# Patient Record
Sex: Male | Born: 1935
Health system: Southern US, Community
[De-identification: ages and names within clinical notes are randomized; demographics above are authoritative.]

## PROBLEM LIST (undated history)

## (undated) ENCOUNTER — Emergency Department (HOSPITAL_COMMUNITY): Admission: EM | Payer: Commercial Managed Care - HMO | Source: Home / Self Care

## (undated) DIAGNOSIS — F329 Major depressive disorder, single episode, unspecified: Secondary | ICD-10-CM

## (undated) DIAGNOSIS — C61 Malignant neoplasm of prostate: Secondary | ICD-10-CM

## (undated) DIAGNOSIS — K219 Gastro-esophageal reflux disease without esophagitis: Secondary | ICD-10-CM

## (undated) DIAGNOSIS — G20A1 Parkinson's disease without dyskinesia, without mention of fluctuations: Secondary | ICD-10-CM

## (undated) DIAGNOSIS — K562 Volvulus: Secondary | ICD-10-CM

## (undated) DIAGNOSIS — F419 Anxiety disorder, unspecified: Secondary | ICD-10-CM

## (undated) DIAGNOSIS — G20 Parkinson's disease: Secondary | ICD-10-CM

## (undated) DIAGNOSIS — E785 Hyperlipidemia, unspecified: Secondary | ICD-10-CM

## (undated) DIAGNOSIS — D352 Benign neoplasm of pituitary gland: Secondary | ICD-10-CM

## (undated) DIAGNOSIS — F0390 Unspecified dementia without behavioral disturbance: Secondary | ICD-10-CM

## (undated) DIAGNOSIS — F32A Depression, unspecified: Secondary | ICD-10-CM

## (undated) DIAGNOSIS — I2510 Atherosclerotic heart disease of native coronary artery without angina pectoris: Secondary | ICD-10-CM

## (undated) DIAGNOSIS — I2699 Other pulmonary embolism without acute cor pulmonale: Secondary | ICD-10-CM

## (undated) DIAGNOSIS — M419 Scoliosis, unspecified: Secondary | ICD-10-CM

## (undated) DIAGNOSIS — R413 Other amnesia: Secondary | ICD-10-CM

## (undated) DIAGNOSIS — I10 Essential (primary) hypertension: Secondary | ICD-10-CM

## (undated) DIAGNOSIS — I639 Cerebral infarction, unspecified: Secondary | ICD-10-CM

## (undated) HISTORY — DX: Atherosclerotic heart disease of native coronary artery without angina pectoris: I25.10

## (undated) HISTORY — DX: Other amnesia: R41.3

## (undated) HISTORY — DX: Anxiety disorder, unspecified: F41.9

## (undated) HISTORY — DX: Hyperlipidemia, unspecified: E78.5

## (undated) HISTORY — DX: Gastro-esophageal reflux disease without esophagitis: K21.9

## (undated) HISTORY — DX: Other pulmonary embolism without acute cor pulmonale: I26.99

## (undated) HISTORY — DX: Volvulus: K56.2

## (undated) HISTORY — PX: PROSTATECTOMY: SHX69

## (undated) HISTORY — DX: Unspecified dementia, unspecified severity, without behavioral disturbance, psychotic disturbance, mood disturbance, and anxiety: F03.90

## (undated) HISTORY — DX: Scoliosis, unspecified: M41.9

## (undated) HISTORY — DX: Benign neoplasm of pituitary gland: D35.2

## (undated) HISTORY — DX: Essential (primary) hypertension: I10

## (undated) HISTORY — PX: CATARACT EXTRACTION: SUR2

## (undated) HISTORY — DX: Parkinson's disease: G20

## (undated) HISTORY — DX: Parkinson's disease without dyskinesia, without mention of fluctuations: G20.A1

---

## 2004-06-01 ENCOUNTER — Encounter: Payer: Self-pay | Admitting: Cardiology

## 2004-06-24 ENCOUNTER — Encounter: Payer: Self-pay | Admitting: Cardiology

## 2004-06-29 ENCOUNTER — Inpatient Hospital Stay (HOSPITAL_BASED_OUTPATIENT_CLINIC_OR_DEPARTMENT_OTHER): Admission: RE | Admit: 2004-06-29 | Discharge: 2004-06-29 | Payer: Self-pay | Admitting: Cardiovascular Disease

## 2005-08-23 ENCOUNTER — Ambulatory Visit: Payer: Self-pay | Admitting: Cardiology

## 2007-02-12 ENCOUNTER — Ambulatory Visit: Payer: Self-pay | Admitting: Cardiology

## 2007-04-01 ENCOUNTER — Ambulatory Visit: Payer: Self-pay | Admitting: Cardiology

## 2007-04-05 ENCOUNTER — Encounter: Payer: Self-pay | Admitting: Cardiology

## 2007-04-05 ENCOUNTER — Ambulatory Visit: Payer: Self-pay | Admitting: Cardiology

## 2007-04-05 ENCOUNTER — Inpatient Hospital Stay (HOSPITAL_BASED_OUTPATIENT_CLINIC_OR_DEPARTMENT_OTHER): Admission: RE | Admit: 2007-04-05 | Discharge: 2007-04-05 | Payer: Self-pay | Admitting: Cardiology

## 2008-04-11 ENCOUNTER — Ambulatory Visit: Payer: Self-pay | Admitting: Cardiology

## 2009-09-05 ENCOUNTER — Encounter: Payer: Self-pay | Admitting: Cardiology

## 2009-09-05 ENCOUNTER — Emergency Department (HOSPITAL_COMMUNITY): Admission: EM | Admit: 2009-09-05 | Discharge: 2009-09-05 | Payer: Self-pay | Admitting: Emergency Medicine

## 2009-09-07 ENCOUNTER — Encounter: Payer: Self-pay | Admitting: Cardiology

## 2009-09-07 ENCOUNTER — Ambulatory Visit: Payer: Self-pay | Admitting: Internal Medicine

## 2009-09-07 ENCOUNTER — Inpatient Hospital Stay (HOSPITAL_COMMUNITY): Admission: EM | Admit: 2009-09-07 | Discharge: 2009-09-10 | Payer: Self-pay | Admitting: Emergency Medicine

## 2009-09-08 ENCOUNTER — Encounter: Payer: Self-pay | Admitting: Cardiology

## 2009-09-08 ENCOUNTER — Ambulatory Visit: Payer: Self-pay | Admitting: Vascular Surgery

## 2009-09-08 ENCOUNTER — Encounter (INDEPENDENT_AMBULATORY_CARE_PROVIDER_SITE_OTHER): Payer: Self-pay | Admitting: Family Medicine

## 2009-09-09 ENCOUNTER — Encounter: Payer: Self-pay | Admitting: Cardiology

## 2009-09-10 ENCOUNTER — Telehealth: Payer: Self-pay | Admitting: Internal Medicine

## 2009-09-10 ENCOUNTER — Encounter (INDEPENDENT_AMBULATORY_CARE_PROVIDER_SITE_OTHER): Payer: Self-pay | Admitting: Internal Medicine

## 2009-09-11 ENCOUNTER — Telehealth: Payer: Self-pay | Admitting: Internal Medicine

## 2009-09-12 ENCOUNTER — Encounter (INDEPENDENT_AMBULATORY_CARE_PROVIDER_SITE_OTHER): Payer: Self-pay | Admitting: Internal Medicine

## 2009-09-15 ENCOUNTER — Telehealth: Payer: Self-pay | Admitting: Internal Medicine

## 2009-09-18 ENCOUNTER — Telehealth (INDEPENDENT_AMBULATORY_CARE_PROVIDER_SITE_OTHER): Payer: Self-pay | Admitting: Pharmacist

## 2009-09-22 ENCOUNTER — Encounter: Payer: Self-pay | Admitting: Internal Medicine

## 2009-09-22 ENCOUNTER — Ambulatory Visit: Payer: Self-pay | Admitting: Internal Medicine

## 2009-09-22 LAB — CONVERTED CEMR LAB: INR: 3.3

## 2009-09-29 ENCOUNTER — Encounter: Payer: Self-pay | Admitting: Internal Medicine

## 2009-09-29 ENCOUNTER — Telehealth (INDEPENDENT_AMBULATORY_CARE_PROVIDER_SITE_OTHER): Payer: Self-pay | Admitting: Pharmacist

## 2009-10-06 ENCOUNTER — Ambulatory Visit: Payer: Self-pay | Admitting: Internal Medicine

## 2009-10-06 DIAGNOSIS — I2699 Other pulmonary embolism without acute cor pulmonale: Secondary | ICD-10-CM

## 2009-10-06 LAB — CONVERTED CEMR LAB: INR: 3.8

## 2009-10-10 ENCOUNTER — Encounter: Payer: Self-pay | Admitting: Internal Medicine

## 2009-10-15 ENCOUNTER — Ambulatory Visit: Payer: Self-pay | Admitting: Internal Medicine

## 2009-10-15 DIAGNOSIS — K219 Gastro-esophageal reflux disease without esophagitis: Secondary | ICD-10-CM

## 2009-10-15 DIAGNOSIS — E785 Hyperlipidemia, unspecified: Secondary | ICD-10-CM

## 2009-10-15 DIAGNOSIS — I10 Essential (primary) hypertension: Secondary | ICD-10-CM | POA: Insufficient documentation

## 2009-10-16 ENCOUNTER — Telehealth (INDEPENDENT_AMBULATORY_CARE_PROVIDER_SITE_OTHER): Payer: Self-pay | Admitting: Pharmacist

## 2009-10-27 ENCOUNTER — Ambulatory Visit: Payer: Self-pay | Admitting: Internal Medicine

## 2009-10-27 LAB — CONVERTED CEMR LAB: INR: 2.6

## 2009-10-31 ENCOUNTER — Telehealth (INDEPENDENT_AMBULATORY_CARE_PROVIDER_SITE_OTHER): Payer: Self-pay | Admitting: *Deleted

## 2009-11-17 ENCOUNTER — Ambulatory Visit: Payer: Self-pay | Admitting: Internal Medicine

## 2009-11-17 LAB — CONVERTED CEMR LAB: INR: 2.6

## 2009-12-15 ENCOUNTER — Ambulatory Visit: Payer: Self-pay | Admitting: Infectious Diseases

## 2009-12-15 LAB — CONVERTED CEMR LAB: INR: 2.4

## 2010-01-13 ENCOUNTER — Encounter (INDEPENDENT_AMBULATORY_CARE_PROVIDER_SITE_OTHER): Payer: Self-pay | Admitting: *Deleted

## 2010-01-13 DIAGNOSIS — R002 Palpitations: Secondary | ICD-10-CM | POA: Insufficient documentation

## 2010-01-15 ENCOUNTER — Ambulatory Visit: Payer: Self-pay | Admitting: Cardiology

## 2010-01-15 ENCOUNTER — Inpatient Hospital Stay (HOSPITAL_COMMUNITY): Admission: EM | Admit: 2010-01-15 | Discharge: 2010-01-17 | Payer: Self-pay | Admitting: Emergency Medicine

## 2010-01-15 ENCOUNTER — Encounter: Payer: Self-pay | Admitting: *Deleted

## 2010-01-15 ENCOUNTER — Ambulatory Visit: Payer: Self-pay | Admitting: Infectious Diseases

## 2010-01-15 ENCOUNTER — Ambulatory Visit: Payer: Self-pay | Admitting: Internal Medicine

## 2010-01-15 ENCOUNTER — Encounter (INDEPENDENT_AMBULATORY_CARE_PROVIDER_SITE_OTHER): Payer: Self-pay | Admitting: Internal Medicine

## 2010-01-16 ENCOUNTER — Encounter (INDEPENDENT_AMBULATORY_CARE_PROVIDER_SITE_OTHER): Payer: Self-pay | Admitting: Pulmonary Disease

## 2010-01-16 ENCOUNTER — Encounter (INDEPENDENT_AMBULATORY_CARE_PROVIDER_SITE_OTHER): Payer: Self-pay | Admitting: Internal Medicine

## 2010-01-17 ENCOUNTER — Encounter: Payer: Self-pay | Admitting: Internal Medicine

## 2010-01-20 ENCOUNTER — Telehealth: Payer: Self-pay | Admitting: *Deleted

## 2010-01-21 ENCOUNTER — Ambulatory Visit: Payer: Self-pay | Admitting: Internal Medicine

## 2010-01-21 LAB — CONVERTED CEMR LAB: INR: 1.8

## 2010-01-26 ENCOUNTER — Ambulatory Visit: Payer: Self-pay | Admitting: Internal Medicine

## 2010-01-26 LAB — CONVERTED CEMR LAB: INR: 2.1

## 2010-02-02 ENCOUNTER — Ambulatory Visit: Payer: Self-pay | Admitting: Infectious Disease

## 2010-02-02 LAB — CONVERTED CEMR LAB: INR: 4.3

## 2010-02-11 ENCOUNTER — Ambulatory Visit: Payer: Self-pay | Admitting: Internal Medicine

## 2010-02-11 LAB — CONVERTED CEMR LAB: INR: 2.9

## 2010-02-12 ENCOUNTER — Encounter: Payer: Self-pay | Admitting: Pharmacist

## 2010-02-12 LAB — CONVERTED CEMR LAB: INR: 2.9

## 2010-02-23 ENCOUNTER — Ambulatory Visit: Payer: Self-pay | Admitting: Internal Medicine

## 2010-02-23 LAB — CONVERTED CEMR LAB: INR: 3.1

## 2010-07-10 ENCOUNTER — Inpatient Hospital Stay (HOSPITAL_COMMUNITY): Admission: EM | Admit: 2010-07-10 | Discharge: 2010-07-13 | Payer: Self-pay | Admitting: Emergency Medicine

## 2010-07-12 ENCOUNTER — Ambulatory Visit: Payer: Self-pay | Admitting: Vascular Surgery

## 2010-07-12 ENCOUNTER — Encounter (INDEPENDENT_AMBULATORY_CARE_PROVIDER_SITE_OTHER): Payer: Self-pay | Admitting: Internal Medicine

## 2010-07-15 ENCOUNTER — Telehealth: Payer: Self-pay | Admitting: Internal Medicine

## 2010-07-15 ENCOUNTER — Encounter: Payer: Self-pay | Admitting: Internal Medicine

## 2010-07-15 ENCOUNTER — Ambulatory Visit: Payer: Self-pay | Admitting: Internal Medicine

## 2010-07-15 LAB — CONVERTED CEMR LAB: INR: 2.3

## 2010-07-20 ENCOUNTER — Ambulatory Visit: Payer: Self-pay | Admitting: Internal Medicine

## 2010-07-20 LAB — CONVERTED CEMR LAB: INR: 4.1

## 2010-07-27 ENCOUNTER — Ambulatory Visit: Payer: Self-pay | Admitting: Internal Medicine

## 2010-07-27 LAB — CONVERTED CEMR LAB: INR: 2

## 2010-08-06 ENCOUNTER — Ambulatory Visit: Payer: Self-pay | Admitting: Internal Medicine

## 2010-08-06 DIAGNOSIS — M1990 Unspecified osteoarthritis, unspecified site: Secondary | ICD-10-CM

## 2010-08-06 DIAGNOSIS — Z8546 Personal history of malignant neoplasm of prostate: Secondary | ICD-10-CM

## 2010-08-19 ENCOUNTER — Telehealth: Payer: Self-pay | Admitting: Pharmacist

## 2010-08-24 ENCOUNTER — Ambulatory Visit: Payer: Self-pay | Admitting: Internal Medicine

## 2010-08-24 LAB — CONVERTED CEMR LAB: INR: 1.6

## 2010-08-25 ENCOUNTER — Telehealth (INDEPENDENT_AMBULATORY_CARE_PROVIDER_SITE_OTHER): Payer: Self-pay | Admitting: Pharmacist

## 2010-08-31 ENCOUNTER — Ambulatory Visit: Payer: Self-pay | Admitting: Internal Medicine

## 2010-08-31 LAB — CONVERTED CEMR LAB: INR: 1.5

## 2010-09-02 ENCOUNTER — Ambulatory Visit: Payer: Self-pay

## 2010-09-18 ENCOUNTER — Encounter: Payer: Self-pay | Admitting: Pharmacist

## 2010-09-18 DIAGNOSIS — Z7902 Long term (current) use of antithrombotics/antiplatelets: Secondary | ICD-10-CM | POA: Insufficient documentation

## 2010-09-21 ENCOUNTER — Ambulatory Visit: Payer: Self-pay | Admitting: Internal Medicine

## 2010-09-24 ENCOUNTER — Emergency Department (HOSPITAL_COMMUNITY)
Admission: EM | Admit: 2010-09-24 | Discharge: 2010-09-24 | Payer: Self-pay | Source: Home / Self Care | Admitting: Emergency Medicine

## 2010-09-24 ENCOUNTER — Telehealth (INDEPENDENT_AMBULATORY_CARE_PROVIDER_SITE_OTHER): Payer: Self-pay | Admitting: *Deleted

## 2010-09-24 LAB — POCT CARDIAC MARKERS
CKMB, poc: 5.6 ng/mL (ref 1.0–8.0)
Myoglobin, poc: 212 ng/mL (ref 12–200)
Troponin i, poc: <0.05 ng/mL (ref 0.00–0.09)

## 2010-09-24 LAB — COMPREHENSIVE METABOLIC PANEL
ALT: 27 U/L (ref 0–53)
AST: 38 U/L — ABNORMAL HIGH (ref 0–37)
Albumin: 3.6 g/dL (ref 3.5–5.2)
Alkaline Phosphatase: 41 U/L (ref 39–117)
BUN: 14 mg/dL (ref 6–23)
CO2: 27 mEq/L (ref 19–32)
Calcium: 9.3 mg/dL (ref 8.4–10.5)
Chloride: 105 mEq/L (ref 96–112)
Creatinine, Ser: 1.07 mg/dL (ref 0.4–1.5)
GFR calc Af Amer: >60 mL/min (ref >60)
GFR calc non Af Amer: >60 mL/min (ref >60)
Glucose, Bld: 85 mg/dL (ref 70–99)
Potassium: 3.9 mEq/L (ref 3.5–5.1)
Sodium: 139 mEq/L (ref 135–145)
Total Bilirubin: 0.6 mg/dL (ref 0.3–1.2)
Total Protein: 7.5 g/dL (ref 6.0–8.3)

## 2010-09-24 LAB — CBC
HCT: 40.3 % (ref 39.0–52.0)
Hemoglobin: 13 g/dL (ref 13.0–17.0)
MCH: 29.7 pg (ref 26.0–34.0)
MCHC: 32.3 g/dL (ref 30.0–36.0)
MCV: 92 fL (ref 78.0–100.0)
Platelets: 200 10*3/uL (ref 150–400)
RBC: 4.38 MIL/uL (ref 4.22–5.81)
RDW: 14.8 % (ref 11.5–15.5)
WBC: 3.3 10*3/uL — ABNORMAL LOW (ref 4.0–10.5)

## 2010-09-24 LAB — DIFFERENTIAL
Basophils Absolute: 0 10*3/uL (ref 0.0–0.1)
Basophils Relative: 1 % (ref 0–1)
Eosinophils Absolute: 0.4 10*3/uL (ref 0.0–0.7)
Eosinophils Relative: 12 % — ABNORMAL HIGH (ref 0–5)
Lymphocytes Relative: 41 % (ref 12–46)
Lymphs Abs: 1.4 10*3/uL (ref 0.7–4.0)
Monocytes Absolute: 0.3 10*3/uL (ref 0.1–1.0)
Monocytes Relative: 9 % (ref 3–12)
Neutro Abs: 1.2 10*3/uL — ABNORMAL LOW (ref 1.7–7.7)
Neutrophils Relative %: 37 % — ABNORMAL LOW (ref 43–77)

## 2010-09-24 LAB — PROTIME-INR
INR: 1.6 — ABNORMAL HIGH (ref 0.00–1.49)
Prothrombin Time: 19.2 seconds — ABNORMAL HIGH (ref 11.6–15.2)

## 2010-10-06 DIAGNOSIS — Z7901 Long term (current) use of anticoagulants: Secondary | ICD-10-CM

## 2010-10-06 DIAGNOSIS — Z7902 Long term (current) use of antithrombotics/antiplatelets: Secondary | ICD-10-CM

## 2010-10-06 DIAGNOSIS — I2699 Other pulmonary embolism without acute cor pulmonale: Secondary | ICD-10-CM

## 2010-10-06 NOTE — Progress Notes (Signed)
Addended by: [name] on: 10/06/2010 12:37 PM   Modules accepted: Orders

## 2010-10-08 NOTE — Progress Notes (Signed)
Addended by: Glenetta Borg on: 10/08/2010 11:15 AM   Modules accepted: Orders

## 2010-10-09 NOTE — Progress Notes (Signed)
Addended by: [name] on: 10/09/2010 08:08 AM   Modules accepted: Orders

## 2010-10-12 ENCOUNTER — Ambulatory Visit: Admission: RE | Admit: 2010-10-12 | Discharge: 2010-10-12 | Payer: Self-pay | Source: Home / Self Care

## 2010-10-12 LAB — CONVERTED CEMR LAB: INR: 2.2

## 2010-10-20 NOTE — Miscellaneous (Signed)
Summary: Orders Update  Clinical Lists Changes  Problems: Added new problem of PALPITATIONS (ICD-785.1) Orders: Added new Referral order of Holter Monitor (Holter Monitor) - Signed 

## 2010-10-20 NOTE — Miscellaneous (Signed)
Summary: HIPAA Restrictions  HIPAA Restrictions   Imported By: Florinda Marker 09/22/2009 16:31:19  _____________________________________________________________________  External Attachment:    Type:   Image     Comment:   External Document

## 2010-10-20 NOTE — Progress Notes (Signed)
Summary: Coumadin  Phone Note From Other Clinic   Caller: Nurse Summary of Call: Call from Alexis Frock pt's INR 2.6 .  Pt takes 7.5 mg on Tuesday and Thursday and 5 mg the other days.   No signs of bleeding. Call back [phone]. Initial call taken by: Angelina Ok RN,  September 29, 2009 4:07 PM  Follow-up for Phone Call        Phone call completed, Provider notified Follow-up by: [name] PharmD CACP

## 2010-10-20 NOTE — Assessment & Plan Note (Signed)
Summary: COU/VS  Anticoagulant Therapy Managed by: Barbera Setters. Alexandria Lodge [name]  PharmD CACP [facility] Attending: Darl Pikes, Beth Indication 1: Pulmonary  embolus Indication 2: Encounter for therapeutic drug monitoring  V58.83 Start date: 09/07/2009 Duration: 1 year  Patient Assessment Reviewed by: Chancy Milroy PharmD  October 06, 2009 Medication review: verified warfarin dosage & schedule,verified previous prescription medications, verified doses & any changes, verified new medications, reviewed OTC medications, reviewed OTC health products-vitamins supplements etc Complications: none Dietary changes: none   Health status changes: none   Lifestyle changes: none   Recent/future hospitalizations: none   Recent/future procedures: none   Recent/future dental: none Patient Assessment Part 2:  Have you MISSED ANY DOSES or CHANGED TABLETS?  No missed Warfarin doses or changed tablets.  Have you had any BRUISING or BLEEDING ( nose or gum bleeds,blood in urine or stool)?  No reported bruising or bleeding in nose, gums, urine, stool.  Have you STARTED or STOPPED any MEDICATIONS, including OTC meds,herbals or supplements?  No other medications or herbal supplements were started or stopped.  Have you CHANGED your DIET, especially green vegetables,or ALCOHOL intake?  No changes in diet or alcohol intake.  Have you had any ILLNESSES or HOSPITALIZATIONS?  No reported illnesses or hospitalizations  Have you had any signs of CLOTTING?(chest discomfort,dizziness,shortness of breath,arms tingling,slurred speech,swelling or redness in leg)    No chest discomfort, dizziness, shortness of breath, tingling in arm, slurred speech, swelling, or redness in leg.     Treatment  Target INR: 2.0-3.0 INR: 3.8  Date: 10/06/2009 Regimen In:  40.0mg /week INR reflects regimen in: 3.8  New  Tablet strength: : 5mg  Regimen Out:     Sunday: 1 Tablet     Monday: 1 Tablet     Tuesday: 1 Tablet     Wednesday: 1  Tablet     Thursday: 1 Tablet      Friday: 1 Tablet     Saturday: 1 Tablet Total Weekly: 35.0mg /week mg  Next INR Due: 10/27/2009 Adjusted by: Barbera Setters. Alexandria Lodge [name] PharmD CACP   Return to anticoagulation clinic:  10/27/2009 Time of next visit: 1630

## 2010-10-20 NOTE — Assessment & Plan Note (Signed)
Summary: COU/VS  Anticoagulant Therapy Managed by: Barbera Setters. Janie Morning  PharmD CACP PCP: [name] MD [facility] Attending: Rogelia Boga MD, Lanora Manis Indication 1: Pulmonary  embolus Indication 2: Encounter for therapeutic drug monitoring  V58.83 Start date: 09/07/2009 Duration: 1 year  Patient Assessment Reviewed by: Chancy Milroy PharmD  December 15, 2009 Medication review: verified warfarin dosage & schedule,verified previous prescription medications, verified doses & any changes, verified new medications, reviewed OTC medications, reviewed OTC health products-vitamins supplements etc Complications: none Dietary changes: none   Health status changes: none   Lifestyle changes: none   Recent/future hospitalizations: none   Recent/future procedures: none   Recent/future dental: none Patient Assessment Part 2:  Have you MISSED ANY DOSES or CHANGED TABLETS?  No missed Warfarin doses or changed tablets.  Have you had any BRUISING or BLEEDING ( nose or gum bleeds,blood in urine or stool)?  No reported bruising or bleeding in nose, gums, urine, stool.  Have you STARTED or STOPPED any MEDICATIONS, including OTC meds,herbals or supplements?  No other medications or herbal supplements were started or stopped.  Have you CHANGED your DIET, especially green vegetables,or ALCOHOL intake?  No changes in diet or alcohol intake.  Have you had any ILLNESSES or HOSPITALIZATIONS?  No reported illnesses or hospitalizations  Have you had any signs of CLOTTING?(chest discomfort,dizziness,shortness of breath,arms tingling,slurred speech,swelling or redness in leg)    No chest discomfort, dizziness, shortness of breath, tingling in arm, slurred speech, swelling, or redness in leg.     Treatment  Target INR: 2.0-3.0 INR: 2.4  Date: 12/15/2009 Regimen In:  40.0mg /week INR reflects regimen in: 2.4  New  Tablet strength: : 5mg  Regimen Out:     Sunday: 1 Tablet     Monday: 1 & 1/2 Tablet     Tuesday: 1  Tablet     Wednesday: 1 Tablet     Thursday: 1 & 1/2 Tablet      Friday: 1 Tablet     Saturday: 1 Tablet Total Weekly: 40.0mg /week mg  Next INR Due: 01/12/2010 Adjusted by: Barbera Setters. Alexandria Lodge [name] PharmD CACP   Return to anticoagulation clinic:  01/12/2010 Time of next visit: 1600    Allergies: No Known Drug Allergies

## 2010-10-20 NOTE — Progress Notes (Signed)
  Phone Note From Other Clinic   Caller: Nurse Call For: [name] PharmD CACP Reason for Call: Schedule Patient Appt, Medication Check, Diagnosis Check Request: Talk with Provider Action Taken: Phone Call Completed, Provider Notified Details for Reason: [facility] RN called (she has called Angelina Ok RN within our clinic as well), indicating INR = 2.7 on 1 and 1/2 x 5mg  (7.5mg ) warfarin on TWO days of week and 1x5mg  on all other days of the week with no bleeding complications noted upon her visit.  Details of Complaint: No complaints. Details of Action Taken: [facility] RN was advised to instruct the patient to CONTINUE this same regimen and the [facility] RN will re-collect an INR in 2 weeks.  Summary of Call: Telemanagment of home warfarin therapy. Initial call taken by: [name] PharmD CACP

## 2010-10-20 NOTE — Cardiovascular Report (Signed)
Summary: Cardiac Catheterization  Cardiac Catheterization   Imported By: Dorise Hiss 01/14/2010 15:23:27  _____________________________________________________________________  External Attachment:    Type:   Image     Comment:   External Document

## 2010-10-20 NOTE — Assessment & Plan Note (Signed)
Summary: HFU-CK/FU/MEDS/CFB   Vital Signs:  Patient profile:   77 year old male Height:      68.5 inches Weight:      174.7 pounds BMI:     26.27 Temp:     97.2 degrees F oral Pulse rate:   68 / minute BP sitting:   128 / 86  (right arm)  Vitals Entered By: Filomena Jungling NT II (Feb 11, 2010 2:04 PM) CC: HFU  ?WHEN CAN HE COME OFF CUMADINE/ PATIENT DID NOT GET PAIN MED REFILL Is Patient Diabetic? No Pain Assessment Patient in pain? yes     Location: RIBS Intensity: 3 Type: aching Onset of pain  2005  Have you ever been in a relationship where you felt threatened, hurt or afraid?No   Does patient need assistance? Functional Status Self care Ambulation Normal   Primary Care Provider:  [name] MD  CC:  HFU  ?WHEN CAN HE COME OFF CUMADINE/ PATIENT DID NOT GET PAIN MED REFILL.  History of Present Illness: [name] is in the clinic for HFU after experiencing stroke like symptoms. His symptoms have completely resolved. He has no new symptoms. We talked at length about his exertion. He cares for his wife who has dementia. He hardly sleeps through the night. He feels this is a hard situation which does not have many solutions. He is not ready to let his wife go to nursing home.  He wonders if he needs to be on coumiding and whether he can take Loratab prescribed by other doctor.   Preventive Screening-Counseling & Management  Alcohol-Tobacco     Smoking Status: quit  Current Medications (verified): 1)  Lisinopril 40 Mg Tabs (Lisinopril) .... Take 1 Tab Twice Daily 2)  Norvasc 10 Mg Tabs (Amlodipine Besylate) .[name].. 1 Tab Daily 3)  Prilosec 20 Mg Cpdr (Omeprazole) .... Take 1 Tab Daily 4)  Alprazolam 0.5 Mg Tabs (Alprazolam) .... Take 1 Tab Twice Daily As Needed 5)  Coumadin 5 Mg Tabs (Warfarin Sodium) .... As Instructed Per Pharmd 6)  Aspir-Low 81 Mg Tbec (Aspirin) .... Take 2 Tablets By Mouth Once A Day  Allergies (verified): No Known Drug Allergies  Past  History:  Social History: Last updated: 10/15/2009 Retired Married Former Smoker and quitted. Alcohol use-no Drug use-no  Risk Factors: Smoking Status: quit (02/11/2010)  Review of Systems      See HPI  Physical Exam  General:  alert, well-developed, well-nourished, and well-hydrated.   Head:  normocephalic.   Ears:  no external deformities.   Nose:  no external erythema and no nasal discharge.   Mouth:  pharynx pink and moist.   Neck:  supple.   Chest Wall:  no deformities.   Lungs:  normal respiratory effort, no accessory muscle use, normal breath sounds, no crackles, and no wheezes.   Heart:  normal rate, regular rhythm, no murmur, no gallop, and no JVD.   Abdomen:  soft, non-tender, normal bowel sounds, no distention, and no masses.   Msk:  normal ROM, no joint tenderness, no joint swelling, no joint warmth, and no redness over joints.   Neurologic:  alert & oriented X3, cranial nerves II-XII intact, strength normal in all extremities, sensation intact to light touch, and gait normal.     Impression & Recommendations:  Problem # 1:  ESSENTIAL HYPERTENSION, BENIGN (ICD-401.1) BP well controlled. NO changes made.  His updated medication list for this problem includes:    Lisinopril 40 Mg Tabs (Lisinopril) .[name]... Take 1 tab  twice daily    Norvasc 10 Mg Tabs (Amlodipine besylate) .[name]... 1 tab daily  BP today: 128/86 Prior BP: 123/78 (01/15/2010)  Problem # 2:  LONG-TERM USE OF ANTIPLATELET/ANTITHROMBOTIC (ICD-V58.63) discussed at length about coumidin use post non provoked PE. He can come off of the coumidin as he is six months post. He is agreeable. I told him he always will be little higher risk due to past PE but it may not be worth stayin on coumidin chronically. Patient voices understanding.  His INR was elevated on last check so he wants it to be checked again.  Orders: [facility] (in-house) [phone])  Problem # 3:  GERD (ICD-530.81) stable. no new symptoms.  His  updated medication list for this problem includes:    Prilosec 20 Mg Cpdr (Omeprazole) .[name]... 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 This was unprovoked. See discussion in anticoagulation problem for further details.  His updated medication list for this problem includes:    Coumadin 5 Mg Tabs (Warfarin sodium) .[name]... As instructed per pharmd    Aspir-low 81 Mg Tbec (Aspirin) .[name]... Take 2 tablets by mouth once a day  Complete Medication List: 1)  Lisinopril 40 Mg Tabs (Lisinopril) .... Take 1 tab twice daily 2)  Norvasc 10 Mg Tabs (Amlodipine besylate) .[name].. 1 tab daily 3)  Prilosec 20 Mg Cpdr (Omeprazole) .... Take 1 tab daily 4)  Alprazolam 0.5 Mg Tabs (Alprazolam) .... Take 1 tab twice daily as needed 5)  Coumadin 5 Mg Tabs (Warfarin sodium) .... As instructed per pharmd 6)  Aspir-low 81 Mg Tbec (Aspirin) .... Take 2 tablets by mouth once a day  Patient Instructions: 1)  Please schedule a follow-up appointment in 1 month.  Prevention & Chronic Care Immunizations   Influenza vaccine: Not documented   Influenza vaccine deferral: Deferred  (10/15/2009)    Tetanus booster: Not documented    Pneumococcal vaccine: Not documented   Pneumococcal vaccine deferral: Deferred  (10/15/2009)    H. zoster vaccine: Not documented  Colorectal Screening   Hemoccult: Not documented    Colonoscopy: Not documented   Colonoscopy action/deferral: Deferred  (10/15/2009)  Other Screening   PSA: Not documented   Smoking status: quit  (02/11/2010)  Lipids   Total Cholesterol: Not documented   LDL: Not documented   LDL Direct: Not documented   HDL: Not documented   Triglycerides: Not documented    SGOT (AST): Not documented   SGPT (ALT): Not documented   Alkaline phosphatase: Not documented   Total bilirubin: Not documented  Hypertension   Last Blood Pressure: 128 / 86  (02/11/2010)   Serum creatinine: Not documented   Serum potassium Not  documented  Self-Management Support :    Patient will work on the following items until the next clinic visit to reach self-care goals:     Medications and monitoring: take my medicines every day  (02/11/2010)     Eating: use fresh or frozen vegetables, eat foods that are low in salt, eat baked foods instead of fried foods, eat fruit for snacks and desserts  (02/11/2010)    Hypertension self-management support: Education handout, Resources for patients handout  (02/11/2010)   Hypertension education handout printed    Lipid self-management support: Education handout, Resources for patients handout  (02/11/2010)     Lipid education handout printed      Resource handout printed.     Laboratory Results   Blood Tests   Date/Time  Received: Feb 11, 2010 3:34 PM  Date/Time Reported: Burke Keels  Feb 11, 2010 3:34 PM    INR: 2.9   (Normal Range: 0.88-1.12   Therap INR: 2.0-3.5)

## 2010-10-20 NOTE — Assessment & Plan Note (Signed)
Summary: 261/ds  Anticoagulant Therapy Managed by: Barbera Setters. Janie Morning  PharmD CACP PCP: [name] MD [facility] Attending: [name] MD, [name] Indication 1: Pulmonary  embolus Indication 2: Encounter for therapeutic drug monitoring  V58.83 Start date: 09/07/2009 Duration: 6 months  Patient Assessment Reviewed by: Chancy Milroy PharmD  February 23, 2010 Medication review: verified warfarin dosage & schedule,verified previous prescription medications, verified doses & any changes, verified new medications, reviewed OTC medications, reviewed OTC health products-vitamins supplements etc Complications: none Dietary changes: none   Health status changes: none   Lifestyle changes: none   Recent/future hospitalizations: none   Recent/future procedures: none   Recent/future dental: none Patient Assessment Part 2:  Have you MISSED ANY DOSES or CHANGED TABLETS?  No missed Warfarin doses or changed tablets.  Have you had any BRUISING or BLEEDING ( nose or gum bleeds,blood in urine or stool)?  No reported bruising or bleeding in nose, gums, urine, stool.  Have you STARTED or STOPPED any MEDICATIONS, including OTC meds,herbals or supplements?  No other medications or herbal supplements were started or stopped.  Have you CHANGED your DIET, especially green vegetables,or ALCOHOL intake?  No changes in diet or alcohol intake.  Have you had any ILLNESSES or HOSPITALIZATIONS?  No reported illnesses or hospitalizations  Have you had any signs of CLOTTING?(chest discomfort,dizziness,shortness of breath,arms tingling,slurred speech,swelling or redness in leg)    No chest discomfort, dizziness, shortness of breath, tingling in arm, slurred speech, swelling, or redness in leg.     Treatment  Target INR: 2.0-3.0 INR: 3.1  Date: 02/23/2010 Regimen In:  42.5mg /week INR reflects regimen in: 3.1  New  Tablet strength: : 5mg      Comments: Warfarin to be discontinued based upon informed decision of   PCP and patient from last visit. Today is the last scheduled visit. Patient was instructed regarding signs/symptoms of recurrence of VTED and voices his understanding and requirement to see his healthcare provider or ED if there were to be a recurrence of VTE.  Allergies: No Known Drug Allergies

## 2010-10-20 NOTE — Progress Notes (Signed)
Summary: phone/gg  Phone Note Call from Patient   Complaint: Chest Pain Summary of Call: Call from [facility] with [facility] stating she was going out to evaluate pt for home health.  She was to do PT/INR but pt is not home bound so she can't admit him to there service.  She wants to know what to do about labs she was to draw.  Pt is on lovenox  120 mg q 24 hours for 3 days.  On 10/24 INR  1.52  it was to be checked again today but home health will not do it. Pt was in [facility] but I don't see notes.  Treated by Hospitalist but has f/u with Dr Scot Dock on the 31st.    What do you want to do about the labs? Initial call taken by: Merrie Roof RN,  July 15, 2010 12:15 PM  Follow-up for Phone Call        This was second PE. Needs tx for acute PE and therefore appropriate anticoag testing. He needs to cont lovenox until his INR is therapeutic for at least three days. Last INR was the 24th. He needs an INR either through home health or via Dr Alexandria Lodge (since Dr Alexandria Lodge not here in [facility] today, I would interpret INR if done today). So I don't really who of how things are done but he needs 1. Cont Lovenox until told to D/C 2. INR today / tomorrow and then repeated based on INR results 3. Cont Coumadin. Follow-up by: Blanch Media MD,  July 15, 2010 12:54 PM  Additional Follow-up for Phone Call Additional follow up Details #1::        Pt is here now for labs.  Will you talk with him and give him instructions?  Dr has appointment with Dr Alexandria Lodge on Monday. Additional Follow-up by: Merrie Roof RN,  July 15, 2010 1:44 PM    Additional Follow-up for Phone Call Additional follow up Details #2::    INR 2.3 Asked pt to cont Lovenox (has enough till Fri) and coumadin 7.5 He will return 10/26 for repeat INR. I am conerned bc he stated I have enough coumadin bc I had some left over (meaning prior PE) last rx for warfarin was 5 mg and he was D/C'd on 7.5. Therefore that is why I am being cautious  and asking him to return on Fri for INR. Follow-up by: Blanch Media MD,  July 15, 2010 2:02 PM   Laboratory Results   Blood Tests   Date/Time Received: July 15, 2010 1:56 PM Date/Time Reported: Burke Keels  July 15, 2010 1:56 PM     INR: 2.3   (Normal Range: 0.88-1.12   Therap INR: 2.0-3.5)

## 2010-10-20 NOTE — Assessment & Plan Note (Signed)
Summary: Coumadin Clinic  Anticoagulant Therapy Managed by: Barbera Setters. Janie Morning  PharmD CACP PCP: [name] MD [facility] Attending: Margarito Liner MD Indication 1: Pulmonary  embolus Indication 2: Encounter for therapeutic drug monitoring  V58.83 Start date: 09/07/2009 Duration: 6 months  Patient Assessment Reviewed by: Chancy Milroy PharmD  Feb 12, 2010 Medication review: verified warfarin dosage & schedule,verified previous prescription medications, verified doses & any changes, verified new medications, reviewed OTC medications, reviewed OTC health products-vitamins supplements etc Complications: none Dietary changes: none   Health status changes: none   Lifestyle changes: none   Recent/future hospitalizations: none   Recent/future procedures: none   Recent/future dental: none Patient Assessment Part 2:  Have you MISSED ANY DOSES or CHANGED TABLETS?  No missed Warfarin doses or changed tablets.  Have you had any BRUISING or BLEEDING ( nose or gum bleeds,blood in urine or stool)?  No reported bruising or bleeding in nose, gums, urine, stool.  Have you STARTED or STOPPED any MEDICATIONS, including OTC meds,herbals or supplements?  No other medications or herbal supplements were started or stopped.  Have you CHANGED your DIET, especially green vegetables,or ALCOHOL intake?  No changes in diet or alcohol intake.  Have you had any ILLNESSES or HOSPITALIZATIONS?  No reported illnesses or hospitalizations  Have you had any signs of CLOTTING?(chest discomfort,dizziness,shortness of breath,arms tingling,slurred speech,swelling or redness in leg)    No chest discomfort, dizziness, shortness of breath, tingling in arm, slurred speech, swelling, or redness in leg.     Treatment  Target INR: 2.0-3.0 INR: 2.9  Date: 02/12/2010 Regimen In:  42.5mg /week INR reflects regimen in: 2.9  New  Tablet strength: : 5mg  Adjusted by: Barbera Setters. Alexandria Lodge [name] PharmD CACP     Comments: Have reviewed  notes of 25-May-11 by Dr. Sherryll Burger. Per Physician and patient joint informed decision, they elect to discontinue warfarin at approximately 6 months status-post index VTE. Patient is aware and will discontinue warfarin. Patient has been archived in our stand-alone anticoagulation management software tracking database (DoseResponse).   Allergies: No Known Drug Allergies

## 2010-10-20 NOTE — Assessment & Plan Note (Signed)
Summary: EST-BACK PAIN/CFB   Vital Signs:  Patient profile:   77 year old male Height:      68.5 inches Weight:      177.0 pounds BMI:     26.62 Temp:     97.6 degrees F oral Pulse rate:   61 / minute BP sitting:   141 / 91  (right arm)  Vitals Entered By: Filomena Jungling NT II (August 06, 2010 4:07 PM) CC: Started feeling bad yesterday,hurts when tak a deep breathe Is Patient Diabetic? No Pain Assessment Patient in pain? no       Have you ever been in a relationship where you felt threatened, hurt or afraid?No   Does patient need assistance? Functional Status Self care Ambulation Normal   Primary Care Provider:  [name] MD  CC:  Started feeling bad yesterday and hurts when tak a deep breathe.  History of Present Illness: Pt is a 77 yo AAM with PMH of recurrent PE, HTN, HLD who came here for f/u his back pain. Yesterday after working hard to care of his wife with dementia, he had back pain, about 5/10, no radiation, movement makes it worse. Today he found the back pain is much better, about 1/10. He has no other c/o, including sOB, CP of melena, hematochezia.   Preventive Screening-Counseling & Management  Alcohol-Tobacco     Smoking Status: quit  Problems Prior to Update: 1)  Palpitations  (ICD-785.1) 2)  Gerd  (ICD-530.81) 3)  Hyperlipidemia  (ICD-272.4) 4)  Essential Hypertension, Benign  (ICD-401.1) 5)  Long-term Use of Antiplatelet/antithrombotic  (ICD-V58.63) 6)  Pe  (ICD-415.19)  Medications Prior to Update: 1)  Lisinopril 40 Mg Tabs (Lisinopril) .... Take 1 Tab Twice Daily 2)  Norvasc 10 Mg Tabs (Amlodipine Besylate) .[name].. 1 Tab Daily 3)  Alprazolam 0.5 Mg Tabs (Alprazolam) .... Take 1 Tab Twice Daily As Needed 4)  Coumadin 5 Mg Tabs (Warfarin Sodium) .... As Instructed Per Pharmd 5)  Aspir-Low 81 Mg Tbec (Aspirin) .... Take 2 Tablets By Mouth Once A Day 6)  Simvastatin 40 Mg Tabs (Simvastatin) .... Take 1 Tablet By Mouth Once A Day  Current  Medications (verified): 1)  Lisinopril 40 Mg Tabs (Lisinopril) .... Take 1 Tab Twice Daily 2)  Norvasc 10 Mg Tabs (Amlodipine Besylate) .[name].. 1 Tab Daily 3)  Alprazolam 0.5 Mg Tabs (Alprazolam) .... Take 1 Tab Twice Daily As Needed 4)  Coumadin 5 Mg Tabs (Warfarin Sodium) .... As Instructed Per Pharmd 5)  Aspir-Low 81 Mg Tbec (Aspirin) .... Take 2 Tablets By Mouth Once A Day 6)  Simvastatin 40 Mg Tabs (Simvastatin) .... Take 1 Tablet By Mouth Once A Day  Allergies (verified): No Known Drug Allergies  Past History:  Past Medical History: Last updated: 07/15/2010 PE 09/07/09     Per records unprovoked     Completed 6 months warfarin     Warfarin D/C'd 02/12/10  PE 07/10/10     Provoked - long car ride     Hypercoag panel negative except for low functional Protein C (done during acute PE)  HTN  CVA, H/O     MRI 12/10 negative except small vessel changes     CT 4/11 old R thalamic infarct  Pituitary microadenoma     Incidental finding CT 4/11  Social History: Last updated: 10/15/2009 Retired Married Former Smoker and quitted. Alcohol use-no Drug use-no  Risk Factors: Smoking Status: quit (08/06/2010)  Social History: Reviewed history from 10/15/2009 and no changes required. Retired  Married Former Games developer and quitted. Alcohol use-no Drug use-no  Review of Systems  The patient denies fever, vision loss, chest pain, syncope, dyspnea on exertion, peripheral edema, prolonged cough, hemoptysis, abdominal pain, melena, and hematochezia.    Physical Exam  General:  alert, well-developed, well-nourished, and well-hydrated.   Nose:  no nasal discharge.   Mouth:  pharynx pink and moist.   Neck:  supple.   Lungs:  normal respiratory effort, normal breath sounds, no crackles, and no wheezes.   Heart:  normal rate, regular rhythm, and no murmur.   Abdomen:  soft, non-tender, normal bowel sounds, no distention, and no masses.   Msk:  normal ROM, no joint tenderness, no  joint swelling, and no joint warmth.  Lower back muscle tenderness, no redness or swelling.  Extremities:  no edema.  Neurologic:  alert & oriented X3, cranial nerves II-XII intact, strength normal in all extremities, and gait normal.     Impression & Recommendations:  Problem # 1:  ESSENTIAL HYPERTENSION, BENIGN (ICD-401.1) Assessment Unchanged Stable and will continue current meds. Will recheck CMET at next visit.  His updated medication list for this problem includes:    Lisinopril 40 Mg Tabs (Lisinopril) .[name]... Take 1 tab twice daily    Norvasc 10 Mg Tabs (Amlodipine besylate) .[name]... 1 tab daily  BP today: 141/91 Prior BP: 136/82 (07/20/2010)  Problem # 2:  HYPERLIPIDEMIA (ICD-272.4) Assessment: Unchanged LDL 152 in 08/2009. He is no statin, no diffuse muscle pain, Will continue statin and recheck FLP at next visit.  His updated medication list for this problem includes:    Simvastatin 40 Mg Tabs (Simvastatin) .[name]... Take 1 tablet by mouth once a day  Problem # 3:  ADENOCARCINOMA, PROSTATE, HX OF (ICD-V10.46) Assessment: Comment Only This has been f/u by his urologist and PSA seems undectable per pt.   Problem # 4:  BACK PAIN, CHRONIC, INTERMITTENT (ICD-724.5) Assessment: Improved He has back for 5 yrs and it happens when he works heavily. PSA has been undectable. So this islikely due to muscle strain. Since this has been musch better after overnight rest, will not give pain meds, advise rest and heating/icy pad.  His updated medication list for this problem includes:    Aspir-low 81 Mg Tbec (Aspirin) .[name]... Take 2 tablets by mouth once a day  Problem # 5:  PE (ICD-415.19) Assessment: Comment Only On coumadin and recent INR 2, in therapeutic range. F/u by Dr. Alexandria Lodge.  His updated medication list for this problem includes:    Coumadin 5 Mg Tabs (Warfarin sodium) .[name]... As instructed per pharmd    Aspir-low 81 Mg Tbec (Aspirin) .[name]... Take 2 tablets by mouth once a day  Complete  Medication List: 1)  Lisinopril 40 Mg Tabs (Lisinopril) .... Take 1 tab twice daily 2)  Norvasc 10 Mg Tabs (Amlodipine besylate) .[name].. 1 tab daily 3)  Alprazolam 0.5 Mg Tabs (Alprazolam) .... Take 1 tab twice daily as needed 4)  Coumadin 5 Mg Tabs (Warfarin sodium) .... As instructed per pharmd 5)  Aspir-low 81 Mg Tbec (Aspirin) .... Take 2 tablets by mouth once a day 6)  Simvastatin 40 Mg Tabs (Simvastatin) .... Take 1 tablet by mouth once a day  Other Orders: T-Hemoccult Card-Multiple (take home) (45409)  Patient Instructions: 1)  Please schedule a follow-up appointment in 6 months. 2)  If you have any active bleeding, please call us or go to ED.  Prescriptions: COUMADIN 5 MG TABS (WARFARIN SODIUM) as instructed per pharmD  #45  Tablet x 5   Entered and Authorized by:   [name] MD   Signed by:   [name] MD on 08/06/2010   Method used:   Electronically to        [facility]  [address] [address] [id number] (retail)       [address]       [place], [place]  [address]       Ph: [phone] or [phone]       Fax: [phone]   RxID:   [id number]    Orders Added: 1)  T-Hemoccult Card-Multiple (take home) [82270] 2)  Est. Patient Level [name] [66440]   Immunization History:  Influenza Immunization History:    Influenza:  historical (07/08/2010)   Immunization History:  Influenza Immunization History:    Influenza:  Historical (07/08/2010)  Prevention & Chronic Care Immunizations   Influenza vaccine: Historical  (07/08/2010)   Influenza vaccine deferral: Deferred  (10/15/2009)    Tetanus booster: Not documented    Pneumococcal vaccine: Not documented   Pneumococcal vaccine deferral: Deferred  (10/15/2009)    H. zoster vaccine: Not documented  Colorectal Screening   Hemoccult: Not documented   Hemoccult action/deferral: Ordered  (08/06/2010)    Colonoscopy: Not documented   Colonoscopy action/deferral: Deferred  (10/15/2009)  Other  Screening   PSA: Not documented   Smoking status: quit  (08/06/2010)  Lipids   Total Cholesterol: Not documented   LDL: Not documented   LDL Direct: Not documented   HDL: Not documented   Triglycerides: Not documented    SGOT (AST): Not documented   SGPT (ALT): Not documented   Alkaline phosphatase: Not documented   Total bilirubin: Not documented    Lipid flowsheet reviewed?: Yes   Progress toward LDL goal: Unchanged  Hypertension   Last Blood Pressure: 141 / 91  (08/06/2010)   Serum creatinine: Not documented   Serum potassium Not documented    Hypertension flowsheet reviewed?: Yes   Progress toward BP goal: Unchanged  Self-Management Support :   Personal Goals (by the next clinic visit) :      Personal blood pressure goal: 140/90  (08/06/2010)     Personal LDL goal: 130  (08/06/2010)    Patient will work on the following items until the next clinic visit to reach self-care goals:     Medications and monitoring: take my medicines every day, weigh myself weekly  (08/06/2010)     Eating: eat more vegetables  (08/06/2010)     Activity: take a 30 minute walk every day  (08/06/2010)    Hypertension self-management support: Written self-care plan, Education handout  (08/06/2010)   Hypertension self-care plan printed.   Hypertension education handout printed    Lipid self-management support: Education handout, Resources for patients handout  (07/20/2010)    Nursing Instructions: Give Pneumovax today Provide Hemoccult cards with instructions (see order)   Appended Document: EST-BACK PAIN/CFB   Immunizations Administered:  Influenza Vaccine # 1:    Vaccine Type: Fluvax MCR    Site: right deltoid    Mfr: [employer]oSmith[employer]    Dose: 0.5 ml    Route: IM    Given by: Angelina Ok RN    Exp. Date: 03/20/2011    Lot #: [id number]    VIS given: 04/14/10 version given August 11, 2010.  Flu Vaccine Consent Questions:    Do you have a history of severe allergic  reactions to this vaccine? no  Any prior history of allergic reactions to egg and/or gelatin? no    Do you have a sensitivity to the preservative Thimersol? no    Do you have a past history of Guillan-Barre Syndrome? no    Do you currently have an acute febrile illness? no    Have you ever had a severe reaction to latex? no    Vaccine information given and explained to patient? yes

## 2010-10-20 NOTE — Assessment & Plan Note (Signed)
Summary: NEW COU PT PER [facility]  Anticoagulant Therapy Managed by: Barbera Setters. Janie Morning  PharmD CACP [facility] Attending: Rogelia Boga MD, Lanora Manis Indication 1: Pulmonary  embolus Indication 2: Encounter for therapeutic drug monitoring  V58.83 Start date: 09/07/2009 Duration: 1 year  Patient Assessment Reviewed by: Chancy Milroy PharmD  September 22, 2009 Medication review: verified warfarin dosage & schedule,verified previous prescription medications, verified doses & any changes, verified new medications, reviewed OTC medications, reviewed OTC health products-vitamins supplements etc Complications: none Dietary changes: none   Health status changes: none   Lifestyle changes: none   Recent/future hospitalizations: none   Recent/future procedures: none   Recent/future dental: none Patient Assessment Part 2:  Have you MISSED ANY DOSES or CHANGED TABLETS?  No missed Warfarin doses or changed tablets.  Have you had any BRUISING or BLEEDING ( nose or gum bleeds,blood in urine or stool)?  No reported bruising or bleeding in nose, gums, urine, stool.  Have you STARTED or STOPPED any MEDICATIONS, including OTC meds,herbals or supplements?  No other medications or herbal supplements were started or stopped.  Have you CHANGED your DIET, especially green vegetables,or ALCOHOL intake?  No changes in diet or alcohol intake.  Have you had any ILLNESSES or HOSPITALIZATIONS?  No reported illnesses or hospitalizations  Have you had any signs of CLOTTING?(chest discomfort,dizziness,shortness of breath,arms tingling,slurred speech,swelling or redness in leg)    No chest discomfort, dizziness, shortness of breath, tingling in arm, slurred speech, swelling, or redness in leg.     Treatment  Target INR: 2.0-3.0 INR: 3.3  Date: 09/22/2009 INR reflects regimen in: 3.3  New  Tablet strength: : 5mg  Regimen Out:     Sunday: 1 Tablet     Monday: 1 & 1/2 Tablet     Tuesday: 1 Tablet     Wednesday: 1  Tablet     Thursday: 1 & 1/2 Tablet      Friday: 1 Tablet     Saturday: 1 Tablet Total Weekly: 40.0mg /week mg  Next INR Due: 10/06/2009 Adjusted by: Barbera Setters. Alexandria Lodge [name] PharmD CACP   Return to anticoagulation clinic:  10/06/2009 Time of next visit: 1530

## 2010-10-20 NOTE — Initial Assessments (Signed)
Summary: Admission H&P  INTERNAL MEDICINE TEACHING SERVICE ADMISSION HISTORY & PHYSICAL   Attending: Dr. Darlina Sicilian First contact: Dr. Tobie Lords [phone] Second contact: Dr. Sherryll Burger [phone] [facility], after-hours: [phone], [phone]     PCP: Dr. Threasa Beards   CC: L sided weakness and loss of speech   HPI: Patient is a 77 yo man with h/o PE on coumadin since December last year, HTN, pituitary microadenoma. He was at his cardiologist's ([facility], [place], [place]) office for a stress test today when he had a sudden onset left sided weakness and loss of speech. There was no loss of consc, headache, fever, trauma, chest pain, palpitations, SOB, fever, rash. Patient was brought to the ER  fairly quickly and a code stroke called. He was deemed unsuitable for thrombolysis owing to his INR being high.   Allergies:  NKDA  Past Medical History:   Pulmonary embolism Dec 2010 on coumadin  Hypertension.   Gastroesophageal reflux disease.   Pituitary microadenoma.   Glucose intolerance versus diabetes.   Prostatectomy for prostate cancer.   Current Meds:  LISINOPRIL 40 MG TABS (LISINOPRIL) take 1 tab twice daily NORVASC 10 MG TABS (AMLODIPINE BESYLATE) 1 tab daily PRILOSEC 20 MG CPDR (OMEPRAZOLE) Take 1 tab daily ALPRAZOLAM 0.5 MG TABS (ALPRAZOLAM) take 1 tab twice daily as needed COUMADIN 5 MG TABS (WARFARIN SODIUM) as instructed per pharmD ASPIR-LOW 81 MG TBEC (ASPIRIN) Take 2 tablets by mouth once a day   Social History: Retired. The patient is married.  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 Former Smoker and quit. Alcohol use-no   Drug use-no   Family History:   He has had 5 siblings who have died with cancer,  unknown type.  No family history of coronary artery disease.  Review of System: Negative except per HPI.    Vital Signs: BP-140/84   HR-72     RR-20     Temp-97.2   Sat-96%/ra  Physical Exam: GEN- Drowsy, responds to commands inconsistently HEENT- NCAT, PERRL, pin-point  ~60mm  bilaterally, dysconjugate gaze, no icterus/pallor, no carotid bruit LUNGS- clear to auscultation.   CVS- RRR, no murmur, bruits ABD- soft, non-tender, normal bowel sounds.   EXT- no cyanosis, clubbing or edema.  NEURO- Drowsy, moving all fours but left less than right, facial deviation mild to the right, mild left sided drift.  PSY- appropriate.    LAB RESULTS          13.2 3 >-----------<157          ANC:  1.3               MCV:90.8   142       105         9 ----------------------------------< 85 4.1         32         1.1   Calcium:   1.2    Anion gap: 5        PT/INR: 27/2.5     CT: motion degraded, L pituitary mass, remote thalamic infarct, nothing acute   ASSESSMENT & PLAN   This is a 77 yo AA man with h/o PE on adequate anticoagulation who presents with an acute stroke. Prelim evaluation rules out intracranial hemorrhage. Given his altered sensorium, will have him admitted to Neuro ICU. Will follow MRI/MRA to better define the pathological process. In terms of treatment, will continue with coumadin and aspirin. I wonder if he should be on higher  range of anticoagulation for his severe hypercoagulability. Will need to rule out other primary disorder as well. Will check for vasculitis, syphilis, HIV, 2d echo (to look for non thrombogenic source of emboli).   ATTENDING PHYSICIAN: I performed and/or observed a history and physical examination of the patient.  I discussed the case with the residents as noted and reviewed the residents' notes.  I agree with the findings and plan--please refer to the attending physician note for more details.  Signature________________________________  Printed Name_____________________________

## 2010-10-20 NOTE — Miscellaneous (Signed)
Summary: [facility]: Orders  [facility]: Orders   Imported By: Florinda Marker 09/30/2009 13:58:33  _____________________________________________________________________  External Attachment:    Type:   Image     Comment:   External Document

## 2010-10-20 NOTE — Assessment & Plan Note (Addendum)
Summary: cou/ch  Anticoagulant Therapy Managed by: Barbera Setters. Janie Morning  PharmD CACP PCP: [name] MD [facility] Attending: Onalee Hua MD, Manrique Indication 1: Pulmonary  embolus Indication 2: Encounter for therapeutic drug monitoring  V58.83 Start date: 09/07/2009 Duration: 6 months  Patient Assessment Reviewed by: Chancy Milroy PharmD  July 20, 2010 Medication review: verified warfarin dosage & schedule,verified previous prescription medications, verified doses & any changes, verified new medications, reviewed OTC medications, reviewed OTC health products-vitamins supplements etc Complications: none Dietary changes: none   Health status changes: none   Lifestyle changes: none   Recent/future hospitalizations: none   Recent/future procedures: none   Recent/future dental: none Patient Assessment Part 2:  Have you MISSED ANY DOSES or CHANGED TABLETS?  No missed Warfarin doses or changed tablets.  Have you had any BRUISING or BLEEDING ( nose or gum bleeds,blood in urine or stool)?  No reported bruising or bleeding in nose, gums, urine, stool.  Have you STARTED or STOPPED any MEDICATIONS, including OTC meds,herbals or supplements?  No other medications or herbal supplements were started or stopped.  Have you CHANGED your DIET, especially green vegetables,or ALCOHOL intake?  No changes in diet or alcohol intake.  Have you had any ILLNESSES or HOSPITALIZATIONS?  No reported illnesses or hospitalizations  Have you had any signs of CLOTTING?(chest discomfort,dizziness,shortness of breath,arms tingling,slurred speech,swelling or redness in leg)    No chest discomfort, dizziness, shortness of breath, tingling in arm, slurred speech, swelling, or redness in leg.     Treatment  Target INR: 2.0-3.0 INR: 4.1  Date: 07/20/2010 Regimen In:  42.5mg /week INR reflects regimen in: 4.1  New  Tablet strength: : 5mg  Regimen Out:     Sunday: 1 Tablet     Monday: 1/2 Tablet     Tuesday: 1  Tablet     Wednesday: 1 Tablet     Thursday: 1 Tablet      Friday: 1 Tablet     Saturday: 1 Tablet Total Weekly: 32.5mg /week mg  Next INR Due: 07/27/2010 Adjusted by: Barbera Setters. Alexandria Lodge [name] PharmD CACP   Return to anticoagulation clinic:  07/27/2010 Time of next visit: 0930   Comments: Have reviewed notes of EMR. Patient has been on Lovenox up through last week (24-Oct-11) + warfarin and has subsequently completed his Lovenox with a subtherapeutic INR documented at last visit. INR today = 4.1 Will DECREASE by approximately 10% and RTC in 1 week.  Allergies: No Known Drug Allergies  Appended Document: cou/ch Pt appears to have had a second unprovoked PE recently (Oct 2011).  If so, pt would need indefinite anticoagulation.  This occured after completing 6 mo of warfarin for 1st unprovoked PE.  Discussed with Dr. Alexandria Lodge who will update the template above.

## 2010-10-20 NOTE — Assessment & Plan Note (Signed)
Summary: dr ghimire pt was on floor [id number] hfu/ch   Vital Signs:  Patient profile:   77 year old male Height:      68.5 inches Weight:      177.4 pounds BMI:     26.68 Temp:     97.6 degrees F oral Pulse rate:   67 / minute BP sitting:   136 / 82  (right arm)  Vitals Entered By: Filomena Jungling NT II (July 20, 2010 2:42 PM) CC: HFU Is Patient Diabetic? No Pain Assessment Patient in pain? no      Nutritional Status BMI of 25 - 29 = overweight  Does patient need assistance? Functional Status Self care Ambulation Normal   Primary Care Provider:  [name] MD  CC:  HFU.  History of Present Illness: 77 y/o m with h/o of 1 unprovoked pulmonary emboli for which he stopped coumadin after 6 months of treatment followed by a second pulmonary emboli in 10/11. he was discharged on 07/13/10 on lovenox and coumadin.   he finsihed lovenox on Friday. His INR was 2.3 at that time. He has been taking coumadin 7.5 mg since then.  he denies worsening swelling in legs, breathing difficulty, chest pain or other complants.    Preventive Screening-Counseling & Management  Alcohol-Tobacco     Smoking Status: quit  Current Medications (verified): 1)  Lisinopril 40 Mg Tabs (Lisinopril) .... Take 1 Tab Twice Daily 2)  Norvasc 10 Mg Tabs (Amlodipine Besylate) .[name].. 1 Tab Daily 3)  Alprazolam 0.5 Mg Tabs (Alprazolam) .... Take 1 Tab Twice Daily As Needed 4)  Coumadin 5 Mg Tabs (Warfarin Sodium) .... As Instructed Per Pharmd 5)  Aspir-Low 81 Mg Tbec (Aspirin) .... Take 2 Tablets By Mouth Once A Day 6)  Simvastatin 40 Mg Tabs (Simvastatin) .... Take 1 Tablet By Mouth Once A Day  Allergies (verified): No Known Drug Allergies  Review of Systems  The patient denies anorexia, fever, weight loss, weight gain, vision loss, decreased hearing, hoarseness, chest pain, syncope, dyspnea on exertion, peripheral edema, prolonged cough, headaches, hemoptysis, abdominal pain, melena, hematochezia, severe  indigestion/heartburn, hematuria, incontinence, genital sores, muscle weakness, suspicious skin lesions, transient blindness, difficulty walking, depression, unusual weight change, abnormal bleeding, enlarged lymph nodes, angioedema, breast masses, and testicular masses.    Physical Exam  General:  Gen: VS reveiwed, Alert, well developed, nodistress ENT: mucous membranes pink & moist. No abnormal finds in ear and nose. CVC:S1 S2 , no murmurs, no abnormal heart sounds. Lungs: Clear to auscultation B/L. No wheezes, crackles or other abnormal sounds Abdomen: soft, non distended, no tender. Normal Bowel sounds EXT: no pitting edema, no engorged veins, Pulsations normal  Neuro:alert, oriented *3, cranial nerved 2-12 intact, strenght normal in all  extremities, senstations normal to light touch.      Impression & Recommendations:  Problem # 1:  ESSENTIAL HYPERTENSION, BENIGN (ICD-401.1) well controlled.  no change in meds  His updated medication list for this problem includes:    Lisinopril 40 Mg Tabs (Lisinopril) .[name]... Take 1 tab twice daily    Norvasc 10 Mg Tabs (Amlodipine besylate) .[name]... 1 tab daily  BP today: 136/82 Prior BP: 128/86 (02/11/2010)  Problem # 2:  HYPERLIPIDEMIA (ICD-272.4) no FLP on file but he has been on simvastatin since his admission for stroke like symtptoms. will continue this med and check FLP at next visit. he had lunch before he came her today  His updated medication list for this problem includes:    Simvastatin 40  Mg Tabs (Simvastatin) .[name]... Take 1 tablet by mouth once a day  Problem # 3:  PE (ICD-415.19) no s/s of PE/DVT.  breathing and vitals stable hypeorcoag workup negative in the hospital except functional prot C def which can be explained by the fact that the test was done in setting of acute PE and a repeat test in 6 months will be required to confirm it. but patient will be on coumadin anyway so there is no benfit of retesting it.   plan -  conitnue coumadin-lifelong - is seeing Dr. Alexandria Lodge today for INR check and dose adjustment if needed  His updated medication list for this problem includes:    Coumadin 5 Mg Tabs (Warfarin sodium) .[name]... As instructed per pharmd    Aspir-low 81 Mg Tbec (Aspirin) .[name]... Take 2 tablets by mouth once a day  Complete Medication List: 1)  Lisinopril 40 Mg Tabs (Lisinopril) .... Take 1 tab twice daily 2)  Norvasc 10 Mg Tabs (Amlodipine besylate) .[name].. 1 tab daily 3)  Alprazolam 0.5 Mg Tabs (Alprazolam) .... Take 1 tab twice daily as needed 4)  Coumadin 5 Mg Tabs (Warfarin sodium) .... As instructed per pharmd 5)  Aspir-low 81 Mg Tbec (Aspirin) .... Take 2 tablets by mouth once a day 6)  Simvastatin 40 Mg Tabs (Simvastatin) .... Take 1 tablet by mouth once a day  Patient Instructions: 1)  Please schedule a follow-up appointment in 2 months. Come fasting at that time   Orders Added: 1)  Est. Patient Level IV [01601]    Prevention & Chronic Care Immunizations   Influenza vaccine: Not documented   Influenza vaccine deferral: Deferred  (10/15/2009)    Tetanus booster: Not documented    Pneumococcal vaccine: Not documented   Pneumococcal vaccine deferral: Deferred  (10/15/2009)    H. zoster vaccine: Not documented  Colorectal Screening   Hemoccult: Not documented    Colonoscopy: Not documented   Colonoscopy action/deferral: Deferred  (10/15/2009)  Other Screening   PSA: Not documented   Smoking status: quit  (07/20/2010)  Lipids   Total Cholesterol: Not documented   LDL: Not documented   LDL Direct: Not documented   HDL: Not documented   Triglycerides: Not documented    SGOT (AST): Not documented   SGPT (ALT): Not documented   Alkaline phosphatase: Not documented   Total bilirubin: Not documented  Hypertension   Last Blood Pressure: 136 / 82  (07/20/2010)   Serum creatinine: Not documented   Serum potassium Not documented  Self-Management Support :    Patient will  work on the following items until the next clinic visit to reach self-care goals:     Medications and monitoring: take my medicines every day  (07/20/2010)     Eating: drink diet soda or water instead of juice or soda, eat more vegetables, use fresh or frozen vegetables, eat foods that are low in salt, eat baked foods instead of fried foods  (07/20/2010)    Hypertension self-management support: Education handout, Resources for patients handout  (07/20/2010)   Hypertension education handout printed    Lipid self-management support: Education handout, Resources for patients handout  (07/20/2010)     Lipid education handout printed      Resource handout printed.

## 2010-10-20 NOTE — Assessment & Plan Note (Signed)
Summary: 261/cfb  Anticoagulant Therapy Managed by: Barbera Setters. Janie Morning  PharmD CACP PCP: [name] MD [facility] Attending: Margarito Liner MD Indication 1: Pulmonary  embolus Indication 2: Encounter for therapeutic drug monitoring  V58.83 Start date: 09/07/2009 Duration: 1 year  Patient Assessment Reviewed by: Chancy Milroy PharmD  October 27, 2009 Medication review: verified warfarin dosage & schedule,verified previous prescription medications, verified doses & any changes, verified new medications, reviewed OTC medications, reviewed OTC health products-vitamins supplements etc Complications: none Dietary changes: none   Health status changes: none   Lifestyle changes: none   Recent/future hospitalizations: none   Recent/future procedures: none   Recent/future dental: none Patient Assessment Part 2:  Have you MISSED ANY DOSES or CHANGED TABLETS?  No missed Warfarin doses or changed tablets.  Have you had any BRUISING or BLEEDING ( nose or gum bleeds,blood in urine or stool)?  No reported bruising or bleeding in nose, gums, urine, stool.  Have you STARTED or STOPPED any MEDICATIONS, including OTC meds,herbals or supplements?  No other medications or herbal supplements were started or stopped.  Have you CHANGED your DIET, especially green vegetables,or ALCOHOL intake?  No changes in diet or alcohol intake.  Have you had any ILLNESSES or HOSPITALIZATIONS?  No reported illnesses or hospitalizations  Have you had any signs of CLOTTING?(chest discomfort,dizziness,shortness of breath,arms tingling,slurred speech,swelling or redness in leg)    No chest discomfort, dizziness, shortness of breath, tingling in arm, slurred speech, swelling, or redness in leg.     Treatment  Target INR: 2.0-3.0 INR: 2.6  Date: 10/27/2009 Regimen In:  35.0mg /week INR reflects regimen in: 2.6  New  Tablet strength: : 5mg  Regimen Out:     Sunday: 1 Tablet     Monday: 1 & 1/2 Tablet     Tuesday: 1  Tablet     Wednesday: 1 Tablet     Thursday: 1 & 1/2 Tablet      Friday: 1 Tablet     Saturday: 1 Tablet Total Weekly: 40.0mg /week mg  Next INR Due: 11/17/2009 Adjusted by: Barbera Setters. Alexandria Lodge [name] PharmD CACP   Return to anticoagulation clinic:  11/17/2009 Time of next visit: 1515    Allergies: No Known Drug Allergies

## 2010-10-20 NOTE — Assessment & Plan Note (Signed)
Summary: COU/VS  Anticoagulant Therapy Managed by: Barbera Setters. [name] [name]  PharmD CACP PCP: [name] MD [facility] Attending: Darl Pikes, Beth Indication 1: Pulmonary  embolus Indication 2: Encounter for therapeutic drug monitoring  V58.83 Start date: 09/07/2009 Duration: 1 year  Patient Assessment Reviewed by: Chancy Milroy PharmD  November 17, 2009 Medication review: verified warfarin dosage & schedule,verified previous prescription medications, verified doses & any changes, verified new medications, reviewed OTC medications, reviewed OTC health products-vitamins supplements etc Complications: none Dietary changes: none   Health status changes: none   Lifestyle changes: none   Recent/future hospitalizations: none   Recent/future procedures: none   Recent/future dental: none Patient Assessment Part 2:  Have you MISSED ANY DOSES or CHANGED TABLETS?  No missed Warfarin doses or changed tablets.  Have you had any BRUISING or BLEEDING ( nose or gum bleeds,blood in urine or stool)?  No reported bruising or bleeding in nose, gums, urine, stool.  Have you STARTED or STOPPED any MEDICATIONS, including OTC meds,herbals or supplements?  No other medications or herbal supplements were started or stopped.  Have you CHANGED your DIET, especially green vegetables,or ALCOHOL intake?  No changes in diet or alcohol intake.  Have you had any ILLNESSES or HOSPITALIZATIONS?  No reported illnesses or hospitalizations  Have you had any signs of CLOTTING?(chest discomfort,dizziness,shortness of breath,arms tingling,slurred speech,swelling or redness in leg)    No chest discomfort, dizziness, shortness of breath, tingling in arm, slurred speech, swelling, or redness in leg.     Treatment  Target INR: 2.0-3.0 INR: 2.6  Date: 11/17/2009 Regimen In:  40.0mg /week INR reflects regimen in: 2.6  New  Tablet strength: : 5mg  Regimen Out:     Sunday: 1 Tablet     Monday: 1 & 1/2 Tablet     Tuesday: 1  Tablet     Wednesday: 1 Tablet     Thursday: 1 & 1/2 Tablet      Friday: 1 Tablet     Saturday: 1 Tablet Total Weekly: 40.0mg /week mg  Next INR Due: 12/15/2009 Adjusted by: Barbera Setters. Alexandria Lodge [name] PharmD CACP   Return to anticoagulation clinic:  12/15/2009 Time of next visit: 1530    Allergies: No Known Drug Allergies

## 2010-10-20 NOTE — Progress Notes (Signed)
  Phone Note From Other Clinic   Caller: [name] [name] Call For: [name] PharmD Reason for Call: Need Referral Information, Schedule Patient Appt, Medication Check Request: Talk with Tracen Mahler, Call Patient Action Taken: Phone Call Completed, Yecenia Dalgleish Notified, Patient called Details for Reason: Patient seen in [facility] yesterday by Dr. [name]. Patient had INR = 1.6 on 32.5mg /wk (was supposed to be taking 37.5mg /wk).  Details of Complaint: No symptoms or signs of bleeding. Details of Action Taken: Dose was increased TO 37.5mg /wk and patient was provided written instructions reflecting this dose regimen. RTC in 1 week, 12-Dec-11. Summary of Call: Patient seen by Dr. [name] in my absence. Initial call taken by: [name] PharmD

## 2010-10-20 NOTE — Progress Notes (Signed)
Summary: coumadin/ hla  Phone Note Call from Patient   Summary of Call: pt's caregiver calls and states she is concerned about pt's pt/ inr and coumadin therapy, has appt for 5/9 but doesn't think he can probably wait that long. paged Chancy Milroy, he will see pt 5/4 at 0900, called caregiver, andrea,  back and she will bring pt 5/4 0900 Initial call taken by: Marin Roberts RN,  Jan 20, 2010 5:05 PM

## 2010-10-20 NOTE — Assessment & Plan Note (Signed)
Summary: HFU PER RIOFRIO/CH   Vital Signs:  Patient profile:   77 year old male Height:      68.5 inches (173.99 cm) Weight:      178.7 pounds (81.23 kg) BMI:     26.87 Temp:     98.6 degrees F (37.00 degrees C) oral Pulse rate:   81 / minute BP sitting:   138 / 91  (left arm) Cuff size:   regular  Vitals Entered By: Krystal Eaton Duncan Dull) (October 15, 2009 3:07 PM) Is Patient Diabetic? No Pain Assessment Patient in pain? yes     Location: rib pain Intensity: 3 Type: sharp Onset of pain  Chronic s/p injury in 2005 Nutritional Status BMI of > 30 = obese  Have you ever been in a relationship where you felt threatened, hurt or afraid?No   Does patient need assistance? Functional Status Self care Ambulation Normal   Primary Care Provider:  [name] MD   History of Present Illness: Pt is a 77 yo AAM with PMH of HTN, PE and HLD who came to [facility] for regular f/u. He has PE in 08/2009 and has been on coumadin and f/u by Dr. Alexandria Lodge. Recent INR 3.8 and has decreased his coumadin to 5 mg once daily. He has no active bleeding, No CP, SOB, leg swelling or pain. Good appetite and no melena or dysuria.   Preventive Screening-Counseling & Management  Alcohol-Tobacco     Smoking Status: quit      Drug Use:  no.    Problems Prior to Update: 1)  Long-term Use of Antiplatelet/antithrombotic  (ICD-V58.63) 2)  Pe  (ICD-415.19)  Current Problems (verified): 1)  Gerd  (ICD-530.81) 2)  Hyperlipidemia  (ICD-272.4) 3)  Essential Hypertension, Benign  (ICD-401.1) 4)  Long-term Use of Antiplatelet/antithrombotic  (ICD-V58.63) 5)  Pe  (ICD-415.19)  Medications Prior to Update: 1)  Lisinopril 40 Mg Tabs (Lisinopril) .... Take 1 Tab Twice Daily 2)  Norvasc 10 Mg Tabs (Amlodipine Besylate) .[name].. 1 Tab Daily 3)  Prilosec 20 Mg Cpdr (Omeprazole) .... Take 1 Tab Daily 4)  Alprazolam 0.5 Mg Tabs (Alprazolam) .... Take 1 Tab Twice Daily As Needed 5)  Lovenox 80 Mg/0.35ml Soln (Enoxaparin  Sodium) .... Inject 80 Units Twice Daily Until Inr 2-3 For 24 Hrs 6)  Coumadin 5 Mg Tabs (Warfarin Sodium) .... Take 1.5 Tabs Daily Until Inr 2-3, Then As Adjusted By Physician or Pharmd  Current Medications (verified): 1)  Lisinopril 40 Mg Tabs (Lisinopril) .... Take 1 Tab Twice Daily 2)  Norvasc 10 Mg Tabs (Amlodipine Besylate) .[name].. 1 Tab Daily 3)  Prilosec 20 Mg Cpdr (Omeprazole) .... Take 1 Tab Daily 4)  Alprazolam 0.5 Mg Tabs (Alprazolam) .... Take 1 Tab Twice Daily As Needed 5)  Coumadin 5 Mg Tabs (Warfarin Sodium) .... As Instructed Per Pharmd 6)  Aspir-Low 81 Mg Tbec (Aspirin) .... Take 2 Tablets By Mouth Once A Day  Allergies (verified): No Known Drug Allergies  Past History:  Social History: Last updated: 10/15/2009 Retired Married Former Smoker and quitted. Alcohol use-no Drug use-no  Risk Factors: Smoking Status: quit (10/15/2009)  Social History: Reviewed history and no changes required. Retired Married Former Games developer and quitted. Alcohol use-no Drug use-no Smoking Status:  quit Drug Use:  no  Review of Systems  The patient denies fever, weight gain, chest pain, syncope, dyspnea on exertion, peripheral edema, prolonged cough, headaches, hemoptysis, abdominal pain, and melena.    Physical Exam  General:  alert, well-developed, well-nourished, and well-hydrated.  Head:  normocephalic.   Ears:  no external deformities.   Nose:  no external erythema and no nasal discharge.   Mouth:  pharynx pink and moist.   Neck:  supple.   Chest Wall:  no deformities.   Lungs:  normal respiratory effort, no accessory muscle use, normal breath sounds, no crackles, and no wheezes.   Heart:  normal rate, regular rhythm, no murmur, no gallop, and no JVD.   Abdomen:  soft, non-tender, normal bowel sounds, no distention, and no masses.   Msk:  normal ROM, no joint tenderness, no joint swelling, no joint warmth, and no redness over joints.   Pulses:  2+ Extremities:  No  edema. Neurologic:  alert & oriented X3, cranial nerves II-XII intact, strength normal in all extremities, sensation intact to light touch, and gait normal.     Impression & Recommendations:  Problem # 1:  PE (ICD-415.19) Assessment Unchanged He has newly diagosed PE and has been on coumadin, the therapeutic range of INR is 2-3. Recent INR 3.8 and Dr. Alexandria Lodge has adjusted the dose. Will continue ASA. The following medications were removed from the medication list:    Lovenox 80 Mg/0.21ml Soln (Enoxaparin sodium) ..... Inject 80 units twice daily until inr 2-3 for 24 hrs His updated medication list for this problem includes:    Coumadin 5 Mg Tabs (Warfarin sodium) .[name]... As instructed per pharmd    Aspir-low 81 Mg Tbec (Aspirin) .[name]... Take 2 tablets by mouth once a day  Problem # 2:  ESSENTIAL HYPERTENSION, BENIGN (ICD-401.1) Assessment: Improved Well controlled and will continue current meds.  His updated medication list for this problem includes:    Lisinopril 40 Mg Tabs (Lisinopril) .[name]... Take 1 tab twice daily    Norvasc 10 Mg Tabs (Amlodipine besylate) .[name]... 1 tab daily  BP today: 138/91  Problem # 3:  HYPERLIPIDEMIA (ICD-272.4) Assessment: Unchanged His recent LDL 151. Has advised weight loss, exercise and healthy diet. If no improvement at next visit, may start statin.  Complete Medication List: 1)  Lisinopril 40 Mg Tabs (Lisinopril) .... Take 1 tab twice daily 2)  Norvasc 10 Mg Tabs (Amlodipine besylate) .[name].. 1 tab daily 3)  Prilosec 20 Mg Cpdr (Omeprazole) .... Take 1 tab daily 4)  Alprazolam 0.5 Mg Tabs (Alprazolam) .... Take 1 tab twice daily as needed 5)  Coumadin 5 Mg Tabs (Warfarin sodium) .... As instructed per pharmd 6)  Aspir-low 81 Mg Tbec (Aspirin) .... Take 2 tablets by mouth once a day  Patient Instructions: 1)  Please schedule a follow-up appointment in 6 months.   Prevention & Chronic Care Immunizations   Influenza vaccine: Not documented   Influenza  vaccine deferral: Deferred  (10/15/2009)    Tetanus booster: Not documented    Pneumococcal vaccine: Not documented   Pneumococcal vaccine deferral: Deferred  (10/15/2009)    H. zoster vaccine: Not documented  Colorectal Screening   Hemoccult: Not documented    Colonoscopy: Not documented   Colonoscopy action/deferral: Deferred  (10/15/2009)  Other Screening   PSA: Not documented   Smoking status: quit  (10/15/2009)  Lipids   Total Cholesterol: Not documented   LDL: Not documented   LDL Direct: Not documented   HDL: Not documented   Triglycerides: Not documented    SGOT (AST): Not documented   SGPT (ALT): Not documented   Alkaline phosphatase: Not documented   Total bilirubin: Not documented  Hypertension   Last Blood Pressure: 138 / 91  (10/15/2009)   Serum  creatinine: Not documented   Serum potassium Not documented  Self-Management Support :    Hypertension self-management support: Not documented    Lipid self-management support: Not documented

## 2010-10-20 NOTE — Assessment & Plan Note (Signed)
Summary: opccou/cfb  Anticoagulant Therapy Managed by: Barbera Setters. [name] [name]  PharmD CACP PCP: [name] [name] [facility] Attending: [name] [name], [name] Indication 1: Pulmonary  embolus Indication 2: Encounter for therapeutic drug monitoring  V58.83 Start date: 09/07/2009 Duration: 1 year  Patient Assessment Reviewed by: Chancy Milroy PharmD  Jan 26, 2010 Medication review: verified warfarin dosage & schedule,verified previous prescription medications, verified doses & any changes, verified new medications, reviewed OTC medications, reviewed OTC health products-vitamins supplements etc Complications: none Dietary changes: none   Health status changes: none   Lifestyle changes: none   Recent/future hospitalizations: none   Recent/future procedures: none   Recent/future dental: none Patient Assessment Part 2:  Have you MISSED ANY DOSES or CHANGED TABLETS?  No missed Warfarin doses or changed tablets.  Have you had any BRUISING or BLEEDING ( nose or gum bleeds,blood in urine or stool)?  No reported bruising or bleeding in nose, gums, urine, stool.  Have you STARTED or STOPPED any MEDICATIONS, including OTC meds,herbals or supplements?  No other medications or herbal supplements were started or stopped.  Have you CHANGED your DIET, especially green vegetables,or ALCOHOL intake?  No changes in diet or alcohol intake.  Have you had any ILLNESSES or HOSPITALIZATIONS?  No reported illnesses or hospitalizations  Have you had any signs of CLOTTING?(chest discomfort,dizziness,shortness of breath,arms tingling,slurred speech,swelling or redness in leg)    No chest discomfort, dizziness, shortness of breath, tingling in arm, slurred speech, swelling, or redness in leg.     Treatment  Target INR: 2.0-3.0 INR: 2.1  Date: 01/26/2010 Regimen In:  45mg /wk INR reflects regimen in: 2.1  New  Tablet strength: : 5mg  Regimen Out:     Sunday: 1 & 1/2 Tablet     Monday: 1 Tablet     Tuesday: 1 &  1/2 Tablet     Wednesday: 1 & 1/2 Tablet     Thursday: 1 Tablet      Friday: 1 & 1/2 Tablet     Saturday: 1 & 1/2 Tablet Total Weekly: 47.5mg /week mg  Next INR Due: 02/23/2010 Adjusted by: Barbera Setters. 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

## 2010-10-20 NOTE — Assessment & Plan Note (Signed)
Summary: per Vonna Kotyk, see note/pcp-yang/hla  Anticoagulant Therapy Managed by: Barbera Setters. Janie Morning  PharmD CACP PCP: [name] MD [facility] Attending: Julaine Fusi DO Indication 1: Pulmonary  embolus Indication 2: Encounter for therapeutic drug monitoring  V58.83 Start date: 09/07/2009 Duration: 1 year  Patient Assessment Reviewed by: Chancy Milroy PharmD  Jan 21, 2010 Medication review: verified warfarin dosage & schedule,verified previous prescription medications, verified doses & any changes, verified new medications, reviewed OTC medications, reviewed OTC health products-vitamins supplements etc Complications: none Dietary changes: none   Health status changes: none   Lifestyle changes: none   Recent/future hospitalizations: none   Recent/future procedures: none   Recent/future dental: none Patient Assessment Part 2:  Have you MISSED ANY DOSES or CHANGED TABLETS?  YES. States he missed a dose on Sunday 1-May-11.  Have you had any BRUISING or BLEEDING ( nose or gum bleeds,blood in urine or stool)?  No reported bruising or bleeding in nose, gums, urine, stool.  Have you STARTED or STOPPED any MEDICATIONS, including OTC meds,herbals or supplements?  No other medications or herbal supplements were started or stopped.  Have you CHANGED your DIET, especially green vegetables,or ALCOHOL intake?  No changes in diet or alcohol intake.  Have you had any ILLNESSES or HOSPITALIZATIONS?  YES. Hospitalized due to suspicion of TIA. See hospital discharge notes.  Have you had any signs of CLOTTING?(chest discomfort,dizziness,shortness of breath,arms tingling,slurred speech,swelling or redness in leg)    No chest discomfort, dizziness, shortness of breath, tingling in arm, slurred speech, swelling, or redness in leg.     Treatment  Target INR: 2.0-3.0 INR: 1.8  Date: 01/21/2010 Regimen In:  40.0mg /week INR reflects regimen in: 1.8  New  Tablet strength: : 5mg  Regimen Out:     Sunday: 1  & 1/2 Tablet     Monday: 1 Tablet     Tuesday: 1 Tablet     Wednesday: 1 & 1/2 Tablet     Thursday: 1 & 1/2 Tablet      Friday: 1 Tablet     Saturday: 1 & 1/2 Tablet Total Weekly: 45mg /wk mg  Next INR Due: 01/26/2010 Adjusted by: Barbera Setters. Alexandria Lodge [name] PharmD CACP   Return to anticoagulation clinic:  01/26/2010 Time of next visit: 0900    Allergies: No Known Drug Allergies

## 2010-10-20 NOTE — Assessment & Plan Note (Signed)
Summary: ACUTE-LEGS HURTING-/CFB([facility])   Allergies: No Known Drug Allergies   Complete Medication List: 1)  Lisinopril 40 Mg Tabs (Lisinopril) .... Take 1 tab twice daily 2)  Norvasc 10 Mg Tabs (Amlodipine besylate) .[name].. 1 tab daily 3)  Prilosec 20 Mg Cpdr (Omeprazole) .... Take 1 tab daily 4)  Alprazolam 0.5 Mg Tabs (Alprazolam) .... Take 1 tab twice daily as needed 5)  Coumadin 5 Mg Tabs (Warfarin sodium) .... As instructed per pharmd 6)  Aspir-low 81 Mg Tbec (Aspirin) .... Take 2 tablets by mouth once a day Patient not seen. Patient saw Dr. Alexandria Lodge and left from his office.

## 2010-10-20 NOTE — Assessment & Plan Note (Signed)
Summary: COU AND SPEAK W/MD/CH  Nurse Visit   Primary Care Provider:  [name] MD   History of Present Illness: 77 year old who comes in to get his PT/INR checked on coumadin. 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   Speaking with him, he has taken the incorrect dosage of coumadin for the last month: (5mg  tabs)-1 every day, except 1/2 on Monday. However the prescribed dose was 1every day, except for 1 1/2 on Monday. Therefore he has been taking 32.5mg  a week instead of 37.5mg  a week that was prescribed.  He has not had any change in his diet. He has not had anything worrisome for DVT or PE.  Today his INR is 1.6   Past History:  Past Medical History: Reviewed history from 07/15/2010 and no changes required. 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 He is to follow up with Dr. Alexandria Lodge on 08/31/10 at 9 AM. I spent some time with him making sure he understood the change in dose. He did understand that he had not taken the correct dose over the last month.  Complete Medication List: 1)  Lisinopril 40 Mg Tabs (Lisinopril) .... Take 1 tab twice daily 2)  Norvasc 10 Mg Tabs (Amlodipine besylate) .[name].. 1 tab daily 3)  Alprazolam 0.5 Mg Tabs (Alprazolam) .... Take 1 tab twice daily as needed 4)  Coumadin 5 Mg Tabs (Warfarin sodium) .... As instructed per pharmd 5)  Aspir-low 81 Mg Tbec (Aspirin) .... Take 2 tablets by  mouth once a day 6)  Simvastatin 40 Mg Tabs (Simvastatin) .... Take 1 tablet by mouth once a day   Allergies: No Known Drug Allergies Laboratory Results   Blood Tests   Date/Time Received: August 24, 2010 11:52 AM. Date/Time Reported: Burke Keels  August 24, 2010 11:52 AM     INR: 1.6   (Normal Range: 0.88-1.12   Therap INR: 2.0-3.5) Comments: Reported to [facility] h RNby Alric Quan 08-24-10 at 1145a   Burke Keels  August 24, 2010 11:52 AM     Orders Added: 1)  Est. Patient Level II [11914]  Appended Document: COU AND SPEAK W/MD/CH I spoke with patient's daughter, Floyde Parkins, and informed her of the change in dose.

## 2010-10-20 NOTE — Discharge Summary (Signed)
Summary: Hospital Discharge Update 01/17/10    Hospital Discharge Update:  Date of Admission: 01/15/2010 Date of Discharge: 01/17/2010  Brief Summary:  see dr. Edgar Frisk note  Discharge medications:  LISINOPRIL 40 MG TABS (LISINOPRIL) take 1 tab twice daily NORVASC 10 MG TABS (AMLODIPINE BESYLATE) 1 tab daily PRILOSEC 20 MG CPDR (OMEPRAZOLE) Take 1 tab daily ALPRAZOLAM 0.5 MG TABS (ALPRAZOLAM) take 1 tab twice daily as needed COUMADIN 5 MG TABS (WARFARIN SODIUM) as instructed per pharmD ASPIR-LOW 81 MG TBEC (ASPIRIN) Take 2 tablets by mouth once a day  Other patient instructions:  Hold your BP medications lisinopril and norvasc for 3 days and then start them back on 01/20/10  Note: Hospital Discharge Medications & Other Instructions handout was printed, one copy for patient and a second copy to be placed in hospital chart.

## 2010-10-20 NOTE — Miscellaneous (Signed)
Summary: [facility]: Orders  [facility]: Orders   Imported By: Florinda Marker 10/14/2009 14:15:54  _____________________________________________________________________  External Attachment:    Type:   Image     Comment:   External Document

## 2010-10-20 NOTE — Progress Notes (Signed)
  Phone Note From Other Clinic   Caller: Nurse Call For: [name] PharmD CACP Reason for Call: Need Referral Information, Schedule Patient Appt, Medication Check, Diagnosis Check Request: Talk with Provider Action Taken: Phone Call Completed, Provider Notified, Patient called Details for Reason: [facility] RN called stating she had collected a home PT/INR for which result was 1.6 on 35mg /wk. Details of Complaint: No signs/symptoms of bleeding. Details of Action Taken: Increase dose to 40mg /wk (1 and 1/2 x 5mg  warfarin = 7.5mg ) on M/Th; and 5mg  warfarin by mouth all other days. Next INR = 7-Feb-11. Patient may have been released at that time from [facility]---RN was instructed to advise patient to come to [facility]  on 7-Feb-11. Summary of Call: Telemanagement of warfarin. Initial call taken by: [name] PharmD CACP

## 2010-10-20 NOTE — Miscellaneous (Signed)
Summary: hosp f/u  Hospital Discharge  Date of admission: 01/15/10  Date of discharge: 01/17/10  Brief reason for admission/active problems: Pt was admitted for sudden onset weakness and difficulty speaking. CT and MRI were negative for infarct. Differential at the time of this writing is seizure vs TIA, EEG and 2D echo pending per [name] [name].  Followup needed: [name] [name] will follow-up with [name] [name] on May 25 @ 2 pm. At this appointment, please see if he has followed up with [name] [name] in neuro clinic and make sure the patient has not have any more symptoms.   The medication and problem lists have been updated.  Please see the dictated discharge summary for details.

## 2010-10-20 NOTE — Assessment & Plan Note (Signed)
Summary: cou/ch  Anticoagulant Therapy Managed by: Barbera Setters. Janie Morning  PharmD CACP PCP: [name] MD [facility] Attending: Daiva Eves MD, Remi Haggard Indication 1: Pulmonary  embolus Indication 2: Encounter for therapeutic drug monitoring  V58.83 Start date: 09/07/2009 Duration: 1 year  Patient Assessment Reviewed by: Chancy Milroy PharmD  Feb 02, 2010 Medication review: verified warfarin dosage & schedule,verified previous prescription medications, verified doses & any changes, verified new medications, reviewed OTC medications, reviewed OTC health products-vitamins supplements etc Complications: none Dietary changes: none   Health status changes: none   Lifestyle changes: none   Recent/future hospitalizations: none   Recent/future procedures: none   Recent/future dental: none Patient Assessment Part 2:  Have you MISSED ANY DOSES or CHANGED TABLETS?  No missed Warfarin doses or changed tablets.  Have you had any BRUISING or BLEEDING ( nose or gum bleeds,blood in urine or stool)?  No reported bruising or bleeding in nose, gums, urine, stool.  Have you STARTED or STOPPED any MEDICATIONS, including OTC meds,herbals or supplements?  No other medications or herbal supplements were started or stopped.  Have you CHANGED your DIET, especially green vegetables,or ALCOHOL intake?  No changes in diet or alcohol intake.  Have you had any ILLNESSES or HOSPITALIZATIONS?  No reported illnesses or hospitalizations  Have you had any signs of CLOTTING?(chest discomfort,dizziness,shortness of breath,arms tingling,slurred speech,swelling or redness in leg)    No chest discomfort, dizziness, shortness of breath, tingling in arm, slurred speech, swelling, or redness in leg.     Treatment  Target INR: 2.0-3.0 INR: 4.3  Date: 02/02/2010 Regimen In:  47.5mg /week INR reflects regimen in: 4.3  New  Tablet strength: : 5mg  Regimen Out:     Sunday: 1 Tablet     Monday: 1 & 1/2 Tablet     Tuesday: 1  Tablet     Wednesday: 1 & 1/2 Tablet     Thursday: 1 Tablet      Friday: 1 & 1/2 Tablet     Saturday: 1 Tablet Total Weekly: 42.5mg /week mg  Next INR Due: 02/23/2010 Adjusted by: Barbera Setters. Alexandria Lodge [name] PharmD CACP   Return to anticoagulation clinic:  02/23/2010  Hold:  1 Days     Allergies: No Known Drug Allergies

## 2010-10-20 NOTE — Assessment & Plan Note (Signed)
Summary: 24 holter  Nurse Visit   Vital Signs:  Patient profile:   77 year old male Height:      68.5 inches O2 Sat:      95 % Pulse rate:   71 / minute BP sitting:   123 / 78  (left arm) Cuff size:   regular  Vitals Entered By: Carlye Grippe (January 15, 2010 9:55 AM) CC: 24 holter Comments holter monitor placed without difficulty. during patient's departure from office,wife stated that he was dizzy. Nurse took vitals and informed patient and wife that due to the sudden change in status,slurred speech,level of conciousness,disoriented, patient needed to be evaluated by ED. Nurse called 911. EMS arrived and patient left office on stretcher.  Nurse called daughter Claiborne Billings to pick her mom up from office.    Allergies: No Known Drug Allergies  Appended Document: 24 holter Dr. Clarisa Kindred office notified.

## 2010-10-20 NOTE — Progress Notes (Signed)
  Phone Note Call from Patient   Summary of Call: pt calls concerning his "blood clot" problem, coumadin. he hit his leg last night at church on furniture, has a bruise, is not extremely swollen, is not very painful, is able to amb w/o problem but does not want to have a problem w/ his past hx. he is able to stretch his leg out and bend toes toward knee w/o sudden pain. 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 he explained he is worried but his wife of 50 yrs is not well and it will take a few hrs to make arrangements for her, i told him to do so and call back when the plans at home are complete, that if at that time the clinic has had a cancellation we will see him if not he will need to be evaluated at urgent care. Initial call taken by: Marin Roberts RN,  October 31, 2009 12:18 PM  Follow-up for Phone Call        noted and agree Follow-up by: Zoila Shutter MD,  October 31, 2009 2:26 PM     Appended Document:  to urgent care per attending

## 2010-10-20 NOTE — Miscellaneous (Signed)
Summary: [facility]: [facility] Health Cert[facility] & Plan Of Care  [facility]: [facility] Cert[facility] Plan Of Care   Imported By: Florinda Marker 09/30/2009 13:57:55  _____________________________________________________________________  External Attachment:    Type:   Image     Comment:   External Document

## 2010-10-20 NOTE — Assessment & Plan Note (Signed)
Summary: COU/SB.  Anticoagulant Therapy Managed by: Barbera Setters. Janie Morning  PharmD CACP PCP: [name] MD [facility] Attending: Darl Pikes, Beth Indication 1: Pulmonary  embolus Indication 2: Encounter for therapeutic drug monitoring  V58.83 Start date: 09/07/2009 Duration: 6 months  Patient Assessment Reviewed by: Chancy Milroy PharmD  July 27, 2010 Medication review: verified warfarin dosage & schedule,verified previous prescription medications, verified doses & any changes, verified new medications, reviewed OTC medications, reviewed OTC health products-vitamins supplements etc Complications: none Dietary changes: none   Health status changes: none   Lifestyle changes: none   Recent/future hospitalizations: none   Recent/future procedures: none   Recent/future dental: none Patient Assessment Part 2:  Have you MISSED ANY DOSES or CHANGED TABLETS?  No missed Warfarin doses or changed tablets.  Have you had any BRUISING or BLEEDING ( nose or gum bleeds,blood in urine or stool)?  No reported bruising or bleeding in nose, gums, urine, stool.  Have you STARTED or STOPPED any MEDICATIONS, including OTC meds,herbals or supplements?  No other medications or herbal supplements were started or stopped.  Have you CHANGED your DIET, especially green vegetables,or ALCOHOL intake?  No changes in diet or alcohol intake.  Have you had any ILLNESSES or HOSPITALIZATIONS?  No reported illnesses or hospitalizations  Have you had any signs of CLOTTING?(chest discomfort,dizziness,shortness of breath,arms tingling,slurred speech,swelling or redness in leg)    No chest discomfort, dizziness, shortness of breath, tingling in arm, slurred speech, swelling, or redness in leg.     Treatment  Target INR: 2.0-3.0 INR: 2.0  Date: 07/27/2010 Regimen In:  32.5mg /week INR reflects regimen in: 2.0  New  Tablet strength: : 5mg  Regimen Out:     Sunday: 1 Tablet     Monday: 1 Tablet     Tuesday: 1  Tablet     Wednesday: 1 & 1/2 Tablet     Thursday: 1 Tablet      Friday: 1 Tablet     Saturday: 1 Tablet Total Weekly: 37.5mg /week mg  Next INR Due: 08/10/2010 Adjusted by: Barbera Setters. Alexandria Lodge [name] PharmD CACP   Return to anticoagulation clinic:  08/10/2010 Time of next visit: 0945    Allergies: No Known Drug Allergies

## 2010-10-22 NOTE — Assessment & Plan Note (Signed)
Summary: COU/CH  Anticoagulant Therapy Managed by: Barbera Setters. [name] [name]  PharmD CACP PCP: [name] MD [facility] Attending: Reche Dixon MD, David Indication 1: Pulmonary  embolus Indication 2: Encounter for therapeutic drug monitoring  V58.83 Start date: 09/07/2009 Duration: 6 months  Patient Assessment Reviewed by: Chancy Milroy PharmD  September 21, 2010 Medication review: verified warfarin dosage & schedule,verified previous prescription medications, verified doses & any changes, verified new medications, reviewed OTC medications, reviewed OTC health products-vitamins supplements etc Complications: none Dietary changes: none   Health status changes: none   Lifestyle changes: none   Recent/future hospitalizations: none   Recent/future procedures: none   Recent/future dental: none Patient Assessment Part 2:  Have you MISSED ANY DOSES or CHANGED TABLETS?  YES. States he missed 1 dose "Friday before last", uncertain if more doses may have in fact been missed.  Have you had any BRUISING or BLEEDING ( nose or gum bleeds,blood in urine or stool)?  No reported bruising or bleeding in nose, gums, urine, stool.  Have you STARTED or STOPPED any MEDICATIONS, including OTC meds,herbals or supplements?  No other medications or herbal supplements were started or stopped.  Have you CHANGED your DIET, especially green vegetables,or ALCOHOL intake?  No changes in diet or alcohol intake.  Have you had any ILLNESSES or HOSPITALIZATIONS?  No reported illnesses or hospitalizations  Have you had any signs of CLOTTING?(chest discomfort,dizziness,shortness of breath,arms tingling,slurred speech,swelling or redness in leg)    No chest discomfort, dizziness, shortness of breath, tingling in arm, slurred speech, swelling, or redness in leg.     Treatment  Target INR: 2.0-3.0 INR: 1.5  Date: 09/21/2010 Regimen In:  37.5mg /week INR reflects regimen in: 1.5  New  Tablet strength: : 5mg  Regimen Out:  Sunday: 1 Tablet     Monday: 1 & 1/2 Tablet     Tuesday: 1 Tablet     Wednesday: 1 & 1/2 Tablet     Thursday: 1 Tablet      Friday: 1 & 1/2 Tablet     Saturday: 1 Tablet Total Weekly: 42.5mg /week mg  Next INR Due: 10/05/2010 Adjusted by: Barbera Setters. Alexandria Lodge [name] PharmD CACP   Return to anticoagulation clinic:  10/05/2010 Time of next visit: 0915    Allergies: No Known Drug Allergies

## 2010-10-22 NOTE — Assessment & Plan Note (Addendum)
Summary: pt coming at 1:45pm.  Anticoagulant Therapy Managed by: Barbera Setters. [name] [name]  PharmD CACP PCP: [name] MD [facility] Attending: Darl Pikes, Beth Indication 1: Pulmonary  embolus Indication 2: Encounter for therapeutic drug monitoring  V58.83 Start date: 09/07/2009 Duration: 6 months  Patient Assessment Reviewed by: Chancy Milroy PharmD  October 12, 2010 Medication review: verified warfarin dosage & schedule,verified previous prescription medications, verified doses & any changes, verified new medications, reviewed OTC medications, reviewed OTC health products-vitamins supplements etc Complications: none Dietary changes: none   Health status changes: none   Lifestyle changes: none   Recent/future hospitalizations: none   Recent/future procedures: none   Recent/future dental: none Patient Assessment Part 2:  Have you MISSED ANY DOSES or CHANGED TABLETS?  No missed Warfarin doses or changed tablets.  Have you had any BRUISING or BLEEDING ( nose or gum bleeds,blood in urine or stool)?  No reported bruising or bleeding in nose, gums, urine, stool.  Have you STARTED or STOPPED any MEDICATIONS, including OTC meds,herbals or supplements?  No other medications or herbal supplements were started or stopped.  Have you CHANGED your DIET, especially green vegetables,or ALCOHOL intake?  No changes in diet or alcohol intake.  Have you had any ILLNESSES or HOSPITALIZATIONS?  No reported illnesses or hospitalizations  Have you had any signs of CLOTTING?(chest discomfort,dizziness,shortness of breath,arms tingling,slurred speech,swelling or redness in leg)    No chest discomfort, dizziness, shortness of breath, tingling in arm, slurred speech, swelling, or redness in leg.     Treatment  Target INR: 2.0-3.0 INR: 2.2  Date: 10/12/2010 Regimen In:  42.5mg /week INR reflects regimen in: 2.2  New  Tablet strength: : 5mg  Regimen Out:     Sunday: 1 & 1/2 Tablet     Monday: 1 & 1/2  Tablet     Tuesday: 1 & 1/2 Tablet     Wednesday: 1 & 1/2 Tablet     Thursday: 1 & 1/2 Tablet      Friday: 1 & 1/2 Tablet     Saturday: 1 & 1/2 Tablet Total Weekly: 52.5mg/week mg  Next INR Due: 11/02/2010 Adjusted by: Jupiter Kabir B. Marisol Giambra [name] PharmD CACP   Return to anticoagulation clinic:  11/02/2010 Time of next visit: 1415    Allergies: No Known Drug Allergies Prescriptions: COUMADIN 5 MG TABS (WARFARIN SODIUM) as instructed per pharmD  #45 Tablet x 2   Entered by:   Jay Rad Gramling PharmD   Authorized by:   Beth Golding  DO   Signed by:   Jay Shavontae Gibeault PharmD on 10/12/2010   Method used:   Electronically to        [facility]  [address] [id number] (retail)       [address]       [place]       [place], [place]  [address]       Ph: [phone] or [phone]       Fax: [phone]   RxID:   [id number] COUMADIN 5 MG TABS (WARFARIN SODIUM) as instructed per pharmD  #45 Tablet x 2   Entered by:   Jay Moyinoluwa Dawe PharmD   Authorized by:   Beth Golding  DO   Signed by:   Jay Kahli Fitzgerald PharmD on 10/12/2010   Method used:   Electronically to        [facility]  [address] [id number] (retail)       [address]  [place], [place]  [address]       Ph: [phone] or [phone]       Fax: [phone]   RxID:   [id number]

## 2010-10-22 NOTE — Progress Notes (Signed)
Summary: Walk-in/gg  Phone Note Call from Patient   Summary of Call: Pt walked into clinic with c/o 2 days of pain to left lung area with breathing.  onset 2 days ago.  It's better as the day goes on.  Denies SOB c/o pain, rates pain 5/10.   Hx  PE.  Denies cough, fever or any bleeding.  Pt on coumadin. Initial call taken by: Merrie Roof RN,  September 24, 2010 3:00 PM  Follow-up for Phone Call        Talked with Dr Rogelia Boga and she advises evaluation in ED. We have no open slots. Patient/caller verbalizes understanding of these instructions.  Follow-up by: Merrie Roof RN,  September 24, 2010 3:37 PM

## 2010-10-28 NOTE — Progress Notes (Signed)
Summary: Coumadin check  Phone Note Call from Patient   Caller: Daughter Call For: [name] [name] Summary of Call: Call from pt's daughter asking about pt's last appointment with Dr. Alexandria Lodge.  Daughter said that pt has not been in for 3 weeks.  Daughter was informed that pt had a missed appointment on 08/10/2010.  Appointment was rescheduled for this coming Monday.  Daughter said that if pt needs to come in sooner for a check she will bring him in. Angelina Ok RN  August 19, 2010 10:36 AM  Initial call taken by: Angelina Ok RN,  August 19, 2010 10:36 AM

## 2010-10-28 NOTE — Assessment & Plan Note (Signed)
Summary: COUM/PATIENT COMING IN AT 8:45AM/SB.  Anticoagulant Therapy Managed by: Barbera Setters. Janie Morning  PharmD CACP PCP: [name] MD [facility] Attending: Margarito Liner MD Indication 1: Pulmonary  embolus Indication 2: Encounter for therapeutic drug monitoring  V58.83 Start date: 09/07/2009 Duration: 6 months  Patient Assessment Reviewed by: Chancy Milroy PharmD  August 31, 2010 Medication review: verified warfarin dosage & schedule,verified previous prescription medications, verified doses & any changes, verified new medications, reviewed OTC medications, reviewed OTC health products-vitamins supplements etc Complications: none Dietary changes: none   Health status changes: none   Lifestyle changes: none   Recent/future hospitalizations: none   Recent/future procedures: none   Recent/future dental: none Patient Assessment Part 2:  Have you MISSED ANY DOSES or CHANGED TABLETS?  No missed Warfarin doses or changed tablets.  Have you had any BRUISING or BLEEDING ( nose or gum bleeds,blood in urine or stool)?  No reported bruising or bleeding in nose, gums, urine, stool.  Have you STARTED or STOPPED any MEDICATIONS, including OTC meds,herbals or supplements?  No other medications or herbal supplements were started or stopped.  Have you CHANGED your DIET, especially green vegetables,or ALCOHOL intake?  No changes in diet or alcohol intake.  Have you had any ILLNESSES or HOSPITALIZATIONS?  No reported illnesses or hospitalizations  Have you had any signs of CLOTTING?(chest discomfort,dizziness,shortness of breath,arms tingling,slurred speech,swelling or redness in leg)    No chest discomfort, dizziness, shortness of breath, tingling in arm, slurred speech, swelling, or redness in leg.     Treatment  Target INR: 2.0-3.0 INR: 1.5  Date: 08/31/2010 Regimen In:  37.5mg /week INR reflects regimen in: 1.5  New  Tablet strength: : 5mg  Regimen Out:     Sunday: 1 Tablet     Monday: 1  & 1/2 Tablet     Tuesday: 1 Tablet     Wednesday: 1 & 1/2 Tablet     Thursday: 1 Tablet      Friday: 1 & 1/2 Tablet     Saturday: 1 Tablet Total Weekly: 42.5mg /week mg  Next INR Due: 09/21/2010 Adjusted by: Barbera Setters. Alexandria Lodge [name] PharmD CACP   Return to anticoagulation clinic:  09/21/2010 Time of next visit: 0900   Comments: At last visit--seen by Attending Physician who determined patient had not been taking his warfarin per the last instruction(s) provided (37.5mg /wk). The Attending reinforced the 37.5mg /wk regimen and had the patient return today. Patient verbalized to me appropriately the instructions he was to have been on. Despite this, INR remains sub-therapeutic. Will increase dose.  Allergies: No Known Drug Allergies

## 2010-11-02 ENCOUNTER — Ambulatory Visit (INDEPENDENT_AMBULATORY_CARE_PROVIDER_SITE_OTHER): Payer: Medicare HMO | Admitting: Pharmacist

## 2010-11-02 DIAGNOSIS — Z7901 Long term (current) use of anticoagulants: Secondary | ICD-10-CM

## 2010-11-02 DIAGNOSIS — I2699 Other pulmonary embolism without acute cor pulmonale: Secondary | ICD-10-CM

## 2010-11-02 NOTE — Progress Notes (Signed)
Anti-Coagulation Progress Note  [name] [name] is a 77 y.o. male who is currently on an anti-coagulation regimen.    RECENT RESULTS: Recent results are below, the most recent result is correlated with a dose of 52.5 mg. per week: Lab Results  Component Value Date   INR 4.1* 11/02/2010   INR 2.2 10/12/2010   INR 1.60* 09/24/2010    ANTI-COAG DOSE:   Latest dosing instructions   Total Sun Mon Tue Wed Thu Fri Sat   47.5 7.5 mg 7.5 mg 5 mg 7.5 mg 7.5 mg 5 mg 7.5 mg    (5 mg1.5) (5 mg1.5) (5 mg1) (5 mg1.5) (5 mg1.5) (5 mg1) (5 mg1.5)         ANTICOAG SUMMARY: Anticoagulation Episode Summary              Current INR goal 2.0-3.0 Next INR check    INR from last check 4.1! (11/02/2010)     Weekly max dose (mg) 43 Target end date    Indications Pulmonary embolism, Encounter for long-term use of antiplatelets/antithrombotics   [facility] check [facility]    Send INR reminders to ANTICOAG IMP   Comments        Provider Role Specialty Phone number   Farley Ly, MD  Internal Medicine [phone]        ANTICOAG TODAY: Anticoagulation Summary as of 11/02/2010              INR goal 2.0-3.0     Selected INR 4.1! (11/02/2010) Next INR check    Weekly max dose (mg) 43 Target end date    Indications Pulmonary embolism, Encounter for long-term use of antiplatelets/antithrombotics    Anticoagulation Episode Summary              IN[facility] check [facility] lab    Send INR reminders to ANTICOAG IMP   Comments        Provider Role Specialty Phone number   Farley Ly, MD  Internal Medicine [phone]        PATIENT INSTRUCTIONS: Patient Instructions  Patient instructed to take medications as defined in the Anti-coagulation Track section of this encounter.  Patient instructed to OMIT today's dose.  Patient verbalized understanding of these instructions.        FOLLOW-UP Return in 3 weeks (on 11/23/2010) for Follow up  INR.  [name], [name] Pharm.D., CACP

## 2010-11-02 NOTE — Patient Instructions (Signed)
Patient instructed to take medications as defined in the Anti-coagulation Track section of this encounter.  Patient instructed to OMIT today's dose.  Patient verbalized understanding of these instructions.    

## 2010-11-23 ENCOUNTER — Ambulatory Visit (INDEPENDENT_AMBULATORY_CARE_PROVIDER_SITE_OTHER): Payer: Medicare HMO | Admitting: Pharmacist

## 2010-11-23 DIAGNOSIS — Z7902 Long term (current) use of antithrombotics/antiplatelets: Secondary | ICD-10-CM

## 2010-11-23 DIAGNOSIS — I2699 Other pulmonary embolism without acute cor pulmonale: Secondary | ICD-10-CM

## 2010-11-23 DIAGNOSIS — Z7901 Long term (current) use of anticoagulants: Secondary | ICD-10-CM

## 2010-11-23 LAB — POCT INR: INR: 3.8

## 2010-11-23 NOTE — Progress Notes (Signed)
Anti-Coagulation Progress Note  [name] [name] is a 77 y.o. male who is currently on an anti-coagulation regimen.    RECENT RESULTS: Recent results are below, the most recent result is correlated with a dose of 47.5 mg. per week: Lab Results  Component Value Date   INR 3.8 11/23/2010   INR 4.1* 11/02/2010   INR 2.2 10/12/2010    ANTI-COAG DOSE:   Latest dosing instructions   Total Sun Mon Tue Wed Thu Fri Sat   42.5 5 mg 7.5 mg 5 mg 7.5 mg 5 mg 7.5 mg 5 mg    (5 mg1) (5 mg1.5) (5 mg1) (5 mg1.5) (5 mg1) (5 mg1.5) (5 mg1)         ANTICOAG SUMMARY: Anticoagulation Episode Summary              Current INR goal 2.0-3.0 Next INR check 12/14/2010   INR from last check 3.8! (11/23/2010)     Weekly max dose (mg) 43 Target end date    Indications Pulmonary embolism, Encounter for long-term use of antiplatelets/antithrombotics   [facility] check [facility]    Send INR reminders to ANTICOAG IMP   Comments        Provider Role Specialty Phone number   Farley Ly, MD  Internal Medicine [phone]        ANTICOAG TODAY: Anticoagulation Summary as of 11/23/2010              INR goal 2.0-3.0     Selected INR 3.8! (11/23/2010) Next INR check 12/14/2010   Weekly max dose (mg) 43 Target end date    Indications Pulmonary embolism, Encounter for long-term use of antiplatelets/antithrombotics    Anticoagulation Episode Summary              IN[facility] check location [facility]    Send INR reminders to ANTICOAG [facility]   Comments        Provider Role Specialty Phone number   Farley Ly, MD  Internal Medicine [phone]        PATIENT INSTRUCTIONS: Patient Instructions  Patient instructed to take medications as defined in the Anti-coagulation Track section of this encounter.  Patient instructed to OMIT today's dose.  Patient verbalized understanding of these instructions.        FOLLOW-UP Return in 3 weeks (on 12/14/2010) for Follow  up INR.  [name], [name] Pharm.D., CACP

## 2010-11-23 NOTE — Patient Instructions (Signed)
Patient instructed to take medications as defined in the Anti-coagulation Track section of this encounter.  Patient instructed to OMIT today's dose.  Patient verbalized understanding of these instructions.    

## 2010-12-02 LAB — URINE CULTURE
Colony Count: NO GROWTH
Culture  Setup Time: 201110232358

## 2010-12-02 LAB — CBC
HCT: 36.2 % — ABNORMAL LOW (ref 39.0–52.0)
HCT: 36.2 % — ABNORMAL LOW (ref 39.0–52.0)
HCT: 36.6 % — ABNORMAL LOW (ref 39.0–52.0)
HCT: 40.1 % (ref 39.0–52.0)
Hemoglobin: 11.6 g/dL — ABNORMAL LOW (ref 13.0–17.0)
Hemoglobin: 11.9 g/dL — ABNORMAL LOW (ref 13.0–17.0)
MCH: 28.9 pg (ref 26.0–34.0)
MCH: 29.8 pg (ref 26.0–34.0)
MCHC: 32 g/dL (ref 30.0–36.0)
MCHC: 32.4 g/dL (ref 30.0–36.0)
MCHC: 32.9 g/dL (ref 30.0–36.0)
MCV: 89.8 fL (ref 78.0–100.0)
MCV: 92 fL (ref 78.0–100.0)
Platelets: 169 10*3/uL (ref 150–400)
RDW: 15 % (ref 11.5–15.5)
RDW: 15.1 % (ref 11.5–15.5)
RDW: 15.2 % (ref 11.5–15.5)
WBC: 3.1 10*3/uL — ABNORMAL LOW (ref 4.0–10.5)

## 2010-12-02 LAB — COMPREHENSIVE METABOLIC PANEL
Albumin: 3 g/dL — ABNORMAL LOW (ref 3.5–5.2)
BUN: 9 mg/dL (ref 6–23)
Chloride: 110 mEq/L (ref 96–112)
Creatinine, Ser: 1.07 mg/dL (ref 0.4–1.5)
Glucose, Bld: 87 mg/dL (ref 70–99)
Total Bilirubin: 0.4 mg/dL (ref 0.3–1.2)
Total Protein: 5.8 g/dL — ABNORMAL LOW (ref 6.0–8.3)

## 2010-12-02 LAB — DIFFERENTIAL
Basophils Absolute: 0 10*3/uL (ref 0.0–0.1)
Eosinophils Absolute: 0.5 10*3/uL (ref 0.0–0.7)
Eosinophils Relative: 17 % — ABNORMAL HIGH (ref 0–5)
Lymphocytes Relative: 38 % (ref 12–46)
Monocytes Absolute: 0.2 10*3/uL (ref 0.1–1.0)

## 2010-12-02 LAB — CK TOTAL AND CKMB (NOT AT ARMC)
CK, MB: 6.1 ng/mL (ref 0.3–4.0)
Relative Index: 0.9 (ref 0.0–2.5)
Total CK: 650 U/L — ABNORMAL HIGH (ref 7–232)

## 2010-12-02 LAB — BASIC METABOLIC PANEL
BUN: 8 mg/dL (ref 6–23)
CO2: 27 mEq/L (ref 19–32)
Chloride: 106 mEq/L (ref 96–112)
Creatinine, Ser: 1.1 mg/dL (ref 0.4–1.5)
Glucose, Bld: 90 mg/dL (ref 70–99)
Potassium: 4 mEq/L (ref 3.5–5.1)

## 2010-12-02 LAB — URINALYSIS, ROUTINE W REFLEX MICROSCOPIC
Bilirubin Urine: NEGATIVE
Glucose, UA: NEGATIVE mg/dL
Hgb urine dipstick: NEGATIVE
Ketones, ur: NEGATIVE mg/dL
Ketones, ur: NEGATIVE mg/dL
Nitrite: POSITIVE — AB
Protein, ur: NEGATIVE mg/dL
Specific Gravity, Urine: 1.016 (ref 1.005–1.030)
Urobilinogen, UA: 0.2 mg/dL (ref 0.0–1.0)
pH: 6.5 (ref 5.0–8.0)

## 2010-12-02 LAB — LUPUS ANTICOAGULANT PANEL
DRVVT: 44.6 secs — ABNORMAL HIGH (ref 36.2–44.3)
PTT Lupus Anticoagulant: 52.8 secs — ABNORMAL HIGH (ref 30.0–45.6)
PTTLA 4:1 Mix: 48.4 secs — ABNORMAL HIGH (ref 30.0–45.6)
PTTLA Confirmation: 0 secs (ref <8.0)

## 2010-12-02 LAB — PROTIME-INR
INR: 1.17 (ref 0.00–1.49)
INR: 1.52 — ABNORMAL HIGH (ref 0.00–1.49)
Prothrombin Time: 15.1 seconds (ref 11.6–15.2)

## 2010-12-02 LAB — PROTHROMBIN GENE MUTATION

## 2010-12-02 LAB — POCT I-STAT, CHEM 8
BUN: 10 mg/dL (ref 6–23)
Calcium, Ion: 1.18 mmol/L (ref 1.12–1.32)
Hemoglobin: 14.3 g/dL (ref 13.0–17.0)
TCO2: 27 mmol/L (ref 0–100)

## 2010-12-02 LAB — HEPARIN LEVEL (UNFRACTIONATED): Heparin Unfractionated: 0.68 IU/mL (ref 0.30–0.70)

## 2010-12-02 LAB — PROTEIN C ACTIVITY: Protein C Activity: 73 % — ABNORMAL LOW (ref 75–133)

## 2010-12-02 LAB — CARDIOLIPIN ANTIBODIES, IGG, IGM, IGA
Anticardiolipin IgA: 3 APL U/mL — ABNORMAL LOW (ref <22)
Anticardiolipin IgG: 4 GPL U/mL — ABNORMAL LOW (ref <23)
Anticardiolipin IgM: 2 MPL U/mL — ABNORMAL LOW (ref <11)

## 2010-12-02 LAB — CK: Total CK: 357 U/L — ABNORMAL HIGH (ref 7–232)

## 2010-12-02 LAB — TSH: TSH: 0.714 u[IU]/mL (ref 0.350–4.500)

## 2010-12-02 LAB — TROPONIN I: Troponin I: 0.02 ng/mL (ref 0.00–0.06)

## 2010-12-02 LAB — BETA-2-GLYCOPROTEIN I ABS, IGG/M/A: Beta-2-Glycoprotein I IgM: 11 M Units (ref <20)

## 2010-12-02 LAB — CARDIAC PANEL(CRET KIN+CKTOT+MB+TROPI)
CK, MB: 4.2 ng/mL — ABNORMAL HIGH (ref 0.3–4.0)
Total CK: 523 U/L — ABNORMAL HIGH (ref 7–232)

## 2010-12-02 LAB — POCT CARDIAC MARKERS

## 2010-12-08 LAB — CBC
HCT: 38.2 % — ABNORMAL LOW (ref 39.0–52.0)
Hemoglobin: 13.2 g/dL (ref 13.0–17.0)
MCV: 90.8 fL (ref 78.0–100.0)
Platelets: 157 10*3/uL (ref 150–400)
Platelets: 165 10*3/uL (ref 150–400)
RDW: 14.8 % (ref 11.5–15.5)
WBC: 3 10*3/uL — ABNORMAL LOW (ref 4.0–10.5)
WBC: 3.1 10*3/uL — ABNORMAL LOW (ref 4.0–10.5)

## 2010-12-08 LAB — POCT I-STAT, CHEM 8
BUN: 9 mg/dL (ref 6–23)
BUN: 9 mg/dL (ref 6–23)
Calcium, Ion: 1.14 mmol/L (ref 1.12–1.32)
Chloride: 107 mEq/L (ref 96–112)
Creatinine, Ser: 1.1 mg/dL (ref 0.4–1.5)
Creatinine, Ser: 1.1 mg/dL (ref 0.4–1.5)
Glucose, Bld: 85 mg/dL (ref 70–99)
Glucose, Bld: 86 mg/dL (ref 70–99)
Sodium: 142 mEq/L (ref 135–145)
TCO2: 27 mmol/L (ref 0–100)
TCO2: 32 mmol/L (ref 0–100)

## 2010-12-08 LAB — BASIC METABOLIC PANEL
BUN: 12 mg/dL (ref 6–23)
Creatinine, Ser: 1 mg/dL (ref 0.4–1.5)
GFR calc non Af Amer: >60 mL/min (ref >60)
Potassium: 3.7 mEq/L (ref 3.5–5.1)

## 2010-12-08 LAB — COMPREHENSIVE METABOLIC PANEL
AST: 32 U/L (ref 0–37)
Albumin: 3.6 g/dL (ref 3.5–5.2)
Alkaline Phosphatase: 42 U/L (ref 39–117)
CO2: 28 mEq/L (ref 19–32)
Chloride: 107 mEq/L (ref 96–112)
Creatinine, Ser: 1.06 mg/dL (ref 0.4–1.5)
GFR calc Af Amer: >60 mL/min (ref >60)
GFR calc non Af Amer: >60 mL/min (ref >60)
Potassium: 3.9 mEq/L (ref 3.5–5.1)
Total Bilirubin: 0.8 mg/dL (ref 0.3–1.2)

## 2010-12-08 LAB — URINE MICROSCOPIC-ADD ON

## 2010-12-08 LAB — URINALYSIS, ROUTINE W REFLEX MICROSCOPIC
Bilirubin Urine: NEGATIVE
Glucose, UA: NEGATIVE mg/dL
Hgb urine dipstick: NEGATIVE
Protein, ur: NEGATIVE mg/dL

## 2010-12-08 LAB — DIFFERENTIAL
Basophils Absolute: 0 10*3/uL (ref 0.0–0.1)
Basophils Relative: 1 % (ref 0–1)
Eosinophils Absolute: 0.4 10*3/uL (ref 0.0–0.7)
Eosinophils Relative: 12 % — ABNORMAL HIGH (ref 0–5)
Monocytes Absolute: 0.2 10*3/uL (ref 0.1–1.0)

## 2010-12-08 LAB — URINE CULTURE

## 2010-12-08 LAB — RHEUMATOID FACTOR: Rhuematoid fact SerPl-aCnc: 21 IU/mL — ABNORMAL HIGH (ref 0–20)

## 2010-12-08 LAB — LIPID PANEL
Cholesterol: 163 mg/dL (ref 0–200)
HDL: 58 mg/dL (ref >39)
Triglycerides: 75 mg/dL (ref <150)

## 2010-12-08 LAB — HIV ANTIBODY (ROUTINE TESTING W REFLEX): HIV: NONREACTIVE

## 2010-12-08 LAB — MRSA PCR SCREENING: MRSA by PCR: NEGATIVE

## 2010-12-08 LAB — PROTIME-INR: Prothrombin Time: 27 seconds — ABNORMAL HIGH (ref 11.6–15.2)

## 2010-12-08 LAB — CK TOTAL AND CKMB (NOT AT ARMC): Relative Index: 0.8 (ref 0.0–2.5)

## 2010-12-08 LAB — SEDIMENTATION RATE: Sed Rate: 14 mm/hr (ref 0–16)

## 2010-12-08 LAB — TROPONIN I: Troponin I: 0.01 ng/mL (ref 0.00–0.06)

## 2010-12-14 ENCOUNTER — Ambulatory Visit (INDEPENDENT_AMBULATORY_CARE_PROVIDER_SITE_OTHER): Payer: Medicare HMO | Admitting: Pharmacist

## 2010-12-14 DIAGNOSIS — Z7901 Long term (current) use of anticoagulants: Secondary | ICD-10-CM

## 2010-12-14 DIAGNOSIS — I2699 Other pulmonary embolism without acute cor pulmonale: Secondary | ICD-10-CM

## 2010-12-14 DIAGNOSIS — Z7902 Long term (current) use of antithrombotics/antiplatelets: Secondary | ICD-10-CM

## 2010-12-14 NOTE — Patient Instructions (Signed)
Patient instructed to take medications as defined in the Anti-coagulation Track section of this encounter.  Patient instructed to take today's dose.  Patient verbalized understanding of these instructions.    

## 2010-12-14 NOTE — Progress Notes (Signed)
Anti-Coagulation Progress Note  [name] [name] is a 77 y.o. male who is currently on an anti-coagulation regimen.    RECENT RESULTS: Recent results are below, the most recent result is correlated with a dose of 42.5 mg. per week: Lab Results  Component Value Date   INR 2.2 12/14/2010   INR 3.8 11/23/2010   INR 4.1* 11/02/2010    ANTI-COAG DOSE:   Latest dosing instructions   Total Sun Mon Tue Wed Thu Fri Sat   42.5 5 mg 7.5 mg 5 mg 7.5 mg 5 mg 7.5 mg 5 mg    (5 mg1) (5 mg1.5) (5 mg1) (5 mg1.5) (5 mg1) (5 mg1.5) (5 mg1)         ANTICOAG SUMMARY: Anticoagulation Episode Summary              Current INR goal 2.0-3.0 Next INR check 01/11/2011   INR from last check 2.2 (12/14/2010)     Weekly max dose (mg)  Target end date Indefinite   Indications Pulmonary embolism, Encounter for long-term use of antiplatelets/antithrombotics   IN[facility] check location [facility]    Send INR reminders to ANTICOAG [facility]   Comments        Provider Role Specialty Phone number   Farley Ly, MD  Internal Medicine [phone]        ANTICOAG TODAY: Anticoagulation Summary as of 12/14/2010              INR goal 2.0-3.0     Selected INR 2.2 (12/14/2010) Next INR check 01/11/2011   Weekly max dose (mg)  Target end date Indefinite   Indications Pulmonary embolism, Encounter for long-term use of antiplatelets/antithrombotics    Anticoagulation Episode Summary              IN[facility] check [facility]    Send INR reminders to ANTICOAG [facility]   Comments        Provider Role Specialty Phone number   Farley Ly, MD  Internal Medicine [phone]        PATIENT INSTRUCTIONS: Patient Instructions  Patient instructed to take medications as defined in the Anti-coagulation Track section of this encounter.  Patient instructed to take today's dose.  Patient verbalized understanding of these instructions.        FOLLOW-UP Return in 4 weeks (on  01/11/2011) for Follow up INR.  [name], [name] Pharm.D., CACP

## 2010-12-21 LAB — BASIC METABOLIC PANEL WITH GFR
CO2: 25 meq/L (ref 19–32)
Chloride: 105 meq/L (ref 96–112)
GFR calc Af Amer: >60 mL/min (ref >60)
Glucose, Bld: 106 mg/dL — ABNORMAL HIGH (ref 70–99)
Sodium: 138 meq/L (ref 135–145)

## 2010-12-21 LAB — DIFFERENTIAL
Basophils Absolute: 0 10*3/uL (ref 0.0–0.1)
Basophils Absolute: 0 10*3/uL (ref 0.0–0.1)
Basophils Relative: 0 % (ref 0–1)
Eosinophils Absolute: 0.4 K/uL (ref 0.0–0.7)
Eosinophils Relative: 10 % — ABNORMAL HIGH (ref 0–5)
Eosinophils Relative: 12 % — ABNORMAL HIGH (ref 0–5)
Lymphocytes Relative: 28 % (ref 12–46)
Lymphocytes Relative: 32 % (ref 12–46)
Lymphs Abs: 1 10*3/uL (ref 0.7–4.0)
Monocytes Absolute: 0.2 10*3/uL (ref 0.1–1.0)
Monocytes Absolute: 0.2 K/uL (ref 0.1–1.0)
Monocytes Relative: 5 % (ref 3–12)
Monocytes Relative: 6 % (ref 3–12)
Neutro Abs: 2.1 10*3/uL (ref 1.7–7.7)
Neutrophils Relative %: 57 % (ref 43–77)

## 2010-12-21 LAB — URINALYSIS, ROUTINE W REFLEX MICROSCOPIC
Bilirubin Urine: NEGATIVE
Glucose, UA: NEGATIVE mg/dL
Hgb urine dipstick: NEGATIVE
Ketones, ur: NEGATIVE mg/dL
Nitrite: NEGATIVE
Protein, ur: NEGATIVE mg/dL
Specific Gravity, Urine: 1.013 (ref 1.005–1.030)
Urobilinogen, UA: 0.2 mg/dL (ref 0.0–1.0)
pH: 6.5 (ref 5.0–8.0)

## 2010-12-21 LAB — CBC
HCT: 38.9 % — ABNORMAL LOW (ref 39.0–52.0)
HCT: 39 % (ref 39.0–52.0)
HCT: 40.4 % (ref 39.0–52.0)
HCT: 41.2 % (ref 39.0–52.0)
Hemoglobin: 12.9 g/dL — ABNORMAL LOW (ref 13.0–17.0)
Hemoglobin: 13.1 g/dL (ref 13.0–17.0)
Hemoglobin: 13.7 g/dL (ref 13.0–17.0)
Hemoglobin: 14 g/dL (ref 13.0–17.0)
MCHC: 33.3 g/dL (ref 30.0–36.0)
MCHC: 33.9 g/dL (ref 30.0–36.0)
MCHC: 33.9 g/dL (ref 30.0–36.0)
MCHC: 34 g/dL (ref 30.0–36.0)
MCV: 90.5 fL (ref 78.0–100.0)
MCV: 91.3 fL (ref 78.0–100.0)
Platelets: 168 10*3/uL (ref 150–400)
Platelets: 176 10*3/uL (ref 150–400)
Platelets: 180 10*3/uL (ref 150–400)
RBC: 4.27 MIL/uL (ref 4.22–5.81)
RBC: 4.43 MIL/uL (ref 4.22–5.81)
RDW: 15 % (ref 11.5–15.5)
RDW: 15.3 % (ref 11.5–15.5)
RDW: 15.3 % (ref 11.5–15.5)
RDW: 15.4 % (ref 11.5–15.5)
RDW: 15.5 % (ref 11.5–15.5)
WBC: 3.7 10*3/uL — ABNORMAL LOW (ref 4.0–10.5)

## 2010-12-21 LAB — BASIC METABOLIC PANEL
BUN: 7 mg/dL (ref 6–23)
BUN: 9 mg/dL (ref 6–23)
CO2: 24 mEq/L (ref 19–32)
CO2: 25 mEq/L (ref 19–32)
CO2: 27 mEq/L (ref 19–32)
Calcium: 8.9 mg/dL (ref 8.4–10.5)
Calcium: 9 mg/dL (ref 8.4–10.5)
Calcium: 9.5 mg/dL (ref 8.4–10.5)
Chloride: 107 mEq/L (ref 96–112)
Creatinine, Ser: 1.05 mg/dL (ref 0.4–1.5)
Creatinine, Ser: 1.09 mg/dL (ref 0.4–1.5)
GFR calc Af Amer: >60 mL/min (ref >60)
GFR calc Af Amer: >60 mL/min (ref >60)
GFR calc non Af Amer: >60 mL/min (ref >60)
GFR calc non Af Amer: >60 mL/min (ref >60)
GFR calc non Af Amer: >60 mL/min (ref >60)
Glucose, Bld: 103 mg/dL — ABNORMAL HIGH (ref 70–99)
Glucose, Bld: 117 mg/dL — ABNORMAL HIGH (ref 70–99)
Glucose, Bld: 86 mg/dL (ref 70–99)
Potassium: 3.6 mEq/L (ref 3.5–5.1)
Potassium: 3.7 mEq/L (ref 3.5–5.1)
Potassium: 3.8 mEq/L (ref 3.5–5.1)
Sodium: 140 mEq/L (ref 135–145)
Sodium: 140 mEq/L (ref 135–145)

## 2010-12-21 LAB — POCT CARDIAC MARKERS
CKMB, poc: 2.3 ng/mL (ref 1.0–8.0)
Myoglobin, poc: 104 ng/mL (ref 12–200)
Troponin i, poc: <0.05 ng/mL (ref 0.00–0.09)

## 2010-12-21 LAB — LIPID PANEL
Cholesterol: 228 mg/dL — ABNORMAL HIGH (ref 0–200)
HDL: 65 mg/dL (ref >39)
Total CHOL/HDL Ratio: 3.5 RATIO
Triglycerides: 56 mg/dL (ref <150)

## 2010-12-21 LAB — TROPONIN I
Troponin I: 0.02 ng/mL (ref 0.00–0.06)
Troponin I: 0.02 ng/mL (ref 0.00–0.06)

## 2010-12-21 LAB — APTT: aPTT: 27 seconds (ref 24–37)

## 2010-12-21 LAB — PROTIME-INR
INR: 1.08 (ref 0.00–1.49)
INR: 1.31 (ref 0.00–1.49)
Prothrombin Time: 13.9 seconds (ref 11.6–15.2)
Prothrombin Time: 15.2 seconds (ref 11.6–15.2)
Prothrombin Time: 16.2 seconds — ABNORMAL HIGH (ref 11.6–15.2)

## 2010-12-21 LAB — B. BURGDORFI ANTIBODIES BY WB: B burgdorferi IgM Abs (IB): NEGATIVE

## 2010-12-21 LAB — HEPARIN LEVEL (UNFRACTIONATED): Heparin Unfractionated: 0.99 IU/mL — ABNORMAL HIGH (ref 0.30–0.70)

## 2010-12-21 LAB — HEMOGLOBIN A1C: Mean Plasma Glucose: 143 mg/dL

## 2010-12-21 LAB — D-DIMER, QUANTITATIVE: D-Dimer, Quant: 2.27 ug/mL-FEU — ABNORMAL HIGH (ref 0.00–0.48)

## 2011-01-11 ENCOUNTER — Ambulatory Visit: Payer: Medicare HMO

## 2011-01-25 ENCOUNTER — Emergency Department (HOSPITAL_COMMUNITY): Payer: Medicare HMO

## 2011-01-25 ENCOUNTER — Emergency Department (HOSPITAL_COMMUNITY)
Admission: EM | Admit: 2011-01-25 | Discharge: 2011-01-25 | Disposition: A | Discharge status: Home / Self Care | Payer: Medicare HMO | Attending: Emergency Medicine | Admitting: Emergency Medicine

## 2011-01-25 DIAGNOSIS — R0602 Shortness of breath: Secondary | ICD-10-CM | POA: Insufficient documentation

## 2011-01-25 DIAGNOSIS — I10 Essential (primary) hypertension: Secondary | ICD-10-CM | POA: Insufficient documentation

## 2011-01-25 DIAGNOSIS — Z7901 Long term (current) use of anticoagulants: Secondary | ICD-10-CM | POA: Insufficient documentation

## 2011-01-25 DIAGNOSIS — Z8546 Personal history of malignant neoplasm of prostate: Secondary | ICD-10-CM | POA: Insufficient documentation

## 2011-01-25 DIAGNOSIS — R0609 Other forms of dyspnea: Secondary | ICD-10-CM | POA: Insufficient documentation

## 2011-01-25 DIAGNOSIS — Z7982 Long term (current) use of aspirin: Secondary | ICD-10-CM | POA: Insufficient documentation

## 2011-01-25 DIAGNOSIS — J189 Pneumonia, unspecified organism: Secondary | ICD-10-CM | POA: Insufficient documentation

## 2011-01-25 DIAGNOSIS — I2510 Atherosclerotic heart disease of native coronary artery without angina pectoris: Secondary | ICD-10-CM | POA: Insufficient documentation

## 2011-01-25 DIAGNOSIS — Z79899 Other long term (current) drug therapy: Secondary | ICD-10-CM | POA: Insufficient documentation

## 2011-01-25 DIAGNOSIS — Z86711 Personal history of pulmonary embolism: Secondary | ICD-10-CM | POA: Insufficient documentation

## 2011-01-25 DIAGNOSIS — M549 Dorsalgia, unspecified: Secondary | ICD-10-CM | POA: Insufficient documentation

## 2011-01-25 DIAGNOSIS — K219 Gastro-esophageal reflux disease without esophagitis: Secondary | ICD-10-CM | POA: Insufficient documentation

## 2011-01-25 DIAGNOSIS — R071 Chest pain on breathing: Secondary | ICD-10-CM | POA: Insufficient documentation

## 2011-01-25 DIAGNOSIS — R0989 Other specified symptoms and signs involving the circulatory and respiratory systems: Secondary | ICD-10-CM | POA: Insufficient documentation

## 2011-01-25 LAB — PROTIME-INR: Prothrombin Time: 22 seconds — ABNORMAL HIGH (ref 11.6–15.2)

## 2011-01-25 LAB — DIFFERENTIAL
Basophils Relative: 1 % (ref 0–1)
Lymphocytes Relative: 42 % (ref 12–46)
Lymphs Abs: 1.5 10*3/uL (ref 0.7–4.0)
Monocytes Absolute: 0.3 10*3/uL (ref 0.1–1.0)
Monocytes Relative: 7 % (ref 3–12)
Neutro Abs: 1.3 10*3/uL — ABNORMAL LOW (ref 1.7–7.7)
Neutrophils Relative %: 38 % — ABNORMAL LOW (ref 43–77)

## 2011-01-25 LAB — BASIC METABOLIC PANEL
CO2: 28 mEq/L (ref 19–32)
Calcium: 9.9 mg/dL (ref 8.4–10.5)
GFR calc Af Amer: >60 mL/min (ref >60)
GFR calc non Af Amer: >60 mL/min (ref >60)
Potassium: 4.4 mEq/L (ref 3.5–5.1)
Sodium: 139 mEq/L (ref 135–145)

## 2011-01-25 LAB — POCT CARDIAC MARKERS: Myoglobin, poc: 128 ng/mL (ref 12–200)

## 2011-01-25 LAB — CBC
HCT: 40.6 % (ref 39.0–52.0)
Hemoglobin: 13.2 g/dL (ref 13.0–17.0)
MCH: 29.3 pg (ref 26.0–34.0)
RBC: 4.5 MIL/uL (ref 4.22–5.81)

## 2011-01-25 MED ORDER — IOHEXOL 300 MG/ML  SOLN
100.0000 mL | Freq: Once | INTRAMUSCULAR | Status: AC | PRN
  Administered 2011-01-25: 100 mL via INTRAVENOUS

## 2011-01-26 ENCOUNTER — Telehealth: Payer: Self-pay | Admitting: *Deleted

## 2011-01-26 NOTE — Telephone Encounter (Signed)
Spoke w/ dr Meredith Pel and dr Alexandria Lodge, will call pt w/ mon at 2pm appt per dr groce, pt will continue meds as he has been taking and continue zpack, pt caregiver verbally repeats instructions and understanding

## 2011-01-26 NOTE — Telephone Encounter (Signed)
Family member calls and leaves message that pt was in ED 5/7, was told INR nontherapeutic and called for appt asap, was given appt in clinic for 5/14. Please review lab results and ED visit and advise.  Thank you, helen

## 2011-01-26 NOTE — Telephone Encounter (Signed)
Please have Dr. Alexandria Lodge review this and advise.

## 2011-01-27 ENCOUNTER — Other Ambulatory Visit: Payer: Self-pay | Admitting: Internal Medicine

## 2011-01-27 NOTE — Telephone Encounter (Signed)
Agreed with plan and will re-evaluate his symptoms and INR on the scheduled appointment on 5/14 or earlier if his symptoms become worse.   Thanks.

## 2011-02-01 ENCOUNTER — Ambulatory Visit: Payer: Medicare HMO

## 2011-02-02 NOTE — Cardiovascular Report (Signed)
NAMEJARMEL, LINHARDT NO.:  [id number]   MEDICAL RECORD NO.:  [id number]          PATIENT TYPE:  OIB   LOCATION:  1963                         FACILITY:  [facility]   PHYSICIAN:  Rollene Rotunda, MD, FACCDATE OF BIRTH:  1931/07/16   DATE OF PROCEDURE:  04/05/2007  DATE OF DISCHARGE:                            CARDIAC CATHETERIZATION   PRIMARY CARE PHYSICIAN:  Dorise Hiss, M.D.   CARDIOLOGIST:  Learta Codding, MD,FACC   PROCEDURE:  Left heart catheterization/coronary arteriography.   INDICATION:  Evaluate patient with chest discomfort and a Cardiolite  suggesting inferior wall infarction.   PROCEDURE NOTE:  Left heart catheterization was performed via the right  femoral artery.  The artery was cannulated using anterior wall puncture.  A #4-French arterial sheath was inserted via the modified Seldinger  technique.  Preformed Judkins and a pigtail catheter were utilized.  The  patient tolerated the procedure well and left the lab in stable  condition.   RESULTS:  Hemodynamics LV 117/14, AO 118/92.  Coronaries - the left main  had a mid calcified segment with 25% stenosis.  This extended into the  proximal LAD.  The LAD was moderately calcified.  There was long  proximal 25% stenosis.  There was proximal 30% stenosis at the first  diagonal.  There was diffuse mid to distal 50% stenosis in a narrow  caliber vessel.  The first diagonal was large with long proximal 50%  stenosis.  A second diagonal was small and normal.  Circumflex in the AV  groove was a very small vessel.  It was normal through its course.  A  ramus intermediate was long but narrow and had no high-grade  obstruction.  The right coronary artery is a dominant vessel.  There  were long luminal irregularities in the proximal mid segment.  There was  a long mid 25% stenosis.  The PDA had proximal 30% stenosis and was a  moderate sized vessel.  There were 3 tiny posterolaterals which were  normal.   LEFT VENTRICULOGRAM:  The left ventriculogram was obtained in the RAO  projection.  The EF was 65% with normal wall motion.   CONCLUSION:  Nonobstructive coronary artery disease with diffuse plaque.  There was mild to moderate calcification.   PLAN:  Based on the above, the patient needs aggressive primary risk  reduction.  The etiology of his chest discomfort is either nonanginal or  potentially small vessel disease.  He will be referred back to Dr.  Andee Lineman and Gene Serpe, PA-C in [place] for further management.  He will  follow with Dr. Anne Hahn.      Rollene Rotunda, MD, [facility]  Electronically Signed     JH/MEDQ  D:  04/05/2007  T:  04/06/2007  Job:  [id number]   cc:   Learta Codding, MD,FACC  Dorise Hiss, M.D.  [facility] [place]

## 2011-02-03 ENCOUNTER — Encounter: Payer: Medicare HMO | Admitting: Internal Medicine

## 2011-02-05 NOTE — Cardiovascular Report (Signed)
[name], [name] NO.:  [id number]   MEDICAL RECORD NO.:  [id number]          PATIENT TYPE:  OIB   LOCATION:  6501                         FACILITY:  [facility]   PHYSICIAN:  Charlton Haws, M.D.     DATE OF BIRTH:  September 24, 1930   DATE OF PROCEDURE:  DATE OF DISCHARGE:                              CARDIAC CATHETERIZATION   INDICATIONS:  A 77 year old patient with recurrent chest pain once requiring  hospitalization.  Cardiolite suggesting inferior basal wall infarction.   Catheterization was done with a 4-French catheter in the right femoral  artery.   The faint coronary artery had a 20% stenosis.   Left anterior descending coronary had 30-40% disease in the proximal and mid  portion.  Distal vessel was normal sized disease.  The first diagonal branch  had 30% proximal disease.  The second diagonal branch had 30% ostial  disease.   The circumflex coronary artery was very small with no significant disease.  We actually did a flush injection of the right coronary cusp looking for an  anomalus circumflex and did not see any anomalus origins to the circumflex.   The right coronary artery was dominant and normal.   RIGHT VENTRICULOGRAPHY:  Right ventriculography was normal EF of 60%.  There  was no gradient across the aortic valve and no MR.  LV pressure was 130/12.  Aortic pressure was 130/76.   IMPRESSION:  The patient has no critical coronary artery disease.  He will  follow up with Dr. Anne Hahn and Dr. Diona Browner in regards to risk factor  modification.  Dr. Anne Hahn can workup noncardiac etiologies to his chest pain.  The patient tolerated the procedure well.       PN/MEDQ  D:  06/29/2004  T:  06/29/2004  Job:  [id number]   cc:   Jonelle Sidle, M.D. [facility]   Dorise Hiss, M.D.  [address]  [place]  [place] [address]  Fax: [phone]

## 2011-02-08 ENCOUNTER — Ambulatory Visit (INDEPENDENT_AMBULATORY_CARE_PROVIDER_SITE_OTHER): Payer: Medicare HMO | Admitting: Pharmacist

## 2011-02-08 DIAGNOSIS — Z7901 Long term (current) use of anticoagulants: Secondary | ICD-10-CM

## 2011-02-08 DIAGNOSIS — Z7902 Long term (current) use of antithrombotics/antiplatelets: Secondary | ICD-10-CM

## 2011-02-08 DIAGNOSIS — I2699 Other pulmonary embolism without acute cor pulmonale: Secondary | ICD-10-CM

## 2011-02-08 MED ORDER — WARFARIN SODIUM 5 MG PO TABS: ORAL_TABLET | ORAL | Status: DC

## 2011-02-08 NOTE — Progress Notes (Signed)
Anti-Coagulation Progress Note  [name] [name] is a 77 y.o. male who is currently on an anti-coagulation regimen.    RECENT RESULTS: Recent results are below, the most recent result is correlated with a dose of 42.5 mg. per week: Lab Results  Component Value Date   INR 2.60 02/08/2011   INR 1.91* 01/25/2011   INR 2.2 12/14/2010    ANTI-COAG DOSE:   Latest dosing instructions   Total Sun Mon Tue Wed Thu Fri Sat   42.5 5 mg 7.5 mg 5 mg 7.5 mg 5 mg 7.5 mg 5 mg    (5 mg1) (5 mg1.5) (5 mg1) (5 mg1.5) (5 mg1) (5 mg1.5) (5 mg1)         ANTICOAG SUMMARY: Anticoagulation Episode Summary              Current INR goal 2.0-3.0 Next INR check 03/08/2011   INR from last check 2.60 (02/08/2011)     Weekly max dose (mg)  Target end date Indefinite   Indications Pulmonary embolism, Encounter for long-term use of antiplatelets/antithrombotics   [facility]    Send INR reminders to ANTICOAG [facility]   Comments        Provider Role Specialty Phone number   Farley Ly, MD  Internal Medicine [phone]        ANTICOAG TODAY: Anticoagulation Summary as of 02/08/2011              INR goal 2.0-3.0     Selected INR 2.60 (02/08/2011) Next INR check 03/08/2011   Weekly max dose (mg)  Target end date Indefinite   Indications Pulmonary embolism, Encounter for long-term use of antiplatelets/antithrombotics    Anticoagulation Episode Summary              IN[facility] check [facility]    Send INR reminders to ANTICOAG [facility]   Comments        Provider Role Specialty Phone number   Farley Ly, MD  Internal Medicine [phone]        PATIENT INSTRUCTIONS: Patient Instructions  Patient instructed to take medications as defined in the Anti-coagulation Track section of this encounter.  Patient instructed to take today's dose.  Patient verbalized understanding of these instructions.        FOLLOW-UP Return in 4 weeks (on  03/08/2011) for Follow up INR.  [name], [name] Pharm.D., CACP

## 2011-02-08 NOTE — Patient Instructions (Signed)
Patient instructed to take medications as defined in the Anti-coagulation Track section of this encounter.  Patient instructed to take today's dose.  Patient verbalized understanding of these instructions.    

## 2011-02-16 ENCOUNTER — Encounter: Payer: Self-pay | Admitting: Internal Medicine

## 2011-02-24 ENCOUNTER — Encounter: Payer: Self-pay | Admitting: Internal Medicine

## 2011-02-24 ENCOUNTER — Ambulatory Visit (INDEPENDENT_AMBULATORY_CARE_PROVIDER_SITE_OTHER): Payer: Medicare HMO | Admitting: Internal Medicine

## 2011-02-24 VITALS — BP 136/90 | HR 62 | Temp 97.0°F | Ht 68.5 in | Wt 172.9 lb

## 2011-02-24 DIAGNOSIS — I10 Essential (primary) hypertension: Secondary | ICD-10-CM

## 2011-02-24 DIAGNOSIS — E785 Hyperlipidemia, unspecified: Secondary | ICD-10-CM

## 2011-02-24 DIAGNOSIS — I2699 Other pulmonary embolism without acute cor pulmonale: Secondary | ICD-10-CM

## 2011-02-24 NOTE — Assessment & Plan Note (Signed)
LDL at goal of 90 one year ago and will continue statin. Recheck FLP.

## 2011-02-24 NOTE — Patient Instructions (Signed)
Please take all your medications as instructed in your instructions.   We will call you if any abnormal lab results.  Please come to the Clinic for appointment with DR. Groce to check your coumadin.

## 2011-02-24 NOTE — Assessment & Plan Note (Signed)
Lab Results  Component Value Date   NA 139 01/25/2011   K 4.4 01/25/2011   CL 103 01/25/2011   CO2 28 01/25/2011   BUN 13 01/25/2011   CREATININE 1.11 01/25/2011    BP Readings from Last 3 Encounters:  02/24/11 136/90  08/06/10 141/91  07/20/10 136/82    His BP well controlled and asymptomatic. Will continue norvasc and lisinopril. Will recheck BP at next visit.

## 2011-02-24 NOTE — Assessment & Plan Note (Signed)
His recent INR at goal and will f/u by Dr. Alexandria Lodge in 2 weeks to recheck INR and adjust coumadin dose then. He will likely take it for his lifetime for the recurrent PE.

## 2011-02-24 NOTE — Progress Notes (Signed)
  Subjective:    Patient ID: [name] [name], male    DOB: May 16, 1931, 77 y.o.   MRN: [id number]  HPI Patient is a years 5 old male with past medical history  as outlined here who comes to the Clinic for regular f/u. He has been doing well, no fever, chill, chest pain, shortness of breath, hemoptysis, abdominal pain, nausea, vomiting, diarrhea, melena, dysuria, significant weight change. Denies recent smoking, alcohol or drug abuse. Has been taking all his medications as instructed.  He has been taking coumadin for recurrent PE and f/u by Dr. Alexandria Lodge, last INR 2.6 on 02/08/2011.   Review of Systems Per HPI.  Current Outpatient Medications Current Outpatient Prescriptions  Medication Sig Dispense Refill  . ALPRAZolam (XANAX) 0.5 MG tablet Take 0.5 mg by mouth 2 (two) times daily as needed.        [name] amLODipine (NORVASC) 10 MG tablet Take 10 mg by mouth daily.        [name] aspirin 81 MG tablet Take 81 mg by mouth daily.        [name] lisinopril (PRINIVIL,ZESTRIL) 40 MG tablet Take 40 mg by mouth daily.        . simvastatin (ZOCOR) 40 MG tablet Take 40 mg by mouth at bedtime.        [name] warfarin (JANTOVEN) 5 MG tablet Take as directed by anticoagulation clinic provider.  100 tablet  1    Allergies Review of patient's allergies indicates no known allergies.  Past Medical History  Diagnosis Date  . PE (pulmonary embolism)     unprovoked PE completed 6 months of warfarin, warfarin d/ced 02/12/2010, repeat PE 07/10/2010 after a long car ride and now on coumadin.  [name] Hypertension   . CVA (cerebral vascular accident)     MRI 12/10 negative except small vessel changes, CT 12/2009 old R thalamic infarct  . Pituitary microadenoma     incidental finding CT 12/2009    No past surgical history on file.     Objective:   Physical Exam General: Vital signs reviewed and noted. Well-developed, well-nourished, in no acute distress; alert, appropriate and cooperative throughout examination.  Head: Normocephalic,  atraumatic.  Neck: No deformities, masses, or tenderness noted.  Lungs:  Normal respiratory effort. Clear to auscultation BL without crackles or wheezes.  Heart: RRR. S1 and S2 normal without gallop, murmur, or rubs.  Abdomen:  BS normoactive. Soft, Nondistended, non-tender.  No masses or organomegaly.  Extremities: No pretibial edema.                                  Assessment & Plan:

## 2011-03-08 ENCOUNTER — Ambulatory Visit: Payer: Medicare HMO

## 2011-03-08 ENCOUNTER — Other Ambulatory Visit: Payer: Medicare HMO

## 2011-03-08 ENCOUNTER — Emergency Department (HOSPITAL_COMMUNITY)
Admission: EM | Admit: 2011-03-08 | Discharge: 2011-03-08 | Disposition: A | Discharge status: Home / Self Care | Payer: Medicare HMO | Attending: Emergency Medicine | Admitting: Emergency Medicine

## 2011-03-08 DIAGNOSIS — Z8546 Personal history of malignant neoplasm of prostate: Secondary | ICD-10-CM | POA: Insufficient documentation

## 2011-03-08 DIAGNOSIS — I10 Essential (primary) hypertension: Secondary | ICD-10-CM | POA: Insufficient documentation

## 2011-03-08 DIAGNOSIS — Z79899 Other long term (current) drug therapy: Secondary | ICD-10-CM | POA: Insufficient documentation

## 2011-03-08 DIAGNOSIS — N501 Vascular disorders of male genital organs: Secondary | ICD-10-CM | POA: Insufficient documentation

## 2011-03-08 DIAGNOSIS — R319 Hematuria, unspecified: Secondary | ICD-10-CM | POA: Insufficient documentation

## 2011-03-08 DIAGNOSIS — N390 Urinary tract infection, site not specified: Secondary | ICD-10-CM | POA: Insufficient documentation

## 2011-03-08 DIAGNOSIS — K219 Gastro-esophageal reflux disease without esophagitis: Secondary | ICD-10-CM | POA: Insufficient documentation

## 2011-03-08 DIAGNOSIS — Z7901 Long term (current) use of anticoagulants: Secondary | ICD-10-CM | POA: Insufficient documentation

## 2011-03-08 DIAGNOSIS — I2510 Atherosclerotic heart disease of native coronary artery without angina pectoris: Secondary | ICD-10-CM | POA: Insufficient documentation

## 2011-03-08 DIAGNOSIS — Z86718 Personal history of other venous thrombosis and embolism: Secondary | ICD-10-CM | POA: Insufficient documentation

## 2011-03-08 DIAGNOSIS — R300 Dysuria: Secondary | ICD-10-CM | POA: Insufficient documentation

## 2011-03-08 LAB — DIFFERENTIAL
Eosinophils Absolute: 0.3 10*3/uL (ref 0.0–0.7)
Lymphs Abs: 0.9 10*3/uL (ref 0.7–4.0)
Monocytes Relative: 9 % (ref 3–12)
Neutro Abs: 3.2 10*3/uL (ref 1.7–7.7)
Neutrophils Relative %: 66 % (ref 43–77)

## 2011-03-08 LAB — URINE MICROSCOPIC-ADD ON

## 2011-03-08 LAB — URINALYSIS, ROUTINE W REFLEX MICROSCOPIC
Bilirubin Urine: NEGATIVE
Glucose, UA: NEGATIVE mg/dL
Nitrite: POSITIVE — AB
Specific Gravity, Urine: 1.019 (ref 1.005–1.030)
pH: 6 (ref 5.0–8.0)

## 2011-03-08 LAB — COMPREHENSIVE METABOLIC PANEL
Albumin: 3.6 g/dL (ref 3.5–5.2)
BUN: 10 mg/dL (ref 6–23)
Calcium: 9.1 mg/dL (ref 8.4–10.5)
Creatinine, Ser: 0.93 mg/dL (ref 0.50–1.35)
Total Protein: 7.1 g/dL (ref 6.0–8.3)

## 2011-03-08 LAB — CBC
Hemoglobin: 12.6 g/dL — ABNORMAL LOW (ref 13.0–17.0)
MCV: 90.1 fL (ref 78.0–100.0)
Platelets: 153 10*3/uL (ref 150–400)
RBC: 4.26 MIL/uL (ref 4.22–5.81)
WBC: 4.9 10*3/uL (ref 4.0–10.5)

## 2011-03-08 LAB — PROTIME-INR
INR: 3.03 — ABNORMAL HIGH (ref 0.00–1.49)
Prothrombin Time: 31.9 seconds — ABNORMAL HIGH (ref 11.6–15.2)

## 2011-03-08 LAB — APTT: aPTT: 39 seconds — ABNORMAL HIGH (ref 24–37)

## 2011-03-10 LAB — URINE CULTURE

## 2011-03-15 ENCOUNTER — Telehealth: Payer: Self-pay | Admitting: *Deleted

## 2011-03-15 ENCOUNTER — Other Ambulatory Visit: Payer: Self-pay | Admitting: *Deleted

## 2011-03-15 MED ORDER — WARFARIN SODIUM 5 MG PO TABS: ORAL_TABLET | ORAL | Status: DC

## 2011-03-15 NOTE — Telephone Encounter (Signed)
Pt rep will call back

## 2011-03-29 ENCOUNTER — Ambulatory Visit: Payer: Medicare HMO

## 2011-04-05 ENCOUNTER — Ambulatory Visit (INDEPENDENT_AMBULATORY_CARE_PROVIDER_SITE_OTHER): Payer: Medicare HMO | Admitting: Pharmacist

## 2011-04-05 DIAGNOSIS — I2699 Other pulmonary embolism without acute cor pulmonale: Secondary | ICD-10-CM

## 2011-04-05 DIAGNOSIS — Z7902 Long term (current) use of antithrombotics/antiplatelets: Secondary | ICD-10-CM

## 2011-04-05 DIAGNOSIS — Z7901 Long term (current) use of anticoagulants: Secondary | ICD-10-CM

## 2011-04-05 NOTE — Progress Notes (Signed)
Anti-Coagulation Progress Note  [name] [name] is a 77 y.o. male who is currently on an anti-coagulation regimen.    RECENT RESULTS: Recent results are below, the most recent result is correlated with a dose of 42.5 mg. per week: Lab Results  Component Value Date   INR 2.0 04/05/2011   INR 3.03* 03/08/2011   INR 2.60 02/08/2011    ANTI-COAG DOSE:   Latest dosing instructions   Total Sun Mon Tue Wed Thu Fri Sat   47.5 5 mg 7.5 mg 7.5 mg 7.5 mg 7.5 mg 7.5 mg 5 mg    (5 mg1) (5 mg1.5) (5 mg1.5) (5 mg1.5) (5 mg1.5) (5 mg1.5) (5 mg1)         ANTICOAG SUMMARY: Anticoagulation Episode Summary              Current INR goal 2.0-3.0 Next INR check 05/03/2011   INR from last check 2.0 (04/05/2011)     Weekly max dose (mg)  Target end date Indefinite   Indications Pulmonary embolism, Encounter for long-term use of antiplatelets/antithrombotics   [facility]    Send INR reminders to ANTICOAG [facility]   Comments        Provider Role Specialty Phone number   Farley Ly, MD  Internal Medicine [phone]        ANTICOAG TODAY: Anticoagulation Summary as of 04/05/2011              INR goal 2.0-3.0     Selected INR 2.0 (04/05/2011) Next INR check 05/03/2011   Weekly max dose (mg)  Target end date Indefinite   Indications Pulmonary embolism, Encounter for long-term use of antiplatelets/antithrombotics    Anticoagulation Episode Summary              [facility] check [facility]    Send INR reminders to ANTICOAG [facility]   Comments        Provider Role Specialty Phone number   Farley Ly, MD  Internal Medicine [phone]        PATIENT INSTRUCTIONS: Patient Instructions  Patient instructed to take medications as defined in the Anti-coagulation Track section of this encounter.  Patient instructed to take today's dose.  Patient verbalized understanding of these instructions.        FOLLOW-UP Return in 4  weeks (on 05/03/2011) for Follow up INR.  [name], [name] Pharm.D., CACP

## 2011-04-05 NOTE — Patient Instructions (Signed)
Patient instructed to take medications as defined in the Anti-coagulation Track section of this encounter.  Patient instructed to take today's dose.  Patient verbalized understanding of these instructions.    

## 2011-05-03 ENCOUNTER — Encounter: Payer: Self-pay | Admitting: Internal Medicine

## 2011-05-03 ENCOUNTER — Ambulatory Visit (INDEPENDENT_AMBULATORY_CARE_PROVIDER_SITE_OTHER): Payer: Medicare HMO | Admitting: Pharmacist

## 2011-05-03 DIAGNOSIS — I2699 Other pulmonary embolism without acute cor pulmonale: Secondary | ICD-10-CM

## 2011-05-03 DIAGNOSIS — Z7902 Long term (current) use of antithrombotics/antiplatelets: Secondary | ICD-10-CM

## 2011-05-03 DIAGNOSIS — Z7901 Long term (current) use of anticoagulants: Secondary | ICD-10-CM

## 2011-05-03 LAB — POCT INR: INR: 2.9

## 2011-05-03 NOTE — Progress Notes (Signed)
Anti-Coagulation Progress Note  [name] [name] is a 77 y.o. male who is currently on an anti-coagulation regimen.    RECENT RESULTS: Recent results are below, the most recent result is correlated with a dose of 47.5 mg. per week: Lab Results  Component Value Date   INR 2.90 05/03/2011   INR 2.0 04/05/2011   INR 3.03* 03/08/2011    ANTI-COAG DOSE:   Latest dosing instructions   Total Sun Mon Tue Wed Thu Fri Sat   45 5 mg 7.5 mg 7.5 mg 5 mg 7.5 mg 7.5 mg 5 mg    (5 mg1) (5 mg1.5) (5 mg1.5) (5 mg1) (5 mg1.5) (5 mg1.5) (5 mg1)         ANTICOAG SUMMARY: Anticoagulation Episode Summary              Current INR goal 2.0-3.0 Next INR check 05/31/2011   INR from last check 2.90 (05/03/2011)     Weekly max dose (mg)  Target end date Indefinite   Indications Pulmonary embolism, Encounter for long-term use of antiplatelets/antithrombotics   [facility] check location [facility]    Send INR reminders to [facility]   Comments        Provider Role Specialty Phone number   Farley Ly, MD  Internal Medicine [phone]        ANTICOAG TODAY: Anticoagulation Summary as of 05/03/2011              INR goal 2.0-3.0     Selected INR 2.90 (05/03/2011) Next INR check 05/31/2011   Weekly max dose (mg)  Target end date Indefinite   Indications Pulmonary embolism, Encounter for long-term use of antiplatelets/antithrombotics    Anticoagulation Episode Summary              INR check location [facility]erred lab    Send INR reminders to [facility]   Comments        Provider Role Specialty Phone number   Farley Ly, MD  Internal Medicine [phone]        PATIENT INSTRUCTIONS: Patient Instructions  Patient instructed to take medications as defined in the Anti-coagulation Track section of this encounter.  Patient instructed to take today's dose.  Patient verbalized understanding of these instructions.        FOLLOW-UP Return in 4 weeks  (on 05/31/2011) for Follow up INR.  [name], [name] Pharm.D., CACP

## 2011-05-03 NOTE — Patient Instructions (Signed)
Patient instructed to take medications as defined in the Anti-coagulation Track section of this encounter.  Patient instructed to take today's dose.  Patient verbalized understanding of these instructions.    

## 2011-05-03 NOTE — Progress Notes (Signed)
Apparently Mr. Kohn's indication for prolonged anticoagulation is recurrent pulmonary emboli.  Agree with the plan.

## 2011-05-12 ENCOUNTER — Emergency Department (HOSPITAL_COMMUNITY)
Admission: EM | Admit: 2011-05-12 | Discharge: 2011-05-13 | Disposition: A | Discharge status: Home / Self Care | Payer: Medicare HMO | Attending: Emergency Medicine | Admitting: Emergency Medicine

## 2011-05-12 DIAGNOSIS — I10 Essential (primary) hypertension: Secondary | ICD-10-CM | POA: Insufficient documentation

## 2011-05-12 DIAGNOSIS — I2510 Atherosclerotic heart disease of native coronary artery without angina pectoris: Secondary | ICD-10-CM | POA: Insufficient documentation

## 2011-05-12 DIAGNOSIS — Z86718 Personal history of other venous thrombosis and embolism: Secondary | ICD-10-CM | POA: Insufficient documentation

## 2011-05-12 DIAGNOSIS — G319 Degenerative disease of nervous system, unspecified: Secondary | ICD-10-CM | POA: Insufficient documentation

## 2011-05-12 DIAGNOSIS — Z8546 Personal history of malignant neoplasm of prostate: Secondary | ICD-10-CM | POA: Insufficient documentation

## 2011-05-12 DIAGNOSIS — Z7901 Long term (current) use of anticoagulants: Secondary | ICD-10-CM | POA: Insufficient documentation

## 2011-05-12 DIAGNOSIS — R4182 Altered mental status, unspecified: Secondary | ICD-10-CM | POA: Insufficient documentation

## 2011-05-12 DIAGNOSIS — F29 Unspecified psychosis not due to a substance or known physiological condition: Secondary | ICD-10-CM | POA: Insufficient documentation

## 2011-05-13 ENCOUNTER — Emergency Department (HOSPITAL_COMMUNITY): Payer: Medicare HMO

## 2011-05-13 ENCOUNTER — Encounter (HOSPITAL_COMMUNITY): Payer: Self-pay

## 2011-05-13 LAB — DIFFERENTIAL
Basophils Absolute: 0 10*3/uL (ref 0.0–0.1)
Eosinophils Relative: 3 % (ref 0–5)
Lymphocytes Relative: 41 % (ref 12–46)
Neutrophils Relative %: 46 % (ref 43–77)

## 2011-05-13 LAB — CBC
HCT: 39.6 % (ref 39.0–52.0)
Platelets: 187 10*3/uL (ref 150–400)
RDW: 15 % (ref 11.5–15.5)
WBC: 3.6 10*3/uL — ABNORMAL LOW (ref 4.0–10.5)

## 2011-05-13 LAB — PROTIME-INR
INR: 3.85 — ABNORMAL HIGH (ref 0.00–1.49)
Prothrombin Time: 38.4 seconds — ABNORMAL HIGH (ref 11.6–15.2)

## 2011-05-13 LAB — URINE MICROSCOPIC-ADD ON

## 2011-05-13 LAB — URINALYSIS, ROUTINE W REFLEX MICROSCOPIC
Nitrite: NEGATIVE
Specific Gravity, Urine: 1.014 (ref 1.005–1.030)
Urobilinogen, UA: 0.2 mg/dL (ref 0.0–1.0)

## 2011-05-13 LAB — POCT I-STAT, CHEM 8
BUN: 10 mg/dL (ref 6–23)
Chloride: 105 mEq/L (ref 96–112)
Potassium: 3.6 mEq/L (ref 3.5–5.1)
Sodium: 141 mEq/L (ref 135–145)
TCO2: 24 mmol/L (ref 0–100)

## 2011-05-31 ENCOUNTER — Ambulatory Visit: Payer: Medicare HMO

## 2011-06-14 ENCOUNTER — Other Ambulatory Visit: Payer: Medicare HMO

## 2011-06-14 ENCOUNTER — Ambulatory Visit: Payer: Medicare HMO

## 2011-07-19 ENCOUNTER — Other Ambulatory Visit: Payer: Self-pay | Admitting: Internal Medicine

## 2011-07-20 NOTE — Telephone Encounter (Signed)
I don't think patient had his September appointment.  He is almost 2 months overdue for visit to coumadin clinic, he needs to be seen before filling script. Thanks!

## 2011-08-02 ENCOUNTER — Ambulatory Visit (INDEPENDENT_AMBULATORY_CARE_PROVIDER_SITE_OTHER): Payer: Medicare HMO | Admitting: Pharmacist

## 2011-08-02 DIAGNOSIS — Z7902 Long term (current) use of antithrombotics/antiplatelets: Secondary | ICD-10-CM

## 2011-08-02 DIAGNOSIS — Z7901 Long term (current) use of anticoagulants: Secondary | ICD-10-CM

## 2011-08-02 DIAGNOSIS — I2699 Other pulmonary embolism without acute cor pulmonale: Secondary | ICD-10-CM

## 2011-08-02 MED ORDER — WARFARIN SODIUM 5 MG PO TABS: ORAL_TABLET | ORAL | Status: DC

## 2011-08-02 NOTE — Progress Notes (Signed)
Anti-Coagulation Progress Note  Saint Hank is a 77 y.o. male who is currently on an anti-coagulation regimen.    RECENT RESULTS: Recent results are below, the most recent result is correlated with a dose of 42.5 mg. per week: Lab Results  Component Value Date   INR 2.10 08/02/2011   INR 3.85* 05/13/2011   INR 2.90 05/03/2011    ANTI-COAG DOSE:   Latest dosing instructions   Total Sun Mon Tue Wed Thu Fri Sat   45 7.5 mg 5 mg 7.5 mg 5 mg 7.5 mg 5 mg 7.5 mg    (5 mg1.5) (5 mg1) (5 mg1.5) (5 mg1) (5 mg1.5) (5 mg1) (5 mg1.5)         ANTICOAG SUMMARY: Anticoagulation Episode Summary              Current INR goal 2.0-3.0 Next INR check 08/30/2011   INR from last check 2.10 (08/02/2011)     Weekly max dose (mg)  Target end date Indefinite   Indications Pulmonary embolism, Encounter for long-term use of antiplatelets/antithrombotics   [facility] check location [facility]    Send INR reminders to [facility]   Comments History of multiple venous embolic episodes. Will continue to annual re-evaluate continued need for warfarin weighing risks vs. benefits.        Provider Role Specialty Phone number   Farley Ly, MD  Internal Medicine [phone]        ANTICOAG TODAY: Anticoagulation Summary as of 08/02/2011              INR goal 2.0-3.0     Selected INR 2.10 (08/02/2011) Next INR check 08/30/2011   Weekly max dose (mg)  Target end date Indefinite   Indications Pulmonary embolism, Encounter for long-term use of antiplatelets/antithrombotics    Anticoagulation Episode Summary              IN[facility] check location [facility]    Send INR reminders to [facility]   Comments History of multiple venous embolic episodes. Will continue to annual re-evaluate continued need for warfarin weighing risks vs. benefits.        Provider Role Specialty Phone number   Farley Ly, MD  Internal Medicine [phone]        PATIENT  INSTRUCTIONS: Patient Instructions  Patient instructed to take medications as defined in the Anti-coagulation Track section of this encounter.  Patient instructed to take today's dose.  Patient verbalized understanding of these instructions.        FOLLOW-UP Return in 4 weeks (on 08/30/2011) for Follow up INR.  [name], [name] Pharm.D., CACP

## 2011-08-02 NOTE — Patient Instructions (Signed)
Patient instructed to take medications as defined in the Anti-coagulation Track section of this encounter.  Patient instructed to take today's dose.  Patient verbalized understanding of these instructions.    

## 2011-08-11 ENCOUNTER — Emergency Department (HOSPITAL_COMMUNITY): Payer: Medicare HMO

## 2011-08-11 ENCOUNTER — Encounter (HOSPITAL_COMMUNITY): Payer: Self-pay | Admitting: Family Medicine

## 2011-08-11 ENCOUNTER — Other Ambulatory Visit: Payer: Self-pay

## 2011-08-11 ENCOUNTER — Emergency Department (HOSPITAL_COMMUNITY)
Admission: EM | Admit: 2011-08-11 | Discharge: 2011-08-12 | Disposition: A | Discharge status: Home / Self Care | Payer: Medicare HMO | Attending: Emergency Medicine | Admitting: Emergency Medicine

## 2011-08-11 DIAGNOSIS — Z86718 Personal history of other venous thrombosis and embolism: Secondary | ICD-10-CM | POA: Insufficient documentation

## 2011-08-11 DIAGNOSIS — I10 Essential (primary) hypertension: Secondary | ICD-10-CM | POA: Insufficient documentation

## 2011-08-11 DIAGNOSIS — Z8673 Personal history of transient ischemic attack (TIA), and cerebral infarction without residual deficits: Secondary | ICD-10-CM | POA: Insufficient documentation

## 2011-08-11 DIAGNOSIS — R4182 Altered mental status, unspecified: Secondary | ICD-10-CM | POA: Insufficient documentation

## 2011-08-11 DIAGNOSIS — Z7901 Long term (current) use of anticoagulants: Secondary | ICD-10-CM | POA: Insufficient documentation

## 2011-08-11 DIAGNOSIS — R5381 Other malaise: Secondary | ICD-10-CM | POA: Insufficient documentation

## 2011-08-11 LAB — DIFFERENTIAL
Basophils Absolute: 0 10*3/uL (ref 0.0–0.1)
Eosinophils Relative: 2 % (ref 0–5)
Lymphocytes Relative: 28 % (ref 12–46)
Lymphs Abs: 0.9 10*3/uL (ref 0.7–4.0)
Neutro Abs: 2.1 10*3/uL (ref 1.7–7.7)

## 2011-08-11 LAB — POCT I-STAT TROPONIN I

## 2011-08-11 LAB — URINALYSIS, ROUTINE W REFLEX MICROSCOPIC
Leukocytes, UA: NEGATIVE
Nitrite: NEGATIVE
Protein, ur: NEGATIVE mg/dL
Specific Gravity, Urine: 1.019 (ref 1.005–1.030)
Urobilinogen, UA: 0.2 mg/dL (ref 0.0–1.0)

## 2011-08-11 LAB — CBC
MCV: 91.5 fL (ref 78.0–100.0)
Platelets: 182 10*3/uL (ref 150–400)
RBC: 4 MIL/uL — ABNORMAL LOW (ref 4.22–5.81)
RDW: 14.7 % (ref 11.5–15.5)
WBC: 3.3 10*3/uL — ABNORMAL LOW (ref 4.0–10.5)

## 2011-08-11 LAB — BASIC METABOLIC PANEL
BUN: 14 mg/dL (ref 6–23)
Calcium: 9.6 mg/dL (ref 8.4–10.5)
Creatinine, Ser: 0.93 mg/dL (ref 0.50–1.35)
GFR calc non Af Amer: 78 mL/min — ABNORMAL LOW (ref >90)
Glucose, Bld: 99 mg/dL (ref 70–99)

## 2011-08-11 LAB — PROTIME-INR
INR: 1.34 (ref 0.00–1.49)
Prothrombin Time: 16.8 seconds — ABNORMAL HIGH (ref 11.6–15.2)

## 2011-08-11 NOTE — ED Notes (Signed)
Patient transported to X-ray 
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
## 2011-08-11 NOTE — ED Notes (Signed)
Patient transported to CT 

## 2011-08-12 LAB — RAPID URINE DRUG SCREEN, HOSP PERFORMED
Cocaine: NOT DETECTED
Opiates: NOT DETECTED
Tetrahydrocannabinol: NOT DETECTED

## 2011-08-12 NOTE — ED Notes (Signed)
MD at bedside. 
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
## 2011-08-17 ENCOUNTER — Ambulatory Visit (INDEPENDENT_AMBULATORY_CARE_PROVIDER_SITE_OTHER): Payer: Medicare HMO | Admitting: Internal Medicine

## 2011-08-17 ENCOUNTER — Encounter: Payer: Self-pay | Admitting: Internal Medicine

## 2011-08-17 VITALS — BP 139/88 | HR 71 | Temp 97.0°F | Ht 68.5 in | Wt 171.4 lb

## 2011-08-17 DIAGNOSIS — G3184 Mild cognitive impairment, so stated: Secondary | ICD-10-CM

## 2011-08-17 DIAGNOSIS — Z7902 Long term (current) use of antithrombotics/antiplatelets: Secondary | ICD-10-CM

## 2011-08-17 DIAGNOSIS — I10 Essential (primary) hypertension: Secondary | ICD-10-CM

## 2011-08-17 DIAGNOSIS — E785 Hyperlipidemia, unspecified: Secondary | ICD-10-CM

## 2011-08-17 DIAGNOSIS — F0280 Dementia in other diseases classified elsewhere without behavioral disturbance: Secondary | ICD-10-CM | POA: Insufficient documentation

## 2011-08-17 DIAGNOSIS — Z23 Encounter for immunization: Secondary | ICD-10-CM

## 2011-08-17 LAB — LIPID PANEL
Cholesterol: 238 mg/dL — ABNORMAL HIGH (ref 0–200)
HDL: 62 mg/dL (ref >39)
Total CHOL/HDL Ratio: 3.8 Ratio
Triglycerides: 93 mg/dL (ref <150)
VLDL: 19 mg/dL (ref 0–40)

## 2011-08-17 LAB — PROTIME-INR: INR: 1.51 — ABNORMAL HIGH (ref <1.50)

## 2011-08-17 MED ORDER — WARFARIN SODIUM 5 MG PO TABS: 5.0000 mg | ORAL_TABLET | Freq: Every day | ORAL | Status: DC

## 2011-08-17 MED ORDER — LISINOPRIL 40 MG PO TABS: 40.0000 mg | ORAL_TABLET | Freq: Every day | ORAL | Status: DC

## 2011-08-17 MED ORDER — SIMVASTATIN 40 MG PO TABS: 40.0000 mg | ORAL_TABLET | Freq: Every day | ORAL | Status: DC

## 2011-08-17 MED ORDER — AMLODIPINE BESYLATE 10 MG PO TABS: 10.0000 mg | ORAL_TABLET | Freq: Every day | ORAL | Status: DC

## 2011-08-17 MED ORDER — ALPRAZOLAM 0.5 MG PO TABS: 0.5000 mg | ORAL_TABLET | Freq: Two times a day (BID) | ORAL | Status: DC | PRN

## 2011-08-17 NOTE — Assessment & Plan Note (Signed)
Based on recent visit to the emergency department it appears that patient is suffering from dementia as he was found wandering and was confused. Patient however denies confusion and states that his state of confusion was secondary to panic. I performed a Mini-Mental Status Examination on patient and he actually scored quite well. His score was 25/30. He missed 3 on recall and one on sentence. He performed the clock from function without any difficulty. This indicates that patient's  Executive function is preserved. In terms of activities of daily living patient can perform independently. In terms of IADLs, patient still drives on his own, prepares most meals on his own, administers medications on his own and takes care of his wife who is Alzheimer's dementia. Of note patient does have assistance with his finances as his son and daughter will help write cheque.  Based on his activity level as well as some Mini-Mental Status this patient except for 5 days mild cognitive impairment rather than dementia. Patient was encouraged to followup more frequently with in the clinic in intervals of every 6 months. A Mini-Mental status examination should be done yearly or if his mental status changes significantly. No medications such as Aricept or Namenda will be prescribed at this time. Patient also has a nursing aide that helps with taking care of his wife. Patient is still involved in church activities and was encouraged to continue with these activities.

## 2011-08-17 NOTE — Patient Instructions (Signed)
Please go to the pharmacy and pick up your refills. Please take all medications as directed.  Please use Eucerin cream for dry skin.  Please follow up on Monday to see Dr. Alexandria Lodge. Please follow up with the clinic in 3-6 months.

## 2011-08-17 NOTE — Assessment & Plan Note (Signed)
Lab Results  Component Value Date   NA 137 08/11/2011   K 3.5 08/11/2011   CL 103 08/11/2011   CO2 23 08/11/2011   BUN 14 08/11/2011   CREATININE 0.93 08/11/2011    BP Readings from Last 3 Encounters:  08/17/11 139/88  08/11/11 134/89  02/24/11 136/90    Assessment: Hypertension control:  controlled  Progress toward goals:  at goal Barriers to meeting goals:  no barriers identified  Plan: Hypertension treatment:  continue current medications

## 2011-08-17 NOTE — Assessment & Plan Note (Addendum)
Lab Results  Component Value Date   INR 1.34 08/11/2011   INR 2.10 08/02/2011   INR 3.85* 05/13/2011   Spoke with Dr. Alexandria Lodge, who feels patient is not compliant with his Coumadin therapy which is very likely. Dr. Alexandria Lodge spoke with patient and changed his Coumadin therapy slightly. Patient will followup with Dr. Alexandria Lodge on 08/22/2011.

## 2011-08-17 NOTE — Assessment & Plan Note (Signed)
Lab Results  Component Value Date   CHOL  Value: 163        ATP [name] CLASSIFICATION:  <200     mg/dL   Desirable  409-811  mg/dL   Borderline High  >=914    mg/dL   High        7/82/9562   HDL 58 01/17/2010   LDLCALC  Value: 90        Total Cholesterol/HDL:CHD Risk Coronary Heart Disease Risk Table                     Men   Women  1/2 Average Risk   3.4   3.3  Average Risk       5.0   4.4  2 X Average Risk   9.6   7.1  3 X Average Risk  23.4   11.0        Use the calculated Patient Ratio above and the CHD Risk Table to determine the patient's CHD Risk.        ATP [name] CLASSIFICATION (LDL):  <100     mg/dL   Optimal  130-865  mg/dL   Near or Above                    Optimal  130-159  mg/dL   Borderline  784-696  mg/dL   High  >295     mg/dL   Very High 2/84/1324   TRIG 75 01/17/2010   CHOLHDL 2.8 01/17/2010   Lipids are stable. We'll check a lipid panel today. Patient is supposed to be on simvastatin however I am not sure if he is compliant with medication. We'll provide patient with refill however since his lipids are at goal will focus attention more on compliance with Coumadin therapy.

## 2011-08-17 NOTE — Progress Notes (Signed)
  Subjective:    Patient ID: [name] [name], male    DOB: [birth year], 77 y.o.   MRN: [id number]  HPI Mr. Hillery is a 77 year old man with pmh significant for recurrent PE, HLD, HTN, Prostate Cancer s/p prostatectomy, and GERD who presents today for a general check up.   Patient was seen in the emergency department on 08/11/2011 for altered mental status. Patient reports that he was in a state of panic and he was found wandering and thus he was brought in by his family. Patient presents to the clinic by himself and reports that he was with very little sleep as he is working on [employer] as well as taking care of his wife has Alzheimer's disease. He attributes his mental status changes to sleep deprivation. Per family patient is occasionally confused and may even have a component of dementia. Patient has not been formally evaluated for dementia in the past. He states that since then he has been doing better however still complains of being overworked because of his responsibilities at the church.   Review of Systems  All other systems reviewed and are negative.       Objective:   Physical Exam  Constitutional: He is oriented to person, place, and time. He appears well-developed.  HENT:  Head: Normocephalic.  Eyes: Pupils are equal, round, and reactive to light.  Neck: Normal range of motion. Neck supple.  Cardiovascular: Normal rate and regular rhythm.   Pulmonary/Chest: Effort normal and breath sounds normal.  Abdominal: Soft. Bowel sounds are normal.  Musculoskeletal: Normal range of motion.  Neurological: He is alert and oriented to person, place, and time.       MMSE 25/30 missing 3 on recall and 1 on writing sentence Normal clock draw  Psychiatric: He has a normal mood and affect.          Assessment & Plan:

## 2011-08-24 NOTE — Progress Notes (Signed)
Addended by: Remus Blake on: 08/24/2011 02:22 PM   Modules accepted: Orders

## 2011-08-30 ENCOUNTER — Ambulatory Visit (INDEPENDENT_AMBULATORY_CARE_PROVIDER_SITE_OTHER): Payer: Medicare HMO | Admitting: Pharmacist

## 2011-08-30 DIAGNOSIS — Z7902 Long term (current) use of antithrombotics/antiplatelets: Secondary | ICD-10-CM

## 2011-08-30 DIAGNOSIS — I2699 Other pulmonary embolism without acute cor pulmonale: Secondary | ICD-10-CM

## 2011-08-30 DIAGNOSIS — Z7901 Long term (current) use of anticoagulants: Secondary | ICD-10-CM

## 2011-08-30 NOTE — Patient Instructions (Signed)
Patient instructed to take medications as defined in the Anti-coagulation Track section of this encounter.  Patient instructed to take today's dose.  Patient verbalized understanding of these instructions.    

## 2011-08-30 NOTE — Progress Notes (Signed)
Anti-Coagulation Progress Note  Tadarrius Burch is a 77 y.o. male who is currently on an anti-coagulation regimen.    RECENT RESULTS: Recent results are below, the most recent result is correlated with a dose of 50 mg. per week: Lab Results  Component Value Date   INR 2.00 08/30/2011   INR 1.51* 08/17/2011   INR 1.34 08/11/2011    ANTI-COAG DOSE:   Latest dosing instructions   Total Sun Mon Tue Wed Thu Fri Sat   52.5 7.5 mg 7.5 mg 7.5 mg 7.5 mg 7.5 mg 7.5 mg 7.5 mg    (5 mg1.5) (5 mg1.5) (5 mg1.5) (5 mg1.5) (5 mg1.5) (5 mg1.5) (5 mg1.5)         ANTICOAG SUMMARY: Anticoagulation Episode Summary              Current INR goal 2.0-3.0 Next INR check 09/27/2011   INR from last check 2.00 (08/30/2011)     Weekly max dose (mg)  Target end date Indefinite   Indications Pulmonary embolism, Encounter for long-term use of antiplatelets/antithrombotics   [facility]erred [facility]    Send INR reminders to [facility]   Comments History of multiple venous embolic episodes. Will continue to annual re-evaluate continued need for warfarin weighing risks vs. benefits.        Provider Role Specialty Phone number   Farley Ly, MD  Internal Medicine [phone]        ANTICOAG TODAY: Anticoagulation Summary as of 08/30/2011              INR goal 2.0-3.0     Selected INR 2.00 (08/30/2011) Next INR check 09/27/2011   Weekly max dose (mg)  Target end date Indefinite   Indications Pulmonary embolism, Encounter for long-term use of antiplatelets/antithrombotics    Anticoagulation Episode Summary              [facility] check [facility]    Send INR reminders to [facility]   Comments History of multiple venous embolic episodes. Will continue to annual re-evaluate continued need for warfarin weighing risks vs. benefits.        Provider Role Specialty Phone number   Farley Ly, MD  Internal Medicine [phone]         PATIENT INSTRUCTIONS: Patient Instructions  Patient instructed to take medications as defined in the Anti-coagulation Track section of this encounter.  Patient instructed to take today's dose.  Patient verbalized understanding of these instructions.        FOLLOW-UP Return in 4 weeks (on 09/27/2011) for Follow up INR.  [name], [name] Pharm.D., CACP

## 2011-09-27 ENCOUNTER — Ambulatory Visit: Payer: Medicare HMO

## 2011-11-03 ENCOUNTER — Emergency Department (HOSPITAL_COMMUNITY): Payer: Medicare Other

## 2011-11-03 ENCOUNTER — Other Ambulatory Visit: Payer: Self-pay

## 2011-11-03 ENCOUNTER — Observation Stay (HOSPITAL_COMMUNITY)
Admission: EM | Admit: 2011-11-03 | Discharge: 2011-11-05 | Disposition: A | Discharge status: Home / Self Care | Payer: Medicare Other | Source: Ambulatory Visit | Attending: Internal Medicine | Admitting: Internal Medicine

## 2011-11-03 ENCOUNTER — Encounter (HOSPITAL_COMMUNITY): Payer: Self-pay

## 2011-11-03 DIAGNOSIS — I10 Essential (primary) hypertension: Secondary | ICD-10-CM | POA: Insufficient documentation

## 2011-11-03 DIAGNOSIS — R079 Chest pain, unspecified: Secondary | ICD-10-CM

## 2011-11-03 DIAGNOSIS — N179 Acute kidney failure, unspecified: Secondary | ICD-10-CM | POA: Insufficient documentation

## 2011-11-03 DIAGNOSIS — I2699 Other pulmonary embolism without acute cor pulmonale: Secondary | ICD-10-CM

## 2011-11-03 DIAGNOSIS — R791 Abnormal coagulation profile: Secondary | ICD-10-CM | POA: Insufficient documentation

## 2011-11-03 DIAGNOSIS — Z8673 Personal history of transient ischemic attack (TIA), and cerebral infarction without residual deficits: Secondary | ICD-10-CM | POA: Insufficient documentation

## 2011-11-03 DIAGNOSIS — Z86711 Personal history of pulmonary embolism: Secondary | ICD-10-CM | POA: Insufficient documentation

## 2011-11-03 DIAGNOSIS — Z8546 Personal history of malignant neoplasm of prostate: Secondary | ICD-10-CM | POA: Insufficient documentation

## 2011-11-03 DIAGNOSIS — R0789 Other chest pain: Principal | ICD-10-CM | POA: Insufficient documentation

## 2011-11-03 DIAGNOSIS — Z7901 Long term (current) use of anticoagulants: Secondary | ICD-10-CM | POA: Insufficient documentation

## 2011-11-03 DIAGNOSIS — E785 Hyperlipidemia, unspecified: Secondary | ICD-10-CM | POA: Insufficient documentation

## 2011-11-03 DIAGNOSIS — Z7902 Long term (current) use of antithrombotics/antiplatelets: Secondary | ICD-10-CM | POA: Diagnosis present

## 2011-11-03 HISTORY — DX: Malignant neoplasm of prostate: C61

## 2011-11-03 LAB — BASIC METABOLIC PANEL
BUN: 14 mg/dL (ref 6–23)
CO2: 28 mEq/L (ref 19–32)
Calcium: 9.5 mg/dL (ref 8.4–10.5)
Glucose, Bld: 102 mg/dL — ABNORMAL HIGH (ref 70–99)
Sodium: 141 mEq/L (ref 135–145)

## 2011-11-03 LAB — TROPONIN I: Troponin I: <0.3 ng/mL (ref <0.30)

## 2011-11-03 LAB — CBC
HCT: 36.7 % — ABNORMAL LOW (ref 39.0–52.0)
Hemoglobin: 12.1 g/dL — ABNORMAL LOW (ref 13.0–17.0)
MCH: 29.7 pg (ref 26.0–34.0)
MCHC: 33 g/dL (ref 30.0–36.0)
RBC: 4.08 MIL/uL — ABNORMAL LOW (ref 4.22–5.81)

## 2011-11-03 LAB — PROTIME-INR: INR: 8.36 (ref 0.00–1.49)

## 2011-11-03 NOTE — ED Provider Notes (Addendum)
Complains of intermittent chest pain for several days worse in the mornings presently asymptomatic on exam lungs clear auscultation heart regular rate and rhythm abdomen nondistended nontender. Spoke with internal medicine resident physician who will arrange for admission . Diagnosis #1 chest pain 2 Coumadin toxicity  Doug Sou, MD 11/03/11 1956  Doug Sou, MD 11/03/11 2117

## 2011-11-03 NOTE — ED Provider Notes (Signed)
History     CSN: [id number]  Arrival date & time 11/03/11  1608   First MD Initiated Contact with Patient 11/03/11 1854      Chief Complaint  Patient presents with  . Chest Pain    (Consider location/radiation/quality/duration/timing/severity/associated sxs/prior treatment) HPI Patient is 78 yo [place] American male with a history of PE, CVA, and prostate cancer presents to the ED complaining of an episode of chest pain that awoke him from his nap this afternoon. He reports that he woke and had chest pain across his whole chest and under his arms. He states that he has had pain similar to this in the past, but not this severe. Patient lives in [place], [place] and was brought to Urgent Care by his sister. He reports that once he got to [facility] the pain had subsided.  He denies fever, chills, sweats, SOB, N/V/D, abdominal pain, extremity pain or swelling,  Patient denies any allergies. Past Medical History  Diagnosis Date  . PE (pulmonary embolism)     unprovoked PE completed 6 months of warfarin, warfarin d/ced 02/12/2010, repeat PE 07/10/2010 after a long car ride and now on coumadin.  [name] Hypertension   . CVA (cerebral vascular accident)     MRI 12/10 negative except small vessel changes, CT 12/2009 old R thalamic infarct  . Pituitary microadenoma     incidental finding CT 12/2009  . Cancer   . Prostate cancer   . PE (pulmonary embolism)     Past Surgical History  Procedure Date  . Prostatectomy     No family history on file.  History  Substance Use Topics  . Smoking status: Never Smoker   . Smokeless tobacco: Not on file  . Alcohol Use: No      Review of Systems All pertinent positives and negatives reviewed in the history of present illness  Allergies  Other  Home Medications   Current Outpatient Rx  Name Route Sig Dispense Refill  . ALPRAZOLAM 0.5 MG PO TABS Oral Take 0.5 mg by mouth 2 (two) times daily as needed. For anxiety    . AMITRIPTYLINE HCL 100 MG PO TABS  Oral Take 100 mg by mouth at bedtime.     [name] AMLODIPINE BESYLATE 10 MG PO TABS Oral Take 10 mg by mouth daily.    . ASPIRIN 81 MG PO TABS Oral Take 81 mg by mouth daily.      [name] ZICAM COLD REMEDY PO Oral Take 2 tablets by mouth daily.    [name] HYDROCHLOROTHIAZIDE 12.5 MG PO CAPS Oral Take 12.5 mg by mouth daily.    . IBUPROFEN 200 MG PO TABS Oral Take 800 mg by mouth every 8 (eight) hours as needed. For pain.    [name] LISINOPRIL 40 MG PO TABS Oral Take 40 mg by mouth daily.    [name] OMEPRAZOLE 20 MG PO CPDR Oral Take 20 mg by mouth daily.      [name] SIMVASTATIN 40 MG PO TABS Oral Take 40 mg by mouth at bedtime.    . WARFARIN SODIUM 5 MG PO TABS Oral Take 5 mg by mouth daily. Take 5 mg on Monday, and 10 mg remaining days      BP 137/85  Pulse 70  Temp(Src) 98.1 F (36.7 C) (Oral)  Resp 18  Ht 5\' 8"  (1.727 m)  Wt 168 lb (76.204 kg)  BMI 25.54 kg/m2  SpO2 98%  Physical Exam  Constitutional: He is oriented to person, place, and time. He appears well-developed  and well-nourished. No distress.  HENT:  Head: Normocephalic and atraumatic.  Eyes: Conjunctivae are normal. Pupils are equal, round, and reactive to light.  Neck: Normal range of motion. Neck supple.  Cardiovascular: Normal rate, regular rhythm and normal heart sounds.   Pulmonary/Chest: Effort normal and breath sounds normal. No respiratory distress. He has no wheezes. He has no rales. He exhibits no tenderness.  Abdominal: Soft. Bowel sounds are normal. He exhibits no distension and no mass. There is no tenderness. There is no rebound and no guarding.  Musculoskeletal:       Right lower leg: Normal. He exhibits no tenderness, no swelling and no edema.       Left lower leg: Normal. He exhibits no tenderness, no swelling and no edema.  Lymphadenopathy:    He has no cervical adenopathy.  Neurological: He is alert and oriented to person, place, and time.  Skin: Skin is warm and dry. He is not diaphoretic.    ED Course  Procedures (including  critical care time)  Labs Reviewed  CBC - Abnormal; Notable for the following:    WBC 2.8 (*)    RBC 4.08 (*)    Hemoglobin 12.1 (*)    HCT 36.7 (*)    All other components within normal limits  BASIC METABOLIC PANEL - Abnormal; Notable for the following:    Glucose, Bld 102 (*)    GFR calc non Af Amer 77 (*)    GFR calc Af Amer 89 (*)    All other components within normal limits  PROTIME-INR - Abnormal; Notable for the following:    Prothrombin Time 70.4 (*)    INR 8.36 (*)    All other components within normal limits  TROPONIN I   Dg Chest 2 View  11/03/2011  *RADIOLOGY REPORT*  Clinical Data: Chest pressure.  CHEST - 2 VIEW  Comparison: 08/11/2011  Findings: Left base scarring or atelectasis again noted, unchanged. Heart is normal size.  Right lung is clear.  No effusions.  No acute bony abnormality.  IMPRESSION: Stable left base scarring or atelectasis.  Original Report Authenticated By: Cyndie Chime, M.D.      Patient be admitted to the hospital for Coumadin toxicity as well as his chest pain for further evaluation.  The outpatient clinic to be down to see the patient for further evaluation and admission.  Patient has been stable and resting comfortably here in the emergency department.   MDM   Date: 11/03/2011  Rate:65  Rhythm: normal sinus rhythm  QRS Axis: normal  Intervals: normal  ST/T Wave abnormalities: normal  Conduction Disutrbances:right bundle branch block  Narrative Interpretation:   Old EKG Reviewed: unchanged          Carlyle Dolly, PA-C 11/03/11 2123

## 2011-11-03 NOTE — ED Notes (Signed)
Pt decided to stay after speaking with Dr. Buck Mam.  Pt's son states that pt has advanced dementia and sundowns very badly.  His son requested sleep and anxiety medication for his father.

## 2011-11-03 NOTE — ED Notes (Addendum)
Pt requested to speak to EDP [facility] about the need to admission.  Pt would like to sign himself out AMA.  After their conversation the pt has decided to sign himself out AMA.

## 2011-11-03 NOTE — ED Notes (Signed)
Pt's son states that pt is on coumadin but is not monitored or controlled.

## 2011-11-03 NOTE — ED Notes (Signed)
Pt presents to the ER with c/o chest pain on and off x 1 week. Pt claimed that he woke up today his chest pain was worse. Pt went to his PMD and was sent to the ER for further evaluation

## 2011-11-04 ENCOUNTER — Encounter (HOSPITAL_COMMUNITY): Payer: Self-pay | Admitting: Internal Medicine

## 2011-11-04 ENCOUNTER — Other Ambulatory Visit: Payer: Self-pay

## 2011-11-04 DIAGNOSIS — Z7902 Long term (current) use of antithrombotics/antiplatelets: Secondary | ICD-10-CM

## 2011-11-04 DIAGNOSIS — I2699 Other pulmonary embolism without acute cor pulmonale: Secondary | ICD-10-CM

## 2011-11-04 DIAGNOSIS — R079 Chest pain, unspecified: Secondary | ICD-10-CM

## 2011-11-04 LAB — BASIC METABOLIC PANEL
GFR calc Af Amer: 89 mL/min — ABNORMAL LOW (ref >90)
GFR calc non Af Amer: 77 mL/min — ABNORMAL LOW (ref >90)
Glucose, Bld: 96 mg/dL (ref 70–99)
Potassium: 4.2 mEq/L (ref 3.5–5.1)
Sodium: 140 mEq/L (ref 135–145)

## 2011-11-04 LAB — DIFFERENTIAL
Basophils Absolute: 0 10*3/uL (ref 0.0–0.1)
Basophils Relative: 1 % (ref 0–1)
Eosinophils Absolute: 0.3 10*3/uL (ref 0.0–0.7)
Eosinophils Relative: 11 % — ABNORMAL HIGH (ref 0–5)
Lymphs Abs: 1.3 10*3/uL (ref 0.7–4.0)
Neutrophils Relative %: 35 % — ABNORMAL LOW (ref 43–77)

## 2011-11-04 LAB — CARDIAC PANEL(CRET KIN+CKTOT+MB+TROPI)
CK, MB: 3.6 ng/mL (ref 0.3–4.0)
Relative Index: 0.9 (ref 0.0–2.5)
Relative Index: 0.9 (ref 0.0–2.5)
Total CK: 383 U/L — ABNORMAL HIGH (ref 7–232)
Total CK: 453 U/L — ABNORMAL HIGH (ref 7–232)
Total CK: 378 U/L — ABNORMAL HIGH (ref 7–232)
Troponin I: <0.3 ng/mL (ref <0.30)

## 2011-11-04 LAB — CBC
Hemoglobin: 13.6 g/dL (ref 13.0–17.0)
MCHC: 33.4 g/dL (ref 30.0–36.0)
Platelets: 181 10*3/uL (ref 150–400)

## 2011-11-04 LAB — PROTIME-INR
INR: 7.59 (ref 0.00–1.49)
Prothrombin Time: 65.3 seconds — ABNORMAL HIGH (ref 11.6–15.2)

## 2011-11-04 LAB — POCT I-STAT TROPONIN I: Troponin i, poc: 0 ng/mL (ref 0.00–0.08)

## 2011-11-04 LAB — LIPID PANEL
LDL Cholesterol: 151 mg/dL — ABNORMAL HIGH (ref 0–99)
Total CHOL/HDL Ratio: 3.7 RATIO
Triglycerides: 146 mg/dL (ref <150)
VLDL: 29 mg/dL (ref 0–40)

## 2011-11-04 MED ORDER — NITROGLYCERIN 0.4 MG SL SUBL: 0.4000 mg | SUBLINGUAL_TABLET | SUBLINGUAL | Status: DC | PRN

## 2011-11-04 MED ORDER — PHYTONADIONE 5 MG PO TABS
2.5000 mg | ORAL_TABLET | Freq: Once | ORAL | Status: AC
  Administered 2011-11-04: 2.5 mg via ORAL
  Filled 2011-11-04: qty 1

## 2011-11-04 MED ORDER — PANTOPRAZOLE SODIUM 40 MG PO TBEC
40.0000 mg | DELAYED_RELEASE_TABLET | Freq: Every day | ORAL | Status: DC
  Administered 2011-11-04 – 2011-11-05 (×2): 40 mg via ORAL
  Filled 2011-11-04 (×2): qty 1

## 2011-11-04 MED ORDER — ZOLPIDEM TARTRATE 5 MG PO TABS: 5.0000 mg | ORAL_TABLET | Freq: Every evening | ORAL | Status: DC | PRN

## 2011-11-04 MED ORDER — ACETAMINOPHEN 325 MG PO TABS: 650.0000 mg | ORAL_TABLET | ORAL | Status: DC | PRN

## 2011-11-04 MED ORDER — AMITRIPTYLINE HCL 100 MG PO TABS
100.0000 mg | ORAL_TABLET | Freq: Every day | ORAL | Status: DC
  Administered 2011-11-04 (×2): 100 mg via ORAL
  Filled 2011-11-04 (×3): qty 1

## 2011-11-04 MED ORDER — ALUM & MAG HYDROXIDE-SIMETH 200-200-20 MG/5ML PO SUSP
30.0000 mL | Freq: Once | ORAL | Status: AC
  Administered 2011-11-04: 30 mL via ORAL
  Filled 2011-11-04: qty 30

## 2011-11-04 MED ORDER — LISINOPRIL 40 MG PO TABS
40.0000 mg | ORAL_TABLET | Freq: Every day | ORAL | Status: DC
  Administered 2011-11-04: 40 mg via ORAL
  Filled 2011-11-04 (×2): qty 1

## 2011-11-04 MED ORDER — SIMVASTATIN 40 MG PO TABS
40.0000 mg | ORAL_TABLET | Freq: Every day | ORAL | Status: DC
  Administered 2011-11-04: 40 mg via ORAL
  Filled 2011-11-04 (×2): qty 1

## 2011-11-04 MED ORDER — HYDROCHLOROTHIAZIDE 12.5 MG PO CAPS
12.5000 mg | ORAL_CAPSULE | Freq: Every day | ORAL | Status: DC
  Administered 2011-11-04: 12.5 mg via ORAL
  Filled 2011-11-04 (×2): qty 1

## 2011-11-04 MED ORDER — VITAMIN K1 10 MG/ML IJ SOLN
1.2500 mg | Freq: Once | INTRAVENOUS | Status: AC
  Administered 2011-11-04: 1.3 mg via INTRAVENOUS
  Filled 2011-11-04: qty 0.13

## 2011-11-04 MED ORDER — AMLODIPINE BESYLATE 10 MG PO TABS
10.0000 mg | ORAL_TABLET | Freq: Every day | ORAL | Status: DC
  Administered 2011-11-04: 10 mg via ORAL
  Filled 2011-11-04 (×2): qty 1

## 2011-11-04 MED ORDER — ASPIRIN EC 81 MG PO TBEC
81.0000 mg | DELAYED_RELEASE_TABLET | Freq: Every day | ORAL | Status: DC
  Administered 2011-11-04 – 2011-11-05 (×2): 81 mg via ORAL
  Filled 2011-11-04 (×2): qty 1

## 2011-11-04 MED ORDER — ONDANSETRON HCL 4 MG/2ML IJ SOLN: 4.0000 mg | Freq: Four times a day (QID) | INTRAMUSCULAR | Status: DC | PRN

## 2011-11-04 MED ORDER — ALPRAZOLAM 0.5 MG PO TABS: 0.5000 mg | ORAL_TABLET | Freq: Two times a day (BID) | ORAL | Status: DC | PRN

## 2011-11-04 MED ORDER — ASPIRIN EC 81 MG PO TBEC: 81.0000 mg | DELAYED_RELEASE_TABLET | Freq: Every day | ORAL | Status: DC

## 2011-11-04 NOTE — Progress Notes (Signed)
Subjective: No acute events overnight.  Patient notes no chest pain this morning.  Patient drowsy this morning, but arousable.  Objective: Vital signs in last 24 hours: Filed Vitals:   11/04/11 0057 11/04/11 0112 11/04/11 0500 11/04/11 1000  BP:   113/72 137/89  Pulse:  57 72   Temp:  97.7 F (36.5 C) 98.8 F (37.1 C)   TempSrc:  Oral    Resp:  20 20   Height: 5\' 8"  (1.727 m) 5\' 8"  (1.727 m)    Weight: 168 lb (76.204 kg)     SpO2:  98% 93%    Weight change:  No intake or output data in the 24 hours ending 11/04/11 1032  Physical Exam: General: drowsy but arousable, NAD HEENT: pupils equal round and reactive to light, vision grossly intact, oropharynx clear and non-erythematous  Neck: supple, no lymphadenopathy Lungs: clear to ascultation bilaterally, normal work of respiration, no wheezes, rales, ronchi Heart: regular rate and rhythm, no murmurs, gallops, or rubs Abdomen: soft, non-tender, non-distended, normal bowel sounds Extremities: no cyanosis, clubbing, or edema Neurologic: alert & oriented X3, cranial nerves II-XII intact, strength grossly intact, sensation intact to light touch  Lab Results: Basic Metabolic Panel:  Lab 11/03/11 1610  NA 141  K 3.7  CL 105  CO2 28  GLUCOSE 102*  BUN 14  CREATININE 0.98  CALCIUM 9.5  MG --  PHOS --   CBC:  Lab 11/04/11 0815 11/03/11 1639  WBC 3.0* 2.8*  NEUTROABS 1.1* --  HGB 13.6 12.1*  HCT 40.7 36.7*  MCV 89.6 90.0  PLT 181 209   Cardiac Enzymes:  Lab 11/04/11 0115 11/03/11 1903  CKTOTAL 383* --  CKMB 3.5 --  CKMBINDEX -- --  TROPONINI <0.30 <0.30   Fasting Lipid Panel:  Lab 11/04/11 0115  CHOL 247*  HDL 67  LDLCALC 151*  TRIG 146  CHOLHDL 3.7  LDLDIRECT --   Coagulation:  Lab 11/04/11 0815 11/03/11 1639  LABPROT 65.3* 70.4*  INR 7.59* 8.36*    Medications: I have reviewed the patient's current medications. Scheduled Meds:   . alum & mag hydroxide-simeth  30 mL Oral Once  . amitriptyline   100 mg Oral QHS  . amLODipine  10 mg Oral Daily  . aspirin EC  81 mg Oral Daily  . hydrochlorothiazide  12.5 mg Oral Daily  . lisinopril  40 mg Oral Daily  . pantoprazole  40 mg Oral Q1200  . phytonadione (VITAMIN K) IV  1.3 mg Intravenous Once  . phytonadione  2.5 mg Oral Once  . simvastatin  40 mg Oral QHS  . DISCONTD: aspirin EC  81 mg Oral Daily   Continuous Infusions:  PRN Meds:.acetaminophen, ALPRAZolam, nitroGLYCERIN, ondansetron (ZOFRAN) IV, zolpidem  Assessment/Plan: The patient is a 78 yo man, history of 2 prior PE's on long-term anticoagulation, HTN, prior CVA, presenting with chest pain and supratherapeutic INR.  # Chest pain - subsided this am, CE's neg x2, EKG with old RBBB, cath 03/2007 shows 25% stenosis of LM and prox LAD, 30-50% stenosis at 1st diagonal, 25% stenosis at RM, 30% stenosis at PDA.  Echo 01/16/10 shows EF 55% with no wall motion abnormalities.  Chest pain more consistent with GERD vs msk. -continue to cycle CE's -aspirin, statin, nitro -continue telemetry  # Supratherapeutic INR - 2 prior PE's in 2010 and 2011, on life-long coumadin, last coumadin clinic appt 08/30/11, then patient lost to follow-up.  Last coumadin clinic visit note says take 7.5 mg daily, but  med rec on admission reveals instructions to take 10 mg daily except 5 mg Monday.  No evidence of acute bleeding.  Lab 11/04/11 0815 11/03/11 1639  INR 7.59* 8.36*  -received vit K 2.5 mg PO in ED -ordered 1.3 mg vit K IV now -follow INR's  # HTN - chronic, stable -amlodipine, HCTZ, lisinopril  # HLD - chronic, stable -statin  # Prophy - INR supratherapeutic, not DVT prophy needed    LOS: 1 day   Janalyn Harder 11/04/2011, 10:32 AM

## 2011-11-04 NOTE — ED Notes (Signed)
Report called to [facility] on 3700, pt transported to 3700

## 2011-11-04 NOTE — Progress Notes (Signed)
Pt. Complained of abdominal pain, abdomen soft slightly tender lower mid abdomen, + bowel sound, Dr. Dede Query made aware with orders made.

## 2011-11-04 NOTE — ED Provider Notes (Signed)
Medical screening examination/treatment/procedure(s) were conducted as a shared visit with non-physician practitioner(s) and myself.  I personally evaluated the patient during the encounter  Alexandro Line, MD 11/04/11 0052 

## 2011-11-04 NOTE — Progress Notes (Signed)
CRITICAL VALUE ALERT  Critical value received:  INR 7.59   Date of notification:  11/04/11  Time of notification:  09:15  Critical value read back:yes  Nurse who received alert:  Nolon Nations   MD notified (1st page):  Dr. Manson Passey  Time of first page:  09:32  MD notified (2nd page):  Time of second page:  Responding MD:  Dr. Manson Passey   Time MD responded:  09:33

## 2011-11-04 NOTE — H&P (Signed)
Hospital Admission Note Date: 11/04/2011  Patient name: [name] [name] Medical record number: [id number] Date of birth: 1930/10/07 Age: 78 y.o. Gender: male PCP: Vernice Jefferson, MD, MD  Medical Service: [facility] internal medicine teaching service  Attending physician: Dr. Judyann Munson    1st Contact:  Dr. Janalyn Harder   Pager: [phone] 2nd Contact:  Dr. Lars Mage               Pager: [phone]  After 5 pm or weekends: 1st Contact:      Pager: [phone] 2nd Contact:      Pager: [phone]  Chief Complaint: Chest pain  History of Present Illness: This is a 78 year old male with PMH of PE x2 on chronic Coumadin, HTN, and old CVA who presents with chest pain. Patient states that he has chest pain located at midsternal and epigastric area on and off x 1 week.  He complained of an episode of chest pain that awoke him from his nap this afternoon. His chest pain is aching, 6/10, no radiation and lasts for 2 hours before it resolved on its own.  Patient states that he has had chronic epigastric pain which usually relieved by Prilosec.  And his chest pain is very similar to his chronic epigastric pain.  Patient went to urgent care where his pain had subsided.   He was subsequently transferred to [facility] ED for further evaluation, and he was found to have INR of 8.36.   No headache, fever, or sore throat. No shortness of breath or dyspnea on exertion. No chest pressure or palpitation.  No nausea, vomiting, or abdominal pain. No melena, diarrhea or incontinence. No muscle weakness.                    Medication List  As of 11/04/2011  6:48 AM   ASK your doctor about these medications         ALPRAZolam 0.5 MG tablet   Commonly known as: XANAX   Take 0.5 mg by mouth 2 (two) times daily as needed. For anxiety      amitriptyline 100 MG tablet   Commonly known as: ELAVIL   Take 100 mg by mouth at bedtime.      amLODipine 10 MG tablet   Commonly known as: NORVASC   Take 10 mg by mouth daily.     aspirin 81 MG tablet   Take 81 mg by mouth daily.      hydrochlorothiazide 12.5 MG capsule   Commonly known as: MICROZIDE   Take 12.5 mg by mouth daily.      ibuprofen 200 MG tablet   Commonly known as: ADVIL,MOTRIN   Take 800 mg by mouth every 8 (eight) hours as needed. For pain.      lisinopril 40 MG tablet   Commonly known as: PRINIVIL,ZESTRIL   Take 40 mg by mouth daily.      omeprazole 20 MG capsule   Commonly known as: PRILOSEC   Take 20 mg by mouth daily.      simvastatin 40 MG tablet   Commonly known as: ZOCOR   Take 40 mg by mouth at bedtime.      warfarin 5 MG tablet   Commonly known as: COUMADIN   Take 5 mg by mouth daily. Take 5 mg on Monday, and 10 mg remaining days      ZICAM COLD REMEDY PO   Take 2 tablets by mouth daily.  Allergies: Other Past Medical History  Diagnosis Date  . PE (pulmonary embolism)     unprovoked PE completed 6 months of warfarin, warfarin d/ced 02/12/2010, repeat PE 07/10/2010 after a long car ride and now on coumadin.  [name] Hypertension   . CVA (cerebral vascular accident)     MRI 12/10 negative except small vessel changes, CT 12/2009 old R thalamic infarct  . Pituitary microadenoma     incidental finding CT 12/2009  . Cancer   . Prostate cancer   . PE (pulmonary embolism)    Past Surgical History  Procedure Date  . Prostatectomy    Family History  Problem Relation Age of Onset  . Hypertension Father     Passed away from cerebral hemorrhage at age of 64.  [name] Dementia Mother     Passed away  . Hypertension Child     4 adult children   History   Social History  . Marital Status: Married    Spouse Name: N/A    Number of Children: N/A  . Years of Education: N/A   Occupational History  .      retired   Social History Main Topics  . Smoking status: Never Smoker   . Smokeless tobacco: Not on file  . Alcohol Use: No  . Drug Use: No  . Sexually Active: No   Other Topics Concern  . Not on file   Social  History Narrative   Lives with his wife who has dementia at their home.  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 rate 20, height 5\' 8"  (1.727 m), weight 168 lb (76.204 kg), SpO2 93.00%. No signs of active bleeding. General: alert, well-developed, and cooperative to examination.  Head: normocephalic and atraumatic.  Eyes: vision grossly intact, pupils equal, pupils round, pupils reactive to light, no injection and anicteric.  Mouth: pharynx pink and moist, no erythema, and no exudates.  Neck: supple, full ROM, no thyromegaly, no JVD, and no carotid bruits.  Lungs: normal respiratory effort, no accessory muscle use, normal breath sounds, no crackles, and no wheezes. Heart: normal rate, regular rhythm, no murmur, no gallop, and no rub.  Abdomen: soft, non-tender, normal bowel sounds, no distention, no guarding, no rebound tenderness, no hepatomegaly, and no splenomegaly.  Msk: no joint swelling, no joint warmth, and no redness over joints.  Pulses: 2+ DP/PT pulses bilaterally Extremities: No cyanosis, clubbing, edema. B/L LE no tenderness, erythema or swelling  Neurologic: alert & oriented X3, cranial nerves II-XII intact, strength normal in all extremities, sensation intact to light touch, and gait normal.  Skin: turgor normal and no rashes.  Psych: Oriented X3, memory intact for recent and remote, normally interactive, good eye contact, not anxious appearing, and not depressed appearing.   Lab results: Basic Metabolic Panel:  [facility] 11/03/11 1639  [name] [name] 141  K 3.7  CL 105  CO2 28  GLUCOSE 102*  BUN 14  CREATININE 0.98  CALCIUM 9.5  MG --  PHOS --   CBC:  Basename 11/03/11 1639  WBC 2.8*  NEUTROABS --  HGB 12.1*  HCT 36.7*  MCV 90.0  PLT 209   Cardiac Enzymes:  Basename 11/04/11 0115 11/03/11 1903  CKTOTAL 383* --  CKMB 3.5 --    CKMBINDEX -- --  TROPONINI <0.30 <0.30   Coagulation:  Basename 11/03/11 1639  LABPROT 70.4*  INR 8.36*   Urine Drug Screen: Drugs of Abuse     Component Value Date/Time   LABOPIA NONE DETECTED 08/11/2011 2331   COCAINSCRNUR NONE DETECTED 08/11/2011 2331   LABBENZ NONE DETECTED 08/11/2011 2331   AMPHETMU NONE DETECTED 08/11/2011 2331   THCU NONE DETECTED 08/11/2011 2331   LABBARB NONE DETECTED 08/11/2011 2331     Imaging results:  Dg Chest 2 View  11/03/2011  *RADIOLOGY REPORT*  Clinical Data: Chest pressure.  CHEST - 2 VIEW  Comparison: 08/11/2011  Findings: Left base scarring or atelectasis again noted, unchanged. Heart is normal size.  Right lung is clear.  No effusions.  No acute bony abnormality.  IMPRESSION: Stable left base scarring or atelectasis.  Original Report Authenticated By: Cyndie Chime, M.D.    Other results: EKG: NSR. RBBB  Assessment & Plan by Problem:  # CP - Patient presents with chest pain that started last week which waxes and wanes, currently CP free, has no history of significant CAD,  last catheterization done on 03/2007 which showed mild disease with 30% stenosis of LM and prox LAD, 30-50% stenosis of D1, and 30% stenosis of PDA. Last 2-D echo was 4/11 which showed EF 55% with G1 diastolic dysfunction. EKG today showed: RBBB, this is unchanged from prior exam.    Differentials for his CP include Angina vs ACS vs MSK vs GERD. Given the patient's history and presentation he is most likely having stable angina but at this point cannot fully rule out ACS  Plan: -Will admit to TELE -Cycle CE x3  -Will start ASA, Statin, and nitroglycerine.  # Supratherapeutic INR - Patient has been on warfarin indefinitely since his last PE in 06/2010, he was also noted to have a PE in 2010.  He states that he has not been compliant with appointment at [facility]. Today his INR is 8.3.   Plan: -Hold warfarin -Vit K 2.5mg  x1 -follow INR -Discuss with  family risk and benefits of continuing warfarin therapy given the risk may outweigh the benefit in his due to dangerous fluctuations of his INR.    # HTN, Continue all home meds # HLD: continue home Zocor # VTE: already supratherapeutic.    Signed: Keena Dinse 11/04/2011, 6:48 AM

## 2011-11-05 ENCOUNTER — Other Ambulatory Visit: Payer: Self-pay

## 2011-11-05 DIAGNOSIS — E785 Hyperlipidemia, unspecified: Secondary | ICD-10-CM

## 2011-11-05 LAB — CBC
HCT: 36.8 % — ABNORMAL LOW (ref 39.0–52.0)
MCH: 29.3 pg (ref 26.0–34.0)
MCHC: 32.6 g/dL (ref 30.0–36.0)
MCV: 90 fL (ref 78.0–100.0)
Platelets: 197 10*3/uL (ref 150–400)
RDW: 14.7 % (ref 11.5–15.5)

## 2011-11-05 LAB — PROTIME-INR: Prothrombin Time: 25.5 seconds — ABNORMAL HIGH (ref 11.6–15.2)

## 2011-11-05 LAB — BASIC METABOLIC PANEL
BUN: 19 mg/dL (ref 6–23)
Calcium: 9.2 mg/dL (ref 8.4–10.5)
Creatinine, Ser: 1.62 mg/dL — ABNORMAL HIGH (ref 0.50–1.35)
GFR calc Af Amer: 45 mL/min — ABNORMAL LOW (ref >90)

## 2011-11-05 MED ORDER — WARFARIN SODIUM 5 MG PO TABS: 7.5000 mg | ORAL_TABLET | Freq: Every day | ORAL | Status: DC

## 2011-11-05 MED ORDER — ATORVASTATIN CALCIUM 40 MG PO TABS: 40.0000 mg | ORAL_TABLET | Freq: Every day | ORAL | Status: DC

## 2011-11-05 MED ORDER — SODIUM CHLORIDE 0.9 % IV BOLUS (SEPSIS)
1000.0000 mL | Freq: Once | INTRAVENOUS | Status: AC
  Administered 2011-11-05: 1000 mL via INTRAVENOUS

## 2011-11-05 NOTE — Discharge Summary (Signed)
Internal Medicine Teaching Service Attending Note Date: 11/05/2011  Patient name: [name] [name]  Medical record number: [id number]  Date of birth: 1931-04-07    This patient has been seen and discussed with the house staff. Please see their note for complete details. I concur with their findings with the following additions/corrections: patient has had his medications reviewed with him by pharmacist so that he understands the recent change in his coumadin dose, in order to avoid supra-therapeutic INR. He is to follow up in coumadin clinic in 3 days for repeat blood work. Patient stable to discharge home with his son today.  Judyann Munson 11/05/2011, 5:32 PM

## 2011-11-05 NOTE — Progress Notes (Signed)
D/c instructions given to son. Informed of f/u appt. No questions asked. Wheeled downstairs with sitter, Daivd.

## 2011-11-05 NOTE — Discharge Summary (Signed)
Internal Medicine Teaching [facility] Discharge Note  Name: [name] [name] MRN: [id number] DOB: [birth year] 78 y.o.  Date of Admission: 11/03/2011  4:10 PM Date of Discharge: 11/05/2011 Attending Physician: Dr. Judyann Munson  Discharge Diagnosis: 1. Supratherapeutic INR - INR = 8.4 on admission, no evidence of bleeding, INR = 2.3 at discharge 2. Atypical Chest Pain/GERD - presented with non-exertional atypical chest pain similar to his chronic GERD symptoms.  CE's neg x3, EKG unchanged. 3. Hyperlipidemia - changed simvastatin to atorvastatin due to poorly controlled LDL 4. Hypertension 5. Acute kidney injury  Discharge Medications: Medication List  As of 11/05/2011  3:31 PM   STOP taking these medications         simvastatin 40 MG tablet         TAKE these medications         ALPRAZolam 0.5 MG tablet   Commonly known as: XANAX   Take 0.5 mg by mouth 2 (two) times daily as needed. For anxiety      amitriptyline 100 MG tablet   Commonly known as: ELAVIL   Take 100 mg by mouth at bedtime.      amLODipine 10 MG tablet   Commonly known as: NORVASC   Take 10 mg by mouth daily.      aspirin 81 MG tablet   Take 81 mg by mouth daily.      atorvastatin 40 MG tablet   Commonly known as: LIPITOR   Take 1 tablet (40 mg total) by mouth daily.      hydrochlorothiazide 12.5 MG capsule   Commonly known as: MICROZIDE   Take 12.5 mg by mouth daily.      ibuprofen 200 MG tablet   Commonly known as: ADVIL,MOTRIN   Take 800 mg by mouth every 8 (eight) hours as needed. For pain.      lisinopril 40 MG tablet   Commonly known as: PRINIVIL,ZESTRIL   Take 40 mg by mouth daily.      omeprazole 20 MG capsule   Commonly known as: PRILOSEC   Take 20 mg by mouth daily.      warfarin 5 MG tablet   Commonly known as: COUMADIN   Take 1.5 tablets (7.5 mg total) by mouth daily.      ZICAM COLD REMEDY PO   Take 2 tablets by mouth daily.            Disposition and follow-up:     [name] [name] was discharged from [facility] in stable and improved condition, with return of INR to therapeutic range and resolution of chest pain.  The patient will follow-up with Dr. Alexandria Lodge in the Coumadin Clinic on 11/08/11 for INR check and adjustment of warfarin dose.  The patient will also follow-up with Dr. Saralyn Pilar in the Internal Medicine Clinic on 11/08/11.  The patient should have a BMET checked to ensure normalization of creatinine after AKI.  Follow-up Appointments: Discharge Orders    Future Appointments: Provider: Department: Dept Phone: Center:   11/08/2011 8:45 AM Suszanne Finch, DO [facility] [phone] [facility]   11/08/2011 2:00 PM [facility] [phone] [facility]     Future Orders Please Complete By Expires   Diet - low sodium heart healthy      Increase activity slowly      Discharge instructions      Comments:   You were hospitalized due to your risk of bleeding, due to an overuse of Warfarin.  To prevent this from happening in the future, it is important to follow-up closely with a physician to monitor your Warfarin.  For now, take 1.5 tablets of warfarin (7.5 mg) daily, and follow-up with the Coumadin Clinic on Monday (2/18)   Call MD for:  temperature >100.4      Call MD for:  persistant nausea and vomiting      Call MD for:      Comments:   Uncontrolled bleeding      Consultations: None  Procedures Performed:  Dg Chest 2 View  11/03/2011  *RADIOLOGY REPORT*  Clinical Data: Chest pressure.  CHEST - 2 VIEW  Comparison: 08/11/2011  Findings: Left base scarring or atelectasis again noted, unchanged. Heart is normal size.  Right lung is clear.  No effusions.  No acute bony abnormality.  IMPRESSION: Stable left base scarring or atelectasis.  Original Report Authenticated By: Cyndie Chime, M.D.   Admission HPI:  This is a 78 year old male with PMH of PE x2 on chronic Coumadin, HTN, and old CVA who  presents with chest pain.  Patient states that he has chest pain located at midsternal and epigastric area on and off x 1 week. He complained of an episode of chest pain that awoke him from his nap this afternoon. His chest pain is aching, 6/10, no radiation and lasts for 2 hours before it resolved on its own. Patient states that he has had chronic epigastric pain which usually relieved by Prilosec. And his chest pain is very similar to his chronic epigastric pain. Patient went to urgent care where his pain had subsided. He was subsequently transferred to [facility] ED for further evaluation, and he was found to have INR of 8.36.  No headache, fever, or sore throat. No shortness of breath or dyspnea on exertion. No chest pressure or palpitation.  No nausea, vomiting, or abdominal pain. No melena, diarrhea or incontinence. No muscle weakness.  Admission Physical Exam Blood pressure 113/72, pulse 72, temperature 98.8 F (37.1 C), temperature source Oral, resp. rate 20, height 5\' 8"  (1.727 m), weight 168 lb (76.204 kg), SpO2 93.00%.  No signs of active bleeding.  General: alert, well-developed, and cooperative to examination.  Head: normocephalic and atraumatic.  Eyes: vision grossly intact, pupils equal, pupils round, pupils reactive to light, no injection and anicteric.  Mouth: pharynx pink and moist, no erythema, and no exudates.  Neck: supple, full ROM, no thyromegaly, no JVD, and no carotid bruits.  Lungs: normal respiratory effort, no accessory muscle use, normal breath sounds, no crackles, and no wheezes. Heart: normal rate, regular rhythm, no murmur, no gallop, and no rub.  Abdomen: soft, non-tender, normal bowel sounds, no distention, no guarding, no rebound tenderness, no hepatomegaly, and no splenomegaly.  Msk: no joint swelling, no joint warmth, and no redness over joints.  Pulses: 2+ DP/PT pulses bilaterally Extremities: No cyanosis, clubbing, edema. B/L LE no tenderness, erythema or swelling    Neurologic: alert & oriented X3, cranial nerves II-XII intact, strength normal in all extremities, sensation intact to light touch, and gait normal.  Skin: turgor normal and no rashes.  Psych: Oriented X3, memory intact for recent and remote, normally interactive, good eye contact, not anxious appearing, and not depressed appearing.  Admission Labs Na: 141, K: 3.7, Cl: 105, CO2: 28, BUN: 14, Cr: 0.98, glucose: 102 WBC: 2.8, Hb: 12.1, HCT: 36.7, Plt: 209  Hospital Course by problem list: 1. Supratherapeutic INR - The patient presents with INR = 8.4, with no signs  of active bleeding.  The patient was last seen in the coumadin clinic about 2 months prior, and was discharged on warfarin 7.5 mg/day.  The patient did not attend his next appointment, and was lost to follow-up.  He presents on a warfarin regimen of 10 mg 6 days/week, and 5 mg once/week.  The patient was given 2.5 mg of PO vit K, then 1.3 mg of IV vit K, and his INR slowly decreased to 2.3 at discharge.  The patient was discharged on warfarin 7.5 mg daily, with follow-up in coumadin clinic in 3 days.  2. Chest Pain/GERD - The patient presents with non-exertional atypical chest pain, similar to his chronic symptoms of GERD.  EKG showed RBBB unchanged from past EKG, and troponins were negative x3.  The patient was treated with protonix, with resolution of his chest pain shortly after admission, with no recurrence of pain.  3. Hyperlipidemia - The patient has a history of hyperlipidemia, managed with simvastatin 40.  The patient's LDL was rechecked, and found to be 151.  The patient's statin was changed from simvastatin to atorvastatin 40, to improve cholesterol control.  4. Hypertension - The patient has a history of chronic hypertension, and was normotensive throughout admission on his home regimen of amlodipine, HCTZ, and lisinopril.  5. Acute kidney injury - The patient had poor PO intake on day 1 of hospitalization, and had a small  elevation in creatinine on the morning of day 2 of hospitalization.  The patient was given a saline bolus, and will follow-up with a repeat BMET in the Internal Medicine Clinic on 11/08/11.  6. Social Situation - the patient lives at home with his wife, who has Alzheimer's disease.  I had long discussions with the patient's family during admission, and they agree that they would like to be more involved in the patient's care.  We discussed ways in which the patient's children could help him, such as creating a weekly pill box for the patient, and driving him to his appointments.  The family agreed that the patient could no longer drive, and the patient's son is a Teacher, early years/pre and expressed understanding of the patient's medication changes.  The patient was discharged home to live with his son for a few days, and then will return to his home in [place].  The patient would like to transition his care from the [facility] to a Primary Care physician and Coumadin Clinic in [place].  For now, the patient will continue to follow with our Clinic, but we will be happy to assist in this transition of care.  Time spent on discharge: 45 minutes  Discharge Vitals:  BP 98/62  Pulse 65  Temp(Src) 97.6 F (36.4 C) (Oral)  Resp 18  Ht 5\' 8"  (1.727 m)  Wt 164 lb 10.9 oz (74.7 kg)  BMI 25.04 kg/m2  SpO2 96%  Discharge Labs:  Results for orders placed during the hospital encounter of 11/03/11 (from the past 24 hour(s))  PROTIME-INR     Status: Abnormal   Collection Time   11/04/11  5:02 PM      Component Value Range   Prothrombin Time 37.1 (*) 11.6 - 15.2 (seconds)   INR 3.68 (*) 0.00 - 1.49   CBC     Status: Abnormal   Collection Time   11/05/11  6:00 AM      Component Value Range   WBC 3.6 (*) 4.0 - 10.5 (K/uL)   RBC 4.09 (*) 4.22 - 5.81 (  MIL/uL)   Hemoglobin 12.0 (*) 13.0 - 17.0 (g/dL)   HCT 40.9 (*) 81.1 - 52.0 (%)   MCV 90.0  78.0 - 100.0 (fL)   MCH 29.3  26.0 - 34.0 (pg)   MCHC 32.6   30.0 - 36.0 (g/dL)   RDW 91.4  78.2 - 95.6 (%)   Platelets 197  150 - 400 (K/uL)  BASIC METABOLIC PANEL     Status: Abnormal   Collection Time   11/05/11  6:00 AM      Component Value Range   Sodium 138  135 - 145 (mEq/L)   Potassium 3.8  3.5 - 5.1 (mEq/L)   Chloride 102  96 - 112 (mEq/L)   CO2 27  19 - 32 (mEq/L)   Glucose, Bld 94  70 - 99 (mg/dL)   BUN 19  6 - 23 (mg/dL)   Creatinine, Ser 2.13 (*) 0.50 - 1.35 (mg/dL)   Calcium 9.2  8.4 - 08.6 (mg/dL)   GFR calc non Af Amer 39 (*) >90 (mL/min)   GFR calc Af Amer 45 (*) >90 (mL/min)  PROTIME-INR     Status: Abnormal   Collection Time   11/05/11  6:00 AM      Component Value Range   Prothrombin Time 25.5 (*) 11.6 - 15.2 (seconds)   INR 2.28 (*) 0.00 - 1.49     Signed: Janalyn Harder 11/05/2011, 3:31 PM

## 2011-11-05 NOTE — Progress Notes (Signed)
Subjective: No acute events overnight.  Patient notes no chest pain this morning.  Patient wants to go home.  Objective: Vital signs in last 24 hours: Filed Vitals:   11/04/11 1000 11/04/11 1345 11/04/11 2031 11/05/11 0500  BP: 137/89 129/82 100/64 84/50  Pulse:  60 79 57  Temp:  97.7 F (36.5 C) 97.9 F (36.6 C) 97.6 F (36.4 C)  TempSrc:  Oral Oral Oral  Resp: 18 16 16 18   Height:      Weight:    164 lb 10.9 oz (74.7 kg)  SpO2: 96% 95% 98% 96%   Weight change: -3 lb 5.1 oz (-1.504 kg)  Intake/Output Summary (Last 24 hours) at 11/05/11 0816 Last data filed at 11/04/11 1700  Gross per 24 hour  Intake    360 ml  Output      1 ml  Net    359 ml    Physical Exam: General: alert, cooperative, NAD HEENT: pupils equal round and reactive to light, vision grossly intact, oropharynx clear and non-erythematous  Neck: supple, no lymphadenopathy Lungs: clear to ascultation bilaterally, normal work of respiration, no wheezes, rales, ronchi Heart: regular rate and rhythm, no murmurs, gallops, or rubs Abdomen: soft, non-tender, non-distended, normal bowel sounds Extremities: no cyanosis, clubbing, or edema Neurologic: alert & oriented X3, cranial nerves II-XII intact, strength grossly intact, sensation intact to light touch  Lab Results: Basic Metabolic Panel:  Lab 11/05/11 1610 11/04/11 0815  NA 138 140  K 3.8 4.2  CL 102 105  CO2 27 26  GLUCOSE 94 96  BUN 19 12  CREATININE 1.62* 0.98  CALCIUM 9.2 9.9  MG -- --  PHOS -- --   CBC:  Lab 11/05/11 0600 11/04/11 0815  WBC 3.6* 3.0*  NEUTROABS -- 1.1*  HGB 12.0* 13.6  HCT 36.8* 40.7  MCV 90.0 89.6  PLT 197 181   Cardiac Enzymes:  Lab 11/04/11 1305 11/04/11 0815 11/04/11 0115  CKTOTAL 378* 453* 383*  CKMB 3.6 3.9 3.5  CKMBINDEX -- -- --  TROPONINI <0.30 <0.30 <0.30   Fasting Lipid Panel:  Lab 11/04/11 0115  CHOL 247*  HDL 67  LDLCALC 151*  TRIG 146  CHOLHDL 3.7  LDLDIRECT --   Coagulation:  Lab 11/05/11  0600 11/04/11 1702 11/04/11 0815 11/03/11 1639  LABPROT 25.5* 37.1* 65.3* 70.4*  INR 2.28* 3.68* 7.59* 8.36*    Medications: I have reviewed the patient's current medications. Scheduled Meds:    . alum & mag hydroxide-simeth  30 mL Oral Once  . amitriptyline  100 mg Oral QHS  . amLODipine  10 mg Oral Daily  . aspirin EC  81 mg Oral Daily  . hydrochlorothiazide  12.5 mg Oral Daily  . lisinopril  40 mg Oral Daily  . pantoprazole  40 mg Oral Q1200  . phytonadione (VITAMIN K) IV  1.3 mg Intravenous Once  . simvastatin  40 mg Oral QHS  . sodium chloride  1,000 mL Intravenous Once   Continuous Infusions:  PRN Meds:.acetaminophen, nitroGLYCERIN, ondansetron (ZOFRAN) IV, zolpidem, DISCONTD: ALPRAZolam  Assessment/Plan: The patient is a 78 yo man, history of 2 prior PE's on long-term anticoagulation, HTN, prior CVA, presenting with chest pain and supratherapeutic INR.  # Chest pain - subsided this am, CE's neg x2, EKG with old RBBB, cath 03/2007 shows 25% stenosis of LM and prox LAD, 30-50% stenosis at 1st diagonal, 25% stenosis at RM, 30% stenosis at PDA.  Echo 01/16/10 shows EF 55% with no wall motion abnormalities.  Chest pain more consistent with GERD vs msk. -CE's neg x3 -aspirin, statin, nitro  # Supratherapeutic INR - 2 prior PE's in 2010 and 2011, on life-long coumadin, last coumadin clinic appt 08/30/11, then patient lost to follow-up.  Last coumadin clinic visit note says take 7.5 mg daily, but med rec on admission reveals instructions to take 10 mg daily except 5 mg Monday.  No evidence of acute bleeding.  Lab 11/05/11 0600 11/04/11 1702 11/04/11 0815 11/03/11 1639  INR 2.28* 3.68* 7.59* 8.36*  -resolved, f/u in coumadin clinic in 3 days -prescribed coumadin 7.5 mg daily until follow-up  # HTN - chronic, stable -amlodipine, HCTZ, lisinopril  # HLD - LDL = 157, previously 151 in november -switching from simvastatin to atorvastatin  # Dispo - home today    LOS: 2 days    [name] [name] 11/05/2011, 8:16 AM

## 2011-11-05 NOTE — H&P (Signed)
Internal Medicine Teaching Service Attending Note Date: 11/04/2011 Patient name: [name] [name]  Medical record number: [id number]  Date of birth: 1930/11/02   I have seen and evaluated [name] [name] and discussed their care with the Residency Team.  [name] [name] is a 78 year old male with PMH of PE x2 on chronic Coumadin, HTN, and old CVA who presents with chest pain that easily spontaneously resolved on admit but was coincidentally found to have a supratherapeutic INR of 8.36. For his chest pain, patient had serial troponins checked to ensure that he did not have ACS. He was also given vitamin K in order to reverse his INR to goal of 2-3.   Physical Exam: Blood pressure 98/62, pulse 65, temperature 97.6 F (36.4 C), temperature source Oral, resp. rate 18, height 5\' 8"  (1.727 m), weight 164 lb 10.9 oz (74.7 kg), SpO2 96.00%. General: alert, well-developed, and cooperative to examination.  Head: normocephalic and atraumatic.  Eyes: vision grossly intact, pupils equal, pupils round, pupils reactive to light, no injection and anicteric.  Mouth: pharynx pink and moist, no erythema, and no exudates.  Neck: supple, full ROM, no thyromegaly, no JVD, and no carotid bruits.  Lungs: normal respiratory effort, no accessory muscle use, normal breath sounds, no crackles, and no wheezes. Heart: normal rate, regular rhythm, no murmur, no gallop, and no rub.  Abdomen: soft, non-tender, normal bowel sounds, no distention, no guarding, no rebound tenderness, no hepatomegaly, and no splenomegaly.  Msk: no joint swelling, no joint warmth, and no redness over joints.  Pulses: 2+ DP/PT pulses bilaterally Extremities: No cyanosis, clubbing, edema. B/L LE no tenderness, erythema or swelling  Neurologic: alert & oriented X3, cranial nerves II-XII intact, strength normal in all extremities, sensation intact to light touch, and gait normal.  Skin: turgor normal and no rashes.    Lab results: Results for orders  placed during the hospital encounter of 11/03/11 (from the past 24 hour(s))  PROTIME-INR     Status: Abnormal   Collection Time   11/04/11  5:02 PM      Component Value Range   Prothrombin Time 37.1 (*) 11.6 - 15.2 (seconds)   INR 3.68 (*) 0.00 - 1.49   CBC     Status: Abnormal   Collection Time   11/05/11  6:00 AM      Component Value Range   WBC 3.6 (*) 4.0 - 10.5 (K/uL)   RBC 4.09 (*) 4.22 - 5.81 (MIL/uL)   Hemoglobin 12.0 (*) 13.0 - 17.0 (g/dL)   HCT 86.5 (*) 78.4 - 52.0 (%)   MCV 90.0  78.0 - 100.0 (fL)   MCH 29.3  26.0 - 34.0 (pg)   MCHC 32.6  30.0 - 36.0 (g/dL)   RDW 69.6  29.5 - 28.4 (%)   Platelets 197  150 - 400 (K/uL)  BASIC METABOLIC PANEL     Status: Abnormal   Collection Time   11/05/11  6:00 AM      Component Value Range   Sodium 138  135 - 145 (mEq/L)   Potassium 3.8  3.5 - 5.1 (mEq/L)   Chloride 102  96 - 112 (mEq/L)   CO2 27  19 - 32 (mEq/L)   Glucose, Bld 94  70 - 99 (mg/dL)   BUN 19  6 - 23 (mg/dL)   Creatinine, Ser 1.32 (*) 0.50 - 1.35 (mg/dL)   Calcium 9.2  8.4 - 44.0 (mg/dL)   GFR calc non Af Amer 39 (*) >90 (mL/min)   GFR calc Af  Amer 45 (*) >90 (mL/min)  PROTIME-INR     Status: Abnormal   Collection Time   11/05/11  6:00 AM      Component Value Range   Prothrombin Time 25.5 (*) 11.6 - 15.2 (seconds)   INR 2.28 (*) 0.00 - 1.49     Imaging results:  Dg Chest 2 View  11/03/2011  *RADIOLOGY REPORT*  Clinical Data: Chest pressure.  CHEST - 2 VIEW  Comparison: 08/11/2011  Findings: Left base scarring or atelectasis again noted, unchanged. Heart is normal size.  Right lung is clear.  No effusions.  No acute bony abnormality.  IMPRESSION: Stable left base scarring or atelectasis.  Original Report Authenticated By: Cyndie Chime, M.D.    Assessment and Plan: I agree with the formulated Assessment and Plan with the following changes: will recheck INR in the afternoon to re-assess how much additional vitamin K the patient may need. Will review strategies to  ensure proper medication adherence and follow up into coumadin clinic.  Duke Salvia Drue Second MD MPH [facility] [phone]

## 2011-11-05 NOTE — Discharge Instructions (Signed)
Warfarin Coagulopathy  Warfarin (Coumadin) coagulopathy refers to bleeding that may occur as a complication of the medication warfarin. Warfarin is an oral blood thinner (anticoagulant). Warfarin is used for many problems where thinning of the blood is needed to prevent blood clots. It usually takes 3 to 4 days of treatment with warfarin for the blood to be thinned to the target range. Blood tests will be done routinely to measure how fast your blood clots. The results of the blood tests will help determine your anticoagulant dose.  Every medication has potential side effects or complications. Bleeding is the most common and most serious complication of warfarin. The amount of bleeding may be related to the dose of warfarin, the length of treatment, diet, underlying medical conditions, and the use of other medications or supplements.  CAUSES   Intentional or accidental overdose.   Medication, herbal, supplement, or alcohol interactions.   Dietary changes.   Underlying medical conditions.  SYMPTOMS  Severe bleeding may occur from any tissue or organ. Symptoms of the blood being too thin may include:   Bleeding from the nose or gums that does not stop quickly.   Unusual bruising or bruising easily.   Swelling or pain at an injection site.   A cut that does not stop bleeding within 10 minutes.   Continual nausea for more than 1 day or vomiting blood.   Coughing up blood.   Blood in the urine which may appear as pink, red, or Wanetta Funderburke urine.   Blood in bowel movements which may appear as red, dark or black stools.   Sudden weakness or numbness of the face, arm, or leg, especially on one side of the body.   Sudden confusion.   Trouble speaking (aphasia) or understanding.   Sudden trouble seeing in one or both eyes.   Sudden trouble walking.   Dizziness.   Loss of balance or coordination.   Severe pain, such as a headache, joint pain, or back pain.   Fever.  HOME CARE INSTRUCTIONS   Follow up with your  laboratory test and caregiver appointments as directed. It is very important to keep your appointments. Not keeping appointments could result in a chronic or permanent injury, pain, or disability.   Do not resume taking warfarin until directed to do so by your caregiver. Take warfarin exactly as directed by your caregiver. It is recommended that you take your warfarin dose at the same time of the day. It is preferred that you take your warfarin in the evening. This allows you to get your laboratory test results and if necessary, adjust your warfarin dose in a timely manner. Follow your caregiver's instructions if you accidentally take an extra dose or miss a dose of warfarin. It is very important to take warfarin as directed since bleeding or blood clots could result in chronic or permanent injury, pain, or disability.   Some foods interfere with the effectiveness of warfarin. Eating large amounts of foods high in vitamin K can cause warfarin to be less effective. Changing to a diet low in foods containing vitamin K may lead to an excessive warfarin effect. Eat what you normally eat and keep the vitamin K content of your diet consistent. Consult your caregiver before making major dietary changes.   Some vitamins, supplements, and herbal products interfere with the effectiveness of warfarin. Vitamin E may increase the anticoagulant effects of warfarin. Vitamin K may can cause warfarin to be less effective. Consult your caregiver before changing or taking a   Several medications interfere with the effectiveness of warfarin. Pain relieving medications, antibiotics, and medications that decrease stomach acid are examples of some medications that can lead to an excessive warfarin effect. Warfarin may also interfere with the effectiveness of your other medicines. Consult your caregiver before stopping, changing, or taking new  medications.   Some medical conditions may increase your risk for bleeding while you are taking warfarin. A fever, diarrhea lasting more than a day, worsening heart failure, or worsening liver function are some medical conditions that could affect warfarin. Contact your caregiver if you have any of these medical conditions.   Be careful not to cut yourself when using sharp objects.   Avoid heavy or variable alcohol use. Consume alcohol only in very limited quantities. General alcohol intake guidelines are 1 drink for women and 2 drinks for men per day. (1 drink = 5 ounces of wine, 12 ounces of beer, or 1 ounces of liquor). A sudden increase in alcohol use can increase your risk of bleeding. Chronic alcohol use can cause warfarin to be less effective.   Limit physical activities or sports that could result in a fall or cause injury.   Inform all your caregivers and your dentist that you take warfarin.  SEEK IMMEDIATE MEDICAL CARE IF:  Bleeding from the nose or gums does not stop quickly.   You have unusual bruising or are bruising easily.   Swelling or pain occurs at an injection site.   A cut does not stop bleeding within 10 minutes.   You have continual nausea for more than 1 day or are vomiting blood.   You are coughing up blood.   You have blood in the urine.   You have dark or black stools.   You have sudden weakness or numbness of the face, arm, or leg, especially on one side of the body.   You have sudden confusion.   You have trouble speaking (aphasia) or understanding.   You have sudden trouble seeing in one or both eyes.   You have sudden trouble walking.   You have dizziness.   You have a loss of balance or coordination.   You have severe pain, such as a headache, joint pain, or back pain.   You have a serious fall or head injury, even if you are not bleeding.   You have an oral temperature above 102 F (38.9 C), not controlled by medicine.  Any of these  symptoms may represent a serious problem that is an emergency. Do not wait to see if the symptoms will go away. Get medical help right away. Call your local emergency services (911 in [place] DO NOT drive yourself to the hospital. Document Released: 08/15/2006 Document Revised: 05/19/2011 Document Reviewed: 08/21/2007 [facility] Patient Information 2012 [place], [place].

## 2011-11-08 ENCOUNTER — Ambulatory Visit (INDEPENDENT_AMBULATORY_CARE_PROVIDER_SITE_OTHER): Payer: Medicare Other | Admitting: Internal Medicine

## 2011-11-08 ENCOUNTER — Encounter: Payer: Self-pay | Admitting: Internal Medicine

## 2011-11-08 ENCOUNTER — Ambulatory Visit (INDEPENDENT_AMBULATORY_CARE_PROVIDER_SITE_OTHER): Payer: Medicare Other | Admitting: Pharmacist

## 2011-11-08 ENCOUNTER — Ambulatory Visit: Payer: Medicare Other

## 2011-11-08 VITALS — BP 121/84 | HR 85 | Temp 97.1°F | Wt 171.9 lb

## 2011-11-08 DIAGNOSIS — I10 Essential (primary) hypertension: Secondary | ICD-10-CM

## 2011-11-08 DIAGNOSIS — Z7901 Long term (current) use of anticoagulants: Secondary | ICD-10-CM

## 2011-11-08 DIAGNOSIS — Z7902 Long term (current) use of antithrombotics/antiplatelets: Secondary | ICD-10-CM

## 2011-11-08 DIAGNOSIS — I2699 Other pulmonary embolism without acute cor pulmonale: Secondary | ICD-10-CM

## 2011-11-08 DIAGNOSIS — G3184 Mild cognitive impairment, so stated: Secondary | ICD-10-CM

## 2011-11-08 DIAGNOSIS — N179 Acute kidney failure, unspecified: Secondary | ICD-10-CM

## 2011-11-08 NOTE — Progress Notes (Signed)
Anti-Coagulation Progress Note  [name] [name] is a 78 y.o. male who is currently on an anti-coagulation regimen.    RECENT RESULTS: Recent results are below, the most recent result is correlated with a dose of 52.5 mg. per week: Lab Results  Component Value Date   INR 2.80 11/08/2011   INR 2.28* 11/05/2011   INR 3.68* 11/04/2011    ANTI-COAG DOSE:   Latest dosing instructions   Total Sun Mon Tue Wed Thu Fri Sat   50 7.5 mg 7.5 mg 7.5 mg 5 mg 7.5 mg 7.5 mg 7.5 mg    (5 mg1.5) (5 mg1.5) (5 mg1.5) (5 mg1) (5 mg1.5) (5 mg1.5) (5 mg1.5)         ANTICOAG SUMMARY: Anticoagulation Episode Summary              Current INR goal 2.0-3.0 Next INR check 12/06/2011   INR from last check 2.80 (11/08/2011)     Weekly max dose (mg)  Target end date Indefinite   Indications Pulmonary embolism, Encounter for long-term use of antiplatelets/antithrombotics   INR check location [facility] Preferred lab    Send INR reminders to ANTICOAG IMP   Comments History of multiple venous embolic episodes. Will continue to annual re-evaluate continued need for warfarin weighing risks vs. benefits.        Provider Role Specialty Phone number   Farley Ly, MD  Internal Medicine [phone]        ANTICOAG TODAY: Anticoagulation Summary as of 11/08/2011              INR goal 2.0-3.0     Selected INR 2.80 (11/08/2011) Next INR check 12/06/2011   Weekly max dose (mg)  Target end date Indefinite   Indications Pulmonary embolism, Encounter for long-term use of antiplatelets/antithrombotics    Anticoagulation Episode Summary              INR check location [facility] Preferred lab    Send INR reminders to [facility]TICOAG [facility]   Comments History of multiple venous embolic episodes. Will continue to annual re-evaluate continued need for warfarin weighing risks vs. benefits.        Provider Role Specialty Phone number   Farley Ly, MD  Internal Medicine [phone]        PATIENT  INSTRUCTIONS: Patient Instructions  Patient instructed to take medications as defined in the Anti-coagulation Track section of this encounter.  Patient instructed to take today's dose.  Patient verbalized understanding of these instructions.        FOLLOW-UP Return in 4 weeks (on 12/06/2011) for Follow up INR.  [name], [name] Pharm.D., CACP

## 2011-11-08 NOTE — Assessment & Plan Note (Signed)
BP Readings from Last 3 Encounters:  11/08/11 121/84  11/05/11 98/62  08/17/11 139/88    Basic Metabolic Panel:    Component Value Date/Time   NA 138 11/05/2011 0600   K 3.8 11/05/2011 0600   CL 102 11/05/2011 0600   CO2 27 11/05/2011 0600   BUN 19 11/05/2011 0600   CREATININE 1.62* 11/05/2011 0600   GLUCOSE 94 11/05/2011 0600   CALCIUM 9.2 11/05/2011 0600    Assessment: Status:  Patient is forgetting his medications, occasionally actually losing the bottles, and will then double up his medications to make up for times that he misses.  Disease Control: controlled  Progress toward goals: unchanged  Barriers to meeting goals: nonadherence to medications and lack of understanding of disease management   Plan:  continue current medications  Will set up for [facility] for medication management and education.  Patient educated that doubling his medications, etc is a very dangerous practice - therefore, a system must be established that will allow more regular medication therapy.  When he follows-up next time, can consider change of therapy to simplify regimen - such as to change to combination pill of lisinopril-HCTZ, and see if amlodipine is even needed.  Check BMET today.

## 2011-11-08 NOTE — Progress Notes (Signed)
Subjective:   Patient ID: [name] [name] male   DOB: 01-04-1931 78 y.o.   MRN: [id number]  HPI: [name] [name] is a 78 y.o. with a PMHx of hypertension, chronic anticoagulation, , who presented to clinic today for the following:  1) Hospital Follow-up: Patient was hospitalized at [facility] from February 13 - 15, 2013  for evaluation of the following:  Supratherapeutic INR (admission INR 8.4) - patient was treated with oral and IV vitamin K with discharge INR of 2.3, there was no evidence of acute bleed throughout hospital course. It was unclear during the hospital course why the patient was so supratherapeutic of his coumadin on admission, although, it was noted that the patient had not followed-up in the coumadin clinic for approximately 2 months. The patient clarifies to me that he was taking 2 pills a day, and that he misses his medications a few times a week, and if he misses them, he will just double up the next day (for his coumadin and blood pressure medications). He also frequently loses his medication bottles. He understands that this is dangerous, but he is very busy taking care of his wife with dementia.   Atypical chest pain - patient described non-exertional chest pain on admission which was determined to be likely secondary to uncontrolled GERD as pt had been out of his PPI for several days - CE negative x 3, no new EKG changes. Today, pt denies chest pain, palpitations, shortness of breath after resumption of his PPI.   Acute kidney injury - on admission, Cr was within normal limits, however, during hospital course that peaked at a creatinine of 1.6 thought to be due to decreased oral intake during hospital course. Patient denies reduced or increased urinary frequency, dysuria, hematuria.    2) HTN - Patient does not check blood pressure regularly at home. Currently taking amlodipine (Norvase), hydrochlorothiazide (HCTZ) and lisinopril (Prinivil). denies headaches, dizziness,  lightheadedness, chest pain, shortness of breath.  does not request refills today.    Review of Systems: Per HPI.  Current Outpatient Medications: Medication Sig  . ALPRAZolam (XANAX) 0.5 MG tablet Take 0.5 mg by mouth 2 (two) times daily as needed. For anxiety  . amitriptyline (ELAVIL) 100 MG tablet Take 100 mg by mouth at bedtime.   [name] amLODipine (NORVASC) 10 MG tablet Take 10 mg by mouth daily.  [name] aspirin 81 MG tablet Take 81 mg by mouth daily.    [name] atorvastatin (LIPITOR) 40 MG tablet Take 1 tablet (40 mg total) by mouth daily.  . Homeopathic Products (ZICAM COLD REMEDY PO) Take 2 tablets by mouth daily.  . hydrochlorothiazide (MICROZIDE) 12.5 MG capsule Take 12.5 mg by mouth daily.  [name] ibuprofen (ADVIL,MOTRIN) 200 MG tablet Take 800 mg by mouth every 8 (eight) hours as needed. For pain.  [name] lisinopril (PRINIVIL,ZESTRIL) 40 MG tablet Take 40 mg by mouth daily.  [name] omeprazole (PRILOSEC) 20 MG capsule Take 20 mg by mouth daily.    [name] warfarin (COUMADIN) 5 MG tablet Take 1.5 tablets (7.5 mg total) by mouth daily.    Allergies  Allergen Reactions  . Other     Shrimp    Past Medical History  Diagnosis Date  . PE (pulmonary embolism)     unprovoked PE completed 6 months of warfarin, warfarin d/ced 02/12/2010, repeat PE 07/10/2010 after a long car ride and now on coumadin.  [name] Hypertension   . CVA (cerebral vascular accident)     MRI 12/10 negative except small vessel changes, CT  12/2009 old R thalamic infarct  . Pituitary microadenoma     incidental finding CT 12/2009  . Cancer   . Prostate cancer   . PE (pulmonary embolism)     Past Surgical History  Procedure Date  . Prostatectomy      Objective:   Physical Exam: Filed Vitals:   11/08/11 0910  BP: 121/84  Pulse: 85  Temp: 97.1 F (36.2 C)      General: Vital signs reviewed and noted. Well-developed, well-nourished, in no acute distress; alert, appropriate and cooperative throughout examination.  Head: Normocephalic,  atraumatic.  Lungs:  Normal respiratory effort. Clear to auscultation BL without crackles or wheezes.  Heart: RRR. S1 and S2 normal without gallop, rubs, (+) murmur.  Abdomen:  BS normoactive. Soft, Nondistended, non-tender.  No masses or organomegaly.  Extremities: No pretibial edema.    Assessment & Plan:  Case and plan of care discussed with Dr. Ulyess Mort.

## 2011-11-08 NOTE — Patient Instructions (Signed)
Patient instructed to take medications as defined in the Anti-coagulation Track section of this encounter.  Patient instructed to take today's dose.  Patient verbalized understanding of these instructions.    

## 2011-11-08 NOTE — Patient Instructions (Signed)
   Please follow-up at the clinic in 1-2 weeks with your PCP, at which time we will reevaluate your blood pressure, coumadin  - OR, please follow-up in the clinic sooner if needed.  There have not been changes in your medications.  Please make sure to take your medications regularly without missing doses.  Please get set up with the home health nurses to help with your medications.  If you have been started on new medication(s), and you develop symptoms concerning for allergic reaction, including, but not limited to, throat closing, tongue swelling, rash, please stop the medication immediately and call the clinic at [phone], and go to the ER.  If you are diabetic, please bring your meter to your next visit.  If symptoms worsen, or new symptoms arise, please call the clinic or go to the ER.  Please bring all of your medications in a bag to your next visit.

## 2011-11-08 NOTE — Assessment & Plan Note (Signed)
Assessment: Status: Patient has been inconsistent in taking his coumadin, was apparently taking 2 pills of the 5mg  daily as well as missing medications and doubling them when that happens.  Disease Control: controlled  Progress toward goals: at goal  Barriers to meeting goals: lack of understanding of disease management   Plan:      As per Dr. Alexandria Lodge  Patient educated the dangers of improper coumadin dosing and monitoring - he expresses understanding  Will set up with [facility] for medication management - see if developing a system will provide the structure needed to help with medication adherence.  Will reevaluate in 2 weeks. If not able to get more consistent medication management with [facility], consider change to rivaroxiban.

## 2011-11-08 NOTE — Assessment & Plan Note (Signed)
Discharge Cr of 1.6, admission Cr < 1, thought prerenal due to decreased oral intake during hospital course.  Recheck BMET.

## 2011-11-09 ENCOUNTER — Emergency Department (INDEPENDENT_AMBULATORY_CARE_PROVIDER_SITE_OTHER): Payer: Medicare Other

## 2011-11-09 ENCOUNTER — Encounter (HOSPITAL_COMMUNITY): Payer: Self-pay | Admitting: *Deleted

## 2011-11-09 ENCOUNTER — Emergency Department (INDEPENDENT_AMBULATORY_CARE_PROVIDER_SITE_OTHER)
Admission: EM | Admit: 2011-11-09 | Discharge: 2011-11-09 | Disposition: A | Discharge status: Home Health | Payer: Medicare Other | Source: Home / Self Care | Attending: Emergency Medicine | Admitting: Emergency Medicine

## 2011-11-09 DIAGNOSIS — S7000XA Contusion of unspecified hip, initial encounter: Secondary | ICD-10-CM

## 2011-11-09 HISTORY — DX: Depression, unspecified: F32.A

## 2011-11-09 HISTORY — DX: Major depressive disorder, single episode, unspecified: F32.9

## 2011-11-09 NOTE — ED Provider Notes (Signed)
History     CSN: [id number]  Arrival date & time 11/09/11  1420   First MD Initiated Contact with Patient 11/09/11 1529      Chief Complaint  Patient presents with  . Hip Pain    (Consider location/radiation/quality/duration/timing/severity/associated sxs/prior treatment) Patient is a 78 y.o. male presenting with fall. The history is provided by the patient. No language interpreter was used.  Fall The accident occurred 2 days ago. The fall occurred from a bed. He fell from a height of 1 to 2 ft. He landed on carpet. There was no blood loss. The pain is present in the left hip. The pain is at a severity of 5/10. The pain is moderate. He was ambulatory at the scene. There was no entrapment after the fall. There was no alcohol use involved in the accident. The symptoms are aggravated by use of the injured limb. He has tried nothing for the symptoms.  pt complains of pain in his hip and upper arm.   Pt reports he fell out of bed.  Family member reports pt was sleeping and rolled out of bed.  No impact of head.  No loss of conciousness.   Past Medical History  Diagnosis Date  . PE (pulmonary embolism)     unprovoked PE completed 6 months of warfarin, warfarin d/ced 02/12/2010, repeat PE 07/10/2010 after a long car ride and now on coumadin.  [name] Hypertension   . CVA (cerebral vascular accident)     MRI 12/10 negative except small vessel changes, CT 12/2009 old R thalamic infarct  . Pituitary microadenoma     incidental finding CT 12/2009  . Cancer   . Prostate cancer   . PE (pulmonary embolism)   . Depression     Past Surgical History  Procedure Date  . Prostatectomy     Family History  Problem Relation Age of Onset  . Hypertension Father     Passed away from cerebral hemorrhage at age of 63.  [name] Dementia Mother     Passed away  . Hypertension Child     4 adult children    History  Substance Use Topics  . Smoking status: Never Smoker   . Smokeless tobacco: Not on file  .  Alcohol Use: No      Review of Systems  Musculoskeletal: Positive for myalgias.  All other systems reviewed and are negative.    Allergies  Other  Home Medications   Current Outpatient Rx  Name Route Sig Dispense Refill  . ALPRAZOLAM 0.5 MG PO TABS Oral Take 0.5 mg by mouth 2 (two) times daily as needed. For anxiety    . AMITRIPTYLINE HCL 100 MG PO TABS Oral Take 100 mg by mouth at bedtime.     [name] AMLODIPINE BESYLATE 10 MG PO TABS Oral Take 10 mg by mouth daily.    . ASPIRIN 81 MG PO TABS Oral Take 81 mg by mouth daily.      . ATORVASTATIN CALCIUM 40 MG PO TABS Oral Take 1 tablet (40 mg total) by mouth daily. 30 tablet 1  . HYDROCHLOROTHIAZIDE 12.5 MG PO CAPS Oral Take 12.5 mg by mouth daily.    . IBUPROFEN 200 MG PO TABS Oral Take 800 mg by mouth every 8 (eight) hours as needed. For pain.    [name] LISINOPRIL 40 MG PO TABS Oral Take 40 mg by mouth daily.    [name] OMEPRAZOLE 20 MG PO CPDR Oral Take 20 mg by mouth daily.      [name]  WARFARIN SODIUM 5 MG PO TABS Oral Take 1.5 tablets (7.5 mg total) by mouth daily. 45 tablet 1  . ZICAM COLD REMEDY PO Oral Take 2 tablets by mouth daily.      BP 102/69  Pulse 76  Temp(Src) 97.9 F (36.6 C) (Oral)  Resp 18  SpO2 94%  Physical Exam  Nursing note and vitals reviewed. Constitutional: He is oriented to person, place, and time. He appears well-developed and well-nourished.  HENT:  Head: Normocephalic.  Eyes: Pupils are equal, round, and reactive to light.  Neck: Normal range of motion.  Cardiovascular: Normal rate and normal heart sounds.   Pulmonary/Chest: Effort normal.  Abdominal: Soft.  Musculoskeletal: He exhibits tenderness.       Tender left hip,  Pain with movement.  Tender left forearm  Neurological: He is alert and oriented to person, place, and time.  Skin: Skin is warm.  Psychiatric: He has a normal mood and affect.    ED Course  Procedures (including critical care time)  Labs Reviewed - No data to display Dg Forearm  Left  11/09/2011  *RADIOLOGY REPORT*  Clinical Data: Larey Seat out of bed 2 days ago and injured left forearm.  LEFT FOREARM - 2 VIEW 11/09/2011:  Comparison: None.  Findings: No acute fractures involving the radius or ulna.  Well- preserved bone mineral density.  No intrinsic osseous abnormality. Visualized elbow joint intact.  Mild radiocarpal joint space narrowing.  IMPRESSION: No osseous abnormality involving the radius or ulna.  Mild degenerative changes involving the wrist (radiocarpal joint).  Original Report Authenticated By: Arnell Sieving, M.D.   Dg Hip Complete Left  11/09/2011  *RADIOLOGY REPORT*  Clinical Data: Larey Seat out of bed 2 days ago with left-sided pain  LEFT HIP - COMPLETE 2+ VIEW  Comparison: None.  Findings: There is only mild degenerative change in the hips. There is more prominent degenerative change in the lower lumbar spine.  No acute fracture is seen.  The pelvic rami are intact. Multiple surgical clips are present within the pelvis.  The SI joints appear well corticated.  IMPRESSION: No acute fracture.  Degenerative change particularly in the lower lumbar spine  Original Report Authenticated By: Juline Patch, M.D.   Dg Humerus Left  11/09/2011  *RADIOLOGY REPORT*  Clinical Data: Fall 2 days ago.  Left-sided pain.  LEFT HUMERUS - 2+ VIEW  Comparison: None.  Findings: The elbow and shoulder joints are located.  No acute bone or soft tissue abnormality is evident.  IMPRESSION: Negative left humerus.  Original Report Authenticated By: Jamesetta Orleans. MATTERN, M.D.     No diagnosis found.    MDM  No fx,  Pt has hx of dementia        Langston Masker, [place] 11/09/11 1657

## 2011-11-09 NOTE — ED Notes (Signed)
Pt reported he fell at his daughter's 2 days ago and landed on his left side.  He has been ambulating since then.  Today he c/o pain left hip and  Left upper arm

## 2011-11-09 NOTE — Discharge Instructions (Signed)
Contusion A bruise (contusion) or hematoma is a collection of blood under skin causing an area of discoloration. It is caused by an injury to blood vessels beneath the injured area with a release of blood into that area. As blood accumulates it is known as a hematoma. This collection of blood causes a blue to dark blue color. As the injury improves over days to weeks it turns to a yellowish color and then usually disappears completely over the same period of time. These generally resolve completely without problems. The hematoma rarely requires drainage. HOME CARE INSTRUCTIONS   Apply ice to the injured area for 15 to 20 minutes 3 to 4 times per day for the first 1 or 2 days.   Put the ice in a plastic bag and place a towel between the bag of ice and your skin. Discontinue the ice if it causes pain.   If bleeding is more than just a little, apply pressure to the area for at least thirty minutes to decrease the amount of bruising. Apply pressure and ice as your caregiver suggests.   If the injury is on an extremity, elevation of that part may help to decrease pain and swelling. Wrapping with an ace or supportive wrap may also be helpful. If the bruise is on a lower extremity and is painful, crutches may be helpful for a couple days.   If you have been given a tetanus shot because the skin was broken, your arm may get swollen, red and warm to touch at the shot site. This is a normal response to the medicine in the shot. If you did not receive a tetanus shot today because you did not recall when your last one was given, make sure to check with your caregiver's office and determine if one is needed. Generally for a "dirty" wound, you should receive a tetanus booster if you have not had one in the last five years. If you have a "clean" wound, you should receive a tetanus booster if you have not had one within the last ten years.  SEEK MEDICAL CARE IF:   You have pain not controlled with over the counter  medications. Only take over-the-counter or prescription medicines for pain, discomfort, or fever as directed by your caregiver. Do not use aspirin as it may cause bleeding.   You develop increasing pain or swelling in the area of injury.   You develop any problems which seem worse than the problems which brought you in.  SEEK IMMEDIATE MEDICAL CARE IF:   You have a fever.   You develop severe pain in the area of the bruise out of proportion to the initial injury.   The bruised area becomes red, tender, and swollen.  MAKE SURE YOU:   Understand these instructions.   Will watch your condition.   Will get help right away if you are not doing well or get worse.  Document Released: 06/16/2005 Document Revised: 05/19/2011 Document Reviewed: 04/24/2008 Exit[employer] Patient Information 2012 [employer]. 

## 2011-11-11 NOTE — ED Provider Notes (Signed)
Medical screening examination/treatment/procedure(s) were performed by non-physician practitioner and as supervising physician I was immediately available for consultation/collaboration.  Leslee Home, M.D.   Roque Lias, MD 11/11/11 1021

## 2011-11-12 ENCOUNTER — Encounter: Payer: Self-pay | Admitting: Internal Medicine

## 2011-11-12 LAB — BASIC METABOLIC PANEL
Creatinine: 1.1 mg/dL (ref <=1.3)
Potassium: 3.4 mmol/L (ref 3.4–5.3)

## 2011-11-12 NOTE — Progress Notes (Signed)
Follow-up BMET for his recent AKI was performed at [facility] with data showing resolution of his AKI, Cr at 1.08. I attempted to abstract the results, and report will be scanned.  Johnette Abraham, D.O.

## 2011-11-17 ENCOUNTER — Telehealth: Payer: Self-pay | Admitting: *Deleted

## 2011-11-17 NOTE — Telephone Encounter (Signed)
Call from [place], RN with [facility] stating pt last INR was on 2/18 and he is not scheduled to come back for awhile and she was asking if you would like her to draw an INR on the pt while she is working with him. Her # [phone]   Dr Alexandria Lodge has pt coming back in 4 weeks from last visit 3/18, he usually likes to do his own labs. Please advise

## 2011-11-24 ENCOUNTER — Encounter: Payer: Self-pay | Admitting: Internal Medicine

## 2011-11-24 ENCOUNTER — Ambulatory Visit (INDEPENDENT_AMBULATORY_CARE_PROVIDER_SITE_OTHER): Payer: Medicare Other | Admitting: Internal Medicine

## 2011-11-24 DIAGNOSIS — R4020 Unspecified coma: Secondary | ICD-10-CM | POA: Insufficient documentation

## 2011-11-24 DIAGNOSIS — Z7901 Long term (current) use of anticoagulants: Secondary | ICD-10-CM

## 2011-11-24 DIAGNOSIS — R404 Transient alteration of awareness: Secondary | ICD-10-CM

## 2011-11-24 DIAGNOSIS — Z5181 Encounter for therapeutic drug level monitoring: Secondary | ICD-10-CM

## 2011-11-24 DIAGNOSIS — I10 Essential (primary) hypertension: Secondary | ICD-10-CM

## 2011-11-24 DIAGNOSIS — I2699 Other pulmonary embolism without acute cor pulmonale: Secondary | ICD-10-CM

## 2011-11-24 LAB — BASIC METABOLIC PANEL
BUN: 15 mg/dL (ref 6–23)
Potassium: 4.3 mEq/L (ref 3.5–5.3)
Sodium: 141 mEq/L (ref 135–145)

## 2011-11-24 NOTE — Assessment & Plan Note (Addendum)
Patient is on lifelong anticoagulation with Coumadin for treatment of recurrent pulmonary embolus.  He reports doing well with Coumadin; he has not missed any doses. INR today is just below goal at 1.9.  Will have pt take and extra 5mg  of coumadin today and resume normal dosing tomorrow; he will f/u with Dr. Alexandria Lodge on Monday, March 11.    Daughter present during discussion of 1.9 results.  She asks for all care to be transitioned to [place], as that is where the patient lives.  Will contact [facility] to establish appt for [facility] clinic as well as visit with new PCP.  Will contact pts daughter with appt info.  Patient will still see Dr. Alexandria Lodge on Monday for f/u on INR today.

## 2011-11-24 NOTE — Assessment & Plan Note (Signed)
Patient reports loss of consciousness that occurred approximately one week prior to his office visit today. He cannot recall any details surrounding the event. He was taken to [facility] for evaluation and treatment. Per patient, the physicians did not determine a cause for his loss of consciousness and amnesia.  He has felt well and at his baseline since his hospital visit.  Will request records from [place] today.  Patient is advised to go to the emergency room if he develops any shortness of breath, chest pain condition exertion, dizziness, fever, chills, recurrent amnesia/loss of consciousness.

## 2011-11-24 NOTE — Patient Instructions (Signed)
Please schedule a followup appointment with Dr. Milbert Coulter at her next available date. Please schedule an appointment with Dr. Alexandria Lodge to discuss your coumadin. Keep taking all of your medications as directed.

## 2011-11-24 NOTE — Assessment & Plan Note (Signed)
Blood pressure appears stable. Patient denies any adverse effects from his medications. We'll not make any changes to his antihypertensive regimen at this point. We'll check a basic metabolic panel today.

## 2011-11-24 NOTE — Progress Notes (Signed)
Pt's daughter stated cholesterol med had not been called to the pharmacy since his discharge last month. Lipitor 40mg   Was called to [facility] pharmacy.

## 2011-11-24 NOTE — Progress Notes (Signed)
Patient ID: [name] [name], male   DOB: 1931/03/05, 78 y.o.   MRN: [id number]  HPI: [name] [name] is a 78 y.o. with a PMHx of hypertension, chronic anticoagulation, , who presented to clinic today for the following:  1: Anticoagulation: Patient and a supratherapeutic INR at 8.4 during hospitalization from February 13 to 11/05/2011.  At his hospital followup it was discovered that he had not been in Coumadin clinic for at least 2 months and is having significant difficulty managing his Coumadin at home; missing pills, taking two pills at once, etc. he now has a home health nurse and home health aide that are helping with medication management.  He states he is doing very well and has not missed any doses or had any other difficulties with his Coumadin.  2:  HTN: patient reports compliance with blood pressure medications.  Denies any adverse effect.  3: Cold symptoms: pt reports cold symptoms for 4-5 days.  He describes a dry cough as his primary symptoms.  He denies fever, chills, sore throat, sinus congestion/headache, hemoptysis, or productive cough.  4: Patient reports a visit to [facility] approximately one week prior to his office visit. He states he "lost a day period". He describes being told by his son that he was found down and unresponsive. He states that the workup at [facility] and did not establish a diagnosis.  Patient reports all of the lab tests were normal and that he just "worked too hard."  The patient denies any tongue biting but cannot recall any other details about the event. He states he has felt completely normal since.  He denies chest pain, difficulty breathing, dizziness, headache, visual changes, weakness, mood changes, behavioral changes, fevers, chills, abdominal pain, incontinence, or other complaints.  ROS: Review of Systems  Constitutional: Negative for fever, chills, diaphoresis, activity change, appetite change, fatigue and unexpected weight change.  HENT:  Negative for hearing loss, congestion and neck stiffness.   Eyes: Negative for photophobia, pain and visual disturbance.  Respiratory: Negative for cough, chest tightness, shortness of breath and wheezing.   Cardiovascular: Negative for chest pain and palpitations.  Gastrointestinal: Negative for abdominal pain, blood in stool and anal bleeding.  Genitourinary: Negative for dysuria, hematuria and difficulty urinating.  Musculoskeletal: Negative for joint swelling.  Neurological: Negative for dizziness, syncope, speech difficulty, weakness, numbness and headaches.    Physical exam: VItal signs reviewed and stable. GEN: No apparent distress.  Alert and oriented x 3 but slowed recall/mentation noted. Pleasant, conversant, and cooperative to exam. HEENT: head is autraumatic and normocephalic.  Neck is supple without palpable masses or lymphadenopathy.  No JVD or carotid bruits.  Vision intact.  EOMI.  PERRLA.  Sclerae anicteric.  Conjunctivae without pallor or injection. Mucous membranes are moist.  Oropharynx is without erythema, exudates, or other abnormal lesions.   RESP:  Lungs are clear to ascultation bilaterally with good air movement.  No wheezes, ronchi, or rubs. CARDIOVASCULAR: regular rate, normal rhythm.  Clear S1, S2, no murmurs, gallops, or rubs. ABDOMEN: soft, non-tender, non-distended.  Bowels sounds present in all quadrants and normoactive.  No palpable masses. EXT: warm and dry.  No clubbing or cyanosis. No edema in bilateral lower extremities. SKIN: warm and dry with normal turgor.  No rashes or abnormal lesions observed. NEURO: CN II-XII grossly intact.  Muscle strength +4/5 in bilateral upper and lower extremities.  Sensation is grossly intact.  No focal deficit.

## 2011-11-24 NOTE — Telephone Encounter (Signed)
No need to draw additional INR.  Thanks!

## 2011-11-25 NOTE — Telephone Encounter (Signed)
[facility] informed that INR is being done at clinic. Nurse wanted you to be aware pt is still driving.  She is not sure if this is safe. FYI

## 2011-11-26 NOTE — Telephone Encounter (Signed)
I see in his visit note from 11/24/11 that he was recently hospitalized for loss of consciousness at [facility].  He should be advised to stop driving until we receive records to understand what the cause of his LOC was.   Thanks!

## 2011-11-29 ENCOUNTER — Ambulatory Visit (INDEPENDENT_AMBULATORY_CARE_PROVIDER_SITE_OTHER): Payer: Medicare Other | Admitting: Pharmacist

## 2011-11-29 DIAGNOSIS — Z7902 Long term (current) use of antithrombotics/antiplatelets: Secondary | ICD-10-CM

## 2011-11-29 DIAGNOSIS — Z7901 Long term (current) use of anticoagulants: Secondary | ICD-10-CM

## 2011-11-29 DIAGNOSIS — I2699 Other pulmonary embolism without acute cor pulmonale: Secondary | ICD-10-CM

## 2011-11-29 LAB — POCT INR: INR: 2.9

## 2011-11-29 NOTE — Patient Instructions (Signed)
Patient instructed to take medications as defined in the Anti-coagulation Track section of this encounter.  Patient instructed to take today's dose.  Patient verbalized understanding of these instructions.    

## 2011-11-29 NOTE — Progress Notes (Signed)
Agree with Dr. Groce's assessment and management plan. 

## 2011-11-29 NOTE — Progress Notes (Signed)
Anti-Coagulation Progress Note  Jabron Weese is a 78 y.o. male who is currently on an anti-coagulation regimen.    RECENT RESULTS: Recent results are below, the most recent result is correlated with a dose of 50 mg. per week: Lab Results  Component Value Date   INR 2.90 11/29/2011   INR 2.80 11/08/2011   INR 2.28* 11/05/2011    ANTI-COAG DOSE:   Latest dosing instructions   Total Sun Mon Tue Wed Thu Fri Sat   45 7.5 mg 7.5 mg 5 mg 5 mg 7.5 mg 5 mg 7.5 mg    (5 mg1.5) (5 mg1.5) (5 mg1) (5 mg1) (5 mg1.5) (5 mg1) (5 mg1.5)         ANTICOAG SUMMARY: Anticoagulation Episode Summary              Current INR goal 2.0-3.0 Next INR check 12/20/2011   INR from last check 2.90 (11/29/2011)     Weekly max dose (mg)  Target end date Indefinite   Indications Pulmonary embolism, Encounter for long-term use of antiplatelets/antithrombotics   INR check location [facility] Preferred lab    Send INR reminders to ANTICOAG [facility]   Comments History of multiple venous embolic episodes. Will continue to annual re-evaluate continued need for warfarin weighing risks vs. benefits.        Provider Role Specialty Phone number   Farley Ly, MD  Internal Medicine [phone]        ANTICOAG TODAY: Anticoagulation Summary as of 11/29/2011              INR goal 2.0-3.0     Selected INR 2.90 (11/29/2011) Next INR check 12/20/2011   Weekly max dose (mg)  Target end date Indefinite   Indications Pulmonary embolism, Encounter for long-term use of antiplatelets/antithrombotics    Anticoagulation Episode Summary              INR check location [facility] Preferred lab    Send INR reminders to ANTICOAG [facility]   Comments History of multiple venous embolic episodes. Will continue to annual re-evaluate continued need for warfarin weighing risks vs. benefits.        Provider Role Specialty Phone number   Farley Ly, MD  Internal Medicine [phone]        PATIENT  INSTRUCTIONS: Patient Instructions  Patient instructed to take medications as defined in the Anti-coagulation Track section of this encounter.  Patient instructed to take today's dose.  Patient verbalized understanding of these instructions.        FOLLOW-UP Return in 3 weeks (on 12/20/2011) for Follow up INR.  [name], [name] Pharm.D., CACP

## 2011-11-30 NOTE — Telephone Encounter (Signed)
Pt scheduled for tomorrow.  We called for hospital records.

## 2011-12-01 ENCOUNTER — Ambulatory Visit (INDEPENDENT_AMBULATORY_CARE_PROVIDER_SITE_OTHER): Payer: Medicare Other | Admitting: Internal Medicine

## 2011-12-01 ENCOUNTER — Encounter: Payer: Self-pay | Admitting: Internal Medicine

## 2011-12-01 VITALS — BP 120/78 | HR 67 | Temp 97.8°F | Ht 68.0 in | Wt 170.2 lb

## 2011-12-01 DIAGNOSIS — G3184 Mild cognitive impairment, so stated: Secondary | ICD-10-CM

## 2011-12-01 NOTE — Progress Notes (Signed)
Subjective:     Patient ID: [name] [name], male   DOB: 1931/03/05, 78 y.o.   MRN: [id number]  HPI  Patient is a 78 year old male with a past medical history listed below, presents to the outpatient clinic for evaluation of his dementia, and whether it is safe for him to continue driving. Upon entering the patient Mini-Mental exam was performed, and it clinical dementia rating assessed. Patient's otherwise compliant with his medications, denies any other complaints today.  Review of Systems  All other systems reviewed and are negative.       Objective:   Physical Exam  Constitutional: He is oriented to person, place, and time. He appears well-developed and well-nourished.  HENT:  Head: Normocephalic and atraumatic.  Eyes: Pupils are equal, round, and reactive to light.  Neck: Normal range of motion. No JVD present. No thyromegaly present.  Cardiovascular: Normal rate, regular rhythm and normal heart sounds.   Pulmonary/Chest: Effort normal and breath sounds normal. He has no wheezes. He has no rales.  Abdominal: Soft. Bowel sounds are normal. There is no tenderness. There is no rebound.  Musculoskeletal: Normal range of motion. He exhibits no edema.  Neurological: He is alert and oriented to person, place, and time.  Skin: Skin is warm and dry.    Patient Active Problem List  Diagnoses  . HYPERLIPIDEMIA  . Essential hypertension, benign  . GERD  . BACK PAIN, CHRONIC, INTERMITTENT  . ADENOCARCINOMA, PROSTATE, HX OF  . Pulmonary embolism  . Encounter for long-term use of antiplatelets/antithrombotics  . MCI (mild cognitive impairment)  . Acute kidney injury  . Loss of consciousness     Current Outpatient Prescriptions on File Prior to Visit  Medication Sig Dispense Refill  . ALPRAZolam (XANAX) 0.5 MG tablet Take 0.5 mg by mouth 2 (two) times daily as needed. For anxiety      . amitriptyline (ELAVIL) 100 MG tablet Take 100 mg by mouth at bedtime.       [name] amLODipine (NORVASC)  10 MG tablet Take 10 mg by mouth daily.      [name] aspirin 81 MG tablet Take 81 mg by mouth daily.        [name] atorvastatin (LIPITOR) 40 MG tablet Take 1 tablet (40 mg total) by mouth daily.  30 tablet  1  . Homeopathic Products (ZICAM COLD REMEDY PO) Take 2 tablets by mouth daily.      . hydrochlorothiazide (MICROZIDE) 12.5 MG capsule Take 12.5 mg by mouth daily.      [name] ibuprofen (ADVIL,MOTRIN) 200 MG tablet Take 800 mg by mouth every 8 (eight) hours as needed. For pain.      [name] lisinopril (PRINIVIL,ZESTRIL) 40 MG tablet Take 40 mg by mouth daily.      [name] omeprazole (PRILOSEC) 20 MG capsule Take 20 mg by mouth daily.        [name] warfarin (COUMADIN) 5 MG tablet Take 1.5 tablets (7.5 mg total) by mouth daily.  45 tablet  1    Allergies  Allergen Reactions  . Other     Shrimp

## 2011-12-01 NOTE — Assessment & Plan Note (Addendum)
Patient had an admission in the past with altered mental status that was thought to be secondary to benzodiazepines and amitriptyline. Is here to evaluate whether he should continue driving and if it is safe to continue doing so. Mini-Mental exam performed today patient in a score of 28, missing only the date today and failed to recall one object out of 3. His clinical dementia score is 0 at this time. Given this assessment, and the fact that the patient only uses his car to drive local distances, and after discussion with the family I feel that it is reasonable and safe for the patient to continue driving however this should be reassessed on a yearly basis to evaluate the progression of his dementia. Advised the patient to drive only short distances, in the daytime only, and to not drive if he is not feeling well.

## 2011-12-01 NOTE — Patient Instructions (Addendum)
Drive safely, and only drive short distances, in the daytime only, and to not drive if you are not feeling well.

## 2011-12-06 ENCOUNTER — Ambulatory Visit: Payer: Medicare Other

## 2011-12-06 ENCOUNTER — Encounter: Payer: Medicare Other | Admitting: Internal Medicine

## 2011-12-15 ENCOUNTER — Encounter: Payer: Medicare Other | Admitting: Internal Medicine

## 2011-12-27 ENCOUNTER — Ambulatory Visit (INDEPENDENT_AMBULATORY_CARE_PROVIDER_SITE_OTHER): Payer: Medicare Other | Admitting: Pharmacist

## 2011-12-27 DIAGNOSIS — I2699 Other pulmonary embolism without acute cor pulmonale: Secondary | ICD-10-CM

## 2011-12-27 DIAGNOSIS — Z7902 Long term (current) use of antithrombotics/antiplatelets: Secondary | ICD-10-CM

## 2011-12-27 DIAGNOSIS — Z7901 Long term (current) use of anticoagulants: Secondary | ICD-10-CM

## 2011-12-27 LAB — POCT INR: INR: 3.4

## 2011-12-27 NOTE — Patient Instructions (Signed)
Patient instructed to take medications as defined in the Anti-coagulation Track section of this encounter.  Patient instructed to take today's dose.  Patient verbalized understanding of these instructions.    

## 2011-12-27 NOTE — Progress Notes (Signed)
Anti-Coagulation Progress Note  [name] [name] is a 78 y.o. male who is currently on an anti-coagulation regimen.    RECENT RESULTS: Recent results are below, the most recent result is correlated with a dose of 45 mg. per week: Lab Results  Component Value Date   INR 3.40 12/27/2011   INR 2.90 11/29/2011   INR 2.80 11/08/2011    ANTI-COAG DOSE:   Latest dosing instructions   Total Sun Mon Tue Wed Thu Fri Sat   42.5 5 mg 7.5 mg 5 mg 7.5 mg 5 mg 7.5 mg 5 mg    (5 mg1) (5 mg1.5) (5 mg1) (5 mg1.5) (5 mg1) (5 mg1.5) (5 mg1)         ANTICOAG SUMMARY: Anticoagulation Episode Summary              Current INR goal 2.0-3.0 Next INR check 01/24/2012   INR from last check 3.40! (12/27/2011)     Weekly max dose (mg)  Target end date Indefinite   Indications Pulmonary embolism, Encounter for long-term use of antiplatelets/antithrombotics   INR check location [facility] Preferred lab    Send INR reminders to [facility]   Comments History of multiple venous embolic episodes. Will continue to annual re-evaluate continued need for warfarin weighing risks vs. benefits.        Provider Role Specialty Phone number   Farley Ly, MD  Internal Medicine [phone]        ANTICOAG TODAY: Anticoagulation Summary as of 12/27/2011              INR goal 2.0-3.0     Selected INR 3.40! (12/27/2011) Next INR check 01/24/2012   Weekly max dose (mg)  Target end date Indefinite   Indications Pulmonary embolism, Encounter for long-term use of antiplatelets/antithrombotics    Anticoagulation Episode Summary              INR check location [facility] Preferred lab    Send INR reminders to [facility]   Comments History of multiple venous embolic episodes. Will continue to annual re-evaluate continued need for warfarin weighing risks vs. benefits.        Provider Role Specialty Phone number   Farley Ly, MD  Internal Medicine [phone]        PATIENT  INSTRUCTIONS: Patient Instructions  Patient instructed to take medications as defined in the Anti-coagulation Track section of this encounter.  Patient instructed to take today's dose.  Patient verbalized understanding of these instructions.        FOLLOW-UP Return in 4 weeks (on 01/24/2012) for Follow up INR.  [name], [name] Pharm.D., CACP

## 2012-01-20 ENCOUNTER — Encounter: Payer: Self-pay | Admitting: Internal Medicine

## 2012-01-20 ENCOUNTER — Ambulatory Visit (INDEPENDENT_AMBULATORY_CARE_PROVIDER_SITE_OTHER): Payer: Medicare Other | Admitting: Internal Medicine

## 2012-01-20 VITALS — BP 105/74 | HR 67 | Temp 97.1°F | Wt 172.1 lb

## 2012-01-20 DIAGNOSIS — Z23 Encounter for immunization: Secondary | ICD-10-CM

## 2012-01-20 DIAGNOSIS — R4020 Unspecified coma: Secondary | ICD-10-CM

## 2012-01-20 DIAGNOSIS — N179 Acute kidney failure, unspecified: Secondary | ICD-10-CM

## 2012-01-20 DIAGNOSIS — K5900 Constipation, unspecified: Secondary | ICD-10-CM

## 2012-01-20 DIAGNOSIS — K219 Gastro-esophageal reflux disease without esophagitis: Secondary | ICD-10-CM

## 2012-01-20 DIAGNOSIS — E785 Hyperlipidemia, unspecified: Secondary | ICD-10-CM

## 2012-01-20 DIAGNOSIS — I2699 Other pulmonary embolism without acute cor pulmonale: Secondary | ICD-10-CM

## 2012-01-20 DIAGNOSIS — R404 Transient alteration of awareness: Secondary | ICD-10-CM

## 2012-01-20 DIAGNOSIS — Z8546 Personal history of malignant neoplasm of prostate: Secondary | ICD-10-CM

## 2012-01-20 DIAGNOSIS — G3184 Mild cognitive impairment, so stated: Secondary | ICD-10-CM

## 2012-01-20 DIAGNOSIS — M549 Dorsalgia, unspecified: Secondary | ICD-10-CM

## 2012-01-20 DIAGNOSIS — G4700 Insomnia, unspecified: Secondary | ICD-10-CM | POA: Insufficient documentation

## 2012-01-20 DIAGNOSIS — I10 Essential (primary) hypertension: Secondary | ICD-10-CM

## 2012-01-20 MED ORDER — ZOSTER VACCINE LIVE 19400 UNT/0.65ML ~~LOC~~ SOLR: 0.6500 mL | Freq: Once | SUBCUTANEOUS | Status: AC

## 2012-01-20 NOTE — Progress Notes (Signed)
Subjective:   Patient ID: [name] [name] male   DOB: [birth year] 78 y.o.   MRN: [id number]  HPI: [name] [name] is a 78 y.o. man with past medical history significant for hypertension, CVA, PE on chronic anticoagulation presents for routine followup.  The only complaint he has today is of constipation.  He treats constipation with milk of magnesia.  He generally has 1 normal bowel movement per day.  This is a long-standing problem.  He also notes joint pain.  Review of imaging reveals that he does have arthritis.  Ibuprofen manages his pain well per patient.    Denies dizziness, chest pain, headache, diaphoresis, shortness of breath, confusion, and loss of consciousness recently.    He tells me that he does not take amitriptyline anymore and thus we will discontinue it today.   He uses Xanax to sleep.   Current Outpatient Prescriptions  Medication Sig Dispense Refill  . ALPRAZolam (XANAX) 0.5 MG tablet Take 0.5 mg by mouth 2 (two) times daily as needed. For anxiety      . amLODipine (NORVASC) 10 MG tablet Take 10 mg by mouth daily.      [name] aspirin 81 MG tablet Take 81 mg by mouth daily.        [name] atorvastatin (LIPITOR) 40 MG tablet Take 1 tablet (40 mg total) by mouth daily.  30 tablet  1  . hydrochlorothiazide (MICROZIDE) 12.5 MG capsule Take 12.5 mg by mouth daily.      [name] ibuprofen (ADVIL,MOTRIN) 200 MG tablet Take 800 mg by mouth every 8 (eight) hours as needed. For pain.      [name] lisinopril (PRINIVIL,ZESTRIL) 40 MG tablet Take 40 mg by mouth daily.      [name] omeprazole (PRILOSEC) 20 MG capsule Take 20 mg by mouth daily.        [name] warfarin (COUMADIN) 5 MG tablet Take 1.5 tablets (7.5 mg total) by mouth daily.  45 tablet  1  . zoster vaccine live, PF, (ZOSTAVAX) 40981 UNT/0.65ML injection Inject 19,400 Units into the skin once.  1 each  0   Review of Systems: Constitutional: Denies fever, chills, diaphoresis, appetite change and fatigue.  HEENT: Denies photophobia, eye pain, redness,  hearing loss, ear pain, congestion, sore throat, rhinorrhea, sneezing, mouth sores, trouble swallowing, neck pain, neck stiffness and tinnitus.   Respiratory: Denies SOB, DOE, cough, chest tightness,  and wheezing.   Cardiovascular: Denies chest pain, palpitations and leg swelling.  Gastrointestinal: Denies nausea, vomiting, abdominal pain, diarrhea, blood in stool and abdominal distention.  Genitourinary: Denies dysuria, urgency, frequency, hematuria, flank pain  Musculoskeletal: Denies myalgias and gait problem.   Neurological: Denies dizziness, seizures, syncope, weakness, light-headedness, numbness and headaches.  Psychiatric/Behavioral: Denies suicidal ideation, mood changes, confusion, nervousness, and agitation  Objective:  Physical Exam: Filed Vitals:   01/20/12 0922  BP: 105/74  Pulse: 67  Temp: 97.1 F (36.2 C)  TempSrc: Oral  Weight: 172 lb 1.6 oz (78.064 kg)   Constitutional: Vital signs reviewed.  Patient is a well-developed and well-nourished man in no acute distress and cooperative with exam. Mouth: no erythema or exudates, MMM Eyes: PERRL, EOMI, conjunctivae normal, No scleral icterus.  Neck: Supple, Trachea midline normal ROM, No thyromegaly Cardiovascular: RRR, S1 normal, loud S2, no MRG, pulses symmetric and intact bilaterally Pulmonary/Chest: CTAB, no wheezes, rales, or rhonchi Abdominal: Soft. Non-tender, non-distended, bowel sounds are normal Musculoskeletal: Stiffness of bilateral wrists and fingers  Neurological: A&O x3, Strength is normal and symmetric bilaterally, cranial nerve II-XII are  grossly intact, no focal motor deficit, sensory intact to light touch bilaterally.  Psychiatric: Normal mood and affect. speech and behavior is normal. Judgment and thought content normal. Cognition and memory are normal.   Assessment & Plan:  Case and care discussed with Dr. Rogelia Boga.  He should to return in 3 months for blood pressure check. He will also need lipid panel  redrawn. Please see problem-oriented charting for further details.

## 2012-01-20 NOTE — Assessment & Plan Note (Signed)
Mini-Mental exam done in March 2013 by Dr. Gilford Rile, on which patient scored a 28.  At that time he missed the date and fail to recall one out of 3 objects.  Today he knows the date, is oriented to person, place, Pres., and birthday.  He should have full Mini-Mental exam on an annual basis.

## 2012-01-20 NOTE — Assessment & Plan Note (Signed)
Lab Results  Component Value Date   NA 141 11/24/2011   NA 140 11/12/2011   K 4.3 11/24/2011   CL 105 11/24/2011   CO2 28 11/24/2011   BUN 15 11/24/2011   BUN 17 11/12/2011   CREATININE 1.01 11/24/2011   CREATININE 1.1 11/12/2011   CREATININE 1.62* 11/05/2011    BP Readings from Last 3 Encounters:  01/20/12 105/74  12/01/11 120/78  11/24/11 144/93    Assessment: Hypertension control:  controlled  Progress toward goals:  at goal Barriers to meeting goals:  no barriers identified  Plan: Hypertension treatment:  continue current medications, which includes Norvasc, lisinopril, and hydrochlorothiazide.  Patient does have home health aid to help with medication, his blood pressure continues to remain in the low 100s we may consider decreasing or discontinuing Norvasc; chart review does suggest history of old CVA.

## 2012-01-20 NOTE — Assessment & Plan Note (Signed)
Loss of consciousness in early March was thought to be secondary to accidentally ingesting too much amitriptyline.  Since, patient has been afraid to take amitriptyline in the past amitriptyline was discontinued at today's visit.  Amitriptyline was used to help patient sleep for which he now uses Xanax only. Patient does have home health aide to help take medications at home.

## 2012-01-20 NOTE — Assessment & Plan Note (Signed)
Patient continues to take protonix as needed for heartburn.

## 2012-01-20 NOTE — Patient Instructions (Addendum)
-You can go to your pharmacy to get the Zoster Vaccine, there is information about it below.  -Increase the fiber in your diet, this will help with your bowel movements.  -Please be sure to bring all of your medications with you to every visit.  -Be sure to continue to follow up with Dr. Alexandria Lodge to check how thin your blood is.  -Should you have any new or worsening symptoms, please be sure to call the clinic at [phone].   High Fiber Diet A high fiber diet changes your normal diet to include more whole grains, legumes, fruits, and vegetables. Changes in the diet involve replacing refined carbohydrates with unrefined foods. The calorie level of the diet is essentially unchanged. The Dietary Reference Intake (recommended amount) for adult males is 38 g per day. For adult females, it is 25 g per day. Pregnant and lactating women should consume 28 g of fiber per day. Fiber is the intact part of a plant that is not broken down during digestion. Functional fiber is fiber that has been isolated from the plant to provide a beneficial effect in the body. PURPOSE  Increase stool bulk.   Ease and regulate bowel movements.   Lower cholesterol.  INDICATIONS THAT YOU NEED MORE FIBER  Constipation and hemorrhoids.   Uncomplicated diverticulosis (intestine condition) and irritable bowel syndrome.   Weight management.   As a protective measure against hardening of the arteries (atherosclerosis), diabetes, and cancer.  NOTE OF CAUTION If you have a digestive or bowel problem, ask your caregiver for advice before adding high fiber foods to your diet. Some of the following medical problems are such that a high fiber diet should not be used without consulting your caregiver:  Acute diverticulitis (intestine infection).   Partial small bowel obstructions.   Complicated diverticular disease involving bleeding, rupture (perforation), or abscess (boil, furuncle).   Presence of autonomic neuropathy  (nerve damage) or gastric paresis (stomach cannot empty itself).  GUIDELINES FOR INCREASING FIBER  Start adding fiber to the diet slowly. A gradual increase of about 5 more grams (2 slices of whole-wheat bread, 2 servings of most fruits or vegetables, or 1 bowl of high fiber cereal) per day is best. Too rapid an increase in fiber may result in constipation, flatulence, and bloating.   Drink enough water and fluids to keep your urine clear or pale yellow. Water, juice, or caffeine-free drinks are recommended. Not drinking enough fluid may cause constipation.   Eat a variety of high fiber foods rather than one type of fiber.   Try to increase your intake of fiber through using high fiber foods rather than fiber pills or supplements that contain small amounts of fiber.   The goal is to change the types of food eaten. Do not supplement your present diet with high fiber foods, but replace foods in your present diet.  INCLUDE A VARIETY OF FIBER SOURCES  Replace refined and processed grains with whole grains, canned fruits with fresh fruits, and incorporate other fiber sources. White rice, white breads, and most bakery goods contain little or no fiber.   Brown whole-grain rice, buckwheat oats, and many fruits and vegetables are all good sources of fiber. These include: broccoli, Brussels sprouts, cabbage, cauliflower, beets, sweet potatoes, white potatoes (skin on), carrots, tomatoes, eggplant, squash, berries, fresh fruits, and dried fruits.   Cereals appear to be the richest source of fiber. Cereal fiber is found in whole grains and bran. Bran is the fiber-rich outer coat of cereal  grain, which is largely removed in refining. In whole-grain cereals, the bran remains. In breakfast cereals, the largest amount of fiber is found in those with "bran" in their names. The fiber content is sometimes indicated on the label.   You may need to include additional fruits and vegetables each day.   In baking, for  1 cup white flour, you may use the following substitutions:   1 cup whole-wheat flour minus 2 tbs.    cup white flour plus  cup whole-wheat flour.  Document Released: 09/06/2005 Document Revised: 08/26/2011 Document Reviewed: 07/15/2009 [employer] Patient Information 2012 [place], [place].

## 2012-01-20 NOTE — Assessment & Plan Note (Signed)
LDL was elevated in February 2013. Since then patient has started statin.  Will draw lipid panel at next visit.

## 2012-01-20 NOTE — Assessment & Plan Note (Signed)
Advised to increase fiber in diet.  Continue milk of magnesia as needed.

## 2012-01-20 NOTE — Assessment & Plan Note (Signed)
Likely related to age. Treated with amitriptyline in the past, but patient does not feel safe using this medication at this time.  Continues to take Xanax to help him sleep, which I am okay with it and it is a short acting medication in an elderly gentleman.

## 2012-01-20 NOTE — Assessment & Plan Note (Signed)
Patient on lifelong anticoagulation with Coumadin for recurrent pulmonary embolus.  He has a home health aide to help with Coumadin at home. He is followed by Dr. Michaell Cowing.  Last note suggested that patient wanted to transition care closer to home which is in [place], but today he reports that he would like to continue attending our clinic

## 2012-01-20 NOTE — Assessment & Plan Note (Addendum)
Patient continues to take ibuprofen as needed for degenerative joint disease.  He is happy with this regimen.  No change in therapy.

## 2012-01-20 NOTE — Assessment & Plan Note (Signed)
bmet one month prior revealed normal creatinine

## 2012-01-24 ENCOUNTER — Ambulatory Visit (INDEPENDENT_AMBULATORY_CARE_PROVIDER_SITE_OTHER): Payer: Medicare Other | Admitting: Pharmacist

## 2012-01-24 DIAGNOSIS — Z7902 Long term (current) use of antithrombotics/antiplatelets: Secondary | ICD-10-CM

## 2012-01-24 DIAGNOSIS — I2699 Other pulmonary embolism without acute cor pulmonale: Secondary | ICD-10-CM

## 2012-01-24 DIAGNOSIS — Z7901 Long term (current) use of anticoagulants: Secondary | ICD-10-CM

## 2012-01-24 LAB — POCT INR: INR: 2.7

## 2012-01-24 NOTE — Progress Notes (Signed)
Anti-Coagulation Progress Note  [name] [name] is a 78 y.o. male who is currently on an anti-coagulation regimen.    RECENT RESULTS: Recent results are below, the most recent result is correlated with a dose of 42.5 mg. per week: Lab Results  Component Value Date   INR 2.70 01/24/2012   INR 3.40 12/27/2011   INR 2.90 11/29/2011    ANTI-COAG DOSE:   Latest dosing instructions   Total Sun Mon Tue Wed Thu Fri Sat   42.5 5 mg 7.5 mg 5 mg 7.5 mg 5 mg 7.5 mg 5 mg    (5 mg1) (5 mg1.5) (5 mg1) (5 mg1.5) (5 mg1) (5 mg1.5) (5 mg1)         ANTICOAG SUMMARY: Anticoagulation Episode Summary              Current INR goal 2.0-3.0 Next INR check 02/21/2012   INR from last check 2.70 (01/24/2012)     Weekly max dose (mg)  Target end date Indefinite   Indications Pulmonary embolism, Encounter for long-term use of antiplatelets/antithrombotics   [facility] check location [facility]    Send INR reminders to [facility]   Comments History of multiple venous embolic episodes. Will continue to annual re-evaluate continued need for warfarin weighing risks vs. benefits.        Provider Role Specialty Phone number   Farley Ly, MD  Internal Medicine [phone]        ANTICOAG TODAY: Anticoagulation Summary as of 01/24/2012              INR goal 2.0-3.0     Selected INR 2.70 (01/24/2012) Next INR check 02/21/2012   Weekly max dose (mg)  Target end date Indefinite   Indications Pulmonary embolism, Encounter for long-term use of antiplatelets/antithrombotics    Anticoagulation Episode Summary              INR check location [facility]    Send INR reminders to [facility]   Comments History of multiple venous embolic episodes. Will continue to annual re-evaluate continued need for warfarin weighing risks vs. benefits.        Provider Role Specialty Phone number   Farley Ly, MD  Internal Medicine [phone]        PATIENT  INSTRUCTIONS: Patient Instructions  Patient instructed to take medications as defined in the Anti-coagulation Track section of this encounter.  Patient instructed to take today's dose.  Patient verbalized understanding of these instructions.        FOLLOW-UP Return in 4 weeks (on 02/21/2012) for Follow up INR.  [name], [name] Pharm.D., CACP

## 2012-01-24 NOTE — Patient Instructions (Signed)
Patient instructed to take medications as defined in the Anti-coagulation Track section of this encounter.  Patient instructed to take today's dose.  Patient verbalized understanding of these instructions.    

## 2012-02-04 ENCOUNTER — Other Ambulatory Visit: Payer: Self-pay | Admitting: *Deleted

## 2012-02-04 MED ORDER — ATORVASTATIN CALCIUM 40 MG PO TABS: 40.0000 mg | ORAL_TABLET | Freq: Every day | ORAL | Status: DC

## 2012-02-04 NOTE — Telephone Encounter (Signed)
Requesting 90 day supply.

## 2012-02-08 ENCOUNTER — Other Ambulatory Visit: Payer: Self-pay | Admitting: *Deleted

## 2012-02-08 MED ORDER — LISINOPRIL 40 MG PO TABS: 40.0000 mg | ORAL_TABLET | Freq: Every day | ORAL | Status: DC

## 2012-02-16 ENCOUNTER — Other Ambulatory Visit: Payer: Self-pay | Admitting: *Deleted

## 2012-02-16 MED ORDER — WARFARIN SODIUM 5 MG PO TABS: ORAL_TABLET | ORAL | Status: DC

## 2012-02-16 NOTE — Telephone Encounter (Signed)
Rx called to pharmacy per Dr Aundria Rud. Stanton Kidney Kiva Norland RN 02/15/12 2:15PM

## 2012-02-17 ENCOUNTER — Other Ambulatory Visit: Payer: Self-pay | Admitting: Internal Medicine

## 2012-02-17 MED ORDER — WARFARIN SODIUM 5 MG PO TABS: ORAL_TABLET | ORAL | Status: DC

## 2012-02-21 ENCOUNTER — Ambulatory Visit (INDEPENDENT_AMBULATORY_CARE_PROVIDER_SITE_OTHER): Payer: Medicare Other | Admitting: Pharmacist

## 2012-02-21 ENCOUNTER — Other Ambulatory Visit: Payer: Self-pay | Admitting: *Deleted

## 2012-02-21 DIAGNOSIS — Z7901 Long term (current) use of anticoagulants: Secondary | ICD-10-CM

## 2012-02-21 DIAGNOSIS — Z7902 Long term (current) use of antithrombotics/antiplatelets: Secondary | ICD-10-CM

## 2012-02-21 DIAGNOSIS — I2699 Other pulmonary embolism without acute cor pulmonale: Secondary | ICD-10-CM

## 2012-02-21 LAB — POCT INR: INR: 2.9

## 2012-02-21 MED ORDER — OMEPRAZOLE 20 MG PO CPDR: 20.0000 mg | DELAYED_RELEASE_CAPSULE | Freq: Every day | ORAL | Status: DC

## 2012-02-21 NOTE — Progress Notes (Signed)
Anti-Coagulation Progress Note  Kingston Shawgo is a 78 y.o. male who is currently on an anti-coagulation regimen.    RECENT RESULTS: Recent results are below, the most recent result is correlated with a dose of 42.5 mg. per week: Lab Results  Component Value Date   INR 2.90 02/21/2012   INR 2.70 01/24/2012   INR 3.40 12/27/2011    ANTI-COAG DOSE:   Latest dosing instructions   Total Sun Mon Tue Wed Thu Fri Sat   40 5 mg 5 mg 7.5 mg 5 mg 7.5 mg 5 mg 5 mg    (5 mg1) (5 mg1) (5 mg1.5) (5 mg1) (5 mg1.5) (5 mg1) (5 mg1)         ANTICOAG SUMMARY: Anticoagulation Episode Summary              Current INR goal 2.0-3.0 Next INR check 03/20/2012   INR from last check 2.90 (02/21/2012)     Weekly max dose (mg)  Target end date Indefinite   Indications Pulmonary embolism, Encounter for long-term use of antiplatelets/antithrombotics   [facility] check location [facility]    Send INR reminders to [facility]   Comments History of multiple venous embolic episodes. Will continue to annual re-evaluate continued need for warfarin weighing risks vs. benefits.        Provider Role Specialty Phone number   Farley Ly, MD  Internal Medicine [phone]        ANTICOAG TODAY: Anticoagulation Summary as of 02/21/2012              INR goal 2.0-3.0     Selected INR 2.90 (02/21/2012) Next INR check 03/20/2012   Weekly max dose (mg)  Target end date Indefinite   Indications Pulmonary embolism, Encounter for long-term use of antiplatelets/antithrombotics    Anticoagulation Episode Summary              INR check location [facility]    Send INR reminders to [facility]   Comments History of multiple venous embolic episodes. Will continue to annual re-evaluate continued need for warfarin weighing risks vs. benefits.        Provider Role Specialty Phone number   Farley Ly, MD  Internal Medicine [phone]        PATIENT INSTRUCTIONS: Patient  Instructions  Patient instructed to take medications as defined in the Anti-coagulation Track section of this encounter.  Patient instructed to take today's dose.  Patient verbalized understanding of these instructions.       FOLLOW-UP Return in 4 weeks (on 03/20/2012) for Follow up INR at 1100H.  [name], [name] Pharm.D., CACP

## 2012-02-21 NOTE — Patient Instructions (Signed)
Patient instructed to take medications as defined in the Anti-coagulation Track section of this encounter.  Patient instructed to take today's dose.  Patient verbalized understanding of these instructions.    

## 2012-02-24 ENCOUNTER — Telehealth: Payer: Self-pay | Admitting: *Deleted

## 2012-02-24 NOTE — Telephone Encounter (Signed)
I talked with nurse at Dr Donnelly Angelica office [name] ) to clarify why Dr Benjaman Lobe was refilling pt's coumadin. Nurse states they were called by pt's daughter on 5/29 stating pt was out of coumadin and they couldn't get in touch with [name] So Dr Benjaman Lobe refilled once so pt would not be without coumadin.  They are aware that we manage pt's anti-coagulation care.

## 2012-03-20 ENCOUNTER — Ambulatory Visit (INDEPENDENT_AMBULATORY_CARE_PROVIDER_SITE_OTHER): Payer: Medicare Other | Admitting: Pharmacist

## 2012-03-20 DIAGNOSIS — Z7902 Long term (current) use of antithrombotics/antiplatelets: Secondary | ICD-10-CM

## 2012-03-20 DIAGNOSIS — Z7901 Long term (current) use of anticoagulants: Secondary | ICD-10-CM

## 2012-03-20 DIAGNOSIS — I2699 Other pulmonary embolism without acute cor pulmonale: Secondary | ICD-10-CM

## 2012-03-20 NOTE — Patient Instructions (Signed)
Patient instructed to take medications as defined in the Anti-coagulation Track section of this encounter.  Patient instructed to take today's dose.  Patient verbalized understanding of these instructions.    

## 2012-03-20 NOTE — Progress Notes (Signed)
Anti-Coagulation Progress Note  [name] [name] is a 78 y.o. male who is currently on an anti-coagulation regimen.    RECENT RESULTS: Recent results are below, the most recent result is correlated with a dose of 40 mg. per week: Lab Results  Component Value Date   INR 2.30 03/20/2012   INR 2.90 02/21/2012   INR 2.70 01/24/2012    ANTI-COAG DOSE:   Latest dosing instructions   Total Sun Mon Tue Wed Thu Fri Sat   42.5 5 mg 7.5 mg 5 mg 7.5 mg 5 mg 7.5 mg 5 mg    (5 mg1) (5 mg1.5) (5 mg1) (5 mg1.5) (5 mg1) (5 mg1.5) (5 mg1)         ANTICOAG SUMMARY: Anticoagulation Episode Summary              Current INR goal 2.0-3.0 Next INR check 04/10/2012   INR from last check 2.30 (03/20/2012)     Weekly max dose (mg)  Target end date Indefinite   Indications Pulmonary embolism, Encounter for long-term use of antiplatelets/antithrombotics   INR check location [facility]    Send INR reminders to [facility]   Comments History of multiple venous embolic episodes. Will continue to annual re-evaluate continued need for warfarin weighing risks vs. benefits.        Provider Role Specialty Phone number   Farley Ly, MD  Internal Medicine [phone]        ANTICOAG TODAY: Anticoagulation Summary as of 03/20/2012              INR goal 2.0-3.0     Selected INR 2.30 (03/20/2012) Next INR check 04/10/2012   Weekly max dose (mg)  Target end date Indefinite   Indications Pulmonary embolism, Encounter for long-term use of antiplatelets/antithrombotics    Anticoagulation Episode Summary              INR check location [facility]    Send INR reminders to [facility]   Comments History of multiple venous embolic episodes. Will continue to annual re-evaluate continued need for warfarin weighing risks vs. benefits.        Provider Role Specialty Phone number   Farley Ly, MD  Internal Medicine [phone]        PATIENT INSTRUCTIONS: Patient  Instructions  Patient instructed to take medications as defined in the Anti-coagulation Track section of this encounter.  Patient instructed to take today's dose.  Patient verbalized understanding of these instructions.        FOLLOW-UP Return in 3 weeks (on 04/10/2012) for Follow up INR at 1130h.  [name], [name] Pharm.D., CACP

## 2012-04-09 ENCOUNTER — Encounter (HOSPITAL_COMMUNITY): Payer: Self-pay | Admitting: *Deleted

## 2012-04-09 ENCOUNTER — Emergency Department (HOSPITAL_COMMUNITY): Payer: Medicare Other

## 2012-04-09 ENCOUNTER — Emergency Department (HOSPITAL_COMMUNITY)
Admission: EM | Admit: 2012-04-09 | Discharge: 2012-04-09 | Disposition: A | Discharge status: Home / Self Care | Payer: Medicare Other | Attending: Emergency Medicine | Admitting: Emergency Medicine

## 2012-04-09 DIAGNOSIS — R5381 Other malaise: Secondary | ICD-10-CM | POA: Insufficient documentation

## 2012-04-09 DIAGNOSIS — M1990 Unspecified osteoarthritis, unspecified site: Secondary | ICD-10-CM | POA: Insufficient documentation

## 2012-04-09 DIAGNOSIS — Z8546 Personal history of malignant neoplasm of prostate: Secondary | ICD-10-CM | POA: Insufficient documentation

## 2012-04-09 DIAGNOSIS — Z8673 Personal history of transient ischemic attack (TIA), and cerebral infarction without residual deficits: Secondary | ICD-10-CM | POA: Insufficient documentation

## 2012-04-09 DIAGNOSIS — I10 Essential (primary) hypertension: Secondary | ICD-10-CM | POA: Insufficient documentation

## 2012-04-09 DIAGNOSIS — F329 Major depressive disorder, single episode, unspecified: Secondary | ICD-10-CM | POA: Insufficient documentation

## 2012-04-09 DIAGNOSIS — Z79899 Other long term (current) drug therapy: Secondary | ICD-10-CM | POA: Insufficient documentation

## 2012-04-09 DIAGNOSIS — R29810 Facial weakness: Secondary | ICD-10-CM | POA: Insufficient documentation

## 2012-04-09 DIAGNOSIS — G319 Degenerative disease of nervous system, unspecified: Secondary | ICD-10-CM | POA: Insufficient documentation

## 2012-04-09 DIAGNOSIS — F3289 Other specified depressive episodes: Secondary | ICD-10-CM | POA: Insufficient documentation

## 2012-04-09 DIAGNOSIS — Z7982 Long term (current) use of aspirin: Secondary | ICD-10-CM | POA: Insufficient documentation

## 2012-04-09 DIAGNOSIS — Z7901 Long term (current) use of anticoagulants: Secondary | ICD-10-CM | POA: Insufficient documentation

## 2012-04-09 DIAGNOSIS — R209 Unspecified disturbances of skin sensation: Secondary | ICD-10-CM | POA: Insufficient documentation

## 2012-04-09 LAB — URINALYSIS, ROUTINE W REFLEX MICROSCOPIC
Bilirubin Urine: NEGATIVE
Glucose, UA: NEGATIVE mg/dL
Ketones, ur: NEGATIVE mg/dL
Nitrite: NEGATIVE
Specific Gravity, Urine: 1.027 (ref 1.005–1.030)
pH: 6 (ref 5.0–8.0)

## 2012-04-09 LAB — CK TOTAL AND CKMB (NOT AT ARMC)
CK, MB: 3.6 ng/mL (ref 0.3–4.0)
Relative Index: 0.8 (ref 0.0–2.5)
Total CK: 477 U/L — ABNORMAL HIGH (ref 7–232)

## 2012-04-09 LAB — CBC WITH DIFFERENTIAL/PLATELET
Basophils Relative: 2 % — ABNORMAL HIGH (ref 0–1)
Hemoglobin: 12 g/dL — ABNORMAL LOW (ref 13.0–17.0)
Lymphs Abs: 1 10*3/uL (ref 0.7–4.0)
Monocytes Relative: 9 % (ref 3–12)
Neutro Abs: 1.1 10*3/uL — ABNORMAL LOW (ref 1.7–7.7)
Neutrophils Relative %: 41 % — ABNORMAL LOW (ref 43–77)
Platelets: 194 10*3/uL (ref 150–400)
RBC: 4.13 MIL/uL — ABNORMAL LOW (ref 4.22–5.81)

## 2012-04-09 LAB — COMPREHENSIVE METABOLIC PANEL
ALT: 18 U/L (ref 0–53)
Albumin: 3.6 g/dL (ref 3.5–5.2)
Alkaline Phosphatase: 47 U/L (ref 39–117)
BUN: 21 mg/dL (ref 6–23)
Chloride: 103 mEq/L (ref 96–112)
Glucose, Bld: 139 mg/dL — ABNORMAL HIGH (ref 70–99)
Potassium: 3.5 mEq/L (ref 3.5–5.1)
Sodium: 139 mEq/L (ref 135–145)
Total Bilirubin: 0.3 mg/dL (ref 0.3–1.2)
Total Protein: 6.9 g/dL (ref 6.0–8.3)

## 2012-04-09 LAB — URINE MICROSCOPIC-ADD ON

## 2012-04-09 LAB — TROPONIN I: Troponin I: <0.3 ng/mL (ref <0.30)

## 2012-04-09 MED ORDER — SODIUM CHLORIDE 0.9 % IJ SOLN
3.0000 mL | INTRAMUSCULAR | Status: AC
  Administered 2012-04-09: 3 mL via INTRAVENOUS

## 2012-04-09 NOTE — ED Notes (Signed)
ZOX:WRUE<AV> Expected date:04/09/12<BR> Expected time: 2:33 AM<BR> Means of arrival:Ambulance<BR> Comments:<BR> RESA: L sided numbness and tingling.

## 2012-04-09 NOTE — ED Notes (Addendum)
EMS report: Pt called because he felt he could not move. Upon arrival, EMS found that pt having c/o L sided weakness and bilateral hand numbness.   Pt's daughter reports pt at baseline upon waking w/ these complaints x 1 hr ago. EMS report no neuro deficits, steady gait, no aphasia.

## 2012-04-09 NOTE — ED Notes (Signed)
Patient transported to MRI 

## 2012-04-09 NOTE — ED Notes (Signed)
Pt back from MRI 

## 2012-04-09 NOTE — ED Provider Notes (Signed)
History     CSN: [id number]  Arrival date & time 04/09/12  0255   First MD Initiated Contact with Patient 04/09/12 0715      Chief Complaint  Patient presents with  . Extremity Weakness    (Consider location/radiation/quality/duration/timing/severity/associated sxs/prior treatment) HPI Comments: Patient with hx of TIA, CAD comes in with cc of lower extremity weakness. Pt states that he woke up around 2:30 am (went to bed around 11:30 am), and noted that he was unable to move. He then realized that he had some lower extremity bilateral weakness, and stiffness, he was unable to open his fist, and he also felt that the right side of his face and hands and leg felt "funny." While some of his symptoms have resolved, he continues to feel that the right side is not "normal." He has had TIA, with different symptoms in the past. Patient is not a great historian, but daughter available at bedside who also helped with the history. No trauma, no chest pain, sob, n/v/f/c/ uti like symptoms. No trauma, drug abuse.  Patient is a 78 y.o. male presenting with extremity weakness. The history is provided by the patient, a relative and medical records.  Extremity Weakness Pertinent negatives include no chest pain, no headaches and no shortness of breath.    Past Medical History  Diagnosis Date  . PE (pulmonary embolism)     unprovoked PE completed 6 months of warfarin, warfarin d/ced 02/12/2010, repeat PE 07/10/2010 after a long car ride and now on coumadin.  [name] Hypertension   . CVA (cerebral vascular accident)     MRI 12/10 negative except small vessel changes, CT 12/2009 old R thalamic infarct  . Pituitary microadenoma     incidental finding CT 12/2009  . Cancer   . Prostate cancer   . PE (pulmonary embolism)   . Depression     Past Surgical History  Procedure Date  . Prostatectomy     Family History  Problem Relation Age of Onset  . Hypertension Father     Passed away from cerebral hemorrhage  at age of 45.  [name] Dementia Mother     Passed away  . Hypertension Child     4 adult children    History  Substance Use Topics  . Smoking status: Never Smoker   . Smokeless tobacco: Not on file  . Alcohol Use: No      Review of Systems  Constitutional: Positive for activity change. Negative for fever and chills.  HENT: Negative for hearing loss, neck pain and neck stiffness.   Eyes: Negative for visual disturbance.  Respiratory: Negative for cough, chest tightness and shortness of breath.   Cardiovascular: Negative for chest pain.  Gastrointestinal: Negative for abdominal distention.  Genitourinary: Negative for dysuria, enuresis and difficulty urinating.  Musculoskeletal: Positive for extremity weakness. Negative for arthralgias.  Neurological: Negative for dizziness, tremors, syncope, speech difficulty, weakness, light-headedness, numbness and headaches.  Psychiatric/Behavioral: Negative for confusion.    Allergies  Other  Home Medications   Current Outpatient Rx  Name Route Sig Dispense Refill  . ALPRAZOLAM 0.5 MG PO TABS Oral Take 0.5 mg by mouth 2 (two) times daily as needed. For anxiety    . AMLODIPINE BESYLATE 10 MG PO TABS Oral Take 10 mg by mouth daily.    . ASPIRIN 81 MG PO TABS Oral Take 81 mg by mouth daily.      . ATORVASTATIN CALCIUM 40 MG PO TABS Oral Take 1 tablet (40 mg total)  by mouth daily. 30 tablet 0  . HYDROCHLOROTHIAZIDE 12.5 MG PO CAPS Oral Take 12.5 mg by mouth daily.    . IBUPROFEN 200 MG PO TABS Oral Take 800 mg by mouth every 8 (eight) hours as needed. For pain.    [name] LISINOPRIL 40 MG PO TABS Oral Take 1 tablet (40 mg total) by mouth daily. 30 tablet 3  . OMEPRAZOLE 20 MG PO CPDR Oral Take 1 capsule (20 mg total) by mouth daily. 30 capsule 3  . WARFARIN SODIUM 5 MG PO TABS  Follow dosing instructions from latest visit with Dr. Alexandria Lodge. 45 tablet 1    BP 121/77  Pulse 60  Temp 98.5 F (36.9 C) (Oral)  Resp 17  SpO2 98%  Physical Exam    Vitals reviewed. Constitutional: He is oriented to person, place, and time. He appears well-developed and well-nourished.  HENT:  Head: Normocephalic and atraumatic.  Eyes: EOM are normal. Pupils are equal, round, and reactive to light.  Neck: Normal range of motion. Neck supple. No JVD present.  Cardiovascular: Normal rate and regular rhythm.   Pulmonary/Chest: Effort normal and breath sounds normal. No respiratory distress. He has no wheezes.  Abdominal: Soft. Bowel sounds are normal. He exhibits no distension. There is no tenderness. There is no rebound and no guarding.  Neurological: He is alert and oriented to person, place, and time. No cranial nerve deficit. Coordination normal.       NIHSS - 0 No objective sensory deficits, Motor strength upper and lower extremity 4+ and equal Normal cerebellar exam  Skin: Skin is warm and dry.    ED Course  Procedures (including critical care time)  Labs Reviewed  GLUCOSE, CAPILLARY - Abnormal; Notable for the following:    Glucose-Capillary 134 (*)     All other components within normal limits  APTT - Abnormal; Notable for the following:    aPTT 38 (*)     All other components within normal limits  PROTIME-INR - Abnormal; Notable for the following:    Prothrombin Time 26.8 (*)     INR 2.43 (*)     All other components within normal limits  CK TOTAL AND CKMB - Abnormal; Notable for the following:    Total CK 477 (*)     All other components within normal limits  CBC WITH DIFFERENTIAL - Abnormal; Notable for the following:    WBC 2.7 (*)     RBC 4.13 (*)     Hemoglobin 12.0 (*)     HCT 37.7 (*)     Neutrophils Relative 41 (*)     Neutro Abs 1.1 (*)     Eosinophils Relative 13 (*)     Basophils Relative 2 (*)     All other components within normal limits  COMPREHENSIVE METABOLIC PANEL - Abnormal; Notable for the following:    Glucose, Bld 139 (*)     GFR calc non Af Amer 66 (*)     GFR calc Af Amer 77 (*)     All other  components within normal limits  URINALYSIS, ROUTINE W REFLEX MICROSCOPIC - Abnormal; Notable for the following:    Hgb urine dipstick SMALL (*)     Leukocytes, UA TRACE (*)     All other components within normal limits  TROPONIN I  TROPONIN I  URINE MICROSCOPIC-ADD ON   Dg Chest 1 View  04/09/2012  *RADIOLOGY REPORT*  Clinical Data: Right chest pain, extremity weakness  CHEST - 1 VIEW  Comparison:  11/03/2011  Findings: Left basilar scarring.  Lungs are otherwise clear. No pleural effusion or pneumothorax.  Cardiomediastinal silhouette is within normal limits.  IMPRESSION: No evidence of acute cardiopulmonary disease.  Original Report Authenticated By: Charline Bills, M.D.   Ct Head Wo Contrast  04/09/2012  *RADIOLOGY REPORT*  Clinical Data: Extremity weakness.  CT HEAD WITHOUT CONTRAST  Technique:  Contiguous axial images were obtained from the base of the skull through the vertex without contrast.  Comparison: Head CT 08/11/2011.  Findings: There is a well-defined focus of decreased attenuation in the right thalamus which is unchanged, consistent with an old lacunar infarction.  Mild cerebral and cerebellar atrophy is age appropriate.  In addition, there are patchy and confluent areas of decreased attenuation throughout the deep and periventricular white matter of the cerebral hemispheres bilaterally, compatible with chronic microvascular ischemic changes. Known pituitary mass is similar to prior studies measuring approximately 1.6 x 1.5 cm, and better demonstrated on prior brain MRI.  No definite acute intracranial abnormality.  Specifically, no definite evidence of acute/subacute cerebral ischemia, no acute intraparenchymal hemorrhage, no focal mass effect, hydrocephalus or abnormal intra or extra-axial fluid collections.  No acute displaced skull fractures are identified.  Visualized paranasal sinuses and mastoids are well pneumatized.  IMPRESSION: 1.  No acute intracranial abnormalities. 2.  Known pituitary mass is similar to prior examinations, and better demonstrated on prior brain MRI. 3.  Mild cerebral and cerebellar atrophy (age appropriate, with old right thalamic lacunar infarction and chronic microvascular ischemic changes in the cerebral white matter redemonstrated, as above.  Original Report Authenticated By: Florencia Reasons, M.D.     No diagnosis found.    MDM   Date: 04/11/2012  Rate:65  Rhythm: normal sinus rhythm  QRS Axis: left  Intervals: PR prolonged  ST/T Wave abnormalities: nonspecific ST changes  Conduction Disutrbances:right bundle branch block and left anterior fascicular block  Narrative Interpretation:   Old EKG Reviewed: unchanged  Patient with hx of TIA comes in with some non specific cc's comprising of lower extremity weakness bilateral, stiffness, difficulty with hand dexterity bilaterally and some right sided deficits that patient is unable to elaborate.  The sx dont pertain to specific brain vascular territory. The neck eam and back exam are normal, and there are no clinical concerns for cord compression. NIHSS is 0, however, given patient is a poor historian, hx of TIA, and unilateral complains, will call neurology.   LATE ENTRY: Neurology recommended MRI. MRI was negative (brain). Patient discharged after cleared by Neurology.        Derwood Kaplan, MD 04/11/12 [phone]

## 2012-04-09 NOTE — ED Notes (Signed)
MD neurologist at bedside.

## 2012-04-09 NOTE — ED Notes (Signed)
Bed:WA16<BR> Expected date:<BR> Expected time:<BR> Means of arrival:<BR> Comments:<BR> Res A

## 2012-04-09 NOTE — Consult Note (Signed)
Reason for Right Consult: Rule out stroke Referring Physician: Rhunette Croft  CC: Right sided numbness and weakness  HPI: [name] [name] is an 78 y.o. male who reports awakening about 0130 this morning and feeling numbness in his right foot.  He then developed numbness in his right foot as well.  This then progressed to pain in his right thigh.  Patient presented for evaluation at that time and it seems that he is a poor historian and his history has changed on multiple occasions.  All symptoms have now resolved. Patient reports having a history of rib fractures on the right and having frequent bouts of chest pain related to this.  Also reports that he has been diagnosed with a rotated spine and started PT within the past week.  CT was performed that showed no acute changes.    Past Medical History  Diagnosis Date  . PE (pulmonary embolism)     unprovoked PE completed 6 months of warfarin, warfarin d/ced 02/12/2010, repeat PE 07/10/2010 after a long car ride and now on coumadin.  [name] Hypertension   . CVA (cerebral vascular accident)     MRI 12/10 negative except small vessel changes, CT 12/2009 old R thalamic infarct  . Pituitary microadenoma     incidental finding CT 12/2009  . Cancer   . Prostate cancer   . PE (pulmonary embolism)   . Depression     Past Surgical History  Procedure Date  . Prostatectomy     Family History  Problem Relation Age of Onset  . Hypertension Father     Passed away from cerebral hemorrhage at age of 67.  [name] Dementia Mother     Passed away  . Hypertension Child     4 adult children    Social History:  reports that he has never smoked. He does not have any smokeless tobacco history on file. He reports that he does not drink alcohol or use illicit drugs.  Allergies  Allergen Reactions  . Other     Shrimp gives him gout    Medications:  I have reviewed the patient's current medications. Scheduled:   . sodium chloride  3 mL Intravenous STAT     ROS: History obtained from the patient  General ROS: negative for - chills, fatigue, fever, night sweats, weight gain or weight loss Psychological ROS: negative for - behavioral disorder, hallucinations, memory difficulties, mood swings or suicidal ideation Ophthalmic ROS: negative for - blurry vision, double vision, eye pain or loss of vision ENT ROS: negative for - epistaxis, nasal discharge, oral lesions, sore throat, tinnitus or vertigo Allergy and Immunology ROS: negative for - hives or itchy/watery eyes Hematological and Lymphatic ROS: negative for - bleeding problems, bruising or swollen lymph nodes Endocrine ROS: negative for - galactorrhea, hair pattern changes, polydipsia/polyuria or temperature intolerance Respiratory ROS: negative for - cough, hemoptysis, shortness of breath or wheezing Cardiovascular ROS: negative for - chest pain, dyspnea on exertion, edema or irregular heartbeat Gastrointestinal ROS: negative for - abdominal pain, diarrhea, hematemesis, nausea/vomiting or stool incontinence Genito-Urinary ROS: negative for - dysuria, hematuria, incontinence or urinary frequency/urgency Musculoskeletal ROS: as noted in HPI Neurological ROS: as noted in HPI Dermatological ROS: negative for rash and skin lesion changes  Physical Examination: Blood pressure 121/77, pulse 60, temperature 98.5 F (36.9 C), temperature source Oral, resp. rate 17, SpO2 98.00%.  Neurologic Examination Mental Status: Alert and oriented.  Speech slow but fluent without evidence of aphasia.  Able to follow 3 step commands without  difficulty. Cranial Nerves: II: visual fields grossly normal, pupils equal, round, reactive to light and accommodation [name],IV, VI: ptosis not present, extra-ocular motions intact bilaterally V,VII: smile symmetric, facial light touch sensation normal bilaterally VIII: hearing normal bilaterally IX,X: gag reflex present XI: trapezius strength/neck flexion strength  normal bilaterally XII: tongue strength normal  Motor: Right : Upper extremity   5/5    Left:     Upper extremity   5/5  Lower extremity   5/5     Lower extremity   5/5 Sensory: Pinprick and light touch intact throughout, bilaterally Deep Tendon Reflexes: 2+ and symmetric throughout Plantars: Right: downgoing   Left: downgoing Cerebellar: normal finger-to-nose and normal heel-to-shin test  Laboratory Studies:   Basic Metabolic Panel:  Lab 04/09/12 1610  NA 139  K 3.5  CL 103  CO2 23  GLUCOSE 139*  BUN 21  CREATININE 1.04  CALCIUM 9.2  MG --  PHOS --    Liver Function Tests:  Lab 04/09/12 0250  AST 30  ALT 18  ALKPHOS 47  BILITOT 0.3  PROT 6.9  ALBUMIN 3.6   No results found for this basename: LIPASE:5,AMYLASE:5 in the last 168 hours No results found for this basename: AMMONIA:3 in the last 168 hours  CBC:  Lab 04/09/12 0250  WBC 2.7*  NEUTROABS 1.1*  HGB 12.0*  HCT 37.7*  MCV 91.3  PLT 194    Cardiac Enzymes:  Lab 04/09/12 1009 04/09/12 0559  CKTOTAL -- 477*  CKMB -- 3.6  CKMBINDEX -- --  TROPONINI <0.30 <0.30    BNP: No components found with this basename: POCBNP:5  CBG:  Lab 04/09/12 0532  GLUCAP 134*    Microbiology: Results for orders placed during the hospital encounter of 03/08/11  URINE CULTURE     Status: Normal   Collection Time   03/08/11  3:58 PM      Component Value Range Status Comment   Specimen Description URINE, CLEAN CATCH   Final    Special Requests NONE   Final    Culture  Setup Time [id number]   Final    Colony Count >=100,000 COLONIES/ML   Final    Culture ESCHERICHIA COLI   Final    Report Status 03/10/2011 FINAL   Final    Organism ID, Bacteria ESCHERICHIA COLI   Final     Coagulation Studies:  Basename 04/09/12 0559  LABPROT 26.8*  INR 2.43*    Urinalysis:  Lab 04/09/12 1015  COLORURINE YELLOW  LABSPEC 1.027  PHURINE 6.0  GLUCOSEU NEGATIVE  HGBUR SMALL*  BILIRUBINUR NEGATIVE  KETONESUR  NEGATIVE  PROTEINUR NEGATIVE  UROBILINOGEN 0.2  NITRITE NEGATIVE  LEUKOCYTESUR TRACE*    Lipid Panel:     Component Value Date/Time   CHOL 247* 11/04/2011 0115   TRIG 146 11/04/2011 0115   HDL 67 11/04/2011 0115   CHOLHDL 3.7 11/04/2011 0115   VLDL 29 11/04/2011 0115   LDLCALC 151* 11/04/2011 0115    HgbA1C:  Lab Results  Component Value Date   HGBA1C  Value: 6.6 (NOTE) The ADA recommends the following therapeutic goal for glycemic control related to Hgb A1c measurement: Goal of therapy: <6.5 Hgb A1c  Reference: American Diabetes Association: Clinical Practice Recommendations 2010, Diabetes Care, 2010, 33: (Suppl  1).* 09/08/2009    Urine Drug Screen:     Component Value Date/Time   LABOPIA NONE DETECTED 08/11/2011 2331   COCAINSCRNUR NONE DETECTED 08/11/2011 2331   LABBENZ NONE DETECTED 08/11/2011 2331   AMPHETMU NONE  DETECTED 08/11/2011 2331   THCU NONE DETECTED 08/11/2011 2331   LABBARB NONE DETECTED 08/11/2011 2331    Alcohol Level: No results found for this basename: ETH:2 in the last 168 hours  Imaging: Dg Chest 1 View  04/09/2012  *RADIOLOGY REPORT*  Clinical Data: Right chest pain, extremity weakness  CHEST - 1 VIEW  Comparison: 11/03/2011  Findings: Left basilar scarring.  Lungs are otherwise clear. No pleural effusion or pneumothorax.  Cardiomediastinal silhouette is within normal limits.  IMPRESSION: No evidence of acute cardiopulmonary disease.  Original Report Authenticated By: Charline Bills, M.D.   Ct Head Wo Contrast  04/09/2012  *RADIOLOGY REPORT*  Clinical Data: Extremity weakness.  CT HEAD WITHOUT CONTRAST  Technique:  Contiguous axial images were obtained from the base of the skull through the vertex without contrast.  Comparison: Head CT 08/11/2011.  Findings: There is a well-defined focus of decreased attenuation in the right thalamus which is unchanged, consistent with an old lacunar infarction.  Mild cerebral and cerebellar atrophy is age appropriate.  In  addition, there are patchy and confluent areas of decreased attenuation throughout the deep and periventricular white matter of the cerebral hemispheres bilaterally, compatible with chronic microvascular ischemic changes. Known pituitary mass is similar to prior studies measuring approximately 1.6 x 1.5 cm, and better demonstrated on prior brain MRI.  No definite acute intracranial abnormality.  Specifically, no definite evidence of acute/subacute cerebral ischemia, no acute intraparenchymal hemorrhage, no focal mass effect, hydrocephalus or abnormal intra or extra-axial fluid collections.  No acute displaced skull fractures are identified.  Visualized paranasal sinuses and mastoids are well pneumatized.  IMPRESSION: 1.  No acute intracranial abnormalities. 2. Known pituitary mass is similar to prior examinations, and better demonstrated on prior brain MRI. 3.  Mild cerebral and cerebellar atrophy (age appropriate, with old right thalamic lacunar infarction and chronic microvascular ischemic changes in the cerebral white matter redemonstrated, as above.  Original Report Authenticated By: Florencia Reasons, M.D.   Mr Brain Wo Contrast  04/09/2012  *RADIOLOGY REPORT*  Clinical Data: 78 year old male with right facial droop.  History of stroke.  Extremity weakness.  MRI HEAD WITHOUT CONTRAST  Technique:  Multiplanar, multiecho pulse sequences of the brain and surrounding structures were obtained according to standard protocol without intravenous contrast.  Comparison: [facility] brain MRI 11/19/2011 and earlier.  Findings: No restricted diffusion to suggest acute infarction.  Chronic rounded intrasellar pituitary mass with restricted diffusion measures 15 x 13 15 x 17 x 13 mm (AP by transverse by CC) and has only mildly increased since 2011 ( 14 x 17 x 12 mm at that time).  The infundibulum is posteriorly displaced as before.  No abnormal T2* signal within the mass.  No suprasellar extension or mass  effect.  No other intracranial mass lesion.  No ventriculomegaly.  No mass effect. No acute intracranial hemorrhage identified.  Major intracranial vascular flow voids are stable.  Negative cervicomedullary junction.  Negative for age cervical spine. Normal bone marrow signal.  Chronic diffuse dilated perivascular spaces with superimposed scattered patchy cerebral white matter T2 and FLAIR hyperintensity, most confluent in the periatrial regions.  Wallace Cullens and white matter signal has not significantly changed since 2011.  Postoperative changes to the globes. Visualized paranasal sinuses and mastoids are clear.  Grossly negative visualized internal auditory structures.  Negative scalp soft tissues.  IMPRESSION: 1. No acute intracranial abnormality. 2.  Chronic pituitary macroadenoma, minimally increased since 2011. No mass effect on surrounding structures. 3.  Chronic  dilated perivascular spaces and cerebral white matter disease, favor chronic small vessel disease.  Original Report Authenticated By: Harley Hallmark, M.D.     Assessment/Plan:  Patient Active Hospital Problem List: Right lower extremity numbness and pain   Assessment:  Patient with focal complaints but with a character not particularly suggestive of an ischemic event. Patient a poor historian and to some degree very difficult to determine exactly what is going on.  Patient is on Coumadin with therapeutic INR.  Although suspicion low, MRI of the brain suggested (patient with risk factors of CVA in the past and hypertension) for complete work up.  MRI shows the pituitary adenoma that has been seen in the past with no significant progression and no acute ischemic event.  Patient is symptom free at this time.     Plan:  No further neurologic intervention is recommended at this time.  If further questions arise, please call or page at that time.  Thank you for allowing neurology to participate in the care of this patient.     Thana Farr,  MD [facility] [phone] 04/09/2012, 12:48 PM

## 2012-04-09 NOTE — ED Notes (Addendum)
Pt reports having pain at 1900, falling asleep at 2300, and waking up feeling weak, unable to move. Hx of TIA, HTN, and PE. Pt reports having a similar episode last Thanksgiving when he got lost heading to his daughter's house, but was not diagnosed with anything. [place] Stroke Scale performed. Face asymmetrical with smile, but that is baseline per family. No weakness on bilateral arms and legs upon dorsiflex, plantarflex, and grip. Pt family member reports pt has a back problem for which he was being seen for and it was normal for him to have trouble getting up for ambulation. Pt was able to ambulate in hall 50 ft with assistance.

## 2012-04-09 NOTE — ED Notes (Signed)
Pt EKG was completed 0357.

## 2012-04-10 ENCOUNTER — Ambulatory Visit (INDEPENDENT_AMBULATORY_CARE_PROVIDER_SITE_OTHER): Payer: Medicare Other | Admitting: Pharmacist

## 2012-04-10 DIAGNOSIS — Z7901 Long term (current) use of anticoagulants: Secondary | ICD-10-CM

## 2012-04-10 DIAGNOSIS — I2699 Other pulmonary embolism without acute cor pulmonale: Secondary | ICD-10-CM

## 2012-04-10 DIAGNOSIS — Z7902 Long term (current) use of antithrombotics/antiplatelets: Secondary | ICD-10-CM

## 2012-04-10 LAB — POCT INR: INR: 2.6

## 2012-04-10 NOTE — Progress Notes (Signed)
Anti-Coagulation Progress Note  [name] [name] is a 78 y.o. male who is currently on an anti-coagulation regimen.    RECENT RESULTS: Recent results are below, the most recent result is correlated with a dose of 42.5 mg. per week: Lab Results  Component Value Date   INR 2.60 04/10/2012   INR 2.43* 04/09/2012   INR 2.30 03/20/2012    ANTI-COAG DOSE:   Latest dosing instructions   Total Sun Mon Tue Wed Thu Fri Sat   42.5 5 mg 7.5 mg 5 mg 7.5 mg 5 mg 7.5 mg 5 mg    (5 mg1) (5 mg1.5) (5 mg1) (5 mg1.5) (5 mg1) (5 mg1.5) (5 mg1)         ANTICOAG SUMMARY: Anticoagulation Episode Summary              Current INR goal 2.0-3.0 Next INR check 05/08/2012   INR from last check 2.60 (04/10/2012)     Weekly max dose (mg)  Target end date Indefinite   Indications Pulmonary embolism, Encounter for long-term use of antiplatelets/antithrombotics   INR check location [facility] Preferred lab    Send INR reminders to ANTICOAG IMP   Comments History of multiple venous embolic episodes. Will continue to annual re-evaluate continued need for warfarin weighing risks vs. benefits.        Provider Role Specialty Phone number   Farley Ly, MD  Internal Medicine [phone]        ANTICOAG TODAY: Anticoagulation Summary as of 04/10/2012              INR goal 2.0-3.0     Selected INR 2.60 (04/10/2012) Next INR check 05/08/2012   Weekly max dose (mg)  Target end date Indefinite   Indications Pulmonary embolism, Encounter for long-term use of antiplatelets/antithrombotics    Anticoagulation Episode Summary              INR check location [facility] Preferred lab    Send INR reminders to ANTICOAG IMP   Comments History of multiple venous embolic episodes. Will continue to annual re-evaluate continued need for warfarin weighing risks vs. benefits.        Provider Role Specialty Phone number   Farley Ly, MD  Internal Medicine [phone]        PATIENT  INSTRUCTIONS: Patient Instructions  Patient instructed to take medications as defined in the Anti-coagulation Track section of this encounter.  Patient instructed to take today's dose.  Patient verbalized understanding of these instructions.        FOLLOW-UP Return in 4 weeks (on 05/08/2012) for Follow up INR at 1100h.  [name], [name] Pharm.D., CACP

## 2012-04-10 NOTE — Patient Instructions (Signed)
Patient instructed to take medications as defined in the Anti-coagulation Track section of this encounter.  Patient instructed to take today's dose.  Patient verbalized understanding of these instructions.    

## 2012-05-08 ENCOUNTER — Ambulatory Visit: Payer: Medicare Other

## 2012-05-08 ENCOUNTER — Encounter: Payer: Medicare Other | Admitting: Internal Medicine

## 2012-05-15 ENCOUNTER — Other Ambulatory Visit: Payer: Self-pay | Admitting: Internal Medicine

## 2012-05-25 ENCOUNTER — Other Ambulatory Visit: Payer: Self-pay | Admitting: Internal Medicine

## 2012-05-29 ENCOUNTER — Ambulatory Visit (INDEPENDENT_AMBULATORY_CARE_PROVIDER_SITE_OTHER): Payer: Medicare Other | Admitting: Pharmacist

## 2012-05-29 DIAGNOSIS — Z7901 Long term (current) use of anticoagulants: Secondary | ICD-10-CM

## 2012-05-29 DIAGNOSIS — I2699 Other pulmonary embolism without acute cor pulmonale: Secondary | ICD-10-CM

## 2012-05-29 DIAGNOSIS — Z7902 Long term (current) use of antithrombotics/antiplatelets: Secondary | ICD-10-CM

## 2012-05-29 LAB — POCT INR: INR: 2.6

## 2012-05-29 NOTE — Patient Instructions (Signed)
Patient instructed to take medications as defined in the Anti-coagulation Track section of this encounter.  Patient instructed to take today's dose.  Patient verbalized understanding of these instructions.    

## 2012-05-29 NOTE — Progress Notes (Signed)
Anti-Coagulation Progress Note  [name] [name] is a 78 y.o. male who is currently on an anti-coagulation regimen.    RECENT RESULTS: Recent results are below, the most recent result is correlated with a dose of 42.5 mg. per week: Lab Results  Component Value Date   INR 2.60 05/29/2012   INR 2.60 04/10/2012   INR 2.43* 04/09/2012    ANTI-COAG DOSE:   Latest dosing instructions   Total Sun Mon Tue Wed Thu Fri Sat   42.5 5 mg 7.5 mg 5 mg 7.5 mg 5 mg 7.5 mg 5 mg    (5 mg1) (5 mg1.5) (5 mg1) (5 mg1.5) (5 mg1) (5 mg1.5) (5 mg1)         ANTICOAG SUMMARY: Anticoagulation Episode Summary              Current INR goal 2.0-3.0 Next INR check 06/26/2012   INR from last check 2.60 (05/29/2012)     Weekly max dose (mg)  Target end date Indefinite   Indications Pulmonary embolism, Encounter for long-term use of antiplatelets/antithrombotics   [facility] check location [facility]    Send INR reminders to [facility]   Comments History of multiple venous embolic episodes. Will continue to annual re-evaluate continued need for warfarin weighing risks vs. benefits.        Provider Role Specialty Phone number   Farley Ly, MD  Internal Medicine [phone]        ANTICOAG TODAY: Anticoagulation Summary as of 05/29/2012              INR goal 2.0-3.0     Selected INR 2.60 (05/29/2012) Next INR check 06/26/2012   Weekly max dose (mg)  Target end date Indefinite   Indications Pulmonary embolism, Encounter for long-term use of antiplatelets/antithrombotics    Anticoagulation Episode Summary              INR check location [facility]    Send INR reminders to [facility]   Comments History of multiple venous embolic episodes. Will continue to annual re-evaluate continued need for warfarin weighing risks vs. benefits.        Provider Role Specialty Phone number   Farley Ly, MD  Internal Medicine [phone]        PATIENT  INSTRUCTIONS: Patient Instructions  Patient instructed to take medications as defined in the Anti-coagulation Track section of this encounter.  Patient instructed to take today's dose.  Patient verbalized understanding of these instructions.        FOLLOW-UP Return in 4 weeks (on 06/26/2012) for Follow up INR at 1045h.  [name], [name] Pharm.D., CACP

## 2012-06-26 ENCOUNTER — Ambulatory Visit (INDEPENDENT_AMBULATORY_CARE_PROVIDER_SITE_OTHER): Payer: Medicare Other | Admitting: Pharmacist

## 2012-06-26 DIAGNOSIS — Z7902 Long term (current) use of antithrombotics/antiplatelets: Secondary | ICD-10-CM

## 2012-06-26 DIAGNOSIS — Z7901 Long term (current) use of anticoagulants: Secondary | ICD-10-CM

## 2012-06-26 DIAGNOSIS — Z5181 Encounter for therapeutic drug level monitoring: Secondary | ICD-10-CM

## 2012-06-26 DIAGNOSIS — I2699 Other pulmonary embolism without acute cor pulmonale: Secondary | ICD-10-CM

## 2012-06-26 LAB — POCT INR: INR: 2.9

## 2012-06-26 NOTE — Progress Notes (Signed)
Agree with plan 

## 2012-06-26 NOTE — Patient Instructions (Signed)
Patient instructed to take medications as defined in the Anti-coagulation Track section of this encounter.  Patient instructed to take today's dose.  Patient verbalized understanding of these instructions.    

## 2012-06-26 NOTE — Progress Notes (Signed)
Anti-Coagulation Progress Note  Cora Stetson is a 78 y.o. male who is currently on an anti-coagulation regimen.    RECENT RESULTS: Recent results are below, the most recent result is correlated with a dose of 42.5 mg. per week: Lab Results  Component Value Date   INR 2.90 06/26/2012   INR 2.60 05/29/2012   INR 2.60 04/10/2012    ANTI-COAG DOSE:   Latest dosing instructions   Total Sun Mon Tue Wed Thu Fri Sat   40 5 mg 7.5 mg 5 mg 5 mg 7.5 mg 5 mg 5 mg    (5 mg1) (5 mg1.5) (5 mg1) (5 mg1) (5 mg1.5) (5 mg1) (5 mg1)         ANTICOAG SUMMARY: Anticoagulation Episode Summary              Current INR goal 2.0-3.0 Next INR check 07/24/2012   INR from last check 2.90 (06/26/2012)     Weekly max dose (mg)  Target end date Indefinite   Indications Pulmonary embolism, Encounter for long-term use of antiplatelets/antithrombotics   [facility] check location [facility]    Send INR reminders to [facility]   Comments History of multiple venous embolic episodes. Will continue to annual re-evaluate continued need for warfarin weighing risks vs. benefits.        Provider Role Specialty Phone number   Farley Ly, MD  Internal Medicine [phone]        ANTICOAG TODAY: Anticoagulation Summary as of 06/26/2012              INR goal 2.0-3.0     Selected INR 2.90 (06/26/2012) Next INR check 07/24/2012   Weekly max dose (mg)  Target end date Indefinite   Indications Pulmonary embolism, Encounter for long-term use of antiplatelets/antithrombotics    Anticoagulation Episode Summary              [facility] check [facility]    Send INR reminders to [facility]   Comments History of multiple venous embolic episodes. Will continue to annual re-evaluate continued need for warfarin weighing risks vs. benefits.        Provider Role Specialty Phone number   Farley Ly, MD  Internal Medicine [phone]        PATIENT  INSTRUCTIONS: Patient Instructions  Patient instructed to take medications as defined in the Anti-coagulation Track section of this encounter.  Patient instructed to take today's dose.  Patient verbalized understanding of these instructions.        FOLLOW-UP Return in 4 weeks (on 07/24/2012) for Follow up INR at 1130h.  [name], [name] Pharm.D., CACP

## 2012-06-28 ENCOUNTER — Ambulatory Visit: Payer: Medicare Other | Admitting: Internal Medicine

## 2012-06-30 ENCOUNTER — Encounter: Payer: Self-pay | Admitting: Internal Medicine

## 2012-06-30 ENCOUNTER — Ambulatory Visit (INDEPENDENT_AMBULATORY_CARE_PROVIDER_SITE_OTHER): Payer: Medicare Other | Admitting: Internal Medicine

## 2012-06-30 ENCOUNTER — Ambulatory Visit: Payer: Medicare Other | Admitting: Internal Medicine

## 2012-06-30 VITALS — BP 117/73 | HR 69 | Temp 96.7°F | Ht 68.0 in | Wt 164.7 lb

## 2012-06-30 DIAGNOSIS — I10 Essential (primary) hypertension: Secondary | ICD-10-CM

## 2012-06-30 DIAGNOSIS — G3184 Mild cognitive impairment, so stated: Secondary | ICD-10-CM

## 2012-06-30 DIAGNOSIS — E785 Hyperlipidemia, unspecified: Secondary | ICD-10-CM

## 2012-06-30 DIAGNOSIS — I2699 Other pulmonary embolism without acute cor pulmonale: Secondary | ICD-10-CM

## 2012-06-30 DIAGNOSIS — G4700 Insomnia, unspecified: Secondary | ICD-10-CM

## 2012-06-30 LAB — LIPID PANEL
HDL: 56 mg/dL (ref >39)
Total CHOL/HDL Ratio: 3.7 Ratio
Triglycerides: 129 mg/dL (ref <150)

## 2012-06-30 MED ORDER — ATORVASTATIN CALCIUM 40 MG PO TABS: 40.0000 mg | ORAL_TABLET | Freq: Every day | ORAL | Status: DC

## 2012-06-30 MED ORDER — WARFARIN SODIUM 5 MG PO TABS: ORAL_TABLET | ORAL | Status: DC

## 2012-06-30 NOTE — Assessment & Plan Note (Addendum)
Lab Results  Component Value Date   NA 139 04/09/2012   NA 140 11/12/2011   K 3.5 04/09/2012   CL 103 04/09/2012   CO2 23 04/09/2012   BUN 21 04/09/2012   BUN 17 11/12/2011   CREATININE 1.04 04/09/2012   CREATININE 1.01 11/24/2011   CREATININE 1.1 11/12/2011    BP Readings from Last 3 Encounters:  06/30/12 117/73  04/09/12 114/70  01/20/12 105/74    Assessment: Hypertension control:  controlled  Progress toward goals:  at goal Barriers to meeting goals:  no barriers identified  Plan: Hypertension treatment:   Will d/c HCTZ 12.5 to simplify regimen.  Cont amlodipine 10 and lisinopril 40.  Though I could d/c amlodipine and increase HCTZ, there may be decreased quality of life with increased urination, and amlodipine 10 is a potent medication.  Recheck in 3 months.  Repeat BMET at that time.  Of note, echo from 2011 suggests Grade 1 diastolic dysfunction, and since we are d/c his diuretic, assess for SOB at follow up.

## 2012-06-30 NOTE — Assessment & Plan Note (Signed)
Stable with 0.5 mg xanax qHS.  He does not like to sleep for too long because he worries his wife will wander off.

## 2012-06-30 NOTE — Patient Instructions (Signed)
-  You can stop taking hydrochlorothiazide - this way you will just have two medications for your blood pressure, which are amlodipine & lisinopril.  -Continue taking atorvastatin daily for your cholesterol.  This medication works best if taken before bed  -I will call you when I have completed your DMV paperwork  Please be sure to bring all of your medications with you to every visit.  Should you have any new or worsening symptoms, please be sure to call the clinic at [phone].

## 2012-06-30 NOTE — Assessment & Plan Note (Signed)
Refilled atorvastatin 40 - encouraged to take at night.  Repeat Lipid panel today.

## 2012-06-30 NOTE — Progress Notes (Signed)
Subjective:   Patient ID: [name] [name] male   DOB: 1931-02-05 78 y.o.   MRN: [id number]  HPI: [name] [name] is a 78 y.o. man with PMH significant for HTN, CVA, PE on chronic anticoagulation presents for routine followup.  He has no complaints.  Would like to simplify his medication regimen. Notes that his wife is getting worse (alzheimers).  Requests DMV papers be filled out.   Past Medical History  Diagnosis Date  . PE (pulmonary embolism)     unprovoked PE completed 6 months of warfarin, warfarin d/ced 02/12/2010, repeat PE 07/10/2010 after a long car ride and now on coumadin.  [name] Hypertension   . CVA (cerebral vascular accident)     MRI 12/10 negative except small vessel changes, CT 12/2009 old R thalamic infarct  . Pituitary microadenoma     incidental finding CT 12/2009  . Cancer   . Prostate cancer   . PE (pulmonary embolism)   . Depression    Current Outpatient Prescriptions  Medication Sig Dispense Refill  . ALPRAZolam (XANAX) 0.5 MG tablet Take 0.5 mg by mouth 2 (two) times daily as needed. For anxiety      . amLODipine (NORVASC) 10 MG tablet Take 10 mg by mouth daily.      [name] aspirin 81 MG tablet Take 81 mg by mouth daily.        [name] atorvastatin (LIPITOR) 40 MG tablet TAKE 1 TABLET BY MOUTH EVERY DAY  30 tablet  0  . hydrochlorothiazide (MICROZIDE) 12.5 MG capsule Take 12.5 mg by mouth daily.      [name] ibuprofen (ADVIL,MOTRIN) 200 MG tablet Take 800 mg by mouth every 8 (eight) hours as needed. For pain.      [name] lisinopril (PRINIVIL,ZESTRIL) 40 MG tablet Take 1 tablet (40 mg total) by mouth daily.  30 tablet  3  . omeprazole (PRILOSEC) 20 MG capsule Take 1 capsule (20 mg total) by mouth daily.  30 capsule  3  . warfarin (COUMADIN) 5 MG tablet FOLLOW DOSING DIRECTIONS FROM LATEST OFFICE VISIT  45 tablet  0   Family History  Problem Relation Age of Onset  . Hypertension Father     Passed away from cerebral hemorrhage at age of 48.  [name] Dementia Mother     Passed away  .  Hypertension Child     4 adult children   History   Social History  . Marital Status: Married    Spouse Name: N/A    Number of Children: N/A  . Years of Education: N/A   Occupational History  .      retired   Social History Main Topics  . Smoking status: Never Smoker   . Smokeless tobacco: None  . Alcohol Use: No  . Drug Use: No  . Sexually Active: No   Other Topics Concern  . None   Social History Narrative   Lives with his wife who has dementia at their home.  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  HEENT: Denies photophobia, eye pain, redness, hearing loss, ear pain, congestion, sore throat, rhinorrhea, sneezing, mouth sores, trouble swallowing, neck pain, neck stiffness and tinnitus.   Respiratory: Denies SOB, DOE, cough, chest tightness,  and wheezing.   Cardiovascular: Denies chest pain, palpitations and leg swelling.  Gastrointestinal: Denies nausea, vomiting, abdominal pain, diarrhea, constipation, blood in stool and abdominal distention.  Genitourinary: Denies dysuria, urgency, frequency, hematuria, flank pain and  difficulty urinating.  Musculoskeletal: Denies myalgias, back pain, joint swelling, arthralgias and gait problem.  Skin: Denies pallor, rash and wound.  Neurological: Denies dizziness, seizures, syncope, weakness, light-headedness, numbness and headaches.  Psychiatric/Behavioral: Denies suicidal ideation, mood changes, confusion, nervousness, and agitation  Objective:  Physical Exam: Filed Vitals:   06/30/12 1034  BP: 117/73  Pulse: 69  Temp: 96.7 F (35.9 C)  TempSrc: Oral  Height: 5\' 8"  (1.727 m)  Weight: 164 lb 11.2 oz (74.707 kg)  SpO2: 98%   Constitutional: Vital signs reviewed.  Patient is a well-developed and well-nourished man in no acute distress and cooperative with exam.  Mouth: no erythema or exudates, MMM Eyes: PERRL, EOMI, conjunctivae  normal, No scleral icterus.  Neck: Supple, Trachea midline normal ROM, No JVD, mass, thyromegaly, or carotid bruit present.  Cardiovascular: RRR, S1 normal, S2 normal, no MRG, pulses symmetric and intact bilaterally Pulmonary/Chest: CTAB, no wheezes, rales, or rhonchi Abdominal: Soft. Non-tender, non-distended, bowel sounds are normal, no masses, organomegaly, or guarding present.  GU: no CVA tenderness Neurological: A&O x3, Strength is normal and symmetric bilaterally, cranial nerve II-XII are grossly intact, no focal motor deficit, sensory intact to light touch bilaterally.  MMSE 28/30. Good ROM b/l UE & LE. Skin: Warm, dry and intact. No rash, cyanosis, or clubbing.  Psychiatric: Normal mood and affect. speech and behavior is normal. Judgment and thought content normal. Cognition and memory are normal.   Assessment & Plan:   Case and care discussed with Dr. Dalphine Handing.  Please see problem oriented charting for further details. Patient to return in 3 months for BP follow up and BMET.

## 2012-06-30 NOTE — Assessment & Plan Note (Signed)
MMSE stable today (28/30) - this is the 3rd normal exam he has had this year.  Today he again failed to recall one of the 3 objections, and could not recall today's date.  I have filled out paperwork for him to return to the [facility] for his license.  He has gone almost 1 year without AMS, and that was likely due to sleep deprivation and too much amitriptyline, which has since been discontinued.  He himself reports difficulty with night vision & on the highway, so he will need a restricted license if he is granted one by the [facility].  He will require annual MMSE.    Also, he cares for his wife who has progressive alzheimers. I informed him to let us know if he can not longer manage caring for her, and we will try to help with placement. 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

## 2012-07-03 MED ORDER — ROSUVASTATIN CALCIUM 20 MG PO TABS: 20.0000 mg | ORAL_TABLET | Freq: Every day | ORAL | Status: DC

## 2012-07-03 NOTE — Addendum Note (Signed)
Addended by: Belia Heman on: 07/03/2012 09:26 AM   Modules accepted: Orders

## 2012-07-24 ENCOUNTER — Ambulatory Visit (INDEPENDENT_AMBULATORY_CARE_PROVIDER_SITE_OTHER): Payer: Medicare Other | Admitting: Pharmacist

## 2012-07-24 DIAGNOSIS — Z7901 Long term (current) use of anticoagulants: Secondary | ICD-10-CM

## 2012-07-24 DIAGNOSIS — I2699 Other pulmonary embolism without acute cor pulmonale: Secondary | ICD-10-CM

## 2012-07-24 DIAGNOSIS — Z7902 Long term (current) use of antithrombotics/antiplatelets: Secondary | ICD-10-CM

## 2012-07-24 NOTE — Progress Notes (Signed)
Anti-Coagulation Progress Note  [name] [name] is a 78 y.o. male who is currently on an anti-coagulation regimen.    RECENT RESULTS: Recent results are below, the most recent result is correlated with a dose of 40 mg. per week: Lab Results  Component Value Date   INR 3.00 07/24/2012   INR 2.90 06/26/2012   INR 2.60 05/29/2012    ANTI-COAG DOSE:   Latest dosing instructions   Total Sun Mon Tue Wed Thu Fri Sat   35 5 mg 5 mg 5 mg 5 mg 5 mg 5 mg 5 mg    (5 mg1) (5 mg1) (5 mg1) (5 mg1) (5 mg1) (5 mg1) (5 mg1)         ANTICOAG SUMMARY: Anticoagulation Episode Summary              Current INR goal 2.0-3.0 Next INR check 08/21/2012   INR from last check 3.00 (07/24/2012)     Weekly max dose (mg)  Target end date Indefinite   Indications Pulmonary embolism [415.19], Encounter for long-term use of antiplatelets/antithrombotics [V58.83, V58.63]   [facility] location [facility]    Send INR reminders to [facility]   Comments History of multiple venous embolic episodes. Will continue to annual re-evaluate continued need for warfarin weighing risks vs. benefits.        Provider Role Specialty Phone number   Farley Ly, MD  Internal Medicine [phone]        ANTICOAG TODAY: Anticoagulation Summary as of 07/24/2012              INR goal 2.0-3.0     Selected INR 3.00 (07/24/2012) Next INR check 08/21/2012   Weekly max dose (mg)  Target end date Indefinite   Indications Pulmonary embolism [415.19], Encounter for long-term use of antiplatelets/antithrombotics [V58.83, V58.63]    Anticoagulation Episode Summary              INR check location [facility]    Send INR reminders to [facility]   Comments History of multiple venous embolic episodes. Will continue to annual re-evaluate continued need for warfarin weighing risks vs. benefits.        Provider Role Specialty Phone number   Farley Ly, MD  Internal Medicine [phone]         PATIENT INSTRUCTIONS: Patient Instructions  Patient instructed to take medications as defined in the Anti-coagulation Track section of this encounter.  Patient instructed to take today's dose.  Patient verbalized understanding of these instructions.        FOLLOW-UP Return in 4 weeks (on 08/21/2012) for Follow up INR at 1130h.  [name], [name] Pharm.D., CACP

## 2012-07-24 NOTE — Patient Instructions (Signed)
Patient instructed to take medications as defined in the Anti-coagulation Track section of this encounter.  Patient instructed to take today's dose.  Patient verbalized understanding of these instructions.    

## 2012-08-21 ENCOUNTER — Ambulatory Visit (INDEPENDENT_AMBULATORY_CARE_PROVIDER_SITE_OTHER): Payer: Medicare Other | Admitting: Pharmacist

## 2012-08-21 DIAGNOSIS — Z7901 Long term (current) use of anticoagulants: Secondary | ICD-10-CM

## 2012-08-21 DIAGNOSIS — Z7902 Long term (current) use of antithrombotics/antiplatelets: Secondary | ICD-10-CM

## 2012-08-21 DIAGNOSIS — I2699 Other pulmonary embolism without acute cor pulmonale: Secondary | ICD-10-CM

## 2012-08-21 NOTE — Progress Notes (Signed)
Anti-Coagulation Progress Note  Larry Bartos is a 78 y.o. male who is currently on an anti-coagulation regimen.    RECENT RESULTS: Recent results are below, the most recent result is correlated with a dose of 35 mg. per week: Lab Results  Component Value Date   INR 2.00 08/21/2012   INR 3.00 07/24/2012   INR 2.90 06/26/2012    ANTI-COAG DOSE:   Latest dosing instructions   Total Sun Mon Tue Wed Thu Fri Sat   40 5 mg 7.5 mg 5 mg 5 mg 7.5 mg 5 mg 5 mg    (5 mg1) (5 mg1.5) (5 mg1) (5 mg1) (5 mg1.5) (5 mg1) (5 mg1)         ANTICOAG SUMMARY: Anticoagulation Episode Summary              Current INR goal 2.0-3.0 Next INR check 09/11/2012   INR from last check 2.00 (08/21/2012)     Weekly max dose (mg)  Target end date Indefinite   Indications Pulmonary embolism [415.19], Encounter for long-term use of antiplatelets/antithrombotics [V58.83, V58.63]   [facility] check location [facility]    Send INR reminders to [facility]   Comments History of multiple venous embolic episodes. Will continue to annual re-evaluate continued need for warfarin weighing risks vs. benefits.        Provider Role Specialty Phone number   Farley Ly, MD  Internal Medicine [phone]        ANTICOAG TODAY: Anticoagulation Summary as of 08/21/2012              INR goal 2.0-3.0     Selected INR 2.00 (08/21/2012) Next INR check 09/11/2012   Weekly max dose (mg)  Target end date Indefinite   Indications Pulmonary embolism [415.19], Encounter for long-term use of antiplatelets/antithrombotics [V58.83, V58.63]    Anticoagulation Episode Summary              [facility] check location [facility]    Send INR reminders to [facility]   Comments History of multiple venous embolic episodes. Will continue to annual re-evaluate continued need for warfarin weighing risks vs. benefits.        Provider Role Specialty Phone number   Farley Ly, MD  Internal Medicine  [phone]        PATIENT INSTRUCTIONS: Patient Instructions  Patient instructed to take medications as defined in the Anti-coagulation Track section of this encounter.  Patient instructed to take today's dose.  Patient verbalized understanding of these instructions.        FOLLOW-UP Return in 3 weeks (on 09/11/2012) for Follow up INR at 1100h.  [name], [name] Pharm.D., CACP

## 2012-08-21 NOTE — Progress Notes (Signed)
Agree with Dr. Groce's Plan. 

## 2012-08-21 NOTE — Patient Instructions (Signed)
Patient instructed to take medications as defined in the Anti-coagulation Track section of this encounter.  Patient instructed to take today's dose.  Patient verbalized understanding of these instructions.    

## 2012-09-11 ENCOUNTER — Ambulatory Visit: Payer: Medicare Other

## 2012-09-18 ENCOUNTER — Other Ambulatory Visit: Payer: Self-pay | Admitting: Internal Medicine

## 2012-09-25 ENCOUNTER — Ambulatory Visit (INDEPENDENT_AMBULATORY_CARE_PROVIDER_SITE_OTHER): Payer: Medicare Other | Admitting: Pharmacist

## 2012-09-25 DIAGNOSIS — Z7901 Long term (current) use of anticoagulants: Secondary | ICD-10-CM

## 2012-09-25 DIAGNOSIS — Z7902 Long term (current) use of antithrombotics/antiplatelets: Secondary | ICD-10-CM

## 2012-09-25 DIAGNOSIS — I2699 Other pulmonary embolism without acute cor pulmonale: Secondary | ICD-10-CM

## 2012-09-25 NOTE — Patient Instructions (Signed)
Patient instructed to take medications as defined in the Anti-coagulation Track section of this encounter.  Patient instructed to take today's dose.  Patient verbalized understanding of these instructions.    

## 2012-09-25 NOTE — Progress Notes (Signed)
Anti-Coagulation Progress Note  [name] [name] is a 79 y.o. male who is currently on an anti-coagulation regimen.    RECENT RESULTS: Recent results are below, the most recent result is correlated with a dose of 40 mg. per week: Lab Results  Component Value Date   INR 2.0 09/25/2012   INR 2.00 08/21/2012   INR 3.00 07/24/2012    ANTI-COAG DOSE:   Latest dosing instructions   Total Sun Mon Tue Wed Thu Fri Sat   42.5 5 mg 7.5 mg 5 mg 7.5 mg 5 mg 7.5 mg 5 mg    (5 mg1) (5 mg1.5) (5 mg1) (5 mg1.5) (5 mg1) (5 mg1.5) (5 mg1)         ANTICOAG SUMMARY: Anticoagulation Episode Summary              Current INR goal 2.0-3.0 Next INR check 10/23/2012   INR from last check 2.0 (09/25/2012)     Weekly max dose (mg)  Target end date Indefinite   Indications Pulmonary embolism [415.19], Encounter for long-term use of antiplatelets/antithrombotics [V58.83, V58.63]   [facility]R check location [facility]    Send INR reminders to [facility]   Comments History of multiple venous embolic episodes. Will continue to annual re-evaluate continued need for warfarin weighing risks vs. benefits.        Provider Role Specialty Phone number   Farley Ly, MD  Internal Medicine [phone]        ANTICOAG TODAY: Anticoagulation Summary as of 09/25/2012              INR goal 2.0-3.0     Selected INR 2.0 (09/25/2012) Next INR check 10/23/2012   Weekly max dose (mg)  Target end date Indefinite   Indications Pulmonary embolism [415.19], Encounter for long-term use of antiplatelets/antithrombotics [V58.83, V58.63]    Anticoagulation Episode Summary              [facility] check location [facility]    Send INR reminders to [facility]   Comments History of multiple venous embolic episodes. Will continue to annual re-evaluate continued need for warfarin weighing risks vs. benefits.        Provider Role Specialty Phone number   Farley Ly, MD  Internal Medicine  [phone]        PATIENT INSTRUCTIONS: Patient Instructions  Patient instructed to take medications as defined in the Anti-coagulation Track section of this encounter.  Patient instructed to take today's dose.  Patient verbalized understanding of these instructions.        FOLLOW-UP Return in about 4 weeks (around 10/23/2012) for Follow up INR at 1100h.  [name], [name] Pharm.D., CACP

## 2012-10-07 ENCOUNTER — Other Ambulatory Visit: Payer: Self-pay | Admitting: Internal Medicine

## 2012-10-22 ENCOUNTER — Other Ambulatory Visit: Payer: Self-pay | Admitting: Internal Medicine

## 2012-10-23 ENCOUNTER — Ambulatory Visit (HOSPITAL_COMMUNITY)
Admission: RE | Admit: 2012-10-23 | Discharge: 2012-10-23 | Disposition: A | Discharge status: Home / Self Care | Payer: Medicare Other | Source: Ambulatory Visit | Attending: Internal Medicine | Admitting: Internal Medicine

## 2012-10-23 ENCOUNTER — Encounter: Payer: Self-pay | Admitting: Internal Medicine

## 2012-10-23 ENCOUNTER — Ambulatory Visit (INDEPENDENT_AMBULATORY_CARE_PROVIDER_SITE_OTHER): Payer: Medicare Other | Admitting: Pharmacist

## 2012-10-23 ENCOUNTER — Other Ambulatory Visit: Payer: Self-pay | Admitting: Internal Medicine

## 2012-10-23 ENCOUNTER — Ambulatory Visit (INDEPENDENT_AMBULATORY_CARE_PROVIDER_SITE_OTHER): Payer: Medicare Other | Admitting: Internal Medicine

## 2012-10-23 VITALS — BP 106/75 | HR 57 | Temp 97.8°F | Ht 68.0 in | Wt 161.5 lb

## 2012-10-23 DIAGNOSIS — J209 Acute bronchitis, unspecified: Secondary | ICD-10-CM | POA: Insufficient documentation

## 2012-10-23 DIAGNOSIS — R059 Cough, unspecified: Secondary | ICD-10-CM | POA: Insufficient documentation

## 2012-10-23 DIAGNOSIS — R05 Cough: Secondary | ICD-10-CM | POA: Insufficient documentation

## 2012-10-23 DIAGNOSIS — I2699 Other pulmonary embolism without acute cor pulmonale: Secondary | ICD-10-CM

## 2012-10-23 DIAGNOSIS — R079 Chest pain, unspecified: Secondary | ICD-10-CM | POA: Insufficient documentation

## 2012-10-23 DIAGNOSIS — J189 Pneumonia, unspecified organism: Secondary | ICD-10-CM

## 2012-10-23 DIAGNOSIS — Z7901 Long term (current) use of anticoagulants: Secondary | ICD-10-CM

## 2012-10-23 DIAGNOSIS — Z7902 Long term (current) use of antithrombotics/antiplatelets: Secondary | ICD-10-CM

## 2012-10-23 LAB — CBC
Hemoglobin: 12.1 g/dL — ABNORMAL LOW (ref 13.0–17.0)
MCH: 30 pg (ref 26.0–34.0)
MCV: 91.3 fL (ref 78.0–100.0)
RBC: 4.03 MIL/uL — ABNORMAL LOW (ref 4.22–5.81)

## 2012-10-23 LAB — URINALYSIS, ROUTINE W REFLEX MICROSCOPIC
Bilirubin Urine: NEGATIVE
Glucose, UA: NEGATIVE mg/dL
Hgb urine dipstick: NEGATIVE
Protein, ur: NEGATIVE mg/dL
Urobilinogen, UA: 0.2 mg/dL (ref 0.0–1.0)

## 2012-10-23 LAB — BASIC METABOLIC PANEL
CO2: 27 mEq/L (ref 19–32)
Calcium: 9.7 mg/dL (ref 8.4–10.5)
Creat: 1.21 mg/dL (ref 0.50–1.35)
Glucose, Bld: 97 mg/dL (ref 70–99)
Sodium: 144 mEq/L (ref 135–145)

## 2012-10-23 LAB — URINALYSIS, MICROSCOPIC ONLY

## 2012-10-23 MED ORDER — ACETAMINOPHEN 325 MG PO TABS: 325.0000 mg | ORAL_TABLET | ORAL | Status: DC | PRN

## 2012-10-23 NOTE — Patient Instructions (Signed)
I will call you in case we find anything on chest x ray  Otherwise the rest of the tests looked good.

## 2012-10-23 NOTE — Progress Notes (Signed)
Anti-Coagulation Progress Note  [name] [name] is a 79 y.o. male who is currently on an anti-coagulation regimen.    RECENT RESULTS: Recent results are below, the most recent result is correlated with a dose of 42.5 mg. per week: Lab Results  Component Value Date   INR 2.40 10/23/2012   INR 2.0 09/25/2012   INR 2.00 08/21/2012    ANTI-COAG DOSE:   Latest dosing instructions   Total Sun Mon Tue Wed Thu Fri Sat   42.5 5 mg 7.5 mg 5 mg 7.5 mg 5 mg 7.5 mg 5 mg    (5 mg1) (5 mg1.5) (5 mg1) (5 mg1.5) (5 mg1) (5 mg1.5) (5 mg1)         ANTICOAG SUMMARY: Anticoagulation Episode Summary              Current INR goal 2.0-3.0 Next INR check 11/20/2012   INR from last check 2.40 (10/23/2012)     Weekly max dose (mg)  Target end date Indefinite   Indications Pulmonary embolism [415.19], Encounter for long-term use of antiplatelets/antithrombotics [V58.83, V58.63]   [facility] check location [facility]    Send INR reminders to [facility]   Comments History of multiple venous embolic episodes. Will continue to annual re-evaluate continued need for warfarin weighing risks vs. benefits.        Provider Role Specialty Phone number   Farley Ly, MD  Internal Medicine [phone]        ANTICOAG TODAY: Anticoagulation Summary as of 10/23/2012              INR goal 2.0-3.0     Selected INR 2.40 (10/23/2012) Next INR check 11/20/2012   Weekly max dose (mg)  Target end date Indefinite   Indications Pulmonary embolism [415.19], Encounter for long-term use of antiplatelets/antithrombotics [V58.83, V58.63]    Anticoagulation Episode Summary              [facility] check location [facility]    Send INR reminders to [facility]   Comments History of multiple venous embolic episodes. Will continue to annual re-evaluate continued need for warfarin weighing risks vs. benefits.        Provider Role Specialty Phone number   Farley Ly, MD  Internal Medicine  [phone]        PATIENT INSTRUCTIONS: Patient Instructions  Patient instructed to take medications as defined in the Anti-coagulation Track section of this encounter.  Patient instructed to take today's dose.  Patient verbalized understanding of these instructions.        FOLLOW-UP Return in 4 weeks (on 11/20/2012) for Follow up INR at 1115h.  [name], [name] Pharm.D., CACP

## 2012-10-23 NOTE — Patient Instructions (Signed)
Patient instructed to take medications as defined in the Anti-coagulation Track section of this encounter.  Patient instructed to take today's dose.  Patient verbalized understanding of these instructions.    

## 2012-10-23 NOTE — Progress Notes (Signed)
Patient ID: [name] [name], male   DOB: Oct 30, 1930, 79 y.o.   MRN: [id number]  Subjective:   Patient ID: [name] [name] male   DOB: Jun 26, 1931 79 y.o.   MRN: [id number]  HPI: [name] [name] is a 79 y.o. with past medical history of recurrent PEs, and on chronic Coumadin, hypertension, depression, and osteoarthritis, who presents to the clinic today with cough, and chest pain for over 2 weeks.   He reports that he has not been feeling well over the last 2 weeks with cough, productive of clear sputum, without any blood, however, associated with bilateral chest pain increased on coughing and deep breaths. He describes his chest pain as being sharp, located under his ribs. When he coughs the pain increases from 0 to 5/10. He denies shortness of breath. He denies history of wheezing. He denies history of fevers or chills. However, he reports that his energy has been low during that time. His appetite has been good. He denies history of nasal congestion, postnasal drip, myalgias, however, he continues to experience pains in his multiple joints. He has no history of recent sick contacts.   Past Medical History  Diagnosis Date  . PE (pulmonary embolism)     unprovoked PE completed 6 months of warfarin, warfarin d/ced 02/12/2010, repeat PE 07/10/2010 after a long car ride and now on coumadin.  [name] Hypertension   . CVA (cerebral vascular accident)     MRI 12/10 negative except small vessel changes, CT 12/2009 old R thalamic infarct  . Pituitary microadenoma     incidental finding CT 12/2009  . Cancer   . Prostate cancer   . PE (pulmonary embolism)   . Depression    Current Outpatient Prescriptions  Medication Sig Dispense Refill  . ALPRAZolam (XANAX) 0.5 MG tablet Take 0.5 mg by mouth 2 (two) times daily as needed. For anxiety      . amLODipine (NORVASC) 10 MG tablet Take 10 mg by mouth daily.      [name] aspirin 81 MG tablet Take 81 mg by mouth daily.        [name] lisinopril (PRINIVIL,ZESTRIL) 40 MG tablet  TAKE 1 TABLET BY MOUTH ONCE DAILY  30 tablet  3  . omeprazole (PRILOSEC) 20 MG capsule Take 1 capsule (20 mg total) by mouth daily.  30 capsule  3  . rosuvastatin (CRESTOR) 20 MG tablet Take 1 tablet (20 mg total) by mouth at bedtime.  30 tablet  4  . warfarin (COUMADIN) 5 MG tablet FOLLOW DOSING DIRCTIONS FROM LATEST OFFICE VISIT  45 tablet  0  . acetaminophen (TYLENOL) 325 MG tablet Take 1 tablet (325 mg total) by mouth every 4 (four) hours as needed for pain.  30 tablet  2   Family History  Problem Relation Age of Onset  . Hypertension Father     Passed away from cerebral hemorrhage at age of 35.  [name] Dementia Mother     Passed away  . Hypertension Child     4 adult children   History   Social History  . Marital Status: Married    Spouse Name: N/A    Number of Children: N/A  . Years of Education: N/A   Occupational History  .      retired   Social History Main Topics  . Smoking status: Never Smoker   . Smokeless tobacco: None  . Alcohol Use: No  . Drug Use: No  . Sexually Active: No   Other Topics Concern  . None  Social History Narrative   Lives with his wife who has dementia at their home.  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  HEENT: Denies photophobia, eye pain, redness, hearing loss, ear pain, congestion, sore throat, rhinorrhea, sneezing, mouth sores, trouble swallowing, neck pain, neck stiffness and tinnitus.   Cardiovascular: Denies chest pain, palpitations and leg swelling.  Gastrointestinal: Denies nausea, vomiting, abdominal pain, diarrhea, constipation, blood in stool and abdominal distention.  Genitourinary: Denies dysuria, urgency, frequency, hematuria, flank pain and difficulty urinating.  Musculoskeletal: Denies myalgias, back pain,positive for joint pains.  Neurological: Denies dizziness, seizures, syncope, weakness, light-headedness, numbness and headaches.   Hematological: Denies adenopathy. Easy bruising, personal or family bleeding history  Psychiatric/Behavioral: Denies suicidal ideation, mood changes, confusion, nervousness, sleep disturbance and agitation  Objective:  Physical Exam: Filed Vitals:   10/23/12 1330  BP: 106/75  Pulse: 57  Temp: 97.8 F (36.6 C)  TempSrc: Oral  Height: 5\' 8"  (1.727 m)  Weight: 161 lb 8 oz (73.256 kg)  SpO2: 96%   Constitutional: Vital signs reviewed.  Patient is a well-developed and well-nourished in no acute distress and cooperative with exam. Alert and oriented x3.  Head: Normocephalic and atraumatic. No sinus tenderness. Ear: TM normal bilaterally Mouth: no erythema or exudates, MMM Eyes: PERRL, EOMI, conjunctivae normal, No scleral icterus.  Neck: Supple, Trachea midline normal ROM, No JVD, mass, thyromegaly, or carotid bruit present.  Cardiovascular: RRR, S1 normal, S2 normal, no MRG, pulses symmetric and intact bilaterally Pulmonary/Chest: CTAB, no wheezes, rales, or rhonchi Abdominal: Soft. Non-tender, non-distended, bowel sounds are normal, no masses, organomegaly, or guarding present.  GU: no CVA tenderness Musculoskeletal: No joint deformities, erythema, or stiffness, ROM full and no nontender Hematology: no cervical, inginal, or axillary adenopathy.  Neurological: A&O x3, Strength is normal and symmetric bilaterally, cranial nerve II-XII are grossly intact, no focal motor deficit, sensory intact to light touch bilaterally.  Skin: Warm, dry and intact. No rash, cyanosis, or clubbing.  Psychiatric: Normal mood and affect. speech and behavior is normal. Judgment and thought content normal. Cognition and memory are normal.   Assessment & Plan:  I have discussed the assessment and plan for the care of [name] [name], with Dr. Criselda Peaches, as detailed under in problem based charting.  A stat CBC indicates white blood cell count of 4.1. Most likely [name] [name], has a bronchitis. However, acquired  pneumonia will need further evaluation with a chest x-ray.  I will call him with the results on the phone number [phone].

## 2012-10-23 NOTE — Assessment & Plan Note (Signed)
[name] [name] presentation with history of a cough and chest pain, clear sputum production for 2 weeks is consistent with the acute bronchitis. His physical exam, including vital signs is unremarkable. No respiratory distress. He is saturating well at room air at 96%. His temperature is 97.8, and the pulse rate of 57. I do not suspect that [name] [name] has a pneumonia at this point. White blood cell count is 4.1, which is slightly increased from his baseline of 2.7-3.6. Plan. -Given his advanced age, I have proceeded and ordered a chest x-ray to rule out community-acquired pneumonia.. -I will contact him with results.

## 2012-10-24 NOTE — Progress Notes (Signed)
Agree 

## 2012-10-25 LAB — URINE CULTURE: Colony Count: NO GROWTH

## 2012-10-26 ENCOUNTER — Other Ambulatory Visit: Payer: Self-pay | Admitting: Internal Medicine

## 2012-11-20 ENCOUNTER — Ambulatory Visit: Payer: Medicare Other

## 2012-11-27 ENCOUNTER — Ambulatory Visit (INDEPENDENT_AMBULATORY_CARE_PROVIDER_SITE_OTHER): Payer: Medicare Other | Admitting: Pharmacist

## 2012-11-27 DIAGNOSIS — Z7901 Long term (current) use of anticoagulants: Secondary | ICD-10-CM

## 2012-11-27 DIAGNOSIS — Z5181 Encounter for therapeutic drug level monitoring: Secondary | ICD-10-CM

## 2012-11-27 DIAGNOSIS — Z7902 Long term (current) use of antithrombotics/antiplatelets: Secondary | ICD-10-CM

## 2012-11-27 DIAGNOSIS — I2699 Other pulmonary embolism without acute cor pulmonale: Secondary | ICD-10-CM

## 2012-11-27 DIAGNOSIS — Z86711 Personal history of pulmonary embolism: Secondary | ICD-10-CM

## 2012-11-27 LAB — POCT INR: INR: 2.4

## 2012-11-27 NOTE — Patient Instructions (Signed)
Patient instructed to take medications as defined in the Anti-coagulation Track section of this encounter.  Patient instructed to take today's dose.  Patient verbalized understanding of these instructions.    

## 2012-11-27 NOTE — Progress Notes (Signed)
Anti-Coagulation Progress Note  [name] [name] is a 80 y.o. male who is currently on an anti-coagulation regimen.    RECENT RESULTS: Recent results are below, the most recent result is correlated with a dose of 42.5 mg. per week: Lab Results  Component Value Date   INR 2.40 11/27/2012   INR 2.40 10/23/2012   INR 2.0 09/25/2012    ANTI-COAG DOSE: Anticoagulation Dose Instructions as of 11/27/2012     Glynis Smiles Tue Wed Thu Fri Sat   New Dose 5 mg 7.5 mg 5 mg 7.5 mg 5 mg 7.5 mg 5 mg       ANTICOAG SUMMARY: Anticoagulation Episode Summary   Current INR goal 2.0-3.0  Next INR check 12/25/2012  INR from last check 2.40 (11/27/2012)  Weekly max dose   Target end date Indefinite  [facility]  Preferred lab   Send INR reminders to [facility]AG [facility]   Indications  Pulmonary embolism [415.19] Encounter for long-term use of antiplatelets/antithrombotics [V58.83 V58.63]        Comments History of multiple venous embolic episodes. Will continue to annual re-evaluate continued need for warfarin weighing risks vs. benefits.       Anticoagulation Care Providers   Provider Role Specialty Phone number   Farley Ly, MD  Internal Medicine [phone]      ANTICOAG TODAY: Anticoagulation Summary as of 11/27/2012   INR goal 2.0-3.0  Selected INR 2.40 (11/27/2012)  Next INR check 12/25/2012  Target end date Indefinite   Indications  Pulmonary embolism [415.19] Encounter for long-term use of antiplatelets/antithrombotics [V58.83 V58.63]      Anticoagulation Episode Summary   [facility]   Preferred lab    Send INR reminders to ANTICOAG [facility]   Comments History of multiple venous embolic episodes. Will continue to annual re-evaluate continued need for warfarin weighing risks vs. benefits.     Anticoagulation Care Providers   Provider Role Specialty Phone number   Farley Ly, MD  Internal Medicine [phone]      PATIENT  INSTRUCTIONS: Patient Instructions  Patient instructed to take medications as defined in the Anti-coagulation Track section of this encounter.  Patient instructed to take today's dose.  Patient verbalized understanding of these instructions.       FOLLOW-UP Return in 4 weeks (on 12/25/2012) for Follow up INR at 1130h.  [name], [name] Pharm.D., CACP

## 2012-12-06 ENCOUNTER — Other Ambulatory Visit: Payer: Self-pay | Admitting: Internal Medicine

## 2012-12-25 ENCOUNTER — Ambulatory Visit: Payer: Medicare Other

## 2013-01-01 ENCOUNTER — Telehealth: Payer: Self-pay | Admitting: *Deleted

## 2013-01-01 ENCOUNTER — Ambulatory Visit (INDEPENDENT_AMBULATORY_CARE_PROVIDER_SITE_OTHER): Payer: Medicare Other | Admitting: Pharmacist

## 2013-01-01 DIAGNOSIS — Z7901 Long term (current) use of anticoagulants: Secondary | ICD-10-CM

## 2013-01-01 DIAGNOSIS — Z7902 Long term (current) use of antithrombotics/antiplatelets: Secondary | ICD-10-CM

## 2013-01-01 DIAGNOSIS — Z86711 Personal history of pulmonary embolism: Secondary | ICD-10-CM

## 2013-01-01 DIAGNOSIS — I2699 Other pulmonary embolism without acute cor pulmonale: Secondary | ICD-10-CM

## 2013-01-01 DIAGNOSIS — Z5181 Encounter for therapeutic drug level monitoring: Secondary | ICD-10-CM

## 2013-01-01 LAB — POCT INR: INR: 2.5

## 2013-01-01 NOTE — Telephone Encounter (Signed)
Patient daughter Sue Lush called and stated her father gait is unsteady also his right leg wont move from time to time. Patient daughter also states his memory is starting to get bad. He cant recognize his wife or daughter at times.  Patient daughter states she will wait to be seen as long as her father has an appointment. Patient daughter stated when he was seen that they choose to wait before medication and believe he needs some she feels like this would save his memory.

## 2013-01-01 NOTE — Progress Notes (Signed)
Anti-Coagulation Progress Note  [name] [name] is a 79 y.o. male who is currently on an anti-coagulation regimen.    RECENT RESULTS: Recent results are below, the most recent result is correlated with a dose of 42.5 mg. per week: Lab Results  Component Value Date   INR 2.50 01/01/2013   INR 2.40 11/27/2012   INR 2.40 10/23/2012    ANTI-COAG DOSE: Anticoagulation Dose Instructions as of 01/01/2013     Glynis Smiles Tue Wed Thu Fri Sat   New Dose 5 mg 7.5 mg 5 mg 7.5 mg 5 mg 7.5 mg 5 mg       ANTICOAG SUMMARY: Anticoagulation Episode Summary   Current INR goal 2.0-3.0  Next INR check 01/29/2013  INR from last check 2.50 (01/01/2013)  Weekly max dose   Target end date Indefinite  [facility]  Preferred lab   Send INR reminders to [facility]   Indications  Pulmonary embolism [415.19] Encounter for long-term use of antiplatelets/antithrombotics [V58.83 V58.63]        Comments History of multiple venous embolic episodes. Will continue to annual re-evaluate continued need for warfarin weighing risks vs. benefits.       Anticoagulation Care Providers   Provider Role Specialty Phone number   Farley Ly, MD  Internal Medicine [phone]      ANTICOAG TODAY: Anticoagulation Summary as of 01/01/2013   INR goal 2.0-3.0  Selected INR 2.50 (01/01/2013)  Next INR check 01/29/2013  Target end date Indefinite   Indications  Pulmonary embolism [415.19] Encounter for long-term use of antiplatelets/antithrombotics [V58.83 V58.63]      Anticoagulation Episode Summary   [facility]   Preferred lab    Send INR reminders to [facility]   Comments History of multiple venous embolic episodes. Will continue to annual re-evaluate continued need for warfarin weighing risks vs. benefits.     Anticoagulation Care Providers   Provider Role Specialty Phone number   Farley Ly, MD  Internal Medicine [phone]      PATIENT  INSTRUCTIONS: Patient Instructions  Patient instructed to take medications as defined in the Anti-coagulation Track section of this encounter.  Patient instructed to take today's dose.  Patient verbalized understanding of these instructions.       FOLLOW-UP Return in 4 weeks (on 01/29/2013) for Follow up INR at 3PM.  [name], [name] Pharm.D., CACP

## 2013-01-01 NOTE — Patient Instructions (Signed)
Patient instructed to take medications as defined in the Anti-coagulation Track section of this encounter.  Patient instructed to take today's dose.  Patient verbalized understanding of these instructions.    

## 2013-01-08 NOTE — Progress Notes (Signed)
I have reviewed the note by Dr. Alexandria Lodge and agree with his documented plan.  Indication for anticoagulation is PE.

## 2013-01-10 ENCOUNTER — Other Ambulatory Visit: Payer: Self-pay | Admitting: Internal Medicine

## 2013-01-10 ENCOUNTER — Telehealth: Payer: Self-pay | Admitting: Pharmacist

## 2013-01-10 ENCOUNTER — Telehealth: Payer: Self-pay | Admitting: *Deleted

## 2013-01-10 MED ORDER — WARFARIN SODIUM 5 MG PO TABS: ORAL_TABLET | ORAL | Status: DC

## 2013-01-10 NOTE — Telephone Encounter (Signed)
Message copied by Monico Blitz on Wed Jan 10, 2013 12:38 PM ------      Message from: Philipp Ovens R      Created: Wed Jan 10, 2013 11:36 AM      Contact: dtr       [phone] dtr still waiting for callback from last week from nurse about her father ------

## 2013-01-10 NOTE — Telephone Encounter (Signed)
Have placed order for attending physician to authorize for:  Warfarin 5mg  #34; Sig:  Take as directed by anticoagulation clinic provider. Currently taking 1&1/2 tablets on Monday/Wednesday/Friday. 1 tablet all other days. Pharmacy of record has changed to [facility] patient is now living with daughter and this is a closer pharmacy.

## 2013-01-10 NOTE — Telephone Encounter (Signed)
Make f/u appointment with me and NP

## 2013-01-13 ENCOUNTER — Other Ambulatory Visit: Payer: Self-pay | Admitting: Internal Medicine

## 2013-01-16 ENCOUNTER — Telehealth: Payer: Self-pay | Admitting: *Deleted

## 2013-01-16 NOTE — Telephone Encounter (Signed)
Has recently moved w/ wife to daughter's home, he is having some confusion, general malaise and penile bleeding all within one to two weeks. Daughter is concerned, no appts til Friday, scheduled for fri, ask to call wed and thurs in am for cancellations and reschedule, she is agreeable. appt fri dr Loistine Chance

## 2013-01-19 ENCOUNTER — Encounter: Payer: Self-pay | Admitting: Internal Medicine

## 2013-01-19 ENCOUNTER — Ambulatory Visit (INDEPENDENT_AMBULATORY_CARE_PROVIDER_SITE_OTHER): Payer: Medicare Other | Admitting: Internal Medicine

## 2013-01-19 VITALS — BP 134/86 | HR 67 | Temp 97.3°F | Ht 67.5 in | Wt 160.7 lb

## 2013-01-19 DIAGNOSIS — G4700 Insomnia, unspecified: Secondary | ICD-10-CM

## 2013-01-19 DIAGNOSIS — R410 Disorientation, unspecified: Secondary | ICD-10-CM | POA: Insufficient documentation

## 2013-01-19 DIAGNOSIS — F29 Unspecified psychosis not due to a substance or known physiological condition: Secondary | ICD-10-CM

## 2013-01-19 DIAGNOSIS — I10 Essential (primary) hypertension: Secondary | ICD-10-CM

## 2013-01-19 DIAGNOSIS — Z7901 Long term (current) use of anticoagulants: Secondary | ICD-10-CM

## 2013-01-19 DIAGNOSIS — N4889 Other specified disorders of penis: Secondary | ICD-10-CM

## 2013-01-19 DIAGNOSIS — R310 Gross hematuria: Secondary | ICD-10-CM | POA: Insufficient documentation

## 2013-01-19 DIAGNOSIS — N501 Vascular disorders of male genital organs: Secondary | ICD-10-CM

## 2013-01-19 DIAGNOSIS — R5383 Other fatigue: Secondary | ICD-10-CM

## 2013-01-19 DIAGNOSIS — R5381 Other malaise: Secondary | ICD-10-CM

## 2013-01-19 LAB — CBC WITH DIFFERENTIAL/PLATELET
Basophils Absolute: 0 10*3/uL (ref 0.0–0.1)
Basophils Relative: 1 % (ref 0–1)
Eosinophils Absolute: 0.1 10*3/uL (ref 0.0–0.7)
Eosinophils Relative: 3 % (ref 0–5)
HCT: 37.1 % — ABNORMAL LOW (ref 39.0–52.0)
Hemoglobin: 12.6 g/dL — ABNORMAL LOW (ref 13.0–17.0)
Lymphocytes Relative: 33 % (ref 12–46)
Lymphs Abs: 1.1 10*3/uL (ref 0.7–4.0)
MCH: 29.7 pg (ref 26.0–34.0)
MCHC: 34 g/dL (ref 30.0–36.0)
MCV: 87.5 fL (ref 78.0–100.0)
Monocytes Absolute: 0.2 10*3/uL (ref 0.1–1.0)
Monocytes Relative: 7 % (ref 3–12)
Neutro Abs: 2 10*3/uL (ref 1.7–7.7)
Neutrophils Relative %: 56 % (ref 43–77)
Platelets: 152 10*3/uL (ref 150–400)
RBC: 4.24 MIL/uL (ref 4.22–5.81)
RDW: 15.5 % (ref 11.5–15.5)
WBC: 3.3 10*3/uL — ABNORMAL LOW (ref 4.0–10.5)

## 2013-01-19 LAB — BASIC METABOLIC PANEL
BUN: 15 mg/dL (ref 6–23)
Creat: 1.12 mg/dL (ref 0.50–1.35)
Glucose, Bld: 96 mg/dL (ref 70–99)

## 2013-01-19 LAB — POCT URINALYSIS DIPSTICK
Leukocytes, UA: NEGATIVE
Nitrite, UA: NEGATIVE
Protein, UA: NEGATIVE
Urobilinogen, UA: 0.2
pH, UA: 7

## 2013-01-19 LAB — POCT INR: INR: 2.1

## 2013-01-19 MED ORDER — MELATONIN 5 MG PO TABS: 5.0000 mg | ORAL_TABLET | Freq: Every day | ORAL | Status: DC

## 2013-01-19 NOTE — Patient Instructions (Addendum)
Please bring all your medication with you during the office visit.  Stop taking Tramadol, Ibuprofen, Xanax Will start Melatonin 5 mg daily at bed time for sleep

## 2013-01-19 NOTE — Progress Notes (Signed)
Subjective:   Patient ID: [name] [name] male   DOB: [birth year] 79 y.o.   MRN: [id number]  HPI: [name] [name] is a 79 y.o. male with past history significant as outlined below who presented to the clinic with episode of confusion, general malaise and canal bleeding restarted one to 2 weeks now.  1. Bleeding from penis : it started on Monday morning. Patient's daughter was present who reported that she noticed specks of blood on his underwear as above his head. Patient himself reports it is normal on clinical clots. Patient denies any blood in the urine or stool. He never experienced this before. Denies any trauma, redness in the pedal area. Denies any chest pain, shortness of breath, dizziness.  2. Tiredness: He is dragging himself especially in the morning. Gets up at 12 pm . Goes to bed at 9:30 pm . Recently moved from [place] to [place] ( 12/23/12) with his wife ( who has dementia)  to daughters place . Getting adjusted to everything. He would like to do furnituring in his garage but since he moved to his daughters place he does not do it anymore. Daughter is worried that he would cut himself with the machines and since he is on Coumadin he would have increased risk of bleeding. Patient reports little pleasure in his daily activities. Most of the days with sleep in her room and just read. He definitely needs something to fall asleep. He used to take care of his wife who is demented but currently the daughter is the main caretaker as well as his son. Daughter further noted that his appetite has been decreased. Patient denies any suicidal or homicidal ideation. It is unclear how the patient was prior to moving to [place]. If this is the new change or a gradually development.   3. Confusion: Last week: Patient had difficulties to remember things and remember people. It is worse at night. Denies any loss of consciousness. He still is able to perform all his ADLs. The patient was given tramadol  which seemed to worsen the confusion. Patient was evaluated at [facility] urgent care we are a CT head without contrast was obtained which showed moderate global volume loss. Moderate hemispheric white matter changes, most commonly secondary to chronic microvascular disease. Remote-appearing right thalamic infarct. Enlarged pituitary. Labs which were obtained showed : cemented unremarkable except AST of 40, TSH 0.7 6, BNP of 16, vitamin D 25-hydroxy 62, hemoglobin A1c 6.6  Patient was prescribed rozerem for sleep which made him somewhat confused and did not help. Therefore it was discontinued and patient continued to take alprazolam 0.5 mg at bed time    Past Medical History  Diagnosis Date  . PE (pulmonary embolism)     unprovoked PE completed 6 months of warfarin, warfarin d/ced 02/12/2010, repeat PE 07/10/2010 after a long car ride and now on coumadin.  [name] Hypertension   . CVA (cerebral vascular accident)     MRI 12/10 negative except small vessel changes, CT 12/2009 old R thalamic infarct  . Pituitary microadenoma     incidental finding CT 12/2009  . Cancer   . Prostate cancer   . PE (pulmonary embolism)   . Depression    Current Outpatient Prescriptions  Medication Sig Dispense Refill  . acetaminophen (TYLENOL) 325 MG tablet Take 1 tablet (325 mg total) by mouth every 4 (four) hours as needed for pain.  30 tablet  2  . ALPRAZolam (XANAX) 0.5 MG tablet Take 0.5 mg by mouth 2 (two)  times daily as needed. For anxiety      . amLODipine (NORVASC) 10 MG tablet Take 10 mg by mouth daily.      [name] aspirin 81 MG tablet Take 81 mg by mouth daily.        [name] atorvastatin (LIPITOR) 40 MG tablet TAKE 1 TABLET BY MOUTH EVERY DAY AS DIRECTED  30 tablet  3  . lisinopril (PRINIVIL,ZESTRIL) 40 MG tablet TAKE 1 TABLET BY MOUTH ONCE DAILY  30 tablet  3  . omeprazole (PRILOSEC) 20 MG capsule Take 1 capsule (20 mg total) by mouth daily.  30 capsule  3  . rosuvastatin (CRESTOR) 20 MG tablet Take 1 tablet (20  mg total) by mouth at bedtime.  30 tablet  4   Hydrochlorothiazide 12.5 mg  Take daily      Hydrocodone /Acetaminophen 5/500 mg       Tramadol 50 mg       Rozerem 8 mg  Take 1 tablet every day by mouth at bedtime 20 tablet    . warfarin (COUMADIN) 5 MG tablet Take as directed by anticoagulation clinic provider. Currently taking 1 & 1/2 tablets Monday,Wednesday, Friday. 1 tablet all other days.  34 tablet  1   No current facility-administered medications for this visit.   Family History  Problem Relation Age of Onset  . Hypertension Father     Passed away from cerebral hemorrhage at age of 78.  [name] Dementia Mother     Passed away  . Hypertension Child     4 adult children   History   Social History  . Marital Status: Married    Spouse Name: N/A    Number of Children: N/A  . Years of Education: N/A   Occupational History  .      retired   Social History Main Topics  . Smoking status: Never Smoker   . Smokeless tobacco: None  . Alcohol Use: No  . Drug Use: No  . Sexually Active: No   Other Topics Concern  . 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  HEENT: Denies eye pain,hearing loss,  trouble swallowing, neck pain.   Respiratory: Denies SOB, DOE, cough, chest tightness,  and wheezing.   Cardiovascular: Denies chest pain, palpitations and leg swelling.  Gastrointestinal: Denies nausea, vomiting, abdominal pain, diarrhea, constipation, blood in stool and abdominal distention.  Genitourinary: Denies dysuria, urgency, frequency, hematuria, flank pain and difficulty urinating.  Endocrine: Denies: hot or cold intolerance, sweats, changes in hair or nails, polyuria, polydipsia. Musculoskeletal: Denies myalgias, back pain, joint swelling, arthralgias and gait problem.  Skin: Denies pallor,  rash and wound.  Neurological: Denies dizziness, seizures, syncope, light-headedness and headaches.  Hematological: Denies adenopathy. Easy bruising, personal or family bleeding history  Psychiatric/Behavioral: Denies suicidal ideation but noted mood changes, confusion, nervousness, sleep disturbance and agitation  Objective:  Physical Exam: Filed Vitals:   01/19/13 1352  BP: 134/86  Pulse: 67  Temp: 97.3 F (36.3 C)  TempSrc: Oral  Height: 5' 7.5" (1.715 m)  Weight: 160 lb 11.2 oz (72.893 kg)  SpO2: 99%   Constitutional: Vital signs reviewed.  Patient is a well-developed and well-nourished male in no acute distress and cooperative with exam. Alert and oriented x3.  Head: Normocephalic and atraumatic Ear: TM normal bilaterally Mouth: no  erythema or exudates, MMM Eyes: PERRL, EOMI, conjunctivae normal, No scleral icterus.  Neck: Supple, Trachea midline normal ROM, No JVD, mass, thyromegaly, or carotid bruit present.  Cardiovascular: RRR, S1 normal, S2 normal, no MRG, pulses symmetric and intact bilaterally Pulmonary/Chest: CTAB, no wheezes, rales, or rhonchi Abdominal: Soft. Non-tender, non-distended, bowel sounds are normal, no masses, organomegaly, or guarding present.  GU: no CVA tenderness Rectal exam: No palpable mass. FOBT negative.  GU: no lesion, no erythema, no blood, no mass noted  Musculoskeletal: No joint deformities, erythema, or stiffness, ROM full and no nontender Hematology: no cervical, inginal adenopathy.  Neurological: A&O x3, Strength is normal and symmetric bilaterally, cranial nerve II-XII are grossly intact, no focal motor deficit, sensory intact to light touch bilaterally.Romberg negative. Finger to nose normal. difficulties with rapid alternative movement. No tremor noted.Slow gait but normal. No shuffling noted.Patient needed at least 4 steps to turn  Around otherwise stable gait. Skin: Warm, dry and intact. No rash, cyanosis, or clubbing.  Psychiatric:  Normal mood and affect. speech and behavior is normal. Judgment and thought content normal.

## 2013-01-20 DIAGNOSIS — R5383 Other fatigue: Secondary | ICD-10-CM | POA: Insufficient documentation

## 2013-01-20 NOTE — Assessment & Plan Note (Signed)
I believe the episodes of confusion is related to mild dementia along with changes in the environment after moving from his own home to his daughter's home. CT head and TSH are within normal limits. UA was negative for nitrates or leukocytes day for UTI likely. Other differential diagnoses include depression which need to be considered since it the patient had significant changes in environment. Vitamin B12 deficiency is another possibility as well as medication induced. I'll obtain vitamin B12 level. I discontinued tramadol, ibuprofen, Rozerem as well as Xanax. I will reevaluate patient in one week for further evaluation and management. I'll obtain a Mini-Mental test during that office visit.

## 2013-01-20 NOTE — Assessment & Plan Note (Signed)
Differential diagnosis include depression in the setting of environmental changes and possibly underlying dementia. TSH is was within normal limits 01/10/13. PHQ-9 score 15 which indicates major depression. Other differential diagnosis include anemia oral vitamin B12 deficiency.  I obtain CBC and Vitamin B12 today.I will reevaluate patient in one week.

## 2013-01-20 NOTE — Assessment & Plan Note (Signed)
The patient has been on and off on Xanax. Was tried to wean him off.  He was started on rozerem which did not help. I will give a trauma with melatonin. If this does not work we may have to just stick to Xanax 0.5 mg each bedtime

## 2013-01-20 NOTE — Assessment & Plan Note (Signed)
Unclear etiology. Urine dipstick showed only traces of blood. FOB test and physical exam unremarkable. INR was obtained which was 2.1. PSA was obtained on 01/13/13 at [facility]  at urgent care which was within normal limits. Patient had history of prostate cancer status post prostatectomy.has not been seen by urology recently. I will obtain a CBC today. Furthermore recommended the son and the daughter to continue to monitor her. If increased bleeding is noted when he have to refer patient to urology for further evaluation and management.

## 2013-01-25 ENCOUNTER — Ambulatory Visit (INDEPENDENT_AMBULATORY_CARE_PROVIDER_SITE_OTHER): Payer: Medicare Other | Admitting: Internal Medicine

## 2013-01-25 ENCOUNTER — Encounter: Payer: Self-pay | Admitting: Internal Medicine

## 2013-01-25 ENCOUNTER — Encounter: Payer: Self-pay | Admitting: Licensed Clinical Social Worker

## 2013-01-25 VITALS — BP 118/77 | HR 77 | Temp 98.9°F | Ht 67.5 in | Wt 159.7 lb

## 2013-01-25 DIAGNOSIS — F329 Major depressive disorder, single episode, unspecified: Secondary | ICD-10-CM | POA: Insufficient documentation

## 2013-01-25 DIAGNOSIS — G3184 Mild cognitive impairment, so stated: Secondary | ICD-10-CM

## 2013-01-25 MED ORDER — FLUOXETINE HCL 10 MG PO CAPS: 10.0000 mg | ORAL_CAPSULE | Freq: Every day | ORAL | Status: DC

## 2013-01-25 NOTE — Assessment & Plan Note (Signed)
I believe the episodes of tightness and confusion are a combination of depression and cognitive impairment. I obtained a Mini-Mental test which showed a score of 18 which would mean moderate cognitive impairment. He could not control the clock for me. The score severely abnormal in the setting of depression. Therefore I will start patient on fluoxetine 10 mg daily refer patient to psychotherapy along with senior activity programs. I will reevaluate patient in 4-6 weeks and repeat a Mini-Mental test at that time if his course remains around this time may consider to start donepezil.

## 2013-01-25 NOTE — Progress Notes (Signed)
Subjective:   Patient ID: [name] [name] male   DOB: 04-16-1931 79 y.o.   MRN: [id number]  HPI: [name] [name] is a 79 y.o. male with past medical history significant as outlined below who presented to the clinic for a followup. Patient was last seen for penile bleeding and confusion/tirdness  1. Penile bleeding: Urine analysis did not show any hematuria, CBC and INR were stable. Patient had no further recurrence of bleeding  2. patient continues to be tired and has episodes of confusion.    Past Medical History  Diagnosis Date  . PE (pulmonary embolism)     unprovoked PE completed 6 months of warfarin, warfarin d/ced 02/12/2010, repeat PE 07/10/2010 after a long car ride and now on coumadin.  [name] Hypertension   . CVA (cerebral vascular accident)     MRI 12/10 negative except small vessel changes, CT 12/2009 old R thalamic infarct  . Pituitary microadenoma     incidental finding CT 12/2009  . Cancer   . Prostate cancer   . PE (pulmonary embolism)   . Depression    Current Outpatient Prescriptions  Medication Sig Dispense Refill  . acetaminophen (TYLENOL) 325 MG tablet Take 1 tablet (325 mg total) by mouth every 4 (four) hours as needed for pain.  30 tablet  2  . amLODipine (NORVASC) 10 MG tablet Take 10 mg by mouth daily.      [name] aspirin 81 MG tablet Take 81 mg by mouth daily.        [name] atorvastatin (LIPITOR) 40 MG tablet TAKE 1 TABLET BY MOUTH EVERY DAY AS DIRECTED  30 tablet  3  . cholecalciferol (VITAMIN D) 1000 UNITS tablet Take 1,000 Units by mouth daily.      . hydrochlorothiazide (MICROZIDE) 12.5 MG capsule Take 12.5 mg by mouth daily.      [name] lisinopril (PRINIVIL,ZESTRIL) 40 MG tablet TAKE 1 TABLET BY MOUTH ONCE DAILY  30 tablet  3  . Melatonin 5 MG TABS Take 1 tablet (5 mg total) by mouth at bedtime.  30 tablet  0  . Multiple Vitamins-Minerals (MULTIVITAMIN WITH MINERALS) tablet Take 1 tablet by mouth daily.      [name] omeprazole (PRILOSEC) 20 MG capsule Take 1 capsule (20 mg  total) by mouth daily.  30 capsule  3  . pyridOXINE (VITAMIN B-6) 100 MG tablet Take 100 mg by mouth daily.      [name] warfarin (COUMADIN) 5 MG tablet Take as directed by anticoagulation clinic provider. Currently taking 1 & 1/2 tablets Monday,Wednesday, Friday. 1 tablet all other days.  34 tablet  1   No current facility-administered medications for this visit.   Family History  Problem Relation Age of Onset  . Hypertension Father     Passed away from cerebral hemorrhage at age of 109.  [name] Dementia Mother     Passed away  . Hypertension Child     4 adult children   History   Social History  . Marital Status: Married    Spouse Name: N/A    Number of Children: N/A  . Years of Education: N/A   Occupational History  .      retired   Social History Main Topics  . Smoking status: Never Smoker   . Smokeless tobacco: None  . Alcohol Use: No  . Drug Use: No  . Sexually Active: No   Other Topics Concern  . None   Social History Narrative   Lives with his wife who has dementia at  their home.  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    Respiratory: Denies SOB, DOE, cough, chest tightness,  and wheezing.   Cardiovascular: Denies chest pain, palpitations and leg swelling.  Gastrointestinal: Denies nausea, vomiting, abdominal pain, diarrhea, constipation, blood in stool and abdominal distention.  Genitourinary: Denies dysuria, urgency, frequency, hematuria, flank pain and difficulty urinating.  Musculoskeletal: Denies myalgias, back pain, joint swelling, arthralgias and gait problem.  Skin: Denies pallor, rash and wound.  Neurological: Denies dizziness,  syncope, weakness, light-headedness, numbness and headaches.  Psychiatric/Behavioral: Denies suicidal ideation,   Objective:  Physical Exam: Filed Vitals:   01/25/13 1120  BP: 118/77  Pulse: 77  Temp: 98.9 F (37.2 C)  TempSrc: Oral   Height: 5' 7.5" (1.715 m)  Weight: 159 lb 11.2 oz (72.439 kg)  SpO2: 100%   Constitutional: Vital signs reviewed.  Patient is a well-developed and well-nourished male in no acute distress and cooperative with exam. Alert and oriented x3.  Eyes: PERRL, EOMI, conjunctivae normal, No scleral icterus.  Neck: Supple,  Cardiovascular: RRR, S1 normal, S2 normal, no MRG, pulses symmetric and intact bilaterally Pulmonary/Chest: CTAB, no wheezes, rales, or rhonchi Abdominal: Soft. Non-tender, non-distended, bowel sounds are normal,  Hematology: no cervical,  adenopathy.  Neurological: A&O x2, Strength is normal and symmetric bilaterally, cranial nerve II-XII are grossly intact, no focal motor deficit, sensory intact to light touch bilaterally.  Skin: Warm, dry and intact. No rash, cyanosis, or clubbing.  Psychiatric: Decreased mood and affect. Speech slow and behavior is normal.

## 2013-01-25 NOTE — Progress Notes (Signed)
I discussed this case with Dr. Loistine Chance soon after the patient visit. I have read the documentation and I agree with the plan of care. Please see the resident note for details of management.

## 2013-01-26 NOTE — Progress Notes (Signed)
Mr. Demello was referred to CSW for referral to Adult Day Care and Psychotherapy.  CSW met with Mr. Coye and his son, whom he stays with on the weekends to provide respite to pt's daughter.  Mr. Crysler was the primary caregiver to his spouse who now son reports her as having advanced dementia.  Due to both Mr and Mrs Congrove's decline they recently moved into daughter's home.  Spouse receives CAP case management with daily aide provided M-F.  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  Mr. Prokop shows cognitive impairment with a MMSE score of 18.  CSW discussed options available to provide Mr. Raper with social outlets.  Adult day care is an option, but most likely not the best for Mr. Rahmani as he still shows desire to be independent. Pt enjoys walking in the neighborhood, CSW offered option to look into [employer] program as pt has a medicare replacement plan which may offer this program.  CSW offered suggestion with family where Mr and Mrs Kokesh could both attend a senior center/gym to walk about together.  Spouse has aide care during the day which would be able to assist with spouse.  CSW provided Mr. Holtsclaw with information on [facility] and programs available through the [employer].  Mr. Beeney was in agreement with psychotherapy, stating "anything to get me out of the house".  Son would like to try other options first ie: going gym, senior center as well as getting pt more involved in church activities.  Pt and son provided with CSW contact information.  CSW requesting family/pt to contact CSW when they desire information regarding psychotherapy.  CSW will sign off at this time.

## 2013-01-27 ENCOUNTER — Other Ambulatory Visit: Payer: Self-pay

## 2013-01-27 ENCOUNTER — Emergency Department (HOSPITAL_COMMUNITY)
Admission: EM | Admit: 2013-01-27 | Discharge: 2013-01-28 | Disposition: A | Discharge status: Home / Self Care | Payer: Medicare Other | Attending: Emergency Medicine | Admitting: Emergency Medicine

## 2013-01-27 ENCOUNTER — Encounter (HOSPITAL_COMMUNITY): Payer: Self-pay | Admitting: Emergency Medicine

## 2013-01-27 DIAGNOSIS — R4182 Altered mental status, unspecified: Secondary | ICD-10-CM | POA: Insufficient documentation

## 2013-01-27 DIAGNOSIS — I10 Essential (primary) hypertension: Secondary | ICD-10-CM | POA: Insufficient documentation

## 2013-01-27 DIAGNOSIS — Z86711 Personal history of pulmonary embolism: Secondary | ICD-10-CM | POA: Insufficient documentation

## 2013-01-27 DIAGNOSIS — Z79899 Other long term (current) drug therapy: Secondary | ICD-10-CM | POA: Insufficient documentation

## 2013-01-27 DIAGNOSIS — F3289 Other specified depressive episodes: Secondary | ICD-10-CM | POA: Insufficient documentation

## 2013-01-27 DIAGNOSIS — F329 Major depressive disorder, single episode, unspecified: Secondary | ICD-10-CM | POA: Insufficient documentation

## 2013-01-27 DIAGNOSIS — Z7901 Long term (current) use of anticoagulants: Secondary | ICD-10-CM | POA: Insufficient documentation

## 2013-01-27 DIAGNOSIS — R61 Generalized hyperhidrosis: Secondary | ICD-10-CM | POA: Insufficient documentation

## 2013-01-27 DIAGNOSIS — Z85858 Personal history of malignant neoplasm of other endocrine glands: Secondary | ICD-10-CM | POA: Insufficient documentation

## 2013-01-27 DIAGNOSIS — Z8546 Personal history of malignant neoplasm of prostate: Secondary | ICD-10-CM | POA: Insufficient documentation

## 2013-01-27 DIAGNOSIS — Z8673 Personal history of transient ischemic attack (TIA), and cerebral infarction without residual deficits: Secondary | ICD-10-CM | POA: Insufficient documentation

## 2013-01-27 LAB — COMPREHENSIVE METABOLIC PANEL
Albumin: 3.7 g/dL (ref 3.5–5.2)
Alkaline Phosphatase: 54 U/L (ref 39–117)
BUN: 18 mg/dL (ref 6–23)
Calcium: 8.9 mg/dL (ref 8.4–10.5)
GFR calc Af Amer: 47 mL/min — ABNORMAL LOW (ref >90)
Glucose, Bld: 107 mg/dL — ABNORMAL HIGH (ref 70–99)
Potassium: 3.6 mEq/L (ref 3.5–5.1)
Sodium: 141 mEq/L (ref 135–145)
Total Protein: 6.8 g/dL (ref 6.0–8.3)

## 2013-01-27 LAB — CBC
HCT: 33.8 % — ABNORMAL LOW (ref 39.0–52.0)
Hemoglobin: 11.3 g/dL — ABNORMAL LOW (ref 13.0–17.0)
MCH: 30 pg (ref 26.0–34.0)
MCHC: 33.4 g/dL (ref 30.0–36.0)
RDW: 16 % — ABNORMAL HIGH (ref 11.5–15.5)

## 2013-01-27 NOTE — ED Notes (Signed)
Pt from home, c/o lethargic slow to respond. Pt was seen at urgent care dx of tia on Thurs. Pt is alert but slow to answer, Per daughter pt was very diaphoretic this evening has decrease in appetite over last month.

## 2013-01-28 ENCOUNTER — Emergency Department (HOSPITAL_COMMUNITY): Payer: Medicare Other

## 2013-01-28 LAB — URINALYSIS, ROUTINE W REFLEX MICROSCOPIC
Ketones, ur: NEGATIVE mg/dL
Leukocytes, UA: NEGATIVE
Nitrite: NEGATIVE
Protein, ur: NEGATIVE mg/dL
Urobilinogen, UA: 0.2 mg/dL (ref 0.0–1.0)

## 2013-01-28 LAB — TROPONIN I: Troponin I: <0.3 ng/mL (ref <0.30)

## 2013-01-28 LAB — GLUCOSE, CAPILLARY: Glucose-Capillary: 97 mg/dL (ref 70–99)

## 2013-01-28 LAB — PROTIME-INR: Prothrombin Time: 21.5 seconds — ABNORMAL HIGH (ref 11.6–15.2)

## 2013-01-28 MED ORDER — SODIUM CHLORIDE 0.9 % IV SOLN
INTRAVENOUS | Status: DC
  Administered 2013-01-28: 03:00:00 via INTRAVENOUS

## 2013-01-28 MED ORDER — SODIUM CHLORIDE 0.9 % IV BOLUS (SEPSIS)
500.0000 mL | Freq: Once | INTRAVENOUS | Status: AC
  Administered 2013-01-28: 500 mL via INTRAVENOUS

## 2013-01-28 NOTE — ED Notes (Signed)
Oxygen applied by MD  Chauncey 2liters

## 2013-01-28 NOTE — ED Notes (Signed)
Report to Monica, RN.

## 2013-01-28 NOTE — ED Provider Notes (Signed)
History     CSN: [id number]  Arrival date & time 01/27/13  2250   First MD Initiated Contact with Patient 01/28/13 0036      Chief Complaint  Patient presents with  . Altered Mental Status    (Consider location/radiation/quality/duration/timing/severity/associated sxs/prior treatment) The history is provided by the EMS personnel and a relative. The history is limited by the condition of the patient.   79 year old male brought in by EMS at family's request. Family noted that he was very confused and very sweaty at home. Also patient has not been eating or drinking well for the past one to 2 days. Patient is followed by outpatient clinics. Family was told that a few days ago that there was some concern for a small stroke. Patient was not running a fever at home and less the sweating that occurred tonight was related to one. Patient has had and not had nausea or vomiting was not complaining of chest pain or belly pain. Patient does take sleeping medications at night the patient's son-in-law feels that his drowsiness currently is probably related to that because he started taking his medication prior to being brought in.  Past Medical History  Diagnosis Date  . PE (pulmonary embolism)     unprovoked PE completed 6 months of warfarin, warfarin d/ced 02/12/2010, repeat PE 07/10/2010 after a long car ride and now on coumadin.  [name] Hypertension   . CVA (cerebral vascular accident)     MRI 12/10 negative except small vessel changes, CT 12/2009 old R thalamic infarct  . Pituitary microadenoma     incidental finding CT 12/2009  . Cancer   . Prostate cancer   . PE (pulmonary embolism)   . Depression     Past Surgical History  Procedure Laterality Date  . Prostatectomy      Family History  Problem Relation Age of Onset  . Hypertension Father     Passed away from cerebral hemorrhage at age of 43.  [name] Dementia Mother     Passed away  . Hypertension Child     4 adult children    History   Substance Use Topics  . Smoking status: Never Smoker   . Smokeless tobacco: Not on file  . Alcohol Use: No      Review of Systems  Unable to perform ROS Psychiatric/Behavioral: Positive for confusion.  level V caveat applies due to the patient being very drowsy and not very communicative.  Allergies  Other  Home Medications   Current Outpatient Rx  Name  Route  Sig  Dispense  Refill  . ALPRAZolam (XANAX) 1 MG tablet   Oral   Take 1 mg by mouth 2 (two) times daily as needed for sleep. For anxiety         . amLODipine (NORVASC) 10 MG tablet   Oral   Take 10 mg by mouth daily.         [name] atorvastatin (LIPITOR) 40 MG tablet   Oral   Take 40 mg by mouth at bedtime.         . Cholecalciferol (VITAMIN D3) 2000 UNITS TABS   Oral   Take by mouth.         [name] FLUoxetine (PROZAC) 10 MG capsule   Oral   Take 1 capsule (10 mg total) by mouth daily.   30 capsule   0   . hydrochlorothiazide (MICROZIDE) 12.5 MG capsule   Oral   Take 12.5 mg by mouth daily.         [name]  ibuprofen (ADVIL,MOTRIN) 200 MG tablet   Oral   Take 800 mg by mouth 2 (two) times daily as needed for pain. For pain         . lisinopril (PRINIVIL,ZESTRIL) 40 MG tablet   Oral   Take 40 mg by mouth daily.         . Melatonin 5 MG TABS   Oral   Take 1 tablet (5 mg total) by mouth at bedtime.   30 tablet   0   . Multiple Vitamins-Minerals (MULTIVITAMIN WITH MINERALS) tablet   Oral   Take 1 tablet by mouth daily.         [name] omeprazole (PRILOSEC) 20 MG capsule   Oral   Take 1 capsule (20 mg total) by mouth daily.   30 capsule   3   . pyridOXINE (VITAMIN B-6) 100 MG tablet   Oral   Take 100 mg by mouth daily.         . ramelteon (ROZEREM) 8 MG tablet   Oral   Take 8 mg by mouth at bedtime.         [name] warfarin (COUMADIN) 5 MG tablet   Oral   Take 5-7.5 mg by mouth See admin instructions. Take as directed by anticoagulation clinic provider. Currently taking 1 & 1/2 tablets  Monday,Wednesday, Friday. 1 tablet all other days.           BP 106/70  Pulse 51  Temp(Src) 97.7 F (36.5 C)  Resp 15  SpO2 100%  Physical Exam  Nursing note and vitals reviewed. Constitutional: He appears well-developed and well-nourished. No distress.  HENT:  Head: Normocephalic and atraumatic.  Mucous membranes are slightly dry.  Eyes: Conjunctivae are normal. Pupils are equal, round, and reactive to light.  Neck: Normal range of motion. Neck supple.  Cardiovascular: Normal rate, regular rhythm, normal heart sounds and intact distal pulses.   No murmur heard. Pulmonary/Chest: Effort normal and breath sounds normal. No respiratory distress. He has no wheezes. He has no rales.  Abdominal: Soft. Bowel sounds are normal. There is no tenderness.  Musculoskeletal: Normal range of motion. He exhibits no edema.  Neurological:  Very drowsy. Difficult to follow commands.  Skin: Skin is warm. No erythema.    ED Course  Procedures (including critical care time)  Labs Reviewed  CBC - Abnormal; Notable for the following:    WBC 3.7 (*)    RBC 3.77 (*)    Hemoglobin 11.3 (*)    HCT 33.8 (*)    RDW 16.0 (*)    All other components within normal limits  COMPREHENSIVE METABOLIC PANEL - Abnormal; Notable for the following:    Glucose, Bld 107 (*)    Creatinine, Ser 1.56 (*)    AST 46 (*)    GFR calc non Af Amer 41 (*)    GFR calc Af Amer 47 (*)    All other components within normal limits  PROTIME-INR - Abnormal; Notable for the following:    Prothrombin Time 21.5 (*)    INR 1.95 (*)    All other components within normal limits  TROPONIN I  URINALYSIS, ROUTINE W REFLEX MICROSCOPIC  GLUCOSE, CAPILLARY   Dg Chest 2 View  01/28/2013  *RADIOLOGY REPORT*  Clinical Data: Altered mental status.  CHEST - 2 VIEW  Comparison: CT chest 09/11/2012.  PA and lateral chest 10/23/2012 and plain film chest 09/11/2012.  Findings: Lung volumes are lower than on the comparison studies. There is  some airspace  disease in the bases, worse on the left.  No pneumothorax or pleural fluid.  Heart size normal.  IMPRESSION: Left greater than right basilar airspace opacities are likely due to atelectasis rather than pneumonia in this low-volume chest.   Original Report Authenticated By: Holley Dexter, M.D.    Ct Head Wo Contrast  01/28/2013  *RADIOLOGY REPORT*  Clinical Data: Lethargy.  CT HEAD WITHOUT CONTRAST  Technique:  Contiguous axial images were obtained from the base of the skull through the vertex without contrast.  Comparison: Head CT scan brain MRI 04/09/2012.  Findings: Atrophy and extensive chronic microvascular ischemic change are again seen.  No evidence of acute abnormality including infarction, hemorrhage, mass lesion, mass effect, midline shift or abnormal extra-axial fluid collection is identified.  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  The calvarium is intact.  No pneumocephalus or hydrocephalus.  IMPRESSION: No acute finding.  Extensive chronic microvascular ischemic change.   Original Report Authenticated By: Holley Dexter, M.D.    Results for orders placed during the hospital encounter of 01/27/13  CBC      Result Value Range   WBC 3.7 (*) 4.0 - 10.5 K/uL   RBC 3.77 (*) 4.22 - 5.81 MIL/uL   Hemoglobin 11.3 (*) 13.0 - 17.0 g/dL   HCT 16.1 (*) 09.6 - 04.5 %   MCV 89.7  78.0 - 100.0 fL   MCH 30.0  26.0 - 34.0 pg   MCHC 33.4  30.0 - 36.0 g/dL   RDW 40.9 (*) 81.1 - 91.4 %   Platelets 152  150 - 400 K/uL  COMPREHENSIVE METABOLIC PANEL      Result Value Range   Sodium 141  135 - 145 mEq/L   Potassium 3.6  3.5 - 5.1 mEq/L   Chloride 105  96 - 112 mEq/L   CO2 28  19 - 32 mEq/L   Glucose, Bld 107 (*) 70 - 99 mg/dL   BUN 18  6 - 23 mg/dL   Creatinine, Ser 7.82 (*) 0.50 - 1.35 mg/dL   Calcium 8.9  8.4 - 95.6 mg/dL   Total Protein 6.8  6.0 - 8.3 g/dL   Albumin 3.7  3.5 - 5.2 g/dL   AST 46 (*) 0 - 37 U/L   ALT 30  0 - 53 U/L   Alkaline Phosphatase 54  39  - 117 U/L   Total Bilirubin 0.5  0.3 - 1.2 mg/dL   GFR calc non Af Amer 41 (*) >90 mL/min   GFR calc Af Amer 47 (*) >90 mL/min  PROTIME-INR      Result Value Range   Prothrombin Time 21.5 (*) 11.6 - 15.2 seconds   INR 1.95 (*) 0.00 - 1.49  TROPONIN I      Result Value Range   Troponin I <0.30  <0.30 ng/mL  URINALYSIS, ROUTINE W REFLEX MICROSCOPIC      Result Value Range   Color, Urine YELLOW  YELLOW   APPearance CLEAR  CLEAR   Specific Gravity, Urine 1.013  1.005 - 1.030   pH 6.0  5.0 - 8.0   Glucose, UA NEGATIVE  NEGATIVE mg/dL   Hgb urine dipstick NEGATIVE  NEGATIVE   Bilirubin Urine NEGATIVE  NEGATIVE   Ketones, ur NEGATIVE  NEGATIVE mg/dL   Protein, ur NEGATIVE  NEGATIVE mg/dL   Urobilinogen, UA 0.2  0.0 - 1.0 mg/dL   Nitrite NEGATIVE  NEGATIVE   Leukocytes, UA NEGATIVE  NEGATIVE  GLUCOSE, CAPILLARY  Result Value Range   Glucose-Capillary 97  70 - 99 mg/dL      Date: 16/06/9603  Rate: 46  Rhythm: sinus bradycardia  QRS Axis: normal  Intervals: normal  ST/T Wave abnormalities: nonspecific T wave changes  Conduction Disutrbances:left anterior fascicular block  Narrative Interpretation:   Old EKG Reviewed: unchanged Also with right bundle branch block. And first degree AV block. No significant change in EKG compared to 04/09/2012   1. Altered mental status       MDM  Patient brought in by EMS at family's request. They noticed that he was very sweaty patient also was confused however he does take sleep aids at night in the aortic taken and the patient's son-in-law called to his lethargy was most likely related to that. Lab workup here without any significant findings other than a bump in his creatinine to 1.5 he is followed by outpatient clinics and that can be repeated again in a week. Patient hydrated overnight family felt that he was not needing or drinking well. Patient is now alert suspect that the altered mental status was mostly related to his nighttime  sleep medications he is now eating breakfast. Has no specific complaints. There is a degree of dementia. But it is mild. Family is okay with him going home. Chest x-ray was negative for pneumonia pulmonary edema pneumothorax. There was no leukocytosis no significant Abnormalities other than the creatinine and his BUN was normal. His INR is slightly subtherapeutic at 1.95 troponin was negative EKG was negative for any acute cardiac events. Head CT was repeated and showed no acute events there was suspicion that perhaps 2 weeks ago he had immediate mini stroke however no evidence of that on CT today.        Shelda Jakes, MD 01/28/13 [phone]

## 2013-01-28 NOTE — ED Notes (Signed)
Report received, assumed care.  

## 2013-01-29 ENCOUNTER — Ambulatory Visit (INDEPENDENT_AMBULATORY_CARE_PROVIDER_SITE_OTHER): Payer: Medicare Other | Admitting: Pharmacist

## 2013-01-29 DIAGNOSIS — I2699 Other pulmonary embolism without acute cor pulmonale: Secondary | ICD-10-CM

## 2013-01-29 DIAGNOSIS — Z5181 Encounter for therapeutic drug level monitoring: Secondary | ICD-10-CM

## 2013-01-29 DIAGNOSIS — Z7901 Long term (current) use of anticoagulants: Secondary | ICD-10-CM

## 2013-01-29 DIAGNOSIS — Z7902 Long term (current) use of antithrombotics/antiplatelets: Secondary | ICD-10-CM

## 2013-01-29 DIAGNOSIS — Z86711 Personal history of pulmonary embolism: Secondary | ICD-10-CM

## 2013-01-29 NOTE — Patient Instructions (Signed)
Patient instructed to take medications as defined in the Anti-coagulation Track section of this encounter.  Patient instructed to take today's dose.  Patient verbalized understanding of these instructions.    

## 2013-01-29 NOTE — Progress Notes (Signed)
Anti-Coagulation Progress Note  [name] [name] is a 79 y.o. male who is currently on an anti-coagulation regimen.    RECENT RESULTS: Recent results are below, the most recent result is correlated with a dose of 42.5 mg. per week: Lab Results  Component Value Date   INR 2.30 01/29/2013   INR 1.95* 01/28/2013   INR 2.1 01/19/2013    ANTI-COAG DOSE: Anticoagulation Dose Instructions as of 01/29/2013     Glynis Smiles Tue Wed Thu Fri Sat   New Dose 5 mg 7.5 mg 5 mg 7.5 mg 5 mg 7.5 mg 5 mg       ANTICOAG SUMMARY: Anticoagulation Episode Summary   Current INR goal 2.0-3.0  Next INR check 02/26/2013  INR from last check 2.30 (01/29/2013)  Weekly max dose   Target end date Indefinite  [facility]  Preferred lab   Send INR reminders to ANTICOAG IMP   Indications  Pulmonary embolism [415.19] Encounter for long-term use of antiplatelets/antithrombotics [V58.83 V58.63]        Comments History of multiple venous embolic episodes. Will continue to annual re-evaluate continued need for warfarin weighing risks vs. benefits.       Anticoagulation Care Providers   Provider Role Specialty Phone number   Farley Ly, MD  Internal Medicine [phone]      ANTICOAG TODAY: Anticoagulation Summary as of 01/29/2013   INR goal 2.0-3.0  Selected INR 2.30 (01/29/2013)  Next INR check 02/26/2013  Target end date Indefinite   Indications  Pulmonary embolism [415.19] Encounter for long-term use of antiplatelets/antithrombotics [V58.83 V58.63]      Anticoagulation Episode Summary   [facility] check [facility]   Preferred lab    Send INR reminders to ANTICOAG IMP   Comments History of multiple venous embolic episodes. Will continue to annual re-evaluate continued need for warfarin weighing risks vs. benefits.     Anticoagulation Care Providers   Provider Role Specialty Phone number   Farley Ly, MD  Internal Medicine [phone]      PATIENT  INSTRUCTIONS: Patient Instructions  Patient instructed to take medications as defined in the Anti-coagulation Track section of this encounter.  Patient instructed to take today's dose.  Patient verbalized understanding of these instructions.       FOLLOW-UP Return in 4 weeks (on 02/26/2013) for Follow up INR at 3:30PM.  [name], [name] Pharm.D., CACP

## 2013-02-05 ENCOUNTER — Encounter: Payer: Self-pay | Admitting: Neurology

## 2013-02-05 ENCOUNTER — Ambulatory Visit (INDEPENDENT_AMBULATORY_CARE_PROVIDER_SITE_OTHER): Payer: Medicare Other | Admitting: Neurology

## 2013-02-05 VITALS — BP 124/76 | HR 64 | Temp 98.6°F | Ht 66.0 in | Wt 153.0 lb

## 2013-02-05 DIAGNOSIS — F0280 Dementia in other diseases classified elsewhere without behavioral disturbance: Secondary | ICD-10-CM

## 2013-02-05 DIAGNOSIS — I6789 Other cerebrovascular disease: Secondary | ICD-10-CM | POA: Insufficient documentation

## 2013-02-05 DIAGNOSIS — G309 Alzheimer's disease, unspecified: Secondary | ICD-10-CM

## 2013-02-05 MED ORDER — DONEPEZIL HCL 5 MG PO TABS: 5.0000 mg | ORAL_TABLET | Freq: Every day | ORAL | Status: DC

## 2013-02-05 NOTE — Progress Notes (Signed)
[facility] [address] [place]. [place] [address] [phone]       OFFICE FOLLOW-UP NOTE  [name] Date of Birth:  10/31/1930 Medical Record Number:  [id number]   HPI: 83 male with remote right hemispheric TIA in May 2011 and mild cognitive impairment He returns today for followup after his last visit with me on 01/05/2012. Continues to do well from a neurovascular standpoint and has not had a any further stroke or TIAs. He remains on warfarin and is tolerating it well without significant bleeding, bruising or the side effects. He states his blood pressure is under good control and it is 124/76 in office today. He however is had increasing confusion and memory difficulties for the last 1 year which have been progressive. He now needs help with taking his medications. He has stopped going out. He in fact wandered out a few weeks ago from his home and needed help to get back. He denies any agitation or caval changes. He has no significant hallucination or other lesions. His gait and balance are fine and there have been no safety issues. He has now moved and to live with his daughter for the last 2-3 months. The patient did undergo labs for treatable causes of cognitive impairment at last visit and vitamin B12, TSH and RPR were normal on 01/05/2012. His mother did have Alzheimer's. ROS:   14 system review of systems is positive for fever, chills, fatigue, joint pain, aching muscle, memory loss, confusion, decreased energy.  PMH:  Past Medical History  Diagnosis Date  . PE (pulmonary embolism)     unprovoked PE completed 6 months of warfarin, warfarin d/ced 02/12/2010, repeat PE 07/10/2010 after a long car ride and now on coumadin.  [name] Hypertension   . CVA (cerebral vascular accident)     MRI 12/10 negative except small vessel changes, CT 12/2009 old R thalamic infarct  . Pituitary microadenoma     incidental finding CT 12/2009  . Cancer   . Prostate cancer   . PE  (pulmonary embolism)   . Depression   . Heart attack   . High cholesterol   . Heart disease     Social History:  History   Social History  . Marital Status: Married    Spouse Name: N/A    Number of Children: 4  . Years of Education: master's   Occupational History  .      retired   Social History Main Topics  . Smoking status: Never Smoker   . Smokeless tobacco: Not on file  . Alcohol Use: No  . Drug Use: No  . Sexually Active: No   Other Topics Concern  . Not on file   Social History Narrative   Lives with his wife who has dementia at their home.  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  Still driving    Medications:   Current Outpatient Prescriptions on File Prior to Visit  Medication Sig Dispense Refill  . amLODipine (NORVASC) 10 MG tablet Take 10 mg by mouth daily.      [name] atorvastatin (LIPITOR) 40 MG tablet Take 40 mg by mouth at bedtime.      . Cholecalciferol (VITAMIN D3) 2000 UNITS TABS Take by mouth.      . hydrochlorothiazide (MICROZIDE) 12.5 MG capsule Take 12.5 mg by mouth daily.      [name] ibuprofen (ADVIL,MOTRIN) 200 MG tablet Take 800 mg by mouth 2 (two) times daily as needed for pain. For pain      .  lisinopril (PRINIVIL,ZESTRIL) 40 MG tablet Take 40 mg by mouth daily.      . Melatonin 5 MG TABS Take 1 tablet (5 mg total) by mouth at bedtime.  30 tablet  0  . Multiple Vitamins-Minerals (MULTIVITAMIN WITH MINERALS) tablet Take 1 tablet by mouth daily.      [name] omeprazole (PRILOSEC) 20 MG capsule Take 1 capsule (20 mg total) by mouth daily.  30 capsule  3  . pyridOXINE (VITAMIN B-6) 100 MG tablet Take 100 mg by mouth daily.      . ramelteon (ROZEREM) 8 MG tablet Take 8 mg by mouth at bedtime.      [name] warfarin (COUMADIN) 5 MG tablet Take 5-7.5 mg by mouth See admin instructions. Take as directed by anticoagulation clinic provider. Currently taking 1 & 1/2 tablets Monday,Wednesday, Friday. 1 tablet all other days.       No current facility-administered medications on file prior to  visit.    Allergies:   Allergies  Allergen Reactions  . Other     Shrimp gives him gout    Physical Exam General: well developed, well nourished, seated, in no evident distress Head: head normocephalic and atraumatic. Orohparynx benign Neck: supple with no carotid or supraclavicular bruits Cardiovascular: regular rate and rhythm, no murmurs Musculoskeletal: no deformity Skin:  no rash/petichiae Vascular:  Normal pulses all extremities Filed Vitals:   02/05/13 1453  BP: 124/76  Pulse: 64  Temp: 98.6 F (37 C)    Neurologic Exam Mental Status: Awake and fully alert. Mini-Mental status exam score 23/30. Deficits in orientation recall and attention. [employer] 1/4. N. naming test only 10. Genetic depression scale 2 not depressed. Oriented to place and time. Recent and remote memory intact. Attention span, concentration and fund of knowledge appropriate. Mood and affect appropriate.  Cranial Nerves: Fundoscopic exam reveals sharp disc margins. Pupils equal, briskly reactive to light. Extraocular movements full without nystagmus. Visual fields full to confrontation. Hearing intact. Facial sensation intact. Face, tongue, palate moves normally and symmetrically.  Motor: Normal bulk and tone. Normal strength in all tested extremity muscles. Sensory.: intact to tough and pinprick and vibratory.  Coordination: Rapid alternating movements normal in all extremities. Finger-to-nose and heel-to-shin performed accurately bilaterally. Gait and Station: Arises from chair without difficulty. Stance is normal. Gait demonstrates normal stride length and balance . Able to heel, toe and tandem walk without difficulty.  Reflexes: 1+ and symmetric. Toes downgoing.     ASSESSMENT:  38 patient with mild cognitive impairment which appears not to progressed to mild dementia. Likely Alzheimer`s type Remote history of right brain TIA in May 2011 and stable from a neurovascular standpoint.   PLAN:  I  had a long discussion with the patient and family regarding his progressive chondral decline and discussed treatment options including Aricept. Start Aricept 5 mg daily sta continue warfarin for stroke prevention admitted strict control of hypertension with blood pressure goal below 130/90. Start fish oil 2 capsules daily. Increase participation and cognitively challenging activities. return for followup in 2 months with Enid Skeens, nurse practitioner

## 2013-02-05 NOTE — Patient Instructions (Signed)
He was advised to start Aricept 5 mg daily for one month and then increase if tolerated without side effects to 10 mg daily. Start fish oil 2 capsules daily as well. Continue warfarin for stroke prevention and maintain strict control of hypertension with blood pressure goal below 130/90. Return for followup in 2 months with Larita Fife, NP

## 2013-02-13 ENCOUNTER — Telehealth: Payer: Self-pay | Admitting: Internal Medicine

## 2013-02-13 NOTE — Telephone Encounter (Signed)
I was following up on patient's episodes of confusion and forgetfulness as well as depression. When talking to the patient he informed me that he is not doing well and noted chest pain. He could not give me more details about his pain. Denied any shortness of breath. I informed him to call 911 but he states that he wanted to have the number of the clinic. He also stated that he will contact his daughter who is at work right now.  I then was able to talk to the daughter received that she is currently with her father. Her father did not complain anything to her. She noted that she would check him out and will obtain his blood pressure and will call back for an appointment possibly. The daughter is still concerned about patient's confusion. The patient was evaluated by neurology was started patient on Aricept on 02/05/13

## 2013-02-20 ENCOUNTER — Encounter: Payer: Self-pay | Admitting: Internal Medicine

## 2013-02-20 ENCOUNTER — Observation Stay (HOSPITAL_COMMUNITY)
Admission: AD | Admit: 2013-02-20 | Discharge: 2013-02-22 | Disposition: A | Discharge status: Skilled Nursing Facility | Payer: Medicare Other | Source: Ambulatory Visit | Attending: Internal Medicine | Admitting: Internal Medicine

## 2013-02-20 ENCOUNTER — Encounter (HOSPITAL_COMMUNITY): Payer: Self-pay | Admitting: *Deleted

## 2013-02-20 ENCOUNTER — Ambulatory Visit (HOSPITAL_COMMUNITY)
Admission: AD | Admit: 2013-02-20 | Discharge: 2013-02-20 | Disposition: A | Discharge status: Home / Self Care | Payer: Medicare Other | Source: Ambulatory Visit | Admitting: Internal Medicine

## 2013-02-20 ENCOUNTER — Ambulatory Visit: Payer: Medicare Other | Admitting: Internal Medicine

## 2013-02-20 VITALS — BP 130/70 | HR 60 | Temp 97.2°F | Ht 66.0 in | Wt 163.0 lb

## 2013-02-20 DIAGNOSIS — Z7901 Long term (current) use of anticoagulants: Secondary | ICD-10-CM | POA: Insufficient documentation

## 2013-02-20 DIAGNOSIS — E870 Hyperosmolality and hypernatremia: Secondary | ICD-10-CM | POA: Insufficient documentation

## 2013-02-20 DIAGNOSIS — R001 Bradycardia, unspecified: Secondary | ICD-10-CM

## 2013-02-20 DIAGNOSIS — Z8546 Personal history of malignant neoplasm of prostate: Secondary | ICD-10-CM | POA: Insufficient documentation

## 2013-02-20 DIAGNOSIS — Z86711 Personal history of pulmonary embolism: Secondary | ICD-10-CM | POA: Diagnosis present

## 2013-02-20 DIAGNOSIS — G4700 Insomnia, unspecified: Secondary | ICD-10-CM | POA: Diagnosis present

## 2013-02-20 DIAGNOSIS — F3289 Other specified depressive episodes: Secondary | ICD-10-CM | POA: Insufficient documentation

## 2013-02-20 DIAGNOSIS — F0280 Dementia in other diseases classified elsewhere without behavioral disturbance: Secondary | ICD-10-CM | POA: Insufficient documentation

## 2013-02-20 DIAGNOSIS — R5381 Other malaise: Secondary | ICD-10-CM | POA: Insufficient documentation

## 2013-02-20 DIAGNOSIS — I498 Other specified cardiac arrhythmias: Secondary | ICD-10-CM | POA: Insufficient documentation

## 2013-02-20 DIAGNOSIS — M7989 Other specified soft tissue disorders: Secondary | ICD-10-CM | POA: Insufficient documentation

## 2013-02-20 DIAGNOSIS — I10 Essential (primary) hypertension: Secondary | ICD-10-CM | POA: Diagnosis present

## 2013-02-20 DIAGNOSIS — R4182 Altered mental status, unspecified: Secondary | ICD-10-CM

## 2013-02-20 DIAGNOSIS — Z79899 Other long term (current) drug therapy: Secondary | ICD-10-CM | POA: Insufficient documentation

## 2013-02-20 DIAGNOSIS — R32 Unspecified urinary incontinence: Secondary | ICD-10-CM | POA: Insufficient documentation

## 2013-02-20 DIAGNOSIS — R269 Unspecified abnormalities of gait and mobility: Secondary | ICD-10-CM | POA: Insufficient documentation

## 2013-02-20 DIAGNOSIS — R0602 Shortness of breath: Secondary | ICD-10-CM | POA: Insufficient documentation

## 2013-02-20 DIAGNOSIS — F32A Depression, unspecified: Secondary | ICD-10-CM | POA: Diagnosis present

## 2013-02-20 DIAGNOSIS — F329 Major depressive disorder, single episode, unspecified: Secondary | ICD-10-CM | POA: Diagnosis present

## 2013-02-20 DIAGNOSIS — R531 Weakness: Secondary | ICD-10-CM | POA: Diagnosis present

## 2013-02-20 DIAGNOSIS — G309 Alzheimer's disease, unspecified: Secondary | ICD-10-CM | POA: Insufficient documentation

## 2013-02-20 LAB — CBC WITH DIFFERENTIAL/PLATELET
Basophils Absolute: 0 10*3/uL (ref 0.0–0.1)
Basophils Relative: 1 % (ref 0–1)
Eosinophils Relative: 7 % — ABNORMAL HIGH (ref 0–5)
HCT: 34.5 % — ABNORMAL LOW (ref 39.0–52.0)
MCHC: 33 g/dL (ref 30.0–36.0)
MCV: 90.3 fL (ref 78.0–100.0)
Monocytes Absolute: 0.2 10*3/uL (ref 0.1–1.0)
Neutro Abs: 1.3 10*3/uL — ABNORMAL LOW (ref 1.7–7.7)
Platelets: 141 10*3/uL — ABNORMAL LOW (ref 150–400)
RDW: 16.3 % — ABNORMAL HIGH (ref 11.5–15.5)

## 2013-02-20 LAB — COMPREHENSIVE METABOLIC PANEL
Albumin: 3.2 g/dL — ABNORMAL LOW (ref 3.5–5.2)
BUN: 14 mg/dL (ref 6–23)
Calcium: 9 mg/dL (ref 8.4–10.5)
Chloride: 110 mEq/L (ref 96–112)
Creatinine, Ser: 1.06 mg/dL (ref 0.50–1.35)
Total Bilirubin: 0.3 mg/dL (ref 0.3–1.2)

## 2013-02-20 LAB — APTT: aPTT: 38 seconds — ABNORMAL HIGH (ref 24–37)

## 2013-02-20 LAB — PROTIME-INR: Prothrombin Time: 26 seconds — ABNORMAL HIGH (ref 11.6–15.2)

## 2013-02-20 LAB — MAGNESIUM: Magnesium: 1.6 mg/dL (ref 1.5–2.5)

## 2013-02-20 MED ORDER — SODIUM CHLORIDE 0.9 % IV SOLN
INTRAVENOUS | Status: DC
  Administered 2013-02-20: 1000 mL via INTRAVENOUS

## 2013-02-20 MED ORDER — ACETAMINOPHEN 650 MG RE SUPP: 650.0000 mg | Freq: Four times a day (QID) | RECTAL | Status: DC | PRN

## 2013-02-20 MED ORDER — DOCUSATE SODIUM 100 MG PO CAPS
100.0000 mg | ORAL_CAPSULE | Freq: Two times a day (BID) | ORAL | Status: DC
  Administered 2013-02-21 – 2013-02-22 (×3): 100 mg via ORAL
  Filled 2013-02-20 (×5): qty 1

## 2013-02-20 MED ORDER — ONDANSETRON HCL 4 MG PO TABS: 4.0000 mg | ORAL_TABLET | Freq: Four times a day (QID) | ORAL | Status: DC | PRN

## 2013-02-20 MED ORDER — ONDANSETRON HCL 4 MG/2ML IJ SOLN: 4.0000 mg | Freq: Four times a day (QID) | INTRAMUSCULAR | Status: DC | PRN

## 2013-02-20 MED ORDER — SODIUM CHLORIDE 0.9 % IJ SOLN
3.0000 mL | Freq: Two times a day (BID) | INTRAMUSCULAR | Status: DC
  Administered 2013-02-21: 3 mL via INTRAVENOUS

## 2013-02-20 MED ORDER — ACETAMINOPHEN 325 MG PO TABS: 650.0000 mg | ORAL_TABLET | Freq: Four times a day (QID) | ORAL | Status: DC | PRN

## 2013-02-20 NOTE — H&P (Signed)
Date: 02/20/2013               Patient Name:  [name] [name] MRN: [id number]  DOB: [birth year] Age / Sex: 79 y.o., male   PCP: Belia Heman, MD              Medical Service: Internal Medicine Teaching Service              Attending Physician: Dr. Lars Mage, MD    First Contact: Dr. Burtis Junes Pager: [phone]  Second Contact: Dr. Clyde Lundborg Pager: [phone]            After Hours (After 5p/  First Contact Pager: [phone]  weekends / holidays): Second Contact Pager: [phone]   Chief Complaint: weakness  History of Present Illness: [name] is a 79 y.o. male with history of PE on chronic anticoagulation, HTN, remote CVA, prostate cancer, nonobstructive CAD, HLD, and Alzheimer's dementia who originally presented to clinic for altered mental status, anorexia, and weakness. Given significant dementia most of hx were obtained from chart review and information gathered at clinic encounter. The daughter reports that over the past month, she has noticed worsening disorientation in her father that seems more accelerated than his chronic dementia. This disorientation has worsened over the past weekend, when she noted that he seemed more confused than usual and had an unsteady gait. She says he has not fallen, but sometimes he has difficulty picking up his feet and walking on his own. She's concerned that he will fall. She also reports that he has not been eating or drinking over the past few days. He continues to have urinary incontinence and she is not sure how often he is using the bathroom or if he is having urinary retention. She says that he complains of pain all over his body. She thinks he may be more short of breath. She says his feet appear more swollen than usual.  She took him to the emergency room on May 10th for confusion. At that time, his confusion was attributed to Xanax which he was given at night to help him sleep. Since that time, he has not been taking Xanax and is still confused.  At  baseline, patient has had a gradual cognitive decline. He's been evaluated by neurology and recently saw Dr. Pearlean Brownie. He was diagnosed with Alzheimer's dementia and started on donepezil. His daughter reports that prior to this most recent deterioration, he was walking on his without any assistive devices.  Meds: Medications Prior to Admission  Medication Sig Dispense Refill  . amLODipine (NORVASC) 10 MG tablet Take 10 mg by mouth daily.      [name] atorvastatin (LIPITOR) 40 MG tablet Take 40 mg by mouth at bedtime.      . Cholecalciferol (VITAMIN D3) 2000 UNITS TABS Take by mouth.      . donepezil (ARICEPT) 5 MG tablet Take 1 tablet (5 mg total) by mouth at bedtime. Take 1 tablet at night daily x 4 weeks then two tablets daily  180 tablet  2  . hydrochlorothiazide (MICROZIDE) 12.5 MG capsule Take 12.5 mg by mouth daily.      [name] ibuprofen (ADVIL,MOTRIN) 200 MG tablet Take 800 mg by mouth 2 (two) times daily as needed for pain. For pain      . lisinopril (PRINIVIL,ZESTRIL) 40 MG tablet Take 40 mg by mouth daily.      . Melatonin 5 MG TABS Take 1 tablet (5 mg total) by mouth at bedtime.  30 tablet  0  . Multiple Vitamins-Minerals (MULTIVITAMIN WITH MINERALS) tablet Take 1 tablet by mouth daily.      [name] omeprazole (PRILOSEC) 20 MG capsule Take 1 capsule (20 mg total) by mouth daily.  30 capsule  3  . pyridOXINE (VITAMIN B-6) 100 MG tablet Take 100 mg by mouth daily.      . ramelteon (ROZEREM) 8 MG tablet Take 8 mg by mouth at bedtime.      [name] warfarin (COUMADIN) 5 MG tablet Take 5-7.5 mg by mouth See admin instructions. Take as directed by anticoagulation clinic provider. Currently taking 1 & 1/2 tablets Monday,Wednesday, Friday. 1 tablet all other days.        Allergies: Allergies as of 02/20/2013 - Review Complete 02/20/2013  Allergen Reaction Noted  . Other Other (See Comments) 08/17/2011   Past Medical History  Diagnosis Date  . PE (pulmonary embolism)     unprovoked PE completed 6 months of  warfarin, warfarin d/ced 02/12/2010, repeat PE 07/10/2010 after a long car ride and now on coumadin.  [name] Hypertension   . Pituitary microadenoma     incidental finding CT 12/2009  . Cancer   . Prostate cancer   . PE (pulmonary embolism)   . Depression   . Heart attack   . High cholesterol   . Coronary artery disease, non-occlusive     Cath 2008 w multivessel nonobstructive CAD   Past Surgical History  Procedure Laterality Date  . Prostatectomy     Family History  Problem Relation Age of Onset  . Hypertension Father     Passed away from cerebral hemorrhage at age of 77.  [name] Dementia Mother     Passed away  . Hypertension Child     4 adult children  . Cervical cancer Other   . Lung cancer Other   . Stroke Other    History   Social History  . Marital Status: Married    Spouse Name: N/A    Number of Children: 4  . Years of Education: master's   Occupational History  .      retired   Social History Main Topics  . Smoking status: Never Smoker   . Smokeless tobacco: Not on file  . Alcohol Use: No  . Drug Use: No  . Sexually Active: No   Other Topics Concern  . Not on file   Social History Narrative   Lives with daughter. Wife also has severe dementia.     Review of Systems: Review of systems not obtained due to patient factors.  Physical Exam: Blood pressure 112/71, pulse 49, temperature 97.5 F (36.4 C), temperature source Oral, resp. rate 20, height 5\' 6"  (1.676 m), weight 162 lb 1.6 oz (73.528 kg), SpO2 96.00%. Constitutional: Vital signs reviewed. Patient is an elderly man in no acute distress and cooperative with exam.  Head: Normocephalic and atraumatic  Eyes: PERRL, EOMI, conjunctivae normal, No scleral icterus.  Cardiovascular: Regularly irregular rhythm w HR 40-60, no murmurs. 2+ DP pulses bilaterally  Pulmonary/Chest: CTAB, no rales  Abdominal: Soft. Non-tender, non-distended  GU: no CVA tenderness Musculoskeletal: No joint deformities or erythema    Skin: Warm, dry and intact. No rash, cyanosis, or clubbing.  Psychiatric: Distant affect  Extremities: trace pedal edema bilaterally  Neurological:  Mental status: Oriented to self and place, unsure of day of week or month.  CN: subtle droop of L lower lip. Eyebrow furrow and eye closure symmetric. Sensation intact bilateral face.  PERRL, EOMI, hearing intact bilaterally,  palate elevation symmetric, shoulder shrug symmetric, tongue protrusion midline  Motor: 5/5 bilateral UE and LE  Reflexes: 2+ brachioradialis, 1+ patellar bilaterally  Cerebellar: normal FNF  Gait: slowed and unsteady. No foot drop appreciated.  Lab results:   Imaging results:  No results found.  Other results: EKG: RBBB, sinus bradycardia with frequent PVCs, left axis.  Assessment & Plan by Problem: #AMS: no known previous baseline except known to daughter whom is caregiver of pt and pts wife whom has advanced dementia as well. Pt is able to engage in conversation and state needs. Pt not febrile hard to assess symptoms as pt is not oriented to localize possible infectious etiology and no other systemic signs of infection. Given hx of prostate adenocarcinoma and recent change in mentation mets a possibility but last PSA 12/2012 0.17 making less likely. Pt is on anticoagulation given hx of PEs and given significant memory impairment fall could explain possible subdural. No focal neuro deficits to suggest acute vascular event. Pt is not hypoxic. NPH may also be a possibility given recent gait abnormalities. aricept can also cause anorexia, diarrhea, nausea/vomiting, weight loss, and urinary obstruction.  -CBC, bmet, UA, UCx, trops, TSH, POC DM -CXR -CT head -if febrile broad Abx and panculture  #Bradycardia: Pt has had documented bradycardia on previous admission in may but nothing previously. Pt was started on aricept that can cause conduction abnormalities and exacerbate underlying sick sinus syndromes. Last echo in  2011 EF 55%, no WMA, parasternal RV dilated and hypokinetic. Pt has had cath in 2008 showed nonobstructive CAD with diffuse plaque no stents placed.  -hold aricept  -tele -serial EKGs  #Pulmonary embolism: Pt is on chronic coumadin. Pt not hypoxic or tachycardic at this time.  -hold warfarin in setting of pending CT results to r/o hematoma -can restart if CT head negative  #Dementia: neurological workup by Dr. Pearlean Brownie and presumed [place] dementia started aricpet 02/05/13.   Dispo: Disposition is deferred at this time, awaiting improvement of current medical problems. Anticipated discharge in approximately 2-3 day(s).   The patient does have a current PCP (Neema Davina Poke, MD), therefore will be requiring OPC follow-up after discharge.   The patient does not have transportation limitations that hinder transportation to clinic appointments.  Signed: Christen Bame, MD 02/20/2013, 6:25 PM  Pgr: [phone]

## 2013-02-20 NOTE — Assessment & Plan Note (Addendum)
Patient with worsening disorientation on top of chronic cognitive impairment, anorexia, and weakness. Noted to be significantly bradycardic with a heart rate of 40 on presentation and EKG concerning for heart block. P waves do appear to be conducting, but has frequent PVCs as well. He has a history of nonobstructive coronary artery disease but no history of heart block or arrhythmia.  Was hypotensive on presentation, vitals were not orthostatic. Has multiple medical comorbidities thay may be contributing to altered mental status as well, including urinary incontinence with possible urinary retention. Has a history of urinary tract infections. Is on chronic anticoagulation with warfarin for unprovoked recurrent pulmonary embolism. There is no focal neurologic deficit to suggest acute hemorrhagic stroke. He does not remember any blood in his stool or in his urine. - admit patient for cardiac monitoring and workup of bradyarrhythmia - will need lab work including CBC, chemistry, urinalysis; rule out infectious etiologies - will need physical therapy and occupational therapy evaluation, patient's needs are becoming too great for daughter to care for on her at home - disposition discussed with patient's daughter who is in agreement  Case was discussed with Dr. Dalphine Handing who is in agreement with plan

## 2013-02-20 NOTE — Progress Notes (Signed)
Patient ID: [name] [name], male   DOB: 10/22/1930, 79 y.o.   MRN: [id number]  Subjective:   Patient ID: [name] [name] male   DOB: 1931-06-03 79 y.o.   MRN: [id number]  HPI: [name] is a 79 y.o. male with history of PE on chronic anticoagulation, HTN, remote CVA, prostate cancer, nonobstructive CAD, HLD, and Alzheimer's dementia presenting to the clinic with chief complaint of altered mental status, anorexia, and weakness. Patient is accompanied by his daughter with whom he lives. Patient's daughter provides the history due to patient's cognitive impairment. The daughter reports that over the past month, she has noticed worsening disorientation in her father that seems more accelerated than his chronic dementia. This disorientation has worsened over the past weekend, when she noted that he seemed more confused than usual and had an unsteady gait. She says he has not fallen, but sometimes he has difficulty picking up his feet and walking on his own. She's concerned that he will fall. She also reports that he has not been eating or drinking over the past few days. He continues to have urinary incontinence and she is not sure how often he is using the bathroom or if he is having urinary retention. She says that he complains of pain all over his body. She thinks he may be more short of breath. She says his feet appear more swollen than usual. She took him to the emergency room on May 10th for confusion. At that time, his confusion was attributed to Xanax which he was given at night to help him sleep. Since that time, he has not been taking Xanax and is still confused. At baseline, patient has had a gradual cognitive decline. He's been evaluated by neurology and recently saw Dr. Pearlean Brownie. He was diagnosed with Alzheimer's dementia and started on donepezil. His daughter reports that prior to this most recent deterioration, he was walking on his without any assistive devices.   Past Medical History   Diagnosis Date  . PE (pulmonary embolism)     unprovoked PE completed 6 months of warfarin, warfarin d/ced 02/12/2010, repeat PE 07/10/2010 after a long car ride and now on coumadin.  [name] Hypertension   . CVA (cerebral vascular accident)     MRI 12/10 negative except small vessel changes, CT 12/2009 old R thalamic infarct  . Pituitary microadenoma     incidental finding CT 12/2009  . Cancer   . Prostate cancer   . PE (pulmonary embolism)   . Depression   . Heart attack   . High cholesterol   . Heart disease    Current Outpatient Prescriptions  Medication Sig Dispense Refill  . amLODipine (NORVASC) 10 MG tablet Take 10 mg by mouth daily.      [name] atorvastatin (LIPITOR) 40 MG tablet Take 40 mg by mouth at bedtime.      . Cholecalciferol (VITAMIN D3) 2000 UNITS TABS Take by mouth.      . donepezil (ARICEPT) 5 MG tablet Take 1 tablet (5 mg total) by mouth at bedtime. Take 1 tablet at night daily x 4 weeks then two tablets daily  180 tablet  2  . hydrochlorothiazide (MICROZIDE) 12.5 MG capsule Take 12.5 mg by mouth daily.      [name] ibuprofen (ADVIL,MOTRIN) 200 MG tablet Take 800 mg by mouth 2 (two) times daily as needed for pain. For pain      . lisinopril (PRINIVIL,ZESTRIL) 40 MG tablet Take 40 mg by mouth daily.      [name]  Melatonin 5 MG TABS Take 1 tablet (5 mg total) by mouth at bedtime.  30 tablet  0  . Multiple Vitamins-Minerals (MULTIVITAMIN WITH MINERALS) tablet Take 1 tablet by mouth daily.      [name] omeprazole (PRILOSEC) 20 MG capsule Take 1 capsule (20 mg total) by mouth daily.  30 capsule  3  . pyridOXINE (VITAMIN B-6) 100 MG tablet Take 100 mg by mouth daily.      . ramelteon (ROZEREM) 8 MG tablet Take 8 mg by mouth at bedtime.      [name] warfarin (COUMADIN) 5 MG tablet Take 5-7.5 mg by mouth See admin instructions. Take as directed by anticoagulation clinic provider. Currently taking 1 & 1/2 tablets Monday,Wednesday, Friday. 1 tablet all other days.       No current facility-administered  medications for this visit.   Family History  Problem Relation Age of Onset  . Hypertension Father     Passed away from cerebral hemorrhage at age of 60.  [name] Dementia Mother     Passed away  . Hypertension Child     4 adult children  . Cervical cancer Other   . Lung cancer Other   . Stroke Other    History   Social History  . Marital Status: Married    Spouse Name: N/A    Number of Children: 4  . Years of Education: master's   Occupational History  .      retired   Social History Main Topics  . Smoking status: Never Smoker   . Smokeless tobacco: None  . Alcohol Use: No  . Drug Use: No  . Sexually Active: No   Other Topics Concern  . None   Social History Narrative   Lives with his wife who has dementia at their home.  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  Still driving   Review of Systems: Unable to perform complete ROS due to patient's impaired mental status  Objective:  Physical Exam: Filed Vitals:   02/20/13 1523 02/20/13 1632 02/20/13 1635  BP: 96/61 108/70 130/70  Pulse: 40 59 60  Temp: 97.2 F (36.2 C)    TempSrc: Oral    Height: 5\' 6"  (1.676 m)    Weight: 163 lb (73.936 kg)    SpO2: 96%     Constitutional: Vital signs reviewed.  Patient is an elderly man in no acute distress and cooperative with exam.   Head: Normocephalic and atraumatic Eyes: PERRL, EOMI, conjunctivae normal, No scleral icterus.  Cardiovascular: Regularly irregular rhythm w HR 40-60, no murmurs. 2+ DP pulses bilaterally Pulmonary/Chest: CTAB, no rales Abdominal: Soft. Non-tender, non-distended  GU: no CVA tenderness Musculoskeletal: No joint deformities or erythema  Skin: Warm, dry and intact. No rash, cyanosis, or clubbing.  Psychiatric: Distant affect Extremities: trace pedal edema bilaterally Neurological: Mental status: Oriented to self and place, unsure of day of week or month.  CN: subtle droop of L lower lip. Eyebrow furrow and eye closure symmetric. Sensation intact bilateral face.   PERRL, EOMI, hearing intact bilaterally, palate elevation symmetric, shoulder shrug symmetric, tongue protrusion midline Motor: 5/5 bilateral UE and LE Reflexes: 2+ brachioradialis, 1+ patellar bilaterally Cerebellar: normal FNF Gait: slowed and unsteady. No foot drop appreciated.  ECG REPORT   Date: 02/20/2013  EKG Time: 4:50 PM  Rate: 60  Rhythm: Sinus bradycardia  Axis: leftward  Intervals: RBBB, prolonged QT  ST&T Change: nonspecific ST segment changes  Narrative Interpretation: Sinus bradycardia with frequent PVCs, concerning for second degree/advanced heart block  Assessment & Plan:   Please see problem-based charting for assessment and plan.

## 2013-02-21 ENCOUNTER — Encounter (HOSPITAL_COMMUNITY): Payer: Self-pay | Admitting: Radiology

## 2013-02-21 ENCOUNTER — Inpatient Hospital Stay (HOSPITAL_COMMUNITY): Payer: Medicare Other

## 2013-02-21 LAB — URINALYSIS, ROUTINE W REFLEX MICROSCOPIC
Glucose, UA: NEGATIVE mg/dL
Ketones, ur: NEGATIVE mg/dL
Leukocytes, UA: NEGATIVE
Nitrite: NEGATIVE
Protein, ur: NEGATIVE mg/dL
pH: 6 (ref 5.0–8.0)

## 2013-02-21 LAB — TROPONIN I: Troponin I: <0.3 ng/mL (ref <0.30)

## 2013-02-21 LAB — TSH: TSH: 0.739 u[IU]/mL (ref 0.350–4.500)

## 2013-02-21 LAB — PROTIME-INR
INR: 2.41 — ABNORMAL HIGH (ref 0.00–1.49)
Prothrombin Time: 25.1 seconds — ABNORMAL HIGH (ref 11.6–15.2)

## 2013-02-21 LAB — BASIC METABOLIC PANEL
Calcium: 8.6 mg/dL (ref 8.4–10.5)
Chloride: 110 mEq/L (ref 96–112)
Creatinine, Ser: 1.02 mg/dL (ref 0.50–1.35)
GFR calc Af Amer: 79 mL/min — ABNORMAL LOW (ref >90)
Sodium: 141 mEq/L (ref 135–145)

## 2013-02-21 MED ORDER — WARFARIN - PHARMACIST DOSING INPATIENT
Freq: Every day | Status: DC
  Administered 2013-02-21: 18:00:00

## 2013-02-21 MED ORDER — WARFARIN SODIUM 7.5 MG PO TABS
7.5000 mg | ORAL_TABLET | Freq: Once | ORAL | Status: AC
  Administered 2013-02-21: 7.5 mg via ORAL
  Filled 2013-02-21: qty 1

## 2013-02-21 MED ORDER — ENSURE COMPLETE PO LIQD
237.0000 mL | Freq: Two times a day (BID) | ORAL | Status: DC
  Administered 2013-02-22: 237 mL via ORAL

## 2013-02-21 NOTE — Progress Notes (Signed)
ANTICOAGULATION CONSULT NOTE - Follow Up Consult  Pharmacy Consult for Coumadin Indication: Hx PE  Patient Measurements: Height: 5\' 6"  (167.6 cm) Weight: 163 lb 11.2 oz (74.254 kg) IBW/kg (Calculated) : 63.8   Vital Signs: Temp: 98.1 F (36.7 C) (06/04 0559) Temp src: Axillary (06/04 0559) BP: 104/58 mmHg (06/04 0559) Pulse Rate: 53 (06/04 0559)   Recent Labs  02/20/13 1954 02/20/13 1955 02/21/13 0145 02/21/13 0750  HGB  --  11.4*  --   --   HCT  --  34.5*  --   --   PLT  --  141*  --   --   APTT  --  38*  --   --   LABPROT  --  26.0*  --  25.1*  INR  --  2.52*  --  2.41*  CREATININE  --  1.06  --  1.02  TROPONINI <0.30  --  <0.30 <0.30    Estimated Creatinine Clearance: 53 ml/min (by C-G formula based on Cr of 1.02).   Assessment: 79yo male admitted with AMS with history of PE continuing on chronic coumadin. Confirmed with teaching service, ok to continue with coumadin. INR this AM 2.41, home dose is 5mg  daily except 7.5mg  MWF. Hgb 11.4, Plts 141, no bleeding noted in chart.  Goal of Therapy:  INR 2-3 Monitor platelets by anticoagulation protocol: Yes    Plan:  1) Coumadin 7.5mg  x 1 tonight 2) F/u Daily INR  3) Monitor signs/symptoms of bleeding  Benjaman Pott, PharmD, BCPS 02/21/2013   3:41 PM

## 2013-02-21 NOTE — Progress Notes (Signed)
Subjective: Pt doing well this AM. No acute events overnight. Will attempt to call daughter for collateral information and goals of care to be defined. Still brady in 9s but asymptomatic.   Objective: Vital signs in last 24 hours: Filed Vitals:   02/20/13 1837 02/20/13 1838 02/20/13 2059 02/21/13 0559  BP: 122/87 136/86 103/64 104/58  Pulse: 55  54 53  Temp:   97.3 F (36.3 C) 98.1 F (36.7 C)  TempSrc:   Axillary Axillary  Resp:      Height:      Weight:    163 lb 11.2 oz (74.254 kg)  SpO2:   95% 95%   Weight change:   Intake/Output Summary (Last 24 hours) at 02/21/13 0817 Last data filed at 02/20/13 2200  Gross per 24 hour  Intake      0 ml  Output    300 ml  Net   -300 ml   General: resting in bed HEENT: PERRL, EOMI, no scleral icterus Cardiac: RRR, no rubs, murmurs or gallops Pulm: clear to auscultation bilaterally, moving normal volumes of air Abd: soft, nontender, nondistended, BS present Ext: warm and well perfused, no pedal edema Neuro: alert and oriented X3, cranial nerves II-XII grossly intact  Lab Results: Basic Metabolic Panel:  Recent Labs Lab 02/20/13 1955  NA 142  K 3.7  CL 110  CO2 23  GLUCOSE 110*  BUN 14  CREATININE 1.06  CALCIUM 9.0  MG 1.6   Liver Function Tests:  Recent Labs Lab 02/20/13 1955  AST 35  ALT 28  ALKPHOS 56  BILITOT 0.3  PROT 6.4  ALBUMIN 3.2*   CBC:  Recent Labs Lab 02/20/13 1955  WBC 3.0*  NEUTROABS 1.3*  HGB 11.4*  HCT 34.5*  MCV 90.3  PLT 141*   Cardiac Enzymes:  Recent Labs Lab 02/20/13 1954 02/21/13 0145  TROPONINI <0.30 <0.30   Thyroid Function Tests:  Recent Labs Lab 02/20/13 1955  TSH 0.739   Coagulation:  Recent Labs Lab 02/20/13 1955  LABPROT 26.0*  INR 2.52*   Urine Drug Screen: Drugs of Abuse     Component Value Date/Time   LABOPIA NONE DETECTED 08/11/2011 2331   COCAINSCRNUR NONE DETECTED 08/11/2011 2331   LABBENZ NONE DETECTED 08/11/2011 2331   AMPHETMU NONE  DETECTED 08/11/2011 2331   THCU NONE DETECTED 08/11/2011 2331   LABBARB NONE DETECTED 08/11/2011 2331    Urinalysis:  Recent Labs Lab 02/21/13 0648  COLORURINE YELLOW  LABSPEC 1.020  PHURINE 6.0  GLUCOSEU NEGATIVE  HGBUR NEGATIVE  BILIRUBINUR NEGATIVE  KETONESUR NEGATIVE  PROTEINUR NEGATIVE  UROBILINOGEN 0.2  NITRITE NEGATIVE  LEUKOCYTESUR NEGATIVE   Micro Results: No results found for this or any previous visit (from the past 240 hour(s)). Studies/Results: Ct Head Wo Contrast  02/21/2013   *RADIOLOGY REPORT*  Clinical Data: Altered mental status.  CT HEAD WITHOUT CONTRAST  Technique:  Contiguous axial images were obtained from the base of the skull through the vertex without contrast.  Comparison: Head CT 01/28/2013.  Findings: Moderate cerebral and cerebellar atrophy is again noted. There are extensive patchy and confluent areas of decreased attenuation throughout the deep and periventricular white matter of the cerebral hemispheres bilaterally, compatible with advanced chronic microvascular ischemic disease. No acute intracranial abnormalities.  Specifically, no evidence of acute intracranial hemorrhage, no definite findings of acute/subacute cerebral ischemia, no mass, mass effect, hydrocephalus or abnormal intra or extra-axial fluid collections.  Visualized paranasal sinuses and mastoids are well pneumatized.  No acute displaced  skull fractures are identified.  IMPRESSION: 1.  No acute intracranial abnormalities. 2.  Moderate cerebral and cerebellar atrophy and advanced chronic microvascular ischemic disease of the cerebral white matter redemonstrated, as above.   Original Report Authenticated By: Trudie Reed, M.D.   Medications: I have reviewed the patient's current medications. Scheduled Meds: . docusate sodium  100 mg Oral BID  . sodium chloride  3 mL Intravenous Q12H  . Warfarin - Pharmacist Dosing Inpatient   Does not apply q1800   Continuous Infusions: . sodium  chloride 1,000 mL (02/20/13 1827)   PRN Meds:.acetaminophen, acetaminophen, ondansetron (ZOFRAN) IV, ondansetron Assessment/Plan: #Bradycardia: Pt has had documented bradycardia on previous admission in may but nothing previously. Pt was started on aricept that can cause conduction abnormalities and exacerbate underlying sick sinus syndromes. Last echo in 2011 EF 55%, no WMA, parasternal RV dilated and hypokinetic. Pt has had cath in 2008 showed nonobstructive CAD with diffuse plaque no stents placed.  -hold aricept  -tele  -serial EKGs  -unclear whether pt is symptomatic or not at this time as pt not able to fully participate in discussion given dementia. Therefore will discuss with with daughter in terms of pursing cardiology evaluation and possibility of pacemaker and if these are in line with wishes of pt  #AMS-resolving: full workup has not yielded infectious, endocrine, toxin, vascular, metabolic, or other etiology. Pt is pleasant and oriented to self which appears to be new baseline? Would need to confirm with daughter. CT head negative. pts VSS overnight.  -if febrile broad Abx and panculture  -cont to monitor  #Pulmonary embolism: Pt is on chronic coumadin. Some concern for subdural but CT head negative will then restart anticoagulation. This could be discussed with daughter given recent fall risk.   -cont warfarin   #Dementia: neurological workup by Dr. Pearlean Brownie and presumed [place] dementia started aricpet 02/05/13. This is currently being held. Will need to speak to primary caregiver in terms of goals of care.   Dispo: Disposition is deferred at this time, awaiting improvement of current medical problems.  Anticipated discharge in approximately 1-2 day(s).   The patient does have a current PCP (SHARDA, NEEMA, MD), therefore will be requiring OPC follow-up after discharge.   The patient does not have transportation limitations that hinder transportation to clinic appointments.  .Services  Needed at time of discharge: Y = Yes, Blank = No PT:   OT:   RN:   Equipment:   Other:     LOS: 1 day   Christen Bame, MD 02/21/2013, 8:17 AM Pgr: [phone]

## 2013-02-21 NOTE — Progress Notes (Signed)
CSW received referral for SNF placement. CSW will meet with the pt and/or family to search for placement.  Caidin Heidenreich, LCSW-A Clinical Social Worker [phone]   

## 2013-02-21 NOTE — Progress Notes (Signed)
INTERNAL MEDICINE TEACHING ATTENDING ADDENDUM: I discussed this case with Dr. Ziemer soon after the patient visit. I agree with her HPI, exam findings and diagnoses. I have read her documentation and I agree with her plan of care. Please see the resident note above for details of management.    

## 2013-02-21 NOTE — H&P (Signed)
Internal Medicine Attending Admission Note Date: 02/21/2013  Patient name: [name] [name] Medical record number: [id number] Date of birth: Dec 11, 1930 Age: 79 y.o. Gender: male  I saw and evaluated the patient. I reviewed the resident's note and I agree with the resident's findings and plan as documented in the resident's note.  Chief Complaint(s): Generalized weakness  History - key components related to admission: [name] [name] is a 79 year old with past medical history most significant for history of unprovoked pulmonary embolism, hypertension, stroke, prostate cancer, nonobstructive coronary artery disease, hyperlipidemia and Alzheimer's dementia who is on chronic anticoagulation and was recently started on Aricept for treatment of dementia by his neurologist was brought in to the clinic yesterday with chief complaints of progressive weakness. Patient was living with his wife until 2 months ago when they started living with their daughter who is the primary caretaker and power of attorney. Most of the history has been obtained from his daughter. Patient was seen in the emergency room on May 10 for acute encephalopathy and confusion which was attributed to use of alprazolam at nighttime for sleep. Lab tests were negative for any electrolyte abnormalities and head imaging was suggestive of extensive chronic microvascular ischemic changes.  Patient had a followup appointment next week with his neurologist, but his increase in confusion was unchanged and he was started on Aricept at that visit. Been on Aricept for 2-1/2 weeks now patient has not had any improvement in his dementia in fact it has been noted that patient is increasingly more confused and has had progressive decline in mental function. The decline has been worsening especially over this past weekend when patient's gait was unsteady and he had difficulty walking. Her daughter also reported anorexia over last several weeks. Patient also has  urinary incontinence and pain all over the body. Patient was admitted for evaluation of causes for acute worsening of his chronic dementia.  Patient denies any fevers, chest pain, abdominal pain, change in bowel movements, change in sleeping habits, weakness or numbness in any particular part of the body.  Review of systems was found to be positive for shortness of breath, swelling in his feet.  15 point review of systems was negative except what is noted above in the history of present illness.  Past medical history, past surgical history, medications, family history and social history was reviewed and is as per resident's note.  Physical Exam - key components related to admission:  Filed Vitals:   02/20/13 1837 02/20/13 1838 02/20/13 2059 02/21/13 0559  BP: 122/87 136/86 103/64 104/58  Pulse: 55  54 53  Temp:   97.3 F (36.3 C) 98.1 F (36.7 C)  TempSrc:   Axillary Axillary  Resp:      Height:      Weight:    163 lb 11.2 oz (74.254 kg)  SpO2:   95% 95%  Physical Exam: General: Vital signs reviewed and noted. Well-developed, well-nourished, in no acute distress; alert, appropriate and cooperative throughout examination.  Head: Normocephalic, atraumatic.  Eyes: PERRL, EOMI, No signs of anemia or jaundince.  Nose: Mucous membranes moist, not inflammed, nonerythematous.  Throat: Oropharynx nonerythematous, no exudate appreciated.   Neck: No deformities, masses, or tenderness noted.Supple, No carotid Bruits, no JVD.  Lungs:  Normal respiratory effort. Clear to auscultation BL without crackles or wheezes.  Heart:  bradycardia noted, regular rhythm, S1 and S2 normal without gallop, murmur, or rubs.  Abdomen:  BS normoactive. Soft, Nondistended, non-tender.  No masses or organomegaly.  Extremities: No pretibial  edema.  Neurologic:  oriented to time place and person, unable to participate for full neurological exam. Grossly patient's motor strength is 5 out of 5 in both upper and lower  extremities, reflexes are 2+, cranial nerves 2-12 grossly intact, sensations are intact   Skin: No visible rashes, scars.   Lab results:  Basic Metabolic Panel:  Recent Labs  16/10/96 1955  NA 142  K 3.7  CL 110  CO2 23  GLUCOSE 110*  BUN 14  CREATININE 1.06  CALCIUM 9.0  MG 1.6   Liver Function Tests:  Recent Labs  02/20/13 1955  AST 35  ALT 28  ALKPHOS 56  BILITOT 0.3  PROT 6.4  ALBUMIN 3.2*   CBC:  Recent Labs  02/20/13 1955  WBC 3.0*  NEUTROABS 1.3*  HGB 11.4*  HCT 34.5*  MCV 90.3  PLT 141*   Cardiac Enzymes:  Recent Labs  02/20/13 1954 02/21/13 0145  TROPONINI <0.30 <0.30   Thyroid Function Tests:  Recent Labs  02/20/13 1955  TSH 0.739   Coagulation:  Recent Labs  02/20/13 1955  INR 2.52*   Imaging results:  Ct Head Wo Contrast  02/21/2013   *RADIOLOGY REPORT*  Clinical Data: Altered mental status.  CT HEAD WITHOUT CONTRAST  Technique:  Contiguous axial images were obtained from the base of the skull through the vertex without contrast.  Comparison: Head CT 01/28/2013.  Findings: Moderate cerebral and cerebellar atrophy is again noted. There are extensive patchy and confluent areas of decreased attenuation throughout the deep and periventricular white matter of the cerebral hemispheres bilaterally, compatible with advanced chronic microvascular ischemic disease. No acute intracranial abnormalities.  Specifically, no evidence of acute intracranial hemorrhage, no definite findings of acute/subacute cerebral ischemia, no mass, mass effect, hydrocephalus or abnormal intra or extra-axial fluid collections.  Visualized paranasal sinuses and mastoids are well pneumatized.  No acute displaced skull fractures are identified.  IMPRESSION: 1.  No acute intracranial abnormalities. 2.  Moderate cerebral and cerebellar atrophy and advanced chronic microvascular ischemic disease of the cerebral white matter redemonstrated, as above.   Original Report  Authenticated By: Trudie Reed, M.D.    Other results: EKG: 60 beats per minute, bradycardia, sinus rhythm, right bundle branch block, nonspecific ST and T wave changes, sinus bradycardia with frequent PVCs noted  Assessment & Plan by Problem:  Principal Problem:   Bradycardia Active Problems:   Essential hypertension, benign   Alzheimer's dementia   Altered mental status   Weakness  Patient is a 79 year old man with past medical history as noted above who comes in with acute worsening of his chronic dementia over the last month and particularly worse over last 3-4 days. Urine analysis is negative, no upper respiratory or lower respiratory tract infection symptoms noted, no fevers and no other signs of infection are noted at this time which is usually responsible for acute worsening of dementia and chronic demented patients. Differentials also include depression and cognitive decline from acute change in surroundings since last 2 months(ever since he has been living with his daughter), side effect from starting Aricept which is known to cause or worsen bradycardia and confusion especially at the start of therapy and generalized progression of Alzheimer's dementia. Patient was also noted to have sinus bradycardia down to as low as 40 beats per minute which may be responsible for generalized weakness and unsteadiness of gait.  -We will discontinue Aricept at this time -Continue to monitor his vitals for any signs of infection -Check TSH, chest  x-ray -Discussion with the family regarding goals of care and end-of-life options -Discontinue all medications which could cause bradycardia or low blood pressure -Consider cardiology consult in outpatient if patient remains bradycardic with symptoms -Patient will also need to be evaluated by physical therapy and occupational therapy for appropriate placement  The above measures were discussed with patient's daughter who was in agreement with current  plan of care.  The rest of the medical management as per resident's note.  Lars Mage MD Faculty-Internal Medicine Residency Program

## 2013-02-21 NOTE — Evaluation (Signed)
Physical Therapy Evaluation Patient Details Name: [name] [name] MRN: [id number] DOB: 24-Feb-1931 Today's Date: 02/21/2013 Time: 4098-1191 PT Time Calculation (min): 25 min  PT Assessment / Plan / Recommendation Clinical Impression  Pt is a 79 y.o. male with hx of recent falls who presented to [facility] with bradycardia.  Pt demonstrates deficits in functional mobility as indicated. Pt may benefit from skilled PT to address deficits and maximize functional mobility prior to dc.  Rec ST SNF to ensure independence for return home with family.     PT Assessment  Patient needs continued PT services    Follow Up Recommendations  SNF;Supervision/Assistance - 24 hour    Does the patient have the potential to tolerate intense rehabilitation      Barriers to Discharge Inaccessible home environment (16 steps to bedroom)      Equipment Recommendations  None recommended by PT       Frequency Min 2X/week    Precautions / Restrictions Precautions Precautions: Fall   Pertinent Vitals/Pain 6/10 (right hip)      Mobility  Bed Mobility Bed Mobility: Supine to Sit;Sitting - Scoot to Edge of Bed Supine to Sit: 4: Min assist Sitting - Scoot to [place] of Bed: 4: Min assist Details for Bed Mobility Assistance: Assist for trunk elevation and rotation Transfers Transfers: Sit to Stand;Stand to Sit Sit to Stand: 4: Min assist;From bed Stand to Sit: 4: Min guard;To chair/3-in-1 Details for Transfer Assistance: VCs for hand placement and upright posture Ambulation/Gait Ambulation/Gait Assistance: 4: Min guard;4: Min assist Ambulation Distance (Feet): 220 Feet Assistive device: Rolling walker (used RW for 160 ft, then HHA for 40 ft and no device 20 ft) Ambulation/Gait Assistance Details: Initally unsteady, but as continued to amb. patient demonstrated improvements in stability. Pt with some notable balance checks but able to correct with minmal assistance.  Gait Pattern: Step-through pattern;Decreased  stride length;Trunk flexed;Narrow base of support Gait velocity: decreased; required VCs for normalized cadence General Gait Details: some instability noted with ambulation Stairs: No    Exercises     PT Diagnosis: Difficulty walking;Abnormality of gait;Acute pain  PT Problem List: Decreased activity tolerance;Decreased balance;Decreased mobility;Cardiopulmonary status limiting activity;Pain PT Treatment Interventions: DME instruction;Gait training;Stair training;Functional mobility training;Therapeutic activities;Therapeutic exercise;Balance training;Patient/family education   PT Goals Acute Rehab PT Goals PT Goal Formulation: With patient Time For Goal Achievement: 03/07/13 Potential to Achieve Goals: Good Pt will go Sit to Stand: Independently PT Goal: Sit to Stand - Progress: Goal set today Pt will Ambulate: >150 feet;Independently PT Goal: Ambulate - Progress: Goal set today Pt will Go Up / Down Stairs: Flight;with modified independence PT Goal: Up/Down Stairs - Progress: Goal set today Pt will Perform Home Exercise Program: with supervision, verbal cues required/provided PT Goal: Perform Home Exercise Program - Progress: Goal set today  Visit Information  Last PT Received On: 02/21/13 Assistance Needed: +1    Subjective Data  Subjective: I may need help Patient Stated Goal: to go home   Prior Functioning  Home Living Lives With: Family Available Help at Discharge: Family;Available 24 hours/day Type of Home: House Home Access: Stairs to enter [employer] of Steps: 4 Entrance Stairs-Rails: Right;Left Home Layout: Two level;Bed/bath upstairs Alternate Level Stairs-Number of Steps: 16 Alternate Level Stairs-Rails: Right Bathroom Shower/Tub: Health visitor: Standard Prior Function Level of Independence: Independent Able to Take Stairs?: Yes Driving: Yes Vocation: Retired Musician: No difficulties Dominant Hand: Right     Cognition  Cognition Arousal/Alertness: Awake/alert Behavior During Therapy: WFL for tasks  assessed/performed Overall Cognitive Status: Within Functional Limits for tasks assessed    Extremity/Trunk Assessment Right Upper Extremity Assessment RUE ROM/Strength/Tone: [facility] for tasks assessed Left Upper Extremity Assessment LUE ROM/Strength/Tone: WFL for tasks assessed Right Lower Extremity Assessment RLE ROM/Strength/Tone: WFL for tasks assessed (generalized weakness) Left Lower Extremity Assessment LLE ROM/Strength/Tone: WFL for tasks assessed (generalized weakness)   Balance Balance Balance Assessed: Yes High Level Balance High Level Balance Activites: Side stepping;Backward walking;Direction changes;Turns;Head turns High Level Balance Comments: instability noted  End of Session PT - End of Session Equipment Utilized During Treatment: Gait belt Activity Tolerance: Patient tolerated treatment well Patient left: in chair;with call bell/phone within reach;with family/visitor present Nurse Communication: Mobility status  GP     Fabio Asa 02/21/2013, 2:23 PM Charlotte Crumb, PT DPT  [phone]

## 2013-02-21 NOTE — Progress Notes (Signed)
ANTICOAGULATION CONSULT NOTE - Initial Consult  Pharmacy Consult for Coumadin Indication: h/o PE  Allergies  Allergen Reactions  . Other Other (See Comments)    Shrimp gives him gout    Patient Measurements: Height: 5\' 6"  (167.6 cm) Weight: 162 lb 1.6 oz (73.528 kg) IBW/kg (Calculated) : 63.8  Vital Signs: Temp: 97.3 F (36.3 C) (06/03 2059) Temp src: Axillary (06/03 2059) BP: 103/64 mmHg (06/03 2059) Pulse Rate: 54 (06/03 2059)  Labs:  Recent Labs  02/20/13 1954 02/20/13 1955 02/21/13 0145  HGB  --  11.4*  --   HCT  --  34.5*  --   PLT  --  141*  --   APTT  --  38*  --   LABPROT  --  26.0*  --   INR  --  2.52*  --   CREATININE  --  1.06  --   TROPONINI <0.30  --  <0.30    Estimated Creatinine Clearance: 51 ml/min (by C-G formula based on Cr of 1.06).   Medical History: Past Medical History  Diagnosis Date  . PE (pulmonary embolism)     unprovoked PE completed 6 months of warfarin, warfarin d/ced 02/12/2010, repeat PE 07/10/2010 after a long car ride and now on lifelong coumadin.  [name] Hypertension   . Pituitary microadenoma     incidental finding CT 12/2009  . Cancer   . Prostate cancer   . Depression   . Heart attack   . High cholesterol   . Coronary artery disease, non-occlusive     Cath 2008 w multivessel nonobstructive CAD    Medications:  Prescriptions prior to admission  Medication Sig Dispense Refill  . amLODipine (NORVASC) 10 MG tablet Take 10 mg by mouth daily.      [name] atorvastatin (LIPITOR) 40 MG tablet Take 40 mg by mouth at bedtime.      . Cholecalciferol (VITAMIN D3) 2000 UNITS TABS Take by mouth.      . donepezil (ARICEPT) 5 MG tablet Take 5 mg by mouth at bedtime.      . hydrochlorothiazide (MICROZIDE) 12.5 MG capsule Take 12.5 mg by mouth daily.      [name] ibuprofen (ADVIL,MOTRIN) 200 MG tablet Take 800 mg by mouth 2 (two) times daily as needed for pain. For pain      . lisinopril (PRINIVIL,ZESTRIL) 40 MG tablet Take 40 mg by mouth daily.       . Melatonin 5 MG TABS Take 5 mg by mouth at bedtime as needed (for sleep).      . Multiple Vitamins-Minerals (MULTIVITAMIN WITH MINERALS) tablet Take 1 tablet by mouth daily.      [name] omeprazole (PRILOSEC) 20 MG capsule Take 20 mg by mouth daily.      [name] pyridOXINE (VITAMIN B-6) 100 MG tablet Take 100 mg by mouth daily.      . ramelteon (ROZEREM) 8 MG tablet Take 8 mg by mouth at bedtime.      [name] warfarin (COUMADIN) 5 MG tablet Take 5-7.5 mg by mouth daily. Take 7.5mg  on Monday, Wednesday, and Friday. Take 5mg  on all other days        Assessment: 79 yo male admitted with AMS, h/o PE, to continue Coumadin  Goal of Therapy:  INR 2-3 Monitor platelets by anticoagulation protocol: Yes   Plan:  Daily INR  [name] [name], [name] [name] 02/21/2013,3:15 AM

## 2013-02-21 NOTE — Progress Notes (Signed)
INITIAL NUTRITION ASSESSMENT  DOCUMENTATION CODES Per approved criteria  -Not Applicable   INTERVENTION:  Ensure Complete twice daily (350 kcals, 13 gm protein per 8 fl oz bottle) RD to follow for nutrition care plan  NUTRITION DIAGNOSIS: Inadequate oral intake related to decreased appetite as evidenced by daughter report  Goal: Oral intake with meals & supplements to meet >/= 90% of estimated nutrition needs  Monitor:  PO & supplemental intake, weight, labs, I/O's  Reason for Assessment: Malnutrition Screening Tool Report  79 y.o. male  Admitting Dx: Bradycardia  ASSESSMENT: Patient with history of PE on chronic anticoagulation, HTN, remote CVA, prostate cancer, nonobstructive CAD, and dementia who presented for AMS, anorexia, and weakness.   RD spoke with patient's daughter at bedside; reports patient's appetite has been decreased given "a lot going on;" she mentioned her Dad had moved in with her and he was going through some depression; no % PO intake documented at this time; patient agreeable to Ensure supplements ---> RD to order.  Height: Ht Readings from Last 1 Encounters:  02/20/13 5\' 6"  (1.676 m)    Weight: Wt Readings from Last 1 Encounters:  02/21/13 163 lb 11.2 oz (74.254 kg)    Ideal Body Weight: 142 lb  % Ideal Body Weight: 115%  Wt Readings from Last 10 Encounters:  02/21/13 163 lb 11.2 oz (74.254 kg)  02/20/13 163 lb (73.936 kg)  02/05/13 153 lb (69.4 kg)  02/05/13 170 lb (77.111 kg)  01/25/13 159 lb 11.2 oz (72.439 kg)  01/19/13 160 lb 11.2 oz (72.893 kg)  10/23/12 161 lb 8 oz (73.256 kg)  06/30/12 164 lb 11.2 oz (74.707 kg)  01/20/12 172 lb 1.6 oz (78.064 kg)  12/01/11 170 lb 3.2 oz (77.202 kg)    Usual Body Weight: 170 lb  % Usual Body Weight: 96%  BMI:  Body mass index is 26.43 kg/(m^2).  Estimated Nutritional Needs: Kcal: 1700-1900 Protein: 80-90 gm Fluid: 1.7-1.9 L  Skin: Intact  Diet Order: General  EDUCATION NEEDS: -No  education needs identified at this time   Intake/Output Summary (Last 24 hours) at 02/21/13 1110 Last data filed at 02/20/13 2200  Gross per 24 hour  Intake      0 ml  Output    300 ml  Net   -300 ml    Labs:   Recent Labs Lab 02/20/13 1955 02/21/13 0750  NA 142 141  K 3.7 3.8  CL 110 110  CO2 23 24  BUN 14 13  CREATININE 1.06 1.02  CALCIUM 9.0 8.6  MG 1.6  --   GLUCOSE 110* 87    Scheduled Meds: . docusate sodium  100 mg Oral BID  . sodium chloride  3 mL Intravenous Q12H  . Warfarin - Pharmacist Dosing Inpatient   Does not apply q1800    Continuous Infusions: . sodium chloride 1,000 mL (02/20/13 1827)    Past Medical History  Diagnosis Date  . PE (pulmonary embolism)     unprovoked PE completed 6 months of warfarin, warfarin d/ced 02/12/2010, repeat PE 07/10/2010 after a long car ride and now on lifelong coumadin.  [name] Hypertension   . Pituitary microadenoma     incidental finding CT 12/2009  . Cancer   . Prostate cancer   . Depression   . Heart attack   . High cholesterol   . Coronary artery disease, non-occlusive     Cath 2008 w multivessel nonobstructive CAD    Past Surgical History  Procedure Laterality Date  .  Prostatectomy      Maureen Chatters, RD, LDN Pager #: [phone] After-Hours Pager #: [phone]

## 2013-02-21 NOTE — Progress Notes (Signed)
OT Cancellation Note  Patient Details Name: [name] [name] MRN: [id number] DOB: [birth year]   Cancelled Treatment:    Reason Eval/Treat Not Completed:  (pt refusing).  Pt unwilling to participate in OT session at this time despite encouragement and education on role of OT. Will re-attempt next date.  02/21/2013 Cipriano Mile OTR/L Pager [phone] Office [phone]

## 2013-02-22 MED ORDER — SERTRALINE HCL 25 MG PO TABS
25.0000 mg | ORAL_TABLET | Freq: Every day | ORAL | Status: DC
  Filled 2013-02-22: qty 1

## 2013-02-22 MED ORDER — SERTRALINE HCL 25 MG PO TABS: 25.0000 mg | ORAL_TABLET | Freq: Every day | ORAL | Status: DC

## 2013-02-22 NOTE — Progress Notes (Signed)
Internal Medicine Teaching Service Attending Note Date: 02/22/2013  Patient name: [name] [name]  Medical record number: [id number]  Date of birth: December 26, 1930    This patient has been seen and discussed with the house staff. Please see their note for complete details. I concur with their findings with the following additions/corrections: patient was eating breakfast at the time of our visit without any assistance. He answered all the questions appropriately. Patient refused to work with PT/OT yesterday. Family wants to take him home with [facility] PT/OT follow up and follow up in clinic. Patient's heart rate has improved significantly after stopping aricept. Will continue to monitor symptomatically. I believe that patient will need continued PT/OT for functional improve,ent.  We will also add an antidepressant today as patient has been feeling very depressed ever since change in living situation in April which may be contributing to his current state of progressive dementia and loss of interest in daily activities.   Lars Mage 02/22/2013, 11:03 AM

## 2013-02-22 NOTE — Progress Notes (Signed)
Per family request social work did proceed with SNF at [facility]. Report called.

## 2013-02-22 NOTE — Progress Notes (Signed)
UR Completed Yaret Hush Graves-Bigelow, RN,BSN [phone]  

## 2013-02-22 NOTE — Progress Notes (Addendum)
ANTICOAGULATION CONSULT NOTE - Follow Up Consult  Pharmacy Consult for Coumadin Indication: Hx PE  Patient Measurements: Height: 5\' 6"  (167.6 cm) Weight: 163 lb 8 oz (74.163 kg) IBW/kg (Calculated) : 63.8   Vital Signs: Temp: 98.6 F (37 C) (06/05 0632) Temp src: Oral (06/05 0632) BP: 133/81 mmHg (06/05 0632) Pulse Rate: 68 (06/05 0632)   Recent Labs  02/20/13 1954 02/20/13 1955 02/21/13 0145 02/21/13 0750 02/22/13 0605  HGB  --  11.4*  --   --   --   HCT  --  34.5*  --   --   --   PLT  --  141*  --   --   --   APTT  --  38*  --   --   --   LABPROT  --  26.0*  --  25.1* 24.8*  INR  --  2.52*  --  2.41* 2.37*  CREATININE  --  1.06  --  1.02  --   TROPONINI <0.30  --  <0.30 <0.30  --    Estimated Creatinine Clearance: 53 ml/min (by C-G formula based on Cr of 1.02).  Assessment: 79yo male admitted with AMS with history of PE continuing on chronic coumadin. INR this AM 2.37.  No new CBC but he is without noted bleeding complications.  Plans for discharge as noted.  HOME Dose is 5mg  daily except 7.5mg  MWF.  Goal of Therapy:  INR 2-3 Monitor platelets by anticoagulation protocol: Yes   Plan:  1) Coumadin 7.5mg  x 1 tonight 2) F/u Daily INR  3) Monitor signs/symptoms of bleeding  Nadara Mustard, PharmD., MS Clinical Pharmacist Pager:  [phone] Thank you for allowing pharmacy to be part of this patients care team. 02/22/2013   11:42 AM  Addendum:  Provided education with patient and family regarding his Warfarin therapy.  He states he has taking it a long time.  I provided a handout to him and daughter for ongoing review.

## 2013-02-22 NOTE — Progress Notes (Signed)
Subjective: Pt had no acute events overnight. Bradycardia resolving. Daughter in room this AM. Pt very disoriented and only familiar with self. States in no pain. Discussion with daughter in regards to placement would like to go home with home health as feels pt will not be comfortable at SNF. Daughter would also like to follow up as outpt with cardiology to think about pursing bradycardia workup.   Objective: Vital signs in last 24 hours: Filed Vitals:   02/21/13 0559 02/21/13 2053 02/22/13 0632 02/22/13 0636  BP: 104/58 120/71 133/81   Pulse: 53 63 68   Temp: 98.1 F (36.7 C) 97.7 F (36.5 C) 98.6 F (37 C)   TempSrc: Axillary Oral Oral   Resp:  18 18   Height:      Weight: 163 lb 11.2 oz (74.254 kg)   163 lb 8 oz (74.163 kg)  SpO2: 95% 98% 95%    Weight change: 1 lb 6.4 oz (0.635 kg)  Intake/Output Summary (Last 24 hours) at 02/22/13 0723 Last data filed at 02/21/13 1724  Gross per 24 hour  Intake      0 ml  Output    300 ml  Net   -300 ml   General: resting in bed, NAD HEENT: PERRL, EOMI, no scleral icterus Cardiac: RRR, no rubs, murmurs or gallops Pulm: clear to auscultation bilaterally, moving normal volumes of air Abd: soft, nontender, nondistended, BS present Ext: warm and well perfused, no pedal edema Neuro: alert and oriented X3, cranial nerves II-XII grossly intact  Lab Results: Basic Metabolic Panel:  Recent Labs Lab 02/20/13 1955 02/21/13 0750  NA 142 141  K 3.7 3.8  CL 110 110  CO2 23 24  GLUCOSE 110* 87  BUN 14 13  CREATININE 1.06 1.02  CALCIUM 9.0 8.6  MG 1.6  --    Liver Function Tests:  Recent Labs Lab 02/20/13 1955  AST 35  ALT 28  ALKPHOS 56  BILITOT 0.3  PROT 6.4  ALBUMIN 3.2*   CBC:  Recent Labs Lab 02/20/13 1955  WBC 3.0*  NEUTROABS 1.3*  HGB 11.4*  HCT 34.5*  MCV 90.3  PLT 141*   Cardiac Enzymes:  Recent Labs Lab 02/20/13 1954 02/21/13 0145 02/21/13 0750  TROPONINI <0.30 <0.30 <0.30   Thyroid Function  Tests:  Recent Labs Lab 02/20/13 1955  TSH 0.739   Coagulation:  Recent Labs Lab 02/20/13 1955 02/21/13 0750 02/22/13 0605  LABPROT 26.0* 25.1* 24.8*  INR 2.52* 2.41* 2.37*   Urine Drug Screen: Drugs of Abuse     Component Value Date/Time   LABOPIA NONE DETECTED 08/11/2011 2331   COCAINSCRNUR NONE DETECTED 08/11/2011 2331   LABBENZ NONE DETECTED 08/11/2011 2331   AMPHETMU NONE DETECTED 08/11/2011 2331   THCU NONE DETECTED 08/11/2011 2331   LABBARB NONE DETECTED 08/11/2011 2331    Urinalysis:  Recent Labs Lab 02/21/13 0648  COLORURINE YELLOW  LABSPEC 1.020  PHURINE 6.0  GLUCOSEU NEGATIVE  HGBUR NEGATIVE  BILIRUBINUR NEGATIVE  KETONESUR NEGATIVE  PROTEINUR NEGATIVE  UROBILINOGEN 0.2  NITRITE NEGATIVE  LEUKOCYTESUR NEGATIVE   Micro Results: No results found for this or any previous visit (from the past 240 hour(s)). Studies/Results: Ct Head Wo Contrast  02/21/2013   *RADIOLOGY REPORT*  Clinical Data: Altered mental status.  CT HEAD WITHOUT CONTRAST  Technique:  Contiguous axial images were obtained from the base of the skull through the vertex without contrast.  Comparison: Head CT 01/28/2013.  Findings: Moderate cerebral and cerebellar atrophy is again  noted. There are extensive patchy and confluent areas of decreased attenuation throughout the deep and periventricular white matter of the cerebral hemispheres bilaterally, compatible with advanced chronic microvascular ischemic disease. No acute intracranial abnormalities.  Specifically, no evidence of acute intracranial hemorrhage, no definite findings of acute/subacute cerebral ischemia, no mass, mass effect, hydrocephalus or abnormal intra or extra-axial fluid collections.  Visualized paranasal sinuses and mastoids are well pneumatized.  No acute displaced skull fractures are identified.  IMPRESSION: 1.  No acute intracranial abnormalities. 2.  Moderate cerebral and cerebellar atrophy and advanced chronic microvascular  ischemic disease of the cerebral white matter redemonstrated, as above.   Original Report Authenticated By: Trudie Reed, M.D.   Medications: I have reviewed the patient's current medications. Scheduled Meds: . docusate sodium  100 mg Oral BID  . feeding supplement  237 mL Oral BID BM  . sodium chloride  3 mL Intravenous Q12H  . Warfarin - Pharmacist Dosing Inpatient   Does not apply q1800   Continuous Infusions: . sodium chloride Stopped (02/22/13 6045)   PRN Meds:.acetaminophen, acetaminophen, ondansetron (ZOFRAN) IV, ondansetron Assessment/Plan: #Bradycardia- resolving: Pt has had documented bradycardia on previous admission in may but nothing previously. Pt was started on aricept that can cause conduction abnormalities and exacerbate underlying sick sinus syndromes. Last echo in 2011 EF 55%, no WMA, parasternal RV dilated and hypokinetic. Pt has had cath in 2008 showed nonobstructive CAD with diffuse plaque no stents placed. No events on monitor and has resolving bradycardia likely associated with aricept.  -daughter will like to continue to wait for evaluation into possible pacemaker at this time would like to pursue outpt cardiology at a later time -tele   #AMS-resolving: full workup has not yielded infectious, endocrine, toxin, vascular, metabolic, or other etiology. Pt is pleasant and oriented to self which appears to be new baseline? Would need to confirm with daughter. CT head negative. pts VSS overnight. Likely some component of depression and [place] dementia.  -if febrile broad Abx and panculture  -add sertraline   #Pulmonary embolism: Pt is on chronic coumadin. Some concern for subdural but CT head negative will then restart anticoagulation. This could be discussed with daughter given recent fall risk.   -cont warfarin   #Dementia: neurological workup by Dr. Pearlean Brownie and presumed [place] dementia started aricpet 02/05/13. This is currently being held. Will need to speak to primary  caregiver in terms of goals of care.   Dispo: Disposition is deferred at this time, awaiting improvement of current medical problems.  Anticipated discharge in approximately 1-2 day(s).  Had extensive discussion with daughter and feels would like pt to go home with [facility] services and then see how pt does with good follow up in clinic if would need to pursue SNF placement.   The patient does have a current PCP (SHARDA, NEEMA, MD), therefore will be requiring [facility] follow-up after discharge.   The patient does not have transportation limitations that hinder transportation to clinic appointments.  .Services Needed at time of discharge: Y = Yes, Blank = No PT:   OT:   RN:   Equipment:   Other:     LOS: 2 days   Christen Bame, MD 02/22/2013, 7:23 AM Pgr: [phone]

## 2013-02-22 NOTE — Progress Notes (Signed)
Clinical Social Work Department BRIEF PSYCHOSOCIAL ASSESSMENT 02/22/2013  Patient:  [name] [name],[name] [name]     Account Number:  [id number]     Admit date:  02/20/2013  Clinical Social Worker:  Kirke Shaggy  Date/Time:  02/21/2013 02:50 PM  Referred by:  Physician  Date Referred:  02/21/2013 Referred for  SNF Placement   Other Referral:   Interview type:  Patient Other interview type:   CSW also spoke with one of the pt's dtr's Vikki Ports.    PSYCHOSOCIAL DATA Living Status:  FAMILY Admitted from facility:   Level of care:   Primary support name:   Primary support relationship to patient:   Degree of support available:   Pretty good.    CURRENT CONCERNS Current Concerns  Post-Acute Placement   Other Concerns:    SOCIAL WORK ASSESSMENT / PLAN CSW met with the pt and dtr at bedside to discuss SNF placement.  The dtr and the pt's family feel that the pt could benefit from PT rehab.   Assessment/plan status:   Other assessment/ plan:   Information/referral to community resources:   A list of SNF's for [facility] and surrounding county's were given to the pt and family to look over to choose a SNF.    PATIENT'S/FAMILY'S RESPONSE TO PLAN OF CARE: Pt and family are on board for [facility]. Pt will go to [facility] today.   Sherald Barge, LCSW-A Clinical Social Worker [phone]

## 2013-02-22 NOTE — Progress Notes (Signed)
Clinical Social Work Department CLINICAL SOCIAL WORK PLACEMENT NOTE 02/22/2013  Patient:  [name],[name]  Account Number:  [id number] Admit date:  02/20/2013  Clinical Social Worker:  Sherald Barge, Theresia Majors  Date/time:  02/21/2013 03:04 PM  Clinical Social Work is seeking post-discharge placement for this patient at the following level of care:   SKILLED NURSING   (*CSW will update this form in Epic as items are completed)   02/21/2013  Patient/family provided with [facility] Department of Clinical Social Work's list of facilities offering this level of care within the geographic area requested by the patient (or if unable, by the patient's family).  02/21/2013  Patient/family informed of their freedom to choose among providers that offer the needed level of care, that participate in Medicare, Medicaid or managed care program needed by the patient, have an available bed and are willing to accept the patient.  02/21/2013  Patient/family informed of [facility]' ownership interest in [facility], as well as of the fact that they are under no obligation to receive care at this facility.  PASARR submitted to EDS on 02/21/2013 PASARR number received from EDS on 02/21/2013  FL2 transmitted to all facilities in geographic area requested by pt/family on  02/21/2013 FL2 transmitted to all facilities within larger geographic area on 02/21/2013  Patient informed that his/her managed care company has contracts with or will negotiate with  certain facilities, including the following:     Patient/family informed of bed offers received:  02/22/2013 Patient chooses bed at [facility] Physician recommends and patient chooses bed at    Patient to be transferred to [facility] on  02/22/2013 Patient to be transferred to facility by [facility]     Additional Comments:  Sherald Barge, LCSW-A Clinical Social  Worker [phone]

## 2013-02-22 NOTE — Discharge Summary (Signed)
Name: [name] [name] MRN: [id number] DOB: Jul 02, 1931 79 y.o. PCP: Belia Heman, MD  Date of Admission: 02/20/2013  5:33 PM Date of Discharge: 02/22/2013 Attending Physician: Lars Mage, MD  Discharge Diagnosis: 1. AMS 2. Bradycardia 3. AZ Dementia 4. Insomnia 5. Depression  Discharge Medications:   Medication List    STOP taking these medications       donepezil 5 MG tablet  Commonly known as:  ARICEPT     ramelteon 8 MG tablet  Commonly known as:  ROZEREM      TAKE these medications       amLODipine 10 MG tablet  Commonly known as:  NORVASC  Take 10 mg by mouth daily.     atorvastatin 40 MG tablet  Commonly known as:  LIPITOR  Take 40 mg by mouth at bedtime.     hydrochlorothiazide 12.5 MG capsule  Commonly known as:  MICROZIDE  Take 12.5 mg by mouth daily.     ibuprofen 200 MG tablet  Commonly known as:  ADVIL,MOTRIN  Take 800 mg by mouth 2 (two) times daily as needed for pain. For pain     lisinopril 40 MG tablet  Commonly known as:  PRINIVIL,ZESTRIL  Take 40 mg by mouth daily.     Melatonin 5 MG Tabs  Take 5 mg by mouth at bedtime as needed (for sleep).     multivitamin with minerals tablet  Take 1 tablet by mouth daily.     omeprazole 20 MG capsule  Commonly known as:  PRILOSEC  Take 20 mg by mouth daily.     pyridOXINE 100 MG tablet  Commonly known as:  VITAMIN B-6  Take 100 mg by mouth daily.     sertraline 25 MG tablet  Commonly known as:  ZOLOFT  Take 1 tablet (25 mg total) by mouth daily.     Vitamin D3 2000 UNITS Tabs  Take by mouth.     warfarin 5 MG tablet  Commonly known as:  COUMADIN  Take 5-7.5 mg by mouth daily. Take 7.5mg  on Monday, Wednesday, and Friday. Take 5mg  on all other days        Disposition and follow-up:   [name] [name] was discharged from [facility] in Stable condition.  At the hospital follow up visit please address:  1.  Recently began sertraline may need titration or ask about  possible SE  2.  Labs / imaging needed at time of follow-up: repeat EKG looking for arrythmia/bradycardia  3.  Pending labs/ test needing follow-up: none  4. Consider referral to cardiology for pacemaker if family has decided  Follow-up Appointments:   Discharge Instructions:      Future Appointments Provider Department Dept Phone   02/26/2013 3:30 PM [facility] [phone]   05/28/2013 2:30 PM Micki Riley, MD [facility] [phone]      Consultations:    Procedures Performed:  Dg Chest 2 View  01/28/2013   *RADIOLOGY REPORT*  Clinical Data: Altered mental status.  CHEST - 2 VIEW  Comparison: CT chest 09/11/2012.  PA and lateral chest 10/23/2012 and plain film chest 09/11/2012.  Findings: Lung volumes are lower than on the comparison studies. There is some airspace disease in the bases, worse on the left.  No pneumothorax or pleural fluid.  Heart size normal.  IMPRESSION: Left greater than right basilar airspace opacities are likely due to atelectasis rather than pneumonia in this low-volume chest.   Original  Report Authenticated By: Holley Dexter, M.D.   Ct Head Wo Contrast  02/21/2013   *RADIOLOGY REPORT*  Clinical Data: Altered mental status.  CT HEAD WITHOUT CONTRAST  Technique:  Contiguous axial images were obtained from the base of the skull through the vertex without contrast.  Comparison: Head CT 01/28/2013.  Findings: Moderate cerebral and cerebellar atrophy is again noted. There are extensive patchy and confluent areas of decreased attenuation throughout the deep and periventricular white matter of the cerebral hemispheres bilaterally, compatible with advanced chronic microvascular ischemic disease. No acute intracranial abnormalities.  Specifically, no evidence of acute intracranial hemorrhage, no definite findings of acute/subacute cerebral ischemia, no mass, mass effect, hydrocephalus or abnormal intra or  extra-axial fluid collections.  Visualized paranasal sinuses and mastoids are well pneumatized.  No acute displaced skull fractures are identified.  IMPRESSION: 1.  No acute intracranial abnormalities. 2.  Moderate cerebral and cerebellar atrophy and advanced chronic microvascular ischemic disease of the cerebral white matter redemonstrated, as above.   Original Report Authenticated By: Trudie Reed, M.D.   Ct Head Wo Contrast  01/28/2013   *RADIOLOGY REPORT*  Clinical Data: Lethargy.  CT HEAD WITHOUT CONTRAST  Technique:  Contiguous axial images were obtained from the base of the skull through the vertex without contrast.  Comparison: Head CT scan brain MRI 04/09/2012.  Findings: Atrophy and extensive chronic microvascular ischemic change are again seen.  No evidence of acute abnormality including infarction, hemorrhage, mass lesion, mass effect, midline shift or abnormal extra-axial fluid collection is identified.  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  The calvarium is intact.  No pneumocephalus or hydrocephalus.  IMPRESSION: No acute finding.  Extensive chronic microvascular ischemic change.   Original Report Authenticated By: Holley Dexter, M.D.    Admission HPI: [name] [name] is a 79 y.o. male with history of PE on chronic anticoagulation, HTN, remote CVA, prostate cancer, nonobstructive CAD, HLD, and Alzheimer's dementia who originally presented to clinic for altered mental status, anorexia, and weakness. Given significant dementia most of hx were obtained from chart review and information gathered at clinic encounter. The daughter reports that over the past month, she has noticed worsening disorientation in her father that seems more accelerated than his chronic dementia. This disorientation has worsened over the past weekend, when she noted that he seemed more confused than usual and had an unsteady gait. She says he has not fallen, but sometimes he has difficulty picking up  his feet and walking on his own. She's concerned that he will fall. She also reports that he has not been eating or drinking over the past few days. He continues to have urinary incontinence and she is not sure how often he is using the bathroom or if he is having urinary retention. She says that he complains of pain all over his body. She thinks he may be more short of breath. She says his feet appear more swollen than usual.  She took him to the emergency room on May 10th for confusion. At that time, his confusion was attributed to Xanax which he was given at night to help him sleep. Since that time, he has not been taking Xanax and is still confused.  At baseline, patient has had a gradual cognitive decline. He's been evaluated by neurology and recently saw Dr. Pearlean Brownie. He was diagnosed with Alzheimer's dementia and started on donepezil. His daughter reports that prior to this most recent deterioration, he was walking on his without any assistive devices.   Hospital  Course by problem list: #Bradycardia: Pt has had documented bradycardia on previous admission 01/27/13 but nothing previously. Pt was started on aricept that can cause conduction abnormalities and exacerbate underlying sick sinus syndromes. Last echo in 2011 EF 55%, no WMA, parasternal RV dilated and hypokinetic. Pt has had cath in 2008 showed nonobstructive CAD with diffuse plaque no stents placed. No events on monitor and has resolving bradycardia likely associated with aricept. Daughter will like to continue to wait for evaluation into possible pacemaker at this time would like to pursue outpt cardiology at a later time   #AMS: full workup has not yielded infectious, endocrine, toxin, vascular, metabolic, or other etiology. Pt is pleasant and oriented to self which appears to be new baseline? Would need to confirm with daughter. CT head negative. pts VSS overnight. Likely some component of depression and AZ dementia. Pt was started on  sertraline 25mg  this can be titrated as outpt.   #Pulmonary embolism: Pt is on chronic coumadin. Some concern for subdural but CT head negative will then restart anticoagulation. This could be discussed with daughter given recent fall risk. But warfarin was continued upon d/c.   #Dementia: neurological workup by Dr. Pearlean Brownie and presumed AZ dementia started aricpet 02/05/13. This is currently being held. Will need to speak to primary caregiver in terms of goals of care.   #insomnia: per the daughter pt continues to have trouble sleeping at night. This is probably multifactorial given problems with reorientation of day/night schedule, recent move, and possible concomitant depression. Pt takes melatonin at home and had problems with Xanax previously therefore nothing else was started. May need to discuss again with family outpt.    Discharge Vitals:  BP 133/81  Pulse 68  Temp(Src) 98.6 F (37 C) (Oral)  Resp 18  Ht 5\' 6"  (1.676 m)  Wt 163 lb 8 oz (74.163 kg)  BMI 26.4 kg/m2  SpO2 95% General: resting in bed, NAD  HEENT: PERRL, EOMI, no scleral icterus  Cardiac: RRR, no rubs, murmurs or gallops  Pulm: clear to auscultation bilaterally, moving normal volumes of air  Abd: soft, nontender, nondistended, BS present  Ext: warm and well perfused, no pedal edema  Neuro: alert and oriented X3, cranial nerves II-XII grossly intact  Discharge Labs:  Results for orders placed during the hospital encounter of 02/20/13 (from the past 24 hour(s))  PROTIME-INR     Status: Abnormal   Collection Time    02/22/13  6:05 AM      Result Value Range   Prothrombin Time 24.8 (*) 11.6 - 15.2 seconds   INR 2.37 (*) 0.00 - 1.49   Recent Results (from the past 2160 hour(s))  POCT INR     Status: None   Collection Time    11/27/12 12:09 PM      Result Value Range   INR 2.40    POCT INR     Status: None   Collection Time    01/01/13  3:09 PM      Result Value Range   INR 2.50    POCT INR     Status: None    Collection Time    01/19/13  2:51 PM      Result Value Range   INR 2.1    POCT URINALYSIS DIPSTICK     Status: None   Collection Time    01/19/13  2:51 PM      Result Value Range   Color, UA yellow     Clarity, UA  clear     Glucose, UA negative     Bilirubin, UA negative     Ketones, UA negative     Spec Grav, UA 1.020     Blood, UA trace-intact     pH, UA 7.0     Protein, UA negative     Urobilinogen, UA 0.2     Nitrite, UA negative     Leukocytes, UA Negative    CBC WITH DIFFERENTIAL     Status: Abnormal   Collection Time    01/19/13  2:52 PM      Result Value Range   WBC 3.3 (*) 4.0 - 10.5 K/uL   RBC 4.24  4.22 - 5.81 MIL/uL   Hemoglobin 12.6 (*) 13.0 - 17.0 g/dL   HCT 47.8 (*) 29.5 - 62.1 %   MCV 87.5  78.0 - 100.0 fL   MCH 29.7  26.0 - 34.0 pg   MCHC 34.0  30.0 - 36.0 g/dL   RDW 30.8  65.7 - 84.6 %   Platelets 152  150 - 400 K/uL   Neutrophils Relative % 56  43 - 77 %   Neutro Abs 2.0  1.7 - 7.7 K/uL   Lymphocytes Relative 33  12 - 46 %   Lymphs Abs 1.1  0.7 - 4.0 K/uL   Monocytes Relative 7  3 - 12 %   Monocytes Absolute 0.2  0.1 - 1.0 K/uL   Eosinophils Relative 3  0 - 5 %   Eosinophils Absolute 0.1  0.0 - 0.7 K/uL   Basophils Relative 1  0 - 1 %   Basophils Absolute 0.0  0.0 - 0.1 K/uL   Smear Review Criteria for review not met    VITAMIN B12     Status: None   Collection Time    01/19/13  2:52 PM      Result Value Range   Vitamin B-12 429  211 - 911 pg/mL  BASIC METABOLIC PANEL     Status: None   Collection Time    01/19/13  2:52 PM      Result Value Range   Sodium 140  135 - 145 mEq/L   Potassium 3.9  3.5 - 5.3 mEq/L   Chloride 102  96 - 112 mEq/L   CO2 27  19 - 32 mEq/L   Glucose, Bld 96  70 - 99 mg/dL   BUN 15  6 - 23 mg/dL   Creat 9.62  9.52 - 8.41 mg/dL   Calcium 9.7  8.4 - 32.4 mg/dL  CBC     Status: Abnormal   Collection Time    01/27/13 11:07 PM      Result Value Range   WBC 3.7 (*) 4.0 - 10.5 K/uL   RBC 3.77 (*) 4.22 - 5.81 MIL/uL    Hemoglobin 11.3 (*) 13.0 - 17.0 g/dL   HCT 40.1 (*) 02.7 - 25.3 %   MCV 89.7  78.0 - 100.0 fL   MCH 30.0  26.0 - 34.0 pg   MCHC 33.4  30.0 - 36.0 g/dL   RDW 66.4 (*) 40.3 - 47.4 %   Platelets 152  150 - 400 K/uL  COMPREHENSIVE METABOLIC PANEL     Status: Abnormal   Collection Time    01/27/13 11:07 PM      Result Value Range   Sodium 141  135 - 145 mEq/L   Potassium 3.6  3.5 - 5.1 mEq/L   Chloride 105  96 - 112 mEq/L  CO2 28  19 - 32 mEq/L   Glucose, Bld 107 (*) 70 - 99 mg/dL   BUN 18  6 - 23 mg/dL   Creatinine, Ser 1.61 (*) 0.50 - 1.35 mg/dL   Calcium 8.9  8.4 - 09.6 mg/dL   Total Protein 6.8  6.0 - 8.3 g/dL   Albumin 3.7  3.5 - 5.2 g/dL   AST 46 (*) 0 - 37 U/L   ALT 30  0 - 53 U/L   Alkaline Phosphatase 54  39 - 117 U/L   Total Bilirubin 0.5  0.3 - 1.2 mg/dL   GFR calc non Af Amer 41 (*) >90 mL/min   GFR calc Af Amer 47 (*) >90 mL/min   Comment:            The eGFR has been calculated     using the CKD EPI equation.     This calculation has not been     validated in all clinical     situations.     eGFR's persistently     <90 mL/min signify     possible Chronic Kidney Disease.  GLUCOSE, CAPILLARY     Status: None   Collection Time    01/28/13 12:24 AM      Result Value Range   Glucose-Capillary 97  70 - 99 mg/dL  PROTIME-INR     Status: Abnormal   Collection Time    01/28/13 12:37 AM      Result Value Range   Prothrombin Time 21.5 (*) 11.6 - 15.2 seconds   INR 1.95 (*) 0.00 - 1.49  TROPONIN I     Status: None   Collection Time    01/28/13  1:58 AM      Result Value Range   Troponin I <0.30  <0.30 ng/mL   Comment:            Due to the release kinetics of cTnI,     a negative result within the first hours     of the onset of symptoms does not rule out     myocardial infarction with certainty.     If myocardial infarction is still suspected,     repeat the test at appropriate intervals.  URINALYSIS, ROUTINE W REFLEX MICROSCOPIC     Status: None    Collection Time    01/28/13  4:19 AM      Result Value Range   Color, Urine YELLOW  YELLOW   APPearance CLEAR  CLEAR   Specific Gravity, Urine 1.013  1.005 - 1.030   pH 6.0  5.0 - 8.0   Glucose, UA NEGATIVE  NEGATIVE mg/dL   Hgb urine dipstick NEGATIVE  NEGATIVE   Bilirubin Urine NEGATIVE  NEGATIVE   Ketones, ur NEGATIVE  NEGATIVE mg/dL   Protein, ur NEGATIVE  NEGATIVE mg/dL   Urobilinogen, UA 0.2  0.0 - 1.0 mg/dL   Nitrite NEGATIVE  NEGATIVE   Leukocytes, UA NEGATIVE  NEGATIVE   Comment: MICROSCOPIC NOT DONE ON URINES WITH NEGATIVE PROTEIN, BLOOD, LEUKOCYTES, NITRITE, OR GLUCOSE <1000 mg/dL.  POCT INR     Status: None   Collection Time    01/29/13  4:41 PM      Result Value Range   INR 2.30    TROPONIN I     Status: None   Collection Time    02/20/13  7:54 PM      Result Value Range   Troponin I <0.30  <0.30 ng/mL   Comment:  Due to the release kinetics of cTnI,     a negative result within the first hours     of the onset of symptoms does not rule out     myocardial infarction with certainty.     If myocardial infarction is still suspected,     repeat the test at appropriate intervals.  COMPREHENSIVE METABOLIC PANEL     Status: Abnormal   Collection Time    02/20/13  7:55 PM      Result Value Range   Sodium 142  135 - 145 mEq/L   Potassium 3.7  3.5 - 5.1 mEq/L   Chloride 110  96 - 112 mEq/L   CO2 23  19 - 32 mEq/L   Glucose, Bld 110 (*) 70 - 99 mg/dL   BUN 14  6 - 23 mg/dL   Creatinine, Ser 1.61  0.50 - 1.35 mg/dL   Calcium 9.0  8.4 - 09.6 mg/dL   Total Protein 6.4  6.0 - 8.3 g/dL   Albumin 3.2 (*) 3.5 - 5.2 g/dL   AST 35  0 - 37 U/L   ALT 28  0 - 53 U/L   Alkaline Phosphatase 56  39 - 117 U/L   Total Bilirubin 0.3  0.3 - 1.2 mg/dL   GFR calc non Af Amer 65 (*) >90 mL/min   GFR calc Af Amer 75 (*) >90 mL/min   Comment:            The eGFR has been calculated     using the CKD EPI equation.     This calculation has not been     validated in all  clinical     situations.     eGFR's persistently     <90 mL/min signify     possible Chronic Kidney Disease.  MAGNESIUM     Status: None   Collection Time    02/20/13  7:55 PM      Result Value Range   Magnesium 1.6  1.5 - 2.5 mg/dL  APTT     Status: Abnormal   Collection Time    02/20/13  7:55 PM      Result Value Range   aPTT 38 (*) 24 - 37 seconds   Comment:            IF BASELINE aPTT IS ELEVATED,     SUGGEST PATIENT RISK ASSESSMENT     BE USED TO DETERMINE APPROPRIATE     ANTICOAGULANT THERAPY.  PROTIME-INR     Status: Abnormal   Collection Time    02/20/13  7:55 PM      Result Value Range   Prothrombin Time 26.0 (*) 11.6 - 15.2 seconds   INR 2.52 (*) 0.00 - 1.49  TSH     Status: None   Collection Time    02/20/13  7:55 PM      Result Value Range   TSH 0.739  0.350 - 4.500 uIU/mL  CBC WITH DIFFERENTIAL     Status: Abnormal   Collection Time    02/20/13  7:55 PM      Result Value Range   WBC 3.0 (*) 4.0 - 10.5 K/uL   RBC 3.82 (*) 4.22 - 5.81 MIL/uL   Hemoglobin 11.4 (*) 13.0 - 17.0 g/dL   HCT 04.5 (*) 40.9 - 81.1 %   MCV 90.3  78.0 - 100.0 fL   MCH 29.8  26.0 - 34.0 pg   MCHC 33.0  30.0 - 36.0 g/dL  RDW 16.3 (*) 11.5 - 15.5 %   Platelets 141 (*) 150 - 400 K/uL   Neutrophils Relative % 45  43 - 77 %   Neutro Abs 1.3 (*) 1.7 - 7.7 K/uL   Lymphocytes Relative 41  12 - 46 %   Lymphs Abs 1.2  0.7 - 4.0 K/uL   Monocytes Relative 6  3 - 12 %   Monocytes Absolute 0.2  0.1 - 1.0 K/uL   Eosinophils Relative 7 (*) 0 - 5 %   Eosinophils Absolute 0.2  0.0 - 0.7 K/uL   Basophils Relative 1  0 - 1 %   Basophils Absolute 0.0  0.0 - 0.1 K/uL  TROPONIN I     Status: None   Collection Time    02/21/13  1:45 AM      Result Value Range   Troponin I <0.30  <0.30 ng/mL   Comment:            Due to the release kinetics of cTnI,     a negative result within the first hours     of the onset of symptoms does not rule out     myocardial infarction with certainty.     If  myocardial infarction is still suspected,     repeat the test at appropriate intervals.  URINALYSIS, ROUTINE W REFLEX MICROSCOPIC     Status: None   Collection Time    02/21/13  6:48 AM      Result Value Range   Color, Urine YELLOW  YELLOW   APPearance CLEAR  CLEAR   Specific Gravity, Urine 1.020  1.005 - 1.030   pH 6.0  5.0 - 8.0   Glucose, UA NEGATIVE  NEGATIVE mg/dL   Hgb urine dipstick NEGATIVE  NEGATIVE   Bilirubin Urine NEGATIVE  NEGATIVE   Ketones, ur NEGATIVE  NEGATIVE mg/dL   Protein, ur NEGATIVE  NEGATIVE mg/dL   Urobilinogen, UA 0.2  0.0 - 1.0 mg/dL   Nitrite NEGATIVE  NEGATIVE   Leukocytes, UA NEGATIVE  NEGATIVE   Comment: MICROSCOPIC NOT DONE ON URINES WITH NEGATIVE PROTEIN, BLOOD, LEUKOCYTES, NITRITE, OR GLUCOSE <1000 mg/dL.  TROPONIN I     Status: None   Collection Time    02/21/13  7:50 AM      Result Value Range   Troponin I <0.30  <0.30 ng/mL   Comment:            Due to the release kinetics of cTnI,     a negative result within the first hours     of the onset of symptoms does not rule out     myocardial infarction with certainty.     If myocardial infarction is still suspected,     repeat the test at appropriate intervals.  BASIC METABOLIC PANEL     Status: Abnormal   Collection Time    02/21/13  7:50 AM      Result Value Range   Sodium 141  135 - 145 mEq/L   Potassium 3.8  3.5 - 5.1 mEq/L   Chloride 110  96 - 112 mEq/L   CO2 24  19 - 32 mEq/L   Glucose, Bld 87  70 - 99 mg/dL   BUN 13  6 - 23 mg/dL   Creatinine, Ser 0.34  0.50 - 1.35 mg/dL   Calcium 8.6  8.4 - 74.2 mg/dL   GFR calc non Af Amer 68 (*) >90 mL/min   GFR calc Af Amer 79 (*) >  90 mL/min   Comment:            The eGFR has been calculated     using the CKD EPI equation.     This calculation has not been     validated in all clinical     situations.     eGFR's persistently     <90 mL/min signify     possible Chronic Kidney Disease.  PROTIME-INR     Status: Abnormal   Collection Time      02/21/13  7:50 AM      Result Value Range   Prothrombin Time 25.1 (*) 11.6 - 15.2 seconds   INR 2.41 (*) 0.00 - 1.49  PROTIME-INR     Status: Abnormal   Collection Time    02/22/13  6:05 AM      Result Value Range   Prothrombin Time 24.8 (*) 11.6 - 15.2 seconds   INR 2.37 (*) 0.00 - 1.49     Signed: Christen Bame, MD 02/22/2013, 10:18 AM   Time Spent on Discharge: 38 minutes Services Ordered on Discharge: none Equipment Ordered on Discharge: none

## 2013-02-22 NOTE — Care Management Note (Signed)
    Page 1 of 1   02/22/2013     11:03:25 AM   CARE MANAGEMENT NOTE 02/22/2013  Patient:  Cotterman,Waymond   Account Number:  [id number]  Date Initiated:  02/22/2013  Documentation initiated by:  GRAVES-BIGELOW,Siria Calandro  Subjective/Objective Assessment:   Pt admitted with weakness and bradycardia. Pt is asymptomatic with bradycardia. Pt was from home with daughter and Sue Lush feels that pt will now need SNF instead of [facility] services. Pt was sleeping upstairs and this will be a fall risk for pt.     Action/Plan:   Family leaning towards SNF placement d/t this will be the safest option. Pt is on bed alarm here in hospital. [facility] was called to see if family can spend the night with pt and they can. Bed is available and plan for d/c today.   Anticipated DC Date:  02/22/2013   Anticipated DC Plan:  SKILLED NURSING FACILITY      DC Planning Services  CM consult      Choice offered to / List presented to:             Status of service:  Completed, signed off Medicare Important Message given?   (If response is "NO", the following Medicare IM given date fields will be blank) Date Medicare IM given:   Date Additional Medicare IM given:    Discharge Disposition:  SKILLED NURSING FACILITY  Per UR Regulation:  Reviewed for med. necessity/level of care/duration of stay  If discussed at Long Length of Stay Meetings, dates discussed:    Comments:  02-22-13 1100 Roselyn BeringMitzie Na, RN,BSN No further needs from CM at this time.

## 2013-02-26 ENCOUNTER — Ambulatory Visit: Payer: Medicare Other

## 2013-03-01 ENCOUNTER — Telehealth: Payer: Self-pay | Admitting: *Deleted

## 2013-03-01 NOTE — Telephone Encounter (Signed)
Yes, Thank  You

## 2013-03-01 NOTE — Telephone Encounter (Signed)
Verbal approval for OT given... 1 time a week for 1 week then 2 times a week for 3 weeks then will need reapproval, this will be for multiple issues, functional, transfers, daily exercises etc.... Do you agree?

## 2013-03-05 ENCOUNTER — Telehealth: Payer: Self-pay | Admitting: Internal Medicine

## 2013-03-05 ENCOUNTER — Telehealth: Payer: Self-pay | Admitting: Pharmacist

## 2013-03-05 ENCOUNTER — Telehealth: Payer: Self-pay | Admitting: *Deleted

## 2013-03-05 NOTE — Telephone Encounter (Signed)
Contacted by Marin Roberts, RN---Triage Nurse at [facility]. She received INR results = 2.03 on patient who is taking 42.5mg /wk warfarin using 5mg  strength tablets. HHA RN has been advised to INCREASE the total weekly dose to 45mg /wk as:  Using 5mg  strength warfarin tablets--give:  1 & 1/2 tablets (7.5mg ) on M/W/F/Su; 1x5mg  strength tablets all other days. Repeat HHA RN home collected INR on Monday July 7th, 2014 and call results to [facility].

## 2013-03-05 NOTE — Telephone Encounter (Signed)
HHN, [name] [name], calls and also has lab report of PT/ INR faxed to clinic. Report is given to dr Midwife and dr Alexandria Lodge for review. Dr Alexandria Lodge printed new coumadin order sheet and next PT/ INR home check. HHN states she will probably have discharged pt by that time but will record order at [place](sp) office. Attempted to notify family member, andrea, left message for her to rtc

## 2013-03-05 NOTE — Telephone Encounter (Signed)
The patient's daughter calls stating that he was sent home from the hospital for low heart rates and now is receiving home therapy and the therapist was concerned with a heart rate of 44. He is asymptomatic and not having any dizziness, lightheadedness, confusion. He is currently being given some fluids and his daughter states that he may not have drank enough liquid today. She states that she does not currently have clinic appointment. Offered clinic visit for later this week and she was agreeable to that. Advised that if he develops symptoms she should consider bringing him to the ED or seeking medical advice by calling us back.  While on the phone we went over the new recommendations per Dr. Alexandria Lodge for coumadin dosing as she was unable to speak with him today over the phone.   Advice given for 7.5 mg coumadin (1.5 tabs) M/W/F/Sun. Advice for 5 mg Tu/Th/Sa.  Will route this message to the front desk and ask them to schedule him for later this week for an appointment.   Genella Mech 5:21 PM 03/05/2013  All medications that the patient is taking and allergies were reviewed by this provider during our conversation and reminded her if his mental status or alertness changes she should seek medical attention.

## 2013-03-08 ENCOUNTER — Telehealth: Payer: Self-pay | Admitting: *Deleted

## 2013-03-08 ENCOUNTER — Ambulatory Visit (HOSPITAL_COMMUNITY)
Admission: RE | Admit: 2013-03-08 | Discharge: 2013-03-08 | Disposition: A | Discharge status: Home / Self Care | Payer: Medicare Other | Source: Ambulatory Visit | Attending: Internal Medicine | Admitting: Internal Medicine

## 2013-03-08 ENCOUNTER — Encounter: Payer: Self-pay | Admitting: Internal Medicine

## 2013-03-08 ENCOUNTER — Ambulatory Visit (INDEPENDENT_AMBULATORY_CARE_PROVIDER_SITE_OTHER): Payer: Medicare Other | Admitting: Internal Medicine

## 2013-03-08 VITALS — BP 112/70 | HR 61 | Temp 97.6°F | Ht 66.0 in | Wt 159.8 lb

## 2013-03-08 DIAGNOSIS — F329 Major depressive disorder, single episode, unspecified: Secondary | ICD-10-CM

## 2013-03-08 DIAGNOSIS — G309 Alzheimer's disease, unspecified: Secondary | ICD-10-CM

## 2013-03-08 DIAGNOSIS — I498 Other specified cardiac arrhythmias: Secondary | ICD-10-CM

## 2013-03-08 DIAGNOSIS — I4949 Other premature depolarization: Secondary | ICD-10-CM | POA: Insufficient documentation

## 2013-03-08 DIAGNOSIS — F0280 Dementia in other diseases classified elsewhere without behavioral disturbance: Secondary | ICD-10-CM

## 2013-03-08 DIAGNOSIS — I10 Essential (primary) hypertension: Secondary | ICD-10-CM

## 2013-03-08 DIAGNOSIS — F3289 Other specified depressive episodes: Secondary | ICD-10-CM

## 2013-03-08 NOTE — Patient Instructions (Addendum)
1. Will send you for cardiology referral. 2. Please follow up with Dr. Pearlean Brownie for management for dementia. 3. folllow up with Dr. Everardo Beals  in one month.

## 2013-03-08 NOTE — Telephone Encounter (Signed)
Call from pt's daughter - wants to know if HCTZ was stopped. I talked to Dr Dierdre Searles, who saw pt this morning; she told the son to hold HCTZ, check BP BID x 2 weeks and if >140/90,needs to schedule another appt otherwise keep 7/23 appt. But still call and let MD know if below 140/90.  She agreed.

## 2013-03-09 LAB — BASIC METABOLIC PANEL WITH GFR
BUN: 15 mg/dL (ref 6–23)
CO2: 29 mEq/L (ref 19–32)
Calcium: 9.4 mg/dL (ref 8.4–10.5)
Creat: 1.19 mg/dL (ref 0.50–1.35)
GFR, Est African American: 67 mL/min
Glucose, Bld: 84 mg/dL (ref 70–99)

## 2013-03-09 NOTE — Assessment & Plan Note (Signed)
Patient and his son reports increased urinary frequency with HCTZ. No fever, chills or dysuria.  They would like to see if they could stop taking HCTZ.  - Patient is on HCTZ 12.5 mg daily, amlodipine 10 mg daily, and lisinopril 40 mg daily for antihypertensive treatment.  His blood pressures running 110s/ 70s.  His goal is less than 140/90. - I think we have some room if we temporarily hold his HCTZ. -Family and patient to hold HCTZ temporarily, and continue monitor blood pressure at home.  If his blood pressures are consistently less than 140/90 with treatment of amlodipine and lisinopril, we can stop HCTZ.

## 2013-03-09 NOTE — Progress Notes (Signed)
Case discussed with Dr. Li soon after the resident saw the patient. We reviewed the resident's history and exam and pertinent patient test results. I agree with the assessment, diagnosis, and plan of care documented in the resident's note. 

## 2013-03-09 NOTE — Progress Notes (Addendum)
Subjective:   Patient ID: [name] [name] male   DOB: 08-07-1931 79 y.o.   MRN: [id number]  HPI: [name] [name] is a 79 y.o. man with history of PE on chronic Coumadin therapy, hypertension, nonobstructive coronary artery disease, prostate cancer, and depression, who presents to the clinic for hospital followup.  Patient was admitted to the hospital for the evaluation of altered mental status and bradycardia between 6/3 and 02/22/2013. His altered mental status was thought to be related to depression and Alzheimer dementia.  Patient was started on Zoloft 25 mg.  His bradycardia was thought to be related to Aricept that " can cause conduction abnormalities and exacerbation underlying sick sinus syndromes."  Last echo in 2011 EF 55%, no WMA, parasternal RV dilated and hypokinetic. Pt has had cath in 2008 showed nonobstructive CAD with diffuse plaque no stents placed.   Patient was discharged to [facility] [place]. He was discharged home from SNF recently. Patient has no complaints.  He is accompanied by his son.  Please see my assessment and plan.  Past Medical History  Diagnosis Date  . PE (pulmonary embolism)     unprovoked PE completed 6 months of warfarin, warfarin d/ced 02/12/2010, repeat PE 07/10/2010 after a long car ride and now on lifelong coumadin.  [name] Hypertension   . Pituitary microadenoma     incidental finding CT 12/2009  . Cancer   . Prostate cancer   . Depression   . Heart attack   . High cholesterol   . Coronary artery disease, non-occlusive     Cath 2008 w multivessel nonobstructive CAD   Current Outpatient Prescriptions  Medication Sig Dispense Refill  . amLODipine (NORVASC) 10 MG tablet Take 10 mg by mouth daily.      [name] atorvastatin (LIPITOR) 40 MG tablet Take 40 mg by mouth at bedtime.      . Cholecalciferol (VITAMIN D3) 2000 UNITS TABS Take by mouth.      . hydrochlorothiazide (MICROZIDE) 12.5 MG capsule Take 12.5 mg by mouth  daily.      [name] ibuprofen (ADVIL,MOTRIN) 200 MG tablet Take 800 mg by mouth 2 (two) times daily as needed for pain. For pain      . lisinopril (PRINIVIL,ZESTRIL) 40 MG tablet Take 40 mg by mouth daily.      . Melatonin 5 MG TABS Take 5 mg by mouth at bedtime as needed (for sleep).      . Multiple Vitamins-Minerals (MULTIVITAMIN WITH MINERALS) tablet Take 1 tablet by mouth daily.      [name] omeprazole (PRILOSEC) 20 MG capsule Take 20 mg by mouth daily.      [name] pyridOXINE (VITAMIN B-6) 100 MG tablet Take 100 mg by mouth daily.      . sertraline (ZOLOFT) 25 MG tablet Take 1 tablet (25 mg total) by mouth daily.  30 tablet  1  . warfarin (COUMADIN) 5 MG tablet Take 5-7.5 mg by mouth daily. Take 7.5mg  on Monday, Wednesday, and Friday. Take 5mg  on all other days       No current facility-administered medications for this visit.   Family History  Problem Relation Age of Onset  . Hypertension Father     Passed away from cerebral hemorrhage at age of 55.  [name] Dementia Mother     Passed away  . Hypertension Child     4 adult children  . Cervical cancer Other   . Lung cancer Other   . Stroke Other  History   Social History  . Marital Status: Married    Spouse Name: N/A    Number of Children: 4  . Years of Education: master's   Occupational History  .      retired   Social History Main Topics  . Smoking status: Never Smoker   . Smokeless tobacco: None  . Alcohol Use: No  . Drug Use: No  . Sexually Active: No   Other Topics Concern  . None   Social History Narrative   Lives with daughter. Wife also has severe dementia.    Review of Systems: Review of Systems:  Constitutional:  Denies fever, chills, diaphoresis, appetite change and fatigue.   HEENT:  Denies congestion, sore throat, rhinorrhea, sneezing, mouth sores, trouble swallowing, neck pain   Respiratory:  Denies SOB, DOE, cough, and wheezing.   Cardiovascular:  Denies palpitations and leg swelling.   Gastrointestinal:  Denies  nausea, vomiting, abdominal pain, diarrhea, constipation, blood in stool and abdominal distention.   Genitourinary:  Denies dysuria, urgency, frequency, hematuria, flank pain and difficulty urinating.   Musculoskeletal:  Denies myalgias, back pain, joint swelling, arthralgias and gait problem.   Skin:  Denies pallor, rash and wound.   Neurological:  Denies dizziness, seizures, syncope, weakness, light-headedness, numbness and headaches.    .    Objective:  Physical Exam: Filed Vitals:   03/08/13 1054  BP: 112/70  Pulse: 61  Temp: 97.6 F (36.4 C)  TempSrc: Oral  Height: 5\' 6"  (1.676 m)  Weight: 159 lb 12.8 oz (72.485 kg)  SpO2: 97%   General: alert, well-developed, and cooperative to examination.  Head: normocephalic and atraumatic.  Eyes: vision grossly intact, pupils equal, pupils round, pupils reactive to light, no injection and anicteric.  Mouth: pharynx pink and moist, no erythema, and no exudates.  Neck: supple, full ROM, no thyromegaly, no JVD, and no carotid bruits.  Lungs: normal respiratory effort, no accessory muscle use, normal breath sounds, no crackles, and no wheezes. Heart: normal rate, regular rhythm, no murmur, no gallop, and no rub.  Abdomen: soft, non-tender, normal bowel sounds, no distention, no guarding, no rebound tenderness, no hepatomegaly, and no splenomegaly.  Msk: no joint swelling, no joint warmth, and no redness over joints.  Pulses: 2+ DP/PT pulses bilaterally Extremities: No cyanosis, clubbing, edema Neurologic: alert & oriented X3, cranial nerves II-XII intact, strength normal in all extremities, sensation intact to light touch, and gait normal.  Skin: turgor normal and no rashes.  Psych: Oriented X3, memory intact for recent and remote, normally interactive, good eye contact, not anxious appearing, and not depressed appearing.    Assessment & Plan:

## 2013-03-09 NOTE — Assessment & Plan Note (Signed)
Patient follows with the neurologist Dr. Pearlean Brownie for treatment and management of his dementia.  - will continue to follow up with his neurologist as scheduled.

## 2013-03-09 NOTE — Assessment & Plan Note (Signed)
Denies depressed mood.  No HI/SI.  Zoloft 25 mg daily was started upon hospital discharge in June. Patient reports medical compliance.  Patient's son states that his brother is a Teacher, early years/pre who has questions with regarding to Zoloft.  He will like to change to Prozac.   - Explanation given to patient and his son.  Prozac is known to cause QT prolongation, which will not be a good choice for him since he has pending evaluation of bradycardia. - will continue current regimen, and may need titrate during next office visit.

## 2013-03-09 NOTE — Assessment & Plan Note (Addendum)
Patient was noted to have bradycardia during hospitalization in June 2014.  His Aricept was D/C'd due to the concerning over side effect of conduction abnormality.  He denies palpitation, dizziness or lightheadedness.  Denies LOC. Endorses generalized weakness.  -EKG performed in the clinic showing sinus bradycardia with heart rate of 58, and RBBB.   No acute changes comparing to previous EKG.  - Will check BMP and magnesium level - Will send for cardiology referral given his age and significant bradycardia during last hospitalization. - Patient and his son agree with the above plan

## 2013-03-13 ENCOUNTER — Telehealth: Payer: Self-pay | Admitting: *Deleted

## 2013-03-13 NOTE — Telephone Encounter (Signed)
The nurse from [facility] called and wanted [name] to know some back pain since this AM - family aware. Will call if no change. Took OTC Ibuprofen. Stanton Kidney Kirill Chatterjee RN 03/13/13 1:50PM

## 2013-03-13 NOTE — Telephone Encounter (Signed)
Candy from Dr [facility] office called to check if pt needs to stop blood thinners due to deep cleaning of gums and fillings. Has not been sch yet.Aware Dr Alexandria Lodge is out of town and will be in clinic 03/19/13 - dentist office is aware. [phone]. Stanton Kidney Fancy Dunkley RN 03/13/13 9:30AM

## 2013-03-14 ENCOUNTER — Other Ambulatory Visit: Payer: Self-pay

## 2013-03-14 ENCOUNTER — Emergency Department (HOSPITAL_COMMUNITY)
Admission: EM | Admit: 2013-03-14 | Discharge: 2013-03-14 | Disposition: A | Discharge status: Home / Self Care | Payer: Medicare Other | Attending: Emergency Medicine | Admitting: Emergency Medicine

## 2013-03-14 ENCOUNTER — Emergency Department (HOSPITAL_COMMUNITY): Payer: Medicare Other

## 2013-03-14 ENCOUNTER — Encounter (HOSPITAL_COMMUNITY): Payer: Self-pay | Admitting: Family Medicine

## 2013-03-14 DIAGNOSIS — E7800 Pure hypercholesterolemia, unspecified: Secondary | ICD-10-CM | POA: Insufficient documentation

## 2013-03-14 DIAGNOSIS — Z79899 Other long term (current) drug therapy: Secondary | ICD-10-CM | POA: Insufficient documentation

## 2013-03-14 DIAGNOSIS — Z8709 Personal history of other diseases of the respiratory system: Secondary | ICD-10-CM | POA: Insufficient documentation

## 2013-03-14 DIAGNOSIS — I2510 Atherosclerotic heart disease of native coronary artery without angina pectoris: Secondary | ICD-10-CM | POA: Insufficient documentation

## 2013-03-14 DIAGNOSIS — Z8674 Personal history of sudden cardiac arrest: Secondary | ICD-10-CM | POA: Insufficient documentation

## 2013-03-14 DIAGNOSIS — F3289 Other specified depressive episodes: Secondary | ICD-10-CM | POA: Insufficient documentation

## 2013-03-14 DIAGNOSIS — W010XXA Fall on same level from slipping, tripping and stumbling without subsequent striking against object, initial encounter: Secondary | ICD-10-CM | POA: Insufficient documentation

## 2013-03-14 DIAGNOSIS — I10 Essential (primary) hypertension: Secondary | ICD-10-CM | POA: Insufficient documentation

## 2013-03-14 DIAGNOSIS — Z86711 Personal history of pulmonary embolism: Secondary | ICD-10-CM | POA: Insufficient documentation

## 2013-03-14 DIAGNOSIS — Y9301 Activity, walking, marching and hiking: Secondary | ICD-10-CM | POA: Insufficient documentation

## 2013-03-14 DIAGNOSIS — F329 Major depressive disorder, single episode, unspecified: Secondary | ICD-10-CM | POA: Insufficient documentation

## 2013-03-14 DIAGNOSIS — W19XXXA Unspecified fall, initial encounter: Secondary | ICD-10-CM

## 2013-03-14 DIAGNOSIS — S0990XA Unspecified injury of head, initial encounter: Secondary | ICD-10-CM | POA: Insufficient documentation

## 2013-03-14 DIAGNOSIS — Y929 Unspecified place or not applicable: Secondary | ICD-10-CM | POA: Insufficient documentation

## 2013-03-14 DIAGNOSIS — Z9861 Coronary angioplasty status: Secondary | ICD-10-CM | POA: Insufficient documentation

## 2013-03-14 DIAGNOSIS — W1809XA Striking against other object with subsequent fall, initial encounter: Secondary | ICD-10-CM | POA: Insufficient documentation

## 2013-03-14 DIAGNOSIS — Z7901 Long term (current) use of anticoagulants: Secondary | ICD-10-CM | POA: Insufficient documentation

## 2013-03-14 DIAGNOSIS — Z8546 Personal history of malignant neoplasm of prostate: Secondary | ICD-10-CM | POA: Insufficient documentation

## 2013-03-14 LAB — CBC
HCT: 32.8 % — ABNORMAL LOW (ref 39.0–52.0)
Hemoglobin: 11.1 g/dL — ABNORMAL LOW (ref 13.0–17.0)
RBC: 3.71 MIL/uL — ABNORMAL LOW (ref 4.22–5.81)
RDW: 16 % — ABNORMAL HIGH (ref 11.5–15.5)
WBC: 2.8 10*3/uL — ABNORMAL LOW (ref 4.0–10.5)

## 2013-03-14 LAB — BASIC METABOLIC PANEL
BUN: 18 mg/dL (ref 6–23)
Chloride: 104 mEq/L (ref 96–112)
GFR calc Af Amer: 78 mL/min — ABNORMAL LOW (ref >90)
Glucose, Bld: 102 mg/dL — ABNORMAL HIGH (ref 70–99)
Potassium: 3.7 mEq/L (ref 3.5–5.1)

## 2013-03-14 NOTE — ED Notes (Signed)
Per pt and family he was walking and tripped. sts fell and hit head on door. Denies LOC. sts head pain and neck pain. No bleeding. sts on Coumadin.

## 2013-03-14 NOTE — ED Notes (Signed)
C-collar applied in triage by EMT.

## 2013-03-14 NOTE — ED Notes (Signed)
Pt dc'd home w/all belongings, alert and ambulatory upon dc, wheel chair used for comfort, pt verbalizes understanding of dc instructions

## 2013-03-14 NOTE — ED Provider Notes (Signed)
History    CSN: [id number] Arrival date & time 03/14/13  1734  First MD Initiated Contact with Patient 03/14/13 1752     Chief Complaint  Patient presents with  . Fall   (Consider location/radiation/quality/duration/timing/severity/associated sxs/prior Treatment) Patient is a 79 y.o. male presenting with fall.  Fall This is a new problem. The current episode started today. Episode frequency: once. The problem has been resolved. Pertinent negatives include no abdominal pain, arthralgias, chest pain, chills, congestion, coughing, fever, headaches, joint swelling, nausea, rash, sore throat, urinary symptoms, vertigo or vomiting. Nothing aggravates the symptoms. He has tried nothing for the symptoms.   Past Medical History  Diagnosis Date  . PE (pulmonary embolism)     unprovoked PE completed 6 months of warfarin, warfarin d/ced 02/12/2010, repeat PE 07/10/2010 after a long car ride and now on lifelong coumadin.  [name] Hypertension   . Pituitary microadenoma     incidental finding CT 12/2009  . Cancer   . Prostate cancer   . Depression   . Heart attack   . High cholesterol   . Coronary artery disease, non-occlusive     Cath 2008 w multivessel nonobstructive CAD   Past Surgical History  Procedure Laterality Date  . Prostatectomy     Family History  Problem Relation Age of Onset  . Hypertension Father     Passed away from cerebral hemorrhage at age of 35.  [name] Dementia Mother     Passed away  . Hypertension Child     4 adult children  . Cervical cancer Other   . Lung cancer Other   . Stroke Other    History  Substance Use Topics  . Smoking status: Never Smoker   . Smokeless tobacco: Not on file  . Alcohol Use: No    Review of Systems  Constitutional: Negative for fever and chills.  HENT: Negative for congestion, sore throat and rhinorrhea.   Eyes: Negative for photophobia and visual disturbance.  Respiratory: Negative for cough and shortness of breath.   Cardiovascular:  Negative for chest pain and leg swelling.  Gastrointestinal: Negative for nausea, vomiting, abdominal pain, diarrhea and constipation.  Endocrine: Negative for polydipsia and polyuria.  Genitourinary: Negative for dysuria and hematuria.  Musculoskeletal: Negative for back pain, joint swelling and arthralgias.  Skin: Negative for color change and rash.  Neurological: Negative for dizziness, vertigo, syncope, light-headedness and headaches.  Hematological: Negative for adenopathy. Does not bruise/bleed easily.  All other systems reviewed and are negative.    Allergies  Other  Home Medications   Current Outpatient Rx  Name  Route  Sig  Dispense  Refill  . amLODipine (NORVASC) 10 MG tablet   Oral   Take 10 mg by mouth daily.         [name] atorvastatin (LIPITOR) 40 MG tablet   Oral   Take 40 mg by mouth at bedtime.         . Cholecalciferol (VITAMIN D3) 2000 UNITS TABS   Oral   Take 2,000 Units by mouth daily.          . hydrochlorothiazide (MICROZIDE) 12.5 MG capsule   Oral   Take 12.5 mg by mouth daily.         [name] ibuprofen (ADVIL,MOTRIN) 200 MG tablet   Oral   Take 200 mg by mouth 2 (two) times daily as needed for pain. For pain         . lisinopril (PRINIVIL,ZESTRIL) 40 MG tablet   Oral  Take 40 mg by mouth daily.         . Melatonin 5 MG TABS   Oral   Take 5 mg by mouth at bedtime as needed (for sleep).         . Multiple Vitamins-Minerals (MULTIVITAMIN WITH MINERALS) tablet   Oral   Take 1 tablet by mouth daily.         [name] omeprazole (PRILOSEC) 20 MG capsule   Oral   Take 20 mg by mouth daily.         [name] pyridOXINE (VITAMIN B-6) 100 MG tablet   Oral   Take 100 mg by mouth daily.         [name] warfarin (COUMADIN) 5 MG tablet   Oral   Take 5-7.5 mg by mouth daily. Take 7.5mg  on Monday, Wednesday, and Friday. Take 5mg  on all other days         . sertraline (ZOLOFT) 25 MG tablet   Oral   Take 1 tablet (25 mg total) by mouth daily.   30  tablet   1    BP 128/62  Pulse 64  Temp(Src) 98.4 F (36.9 C) (Oral)  Resp 20  SpO2 96% Physical Exam  Vitals reviewed. Constitutional: He is oriented to person, place, and time. He appears well-developed and well-nourished.  HENT:  Head: Normocephalic and atraumatic.  Eyes: Conjunctivae and EOM are normal.  Neck: Normal range of motion. Neck supple.  Cardiovascular: Normal rate, regular rhythm and normal heart sounds.   Pulmonary/Chest: Effort normal and breath sounds normal. No respiratory distress.  Abdominal: He exhibits no distension. There is no tenderness. There is no rebound and no guarding.  Musculoskeletal: Normal range of motion.       Cervical back: Normal.       Thoracic back: Normal.       Lumbar back: Normal.  Neurological: He is alert and oriented to person, place, and time.  Skin: Skin is warm and dry.    ED Course  Procedures (including critical care time) Labs Reviewed  CBC - Abnormal; Notable for the following:    WBC 2.8 (*)    RBC 3.71 (*)    Hemoglobin 11.1 (*)    HCT 32.8 (*)    RDW 16.0 (*)    All other components within normal limits  BASIC METABOLIC PANEL - Abnormal; Notable for the following:    Glucose, Bld 102 (*)    GFR calc non Af Amer 67 (*)    GFR calc Af Amer 78 (*)    All other components within normal limits  PROTIME-INR - Abnormal; Notable for the following:    Prothrombin Time 29.4 (*)    INR 2.91 (*)    All other components within normal limits  URINALYSIS, DIPSTICK ONLY   Dg Chest 2 View  03/14/2013   *RADIOLOGY REPORT*  Clinical Data: Short of breath.  Fall  CHEST - 2 VIEW  Comparison: 01/28/2013  Findings: Left lower lobe consolidation is unchanged and may represent atelectasis or infiltrate.  Right lung is clear. Negative for heart failure or effusion.  No pulmonary edema.  The heart size within normal limits.  IMPRESSION: Left lower lobe atelectasis/infiltrate unchanged.  No new findings.   Original Report Authenticated  By: Janeece Riggers, M.D.   Ct Head Wo Contrast  03/14/2013   *RADIOLOGY REPORT*  Clinical Data: 79 year old male status post fall with head injury. On Coumadin.  Pain.  CT HEAD WITHOUT CONTRAST  Technique:  Contiguous axial  images were obtained from the base of the skull through the vertex without contrast.  Comparison: 02/21/2013 and earlier.  Findings: Visualized paranasal sinuses and mastoids are clear. Visualized orbit soft tissues are within normal limits.  No scalp hematoma identified. Calcified atherosclerosis at the skull base.  No acute osseous abnormality identified.  Stable cerebral volume.  No ventriculomegaly. No midline shift, mass effect, or evidence of mass lesion.  Patchy confluent bilateral cerebral white matter hypodensity is stable.  Chronic- appearing right thalamic lacunar infarct.  Chronic prominent soft tissue in the sella turcica, see brain MRI 04/09/2012. No acute intracranial hemorrhage identified.  No suspicious intracranial vascular hyperdensity. No evidence of cortically based acute infarction identified.  IMPRESSION: 1.  No acute intracranial hemorrhage or acute traumatic injury the brain. 2.  Stable, with evidence of chronic small vessel disease and chronic pituitary adenoma.   Original Report Authenticated By: Erskine Speed, M.D.   1. Fall, initial encounter   2. Headache     MDM   79 y.o. male  with pertinent PMH of CAD (nonocclusive), HTN, prostate ca sp resection, multiple PE on coumadin presents with fall.  Pt feel 1 hour pta, had ha for <1 hour which is now resolved.  On arrival vitals, physical exam unremarkable, and pt without symptoms.  Mechanism of fall was mechanical slip while carrying clothes, no LOC, no chest pain, no neuro deficits or visual complaint.  Labs and imaging negative.  Given standard return precautions for head trauma on coumadin.  Also for syncope or other symtpoms.    Labs and imaging as above reviewed by myself and attending,Dr. Blinda Leatherwood, with whom  case was discussed.   1. Fall, initial encounter   2. Headache       Noel Gerold, MD 03/14/13 2316

## 2013-03-15 NOTE — ED Provider Notes (Signed)
I saw and evaluated the patient, reviewed the resident's note and I agree with the findings and plan.  As presented after a fall. Patient had a ground-level fall caused by instability, no loss of consciousness or syncope. Workup was unremarkable, patient discharged to home.  Gilda Crease, MD 03/15/13 1505

## 2013-03-18 ENCOUNTER — Other Ambulatory Visit: Payer: Self-pay | Admitting: Internal Medicine

## 2013-03-19 ENCOUNTER — Telehealth: Payer: Self-pay | Admitting: Pharmacist

## 2013-03-19 NOTE — Telephone Encounter (Signed)
Was advised by Triage Nurse Eunice Blase today that the office of Domingo Dimes DDS Drue Flirt" was caller) had called inquiring as to the possibility of interruption of warfarin prior to "deep cleaning and possibility of a 'filling' of a dental carries (tooth decay). Current recommendations of the [employer] of Chest Physicians would adjudicate this as low potential for bleeding. Given his history of VTE to include pulmonary embolism--I would advocate for minimal to NO interruption of his warfarin, i.e. Based on CHEST Recommendations and the potential for bleeding even from "deep dental cleaning" and the cavity---weighed against the potential for VTE to include fatal PE--I would NOT favor interruption of warfarin. IF the DDS is adamant regarding his belief or choice of interruption of warfarin--I would act to see that the MINIMAL time "off"/interruption would be effected, i.e., no more than 3 days of consecutive "holding" or interruption before recommencing the following day.

## 2013-03-19 NOTE — Telephone Encounter (Signed)
Message given to Dr Alexandria Lodge - and stated that he will call dentist office today 03/19/13.

## 2013-03-27 ENCOUNTER — Other Ambulatory Visit: Payer: Self-pay | Admitting: Internal Medicine

## 2013-04-11 ENCOUNTER — Encounter: Payer: Self-pay | Admitting: Internal Medicine

## 2013-04-11 ENCOUNTER — Ambulatory Visit (INDEPENDENT_AMBULATORY_CARE_PROVIDER_SITE_OTHER): Payer: Medicare Other | Admitting: Internal Medicine

## 2013-04-11 VITALS — BP 132/75 | HR 71 | Temp 96.5°F | Wt 165.8 lb

## 2013-04-11 DIAGNOSIS — F0280 Dementia in other diseases classified elsewhere without behavioral disturbance: Secondary | ICD-10-CM

## 2013-04-11 DIAGNOSIS — I10 Essential (primary) hypertension: Secondary | ICD-10-CM

## 2013-04-11 DIAGNOSIS — K5900 Constipation, unspecified: Secondary | ICD-10-CM

## 2013-04-11 DIAGNOSIS — Z7901 Long term (current) use of anticoagulants: Secondary | ICD-10-CM

## 2013-04-11 DIAGNOSIS — I2699 Other pulmonary embolism without acute cor pulmonale: Secondary | ICD-10-CM

## 2013-04-11 DIAGNOSIS — Z7902 Long term (current) use of antithrombotics/antiplatelets: Secondary | ICD-10-CM

## 2013-04-11 DIAGNOSIS — G309 Alzheimer's disease, unspecified: Secondary | ICD-10-CM

## 2013-04-11 LAB — POCT INR: INR: 4.3

## 2013-04-11 MED ORDER — WARFARIN SODIUM 5 MG PO TABS: 5.0000 mg | ORAL_TABLET | Freq: Every day | ORAL | Status: DC

## 2013-04-11 NOTE — Patient Instructions (Addendum)
General Instructions: -Please be sure to follow up with cardiology in August, and with neurology in September.  -Your blood pressure looks great today, we are not going to make any changes  -You had two medications with your that I suggest you discontinue - these include alprazolam and tramadol.  -You may continue to use melatonin as needed for sleep.  Please be sure to bring all of your medications with you to every visit.  Should you have any new or worsening symptoms, please be sure to call the clinic at [phone].   Progress Toward Treatment Goals:  Treatment Goal 04/11/2013  Blood pressure at goal  Prevent falls improved    Self Care Goals & Plans:  Self Care Goal 04/11/2013  Manage my medications take my medicines as prescribed; bring my medications to every visit  Monitor my health keep track of my blood pressure  Eat healthy foods eat foods that are low in salt; eat baked foods instead of fried foods  Be physically active take a walk every day  Prevent falls use home fall prevention checklist to improve safety  Meeting treatment goals maintain the current self-care plan       Care Management & Community Referrals:  Referral 01/25/2013  Referrals made to community resources exercise/physical therapy

## 2013-04-11 NOTE — Assessment & Plan Note (Signed)
Lifelong anticoag with coumadin for recurrent PE.  Benefit continues to outweigh risk at this time.  INR 4 today.  Have asked patient skip today's dose of 7.5mg , and take 5mg  daily for one week, and return on 04/18/13 for repeat INR.

## 2013-04-11 NOTE — Assessment & Plan Note (Signed)
Reports daily BMs.

## 2013-04-11 NOTE — Progress Notes (Signed)
Subjective:   Patient ID: [name] [name] male   DOB: 26-Aug-1931 79 y.o.   MRN: [id number]  HPI: [name] is a 79 y.o. man with h/o HLD, HTN, DJD, PE on anticoagulation, Alzheimer's dementia and CAD who presents for routine follow up.  Since last follow up, he was seen in the ED on 03/14/13 for falls.  He reports that he fell because he was holding a pile of clothes, which was obstructing his vision and so he subsequently tripped.  His son, who has accompanied him today, corroborates this story.  He has had no further falls.  He denies dizziness, at rest or when standing.  He does have follow up in August with cardiology due to his recent bradycardia.    [name] lives with his wife (who requires an AID for ADLs, I'm not sure of the details of her health, I only know that up until last year, [name] was her sole provider, but now he has help).  He also lives with his daughter.    He sleeps well, with melatonin as a sleep aid.  He has alprazolam with his medications, but his son reports that this medication has been discontinued and he is not taking it.    He has no complaints.  His daughter fills his pill box weekly and he subsequently takes his medications.  He is compliant with all medications and brings pill bottles with him today.  He is on long term coumadin, and denies s/s of overt bleeding.    For fun, [name] attends church and watches TV.   Review of Systems: Constitutional: Denies fever, chills, diaphoresis, appetite change and fatigue.  HEENT: Has had a recent cough that is improving Respiratory: Denies SOB, DOE, cough, chest tightness, and wheezing.  Cardiovascular: Denies chest pain, palpitations Gastrointestinal: Denies nausea, vomiting, abdominal pain, diarrhea, constipation,blood in stool and abdominal distention.  Genitourinary: Denies dysuria, urgency, frequency, hematuria, flank pain and difficulty urinating.  Musculoskeletal: Chronic  myalgias/arthritis, which he manages with ibuprofen Skin: Denies pallor, rash and wound.  Neurological: Denies dizziness, syncope, weakness, lightheadedness, numbness and headaches.   Past Medical History  Diagnosis Date  . PE (pulmonary embolism)     unprovoked PE completed 6 months of warfarin, warfarin d/ced 02/12/2010, repeat PE 07/10/2010 after a long car ride and now on lifelong coumadin.  [name] Hypertension   . Pituitary microadenoma     incidental finding CT 12/2009  . Prostate cancer   . Depression   . Hyperlipidemia   . Coronary artery disease, non-occlusive     with history of MI; Cath 2008 w multivessel nonobstructive CAD   Current Outpatient Prescriptions  Medication Sig Dispense Refill  . amLODipine (NORVASC) 10 MG tablet Take 10 mg by mouth daily.      [name] atorvastatin (LIPITOR) 40 MG tablet Take 40 mg by mouth at bedtime.      . Cholecalciferol (VITAMIN D3) 2000 UNITS TABS Take 2,000 Units by mouth daily.       [name] ibuprofen (ADVIL,MOTRIN) 200 MG tablet Take 200 mg by mouth 2 (two) times daily as needed for pain. For pain      . lisinopril (PRINIVIL,ZESTRIL) 40 MG tablet TAKE 1 TABLET BY MOUTH ONCE DAILY  30 tablet  2  . Melatonin 5 MG TABS Take 5 mg by mouth at bedtime as needed (for sleep).      . Multiple Vitamins-Minerals (MULTIVITAMIN WITH MINERALS) tablet Take 1 tablet by mouth daily.      [name]  omeprazole (PRILOSEC) 20 MG capsule Take 20 mg by mouth daily.      [name] pyridOXINE (VITAMIN B-6) 100 MG tablet Take 100 mg by mouth daily.      . sertraline (ZOLOFT) 25 MG tablet Take 1 tablet (25 mg total) by mouth daily.  30 tablet  1  . warfarin (COUMADIN) 5 MG tablet Take 1 tablet (5 mg total) by mouth daily.  34 tablet  1   No current facility-administered medications for this visit.   Family History  Problem Relation Age of Onset  . Hypertension Father     Passed away from cerebral hemorrhage at age of 33.  [name] Dementia Mother     Passed away  . Hypertension Child     4 adult  children  . Cervical cancer Other   . Lung cancer Other   . Stroke Other    History   Social History  . Marital Status: Married    Spouse Name: N/A    Number of Children: 4  . Years of Education: master's   Occupational History  .      retired   Social History Main Topics  . Smoking status: Never Smoker   . Smokeless tobacco: None  . Alcohol Use: No  . Drug Use: No  . Sexually Active: No   Other Topics Concern  . None   Social History Narrative   Lives with daughter. Wife also has severe dementia.     Objective:  Physical Exam: Filed Vitals:   04/11/13 1333  BP: 132/75  Pulse: 71  Temp: 96.5 F (35.8 C)  TempSrc: Oral  Weight: 165 lb 12.8 oz (75.206 kg)  SpO2: 97%   General: pleasant, appears as stated age, no distress HEENT: PERRL, EOMI, no scleral icterus Cardiac: RRR, no rubs, murmurs or gallops Pulm: clear to auscultation bilaterally, moving normal volumes of air Abd: soft, nontender, nondistended, BS normoactive Ext: warm and well perfused, b/l nonpitting edema evidenced when socks pulled down Neuro: alert and oriented X3, cranial nerves II-XII grossly intact; Mini Cog - able to repeat and recall 3 words (pen, watch, house) and good clock drawing  Assessment & Plan:   Case and care discussed with Dr. Dalphine Handing.  Please see problem oriented charting for further details. Patient to return in 6 months for routine follow up with MD, but next week for repeat INR and 1 month for Dr. Alexandria Lodge.

## 2013-04-11 NOTE — Assessment & Plan Note (Addendum)
BP Readings from Last 3 Encounters:  04/11/13 132/75  03/14/13 128/62  03/08/13 112/70    Lab Results  Component Value Date   NA 137 03/14/2013   K 3.7 03/14/2013   CREATININE 1.03 03/14/2013    Assessment: Blood pressure control: controlled Progress toward BP goal:  at goal  Plan: Medications:  continue current medications - which includes lisinopril 40 & amlodipine 10; chart review reveals patient has history of nonobstructive CAD but patient is not on BB with recent history of bradycardia (thought to be secondary to aricept).  HR normal in the office today, but patient has cardiology follow up next month. Educational resources provided: brochure Self management tools provided: home blood pressure logbook

## 2013-04-11 NOTE — Assessment & Plan Note (Signed)
Good MiniCog today.  Plan to follow with Dr. Pearlean Brownie in September.  Aricept held at this time due to bradycardia.

## 2013-04-12 NOTE — Progress Notes (Signed)
Case discussed with Dr. Sharda at the time of the visit.  We reviewed the resident's history and exam and pertinent patient test results.  I agree with the assessment, diagnosis, and plan of care documented in the resident's note.    

## 2013-04-22 ENCOUNTER — Other Ambulatory Visit (HOSPITAL_COMMUNITY): Payer: Self-pay | Admitting: Internal Medicine

## 2013-04-25 NOTE — Telephone Encounter (Signed)
Zoloft rx called to [facility] pharmacy. 

## 2013-04-27 ENCOUNTER — Institutional Professional Consult (permissible substitution): Payer: Medicare Other | Admitting: Cardiology

## 2013-05-11 ENCOUNTER — Encounter: Payer: Self-pay | Admitting: Cardiology

## 2013-05-15 ENCOUNTER — Ambulatory Visit: Payer: Self-pay | Admitting: Neurology

## 2013-05-23 ENCOUNTER — Other Ambulatory Visit: Payer: Self-pay | Admitting: Internal Medicine

## 2013-05-24 ENCOUNTER — Ambulatory Visit (INDEPENDENT_AMBULATORY_CARE_PROVIDER_SITE_OTHER): Payer: Medicare Other | Admitting: Nurse Practitioner

## 2013-05-24 ENCOUNTER — Encounter: Payer: Self-pay | Admitting: Nurse Practitioner

## 2013-05-24 VITALS — BP 118/74 | HR 69 | Temp 98.2°F | Ht 66.4 in | Wt 164.0 lb

## 2013-05-24 DIAGNOSIS — R259 Unspecified abnormal involuntary movements: Secondary | ICD-10-CM

## 2013-05-24 DIAGNOSIS — I6789 Other cerebrovascular disease: Secondary | ICD-10-CM

## 2013-05-24 DIAGNOSIS — F0280 Dementia in other diseases classified elsewhere without behavioral disturbance: Secondary | ICD-10-CM

## 2013-05-24 DIAGNOSIS — R29818 Other symptoms and signs involving the nervous system: Secondary | ICD-10-CM

## 2013-05-24 DIAGNOSIS — R269 Unspecified abnormalities of gait and mobility: Secondary | ICD-10-CM

## 2013-05-24 MED ORDER — CARBIDOPA-LEVODOPA 25-100 MG PO TABS: 1.0000 | ORAL_TABLET | Freq: Three times a day (TID) | ORAL | Status: DC

## 2013-05-24 NOTE — Progress Notes (Signed)
[facility]  [name] DOB: Jul 14, 1931   HISTORY FROM: patient and daughter REASON FOR VISIT: follow up dementia   HISTORICAL  CHIEF COMPLAINT:  Chief Complaint  Patient presents with  . Follow-up    Medication reaction #14    HISTORY OF PRESENT ILLNESS: UPDATE 05/24/13 (LL): Patient returns for followup. After starting Aricept at last visit daughter reports that he became very weak, confused, and had to be hospitalized. Was found to be bradycardic and symptomatic with heart rate in the 40s, attributable to the Aricept.  Medication was stopped and patient was discharged to rehabilitation for short time and has since returned home.  Daughter states that he has adjusted somewhat to having moved in with her, is taking Zoloft for depression, and has disinterest in getting out of the house much. States that sometimes it is hard for him to pick up his foot to take a step and it seems to be when he is nervous. Also seems to have tremor when nervous. Has had trouble with insomnia was started on melatonin 5 mg at bedtime with no response. Daughter feels like he has improved some in his cognitive ability since last visit. MMSE last visit was 23/30. MMSE today is 25/30 with deficits in recall. Clock drawing 0/4. AFT 10. Geriatric depression score is 3 does not suggest depression.  PRIOR HPI 02/05/13 (PS): 58 male with remote right hemispheric TIA in May 2011 and mild cognitive impairment.  He returns today for followup after his last visit with me on 01/05/2012. Continues to do well from a neurovascular standpoint and has not had a any further stroke or TIAs. He remains on warfarin and is tolerating it well without significant bleeding, bruising or the side effects. He states his blood pressure is under good control and it is 124/76 in office today. He however is had increasing confusion and memory difficulties for the last 1 year which have been progressive. He now  needs help with taking his medications. He has stopped going out. He in fact wandered out a few weeks ago from his home and needed help to get back. He denies any agitation or caval changes. He has no significant hallucination or other lesions. His gait and balance are fine and there have been no safety issues. He has now moved and to live with his daughter for the last 2-3 months. The patient did undergo labs for treatable causes of cognitive impairment at last visit and vitamin B12, TSH and RPR were normal on 01/05/2012. His mother did have Alzheimer's.  REVIEW OF SYSTEMS: Full 14 system review of systems performed and notable only for:  Constitutional: N/A  Cardiovascular: N/A  Ear/Nose/Throat: N/A  Skin: N/A  Eyes: N/A  Respiratory: N/A  Gastroitestinal: Incontinence  Hematology/Lymphatic: N/A  Endocrine: N/A Musculoskeletal: joint pain, aching muscles  Allergy/Immunology: N/A  Neurological: Weakness Psychiatric: Anxiety, non-of sleep, decreased energy Sleep: Insomnia   ALLERGIES: Allergies  Allergen Reactions  . Other Other (See Comments)    Shrimp gives him gout    HOME MEDICATIONS: Outpatient Prescriptions Prior to Visit  Medication Sig Dispense Refill  . amLODipine (NORVASC) 10 MG tablet Take 10 mg by mouth daily.      [name] atorvastatin (LIPITOR) 40 MG tablet Take 40 mg by mouth at bedtime.      . Cholecalciferol (VITAMIN D3) 2000 UNITS TABS Take 2,000 Units by mouth daily.       [name] ibuprofen (ADVIL,MOTRIN) 200 MG tablet Take 200 mg by mouth  2 (two) times daily as needed for pain. For pain      . lisinopril (PRINIVIL,ZESTRIL) 40 MG tablet TAKE 1 TABLET BY MOUTH ONCE DAILY  30 tablet  2  . Melatonin 5 MG TABS Take 5 mg by mouth at bedtime as needed (for sleep).      . Multiple Vitamins-Minerals (MULTIVITAMIN WITH MINERALS) tablet Take 1 tablet by mouth daily.      [name] omeprazole (PRILOSEC) 20 MG capsule Take 20 mg by mouth daily.      . sertraline (ZOLOFT) 25 MG tablet TAKE 1  TABLET (25 MG TOTAL) BY MOUTH DAILY.  30 tablet  1  . warfarin (COUMADIN) 5 MG tablet Take 1 tablet (5 mg total) by mouth daily.  34 tablet  1  . pyridOXINE (VITAMIN B-6) 100 MG tablet Take 100 mg by mouth daily.       No facility-administered medications prior to visit.    PAST MEDICAL HISTORY: Past Medical History  Diagnosis Date  . PE (pulmonary embolism)     unprovoked PE completed 6 months of warfarin, warfarin d/ced 02/12/2010, repeat PE 07/10/2010 after a long car ride and now on lifelong coumadin.  [name] Hypertension   . Pituitary microadenoma     incidental finding CT 12/2009  . Prostate cancer   . Depression   . Hyperlipidemia   . Coronary artery disease, non-occlusive     with history of MI; Cath 2008 w multivessel nonobstructive CAD    PAST SURGICAL HISTORY: Past Surgical History  Procedure Laterality Date  . Prostatectomy      FAMILY HISTORY: Family History  Problem Relation Age of Onset  . Hypertension Father     Passed away from cerebral hemorrhage at age of 77.  [name] Dementia Mother     Passed away  . Hypertension Child     4 adult children  . Cervical cancer Other   . Lung cancer Other   . Stroke Other     SOCIAL HISTORY: History   Social History  . Marital Status: Married    Spouse Name: N/A    Number of Children: 4  . Years of Education: master's   Occupational History  .      retired   Social History Main Topics  . Smoking status: Never Smoker   . Smokeless tobacco: Not on file  . Alcohol Use: No  . Drug Use: No  . Sexual Activity: No   Other Topics Concern  . Not on file   Social History Narrative   Lives with daughter. Wife also has severe dementia.      PHYSICAL EXAM  Filed Vitals:   05/24/13 1356  BP: 118/74  Pulse: 69  Temp: 98.2 F (36.8 C)  TempSrc: Oral  Height: 5' 6.4" (1.687 m)  Weight: 164 lb (74.39 kg)   Body mass index is 26.14 kg/(m^2).  Generalized: In no acute distress, pleasant elderly AA male, well  groomed, well developed   Neck: Supple, no carotid bruits   Cardiac: Irregular rate and rhythm, soft 1/6 murmur   Pulmonary: Clear to auscultation bilaterally   Musculoskeletal: mild kyphosis   Neurological examination   Mentation: Alert, language fluent. MMSE 25/30 deficit and date, recall 1/3, comprehension, drawing. Clock drawing 0/4. AFT 10.  Geriatric depression score 3. Mood and affect appropriate. MASKED FACIES.  Cranial nerve II-XII: Pupils were equal round reactive to light extraocular movements were full, visual field were full on confrontational test. facial sensation and strength were normal. hearing  was intact to finger rubbing bilaterally. Uvula tongue midline. head turning and shoulder shrug and were normal and symmetric.Tongue protrusion into cheek strength was normal. MOTOR: normal bulk and tone, full strength in the BUE, BLE, fine finger movements normal, no pronator drift. MILD COGWHEEL RIGIDIITY IN ARMS AND WRISTS, R>L. SENSORY: normal and symmetric to light touch, pinprick COORDINATION: finger-nose-finger, heel-to-shin bilaterally, there was no truncal ataxia REFLEXES: 1+ and symmetric. POSITIVE MYERSON'S, POSITIVE SNOUT, NEGATIVE PALMOMENTAL. GAIT/STATION: Rising up from seated position without assistance, STOOPED STANCE, without trunk ataxia, moderate stride, POOR ARM SWING ON RIGHT, TURNS IN 5 STEPS, unable to perform tiptoe, and heel walking without difficulty. Romberg negative.  DIAGNOSTIC DATA (LABS, IMAGING, TESTING) - I reviewed patient records, labs, notes, testing and imaging myself where available.  Lab Results  Component Value Date   WBC 2.8* 03/14/2013   HGB 11.1* 03/14/2013   HCT 32.8* 03/14/2013   MCV 88.4 03/14/2013   PLT 166 03/14/2013      Component Value Date/Time   NA 137 03/14/2013 1818   NA 140 11/12/2011   K 3.7 03/14/2013 1818   CL 104 03/14/2013 1818   CO2 24 03/14/2013 1818   GLUCOSE 102* 03/14/2013 1818   BUN 18 03/14/2013 1818   BUN 17  11/12/2011   CREATININE 1.03 03/14/2013 1818   CREATININE 1.19 03/08/2013 1146   CREATININE 1.1 11/12/2011   CALCIUM 8.6 03/14/2013 1818   PROT 6.4 02/20/2013 1955   ALBUMIN 3.2* 02/20/2013 1955   AST 35 02/20/2013 1955   ALT 28 02/20/2013 1955   ALKPHOS 56 02/20/2013 1955   BILITOT 0.3 02/20/2013 1955   GFRNONAA 67* 03/14/2013 1818   GFRAA 78* 03/14/2013 1818   Lab Results  Component Value Date   CHOL 207* 06/30/2012   HDL 56 06/30/2012   LDLCALC 125* 06/30/2012   TRIG 129 06/30/2012   CHOLHDL 3.7 06/30/2012   Lab Results  Component Value Date   HGBA1C  Value: 6.6 (NOTE) The ADA recommends the following therapeutic goal for glycemic control related to Hgb A1c measurement: Goal of therapy: <6.5 Hgb A1c  Reference: American Diabetes Association: Clinical Practice Recommendations 2010, Diabetes Care, 2010, 33: (Suppl  1).* 09/08/2009   Lab Results  Component Value Date   VITAMINB12 429 01/19/2013   Lab Results  Component Value Date   TSH 0.739 02/20/2013    ASSESSMENT AND PLAN 81 patient with mild cognitive impairment which appears to progressed to mild dementia. Likely Alzheimer`s type.  Remote history of right brain TIA in May 2011 and stable from a neurovascular standpoint. Aricept caused bradycardia.    PLAN:  I had a long discussion with the patient and family regarding his progressive memory decline and discussed treatment options. Will trial Sinemet for benefit of Parkinsonianism, if tolerated, may add Namenda at next visit.  Continue warfarin for stroke prevention admitted strict control of hypertension with blood pressure goal below 130/90. Start fish oil 2 capsules daily. Increase participation and cognitively challenging activities. return for followup in 2 months with Heide Guile, nurse practitioner   Meds ordered this encounter  Medications  . carbidopa-levodopa (SINEMET) 25-100 MG per tablet    Sig: Take 1 tablet by mouth 3 (three) times daily. Start 1/2 tablet 30 minutes before  meals, after 1 week increase to whole tablet.    Dispense:  90 tablet    Refill:  2    Order Specific Question:  Supervising Provider    Answer:  Pearlean Brownie, PRAMOD S [[id number]]   Tem[place] Somma  Dominic Pea, MSN, NP-C [facility] [address] [place], [place] [address] [phone]

## 2013-05-24 NOTE — Patient Instructions (Addendum)
Start Sinemet 25-100mg . Take 1/2 tablet 30 minutes before meals, three times a day. After 1 week, increase to 1 whole tablet three times a day 30 minutes before meals.  Side effects to report are severe nausea, vomiting, diarrhea, or hallucinations.  Please stop medication and call our office.  Follow up in 2 months.

## 2013-05-25 ENCOUNTER — Other Ambulatory Visit: Payer: Self-pay | Admitting: Gastroenterology

## 2013-05-25 DIAGNOSIS — R634 Abnormal weight loss: Secondary | ICD-10-CM

## 2013-05-25 DIAGNOSIS — R1310 Dysphagia, unspecified: Secondary | ICD-10-CM

## 2013-05-28 ENCOUNTER — Ambulatory Visit: Payer: Medicare Other | Admitting: Neurology

## 2013-05-28 ENCOUNTER — Other Ambulatory Visit: Payer: Self-pay | Admitting: Gastroenterology

## 2013-05-28 DIAGNOSIS — R634 Abnormal weight loss: Secondary | ICD-10-CM

## 2013-05-28 DIAGNOSIS — R1033 Periumbilical pain: Secondary | ICD-10-CM

## 2013-05-30 ENCOUNTER — Inpatient Hospital Stay
Admission: RE | Admit: 2013-05-30 | Discharge: 2013-05-30 | Disposition: A | Discharge status: Home / Self Care | Payer: Medicare Other | Source: Ambulatory Visit | Attending: Gastroenterology | Admitting: Gastroenterology

## 2013-06-01 ENCOUNTER — Ambulatory Visit
Admission: RE | Admit: 2013-06-01 | Discharge: 2013-06-01 | Disposition: A | Discharge status: Home / Self Care | Payer: Medicare Other | Source: Ambulatory Visit | Attending: Gastroenterology | Admitting: Gastroenterology

## 2013-06-01 DIAGNOSIS — R634 Abnormal weight loss: Secondary | ICD-10-CM

## 2013-06-01 DIAGNOSIS — R1033 Periumbilical pain: Secondary | ICD-10-CM

## 2013-06-01 MED ORDER — IOHEXOL 300 MG/ML  SOLN: 100.0000 mL | Freq: Once | INTRAMUSCULAR | Status: AC | PRN

## 2013-06-11 ENCOUNTER — Ambulatory Visit (INDEPENDENT_AMBULATORY_CARE_PROVIDER_SITE_OTHER): Payer: Medicare Other | Admitting: Pharmacist

## 2013-06-11 ENCOUNTER — Other Ambulatory Visit: Payer: Self-pay | Admitting: Internal Medicine

## 2013-06-11 DIAGNOSIS — Z7902 Long term (current) use of antithrombotics/antiplatelets: Secondary | ICD-10-CM

## 2013-06-11 DIAGNOSIS — Z86711 Personal history of pulmonary embolism: Secondary | ICD-10-CM

## 2013-06-11 DIAGNOSIS — Z7901 Long term (current) use of anticoagulants: Secondary | ICD-10-CM

## 2013-06-11 DIAGNOSIS — I2699 Other pulmonary embolism without acute cor pulmonale: Secondary | ICD-10-CM

## 2013-06-11 LAB — POCT INR: INR: 2.3

## 2013-06-11 NOTE — Patient Instructions (Signed)
Patient instructed to take medications as defined in the Anti-coagulation Track section of this encounter.  Patient instructed to tke today's dose.  Patient verbalized understanding of these instructions.    

## 2013-06-11 NOTE — Progress Notes (Signed)
Anti-Coagulation Progress Note  [name] [name] is a 79 y.o. male who is currently on an anti-coagulation regimen.    RECENT RESULTS: Recent results are below, the most recent result is correlated with a dose of 35 mg. per week: Lab Results  Component Value Date   INR 2.30 06/11/2013   INR 4.3 04/11/2013   INR 2.91* 03/14/2013    ANTI-COAG DOSE: Anticoagulation Dose Instructions as of 06/11/2013     Glynis Smiles Tue Wed Thu Fri Sat   New Dose 5 mg 5 mg 5 mg 5 mg 5 mg 5 mg 5 mg       ANTICOAG SUMMARY: Anticoagulation Episode Summary   Current INR goal 2.0-3.0  Next INR check 07/09/2013  INR from last check 2.30 (06/11/2013)  Weekly max dose   Target end date Indefinite  [facility]R check location [facility]  Preferred lab   Send INR reminders to ANTICOAG IMP   Indications  Pulmonary embolism [415.19] Encounter for long-term use of antiplatelets/antithrombotics [V58.63]        Comments History of multiple venous embolic episodes. Will continue to annual re-evaluate continued need for warfarin weighing risks vs. benefits.       Anticoagulation Care Providers   Provider Role Specialty Phone number   Farley Ly, MD  Internal Medicine [phone]      ANTICOAG TODAY: Anticoagulation Summary as of 06/11/2013   INR goal 2.0-3.0  Selected INR 2.30 (06/11/2013)  Next INR check 07/09/2013  Target end date Indefinite   Indications  Pulmonary embolism [415.19] Encounter for long-term use of antiplatelets/antithrombotics [V58.63]      Anticoagulation Episode Summary   [facility]R check location [facility]   Preferred lab    Send INR reminders to ANTICOAG [facility]   Comments History of multiple venous embolic episodes. Will continue to annual re-evaluate continued need for warfarin weighing risks vs. benefits.     Anticoagulation Care Providers   Provider Role Specialty Phone number   Farley Ly, MD  Internal Medicine [phone]      PATIENT INSTRUCTIONS: Patient  Instructions  Patient instructed to take medications as defined in the Anti-coagulation Track section of this encounter.  Patient instructed to tke today's dose.  Patient verbalized understanding of these instructions.       FOLLOW-UP Return in 4 weeks (on 07/09/2013) for Follow up INR at 4:30PM.  [name], [name] Pharm.D., CACP

## 2013-06-22 ENCOUNTER — Other Ambulatory Visit: Payer: Self-pay | Admitting: Internal Medicine

## 2013-06-23 ENCOUNTER — Other Ambulatory Visit: Payer: Self-pay | Admitting: Internal Medicine

## 2013-07-09 ENCOUNTER — Ambulatory Visit (INDEPENDENT_AMBULATORY_CARE_PROVIDER_SITE_OTHER): Payer: Medicare Other | Admitting: Pharmacist

## 2013-07-09 ENCOUNTER — Ambulatory Visit (HOSPITAL_COMMUNITY): Payer: Medicare Other | Attending: Cardiology

## 2013-07-09 ENCOUNTER — Encounter: Payer: Self-pay | Admitting: Cardiology

## 2013-07-09 ENCOUNTER — Ambulatory Visit (INDEPENDENT_AMBULATORY_CARE_PROVIDER_SITE_OTHER): Payer: Medicare Other | Admitting: Cardiology

## 2013-07-09 VITALS — BP 98/56 | HR 95 | Ht 68.0 in | Wt 162.1 lb

## 2013-07-09 DIAGNOSIS — R9431 Abnormal electrocardiogram [ECG] [EKG]: Secondary | ICD-10-CM | POA: Insufficient documentation

## 2013-07-09 DIAGNOSIS — Z7901 Long term (current) use of anticoagulants: Secondary | ICD-10-CM

## 2013-07-09 DIAGNOSIS — Z86711 Personal history of pulmonary embolism: Secondary | ICD-10-CM | POA: Insufficient documentation

## 2013-07-09 DIAGNOSIS — I10 Essential (primary) hypertension: Secondary | ICD-10-CM | POA: Insufficient documentation

## 2013-07-09 DIAGNOSIS — I495 Sick sinus syndrome: Secondary | ICD-10-CM

## 2013-07-09 DIAGNOSIS — Z7902 Long term (current) use of antithrombotics/antiplatelets: Secondary | ICD-10-CM

## 2013-07-09 DIAGNOSIS — I4510 Unspecified right bundle-branch block: Secondary | ICD-10-CM | POA: Insufficient documentation

## 2013-07-09 DIAGNOSIS — R079 Chest pain, unspecified: Secondary | ICD-10-CM | POA: Insufficient documentation

## 2013-07-09 DIAGNOSIS — F0390 Unspecified dementia without behavioral disturbance: Secondary | ICD-10-CM | POA: Insufficient documentation

## 2013-07-09 DIAGNOSIS — I498 Other specified cardiac arrhythmias: Secondary | ICD-10-CM | POA: Insufficient documentation

## 2013-07-09 DIAGNOSIS — R001 Bradycardia, unspecified: Secondary | ICD-10-CM

## 2013-07-09 DIAGNOSIS — I2699 Other pulmonary embolism without acute cor pulmonale: Secondary | ICD-10-CM

## 2013-07-09 DIAGNOSIS — I2510 Atherosclerotic heart disease of native coronary artery without angina pectoris: Secondary | ICD-10-CM | POA: Insufficient documentation

## 2013-07-09 NOTE — Progress Notes (Signed)
Anti-Coagulation Progress Note  [name] [name] is a 79 y.o. male who is currently on an anti-coagulation regimen.    RECENT RESULTS: Recent results are below, the most recent result is correlated with a dose of 35 mg. per week: Lab Results  Component Value Date   INR 2.10 07/09/2013   INR 2.30 06/11/2013   INR 4.3 04/11/2013    ANTI-COAG DOSE: Anticoagulation Dose Instructions as of 07/09/2013     Glynis Smiles Tue Wed Thu Fri Sat   New Dose 5 mg 7.5 mg 5 mg 5 mg 5 mg 5 mg 5 mg       ANTICOAG SUMMARY: Anticoagulation Episode Summary   Current INR goal 2.0-3.0  Next INR check 08/06/2013  INR from last check 2.10 (07/09/2013)  Weekly max dose   Target end date Indefinite  [facility] check location [facility]  Preferred lab   Send INR reminders to ANTICOAG [facility]   Indications  Pulmonary embolism [415.19] Encounter for long-term use of antiplatelets/antithrombotics [V58.63]        Comments History of multiple venous embolic episodes. Will continue to annual re-evaluate continued need for warfarin weighing risks vs. benefits.       Anticoagulation Care Providers   Provider Role Specialty Phone number   Farley Ly, MD  Internal Medicine [phone]      ANTICOAG TODAY: Anticoagulation Summary as of 07/09/2013   INR goal 2.0-3.0  Selected INR 2.10 (07/09/2013)  Next INR check 08/06/2013  Target end date Indefinite   Indications  Pulmonary embolism [415.19] Encounter for long-term use of antiplatelets/antithrombotics [V58.63]      Anticoagulation Episode Summary   [facility] check location [facility]   Preferred lab    Send INR reminders to [facility]AG [facility]   Comments History of multiple venous embolic episodes. Will continue to annual re-evaluate continued need for warfarin weighing risks vs. benefits.     Anticoagulation Care Providers   Provider Role Specialty Phone number   Farley Ly, MD  Internal Medicine [phone]      PATIENT INSTRUCTIONS: Patient  Instructions  Patient instructed to take medications as defined in the Anti-coagulation Track section of this encounter.  Patient instructed to take today's dose.  Patient verbalized understanding of these instructions.       FOLLOW-UP Return in about 4 weeks (around 08/06/2013) for Follow up INR at 3:30PM.  [name], [name] Pharm.D., CACP

## 2013-07-09 NOTE — Assessment & Plan Note (Addendum)
Patient apparently was noted to have bradycardia during his recent hospitalization. His heart rate was in the 40s and 50s by his daughter's report. I have reviewed his electrocardiograms. His lowest heart rate was 48 and he does have conduction disease with a right bundle branch block. However he is not having symptoms of presyncope or syncope. I therefore doubt is symptomatic from his bradycardia and there is no documentation of high degree AV block. I will schedule an echocardiogram to assess LV function. If normal I do not think further cardiac workup is indicated and no pacemaker indicated at this time. I have recommended that he avoid AV nodal blocking agents in the future.

## 2013-07-09 NOTE — Patient Instructions (Signed)
Patient instructed to take medications as defined in the Anti-coagulation Track section of this encounter.  Patient instructed to take today's dose.  Patient verbalized understanding of these instructions.    

## 2013-07-09 NOTE — Progress Notes (Signed)
Echocardiogram performed.  

## 2013-07-09 NOTE — Patient Instructions (Signed)
Your physician recommends that you schedule a follow-up appointment in: AS NEEDED PENDING TEST RESULTS  Your physician has requested that you have an echocardiogram. Echocardiography is a painless test that uses sound waves to create images of your heart. It provides your doctor with information about the size and shape of your heart and how well your heart's chambers and valves are working. This procedure takes approximately one hour. There are no restrictions for this procedure.    

## 2013-07-09 NOTE — Progress Notes (Signed)
HPI: 79 year old male for evaluation of bradycardia. Cardiac catheterization in 2008 showed a 25% left main, 25% proximal and 50% mid LAD. There is a 50% first diagonal. The PDA had a 30% lesion. Ejection fraction 65%. Echocardiogram in April of 2011 showed normal LV function and grade 1 diastolic dysfunction. TSH in June of 2014 0.739. Patient apparently noted to be bradycardic on admission in June of 2014. Aricept was discontinued. In reviewing electrocardiograms from June admission there is one electrocardiogram from the [facility] that shows a heart rate of 48 with a right bundle branch block. Other electrocardiograms show heart rates in the 50s. Patient has dyspnea with more extreme activities but not routine activities. He occasionally feels chest pain when he is "tired". He does not have exertional chest pain. He does not have syncope at present or a history of presyncope. There was a question of a syncopal episode approximately 9 years ago when he fell off of his roof. No documented rhythm disturbance.  Current Outpatient Prescriptions  Medication Sig Dispense Refill  . amLODipine (NORVASC) 10 MG tablet Take 10 mg by mouth daily.      [name] atorvastatin (LIPITOR) 40 MG tablet TAKE 1 TABLET BY MOUTH DAILY  30 tablet  3  . b complex vitamins tablet Take 1 tablet by mouth daily.      . Calcium Carbonate-Vitamin D (CALCIUM-VITAMIN D) 500-200 MG-UNIT per tablet Take 1 tablet by mouth daily.      . carbidopa-levodopa (SINEMET) 25-100 MG per tablet Take 1 tablet by mouth 3 (three) times daily. Start 1/2 tablet 30 minutes before meals, after 1 week increase to whole tablet.  90 tablet  2  . Cholecalciferol (VITAMIN D3) 2000 UNITS TABS Take 2,000 Units by mouth daily.       . fish oil-omega-3 fatty acids 1000 MG capsule Take 2 g by mouth daily. 2 tabs daily      . ibuprofen (ADVIL,MOTRIN) 200 MG tablet Take 200 mg by mouth 2 (two) times daily as needed for pain. For pain      . lisinopril  (PRINIVIL,ZESTRIL) 40 MG tablet TAKE 1 TABLET BY MOUTH DAILY  30 tablet  2  . Multiple Vitamins-Minerals (MULTIVITAMIN WITH MINERALS) tablet Take 1 tablet by mouth daily.      [name] omeprazole (PRILOSEC) 20 MG capsule Take 20 mg by mouth daily.      . sertraline (ZOLOFT) 25 MG tablet TAKE 1 TABLET (25 MG TOTAL) BY MOUTH DAILY.  30 tablet  1  . warfarin (COUMADIN) 5 MG tablet TAKE 1 AND 1/2 TABLET BY MOUTH ON MON WED AND FRI WHILE TAKING 1 ON OTHER DAYS PER CLINIC  34 tablet  1   No current facility-administered medications for this visit.    Allergies  Allergen Reactions  . Other Other (See Comments)    Shrimp gives him gout    Past Medical History  Diagnosis Date  . PE (pulmonary embolism)     unprovoked PE completed 6 months of warfarin, warfarin d/ced 02/12/2010, repeat PE 07/10/2010 after a long car ride and now on lifelong coumadin.  [name] Hypertension   . Pituitary microadenoma     incidental finding CT 12/2009  . Prostate cancer   . Depression   . Hyperlipidemia   . Coronary artery disease, non-occlusive     with history of MI; Cath 2008 w multivessel nonobstructive CAD  . Dementia     Past Surgical History  Procedure Laterality Date  . Prostatectomy  History   Social History  . Marital Status: Married    Spouse Name: N/A    Number of Children: 4  . Years of Education: master's   Occupational History  .      retired   Social History Main Topics  . Smoking status: Never Smoker   . Smokeless tobacco: Not on file  . Alcohol Use: No  . Drug Use: No  . Sexual Activity: No   Other Topics Concern  . Not on file   Social History Narrative   Lives with daughter. Wife also has severe dementia.     Family History  Problem Relation Age of Onset  . Hypertension Father     Passed away from cerebral hemorrhage at age of 46.  [name] Dementia Mother     Passed away  . Hypertension Child     4 adult children  . Cervical cancer Other   . Lung cancer Other   . Stroke  Other     ROS: arthritis but no fevers or chills, productive cough, hemoptysis, dysphasia, odynophagia, melena, hematochezia, dysuria, hematuria, rash, seizure activity, orthopnea, PND, pedal edema, claudication. Remaining systems are negative.  Physical Exam:   Blood pressure 98/56, pulse 95, height 5\' 8"  (1.727 m), weight 162 lb 1.9 oz (73.537 kg).  General:  Well developed/well nourished in NAD Skin warm/dry Patient not depressed No peripheral clubbing Back-normal HEENT-normal/normal eyelids Neck supple/normal carotid upstroke bilaterally; no bruits; no JVD; no thyromegaly chest - CTA/ normal expansion CV - RRR/normal S1 and S2; no rubs or gallops;  PMI nondisplaced, 1/6 systolic murmur left sternal border. Abdomen -NT/ND, no HSM, no mass, + bowel sounds, no bruit 2+ femoral pulses, no bruits Ext-no edema, chords, 2+ DP Neuro-grossly nonfocal  ECG sinus rhythm at a rate of 67. Occasional PVC. Right bundle branch block. Left axis deviation.

## 2013-07-09 NOTE — Assessment & Plan Note (Signed)
Blood pressure is borderline. His daughter follows this at home and systolic typically ranges in the 150 range. Continue to monitor and decrease medications as needed.

## 2013-07-10 NOTE — Progress Notes (Signed)
Indication: Recurrent venous thromboembolism Duration: Lifelong INR: At target. Dr. Groce's assessment and plan were reviewed and I agree with his documentation. 

## 2013-07-24 ENCOUNTER — Encounter: Payer: Self-pay | Admitting: Nurse Practitioner

## 2013-07-24 ENCOUNTER — Ambulatory Visit (INDEPENDENT_AMBULATORY_CARE_PROVIDER_SITE_OTHER): Payer: Medicare Other | Admitting: Nurse Practitioner

## 2013-07-24 VITALS — BP 131/80 | HR 65 | Temp 97.5°F | Ht 66.4 in | Wt 162.0 lb

## 2013-07-24 DIAGNOSIS — G20A1 Parkinson's disease without dyskinesia, without mention of fluctuations: Secondary | ICD-10-CM

## 2013-07-24 DIAGNOSIS — G20 Parkinson's disease: Secondary | ICD-10-CM

## 2013-07-24 DIAGNOSIS — F0280 Dementia in other diseases classified elsewhere without behavioral disturbance: Secondary | ICD-10-CM

## 2013-07-24 DIAGNOSIS — R269 Unspecified abnormalities of gait and mobility: Secondary | ICD-10-CM

## 2013-07-24 MED ORDER — CARBIDOPA-LEVODOPA 25-100 MG PO TABS: 1.0000 | ORAL_TABLET | Freq: Three times a day (TID) | ORAL | Status: DC

## 2013-07-24 MED ORDER — NAMENDA XR TITRATION PACK 7 & 14 & 21 &28 MG PO CP24: 1.0000 | ORAL_CAPSULE | Freq: Every day | ORAL | Status: DC

## 2013-07-24 NOTE — Progress Notes (Signed)
[facility]  [name] DOB: [birth year]   REASON FOR VISIT: follow up HISTORY FROM: patient  HISTORY OF PRESENT ILLNESS: UPDATE 06/23/13 (LL):  Mr. Hillman comes back for 2 month revisit.  Last visit he was started on Sinemet to see if he would have any benefit.  Both he and his daughter think that he is moving better since on it.  Daughter states that he is no longer "freezing" when he tries to take a step.  He is currently taking it twice a day, once when he gets up (around 7-8) and then again at 5 pm.  He has no new complaints.  MMSE today is 27/30, AFT 8 and Clock drawing 1/4.  UPDATE 05/24/13 (LL): Patient returns for followup. After starting Aricept at last visit daughter reports that he became very weak, confused, and had to be hospitalized. Was found to be bradycardic and symptomatic with heart rate in the 40s, attributable to the Aricept. Medication was stopped and patient was discharged to rehabilitation for short time and has since returned home. Daughter states that he has adjusted somewhat to having moved in with her, is taking Zoloft for depression, and has disinterest in getting out of the house much. States that sometimes it is hard for him to pick up his foot to take a step and it seems to be when he is nervous. Also seems to have tremor when nervous. Has had trouble with insomnia was started on melatonin 5 mg at bedtime with no response. Daughter feels like he has improved some in his cognitive ability since last visit. MMSE last visit was 23/30. MMSE today is 25/30 with deficits in recall. Clock drawing 0/4. AFT 10. Geriatric depression score is 3 does not suggest depression.   PRIOR HPI 02/05/13 (PS): 30 male with remote right hemispheric TIA in May 2011 and mild cognitive impairment. He returns today for followup after his last visit with me on 01/05/2012. Continues to do well from a neurovascular standpoint and has not had a any further stroke  or TIAs. He remains on warfarin and is tolerating it well without significant bleeding, bruising or the side effects. He states his blood pressure is under good control and it is 124/76 in office today. He however is had increasing confusion and memory difficulties for the last 1 year which have been progressive. He now needs help with taking his medications. He has stopped going out. He in fact wandered out a few weeks ago from his home and needed help to get back. He denies any agitation or caval changes. He has no significant hallucination or other lesions. His gait and balance are fine and there have been no safety issues. He has now moved and to live with his daughter for the last 2-3 months. The patient did undergo labs for treatable causes of cognitive impairment at last visit and vitamin B12, TSH and RPR were normal on 01/05/2012. His mother did have Alzheimer's.   REVIEW OF SYSTEMS: Full 14 system review of systems performed and notable only for: (nothing).  ALLERGIES: Allergies  Allergen Reactions  . Aricept [Donepezil Hcl] Other (See Comments)    Symptomatic Bradycardia.  . Other Other (See Comments)    Shrimp gives him gout    HOME MEDICATIONS: Outpatient Prescriptions Prior to Visit  Medication Sig Dispense Refill  . amLODipine (NORVASC) 10 MG tablet Take 10 mg by mouth daily.      [name] atorvastatin (LIPITOR) 40 MG tablet TAKE 1 TABLET BY  MOUTH DAILY  30 tablet  3  . b complex vitamins tablet Take 1 tablet by mouth daily.      . Calcium Carbonate-Vitamin D (CALCIUM-VITAMIN D) 500-200 MG-UNIT per tablet Take 1 tablet by mouth daily.      . Cholecalciferol (VITAMIN D3) 2000 UNITS TABS Take 2,000 Units by mouth daily.       . fish oil-omega-3 fatty acids 1000 MG capsule Take 2 g by mouth daily. 2 tabs daily      . ibuprofen (ADVIL,MOTRIN) 200 MG tablet Take 200 mg by mouth 2 (two) times daily as needed for pain. For pain      . lisinopril (PRINIVIL,ZESTRIL) 40 MG tablet TAKE 1 TABLET BY  MOUTH DAILY  30 tablet  2  . Multiple Vitamins-Minerals (MULTIVITAMIN WITH MINERALS) tablet Take 1 tablet by mouth daily.      [name] omeprazole (PRILOSEC) 20 MG capsule Take 20 mg by mouth daily.      . sertraline (ZOLOFT) 25 MG tablet TAKE 1 TABLET (25 MG TOTAL) BY MOUTH DAILY.  30 tablet  1  . warfarin (COUMADIN) 5 MG tablet TAKE 1 AND 1/2 TABLET BY MOUTH ON MON WED AND FRI WHILE TAKING 1 ON OTHER DAYS PER CLINIC  34 tablet  1  . carbidopa-levodopa (SINEMET) 25-100 MG per tablet Take 1 tablet by mouth 3 (three) times daily. Start 1/2 tablet 30 minutes before meals, after 1 week increase to whole tablet.  90 tablet  2   No facility-administered medications prior to visit.    PAST MEDICAL HISTORY: Past Medical History  Diagnosis Date  . PE (pulmonary embolism)     unprovoked PE completed 6 months of warfarin, warfarin d/ced 02/12/2010, repeat PE 07/10/2010 after a long car ride and now on lifelong coumadin.  [name] Hypertension   . Pituitary microadenoma     incidental finding CT 12/2009  . Prostate cancer   . Depression   . Hyperlipidemia   . Coronary artery disease, non-occlusive     with history of MI; Cath 2008 w multivessel nonobstructive CAD  . Dementia     PAST SURGICAL HISTORY: Past Surgical History  Procedure Laterality Date  . Prostatectomy      FAMILY HISTORY: Family History  Problem Relation Age of Onset  . Hypertension Father     Passed away from cerebral hemorrhage at age of 7.  [name] Dementia Mother     Passed away  . Hypertension Child     4 adult children  . Cervical cancer Other   . Lung cancer Other   . Stroke Other     SOCIAL HISTORY: History   Social History  . Marital Status: Married    Spouse Name: N/A    Number of Children: 4  . Years of Education: master's   Occupational History  .      retired   Social History Main Topics  . Smoking status: Never Smoker   . Smokeless tobacco: Not on file  . Alcohol Use: No  . Drug Use: No  . Sexual Activity:  Yes   Other Topics Concern  . Not on file   Social History Narrative   Lives with daughter. Wife also has severe dementia.     PHYSICAL EXAM  Filed Vitals:   07/24/13 1316  BP: 131/80  Pulse: 65  Temp: 97.5 F (36.4 C)  Height: 5' 6.4" (1.687 m)  Weight: 162 lb (73.483 kg)   Body mass index is 25.82 kg/(m^2).  Generalized: In  no acute distress, pleasant elderly AA male, well groomed, well developed  Neck: Supple, no carotid bruits  Cardiac: Irregular rate and rhythm, soft 1/6 murmur  Pulmonary: Clear to auscultation bilaterally  Musculoskeletal: mild kyphosis   Neurological examination  Mentation: Alert, language fluent.  MMSE today is 27/30, recall 3/3, AFT 8 and Clock drawing 1/4. GDS 5. Mood and affect appropriate. MASKED FACIES.  (Last visit: MMSE 25/30 deficit and date, recall 1/3, comprehension, drawing. Clock drawing 0/4. AFT 10. Geriatric depression score 3).  Cranial nerve II-XII: Pupils were equal round reactive to light extraocular movements were full, visual field were full on confrontational test. facial sensation and strength were normal. hearing was intact to finger rubbing bilaterally. Uvula tongue midline. head turning and shoulder shrug and were normal and symmetric. MOTOR: normal bulk and tone, full strength in the BUE, BLE, fine finger movements normal, no pronator drift. MILD COGWHEEL RIGIDIITY IN ARMS AND WRISTS, L>R.  SENSORY: normal and symmetric to light touch  COORDINATION:  Normal finger-nose-finger, heel-to-shin bilaterally. REFLEXES: 1+ and symmetric. POSITIVE MYERSON'S, POSITIVE SNOUT, NEGATIVE PALMOMENTAL.  GAIT/STATION: Rising up from seated position without assistance, STOOPED STANCE, without trunk ataxia, moderate stride, POOR ARM SWING ON RIGHT, TURNS IN 3 STEPS, UNABLE to perform tiptoe, and heel walking without difficulty. Romberg negative.  DIAGNOSTIC DATA (LABS, IMAGING, TESTING) - I reviewed patient records, labs, notes, testing and  imaging myself where available.  Lab Results  Component Value Date   WBC 2.8* 03/14/2013   HGB 11.1* 03/14/2013   HCT 32.8* 03/14/2013   MCV 88.4 03/14/2013   PLT 166 03/14/2013      Component Value Date/Time   NA 137 03/14/2013 1818   NA 140 11/12/2011   K 3.7 03/14/2013 1818   CL 104 03/14/2013 1818   CO2 24 03/14/2013 1818   GLUCOSE 102* 03/14/2013 1818   BUN 18 03/14/2013 1818   BUN 17 11/12/2011   CREATININE 1.03 03/14/2013 1818   CREATININE 1.19 03/08/2013 1146   CREATININE 1.1 11/12/2011   CALCIUM 8.6 03/14/2013 1818   PROT 6.4 02/20/2013 1955   ALBUMIN 3.2* 02/20/2013 1955   AST 35 02/20/2013 1955   ALT 28 02/20/2013 1955   ALKPHOS 56 02/20/2013 1955   BILITOT 0.3 02/20/2013 1955   GFRNONAA 67* 03/14/2013 1818   GFRAA 78* 03/14/2013 1818   Lab Results  Component Value Date   CHOL 207* 06/30/2012   HDL 56 06/30/2012   LDLCALC 125* 06/30/2012   TRIG 129 06/30/2012   CHOLHDL 3.7 06/30/2012    Lab Results  Component Value Date   VITAMINB12 429 01/19/2013   Lab Results  Component Value Date   TSH 0.739 02/20/2013    ASSESSMENT AND PLAN 81 patient with mild cognitive impairment which appears to progressed to mild dementia. Likely Alzheimer`s type. Remote history of right brain TIA in May 2011 and stable from a neurovascular standpoint. Aricept caused bradycardia. Moving easier and more steady on Sinemet.  PLAN:  I had a long discussion with the patient and family regarding his progressive memory decline and discussed treatment options.  Continue Sinemet for Parkinsonianism.  May titrate up to 3 times daily. Daughter to discuss starting Namenda ER with other family members. Gave printed Rx for titration pack and free month voucher.  Daughter to call for [facility] ER 28 mg Rx if desirable. Continue warfarin for stroke prevention admitted strict control of hypertension with blood pressure goal below 130/90. Start fish oil 2 capsules daily. Increase participation and cognitively challenging  activities.  Return for followup in 6 months with Heide Guile, nurse practitioner, sooner as needed.  Meds ordered this encounter  Medications  . NAMENDA XR TITRATION PACK 7 & 14 & 21 &28 MG CP24    Sig: Take 1 capsule by mouth daily.    Dispense:  28 capsule    Refill:  0    Order Specific Question:  Supervising Provider    Answer:  Pearlean Brownie, PRAMOD S [[id number]]  . carbidopa-levodopa (SINEMET) 25-100 MG per tablet    Sig: Take 1 tablet by mouth 3 (three) times daily. Start 1/2 tablet 30 minutes before meals, after 1 week increase to whole tablet.    Dispense:  270 tablet    Refill:  2    Order Specific Question:  Supervising Provider    Answer:  Patterson Hammersmith LAM, MSN, NP-C 07/24/2013, 2:24 PM [facility] [address], [address] [place], [place] [address] [phone]

## 2013-07-24 NOTE — Patient Instructions (Signed)
Continue Sinemet as discussed, may increase to 3 times a day.  Discuss Namenda, call office if you would like prescription.  Follow up in 6 months.

## 2013-07-27 ENCOUNTER — Other Ambulatory Visit (HOSPITAL_COMMUNITY): Payer: Medicare Other

## 2013-08-06 ENCOUNTER — Ambulatory Visit: Payer: Medicare Other

## 2013-08-13 ENCOUNTER — Ambulatory Visit (INDEPENDENT_AMBULATORY_CARE_PROVIDER_SITE_OTHER): Payer: Medicare Other | Admitting: Pharmacist

## 2013-08-13 DIAGNOSIS — Z86711 Personal history of pulmonary embolism: Secondary | ICD-10-CM

## 2013-08-13 DIAGNOSIS — Z7902 Long term (current) use of antithrombotics/antiplatelets: Secondary | ICD-10-CM

## 2013-08-13 DIAGNOSIS — Z7901 Long term (current) use of anticoagulants: Secondary | ICD-10-CM

## 2013-08-13 DIAGNOSIS — I2699 Other pulmonary embolism without acute cor pulmonale: Secondary | ICD-10-CM

## 2013-08-13 NOTE — Progress Notes (Signed)
Anti-Coagulation Progress Note  [name] [name] is a 79 y.o. male who is currently on an anti-coagulation regimen.    RECENT RESULTS: Recent results are below, the most recent result is correlated with a dose of 37.5 mg. per week: Lab Results  Component Value Date   INR 3.00 08/13/2013   INR 2.10 07/09/2013   INR 2.30 06/11/2013    ANTI-COAG DOSE: Anticoagulation Dose Instructions as of 08/13/2013     Glynis Smiles Tue Wed Thu Fri Sat   New Dose 5 mg 5 mg 2.5 mg 2.5 mg 5 mg 5 mg 5 mg       ANTICOAG SUMMARY: Anticoagulation Episode Summary   Current INR goal 2.0-3.0  Next INR check 08/20/2013  INR from last check 3.00 (08/13/2013)  Weekly max dose   Target end date Indefinite  [facility] check [facility]  Preferred lab   Send INR reminders to ANTICOAG [facility]   Indications  Pulmonary embolism [415.19] Encounter for long-term use of antiplatelets/antithrombotics [V58.63]        Comments History of multiple venous embolic episodes. Will continue to annual re-evaluate continued need for warfarin weighing risks vs. benefits.       Anticoagulation Care Providers   Provider Role Specialty Phone number   Farley Ly, MD  Internal Medicine [phone]      ANTICOAG TODAY: Anticoagulation Summary as of 08/13/2013   INR goal 2.0-3.0  Selected INR 3.00 (08/13/2013)  Next INR check 08/20/2013  Target end date Indefinite   Indications  Pulmonary embolism [415.19] Encounter for long-term use of antiplatelets/antithrombotics [V58.63]      Anticoagulation Episode Summary   [facility] check [facility]   Preferred lab    Send INR reminders to ANTICOAG [facility]   Comments History of multiple venous embolic episodes. Will continue to annual re-evaluate continued need for warfarin weighing risks vs. benefits.     Anticoagulation Care Providers   Provider Role Specialty Phone number   Farley Ly, MD  Internal Medicine [phone]      PATIENT  INSTRUCTIONS: Patient Instructions  Patient instructed to take medications as defined in the Anti-coagulation Track section of this encounter.  Patient instructed to OMIT today's dose; OMIT tomorrow's dose of warfarin. Patient verbalized understanding of these instructions.       FOLLOW-UP Return in 7 days (on 08/20/2013) for Follow up INR at 4:15PM.  [name], [name] Pharm.D., CACP

## 2013-08-13 NOTE — Patient Instructions (Signed)
Patient instructed to take medications as defined in the Anti-coagulation Track section of this encounter.  Patient instructed to OMIT today's dose; OMIT tomorrow's dose of warfarin. Patient verbalized understanding of these instructions.

## 2013-08-21 ENCOUNTER — Ambulatory Visit (INDEPENDENT_AMBULATORY_CARE_PROVIDER_SITE_OTHER): Payer: Medicare Other | Admitting: Pharmacist

## 2013-08-21 ENCOUNTER — Ambulatory Visit (INDEPENDENT_AMBULATORY_CARE_PROVIDER_SITE_OTHER): Payer: Medicare Other | Admitting: *Deleted

## 2013-08-21 DIAGNOSIS — Z7902 Long term (current) use of antithrombotics/antiplatelets: Secondary | ICD-10-CM

## 2013-08-21 DIAGNOSIS — Z86711 Personal history of pulmonary embolism: Secondary | ICD-10-CM

## 2013-08-21 DIAGNOSIS — Z23 Encounter for immunization: Secondary | ICD-10-CM

## 2013-08-21 DIAGNOSIS — I2699 Other pulmonary embolism without acute cor pulmonale: Secondary | ICD-10-CM

## 2013-08-21 DIAGNOSIS — Z7901 Long term (current) use of anticoagulants: Secondary | ICD-10-CM

## 2013-08-21 LAB — POCT INR: INR: 1.7

## 2013-08-21 NOTE — Progress Notes (Signed)
Anti-Coagulation Progress Note  [name] [name] is a 79 y.o. male who is currently on an anti-coagulation regimen.    RECENT RESULTS: Recent results are below, the most recent result is correlated with a dose of 30 mg. per week: Lab Results  Component Value Date   INR 1.70 08/21/2013   INR 3.00 08/13/2013   INR 2.10 07/09/2013    ANTI-COAG DOSE: Anticoagulation Dose Instructions as of 08/21/2013     Glynis Smiles Tue Wed Thu Fri Sat   New Dose 5 mg 5 mg 5 mg 5 mg 5 mg 5 mg 5 mg       ANTICOAG SUMMARY: Anticoagulation Episode Summary   Current INR goal 2.0-3.0  Next INR check 09/10/2013  INR from last check 1.70! (08/21/2013)  Weekly max dose   Target end date Indefinite  [facility]  Preferred lab   Send INR reminders to ANTICOAG IMP   Indications  Pulmonary embolism [415.19] Encounter for long-term use of antiplatelets/antithrombotics [V58.63]        Comments History of multiple venous embolic episodes. Will continue to annual re-evaluate continued need for warfarin weighing risks vs. benefits.       Anticoagulation Care Providers   Provider Role Specialty Phone number   Farley Ly, MD  Internal Medicine [phone]      ANTICOAG TODAY: Anticoagulation Summary as of 08/21/2013   INR goal 2.0-3.0  Selected INR 1.70! (08/21/2013)  Next INR check 09/10/2013  Target end date Indefinite   Indications  Pulmonary embolism [415.19] Encounter for long-term use of antiplatelets/antithrombotics [V58.63]      Anticoagulation Episode Summary   [facility] check location [facility]   Preferred lab    Send INR reminders to ANTICOAG [facility]   Comments History of multiple venous embolic episodes. Will continue to annual re-evaluate continued need for warfarin weighing risks vs. benefits.     Anticoagulation Care Providers   Provider Role Specialty Phone number   Farley Ly, MD  Internal Medicine [phone]      PATIENT INSTRUCTIONS: Patient  Instructions  Patient instructed to take medications as defined in the Anti-coagulation Track section of this encounter.  Patient instructed to take today's dose.  Patient verbalized understanding of these instructions.       FOLLOW-UP Return in 3 weeks (on 09/10/2013) for Follow up INR at 1045h.  [name], [name] Pharm.D., CACP

## 2013-08-21 NOTE — Patient Instructions (Signed)
Patient instructed to take medications as defined in the Anti-coagulation Track section of this encounter.  Patient instructed to take today's dose.  Patient verbalized understanding of these instructions.    

## 2013-08-23 NOTE — Progress Notes (Signed)
I have reviewed Dr. Saralyn Pilar note.  [name] [name] is on coumadin for pulmonary embolism.

## 2013-08-28 ENCOUNTER — Other Ambulatory Visit: Payer: Self-pay | Admitting: Internal Medicine

## 2013-09-10 ENCOUNTER — Ambulatory Visit (INDEPENDENT_AMBULATORY_CARE_PROVIDER_SITE_OTHER): Payer: Medicare Other | Admitting: Pharmacist

## 2013-09-10 DIAGNOSIS — Z86711 Personal history of pulmonary embolism: Secondary | ICD-10-CM

## 2013-09-10 DIAGNOSIS — Z7901 Long term (current) use of anticoagulants: Secondary | ICD-10-CM

## 2013-09-10 DIAGNOSIS — I2699 Other pulmonary embolism without acute cor pulmonale: Secondary | ICD-10-CM

## 2013-09-10 DIAGNOSIS — Z7902 Long term (current) use of antithrombotics/antiplatelets: Secondary | ICD-10-CM

## 2013-09-10 LAB — POCT INR: INR: 2.5

## 2013-09-10 NOTE — Progress Notes (Signed)
Anti-Coagulation Progress Note  [name] [name] is a 79 y.o. male who is currently on an anti-coagulation regimen.    RECENT RESULTS: Recent results are below, the most recent result is correlated with a dose of 35 mg. per week: Lab Results  Component Value Date   INR 2.50 09/10/2013   INR 1.70 08/21/2013   INR 3.00 08/13/2013    ANTI-COAG DOSE: Anticoagulation Dose Instructions as of 09/10/2013     Glynis Smiles Tue Wed Thu Fri Sat   New Dose 5 mg 5 mg 5 mg 5 mg 5 mg 5 mg 5 mg       ANTICOAG SUMMARY: Anticoagulation Episode Summary   Current INR goal 2.0-3.0  Next INR check 10/15/2013  INR from last check 2.50 (09/10/2013)  Weekly max dose   Target end date Indefinite  [facility] check [facility]  Preferred lab   Send INR reminders to ANTICOAG [facility]   Indications  Pulmonary embolism [415.19] Encounter for long-term use of antiplatelets/antithrombotics [V58.63]        Comments History of multiple venous embolic episodes. Will continue to annual re-evaluate continued need for warfarin weighing risks vs. benefits.       Anticoagulation Care Providers   Provider Role Specialty Phone number   Farley Ly, MD  Internal Medicine [phone]      ANTICOAG TODAY: Anticoagulation Summary as of 09/10/2013   INR goal 2.0-3.0  Selected INR 2.50 (09/10/2013)  Next INR check 10/15/2013  Target end date Indefinite   Indications  Pulmonary embolism [415.19] Encounter for long-term use of antiplatelets/antithrombotics [V58.63]      Anticoagulation Episode Summary   [facility] check location [facility]   Preferred lab    Send INR reminders to ANTICOAG [facility]   Comments History of multiple venous embolic episodes. Will continue to annual re-evaluate continued need for warfarin weighing risks vs. benefits.     Anticoagulation Care Providers   Provider Role Specialty Phone number   Farley Ly, MD  Internal Medicine [phone]      PATIENT INSTRUCTIONS: Patient  Instructions  Patient instructed to take medications as defined in the Anti-coagulation Track section of this encounter.  Patient instructed to take today's dose.  Patient verbalized understanding of these instructions.     .                FOLLOW-UP Return in 5 weeks (on 10/15/2013) for Follow up INR at 1115h.  [name], [name] Pharm.D., CACP

## 2013-09-10 NOTE — Progress Notes (Deleted)
Anti-Coagulation Progress Note  [name] [name] is a 79 y.o. male who is currently on an anti-coagulation regimen.    RECENT RESULTS: Recent results are below, the most recent result is correlated with a dose of 35 mg. per week: Lab Results  Component Value Date   INR 2.50 09/10/2013   INR 1.70 08/21/2013   INR 3.00 08/13/2013    ANTI-COAG DOSE: Anticoagulation Dose Instructions as of 09/10/2013     Glynis Smiles Tue Wed Thu Fri Sat   New Dose 5 mg 5 mg 5 mg 5 mg 5 mg 5 mg 5 mg       ANTICOAG SUMMARY: Anticoagulation Episode Summary   Current INR goal 2.0-3.0  Next INR check 10/15/2013  INR from last check 2.50 (09/10/2013)  Weekly max dose   Target end date Indefinite  INR check location [facility]  Preferred lab   Send INR reminders to [facility]AG [facility]   Indications  Pulmonary embolism [415.19] Encounter for long-term use of antiplatelets/antithrombotics [V58.63]        Comments History of multiple venous embolic episodes. Will continue to annual re-evaluate continued need for warfarin weighing risks vs. benefits.       Anticoagulation Care Providers   Provider Role Specialty Phone number   Farley Ly, MD  Internal Medicine [phone]      ANTICOAG TODAY: Anticoagulation Summary as of 09/10/2013   INR goal 2.0-3.0  Selected INR 2.50 (09/10/2013)  Next INR check 10/15/2013  Target end date Indefinite   Indications  Pulmonary embolism [415.19] Encounter for long-term use of antiplatelets/antithrombotics [V58.63]      Anticoagulation Episode Summary   [facility]R check location [facility]   Preferred lab    Send INR reminders to [facility]   Comments History of multiple venous embolic episodes. Will continue to annual re-evaluate continued need for warfarin weighing risks vs. benefits.     Anticoagulation Care Providers   Provider Role Specialty Phone number   Farley Ly, MD  Internal Medicine [phone]      PATIENT INSTRUCTIONS: Patient  Instructions   Anti-Coagulation Progress Note  [name] [name] is a 79 y.o. male who is currently on an anti-coagulation regimen.    RECENT RESULTS: Recent results are below, the most recent result is correlated with a dose of 35 mg. per week: Lab Results  Component Value Date   INR 2.50 09/10/2013   INR 1.70 08/21/2013   INR 3.00 08/13/2013    ANTI-COAG DOSE: Anticoagulation Dose Instructions as of 09/10/2013     Glynis Smiles Tue Wed Thu Fri Sat   New Dose 5 mg 5 mg 5 mg 5 mg 5 mg 5 mg 5 mg       ANTICOAG SUMMARY: @ANTICOAGSUMMARY @  ANTICOAG TODAY: @ANTICOAGTODAY @  PATIENT INSTRUCTIONS: @PATINSTR @   FOLLOW-UP No Follow-up on file.  [name], [name] Pharm.D., CACP      FOLLOW-UP Return in 5 weeks (on 10/15/2013) for Follow up INR at 1115h.  [name], [name] Pharm.D., CACP  Anti-Coagulation Progress Note  [name] [name] is a 79 y.o. male who is currently on an anti-coagulation regimen.    RECENT RESULTS: Recent results are below, the most recent result is correlated with a dose of *** mg. per week: Lab Results  Component Value Date   INR 2.50 09/10/2013   INR 1.70 08/21/2013   INR 3.00 08/13/2013    ANTI-COAG DOSE: Anticoagulation Dose Instructions as of 09/10/2013     Glynis Smiles Tue Wed Thu Fri Sat  New Dose 5 mg 5 mg 5 mg 5 mg 5 mg 5 mg 5 mg       ANTICOAG SUMMARY: Anticoagulation Episode Summary   Current INR goal 2.0-3.0  Next INR check 10/15/2013  INR from last check 2.50 (09/10/2013)  Weekly max dose   Target end date Indefinite  [facility] check [facility]  Preferred lab   Send INR reminders to [facility]   Indications  Pulmonary embolism [415.19] Encounter for long-term use of antiplatelets/antithrombotics [V58.63]        Comments History of multiple venous embolic episodes. Will continue to annual re-evaluate continued need for warfarin weighing risks vs. benefits.       Anticoagulation Care Providers   Provider Role  Specialty Phone number   Farley Ly, MD  Internal Medicine [phone]      ANTICOAG TODAY: Anticoagulation Summary as of 09/10/2013   INR goal 2.0-3.0  Selected INR 2.50 (09/10/2013)  Next INR check 10/15/2013  Target end date Indefinite   Indications  Pulmonary embolism [415.19] Encounter for long-term use of antiplatelets/antithrombotics [V58.63]      Anticoagulation Episode Summary   [facility] check location [facility]   Preferred lab    Send INR reminders to [facility]   Comments History of multiple venous embolic episodes. Will continue to annual re-evaluate continued need for warfarin weighing risks vs. benefits.     Anticoagulation Care Providers   Provider Role Specialty Phone number   Farley Ly, MD  Internal Medicine [phone]      PATIENT INSTRUCTIONS: Patient Instructions   Anti-Coagulation Progress Note  [name] [name] is a 79 y.o. male who is currently on an anti-coagulation regimen.    RECENT RESULTS: Recent results are below, the most recent result is correlated with a dose of 35 mg. per week: Lab Results  Component Value Date   INR 2.50 09/10/2013   INR 1.70 08/21/2013   INR 3.00 08/13/2013    ANTI-COAG DOSE: Anticoagulation Dose Instructions as of 09/10/2013     Glynis Smiles Tue Wed Thu Fri Sat   New Dose 5 mg 5 mg 5 mg 5 mg 5 mg 5 mg 5 mg       ANTICOAG SUMMARY: @ANTICOAGSUMMARY @  ANTICOAG TODAY: @ANTICOAGTODAY @  PATIENT INSTRUCTIONS: @PATINSTR @   FOLLOW-UP No Follow-up on file.  [name], [name] Pharm.D., CACP      FOLLOW-UP Return in 5 weeks (on 10/15/2013) for Follow up INR at 1115h.  [name], [name] Pharm.D., CACP

## 2013-09-10 NOTE — Patient Instructions (Addendum)
Patient instructed to take medications as defined in the Anti-coagulation Track section of this encounter.  Patient instructed to take today's dose.  Patient verbalized understanding of these instructions.    

## 2013-09-10 NOTE — Progress Notes (Signed)
Indication: Recurrent venous thrombosis. Duration: Lifelong. INR: At target. Agree with Dr. Groce's assessment and plan. 

## 2013-09-12 ENCOUNTER — Other Ambulatory Visit: Payer: Self-pay | Admitting: Internal Medicine

## 2013-09-18 ENCOUNTER — Telehealth: Payer: Self-pay | Admitting: *Deleted

## 2013-09-18 NOTE — Telephone Encounter (Signed)
Daughter called about father  Seems to be SOB and having a lot of pain lower back area. Since it is 4:30PM and clinic is closed the next 2 days suggest to take father to ER to be checked. A nurse that was checking on daughter mother - suggested for daughter to call clinic about her father. Stanton Kidney Markise Haymer RN 09/18/13 4:35PM

## 2013-09-24 ENCOUNTER — Ambulatory Visit (INDEPENDENT_AMBULATORY_CARE_PROVIDER_SITE_OTHER): Payer: Commercial Managed Care - HMO | Admitting: Internal Medicine

## 2013-09-24 ENCOUNTER — Encounter: Payer: Self-pay | Admitting: Internal Medicine

## 2013-09-24 ENCOUNTER — Ambulatory Visit (HOSPITAL_COMMUNITY)
Admission: RE | Admit: 2013-09-24 | Discharge: 2013-09-24 | Disposition: A | Discharge status: Home / Self Care | Payer: Medicare HMO | Source: Ambulatory Visit | Attending: Internal Medicine | Admitting: Internal Medicine

## 2013-09-24 VITALS — BP 130/81 | HR 71 | Temp 97.0°F | Wt 161.7 lb

## 2013-09-24 DIAGNOSIS — M545 Low back pain, unspecified: Secondary | ICD-10-CM

## 2013-09-24 DIAGNOSIS — M47817 Spondylosis without myelopathy or radiculopathy, lumbosacral region: Secondary | ICD-10-CM | POA: Insufficient documentation

## 2013-09-24 DIAGNOSIS — Z7901 Long term (current) use of anticoagulants: Secondary | ICD-10-CM

## 2013-09-24 DIAGNOSIS — M4120 Other idiopathic scoliosis, site unspecified: Secondary | ICD-10-CM | POA: Insufficient documentation

## 2013-09-24 LAB — SEDIMENTATION RATE: Sed Rate: 11 mm/hr (ref 0–16)

## 2013-09-24 MED ORDER — DICLOFENAC SODIUM 1 % TD GEL: 2.0000 g | Freq: Four times a day (QID) | TRANSDERMAL | Status: DC

## 2013-09-24 MED ORDER — ACETAMINOPHEN 500 MG PO TABS: 1000.0000 mg | ORAL_TABLET | Freq: Three times a day (TID) | ORAL | Status: AC

## 2013-09-24 NOTE — Progress Notes (Signed)
Subjective:   Patient ID: [name] [name] male   DOB: 1931-08-25 80 y.o.   MRN: [id number]  Chief Complaint  Patient presents with  . Back Pain    chronic "getting worse"    HPI: [name] is a 80 y.o. man with h/o HLD, HTN, DJD, PE on anticoagulation, Alzheimer's dementia and CAD who presents for an acute visit due to back pain.  Accompanied by daughter today.  Back pain for about 3 years or more, but complaining more about it about 2 years ago.  Seems to be hurting to the point of grimacing per daughter. Even sitting is a problem, better with laying down. Worse with bending over and increased movement. Described as crampy, not sharp. Pain may be constant and all day once it starts.  Has known disc degeneration and change in curvature per daughter (has been seen by a chiropracter and Dr. Raliegh Ip).  Had tried 2-3 PT sessions before April.  No falls since fall in June 2014 due to vision obstruction.  Has tried ibuprofen (800mg  1-2x/day). & heat with some relief.  No history of back trauma. Achy feeling in legs with 1 episode of sharp shooting pain (but may have been during a gout flare). Pain does not radiate to groin, unclear if radiates to legs.  Has some urine incontinence during sleep - leakage that has been going on for years, after prostate removed. No numbness/tingling in feet.  No fevers. No weight loss. No hematuria.  Not awoken from sleep due to pain.    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    Cardiovascular: Denies chest pain, palpitations and leg swelling.  Gastrointestinal: Denies nausea, vomiting, abdominal pain, diarrhea, constipation,blood in stool and abdominal distention.  Genitourinary: Denies dysuria, hematuria, flank pain and difficulty urinating.  Musculoskeletal: Denies myalgias, joint swelling, some pain with right knee Skin: Denies pallor, rash and wound.  Neurological: Denies dizziness, seizures, syncope, weakness, lightheadedness, numbness    Past Medical History  Diagnosis Date  . PE (pulmonary embolism)     unprovoked PE completed 6 months of warfarin, warfarin d/ced 02/12/2010, repeat PE 07/10/2010 after a long car ride and now on lifelong coumadin.  [name] Hypertension   . Pituitary microadenoma     incidental finding CT 12/2009  . Prostate cancer   . Depression   . Hyperlipidemia   . Coronary artery disease, non-occlusive     with history of MI; Cath 2008 w multivessel nonobstructive CAD  . Dementia    Current Outpatient Prescriptions  Medication Sig Dispense Refill  . amLODipine (NORVASC) 10 MG tablet Take 10 mg by mouth daily.      [name] atorvastatin (LIPITOR) 40 MG tablet TAKE 1 TABLET BY MOUTH DAILY  30 tablet  3  . b complex vitamins tablet Take 1 tablet by mouth daily.      . Calcium Carbonate-Vitamin D (CALCIUM-VITAMIN D) 500-200 MG-UNIT per tablet Take 1 tablet by mouth daily.      . carbidopa-levodopa (SINEMET) 25-100 MG per tablet Take 1 tablet by mouth 3 (three) times daily. Start 1/2 tablet 30 minutes before  meals, after 1 week increase to whole tablet.  270 tablet  2  . Cholecalciferol (VITAMIN D3) 2000 UNITS TABS Take 2,000 Units by mouth daily.       . fish oil-omega-3 fatty acids 1000 MG capsule Take 2 g by mouth daily. 2 tabs daily      . ibuprofen (ADVIL,MOTRIN) 200 MG tablet Take 200 mg by mouth 2 (two) times daily as needed for pain. For pain      . lisinopril (PRINIVIL,ZESTRIL) 40 MG tablet TAKE 1 TABLET BY MOUTH DAILY  30 tablet  0  .  Multiple Vitamins-Minerals (MULTIVITAMIN WITH MINERALS) tablet Take 1 tablet by mouth daily.      [name] NAMENDA XR TITRATION PACK 7 & 14 & 21 &28 MG CP24 Take 1 capsule by mouth daily.  28 capsule  0  . omeprazole (PRILOSEC) 20 MG capsule Take 20 mg by mouth daily.      . sertraline (ZOLOFT) 25 MG tablet TAKE 1 TABLET (25 MG TOTAL) BY MOUTH DAILY.  30 tablet  1  . warfarin (COUMADIN) 5 MG tablet TAKE 1 AND 1/2 TABLET BY MOUTH ON MON WED AND FRI WHILE TAKING 1 ON OTHER DAYS PER CLINIC  34 tablet  1   No current facility-administered medications for this visit.   Family History  Problem Relation Age of Onset  . Hypertension Father     Passed away from cerebral hemorrhage at age of 59.  [name] Dementia Mother     Passed away  . Hypertension Child     4 adult children  . Cervical cancer Other   . Lung cancer Other   . Stroke Other    History   Social History  . Marital Status: Married    Spouse Name: N/A    Number of Children: 4  . Years of Education: master's   Occupational History  .      retired   Social History Main Topics  . Smoking status: Never Smoker   . Smokeless tobacco: Not on file  . Alcohol Use: No  . Drug Use: No  . Sexual Activity: Yes   Other Topics Concern  . Not on file   Social History Narrative   Lives with daughter. Wife also has severe dementia.     Objective:  Physical Exam: Filed Vitals:   09/24/13 1336  BP: 130/81  Pulse: 71  Temp: 97 F (36.1 C)  TempSrc: Oral  Weight: 161 lb 11.2 oz (73.347 kg)  SpO2: 98%   General: pleasant, appears as stated age, no distress  HEENT: PERRL, EOMI, no scleral icterus  Cardiac: RRR, no rubs, murmurs or gallops  Pulm: clear to auscultation bilaterally, moving normal volumes of air  Abd: soft, nontender, nondistended, BS normoactive  Ext: warm and well perfused, no pedal edema MSK: No tenderness over right hip or lumbar spine, he does have a right sided nontender mass lateral to his lumbar spine, possibly  4-5cm in diameter), negative straight leg Neuro: alert and oriented X3, cranial nerves II-XII grossly intact  Assessment & Plan:  Case and care discussed with Dr. Marinda Elk.  Please see problem oriented charting for further details. Patient to return in 2 weeks for back pain follow up.

## 2013-09-24 NOTE — Assessment & Plan Note (Signed)
Patient has history of DJD, but no recent imaging of right hip or lumbar spine.  Given that he is >80 years old, and no sciatica is present, I will start with plain films and ESR, if either are abnormal, I will consider CT vs MRI (especially given mild pancytopenia and palpable mass on back).  He is not interested in aggressive mgmt at this time, but we will discuss further once we have results of the XR and ESR.  For pain mgmt, we will start with tylenol $RemoveBefo'1000mg'ZAnpYGssPTD$  TID (scheduled) and voltaren gel QID prn.  I explained that since he is taking ASA 81 & warfarin, high dose ibuprofen increases risk of bleeding, so we should avoid this for now while we try the above regimen.    He will follow up in 2 weeks, at which point we will also consider PT vs integrative therapy (suggested by daughter, includes massage and other techniques in addition to PT)

## 2013-09-24 NOTE — Patient Instructions (Signed)
-  Please try tylenol (also known as acetaminophen) 1000mg  three times daily - take this scheduled, even if you have no pain.  You may also apply voltaren gel (sent to your pharmacy) up to 4 times daily as needed. We can talk about physical therapy/integrative therapy at your next visit  Please be sure to bring all of your medications with you to every visit.  Should you have any new or worsening symptoms, please be sure to call the clinic at [phone].

## 2013-09-25 NOTE — Progress Notes (Signed)
Case discussed with Dr. Sharda soon after the resident saw the patient.  We reviewed the resident's history and exam and pertinent patient test results.  I agree with the assessment, diagnosis, and plan of care documented in the resident's note. 

## 2013-10-01 ENCOUNTER — Telehealth: Payer: Self-pay | Admitting: Nurse Practitioner

## 2013-10-01 ENCOUNTER — Other Ambulatory Visit: Payer: Self-pay | Admitting: Nurse Practitioner

## 2013-10-01 MED ORDER — MEMANTINE HCL ER 28 MG PO CP24: 1.0000 | ORAL_CAPSULE | Freq: Every day | ORAL | Status: DC

## 2013-10-01 NOTE — Telephone Encounter (Signed)
Pharmacy has stated that manufacturer of Namenda XR has stopped making this and are requesting a different medication be prescribed. Please call to advise.

## 2013-10-01 NOTE — Telephone Encounter (Signed)
Please call family back, I think the pharmacy may not have the titration pack.  They may come for a sample titration pack I will leave at the front and a sample of Namenda XR 28mg  with a prescription and free month card.

## 2013-10-02 ENCOUNTER — Other Ambulatory Visit: Payer: Self-pay | Admitting: Nurse Practitioner

## 2013-10-02 MED ORDER — MEMANTINE HCL 10 MG PO TABS: 10.0000 mg | ORAL_TABLET | Freq: Two times a day (BID) | ORAL | Status: DC

## 2013-10-02 NOTE — Telephone Encounter (Signed)
I will send in a prescription for the twice daily Namenda.  Some pharmacies still have this.  If their pharmacy has neither, I would recommend calling other pharmacies.

## 2013-10-02 NOTE — Telephone Encounter (Signed)
Called and informed per Ms Lam's note that Namenda XR 28mg  would be up front and ready for pick up , will pick up but she said that the pharmacy said that it has been back ordered for 6 mos. Will there be something else prescribed if still unavailable in future?

## 2013-10-02 NOTE — Progress Notes (Signed)
Patient's pharmacy, [facility], has told them that Namenda XR is on backorder for 6 months.  I have sent in a prescription for the older twice daily Namenda.

## 2013-10-02 NOTE — Telephone Encounter (Signed)
Shared Ms Lam's note , verbalized understanding

## 2013-10-09 ENCOUNTER — Ambulatory Visit (INDEPENDENT_AMBULATORY_CARE_PROVIDER_SITE_OTHER): Payer: Medicare HMO | Admitting: Internal Medicine

## 2013-10-09 ENCOUNTER — Encounter: Payer: Self-pay | Admitting: Internal Medicine

## 2013-10-09 VITALS — BP 129/85 | HR 70 | Temp 97.4°F | Wt 162.2 lb

## 2013-10-09 DIAGNOSIS — M545 Low back pain, unspecified: Secondary | ICD-10-CM

## 2013-10-09 DIAGNOSIS — M47817 Spondylosis without myelopathy or radiculopathy, lumbosacral region: Secondary | ICD-10-CM

## 2013-10-09 NOTE — Progress Notes (Signed)
Subjective:   Patient ID: [name] [name] male   DOB: 1930-09-30 80 y.o.   MRN: [id number]  Chief Complaint  Patient presents with  . Back Pain    follow up    HPI: [name] [name] is a 80 y.o. man with h/o HLD, HTN, DJD, PE on anticoagulation, Alzheimer's dementia and CAD who presents for a follow up visit due to back pain. He was seen for this problem on 09/24/13. At that visit I ordered LXR and ESR, and started scheduled tylenol with prn voltaren gel for pain mgmt.  Unable to fill voltaren gel due to cost.   Since last visit, he reports back pain is improved and is manageable, as long as he isn't in the cold - has a heating pad. Believes tylenol to be helpful with pain as well - taking 1000 TID.  No new weakness.    Review of Systems: Constitutional: Denies fever, chills, diaphoresis, decreased energy and appetite, sleeping better than before since starting scheduled tylenol HEENT: Denies photophobia, eye pain, redness, hearing loss, ear pain, congestion, sore throat, rhinorrhea, sneezing, mouth sores, trouble swallowing, neck pain, neck stiffness Respiratory: Denies SOB, DOE, cough, chest tightness, and wheezing.  Cardiovascular: Denies chest pain, palpitations and leg swelling.  Gastrointestinal: Denies nausea, vomiting, abdominal pain, diarrhea, constipation,blood in stool and abdominal distention.  Genitourinary: Denies dysuria, hematuria, flank pain and difficulty urinating.  Musculoskeletal: Denies myalgias, joint swelling, chronic pain of right knee  Skin: Denies pallor, rash and wound.  Neurological: Denies dizziness, seizures, syncope, weakness, lightheadedness, numbness  Psych: admits to feeling anxious, but sleeping better, good appetite   Past Medical History  Diagnosis Date  . PE (pulmonary embolism)     unprovoked PE completed 6 months of warfarin, warfarin d/ced 02/12/2010, repeat PE 07/10/2010 after a long car ride and now on lifelong coumadin.  [name] Hypertension     . Pituitary microadenoma     incidental finding CT 12/2009  . Prostate cancer   . Depression   . Hyperlipidemia   . Coronary artery disease, non-occlusive     with history of MI; Cath 2008 w multivessel nonobstructive CAD  . Dementia    Current Outpatient Prescriptions  Medication Sig Dispense Refill  . acetaminophen (TYLENOL) 500 MG tablet Take 2 tablets (1,000 mg total) by mouth 3 (three) times daily.  100 tablet  2  . amLODipine (NORVASC) 10 MG tablet Take 10 mg by mouth daily.      [name] aspirin 81 MG tablet Take 81 mg by mouth daily.      [name] atorvastatin (LIPITOR) 40 MG tablet TAKE 1 TABLET BY MOUTH DAILY  30 tablet  3  . b complex vitamins tablet Take 1 tablet by mouth daily.      . Calcium Carbonate-Vitamin D (CALCIUM-VITAMIN D) 500-200 MG-UNIT per tablet Take 1 tablet by mouth daily.      . carbidopa-levodopa (SINEMET) 25-100 MG per tablet Take 1 tablet by mouth 3 (three) times daily. Start 1/2 tablet 30 minutes before meals, after 1 week increase to whole tablet.  270 tablet  2  . Cholecalciferol (VITAMIN D3) 2000 UNITS TABS Take 2,000 Units by mouth daily.       . diclofenac sodium (VOLTAREN) 1 % GEL Apply 2 g topically 4 (four) times daily.  100 g  11  . fish oil-omega-3 fatty acids 1000 MG capsule Take 2 g by mouth daily. 2 tabs daily      . ibuprofen (ADVIL,MOTRIN) 200 MG tablet Take 200 mg  by mouth 2 (two) times daily as needed for pain. For pain      . lisinopril (PRINIVIL,ZESTRIL) 40 MG tablet TAKE 1 TABLET BY MOUTH DAILY  30 tablet  0  . memantine (NAMENDA) 10 MG tablet Take 1 tablet (10 mg total) by mouth 2 (two) times daily.  60 tablet  5  . Memantine HCl ER 28 MG CP24 Take 28 mg by mouth daily.  30 capsule  5  . Multiple Vitamins-Minerals (MULTIVITAMIN WITH MINERALS) tablet Take 1 tablet by mouth daily.      [name] omeprazole (PRILOSEC) 20 MG capsule Take 20 mg by mouth daily.      . sertraline (ZOLOFT) 25 MG tablet TAKE 1 TABLET (25 MG TOTAL) BY MOUTH DAILY.  30 tablet  1  .  warfarin (COUMADIN) 5 MG tablet TAKE 1 AND 1/2 TABLET BY MOUTH ON MON WED AND FRI WHILE TAKING 1 ON OTHER DAYS PER CLINIC  34 tablet  1   No current facility-administered medications for this visit.   Family History  Problem Relation Age of Onset  . Hypertension Father     Passed away from cerebral hemorrhage at age of 71.  [name] Dementia Mother     Passed away  . Hypertension Child     4 adult children  . Cervical cancer Other   . Lung cancer Other   . Stroke Other    History   Social History  . Marital Status: Married    Spouse Name: N/A    Number of Children: 4  . Years of Education: master's   Occupational History  .      retired   Social History Main Topics  . Smoking status: Never Smoker   . Smokeless tobacco: Not on file  . Alcohol Use: No  . Drug Use: No  . Sexual Activity: Yes   Other Topics Concern  . Not on file   Social History Narrative   Lives with daughter. Wife also has severe dementia.     Objective:  Physical Exam: Filed Vitals:   10/09/13 1427  BP: 129/85  Pulse: 70  Temp: 97.4 F (36.3 C)  TempSrc: Oral  Weight: 162 lb 3.2 oz (73.573 kg)  SpO2: 97%   General: pleasant, appears as stated age, no distress  HEENT: PERRL, EOMI, no scleral icterus  Cardiac: RRR, no rubs, murmurs or gallops  Pulm: clear to auscultation bilaterally, moving normal volumes of air  Abd: soft, nontender, nondistended, BS normoactive  Ext: warm and well perfused, no pedal edema  Neuro: alert and oriented X3, cranial nerves II-XII grossly intact, 5/5 strength of b/l LE   Assessment & Plan:  Case and care discussed with Dr. Eppie Gibson.  Please see problem oriented charting for further details. Patient to return in 3-6 months for routine follow up.

## 2013-10-09 NOTE — Assessment & Plan Note (Signed)
ESR wnl, LXR reveals progressive spondylosis.  Will continue tylenol $RemoveBeforeDE'1000mg'[id number]$  TID, patient has tramadol at home - will try this if needed (prescribed by [facility]) - if this is effective, they will let me know, and I will prescribe if needed in the future.  Change made to medicaid card, so maybe able to afford voltaren, but may use OTC bengay in the interim.  Also refer to PT (patient prefers integrative therapy)

## 2013-10-09 NOTE — Patient Instructions (Signed)
-  Regarding your low back pain, continue tylenol 1000mg  three times daily.  Also continue therapy with heating pad. May use over the counter Bengay (be sure not to use heating pad when using this cream).  If insurance covers voltaren, consider pickign this up.  You may do a trial of tramadol, if this offers relief, let me know for future prescriptions.  -I am placing a referral for physical therapy at [facility]: [address], [place], [place] [address] [phone]

## 2013-10-14 NOTE — Progress Notes (Signed)
Case discussed with Dr. Sharda soon after the resident saw the patient.  We reviewed the resident's history and exam and pertinent patient test results.  I agree with the assessment, diagnosis, and plan of care documented in the resident's note. 

## 2013-10-15 ENCOUNTER — Ambulatory Visit (INDEPENDENT_AMBULATORY_CARE_PROVIDER_SITE_OTHER): Payer: Medicare HMO | Admitting: Pharmacist

## 2013-10-15 DIAGNOSIS — Z7901 Long term (current) use of anticoagulants: Secondary | ICD-10-CM

## 2013-10-15 DIAGNOSIS — Z7902 Long term (current) use of antithrombotics/antiplatelets: Secondary | ICD-10-CM

## 2013-10-15 DIAGNOSIS — Z86711 Personal history of pulmonary embolism: Secondary | ICD-10-CM

## 2013-10-15 DIAGNOSIS — I2699 Other pulmonary embolism without acute cor pulmonale: Secondary | ICD-10-CM

## 2013-10-15 LAB — POCT INR: INR: 2.4

## 2013-10-16 NOTE — Progress Notes (Signed)
Anti-Coagulation Progress Note  [name] [name] is a 80 y.o. male who is currently on an anti-coagulation regimen.    RECENT RESULTS: Recent results are below, the most recent result is correlated with a dose of 35 mg. per week: Lab Results  Component Value Date   INR 2.40 10/15/2013   INR 2.50 09/10/2013   INR 1.70 08/21/2013    ANTI-COAG DOSE: Anticoagulation Dose Instructions as of 10/15/2013     [name] Tue Wed Thu Fri Sat   New Dose 5 mg 5 mg 5 mg 5 mg 5 mg 5 mg 5 mg       ANTICOAG SUMMARY: Anticoagulation Episode Summary   Current INR goal 2.0-3.0  Next INR check 11/12/2013  INR from last check 2.40 (10/15/2013)  Weekly max dose   Target end date Indefinite  [facility] check location [facility]  Preferred lab   Send INR reminders to [facility]   Indications  Pulmonary embolism [415.19] Encounter for long-term use of antiplatelets/antithrombotics [V58.63]        Comments History of multiple venous embolic episodes. Will continue to annual re-evaluate continued need for warfarin weighing risks vs. benefits.       Anticoagulation Care Providers   Provider Role Specialty Phone number   Axel Filler, MD  Internal Medicine [phone]      ANTICOAG TODAY: Anticoagulation Summary as of 10/15/2013   INR goal 2.0-3.0  Selected INR 2.40 (10/15/2013)  Next INR check 11/12/2013  Target end date Indefinite   Indications  Pulmonary embolism [415.19] Encounter for long-term use of antiplatelets/antithrombotics [V58.63]      Anticoagulation Episode Summary   [facility] check [facility]   Preferred lab    Send INR reminders to [facility]   Comments History of multiple venous embolic episodes. Will continue to annual re-evaluate continued need for warfarin weighing risks vs. benefits.     Anticoagulation Care Providers   Provider Role Specialty Phone number   Axel Filler, MD  Internal Medicine [phone]      PATIENT INSTRUCTIONS: Patient  Instructions  Patient instructed to take medications as defined in the Anti-coagulation Track section of this encounter.  Patient instructed to take today's dose.  Patient verbalized understanding of these instructions.       FOLLOW-UP Return in 4 weeks (on 11/12/2013) for Follow up INR at 2:15PM.  Jorene Guest, [name] Pharm.D., CACP

## 2013-10-16 NOTE — Patient Instructions (Signed)
Patient instructed to take medications as defined in the Anti-coagulation Track section of this encounter.  Patient instructed to take today's dose.  Patient verbalized understanding of these instructions.    

## 2013-10-17 NOTE — Progress Notes (Signed)
Indication: Recurrent venous thrombosis. Duration: Lifelong. INR: At target. Agree with Dr. Gladstone Pih assessment and plan.

## 2013-10-27 ENCOUNTER — Other Ambulatory Visit: Payer: Self-pay | Admitting: Internal Medicine

## 2013-10-30 ENCOUNTER — Other Ambulatory Visit: Payer: Self-pay | Admitting: Internal Medicine

## 2013-11-01 ENCOUNTER — Telehealth: Payer: Self-pay | Admitting: *Deleted

## 2013-11-01 NOTE — Telephone Encounter (Signed)
Contacted pt regarding physical therapy referral.  Per pt's request, he was referred to [facility], but they were unable to accept pt's insurance.  Pt's dtr wanted a place where father could receive PT in addition to massage therapy.  Massage therapy not avail at [facility], but I will forward referral to their workqueue.Despina Hidden Cassady2/12/201510:46 AM

## 2013-11-08 ENCOUNTER — Other Ambulatory Visit: Payer: Self-pay | Admitting: Internal Medicine

## 2013-11-12 ENCOUNTER — Ambulatory Visit (INDEPENDENT_AMBULATORY_CARE_PROVIDER_SITE_OTHER): Payer: Medicare HMO | Admitting: Pharmacist

## 2013-11-12 DIAGNOSIS — I2699 Other pulmonary embolism without acute cor pulmonale: Secondary | ICD-10-CM

## 2013-11-12 DIAGNOSIS — Z86711 Personal history of pulmonary embolism: Secondary | ICD-10-CM

## 2013-11-12 DIAGNOSIS — Z7901 Long term (current) use of anticoagulants: Secondary | ICD-10-CM

## 2013-11-12 DIAGNOSIS — Z7902 Long term (current) use of antithrombotics/antiplatelets: Secondary | ICD-10-CM

## 2013-11-12 LAB — POCT INR: INR: 2.7

## 2013-11-12 NOTE — Progress Notes (Signed)
Anti-Coagulation Progress Note  Tejon Gracie is a 80 y.o. male who is currently on an anti-coagulation regimen.    RECENT RESULTS: Recent results are below, the most recent result is correlated with a dose of 35 mg. per week: Lab Results  Component Value Date   INR 2.70 11/12/2013   INR 2.40 10/15/2013   INR 2.50 09/10/2013    ANTI-COAG DOSE: Anticoagulation Dose Instructions as of 11/12/2013     [name] Tue Wed Thu Fri Sat   New Dose 5 mg 5 mg 5 mg 5 mg 5 mg 5 mg 5 mg       ANTICOAG SUMMARY: Anticoagulation Episode Summary   Current INR goal 2.0-3.0  Next INR check 12/10/2013  INR from last check 2.70 (11/12/2013)  Weekly max dose   Target end date Indefinite  [facility] check [facility]  Preferred lab   Send INR reminders to [facility]   Indications  Pulmonary embolism [415.19] Encounter for long-term use of antiplatelets/antithrombotics [V58.63]        Comments History of multiple venous embolic episodes. Will continue to annual re-evaluate continued need for warfarin weighing risks vs. benefits.       Anticoagulation Care Providers   Provider Role Specialty Phone number   Axel Filler, MD  Internal Medicine [phone]      ANTICOAG TODAY: Anticoagulation Summary as of 11/12/2013   INR goal 2.0-3.0  Selected INR 2.70 (11/12/2013)  Next INR check 12/10/2013  Target end date Indefinite   Indications  Pulmonary embolism [415.19] Encounter for long-term use of antiplatelets/antithrombotics [V58.63]      Anticoagulation Episode Summary   [facility] check [facility]   Preferred lab    Send INR reminders to [facility]   Comments History of multiple venous embolic episodes. Will continue to annual re-evaluate continued need for warfarin weighing risks vs. benefits.     Anticoagulation Care Providers   Provider Role Specialty Phone number   Axel Filler, MD  Internal Medicine [phone]      PATIENT INSTRUCTIONS: Patient  Instructions  Patient instructed to take medications as defined in the Anti-coagulation Track section of this encounter.  Patient instructed to take today's dose.  Patient verbalized understanding of these instructions.       FOLLOW-UP Return in 4 weeks (on 12/10/2013) for Follow up INR at 2:45PM.  Jorene Guest, [name] Pharm.D., CACP

## 2013-11-12 NOTE — Patient Instructions (Signed)
Patient instructed to take medications as defined in the Anti-coagulation Track section of this encounter.  Patient instructed to take today's dose.  Patient verbalized understanding of these instructions.    

## 2013-12-10 ENCOUNTER — Ambulatory Visit (INDEPENDENT_AMBULATORY_CARE_PROVIDER_SITE_OTHER): Payer: Medicare HMO | Admitting: Pharmacist

## 2013-12-10 DIAGNOSIS — Z7902 Long term (current) use of antithrombotics/antiplatelets: Secondary | ICD-10-CM

## 2013-12-10 DIAGNOSIS — I2699 Other pulmonary embolism without acute cor pulmonale: Secondary | ICD-10-CM

## 2013-12-10 LAB — POCT INR: INR: 3.4

## 2013-12-10 NOTE — Patient Instructions (Signed)
Patient was instructed to hold today's dose. Patient instructions as printed on [email or web address] directions provided to patient. Patient verbalized understanding of all instructions.

## 2013-12-18 ENCOUNTER — Other Ambulatory Visit: Payer: Self-pay | Admitting: Internal Medicine

## 2013-12-24 ENCOUNTER — Ambulatory Visit: Payer: Medicare HMO

## 2013-12-31 ENCOUNTER — Ambulatory Visit (INDEPENDENT_AMBULATORY_CARE_PROVIDER_SITE_OTHER): Payer: Medicare HMO | Admitting: Pharmacist

## 2013-12-31 DIAGNOSIS — I2699 Other pulmonary embolism without acute cor pulmonale: Secondary | ICD-10-CM

## 2013-12-31 DIAGNOSIS — Z7902 Long term (current) use of antithrombotics/antiplatelets: Secondary | ICD-10-CM

## 2013-12-31 LAB — POCT INR: INR: 1.8

## 2013-12-31 NOTE — Progress Notes (Signed)
Anti-Coagulation Progress Note  [name] [name] is a 80 y.o. male who is currently on an anti-coagulation regimen.    RECENT RESULTS: Recent results are below, the most recent result is correlated with a dose of 30 mg. per week: Lab Results  Component Value Date   INR 1.80 12/31/2013   INR 3.4 12/10/2013   INR 2.70 11/12/2013    ANTI-COAG DOSE: Anticoagulation Dose Instructions as of 12/31/2013     [name] Tue Wed Thu Fri Sat   New Dose 5 mg 5 mg 5 mg 5 mg 5 mg 5 mg 5 mg       ANTICOAG SUMMARY: Anticoagulation Episode Summary   Current INR goal 2.0-3.0  Next INR check 01/28/2014  INR from last check 1.80! (12/31/2013)  Weekly max dose   Target end date Indefinite  [facility]  Preferred lab   Send INR reminders to [facility]   Indications  Pulmonary embolism [415.19] Encounter for long-term use of antiplatelets/antithrombotics [V58.63]        Comments History of multiple venous embolic episodes. Will continue to annual re-evaluate continued need for warfarin weighing risks vs. benefits.       Anticoagulation Care Providers   Provider Role Specialty Phone number   Axel Filler, MD  Internal Medicine [phone]      ANTICOAG TODAY: Anticoagulation Summary as of 12/31/2013   INR goal 2.0-3.0  Selected INR 1.80! (12/31/2013)  Next INR check 01/28/2014  Target end date Indefinite   Indications  Pulmonary embolism [415.19] Encounter for long-term use of antiplatelets/antithrombotics [V58.63]      Anticoagulation Episode Summary   [facility] check [facility]   Preferred lab    Send INR reminders to [facility]   Comments History of multiple venous embolic episodes. Will continue to annual re-evaluate continued need for warfarin weighing risks vs. benefits.     Anticoagulation Care Providers   Provider Role Specialty Phone number   Axel Filler, MD  Internal Medicine [phone]      PATIENT INSTRUCTIONS: Patient  Instructions  Patient instructed to take medications as defined in the Anti-coagulation Track section of this encounter.  Patient instructed to take today's dose.  Patient verbalized understanding of these instructions.       FOLLOW-UP Return in 4 weeks (on 01/28/2014) for Follow up INR at 2:15PM.  Jorene Guest, [name] Pharm.D., CACP

## 2013-12-31 NOTE — Patient Instructions (Signed)
Patient instructed to take medications as defined in the Anti-coagulation Track section of this encounter.  Patient instructed to take today's dose.  Patient verbalized understanding of these instructions.    

## 2013-12-31 NOTE — Progress Notes (Signed)
Indication: Recurrent venous thrombosis. Duration: Lifelong. INR: Below target. Agree with Dr. Groce's assessment and plan. 

## 2014-01-05 ENCOUNTER — Other Ambulatory Visit: Payer: Self-pay | Admitting: Internal Medicine

## 2014-01-21 ENCOUNTER — Other Ambulatory Visit: Payer: Self-pay | Admitting: Internal Medicine

## 2014-01-21 ENCOUNTER — Ambulatory Visit: Payer: Medicare Other | Admitting: Nurse Practitioner

## 2014-01-23 ENCOUNTER — Encounter (INDEPENDENT_AMBULATORY_CARE_PROVIDER_SITE_OTHER): Payer: Self-pay

## 2014-01-23 ENCOUNTER — Ambulatory Visit (INDEPENDENT_AMBULATORY_CARE_PROVIDER_SITE_OTHER): Payer: Commercial Managed Care - HMO | Admitting: Nurse Practitioner

## 2014-01-23 ENCOUNTER — Encounter: Payer: Self-pay | Admitting: Nurse Practitioner

## 2014-01-23 VITALS — BP 128/82 | HR 71 | Wt 161.0 lb

## 2014-01-23 DIAGNOSIS — G309 Alzheimer's disease, unspecified: Principal | ICD-10-CM

## 2014-01-23 DIAGNOSIS — F0280 Dementia in other diseases classified elsewhere without behavioral disturbance: Secondary | ICD-10-CM

## 2014-01-23 DIAGNOSIS — I6789 Other cerebrovascular disease: Secondary | ICD-10-CM

## 2014-01-23 DIAGNOSIS — G20 Parkinson's disease: Secondary | ICD-10-CM

## 2014-01-23 DIAGNOSIS — G20A1 Parkinson's disease without dyskinesia, without mention of fluctuations: Secondary | ICD-10-CM

## 2014-01-23 MED ORDER — NAMENDA XR 28 MG PO CP24: 1.0000 | ORAL_CAPSULE | Freq: Every day | ORAL | Status: DC

## 2014-01-23 MED ORDER — NORTRIPTYLINE HCL 10 MG PO CAPS: 10.0000 mg | ORAL_CAPSULE | Freq: Every day | ORAL | Status: DC

## 2014-01-23 NOTE — Patient Instructions (Signed)
Start Nortriptyline 10 mg at bedtime.  Side effects to watch out for are increased confusion, daytime sleepiness. Continue Zoloft but change to giving it in the morning.  Continue Namenda, when your supply runs out, refill the Rx for Namenda XR.  Please call the cardiologist office and ask if they want to check his coumadin more frequently while on nail fungus medicine.  Follow up in 6 months, sooner as needed.

## 2014-01-23 NOTE — Progress Notes (Signed)
[name] DOB: 01-29-1931  REASON FOR VISIT: routine follow up for Alzheimer's and Parkinson's HISTORY FROM: patient  HISTORY OF PRESENT ILLNESS: UPDATE 01/23/14 (LL):  MMSE today is 23/30, AFT 9 and Clock drawing 1/4. He is tolerating Sinemet tid and Namenda well.  Mood is better on Zoloft. Having difficulty sleeping.  Does not nap during the day.  Has difficulty getting to sleep and wakes frequently.  Has tried Melatonin in the past without benefit.  He used Nortriptyline in the past for insomnia and pain, but at 100 mg dose became toxic (unresponsive).  Daughter notices "sundowning" at night, more confused in nighttime hours.   UPDATE 06/23/13 (LL): [name] [name] comes back for 2 month revisit. Last visit he was started on Sinemet to see if he would have any benefit. Both he and his daughter think that he is moving better since on it. Daughter states that he is no longer "freezing" when he tries to take a step. He is currently taking it twice a day, once when he gets up (around 7-8) and then again at 5 pm. He has no new complaints. MMSE today is 27/30, AFT 8 and Clock drawing 1/4.   UPDATE 05/24/13 (LL): Patient returns for followup. After starting Aricept at last visit daughter reports that he became very weak, confused, and had to be hospitalized. Was found to be bradycardic and symptomatic with heart rate in the 40s, attributable to the Aricept. Medication was stopped and patient was discharged to rehabilitation for short time and has since returned home. Daughter states that he has adjusted somewhat to having moved in with her, is taking Zoloft for depression, and has disinterest in getting out of the house much. States that sometimes it is hard for him to pick up his foot to take a step and it seems to be when he is nervous. Also seems to have tremor when nervous. Has had trouble with insomnia was started on melatonin 5 mg at bedtime with no response. Daughter feels like he has  improved some in his cognitive ability since last visit. MMSE last visit was 23/30. MMSE today is 25/30 with deficits in recall. Clock drawing 0/4. AFT 10. Geriatric depression score is 3 does not suggest depression.   PRIOR HPI 02/05/13 (PS): 51 male with remote right hemispheric TIA in May 2011 and mild cognitive impairment. He returns today for followup after his last visit with me on 01/05/2012. Continues to do well from a neurovascular standpoint and has not had a any further stroke or TIAs. He remains on warfarin and is tolerating it well without significant bleeding, bruising or the side effects. He states his blood pressure is under good control and it is 124/76 in office today. He however is had increasing confusion and memory difficulties for the last 1 year which have been progressive. He now needs help with taking his medications. He has stopped going out. He in fact wandered out a few weeks ago from his home and needed help to get back. He denies any agitation or caval changes. He has no significant hallucination or other lesions. His gait and balance are fine and there have been no safety issues. He has now moved and to live with his daughter for the last 2-3 months. The patient did undergo labs for treatable causes of cognitive impairment at last visit and vitamin B12, TSH and RPR were normal on 01/05/2012. His mother did have Alzheimer's.   REVIEW OF SYSTEMS: Full 14 system review  of systems performed and notable only for:  Ringing in ears, Insomnia, Confusion   ALLERGIES: Allergies  Allergen Reactions  . Aricept [Donepezil Hcl] Other (See Comments)    Symptomatic Bradycardia.  . Other Other (See Comments)    Shrimp gives him gout    HOME MEDICATIONS: Outpatient Prescriptions Prior to Visit  Medication Sig Dispense Refill  . acetaminophen (TYLENOL) 500 MG tablet Take 2 tablets (1,000 mg total) by mouth 3 (three) times daily.  100 tablet  2  . amLODipine (NORVASC) 10 MG tablet Take  10 mg by mouth daily.      [name] atorvastatin (LIPITOR) 40 MG tablet TAKE 1 TABLET BY MOUTH DAILY  30 tablet  3  . b complex vitamins tablet Take 1 tablet by mouth daily.      . Calcium Carbonate-Vitamin D (CALCIUM-VITAMIN D) 500-200 MG-UNIT per tablet Take 1 tablet by mouth daily.      . carbidopa-levodopa (SINEMET) 25-100 MG per tablet Take 1 tablet by mouth 3 (three) times daily. Start 1/2 tablet 30 minutes before meals, after 1 week increase to whole tablet.  270 tablet  2  . diclofenac sodium (VOLTAREN) 1 % GEL Apply 2 g topically 4 (four) times daily.  100 g  11  . fish oil-omega-3 fatty acids 1000 MG capsule Take 2 g by mouth daily. 2 tabs daily      . ibuprofen (ADVIL,MOTRIN) 200 MG tablet Take 200 mg by mouth 2 (two) times daily as needed for pain. For pain      . lisinopril (PRINIVIL,ZESTRIL) 40 MG tablet TAKE 1 TABLET BY MOUTH DAILY  30 tablet  2  . Multiple Vitamins-Minerals (MULTIVITAMIN WITH MINERALS) tablet Take 1 tablet by mouth daily.      [name] omeprazole (PRILOSEC) 20 MG capsule Take 20 mg by mouth daily.      . sertraline (ZOLOFT) 25 MG tablet TAKE 1 TABLET (25 MG TOTAL) BY MOUTH DAILY.  30 tablet  1  . warfarin (COUMADIN) 5 MG tablet TAKE 1 AND 1/2 TABLET BY MOUTH ON MON WED AND FRI WHILE TAKING 1 ON OTHER DAYS PER CLINIC  34 tablet  1  . Cholecalciferol (VITAMIN D3) 2000 UNITS TABS Take 2,000 Units by mouth daily.       . memantine (NAMENDA) 10 MG tablet Take 1 tablet (10 mg total) by mouth 2 (two) times daily.  60 tablet  5  . Memantine HCl ER 28 MG CP24 Take 28 mg by mouth daily.  30 capsule  5   No facility-administered medications prior to visit.   PHYSICAL EXAM  Filed Vitals:   01/23/14 1502  BP: 128/82  Pulse: 71  Weight: 161 lb (73.029 kg)   Body mass index is 25.66 kg/(m^2).  Generalized: In no acute distress, pleasant elderly AA male, well groomed, well developed  Neck: Supple, no carotid bruits  Cardiac: Irregular rate and rhythm, soft 1/6 murmur  Pulmonary:  Clear to auscultation bilaterally  Musculoskeletal: mild kyphosis   Neurological examination  MMSE history: Today :   MMSE 23/30, recall 1/3, AFT 9 and Clock Drawing 1/4. 06/23/13:  MMSE 27/30, recall 3/3, AFT 8 and Clock drawing 1/4. GDS 5. 02/05/13:  MMSE 25/30, recall 1/3, AFT 10, Clock drawing 0/4. GDS 3.  Mentation: Alert, language fluent.  Mood and affect appropriate. MASKED FACIES.   Cranial nerve II-XII: Pupils were equal round reactive to light extraocular movements were full, visual field were full on confrontational test. facial sensation and strength were normal. hearing  was intact to finger rubbing bilaterally. Uvula tongue midline. head turning and shoulder shrug and were normal and symmetric.  MOTOR: normal bulk and tone, full strength in the BUE, BLE, fine finger movements normal, no pronator drift. MILD COGWHEEL RIGIDIITY IN ARMS AND WRISTS, L>R.  SENSORY: normal and symmetric to light touch  COORDINATION: Normal finger-nose-finger, heel-to-shin bilaterally.  REFLEXES: 1+ and symmetric. POSITIVE MYERSON'S, POSITIVE SNOUT, NEGATIVE PALMOMENTAL.  GAIT/STATION: Rising up from seated position without assistance, STOOPED STANCE, without trunk ataxia, moderate stride, POOR ARM SWING ON RIGHT, TURNS IN 3 STEPS, UNABLE to perform tiptoe, and heel walking without difficulty. Romberg negative.  ASSESSMENT AND PLAN 80 year old AA male patient with mild cognitive impairment which appears to progressed to mild dementia. Likely Alzheimer`s type. Remote history of right brain TIA in May 2011 and stable from a neurovascular standpoint. Aricept caused bradycardia. Moving easier and more steady on Sinemet.   PLAN:  I had a long discussion with the patient and family regarding his progressive memory decline and discussed treatment options.  Continue Sinemet for Parkinsonism 3 times daily.  Continue Namenda, Rx given for Namenda XR. Start Nortriptyline 10 mg at bedtime for insomnia.  Do not plan  to increase.  He had taken this medication in the past for insomnia and pain but at a much higher dose-50 mg. Continue Zoloft but change to giving it in the morning. Continue warfarin for stroke prevention admitted strict control of hypertension with blood pressure goal below 130/90.  Increase participation and cognitively challenging activities, try to get daily exercise. Return for followup in 6 months, sooner as needed.   Meds ordered this encounter  Medications  . nortriptyline (PAMELOR) 10 MG capsule    Sig: Take 1 capsule (10 mg total) by mouth at bedtime.    Dispense:  90 capsule    Refill:  3    Order Specific Question:  Supervising Provider    Answer:  Andrey Spearman R [[id number]]  . NAMENDA XR 28 MG CP24    Sig: Take 28 mg by mouth daily.    Dispense:  30 capsule    Refill:  12    Order Specific Question:  Supervising Provider    Answer:  Penni Bombard [[id number]]   Return in about 6 months (around 07/26/2014).  Philmore Pali, MSN, NP-C 01/23/2014, 3:58 PM [facility] [address] [place], [place] [address] [phone]  Note: This document was prepared with digital dictation and possible smart phrase technology. Any transcriptional errors that result from this process are unintentional.

## 2014-01-24 ENCOUNTER — Telehealth: Payer: Self-pay | Admitting: *Deleted

## 2014-01-24 DIAGNOSIS — Z7902 Long term (current) use of antithrombotics/antiplatelets: Secondary | ICD-10-CM

## 2014-01-24 NOTE — Telephone Encounter (Signed)
Spoke with patient's daughter - patient has been on terbinafine for 1 month - I asked that she hold today's dose, and have INR checked tomorrow - they also have appt with Dr. Elie Confer on Monday the 11th.  She voiced understanding, will come at 1:15p tomorrow (5/8)

## 2014-01-24 NOTE — Telephone Encounter (Signed)
Call from pt's family member - stated pt has been prescribed Terbinafine HCL 250mg  for toenail fungus; wants to make sure it's ok to take w/Warfarin. Thanks

## 2014-01-25 ENCOUNTER — Other Ambulatory Visit: Payer: Self-pay | Admitting: Internal Medicine

## 2014-01-25 ENCOUNTER — Other Ambulatory Visit (INDEPENDENT_AMBULATORY_CARE_PROVIDER_SITE_OTHER): Payer: Medicare HMO

## 2014-01-25 DIAGNOSIS — I2699 Other pulmonary embolism without acute cor pulmonale: Secondary | ICD-10-CM

## 2014-01-25 DIAGNOSIS — Z7902 Long term (current) use of antithrombotics/antiplatelets: Secondary | ICD-10-CM

## 2014-01-25 LAB — POCT INR: INR: 1.9

## 2014-01-25 MED ORDER — WARFARIN SODIUM 5 MG PO TABS: 5.0000 mg | ORAL_TABLET | Freq: Every day | ORAL | Status: DC

## 2014-01-25 NOTE — Assessment & Plan Note (Signed)
Patient presented for INR check after being on terbinafine x 1 month - INR 1.9 - had been taking coumadin 1mg , 1 tab everyday except on Friday take 0.5 tab.  Asked patient to increase to 1 tab daily (5mg  everyday, today's dose to be given).  Patient may continue terbinafine. Patient to see Dr. Elie Confer on Monday 01/28/14.

## 2014-01-25 NOTE — Progress Notes (Signed)
Patient presented for INR check after being on terbinafine x 1 month - INR 1.9 - had been taking coumadin 1mg , 1 tab everyday except on Friday take 0.5 tab.  Asked patient to increase to 1 tab daily (5mg  everyday, today's dose to be given).  Patient may continue terbinafine. Patient to see Dr. Elie Confer on Monday 01/28/14.  Fransisca Kaufmann, MD ([facility], PGY3)

## 2014-01-28 ENCOUNTER — Ambulatory Visit (INDEPENDENT_AMBULATORY_CARE_PROVIDER_SITE_OTHER): Payer: Medicare HMO | Admitting: Pharmacist

## 2014-01-28 DIAGNOSIS — Z7902 Long term (current) use of antithrombotics/antiplatelets: Secondary | ICD-10-CM

## 2014-01-28 DIAGNOSIS — I2699 Other pulmonary embolism without acute cor pulmonale: Secondary | ICD-10-CM

## 2014-01-28 LAB — POCT INR: INR: 1.8

## 2014-01-28 NOTE — Progress Notes (Signed)
Anti-Coagulation Progress Note  [name] [name] is a 80 y.o. male who is currently on an anti-coagulation regimen.    RECENT RESULTS: Recent results are below, the most recent result is correlated with a dose of 35 mg. per week: Lab Results  Component Value Date   INR 1.80 01/28/2014   INR 1.9 01/25/2014   INR 1.80 12/31/2013    ANTI-COAG DOSE: Anticoagulation Dose Instructions as of 01/28/2014     [name] Tue Wed Thu Fri Sat   New Dose 5 mg 7.5 mg 5 mg 5 mg 5 mg 5 mg 5 mg       ANTICOAG SUMMARY: Anticoagulation Episode Summary   Current INR goal 2.0-3.0  Next INR check 02/06/2014  INR from last check 1.80! (01/28/2014)  Weekly max dose   Target end date Indefinite  [facility]  Preferred lab   Send INR reminders to [facility]   Indications  Pulmonary embolism [415.19] Encounter for long-term use of antiplatelets/antithrombotics [V58.63]        Comments History of multiple venous embolic episodes. Will continue to annual re-evaluate continued need for warfarin weighing risks vs. benefits.       Anticoagulation Care Providers   Provider Role Specialty Phone number   Axel Filler, MD  Internal Medicine [phone]      ANTICOAG TODAY: Anticoagulation Summary as of 01/28/2014   INR goal 2.0-3.0  Selected INR 1.80! (01/28/2014)  Next INR check 02/06/2014  Target end date Indefinite   Indications  Pulmonary embolism [415.19] Encounter for long-term use of antiplatelets/antithrombotics [V58.63]      Anticoagulation Episode Summary   [facility] check [facility]   Preferred lab    Send INR reminders to [facility]   Comments History of multiple venous embolic episodes. Will continue to annual re-evaluate continued need for warfarin weighing risks vs. benefits.     Anticoagulation Care Providers   Provider Role Specialty Phone number   Axel Filler, MD  Internal Medicine [phone]      PATIENT INSTRUCTIONS: Patient  Instructions  Patient instructed to take medications as defined in the Anti-coagulation Track section of this encounter.  Patient instructed to take today's dose.  Patient verbalized understanding of these instructions.       FOLLOW-UP Return in 9 days (on 02/06/2014) for Follow up INR at [facility], [name] Pharm.D., CACP

## 2014-01-28 NOTE — Patient Instructions (Signed)
Patient instructed to take medications as defined in the Anti-coagulation Track section of this encounter.  Patient instructed to take today's dose.  Patient verbalized understanding of these instructions.    

## 2014-01-31 ENCOUNTER — Encounter (HOSPITAL_COMMUNITY): Payer: Self-pay | Admitting: Emergency Medicine

## 2014-01-31 ENCOUNTER — Emergency Department (HOSPITAL_COMMUNITY): Payer: Medicare HMO

## 2014-01-31 ENCOUNTER — Emergency Department (HOSPITAL_COMMUNITY)
Admission: EM | Admit: 2014-01-31 | Discharge: 2014-01-31 | Disposition: A | Discharge status: Home / Self Care | Payer: Medicare HMO | Attending: Emergency Medicine | Admitting: Emergency Medicine

## 2014-01-31 ENCOUNTER — Emergency Department (INDEPENDENT_AMBULATORY_CARE_PROVIDER_SITE_OTHER)
Admission: EM | Admit: 2014-01-31 | Discharge: 2014-01-31 | Disposition: A | Discharge status: Other Acute Inpatient Hospital | Payer: Medicare HMO | Source: Home / Self Care | Attending: Family Medicine | Admitting: Family Medicine

## 2014-01-31 DIAGNOSIS — F0390 Unspecified dementia without behavioral disturbance: Secondary | ICD-10-CM | POA: Insufficient documentation

## 2014-01-31 DIAGNOSIS — R079 Chest pain, unspecified: Secondary | ICD-10-CM

## 2014-01-31 DIAGNOSIS — R5383 Other fatigue: Secondary | ICD-10-CM

## 2014-01-31 DIAGNOSIS — R61 Generalized hyperhidrosis: Secondary | ICD-10-CM | POA: Insufficient documentation

## 2014-01-31 DIAGNOSIS — E785 Hyperlipidemia, unspecified: Secondary | ICD-10-CM | POA: Insufficient documentation

## 2014-01-31 DIAGNOSIS — I10 Essential (primary) hypertension: Secondary | ICD-10-CM

## 2014-01-31 DIAGNOSIS — I2510 Atherosclerotic heart disease of native coronary artery without angina pectoris: Secondary | ICD-10-CM | POA: Insufficient documentation

## 2014-01-31 DIAGNOSIS — Z86711 Personal history of pulmonary embolism: Secondary | ICD-10-CM | POA: Insufficient documentation

## 2014-01-31 DIAGNOSIS — F3289 Other specified depressive episodes: Secondary | ICD-10-CM | POA: Insufficient documentation

## 2014-01-31 DIAGNOSIS — Z8546 Personal history of malignant neoplasm of prostate: Secondary | ICD-10-CM | POA: Insufficient documentation

## 2014-01-31 DIAGNOSIS — Z79899 Other long term (current) drug therapy: Secondary | ICD-10-CM | POA: Insufficient documentation

## 2014-01-31 DIAGNOSIS — Z8739 Personal history of other diseases of the musculoskeletal system and connective tissue: Secondary | ICD-10-CM | POA: Insufficient documentation

## 2014-01-31 DIAGNOSIS — R110 Nausea: Secondary | ICD-10-CM | POA: Insufficient documentation

## 2014-01-31 DIAGNOSIS — F329 Major depressive disorder, single episode, unspecified: Secondary | ICD-10-CM | POA: Insufficient documentation

## 2014-01-31 DIAGNOSIS — R5381 Other malaise: Secondary | ICD-10-CM | POA: Insufficient documentation

## 2014-01-31 DIAGNOSIS — Z87828 Personal history of other (healed) physical injury and trauma: Secondary | ICD-10-CM | POA: Insufficient documentation

## 2014-01-31 DIAGNOSIS — Z7901 Long term (current) use of anticoagulants: Secondary | ICD-10-CM | POA: Insufficient documentation

## 2014-01-31 DIAGNOSIS — R42 Dizziness and giddiness: Secondary | ICD-10-CM | POA: Insufficient documentation

## 2014-01-31 DIAGNOSIS — R0602 Shortness of breath: Secondary | ICD-10-CM | POA: Insufficient documentation

## 2014-01-31 LAB — COMPREHENSIVE METABOLIC PANEL
ALK PHOS: 54 U/L (ref 39–117)
ALT: 36 U/L (ref 0–53)
AST: 37 U/L (ref 0–37)
Albumin: 3.8 g/dL (ref 3.5–5.2)
BUN: 9 mg/dL (ref 6–23)
CHLORIDE: 102 meq/L (ref 96–112)
CO2: 25 mEq/L (ref 19–32)
Calcium: 9.6 mg/dL (ref 8.4–10.5)
Creatinine, Ser: 0.99 mg/dL (ref 0.50–1.35)
GFR calc non Af Amer: 75 mL/min — ABNORMAL LOW (ref >90)
GFR, EST AFRICAN AMERICAN: 87 mL/min — AB (ref >90)
GLUCOSE: 81 mg/dL (ref 70–99)
POTASSIUM: 4.4 meq/L (ref 3.7–5.3)
Sodium: 139 mEq/L (ref 137–147)
Total Bilirubin: 0.3 mg/dL (ref 0.3–1.2)
Total Protein: 7.7 g/dL (ref 6.0–8.3)

## 2014-01-31 LAB — CBC
HCT: 38.7 % — ABNORMAL LOW (ref 39.0–52.0)
Hemoglobin: 12.5 g/dL — ABNORMAL LOW (ref 13.0–17.0)
MCH: 30.5 pg (ref 26.0–34.0)
MCHC: 32.3 g/dL (ref 30.0–36.0)
MCV: 94.4 fL (ref 78.0–100.0)
PLATELETS: 160 10*3/uL (ref 150–400)
RBC: 4.1 MIL/uL — ABNORMAL LOW (ref 4.22–5.81)
RDW: 15.4 % (ref 11.5–15.5)
WBC: 2.6 10*3/uL — AB (ref 4.0–10.5)

## 2014-01-31 LAB — I-STAT TROPONIN, ED: Troponin i, poc: 0.01 ng/mL (ref 0.00–0.08)

## 2014-01-31 LAB — I-STAT CHEM 8, ED
BUN: 7 mg/dL (ref 6–23)
CHLORIDE: 103 meq/L (ref 96–112)
Calcium, Ion: 1.19 mmol/L (ref 1.13–1.30)
Creatinine, Ser: 1.1 mg/dL (ref 0.50–1.35)
Glucose, Bld: 81 mg/dL (ref 70–99)
HCT: 42 % (ref 39.0–52.0)
Hemoglobin: 14.3 g/dL (ref 13.0–17.0)
POTASSIUM: 4.2 meq/L (ref 3.7–5.3)
Sodium: 141 mEq/L (ref 137–147)
TCO2: 25 mmol/L (ref 0–100)

## 2014-01-31 LAB — PROTIME-INR
INR: 2.22 — ABNORMAL HIGH (ref 0.00–1.49)
Prothrombin Time: 23.9 seconds — ABNORMAL HIGH (ref 11.6–15.2)

## 2014-01-31 LAB — PRO B NATRIURETIC PEPTIDE: Pro B Natriuretic peptide (BNP): 41 pg/mL (ref 0–450)

## 2014-01-31 LAB — TROPONIN I: Troponin I: <0.3 ng/mL (ref <0.30)

## 2014-01-31 LAB — APTT: APTT: 34 s (ref 24–37)

## 2014-01-31 MED ORDER — SODIUM CHLORIDE 0.9 % IV SOLN
Freq: Once | INTRAVENOUS | Status: AC
  Administered 2014-01-31: 50 mL/h via INTRAVENOUS

## 2014-01-31 NOTE — ED Provider Notes (Signed)
CSN: [id number]     Arrival date & time 01/31/14  1410 History   None    Chief Complaint  Patient presents with  . Chest Pain  . Shortness of Breath     (Consider location/radiation/quality/duration/timing/severity/associated sxs/prior Treatment) HPI Pt is an 80yo male transferred to ED from Urgent Care for further evaluation of CP and SOB. Pt is accompanied by his daughter who states she was called by pt's home health nurse around 12PM this afternoon, pt was c/o bilateral chest pain, dizziness, and shortness of breath.  Pain lasted less than 5 minutes but is difficult for pt to describe, pt states "it was just pain."  Pt reports feeling increased fatigue and just "not well" over last 2 days.  Daughter's main concern was pt's INR as pt is on coumadin and recently placed on an antifungal medication that could alter INR levels.  Upon arrival to ED pt states he feels fine, denies chest pain, SOB, dizziness, nausea or diaphoresis.  Per daughter, no symptoms suggestive of stroke today, pt has been acting at baseline.  Reports pt has occasional chest discomfort due to previous rib injury and arthritis.  Denies new injuries. No pain medication given PTA. Denies fever, cough, congestion, sick contacts, or recent travel.   Past Medical History  Diagnosis Date  . PE (pulmonary embolism)     unprovoked PE completed 6 months of warfarin, warfarin d/ced 02/12/2010, repeat PE 07/10/2010 after a long car ride and now on lifelong coumadin.  [name] Hypertension   . Pituitary microadenoma     incidental finding CT 12/2009  . Prostate cancer   . Depression   . Hyperlipidemia   . Coronary artery disease, non-occlusive     with history of MI; Cath 2008 w multivessel nonobstructive CAD  . Dementia    Past Surgical History  Procedure Laterality Date  . Prostatectomy     Family History  Problem Relation Age of Onset  . Hypertension Father     Passed away from cerebral hemorrhage at age of 82.  [name] Dementia Mother      Passed away  . Hypertension Child     4 adult children  . Cervical cancer Other   . Lung cancer Other   . Stroke Other    History  Substance Use Topics  . Smoking status: Never Smoker   . Smokeless tobacco: Never Used  . Alcohol Use: No    Review of Systems  Constitutional: Positive for fatigue. Negative for fever, chills, diaphoresis and appetite change.  HENT: Negative for congestion.   Respiratory: Positive for shortness of breath. Negative for cough.   Cardiovascular: Positive for chest pain. Negative for palpitations and leg swelling.  Gastrointestinal: Negative for nausea, vomiting and diarrhea.  Neurological: Negative for weakness.  All other systems reviewed and are negative.     Allergies  Aricept and Other  Home Medications   Prior to Admission medications   Medication Sig Start Date End Date Taking? Authorizing Provider  acetaminophen (TYLENOL) 500 MG tablet Take 2 tablets (1,000 mg total) by mouth 3 (three) times daily. 09/24/13 09/24/14 Yes Neema Bobbie Stack, MD  amLODipine (NORVASC) 10 MG tablet Take 10 mg by mouth daily.   Yes Historical Provider, MD  atorvastatin (LIPITOR) 40 MG tablet Take 40 mg by mouth every evening.   Yes Historical Provider, MD  b complex vitamins tablet Take 1 tablet by mouth daily.   Yes Historical Provider, MD  Calcium Carbonate-Vitamin D (CALCIUM-VITAMIN D) 500-200 MG-UNIT per  tablet Take 1 tablet by mouth daily.   Yes Historical Provider, MD  carbidopa-levodopa (SINEMET IR) 25-100 MG per tablet Take 1 tablet by mouth 2 (two) times daily.   Yes Historical Provider, MD  fish oil-omega-3 fatty acids 1000 MG capsule Take 2 g by mouth daily.    Yes Historical Provider, MD  lisinopril (PRINIVIL,ZESTRIL) 40 MG tablet Take 40 mg by mouth daily.   Yes Historical Provider, MD  memantine (NAMENDA) 10 MG tablet Take 10 mg by mouth 2 (two) times daily.   Yes Historical Provider, MD  Multiple Vitamins-Minerals (MULTIVITAMIN WITH MINERALS) tablet  Take 1 tablet by mouth daily.   Yes Historical Provider, MD  nortriptyline (PAMELOR) 10 MG capsule Take 1 capsule (10 mg total) by mouth at bedtime. 01/23/14  Yes Philmore Pali, NP  omeprazole (PRILOSEC) 20 MG capsule Take 20 mg by mouth daily.   Yes Historical Provider, MD  sertraline (ZOLOFT) 25 MG tablet Take 25 mg by mouth daily.   Yes Historical Provider, MD  terbinafine (LAMISIL) 250 MG tablet Take 250 mg by mouth daily.   Yes Historical Provider, MD  warfarin (COUMADIN) 5 MG tablet Take 5-705 mg by mouth daily at 6 PM. On mondays take 1.5 tablets (7.5 mg) and on all other days take 1 tablet (5 mg)   Yes Historical Provider, MD  NAMENDA XR 28 MG CP24 Take 28 mg by mouth daily. 01/23/14   Philmore Pali, NP   BP 158/80  Pulse 70  Temp(Src) 97.5 F (36.4 C) (Oral)  Resp 16  SpO2 97% Physical Exam  Nursing note and vitals reviewed. Constitutional: He appears well-developed and well-nourished. No distress.  Pt lying comfortably in exam bed, NAD.  HENT:  Head: Normocephalic and atraumatic.  Eyes: Conjunctivae are normal. No scleral icterus.  Neck: Normal range of motion.  Cardiovascular: Normal rate, regular rhythm and normal heart sounds.   Regular rate and rhythm   Pulmonary/Chest: Effort normal and breath sounds normal. No respiratory distress. He has no wheezes. He has no rales. He exhibits no tenderness.  No respiratory distress, able to speak in full sentences w/o difficulty. Lungs: CTAB  Abdominal: Soft. Bowel sounds are normal. He exhibits no distension and no mass. There is no tenderness. There is no rebound and no guarding.  Musculoskeletal: Normal range of motion.  Neurological: He is alert.  Skin: Skin is warm and dry. He is not diaphoretic.    ED Course  Procedures (including critical care time) Labs Review Labs Reviewed  CBC - Abnormal; Notable for the following:    WBC 2.6 (*)    RBC 4.10 (*)    Hemoglobin 12.5 (*)    HCT 38.7 (*)    All other components within normal  limits  COMPREHENSIVE METABOLIC PANEL - Abnormal; Notable for the following:    GFR calc non Af Amer 75 (*)    GFR calc Af Amer 87 (*)    All other components within normal limits  PROTIME-INR - Abnormal; Notable for the following:    Prothrombin Time 23.9 (*)    INR 2.22 (*)    All other components within normal limits  APTT  PRO B NATRIURETIC PEPTIDE  TROPONIN I  I-STAT CHEM 8, ED  I-STAT TROPOININ, ED    Imaging Review Dg Chest 2 View  01/31/2014   CLINICAL DATA:  Chest pain and shortness of breath, dizzy  EXAM: CHEST  2 VIEW  COMPARISON:  DG CHEST 2 VIEW dated 03/14/2013  FINDINGS:  Heart size and vascular pattern are normal. Lungs are clear except for mild atelectasis or scarring at the left base. No pleural effusions.  IMPRESSION: No active cardiopulmonary disease.   Electronically Signed   By: Skipper Cliche M.D.   On: 01/31/2014 15:54     EKG Interpretation None      MDM   Final diagnoses:  Chest pain    Pt sent to ED from Urgent Care for further evaluation of CP and SOB. Upon arrival to ED, pt is asymptomatic.  Appears well, NAD.  EKG-unchanged.  INR-2.22, within therapeutic level of 2.0-3.0. Troponin-negative elevation.  Will get delta troponin at 6pm.  If no change, pt may be discharged home to f/u with PCP.  Pt discussed and also examined by Dr. Stevie Kern who agrees with plan.   Delta troponin-negative for elevation. Pt discharged home, advised to f/u with PCP and cardiologist. Return precautions provided. Pt verbalized understanding and agreement with tx plan.     Noland Fordyce, PA-C 02/01/14 [phone]

## 2014-01-31 NOTE — ED Notes (Signed)
[facility] presents with a 80 yo male from Urgent Care with CP and SOB.  Pt is not in any pain at this time; however, before daughter took him to Urgent Care, pt c/o CP and SOB with dizziness and slight nausea with headache, back ache and facial pain as reported by his caregiver to the daughter.  The caregiver called the daughter to advise her of her father's condition.  Daughter mentioned that patient is on an antifungal medication that can alter INR levels and daughter was concerned that this is what is going on with patient so she brought him to Urgent Care for treatment.

## 2014-01-31 NOTE — Discharge Instructions (Signed)
Chest Pain (Nonspecific) °It is often hard to give a specific diagnosis for the cause of chest pain. There is always a chance that your pain could be related to something serious, such as a heart attack or a blood clot in the lungs. You need to follow up with your caregiver for further evaluation. °CAUSES  °· Heartburn. °· Pneumonia or bronchitis. °· Anxiety or stress. °· Inflammation around your heart (pericarditis) or lung (pleuritis or pleurisy). °· A blood clot in the lung. °· A collapsed lung (pneumothorax). It can develop suddenly on its own (spontaneous pneumothorax) or from injury (trauma) to the chest. °· Shingles infection (herpes zoster virus). °The chest wall is composed of bones, muscles, and cartilage. Any of these can be the source of the pain. °· The bones can be bruised by injury. °· The muscles or cartilage can be strained by coughing or overwork. °· The cartilage can be affected by inflammation and become sore (costochondritis). °DIAGNOSIS  °Lab tests or other studies, such as X-rays, electrocardiography, stress testing, or cardiac imaging, may be needed to find the cause of your pain.  °TREATMENT  °· Treatment depends on what may be causing your chest pain. Treatment may include: °· Acid blockers for heartburn. °· Anti-inflammatory medicine. °· Pain medicine for inflammatory conditions. °· Antibiotics if an infection is present. °· You may be advised to change lifestyle habits. This includes stopping smoking and avoiding alcohol, caffeine, and chocolate. °· You may be advised to keep your head raised (elevated) when sleeping. This reduces the chance of acid going backward from your stomach into your esophagus. °· Most of the time, nonspecific chest pain will improve within 2 to 3 days with rest and mild pain medicine. °HOME CARE INSTRUCTIONS  °· If antibiotics were prescribed, take your antibiotics as directed. Finish them even if you start to feel better. °· For the next few days, avoid physical  activities that bring on chest pain. Continue physical activities as directed. °· Do not smoke. °· Avoid drinking alcohol. °· Only take over-the-counter or prescription medicine for pain, discomfort, or fever as directed by your caregiver. °· Follow your caregiver's suggestions for further testing if your chest pain does not go away. °· Keep any follow-up appointments you made. If you do not go to an appointment, you could develop lasting (chronic) problems with pain. If there is any problem keeping an appointment, you must call to reschedule. °SEEK MEDICAL CARE IF:  °· You think you are having problems from the medicine you are taking. Read your medicine instructions carefully. °· Your chest pain does not go away, even after treatment. °· You develop a rash with blisters on your chest. °SEEK IMMEDIATE MEDICAL CARE IF:  °· You have increased chest pain or pain that spreads to your arm, neck, jaw, back, or abdomen. °· You develop shortness of breath, an increasing cough, or you are coughing up blood. °· You have severe back or abdominal pain, feel nauseous, or vomit. °· You develop severe weakness, fainting, or chills. °· You have a fever. °THIS IS AN EMERGENCY. Do not wait to see if the pain will go away. Get medical help at once. Call your local emergency services (911 in [place] Do not drive yourself to the hospital. °MAKE SURE YOU:  °· Understand these instructions. °· Will watch your condition. °· Will get help right away if you are not doing well or get worse. °Document Released: 06/16/2005 Document Revised: 11/29/2011 Document Reviewed: 04/11/2008 °[employer]® Patient Information ©2014 [employer]   [employer]. ° °

## 2014-01-31 NOTE — ED Notes (Signed)
Per sitter who sits w pt wife, he has not felt good since yesterday. C/ o pain in chest, neck

## 2014-01-31 NOTE — ED Provider Notes (Signed)
CSN: [id number]     Arrival date & time 01/31/14  1255 History   First MD Initiated Contact with Patient 01/31/14 1322     No chief complaint on file.  (Consider location/radiation/quality/duration/timing/severity/associated sxs/prior Treatment) Patient is a 80 y.o. male presenting with chest pain. The history is provided by the patient and a relative.  Chest Pain Pain location:  L chest and R chest Pain quality: pressure   Pain radiates to:  Mid back Pain radiates to the back: yes   Pain severity:  Moderate Onset quality:  Gradual Duration:  2 days Timing:  Constant Progression:  Unchanged Relieved by:  None tried Worsened by:  Nothing tried Associated symptoms: back pain    Mylez Venable is a 80 y.o. male who presents to the Urgent Care with chest pain. He has been feeling bad for the past 2 days. He complains of pain in his chest. When ask where he holds both sides of his chest. When asked if he has nausea he states "I don't know". Patient's PMH significant for  with CAD, he has a history of MI and had a Cardiac Cath 2008, PE x 2, HTN. His BP is elevated today at 150/99. He is taking his BP medication. There is an in home care taker for the patient and his wife.   Past Medical History  Diagnosis Date  . PE (pulmonary embolism)     unprovoked PE completed 6 months of warfarin, warfarin d/ced 02/12/2010, repeat PE 07/10/2010 after a long car ride and now on lifelong coumadin.  [name] Hypertension   . Pituitary microadenoma     incidental finding CT 12/2009  . Prostate cancer   . Depression   . Hyperlipidemia   . Coronary artery disease, non-occlusive     with history of MI; Cath 2008 w multivessel nonobstructive CAD  . Dementia    Past Surgical History  Procedure Laterality Date  . Prostatectomy     Family History  Problem Relation Age of Onset  . Hypertension Father     Passed away from cerebral hemorrhage at age of 50.  [name] Dementia Mother     Passed away  . Hypertension  Child     4 adult children  . Cervical cancer Other   . Lung cancer Other   . Stroke Other    History  Substance Use Topics  . Smoking status: Never Smoker   . Smokeless tobacco: Not on file  . Alcohol Use: No    Review of Systems  Cardiovascular: Positive for chest pain.  Musculoskeletal: Positive for back pain.  Difficulty with obtaining ROS, when I ask patient a question he answers most questions with I don't know.   Allergies  Aricept and Other  Home Medications   Prior to Admission medications   Medication Sig Start Date End Date Taking? Authorizing Provider  acetaminophen (TYLENOL) 500 MG tablet Take 2 tablets (1,000 mg total) by mouth 3 (three) times daily. 09/24/13 09/24/14  Othella Boyer, MD  amLODipine (NORVASC) 10 MG tablet Take 10 mg by mouth daily.    Historical Provider, MD  atorvastatin (LIPITOR) 40 MG tablet TAKE 1 TABLET BY MOUTH DAILY    Neema Bobbie Stack, MD  b complex vitamins tablet Take 1 tablet by mouth daily.    Historical Provider, MD  Calcium Carbonate-Vitamin D (CALCIUM-VITAMIN D) 500-200 MG-UNIT per tablet Take 1 tablet by mouth daily.    Historical Provider, MD  carbidopa-levodopa (SINEMET) 25-100 MG per tablet Take 1 tablet  by mouth 3 (three) times daily. Start 1/2 tablet 30 minutes before meals, after 1 week increase to whole tablet. 07/24/13   Philmore Pali, NP  diclofenac sodium (VOLTAREN) 1 % GEL Apply 2 g topically 4 (four) times daily. 09/24/13   Neema Bobbie Stack, MD  fish oil-omega-3 fatty acids 1000 MG capsule Take 2 g by mouth daily. 2 tabs daily    Historical Provider, MD  ibuprofen (ADVIL,MOTRIN) 200 MG tablet Take 200 mg by mouth 2 (two) times daily as needed for pain. For pain    Historical Provider, MD  lisinopril (PRINIVIL,ZESTRIL) 40 MG tablet TAKE 1 TABLET BY MOUTH DAILY 01/21/14   Othella Boyer, MD  Multiple Vitamins-Minerals (MULTIVITAMIN WITH MINERALS) tablet Take 1 tablet by mouth daily.    Historical Provider, MD  NAMENDA XR 28 MG CP24 Take 28  mg by mouth daily. 01/23/14   Philmore Pali, NP  nortriptyline (PAMELOR) 10 MG capsule Take 1 capsule (10 mg total) by mouth at bedtime. 01/23/14   Philmore Pali, NP  omeprazole (PRILOSEC) 20 MG capsule Take 20 mg by mouth daily.    Historical Provider, MD  sertraline (ZOLOFT) 25 MG tablet TAKE 1 TABLET (25 MG TOTAL) BY MOUTH DAILY.    Othella Boyer, MD  warfarin (COUMADIN) 5 MG tablet Take 1 tablet (5 mg total) by mouth daily. 01/25/14   Neema Bobbie Stack, MD   BP 150/99  Pulse 80  Temp(Src) 97.8 F (36.6 C) (Oral)  Resp 16  SpO2 98% Physical Exam  Nursing note and vitals reviewed. Constitutional: He is oriented to person, place, and time. He appears well-developed and well-nourished. No distress.  HENT:  Head: Normocephalic and atraumatic.  Eyes: Conjunctivae and EOM are normal.  Neck: Trachea normal. Neck supple. No JVD present. Carotid bruit is not present.  Cardiovascular: Normal rate and regular rhythm.   Pulmonary/Chest: Effort normal. He has rales (left).  Unable to palpate the pain the pain is having with palpation of the chest wall.   Abdominal: Soft. Bowel sounds are normal. There is no tenderness.  Musculoskeletal: Normal range of motion. He exhibits no edema and no tenderness.  Neurological: He is alert and oriented to person, place, and time. No cranial nerve deficit.  Skin: Skin is warm and dry.  Psychiatric: He has a normal mood and affect. His behavior is normal.   EKG: Vent. Rate 71, Normal sinus rhythm, Right bundle branch block, Left anterior fascicular block, Septal infarct, Abnormal ECG.  Dr. Georgina Snell reviewed the EKG and in to see patient prior to DC.     ED Course  Procedures  IV NS @ KVO, EKG, Transfer MDM  80 y.o. male with chest pain and cardiac history. Will transfer to [facility] for further evaluation. Discussed with the patient's daughter and she agrees with plan of care.    [place], [place] 01/31/14 1343

## 2014-01-31 NOTE — ED Provider Notes (Signed)
Medical screening examination/treatment/procedure(s) were conducted as a shared visit with non-physician practitioner(s) and myself.  I personally evaluated the patient during the encounter.  Urgent Care ECG reviewed: NSR with RBBB LAFB no significant change from prior; no ED ECG obtained since Pt transferred pain free from [facility]  80 year old male baseline dementia history poor historian history also from family patient recalls vague spell of chest pain and cannot otherwise quantify or qualify but suspects it was about 5 minutes long sometime this morning asymptomatic since that time; anticipate repeat troponin approximately 6 PM for discharge if negative; family agrees with plan.    Babette Relic, MD 02/01/14 2147

## 2014-02-03 NOTE — ED Provider Notes (Signed)
Medical screening examination/treatment/procedure(s) were performed by a resident physician or non-physician practitioner and as the supervising physician I was immediately available for consultation/collaboration.  Lynne Leader, MD    Gregor Hams, MD 02/03/14 [phone]

## 2014-02-06 ENCOUNTER — Ambulatory Visit: Payer: Commercial Managed Care - HMO

## 2014-02-07 ENCOUNTER — Emergency Department (INDEPENDENT_AMBULATORY_CARE_PROVIDER_SITE_OTHER): Payer: Medicare HMO

## 2014-02-07 ENCOUNTER — Emergency Department (HOSPITAL_COMMUNITY)
Admission: EM | Admit: 2014-02-07 | Discharge: 2014-02-07 | Disposition: A | Discharge status: Home / Self Care | Payer: Commercial Managed Care - HMO | Source: Home / Self Care | Attending: Family Medicine | Admitting: Family Medicine

## 2014-02-07 ENCOUNTER — Encounter (HOSPITAL_COMMUNITY): Payer: Self-pay | Admitting: Emergency Medicine

## 2014-02-07 DIAGNOSIS — S7000XA Contusion of unspecified hip, initial encounter: Secondary | ICD-10-CM

## 2014-02-07 DIAGNOSIS — W010XXA Fall on same level from slipping, tripping and stumbling without subsequent striking against object, initial encounter: Secondary | ICD-10-CM

## 2014-02-07 NOTE — ED Provider Notes (Signed)
[name] [name] is a 80 y.o. male who presents to Urgent Care today for Fall. Patient tripped and fell today. He landed on his right side. Pain is moderate hip pain. He denies any radiating pain weakness or numbness. He denies any fevers or chills nausea vomiting or diarrhea. He feels well otherwise. Tylenol used for pain control. No head injury or loss of consciousness.    Past Medical History  Diagnosis Date  . PE (pulmonary embolism)     unprovoked PE completed 6 months of warfarin, warfarin d/ced 02/12/2010, repeat PE 07/10/2010 after a long car ride and now on lifelong coumadin.  [name] Hypertension   . Pituitary microadenoma     incidental finding CT 12/2009  . Prostate cancer   . Depression   . Hyperlipidemia   . Coronary artery disease, non-occlusive     with history of MI; Cath 2008 w multivessel nonobstructive CAD  . Dementia    History  Substance Use Topics  . Smoking status: Never Smoker   . Smokeless tobacco: Never Used  . Alcohol Use: No   ROS as above Medications: No current facility-administered medications for this encounter.   Current Outpatient Prescriptions  Medication Sig Dispense Refill  . acetaminophen (TYLENOL) 500 MG tablet Take 2 tablets (1,000 mg total) by mouth 3 (three) times daily.  100 tablet  2  . amLODipine (NORVASC) 10 MG tablet Take 10 mg by mouth daily.      [name] atorvastatin (LIPITOR) 40 MG tablet Take 40 mg by mouth every evening.      [name] b complex vitamins tablet Take 1 tablet by mouth daily.      . Calcium Carbonate-Vitamin D (CALCIUM-VITAMIN D) 500-200 MG-UNIT per tablet Take 1 tablet by mouth daily.      . carbidopa-levodopa (SINEMET IR) 25-100 MG per tablet Take 1 tablet by mouth 2 (two) times daily.      . fish oil-omega-3 fatty acids 1000 MG capsule Take 2 g by mouth daily.       [name] lisinopril (PRINIVIL,ZESTRIL) 40 MG tablet Take 40 mg by mouth daily.      . memantine (NAMENDA) 10 MG tablet Take 10 mg by mouth 2 (two) times daily.      .  Multiple Vitamins-Minerals (MULTIVITAMIN WITH MINERALS) tablet Take 1 tablet by mouth daily.      [name] NAMENDA XR 28 MG CP24 Take 28 mg by mouth daily.  30 capsule  12  . nortriptyline (PAMELOR) 10 MG capsule Take 1 capsule (10 mg total) by mouth at bedtime.  90 capsule  3  . omeprazole (PRILOSEC) 20 MG capsule Take 20 mg by mouth daily.      . sertraline (ZOLOFT) 25 MG tablet Take 25 mg by mouth daily.      [name] terbinafine (LAMISIL) 250 MG tablet Take 250 mg by mouth daily.      [name] warfarin (COUMADIN) 5 MG tablet Take 5-705 mg by mouth daily at 6 PM. On mondays take 1.5 tablets (7.5 mg) and on all other days take 1 tablet (5 mg)        Exam:  BP 139/90  Pulse 74  Temp(Src) 98.4 F (36.9 C) (Oral)  Resp 16  SpO2 98% Gen: Well NAD Right hip: Tender palpation lateral hip. Mild pain with range of motion. Intact but slow gait. Neuro: Alert not oriented. Normally conversant.  EXAM:  RIGHT HIP - COMPLETE 2+ VIEW  COMPARISON: None.  FINDINGS:  There is no evidence of hip fracture or  dislocation. Degenerative  disc disease changes with lower lumbar spine. There is minimal  hypertrophic spurring along the superior border of the acetabulum on  the right.  IMPRESSION:  No acute osseous abnormalities. Degenerative changes within the  right hip and lower lumbar spine    Assessment and Plan: 80 y.o. male with hip contusion status post mechanical fall. Tylenol or tramadol for pain control. Followup with primary care provider.  Discussed warning signs or symptoms. Please see discharge instructions. Patient expresses understanding.    Gregor Hams, MD 02/11/14 [phone]

## 2014-02-07 NOTE — Discharge Instructions (Signed)
Thank you for coming in today. Continue Tylenol for pain control. Use tramadol sparingly for severe pain. Followup with primary care Dr.  Irven Easterly A contusion is a deep bruise. Contusions are the result of an injury that caused bleeding under the skin. The contusion may turn blue, purple, or yellow. Minor injuries will give you a painless contusion, but more severe contusions may stay painful and swollen for a few weeks.  CAUSES  A contusion is usually caused by a blow, trauma, or direct force to an area of the body. SYMPTOMS   Swelling and redness of the injured area.  Bruising of the injured area.  Tenderness and soreness of the injured area.  Pain. DIAGNOSIS  The diagnosis can be made by taking a history and physical exam. An X-ray, CT scan, or MRI may be needed to determine if there were any associated injuries, such as fractures. TREATMENT  Specific treatment will depend on what area of the body was injured. In general, the best treatment for a contusion is resting, icing, elevating, and applying cold compresses to the injured area. Over-the-counter medicines may also be recommended for pain control. Ask your caregiver what the best treatment is for your contusion. HOME CARE INSTRUCTIONS   Put ice on the injured area.  Put ice in a plastic bag.  Place a towel between your skin and the bag.  Leave the ice on for 15-20 minutes, 03-04 times a day.  Only take over-the-counter or prescription medicines for pain, discomfort, or fever as directed by your caregiver. Your caregiver may recommend avoiding anti-inflammatory medicines (aspirin, ibuprofen, and naproxen) for 48 hours because these medicines may increase bruising.  Rest the injured area.  If possible, elevate the injured area to reduce swelling. SEEK IMMEDIATE MEDICAL CARE IF:   You have increased bruising or swelling.  You have pain that is getting worse.  Your swelling or pain is not relieved with medicines. MAKE  SURE YOU:   Understand these instructions.  Will watch your condition.  Will get help right away if you are not doing well or get worse. Document Released: 06/16/2005 Document Revised: 11/29/2011 Document Reviewed: 07/12/2011 [facility] Patient Information 2014 [place], [place].

## 2014-02-07 NOTE — ED Notes (Signed)
Pt c/o right hip pain onset yest night Reports he fell onto hard wood floor and bounced and hit a wooden table Slow gait; alert w/no signs of acute distress.

## 2014-02-13 ENCOUNTER — Encounter: Payer: Self-pay | Admitting: Internal Medicine

## 2014-02-13 ENCOUNTER — Ambulatory Visit (INDEPENDENT_AMBULATORY_CARE_PROVIDER_SITE_OTHER): Payer: Commercial Managed Care - HMO | Admitting: Internal Medicine

## 2014-02-13 VITALS — BP 127/81 | HR 69 | Temp 98.4°F | Wt 162.1 lb

## 2014-02-13 DIAGNOSIS — M25551 Pain in right hip: Secondary | ICD-10-CM

## 2014-02-13 DIAGNOSIS — M25559 Pain in unspecified hip: Secondary | ICD-10-CM

## 2014-02-13 LAB — CBC
HEMATOCRIT: 35.6 % — AB (ref 39.0–52.0)
HEMOGLOBIN: 11.7 g/dL — AB (ref 13.0–17.0)
MCH: 31 pg (ref 26.0–34.0)
MCHC: 32.9 g/dL (ref 30.0–36.0)
MCV: 94.4 fL (ref 78.0–100.0)
Platelets: 191 10*3/uL (ref 150–400)
RBC: 3.77 MIL/uL — AB (ref 4.22–5.81)
RDW: 15.3 % (ref 11.5–15.5)
WBC: 3.4 10*3/uL — ABNORMAL LOW (ref 4.0–10.5)

## 2014-02-13 LAB — PROTIME-INR
INR: 2.45 — ABNORMAL HIGH (ref <1.50)
PROTHROMBIN TIME: 25.8 s — AB (ref 11.6–15.2)

## 2014-02-13 MED ORDER — TRAMADOL HCL 50 MG PO TABS: 50.0000 mg | ORAL_TABLET | Freq: Four times a day (QID) | ORAL | Status: DC | PRN

## 2014-02-13 NOTE — Progress Notes (Signed)
Subjective:   Patient ID: [name] [name] male   DOB: 1930/09/24 80 y.o.   MRN: [id number]  Chief Complaint  Patient presents with  . Hip Pain    right hip pain s/p fall on 05/20 seen in ED on 5/21   HPI: [name] is a 80 y.o. man with h/o HTN, HLD, GERD, DJD, PE on anticoag and lumbosacral spondylosis who presents for ED follow up.  Fall on 02/06/14, got up to turn bathroom lights off, and tripped in the dark.  Persistent right hip pain.  For the pain he has been alternating tylenol and ibuprofen with a heating pad.  Pain is not improving but not worsening. Ibuprofen/tylenol helps to the point of 0/10 pain.  Pain is worse with walking.  Unclear if tramadol was helping.  No dizziness or lightheadedness.  He did hit his head, but no HA or AMS.  No focal weakness either.  PT scheduled for June 8th.     Review of Systems: Constitutional: Denies fever, chills, diaphoresis, appetite change and fatigue.  HEENT: Denies photophobia, eye pain, redness, hearing loss, ear pain, congestion, sore throat, rhinorrhea, sneezing, mouth sores, trouble swallowing, neck pain, neck stiffness and tinnitus.  Respiratory: Denies SOB, DOE, cough, chest tightness, and wheezing.  Cardiovascular: Denies chest pain, palpitations and leg swelling.  Gastrointestinal: Denies nausea, vomiting, abdominal pain, diarrhea, constipation,blood in stool and abdominal distention.  Genitourinary: Denies dysuria, urgency, frequency, hematuria, flank pain and difficulty urinating.  Skin: Denies pallor, rash and wound.  Neurological: Denies dizziness, seizures, syncope, weakness, lightheadedness, numbness and headaches.    Past Medical History  Diagnosis Date  . PE (pulmonary embolism)     unprovoked PE completed 6 months of warfarin, warfarin d/ced 02/12/2010, repeat PE 07/10/2010 after a long car ride and now on lifelong coumadin.  [name] Hypertension   . Pituitary microadenoma     incidental finding CT 12/2009  .  Prostate cancer   . Depression   . Hyperlipidemia   . Coronary artery disease, non-occlusive     with history of MI; Cath 2008 w multivessel nonobstructive CAD  . Dementia    Current Outpatient Prescriptions  Medication Sig Dispense Refill  . acetaminophen (TYLENOL) 500 MG tablet Take 2 tablets (1,000 mg total) by mouth 3 (three) times daily.  100 tablet  2  . amLODipine (NORVASC) 10 MG tablet Take 10 mg by mouth daily.      [name] atorvastatin (LIPITOR) 40 MG tablet Take 40 mg by mouth every evening.      [name] b complex vitamins tablet Take 1 tablet by mouth daily.      . Calcium Carbonate-Vitamin D (CALCIUM-VITAMIN D) 500-200 MG-UNIT per tablet Take 1 tablet by mouth daily.      . carbidopa-levodopa (SINEMET IR) 25-100 MG per tablet Take 1 tablet by mouth 2 (two) times daily.      . fish oil-omega-3 fatty acids 1000 MG capsule Take 2 g by mouth daily.       [name] lisinopril (PRINIVIL,ZESTRIL) 40 MG tablet Take 40 mg by mouth daily.      . memantine (NAMENDA) 10 MG tablet Take 10 mg by mouth 2 (two) times daily.      . Multiple Vitamins-Minerals (MULTIVITAMIN WITH MINERALS) tablet Take 1 tablet by mouth daily.      [name] NAMENDA XR 28 MG CP24 Take 28 mg by mouth daily.  30 capsule  12  . nortriptyline (PAMELOR) 10 MG capsule Take 1 capsule (10 mg total) by mouth  at bedtime.  90 capsule  3  . omeprazole (PRILOSEC) 20 MG capsule Take 20 mg by mouth daily.      . sertraline (ZOLOFT) 25 MG tablet Take 25 mg by mouth daily.      [name] terbinafine (LAMISIL) 250 MG tablet Take 250 mg by mouth daily.      [name] warfarin (COUMADIN) 5 MG tablet Take 5-705 mg by mouth daily at 6 PM. On mondays take 1.5 tablets (7.5 mg) and on all other days take 1 tablet (5 mg)       No current facility-administered medications for this visit.   Family History  Problem Relation Age of Onset  . Hypertension Father     Passed away from cerebral hemorrhage at age of 39.  [name] Dementia Mother     Passed away  . Hypertension Child     4  adult children  . Cervical cancer Other   . Lung cancer Other   . Stroke Other    History   Social History  . Marital Status: Married    Spouse Name: N/A    Number of Children: 4  . Years of Education: master's   Occupational History  .      retired   Social History Main Topics  . Smoking status: Never Smoker   . Smokeless tobacco: Never Used  . Alcohol Use: No  . Drug Use: No  . Sexual Activity: Yes   Other Topics Concern  . Not on file   Social History Narrative   Lives with daughter. Wife also has severe dementia.     Objective:  Physical Exam: Filed Vitals:   02/13/14 1602  BP: 127/81  Pulse: 69  Temp: 98.4 F (36.9 C)  TempSrc: Oral  Weight: 162 lb 1.6 oz (73.528 kg)  SpO2: 97%   General: no distress, appears as stated age HEENT: PERRL, EOMI, no scleral icterus Cardiac: RRR, no rubs, murmurs or gallops Pulm: clear to auscultation bilaterally, moving normal volumes of air Abd: soft, nontender, nondistended, BS present Ext: warm and well perfused, no pedal edema Neuro: alert and oriented X3, cranial nerves II-XII grossly intact, cautious, shuffling gait, good passive hip flexion and internal rotation without obvious external bruising, pain is over right iliac crest but no significant tenderness to palpation  Assessment & Plan:  Case and care discussed with Dr. Lynnae January.  Please see problem oriented charting for further details. Patient to call if no better in 1 week.

## 2014-02-13 NOTE — Assessment & Plan Note (Addendum)
Patient is s/p fall on 02/07/14, x-ray in ED at that time revealed no fracture.  Story compatible with mechanical fall.  Physical exam does not suggest ROM limitations on hip flexion or internal rotation.  No obvious hematoma externally.  Concerning would be internal hematoma vs hairline fracture, but likely bone bruise.  -Add tramadol to APAP (patient NOT taking nortriptyline regularly for sleep - his daughter holds this medication if giving tramadol) -Check CBC & INR -If no better, may need to consider reimaging in 1 week  -If improves, may continue with PT as planned for June 8th

## 2014-02-13 NOTE — Patient Instructions (Signed)
-  Continue tylenol as you have been, and add tramadol as needed - try to back off of the ibuprofen.   -If no better in a week, let us know! We may need to consider further imaging.  Please be sure to bring all of your medications with you to every visit.  Should you have any new or worsening symptoms, please be sure to call the clinic at [phone].

## 2014-02-15 ENCOUNTER — Other Ambulatory Visit: Payer: Self-pay | Admitting: Internal Medicine

## 2014-02-20 NOTE — Progress Notes (Signed)
Case discussed with Dr. Sharda soon after the resident saw the patient.  We reviewed the resident's history and exam and pertinent patient test results.  I agree with the assessment, diagnosis, and plan of care documented in the resident's note. 

## 2014-02-25 ENCOUNTER — Ambulatory Visit: Payer: Medicare HMO | Attending: Internal Medicine

## 2014-02-25 DIAGNOSIS — R269 Unspecified abnormalities of gait and mobility: Secondary | ICD-10-CM | POA: Insufficient documentation

## 2014-02-25 DIAGNOSIS — IMO0001 Reserved for inherently not codable concepts without codable children: Secondary | ICD-10-CM | POA: Insufficient documentation

## 2014-02-25 DIAGNOSIS — R293 Abnormal posture: Secondary | ICD-10-CM | POA: Insufficient documentation

## 2014-02-25 DIAGNOSIS — M545 Low back pain, unspecified: Secondary | ICD-10-CM | POA: Insufficient documentation

## 2014-03-01 ENCOUNTER — Ambulatory Visit: Payer: Medicare HMO

## 2014-03-01 NOTE — Progress Notes (Signed)
I reviewed note and agree with plan.   VIKRAM R. PENUMALLI, MD  Certified in Neurology, Neurophysiology and Neuroimaging  [facility] [address], [address] [place], [place] [address] [phone]   

## 2014-03-05 ENCOUNTER — Ambulatory Visit: Payer: Medicare HMO

## 2014-03-11 ENCOUNTER — Telehealth: Payer: Self-pay | Admitting: *Deleted

## 2014-03-11 NOTE — Telephone Encounter (Signed)
I called back.  The co-pay for Namenda is over $100 per month and they wanted to know if any discounts are available.  I provided the number for [employer] [phone]) and recommended they contact PAP.  I most cases they are able to assist the patient's with this med, provided they meet certain financial criteria.  I explained this, they verbalized understanding and will call us back if anything further is needed.

## 2014-03-12 ENCOUNTER — Encounter: Payer: Commercial Managed Care - HMO | Admitting: Physical Therapy

## 2014-03-13 ENCOUNTER — Other Ambulatory Visit: Payer: Self-pay | Admitting: Internal Medicine

## 2014-03-14 ENCOUNTER — Ambulatory Visit: Payer: Medicare HMO | Admitting: Physical Therapy

## 2014-03-18 ENCOUNTER — Ambulatory Visit (INDEPENDENT_AMBULATORY_CARE_PROVIDER_SITE_OTHER): Payer: Commercial Managed Care - HMO | Admitting: Pharmacist

## 2014-03-18 DIAGNOSIS — Z7902 Long term (current) use of antithrombotics/antiplatelets: Secondary | ICD-10-CM

## 2014-03-18 DIAGNOSIS — I2699 Other pulmonary embolism without acute cor pulmonale: Secondary | ICD-10-CM

## 2014-03-18 LAB — POCT INR: INR: 2

## 2014-03-18 NOTE — Progress Notes (Signed)
Anti-Coagulation Progress Note  [name] [name] is a 80 y.o. male who is currently on an anti-coagulation regimen.    RECENT RESULTS: Recent results are below, the most recent result is correlated with a dose of 37.5 mg. per week: Lab Results  Component Value Date   INR 2.00 03/18/2014   INR 2.45* 02/13/2014   INR 2.22* 01/31/2014    ANTI-COAG DOSE: Anticoagulation Dose Instructions as of 03/18/2014     [name] Tue Wed Thu Fri Sat   New Dose 5 mg 7.5 mg 5 mg 7.5 mg 5 mg 7.5 mg 5 mg       ANTICOAG SUMMARY: Anticoagulation Episode Summary   Current INR goal 2.0-3.0  Next INR check 04/01/2014  INR from last check 2.00 (03/18/2014)  Weekly max dose   Target end date Indefinite  [facility]  Preferred lab   Send INR reminders to [facility]   Indications  Pulmonary embolism [415.19] Encounter for long-term use of antiplatelets/antithrombotics [V58.63]        Comments History of multiple venous embolic episodes. Will continue to annual re-evaluate continued need for warfarin weighing risks vs. benefits.       Anticoagulation Care Providers   Provider Role Specialty Phone number   Axel Filler, MD  Internal Medicine [phone]      ANTICOAG TODAY: Anticoagulation Summary as of 03/18/2014   INR goal 2.0-3.0  Selected INR 2.00 (03/18/2014)  Next INR check 04/01/2014  Target end date Indefinite   Indications  Pulmonary embolism [415.19] Encounter for long-term use of antiplatelets/antithrombotics [V58.63]      Anticoagulation Episode Summary   [facility]   Preferred lab    Send INR reminders to [facility]   Comments History of multiple venous embolic episodes. Will continue to annual re-evaluate continued need for warfarin weighing risks vs. benefits.     Anticoagulation Care Providers   Provider Role Specialty Phone number   Axel Filler, MD  Internal Medicine [phone]      PATIENT  INSTRUCTIONS: Patient Instructions  Patient instructed to take medications as defined in the Anti-coagulation Track section of this encounter.  Patient instructed to take today's dose.  Patient verbalized understanding of these instructions.       FOLLOW-UP Return in 2 weeks (on 04/01/2014) for Follow up INR at [place], [name] Pharm.D., CACP

## 2014-03-18 NOTE — Patient Instructions (Signed)
Patient instructed to take medications as defined in the Anti-coagulation Track section of this encounter.  Patient instructed to take today's dose.  Patient verbalized understanding of these instructions.    

## 2014-03-19 ENCOUNTER — Other Ambulatory Visit: Payer: Self-pay | Admitting: Internal Medicine

## 2014-03-20 ENCOUNTER — Ambulatory Visit: Payer: Medicare HMO | Attending: Internal Medicine | Admitting: Physical Therapy

## 2014-03-20 ENCOUNTER — Other Ambulatory Visit: Payer: Self-pay | Admitting: Internal Medicine

## 2014-03-20 DIAGNOSIS — M545 Low back pain, unspecified: Secondary | ICD-10-CM | POA: Insufficient documentation

## 2014-03-20 DIAGNOSIS — IMO0001 Reserved for inherently not codable concepts without codable children: Secondary | ICD-10-CM | POA: Insufficient documentation

## 2014-03-20 DIAGNOSIS — R269 Unspecified abnormalities of gait and mobility: Secondary | ICD-10-CM | POA: Insufficient documentation

## 2014-03-20 DIAGNOSIS — R293 Abnormal posture: Secondary | ICD-10-CM | POA: Insufficient documentation

## 2014-03-20 NOTE — Progress Notes (Signed)
Please resend Rx for coumadin electronically.

## 2014-03-21 ENCOUNTER — Other Ambulatory Visit: Payer: Self-pay | Admitting: Internal Medicine

## 2014-03-21 ENCOUNTER — Telehealth: Payer: Self-pay | Admitting: Internal Medicine

## 2014-03-21 MED ORDER — WARFARIN SODIUM 5 MG PO TABS: ORAL_TABLET | ORAL | Status: DC

## 2014-03-21 NOTE — Telephone Encounter (Signed)
I was unable to send Coumadin rx electronically.  Rx was phoned in to patient's pharmacy.    [name], DO

## 2014-04-01 ENCOUNTER — Ambulatory Visit: Payer: Commercial Managed Care - HMO

## 2014-04-08 ENCOUNTER — Ambulatory Visit (INDEPENDENT_AMBULATORY_CARE_PROVIDER_SITE_OTHER): Payer: Commercial Managed Care - HMO | Admitting: Pharmacist

## 2014-04-08 DIAGNOSIS — Z7902 Long term (current) use of antithrombotics/antiplatelets: Secondary | ICD-10-CM

## 2014-04-08 DIAGNOSIS — I2699 Other pulmonary embolism without acute cor pulmonale: Secondary | ICD-10-CM

## 2014-04-08 LAB — POCT INR: INR: 2.6

## 2014-04-08 NOTE — Patient Instructions (Signed)
Patient instructed to take medications as defined in the Anti-coagulation Track section of this encounter.  Patient instructed to take today's dose.  Patient verbalized understanding of these instructions.    

## 2014-04-08 NOTE — Progress Notes (Signed)
Anti-Coagulation Progress Note  [name] [name] is a 80 y.o. male who is currently on an anti-coagulation regimen.    RECENT RESULTS: Recent results are below, the most recent result is correlated with a dose of 42.5 mg. per week: Lab Results  Component Value Date   INR 2.60 04/08/2014   INR 2.00 03/18/2014   INR 2.45* 02/13/2014    ANTI-COAG DOSE: Anticoagulation Dose Instructions as of 04/08/2014     [name] Tue Wed Thu Fri Sat   New Dose 5 mg 7.5 mg 5 mg 7.5 mg 5 mg 7.5 mg 5 mg       ANTICOAG SUMMARY: Anticoagulation Episode Summary   Current INR goal 2.0-3.0  Next INR check 05/06/2014  INR from last check 2.60 (04/08/2014)  Weekly max dose   Target end date Indefinite  [facility]  Preferred lab   Send INR reminders to [facility]   Indications  Pulmonary embolism [415.19] Encounter for long-term use of antiplatelets/antithrombotics [V58.63]        Comments History of multiple venous embolic episodes. Will continue to annual re-evaluate continued need for warfarin weighing risks vs. benefits.       Anticoagulation Care Providers   Provider Role Specialty Phone number   Axel Filler, MD  Internal Medicine [phone]      ANTICOAG TODAY: Anticoagulation Summary as of 04/08/2014   INR goal 2.0-3.0  Selected INR 2.60 (04/08/2014)  Next INR check 05/06/2014  Target end date Indefinite   Indications  Pulmonary embolism [415.19] Encounter for long-term use of antiplatelets/antithrombotics [V58.63]      Anticoagulation Episode Summary   [facility]   Preferred lab    Send INR reminders to [facility]   Comments History of multiple venous embolic episodes. Will continue to annual re-evaluate continued need for warfarin weighing risks vs. benefits.     Anticoagulation Care Providers   Provider Role Specialty Phone number   Axel Filler, MD  Internal Medicine [phone]      PATIENT INSTRUCTIONS: Patient  Instructions  Patient instructed to take medications as defined in the Anti-coagulation Track section of this encounter.  Patient instructed to take today's dose.  Patient verbalized understanding of these instructions.       FOLLOW-UP Return in 4 weeks (on 05/06/2014) for Follow up INR at [place], [name] Pharm.D., CACP

## 2014-04-18 ENCOUNTER — Ambulatory Visit (INDEPENDENT_AMBULATORY_CARE_PROVIDER_SITE_OTHER): Payer: Commercial Managed Care - HMO | Admitting: Internal Medicine

## 2014-04-18 ENCOUNTER — Encounter: Payer: Self-pay | Admitting: Internal Medicine

## 2014-04-18 VITALS — BP 122/86 | HR 66 | Temp 97.6°F | Ht 65.0 in | Wt 159.7 lb

## 2014-04-18 DIAGNOSIS — M47817 Spondylosis without myelopathy or radiculopathy, lumbosacral region: Secondary | ICD-10-CM

## 2014-04-18 NOTE — Patient Instructions (Addendum)
1.Try Tylenol 500mg  - 1000mg  every 12 hours if you need it.  You can take every 8 hours if pain is bad.  You can also try taking ibuprofen 400mg  every 8 hours if needed on some days. Call me in 1-2 weeks if symptoms are not better with these meds.   2. Please take all medications as prescribed.    3. If you have worsening of your symptoms or new symptoms arise, please call the clinic ([phone]), or go to the ER immediately if symptoms are severe.

## 2014-04-18 NOTE — Progress Notes (Signed)
   Subjective:    Patient ID: [name] [name], male    DOB: 27-Dec-1930, 80 y.o.   MRN: [id number]  HPI Comments: [name] [name] is an 80 year old male with a PMH of Alzheimer's, DM, HTN and DJD here for pain medication refill.  Patinet with complaint of upper back and B/L rib pain x 20 years.  Had fall from a roof 10 years ago and fractured ribs.  Pain 6-7/10 can 10/10.  Tylenol 1-2x per day prn.  He was prescribed a short course of tramadol for hip pain a few months ago and felt that helped with the back pain as well.  He has run out of Tramadol and requests refill.  Tylenol, NSAIDS and tramadol help the pain.  Cold weather makes it worse.   Also, his daughter felt a lump under his right arm recently.       Review of Systems  Constitutional: Negative for fever, chills and appetite change.  Respiratory: Negative for cough and shortness of breath.   Cardiovascular: Negative for chest pain.  Gastrointestinal: Negative for nausea, vomiting, abdominal pain, diarrhea, constipation and blood in stool.  Genitourinary: Negative for dysuria, frequency and difficulty urinating.  Neurological: Negative for syncope, weakness and light-headedness.       Objective:   Physical Exam  Vitals reviewed. Constitutional: He is oriented to person, place, and time. He appears well-developed. No distress.  HENT:  Head: Normocephalic and atraumatic.  Mouth/Throat: Oropharynx is clear and moist. No oropharyngeal exudate.  Eyes: Pupils are equal, round, and reactive to light.  Cardiovascular: Normal rate, regular rhythm and normal heart sounds.  Exam reveals no gallop and no friction rub.   No murmur heard. Pulmonary/Chest: Effort normal and breath sounds normal. No respiratory distress. He has no wheezes. He has no rales.  Abdominal: Soft. Bowel sounds are normal. He exhibits no distension. There is no tenderness.  Musculoskeletal: Normal range of motion. He exhibits tenderness. He exhibits no edema.  5/5  strength of upper and lower extremities; no lumbar or thoracic tenderness.  Anterolateral ribs mildly tender to palpation. I can appreciate no swelling or lymph nodes in the right or left axillae  Neurological: He is alert and oriented to person, place, and time. No cranial nerve deficit.  Skin: Skin is warm. He is not diaphoretic.  Psychiatric: He has a normal mood and affect. His behavior is normal.          Assessment & Plan:  Please see problem based assesmsant and plan.

## 2014-04-19 NOTE — Assessment & Plan Note (Addendum)
Patient with chronic back/rib pain s/p fall from roof 10 years ago.  No recent falls or injury.  No change in pain quality.  Tylenol and NSAIDs controlling pain in past.  Prescribed Tramadol at one point for pain but ran out.  I am hesitant to prescribe Tramadol or other opioid given his age, Alzheimer's dementia and current use of Zoloft, Namenda. - patient and son are agreeable to him increasing dose of Tylenol prn (500-1000mg ) q12h prn; he can also try ibuprofen 400mg  q8h prn on days he does not take Tylenol. - they will call me if symptoms are not controlled on this regimen after 1-2 weeks; at that time I will reconsider short course of Tramadol. - referral to PT

## 2014-04-22 ENCOUNTER — Other Ambulatory Visit: Payer: Self-pay | Admitting: Internal Medicine

## 2014-04-22 ENCOUNTER — Telehealth: Payer: Self-pay | Admitting: Licensed Clinical Social Worker

## 2014-04-22 DIAGNOSIS — M47817 Spondylosis without myelopathy or radiculopathy, lumbosacral region: Secondary | ICD-10-CM

## 2014-04-22 NOTE — Progress Notes (Signed)
Case discussed with Dr. Wilson at the time of the visit.  We reviewed the resident's history and exam and pertinent patient test results.  I agree with the assessment, diagnosis, and plan of care documented in the resident's note. 

## 2014-04-22 NOTE — Telephone Encounter (Signed)
[name] [name] was referred to CSW for home health services.  Pt was receiving outpatient PT but daughter having difficulty transportation [name] [name] for services.  Pt has utilized [facility] in the past and daughter would like referral.  Daughter aware awaiting order for home health service and [name] [name] is agreeable to accept referral.  CSW obtained address to pt's residence and confirmed contact person.

## 2014-04-24 ENCOUNTER — Telehealth: Payer: Self-pay | Admitting: *Deleted

## 2014-04-24 NOTE — Telephone Encounter (Signed)
Roselie Awkward with [facility] [phone]  called to continue with PT for balance and strenght. VO given to cont with PT for next 3 weeks. Hilda Blades Julietta Batterman RN 04/24/14 4:14PM

## 2014-04-25 NOTE — Telephone Encounter (Signed)
Referral and demographics faxed to [facility].

## 2014-04-27 ENCOUNTER — Other Ambulatory Visit: Payer: Self-pay | Admitting: Internal Medicine

## 2014-04-29 ENCOUNTER — Other Ambulatory Visit: Payer: Self-pay | Admitting: *Deleted

## 2014-04-30 MED ORDER — ATORVASTATIN CALCIUM 40 MG PO TABS: 40.0000 mg | ORAL_TABLET | Freq: Every evening | ORAL | Status: DC

## 2014-05-01 NOTE — Addendum Note (Signed)
Addended by: Hulan Fray on: 05/01/2014 09:09 PM   Modules accepted: Orders

## 2014-05-06 ENCOUNTER — Ambulatory Visit (INDEPENDENT_AMBULATORY_CARE_PROVIDER_SITE_OTHER): Payer: Commercial Managed Care - HMO | Admitting: Pharmacist

## 2014-05-06 DIAGNOSIS — I2699 Other pulmonary embolism without acute cor pulmonale: Secondary | ICD-10-CM

## 2014-05-06 DIAGNOSIS — Z7902 Long term (current) use of antithrombotics/antiplatelets: Secondary | ICD-10-CM

## 2014-05-06 LAB — POCT INR
INR: 3.6
INR: 3.6

## 2014-05-06 MED ORDER — WARFARIN SODIUM 5 MG PO TABS: ORAL_TABLET | ORAL | Status: DC

## 2014-05-06 NOTE — Patient Instructions (Signed)
Patient instructed to take medications as defined in the Anti-coagulation Track section of this encounter.  Patient instructed to take today's dose.  Patient verbalized understanding of these instructions.    

## 2014-05-06 NOTE — Progress Notes (Signed)
Anti-Coagulation Progress Note  [name] [name] is a 80 y.o. male who is currently on an anti-coagulation regimen.    RECENT RESULTS: Recent results are below, the most recent result is correlated with a dose of 42.5 mg. per week: Lab Results  Component Value Date   INR 3.60 05/06/2014   INR 3.6 05/06/2014   INR 2.60 04/08/2014    ANTI-COAG DOSE: Anticoagulation Dose Instructions as of 05/06/2014     [name] Tue Wed Thu Fri Sat   New Dose 5 mg 7.5 mg 5 mg 5 mg 5 mg 5 mg 5 mg       ANTICOAG SUMMARY: Anticoagulation Episode Summary   Current INR goal 2.0-3.0  Next INR check 05/20/2014  INR from last check 3.60! (05/06/2014)  Weekly max dose   Target end date Indefinite  [facility]  Preferred lab   Send INR reminders to [facility]   Indications  Pulmonary embolism [415.19] Encounter for long-term use of antiplatelets/antithrombotics [V58.63]        Comments History of multiple venous embolic episodes. Will continue to annual re-evaluate continued need for warfarin weighing risks vs. benefits.       Anticoagulation Care Providers   Provider Role Specialty Phone number   Axel Filler, MD  Internal Medicine [phone]      ANTICOAG TODAY: Anticoagulation Summary as of 05/06/2014   INR goal 2.0-3.0  Selected INR 3.60! (05/06/2014)  Next INR check 05/20/2014  Target end date Indefinite   Indications  Pulmonary embolism [415.19] Encounter for long-term use of antiplatelets/antithrombotics [V58.63]      Anticoagulation Episode Summary   [facility]   Preferred lab    Send INR reminders to [facility]   Comments History of multiple venous embolic episodes. Will continue to annual re-evaluate continued need for warfarin weighing risks vs. benefits.     Anticoagulation Care Providers   Provider Role Specialty Phone number   Axel Filler, MD  Internal Medicine [phone]      PATIENT INSTRUCTIONS: Patient  Instructions  Patient instructed to take medications as defined in the Anti-coagulation Track section of this encounter.  Patient instructed to take today's dose.  Patient verbalized understanding of these instructions.       FOLLOW-UP Return in 2 weeks (on 05/20/2014) for Follow up INR at 2:30PM.  Jorene Guest, [name] Pharm.D., CACP

## 2014-05-07 NOTE — Progress Notes (Signed)
INTERNAL MEDICINE TEACHING ATTENDING ADDENDUM - Aldine Contes M.D  Duration- indefinite, Indication- PE, INR- supratherapeutic. Agree with Dr. Gladstone Pih recommendations as outlined in his note.

## 2014-05-13 ENCOUNTER — Other Ambulatory Visit: Payer: Self-pay | Admitting: *Deleted

## 2014-05-13 DIAGNOSIS — M25551 Pain in right hip: Secondary | ICD-10-CM

## 2014-05-13 MED ORDER — LISINOPRIL 40 MG PO TABS: 40.0000 mg | ORAL_TABLET | Freq: Every day | ORAL | Status: DC

## 2014-05-20 ENCOUNTER — Ambulatory Visit (INDEPENDENT_AMBULATORY_CARE_PROVIDER_SITE_OTHER): Payer: Commercial Managed Care - HMO | Admitting: Pharmacist

## 2014-05-20 DIAGNOSIS — I2699 Other pulmonary embolism without acute cor pulmonale: Secondary | ICD-10-CM

## 2014-05-20 DIAGNOSIS — Z7902 Long term (current) use of antithrombotics/antiplatelets: Secondary | ICD-10-CM

## 2014-05-20 LAB — POCT INR: INR: 3.1

## 2014-05-20 NOTE — Patient Instructions (Signed)
Patient instructed to take medications as defined in the Anti-coagulation Track section of this encounter.  Patient instructed to take today's dose.  Patient verbalized understanding of these instructions.    

## 2014-05-20 NOTE — Progress Notes (Signed)
Anti-Coagulation Progress Note  [name] [name] is a 80 y.o. male who is currently on an anti-coagulation regimen.    RECENT RESULTS: Recent results are below, the most recent result is correlated with a dose of 37.5 mg. per week: Lab Results  Component Value Date   INR 3.10 05/20/2014   INR 3.60 05/06/2014   INR 3.6 05/06/2014    ANTI-COAG DOSE: Anticoagulation Dose Instructions as of 05/20/2014     [name] Tue Wed Thu Fri Sat   New Dose 5 mg 5 mg 5 mg 5 mg 5 mg 5 mg 5 mg       ANTICOAG SUMMARY: Anticoagulation Episode Summary   Current INR goal 2.0-3.0  Next INR check 06/17/2014  INR from last check 3.10! (05/20/2014)  Weekly max dose   Target end date Indefinite  [facility] check location [facility]  Preferred lab   Send INR reminders to [facility]AG [facility]   Indications  Pulmonary embolism [415.19] Encounter for long-term use of antiplatelets/antithrombotics [V58.63]        Comments History of multiple venous embolic episodes. Will continue to annual re-evaluate continued need for warfarin weighing risks vs. benefits.       Anticoagulation Care Providers   Provider Role Specialty Phone number   Axel Filler, MD  Internal Medicine [phone]      ANTICOAG TODAY: Anticoagulation Summary as of 05/20/2014   INR goal 2.0-3.0  Selected INR 3.10! (05/20/2014)  Next INR check 06/17/2014  Target end date Indefinite   Indications  Pulmonary embolism [415.19] Encounter for long-term use of antiplatelets/antithrombotics [V58.63]      Anticoagulation Episode Summary   [facility] check [facility]   Preferred lab    Send INR reminders to [facility]AG [facility]   Comments History of multiple venous embolic episodes. Will continue to annual re-evaluate continued need for warfarin weighing risks vs. benefits.     Anticoagulation Care Providers   Provider Role Specialty Phone number   Axel Filler, MD  Internal Medicine [phone]      PATIENT INSTRUCTIONS: Patient  Instructions  Patient instructed to take medications as defined in the Anti-coagulation Track section of this encounter.  Patient instructed to take today's dose.  Patient verbalized understanding of these instructions.       FOLLOW-UP Return in 4 weeks (on 06/17/2014) for Follow up INR at 2:15PM.  Jorene Guest, [name] Pharm.D., CACP

## 2014-05-23 NOTE — Progress Notes (Signed)
INTERNAL MEDICINE TEACHING ATTENDING ADDENDUM - Aldine Contes M.D  Duration- indefinite, Indication- Recurrent PE, INR- supratherapeutic. Agree with Dr. Gladstone Pih recommendations as outlined in his note.

## 2014-05-24 ENCOUNTER — Other Ambulatory Visit: Payer: Self-pay | Admitting: *Deleted

## 2014-05-27 ENCOUNTER — Other Ambulatory Visit: Payer: Self-pay | Admitting: Internal Medicine

## 2014-05-28 MED ORDER — AMLODIPINE BESYLATE 10 MG PO TABS: 10.0000 mg | ORAL_TABLET | Freq: Every day | ORAL | Status: DC

## 2014-06-10 ENCOUNTER — Other Ambulatory Visit: Payer: Self-pay | Admitting: Internal Medicine

## 2014-06-10 DIAGNOSIS — F32A Depression, unspecified: Secondary | ICD-10-CM

## 2014-06-10 DIAGNOSIS — F329 Major depressive disorder, single episode, unspecified: Secondary | ICD-10-CM

## 2014-06-12 ENCOUNTER — Other Ambulatory Visit: Payer: Self-pay | Admitting: Internal Medicine

## 2014-06-12 DIAGNOSIS — F329 Major depressive disorder, single episode, unspecified: Secondary | ICD-10-CM

## 2014-06-12 DIAGNOSIS — F32A Depression, unspecified: Secondary | ICD-10-CM

## 2014-06-17 ENCOUNTER — Ambulatory Visit: Payer: Commercial Managed Care - HMO

## 2014-06-21 NOTE — Telephone Encounter (Signed)
NS appt at [facility] 06/17/14.

## 2014-06-24 ENCOUNTER — Ambulatory Visit (INDEPENDENT_AMBULATORY_CARE_PROVIDER_SITE_OTHER): Payer: Commercial Managed Care - HMO | Admitting: Pharmacist

## 2014-06-24 DIAGNOSIS — Z7901 Long term (current) use of anticoagulants: Secondary | ICD-10-CM

## 2014-06-24 DIAGNOSIS — Z7902 Long term (current) use of antithrombotics/antiplatelets: Secondary | ICD-10-CM

## 2014-06-24 DIAGNOSIS — I2699 Other pulmonary embolism without acute cor pulmonale: Secondary | ICD-10-CM

## 2014-06-24 DIAGNOSIS — Z86711 Personal history of pulmonary embolism: Secondary | ICD-10-CM

## 2014-06-24 LAB — POCT INR: INR: 2.2

## 2014-06-24 NOTE — Progress Notes (Signed)
Anti-Coagulation Progress Note  [name] [name] is a 80 y.o. male who is currently on an anti-coagulation regimen.    RECENT RESULTS: Recent results are below, the most recent result is correlated with a dose of 35 mg. per week: Lab Results  Component Value Date   INR 2.20 06/24/2014   INR 3.10 05/20/2014   INR 3.60 05/06/2014    ANTI-COAG DOSE: Anticoagulation Dose Instructions as of 06/24/2014     [name] [name] Tue Wed Thu Fri Sat   New Dose 5 mg 5 mg 5 mg 5 mg 5 mg 5 mg 5 mg       ANTICOAG SUMMARY: Anticoagulation Episode Summary   Current INR goal 2.0-3.0  Next INR check 07/22/2014  INR from last check 2.20 (06/24/2014)  Weekly max dose   Target end date Indefinite  [facility]  Preferred lab   Send INR reminders to ANTICOAG [facility]   Indications  Pulmonary embolism [I26.99] Encounter for long-term use of antiplatelets/antithrombotics [Z79.02]        Comments History of multiple venous embolic episodes. Will continue to annual re-evaluate continued need for warfarin weighing risks vs. benefits.       Anticoagulation Care Providers   Provider Role Specialty Phone number   [name] Filler, MD  Internal Medicine [phone]      ANTICOAG TODAY: Anticoagulation Summary as of 06/24/2014   INR goal 2.0-3.0  Selected INR 2.20 (06/24/2014)  Next INR check 07/22/2014  Target end date Indefinite   Indications  Pulmonary embolism [I26.99] Encounter for long-term use of antiplatelets/antithrombotics [Z79.02]      Anticoagulation Episode Summary   [facility]   Preferred lab    Send INR reminders to ANT[facility]   Comments History of multiple venous embolic episodes. Will continue to annual re-evaluate continued need for warfarin weighing risks vs. benefits.     Anticoagulation Care Providers   Provider Role Specialty Phone number   [name] Filler, MD  Internal Medicine [phone]      PATIENT INSTRUCTIONS: Patient  Instructions  Patient instructed to take medications as defined in the Anti-coagulation Track section of this encounter.  Patient instructed to take today's dose.  Patient verbalized understanding of these instructions.       FOLLOW-UP Return in 4 weeks (on 07/22/2014) for Follow up INR at 1030h.  Jorene Guest, [name] Pharm.D., CACP

## 2014-06-24 NOTE — Patient Instructions (Signed)
Patient instructed to take medications as defined in the Anti-coagulation Track section of this encounter.  Patient instructed to take today's dose.  Patient verbalized understanding of these instructions.    

## 2014-07-01 NOTE — Progress Notes (Signed)
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  His INR was 2.2.  I have reviewed Dr. Gladstone Pih note.

## 2014-07-06 ENCOUNTER — Other Ambulatory Visit: Payer: Self-pay | Admitting: Internal Medicine

## 2014-07-20 ENCOUNTER — Other Ambulatory Visit: Payer: Self-pay | Admitting: Internal Medicine

## 2014-07-22 ENCOUNTER — Encounter: Payer: Self-pay | Admitting: Internal Medicine

## 2014-07-22 ENCOUNTER — Ambulatory Visit: Payer: Commercial Managed Care - HMO

## 2014-07-25 ENCOUNTER — Ambulatory Visit: Payer: Commercial Managed Care - HMO | Admitting: Neurology

## 2014-07-29 ENCOUNTER — Ambulatory Visit (INDEPENDENT_AMBULATORY_CARE_PROVIDER_SITE_OTHER): Payer: Commercial Managed Care - HMO | Admitting: Pharmacist

## 2014-07-29 ENCOUNTER — Ambulatory Visit (INDEPENDENT_AMBULATORY_CARE_PROVIDER_SITE_OTHER): Payer: Commercial Managed Care - HMO | Admitting: *Deleted

## 2014-07-29 ENCOUNTER — Ambulatory Visit: Payer: Commercial Managed Care - HMO | Admitting: *Deleted

## 2014-07-29 DIAGNOSIS — Z7902 Long term (current) use of antithrombotics/antiplatelets: Secondary | ICD-10-CM

## 2014-07-29 DIAGNOSIS — I2699 Other pulmonary embolism without acute cor pulmonale: Secondary | ICD-10-CM

## 2014-07-29 DIAGNOSIS — Z23 Encounter for immunization: Secondary | ICD-10-CM

## 2014-07-29 LAB — POCT INR: INR: 2.2

## 2014-07-29 NOTE — Progress Notes (Signed)
Anti-Coagulation Progress Note  [name] [name] is a 80 y.o. male who is currently on an anti-coagulation regimen.    RECENT RESULTS: Recent results are below, the most recent result is correlated with a dose of 35 mg. per week: Lab Results  Component Value Date   INR 2.20 06/24/2014   INR 3.10 05/20/2014   INR 3.60 05/06/2014    ANTI-COAG DOSE: Anticoagulation Dose Instructions as of 07/29/2014      [name] Tue Wed Thu Fri Sat   New Dose 5 mg 5 mg 5 mg 5 mg 5 mg 5 mg 5 mg       ANTICOAG SUMMARY: Anticoagulation Episode Summary    Current INR goal 2.0-3.0  Next INR check 08/19/2014  INR from last check 2.20 (06/24/2014)  Weekly max dose   Target end date Indefinite  [facility]  Preferred lab   Send INR reminders to ANT[facility]   Indications  Pulmonary embolism [I26.99] Encounter for long-term use of antiplatelets/antithrombotics [Z79.02]        Comments History of multiple venous embolic episodes. Will continue to annual re-evaluate continued need for warfarin weighing risks vs. benefits.       Anticoagulation Care Providers    Provider Role Specialty Phone number   Axel Filler, MD  Internal Medicine [phone]      ANTICOAG TODAY: Anticoagulation Summary as of 07/29/2014    INR goal 2.0-3.0  Selected INR 2.20 (06/24/2014)  Next INR check 08/19/2014  Target end date Indefinite   Indications  Pulmonary embolism [I26.99] Encounter for long-term use of antiplatelets/antithrombotics [Z79.02]      Anticoagulation Episode Summary    [facility]   Preferred lab    Send INR reminders to ANTICOAG [facility]   Comments History of multiple venous embolic episodes. Will continue to annual re-evaluate continued need for warfarin weighing risks vs. benefits.     Anticoagulation Care Providers    Provider Role Specialty Phone number   Axel Filler, MD  Internal Medicine [phone]      PATIENT  INSTRUCTIONS: Patient Instructions  Patient instructed to take medications as defined in the Anti-coagulation Track section of this encounter.  Patient instructed to take today's dose.  Patient verbalized understanding of these instructions.       FOLLOW-UP Return in 3 weeks (on 08/19/2014) for Follow up INR at 0945h.  Jorene Guest, [name] Pharm.D., CACP

## 2014-07-29 NOTE — Patient Instructions (Signed)
Patient instructed to take medications as defined in the Anti-coagulation Track section of this encounter.  Patient instructed to take today's dose.  Patient verbalized understanding of these instructions.    

## 2014-07-31 NOTE — Telephone Encounter (Addendum)
Patient never came and picked up samples of Namenda and Namenda XR.  Patient also never picked up his prescription for Memantine HCI ER 28mg .

## 2014-08-19 ENCOUNTER — Ambulatory Visit (INDEPENDENT_AMBULATORY_CARE_PROVIDER_SITE_OTHER): Payer: Commercial Managed Care - HMO | Admitting: Pharmacist

## 2014-08-19 ENCOUNTER — Other Ambulatory Visit: Payer: Self-pay | Admitting: *Deleted

## 2014-08-19 DIAGNOSIS — Z7902 Long term (current) use of antithrombotics/antiplatelets: Secondary | ICD-10-CM

## 2014-08-19 DIAGNOSIS — I2699 Other pulmonary embolism without acute cor pulmonale: Secondary | ICD-10-CM

## 2014-08-19 LAB — POCT INR: INR: 2.5

## 2014-08-19 MED ORDER — OMEPRAZOLE 20 MG PO CPDR: 20.0000 mg | DELAYED_RELEASE_CAPSULE | Freq: Every day | ORAL | Status: DC

## 2014-08-19 NOTE — Patient Instructions (Signed)
Patient instructed to take medications as defined in the Anti-coagulation Track section of this encounter.  Patient instructed to take today's dose.  Patient verbalized understanding of these instructions.    

## 2014-08-19 NOTE — Progress Notes (Signed)
Anti-Coagulation Progress Note  Matheson Vandehei is a 80 y.o. male who is currently on an anti-coagulation regimen.    RECENT RESULTS: Recent results are below, the most recent result is correlated with a dose of 35 mg. per week: Lab Results  Component Value Date   INR 2.50 08/19/2014   INR 2.20 07/29/2014   INR 2.20 06/24/2014    ANTI-COAG DOSE: Anticoagulation Dose Instructions as of 08/19/2014      [name] Tue Wed Thu Fri Sat   New Dose 5 mg 5 mg 5 mg 5 mg 5 mg 5 mg 5 mg       ANTICOAG SUMMARY: Anticoagulation Episode Summary    Current INR goal 2.0-3.0  Next INR check 09/16/2014  INR from last check 2.50 (08/19/2014)  Weekly max dose   Target end date Indefinite  [facility] check [facility]  Preferred lab   Send INR reminders to ANTICO[facility]   Indications  Pulmonary embolism [I26.99] Encounter for long-term use of antiplatelets/antithrombotics [Z79.02]        Comments History of multiple venous embolic episodes. Will continue to annual re-evaluate continued need for warfarin weighing risks vs. benefits.       Anticoagulation Care Providers    Provider Role Specialty Phone number   Axel Filler, MD  Internal Medicine [phone]      ANTICOAG TODAY: Anticoagulation Summary as of 08/19/2014    INR goal 2.0-3.0  Selected INR 2.50 (08/19/2014)  Next INR check 09/16/2014  Target end date Indefinite   Indications  Pulmonary embolism [I26.99] Encounter for long-term use of antiplatelets/antithrombotics [Z79.02]      Anticoagulation Episode Summary    [facility] check [facility]   Preferred lab    Send INR reminders to ANTICOAG [facility]   Comments History of multiple venous embolic episodes. Will continue to annual re-evaluate continued need for warfarin weighing risks vs. benefits.     Anticoagulation Care Providers    Provider Role Specialty Phone number   Axel Filler, MD  Internal Medicine [phone]      PATIENT  INSTRUCTIONS: Patient Instructions  Patient instructed to take medications as defined in the Anti-coagulation Track section of this encounter.  Patient instructed to take today's dose.  Patient verbalized understanding of these instructions.        FOLLOW-UP Return in 4 weeks (on 09/16/2014) for Follow up INR at 1015h.  Jorene Guest, [name] Pharm.D., CACP

## 2014-09-15 ENCOUNTER — Other Ambulatory Visit: Payer: Self-pay | Admitting: Internal Medicine

## 2014-09-16 ENCOUNTER — Ambulatory Visit (INDEPENDENT_AMBULATORY_CARE_PROVIDER_SITE_OTHER): Payer: Commercial Managed Care - HMO | Admitting: Pharmacist

## 2014-09-16 DIAGNOSIS — Z7902 Long term (current) use of antithrombotics/antiplatelets: Secondary | ICD-10-CM

## 2014-09-16 DIAGNOSIS — I2699 Other pulmonary embolism without acute cor pulmonale: Secondary | ICD-10-CM

## 2014-09-16 LAB — POCT INR: INR: 2

## 2014-09-16 NOTE — Patient Instructions (Signed)
Patient instructed to take medications as defined in the Anti-coagulation Track section of this encounter.  Patient instructed to take today's dose.  Patient verbalized understanding of these instructions.    

## 2014-09-16 NOTE — Progress Notes (Signed)
Anti-Coagulation Progress Note  [name] [name] is a 80 y.o. male who reports to the clinic for monitoring of anticoagulation treatment.    RECENT RESULTS: Recent results are below, the most recent result is correlated with a dose of 35 mg. per week: Lab Results  Component Value Date   INR 2.0 09/16/2014   INR 2.50 08/19/2014   INR 2.20 07/29/2014    Weekly dose was unchanged   ANTI-COAG DOSE: INR as of 09/16/2014 and Previous Dosing Information    INR Dt INR Goal [employer] Sun Mon Tue Wed Thu Fri Sat   09/16/2014 2.0 2.0-3.0 35 mg 5 mg 5 mg 5 mg 5 mg 5 mg 5 mg 5 mg    Anticoagulation Dose Instructions as of 09/16/2014      Total Sun Mon Tue Wed Thu Fri Sat   New Dose 35 mg 5 mg 5 mg 5 mg 5 mg 5 mg 5 mg 5 mg     (5 mg x 1)  (5 mg x 1)  (5 mg x 1)  (5 mg x 1)  (5 mg x 1)  (5 mg x 1)  (5 mg x 1)                           ANTICOAG SUMMARY: Anticoagulation Episode Summary    Current INR goal 2.0-3.0  Next INR check 10/14/2014  INR from last check 2.0 (09/16/2014)  Weekly max dose   Target end date Indefinite  [facility] check [facility]  Preferred lab   Send INR reminders to ANTICOAG [facility]   Indications  Pulmonary embolism [I26.99] Encounter for long-term use of antiplatelets/antithrombotics [Z79.02]        Comments History of multiple venous embolic episodes. Will continue to annual re-evaluate continued need for warfarin weighing risks vs. benefits.       Anticoagulation Care Providers    Provider Role Specialty Phone number   Axel Filler, MD  Internal Medicine [phone]     PATIENT INSTRUCTIONS: Patient Instructions  Patient instructed to take medications as defined in the Anti-coagulation Track section of this encounter.  Patient instructed to take today's dose.  Patient verbalized understanding of these instructions.       FOLLOW-UP Return in about 4 weeks (around 10/14/2014) for Follow up INR on 10/14/14 at 1000.  [name],  PharmD BCPS, BCACP

## 2014-09-18 ENCOUNTER — Other Ambulatory Visit: Payer: Self-pay | Admitting: Neurology

## 2014-09-18 NOTE — Telephone Encounter (Signed)
Last seen by Jeani Hawking.  Has appt with Dr Leonie Man

## 2014-09-30 ENCOUNTER — Ambulatory Visit (INDEPENDENT_AMBULATORY_CARE_PROVIDER_SITE_OTHER): Payer: Commercial Managed Care - HMO | Admitting: Internal Medicine

## 2014-09-30 ENCOUNTER — Emergency Department (HOSPITAL_COMMUNITY)
Admission: EM | Admit: 2014-09-30 | Discharge: 2014-09-30 | Discharge status: Against Medical Advice | Payer: Commercial Managed Care - HMO | Attending: Emergency Medicine | Admitting: Emergency Medicine

## 2014-09-30 ENCOUNTER — Telehealth: Payer: Self-pay | Admitting: *Deleted

## 2014-09-30 ENCOUNTER — Encounter: Payer: Self-pay | Admitting: Internal Medicine

## 2014-09-30 VITALS — BP 121/82 | HR 73 | Temp 97.7°F | Wt 160.7 lb

## 2014-09-30 DIAGNOSIS — I10 Essential (primary) hypertension: Secondary | ICD-10-CM | POA: Insufficient documentation

## 2014-09-30 DIAGNOSIS — Z5329 Procedure and treatment not carried out because of patient's decision for other reasons: Secondary | ICD-10-CM | POA: Diagnosis not present

## 2014-09-30 DIAGNOSIS — G4710 Hypersomnia, unspecified: Secondary | ICD-10-CM | POA: Diagnosis not present

## 2014-09-30 DIAGNOSIS — G309 Alzheimer's disease, unspecified: Secondary | ICD-10-CM | POA: Diagnosis not present

## 2014-09-30 DIAGNOSIS — R400 Somnolence: Secondary | ICD-10-CM

## 2014-09-30 LAB — POCT GLYCOSYLATED HEMOGLOBIN (HGB A1C): Hemoglobin A1C: 6

## 2014-09-30 NOTE — ED Notes (Signed)
Pt did not answer.

## 2014-09-30 NOTE — Telephone Encounter (Signed)
Pt will come in today at 3:45 Daughter will arrange. If any problems I advised ED

## 2014-09-30 NOTE — Progress Notes (Signed)
Internal Medicine Clinic Attending  Case discussed with Dr. Mallory at the time of the visit.  We reviewed the resident's history and exam and pertinent patient test results.  I agree with the assessment, diagnosis, and plan of care documented in the resident's note. 

## 2014-09-30 NOTE — ED Notes (Signed)
Pt did not answer x 2 

## 2014-09-30 NOTE — Patient Instructions (Addendum)
Your appointment at [facility] is currently scheduled for 2/3. If your sleepiness gets better, just keep this appointment. If you continue to experience increased sleepiness, please call [facility] and schedule the appointment this week for assessment of the increased sleepiness, but also for follow up of Alzheimer's and parkinsonianism.  If labs are abnormal, we will pass them along to the Neurologist and will call you.  Please encourage food and drink intake today.  General Instructions:   Please bring your medicines with you each time you come to clinic.  Medicines may include prescription medications, over-the-counter medications, herbal remedies, eye drops, vitamins, or other pills.   Progress Toward Treatment Goals:  Treatment Goal 04/11/2013  Blood pressure at goal  Prevent falls improved    Self Care Goals & Plans:  Self Care Goal 04/18/2014  Manage my medications take my medicines as prescribed; bring my medications to every visit; refill my medications on time; follow the sick day instructions if I am sick  Monitor my health keep track of my blood pressure  Eat healthy foods eat more vegetables; eat fruit for snacks and desserts; eat baked foods instead of fried foods; eat smaller portions  Be physically active find an activity I enjoy  Prevent falls -  Meeting treatment goals -    No flowsheet data found.   Care Management & Community Referrals:  Referral 01/25/2013  Referrals made to community resources exercise/physical therapy

## 2014-09-30 NOTE — Telephone Encounter (Signed)
He needs to be seen and eval today. Either [facility] or ED.

## 2014-09-30 NOTE — Telephone Encounter (Signed)
Call from family member called with concern for patient. Patient has been sleeping since 3:00 PM yesterday.  They are able wake him up but he goes back to sleep.  They checked vitals and they are normal. They don't think he has taken anything that would make him sleepy.  I called daughter back, [name].  She can wake pt up and he will talk to them.  No slurred speech noted. Yesterday he was more sleepy than normal. He is able to to get up and go to bathroom and eat but goes back to sleep. This is a change for him.  # [phone]

## 2014-09-30 NOTE — ED Notes (Signed)
The pt was never in contact with ER staff

## 2014-09-30 NOTE — Progress Notes (Signed)
   Subjective:    Patient ID: [name] [name], male    DOB: 1931-02-28, 81 y.o.   MRN: [id number]  HPI [name] is an 81 year old male with a PMH of Alzheimer's dementia, DM2, HTN, DJD and chronic anticoagulation secondary to repeated PEs (2010 and 2011) who is presenting at the encouragement of his daughter after his home health nurse noticed increased sleepiness over the past day. His daughter visited the patient yesterday and again today and found that he was napping more than usual. He also slept through the night last night (atypical for him). His medications have not changed and he has had no recent cough and no symptoms of UTI or other infection.   Once [name]'s daughter picked him up this afternoon, he was back to his baseline level of consciousness.   [name] was previously taking nortriptyline and tramadol, but has not taken these in months. He is on no other sedating medications.   Review of Systems  Constitutional: Positive for activity change and fatigue. Negative for fever, chills, diaphoresis, appetite change and unexpected weight change.  HENT: Positive for hearing loss. Negative for congestion.   Eyes: Negative.   Respiratory: Negative for shortness of breath and wheezing.   Cardiovascular: Negative for chest pain, palpitations and leg swelling.  Gastrointestinal: Negative for vomiting, abdominal pain, diarrhea and constipation.  Genitourinary: Negative for dysuria, frequency, hematuria and flank pain.       No incontinence  Musculoskeletal: Positive for gait problem. Negative for neck stiffness.       History of difficulty initiating movement  Skin: Negative for rash and wound.  Neurological: Negative for dizziness, seizures, syncope, weakness, light-headedness, numbness and headaches.       Objective:   Physical Exam  Constitutional: He is oriented to person, place, and time. He appears well-developed and well-nourished. No distress.  Does not  appear lethargic, somnolent, or even tired throughout exam  HENT:  Head: Normocephalic and atraumatic.  Right Ear: External ear normal.  Left Ear: External ear normal.  No evidence of tongue biting  Eyes: Conjunctivae and EOM are normal. Pupils are equal, round, and reactive to light. Right eye exhibits no discharge.  Neck: Normal range of motion.  Cardiovascular: Normal rate, regular rhythm and normal heart sounds.   No murmur heard. Pulmonary/Chest: Effort normal and breath sounds normal. He has no wheezes.  Abdominal: Soft. Bowel sounds are normal. There is no tenderness.  Musculoskeletal: Normal range of motion. He exhibits no edema or tenderness.  Neurological: He is alert and oriented to person, place, and time. No cranial nerve deficit.  Slow to answer questions, but able to calculate (5 quarters in $1.25) Able to count backwards from 10 Sensation and strength full throughout Coordination intact Gait slow but otherwise normal           Assessment & Plan:  Please see problem oriented charting for assessment & plan  Venita Lick, MD

## 2014-10-01 DIAGNOSIS — R400 Somnolence: Secondary | ICD-10-CM | POA: Insufficient documentation

## 2014-10-01 LAB — TSH: TSH: 0.735 u[IU]/mL (ref 0.350–4.500)

## 2014-10-01 LAB — GLUCOSE, CAPILLARY: GLUCOSE-CAPILLARY: 106 mg/dL — AB (ref 70–99)

## 2014-10-01 NOTE — Assessment & Plan Note (Addendum)
Patient's daughter reports that he has been somnolent at home over the past 24 hours to the point that his PO intake has been decreased. No recent medication changes; no current sedating medications, no trauma. Patient has a history of thalamic infarct and other Alzheimer's dementia along with parkinsonianism. Has not been to neurology recently, but has an appointment on 10/23/14 with Dr. Leonie Man. No focal neurologic deficits on exam and patient not somnolent (or even tired-appearing) on exam. No history of seizure, no observed seizure-activity. Daughter believes that he could have just been exhausted from the weekend and says that during office visit, he was back to basleine.  - TSH - Continue to monitor at home; if somnolence continues, daughter will call Neurology to move appointment up (otherwise, will keep 10/23/14 appointment) - Encourage PO intake

## 2014-10-07 ENCOUNTER — Other Ambulatory Visit: Payer: Self-pay | Admitting: Internal Medicine

## 2014-10-09 ENCOUNTER — Telehealth: Payer: Self-pay | Admitting: Pharmacist

## 2014-10-09 ENCOUNTER — Encounter: Payer: Self-pay | Admitting: Internal Medicine

## 2014-10-09 ENCOUNTER — Ambulatory Visit (INDEPENDENT_AMBULATORY_CARE_PROVIDER_SITE_OTHER): Payer: Commercial Managed Care - HMO | Admitting: Internal Medicine

## 2014-10-09 VITALS — BP 125/74 | HR 77 | Temp 97.9°F | Ht 66.0 in | Wt 160.6 lb

## 2014-10-09 DIAGNOSIS — Z7902 Long term (current) use of antithrombotics/antiplatelets: Secondary | ICD-10-CM

## 2014-10-09 DIAGNOSIS — Z7901 Long term (current) use of anticoagulants: Secondary | ICD-10-CM

## 2014-10-09 DIAGNOSIS — I10 Essential (primary) hypertension: Secondary | ICD-10-CM | POA: Diagnosis not present

## 2014-10-09 DIAGNOSIS — Z86718 Personal history of other venous thrombosis and embolism: Secondary | ICD-10-CM

## 2014-10-09 DIAGNOSIS — G309 Alzheimer's disease, unspecified: Secondary | ICD-10-CM

## 2014-10-09 DIAGNOSIS — F0280 Dementia in other diseases classified elsewhere without behavioral disturbance: Secondary | ICD-10-CM | POA: Diagnosis not present

## 2014-10-09 DIAGNOSIS — K5900 Constipation, unspecified: Secondary | ICD-10-CM | POA: Diagnosis not present

## 2014-10-09 DIAGNOSIS — Z0000 Encounter for general adult medical examination without abnormal findings: Secondary | ICD-10-CM

## 2014-10-09 LAB — POCT INR: INR: 1.9

## 2014-10-09 MED ORDER — ZOSTER VACCINE LIVE 19400 UNT/0.65ML ~~LOC~~ SOLR: 0.6500 mL | Freq: Once | SUBCUTANEOUS | Status: DC

## 2014-10-09 NOTE — Telephone Encounter (Signed)
Called patient's daughter [name] and gave instruction relative to today's INR = 1.9 on 35mg  warfarin/week. Increased to 40mg /wk (7.5mg  on Wed/Sun; 5mg  [as 1&1/2 x 5mg  on Sun/Thur]; 1 x 5mg  all other days) next INR 8-FEB-16.n Daughter indicates there are no signs or symptoms of bleeding and no new medications. May have been one missed dose but not sure. That notwithstanding, INR has been trending down on 35mg /wk warfarin for past 2 visits (2.5 and 2.1). Will increase dose. Explained to daughter. She verbalizes understanding of these instructions and dosing rationale.

## 2014-10-09 NOTE — Patient Instructions (Signed)
1. Please return in 3 months or sooner if needed.    2. Please take all medications as prescribed.    3. If you have worsening of your symptoms or new symptoms arise, please call the clinic ([phone]), or go to the ER immediately if symptoms are severe.

## 2014-10-09 NOTE — Progress Notes (Signed)
   Subjective:    Patient ID: [name] [name], male    DOB: 1931-07-12, 81 y.o.   MRN: [id number]  HPI Comments: [name] [name] is an 81 year old male with a PMH of Alzheimer's, hx of VTE now on coumadin, DM, HTN and DJD here for handicap placard form.  The parking form was signed and given to the patient at today's visit.  He qualifies based on memory impairment and limitations on gait and walking speed.     Review of Systems  Constitutional: Negative for fever, chills, appetite change and unexpected weight change.  Respiratory: Negative for shortness of breath.   Cardiovascular: Negative for chest pain, palpitations and leg swelling.  Gastrointestinal: Positive for constipation. Negative for nausea, vomiting, abdominal pain, diarrhea and blood in stool.       Occasional constipation.  Genitourinary: Negative for dysuria and hematuria.  Neurological: Negative for syncope and light-headedness.       Filed Vitals:   10/09/14 1509  BP: 125/74  Pulse: 77  Temp: 97.9 F (36.6 C)     Objective:   Physical Exam  Constitutional: He is oriented to person, place, and time. He appears well-developed. No distress.  HENT:  Head: Normocephalic and atraumatic.  Neck: Neck supple.  Cardiovascular: Normal rate, regular rhythm and normal heart sounds.  Exam reveals no gallop and no friction rub.   No murmur heard. Pulmonary/Chest: Effort normal and breath sounds normal. No respiratory distress. He has no wheezes. He has no rales.  Abdominal: Soft. Bowel sounds are normal. He exhibits no distension. There is no tenderness. There is no rebound.  Musculoskeletal: Normal range of motion. He exhibits no edema or tenderness.  Neurological: He is alert and oriented to person, place, and time.  Skin: Skin is warm. He is not diaphoretic.  Psychiatric: He has a normal mood and affect. His behavior is normal.  Vitals reviewed.         Assessment & Plan:  Please see problem based assessment and  plan.

## 2014-10-09 NOTE — Assessment & Plan Note (Addendum)
BP Readings from Last 3 Encounters:  10/09/14 125/74  09/30/14 121/82  04/18/14 122/86    Lab Results  Component Value Date   NA 141 01/31/2014   K 4.2 01/31/2014   CREATININE 1.10 01/31/2014    Assessment: Blood pressure control:  well controlled Progress toward BP goal:   at goal Comments: Lives with daughter and reports compliance with all meds.  Plan: Medications:  continue current medications:  Amlodipine 10mg  daily, lisinopril 40mg  daily Educational resources provided: brochure (denies) Self management tools provided: home blood pressure logbook (denies) Other plans: RTC in 3 months.  BMP next visit.

## 2014-10-09 NOTE — Assessment & Plan Note (Signed)
Occasional constipation.  No straining or blood in stool.  I advised he increase water and fiber in diet.  I advised he try miralax or psyllium prn.

## 2014-10-10 DIAGNOSIS — Z0000 Encounter for general adult medical examination without abnormal findings: Secondary | ICD-10-CM | POA: Insufficient documentation

## 2014-10-10 NOTE — Progress Notes (Signed)
Medicine attending: Medical history, presenting problems, physical findings, and medications, reviewed with resident physician Dr [name] on the day of the patient's visit and I concur with her evaluation and management plan.

## 2014-10-10 NOTE — Assessment & Plan Note (Signed)
Daughter notes only minimal change with Namenda.  The patient follows with Dr. Leonie Man and will see him next month.

## 2014-10-10 NOTE — Assessment & Plan Note (Signed)
Rx for shingles vaccine provided. 

## 2014-10-14 ENCOUNTER — Other Ambulatory Visit: Payer: Self-pay | Admitting: Internal Medicine

## 2014-10-23 ENCOUNTER — Encounter: Payer: Self-pay | Admitting: Neurology

## 2014-10-23 ENCOUNTER — Ambulatory Visit (INDEPENDENT_AMBULATORY_CARE_PROVIDER_SITE_OTHER): Payer: Commercial Managed Care - HMO | Admitting: Neurology

## 2014-10-23 VITALS — BP 114/77 | HR 71 | Ht 66.0 in | Wt 159.8 lb

## 2014-10-23 DIAGNOSIS — F0280 Dementia in other diseases classified elsewhere without behavioral disturbance: Secondary | ICD-10-CM

## 2014-10-23 DIAGNOSIS — G309 Alzheimer's disease, unspecified: Secondary | ICD-10-CM

## 2014-10-23 NOTE — Patient Instructions (Signed)
I had a long discussion with the patient and his daughter about his dementia and cognitive impairment and answered questions. Continue Namenda XR. 28 mg daily. He has had trouble tolerating Aricept in the past. I also counseled him about fall and safety precautions. He was advised to increase nortriptyline to 20 mg at night to help him sleep. Continue warfarin for stroke prevention with strict control of hypertension with blood pressure goal below 130/90. He was also encouraged to continue Sinemet for mild parkinsonism and gait difficulties and also to increase participation in cognitively challenging activities and get daily exercise.

## 2014-10-23 NOTE — Progress Notes (Signed)
[name] DOB: 1931-05-05  REASON FOR VISIT: routine follow up for Alzheimer's and Parkinson's HISTORY FROM: patient  HISTORY OF PRESENT ILLNESS: UPDATE 01/23/14 (LL):  MMSE today is 23/30, AFT 9 and Clock drawing 1/4. He is tolerating Sinemet tid and Namenda well.  Mood is better on Zoloft. Having difficulty sleeping.  Does not nap during the day.  Has difficulty getting to sleep and wakes frequently.  Has tried Melatonin in the past without benefit.  He used Nortriptyline in the past for insomnia and pain, but at 100 mg dose became toxic (unresponsive).  Daughter notices "sundowning" at night, more confused in nighttime hours.   UPDATE 06/23/13 (LL): [name] comes back for 2 month revisit. Last visit he was started on Sinemet to see if he would have any benefit. Both he and his daughter think that he is moving better since on it. Daughter states that he is no longer "freezing" when he tries to take a step. He is currently taking it twice a day, once when he gets up (around 7-8) and then again at 5 pm. He has no new complaints. MMSE today is 27/30, AFT 8 and Clock drawing 1/4.   UPDATE 05/24/13 (LL): Patient returns for followup. After starting Aricept at last visit daughter reports that he became very weak, confused, and had to be hospitalized. Was found to be bradycardic and symptomatic with heart rate in the 40s, attributable to the Aricept. Medication was stopped and patient was discharged to rehabilitation for short time and has since returned home. Daughter states that he has adjusted somewhat to having moved in with her, is taking Zoloft for depression, and has disinterest in getting out of the house much. States that sometimes it is hard for him to pick up his foot to take a step and it seems to be when he is nervous. Also seems to have tremor when nervous. Has had trouble with insomnia was started on melatonin 5 mg at bedtime with no response. Daughter feels like he has  improved some in his cognitive ability since last visit. MMSE last visit was 23/30. MMSE today is 25/30 with deficits in recall. Clock drawing 0/4. AFT 10. Geriatric depression score is 3 does not suggest depression.   PRIOR HPI 02/05/13 (PS): 80 male with remote right hemispheric TIA in May 2011 and mild cognitive impairment. He returns today for followup after his last visit with me on 01/05/2012. Continues to do well from a neurovascular standpoint and has not had a any further stroke or TIAs. He remains on warfarin and is tolerating it well without significant bleeding, bruising or the side effects. He states his blood pressure is under good control and it is 124/76 in office today. He however is had increasing confusion and memory difficulties for the last 1 year which have been progressive. He now needs help with taking his medications. He has stopped going out. He in fact wandered out a few weeks ago from his home and needed help to get back. He denies any agitation or caval changes. He has no significant hallucination or other lesions. His gait and balance are fine and there have been no safety issues. He has now moved and to live with his daughter for the last 2-3 months. The patient did undergo labs for treatable causes of cognitive impairment at last visit and vitamin B12, TSH and RPR were normal on 01/05/2012. His mother did have Alzheimer's.  Update 10/23/2014 : He returns for follow-up after  last visit 9 months ago. He is accompanied by his daughter who provides most of the history. Patient feels he is about the same and the daughter agrees. He continues to have poor short-term memory and needs help with activities like his medications and food. He can dress himself and uses a toilet on most occasions with only occasional help. He sometimes has trouble walking and states that his legs don't move but once he gets started he can walk fairly well. He walks with a stooped posture but his balance is not bad  and he is not had any recent falls. He remains on Namenda XR 28 mg daily which he is tolerating well. He had been on Aricept for years but developed slow heart rate requiring it to be stopped. He has had no interval health problems. He hasn't not been having any significant hallucinations, delusions, agitation or safety concerns. He does get anxious occasionally. The patient's other daughter wants him and his wife to come and stay with her for 3 months and the family is contemplating whether that would be a good move or not REVIEW OF SYSTEMS: Full 14 system review of systems performed and notable only for:  Ringing in ears, Insomnia, Confusion   ALLERGIES: Allergies  Allergen Reactions  . Aricept [Donepezil Hcl] Other (See Comments)    Symptomatic Bradycardia.  . Other Other (See Comments)    Shrimp gives him gout    HOME MEDICATIONS: Outpatient Prescriptions Prior to Visit  Medication Sig Dispense Refill  . amLODipine (NORVASC) 10 MG tablet TAKE 1 TABLET BY MOUTH DAILY 30 tablet 1  . atorvastatin (LIPITOR) 40 MG tablet Take 1 tablet (40 mg total) by mouth every evening. 90 tablet 1  . b complex vitamins tablet Take 1 tablet by mouth daily.    . Calcium Carbonate-Vitamin D (CALCIUM-VITAMIN D) 500-200 MG-UNIT per tablet Take 1 tablet by mouth daily.    . carbidopa-levodopa (SINEMET IR) 25-100 MG per tablet Take 1 tablet by mouth 3 (three) times daily. 270 tablet 0  . fish oil-omega-3 fatty acids 1000 MG capsule Take 2 g by mouth daily.     [name] lisinopril (PRINIVIL,ZESTRIL) 40 MG tablet Take 1 tablet (40 mg total) by mouth daily. 90 tablet 3  . Multiple Vitamins-Minerals (MULTIVITAMIN WITH MINERALS) tablet Take 1 tablet by mouth daily.    [name] NAMENDA XR 28 MG CP24 Take 28 mg by mouth daily. 30 capsule 12  . omeprazole (PRILOSEC) 20 MG capsule Take 1 capsule (20 mg total) by mouth daily. 90 capsule 1  . warfarin (COUMADIN) 5 MG tablet TAKE 1 TABLET (5 MG TOTAL) BY MOUTH DAILY. 30 tablet 1  .  zoster vaccine live, PF, (ZOSTAVAX) 42683 UNT/0.65ML injection Inject 19,400 Units into the skin once. 1 each 0   No facility-administered medications prior to visit.   PHYSICAL EXAM  Filed Vitals:   10/23/14 1447  BP: 114/77  Pulse: 71  Height: 5\' 6"  (1.676 m)  Weight: 159 lb 12.5 oz (72.476 kg)   Body mass index is 25.8 kg/(m^2).  Generalized: In no acute distress, pleasant  Frail elderly AA male, well groomed, well developed  Neck: Supple, no carotid bruits  Cardiac: Irregular rate and rhythm, soft 1/6 murmur  Pulmonary: Clear to auscultation bilaterally  Musculoskeletal: mild kyphosis   Neurological examination  MMSE history: Today 10/23/14 MMSE 22/30, AFT 11, Clock 1/4, GDS 5 01/23/14:   MMSE 23/30, recall 1/3, AFT 9 and Clock Drawing 1/4. 06/23/13:  MMSE 27/30, recall 3/3,  AFT 8 and Clock drawing 1/4. GDS 5. 02/05/13:  MMSE 25/30, recall 1/3, AFT 10, Clock drawing 0/4. GDS 3.  Mentation: Alert, language fluent.  Mood and affect appropriate. MASKED FACIES.   Cranial nerve II-XII: Pupils were equal round reactive to light extraocular movements were full, visual field were full on confrontational test. facial sensation and strength were normal. hearing was intact to finger rubbing bilaterally. Uvula tongue midline.   MOTOR: normal bulk and tone, full strength in the BUE, BLE, fine finger movements normal, no pronator drift. MILD COGWHEEL RIGIDIITY IN ARMS AND WRISTS, L>R.  SENSORY: normal and symmetric to light touch  COORDINATION: Normal finger-nose-finger, heel-to-shin bilaterally.  REFLEXES: 1+ and symmetric. POSITIVE MYERSON'S, POSITIVE SNOUT, NEGATIVE PALMOMENTAL.  GAIT/STATION: Rising up from seated position without assistance, STOOPED STANCE, without trunk ataxia, moderate stride, POOR ARM SWING ON RIGHT, TURNS IN 3 STEPS, UNABLE to perform tiptoe, and heel walking without difficulty. Romberg negative.  ASSESSMENT AND PLAN 81 year old AA male patient with mild cognitive  impairment which appears to progressed to mild dementia. Likely Alzheimer`s type. Remote history of right brain TIA in May 2011 and stable from a neurovascular standpoint. Aricept caused bradycardia. Moving easier and more steady on Sinemet.   PLAN:  I had a long discussion with the patient and his daughter about his dementia and cognitive impairment and answered questions. Continue Namenda XR. 28 mg daily. He has had trouble tolerating Aricept in the past. I also counseled him about fall and safety precautions. He was advised to increase nortriptyline to 20 mg at night to help him sleep. Continue warfarin for stroke prevention with strict control of hypertension with blood pressure goal below 130/90. He was also encouraged to continue Sinemet for mild parkinsonism and gait difficulties and also to increase participation in cognitively challenging activities and get daily exercise. [name], MD  10/23/2014, 3:27 PM [facility] [address] [place], [place] [address] [phone]  Note: This document was prepared with digital dictation and possible smart phrase technology. Any transcriptional errors that result from this process are unintentional.

## 2014-11-04 ENCOUNTER — Ambulatory Visit: Payer: Medicare HMO

## 2014-11-11 ENCOUNTER — Ambulatory Visit (INDEPENDENT_AMBULATORY_CARE_PROVIDER_SITE_OTHER): Payer: Commercial Managed Care - HMO | Admitting: Pharmacist

## 2014-11-11 DIAGNOSIS — Z7902 Long term (current) use of antithrombotics/antiplatelets: Secondary | ICD-10-CM | POA: Diagnosis not present

## 2014-11-11 DIAGNOSIS — I2699 Other pulmonary embolism without acute cor pulmonale: Secondary | ICD-10-CM

## 2014-11-11 LAB — POCT INR: INR: 2.5

## 2014-11-11 NOTE — Progress Notes (Signed)
Anti-Coagulation Progress Note  [name] [name] is a 81 y.o. male who is currently on an anti-coagulation regimen.    RECENT RESULTS: Recent results are below, the most recent result is correlated with a dose of 35 mg. per week: Lab Results  Component Value Date   INR 2.50 11/11/2014   INR 1.9 10/09/2014   INR 2.0 09/16/2014    ANTI-COAG DOSE: Anticoagulation Dose Instructions as of 11/11/2014      [name] Tue Wed Thu Fri Sat   New Dose 7.5 mg 5 mg 5 mg 7.5 mg 5 mg 5 mg 5 mg       ANTICOAG SUMMARY: Anticoagulation Episode Summary    Current INR goal 2.0-3.0  Next INR check 11/25/2014  INR from last check 2.50 (11/11/2014)  Weekly max dose   Target end date Indefinite  [facility] check [facility]  Preferred lab   Send INR reminders to ANTICOAG IMP   Indications  Pulmonary embolism [I26.99] Encounter for long-term use of antiplatelets/antithrombotics [Z79.02]        Comments History of multiple venous embolic episodes. Will continue to annual re-evaluate continued need for warfarin weighing risks vs. benefits.       Anticoagulation Care Providers    Provider Role Specialty Phone number   Axel Filler, MD  Internal Medicine [phone]      ANTICOAG TODAY: Anticoagulation Summary as of 11/11/2014    INR goal 2.0-3.0  Selected INR 2.50 (11/11/2014)  Next INR check 11/25/2014  Target end date Indefinite   Indications  Pulmonary embolism [I26.99] Encounter for long-term use of antiplatelets/antithrombotics [Z79.02]      Anticoagulation Episode Summary    [facility] check [facility]   Preferred lab    Send INR reminders to ANTICOAG IMP   Comments History of multiple venous embolic episodes. Will continue to annual re-evaluate continued need for warfarin weighing risks vs. benefits.     Anticoagulation Care Providers    Provider Role Specialty Phone number   Axel Filler, MD  Internal Medicine [phone]      PATIENT INSTRUCTIONS: Patient  Instructions  Patient instructed to take medications as defined in the Anti-coagulation Track section of this encounter.  Patient instructed to take today's dose.  Patient verbalized understanding of these instructions.       FOLLOW-UP Return in 2 weeks (on 11/25/2014) for Follow up INR at 1000h.  Jorene Guest, [name] Pharm.D., CACP

## 2014-11-11 NOTE — Patient Instructions (Signed)
Patient instructed to take medications as defined in the Anti-coagulation Track section of this encounter.  Patient instructed to take today's dose.  Patient verbalized understanding of these instructions.    

## 2014-11-13 NOTE — Progress Notes (Signed)
INTERNAL MEDICINE TEACHING ATTENDING ADDENDUM - Aldine Contes M.D  Duration- indefinite, Indication- PE, INR- therapeutic. Agree with Dr. Gladstone Pih recommendations as outlined in his note.

## 2014-11-22 ENCOUNTER — Telehealth: Payer: Self-pay | Admitting: Pharmacist

## 2014-11-22 NOTE — Telephone Encounter (Signed)
Call to patient to confirm appointment for 11/25/14 at 10:00 lmtcb

## 2014-11-25 ENCOUNTER — Ambulatory Visit: Payer: Medicare HMO

## 2014-11-25 ENCOUNTER — Other Ambulatory Visit: Payer: Self-pay | Admitting: Internal Medicine

## 2014-11-25 ENCOUNTER — Ambulatory Visit (INDEPENDENT_AMBULATORY_CARE_PROVIDER_SITE_OTHER): Payer: Commercial Managed Care - HMO | Admitting: Pharmacist

## 2014-11-25 DIAGNOSIS — Z7901 Long term (current) use of anticoagulants: Secondary | ICD-10-CM

## 2014-11-25 DIAGNOSIS — Z7902 Long term (current) use of antithrombotics/antiplatelets: Secondary | ICD-10-CM

## 2014-11-25 DIAGNOSIS — I2699 Other pulmonary embolism without acute cor pulmonale: Secondary | ICD-10-CM | POA: Diagnosis not present

## 2014-11-25 LAB — POCT INR: INR: 2.2

## 2014-11-25 NOTE — Progress Notes (Signed)
  Anti-Coagulation Progress Note [name] [name] is a 81 y.o. male who is currently on an anti-coagulation regimen.  RECENT RESULTS: Recent results are below, the most recent result is correlated with a dose of 40 mg. per week: Lab Results  Component Value Date   INR 2.2 11/25/2014   INR 2.50 11/11/2014   INR 1.9 10/09/2014   ANTI-COAG DOSE: Anticoagulation Dose Instructions as of 11/25/2014      [name] Tue Wed Thu Fri Sat   New Dose 7.5 mg 5 mg 5 mg 7.5 mg 5 mg 5 mg 5 mg      ANTICOAG SUMMARY: Anticoagulation Episode Summary    Current INR goal 2.0-3.0  Next INR check 12/16/2014  INR from last check 2.2 (11/25/2014)  Weekly max dose   Target end date Indefinite  [facility]  Preferred lab   Send INR reminders to ANTICOAG IMP   Indications  Pulmonary embolism [I26.99] Encounter for long-term use of antiplatelets/antithrombotics [Z79.02]        Comments History of multiple venous embolic episodes. Will continue to annual re-evaluate continued need for warfarin weighing risks vs. benefits.       Anticoagulation Care Providers    Provider Role Specialty Phone number   Bertha Stakes, MD  Internal Medicine [phone]     ANTICOAG TODAY: Anticoagulation Summary as of 11/25/2014    INR goal 2.0-3.0  Selected INR 2.2 (11/25/2014)  Next INR check 12/16/2014  Target end date Indefinite   Indications  Pulmonary embolism [I26.99] Encounter for long-term use of antiplatelets/antithrombotics [Z79.02]      Anticoagulation Episode Summary    [facility] check [facility]   Preferred lab    Send INR reminders to ANTICOAG IMP   Comments History of multiple venous embolic episodes. Will continue to annual re-evaluate continued need for warfarin weighing risks vs. benefits.     Anticoagulation Care Providers    Provider Role Specialty Phone number   Bertha Stakes, MD  Internal Medicine [phone]     PATIENT INSTRUCTIONS: Patient Instructions  Patient  instructed to take medications as defined in the Anti-coagulation Track section of this encounter. Patient instructed to take today's dose. Patient verbalized understanding of these instructions.    FOLLOW-UP Return in about 3 weeks (around 12/16/2014) for Follow-up INR at 1000am.  Milus Glazier, PharmD Clinical Pharmacy Resident Pager: [phone] 11/25/2014 3:16 PM

## 2014-11-25 NOTE — Patient Instructions (Signed)
Patient instructed to take medications as defined in the Anti-coagulation Track section of this encounter.  Patient instructed to take today's dose.  Patient verbalized understanding of these instructions.    

## 2014-12-02 ENCOUNTER — Other Ambulatory Visit: Payer: Self-pay | Admitting: Internal Medicine

## 2014-12-07 ENCOUNTER — Inpatient Hospital Stay (HOSPITAL_COMMUNITY)
Admission: EM | Admit: 2014-12-07 | Discharge: 2014-12-18 | DRG: 287 | Disposition: A | Discharge status: Home Health | Payer: Commercial Managed Care - HMO | Attending: Cardiology | Admitting: Cardiology

## 2014-12-07 ENCOUNTER — Encounter (HOSPITAL_COMMUNITY): Payer: Self-pay | Admitting: Emergency Medicine

## 2014-12-07 ENCOUNTER — Emergency Department (HOSPITAL_COMMUNITY): Payer: Commercial Managed Care - HMO

## 2014-12-07 ENCOUNTER — Other Ambulatory Visit (HOSPITAL_COMMUNITY): Payer: Self-pay

## 2014-12-07 DIAGNOSIS — Z5181 Encounter for therapeutic drug level monitoring: Secondary | ICD-10-CM | POA: Diagnosis not present

## 2014-12-07 DIAGNOSIS — Z86711 Personal history of pulmonary embolism: Secondary | ICD-10-CM | POA: Diagnosis not present

## 2014-12-07 DIAGNOSIS — I10 Essential (primary) hypertension: Secondary | ICD-10-CM | POA: Diagnosis present

## 2014-12-07 DIAGNOSIS — F0280 Dementia in other diseases classified elsewhere without behavioral disturbance: Secondary | ICD-10-CM | POA: Diagnosis present

## 2014-12-07 DIAGNOSIS — I440 Atrioventricular block, first degree: Secondary | ICD-10-CM | POA: Diagnosis present

## 2014-12-07 DIAGNOSIS — R001 Bradycardia, unspecified: Principal | ICD-10-CM | POA: Diagnosis present

## 2014-12-07 DIAGNOSIS — I209 Angina pectoris, unspecified: Secondary | ICD-10-CM

## 2014-12-07 DIAGNOSIS — I252 Old myocardial infarction: Secondary | ICD-10-CM | POA: Diagnosis not present

## 2014-12-07 DIAGNOSIS — I2510 Atherosclerotic heart disease of native coronary artery without angina pectoris: Secondary | ICD-10-CM | POA: Diagnosis present

## 2014-12-07 DIAGNOSIS — Z79899 Other long term (current) drug therapy: Secondary | ICD-10-CM

## 2014-12-07 DIAGNOSIS — R404 Transient alteration of awareness: Secondary | ICD-10-CM | POA: Diagnosis not present

## 2014-12-07 DIAGNOSIS — D352 Benign neoplasm of pituitary gland: Secondary | ICD-10-CM | POA: Diagnosis not present

## 2014-12-07 DIAGNOSIS — E785 Hyperlipidemia, unspecified: Secondary | ICD-10-CM | POA: Diagnosis not present

## 2014-12-07 DIAGNOSIS — I2699 Other pulmonary embolism without acute cor pulmonale: Secondary | ICD-10-CM | POA: Diagnosis not present

## 2014-12-07 DIAGNOSIS — I959 Hypotension, unspecified: Secondary | ICD-10-CM | POA: Diagnosis not present

## 2014-12-07 DIAGNOSIS — Z8249 Family history of ischemic heart disease and other diseases of the circulatory system: Secondary | ICD-10-CM | POA: Diagnosis not present

## 2014-12-07 DIAGNOSIS — E119 Type 2 diabetes mellitus without complications: Secondary | ICD-10-CM | POA: Diagnosis present

## 2014-12-07 DIAGNOSIS — I6782 Cerebral ischemia: Secondary | ICD-10-CM | POA: Diagnosis not present

## 2014-12-07 DIAGNOSIS — Z8546 Personal history of malignant neoplasm of prostate: Secondary | ICD-10-CM | POA: Diagnosis not present

## 2014-12-07 DIAGNOSIS — R55 Syncope and collapse: Secondary | ICD-10-CM | POA: Diagnosis present

## 2014-12-07 DIAGNOSIS — Q245 Malformation of coronary vessels: Secondary | ICD-10-CM

## 2014-12-07 DIAGNOSIS — I4510 Unspecified right bundle-branch block: Secondary | ICD-10-CM | POA: Diagnosis not present

## 2014-12-07 DIAGNOSIS — Z7901 Long term (current) use of anticoagulants: Secondary | ICD-10-CM | POA: Diagnosis not present

## 2014-12-07 DIAGNOSIS — Z7982 Long term (current) use of aspirin: Secondary | ICD-10-CM

## 2014-12-07 DIAGNOSIS — R52 Pain, unspecified: Secondary | ICD-10-CM

## 2014-12-07 DIAGNOSIS — Z9079 Acquired absence of other genital organ(s): Secondary | ICD-10-CM | POA: Diagnosis present

## 2014-12-07 DIAGNOSIS — J9811 Atelectasis: Secondary | ICD-10-CM | POA: Diagnosis not present

## 2014-12-07 DIAGNOSIS — G309 Alzheimer's disease, unspecified: Secondary | ICD-10-CM | POA: Diagnosis not present

## 2014-12-07 DIAGNOSIS — R079 Chest pain, unspecified: Secondary | ICD-10-CM | POA: Diagnosis not present

## 2014-12-07 DIAGNOSIS — Z823 Family history of stroke: Secondary | ICD-10-CM | POA: Diagnosis not present

## 2014-12-07 DIAGNOSIS — R401 Stupor: Secondary | ICD-10-CM | POA: Diagnosis not present

## 2014-12-07 LAB — COMPREHENSIVE METABOLIC PANEL
ALK PHOS: 42 U/L (ref 39–117)
ALT: 7 U/L (ref 0–53)
AST: 36 U/L (ref 0–37)
Albumin: 3.6 g/dL (ref 3.5–5.2)
Anion gap: 10 (ref 5–15)
BILIRUBIN TOTAL: 0.8 mg/dL (ref 0.3–1.2)
BUN: 10 mg/dL (ref 6–23)
CO2: 25 mmol/L (ref 19–32)
Calcium: 9.3 mg/dL (ref 8.4–10.5)
Chloride: 105 mmol/L (ref 96–112)
Creatinine, Ser: 1.2 mg/dL (ref 0.50–1.35)
GFR calc non Af Amer: 55 mL/min — ABNORMAL LOW (ref >90)
GFR, EST AFRICAN AMERICAN: 64 mL/min — AB (ref >90)
Glucose, Bld: 123 mg/dL — ABNORMAL HIGH (ref 70–99)
Potassium: 3.6 mmol/L (ref 3.5–5.1)
Sodium: 140 mmol/L (ref 135–145)
TOTAL PROTEIN: 6.6 g/dL (ref 6.0–8.3)

## 2014-12-07 LAB — TYPE AND SCREEN
ABO/RH(D): A POS
Antibody Screen: NEGATIVE

## 2014-12-07 LAB — CBC WITH DIFFERENTIAL/PLATELET
BASOS PCT: 1 % (ref 0–1)
Basophils Absolute: 0 10*3/uL (ref 0.0–0.1)
EOS PCT: 3 % (ref 0–5)
Eosinophils Absolute: 0.1 10*3/uL (ref 0.0–0.7)
HEMATOCRIT: 37.4 % — AB (ref 39.0–52.0)
Hemoglobin: 12 g/dL — ABNORMAL LOW (ref 13.0–17.0)
LYMPHS PCT: 39 % (ref 12–46)
Lymphs Abs: 1.5 10*3/uL (ref 0.7–4.0)
MCH: 30.4 pg (ref 26.0–34.0)
MCHC: 32.1 g/dL (ref 30.0–36.0)
MCV: 94.7 fL (ref 78.0–100.0)
Monocytes Absolute: 0.2 10*3/uL (ref 0.1–1.0)
Monocytes Relative: 5 % (ref 3–12)
NEUTROS PCT: 53 % (ref 43–77)
Neutro Abs: 2 10*3/uL (ref 1.7–7.7)
PLATELETS: 155 10*3/uL (ref 150–400)
RBC: 3.95 MIL/uL — AB (ref 4.22–5.81)
RDW: 15.1 % (ref 11.5–15.5)
WBC: 3.8 10*3/uL — AB (ref 4.0–10.5)

## 2014-12-07 LAB — I-STAT ARTERIAL BLOOD GAS, ED
BICARBONATE: 25.3 meq/L — AB (ref 20.0–24.0)
O2 SAT: 100 %
PCO2 ART: 42.6 mmHg (ref 35.0–45.0)
Patient temperature: 98.6
TCO2: 27 mmol/L (ref 0–100)
pH, Arterial: 7.381 (ref 7.350–7.450)
pO2, Arterial: 438 mmHg — ABNORMAL HIGH (ref 80.0–100.0)

## 2014-12-07 LAB — URINALYSIS, ROUTINE W REFLEX MICROSCOPIC
Bilirubin Urine: NEGATIVE
Glucose, UA: NEGATIVE mg/dL
Hgb urine dipstick: NEGATIVE
Ketones, ur: NEGATIVE mg/dL
Leukocytes, UA: NEGATIVE
Nitrite: NEGATIVE
Protein, ur: NEGATIVE mg/dL
Specific Gravity, Urine: 1.01 (ref 1.005–1.030)
Urobilinogen, UA: 0.2 mg/dL (ref 0.0–1.0)
pH: 7 (ref 5.0–8.0)

## 2014-12-07 LAB — BRAIN NATRIURETIC PEPTIDE: B Natriuretic Peptide: 90.9 pg/mL (ref 0.0–100.0)

## 2014-12-07 LAB — I-STAT CHEM 8, ED
BUN: 11 mg/dL (ref 6–23)
CALCIUM ION: 1.19 mmol/L (ref 1.13–1.30)
CHLORIDE: 105 mmol/L (ref 96–112)
CREATININE: 1.1 mg/dL (ref 0.50–1.35)
GLUCOSE: 122 mg/dL — AB (ref 70–99)
HEMATOCRIT: 40 % (ref 39.0–52.0)
HEMOGLOBIN: 13.6 g/dL (ref 13.0–17.0)
POTASSIUM: 3.6 mmol/L (ref 3.5–5.1)
SODIUM: 142 mmol/L (ref 135–145)
TCO2: 22 mmol/L (ref 0–100)

## 2014-12-07 LAB — PROTIME-INR
INR: 2.28 — AB (ref 0.00–1.49)
INR: 2.2 — ABNORMAL HIGH (ref 0.00–1.49)
Prothrombin Time: 25.3 seconds — ABNORMAL HIGH (ref 11.6–15.2)
Prothrombin Time: 24.7 seconds — ABNORMAL HIGH (ref 11.6–15.2)

## 2014-12-07 LAB — ABO/RH: ABO/RH(D): A POS

## 2014-12-07 LAB — TROPONIN I
Troponin I: <0.03 ng/mL (ref <0.031)
Troponin I: <0.03 ng/mL (ref <0.031)

## 2014-12-07 LAB — I-STAT TROPONIN, ED: TROPONIN I, POC: 0 ng/mL (ref 0.00–0.08)

## 2014-12-07 LAB — MRSA PCR SCREENING: MRSA by PCR: POSITIVE — AB

## 2014-12-07 MED ORDER — DOPAMINE-DEXTROSE 3.2-5 MG/ML-% IV SOLN
0.0000 ug/kg/min | INTRAVENOUS | Status: DC
  Administered 2014-12-07: 5 ug/kg/min via INTRAVENOUS

## 2014-12-07 MED ORDER — MUPIROCIN 2 % EX OINT
1.0000 "application " | TOPICAL_OINTMENT | Freq: Two times a day (BID) | CUTANEOUS | Status: AC
  Administered 2014-12-07 – 2014-12-12 (×10): 1 via NASAL
  Filled 2014-12-07 (×2): qty 22

## 2014-12-07 MED ORDER — EPINEPHRINE HCL 1 MG/ML IJ SOLN
0.5000 ug/min | INTRAVENOUS | Status: DC
  Filled 2014-12-07: qty 4

## 2014-12-07 MED ORDER — DOPAMINE-DEXTROSE 3.2-5 MG/ML-% IV SOLN
INTRAVENOUS | Status: AC
  Filled 2014-12-07: qty 250

## 2014-12-07 MED ORDER — ATORVASTATIN CALCIUM 40 MG PO TABS
40.0000 mg | ORAL_TABLET | Freq: Every day | ORAL | Status: DC
  Administered 2014-12-07 – 2014-12-17 (×11): 40 mg via ORAL
  Filled 2014-12-07 (×11): qty 1

## 2014-12-07 MED ORDER — ASPIRIN EC 81 MG PO TBEC
81.0000 mg | DELAYED_RELEASE_TABLET | Freq: Every day | ORAL | Status: DC
  Administered 2014-12-07 – 2014-12-17 (×11): 81 mg via ORAL
  Filled 2014-12-07 (×11): qty 1

## 2014-12-07 MED ORDER — SODIUM CHLORIDE 0.9 % IV BOLUS (SEPSIS)
250.0000 mL | Freq: Once | INTRAVENOUS | Status: AC
  Administered 2014-12-07: 250 mL via INTRAVENOUS

## 2014-12-07 NOTE — H&P (Signed)
Patient ID: [name] [name] MRN: [id number], DOB/AGE: 06-04-1931   Admit date: 12/07/2014  Primary Physician: [name], DO Primary Cardiologist: Ena Dawley H  Pt. Profile:  Syncope, bradycardia  Problem List  Past Medical History  Diagnosis Date  . PE (pulmonary embolism)     unprovoked PE completed 6 months of warfarin, warfarin d/ced 02/12/2010, repeat PE 07/10/2010 after a long car ride and now on lifelong coumadin.  [name] Hypertension   . Pituitary microadenoma     incidental finding CT 12/2009  . Prostate cancer   . Depression   . Hyperlipidemia   . Coronary artery disease, non-occlusive     with history of MI; Cath 2008 w multivessel nonobstructive CAD  . Dementia     Past Surgical History  Procedure Laterality Date  . Prostatectomy       Allergies  Allergies  Allergen Reactions  . Aricept [Donepezil Hcl] Other (See Comments)    Symptomatic Bradycardia.  . Other Other (See Comments)    Shrimp gives him gout    HPI  Patient is a 81 y.o. male with PMH of Alzheimer's, DM, HTN and DJD, recurrent pulmonary embolism on chronic anticoagulation with Coumadin, who presented after syncope. Per history taken from patient and his sons the patient experienced some chest pain yesterday, this morning he complained of chest pain, his family called EMS, when EMS arrived patient was found unresponsive on floor with initial blood pressure of 70/40 and pulse in 40s. EMS started transcutaneous pacing and epinephrine drip that was started and 2 mg per hour. The patient received 2.5 mg of midazolam IV in the EMS and is currently very difficult to interview.  We were called to evaluate for possible symptomatic bradycardia. I turned down to pacing, and patient's underlying heart rhythm is sinus bradycardia with rate 47 -58 beats per minute, patient has frequent PVCs followed by compensatory pauses and I have seen only one nonconducted P-wave. The patient states that he has ongoing  pressure-like chest pain. He is breathing on a nonrebreather states and is complaining of some shortness of breath however this is improved from before.[name] His blood pressure while in epinephrine is 112/84.  Patient sons state that the patient is very active and fairly independent.   Home Medications  Prior to Admission medications   Medication Sig Start Date End Date Taking? Authorizing Provider  amLODipine (NORVASC) 10 MG tablet TAKE 1 TABLET BY MOUTH DAILY 12/02/14   Francesca Oman, DO  atorvastatin (LIPITOR) 40 MG tablet TAKE 1 TABLET (40 MG TOTAL) BY MOUTH EVERY EVENING. 11/26/14   Francesca Oman, DO  b complex vitamins tablet Take 1 tablet by mouth daily.    Historical Provider, MD  Calcium Carbonate-Vitamin D (CALCIUM-VITAMIN D) 500-200 MG-UNIT per tablet Take 1 tablet by mouth daily.    Historical Provider, MD  carbidopa-levodopa (SINEMET IR) 25-100 MG per tablet Take 1 tablet by mouth 3 (three) times daily. 09/18/14   Garvin Fila, MD  fish oil-omega-3 fatty acids 1000 MG capsule Take 2 g by mouth daily.     Historical Provider, MD  lisinopril (PRINIVIL,ZESTRIL) 40 MG tablet Take 1 tablet (40 mg total) by mouth daily. 05/13/14   Francesca Oman, DO  Multiple Vitamins-Minerals (MULTIVITAMIN WITH MINERALS) tablet Take 1 tablet by mouth daily.    Historical Provider, MD  NAMENDA XR 28 MG CP24 Take 28 mg by mouth daily. 01/23/14   Philmore Pali, NP  nortriptyline (PAMELOR) 10 MG capsule Take 10 mg  by mouth as needed. 10/15/14   Historical Provider, MD  omeprazole (PRILOSEC) 20 MG capsule Take 1 capsule (20 mg total) by mouth daily. 08/19/14   Francesca Oman, DO  warfarin (COUMADIN) 5 MG tablet Take 1 tablet (5mg ) by mouth on Monday, Tuesday, Thursday, Friday and Saturday.  Take 1.5 tablets (7.5mg ) by mouth on Sunday and Wednesday. 12/02/14   Francesca Oman, DO    Family History  Family History  Problem Relation Age of Onset  . Hypertension Father     Passed away from cerebral hemorrhage at age of 57.   [name] Dementia Mother     Passed away  . Hypertension Child     4 adult children  . Cervical cancer Other   . Lung cancer Other   . Stroke Other     Social History  History   Social History  . Marital Status: Married    Spouse Name: N/A  . Number of Children: 4  . Years of Education: master's   Occupational History  .      retired   Social History Main Topics  . Smoking status: Never Smoker   . Smokeless tobacco: Never Used  . Alcohol Use: No  . Drug Use: No  . Sexual Activity: Yes   Other Topics Concern  . Not on file   Social History Narrative   Lives with daughter. Wife also has severe dementia.      Review of Systems General:  No chills, fever, night sweats or weight changes.  Cardiovascular:  No chest pain, dyspnea on exertion, edema, orthopnea, palpitations, paroxysmal nocturnal dyspnea. Dermatological: No rash, lesions/masses Respiratory: No cough, dyspnea Urologic: No hematuria, dysuria Abdominal:   No nausea, vomiting, diarrhea, bright red blood per rectum, melena, or hematemesis Neurologic:  No visual changes, wkns, changes in mental status. All other systems reviewed and are otherwise negative except as noted above.  Physical Exam  Blood pressure 103/64, pulse 52, weight 159 lb (72.122 kg), SpO2 100 %.  General: Pleasant, NAD Psych: Normal affect. Neuro: Alert and oriented X 3. Moves all extremities spontaneously. HEENT: Normal  Neck: Supple without bruits or JVD. Lungs:  Resp regular and unlabored, CTA. Heart: RRR no s3, s4, or murmurs. Abdomen: Soft, non-tender, non-distended, BS + x 4.  Extremities: No clubbing, cyanosis or edema. DP/PT/Radials 2+ and equal bilaterally.  Labs  No results for input(s): CKTOTAL, CKMB, TROPONINI in the last 72 hours. Lab Results  Component Value Date   WBC 3.4* 02/13/2014   HGB 13.6 12/07/2014   HCT 40.0 12/07/2014   MCV 94.4 02/13/2014   PLT 191 02/13/2014    Recent Labs Lab 12/07/14 1316  NA 142  K  3.6  CL 105  BUN 11  CREATININE 1.10  GLUCOSE 122*   Lab Results  Component Value Date   CHOL 207* 06/30/2012   HDL 56 06/30/2012   LDLCALC 125* 06/30/2012   TRIG 129 06/30/2012   Lab Results  Component Value Date   DDIMER * 09/07/2009    2.27        AT THE INHOUSE ESTABLISHED CUTOFF VALUE OF 0.48 ug/mL FEU, THIS ASSAY HAS BEEN DOCUMENTED IN THE LITERATURE TO HAVE A SENSITIVITY AND NEGATIVE PREDICTIVE VALUE OF AT LEAST 98 TO 99%.  THE TEST RESULT SHOULD BE CORRELATED WITH AN ASSESSMENT OF THE CLINICAL PROBABILITY OF DVT / VTE.   Invalid input(s): POCBNP   Radiology/Studies  No results found.  Echocardiogram - 07/09/2013 Left ventricle: The cavity size was  normal. Wall thickness was increased in a pattern of mild LVH. Systolic function was normal. The estimated ejection fraction was in the range of 55% to 60%. - Atrial septum: No defect or patent foramen ovale was identified.  Telemetry: Personally reviewed with sinus bradycardia to sinus rhythm with frequent PVCs and compensatory pauses. Patient is intermittently being paced area  ECG: Sinus bradycardia, right bundle branch block, ST -T-wave abnormalities suggestive of possible inferior and anterior ischemia.    ASSESSMENT AND PLAN  81 year old gentleman  1. Symptomatic bradycardia - leading into syncope, requiring support with pressors, we will switch epinephrine to dopamine starting at 2.5 mcg/kg/minute.  There is unknown cause, the fact that this was preceded by chest pain is highly suspicious of possible acute coronary syndrome, i-STAT troponin done in the ER was negative, electrolytes and kidney function were normal. We will check INR as patient is on chronic Coumadin therapy and won't start heparin yet.  Because of acute mental status changes the ER physician we'll order CT had for possible new stroke versus bleeding as the patient was in Coumadin, if CT doesn't show any acute changes, te patient  will be tried transferred CCU for further management.  2. Chest pain - sounds typical, followed by symptomatic bradycardia and syncope, we'll continue checking serial troponins and EKGs the first EKG done in the ER suggestive of possible ischemia in the inferior anterior leads. We won't be starting heparin yet until we get results from INR.  3. Recurrent pulmonary embolism - on chronic anticoagulation with Coumadin, will check INR and start heparin as needed. We'll hold Coumadin for possible cardiac catheterization.  4. Hypertension - patient is currently hypotensive and we will hold his home hypertensive meds.  5. Hyperlipidemia - we'll restart home atorvastatin 40 mg daily.  DVT PPX - on chronic Coumadin we will check INR   Signed, Dorothy Spark, MD, [facility] 12/07/2014, 1:29 PM

## 2014-12-07 NOTE — ED Notes (Signed)
O2 changed to [place] @ 3L/M, returned from CT

## 2014-12-07 NOTE — ED Notes (Addendum)
Pt knows that urine is needed. Pt has urinal at bed side  

## 2014-12-07 NOTE — ED Notes (Signed)
To ED via [facility] from home-- called to home with c/o chest pain, when EMS arrived, pt was unresponsive on floor-- initial BP 70/40, pulse of 50--- EMS began pacing pt, started EPI drip, 1mg /250cc NS-- at 2mg /hr--on arrival pt is sluggish to respond -- received Midazolam 2.5mg  per EMS.

## 2014-12-07 NOTE — ED Notes (Signed)
Cardiology at the bedside.

## 2014-12-07 NOTE — ED Notes (Signed)
Report given to Junie Panning, RN on [facility].

## 2014-12-07 NOTE — ED Provider Notes (Signed)
CSN: [id number]     Arrival date & time 12/07/14  1246 History   First MD Initiated Contact with Patient 12/07/14 1256     Chief Complaint  Patient presents with  . Chest Pain     (Consider location/radiation/quality/duration/timing/severity/associated sxs/prior Treatment) HPI The patient had a syncopal episode at home. EMS was contacted and upon arrival they found the patient to be poorly responsive with a heart rate less than 50 and poorly palpable pulses. The blood pressures were in the 37S systolic. The patient was maintaining his own airway. He was placed on a transcutaneous pacer and epinephrine drip was initiated. The patient had a positive response to this intervention and became more responsive. Medics report at that point he was speaking with them in a coherent fashion but became agitated by the pacing and was given 2-1/2 mg of Versed. On route he maintained a stable blood pressure and airway. On arrival to the emergency department the patient was resting quietly and responding very slowly to questions. He would attempt to response with a 1 or 2 word poorly intelligible answer. He was given a 250 mL fluid bolus and pacing was transitioned over with capture to our monitor. Epinephrine was continued as well and he was placed on a nonrebreather mask. He continued to have gradual in improvement with these interventions and ultimately proved to be a decent historian. At that point he advised that he had not felt well yesterday and had experienced some chest pain. He reports about 30 minutes prior to letting his family know he started feeling really quite bad and then the syncopal episode occurred. He denied he had any headache either before or after the episode. Past Medical History  Diagnosis Date  . PE (pulmonary embolism)     unprovoked PE completed 6 months of warfarin, warfarin d/ced 02/12/2010, repeat PE 07/10/2010 after a long car ride and now on lifelong coumadin.  [name] Hypertension   .  Pituitary microadenoma     incidental finding CT 12/2009  . Prostate cancer   . Depression   . Hyperlipidemia   . Coronary artery disease, non-occlusive     with history of MI; Cath 2008 w multivessel nonobstructive CAD  . Dementia    Past Surgical History  Procedure Laterality Date  . Prostatectomy     Family History  Problem Relation Age of Onset  . Hypertension Father     Passed away from cerebral hemorrhage at age of 74.  [name] Dementia Mother     Passed away  . Hypertension Child     4 adult children  . Cervical cancer Other   . Lung cancer Other   . Stroke Other    History  Substance Use Topics  . Smoking status: Never Smoker   . Smokeless tobacco: Never Used  . Alcohol Use: No    Review of Systems  10 Systems reviewed and are negative for acute change except as noted in the HPI. To family members observation the patient has been well for the past several days and had not been making any complaints or having any change in his baseline function.  Allergies  Aricept and Other  Home Medications   Prior to Admission medications   Medication Sig Start Date End Date Taking? Authorizing Provider  amLODipine (NORVASC) 10 MG tablet TAKE 1 TABLET BY MOUTH DAILY 12/02/14  Yes Francesca Oman, DO  atorvastatin (LIPITOR) 40 MG tablet TAKE 1 TABLET (40 MG TOTAL) BY MOUTH EVERY EVENING. 11/26/14  Yes Francesca Oman, DO  b complex vitamins tablet Take 1 tablet by mouth daily.   Yes Historical Provider, MD  Calcium Carbonate-Vitamin D (CALCIUM-VITAMIN D) 500-200 MG-UNIT per tablet Take 1 tablet by mouth daily.   Yes Historical Provider, MD  carbidopa-levodopa (SINEMET IR) 25-100 MG per tablet Take 1 tablet by mouth 3 (three) times daily. 09/18/14  Yes Garvin Fila, MD  fish oil-omega-3 fatty acids 1000 MG capsule Take 2 g by mouth daily.    Yes Historical Provider, MD  lisinopril (PRINIVIL,ZESTRIL) 40 MG tablet Take 1 tablet (40 mg total) by mouth daily. 05/13/14  Yes Francesca Oman, DO   Multiple Vitamins-Minerals (MULTIVITAMIN WITH MINERALS) tablet Take 1 tablet by mouth daily.   Yes Historical Provider, MD  NAMENDA XR 28 MG CP24 Take 28 mg by mouth daily. 01/23/14  Yes Philmore Pali, NP  nortriptyline (PAMELOR) 10 MG capsule Take 10 mg by mouth as needed. 10/15/14  Yes Historical Provider, MD  omeprazole (PRILOSEC) 20 MG capsule Take 1 capsule (20 mg total) by mouth daily. 08/19/14  Yes Francesca Oman, DO  warfarin (COUMADIN) 5 MG tablet Take 1 tablet (5mg ) by mouth on Monday, Tuesday, Thursday, Friday and Saturday.  Take 1.5 tablets (7.5mg ) by mouth on Sunday and Wednesday. 12/02/14  Yes Alex Ronnie Derby, DO   BP 114/64 mmHg  Pulse 63  Temp(Src) 95.8 F (35.4 C)  Resp 17  Wt 159 lb (72.122 kg)  SpO2 99% Physical Exam  Constitutional:  The patient arrives slightly pale and somnolent. His airway is protected without any sonorous breathing or secretion pooling. He is slow to respond.  HENT:  Head: Normocephalic and atraumatic.  Right Ear: External ear normal.  Left Ear: External ear normal.  Nose: Nose normal.  Mouth/Throat: Oropharynx is clear and moist.  Eyes: EOM are normal. Pupils are equal, round, and reactive to light. Right eye exhibits no discharge. Left eye exhibits no discharge.  Neck: Neck supple.  Cardiovascular:  Patient is bradycardic with distant heart sounds. He is auscultated with the translating a spacer in place. There is occasional ectopic beats.  Pulmonary/Chest: Effort normal and breath sounds normal. No respiratory distress. He has no wheezes.  Patient's initial auscultation his anterior auscultation. On arrival he is not in acute respiratory distress. His respirators are essentially nonlabored, he cooperates to take deep inspiration. Anterior auscultation there is no gross wheeze or rail and there is air flow symmetrically.  Abdominal: Soft. Bowel sounds are normal. He exhibits no distension. There is no tenderness. There is no rebound.  Musculoskeletal:  He exhibits no edema or tenderness.  Neurological:  Initially the patient was somnolent and slow to follow commands. After a period of treatment he improved significantly and would follow commands for grips and symmetric movement of the lower extremities. Became clear and oriented to situation.  Skin: Skin is warm and dry. There is pallor.  Psychiatric: He has a normal mood and affect.    ED Course  Procedures (including critical care time) Labs Review Labs Reviewed  COMPREHENSIVE METABOLIC PANEL - Abnormal; Notable for the following:    Glucose, Bld 123 (*)    GFR calc non Af Amer 55 (*)    GFR calc Af Amer 64 (*)    All other components within normal limits  CBC WITH DIFFERENTIAL/PLATELET - Abnormal; Notable for the following:    WBC 3.8 (*)    RBC 3.95 (*)    Hemoglobin 12.0 (*)    HCT  37.4 (*)    All other components within normal limits  PROTIME-INR - Abnormal; Notable for the following:    Prothrombin Time 25.3 (*)    INR 2.28 (*)    All other components within normal limits  I-STAT CHEM 8, ED - Abnormal; Notable for the following:    Glucose, Bld 122 (*)    All other components within normal limits  I-STAT ARTERIAL BLOOD GAS, ED - Abnormal; Notable for the following:    pO2, Arterial 438.0 (*)    Bicarbonate 25.3 (*)    All other components within normal limits  BRAIN NATRIURETIC PEPTIDE  URINALYSIS, ROUTINE W REFLEX MICROSCOPIC  TROPONIN I  TROPONIN I  TROPONIN I  PROTIME-INR  I-STAT TROPOININ, ED  TYPE AND SCREEN  ABO/RH    Imaging Review Ct Head Wo Contrast  12/07/2014   CLINICAL DATA:  Unresponsive, hypertension, bradycardia. Acute chest pain.  EXAM: CT HEAD WITHOUT CONTRAST  TECHNIQUE: Contiguous axial images were obtained from the base of the skull through the vertex without contrast.  COMPARISON:  03/14/2013  FINDINGS: Global brain atrophy evident with chronic white matter microvascular ischemic changes throughout the cerebral white matter. No acute  intracranial hemorrhage, mass lesion, definite infarction, midline shift, herniation, hydrocephalus, or extra-axial fluid collection. No focal mass effect or edema. Cisterns are patent. Cerebellar atrophy as well. Stable chronic pituitary mass compatible with a known macroadenoma, image 10. This measures 19 mm.  IMPRESSION: No acute intracranial finding by noncontrast CT.  Stable brain atrophy, chronic white matter microvascular ischemic changes, and pituitary macroadenoma.   Electronically Signed   By: Jerilynn Mages.  Shick M.D.   On: 12/07/2014 14:06   Dg Chest Port 1 View  12/07/2014   CLINICAL DATA:  Chest pain, unresponsiveness  EXAM: PORTABLE CHEST - 1 VIEW  COMPARISON:  01/31/2014  FINDINGS: Cardiac shadow is stable. The lungs are well aerated bilaterally. No focal infiltrate is seen. Monitoring devices are noted over the chest.  IMPRESSION: No acute abnormality noted.   Electronically Signed   By: Inez Catalina M.D.   On: 12/07/2014 13:37     EKG Interpretation None     Cardiology was consult is for bradycardia with transcutaneous pacing. Dr. Meda Coffee came to the emergency department and assessed the patient. She suggested transition from epinephrine to dopamine. She did request CT head as patient has been on Coumadin. At this point he has made improvement and he will be admitted for continued cardiology assessment regarding necessity for pacer or other interventional treatment.  CRITICAL CARE Performed by: Charlesetta Shanks   Total critical care time: 45  Critical care time was exclusive of separately billable procedures and treating other patients.  Critical care was necessary to treat or prevent imminent or life-threatening deterioration.  Critical care was time spent personally by me on the following activities: development of treatment plan with patient and/or surrogate as well as nursing, discussions with consultants, evaluation of patient's response to treatment, examination of patient, obtaining  history from patient or surrogate, ordering and performing treatments and interventions, ordering and review of laboratory studies, ordering and review of radiographic studies, pulse oximetry and re-evaluation of patient's condition. MDM   Final diagnoses:  Syncope and collapse  Bradycardia   The patient presents as outlined above. CT head was evaluated with no evidence of intracranial bleeding. Patient arrived in critical condition as stated. He did spot positively to transcutaneous pacing and pressor support. He will be admitted as outlined.    Charlesetta Shanks, MD 12/07/14  1524 

## 2014-12-07 NOTE — ED Notes (Signed)
Pacer turned off -- cardiology remains at bedside

## 2014-12-07 NOTE — ED Notes (Signed)
Cardiologist at bedside.  

## 2014-12-07 NOTE — ED Notes (Signed)
Family at bedside. 

## 2014-12-08 DIAGNOSIS — R55 Syncope and collapse: Secondary | ICD-10-CM | POA: Diagnosis present

## 2014-12-08 LAB — BASIC METABOLIC PANEL
Anion gap: 9 (ref 5–15)
BUN: 10 mg/dL (ref 6–23)
CHLORIDE: 103 mmol/L (ref 96–112)
CO2: 26 mmol/L (ref 19–32)
Calcium: 9.5 mg/dL (ref 8.4–10.5)
Creatinine, Ser: 0.98 mg/dL (ref 0.50–1.35)
GFR, EST AFRICAN AMERICAN: 87 mL/min — AB (ref >90)
GFR, EST NON AFRICAN AMERICAN: 75 mL/min — AB (ref >90)
GLUCOSE: 131 mg/dL — AB (ref 70–99)
Potassium: 3.7 mmol/L (ref 3.5–5.1)
SODIUM: 138 mmol/L (ref 135–145)

## 2014-12-08 LAB — CBC
HCT: 38.9 % — ABNORMAL LOW (ref 39.0–52.0)
HEMOGLOBIN: 12.7 g/dL — AB (ref 13.0–17.0)
MCH: 30.3 pg (ref 26.0–34.0)
MCHC: 32.6 g/dL (ref 30.0–36.0)
MCV: 92.8 fL (ref 78.0–100.0)
PLATELETS: 155 10*3/uL (ref 150–400)
RBC: 4.19 MIL/uL — AB (ref 4.22–5.81)
RDW: 14.7 % (ref 11.5–15.5)
WBC: 4.6 10*3/uL (ref 4.0–10.5)

## 2014-12-08 LAB — PROTIME-INR
INR: 2.11 — ABNORMAL HIGH (ref 0.00–1.49)
Prothrombin Time: 23.9 seconds — ABNORMAL HIGH (ref 11.6–15.2)

## 2014-12-08 LAB — TROPONIN I: Troponin I: <0.03 ng/mL (ref <0.031)

## 2014-12-08 MED ORDER — SODIUM CHLORIDE 0.9 % IV SOLN
INTRAVENOUS | Status: DC
  Administered 2014-12-08: 06:00:00 via INTRAVENOUS

## 2014-12-08 NOTE — Progress Notes (Signed)
Patient Name: [name] [name] Date of Encounter: 12/08/2014  Active Problems:   Symptomatic bradycardia   Syncope   Chest pain with high risk of acute coronary syndrome   Hypertension   Hyperlipidemia   Recurrent pulmonary embolism   Anticoagulated on Coumadin   Syncope and collapse   Length of Stay: 1  SUBJECTIVE  The patient denies any chest pain or SOB, feels much better today than yesterday.   CURRENT MEDS . aspirin EC  81 mg Oral Daily  . atorvastatin  40 mg Oral q1800  . mupirocin ointment  1 application Nasal BID   . sodium chloride 10 mL/hr at 12/08/14 0800  . DOPamine 3.033 mcg/kg/min (12/08/14 0800)   OBJECTIVE  Filed Vitals:   12/08/14 0500 12/08/14 0600 12/08/14 0700 12/08/14 0800  BP: 132/79 122/81 133/84 123/83  Pulse: 55 60 62 74  Temp:    97.7 F (36.5 C)  TempSrc:    Oral  Resp: 14 21 13 19   Weight:      SpO2: 97% 96% 96% 94%    Intake/Output Summary (Last 24 hours) at 12/08/14 0914 Last data filed at 12/08/14 0800  Gross per 24 hour  Intake 140.14 ml  Output    500 ml  Net -359.86 ml   Filed Weights   12/07/14 1302  Weight: 159 lb (72.122 kg)    PHYSICAL EXAM  General: Pleasant, NAD. Neuro: Alert and oriented X 1. Moves all extremities spontaneously. Psych: Normal affect. HEENT:  Normal  Neck: Supple without bruits or JVD. Lungs:  Resp regular and unlabored, CTA. Heart: RRR no s3, s4, or murmurs. Abdomen: Soft, non-tender, non-distended, BS + x 4.  Extremities: No clubbing, cyanosis or edema. DP/PT/Radials 2+ and equal bilaterally.  Accessory Clinical Findings  CBC  Recent Labs  12/07/14 1313 12/07/14 1316 12/08/14 0250  WBC 3.8*  --  4.6  NEUTROABS 2.0  --   --   HGB 12.0* 13.6 12.7*  HCT 37.4* 40.0 38.9*  MCV 94.7  --  92.8  PLT 155  --  563   Basic Metabolic Panel  Recent Labs  12/07/14 1313 12/07/14 1316 12/08/14 0250  NA 140 142 138  K 3.6 3.6 3.7  CL 105 105 103  CO2 25  --  26  GLUCOSE 123* 122*  131*  BUN 10 11 10   CREATININE 1.20 1.10 0.98  CALCIUM 9.3  --  9.5   Liver Function Tests  Recent Labs  12/07/14 1313  AST 36  ALT 7  ALKPHOS 42  BILITOT 0.8  PROT 6.6  ALBUMIN 3.6   Cardiac Enzymes  Recent Labs  12/07/14 1457 12/07/14 1937 12/08/14 0250  TROPONINI <0.03 <0.03 <0.03   Radiology/Studies  Ct Head Wo Contrast  12/07/2014   CLINICAL DATA:  Unresponsive, hypertension, bradycardia. Acute chest pain.  IMPRESSION: No acute intracranial finding by noncontrast CT.  Stable brain atrophy, chronic white matter microvascular ischemic changes, and pituitary macroadenoma.     Dg Chest Port 1 View  12/07/2014   CLINICAL DATA:  Chest pain, unresponsiveness  EXAM: PORTABLE CHEST - 1 VIEW  COMPARISON:  01/31/2014  FINDINGS: Cardiac shadow is stable. The lungs are well aerated bilaterally. No focal infiltrate is seen. Monitoring devices are noted over the chest.  IMPRESSION: No acute abnormality noted.     TELE: SR, frequent PVCs with compensatory pauses - up to 2 seconds long  ECG: SR, RBBB, no ischemic changes   ASSESSMENT AND PLAN  81 year old gentleman  1.  Symptomatic bradycardia - in 79-50', with frequent PVCs followed by up to 2 seconds pauses, leading into syncope, requiring support with pressors, epinephrine switched to dopamine currently at 3 mcg/kg/minute.  There is unknown cause, the fact that this was preceded by chest pain is highly suspicious of possible acute coronary syndrome, troponin negative x 3, ECG is unchanged from prior and or suggestive of any ischemic changes, electrolytes and kidney function were normal. We will check INR as patient is on chronic Coumadin therapy and won't start heparin yet.  Because of acute mental status changes ST head was done and showed no acute abnormalities.  We will try to wean Dopamin today.   The patient had a similar episode in November 2014, at the end the decision was not to place a pacemaker. We will decide by  tomorrow if we are able to wean him off dopamin. He has underlying dementia but based on his daughter he is fairly active.   2. Chest pain - sounds typical, followed by symptomatic bradycardia and syncope, we'll continue checking serial troponins and EKGs the first EKG done in the ER suggestive of possible ischemia in the inferior anterior leads. We won't be starting heparin yet until we get results from INR, today 2.1.  3. Recurrent pulmonary embolism - on chronic anticoagulation with Coumadin, will check INR and start heparin as needed. We'll hold Coumadin for possible cardiac catheterization.  4. Hypertension - controlled and we will hold his home hypertensive meds.  5. Hyperlipidemia - we'll restart home atorvastatin 40 mg daily.  DVT PPX - on chronic Coumadin, INR today 2.1   Signed, Dorothy Spark MD, [facility] 12/08/2014

## 2014-12-08 NOTE — Progress Notes (Signed)
UR Completed.  [phone]  

## 2014-12-09 DIAGNOSIS — R001 Bradycardia, unspecified: Secondary | ICD-10-CM

## 2014-12-09 LAB — PROTIME-INR
INR: 2.13 — ABNORMAL HIGH (ref 0.00–1.49)
INR: 1.85 — ABNORMAL HIGH (ref 0.00–1.49)
PROTHROMBIN TIME: 21.5 s — AB (ref 11.6–15.2)
Prothrombin Time: 24 seconds — ABNORMAL HIGH (ref 11.6–15.2)

## 2014-12-09 LAB — HEPARIN LEVEL (UNFRACTIONATED): Heparin Unfractionated: 0.35 IU/mL (ref 0.30–0.70)

## 2014-12-09 MED ORDER — CHLORHEXIDINE GLUCONATE CLOTH 2 % EX PADS
6.0000 | MEDICATED_PAD | Freq: Every day | CUTANEOUS | Status: AC
  Administered 2014-12-09 – 2014-12-13 (×5): 6 via TOPICAL

## 2014-12-09 MED ORDER — VITAMIN K1 10 MG/ML IJ SOLN
2.5000 mg | Freq: Once | INTRAVENOUS | Status: AC
  Administered 2014-12-09: 2.5 mg via INTRAVENOUS
  Filled 2014-12-09: qty 0.25

## 2014-12-09 MED ORDER — VITAMIN K1 10 MG/ML IJ SOLN
2.5000 mg | Freq: Once | INTRAMUSCULAR | Status: AC
  Administered 2014-12-09: 2.5 mg via INTRAVENOUS
  Filled 2014-12-09: qty 0.25

## 2014-12-09 MED ORDER — HEPARIN (PORCINE) IN NACL 100-0.45 UNIT/ML-% IJ SOLN
850.0000 [IU]/h | INTRAMUSCULAR | Status: DC
  Administered 2014-12-09: 850 [IU]/h via INTRAVENOUS
  Filled 2014-12-09 (×2): qty 250

## 2014-12-09 NOTE — Progress Notes (Addendum)
ANTICOAGULATION CONSULT NOTE - Initial Consult  Pharmacy Consult for heparin Indication: recurrent PE  Allergies  Allergen Reactions  . Aricept [Donepezil Hcl] Other (See Comments)    Symptomatic Bradycardia.  . Other Other (See Comments)    Shrimp gives him gout    Patient Measurements: Height: 5\' 10"  (177.8 cm) Weight: 159 lb (72.122 kg) IBW/kg (Calculated) : 73 Heparin Dosing Weight: 70kg  Vital Signs: Temp: 97.6 F (36.4 C) (03/21 0700) Temp Source: Oral (03/21 0700) BP: 128/81 mmHg (03/21 0800) Pulse Rate: 56 (03/21 0800)  Labs:  Recent Labs  12/07/14 1313 12/07/14 1316 12/07/14 1457 12/07/14 1937 12/08/14 0250 12/09/14 0316  HGB 12.0* 13.6  --   --  12.7*  --   HCT 37.4* 40.0  --   --  38.9*  --   PLT 155  --   --   --  155  --   LABPROT 25.3*  --  24.7*  --  23.9* 24.0*  INR 2.28*  --  2.20*  --  2.11* 2.13*  CREATININE 1.20 1.10  --   --  0.98  --   TROPONINI  --   --  <0.03 <0.03 <0.03  --     Estimated Creatinine Clearance: 60.3 mL/min (by C-G formula based on Cr of 0.98).   Medical History: Past Medical History  Diagnosis Date  . PE (pulmonary embolism)     unprovoked PE completed 6 months of warfarin, warfarin d/ced 02/12/2010, repeat PE 07/10/2010 after a long car ride and now on lifelong coumadin.  [name] Hypertension   . Pituitary microadenoma     incidental finding CT 12/2009  . Prostate cancer   . Depression   . Hyperlipidemia   . Coronary artery disease, non-occlusive     with history of MI; Cath 2008 w multivessel nonobstructive CAD  . Dementia    Assessment: 81 year old male presents to [facility] with chest pain and symptomatic bradycardia leading to syncope. Patient has history of recurrent PE on chronic coumadin. INR is therapeutic at 2.1 this am (2.2 on admit). CT head done on 3/19 showed no bleeding. Plan is to continue to hold coumadin, give 2.5mg  IV Vit k and start IV heparin once INR drifts below 2. For possible cath/pacer once INR  drops.  Goal of Therapy:  Heparin level goal 0.3-0.7 INR 2-3 Monitor platelets by anticoagulation protocol: Yes   Plan:  Start heparin at 850 units/hr once INR <2 INR check at 1300 this afternoon Daily CBC/HL  Follow up restart of warfarin once procedures are completed  Erin Hearing PharmD., BCPS Clinical Pharmacist Pager [phone] 12/09/2014 9:48 AM

## 2014-12-09 NOTE — Progress Notes (Signed)
Patient Name: [name] [name] Date of Encounter: 12/09/2014  Active Problems:   Symptomatic bradycardia   Syncope   Chest pain with high risk of acute coronary syndrome   Hypertension   Hyperlipidemia   Recurrent pulmonary embolism   Anticoagulated on Coumadin   Syncope and collapse   Length of Stay: 2  SUBJECTIVE  No events overnight. Chest pain has resolved. Troponin negative x 3. Weaned off dopamine. HR remains in the 50's.  INR stable, despite holding warfarin.  CURRENT MEDS . aspirin EC  81 mg Oral Daily  . atorvastatin  40 mg Oral q1800  . Chlorhexidine Gluconate Cloth  6 each Topical Q0600  . mupirocin ointment  1 application Nasal BID   . sodium chloride Stopped (12/09/14 0800)  . DOPamine Stopped (12/08/14 1100)   OBJECTIVE  Filed Vitals:   12/09/14 0500 12/09/14 0600 12/09/14 0700 12/09/14 0800  BP: 106/71 126/80 110/86 128/81  Pulse: 56 61 52 56  Temp:   97.6 F (36.4 C)   TempSrc:   Oral   Resp: 14 13 15 11   Height:      Weight:      SpO2: 100% 100% 100% 100%    Intake/Output Summary (Last 24 hours) at 12/09/14 0821 Last data filed at 12/09/14 0800  Gross per 24 hour  Intake  242.7 ml  Output    825 ml  Net -582.3 ml   Filed Weights   12/07/14 1302  Weight: 159 lb (72.122 kg)    PHYSICAL EXAM  General: Pleasant, NAD. Neuro: Alert and oriented X 1. Moves all extremities spontaneously. Psych: Normal affect. HEENT:  Normal  Neck: Supple without bruits or JVD. Lungs:  Resp regular and unlabored, CTA. Heart: RRR no s3, s4, or murmurs. Abdomen: Soft, non-tender, non-distended, BS + x 4.  Extremities: No clubbing, cyanosis or edema. DP/PT/Radials 2+ and equal bilaterally.  Accessory Clinical Findings  CBC  Recent Labs  12/07/14 1313 12/07/14 1316 12/08/14 0250  WBC 3.8*  --  4.6  NEUTROABS 2.0  --   --   HGB 12.0* 13.6 12.7*  HCT 37.4* 40.0 38.9*  MCV 94.7  --  92.8  PLT 155  --  132   Basic Metabolic Panel  Recent Labs  12/07/14 1313 12/07/14 1316 12/08/14 0250  NA 140 142 138  K 3.6 3.6 3.7  CL 105 105 103  CO2 25  --  26  GLUCOSE 123* 122* 131*  BUN 10 11 10   CREATININE 1.20 1.10 0.98  CALCIUM 9.3  --  9.5   Liver Function Tests  Recent Labs  12/07/14 1313  AST 36  ALT 7  ALKPHOS 42  BILITOT 0.8  PROT 6.6  ALBUMIN 3.6   Cardiac Enzymes  Recent Labs  12/07/14 1457 12/07/14 1937 12/08/14 0250  TROPONINI <0.03 <0.03 <0.03   Radiology/Studies  Ct Head Wo Contrast  12/07/2014   CLINICAL DATA:  Unresponsive, hypertension, bradycardia. Acute chest pain.  IMPRESSION: No acute intracranial finding by noncontrast CT.  Stable brain atrophy, chronic white matter microvascular ischemic changes, and pituitary macroadenoma.     Dg Chest Port 1 View  12/07/2014   CLINICAL DATA:  Chest pain, unresponsiveness  EXAM: PORTABLE CHEST - 1 VIEW  COMPARISON:  01/31/2014  FINDINGS: Cardiac shadow is stable. The lungs are well aerated bilaterally. No focal infiltrate is seen. Monitoring devices are noted over the chest.  IMPRESSION: No acute abnormality noted.     TELE: SR, frequent PVCs with compensatory pauses -  up to 2 seconds long  ECG: SR, RBBB, no ischemic changes   ASSESSMENT AND PLAN  81 year old gentleman  1. Symptomatic bradycardia - in 40-50', with frequent PVCs followed by up to 2 seconds pauses, leading into syncope, requiring support with pressors, epinephrine switched to dopamine currently at 3 mcg/kg/minute.  He has ruled-out for NSTEMI, but suspicious for CAD. Had multivessel, non-obstructive CAD by cath in 2008.  Will need to exclude ischemia as a cause before going ahead with pacer. He does have some hesitation about pacer, however, daughter is in favor of it. I discussed the procedure and risk/benefits at length today (15 minutes) and he is considering it.   2. Chest pain - sounds typical, followed by symptomatic bradycardia and syncope.  Troponin is negative, however, INR has not  budged after holding warfarin. Will give Vitamin K today. Goal INR <1.5 to support left heart cath .[name] Possibly tomorrow.  Diet today. NPO p MN.  3. Recurrent pulmonary embolism - on chronic anticoagulation with Coumadin, will check INR and start heparin as needed. We'll hold Coumadin for possible cardiac catheterization. Will need heparin bridging when INR drops below 2.  4. Hypertension - controlled and we will hold his home hypertensive meds.  5. Hyperlipidemia - on atorvastatin 40 mg daily.  DVT PPX - on chronic Coumadin, INR today 2.13  Pixie Casino, MD, [facility] Attending Cardiologist [facility]  Shynice Sigel C  12/09/2014

## 2014-12-09 NOTE — Progress Notes (Signed)
[facility] for heparin Indication: h/o PE  Allergies  Allergen Reactions  . Aricept [Donepezil Hcl] Other (See Comments)    Symptomatic Bradycardia.  . Other Other (See Comments)    Shrimp gives him gout    Patient Measurements: Height: 5\' 10"  (177.8 cm) Weight: 159 lb (72.122 kg) IBW/kg (Calculated) : 73 Heparin Dosing Weight: 70kg  Vital Signs: Temp: 98.6 F (37 C) (03/21 2300) Temp Source: Oral (03/21 2300) BP: 151/90 mmHg (03/21 2320) Pulse Rate: 66 (03/21 2320)  Labs:  Recent Labs  12/07/14 1313 12/07/14 1316 12/07/14 1457 12/07/14 1937 12/08/14 0250 12/09/14 0316 12/09/14 1258 12/09/14 2310  HGB 12.0* 13.6  --   --  12.7*  --   --   --   HCT 37.4* 40.0  --   --  38.9*  --   --   --   PLT 155  --   --   --  155  --   --   --   LABPROT 25.3*  --  24.7*  --  23.9* 24.0* 21.5*  --   INR 2.28*  --  2.20*  --  2.11* 2.13* 1.85*  --   HEPARINUNFRC  --   --   --   --   --   --   --  0.35  CREATININE 1.20 1.10  --   --  0.98  --   --   --   TROPONINI  --   --  <0.03 <0.03 <0.03  --   --   --     Estimated Creatinine Clearance: 60.3 mL/min (by C-G formula based on Cr of 0.98).  Assessment: 81 year old male with chest pain/bradycardia, history of recurrent PE and Coumadin on hold, for heparin  Goal of Therapy:  Heparin level goal 0.3-0.7 INR 2-3 Monitor platelets by anticoagulation protocol: Yes   Plan:  Continue Heparin at current rate for now Follow-up am labs.  Phillis Knack, PharmD, BCPS   12/09/2014 11:59 PM

## 2014-12-09 NOTE — Progress Notes (Signed)
  Echocardiogram 2D Echocardiogram has been performed.  Melvenia Favela 12/09/2014, 1:35 PM

## 2014-12-09 NOTE — Progress Notes (Signed)
INR remains elevated at 1.85 - give additional 2.5 mg IV vitamin K. Should support LHC tomorrow.  Pixie Casino, MD, [facility] Attending Cardiologist [employer]

## 2014-12-10 ENCOUNTER — Encounter (HOSPITAL_COMMUNITY): Payer: Self-pay | Admitting: Cardiovascular Disease

## 2014-12-10 ENCOUNTER — Encounter (HOSPITAL_COMMUNITY)
Admission: EM | Disposition: A | Discharge status: Home Health | Payer: Self-pay | Source: Home / Self Care | Attending: Cardiology

## 2014-12-10 DIAGNOSIS — I2510 Atherosclerotic heart disease of native coronary artery without angina pectoris: Secondary | ICD-10-CM

## 2014-12-10 HISTORY — PX: LEFT HEART CATHETERIZATION WITH CORONARY ANGIOGRAM: SHX5451

## 2014-12-10 LAB — HEPARIN LEVEL (UNFRACTIONATED): Heparin Unfractionated: 0.49 IU/mL (ref 0.30–0.70)

## 2014-12-10 LAB — POCT ACTIVATED CLOTTING TIME: Activated Clotting Time: 104 seconds

## 2014-12-10 LAB — CBC
HCT: 36.7 % — ABNORMAL LOW (ref 39.0–52.0)
Hemoglobin: 11.9 g/dL — ABNORMAL LOW (ref 13.0–17.0)
MCH: 30.1 pg (ref 26.0–34.0)
MCHC: 32.4 g/dL (ref 30.0–36.0)
MCV: 92.9 fL (ref 78.0–100.0)
Platelets: 163 K/uL (ref 150–400)
RBC: 3.95 MIL/uL — ABNORMAL LOW (ref 4.22–5.81)
RDW: 15 % (ref 11.5–15.5)
WBC: 3.9 K/uL — ABNORMAL LOW (ref 4.0–10.5)

## 2014-12-10 LAB — PROTIME-INR
INR: 1.72 — ABNORMAL HIGH (ref 0.00–1.49)
PROTHROMBIN TIME: 20.3 s — AB (ref 11.6–15.2)

## 2014-12-10 SURGERY — LEFT HEART CATHETERIZATION WITH CORONARY ANGIOGRAM

## 2014-12-10 MED ORDER — SODIUM CHLORIDE 0.9 % IV SOLN
INTRAVENOUS | Status: DC
  Administered 2014-12-10: 08:00:00 via INTRAVENOUS

## 2014-12-10 MED ORDER — SODIUM CHLORIDE 0.9 % IV SOLN
INTRAVENOUS | Status: DC
  Administered 2014-12-10: 18:00:00 via INTRAVENOUS

## 2014-12-10 MED ORDER — HEPARIN (PORCINE) IN NACL 2-0.9 UNIT/ML-% IJ SOLN
INTRAMUSCULAR | Status: AC
  Filled 2014-12-10: qty 1000

## 2014-12-10 MED ORDER — FENTANYL CITRATE 0.05 MG/ML IJ SOLN
INTRAMUSCULAR | Status: AC
  Filled 2014-12-10: qty 2

## 2014-12-10 MED ORDER — ISOSORBIDE MONONITRATE ER 30 MG PO TB24
30.0000 mg | ORAL_TABLET | Freq: Every day | ORAL | Status: DC
  Administered 2014-12-10 – 2014-12-15 (×6): 30 mg via ORAL
  Filled 2014-12-10 (×6): qty 1

## 2014-12-10 MED ORDER — SODIUM CHLORIDE 0.9 % IV SOLN: 250.0000 mL | INTRAVENOUS | Status: DC | PRN

## 2014-12-10 MED ORDER — MIDAZOLAM HCL 2 MG/2ML IJ SOLN
INTRAMUSCULAR | Status: AC
  Filled 2014-12-10: qty 2

## 2014-12-10 MED ORDER — LORAZEPAM 2 MG/ML IJ SOLN
0.2500 mg | Freq: Once | INTRAMUSCULAR | Status: AC
  Administered 2014-12-10: 0.25 mg via INTRAVENOUS
  Filled 2014-12-10: qty 1

## 2014-12-10 MED ORDER — TRAZODONE HCL 50 MG PO TABS
50.0000 mg | ORAL_TABLET | Freq: Every evening | ORAL | Status: DC | PRN
  Administered 2014-12-10 – 2014-12-17 (×6): 50 mg via ORAL
  Filled 2014-12-10 (×8): qty 1

## 2014-12-10 MED ORDER — NITROGLYCERIN 1 MG/10 ML FOR IR/CATH LAB
INTRA_ARTERIAL | Status: AC
  Filled 2014-12-10: qty 10

## 2014-12-10 MED ORDER — SODIUM CHLORIDE 0.9 % IJ SOLN: 3.0000 mL | INTRAMUSCULAR | Status: DC | PRN

## 2014-12-10 MED ORDER — ASPIRIN 81 MG PO CHEW: 81.0000 mg | CHEWABLE_TABLET | ORAL | Status: AC

## 2014-12-10 MED ORDER — LIDOCAINE HCL (PF) 1 % IJ SOLN
INTRAMUSCULAR | Status: AC
  Filled 2014-12-10: qty 30

## 2014-12-10 MED ORDER — SODIUM CHLORIDE 0.9 % IJ SOLN
3.0000 mL | Freq: Two times a day (BID) | INTRAMUSCULAR | Status: DC
  Administered 2014-12-10 (×2): 3 mL via INTRAVENOUS

## 2014-12-10 NOTE — CV Procedure (Addendum)
[name] [name] is a 81 y.o. male    [id number]  [id number] LOCATION:  FACILITY: [facility]  PHYSICIAN: [name], MD,  [facility] October 25, 1930   DATE OF PROCEDURE:  12/10/2014    CARDIAC CATHETERIZATION     HISTORY:    [name] [name] is a 81 y.o. male was history of recurrent pulmonary emboli for which he is on chronic Coumadin anticoagulation therapy.  He has a history of hypertension, prostate CA, hyperlipidemia, dementia and previous nonocclusive CAD noted at catheterization in 2008.  He was admitted several days ago after developing recurrent episodes of chest pain and was found unresponsive on the floor.  In the hospital, he was felt to have symptomatic bradycardia.  He required transient inotropic blood pressure support.  His Coumadin has been held and is referred for cardiac catheterization.   PROCEDURE:  Left heart catheterization: coronary angiography, selective administration of 200 micro-grams intracoronary nitroglycerin into the left coronary system, left ventriculography   The patient was brought to the [facility] cardiac catherization laboratory in the fasting state. He  was premedicated with Versed 1 mg and fentanyl 25 g. His right groin was prepped and shaved in usual sterile fashion. Xylocaine 1% was used for local anesthesia. A 5 French sheath was inserted into the R femoral artery. Diagnostic catheterizatiion was done with 5 [place] FL4, FR4, and pigtail catheters.  200 g of intracoronary nitroglycerin was selectively administered into the left coronary system.  Left ventriculography was done with 25 cc Omnipaque contrast. Hemostasis was obtained by direct manual compression. The patient tolerated the procedure well.   HEMODYNAMICS:   Central Aorta: 124/75   Left Ventricle: 124/8  ANGIOGRAPHY:  Left main: Large caliber normal long vessel which bifurcated into an LAD and left circumflex vessel.   LAD: Large caliber vessel that gave rise to a proximal diagonal vessel.   There was 40-50% stenosis in the diagonal vessel after a small proximal branch.  The LAD after the diagonal vessel at 50% stenosis prior to the septal perforating artery.  After the second small diagonal vessel in the mid LAD there was a small area that had significant mid systolic bridging with narrowing up to 80% during systole.  Following IC nitroglycerin this significantly improved.  During diastole, the vessel was 20% narrowed.  Left circumflex: Small tortuous vessel which gave rise to 2 OM branches.   Right coronary artery: Large dominant normal vessel that supplied the PDA and PLA vessel.   Left ventriculography revealed low-normal ejection fraction at 50-55%.  There was ectopy without definitive wall motion abnormalities.   IMPRESSION:  Normal LV function with an ejection fraction of 50 - 55% with ectopy during ventriculography.  Single vessel coronary artery disease involving the LAD with 40-50% stenosis in the first diagonal branch, 50% focal stenosis in the LAD after the takeoff of this diagonal vessel, and mid LAD, mid systolic bridging with narrowing up to 80% during systole, which improved to 20% during diastole and significantly improved following IC nitroglycerin administration.  RECOMMENDATION:  Medical therapy.  The patient will be started on nitrate therapy with his significant mid systolic bridging.  Consider amlodipine.  Coumadin will be restarted tomorrow in light of his long-term anticoagulation secondary to recurrent PE history.    [name], MD, [facility] 12/10/2014 5:05 PM

## 2014-12-10 NOTE — Progress Notes (Signed)
[name] [name] for heparin Indication: recurrent PE  Allergies  Allergen Reactions  . Aricept [Donepezil Hcl] Other (See Comments)    Symptomatic Bradycardia.  . Other Other (See Comments)    Shrimp gives him gout    Patient Measurements: Height: 5\' 10"  (177.8 cm) Weight: 155 lb 10.3 oz (70.6 kg) IBW/kg (Calculated) : 73 Heparin Dosing Weight: 70kg  Vital Signs: Temp: 97.2 F (36.2 C) (03/22 0758) Temp Source: Oral (03/22 0758) BP: 131/77 mmHg (03/22 0800) Pulse Rate: 58 (03/22 0800)  Labs:  Recent Labs  12/07/14 1313 12/07/14 1316 12/07/14 1457 12/07/14 1937 12/08/14 0250 12/09/14 0316 12/09/14 1258 12/09/14 2310 12/10/14 0225  HGB 12.0* 13.6  --   --  12.7*  --   --   --  11.9*  HCT 37.4* 40.0  --   --  38.9*  --   --   --  36.7*  PLT 155  --   --   --  155  --   --   --  163  LABPROT 25.3*  --  24.7*  --  23.9* 24.0* 21.5*  --  20.3*  INR 2.28*  --  2.20*  --  2.11* 2.13* 1.85*  --  1.72*  HEPARINUNFRC  --   --   --   --   --   --   --  0.35 0.49  CREATININE 1.20 1.10  --   --  0.98  --   --   --   --   TROPONINI  --   --  <0.03 <0.03 <0.03  --   --   --   --     Estimated Creatinine Clearance: 59 mL/min (by C-G formula based on Cr of 0.98).   Medical History: Past Medical History  Diagnosis Date  . PE (pulmonary embolism)     unprovoked PE completed 6 months of warfarin, warfarin d/ced 02/12/2010, repeat PE 07/10/2010 after a long car ride and now on lifelong coumadin.  [name] Hypertension   . Pituitary microadenoma     incidental finding CT 12/2009  . Prostate cancer   . Depression   . Hyperlipidemia   . Coronary artery disease, non-occlusive     with history of MI; Cath 2008 w multivessel nonobstructive CAD  . Dementia    Assessment: 81 year old male presents to [facility] with chest pain and symptomatic bradycardia leading to syncope. Patient has history of recurrent PE on chronic coumadin. INR was therapeutic 2.2 on admit.  CT head done on 3/19 showed no bleeding. Plan is to continue to hold coumadin, 5mg  IV Vit k total give 3/21 and started on IV heparin.  Heparin at goal this morning on 850 units/hr. INR down to 1.7, cbc stable. No bleeding noted.  Plan for cath this morning  Goal of Therapy:  Heparin level goal 0.3-0.7 INR 2-3 Monitor platelets by anticoagulation protocol: Yes   Plan:  Continue heparin at 850 units/hr  Daily CBC/HL  Follow up restart of warfarin once procedures are completed  Erin Hearing PharmD., BCPS Clinical Pharmacist Pager [phone] 12/10/2014 8:44 AM

## 2014-12-10 NOTE — Care Management Note (Addendum)
Page 1 of 2   12/17/2014     4:04:17 PM CARE MANAGEMENT NOTE 12/17/2014  Patient:  [name] [name]   Account Number:  [id number]  Date Initiated:  12/09/2014  Documentation initiated by:  [name] [name]  Subjective/Objective Assessment:   adm w brady     Action/Plan:   lives w da, pcp dr [name] [name], has some dementia   Anticipated DC Date:  12/17/2014   Anticipated DC Plan:  [place]  CM consult      [facility] Choice  HOME HEALTH   Choice offered to / List presented to:  C-4 Adult Children   DME arranged  3-N-1  [place]      DME agency  [place] arranged  HH-1 RN  HH-10 DISEASE MANAGEMENT  HH-2 PT  HH-3 OT  [place]      [place].   Status of service:  Completed, signed off Medicare Important Message given?  YES (If response is "NO", the following Medicare IM given date fields will be blank) Date Medicare IM given:  12/10/2014 Medicare IM given by:  [name] [name] Date Additional Medicare IM given:  12/16/2014 Additional Medicare IM given by:  [name] [name]  Discharge Disposition:  [place]  Per UR Regulation:  Reviewed for med. necessity/level of care/duration of stay  If discussed at [place] of Stay Meetings, dates discussed:   12/12/2014  12/16/2014    Comments:  12/17/14 [place] Notified [place] liaison that d/c is planned for today.  [employer] is talking with dtr - unsure about need for walker as another family member has a walker he can use.  They will deliver 3-N-1 to pt's home.  12/16/14 [place] MSN BSN CCM Received call to contact [employer] as they were unable to fill equipment order.  Talked with [name] [name] who states that the NP who signed orders is not registered in [place].  Printed orders - staff RN will ask MD to sign orders, CM will fax  orders to [facility]. [id number] Orders signed by MD, faxed to [facility].  Plan is loop recorder insertion @ 4:00 p.m. today, possible d/c tomorrow if medically stable.  Family present, state they will provide 24/7 assistance to pt as long as needed.  12-13-14 [place], RN,BSN [phone] CM did receive call from [facility] and they will not have availability until next Wed. CM did go with [facility] and SOC to begin within 24-48 hours post d/c. DME via [employer].  12-13-14 [address], [place] [phone] CM did speak to daughter [name] [name] and she would like pt to be d/c home once medically stable. Daughter chose [name] with [place] being second choice if [name]  is not able to provide services. DME to go through [facility]- they will contact daughter for delivery times. CM will place order for DME services. CM did fax in face sheet to see if [name] will accept pt for serivces. Per NP if pt is therapeutic he may d/c home via weekend. No further needs at this time.    12-12-14 [address] [place] [phone] CM did contact daughter [name] [name] in ref to home needs. Pt will need PT evaluation for home needs. CM will discuss disposition needs  in am with daughter.

## 2014-12-10 NOTE — Interval H&P Note (Signed)
Cath Lab Visit (complete for each Cath Lab visit)  Clinical Evaluation Leading to the Procedure:   ACS: No.  Non-ACS:    Anginal Classification: CCS IV  Anti-ischemic medical therapy: Minimal Therapy (1 class of medications)  Non-Invasive Test Results: No non-invasive testing performed  Prior CABG: No previous CABG      History and Physical Interval Note:  12/10/2014 4:15 PM  [name] [name]  has presented today for surgery, with the diagnosis of cp  The various methods of treatment have been discussed with the patient and family. After consideration of risks, benefits and other options for treatment, the patient has consented to  Procedure(s): LEFT HEART CATHETERIZATION WITH CORONARY ANGIOGRAM (N/A) as a surgical intervention .  The patient's history has been reviewed, patient examined, no change in status, stable for surgery.  I have reviewed the patient's chart and labs.  Questions were answered to the patient's satisfaction.     Birl Lobello A

## 2014-12-10 NOTE — Progress Notes (Signed)
Pt given Ativan 0.25mg  IV for agitation.  Tolerated well.

## 2014-12-10 NOTE — H&P (View-Only) (Signed)
Patient Name: [name] [name] Date of Encounter: 12/10/2014  Active Problems:   Symptomatic bradycardia   Syncope   Chest pain with high risk of acute coronary syndrome   Hypertension   Hyperlipidemia   Recurrent pulmonary embolism   Anticoagulated on Coumadin   Syncope and collapse   Length of Stay: 3  SUBJECTIVE  No events overnight. Denies any chest pain. HR remains in the 60's. Plan for LHC today.  CURRENT MEDS . aspirin EC  81 mg Oral Daily  . atorvastatin  40 mg Oral q1800  . Chlorhexidine Gluconate Cloth  6 each Topical Q0600  . mupirocin ointment  1 application Nasal BID  . sodium chloride  3 mL Intravenous Q12H   . sodium chloride Stopped (12/10/14 0800)  . sodium chloride 50 mL/hr at 12/10/14 0800  . DOPamine Stopped (12/08/14 1100)  . heparin 850 Units/hr (12/09/14 2000)   OBJECTIVE  Filed Vitals:   12/10/14 0600 12/10/14 0700 12/10/14 0758 12/10/14 0800  BP: 142/84 137/85 137/85 131/77  Pulse: 58 57 59 58  Temp:   97.2 F (36.2 [name])   TempSrc:   Oral   Resp: 15 13 12 13   Height:      Weight: 155 lb 10.3 oz (70.6 kg)     SpO2: 100% 100% 100% 100%    Intake/Output Summary (Last 24 hours) at 12/10/14 0908 Last data filed at 12/10/14 0800  Gross per 24 hour  Intake 1072.74 ml  Output   1100 ml  Net -27.26 ml   Filed Weights   12/07/14 1302 12/10/14 0600  Weight: 159 lb (72.122 kg) 155 lb 10.3 oz (70.6 kg)    PHYSICAL EXAM  General: Pleasant, NAD. Neuro: Alert and oriented X 1. Moves all extremities spontaneously. Psych: Normal affect. HEENT:  Normal  Neck: Supple without bruits or JVD. Lungs:  Resp regular and unlabored, CTA. Heart: RRR no s3, s4, or murmurs. Abdomen: Soft, non-tender, non-distended, BS + x 4.  Extremities: No clubbing, cyanosis or edema. DP/PT/Radials 2+ and equal bilaterally.  Accessory Clinical Findings  CBC  Recent Labs  12/07/14 1313  12/08/14 0250 12/10/14 0225  WBC 3.8*  --  4.6 3.9*  NEUTROABS 2.0  --    --   --   HGB 12.0*  < > 12.7* 11.9*  HCT 37.4*  < > 38.9* 36.7*  MCV 94.7  --  92.8 92.9  PLT 155  --  155 163  < > = values in this interval not displayed. Basic Metabolic Panel  Recent Labs  12/07/14 1313 12/07/14 1316 12/08/14 0250  NA 140 142 138  K 3.6 3.6 3.7  CL 105 105 103  CO2 25  --  26  GLUCOSE 123* 122* 131*  BUN 10 11 10   CREATININE 1.20 1.10 0.98  CALCIUM 9.3  --  9.5   Liver Function Tests  Recent Labs  12/07/14 1313  AST 36  ALT 7  ALKPHOS 42  BILITOT 0.8  PROT 6.6  ALBUMIN 3.6   Cardiac Enzymes  Recent Labs  12/07/14 1457 12/07/14 1937 12/08/14 0250  TROPONINI <0.03 <0.03 <0.03   Radiology/Studies  Ct Head Wo Contrast  12/07/2014   CLINICAL DATA:  Unresponsive, hypertension, bradycardia. Acute chest pain.  IMPRESSION: No acute intracranial finding by noncontrast CT.  Stable brain atrophy, chronic white matter microvascular ischemic changes, and pituitary macroadenoma.     Dg Chest Port 1 View  12/07/2014   CLINICAL DATA:  Chest pain, unresponsiveness  EXAM: PORTABLE  CHEST - 1 VIEW  COMPARISON:  01/31/2014  FINDINGS: Cardiac shadow is stable. The lungs are well aerated bilaterally. No focal infiltrate is seen. Monitoring devices are noted over the chest.  IMPRESSION: No acute abnormality noted.     TELE: SR, frequent PVCs - HR in the 73's  ASSESSMENT AND PLAN  81 year old gentleman  1. Symptomatic bradycardia - in 40-50', with frequent PVCs followed by up to 2 seconds pauses, leading into syncope, requiring support with pressors, epinephrine switched to dopamine currently at 3 mcg/kg/minute.  He has ruled-out for NSTEMI, but suspicious for CAD. Had multivessel, non-obstructive CAD by cath in 2008.    - plan LHC today  - if no obstructive CAD, will likely need a pacemaker  2. Chest pain - sounds typical, followed by symptomatic bradycardia and syncope.  Troponin is negative x 3, however, INR has not budged after holding warfarin.   - INR  down to 1.72, ok for LHC today  3. Recurrent pulmonary embolism - on chronic anticoagulation with Coumadin, will check INR and start heparin as needed. We'll hold Coumadin for possible cardiac catheterization.   -on IV heparin for bridging  4. Hypertension - controlled and we will hold his home hypertensive meds.  5. Hyperlipidemia - on atorvastatin 40 mg daily.  DVT PPX - on chronic Coumadin, INR today 2.13  [name] Casino, MD, [facility] Attending Cardiologist [facility]  [name] [name]  12/10/2014

## 2014-12-10 NOTE — Progress Notes (Signed)
Site area: right groin a 5 french arterial sheath was removed  Site Prior to Removal:  Level 0  Pressure Applied For 30 MINUTES    Minutes Beginning at 1715  Manual:   Yes.    Patient Status During Pull:  stable  Post Pull Groin Site:  Level 0  Post Pull Instructions Given:  Yes.    Post Pull Pulses Present:  Yes.    Dressing Applied:  Yes.    Comments:  VS remain stablel during sheath pull.  Pt disoriented.  Pressure dressing applied and right leg sheeted.

## 2014-12-10 NOTE — Progress Notes (Signed)
Patient Name: Quill Grinder Date of Encounter: 12/10/2014  Active Problems:   Symptomatic bradycardia   Syncope   Chest pain with high risk of acute coronary syndrome   Hypertension   Hyperlipidemia   Recurrent pulmonary embolism   Anticoagulated on Coumadin   Syncope and collapse   Length of Stay: 3  SUBJECTIVE  No events overnight. Denies any chest pain. HR remains in the 60's. Plan for LHC today.  CURRENT MEDS . aspirin EC  81 mg Oral Daily  . atorvastatin  40 mg Oral q1800  . Chlorhexidine Gluconate Cloth  6 each Topical Q0600  . mupirocin ointment  1 application Nasal BID  . sodium chloride  3 mL Intravenous Q12H   . sodium chloride Stopped (12/10/14 0800)  . sodium chloride 50 mL/hr at 12/10/14 0800  . DOPamine Stopped (12/08/14 1100)  . heparin 850 Units/hr (12/09/14 2000)   OBJECTIVE  Filed Vitals:   12/10/14 0600 12/10/14 0700 12/10/14 0758 12/10/14 0800  BP: 142/84 137/85 137/85 131/77  Pulse: 58 57 59 58  Temp:   97.2 F (36.2 C)   TempSrc:   Oral   Resp: 15 13 12 13   Height:      Weight: 155 lb 10.3 oz (70.6 kg)     SpO2: 100% 100% 100% 100%    Intake/Output Summary (Last 24 hours) at 12/10/14 0908 Last data filed at 12/10/14 0800  Gross per 24 hour  Intake 1072.74 ml  Output   1100 ml  Net -27.26 ml   Filed Weights   12/07/14 1302 12/10/14 0600  Weight: 159 lb (72.122 kg) 155 lb 10.3 oz (70.6 kg)    PHYSICAL EXAM  General: Pleasant, NAD. Neuro: Alert and oriented X 1. Moves all extremities spontaneously. Psych: Normal affect. HEENT:  Normal  Neck: Supple without bruits or JVD. Lungs:  Resp regular and unlabored, CTA. Heart: RRR no s3, s4, or murmurs. Abdomen: Soft, non-tender, non-distended, BS + x 4.  Extremities: No clubbing, cyanosis or edema. DP/PT/Radials 2+ and equal bilaterally.  Accessory Clinical Findings  CBC  Recent Labs  12/07/14 1313  12/08/14 0250 12/10/14 0225  WBC 3.8*  --  4.6 3.9*  NEUTROABS 2.0  --    --   --   HGB 12.0*  < > 12.7* 11.9*  HCT 37.4*  < > 38.9* 36.7*  MCV 94.7  --  92.8 92.9  PLT 155  --  155 163  < > = values in this interval not displayed. Basic Metabolic Panel  Recent Labs  12/07/14 1313 12/07/14 1316 12/08/14 0250  NA 140 142 138  K 3.6 3.6 3.7  CL 105 105 103  CO2 25  --  26  GLUCOSE 123* 122* 131*  BUN 10 11 10   CREATININE 1.20 1.10 0.98  CALCIUM 9.3  --  9.5   Liver Function Tests  Recent Labs  12/07/14 1313  AST 36  ALT 7  ALKPHOS 42  BILITOT 0.8  PROT 6.6  ALBUMIN 3.6   Cardiac Enzymes  Recent Labs  12/07/14 1457 12/07/14 1937 12/08/14 0250  TROPONINI <0.03 <0.03 <0.03   Radiology/Studies  Ct Head Wo Contrast  12/07/2014   CLINICAL DATA:  Unresponsive, hypertension, bradycardia. Acute chest pain.  IMPRESSION: No acute intracranial finding by noncontrast CT.  Stable brain atrophy, chronic white matter microvascular ischemic changes, and pituitary macroadenoma.     Dg Chest Port 1 View  12/07/2014   CLINICAL DATA:  Chest pain, unresponsiveness  EXAM: PORTABLE  CHEST - 1 VIEW  COMPARISON:  01/31/2014  FINDINGS: Cardiac shadow is stable. The lungs are well aerated bilaterally. No focal infiltrate is seen. Monitoring devices are noted over the chest.  IMPRESSION: No acute abnormality noted.     TELE: SR, frequent PVCs - HR in the 44's  ASSESSMENT AND PLAN  81 year old gentleman  1. Symptomatic bradycardia - in 40-50', with frequent PVCs followed by up to 2 seconds pauses, leading into syncope, requiring support with pressors, epinephrine switched to dopamine currently at 3 mcg/kg/minute.  He has ruled-out for NSTEMI, but suspicious for CAD. Had multivessel, non-obstructive CAD by cath in 2008.    - plan LHC today  - if no obstructive CAD, will likely need a pacemaker  2. Chest pain - sounds typical, followed by symptomatic bradycardia and syncope.  Troponin is negative x 3, however, INR has not budged after holding warfarin.   - INR  down to 1.72, ok for LHC today  3. Recurrent pulmonary embolism - on chronic anticoagulation with Coumadin, will check INR and start heparin as needed. We'll hold Coumadin for possible cardiac catheterization.   -on IV heparin for bridging  4. Hypertension - controlled and we will hold his home hypertensive meds.  5. Hyperlipidemia - on atorvastatin 40 mg daily.  DVT PPX - on chronic Coumadin, INR today 2.13  Pixie Casino, MD, [facility] Attending Cardiologist [facility]  Stanley Helmuth C  12/10/2014

## 2014-12-11 DIAGNOSIS — Q245 Malformation of coronary vessels: Secondary | ICD-10-CM

## 2014-12-11 LAB — CBC
HCT: 32.6 % — ABNORMAL LOW (ref 39.0–52.0)
HEMOGLOBIN: 10.6 g/dL — AB (ref 13.0–17.0)
MCH: 30 pg (ref 26.0–34.0)
MCHC: 32.5 g/dL (ref 30.0–36.0)
MCV: 92.4 fL (ref 78.0–100.0)
Platelets: 154 10*3/uL (ref 150–400)
RBC: 3.53 MIL/uL — AB (ref 4.22–5.81)
RDW: 15 % (ref 11.5–15.5)
WBC: 3.8 10*3/uL — ABNORMAL LOW (ref 4.0–10.5)

## 2014-12-11 LAB — HEPARIN LEVEL (UNFRACTIONATED): Heparin Unfractionated: <0.1 IU/mL — ABNORMAL LOW (ref 0.30–0.70)

## 2014-12-11 LAB — PROTIME-INR
INR: 1.37 (ref 0.00–1.49)
Prothrombin Time: 17 seconds — ABNORMAL HIGH (ref 11.6–15.2)

## 2014-12-11 MED ORDER — AMLODIPINE BESYLATE 2.5 MG PO TABS
2.5000 mg | ORAL_TABLET | Freq: Every day | ORAL | Status: DC
Start: 2014-12-11 — End: 2014-12-18
  Administered 2014-12-11 – 2014-12-18 (×8): 2.5 mg via ORAL
  Filled 2014-12-11 (×8): qty 1

## 2014-12-11 MED ORDER — HEPARIN (PORCINE) IN NACL 100-0.45 UNIT/ML-% IJ SOLN
850.0000 [IU]/h | INTRAMUSCULAR | Status: DC
  Administered 2014-12-11: 850 [IU]/h via INTRAVENOUS
  Filled 2014-12-11: qty 250

## 2014-12-11 MED ORDER — WARFARIN - PHARMACIST DOSING INPATIENT
Freq: Every day | Status: DC
  Administered 2014-12-11 – 2014-12-13 (×2)

## 2014-12-11 MED ORDER — WARFARIN SODIUM 10 MG PO TABS
10.0000 mg | ORAL_TABLET | Freq: Once | ORAL | Status: AC
  Administered 2014-12-11: 10 mg via ORAL
  Filled 2014-12-11: qty 1

## 2014-12-11 MED ORDER — PANTOPRAZOLE SODIUM 40 MG PO TBEC
40.0000 mg | DELAYED_RELEASE_TABLET | Freq: Every day | ORAL | Status: DC
  Administered 2014-12-11 – 2014-12-18 (×8): 40 mg via ORAL
  Filled 2014-12-11 (×8): qty 1

## 2014-12-11 MED ORDER — DIAZEPAM 5 MG PO TABS: 5.0000 mg | ORAL_TABLET | Freq: Four times a day (QID) | ORAL | Status: DC | PRN

## 2014-12-11 MED ORDER — HEPARIN BOLUS VIA INFUSION
2000.0000 [IU] | Freq: Once | INTRAVENOUS | Status: AC
  Administered 2014-12-11: 2000 [IU] via INTRAVENOUS
  Filled 2014-12-11: qty 2000

## 2014-12-11 MED ORDER — ACETAMINOPHEN 325 MG PO TABS
650.0000 mg | ORAL_TABLET | ORAL | Status: DC | PRN
  Administered 2014-12-13 – 2014-12-17 (×3): 650 mg via ORAL
  Filled 2014-12-11 (×3): qty 2

## 2014-12-11 MED ORDER — CARBIDOPA-LEVODOPA 25-100 MG PO TABS
1.0000 | ORAL_TABLET | Freq: Two times a day (BID) | ORAL | Status: DC
  Administered 2014-12-11 – 2014-12-18 (×14): 1 via ORAL
  Filled 2014-12-11 (×14): qty 1

## 2014-12-11 MED ORDER — MEMANTINE HCL ER 28 MG PO CP24
28.0000 mg | ORAL_CAPSULE | ORAL | Status: DC
  Administered 2014-12-11 – 2014-12-17 (×7): 28 mg via ORAL
  Filled 2014-12-11 (×9): qty 1

## 2014-12-11 MED ORDER — ONDANSETRON HCL 4 MG/2ML IJ SOLN: 4.0000 mg | Freq: Four times a day (QID) | INTRAMUSCULAR | Status: DC | PRN

## 2014-12-11 NOTE — Progress Notes (Signed)
Patient Name: [name] [name] Date of Encounter: 12/11/2014  Principal Problem:   Syncope and collapse Active Problems:   Symptomatic bradycardia   Syncope   Chest pain with high risk of acute coronary syndrome   Hypertension   Hyperlipidemia   Recurrent pulmonary embolism   Anticoagulated on Coumadin   Length of Stay: 4  SUBJECTIVE  No events overnight. LHC shows moderate LAD disease with an 80% bridging segment, that responded to IC nitroglycerin.  Some agitation overnight - given low dose IV ativan.   CURRENT MEDS . aspirin EC  81 mg Oral Daily  . atorvastatin  40 mg Oral q1800  . Chlorhexidine Gluconate Cloth  6 each Topical Q0600  . isosorbide mononitrate  30 mg Oral Daily  . mupirocin ointment  1 application Nasal BID  . sodium chloride  3 mL Intravenous Q12H   . sodium chloride Stopped (12/10/14 0800)  . sodium chloride Stopped (12/10/14 1803)  . sodium chloride Stopped (12/11/14 0205)  . DOPamine Stopped (12/08/14 1100)   OBJECTIVE  Filed Vitals:   12/11/14 0500 12/11/14 0600 12/11/14 0700 12/11/14 0800  BP: 110/65 143/73 136/92 119/73  Pulse: 73 80 73 73  Temp:   97.5 F (36.4 C)   TempSrc:   Oral   Resp: 18 17 14 13   Height:      Weight:      SpO2: 100% 100% 100% 100%    Intake/Output Summary (Last 24 hours) at 12/11/14 0854 Last data filed at 12/11/14 0400  Gross per 24 hour  Intake 2083.92 ml  Output    850 ml  Net 1233.92 ml   Filed Weights   12/07/14 1302 12/10/14 0600 12/11/14 0300  Weight: 159 lb (72.122 kg) 155 lb 10.3 oz (70.6 kg) 155 lb 6.8 oz (70.5 kg)    PHYSICAL EXAM  General: Pleasant, NAD. Neuro: Alert and oriented X 1. Moves all extremities spontaneously. Psych: Normal affect. HEENT:  Normal  Neck: Supple without bruits or JVD. Lungs:  Resp regular and unlabored, CTA. Heart: RRR no s3, s4, or murmurs. Abdomen: Soft, non-tender, non-distended, BS + x 4.  Extremities: No clubbing, cyanosis or edema. DP/PT/Radials 2+ and  equal bilaterally.  Accessory Clinical Findings  CBC  Recent Labs  12/10/14 0225 12/11/14 0229  WBC 3.9* 3.8*  HGB 11.9* 10.6*  HCT 36.7* 32.6*  MCV 92.9 92.4  PLT 163 494   Basic Metabolic Panel No results for input(s): NA, K, CL, CO2, GLUCOSE, BUN, CREATININE, CALCIUM, MG, PHOS in the last 72 hours. Liver Function Tests No results for input(s): AST, ALT, ALKPHOS, BILITOT, PROT, ALBUMIN in the last 72 hours. Cardiac Enzymes No results for input(s): CKTOTAL, CKMB, CKMBINDEX, TROPONINI in the last 72 hours. Radiology/Studies  Ct Head Wo Contrast  12/07/2014   CLINICAL DATA:  Unresponsive, hypertension, bradycardia. Acute chest pain.  IMPRESSION: No acute intracranial finding by noncontrast CT.  Stable brain atrophy, chronic white matter microvascular ischemic changes, and pituitary macroadenoma.     Dg Chest Port 1 View  12/07/2014   CLINICAL DATA:  Chest pain, unresponsiveness  EXAM: PORTABLE CHEST - 1 VIEW  COMPARISON:  01/31/2014  FINDINGS: Cardiac shadow is stable. The lungs are well aerated bilaterally. No focal infiltrate is seen. Monitoring devices are noted over the chest.  IMPRESSION: No acute abnormality noted.     TELE: SR, frequent PVCs - HR in the 64's  ASSESSMENT AND PLAN  81 year old gentleman  1. Symptomatic bradycardia - in 40-50', with frequent PVCs followed  by up to 2 seconds pauses, leading into syncope, requiring support with pressors, epinephrine switched to dopamine currently at 3 mcg/kg/minute.  He has ruled-out for NSTEMI, but suspicious for CAD. Had multivessel, non-obstructive CAD by cath in 2008.    - LHC shows moderate LAD disease with a bridging 80% stenosis that improved to 20% with IC nitroglycerin - I do not feel the LAD disease is responsible for his syncopal episodes. I think a pacer is indicated - will as EP for their opinion today.  2. Chest pain - sounds typical, followed by symptomatic bradycardia and syncope.  Troponin is negative x  3.  -LHC as above - will add CCB for bridging segment.  -restarted warfarin for history of recurrent PE  3. Recurrent pulmonary embolism - on chronic anticoagulation with Coumadin, will check INR and start heparin as needed. We'll hold Coumadin for possible cardiac catheterization.   -on IV heparin for bridging  -warfarin restarted  4. Hypertension - controlled and we will hold his home hypertensive meds.  5. Hyperlipidemia - on atorvastatin 40 mg daily.  Ok to transfer to telemetry status today.  Pixie Casino, MD, [facility] Attending Cardiologist [facility]  Pixie Casino  12/11/2014

## 2014-12-11 NOTE — Consult Note (Signed)
ELECTROPHYSIOLOGY CONSULT NOTE    Patient ID: [name] [name] MRN: [id number], DOB/AGE: 1930-11-03 81 y.o.  Admit date: 12/07/2014 Date of Consult: 12/12/2014  Primary Physician: [name], DO Primary Cardiologist: Meda Coffee (new this admission)  Reason for Consultation: syncope and bradycardia  HPI:  [name] [name] is a 81 y.o. male with a past medical history of recurrent PE (on long-term Warfarin), hypertension, prostate cancer, and dementia.  He was admitted on 12-07-14 following a syncopal spell at home. He has been given Ativan this morning and his daughter provides the history.  He was sitting at the breakfast table and she came down and noticed he was bent over the table.  He was clammy and his eyes rolled back in his head and she lowered him to the floor when he passed out.  Lying down, he recovered some but not completely.  EMS was called and on their arrival, HR was in the 40's.  He was temporarily transcutaneously paced and brought to [facility] for further evaluation.  He was placed on dopamine for symptomatic bradycardia with improvement in heart rates that has been subsequently weaned off.  Catheterization yesterday demonstrated moderate LAD disease with a bridging 80% stenosis that responded to NTG.  It is not felt that the LAD disease is responsible for syncopal episode.  EF by cath was 50-55%, EF by echo this admission 40-45%.   He has underlying conduction system disease with RBBB, 1st degree AV block and prior symptomatic bradycardia in May of 2014 that improved off of Aricept.   His daughter states that he has not had recent fevers, chills, shortness of breath, GI symptoms.  ROS is otherwise negative except as outlined above.   Past Medical History  Diagnosis Date  . PE (pulmonary embolism)     unprovoked PE completed 6 months of warfarin, warfarin d/ced 02/12/2010, repeat PE 07/10/2010 after a long car ride and now on lifelong coumadin.  [name] Hypertension   . Pituitary  microadenoma     incidental finding CT 12/2009  . Prostate cancer   . Depression   . Hyperlipidemia   . Coronary artery disease, non-occlusive     with history of MI; Cath 2008 w multivessel nonobstructive CAD  . Dementia      Surgical History:  Past Surgical History  Procedure Laterality Date  . Prostatectomy    . Left heart catheterization with coronary angiogram N/A 12/10/2014    Procedure: LEFT HEART CATHETERIZATION WITH CORONARY ANGIOGRAM;  Surgeon: [name], MD;  Location: [facility] CATH LAB;  Service: Cardiovascular;  Laterality: N/A;     Prescriptions prior to admission  Medication Sig Dispense Refill Last Dose  . amLODipine (NORVASC) 10 MG tablet TAKE 1 TABLET BY MOUTH DAILY 30 tablet 3 12/07/2014 at Unknown time  . atorvastatin (LIPITOR) 40 MG tablet TAKE 1 TABLET (40 MG TOTAL) BY MOUTH EVERY EVENING. 90 tablet 1 12/06/2014 at Unknown time  . b complex vitamins tablet Take 1 tablet by mouth daily.   12/07/2014 at Unknown time  . Calcium Carbonate-Vitamin D (CALCIUM-VITAMIN D) 500-200 MG-UNIT per tablet Take 1 tablet by mouth daily.   12/07/2014 at Unknown time  . carbidopa-levodopa (SINEMET IR) 25-100 MG per tablet Take 1 tablet by mouth 3 (three) times daily. (Patient taking differently: Take 1 tablet by mouth 2 (two) times daily. ) 270 tablet 0 12/07/2014 at Unknown time  . fish oil-omega-3 fatty acids 1000 MG capsule Take 2 g by mouth daily.    12/07/2014 at Unknown time  .  lisinopril (PRINIVIL,ZESTRIL) 40 MG tablet Take 1 tablet (40 mg total) by mouth daily. 90 tablet 3 12/07/2014 at Unknown time  . Multiple Vitamins-Minerals (MULTIVITAMIN WITH MINERALS) tablet Take 1 tablet by mouth daily.   12/07/2014 at Unknown time  . NAMENDA XR 28 MG CP24 Take 28 mg by mouth daily. 30 capsule 12 12/07/2014 at Unknown time  . nortriptyline (PAMELOR) 10 MG capsule Take 10 mg by mouth as needed.   12/06/2014 at Unknown time  . omeprazole (PRILOSEC) 20 MG capsule Take 1 capsule (20 mg total) by mouth  daily. 90 capsule 1 12/07/2014 at Unknown time  . warfarin (COUMADIN) 5 MG tablet Take 1 tablet (5mg ) by mouth on Monday, Tuesday, Thursday, Friday and Saturday.  Take 1.5 tablets (7.5mg ) by mouth on Sunday and Wednesday. 40 tablet 1 12/06/2014 at 1700    Inpatient Medications:  . amLODipine  2.5 mg Oral Daily  . aspirin EC  81 mg Oral Daily  . atorvastatin  40 mg Oral q1800  . carbidopa-levodopa  1 tablet Oral BID  . Chlorhexidine Gluconate Cloth  6 each Topical Q0600  . isosorbide mononitrate  30 mg Oral Daily  . memantine  28 mg Oral Q24H  . pantoprazole  40 mg Oral Daily  . warfarin  10 mg Oral ONCE-1800  . Warfarin - Pharmacist Dosing Inpatient   Does not apply q1800    Allergies:  Allergies  Allergen Reactions  . Aricept [Donepezil Hcl] Other (See Comments)    Symptomatic Bradycardia.  . Other Other (See Comments)    Shrimp gives him gout    History   Social History  . Marital Status: Married    Spouse Name: N/A  . Number of Children: 4  . Years of Education: master's   Occupational History  .      retired   Social History Main Topics  . Smoking status: Never Smoker   . Smokeless tobacco: Never Used  . Alcohol Use: No  . Drug Use: No  . Sexual Activity: Yes   Other Topics Concern  . Not on file   Social History Narrative   Lives with daughter. Wife also has severe dementia.      Family History  Problem Relation Age of Onset  . Hypertension Father     Passed away from cerebral hemorrhage at age of 60.  [name] Dementia Mother     Passed away  . Hypertension Child     4 adult children  . Cervical cancer Other   . Lung cancer Other   . Stroke Other      Physical Exam: Filed Vitals:   12/11/14 1912 12/12/14 0412 12/12/14 1100 12/12/14 1229  BP: 113/74 106/70 111/75 121/79  Pulse: 74 74 75   Temp: 98.5 F (36.9 C) 98.8 F (37.1 C)    TempSrc: Oral Oral    Resp:      Height:  5\' 10"  (1.778 m)    Weight:  149 lb 11.2 oz (67.903 kg)    SpO2: 96% 94%       GEN- The patient is sleeping and sedated   HEENT: normocephalic, atraumatic Lymph- no cervical lymphadenopathy Lungs- Clear to ausculation bilaterally, normal work of breathing.  No wheezes, rales, rhonchi Heart- Regular rate and rhythm, no murmurs, rubs or gallops GI- soft, non-tender, non-distended, bowel sounds present Extremities- no clubbing, cyanosis, or edema; DP/PT/radial pulses 2+ bilaterally MS- no significant deformity or atrophy Skin- warm and dry, no rash or lesion Psych- euthymic mood, full  affect   Labs:   Lab Results  Component Value Date   WBC 4.5 12/12/2014   HGB 10.4* 12/12/2014   HCT 32.2* 12/12/2014   MCV 94.2 12/12/2014   PLT 143* 12/12/2014    Recent Labs Lab 12/07/14 1313  12/08/14 0250  NA 140  < > 138  K 3.6  < > 3.7  CL 105  < > 103  CO2 25  --  26  BUN 10  < > 10  CREATININE 1.20  < > 0.98  CALCIUM 9.3  --  9.5  PROT 6.6  --   --   BILITOT 0.8  --   --   ALKPHOS 42  --   --   ALT 7  --   --   AST 36  --   --   GLUCOSE 123*  < > 131*  < > = values in this interval not displayed.    Radiology/Studies: Ct Head Wo Contrast 12/07/2014   CLINICAL DATA:  Unresponsive, hypertension, bradycardia. Acute chest pain.  EXAM: CT HEAD WITHOUT CONTRAST  TECHNIQUE: Contiguous axial images were obtained from the base of the skull through the vertex without contrast.  COMPARISON:  03/14/2013  FINDINGS: Global brain atrophy evident with chronic white matter microvascular ischemic changes throughout the cerebral white matter. No acute intracranial hemorrhage, mass lesion, definite infarction, midline shift, herniation, hydrocephalus, or extra-axial fluid collection. No focal mass effect or edema. Cisterns are patent. Cerebellar atrophy as well. Stable chronic pituitary mass compatible with a known macroadenoma, image 10. This measures 19 mm.  IMPRESSION: No acute intracranial finding by noncontrast CT.  Stable brain atrophy, chronic white matter microvascular  ischemic changes, and pituitary macroadenoma.   Electronically Signed   By: Jerilynn Mages.  Shick M.D.   On: 12/07/2014 14:06   Dg Chest Port 1 View 12/07/2014   CLINICAL DATA:  Chest pain, unresponsiveness  EXAM: PORTABLE CHEST - 1 VIEW  COMPARISON:  01/31/2014  FINDINGS: Cardiac shadow is stable. The lungs are well aerated bilaterally. No focal infiltrate is seen. Monitoring devices are noted over the chest.  IMPRESSION: No acute abnormality noted.   Electronically Signed   By: Inez Catalina M.D.   On: 12/07/2014 13:37   IBB:CWUGQ brady, RBBB, 1st degree AV block  TELEMETRY: sinus rhythm, PVC's  Assessment/Plan: 1. Syncope with symptomatic bradycardia The patient has syncope with documented bradycardia. With underlying conduction system disease and previous symptomatic bradycardia that improved off Aricept, pacemaker implantation is recommended at this time.  DrTaylor discussed with the patient's daughter who would like to pursue pacemaker implantation. The patient is sedated this morning and unable to have conversation about risks/benefits or give other history.  Will tentatively plan for pacemaker implantation tomorrow and discuss again with patient and daughter in the morning.  2. CAD  Troponin negative this admission Catheterization demonstrated myocardial bridging that improved with IC NTG.  Calcium channel blocker felt to be needed for long term management.    3.  Recurrent PE On chronic anticoagulation with Coumadin Coumadin held for cath, INR subtherapeutic this morning. Dosing per pharmacy  4.  HTN Stable No change required today   Signed, Chanetta Marshall, NP 12/12/2014 2:42 PM   On heparin at this moment so will NOT be able to proceed INR 1.3  Have had long discussion re syncopal episode and obatined EMS data showing BP70 with HR 40 and the latter addressed with pacing had no impact on the former requiring epi  This speaks to a  vasomotor episode as opposed to primary arrhythmia, this not  withstanding the RBBB   Data suggest taht 1/2 of these people will have arrhythmic events on monitoring, and the PRESS trial ahs ssuggested a decrease in frequency of syncope with pacing  I spent 45 min with his Dtr discussing the options, and she ahs opted for pacing which is reasonable  However it is more safely done on coumadin than heparin and with Hx of PE recurrent, there is no need to take extra risk with no anticoagulation for week or so s  Hence resume coumadin  OT and PT consults   Pacer next week either as inpt or outpt

## 2014-12-11 NOTE — Progress Notes (Signed)
During bath at 0600 pt verbally abusive towards RN and NT. Pt yelled "get out of here" and "don't touch me." Only able to do a partial bath and wipe with CHG wipes. Pts family wanted RN to shave pt, but with noncompliance of pt, RN unable to shave pt face. Will pass information along to day nurse so she may inform pts family why pt is not shaved.

## 2014-12-11 NOTE — Progress Notes (Signed)
[place] for heparin/warfarin Indication: recurrent PE  Allergies  Allergen Reactions  . Aricept [Donepezil Hcl] Other (See Comments)    Symptomatic Bradycardia.  . Other Other (See Comments)    Shrimp gives him gout    Patient Measurements: Height: 5\' 10"  (177.8 cm) Weight: 155 lb 6.8 oz (70.5 kg) IBW/kg (Calculated) : 73 Heparin Dosing Weight: 70kg  Vital Signs: Temp: 97.5 F (36.4 C) (03/23 0700) Temp Source: Oral (03/23 0700) BP: 119/73 mmHg (03/23 0800) Pulse Rate: 73 (03/23 0800)  Labs:  Recent Labs  12/09/14 1258 12/09/14 2310 12/10/14 0225 12/11/14 0229  HGB  --   --  11.9* 10.6*  HCT  --   --  36.7* 32.6*  PLT  --   --  163 154  LABPROT 21.5*  --  20.3* 17.0*  INR 1.85*  --  1.72* 1.37  HEPARINUNFRC  --  0.35 0.49 <0.10*    Estimated Creatinine Clearance: 58.9 mL/min (by C-G formula based on Cr of 0.98).   Medical History: Past Medical History  Diagnosis Date  . PE (pulmonary embolism)     unprovoked PE completed 6 months of warfarin, warfarin d/ced 02/12/2010, repeat PE 07/10/2010 after a long car ride and now on lifelong coumadin.  [name] Hypertension   . Pituitary microadenoma     incidental finding CT 12/2009  . Prostate cancer   . Depression   . Hyperlipidemia   . Coronary artery disease, non-occlusive     with history of MI; Cath 2008 w multivessel nonobstructive CAD  . Dementia    Assessment: 81 year old male presents to [facility] with chest pain and symptomatic bradycardia leading to syncope. Patient has history of recurrent PE on chronic coumadin. INR was therapeutic 2.2 on admit. CT head done on 3/19 showed no bleeding. 5mg  IV Vit k total given 3/21.  Cath performed 3/22, single vessel CAD involving LAD with some bridging/narrowing to 80%, no PCI performed and medical mgmt decided upon. Heparin and warfarin bridge ordered to restart today post cath.  Home dose of warfarin is 5mg  daily except 7.5mg  on  sun/wed.   With vit k given 3/21, patient may be a slow responder to warfarin re-initiation. Will give high dose tonight.  Goal of Therapy:  Heparin level goal 0.3-0.7 INR 2-3 Monitor platelets by anticoagulation protocol: Yes   Plan:  Give bolus of 2000 units x1 this am Restart heparin at 850 units/hr  Daily CBC/HL  Warfarin 10mg  tonight  Erin Hearing PharmD., BCPS Clinical Pharmacist Pager [phone] 12/11/2014 10:16 AM

## 2014-12-12 ENCOUNTER — Telehealth: Payer: Self-pay | Admitting: Pharmacist

## 2014-12-12 ENCOUNTER — Encounter (HOSPITAL_COMMUNITY)
Admission: EM | Disposition: A | Discharge status: Home Health | Payer: Self-pay | Source: Home / Self Care | Attending: Cardiology

## 2014-12-12 LAB — CBC
HCT: 32.2 % — ABNORMAL LOW (ref 39.0–52.0)
Hemoglobin: 10.4 g/dL — ABNORMAL LOW (ref 13.0–17.0)
MCH: 30.4 pg (ref 26.0–34.0)
MCHC: 32.3 g/dL (ref 30.0–36.0)
MCV: 94.2 fL (ref 78.0–100.0)
PLATELETS: 143 10*3/uL — AB (ref 150–400)
RBC: 3.42 MIL/uL — ABNORMAL LOW (ref 4.22–5.81)
RDW: 15.1 % (ref 11.5–15.5)
WBC: 4.5 10*3/uL (ref 4.0–10.5)

## 2014-12-12 LAB — HEPARIN LEVEL (UNFRACTIONATED)
HEPARIN UNFRACTIONATED: 0.9 [IU]/mL — AB (ref 0.30–0.70)
Heparin Unfractionated: 0.9 IU/mL — ABNORMAL HIGH (ref 0.30–0.70)

## 2014-12-12 LAB — PROTIME-INR
INR: 1.34 (ref 0.00–1.49)
PROTHROMBIN TIME: 16.7 s — AB (ref 11.6–15.2)

## 2014-12-12 LAB — CLOSTRIDIUM DIFFICILE BY PCR: Toxigenic C. Difficile by PCR: NEGATIVE

## 2014-12-12 SURGERY — PERMANENT PACEMAKER INSERTION

## 2014-12-12 MED ORDER — WARFARIN SODIUM 10 MG PO TABS
10.0000 mg | ORAL_TABLET | Freq: Once | ORAL | Status: AC
  Administered 2014-12-12: 10 mg via ORAL
  Filled 2014-12-12: qty 1

## 2014-12-12 MED ORDER — HEPARIN (PORCINE) IN NACL 100-0.45 UNIT/ML-% IJ SOLN
550.0000 [IU]/h | INTRAMUSCULAR | Status: DC
  Administered 2014-12-12: 700 [IU]/h via INTRAVENOUS
  Filled 2014-12-12 (×2): qty 250

## 2014-12-12 NOTE — Progress Notes (Signed)
Patient found squatting in between bed and chair . Bowel movement on floor. VSS. Patient denied any pain. Neuro assessment at baseline. MD on call and family made aware. Will continue to monitor patient closely.

## 2014-12-12 NOTE — Progress Notes (Signed)
[place] for heparin/warfarin Indication: recurrent PE  Allergies  Allergen Reactions  . Aricept [Donepezil Hcl] Other (See Comments)    Symptomatic Bradycardia.  . Other Other (See Comments)    Shrimp gives him gout    Patient Measurements: Height: 5\' 10"  (177.8 cm) Weight: 149 lb 11.2 oz (67.903 kg) IBW/kg (Calculated) : 73 Heparin Dosing Weight: 70kg  Vital Signs: Temp: 98.8 F (37.1 C) (03/24 0412) Temp Source: Oral (03/24 0412) BP: 106/70 mmHg (03/24 0412) Pulse Rate: 74 (03/24 0412)  Labs:  Recent Labs  12/10/14 0225 12/11/14 0229 12/12/14 0635  HGB 11.9* 10.6* 10.4*  HCT 36.7* 32.6* 32.2*  PLT 163 154 143*  LABPROT 20.3* 17.0* 16.7*  INR 1.72* 1.37 1.34  HEPARINUNFRC 0.49 <0.10* 0.90*    Estimated Creatinine Clearance: 56.8 mL/min (by C-G formula based on Cr of 0.98).   Medical History: Past Medical History  Diagnosis Date  . PE (pulmonary embolism)     unprovoked PE completed 6 months of warfarin, warfarin d/ced 02/12/2010, repeat PE 07/10/2010 after a long car ride and now on lifelong coumadin.  [name] Hypertension   . Pituitary microadenoma     incidental finding CT 12/2009  . Prostate cancer   . Depression   . Hyperlipidemia   . Coronary artery disease, non-occlusive     with history of MI; Cath 2008 w multivessel nonobstructive CAD  . Dementia    Assessment: AM heparin level = 0.9 on heparin rate of 850 units/hr in this 81 year old male.  RN reports appears level was drawn from RUE and the heparin is infusing in LUE.  No bleeding reported.   This patient presented to [facility] with chest pain and symptomatic bradycardia leading to syncope. Patient has history of recurrent PE on chronic coumadin. INR was therapeutic 2.2 on admit. CT head done on 3/19 showed no bleeding. 5mg  IV Vit k total given 3/21. Cath performed 3/22, single vessel CAD involving LAD with some bridging/narrowing to 80%, no PCI performed and medical  mgmt decided upon. Currently on heparin and warfarin bridge.   Goal of Therapy:  Heparin level goal 0.3-0.7 INR 2-3 Monitor platelets by anticoagulation protocol: Yes   Plan:  Decrease heparin to 700 units/hr  Check HL in 6 hours. Daily CBC/HL    Nicole Cella, RPh Clinical Pharmacist Pager: [phone]  12/12/2014 7:47 AM

## 2014-12-12 NOTE — Telephone Encounter (Signed)
Call to patient to confirm appointment for 12/16/14 at 10:00 lmtcb

## 2014-12-12 NOTE — Progress Notes (Addendum)
[place] for heparin/warfarin Indication: recurrent PE  Allergies  Allergen Reactions  . Aricept [Donepezil Hcl] Other (See Comments)    Symptomatic Bradycardia.  . Other Other (See Comments)    Shrimp gives him gout    Patient Measurements: Height: 5\' 10"  (177.8 cm) Weight: 149 lb 11.2 oz (67.903 kg) IBW/kg (Calculated) : 73 Heparin Dosing Weight: 70kg  Vital Signs: Temp: 98.8 F (37.1 C) (03/24 0412) Temp Source: Oral (03/24 0412) BP: 106/70 mmHg (03/24 0412) Pulse Rate: 74 (03/24 0412)  Labs:  Recent Labs  12/10/14 0225 12/11/14 0229 12/12/14 0635  HGB 11.9* 10.6* 10.4*  HCT 36.7* 32.6* 32.2*  PLT 163 154 143*  LABPROT 20.3* 17.0* 16.7*  INR 1.72* 1.37 1.34  HEPARINUNFRC 0.49 <0.10* 0.90*    Estimated Creatinine Clearance: 56.8 mL/min (by C-G formula based on Cr of 0.98).   Medical History: Past Medical History  Diagnosis Date  . PE (pulmonary embolism)     unprovoked PE completed 6 months of warfarin, warfarin d/ced 02/12/2010, repeat PE 07/10/2010 after a long car ride and now on lifelong coumadin.  [name] Hypertension   . Pituitary microadenoma     incidental finding CT 12/2009  . Prostate cancer   . Depression   . Hyperlipidemia   . Coronary artery disease, non-occlusive     with history of MI; Cath 2008 w multivessel nonobstructive CAD  . Dementia    Assessment: 81 year old male presents to [facility] with chest pain and symptomatic bradycardia leading to syncope. Patient has history of recurrent PE on chronic coumadin. INR was therapeutic 2.2 on admit. CT head done on 3/19 showed no bleeding. 5mg  IV Vit k total given 3/21. Cath performed 3/22, single vessel CAD involving LAD with some bridging/narrowing to 80%, no PCI performed and medical mgmt decided upon. Heparin and warfarin bridge restarted post cath.  INR today is 1.35. Noted plans for pacer placement on 12/13/14.   -Home coumadin dose: 5mg  MTTFSa, 7.5mg   SuWe  Goal of Therapy:  Heparin level goal 0.3-0.7 INR 2-3 Monitor platelets by anticoagulation protocol: Yes   Plan:  -Coumadin 10mg  po today and will consider resuming home regimen in am -Daily PT/INR  Hildred Laser, Pharm D 12/12/2014 10:05 AM  Addendum: heparin -Heparin level is 0.9 after heparin decreased to 700 units/hr   Plan -decrease heparin to 550 units/hr -Heparin level in 8hrs  Hildred Laser, Pharm D 12/12/2014 3:35 PM

## 2014-12-13 ENCOUNTER — Inpatient Hospital Stay (HOSPITAL_COMMUNITY): Payer: Commercial Managed Care - HMO

## 2014-12-13 LAB — CBC
HCT: 31.5 % — ABNORMAL LOW (ref 39.0–52.0)
HEMOGLOBIN: 10.3 g/dL — AB (ref 13.0–17.0)
MCH: 30.2 pg (ref 26.0–34.0)
MCHC: 32.7 g/dL (ref 30.0–36.0)
MCV: 92.4 fL (ref 78.0–100.0)
PLATELETS: 141 10*3/uL — AB (ref 150–400)
RBC: 3.41 MIL/uL — ABNORMAL LOW (ref 4.22–5.81)
RDW: 15.1 % (ref 11.5–15.5)
WBC: 5 10*3/uL (ref 4.0–10.5)

## 2014-12-13 LAB — PROTIME-INR
INR: 1.49 (ref 0.00–1.49)
Prothrombin Time: 18.1 seconds — ABNORMAL HIGH (ref 11.6–15.2)

## 2014-12-13 LAB — HEPARIN LEVEL (UNFRACTIONATED)
Heparin Unfractionated: 0.54 IU/mL (ref 0.30–0.70)
Heparin Unfractionated: 0.55 IU/mL (ref 0.30–0.70)

## 2014-12-13 MED ORDER — WARFARIN SODIUM 7.5 MG PO TABS
7.5000 mg | ORAL_TABLET | Freq: Once | ORAL | Status: AC
  Administered 2014-12-13: 7.5 mg via ORAL
  Filled 2014-12-13: qty 1

## 2014-12-13 NOTE — Progress Notes (Addendum)
ELECTROPHYSIOLOGY ROUNDING NOTE    SUBJECTIVE: The patient is doing well today.  At this time, he denies chest pain, shortness of breath, or any new concerns.  CURRENT MEDICATIONS: . amLODipine  2.5 mg Oral Daily  . aspirin EC  81 mg Oral Daily  . atorvastatin  40 mg Oral q1800  . carbidopa-levodopa  1 tablet Oral BID  . isosorbide mononitrate  30 mg Oral Daily  . memantine  28 mg Oral Q24H  . pantoprazole  40 mg Oral Daily  . Warfarin - Pharmacist Dosing Inpatient   Does not apply q1800   . sodium chloride Stopped (12/10/14 0800)  . sodium chloride Stopped (12/10/14 1803)  . sodium chloride Stopped (12/11/14 0205)  . heparin 550 Units/hr (12/12/14 1624)    OBJECTIVE: Telemetry reveals sinus rhythm   Physical Exam: Filed Vitals:   12/12/14 1229 12/12/14 2150 12/13/14 0000 12/13/14 0454  BP: 121/79 104/70  144/89  Pulse: 72 84  76  Temp: 98 F (36.7 C) 97.9 F (36.6 C) 98.4 F (36.9 C) 98.3 F (36.8 C)  TempSrc: Oral Oral Oral Oral  Resp: 20 22  18   Height:    5\' 10"  (1.778 m)  Weight:    151 lb 9.6 oz (68.765 kg)  SpO2: 96% 94% 95% 97%    Intake/Output Summary (Last 24 hours) at 12/13/14 2130 Last data filed at 12/12/14 1900  Gross per 24 hour  Intake    210 ml  Output      0 ml  Net    210 ml    GEN- The patient is sleeping but arouses HEENT: normocephalic, atraumatic; sclera clear, conjunctiva pink; hearing intact; oropharynx clear; neck supple Lungs- Clear to ausculation bilaterally, normal work of breathing.  No wheezes, rales, rhonchi Heart- Regular rate and rhythm, no murmurs, rubs or gallops, PMI not laterally displaced GI- soft, non-tender, non-distended, bowel sounds present Extremities- no clubbing, cyanosis, or edema; DP/PT/radial pulses 2+ bilaterally MS- no significant deformity or atrophy Skin- warm and dry, no rash or lesion   LABS: CBC:  Recent Labs  12/12/14 0635 12/13/14 0502  WBC 4.5 5.0  HGB 10.4* 10.3*  HCT 32.2* 31.5*    MCV 94.2 92.4  PLT 143* 141*    RADIOLOGY: Ct Head Wo Contrast 12/07/2014   CLINICAL DATA:  Unresponsive, hypertension, bradycardia. Acute chest pain.  EXAM: CT HEAD WITHOUT CONTRAST  TECHNIQUE: Contiguous axial images were obtained from the base of the skull through the vertex without contrast.  COMPARISON:  03/14/2013  FINDINGS: Global brain atrophy evident with chronic white matter microvascular ischemic changes throughout the cerebral white matter. No acute intracranial hemorrhage, mass lesion, definite infarction, midline shift, herniation, hydrocephalus, or extra-axial fluid collection. No focal mass effect or edema. Cisterns are patent. Cerebellar atrophy as well. Stable chronic pituitary mass compatible with a known macroadenoma, image 10. This measures 19 mm.  IMPRESSION: No acute intracranial finding by noncontrast CT.  Stable brain atrophy, chronic white matter microvascular ischemic changes, and pituitary macroadenoma.   Electronically Signed   By: Jerilynn Mages.  Shick M.D.   On: 12/07/2014 14:06   Dg Chest Port 1 View 12/07/2014   CLINICAL DATA:  Chest pain, unresponsiveness  EXAM: PORTABLE CHEST - 1 VIEW  COMPARISON:  01/31/2014  FINDINGS: Cardiac shadow is stable. The lungs are well aerated bilaterally. No focal infiltrate is seen. Monitoring devices are noted over the chest.  IMPRESSION: No acute abnormality noted.   Electronically Signed   By: Linus Mako.D.  On: 12/07/2014 13:37    ASSESSMENT AND PLAN:  Principal Problem:   Syncope and collapse Active Problems:   Symptomatic bradycardia   Syncope   Chest pain with high risk of acute coronary syndrome   Hypertension   Hyperlipidemia   Recurrent pulmonary embolism   Anticoagulated on Coumadin   Myocardial bridge  1.  Symptomatic bradycardia Pacemaker planned when INR is therapeutic If INR therapeutic over weekend, could discharge patient and bring back next week for elective pacemaker implant.  If not therapeutic until Monday, would  plan PPM implant as an inpatient.   2.  CAD Troponin negative this admission Catheterization demonstrated myocardial bridging that improved with IC NTG.  Calcium channel blocker felt to be needed for long term management.  3.  Recurrent PE Heparin bridge to Coumadin Pacemaker when INR therapeutic  4.  HTN Stable, elevated this morning, but trend has been stable No change required today  Will get PT/OT evaluation today   Chanetta Marshall, NP 12/13/2014 7:11 AM  Family increasing leaning towards monitor, which is my bias Will need to keep in hosp until INR therapeutic or discharge on LMWH  Will  Reassess post OT PT evaluation felll last night with confusion

## 2014-12-13 NOTE — Progress Notes (Signed)
ANTICOAGULATION CONSULT NOTE - Follow Up Consult  Pharmacy Consult for heparin Indication: recurrent PE   Labs:  Recent Labs  12/11/14 0229 12/12/14 0635 12/12/14 1355 12/13/14 0045  HGB 10.6* 10.4*  --   --   HCT 32.6* 32.2*  --   --   PLT 154 143*  --   --   LABPROT 17.0* 16.7*  --   --   INR 1.37 1.34  --   --   HEPARINUNFRC <0.10* 0.90* 0.90* 0.54     Assessment/Plan:  81yo male therapeutic on heparin after rate changes. Will continue gtt at current rate and confirm stable with additional level.   Wynona Neat, PharmD, BCPS  12/13/2014,2:50 AM

## 2014-12-13 NOTE — Evaluation (Signed)
Physical Therapy Evaluation Patient Details Name: [name] [name] MRN: [id number] DOB: 1931/03/08 Today's Date: 12/13/2014   History of Present Illness  Pt admit with syncope. Bradycardia and PE as well as AMS.  Clinical Impression  Pt admitted with above diagnosis. Pt currently with functional limitations due to the deficits listed below (see PT Problem List). Pt family want to take pt home.  Pt mod assist and confused today.  Will need HHPT f/u, RW and 3N1.  Probably needs HHOT as well.  Will follow acutely.  Pt will benefit from skilled PT to increase their independence and safety with mobility to allow discharge to the venue listed below.      Follow Up Recommendations Home health PT;Supervision/Assistance - 24 hour    Equipment Recommendations  Rolling walker with 5" wheels;3in1 (PT)    Recommendations for Other Services       Precautions / Restrictions Precautions Precautions: Fall Restrictions Weight Bearing Restrictions: No      Mobility  Bed Mobility Overal bed mobility: Needs Assistance Bed Mobility: Supine to Sit     Supine to sit: Mod assist     General bed mobility comments: Pt needed assist for LEs and elevation of trunk.  Pt needed cues for sequencing movement.    Transfers Overall transfer level: Needs assistance Equipment used: Rolling walker (2 wheeled) Transfers: Sit to/from [facility] Sit to Stand: Mod assist;+2 physical assistance;From elevated surface Stand pivot transfers: Mod assist;+2 physical assistance       General transfer comment: Pt needed assist for power up.  Pt had difficulty achieving full upright posture.  Pt with flexed posture unless constantly cued and even then was still somewhat flexed.  Pt with bent knees and trunk. Pt with BM on arrival therefore got NT to come in and assist PT cleaning pt.  Pt able to stand with constant cues for 5 min to be cleaned needing cues to stand tall to decr his flexion.  Pt with loose  stools and nursing notified.  Pt able to take pivotal steps from bed to recliner using RW for support with  tactile assist for weight shifting and asssit to move RW.    Ambulation/Gait                Stairs            Wheelchair Mobility    Modified Rankin (Stroke Patients Only)       Balance Overall balance assessment: Needs assistance;History of Falls         Standing balance support: Bilateral upper extremity supported;During functional activity Standing balance-Leahy Scale: Poor Standing balance comment: Pt requires UE support for balance.                               Pertinent Vitals/Pain Pain Assessment: No/denies pain  VSS with BP at rest on arrival in supine 139/87.  BP once in chair 145/83.    Home Living Family/patient expects to be discharged to:: Private residence Living Arrangements: Spouse/significant other;Children Available Help at Discharge: Family;Available 24 hours/day Type of Home: House Home Access: Stairs to enter Entrance Stairs-Rails: Psychiatric nurse of Steps: 4 Home Layout: Two level;Bed/bath upstairs Home Equipment: None      Prior Function Level of Independence: Independent               Hand Dominance   Dominant Hand: Right    Extremity/Trunk Assessment   Upper Extremity Assessment:  Defer to OT evaluation           Lower Extremity Assessment: Generalized weakness      Cervical / Trunk Assessment: Kyphotic  Communication   Communication: No difficulties  Cognition Arousal/Alertness: Awake/alert Behavior During Therapy: WFL for tasks assessed/performed Overall Cognitive Status: Impaired/Different from baseline Area of Impairment: Safety/judgement;Awareness;Problem solving;Orientation;Attention Orientation Level: Disoriented to;Place;Time;Situation Current Attention Level: Focused Memory: Decreased short-term memory   Safety/Judgement: Decreased awareness of  safety;Decreased awareness of deficits Awareness: Intellectual Problem Solving: Slow processing;Decreased initiation;Difficulty sequencing;Requires verbal cues;Requires tactile cues General Comments: Pt confused.      General Comments General comments (skin integrity, edema, etc.): Nursing notified of lightly blood tinged pad on arrival.      Exercises        Assessment/Plan    PT Assessment Patient needs continued PT services  PT Diagnosis Generalized weakness   PT Problem List Decreased mobility;Decreased balance;Decreased activity tolerance;Decreased knowledge of use of DME;Decreased safety awareness;Decreased knowledge of precautions;Decreased strength  PT Treatment Interventions DME instruction;Gait training;Stair training;Functional mobility training;Therapeutic activities;Therapeutic exercise;Balance training;Patient/family education   PT Goals (Current goals can be found in the Care Plan section) Acute Rehab PT Goals Patient Stated Goal: unable to state as pt confused, thinks he is at home PT Goal Formulation: With patient Time For Goal Achievement: 12/27/14 Potential to Achieve Goals: Good    Frequency Min 3X/week   Barriers to discharge        Co-evaluation               End of Session Equipment Utilized During Treatment: Gait belt Activity Tolerance: Patient limited by fatigue Patient left: in chair;with call bell/phone within reach;with chair alarm set Nurse Communication: Mobility status         Time: 1028-1100 PT Time Calculation (min) (ACUTE ONLY): 32 min   Charges:   PT Evaluation $Initial PT Evaluation Tier I: 1 Procedure PT Treatments $Therapeutic Activity: 8-22 mins   PT G CodesDenice [name] 2015/01/03, 1:55 PM [place] Chia Mowers,PT Acute Rehabilitation [phone] (pager)

## 2014-12-13 NOTE — Evaluation (Signed)
Occupational Therapy Evaluation Patient Details Name: [name] [name] MRN: [id number] DOB: 1931/06/08 Today's Date: 12/13/2014    History of Present Illness Pt admit with syncope. Bradycardia and PE as well as AMS.   Clinical Impression   PLOF taken from PT note as pt was lethargic during OT session. Pt required mod/max A for bed mobility due to lethargy and required assistance for grooming tasks at EOB due to lethargy and confusion. Pt family plans to take pt home and pt will benefit from [facility] and [facility] at d/c. Pt will benefit from acute OT to progress to Min A level to facilitate return home with family assistance.     Follow Up Recommendations  Home health OT;Supervision/Assistance - 24 hour;Other (comment) ([facility])    Equipment Recommendations  3 in 1 bedside comode    Recommendations for Other Services       Precautions / Restrictions Precautions Precautions: Fall Restrictions Weight Bearing Restrictions: No      Mobility Bed Mobility Overal bed mobility: Needs Assistance Bed Mobility: Supine to Sit;Sit to Supine     Supine to sit: Mod assist Sit to supine: Max assist   General bed mobility comments: Assist needed for bed mobility due to lethargy. Pt required verbal and tactile sequencing.   Transfers                 General transfer comment: Not addressed at this time due to lethargy.          ADL Overall ADL's : Needs assistance/impaired Eating/Feeding: Independent;Sitting   Grooming: Minimal assistance;Sitting   Upper Body Bathing: Minimal assitance;Sitting   Lower Body Bathing: Maximal assistance;Bed level   Upper Body Dressing : Minimal assistance;Sitting   Lower Body Dressing: Total assistance;Bed level                 General ADL Comments: Pt was limited by lethargy and difficult to arouse. Pt sat EOB and became slightly more alert but required min A to follow tasks for grooming. Did not stand at this time due to lethargy and  safety. Per PT note, pt family plans to take him home.      Vision Additional Comments: Difficult to assess due to lethargy          Pertinent Vitals/Pain Pain Assessment: No/denies pain     Hand Dominance Right   Extremity/Trunk Assessment Upper Extremity Assessment Upper Extremity Assessment: Generalized weakness   Lower Extremity Assessment Lower Extremity Assessment: Generalized weakness   Cervical / Trunk Assessment Cervical / Trunk Assessment: Kyphotic   Communication Communication Communication: No difficulties   Cognition Arousal/Alertness: Lethargic;Suspect due to medications Behavior During Therapy: Flat affect Overall Cognitive Status: Difficult to assess                                Home Living Family/patient expects to be discharged to:: Private residence Living Arrangements: Spouse/significant other;Children Available Help at Discharge: Family;Available 24 hours/day Type of Home: House Home Access: Stairs to enter [employer] of Steps: 4 Entrance Stairs-Rails: Right;Left Home Layout: Two level;Bed/bath upstairs Alternate Level Stairs-Number of Steps: 16 Alternate Level Stairs-Rails: Right Bathroom Shower/Tub: Occupational psychologist: Standard     Home Equipment: None          Prior Functioning/Environment Level of Independence: Independent             OT Diagnosis: Generalized weakness;Cognitive deficits   OT Problem List: Decreased strength;Decreased activity  tolerance;Impaired balance (sitting and/or standing);Decreased cognition;Decreased safety awareness   OT Treatment/Interventions: Self-care/ADL training;Therapeutic exercise;Energy conservation;DME and/or AE instruction;Therapeutic activities;Patient/family education;Balance training;Cognitive remediation/compensation    OT Goals(Current goals can be found in the care plan section) Acute Rehab OT Goals Patient Stated Goal: unable to state due to  lethargy OT Goal Formulation: Patient unable to participate in goal setting Time For Goal Achievement: 12/27/14 Potential to Achieve Goals: Good ADL Goals Pt Will Perform Lower Body Bathing: with min assist;sit to/from stand Pt Will Perform Lower Body Dressing: with min assist;sit to/from stand Pt Will Transfer to Toilet: with min assist;ambulating Pt Will Perform Toileting - Clothing Manipulation and hygiene: with min assist;sit to/from stand  OT Frequency: Min 2X/week    End of Session  Activity Tolerance: Patient limited by lethargy Patient left: in bed;with call bell/phone within reach;with bed alarm set   Time: 1535-1550 OT Time Calculation (min): 15 min Charges:  OT General Charges $OT Visit: 1 Procedure OT Evaluation $Initial OT Evaluation Tier I: 1 Procedure G-Codes:    Juluis Rainier 12/14/14, 4:32 PM  Cyndie Chime, OTR/L Occupational Therapist [phone] (pager)

## 2014-12-13 NOTE — Progress Notes (Signed)
Pt c/o pain at shoulder. Tylenol given. MD paged to assess whether Xray is needed at this time. Awaiting new orders.

## 2014-12-13 NOTE — Progress Notes (Addendum)
[place] for heparin/warfarin Indication: recurrent PE  Allergies  Allergen Reactions  . Aricept [Donepezil Hcl] Other (See Comments)    Symptomatic Bradycardia.  . Other Other (See Comments)    Shrimp gives him gout    Patient Measurements: Height: 5\' 10"  (177.8 cm) Weight: 151 lb 9.6 oz (68.765 kg) IBW/kg (Calculated) : 73 Heparin Dosing Weight: 70kg  Vital Signs: Temp: 98.3 F (36.8 C) (03/25 0454) Temp Source: Oral (03/25 0454) BP: 114/72 mmHg (03/25 0958) Pulse Rate: 76 (03/25 0454)  Labs:  Recent Labs  12/11/14 0229 12/12/14 0635 12/12/14 1355 12/13/14 0045 12/13/14 0502 12/13/14 1037  HGB 10.6* 10.4*  --   --  10.3*  --   HCT 32.6* 32.2*  --   --  31.5*  --   PLT 154 143*  --   --  141*  --   LABPROT 17.0* 16.7*  --   --   --  18.1*  INR 1.37 1.34  --   --   --  1.49  HEPARINUNFRC <0.10* 0.90* 0.90* 0.54  --  0.55    Estimated Creatinine Clearance: 57.5 mL/min (by C-G formula based on Cr of 0.98).   Medical History: Past Medical History  Diagnosis Date  . PE (pulmonary embolism)     unprovoked PE completed 6 months of warfarin, warfarin d/ced 02/12/2010, repeat PE 07/10/2010 after a long car ride and now on lifelong coumadin.  [name] Hypertension   . Pituitary microadenoma     incidental finding CT 12/2009  . Prostate cancer   . Depression   . Hyperlipidemia   . Coronary artery disease, non-occlusive     with history of MI; Cath 2008 w multivessel nonobstructive CAD  . Dementia    Assessment: 81 year old male presents to [facility] with chest pain and symptomatic bradycardia leading to syncope. Patient has history of recurrent PE on chronic coumadin. INR was therapeutic 2.2 on admit and patient was taken to cath on 3/22 (5mg  IV Vit k total given 3/21). Heparin and warfarin bridge restarted post cath.  INR today is 1.49 (trend up) and heparin level is confirmed at goal (HL= 0.55). Patient is awaiting pacemaker placement  once INR is at goal.  -Home coumadin dose: 5mg  MTTFSa, 7.5mg  SuWe  Goal of Therapy:  Heparin level goal 0.3-0.7 INR 2-3 Monitor platelets by anticoagulation protocol: Yes   Plan:  -No heparin dose changes needed -Coumadin 7.5mg  po today  -Daily PT/INR -Daily heparin level and CBC  Hildred Laser, Pharm D 12/13/2014 11:45 AM

## 2014-12-14 ENCOUNTER — Other Ambulatory Visit: Payer: Self-pay | Admitting: Neurology

## 2014-12-14 DIAGNOSIS — R52 Pain, unspecified: Secondary | ICD-10-CM

## 2014-12-14 LAB — CBC
HCT: 28 % — ABNORMAL LOW (ref 39.0–52.0)
Hemoglobin: 9.2 g/dL — ABNORMAL LOW (ref 13.0–17.0)
MCH: 30.7 pg (ref 26.0–34.0)
MCHC: 32.9 g/dL (ref 30.0–36.0)
MCV: 93.3 fL (ref 78.0–100.0)
PLATELETS: 139 10*3/uL — AB (ref 150–400)
RBC: 3 MIL/uL — AB (ref 4.22–5.81)
RDW: 15.2 % (ref 11.5–15.5)
WBC: 3.8 10*3/uL — AB (ref 4.0–10.5)

## 2014-12-14 LAB — PROTIME-INR
INR: 1.7 — ABNORMAL HIGH (ref 0.00–1.49)
Prothrombin Time: 20.2 seconds — ABNORMAL HIGH (ref 11.6–15.2)

## 2014-12-14 LAB — HEPARIN LEVEL (UNFRACTIONATED): Heparin Unfractionated: 0.13 IU/mL — ABNORMAL LOW (ref 0.30–0.70)

## 2014-12-14 MED ORDER — WARFARIN SODIUM 5 MG PO TABS
5.0000 mg | ORAL_TABLET | Freq: Once | ORAL | Status: AC
  Administered 2014-12-14: 5 mg via ORAL
  Filled 2014-12-14: qty 1

## 2014-12-14 NOTE — Progress Notes (Signed)
VASCULAR LAB PRELIMINARY  PRELIMINARY  PRELIMINARY  PRELIMINARY  Right groin ultrasound completed.    Preliminary report:  There is no pseudoaneurysm or AV fistula noted in the right groin.  There is a small hematoma noted at cath site. Mady Oubre, RVT 12/14/2014, 5:57 PM

## 2014-12-14 NOTE — Progress Notes (Addendum)
[place] for warfarin Indication: recurrent PE  Allergies  Allergen Reactions  . Aricept [Donepezil Hcl] Other (See Comments)    Symptomatic Bradycardia.  . Other Other (See Comments)    Shrimp gives him gout    Patient Measurements: Height: 5\' 10"  (177.8 cm) Weight: 154 lb 3.4 oz (69.95 kg) IBW/kg (Calculated) : 73 Heparin Dosing Weight: 70kg  Vital Signs: Temp: 97.9 F (36.6 C) (03/26 0823) Temp Source: Oral (03/26 0823) BP: 126/78 mmHg (03/26 0823) Pulse Rate: 72 (03/26 0823)  Labs:  Recent Labs  12/12/14 0635  12/13/14 0045 12/13/14 0502 12/13/14 1037 12/14/14 0301  HGB 10.4*  --   --  10.3*  --  9.2*  HCT 32.2*  --   --  31.5*  --  28.0*  PLT 143*  --   --  141*  --  139*  LABPROT 16.7*  --   --   --  18.1* 20.2*  INR 1.34  --   --   --  1.49 1.70*  HEPARINUNFRC 0.90*  < > 0.54  --  0.55 0.13*  < > = values in this interval not displayed.  Estimated Creatinine Clearance: 58.5 mL/min (by C-G formula based on Cr of 0.98).   Medical History: Past Medical History  Diagnosis Date  . PE (pulmonary embolism)     unprovoked PE completed 6 months of warfarin, warfarin d/ced 02/12/2010, repeat PE 07/10/2010 after a long car ride and now on lifelong coumadin.  [name] Hypertension   . Pituitary microadenoma     incidental finding CT 12/2009  . Prostate cancer   . Depression   . Hyperlipidemia   . Coronary artery disease, non-occlusive     with history of MI; Cath 2008 w multivessel nonobstructive CAD  . Dementia    Assessment: 81 year old male presents to [facility] on 12/07/2014 with chest pain and symptomatic bradycardia leading to syncope. Patient has history of recurrent PE on chronic coumadin. INR was therapeutic 2.2 on admit and patient was taken to cath on 3/22 (5mg  IV Vit k total given 3/21). Heparin and warfarin bridge restarted post cath. Patient is now noted with right groin hematoma and heparin is currently being held. Hgb has  trended down from 10.3>9.2 today. INR today has trended up but remains SUBtherapeutic after 3 days of increased doses to 1.70 (likely have not seen full effect of these doses yet).  Home coumadin dose: 5mg  MTTFSa, 7.5mg  SuWe  Goal of Therapy:  INR 2-3 Monitor platelets by anticoagulation protocol: Yes   Plan:  - Hold heparin gtt with hematoma - Coumadin 5 mg po today in line with home dose - Daily PT/INR, CBC - Continue to monitor for worsening s/s bleeding  Sakib Noguez K. Velva Harman, PharmD, [place] Clinical Pharmacist - Resident Pager: [phone] Pharmacy: [phone] 12/14/2014 1:10 PM

## 2014-12-14 NOTE — Progress Notes (Signed)
Upon assessment of patient, this RN found patient to have Hematoma in right groin. MD notified and Heparin gtt stopped per MD order. Will continue to monitor.

## 2014-12-14 NOTE — Progress Notes (Signed)
ELECTROPHYSIOLOGY ROUNDING NOTE    SUBJECTIVE: The patient is doing well today.  At this time, he denies chest pain, shortness of breath, or any new concerns. Newly identified hematoma R groin  CURRENT MEDICATIONS: . amLODipine  2.5 mg Oral Daily  . aspirin EC  81 mg Oral Daily  . atorvastatin  40 mg Oral q1800  . carbidopa-levodopa  1 tablet Oral BID  . isosorbide mononitrate  30 mg Oral Daily  . memantine  28 mg Oral Q24H  . pantoprazole  40 mg Oral Daily  . Warfarin - Pharmacist Dosing Inpatient   Does not apply q1800   . sodium chloride Stopped (12/10/14 0800)  . heparin Stopped (12/14/14 0015)    OBJECTIVE: Telemetry reveals sinus rhythm   Physical Exam: Filed Vitals:   12/13/14 2049 12/14/14 0046 12/14/14 0457 12/14/14 0823  BP: 117/7 105/59 158/65 126/78  Pulse: 85 82 76 72  Temp: 98.6 F (37 C) 97.7 F (36.5 C) 98.1 F (36.7 C) 97.9 F (36.6 C)  TempSrc: Oral Oral Oral Oral  Resp: 18 16 16 18   Height:      Weight:   154 lb 3.4 oz (69.95 kg)   SpO2: 96% 97% 97% 98%    Intake/Output Summary (Last 24 hours) at 12/14/14 1147 Last data filed at 12/14/14 8127  Gross per 24 hour  Intake 396.75 ml  Output    550 ml  Net -153.25 ml    GEN- The patient is awake and alert but not oriented HEENT: normocephalic, atraumatic; sclera clear, conjunctiva pink; hearing intact; oropharynx clear; neck supple Lungs- Clear to ausculation bilaterally, normal work of breathing.  No wheezes, rales, rhonchi Heart- Regular rate and rhythm, no murmurs, rubs or gallops, PMI not laterally displaced GI- soft, non-tender, non-distended, bowel sounds present Extremities- no clubbing, cyanosis, or edema; DP/PT/radial pulses 2+ bilaterally MS- no significant deformity or atrophy Skin- warm and dry, no rash or lesion   LABS: CBC:  Recent Labs  12/13/14 0502 12/14/14 0301  WBC 5.0 3.8*  HGB 10.3* 9.2*  HCT 31.5* 28.0*  MCV 92.4 93.3  PLT 141* 139*    RADIOLOGY: Ct Head  Wo Contrast 12/07/2014   CLINICAL DATA:  Unresponsive, hypertension, bradycardia. Acute chest pain.  EXAM: CT HEAD WITHOUT CONTRAST  TECHNIQUE: Contiguous axial images were obtained from the base of the skull through the vertex without contrast.  COMPARISON:  03/14/2013  FINDINGS: Global brain atrophy evident with chronic white matter microvascular ischemic changes throughout the cerebral white matter. No acute intracranial hemorrhage, mass lesion, definite infarction, midline shift, herniation, hydrocephalus, or extra-axial fluid collection. No focal mass effect or edema. Cisterns are patent. Cerebellar atrophy as well. Stable chronic pituitary mass compatible with a known macroadenoma, image 10. This measures 19 mm.  IMPRESSION: No acute intracranial finding by noncontrast CT.  Stable brain atrophy, chronic white matter microvascular ischemic changes, and pituitary macroadenoma.   Electronically Signed   By: Jerilynn Mages.  Shick M.D.   On: 12/07/2014 14:06   Dg Chest Port 1 View 12/07/2014   CLINICAL DATA:  Chest pain, unresponsiveness  EXAM: PORTABLE CHEST - 1 VIEW  COMPARISON:  01/31/2014  FINDINGS: Cardiac shadow is stable. The lungs are well aerated bilaterally. No focal infiltrate is seen. Monitoring devices are noted over the chest.  IMPRESSION: No acute abnormality noted.   Electronically Signed   By: Inez Catalina M.D.   On: 12/07/2014 13:37    ASSESSMENT AND PLAN:  Principal Problem:   Syncope and collapse  Active Problems:   Symptomatic bradycardia   Syncope   Chest pain with high risk of acute coronary syndrome   Hypertension   Hyperlipidemia   Recurrent pulmonary embolism   Anticoagulated on Coumadin   Myocardial bridge  1.  Symptomatic bradycardia  suspect 2 with concomitant vasomotor instability and not primary  Will continue decision with family as to loop or  pacer   2.  CAD Troponin negative this admission Catheterization demonstrated myocardial bridging that improved with IC NTG.   Calcium channel blocker felt to be needed for long term management.  3.  Recurrent PE Continue  Coumadin    4.  HTN Stable, elevated this morning, but trend has been stable No change required today        12/14/2014 11:47 AM  Family increasing leaning towards monitor, which is my bias Heparin stopped last pm because of emerging R femoral hematoma Hgb stable--will check [name]  appreciate OT PT evaluation  There will be significant homehealth needs  Discussed with family the issues of no heparin given hematoma and risk of recurrent PE-- appreciate the difficulty in decision with risks on either side  Will hold heparin as by tomorrow should have INR close to 2.0

## 2014-12-15 LAB — CBC
HCT: 28.5 % — ABNORMAL LOW (ref 39.0–52.0)
Hemoglobin: 9.2 g/dL — ABNORMAL LOW (ref 13.0–17.0)
MCH: 30.4 pg (ref 26.0–34.0)
MCHC: 32.3 g/dL (ref 30.0–36.0)
MCV: 94.1 fL (ref 78.0–100.0)
Platelets: 143 10*3/uL — ABNORMAL LOW (ref 150–400)
RBC: 3.03 MIL/uL — ABNORMAL LOW (ref 4.22–5.81)
RDW: 15.3 % (ref 11.5–15.5)
WBC: 4 10*3/uL (ref 4.0–10.5)

## 2014-12-15 LAB — PROTIME-INR
INR: 1.83 — ABNORMAL HIGH (ref 0.00–1.49)
Prothrombin Time: 21.3 seconds — ABNORMAL HIGH (ref 11.6–15.2)

## 2014-12-15 MED ORDER — WARFARIN SODIUM 10 MG PO TABS
10.0000 mg | ORAL_TABLET | Freq: Once | ORAL | Status: AC
  Administered 2014-12-15: 10 mg via ORAL
  Filled 2014-12-15: qty 1

## 2014-12-15 NOTE — Progress Notes (Signed)
ELECTROPHYSIOLOGY ROUNDING NOTE    SUBJECTIVE: The patient is doing well today.  At this time, he denies chest pain, shortness of breath, or any new concerns. Newly identified hematoma R groin  CURRENT MEDICATIONS: . amLODipine  2.5 mg Oral Daily  . aspirin EC  81 mg Oral Daily  . atorvastatin  40 mg Oral q1800  . carbidopa-levodopa  1 tablet Oral BID  . isosorbide mononitrate  30 mg Oral Daily  . memantine  28 mg Oral Q24H  . pantoprazole  40 mg Oral Daily  . warfarin  10 mg Oral ONCE-1800  . Warfarin - Pharmacist Dosing Inpatient   Does not apply q1800   . sodium chloride Stopped (12/10/14 0800)    OBJECTIVE: Telemetry reveals sinus rhythm   Physical Exam: Filed Vitals:   12/14/14 2355 12/14/14 2359 12/15/14 0400 12/15/14 0740  BP: 88/61 99/76 101/62 153/88  Pulse:   67 84  Temp:   99.1 F (37.3 C) 99.5 F (37.5 C)  TempSrc:   Oral Oral  Resp:   16 18  Height:      Weight:   155 lb 12.8 oz (70.67 kg)   SpO2:   97% 97%    Intake/Output Summary (Last 24 hours) at 12/15/14 1149 Last data filed at 12/15/14 0900  Gross per 24 hour  Intake    400 ml  Output    300 ml  Net    100 ml    GEN- The patient is awake and alert but not oriented HEENT: normocephalic, atraumatic; sclera clear, conjunctiva pink; hearing intact; oropharynx clear; neck supple Lungs- Clear to ausculation bilaterally, normal work of breathing.  No wheezes, rales, rhonchi Heart- Regular rate and rhythm, no murmurs, rubs or gallops, PMI not laterally displaced GI- soft, non-tender, non-distended, bowel sounds present Extremities- no clubbing, cyanosis, or edema; DP/PT/radial pulses 2+ bilaterally MS- no significant deformity or atrophy Skin- warm and dry, no rash or lesion   LABS: CBC:  Recent Labs  12/14/14 0301 12/15/14 0345  WBC 3.8* 4.0  HGB 9.2* 9.2*  HCT 28.0* 28.5*  MCV 93.3 94.1  PLT 139* 143*    RADIOLOGY: Ct Head Wo Contrast 12/07/2014   CLINICAL DATA:  Unresponsive,  hypertension, bradycardia. Acute chest pain.  EXAM: CT HEAD WITHOUT CONTRAST  TECHNIQUE: Contiguous axial images were obtained from the base of the skull through the vertex without contrast.  COMPARISON:  03/14/2013  FINDINGS: Global brain atrophy evident with chronic white matter microvascular ischemic changes throughout the cerebral white matter. No acute intracranial hemorrhage, mass lesion, definite infarction, midline shift, herniation, hydrocephalus, or extra-axial fluid collection. No focal mass effect or edema. Cisterns are patent. Cerebellar atrophy as well. Stable chronic pituitary mass compatible with a known macroadenoma, image 10. This measures 19 mm.  IMPRESSION: No acute intracranial finding by noncontrast CT.  Stable brain atrophy, chronic white matter microvascular ischemic changes, and pituitary macroadenoma.   Electronically Signed   By: Jerilynn Mages.  Shick M.D.   On: 12/07/2014 14:06   Dg Chest Port 1 View 12/07/2014   CLINICAL DATA:  Chest pain, unresponsiveness  EXAM: PORTABLE CHEST - 1 VIEW  COMPARISON:  01/31/2014  FINDINGS: Cardiac shadow is stable. The lungs are well aerated bilaterally. No focal infiltrate is seen. Monitoring devices are noted over the chest.  IMPRESSION: No acute abnormality noted.   Electronically Signed   By: Inez Catalina M.D.   On: 12/07/2014 13:37    ASSESSMENT AND PLAN:  Principal Problem:   Syncope  and collapse Active Problems:   Symptomatic bradycardia   Syncope   Chest pain with high risk of acute coronary syndrome   Hypertension   Hyperlipidemia   Recurrent pulmonary embolism   Anticoagulated on Coumadin   Myocardial bridge  1.  Symptomatic bradycardia  suspect 2 with concomitant vasomotor instability and not primary  Will continue decision with family as to loop or  pacer   2.  CAD Troponin negative this admission Catheterization demonstrated myocardial bridging that improved with IC NTG.  Calcium channel blocker felt to be needed for long term  management.  3.  Recurrent PE Continue  Coumadin INR subtherapeutic but with hematoma    4.  HTN Stable, elevated this morning, but trend has been stable No change required today        12/15/2014 11:49 AM  Hgb stable 40 min discussion yday withdaughter and son with decision to proceed with loop recorder andnot pacemaker  With bridging, will continue amlodipine but stop nitrates as BP is low  Case manager will assist in discharge tomorrow to home if possible or we need to look at [facility]

## 2014-12-15 NOTE — Progress Notes (Signed)
Spent 45 min yesterday afternoon discussing treatment options with family--son and daughter, reviewing the data from EMS, my thoughts as to the physiology of the event, and the relative merits and benefits of pacing vs loop recorder  They have opted to pursue latter

## 2014-12-15 NOTE — Progress Notes (Signed)
Nurse tech took patients vital signs as patient was in very deep sleep, had received 44mh Trasidone for sleep at bedtime, BP was 88/71, I went into room and had daughter (at bedside) wake patient, he followed commands and rechecked BP and got 99/76.

## 2014-12-15 NOTE — Progress Notes (Signed)
[place] for warfarin Indication: recurrent PE  Allergies  Allergen Reactions  . Aricept [Donepezil Hcl] Other (See Comments)    Symptomatic Bradycardia.  . Other Other (See Comments)    Shrimp gives him gout    Patient Measurements: Height: 5\' 10"  (177.8 cm) Weight: 155 lb 12.8 oz (70.67 kg) IBW/kg (Calculated) : 73 Heparin Dosing Weight: 70kg  Vital Signs: Temp: 99.5 F (37.5 C) (03/27 0740) Temp Source: Oral (03/27 0740) BP: 153/88 mmHg (03/27 0740) Pulse Rate: 84 (03/27 0740)  Labs:  Recent Labs  12/13/14 0045  12/13/14 0502 12/13/14 1037 12/14/14 0301 12/15/14 0345  HGB  --   < > 10.3*  --  9.2* 9.2*  HCT  --   --  31.5*  --  28.0* 28.5*  PLT  --   --  141*  --  139* 143*  LABPROT  --   --   --  18.1* 20.2* 21.3*  INR  --   --   --  1.49 1.70* 1.83*  HEPARINUNFRC 0.54  --   --  0.55 0.13*  --   < > = values in this interval not displayed.  Estimated Creatinine Clearance: 59.1 mL/min (by C-G formula based on Cr of 0.98).   Medical History: Past Medical History  Diagnosis Date  . PE (pulmonary embolism)     unprovoked PE completed 6 months of warfarin, warfarin d/ced 02/12/2010, repeat PE 07/10/2010 after a long car ride and now on lifelong coumadin.  [name] Hypertension   . Pituitary microadenoma     incidental finding CT 12/2009  . Prostate cancer   . Depression   . Hyperlipidemia   . Coronary artery disease, non-occlusive     with history of MI; Cath 2008 w multivessel nonobstructive CAD  . Dementia    Assessment: 81 year old male presents [facility]07[facility]2014 with chest pain and symptomatic bradycardia leading to syncope. Patient has history of recurrent PE on chronic coumadin. INR was therapeutic 2.2 on admit and patient was taken to cath on 3/22 (5mg  IV Vit k total given 3/21). Heparin and warfarin bridge restarted post cath. Patient noted with right groin hematoma on 3/26 and heparin is currently being held. Hgb  has trended down but is stable today at 9.2. INR continues to trend up slowly but remains SUBtherapeutic after 3 days of increased doses to 1.83.  Home coumadin dose: 5mg  MTTFSa, 7.5mg  SuWe  Goal of Therapy:  INR 2-3 Monitor platelets by anticoagulation protocol: Yes   Plan:  - Coumadin 10 mg PO x 1 tonight - Daily PT/INR, CBC - Continue to monitor for worsening s/s bleeding  Elyssia Strausser K. Velva Harman, PharmD, [place] Clinical Pharmacist - Resident Pager: [phone] Pharmacy: [phone] 12/15/2014 8:59 AM

## 2014-12-16 ENCOUNTER — Ambulatory Visit: Payer: Medicare HMO

## 2014-12-16 ENCOUNTER — Encounter (HOSPITAL_COMMUNITY): Payer: Self-pay | Admitting: Internal Medicine

## 2014-12-16 ENCOUNTER — Encounter (HOSPITAL_COMMUNITY)
Admission: EM | Disposition: A | Discharge status: Home Health | Payer: Self-pay | Source: Home / Self Care | Attending: Cardiology

## 2014-12-16 DIAGNOSIS — R55 Syncope and collapse: Secondary | ICD-10-CM

## 2014-12-16 HISTORY — PX: LOOP RECORDER IMPLANT: SHX5477

## 2014-12-16 LAB — CBC
HEMATOCRIT: 28.6 % — AB (ref 39.0–52.0)
Hemoglobin: 9.3 g/dL — ABNORMAL LOW (ref 13.0–17.0)
MCH: 30.3 pg (ref 26.0–34.0)
MCHC: 32.5 g/dL (ref 30.0–36.0)
MCV: 93.2 fL (ref 78.0–100.0)
PLATELETS: 168 10*3/uL (ref 150–400)
RBC: 3.07 MIL/uL — ABNORMAL LOW (ref 4.22–5.81)
RDW: 15.1 % (ref 11.5–15.5)
WBC: 3.6 10*3/uL — AB (ref 4.0–10.5)

## 2014-12-16 LAB — PROTIME-INR
INR: 1.83 — ABNORMAL HIGH (ref 0.00–1.49)
Prothrombin Time: 21.3 seconds — ABNORMAL HIGH (ref 11.6–15.2)

## 2014-12-16 SURGERY — LOOP RECORDER IMPLANT
Anesthesia: LOCAL

## 2014-12-16 MED ORDER — LIDOCAINE-EPINEPHRINE 1 %-1:100000 IJ SOLN
INTRAMUSCULAR | Status: AC
  Filled 2014-12-16: qty 1

## 2014-12-16 MED ORDER — WARFARIN SODIUM 10 MG PO TABS
10.0000 mg | ORAL_TABLET | Freq: Once | ORAL | Status: AC
  Administered 2014-12-16: 10 mg via ORAL
  Filled 2014-12-16: qty 1

## 2014-12-16 NOTE — Progress Notes (Signed)
[place] for warfarin Indication: recurrent PE  Allergies  Allergen Reactions  . Aricept [Donepezil Hcl] Other (See Comments)    Symptomatic Bradycardia.  . Other Other (See Comments)    Shrimp gives him gout    Patient Measurements: Height: 5\' 10"  (177.8 cm) Weight: 154 lb (69.854 kg) IBW/kg (Calculated) : 73 Heparin Dosing Weight: 70kg  Vital Signs: Temp: 99.4 F (37.4 C) (03/28 0845) Temp Source: Oral (03/28 0845) BP: 125/83 mmHg (03/28 0845) Pulse Rate: 69 (03/28 0845)  Labs:  Recent Labs  12/13/14 1037  12/14/14 0301 12/15/14 0345 12/16/14 0420  HGB  --   < > 9.2* 9.2* 9.3*  HCT  --   --  28.0* 28.5* 28.6*  PLT  --   --  139* 143* 168  LABPROT 18.1*  --  20.2* 21.3* 21.3*  INR 1.49  --  1.70* 1.83* 1.83*  HEPARINUNFRC 0.55  --  0.13*  --   --   < > = values in this interval not displayed.  Estimated Creatinine Clearance: 58.4 mL/min (by C-G formula based on Cr of 0.98).   Medical History: Past Medical History  Diagnosis Date  . PE (pulmonary embolism)     unprovoked PE completed 6 months of warfarin, warfarin d/ced 02/12/2010, repeat PE 07/10/2010 after a long car ride and now on lifelong coumadin.  [name] Hypertension   . Pituitary microadenoma     incidental finding CT 12/2009  . Prostate cancer   . Depression   . Hyperlipidemia   . Coronary artery disease, non-occlusive     with history of MI; Cath 2008 w multivessel nonobstructive CAD  . Dementia    Assessment: 81 year old male presents to [facility] on 12/07/2014 with chest pain and symptomatic bradycardia leading to syncope. Patient has history of recurrent PE on chronic coumadin. INR was therapeutic 2.2 on admit and patient was taken to cath on 3/22 (5mg  IV Vit k total given 3/21). Heparin and warfarin bridge restarted post cath. Patient noted with right groin hematoma on 3/26 and heparin is currently being held. Hgb has trended down but is stable today at 9.3. INR  continues to trend up slowly but remains SUBtherapeutic after 4days of increased doses to 1.83.  Home coumadin dose: 5mg  MTTFSa, 7.5mg  SuWe  Goal of Therapy:  INR 2-3 Monitor platelets by anticoagulation protocol: Yes   Plan:  - Coumadin 10 mg PO x 1 tonight - Daily PT/INR, CBC - Continue to monitor for worsening s/s bleeding  Thank you. Anette Guarneri, PharmD [phone]  12/16/2014 9:47 AM

## 2014-12-16 NOTE — Progress Notes (Signed)
Physical Therapy Treatment Patient Details Name: [name] [name] MRN: [id number] DOB: 08/21/1931 Today's Date: 12/16/2014    History of Present Illness Pt admit with syncope. Bradycardia and PE as well as AMS.    PT Comments    Pt admitted with above diagnosis. Pt currently with functional limitations due to balance and endurance deficits. Pt family committed to getting pt home.  Daughter feels she can handle pt and is incr services in the home.  Daughter to get gait belt from gift shop.   Pt will benefit from skilled PT to increase their independence and safety with mobility to allow discharge to the venue listed below.    Follow Up Recommendations  Home health PT;Supervision/Assistance - 24 hour     Equipment Recommendations  Rolling walker with 5" wheels;3in1 (PT) gait belt   Recommendations for Other Services       Precautions / Restrictions Precautions Precautions: Fall Restrictions Weight Bearing Restrictions: No    Mobility  Bed Mobility Overal bed mobility: Needs Assistance         Sit to supine: Mod assist   General bed mobility comments:  Pt required verbal and tactile sequencing.   Transfers Overall transfer level: Needs assistance Equipment used: Rolling walker (2 wheeled) Transfers: Sit to/from Stand Sit to Stand: Mod assist         General transfer comment: max tactile and verbal cues for initiation and completion of functional mobility  Ambulation/Gait Ambulation/Gait assistance: Mod assist;Min assist Ambulation Distance (Feet): 150 Feet Assistive device: Rolling walker (2 wheeled) Gait Pattern/deviations: Step-to pattern;Decreased stride length;Shuffle;Drifts right/left;Leaning posteriorly;Wide base of support;Trunk flexed   Gait velocity interpretation: Below normal speed for age/gender General Gait Details: Pt ambulates with poor postural stability neeeding mod assist to min assist.  Pt leans to left.  Pt also leans posteriorly.  Pt randomly  removes hands off RW needing incr steadying assist.  Constant cuing needed for pt to stay close to RW and for sequencing steps and RW.  Surprisingly, pt does occasionally demonstrate some control needing min to min guard assist with ambulation but varies greatly especially worsening when distracted.     Stairs            Wheelchair Mobility    Modified Rankin (Stroke Patients Only)       Balance Overall balance assessment: Needs assistance;History of Falls         Standing balance support: Bilateral upper extremity supported;During functional activity Standing balance-Leahy Scale: Poor Standing balance comment: Pt requires UE support for balance. Pt stood to be cleaned as daughter throught he had BM.  A little smear on cloth but not much.  On arrival back to room after walk, daughter asked PT to check again for BM and this PT wiped pt again and found no residue.                      Cognition Arousal/Alertness: Awake/alert Behavior During Therapy: Flat affect Overall Cognitive Status: Difficult to assess Area of Impairment: Safety/judgement;Awareness;Problem solving;Orientation;Attention Orientation Level: Disoriented to;Place;Time;Situation Current Attention Level: Focused Memory: Decreased short-term memory   Safety/Judgement: Decreased awareness of safety;Decreased awareness of deficits Awareness: Intellectual Problem Solving: Slow processing;Decreased initiation;Difficulty sequencing;Requires verbal cues;Requires tactile cues General Comments: Pt confused.      Exercises      General Comments        Pertinent Vitals/Pain Pain Assessment: No/denies pain  VSS    Home Living  Prior Function            PT Goals (current goals can now be found in the care plan section) Progress towards PT goals: Progressing toward goals    Frequency  Min 3X/week    PT Plan Current plan remains appropriate    Co-evaluation              End of Session Equipment Utilized During Treatment: Gait belt Activity Tolerance: Patient limited by fatigue Patient left: in bed;with call bell/phone within reach;with family/visitor present;with bed alarm set     Time: 6153-7943 PT Time Calculation (min) (ACUTE ONLY): 23 min  Charges:  $Gait Training: 8-22 mins $Self Care/Home Management: 8-22                    G CodesIrwin Brakeman F 2015/01/09, 3:51 PM [facility] Acute [facility] [phone] (pager)

## 2014-12-16 NOTE — Progress Notes (Signed)
Occupational Therapy Treatment Patient Details Name: [name] [name] MRN: [id number] DOB: 06/24/1931 Today's Date: 12/16/2014    History of present illness Pt admit with syncope. Bradycardia and PE as well as AMS.   OT comments  Pt. Agreeable to participation in skilled OT.  Dtr. Present for session.  Pt. Continues with sig. Confusion.  Unable to answer questions correctly related to his family and did not recognize or correctly identify his dtr. That was present.  Requires mod/max tactile and verbal cues for initiation and completion of functional mobility and transfers.  Pts. Dtr. Kept saying "we have to get him out of here, only 3 days and look how confused he is".  States his confusion will "clear" automatically when he gets home.  At this time pt. Requires mod a and will need 24/7 S and assist if home is still the plan.    Follow Up Recommendations  Home health OT;Supervision/Assistance - 24 hour;Other (comment)    Equipment Recommendations  3 in 1 bedside comode    Recommendations for Other Services      Precautions / Restrictions Precautions Precautions: Fall Restrictions Weight Bearing Restrictions: No       Mobility Bed Mobility Overal bed mobility: Needs Assistance Bed Mobility: Sidelying to Sit;Supine to Sit   Sidelying to sit: Mod assist Supine to sit: Mod assist        Transfers Overall transfer level: Needs assistance Equipment used: Rolling walker (2 wheeled) Transfers: Sit to/from [facility] Sit to Stand: Mod assist Stand pivot transfers: Mod assist       General transfer comment: max tactile and verbal cues for initiation and completion of functional mobility    Balance                                   ADL Overall ADL's : Needs assistance/impaired                         Toilet Transfer: Moderate assistance;BSC;RW;Stand-pivot;Cueing for sequencing   Toileting- Clothing Manipulation and Hygiene: Maximal  assistance;Sit to/from stand;Cueing for sequencing       Functional mobility during ADLs: Moderate assistance General ADL Comments: pt. limited by slow processing and inability to follow one step commands consistently.  max tactile and verbal instruction required to have safe techniques and even with guidance pt. very difficult to manevuer.  dtr. present and states he does better when he is at home and that all his confusion clears up as soon as he is home.  she did not appear to have any concerns with his limited ability to follow instructions or assist.      Vision                     Perception     Praxis      Cognition   Behavior During Therapy: Flat affect Overall Cognitive Status: Difficult to assess Area of Impairment: Safety/judgement;Awareness;Problem solving;Orientation;Attention   Current Attention Level: Focused Memory: Decreased short-term memory    Safety/Judgement: Decreased awareness of safety;Decreased awareness of deficits   Problem Solving: Slow processing;Decreased initiation;Difficulty sequencing;Requires verbal cues;Requires tactile cues      Extremity/Trunk Assessment               Exercises     Shoulder Instructions       General Comments      Pertinent Vitals/ Pain  Pain Assessment: No/denies pain  Home Living                                          Prior Functioning/Environment              Frequency Min 2X/week     Progress Toward Goals  OT Goals(current goals can now be found in the care plan section)  Progress towards OT goals: Progressing toward goals     Plan Discharge plan remains appropriate    Co-evaluation                 End of Session Equipment Utilized During Treatment: Gait belt;Rolling walker   Activity Tolerance Patient tolerated treatment well   Patient Left in chair;with call bell/phone within reach;with chair alarm set;with family/visitor present   Nurse  Communication          Time: [phone] OT Time Calculation (min): 30 min  Charges: OT General Charges $OT Visit: 1 Procedure OT Treatments $Self Care/Home Management : 23-37 mins  Janice Coffin, COTA/L 12/16/2014, 10:03 AM

## 2014-12-16 NOTE — CV Procedure (Signed)
Pre op Dx syncope with bundle branch block  Post op Dx    Procedure  Loop Recorder implantation  After routine prep and drape of the left parasternal area, a small incision was created. A Med[employer]onic LINQ Reveal Loop Recorder  Serial Number  [id number] was inserted.    SteriStrip dressing was  applied.  The patient tolerated the procedure without apparent complication.

## 2014-12-17 LAB — PROTIME-INR
INR: 2.24 — ABNORMAL HIGH (ref 0.00–1.49)
Prothrombin Time: 25 seconds — ABNORMAL HIGH (ref 11.6–15.2)

## 2014-12-17 LAB — CBC
HCT: 31.2 % — ABNORMAL LOW (ref 39.0–52.0)
HEMOGLOBIN: 10 g/dL — AB (ref 13.0–17.0)
MCH: 29.9 pg (ref 26.0–34.0)
MCHC: 32.1 g/dL (ref 30.0–36.0)
MCV: 93.4 fL (ref 78.0–100.0)
PLATELETS: 177 10*3/uL (ref 150–400)
RBC: 3.34 MIL/uL — AB (ref 4.22–5.81)
RDW: 15 % (ref 11.5–15.5)
WBC: 4.7 10*3/uL (ref 4.0–10.5)

## 2014-12-17 MED ORDER — ACETAMINOPHEN 325 MG PO TABS: 650.0000 mg | ORAL_TABLET | ORAL | Status: DC | PRN

## 2014-12-17 MED ORDER — ASPIRIN 81 MG PO TBEC: 81.0000 mg | DELAYED_RELEASE_TABLET | Freq: Every day | ORAL | Status: DC

## 2014-12-17 MED ORDER — WARFARIN SODIUM 7.5 MG PO TABS
7.5000 mg | ORAL_TABLET | Freq: Once | ORAL | Status: AC
  Administered 2014-12-17: 7.5 mg via ORAL
  Filled 2014-12-17: qty 1

## 2014-12-17 MED ORDER — CARBIDOPA-LEVODOPA 25-100 MG PO TABS: 1.0000 | ORAL_TABLET | Freq: Two times a day (BID) | ORAL | Status: DC

## 2014-12-17 MED ORDER — AMLODIPINE BESYLATE 2.5 MG PO TABS: 2.5000 mg | ORAL_TABLET | Freq: Every day | ORAL | Status: DC

## 2014-12-17 NOTE — Progress Notes (Signed)
Patient Name: [name] [name]      SUBJECTIVE:without complaint   Past Medical History  Diagnosis Date  . PE (pulmonary embolism)     unprovoked PE completed 6 months of warfarin, warfarin d/ced 02/12/2010, repeat PE 07/10/2010 after a long car ride and now on lifelong coumadin.  [name] Hypertension   . Pituitary microadenoma     incidental finding CT 12/2009  . Prostate cancer   . Depression   . Hyperlipidemia   . Coronary artery disease, non-occlusive     with history of MI; Cath 2008 w multivessel nonobstructive CAD  . Dementia     Scheduled Meds:  Scheduled Meds: . amLODipine  2.5 mg Oral Daily  . aspirin EC  81 mg Oral Daily  . atorvastatin  40 mg Oral q1800  . carbidopa-levodopa  1 tablet Oral BID  . memantine  28 mg Oral Q24H  . pantoprazole  40 mg Oral Daily  . Warfarin - Pharmacist Dosing Inpatient   Does not apply q1800   Continuous Infusions: . sodium chloride Stopped (12/10/14 0800)   acetaminophen, ondansetron (ZOFRAN) IV, traZODone    PHYSICAL EXAM Filed Vitals:   12/16/14 1738 12/16/14 2047 12/16/14 2348 12/17/14 0500  BP: 135/78 145/92 120/74 148/98  Pulse:  83 90 81  Temp: 98.8 F (37.1 C) 99.1 F (37.3 C) 98.8 F (37.1 C) 98.5 F (36.9 C)  TempSrc: Oral Oral Oral Oral  Resp: 18 18    Height:      Weight:      SpO2: 96% 99% 97% 98%    Well developed and nourished in no acute distress HENT normal Neck supple with JVP-flat Clear Regular rate and rhythm, no murmurs or gallops Abd-soft with active BS No Clubbing cyanosis edema Skin-warm and dry A    Grossly normal sensory and motor function  TELEMETRY: Reviewed telemetry pt in sinus    Intake/Output Summary (Last 24 hours) at 12/17/14 0811 Last data filed at 12/16/14 1800  Gross per 24 hour  Intake    240 ml  Output    400 ml  Net   -160 ml    LABS: Basic Metabolic Panel: No results for input(s): NA, K, CL, CO2, GLUCOSE, BUN, CREATININE, CALCIUM, MG, PHOS in the last 168  hours. Cardiac Enzymes: No results for input(s): CKTOTAL, CKMB, CKMBINDEX, TROPONINI in the last 72 hours. CBC:  Recent Labs Lab 12/11/14 0229 12/12/14 0635 12/13/14 0502 12/14/14 0301 12/15/14 0345 12/16/14 0420  WBC 3.8* 4.5 5.0 3.8* 4.0 3.6*  HGB 10.6* 10.4* 10.3* 9.2* 9.2* 9.3*  HCT 32.6* 32.2* 31.5* 28.0* 28.5* 28.6*  MCV 92.4 94.2 92.4 93.3 94.1 93.2  PLT 154 143* 141* 139* 143* 168   PROTIME:  Recent Labs  12/15/14 0345 12/16/14 0420  LABPROT 21.3* 21.3*  INR 1.83* 1.83*   Liver Function Tests: No results for input(s): AST, ALT, ALKPHOS, BILITOT, PROT, ALBUMIN in the last 72 hours. No results for input(s): LIPASE, AMYLASE in the last 72 hours. BNP: BNP (last 3 results)  Recent Labs  12/07/14 1313  BNP 90.9    ProBNP (last 3 results)  Recent Labs  01/31/14 1444  PROBNP 41.0    D-Dimer: No results for input(s): DDIMER in the last 72 hours. Hemoglobin A1C: No results for input(s): HGBA1C in the last 72 hours.    ASSESSMENT AND PLAN:  Principal Problem:   Syncope and collapse Active Problems:   Symptomatic bradycardia   Syncope   Chest pain  with high risk of acute coronary syndrome   Hypertension   Hyperlipidemia   Recurrent pulmonary embolism   Anticoagulated on Coumadin   Myocardial bridge  INR pending  S/p loop Need OT PT for home health  Signed, [name] MD  12/17/2014

## 2014-12-17 NOTE — Progress Notes (Signed)
Patient was prepared for discharge per MD order. Medical tech went to assist patient with getting dressed. Patient was unable to sit himself up and could not stand. RN and medical tech attempted to assist patient with sitting up/standing, assistive devices utilized, patient had great difficultly and was unable to complete tasks. Patient's son uses [facility] for transportation without a lift. Patient's home requires patient to walk to door and up stairs. Pt's family and medical staff does not feel patient will be able to make it into van/home safely. PA notified, orthostatic vital signs ordered and pending. Night shift RN/Medical tech notified. Will page when obtained. Will continue to monitor.

## 2014-12-17 NOTE — Discharge Summary (Signed)
Physician Discharge Summary       Patient ID: [name] [name] MRN: [id number] DOB/AGE: Oct 26, 1930 81 y.o.  Admit date: 12/07/2014 Discharge date: 12/18/2014 Primary Cardiologist: Dr. Meda Coffee  Discharge Diagnoses:  Principal Problem:   Syncope and collapse Active Problems:   Symptomatic bradycardia LINQ placed   Chest pain with high risk of acute coronary syndrome, negative MI, non obstructive CAD at cath.   Hypertension   Hyperlipidemia   Recurrent pulmonary embolism   Anticoagulated on Coumadin   Myocardial bridge   Discharged Condition: good  Procedures: 12/10/14 cardiac cath by Dr. Claiborne Billings 12/16/14 LINQ placed  A [employer] LINQ Reveal Loop Recorder Serial Number [id number] Helen Newberry Joy Hospital Course: 81 y.o. male with PMH of Alzheimer's, DM, HTN and DJD, recurrent pulmonary embolism on chronic anticoagulation with Coumadin, who presented after syncope. Per history taken from patient and his sons the patient experienced some chest pain yesterday, this morning he complained of chest pain, his family called EMS, when EMS arrived patient was found unresponsive on floor with initial blood pressure of 70/40 and pulse in 40s. EMS started transcutaneous pacing and epinephrine drip that was started and 2 mg per hour. The patient received 2.5 mg of midazolam IV in the EMS.  We were called to evaluate for possible symptomatic bradycardia.  Patient's underlying heart rhythm was sinus bradycardia with rate 47 -58 beats per minute, patient has frequent PVCs followed by compensatory pauses and only one nonconducted P-wave. RBBB The patient states that he has ongoing pressure-like chest pain. He was breathing on a nonrebreather and was complaining of some shortness of breath however this improved from before. His blood pressure while on epinephrine was 112/84.  Patient's sons state that the patient is very active and fairly independent.  He was admitted and the next AM his epinephrine was changed to dopamine.   Negative MI.  Plan was to proceed with cardiac cath and then pacemaker.   Cath with myocardial bridging and nonobstructive CAD.  Pt with his dementia would have aggitation at night. Pt was placed on Heparin for coumadin bridging for his PE.    BP stabilized, EP consult with Dr. [name] was obtained.     EF by cath was 50-55%, EF by echo this admission 40-45% He has underlying conduction system disease with RBBB, 1st degree AV block and prior symptomatic bradycardia in May of 2014 that improved off of Aricept. Initially pacemaker was recommended.  Dr. [name] "suspected 2 with concomitant vasomotor instability and not primary".    By the 28th Dr. [name] had long discussion with family deciding between loop recorder vs. PPM.  Loop recorder was decided upon.  This was placed 5/00/37 without complications.  By the next day he was stable and ready for discharge.  He did have very low grade fever.  Normal WBC.    He has had eval with PT/OT and will need home PT/OT/RN/aide/ and Education officer, museum.  He will follow up in coumadin clinic with internal medicine and with device clinic and Dr. [name].  We decreased his BP meds with his lower BP.  He will need CCB for his myocardial bridging, this may need to be titrated up as outpt.    Pt was to be discharged 12/17/14 but he could not stand up.  Pt refused to go home.  D/C was cancelled and today we had PT see him again.  His family wants to take him home.  He was offered SNF for 5 days to improve his strength but pt  and family wished to go home.  He is discharged with [facility] PT/OT.RN. Aide, and SW.  Along with equipment.  He was seen and found stable for discharge by Dr. Rayann Heman.  Also Kdur 40 meq was given prior to discharge for K+ 3.2.   After arrival home pt had trouble getting in and out of bed, so we ordered a home hospital bed.    Consults: cardiology  Significant Diagnostic Studies:   BMET    Component Value Date/Time   NA 140 12/18/2014 1210   NA 140 11/12/2011    K 3.2* 12/18/2014 1210   CL 107 12/18/2014 1210   CO2 24 12/18/2014 1210   GLUCOSE 93 12/18/2014 1210   BUN 15 12/18/2014 1210   BUN 17 11/12/2011   CREATININE 0.98 12/18/2014 1210   CREATININE 1.19 03/08/2013 1146   CREATININE 1.1 11/12/2011   CALCIUM 8.9 12/18/2014 1210   GFRNONAA 75* 12/18/2014 1210   GFRNONAA 58* 03/08/2013 1146   GFRAA 87* 12/18/2014 1210   GFRAA 67 03/08/2013 1146     BMET    Component Value Date/Time   NA 140 12/18/2014 1210   NA 140 11/12/2011   K 3.2* 12/18/2014 1210   CL 107 12/18/2014 1210   CO2 24 12/18/2014 1210   GLUCOSE 93 12/18/2014 1210   BUN 15 12/18/2014 1210   BUN 17 11/12/2011   CREATININE 0.98 12/18/2014 1210   CREATININE 1.19 03/08/2013 1146   CREATININE 1.1 11/12/2011   CALCIUM 8.9 12/18/2014 1210   GFRNONAA 75* 12/18/2014 1210   GFRNONAA 58* 03/08/2013 1146   GFRAA 87* 12/18/2014 1210   GFRAA 67 03/08/2013 1146  C. Diff negative INR at discharge 2.57  Troponin I neg X 2  < 0.03  Hepatic Function Latest Ref Rng 12/07/2014 01/31/2014 02/20/2013  Total Protein 6.0 - 8.3 g/dL 6.6 7.7 6.4  Albumin 3.5 - 5.2 g/dL 3.6 3.8 3.2(L)  AST 0 - 37 U/L 36 37 35  ALT 0 - 53 U/L 7 36 28  Alk Phosphatase 39 - 117 U/L 42 54 56  Total Bilirubin 0.3 - 1.2 mg/dL 0.8 0.3 0.3   CBC    Component Value Date/Time   WBC 4.6 12/18/2014 0338   RBC 3.27* 12/18/2014 0338   HGB 9.6* 12/18/2014 0338   HCT 30.6* 12/18/2014 0338   PLT 185 12/18/2014 0338   MCV 93.6 12/18/2014 0338   MCH 29.4 12/18/2014 0338   MCHC 31.4 12/18/2014 0338   RDW 14.7 12/18/2014 0338   LYMPHSABS 1.5 12/07/2014 1313   MONOABS 0.2 12/07/2014 1313   EOSABS 0.1 12/07/2014 1313   BASOSABS 0.0 12/07/2014 1313       Cardiac Cath  12/10/14: HEMODYNAMICS:  Central Aorta: 124/75  Left Ventricle: 124/8  ANGIOGRAPHY:  Left main: Large caliber normal long vessel which bifurcated into an LAD and left circumflex vessel.   LAD: Large caliber vessel that gave rise to a  proximal diagonal vessel. There was 40-50% stenosis in the diagonal vessel after a small proximal branch. The LAD after the diagonal vessel at 50% stenosis prior to the septal perforating artery. After the second small diagonal vessel in the mid LAD there was a small area that had significant mid systolic bridging with narrowing up to 80% during systole. Following IC nitroglycerin this significantly improved. During diastole, the vessel was 20% narrowed.  Left circumflex: Small tortuous vessel which gave rise to 2 OM branches.   Right coronary artery: Large dominant normal vessel that supplied the PDA  and PLA vessel.   Left ventriculography revealed low-normal ejection fraction at 50-55%. There was ectopy without definitive wall motion abnormalities.   IMPRESSION:  Normal LV function with an ejection fraction of 50 - 55% with ectopy during ventriculography.  Single vessel coronary artery disease involving the LAD with 40-50% stenosis in the first diagonal branch, 50% focal stenosis in the LAD after the takeoff of this diagonal vessel, and mid LAD, mid systolic bridging with narrowing up to 80% during systole, which improved to 20% during diastole and significantly improved following IC nitroglycerin administration.   ECHO: 12/09/14: Study Conclusions  - Left ventricle: The cavity size was normal. Wall thickness was normal. Systolic function was mildly to moderately reduced. The estimated ejection fraction was in the range of 40% to 45%. Regional wall motion abnormalities cannot be excluded. Doppler parameters are consistent with abnormal left ventricular relaxation (grade 1 diastolic dysfunction).  PORTABLE CHEST - 1 VIEW COMPARISON: Portable chest x-ray of December 07, 2014 FINDINGS: The lungs are mildly hypoinflated. There is no focal infiltrate. There is minimal subsegmental atelectasis at both bases. There is no pleural effusion. The heart and pulmonary vascularity  are normal. The observed portions of the bony thorax are unremarkable. IMPRESSION: Minimal subsegmental atelectasis at both bases. Otherwise the examination is within the limits of normal.  CT Head: FINDINGS: Global brain atrophy evident with chronic white matter microvascular ischemic changes throughout the cerebral white matter. No acute intracranial hemorrhage, mass lesion, definite infarction, midline shift, herniation, hydrocephalus, or extra-axial fluid collection. No focal mass effect or edema. Cisterns are patent. Cerebellar atrophy as well. Stable chronic pituitary mass compatible with a known macroadenoma, image 10. This measures 19 mm.  IMPRESSION: No acute intracranial finding by noncontrast CT.  Stable brain atrophy, chronic white matter microvascular ischemic changes, and pituitary macroadenoma.   Discharge Exam: Blood pressure 120/78, pulse 70, temperature 98.2 F (36.8 C), temperature source Oral, resp. rate 15, height _0  (1.778 m), weight 153 lb 10.6 oz (69.7 kg), SpO2 98 %.   Disposition: Home      Discharge Instructions    AMB Referral to [facility] Management    Complete by:  As directed   Reason for consult:  Support needed for medical management; assist daughter with resources and management at home; Cardiac disease  Expected date of contact:  1-3 days (reserved for hospital discharges)     DME Bedside commode    Complete by:  As directed   3 in 1     Face-to-face encounter (required for Medicare/Medicaid patients)    Complete by:  As directed   I WUJWJX,BJYNW R certify that this patient is under my care and that I, or a nurse practitioner or physician's assistant working with me, had a face-to-face encounter that meets the physician face-to-face encounter requirements with this patient on 12/17/2014. The encounter with the patient was in whole, or in part for the following medical condition(s) which is the primary reason for home health care (List  medical condition): syncope, weakness, dementia, needs help to ADLs, ambulate.  The encounter with the patient was in whole, or in part, for the following medical condition, which is the primary reason for home health care:  syncope  I certify that, based on my findings, the following services are medically necessary home health services:   Nursing Physical therapy    Reason for Medically Necessary Home Health Services:   Skilled Nursing- Change/Decline in Patient Status Therapy- Home Adaptation to Facilitate Safety Therapy- Gait  Training, Public librarian    My clinical findings support the need for the above services:  Unable to leave home safely without assistance and/or assistive device  Further, I certify that my clinical findings support that this patient is homebound due to:  Mental confusion     For home use only DME 4 wheeled rolling walker with seat    Complete by:  As directed      Home Health    Complete by:  As directed   To provide the following care/treatments:   PT OT Social work Therapist, sports Home Health Aide    [facility]            Medication List    STOP taking these medications        lisinopril 40 MG tablet  Commonly known as:  PRINIVIL,ZESTRIL     nortriptyline 10 MG capsule  Commonly known as:  PAMELOR      TAKE these medications        acetaminophen 325 MG tablet  Commonly known as:  TYLENOL  Take 2 tablets (650 mg total) by mouth every 4 (four) hours as needed for headache or mild pain.     amLODipine 2.5 MG tablet  Commonly known as:  NORVASC  Take 1 tablet (2.5 mg total) by mouth daily.     aspirin 81 MG EC tablet  Take 1 tablet (81 mg total) by mouth daily.     atorvastatin 40 MG tablet  Commonly known as:  LIPITOR  TAKE 1 TABLET (40 MG TOTAL) BY MOUTH EVERY EVENING.     b complex vitamins tablet  Take 1 tablet by mouth daily.     calcium-vitamin D 500-200 MG-UNIT per tablet  Take 1 tablet by mouth daily.      carbidopa-levodopa 25-100 MG per tablet  Commonly known as:  SINEMET IR  Take 1 tablet by mouth 2 (two) times daily.     fish oil-omega-3 fatty acids 1000 MG capsule  Take 2 g by mouth daily.     multivitamin with minerals tablet  Take 1 tablet by mouth daily.     NAMENDA XR 28 MG Cp24 24 hr capsule  Generic drug:  memantine  Take 28 mg by mouth daily.     omeprazole 20 MG capsule  Commonly known as:  PRILOSEC  Take 1 capsule (20 mg total) by mouth daily.     warfarin 5 MG tablet  Commonly known as:  COUMADIN  Take 1 tablet (25m) by mouth on Monday, Tuesday, Thursday, Friday and Saturday.  Take 1.5 tablets (7.520m by mouth on Sunday and Wednesday.       Follow-up Information    [facility] up with [facility]  Why:  rolling walker and 3n1   Contact information:   [address] [place] [phone]     [facility]   Specialty:  Cardiology   Why:  at 2:00 pm   Contact information:   [phone]    Follow up with [facility] On 01/23/2015.   Specialty:  Cardiology   Why:  at 11:45 AM   Contact information:   [id number] [place] [phone]     Follow up with [facility] On 12/23/2014.   Specialty:  Internal Medicine  Why:  at 9:00 am for coumadin check.    Contact information:   [place] [address] [phone]       Follow up with Dorothy Spark, MD.   Specialty:  Cardiology   Why:  the office will call with date and time   Contact information:   [place] [address] [phone]       Follow up with [facility].   Specialty:  Home Health Services   Why:  They will provide your home health care services at your home   Contact information:   [address] [place] [address] [phone]        Discharge Instructions: You have a LINQ event monitor to record your heart beats.  Send in a transmission if any passing out or dizziness. The white strip should fall off but you may remove in 3 days.  No showers for 3 days.  Call if any swelling at site or drainage.   We lowered your BP meds.  Home Health will call us if your BP becomes elevated.  Heart Healthy diet.  Follow up as instructed.  Labs 12/26/14  Signed: KFEXMD,YJWLK R Nurse Practitioner-Certified [facility]: [facility] 12/18/2014, 2:31 PM  Time spent on discharge :> 30 minutes.     Thompson Grayer MD

## 2014-12-17 NOTE — Progress Notes (Signed)
Received a call from RN regarding patient weakness. They stated that although patient had walked 150 feet with physical therapy earlier, he was not able to stand unaided, much less walk at this time.  Patient also seems a little somnolent, but vital signs are stable and he has not had any sedating medications.  For now, will cancel the discharge, he patient overnight and reassess in a.m.  Rosaria Ferries, PA-C 12/17/2014 8:14 PM Beeper [phone]

## 2014-12-17 NOTE — Progress Notes (Signed)
Subjective: No chest pain no SOB  Objective: Vital signs in last 24 hours: Temp:  [98.5 F (36.9 C)-99.8 F (37.7 C)] 99.8 F (37.7 C) (03/29 0814) Pulse Rate:  [73-90] 73 (03/29 0814) Resp:  [18] 18 (03/29 0814) BP: (120-148)/(74-98) 133/79 mmHg (03/29 0814) SpO2:  [96 %-99 %] 97 % (03/29 0814) Weight change:  Last BM Date: 12/13/14 Intake/Output from previous day: -160 03/28 0701 - 03/29 0700 In: 240 [P.O.:240] Out: 400 [Urine:400] Intake/Output this shift: Total I/O In: 150 [P.O.:150] Out: -   PE: General:Pleasant affect, NAD Skin:Warm and dry, brisk capillary refill HEENT:normocephalic, sclera clear, mucus membranes moist Heart:S1S2 RRR without murmur, gallup, rub or click Lungs:clear, ant without rales, rhonchi, or wheezes WUJ:WJXB, non tender, + BS, do not palpate liver spleen or masses Ext:no lower ext edema, 2+ pedal pulses, 2+ radial pulses Neuro:alert  MAE, follows commands, + facial symmetry TELE:  SR   Lab Results:  Recent Labs  12/16/14 0420 12/17/14 1105  WBC 3.6* 4.7  HGB 9.3* 10.0*  HCT 28.6* 31.2*  PLT 168 177   BMET No results for input(s): NA, K, CL, CO2, GLUCOSE, BUN, CREATININE, CALCIUM in the last 72 hours.  Invalid input(s): MAGNESIUM No results for input(s): TROPONINI in the last 72 hours.  Invalid input(s): CK, MB  Lab Results  Component Value Date   CHOL 207* 06/30/2012   HDL 56 06/30/2012   LDLCALC 125* 06/30/2012   TRIG 129 06/30/2012   CHOLHDL 3.7 06/30/2012   Lab Results  Component Value Date   HGBA1C 6.0 09/30/2014     Lab Results  Component Value Date   TSH 0.735 09/30/2014       Studies/Results: No results found.  Medications: I have reviewed the patient's current medications. Scheduled Meds: . amLODipine  2.5 mg Oral Daily  . aspirin EC  81 mg Oral Daily  . atorvastatin  40 mg Oral q1800  . carbidopa-levodopa  1 tablet Oral BID  . memantine  28 mg Oral Q24H  . pantoprazole  40 mg Oral  Daily  . warfarin  7.5 mg Oral ONCE-1800  . Warfarin - Pharmacist Dosing Inpatient   Does not apply q1800   Continuous Infusions: . sodium chloride Stopped (12/10/14 0800)   PRN Meds:.acetaminophen, ondansetron (ZOFRAN) IV, traZODone  Assessment/Plan: Principal Problem:   Syncope and collapse- may be due to bradycardia  Active Problems:   Symptomatic bradycardia- linq placed for further eval.    Chest pain with high risk of acute coronary syndrome- troponin neg.  Catheterization 12/10/14 demonstrated myocardial bridging that improved with IC NTG.  Calcium channel blocker felt to be needed for long term management    Hypertension elevated at 0400 otherwise controlled    Hyperlipidemia on lipitor   Recurrent pulmonary embolism- on coumadin   Anticoagulated on Coumadin- INR 2.24    Cath site hematoma no pseudo    Dementia      D/c today to home with PT/OT vs. SNF    LOS: 10 days   Time spent with pt. :15 minutes. [facility] R  Nurse Practitioner Certified Pager [phone] or after 5pm and on weekends call [phone] 12/17/2014, 12:00 PM   I have seen, examined the patient, and reviewed the above assessment and plan.  On exam, pt is comfortable and alert. Changes to above are made where necessary.   No arrhythmias overnight.  Given ekg, I am suspicious of AV block as the cause of syncope.  He  has been evaluated by Dr [name] and has LINQ implanted.  Will need wound check in 10 days.  Follow-up with Dr [name] in 4 weeks. No driving until released by Dr [name]. DC today.  Co Sign: Thompson Grayer, MD 12/17/2014 3:15 PM

## 2014-12-17 NOTE — Progress Notes (Signed)
[place] for Warfarin Indication: recurrent PE  Allergies  Allergen Reactions  . Aricept [Donepezil Hcl] Other (See Comments)    Symptomatic Bradycardia.  . Other Other (See Comments)    Shrimp gives him gout    Patient Measurements: Height: 5\' 10"  (177.8 cm) Weight: 154 lb (69.854 kg) IBW/kg (Calculated) : 73 Heparin Dosing Weight: 70kg  Vital Signs: Temp: 99.8 F (37.7 C) (03/29 0814) Temp Source: Oral (03/29 0814) BP: 133/79 mmHg (03/29 0814) Pulse Rate: 73 (03/29 0814)  Labs:  Recent Labs  12/15/14 0345 12/16/14 0420  HGB 9.2* 9.3*  HCT 28.5* 28.6*  PLT 143* 168  LABPROT 21.3* 21.3*  INR 1.83* 1.83*    Estimated Creatinine Clearance: 58.4 mL/min (by C-G formula based on Cr of 0.98).   Medical History: Past Medical History  Diagnosis Date  . PE (pulmonary embolism)     unprovoked PE completed 6 months of warfarin, warfarin d/ced 02/12/2010, repeat PE 07/10/2010 after a long car ride and now on lifelong coumadin.  [name] Hypertension   . Pituitary microadenoma     incidental finding CT 12/2009  . Prostate cancer   . Depression   . Hyperlipidemia   . Coronary artery disease, non-occlusive     with history of MI; Cath 2008 w multivessel nonobstructive CAD  . Dementia    Assessment: 81 year old male presents to [facility]07[facility]2014 with chest pain and symptomatic bradycardia leading to syncope. Patient has history of recurrent PE on chronic coumadin. INR was therapeutic 2.2 on admit and patient was taken to cath on 3/22 (5mg  IV Vit k total given 3/21). Heparin and warfarin bridge restarted post cath. Patient noted with right groin hematoma on 3/26 and heparin is currently being held. Hgb has trended down but is stable today at 9.3.   Refused INR this AM  Home coumadin dose: 5mg  MTTFSa, 7.5mg  SuWe  Goal of Therapy:  INR 2-3 Monitor platelets by anticoagulation protocol: Yes   Plan:  Coumadin 7.5 mg po x 1 dose  tonight Increase home dose to Coumadin 5 mg M, Tue, Thur, Saturday, 7.5 mg Wed, Friday, Sunday Follow up AM INR if not dced today  Thank you. Anette Guarneri, PharmD [phone]  12/17/2014 9:38 AM

## 2014-12-17 NOTE — Progress Notes (Signed)
Pt refused AM labs.Pt is now settled and calm. Suggest that we retry when family arrives as he is calmer when they are around. Will continue to monitor.   [name] [name] E

## 2014-12-17 NOTE — Progress Notes (Signed)
On Call Cardiology Note  Subjective: On-call cardiology team was alerted by the patient's nurse that [name] [name] appeared weak and unfit for discharge home today.  I evaluated the patient, who states that he does not feel like himself due to multiple worries associated with his hospitalization.  His family states that he does not seem any different at this time compared to his baseline during this hospitalization.  He denies focal weakness and pain.  Objective: Temp:  [98.5 F (36.9 C)-99.8 F (37.7 C)] 98.7 F (37.1 C) (03/29 2015) Pulse Rate:  [73-90] 73 (03/29 0814) Resp:  [18] 18 (03/29 2015) BP: (120-148)/(74-98) 133/79 mmHg (03/29 0814) SpO2:  [97 %-98 %] 98 % (03/29 2015)  Gen.: Frail, elderly man lying comfortably in bed.  Multiple family members are at the bedside. HEENT: PERRL, EOMI, OP clear. Neck: No JVD. Respiratory: Faint bibasilar crackles.  Otherwise, clear lungs with fair air movement. Cardiovascular: Bradycardic but regular without murmurs. Extremities: No lower extremity edema. Neuro: Cranial nerves [name]-XII intact.  4+/5 upper and lower extremity strength bilaterally.  Patient is A and O to person and place.  Assessment/plan: 81 year old man admitted with syncope status post coronary angiography and implantable loop recorder placement.  His discharge today was complicated by notable weakness, likely due to deconditioning from his prolonged hospitalization.  He does not have any focal neurologic deficits and is at his baseline, per multiple family members and the patient's nurse.  No further interventions to be done at this time.  We will plan for reevaluation by PT/OT in the morning to help with safe discharge planning.  Nelva Bush, MD Cardiology Fellow

## 2014-12-18 ENCOUNTER — Other Ambulatory Visit: Payer: Self-pay | Admitting: Cardiology

## 2014-12-18 DIAGNOSIS — I10 Essential (primary) hypertension: Secondary | ICD-10-CM | POA: Insufficient documentation

## 2014-12-18 DIAGNOSIS — E876 Hypokalemia: Secondary | ICD-10-CM

## 2014-12-18 DIAGNOSIS — R55 Syncope and collapse: Secondary | ICD-10-CM | POA: Insufficient documentation

## 2014-12-18 LAB — PROTIME-INR
INR: 2.57 — ABNORMAL HIGH (ref 0.00–1.49)
Prothrombin Time: 27.8 seconds — ABNORMAL HIGH (ref 11.6–15.2)

## 2014-12-18 LAB — BASIC METABOLIC PANEL
ANION GAP: 9 (ref 5–15)
BUN: 15 mg/dL (ref 6–23)
CHLORIDE: 107 mmol/L (ref 96–112)
CO2: 24 mmol/L (ref 19–32)
Calcium: 8.9 mg/dL (ref 8.4–10.5)
Creatinine, Ser: 0.98 mg/dL (ref 0.50–1.35)
GFR calc non Af Amer: 75 mL/min — ABNORMAL LOW (ref >90)
GFR, EST AFRICAN AMERICAN: 87 mL/min — AB (ref >90)
GLUCOSE: 93 mg/dL (ref 70–99)
Potassium: 3.2 mmol/L — ABNORMAL LOW (ref 3.5–5.1)
SODIUM: 140 mmol/L (ref 135–145)

## 2014-12-18 LAB — CBC
HCT: 30.6 % — ABNORMAL LOW (ref 39.0–52.0)
Hemoglobin: 9.6 g/dL — ABNORMAL LOW (ref 13.0–17.0)
MCH: 29.4 pg (ref 26.0–34.0)
MCHC: 31.4 g/dL (ref 30.0–36.0)
MCV: 93.6 fL (ref 78.0–100.0)
PLATELETS: 185 10*3/uL (ref 150–400)
RBC: 3.27 MIL/uL — ABNORMAL LOW (ref 4.22–5.81)
RDW: 14.7 % (ref 11.5–15.5)
WBC: 4.6 10*3/uL (ref 4.0–10.5)

## 2014-12-18 LAB — TSH: TSH: 0.504 u[IU]/mL (ref 0.350–4.500)

## 2014-12-18 MED ORDER — POTASSIUM CHLORIDE CRYS ER 20 MEQ PO TBCR: 40.0000 meq | EXTENDED_RELEASE_TABLET | Freq: Once | ORAL | Status: DC

## 2014-12-18 NOTE — Progress Notes (Signed)
Talked to patient with daughter [name] present about DCP; pt does not want SNF placement and daughter is in agreement for DCP to be home at discharge; [name] does not want [facility] and request [name]; Bethena Roys with [facility] called and is agreeable to accept the patient for [facility] services; [facility] called for DME - to be delivered today; Aneta Mins [phone]

## 2014-12-18 NOTE — Progress Notes (Signed)
         Subjective: Pt not discharged due to weakness, though family states this is his baseline  Objective: Vital signs in last 24 hours: Temp:  [98.3 F (36.8 C)-99.8 F (37.7 C)] 98.4 F (36.9 C) (03/30 0732) Pulse Rate:  [70-73] 70 (03/30 0732) Resp:  [15-18] 15 (03/30 0732) BP: (107-137)/(64-79) 115/64 mmHg (03/30 0732) SpO2:  [97 %-98 %] 98 % (03/30 0732) Weight:  [153 lb 10.6 oz (69.7 kg)] 153 lb 10.6 oz (69.7 kg) (03/30 0400) Weight change:  Last BM Date: 12/13/14 Intake/Output from previous day: -110 03/29 0701 - 03/30 0700 In: 57 [P.O.:490] Out: 600 [Urine:600] Intake/Output this shift:    PE: General:Pleasant affect, NAD Skin:Warm and dry, brisk capillary refill HEENT:normocephalic, sclera clear, mucus membranes moist Heart:S1S2 RRR without murmur, gallup, rub or click Lungs:clear without rales, rhonchi, or wheezes Chest wall: LINQ site without swelling, small area of old blood.  GEZ:MOQH, non tender, + BS, do not palpate liver spleen or masses Ext:no lower ext edema, 2+ pedal pulses, 2+ radial pulses, does have difficulty with movement just leaning forward Neuro:alert and oriented, MAE, follows commands, + facial symmetry Tele: SR SB to 49, Coupled PVCs.    Lab Results:  Recent Labs  12/17/14 1105 12/18/14 0338  WBC 4.7 4.6  HGB 10.0* 9.6*  HCT 31.2* 30.6*  PLT 177 185    Lab Results  Component Value Date   CHOL 207* 06/30/2012   HDL 56 06/30/2012   LDLCALC 125* 06/30/2012   TRIG 129 06/30/2012   CHOLHDL 3.7 06/30/2012   Lab Results  Component Value Date   HGBA1C 6.0 09/30/2014     Lab Results  Component Value Date   TSH 0.735 09/30/2014      Studies/Results: No results found.  Medications: I have reviewed the patient's current medications. Scheduled Meds: . amLODipine  2.5 mg Oral Daily  . aspirin EC  81 mg Oral Daily  . atorvastatin  40 mg Oral q1800  . carbidopa-levodopa  1 tablet Oral BID  . memantine  28 mg Oral Q24H    . pantoprazole  40 mg Oral Daily  . Warfarin - Pharmacist Dosing Inpatient   Does not apply q1800   Continuous Infusions: . sodium chloride Stopped (12/10/14 0800)   PRN Meds:.acetaminophen, ondansetron (ZOFRAN) IV, traZODone  Assessment/Plan: Principal Problem:   Syncope and collapse, was hypotensive, meds have been decreased BP mostly 107/ 65 to 115/64 at other times 137/74  Active Problems:   Symptomatic bradycardia LINQ placed   Chest pain with high risk of acute coronary syndrome, negative MI, non obstructive CAD at cath.   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 : 15  minutes. [facility]  Nurse Practitioner Certified Pager [phone] or after 5pm and on weekends call [phone] 12/18/2014, 8:13 AM   Thompson Grayer MD

## 2014-12-18 NOTE — Discharge Instructions (Signed)
You have a LINQ event monitor to record your heart beats.  Send in a transmission if any passing out or dizziness. The white strip should fall off but you may remove in 3 days.  No showers for 3 days.  Call if any swelling at site or drainage.   We lowered your BP meds.  Home Health will call us if your BP becomes elevated.  Heart Healthy diet.  Follow up as instructed.  Have lab work done on 12/26/14 in Dr. Olin Pia office.

## 2014-12-18 NOTE — Consult Note (Signed)
   [facility] Inpatient Consult   12/18/2014  [name] [name] October 06, 1930 [id number] Met with the patient and daughter, Lilli Light, at bedside to offer [facility] [employer] services as benefit of [employer]. Patient and daughter agreeable to services and will receive post hospital discharge call and will be evaluated for monthly home visits.  Consent signed by daughter with patient's permission.   Of note, [facility] Care Management services does not replace or interfere with any services that are arranged by inpatient case management or social work. For additional questions or referrals please contact: Natividad Brood, RN BSN [facility] Liaison  [phone] business mobile phone

## 2014-12-18 NOTE — Progress Notes (Signed)
Physical Therapy Treatment Patient Details Name: [name] [name] MRN: [id number] DOB: 1931-04-20 Today's Date: 12/18/2014    History of Present Illness Pt admit with syncope. Bradycardia and PE as well as AMS.    PT Comments    Pt admitted with above diagnosis. Pt currently with functional limitations due to balance and endurance deficits. Pt daughter committed to taking pt home and feels he will do best in home environment.  Explained options to daughter that NHP would provide 5 day a week therapy and HH would be min 2-3 x week therapy and daughter still wants to try to take pt home and hire extra caregivers.  Recommend HHPT, [place], HHAide, HHRN as well as 24 hour care.  Daughter agrees.  She says RW has been delivered to home and that she bought a gait belt.   Pt will benefit from skilled PT to increase their independence and safety with mobility to allow discharge to the venue listed below.    Follow Up Recommendations  Home health PT;Supervision/Assistance - 24 hour (HHOT, HHAide, HHRN)     Equipment Recommendations  Rolling walker with 5" wheels;3in1 (PT) (gait belt)    Recommendations for Other Services       Precautions / Restrictions Precautions Precautions: Fall Restrictions Weight Bearing Restrictions: No    Mobility  Bed Mobility Overal bed mobility: Needs Assistance Bed Mobility: Sidelying to Sit;Supine to Sit   Sidelying to sit: Mod assist Supine to sit: Mod assist     General bed mobility comments:  Pt required verbal and tactile sequencing.   Transfers Overall transfer level: Needs assistance Equipment used: Rolling walker (2 wheeled) Transfers: Sit to/from Stand Sit to Stand: Mod assist;Max assist         General transfer comment: Pt needed assist for power up.  Pt had difficulty achieving upright posture.  Pt with flexed posture unless constantly cued and even then was still somewhat flexed.  Pt with bent knees and trunk. Once pt steadied and cued, pt  was able to ambulate with constant mod assiist and cues.  AT timeseven close to min assist but then would lean posteriorly and need more assist.  Daughter feels she can handle pt at home and that he will do better in familiar surroundings.  Daughter bought a gait belt and she also is calling to hire a Actuary.    Ambulation/Gait Ambulation/Gait assistance: Mod assist;Min assist Ambulation Distance (Feet): 280 Feet (200 feet and then 80 feet) Assistive device: Rolling walker (2 wheeled) Gait Pattern/deviations: Step-to pattern;Decreased stride length;Shuffle;Drifts right/left;Trunk flexed;Leaning posteriorly;Wide base of support   Gait velocity interpretation: Below normal speed for age/gender General Gait Details: Pt ambulates with poor postural stability neeeding mod assist to min assist.  Pt leans to left.  Pt also leans posteriorly.  Pt randomly removes hands off RW needing incr steadying assist.  Constant cuing needed for pt to stay close to RW and for sequencing steps and RW.  Surprisingly, pt does occasionally demonstrate some control needing min to min guard assist with ambulation but varies greatly especially worsening when distracted.     Stairs            Wheelchair Mobility    Modified Rankin (Stroke Patients Only)       Balance Overall balance assessment: Needs assistance         Standing balance support: Bilateral upper extremity supported;During functional activity Standing balance-Leahy Scale: Poor Standing balance comment: Pt requires heavy min to mod assist for standing with RW.  Cognition Arousal/Alertness: Awake/alert Behavior During Therapy: Flat affect Overall Cognitive Status: Difficult to assess Area of Impairment: Safety/judgement;Awareness;Problem solving;Orientation;Attention Orientation Level: Disoriented to;Place;Time;Situation Current Attention Level: Focused Memory: Decreased short-term memory   Safety/Judgement:  Decreased awareness of safety;Decreased awareness of deficits Awareness: Intellectual Problem Solving: Slow processing;Decreased initiation;Difficulty sequencing;Requires verbal cues;Requires tactile cues General Comments: Pt confused.      Exercises      General Comments General comments (skin integrity, edema, etc.): Daughter states she will decide about getting hospital bed when she gets home.       Pertinent Vitals/Pain Pain Assessment: No/denies pain  VSS    Home Living                      Prior Function            PT Goals (current goals can now be found in the care plan section) Progress towards PT goals: Progressing toward goals    Frequency  Min 3X/week    PT Plan Current plan remains appropriate    Co-evaluation             End of Session Equipment Utilized During Treatment: Gait belt Activity Tolerance: Patient limited by fatigue Patient left: in chair;with call bell/phone within reach;with family/visitor present     Time: 6195-0932 PT Time Calculation (min) (ACUTE ONLY): 46 min  Charges:  $Gait Training: 8-22 mins $Therapeutic Activity: 8-22 mins $Self Care/Home Management: 8-22                    G CodesDenice [name] 2015-01-15, 1:46 PM [name] [name],PT Acute Rehabilitation [phone] (pager)

## 2014-12-18 NOTE — Progress Notes (Signed)
Pt was sent home on ambulance and discharge instruction reviewed with his daughter. He was sent home with his belongings and new medical equipment. His IV had been removed and placed off telemetry. His flowers couldn't be transported in ambulance but family instructed to pick them up from unit later.

## 2014-12-18 NOTE — Progress Notes (Signed)
Upon getting report from day shift nursing staff patient had discharge orders, paperwork was given to his family, IV had been removed and monitor was off. Day shift nursing staff was assisting in getting patient dressed and he was too weak to hold himself up while sitting. Cardiology was paged and Orthostatic Vital signs were ordered. I received this report and then called Rosaria Ferries NP to explain that it will take two staff to assist this patient and that I did not feel he was safe and strong enough to be discharged at this time. She agreed with me and it was decided to hold off discharge until tomorrow after PT sees this patient again. This was explained to the family and the patient. Charge nurse was at bedside to assist.

## 2014-12-18 NOTE — Progress Notes (Addendum)
Subjective: Pt not discharged due to weakness, though family states this is his baseline  Objective: Vital signs in last 24 hours: Temp:  [98.3 F (36.8 C)-99 F (37.2 C)] 98.4 F (36.9 C) (03/30 0732) Pulse Rate:  [70] 70 (03/30 0732) Resp:  [15-18] 15 (03/30 0732) BP: (107-137)/(64-74) 115/64 mmHg (03/30 0732) SpO2:  [97 %-98 %] 98 % (03/30 0732) Weight:  [153 lb 10.6 oz (69.7 kg)] 153 lb 10.6 oz (69.7 kg) (03/30 0400) Weight change:  Last BM Date: 12/18/14 Intake/Output from previous day: -110 03/29 0701 - 03/30 0700 In: 63 [P.O.:490] Out: 600 [Urine:600] Intake/Output this shift: Total I/O In: 360 [P.O.:360] Out: -   PE: General:Pleasant affect, NAD Skin:Warm and dry, brisk capillary refill HEENT:normocephalic, sclera clear, mucus membranes moist Heart:S1S2 RRR without murmur, gallup, rub or click Lungs:clear without rales, rhonchi, or wheezes Chest wall: LINQ site without swelling, small area of old blood.  GBT:DVVO, non tender, + BS, do not palpate liver spleen or masses Ext:no lower ext edema, 2+ pedal pulses, 2+ radial pulses, does have difficulty with movement just leaning forward Neuro:alert and oriented, MAE, follows commands, + facial symmetry Tele: SR SB to 49, Coupled PVCs.    Lab Results:  Recent Labs  12/17/14 1105 12/18/14 0338  WBC 4.7 4.6  HGB 10.0* 9.6*  HCT 31.2* 30.6*  PLT 177 185    Lab Results  Component Value Date   CHOL 207* 06/30/2012   HDL 56 06/30/2012   LDLCALC 125* 06/30/2012   TRIG 129 06/30/2012   CHOLHDL 3.7 06/30/2012   Lab Results  Component Value Date   HGBA1C 6.0 09/30/2014     Lab Results  Component Value Date   TSH 0.735 09/30/2014      Studies/Results: No results found.  Medications: I have reviewed the patient's current medications. Scheduled Meds: . amLODipine  2.5 mg Oral Daily  . atorvastatin  40 mg Oral q1800  . carbidopa-levodopa  1 tablet Oral BID  . memantine  28 mg Oral Q24H    . pantoprazole  40 mg Oral Daily  . Warfarin - Pharmacist Dosing Inpatient   Does not apply q1800   Continuous Infusions: . sodium chloride Stopped (12/10/14 0800)   PRN Meds:.acetaminophen, ondansetron (ZOFRAN) IV, traZODone  Assessment/Plan: Principal Problem:   Syncope and collapse, was hypotensive, meds have been decreased BP mostly 107/ 65 to 115/64 at other times 137/74  Active Problems:   Symptomatic bradycardia LINQ placed   Chest pain with high risk of acute coronary syndrome, negative MI, non obstructive CAD at cath.   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 : 15  minutes. Thompson Grayer  Nurse Practitioner Certified Pager [phone] or after 5pm and on weekends call [phone] 12/18/2014, 9:46 AM   I have seen, examined the patient, and reviewed the above assessment and plan.  Changes to above are made where necessary.  The patient is weak.  Will ask PT to assist with disposition recommendations today.  Check BMET and TSH today.  Stop ASA and continue anticoagulation with coumadin.  Hopefully to home vs SNF soon depending on progress with PT.  I spoke at length with patients daughter at bedside.  She is very clear that the  familys desire is for his to go home.  They do understand that he is weak and will require rehab but feel that he would not do as well with SNF or inpatient rehab.  Will ask case management to assist. Hope to discharge today or tomorrow.  Co Sign: Thompson Grayer, MD 12/18/2014 9:46 AM

## 2014-12-18 NOTE — Progress Notes (Signed)
CSW (Clinical Education officer, museum) notified pt will need non-emergency ambulance home. CSW spoke with pt daughter about potential for insurance non-coverage. She confirmed they would still like transport to be arranged to: [place], [place] [address].   [name] [name], [name] [name]

## 2014-12-18 NOTE — Progress Notes (Signed)
[facility] called to add a nurses aide to his home health care services; [facility] consult placed; Aneta Mins [phone]

## 2014-12-19 ENCOUNTER — Telehealth: Payer: Self-pay | Admitting: *Deleted

## 2014-12-19 ENCOUNTER — Other Ambulatory Visit: Payer: Self-pay | Admitting: *Deleted

## 2014-12-19 DIAGNOSIS — I2699 Other pulmonary embolism without acute cor pulmonale: Secondary | ICD-10-CM | POA: Diagnosis not present

## 2014-12-19 DIAGNOSIS — R269 Unspecified abnormalities of gait and mobility: Secondary | ICD-10-CM | POA: Diagnosis not present

## 2014-12-19 DIAGNOSIS — R001 Bradycardia, unspecified: Secondary | ICD-10-CM | POA: Diagnosis not present

## 2014-12-19 DIAGNOSIS — G309 Alzheimer's disease, unspecified: Secondary | ICD-10-CM | POA: Diagnosis not present

## 2014-12-19 DIAGNOSIS — F0280 Dementia in other diseases classified elsewhere without behavioral disturbance: Secondary | ICD-10-CM | POA: Diagnosis not present

## 2014-12-19 NOTE — Progress Notes (Signed)
Orders for hospital bed faxed to [facility]; they will contact the patient's daughter when they have approval for the hospital bed; Aneta Mins [phone]

## 2014-12-19 NOTE — Patient Outreach (Signed)
12/18/14- Telephone call to patient's daughter Lilli Light, HIPAA verified,  Confirmed address is [address], [place], [place] [address], informed [name] that nurse care manager covering this area will contact her.  Sent note in epic as well as email  to Lurline Del, [facility] care management assistant informing of correct address and requested to reassign to nurse in that area.

## 2014-12-19 NOTE — Telephone Encounter (Signed)
Chart reviewed and it looks like patient was admitted primarily for bradycardia.  You are correct that the lisinopril was stopped.  One dose of lisinopril is unlikely to be problematic given medical history and status at discharge.  I completely agree with your recommendations to monitor the blood pressure at home, discontinue further lisinopril, and reassess in the [facility] on Monday.  Thank You.

## 2014-12-19 NOTE — Telephone Encounter (Signed)
Pt's daughter called with a question about pt's meds. Pt d/c from hospital last night at 8:00 PM s/p syncope and heart cath. Daughter states pt was on lisinopril before admission and noted it was not on his d/c list of meds. I looked up d/c summary and lisinopril was stopped.  She has been informed BUT she did give today's dose before calling. I asked her to monitor BP today and call for any low readings. I also scheduled a HFU appointment for Monday with PCP. I does not look like IM was involved with this admission.  Please advise if further orders.

## 2014-12-20 ENCOUNTER — Other Ambulatory Visit: Payer: Self-pay | Admitting: *Deleted

## 2014-12-20 ENCOUNTER — Telehealth: Payer: Self-pay | Admitting: Internal Medicine

## 2014-12-20 NOTE — Patient Outreach (Signed)
Referral received from hospital liaison for transition of care program.  Member has a history of Alzheimer's.  Member's daughter, [name] [name], called to initiate program.  No answer, voice message full, unable to leave a message.  Will attempt 2nd call next week.

## 2014-12-20 NOTE — Telephone Encounter (Signed)
Call to patient to confirm appointment for 12/23/14 at 9:00 and 10:45 lmtcb.

## 2014-12-20 NOTE — Patient Outreach (Signed)
Member's daughter returned phone call, member's identity confirmed.  [facility] care management services explained, daughter recalls explanation of services from V. Brewer, hospital liaison.  Daughter agrees to member being involved in the program.  She states that he has done much better since being discharged from the hospital.  She states that he could barely stand up when discharged and is now building strength up and walking around the house with the help of a walker.  She reports that [name] is the home health agency that is involved in his care and will provide home health nursing, physical therapy, and nursing aid services.  She states that they [name]) have already contacted them and has dates set to start their programs.  Daughter reports that [name] will provide nursing aide assistance 2 days a week, and they are looking into obtaining private duty nursing on the days that [name] is not available.  Daughter reports that there has been no complications from the loop recorder placement, and that the member has a follow up appointment with primary physician on Monday, and with cardiology on 12/26/14.  Daughter provided with this care manager's contact information, encouraged to call with any questions or concerns.  Will continue with transition of care calls next week.  Initial home visit scheduled for 12/30/14.  [name], BSN, [place] Management  [facility] Care Manager [phone]

## 2014-12-23 ENCOUNTER — Encounter: Payer: Self-pay | Admitting: Internal Medicine

## 2014-12-23 ENCOUNTER — Ambulatory Visit: Payer: Medicare HMO

## 2014-12-23 ENCOUNTER — Ambulatory Visit (INDEPENDENT_AMBULATORY_CARE_PROVIDER_SITE_OTHER): Payer: Commercial Managed Care - HMO | Admitting: Internal Medicine

## 2014-12-23 ENCOUNTER — Ambulatory Visit (INDEPENDENT_AMBULATORY_CARE_PROVIDER_SITE_OTHER): Payer: Commercial Managed Care - HMO | Admitting: Pharmacist

## 2014-12-23 VITALS — BP 108/70 | HR 73 | Temp 97.8°F | Wt 158.9 lb

## 2014-12-23 DIAGNOSIS — Z7901 Long term (current) use of anticoagulants: Secondary | ICD-10-CM | POA: Diagnosis not present

## 2014-12-23 DIAGNOSIS — E876 Hypokalemia: Secondary | ICD-10-CM

## 2014-12-23 DIAGNOSIS — G309 Alzheimer's disease, unspecified: Secondary | ICD-10-CM | POA: Diagnosis not present

## 2014-12-23 DIAGNOSIS — R001 Bradycardia, unspecified: Secondary | ICD-10-CM | POA: Diagnosis not present

## 2014-12-23 DIAGNOSIS — I2699 Other pulmonary embolism without acute cor pulmonale: Secondary | ICD-10-CM | POA: Diagnosis not present

## 2014-12-23 DIAGNOSIS — Z7902 Long term (current) use of antithrombotics/antiplatelets: Secondary | ICD-10-CM

## 2014-12-23 DIAGNOSIS — R269 Unspecified abnormalities of gait and mobility: Secondary | ICD-10-CM | POA: Diagnosis not present

## 2014-12-23 DIAGNOSIS — F0280 Dementia in other diseases classified elsewhere without behavioral disturbance: Secondary | ICD-10-CM | POA: Diagnosis not present

## 2014-12-23 LAB — BASIC METABOLIC PANEL WITH GFR
BUN: 10 mg/dL (ref 6–23)
CO2: 27 mEq/L (ref 19–32)
Calcium: 9.1 mg/dL (ref 8.4–10.5)
Chloride: 108 mEq/L (ref 96–112)
Creat: 0.96 mg/dL (ref 0.50–1.35)
GFR, EST NON AFRICAN AMERICAN: 74 mL/min
GFR, Est African American: 85 mL/min
Glucose, Bld: 98 mg/dL (ref 70–99)
Potassium: 4 mEq/L (ref 3.5–5.3)
Sodium: 146 mEq/L — ABNORMAL HIGH (ref 135–145)

## 2014-12-23 LAB — CBC
HEMATOCRIT: 30.5 % — AB (ref 39.0–52.0)
Hemoglobin: 10.2 g/dL — ABNORMAL LOW (ref 13.0–17.0)
MCH: 30.2 pg (ref 26.0–34.0)
MCHC: 33.8 g/dL (ref 30.0–36.0)
MCV: 90.2 fL (ref 78.0–100.0)
MPV: 8.5 fL — ABNORMAL LOW (ref 8.6–12.4)
Platelets: 270 10*3/uL (ref 150–400)
RBC: 3.38 MIL/uL — AB (ref 4.22–5.81)
RDW: 14.8 % (ref 11.5–15.5)
WBC: 3.8 10*3/uL — AB (ref 4.0–10.5)

## 2014-12-23 LAB — POCT INR: INR: 4.1

## 2014-12-23 MED ORDER — ZOSTER VACCINE LIVE 19400 UNT/0.65ML ~~LOC~~ SOLR: 0.6500 mL | Freq: Once | SUBCUTANEOUS | Status: DC

## 2014-12-23 NOTE — Patient Instructions (Signed)
Patient instructed to take medications as defined in the Anti-coagulation Track section of this encounter.  Patient instructed to OMIT today's dose.  Patient verbalized understanding of these instructions.    

## 2014-12-23 NOTE — Patient Instructions (Signed)
1. I will call you if there are lab abnormalities.  Please return to see Dr. Elie Confer on Monday.  Come back to see me in 1-2 months.   2. Please take all medications as prescribed.    3. If you have worsening of your symptoms or new symptoms arise, please call the clinic ([phone]), or go to the ER immediately if symptoms are severe.

## 2014-12-23 NOTE — Progress Notes (Signed)
Anti-Coagulation Progress Note  [name] [name] is a 81 y.o. male who is currently on an anti-coagulation regimen.    RECENT RESULTS: Recent results are below, the most recent result is correlated with a dose of 40 mg. per week: Lab Results  Component Value Date   INR 4.10 12/23/2014   INR 2.57* 12/18/2014   INR 2.24* 12/17/2014    ANTI-COAG DOSE: Anticoagulation Dose Instructions as of 12/23/2014      [name] Tue Wed Thu Fri Sat   New Dose 5 mg 5 mg 5 mg 5 mg 5 mg 5 mg 5 mg       ANTICOAG SUMMARY: Anticoagulation Episode Summary    Current INR goal 2.0-3.0  Next INR check 12/30/2014  INR from last check 4.10! (12/23/2014)  Weekly max dose   Target end date Indefinite  [facility]  Preferred lab   Send INR reminders to ANTICOAG IMP   Indications  Pulmonary embolism [I26.99] Encounter for long-term use of antiplatelets/antithrombotics [Z79.02]        Comments History of multiple venous embolic episodes. Will continue to annual re-evaluate continued need for warfarin weighing risks vs. benefits.       Anticoagulation Care Providers    Provider Role Specialty Phone number   Bertha Stakes, MD  Internal Medicine [phone]      ANTICOAG TODAY: Anticoagulation Summary as of 12/23/2014    INR goal 2.0-3.0  Selected INR 4.10! (12/23/2014)  Next INR check 12/30/2014  Target end date Indefinite   Indications  Pulmonary embolism [I26.99] Encounter for long-term use of antiplatelets/antithrombotics [Z79.02]      Anticoagulation Episode Summary    [facility] check [facility]   Preferred lab    Send INR reminders to ANTICOAG IMP   Comments History of multiple venous embolic episodes. Will continue to annual re-evaluate continued need for warfarin weighing risks vs. benefits.     Anticoagulation Care Providers    Provider Role Specialty Phone number   Bertha Stakes, MD  Internal Medicine [phone]      PATIENT INSTRUCTIONS: Patient  Instructions  Patient instructed to take medications as defined in the Anti-coagulation Track section of this encounter.  Patient instructed to OMIT today's dose.  Patient verbalized understanding of these instructions.       FOLLOW-UP Return in 7 days (on 12/30/2014) for Follow up INR at 1000h.  Jorene Guest, [name] Pharm.D., CACP

## 2014-12-23 NOTE — Progress Notes (Signed)
   Subjective:    Patient ID: [name] [name], male    DOB: 01/20/1931, 81 y.o.   MRN: [id number]  HPI Comments: Mr. Kirschenmann is an 81 year old male with a PMH as below here for hospital follow-up after admission (03/19-03/30/16) for syncopal event in the setting of bradycardia.  PPM had been recommended in the past for symptomatic bradycardia.  After discussion with cardiology during this admission, the decision was made to implant a LINQ instead of going immediately to PPM placement.      Past Medical History  Diagnosis Date  . PE (pulmonary embolism)     unprovoked PE completed 6 months of warfarin, warfarin d/ced 02/12/2010, repeat PE 07/10/2010 after a long car ride and now on lifelong coumadin.  [name] Hypertension   . Pituitary microadenoma     incidental finding CT 12/2009  . Prostate cancer   . Depression   . Hyperlipidemia   . Coronary artery disease, non-occlusive     with history of MI; Cath 2008 w multivessel nonobstructive CAD  . Dementia     Review of Systems  Constitutional: Negative for fever, chills and appetite change.  Respiratory: Negative for shortness of breath.   Cardiovascular: Negative for chest pain, palpitations and leg swelling.  Gastrointestinal: Negative for nausea, vomiting, diarrhea and constipation.  Neurological: Negative for syncope and light-headedness.       Filed Vitals:   12/23/14 1140  BP: 108/70  Pulse: 73  Temp: 97.8 F (36.6 C)  TempSrc: Oral  Weight: 158 lb 14.4 oz (72.077 kg)  SpO2: 99%   Objective:   Physical Exam  Constitutional: He appears well-developed. No distress.  HENT:  Head: Normocephalic and atraumatic.  Mouth/Throat: Oropharynx is clear and moist. No oropharyngeal exudate.  Eyes: EOM are normal. Pupils are equal, round, and reactive to light.  Neck: Neck supple.  Cardiovascular: Normal rate, regular rhythm and normal heart sounds.  Exam reveals no gallop and no friction rub.   No murmur heard. Pulmonary/Chest:  Effort normal and breath sounds normal. No respiratory distress. He has no wheezes. He has no rales.  Abdominal: Soft. Bowel sounds are normal. He exhibits no distension and no mass. There is no tenderness. There is no rebound and no guarding.  Musculoskeletal: Normal range of motion. He exhibits no edema or tenderness.  Neurological: He is alert. No cranial nerve deficit.  Skin: Skin is warm. He is not diaphoretic.  Psychiatric: He has a normal mood and affect. His behavior is normal.  Vitals reviewed.         Assessment & Plan:  Please see problem based and assessment and plan.

## 2014-12-24 ENCOUNTER — Other Ambulatory Visit: Payer: Self-pay | Admitting: *Deleted

## 2014-12-24 DIAGNOSIS — R55 Syncope and collapse: Secondary | ICD-10-CM

## 2014-12-24 DIAGNOSIS — F0280 Dementia in other diseases classified elsewhere without behavioral disturbance: Secondary | ICD-10-CM | POA: Diagnosis not present

## 2014-12-24 DIAGNOSIS — I2699 Other pulmonary embolism without acute cor pulmonale: Secondary | ICD-10-CM | POA: Diagnosis not present

## 2014-12-24 DIAGNOSIS — G309 Alzheimer's disease, unspecified: Secondary | ICD-10-CM | POA: Diagnosis not present

## 2014-12-24 DIAGNOSIS — R269 Unspecified abnormalities of gait and mobility: Secondary | ICD-10-CM | POA: Diagnosis not present

## 2014-12-24 DIAGNOSIS — R001 Bradycardia, unspecified: Secondary | ICD-10-CM | POA: Diagnosis not present

## 2014-12-24 NOTE — Patient Outreach (Signed)
This care manager called member's daughter for transition of care.  Daughter states that the member continues to improve since discharge and our last conversation on Friday.  She states that the physical therapist has been working with him and reports that they don't believe the member will need to use the walker for ambulation long term.  She reports that he has not had any complications with his loop recorder and has an appointment with the cardiologist on this Thursday, 4/7, for a follow up.  She states that the member's blood pressure has been checked daily, often twice a day, and has ranged from 108/70 to 120/72, and that his pulse rate has been in the 70s.  She reports that he continues to take his medications as prescribed with assistance.  She states that his follow up appointment with his primary physician went well and that there are no concerns at this time.  Daughter states that she is not legally the member's power of attorney, but that they would like to have paperwork discussed and completed for power of attorney and advanced directive.  Social worker consult placed.    Daughter confirms initial home visit scheduled for next week.  Denies any further questions at this time.  [name], BSN, [place] Management  [facility] Care Manager [phone]

## 2014-12-25 DIAGNOSIS — R269 Unspecified abnormalities of gait and mobility: Secondary | ICD-10-CM | POA: Diagnosis not present

## 2014-12-25 DIAGNOSIS — F0280 Dementia in other diseases classified elsewhere without behavioral disturbance: Secondary | ICD-10-CM | POA: Diagnosis not present

## 2014-12-25 DIAGNOSIS — G309 Alzheimer's disease, unspecified: Secondary | ICD-10-CM | POA: Diagnosis not present

## 2014-12-25 DIAGNOSIS — R001 Bradycardia, unspecified: Secondary | ICD-10-CM | POA: Diagnosis not present

## 2014-12-25 DIAGNOSIS — I2699 Other pulmonary embolism without acute cor pulmonale: Secondary | ICD-10-CM | POA: Diagnosis not present

## 2014-12-25 NOTE — Assessment & Plan Note (Addendum)
[name] [name] and his daughter both agree he is doing much better since discharge home.  He denies acute complaint; specifically he denies chest pain, palpitations, dyspnea, lightheadedness or syncope.  Pulse is 72, BP 108/70.  ACEI was d/c during admission due to soft BP but he remains on low dose amlodipine (2.5mg ).  Short-term SNF for rehab was recommended at d/c but the patient and family opted for him to return home with his daughter.  HH RN/PT/OT/SW have been ordered and [facility] is involved.  His daughter reports his strength has improved, he is able to stand and he is doing well with PT.  The patient has close follow-up scheduled with Cardiology.    - keep Cardiology appts and follow-up with me in 1-2 months or prn - BMP and CBC today since K and Hgb were low during admission

## 2014-12-25 NOTE — Progress Notes (Signed)
Internal Medicine Clinic Attending  Case discussed with Dr. Wilson soon after the resident saw the patient.  We reviewed the resident's history and exam and pertinent patient test results.  I agree with the assessment, diagnosis, and plan of care documented in the resident's note.  

## 2014-12-25 NOTE — Progress Notes (Signed)
I have reviewed Dr. Gladstone Pih note.  [name] [name] is on anticoagulation for PE.  His INR was elevated to 4.1 and his coumadin was decreased.

## 2014-12-26 ENCOUNTER — Ambulatory Visit (INDEPENDENT_AMBULATORY_CARE_PROVIDER_SITE_OTHER): Payer: Commercial Managed Care - HMO | Admitting: *Deleted

## 2014-12-26 ENCOUNTER — Ambulatory Visit: Payer: Medicare HMO

## 2014-12-26 DIAGNOSIS — R001 Bradycardia, unspecified: Secondary | ICD-10-CM | POA: Diagnosis not present

## 2014-12-26 DIAGNOSIS — I2699 Other pulmonary embolism without acute cor pulmonale: Secondary | ICD-10-CM | POA: Diagnosis not present

## 2014-12-26 DIAGNOSIS — R269 Unspecified abnormalities of gait and mobility: Secondary | ICD-10-CM | POA: Diagnosis not present

## 2014-12-26 DIAGNOSIS — G309 Alzheimer's disease, unspecified: Secondary | ICD-10-CM | POA: Diagnosis not present

## 2014-12-26 DIAGNOSIS — F0280 Dementia in other diseases classified elsewhere without behavioral disturbance: Secondary | ICD-10-CM | POA: Diagnosis not present

## 2014-12-26 LAB — MDC_IDC_ENUM_SESS_TYPE_INCLINIC
Date Time Interrogation Session: 20160407151614
MDC IDC SET ZONE DETECTION INTERVAL: 400 ms
Zone Setting Detection Interval: 2000 ms
Zone Setting Detection Interval: 3000 ms

## 2014-12-27 ENCOUNTER — Other Ambulatory Visit: Payer: Self-pay

## 2014-12-27 NOTE — Progress Notes (Signed)
Wound check in clinic s/p ILR implant. Steri strips removed. Wound well healed without redness or edema.  Pt with 0 tachy episodes; 0 brady episodes; 0 asystole; 0 AF episodes; 0 symptom episodes. Plan to follow up via [facility] and with SK on 6/7 @ 1330.

## 2014-12-27 NOTE — Patient Outreach (Signed)
CSW called and spoke with daughter via phone. She reports that patient has the help of social worker through [place] that will help with developing advanced directives. Daughter currently does not identify any other social needs and denies need for help with food, transportation, etc. CSW made plans to follow-up with assigned nurse CM and doctor. Family was provided with contact number should they change their minds and desire assistance with their goals.

## 2014-12-30 ENCOUNTER — Ambulatory Visit (INDEPENDENT_AMBULATORY_CARE_PROVIDER_SITE_OTHER): Payer: Commercial Managed Care - HMO | Admitting: Pharmacist

## 2014-12-30 ENCOUNTER — Ambulatory Visit: Payer: Medicare HMO | Admitting: *Deleted

## 2014-12-30 ENCOUNTER — Other Ambulatory Visit: Payer: Self-pay | Admitting: *Deleted

## 2014-12-30 DIAGNOSIS — R001 Bradycardia, unspecified: Secondary | ICD-10-CM | POA: Diagnosis not present

## 2014-12-30 DIAGNOSIS — I2699 Other pulmonary embolism without acute cor pulmonale: Secondary | ICD-10-CM | POA: Diagnosis not present

## 2014-12-30 DIAGNOSIS — G309 Alzheimer's disease, unspecified: Secondary | ICD-10-CM | POA: Diagnosis not present

## 2014-12-30 DIAGNOSIS — R269 Unspecified abnormalities of gait and mobility: Secondary | ICD-10-CM | POA: Diagnosis not present

## 2014-12-30 DIAGNOSIS — Z7902 Long term (current) use of antithrombotics/antiplatelets: Secondary | ICD-10-CM | POA: Diagnosis not present

## 2014-12-30 DIAGNOSIS — F0280 Dementia in other diseases classified elsewhere without behavioral disturbance: Secondary | ICD-10-CM | POA: Diagnosis not present

## 2014-12-30 LAB — POCT INR: INR: 2.4

## 2014-12-30 NOTE — Patient Outreach (Signed)
Called member's daughter, [name], to confirm appointment for 11 am this morning.  [name] states that they are currently at the internal medicine clinic to have the member's INR checked.  She states that she does not think that they will be back home by 11.  Requested the daughter to call this care manager back after appointment done to reschedule initial home visit.  [name], BSN, [place] Management  [facility] Care Manager [phone]

## 2014-12-30 NOTE — Progress Notes (Signed)
Anti-Coagulation Progress Note  [name] [name] is a 82 y.o. male who is currently on an anti-coagulation regimen.    RECENT RESULTS: Recent results are below, the most recent result is correlated with a dose of 35 mg. per week: Lab Results  Component Value Date   INR 2.40 12/30/2014   INR 4.10 12/23/2014   INR 2.57* 12/18/2014    ANTI-COAG DOSE: Anticoagulation Dose Instructions as of 12/30/2014      [name] Tue Wed Thu Fri Sat   New Dose 5 mg 5 mg 5 mg 5 mg 5 mg 5 mg 5 mg       ANTICOAG SUMMARY: Anticoagulation Episode Summary    Current INR goal 2.0-3.0  Next INR check 01/20/2015  INR from last check 2.40 (12/30/2014)  Weekly max dose   Target end date Indefinite  [facility] check [facility]  Preferred lab   Send INR reminders to ANTICOAG IMP   Indications  Pulmonary embolism [I26.99] Encounter for long-term use of antiplatelets/antithrombotics [Z79.02]        Comments History of multiple venous embolic episodes. Will continue to annual re-evaluate continued need for warfarin weighing risks vs. benefits.       Anticoagulation Care Providers    Provider Role Specialty Phone number   Bertha Stakes, MD  Internal Medicine [phone]      ANTICOAG TODAY: Anticoagulation Summary as of 12/30/2014    INR goal 2.0-3.0  Selected INR 2.40 (12/30/2014)  Next INR check 01/20/2015  Target end date Indefinite   Indications  Pulmonary embolism [I26.99] Encounter for long-term use of antiplatelets/antithrombotics [Z79.02]      Anticoagulation Episode Summary    INR check location [facility]   Preferred lab    Send INR reminders to ANTICOAG IMP   Comments History of multiple venous embolic episodes. Will continue to annual re-evaluate continued need for warfarin weighing risks vs. benefits.     Anticoagulation Care Providers    Provider Role Specialty Phone number   Bertha Stakes, MD  Internal Medicine [phone]      PATIENT INSTRUCTIONS: Patient  Instructions  Patient instructed to take medications as defined in the Anti-coagulation Track section of this encounter.  Patient instructed to take today's dose.  Patient verbalized understanding of these instructions.       FOLLOW-UP Return in 3 weeks (on 01/20/2015) for Follow up INR at 1100h.  Jorene Guest, [name] Pharm.D., CACP

## 2014-12-30 NOTE — Patient Instructions (Signed)
Patient instructed to take medications as defined in the Anti-coagulation Track section of this encounter.  Patient instructed to take today's dose.  Patient verbalized understanding of these instructions.    

## 2014-12-31 DIAGNOSIS — R001 Bradycardia, unspecified: Secondary | ICD-10-CM | POA: Diagnosis not present

## 2014-12-31 DIAGNOSIS — I2699 Other pulmonary embolism without acute cor pulmonale: Secondary | ICD-10-CM | POA: Diagnosis not present

## 2014-12-31 DIAGNOSIS — G309 Alzheimer's disease, unspecified: Secondary | ICD-10-CM | POA: Diagnosis not present

## 2014-12-31 DIAGNOSIS — F0280 Dementia in other diseases classified elsewhere without behavioral disturbance: Secondary | ICD-10-CM | POA: Diagnosis not present

## 2014-12-31 DIAGNOSIS — R269 Unspecified abnormalities of gait and mobility: Secondary | ICD-10-CM | POA: Diagnosis not present

## 2015-01-01 DIAGNOSIS — R001 Bradycardia, unspecified: Secondary | ICD-10-CM | POA: Diagnosis not present

## 2015-01-01 DIAGNOSIS — F0280 Dementia in other diseases classified elsewhere without behavioral disturbance: Secondary | ICD-10-CM | POA: Diagnosis not present

## 2015-01-01 DIAGNOSIS — G309 Alzheimer's disease, unspecified: Secondary | ICD-10-CM | POA: Diagnosis not present

## 2015-01-01 DIAGNOSIS — R269 Unspecified abnormalities of gait and mobility: Secondary | ICD-10-CM | POA: Diagnosis not present

## 2015-01-01 DIAGNOSIS — I2699 Other pulmonary embolism without acute cor pulmonale: Secondary | ICD-10-CM | POA: Diagnosis not present

## 2015-01-03 ENCOUNTER — Other Ambulatory Visit: Payer: Self-pay | Admitting: *Deleted

## 2015-01-03 DIAGNOSIS — I2699 Other pulmonary embolism without acute cor pulmonale: Secondary | ICD-10-CM | POA: Diagnosis not present

## 2015-01-03 DIAGNOSIS — R001 Bradycardia, unspecified: Secondary | ICD-10-CM | POA: Diagnosis not present

## 2015-01-03 DIAGNOSIS — R269 Unspecified abnormalities of gait and mobility: Secondary | ICD-10-CM | POA: Diagnosis not present

## 2015-01-03 DIAGNOSIS — F0280 Dementia in other diseases classified elsewhere without behavioral disturbance: Secondary | ICD-10-CM | POA: Diagnosis not present

## 2015-01-03 DIAGNOSIS — G309 Alzheimer's disease, unspecified: Secondary | ICD-10-CM | POA: Diagnosis not present

## 2015-01-03 NOTE — Patient Outreach (Signed)
Call placed to member's daughter, [name], for weekly transition of care follow up.  Daughter states that the member is steadily improving.  She reports that he is still involved with [name] for home health nursing and physical therapy.  She states that his INR was within the suggested range (2.4) and that he is not having any problems taking any of his medications, with her assistance.  She reports that she continues to monitor his blood pressure on a daily basis.  She reports that his blood pressure is elevated occasionally, with the highest being 152/105, but she states this is not the usual.  She states that during the times when it is elevated, the member also states that he feels as if his heart is pounding.  He denies any chest pain or shortness of breath during this time.  The daughter also reports that there has been no abnormal activity on the loop recorder.  She states that the member has been worried about his condition during these times, and that she calms him down by talking to him and rubbing his head.  The daughter states that after this is done, his blood pressure is back down.  Otherwise, she states that he has been doing great.  Daughter denies any questions or concerns at this time.  Initial home visit rescheduled for later this month, 4/25.  Encouraged to contact this care manager with any questions.    [name], BSN, [place] Management  [facility] Care Manager [phone]

## 2015-01-06 ENCOUNTER — Telehealth: Payer: Self-pay | Admitting: Cardiology

## 2015-01-06 DIAGNOSIS — R001 Bradycardia, unspecified: Secondary | ICD-10-CM | POA: Diagnosis not present

## 2015-01-06 DIAGNOSIS — I2699 Other pulmonary embolism without acute cor pulmonale: Secondary | ICD-10-CM | POA: Diagnosis not present

## 2015-01-06 DIAGNOSIS — R269 Unspecified abnormalities of gait and mobility: Secondary | ICD-10-CM | POA: Diagnosis not present

## 2015-01-06 DIAGNOSIS — F0280 Dementia in other diseases classified elsewhere without behavioral disturbance: Secondary | ICD-10-CM | POA: Diagnosis not present

## 2015-01-06 DIAGNOSIS — G309 Alzheimer's disease, unspecified: Secondary | ICD-10-CM | POA: Diagnosis not present

## 2015-01-06 NOTE — Telephone Encounter (Signed)
Pt c/o swelling: STAT is pt has developed SOB within 24 hours  1. How long have you been experiencing swelling? 1week  2. Where is the swelling located? Lower ankle and leg both feet  3.  Are you currently taking a "fluid pill"?No  4.  Are you currently SOB? No  5.  Have you traveled recently?No   Please call Jenny Reichmann from [place].

## 2015-01-06 NOTE — Telephone Encounter (Signed)
Cindy HHN from [place] calling to ask Dr Meda Coffee if the pt could be prescribed a low dose diuretic for some mild LEE.  Cindy Nurse states the pts V/S are normal at :  112/70 HR- 70. SpO2 is 96% RA.  Per [facility] the pt complains of no sob, no DOE, no chest pain, no dizziness, no palpitations, no pre-syncopal or syncopal episodes at this time.  Per Jenny Reichmann the only complaint the pt has is complains of mild edema in lower extremity.  Cindy HHN and the pts daughter requesting a low dose diuretic.  Informed Cindy RN that Dr Meda Coffee is currently out of the office today, but I will forward this message to her further review and recommendation and follow-up thereafter. Advised [facility]indy HHN to educated the pt and daughter on the pt maintaining a low sodium diet, drink water, elevated legs as much as possible to decrease the swelling, and wear compression stockings if available. [facility] verbalized understanding and agrees with this plan.

## 2015-01-07 DIAGNOSIS — R001 Bradycardia, unspecified: Secondary | ICD-10-CM | POA: Diagnosis not present

## 2015-01-07 DIAGNOSIS — I2699 Other pulmonary embolism without acute cor pulmonale: Secondary | ICD-10-CM | POA: Diagnosis not present

## 2015-01-07 DIAGNOSIS — F0280 Dementia in other diseases classified elsewhere without behavioral disturbance: Secondary | ICD-10-CM | POA: Diagnosis not present

## 2015-01-07 DIAGNOSIS — G309 Alzheimer's disease, unspecified: Secondary | ICD-10-CM | POA: Diagnosis not present

## 2015-01-07 DIAGNOSIS — R269 Unspecified abnormalities of gait and mobility: Secondary | ICD-10-CM | POA: Diagnosis not present

## 2015-01-07 MED ORDER — FUROSEMIDE 20 MG PO TABS: 20.0000 mg | ORAL_TABLET | Freq: Every day | ORAL | Status: DC

## 2015-01-07 NOTE — Telephone Encounter (Signed)
I would start furosemide 20 mg po daily and have him follow with me in 6 weeks. If this low dose doesn't make a difference for him, he should call and let us know.

## 2015-01-07 NOTE — Telephone Encounter (Signed)
Contacted the pts daughter and HHN with [facility] to inform them both that per Dr Meda Coffee she recommends the pt start taking lasix 20 mg po daily and schedule a follow-up appt with Dr Meda Coffee for 6 weeks out.  Informed both parties that per Dr Meda Coffee if this low dose diuretic doesn't make a difference for the pt, then they should notify our office.  Informed both parties that the pt should still come to his scheduled follow-up appt with Richardson Dopp PA-C on 4/29 at 1130. Informed both parties that someone from our scheduling dept will be calling the daughter to have a 6 week f/u appt made with Dr Meda Coffee.  Informed both parties that based on Scott's assessment on 4/29, the follow-up date with Dr Meda Coffee could change.  Per the pts daughter, she states that the pts LEE did go down a bit, after they elevated his extremities as instructed yesterday. Both parties verbalized understanding and agree with this plan.

## 2015-01-08 DIAGNOSIS — G309 Alzheimer's disease, unspecified: Secondary | ICD-10-CM | POA: Diagnosis not present

## 2015-01-08 DIAGNOSIS — F0280 Dementia in other diseases classified elsewhere without behavioral disturbance: Secondary | ICD-10-CM | POA: Diagnosis not present

## 2015-01-08 DIAGNOSIS — I2699 Other pulmonary embolism without acute cor pulmonale: Secondary | ICD-10-CM | POA: Diagnosis not present

## 2015-01-08 DIAGNOSIS — R269 Unspecified abnormalities of gait and mobility: Secondary | ICD-10-CM | POA: Diagnosis not present

## 2015-01-08 DIAGNOSIS — R001 Bradycardia, unspecified: Secondary | ICD-10-CM | POA: Diagnosis not present

## 2015-01-09 ENCOUNTER — Other Ambulatory Visit: Payer: Self-pay | Admitting: *Deleted

## 2015-01-09 DIAGNOSIS — R269 Unspecified abnormalities of gait and mobility: Secondary | ICD-10-CM | POA: Diagnosis not present

## 2015-01-09 DIAGNOSIS — G309 Alzheimer's disease, unspecified: Secondary | ICD-10-CM | POA: Diagnosis not present

## 2015-01-09 DIAGNOSIS — I2699 Other pulmonary embolism without acute cor pulmonale: Secondary | ICD-10-CM | POA: Diagnosis not present

## 2015-01-09 DIAGNOSIS — R001 Bradycardia, unspecified: Secondary | ICD-10-CM | POA: Diagnosis not present

## 2015-01-09 DIAGNOSIS — F0280 Dementia in other diseases classified elsewhere without behavioral disturbance: Secondary | ICD-10-CM | POA: Diagnosis not present

## 2015-01-09 NOTE — Patient Outreach (Signed)
Call placed to member's daughter, [name] [name], for transition of care to follow up on member's progress since discharge.  Daughter states that the member has been doing well with the exception of swelling in his feet over the past week.  She states that the home health nurse with [name] was able to call the physician and obtain a medication for the swelling.  She was not home at the time and was unable to name the medication that was prescribed.  This care manager checked the medication record, verifying that the new medication that was added this week was Lasix.  Daughter states that since he has been taking this medication, along with elevating his legs/feet, the swelling has decreased.  She states that he has an appointment with cardiology on next week, 4/29, and they will follow up on the swelling and new medication at that time.  She states that she encourages the member to walk to help with the circulation and swelling.  She denies any further concerns at this time.  Initial home visit confirmed for Monday of next week.  [name], BSN, [place] Management  [facility] Care Manager [phone]

## 2015-01-09 NOTE — Telephone Encounter (Signed)
Per Dr Meda Coffee the pts 01/17/15 appt with Richardson Dopp PA-C is sufficient enough for follow-up.

## 2015-01-10 DIAGNOSIS — F0280 Dementia in other diseases classified elsewhere without behavioral disturbance: Secondary | ICD-10-CM | POA: Diagnosis not present

## 2015-01-10 DIAGNOSIS — R001 Bradycardia, unspecified: Secondary | ICD-10-CM | POA: Diagnosis not present

## 2015-01-10 DIAGNOSIS — G309 Alzheimer's disease, unspecified: Secondary | ICD-10-CM | POA: Diagnosis not present

## 2015-01-10 DIAGNOSIS — R269 Unspecified abnormalities of gait and mobility: Secondary | ICD-10-CM | POA: Diagnosis not present

## 2015-01-10 DIAGNOSIS — I2699 Other pulmonary embolism without acute cor pulmonale: Secondary | ICD-10-CM | POA: Diagnosis not present

## 2015-01-13 ENCOUNTER — Other Ambulatory Visit: Payer: Self-pay | Admitting: *Deleted

## 2015-01-13 ENCOUNTER — Encounter: Payer: Self-pay | Admitting: *Deleted

## 2015-01-13 DIAGNOSIS — G309 Alzheimer's disease, unspecified: Secondary | ICD-10-CM | POA: Diagnosis not present

## 2015-01-13 DIAGNOSIS — F0280 Dementia in other diseases classified elsewhere without behavioral disturbance: Secondary | ICD-10-CM | POA: Diagnosis not present

## 2015-01-13 DIAGNOSIS — I2699 Other pulmonary embolism without acute cor pulmonale: Secondary | ICD-10-CM | POA: Diagnosis not present

## 2015-01-13 DIAGNOSIS — R269 Unspecified abnormalities of gait and mobility: Secondary | ICD-10-CM | POA: Diagnosis not present

## 2015-01-13 DIAGNOSIS — R001 Bradycardia, unspecified: Secondary | ICD-10-CM | POA: Diagnosis not present

## 2015-01-13 NOTE — Patient Outreach (Signed)
[facility]) Care Management   01/13/2015  [name] [name] June 01, 1931 [id number]  [name] [name] is an 81 y.o. male  Subjective:   Member states that he is doing ok.  States that he didn't sleep well last night, relates it to feeling "uncomfortable."  He reports a pain level of 7/10, pointing at his right chest/flank area.  Member's daughter reports that he has broken ribs years ago and has had trouble with pain ever since.  Objective:   Review of Systems  Psychiatric/Behavioral: Positive for depression.    Physical Exam  Constitutional: He appears well-developed and well-nourished.  Neck: Normal range of motion.  Cardiovascular: Normal rate, regular rhythm and normal heart sounds.   Respiratory: Effort normal and breath sounds normal.  GI: Soft. Bowel sounds are normal.  Musculoskeletal: Normal range of motion.  Neurological: He is alert.  Skin: Skin is warm and dry.   BP 122/76 mmHg  Pulse 71  Resp 16  Ht 1.676 m ($Remove'5\' 6"'BirRaAC$ )  Wt 158 lb (71.668 kg)  BMI 25.51 kg/m2  SpO2 97%   Current Medications:   Current Outpatient Prescriptions  Medication Sig Dispense Refill  . acetaminophen (TYLENOL) 325 MG tablet Take 2 tablets (650 mg total) by mouth every 4 (four) hours as needed for headache or mild pain.    [name] amLODipine (NORVASC) 2.5 MG tablet Take 1 tablet (2.5 mg total) by mouth daily. 30 tablet 6  . aspirin EC 81 MG EC tablet Take 1 tablet (81 mg total) by mouth daily.    [name] atorvastatin (LIPITOR) 40 MG tablet TAKE 1 TABLET (40 MG TOTAL) BY MOUTH EVERY EVENING. 90 tablet 1  . b complex vitamins tablet Take 1 tablet by mouth daily.    . Calcium Carbonate-Vitamin D (CALCIUM-VITAMIN D) 500-200 MG-UNIT per tablet Take 1 tablet by mouth daily.    . carbidopa-levodopa (SINEMET IR) 25-100 MG per tablet Take 1 tablet by mouth 2 (two) times daily. 60 tablet 3  . fish oil-omega-3 fatty acids 1000 MG capsule Take 2 g by mouth daily.     . furosemide (LASIX) 20 MG tablet  Take 1 tablet (20 mg total) by mouth daily. 90 tablet 3  . Multiple Vitamins-Minerals (MULTIVITAMIN WITH MINERALS) tablet Take 1 tablet by mouth daily.    [name] NAMENDA XR 28 MG CP24 Take 28 mg by mouth daily. 30 capsule 12  . omeprazole (PRILOSEC) 20 MG capsule Take 1 capsule (20 mg total) by mouth daily. 90 capsule 1  . warfarin (COUMADIN) 5 MG tablet Take 1 tablet ($RemoveB'5mg'YvZQRAyD$ ) by mouth on Monday, Tuesday, Thursday, Friday and Saturday.  Take 1.5 tablets (7.$RemoveBefore'5mg'ranOzRkiDmFFL$ ) by mouth on Sunday and Wednesday. 40 tablet 1  . zoster vaccine live, PF, (ZOSTAVAX) 37106 UNT/0.65ML injection Inject 19,400 Units into the skin once. (Patient not taking: Reported on 01/13/2015) 1 each 0   No current facility-administered medications for this visit.    Functional Status:   In your present state of health, do you have any difficulty performing the following activities: 01/13/2015 12/23/2014  Hearing? Tempie Donning  Vision? Y Y  Difficulty concentrating or making decisions? Tempie Donning  Walking or climbing stairs? Y Y  Dressing or bathing? Y N  Doing errands, shopping? Tempie Donning  Preparing Food and eating ? Y -  Using the Toilet? N -  In the past six months, have you accidently leaked urine? Y -  Do you have problems with loss of bowel control? N -  Managing your Medications? Y -  Managing your Finances? Y -  Housekeeping or managing your Housekeeping? Y -    Fall/Depression Screening:    PHQ 2/9 Scores 01/13/2015 12/23/2014 10/09/2014 09/30/2014 04/18/2014 02/13/2014 10/09/2013  PHQ - 2 Score 4 0 0 1 0 0 0  PHQ- 9 Score 14 - - - - - -    Assessment:    Met with member at scheduled time.  Daughter present during visit.  Consent obtained prior to discharge from hospital per V. Brewer, hospital liaison.  Member and daughter continue to agree to [place] Management services.  Daughter states that she has received the paperwork for Power of Oneta Rack and Advanced Directives from the social worker with [place], and will complete it soon.  She states that  both her mother (who also has Alzheimer's) and the member live with her, and that she is in the process of getting her brothers to assist more with taking care of the member.    Daughter reports that the member still has the services of [facility] for physical therapy and nursing a couple times a week, but that this will end soon.  She reports that there is a Psychologist, counselling that helps with the member twice a week, for an hour each day.  She states that she has received paperwork from the [place] worker to apply for nursing care assistance with the [facility] department.  She reports that he continues to need extra help when performing his ADLs.  She states that the member will soon complete his program with the physical therapist, but she feels that he may need a little more time with them.  Advised to discuss this with the therapist at the next visit and request for an extension.    Depression screening complete.  Member states that he sometimes feel as if he can't do anything anymore.  He feels that a lot was taken away from him, including his car, his church, and his ability to do wood work.  Daughter states that she wishes that she could get the member out and active more, but is unable to do so because of taking care of both of her parents.  Resources such as PACE discussed, member states that he is willing to obtain more information on the subject.    Daughter reports that over the past couple weeks, the member has had some swelling in his legs and feet.  The member has been placed on Lasix, which she states has helped.  She also states that elevating the member's legs/feet when sitting has also helped.  Member has no documented history of heart failure, daughter states she will ask physician about the plan for Lasix and management of the member's swelling.  Daughter does not have a scale to obtain and record daily weights.  Advised to obtain scale to monitor weights and the effects of the  Lasix.  Daughter states that the member has had chronic pain in his back and right chest/flank area for years, but has only been advised to take Tylenol.  She states that this regime is not working, and at times the pain has decreased the member's ability to move about as advised.  She states that she will also ask the physician for a referral to the pain clinic.    Member denies any further concerns at this time.  [facility] calendar provided to [facility] daughter, instructed how to document the weights and blood pressure.  Contact information for this care manager provided, encouraged to contact with  any questions.  Plan:   Will contact social worker to obtain information on the PACE clinic and other options for elderly resources for increased activities.  Will also inquire about financial assistance for hearing aids. Routine home visit scheduled for next month. Will notify physician of elevated PHQ9 score.  [place]        Patient Outreach from 01/13/2015 in [place] Problem One  Recent hospital discharge related to syncope (hypotesion/bradycardia)   Care Plan for Problem One  Active   Interventions for Problem One Long Term Goal  Discussed importance of taking medicatoins as prescribed, medication list reviewed   [facility]N Long Term Goal (31-90 days)  Member will not be readmitted to hospital for the next 31 days   [facility]N Long Term Goal Start Date  12/20/14   [facility] CM Short Term Goal #1 (0-30 days)  Member will keep appointment with primary physician next week   [facility] CM Short Term Goal #1 Start Date  12/20/14   [facility] CM Short Term Goal #1 Met Date  12/23/14   [facility]N CM Short Term Goal #2 (0-30 days)  Member/Caregiver will record blood pressure daily for the next 4 weeks   [facility] CM Short Term Goal #2 Start Date  01/13/15 [Original goal date not met, start date reset]   Care Plan Problem Two  Fluid buildup   Care Plan for Problem Two  Active   [facility]N CM Short Term Goal #1 (0-30 days)   Daughter will obtain a scale for weights within the next 2 weeks   [facility] CM Short Term Goal #1 Start Date  01/13/15   [facility] CM Short Term Goal #2 (0-30 days)  Member will have daily weights documented for the next 4 weeks   [facility] CM Short Term Goal #2 Start Date  01/13/15        [name], BSN, [place] Manager [phone]

## 2015-01-14 DIAGNOSIS — R001 Bradycardia, unspecified: Secondary | ICD-10-CM | POA: Diagnosis not present

## 2015-01-14 DIAGNOSIS — I2699 Other pulmonary embolism without acute cor pulmonale: Secondary | ICD-10-CM | POA: Diagnosis not present

## 2015-01-14 DIAGNOSIS — R269 Unspecified abnormalities of gait and mobility: Secondary | ICD-10-CM | POA: Diagnosis not present

## 2015-01-14 DIAGNOSIS — F0280 Dementia in other diseases classified elsewhere without behavioral disturbance: Secondary | ICD-10-CM | POA: Diagnosis not present

## 2015-01-14 DIAGNOSIS — G309 Alzheimer's disease, unspecified: Secondary | ICD-10-CM | POA: Diagnosis not present

## 2015-01-15 ENCOUNTER — Ambulatory Visit (INDEPENDENT_AMBULATORY_CARE_PROVIDER_SITE_OTHER): Payer: Commercial Managed Care - HMO | Admitting: *Deleted

## 2015-01-15 DIAGNOSIS — R55 Syncope and collapse: Secondary | ICD-10-CM

## 2015-01-16 DIAGNOSIS — I2699 Other pulmonary embolism without acute cor pulmonale: Secondary | ICD-10-CM | POA: Diagnosis not present

## 2015-01-16 DIAGNOSIS — I2510 Atherosclerotic heart disease of native coronary artery without angina pectoris: Secondary | ICD-10-CM | POA: Insufficient documentation

## 2015-01-16 DIAGNOSIS — R001 Bradycardia, unspecified: Secondary | ICD-10-CM | POA: Diagnosis not present

## 2015-01-16 DIAGNOSIS — R269 Unspecified abnormalities of gait and mobility: Secondary | ICD-10-CM | POA: Diagnosis not present

## 2015-01-16 DIAGNOSIS — F0280 Dementia in other diseases classified elsewhere without behavioral disturbance: Secondary | ICD-10-CM | POA: Diagnosis not present

## 2015-01-16 DIAGNOSIS — G309 Alzheimer's disease, unspecified: Secondary | ICD-10-CM | POA: Diagnosis not present

## 2015-01-16 NOTE — Progress Notes (Signed)
Cardiology Office Note   Date:  01/17/2015   ID:  [name] [name], DOB 1930-12-21, MRN [id number]  PCP:  [name], DO  Cardiologist:  Dr. Ena Dawley   Electrophysiologist:  Dr. [name]   Chief Complaint  Patient presents with  . Hospitalization Follow-up  . Loss of Consciousness  . Coronary Artery Disease     History of Present Illness: [name] [name] is a 81 y.o. male with a hx of nonobstructive CAD by cardiac catheterization in 2008, bradycardia, prior recurrent pulmonary embolism on chronic Coumadin, HTN, prostate CA, HL, depression, dementia.    Admitted 3/19-3/30 with syncope. Patient was complaining of chest pain prior to his episode of syncope. Blood pressure was 70/40 by EMS with heart rate in the 40s. He was placed on transcutaneous pacing. He was noted have underlying sinus bradycardia and frequent PVCs followed by compensatory pauses.  He was maintained on epinephrine and then dopamine. Cardiac enzymes were normal. Cardiac catheterization was obtained and demonstrated nonobstructive CAD. 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 He was placed on calcium channel blocker therapy. Coumadin was resumed. Echocardiogram demonstrated EF 40-45%. He was seen by Dr. [name] with electrophysiology.  Underlying conduction system disease was noted with RBBB, first-degree AV block and prior symptomatic bradycardia noted in May 2014 that improved off of Aricept. Ultimately decision was made to place a loop recorder instead of pacemaker. He had significant weakness with inability to stand. SNF placement was offered but the patient and his family refused. After arriving home, he did have trouble getting in and out of bed and home hospital bed was ordered.  Patient is followed by home health nursing. Phone call was made to the office on 4/18 due to lower extremity edema. Low-dose Lasix was added. The patient is also followed by [facility] care management.   He returns for FU.   He is  staying with his daughter. She is here with him today. He remains weak. Over the last couple of days he has started to notice pain in his right great toe. He also has significant low back pain and would like a referral to the pain clinic. He denies chest pain. He denies significant dyspnea. He denies recurrent syncope or near-syncope. He denies orthopnea, PND or edema. Appetite is good.   Studies/Reports Reviewed Today:  LHC 12/10/14 Left main: Large caliber normal long vessel which bifurcated into an LAD and left circumflex vessel.  LAD:  Diagonal prox 40-50% (small).   LAD after Dx 50%; mid LAD with significant mid systolic bridging with narrowing up to 80% during systole that improved with IC nitroglycerin and with diastole 20% narrowed. Left circumflex: Small tortuous vessel which gave rise to 2 OM branches.  Right coronary artery: Large dominant normal vessel that supplied the PDA and PLA vessel. Left ventriculography:  EF 50-55%.  There was ectopy without definitive wall motion abnormalities.  Echo 12/09/14 - EF 40% to 45%.  Regional wall motion abnormalities cannot be excluded. Grade 1 diastolic dysfunction.   Past Medical History  Diagnosis Date  . PE (pulmonary embolism)     unprovoked PE completed 6 months of warfarin, warfarin d/ced 02/12/2010, repeat PE 07/10/2010 after a long car ride and now on lifelong coumadin.  [name] Hypertension   . Pituitary microadenoma     incidental finding CT 12/2009  . Prostate cancer   . Depression   . Hyperlipidemia   . Coronary artery disease, non-occlusive     with history of MI; Cath 2008 w  multivessel nonobstructive CAD  . Dementia     Past Surgical History  Procedure Laterality Date  . Prostatectomy    . Left heart catheterization with coronary angiogram N/A 12/10/2014    Procedure: LEFT HEART CATHETERIZATION WITH CORONARY ANGIOGRAM;  Surgeon: [name], MD;  Location: [facility] CATH LAB;  Service: Cardiovascular;  Laterality: N/A;  . Loop  recorder implant N/A 12/16/2014    Procedure: LOOP RECORDER IMPLANT;  Surgeon: [name], MD;  Location: [facility] CATH LAB;  Service: Cardiovascular;  Laterality: N/A;     Current Outpatient Prescriptions  Medication Sig Dispense Refill  . acetaminophen (TYLENOL) 325 MG tablet Take 2 tablets (650 mg total) by mouth every 4 (four) hours as needed for headache or mild pain.    [name] amLODipine (NORVASC) 2.5 MG tablet Take 1 tablet (2.5 mg total) by mouth daily. 30 tablet 6  . aspirin EC 81 MG EC tablet Take 1 tablet (81 mg total) by mouth daily.    [name] atorvastatin (LIPITOR) 40 MG tablet TAKE 1 TABLET (40 MG TOTAL) BY MOUTH EVERY EVENING. 90 tablet 1  . b complex vitamins tablet Take 1 tablet by mouth daily.    . Calcium Carbonate-Vitamin D (CALCIUM-VITAMIN D) 500-200 MG-UNIT per tablet Take 1 tablet by mouth daily.    . carbidopa-levodopa (SINEMET IR) 25-100 MG per tablet Take 1 tablet by mouth 2 (two) times daily. 60 tablet 3  . fish oil-omega-3 fatty acids 1000 MG capsule Take 2 g by mouth daily.     . furosemide (LASIX) 20 MG tablet Take 1 tablet (20 mg total) by mouth daily. 90 tablet 3  . Multiple Vitamins-Minerals (MULTIVITAMIN WITH MINERALS) tablet Take 1 tablet by mouth daily.    [name] NAMENDA XR 28 MG CP24 Take 28 mg by mouth daily. 30 capsule 12  . omeprazole (PRILOSEC) 20 MG capsule Take 1 capsule (20 mg total) by mouth daily. 90 capsule 1  . warfarin (COUMADIN) 5 MG tablet Take 1 tablet (5mg ) by mouth on Monday, Tuesday, Thursday, Friday and Saturday.  Take 1.5 tablets (7.5mg ) by mouth on Sunday and Wednesday. 40 tablet 1  . zoster vaccine live, PF, (ZOSTAVAX) 91478 UNT/0.65ML injection Inject 19,400 Units into the skin once. 1 each 0  . predniSONE (STERAPRED UNI-PAK 21 TAB) 5 MG (21) TBPK tablet Take 1 tablet (5 mg total) by mouth daily. 21 tablet 0   No current facility-administered medications for this visit.    Allergies:   Aricept and Other    Social History:  The patient  reports  that he has never smoked. He has never used smokeless tobacco. He reports that he does not drink alcohol or use illicit drugs.   Family History:  The patient's family history includes Cervical cancer in his other; Dementia in his mother; Hypertension in his child and father; Lung cancer in his other; Stroke in his other. There is no history of Heart attack.    ROS:   Please see the history of present illness.   Review of Systems  Cardiovascular: Positive for leg swelling.  Musculoskeletal: Positive for back pain.  Psychiatric/Behavioral: The patient is nervous/anxious.   All other systems reviewed and are negative.     PHYSICAL EXAM: VS:  BP 120/60 mmHg  Pulse 71  Ht 5\' 6"  (1.676 m)  Wt 156 lb (70.761 kg)  BMI 25.19 kg/m2    Wt Readings from Last 3 Encounters:  01/17/15 156 lb (70.761 kg)  01/13/15 158 lb (71.668 kg)  12/23/14  158 lb 14.4 oz (72.077 kg)     GEN: Well nourished, well developed, in no acute distress HEENT: normal Neck: no JVD, no masses Cardiac:  Normal S1/S2, RRR; no murmur ,  no rubs or gallops, no edema  Respiratory:  clear to auscultation bilaterally, no wheezing, rhonchi or rales. GI: soft, nontender, nondistended, + BS MS: no deformity or atrophy; first toe MTPJ slightly erythematous and tender to palpation Skin: warm and dry  Neuro:  CNs II-XII intact, Strength and sensation are intact Psych: Normal affect   EKG:  EKG is ordered today.  It demonstrates:   NSR, HR 71, LAD, RBBB   Recent Labs: 01/31/2014: Pro B Natriuretic peptide (BNP) 41.0 12/07/2014: ALT 7; B Natriuretic Peptide 90.9 12/18/2014: TSH 0.504 12/23/2014: BUN 10; Creatinine 0.96; Hemoglobin 10.2*; Platelets 270; Potassium 4.0; Sodium 146*    Lipid Panel    Component Value Date/Time   CHOL 207* 06/30/2012 1153   TRIG 129 06/30/2012 1153   HDL 56 06/30/2012 1153   CHOLHDL 3.7 06/30/2012 1153   VLDL 26 06/30/2012 1153   LDLCALC 125* 06/30/2012 1153      ASSESSMENT AND  PLAN:  Syncope, unspecified syncope type ILR was interrogated at wound check in the last 2 weeks. No significant arrhythmias were noted at that time. The patient has not had any recurrent syncope. There is a strong suspicion for AV block causing his syncope. He is not currently driving.  Symptomatic bradycardia He is not on AV nodal blocking agents. His heart rate is currently controlled.  Recurrent pulmonary embolism Coumadin is managed by primary care.  Coronary artery disease involving native coronary artery of native heart without angina pectoris No angina. 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 Continue aspirin, statin.  No beta blocker given history of bradycardia.  HLD (hyperlipidemia) Continue statin.  Essential hypertension, benign  Controlled.  Cardiomyopathy EF 40-45% by recent echocardiogram. He is not on beta blocker due to history of bradycardia. He was placed on amlodipine given intramyocardial bridging. Given his frailty, I am hesitant to add another medication to his regimen that would lower his blood pressure. We can certainly consider adding ACE inhibitor in the future. LE edema is much improved on Lasix. Check basic metabolic panel today.  Alzheimer's dementia Follow-up with neurology as planned.  Acute gout of right foot, unspecified cause Given his comorbid illnesses and current medications, I am hesitant to place him on NSAIDs or colchicine. I will give him a prednisone Dosepak for 6 days (5 mg dose). We have advised him to follow-up with primary care next week to have his INR rechecked as well as to reevaluate his gout.  Midline back pain, unspecified location  Follow-up with primary care to arrange referral to pain clinic.  Current medicines are reviewed at length with the patient today.  Concerns regarding medicines are as outlined above.  The following changes have been made:     Prednisone Dosepak   Labs/ tests ordered today include:  Orders Placed This Encounter  Procedures  . Basic Metabolic Panel (BMET)  . EKG 12-Lead    Disposition:   FU with Dr. Meda Coffee and Dr. [name] as scheduled.   Signed, Versie Starks, MHS 01/17/2015 1:18 PM    [facility] [place], [place], [place]  [address] Phone: [phone]; Fax: [phone]

## 2015-01-17 ENCOUNTER — Ambulatory Visit (INDEPENDENT_AMBULATORY_CARE_PROVIDER_SITE_OTHER): Payer: Commercial Managed Care - HMO | Admitting: Physician Assistant

## 2015-01-17 ENCOUNTER — Telehealth: Payer: Self-pay | Admitting: *Deleted

## 2015-01-17 ENCOUNTER — Encounter: Payer: Self-pay | Admitting: Physician Assistant

## 2015-01-17 VITALS — BP 120/60 | HR 71 | Ht 66.0 in | Wt 156.0 lb

## 2015-01-17 DIAGNOSIS — R001 Bradycardia, unspecified: Secondary | ICD-10-CM

## 2015-01-17 DIAGNOSIS — I2699 Other pulmonary embolism without acute cor pulmonale: Secondary | ICD-10-CM | POA: Diagnosis not present

## 2015-01-17 DIAGNOSIS — G309 Alzheimer's disease, unspecified: Secondary | ICD-10-CM

## 2015-01-17 DIAGNOSIS — E785 Hyperlipidemia, unspecified: Secondary | ICD-10-CM

## 2015-01-17 DIAGNOSIS — R55 Syncope and collapse: Secondary | ICD-10-CM

## 2015-01-17 DIAGNOSIS — I2510 Atherosclerotic heart disease of native coronary artery without angina pectoris: Secondary | ICD-10-CM | POA: Diagnosis not present

## 2015-01-17 DIAGNOSIS — I10 Essential (primary) hypertension: Secondary | ICD-10-CM

## 2015-01-17 DIAGNOSIS — F0280 Dementia in other diseases classified elsewhere without behavioral disturbance: Secondary | ICD-10-CM

## 2015-01-17 DIAGNOSIS — M109 Gout, unspecified: Secondary | ICD-10-CM

## 2015-01-17 DIAGNOSIS — M10071 Idiopathic gout, right ankle and foot: Secondary | ICD-10-CM | POA: Diagnosis not present

## 2015-01-17 DIAGNOSIS — M5489 Other dorsalgia: Secondary | ICD-10-CM

## 2015-01-17 LAB — BASIC METABOLIC PANEL
BUN: 11 mg/dL (ref 6–23)
CALCIUM: 9.3 mg/dL (ref 8.4–10.5)
CO2: 29 mEq/L (ref 19–32)
Chloride: 106 mEq/L (ref 96–112)
Creatinine, Ser: 0.86 mg/dL (ref 0.40–1.50)
GFR: 109.54 mL/min (ref >60.00)
Glucose, Bld: 100 mg/dL — ABNORMAL HIGH (ref 70–99)
Potassium: 3.7 mEq/L (ref 3.5–5.1)
Sodium: 140 mEq/L (ref 135–145)

## 2015-01-17 MED ORDER — PREDNISONE 5 MG (21) PO TBPK: 5.0000 mg | ORAL_TABLET | Freq: Every day | ORAL | Status: DC

## 2015-01-17 NOTE — Patient Instructions (Signed)
Medication Instructions:  1. START PREDNISONE 5 MG DOSE PAK  Labwork: 1. TODAY BMET  Testing/Procedures: NONE  Follow-Up: 1. KEEP YOUR FOLLOW UP WITH DR. [name] AND DR. NELSON AS PLANNED  2. YOU WILL NEED TO FOLLOW UP WITH YOUR PRIMARY CARE PHYSICIAN NEXT WEEK FOR BACK PAIN, HAVE YOUR INR CHECKED DUE TO STARTING PREDNISONE AS WELL AS NEEDING TREATMENT FOR GOUT   Any Other Special Instructions Will Be Listed Below (If Applicable).

## 2015-01-17 NOTE — Telephone Encounter (Signed)
Call from heart care pt saw scott weaver pa today, pt c/o gout flare this am and some back pain, they would like pt seen first of next week for these problems, appt 5/2 at 1515 dr sadek and then coumadin clinic, pt's daughter needed this 1515 due to her work schedule

## 2015-01-17 NOTE — Telephone Encounter (Signed)
Pt notified of lab results with verbal understanding by phone. 

## 2015-01-20 ENCOUNTER — Ambulatory Visit (INDEPENDENT_AMBULATORY_CARE_PROVIDER_SITE_OTHER): Payer: Commercial Managed Care - HMO | Admitting: Pharmacist

## 2015-01-20 ENCOUNTER — Ambulatory Visit: Payer: Commercial Managed Care - HMO

## 2015-01-20 ENCOUNTER — Encounter: Payer: Self-pay | Admitting: Internal Medicine

## 2015-01-20 ENCOUNTER — Ambulatory Visit (INDEPENDENT_AMBULATORY_CARE_PROVIDER_SITE_OTHER): Payer: Commercial Managed Care - HMO | Admitting: Internal Medicine

## 2015-01-20 VITALS — BP 135/79 | HR 71 | Temp 98.0°F | Ht 66.0 in | Wt 156.6 lb

## 2015-01-20 DIAGNOSIS — M47816 Spondylosis without myelopathy or radiculopathy, lumbar region: Secondary | ICD-10-CM

## 2015-01-20 DIAGNOSIS — M47817 Spondylosis without myelopathy or radiculopathy, lumbosacral region: Secondary | ICD-10-CM

## 2015-01-20 DIAGNOSIS — I2699 Other pulmonary embolism without acute cor pulmonale: Secondary | ICD-10-CM | POA: Diagnosis not present

## 2015-01-20 DIAGNOSIS — Z7901 Long term (current) use of anticoagulants: Secondary | ICD-10-CM | POA: Diagnosis not present

## 2015-01-20 DIAGNOSIS — Z7902 Long term (current) use of antithrombotics/antiplatelets: Secondary | ICD-10-CM

## 2015-01-20 LAB — POCT INR: INR: 1.9

## 2015-01-20 MED ORDER — WARFARIN SODIUM 5 MG PO TABS: ORAL_TABLET | ORAL | Status: DC

## 2015-01-20 NOTE — Progress Notes (Signed)
INTERNAL MEDICINE TEACHING ATTENDING ADDENDUM - Shalicia Craghead, MD: I reviewed and discussed at the time of visit with the resident Dr. Sadek, the patient's medical history, physical examination, diagnosis and results of pertinent tests and treatment and I agree with the patient's care as documented.  

## 2015-01-20 NOTE — Patient Instructions (Signed)
Patient instructed to take medications as defined in the Anti-coagulation Track section of this encounter.  Patient instructed to take today's dose.  Patient verbalized understanding of these instructions.    

## 2015-01-20 NOTE — Progress Notes (Signed)
Anti-Coagulation Progress Note  [name] [name] is a 81 y.o. male who is currently on an anti-coagulation regimen.    RECENT RESULTS: Recent results are below, the most recent result is correlated with a dose of 35 mg. per week: Lab Results  Component Value Date   INR 1.90 01/20/2015   INR 2.40 12/30/2014   INR 4.10 12/23/2014    ANTI-COAG DOSE: Anticoagulation Dose Instructions as of 01/20/2015      Sun Mon Tue Wed Thu Fri Sat   New Dose 5 mg 7.5 mg 5 mg 5 mg 5 mg 5 mg 5 mg       ANTICOAG SUMMARY: Anticoagulation Episode Summary    Current INR goal 2.0-3.0  Next INR check 02/10/2015  INR from last check 1.90! (01/20/2015)  Weekly max dose   Target end date Indefinite  [facility]  Preferred lab   Send INR reminders to ANTICOAG IMP   Indications  Pulmonary embolism [I26.99] Encounter for long-term use of antiplatelets/antithrombotics [Z79.02]        Comments History of multiple venous embolic episodes. Will continue to annual re-evaluate continued need for warfarin weighing risks vs. benefits.       Anticoagulation Care Providers    Provider Role Specialty Phone number   Bertha Stakes, MD  Internal Medicine [phone]      ANTICOAG TODAY: Anticoagulation Summary as of 01/20/2015    INR goal 2.0-3.0  Selected INR 1.90! (01/20/2015)  Next INR check 02/10/2015  Target end date Indefinite   Indications  Pulmonary embolism [I26.99] Encounter for long-term use of antiplatelets/antithrombotics [Z79.02]      Anticoagulation Episode Summary    [facility]   Preferred lab    Send INR reminders to ANTICOAG IMP   Comments History of multiple venous embolic episodes. Will continue to annual re-evaluate continued need for warfarin weighing risks vs. benefits.     Anticoagulation Care Providers    Provider Role Specialty Phone number   Bertha Stakes, MD  Internal Medicine [phone]      PATIENT INSTRUCTIONS: Patient  Instructions  Patient instructed to take medications as defined in the Anti-coagulation Track section of this encounter.  Patient instructed to take today's dose.  Patient verbalized understanding of these instructions.       FOLLOW-UP Return in 3 weeks (on 02/10/2015) for Follow up INR at 1100h.  Jorene Guest, [name] Pharm.D., CACP

## 2015-01-20 NOTE — Progress Notes (Signed)
Subjective:   Patient ID: Angus Amini male   DOB: December 11, 1930 81 y.o.   MRN: [id number]  HPI: Eckardt Bob is a 81 y.o. man pmh as listed below presenting for concern of back pain.   He is accompanied by his daughter who participates in his care. The patient has had chronic long-standing back pain secondary to some lumbosacral spondylosis and has tried physical therapy several times with little to no improvement. The patient states that he has undergone evaluation previously but no surgeon would operate on him. He has been taking Tylenol scheduled that provides minimal relief at this time. He was recently put on steroids for his gout flare and that seems to have greatly reduced his back pain. Both his daughter and the patient would like to explore more options for sustained relief with less medication.     Past Medical History  Diagnosis Date  . PE (pulmonary embolism)     unprovoked PE completed 6 months of warfarin, warfarin d/ced 02/12/2010, repeat PE 07/10/2010 after a long car ride and now on lifelong coumadin.  [name] Hypertension   . Pituitary microadenoma     incidental finding CT 12/2009  . Prostate cancer   . Depression   . Hyperlipidemia   . Coronary artery disease, non-occlusive     with history of MI; Cath 2008 w multivessel nonobstructive CAD  . Dementia    Current Outpatient Prescriptions  Medication Sig Dispense Refill  . acetaminophen (TYLENOL) 325 MG tablet Take 2 tablets (650 mg total) by mouth every 4 (four) hours as needed for headache or mild pain.    [name] amLODipine (NORVASC) 2.5 MG tablet Take 1 tablet (2.5 mg total) by mouth daily. 30 tablet 6  . aspirin EC 81 MG EC tablet Take 1 tablet (81 mg total) by mouth daily.    [name] atorvastatin (LIPITOR) 40 MG tablet TAKE 1 TABLET (40 MG TOTAL) BY MOUTH EVERY EVENING. 90 tablet 1  . b complex vitamins tablet Take 1 tablet by mouth daily.    . Calcium Carbonate-Vitamin D (CALCIUM-VITAMIN D) 500-200 MG-UNIT per tablet Take  1 tablet by mouth daily.    . carbidopa-levodopa (SINEMET IR) 25-100 MG per tablet Take 1 tablet by mouth 2 (two) times daily. 60 tablet 3  . fish oil-omega-3 fatty acids 1000 MG capsule Take 2 g by mouth daily.     . furosemide (LASIX) 20 MG tablet Take 1 tablet (20 mg total) by mouth daily. 90 tablet 3  . Multiple Vitamins-Minerals (MULTIVITAMIN WITH MINERALS) tablet Take 1 tablet by mouth daily.    [name] NAMENDA XR 28 MG CP24 Take 28 mg by mouth daily. 30 capsule 12  . omeprazole (PRILOSEC) 20 MG capsule Take 1 capsule (20 mg total) by mouth daily. 90 capsule 1  . predniSONE (STERAPRED UNI-PAK 21 TAB) 5 MG (21) TBPK tablet Take 1 tablet (5 mg total) by mouth daily. 21 tablet 0  . warfarin (COUMADIN) 5 MG tablet Take 1 tablet (5mg ) by mouth on Monday, Tuesday, Thursday, Friday and Saturday.  Take 1.5 tablets (7.5mg ) by mouth on Sunday and Wednesday. 40 tablet 1  . zoster vaccine live, PF, (ZOSTAVAX) 40981 UNT/0.65ML injection Inject 19,400 Units into the skin once. 1 each 0   No current facility-administered medications for this visit.   Family History  Problem Relation Age of Onset  . Hypertension Father     Passed away from cerebral hemorrhage at age of 82.  [name] Dementia Mother     Passed  away  . Hypertension Child     4 adult children  . Cervical cancer Other   . Lung cancer Other   . Stroke Other   . Heart attack Neg Hx    History   Social History  . Marital Status: Married    Spouse Name: N/A  . Number of Children: 4  . Years of Education: master's   Occupational History  .      retired   Social History Main Topics  . Smoking status: Never Smoker   . Smokeless tobacco: Never Used  . Alcohol Use: No  . Drug Use: No  . Sexual Activity: Yes   Other Topics Concern  . None   Social History Narrative   Lives with daughter. Wife also has severe dementia.    Review of Systems: Pertinent items are noted in HPI. Objective:  Physical Exam: Filed Vitals:   01/20/15 1531    BP: 135/79  Pulse: 71  Temp: 98 F (36.7 C)  TempSrc: Oral  Height: 5\' 6"  (1.676 m)  Weight: 156 lb 9.6 oz (71.033 kg)  SpO2: 99%   General: sitting in wheelchair, NAD Cardiac: RRR, no rubs, murmurs or gallops Pulm: clear to auscultation bilaterally, moving normal volumes of air Abd: soft, nontender, nondistended, BS present Ext: warm and well perfused, no pedal edema Neuro: alert and oriented X3, cranial nerves II-XII grossly intact  Assessment & Plan:  Please see problem oriented charting  Pt discussed with Dr. Dareen Piano

## 2015-01-20 NOTE — Assessment & Plan Note (Signed)
Pt having significant relief with systemic steroids at this time but not recommended for long term management. Patient having no new symptoms or any red flags for cauda equina myelopathy. -Referral to neurosurgery for possible corticosteroid injections -Follow-up if pain fails to improve once off steroids

## 2015-01-20 NOTE — Patient Instructions (Signed)
General Instructions:   Please try to bring all your medicines next time. This will help [name] keep you safe from mistakes.   Progress Toward Treatment Goals:  Treatment Goal 04/11/2013  Blood pressure at goal  Prevent falls improved    Self Care Goals & Plans:  Self Care Goal 01/20/2015  Manage my medications take my medicines as prescribed; bring my medications to every visit; refill my medications on time  Monitor my health -  Eat healthy foods drink diet soda or water instead of juice or soda; eat more vegetables; eat foods that are low in salt; eat baked foods instead of fried foods; eat fruit for snacks and desserts  Be physically active -  Prevent falls -  Meeting treatment goals -    No flowsheet data found.   Care Management & Community Referrals:  Referral 01/25/2013  Referrals made to community resources exercise/physical therapy

## 2015-01-21 DIAGNOSIS — I2699 Other pulmonary embolism without acute cor pulmonale: Secondary | ICD-10-CM | POA: Diagnosis not present

## 2015-01-21 DIAGNOSIS — R001 Bradycardia, unspecified: Secondary | ICD-10-CM | POA: Diagnosis not present

## 2015-01-21 DIAGNOSIS — F0280 Dementia in other diseases classified elsewhere without behavioral disturbance: Secondary | ICD-10-CM | POA: Diagnosis not present

## 2015-01-21 DIAGNOSIS — R269 Unspecified abnormalities of gait and mobility: Secondary | ICD-10-CM | POA: Diagnosis not present

## 2015-01-21 DIAGNOSIS — G309 Alzheimer's disease, unspecified: Secondary | ICD-10-CM | POA: Diagnosis not present

## 2015-01-21 NOTE — Progress Notes (Signed)
INTERNAL MEDICINE TEACHING ATTENDING ADDENDUM - [name] [name] M.D  °Duration- indefinite, Indication- PE, INR- sub therapeutic. Agree with pharmacy recommendations as outlined in their note.  ° ° ° °

## 2015-01-21 NOTE — Progress Notes (Signed)
Loop recorder 

## 2015-01-22 ENCOUNTER — Encounter: Payer: Self-pay | Admitting: *Deleted

## 2015-01-23 ENCOUNTER — Encounter: Payer: Medicare HMO | Admitting: Internal Medicine

## 2015-01-24 ENCOUNTER — Other Ambulatory Visit: Payer: Self-pay | Admitting: *Deleted

## 2015-01-24 DIAGNOSIS — F0280 Dementia in other diseases classified elsewhere without behavioral disturbance: Secondary | ICD-10-CM

## 2015-01-24 DIAGNOSIS — G309 Alzheimer's disease, unspecified: Principal | ICD-10-CM

## 2015-01-24 NOTE — Patient Outreach (Signed)
Call placed to member's daughter for transition of care.  As of 5/2, member has been out of hospital for 31 days.  Daughter states that the member had appointment with cardiologist and primary physician within the last 2 weeks.  She states that both herself and the member voiced concern about the member's uncontrolled pain in his back.  The daughter requested for the member to be referred to a pain clinic, and states that there is supposed to be a referral to the pain clinic and the neurosurgeon.  She states that the member finished a 6 day course of steroids for a possible gout flare up, but it has also worked for the [employer] back pain.  She states that this is the reason for the neurosurgeon consult.    Daughter states that the member has had some "better days this week" since taking the steroid.  She states that he has not complained of any discomfort and has been moving better.  Routine home visit rescheduled due to appointment at the coumadin clinic for the same time.    Daughter states that she does not have any questions or concerns at this time.  Made aware of new social worker consult for assistance with hearing aids and resources for Alzheimer's activities and programs.  [name], BSN, [facility]  [facility] Care Manager [phone]

## 2015-01-28 DIAGNOSIS — F0280 Dementia in other diseases classified elsewhere without behavioral disturbance: Secondary | ICD-10-CM | POA: Diagnosis not present

## 2015-01-28 DIAGNOSIS — G309 Alzheimer's disease, unspecified: Secondary | ICD-10-CM | POA: Diagnosis not present

## 2015-01-28 DIAGNOSIS — I2699 Other pulmonary embolism without acute cor pulmonale: Secondary | ICD-10-CM | POA: Diagnosis not present

## 2015-01-28 DIAGNOSIS — R269 Unspecified abnormalities of gait and mobility: Secondary | ICD-10-CM | POA: Diagnosis not present

## 2015-01-28 DIAGNOSIS — R001 Bradycardia, unspecified: Secondary | ICD-10-CM | POA: Diagnosis not present

## 2015-01-29 ENCOUNTER — Other Ambulatory Visit: Payer: Self-pay | Admitting: Internal Medicine

## 2015-01-29 NOTE — Addendum Note (Signed)
Addended by: Jerrye Noble on: 01/29/2015 01:24 PM   Modules accepted: Orders

## 2015-01-30 ENCOUNTER — Encounter: Payer: Self-pay | Admitting: Cardiology

## 2015-02-01 ENCOUNTER — Telehealth: Payer: Self-pay | Admitting: Internal Medicine

## 2015-02-01 NOTE — Telephone Encounter (Signed)
  Reason for call:   I placed an outgoing call to [name] [name] (daughter Lilli Light) at 5:48AM on 5/14.  She had called the after hours pager concerned about her father's BP.  She stated that she had taken it several times throughout the day yesterday and it got as high as systolic 062.  She reports he takes amlodipine 2.5mg  and lasix 20mg  daily in the AM.  She denies he has missed any doses.  Per chart review, his BP has been well controlled at clinic visits.  He does have chronic back pain which may also be contributing.  I also explained to her to be mindful of her father's sodium intake.  She states most readings over the past day have been in the range of 150s.  I told her I didn't think we should make any changes at this point and to continue to monitor him.     Assessment/ Plan:   Hypertension: Advised daughter that I would send a message to the front desk and he could likely be seen in clinic on Monday.  I requested that she bring her BP meter to compare with our readings as this could be a potential source for errors in BP measurements.  It may be that he needs to be on a 5mg  amlodipine instead of 2.5mg  or perhaps he could take 2.5mg  bid.  Advised to be mindful of sodium intake and attempt to control chronic pain.  She is agreeable to this plan.   As always, pt is advised that if symptoms worsen or new symptoms arise, they should go to an urgent care facility or to to ER for further evaluation.   Jones Bales, MD   02/01/2015, 5:48 AM

## 2015-02-03 ENCOUNTER — Telehealth: Payer: Self-pay | Admitting: *Deleted

## 2015-02-03 ENCOUNTER — Other Ambulatory Visit: Payer: Self-pay | Admitting: *Deleted

## 2015-02-03 ENCOUNTER — Ambulatory Visit (INDEPENDENT_AMBULATORY_CARE_PROVIDER_SITE_OTHER): Payer: Commercial Managed Care - HMO | Admitting: Internal Medicine

## 2015-02-03 ENCOUNTER — Encounter: Payer: Self-pay | Admitting: Internal Medicine

## 2015-02-03 VITALS — BP 125/89 | HR 66 | Temp 98.5°F | Ht 66.0 in | Wt 153.8 lb

## 2015-02-03 DIAGNOSIS — I10 Essential (primary) hypertension: Secondary | ICD-10-CM

## 2015-02-03 NOTE — Telephone Encounter (Signed)
-----   Message from Jones Bales, MD sent at 02/01/2015  5:55 AM EDT ----- Please schedule [name] for a BP check on Monday or at the latest on Tuesday.  His daughter called and was very concerned regarding high blood pressure readings.  Please remind her to bring in the BP meter so we can compare it with our readings.  Thanks!

## 2015-02-03 NOTE — Telephone Encounter (Signed)
Left message about appt in clinic on [phone] and  [phone] -  02/03/15 2:15PM Dr [name] per Dr Gordy Levan . Hilda Blades Magdalen Cabana RN 02/03/15 9AM

## 2015-02-03 NOTE — Patient Outreach (Signed)
[facility]) Care Management  02/03/2015  [name] [name] 04/01/1931 [id number]   Phone call to patient's daughter to discuss all inclusive day programs for patient.  Voice mail message left for a return call.    Sheralyn Boatman [facility] Care Management [phone]

## 2015-02-03 NOTE — Addendum Note (Signed)
Addended by: Hulan Fray on: 02/03/2015 07:05 PM   Modules accepted: Orders

## 2015-02-04 NOTE — Progress Notes (Signed)
   Subjective:    Patient ID: [name] [name], male    DOB: 07/20/1931, 81 y.o.   MRN: [id number]  HPI Pt is a 81 y/o male w/ PMHx of HTN, depression, DJD, hx of PE on coumadin, and CAD who presents to clinic for HTN. Pt's daughter checks his blood pressure daily with a cuff placed on his wrist. Blood pressure cuff readings were verified by a church member who checked it with a stethoscope and sphygmomanometer. On 5/14 pt had a BP reading of 175/110, on 5/15 pt had another elevated reading of 154/100. Daughter states pt was having rib pain during both of those times. She is concerned that his blood pressure meds she be uptitrated or restarted. Pt denies blurry vision or headache during this episodes.     Review of Systems  Constitutional: Negative for activity change.  Eyes: Negative for visual disturbance.  Respiratory: Negative for shortness of breath.   Cardiovascular: Negative for chest pain.  Musculoskeletal: Positive for back pain.  Neurological: Negative for headaches.       Objective:   Physical Exam  Constitutional: He appears well-developed and well-nourished. No distress.  HENT:  Head: Normocephalic.  Cardiovascular: Normal rate and regular rhythm.   Pulmonary/Chest: Effort normal and breath sounds normal.  Abdominal: Soft. Bowel sounds are normal. He exhibits no distension.  Musculoskeletal:  scoliosis with kyphosis of spine, cannot stand up completely straight but at his normal posture and flex back at hip, can bend forward at 90 deg angle, can rotate lt and rt at hip 30 deg, and 30 deg left lateral and rt lateral flexion. Non tender to palpation of spine, tender to palpation of iliac crest b/l  Skin: Skin is warm and dry.          Assessment & Plan:  Please see problem based assessment and plan.

## 2015-02-04 NOTE — Assessment & Plan Note (Signed)
BP Readings from Last 3 Encounters:  02/03/15 125/89  01/20/15 135/79  01/17/15 120/60    Lab Results  Component Value Date   NA 140 01/17/2015   K 3.7 01/17/2015   CREATININE 0.86 01/17/2015    Assessment: Blood pressure control:  controlled Progress toward BP goal:    at goal Comments: on norvasc 2.5 mg. Was previously admitted for syncope felt to be due to low BPs. Thus he was taken of lisinopril and norvasc 10mg  was reduced to 2.5mg . Daughter is worried about high BP readings at home up, highest reading was 175/110 on BP log they brought in. Pt was in pain at that time. On day of visit BP recorded at home was 107/66.   Plan: Medications:  continue current medications Educational resources provided: brochure Self management tools provided: home blood pressure logbook (has booklet) Other plans: will not increase BP meds at this time, instructed daughter to treat pt's pain with tylenol if she notices elevated BPs. If she notices continual increase in BP readings then to call clinic for appt, can increase norvasc to 5mg  at that time. Likely elevated readings due to pain.

## 2015-02-05 NOTE — Progress Notes (Signed)
Internal Medicine Clinic Attending  Case discussed with Dr. Truong at the time of the visit.  We reviewed the resident's history and exam and pertinent patient test results.  I agree with the assessment, diagnosis, and plan of care documented in the resident's note.  

## 2015-02-06 ENCOUNTER — Telehealth: Payer: Self-pay | Admitting: Pharmacist

## 2015-02-06 ENCOUNTER — Encounter: Payer: Self-pay | Admitting: Cardiology

## 2015-02-06 ENCOUNTER — Telehealth: Payer: Self-pay | Admitting: Internal Medicine

## 2015-02-06 NOTE — Telephone Encounter (Signed)
Call to patient to confirm appointment for 02/10/15 at 10:30 lmtcb

## 2015-02-06 NOTE — Telephone Encounter (Signed)
Spoke to daughter about fathers BP dropped down to 149/97, 132/87 then daughter stated when sat up when 158/100, 156/101. Lying down 177/99, 165/105, 164/86.  DBP was 110-120.  She has not taken it since hes gone upstairs. He feels nauseated when BP is high. He is not clammy,  His Blood pressure medication is Norvasc 2.5 mg, Lasix 20 mg qd.  Advised daughter to increase Norvasc to 5 mg and call the clinic tomorrow to discuss how BP is doing.    Aundra Dubin MD

## 2015-02-07 ENCOUNTER — Other Ambulatory Visit: Payer: Self-pay | Admitting: Internal Medicine

## 2015-02-07 ENCOUNTER — Telehealth: Payer: Self-pay | Admitting: *Deleted

## 2015-02-07 ENCOUNTER — Other Ambulatory Visit: Payer: Self-pay | Admitting: *Deleted

## 2015-02-07 DIAGNOSIS — M47817 Spondylosis without myelopathy or radiculopathy, lumbosacral region: Secondary | ICD-10-CM

## 2015-02-07 NOTE — Patient Outreach (Signed)
[facility]) Care Management  02/07/2015  [name] [name] 24-Jan-1931 [id number]   Phone call to patient's daughter to discuss referral to day programs for patient.  Per patient's daughter, patient is not looking for all inclusive care, however would like a day program that he could attend 1-2 times a week to increase socialization and recreation.  This social worker discussed the [facility] program as a possible option for respite and day care.  Information emailed to patient's daughter per her request.   Sheralyn Boatman [facility] Care Management [phone]'

## 2015-02-07 NOTE — Telephone Encounter (Signed)
Cont to monitor pt and BP today. Caution he may get dizzy / lightheaded. If BP remains 150/100, may take nl dose tomorrow.

## 2015-02-07 NOTE — Telephone Encounter (Signed)
Daughter informed and voices understanding.  Will call for any changes.

## 2015-02-07 NOTE — Telephone Encounter (Signed)
Call from Pt's daughter stating pt took overdose of his Amlodipine this morning.  His dose was recently decreased to 2.5 mg Last night he was told to increase dose to Amlodipine 5 mg per Dr Aundra Dubin and call clinic today.   Today she had the pills in a bottle and pt took before the correct number of pills were pulled out.  Pt took 3 1/2 pills of Amlodipine 2.5 mg ( 8.75 mg ) Today BP was 150/100 @ 11:00   He has been on amlodipine 10 mg about 1 month ago but had problem with syncope. Daughter in home and also a nurse with pt wife.  Please advise Pt # [id number]

## 2015-02-10 ENCOUNTER — Other Ambulatory Visit: Payer: Self-pay | Admitting: *Deleted

## 2015-02-10 ENCOUNTER — Ambulatory Visit (INDEPENDENT_AMBULATORY_CARE_PROVIDER_SITE_OTHER): Payer: Commercial Managed Care - HMO | Admitting: Pharmacist

## 2015-02-10 ENCOUNTER — Ambulatory Visit: Payer: Commercial Managed Care - HMO | Admitting: *Deleted

## 2015-02-10 DIAGNOSIS — I2699 Other pulmonary embolism without acute cor pulmonale: Secondary | ICD-10-CM | POA: Diagnosis not present

## 2015-02-10 DIAGNOSIS — Z7901 Long term (current) use of anticoagulants: Secondary | ICD-10-CM

## 2015-02-10 DIAGNOSIS — Z7902 Long term (current) use of antithrombotics/antiplatelets: Secondary | ICD-10-CM

## 2015-02-10 LAB — POCT INR: INR: 2.6

## 2015-02-10 NOTE — Patient Outreach (Signed)
[facility]) Care Management   02/10/2015  [name] [name] [birth year] [id number]  [name] [name] is an 81 y.o. male  Subjective:   Member states "I'm ok."  Reports that he does have some pain in his back at times, but reports his pain level at this time to be a 0.  Objective:   Review of Systems  Constitutional: Negative.   HENT: Negative.   Eyes: Negative.   Respiratory: Negative.   Cardiovascular: Negative.   Gastrointestinal: Negative.   Genitourinary: Negative.   Musculoskeletal: Negative.   Skin: Negative.   Neurological: Negative.   Endo/Heme/Allergies: Negative.   Psychiatric/Behavioral: Positive for memory loss.    Physical Exam  Constitutional: He is oriented to person, place, and time. He appears well-developed and well-nourished.  Neck: Normal range of motion.  Cardiovascular: Normal rate, regular rhythm and normal heart sounds.   Respiratory: Effort normal and breath sounds normal.  GI: Soft. Bowel sounds are normal.  Musculoskeletal: Normal range of motion.  Neurological: He is alert and oriented to person, place, and time.  Skin: Skin is warm and dry.   BP 128/76 mmHg  Pulse 68  Resp 16  Ht 1.676 m ($Remove'5\' 6"'luxmNcu$ )  Wt 153 lb (69.4 kg)  BMI 24.71 kg/m2  SpO2 96%  Current Medications:   Current Outpatient Prescriptions  Medication Sig Dispense Refill  . acetaminophen (TYLENOL) 325 MG tablet Take 2 tablets (650 mg total) by mouth every 4 (four) hours as needed for headache or mild pain.    [name] amLODipine (NORVASC) 2.5 MG tablet Take 1 tablet (2.5 mg total) by mouth daily. 30 tablet 6  . aspirin EC 81 MG EC tablet Take 1 tablet (81 mg total) by mouth daily.    [name] atorvastatin (LIPITOR) 40 MG tablet TAKE 1 TABLET (40 MG TOTAL) BY MOUTH EVERY EVENING. 90 tablet 1  . b complex vitamins tablet Take 1 tablet by mouth daily.    . Calcium Carbonate-Vitamin D (CALCIUM-VITAMIN D) 500-200 MG-UNIT per tablet Take 1 tablet by mouth daily.    .  carbidopa-levodopa (SINEMET IR) 25-100 MG per tablet Take 1 tablet by mouth 2 (two) times daily. 60 tablet 3  . fish oil-omega-3 fatty acids 1000 MG capsule Take 2 g by mouth daily.     . furosemide (LASIX) 20 MG tablet Take 1 tablet (20 mg total) by mouth daily. 90 tablet 3  . Multiple Vitamins-Minerals (MULTIVITAMIN WITH MINERALS) tablet Take 1 tablet by mouth daily.    [name] NAMENDA XR 28 MG CP24 Take 28 mg by mouth daily. 30 capsule 12  . omeprazole (PRILOSEC) 20 MG capsule TAKE 1 CAPSULE (20 MG TOTAL) BY MOUTH DAILY. 90 capsule 0  . warfarin (COUMADIN) 5 MG tablet Take 1&1/2 tablets on Mondays; Take 1 tablet all other days. 40 tablet 2  . predniSONE (STERAPRED UNI-PAK 21 TAB) 5 MG (21) TBPK tablet Take 1 tablet (5 mg total) by mouth daily. (Patient not taking: Reported on 02/10/2015) 21 tablet 0  . zoster vaccine live, PF, (ZOSTAVAX) 35701 UNT/0.65ML injection Inject 19,400 Units into the skin once. (Patient not taking: Reported on 02/10/2015) 1 each 0   No current facility-administered medications for this visit.    Functional Status:   In your present state of health, do you have any difficulty performing the following activities: 02/03/2015 01/20/2015  Hearing? N N  Vision? N N  Difficulty concentrating or making decisions? [name] [name]  Walking or climbing stairs? N Y  Dressing or bathing? N N  Doing errands, shopping? Y Y  Preparing Food and eating ? - -  Using the Toilet? - -  In the past six months, have you accidently leaked urine? - -  Do you have problems with loss of bowel control? - -  Managing your Medications? - -  Managing your Finances? - -  Housekeeping or managing your Housekeeping? - -    Fall/Depression Screening:    PHQ 2/9 Scores 02/03/2015 01/20/2015 01/13/2015 12/23/2014 10/09/2014 09/30/2014 04/18/2014  PHQ - 2 Score 0 0 4 0 0 1 0  PHQ- 9 Score - - 14 - - - -    Assessment:    Met with member at his daughter's home where he is currently living.  Daughter present during visit.   Daughter states that the member has been doing relatively well, She states that he has been working with a specialist for his chronic pain.  She states that they are looking into administering steroid injections, but is needing clearance due to the member being on Warfarin.  Member today does not complain of any pain.    Daughter states that the member has lost a few pounds since the last visit.  She reports that she has not had a chance to obtain a scale to monitor daily weights.  She states that she will do her best to obtain one within the next couple weeks.  Discussed the option of obtaining a scale from [facility] if she is unable to obtain one.  Advised that daily weights could be used for monitoring possible weight loss (Dementia/Alzheimer's - Appetite has decreased) and fluid levels (currently on Lasix).  Daughter verbalizes understanding.  Daughter does monitor blood pressure on a daily basis since the member's last admission for hypotension and syncope.  Member is no longer having problems with hypotension, and has now been having several recorded days where systolic blood pressure is greater than 150 and 160.  Daughter states that she has discussed this with the primary physician team (last week) and has been told to continue to monitor.  Encouraged to notify primary office again by the end of the week if the member continues to have blood pressures greater than 160.  Discussed various factors that have an influence on blood pressure, including pain levels.  Also discussed taking blood pressure and noting on record if the member had taken his medications prior to the recorded time.  Daughter verbalizes understanding.  Member and daughter both deny any further questions at this time.  Encouraged to contact this care manager with any concerns.    Plan:   Routine home visit scheduled for next month.  [facility] CM Care Plan Problem One        Patient Outreach from 02/10/2015 in [place] Problem One  Hypertension   Care Plan for Problem One  Active   THN CM Short Term Goal #1 (0-30 days)  Member will have blood pressure consistently within range witihn the next 4 weeks   THN CM Short Term Goal #1 Start Date  02/10/15   Interventions for Short Term Goal #1  Discussed contuing to monitor blood pressure and discussing options to control blood pressure with primary care office within the next week   THN CM Short Term Goal #2 (0-30 days)  Member/Caregiver will record blood pressure daily for the next 4 weeks   THN CM Short Term Goal #2 Start Date  01/13/15   [facility] CM Short Term Goal #2 Met  Date  02/10/15   Interventions for Short Term Goal #2  Discussed the importance of documenting daily blood pressure readings to better communicate with physician    [place] Problem Two        Patient Outreach from 02/10/2015 in [place] Problem Two  Fluid buildup   Care Plan for Problem Two  Active   THN CM Short Term Goal #1 (0-30 days)  Daughter will obtain a scale for weights within the next 2 weeks   THN CM Short Term Goal #1 Start Date  02/10/15 [Original goal date not met, start date rescheduled]   Interventions for Short Term Goal #2   Discussed the importance of having a working scale, especially since the member is now on Lasix for swelling   THN CM Short Term Goal #2 (0-30 days)  Member will have daily weights documented for the next 4 weeks   THN CM Short Term Goal #2 Start Date  02/10/15 [Original goal date not met, start date rescheduled]   Interventions for Short Term Goal #2  Discussed the importance of documenting daily weights to monitor fluid levels    [facility] CM Care Plan Problem Three        Patient Outreach Telephone from 02/07/2015 in [place] CM Short Term Goal #4 (0-30 days)  patient's daughter to contact the [place] to discuss patient participation in their respite and day program within 30  days   THN CM Short Term Goal #4 Start Date  02/07/15   Interventions for Short Term Goal #4  contact information given to patient's daughter for the adult enrichment program       [name], BSN, [place] Manager [phone]

## 2015-02-10 NOTE — Progress Notes (Signed)
CLINICAL PHARMACIST ANTICOAG NOTE [name] [name] is a 81 y.o. male who reports to the clinic for monitoring of warfarin treatment.    Indication: PE Duration: indefinite  Anticoagulation Clinic Visit History: Anticoagulation Episode Summary    Current INR goal 2.0-3.0  Next INR check 03/10/2015  INR from last check 2.6 (02/10/2015)  Weekly max dose   Target end date Indefinite  INR check location [facility]  Preferred lab   Send INR reminders to ANTICOAG IMP   Indications  Pulmonary embolism [I26.99] Encounter for long-term use of antiplatelets/antithrombotics [Z79.02]        Comments History of multiple venous embolic episodes. Will continue to annual re-evaluate continued need for warfarin weighing risks vs. benefits.       Anticoagulation Care Providers    Provider Role Specialty Phone number   Bertha Stakes, MD  Internal Medicine [phone]     ASSESSMENT Recent Results: Recent results are below, the most recent result is correlated with a dose of 37.5 mg per week: Lab Results  Component Value Date   INR 2.6 02/10/2015   INR 1.90 01/20/2015   INR 2.40 12/30/2014    INR today: Therapeutic  Anticoagulation Dosing: INR as of 02/10/2015 and Previous Dosing Information    INR Dt INR Goal [employer] Sun Mon Tue Wed Thu Fri Sat   02/10/2015 2.6 2.0-3.0 37.5 mg 5 mg 7.5 mg 5 mg 5 mg 5 mg 5 mg 5 mg    Anticoagulation Dose Instructions as of 02/10/2015      Total Sun Mon Tue Wed Thu Fri Sat   New Dose 37.5 mg 5 mg 7.5 mg 5 mg 5 mg 5 mg 5 mg 5 mg     (5 mg x 1)  (5 mg x 1.5)  (5 mg x 1)  (5 mg x 1)  (5 mg x 1)  (5 mg x 1)  (5 mg x 1)                           PLAN Weekly dose was unchanged.  Patient Instructions  Patient educated about medication as defined in this encounter and verbalized understanding by repeating back instructions provided.    Follow-up Return in 4 weeks (on 03/10/2015) for follow-up INR on 03/10/2015 at 10:30 AM.  [name] [name] PharmD Candidate  [name] [name] Clinical Pharmacist  15 minutes spent face-to-face with the patient during the encounter. 50% of time spent on education. 50% of time was spent on testing and assessment.

## 2015-02-10 NOTE — Patient Instructions (Signed)
Patient educated about medication as defined in this encounter and verbalized understanding by repeating back instructions provided.   

## 2015-02-11 ENCOUNTER — Telehealth: Payer: Self-pay | Admitting: Internal Medicine

## 2015-02-11 ENCOUNTER — Telehealth: Payer: Self-pay | Admitting: Cardiology

## 2015-02-11 NOTE — Telephone Encounter (Signed)
New message      Request for surgical clearance:  1. What type of surgery is being performed? Lumbar epidural steroid injection  When is this surgery scheduled? 02-18-15 Are there any medications that need to be held prior to surgery and how long? ptis on coumadin 2. Name of physician performing surgery?  Dr Gerilyn Pilgrim  3. What is your office phone and fax number?  Fax [phone]-----or make note in epic

## 2015-02-11 NOTE — Telephone Encounter (Signed)
New problem   Pt's daughter want to know if we having been receiving pt's transmissions. Please advise.

## 2015-02-11 NOTE — Telephone Encounter (Signed)
Will route this message to Dr Meda Coffee for further review and recommendation of coumadin clearance for Lumbar epidural steroid injection scheduled for 02/18/15 and follow-up by routing the clearance note to Dr Gerilyn Pilgrim with [facility] imaging or fax to office at [phone] attention Danielle or Dr Gerilyn Pilgrim.

## 2015-02-11 NOTE — Telephone Encounter (Signed)
Hi Kristin, Would you be able to help me with this? Thank you, Dorothy Spark

## 2015-02-11 NOTE — Telephone Encounter (Signed)
LMOVM informing pt daughter that transmission are being received.

## 2015-02-12 ENCOUNTER — Encounter: Payer: Commercial Managed Care - HMO | Admitting: Internal Medicine

## 2015-02-12 NOTE — Telephone Encounter (Signed)
Spoke with daughter, she is going to reschedule epidural for a later date.  Needs INR check day prior to procedure and we will be closed for Memorial Day holiday.  She states he has not come off warfarin since having the second clot (5 years ago) with the exception of a hospital stay when he was on heparin.   Per protocol a patient with repeat DVT would be bridged.  She will talk to her brother (a Software engineer for [employer]) about whether they will even get the spinal injection or not.  She would not be willing to do lovenox injections , but thinks her brother would.  They will call back when a decision is made

## 2015-02-13 ENCOUNTER — Other Ambulatory Visit: Payer: Self-pay

## 2015-02-13 ENCOUNTER — Emergency Department (HOSPITAL_COMMUNITY): Payer: Commercial Managed Care - HMO

## 2015-02-13 ENCOUNTER — Encounter (HOSPITAL_COMMUNITY): Payer: Self-pay | Admitting: Emergency Medicine

## 2015-02-13 ENCOUNTER — Emergency Department (HOSPITAL_COMMUNITY)
Admission: EM | Admit: 2015-02-13 | Discharge: 2015-02-13 | Disposition: A | Discharge status: Home / Self Care | Payer: Commercial Managed Care - HMO | Attending: Emergency Medicine | Admitting: Emergency Medicine

## 2015-02-13 DIAGNOSIS — Y9389 Activity, other specified: Secondary | ICD-10-CM | POA: Diagnosis not present

## 2015-02-13 DIAGNOSIS — Z79899 Other long term (current) drug therapy: Secondary | ICD-10-CM | POA: Diagnosis not present

## 2015-02-13 DIAGNOSIS — I10 Essential (primary) hypertension: Secondary | ICD-10-CM | POA: Insufficient documentation

## 2015-02-13 DIAGNOSIS — Z7901 Long term (current) use of anticoagulants: Secondary | ICD-10-CM | POA: Diagnosis not present

## 2015-02-13 DIAGNOSIS — Z86018 Personal history of other benign neoplasm: Secondary | ICD-10-CM | POA: Insufficient documentation

## 2015-02-13 DIAGNOSIS — M19071 Primary osteoarthritis, right ankle and foot: Secondary | ICD-10-CM | POA: Diagnosis not present

## 2015-02-13 DIAGNOSIS — M7989 Other specified soft tissue disorders: Secondary | ICD-10-CM | POA: Diagnosis not present

## 2015-02-13 DIAGNOSIS — Y998 Other external cause status: Secondary | ICD-10-CM | POA: Diagnosis not present

## 2015-02-13 DIAGNOSIS — Z9889 Other specified postprocedural states: Secondary | ICD-10-CM | POA: Insufficient documentation

## 2015-02-13 DIAGNOSIS — S99911A Unspecified injury of right ankle, initial encounter: Secondary | ICD-10-CM | POA: Diagnosis not present

## 2015-02-13 DIAGNOSIS — W1839XA Other fall on same level, initial encounter: Secondary | ICD-10-CM | POA: Diagnosis not present

## 2015-02-13 DIAGNOSIS — M25571 Pain in right ankle and joints of right foot: Secondary | ICD-10-CM

## 2015-02-13 DIAGNOSIS — E785 Hyperlipidemia, unspecified: Secondary | ICD-10-CM | POA: Diagnosis not present

## 2015-02-13 DIAGNOSIS — Z7982 Long term (current) use of aspirin: Secondary | ICD-10-CM | POA: Diagnosis not present

## 2015-02-13 DIAGNOSIS — Z8546 Personal history of malignant neoplasm of prostate: Secondary | ICD-10-CM | POA: Diagnosis not present

## 2015-02-13 DIAGNOSIS — I2510 Atherosclerotic heart disease of native coronary artery without angina pectoris: Secondary | ICD-10-CM | POA: Insufficient documentation

## 2015-02-13 DIAGNOSIS — M5032 Other cervical disc degeneration, mid-cervical region: Secondary | ICD-10-CM | POA: Diagnosis not present

## 2015-02-13 DIAGNOSIS — F0390 Unspecified dementia without behavioral disturbance: Secondary | ICD-10-CM | POA: Insufficient documentation

## 2015-02-13 DIAGNOSIS — R4182 Altered mental status, unspecified: Secondary | ICD-10-CM | POA: Diagnosis not present

## 2015-02-13 DIAGNOSIS — Y9289 Other specified places as the place of occurrence of the external cause: Secondary | ICD-10-CM | POA: Insufficient documentation

## 2015-02-13 DIAGNOSIS — Z86711 Personal history of pulmonary embolism: Secondary | ICD-10-CM | POA: Insufficient documentation

## 2015-02-13 DIAGNOSIS — T148 Other injury of unspecified body region: Secondary | ICD-10-CM | POA: Diagnosis not present

## 2015-02-13 DIAGNOSIS — F329 Major depressive disorder, single episode, unspecified: Secondary | ICD-10-CM | POA: Insufficient documentation

## 2015-02-13 DIAGNOSIS — R52 Pain, unspecified: Secondary | ICD-10-CM | POA: Diagnosis not present

## 2015-02-13 LAB — URINALYSIS, ROUTINE W REFLEX MICROSCOPIC
BILIRUBIN URINE: NEGATIVE
GLUCOSE, UA: NEGATIVE mg/dL
HGB URINE DIPSTICK: NEGATIVE
Ketones, ur: NEGATIVE mg/dL
LEUKOCYTES UA: NEGATIVE
NITRITE: NEGATIVE
Protein, ur: NEGATIVE mg/dL
Specific Gravity, Urine: 1.012 (ref 1.005–1.030)
UROBILINOGEN UA: 0.2 mg/dL (ref 0.0–1.0)
pH: 7 (ref 5.0–8.0)

## 2015-02-13 LAB — CBC WITH DIFFERENTIAL/PLATELET
BASOS ABS: 0 10*3/uL (ref 0.0–0.1)
Basophils Relative: 1 % (ref 0–1)
Eosinophils Absolute: 0.2 10*3/uL (ref 0.0–0.7)
Eosinophils Relative: 6 % — ABNORMAL HIGH (ref 0–5)
HCT: 35.8 % — ABNORMAL LOW (ref 39.0–52.0)
HEMOGLOBIN: 11.2 g/dL — AB (ref 13.0–17.0)
LYMPHS ABS: 0.9 10*3/uL (ref 0.7–4.0)
Lymphocytes Relative: 28 % (ref 12–46)
MCH: 29 pg (ref 26.0–34.0)
MCHC: 31.3 g/dL (ref 30.0–36.0)
MCV: 92.7 fL (ref 78.0–100.0)
Monocytes Absolute: 0.3 10*3/uL (ref 0.1–1.0)
Monocytes Relative: 10 % (ref 3–12)
NEUTROS ABS: 1.8 10*3/uL (ref 1.7–7.7)
NEUTROS PCT: 55 % (ref 43–77)
PLATELETS: 163 10*3/uL (ref 150–400)
RBC: 3.86 MIL/uL — ABNORMAL LOW (ref 4.22–5.81)
RDW: 15.6 % — AB (ref 11.5–15.5)
WBC: 3.2 10*3/uL — ABNORMAL LOW (ref 4.0–10.5)

## 2015-02-13 LAB — PROTIME-INR
INR: 2.74 — AB (ref 0.00–1.49)
Prothrombin Time: 28.6 seconds — ABNORMAL HIGH (ref 11.6–15.2)

## 2015-02-13 LAB — BASIC METABOLIC PANEL
ANION GAP: 11 (ref 5–15)
BUN: 10 mg/dL (ref 6–20)
CO2: 26 mmol/L (ref 22–32)
Calcium: 8.9 mg/dL (ref 8.9–10.3)
Chloride: 103 mmol/L (ref 101–111)
Creatinine, Ser: 1.01 mg/dL (ref 0.61–1.24)
GFR calc non Af Amer: >60 mL/min (ref >60)
Glucose, Bld: 93 mg/dL (ref 65–99)
Potassium: 3.6 mmol/L (ref 3.5–5.1)
Sodium: 140 mmol/L (ref 135–145)

## 2015-02-13 LAB — I-STAT TROPONIN, ED: TROPONIN I, POC: 0.01 ng/mL (ref 0.00–0.08)

## 2015-02-13 MED ORDER — HYDROCODONE-ACETAMINOPHEN 5-325 MG PO TABS: 1.0000 | ORAL_TABLET | Freq: Four times a day (QID) | ORAL | Status: DC | PRN

## 2015-02-13 NOTE — Discharge Instructions (Signed)
Arthralgia °Your caregiver has diagnosed you as suffering from an arthralgia. Arthralgia means there is pain in a joint. This can come from many reasons including: °· Bruising the joint which causes soreness (inflammation) in the joint. °· Wear and tear on the joints which occur as we grow older (osteoarthritis). °· Overusing the joint. °· Various forms of arthritis. °· Infections of the joint. °Regardless of the cause of pain in your joint, most of these different pains respond to anti-inflammatory drugs and rest. The exception to this is when a joint is infected, and these cases are treated with antibiotics, if it is a bacterial infection. °HOME CARE INSTRUCTIONS  °· Rest the injured area for as long as directed by your caregiver. Then slowly start using the joint as directed by your caregiver and as the pain allows. Crutches as directed may be useful if the ankles, knees or hips are involved. If the knee was splinted or casted, continue use and care as directed. If an stretchy or elastic wrapping bandage has been applied today, it should be removed and re-applied every 3 to 4 hours. It should not be applied tightly, but firmly enough to keep swelling down. Watch toes and feet for swelling, bluish discoloration, coldness, numbness or excessive pain. If any of these problems (symptoms) occur, remove the ace bandage and re-apply more loosely. If these symptoms persist, contact your caregiver or return to this location. °· For the first 24 hours, keep the injured extremity elevated on pillows while lying down. °· Apply ice for 15-20 minutes to the sore joint every couple hours while awake for the first half day. Then 03-04 times per day for the first 48 hours. Put the ice in a plastic bag and place a towel between the bag of ice and your skin. °· Wear any splinting, casting, elastic bandage applications, or slings as instructed. °· Only take over-the-counter or prescription medicines for pain, discomfort, or fever as  directed by your caregiver. Do not use aspirin immediately after the injury unless instructed by your physician. Aspirin can cause increased bleeding and bruising of the tissues. °· If you were given crutches, continue to use them as instructed and do not resume weight bearing on the sore joint until instructed. °Persistent pain and inability to use the sore joint as directed for more than 2 to 3 days are warning signs indicating that you should see a caregiver for a follow-up visit as soon as possible. Initially, a hairline fracture (break in bone) may not be evident on X-rays. Persistent pain and swelling indicate that further evaluation, non-weight bearing or use of the joint (use of crutches or slings as instructed), or further X-rays are indicated. X-rays may sometimes not show a small fracture until a week or 10 days later. Make a follow-up appointment with your own caregiver or one to whom we have referred you. A radiologist (specialist in reading X-rays) may read your X-rays. Make sure you know how you are to obtain your X-ray results. Do not assume everything is normal if you do not hear from us. °SEEK MEDICAL CARE IF: °Bruising, swelling, or pain increases. °SEEK IMMEDIATE MEDICAL CARE IF:  °· Your fingers or toes are numb or blue. °· The pain is not responding to medications and continues to stay the same or get worse. °· The pain in your joint becomes severe. °· You develop a fever over 102° F (38.9° C). °· It becomes impossible to move or use the joint. °MAKE SURE YOU:  °·   Understand these instructions.  Will watch your condition.  Will get help right away if you are not doing well or get worse. Document Released: 09/06/2005 Document Revised: 11/29/2011 Document Reviewed: 04/24/2008 [facility] Patient Information 2015 [place], [place]. This information is not intended to replace advice given to you by your health care provider. Make sure you discuss any questions you have with your health care  provider.  Ankle Pain Ankle pain is a common symptom. The bones, cartilage, tendons, and muscles of the ankle joint perform a lot of work each day. The ankle joint holds your body weight and allows you to move around. Ankle pain can occur on either side or back of 1 or both ankles. Ankle pain may be sharp and burning or dull and aching. There may be tenderness, stiffness, redness, or warmth around the ankle. The pain occurs more often when a person walks or puts pressure on the ankle. CAUSES  There are many reasons ankle pain can develop. It is important to work with your caregiver to identify the cause since many conditions can impact the bones, cartilage, muscles, and tendons. Causes for ankle pain include:  Injury, including a break (fracture), sprain, or strain often due to a fall, sports, or a high-impact activity.  Swelling (inflammation) of a tendon (tendonitis).  Achilles tendon rupture.  Ankle instability after repeated sprains and strains.  Poor foot alignment.  Pressure on a nerve (tarsal tunnel syndrome).  Arthritis in the ankle or the lining of the ankle.  Crystal formation in the ankle (gout or pseudogout). DIAGNOSIS  A diagnosis is based on your medical history, your symptoms, results of your physical exam, and results of diagnostic tests. Diagnostic tests may include X-ray exams or a computerized magnetic scan (magnetic resonance imaging, MRI). TREATMENT  Treatment will depend on the cause of your ankle pain and may include:  Keeping pressure off the ankle and limiting activities.  Using crutches or other walking support (a cane or brace).  Using rest, ice, compression, and elevation.  Participating in physical therapy or home exercises.  Wearing shoe inserts or special shoes.  Losing weight.  Taking medications to reduce pain or swelling or receiving an injection.  Undergoing surgery. HOME CARE INSTRUCTIONS   Only take over-the-counter or prescription  medicines for pain, discomfort, or fever as directed by your caregiver.  Put ice on the injured area.  Put ice in a plastic bag.  Place a towel between your skin and the bag.  Leave the ice on for 15-20 minutes at a time, 03-04 times a day.  Keep your leg raised (elevated) when possible to lessen swelling.  Avoid activities that cause ankle pain.  Follow specific exercises as directed by your caregiver.  Record how often you have ankle pain, the location of the pain, and what it feels like. This information may be helpful to you and your caregiver.  Ask your caregiver about returning to work or sports and whether you should drive.  Follow up with your caregiver for further examination, therapy, or testing as directed. SEEK MEDICAL CARE IF:   Pain or swelling continues or worsens beyond 1 week.  You have an oral temperature above 102 F (38.9 C).  You are feeling unwell or have chills.  You are having an increasingly difficult time with walking.  You have loss of sensation or other new symptoms.  You have questions or concerns. MAKE SURE YOU:   Understand these instructions.  Will watch your condition.  Will get help  right away if you are not doing well or get worse. Document Released: 02/24/2010 Document Revised: 11/29/2011 Document Reviewed: 02/24/2010 [facility] Patient Information 2015 [place], [place]. This information is not intended to replace advice given to you by your health care provider. Make sure you discuss any questions you have with your health care provider.

## 2015-02-13 NOTE — ED Provider Notes (Signed)
CSN: [id number]     Arrival date & time 02/13/15  1741 History   First MD Initiated Contact with Patient 02/13/15 1800     Chief Complaint  Patient presents with  . Altered Mental Status     (Consider location/radiation/quality/duration/timing/severity/associated sxs/prior Treatment) HPI Comments: Patient with past medical history of dementia, PE, hypertension, hyperlipidemia, and CAD presents to the emergency department with chief complaint of right ankle and foot pain. He is accompanied by his daughter, who provides the history. She states that the patient has been unwilling to bear weight on his right foot today. She reports that he had a mechanical fall 2 days ago. She denies any known head injury. She states that he is at or near baseline mental status. She reports that he has difficulty balancing, and that he has Parkinson's-like symptoms, but has never been diagnosed with Parkinson's. He is taking carbidopa-levodopa.  He also takes Coumadin secondary to prior PE.  Patient denies any focal pain, however he is unable to provide much assistance with history and ROS secondary to mental status/dementia.  Level V caveat applies.  The history is provided by the patient. No language interpreter was used.    Past Medical History  Diagnosis Date  . PE (pulmonary embolism)     unprovoked PE completed 6 months of warfarin, warfarin d/ced 02/12/2010, repeat PE 07/10/2010 after a long car ride and now on lifelong coumadin.  [name] Hypertension   . Pituitary microadenoma     incidental finding CT 12/2009  . Prostate cancer   . Depression   . Hyperlipidemia   . Coronary artery disease, non-occlusive     with history of MI; Cath 2008 w multivessel nonobstructive CAD  . Dementia    Past Surgical History  Procedure Laterality Date  . Prostatectomy    . Left heart catheterization with coronary angiogram N/A 12/10/2014    Procedure: LEFT HEART CATHETERIZATION WITH CORONARY ANGIOGRAM;  Surgeon: [name], MD;  Location: [facility] CATH LAB;  Service: Cardiovascular;  Laterality: N/A;  . Loop recorder implant N/A 12/16/2014    Procedure: LOOP RECORDER IMPLANT;  Surgeon: [name], MD;  Location: [facility] CATH LAB;  Service: Cardiovascular;  Laterality: N/A;   Family History  Problem Relation Age of Onset  . Hypertension Father     Passed away from cerebral hemorrhage at age of 63.  [name] Dementia Mother     Passed away  . Hypertension Child     4 adult children  . Cervical cancer Other   . Lung cancer Other   . Stroke Other   . Heart attack Neg Hx    History  Substance Use Topics  . Smoking status: Never Smoker   . Smokeless tobacco: Never Used  . Alcohol Use: No    Review of Systems  Constitutional: Negative for fever and chills.  Respiratory: Negative for shortness of breath.   Cardiovascular: Negative for chest pain.  Gastrointestinal: Negative for nausea, vomiting, diarrhea and constipation.  Genitourinary: Negative for dysuria.  Musculoskeletal: Positive for joint swelling and arthralgias.  All other systems reviewed and are negative.     Allergies  Aricept and Other  Home Medications   Prior to Admission medications   Medication Sig Start Date End Date Taking? Authorizing Provider  acetaminophen (TYLENOL) 325 MG tablet Take 2 tablets (650 mg total) by mouth every 4 (four) hours as needed for headache or mild pain. 12/17/14   Isaiah Serge, NP  amLODipine (NORVASC) 2.5 MG tablet  Take 1 tablet (2.5 mg total) by mouth daily. 12/17/14   Isaiah Serge, NP  aspirin EC 81 MG EC tablet Take 1 tablet (81 mg total) by mouth daily. 12/17/14   Isaiah Serge, NP  atorvastatin (LIPITOR) 40 MG tablet TAKE 1 TABLET (40 MG TOTAL) BY MOUTH EVERY EVENING. 11/26/14   Francesca Oman, DO  b complex vitamins tablet Take 1 tablet by mouth daily.    Historical Provider, MD  Calcium Carbonate-Vitamin D (CALCIUM-VITAMIN D) 500-200 MG-UNIT per tablet Take 1 tablet by mouth daily.    Historical  Provider, MD  carbidopa-levodopa (SINEMET IR) 25-100 MG per tablet Take 1 tablet by mouth 2 (two) times daily. 12/17/14   Isaiah Serge, NP  fish oil-omega-3 fatty acids 1000 MG capsule Take 2 g by mouth daily.     Historical Provider, MD  furosemide (LASIX) 20 MG tablet Take 1 tablet (20 mg total) by mouth daily. 01/07/15   Dorothy Spark, MD  Multiple Vitamins-Minerals (MULTIVITAMIN WITH MINERALS) tablet Take 1 tablet by mouth daily.    Historical Provider, MD  NAMENDA XR 28 MG CP24 Take 28 mg by mouth daily. 01/23/14   Philmore Pali, NP  omeprazole (PRILOSEC) 20 MG capsule TAKE 1 CAPSULE (20 MG TOTAL) BY MOUTH DAILY. 01/29/15   Francesca Oman, DO  predniSONE (STERAPRED UNI-PAK 21 TAB) 5 MG (21) TBPK tablet Take 1 tablet (5 mg total) by mouth daily. Patient not taking: Reported on 02/10/2015 01/17/15   Liliane Shi, PA-C  warfarin (COUMADIN) 5 MG tablet Take 1&1/2 tablets on Mondays; Take 1 tablet all other days. 01/20/15   Aldine Contes, MD  zoster vaccine live, PF, (ZOSTAVAX) 93818 UNT/0.65ML injection Inject 19,400 Units into the skin once. Patient not taking: Reported on 02/10/2015 12/23/14   Francesca Oman, DO   BP 159/93 mmHg  Pulse 67  Ht 5\' 8"  (1.727 m)  Wt 153 lb (69.4 kg)  BMI 23.27 kg/m2  SpO2 100% Physical Exam  Constitutional: He appears well-developed and well-nourished.  HENT:  Head: Normocephalic and atraumatic.  Eyes: Conjunctivae and EOM are normal. Pupils are equal, round, and reactive to light. Right eye exhibits no discharge. Left eye exhibits no discharge. No scleral icterus.  Neck: Normal range of motion. Neck supple. No JVD present.  Cardiovascular: Normal rate, regular rhythm and normal heart sounds.  Exam reveals no gallop and no friction rub.   No murmur heard. Pulmonary/Chest: Effort normal and breath sounds normal. No respiratory distress. He has no wheezes. He has no rales. He exhibits no tenderness.  Abdominal: Soft. He exhibits no distension and no mass. There is  no tenderness. There is no rebound and no guarding.  Musculoskeletal: Normal range of motion. He exhibits no edema or tenderness.  Right ankle is moderately swollen, no focal bony tenderness, no evidence of infection  Neurological: He is alert.  Demented  Skin: Skin is warm and dry.  Psychiatric: He has a normal mood and affect. His behavior is normal. Judgment and thought content normal.  Nursing note and vitals reviewed.   ED Course  Procedures (including critical care time) Results for orders placed or performed during the hospital encounter of 02/13/15  Protime-INR  Result Value Ref Range   Prothrombin Time 28.6 (H) 11.6 - 15.2 seconds   INR 2.74 (H) 0.00 - 1.49  CBC with Differential/Platelet  Result Value Ref Range   WBC 3.2 (L) 4.0 - 10.5 K/uL   RBC 3.86 (L) 4.22 -  5.81 MIL/uL   Hemoglobin 11.2 (L) 13.0 - 17.0 g/dL   HCT 35.8 (L) 39.0 - 52.0 %   MCV 92.7 78.0 - 100.0 fL   MCH 29.0 26.0 - 34.0 pg   MCHC 31.3 30.0 - 36.0 g/dL   RDW 15.6 (H) 11.5 - 15.5 %   Platelets 163 150 - 400 K/uL   Neutrophils Relative % 55 43 - 77 %   Neutro Abs 1.8 1.7 - 7.7 K/uL   Lymphocytes Relative 28 12 - 46 %   Lymphs Abs 0.9 0.7 - 4.0 K/uL   Monocytes Relative 10 3 - 12 %   Monocytes Absolute 0.3 0.1 - 1.0 K/uL   Eosinophils Relative 6 (H) 0 - 5 %   Eosinophils Absolute 0.2 0.0 - 0.7 K/uL   Basophils Relative 1 0 - 1 %   Basophils Absolute 0.0 0.0 - 0.1 K/uL  Basic metabolic panel  Result Value Ref Range   Sodium 140 135 - 145 mmol/L   Potassium 3.6 3.5 - 5.1 mmol/L   Chloride 103 101 - 111 mmol/L   CO2 26 22 - 32 mmol/L   Glucose, Bld 93 65 - 99 mg/dL   BUN 10 6 - 20 mg/dL   Creatinine, Ser 1.01 0.61 - 1.24 mg/dL   Calcium 8.9 8.9 - 10.3 mg/dL   GFR calc non Af Amer >60 >60 mL/min   GFR calc Af Amer >60 >60 mL/min   Anion gap 11 5 - 15  Urinalysis, Routine w reflex microscopic  Result Value Ref Range   Color, Urine YELLOW YELLOW   APPearance CLEAR CLEAR   Specific Gravity,  Urine 1.012 1.005 - 1.030   pH 7.0 5.0 - 8.0   Glucose, UA NEGATIVE NEGATIVE mg/dL   Hgb urine dipstick NEGATIVE NEGATIVE   Bilirubin Urine NEGATIVE NEGATIVE   Ketones, ur NEGATIVE NEGATIVE mg/dL   Protein, ur NEGATIVE NEGATIVE mg/dL   Urobilinogen, UA 0.2 0.0 - 1.0 mg/dL   Nitrite NEGATIVE NEGATIVE   Leukocytes, UA NEGATIVE NEGATIVE  I-stat troponin, ED  Result Value Ref Range   Troponin i, poc 0.01 0.00 - 0.08 ng/mL   Comment 3           Dg Ankle Complete Right  02/13/2015   CLINICAL DATA:  Altered mental status. Unknown injury to the ankle and foot. Swelling of the right lower leg and pain. Unable to describe injury.  EXAM: RIGHT ANKLE - COMPLETE 3+ VIEW  COMPARISON:  None.  FINDINGS: There is no evidence of fracture, dislocation, or joint effusion. There is no evidence of arthropathy or other focal bone abnormality. Soft tissues are unremarkable.  IMPRESSION: Negative.   Electronically Signed   By: Nolon Nations M.D.   On: 02/13/2015 19:14   Ct Head Wo Contrast  02/13/2015   CLINICAL DATA:  Worsening altered mental status.  EXAM: CT HEAD WITHOUT CONTRAST  CT CERVICAL SPINE WITHOUT CONTRAST  TECHNIQUE: Multidetector CT imaging of the head and cervical spine was performed following the standard protocol without intravenous contrast. Multiplanar CT image reconstructions of the cervical spine were also generated.  COMPARISON:  12/07/2014; 03/14/2013  FINDINGS: CT HEAD FINDINGS  Similar findings of mild likely age-appropriate atrophy with diffuse sulcal prominence. Grossly unchanged extensive periventricular hypodensities compatible with microvascular ischemic disease. The gray-white differentiation is otherwise well maintained without CT evidence of acute large territory infarct. No intraparenchymal or extra-axial mass or hemorrhage. Unchanged size and configuration of the ventricles and basilar cisterns. No midline shift. Intracranial  atherosclerosis. Limited visualization the paranasal  sinuses and mastoid air cells is normal. No air-fluid levels. Regional soft tissues appear normal. Post bilateral cataract surgery. No displaced calvarial fracture.  CT CERVICAL SPINE FINDINGS  C1 to the superior endplate of T3 is imaged.  There is straightening and slight reversal of the expected cervical lordosis with mild kyphosis centered about the C6-C7 articulation. There is minimal (approximately 3 mm) of anterolisthesis of C5 upon C6, likely degenerative in etiology. The bilateral facets are normally aligned. The dens is normally positioned between the lateral masses of C1. Mild-to-moderate atlantodental degenerative change. Normal atlantoaxial articulations.  No fracture or static subluxation of the cervical spine. Cervical vertebral body heights are preserved. Prevertebral soft tissues are normal.  There is partial ankylosis of the C3-C4 vertebral bodies with ankylosis of the left C2-C4 transverse facets.  Moderate DDD of C6-C7 with disc space height loss, endplate irregularity and sclerosis. There is partial ossification of the nuchal ligament posterior to the C5 and C6 spinous process ease. There is incomplete fusion involving the tip of the T1 spinous process.  No bulky cervical lymphadenopathy on this noncontrast examination. Normal noncontrast appearance of the thyroid gland. Limited visualization of lung apices is normal.  IMPRESSION: 1. Similar findings of atrophy and microvascular ischemic disease without acute intracranial process. 2. No fracture or static subluxation of the cervical spine. 3. Moderate DDD of C6-C7. 4. Partial ankylosis of the C3 and C4 vertebral bodies.   Electronically Signed   By: Sandi Mariscal M.D.   On: 02/13/2015 19:08   Ct Cervical Spine Wo Contrast  02/13/2015   CLINICAL DATA:  Worsening altered mental status.  EXAM: CT HEAD WITHOUT CONTRAST  CT CERVICAL SPINE WITHOUT CONTRAST  TECHNIQUE: Multidetector CT imaging of the head and cervical spine was performed following the  standard protocol without intravenous contrast. Multiplanar CT image reconstructions of the cervical spine were also generated.  COMPARISON:  12/07/2014; 03/14/2013  FINDINGS: CT HEAD FINDINGS  Similar findings of mild likely age-appropriate atrophy with diffuse sulcal prominence. Grossly unchanged extensive periventricular hypodensities compatible with microvascular ischemic disease. The gray-white differentiation is otherwise well maintained without CT evidence of acute large territory infarct. No intraparenchymal or extra-axial mass or hemorrhage. Unchanged size and configuration of the ventricles and basilar cisterns. No midline shift. Intracranial atherosclerosis. Limited visualization the paranasal sinuses and mastoid air cells is normal. No air-fluid levels. Regional soft tissues appear normal. Post bilateral cataract surgery. No displaced calvarial fracture.  CT CERVICAL SPINE FINDINGS  C1 to the superior endplate of T3 is imaged.  There is straightening and slight reversal of the expected cervical lordosis with mild kyphosis centered about the C6-C7 articulation. There is minimal (approximately 3 mm) of anterolisthesis of C5 upon C6, likely degenerative in etiology. The bilateral facets are normally aligned. The dens is normally positioned between the lateral masses of C1. Mild-to-moderate atlantodental degenerative change. Normal atlantoaxial articulations.  No fracture or static subluxation of the cervical spine. Cervical vertebral body heights are preserved. Prevertebral soft tissues are normal.  There is partial ankylosis of the C3-C4 vertebral bodies with ankylosis of the left C2-C4 transverse facets.  Moderate DDD of C6-C7 with disc space height loss, endplate irregularity and sclerosis. There is partial ossification of the nuchal ligament posterior to the C5 and C6 spinous process ease. There is incomplete fusion involving the tip of the T1 spinous process.  No bulky cervical lymphadenopathy on this  noncontrast examination. Normal noncontrast appearance of the thyroid gland. Limited visualization of  lung apices is normal.  IMPRESSION: 1. Similar findings of atrophy and microvascular ischemic disease without acute intracranial process. 2. No fracture or static subluxation of the cervical spine. 3. Moderate DDD of C6-C7. 4. Partial ankylosis of the C3 and C4 vertebral bodies.   Electronically Signed   By: Sandi Mariscal M.D.   On: 02/13/2015 19:08   Dg Foot Complete Right  02/13/2015   CLINICAL DATA:  Right foot pain and swelling.  Initial encounter.  EXAM: RIGHT FOOT COMPLETE - 3+ VIEW  COMPARISON:  None.  FINDINGS: There is no evidence of fracture, subluxation or dislocation.  Mild degenerative changes of the first MTP joint noted.  No focal bony lesions are present.  IMPRESSION: No acute abnormalities.  Mild degenerative changes of the first MTP joint.   Electronically Signed   By: Margarette Canada M.D.   On: 02/13/2015 19:15      EKG Interpretation None      MDM   Final diagnoses:  Ankle pain, right    Patient with dementia and right foot pain. Also had a mechanical fall a couple days ago. Check labs, imaging, and will reassess.  Imaging and labs are reassuring.  Suspect ankle sprain.   Recently had a gout flare, but the ankle is not hot or red.  I suspect this is more of a sprain 2/2 to the fall a couple of days ago.  Patient seen by and discussed with Dr. Johnney Killian, who agrees with the plan.  Daughter understands and agrees with the plan.    Montine Circle, PA-C 02/13/15 2006  Charlesetta Shanks, MD 02/14/15 [phone]

## 2015-02-13 NOTE — ED Notes (Signed)
Per EMS called out to patient's house by daughter due to increased altered mental status. Patient's baseline per daughter is altered.  CBG 97

## 2015-02-14 ENCOUNTER — Ambulatory Visit (INDEPENDENT_AMBULATORY_CARE_PROVIDER_SITE_OTHER): Payer: Commercial Managed Care - HMO | Admitting: *Deleted

## 2015-02-14 ENCOUNTER — Other Ambulatory Visit: Payer: Self-pay | Admitting: *Deleted

## 2015-02-14 DIAGNOSIS — R55 Syncope and collapse: Secondary | ICD-10-CM | POA: Diagnosis not present

## 2015-02-14 NOTE — Patient Outreach (Signed)
[facility]) Care Management  02/14/2015  Vernel Langenderfer 1930/10/11 [id number]  Phone call to patient's daughter Lilli Light to follow up on referrals emailed to her regarding the [facility].  No answer-voice mail message left for a return call.   Sheralyn Boatman [facility] Care Management [phone]

## 2015-02-14 NOTE — Patient Outreach (Signed)
Call placed to member's daughter, Lilli Light, to follow up on emergency department visit.  No answer, HIPPA compliant voice message left.  Will await call back, if no call back, will place another call next week.  [name], BSN, [facility]  [facility] Care Manager [phone]

## 2015-02-18 ENCOUNTER — Other Ambulatory Visit: Payer: Commercial Managed Care - HMO

## 2015-02-18 LAB — CUP PACEART REMOTE DEVICE CHECK
Date Time Interrogation Session: 20160417203045
Zone Setting Detection Interval: 2000 ms
Zone Setting Detection Interval: 3000 ms
Zone Setting Detection Interval: 400 ms

## 2015-02-20 ENCOUNTER — Encounter: Payer: Self-pay | Admitting: *Deleted

## 2015-02-21 ENCOUNTER — Ambulatory Visit (INDEPENDENT_AMBULATORY_CARE_PROVIDER_SITE_OTHER): Payer: Commercial Managed Care - HMO | Admitting: Cardiology

## 2015-02-21 ENCOUNTER — Encounter: Payer: Self-pay | Admitting: Cardiology

## 2015-02-21 VITALS — BP 142/72 | HR 72 | Ht 68.0 in | Wt 153.0 lb

## 2015-02-21 DIAGNOSIS — I255 Ischemic cardiomyopathy: Secondary | ICD-10-CM | POA: Diagnosis not present

## 2015-02-21 DIAGNOSIS — R600 Localized edema: Secondary | ICD-10-CM

## 2015-02-21 DIAGNOSIS — E785 Hyperlipidemia, unspecified: Secondary | ICD-10-CM

## 2015-02-21 DIAGNOSIS — I10 Essential (primary) hypertension: Secondary | ICD-10-CM

## 2015-02-21 DIAGNOSIS — I5023 Acute on chronic systolic (congestive) heart failure: Secondary | ICD-10-CM | POA: Diagnosis not present

## 2015-02-21 DIAGNOSIS — R001 Bradycardia, unspecified: Secondary | ICD-10-CM | POA: Diagnosis not present

## 2015-02-21 MED ORDER — FUROSEMIDE 20 MG PO TABS: 20.0000 mg | ORAL_TABLET | Freq: Two times a day (BID) | ORAL | Status: DC

## 2015-02-21 NOTE — Patient Instructions (Signed)
Medication Instructions:    INCREASE YOUR LASIX TO 20 MG TWICE DAILY---TAKE ONE IN THE MORNING AND ONE TABLET IN THE EVENING      Follow-Up:   6 WEEKS WITH SCOTT WEAVER PA-C

## 2015-02-21 NOTE — Progress Notes (Signed)
Patient ID: [name] [name], male   DOB: November 13, 1930, 81 y.o.   MRN: [id number]    Cardiology Office Note   Date:  02/21/2015   ID:  [name] [name], DOB Aug 19, 1931, MRN [id number]  PCP:  [name], DO  Cardiologist:  Dr. Ena Dawley   Electrophysiologist:  Dr. [name]   Chief complain: LE edema, insomnia   History of Present Illness: [name] [name] is a 81 y.o. male with a hx of nonobstructive CAD by cardiac catheterization in 2008, bradycardia, prior recurrent pulmonary embolism on chronic Coumadin, HTN, prostate CA, HL, depression, dementia.    Admitted 3/19-3/30 with syncope. Patient was complaining of chest pain prior to his episode of syncope. Blood pressure was 70/40 by EMS with heart rate in the 40s. He was placed on transcutaneous pacing. He was noted have underlying sinus bradycardia and frequent PVCs followed by compensatory pauses.  He was maintained on epinephrine and then dopamine. Cardiac enzymes were normal. Cardiac catheterization was obtained and demonstrated nonobstructive CAD. 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 He was placed on calcium channel blocker therapy. Coumadin was resumed. Echocardiogram demonstrated EF 40-45%. He was seen by Dr. [name] with electrophysiology.  Underlying conduction system disease was noted with RBBB, first-degree AV block and prior symptomatic bradycardia noted in May 2014 that improved off of Aricept. Ultimately decision was made to place a loop recorder instead of pacemaker. He had significant weakness with inability to stand. SNF placement was offered but the patient and his family refused. After arriving home, he did have trouble getting in and out of bed and home hospital bed was ordered.  Patient is followed by home health nursing. Phone call was made to the office on 4/18 due to lower extremity edema. Low-dose Lasix was added. The patient is also followed by [facility] care management.   He returns for FU.   He is staying with his  daughter. She is here with him today. He remains weak. Over the last couple of days he has started to notice pain in his right great toe. He also has significant low back pain and would like a referral to the pain clinic. He denies chest pain. He denies significant dyspnea. He denies recurrent syncope or near-syncope. He denies orthopnea, PND or edema. Appetite is good.  02/21/2015 - the patient is coming after 6 weeks, he has difficulties sleeping and sits a lot at night. He has developed LE edema and some SOB. Occassional CP. No palpitations, dizziness or syncope. He is complaint with his meds, denies orthopnea or PND.   Studies/Reports Reviewed Today:  LHC 12/10/14 Left main: Large caliber normal long vessel which bifurcated into an LAD and left circumflex vessel.  LAD:  Diagonal prox 40-50% (small).   LAD after Dx 50%; mid LAD with significant mid systolic bridging with narrowing up to 80% during systole that improved with IC nitroglycerin and with diastole 20% narrowed. Left circumflex: Small tortuous vessel which gave rise to 2 OM branches.  Right coronary artery: Large dominant normal vessel that supplied the PDA and PLA vessel. Left ventriculography:  EF 50-55%.  There was ectopy without definitive wall motion abnormalities.  Echo 12/09/14 - EF 40% to 45%.  Regional wall motion abnormalities cannot be excluded. Grade 1 diastolic dysfunction.   Past Medical History  Diagnosis Date  . PE (pulmonary embolism)     unprovoked PE completed 6 months of warfarin, warfarin d/ced 02/12/2010, repeat PE 07/10/2010 after a long car ride and now on lifelong coumadin.  [name] [name]  Hypertension   . Pituitary microadenoma     incidental finding CT 12/2009  . Prostate cancer   . Depression   . Hyperlipidemia   . Coronary artery disease, non-occlusive     with history of MI; Cath 2008 w multivessel nonobstructive CAD  . Dementia     Past Surgical History  Procedure Laterality Date  . Prostatectomy    .  Left heart catheterization with coronary angiogram N/A 12/10/2014    Procedure: LEFT HEART CATHETERIZATION WITH CORONARY ANGIOGRAM;  Surgeon: [name], MD;  Location: [facility] CATH LAB;  Service: Cardiovascular;  Laterality: N/A;  . Loop recorder implant N/A 12/16/2014    Procedure: LOOP RECORDER IMPLANT;  Surgeon: [name], MD;  Location: [facility] CATH LAB;  Service: Cardiovascular;  Laterality: N/A;     Current Outpatient Prescriptions  Medication Sig Dispense Refill  . acetaminophen (TYLENOL) 325 MG tablet Take 2 tablets (650 mg total) by mouth every 4 (four) hours as needed for headache or mild pain.    [name] [name] amLODipine (NORVASC) 2.5 MG tablet Take 1 tablet (2.5 mg total) by mouth daily. 30 tablet 6  . aspirin EC 81 MG EC tablet Take 1 tablet (81 mg total) by mouth daily.    [name] [name] atorvastatin (LIPITOR) 40 MG tablet TAKE 1 TABLET (40 MG TOTAL) BY MOUTH EVERY EVENING. 90 tablet 1  . b complex vitamins tablet Take 1 tablet by mouth daily.    . Calcium Carbonate-Vitamin D (CALCIUM-VITAMIN D) 500-200 MG-UNIT per tablet Take 1 tablet by mouth daily.    . carbidopa-levodopa (SINEMET IR) 25-100 MG per tablet Take 1 tablet by mouth 2 (two) times daily. 60 tablet 3  . fish oil-omega-3 fatty acids 1000 MG capsule Take 2 g by mouth daily.     . furosemide (LASIX) 20 MG tablet Take 1 tablet (20 mg total) by mouth daily. 90 tablet 3  . Multiple Vitamins-Minerals (MULTIVITAMIN WITH MINERALS) tablet Take 1 tablet by mouth daily.    [name] [name] NAMENDA XR 28 MG CP24 Take 28 mg by mouth daily. 30 capsule 12  . omeprazole (PRILOSEC) 20 MG capsule TAKE 1 CAPSULE (20 MG TOTAL) BY MOUTH DAILY. 90 capsule 0  . warfarin (COUMADIN) 5 MG tablet Take 1&1/2 tablets on Mondays; Take 1 tablet all other days. (Patient taking differently: Take 5-7.5 mg by mouth daily. Take 1&1/2 tablets on Mondays; Take 1 tablet all other days.) 40 tablet 2  . zoster vaccine live, PF, (ZOSTAVAX) 29562 UNT/0.65ML injection Inject 19,400 Units into the skin  once. 1 each 0   No current facility-administered medications for this visit.    Allergies:   Aricept and Other    Social History:  The patient  reports that he has never smoked. He has never used smokeless tobacco. He reports that he does not drink alcohol or use illicit drugs.   Family History:  The patient's family history includes Cervical cancer in his other; Dementia in his mother; Hypertension in his child and father; Lung cancer in his other; Stroke in his other. There is no history of Heart attack.    ROS:   Please see the history of present illness.   Review of Systems  Cardiovascular: Positive for leg swelling.  Musculoskeletal: Positive for back pain.  Psychiatric/Behavioral: The patient is nervous/anxious.   All other systems reviewed and are negative.     PHYSICAL EXAM: VS:  BP 142/72 mmHg  Pulse 72  Ht 5\' 8"  (1.727 m)  Wt 153 lb (69.4 kg)  BMI 23.27 kg/m2    Wt Readings from Last 3 Encounters:  02/21/15 153 lb (69.4 kg)  02/13/15 153 lb (69.4 kg)  02/10/15 153 lb (69.4 kg)     GEN: Well nourished, well developed, in no acute distress HEENT: normal Neck: no JVD, no masses Cardiac:  Normal S1/S2, RRR; no murmur ,  no rubs or gallops, no edema  Respiratory:  clear to auscultation bilaterally, no wheezing, B/L rales at bases. GI: soft, nontender, nondistended, + BS MS: no deformity or atrophy; first toe MTPJ slightly erythematous and tender to palpation Skin: warm and dry  Neuro:  CNs II-XII intact, Strength and sensation are intact Psych: Normal affect   EKG:  EKG is ordered today.  It demonstrates:   NSR, HR 71, LAD, RBBB   Recent Labs: 12/07/2014: ALT 7; B Natriuretic Peptide 90.9 12/18/2014: TSH 0.504 02/13/2015: BUN 10; Creatinine 1.01; Hemoglobin 11.2*; Platelets 163; Potassium 3.6; Sodium 140    Lipid Panel    Component Value Date/Time   CHOL 207* 06/30/2012 1153   TRIG 129 06/30/2012 1153   HDL 56 06/30/2012 1153   CHOLHDL 3.7 06/30/2012  1153   VLDL 26 06/30/2012 1153   LDLCALC 125* 06/30/2012 1153      ASSESSMENT AND PLAN:  Acute on chronic systolic CHF -  we will increase lasix to 20 mg po in the am and 20 mg po in the afternoon  Syncope, unspecified syncope type ILR was interrogated at wound check in the last 2 weeks. No significant arrhythmias were noted at that time. The patient has not had any recurrent syncope. There is a strong suspicion for AV block causing his syncope. He is not currently driving.  Symptomatic bradycardia He is not on AV nodal blocking agents. His heart rate is currently controlled.  Recurrent pulmonary embolism Coumadin is managed by primary care.  Coronary artery disease involving native coronary artery of native heart without angina pectoris No angina. 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 Continue aspirin, statin.  No beta blocker given history of bradycardia.  HLD (hyperlipidemia) Continue statin.  Essential hypertension, benign  Controlled.  Cardiomyopathy EF 40-45% by recent echocardiogram. He is not on beta blocker due to history of bradycardia. He was placed on amlodipine given intramyocardial bridging. Given his frailty, I am hesitant to add another medication to his regimen that would lower his blood pressure. We can certainly consider adding ACE inhibitor in the future. LE edema is much improved on Lasix. Check basic metabolic panel today.  Alzheimer's dementia Follow-up with neurology as planned.   Disposition:   FU with Richardson Dopp in 6 weeks.  Dorothy Spark, MD, [facility] 02/21/2015

## 2015-02-25 ENCOUNTER — Encounter: Payer: Self-pay | Admitting: Internal Medicine

## 2015-02-25 ENCOUNTER — Ambulatory Visit (INDEPENDENT_AMBULATORY_CARE_PROVIDER_SITE_OTHER): Payer: Commercial Managed Care - HMO | Admitting: Internal Medicine

## 2015-02-25 VITALS — BP 111/80 | HR 82 | Ht 68.0 in | Wt 161.2 lb

## 2015-02-25 DIAGNOSIS — R55 Syncope and collapse: Secondary | ICD-10-CM

## 2015-02-25 DIAGNOSIS — R001 Bradycardia, unspecified: Secondary | ICD-10-CM | POA: Diagnosis not present

## 2015-02-25 DIAGNOSIS — Z4509 Encounter for adjustment and management of other cardiac device: Secondary | ICD-10-CM

## 2015-02-25 LAB — CUP PACEART INCLINIC DEVICE CHECK
Date Time Interrogation Session: 20160607160341
MDC IDC SET ZONE DETECTION INTERVAL: 2000 ms
Zone Setting Detection Interval: 3000 ms
Zone Setting Detection Interval: 400 ms

## 2015-02-25 LAB — CUP PACEART REMOTE DEVICE CHECK: MDC IDC SESS DTM: 20160607113424

## 2015-02-25 NOTE — Patient Instructions (Signed)
Medication Instructions:  Your physician recommends that you continue on your current medications as directed. Please refer to the Current Medication list given to you today.  Labwork: None ordered  Testing/Procedures: None ordered  Follow-Up: Your physician wants you to follow-up in: 1 year with Dr. [name].  You will receive a reminder letter in the mail two months in advance. If you don't receive a letter, please call our office to schedule the follow-up appointment.   Any Other Special Instructions Will Be Listed Below (If Applicable).

## 2015-02-25 NOTE — Progress Notes (Signed)
Loop recorder 

## 2015-02-25 NOTE — Progress Notes (Signed)
Electrophysiology Office Note   Date:  02/25/2015   ID:  [name] [name], DOB 1931/02/16, MRN [id number]  PCP:  [name], DO  Cardiologist:   Primary Electrophysiologist:   [name], MD    Chief Complaint  Patient presents with  . Follow-up    loop recorder     History of Present Illness: [name] [name] is a 81 y.o. male     seen in follow-up for syncope and some degrees of bradycardia. After lengthy discussions, not withstanding underlying conduction system disease and identification frequent PVCs as potential explanation for his "bradycardia", it was elected to put in an event recorder.   Today, he denies  symptoms of  chest pain, shortness of breath, orthopnea, PND, lower extremity edema, bleeding, or neurologic sequela.  he has n  complaints of palpitations, lightheadedness presyncope or syncope  The patient is tolerating medications without difficulties and is otherwise without complaint today.    Past Medical History  Diagnosis Date  . PE (pulmonary embolism)     unprovoked PE completed 6 months of warfarin, warfarin d/ced 02/12/2010, repeat PE 07/10/2010 after a long car ride and now on lifelong coumadin.  [name] Hypertension   . Pituitary microadenoma     incidental finding CT 12/2009  . Prostate cancer   . Depression   . Hyperlipidemia   . Coronary artery disease, non-occlusive     with history of MI; Cath 2008 w multivessel nonobstructive CAD  . Dementia    Past Surgical History  Procedure Laterality Date  . Prostatectomy    . Left heart catheterization with coronary angiogram N/A 12/10/2014    Procedure: LEFT HEART CATHETERIZATION WITH CORONARY ANGIOGRAM;  Surgeon: [name], MD;  Location: [facility] CATH LAB;  Service: Cardiovascular;  Laterality: N/A;  . Loop recorder implant N/A 12/16/2014    Procedure: LOOP RECORDER IMPLANT;  Surgeon: [name], MD;  Location: [facility] CATH LAB;  Service: Cardiovascular;  Laterality: N/A;     Current Outpatient  Prescriptions  Medication Sig Dispense Refill  . acetaminophen (TYLENOL) 325 MG tablet Take 2 tablets (650 mg total) by mouth every 4 (four) hours as needed for headache or mild pain.    [name] amLODipine (NORVASC) 2.5 MG tablet Take 1 tablet (2.5 mg total) by mouth daily. 30 tablet 6  . aspirin EC 81 MG EC tablet Take 1 tablet (81 mg total) by mouth daily.    [name] atorvastatin (LIPITOR) 40 MG tablet TAKE 1 TABLET (40 MG TOTAL) BY MOUTH EVERY EVENING. 90 tablet 1  . b complex vitamins tablet Take 1 tablet by mouth daily.    . Calcium Carbonate-Vitamin D (CALCIUM-VITAMIN D) 500-200 MG-UNIT per tablet Take 1 tablet by mouth daily.    . carbidopa-levodopa (SINEMET IR) 25-100 MG per tablet Take 1 tablet by mouth 2 (two) times daily. 60 tablet 3  . fish oil-omega-3 fatty acids 1000 MG capsule Take 2 g by mouth daily.     . furosemide (LASIX) 20 MG tablet Take 1 tablet (20 mg total) by mouth 2 (two) times daily. 180 tablet 3  . Multiple Vitamins-Minerals (MULTIVITAMIN WITH MINERALS) tablet Take 1 tablet by mouth daily.    [name] NAMENDA XR 28 MG CP24 Take 28 mg by mouth daily. 30 capsule 12  . omeprazole (PRILOSEC) 20 MG capsule TAKE 1 CAPSULE (20 MG TOTAL) BY MOUTH DAILY. 90 capsule 0  . warfarin (COUMADIN) 5 MG tablet Take 1&1/2 tablets on Mondays; Take 1 tablet all other days. (Patient taking differently:  Take 5-7.5 mg by mouth daily. Take 1&1/2 tablets on Mondays; Take 1 tablet all other days.) 40 tablet 2  . zoster vaccine live, PF, (ZOSTAVAX) 53664 UNT/0.65ML injection Inject 19,400 Units into the skin once. 1 each 0   No current facility-administered medications for this visit.    Allergies:   Aricept and Other   Social History:  The patient  reports that he has never smoked. He has never used smokeless tobacco. He reports that he does not drink alcohol or use illicit drugs.   Family History:  The patient's    family history includes Cervical cancer in his other; Dementia in his mother; Hypertension in  his child and father; Lung cancer in his other; Stroke in his other. There is no history of Heart attack.    ROS:  Please see the history of present illness.   Otherwise, review of systems is negative *.    PHYSICAL EXAM: VS:  BP 111/80 mmHg  Pulse 82  Ht 5\' 8"  (1.727 m)  Wt 161 lb 3.2 oz (73.12 kg)  BMI 24.52 kg/m2 , BMI Body mass index is 24.52 kg/(m^2). GEN: Well nourished, well developed, in no acute distress HEENT: normal Neck: JVD fla  carotid bruits, or masses Cardiac:  *R RR; 2/6  murmur  rubs, +S4  Respiratory:  clear to auscultation bilaterally, normal work of breathing Back without kyphosis or CVAT GI: soft, nontender, nondistended, + BS MS: no deformity or atrophy Skin: warm and dry,   Extremities No Clubbing cyanosis  Edema Neuro:  Strength and sensation are intact Psych: euthymic mood, full affect  EKG:  EKG is not ordered today.     Recent Labs: 12/07/2014: ALT 7; B Natriuretic Peptide 90.9 12/18/2014: TSH 0.504 02/13/2015: BUN 10; Creatinine 1.01; Hemoglobin 11.2*; Platelets 163; Potassium 3.6; Sodium 140    Lipid Panel     Component Value Date/Time   CHOL 207* 06/30/2012 1153   TRIG 129 06/30/2012 1153   HDL 56 06/30/2012 1153   CHOLHDL 3.7 06/30/2012 1153   VLDL 26 06/30/2012 1153   LDLCALC 125* 06/30/2012 1153     Wt Readings from Last 3 Encounters:  02/25/15 161 lb 3.2 oz (73.12 kg)  02/21/15 153 lb (69.4 kg)  02/13/15 153 lb (69.4 kg)      Other studies Reviewed: Additional studies/ records that were reviewed today include LHC 12/10/14 Left main: Large caliber normal long vessel which bifurcated into an LAD and left circumflex vessel.  LAD: Diagonal prox 40-50% (small). LAD after Dx 50%; mid LAD with significant mid systolic bridging with narrowing up to 80% during systole that improved with IC nitroglycerin and with diastole 20% narrowed. Left circumflex: Small tortuous vessel which gave rise to 2 OM branches.  Right coronary artery:  Large dominant normal vessel that supplied the PDA and PLA vessel. Left ventriculography: EF 50-55%. There was ectopy without definitive wall motion abnormalities.  Echo 12/09/14 - EF 40% to 45%. [facility] 12/10/14 Left main: Large caliber normal long vessel which bifurcated into an LAD and left circumflex vessel.  LAD: Diagonal prox 40-50% (small). LAD after Dx 50%; mid LAD with significant mid systolic bridging with narrowing up to 80% during systole that improved with IC nitroglycerin and with diastole 20% narrowed. Left circumflex: Small tortuous vessel which gave rise to 2 OM branches.  Right coronary artery: Large dominant normal vessel that supplied the PDA and PLA vessel. Left ventriculography: EF 50-55%. There was ectopy without definitive wall motion abnormalities.  ASSESSMENT AND PLAN:   1. Syncope with symptomatic bradycardia  .  2. CAD      3. Recurrent PE  cath, INR subtherapeutic this morning. Dosing per pharmacy  4. HTN Stable No change required today  No recurrent syncope Some weakness with elevated blood pressure   Current medicines are reviewed at length with the patient today.   The patient does not have concerns regarding his medicines.  The following changes were made today:  none  Labs/ tests ordered today include:    No orders of the defined types were placed in this encounter.     Disposition:   FU with me  3 month(s)   Signed, [name], MD  02/25/2015 3:08 PM     [place] [place] [address] [phone] (office) [phone] (fax)

## 2015-02-27 ENCOUNTER — Other Ambulatory Visit: Payer: Self-pay | Admitting: *Deleted

## 2015-02-27 NOTE — Patient Outreach (Signed)
[place] [facility]) Care Management  02/27/2015  [name] [name] 07-17-1931 [id number]   Follow up call to patient's daughter [name] [name] regarding receipt of the information for the [place] for day and respite care for patient.  Daughter states that she has received the information, however has not made any arrangements yet.  She is currently working with [employer] in attempts to apply for a veterans pension.  She has an appointment with [facility] on 03/11/15 to get assistance with this process.  Patients daughter did request assistance with completing the Advanced Directive.  Home visit scheduled for 03/10/15 ay 12:00pm.   [place], [place] Management [phone]

## 2015-03-07 ENCOUNTER — Other Ambulatory Visit: Payer: Self-pay | Admitting: *Deleted

## 2015-03-10 ENCOUNTER — Other Ambulatory Visit: Payer: Self-pay | Admitting: *Deleted

## 2015-03-10 ENCOUNTER — Ambulatory Visit (INDEPENDENT_AMBULATORY_CARE_PROVIDER_SITE_OTHER): Payer: Commercial Managed Care - HMO | Admitting: Pharmacist

## 2015-03-10 DIAGNOSIS — Z7901 Long term (current) use of anticoagulants: Secondary | ICD-10-CM

## 2015-03-10 DIAGNOSIS — I2699 Other pulmonary embolism without acute cor pulmonale: Secondary | ICD-10-CM | POA: Diagnosis not present

## 2015-03-10 DIAGNOSIS — Z7902 Long term (current) use of antithrombotics/antiplatelets: Secondary | ICD-10-CM

## 2015-03-10 LAB — POCT INR: INR: 2.4

## 2015-03-10 NOTE — Patient Outreach (Signed)
[place] [facility]) Care Management  [facility] Social Work  03/10/2015  [name] [name] 1930/12/19 [id number]  Subjective:    Objective:   Current Medications:  Current Outpatient Prescriptions  Medication Sig Dispense Refill  . acetaminophen (TYLENOL) 325 MG tablet Take 2 tablets (650 mg total) by mouth every 4 (four) hours as needed for headache or mild pain.    [name] amLODipine (NORVASC) 2.5 MG tablet Take 1 tablet (2.5 mg total) by mouth daily. 30 tablet 6  . aspirin EC 81 MG EC tablet Take 1 tablet (81 mg total) by mouth daily.    [name] atorvastatin (LIPITOR) 40 MG tablet TAKE 1 TABLET (40 MG TOTAL) BY MOUTH EVERY EVENING. 90 tablet 1  . b complex vitamins tablet Take 1 tablet by mouth daily.    . Calcium Carbonate-Vitamin D (CALCIUM-VITAMIN D) 500-200 MG-UNIT per tablet Take 1 tablet by mouth daily.    . carbidopa-levodopa (SINEMET IR) 25-100 MG per tablet Take 1 tablet by mouth 2 (two) times daily. 60 tablet 3  . fish oil-omega-3 fatty acids 1000 MG capsule Take 2 g by mouth daily.     . furosemide (LASIX) 20 MG tablet Take 1 tablet (20 mg total) by mouth 2 (two) times daily. 180 tablet 3  . Multiple Vitamins-Minerals (MULTIVITAMIN WITH MINERALS) tablet Take 1 tablet by mouth daily.    [name] NAMENDA XR 28 MG CP24 Take 28 mg by mouth daily. 30 capsule 12  . omeprazole (PRILOSEC) 20 MG capsule TAKE 1 CAPSULE (20 MG TOTAL) BY MOUTH DAILY. 90 capsule 0  . warfarin (COUMADIN) 5 MG tablet Take 1&1/2 tablets on Mondays; Take 1 tablet all other days. (Patient taking differently: Take 5-7.5 mg by mouth daily. Take 1&1/2 tablets on Mondays; Take 1 tablet all other days.) 40 tablet 2  . zoster vaccine live, PF, (ZOSTAVAX) 40981 UNT/0.65ML injection Inject 19,400 Units into the skin once. 1 each 0   No current facility-administered medications for this visit.    Functional Status:  In your present state of health, do you have any difficulty performing the following activities: 02/03/2015  01/20/2015  Hearing? N N  Vision? N N  Difficulty concentrating or making decisions? Tempie Donning  Walking or climbing stairs? N Y  Dressing or bathing? N N  Doing errands, shopping? Y Y  Preparing Food and eating ? - -  Using the Toilet? - -  In the past six months, have you accidently leaked urine? - -  Do you have problems with loss of bowel control? - -  Managing your Medications? - -  Managing your Finances? - -  Housekeeping or managing your Housekeeping? - -    Fall/Depression Screening:  PHQ 2/9 Scores 03/10/2015 02/03/2015 01/20/2015 01/13/2015 12/23/2014 10/09/2014 09/30/2014  PHQ - 2 Score 1 0 0 4 0 0 1  PHQ- 9 Score - - - 14 - - -    Assessment: Home visit to patient to assist with completing his advanced directive.  Document discussed and explained, however patient hesitant to complete the document.  Per patient, he wanted time to talk to his family about it.  Patient's daughter reports having a clear understanding of what is needed to complete the form and will have patient completed after he has had time to discuss it with his son.   Patient interested in day programs to increase socialization.  Patient's daughter has the information to the [employer] to enroll patient in their day program.    Patient's daughter continues  to work with a representative to assist patient with aid and attendance through [employer]: This Education officer, museum will follow up with patient's daughter to assess for continued social work needs in 3 weeks.    Sheralyn Boatman [facility] Care Management [phone]

## 2015-03-10 NOTE — Patient Instructions (Signed)
Patient instructed to take medications as defined in the Anti-coagulation Track section of this encounter.  Patient instructed to take today's dose.  Patient's daughter instructed to call [facility] to determine WHEN they wish to procedure her father. Based upon their answer, we will effect a plan of action of stopping warfarin 5 days PRIOR to procedure; starting Lovenox at treatment doses 4 days PRIOR to procedure; STOPPING LOVENOX 1 day (24h) BEFORE procedure; and status-post procedure--"briding him back out onto warfarin".  Patient verbalized understanding of these instructions.

## 2015-03-10 NOTE — Progress Notes (Signed)
Anti-Coagulation Progress Note  [name] [name] is a 81 y.o. male who is currently on an anti-coagulation regimen.    RECENT RESULTS: Recent results are below, the most recent result is correlated with a dose of 37.5 mg. per week: Lab Results  Component Value Date   INR 2.40 03/10/2015   INR 2.74* 02/13/2015   INR 2.6 02/10/2015    ANTI-COAG DOSE: Anticoagulation Dose Instructions as of 03/10/2015      [name] Tue Wed Thu Fri Sat   New Dose 5 mg 7.5 mg 5 mg 5 mg 5 mg 5 mg 5 mg       ANTICOAG SUMMARY: Anticoagulation Episode Summary    Current INR goal 2.0-3.0  Next INR check 04/07/2015  INR from last check 2.40 (03/10/2015)  Weekly max dose   Target end date Indefinite  [facility]  Preferred lab   Send INR reminders to ANTICOAG IMP   Indications  Pulmonary embolism [I26.99] Encounter for long-term use of antiplatelets/antithrombotics [Z79.02]        Comments History of multiple venous embolic episodes. Will continue to annual re-evaluate continued need for warfarin weighing risks vs. benefits.       Anticoagulation Care Providers    Provider Role Specialty Phone number   Bertha Stakes, MD  Internal Medicine [phone]      ANTICOAG TODAY: Anticoagulation Summary as of 03/10/2015    INR goal 2.0-3.0  Selected INR 2.40 (03/10/2015)  Next INR check 04/07/2015  Target end date Indefinite   Indications  Pulmonary embolism [I26.99] Encounter for long-term use of antiplatelets/antithrombotics [Z79.02]      Anticoagulation Episode Summary    [facility]   Preferred lab    Send INR reminders to ANTICOAG IMP   Comments History of multiple venous embolic episodes. Will continue to annual re-evaluate continued need for warfarin weighing risks vs. benefits.     Anticoagulation Care Providers    Provider Role Specialty Phone number   Bertha Stakes, MD  Internal Medicine [phone]      PATIENT INSTRUCTIONS: Patient  Instructions  Patient instructed to take medications as defined in the Anti-coagulation Track section of this encounter.  Patient instructed to take today's dose.  Patient's daughter instructed to call [facility] to determine WHEN they wish to procedure her father. Based upon their answer, we will effect a plan of action of stopping warfarin 5 days PRIOR to procedure; starting Lovenox at treatment doses 4 days PRIOR to procedure; STOPPING LOVENOX 1 day (24h) BEFORE procedure; and status-post procedure--"briding him back out onto warfarin".  Patient verbalized understanding of these instructions.       FOLLOW-UP Return in 4 weeks (on 04/07/2015) for Follow up INR at 1030h.  Jorene Guest, [name] Pharm.D., CACP  Anti-Coagulation Progress Note  [name] [name] is a 81 y.o. male who is currently on an anti-coagulation regimen.    RECENT RESULTS: Recent results are below, the most recent result is correlated with a dose of 37.5 mg. per week: Lab Results  Component Value Date   INR 2.40 03/10/2015   INR 2.74* 02/13/2015   INR 2.6 02/10/2015    ANTI-COAG DOSE: Anticoagulation Dose Instructions as of 03/10/2015      [name] Tue Wed Thu Fri Sat   New Dose 5 mg 7.5 mg 5 mg 5 mg 5 mg 5 mg 5 mg       ANTICOAG SUMMARY: Anticoagulation Episode Summary    Current INR goal 2.0-3.0  Next INR  check 04/07/2015  INR from last check 2.40 (03/10/2015)  Weekly max dose   Target end date Indefinite  [facility]  Preferred lab   Send INR reminders to ANTICOAG IMP   Indications  Pulmonary embolism [I26.99] Encounter for long-term use of antiplatelets/antithrombotics [Z79.02]        Comments History of multiple venous embolic episodes. Will continue to annual re-evaluate continued need for warfarin weighing risks vs. benefits.       Anticoagulation Care Providers    Provider Role Specialty Phone number   Bertha Stakes, MD  Internal Medicine [phone]       ANTICOAG TODAY: Anticoagulation Summary as of 03/10/2015    INR goal 2.0-3.0  Selected INR 2.40 (03/10/2015)  Next INR check 04/07/2015  Target end date Indefinite   Indications  Pulmonary embolism [I26.99] Encounter for long-term use of antiplatelets/antithrombotics [Z79.02]      Anticoagulation Episode Summary    [facility]   Preferred lab    Send INR reminders to ANTICOAG IMP   Comments History of multiple venous embolic episodes. Will continue to annual re-evaluate continued need for warfarin weighing risks vs. benefits.     Anticoagulation Care Providers    Provider Role Specialty Phone number   Bertha Stakes, MD  Internal Medicine [phone]      PATIENT INSTRUCTIONS: Patient Instructions  Patient instructed to take medications as defined in the Anti-coagulation Track section of this encounter.  Patient instructed to take today's dose.  Patient's daughter instructed to call [facility] to determine WHEN they wish to procedure her father. Based upon their answer, we will effect a plan of action of stopping warfarin 5 days PRIOR to procedure; starting Lovenox at treatment doses 4 days PRIOR to procedure; STOPPING LOVENOX 1 day (24h) BEFORE procedure; and status-post procedure--"briding him back out onto warfarin".  Patient verbalized understanding of these instructions.       FOLLOW-UP Return in 4 weeks (on 04/07/2015) for Follow up INR at 1030h.  Jorene Guest, [name] Pharm.D., CACP

## 2015-03-11 ENCOUNTER — Other Ambulatory Visit: Payer: Self-pay | Admitting: Neurology

## 2015-03-11 NOTE — Progress Notes (Signed)
INTERNAL MEDICINE TEACHING ATTENDING ADDENDUM - [name] [name] M.D  Duration- indefinite, Indication- recurrent PE, INR- therapeutic. Agree with pharmacy recommendations as outlined in their note.      

## 2015-03-13 ENCOUNTER — Encounter: Payer: Self-pay | Admitting: *Deleted

## 2015-03-13 NOTE — Patient Outreach (Signed)
[place] [facility]) Care Management  03/13/2015  [name] [name] [birth year] [id number]   EMMI on Advanced Directive assigned and mailed to patient's home for further explanation of need for Advanced Directive.   Sheralyn Boatman [facility] Care Management [phone]

## 2015-03-17 ENCOUNTER — Ambulatory Visit (INDEPENDENT_AMBULATORY_CARE_PROVIDER_SITE_OTHER): Payer: Commercial Managed Care - HMO | Admitting: *Deleted

## 2015-03-17 DIAGNOSIS — R55 Syncope and collapse: Secondary | ICD-10-CM

## 2015-03-18 ENCOUNTER — Other Ambulatory Visit: Payer: Self-pay | Admitting: *Deleted

## 2015-03-18 ENCOUNTER — Ambulatory Visit: Payer: Commercial Managed Care - HMO | Admitting: *Deleted

## 2015-03-18 LAB — CUP PACEART REMOTE DEVICE CHECK: MDC IDC SESS DTM: 20160628174756

## 2015-03-18 NOTE — Patient Outreach (Signed)
Routine home visit scheduled for this morning at 11am.  Call placed to members daughter, [name], prior to appointment time to confirm appointment, no answer.  Message left, requesting daughter to provide this care manager with a callback.  This care manager proceeded with planned schedule, arriving to the home at scheduled time.  Door bell rang with no answer.  Call placed again to [name], successfully.  Daughter states that she has a doctor's appointment for herself this morning and forgot about the appointment with this care manager.  Reassurance provided that appointment could be rescheduled.  Daughter asked to call this care manager back when she was home and was able to look at her schedule for another appointment date.  Daughter verbalizes understanding.  [name], BSN, [place] Management  [facility] Care Manager [phone]

## 2015-03-19 NOTE — Progress Notes (Signed)
Loop recorder 

## 2015-03-20 NOTE — Addendum Note (Signed)
Addended by: Hulan Fray on: 03/20/2015 05:25 PM   Modules accepted: Orders

## 2015-03-28 ENCOUNTER — Other Ambulatory Visit: Payer: Self-pay | Admitting: *Deleted

## 2015-03-28 NOTE — Patient Outreach (Signed)
As requested per daughter, call placed to reschedule routine home visit.  Ms. [name] [name] confirms a date for home visit.  She also expresses interest in help with the education of family regarding members disease progression (Alzheimers/Dementia) and best ways to cope with the advancement of the disease.  Ms. [name] [name] made aware that education (EMMI education on Alzheimer's) would be mailed to her home to assist with education of family.  She denies any other concerns at this time.  [name], BSN, [place] Management  [facility] Care Manager [phone]

## 2015-03-31 ENCOUNTER — Encounter: Payer: Self-pay | Admitting: Internal Medicine

## 2015-04-03 ENCOUNTER — Encounter: Payer: Self-pay | Admitting: *Deleted

## 2015-04-03 NOTE — Progress Notes (Signed)
Cardiology Office Note   Date:  04/04/2015   ID:  [name] [name], DOB 02-16-1931, MRN [id number]  PCP:  [name], DO  Cardiologist:  Dr. Ena Dawley   Electrophysiologist:  Dr. [name]   Chief Complaint  Patient presents with  . Coronary Artery Disease  . Cardiomyopathy     History of Present Illness: [name] [name] is a 81 y.o. male with a hx of nonobstructive CAD by cardiac catheterization in 2008, bradycardia, prior recurrent pulmonary embolism on chronic Coumadin, HTN, prostate CA, HL, depression, dementia.    Admitted 11/2014 with syncope. Patient complained of chest pain prior to his episode of syncope and BP was 70/40 with heart rate in the 40s. He was placed on transcutaneous pacing and was noted have underlying sinus bradycardia and frequent PVCs followed by compensatory pauses.  He was maintained on epinephrine and then dopamine. Cardiac enzymes were normal. Cardiac catheterization was obtained and demonstrated nonobstructive CAD. 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 He was placed on calcium channel blocker therapy. Coumadin was resumed. Echocardiogram demonstrated EF 40-45%. He was seen by Dr. [name] with electrophysiology.  Underlying conduction system disease was noted with RBBB, first-degree AV block and prior symptomatic bradycardia noted in May 2014 that improved off of Aricept. Ultimately decision was made to place a loop recorder instead of pacemaker.     Studies/Reports Reviewed Today:  LHC 12/10/14 Left main: Large caliber normal long vessel which bifurcated into an LAD and left circumflex vessel.  LAD:  Diagonal prox 40-50% (small).   LAD after Dx 50%; mid LAD with significant mid systolic bridging with narrowing up to 80% during systole that improved with IC nitroglycerin and with diastole 20% narrowed. Left circumflex: Small tortuous vessel which gave rise to 2 OM branches.  Right coronary artery: Large dominant normal vessel that supplied the  PDA and PLA vessel. Left ventriculography:  EF 50-55%.  There was ectopy without definitive wall motion abnormalities.  Echo 12/09/14 - EF 40% to 45%.  Regional wall motion abnormalities cannot be excluded. Grade 1 diastolic dysfunction.   Past Medical History  Diagnosis Date  . PE (pulmonary embolism)     unprovoked PE completed 6 months of warfarin, warfarin d/ced 02/12/2010, repeat PE 07/10/2010 after a long car ride and now on lifelong coumadin.  [name] Hypertension   . Pituitary microadenoma     incidental finding CT 12/2009  . Prostate cancer   . Depression   . Hyperlipidemia   . Coronary artery disease, non-occlusive     with history of MI; Cath 2008 w multivessel nonobstructive CAD  . Dementia     Past Surgical History  Procedure Laterality Date  . Prostatectomy    . Left heart catheterization with coronary angiogram N/A 12/10/2014    Procedure: LEFT HEART CATHETERIZATION WITH CORONARY ANGIOGRAM;  Surgeon: [name], MD;  Location: [facility] CATH LAB;  Service: Cardiovascular;  Laterality: N/A;  . Loop recorder implant N/A 12/16/2014    Procedure: LOOP RECORDER IMPLANT;  Surgeon: [name], MD;  Location: [facility] CATH LAB;  Service: Cardiovascular;  Laterality: N/A;     Current Outpatient Prescriptions  Medication Sig Dispense Refill  . acetaminophen (TYLENOL) 325 MG tablet Take 2 tablets (650 mg total) by mouth every 4 (four) hours as needed for headache or mild pain.    [name] amLODipine (NORVASC) 2.5 MG tablet Take 1 tablet (2.5 mg total) by mouth daily. 30 tablet 6  . aspirin EC 81 MG EC tablet Take 1  tablet (81 mg total) by mouth daily.    [name] atorvastatin (LIPITOR) 40 MG tablet TAKE 1 TABLET (40 MG TOTAL) BY MOUTH EVERY EVENING. 90 tablet 1  . b complex vitamins tablet Take 1 tablet by mouth daily.    . Calcium Carbonate-Vitamin D (CALCIUM-VITAMIN D) 500-200 MG-UNIT per tablet Take 1 tablet by mouth daily.    . carbidopa-levodopa (SINEMET IR) 25-100 MG per tablet Take 1 tablet by  mouth 2 (two) times daily. 60 tablet 3  . fish oil-omega-3 fatty acids 1000 MG capsule Take 2 g by mouth daily.     . furosemide (LASIX) 20 MG tablet Take 1 tablet (20 mg total) by mouth 2 (two) times daily. 180 tablet 3  . Multiple Vitamins-Minerals (MULTIVITAMIN WITH MINERALS) tablet Take 1 tablet by mouth daily.    [name] NAMENDA XR 28 MG CP24 24 hr capsule TAKE 1 CAPSULE BY MOUTH DAILY 30 capsule 3  . omeprazole (PRILOSEC) 20 MG capsule TAKE 1 CAPSULE (20 MG TOTAL) BY MOUTH DAILY. 90 capsule 0  . warfarin (COUMADIN) 5 MG tablet Take 1&1/2 tablets on Mondays; Take 1 tablet all other days. (Patient taking differently: Take 5-7.5 mg by mouth daily. Take 1&1/2 tablets on Mondays; Take 1 tablet all other days.) 40 tablet 2  . zoster vaccine live, PF, (ZOSTAVAX) 32355 UNT/0.65ML injection Inject 19,400 Units into the skin once. 1 each 0   No current facility-administered medications for this visit.    Allergies:   Aricept and Other    Social History:  The patient  reports that he has never smoked. He has never used smokeless tobacco. He reports that he does not drink alcohol or use illicit drugs.   Family History:  The patient's family history includes Cervical cancer in his other; Dementia in his mother; Hypertension in his child and father; Lung cancer in his other; Stroke in his other. There is no history of Heart attack.    ROS:   Please see the history of present illness.   Review of Systems  All other systems reviewed and are negative.     PHYSICAL EXAM: VS:  There were no vitals taken for this visit.    Wt Readings from Last 3 Encounters:  02/25/15 161 lb 3.2 oz (73.12 kg)  02/21/15 153 lb (69.4 kg)  02/13/15 153 lb (69.4 kg)     GEN: Well nourished, well developed, in no acute distress HEENT: normal Neck: no JVD, no masses Cardiac:  Normal S1/S2, RRR; no murmur ,  no rubs or gallops, no edema  Respiratory:  clear to auscultation bilaterally, no wheezing, rhonchi or  rales. GI: soft, nontender, nondistended, + BS MS: no deformity or atrophy; first toe MTPJ slightly erythematous and tender to palpation Skin: warm and dry  Neuro:  CNs II-XII intact, Strength and sensation are intact Psych: Normal affect   EKG:  EKG is ordered today.  It demonstrates:      Recent Labs: 12/07/2014: ALT 7; B Natriuretic Peptide 90.9 12/18/2014: TSH 0.504 02/13/2015: BUN 10; Creatinine, Ser 1.01; Hemoglobin 11.2*; Platelets 163; Potassium 3.6; Sodium 140    Lipid Panel    Component Value Date/Time   CHOL 207* 06/30/2012 1153   TRIG 129 06/30/2012 1153   HDL 56 06/30/2012 1153   CHOLHDL 3.7 06/30/2012 1153   VLDL 26 06/30/2012 1153   LDLCALC 125* 06/30/2012 1153      ASSESSMENT AND PLAN:  Syncope, unspecified syncope type ILR was interrogated at wound check in the last 2  weeks. No significant arrhythmias were noted at that time. The patient has not had any recurrent syncope. There is a strong suspicion for AV block causing his syncope. He is not currently driving.  Symptomatic bradycardia He is not on AV nodal blocking agents. His heart rate is currently controlled.  Recurrent pulmonary embolism Coumadin is managed by primary care.  Coronary artery disease involving native coronary artery of native heart without angina pectoris No angina. 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 Continue aspirin, statin.  No beta blocker given history of bradycardia.  HLD (hyperlipidemia) Continue statin.  Essential hypertension, benign  Controlled.  Cardiomyopathy EF 40-45% by recent echocardiogram. He is not on beta blocker due to history of bradycardia. He was placed on amlodipine given intramyocardial bridging. Given his frailty, I am hesitant to add another medication to his regimen that would lower his blood pressure. We can certainly consider adding ACE inhibitor in the future. LE edema is much  improved on Lasix. Check basic metabolic panel today.  Alzheimer's dementia Follow-up with neurology as planned.  Acute gout of right foot, unspecified cause Given his comorbid illnesses and current medications, I am hesitant to place him on NSAIDs or colchicine. I will give him a prednisone Dosepak for 6 days (5 mg dose). We have advised him to follow-up with primary care next week to have his INR rechecked as well as to reevaluate his gout.  Midline back pain, unspecified location  Follow-up with primary care to arrange referral to pain clinic.   Medication Changes: Current medicines are reviewed at length with the patient today.  Concerns regarding medicines are as outlined above.  The following changes have been made:   Discontinued Medications   No medications on file   Modified Medications   No medications on file   New Prescriptions   No medications on file      Labs/ tests ordered today include:  No orders of the defined types were placed in this encounter.    Disposition:   FU    Signed, Versie Starks, MHS 04/04/2015 10:27 AM    [facility] [place], [place], [place]  [address] Phone: [phone]; Fax: [phone]    This encounter was created in error - please disregard.

## 2015-04-03 NOTE — Patient Outreach (Signed)
[place] [facility]) Care Management  04/03/2015  [name] [name] Mar 25, 1931 [id number]   Involvement letter routed to patient's provider.     Sheralyn Boatman [facility] Care Management [phone]

## 2015-04-04 ENCOUNTER — Other Ambulatory Visit: Payer: Self-pay | Admitting: *Deleted

## 2015-04-04 ENCOUNTER — Encounter: Payer: Commercial Managed Care - HMO | Admitting: Physician Assistant

## 2015-04-04 NOTE — Patient Outreach (Signed)
[place] [facility]) Care Management  04/04/2015  [name] [name] 03-Sep-1931 [id number]   Phone call to patient's daughter to assess for further social work involvement.  Community resources provided previously for the [place] for [employer].  Patient and daughter also provided with Advanced Directive.  Per patient's daughter, document is not completed as they are still discussing the advanced directive components with family.  Patient's case to be closed to social work at this time.    Patient/patient's daughter to contact this Probation officer if social work involvement is needed in the future.   Sheralyn Boatman [facility] Care Management [phone]

## 2015-04-07 ENCOUNTER — Ambulatory Visit (INDEPENDENT_AMBULATORY_CARE_PROVIDER_SITE_OTHER): Payer: Commercial Managed Care - HMO | Admitting: Pharmacist

## 2015-04-07 DIAGNOSIS — Z7902 Long term (current) use of antithrombotics/antiplatelets: Secondary | ICD-10-CM

## 2015-04-07 DIAGNOSIS — I2699 Other pulmonary embolism without acute cor pulmonale: Secondary | ICD-10-CM

## 2015-04-07 DIAGNOSIS — Z7901 Long term (current) use of anticoagulants: Secondary | ICD-10-CM | POA: Diagnosis not present

## 2015-04-07 LAB — POCT INR: INR: 2

## 2015-04-07 NOTE — Patient Instructions (Signed)
Patient instructed to take medications as defined in the Anti-coagulation Track section of this encounter.  Patient instructed to take today's dose.  Patient verbalized understanding of these instructions.    

## 2015-04-07 NOTE — Progress Notes (Signed)
Anti-Coagulation Progress Note  [name] [name] is a 81 y.o. male who is currently on an anti-coagulation regimen.    RECENT RESULTS: Recent results are below, the most recent result is correlated with a dose of 37.5 mg. per week: Lab Results  Component Value Date   INR 2.00 04/07/2015   INR 2.40 03/10/2015   INR 2.74* 02/13/2015    ANTI-COAG DOSE: Anticoagulation Dose Instructions as of 04/07/2015      [name] Tue Wed Thu Fri Sat   New Dose 5 mg 7.5 mg 5 mg 5 mg 7.5 mg 5 mg 5 mg       ANTICOAG SUMMARY: Anticoagulation Episode Summary    Current INR goal 2.0-3.0  Next INR check 05/05/2015  INR from last check 2.00 (04/07/2015)  Weekly max dose   Target end date Indefinite  [facility]  Preferred lab   Send INR reminders to ANTICOAG IMP   Indications  Pulmonary embolism [I26.99] Encounter for long-term use of antiplatelets/antithrombotics [Z79.02]        Comments History of multiple venous embolic episodes. Will continue to annual re-evaluate continued need for warfarin weighing risks vs. benefits.       Anticoagulation Care Providers    Provider Role Specialty Phone number   Bertha Stakes, MD  Internal Medicine [phone]      ANTICOAG TODAY: Anticoagulation Summary as of 04/07/2015    INR goal 2.0-3.0  Selected INR 2.00 (04/07/2015)  Next INR check 05/05/2015  Target end date Indefinite   Indications  Pulmonary embolism [I26.99] Encounter for long-term use of antiplatelets/antithrombotics [Z79.02]      Anticoagulation Episode Summary    [facility]   Preferred lab    Send INR reminders to ANTICOAG IMP   Comments History of multiple venous embolic episodes. Will continue to annual re-evaluate continued need for warfarin weighing risks vs. benefits.     Anticoagulation Care Providers    Provider Role Specialty Phone number   Bertha Stakes, MD  Internal Medicine [phone]      PATIENT INSTRUCTIONS: Patient  Instructions  Patient instructed to take medications as defined in the Anti-coagulation Track section of this encounter.  Patient instructed to take today's dose.  Patient verbalized understanding of these instructions.       FOLLOW-UP Return in 4 weeks (on 05/05/2015) for Follow up INR at 1130h.  Jorene Guest, [name] Pharm.D., CACP

## 2015-04-08 ENCOUNTER — Encounter: Payer: Self-pay | Admitting: Internal Medicine

## 2015-04-08 ENCOUNTER — Encounter: Payer: Self-pay | Admitting: Physician Assistant

## 2015-04-08 ENCOUNTER — Other Ambulatory Visit: Payer: Self-pay | Admitting: *Deleted

## 2015-04-08 NOTE — Patient Outreach (Addendum)
[facility]) Care Management   04/08/2015  [name] [name] 07/29/1931 [id number]  [name] [name] is an 81 y.o. male  Subjective:   Objective:   Review of Systems  Constitutional: Negative.   HENT: Negative.   Eyes: Negative.   Respiratory: Negative.   Cardiovascular: Positive for leg swelling.       Bilateral ankle swelling   Gastrointestinal: Negative.   Genitourinary: Negative.   Musculoskeletal: Negative.   Skin: Negative.   Neurological: Negative.   Endo/Heme/Allergies: Negative.   Psychiatric/Behavioral: Positive for memory loss. The patient has insomnia.        Insomnia is intermittent    Physical Exam  Constitutional: He appears well-developed and well-nourished.  Neck: Normal range of motion.  Cardiovascular: Normal rate, regular rhythm and normal heart sounds.   Respiratory: Effort normal and breath sounds normal.  GI: Soft. Bowel sounds are normal.  Musculoskeletal: Normal range of motion.  Neurological: He is alert.  Skin: Skin is warm and dry.   BP 118/74 mmHg  Pulse 70  Resp 18  SpO2 96%  Current Medications:   Current Outpatient Prescriptions  Medication Sig Dispense Refill  . acetaminophen (TYLENOL) 325 MG tablet Take 2 tablets (650 mg total) by mouth every 4 (four) hours as needed for headache or mild pain.    [name] amLODipine (NORVASC) 2.5 MG tablet Take 1 tablet (2.5 mg total) by mouth daily. 30 tablet 6  . aspirin EC 81 MG EC tablet Take 1 tablet (81 mg total) by mouth daily.    [name] atorvastatin (LIPITOR) 40 MG tablet TAKE 1 TABLET (40 MG TOTAL) BY MOUTH EVERY EVENING. 90 tablet 1  . b complex vitamins tablet Take 1 tablet by mouth daily.    . Calcium Carbonate-Vitamin D (CALCIUM-VITAMIN D) 500-200 MG-UNIT per tablet Take 1 tablet by mouth daily.    . carbidopa-levodopa (SINEMET IR) 25-100 MG per tablet Take 1 tablet by mouth 2 (two) times daily. 60 tablet 3  . fish oil-omega-3 fatty acids 1000 MG capsule Take 2 g by mouth daily.      . furosemide (LASIX) 20 MG tablet Take 1 tablet (20 mg total) by mouth 2 (two) times daily. 180 tablet 3  . Multiple Vitamins-Minerals (MULTIVITAMIN WITH MINERALS) tablet Take 1 tablet by mouth daily.    [name] NAMENDA XR 28 MG CP24 24 hr capsule TAKE 1 CAPSULE BY MOUTH DAILY 30 capsule 3  . omeprazole (PRILOSEC) 20 MG capsule TAKE 1 CAPSULE (20 MG TOTAL) BY MOUTH DAILY. 90 capsule 0  . warfarin (COUMADIN) 5 MG tablet Take 1&1/2 tablets on Mondays; Take 1 tablet all other days. (Patient taking differently: Take 5-7.5 mg by mouth daily. Take 1&1/2 tablets on Mondays; Take 1 tablet all other days.) 40 tablet 2  . zoster vaccine live, PF, (ZOSTAVAX) 27741 UNT/0.65ML injection Inject 19,400 Units into the skin once. 1 each 0   No current facility-administered medications for this visit.    Functional Status:   In your present state of health, do you have any difficulty performing the following activities: 02/03/2015 01/20/2015  Hearing? N N  Vision? N N  Difficulty concentrating or making decisions? [name] [name]  Walking or climbing stairs? N Y  Dressing or bathing? N N  Doing errands, shopping? Y Y  Preparing Food and eating ? - -  Using the Toilet? - -  In the past six months, have you accidently leaked urine? - -  Do you have problems with loss of bowel control? - -  Managing your Medications? - -  Managing your Finances? - -  Housekeeping or managing your Housekeeping? - -    Fall/Depression Screening:    PHQ 2/9 Scores 03/10/2015 02/03/2015 01/20/2015 01/13/2015 12/23/2014 10/09/2014 09/30/2014  PHQ - 2 Score 1 0 0 4 0 0 1  PHQ- 9 Score - - - 14 - - -    Assessment:    Arrived at [facility] home, daughter/caregiver present.  Member denies any pain or discomfort at this time.  Member has some swelling in his feet, left more than the right, encouraged to keep elevated when a seated position.  Daughter states that there has been no weight gain and that she has noticed there is more swelling when his legs are  not elevated.  She states that he was up through the night in he recliner, but did not have his legs elevated.  Daughter states that the member has began to fall frequently, having 3 falls since the last home visit a month ago.  She states that the member has not fell completely to the floor, but has stumbled and fallen back into a chair, an ottoman, or the bed.  She report that she feels that he is moving too fast once he gets up from a seated position and not getting his balance before he tries to walk.  Fall prevention education provided.  This care manager encouraged daughter to work with member on using a cane to assist with stabilization and walking.  Daughter states that the member is due for a follow up appointment and that she will inquire about restarting home health physical therapy.  She also reports that the member has not been sleeping well on a consistent basis and that he often states that he is nervous.  She states that this usually happens if he knows that something has either happened with his family or his health.  She will inquire about this also at the next visit.    Daughter previously requested information and resources for Alzheimer's management.  EMMI education on Alzheimer's provided.  Daughter also requests some ideas on increasing mental and physical activities with the member.  Websites provided by [facility] reviewed, daughter states that she will look into working on exercises with the member.  She also states that she will look into registering the member with [employer] in effort to get the member out of the house and more active.  Daughter states that her main concern at this time is the member's mobility.  She has continued to monitor his weight and blood pressure and they are both now below the member's target.  She denies any other concerns at this time.  Encouraged to contact this care manager with any questions.  Plan:   Routine home visit scheduled for next  month.  [facility] CM Care Plan Problem One        Patient Outreach from 04/08/2015 in [employer]   Care Plan Problem One  Hypertension   Care Plan for Problem One  Not Active   [facility] CM Short Term Goal #1 (0-30 days)  Member will have blood pressure consistently within range witihn the next 4 weeks   [facility] CM Short Term Goal #1 Start Date  02/10/15   [employer] [facility] CM Short Term Goal #1 Met Date  04/08/15   Interventions for Short Term Goal #1  Discussed contuing to monitor blood pressure and discussing options to control blood pressure with primary care office within the next week  [facility] CM Short Term Goal #2 (0-30 days)  Member/Caregiver will record blood pressure daily for the next 4 weeks   [facility] CM Short Term Goal #2 Start Date  01/13/15   [facility] CM Short Term Goal #2 Met Date  02/10/15   Interventions for Short Term Goal #2  Discussed the importance of documenting daily blood pressure readings to better communicate with physician    [place] Problem Two        Patient Outreach from 04/08/2015 in [place] Problem Two  Fluid buildup   Care Plan for Problem Two  Not Active   [facility] CM Short Term Goal #1 (0-30 days)  Daughter will obtain a scale for weights within the next 2 weeks   [facility] CM Short Term Goal #1 Start Date  02/10/15   [facility] CM Short Term Goal #1 Met Date   04/01/15   Interventions for Short Term Goal #2   Discussed the importance of having a working scale, especially since the member is now on Lasix for swelling   [facility] CM Short Term Goal #2 (0-30 days)  Member will have daily weights documented for the next 4 weeks   THN CM Short Term Goal #2 Start Date  02/10/15   [facility] CM Short Term Goal #2 Met Date  04/08/15   Interventions for Short Term Goal #2  Discussed the importance of documenting daily weights to monitor fluid levels    [facility] CM Care Plan Problem Three        Patient Outreach from 04/08/2015 in [place] Problem Three  Back  Pain   [place] for Problem Three  Not Active      [name], BSN, [place] Manager [phone]

## 2015-04-13 NOTE — Progress Notes (Signed)
Cardiology Office Note   Date:  04/14/2015   ID:  [name] [name], DOB Oct 08, 1930, MRN [id number]  PCP:  [name], DO  Cardiologist:  Dr. Ena Dawley   Electrophysiologist:  Dr. [name]   Chief Complaint  Patient presents with  . Congestive Heart Failure     History of Present Illness: [name] [name] is a 81 y.o. male with a hx of nonobstructive CAD by cardiac catheterization in 2008, bradycardia, prior recurrent pulmonary embolism on chronic Coumadin, HTN, prostate CA, HL, depression, dementia.   Admitted 11/2014 with syncope and chest pain.  BP was 70/40 with heart rate in the 40s. He was placed on transcutaneous pacing and was noted have underlying sinus bradycardia and frequent PVCs followed by compensatory pauses. He was maintained on epinephrine and then dopamine. LHC demonstrated nonobstructive CAD. There was mid LAD myocardial bridging noted and he was placed on calcium channel blocker therapy.  Echocardiogram demonstrated EF 40-45%. He was seen by Dr. [name] with electrophysiology. Underlying conduction system disease was noted with RBBB, first-degree AV block and prior symptomatic bradycardia noted in May 2014 that improved off of Aricept. Ultimately decision was made to place a loop recorder instead of pacemaker.   Last seen by Dr. Meda Coffee 02/21/15. Lasix was increased secondary to worsening edema. He returns for follow-up. He is here today with his daughter. She monitors his blood pressure closely. It typically runs 120s to 150s. Today, his blood pressure was 90/50. I rechecked it and it was 100/80.  He feels weak.  He denies syncope. No orthopnea, PND.  LE edema unchanged.  No chest pain or significant dyspnea.     Studies/Reports Reviewed Today:  LHC 12/10/14 Left main: Large caliber normal long vessel which bifurcated into an LAD and left circumflex vessel.  LAD: pDx 40-50% (small). LAD after Dx 50%; mid LAD with significant mid systolic bridging with  narrowing up to 80% during systole that improved with IC nitroglycerin and with diastole 20% narrowed. LCx: Small tortuous vessel which gave rise to 2 OM branches.  RCA: Large dominant normal vessel that supplied the PDA and PLA vessel. Left ventriculography: EF 50-55%. There was ectopy without definitive wall motion abnormalities.  Echo 12/09/14 - EF 40% to 45%. Regional wall motion abnormalities cannot be excluded. Grade 1 diastolic dysfunction.   Past Medical History  Diagnosis Date  . PE (pulmonary embolism)     unprovoked PE completed 6 months of warfarin, warfarin d/ced 02/12/2010, repeat PE 07/10/2010 after a long car ride and now on lifelong coumadin.  [name] Hypertension   . Pituitary microadenoma     incidental finding CT 12/2009  . Prostate cancer   . Depression   . Hyperlipidemia   . Coronary artery disease, non-occlusive     with history of MI; Cath 2008 w multivessel nonobstructive CAD  . Dementia     Past Surgical History  Procedure Laterality Date  . Prostatectomy    . Left heart catheterization with coronary angiogram N/A 12/10/2014    Procedure: LEFT HEART CATHETERIZATION WITH CORONARY ANGIOGRAM;  Surgeon: [name], MD;  Location: [facility] CATH LAB;  Service: Cardiovascular;  Laterality: N/A;  . Loop recorder implant N/A 12/16/2014    Procedure: LOOP RECORDER IMPLANT;  Surgeon: [name], MD;  Location: [facility] CATH LAB;  Service: Cardiovascular;  Laterality: N/A;     Current Outpatient Prescriptions  Medication Sig Dispense Refill  . acetaminophen (TYLENOL) 500 MG tablet Take 500 mg by mouth every 4 (four) hours as  needed for mild pain or headache.    [name] amLODipine (NORVASC) 2.5 MG tablet Take 1 tablet (2.5 mg total) by mouth daily. 30 tablet 6  . aspirin EC 81 MG EC tablet Take 1 tablet (81 mg total) by mouth daily.    [name] atorvastatin (LIPITOR) 40 MG tablet TAKE 1 TABLET (40 MG TOTAL) BY MOUTH EVERY EVENING. 90 tablet 1  . b complex vitamins tablet Take 1 tablet by  mouth daily.    . Calcium Carbonate-Vitamin D (CALCIUM-VITAMIN D) 500-200 MG-UNIT per tablet Take 1 tablet by mouth daily.    . carbidopa-levodopa (SINEMET IR) 25-100 MG per tablet Take 1 tablet by mouth 2 (two) times daily. 60 tablet 3  . fish oil-omega-3 fatty acids 1000 MG capsule Take 2 g by mouth daily.     . furosemide (LASIX) 20 MG tablet Take 1 tablet (20 mg total) by mouth daily.    . Multiple Vitamins-Minerals (MULTIVITAMIN WITH MINERALS) tablet Take 1 tablet by mouth daily.    [name] NAMENDA XR 28 MG CP24 24 hr capsule TAKE 1 CAPSULE BY MOUTH DAILY 30 capsule 3  . omeprazole (PRILOSEC) 20 MG capsule TAKE 1 CAPSULE (20 MG TOTAL) BY MOUTH DAILY. 90 capsule 0  . warfarin (COUMADIN) 5 MG tablet Take 1&1/2 tablets on Mondays; Take 1 tablet all other days. (Patient taking differently: Take 5-7.5 mg by mouth daily. Take 1&1/2 tablets on Mondays; Take 1 tablet all other days.) 40 tablet 2  . zoster vaccine live, PF, (ZOSTAVAX) 83151 UNT/0.65ML injection Inject 19,400 Units into the skin once. 1 each 0   No current facility-administered medications for this visit.    Allergies:   Aricept and Other    Social History:  The patient  reports that he has never smoked. He has never used smokeless tobacco. He reports that he does not drink alcohol or use illicit drugs.   Family History:  The patient's family history includes Cervical cancer in his other; Dementia in his mother; Hypertension in his child and father; Lung cancer in his other; Stroke in his other. There is no history of Heart attack.    ROS:   Please see the history of present illness.   Review of Systems  Gastrointestinal: Negative for diarrhea, hematochezia, melena, nausea and vomiting.  Genitourinary: Negative for hematuria.  Neurological: Positive for loss of balance.  Psychiatric/Behavioral: The patient is nervous/anxious.   All other systems reviewed and are negative.     PHYSICAL EXAM: VS:  BP 90/50 mmHg  Pulse 77  Ht  5\' 10"  (1.778 m)  Wt 155 lb 12.8 oz (70.67 kg)  BMI 22.35 kg/m2    Wt Readings from Last 3 Encounters:  04/14/15 155 lb 12.8 oz (70.67 kg)  02/25/15 161 lb 3.2 oz (73.12 kg)  02/21/15 153 lb (69.4 kg)     GEN: Well nourished, well developed, in no acute distress, sleepy, sitting in a wheelchair. HEENT: normal Neck: no JVD at 90 degrees, no carotid bruits, no masses Cardiac:  Normal S1/S2, RRR; no murmur ,  no rubs or gallops, 1+ bilateral LE edema   Respiratory:  clear to auscultation bilaterally, no wheezing, rhonchi or rales. GI: soft, nontender, nondistended, + BS MS: no deformity or atrophy Skin: warm and dry  Neuro:  CNs II-XII intact, Strength and sensation are intact Psych: Normal affect   EKG:  EKG is ordered today.  It demonstrates:   NSR, HR 77, RBBB, LAD, PVCs, T-wave inversion in V3, V4, no change from  prior tracings   Recent Labs: 12/07/2014: ALT 7; B Natriuretic Peptide 90.9 12/18/2014: TSH 0.504 04/14/2015: BUN 14; Creatinine, Ser 1.07; Hemoglobin 12.0*; Platelets 150.0; Potassium 3.4*; Sodium 142    Lipid Panel    Component Value Date/Time   CHOL 207* 06/30/2012 1153   TRIG 129 06/30/2012 1153   HDL 56 06/30/2012 1153   CHOLHDL 3.7 06/30/2012 1153   VLDL 26 06/30/2012 1153   LDLCALC 125* 06/30/2012 1153      ASSESSMENT AND PLAN:  Chronic Systolic CHF:  Volume appears stable.  Weight is down since last visit (161 >> 155).  He is fairly sedentary.  I suspect a lot of his edema is related to inactivity, venous insufficiency and Amlodipine.  BP is running low and he is somewhat symptomatic with this. I will decrease his Lasix to 20 mg QD.  His daughter knows to give him an extra dose of Lasix 20 mg if his weight increases or there is significant increase in edema.  I have asked her to get him compression socks and to wear them daily.  Check BMET, CBC today.    Syncope, unspecified syncope type:  No further syncope.  Symptomatic bradycardia:  He is not on AV  nodal blocking agents. His heart rate is currently controlled.  ILR was interrogated today.  He had one brady episode with HR in the 40s last week.  No other arrhythmias noted.   Recurrent pulmonary embolism:  Coumadin is managed by primary care.  Coronary artery disease:  No angina.  Continue aspirin, Amlodipine, statin. No beta blocker given history of bradycardia.  HLD (hyperlipidemia):  Continue statin.  Essential hypertension, benign:  BP low today.  Adjust lasix as noted.  I will give her parameters to hold the patient's Norvasc if his SBP is 115 or less.    Cardiomyopathy:  EF 40-45% by recent echocardiogram. He is not on beta blocker due to history of bradycardia. He was placed on amlodipine given intramyocardial bridging.  He is off of ACE inhibitor due to low BP.  Alzheimer's dementia:  Follow-up with neurology as planned.    Medication Changes: Current medicines are reviewed at length with the patient today.  Concerns regarding medicines are as outlined above.  The following changes have been made:   Discontinued Medications   ACETAMINOPHEN (TYLENOL) 325 MG TABLET    Take 2 tablets (650 mg total) by mouth every 4 (four) hours as needed for headache or mild pain.   Modified Medications   Modified Medication Previous Medication   FUROSEMIDE (LASIX) 20 MG TABLET furosemide (LASIX) 20 MG tablet      Take 1 tablet (20 mg total) by mouth daily.    Take 1 tablet (20 mg total) by mouth 2 (two) times daily.   New Prescriptions   No medications on file    Labs/ tests ordered today include:   Orders Placed This Encounter  Procedures  . Basic Metabolic Panel (BMET)  . CBC w/Diff  . EKG 12-Lead     Disposition:   FU with Dr. [name] in 05/2015 and Dr. Ena Dawley in 07/2015.    Signed, Versie Starks, MHS 04/14/2015 2:47 PM    [address], [place], [place]  [address] Phone: [phone]; Fax: [phone]

## 2015-04-14 ENCOUNTER — Encounter: Payer: Self-pay | Admitting: Internal Medicine

## 2015-04-14 ENCOUNTER — Ambulatory Visit (INDEPENDENT_AMBULATORY_CARE_PROVIDER_SITE_OTHER): Payer: Commercial Managed Care - HMO | Admitting: *Deleted

## 2015-04-14 ENCOUNTER — Ambulatory Visit (INDEPENDENT_AMBULATORY_CARE_PROVIDER_SITE_OTHER): Payer: Commercial Managed Care - HMO | Admitting: Physician Assistant

## 2015-04-14 ENCOUNTER — Telehealth: Payer: Self-pay | Admitting: *Deleted

## 2015-04-14 ENCOUNTER — Encounter: Payer: Self-pay | Admitting: Physician Assistant

## 2015-04-14 VITALS — BP 90/50 | HR 77 | Ht 70.0 in | Wt 155.8 lb

## 2015-04-14 DIAGNOSIS — G309 Alzheimer's disease, unspecified: Secondary | ICD-10-CM

## 2015-04-14 DIAGNOSIS — I10 Essential (primary) hypertension: Secondary | ICD-10-CM

## 2015-04-14 DIAGNOSIS — R55 Syncope and collapse: Secondary | ICD-10-CM

## 2015-04-14 DIAGNOSIS — I429 Cardiomyopathy, unspecified: Secondary | ICD-10-CM | POA: Diagnosis not present

## 2015-04-14 DIAGNOSIS — I5022 Chronic systolic (congestive) heart failure: Secondary | ICD-10-CM

## 2015-04-14 DIAGNOSIS — F0280 Dementia in other diseases classified elsewhere without behavioral disturbance: Secondary | ICD-10-CM

## 2015-04-14 DIAGNOSIS — R001 Bradycardia, unspecified: Secondary | ICD-10-CM | POA: Diagnosis not present

## 2015-04-14 DIAGNOSIS — E785 Hyperlipidemia, unspecified: Secondary | ICD-10-CM

## 2015-04-14 DIAGNOSIS — I5023 Acute on chronic systolic (congestive) heart failure: Secondary | ICD-10-CM

## 2015-04-14 DIAGNOSIS — I2699 Other pulmonary embolism without acute cor pulmonale: Secondary | ICD-10-CM

## 2015-04-14 LAB — CUP PACEART INCLINIC DEVICE CHECK
Date Time Interrogation Session: 20160725124618
MDC IDC SET ZONE DETECTION INTERVAL: 3000 ms
MDC IDC SET ZONE DETECTION INTERVAL: 400 ms
Zone Setting Detection Interval: 2000 ms

## 2015-04-14 LAB — BASIC METABOLIC PANEL
BUN: 14 mg/dL (ref 6–23)
CO2: 29 mEq/L (ref 19–32)
Calcium: 9.4 mg/dL (ref 8.4–10.5)
Chloride: 104 mEq/L (ref 96–112)
Creatinine, Ser: 1.07 mg/dL (ref 0.40–1.50)
GFR: 85.08 mL/min (ref >60.00)
Glucose, Bld: 86 mg/dL (ref 70–99)
Potassium: 3.4 mEq/L — ABNORMAL LOW (ref 3.5–5.1)
Sodium: 142 mEq/L (ref 135–145)

## 2015-04-14 LAB — CBC WITH DIFFERENTIAL/PLATELET
BASOS PCT: 0.7 % (ref 0.0–3.0)
Basophils Absolute: 0 10*3/uL (ref 0.0–0.1)
EOS ABS: 0.1 10*3/uL (ref 0.0–0.7)
Eosinophils Relative: 3 % (ref 0.0–5.0)
HEMATOCRIT: 37.3 % — AB (ref 39.0–52.0)
Hemoglobin: 12 g/dL — ABNORMAL LOW (ref 13.0–17.0)
LYMPHS ABS: 1.1 10*3/uL (ref 0.7–4.0)
Lymphocytes Relative: 36.3 % (ref 12.0–46.0)
MCHC: 32.2 g/dL (ref 30.0–36.0)
MCV: 89 fl (ref 78.0–100.0)
Monocytes Absolute: 0.2 10*3/uL (ref 0.1–1.0)
Monocytes Relative: 7.2 % (ref 3.0–12.0)
NEUTROS PCT: 52.8 % (ref 43.0–77.0)
Neutro Abs: 1.6 10*3/uL (ref 1.4–7.7)
Platelets: 150 10*3/uL (ref 150.0–400.0)
RBC: 4.19 Mil/uL — AB (ref 4.22–5.81)
RDW: 17.7 % — AB (ref 11.5–15.5)
WBC: 3.1 10*3/uL — ABNORMAL LOW (ref 4.0–10.5)

## 2015-04-14 MED ORDER — POTASSIUM CHLORIDE ER 10 MEQ PO TBCR: 10.0000 meq | EXTENDED_RELEASE_TABLET | ORAL | Status: DC

## 2015-04-14 MED ORDER — FUROSEMIDE 20 MG PO TABS: 20.0000 mg | ORAL_TABLET | Freq: Every day | ORAL | Status: DC

## 2015-04-14 NOTE — Telephone Encounter (Signed)
S/w pt's daughter Fredda Hammed on file; [name] notified of lab results and to start K+ 10 meq with the 1st dose being 20 meq, bmet 8/3. [name] verbalized understanding to plan of care.

## 2015-04-14 NOTE — Patient Instructions (Addendum)
Medication Instructions:  1. DECREASE LASIX TO 20 MG DAILY  Labwork: TODAY BMET, CBC W/DIFF  Testing/Procedures: NONE  Follow-Up: 1. NEED TO SEE DR. KLEIN 05/2015  2. NEED TO SEE DR. Meda Coffee 07/2015  Any Other Special Instructions Will Be Listed Below (If Applicable). 1. GET OTC COMPRESSION SOCKS AND WEAR DAILY 2. WEIGH DAILY; IF YOUR WEIGHT IS UP 3 LB'S IN 1 DAY OR MORE SWELLING THEN TAKE EXTRA LASIX 20 MG 3. IF BLOOD SYSTOLIC PRESSURE (TOP NUMBER) IS BELOW 115 THEN HOLD AMLODIPINE

## 2015-04-14 NOTE — Progress Notes (Signed)
Add on LINQ check in clinic for c/o low HR this morning per daughter. R waves 1 mV, battery: good. 1 brady episode- EGM shows undersensing bigeminal PVCs. ROV with SK 06/23/15.

## 2015-04-15 ENCOUNTER — Ambulatory Visit (INDEPENDENT_AMBULATORY_CARE_PROVIDER_SITE_OTHER): Payer: Commercial Managed Care - HMO

## 2015-04-15 ENCOUNTER — Ambulatory Visit (INDEPENDENT_AMBULATORY_CARE_PROVIDER_SITE_OTHER): Payer: Commercial Managed Care - HMO | Admitting: Internal Medicine

## 2015-04-15 ENCOUNTER — Encounter: Payer: Self-pay | Admitting: Internal Medicine

## 2015-04-15 VITALS — BP 125/86 | HR 66 | Temp 98.6°F | Ht 70.0 in | Wt 155.8 lb

## 2015-04-15 DIAGNOSIS — R55 Syncope and collapse: Secondary | ICD-10-CM

## 2015-04-15 DIAGNOSIS — F32A Depression, unspecified: Secondary | ICD-10-CM

## 2015-04-15 DIAGNOSIS — I5022 Chronic systolic (congestive) heart failure: Secondary | ICD-10-CM

## 2015-04-15 DIAGNOSIS — G309 Alzheimer's disease, unspecified: Secondary | ICD-10-CM | POA: Diagnosis not present

## 2015-04-15 DIAGNOSIS — D72819 Decreased white blood cell count, unspecified: Secondary | ICD-10-CM | POA: Diagnosis not present

## 2015-04-15 DIAGNOSIS — I10 Essential (primary) hypertension: Secondary | ICD-10-CM | POA: Diagnosis not present

## 2015-04-15 DIAGNOSIS — R296 Repeated falls: Secondary | ICD-10-CM

## 2015-04-15 DIAGNOSIS — Z9181 History of falling: Secondary | ICD-10-CM

## 2015-04-15 DIAGNOSIS — F0280 Dementia in other diseases classified elsewhere without behavioral disturbance: Secondary | ICD-10-CM

## 2015-04-15 DIAGNOSIS — K5900 Constipation, unspecified: Secondary | ICD-10-CM | POA: Diagnosis not present

## 2015-04-15 DIAGNOSIS — R001 Bradycardia, unspecified: Secondary | ICD-10-CM

## 2015-04-15 DIAGNOSIS — D649 Anemia, unspecified: Secondary | ICD-10-CM | POA: Diagnosis not present

## 2015-04-15 DIAGNOSIS — F329 Major depressive disorder, single episode, unspecified: Secondary | ICD-10-CM

## 2015-04-15 DIAGNOSIS — B351 Tinea unguium: Secondary | ICD-10-CM | POA: Insufficient documentation

## 2015-04-15 LAB — SAVE SMEAR

## 2015-04-15 NOTE — Patient Instructions (Addendum)
1. I will call regarding your labs.  I am referring you for gait training to help prevent falls.  I will place the requested referrals to podiatry, cardiology and neurology.  Please come back to see me in 3 months or sooner if problems.   2. Please take all medications as prescribed.    3. If you have worsening of your symptoms or new symptoms arise, please call the clinic ([phone]), or go to the ER immediately if symptoms are severe.

## 2015-04-15 NOTE — Patient Outreach (Signed)
[place] [facility]) Care Management  04/15/2015  [name] [name] 1931/08/06 [id number]   Notification from [facility], LCSW that SW is closing, notification sent to [facility], Therapist, sports.  Ronnell Freshwater. [place], [place] Management [place] Assistant Phone: [phone] Fax: [phone]

## 2015-04-15 NOTE — Progress Notes (Signed)
   Subjective:    Patient ID: [name] [name], male    DOB: Apr 04, 1931, 81 y.o.   MRN: [id number]  HPI Comments: Mr. Schmid is an 81 year old male with PMH as below here for follow-up.  Please see problem based charting for assessment and plan.     Review of Systems  Constitutional: Negative for fever, chills, appetite change and unexpected weight change.  Respiratory: Negative for shortness of breath.        Denies DOE, PND or orthopnea.  Cardiovascular: Positive for leg swelling. Negative for chest pain.  Gastrointestinal: Positive for abdominal pain and constipation. Negative for nausea, vomiting, diarrhea and blood in stool.       Constipation and occasional abd pain x 2 years  Genitourinary: Negative for difficulty urinating.  Neurological: Positive for light-headedness. Negative for dizziness and syncope.       Felt lightheaded when HR was in 70s yesterday.  [place] about 3 times since our last visit, daughter describes mechanical falls.         Filed Vitals:   04/15/15 1429  BP: 125/86  Pulse: 66  Temp: 98.6 F (37 C)  TempSrc: Oral  Height: 5\' 10"  (1.778 m)  Weight: 155 lb 12.8 oz (70.67 kg)  SpO2: 100%     Objective:   Physical Exam  Constitutional: He is oriented to person, place, and time. He appears well-developed. No distress.  HENT:  Head: Normocephalic and atraumatic.  Mouth/Throat: Oropharynx is clear and moist. No oropharyngeal exudate.  Eyes: EOM are normal. Pupils are equal, round, and reactive to light.  Neck: Neck supple.  Cardiovascular: Normal rate, regular rhythm and normal heart sounds.  Exam reveals no gallop and no friction rub.   No murmur heard. Pulmonary/Chest: Effort normal and breath sounds normal. No respiratory distress. He has no wheezes. He has no rales.  Abdominal: Soft. Bowel sounds are normal. He exhibits no distension and no mass. There is no tenderness. There is no rebound and no guarding.  Musculoskeletal: Normal range of motion.  He exhibits no edema or tenderness.  Neurological: He is alert and oriented to person, place, and time. No cranial nerve deficit.  Skin: Skin is warm. He is not diaphoretic.  Psychiatric: He has a normal mood and affect. His behavior is normal.  Vitals reviewed.         Assessment & Plan:  Please see problem based charting for assessment and plan.

## 2015-04-16 DIAGNOSIS — R296 Repeated falls: Secondary | ICD-10-CM | POA: Insufficient documentation

## 2015-04-16 DIAGNOSIS — I5022 Chronic systolic (congestive) heart failure: Secondary | ICD-10-CM | POA: Insufficient documentation

## 2015-04-16 DIAGNOSIS — D72819 Decreased white blood cell count, unspecified: Secondary | ICD-10-CM | POA: Insufficient documentation

## 2015-04-16 DIAGNOSIS — D649 Anemia, unspecified: Secondary | ICD-10-CM | POA: Insufficient documentation

## 2015-04-16 LAB — HEPATIC FUNCTION PANEL
ALK PHOS: 53 U/L (ref 40–115)
ALT: 21 U/L (ref 9–46)
AST: 36 U/L — ABNORMAL HIGH (ref 10–35)
Albumin: 3.8 g/dL (ref 3.6–5.1)
BILIRUBIN TOTAL: 0.5 mg/dL (ref 0.2–1.2)
Bilirubin, Direct: 0.1 mg/dL (ref <=0.2)
Indirect Bilirubin: 0.4 mg/dL (ref 0.2–1.2)
Total Protein: 7.1 g/dL (ref 6.1–8.1)

## 2015-04-16 LAB — LACTATE DEHYDROGENASE: LDH: 247 U/L (ref 94–250)

## 2015-04-16 LAB — ANEMIA PANEL
%SAT: 22 % (ref 20–55)
ABS RETIC: 26 10*3/uL (ref 19.0–186.0)
Ferritin: 53 ng/mL (ref 22–322)
Folate: >20 ng/mL
Iron: 70 ug/dL (ref 42–165)
RBC.: 4.33 MIL/uL (ref 4.22–5.81)
RETIC CT PCT: 0.6 % (ref 0.4–2.3)
TIBC: 315 ug/dL (ref 215–435)
UIBC: 245 ug/dL (ref 125–400)
Vitamin B-12: 573 pg/mL (ref 211–911)

## 2015-04-16 LAB — HAPTOGLOBIN: Haptoglobin: 142 mg/dL (ref 43–212)

## 2015-04-16 NOTE — Assessment & Plan Note (Signed)
Previously on Zoloft (for about 8 months).  Daughter says it was stopped since there was no response.  He spends a lot of time in his room but he does have social interaction at [facility] every Sunday.  He denies SI.  He has declined Adult Day Care opportunities in the past and does not seem interested today.

## 2015-04-16 NOTE — Assessment & Plan Note (Signed)
WBC 3.1 at cards visit yesterday and he was advised to follow-up with me.  Diff is normal with ANC of 1600.  No recent infection.  Review of EPIC shows his WBC has always been 2.5-5 (going back to 2010).  I suspect his normal value is likely lower than lab normal since his WBC has persistently been low with no acute change, no sign of infection, diff normal.  Does not seem med related.  He has been persistently anemic, but normal PLT and I doubt bone marrow suppression. - monitor WBC periodically - working up anemia

## 2015-04-16 NOTE — Assessment & Plan Note (Signed)
Daughter reports 3 falls since I last saw him.  He is unsure if they coincide with lightheadedness.  He denies chest pain during the events.  Daughter thinks he trips or sometimes misses the chair as he is sitting down.  I suspect they are related to his bradykinesia/Parkinsonism. - referral to neurorehab for gait training

## 2015-04-16 NOTE — Assessment & Plan Note (Addendum)
No further syncope or CP since admission last March.  LINQ in place.  He was seen by Cardiology yesterday.  Device shows one episode of brady in the 11s, no other arrhythmias.  HR and BP normal today.  On low dose amlodipine only.

## 2015-04-16 NOTE — Assessment & Plan Note (Addendum)
Weight stable, asymptomatic.  HR cannot tolerate BB.  ACEI d/c due to low BP.  Lasix dose decreased to 20mg  daily at recent Cards visit.  Daughter purchased a scale so he can weigh daily.

## 2015-04-16 NOTE — Assessment & Plan Note (Addendum)
BP Readings from Last 3 Encounters:  04/15/15 125/86  04/14/15 90/50  04/08/15 118/74    Lab Results  Component Value Date   NA 142 04/14/2015   K 3.4* 04/14/2015   CREATININE 1.07 04/14/2015    Assessment: Blood pressure control:  well controlled  Progress toward BP goal:   at goal Comments: Compliant with meds.  On low dose amlodipine only (hx of symptomatic brady and low BP).    Plan: Medications:  continue current medications:  Amlodipine 2.5mg  daily; cards advised he hold med for SBP < 115. Educational resources provided: brochure (denies) Self management tools provided:   Other plans: RTC in 3 months

## 2015-04-16 NOTE — Assessment & Plan Note (Signed)
Stable on Namenda.  He will see Dr. Leonie Man next month.

## 2015-04-16 NOTE — Assessment & Plan Note (Addendum)
Hgb generally 11-12.  No blood loss noted.  He has never had colonoscopy and daughter is concerned he may need to be admitted for it if he needed one because of dementia.  No anemia panel in system.  He does have constipation but renal function and calcium normal making MM less likely.  TSH normal four months ago.  Daughter says they would be interested in pursuing treatments if serious illness is uncovered (such as malignancy) so will plan full work-up. - stool cards for FOBT (daughter will return them asap); consider colonoscopy if positive  - anemia panel, LDH, haptoglobin, liver function (tbili), SPEP (I forgot to collect urine for UPEP but SPEP should suffice) - review peripheral smear - PSA next visit (surgical trx for prostate CA 19 years ago at [facility]; I do not see recent PSA in EPIC but chart review mentions normal PSA in 2014)  07/27 addendum:  Peripheral smear reviewed with Dr. Beryle Beams (Heme/Onc) and is bland.  There is no evidence of myelodysplasia or hemolysis.

## 2015-04-16 NOTE — Assessment & Plan Note (Signed)
Daughter says he tends to limit water to avoid frequent bathroom trips (also on Lasix).  Also not as mobile due to dementia, bradykinesia.  Advised to try colace and miralax.

## 2015-04-18 LAB — SPEP & IFE WITH QIG
ALPHA-2-GLOBULIN: 0.8 g/dL (ref 0.5–0.9)
Albumin ELP: 3.8 g/dL (ref 3.8–4.8)
Alpha-1-Globulin: 0.3 g/dL (ref 0.2–0.3)
Beta 2: 0.4 g/dL (ref 0.2–0.5)
Beta Globulin: 0.4 g/dL (ref 0.4–0.6)
GAMMA GLOBULIN: 1.4 g/dL (ref 0.8–1.7)
IGA: 247 mg/dL (ref 68–379)
IgG (Immunoglobin G), Serum: 1460 mg/dL (ref 650–1600)
IgM, Serum: 69 mg/dL (ref 41–251)
Total Protein, Serum Electrophoresis: 7.1 g/dL (ref 6.1–8.1)

## 2015-04-18 NOTE — Progress Notes (Signed)
Internal Medicine Clinic Attending  Case discussed with Dr. Wilson soon after the resident saw the patient.  We reviewed the resident's history and exam and pertinent patient test results.  I agree with the assessment, diagnosis, and plan of care documented in the resident's note.  

## 2015-04-21 ENCOUNTER — Encounter: Payer: Self-pay | Admitting: Internal Medicine

## 2015-04-23 ENCOUNTER — Telehealth: Payer: Self-pay

## 2015-04-23 ENCOUNTER — Encounter: Payer: Self-pay | Admitting: Neurology

## 2015-04-23 ENCOUNTER — Other Ambulatory Visit (INDEPENDENT_AMBULATORY_CARE_PROVIDER_SITE_OTHER): Payer: Commercial Managed Care - HMO

## 2015-04-23 ENCOUNTER — Ambulatory Visit: Payer: Commercial Managed Care - HMO | Admitting: Neurology

## 2015-04-23 ENCOUNTER — Ambulatory Visit (INDEPENDENT_AMBULATORY_CARE_PROVIDER_SITE_OTHER): Payer: Commercial Managed Care - HMO | Admitting: Neurology

## 2015-04-23 VITALS — BP 148/94 | HR 83 | Ht 68.0 in | Wt 156.0 lb

## 2015-04-23 DIAGNOSIS — I10 Essential (primary) hypertension: Secondary | ICD-10-CM

## 2015-04-23 DIAGNOSIS — G238 Other specified degenerative diseases of basal ganglia: Secondary | ICD-10-CM

## 2015-04-23 DIAGNOSIS — I5023 Acute on chronic systolic (congestive) heart failure: Secondary | ICD-10-CM

## 2015-04-23 DIAGNOSIS — G232 Striatonigral degeneration: Secondary | ICD-10-CM

## 2015-04-23 LAB — BASIC METABOLIC PANEL
BUN: 15 mg/dL (ref 6–23)
CALCIUM: 9.5 mg/dL (ref 8.4–10.5)
CO2: 30 mEq/L (ref 19–32)
Chloride: 105 mEq/L (ref 96–112)
Creatinine, Ser: 1.11 mg/dL (ref 0.40–1.50)
GFR: 81.55 mL/min (ref >60.00)
GLUCOSE: 96 mg/dL (ref 70–99)
Potassium: 3.9 mEq/L (ref 3.5–5.1)
SODIUM: 142 meq/L (ref 135–145)

## 2015-04-23 NOTE — Telephone Encounter (Signed)
LVM for [name], pts daughter.  Dr. Leonie Man suggest only taking Levadopa 2 times daily instead of 3 times daily due to patients blood pressure dropping.  She has been told to call office back with any questions.

## 2015-04-23 NOTE — Progress Notes (Signed)
Loop recorder 

## 2015-04-23 NOTE — Progress Notes (Signed)
[name] DOB: 1931-05-05  REASON FOR VISIT: routine follow up for Alzheimer's and Parkinson's HISTORY FROM: patient  HISTORY OF PRESENT ILLNESS: UPDATE 01/23/14 (LL):  MMSE today is 23/30, AFT 9 and Clock drawing 1/4. He is tolerating Sinemet tid and Namenda well.  Mood is better on Zoloft. Having difficulty sleeping.  Does not nap during the day.  Has difficulty getting to sleep and wakes frequently.  Has tried Melatonin in the past without benefit.  He used Nortriptyline in the past for insomnia and pain, but at 100 mg dose became toxic (unresponsive).  Daughter notices "sundowning" at night, more confused in nighttime hours.   UPDATE 06/23/13 (LL): [name] [name] comes back for 2 month revisit. Last visit he was started on Sinemet to see if he would have any benefit. Both he and his daughter think that he is moving better since on it. Daughter states that he is no longer "freezing" when he tries to take a step. He is currently taking it twice a day, once when he gets up (around 7-8) and then again at 5 pm. He has no new complaints. MMSE today is 27/30, AFT 8 and Clock drawing 1/4.   UPDATE 05/24/13 (LL): Patient returns for followup. After starting Aricept at last visit daughter reports that he became very weak, confused, and had to be hospitalized. Was found to be bradycardic and symptomatic with heart rate in the 40s, attributable to the Aricept. Medication was stopped and patient was discharged to rehabilitation for short time and has since returned home. Daughter states that he has adjusted somewhat to having moved in with her, is taking Zoloft for depression, and has disinterest in getting out of the house much. States that sometimes it is hard for him to pick up his foot to take a step and it seems to be when he is nervous. Also seems to have tremor when nervous. Has had trouble with insomnia was started on melatonin 5 mg at bedtime with no response. Daughter feels like he has  improved some in his cognitive ability since last visit. MMSE last visit was 23/30. MMSE today is 25/30 with deficits in recall. Clock drawing 0/4. AFT 10. Geriatric depression score is 3 does not suggest depression.   PRIOR HPI 02/05/13 (PS): 80 male with remote right hemispheric TIA in May 2011 and mild cognitive impairment. He returns today for followup after his last visit with me on 01/05/2012. Continues to do well from a neurovascular standpoint and has not had a any further stroke or TIAs. He remains on warfarin and is tolerating it well without significant bleeding, bruising or the side effects. He states his blood pressure is under good control and it is 124/76 in office today. He however is had increasing confusion and memory difficulties for the last 1 year which have been progressive. He now needs help with taking his medications. He has stopped going out. He in fact wandered out a few weeks ago from his home and needed help to get back. He denies any agitation or caval changes. He has no significant hallucination or other lesions. His gait and balance are fine and there have been no safety issues. He has now moved and to live with his daughter for the last 2-3 months. The patient did undergo labs for treatable causes of cognitive impairment at last visit and vitamin B12, TSH and RPR were normal on 01/05/2012. His mother did have Alzheimer's.  Update 10/23/2014 : He returns for follow-up after  last visit 9 months ago. He is accompanied by his daughter who provides most of the history. Patient feels he is about the same and the daughter agrees. He continues to have poor short-term memory and needs help with activities like his medications and food. He can dress himself and uses a toilet on most occasions with only occasional help. He sometimes has trouble walking and states that his legs don't move but once he gets started he can walk fairly well. He walks with a stooped posture but his balance is not bad  and he is not had any recent falls. He remains on Namenda XR 28 mg daily which he is tolerating well. He had been on Aricept for years but developed slow heart rate requiring it to be stopped. He has had no interval health problems. He hasn't not been having any significant hallucinations, delusions, agitation or safety concerns. He does get anxious occasionally. The patient's other daughter wants him and his wife to come and stay with her for 3 months and the family is contemplating whether that would be a good move or not Update 04/23/2015 : He returns for follow-up after last visit 6 months ago. He is accompanied by his daughter. The patient was hospitalized in March with syncopal episodes with hypertension and low heart rate. His blood pressure medications were adjusted and tapered and lisinopril and Lasix were discontinued. He undergo a heart monitor for a few days. He is doing better now but blood pressure running in the 536R systolic range. However his been feeling weak all over as has not been walking a lot. He has not been using his walker. He feels his leg is slow and stiff and stick to the ground. He is walking difficulty to the more pronounced when is anxious. He has noticed previously some benefit after adding Sinemet which he takes 25/100 one tablet 3 times daily. Daughter has not noticed any significant further cognitive decline however on Mini-Mental status exam testing today she scored 17 out of 30 which is a significant drop from  22/30 at last visit. He remains on Namenda etc. and in the past has not been able to tolerate Aricept due to his heart rate being slow to REVIEW OF SYSTEMS: Full 14 system review of systems performed and notable only for:  Back pain, walking difficulty, weakness, syncope, bradycardia, anxious, nervousness, leg swelling and all other systems negative   ALLERGIES: Allergies  Allergen Reactions  . Aricept [Donepezil Hcl] Other (See Comments)    Symptomatic  Bradycardia.  . Other Other (See Comments)    Shrimp gives him gout    HOME MEDICATIONS: Outpatient Prescriptions Prior to Visit  Medication Sig Dispense Refill  . acetaminophen (TYLENOL) 500 MG tablet Take 500 mg by mouth every 4 (four) hours as needed for mild pain or headache.    [name] amLODipine (NORVASC) 2.5 MG tablet Take 1 tablet (2.5 mg total) by mouth daily. 30 tablet 6  . aspirin EC 81 MG EC tablet Take 1 tablet (81 mg total) by mouth daily.    [name] atorvastatin (LIPITOR) 40 MG tablet TAKE 1 TABLET (40 MG TOTAL) BY MOUTH EVERY EVENING. 90 tablet 1  . b complex vitamins tablet Take 1 tablet by mouth daily.    . Calcium Carbonate-Vitamin D (CALCIUM-VITAMIN D) 500-200 MG-UNIT per tablet Take 1 tablet by mouth daily.    . carbidopa-levodopa (SINEMET IR) 25-100 MG per tablet Take 1 tablet by mouth 2 (two) times daily. 60 tablet 3  .  fish oil-omega-3 fatty acids 1000 MG capsule Take 2 g by mouth daily.     . furosemide (LASIX) 20 MG tablet Take 1 tablet (20 mg total) by mouth daily.    . Multiple Vitamins-Minerals (MULTIVITAMIN WITH MINERALS) tablet Take 1 tablet by mouth daily.    [name] NAMENDA XR 28 MG CP24 24 hr capsule TAKE 1 CAPSULE BY MOUTH DAILY 30 capsule 3  . omeprazole (PRILOSEC) 20 MG capsule TAKE 1 CAPSULE (20 MG TOTAL) BY MOUTH DAILY. 90 capsule 0  . potassium chloride (K-DUR) 10 MEQ tablet Take 1 tablet (10 mEq total) by mouth as directed. 1st dose take 2 tabs = 20 meq then decrease to 1 tab = 10 meq daily 30 tablet 11  . warfarin (COUMADIN) 5 MG tablet Take 1&1/2 tablets on Mondays; Take 1 tablet all other days. (Patient taking differently: Take 5-7.5 mg by mouth daily. Take 1&1/2 tablets on Mondays; Take 1 tablet all other days.) 40 tablet 2  . zoster vaccine live, PF, (ZOSTAVAX) 36144 UNT/0.65ML injection Inject 19,400 Units into the skin once. (Patient not taking: Reported on 04/23/2015) 1 each 0   No facility-administered medications prior to visit.   PHYSICAL EXAM  Filed  Vitals:   04/23/15 1116  BP: 148/94  Pulse: 83  Height: 5\' 8"  (1.727 m)  Weight: 156 lb (70.761 kg)   Body mass index is 23.73 kg/(m^2).  Generalized: In no acute distress, pleasant  Frail elderly AA male, well groomed, well developed  Neck: Supple, no carotid bruits  Cardiac: Irregular rate and rhythm, soft 1/6 murmur  Pulmonary: Clear to auscultation bilaterally  Musculoskeletal: mild kyphosis   Neurological examination  MMSE history: 04/23/2015 : MMSE 17/30 10/23/14 MMSE 22/30, AFT 11, Clock 1/4, GDS 5 01/23/14:   MMSE 23/30, recall 1/3, AFT 9 and Clock Drawing 1/4. 06/23/13:  MMSE 27/30, recall 3/3, AFT 8 and Clock drawing 1/4. GDS 5. 02/05/13:  MMSE 25/30, recall 1/3, AFT 10, Clock drawing 0/4. GDS 3.  Mentation: Alert, language fluent. Mini-Mental status exam scored 17/30. Deficits in orientation, attention, recall and following three-step commands. Animal naming 4 only. Mood and affect appropriate. MASKED FACIES.   Cranial nerve II-XII: Pupils were equal round reactive to light extraocular movements were full, visual field were full on confrontational test. facial sensation and strength were normal. hearing was intact to finger rubbing bilaterally. Uvula tongue midline.   MOTOR: normal bulk and tone, full strength in the BUE, BLE, fine finger movements normal, no pronator drift. MILD COGWHEEL RIGIDIITY IN ARMS AND WRISTS, L>R.  SENSORY: normal and symmetric to light touch  COORDINATION: Normal finger-nose-finger, heel-to-shin bilaterally.  REFLEXES: 1+ and symmetric. POSITIVE MYERSON'S, POSITIVE SNOUT, NEGATIVE PALMOMENTAL.  GAIT/STATION: Rising up from seated position without assistance, STOOPED STANCE, without truncal ataxia, moderate stride, POOR ARM SWING ON RIGHT, TURNS IN 3 STEPS, UNABLE to perform tiptoe, and heel walking without difficulty. Romberg negative.  ASSESSMENT AND PLAN 81 year old AA male patient with mild cognitive impairment which appears to progressed to mild  dementia. Likely Alzheimer`s type. Remote history of right brain TIA in May 2011 and stable from a neurovascular standpoint. Aricept caused bradycardia. 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 I'm reluctant to increase his Sinemet at the present time but he may benefit by a referral to Dr. Kelli Churn movement disorder specialist to help with medication management and possibly consideration of Northera . He was encouraged to ambulate with assistance using a walker and follow fall prevention precautions. He will continue Namenda XR 28 mg daily. He was also advised fall prevention and safety precautions. He'll return for follow-up in the future only as necessary.  [name], MD  04/23/2015, 8:45 PM [facility] [address] [place], [place] [address] [phone]  Note: This document was prepared with digital dictation and possible smart phrase technology. Any transcriptional errors that result from this process are unintentional.

## 2015-04-23 NOTE — Patient Instructions (Signed)
I had a long discussion with the patient and daughter regarding his parkinsonian symptoms with the worsening cognitive impairment as well as recent autonomic instability with low blood pressure and heart rate issues. I'm reluctant to increase his Sinemet at the present time but he may benefit by a referral to Dr. Kelli Churn movement disorder specialist to help with medication management and possibly consideration of Northera . He was encouraged to ambulate with assistance using a walker and follow fall prevention precautions. He will continue Namenda XR 28 mg daily He'll return for follow-up in the future only as necessary.

## 2015-04-28 ENCOUNTER — Telehealth: Payer: Self-pay | Admitting: Internal Medicine

## 2015-04-28 ENCOUNTER — Encounter (HOSPITAL_COMMUNITY): Payer: Self-pay | Admitting: *Deleted

## 2015-04-28 ENCOUNTER — Emergency Department (HOSPITAL_COMMUNITY)
Admission: EM | Admit: 2015-04-28 | Discharge: 2015-04-28 | Disposition: A | Discharge status: Home / Self Care | Payer: Commercial Managed Care - HMO | Attending: Emergency Medicine | Admitting: Emergency Medicine

## 2015-04-28 ENCOUNTER — Emergency Department (HOSPITAL_COMMUNITY): Payer: Commercial Managed Care - HMO

## 2015-04-28 ENCOUNTER — Encounter: Payer: Commercial Managed Care - HMO | Admitting: Internal Medicine

## 2015-04-28 DIAGNOSIS — F0390 Unspecified dementia without behavioral disturbance: Secondary | ICD-10-CM | POA: Insufficient documentation

## 2015-04-28 DIAGNOSIS — Y92002 Bathroom of unspecified non-institutional (private) residence single-family (private) house as the place of occurrence of the external cause: Secondary | ICD-10-CM | POA: Insufficient documentation

## 2015-04-28 DIAGNOSIS — W19XXXA Unspecified fall, initial encounter: Secondary | ICD-10-CM | POA: Insufficient documentation

## 2015-04-28 DIAGNOSIS — S01112A Laceration without foreign body of left eyelid and periocular area, initial encounter: Secondary | ICD-10-CM | POA: Diagnosis not present

## 2015-04-28 DIAGNOSIS — I2510 Atherosclerotic heart disease of native coronary artery without angina pectoris: Secondary | ICD-10-CM | POA: Insufficient documentation

## 2015-04-28 DIAGNOSIS — Z9889 Other specified postprocedural states: Secondary | ICD-10-CM | POA: Insufficient documentation

## 2015-04-28 DIAGNOSIS — Z79899 Other long term (current) drug therapy: Secondary | ICD-10-CM | POA: Diagnosis not present

## 2015-04-28 DIAGNOSIS — Z8659 Personal history of other mental and behavioral disorders: Secondary | ICD-10-CM | POA: Diagnosis not present

## 2015-04-28 DIAGNOSIS — E785 Hyperlipidemia, unspecified: Secondary | ICD-10-CM | POA: Diagnosis not present

## 2015-04-28 DIAGNOSIS — S0990XA Unspecified injury of head, initial encounter: Secondary | ICD-10-CM | POA: Diagnosis not present

## 2015-04-28 DIAGNOSIS — Y999 Unspecified external cause status: Secondary | ICD-10-CM | POA: Insufficient documentation

## 2015-04-28 DIAGNOSIS — Z8546 Personal history of malignant neoplasm of prostate: Secondary | ICD-10-CM | POA: Diagnosis not present

## 2015-04-28 DIAGNOSIS — I10 Essential (primary) hypertension: Secondary | ICD-10-CM | POA: Insufficient documentation

## 2015-04-28 DIAGNOSIS — Z85858 Personal history of malignant neoplasm of other endocrine glands: Secondary | ICD-10-CM | POA: Insufficient documentation

## 2015-04-28 DIAGNOSIS — S0592XA Unspecified injury of left eye and orbit, initial encounter: Secondary | ICD-10-CM | POA: Diagnosis present

## 2015-04-28 DIAGNOSIS — Z86711 Personal history of pulmonary embolism: Secondary | ICD-10-CM | POA: Insufficient documentation

## 2015-04-28 DIAGNOSIS — Y9389 Activity, other specified: Secondary | ICD-10-CM | POA: Insufficient documentation

## 2015-04-28 DIAGNOSIS — S0003XA Contusion of scalp, initial encounter: Secondary | ICD-10-CM | POA: Diagnosis not present

## 2015-04-28 DIAGNOSIS — Z7982 Long term (current) use of aspirin: Secondary | ICD-10-CM | POA: Insufficient documentation

## 2015-04-28 DIAGNOSIS — Z7901 Long term (current) use of anticoagulants: Secondary | ICD-10-CM | POA: Diagnosis not present

## 2015-04-28 NOTE — Telephone Encounter (Signed)
Return call to daughter - N/A - left message there was an appt today with Dr Gordy Levan 3:15PM. Daughter called again and decided to wait and see how he does. Pt fell in BR - had slippery socks on. Pt turned and went down. Pt was seen ER early this AM.

## 2015-04-28 NOTE — Discharge Instructions (Signed)
Return to the emergency department for severe headache, difficulty waking, or other new and concerning symptoms.   Head Injury You have received a head injury. It does not appear serious at this time. Headaches and vomiting are common following head injury. It should be easy to awaken from sleeping. Sometimes it is necessary for you to stay in the emergency department for a while for observation. Sometimes admission to the hospital may be needed. After injuries such as yours, most problems occur within the first 24 hours, but side effects may occur up to 7-10 days after the injury. It is important for you to carefully monitor your condition and contact your health care provider or seek immediate medical care if there is a change in your condition. WHAT ARE THE TYPES OF HEAD INJURIES? Head injuries can be as minor as a bump. Some head injuries can be more severe. More severe head injuries include:  A jarring injury to the brain (concussion).  A bruise of the brain (contusion). This mean there is bleeding in the brain that can cause swelling.  A cracked skull (skull fracture).  Bleeding in the brain that collects, clots, and forms a bump (hematoma). WHAT CAUSES A HEAD INJURY? A serious head injury is most likely to happen to someone who is in a car wreck and is not wearing a seat belt. Other causes of major head injuries include bicycle or motorcycle accidents, sports injuries, and falls. HOW ARE HEAD INJURIES DIAGNOSED? A complete history of the event leading to the injury and your current symptoms will be helpful in diagnosing head injuries. Many times, pictures of the brain, such as CT or MRI are needed to see the extent of the injury. Often, an overnight hospital stay is necessary for observation.  WHEN SHOULD I SEEK IMMEDIATE MEDICAL CARE?  You should get help right away if:  You have confusion or drowsiness.  You feel sick to your stomach (nauseous) or have continued, forceful  vomiting.  You have dizziness or unsteadiness that is getting worse.  You have severe, continued headaches not relieved by medicine. Only take over-the-counter or prescription medicines for pain, fever, or discomfort as directed by your health care provider.  You do not have normal function of the arms or legs or are unable to walk.  You notice changes in the black spots in the center of the colored part of your eye (pupil).  You have a clear or bloody fluid coming from your nose or ears.  You have a loss of vision. During the next 24 hours after the injury, you must stay with someone who can watch you for the warning signs. This person should contact local emergency services (911 in the [place] if you have seizures, you become unconscious, or you are unable to wake up. HOW CAN I PREVENT A HEAD INJURY IN THE FUTURE? The most important factor for preventing major head injuries is avoiding motor vehicle accidents. To minimize the potential for damage to your head, it is crucial to wear seat belts while riding in motor vehicles. Wearing helmets while bike riding and playing collision sports (like football) is also helpful. Also, avoiding dangerous activities around the house will further help reduce your risk of head injury.  WHEN CAN I RETURN TO NORMAL ACTIVITIES AND ATHLETICS? You should be reevaluated by your health care provider before returning to these activities. If you have any of the following symptoms, you should not return to activities or contact sports until 1 week after the  symptoms have stopped:  Persistent headache.  Dizziness or vertigo.  Poor attention and concentration.  Confusion.  Memory problems.  Nausea or vomiting.  Fatigue or tire easily.  Irritability.  Intolerant of bright lights or loud noises.  Anxiety or depression.  Disturbed sleep. MAKE SURE YOU:   Understand these instructions.  Will watch your condition.  Will get help right away if you are  not doing well or get worse. Document Released: 09/06/2005 Document Revised: 09/11/2013 Document Reviewed: 05/14/2013 [facility] Patient Information 2015 [place], [place]. This information is not intended to replace advice given to you by your health care provider. Make sure you discuss any questions you have with your health care provider.  Laceration Care, Adult A laceration is a cut or lesion that goes through all layers of the skin and into the tissue just beneath the skin. TREATMENT  Some lacerations may not require closure. Some lacerations may not be able to be closed due to an increased risk of infection. It is important to see your caregiver as soon as possible after an injury to minimize the risk of infection and maximize the opportunity for successful closure. If closure is appropriate, pain medicines may be given, if needed. The wound will be cleaned to help prevent infection. Your caregiver will use stitches (sutures), staples, wound glue (adhesive), or skin adhesive strips to repair the laceration. These tools bring the skin edges together to allow for faster healing and a better cosmetic outcome. However, all wounds will heal with a scar. Once the wound has healed, scarring can be minimized by covering the wound with sunscreen during the day for 1 full year. HOME CARE INSTRUCTIONS  For sutures or staples:  Keep the wound clean and dry.  If you were given a bandage (dressing), you should change it at least once a day. Also, change the dressing if it becomes wet or dirty, or as directed by your caregiver.  Wash the wound with soap and water 2 times a day. Rinse the wound off with water to remove all soap. Pat the wound dry with a clean towel.  After cleaning, apply a thin layer of the antibiotic ointment as recommended by your caregiver. This will help prevent infection and keep the dressing from sticking.  You may shower as usual after the first 24 hours. Do not soak the wound in water  until the sutures are removed.  Only take over-the-counter or prescription medicines for pain, discomfort, or fever as directed by your caregiver.  Get your sutures or staples removed as directed by your caregiver. For skin adhesive strips:  Keep the wound clean and dry.  Do not get the skin adhesive strips wet. You may bathe carefully, using caution to keep the wound dry.  If the wound gets wet, pat it dry with a clean towel.  Skin adhesive strips will fall off on their own. You may trim the strips as the wound heals. Do not remove skin adhesive strips that are still stuck to the wound. They will fall off in time. For wound adhesive:  You may briefly wet your wound in the shower or bath. Do not soak or scrub the wound. Do not swim. Avoid periods of heavy perspiration until the skin adhesive has fallen off on its own. After showering or bathing, gently pat the wound dry with a clean towel.  Do not apply liquid medicine, cream medicine, or ointment medicine to your wound while the skin adhesive is in place. This may loosen the  film before your wound is healed.  If a dressing is placed over the wound, be careful not to apply tape directly over the skin adhesive. This may cause the adhesive to be pulled off before the wound is healed.  Avoid prolonged exposure to sunlight or tanning lamps while the skin adhesive is in place. Exposure to ultraviolet light in the first year will darken the scar.  The skin adhesive will usually remain in place for 5 to 10 days, then naturally fall off the skin. Do not pick at the adhesive film. You may need a tetanus shot if:  You cannot remember when you had your last tetanus shot.  You have never had a tetanus shot. If you get a tetanus shot, your arm may swell, get red, and feel warm to the touch. This is common and not a problem. If you need a tetanus shot and you choose not to have one, there is a rare chance of getting tetanus. Sickness from tetanus can  be serious. SEEK MEDICAL CARE IF:   You have redness, swelling, or increasing pain in the wound.  You see a red line that goes away from the wound.  You have yellowish-white fluid (pus) coming from the wound.  You have a fever.  You notice a bad smell coming from the wound or dressing.  Your wound breaks open before or after sutures have been removed.  You notice something coming out of the wound such as wood or glass.  Your wound is on your hand or foot and you cannot move a finger or toe. SEEK IMMEDIATE MEDICAL CARE IF:   Your pain is not controlled with prescribed medicine.  You have severe swelling around the wound causing pain and numbness or a change in color in your arm, hand, leg, or foot.  Your wound splits open and starts bleeding.  You have worsening numbness, weakness, or loss of function of any joint around or beyond the wound.  You develop painful lumps near the wound or on the skin anywhere on your body. MAKE SURE YOU:   Understand these instructions.  Will watch your condition.  Will get help right away if you are not doing well or get worse. Document Released: 09/06/2005 Document Revised: 11/29/2011 Document Reviewed: 03/02/2011 [facility] Patient Information 2015 [place], [place]. This information is not intended to replace advice given to you by your health care provider. Make sure you discuss any questions you have with your health care provider.

## 2015-04-28 NOTE — ED Notes (Signed)
The pt returned from c-t.  Daughter remains at the bedside

## 2015-04-28 NOTE — ED Notes (Signed)
The pt fell in the br at home 0300am.  He struck his head no loc.  Small lac lt eyebrow.  Pt is on coumadin.  No pain

## 2015-04-28 NOTE — Telephone Encounter (Signed)
Patients daughter states that her father fell about 2:00 am and went to er because he hit his head ct was done and pt is now home. States that the patient is now complaining when he moves at all. He was not hurting at the time this morning they did not do an xray of any other part of his body. He can walk but is in a lot of pain. Wants an appointment or to see if she should just take him back to er.

## 2015-04-28 NOTE — ED Provider Notes (Signed)
CSN: [id number]     Arrival date & time 04/28/15  0355 History   First MD Initiated Contact with Patient 04/28/15 0414     No chief complaint on file.    (Consider location/radiation/quality/duration/timing/severity/associated sxs/prior Treatment) HPI Comments: Patient is in a 81-year-old male with past medical history of pulmonary embolism on Coumadin, hypertension, and nonocclusive coronary artery disease. He presents for evaluation of a fall. He was getting up to go to the bathroom tonight and his daughter found him on the floor of the laundry room bleeding from above his left eye. Patient denies any headache, neck pain, or other discomfort.  Patient is a 81 y.o. male presenting with fall. The history is provided by the patient.  Fall This is a new problem. The current episode started less than 1 hour ago. The problem occurs constantly. The problem has not changed since onset.Nothing aggravates the symptoms. Nothing relieves the symptoms. He has tried nothing for the symptoms. The treatment provided no relief.    Past Medical History  Diagnosis Date  . PE (pulmonary embolism)     unprovoked PE completed 6 months of warfarin, warfarin d/ced 02/12/2010, repeat PE 07/10/2010 after a long car ride and now on lifelong coumadin.  [name] Hypertension   . Pituitary microadenoma     incidental finding CT 12/2009  . Prostate cancer   . Depression   . Hyperlipidemia   . Coronary artery disease, non-occlusive     with history of MI; Cath 2008 w multivessel nonobstructive CAD  . Dementia    Past Surgical History  Procedure Laterality Date  . Prostatectomy    . Left heart catheterization with coronary angiogram N/A 12/10/2014    Procedure: LEFT HEART CATHETERIZATION WITH CORONARY ANGIOGRAM;  Surgeon: [name], MD;  Location[facility] C[facility] LAB;  Service: Cardiovascular;  Laterality: N/A;  . Loop recorder implant N/A 12/16/2014    Procedure: LOOP RECORDER IMPLANT;  Surgeon: [name], MD;  Location:  [facility] CATH LAB;  Service: Cardiovascular;  Laterality: N/A;   Family History  Problem Relation Age of Onset  . Hypertension Father     Passed away from cerebral hemorrhage at age of 67.  [name] Dementia Mother     Passed away  . Hypertension Child     4 adult children  . Cervical cancer Other   . Lung cancer Other   . Stroke Other   . Heart attack Neg Hx    History  Substance Use Topics  . Smoking status: Never Smoker   . Smokeless tobacco: Never Used  . Alcohol Use: No    Review of Systems  All other systems reviewed and are negative.     Allergies  Aricept and Other  Home Medications   Prior to Admission medications   Medication Sig Start Date End Date Taking? Authorizing Provider  acetaminophen (TYLENOL) 500 MG tablet Take 500 mg by mouth every 4 (four) hours as needed for mild pain or headache.    Historical Provider, MD  amLODipine (NORVASC) 2.5 MG tablet Take 1 tablet (2.5 mg total) by mouth daily. 12/17/14   Isaiah Serge, NP  aspirin EC 81 MG EC tablet Take 1 tablet (81 mg total) by mouth daily. 12/17/14   Isaiah Serge, NP  atorvastatin (LIPITOR) 40 MG tablet TAKE 1 TABLET (40 MG TOTAL) BY MOUTH EVERY EVENING. 11/26/14   Francesca Oman, DO  b complex vitamins tablet Take 1 tablet by mouth daily.    Historical Provider, MD  Calcium Carbonate-Vitamin D (CALCIUM-VITAMIN D) 500-200 MG-UNIT per tablet Take 1 tablet by mouth daily.    Historical Provider, MD  carbidopa-levodopa (SINEMET IR) 25-100 MG per tablet Take 1 tablet by mouth 2 (two) times daily. 12/17/14   Isaiah Serge, NP  fish oil-omega-3 fatty acids 1000 MG capsule Take 2 g by mouth daily.     Historical Provider, MD  furosemide (LASIX) 20 MG tablet Take 1 tablet (20 mg total) by mouth daily. 04/14/15   Liliane Shi, PA-C  Multiple Vitamins-Minerals (MULTIVITAMIN WITH MINERALS) tablet Take 1 tablet by mouth daily.    Historical Provider, MD  NAMENDA XR 28 MG CP24 24 hr capsule TAKE 1 CAPSULE BY MOUTH DAILY 03/11/15    Garvin Fila, MD  omeprazole (PRILOSEC) 20 MG capsule TAKE 1 CAPSULE (20 MG TOTAL) BY MOUTH DAILY. 01/29/15   Francesca Oman, DO  potassium chloride (K-DUR) 10 MEQ tablet Take 1 tablet (10 mEq total) by mouth as directed. 1st dose take 2 tabs = 20 meq then decrease to 1 tab = 10 meq daily 04/14/15   Liliane Shi, PA-C  warfarin (COUMADIN) 5 MG tablet Take 1&1/2 tablets on Mondays; Take 1 tablet all other days. Patient taking differently: Take 5-7.5 mg by mouth daily. Take 1&1/2 tablets on Mondays; Take 1 tablet all other days. 01/20/15   Nischal Narendra, MD   BP 132/89 mmHg  Pulse 63  Temp(Src) 98.4 F (36.9 C) (Oral)  Resp 20  SpO2 98% Physical Exam  Constitutional: He is oriented to person, place, and time. He appears well-developed and well-nourished. No distress.  HENT:  Head: Normocephalic.  There is a 1 cm laceration to the left lateral eyebrow. It is well approximated and bleeding is controlled.  Eyes: EOM are normal. Pupils are equal, round, and reactive to light.  Neck: Normal range of motion. Neck supple.  Cardiovascular: Normal rate, regular rhythm and normal heart sounds.   No murmur heard. Pulmonary/Chest: Effort normal and breath sounds normal. No respiratory distress. He has no wheezes. He has no rales.  Abdominal: Soft. Bowel sounds are normal. He exhibits no distension. There is no tenderness.  Musculoskeletal: Normal range of motion. He exhibits no edema.  Neurological: He is alert and oriented to person, place, and time. No cranial nerve deficit. Coordination normal.  Skin: Skin is warm and dry. He is not diaphoretic.  Nursing note and vitals reviewed.   ED Course  Procedures (including critical care time) Labs Review Labs Reviewed - No data to display  Imaging Review No results found.   EKG Interpretation None      MDM   Final diagnoses:  None    Patient brought here by daughter after a fall at home. He has a laceration to the left eyebrow that is  proximally 1 cm in length and does not requires suturing. His neurologic exam is nonfocal and CT scan of the head is negative. He will be discharged home with when necessary return.    Veryl Speak, MD 04/28/15 [phone]

## 2015-04-29 ENCOUNTER — Encounter: Payer: Self-pay | Admitting: *Deleted

## 2015-05-01 ENCOUNTER — Encounter: Payer: Self-pay | Admitting: Internal Medicine

## 2015-05-01 ENCOUNTER — Other Ambulatory Visit: Payer: Self-pay | Admitting: Internal Medicine

## 2015-05-01 NOTE — Addendum Note (Signed)
Addended by: Marcelino Duster on: 05/01/2015 02:42 PM   Modules accepted: Orders

## 2015-05-02 LAB — CUP PACEART REMOTE DEVICE CHECK: Date Time Interrogation Session: 20160812142221

## 2015-05-03 ENCOUNTER — Other Ambulatory Visit: Payer: Self-pay | Admitting: Internal Medicine

## 2015-05-05 ENCOUNTER — Ambulatory Visit (INDEPENDENT_AMBULATORY_CARE_PROVIDER_SITE_OTHER): Payer: Commercial Managed Care - HMO | Admitting: Pharmacist

## 2015-05-05 DIAGNOSIS — Z7902 Long term (current) use of antithrombotics/antiplatelets: Secondary | ICD-10-CM | POA: Diagnosis not present

## 2015-05-05 DIAGNOSIS — Z7901 Long term (current) use of anticoagulants: Secondary | ICD-10-CM

## 2015-05-05 DIAGNOSIS — I2699 Other pulmonary embolism without acute cor pulmonale: Secondary | ICD-10-CM | POA: Diagnosis not present

## 2015-05-05 LAB — POCT INR: INR: 3

## 2015-05-05 NOTE — Patient Instructions (Signed)
Patient instructed to take medications as defined in the Anti-coagulation Track section of this encounter.  Patient instructed to take today's dose.  Patient verbalized understanding of these instructions.    

## 2015-05-05 NOTE — Progress Notes (Signed)
Anti-Coagulation Progress Note  [name] [name] is a 81 y.o. male who is currently on an anti-coagulation regimen.    RECENT RESULTS: Recent results are below, the most recent result is correlated with a dose of 40 mg. per week: Lab Results  Component Value Date   INR 3.0 05/05/2015   INR 2.00 04/07/2015   INR 2.40 03/10/2015    ANTI-COAG DOSE: Anticoagulation Dose Instructions as of 05/05/2015      [name] Tue Wed Thu Fri Sat   New Dose 5 mg 5 mg 5 mg 5 mg 7.5 mg 5 mg 5 mg       ANTICOAG SUMMARY: Anticoagulation Episode Summary    Current INR goal 2.0-3.0  Next INR check 06/02/2015  INR from last check 3.0 (05/05/2015)  Weekly max dose   Target end date Indefinite  [facility] check [facility]  Preferred lab   Send INR reminders to ANTICOAG IMP   Indications  Pulmonary embolism [I26.99] Encounter for long-term use of antiplatelets/antithrombotics [Z79.02]        Comments History of multiple venous embolic episodes. Will continue to annual re-evaluate continued need for warfarin weighing risks vs. benefits.       Anticoagulation Care Providers    Provider Role Specialty Phone number   Bertha Stakes, MD  Internal Medicine [phone]      ANTICOAG TODAY: Anticoagulation Summary as of 05/05/2015    INR goal 2.0-3.0  Selected INR 3.0 (05/05/2015)  Next INR check 06/02/2015  Target end date Indefinite   Indications  Pulmonary embolism [I26.99] Encounter for long-term use of antiplatelets/antithrombotics [Z79.02]      Anticoagulation Episode Summary    INR check [facility]   Preferred lab    Send INR reminders to ANTICOAG IMP   Comments History of multiple venous embolic episodes. Will continue to annual re-evaluate continued need for warfarin weighing risks vs. benefits.     Anticoagulation Care Providers    Provider Role Specialty Phone number   Bertha Stakes, MD  Internal Medicine [phone]      PATIENT INSTRUCTIONS: Patient  Instructions  Patient instructed to take medications as defined in the Anti-coagulation Track section of this encounter.  Patient instructed to take today's dose.  Patient verbalized understanding of these instructions.       FOLLOW-UP Return in about 4 weeks (around 06/02/2015) for Follow up INR at 1130h.  Jorene Guest, [name] Pharm.D., CACP

## 2015-05-06 ENCOUNTER — Ambulatory Visit: Payer: Commercial Managed Care - HMO | Admitting: *Deleted

## 2015-05-07 ENCOUNTER — Ambulatory Visit (INDEPENDENT_AMBULATORY_CARE_PROVIDER_SITE_OTHER): Payer: Commercial Managed Care - HMO | Admitting: Neurology

## 2015-05-07 ENCOUNTER — Encounter: Payer: Self-pay | Admitting: Neurology

## 2015-05-07 ENCOUNTER — Other Ambulatory Visit (INDEPENDENT_AMBULATORY_CARE_PROVIDER_SITE_OTHER): Payer: Commercial Managed Care - HMO

## 2015-05-07 ENCOUNTER — Other Ambulatory Visit: Payer: Self-pay | Admitting: *Deleted

## 2015-05-07 ENCOUNTER — Other Ambulatory Visit: Payer: Self-pay | Admitting: Internal Medicine

## 2015-05-07 VITALS — BP 137/88 | HR 70 | Resp 14 | Ht 68.0 in | Wt 154.0 lb

## 2015-05-07 DIAGNOSIS — D649 Anemia, unspecified: Secondary | ICD-10-CM

## 2015-05-07 DIAGNOSIS — Z9181 History of falling: Secondary | ICD-10-CM

## 2015-05-07 DIAGNOSIS — R269 Unspecified abnormalities of gait and mobility: Secondary | ICD-10-CM | POA: Diagnosis not present

## 2015-05-07 DIAGNOSIS — G214 Vascular parkinsonism: Secondary | ICD-10-CM | POA: Diagnosis not present

## 2015-05-07 DIAGNOSIS — F0390 Unspecified dementia without behavioral disturbance: Secondary | ICD-10-CM

## 2015-05-07 DIAGNOSIS — R296 Repeated falls: Secondary | ICD-10-CM | POA: Diagnosis not present

## 2015-05-07 LAB — POC HEMOCCULT BLD/STL (HOME/3-CARD/SCREEN)
Card #2 Fecal Occult Blod, POC: NEGATIVE
Card #3 Fecal Occult Blood, POC: NEGATIVE
FECAL OCCULT BLD: NEGATIVE

## 2015-05-07 NOTE — Progress Notes (Signed)
Subjective:    Patient ID: [name] [name] is a 81 y.o. male.  HPI     Interim history:   Dear Mamie Nick,   I saw your patient, [name] [name], upon your kind request for second opinion of his parkinsonism. The patient is accompanied by [name] [name], oldest daughter today. As you know, [name] [name] is an 81 year old right-handed gentleman with a complex medical history of hypertension, pituitary microadenoma, prostate cancer, hyperlipidemia, TIA in 2011, coronary artery disease, history of MI, with history of multivessel nonobstructive coronary artery disease, advanced dementia, recurrent pulmonary embolism with lifelong Coumadin therapy, status post prostatectomy, and status post loop recorder implant, who has had parkinsonism. I reviewed your office note from 04/23/2015. His MMSE was 17 out of 30 at the time. He could not tolerate Aricept in the past. He's currently on Sinemet, 25-100 milligrams strength immediate release, 1 pill twice daily. He is on Namenda long-acting 28 mg daily. In addition, he is on Norvasc 2.5 mg daily, baby aspirin, Coumadin, potassium, Prilosec, Lasix, and Lipitor.  He had a CTH wo contrast on 04/28/15: Small LEFT supraorbital/ frontal scalp hematoma.  No skull fracture. No acute intracranial process. Stable appearance of the head from Feb 13, 2015 including moderate to severe chronic small vessel ischemic disease and, pituitary Macroadenoma. He moved in with his oldest daughter, [name] [name], in April 2014. This is when she started noticing his memory loss. Memory loss has been progressive. Last year he was noted to have parkinsonism particularly with respect to gait dysfunction. He was placed on Sinemet which was titrated up to 3 times a day but he was having a hard time maintaining the schedule with 3 pills a day. He's currently taking it in the morning and in the evening. He has been able to tolerate it. He has not had necessarily a telltale response but perhaps had improvement in his  walking. He has a walker available but rarely uses it. Especially at night he has a tendency to walk without it. He is particularly unstable with his balance at night. He has had falls. His most recent fall was in early August and he hit his head. This is when he had his head CT. [name] [name] also takes care of her mother who has advanced dementia. She has help at home until 10 PM but is getting ready to go back to work and will need additional help at home. She is trying to get assistance through the [place] for her father. A few months ago he had significant issues with low blood pressure. His Lasix was reduced. He had a fainting spell after which his heart monitor was implanted.  His appetite is good. He does not drink enough water. He may average 3 glasses a day.  His Past Medical History Is Significant For: Past Medical History  Diagnosis Date  . PE (pulmonary embolism)     unprovoked PE completed 6 months of warfarin, warfarin d/ced 02/12/2010, repeat PE 07/10/2010 after a long car ride and now on lifelong coumadin.  [name] Hypertension   . Pituitary microadenoma     incidental finding CT 12/2009  . Prostate cancer   . Depression   . Hyperlipidemia   . Coronary artery disease, non-occlusive     with history of MI; Cath 2008 w multivessel nonobstructive CAD  . Dementia   . GERD (gastroesophageal reflux disease)   . Scoliosis   . Anxiety     His Past Surgical History Is Significant For: Past Surgical History  Procedure Laterality Date  .  Prostatectomy    . Left heart catheterization with coronary angiogram N/A 12/10/2014    Procedure: LEFT HEART CATHETERIZATION WITH CORONARY ANGIOGRAM;  Surgeon: [name], MD;  Location: [facility] CATH LAB;  Service: Cardiovascular;  Laterality: N/A;  . Loop recorder implant N/A 12/16/2014    Procedure: LOOP RECORDER IMPLANT;  Surgeon: [name], MD;  Location: [facility] CATH LAB;  Service: Cardiovascular;  Laterality: N/A;    His Family History Is Significant  For: Family History  Problem Relation Age of Onset  . Hypertension Father     Passed away from cerebral hemorrhage at age of 77.  [name] Dementia Mother     Passed away  . Hypertension Child     4 adult children  . Cervical cancer Other   . Lung cancer Other   . Stroke Other   . Heart attack Neg Hx     His Social History Is Significant For: Social History   Social History  . Marital Status: Married    Spouse Name: N/A  . Number of Children: 4  . Years of Education: master's   Occupational History  . Retired     retired   Social History Main Topics  . Smoking status: Never Smoker   . Smokeless tobacco: Never Used  . Alcohol Use: No  . Drug Use: No  . Sexual Activity: Yes   Other Topics Concern  . None   Social History Narrative   Lives with daughter. Wife also has severe dementia.    Drinks 1 cup of coffee a day     His Allergies Are:  Allergies  Allergen Reactions  . Aricept [Donepezil Hcl] Other (See Comments)    Symptomatic Bradycardia.  . Other Other (See Comments)    Shrimp gives him gout  :   His Current Medications Are:  Outpatient Encounter Prescriptions as of 05/07/2015  Medication Sig  . acetaminophen (TYLENOL) 500 MG tablet Take 500 mg by mouth every 4 (four) hours as needed for mild pain or headache.  [name] amLODipine (NORVASC) 2.5 MG tablet Take 1 tablet (2.5 mg total) by mouth daily.  [name] aspirin EC 81 MG EC tablet Take 1 tablet (81 mg total) by mouth daily.  [name] atorvastatin (LIPITOR) 40 MG tablet TAKE 1 TABLET (40 MG TOTAL) BY MOUTH EVERY EVENING.  [name] b complex vitamins tablet Take 1 tablet by mouth daily.  . Calcium Carbonate-Vitamin D (CALCIUM-VITAMIN D) 500-200 MG-UNIT per tablet Take 1 tablet by mouth daily.  . carbidopa-levodopa (SINEMET IR) 25-100 MG per tablet Take 1 tablet by mouth 2 (two) times daily.  . fish oil-omega-3 fatty acids 1000 MG capsule Take 2 g by mouth daily.   . furosemide (LASIX) 20 MG tablet Take 1 tablet (20 mg total) by mouth  daily.  . Multiple Vitamins-Minerals (MULTIVITAMIN WITH MINERALS) tablet Take 1 tablet by mouth daily.  [name] NAMENDA XR 28 MG CP24 24 hr capsule TAKE 1 CAPSULE BY MOUTH DAILY  . omeprazole (PRILOSEC) 20 MG capsule TAKE 1 CAPSULE (20 MG TOTAL) BY MOUTH DAILY.  [name] potassium chloride (K-DUR) 10 MEQ tablet Take 1 tablet (10 mEq total) by mouth as directed. 1st dose take 2 tabs = 20 meq then decrease to 1 tab = 10 meq daily (Patient taking differently: Take 10 mEq by mouth daily. 1st dose take 2 tabs = 20 meq then decrease to 1 tab = 10 meq daily)  . vitamin C (ASCORBIC ACID) 500 MG tablet Take 500 mg by mouth daily.  [name]  warfarin (COUMADIN) 5 MG tablet Take 1&1/2 tablets on Mondays; Take 1 tablet all other days. (Patient taking differently: Take 5-7.5 mg by mouth daily. Take 1&1/2 tablets on Mondays and thursday; Take 1 tablet all other days.)   No facility-administered encounter medications on file as of 05/07/2015.  :  Review of Systems:  Out of a complete 14 point review of systems, all are reviewed and negative with the exception of these symptoms as listed below:  Review of Systems  Constitutional: Positive for fatigue.  Neurological: Positive for dizziness, syncope and weakness.       Gait problem, recent falls, memory loss, freezing of legs- takes Sinemet BID, soft voice.   Psychiatric/Behavioral: Positive for confusion.       Anxiety    Objective:  Neurologic Exam  Physical Exam Physical Examination:   Filed Vitals:   05/07/15 1326  BP: 137/88  Pulse: 70  Resp:    He did not have orthostatic dizziness or blood pressure drop today. His pulse increased to 70 from 65 when standing.  General Examination: The patient is a very pleasant 81 y.o. male in no acute distress.  HEENT: Normocephalic, atraumatic, pupils are equal, round and reactive to light and accommodation. Funduscopic exam is normal with sharp disc margins noted. Extraocular tracking shows  Moderate saccadic breakdown without  nystagmus noted. He has limitation to upgaze. Hearing seems intact. Face is symmetric with mild facial masking noted. Neck is mild to moderately rigid. He has no lip, neck or jaw tremor. He has no drooling. He has no facial dyskinesias.  Chest: is clear to auscultation without wheezing, rhonchi or crackles noted.  Heart: sounds are regular and normal without murmurs, rubs or gallops noted.   Abdomen: is soft, non-tender and non-distended with normal bowel sounds appreciated on auscultation.  Extremities: There is no pitting edema in the distal lower extremities bilaterally.   Skin: is warm and dry with no trophic changes noted. Age-related changes are noted on the skin.   Musculoskeletal: exam reveals no obvious joint deformities, tenderness, joint swelling or erythema.  Neurologically:  Mental status: The patient is awake and alert, paying fair  attention. He  Is unable to provide his history. His daughter provides essentially the entire history. He is able to answer some simple questions with very short sentences. His memory, knowledge, orientation are impaired.  He seems to have a moderate degree of bradyphrenia. Speech is moderately hypophonic with no dysarthria noted.  Cranial nerves are as described above under HEENT exam. In addition, shoulder shrug is normal with equal shoulder height noted.  Motor exam:  He has normal bulk and global strength of 4+ out of 5. He has mild rigidity in his upper extremities and mild to moderate rigidity in his lower extremities. Fine motor skills are mildly impaired in both upper extremities and moderately impaired in both lower extremities. He stands up with difficulty and has to push himself up and needs mild assistance. His posture is moderately stooped. He walks with decreased stride length and pace and turns in multiple steps. His balance is impaired. He did not bring his walker today.  Reflexes are 1+ throughout. Sensory exam is intact to light touch  in the upper and lower extremities.   Assessment and Plan:   In summary, [name] [name] is a very pleasant 81 y.o.-year old male with a complex medical history of hypertension, pituitary microadenoma, prostate cancer, hyperlipidemia, TIA in 2011, coronary artery disease, history of MI, with history of multivessel nonobstructive  coronary artery disease, advanced dementia, recurrent pulmonary embolism with lifelong Coumadin therapy, status post prostatectomy, and status post loop recorder implant, who has had parkinsonism.  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  He has multiple vascular risk factors and significant white matter changes on his latest  Head CT.  He has had modest benefit with low-dose Sinemet. I do not suggest that we increase this at this time. In the past he had trouble remembering a third dose. At this juncture, I would not recommend any medication to help increase his blood pressure. In fact, if he does have lower blood pressure values , his blood pressure medication should be adjusted first. He does not drink enough water. His major issue at this time is his gait disorder. He is at significant fall risk. He is reminded to use his walker at all times. His daughter will certainly benefit from additional help especially overnight with both her parents. She is commended for providing such good care. She is advised that good nutrition, physical activity and better hydration arche. I suggested a follow-up with you in 3-4 months. I discussed my recommendations with patient's daughter at length today.  I answered all her questions today and the patient and [name] [name] were in agreement.   Thank you very much for allowing me to participate in the care of this nice patient.   Sincerely,   Star Age, MD, PhD

## 2015-05-07 NOTE — Patient Outreach (Signed)
[facility]) Care Management   05/07/2015  Banjamin [name] 01/20/1931 [id number]  [name] [name] is an 81 y.o. male  Subjective:   Objective:   Review of Systems  Constitutional: Negative.   HENT: Negative.   Eyes: Negative.   Respiratory: Negative.   Cardiovascular: Negative.   Gastrointestinal: Negative.   Genitourinary: Negative.   Musculoskeletal: Positive for joint pain and falls.  Skin: Negative.   Neurological: Negative.   Endo/Heme/Allergies: Negative.   Psychiatric/Behavioral: Negative.     Physical Exam  Constitutional: He appears well-developed and well-nourished.  Neck: Normal range of motion.  Cardiovascular: Normal rate, regular rhythm and normal heart sounds.   Respiratory: Effort normal and breath sounds normal.  GI: Soft. Bowel sounds are normal.  Musculoskeletal: Normal range of motion.  Neurological: He is alert.  Skin: Skin is warm and dry.   BP 138/90 mmHg  Pulse 69  Resp 18  Wt 156 lb (70.761 kg)  SpO2 97%  Current Medications:   Current Outpatient Prescriptions  Medication Sig Dispense Refill  . acetaminophen (TYLENOL) 500 MG tablet Take 500 mg by mouth every 4 (four) hours as needed for mild pain or headache.    [name] amLODipine (NORVASC) 2.5 MG tablet Take 1 tablet (2.5 mg total) by mouth daily. 30 tablet 6  . atorvastatin (LIPITOR) 40 MG tablet TAKE 1 TABLET (40 MG TOTAL) BY MOUTH EVERY EVENING. 90 tablet 1  . b complex vitamins tablet Take 1 tablet by mouth daily.    . Calcium Carbonate-Vitamin D (CALCIUM-VITAMIN D) 500-200 MG-UNIT per tablet Take 1 tablet by mouth daily.    . carbidopa-levodopa (SINEMET IR) 25-100 MG per tablet Take 1 tablet by mouth 2 (two) times daily. 60 tablet 3  . fish oil-omega-3 fatty acids 1000 MG capsule Take 2 g by mouth daily.     . furosemide (LASIX) 20 MG tablet Take 1 tablet (20 mg total) by mouth daily.    . Multiple Vitamins-Minerals (MULTIVITAMIN WITH MINERALS) tablet Take 1 tablet by mouth  daily.    [name] NAMENDA XR 28 MG CP24 24 hr capsule TAKE 1 CAPSULE BY MOUTH DAILY 30 capsule 3  . omeprazole (PRILOSEC) 20 MG capsule TAKE 1 CAPSULE (20 MG TOTAL) BY MOUTH DAILY. 90 capsule 0  . potassium chloride (K-DUR) 10 MEQ tablet Take 1 tablet (10 mEq total) by mouth as directed. 1st dose take 2 tabs = 20 meq then decrease to 1 tab = 10 meq daily (Patient taking differently: Take 10 mEq by mouth daily. 1st dose take 2 tabs = 20 meq then decrease to 1 tab = 10 meq daily) 30 tablet 11  . warfarin (COUMADIN) 5 MG tablet Take 1&1/2 tablets on Mondays; Take 1 tablet all other days. (Patient taking differently: Take 5-7.5 mg by mouth daily. Take 1&1/2 tablets on Mondays and thursday; Take 1 tablet all other days.) 40 tablet 2  . aspirin EC 81 MG EC tablet Take 1 tablet (81 mg total) by mouth daily. (Patient not taking: Reported on 05/05/2015)    . vitamin C (ASCORBIC ACID) 500 MG tablet Take 500 mg by mouth daily.     No current facility-administered medications for this visit.    Functional Status:   In your present state of health, do you have any difficulty performing the following activities: 04/15/2015 02/03/2015  Hearing? N N  Vision? N N  Difficulty concentrating or making decisions? N Y  Walking or climbing stairs? Y N  Dressing or bathing? N N  Doing errands, shopping? Y Y  Preparing Food and eating ? - -  Using the Toilet? - -  In the past six months, have you accidently leaked urine? - -  Do you have problems with loss of bowel control? - -  Managing your Medications? - -  Managing your Finances? - -  Housekeeping or managing your Housekeeping? - -    Fall/Depression Screening:    PHQ 2/9 Scores 04/15/2015 03/10/2015 02/03/2015 01/20/2015 01/13/2015 12/23/2014 10/09/2014  PHQ - 2 Score 0 1 0 0 4 0 0  PHQ- 9 Score - - - - 14 - -    Assessment:    Met with member and daughter at scheduled time.  Member complains of pain in his great toe.  Daughter states that she has requested a referral  from the primary care physician for the member to see a podiatrist, but has not received a call for an appointment.  Daughter states that the pain is related to needed foot care (particularly nail clipping and filing).  Daughter also states that the pain in the member's foot has decreased his ability to keep his balance and walk steady, and ability to increase his activity.  She also reports that she has requested a referral for physical therapy, but according to the daughter, gait therapy was recommended instead.  However, she still has not received any calls regarding assessments for this. Daughter places call to PCP office to inquire about referrals, message left, requesting nurse or PCP to provide call back.  Daughter advised to notify this care manager if she does not receive a call back by this Friday.  Along with member's toe pain, member continues to complain of some back/hip discomfort.  Daughter states that she has been working with bone specialists regarding treatment and spinal cord injections has been recommended rather than surgery.  She reports that she has been skeptical and has delayed scheduling appointment, but has today decided to proceed.  She placed call to the ortho physician to schedule the spinal injections.  Daughter states that her concern is the member being off the coumadin and having to receive Lovenox injections instead.  She reports that she and her brother will be able to provide injections if needed.  Daughter reports that the member's blood pressure has stabilized and she has no concerns about that at this time and that her only concerns are to increase the member's mobility and decrease his pain level, both of which has been addressed today.  Daughter states that she will speak with neuro physician today to inquire about other possibilities of treatment for member's gait, and will have them look further into a referral for physical and/or gait therapy.  Daughter encouraged to  contact this care manager with any other questions or concerns.   Plan:   Routine home visit scheduled for next month.  [facility] CM Care Plan Problem One        Patient Outreach from 05/07/2015 in [place] Problem One  Frequent falls   Care Plan for Problem One  Active   [facility] Long Term Goal (31-90 days)  Member will be free from falls within the next 31 days   [facility] Long Term Goal Start Date  04/08/15   Interventions for Problem One Long Term Goal  Discussed falls prevention, EMMI education provided   [facility] CM Short Term Goal #1 (0-30 days)  Daughter will inquire about home health physical therapy within the next 3 weeks  [facility] CM Short Term Goal #1 Start Date  04/08/15   Interventions for Short Term Goal #1  Encouraged to call physician to set up appointment and inquire over the phone about physical therapy prior to appointment   [facility] CM Short Term Goal #2 (0-30 days)  Member will have an increase in physical activity within the next 4 weeks   [facility] CM Short Term Goal #2 Start Date  04/08/15   Interventions for Short Term Goal #2  Discussed home activities and community activities for seniors    [facility] [place] Problem Two        Patient Outreach from 05/07/2015 in [place] Problem Two  Pain control   Care Plan for Problem Two  Active   Interventions for Problem Two Long Term Goal   Referral requested from PCP for podiatrist, Spinal injections scheduled   [facility] Long Term Goal (31-90) days  Member will report a decrease in pain level (back and toe) within the next 60 days   [facility] Long Term Goal Start Date  05/07/15    [facility] CM Care Plan Problem Three        Patient Outreach from 04/08/2015 in [place] Problem Three  Back Pain   Care Plan for Problem Three  Not Active     [name], BSN, [place] Manager [phone]

## 2015-05-07 NOTE — Patient Instructions (Signed)
Your biggest issue is fall risk. You need to use your walker at all times! I would recommend continuation of sinemet at the current dose. I would not recommend any medication to elevate your blood pressure at this time.   Follow up with Dr. Leonie Man in about 4 months.

## 2015-05-07 NOTE — Telephone Encounter (Signed)
Called pharm, they have now taken it off his medlist there

## 2015-05-08 ENCOUNTER — Telehealth: Payer: Self-pay | Admitting: Internal Medicine

## 2015-05-08 NOTE — Telephone Encounter (Signed)
Pt's daughter has set the appt for spinal procedure(injections) with [facility] for 9/12 at 1230. Please call her at [phone], andrea byers. Will page dr groce with this info also

## 2015-05-08 NOTE — Telephone Encounter (Signed)
Called pt's daughter back, got vmail, lm for rtc

## 2015-05-08 NOTE — Telephone Encounter (Signed)
Pt daughter request the nurse to call back regarding pt.

## 2015-05-09 ENCOUNTER — Encounter: Payer: Self-pay | Admitting: Internal Medicine

## 2015-05-09 NOTE — Telephone Encounter (Signed)
Called and left message. Will try again on Monday. Thanks

## 2015-05-13 NOTE — Telephone Encounter (Signed)
Spoke w/ dr Maudie Mercury today, she will be working w/ dr groce to provide care

## 2015-05-15 ENCOUNTER — Ambulatory Visit (INDEPENDENT_AMBULATORY_CARE_PROVIDER_SITE_OTHER): Payer: Commercial Managed Care - HMO | Admitting: *Deleted

## 2015-05-15 DIAGNOSIS — R55 Syncope and collapse: Secondary | ICD-10-CM | POA: Diagnosis not present

## 2015-05-19 ENCOUNTER — Ambulatory Visit (INDEPENDENT_AMBULATORY_CARE_PROVIDER_SITE_OTHER): Payer: Commercial Managed Care - HMO | Admitting: Podiatry

## 2015-05-19 ENCOUNTER — Encounter: Payer: Self-pay | Admitting: Podiatry

## 2015-05-19 VITALS — BP 140/83 | HR 74 | Resp 16 | Ht 68.0 in | Wt 156.0 lb

## 2015-05-19 DIAGNOSIS — B351 Tinea unguium: Secondary | ICD-10-CM | POA: Diagnosis not present

## 2015-05-19 DIAGNOSIS — L600 Ingrowing nail: Secondary | ICD-10-CM

## 2015-05-19 DIAGNOSIS — M79606 Pain in leg, unspecified: Secondary | ICD-10-CM

## 2015-05-19 NOTE — Progress Notes (Signed)
   Subjective:    Patient ID: [name] [name], male    DOB: Jan 21, 1931, 81 y.o.   MRN: [id number]  HPI Patient presents with bilateral nail fungus; great toes, 2nd toes; on Left foot; 3rd toe as well. Pt has tried OTC medications with no relief.  Pt also presents with debridement.   Review of Systems  Cardiovascular: Positive for leg swelling.  Musculoskeletal: Positive for back pain and gait problem.  Neurological: Positive for weakness.  All other systems reviewed and are negative.      Objective:   Physical Exam        Assessment & Plan:

## 2015-05-20 NOTE — Progress Notes (Signed)
Subjective:     Patient ID: [name] [name], male   DOB: 1931-08-01, 81 y.o.   MRN: [id number]  HPI patient presents with caregiver with nail fungus 1-5 of both feet that are thick and painful and they cannot cut. Patient also has ingrown component on the left and right big toenail   Review of Systems  All other systems reviewed and are negative.      Objective:   Physical Exam  Constitutional: He is oriented to person, place, and time.  Cardiovascular: Intact distal pulses.   Musculoskeletal: Normal range of motion.  Neurological: He is oriented to person, place, and time.  Skin: Skin is warm and dry.  Nursing note and vitals reviewed.  neurovascular status was found to be diminished but intact bilateral with patient having diminished muscle strength and range of motion subtalar midtarsal joint. 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 Today debridement of nailbeds 1-5 both feet with no iatrogenic bleeding was accomplished and patient will be seen back for routine care

## 2015-05-21 NOTE — Progress Notes (Signed)
Loop recorder 

## 2015-05-27 ENCOUNTER — Telehealth: Payer: Self-pay | Admitting: Internal Medicine

## 2015-05-27 ENCOUNTER — Telehealth: Payer: Self-pay | Admitting: Pharmacist

## 2015-05-27 NOTE — Telephone Encounter (Signed)
Daughter aware call was forward to Dr Maudie Mercury.

## 2015-05-27 NOTE — Telephone Encounter (Signed)
Contacted patient's daughter, [name], for perioperative anticoagulation management for [name]. [name] requests a face-to-face appointment with me. Will meet with her tomorrow 05/28/15 at 9:30 am to discuss anticoagulation management plan.

## 2015-05-27 NOTE — Telephone Encounter (Signed)
Patient daughter states patient has a procedure scheduled on 06/02/2015 and her Father needs to be on Lovenox and she would like someone to call her back as she states she is very concerned about her father's Procedure.

## 2015-05-27 NOTE — Telephone Encounter (Signed)
Daughter [name] [phone] asked about results hemoccult done 05/07/15 all 3 sample on card were negative. Hilda Blades Gaberial Cada RN 05/27/15 1:55PM

## 2015-05-28 ENCOUNTER — Encounter: Payer: Self-pay | Admitting: Pharmacist

## 2015-05-29 ENCOUNTER — Encounter: Payer: Self-pay | Admitting: Internal Medicine

## 2015-05-29 ENCOUNTER — Ambulatory Visit (INDEPENDENT_AMBULATORY_CARE_PROVIDER_SITE_OTHER): Payer: Commercial Managed Care - HMO | Admitting: Internal Medicine

## 2015-05-29 VITALS — BP 126/90 | HR 61 | Temp 98.3°F | Ht 68.0 in | Wt 157.7 lb

## 2015-05-29 DIAGNOSIS — G20 Parkinson's disease: Secondary | ICD-10-CM

## 2015-05-29 DIAGNOSIS — Z79899 Other long term (current) drug therapy: Secondary | ICD-10-CM

## 2015-05-29 DIAGNOSIS — K219 Gastro-esophageal reflux disease without esophagitis: Secondary | ICD-10-CM | POA: Diagnosis not present

## 2015-05-29 NOTE — Progress Notes (Signed)
Patient ID: [name] [name], male   DOB: 04/27/1931, 81 y.o.   MRN: [id number]   Subjective:   Patient ID: [name] [name] male   DOB: 04-Jul-1931 81 y.o.   MRN: [id number]  HPI: Null [name] [name] is a 81 y.o. with PMH as listed below who presents with daughter for concern about if there is a possible stomach ulcer/H. Pylori infection which may have interaction with patient's Carbidopa-Levodopa (Sinemet) medication for his parkinson's.  Please see problem list for details.  Past Medical History  Diagnosis Date  . PE (pulmonary embolism)     unprovoked PE completed 6 months of warfarin, warfarin d/ced 02/12/2010, repeat PE 07/10/2010 after a long car ride and now on lifelong coumadin.  [name] Hypertension   . Pituitary microadenoma     incidental finding CT 12/2009  . Prostate cancer   . Depression   . Hyperlipidemia   . Coronary artery disease, non-occlusive     with history of MI; Cath 2008 w multivessel nonobstructive CAD  . Dementia   . GERD (gastroesophageal reflux disease)   . Scoliosis   . Anxiety    Current Outpatient Prescriptions  Medication Sig Dispense Refill  . acetaminophen (TYLENOL) 500 MG tablet Take 500 mg by mouth every 4 (four) hours as needed for mild pain or headache.    [name] amLODipine (NORVASC) 2.5 MG tablet Take 1 tablet (2.5 mg total) by mouth daily. 30 tablet 6  . aspirin EC 81 MG EC tablet Take 1 tablet (81 mg total) by mouth daily.    [name] atorvastatin (LIPITOR) 40 MG tablet TAKE 1 TABLET (40 MG TOTAL) BY MOUTH EVERY EVENING. 90 tablet 1  . b complex vitamins tablet Take 1 tablet by mouth daily.    . Calcium Carbonate-Vitamin D (CALCIUM-VITAMIN D) 500-200 MG-UNIT per tablet Take 1 tablet by mouth daily.    . carbidopa-levodopa (SINEMET IR) 25-100 MG per tablet Take 1 tablet by mouth 2 (two) times daily. 60 tablet 3  . fish oil-omega-3 fatty acids 1000 MG capsule Take 2 g by mouth daily.     . furosemide (LASIX) 20 MG tablet Take 1 tablet (20 mg total) by mouth daily.      . Multiple Vitamins-Minerals (MULTIVITAMIN WITH MINERALS) tablet Take 1 tablet by mouth daily.    [name] NAMENDA XR 28 MG CP24 24 hr capsule TAKE 1 CAPSULE BY MOUTH DAILY 30 capsule 3  . omeprazole (PRILOSEC) 20 MG capsule TAKE 1 CAPSULE (20 MG TOTAL) BY MOUTH DAILY. 90 capsule 0  . potassium chloride (K-DUR) 10 MEQ tablet Take 1 tablet (10 mEq total) by mouth as directed. 1st dose take 2 tabs = 20 meq then decrease to 1 tab = 10 meq daily (Patient taking differently: Take 10 mEq by mouth daily. 1st dose take 2 tabs = 20 meq then decrease to 1 tab = 10 meq daily) 30 tablet 11  . vitamin C (ASCORBIC ACID) 500 MG tablet Take 500 mg by mouth daily.    [name] warfarin (COUMADIN) 5 MG tablet Take as directed. 40 tablet 2   No current facility-administered medications for this visit.   Family History  Problem Relation Age of Onset  . Hypertension Father     Passed away from cerebral hemorrhage at age of 38.  [name] Dementia Mother     Passed away  . Hypertension Child     4 adult children  . Cervical cancer Other   . Lung cancer Other   . Stroke Other   . Heart  attack Neg Hx    Social History   Social History  . Marital Status: Married    Spouse Name: N/A  . Number of Children: 4  . Years of Education: master's   Occupational History  . Retired     retired   Social History Main Topics  . Smoking status: Never Smoker   . Smokeless tobacco: Never Used  . Alcohol Use: No  . Drug Use: No  . Sexual Activity: Yes   Other Topics Concern  . None   Social History Narrative   Lives with daughter. Wife also has severe dementia.    Drinks 1 cup of coffee a day    Review of Systems: Review of Systems  Constitutional: Negative for fever and chills.  Respiratory: Negative for cough and hemoptysis.   Cardiovascular: Negative for chest pain, palpitations and leg swelling.  Gastrointestinal: Positive for constipation. Negative for heartburn, nausea, vomiting, diarrhea, blood in stool and melena.        Positive for stomach discomfort, low appetite.  Genitourinary: Negative for dysuria and hematuria.  Musculoskeletal: Positive for falls.  Neurological: Negative for headaches.    Objective:  Physical Exam: Filed Vitals:   05/29/15 1429  BP: 126/90  Pulse: 61  Temp: 98.3 F (36.8 C)  TempSrc: Oral  Height: 5\' 8"  (1.727 m)  Weight: 157 lb 11.2 oz (71.532 kg)  SpO2: 97%   Physical Exam  Constitutional: He appears well-developed and well-nourished.  Pleasant, elderly man. In wheelchair, NAD.  HENT:  Head: Normocephalic and atraumatic.  Cardiovascular: Normal rate and regular rhythm.  Exam reveals no gallop and no friction rub.   No murmur heard. Pulmonary/Chest: No respiratory distress. He has no wheezes. He has no rales. He exhibits no tenderness.  Decreased breath sounds, shallow breaths.  Abdominal: Soft. There is no tenderness. There is no guarding.  Musculoskeletal: He exhibits no edema or tenderness.  Neurological: He is alert.  Skin: Skin is warm.  Psychiatric: He has a normal mood and affect.    Assessment & Plan:  Please see problem-based charting for assessment and plan.

## 2015-05-29 NOTE — Patient Instructions (Addendum)
It was a pleasure to meet you [name].  For your stomach pains we will do a stool test to see if there is any active Helicobacter pylori infection which may cause your discomfort and interfere with your medication Sinemet.  The stool will need to be collected and delivered to the lab on the same day on a weekday Monday-Friday.

## 2015-05-29 NOTE — Assessment & Plan Note (Signed)
Patient with history of GERD currently taking Omeprazole 20 mg daily. Patient also on Carbidopa-Levodopa (Sinemet) 25-100 mg twice daily for Parkinsonism. He is accompanied by daughter, [name], who has concerns that his Sinemet has not been as effective recently and after speaking with pharmacy is wondering if an unknown underlying H. Pylori infection may be causing an interference with it's effectiveness. Patient does complain of his stomach bothering him, more often after meals, but says it is not painful. He does admit that he sometimes will not reveal to his family if he is having pain. He denies any cough, heartburn, nausea, vomiting, hematemesis, hemoptysis, hematuria, or hematochezia/melena. Patient's daughter is curious if there is a test for H. Pylori that can be done or if referral to GI would be needed.  Patient does not have alarm symptoms for H. Pylori infection or travel out of the country recently, but there is some thought that H. Pylor infection can decrease absorption of levodopa in the stomach. Patient and daughter advised that endoscopy is not a good option at this time and other options are serum antibody test, breath test, and stool antigen test.  -Check H. Pylori stool antigen -Patient's daughter willing and able to collect stool and advised to bring stool same day for testing on a weekday Monday-Friday. -If positive, will need to treat with triple (PPI, amoxicillin, clarithromycin) or quadruple therapy (PPI, bismuth salicylate, metronidazole, tetracycline/doxycycline).

## 2015-05-30 ENCOUNTER — Telehealth: Payer: Self-pay | Admitting: Pharmacist

## 2015-05-30 LAB — CUP PACEART REMOTE DEVICE CHECK: Date Time Interrogation Session: 20160909094315

## 2015-05-30 NOTE — Telephone Encounter (Signed)
Call to patient to confirm appointment for 06/02/15 at 11:30 lmtcb

## 2015-05-30 NOTE — Progress Notes (Signed)
Carelink summary report received. Battery status OK. Normal device function. No new symptom episodes, tachy episodes, brady, or pause episodes. No new AF episodes. Monthly summary reports and ROV with SK on 06/23/2015 at 10:30am.

## 2015-05-30 NOTE — Progress Notes (Signed)
Internal Medicine Clinic Attending  I saw and evaluated the patient.  I personally confirmed the key portions of the history and exam documented by Dr. Patel,Vishal and I reviewed pertinent patient test results.  The assessment, diagnosis, and plan were formulated together and I agree with the documentation in the resident's note.  

## 2015-06-02 ENCOUNTER — Ambulatory Visit
Admission: RE | Admit: 2015-06-02 | Discharge: 2015-06-02 | Disposition: A | Discharge status: Home / Self Care | Payer: Commercial Managed Care - HMO | Source: Ambulatory Visit | Attending: Internal Medicine | Admitting: Internal Medicine

## 2015-06-02 ENCOUNTER — Other Ambulatory Visit: Payer: Self-pay | Admitting: Internal Medicine

## 2015-06-02 ENCOUNTER — Ambulatory Visit (INDEPENDENT_AMBULATORY_CARE_PROVIDER_SITE_OTHER): Payer: Commercial Managed Care - HMO | Admitting: Pharmacist

## 2015-06-02 DIAGNOSIS — Z7901 Long term (current) use of anticoagulants: Secondary | ICD-10-CM

## 2015-06-02 DIAGNOSIS — M47817 Spondylosis without myelopathy or radiculopathy, lumbosacral region: Secondary | ICD-10-CM

## 2015-06-02 DIAGNOSIS — I2699 Other pulmonary embolism without acute cor pulmonale: Secondary | ICD-10-CM | POA: Diagnosis not present

## 2015-06-02 DIAGNOSIS — M545 Low back pain: Secondary | ICD-10-CM | POA: Diagnosis not present

## 2015-06-02 DIAGNOSIS — Z7902 Long term (current) use of antithrombotics/antiplatelets: Secondary | ICD-10-CM

## 2015-06-02 LAB — POCT INR: INR: 1.2

## 2015-06-02 MED ORDER — IOHEXOL 180 MG/ML  SOLN
1.0000 mL | Freq: Once | INTRAMUSCULAR | Status: DC | PRN
  Administered 2015-06-02: 1 mL via EPIDURAL

## 2015-06-02 MED ORDER — METHYLPREDNISOLONE ACETATE 40 MG/ML INJ SUSP (RADIOLOG
120.0000 mg | Freq: Once | INTRAMUSCULAR | Status: AC
  Administered 2015-06-02: 120 mg via EPIDURAL

## 2015-06-02 NOTE — Telephone Encounter (Signed)
Calling to verify some information about a medication for the patient.

## 2015-06-02 NOTE — Progress Notes (Addendum)
Patient ID: [name] [name], male   DOB: October 02, 1930, 81 y.o.   MRN: [id number]  Periperative Anticoagulation Management [name] [name] is a 81 y.o. male who was referred to clinical pharmacist for procedural management of warfarin (Coumadin). Met and reviewed plan with daughter, [name] [name], who assists at home with patient's medication management.  ASSESSMENT Planned procedure: epidurography on Monday,  06/02/15 around noon.  Anticoagulant Indication(s): history of recurrent PE  Bridging: indicated, Caprini Risk score=8 (high risk for post-op thromboembolism)  Labs:    Component Value Date/Time   AST 36* 04/15/2015 1629   ALT 21 04/15/2015 1629   NA 142 04/23/2015 1200   NA 140 11/12/2011   K 3.9 04/23/2015 1200   CL 105 04/23/2015 1200   CO2 30 04/23/2015 1200   GLUCOSE 96 04/23/2015 1200   HGBA1C 6.0 09/30/2014 1711   BUN 15 04/23/2015 1200   BUN 17 11/12/2011   CREATININE 1.11 04/23/2015 1200   CREATININE 0.96 12/23/2014 1214   CREATININE 1.1 11/12/2011   CALCIUM 9.5 04/23/2015 1200   GFRAA >60 02/13/2015 1830   GFRAA 85 12/23/2014 1214   WBC 3.1* 04/14/2015 1240   HGB 12.0* 04/14/2015 1240   HCT 37.3* 04/14/2015 1240   PLT 150.0 04/14/2015 1240   INR 3.0 05/05/2015 1126   INR 2.74* 02/13/2015 1830   PLAN Wed [facility] Fri Sat Sun Mon Tue Wed  05/28/15 05/29/15 05/30/15 05/31/15 06/01/15 06/02/15 06/03/15 06/04/15  Hold warfarin Hold warfarin Hold warfarin Start enoxaparin 70 mg every 12 hours Hold warfarin Enoxaparin 70 mg x 1 in the am only Hold warfarin Hold enoxaparin Hold warfarin Hold enoxaparin AM INR test Hold warfarin Hold enoxaparin In the evening: Re-start warfarin at previous weekly dosing Start enoxaparin 70 mg x 1 in the evening only   Medication Samples have been provided to the patient.  Drug: enoxaparin Strength: 80 mg Qty: 4 LOT: [id number] Exp.Date: 08/2017  The daughter has been instructed regarding the correct time, dose, and frequency of taking this medication,  including desired effects and most common side effects.   Jayah Balthazar J 10:06 AM 06/02/2015  Follow-up 06/02/15 at 11am in [facility] for INR testing to clear for procedure.  Kester Stimpson J

## 2015-06-02 NOTE — Progress Notes (Signed)
Anti-Coagulation Progress Note  [name] [name] is a 81 y.o. male who is currently on an anti-coagulation regimen.    RECENT RESULTS: Recent results are below, the most recent result is correlated with a dose of ZEROmg for past 5 days.  Lab Results  Component Value Date   INR 1.2 06/02/2015   INR 3.0 05/05/2015   INR 2.00 04/07/2015    ANTI-COAG DOSE: Anticoagulation Dose Instructions as of 06/02/2015      [name] Tue Wed Thu Fri Sat   New Dose Hold Hold Hold 5 mg 7.5 mg 5 mg 5 mg    Description        Patient has had warfarin on hold since 6-SEP-16 and on LMWH bridge therapy which was interrupted in advance of procedure as documented by Dr. Maudie Mercury. He wil recommence warfarin and LMWH bridge therapy 48h after procedure if cleared by his IR physician.        ANTICOAG SUMMARY: Anticoagulation Episode Summary    Current INR goal 2.0-3.0  Next INR check 06/09/2015  INR from last check 1.2! (06/02/2015)  Weekly max dose   Target end date Indefinite  [facility]  Preferred lab   Send INR reminders to ANTICOAG IMP   Indications  Pulmonary embolism [I26.99] Encounter for long-term use of antiplatelets/antithrombotics [Z79.02]        Comments History of multiple venous embolic episodes. Will continue to annual re-evaluate continued need for warfarin weighing risks vs. benefits.       Anticoagulation Care Providers    Provider Role Specialty Phone number   Bertha Stakes, MD  Internal Medicine [phone]      ANTICOAG TODAY: Anticoagulation Summary as of 06/02/2015    INR goal 2.0-3.0  Selected INR 1.2! (06/02/2015)  Next INR check 06/09/2015  Target end date Indefinite   Indications  Pulmonary embolism [I26.99] Encounter for long-term use of antiplatelets/antithrombotics [Z79.02]      Anticoagulation Episode Summary    [facility]   Preferred lab    Send INR reminders to ANTICOAG IMP   Comments History of multiple venous  embolic episodes. Will continue to annual re-evaluate continued need for warfarin weighing risks vs. benefits.     Anticoagulation Care Providers    Provider Role Specialty Phone number   Bertha Stakes, MD  Internal Medicine [phone]      PATIENT INSTRUCTIONS: Patient Instructions  Patient instructed to take medications as defined in the Anti-coagulation Track section of this encounter.  Patient instructed to OMIT today's dose. OMIT tomorrow's dose of warfarin and LMWH as previously instructed. Patient instructed to recommence warfarin and low molecular weight heparin enoxaparin/Lovenox 70mg  subcutaneously administered every 12 hours commencing Wednesday 14-SEP-16 with evening dose.  Patient verbalized understanding of these instructions.       FOLLOW-UP Return in 7 days (on 06/09/2015) for Follow up INR at 11:00AM.  Jorene Guest, [name] Pharm.D., CACP

## 2015-06-02 NOTE — Telephone Encounter (Signed)
[name] [name], pt's caretaker would like dr groce to call her at [phone], concerning restart of coumadin

## 2015-06-02 NOTE — Discharge Instructions (Addendum)
Post Procedure Spinal Discharge Instruction Sheet  1. You may resume a regular diet and any medications that you routinely take (including pain medications).  2. No driving day of procedure.  3. Light activity throughout the rest of the day.  Do not do any strenuous work, exercise, bending or lifting.  The day following the procedure, you can resume normal physical activity but you should refrain from exercising or physical therapy for at least three days thereafter.   Common Side Effects:   Headaches- take your usual medications as directed by your physician.  Increase your fluid intake.  Caffeinated beverages may be helpful.  Lie flat in bed until your headache resolves.   Restlessness or inability to sleep- you may have trouble sleeping for the next few days.  Ask your referring physician if you need any medication for sleep.   Facial flushing or redness- should subside within a few days.   Increased pain- a temporary increase in pain a day or two following your procedure is not unusual.  Take your pain medication as prescribed by your referring physician.   Leg cramps  Please contact our office at [phone] for the following symptoms:  Fever greater than 100 degrees.  Headaches unresolved with medication after 2-3 days.  Increased swelling, pain, or redness at injection site.  Thank you for visiting our office.   MAY RESUME COUMADIN TODAY AND LOVENOX AS INSTRUCTED BY YOUR DOCTOR.

## 2015-06-02 NOTE — Telephone Encounter (Signed)
Spoke w/ andrea, gave her dr groce's instructions to resume coumadin wed, she verb understanding

## 2015-06-02 NOTE — Telephone Encounter (Signed)
Dr Elie Confer called me back, pt is not to resume coumadin therapy until Wednesday, called andrea back and lm for rtc

## 2015-06-02 NOTE — Patient Instructions (Signed)
Patient instructed to take medications as defined in the Anti-coagulation Track section of this encounter.  Patient instructed to OMIT today's dose. OMIT tomorrow's dose of warfarin and LMWH as previously instructed. Patient instructed to recommence warfarin and low molecular weight heparin enoxaparin/Lovenox 70mg  subcutaneously administered every 12 hours commencing Wednesday 14-SEP-16 with evening dose.  Patient verbalized understanding of these instructions.

## 2015-06-09 ENCOUNTER — Ambulatory Visit (INDEPENDENT_AMBULATORY_CARE_PROVIDER_SITE_OTHER): Payer: Commercial Managed Care - HMO | Admitting: Pharmacist

## 2015-06-09 ENCOUNTER — Other Ambulatory Visit: Payer: Self-pay | Admitting: *Deleted

## 2015-06-09 DIAGNOSIS — Z7901 Long term (current) use of anticoagulants: Secondary | ICD-10-CM

## 2015-06-09 DIAGNOSIS — Z7902 Long term (current) use of antithrombotics/antiplatelets: Secondary | ICD-10-CM

## 2015-06-09 DIAGNOSIS — I2699 Other pulmonary embolism without acute cor pulmonale: Secondary | ICD-10-CM

## 2015-06-09 LAB — POCT INR: INR: 2.1

## 2015-06-09 NOTE — Patient Instructions (Signed)
Patient instructed to take medications as defined in the Anti-coagulation Track section of this encounter.  Patient instructed to take today's dose.  Patient verbalized understanding of these instructions.    

## 2015-06-09 NOTE — Progress Notes (Signed)
Anti-Coagulation Progress Note  [name] [name] is a 81 y.o. male who is currently on an anti-coagulation regimen.    RECENT RESULTS: Recent results are below, the most recent result is correlated with a dose of 37.5 mg. per week: Lab Results  Component Value Date   INR 2.10 06/09/2015   INR 1.2 06/02/2015   INR 3.0 05/05/2015    ANTI-COAG DOSE: Anticoagulation Dose Instructions as of 06/09/2015      [name] Tue Wed Thu Fri Sat   New Dose 5 mg 7.5 mg 5 mg 5 mg 7.5 mg 5 mg 5 mg       ANTICOAG SUMMARY: Anticoagulation Episode Summary    Current INR goal 2.0-3.0  Next INR check 06/30/2015  INR from last check 2.10 (06/09/2015)  Weekly max dose   Target end date Indefinite  [facility]  Preferred lab   Send INR reminders to ANTICOAG [facility]   Indications  Pulmonary embolism [I26.99] Encounter for long-term use of antiplatelets/antithrombotics [Z79.02]        Comments History of multiple venous embolic episodes. Will continue to annual re-evaluate continued need for warfarin weighing risks vs. benefits.       Anticoagulation Care Providers    Provider Role Specialty Phone number   Bertha Stakes, MD  Internal Medicine [phone]      ANTICOAG TODAY: Anticoagulation Summary as of 06/09/2015    INR goal 2.0-3.0  Selected INR 2.10 (06/09/2015)  Next INR check 06/30/2015  Target end date Indefinite   Indications  Pulmonary embolism [I26.99] Encounter for long-term use of antiplatelets/antithrombotics [Z79.02]      Anticoagulation Episode Summary    [facility]   Preferred lab    Send INR reminders to ANTICOAG IMP   Comments History of multiple venous embolic episodes. Will continue to annual re-evaluate continued need for warfarin weighing risks vs. benefits.     Anticoagulation Care Providers    Provider Role Specialty Phone number   Bertha Stakes, MD  Internal Medicine [phone]      PATIENT INSTRUCTIONS: Patient  Instructions  Patient instructed to take medications as defined in the Anti-coagulation Track section of this encounter.  Patient instructed to take today's dose.  Patient verbalized understanding of these instructions.       FOLLOW-UP Return in 3 weeks (on 06/30/2015) for Follow up INR at 1130h.  Jorene Guest, [name] Pharm.D., CACP

## 2015-06-09 NOTE — Patient Outreach (Addendum)
Call placed to member's daughter as follow up from last month's visit.  Daughter states that the member is doing "good."  She states that he had the injections in his back for the pain, and that it "seems to be helping."  She reports that he has also seen the podiatrist to evaluate his foot pain and for nail clippings, and says that the pain in his toe is better.  This care manager inquires about her prior concern regarding obtaining an order for physical therapy and/or gait training.  She states that the order was sent, but that she was now waiting for a call for evaluation.    She denies any concerns regarding his heart disease or his hypertension, stating that his blood pressures have been "really good lately."  She reports that she continues to monitor and document his blood pressures several times a week.  She denies any further needs from [facility] at this time.  She is encouraged to reach out to this care manager or the [facility] office if there are any future needs.  She has been provided with community resources that specialize in the assistance of patients with dementia/alzheimer's.  Will notify physician and care manager assistant of case closure.  [name], BSN, [place] [employer]  [facility] Care Manager [phone]

## 2015-06-10 ENCOUNTER — Ambulatory Visit: Payer: Commercial Managed Care - HMO | Admitting: *Deleted

## 2015-06-11 ENCOUNTER — Ambulatory Visit: Payer: Commercial Managed Care - HMO | Attending: Internal Medicine | Admitting: Physical Therapy

## 2015-06-11 ENCOUNTER — Encounter: Payer: Self-pay | Admitting: Physical Therapy

## 2015-06-11 DIAGNOSIS — R29818 Other symptoms and signs involving the nervous system: Secondary | ICD-10-CM | POA: Insufficient documentation

## 2015-06-11 DIAGNOSIS — W19XXXS Unspecified fall, sequela: Secondary | ICD-10-CM | POA: Insufficient documentation

## 2015-06-11 DIAGNOSIS — R269 Unspecified abnormalities of gait and mobility: Secondary | ICD-10-CM | POA: Diagnosis not present

## 2015-06-11 DIAGNOSIS — M623 Immobility syndrome (paraplegic): Secondary | ICD-10-CM | POA: Insufficient documentation

## 2015-06-11 DIAGNOSIS — R531 Weakness: Secondary | ICD-10-CM | POA: Diagnosis not present

## 2015-06-11 DIAGNOSIS — R2681 Unsteadiness on feet: Secondary | ICD-10-CM | POA: Diagnosis not present

## 2015-06-11 DIAGNOSIS — M2560 Stiffness of unspecified joint, not elsewhere classified: Secondary | ICD-10-CM

## 2015-06-11 DIAGNOSIS — Z7409 Other reduced mobility: Secondary | ICD-10-CM

## 2015-06-11 DIAGNOSIS — R2689 Other abnormalities of gait and mobility: Secondary | ICD-10-CM

## 2015-06-11 NOTE — Therapy (Signed)
[facility] [address] [place], [place], [address] Phone: [phone]   Fax:  [phone]  Physical Therapy Evaluation  Patient Details  Name: [name] [name] MRN: [id number] Date of Birth: [birth year] Referring Provider:  Sid Falcon, MD  Encounter Date: 06/11/2015      PT End of Session - 06/11/15 1100    Visit Number 1   Number of Visits 17   Date for PT Re-Evaluation 08/10/15   Authorization Type G-code every 10th visit   PT Start Time 1100   PT Stop Time 1145   PT Time Calculation (min) 45 min   Equipment Utilized During Treatment Gait belt   Activity Tolerance Patient tolerated treatment well   Behavior During Therapy [facility] for tasks assessed/performed      Past Medical History  Diagnosis Date  . PE (pulmonary embolism)     unprovoked PE completed 6 months of warfarin, warfarin d/ced 02/12/2010, repeat PE 07/10/2010 after a long car ride and now on lifelong coumadin.  [name] Hypertension   . Pituitary microadenoma     incidental finding CT 12/2009  . Prostate cancer   . Depression   . Hyperlipidemia   . Coronary artery disease, non-occlusive     with history of MI; Cath 2008 w multivessel nonobstructive CAD  . Dementia   . GERD (gastroesophageal reflux disease)   . Scoliosis   . Anxiety     Past Surgical History  Procedure Laterality Date  . Prostatectomy    . Left heart catheterization with coronary angiogram N/A 12/10/2014    Procedure: LEFT HEART CATHETERIZATION WITH CORONARY ANGIOGRAM;  Surgeon: [name], MD;  Location: [facility] CATH LAB;  Service: Cardiovascular;  Laterality: N/A;  . Loop recorder implant N/A 12/16/2014    Procedure: LOOP RECORDER IMPLANT;  Surgeon: [name], MD;  Location: [facility] CATH LAB;  Service: Cardiovascular;  Laterality: N/A;    There were no vitals filed for this visit.  Visit Diagnosis:  Abnormality of gait  Unsteadiness  Balance problems  Weakness  generalized  Stiffness due to immobility  Falls, sequela      Subjective Assessment - 06/11/15 1113    Subjective This 81yo has had decline in moblity with "legs sticking" ~1 year ago. But rapid decline over the last month. His daughter reports increased gait abnormality, increase dementia and increased urinary incontinence. He presents for PT evaluation accomapanied by youngest daughter.   Patient is accompained by: Family member   Patient Stated Goals strength legs, balance, walk better, get in /out of chairs & bed.   Currently in Pain? No/denies            [facility] PT Assessment - 06/11/15 1100    Assessment   Medical Diagnosis Falls, Gait Abnormality   Onset Date/Surgical Date 05/01/15  MD appointment   Precautions   Precautions Fall   Restrictions   Weight Bearing Restrictions No   Balance Screen   Has the patient fallen in the past 6 months Yes   How many times? 5-6 in last month   Has the patient had a decrease in activity level because of a fear of falling?  No   Is the patient reluctant to leave their home because of a fear of falling?  No  does not go out alone   [place] residence   Living Arrangements Children;Spouse/significant other  adult daughter, wife & 81yo granddtr   Type of [place]  Home Access Stairs to enter   Entrance Stairs-Number of Steps 4   Entrance Stairs-Rails Right;Left;Cannot reach both   Home Layout Two level   Alternate Level Stairs-Number of Steps 14   Alternate Level Stairs-Rails Right  wall on left   Home Equipment Walker - 2 wheels;Shower seat - built in;Grab bars - tub/shower   Observation/Other Assessments   Focus on Therapeutic Outcomes (FOTO)  36 Functional Status   Activities of Balance Confidence Scale (ABC Scale)  31.3   Fear Avoidance Belief Questionnaire (FABQ)  61%   Posture/Postural Control   Posture/Postural Control Postural limitations   Postural Limitations Rounded  Shoulders;Forward head;Flexed trunk  sciolosis   ROM / Strength   AROM / PROM / Strength Strength   Strength   Overall Strength Deficits   Overall Strength Comments BUE & BLE tested grossly in sitting at 3-/5   Transfers   Transfers Sit to Stand;Stand to Sit   Sit to Stand 4: Min guard;With upper extremity assist;With armrests;From chair/3-in-1  uses back of legs against chair to stabliize   Five time sit to stand comments  19.10 sec   Stand to Sit 5: Supervision;With upper extremity assist;With armrests;To chair/3-in-1   Ambulation/Gait   Ambulation/Gait Yes   Ambulation/Gait Assistance 4: Min assist   Ambulation Distance (Feet) 100 Feet   Assistive device None   Gait Pattern Step-to pattern;Decreased arm swing - right;Decreased arm swing - left;Decreased stride length;Right foot flat;Left foot flat;Right flexed knee in stance;Left flexed knee in stance;Festinating;Trunk flexed;Poor foot clearance - left;Poor foot clearance - right  freezing with turns & transitions   Ambulation Surface Indoor;Level   Gait velocity 1.72 ft/sec   Stairs Yes   Stairs Assistance 4: Min guard   Stair Management Technique One rail Right;Forwards;Step to pattern  2 hands on right rail similar to home   Number of Stairs 4   Standardized Balance Assessment   Standardized Balance Assessment Berg Balance Test;Timed Up and Go Test;Five Times Sit to Stand   Berg Balance Test   Sit to Stand Needs minimal aid to stand or to stabilize   Standing Unsupported Able to stand 2 minutes with supervision   Sitting with Back Unsupported but Feet Supported on Floor or Stool Able to sit safely and securely 2 minutes   Stand to Sit Uses backs of legs against chair to control descent   Transfers Able to transfer safely, definite need of hands   Standing Unsupported with Eyes Closed Unable to keep eyes closed 3 seconds but stays steady   Standing Ubsupported with Feet Together Needs help to attain position and unable to  hold for 15 seconds   From Standing, Reach Forward with Outstretched Arm Loses balance while trying/requires external support   From Standing Position, Pick up Object from Floor Unable to try/needs assist to keep balance   From Standing Position, Turn to Look Behind Over each Shoulder Needs assist to keep from losing balance and falling   Turn 360 Degrees Needs assistance while turning   Standing Unsupported, Alternately Place Feet on Step/Stool Needs assistance to keep from falling or unable to try   Standing Unsupported, One Foot in [employer] balance while stepping or standing   Standing on One Leg Unable to try or needs assist to prevent fall   Total Score 14   Timed Up and Go Test   Normal TUG (seconds) 23.97  PT Short Term Goals - 06/26/2015 1100    PT SHORT TERM GOAL #1   Title Patient verbalizes fall prevention strategies. (Target Date: 07/11/2015)   Time 4   Period Weeks   Status New   PT SHORT TERM GOAL #2   Title Patient demonstrates understanding of [place] exercise program. (Target Date: 07/11/2015)   Time 4   Period Weeks   Status New   PT SHORT TERM GOAL #3   Title Patient ambulates with rollator walker 150' with supervision to initiate safer community mobility. (Target Date: 07/11/2015)   Time 4   Period Weeks   Status New           PT Long Term Goals - 2015/06/26 1100    PT LONG TERM GOAL #1   Title Patient & daughter demonstrate / verbalioize understanding of ongoing HEP / fitness plan. (Target Date: 08/08/2015)   Time 8   Period Weeks   Status New   PT LONG TERM GOAL #2   Title Five times sit to stand <15 sec (Target Date: 08/08/2015)   Time 8   Period Weeks   Status New   PT LONG TERM GOAL #3   Title Patient ambulates 250' with LRAD with SBA safely. (Target Date: 08/08/2015)   Time 8   Period Weeks   Status New   PT LONG TERM GOAL #4   Title Patient negotiates ramps, curbs and stairs with LRAD with  supervision. (Target Date: 08/08/2015)   Time 8   Period Weeks   Status New   PT LONG TERM GOAL #5   Title Patient ambulates around furniture carrying cup of water with LRAD modified independent. (Target Date: 08/08/2015)   Time 8   Period Weeks   Status New               Plan - 2015-06-26 1100    Clinical Impression Statement This 81yo male has had an increased incidents of falls and instability. Berg Balance 14/56, Timed Up & GO 23.97sec, 5 times sit to stand 19.10sec and Gait Velocity of 1.72 ft/sec all indicate a high risk of falls. Patient would benefit from skilled PT to improve safety with instruction in fall prevention strategies, proper exercise program and gait /balance activities.    Pt will benefit from skilled therapeutic intervention in order to improve on the following deficits Abnormal gait;Decreased activity tolerance;Decreased balance;Decreased endurance;Decreased mobility;Decreased safety awareness;Decreased range of motion;Decreased strength;Impaired flexibility   Rehab Potential Good   PT Frequency 2x / week   PT Duration 8 weeks   PT Treatment/Interventions ADLs/Self Care Home Management;DME Instruction;Gait training;Stair training;Functional mobility training;Therapeutic activities;Therapeutic exercise;Balance training;Neuromuscular re-education;Patient/family education   PT Next Visit Plan Gait training with RW, assess with rollator for community,    PT Home Exercise Plan [place] program          G-Codes - 06/26/2015 1100    Functional Assessment Tool Used Berg Balance 14/56, Timed Up & Go 23.97   Functional Limitation Mobility: Walking and moving around   Mobility: Walking and Moving Around Current Status ([id number]) At least 40 percent but less than 60 percent impaired, limited or restricted   Mobility: Walking and Moving Around Goal Status [phone]) At least 20 percent but less than 40 percent impaired, limited or restricted       Problem List Patient Active  Problem List   Diagnosis Date Noted  . Leukopenia 04/16/2015  . Normocytic anemia 04/16/2015  . Chronic systolic heart failure 82/95/6213  . Falls frequently  04/16/2015  . Onychomycosis 04/15/2015  . CAD (coronary artery disease) 01/16/2015  . Faintness   . 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 Syncope 12/07/2014  . Healthcare maintenance 10/10/2014  . Somnolence, daytime 10/01/2014  . Acute pain of right hip 02/13/2014  . Lumbosacral spondylosis without myelopathy 09/24/2013  . Symptomatic bradycardia 02/20/2013  . Generalized ischemic cerebrovascular disease 02/05/2013  . Depression 01/25/2013  . Penile bleeding 01/19/2013  . Constipation 01/20/2012  . Insomnia 01/20/2012  . Alzheimer's dementia 08/17/2011  . Pulmonary embolism 09/18/2010  . Encounter for long-term use of antiplatelets/antithrombotics 09/18/2010  . DJD (degenerative joint disease) 08/06/2010  . ADENOCARCINOMA, PROSTATE, HX OF 08/06/2010  . HLD (hyperlipidemia) 10/15/2009  . Essential hypertension, benign 10/15/2009  . GERD 10/15/2009    WALDRON,ROBIN PT, DPT 06/11/2015, 10:15 PM  [facility] [address] [place], [place], [address] Phone: [phone]   Fax:  [phone]

## 2015-06-13 ENCOUNTER — Ambulatory Visit (INDEPENDENT_AMBULATORY_CARE_PROVIDER_SITE_OTHER): Payer: Commercial Managed Care - HMO | Admitting: *Deleted

## 2015-06-13 ENCOUNTER — Encounter: Payer: Self-pay | Admitting: *Deleted

## 2015-06-13 DIAGNOSIS — R55 Syncope and collapse: Secondary | ICD-10-CM

## 2015-06-16 ENCOUNTER — Ambulatory Visit: Payer: Commercial Managed Care - HMO | Admitting: Physical Therapy

## 2015-06-17 ENCOUNTER — Ambulatory Visit: Payer: Commercial Managed Care - HMO | Admitting: Physical Therapy

## 2015-06-17 ENCOUNTER — Encounter: Payer: Self-pay | Admitting: Physical Therapy

## 2015-06-17 DIAGNOSIS — W19XXXS Unspecified fall, sequela: Secondary | ICD-10-CM

## 2015-06-17 DIAGNOSIS — M623 Immobility syndrome (paraplegic): Secondary | ICD-10-CM | POA: Diagnosis not present

## 2015-06-17 DIAGNOSIS — R531 Weakness: Secondary | ICD-10-CM | POA: Diagnosis not present

## 2015-06-17 DIAGNOSIS — R269 Unspecified abnormalities of gait and mobility: Secondary | ICD-10-CM | POA: Diagnosis not present

## 2015-06-17 DIAGNOSIS — Z7409 Other reduced mobility: Principal | ICD-10-CM

## 2015-06-17 DIAGNOSIS — M2560 Stiffness of unspecified joint, not elsewhere classified: Secondary | ICD-10-CM

## 2015-06-17 DIAGNOSIS — R29818 Other symptoms and signs involving the nervous system: Secondary | ICD-10-CM | POA: Diagnosis not present

## 2015-06-17 DIAGNOSIS — R2681 Unsteadiness on feet: Secondary | ICD-10-CM | POA: Diagnosis not present

## 2015-06-17 DIAGNOSIS — R2689 Other abnormalities of gait and mobility: Secondary | ICD-10-CM

## 2015-06-17 NOTE — Therapy (Signed)
[place] [address] [place], [place], [address] Phone: [phone]   Fax:  [phone]  Physical Therapy Treatment  Patient Details  Name: [name] [name] MRN: [id number] Date of Birth: 02-05-1931 Referring Provider:  Francesca Oman, DO  Encounter Date: 06/17/2015      PT End of Session - 06/17/15 0858    Visit Number 2   Number of Visits 17   Date for PT Re-Evaluation 08/10/15   Authorization Type G-code every 10th visit   PT Start Time 0850   PT Stop Time 0928   PT Time Calculation (min) 38 min   Equipment Utilized During Treatment Gait belt   Activity Tolerance Patient tolerated treatment well   Behavior During Therapy [facility] for tasks assessed/performed      Past Medical History  Diagnosis Date  . PE (pulmonary embolism)     unprovoked PE completed 6 months of warfarin, warfarin d/ced 02/12/2010, repeat PE 07/10/2010 after a long car ride and now on lifelong coumadin.  [name] Hypertension   . Pituitary microadenoma     incidental finding CT 12/2009  . Prostate cancer   . Depression   . Hyperlipidemia   . Coronary artery disease, non-occlusive     with history of MI; Cath 2008 w multivessel nonobstructive CAD  . Dementia   . GERD (gastroesophageal reflux disease)   . Scoliosis   . Anxiety     Past Surgical History  Procedure Laterality Date  . Prostatectomy    . Left heart catheterization with coronary angiogram N/A 12/10/2014    Procedure: LEFT HEART CATHETERIZATION WITH CORONARY ANGIOGRAM;  Surgeon: [name], MD;  Location: [facility] CATH LAB;  Service: Cardiovascular;  Laterality: N/A;  . Loop recorder implant N/A 12/16/2014    Procedure: LOOP RECORDER IMPLANT;  Surgeon: [name], MD;  Location: [facility] CATH LAB;  Service: Cardiovascular;  Laterality: N/A;    There were no vitals filed for this visit.  Visit Diagnosis:  Stiffness due to immobility  Unsteadiness  Balance problems  Weakness  generalized  Falls, sequela      Subjective Assessment - 06/17/15 0857    Subjective No new complaints. No pain.    Currently in Pain? No/denies   Pain Score 0-No pain          Balance Exercises - 06/17/15 0903    OTAGO PROGRAM   Head Movements Sitting;5 reps   Neck Movements Sitting;5 reps   Back Extension Standing;5 reps   Trunk Movements Standing;5 reps   Ankle Movements Sitting;10 reps   Knee Extensor 10 reps;Weight (comment)   Knee Flexor 10 reps;Weight (comment)   Hip ABductor 10 reps;Weight (comment)   Ankle Plantorflexors 20 reps, support   Ankle Dorsiflexors 20 reps, support   Knee Bends 10 reps, support           PT Short Term Goals - 06/11/15 1100    PT SHORT TERM GOAL #1   Title Patient verbalizes fall prevention strategies. (Target Date: 07/11/2015)   Time 4   Period Weeks   Status New   PT SHORT TERM GOAL #2   Title Patient demonstrates understanding of [place] exercise program. (Target Date: 07/11/2015)   Time 4   Period Weeks   Status New   PT SHORT TERM GOAL #3   Title Patient ambulates with rollator walker 150' with supervision to initiate safer community mobility. (Target Date: 07/11/2015)   Time 4   Period Weeks   Status New  PT Long Term Goals - 06/11/15 1100    PT LONG TERM GOAL #1   Title Patient & daughter demonstrate / verbalioize understanding of ongoing HEP / fitness plan. (Target Date: 08/08/2015)   Time 8   Period Weeks   Status New   PT LONG TERM GOAL #2   Title Five times sit to stand <15 sec (Target Date: 08/08/2015)   Time 8   Period Weeks   Status New   PT LONG TERM GOAL #3   Title Patient ambulates 250' with LRAD with SBA safely. (Target Date: 08/08/2015)   Time 8   Period Weeks   Status New   PT LONG TERM GOAL #4   Title Patient negotiates ramps, curbs and stairs with LRAD with supervision. (Target Date: 08/08/2015)   Time 8   Period Weeks   Status New   PT LONG TERM GOAL #5   Title Patient  ambulates around furniture carrying cup of water with LRAD modified independent. (Target Date: 08/08/2015)   Time 8   Period Weeks   Status New           Plan - 06/17/15 1572    Clinical Impression Statement Initiated OTAGO today with daughter present to learn how to assist pt with HEP at home. Pt making steady progress toward goals.    Pt will benefit from skilled therapeutic intervention in order to improve on the following deficits Abnormal gait;Decreased activity tolerance;Decreased balance;Decreased endurance;Decreased mobility;Decreased safety awareness;Decreased range of motion;Decreased strength;Impaired flexibility   Rehab Potential Good   PT Frequency 2x / week   PT Duration 8 weeks   PT Treatment/Interventions ADLs/Self Care Home Management;DME Instruction;Gait training;Stair training;Functional mobility training;Therapeutic activities;Therapeutic exercise;Balance training;Neuromuscular re-education;Patient/family education   PT Next Visit Plan Complete OTAGO program education, trail gait with rollator as time allows   PT Home Exercise Plan 06/17/15: initiated [place] program today        Problem List Patient Active Problem List   Diagnosis Date Noted  . Leukopenia 04/16/2015  . Normocytic anemia 04/16/2015  . Chronic systolic heart failure 62/11/5595  . Falls frequently 04/16/2015  . Onychomycosis 04/15/2015  . CAD (coronary artery disease) 01/16/2015  . Faintness   . 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 Syncope 12/07/2014  . Healthcare maintenance 10/10/2014  . Somnolence, daytime 10/01/2014  . Acute pain of right hip 02/13/2014  . Lumbosacral spondylosis without myelopathy 09/24/2013  . Symptomatic bradycardia 02/20/2013  . Generalized ischemic cerebrovascular disease 02/05/2013  . Depression 01/25/2013  . Penile bleeding 01/19/2013  . Constipation 01/20/2012  . Insomnia 01/20/2012  . Alzheimer's dementia 08/17/2011  . Pulmonary embolism 09/18/2010  . Encounter  for long-term use of antiplatelets/antithrombotics 09/18/2010  . DJD (degenerative joint disease) 08/06/2010  . ADENOCARCINOMA, PROSTATE, HX OF 08/06/2010  . HLD (hyperlipidemia) 10/15/2009  . Essential hypertension, benign 10/15/2009  . GERD 10/15/2009    Willow Ora 06/17/2015, 12:48 PM  Willow Ora, PTA, [place] [address] [place], [place] [address] [phone] 06/17/2015, 12:48 PM

## 2015-06-18 ENCOUNTER — Ambulatory Visit: Payer: Commercial Managed Care - HMO | Admitting: Physical Therapy

## 2015-06-18 DIAGNOSIS — R531 Weakness: Secondary | ICD-10-CM

## 2015-06-18 DIAGNOSIS — R269 Unspecified abnormalities of gait and mobility: Secondary | ICD-10-CM | POA: Diagnosis not present

## 2015-06-18 DIAGNOSIS — R2689 Other abnormalities of gait and mobility: Secondary | ICD-10-CM

## 2015-06-18 DIAGNOSIS — M2560 Stiffness of unspecified joint, not elsewhere classified: Secondary | ICD-10-CM

## 2015-06-18 DIAGNOSIS — R2681 Unsteadiness on feet: Secondary | ICD-10-CM | POA: Diagnosis not present

## 2015-06-18 DIAGNOSIS — R29818 Other symptoms and signs involving the nervous system: Secondary | ICD-10-CM | POA: Diagnosis not present

## 2015-06-18 DIAGNOSIS — Z7409 Other reduced mobility: Secondary | ICD-10-CM

## 2015-06-18 DIAGNOSIS — W19XXXS Unspecified fall, sequela: Secondary | ICD-10-CM

## 2015-06-18 DIAGNOSIS — M623 Immobility syndrome (paraplegic): Secondary | ICD-10-CM | POA: Diagnosis not present

## 2015-06-18 NOTE — Patient Outreach (Signed)
[facility]) Care Management  06/18/2015  [name] [name] [birth year] [id number]   Notification from [facility], RN to close case due to goals met with [place] Management.  Thanks, Ronnell Freshwater. [place], [place] Assistant Phone: [phone] Fax: [phone]

## 2015-06-19 NOTE — Progress Notes (Signed)
Loop recorder 

## 2015-06-19 NOTE — Therapy (Signed)
[place] [address] [place], [place], [address] Phone: [phone]   Fax:  [phone]  Physical Therapy Treatment  Patient Details  Name: [name] [name] MRN: [id number] Date of Birth: 10/27/1930 Referring Provider:  Francesca Oman, DO  Encounter Date: 06/18/2015      PT End of Session - 06/18/15 1537    Visit Number 3   Number of Visits 17   Date for PT Re-Evaluation 08/10/15   Authorization Type G-code every 10th visit   PT Start Time 1533   PT Stop Time 1615   PT Time Calculation (min) 42 min   Equipment Utilized During Treatment Gait belt   Activity Tolerance Patient tolerated treatment well   Behavior During Therapy [facility] for tasks assessed/performed      Past Medical History  Diagnosis Date  . PE (pulmonary embolism)     unprovoked PE completed 6 months of warfarin, warfarin d/ced 02/12/2010, repeat PE 07/10/2010 after a long car ride and now on lifelong coumadin.  [name] Hypertension   . Pituitary microadenoma     incidental finding CT 12/2009  . Prostate cancer   . Depression   . Hyperlipidemia   . Coronary artery disease, non-occlusive     with history of MI; Cath 2008 w multivessel nonobstructive CAD  . Dementia   . GERD (gastroesophageal reflux disease)   . Scoliosis   . Anxiety     Past Surgical History  Procedure Laterality Date  . Prostatectomy    . Left heart catheterization with coronary angiogram N/A 12/10/2014    Procedure: LEFT HEART CATHETERIZATION WITH CORONARY ANGIOGRAM;  Surgeon: [name], MD;  Location: [facility] CATH LAB;  Service: Cardiovascular;  Laterality: N/A;  . Loop recorder implant N/A 12/16/2014    Procedure: LOOP RECORDER IMPLANT;  Surgeon: [name], MD;  Location[facility] CATH LAB;  Service: Cardiovascular;  Laterality: N/A;    There were no vitals filed for this visit.  Visit Diagnosis:  Unsteadiness  Balance problems  Weakness generalized  Stiffness due to  immobility  Falls, sequela  Abnormality of gait      Subjective Assessment - 06/18/15 1535    Subjective No falls.  Has not had a chance to do the HEP given yesterday at home. Woke up with pain "all over" this am around 3:00. Feeling better after taking tylenol that his daughter gave him. Pt's son in law brought him to therapy today.   Currently in Pain? Yes   Pain Score 3    Pain Location Leg   Pain Orientation Right;Left   Pain Descriptors / Indicators Aching;Sore  "weak"   Pain Onset Yesterday   Pain Frequency Rarely   Aggravating Factors  unable to state   Pain Relieving Factors tylenol (daughter gave it to him this am)          [facility] Adult PT Treatment/Exercise - 06/18/15 1600    Transfers   Sit to Stand 4: Min guard;With upper extremity assist;From bed;Multiple attempts;4: Min assist   Sit to Stand Details Visual cues for safe use of DME/AE;Visual cues/gestures for precautions/safety;Verbal cues for safe use of DME/AE;Verbal cues for precautions/safety;Verbal cues for sequencing   Sit to Stand Details (indicate cue type and reason) cues to scoot to edge, cues for hand placement. cues for use of rollator   Stand to Sit 4: Min guard;With upper extremity assist;To bed;Uncontrolled descent;4: Min assist   Stand to Sit Details (indicate cue type and reason) Tactile cues for  weight shifting;Tactile cues for placement;Visual cues for safe use of DME/AE;Verbal cues for safe use of DME/AE;Verbal cues for precautions/safety;Verbal cues for technique   Stand to Sit Details cues to back up all the way to the surface, to reach back and for rollator use/safety. assist to control descent needed.   Ambulation/Gait   Ambulation/Gait Yes   Ambulation/Gait Assistance 4: Min guard   Ambulation Distance (Feet) 230 Feet  x1, 215 x2   Assistive device Rollator   Gait Pattern Step-through pattern;Decreased stride length;Shuffle   Ambulation Surface Level;Indoor          Balance Exercises -  06/18/15 1537    OTAGO PROGRAM   Head Movements Sitting;5 reps   Neck Movements Sitting;5 reps   Back Extension Sitting;5 reps  pt unable to stay in standing without significant support    Trunk Movements Sitting;5 reps   Ankle Movements Sitting;10 reps   Knee Extensor 10 reps;Weight (comment)  3# ankle weights   Knee Flexor 10 reps;Weight (comment)  3# ankle weights   Hip ABductor 10 reps;Weight (comment)  3# ankle weights   Ankle Plantorflexors 20 reps, support   Ankle Dorsiflexors 20 reps, support   Knee Bends 10 reps, support             PT Short Term Goals - 06/11/15 1100    PT SHORT TERM GOAL #1   Title Patient verbalizes fall prevention strategies. (Target Date: 07/11/2015)   Time 4   Period Weeks   Status New   PT SHORT TERM GOAL #2   Title Patient demonstrates understanding of [place] exercise program. (Target Date: 07/11/2015)   Time 4   Period Weeks   Status New   PT SHORT TERM GOAL #3   Title Patient ambulates with rollator walker 150' with supervision to initiate safer community mobility. (Target Date: 07/11/2015)   Time 4   Period Weeks   Status New           PT Long Term Goals - 06/11/15 1100    PT LONG TERM GOAL #1   Title Patient & daughter demonstrate / verbalioize understanding of ongoing HEP / fitness plan. (Target Date: 08/08/2015)   Time 8   Period Weeks   Status New   PT LONG TERM GOAL #2   Title Five times sit to stand <15 sec (Target Date: 08/08/2015)   Time 8   Period Weeks   Status New   PT LONG TERM GOAL #3   Title Patient ambulates 250' with LRAD with SBA safely. (Target Date: 08/08/2015)   Time 8   Period Weeks   Status New   PT LONG TERM GOAL #4   Title Patient negotiates ramps, curbs and stairs with LRAD with supervision. (Target Date: 08/08/2015)   Time 8   Period Weeks   Status New   PT LONG TERM GOAL #5   Title Patient ambulates around furniture carrying cup of water with LRAD modified independent. (Target Date:  08/08/2015)   Time 8   Period Weeks   Status New           Plan - 06/18/15 1537    Clinical Impression Statement Initiated training with rollator today with pt needing less physical assistance with gait. Still needs cues on rollator safety after today's session. Pt making steady progress toward goals.   Pt will benefit from skilled therapeutic intervention in order to improve on the following deficits Abnormal gait;Decreased activity tolerance;Decreased balance;Decreased endurance;Decreased mobility;Decreased safety awareness;Decreased range of motion;Decreased  strength;Impaired flexibility   Rehab Potential Good   PT Frequency 2x / week   PT Duration 8 weeks   PT Treatment/Interventions ADLs/Self Care Home Management;DME Instruction;Gait training;Stair training;Functional mobility training;Therapeutic activities;Therapeutic exercise;Balance training;Neuromuscular re-education;Patient/family education   PT Next Visit Plan Complete OTAGO program education, gait with rollator as time allows   PT Home Exercise Plan 06/17/15: initiated [place] program today        Problem List Patient Active Problem List   Diagnosis Date Noted  . Leukopenia 04/16/2015  . Normocytic anemia 04/16/2015  . Chronic systolic heart failure 57/09/7791  . Falls frequently 04/16/2015  . Onychomycosis 04/15/2015  . CAD (coronary artery disease) 01/16/2015  . Faintness   . 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 Syncope 12/07/2014  . Healthcare maintenance 10/10/2014  . Somnolence, daytime 10/01/2014  . Acute pain of right hip 02/13/2014  . Lumbosacral spondylosis without myelopathy 09/24/2013  . Symptomatic bradycardia 02/20/2013  . Generalized ischemic cerebrovascular disease 02/05/2013  . Depression 01/25/2013  . Penile bleeding 01/19/2013  . Constipation 01/20/2012  . Insomnia 01/20/2012  . Alzheimer's dementia 08/17/2011  . Pulmonary embolism 09/18/2010  . Encounter for long-term use of  antiplatelets/antithrombotics 09/18/2010  . DJD (degenerative joint disease) 08/06/2010  . ADENOCARCINOMA, PROSTATE, HX OF 08/06/2010  . HLD (hyperlipidemia) 10/15/2009  . Essential hypertension, benign 10/15/2009  . GERD 10/15/2009    Willow Ora 06/19/2015, 4:15 PM  Willow Ora, PTA, [place] [address], [place], [place] [address] [phone] 06/19/2015, 4:15 PM

## 2015-06-23 ENCOUNTER — Ambulatory Visit: Payer: Commercial Managed Care - HMO | Admitting: Physical Therapy

## 2015-06-23 ENCOUNTER — Encounter: Payer: Self-pay | Admitting: Internal Medicine

## 2015-06-23 ENCOUNTER — Ambulatory Visit (INDEPENDENT_AMBULATORY_CARE_PROVIDER_SITE_OTHER): Payer: Commercial Managed Care - HMO | Admitting: Internal Medicine

## 2015-06-23 VITALS — BP 110/68 | HR 72 | Ht 67.0 in | Wt 153.0 lb

## 2015-06-23 DIAGNOSIS — Z4509 Encounter for adjustment and management of other cardiac device: Secondary | ICD-10-CM

## 2015-06-23 DIAGNOSIS — R55 Syncope and collapse: Secondary | ICD-10-CM

## 2015-06-23 LAB — CUP PACEART INCLINIC DEVICE CHECK
Date Time Interrogation Session: 20161003155558
MDC IDC SET ZONE DETECTION INTERVAL: 2000 ms
Zone Setting Detection Interval: 3000 ms
Zone Setting Detection Interval: 400 ms

## 2015-06-23 NOTE — Patient Instructions (Addendum)
Medication Instructions:  Your physician recommends that you continue on your current medications as directed. Please refer to the Current Medication list given to you today.  Labwork: None ordered  Testing/Procedures: None ordered  Follow-Up: Your physician wants you to follow-up in: 1 year with Dr. Klein.  You will receive a reminder letter in the mail two months in advance. If you don't receive a letter, please call our office to schedule the follow-up appointment.  Any Other Special Instructions Will Be Listed Below (If Applicable). Thank you for choosing [facility]!!        

## 2015-06-23 NOTE — Progress Notes (Signed)
Electrophysiology Office Note   Date:  06/23/2015   ID:  [name] [name], DOB 08/04/1931, MRN [id number]  PCP:  [name], DO  Cardiologist:   Primary Electrophysiologist:   [name], MD    No chief complaint on file.    History of Present Illness: [name] [name] is a 81 y.o. male     seen in follow-up for syncope and some degrees of bradycardia. After lengthy discussions, not withstanding underlying conduction system disease and identification frequent PVCs as potential explanation for his "bradycardia", it was elected to put in an event recorder.   Today, he denies  symptoms of  chest pain, shortness of breath, orthopnea, PND, lower extremity edema, bleeding, or neurologic sequela.  he has  Complaintsof , lightheadedness presyncope and has intercurrently been diagnosed with Parkinson's.    The patient is tolerating medications without difficulties and is otherwise without complaint today.    Past Medical History  Diagnosis Date  . PE (pulmonary embolism)     unprovoked PE completed 6 months of warfarin, warfarin d/ced 02/12/2010, repeat PE 07/10/2010 after a long car ride and now on lifelong coumadin.  [name] Hypertension   . Pituitary microadenoma ([place])     incidental finding CT 12/2009  . Prostate cancer ([name])   . Depression   . Hyperlipidemia   . Coronary artery disease, non-occlusive     with history of MI; Cath 2008 w multivessel nonobstructive CAD  . Dementia   . GERD (gastroesophageal reflux disease)   . Scoliosis   . Anxiety    Past Surgical History  Procedure Laterality Date  . Prostatectomy    . Left heart catheterization with coronary angiogram N/A 12/10/2014    Procedure: LEFT HEART CATHETERIZATION WITH CORONARY ANGIOGRAM;  Surgeon: [name], MD;  Location: [facility] CATH LAB;  Service: Cardiovascular;  Laterality: N/A;  . Loop recorder implant N/A 12/16/2014    Procedure: LOOP RECORDER IMPLANT;  Surgeon: [name], MD;  Location: [facility] CATH LAB;   Service: Cardiovascular;  Laterality: N/A;     Current Outpatient Prescriptions  Medication Sig Dispense Refill  . acetaminophen (TYLENOL) 500 MG tablet Take 500 mg by mouth every 4 (four) hours as needed for mild pain or headache.    [name] amLODipine (NORVASC) 2.5 MG tablet Take 1 tablet (2.5 mg total) by mouth daily. 30 tablet 6  . aspirin EC 81 MG EC tablet Take 1 tablet (81 mg total) by mouth daily.    [name] atorvastatin (LIPITOR) 40 MG tablet TAKE 1 TABLET (40 MG TOTAL) BY MOUTH EVERY EVENING. 90 tablet 1  . b complex vitamins tablet Take 1 tablet by mouth daily.    . Calcium Carbonate-Vitamin D (CALCIUM-VITAMIN D) 500-200 MG-UNIT per tablet Take 1 tablet by mouth daily.    . carbidopa-levodopa (SINEMET IR) 25-100 MG per tablet Take 1 tablet by mouth 2 (two) times daily. 60 tablet 3  . fish oil-omega-3 fatty acids 1000 MG capsule Take 2 g by mouth daily.     . furosemide (LASIX) 20 MG tablet Take 1 tablet (20 mg total) by mouth daily.    . Multiple Vitamins-Minerals (MULTIVITAMIN WITH MINERALS) tablet Take 1 tablet by mouth daily.    [name] NAMENDA XR 28 MG CP24 24 hr capsule TAKE 1 CAPSULE BY MOUTH DAILY 30 capsule 3  . omeprazole (PRILOSEC) 20 MG capsule TAKE 1 CAPSULE (20 MG TOTAL) BY MOUTH DAILY. 90 capsule 0  . potassium chloride (K-DUR) 10 MEQ tablet Take 1 tablet (10 mEq  total) by mouth as directed. 1st dose take 2 tabs = 20 meq then decrease to 1 tab = 10 meq daily (Patient taking differently: Take 10 mEq by mouth daily. 1st dose take 2 tabs = 20 meq then decrease to 1 tab = 10 meq daily) 30 tablet 11  . vitamin C (ASCORBIC ACID) 500 MG tablet Take 500 mg by mouth daily.    [name] warfarin (COUMADIN) 5 MG tablet Take as directed. 40 tablet 2   No current facility-administered medications for this visit.    Allergies:   Aricept and Other   Social History:  The patient  reports that he has never smoked. He has never used smokeless tobacco. He reports that he does not drink alcohol or use illicit  drugs.   Family History:  The patient's    family history includes Cervical cancer in his other; Dementia in his mother; Hypertension in his child and father; Lung cancer in his other; Stroke in his other. There is no history of Heart attack.    ROS:  Please see the history of present illness.   Otherwise, review of systems is negative *.    PHYSICAL EXAM: VS:  BP 110/68 mmHg  Pulse 72  Ht 5\' 7"  (1.702 m)  Wt 153 lb (69.4 kg)  BMI 23.96 kg/m2 , BMI Body mass index is 23.96 kg/(m^2). GEN: Well nourished, well developed, in no acute distress HEENT: normal Neck: JVD fla  carotid bruits, or masses Cardiac:  *R RR; 2/6  murmur  rubs, +S4  Respiratory:  clear to auscultation bilaterally, normal work of breathing Back without kyphosis or CVAT GI: soft, nontender, nondistended, + BS MS: no deformity or atrophy Skin: warm and dry,   Extremities No Clubbing cyanosis  Edema Neuro:  Strength and sensation are intact Psych: euthymic mood, full affect  EKG:  EKG   ordered today. Sinus rhythm at 72 Intervals 18/15/42 Right bundle branch block and frequent PVCs with a left bundle inferior axis morphology with a QS in aVR and  monophasic R wave in aVL     Recent Labs: 12/07/2014: B Natriuretic Peptide 90.9 12/18/2014: TSH 0.504 04/14/2015: Hemoglobin 12.0*; Platelets 150.0 04/15/2015: ALT 21 04/23/2015: BUN 15; Creatinine, Ser 1.11; Potassium 3.9; Sodium 142    Lipid Panel     Component Value Date/Time   CHOL 207* 06/30/2012 1153   TRIG 129 06/30/2012 1153   HDL 56 06/30/2012 1153   CHOLHDL 3.7 06/30/2012 1153   VLDL 26 06/30/2012 1153   LDLCALC 125* 06/30/2012 1153     Wt Readings from Last 3 Encounters:  06/23/15 153 lb (69.4 kg)  05/29/15 157 lb 11.2 oz (71.532 kg)  05/19/15 156 lb (70.761 kg)      Other studies Reviewed: Additional studies/ records that were reviewed today include LHC 12/10/14 Left main: Large caliber normal long vessel which bifurcated into an LAD and left  circumflex vessel.  LAD: Diagonal prox 40-50% (small). LAD after Dx 50%; mid LAD with significant mid systolic bridging with narrowing up to 80% during systole that improved with IC nitroglycerin and with diastole 20% narrowed. Left circumflex: Small tortuous vessel which gave rise to 2 OM branches.  Right coronary artery: Large dominant normal vessel that supplied the PDA and PLA vessel. Left ventriculography: EF 50-55%. There was ectopy without definitive wall motion abnormalities.  Echo 12/09/14 - EF 40% to 45%. [facility] 12/10/14 Left main: Large caliber normal long vessel which bifurcated into an LAD and left circumflex vessel.  LAD: Diagonal prox 40-50% (small). LAD after Dx 50%; mid LAD with significant mid systolic bridging with narrowing up to 80% during systole that improved with IC nitroglycerin and with diastole 20% narrowed. Left circumflex: Small tortuous vessel which gave rise to 2 OM branches.  Right coronary artery: Large dominant normal vessel that supplied the PDA and PLA vessel. Left ventriculography: EF 50-55%. There was ectopy without definitive wall motion abnormalities.      ASSESSMENT AND PLAN:   1. Syncope with symptomatic bradycardia  .  2. CAD      3. Recurrent PE    4. HTN  5. Parkinson's 6. Orthostatic lightheadedness  Stable No change required today  No recurrent syncope    Blood pressure well controlled.  PVCs are asymptomatic as best as we can tell  Have reviewed the physiology of orthostatic intolerance in the context of his Parkinson's and he recommended the use of an abdominal binder   Current medicines are reviewed at length with the patient today.   The patient does not have concerns regarding his medicines.  The following changes were made today:  none  Labs/ tests ordered today include:    No orders of the defined types were placed in this encounter.     Disposition:   FU with me  12  months   Signed, [name], MD  06/23/2015 12:23 PM     [place] [address] [place] [address] [phone] (office) [phone] (fax)

## 2015-06-25 ENCOUNTER — Other Ambulatory Visit: Payer: Commercial Managed Care - HMO

## 2015-06-25 ENCOUNTER — Ambulatory Visit: Payer: Commercial Managed Care - HMO | Attending: Internal Medicine | Admitting: Physical Therapy

## 2015-06-25 ENCOUNTER — Ambulatory Visit: Payer: Commercial Managed Care - HMO | Admitting: Physical Therapy

## 2015-06-25 DIAGNOSIS — M623 Immobility syndrome (paraplegic): Secondary | ICD-10-CM | POA: Diagnosis not present

## 2015-06-25 DIAGNOSIS — R29818 Other symptoms and signs involving the nervous system: Secondary | ICD-10-CM | POA: Insufficient documentation

## 2015-06-25 DIAGNOSIS — R2681 Unsteadiness on feet: Secondary | ICD-10-CM | POA: Insufficient documentation

## 2015-06-25 DIAGNOSIS — R269 Unspecified abnormalities of gait and mobility: Secondary | ICD-10-CM | POA: Diagnosis not present

## 2015-06-25 DIAGNOSIS — R531 Weakness: Secondary | ICD-10-CM | POA: Insufficient documentation

## 2015-06-25 DIAGNOSIS — K219 Gastro-esophageal reflux disease without esophagitis: Secondary | ICD-10-CM | POA: Diagnosis not present

## 2015-06-25 DIAGNOSIS — W19XXXS Unspecified fall, sequela: Secondary | ICD-10-CM | POA: Insufficient documentation

## 2015-06-25 LAB — CUP PACEART REMOTE DEVICE CHECK: MDC IDC SESS DTM: 20160924230521

## 2015-06-25 NOTE — Therapy (Signed)
[facility] [address] [place], [place], [address] Phone: [phone]   Fax:  [phone]  Physical Therapy Treatment  Patient Details  Name: [name] [name] MRN: [id number] Date of Birth: 04/29/1931 Referring Provider:  Francesca Oman, DO  Encounter Date: 06/25/2015      PT End of Session - 06/25/15 2007    Visit Number 4   Number of Visits 17   Date for PT Re-Evaluation 08/10/15   Authorization Type G-code every 10th visit   PT Start Time 1407   PT Stop Time 1451   PT Time Calculation (min) 44 min   Activity Tolerance Patient tolerated treatment well   Behavior During Therapy [facility] for tasks assessed/performed      Past Medical History  Diagnosis Date  . PE (pulmonary embolism)     unprovoked PE completed 6 months of warfarin, warfarin d/ced 02/12/2010, repeat PE 07/10/2010 after a long car ride and now on lifelong coumadin.  [name] Hypertension   . Pituitary microadenoma ([place])     incidental finding CT 12/2009  . Prostate cancer ([place])   . Depression   . Hyperlipidemia   . Coronary artery disease, non-occlusive     with history of MI; Cath 2008 w multivessel nonobstructive CAD  . Dementia   . GERD (gastroesophageal reflux disease)   . Scoliosis   . Anxiety     Past Surgical History  Procedure Laterality Date  . Prostatectomy    . Left heart catheterization with coronary angiogram N/A 12/10/2014    Procedure: LEFT HEART CATHETERIZATION WITH CORONARY ANGIOGRAM;  Surgeon: [name], MD;  Location: [facility] CATH LAB;  Service: Cardiovascular;  Laterality: N/A;  . Loop recorder implant N/A 12/16/2014    Procedure: LOOP RECORDER IMPLANT;  Surgeon: [name], MD;  Location: [facility] CATH LAB;  Service: Cardiovascular;  Laterality: N/A;    There were no vitals filed for this visit.  Visit Diagnosis:  Unsteadiness  Abnormality of gait      Subjective Assessment - 06/25/15 1412    Subjective Pt denies falls. Pt's  daugter, pt has been performing [place] HEP at home.    Patient is accompained by: Family member   Patient Stated Goals strength legs, balance, walk better, get in /out of chairs & bed.   Currently in Pain? Yes   Pain Score 3    Pain Location Rib cage   Pain Orientation Right;Left   Pain Descriptors / Indicators Aching   Pain Type Chronic pain  per daughter   Pain Onset More than a month ago   Pain Frequency Constant   Aggravating Factors  unable to describe   Pain Relieving Factors tylenol   Multiple Pain Sites No                              Balance Exercises - 06/25/15 1416    [place] PROGRAM   Backwards Walking Support   Walking and Turning Around --  Not performed due to safety concern   Sideways Walking Assistive device  single UE support on countertop   Tandem Stance 10 seconds, support   Tandem Walk Support   One Leg Stand 10 seconds, support   Heel Walking Support   Toe Walk Support   Heel Toe Walking Backward --  Not performed due to safety concern   Sit to Stand 5 reps, one support  7 reps   Stair Walking 12  Overall OTAGO Comments Pt required cueing for postural awareness, technique. Pt must negotiate 14 stairs multiple times per day in home; therefore, instructed family in lateral stair negotiation technique for increased safety with verbal understanding from daughter.           PT Education - 06/25/15 2005    Education provided Yes   Education Details [employer].   Person(s) Educated Patient;Child(ren)   Methods Explanation;Demonstration;Tactile cues;Verbal cues;Handout   Comprehension Verbalized understanding;Verbal cues required;Tactile cues required          PT Short Term Goals - 06/11/15 1100    PT SHORT TERM GOAL #1   Title Patient verbalizes fall prevention strategies. (Target Date: 07/11/2015)   Time 4   Period Weeks   Status New   PT SHORT TERM GOAL #2   Title Patient demonstrates understanding of [place] exercise  program. (Target Date: 07/11/2015)   Time 4   Period Weeks   Status New   PT SHORT TERM GOAL #3   Title Patient ambulates with rollator walker 150' with supervision to initiate safer community mobility. (Target Date: 07/11/2015)   Time 4   Period Weeks   Status New           PT Long Term Goals - 06/11/15 1100    PT LONG TERM GOAL #1   Title Patient & daughter demonstrate / verbalioize understanding of ongoing HEP / fitness plan. (Target Date: 08/08/2015)   Time 8   Period Weeks   Status New   PT LONG TERM GOAL #2   Title Five times sit to stand <15 sec (Target Date: 08/08/2015)   Time 8   Period Weeks   Status New   PT LONG TERM GOAL #3   Title Patient ambulates 250' with LRAD with SBA safely. (Target Date: 08/08/2015)   Time 8   Period Weeks   Status New   PT LONG TERM GOAL #4   Title Patient negotiates ramps, curbs and stairs with LRAD with supervision. (Target Date: 08/08/2015)   Time 8   Period Weeks   Status New   PT LONG TERM GOAL #5   Title Patient ambulates around furniture carrying cup of water with LRAD modified independent. (Target Date: 08/08/2015)   Time 8   Period Weeks   Status New               Plan - 06/25/15 2008    Clinical Impression Statement Skilled session focused on completing instruction of [employer]. Daughter engaged  in PT session and verbalized understanding of all exercises and cueing. Pt tolerated treatment well, requiring intermittent seated rest breaks. Continue per POC.   Pt will benefit from skilled therapeutic intervention in order to improve on the following deficits Abnormal gait;Decreased activity tolerance;Decreased balance;Decreased endurance;Decreased mobility;Decreased safety awareness;Decreased range of motion;Decreased strength;Impaired flexibility   Rehab Potential Good   PT Frequency 2x / week   PT Duration 8 weeks   PT Treatment/Interventions ADLs/Self Care Home Management;DME Instruction;Gait training;Stair  training;Functional mobility training;Therapeutic activities;Therapeutic exercise;Balance training;Neuromuscular re-education;Patient/family education   PT Next Visit Plan Trial gait with rollator   PT Home Exercise Plan 10/5: completed instruction of OTAGO   Consulted and Agree with Plan of Care Patient;Family member/caregiver   Family Member Consulted daughter, [name]        Problem List Patient Active Problem List   Diagnosis Date Noted  . Leukopenia 04/16/2015  . Normocytic anemia 04/16/2015  . Chronic systolic heart failure ([place]) 04/16/2015  . Falls frequently  04/16/2015  . Onychomycosis 04/15/2015  . CAD (coronary artery disease) 01/16/2015  . Faintness   . 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 Syncope 12/07/2014  . Healthcare maintenance 10/10/2014  . Somnolence, daytime 10/01/2014  . Acute pain of right hip 02/13/2014  . Lumbosacral spondylosis without myelopathy 09/24/2013  . Symptomatic bradycardia 02/20/2013  . Generalized ischemic cerebrovascular disease 02/05/2013  . Depression 01/25/2013  . Penile bleeding 01/19/2013  . Constipation 01/20/2012  . Insomnia 01/20/2012  . Alzheimer's dementia 08/17/2011  . Pulmonary embolism ([place]) 09/18/2010  . Encounter for long-term use of antiplatelets/antithrombotics 09/18/2010  . DJD (degenerative joint disease) 08/06/2010  . ADENOCARCINOMA, PROSTATE, HX OF 08/06/2010  . HLD (hyperlipidemia) 10/15/2009  . Essential hypertension, benign 10/15/2009  . GERD 10/15/2009    Billie Ruddy, PT, DPT [facility] [address] [place], [place], [address] Phone: [phone]   Fax:  [phone] 06/25/2015, 8:11 PM

## 2015-06-25 NOTE — Progress Notes (Signed)
Carelink summary report received. Battery status OK. Normal device function. No new symptom episodes, tachy episodes, brady, or pause episodes. No new AF episodes. Monthly summary reports and ROV w/ SK 06/23/15.

## 2015-06-27 ENCOUNTER — Ambulatory Visit: Payer: Commercial Managed Care - HMO | Admitting: Physical Therapy

## 2015-06-27 ENCOUNTER — Encounter: Payer: Self-pay | Admitting: Physical Therapy

## 2015-06-27 DIAGNOSIS — M623 Immobility syndrome (paraplegic): Secondary | ICD-10-CM | POA: Diagnosis not present

## 2015-06-27 DIAGNOSIS — R29818 Other symptoms and signs involving the nervous system: Secondary | ICD-10-CM | POA: Diagnosis not present

## 2015-06-27 DIAGNOSIS — W19XXXS Unspecified fall, sequela: Secondary | ICD-10-CM

## 2015-06-27 DIAGNOSIS — R2689 Other abnormalities of gait and mobility: Secondary | ICD-10-CM

## 2015-06-27 DIAGNOSIS — R531 Weakness: Secondary | ICD-10-CM | POA: Diagnosis not present

## 2015-06-27 DIAGNOSIS — R269 Unspecified abnormalities of gait and mobility: Secondary | ICD-10-CM | POA: Diagnosis not present

## 2015-06-27 DIAGNOSIS — R2681 Unsteadiness on feet: Secondary | ICD-10-CM | POA: Diagnosis not present

## 2015-06-27 LAB — H. PYLORI ANTIGEN, STOOL: H PYLORI AG STL: NEGATIVE

## 2015-06-27 NOTE — Therapy (Signed)
[facility] [address] [place], [place], [address] Phone: [phone]   Fax:  [phone]  Physical Therapy Treatment  Patient Details  Name: [name] [name] MRN: [id number] Date of Birth: 05/19/1931 Referring Provider:  Francesca Oman, DO  Encounter Date: 06/27/2015   06/27/15 1408  PT Visits / Re-Eval  Visit Number 5  Number of Visits 17  Date for PT Re-Evaluation 08/10/15  Authorization  Authorization Type G-code every 10th visit  PT Time Calculation  PT Start Time 1403  PT Stop Time 1442  PT Time Calculation (min) 39 min  PT - End of Session  Activity Tolerance Patient tolerated treatment well  Behavior During Therapy [facility] for tasks assessed/performed     Past Medical History  Diagnosis Date  . PE (pulmonary embolism)     unprovoked PE completed 6 months of warfarin, warfarin d/ced 02/12/2010, repeat PE 07/10/2010 after a long car ride and now on lifelong coumadin.  [name] Hypertension   . Pituitary microadenoma ([place])     incidental finding CT 12/2009  . Prostate cancer ([place])   . Depression   . Hyperlipidemia   . Coronary artery disease, non-occlusive     with history of MI; Cath 2008 w multivessel nonobstructive CAD  . Dementia   . GERD (gastroesophageal reflux disease)   . Scoliosis   . Anxiety     Past Surgical History  Procedure Laterality Date  . Prostatectomy    . Left heart catheterization with coronary angiogram N/A 12/10/2014    Procedure: LEFT HEART CATHETERIZATION WITH CORONARY ANGIOGRAM;  Surgeon: [name], MD;  Location: [facility] CATH LAB;  Service: Cardiovascular;  Laterality: N/A;  . Loop recorder implant N/A 12/16/2014    Procedure: LOOP RECORDER IMPLANT;  Surgeon: [name], MD;  Location: [facility] CATH LAB;  Service: Cardiovascular;  Laterality: N/A;    There were no vitals filed for this visit.  Visit Diagnosis:   Unsteadiness   .  Abnormality of gait    Balance problems     Weakness generalized   .  Falls, sequela         Subjective Assessment - 06/27/15 1406    Subjective No new complaints. No falls to report. Some bil shoulder and lower back pain.    Currently in Pain? Yes   Pain Score 5    Pain Location Other (Comment)  bil shoulder/low back   Pain Orientation Right;Left;Lower   Pain Descriptors / Indicators Aching   Pain Type Chronic pain   Pain Onset More than a month ago   Pain Frequency Constant   Aggravating Factors  cold/rainy weather   Pain Relieving Factors rest, tylenol            [facility] Adult PT Treatment/Exercise - 06/27/15 1420    Transfers   Sit to Stand 4: Min guard;With upper extremity assist;From bed;Multiple attempts;4: Min assist   Stand to Sit 4: Min guard;With upper extremity assist;To bed;Uncontrolled descent;4: Min assist   Ambulation/Gait   Ambulation/Gait Yes   Ambulation/Gait Assistance 4: Min guard;4: Min assist   Ambulation/Gait Assistance Details cues on posture,  increased step height and for increased step/stride length with gait   Ambulation Distance (Feet) 360 Feet  x 2 reps   Assistive device Rollator   Gait Pattern Step-through pattern;Decreased stride length;Shuffle   Ambulation Surface Level;Indoor     Neuro Re-ed: Single leg stance activities: min assist for balance with cues to task and for ex form  With  4 inch box: alternating fwd and cross toe taps x 10 each bil legs.  With foam bubbles: alternating fwd toe taps and cross toe taps x 10 each bil legs          PT Short Term Goals - 06/11/15 1100    PT SHORT TERM GOAL #1   Title Patient verbalizes fall prevention strategies. (Target Date: 07/11/2015)   Time 4   Period Weeks   Status New   PT SHORT TERM GOAL #2   Title Patient demonstrates understanding of [place] exercise program. (Target Date: 07/11/2015)   Time 4   Period Weeks   Status New   PT SHORT TERM GOAL #3   Title Patient ambulates with rollator walker 150' with  supervision to initiate safer community mobility. (Target Date: 07/11/2015)   Time 4   Period Weeks   Status New           PT Long Term Goals - 06/11/15 1100    PT LONG TERM GOAL #1   Title Patient & daughter demonstrate / verbalioize understanding of ongoing HEP / fitness plan. (Target Date: 08/08/2015)   Time 8   Period Weeks   Status New   PT LONG TERM GOAL #2   Title Five times sit to stand <15 sec (Target Date: 08/08/2015)   Time 8   Period Weeks   Status New   PT LONG TERM GOAL #3   Title Patient ambulates 250' with LRAD with SBA safely. (Target Date: 08/08/2015)   Time 8   Period Weeks   Status New   PT LONG TERM GOAL #4   Title Patient negotiates ramps, curbs and stairs with LRAD with supervision. (Target Date: 08/08/2015)   Time 8   Period Weeks   Status New   PT LONG TERM GOAL #5   Title Patient ambulates around furniture carrying cup of water with LRAD modified independent. (Target Date: 08/08/2015)   Time 8   Period Weeks   Status New        06/27/15 1408  Plan  Pt will benefit from skilled therapeutic intervention in order to improve on the following deficits Abnormal gait;Decreased activity tolerance;Decreased balance;Decreased endurance;Decreased mobility;Decreased safety awareness;Decreased range of motion;Decreased strength;Impaired flexibility  Rehab Potential Good  PT Frequency 2x / week  PT Duration 8 weeks  PT Treatment/Interventions ADLs/Self Care Home Management;DME Instruction;Gait training;Stair training;Functional mobility training;Therapeutic activities;Therapeutic exercise;Balance training;Neuromuscular re-education;Patient/family education  PT Home Exercise Plan 10/5: completed instruction of OTAGO  Consulted and Agree with Plan of Care Patient;Family member/caregiver  Family Member Consulted daughter, [name]     Problem List Patient Active Problem List   Diagnosis Date Noted  . Leukopenia 04/16/2015  . Normocytic anemia 04/16/2015   . Chronic systolic heart failure ([place]) 04/16/2015  . Falls frequently 04/16/2015  . Onychomycosis 04/15/2015  . CAD (coronary artery disease) 01/16/2015  . Faintness   . 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 Syncope 12/07/2014  . Healthcare maintenance 10/10/2014  . Somnolence, daytime 10/01/2014  . Acute pain of right hip 02/13/2014  . Lumbosacral spondylosis without myelopathy 09/24/2013  . Symptomatic bradycardia 02/20/2013  . Generalized ischemic cerebrovascular disease 02/05/2013  . Depression 01/25/2013  . Penile bleeding 01/19/2013  . Constipation 01/20/2012  . Insomnia 01/20/2012  . Alzheimer's dementia 08/17/2011  . Pulmonary embolism ([place]) 09/18/2010  . Encounter for long-term use of antiplatelets/antithrombotics 09/18/2010  . DJD (degenerative joint disease) 08/06/2010  . ADENOCARCINOMA, PROSTATE, HX OF 08/06/2010  . HLD (hyperlipidemia) 10/15/2009  . Essential hypertension,  benign 10/15/2009  . GERD 10/15/2009    Willow Ora 06/27/2015, 2:31 PM  Willow Ora, PTA, [facility] [address] [place], [place] [address] [phone] 06/27/2015, 2:32 PM

## 2015-06-30 ENCOUNTER — Ambulatory Visit: Payer: Commercial Managed Care - HMO | Admitting: Physical Therapy

## 2015-07-01 ENCOUNTER — Encounter: Payer: Self-pay | Admitting: Internal Medicine

## 2015-07-02 ENCOUNTER — Ambulatory Visit: Payer: Commercial Managed Care - HMO | Admitting: Physical Therapy

## 2015-07-03 ENCOUNTER — Encounter: Payer: Self-pay | Admitting: Physical Therapy

## 2015-07-03 ENCOUNTER — Telehealth: Payer: Self-pay | Admitting: Physical Therapy

## 2015-07-03 ENCOUNTER — Ambulatory Visit: Payer: Commercial Managed Care - HMO | Admitting: Physical Therapy

## 2015-07-03 ENCOUNTER — Telehealth: Payer: Self-pay | Admitting: Cardiology

## 2015-07-03 DIAGNOSIS — W19XXXS Unspecified fall, sequela: Secondary | ICD-10-CM

## 2015-07-03 DIAGNOSIS — M623 Immobility syndrome (paraplegic): Secondary | ICD-10-CM | POA: Diagnosis not present

## 2015-07-03 DIAGNOSIS — R269 Unspecified abnormalities of gait and mobility: Secondary | ICD-10-CM

## 2015-07-03 DIAGNOSIS — R2689 Other abnormalities of gait and mobility: Secondary | ICD-10-CM

## 2015-07-03 DIAGNOSIS — R2681 Unsteadiness on feet: Secondary | ICD-10-CM | POA: Diagnosis not present

## 2015-07-03 DIAGNOSIS — R531 Weakness: Secondary | ICD-10-CM

## 2015-07-03 DIAGNOSIS — R29818 Other symptoms and signs involving the nervous system: Secondary | ICD-10-CM | POA: Diagnosis not present

## 2015-07-03 NOTE — Telephone Encounter (Signed)
Symptom episode shows PVCs. No sustained events or consecutive PVCs. No pauses or brady episodes. Family has been getting heart rate from automatic home BP cuff. I explained PVCs have a weaker electrical signal which gives the misinterpretation of a slower heart rate. Based on loop recorder data, no further action would be taken. Daughter expressed understanding.

## 2015-07-03 NOTE — Telephone Encounter (Signed)
Pts daughter is calling to report that for the past week they have been monitoring the pts HR daily and writing this down, and noted that the pts HR is anywhere between mid 30s-50s range.  Per the daughter she reports the pt has no complaints of chest pain, sob, palpitations, dizziness, pre-syncopal, or syncopal episodes at this time.  Daughter states the only complaints the pt has is that he feels weak and is more fatigued.  Per the daughter she states that she asked to speak with Dr Olin Pia nurse, for Dr [name] has been following the pt for symptomatic bradycardia and placed an event recorder in the pt.  Daughter reports that she pressed the "button as instructed," for our office to review what the pts current rate is.  Daughter reports she is unsure if she did this correct or not.  Daughter reports the pt is currently resting well in his recliner, and is in no acute distress.  Informed the daughter that I will go and speak with device to pull the pts readings from his loop recorder and show Dr [name] this for review and recommendation, for he is in the office today.  Daughter verbalized understanding and agrees with this plan.

## 2015-07-03 NOTE — Telephone Encounter (Signed)
New problem   Pt's heart rate is in the 30's per daughter calling and she wish to speak to nurse.

## 2015-07-03 NOTE — Telephone Encounter (Signed)
The patient has Parkinson's plus syndrome and appears to be declining unfortunately there is nothing much we can do at this point. I have even referred the patient for a second opinion to Dr. Rexene Alberts

## 2015-07-03 NOTE — Therapy (Signed)
[facility] [address] [place], [place], [address] Phone: [phone]   Fax:  [phone]  Physical Therapy Treatment  Patient Details  Name: [name] [name] MRN: [id number] Date of Birth: November 06, 1930 No Data Recorded  Encounter Date: 07/03/2015      PT End of Session - 07/03/15 1015    Visit Number 6   Number of Visits 17   Date for PT Re-Evaluation 08/10/15   Authorization Type G-code every 10th visit   PT Start Time 0950   PT Stop Time 1018   PT Time Calculation (min) 28 min   Activity Tolerance Patient tolerated treatment well   Behavior During Therapy [facility] for tasks assessed/performed      Past Medical History  Diagnosis Date  . PE (pulmonary embolism)     unprovoked PE completed 6 months of warfarin, warfarin d/ced 02/12/2010, repeat PE 07/10/2010 after a long car ride and now on lifelong coumadin.  [name] Hypertension   . Pituitary microadenoma ([place])     incidental finding CT 12/2009  . Prostate cancer ([place])   . Depression   . Hyperlipidemia   . Coronary artery disease, non-occlusive     with history of MI; Cath 2008 w multivessel nonobstructive CAD  . Dementia   . GERD (gastroesophageal reflux disease)   . Scoliosis   . Anxiety     Past Surgical History  Procedure Laterality Date  . Prostatectomy    . Left heart catheterization with coronary angiogram N/A 12/10/2014    Procedure: LEFT HEART CATHETERIZATION WITH CORONARY ANGIOGRAM;  Surgeon: [name], MD;  Location: [facility] CATH LAB;  Service: Cardiovascular;  Laterality: N/A;  . Loop recorder implant N/A 12/16/2014    Procedure: LOOP RECORDER IMPLANT;  Surgeon: [name], MD;  Location: [facility] CATH LAB;  Service: Cardiovascular;  Laterality: N/A;    There were no vitals filed for this visit.  Visit Diagnosis:  Unsteadiness  Abnormality of gait  Weakness generalized  Balance problems  Falls, sequela      Subjective Assessment - 07/03/15  0930    Subjective Fall in bath tub. Daughter denies injuries.   Patient is accompained by: Family member   Currently in Pain? No/denies      Gait Training: Patient ambulated 31' & 175' with rolling walker with tactile & verbal cues on posture and step length PT recommended patient use RW for all gait including home & community. Daughter verbalized understanding but reports a PT told them in past it will make him weaker. PT recommended safety to prevent falls is priority at this point. Daughter verbalized understanding.                            PT Education - 07/03/15 0950    Education provided Yes   Education Details PT recommended using bathing suit or wearing underwear with bathing as patient is resistant to bathing with caregivers or family of opposite sex.   Person(s) Educated Patient;Child(ren)   Methods Explanation   Comprehension Verbalized understanding          PT Short Term Goals - 06/11/15 1100    PT SHORT TERM GOAL #1   Title Patient verbalizes fall prevention strategies. (Target Date: 07/11/2015)   Time 4   Period Weeks   Status New   PT SHORT TERM GOAL #2   Title Patient demonstrates understanding of [place] exercise program. (Target Date: 07/11/2015)   Time 4  Period Weeks   Status New   PT SHORT TERM GOAL #3   Title Patient ambulates with rollator walker 150' with supervision to initiate safer community mobility. (Target Date: 07/11/2015)   Time 4   Period Weeks   Status New           PT Long Term Goals - 06/11/15 1100    PT LONG TERM GOAL #1   Title Patient & daughter demonstrate / verbalioize understanding of ongoing HEP / fitness plan. (Target Date: 08/08/2015)   Time 8   Period Weeks   Status New   PT LONG TERM GOAL #2   Title Five times sit to stand <15 sec (Target Date: 08/08/2015)   Time 8   Period Weeks   Status New   PT LONG TERM GOAL #3   Title Patient ambulates 250' with LRAD with SBA safely. (Target Date:  08/08/2015)   Time 8   Period Weeks   Status New   PT LONG TERM GOAL #4   Title Patient negotiates ramps, curbs and stairs with LRAD with supervision. (Target Date: 08/08/2015)   Time 8   Period Weeks   Status New   PT LONG TERM GOAL #5   Title Patient ambulates around furniture carrying cup of water with LRAD modified independent. (Target Date: 08/08/2015)   Time 8   Period Weeks   Status New               Plan - 07/03/15 1015    Clinical Impression Statement Patient was late to PT session today limiting session. Patient's daughter verbalizes understanding to allow patient to wear swim suit or underwear when bathing with family or caregiver that are male to decrease his resistance to help in bathroom. Patient needs to use a rolling walker to improve safety with gait.   Pt will benefit from skilled therapeutic intervention in order to improve on the following deficits Abnormal gait;Decreased activity tolerance;Decreased balance;Decreased endurance;Decreased mobility;Decreased safety awareness;Decreased range of motion;Decreased strength;Impaired flexibility   Rehab Potential Good   PT Frequency 2x / week   PT Duration 8 weeks   PT Treatment/Interventions ADLs/Self Care Home Management;DME Instruction;Gait training;Stair training;Functional mobility training;Therapeutic activities;Therapeutic exercise;Balance training;Neuromuscular re-education;Patient/family education   PT Next Visit Plan Assess gait with RW vs rollator walker (PT uncertain if he can learn to safely use brakes with cognition)   PT Home Exercise Plan 10/5: completed instruction of [employer]   Consulted and Agree with Plan of Care Patient;Family member/caregiver   Family Member Consulted daughter, [name]        Problem List Patient Active Problem List   Diagnosis Date Noted  . Leukopenia 04/16/2015  . Normocytic anemia 04/16/2015  . Chronic systolic heart failure ([place]) 04/16/2015  . Falls frequently  04/16/2015  . Onychomycosis 04/15/2015  . CAD (coronary artery disease) 01/16/2015  . Faintness   . 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 Syncope 12/07/2014  . Healthcare maintenance 10/10/2014  . Somnolence, daytime 10/01/2014  . Acute pain of right hip 02/13/2014  . Lumbosacral spondylosis without myelopathy 09/24/2013  . Symptomatic bradycardia 02/20/2013  . Generalized ischemic cerebrovascular disease 02/05/2013  . Depression 01/25/2013  . Penile bleeding 01/19/2013  . Constipation 01/20/2012  . Insomnia 01/20/2012  . Alzheimer's dementia 08/17/2011  . Pulmonary embolism ([place]) 09/18/2010  . Encounter for long-term use of antiplatelets/antithrombotics 09/18/2010  . DJD (degenerative joint disease) 08/06/2010  . ADENOCARCINOMA, PROSTATE, HX OF 08/06/2010  . HLD (hyperlipidemia) 10/15/2009  . Essential hypertension, benign 10/15/2009  .  GERD 10/15/2009    Shadia Larose PT, DPT 07/03/2015, 8:29 PM  [facility] [address] [place], [place], [address] Phone: [phone]   Fax:  [phone]  Name: [name] [name] MRN: [id number] Date of Birth: 02/01/1931

## 2015-07-03 NOTE — Telephone Encounter (Signed)
Dr. Leonie Man, [name]'s daughter reports a rapid decline in his gait, cognition and incontinence over the last 6 months. His gait is Parkinsonism in nature but wider base. I have only been able to work with him for 3 sessions so I can not speak to his decline but his daughter appears to be a good historian. Your input would be greatly appreciated. Please advise. Thank you, Jamey Reas, PT, DPT

## 2015-07-03 NOTE — Telephone Encounter (Signed)
Barbara in device has pulled up the pts current loop recording that was sent by the daughter today, and she will show this to one of our device techs to review and show to Dr [name] and nurse.

## 2015-07-05 ENCOUNTER — Other Ambulatory Visit: Payer: Self-pay | Admitting: Cardiology

## 2015-07-07 ENCOUNTER — Ambulatory Visit (INDEPENDENT_AMBULATORY_CARE_PROVIDER_SITE_OTHER): Payer: Commercial Managed Care - HMO | Admitting: Pharmacist

## 2015-07-07 ENCOUNTER — Encounter: Payer: Commercial Managed Care - HMO | Admitting: Physical Therapy

## 2015-07-07 DIAGNOSIS — Z7901 Long term (current) use of anticoagulants: Secondary | ICD-10-CM

## 2015-07-07 DIAGNOSIS — I2699 Other pulmonary embolism without acute cor pulmonale: Secondary | ICD-10-CM

## 2015-07-07 DIAGNOSIS — Z7902 Long term (current) use of antithrombotics/antiplatelets: Secondary | ICD-10-CM

## 2015-07-07 LAB — POCT INR: INR: 2.6

## 2015-07-07 NOTE — Telephone Encounter (Signed)
I do not see where Dr [name] has ever refilled this for the patient. Ok to refill or defer to pcp? Please advise. Thanks, MI

## 2015-07-07 NOTE — Progress Notes (Signed)
INTERNAL MEDICINE TEACHING ATTENDING ADDENDUM - Eathen Budreau M.D  Duration- indefinite, Indication- PE, INR- therapeutic. Agree with pharmacy recommendations as outlined in their note.     

## 2015-07-07 NOTE — Patient Instructions (Signed)
Patient instructed to take medications as defined in the Anti-coagulation Track section of this encounter.  Patient instructed to take today's dose.  Patient verbalized understanding of these instructions.    

## 2015-07-07 NOTE — Progress Notes (Signed)
Anti-Coagulation Progress Note  [name] [name] is a 81 y.o. male who is currently on an anti-coagulation regimen.    RECENT RESULTS: Recent results are below, the most recent result is correlated with a dose of 37.5 mg. per week: Lab Results  Component Value Date   INR 2.60 07/07/2015   INR 2.10 06/09/2015   INR 1.2 06/02/2015    ANTI-COAG DOSE: Anticoagulation Dose Instructions as of 07/07/2015      [name] Tue Wed Thu Fri Sat   New Dose 5 mg 7.5 mg 5 mg 5 mg 5 mg 5 mg 5 mg       ANTICOAG SUMMARY: Anticoagulation Episode Summary    Current INR goal 2.0-3.0  Next INR check 08/04/2015  INR from last check 2.60 (07/07/2015)  Weekly max dose   Target end date Indefinite  [facility] check location [facility]  Preferred lab   Send INR reminders to ANTICOAG IMP   Indications  Pulmonary embolism ([place]) [I26.99] Encounter for long-term use of antiplatelets/antithrombotics [Z79.02]        Comments History of multiple venous embolic episodes. Will continue to annual re-evaluate continued need for warfarin weighing risks vs. benefits.       Anticoagulation Care Providers    Provider Role Specialty Phone number   Bertha Stakes, MD  Internal Medicine [phone]      ANTICOAG TODAY: Anticoagulation Summary as of 07/07/2015    INR goal 2.0-3.0  Selected INR 2.60 (07/07/2015)  Next INR check 08/04/2015  Target end date Indefinite   Indications  Pulmonary embolism ([place]) [I26.99] Encounter for long-term use of antiplatelets/antithrombotics [Z79.02]      Anticoagulation Episode Summary    [facility] check [facility]   Preferred lab    Send INR reminders to ANTICOAG IMP   Comments History of multiple venous embolic episodes. Will continue to annual re-evaluate continued need for warfarin weighing risks vs. benefits.     Anticoagulation Care Providers    Provider Role Specialty Phone number   Bertha Stakes, MD  Internal Medicine [phone]      PATIENT  INSTRUCTIONS: Patient Instructions  Patient instructed to take medications as defined in the Anti-coagulation Track section of this encounter.  Patient instructed to take today's dose.  Patient verbalized understanding of these instructions.       FOLLOW-UP Return in 4 weeks (on 08/04/2015) for Follow up INR at 1100h.  Jorene Guest, [name] Pharm.D., CACP

## 2015-07-08 ENCOUNTER — Encounter: Payer: Self-pay | Admitting: Internal Medicine

## 2015-07-08 ENCOUNTER — Other Ambulatory Visit: Payer: Self-pay | Admitting: *Deleted

## 2015-07-08 MED ORDER — AMLODIPINE BESYLATE 2.5 MG PO TABS: ORAL_TABLET | ORAL | Status: DC

## 2015-07-08 NOTE — Telephone Encounter (Signed)
OK to refill  Thanks

## 2015-07-08 NOTE — Telephone Encounter (Signed)
Rx(s) sent to pharmacy electronically.  

## 2015-07-09 ENCOUNTER — Other Ambulatory Visit: Payer: Self-pay | Admitting: *Deleted

## 2015-07-09 ENCOUNTER — Ambulatory Visit: Payer: Commercial Managed Care - HMO | Admitting: Physical Therapy

## 2015-07-09 ENCOUNTER — Encounter: Payer: Self-pay | Admitting: Physical Therapy

## 2015-07-09 DIAGNOSIS — R531 Weakness: Secondary | ICD-10-CM

## 2015-07-09 DIAGNOSIS — W19XXXS Unspecified fall, sequela: Secondary | ICD-10-CM

## 2015-07-09 DIAGNOSIS — R2689 Other abnormalities of gait and mobility: Secondary | ICD-10-CM

## 2015-07-09 DIAGNOSIS — M623 Immobility syndrome (paraplegic): Secondary | ICD-10-CM | POA: Diagnosis not present

## 2015-07-09 DIAGNOSIS — R29818 Other symptoms and signs involving the nervous system: Secondary | ICD-10-CM | POA: Diagnosis not present

## 2015-07-09 DIAGNOSIS — R269 Unspecified abnormalities of gait and mobility: Secondary | ICD-10-CM

## 2015-07-09 DIAGNOSIS — M2560 Stiffness of unspecified joint, not elsewhere classified: Secondary | ICD-10-CM

## 2015-07-09 DIAGNOSIS — Z7409 Other reduced mobility: Secondary | ICD-10-CM

## 2015-07-09 DIAGNOSIS — R2681 Unsteadiness on feet: Secondary | ICD-10-CM | POA: Diagnosis not present

## 2015-07-09 NOTE — Therapy (Signed)
[facility] [address] [place], [place], [address] Phone: [phone]   Fax:  [phone]  Physical Therapy Treatment  Patient Details  Name: [name] [name] MRN: [id number] Date of Birth: 12/16/1930 No Data Recorded  Encounter Date: 07/09/2015      PT End of Session - 07/09/15 1458    Visit Number 7   Number of Visits 17   Date for PT Re-Evaluation 08/10/15   Authorization Type G-code every 10th visit   PT Start Time 1450   PT Stop Time 1530   PT Time Calculation (min) 40 min   Activity Tolerance Patient tolerated treatment well   Behavior During Therapy [facility] for tasks assessed/performed      Past Medical History  Diagnosis Date  . PE (pulmonary embolism)     unprovoked PE completed 6 months of warfarin, warfarin d/ced 02/12/2010, repeat PE 07/10/2010 after a long car ride and now on lifelong coumadin.  [name] Hypertension   . Pituitary microadenoma ([place])     incidental finding CT 12/2009  . Prostate cancer ([place])   . Depression   . Hyperlipidemia   . Coronary artery disease, non-occlusive     with history of MI; Cath 2008 w multivessel nonobstructive CAD  . Dementia   . GERD (gastroesophageal reflux disease)   . Scoliosis   . Anxiety     Past Surgical History  Procedure Laterality Date  . Prostatectomy    . Left heart catheterization with coronary angiogram N/A 12/10/2014    Procedure: LEFT HEART CATHETERIZATION WITH CORONARY ANGIOGRAM;  Surgeon: [name], MD;  Location: [facility] C[facility] LAB;  Service: Cardiovascular;  Laterality: N/A;  . Loop recorder implant N/A 12/16/2014    Procedure: LOOP RECORDER IMPLANT;  Surgeon: [name], MD;  Location: [facility] CATH LAB;  Service: Cardiovascular;  Laterality: N/A;    There were no vitals filed for this visit.  Visit Diagnosis:  Unsteadiness  Abnormality of gait  Weakness generalized  Balance problems  Falls, sequela  Stiffness due to immobility       Subjective Assessment - 07/09/15 1456    Subjective No new complaints. Has new RW he is using on arrival. Pt and daughter report no new falls.    Currently in Pain? No/denies   Pain Score 0-No pain        07/09/15 0001  Transfers  Sit to Stand 4: Min guard;With upper extremity assist;From bed;Multiple attempts;4: Min assist  Sit to Stand Details Verbal cues for gait pattern;Verbal cues for safe use of DME/AE;Verbal cues for precautions/safety;Verbal cues for technique;Visual cues for safe use of DME/AE  Stand to Sit 4: Min guard;With upper extremity assist;To bed;Uncontrolled descent;4: Min assist  Stand to Sit Details (indicate cue type and reason) Visual cues for safe use of DME/AE;Verbal cues for safe use of DME/AE;Manual facilitation for weight shifting;Verbal cues for precautions/safety;Verbal cues for technique  Ambulation/Gait  Ambulation/Gait Yes  Ambulation/Gait Assistance 4: Min guard;4: Min assist  Ambulation/Gait Assistance Details pt needed cues on posture, increased step length, and walker/rollator position with gait. cues on brake use as well with rollator with sit<>stands, pt did demo carryover on hand position with gait using rollator from previous sessions.                                              Ambulation Distance (Feet) 350  Feet (x1 with walker; 100 feet neg furniture;;350 x1 with rollator)  Assistive device Rolling walker  Gait Pattern Step-through pattern;Decreased stride length;Shuffle  Ambulation Surface Level;Indoor  High Level Balance  High Level Balance Activities Negotitating around obstacles  High Level Balance Comments performed large figure 8's around 2 hula hoops on ground, cues on step length, walker position (especially with turning) and on object negotiation with walker (pt with tendency to hit things with walker).           PT Short Term Goals - 07/09/15 1458    PT SHORT TERM GOAL #1   Title Patient verbalizes fall prevention strategies.  (Target Date: 07/11/2015)   Status New   PT SHORT TERM GOAL #2   Title Patient demonstrates understanding of [place] exercise program. (Target Date: 07/11/2015)   Baseline met on 07/09/15   Status Achieved   PT SHORT TERM GOAL #3   Title Patient ambulates with rollator walker 150' with supervision to initiate safer community mobility. (Target Date: 07/11/2015)   Status New           PT Long Term Goals - 06/11/15 1100    PT LONG TERM GOAL #1   Title Patient & daughter demonstrate / verbalioize understanding of ongoing HEP / fitness plan. (Target Date: 08/08/2015)   Time 8   Period Weeks   Status New   PT LONG TERM GOAL #2   Title Five times sit to stand <15 sec (Target Date: 08/08/2015)   Time 8   Period Weeks   Status New   PT LONG TERM GOAL #3   Title Patient ambulates 250' with LRAD with SBA safely. (Target Date: 08/08/2015)   Time 8   Period Weeks   Status New   PT LONG TERM GOAL #4   Title Patient negotiates ramps, curbs and stairs with LRAD with supervision. (Target Date: 08/08/2015)   Time 8   Period Weeks   Status New   PT LONG TERM GOAL #5   Title Patient ambulates around furniture carrying cup of water with LRAD modified independent. (Target Date: 08/08/2015)   Time 8   Period Weeks   Status New           Plan - 07/09/15 1458    Clinical Impression Statement Pt needs cues for safety with use of walker and rollator. Improved balance and gait pattern with use of both. Will continue to work/train with both and assess safety with each one.   Pt will benefit from skilled therapeutic intervention in order to improve on the following deficits Abnormal gait;Decreased activity tolerance;Decreased balance;Decreased endurance;Decreased mobility;Decreased safety awareness;Decreased range of motion;Decreased strength;Impaired flexibility   Rehab Potential Good   PT Frequency 2x / week   PT Duration 8 weeks   PT Treatment/Interventions ADLs/Self Care Home Management;DME  Instruction;Gait training;Stair training;Functional mobility training;Therapeutic activities;Therapeutic exercise;Balance training;Neuromuscular re-education;Patient/family education   PT Next Visit Plan continue with gait traiing with walker/rollator., especially furniture/obstacle negotiations.   PT Home Exercise Plan 10/5: completed instruction of OTAGO   Consulted and Agree with Plan of Care Patient;Family member/caregiver   Family Member Consulted daughter, [name]        Problem List Patient Active Problem List   Diagnosis Date Noted  . Leukopenia 04/16/2015  . Normocytic anemia 04/16/2015  . Chronic systolic heart failure ([place]) 04/16/2015  . Falls frequently 04/16/2015  . Onychomycosis 04/15/2015  . CAD (coronary artery disease) 01/16/2015  . Faintness   . 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 Syncope 12/07/2014  .  Healthcare maintenance 10/10/2014  . Somnolence, daytime 10/01/2014  . Acute pain of right hip 02/13/2014  . Lumbosacral spondylosis without myelopathy 09/24/2013  . Symptomatic bradycardia 02/20/2013  . Generalized ischemic cerebrovascular disease 02/05/2013  . Depression 01/25/2013  . Penile bleeding 01/19/2013  . Constipation 01/20/2012  . Insomnia 01/20/2012  . Alzheimer's dementia 08/17/2011  . Pulmonary embolism ([place]) 09/18/2010  . Encounter for long-term use of antiplatelets/antithrombotics 09/18/2010  . DJD (degenerative joint disease) 08/06/2010  . ADENOCARCINOMA, PROSTATE, HX OF 08/06/2010  . HLD (hyperlipidemia) 10/15/2009  . Essential hypertension, benign 10/15/2009  . GERD 10/15/2009    Willow Ora 07/10/2015, 4:45 PM  Willow Ora, PTA, [facility] [address], [place], [place] [address] [phone] 07/10/2015, 4:46 PM   Name: [name] [name] MRN: [id number] Date of Birth: 1931/03/25

## 2015-07-10 ENCOUNTER — Other Ambulatory Visit: Payer: Self-pay

## 2015-07-10 DIAGNOSIS — I5023 Acute on chronic systolic (congestive) heart failure: Secondary | ICD-10-CM

## 2015-07-10 DIAGNOSIS — I10 Essential (primary) hypertension: Secondary | ICD-10-CM

## 2015-07-10 MED ORDER — POTASSIUM CHLORIDE ER 10 MEQ PO TBCR: 10.0000 meq | EXTENDED_RELEASE_TABLET | ORAL | Status: DC

## 2015-07-10 MED ORDER — WARFARIN SODIUM 5 MG PO TABS: ORAL_TABLET | ORAL | Status: DC

## 2015-07-11 ENCOUNTER — Other Ambulatory Visit: Payer: Self-pay | Admitting: Internal Medicine

## 2015-07-11 ENCOUNTER — Encounter (HOSPITAL_COMMUNITY): Payer: Self-pay

## 2015-07-11 ENCOUNTER — Emergency Department (HOSPITAL_COMMUNITY)
Admission: EM | Admit: 2015-07-11 | Discharge: 2015-07-11 | Disposition: A | Discharge status: Home / Self Care | Payer: Commercial Managed Care - HMO | Attending: Emergency Medicine | Admitting: Emergency Medicine

## 2015-07-11 ENCOUNTER — Emergency Department (HOSPITAL_COMMUNITY): Payer: Commercial Managed Care - HMO

## 2015-07-11 DIAGNOSIS — K219 Gastro-esophageal reflux disease without esophagitis: Secondary | ICD-10-CM | POA: Insufficient documentation

## 2015-07-11 DIAGNOSIS — E785 Hyperlipidemia, unspecified: Secondary | ICD-10-CM | POA: Insufficient documentation

## 2015-07-11 DIAGNOSIS — S0990XA Unspecified injury of head, initial encounter: Secondary | ICD-10-CM | POA: Diagnosis not present

## 2015-07-11 DIAGNOSIS — I10 Essential (primary) hypertension: Secondary | ICD-10-CM | POA: Diagnosis not present

## 2015-07-11 DIAGNOSIS — Z79899 Other long term (current) drug therapy: Secondary | ICD-10-CM | POA: Diagnosis not present

## 2015-07-11 DIAGNOSIS — Y9389 Activity, other specified: Secondary | ICD-10-CM | POA: Insufficient documentation

## 2015-07-11 DIAGNOSIS — Z9889 Other specified postprocedural states: Secondary | ICD-10-CM | POA: Diagnosis not present

## 2015-07-11 DIAGNOSIS — Z86711 Personal history of pulmonary embolism: Secondary | ICD-10-CM | POA: Insufficient documentation

## 2015-07-11 DIAGNOSIS — Z86018 Personal history of other benign neoplasm: Secondary | ICD-10-CM | POA: Insufficient documentation

## 2015-07-11 DIAGNOSIS — Y999 Unspecified external cause status: Secondary | ICD-10-CM | POA: Diagnosis not present

## 2015-07-11 DIAGNOSIS — W19XXXA Unspecified fall, initial encounter: Secondary | ICD-10-CM

## 2015-07-11 DIAGNOSIS — I2510 Atherosclerotic heart disease of native coronary artery without angina pectoris: Secondary | ICD-10-CM | POA: Insufficient documentation

## 2015-07-11 DIAGNOSIS — Z7901 Long term (current) use of anticoagulants: Secondary | ICD-10-CM | POA: Insufficient documentation

## 2015-07-11 DIAGNOSIS — S0080XA Unspecified superficial injury of other part of head, initial encounter: Secondary | ICD-10-CM | POA: Diagnosis not present

## 2015-07-11 DIAGNOSIS — Y92009 Unspecified place in unspecified non-institutional (private) residence as the place of occurrence of the external cause: Secondary | ICD-10-CM | POA: Insufficient documentation

## 2015-07-11 DIAGNOSIS — W1839XA Other fall on same level, initial encounter: Secondary | ICD-10-CM | POA: Diagnosis not present

## 2015-07-11 DIAGNOSIS — Z8546 Personal history of malignant neoplasm of prostate: Secondary | ICD-10-CM | POA: Insufficient documentation

## 2015-07-11 DIAGNOSIS — F0390 Unspecified dementia without behavioral disturbance: Secondary | ICD-10-CM | POA: Diagnosis not present

## 2015-07-11 DIAGNOSIS — Z7982 Long term (current) use of aspirin: Secondary | ICD-10-CM | POA: Insufficient documentation

## 2015-07-11 DIAGNOSIS — Z8659 Personal history of other mental and behavioral disorders: Secondary | ICD-10-CM | POA: Insufficient documentation

## 2015-07-11 DIAGNOSIS — Z8739 Personal history of other diseases of the musculoskeletal system and connective tissue: Secondary | ICD-10-CM | POA: Insufficient documentation

## 2015-07-11 LAB — BASIC METABOLIC PANEL
Anion gap: 7 (ref 5–15)
BUN: 13 mg/dL (ref 6–20)
CALCIUM: 9.2 mg/dL (ref 8.9–10.3)
CO2: 27 mmol/L (ref 22–32)
Chloride: 106 mmol/L (ref 101–111)
Creatinine, Ser: 1.08 mg/dL (ref 0.61–1.24)
GFR calc Af Amer: >60 mL/min (ref >60)
GFR calc non Af Amer: >60 mL/min (ref >60)
Glucose, Bld: 88 mg/dL (ref 65–99)
Potassium: 3.8 mmol/L (ref 3.5–5.1)
Sodium: 140 mmol/L (ref 135–145)

## 2015-07-11 LAB — CBC
HCT: 38.2 % — ABNORMAL LOW (ref 39.0–52.0)
HEMOGLOBIN: 12.1 g/dL — AB (ref 13.0–17.0)
MCH: 28.9 pg (ref 26.0–34.0)
MCHC: 31.7 g/dL (ref 30.0–36.0)
MCV: 91.4 fL (ref 78.0–100.0)
Platelets: 149 10*3/uL — ABNORMAL LOW (ref 150–400)
RBC: 4.18 MIL/uL — ABNORMAL LOW (ref 4.22–5.81)
RDW: 18.4 % — AB (ref 11.5–15.5)
WBC: 3.1 10*3/uL — ABNORMAL LOW (ref 4.0–10.5)

## 2015-07-11 LAB — I-STAT TROPONIN, ED: Troponin i, poc: 0.01 ng/mL (ref 0.00–0.08)

## 2015-07-11 LAB — PROTIME-INR
INR: 2.88 — ABNORMAL HIGH (ref 0.00–1.49)
PROTHROMBIN TIME: 29.7 s — AB (ref 11.6–15.2)

## 2015-07-11 NOTE — ED Provider Notes (Signed)
CSN: [id number]     Arrival date & time 07/11/15  1010 History   First MD Initiated Contact with Patient 07/11/15 1031     Chief Complaint  Patient presents with  . Fall     (Consider location/radiation/quality/duration/timing/severity/associated sxs/prior Treatment) HPI  Pt presenting after fall from standing.  He was bending over to put on his pants and fell backwards.  He lives at home with wife and daughter.  Daughter states he did strike his head.  He had no LOC.  No seizure activity.  No c/o pain.  No vomiting.  He is at his mental status baseline per daughter.  No chest pain or palpitations or dizziness.  There are no other associated systemic symptoms, there are no other alleviating or modifying factors.   Past Medical History  Diagnosis Date  . PE (pulmonary embolism)     unprovoked PE completed 6 months of warfarin, warfarin d/ced 02/12/2010, repeat PE 07/10/2010 after a long car ride and now on lifelong coumadin.  [name] Hypertension   . Pituitary microadenoma ([place])     incidental finding CT 12/2009  . Prostate cancer ([place])   . Depression   . Hyperlipidemia   . Coronary artery disease, non-occlusive     with history of MI; Cath 2008 w multivessel nonobstructive CAD  . Dementia   . GERD (gastroesophageal reflux disease)   . Scoliosis   . Anxiety    Past Surgical History  Procedure Laterality Date  . Prostatectomy    . Left heart catheterization with coronary angiogram N/A 12/10/2014    Procedure: LEFT HEART CATHETERIZATION WITH CORONARY ANGIOGRAM;  Surgeon: [name], MD;  Location: [facility] CATH LAB;  Service: Cardiovascular;  Laterality: N/A;  . Loop recorder implant N/A 12/16/2014    Procedure: LOOP RECORDER IMPLANT;  Surgeon: [name], MD;  Location: [facility] CATH LAB;  Service: Cardiovascular;  Laterality: N/A;   Family History  Problem Relation Age of Onset  . Hypertension Father     Passed away from cerebral hemorrhage at age of 60.  [name] Dementia Mother     Passed  away  . Hypertension Child     4 adult children  . Cervical cancer Other   . Lung cancer Other   . Stroke Other   . Heart attack Neg Hx    Social History  Substance Use Topics  . Smoking status: Never Smoker   . Smokeless tobacco: Never Used  . Alcohol Use: No    Review of Systems  ROS reviewed and all otherwise negative except for mentioned in HPI    Allergies  Aricept and Other  Home Medications   Prior to Admission medications   Medication Sig Start Date End Date Taking? Authorizing Provider  acetaminophen (TYLENOL) 500 MG tablet Take 500 mg by mouth every 4 (four) hours as needed for mild pain or headache.   Yes Historical Provider, MD  amLODipine (NORVASC) 2.5 MG tablet TAKE 1 TABLET (2.5 MG TOTAL) BY MOUTH DAILY. 07/08/15  Yes [name], MD  aspirin EC 81 MG EC tablet Take 1 tablet (81 mg total) by mouth daily. 12/17/14  Yes Isaiah Serge, NP  atorvastatin (LIPITOR) 40 MG tablet TAKE 1 TABLET (40 MG TOTAL) BY MOUTH EVERY EVENING. 11/26/14  Yes Francesca Oman, DO  b complex vitamins tablet Take 1 tablet by mouth daily.   Yes Historical Provider, MD  Calcium Carbonate-Vitamin D (CALCIUM-VITAMIN D) 500-200 MG-UNIT per tablet Take 1 tablet by mouth daily.  Yes Historical Provider, MD  carbidopa-levodopa (SINEMET IR) 25-100 MG per tablet Take 1 tablet by mouth 2 (two) times daily. 12/17/14  Yes Isaiah Serge, NP  fish oil-omega-3 fatty acids 1000 MG capsule Take 2 g by mouth daily.    Yes Historical Provider, MD  furosemide (LASIX) 20 MG tablet Take 1 tablet (20 mg total) by mouth daily. 04/14/15  Yes Liliane Shi, PA-C  Multiple Vitamins-Minerals (MULTIVITAMIN WITH MINERALS) tablet Take 1 tablet by mouth daily.   Yes Historical Provider, MD  NAMENDA XR 28 MG CP24 24 hr capsule TAKE 1 CAPSULE BY MOUTH DAILY 03/11/15  Yes Garvin Fila, MD  omeprazole (PRILOSEC) 20 MG capsule TAKE 1 CAPSULE (20 MG TOTAL) BY MOUTH DAILY. 05/05/15  Yes Francesca Oman, DO  potassium chloride  (K-DUR) 10 MEQ tablet Take 1 tablet (10 mEq total) by mouth as directed. 1st dose take 2 tabs = 20 meq then decrease to 1 tab = 10 meq daily 07/10/15  Yes [name], MD  vitamin C (ASCORBIC ACID) 500 MG tablet Take 500 mg by mouth daily.   Yes Historical Provider, MD  warfarin (COUMADIN) 5 MG tablet Take as directed. Patient taking differently: Take 5-7.5 mg by mouth daily. 7.5mg  on Monday and 5 mg all other days 07/10/15  Yes Alex Ronnie Derby, DO   BP 176/70 mmHg  Pulse 38  Temp(Src) 97.7 F (36.5 C)  Resp 16  SpO2 97%  Vitals reviewed Physical Exam  Physical Examination: General appearance - alert, well appearing, and in no distress Mental status - alert, oriented to person, place, and time Head- NCAT Eyes -no conjunctival injection, no scleral icterus Mouth - mucous membranes moist, pharynx normal without lesions Chest - clear to auscultation, no wheezes, rales or rhonchi, symmetric air entry Heart - normal rate, regular rhythm, normal S1, S2, no murmurs, rubs, clicks or gallops Abdomen - soft, nontender, nondistended, no masses or organomegaly Neurological - alert, oriented, normal speech, strength 5/5 in extremities x 4, following commands, sensation intact Extremities - peripheral pulses normal, no pedal edema, no clubbing or cyanosis Skin - normal coloration and turgor, no rashes  ED Course  Procedures (including critical care time) Labs Review Labs Reviewed  CBC - Abnormal; Notable for the following:    WBC 3.1 (*)    RBC 4.18 (*)    Hemoglobin 12.1 (*)    HCT 38.2 (*)    RDW 18.4 (*)    Platelets 149 (*)    All other components within normal limits  PROTIME-INR - Abnormal; Notable for the following:    Prothrombin Time 29.7 (*)    INR 2.88 (*)    All other components within normal limits  BASIC METABOLIC PANEL  I-STAT TROPOININ, ED    Imaging Review Ct Head Wo Contrast  07/11/2015  CLINICAL DATA:  [place] backwards while trying to put on hands with blunt trauma  to the head, initial encounter EXAM: CT HEAD WITHOUT CONTRAST TECHNIQUE: Contiguous axial images were obtained from the base of the skull through the vertex without intravenous contrast. COMPARISON:  04/28/2015 FINDINGS: The bony calvarium is intact. No gross soft tissue abnormality is noted. Diffuse atrophic changes and mild chronic white matter ischemic change are again identified. No findings to suggest acute hemorrhage, acute infarction or space-occupying mass lesion are noted. IMPRESSION: Chronic atrophic and ischemic changes stable from the prior exam. No acute abnormality noted. Electronically Signed   By: Inez Catalina M.D.   On: 07/11/2015 12:11  I have personally reviewed and evaluated these images and lab results as part of my medical decision-making.   EKG Interpretation   Date/Time:  Friday July 11 2015 10:25:41 EDT Ventricular Rate:  64 PR Interval:  201 QRS Duration: 158 QT Interval:  441 QTC Calculation: 455 R Axis:   48 Text Interpretation:  Sinus rhythm Ventricular premature complex Right  bundle branch block Baseline wander in lead(s) V2 Since previous tracing  pvc are new Confirmed by Canary Brim  MD, Toshia Larkin [phone]) on 07/11/2015 1:13:51  PM      MDM   Final diagnoses:  Head injury, initial encounter  Fall, initial encounter    Pt presenting after fall from standing.  He did strike his head- no LOC.  Pt takes plavix.  He is at his baseline per family at bedside.   INR is reassuring, head CT is also reassuring.  EKG wtihout significant changes as well.  Discharged with strict return precautions.  Pt agreeable with plan.    Alfonzo Beers, MD 07/11/15 1455

## 2015-07-11 NOTE — Discharge Instructions (Signed)
Return to the ED with any concerns including vomiting, seizures, decreased level of alertness/lethargy, or any other alarming symptoms

## 2015-07-11 NOTE — ED Notes (Signed)
Pt daughter verbalized understanding of d/c instructions and follow-up care. No further questions/concerns, VSS, assisted to lobby in wheelchair.

## 2015-07-11 NOTE — ED Notes (Signed)
Pt fell at home trying to put on pants. Pt fell backward and hit head. Knot/minor swelling above left eye. Pt is on warfarin. Denies LOC

## 2015-07-14 ENCOUNTER — Encounter: Payer: Commercial Managed Care - HMO | Admitting: Physical Therapy

## 2015-07-14 ENCOUNTER — Ambulatory Visit (INDEPENDENT_AMBULATORY_CARE_PROVIDER_SITE_OTHER): Payer: Commercial Managed Care - HMO | Admitting: *Deleted

## 2015-07-14 DIAGNOSIS — R55 Syncope and collapse: Secondary | ICD-10-CM | POA: Diagnosis not present

## 2015-07-15 ENCOUNTER — Other Ambulatory Visit: Payer: Self-pay | Admitting: Neurology

## 2015-07-15 NOTE — Progress Notes (Signed)
Loop recorder 

## 2015-07-16 ENCOUNTER — Ambulatory Visit: Payer: Commercial Managed Care - HMO | Admitting: Physical Therapy

## 2015-07-16 ENCOUNTER — Emergency Department (HOSPITAL_COMMUNITY): Payer: Commercial Managed Care - HMO

## 2015-07-16 ENCOUNTER — Encounter (HOSPITAL_COMMUNITY): Payer: Self-pay | Admitting: Emergency Medicine

## 2015-07-16 ENCOUNTER — Emergency Department (HOSPITAL_COMMUNITY)
Admission: EM | Admit: 2015-07-16 | Discharge: 2015-07-16 | Disposition: A | Discharge status: Home / Self Care | Payer: Commercial Managed Care - HMO | Attending: Emergency Medicine | Admitting: Emergency Medicine

## 2015-07-16 DIAGNOSIS — Z8546 Personal history of malignant neoplasm of prostate: Secondary | ICD-10-CM | POA: Diagnosis not present

## 2015-07-16 DIAGNOSIS — I2510 Atherosclerotic heart disease of native coronary artery without angina pectoris: Secondary | ICD-10-CM | POA: Insufficient documentation

## 2015-07-16 DIAGNOSIS — Y92009 Unspecified place in unspecified non-institutional (private) residence as the place of occurrence of the external cause: Secondary | ICD-10-CM | POA: Insufficient documentation

## 2015-07-16 DIAGNOSIS — Y9301 Activity, walking, marching and hiking: Secondary | ICD-10-CM | POA: Insufficient documentation

## 2015-07-16 DIAGNOSIS — W1839XA Other fall on same level, initial encounter: Secondary | ICD-10-CM | POA: Insufficient documentation

## 2015-07-16 DIAGNOSIS — D72819 Decreased white blood cell count, unspecified: Secondary | ICD-10-CM | POA: Diagnosis not present

## 2015-07-16 DIAGNOSIS — D649 Anemia, unspecified: Secondary | ICD-10-CM

## 2015-07-16 DIAGNOSIS — M419 Scoliosis, unspecified: Secondary | ICD-10-CM | POA: Diagnosis not present

## 2015-07-16 DIAGNOSIS — Z86711 Personal history of pulmonary embolism: Secondary | ICD-10-CM | POA: Diagnosis not present

## 2015-07-16 DIAGNOSIS — E785 Hyperlipidemia, unspecified: Secondary | ICD-10-CM | POA: Insufficient documentation

## 2015-07-16 DIAGNOSIS — S0990XA Unspecified injury of head, initial encounter: Secondary | ICD-10-CM | POA: Diagnosis not present

## 2015-07-16 DIAGNOSIS — Z79899 Other long term (current) drug therapy: Secondary | ICD-10-CM | POA: Insufficient documentation

## 2015-07-16 DIAGNOSIS — G309 Alzheimer's disease, unspecified: Secondary | ICD-10-CM | POA: Insufficient documentation

## 2015-07-16 DIAGNOSIS — Y998 Other external cause status: Secondary | ICD-10-CM | POA: Diagnosis not present

## 2015-07-16 DIAGNOSIS — Z8659 Personal history of other mental and behavioral disorders: Secondary | ICD-10-CM | POA: Insufficient documentation

## 2015-07-16 DIAGNOSIS — W19XXXA Unspecified fall, initial encounter: Secondary | ICD-10-CM

## 2015-07-16 DIAGNOSIS — F0280 Dementia in other diseases classified elsewhere without behavioral disturbance: Secondary | ICD-10-CM | POA: Diagnosis not present

## 2015-07-16 DIAGNOSIS — I10 Essential (primary) hypertension: Secondary | ICD-10-CM | POA: Insufficient documentation

## 2015-07-16 DIAGNOSIS — S0083XA Contusion of other part of head, initial encounter: Secondary | ICD-10-CM | POA: Diagnosis not present

## 2015-07-16 DIAGNOSIS — T148 Other injury of unspecified body region: Secondary | ICD-10-CM | POA: Diagnosis not present

## 2015-07-16 DIAGNOSIS — S0993XA Unspecified injury of face, initial encounter: Secondary | ICD-10-CM | POA: Insufficient documentation

## 2015-07-16 DIAGNOSIS — Z7982 Long term (current) use of aspirin: Secondary | ICD-10-CM | POA: Diagnosis not present

## 2015-07-16 DIAGNOSIS — R51 Headache: Secondary | ICD-10-CM | POA: Diagnosis not present

## 2015-07-16 DIAGNOSIS — S199XXA Unspecified injury of neck, initial encounter: Secondary | ICD-10-CM | POA: Diagnosis not present

## 2015-07-16 LAB — CBC WITH DIFFERENTIAL/PLATELET
BASOS ABS: 0 10*3/uL (ref 0.0–0.1)
BASOS PCT: 1 %
EOS ABS: 0.1 10*3/uL (ref 0.0–0.7)
EOS PCT: 3 %
HCT: 38.3 % — ABNORMAL LOW (ref 39.0–52.0)
Hemoglobin: 12.3 g/dL — ABNORMAL LOW (ref 13.0–17.0)
Lymphocytes Relative: 37 %
Lymphs Abs: 1.2 10*3/uL (ref 0.7–4.0)
MCH: 29.6 pg (ref 26.0–34.0)
MCHC: 32.1 g/dL (ref 30.0–36.0)
MCV: 92.1 fL (ref 78.0–100.0)
Monocytes Absolute: 0.3 10*3/uL (ref 0.1–1.0)
Monocytes Relative: 8 %
Neutro Abs: 1.7 10*3/uL (ref 1.7–7.7)
Neutrophils Relative %: 51 %
PLATELETS: 154 10*3/uL (ref 150–400)
RBC: 4.16 MIL/uL — AB (ref 4.22–5.81)
RDW: 18.2 % — ABNORMAL HIGH (ref 11.5–15.5)
WBC: 3.3 10*3/uL — AB (ref 4.0–10.5)

## 2015-07-16 LAB — I-STAT TROPONIN, ED
TROPONIN I, POC: 0.02 ng/mL (ref 0.00–0.08)
Troponin i, poc: 0.03 ng/mL (ref 0.00–0.08)

## 2015-07-16 LAB — COMPREHENSIVE METABOLIC PANEL
ALK PHOS: 47 U/L (ref 38–126)
ALT: 37 U/L (ref 17–63)
AST: 52 U/L — AB (ref 15–41)
Albumin: 3.1 g/dL — ABNORMAL LOW (ref 3.5–5.0)
Anion gap: 7 (ref 5–15)
BUN: 11 mg/dL (ref 6–20)
CALCIUM: 8.9 mg/dL (ref 8.9–10.3)
CHLORIDE: 106 mmol/L (ref 101–111)
CO2: 28 mmol/L (ref 22–32)
CREATININE: 1.09 mg/dL (ref 0.61–1.24)
GFR calc Af Amer: >60 mL/min (ref >60)
GFR calc non Af Amer: >60 mL/min (ref >60)
Glucose, Bld: 93 mg/dL (ref 65–99)
Potassium: 3.8 mmol/L (ref 3.5–5.1)
SODIUM: 141 mmol/L (ref 135–145)
Total Bilirubin: 0.7 mg/dL (ref 0.3–1.2)
Total Protein: 6.4 g/dL — ABNORMAL LOW (ref 6.5–8.1)

## 2015-07-16 LAB — PROTIME-INR
INR: 2.69 — ABNORMAL HIGH (ref 0.00–1.49)
Prothrombin Time: 28.2 seconds — ABNORMAL HIGH (ref 11.6–15.2)

## 2015-07-16 NOTE — ED Notes (Signed)
Pt to ED via [facility] due to fall about 1 hour prior to arrival. Pain to left side of head. No obvious injury. Pt is on warfarin. Per pt daughter, pt is at his baseline. A/Ox2.

## 2015-07-16 NOTE — ED Provider Notes (Signed)
CSN: [id number]     Arrival date & time 07/16/15  8466 History   First MD Initiated Contact with Patient 07/16/15 [phone]     Chief Complaint  Patient presents with  . Fall     (Consider location/radiation/quality/duration/timing/severity/associated sxs/prior Treatment) HPI   [name] [name] is a 81 y.o. male, pt with history of PE, CAD/MI, Prostate CA, Dementia, warfarin use, and Falls, presents with a fall from standing about an hour ago. Pt states he was walking in his home, he got dizzy, and then "maybe passed out for a few seconds." Pt presents via EMS who told RN that pt daughter confirms pt is at his mental status baseline. Pt c/o pain to the left side of his face and head, rated 6 or 7/10, achy, and non-radiating. Nothing makes the pain better, but palpation makes it worse. Denies N/V, chest pain, shortness of breath, fever/chills, current dizziness, recent illness or any other pain or complaints. Pt denies urinary symptoms and abdominal pain. Pt states he lives alone. Pt states, "I live with my mother. I have to take care of her because she has alzheimers. She's in her late 64s."    Past Medical History  Diagnosis Date  . PE (pulmonary embolism)     unprovoked PE completed 6 months of warfarin, warfarin d/ced 02/12/2010, repeat PE 07/10/2010 after a long car ride and now on lifelong coumadin.  [name] Hypertension   . Pituitary microadenoma ([place])     incidental finding CT 12/2009  . Prostate cancer ([place])   . Depression   . Hyperlipidemia   . Coronary artery disease, non-occlusive     with history of MI; Cath 2008 w multivessel nonobstructive CAD  . Dementia   . GERD (gastroesophageal reflux disease)   . Scoliosis   . Anxiety    Past Surgical History  Procedure Laterality Date  . Prostatectomy    . Left heart catheterization with coronary angiogram N/A 12/10/2014    Procedure: LEFT HEART CATHETERIZATION WITH CORONARY ANGIOGRAM;  Surgeon: [name], MD;  Location: [facility] CATH LAB;   Service: Cardiovascular;  Laterality: N/A;  . Loop recorder implant N/A 12/16/2014    Procedure: LOOP RECORDER IMPLANT;  Surgeon: [name], MD;  Location: [facility] CATH LAB;  Service: Cardiovascular;  Laterality: N/A;   Family History  Problem Relation Age of Onset  . Hypertension Father     Passed away from cerebral hemorrhage at age of 14.  [name] Dementia Mother     Passed away  . Hypertension Child     4 adult children  . Cervical cancer Other   . Lung cancer Other   . Stroke Other   . Heart attack Neg Hx    Social History  Substance Use Topics  . Smoking status: Never Smoker   . Smokeless tobacco: Never Used  . Alcohol Use: No    Review of Systems  Constitutional: Negative for fever, chills, diaphoresis and unexpected weight change.  HENT:       Pain to left side of face and temple.   Respiratory: Negative for cough, chest tightness and shortness of breath.   Cardiovascular: Negative for palpitations and leg swelling.  Gastrointestinal: Negative for nausea, vomiting, abdominal pain, diarrhea and constipation.  Genitourinary: Negative for dysuria, hematuria, flank pain and penile pain.  Musculoskeletal: Negative for back pain, arthralgias, neck pain and neck stiffness.  Skin: Negative for color change and pallor.  Neurological: Positive for dizziness and headaches. Negative for tremors, syncope, speech difficulty, weakness,  light-headedness and numbness.  All other systems reviewed and are negative.     Allergies  Aricept and Other  Home Medications   Prior to Admission medications   Medication Sig Start Date End Date Taking? Authorizing Provider  acetaminophen (TYLENOL) 500 MG tablet Take 500 mg by mouth every 4 (four) hours as needed for mild pain or headache.   Yes Historical Provider, MD  amLODipine (NORVASC) 2.5 MG tablet TAKE 1 TABLET (2.5 MG TOTAL) BY MOUTH DAILY. 07/08/15  Yes [name], MD  aspirin EC 81 MG EC tablet Take 1 tablet (81 mg total) by mouth  daily. 12/17/14  Yes Isaiah Serge, NP  atorvastatin (LIPITOR) 40 MG tablet TAKE 1 TABLET (40 MG TOTAL) BY MOUTH EVERY EVENING. 07/14/15  Yes Francesca Oman, DO  b complex vitamins tablet Take 1 tablet by mouth daily.   Yes Historical Provider, MD  Calcium Carbonate-Vitamin D (CALCIUM-VITAMIN D) 500-200 MG-UNIT per tablet Take 1 tablet by mouth daily.   Yes Historical Provider, MD  carbidopa-levodopa (SINEMET IR) 25-100 MG per tablet Take 1 tablet by mouth 2 (two) times daily. 12/17/14  Yes Isaiah Serge, NP  fish oil-omega-3 fatty acids 1000 MG capsule Take 2 g by mouth daily.    Yes Historical Provider, MD  furosemide (LASIX) 20 MG tablet Take 1 tablet (20 mg total) by mouth daily. 04/14/15  Yes Liliane Shi, PA-C  Multiple Vitamins-Minerals (MULTIVITAMIN WITH MINERALS) tablet Take 1 tablet by mouth daily.   Yes Historical Provider, MD  NAMENDA XR 28 MG CP24 24 hr capsule TAKE 1 CAPSULE BY MOUTH DAILY 07/15/15  Yes Garvin Fila, MD  omeprazole (PRILOSEC) 20 MG capsule TAKE 1 CAPSULE (20 MG TOTAL) BY MOUTH DAILY. 05/05/15  Yes Francesca Oman, DO  potassium chloride (K-DUR) 10 MEQ tablet Take 1 tablet (10 mEq total) by mouth as directed. 1st dose take 2 tabs = 20 meq then decrease to 1 tab = 10 meq daily 07/10/15  Yes [name], MD  vitamin C (ASCORBIC ACID) 500 MG tablet Take 500 mg by mouth daily.   Yes Historical Provider, MD  warfarin (COUMADIN) 5 MG tablet Take as directed. Patient taking differently: Take 5-7.5 mg by mouth daily. 7.5mg  on Monday and 5 mg all other days 07/10/15  Yes Francesca Oman, DO   BP 157/73 mmHg  Pulse 67  Temp(Src) 98.3 F (36.8 C) (Oral)  Resp 16  SpO2 98% Physical Exam  Constitutional: He appears well-developed and well-nourished. No distress.  HENT:  Head: Normocephalic and atraumatic.  Mild tenderness to left cheek bone and left temple. No instability, bruising, swelling, or crepitus.   Eyes: Conjunctivae and EOM are normal. Pupils are equal, round, and  reactive to light.  Neck: Neck supple.  C-spine without tenderness or step off. ROM deferred until imaging returned.   Cardiovascular: Normal rate, regular rhythm and normal heart sounds.   Pulmonary/Chest: Effort normal and breath sounds normal. No respiratory distress.  Abdominal: Soft. Bowel sounds are normal.  Musculoskeletal: Normal range of motion. He exhibits no edema or tenderness.  Normal ROM in all extremities. Trauma exam reveals no pain, tenderness, or instability. Chest and ribs stable without tenderness or crepitus. Pelvis stable without tenderness, pain, or crepitus. T-spine and L-spine without pain, tenderness or step off.   Lymphadenopathy:    He has no cervical adenopathy.  Neurological: He is alert. He has normal reflexes.  Pt is alert and oriented to person and situation, which  is consistent with his baseline per daughter. Readily follows commands. Coordination intact. Gait testing deferred. Strength 5/5 in all extremities. Cranial nerves II-XII grossly intact. No sensory deficits.   Skin: Skin is warm and dry. He is not diaphoretic.  Nursing note and vitals reviewed.   ED Course  Procedures (including critical care time) Labs Review Labs Reviewed  CBC WITH DIFFERENTIAL/PLATELET - Abnormal; Notable for the following:    WBC 3.3 (*)    RBC 4.16 (*)    Hemoglobin 12.3 (*)    HCT 38.3 (*)    RDW 18.2 (*)    All other components within normal limits  COMPREHENSIVE METABOLIC PANEL - Abnormal; Notable for the following:    Total Protein 6.4 (*)    Albumin 3.1 (*)    AST 52 (*)    All other components within normal limits  PROTIME-INR - Abnormal; Notable for the following:    Prothrombin Time 28.2 (*)    INR 2.69 (*)    All other components within normal limits  I-STAT TROPOININ, ED  I-STAT TROPOININ, ED  I-STAT TROPOININ, ED    Imaging Review Ct Head Wo Contrast  07/16/2015  CLINICAL DATA:  81 year old male who fell at 0700 hours. Left side head pain. On  Coumadin. Initial encounter. EXAM: CT HEAD WITHOUT CONTRAST CT MAXILLOFACIAL WITHOUT CONTRAST CT CERVICAL SPINE WITHOUT CONTRAST TECHNIQUE: Multidetector CT imaging of the head, cervical spine, and maxillofacial structures were performed using the standard protocol without intravenous contrast. Multiplanar CT image reconstructions of the cervical spine and maxillofacial structures were also generated. COMPARISON:  Head CT without contrast 07/11/2015 and earlier. CTA neck 01/15/2010. Brain MRI 04/09/2012. FINDINGS: CT HEAD FINDINGS No scalp hematoma identified. Stable orbits soft tissues. Tympanic cavities and mastoids are clear. Calvarium intact. Calcified atherosclerosis at the skull base. No midline shift, mass effect, or evidence of intracranial mass lesion. No ventriculomegaly. No acute intracranial hemorrhage identified. Scattered patchy and confluent cerebral white matter hypodensity is stable. Chronic lacunar infarct right thalamus. No cortically based acute infarct identified. Chronically increased soft tissue in the sella turcica appears stable since 2013. CT MAXILLOFACIAL FINDINGS Mandible intact. Paranasal sinuses are clear. Orbital walls and maxilla intact. No acute facial fracture. Stable orbits soft tissues. Negative visualized noncontrast pharynx, parapharyngeal spaces, retropharyngeal space, sublingual space, submandibular glands and parotid glands. CT CERVICAL SPINE FINDINGS Visualized skull base is intact. No atlanto-occipital dissociation. There is left side C2-C3 and bilateral C3-C4 posterior element ankylosis. There is some C3-C4 interbody ankylosis. There is mild anterolisthesis at C5-C6 and C7-T1 associated with facet degeneration at each level. Advanced disc and endplate degeneration at C6-C7. Mild upper thoracic anterolisthesis also noted. No cervical spine fracture identified. Grossly intact visualized upper thoracic levels. Negative lung apices.  Negative noncontrast paraspinal soft  tissues. IMPRESSION: 1. No acute intracranial abnormality. No acute facial fracture. No acute traumatic injury identified. 2. No acute fracture identified in the cervical spine. Multilevel degenerative spondylolisthesis. Ligamentous injury is not excluded. Electronically Signed   By: Genevie Ann M.D.   On: 07/16/2015 09:32   Ct Cervical Spine Wo Contrast  07/16/2015  CLINICAL DATA:  81 year old male who fell at 0700 hours. Left side head pain. On Coumadin. Initial encounter. EXAM: CT HEAD WITHOUT CONTRAST CT MAXILLOFACIAL WITHOUT CONTRAST CT CERVICAL SPINE WITHOUT CONTRAST TECHNIQUE: Multidetector CT imaging of the head, cervical spine, and maxillofacial structures were performed using the standard protocol without intravenous contrast. Multiplanar CT image reconstructions of the cervical spine and maxillofacial structures were also generated. COMPARISON:  Head CT without contrast 07/11/2015 and earlier. CTA neck 01/15/2010. Brain MRI 04/09/2012. FINDINGS: CT HEAD FINDINGS No scalp hematoma identified. Stable orbits soft tissues. Tympanic cavities and mastoids are clear. Calvarium intact. Calcified atherosclerosis at the skull base. No midline shift, mass effect, or evidence of intracranial mass lesion. No ventriculomegaly. No acute intracranial hemorrhage identified. Scattered patchy and confluent cerebral white matter hypodensity is stable. Chronic lacunar infarct right thalamus. No cortically based acute infarct identified. Chronically increased soft tissue in the sella turcica appears stable since 2013. CT MAXILLOFACIAL FINDINGS Mandible intact. Paranasal sinuses are clear. Orbital walls and maxilla intact. No acute facial fracture. Stable orbits soft tissues. Negative visualized noncontrast pharynx, parapharyngeal spaces, retropharyngeal space, sublingual space, submandibular glands and parotid glands. CT CERVICAL SPINE FINDINGS Visualized skull base is intact. No atlanto-occipital dissociation. There is left  side C2-C3 and bilateral C3-C4 posterior element ankylosis. There is some C3-C4 interbody ankylosis. There is mild anterolisthesis at C5-C6 and C7-T1 associated with facet degeneration at each level. Advanced disc and endplate degeneration at C6-C7. Mild upper thoracic anterolisthesis also noted. No cervical spine fracture identified. Grossly intact visualized upper thoracic levels. Negative lung apices.  Negative noncontrast paraspinal soft tissues. IMPRESSION: 1. No acute intracranial abnormality. No acute facial fracture. No acute traumatic injury identified. 2. No acute fracture identified in the cervical spine. Multilevel degenerative spondylolisthesis. Ligamentous injury is not excluded. Electronically Signed   By: Genevie Ann M.D.   On: 07/16/2015 09:32   Ct Maxillofacial Wo Cm  07/16/2015  CLINICAL DATA:  81 year old male who fell at 0700 hours. Left side head pain. On Coumadin. Initial encounter. EXAM: CT HEAD WITHOUT CONTRAST CT MAXILLOFACIAL WITHOUT CONTRAST CT CERVICAL SPINE WITHOUT CONTRAST TECHNIQUE: Multidetector CT imaging of the head, cervical spine, and maxillofacial structures were performed using the standard protocol without intravenous contrast. Multiplanar CT image reconstructions of the cervical spine and maxillofacial structures were also generated. COMPARISON:  Head CT without contrast 07/11/2015 and earlier. CTA neck 01/15/2010. Brain MRI 04/09/2012. FINDINGS: CT HEAD FINDINGS No scalp hematoma identified. Stable orbits soft tissues. Tympanic cavities and mastoids are clear. Calvarium intact. Calcified atherosclerosis at the skull base. No midline shift, mass effect, or evidence of intracranial mass lesion. No ventriculomegaly. No acute intracranial hemorrhage identified. Scattered patchy and confluent cerebral white matter hypodensity is stable. Chronic lacunar infarct right thalamus. No cortically based acute infarct identified. Chronically increased soft tissue in the sella turcica  appears stable since 2013. CT MAXILLOFACIAL FINDINGS Mandible intact. Paranasal sinuses are clear. Orbital walls and maxilla intact. No acute facial fracture. Stable orbits soft tissues. Negative visualized noncontrast pharynx, parapharyngeal spaces, retropharyngeal space, sublingual space, submandibular glands and parotid glands. CT CERVICAL SPINE FINDINGS Visualized skull base is intact. No atlanto-occipital dissociation. There is left side C2-C3 and bilateral C3-C4 posterior element ankylosis. There is some C3-C4 interbody ankylosis. There is mild anterolisthesis at C5-C6 and C7-T1 associated with facet degeneration at each level. Advanced disc and endplate degeneration at C6-C7. Mild upper thoracic anterolisthesis also noted. No cervical spine fracture identified. Grossly intact visualized upper thoracic levels. Negative lung apices.  Negative noncontrast paraspinal soft tissues. IMPRESSION: 1. No acute intracranial abnormality. No acute facial fracture. No acute traumatic injury identified. 2. No acute fracture identified in the cervical spine. Multilevel degenerative spondylolisthesis. Ligamentous injury is not excluded. Electronically Signed   By: Genevie Ann M.D.   On: 07/16/2015 09:32   I have personally reviewed and evaluated these images and lab results as part of my medical decision-making.  EKG Interpretation   Date/Time:  Wednesday July 16 2015 08:06:36 EDT Ventricular Rate:  84 PR Interval:  201 QRS Duration: 155 QT Interval:  437 QTC Calculation: 517 R Axis:   19 Text Interpretation:  Sinus rhythm Ventricular bigeminy Right bundle  branch block compared to prior, now has bigeminy pattern Confirmed by  [facility]  MD, TREY ([id number]) on 07/16/2015 8:34:58 AM      MDM   Final diagnoses:  Leukopenia  Normocytic anemia  Fall, initial encounter    Jocob Dambach presents with a fall from standing with questionable LOC.   Findings and plan of care discussed with Serita Grit,  MD.  Due to pt history and coumadin use, appropriate labs and imaging obtained. Pt appears to be at baseline mental status per EMS report. EKG shows sinus rhythm with RBBB and ventricular bigeminy. Bigeminy pattern new. INR consistent with coumadin target therapeutic level. CBC results reveal WBC count of 3.3 and Hgb of 12.3, both are consistent with multiple previous CBC results. CT scans show no evidence of bleeds or fractures.  9:42 AM Pt reevaluated and found to be at baseline mental status with no changes in exam. Will obtain serial troponins and EKGs and if no changes, discharge pt to home.  Repeat troponin negative. Pt to be discharged with daughter.   Lorayne Bender, PA-C 07/16/15 [place], MD 07/17/15 [phone]

## 2015-07-16 NOTE — Discharge Instructions (Signed)
You have been seen today for a fall. Your imaging and lab tests showed no abnormalities. Follow up with PCP as needed. Return to ED should symptoms worsen.   Emergency Department Resource Guide 1) Find a Doctor and Pay Out of Pocket Although you won't have to find out who is covered by your insurance plan, it is a good idea to ask around and get recommendations. You will then need to call the office and see if the doctor you have chosen will accept you as a new patient and what types of options they offer for patients who are self-pay. Some doctors offer discounts or will set up payment plans for their patients who do not have insurance, but you will need to ask so you aren't surprised when you get to your appointment.  2) Contact Your Local Health Department Not all health departments have doctors that can see patients for sick visits, but many do, so it is worth a call to see if yours does. If you don't know where your local health department is, you can check in your phone book. The CDC also has a tool to help you locate your state's health department, and many state websites also have listings of all of their local health departments.  3) Find a [facility] If your illness is not likely to be very severe or complicated, you may want to try a walk in clinic. These are popping up all over the country in pharmacies, drugstores, and shopping centers. They're usually staffed by nurse practitioners or physician assistants that have been trained to treat common illnesses and complaints. They're usually fairly quick and inexpensive. However, if you have serious medical issues or chronic medical problems, these are probably not your best option.  No Primary Care Doctor: - Call Health Connect at  [phone] - they can help you locate a primary care doctor that  accepts your insurance, provides certain services, etc. - Physician Referral Service- [phone]  Chronic Pain Problems: Organization          Address  Phone   Notes  [facility]  [phone] Patients need to be referred by their primary care doctor.   Medication Assistance: Organization         Address  Phone   Notes  [facility] Medication [facility] [address] [place], [place] [address] [phone] --Must be a resident of [facility] -- Must have NO insurance coverage whatsoever (no Medicaid/ Medicare, etc.) -- The pt. MUST have a primary care doctor that directs their care regularly and follows them in the community   MedAssist  [phone]   [employer]  [phone]    Agencies that provide inexpensive medical care: Organization         Address  Phone   Notes  [place]  [phone]   [facility]    [phone]   [facility] [place], [place] [address] [phone]   [place] [address], [place] [phone]   Planned Parenthood    [phone]   [facility]    [phone]   [place] and [place] [address], [place] Phone:  [phone], Fax:  [phone] Hours of Operation:  9 am - 6 pm, M-F.  Also accepts Medicaid/Medicare and self-pay.  [facility] for [place] [address],  [address], [place] Phone: [phone], Fax: [phone]. Hours of Operation:  8:30 am - 5:30 pm, M-F.  Also accepts Medicaid and self-pay.  [facility] [address] [place] Phone: [phone]   [place], [place], [place] [phone], Ext. [phone] Mondays & Thursdays: 7-9 AM.  First 15 patients are seen on a first come, first serve basis.    [place] Providers:  Organization         Address  Phone   Notes  [facility] [address], [place] [phone] Also accepts self-pay patients.  [facility] [address], [place]  [phone]   [place], [address], [place] [phone]   [facility] [address], [place] [phone]   Lucianne Lei [address], [place]   [phone] Only accepts [place] Access [place] patients after they have their name applied to their card.   Self-Pay (no insurance) in [facility]:  Organization         Address  Phone   Notes  Sickle Cell Patients, [facility] Internal Medicine [place] [phone]   [facility] [place] [phone]   [facility] [place]  [place], [place], [place] [phone]   [facility]/Dr. Osei-Bonsu  [address] [place] or [address], [place] [phone] Phone number for both [place] and [place] locations is the same.  Urgent Medical and [facility] [address] [place] [phone]   [facility] [address] [place] or [address] [phone]   [facility] [address], [place] [phone], phone; [phone], fax Sees patients 1st and 3rd Saturday of every month.  Must not qualify for public or private insurance (i.e. Medicaid, Medicare, [place] Health Choice, Veterans' Benefits)  Household income should be no more than 200% of the poverty level The clinic cannot treat you if you are pregnant or think you are pregnant  Sexually transmitted diseases are not treated at the clinic.    Dental Care: Organization         Address  Phone  Notes  [facility] Department of [facility] [place] [phone] Accepts children up to age 74 who are enrolled in [place] or [place]; pregnant women with a Medicaid card; and  children who have applied for Medicaid or [place] Health Choice, but were declined, whose parents can pay a reduced fee at time of service.  [facility] Department of [facility]  [address], [place] [phone] Accepts children up to age 79 who are enrolled in [place] or [place]; pregnant women with a Medicaid card; and children who have applied for Medicaid or [place] Health Choice, but were declined, whose parents can pay a reduced fee at time of service.  [place] Adult [facility] Access PROGRAM  [place] [phone] Patients are seen by appointment only. Walk-ins are not accepted. [place] will see patients 64 years of age and older. Monday - Tuesday (8am-5pm) Most Wednesdays (8:30-5pm) $30 per visit, cash only  [place]  [address], [place] [phone] Patients are seen by appointment only. Walk-ins are not accepted. [place] will see patients 49 years of age and older. One Wednesday Evening (Monthly: Volunteer Based).  $30 per visit, cash only  [place]  [phone] for adults; Children under age 68, call Graduate Pediatric Dentistry at [phone]. Children aged 74-14, please call [phone] to request a pediatric application.  Dental services are provided in all areas of dental care including fillings, crowns and bridges, complete and partial dentures, implants, gum treatment, root canals, and extractions. Preventive care is also provided. Treatment is provided to both adults and children. Patients are selected via a lottery and there is often a waiting list.   [facility] [address] [place]  [phone] [email or web address]   [facility] [address] [place], [place] [phone], Ext. [phone] Second and Fourth Thursday of each month, opens at 6:30 AM; Clinic ends at 9 AM.  Patients are seen on a first-come first-served  basis, and a limited number are seen during each clinic.   [facility]  [address] Hillard Danker [place], [place] [phone]   Eligibility Requirements You must have lived in [place], [place], or [place] counties for at least the last three months.   You cannot be eligible for state or federal sponsored [employer], including [employer], [place], or [employer].   You generally cannot be eligible for healthcare insurance through your employer.    How to apply: Eligibility screenings are held every Tuesday and Wednesday afternoon from 1:00 pm until 4:00 pm. You do not need an appointment for the interview!  [facility] [address] [place], [place]   [place]  [place] Department  [place]  [phone]    Behavioral Health Resources in the Community: Intensive Outpatient Programs Organization         Address  Phone  Notes  [place] Schulter. [address], [place], [place] [phone]   [facility] Outpatient [address] [place], [place]   ADS: Alcohol & Drug S[facility] [address] [place], [place]   [place] [address]  [place], [place] or [phone]   Substance Abuse Resources Organization         Address  Phone  Notes  Alcohol and Drug Services  [phone]   [place]  [phone]   [employer]   Chinita Pester  [phone]   Residential & Outpatient Substance Abuse Program  [phone]   Psychological Services Organization         Address  Phone  Notes  [facility] [place]  [place]  [phone]   [place] [address], [place] or [phone]    Mobile Crisis Teams Organization          Address  Phone  Notes  [facility]atives, Mobile Crisis Care Unit  [phone]   [facility] Psychotherapeutic Services  [address] [place], [place]   [place] [address], [place] [phone]    Self-Help/Support Groups Organization         Address  Phone             Notes  [place]. of [place] -  variety of support groups  [phone] Call for more information  Narcotics Anonymous (NA), Caring [facility] [address], [employer] [place]  2 meetings at this location   Residential Facilities manager         Address  Phone  Notes  [facility] [place],    [place]  [phone]   [facility]  [address] [place] [phone], [place], [place]   [place], [place] [phone] Admissions: 8am-3pm M-F  Incentives Substance [place] [address]    [place], [place] [phone]   [facility] [address] [place], [place], [place]   [facility] [address]  [place], [place]   [facility]ight Programs - Intensive Outpatient [address] Kristeen [address], [place], [place]   [facility] [place].,  [place], [place] [phone] or [phone]   [facility]idential Treatment Services (RTS) [address], [place], [place] Accepts Medicaid  [facility] [place] [address],  [place] [phone] Substance Abuse/Addiction Treatment   [facility] Organization         Address  Phone  Notes  [facility]  [phone]   Domenic Schwab, PhD [address] [place], [place]   [phone] or [phone]   [place], [place] [phone]   [facility] [address] [place], [place] [phone] Insurance/Medicaid/sponsorship  through [facility] and Families [address] [place]                                    [place], [place] [phone] [place] [address], [place] [phone]    Dr. Adele Schilder  [phone]   [facility] Dept. 1) [address] [place] 2) [place] 3)  [place] 65, [place] [phone]  [phone]   [place] [phone] or [phone] (After Hours)

## 2015-07-17 ENCOUNTER — Other Ambulatory Visit: Payer: Self-pay | Admitting: Neurology

## 2015-07-17 ENCOUNTER — Telehealth: Payer: Self-pay | Admitting: Neurology

## 2015-07-17 MED ORDER — MEMANTINE HCL 10 MG PO TABS: 10.0000 mg | ORAL_TABLET | Freq: Two times a day (BID) | ORAL | Status: DC

## 2015-07-17 NOTE — Telephone Encounter (Signed)
I spoke with [name], daughter.  As per below, due to cost change, would like to go to namenda 10mg  po bid.  [facility] [address]

## 2015-07-17 NOTE — Telephone Encounter (Signed)
Pt's daughter called  NAMENDA XR 28 MG CP24 24 hr capsule will cost $177/mth and cannot afford to pay (he is going into doughnut hole). Ins will pay for gen Namenda HCL 10mg  2x day but she sts he has taken it before and Dr Leonie Man changed to XR. Please call and advise [phone]

## 2015-07-17 NOTE — Telephone Encounter (Signed)
i agree. 

## 2015-07-17 NOTE — Telephone Encounter (Signed)
ok 

## 2015-07-18 ENCOUNTER — Encounter: Payer: Self-pay | Admitting: Internal Medicine

## 2015-07-18 ENCOUNTER — Telehealth: Payer: Self-pay | Admitting: *Deleted

## 2015-07-18 NOTE — Telephone Encounter (Signed)
Called patient's daughter, Lilli Light, regarding any symptoms he may have experienced during a tachy episode on 07/15/15.  Per daughter, patient had a fall that morning and she transported him to the ED to be checked as he thought he hit his head.  She states that he was discharged from the ED later that morning and that he did not have any injuries.  She states that he had another episode recently and was rubbing his chest.  She checked his BP and his HR, the automatic cuff measured his heart rate at 35bpm, but she states that she is aware that the HR was likely incorrect due to PVCs.  She states that after she talked with him, he was calm and stopped rubbing his chest.  She states that he was not diaphoretic or pale and that he did not appear to be in pain.  [name] is aware to call with worsening symptoms, questions, or concerns.  Episode placed in Dr. Janalyn Harder folder for review.  Will call [name] once reviewed if any further recommendations.

## 2015-07-19 ENCOUNTER — Other Ambulatory Visit: Payer: Self-pay | Admitting: Internal Medicine

## 2015-07-21 ENCOUNTER — Ambulatory Visit: Payer: Commercial Managed Care - HMO | Admitting: Physical Therapy

## 2015-07-21 ENCOUNTER — Encounter: Payer: Self-pay | Admitting: Physical Therapy

## 2015-07-21 DIAGNOSIS — R2689 Other abnormalities of gait and mobility: Secondary | ICD-10-CM

## 2015-07-21 DIAGNOSIS — R29818 Other symptoms and signs involving the nervous system: Secondary | ICD-10-CM | POA: Diagnosis not present

## 2015-07-21 DIAGNOSIS — R269 Unspecified abnormalities of gait and mobility: Secondary | ICD-10-CM | POA: Diagnosis not present

## 2015-07-21 DIAGNOSIS — Z7409 Other reduced mobility: Secondary | ICD-10-CM

## 2015-07-21 DIAGNOSIS — R2681 Unsteadiness on feet: Secondary | ICD-10-CM | POA: Diagnosis not present

## 2015-07-21 DIAGNOSIS — R531 Weakness: Secondary | ICD-10-CM | POA: Diagnosis not present

## 2015-07-21 DIAGNOSIS — W19XXXS Unspecified fall, sequela: Secondary | ICD-10-CM

## 2015-07-21 DIAGNOSIS — M623 Immobility syndrome (paraplegic): Secondary | ICD-10-CM | POA: Diagnosis not present

## 2015-07-21 DIAGNOSIS — M2560 Stiffness of unspecified joint, not elsewhere classified: Secondary | ICD-10-CM

## 2015-07-21 NOTE — Telephone Encounter (Signed)
I was following up on below.  I spoke to andrea this am, and they did receive the namenda 10mg  po bid.   They are to call back as needed.

## 2015-07-21 NOTE — Therapy (Signed)
[facility] [address] [place], [place], [address] Phone: [phone]   Fax:  [phone]  Physical Therapy Treatment  Patient Details  Name: [name] [name] MRN: [id number] Date of Birth: 15-Jan-1931 No Data Recorded  Encounter Date: 07/21/2015      PT End of Session - 07/21/15 1414    Visit Number 8   Number of Visits 17   Date for PT Re-Evaluation 08/10/15   Authorization Type G-code every 10th visit   PT Start Time 1450   PT Stop Time 1530   PT Time Calculation (min) 40 min   Activity Tolerance Patient tolerated treatment well   Behavior During Therapy [facility] for tasks assessed/performed      Past Medical History  Diagnosis Date  . PE (pulmonary embolism)     unprovoked PE completed 6 months of warfarin, warfarin d/ced 02/12/2010, repeat PE 07/10/2010 after a long car ride and now on lifelong coumadin.  [name] Hypertension   . Pituitary microadenoma ([place])     incidental finding CT 12/2009  . Prostate cancer ([place])   . Depression   . Hyperlipidemia   . Coronary artery disease, non-occlusive     with history of MI; Cath 2008 w multivessel nonobstructive CAD  . Dementia   . GERD (gastroesophageal reflux disease)   . Scoliosis   . Anxiety     Past Surgical History  Procedure Laterality Date  . Prostatectomy    . Left heart catheterization with coronary angiogram N/A 12/10/2014    Procedure: LEFT HEART CATHETERIZATION WITH CORONARY ANGIOGRAM;  Surgeon: [name], MD;  [facility];  Service: Cardiovascular;  Laterality: N/A;  . Loop recorder implant N/A 12/16/2014    Procedure: LOOP RECORDER IMPLANT;  Surgeon: [name], MD;  [facility];  Service: Cardiovascular;  Laterality: N/A;    There were no vitals filed for this visit.  Visit Diagnosis:  Unsteadiness  Weakness generalized  Abnormality of gait  Balance problems  Falls, sequela  Stiffness due to immobility       Subjective Assessment - 07/21/15 1411    Subjective No new falls since the one that sent him to ED. [place] with going through doorway area with turning. Was not witnessed, daughter heard him fall. Was assited up by daughter and granddaughter. Called ems only due to pt stating he hit his head.  Was cleared in ED for no fx's or bleeds. No pain. Doing HEP with daughter assisting/performing with him.    Currently in Pain? No/denies   Pain Score 0-No pain           [facility] Adult PT Treatment/Exercise - 07/21/15 1555    Transfers   Sit to Stand 4: Min guard;With upper extremity assist;From bed;Multiple attempts;4: Min assist   Sit to Stand Details Visual cues for safe use of DME/AE;Visual cues/gestures for precautions/safety;Verbal cues for precautions/safety;Verbal cues for technique;Verbal cues for sequencing   Stand to Sit 4: Min guard;With upper extremity assist;To bed;Uncontrolled descent;4: Min assist   Stand to Sit Details (indicate cue type and reason) Visual cues for safe use of DME/AE;Visual cues/gestures for precautions/safety;Verbal cues for technique;Verbal cues for sequencing;Verbal cues for precautions/safety;Verbal cues for safe use of DME/AE   Ambulation/Gait   Ambulation/Gait Yes   Ambulation/Gait Assistance 4: Min guard;5: Supervision   Ambulation/Gait Assistance Details moderate cues with both on posture, device position with gait and for increased step/stride length with gait.  Ambulation Distance (Feet) 238 Feet  x1 with FX;832 x1 with rollator   Assistive device Rolling walker;Rollator   Gait Pattern Step-through pattern;Decreased stride length;Shuffle   Ambulation Surface Level;Indoor     standing with chair in front for balance as needed: Alternating "big" steps out/back in x 10 each side, progressed to taking same side arm out/in with legs x 10 each side. Visual and verbal cues needed on form and technique.  PWR! Standing twist trunk  rotation, blocked practice with verbal, visual and tactile cues. Pt with decreased trunk rotation despite cues and facilitation.         PT Short Term Goals - 07/21/15 1415    PT SHORT TERM GOAL #1   Title Patient verbalizes fall prevention strategies. (Target Date: 07/11/2015)   Status New   PT SHORT TERM GOAL #2   Title Patient demonstrates understanding of [place] exercise program. (Target Date: 07/11/2015)   Baseline met on 07/09/15   Status Achieved   PT SHORT TERM GOAL #3   Title Patient ambulates with rollator walker 150' with supervision to initiate safer community mobility. (Target Date: 07/11/2015)   Status New           PT Long Term Goals - 06/11/15 1100    PT LONG TERM GOAL #1   Title Patient & daughter demonstrate / verbalioize understanding of ongoing HEP / fitness plan. (Target Date: 08/08/2015)   Time 8   Period Weeks   Status New   PT LONG TERM GOAL #2   Title Five times sit to stand <15 sec (Target Date: 08/08/2015)   Time 8   Period Weeks   Status New   PT LONG TERM GOAL #3   Title Patient ambulates 250' with LRAD with SBA safely. (Target Date: 08/08/2015)   Time 8   Period Weeks   Status New   PT LONG TERM GOAL #4   Title Patient negotiates ramps, curbs and stairs with LRAD with supervision. (Target Date: 08/08/2015)   Time 8   Period Weeks   Status New   PT LONG TERM GOAL #5   Title Patient ambulates around furniture carrying cup of water with LRAD modified independent. (Target Date: 08/08/2015)   Time 8   Period Weeks   Status New           Plan - 07/21/15 1414    Clinical Impression Statement Recent hospitalization discussed with primary PT prior to session and PT felt current goals/plan of care still appropriate. Pt continues to need cues on general safety with transfers and gait. Daughter reports they have bought an alarm for his bed at home, it goes off as soon as it sense's movement near bottom of bed (like bringing feet by it while  lowering them to the gorund). Pt. did recall to lock rollator prior to sitting down.                                                   Pt will benefit from skilled therapeutic intervention in order to improve on the following deficits Abnormal gait;Decreased activity tolerance;Decreased balance;Decreased endurance;Decreased mobility;Decreased safety awareness;Decreased range of motion;Decreased strength;Impaired flexibility   Rehab Potential Good   PT Frequency 2x / week   PT Duration 8 weeks   PT Treatment/Interventions ADLs/Self Care Home Management;DME Instruction;Gait training;Stair training;Functional mobility training;Therapeutic activities;Therapeutic exercise;Balance training;Neuromuscular  re-education;Patient/family education   PT Next Visit Plan continue with gait traiing with walker/rollator., especially furniture/obstacle negotiations.   PT Home Exercise Plan 10/5: completed instruction of OTAGO   Consulted and Agree with Plan of Care Patient;Family member/caregiver   Family Member Consulted daughter, [name]        Problem List Patient Active Problem List   Diagnosis Date Noted  . Leukopenia 04/16/2015  . Normocytic anemia 04/16/2015  . Chronic systolic heart failure ([address]) 04/16/2015  . Falls frequently 04/16/2015  . Onychomycosis 04/15/2015  . CAD (coronary artery disease) 01/16/2015  . Faintness   . 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 Syncope 12/07/2014  . Healthcare maintenance 10/10/2014  . Somnolence, daytime 10/01/2014  . Acute pain of right hip 02/13/2014  . Lumbosacral spondylosis without myelopathy 09/24/2013  . Symptomatic bradycardia 02/20/2013  . Generalized ischemic cerebrovascular disease 02/05/2013  . Depression 01/25/2013  . Penile bleeding 01/19/2013  . Constipation 01/20/2012  . Insomnia 01/20/2012  . Alzheimer's dementia 08/17/2011  . Pulmonary embolism ([place]) 09/18/2010  . Encounter for long-term use of antiplatelets/antithrombotics 09/18/2010  . DJD  (degenerative joint disease) 08/06/2010  . ADENOCARCINOMA, PROSTATE, HX OF 08/06/2010  . HLD (hyperlipidemia) 10/15/2009  . Essential hypertension, benign 10/15/2009  . GERD 10/15/2009    Willow Ora 07/21/2015, 4:12 PM  Willow Ora, PTA, [facility] Outpatient Neuro [facility] [address] [place], [place] [address] [phone] 07/21/2015, 4:12 PM   Name: [name] [name] MRN: [id number] Date of Birth: [birth year]

## 2015-07-22 ENCOUNTER — Encounter: Payer: Self-pay | Admitting: Internal Medicine

## 2015-07-23 ENCOUNTER — Encounter: Payer: Self-pay | Admitting: Physical Therapy

## 2015-07-23 ENCOUNTER — Ambulatory Visit: Payer: Commercial Managed Care - HMO | Attending: Internal Medicine | Admitting: Physical Therapy

## 2015-07-23 DIAGNOSIS — R29818 Other symptoms and signs involving the nervous system: Secondary | ICD-10-CM | POA: Insufficient documentation

## 2015-07-23 DIAGNOSIS — R2689 Other abnormalities of gait and mobility: Secondary | ICD-10-CM

## 2015-07-23 DIAGNOSIS — M623 Immobility syndrome (paraplegic): Secondary | ICD-10-CM | POA: Insufficient documentation

## 2015-07-23 DIAGNOSIS — R269 Unspecified abnormalities of gait and mobility: Secondary | ICD-10-CM | POA: Insufficient documentation

## 2015-07-23 DIAGNOSIS — R531 Weakness: Secondary | ICD-10-CM

## 2015-07-23 DIAGNOSIS — R2681 Unsteadiness on feet: Secondary | ICD-10-CM | POA: Diagnosis not present

## 2015-07-23 DIAGNOSIS — Z7409 Other reduced mobility: Secondary | ICD-10-CM

## 2015-07-23 DIAGNOSIS — M2560 Stiffness of unspecified joint, not elsewhere classified: Secondary | ICD-10-CM

## 2015-07-23 DIAGNOSIS — W19XXXS Unspecified fall, sequela: Secondary | ICD-10-CM

## 2015-07-23 NOTE — Therapy (Signed)
[facility] [address] [place], [place], [address] Phone: [phone]   Fax:  [phone]  Physical Therapy Treatment  Patient Details  Name: [name] [name] MRN: [id number] Date of Birth: 1931/06/26 No Data Recorded  Encounter Date: 07/23/2015      PT End of Session - 07/23/15 1618    Visit Number 9   Number of Visits 17   Date for PT Re-Evaluation 08/10/15   Authorization Type G-code every 10th visit   PT Start Time 1400   PT Stop Time 1445   PT Time Calculation (min) 45 min   Equipment Utilized During Treatment Gait belt   Activity Tolerance Patient tolerated treatment well   Behavior During Therapy [facility] for tasks assessed/performed      Past Medical History  Diagnosis Date  . PE (pulmonary embolism)     unprovoked PE completed 6 months of warfarin, warfarin d/ced 02/12/2010, repeat PE 07/10/2010 after a long car ride and now on lifelong coumadin.  [name] Hypertension   . Pituitary microadenoma ([place])     incidental finding CT 12/2009  . Prostate cancer ([place])   . Depression   . Hyperlipidemia   . Coronary artery disease, non-occlusive     with history of MI; Cath 2008 w multivessel nonobstructive CAD  . Dementia   . GERD (gastroesophageal reflux disease)   . Scoliosis   . Anxiety     Past Surgical History  Procedure Laterality Date  . Prostatectomy    . Left heart catheterization with coronary angiogram N/A 12/10/2014    Procedure: LEFT HEART CATHETERIZATION WITH CORONARY ANGIOGRAM;  Surgeon: [name], MD;  Location: [facility] CATH LAB;  Service: Cardiovascular;  Laterality: N/A;  . Loop recorder implant N/A 12/16/2014    Procedure: LOOP RECORDER IMPLANT;  Surgeon: [name], MD;  Location: [facility] CATH LAB;  Service: Cardiovascular;  Laterality: N/A;    There were no vitals filed for this visit.  Visit Diagnosis:  Unsteadiness  Weakness generalized  Balance problems  Abnormality of gait  Falls,  sequela  Stiffness due to immobility      Subjective Assessment - 07/23/15 1616    Subjective Pt. reports no pain or changes in status.    Limitations Walking;Standing;Lifting   Currently in Pain? No/denies   Multiple Pain Sites No           PT Education - 07/23/15 1617    Education provided Yes   Education Details Pt. and daughter educated and given handout on fall prevention techniques and guidelines.   Person(s) Educated Patient;Caregiver(s)   Methods Explanation   Comprehension Verbalized understanding     Bed mobility: To improve pt.'s ability to adjust position in bed. Pt. Mod assist with sitting<>supine on mat with verbal cues for sequence and extremity placement.  Therapeutic exercise: to increase strength, endurance, and balance to increase functional independence.  Supine power exercise: Walking feet left and right then bridging to meet legs and then moving back to midline. 5 reps each way. Rolling over with momentum to clap outstretched UE and returning to midline with both UE stretched out as far as possible. 5 reps each way. Seated on mat: UE's outstretched to side while facing forward, rotating at hips, and reaching with one extremity to clap the opposite outstretched extremity.   Gait details: Pt. Completed a total of 49ft. on indoor level surfaces with furniture navigation with min guard assist for safety with RW. Verbal cues to navigate furniture. Pt. with increased  time to cognitively process changes in direction. Pt. with hip flexed posture and places walker too far out in front. Cues provided to attempt to correct this with limited carryover noted within session. Pt. Completed 233ft. On outdoor, unlevel surfaces with contact guard assist with rollator. Verbal Cues to avoid obstacles. Increased time to navigate around obstacles.  Pt. Turns better with RW than with rollator. Rollator seems to get ahead of him and cause his feet to shift out of the rollator BOS  during turns.      PT Short Term Goals - 07/23/15 1623    PT SHORT TERM GOAL #1   Title Patient verbalizes fall prevention strategies. (Target Date: 07/11/2015)   Baseline Achieved 07/23/15   Status Achieved   PT SHORT TERM GOAL #2   Title Patient demonstrates understanding of [place] exercise program. (Target Date: 07/11/2015)   Baseline met on 07/09/15   Status Achieved   PT SHORT TERM GOAL #3   Title Patient ambulates with rollator walker 150' with supervision to initiate safer community mobility. (Target Date: 07/11/2015)   Baseline 07/23/15: pt not at supervision with community mobility.   Status Not Met           PT Long Term Goals - 07/23/15 1623    PT LONG TERM GOAL #1   Title Patient & daughter demonstrate / verbalioize understanding of ongoing HEP / fitness plan. (Target Date: 08/08/2015)   Time 8   Period Weeks   Status New   PT LONG TERM GOAL #2   Title Five times sit to stand <15 sec (Target Date: 08/08/2015)   Time 8   Period Weeks   Status New   PT LONG TERM GOAL #3   Title Patient ambulates 250' with LRAD with SBA safely. (Target Date: 08/08/2015)   Time 8   Period Weeks   Status New   PT LONG TERM GOAL #4   Title Patient negotiates ramps, curbs and stairs with LRAD with supervision. (Target Date: 08/08/2015)   Time 8   Period Weeks   Status New   PT LONG TERM GOAL #5   Title Patient ambulates around furniture carrying cup of water with LRAD modified independent. (Target Date: 08/08/2015)   Time 8   Period Weeks   Status New        07/23/15 1619  Plan  Clinical Impression Statement Pt. has achieved STG's 1 and 2. Pt. is working toward STG 3. Pt completed 236ft. with min guard assist on outdoor, paved and  unlevel surfaces with obstacle of going through rubber mulch.   Pt will benefit from skilled therapeutic intervention in order to improve on the following deficits Abnormal gait;Decreased activity tolerance;Decreased balance;Decreased  endurance;Decreased mobility;Decreased safety awareness;Decreased range of motion;Decreased strength;Impaired flexibility  Rehab Potential Good  PT Frequency 2x / week  PT Duration 8 weeks  PT Treatment/Interventions ADLs/Self Care Home Management;DME Instruction;Gait training;Stair training;Functional mobility training;Therapeutic activities;Therapeutic exercise;Balance training;Neuromuscular re-education;Patient/family education  PT Next Visit Plan G- code next visit. Teach more power moves to increase funcitonal strength. Work on curbs with the rollator.  PT Home Exercise Plan 10/5: completed instruction of [facility]  Consulted and Agree with Plan of Care Patient;Family member/caregiver  Family Member Consulted daughter, [name]        Problem List Patient Active Problem List   Diagnosis Date Noted  . Leukopenia 04/16/2015  . Normocytic anemia 04/16/2015  . Chronic systolic heart failure ([place]) 04/16/2015  . Falls frequently 04/16/2015  . Onychomycosis 04/15/2015  .  CAD (coronary artery disease) 01/16/2015  . Faintness   . 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 Syncope 12/07/2014  . Healthcare maintenance 10/10/2014  . Somnolence, daytime 10/01/2014  . Acute pain of right hip 02/13/2014  . Lumbosacral spondylosis without myelopathy 09/24/2013  . Symptomatic bradycardia 02/20/2013  . Generalized ischemic cerebrovascular disease 02/05/2013  . Depression 01/25/2013  . Penile bleeding 01/19/2013  . Constipation 01/20/2012  . Insomnia 01/20/2012  . Alzheimer's dementia 08/17/2011  . Pulmonary embolism ([place]) 09/18/2010  . Encounter for long-term use of antiplatelets/antithrombotics 09/18/2010  . DJD (degenerative joint disease) 08/06/2010  . ADENOCARCINOMA, PROSTATE, HX OF 08/06/2010  . HLD (hyperlipidemia) 10/15/2009  . Essential hypertension, benign 10/15/2009  . GERD 10/15/2009    Laney Potash 07/24/2015, 5:10 PM  Laney Potash, [place]  Name: [name] [name] MRN:  [id number] Date of Birth: 1931-09-08  This note has been reviewed and edited by supervision CI.  Willow Ora, PTA, [facility] [address] [place],  [address] [phone] 07/25/2015, 12:36 PM

## 2015-07-23 NOTE — Patient Instructions (Signed)
Fall Prevention in the Home   Falls can cause injuries. They can happen to people of all ages. There are many things you can do to make your home safe and to help prevent falls.   WHAT CAN I DO ON THE OUTSIDE OF MY HOME?  · Regularly fix the edges of walkways and driveways and fix any cracks.  · Remove anything that might make you trip as you walk through a door, such as a raised step or threshold.  · Trim any bushes or trees on the path to your home.  · Use bright outdoor lighting.  · Clear any walking paths of anything that might make someone trip, such as rocks or tools.  · Regularly check to see if handrails are loose or broken. Make sure that both sides of any steps have handrails.  · Any raised decks and porches should have guardrails on the edges.  · Have any leaves, snow, or ice cleared regularly.  · Use sand or salt on walking paths during winter.  · Clean up any spills in your garage right away. This includes oil or grease spills.  WHAT CAN I DO IN THE BATHROOM?   · Use night lights.  · Install grab bars by the toilet and in the tub and shower. Do not use towel bars as grab bars.  · Use non-skid mats or decals in the tub or shower.  · If you need to sit down in the shower, use a plastic, non-slip stool.  · Keep the floor dry. Clean up any water that spills on the floor as soon as it happens.  · Remove soap buildup in the tub or shower regularly.  · Attach bath mats securely with double-sided non-slip rug tape.  · Do not have throw rugs and other things on the floor that can make you trip.  WHAT CAN I DO IN THE BEDROOM?  · Use night lights.  · Make sure that you have a light by your bed that is easy to reach.  · Do not use any sheets or blankets that are too big for your bed. They should not hang down onto the floor.  · Have a firm chair that has side arms. You can use this for support while you get dressed.  · Do not have throw rugs and other things on the floor that can make you trip.  WHAT CAN I DO IN  THE KITCHEN?  · Clean up any spills right away.  · Avoid walking on wet floors.  · Keep items that you use a lot in easy-to-reach places.  · If you need to reach something above you, use a strong step stool that has a grab bar.  · Keep electrical cords out of the way.  · Do not use floor polish or wax that makes floors slippery. If you must use wax, use non-skid floor wax.  · Do not have throw rugs and other things on the floor that can make you trip.  WHAT CAN I DO WITH MY STAIRS?  · Do not leave any items on the stairs.  · Make sure that there are handrails on both sides of the stairs and use them. Fix handrails that are broken or loose. Make sure that handrails are as long as the stairways.  · Check any carpeting to make sure that it is firmly attached to the stairs. Fix any carpet that is loose or worn.  · Avoid having throw rugs at the top   or bottom of the stairs. If you do have throw rugs, attach them to the floor with carpet tape.  · Make sure that you have a light switch at the top of the stairs and the bottom of the stairs. If you do not have them, ask someone to add them for you.  WHAT ELSE CAN I DO TO HELP PREVENT FALLS?  · Wear shoes that:    Do not have high heels.    Have rubber bottoms.    Are comfortable and fit you well.    Are closed at the toe. Do not wear sandals.  · If you use a stepladder:    Make sure that it is fully opened. Do not climb a closed stepladder.    Make sure that both sides of the stepladder are locked into place.    Ask someone to hold it for you, if possible.  · Clearly mark and make sure that you can see:    Any grab bars or handrails.    First and last steps.    Where the edge of each step is.  · Use tools that help you move around (mobility aids) if they are needed. These include:    Canes.    Walkers.    Scooters.    Crutches.  · Turn on the lights when you go into a dark area. Replace any light bulbs as soon as they burn out.  · Set up your furniture so you have a clear  path. Avoid moving your furniture around.  · If any of your floors are uneven, fix them.  · If there are any pets around you, be aware of where they are.  · Review your medicines with your doctor. Some medicines can make you feel dizzy. This can increase your chance of falling.  Ask your doctor what other things that you can do to help prevent falls.     This information is not intended to replace advice given to you by your health care provider. Make sure you discuss any questions you have with your health care provider.     Document Released: 07/03/2009 Document Revised: 01/21/2015 Document Reviewed: 10/11/2014  Elsevier Interactive Patient Education ©2016 [employer]

## 2015-07-27 ENCOUNTER — Emergency Department (HOSPITAL_COMMUNITY): Payer: Commercial Managed Care - HMO

## 2015-07-27 ENCOUNTER — Emergency Department (HOSPITAL_COMMUNITY)
Admission: EM | Admit: 2015-07-27 | Discharge: 2015-07-27 | Disposition: A | Discharge status: Home / Self Care | Payer: Commercial Managed Care - HMO | Attending: Emergency Medicine | Admitting: Emergency Medicine

## 2015-07-27 ENCOUNTER — Encounter (HOSPITAL_COMMUNITY): Payer: Self-pay | Admitting: Emergency Medicine

## 2015-07-27 DIAGNOSIS — I2510 Atherosclerotic heart disease of native coronary artery without angina pectoris: Secondary | ICD-10-CM | POA: Diagnosis not present

## 2015-07-27 DIAGNOSIS — Z8659 Personal history of other mental and behavioral disorders: Secondary | ICD-10-CM | POA: Diagnosis not present

## 2015-07-27 DIAGNOSIS — F0390 Unspecified dementia without behavioral disturbance: Secondary | ICD-10-CM | POA: Insufficient documentation

## 2015-07-27 DIAGNOSIS — E785 Hyperlipidemia, unspecified: Secondary | ICD-10-CM | POA: Diagnosis not present

## 2015-07-27 DIAGNOSIS — Z7982 Long term (current) use of aspirin: Secondary | ICD-10-CM | POA: Diagnosis not present

## 2015-07-27 DIAGNOSIS — S0990XA Unspecified injury of head, initial encounter: Secondary | ICD-10-CM | POA: Diagnosis not present

## 2015-07-27 DIAGNOSIS — Z9889 Other specified postprocedural states: Secondary | ICD-10-CM | POA: Diagnosis not present

## 2015-07-27 DIAGNOSIS — W108XXA Fall (on) (from) other stairs and steps, initial encounter: Secondary | ICD-10-CM | POA: Diagnosis not present

## 2015-07-27 DIAGNOSIS — Z8739 Personal history of other diseases of the musculoskeletal system and connective tissue: Secondary | ICD-10-CM | POA: Insufficient documentation

## 2015-07-27 DIAGNOSIS — Z86711 Personal history of pulmonary embolism: Secondary | ICD-10-CM | POA: Insufficient documentation

## 2015-07-27 DIAGNOSIS — Z79899 Other long term (current) drug therapy: Secondary | ICD-10-CM | POA: Diagnosis not present

## 2015-07-27 DIAGNOSIS — Y9389 Activity, other specified: Secondary | ICD-10-CM | POA: Diagnosis not present

## 2015-07-27 DIAGNOSIS — Z862 Personal history of diseases of the blood and blood-forming organs and certain disorders involving the immune mechanism: Secondary | ICD-10-CM | POA: Insufficient documentation

## 2015-07-27 DIAGNOSIS — S0083XA Contusion of other part of head, initial encounter: Secondary | ICD-10-CM | POA: Insufficient documentation

## 2015-07-27 DIAGNOSIS — W19XXXA Unspecified fall, initial encounter: Secondary | ICD-10-CM

## 2015-07-27 DIAGNOSIS — K219 Gastro-esophageal reflux disease without esophagitis: Secondary | ICD-10-CM | POA: Diagnosis not present

## 2015-07-27 DIAGNOSIS — Y998 Other external cause status: Secondary | ICD-10-CM | POA: Insufficient documentation

## 2015-07-27 DIAGNOSIS — L8992 Pressure ulcer of unspecified site, stage 2: Secondary | ICD-10-CM | POA: Diagnosis not present

## 2015-07-27 DIAGNOSIS — I10 Essential (primary) hypertension: Secondary | ICD-10-CM | POA: Insufficient documentation

## 2015-07-27 DIAGNOSIS — S0081XA Abrasion of other part of head, initial encounter: Secondary | ICD-10-CM | POA: Diagnosis not present

## 2015-07-27 DIAGNOSIS — Z8546 Personal history of malignant neoplasm of prostate: Secondary | ICD-10-CM | POA: Insufficient documentation

## 2015-07-27 DIAGNOSIS — Y9289 Other specified places as the place of occurrence of the external cause: Secondary | ICD-10-CM | POA: Insufficient documentation

## 2015-07-27 DIAGNOSIS — Z7901 Long term (current) use of anticoagulants: Secondary | ICD-10-CM | POA: Insufficient documentation

## 2015-07-27 DIAGNOSIS — R410 Disorientation, unspecified: Secondary | ICD-10-CM | POA: Diagnosis not present

## 2015-07-27 LAB — PROTIME-INR
INR: 2.05 — ABNORMAL HIGH (ref 0.00–1.49)
Prothrombin Time: 23 seconds — ABNORMAL HIGH (ref 11.6–15.2)

## 2015-07-27 LAB — CBC WITH DIFFERENTIAL/PLATELET
BASOS ABS: 0 10*3/uL (ref 0.0–0.1)
Basophils Relative: 1 %
EOS ABS: 0.1 10*3/uL (ref 0.0–0.7)
Eosinophils Relative: 2 %
HCT: 36.2 % — ABNORMAL LOW (ref 39.0–52.0)
HEMOGLOBIN: 11.8 g/dL — AB (ref 13.0–17.0)
Lymphocytes Relative: 34 %
Lymphs Abs: 1.1 10*3/uL (ref 0.7–4.0)
MCH: 29.7 pg (ref 26.0–34.0)
MCHC: 32.6 g/dL (ref 30.0–36.0)
MCV: 91.2 fL (ref 78.0–100.0)
MONOS PCT: 8 %
Monocytes Absolute: 0.3 10*3/uL (ref 0.1–1.0)
NEUTROS ABS: 1.8 10*3/uL (ref 1.7–7.7)
NEUTROS PCT: 55 %
Platelets: 154 10*3/uL (ref 150–400)
RBC: 3.97 MIL/uL — ABNORMAL LOW (ref 4.22–5.81)
RDW: 17.8 % — AB (ref 11.5–15.5)
WBC: 3.2 10*3/uL — ABNORMAL LOW (ref 4.0–10.5)

## 2015-07-27 LAB — COMPREHENSIVE METABOLIC PANEL
ALBUMIN: 3.6 g/dL (ref 3.5–5.0)
ALK PHOS: 50 U/L (ref 38–126)
ALT: 15 U/L — ABNORMAL LOW (ref 17–63)
ANION GAP: 6 (ref 5–15)
AST: 44 U/L — AB (ref 15–41)
BUN: 14 mg/dL (ref 6–20)
CALCIUM: 9 mg/dL (ref 8.9–10.3)
CO2: 27 mmol/L (ref 22–32)
Chloride: 107 mmol/L (ref 101–111)
Creatinine, Ser: 0.88 mg/dL (ref 0.61–1.24)
GFR calc Af Amer: >60 mL/min (ref >60)
GFR calc non Af Amer: >60 mL/min (ref >60)
GLUCOSE: 91 mg/dL (ref 65–99)
POTASSIUM: 3.9 mmol/L (ref 3.5–5.1)
SODIUM: 140 mmol/L (ref 135–145)
TOTAL PROTEIN: 6.9 g/dL (ref 6.5–8.1)
Total Bilirubin: 1.1 mg/dL (ref 0.3–1.2)

## 2015-07-27 NOTE — Discharge Instructions (Signed)
You have been seen today for a fall. Your imaging and lab tests showed no new abnormalities. Follow up with PCP as needed. Return to ED should symptoms worsen.

## 2015-07-27 NOTE — ED Notes (Signed)
Bed: FO27 Expected date: 07/27/15 Expected time: 11:01 AM Means of arrival: Ambulance Comments: Fall

## 2015-07-27 NOTE — ED Notes (Signed)
RN given message that Pts daughter was concerned about caring for both her mother and father at home.  Pts daughter offered Social Work consult to discuss her at home care concerns for her mother and her father (the patient).  Daughter refused SW consult stating that she has adequate help at this time.

## 2015-07-27 NOTE — ED Notes (Signed)
EMS reports no LOC following fall.  C collar in place.

## 2015-07-27 NOTE — ED Provider Notes (Signed)
CSN: [id number]     Arrival date & time 07/27/15  1054 History   First MD Initiated Contact with Patient 07/27/15 1140     Chief Complaint  Patient presents with  . Fall     (Consider location/radiation/quality/duration/timing/severity/associated sxs/prior Treatment) HPI   Zadin Lange is a 81 y.o. male, with a history of PE, CAD, Dementia, and Prostate cancer, presenting to the ED with injuries from a fall down some front porch steps. Pt got to the second step from the bottom, stumbled, and fell into the mulch. Pt is accompanied by daughter, [name], at the bedside who gives most of the details of this incident. [name] states pt did hit his head, but is not sure on what. Denies loss of consciousness, increased confusion, N/V, chest pain, shortness of breath, visual disturbances, or any other pain or complaints. Pt currently denies head pain, but pt daughter states that pt was complaining of left sided head pain during EMS transport. Pt usual mental status is that pt knows who he is, what family member is with him, and simple facts, such as his birthday. Pt seems to be presenting with his normal level of consciousness. Daughter agrees with this assessment. Last seen in ED on 10/26, also for a fall.  Pt normally walks with a cane, but needs extra assistance to ambulate at times. Daughter states that pt has parkinsonian syndrome that affects his legs and his gait.  Past Medical History  Diagnosis Date  . PE (pulmonary embolism)     unprovoked PE completed 6 months of warfarin, warfarin d/ced 02/12/2010, repeat PE 07/10/2010 after a long car ride and now on lifelong coumadin.  [name] Hypertension   . Pituitary microadenoma ([place])     incidental finding CT 12/2009  . Prostate cancer ([place])   . Depression   . Hyperlipidemia   . Coronary artery disease, non-occlusive     with history of MI; Cath 2008 w multivessel nonobstructive CAD  . Dementia   . GERD (gastroesophageal reflux disease)   . Scoliosis    . Anxiety    Past Surgical History  Procedure Laterality Date  . Prostatectomy    . Left heart catheterization with coronary angiogram N/A 12/10/2014    Procedure: LEFT HEART CATHETERIZATION WITH CORONARY ANGIOGRAM;  Surgeon: [name], MD;  Location: [facility] CATH LAB;  Service: Cardiovascular;  Laterality: N/A;  . Loop recorder implant N/A 12/16/2014    Procedure: LOOP RECORDER IMPLANT;  Surgeon: [name], MD;  Location: [facility] CATH LAB;  Service: Cardiovascular;  Laterality: N/A;   Family History  Problem Relation Age of Onset  . Hypertension Father     Passed away from cerebral hemorrhage at age of 69.  [name] Dementia Mother     Passed away  . Hypertension Child     4 adult children  . Cervical cancer Other   . Lung cancer Other   . Stroke Other   . Heart attack Neg Hx    Social History  Substance Use Topics  . Smoking status: Never Smoker   . Smokeless tobacco: Never Used  . Alcohol Use: No    Review of Systems  Respiratory: Negative for shortness of breath.   Gastrointestinal: Negative for abdominal pain.  Neurological: Positive for headaches. Negative for dizziness, syncope, facial asymmetry, weakness, light-headedness and numbness.  All other systems reviewed and are negative.     Allergies  Aricept and Other  Home Medications   Prior to Admission medications   Medication Sig  Start Date End Date Taking? Authorizing Provider  acetaminophen (TYLENOL) 500 MG tablet Take 500 mg by mouth every 4 (four) hours as needed for mild pain or headache.   Yes Historical Provider, MD  amLODipine (NORVASC) 2.5 MG tablet TAKE 1 TABLET (2.5 MG TOTAL) BY MOUTH DAILY. 07/08/15  Yes [name], MD  aspirin EC 81 MG EC tablet Take 1 tablet (81 mg total) by mouth daily. 12/17/14  Yes Isaiah Serge, NP  atorvastatin (LIPITOR) 40 MG tablet TAKE 1 TABLET (40 MG TOTAL) BY MOUTH EVERY EVENING. 07/14/15  Yes Francesca Oman, DO  b complex vitamins tablet Take 1 tablet by mouth daily.    Yes Historical Provider, MD  Calcium Carbonate-Vitamin D (CALCIUM-VITAMIN D) 500-200 MG-UNIT per tablet Take 1 tablet by mouth daily.   Yes Historical Provider, MD  carbidopa-levodopa (SINEMET IR) 25-100 MG per tablet Take 1 tablet by mouth 2 (two) times daily. 12/17/14  Yes Isaiah Serge, NP  fish oil-omega-3 fatty acids 1000 MG capsule Take 2 g by mouth daily.    Yes Historical Provider, MD  furosemide (LASIX) 20 MG tablet Take 1 tablet (20 mg total) by mouth daily. 04/14/15  Yes Scott T Kathlen Mody, PA-C  memantine (NAMENDA) 10 MG tablet Take 1 tablet (10 mg total) by mouth 2 (two) times daily. 07/17/15  Yes Garvin Fila, MD  Multiple Vitamins-Minerals (MULTIVITAMIN WITH MINERALS) tablet Take 1 tablet by mouth daily.   Yes Historical Provider, MD  omeprazole (PRILOSEC) 20 MG capsule TAKE 1 CAPSULE (20 MG TOTAL) BY MOUTH DAILY. 07/23/15  Yes Francesca Oman, DO  potassium chloride (K-DUR) 10 MEQ tablet Take 1 tablet (10 mEq total) by mouth as directed. 1st dose take 2 tabs = 20 meq then decrease to 1 tab = 10 meq daily 07/10/15  Yes [name], MD  vitamin C (ASCORBIC ACID) 500 MG tablet Take 500 mg by mouth daily.   Yes Historical Provider, MD  warfarin (COUMADIN) 5 MG tablet Take as directed. Patient taking differently: Take 5-7.5 mg by mouth daily. 7.5mg  on Monday and 5 mg all other days 07/10/15  Yes Francesca Oman, DO   BP 142/88 mmHg  Pulse 62  Temp(Src) 97.8 F (36.6 C) (Oral)  Resp 19  SpO2 97% Physical Exam  Constitutional: He appears well-developed and well-nourished. No distress.  HENT:  Head: Normocephalic and atraumatic.  Bruising noted under right eye and on right forehead without instability or crepitus.  Eyes: Conjunctivae are normal. Pupils are equal, round, and reactive to light.  Neck: Neck supple.  Cardiovascular: Normal rate, regular rhythm and normal heart sounds.   Pulmonary/Chest: Effort normal and breath sounds normal. No respiratory distress.  Abdominal: Soft.  Bowel sounds are normal.  Musculoskeletal: Normal range of motion. He exhibits no edema or tenderness.  No paraspinal tenderness.  Neurological: He is alert. He has normal reflexes.  No sensory deficits. Strength 5/5 in all extremities. Gait testing deferred. Cranial nerves II-XII grossly intact. Alert and oriented to self and location, which patient's daughter states is his normal level of consciousness.   Skin: Skin is warm and dry. He is not diaphoretic.  Nursing note and vitals reviewed.   ED Course  Procedures (including critical care time) Labs Review Labs Reviewed  CBC WITH DIFFERENTIAL/PLATELET - Abnormal; Notable for the following:    WBC 3.2 (*)    RBC 3.97 (*)    Hemoglobin 11.8 (*)    HCT 36.2 (*)    RDW  17.8 (*)    All other components within normal limits  COMPREHENSIVE METABOLIC PANEL  PROTIME-INR    Imaging Review Ct Head Wo Contrast  07/27/2015  CLINICAL DATA:  81 year old male with history of trauma from a fall down a flight of steps landing onto is face. Patient is on Coumadin. Confusion. EXAM: CT HEAD WITHOUT CONTRAST CT MAXILLOFACIAL WITHOUT CONTRAST CT CERVICAL SPINE WITHOUT CONTRAST TECHNIQUE: Multidetector CT imaging of the head, cervical spine, and maxillofacial structures were performed using the standard protocol without intravenous contrast. Multiplanar CT image reconstructions of the cervical spine and maxillofacial structures were also generated. COMPARISON:  CT of the head, maxillofacial and cervical spine 07/16/2015. FINDINGS: CT HEAD FINDINGS Mild cerebral atrophy. Patchy and confluent areas of decreased attenuation are noted throughout the deep and periventricular white matter of the cerebral hemispheres bilaterally, compatible with chronic microvascular ischemic disease. No acute displaced skull fractures are identified. No acute intracranial abnormality. Specifically, no evidence of acute post-traumatic intracranial hemorrhage, no definite regions of  acute/subacute cerebral ischemia, no focal mass, mass effect, hydrocephalus or abnormal intra or extra-axial fluid collections. The visualized paranasal sinuses and mastoids are well pneumatized. CT MAXILLOFACIAL FINDINGS No acute displaced facial bone fractures. Specifically, pterygoid plates are intact. Additionally, the mandible is intact, and the mandibular condyles are located bilaterally. Bilateral globes and retro-orbital soft tissues are grossly normal in appearance. CT CERVICAL SPINE FINDINGS No acute displaced cervical spine fracture. Alignment is anatomic. Prevertebral soft tissues are normal. Multilevel degenerative disc disease, most evident at C6-C7. Severe multilevel facet arthropathy. Visualized portions of the upper thorax are remarkable. IMPRESSION: 1. No evidence of significant acute traumatic injury to the skull, facial bones, cervical spine or brain. 2. Mild cerebral atrophy with extensive chronic microvascular ischemic changes throughout the cerebral white matter, similar to prior examinations, as above. 3. Multilevel degenerative disc disease and cervical spondylosis, as above. Electronically Signed   By: Vinnie Langton M.D.   On: 07/27/2015 13:51   Ct Cervical Spine Wo Contrast  07/27/2015  CLINICAL DATA:  81 year old male with history of trauma from a fall down a flight of steps landing onto is face. Patient is on Coumadin. Confusion. EXAM: CT HEAD WITHOUT CONTRAST CT MAXILLOFACIAL WITHOUT CONTRAST CT CERVICAL SPINE WITHOUT CONTRAST TECHNIQUE: Multidetector CT imaging of the head, cervical spine, and maxillofacial structures were performed using the standard protocol without intravenous contrast. Multiplanar CT image reconstructions of the cervical spine and maxillofacial structures were also generated. COMPARISON:  CT of the head, maxillofacial and cervical spine 07/16/2015. FINDINGS: CT HEAD FINDINGS Mild cerebral atrophy. Patchy and confluent areas of decreased attenuation are noted  throughout the deep and periventricular white matter of the cerebral hemispheres bilaterally, compatible with chronic microvascular ischemic disease. No acute displaced skull fractures are identified. No acute intracranial abnormality. Specifically, no evidence of acute post-traumatic intracranial hemorrhage, no definite regions of acute/subacute cerebral ischemia, no focal mass, mass effect, hydrocephalus or abnormal intra or extra-axial fluid collections. The visualized paranasal sinuses and mastoids are well pneumatized. CT MAXILLOFACIAL FINDINGS No acute displaced facial bone fractures. Specifically, pterygoid plates are intact. Additionally, the mandible is intact, and the mandibular condyles are located bilaterally. Bilateral globes and retro-orbital soft tissues are grossly normal in appearance. CT CERVICAL SPINE FINDINGS No acute displaced cervical spine fracture. Alignment is anatomic. Prevertebral soft tissues are normal. Multilevel degenerative disc disease, most evident at C6-C7. Severe multilevel facet arthropathy. Visualized portions of the upper thorax are remarkable. IMPRESSION: 1. No evidence of significant acute traumatic injury to  the skull, facial bones, cervical spine or brain. 2. Mild cerebral atrophy with extensive chronic microvascular ischemic changes throughout the cerebral white matter, similar to prior examinations, as above. 3. Multilevel degenerative disc disease and cervical spondylosis, as above. Electronically Signed   By: Vinnie Langton M.D.   On: 07/27/2015 13:51   Ct Maxillofacial Wo Cm  07/27/2015  CLINICAL DATA:  81 year old male with history of trauma from a fall down a flight of steps landing onto is face. Patient is on Coumadin. Confusion. EXAM: CT HEAD WITHOUT CONTRAST CT MAXILLOFACIAL WITHOUT CONTRAST CT CERVICAL SPINE WITHOUT CONTRAST TECHNIQUE: Multidetector CT imaging of the head, cervical spine, and maxillofacial structures were performed using the standard  protocol without intravenous contrast. Multiplanar CT image reconstructions of the cervical spine and maxillofacial structures were also generated. COMPARISON:  CT of the head, maxillofacial and cervical spine 07/16/2015. FINDINGS: CT HEAD FINDINGS Mild cerebral atrophy. Patchy and confluent areas of decreased attenuation are noted throughout the deep and periventricular white matter of the cerebral hemispheres bilaterally, compatible with chronic microvascular ischemic disease. No acute displaced skull fractures are identified. No acute intracranial abnormality. Specifically, no evidence of acute post-traumatic intracranial hemorrhage, no definite regions of acute/subacute cerebral ischemia, no focal mass, mass effect, hydrocephalus or abnormal intra or extra-axial fluid collections. The visualized paranasal sinuses and mastoids are well pneumatized. CT MAXILLOFACIAL FINDINGS No acute displaced facial bone fractures. Specifically, pterygoid plates are intact. Additionally, the mandible is intact, and the mandibular condyles are located bilaterally. Bilateral globes and retro-orbital soft tissues are grossly normal in appearance. CT CERVICAL SPINE FINDINGS No acute displaced cervical spine fracture. Alignment is anatomic. Prevertebral soft tissues are normal. Multilevel degenerative disc disease, most evident at C6-C7. Severe multilevel facet arthropathy. Visualized portions of the upper thorax are remarkable. IMPRESSION: 1. No evidence of significant acute traumatic injury to the skull, facial bones, cervical spine or brain. 2. Mild cerebral atrophy with extensive chronic microvascular ischemic changes throughout the cerebral white matter, similar to prior examinations, as above. 3. Multilevel degenerative disc disease and cervical spondylosis, as above. Electronically Signed   By: Vinnie Langton M.D.   On: 07/27/2015 13:51   I have personally reviewed and evaluated these images and lab results as part of my  medical decision-making.   EKG Interpretation None      MDM   Final diagnoses:  Fall, initial encounter  Facial bruising, initial encounter    Montrell Cessna presents with facial bruising from a stumble and fall.  Findings and plan of care discussed with Davonna Belling, MD.  Based on history and patient presentation, believe this was a mechanical fall. Imaging ordered due to patient's daughter's account of the fall, facial bruising, and patient's anticoagulation. Patient complaining of no other pain and no pain or tenderness found in trauma assessment. If CT scans and labs are clear, will discharge patient into care daughter. CBC reveals no significant acute abnormalities. 1:55 PM CT scans show no acute injury or abnormality. Patient and patient's daughter at the bedside updated on CT scan results and agree with the discharge plan and are comfortable with discharge.    Lorayne Bender, PA-C 07/27/15 [place], MD 07/28/15 [id number]
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
## 2015-07-28 ENCOUNTER — Encounter: Payer: Self-pay | Admitting: Internal Medicine

## 2015-07-28 ENCOUNTER — Ambulatory Visit: Payer: Commercial Managed Care - HMO | Admitting: Physical Therapy

## 2015-07-28 ENCOUNTER — Ambulatory Visit (INDEPENDENT_AMBULATORY_CARE_PROVIDER_SITE_OTHER): Payer: Commercial Managed Care - HMO | Admitting: Internal Medicine

## 2015-07-28 ENCOUNTER — Ambulatory Visit (HOSPITAL_COMMUNITY)
Admission: RE | Admit: 2015-07-28 | Discharge: 2015-07-28 | Disposition: A | Discharge status: Home / Self Care | Payer: Commercial Managed Care - HMO | Source: Ambulatory Visit | Attending: Internal Medicine | Admitting: Internal Medicine

## 2015-07-28 VITALS — BP 154/95 | HR 70 | Temp 98.0°F | Ht 67.0 in | Wt 155.0 lb

## 2015-07-28 DIAGNOSIS — X58XXXA Exposure to other specified factors, initial encounter: Secondary | ICD-10-CM | POA: Diagnosis not present

## 2015-07-28 DIAGNOSIS — Y92008 Other place in unspecified non-institutional (private) residence as the place of occurrence of the external cause: Secondary | ICD-10-CM

## 2015-07-28 DIAGNOSIS — Z23 Encounter for immunization: Secondary | ICD-10-CM

## 2015-07-28 DIAGNOSIS — S6991XA Unspecified injury of right wrist, hand and finger(s), initial encounter: Secondary | ICD-10-CM

## 2015-07-28 DIAGNOSIS — Z0000 Encounter for general adult medical examination without abnormal findings: Secondary | ICD-10-CM

## 2015-07-28 DIAGNOSIS — W109XXA Fall (on) (from) unspecified stairs and steps, initial encounter: Secondary | ICD-10-CM | POA: Diagnosis not present

## 2015-07-28 DIAGNOSIS — M79641 Pain in right hand: Secondary | ICD-10-CM | POA: Diagnosis present

## 2015-07-28 DIAGNOSIS — S62616A Displaced fracture of proximal phalanx of right little finger, initial encounter for closed fracture: Secondary | ICD-10-CM | POA: Insufficient documentation

## 2015-07-28 DIAGNOSIS — S6990XA Unspecified injury of unspecified wrist, hand and finger(s), initial encounter: Secondary | ICD-10-CM | POA: Insufficient documentation

## 2015-07-28 NOTE — Assessment & Plan Note (Addendum)
Patient was seen in the ED yesterday after a fall down front porch steps. He had hit is head and was complaining of headache. ED notes indicate patient has Parkinson's disorder; however, I do not see this in prior problem lists. He does follow with physical therapy. Laboratory work up was normal. CT head, maxillofacial and cervical spine were without acute injury. Physical exam showed facial bruising under right eye and right forehead without crepitus or instability. Patient was discharged home.  Today, patient presents with acute complaint of right 5th digit pain and swelling since the fall yesterday. Pain is getting worse and the finger is more swollen today. It is flexed, but he is able to move it, normal sensation. Able to make a fist. Normal capillary refill, normal radial pulse. Patient resists extension. He is unable to localize the pain in the finger. Differential includes fracture, dislocation, or sprain.   Plan: -Xray right hand complete -Buddy tape placed, but patient will need follow up with orthopedics for splint placement versus further management based on extent of injury  Addendum: -Official read is not yet back for right hand xray, but there appears to be a proximal right 5th digit fracture with mild displacement -[place]- appointment made at 2:15 pm  Addendum: -Right hand xray showed angulated comminuted fracture of the proximal phalanx the right fifth digit. Diffuse degenerative change.

## 2015-07-28 NOTE — Patient Instructions (Signed)
CONTINUE TYLENOL FOR PAIN.  WE WILL OBTAIN AN XRAY. RETURN TO CLINIC AFTER THE XRAY FOR ADDITIONAL SPLINTING AND RESULTS.

## 2015-07-28 NOTE — Addendum Note (Signed)
Addended by: Vickii Chafe on: 07/28/2015 02:53 PM   Modules accepted: Orders

## 2015-07-28 NOTE — Progress Notes (Signed)
Subjective:    Patient ID: [name] [name], male    DOB: 15-Jun-1931, 81 y.o.   MRN: [id number]  HPI [name] [name] is a 81 y.o. male with PMHx of PE, HTN, HLD who presents to the clinic for a right swollen and painful finger. Please see A&P for the status of the patient's chronic medical problems.    Patient was seen in the ED yesterday after a fall down front porch steps. He had hit is head and was complaining of headache. ED notes indicate patient has Parkinson's disorder; however, I do not see this in prior problem lists. He does follow with physical therapy. Laboratory work up was normal. CT head, maxillofacial and cervical spine were without acute injury. Physical exam showed facial bruising under right eye and right forehead without crepitus or instability. Patient was discharged home.  Today, patient presents with acute complaint of right 5th digit pain and swelling since the fall yesterday. Pain is getting worse and the finger is more swollen today. It is flexed, but he is able to move it, normal sensation. Able to make a fist. Normal capillary refill, normal radial pulse. Patient resists extension. He is unable to localize the pain in the finger.   Past Medical History  Diagnosis Date  . PE (pulmonary embolism)     unprovoked PE completed 6 months of warfarin, warfarin d/ced 02/12/2010, repeat PE 07/10/2010 after a long car ride and now on lifelong coumadin.  [name] Hypertension   . Pituitary microadenoma ([place])     incidental finding CT 12/2009  . Prostate cancer ([place])   . Depression   . Hyperlipidemia   . Coronary artery disease, non-occlusive     with history of MI; Cath 2008 w multivessel nonobstructive CAD  . Dementia   . GERD (gastroesophageal reflux disease)   . Scoliosis   . Anxiety     Outpatient Encounter Prescriptions as of 07/28/2015  Medication Sig  . acetaminophen (TYLENOL) 500 MG tablet Take 500 mg by mouth every 4 (four) hours as needed for mild pain or headache.  [name]  amLODipine (NORVASC) 2.5 MG tablet TAKE 1 TABLET (2.5 MG TOTAL) BY MOUTH DAILY.  [name] aspirin EC 81 MG EC tablet Take 1 tablet (81 mg total) by mouth daily.  [name] atorvastatin (LIPITOR) 40 MG tablet TAKE 1 TABLET (40 MG TOTAL) BY MOUTH EVERY EVENING.  [name] b complex vitamins tablet Take 1 tablet by mouth daily.  . Calcium Carbonate-Vitamin D (CALCIUM-VITAMIN D) 500-200 MG-UNIT per tablet Take 1 tablet by mouth daily.  . carbidopa-levodopa (SINEMET IR) 25-100 MG per tablet Take 1 tablet by mouth 2 (two) times daily.  . fish oil-omega-3 fatty acids 1000 MG capsule Take 2 g by mouth daily.   . furosemide (LASIX) 20 MG tablet Take 1 tablet (20 mg total) by mouth daily.  . memantine (NAMENDA) 10 MG tablet Take 1 tablet (10 mg total) by mouth 2 (two) times daily.  . Multiple Vitamins-Minerals (MULTIVITAMIN WITH MINERALS) tablet Take 1 tablet by mouth daily.  [name] omeprazole (PRILOSEC) 20 MG capsule TAKE 1 CAPSULE (20 MG TOTAL) BY MOUTH DAILY.  [name] potassium chloride (K-DUR) 10 MEQ tablet Take 1 tablet (10 mEq total) by mouth as directed. 1st dose take 2 tabs = 20 meq then decrease to 1 tab = 10 meq daily  . vitamin C (ASCORBIC ACID) 500 MG tablet Take 500 mg by mouth daily.  [name] warfarin (COUMADIN) 5 MG tablet Take as directed. (Patient taking differently: Take 5-7.5 mg by  mouth daily. 7.5mg  on Monday and 5 mg all other days)   No facility-administered encounter medications on file as of 07/28/2015.    Family History  Problem Relation Age of Onset  . Hypertension Father     Passed away from cerebral hemorrhage at age of 1.  [name] Dementia Mother     Passed away  . Hypertension Child     4 adult children  . Cervical cancer Other   . Lung cancer Other   . Stroke Other   . Heart attack Neg Hx     Social History   Social History  . Marital Status: Married    Spouse Name: N/A  . Number of Children: 4  . Years of Education: master's   Occupational History  . Retired     retired   Social History Main  Topics  . Smoking status: Never Smoker   . Smokeless tobacco: Never Used  . Alcohol Use: No  . Drug Use: No  . Sexual Activity: Yes   Other Topics Concern  . Not on file   Social History Narrative   Lives with daughter. Wife also has severe dementia.    Drinks 1 cup of coffee a day    Review of Systems General: Denies fever, chills Respiratory: Denies SOB Cardiovascular: Denies chest pain and palpitations.  Musculoskeletal: Admits to right 5th digit pain and swelling.  Neurological: Denies dizziness, headaches, weakness, lightheadedness, numbness     Objective:   Physical Exam Filed Vitals:   07/28/15 1007  BP: 154/95  Pulse: 70  Temp: 98 F (36.7 C)  TempSrc: Oral  Height: 5\' 7"  (1.702 m)  Weight: 155 lb (70.308 kg)  SpO2: 99%   General: Vital signs reviewed.  Patient is well-developed and well-nourished, in no acute distress and cooperative with exam.   Cardiovascular: RRR, S1 normal, S2 normal Pulmonary/Chest: Clear to auscultation bilaterally, no wheezes, rales, or rhonchi. Musculoskeletal: Right 5th digit is edematous and flexed. Tender to palpation, but unable to localize pain within the finger. Normal sensation. Good capillary refill. 2+ right radial pulse. Normal flexion, restricted in extension of right 5th digit.      Assessment & Plan:   Please see problem based assessment and plan.

## 2015-07-30 ENCOUNTER — Telehealth: Payer: Self-pay | Admitting: Licensed Clinical Social Worker

## 2015-07-30 ENCOUNTER — Ambulatory Visit: Payer: Commercial Managed Care - HMO | Admitting: Physical Therapy

## 2015-07-30 DIAGNOSIS — W19XXXS Unspecified fall, sequela: Secondary | ICD-10-CM

## 2015-07-30 DIAGNOSIS — M2560 Stiffness of unspecified joint, not elsewhere classified: Secondary | ICD-10-CM

## 2015-07-30 DIAGNOSIS — R2689 Other abnormalities of gait and mobility: Secondary | ICD-10-CM

## 2015-07-30 DIAGNOSIS — R531 Weakness: Secondary | ICD-10-CM

## 2015-07-30 DIAGNOSIS — R269 Unspecified abnormalities of gait and mobility: Secondary | ICD-10-CM | POA: Diagnosis not present

## 2015-07-30 DIAGNOSIS — R29818 Other symptoms and signs involving the nervous system: Secondary | ICD-10-CM | POA: Diagnosis not present

## 2015-07-30 DIAGNOSIS — R2681 Unsteadiness on feet: Secondary | ICD-10-CM | POA: Diagnosis not present

## 2015-07-30 DIAGNOSIS — Z7409 Other reduced mobility: Secondary | ICD-10-CM

## 2015-07-30 DIAGNOSIS — M623 Immobility syndrome (paraplegic): Secondary | ICD-10-CM | POA: Diagnosis not present

## 2015-07-30 NOTE — Telephone Encounter (Signed)
CSW received voicemail from pt's daughter inquiring if [name] [name] insurance would cover CNA on limited basis due to finger injury.  When CSW attempted to return call, unable to leave message due to voicemail being full.

## 2015-07-31 ENCOUNTER — Encounter: Payer: Self-pay | Admitting: Physical Therapy

## 2015-07-31 NOTE — Therapy (Signed)
[facility] [address] [place], [place], [address] Phone: [phone]   Fax:  [phone]  Physical Therapy Treatment  Patient Details  Name: [name] [name] MRN: [id number] Date of Birth: 08/19/1931 No Data Recorded  Encounter Date: 07/30/2015      PT End of Session - 07/30/15 1318    Visit Number 10   Number of Visits 17   Date for PT Re-Evaluation 08/10/15   Authorization Type G-code every 10th visit   PT Start Time 1330   PT Stop Time 1400   PT Time Calculation (min) 30 min   Equipment Utilized During Treatment Gait belt   Activity Tolerance Patient tolerated treatment well   Behavior During Therapy [facility] for tasks assessed/performed      Past Medical History  Diagnosis Date  . PE (pulmonary embolism)     unprovoked PE completed 6 months of warfarin, warfarin d/ced 02/12/2010, repeat PE 07/10/2010 after a long car ride and now on lifelong coumadin.  [name] Hypertension   . Pituitary microadenoma ([place])     incidental finding CT 12/2009  . Prostate cancer ([place])   . Depression   . Hyperlipidemia   . Coronary artery disease, non-occlusive     with history of MI; Cath 2008 w multivessel nonobstructive CAD  . Dementia   . GERD (gastroesophageal reflux disease)   . Scoliosis   . Anxiety     Past Surgical History  Procedure Laterality Date  . Prostatectomy    . Left heart catheterization with coronary angiogram N/A 12/10/2014    Procedure: LEFT HEART CATHETERIZATION WITH CORONARY ANGIOGRAM;  Surgeon: [name], MD;  Location: [facility] CATH LAB;  Service: Cardiovascular;  Laterality: N/A;  . Loop recorder implant N/A 12/16/2014    Procedure: LOOP RECORDER IMPLANT;  Surgeon: [name], MD;  Location: [facility] CATH LAB;  Service: Cardiovascular;  Laterality: N/A;    There were no vitals filed for this visit.  Visit Diagnosis:  Unsteadiness  Weakness generalized  Balance problems  Abnormality of gait  Falls,  sequela  Stiffness due to immobility      Subjective Assessment - 07/30/15 1332    Subjective Pt now with short arm cast on right forerm from a 5th digit break. this is a result from a recent fall. Now having trouble holding onto walker handle.   Currently in Pain? No/denies   Pain Score 0-No pain           [facility] Adult PT Treatment/Exercise - 07/31/15 1006    Transfers   Sit to Stand 4: Min guard;With upper extremity assist;With armrests;From bed;From chair/3-in-1   Sit to Stand Details Verbal cues for sequencing;Verbal cues for technique;Verbal cues for precautions/safety;Verbal cues for safe use of DME/AE   Five time sit to stand comments  25.60 sec's to complete this task   Stand to Sit 4: Min guard;With upper extremity assist;With armrests;To bed;To chair/3-in-1   Stand to Sit Details (indicate cue type and reason) Visual cues for safe use of DME/AE;Visual cues/gestures for precautions/safety;Verbal cues for precautions/safety;Verbal cues for gait pattern;Verbal cues for safe use of DME/AE;Verbal cues for sequencing;Verbal cues for technique   Ambulation/Gait   Ambulation/Gait Yes   Ambulation/Gait Assistance 4: Min guard;5: Supervision   Ambulation Distance (Feet) 240 Feet   Assistive device Rolling walker   Gait Pattern Step-through pattern;Decreased stride length;Shuffle   Gait velocity 19.03 sec= 1.72 ft/sec with walker   Berg Balance Test   Sit to Stand Able to stand  independently using hands   Standing Unsupported Able to stand 2 minutes with supervision   Sitting with Back Unsupported but Feet Supported on Floor or Stool Able to sit safely and securely 2 minutes   Stand to Sit Controls descent by using hands   Transfers Able to transfer safely, minor use of hands   Standing Unsupported with Eyes Closed Able to stand 10 seconds with supervision   Standing Ubsupported with Feet Together Able to place feet together independently and stand for 1 minute with supervision    From Standing, Reach Forward with Outstretched Arm Can reach forward >5 cm safely (2")  3 inches   From Standing Position, Pick up Object from Floor Able to pick up shoe, needs supervision   From Standing Position, Turn to Look Behind Over each Shoulder Needs supervision when turning   Turn 360 Degrees Needs close supervision or verbal cueing   Standing Unsupported, Alternately Place Feet on Step/Stool Able to complete >2 steps/needs minimal assist   Standing Unsupported, One Foot in [employer] balance while stepping or standing   Standing on One Leg Unable to try or needs assist to prevent fall   Total Score 31   Timed Up and Go Test   Normal TUG (seconds) 60.12  with RW             PT Short Term Goals - 07/23/15 1623    PT SHORT TERM GOAL #1   Title Patient verbalizes fall prevention strategies. (Target Date: 07/11/2015)   Baseline Achieved 07/23/15   Status Achieved   PT SHORT TERM GOAL #2   Title Patient demonstrates understanding of [place] exercise program. (Target Date: 07/11/2015)   Baseline met on 07/09/15   Status Achieved   PT SHORT TERM GOAL #3   Title Patient ambulates with rollator walker 150' with supervision to initiate safer community mobility. (Target Date: 07/11/2015)   Baseline 07/23/15: pt not at supervision with community mobility.   Status Not Met           PT Long Term Goals - 07/31/15 1007    PT LONG TERM GOAL #1   Title Patient & daughter demonstrate / verbalioize understanding of ongoing HEP / fitness plan. (Target Date: 08/08/2015)   Status Achieved   PT LONG TERM GOAL #2   Title Five times sit to stand <15 sec (Target Date: 08/08/2015)   Baseline 07/31/15: 25.60 seconds   Status Not Met   PT LONG TERM GOAL #3   Title Patient ambulates 250' with LRAD with SBA safely. (Target Date: 08/08/2015)   Baseline 07/31/15: pt continues to need min assist wtih intermittent bouts of min guard assist/supervison to ambulate safely with or without AD.    Status Not Met   PT LONG TERM GOAL #4   Title Patient negotiates ramps, curbs and stairs with LRAD with supervision. (Target Date: 08/08/2015)   Baseline 07/31/15: requires assistance to complete these safely   Status Not Met   PT LONG TERM GOAL #5   Title Patient ambulates around furniture carrying cup of water with LRAD modified independent. (Target Date: 08/08/2015)   Baseline 07/31/15: pt needs assistnace to complete this safely   Status Not Met           Plan - 07/30/15 1330    Clinical Impression Statement Pt continues to have falls at home when unsupervised. Pt's daughter is persuing 24 hour assistance to help her care for pt and his spouse. At this time the daughter feels she  has been given the needed education and tools to manage at home and is requesting discharge. Goals checked and education reinforced. Daughter without questions.                                                Pt will benefit from skilled therapeutic intervention in order to improve on the following deficits Abnormal gait;Decreased activity tolerance;Decreased balance;Decreased endurance;Decreased mobility;Decreased safety awareness;Decreased range of motion;Decreased strength;Impaired flexibility   Rehab Potential Good   PT Frequency 2x / week   PT Duration 8 weeks   PT Treatment/Interventions ADLs/Self Care Home Management;DME Instruction;Gait training;Stair training;Functional mobility training;Therapeutic activities;Therapeutic exercise;Balance training;Neuromuscular re-education;Patient/family education   PT Next Visit Plan discharge at this time   PT Home Exercise Plan 10/5: completed instruction of [facility]   Consulted and Agree with Plan of Care Patient;Family member/caregiver   Family Member Consulted daughter, Osvaldo Human - 08-20-2015 1009    Functional Assessment Tool Used Berg Balance test 31/56, Timed up and Go 60.12 seconds with RW      Problem List Patient Active Problem List    Diagnosis Date Noted  . Finger injury 07/28/2015  . Leukopenia 04/16/2015  . Normocytic anemia 04/16/2015  . Chronic systolic heart failure ([place]) 04/16/2015  . Falls frequently 04/16/2015  . Onychomycosis 04/15/2015  . CAD (coronary artery disease) 01/16/2015  . Faintness   . 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 Syncope 12/07/2014  . Healthcare maintenance 10/10/2014  . Somnolence, daytime 10/01/2014  . Acute pain of right hip 02/13/2014  . Lumbosacral spondylosis without myelopathy 09/24/2013  . Symptomatic bradycardia 02/20/2013  . Generalized ischemic cerebrovascular disease 02/05/2013  . Depression 01/25/2013  . Penile bleeding 01/19/2013  . Constipation 01/20/2012  . Insomnia 01/20/2012  . Alzheimer's dementia 08/17/2011  . Pulmonary embolism ([place]) 09/18/2010  . Encounter for long-term use of antiplatelets/antithrombotics 09/18/2010  . DJD (degenerative joint disease) 08/06/2010  . ADENOCARCINOMA, PROSTATE, HX OF 08/06/2010  . HLD (hyperlipidemia) 10/15/2009  . Essential hypertension, benign 10/15/2009  . GERD 10/15/2009    Willow Ora 08/20/15, 10:11 AM  Willow Ora, PTA, [facility] [address] [place], [place] [address] [phone] 08-20-15, 10:11 AM   Name: Detrick Dani MRN: [id number] Date of Birth: 05/18/1931

## 2015-07-31 NOTE — Therapy (Signed)
[facility] [address] [place], [place], [address] Phone: [phone]   Fax:  [phone]  Patient Details  Name: [name] [name] MRN: [id number] Date of Birth: October 15, 1930 Referring Provider:  No ref. provider found  Encounter Date: 07/31/2015  PHYSICAL THERAPY DISCHARGE SUMMARY  Visits from Start of Care: 10  Current functional level related to goals / functional outcomes:     PT Long Term Goals - 07/31/15 1007    PT LONG TERM GOAL #1   Title Patient & daughter demonstrate / verbalioize understanding of ongoing HEP / fitness plan. (Target Date: 08/08/2015)   Status Achieved   PT LONG TERM GOAL #2   Title Five times sit to stand <15 sec (Target Date: 08/08/2015)   Baseline 07/31/15: 25.60 seconds   Status Not Met   PT LONG TERM GOAL #3   Title Patient ambulates 250' with LRAD with SBA safely. (Target Date: 08/08/2015)   Baseline 07/31/15: pt continues to need min assist wtih intermittent bouts of min guard assist/supervison to ambulate safely with or without AD.   Status Not Met   PT LONG TERM GOAL #4   Title Patient negotiates ramps, curbs and stairs with LRAD with supervision. (Target Date: 08/08/2015)   Baseline 07/31/15: requires assistance to complete these safely   Status Not Met   PT LONG TERM GOAL #5   Title Patient ambulates around furniture carrying cup of water with LRAD modified independent. (Target Date: 08/08/2015)   Baseline 07/31/15: pt needs assistnace to complete this safely   Status Not Met       Remaining deficits: Patient continues to have high fall risk. He needs assistance for gait with recommendation to use a rolling walker. Patient continues to have dementia with negatively impacts safety. Patient's daughter verbalizes understanding of recommendations including use of a rolling walker.   Education / Equipment: HEP of [place]: Patient agrees to discharge.  Patient goals were not  met. Patient is being discharged due to lack of progress.  ?????       Martasia Talamante PT, DPT 07/31/2015, 8:31 PM  [facility] [address] [place], [place], [address] Phone: [phone]   Fax:  [phone]

## 2015-08-01 ENCOUNTER — Encounter: Payer: Self-pay | Admitting: Internal Medicine

## 2015-08-01 NOTE — Telephone Encounter (Signed)
CSW placed called to pt.  CSW left message providing information that pt's current insurance does not cover aide services only skilled home health services, ie: RN or PT.  Once but is receiving in-home skilled services then aide can be added as an add on service.  CSW noted pt can not receive outpatient therapy and home health at the same time.    Daughter, [name] [name], returned call to [facility].  Daughter states they have stopped outpatient PT due to difficulty getting [name] [name] in/out of the home.   Daughter requesting [facility] and [facility].  Pt has used [facility] most recently.  CSW notified daughter will request referral for home health services from PCP/or most recent physician pt has seen.  Once referral has been placed, CSW will forward to  [facility].

## 2015-08-04 ENCOUNTER — Other Ambulatory Visit: Payer: Self-pay | Admitting: Internal Medicine

## 2015-08-04 ENCOUNTER — Encounter: Payer: Commercial Managed Care - HMO | Admitting: Physical Therapy

## 2015-08-04 DIAGNOSIS — R296 Repeated falls: Secondary | ICD-10-CM

## 2015-08-04 DIAGNOSIS — G309 Alzheimer's disease, unspecified: Principal | ICD-10-CM

## 2015-08-04 DIAGNOSIS — F0280 Dementia in other diseases classified elsewhere without behavioral disturbance: Secondary | ICD-10-CM

## 2015-08-06 ENCOUNTER — Encounter: Payer: Commercial Managed Care - HMO | Admitting: Physical Therapy

## 2015-08-06 DIAGNOSIS — G309 Alzheimer's disease, unspecified: Secondary | ICD-10-CM | POA: Diagnosis not present

## 2015-08-06 DIAGNOSIS — S6991XD Unspecified injury of right wrist, hand and finger(s), subsequent encounter: Secondary | ICD-10-CM | POA: Diagnosis not present

## 2015-08-06 DIAGNOSIS — R296 Repeated falls: Secondary | ICD-10-CM | POA: Diagnosis not present

## 2015-08-06 DIAGNOSIS — F0281 Dementia in other diseases classified elsewhere with behavioral disturbance: Secondary | ICD-10-CM | POA: Diagnosis not present

## 2015-08-06 DIAGNOSIS — G219 Secondary parkinsonism, unspecified: Secondary | ICD-10-CM | POA: Diagnosis not present

## 2015-08-07 ENCOUNTER — Ambulatory Visit (INDEPENDENT_AMBULATORY_CARE_PROVIDER_SITE_OTHER): Payer: Commercial Managed Care - HMO | Admitting: Podiatry

## 2015-08-07 ENCOUNTER — Encounter: Payer: Self-pay | Admitting: Podiatry

## 2015-08-07 DIAGNOSIS — B351 Tinea unguium: Secondary | ICD-10-CM | POA: Diagnosis not present

## 2015-08-07 DIAGNOSIS — F0281 Dementia in other diseases classified elsewhere with behavioral disturbance: Secondary | ICD-10-CM | POA: Diagnosis not present

## 2015-08-07 DIAGNOSIS — M79606 Pain in leg, unspecified: Secondary | ICD-10-CM

## 2015-08-07 DIAGNOSIS — G219 Secondary parkinsonism, unspecified: Secondary | ICD-10-CM | POA: Diagnosis not present

## 2015-08-07 DIAGNOSIS — S6991XD Unspecified injury of right wrist, hand and finger(s), subsequent encounter: Secondary | ICD-10-CM | POA: Diagnosis not present

## 2015-08-07 DIAGNOSIS — G309 Alzheimer's disease, unspecified: Secondary | ICD-10-CM | POA: Diagnosis not present

## 2015-08-07 DIAGNOSIS — R296 Repeated falls: Secondary | ICD-10-CM | POA: Diagnosis not present

## 2015-08-07 NOTE — Progress Notes (Signed)
Patient ID: [name] [name], male   DOB: [birth year], 81 y.o.   MRN: [id number] Complaint:  Visit Type: Patient returns to my office for continued preventative foot care services. Complaint: Patient states" my nails have grown long and thick and become painful to walk and wear shoes" . The patient presents for preventative foot care services. No changes to ROS  Podiatric Exam: Vascular: dorsalis pedis and posterior tibial pulses are palpable bilateral. Capillary return is immediate. Temperature gradient is WNL. Skin turgor WNL  Sensorium: Normal Semmes Weinstein monofilament test. Normal tactile sensation bilaterally. Nail Exam: Pt has thick disfigured discolored nails with subungual debris noted bilateral entire nail hallux through fifth toenails Ulcer Exam: There is no evidence of ulcer or pre-ulcerative changes or infection. Orthopedic Exam: Muscle tone and strength are WNL. No limitations in general ROM. No crepitus or effusions noted. Foot type and digits show no abnormalities. Bony prominences are unremarkable. Skin: No Porokeratosis. No infection or ulcers  Diagnosis:  Onychomycosis, , Pain in right toe, pain in left toes  Treatment & Plan Procedures and Treatment: Consent by patient was obtained for treatment procedures. The patient understood the discussion of treatment and procedures well. All questions were answered thoroughly reviewed. Debridement of mycotic and hypertrophic toenails, 1 through 5 bilateral and clearing of subungual debris. No ulceration, no infection noted.  Return Visit-Office Procedure: Patient instructed to return to the office for a follow up visit 3 months for continued evaluation and treatment.    Gardiner Barefoot DPM

## 2015-08-08 DIAGNOSIS — R296 Repeated falls: Secondary | ICD-10-CM | POA: Diagnosis not present

## 2015-08-08 DIAGNOSIS — F0281 Dementia in other diseases classified elsewhere with behavioral disturbance: Secondary | ICD-10-CM | POA: Diagnosis not present

## 2015-08-08 DIAGNOSIS — G309 Alzheimer's disease, unspecified: Secondary | ICD-10-CM | POA: Diagnosis not present

## 2015-08-08 DIAGNOSIS — S6991XD Unspecified injury of right wrist, hand and finger(s), subsequent encounter: Secondary | ICD-10-CM | POA: Diagnosis not present

## 2015-08-08 DIAGNOSIS — G219 Secondary parkinsonism, unspecified: Secondary | ICD-10-CM | POA: Diagnosis not present

## 2015-08-11 ENCOUNTER — Ambulatory Visit (INDEPENDENT_AMBULATORY_CARE_PROVIDER_SITE_OTHER): Payer: Commercial Managed Care - HMO | Admitting: Pharmacist

## 2015-08-11 ENCOUNTER — Encounter: Payer: Self-pay | Admitting: Internal Medicine

## 2015-08-11 ENCOUNTER — Ambulatory Visit (INDEPENDENT_AMBULATORY_CARE_PROVIDER_SITE_OTHER): Payer: Commercial Managed Care - HMO | Admitting: Internal Medicine

## 2015-08-11 VITALS — BP 141/90 | HR 70 | Temp 98.2°F | Wt 154.5 lb

## 2015-08-11 DIAGNOSIS — G219 Secondary parkinsonism, unspecified: Secondary | ICD-10-CM | POA: Diagnosis not present

## 2015-08-11 DIAGNOSIS — R296 Repeated falls: Secondary | ICD-10-CM | POA: Diagnosis not present

## 2015-08-11 DIAGNOSIS — S6991XA Unspecified injury of right wrist, hand and finger(s), initial encounter: Secondary | ICD-10-CM

## 2015-08-11 DIAGNOSIS — I2699 Other pulmonary embolism without acute cor pulmonale: Secondary | ICD-10-CM | POA: Diagnosis not present

## 2015-08-11 DIAGNOSIS — Z7902 Long term (current) use of antithrombotics/antiplatelets: Secondary | ICD-10-CM

## 2015-08-11 DIAGNOSIS — G20 Parkinson's disease: Secondary | ICD-10-CM | POA: Insufficient documentation

## 2015-08-11 DIAGNOSIS — G4700 Insomnia, unspecified: Secondary | ICD-10-CM | POA: Diagnosis not present

## 2015-08-11 DIAGNOSIS — W19XXXD Unspecified fall, subsequent encounter: Secondary | ICD-10-CM

## 2015-08-11 DIAGNOSIS — H539 Unspecified visual disturbance: Secondary | ICD-10-CM | POA: Diagnosis not present

## 2015-08-11 DIAGNOSIS — S6991XD Unspecified injury of right wrist, hand and finger(s), subsequent encounter: Secondary | ICD-10-CM | POA: Diagnosis not present

## 2015-08-11 DIAGNOSIS — Z7901 Long term (current) use of anticoagulants: Secondary | ICD-10-CM

## 2015-08-11 DIAGNOSIS — F0281 Dementia in other diseases classified elsewhere with behavioral disturbance: Secondary | ICD-10-CM | POA: Diagnosis not present

## 2015-08-11 DIAGNOSIS — S62606D Fracture of unspecified phalanx of right little finger, subsequent encounter for fracture with routine healing: Secondary | ICD-10-CM | POA: Diagnosis not present

## 2015-08-11 DIAGNOSIS — G309 Alzheimer's disease, unspecified: Secondary | ICD-10-CM | POA: Diagnosis not present

## 2015-08-11 LAB — POCT INR: INR: 2

## 2015-08-11 MED ORDER — TRAZODONE HCL 50 MG PO TABS: 50.0000 mg | ORAL_TABLET | Freq: Every day | ORAL | Status: DC

## 2015-08-11 NOTE — Assessment & Plan Note (Signed)
Patient has a previous diagnosis of Parkinsonian Syndrome with dementia, gait abnormality, unsteadiness and shuffling gait, depression and insomnia. He is currently on Namenda and Sinemet. Trazodone was added today for insomnia and sundowning. Despite PT, patient has had a decline in functional status and increasing difficulty with ADLs. Patient's daughter is doing an excellent job taking care of him, but admittedly is having more difficulty managing his needs and symptoms.   Plan: -Continue above medications -Should have goals of care discussion on follow up visit

## 2015-08-11 NOTE — Assessment & Plan Note (Signed)
Compliant with coumadin. INR check today with Dr. Elie Confer.

## 2015-08-11 NOTE — Assessment & Plan Note (Signed)
Patient's daughter states his insomnia has worsened and he has been sundowning at night. He has been on melatonin in the past 5-10 mg QHS which previously helped, but no longer helps his symptoms. His biggest trouble is staying asleep. Given his Parkinsonian Syndrome, frequent falls, and age, I would favor avoid benzodiazepines and trying Trazodone given his concomitant depression.  Plan: -Trazodone 25 mg QHS, titrate up to 50 mg QHS in 2-3 days -Follow up one month, can continue to titrate up if necessary

## 2015-08-11 NOTE — Patient Instructions (Signed)
Patient instructed to take medications as defined in the Anti-coagulation Track section of this encounter.  Patient instructed to take today's dose.  Patient verbalized understanding of these instructions.    

## 2015-08-11 NOTE — Progress Notes (Signed)
Case discussed with Dr. Richardson at the time of the visit.  We reviewed the resident's history and exam and pertinent patient test results.  I agree with the assessment, diagnosis, and plan of care documented in the resident's note. 

## 2015-08-11 NOTE — Progress Notes (Signed)
Subjective:    Patient ID: [name] [name], male    DOB: October 23, 1930, 81 y.o.   MRN: [id number]  HPI [name] [name] is a 81 y.o. male with PMHx of HTN, PE on chronic coumadin, Depression and GERD who presents to the clinic for insomnia. Please see A&P for the status of the patient's chronic medical problems.   Past Medical History  Diagnosis Date  . PE (pulmonary embolism)     unprovoked PE completed 6 months of warfarin, warfarin d/ced 02/12/2010, repeat PE 07/10/2010 after a long car ride and now on lifelong coumadin.  [name] Hypertension   . Pituitary microadenoma ([place])     incidental finding CT 12/2009  . Prostate cancer ([place])   . Depression   . Hyperlipidemia   . Coronary artery disease, non-occlusive     with history of MI; Cath 2008 w multivessel nonobstructive CAD  . Dementia   . GERD (gastroesophageal reflux disease)   . Scoliosis   . Anxiety     Outpatient Encounter Prescriptions as of 08/11/2015  Medication Sig  . acetaminophen (TYLENOL) 500 MG tablet Take 500 mg by mouth every 4 (four) hours as needed for mild pain or headache.  [name] amLODipine (NORVASC) 2.5 MG tablet TAKE 1 TABLET (2.5 MG TOTAL) BY MOUTH DAILY.  [name] aspirin EC 81 MG EC tablet Take 1 tablet (81 mg total) by mouth daily.  [name] atorvastatin (LIPITOR) 40 MG tablet TAKE 1 TABLET (40 MG TOTAL) BY MOUTH EVERY EVENING.  [name] b complex vitamins tablet Take 1 tablet by mouth daily.  . Calcium Carbonate-Vitamin D (CALCIUM-VITAMIN D) 500-200 MG-UNIT per tablet Take 1 tablet by mouth daily.  . carbidopa-levodopa (SINEMET IR) 25-100 MG per tablet Take 1 tablet by mouth 2 (two) times daily.  . fish oil-omega-3 fatty acids 1000 MG capsule Take 2 g by mouth daily.   . furosemide (LASIX) 20 MG tablet Take 1 tablet (20 mg total) by mouth daily.  . memantine (NAMENDA) 10 MG tablet Take 1 tablet (10 mg total) by mouth 2 (two) times daily.  . Multiple Vitamins-Minerals (MULTIVITAMIN WITH MINERALS) tablet Take 1 tablet by mouth daily.  [name]  omeprazole (PRILOSEC) 20 MG capsule TAKE 1 CAPSULE (20 MG TOTAL) BY MOUTH DAILY.  [name] potassium chloride (K-DUR) 10 MEQ tablet Take 1 tablet (10 mEq total) by mouth as directed. 1st dose take 2 tabs = 20 meq then decrease to 1 tab = 10 meq daily  . traZODone (DESYREL) 50 MG tablet Take 1 tablet (50 mg total) by mouth at bedtime.  . vitamin C (ASCORBIC ACID) 500 MG tablet Take 500 mg by mouth daily.  [name] warfarin (COUMADIN) 5 MG tablet Take as directed. (Patient taking differently: Take 5-7.5 mg by mouth daily. 7.5mg  on Monday and 5 mg all other days)   No facility-administered encounter medications on file as of 08/11/2015.    Family History  Problem Relation Age of Onset  . Hypertension Father     Passed away from cerebral hemorrhage at age of 90.  [name] Dementia Mother     Passed away  . Hypertension Child     4 adult children  . Cervical cancer Other   . Lung cancer Other   . Stroke Other   . Heart attack Neg Hx     Social History   Social History  . Marital Status: Married    Spouse Name: N/A  . Number of Children: 4  . Years of Education: master's   Occupational History  .  Retired     retired   Social History Main Topics  . Smoking status: Never Smoker   . Smokeless tobacco: Never Used  . Alcohol Use: No  . Drug Use: No  . Sexual Activity: Yes   Other Topics Concern  . Not on file   Social History Narrative   Lives with daughter. Wife also has severe dementia.    Drinks 1 cup of coffee a day     Review of Systems General: Admits to fatigue. Denies change in appetite.  Respiratory: Denies SOB, chest tightness.   Cardiovascular: Denies chest pain and palpitations.  Gastrointestinal: Denies diarrhea, constipation.  Musculoskeletal: Admits to right hand pain (improving). Denies myalgias Neurological: Admits to unsteadiness, memory loss, shuffling gait, frequent falls, weakness. Denies dizziness, headaches, lightheadedness, numbness, seizures, and  syncope Psychiatric/Behavioral: Admits to insomnia and sundowning.     Objective:   Physical Exam Filed Vitals:   08/11/15 1332  BP: 141/90  Pulse: 70  Temp: 98.2 F (36.8 C)  TempSrc: Oral  Weight: 154 lb 8 oz (70.081 kg)  SpO2: 97%   General: Vital signs reviewed.  Patient is elderly, chronically ill-appearing, in no acute distress and cooperative with exam.  Eyes: EOMI, conjunctivae normal, no scleral icterus.   Cardiovascular: RRR, S1 normal, S2 normal. Pulmonary/Chest: Clear to auscultation bilaterally, no wheezes, rales, or rhonchi. Musculoskeletal: Right hand and forearm in hard cast. Able to move fingers. Normal capillary refill and sensation.  Extremities: No lower extremity edema bilaterally Psychiatric: Normal mood and affect. Cognition and memory are abnormal.      Assessment & Plan:   Please see problem based assessment and plan.

## 2015-08-11 NOTE — Patient Instructions (Signed)
FOR INSOMNIA: START TRAZODONE 25 MG (1/2 TABLET AT NIGHT) CAN INCREASE TO WHOLE TABLET IN 2-3 DAYS IF TOLERATING  WE HAVE PLACED A REFERRAL TO DR. SHAPIRO'S OFFICE  CONTINUE ALL OTHER MEDICATIONS THE SAME

## 2015-08-11 NOTE — Progress Notes (Signed)
Anti-Coagulation Progress Note  [name] [name] is a 81 y.o. male who is currently on an anti-coagulation regimen.    RECENT RESULTS: Recent results are below, the most recent result is correlated with a dose of 37.5 mg. per week: Lab Results  Component Value Date   INR 2.00 08/11/2015   INR 2.05* 07/27/2015   INR 2.69* 07/16/2015    ANTI-COAG DOSE: Anticoagulation Dose Instructions as of 08/11/2015      [name] Tue Wed Thu Fri Sat   New Dose 5 mg 7.5 mg 5 mg 5 mg 7.5 mg 5 mg 5 mg       ANTICOAG SUMMARY: Anticoagulation Episode Summary    Current INR goal 2.0-3.0  Next INR check 09/08/2015  INR from last check 2.00 (08/11/2015)  Weekly max dose   Target end date Indefinite  [facility]  Preferred lab   Send INR reminders to ANTICOAG IMP   Indications  Pulmonary embolism ([place]) [I26.99] Encounter for long-term use of antiplatelets/antithrombotics [Z79.02]        Comments History of multiple venous embolic episodes. Will continue to annual re-evaluate continued need for warfarin weighing risks vs. benefits.       Anticoagulation Care Providers    Provider Role Specialty Phone number   Bertha Stakes, MD  Internal Medicine [phone]      ANTICOAG TODAY: Anticoagulation Summary as of 08/11/2015    INR goal 2.0-3.0  Selected INR 2.00 (08/11/2015)  Next INR check 09/08/2015  Target end date Indefinite   Indications  Pulmonary embolism ([place]) [I26.99] Encounter for long-term use of antiplatelets/antithrombotics [Z79.02]      Anticoagulation Episode Summary    IN[facility]   Preferred lab    Send INR reminders to ANTICOAG IMP   Comments History of multiple venous embolic episodes. Will continue to annual re-evaluate continued need for warfarin weighing risks vs. benefits.     Anticoagulation Care Providers    Provider Role Specialty Phone number   Bertha Stakes, MD  Internal Medicine [phone]      PATIENT  INSTRUCTIONS: Patient Instructions  Patient instructed to take medications as defined in the Anti-coagulation Track section of this encounter.  Patient instructed to take today's dose.  Patient verbalized understanding of these instructions.       FOLLOW-UP Return in 4 weeks (on 09/08/2015) for Follow up INR at [place], [name] Pharm.D., CACP

## 2015-08-11 NOTE — Assessment & Plan Note (Signed)
Patient was seen on 07/28/15 after a fall resulting in a comminuted fracture of his right 5th digit. An urgent referral to orthopedic surgery was placed. Patient now has a hard cast in place for another 4.5 weeks for a total of 6.5 weeks. He has normal movement, sensation and capillary refill of his fingers.   Plan: -Follow up with orthopedic surgery

## 2015-08-11 NOTE — Assessment & Plan Note (Signed)
Patient's daughter reports vision changes and possible cataracts. PT was questioning if balance and coordination problems could be in part due to vision problems. He has seen Dr. Gershon Crane in the past, but it has been several years.  Plan: -Referral back to Ophthalmology with Dr. Gershon Crane

## 2015-08-12 DIAGNOSIS — S6991XD Unspecified injury of right wrist, hand and finger(s), subsequent encounter: Secondary | ICD-10-CM | POA: Diagnosis not present

## 2015-08-12 DIAGNOSIS — G309 Alzheimer's disease, unspecified: Secondary | ICD-10-CM | POA: Diagnosis not present

## 2015-08-12 DIAGNOSIS — R296 Repeated falls: Secondary | ICD-10-CM | POA: Diagnosis not present

## 2015-08-12 DIAGNOSIS — F0281 Dementia in other diseases classified elsewhere with behavioral disturbance: Secondary | ICD-10-CM | POA: Diagnosis not present

## 2015-08-12 DIAGNOSIS — G219 Secondary parkinsonism, unspecified: Secondary | ICD-10-CM | POA: Diagnosis not present

## 2015-08-12 NOTE — Progress Notes (Signed)
Internal Medicine Clinic Attending  Case discussed with Dr. Richardson soon after the resident saw the patient.  We reviewed the resident's history and exam and pertinent patient test results.  I agree with the assessment, diagnosis, and plan of care documented in the resident's note. 

## 2015-08-13 ENCOUNTER — Ambulatory Visit: Payer: Commercial Managed Care - HMO | Admitting: *Deleted

## 2015-08-13 ENCOUNTER — Ambulatory Visit (INDEPENDENT_AMBULATORY_CARE_PROVIDER_SITE_OTHER): Payer: Commercial Managed Care - HMO | Admitting: *Deleted

## 2015-08-13 DIAGNOSIS — S6991XD Unspecified injury of right wrist, hand and finger(s), subsequent encounter: Secondary | ICD-10-CM | POA: Diagnosis not present

## 2015-08-13 DIAGNOSIS — R55 Syncope and collapse: Secondary | ICD-10-CM

## 2015-08-13 DIAGNOSIS — F0281 Dementia in other diseases classified elsewhere with behavioral disturbance: Secondary | ICD-10-CM | POA: Diagnosis not present

## 2015-08-13 DIAGNOSIS — G219 Secondary parkinsonism, unspecified: Secondary | ICD-10-CM | POA: Diagnosis not present

## 2015-08-13 DIAGNOSIS — R296 Repeated falls: Secondary | ICD-10-CM | POA: Diagnosis not present

## 2015-08-13 DIAGNOSIS — G309 Alzheimer's disease, unspecified: Secondary | ICD-10-CM | POA: Diagnosis not present

## 2015-08-14 LAB — CUP PACEART REMOTE DEVICE CHECK: MDC IDC SESS DTM: 20161124000714

## 2015-08-15 DIAGNOSIS — S6991XD Unspecified injury of right wrist, hand and finger(s), subsequent encounter: Secondary | ICD-10-CM | POA: Diagnosis not present

## 2015-08-15 DIAGNOSIS — F0281 Dementia in other diseases classified elsewhere with behavioral disturbance: Secondary | ICD-10-CM | POA: Diagnosis not present

## 2015-08-15 DIAGNOSIS — R296 Repeated falls: Secondary | ICD-10-CM | POA: Diagnosis not present

## 2015-08-15 DIAGNOSIS — G219 Secondary parkinsonism, unspecified: Secondary | ICD-10-CM | POA: Diagnosis not present

## 2015-08-15 DIAGNOSIS — G309 Alzheimer's disease, unspecified: Secondary | ICD-10-CM | POA: Diagnosis not present

## 2015-08-15 LAB — CUP PACEART REMOTE DEVICE CHECK: Date Time Interrogation Session: 20161024233658

## 2015-08-15 NOTE — Progress Notes (Signed)
Carelink summary report received. Battery status OK. Normal device function. No tachy, brady, pause, or AF episodes. 1 symptom episode--SR w/PVCs, patient asymptomatic, was showing daughter how to use symptom activator during appointment with SK. Monthly summary reports and ROV with SK in 06/2016.

## 2015-08-16 DIAGNOSIS — G309 Alzheimer's disease, unspecified: Secondary | ICD-10-CM | POA: Diagnosis not present

## 2015-08-16 DIAGNOSIS — G219 Secondary parkinsonism, unspecified: Secondary | ICD-10-CM | POA: Diagnosis not present

## 2015-08-16 DIAGNOSIS — S6991XD Unspecified injury of right wrist, hand and finger(s), subsequent encounter: Secondary | ICD-10-CM | POA: Diagnosis not present

## 2015-08-16 DIAGNOSIS — R296 Repeated falls: Secondary | ICD-10-CM | POA: Diagnosis not present

## 2015-08-16 DIAGNOSIS — F0281 Dementia in other diseases classified elsewhere with behavioral disturbance: Secondary | ICD-10-CM | POA: Diagnosis not present

## 2015-08-18 DIAGNOSIS — G309 Alzheimer's disease, unspecified: Secondary | ICD-10-CM | POA: Diagnosis not present

## 2015-08-18 DIAGNOSIS — S6991XD Unspecified injury of right wrist, hand and finger(s), subsequent encounter: Secondary | ICD-10-CM | POA: Diagnosis not present

## 2015-08-18 DIAGNOSIS — S62616D Displaced fracture of proximal phalanx of right little finger, subsequent encounter for fracture with routine healing: Secondary | ICD-10-CM | POA: Diagnosis not present

## 2015-08-18 DIAGNOSIS — R296 Repeated falls: Secondary | ICD-10-CM | POA: Diagnosis not present

## 2015-08-18 DIAGNOSIS — F0281 Dementia in other diseases classified elsewhere with behavioral disturbance: Secondary | ICD-10-CM | POA: Diagnosis not present

## 2015-08-18 DIAGNOSIS — G219 Secondary parkinsonism, unspecified: Secondary | ICD-10-CM | POA: Diagnosis not present

## 2015-08-19 ENCOUNTER — Ambulatory Visit: Payer: Commercial Managed Care - HMO | Admitting: Podiatry

## 2015-08-19 DIAGNOSIS — S6991XD Unspecified injury of right wrist, hand and finger(s), subsequent encounter: Secondary | ICD-10-CM | POA: Diagnosis not present

## 2015-08-19 DIAGNOSIS — G309 Alzheimer's disease, unspecified: Secondary | ICD-10-CM | POA: Diagnosis not present

## 2015-08-19 DIAGNOSIS — R296 Repeated falls: Secondary | ICD-10-CM | POA: Diagnosis not present

## 2015-08-19 DIAGNOSIS — G219 Secondary parkinsonism, unspecified: Secondary | ICD-10-CM | POA: Diagnosis not present

## 2015-08-19 DIAGNOSIS — F0281 Dementia in other diseases classified elsewhere with behavioral disturbance: Secondary | ICD-10-CM | POA: Diagnosis not present

## 2015-08-19 NOTE — Progress Notes (Signed)
Carelink Summary Report / Loop Recorder 

## 2015-08-20 DIAGNOSIS — F0281 Dementia in other diseases classified elsewhere with behavioral disturbance: Secondary | ICD-10-CM | POA: Diagnosis not present

## 2015-08-20 DIAGNOSIS — S6991XD Unspecified injury of right wrist, hand and finger(s), subsequent encounter: Secondary | ICD-10-CM | POA: Diagnosis not present

## 2015-08-20 DIAGNOSIS — G309 Alzheimer's disease, unspecified: Secondary | ICD-10-CM | POA: Diagnosis not present

## 2015-08-20 DIAGNOSIS — G219 Secondary parkinsonism, unspecified: Secondary | ICD-10-CM | POA: Diagnosis not present

## 2015-08-20 DIAGNOSIS — R296 Repeated falls: Secondary | ICD-10-CM | POA: Diagnosis not present

## 2015-08-22 DIAGNOSIS — F0281 Dementia in other diseases classified elsewhere with behavioral disturbance: Secondary | ICD-10-CM | POA: Diagnosis not present

## 2015-08-22 DIAGNOSIS — R296 Repeated falls: Secondary | ICD-10-CM | POA: Diagnosis not present

## 2015-08-22 DIAGNOSIS — G219 Secondary parkinsonism, unspecified: Secondary | ICD-10-CM | POA: Diagnosis not present

## 2015-08-22 DIAGNOSIS — S6991XD Unspecified injury of right wrist, hand and finger(s), subsequent encounter: Secondary | ICD-10-CM | POA: Diagnosis not present

## 2015-08-22 DIAGNOSIS — G309 Alzheimer's disease, unspecified: Secondary | ICD-10-CM | POA: Diagnosis not present

## 2015-08-25 DIAGNOSIS — R296 Repeated falls: Secondary | ICD-10-CM | POA: Diagnosis not present

## 2015-08-25 DIAGNOSIS — F0281 Dementia in other diseases classified elsewhere with behavioral disturbance: Secondary | ICD-10-CM | POA: Diagnosis not present

## 2015-08-25 DIAGNOSIS — G219 Secondary parkinsonism, unspecified: Secondary | ICD-10-CM | POA: Diagnosis not present

## 2015-08-25 DIAGNOSIS — S6991XD Unspecified injury of right wrist, hand and finger(s), subsequent encounter: Secondary | ICD-10-CM | POA: Diagnosis not present

## 2015-08-25 DIAGNOSIS — G309 Alzheimer's disease, unspecified: Secondary | ICD-10-CM | POA: Diagnosis not present

## 2015-08-26 DIAGNOSIS — G309 Alzheimer's disease, unspecified: Secondary | ICD-10-CM | POA: Diagnosis not present

## 2015-08-26 DIAGNOSIS — R296 Repeated falls: Secondary | ICD-10-CM | POA: Diagnosis not present

## 2015-08-26 DIAGNOSIS — F0281 Dementia in other diseases classified elsewhere with behavioral disturbance: Secondary | ICD-10-CM | POA: Diagnosis not present

## 2015-08-26 DIAGNOSIS — S6991XD Unspecified injury of right wrist, hand and finger(s), subsequent encounter: Secondary | ICD-10-CM | POA: Diagnosis not present

## 2015-08-26 DIAGNOSIS — G219 Secondary parkinsonism, unspecified: Secondary | ICD-10-CM | POA: Diagnosis not present

## 2015-08-27 DIAGNOSIS — M79641 Pain in right hand: Secondary | ICD-10-CM | POA: Diagnosis not present

## 2015-08-27 DIAGNOSIS — S6291XS Unspecified fracture of right wrist and hand, sequela: Secondary | ICD-10-CM | POA: Diagnosis not present

## 2015-08-27 DIAGNOSIS — M25441 Effusion, right hand: Secondary | ICD-10-CM | POA: Diagnosis not present

## 2015-08-27 DIAGNOSIS — M6281 Muscle weakness (generalized): Secondary | ICD-10-CM | POA: Diagnosis not present

## 2015-08-28 DIAGNOSIS — G309 Alzheimer's disease, unspecified: Secondary | ICD-10-CM | POA: Diagnosis not present

## 2015-08-28 DIAGNOSIS — F0281 Dementia in other diseases classified elsewhere with behavioral disturbance: Secondary | ICD-10-CM | POA: Diagnosis not present

## 2015-08-28 DIAGNOSIS — S6991XD Unspecified injury of right wrist, hand and finger(s), subsequent encounter: Secondary | ICD-10-CM | POA: Diagnosis not present

## 2015-08-28 DIAGNOSIS — G219 Secondary parkinsonism, unspecified: Secondary | ICD-10-CM | POA: Diagnosis not present

## 2015-08-28 DIAGNOSIS — R296 Repeated falls: Secondary | ICD-10-CM | POA: Diagnosis not present

## 2015-09-03 DIAGNOSIS — S6291XS Unspecified fracture of right wrist and hand, sequela: Secondary | ICD-10-CM | POA: Diagnosis not present

## 2015-09-03 DIAGNOSIS — M79641 Pain in right hand: Secondary | ICD-10-CM | POA: Diagnosis not present

## 2015-09-03 DIAGNOSIS — M25441 Effusion, right hand: Secondary | ICD-10-CM | POA: Diagnosis not present

## 2015-09-03 DIAGNOSIS — M6281 Muscle weakness (generalized): Secondary | ICD-10-CM | POA: Diagnosis not present

## 2015-09-04 DIAGNOSIS — R296 Repeated falls: Secondary | ICD-10-CM | POA: Diagnosis not present

## 2015-09-04 DIAGNOSIS — F0281 Dementia in other diseases classified elsewhere with behavioral disturbance: Secondary | ICD-10-CM | POA: Diagnosis not present

## 2015-09-04 DIAGNOSIS — S6991XD Unspecified injury of right wrist, hand and finger(s), subsequent encounter: Secondary | ICD-10-CM | POA: Diagnosis not present

## 2015-09-04 DIAGNOSIS — G309 Alzheimer's disease, unspecified: Secondary | ICD-10-CM | POA: Diagnosis not present

## 2015-09-04 DIAGNOSIS — G219 Secondary parkinsonism, unspecified: Secondary | ICD-10-CM | POA: Diagnosis not present

## 2015-09-05 DIAGNOSIS — M25441 Effusion, right hand: Secondary | ICD-10-CM | POA: Diagnosis not present

## 2015-09-05 DIAGNOSIS — M79641 Pain in right hand: Secondary | ICD-10-CM | POA: Diagnosis not present

## 2015-09-05 DIAGNOSIS — M6281 Muscle weakness (generalized): Secondary | ICD-10-CM | POA: Diagnosis not present

## 2015-09-05 DIAGNOSIS — S6291XS Unspecified fracture of right wrist and hand, sequela: Secondary | ICD-10-CM | POA: Diagnosis not present

## 2015-09-08 ENCOUNTER — Ambulatory Visit (INDEPENDENT_AMBULATORY_CARE_PROVIDER_SITE_OTHER): Payer: Commercial Managed Care - HMO | Admitting: Pharmacist

## 2015-09-08 DIAGNOSIS — I2609 Other pulmonary embolism with acute cor pulmonale: Secondary | ICD-10-CM

## 2015-09-08 DIAGNOSIS — Z7901 Long term (current) use of anticoagulants: Secondary | ICD-10-CM | POA: Diagnosis not present

## 2015-09-08 DIAGNOSIS — Z7902 Long term (current) use of antithrombotics/antiplatelets: Secondary | ICD-10-CM

## 2015-09-08 DIAGNOSIS — S62616D Displaced fracture of proximal phalanx of right little finger, subsequent encounter for fracture with routine healing: Secondary | ICD-10-CM | POA: Diagnosis not present

## 2015-09-08 LAB — POCT INR: INR: 2.9

## 2015-09-08 NOTE — Progress Notes (Signed)
Anti-Coagulation Progress Note  [name] [name] is a 81 y.o. male who is currently on an anti-coagulation regimen.    RECENT RESULTS: Recent results are below, the most recent result is correlated with a dose of 40 mg. per week: Lab Results  Component Value Date   INR 2.90 09/08/2015   INR 2.00 08/11/2015   INR 2.05* 07/27/2015    ANTI-COAG DOSE: Anticoagulation Dose Instructions as of 09/08/2015      [name] Tue Wed Thu Fri Sat   New Dose 5 mg 7.5 mg 5 mg 5 mg 7.5 mg 5 mg 5 mg       ANTICOAG SUMMARY: Anticoagulation Episode Summary    Current INR goal 2.0-3.0  Next INR check 10/13/2015  INR from last check 2.90 (09/08/2015)  Weekly max dose   Target end date Indefinite  [facility] check [facility]  Preferred lab   Send INR reminders to ANTICOAG IMP   Indications  Pulmonary embolism ([name]) [I26.99] Encounter for long-term use of antiplatelets/antithrombotics [Z79.02]        Comments History of multiple venous embolic episodes. Will continue to annual re-evaluate continued need for warfarin weighing risks vs. benefits.       Anticoagulation Care Providers    Provider Role Specialty Phone number   Bertha Stakes, MD  Internal Medicine [phone]      ANTICOAG TODAY: Anticoagulation Summary as of 09/08/2015    INR goal 2.0-3.0  Selected INR 2.90 (09/08/2015)  Next INR check 10/13/2015  Target end date Indefinite   Indications  Pulmonary embolism ([name]) [I26.99] Encounter for long-term use of antiplatelets/antithrombotics [Z79.02]      Anticoagulation Episode Summary    [facility] check [facility]   Preferred lab    Send INR reminders to ANTICOAG [facility]   Comments History of multiple venous embolic episodes. Will continue to annual re-evaluate continued need for warfarin weighing risks vs. benefits.     Anticoagulation Care Providers    Provider Role Specialty Phone number   Bertha Stakes, MD  Internal Medicine [phone]      PATIENT  INSTRUCTIONS: Patient Instructions  Patient instructed to take medications as defined in the Anti-coagulation Track section of this encounter.  Patient instructed to take today's dose.  Patient verbalized understanding of these instructions.       FOLLOW-UP Return in 5 weeks (on 10/13/2015) for Follow up INR at [place], [name] Pharm.D., CACP

## 2015-09-08 NOTE — Patient Instructions (Signed)
Patient instructed to take medications as defined in the Anti-coagulation Track section of this encounter.  Patient instructed to take today's dose.  Patient verbalized understanding of these instructions.    

## 2015-09-10 DIAGNOSIS — M25441 Effusion, right hand: Secondary | ICD-10-CM | POA: Diagnosis not present

## 2015-09-10 DIAGNOSIS — M6281 Muscle weakness (generalized): Secondary | ICD-10-CM | POA: Diagnosis not present

## 2015-09-10 DIAGNOSIS — S6291XS Unspecified fracture of right wrist and hand, sequela: Secondary | ICD-10-CM | POA: Diagnosis not present

## 2015-09-10 DIAGNOSIS — M79641 Pain in right hand: Secondary | ICD-10-CM | POA: Diagnosis not present

## 2015-09-11 NOTE — Progress Notes (Signed)
I have reviewed Dr. Gladstone Pih note.  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  INR at goal.

## 2015-09-12 ENCOUNTER — Ambulatory Visit (INDEPENDENT_AMBULATORY_CARE_PROVIDER_SITE_OTHER): Payer: Commercial Managed Care - HMO | Admitting: *Deleted

## 2015-09-12 DIAGNOSIS — R55 Syncope and collapse: Secondary | ICD-10-CM

## 2015-09-15 ENCOUNTER — Emergency Department (HOSPITAL_COMMUNITY): Payer: Commercial Managed Care - HMO

## 2015-09-15 ENCOUNTER — Inpatient Hospital Stay (HOSPITAL_COMMUNITY)
Admission: EM | Admit: 2015-09-15 | Discharge: 2015-09-18 | DRG: 389 | Disposition: A | Discharge status: Home Health | Payer: Commercial Managed Care - HMO | Attending: Internal Medicine | Admitting: Internal Medicine

## 2015-09-15 ENCOUNTER — Encounter (HOSPITAL_COMMUNITY): Payer: Self-pay | Admitting: Emergency Medicine

## 2015-09-15 DIAGNOSIS — E785 Hyperlipidemia, unspecified: Secondary | ICD-10-CM | POA: Diagnosis not present

## 2015-09-15 DIAGNOSIS — R451 Restlessness and agitation: Secondary | ICD-10-CM | POA: Diagnosis not present

## 2015-09-15 DIAGNOSIS — I2510 Atherosclerotic heart disease of native coronary artery without angina pectoris: Secondary | ICD-10-CM

## 2015-09-15 DIAGNOSIS — K219 Gastro-esophageal reflux disease without esophagitis: Secondary | ICD-10-CM | POA: Diagnosis present

## 2015-09-15 DIAGNOSIS — I110 Hypertensive heart disease with heart failure: Secondary | ICD-10-CM | POA: Diagnosis present

## 2015-09-15 DIAGNOSIS — R1013 Epigastric pain: Secondary | ICD-10-CM | POA: Diagnosis not present

## 2015-09-15 DIAGNOSIS — F329 Major depressive disorder, single episode, unspecified: Secondary | ICD-10-CM | POA: Diagnosis present

## 2015-09-15 DIAGNOSIS — Z8546 Personal history of malignant neoplasm of prostate: Secondary | ICD-10-CM

## 2015-09-15 DIAGNOSIS — F0390 Unspecified dementia without behavioral disturbance: Secondary | ICD-10-CM | POA: Diagnosis present

## 2015-09-15 DIAGNOSIS — R197 Diarrhea, unspecified: Secondary | ICD-10-CM

## 2015-09-15 DIAGNOSIS — Z86711 Personal history of pulmonary embolism: Secondary | ICD-10-CM | POA: Diagnosis present

## 2015-09-15 DIAGNOSIS — R1084 Generalized abdominal pain: Secondary | ICD-10-CM | POA: Diagnosis not present

## 2015-09-15 DIAGNOSIS — I5022 Chronic systolic (congestive) heart failure: Secondary | ICD-10-CM

## 2015-09-15 DIAGNOSIS — Z7901 Long term (current) use of anticoagulants: Secondary | ICD-10-CM

## 2015-09-15 DIAGNOSIS — D649 Anemia, unspecified: Secondary | ICD-10-CM | POA: Diagnosis present

## 2015-09-15 DIAGNOSIS — G20 Parkinson's disease: Secondary | ICD-10-CM | POA: Diagnosis present

## 2015-09-15 DIAGNOSIS — K567 Ileus, unspecified: Principal | ICD-10-CM

## 2015-09-15 DIAGNOSIS — I252 Old myocardial infarction: Secondary | ICD-10-CM | POA: Diagnosis not present

## 2015-09-15 DIAGNOSIS — I10 Essential (primary) hypertension: Secondary | ICD-10-CM

## 2015-09-15 DIAGNOSIS — R109 Unspecified abdominal pain: Secondary | ICD-10-CM | POA: Diagnosis present

## 2015-09-15 DIAGNOSIS — J90 Pleural effusion, not elsewhere classified: Secondary | ICD-10-CM | POA: Diagnosis not present

## 2015-09-15 DIAGNOSIS — K5900 Constipation, unspecified: Secondary | ICD-10-CM | POA: Diagnosis not present

## 2015-09-15 DIAGNOSIS — I2609 Other pulmonary embolism with acute cor pulmonale: Secondary | ICD-10-CM

## 2015-09-15 DIAGNOSIS — R933 Abnormal findings on diagnostic imaging of other parts of digestive tract: Secondary | ICD-10-CM | POA: Diagnosis not present

## 2015-09-15 LAB — PROTIME-INR
INR: 2.23 — ABNORMAL HIGH (ref 0.00–1.49)
PROTHROMBIN TIME: 24.5 s — AB (ref 11.6–15.2)

## 2015-09-15 LAB — CBC
HCT: 38.7 % — ABNORMAL LOW (ref 39.0–52.0)
HEMOGLOBIN: 12.4 g/dL — AB (ref 13.0–17.0)
MCH: 30 pg (ref 26.0–34.0)
MCHC: 32 g/dL (ref 30.0–36.0)
MCV: 93.5 fL (ref 78.0–100.0)
PLATELETS: 164 10*3/uL (ref 150–400)
RBC: 4.14 MIL/uL — AB (ref 4.22–5.81)
RDW: 16.6 % — ABNORMAL HIGH (ref 11.5–15.5)
WBC: 3.2 10*3/uL — AB (ref 4.0–10.5)

## 2015-09-15 LAB — URINE MICROSCOPIC-ADD ON

## 2015-09-15 LAB — COMPREHENSIVE METABOLIC PANEL
ALT: 20 U/L (ref 17–63)
ANION GAP: 11 (ref 5–15)
AST: 75 U/L — ABNORMAL HIGH (ref 15–41)
Albumin: 3.9 g/dL (ref 3.5–5.0)
Alkaline Phosphatase: 59 U/L (ref 38–126)
BUN: 15 mg/dL (ref 6–20)
CALCIUM: 9.2 mg/dL (ref 8.9–10.3)
CHLORIDE: 110 mmol/L (ref 101–111)
CO2: 22 mmol/L (ref 22–32)
CREATININE: 1 mg/dL (ref 0.61–1.24)
Glucose, Bld: 103 mg/dL — ABNORMAL HIGH (ref 65–99)
Potassium: 3.5 mmol/L (ref 3.5–5.1)
SODIUM: 143 mmol/L (ref 135–145)
Total Bilirubin: 0.7 mg/dL (ref 0.3–1.2)
Total Protein: 7.4 g/dL (ref 6.5–8.1)

## 2015-09-15 LAB — URINALYSIS, ROUTINE W REFLEX MICROSCOPIC
Bilirubin Urine: NEGATIVE
Glucose, UA: NEGATIVE mg/dL
Ketones, ur: NEGATIVE mg/dL
LEUKOCYTES UA: NEGATIVE
Nitrite: NEGATIVE
PROTEIN: 30 mg/dL — AB
SPECIFIC GRAVITY, URINE: 1.018 (ref 1.005–1.030)
pH: 6.5 (ref 5.0–8.0)

## 2015-09-15 LAB — LIPASE, BLOOD: LIPASE: 31 U/L (ref 11–51)

## 2015-09-15 LAB — CBG MONITORING, ED: GLUCOSE-CAPILLARY: 154 mg/dL — AB (ref 65–99)

## 2015-09-15 LAB — I-STAT TROPONIN, ED: TROPONIN I, POC: 0.03 ng/mL (ref 0.00–0.08)

## 2015-09-15 MED ORDER — AMLODIPINE BESYLATE 2.5 MG PO TABS
2.5000 mg | ORAL_TABLET | Freq: Every day | ORAL | Status: DC
  Administered 2015-09-17 – 2015-09-18 (×2): 2.5 mg via ORAL
  Filled 2015-09-15 (×3): qty 1

## 2015-09-15 MED ORDER — WARFARIN SODIUM 7.5 MG PO TABS
7.5000 mg | ORAL_TABLET | ORAL | Status: DC
  Filled 2015-09-15: qty 1

## 2015-09-15 MED ORDER — SODIUM CHLORIDE 0.9 % IV SOLN: INTRAVENOUS | Status: AC

## 2015-09-15 MED ORDER — WARFARIN - PHARMACIST DOSING INPATIENT: Freq: Every day | Status: DC

## 2015-09-15 MED ORDER — IOHEXOL 300 MG/ML  SOLN
100.0000 mL | Freq: Once | INTRAMUSCULAR | Status: AC | PRN
  Administered 2015-09-15: 100 mL via INTRAVENOUS

## 2015-09-15 MED ORDER — IOHEXOL 300 MG/ML  SOLN
25.0000 mL | Freq: Once | INTRAMUSCULAR | Status: AC | PRN
  Administered 2015-09-15: 25 mL via ORAL

## 2015-09-15 MED ORDER — ACETAMINOPHEN 325 MG PO TABS
650.0000 mg | ORAL_TABLET | Freq: Four times a day (QID) | ORAL | Status: DC | PRN
  Administered 2015-09-16 – 2015-09-17 (×2): 650 mg via ORAL
  Filled 2015-09-15 (×2): qty 2

## 2015-09-15 MED ORDER — WARFARIN SODIUM 7.5 MG PO TABS: 7.5000 mg | ORAL_TABLET | ORAL | Status: DC

## 2015-09-15 MED ORDER — ONDANSETRON HCL 4 MG/2ML IJ SOLN: 4.0000 mg | Freq: Three times a day (TID) | INTRAMUSCULAR | Status: DC | PRN

## 2015-09-15 MED ORDER — SODIUM CHLORIDE 0.9 % IV SOLN
INTRAVENOUS | Status: AC
Start: 2015-09-15 — End: 2015-09-16
  Administered 2015-09-15: 22:00:00 via INTRAVENOUS

## 2015-09-15 MED ORDER — SODIUM CHLORIDE 0.9 % IV BOLUS (SEPSIS)
1000.0000 mL | Freq: Once | INTRAVENOUS | Status: AC
  Administered 2015-09-15: 1000 mL via INTRAVENOUS

## 2015-09-15 NOTE — ED Notes (Signed)
Per family pt complaint of abdominal pain and generalized weakness for a week.

## 2015-09-15 NOTE — H&P (Signed)
Date: 09/15/2015               Patient Name:  [name] [name] MRN: [id number]  DOB: Dec 03, 1930 Age / Sex: 81 y.o., male   PCP: Francesca Oman, DO         Medical Service: Internal Medicine Teaching Service         Attending Physician: Dr. Aldine Contes, MD    First Contact: Dr. Juleen China Pager: [phone]  Second Contact: Dr. [name] Pager: [phone]       After Hours (After 5p/  First Contact Pager: [phone]  weekends / holidays): Second Contact Pager: [phone]   Chief Complaint: Abdominal pain, diarrhea   History of Present Illness: [name] is a 81yo man with PMHx of HTN, PE on coumadin, CAD, hyperlipidemia, and prostate cancer s/p surgical tx who presents to the ED with a 1 week hx of abdominal pain and diarrhea. His daughter is present at the bedside and provides the majority of the history. She describes he started having watery diarrhea about 1 week ago and then noticed over the last 1-2 days that he is having "clear jelly" for his bowel movements. She also states he had epigastric abdominal pain for the last few days. Patient states he currently does not have any pain. He denies fevers, chills, nausea, vomiting, or bloody stools. He has never had a colonoscopy due to "his fall risk and coumadin use" per the daughter. She reports he has been dealing with constipation for over a year. He was taking milk of magnesia for several months which seemed to help but then his family stopped buying it for him.  He had negative hemoccult stool cards in August this year.   In the ED a CT Abd/Pelvis showed moderate distention of the stomach and proximal small bowel fluid levels that is compatible with an ileus or partial SBO. No associated mass lesion present. The ED physician spoke with general surgery and they believe his condition to be more ileus-related than SBO.   Meds: Current Facility-Administered Medications  Medication Dose Route Frequency Provider Last Rate Last Dose  . 0.9 %  sodium  chloride infusion   Intravenous STAT Anne Ng, [place] 75 mL/hr at 09/15/15 2134    . 0.9 %  sodium chloride infusion   Intravenous Continuous Evaline Waltman Montey Hora, MD      . acetaminophen (TYLENOL) tablet 650 mg  650 mg Oral Q6H PRN Juliet Rude, MD      . Derrill Memo ON 09/16/2015] amLODipine (NORVASC) tablet 2.5 mg  2.5 mg Oral Daily Sitara Cashwell J Lashay Osborne, MD      . ondansetron (ZOFRAN) injection 4 mg  4 mg Intravenous Q8H PRN Sevag Shearn J Alethia Melendrez, MD      . warfarin (COUMADIN) tablet 7.5 mg  7.5 mg Oral Once per day on Mon Thu Aldine Contes, MD       And  . warfarin (COUMADIN) tablet 7.5 mg  7.5 mg Oral Once per day on Sun Tue Wed Fri Sat Aldine Contes, MD      . Derrill Memo ON 09/16/2015] Warfarin - Pharmacist Dosing Inpatient   Does not apply [id number] Aldine Contes, MD        Allergies: Allergies as of 09/15/2015 - Review Complete 09/15/2015  Allergen Reaction Noted  . Aricept [donepezil hcl] Other (See Comments) 07/24/2013  . Other Other (See Comments) 08/17/2011   Past Medical History  Diagnosis Date  . PE (pulmonary embolism)     unprovoked PE completed 6 months  of warfarin, warfarin d/ced 02/12/2010, repeat PE 07/10/2010 after a long car ride and now on lifelong coumadin.  [name] Hypertension   . Pituitary microadenoma ([place])     incidental finding CT 12/2009  . Prostate cancer ([place])   . Depression   . Hyperlipidemia   . Coronary artery disease, non-occlusive     with history of MI; Cath 2008 w multivessel nonobstructive CAD  . Dementia   . GERD (gastroesophageal reflux disease)   . Scoliosis   . Anxiety    Past Surgical History  Procedure Laterality Date  . Prostatectomy    . Left heart catheterization with coronary angiogram N/A 12/10/2014    Procedure: LEFT HEART CATHETERIZATION WITH CORONARY ANGIOGRAM;  Surgeon: [name], MD;  Location[facility] CATH LAB;  Service: Cardiovascular;  Laterality: N/A;  . Loop recorder implant N/A 12/16/2014    Procedure: LOOP RECORDER IMPLANT;  Surgeon: [name], MD;  Location: [facility] CATH LAB;  Service: Cardiovascular;  Laterality: N/A;   Family History  Problem Relation Age of Onset  . Hypertension Father     Passed away from cerebral hemorrhage at age of 25.  [name] Dementia Mother     Passed away  . Hypertension Child     4 adult children  . Cervical cancer Other   . Lung cancer Other   . Stroke Other   . Heart attack Neg Hx    Social History   Social History  . Marital Status: Married    Spouse Name: N/A  . Number of Children: 4  . Years of Education: master's   Occupational History  . Retired     retired   Social History Main Topics  . Smoking status: Never Smoker   . Smokeless tobacco: Never Used  . Alcohol Use: No  . Drug Use: No  . Sexual Activity: Yes   Other Topics Concern  . Not on file   Social History Narrative   Lives with daughter. Wife also has severe dementia.    Drinks 1 cup of coffee a day     Review of Systems: General: Denies night sweats, changes in weight HEENT: Denies headaches, ear pain, changes in vision, rhinorrhea, sore throat CV: Denies CP, palpitations, SOB, orthopnea Pulm: Denies SOB, cough, wheezing GI: See HPI GU: Denies dysuria, hematuria, frequency Msk: Denies muscle cramps, joint pains Neuro: Denies weakness, numbness, tingling Skin: Denies rashes, bruising Psych: Denies depression, anxiety, hallucinations  Physical Exam: Blood pressure 184/104, pulse 66, temperature 99 F (37.2 C), temperature source Oral, resp. rate 18, height 6' (1.829 m), weight 151 lb 1.6 oz (68.539 kg), SpO2 99 %. General: Elderly man resting in bed, NAD HEENT: [place]/AT, EOMI, sclera anicteric, mucus membranes moist CV: RRR, no m/g/r Pulm: CTA bilaterally, breaths non-labored Abd: BS+, soft, mildly distended, non-tender Ext: warm, trace peripheral edema Neuro: alert and oriented x 3, no focal deficits  Lab results: Basic Metabolic Panel:  Recent Labs  09/15/15 1242  NA 143  K 3.5  CL 110  CO2 22    GLUCOSE 103*  BUN 15  CREATININE 1.00  CALCIUM 9.2   Liver Function Tests:  Recent Labs  09/15/15 1242  AST 75*  ALT 20  ALKPHOS 59  BILITOT 0.7  PROT 7.4  ALBUMIN 3.9    Recent Labs  09/15/15 1242  LIPASE 31   No results for input(s): AMMONIA in the last 72 hours. CBC:  Recent Labs  09/15/15 1242  WBC 3.2*  HGB 12.4*  HCT 38.7*  MCV 93.5  PLT 164   CBG:  Recent Labs  09/15/15 1247  GLUCAP 154*   Coagulation:  Recent Labs  09/15/15 1526  LABPROT 24.5*  INR 2.23*   Urine Drug Screen: Drugs of Abuse     Component Value Date/Time   LABOPIA NONE DETECTED 08/11/2011 2331   COCAINSCRNUR NONE DETECTED 08/11/2011 2331   LABBENZ NONE DETECTED 08/11/2011 2331   AMPHETMU NONE DETECTED 08/11/2011 2331   THCU NONE DETECTED 08/11/2011 2331   LABBARB NONE DETECTED 08/11/2011 2331    Urinalysis:  Recent Labs  09/15/15 1709  COLORURINE YELLOW  LABSPEC 1.018  PHURINE 6.5  GLUCOSEU NEGATIVE  HGBUR TRACE*  BILIRUBINUR NEGATIVE  KETONESUR NEGATIVE  PROTEINUR 30*  NITRITE NEGATIVE  LEUKOCYTESUR NEGATIVE   Imaging results:  Dg Chest 2 View  09/15/2015  CLINICAL DATA:  Weakness EXAM: CHEST  2 VIEW COMPARISON:  12/13/2014 FINDINGS: Cardiomediastinal silhouette is stable. There is small left pleural prior left basilar atelectasis or infiltrate. No pulmonary edema. IMPRESSION: Small left pleural effusion left basilar atelectasis or infiltrate. No pulmonary edema. Electronically Signed   By: Lahoma Crocker M.D.   On: 09/15/2015 15:57   Ct Abdomen Pelvis W Contrast  09/15/2015  CLINICAL DATA:  Generalized abdominal pain. EXAM: CT ABDOMEN AND PELVIS WITH CONTRAST TECHNIQUE: Multidetector CT imaging of the abdomen and pelvis was performed using the standard protocol following bolus administration of intravenous contrast. CONTRAST:  129mL OMNIPAQUE IOHEXOL 300 MG/ML SOLN, 47mL OMNIPAQUE IOHEXOL 300 MG/ML SOLN COMPARISON:  CT abdomen and pelvis 06/01/2013. FINDINGS:  Lower chest: Linear airspace opacities are present at the left base. There is mild atelectasis at the right base. Coronary artery calcifications are again seen. The heart size is normal. There are no significant pleural or pericardial effusions. Hepatobiliary: The liver, common bile duct, and gallbladder are within normal limits. Pancreas: Negative Spleen: Within normal limits Adrenals/Urinary Tract: A 3.5 cm benign appearing cyst at the lower pole of the right kidney is slightly larger than on the prior study. No other focal lesions are present. The ureters are within normal limits. The urinary bladder is unremarkable. Stomach/Bowel: The stomach is moderately distended. Fluid levels in mild distention are present in the proximal small bowel. There is a relative transition point in the mid lower abdomen. Extensive stool is present throughout the colon to the level of the rectosigmoid colon which demonstrates gaseous distension to the rectum. There is some fluid at the rectum. No obstructing lesion is present. Surgical clips are present about about the prostate. Vascular/Lymphatic: Extensive atherosclerotic calcifications are present without aneurysm. There is no significant adenopathy. Reproductive: Prostatectomy. Other: No significant free fluid is present. Musculoskeletal: Rightward curvature of the lumbar spine is centered at L2-3. There is right lateral listhesis at L3-4 and L4-5. Multilevel foraminal disease is due to facet hypertrophy and endplate osteophyte formation. IMPRESSION: 1. Moderate distention of the stomach and proximal small bowel fluid levels. This is compatible with a ileus or a partial distal small bowel obstruction. There may be a transition point in low abdomen. No associated mass lesion is present. 2. Moderate distention of the colon with extensive stool to the level of the sigmoid colon. 3. Extensive atherosclerotic disease including coronary artery disease. 4. Bibasilar atelectasis, left  greater than right. 5. Scoliosis and multilevel foraminal narrowing. Electronically Signed   By: San Morelle M.D.   On: 09/15/2015 16:59    Assessment & Plan by Problem:  Likely Ileus: Patient presenting with a 1 week  history of diarrhea/watery stools and epigastric abdominal pain. CT Abd/Pelvis with likely ileus vs partial SBO. Given he is afebrile, has a benign abdominal exam, and no symptoms of nausea or vomiting and abdominal pain is resolving it is likely to be an ileus than SBO. Per the daughter, he seems to have chronic constipation that was relieved when he was taking milk of magnesia regularly. He is not on chronic opioids, but is taking carbidopa-levodopa which could be responsible for his constipation. Will hydrate him with IVFs, monitor his electrolytes, and re-evaluate in AM to see if he can tolerate diet. - NS at 75 ml/hr given his CHF hx - NPO, can try clears in AM - Zofran PRN nausea - Place NGT if starts to have emesis - Tylenol prn pain - cmet in AM - Monitor for BM  Hx Pulmonary Emboli: Hx unprovoked PE in 2010 and completed 6 months of coumadin therapy. Then after coumadin discontinued, he had another PE in Oct 2011 and has been on coumadin since that time. His INR was therapeutic on admission at 2.23.  - Continue coumadin   Normocytic Anemia: Hgb 12.4 on admission. His baseline Hgb is in the low 12 range. He has never had a colonoscopy but stool cards negative this year. No active bleeding.  - Continue to monitor   HTN: BP elevated in [id number] systolic on admission. Could be elevated secondary to pain. He is on Amlodipine 2.5 mg daily and Lasix 20 mg daily at home.  - Continue home meds   CAD: No chest pain. Hx of MI in 2008. Last cardiac cath in March 2016 with single vessel disease in the LAD.  - Continue ASA, statin  Hyperlipidemia:  - Continue home Atorvastatin 40 mg daily   Diet: NPO for now, can consider clears in AM VTE PPx: Coumadin  Dispo:  Disposition is deferred at this time, awaiting improvement of current medical problems. Anticipated discharge in approximately 1-2 day(s).   The patient does have a current PCP Francesca Oman, DO) and does need an [facility] hospital follow-up appointment after discharge.  The patient does not have transportation limitations that hinder transportation to clinic appointments.  Signed: Juliet Rude, MD 09/15/2015, 11:59 PM

## 2015-09-15 NOTE — Progress Notes (Signed)
Notified Dr. Posey Pronto that pt has arrived from [facility] ED. Dr. Posey Pronto stated they would come see pt. Will continue to monitor pt. Ranelle Oyster, RN

## 2015-09-15 NOTE — ED Provider Notes (Signed)
CSN: [id number]     Arrival date & time 09/15/15  1203 History   First MD Initiated Contact with Patient 09/15/15 1524     Chief Complaint  Patient presents with  . Weakness  . Abdominal Pain   HPI  [name] [name] is an 81 y.o. male with history of recurrent PE (on Warfarin), HTN, CAD, dementia, Parkinsonian syndrome, who presents to the ED for evaluation of abdominal pain, diarrhea, and weakness. He is accompanied by several family members who provide his history. Per his family, pt initially started with watery diarrhea one week ago. His daughter states that for the past few days his bowel movements have now been watery, jelly, clear mucous-like bowel movements. They state that he has had not any solid or normal colored stools for several days. His family members also report that he has been intermittently complaining of epigastric abdominal pain for the past several days. They state that he seems to have decreased appetite and seems to be a bit weaker. They state that at baseline he walks with the assistance of walker but it is difficult to determine objectively if he is much weaker due to his history of Parkinsonian syndrome. Pt's family has not witnessed any episodes of emesis, bloody diarrhea, falls, LOC, or fever. Pt is nonverbal in the room today which his family states is baseline as his mental function often waxes and wanes with his dementia.   Past Medical History  Diagnosis Date  . PE (pulmonary embolism)     unprovoked PE completed 6 months of warfarin, warfarin d/ced 02/12/2010, repeat PE 07/10/2010 after a long car ride and now on lifelong coumadin.  [name] Hypertension   . Pituitary microadenoma ([place])     incidental finding CT 12/2009  . Prostate cancer ([place])   . Depression   . Hyperlipidemia   . Coronary artery disease, non-occlusive     with history of MI; Cath 2008 w multivessel nonobstructive CAD  . Dementia   . GERD (gastroesophageal reflux disease)   . Scoliosis   . Anxiety     Past Surgical History  Procedure Laterality Date  . Prostatectomy    . Left heart catheterization with coronary angiogram N/A 12/10/2014    Procedure: LEFT HEART CATHETERIZATION WITH CORONARY ANGIOGRAM;  Surgeon: [name], MD;  Location: [facility] CATH LAB;  Service: Cardiovascular;  Laterality: N/A;  . Loop recorder implant N/A 12/16/2014    Procedure: LOOP RECORDER IMPLANT;  Surgeon: [name], MD;  Location: [facility] CATH LAB;  Service: Cardiovascular;  Laterality: N/A;   Family History  Problem Relation Age of Onset  . Hypertension Father     Passed away from cerebral hemorrhage at age of 40.  [name] Dementia Mother     Passed away  . Hypertension Child     4 adult children  . Cervical cancer Other   . Lung cancer Other   . Stroke Other   . Heart attack Neg Hx    Social History  Substance Use Topics  . Smoking status: Never Smoker   . Smokeless tobacco: Never Used  . Alcohol Use: No    Review of Systems  Unable to perform ROS: Dementia      Allergies  Aricept and Other  Home Medications   Prior to Admission medications   Medication Sig Start Date End Date Taking? Authorizing Provider  acetaminophen (TYLENOL) 500 MG tablet Take 500 mg by mouth every 4 (four) hours as needed for mild pain or headache.  Yes Historical Provider, MD  amLODipine (NORVASC) 2.5 MG tablet TAKE 1 TABLET (2.5 MG TOTAL) BY MOUTH DAILY. 07/08/15  Yes [name], MD  atorvastatin (LIPITOR) 40 MG tablet TAKE 1 TABLET (40 MG TOTAL) BY MOUTH EVERY EVENING. 07/14/15  Yes Francesca Oman, DO  b complex vitamins tablet Take 1 tablet by mouth daily.   Yes Historical Provider, MD  Calcium Carbonate-Vitamin D (CALCIUM-VITAMIN D) 500-200 MG-UNIT per tablet Take 1 tablet by mouth daily.   Yes Historical Provider, MD  carbidopa-levodopa (SINEMET IR) 25-100 MG per tablet Take 1 tablet by mouth 2 (two) times daily. 12/17/14  Yes Isaiah Serge, NP  fish oil-omega-3 fatty acids 1000 MG capsule Take 2 g by mouth  daily.    Yes Historical Provider, MD  furosemide (LASIX) 20 MG tablet Take 1 tablet (20 mg total) by mouth daily. 04/14/15  Yes Scott T Kathlen Mody, PA-C  memantine (NAMENDA) 10 MG tablet Take 1 tablet (10 mg total) by mouth 2 (two) times daily. 07/17/15  Yes Garvin Fila, MD  Multiple Vitamins-Minerals (MULTIVITAMIN WITH MINERALS) tablet Take 1 tablet by mouth daily.   Yes Historical Provider, MD  omeprazole (PRILOSEC) 20 MG capsule TAKE 1 CAPSULE (20 MG TOTAL) BY MOUTH DAILY. 07/23/15  Yes Francesca Oman, DO  potassium chloride (K-DUR) 10 MEQ tablet Take 1 tablet (10 mEq total) by mouth as directed. 1st dose take 2 tabs = 20 meq then decrease to 1 tab = 10 meq daily Patient taking differently: Take 10 mEq by mouth daily.  07/10/15  Yes [name], MD  vitamin C (ASCORBIC ACID) 500 MG tablet Take 500 mg by mouth daily.   Yes Historical Provider, MD  warfarin (COUMADIN) 5 MG tablet Take as directed. Patient taking differently: Take 5-7.5 mg by mouth daily. 7.5mg  on Monday and Thursday and 5 mg all other days 07/10/15  Yes Francesca Oman, DO  aspirin EC 81 MG EC tablet Take 1 tablet (81 mg total) by mouth daily. 12/17/14   Isaiah Serge, NP  traZODone (DESYREL) 50 MG tablet Take 1 tablet (50 mg total) by mouth at bedtime. 08/11/15   Alexa Sherral Hammers, MD   BP 170/114 mmHg  Pulse 68  Temp(Src) 98 F (36.7 C) (Oral)  Resp 14  SpO2 96% Physical Exam  HENT:  Right Ear: External ear normal.  Left Ear: External ear normal.  Nose: Nose normal.  Mouth/Throat: Oropharynx is clear and moist. No oropharyngeal exudate.  Eyes: Conjunctivae and EOM are normal. Pupils are equal, round, and reactive to light.  Neck: Normal range of motion. Neck supple.  Cardiovascular: Normal rate, regular rhythm, normal heart sounds and intact distal pulses.   Pulmonary/Chest: Effort normal and breath sounds normal. No respiratory distress. He has no wheezes. He exhibits no tenderness.  Abdominal: Soft. Bowel sounds are  normal. He exhibits no distension. There is no tenderness. There is no rebound and no guarding.  Musculoskeletal: He exhibits no edema or tenderness.  Neurological: He displays no tremor.  Skin: Skin is warm and dry. No erythema.  Psychiatric: He has a normal mood and affect.  Nursing note and vitals reviewed.   ED Course  Procedures (including critical care time) Labs Review Labs Reviewed  COMPREHENSIVE METABOLIC PANEL - Abnormal; Notable for the following:    Glucose, Bld 103 (*)    AST 75 (*)    All other components within normal limits  CBC - Abnormal; Notable for the following:  WBC 3.2 (*)    RBC 4.14 (*)    Hemoglobin 12.4 (*)    HCT 38.7 (*)    RDW 16.6 (*)    All other components within normal limits  URINALYSIS, ROUTINE W REFLEX MICROSCOPIC (NOT AT [facility]) - Abnormal; Notable for the following:    APPearance CLOUDY (*)    Hgb urine dipstick TRACE (*)    Protein, ur 30 (*)    All other components within normal limits  PROTIME-INR - Abnormal; Notable for the following:    Prothrombin Time 24.5 (*)    INR 2.23 (*)    All other components within normal limits  URINE MICROSCOPIC-ADD ON - Abnormal; Notable for the following:    Squamous Epithelial / LPF 0-5 (*)    Bacteria, UA FEW (*)    Casts HYALINE CASTS (*)    All other components within normal limits  CBG MONITORING, ED - Abnormal; Notable for the following:    Glucose-Capillary 154 (*)    All other components within normal limits  LIPASE, BLOOD  I-STAT TROPOININ, ED    Imaging Review Dg Chest 2 View  09/15/2015  CLINICAL DATA:  Weakness EXAM: CHEST  2 VIEW COMPARISON:  12/13/2014 FINDINGS: Cardiomediastinal silhouette is stable. There is small left pleural prior left basilar atelectasis or infiltrate. No pulmonary edema. IMPRESSION: Small left pleural effusion left basilar atelectasis or infiltrate. No pulmonary edema. Electronically Signed   By: Lahoma Crocker M.D.   On: 09/15/2015 15:57   Ct Abdomen Pelvis W  Contrast  09/15/2015  CLINICAL DATA:  Generalized abdominal pain. EXAM: CT ABDOMEN AND PELVIS WITH CONTRAST TECHNIQUE: Multidetector CT imaging of the abdomen and pelvis was performed using the standard protocol following bolus administration of intravenous contrast. CONTRAST:  130mL OMNIPAQUE IOHEXOL 300 MG/ML SOLN, 86mL OMNIPAQUE IOHEXOL 300 MG/ML SOLN COMPARISON:  CT abdomen and pelvis 06/01/2013. FINDINGS: Lower chest: Linear airspace opacities are present at the left base. There is mild atelectasis at the right base. Coronary artery calcifications are again seen. The heart size is normal. There are no significant pleural or pericardial effusions. Hepatobiliary: The liver, common bile duct, and gallbladder are within normal limits. Pancreas: Negative Spleen: Within normal limits Adrenals/Urinary Tract: A 3.5 cm benign appearing cyst at the lower pole of the right kidney is slightly larger than on the prior study. No other focal lesions are present. The ureters are within normal limits. The urinary bladder is unremarkable. Stomach/Bowel: The stomach is moderately distended. Fluid levels in mild distention are present in the proximal small bowel. There is a relative transition point in the mid lower abdomen. Extensive stool is present throughout the colon to the level of the rectosigmoid colon which demonstrates gaseous distension to the rectum. There is some fluid at the rectum. No obstructing lesion is present. Surgical clips are present about about the prostate. Vascular/Lymphatic: Extensive atherosclerotic calcifications are present without aneurysm. There is no significant adenopathy. Reproductive: Prostatectomy. Other: No significant free fluid is present. Musculoskeletal: Rightward curvature of the lumbar spine is centered at L2-3. There is right lateral listhesis at L3-4 and L4-5. Multilevel foraminal disease is due to facet hypertrophy and endplate osteophyte formation. IMPRESSION: 1. Moderate  distention of the stomach and proximal small bowel fluid levels. This is compatible with a ileus or a partial distal small bowel obstruction. There may be a transition point in low abdomen. No associated mass lesion is present. 2. Moderate distention of the colon with extensive stool to the level of the sigmoid  colon. 3. Extensive atherosclerotic disease including coronary artery disease. 4. Bibasilar atelectasis, left greater than right. 5. Scoliosis and multilevel foraminal narrowing. Electronically Signed   By: San Morelle M.D.   On: 09/15/2015 16:59   I have personally reviewed and evaluated these images and lab results as part of my medical decision-making.   EKG Interpretation None      MDM   Final diagnoses:  Ileus ([place])  Diarrhea, unspecified type    Unremarkable exam. Pt does appear dry on exam with signs of dehydration on UA so 1L NS bolus given in the ED. His labs are otherwise unremarkable. Anemia at baseline. CXR shows small left pleural effusion that is suggestive of atelectasis vs. Infiltrate. CT abd shows evidence of ileus or partial SBO. However, given his history of diarrhea, lack of N/V this is more likely to be ileus 2/2 to diarrheal illness rather than SBO. I did speak to general surgery to confirm and they agree that pt is not a surgical candidate and this is more likely ileus rather than SBO. Will call internal medicine teaching service at [facility] for admission.   Spoke to internal med resident at [facility], will transfer to [facility] for further evaluation and management.     Anne Ng, Hershal Coria 09/15/15 2114  Charlesetta Shanks, MD 09/27/15 1105

## 2015-09-15 NOTE — ED Notes (Signed)
Pts family told that a urine sample is needed from him, states that he just used the bathroom

## 2015-09-15 NOTE — Progress Notes (Signed)
ANTICOAGULATION CONSULT NOTE - Initial Consult  Pharmacy Consult for Coumadin Indication: pulmonary embolus  Allergies  Allergen Reactions  . Aricept [Donepezil Hcl] Other (See Comments)    Symptomatic Bradycardia.  . Other Other (See Comments)    Shrimp gives him gout    Patient Measurements: Height: 6' (182.9 cm) Weight: 151 lb 1.6 oz (68.539 kg) IBW/kg (Calculated) : 77.6  Vital Signs: Temp: 99 F (37.2 C) (12/26 2107) Temp Source: Oral (12/26 2107) BP: 184/104 mmHg (12/26 2107) Pulse Rate: 66 (12/26 2107)  Labs:  Recent Labs  09/15/15 1242 09/15/15 1526  HGB 12.4*  --   HCT 38.7*  --   PLT 164  --   LABPROT  --  24.5*  INR  --  2.23*  CREATININE 1.00  --     Estimated Creatinine Clearance: 55.2 mL/min (by C-G formula based on Cr of 1).   Medical History: Past Medical History  Diagnosis Date  . PE (pulmonary embolism)     unprovoked PE completed 6 months of warfarin, warfarin d/ced 02/12/2010, repeat PE 07/10/2010 after a long car ride and now on lifelong coumadin.  [name] Hypertension   . Pituitary microadenoma ([place])     incidental finding CT 12/2009  . Prostate cancer ([place])   . Depression   . Hyperlipidemia   . Coronary artery disease, non-occlusive     with history of MI; Cath 2008 w multivessel nonobstructive CAD  . Dementia   . GERD (gastroesophageal reflux disease)   . Scoliosis   . Anxiety     Medications:  Prescriptions prior to admission  Medication Sig Dispense Refill Last Dose  . acetaminophen (TYLENOL) 500 MG tablet Take 500 mg by mouth every 4 (four) hours as needed for mild pain or headache.   09/15/2015 at Unknown time  . amLODipine (NORVASC) 2.5 MG tablet TAKE 1 TABLET (2.5 MG TOTAL) BY MOUTH DAILY. 90 tablet 3 09/15/2015 at Unknown time  . atorvastatin (LIPITOR) 40 MG tablet TAKE 1 TABLET (40 MG TOTAL) BY MOUTH EVERY EVENING. 90 tablet 1 09/14/2015 at Unknown time  . b complex vitamins tablet Take 1 tablet by mouth daily.   09/15/2015 at  Unknown time  . Calcium Carbonate-Vitamin D (CALCIUM-VITAMIN D) 500-200 MG-UNIT per tablet Take 1 tablet by mouth daily.   09/15/2015 at Unknown time  . carbidopa-levodopa (SINEMET IR) 25-100 MG per tablet Take 1 tablet by mouth 2 (two) times daily. 60 tablet 3 09/15/2015 at Unknown time  . fish oil-omega-3 fatty acids 1000 MG capsule Take 2 g by mouth daily.    09/15/2015 at Unknown time  . furosemide (LASIX) 20 MG tablet Take 1 tablet (20 mg total) by mouth daily.   09/15/2015 at Unknown time  . memantine (NAMENDA) 10 MG tablet Take 1 tablet (10 mg total) by mouth 2 (two) times daily. 60 tablet 3 09/15/2015 at Unknown time  . Multiple Vitamins-Minerals (MULTIVITAMIN WITH MINERALS) tablet Take 1 tablet by mouth daily.   09/15/2015 at Unknown time  . omeprazole (PRILOSEC) 20 MG capsule TAKE 1 CAPSULE (20 MG TOTAL) BY MOUTH DAILY. 90 capsule 0 09/15/2015 at Unknown time  . potassium chloride (K-DUR) 10 MEQ tablet Take 1 tablet (10 mEq total) by mouth as directed. 1st dose take 2 tabs = 20 meq then decrease to 1 tab = 10 meq daily (Patient taking differently: Take 10 mEq by mouth daily. ) 90 tablet 3 09/15/2015 at Unknown time  . vitamin C (ASCORBIC ACID) 500 MG tablet Take 500 mg  by mouth daily.   09/15/2015 at Unknown time  . warfarin (COUMADIN) 5 MG tablet Take as directed. (Patient taking differently: Take 5-7.5 mg by mouth daily. 7.5mg  on Monday and Thursday and 5 mg all other days) 40 tablet 2 09/14/2015 at 1800  . aspirin EC 81 MG EC tablet Take 1 tablet (81 mg total) by mouth daily.   2 months  . traZODone (DESYREL) 50 MG tablet Take 1 tablet (50 mg total) by mouth at bedtime. 30 tablet 2 Taking    Assessment: 81 y.o. male admitted with weakness/abdomnla pain, h/o PE, to continue Coumadin  Goal of Therapy:  INR 2-3 Monitor platelets by anticoagulation protocol: Yes   Plan:  Continue home regimen Daily INR  Lenae Wherley, Bronson Curb 09/15/2015,10:51 PM

## 2015-09-16 DIAGNOSIS — R1013 Epigastric pain: Secondary | ICD-10-CM

## 2015-09-16 DIAGNOSIS — Z7982 Long term (current) use of aspirin: Secondary | ICD-10-CM

## 2015-09-16 DIAGNOSIS — Z86711 Personal history of pulmonary embolism: Secondary | ICD-10-CM

## 2015-09-16 DIAGNOSIS — R933 Abnormal findings on diagnostic imaging of other parts of digestive tract: Secondary | ICD-10-CM

## 2015-09-16 DIAGNOSIS — Z7901 Long term (current) use of anticoagulants: Secondary | ICD-10-CM

## 2015-09-16 LAB — COMPREHENSIVE METABOLIC PANEL
ALBUMIN: 3.3 g/dL — AB (ref 3.5–5.0)
ALK PHOS: 57 U/L (ref 38–126)
ALT: 69 U/L — AB (ref 17–63)
ANION GAP: 10 (ref 5–15)
AST: 72 U/L — ABNORMAL HIGH (ref 15–41)
BUN: 8 mg/dL (ref 6–20)
CHLORIDE: 106 mmol/L (ref 101–111)
CO2: 26 mmol/L (ref 22–32)
CREATININE: 1.01 mg/dL (ref 0.61–1.24)
Calcium: 9 mg/dL (ref 8.9–10.3)
GFR calc non Af Amer: >60 mL/min (ref >60)
GLUCOSE: 86 mg/dL (ref 65–99)
Potassium: 3.2 mmol/L — ABNORMAL LOW (ref 3.5–5.1)
SODIUM: 142 mmol/L (ref 135–145)
Total Bilirubin: 1.3 mg/dL — ABNORMAL HIGH (ref 0.3–1.2)
Total Protein: 7 g/dL (ref 6.5–8.1)

## 2015-09-16 LAB — CBC
HCT: 39.8 % (ref 39.0–52.0)
HEMOGLOBIN: 12.7 g/dL — AB (ref 13.0–17.0)
MCH: 29.7 pg (ref 26.0–34.0)
MCHC: 31.9 g/dL (ref 30.0–36.0)
MCV: 93 fL (ref 78.0–100.0)
Platelets: 168 10*3/uL (ref 150–400)
RBC: 4.28 MIL/uL (ref 4.22–5.81)
RDW: 16.5 % — ABNORMAL HIGH (ref 11.5–15.5)
WBC: 3.9 10*3/uL — ABNORMAL LOW (ref 4.0–10.5)

## 2015-09-16 LAB — MRSA PCR SCREENING: MRSA BY PCR: NEGATIVE

## 2015-09-16 MED ORDER — RISPERIDONE 0.25 MG PO TABS
0.2500 mg | ORAL_TABLET | Freq: Once | ORAL | Status: DC
  Filled 2015-09-16: qty 1

## 2015-09-16 MED ORDER — ATORVASTATIN CALCIUM 40 MG PO TABS
40.0000 mg | ORAL_TABLET | Freq: Every day | ORAL | Status: DC
  Administered 2015-09-16 – 2015-09-17 (×2): 40 mg via ORAL
  Filled 2015-09-16 (×2): qty 1

## 2015-09-16 MED ORDER — POTASSIUM CHLORIDE 10 MEQ/100ML IV SOLN
10.0000 meq | INTRAVENOUS | Status: AC
  Administered 2015-09-16 (×6): 10 meq via INTRAVENOUS
  Filled 2015-09-16 (×6): qty 100

## 2015-09-16 MED ORDER — ASPIRIN EC 81 MG PO TBEC
81.0000 mg | DELAYED_RELEASE_TABLET | Freq: Every day | ORAL | Status: DC
  Administered 2015-09-17 – 2015-09-18 (×2): 81 mg via ORAL
  Filled 2015-09-16 (×3): qty 1

## 2015-09-16 MED ORDER — LORAZEPAM 2 MG/ML IJ SOLN
0.5000 mg | Freq: Once | INTRAMUSCULAR | Status: AC
  Administered 2015-09-16: 0.5 mg via INTRAVENOUS
  Filled 2015-09-16: qty 1

## 2015-09-16 MED ORDER — WARFARIN SODIUM 7.5 MG PO TABS
7.5000 mg | ORAL_TABLET | ORAL | Status: DC
  Administered 2015-09-16: 7.5 mg via ORAL

## 2015-09-16 MED ORDER — FUROSEMIDE 20 MG PO TABS
20.0000 mg | ORAL_TABLET | Freq: Every day | ORAL | Status: DC
  Administered 2015-09-17 – 2015-09-18 (×2): 20 mg via ORAL
  Filled 2015-09-16 (×3): qty 1

## 2015-09-16 MED ORDER — SODIUM CHLORIDE 0.9 % IV SOLN
INTRAVENOUS | Status: AC
  Administered 2015-09-16: 13:00:00 via INTRAVENOUS

## 2015-09-16 MED ORDER — WARFARIN SODIUM 5 MG PO TABS
5.0000 mg | ORAL_TABLET | ORAL | Status: DC
  Administered 2015-09-16 – 2015-09-17 (×2): 5 mg via ORAL
  Filled 2015-09-16 (×2): qty 1

## 2015-09-16 NOTE — Progress Notes (Signed)
   09/16/15 1423  Clinical Encounter Type  Visited With Patient and family together;Health care provider  Visit Type Initial;Spiritual support  Referral From [place] was requested by the family to visit. Chaplain met with patient, patient's daughter [name] Flow, and granddaughter Delana Meyer. Family is feeling some stress with caring for both the patient and patient's wife as they experience dementia. Chaplain offered support and prayer, and our services are available as needed.   Jeri Lager, Chaplain 09/16/2015 2:25 PM

## 2015-09-16 NOTE — Progress Notes (Signed)
ANTICOAGULATION CONSULT NOTE - Follow up [facility] for Coumadin Indication: Hx of  pulmonary embolus  Allergies  Allergen Reactions  . Aricept [Donepezil Hcl] Other (See Comments)    Symptomatic Bradycardia.  . Other Other (See Comments)    Shrimp gives him gout    Patient Measurements: Height: 6' (182.9 cm) Weight: 151 lb 1.6 oz (68.539 kg) IBW/kg (Calculated) : 77.6  Vital Signs: Temp: 98.8 F (37.1 C) (12/27 0625) Temp Source: Oral (12/27 0625) BP: 136/62 mmHg (12/27 0625) Pulse Rate: 58 (12/27 0625)  Labs:  Recent Labs  09/15/15 1242 09/15/15 1526 09/16/15 0634  HGB 12.4*  --  12.7*  HCT 38.7*  --  39.8  PLT 164  --  168  LABPROT  --  24.5*  --   INR  --  2.23*  --   CREATININE 1.00  --  1.01    Estimated Creatinine Clearance: 54.6 mL/min (by C-G formula based on Cr of 1.01).   Medical History: Past Medical History  Diagnosis Date  . PE (pulmonary embolism)     unprovoked PE completed 6 months of warfarin, warfarin d/ced 02/12/2010, repeat PE 07/10/2010 after a long car ride and now on lifelong coumadin.  [name] Hypertension   . Pituitary microadenoma ([place])     incidental finding CT 12/2009  . Prostate cancer ([place])   . Depression   . Hyperlipidemia   . Coronary artery disease, non-occlusive     with history of MI; Cath 2008 w multivessel nonobstructive CAD  . Dementia   . GERD (gastroesophageal reflux disease)   . Scoliosis   . Anxiety     Medications:  Prescriptions prior to admission  Medication Sig Dispense Refill Last Dose  . acetaminophen (TYLENOL) 500 MG tablet Take 500 mg by mouth every 4 (four) hours as needed for mild pain or headache.   09/15/2015 at Unknown time  . amLODipine (NORVASC) 2.5 MG tablet TAKE 1 TABLET (2.5 MG TOTAL) BY MOUTH DAILY. 90 tablet 3 09/15/2015 at Unknown time  . atorvastatin (LIPITOR) 40 MG tablet TAKE 1 TABLET (40 MG TOTAL) BY MOUTH EVERY EVENING. 90 tablet 1 09/14/2015 at Unknown time  . b complex  vitamins tablet Take 1 tablet by mouth daily.   09/15/2015 at Unknown time  . Calcium Carbonate-Vitamin D (CALCIUM-VITAMIN D) 500-200 MG-UNIT per tablet Take 1 tablet by mouth daily.   09/15/2015 at Unknown time  . carbidopa-levodopa (SINEMET IR) 25-100 MG per tablet Take 1 tablet by mouth 2 (two) times daily. 60 tablet 3 09/15/2015 at Unknown time  . fish oil-omega-3 fatty acids 1000 MG capsule Take 2 g by mouth daily.    09/15/2015 at Unknown time  . furosemide (LASIX) 20 MG tablet Take 1 tablet (20 mg total) by mouth daily.   09/15/2015 at Unknown time  . memantine (NAMENDA) 10 MG tablet Take 1 tablet (10 mg total) by mouth 2 (two) times daily. 60 tablet 3 09/15/2015 at Unknown time  . Multiple Vitamins-Minerals (MULTIVITAMIN WITH MINERALS) tablet Take 1 tablet by mouth daily.   09/15/2015 at Unknown time  . omeprazole (PRILOSEC) 20 MG capsule TAKE 1 CAPSULE (20 MG TOTAL) BY MOUTH DAILY. 90 capsule 0 09/15/2015 at Unknown time  . potassium chloride (K-DUR) 10 MEQ tablet Take 1 tablet (10 mEq total) by mouth as directed. 1st dose take 2 tabs = 20 meq then decrease to 1 tab = 10 meq daily (Patient taking differently: Take 10 mEq by mouth daily. ) 90 tablet 3  09/15/2015 at Unknown time  . vitamin C (ASCORBIC ACID) 500 MG tablet Take 500 mg by mouth daily.   09/15/2015 at Unknown time  . warfarin (COUMADIN) 5 MG tablet Take as directed. (Patient taking differently: Take 5-7.5 mg by mouth daily. 7.5mg  on Monday and Thursday and 5 mg all other days) 40 tablet 2 09/14/2015 at 1800  . aspirin EC 81 MG EC tablet Take 1 tablet (81 mg total) by mouth daily.   2 months  . traZODone (DESYREL) 50 MG tablet Take 1 tablet (50 mg total) by mouth at bedtime. 30 tablet 2 Taking    Assessment: 81 y.o. male admitted with weakness/abdomnla pain, h/o PE on coumadin. Coumadin 5mg  daily exc 7.5mg  on Mon and Thurs for h/o PE. INR on admit was therapeutic at 2.23 yesterday evening. Hgb 12.7, plts wnl. No s/s of  bleed  Goal of Therapy:  INR 2-3 Monitor platelets by anticoagulation protocol: Yes   Plan:  Continue PTA coumadin 5mg  daily exc 7.5mg  on Mon and Thurs Monitor daily INR, CBC, s/s of bleed  [name], PharmD, [facility] Clinical Pharmacist Pager [phone] 09/16/2015 9:21 AM

## 2015-09-16 NOTE — Progress Notes (Signed)
Carelink Summary Report / Loop Recorder 

## 2015-09-16 NOTE — Progress Notes (Signed)
Subjective: Patient seen and examined this morning on rounds.  No acute events overnight since admission.  Daughter in the room provides most of the history.  Still no BM since admission.  Agitated overnight and attempted Risperidone but patient spit this out.  Was then given Ativan 0.5mg  IV.  Patient sleeping this morning on rounds, difficult to arouse.  Daughter reports no nausea or vomiting.  Objective: Vital signs in last 24 hours: Filed Vitals:   09/15/15 1900 09/15/15 1930 09/15/15 2107 09/16/15 0625  BP: 171/91 167/119 184/104 136/62  Pulse: 73 76 66 58  Temp:   99 F (37.2 C) 98.8 F (37.1 C)  TempSrc:   Oral Oral  Resp: 15 16 18 14   Height:   6' (1.829 m)   Weight:   151 lb 1.6 oz (68.539 kg)   SpO2: 97% 96% 99% 98%   Weight change:   Intake/Output Summary (Last 24 hours) at 09/16/15 1213 Last data filed at 09/16/15 0640  Gross per 24 hour  Intake  532.5 ml  Output    125 ml  Net  407.5 ml   General: sleeping in bed, no distress HEENT: NCAT Cardiac: RRR, no rubs, murmurs or gallops Pulm: clear to auscultation bilaterally, moving normal volumes of air Abd: soft, nontender, nondistended, BS present Ext: warm and well perfused, no pedal edema  Lab Results: Basic Metabolic Panel:  Recent Labs Lab 09/15/15 1242 09/16/15 0634  NA 143 142  K 3.5 3.2*  CL 110 106  CO2 22 26  GLUCOSE 103* 86  BUN 15 8  CREATININE 1.00 1.01  CALCIUM 9.2 9.0   Liver Function Tests:  Recent Labs Lab 09/15/15 1242 09/16/15 0634  AST 75* 72*  ALT 20 69*  ALKPHOS 59 57  BILITOT 0.7 1.3*  PROT 7.4 7.0  ALBUMIN 3.9 3.3*    Recent Labs Lab 09/15/15 1242  LIPASE 31   No results for input(s): AMMONIA in the last 168 hours. CBC:  Recent Labs Lab 09/15/15 1242 09/16/15 0634  WBC 3.2* 3.9*  HGB 12.4* 12.7*  HCT 38.7* 39.8  MCV 93.5 93.0  PLT 164 168   Cardiac Enzymes: No results for input(s): CKTOTAL, CKMB, CKMBINDEX, TROPONINI in the last 168 hours. BNP: No  results for input(s): PROBNP in the last 168 hours. D-Dimer: No results for input(s): DDIMER in the last 168 hours. CBG:  Recent Labs Lab 09/15/15 1247  GLUCAP 154*   Hemoglobin A1C: No results for input(s): HGBA1C in the last 168 hours. Fasting Lipid Panel: No results for input(s): CHOL, HDL, LDLCALC, TRIG, CHOLHDL, LDLDIRECT in the last 168 hours. Thyroid Function Tests: No results for input(s): TSH, T4TOTAL, FREET4, T3FREE, THYROIDAB in the last 168 hours. Coagulation:  Recent Labs Lab 09/15/15 1526  LABPROT 24.5*  INR 2.23*   Anemia Panel: No results for input(s): VITAMINB12, FOLATE, FERRITIN, TIBC, IRON, RETICCTPCT in the last 168 hours. Urine Drug Screen: Drugs of Abuse     Component Value Date/Time   LABOPIA NONE DETECTED 08/11/2011 2331   COCAINSCRNUR NONE DETECTED 08/11/2011 2331   LABBENZ NONE DETECTED 08/11/2011 2331   AMPHETMU NONE DETECTED 08/11/2011 2331   THCU NONE DETECTED 08/11/2011 2331   LABBARB NONE DETECTED 08/11/2011 2331    Alcohol Level: No results for input(s): ETH in the last 168 hours. Urinalysis:  Recent Labs Lab 09/15/15 1709  COLORURINE YELLOW  LABSPEC 1.018  PHURINE 6.5  GLUCOSEU NEGATIVE  HGBUR TRACE*  BILIRUBINUR NEGATIVE  KETONESUR NEGATIVE  PROTEINUR 30*  NITRITE NEGATIVE  LEUKOCYTESUR NEGATIVE     Micro Results: Recent Results (from the past 240 hour(s))  MRSA PCR Screening     Status: None   Collection Time: 09/15/15  9:55 AM  Result Value Ref Range Status   MRSA by PCR NEGATIVE NEGATIVE Final    Comment:        The GeneXpert MRSA Assay (FDA approved for NASAL specimens only), is one component of a comprehensive MRSA colonization surveillance program. It is not intended to diagnose MRSA infection nor to guide or monitor treatment for MRSA infections.    Studies/Results: Dg Chest 2 View  09/15/2015  CLINICAL DATA:  Weakness EXAM: CHEST  2 VIEW COMPARISON:  12/13/2014 FINDINGS: Cardiomediastinal  silhouette is stable. There is small left pleural prior left basilar atelectasis or infiltrate. No pulmonary edema. IMPRESSION: Small left pleural effusion left basilar atelectasis or infiltrate. No pulmonary edema. Electronically Signed   By: Lahoma Crocker M.D.   On: 09/15/2015 15:57   Ct Abdomen Pelvis W Contrast  09/15/2015  CLINICAL DATA:  Generalized abdominal pain. EXAM: CT ABDOMEN AND PELVIS WITH CONTRAST TECHNIQUE: Multidetector CT imaging of the abdomen and pelvis was performed using the standard protocol following bolus administration of intravenous contrast. CONTRAST:  145mL OMNIPAQUE IOHEXOL 300 MG/ML SOLN, 88mL OMNIPAQUE IOHEXOL 300 MG/ML SOLN COMPARISON:  CT abdomen and pelvis 06/01/2013. FINDINGS: Lower chest: Linear airspace opacities are present at the left base. There is mild atelectasis at the right base. Coronary artery calcifications are again seen. The heart size is normal. There are no significant pleural or pericardial effusions. Hepatobiliary: The liver, common bile duct, and gallbladder are within normal limits. Pancreas: Negative Spleen: Within normal limits Adrenals/Urinary Tract: A 3.5 cm benign appearing cyst at the lower pole of the right kidney is slightly larger than on the prior study. No other focal lesions are present. The ureters are within normal limits. The urinary bladder is unremarkable. Stomach/Bowel: The stomach is moderately distended. Fluid levels in mild distention are present in the proximal small bowel. There is a relative transition point in the mid lower abdomen. Extensive stool is present throughout the colon to the level of the rectosigmoid colon which demonstrates gaseous distension to the rectum. There is some fluid at the rectum. No obstructing lesion is present. Surgical clips are present about about the prostate. Vascular/Lymphatic: Extensive atherosclerotic calcifications are present without aneurysm. There is no significant adenopathy. Reproductive:  Prostatectomy. Other: No significant free fluid is present. Musculoskeletal: Rightward curvature of the lumbar spine is centered at L2-3. There is right lateral listhesis at L3-4 and L4-5. Multilevel foraminal disease is due to facet hypertrophy and endplate osteophyte formation. IMPRESSION: 1. Moderate distention of the stomach and proximal small bowel fluid levels. This is compatible with a ileus or a partial distal small bowel obstruction. There may be a transition point in low abdomen. No associated mass lesion is present. 2. Moderate distention of the colon with extensive stool to the level of the sigmoid colon. 3. Extensive atherosclerotic disease including coronary artery disease. 4. Bibasilar atelectasis, left greater than right. 5. Scoliosis and multilevel foraminal narrowing. Electronically Signed   By: San Morelle M.D.   On: 09/15/2015 16:59   Medications:  Scheduled Meds: . amLODipine  2.5 mg Oral Daily  . aspirin EC  81 mg Oral Daily  . atorvastatin  40 mg Oral q1800  . furosemide  20 mg Oral Daily  . warfarin  5 mg Oral Once per day on Sun Tue Wed  Fri Sat   And  . [START ON 09/18/2015] warfarin  7.5 mg Oral Once per day on Mon Thu  . Warfarin - Pharmacist Dosing Inpatient   Does not apply q1800   Continuous Infusions:  PRN Meds:.acetaminophen, ondansetron (ZOFRAN) IV Assessment/Plan: Principal Problem:   Ileus ([place]) Active Problems:   HLD (hyperlipidemia)   Essential hypertension, benign   Pulmonary embolism (HCC)   Constipation   CAD (coronary artery disease)   Normocytic anemia   Chronic systolic heart failure (HCC)   Diarrhea  Ileus vs SBO: no BM since admission, no nausea or vomiting  - Advance to clears - NS at 75ml/hr  - Zofran prn nausea - NGT if starts to have emesis - repeat abdominal imaging tomorrow if not improved.  Sooner if worsens - monitor electrolytes, replete as needed - Tylenol prn pain - Monitor for BM  Agitation - agitated overnight  last evening that responded to Ativan but patient more somnolent today - if agitiated try redirection first.  If needed can try Risperidone   History of Pulmonary Emboli: Hx unprovoked PE in 2010 and completed 6 months of coumadin therapy. Then after coumadin discontinued, he had another PE in Oct 2011 and has been on coumadin since that time. His INR was therapeutic on admission at 2.23.  - Continue coumadin   Normocytic Anemia: Hgb 12.4 on admission. His baseline Hgb is in the low 12 range. He has never had a colonoscopy but stool cards negative this year. No active bleeding.  - Continue to monitor   HTN: BP elevated in [id number] systolic on admission. Could be elevated secondary to pain. He is on Amlodipine 2.5 mg daily and Lasix 20 mg daily at home.  - Continue home meds   CAD: No chest pain. Hx of MI in 2008. Last cardiac cath in March 2016 with single vessel disease in the LAD.  - Continue ASA, statin  Hyperlipidemia:  - Continue home Atorvastatin 40 mg daily   Diet: Clears  VTE PPx: Coumadin   Dispo: Disposition is deferred at this time, awaiting improvement of current medical problems.  Anticipated discharge in approximately 1-2 day(s).   The patient does have a current PCP Francesca Oman, DO) and does need an [facility] hospital follow-up appointment after discharge.  The patient does have transportation limitations that hinder transportation to clinic appointments.  .Services Needed at time of discharge: Y = Yes, Blank = No PT:   OT:   RN:   Equipment:   Other:     LOS: 1 day   Jule Ser, DO 09/16/2015, 12:13 PM

## 2015-09-16 NOTE — Progress Notes (Addendum)
Pt was agitated and pulled out his IV catheter. He pulled his condom catheter out twice. MD on call was notified. He ordered Risperidone PO, which the pt spat out he refused to take. A new IV was successfully restarted on the pt and the condom catheter was successfully replaced. Thereafter, the MD ordered 0.5mg  Ativan IV was ordered after the new IV site was established.  Pt is currently sleeping, we will continue to monitor. Pt was brady some of the night, as well as over 50's during the night. MD notified.

## 2015-09-16 NOTE — Care Management Note (Addendum)
Case Management Note  Patient Details  Name: [name] [name] MRN: [id number] Date of Birth: [birth year]  Subjective/Objective:                 Date- 09-16-15 Initial Assessment Spoke with patient at the bedside along with daughter [name] Flow and son [name].  Introduced self as Tourist information centre manager and explained role in discharge planning and how to be reached.  Verified patient lives in [place] with daughter [name] in a house  Verified patient anticipates to go home with family at time of discharge and will have full-time supervision by family at this time to best of their knowledge.  Patient has DME walker  wheelchair. Expressed potential need for 3 in 1.  Patient denied needing help with their medication.  Patient is driven by daughter or other sibling to MD appointments.  Verified patient has PCP Dr Redmond Pulling. Patient states they currently receive [place] services through [place], for PT OT.    Plan: CM will continue to follow for discharge planning and [facility] resources.   Carles Collet RN BSN CM [phone]    Action/Plan:  Anticipate need for [facility] to resume through [facility] and DME of 3 in 1, referrals made.  Expected Discharge Date:                  Expected Discharge Plan:  [place]  In-House Referral:     Discharge planning Services  CM Consult  Post Acute Care Choice:    Choice offered to:     DME Arranged:    DME Agency:     HH Arranged:    [place] Agency:     Status of Service:  In process, will continue to follow  Medicare Important Message Given:    Date Medicare IM Given:    Medicare IM give by:    Date Additional Medicare IM Given:    Additional Medicare Important Message give by:     If discussed at [place] of Stay Meetings, dates discussed:    Additional Comments:  Carles Collet, RN 09/16/2015, 3:30 PM

## 2015-09-17 LAB — BASIC METABOLIC PANEL
Anion gap: 9 (ref 5–15)
BUN: 9 mg/dL (ref 6–20)
CHLORIDE: 107 mmol/L (ref 101–111)
CO2: 23 mmol/L (ref 22–32)
Calcium: 8.9 mg/dL (ref 8.9–10.3)
Creatinine, Ser: 0.95 mg/dL (ref 0.61–1.24)
GFR calc Af Amer: >60 mL/min (ref >60)
GFR calc non Af Amer: >60 mL/min (ref >60)
GLUCOSE: 80 mg/dL (ref 65–99)
POTASSIUM: 3.6 mmol/L (ref 3.5–5.1)
Sodium: 139 mmol/L (ref 135–145)

## 2015-09-17 LAB — PROTIME-INR
INR: 2.33 — ABNORMAL HIGH (ref 0.00–1.49)
Prothrombin Time: 25.3 seconds — ABNORMAL HIGH (ref 11.6–15.2)

## 2015-09-17 LAB — MAGNESIUM: Magnesium: 1.6 mg/dL — ABNORMAL LOW (ref 1.7–2.4)

## 2015-09-17 MED ORDER — MAGNESIUM SULFATE 4 GM/100ML IV SOLN
4.0000 g | Freq: Once | INTRAVENOUS | Status: AC
  Administered 2015-09-17: 4 g via INTRAVENOUS
  Filled 2015-09-17: qty 100

## 2015-09-17 NOTE — Progress Notes (Signed)
ANTICOAGULATION CONSULT NOTE - Follow up [place] for Coumadin Indication: Hx of  pulmonary embolus  Allergies  Allergen Reactions  . Aricept [Donepezil Hcl] Other (See Comments)    Symptomatic Bradycardia.  . Other Other (See Comments)    Shrimp gives him gout    Patient Measurements: Height: 6' (182.9 cm) Weight: 151 lb 1.6 oz (68.539 kg) IBW/kg (Calculated) : 77.6  Vital Signs: Temp: 98.8 F (37.1 C) (12/28 0645) Temp Source: Oral (12/27 2245) BP: 143/80 mmHg (12/28 0645) Pulse Rate: 62 (12/28 0645)  Labs:  Recent Labs  09/15/15 1242 09/15/15 1526 09/16/15 0634 09/17/15 0528  HGB 12.4*  --  12.7*  --   HCT 38.7*  --  39.8  --   PLT 164  --  168  --   LABPROT  --  24.5*  --  25.3*  INR  --  2.23*  --  2.33*  CREATININE 1.00  --  1.01 0.95    Estimated Creatinine Clearance: 58.1 mL/min (by C-G formula based on Cr of 0.95).   Medical History: Past Medical History  Diagnosis Date  . PE (pulmonary embolism)     unprovoked PE completed 6 months of warfarin, warfarin d/ced 02/12/2010, repeat PE 07/10/2010 after a long car ride and now on lifelong coumadin.  [name] Hypertension   . Pituitary microadenoma ([place])     incidental finding CT 12/2009  . Prostate cancer ([place])   . Depression   . Hyperlipidemia   . Coronary artery disease, non-occlusive     with history of MI; Cath 2008 w multivessel nonobstructive CAD  . Dementia   . GERD (gastroesophageal reflux disease)   . Scoliosis   . Anxiety     Medications:  Prescriptions prior to admission  Medication Sig Dispense Refill Last Dose  . acetaminophen (TYLENOL) 500 MG tablet Take 500 mg by mouth every 4 (four) hours as needed for mild pain or headache.   09/15/2015 at Unknown time  . amLODipine (NORVASC) 2.5 MG tablet TAKE 1 TABLET (2.5 MG TOTAL) BY MOUTH DAILY. 90 tablet 3 09/15/2015 at Unknown time  . atorvastatin (LIPITOR) 40 MG tablet TAKE 1 TABLET (40 MG TOTAL) BY MOUTH EVERY EVENING. 90 tablet  1 09/14/2015 at Unknown time  . b complex vitamins tablet Take 1 tablet by mouth daily.   09/15/2015 at Unknown time  . Calcium Carbonate-Vitamin D (CALCIUM-VITAMIN D) 500-200 MG-UNIT per tablet Take 1 tablet by mouth daily.   09/15/2015 at Unknown time  . carbidopa-levodopa (SINEMET IR) 25-100 MG per tablet Take 1 tablet by mouth 2 (two) times daily. 60 tablet 3 09/15/2015 at Unknown time  . fish oil-omega-3 fatty acids 1000 MG capsule Take 2 g by mouth daily.    09/15/2015 at Unknown time  . furosemide (LASIX) 20 MG tablet Take 1 tablet (20 mg total) by mouth daily.   09/15/2015 at Unknown time  . memantine (NAMENDA) 10 MG tablet Take 1 tablet (10 mg total) by mouth 2 (two) times daily. 60 tablet 3 09/15/2015 at Unknown time  . Multiple Vitamins-Minerals (MULTIVITAMIN WITH MINERALS) tablet Take 1 tablet by mouth daily.   09/15/2015 at Unknown time  . omeprazole (PRILOSEC) 20 MG capsule TAKE 1 CAPSULE (20 MG TOTAL) BY MOUTH DAILY. 90 capsule 0 09/15/2015 at Unknown time  . potassium chloride (K-DUR) 10 MEQ tablet Take 1 tablet (10 mEq total) by mouth as directed. 1st dose take 2 tabs = 20 meq then decrease to 1 tab = 10 meq  daily (Patient taking differently: Take 10 mEq by mouth daily. ) 90 tablet 3 09/15/2015 at Unknown time  . vitamin C (ASCORBIC ACID) 500 MG tablet Take 500 mg by mouth daily.   09/15/2015 at Unknown time  . warfarin (COUMADIN) 5 MG tablet Take as directed. (Patient taking differently: Take 5-7.5 mg by mouth daily. 7.5mg  on Monday and Thursday and 5 mg all other days) 40 tablet 2 09/14/2015 at 1800  . aspirin EC 81 MG EC tablet Take 1 tablet (81 mg total) by mouth daily.   2 months  . traZODone (DESYREL) 50 MG tablet Take 1 tablet (50 mg total) by mouth at bedtime. 30 tablet 2 Taking    Assessment: 81 y.o. male admitted with weakness/abdomnla pain, h/o PE on coumadin. Coumadin 5mg  daily exc 7.5mg  on Mon and Thurs for h/o PE. INR on admit was therapeutic at 2.23 yesterday evening.  INR remains therapeutic at 2.33 today. Hgb 12.7, plts wnl. No s/s of bleed  Goal of Therapy:  INR 2-3 Monitor platelets by anticoagulation protocol: Yes   Plan:  Continue PTA coumadin 5mg  daily exc 7.5mg  on Mon and Thurs Monitor daily INR, CBC, s/s of bleed  [name], PharmD, [facility] Clinical Pharmacist Pager [phone] 09/17/2015 8:42 AM

## 2015-09-17 NOTE — Evaluation (Signed)
Physical Therapy Evaluation Patient Details Name: [name] [name] MRN: [id number] DOB: [birth year] Today's Date: 09/17/2015   History of Present Illness  81 y/o male with diarrhea/abd pain * 1 week with CT revealing ileus v/s partial SBO. Patient with benign abd exam and positive BS. No n/v currently. Patient with likely ileus.   Clinical Impression  Pt admitted with above diagnosis. Pt currently with functional limitations due to the deficits listed below (see PT Problem List).  Pt will benefit from skilled PT to increase their independence and safety with mobility to allow discharge to the venue listed below.  Pt lethargic at eval and ambulated around bed with MOD A plus son nearby for safety.  Discussed if pt can handle him at current level and they feel they can, and they are able to provide 24 hour S. They state that he clears up when he is at home and always has difficulty when in hospital. They feel they can A him up the stairs to enter home and to get to bedroom.  Recommend HHPT and family in agreement.     Follow Up Recommendations Home health PT    Equipment Recommendations  None recommended by PT    Recommendations for Other Services       Precautions / Restrictions Precautions Precautions: Fall      Mobility  Bed Mobility Overal bed mobility: +2 for physical assistance;Needs Assistance Bed Mobility: Supine to Sit     Supine to sit: Min assist;+2 for physical assistance     General bed mobility comments: Pt's son assisting PT.  Daughter educated on use of bed pad underneath to help move pt to EOB.  Transfers Overall transfer level: Needs assistance Equipment used: Rolling walker (2 wheeled) Transfers: Sit to/from Stand Sit to Stand: Mod assist         General transfer comment: Heavy lean to the L while daughter cleaned pt in standing  Ambulation/Gait Ambulation/Gait assistance: Mod assist;+2 safety/equipment Ambulation Distance (Feet): 10 Feet (around bed to  recliner) Assistive device: Rolling walker (2 wheeled) Gait Pattern/deviations: Decreased step length - right;Decreased step length - left;Shuffle     General Gait Details: Flexed knees and Parkinsonian type gait.  MOD A for maneuvering RW and for weight shifts with turning and positioning self in front of recliner.  Son nearby for safety.  Stairs            Wheelchair Mobility    Modified Rankin (Stroke Patients Only)       Balance Overall balance assessment: Needs assistance   Sitting balance-Leahy Scale: Poor   Postural control: Left lateral lean Standing balance support: Bilateral upper extremity supported Standing balance-Leahy Scale: Poor Standing balance comment: Heavy lean to L                             Pertinent Vitals/Pain Pain Assessment: No/denies pain    Home Living Family/patient expects to be discharged to:: Private residence Living Arrangements: Spouse/significant other;Children Available Help at Discharge: Family;Available 24 hours/day;Personal care attendant   Home Access: Stairs to enter Entrance Stairs-Rails: Right;Left Entrance Stairs-Number of Steps: 4 Home Layout: Two level;Bed/bath upstairs Home Equipment: Walker - 2 wheels;Bedside commode;Wheelchair - manual      Prior Function Level of Independence: Independent with assistive device(s)               Hand Dominance        Extremity/Trunk Assessment   Upper Extremity Assessment: Generalized weakness  Lower Extremity Assessment: Generalized weakness      Cervical / Trunk Assessment: Normal  Communication      Cognition Arousal/Alertness: Lethargic Behavior During Therapy: Flat affect Overall Cognitive Status: History of cognitive impairments - at baseline                      General Comments      Exercises        Assessment/Plan    PT Assessment Patient needs continued PT services  PT Diagnosis Generalized  weakness;Difficulty walking   PT Problem List Decreased strength;Decreased activity tolerance;Decreased balance;Decreased mobility;Decreased coordination  PT Treatment Interventions DME instruction;Gait training;Functional mobility training;Therapeutic activities;Therapeutic exercise;Balance training;Patient/family education   PT Goals (Current goals can be found in the Care Plan section) Acute Rehab PT Goals Patient Stated Goal: Family goal is to take pt home PT Goal Formulation: With family Time For Goal Achievement: 09/24/15 Potential to Achieve Goals: Fair    Frequency Min 3X/week   Barriers to discharge        Co-evaluation               End of Session Equipment Utilized During Treatment: Gait belt Activity Tolerance: Patient limited by lethargy Patient left: in chair;with call bell/phone within reach;with chair alarm set;with family/visitor present Nurse Communication: Mobility status         Time: 1151-1223 PT Time Calculation (min) (ACUTE ONLY): 32 min   Charges:   PT Evaluation $Initial PT Evaluation Tier I: 1 Procedure PT Treatments $Gait Training: 8-22 mins   PT G Codes:        Tyrease Vandeberg LUBECK 09/17/2015, 1:05 PM

## 2015-09-17 NOTE — Progress Notes (Signed)
Patient seen and examined. Case d/w residents in detail. I agree with findings and plan as documented in Dr. Sherlynn Carbon note.  Patient was admitted for abd pain and diarrhea nad found to have ileus v/s partial SBO. He was able to tolerate clear liquids yesterday but still has some watery BMs. His abd is soft with good BS at this time.  His ileus appears to be resolving clinically. Would advance diet as tolerated and monitor. Possibly d/c home in AM if continues to improve.

## 2015-09-17 NOTE — Progress Notes (Signed)
Subjective: Patient seen and examined at bedside. No acute events overnight Able to tolerate clear liquids and willing to try to advance the diet No agitation overnight  Daughter present in the room when examined.   Objective: Vital signs in last 24 hours: Filed Vitals:   09/16/15 0625 09/16/15 1305 09/16/15 2245 09/17/15 0645  BP: 136/62 146/77 137/91 143/80  Pulse: 58 36 62 62  Temp: 98.8 F (37.1 C) 97.8 F (36.6 C) 98.2 F (36.8 C) 98.8 F (37.1 C)  TempSrc: Oral Oral Oral   Resp: 14 20 13 14   Height:      Weight:      SpO2: 98% 100% 97% 100%   Weight change:   Intake/Output Summary (Last 24 hours) at 09/17/15 1020 Last data filed at 09/17/15 0949  Gross per 24 hour  Intake 1286.75 ml  Output   1075 ml  Net 211.75 ml   General: sleeping in bed, no distress Cardiac: RRR, no rubs, murmurs or gallops Pulm: clear to auscultation bilaterally, moving normal volumes of air Abd: soft, nontender, nondistended, BS present in all 4 quadrants  Ext: warm and well perfused, no pedal edema  Lab Results: Basic Metabolic Panel:  Recent Labs Lab 09/16/15 0634 09/17/15 0528  NA 142 139  K 3.2* 3.6  CL 106 107  CO2 26 23  GLUCOSE 86 80  BUN 8 9  CREATININE 1.01 0.95  CALCIUM 9.0 8.9   Liver Function Tests:  Recent Labs Lab 09/15/15 1242 09/16/15 0634  AST 75* 72*  ALT 20 69*  ALKPHOS 59 57  BILITOT 0.7 1.3*  PROT 7.4 7.0  ALBUMIN 3.9 3.3*    Recent Labs Lab 09/15/15 1242  LIPASE 31   No results for input(s): AMMONIA in the last 168 hours. CBC:  Recent Labs Lab 09/15/15 1242 09/16/15 0634  WBC 3.2* 3.9*  HGB 12.4* 12.7*  HCT 38.7* 39.8  MCV 93.5 93.0  PLT 164 168   Cardiac Enzymes: No results for input(s): CKTOTAL, CKMB, CKMBINDEX, TROPONINI in the last 168 hours. BNP: No results for input(s): PROBNP in the last 168 hours. D-Dimer: No results for input(s): DDIMER in the last 168 hours. CBG:  Recent Labs Lab 09/15/15 1247  GLUCAP  154*   Hemoglobin A1C: No results for input(s): HGBA1C in the last 168 hours. Fasting Lipid Panel: No results for input(s): CHOL, HDL, LDLCALC, TRIG, CHOLHDL, LDLDIRECT in the last 168 hours. Thyroid Function Tests: No results for input(s): TSH, T4TOTAL, FREET4, T3FREE, THYROIDAB in the last 168 hours. Coagulation:  Recent Labs Lab 09/15/15 1526 09/17/15 0528  LABPROT 24.5* 25.3*  INR 2.23* 2.33*   Anemia Panel: No results for input(s): VITAMINB12, FOLATE, FERRITIN, TIBC, IRON, RETICCTPCT in the last 168 hours. Urine Drug Screen: Drugs of Abuse     Component Value Date/Time   LABOPIA NONE DETECTED 08/11/2011 2331   COCAINSCRNUR NONE DETECTED 08/11/2011 2331   LABBENZ NONE DETECTED 08/11/2011 2331   AMPHETMU NONE DETECTED 08/11/2011 2331   THCU NONE DETECTED 08/11/2011 2331   LABBARB NONE DETECTED 08/11/2011 2331    Alcohol Level: No results for input(s): ETH in the last 168 hours. Urinalysis:  Recent Labs Lab 09/15/15 1709  COLORURINE YELLOW  LABSPEC 1.018  PHURINE 6.5  GLUCOSEU NEGATIVE  HGBUR TRACE*  BILIRUBINUR NEGATIVE  KETONESUR NEGATIVE  PROTEINUR 30*  NITRITE NEGATIVE  LEUKOCYTESUR NEGATIVE     Micro Results: Recent Results (from the past 240 hour(s))  MRSA PCR Screening     Status: None  Collection Time: 09/15/15  9:55 AM  Result Value Ref Range Status   MRSA by PCR NEGATIVE NEGATIVE Final    Comment:        The GeneXpert MRSA Assay (FDA approved for NASAL specimens only), is one component of a comprehensive MRSA colonization surveillance program. It is not intended to diagnose MRSA infection nor to guide or monitor treatment for MRSA infections.    Studies/Results: Dg Chest 2 View  09/15/2015  CLINICAL DATA:  Weakness EXAM: CHEST  2 VIEW COMPARISON:  12/13/2014 FINDINGS: Cardiomediastinal silhouette is stable. There is small left pleural prior left basilar atelectasis or infiltrate. No pulmonary edema. IMPRESSION: Small left pleural  effusion left basilar atelectasis or infiltrate. No pulmonary edema. Electronically Signed   By: Lahoma Crocker M.D.   On: 09/15/2015 15:57   Ct Abdomen Pelvis W Contrast  09/15/2015  CLINICAL DATA:  Generalized abdominal pain. EXAM: CT ABDOMEN AND PELVIS WITH CONTRAST TECHNIQUE: Multidetector CT imaging of the abdomen and pelvis was performed using the standard protocol following bolus administration of intravenous contrast. CONTRAST:  161mL OMNIPAQUE IOHEXOL 300 MG/ML SOLN, 9mL OMNIPAQUE IOHEXOL 300 MG/ML SOLN COMPARISON:  CT abdomen and pelvis 06/01/2013. FINDINGS: Lower chest: Linear airspace opacities are present at the left base. There is mild atelectasis at the right base. Coronary artery calcifications are again seen. The heart size is normal. There are no significant pleural or pericardial effusions. Hepatobiliary: The liver, common bile duct, and gallbladder are within normal limits. Pancreas: Negative Spleen: Within normal limits Adrenals/Urinary Tract: A 3.5 cm benign appearing cyst at the lower pole of the right kidney is slightly larger than on the prior study. No other focal lesions are present. The ureters are within normal limits. The urinary bladder is unremarkable. Stomach/Bowel: The stomach is moderately distended. Fluid levels in mild distention are present in the proximal small bowel. There is a relative transition point in the mid lower abdomen. Extensive stool is present throughout the colon to the level of the rectosigmoid colon which demonstrates gaseous distension to the rectum. There is some fluid at the rectum. No obstructing lesion is present. Surgical clips are present about about the prostate. Vascular/Lymphatic: Extensive atherosclerotic calcifications are present without aneurysm. There is no significant adenopathy. Reproductive: Prostatectomy. Other: No significant free fluid is present. Musculoskeletal: Rightward curvature of the lumbar spine is centered at L2-3. There is right  lateral listhesis at L3-4 and L4-5. Multilevel foraminal disease is due to facet hypertrophy and endplate osteophyte formation. IMPRESSION: 1. Moderate distention of the stomach and proximal small bowel fluid levels. This is compatible with a ileus or a partial distal small bowel obstruction. There may be a transition point in low abdomen. No associated mass lesion is present. 2. Moderate distention of the colon with extensive stool to the level of the sigmoid colon. 3. Extensive atherosclerotic disease including coronary artery disease. 4. Bibasilar atelectasis, left greater than right. 5. Scoliosis and multilevel foraminal narrowing. Electronically Signed   By: San Morelle M.D.   On: 09/15/2015 16:59   Medications:  Scheduled Meds: . amLODipine  2.5 mg Oral Daily  . aspirin EC  81 mg Oral Daily  . atorvastatin  40 mg Oral q1800  . furosemide  20 mg Oral Daily  . warfarin  5 mg Oral Once per day on Sun Tue Wed Fri Sat   And  . [START ON 09/18/2015] warfarin  7.5 mg Oral Once per day on Mon Thu  . Warfarin - Pharmacist Dosing Inpatient  Does not apply q1800   Continuous Infusions:  PRN Meds:.acetaminophen, ondansetron (ZOFRAN) IV Assessment/Plan: Principal Problem:   Ileus ([place]) Active Problems:   HLD (hyperlipidemia)   Essential hypertension, benign   Pulmonary embolism (HCC)   Constipation   CAD (coronary artery disease)   Normocytic anemia   Chronic systolic heart failure (HCC)   Diarrhea  Ileus vs SBO: no BM since admission, no nausea or vomiting. He tolerated clear liquids  -advance diet as tolerated - Zofran prn nausea - monitor electrolytes, replete as needed - Tylenol prn pain - Monitor for BM  History of Pulmonary Emboli: Hx unprovoked PE in 2010 and completed 6 months of coumadin therapy. Then after coumadin discontinued, he had another PE in Oct 2011 and has been on coumadin since that time. His INR was therapeutic on admission at 2.23. And is therapeutic  today.  - Continue coumadin   Normocytic Anemia: Hgb 12.4 on admission. His baseline Hgb is in the low 12 range. He has never had a colonoscopy but stool cards negative this year. No active bleeding.  - Continue to monitor  HTN: BP elevated in [id number] systolic on admission. Could be elevated secondary to pain. He is on Amlodipine 2.5 mg daily and Lasix 20 mg daily at home.  Blood pressure has been stable over past 24 hours  - Continue home meds   Diet: advance to hh  VTE PPx: Coumadin   Dispo: Disposition is deferred at this time, awaiting improvement of current medical problems.  Anticipated discharge in approximately 1-2 day(s).   The patient does have a current PCP Francesca Oman, DO) and does need an [facility] hospital follow-up appointment after discharge.  The patient does have transportation limitations that hinder transportation to clinic appointments.  .Services Needed at time of discharge: Y = Yes, Blank = No PT:   OT:   RN:   Equipment:   Other:     LOS: 2 days   Burgess Estelle, MD 09/17/2015, 10:20 AM

## 2015-09-18 LAB — MAGNESIUM: Magnesium: 2 mg/dL (ref 1.7–2.4)

## 2015-09-18 LAB — PROTIME-INR
INR: 2.66 — AB (ref 0.00–1.49)
Prothrombin Time: 28 seconds — ABNORMAL HIGH (ref 11.6–15.2)

## 2015-09-18 LAB — BASIC METABOLIC PANEL
Anion gap: 11 (ref 5–15)
BUN: 14 mg/dL (ref 6–20)
CHLORIDE: 107 mmol/L (ref 101–111)
CO2: 23 mmol/L (ref 22–32)
Calcium: 9.1 mg/dL (ref 8.9–10.3)
Creatinine, Ser: 1.13 mg/dL (ref 0.61–1.24)
GFR calc Af Amer: >60 mL/min (ref >60)
GFR calc non Af Amer: 59 mL/min — ABNORMAL LOW (ref >60)
GLUCOSE: 102 mg/dL — AB (ref 65–99)
POTASSIUM: 3.5 mmol/L (ref 3.5–5.1)
Sodium: 141 mmol/L (ref 135–145)

## 2015-09-18 LAB — PHOSPHORUS: Phosphorus: 3.8 mg/dL (ref 2.5–4.6)

## 2015-09-18 LAB — GLUCOSE, CAPILLARY: Glucose-Capillary: 98 mg/dL (ref 65–99)

## 2015-09-18 MED ORDER — WARFARIN SODIUM 5 MG PO TABS: 5.0000 mg | ORAL_TABLET | ORAL | Status: DC

## 2015-09-18 MED ORDER — WARFARIN SODIUM 5 MG PO TABS: 5.0000 mg | ORAL_TABLET | Freq: Once | ORAL | Status: DC

## 2015-09-18 MED ORDER — WARFARIN SODIUM 7.5 MG PO TABS: 7.5000 mg | ORAL_TABLET | ORAL | Status: DC

## 2015-09-18 NOTE — Progress Notes (Signed)
ANTICOAGULATION CONSULT NOTE - Follow up [place] for Coumadin Indication: Hx of  pulmonary embolus  Allergies  Allergen Reactions  . Aricept [Donepezil Hcl] Other (See Comments)    Symptomatic Bradycardia.  . Other Other (See Comments)    Shrimp gives him gout    Patient Measurements: Height: 6' (182.9 cm) Weight: 151 lb 1.6 oz (68.539 kg) IBW/kg (Calculated) : 77.6  Vital Signs: Temp: 98 F (36.7 C) (12/29 0650) Temp Source: Oral (12/29 0650) BP: 156/93 mmHg (12/29 0650) Pulse Rate: 63 (12/29 0650)  Labs:  Recent Labs  09/15/15 1242 09/15/15 1526 09/16/15 0634 09/17/15 0528 09/18/15 0442  HGB 12.4*  --  12.7*  --   --   HCT 38.7*  --  39.8  --   --   PLT 164  --  168  --   --   LABPROT  --  24.5*  --  25.3* 28.0*  INR  --  2.23*  --  2.33* 2.66*  CREATININE 1.00  --  1.01 0.95  --     Estimated Creatinine Clearance: 58.1 mL/min (by C-G formula based on Cr of 0.95).   Medical History: Past Medical History  Diagnosis Date  . PE (pulmonary embolism)     unprovoked PE completed 6 months of warfarin, warfarin d/ced 02/12/2010, repeat PE 07/10/2010 after a long car ride and now on lifelong coumadin.  [name] Hypertension   . Pituitary microadenoma ([place])     incidental finding CT 12/2009  . Prostate cancer ([place])   . Depression   . Hyperlipidemia   . Coronary artery disease, non-occlusive     with history of MI; Cath 2008 w multivessel nonobstructive CAD  . Dementia   . GERD (gastroesophageal reflux disease)   . Scoliosis   . Anxiety     Medications:  Prescriptions prior to admission  Medication Sig Dispense Refill Last Dose  . acetaminophen (TYLENOL) 500 MG tablet Take 500 mg by mouth every 4 (four) hours as needed for mild pain or headache.   09/15/2015 at Unknown time  . amLODipine (NORVASC) 2.5 MG tablet TAKE 1 TABLET (2.5 MG TOTAL) BY MOUTH DAILY. 90 tablet 3 09/15/2015 at Unknown time  . atorvastatin (LIPITOR) 40 MG tablet TAKE 1 TABLET  (40 MG TOTAL) BY MOUTH EVERY EVENING. 90 tablet 1 09/14/2015 at Unknown time  . b complex vitamins tablet Take 1 tablet by mouth daily.   09/15/2015 at Unknown time  . Calcium Carbonate-Vitamin D (CALCIUM-VITAMIN D) 500-200 MG-UNIT per tablet Take 1 tablet by mouth daily.   09/15/2015 at Unknown time  . carbidopa-levodopa (SINEMET IR) 25-100 MG per tablet Take 1 tablet by mouth 2 (two) times daily. 60 tablet 3 09/15/2015 at Unknown time  . fish oil-omega-3 fatty acids 1000 MG capsule Take 2 g by mouth daily.    09/15/2015 at Unknown time  . furosemide (LASIX) 20 MG tablet Take 1 tablet (20 mg total) by mouth daily.   09/15/2015 at Unknown time  . memantine (NAMENDA) 10 MG tablet Take 1 tablet (10 mg total) by mouth 2 (two) times daily. 60 tablet 3 09/15/2015 at Unknown time  . Multiple Vitamins-Minerals (MULTIVITAMIN WITH MINERALS) tablet Take 1 tablet by mouth daily.   09/15/2015 at Unknown time  . omeprazole (PRILOSEC) 20 MG capsule TAKE 1 CAPSULE (20 MG TOTAL) BY MOUTH DAILY. 90 capsule 0 09/15/2015 at Unknown time  . potassium chloride (K-DUR) 10 MEQ tablet Take 1 tablet (10 mEq total) by mouth as directed.  1st dose take 2 tabs = 20 meq then decrease to 1 tab = 10 meq daily (Patient taking differently: Take 10 mEq by mouth daily. ) 90 tablet 3 09/15/2015 at Unknown time  . vitamin C (ASCORBIC ACID) 500 MG tablet Take 500 mg by mouth daily.   09/15/2015 at Unknown time  . warfarin (COUMADIN) 5 MG tablet Take as directed. (Patient taking differently: Take 5-7.5 mg by mouth daily. 7.5mg  on Monday and Thursday and 5 mg all other days) 40 tablet 2 09/14/2015 at 1800  . aspirin EC 81 MG EC tablet Take 1 tablet (81 mg total) by mouth daily.   2 months  . traZODone (DESYREL) 50 MG tablet Take 1 tablet (50 mg total) by mouth at bedtime. 30 tablet 2 Taking    Assessment: 81 y.o. male admitted with weakness/abdomnla pain, h/o PE on coumadin. Coumadin 5mg  daily exc 7.5mg  on Mon and Thurs for h/o PE. INR on  admit was therapeutic at 2.23. INR remains therapeutic at 2.66 today. Since INR trending up will plan to give 5mg  today. Patient may be discharged home today.  Goal of Therapy:  INR 2-3 Monitor platelets by anticoagulation protocol: Yes   Plan:  Give coumadin 5mg  PO x 1 tonight (instead of 7.5mg  dose) Then continue PTA coumadin 5mg  daily exc 7.5mg  on Mon and Thurs Monitor daily INR, CBC, s/s of bleed  [name], PharmD, [facility] Clinical Pharmacist Pager [phone] 09/18/2015 8:38 AM

## 2015-09-18 NOTE — Care Management Note (Signed)
Case Management Note  Patient Details  Name: [name] [name] MRN: [id number] Date of Birth: [birth year]  Subjective/Objective:                  Date- 09-16-15 Initial Assessment Spoke with patient at the bedside along with daughter [name] [name] and son [name] [name].  Introduced self as Tourist information centre manager and explained role in discharge planning and how to be reached.  Verified patient lives in [place] with daughter [name] [name] in a house  Verified patient anticipates to go home with family at time of discharge and will have full-time supervision by family at this time to best of their knowledge.  Patient has DME walker wheelchair. Expressed potential need for 3 in 1.  Patient denied needing help with their medication.  Patient is driven by daughter or other sibling to MD appointments.  Verified patient has PCP Dr Redmond Pulling. Patient states they currently receive [place] services through [facility], for PT OT.    Plan: CM will continue to follow for discharge planning and [facility] resources.   Carles Collet RN BSN CM [phone]    Action/Plan:  Anticipate need for [facility] to resume through [facility] and DME of 3 in 1, referrals made.    Expected Discharge Date:                  Expected Discharge Plan:  [place]  In-House Referral:     Discharge planning Services  CM Consult  Post Acute Care Choice:    Choice offered to:     DME Arranged:    DME Agency:     HH Arranged:  PT, OT HH Agency:  [place]  Status of Service:  Completed, signed off  Medicare Important Message Given:    Date Medicare IM Given:    Medicare IM give by:    Date Additional Medicare IM Given:    Additional Medicare Important Message give by:     If discussed at [place] of Stay Meetings, dates discussed:    Additional Comments:  Carles Collet, RN 09/18/2015, 12:10 PM

## 2015-09-18 NOTE — Progress Notes (Signed)
Subjective: Patient seen and examined at bedside. No acute events overnight He was eating breakfast when examined and daughter present as well. Daughter says he was able to tolerate some of the regular diet yesterday and it seems today also. He did not have any further episodes of diarrhea and had a formed BM. No abdominal pain, n/v.  Daughter present in the room when examined.   Objective: Vital signs in last 24 hours: Filed Vitals:   09/17/15 0645 09/17/15 1352 09/17/15 2137 09/18/15 0650  BP: 143/80 150/95 143/99 156/93  Pulse: 62 64 59 63  Temp: 98.8 F (37.1 C) 97.5 F (36.4 C) 98 F (36.7 C) 98 F (36.7 C)  TempSrc:  Oral  Oral  Resp: 14 19 18 18   Height:      Weight:      SpO2: 100% 99% 95% 100%   Weight change:   Intake/Output Summary (Last 24 hours) at 09/18/15 1005 Last data filed at 09/18/15 0900  Gross per 24 hour  Intake    220 ml  Output    600 ml  Net   -380 ml   General: eating breakfast in bed,  no distress Cardiac: RRR, no rubs, murmurs or gallops Pulm: clear to auscultation bilaterally, moving normal volumes of air Abd: soft, nontender, nondistended, BS present in all 4 quadrants  Ext: warm and well perfused, no pedal edema  Lab Results: Basic Metabolic Panel:  Recent Labs Lab 09/16/15 0634 09/17/15 0528 09/17/15 0950  NA 142 139  --   K 3.2* 3.6  --   CL 106 107  --   CO2 26 23  --   GLUCOSE 86 80  --   BUN 8 9  --   CREATININE 1.01 0.95  --   CALCIUM 9.0 8.9  --   MG  --   --  1.6*   Liver Function Tests:  Recent Labs Lab 09/15/15 1242 09/16/15 0634  AST 75* 72*  ALT 20 69*  ALKPHOS 59 57  BILITOT 0.7 1.3*  PROT 7.4 7.0  ALBUMIN 3.9 3.3*    Recent Labs Lab 09/15/15 1242  LIPASE 31   No results for input(s): AMMONIA in the last 168 hours. CBC:  Recent Labs Lab 09/15/15 1242 09/16/15 0634  WBC 3.2* 3.9*  HGB 12.4* 12.7*  HCT 38.7* 39.8  MCV 93.5 93.0  PLT 164 168   Cardiac Enzymes: No results for  input(s): CKTOTAL, CKMB, CKMBINDEX, TROPONINI in the last 168 hours. BNP: No results for input(s): PROBNP in the last 168 hours. D-Dimer: No results for input(s): DDIMER in the last 168 hours. CBG:  Recent Labs Lab 09/15/15 1247  GLUCAP 154*   Hemoglobin A1C: No results for input(s): HGBA1C in the last 168 hours. Fasting Lipid Panel: No results for input(s): CHOL, HDL, LDLCALC, TRIG, CHOLHDL, LDLDIRECT in the last 168 hours. Thyroid Function Tests: No results for input(s): TSH, T4TOTAL, FREET4, T3FREE, THYROIDAB in the last 168 hours. Coagulation:  Recent Labs Lab 09/15/15 1526 09/17/15 0528 09/18/15 0442  LABPROT 24.5* 25.3* 28.0*  INR 2.23* 2.33* 2.66*   Anemia Panel: No results for input(s): VITAMINB12, FOLATE, FERRITIN, TIBC, IRON, RETICCTPCT in the last 168 hours. Urine Drug Screen: Drugs of Abuse     Component Value Date/Time   LABOPIA NONE DETECTED 08/11/2011 2331   COCAINSCRNUR NONE DETECTED 08/11/2011 2331   LABBENZ NONE DETECTED 08/11/2011 2331   AMPHETMU NONE DETECTED 08/11/2011 2331   THCU NONE DETECTED 08/11/2011 2331   LABBARB NONE  DETECTED 08/11/2011 2331    Alcohol Level: No results for input(s): ETH in the last 168 hours. Urinalysis:  Recent Labs Lab 09/15/15 1709  COLORURINE YELLOW  LABSPEC 1.018  PHURINE 6.5  GLUCOSEU NEGATIVE  HGBUR TRACE*  BILIRUBINUR NEGATIVE  KETONESUR NEGATIVE  PROTEINUR 30*  NITRITE NEGATIVE  LEUKOCYTESUR NEGATIVE     Micro Results: Recent Results (from the past 240 hour(s))  MRSA PCR Screening     Status: None   Collection Time: 09/15/15  9:55 AM  Result Value Ref Range Status   MRSA by PCR NEGATIVE NEGATIVE Final    Comment:        The GeneXpert MRSA Assay (FDA approved for NASAL specimens only), is one component of a comprehensive MRSA colonization surveillance program. It is not intended to diagnose MRSA infection nor to guide or monitor treatment for MRSA infections.    Studies/Results: No  results found. Medications:  Scheduled Meds: . amLODipine  2.5 mg Oral Daily  . aspirin EC  81 mg Oral Daily  . atorvastatin  40 mg Oral q1800  . furosemide  20 mg Oral Daily  . [START ON 09/22/2015] warfarin  7.5 mg Oral Once per day on Mon Thu   And  . [START ON 09/19/2015] warfarin  5 mg Oral Once per day on Sun Tue Wed Fri Sat  . warfarin  5 mg Oral ONCE-1800  . Warfarin - Pharmacist Dosing Inpatient   Does not apply q1800   Continuous Infusions:  PRN Meds:.acetaminophen, ondansetron (ZOFRAN) IV Assessment/Plan: Principal Problem:   Ileus ([place]) Active Problems:   HLD (hyperlipidemia)   Essential hypertension, benign   Pulmonary embolism (HCC)   Constipation   CAD (coronary artery disease)   Normocytic anemia   Chronic systolic heart failure (HCC)   Diarrhea  Ileus vs SBO: no BM since admission, no nausea or vomiting. He tolerated regular diet. He had a formed BM yesterday.   -Plan would be to discharge home today most likely -repeat BMET and Mag today. - Zofran prn nausea - monitor electrolytes, replete as needed - Tylenol prn pain  History of Pulmonary Emboli: Hx unprovoked PE in 2010 and completed 6 months of coumadin therapy. Then after coumadin discontinued, he had another PE in Oct 2011 and has been on coumadin since that time. His INR was therapeutic on admission at 2.23. And is therapeutic today.  - Continue coumadin   HTN: BP elevated in [id number] systolic on admission. Could be elevated secondary to pain. He is on Amlodipine 2.5 mg daily and Lasix 20 mg daily at home.  Blood pressure has been stable over past 24 hours   - Continue home meds   Diet: regular diet  VTE PPx: Coumadin   Dispo: Disposition is deferred at this time, awaiting improvement of current medical problems.  Anticipated discharge in approximately 1-2 day(s).   The patient does have a current PCP Francesca Oman, DO) and does need an [facility] hospital follow-up appointment after  discharge.  The patient does have transportation limitations that hinder transportation to clinic appointments.  .Services Needed at time of discharge: Y = Yes, Blank = No PT:   OT:   RN:   Equipment:   Other:     LOS: 3 days   Burgess Estelle, MD 09/18/2015, 10:05 AM

## 2015-09-18 NOTE — Progress Notes (Signed)
Patient seen and examined. Case d/w residents in detail. I agree with findings and plan as documented in Dr. Sherlynn Carbon note.  Patient was admitted for abd pain secondary to ileus v/s partial SBO. He is now tolerating PO and abd pain has resolved. He is having BMs and appears well. Abdomen is soft, non distended and BS +. It appears that his ileus has resolved. Will check repeat BMP and supplement electrolytes as needed. He is stable for d/c home today.

## 2015-09-18 NOTE — Progress Notes (Signed)
Rafael Bihari to be D/C'd to home per MD order.  Discussed with the patient and all questions fully answered.  VSS, Skin clean, dry and intact without evidence of skin break down, no evidence of skin tears noted. IV catheter discontinued intact. Site without signs and symptoms of complications. Dressing and pressure applied.  An After Visit Summary was printed and given to the patient's care giver.  D/c education completed with patient/family including follow up instructions, medication list, d/c activities limitations if indicated, with other d/c instructions as indicated by MD - patient able to verbalize understanding, all questions fully answered.   Patient instructed to return to ED, call 911, or call MD for any changes in condition.   Patient escorted via [place], and D/C home via private auto.  Thales Knipple L Price 09/18/2015 1:21 PM

## 2015-09-18 NOTE — Care Management Important Message (Signed)
Important Message  Patient Details  Name: [name] [name] MRN: [id number] Date of Birth: 1931/05/23   Medicare Important Message Given:  Yes    Nathen May 09/18/2015, 12:36 PM

## 2015-09-18 NOTE — Discharge Instructions (Addendum)
You were admitted for partial small bowel obstruction.   We have made a hospital follow up appointment for you on January 17th at 8:45 AM with your PCP- please make sure to go to that appointment   If you  Have any further episodes of belly pain or vomiting , please cal the clinic, or if the clinic is closed, then please go to the ER.   Please have small, frequent meals to minimize stomach upset.

## 2015-09-18 NOTE — Discharge Summary (Signed)
Name: [name] [name] MRN: [id number] DOB: 05-13-1931 81 y.o. PCP: Francesca Oman, DO  Date of Admission: 09/15/2015 12:30 PM Date of Discharge: 09/18/2015 Attending Physician: Aldine Contes, MD  Discharge Diagnosis: 1. Ileus  Principal Problem:   Ileus ([place]) Active Problems:   HLD (hyperlipidemia)   Essential hypertension, benign   Pulmonary embolism (HCC)   Constipation   CAD (coronary artery disease)   Normocytic anemia   Chronic systolic heart failure ([place])   Diarrhea  Discharge Medications:   Medication List    STOP taking these medications        b complex vitamins tablet     vitamin C 500 MG tablet  Commonly known as:  ASCORBIC ACID      TAKE these medications        acetaminophen 500 MG tablet  Commonly known as:  TYLENOL  Take 500 mg by mouth every 4 (four) hours as needed for mild pain or headache.     amLODipine 2.5 MG tablet  Commonly known as:  NORVASC  TAKE 1 TABLET (2.5 MG TOTAL) BY MOUTH DAILY.     aspirin 81 MG EC tablet  Take 1 tablet (81 mg total) by mouth daily.     atorvastatin 40 MG tablet  Commonly known as:  LIPITOR  TAKE 1 TABLET (40 MG TOTAL) BY MOUTH EVERY EVENING.     calcium-vitamin D 500-200 MG-UNIT tablet  Take 1 tablet by mouth daily.     carbidopa-levodopa 25-100 MG tablet  Commonly known as:  SINEMET IR  Take 1 tablet by mouth 2 (two) times daily.     fish oil-omega-3 fatty acids 1000 MG capsule  Take 2 g by mouth daily.     furosemide 20 MG tablet  Commonly known as:  LASIX  Take 1 tablet (20 mg total) by mouth daily.     memantine 10 MG tablet  Commonly known as:  NAMENDA  Take 1 tablet (10 mg total) by mouth 2 (two) times daily.     multivitamin with minerals tablet  Take 1 tablet by mouth daily.     omeprazole 20 MG capsule  Commonly known as:  PRILOSEC  TAKE 1 CAPSULE (20 MG TOTAL) BY MOUTH DAILY.     potassium chloride 10 MEQ tablet  Commonly known as:  K-DUR  Take 1 tablet (10 mEq total) by  mouth as directed. 1st dose take 2 tabs = 20 meq then decrease to 1 tab = 10 meq daily     traZODone 50 MG tablet  Commonly known as:  DESYREL  Take 1 tablet (50 mg total) by mouth at bedtime.     warfarin 5 MG tablet  Commonly known as:  COUMADIN  Take as directed.        Disposition and follow-up:   Mr.Orvin Piloto was discharged from [facility] in Good condition.  At the hospital follow up visit please address:  1.  Has the abd pain recurred? He is asked to go to ER if it recurs or if he develops any symptoms out of the ordinary. Is he having normal bowel movements?  History of PE- needs monitoring of INR  HTN: repeat BP- it was slightly elevated   2.  Labs / imaging needed at time of follow-up: BMET, CBC  3.  Pending labs/ test needing follow-up:   Follow-up Appointments:     Follow-up Information    Follow up with [facility].   Specialty:  Home Health  Services   Why:  For [facility] RN HHA PT OT. Will call in 1-2 days after discharge to set up first home visit.   Contact information:   [place] [address] [phone]       Follow up with [facility].   Why:  Will deliver 3 in 1 to room prior to discharge   Contact information:   [address] [place] [address] [phone]       Go to [name], DO.   Specialty:  Internal Medicine   Why:  January 17th at 8:45 AM   Contact information:   [place] [address] [phone]       Discharge Instructions: Discharge Instructions    Diet - low sodium heart healthy    Complete by:  As directed      Increase activity slowly    Complete by:  As directed            Consultations:    Procedures Performed:  Dg Chest 2 View  09/15/2015  CLINICAL DATA:  Weakness EXAM: CHEST  2 VIEW COMPARISON:  12/13/2014 FINDINGS: Cardiomediastinal silhouette is stable. There is small left pleural prior left basilar  atelectasis or infiltrate. No pulmonary edema. IMPRESSION: Small left pleural effusion left basilar atelectasis or infiltrate. No pulmonary edema. Electronically Signed   By: Lahoma Crocker M.D.   On: 09/15/2015 15:57   Ct Abdomen Pelvis W Contrast  09/15/2015  CLINICAL DATA:  Generalized abdominal pain. EXAM: CT ABDOMEN AND PELVIS WITH CONTRAST TECHNIQUE: Multidetector CT imaging of the abdomen and pelvis was performed using the standard protocol following bolus administration of intravenous contrast. CONTRAST:  150mL OMNIPAQUE IOHEXOL 300 MG/ML SOLN, 59mL OMNIPAQUE IOHEXOL 300 MG/ML SOLN COMPARISON:  CT abdomen and pelvis 06/01/2013. FINDINGS: Lower chest: Linear airspace opacities are present at the left base. There is mild atelectasis at the right base. Coronary artery calcifications are again seen. The heart size is normal. There are no significant pleural or pericardial effusions. Hepatobiliary: The liver, common bile duct, and gallbladder are within normal limits. Pancreas: Negative Spleen: Within normal limits Adrenals/Urinary Tract: A 3.5 cm benign appearing cyst at the lower pole of the right kidney is slightly larger than on the prior study. No other focal lesions are present. The ureters are within normal limits. The urinary bladder is unremarkable. Stomach/Bowel: The stomach is moderately distended. Fluid levels in mild distention are present in the proximal small bowel. There is a relative transition point in the mid lower abdomen. Extensive stool is present throughout the colon to the level of the rectosigmoid colon which demonstrates gaseous distension to the rectum. There is some fluid at the rectum. No obstructing lesion is present. Surgical clips are present about about the prostate. Vascular/Lymphatic: Extensive atherosclerotic calcifications are present without aneurysm. There is no significant adenopathy. Reproductive: Prostatectomy. Other: No significant free fluid is present. Musculoskeletal:  Rightward curvature of the lumbar spine is centered at L2-3. There is right lateral listhesis at L3-4 and L4-5. Multilevel foraminal disease is due to facet hypertrophy and endplate osteophyte formation. IMPRESSION: 1. Moderate distention of the stomach and proximal small bowel fluid levels. This is compatible with a ileus or a partial distal small bowel obstruction. There may be a transition point in low abdomen. No associated mass lesion is present. 2. Moderate distention of the colon with extensive stool to the level of the sigmoid colon. 3. Extensive atherosclerotic disease including coronary  artery disease. 4. Bibasilar atelectasis, left greater than right. 5. Scoliosis and multilevel foraminal narrowing. Electronically Signed   By: San Morelle M.D.   On: 09/15/2015 16:59    2D Echo:   Cardiac Cath:   Admission HPI:   Mr. Gelinas is a 81yo man with PMHx of HTN, PE on coumadin, CAD, hyperlipidemia, and prostate cancer s/p surgical tx who presents to the ED with a 1 week hx of abdominal pain and diarrhea. His daughter is present at the bedside and provides the majority of the history. She describes he started having watery diarrhea about 1 week ago and then noticed over the last 1-2 days that he is having "clear jelly" for his bowel movements. She also states he had epigastric abdominal pain for the last few days. Patient states he currently does not have any pain. He denies fevers, chills, nausea, vomiting, or bloody stools. He has never had a colonoscopy due to "his fall risk and coumadin use" per the daughter. She reports he has been dealing with constipation for over a year. He was taking milk of magnesia for several months which seemed to help but then his family stopped buying it for him. He had negative hemoccult stool cards in August this year.   In the ED a CT Abd/Pelvis showed moderate distention of the stomach and proximal small bowel fluid levels that is compatible with an ileus  or partial SBO. No associated mass lesion present. The ED physician spoke with general surgery and they believe his condition to be more ileus-related than SBO.    Hospital Course by problem list: Principal Problem:   Ileus ([place]) Active Problems:   HLD (hyperlipidemia)   Essential hypertension, benign   Pulmonary embolism (HCC)   Constipation   CAD (coronary artery disease)   Normocytic anemia   Chronic systolic heart failure (HCC)   Diarrhea   1.  Likely Ileus, unlikely to be SBO: Patient presenting with a 1 week history of diarrhea/watery stools and epigastric abdominal pain. CT Abd/Pelvis with likely ileus vs partial SBO. Given he is afebrile, has a benign abdominal exam, and no symptoms of nausea or vomiting and abdominal pain is resolving it is likely to be an ileus than SBO. Per the daughter, he seems to have chronic constipation that was relieved when he was taking milk of magnesia regularly. He is not on chronic opioids, but is taking carbidopa-levodopa which could be responsible for his constipation. He was hydrated with IV fluids, electrolytes were monitored and repleted as needed. An NGT was not needed. His diet was successfully advanced daily until he was able to tolerate regular diet. He was asked to take small frequent meals at home to minimize stomach upset. He had normal formed bowel movements and his diarrhea had resolved.   Hx Pulmonary Emboli: Hx unprovoked PE in 2010 and completed 6 months of coumadin therapy. Then after coumadin discontinued, he had another PE in Oct 2011 and has been on coumadin since that time. His INR was therapeutic on admission at 2.23 and remained therapeutic throughout. Coumadin was continued on discharge.   Normocytic Anemia: Hgb 12.4 on admission. His baseline Hgb is in the low 12 range. He has never had a colonoscopy but stool cards negative this year. His hemoglobin remained stable.  HTN: BP elevated in [id number] systolic on admission. He is on  Amlodipine 2.5 mg daily and Lasix 20 mg daily at home which were continued on discharge.  CAD: No chest pain. Hx of MI  in 2008. Last cardiac cath in March 2016 with single vessel disease in the LAD. Aspirin and statin were continued.  Hyperlipidemia:  home Atorvastatin 40 mg daily was continued.   Discharge Vitals:   BP 117/76 mmHg  Pulse 63  Temp(Src) 98 F (36.7 C) (Oral)  Resp 18  Ht 6' (1.829 m)  Wt 151 lb 1.6 oz (68.539 kg)  BMI 20.49 kg/m2  SpO2 100%  Discharge Labs:  Results for orders placed or performed during the hospital encounter of 09/15/15 (from the past 24 hour(s))  Protime-INR     Status: Abnormal   Collection Time: 09/18/15  4:42 AM  Result Value Ref Range   Prothrombin Time 28.0 (H) 11.6 - 15.2 seconds   INR 2.66 (H) 0.00 - [id number]  Basic metabolic panel     Status: Abnormal   Collection Time: 09/18/15 10:13 AM  Result Value Ref Range   Sodium 141 135 - 145 mmol/L   Potassium 3.5 3.5 - 5.1 mmol/L   Chloride 107 101 - 111 mmol/L   CO2 23 22 - 32 mmol/L   Glucose, Bld 102 (H) 65 - 99 mg/dL   BUN 14 6 - 20 mg/dL   Creatinine, Ser 1.13 0.61 - 1.24 mg/dL   Calcium 9.1 8.9 - 10.3 mg/dL   GFR calc non Af Amer 59 (L) >60 mL/min   GFR calc Af Amer >60 >60 mL/min   Anion gap 11 5 - 15  Magnesium     Status: None   Collection Time: 09/18/15 10:13 AM  Result Value Ref Range   Magnesium 2.0 1.7 - 2.4 mg/dL  Phosphorus     Status: None   Collection Time: 09/18/15 10:13 AM  Result Value Ref Range   Phosphorus 3.8 2.5 - 4.6 mg/dL    Signed: Burgess Estelle, MD 09/18/2015, 11:36 AM    Services Ordered on Discharge:  Equipment Ordered on Discharge:

## 2015-09-22 DIAGNOSIS — R531 Weakness: Secondary | ICD-10-CM | POA: Diagnosis not present

## 2015-09-22 DIAGNOSIS — G3183 Dementia with Lewy bodies: Secondary | ICD-10-CM | POA: Diagnosis not present

## 2015-09-22 DIAGNOSIS — F0280 Dementia in other diseases classified elsewhere without behavioral disturbance: Secondary | ICD-10-CM | POA: Diagnosis not present

## 2015-09-22 DIAGNOSIS — S6991XD Unspecified injury of right wrist, hand and finger(s), subsequent encounter: Secondary | ICD-10-CM | POA: Diagnosis not present

## 2015-09-22 DIAGNOSIS — I110 Hypertensive heart disease with heart failure: Secondary | ICD-10-CM | POA: Diagnosis not present

## 2015-09-23 ENCOUNTER — Ambulatory Visit (INDEPENDENT_AMBULATORY_CARE_PROVIDER_SITE_OTHER): Payer: Commercial Managed Care - HMO | Admitting: Neurology

## 2015-09-23 ENCOUNTER — Encounter: Payer: Self-pay | Admitting: Neurology

## 2015-09-23 VITALS — BP 117/76 | HR 66 | Ht 67.0 in | Wt 156.1 lb

## 2015-09-23 DIAGNOSIS — R451 Restlessness and agitation: Secondary | ICD-10-CM

## 2015-09-23 MED ORDER — QUETIAPINE FUMARATE 25 MG PO TABS: 12.5000 mg | ORAL_TABLET | Freq: Every day | ORAL | Status: DC

## 2015-09-23 NOTE — Progress Notes (Signed)
[name] DOB: 1931-05-05  REASON FOR VISIT: routine follow up for Alzheimer's and Parkinson's HISTORY FROM: patient  HISTORY OF PRESENT ILLNESS: UPDATE 01/23/14 (LL):  MMSE today is 23/30, AFT 9 and Clock drawing 1/4. He is tolerating Sinemet tid and Namenda well.  Mood is better on Zoloft. Having difficulty sleeping.  Does not nap during the day.  Has difficulty getting to sleep and wakes frequently.  Has tried Melatonin in the past without benefit.  He used Nortriptyline in the past for insomnia and pain, but at 100 mg dose became toxic (unresponsive).  Daughter notices "sundowning" at night, more confused in nighttime hours.   UPDATE 06/23/13 (LL): [name] [name] comes back for 2 month revisit. Last visit he was started on Sinemet to see if he would have any benefit. Both he and his daughter think that he is moving better since on it. Daughter states that he is no longer "freezing" when he tries to take a step. He is currently taking it twice a day, once when he gets up (around 7-8) and then again at 5 pm. He has no new complaints. MMSE today is 27/30, AFT 8 and Clock drawing 1/4.   UPDATE 05/24/13 (LL): Patient returns for followup. After starting Aricept at last visit daughter reports that he became very weak, confused, and had to be hospitalized. Was found to be bradycardic and symptomatic with heart rate in the 40s, attributable to the Aricept. Medication was stopped and patient was discharged to rehabilitation for short time and has since returned home. Daughter states that he has adjusted somewhat to having moved in with her, is taking Zoloft for depression, and has disinterest in getting out of the house much. States that sometimes it is hard for him to pick up his foot to take a step and it seems to be when he is nervous. Also seems to have tremor when nervous. Has had trouble with insomnia was started on melatonin 5 mg at bedtime with no response. Daughter feels like he has  improved some in his cognitive ability since last visit. MMSE last visit was 23/30. MMSE today is 25/30 with deficits in recall. Clock drawing 0/4. AFT 10. Geriatric depression score is 3 does not suggest depression.   PRIOR HPI 02/05/13 (PS): 80 male with remote right hemispheric TIA in May 2011 and mild cognitive impairment. He returns today for followup after his last visit with me on 01/05/2012. Continues to do well from a neurovascular standpoint and has not had a any further stroke or TIAs. He remains on warfarin and is tolerating it well without significant bleeding, bruising or the side effects. He states his blood pressure is under good control and it is 124/76 in office today. He however is had increasing confusion and memory difficulties for the last 1 year which have been progressive. He now needs help with taking his medications. He has stopped going out. He in fact wandered out a few weeks ago from his home and needed help to get back. He denies any agitation or caval changes. He has no significant hallucination or other lesions. His gait and balance are fine and there have been no safety issues. He has now moved and to live with his daughter for the last 2-3 months. The patient did undergo labs for treatable causes of cognitive impairment at last visit and vitamin B12, TSH and RPR were normal on 01/05/2012. His mother did have Alzheimer's.  Update 10/23/2014 : He returns for follow-up after  last visit 9 months ago. He is accompanied by his daughter who provides most of the history. Patient feels he is about the same and the daughter agrees. He continues to have poor short-term memory and needs help with activities like his medications and food. He can dress himself and uses a toilet on most occasions with only occasional help. He sometimes has trouble walking and states that his legs don't move but once he gets started he can walk fairly well. He walks with a stooped posture but his balance is not bad  and he is not had any recent falls. He remains on Namenda XR 28 mg daily which he is tolerating well. He had been on Aricept for years but developed slow heart rate requiring it to be stopped. He has had no interval health problems. He hasn't not been having any significant hallucinations, delusions, agitation or safety concerns. He does get anxious occasionally. The patient's other daughter wants him and his wife to come and stay with her for 3 months and the family is contemplating whether that would be a good move or not Update 04/23/2015 : He returns for follow-up after last visit 6 months ago. He is accompanied by his daughter. The patient was hospitalized in March with syncopal episodes with hypertension and low heart rate. His blood pressure medications were adjusted and tapered and lisinopril and Lasix were discontinued. He undergo a heart monitor for a few days. He is doing better now but blood pressure running in the 102H systolic range. However his been feeling weak all over as has not been walking a lot. He has not been using his walker. He feels his leg is slow and stiff and stick to the ground. He is walking difficulty to the more pronounced when is anxious. He has noticed previously some benefit after adding Sinemet which he takes 25/100 one tablet 3 times daily. Daughter has not noticed any significant further cognitive decline however on Mini-Mental status exam testing today she scored 17 out of 30 which is a significant drop from  22/30 at last visit. He remains on Namenda etc. and in the past has not been able to tolerate Aricept due to his heart rate being slow . Update 09/23/2015 : He returns for follow-up with his last visit with me 5 months ago. He is accompanied by his daughter who provides most of the history. Patient continues to have cognitive decline as well as problems with gait and balance. He remains on Sinemet which he takes in the morning when he gets up as well as close to bedtime.  His daughter feels that towards the evening time his legs dragging and he is unable to move them as well and perhaps he should take the evening dose earlier. He is also had increasing agitation at night and not being able to sleep and he does Nap during the day a lot. He was unable to afford Namenda etc. due to being in the doughnut hole and when he was switched to generic Namenda 10 mg twice daily he seems not to have done as well. On Mini-Mental status score testing today score 9/30 which is a significant decline from 17/30 at last visit. He was seen by Dr. 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 The patient was tried on trazodone but it made him too sleepy during the day and he took only 1 dose. Patient  has recently started home physical therapy. The patient's daughter seems extremely committed to keeping him at home and is looking at options to hire a caregiver REVIEW OF SYSTEMS: Full 14 system review of systems performed and notable only for:  Constipation, insomnia, daytime sleepiness, walking difficulty, nervousness, anxiety and all other systems negative   ALLERGIES: Allergies  Allergen Reactions  . Aricept [Donepezil Hcl] Other (See Comments)    Symptomatic Bradycardia.  . Other Other (See Comments)    Shrimp gives him gout    HOME MEDICATIONS: Outpatient Prescriptions Prior to Visit  Medication Sig Dispense Refill  . acetaminophen (TYLENOL) 500 MG tablet Take 500 mg by mouth every 4 (four) hours as needed for mild pain or headache.    [name] amLODipine (NORVASC) 2.5 MG tablet TAKE 1 TABLET (2.5 MG TOTAL) BY MOUTH DAILY. 90 tablet 3  . aspirin EC 81 MG EC tablet Take 1 tablet (81 mg total) by mouth daily.    [name] atorvastatin (LIPITOR) 40 MG tablet TAKE 1 TABLET (40 MG TOTAL) BY MOUTH EVERY EVENING. 90 tablet 1  . Calcium Carbonate-Vitamin D (CALCIUM-VITAMIN D)  500-200 MG-UNIT per tablet Take 1 tablet by mouth daily.    . carbidopa-levodopa (SINEMET IR) 25-100 MG per tablet Take 1 tablet by mouth 2 (two) times daily. 60 tablet 3  . fish oil-omega-3 fatty acids 1000 MG capsule Take 2 g by mouth daily.     . furosemide (LASIX) 20 MG tablet Take 1 tablet (20 mg total) by mouth daily.    . memantine (NAMENDA) 10 MG tablet Take 1 tablet (10 mg total) by mouth 2 (two) times daily. 60 tablet 3  . Multiple Vitamins-Minerals (MULTIVITAMIN WITH MINERALS) tablet Take 1 tablet by mouth daily.    [name] omeprazole (PRILOSEC) 20 MG capsule TAKE 1 CAPSULE (20 MG TOTAL) BY MOUTH DAILY. 90 capsule 0  . potassium chloride (K-DUR) 10 MEQ tablet Take 1 tablet (10 mEq total) by mouth as directed. 1st dose take 2 tabs = 20 meq then decrease to 1 tab = 10 meq daily (Patient taking differently: Take 10 mEq by mouth daily. ) 90 tablet 3  . traZODone (DESYREL) 50 MG tablet Take 1 tablet (50 mg total) by mouth at bedtime. 30 tablet 2  . warfarin (COUMADIN) 5 MG tablet Take as directed. (Patient taking differently: Take 5-7.5 mg by mouth daily. 7.5mg  on Monday and Thursday and 5 mg all other days) 40 tablet 2   No facility-administered medications prior to visit.   PHYSICAL EXAM  Filed Vitals:   09/23/15 1401  BP: 117/76  Pulse: 66  Height: 5\' 7"  (1.702 m)  Weight: 156 lb 1.6 oz (70.806 kg)   Body mass index is 24.44 kg/(m^2).  Generalized: In no acute distress, pleasant  Frail elderly AA male, well groomed, well developed  Neck: Supple, no carotid bruits  Cardiac: Irregular rate and rhythm, soft 1/6 murmur  Pulmonary: Clear to auscultation bilaterally  Musculoskeletal: mild kyphosis   Neurological examination  MMSE history: 09/23/15 MMSE 9/30 04/23/2015 : MMSE 17/30 10/23/14 MMSE 22/30, AFT 11, Clock 1/4, GDS 5 01/23/14:   MMSE 23/30, recall 1/3, AFT 9 and Clock Drawing 1/4. 06/23/13:  MMSE 27/30, recall 3/3, AFT 8 and Clock drawing 1/4. GDS 5. 02/05/13:  MMSE 25/30, recall  1/3, AFT 10, Clock drawing 0/4. GDS 3.  Mentation: Alert, language fluent. Mini-Mental status exam scored 9/30. Deficits in orientation, attention, recall and following three-step commands. Animal naming unable. Clock drawing 1/4.[name] Mood and affect  appropriate. MASKED FACIES.   Cranial nerve II-XII: Pupils were equal round reactive to light extraocular movements were full, visual field were full on confrontational test. facial sensation and strength were normal. hearing was intact to finger rubbing bilaterally. Uvula tongue midline.   MOTOR: normal bulk and tone, full strength in the BUE, BLE, fine finger movements normal, no pronator drift. MILD COGWHEEL RIGIDIITY IN ARMS AND WRISTS, L>R.  SENSORY: normal and symmetric to light touch  COORDINATION: Normal finger-nose-finger, heel-to-shin bilaterally.  REFLEXES: 1+ and symmetric. POSITIVE MYERSON'S, POSITIVE SNOUT, NEGATIVE PALMOMENTAL.  GAIT/STATION: Rising up from seated position without assistance but significant initiation apraxia., STOOPED STANCE, without truncal ataxia, moderate stride, POOR ARM SWING ON RIGHT, TURNS IN 3 STEPS,  uses a wheeled walker. ASSESSMENT AND PLAN 82 year old AA male patient with mild cognitive impairment which appears to progressed to mild dementia. Likely Alzheimer`s type. Remote history of right brain TIA in May 2011 and stable from a neurovascular standpoint. Aricept caused bradycardia. 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 PLAN:   I had a long discussion with the patient and her his daughter regarding his fall risk, worsening dementia, agitation at night and answered questions. I recommend he switch back to Namenda XR 28 mg daily which seem to have worked better for him. Trial of Seroquel 12.5 mg at night to help with sundowning and agitation. Change the timing of Sinemet dose to breakfast and early afternoon  to help his gait apraxia and mobility. He was again advised fall prevention precautions. Continue warfarin for stroke prevention. Return for follow-up in 6 months or call earlier if necessary.   [name], MD  09/23/2015, 6:11 PM [facility] [address], [place], [place] [address] [phone]  Note: This document was prepared with digital dictation and possible smart phrase technology. Any transcriptional errors that result from this process are unintentional.

## 2015-09-23 NOTE — Patient Instructions (Addendum)
I had a long discussion with the patient and her his daughter regarding his fall risk, worsening dementia, agitation at night and answered questions. I recommend he switch back to Namenda XR 28 mg daily which seem to have worked better for him. Trial of Seroquel 12.5 mg at night to help with sundowning and agitation. Change the timing of Sinemet dose to breakfast and early afternoon to help his gait apraxia and mobility. He was again advised fall prevention precautions. Continue warfarin for stroke prevention. Return for follow-up in 6 months or call earlier if necessary.  Fall Prevention in the Home  Falls can cause injuries. They can happen to people of all ages. There are many things you can do to make your home safe and to help prevent falls.  WHAT CAN I DO ON THE OUTSIDE OF MY HOME?  Regularly fix the edges of walkways and driveways and fix any cracks.  Remove anything that might make you trip as you walk through a door, such as a raised step or threshold.  Trim any bushes or trees on the path to your home.  Use bright outdoor lighting.  Clear any walking paths of anything that might make someone trip, such as rocks or tools.  Regularly check to see if handrails are loose or broken. Make sure that both sides of any steps have handrails.  Any raised decks and porches should have guardrails on the edges.  Have any leaves, snow, or ice cleared regularly.  Use sand or salt on walking paths during winter.  Clean up any spills in your garage right away. This includes oil or grease spills. WHAT CAN I DO IN THE BATHROOM?   Use night lights.  Install grab bars by the toilet and in the tub and shower. Do not use towel bars as grab bars.  Use non-skid mats or decals in the tub or shower.  If you need to sit down in the shower, use a plastic, non-slip stool.  Keep the floor dry. Clean up any water that spills on the floor as soon as it happens.  Remove soap buildup in the tub or shower  regularly.  Attach bath mats securely with double-sided non-slip rug tape.  Do not have throw rugs and other things on the floor that can make you trip. WHAT CAN I DO IN THE BEDROOM?  Use night lights.  Make sure that you have a light by your bed that is easy to reach.  Do not use any sheets or blankets that are too big for your bed. They should not hang down onto the floor.  Have a firm chair that has side arms. You can use this for support while you get dressed.  Do not have throw rugs and other things on the floor that can make you trip. WHAT CAN I DO IN THE KITCHEN?  Clean up any spills right away.  Avoid walking on wet floors.  Keep items that you use a lot in easy-to-reach places.  If you need to reach something above you, use a strong step stool that has a grab bar.  Keep electrical cords out of the way.  Do not use floor polish or wax that makes floors slippery. If you must use wax, use non-skid floor wax.  Do not have throw rugs and other things on the floor that can make you trip. WHAT CAN I DO WITH MY STAIRS?  Do not leave any items on the stairs.  Make sure that there are  handrails on both sides of the stairs and use them. Fix handrails that are broken or loose. Make sure that handrails are as long as the stairways.  Check any carpeting to make sure that it is firmly attached to the stairs. Fix any carpet that is loose or worn.  Avoid having throw rugs at the top or bottom of the stairs. If you do have throw rugs, attach them to the floor with carpet tape.  Make sure that you have a light switch at the top of the stairs and the bottom of the stairs. If you do not have them, ask someone to add them for you. WHAT ELSE CAN I DO TO HELP PREVENT FALLS?  Wear shoes that:  Do not have high heels.  Have rubber bottoms.  Are comfortable and fit you well.  Are closed at the toe. Do not wear sandals.  If you use a stepladder:  Make sure that it is fully  opened. Do not climb a closed stepladder.  Make sure that both sides of the stepladder are locked into place.  Ask someone to hold it for you, if possible.  Clearly mark and make sure that you can see:  Any grab bars or handrails.  First and last steps.  Where the edge of each step is.  Use tools that help you move around (mobility aids) if they are needed. These include:  Canes.  Walkers.  Scooters.  Crutches.  Turn on the lights when you go into a dark area. Replace any light bulbs as soon as they burn out.  Set up your furniture so you have a clear path. Avoid moving your furniture around.  If any of your floors are uneven, fix them.  If there are any pets around you, be aware of where they are.  Review your medicines with your doctor. Some medicines can make you feel dizzy. This can increase your chance of falling. Ask your doctor what other things that you can do to help prevent falls.   This information is not intended to replace advice given to you by your health care provider. Make sure you discuss any questions you have with your health care provider.   Document Released: 07/03/2009 Document Revised: 01/21/2015 Document Reviewed: 10/11/2014 Elsevier Interactive Patient Education [employer].

## 2015-09-24 ENCOUNTER — Ambulatory Visit: Payer: Commercial Managed Care - HMO | Admitting: Pulmonary Disease

## 2015-09-24 DIAGNOSIS — F0280 Dementia in other diseases classified elsewhere without behavioral disturbance: Secondary | ICD-10-CM | POA: Diagnosis not present

## 2015-09-24 DIAGNOSIS — G3183 Dementia with Lewy bodies: Secondary | ICD-10-CM | POA: Diagnosis not present

## 2015-09-24 DIAGNOSIS — S6991XD Unspecified injury of right wrist, hand and finger(s), subsequent encounter: Secondary | ICD-10-CM | POA: Diagnosis not present

## 2015-09-24 DIAGNOSIS — R531 Weakness: Secondary | ICD-10-CM | POA: Diagnosis not present

## 2015-09-24 DIAGNOSIS — I110 Hypertensive heart disease with heart failure: Secondary | ICD-10-CM | POA: Diagnosis not present

## 2015-09-26 ENCOUNTER — Telehealth: Payer: Self-pay

## 2015-09-26 DIAGNOSIS — F0280 Dementia in other diseases classified elsewhere without behavioral disturbance: Secondary | ICD-10-CM | POA: Diagnosis not present

## 2015-09-26 DIAGNOSIS — G3183 Dementia with Lewy bodies: Secondary | ICD-10-CM | POA: Diagnosis not present

## 2015-09-26 DIAGNOSIS — S6991XD Unspecified injury of right wrist, hand and finger(s), subsequent encounter: Secondary | ICD-10-CM | POA: Diagnosis not present

## 2015-09-26 DIAGNOSIS — I110 Hypertensive heart disease with heart failure: Secondary | ICD-10-CM | POA: Diagnosis not present

## 2015-09-26 DIAGNOSIS — R531 Weakness: Secondary | ICD-10-CM | POA: Diagnosis not present

## 2015-09-26 NOTE — Telephone Encounter (Signed)
Rec'd call from  [facility] (OT) Tommi Emery @ [phone] requesting verbal orders 1 time a week for 4 weeks then okay to discharge afterwards or @ maximum potential.

## 2015-09-26 NOTE — Telephone Encounter (Signed)
Is VO for [facility] RN OK with you?  RN wants to see pt once/week for 3-4 weeks for f/u on SBO and medication management

## 2015-09-27 NOTE — Telephone Encounter (Signed)
Yes, please give verbal OK for [facility] RN and OT.  Thank you.

## 2015-09-30 DIAGNOSIS — S62616D Displaced fracture of proximal phalanx of right little finger, subsequent encounter for fracture with routine healing: Secondary | ICD-10-CM | POA: Diagnosis not present

## 2015-10-01 ENCOUNTER — Telehealth: Payer: Self-pay

## 2015-10-01 DIAGNOSIS — R531 Weakness: Secondary | ICD-10-CM | POA: Diagnosis not present

## 2015-10-01 DIAGNOSIS — F0280 Dementia in other diseases classified elsewhere without behavioral disturbance: Secondary | ICD-10-CM | POA: Diagnosis not present

## 2015-10-01 DIAGNOSIS — S6991XD Unspecified injury of right wrist, hand and finger(s), subsequent encounter: Secondary | ICD-10-CM | POA: Diagnosis not present

## 2015-10-01 DIAGNOSIS — I110 Hypertensive heart disease with heart failure: Secondary | ICD-10-CM | POA: Diagnosis not present

## 2015-10-01 DIAGNOSIS — G3183 Dementia with Lewy bodies: Secondary | ICD-10-CM | POA: Diagnosis not present

## 2015-10-01 NOTE — Telephone Encounter (Signed)
Rn receive incoming call from [place] with [facility]. Colletta Maryland stated the pts daughter stated her father was recently prescribed Seroquel last week with Dr.Sethi last office visit. Colletta Maryland stated pt is alert,but just real lethargy, and sleepy, and has his eyes closed. Pts vital signs were stable. Colletta Maryland did not if patient should go to the ED. Rn explain that Dr.Sethi will not return to the office until January 25. Rn stated Dr.Sethi can give [name](daughter) a call about the Seroquel medication. Stephanie(EMT) stated she was still evaluating patient and thinks the medication dosage could be too much for patient. Pt has a a 6 month follow up in July 2017. Rn talk to [name] patients daughter and would like some recommendations on whether they should continue the Seroquel or discontinue it. Pt does have dementia, and parkinson. Pts daughter said at last office visit, Dr.Sehi did say it may take some adjusting medc for the patient. Rn stated a message will be sent to Dr.Sethi to call her about the medication issue.

## 2015-10-02 DIAGNOSIS — F0280 Dementia in other diseases classified elsewhere without behavioral disturbance: Secondary | ICD-10-CM | POA: Diagnosis not present

## 2015-10-02 DIAGNOSIS — I110 Hypertensive heart disease with heart failure: Secondary | ICD-10-CM | POA: Diagnosis not present

## 2015-10-02 DIAGNOSIS — R531 Weakness: Secondary | ICD-10-CM | POA: Diagnosis not present

## 2015-10-02 DIAGNOSIS — G3183 Dementia with Lewy bodies: Secondary | ICD-10-CM | POA: Diagnosis not present

## 2015-10-02 DIAGNOSIS — S6991XD Unspecified injury of right wrist, hand and finger(s), subsequent encounter: Secondary | ICD-10-CM | POA: Diagnosis not present

## 2015-10-02 NOTE — Telephone Encounter (Signed)
I spoke to patient's daughter [name] who informed me that the patient was taking Seroquel but she had trouble cutting the tablet in half. It did not seem to work in the night initially but worked later on and he was quite sleepy in the morning and difficult to arouse. I recommend that she try giving Seroquel earlier on in the evening and get a pill cutter to try to cut the tablet. She voiced understanding and will do so.

## 2015-10-02 NOTE — Telephone Encounter (Signed)
[place] (OT) Timmothy Sours calling about the orders.

## 2015-10-02 NOTE — Telephone Encounter (Signed)
VO given to [facility] for OT

## 2015-10-02 NOTE — Telephone Encounter (Signed)
VO given to [facility] for [employer]

## 2015-10-02 NOTE — Telephone Encounter (Signed)
I called [name]`s listed number but unable to leave message at his mailbox was full

## 2015-10-03 ENCOUNTER — Other Ambulatory Visit: Payer: Self-pay | Admitting: Neurology

## 2015-10-07 ENCOUNTER — Ambulatory Visit: Payer: Commercial Managed Care - HMO | Admitting: Internal Medicine

## 2015-10-08 DIAGNOSIS — R531 Weakness: Secondary | ICD-10-CM | POA: Diagnosis not present

## 2015-10-08 DIAGNOSIS — S6991XD Unspecified injury of right wrist, hand and finger(s), subsequent encounter: Secondary | ICD-10-CM | POA: Diagnosis not present

## 2015-10-08 DIAGNOSIS — I110 Hypertensive heart disease with heart failure: Secondary | ICD-10-CM | POA: Diagnosis not present

## 2015-10-08 DIAGNOSIS — F0280 Dementia in other diseases classified elsewhere without behavioral disturbance: Secondary | ICD-10-CM | POA: Diagnosis not present

## 2015-10-08 DIAGNOSIS — G3183 Dementia with Lewy bodies: Secondary | ICD-10-CM | POA: Diagnosis not present

## 2015-10-09 ENCOUNTER — Encounter: Payer: Self-pay | Admitting: Internal Medicine

## 2015-10-09 ENCOUNTER — Ambulatory Visit (INDEPENDENT_AMBULATORY_CARE_PROVIDER_SITE_OTHER): Payer: Commercial Managed Care - HMO | Admitting: Internal Medicine

## 2015-10-09 VITALS — BP 140/92 | HR 68 | Temp 97.9°F | Wt 158.1 lb

## 2015-10-09 DIAGNOSIS — R197 Diarrhea, unspecified: Secondary | ICD-10-CM

## 2015-10-09 NOTE — Progress Notes (Signed)
Subjective:    Patient ID: [name] [name], male    DOB: 1931/01/16, 82 y.o.   MRN: [id number]  HPI Comments: Mr. Hurd is an 82 year old man with PMH as below here for hospital follow-up.  He was recently admitted (12/26/ - 09/18/15) for ileus.  His abdominal pain has resolved but he is having explosive diarrhea 1-2x per day about twice per week.  Watery, non-bloody, sometimes completely clear.  Appetite is unchanged.  No normal BMs in between.  No one else is having diarrhea at home.   Recent admission but no recent antibiotic use.  He has liked to use MOM in the past but has not had access to laxatives and his daughter and nurses manage his medications.  Daughter says he was having similar diarrhea just before his admission.    Past Medical History  Diagnosis Date  . PE (pulmonary embolism)     unprovoked PE completed 6 months of warfarin, warfarin d/ced 02/12/2010, repeat PE 07/10/2010 after a long car ride and now on lifelong coumadin.  [name] Hypertension   . Pituitary microadenoma ([place])     incidental finding CT 12/2009  . Prostate cancer ([place])   . Depression   . Hyperlipidemia   . Coronary artery disease, non-occlusive     with history of MI; Cath 2008 w multivessel nonobstructive CAD  . Dementia   . GERD (gastroesophageal reflux disease)   . Scoliosis   . Anxiety    Current Outpatient Prescriptions on File Prior to Visit  Medication Sig Dispense Refill  . acetaminophen (TYLENOL) 500 MG tablet Take 500 mg by mouth every 4 (four) hours as needed for mild pain or headache.    [name] amLODipine (NORVASC) 2.5 MG tablet TAKE 1 TABLET (2.5 MG TOTAL) BY MOUTH DAILY. 90 tablet 3  . aspirin EC 81 MG EC tablet Take 1 tablet (81 mg total) by mouth daily.    [name] atorvastatin (LIPITOR) 40 MG tablet TAKE 1 TABLET (40 MG TOTAL) BY MOUTH EVERY EVENING. 90 tablet 1  . b complex vitamins tablet Take 1 tablet by mouth daily.    . Calcium Carbonate-Vitamin D (CALCIUM-VITAMIN D) 500-200 MG-UNIT per tablet  Take 1 tablet by mouth daily.    . carbidopa-levodopa (SINEMET IR) 25-100 MG per tablet Take 1 tablet by mouth 2 (two) times daily. 60 tablet 3  . carbidopa-levodopa (SINEMET IR) 25-100 MG tablet Take 1 tablet by mouth 2 (two) times daily. (breakfast and early afternoon) 180 tablet 1  . fish oil-omega-3 fatty acids 1000 MG capsule Take 2 g by mouth daily.     . furosemide (LASIX) 20 MG tablet Take 1 tablet (20 mg total) by mouth daily.    . memantine (NAMENDA) 10 MG tablet Take 1 tablet (10 mg total) by mouth 2 (two) times daily. 60 tablet 3  . Multiple Vitamins-Minerals (MULTIVITAMIN WITH MINERALS) tablet Take 1 tablet by mouth daily.    [name] omeprazole (PRILOSEC) 20 MG capsule TAKE 1 CAPSULE (20 MG TOTAL) BY MOUTH DAILY. 90 capsule 0  . potassium chloride (K-DUR) 10 MEQ tablet Take 1 tablet (10 mEq total) by mouth as directed. 1st dose take 2 tabs = 20 meq then decrease to 1 tab = 10 meq daily (Patient taking differently: Take 10 mEq by mouth daily. ) 90 tablet 3  . QUEtiapine (SEROQUEL) 25 MG tablet Take 0.5 tablets (12.5 mg total) by mouth at bedtime. 30 tablet 1  . traZODone (DESYREL) 50 MG tablet Take 1 tablet (50  mg total) by mouth at bedtime. 30 tablet 2  . vitamin C (ASCORBIC ACID) 500 MG tablet Take 500 mg by mouth daily.    [name] warfarin (COUMADIN) 5 MG tablet Take as directed. (Patient taking differently: Take 5-7.5 mg by mouth daily. 7.5mg  on Monday and Thursday and 5 mg all other days) 40 tablet 2   No current facility-administered medications on file prior to visit.    Review of Systems  Constitutional: Negative for fever, chills and appetite change.       Improvement in early satiety   Gastrointestinal: Positive for nausea. Negative for vomiting, abdominal pain, blood in stool and abdominal distention.       One episode of nausea last week.  Genitourinary: Negative for dysuria and difficulty urinating.  Neurological: Positive for light-headedness. Negative for syncope.    Psychiatric/Behavioral: Negative for dysphoric mood.       Filed Vitals:   10/09/15 1454  BP: 140/92  Pulse: 68  Temp: 97.9 F (36.6 C)  TempSrc: Oral  Weight: 158 lb 1.6 oz (71.714 kg)  SpO2: 98%    Objective:   Physical Exam  Constitutional: He is oriented to person, place, and time. He appears well-developed. No distress.  HENT:  Head: Normocephalic and atraumatic.  Mouth/Throat: Oropharynx is clear and moist. No oropharyngeal exudate.  Eyes: Conjunctivae and EOM are normal. Pupils are equal, round, and reactive to light. Right eye exhibits no discharge. Left eye exhibits no discharge. No scleral icterus.  Neck: Neck supple.  Cardiovascular: Normal rate, regular rhythm and normal heart sounds.  Exam reveals no gallop and no friction rub.   No murmur heard. Pulmonary/Chest: Effort normal and breath sounds normal. No respiratory distress. He has no wheezes. He has no rales.  Abdominal: Soft. Bowel sounds are normal. He exhibits no distension and no mass. There is no tenderness. There is no rebound and no guarding.  Musculoskeletal: Normal range of motion. He exhibits no edema or tenderness.  Neurological: He is alert and oriented to person, place, and time. No cranial nerve deficit.  Skin: Skin is warm. He is not diaphoretic.  Psychiatric: He has a normal mood and affect. His behavior is normal. Judgment and thought content normal.  Vitals reviewed.         Assessment & Plan:  Please see problem based charting for A&P.

## 2015-10-09 NOTE — Patient Instructions (Signed)
1. Please bring stool samples back as soon as possible for further testing.  I will call you once we have results.   2. Please take all medications as prescribed.    3. If you have worsening of your symptoms or new symptoms arise, please call the clinic [phone] or go to the ER immediately if symptoms are severe.

## 2015-10-10 DIAGNOSIS — R531 Weakness: Secondary | ICD-10-CM | POA: Diagnosis not present

## 2015-10-10 DIAGNOSIS — I110 Hypertensive heart disease with heart failure: Secondary | ICD-10-CM | POA: Diagnosis not present

## 2015-10-10 DIAGNOSIS — G3183 Dementia with Lewy bodies: Secondary | ICD-10-CM | POA: Diagnosis not present

## 2015-10-10 DIAGNOSIS — S6991XD Unspecified injury of right wrist, hand and finger(s), subsequent encounter: Secondary | ICD-10-CM | POA: Diagnosis not present

## 2015-10-10 DIAGNOSIS — F0280 Dementia in other diseases classified elsewhere without behavioral disturbance: Secondary | ICD-10-CM | POA: Diagnosis not present

## 2015-10-10 NOTE — Assessment & Plan Note (Addendum)
Assessment:  He continues to have diarrhea every few days, not associated with eating or with certain foods.  He may 1-2 explosive bowel movement, sometimes clear.  There is mucous in the stool but no blood.  He will then go a few days without any bowel movement.  His abdominal pain has resolved, he is eating and not vomiting so I think the ileus is improving.  His abdominal exam is benign with good bowel sounds.  He has not had recent abx use and no recent med changes.  I do not suspect cdiff since he is only having 1-2x movement and can go a few days w/o any diarrhea.   I do not suspect this is due to medications.  He does not have access to laxatives since daughter and nursing staff manage his medications. Plan:  Daughter provided with containers to collect stool samples for testing.  Will send stool culture and check common pathogens, O&P, fecal leukocytes, fecal lactoferrin, fecal fat.  Advised to avoid use of anti-diarrheal medication for now since he is recovering from ileus and until we complete above work-up.  RTC in 1 month for follow-up.

## 2015-10-13 ENCOUNTER — Ambulatory Visit (INDEPENDENT_AMBULATORY_CARE_PROVIDER_SITE_OTHER): Payer: Commercial Managed Care - HMO | Admitting: Pharmacist

## 2015-10-13 ENCOUNTER — Ambulatory Visit (INDEPENDENT_AMBULATORY_CARE_PROVIDER_SITE_OTHER): Payer: Commercial Managed Care - HMO | Admitting: *Deleted

## 2015-10-13 DIAGNOSIS — I2699 Other pulmonary embolism without acute cor pulmonale: Secondary | ICD-10-CM

## 2015-10-13 DIAGNOSIS — Z7901 Long term (current) use of anticoagulants: Secondary | ICD-10-CM | POA: Diagnosis not present

## 2015-10-13 DIAGNOSIS — R55 Syncope and collapse: Secondary | ICD-10-CM | POA: Diagnosis not present

## 2015-10-13 DIAGNOSIS — Z7902 Long term (current) use of antithrombotics/antiplatelets: Secondary | ICD-10-CM

## 2015-10-13 LAB — POCT INR: INR: 2.8

## 2015-10-13 NOTE — Patient Instructions (Signed)
Patient educated about medication as defined in this encounter and verbalized understanding by repeating back instructions provided.   

## 2015-10-13 NOTE — Progress Notes (Signed)
Anticoagulation Management Lowel Beston is a 82 y.o. male who reports to the clinic for monitoring of warfarin treatment.    Indication: PE Duration: indefinite  Anticoagulation Clinic Visit History: Patient does not report signs/symptoms of bleeding or thromboembolism, no other changes except for GI (ileus then diarrhea)   Anticoagulation Episode Summary    Current INR goal 2.0-3.0  Next INR check 10/27/2015  INR from last check 2.8 (10/13/2015)  Weekly max dose   Target end date Indefinite  INR check location [facility]  Preferred lab   Send INR reminders to [facility]   Indications  Pulmonary embolism ([place]) [I26.99] Encounter for long-term use of antiplatelets/antithrombotics [Z79.02]        Comments History of multiple venous embolic episodes. Will continue to annual re-evaluate continued need for warfarin weighing risks vs. benefits.       Anticoagulation Care Providers    Provider Role Specialty Phone number   Bertha Stakes, MD  Internal Medicine [phone]     ASSESSMENT Recent Results: Recent results are below, the most recent result is correlated with a dose of 40 mg per week: Lab Results  Component Value Date   INR 2.8 10/13/2015   INR 2.66* 09/18/2015   INR 2.33* 09/17/2015   INR today: Therapeutic  Anticoagulation Dosing: INR as of 10/13/2015 and Previous Dosing Information    INR Dt INR Goal [employer] Sun Mon Tue Wed Thu Fri Sat   10/13/2015 2.8 2.0-3.0 40 mg 5 mg 7.5 mg 5 mg 5 mg 7.5 mg 5 mg 5 mg    Anticoagulation Dose Instructions as of 10/13/2015      Total Sun Mon Tue Wed Thu Fri Sat   New Dose 40 mg 5 mg 7.5 mg 5 mg 5 mg 7.5 mg 5 mg 5 mg     (5 mg x 1)  (5 mg x 1.5)  (5 mg x 1)  (5 mg x 1)  (5 mg x 1.5)  (5 mg x 1)  (5 mg x 1)                           PLAN Weekly dose was unchanged  Patient Instructions  Patient educated about medication as defined in this encounter and verbalized understanding by repeating back instructions  provided.   Patient advised to contact clinic or seek medical attention if signs/symptoms of bleeding or thromboembolism occur.  Follow-up Return in about 2 weeks (around 10/27/2015) for Follow up INR 10/27/15 at 2:30pm.  [name],[name]

## 2015-10-13 NOTE — Progress Notes (Signed)
Internal Medicine Clinic Attending  Case discussed with Dr. Wilson soon after the resident saw the patient.  We reviewed the resident's history and exam and pertinent patient test results.  I agree with the assessment, diagnosis, and plan of care documented in the resident's note.  

## 2015-10-14 NOTE — Progress Notes (Signed)
Indication: Recurrent venous thromboembolism. Duration: Lifelong. INR: At target. Agree with Dr. Julianne Rice assessment and plan.

## 2015-10-14 NOTE — Progress Notes (Signed)
Carelink Summary Report / Loop Recorder 

## 2015-10-15 ENCOUNTER — Ambulatory Visit: Payer: Commercial Managed Care - HMO | Admitting: Podiatry

## 2015-10-20 ENCOUNTER — Encounter: Payer: Self-pay | Admitting: Cardiology

## 2015-10-20 ENCOUNTER — Ambulatory Visit (INDEPENDENT_AMBULATORY_CARE_PROVIDER_SITE_OTHER): Payer: Commercial Managed Care - HMO | Admitting: Cardiology

## 2015-10-20 VITALS — BP 128/68 | HR 72 | Ht 67.0 in | Wt 158.0 lb

## 2015-10-20 DIAGNOSIS — R55 Syncope and collapse: Secondary | ICD-10-CM

## 2015-10-20 DIAGNOSIS — I5022 Chronic systolic (congestive) heart failure: Secondary | ICD-10-CM

## 2015-10-20 DIAGNOSIS — I2510 Atherosclerotic heart disease of native coronary artery without angina pectoris: Secondary | ICD-10-CM | POA: Diagnosis not present

## 2015-10-20 DIAGNOSIS — R001 Bradycardia, unspecified: Secondary | ICD-10-CM

## 2015-10-20 DIAGNOSIS — I2583 Coronary atherosclerosis due to lipid rich plaque: Secondary | ICD-10-CM

## 2015-10-20 DIAGNOSIS — I10 Essential (primary) hypertension: Secondary | ICD-10-CM

## 2015-10-20 MED ORDER — ATORVASTATIN CALCIUM 20 MG PO TABS: 20.0000 mg | ORAL_TABLET | Freq: Every day | ORAL | Status: DC

## 2015-10-20 NOTE — Patient Instructions (Signed)
Medication Instructions:  Decrease Lipitor to 20 mg daily-all other medications remain the same.  Labwork: None  Testing/Procedures: None  Follow-Up: Your physician wants you to follow-up in: 6 months. You will receive a reminder letter in the mail two months in advance. If you don't receive a letter, please call our office to schedule the follow-up appointment.      If you need a refill on your cardiac medications before your next appointment, please call your pharmacy.

## 2015-10-20 NOTE — Progress Notes (Signed)
Cardiology Office Note  Date:  10/20/2015   ID:  [name] [name], DOB Feb 06, 1931, MRN [id number]  PCP:  [name] Maxin, DO  Cardiologist:  Dr. Ena [name]   Electrophysiologist:  Dr. Virl [name]   Chief complaint: Lower extremity edema, falls.   History of Present Illness: [name] [name] is a 82 y.o. male with a hx of nonobstructive CAD by cardiac catheterization in 2008, bradycardia, prior recurrent pulmonary embolism on chronic Coumadin, HTN, prostate CA, HL, depression, dementia.  Admitted 11/2014 with syncope and chest pain.  BP was 70/40 with heart rate in the 40s. He was placed on transcutaneous pacing and was noted have underlying sinus bradycardia and frequent PVCs followed by compensatory pauses. He was maintained on epinephrine and then dopamine. LHC demonstrated nonobstructive CAD. There was mid LAD myocardial bridging noted and he was placed on calcium channel blocker therapy.  Echocardiogram demonstrated EF 40-45%. He was seen by Dr. Caryl [name] with electrophysiology. Underlying conduction system disease was noted with RBBB, first-degree AV block and prior symptomatic bradycardia noted in May 2014 that improved off of [name] [name]. Ultimately decision was made to place a loop recorder instead of pacemaker.   The patient has been seen by [name] [name] in by Dr. Caryl [name] in October 2016 when he was stable. He is accompanied by his daughter who states that he has been falling more. He denies any palpitation chest pain or shortness of breath or blackouts during the falls. It's all attributed to mechanical falls as a result of his Parkinson disease. The daughter also states that he has becoming more forgetful and lately was hospitalized for ileus. He has chronic mild pitting edema around his ankles. No orthopnea, PND.    Studies/Reports Reviewed Today:  LHC 12/10/14 Left main: Large caliber normal long vessel which bifurcated into an LAD and left circumflex vessel.  LAD: pDx 40-50%  (small). LAD after Dx 50%; mid LAD with significant mid systolic bridging with narrowing up to 80% during systole that improved with IC nitroglycerin and with diastole 20% narrowed. LCx: Small tortuous vessel which gave rise to 2 OM branches.  RCA: Large dominant normal vessel that supplied the PDA and PLA vessel. Left ventriculography: EF 50-55%. There was ectopy without definitive wall motion abnormalities.  Echo 12/09/14 - EF 40% to 45%. Regional wall motion abnormalities cannot be excluded. Grade 1 diastolic dysfunction.   Past Medical History  Diagnosis Date  . PE (pulmonary embolism)     unprovoked PE completed 6 months of warfarin, warfarin d/ced 02/12/2010, repeat PE 07/10/2010 after a long car ride and now on lifelong coumadin.  [name] Hypertension   . Pituitary microadenoma ([place])     incidental finding CT 12/2009  . Prostate cancer ([place])   . Depression   . Hyperlipidemia   . Coronary artery disease, non-occlusive     with history of MI; Cath 2008 w multivessel nonobstructive CAD  . Dementia   . GERD (gastroesophageal reflux disease)   . Scoliosis   . Anxiety     Past Surgical History  Procedure Laterality Date  . Prostatectomy    . Left heart catheterization with coronary angiogram N/A 12/10/2014    Procedure: LEFT HEART CATHETERIZATION WITH CORONARY ANGIOGRAM;  Surgeon: [name] Sine, MD;  Location[facility] CATH LAB;  Service: Cardiovascular;  Laterality: N/A;  . Loop recorder implant N/A 12/16/2014    Procedure: LOOP RECORDER IMPLANT;  Surgeon: [name] Sprang, MD;  Location: [facility] CATH LAB;  Service: Cardiovascular;  Laterality: N/A;  Current Outpatient Prescriptions  Medication Sig Dispense Refill  . acetaminophen (TYLENOL) 500 MG tablet Take 500 mg by mouth every 4 (four) hours as needed for mild pain or headache.    [name] amLODipine (NORVASC) 2.5 MG tablet TAKE 1 TABLET (2.5 MG TOTAL) BY MOUTH DAILY. 90 tablet 3  . aspirin EC 81 MG EC tablet Take 1 tablet (81 mg total) by  mouth daily.    [name] b complex vitamins tablet Take 1 tablet by mouth daily.    . Calcium Carbonate-Vitamin D (CALCIUM-VITAMIN D) 500-200 MG-UNIT per tablet Take 1 tablet by mouth daily.    . carbidopa-levodopa (SINEMET IR) 25-100 MG tablet Take 1 tablet by mouth 2 (two) times daily. (breakfast and early afternoon) 180 tablet 1  . fish oil-omega-3 fatty acids 1000 MG capsule Take 2 g by mouth daily.     . furosemide (LASIX) 20 MG tablet Take 1 tablet (20 mg total) by mouth daily.    . memantine (NAMENDA XR) 28 MG CP24 24 hr capsule Take 28 mg by mouth 2 (two) times daily.    . Multiple Vitamins-Minerals (MULTIVITAMIN WITH MINERALS) tablet Take 1 tablet by mouth daily.    [name] omeprazole (PRILOSEC) 20 MG capsule TAKE 1 CAPSULE (20 MG TOTAL) BY MOUTH DAILY. 90 capsule 0  . potassium chloride (K-DUR) 10 MEQ tablet Take 1 tablet (10 mEq total) by mouth as directed. 1st dose take 2 tabs = 20 meq then decrease to 1 tab = 10 meq daily (Patient taking differently: Take 10 mEq by mouth daily. ) 90 tablet 3  . QUEtiapine (SEROQUEL) 25 MG tablet Take 0.5 tablets (12.5 mg total) by mouth at bedtime. 30 tablet 1  . vitamin C (ASCORBIC ACID) 500 MG tablet Take 500 mg by mouth daily.    [name] warfarin (COUMADIN) 5 MG tablet Take as directed. (Patient taking differently: Take 5-7.5 mg by mouth daily. 7.5mg  on Monday and Thursday and 5 mg all other days) 40 tablet 2  . atorvastatin (LIPITOR) 20 MG tablet Take 1 tablet (20 mg total) by mouth daily. 90 tablet 1   No current facility-administered medications for this visit.    Allergies:   [name] [name] and Other    Social History:  The patient  reports that he has never smoked. He has never used smokeless tobacco. He reports that he does not drink alcohol or use illicit drugs.   Family History:  The patient's family history includes Cervical cancer in his other; Dementia in his mother; Hypertension in his child and father; Lung cancer in his other; Stroke in his other. There is  no history of Heart attack.    ROS:   Please see the history of present illness.   Review of Systems  Gastrointestinal: Negative for diarrhea, hematochezia, melena, nausea and vomiting.  Genitourinary: Negative for hematuria.  Neurological: Positive for loss of balance.  Psychiatric/Behavioral: The patient is nervous/anxious.   All other systems reviewed and are negative.     PHYSICAL EXAM: VS:  BP 128/68 mmHg  Pulse 72  Ht 5\' 7"  (1.702 m)  Wt 158 lb (71.668 kg)  BMI 24.74 kg/m2    Wt Readings from Last 3 Encounters:  10/20/15 158 lb (71.668 kg)  10/09/15 158 lb 1.6 oz (71.714 kg)  09/23/15 156 lb 1.6 oz (70.806 kg)     GEN: Well nourished, well developed, in no acute distress, sleepy, sitting in a wheelchair. HEENT: normal Neck: no JVD at 90 degrees, no carotid bruits, no masses Cardiac:  Normal S1/S2, RRR; no murmur ,  no rubs or gallops, 1+ bilateral LE edema   Respiratory:  clear to auscultation bilaterally, no wheezing, rhonchi or rales. GI: soft, nontender, nondistended, + BS MS: no deformity or atrophy Skin: warm and dry  Neuro:  CNs II-XII intact, Strength and sensation are intact Psych: Normal affect   EKG:  EKG is ordered today.  It demonstrates:   NSR, HR 77, RBBB, LAD, PVCs, T-wave inversion in V3, V4, no change from prior tracings   Recent Labs: 12/07/2014: B Natriuretic Peptide 90.9 12/18/2014: TSH 0.504 09/16/2015: ALT 69*; Hemoglobin 12.7*; Platelets 168 09/18/2015: BUN 14; Creatinine, Ser 1.13; Magnesium 2.0; Potassium 3.5; Sodium 141    Lipid Panel    Component Value Date/Time   CHOL 207* 06/30/2012 1153   TRIG 129 06/30/2012 1153   HDL 56 06/30/2012 1153   CHOLHDL 3.7 06/30/2012 1153   VLDL 26 06/30/2012 1153   LDLCALC 125* 06/30/2012 1153      ASSESSMENT AND PLAN:  Chronic Systolic CHF:  Volume appears stable, but LE edema, advised to take an extra lasix in the pm if necessary. I suspect a lot of his edema is related to inactivity,  venous insufficiency and Amlodipine.    Syncope, unspecified syncope type:  No further syncope.   Symptomatic bradycardia:  He is not on AV nodal blocking agents. His heart rate is currently controlled.  ILR was interrogated today.  He had one brady episode with HR in the 40s last week.  No other arrhythmias noted.   Recurrent pulmonary embolism:  Coumadin is managed by primary care.  Coronary artery disease:  No angina.  Continue aspirin, Amlodipine, statin. No beta blocker given history of bradycardia.  HLD (hyperlipidemia):  Continue statin.  Essential hypertension, benign:  BP low today.  Adjust lasix as noted.  I will give her parameters to hold the patient's Norvasc if his SBP is 115 or less.    Cardiomyopathy:  EF 40-45% by recent echocardiogram. He is not on beta blocker due to history of bradycardia. He was placed on amlodipine given intramyocardial bridging.  He is off of ACE inhibitor due to low BP.  Alzheimer's dementia:  Follow-up with neurology as planned. I will decrease atorvastatin to 20 mg po daily.   Medication Changes: Current medicines are reviewed at length with the patient today.  Concerns regarding medicines are as outlined above.  The following changes have been made:   Discontinued Medications   ATORVASTATIN (LIPITOR) 40 MG TABLET    TAKE 1 TABLET (40 MG TOTAL) BY MOUTH EVERY EVENING.   CARBIDOPA-LEVODOPA (SINEMET IR) 25-100 MG PER TABLET    Take 1 tablet by mouth 2 (two) times daily.   MEMANTINE (NAMENDA) 10 MG TABLET    Take 1 tablet (10 mg total) by mouth 2 (two) times daily.   Modified Medications   No medications on file   New Prescriptions   ATORVASTATIN (LIPITOR) 20 MG TABLET    Take 1 tablet (20 mg total) by mouth daily.   Follow up in 6 months.   Dorothy Spark [facility] 10/20/2015 12:53 PM    [facility] [place], [place], [place]  [address] Phone: [phone]; Fax: [phone]

## 2015-10-24 ENCOUNTER — Telehealth: Payer: Self-pay | Admitting: Internal Medicine

## 2015-10-24 NOTE — Telephone Encounter (Signed)
APPT REMINDER CALL, MAILBOX FULL

## 2015-10-25 LAB — CUP PACEART REMOTE DEVICE CHECK: MDC IDC SESS DTM: 20161224000544

## 2015-10-27 ENCOUNTER — Ambulatory Visit: Payer: Commercial Managed Care - HMO

## 2015-10-28 ENCOUNTER — Emergency Department (HOSPITAL_COMMUNITY)
Admission: EM | Admit: 2015-10-28 | Discharge: 2015-10-28 | Disposition: A | Discharge status: Home / Self Care | Payer: Commercial Managed Care - HMO | Source: Home / Self Care | Attending: Family Medicine | Admitting: Family Medicine

## 2015-10-28 ENCOUNTER — Encounter (HOSPITAL_COMMUNITY): Payer: Self-pay | Admitting: Emergency Medicine

## 2015-10-28 ENCOUNTER — Telehealth: Payer: Self-pay | Admitting: Internal Medicine

## 2015-10-28 DIAGNOSIS — G20 Parkinson's disease: Secondary | ICD-10-CM | POA: Diagnosis not present

## 2015-10-28 DIAGNOSIS — W1839XA Other fall on same level, initial encounter: Secondary | ICD-10-CM

## 2015-10-28 DIAGNOSIS — W010XXA Fall on same level from slipping, tripping and stumbling without subsequent striking against object, initial encounter: Secondary | ICD-10-CM

## 2015-10-28 DIAGNOSIS — S0181XA Laceration without foreign body of other part of head, initial encounter: Secondary | ICD-10-CM | POA: Diagnosis not present

## 2015-10-28 MED ORDER — LIDOCAINE-EPINEPHRINE (PF) 2 %-1:200000 IJ SOLN
INTRAMUSCULAR | Status: AC
  Filled 2015-10-28: qty 20

## 2015-10-28 MED ORDER — LIDOCAINE HCL 2 % IJ SOLN
INTRAMUSCULAR | Status: AC
  Filled 2015-10-28: qty 20

## 2015-10-28 NOTE — ED Provider Notes (Signed)
CSN: [id number]     Arrival date & time 10/28/15  1745 History   First MD Initiated Contact with Patient 10/28/15 1932     Chief Complaint  Patient presents with  . Head Laceration   (Consider location/radiation/quality/duration/timing/severity/associated sxs/prior Treatment) HPI Comments: This 82-year-old male with Parkinson's disease tripped and fell this afternoon and struck his forehead on the floor. This produced a 20 cm laceration just above the left eyebrow. There was no loss of consciousness. He has remained awake and alert. He is partially demented due to the Parkinson's and most of his responses are mumbles. His daughter states she has not noticed any changes. No vomiting or other unusual symptoms.   Past Medical History  Diagnosis Date  . PE (pulmonary embolism)     unprovoked PE completed 6 months of warfarin, warfarin d/ced 02/12/2010, repeat PE 07/10/2010 after a long car ride and now on lifelong coumadin.  [name] Hypertension   . Pituitary microadenoma ([place])     incidental finding CT 12/2009  . Prostate cancer ([place])   . Depression   . Hyperlipidemia   . Coronary artery disease, non-occlusive     with history of MI; Cath 2008 w multivessel nonobstructive CAD  . Dementia   . GERD (gastroesophageal reflux disease)   . Scoliosis   . Anxiety    Past Surgical History  Procedure Laterality Date  . Prostatectomy    . Left heart catheterization with coronary angiogram N/A 12/10/2014    Procedure: LEFT HEART CATHETERIZATION WITH CORONARY ANGIOGRAM;  Surgeon: [name], MD;  Location: [facility] CATH LAB;  Service: Cardiovascular;  Laterality: N/A;  . Loop recorder implant N/A 12/16/2014    Procedure: LOOP RECORDER IMPLANT;  Surgeon: [name], MD;  Location: [facility] CATH LAB;  Service: Cardiovascular;  Laterality: N/A;   Family History  Problem Relation Age of Onset  . Hypertension Father     Passed away from cerebral hemorrhage at age of 58.  [name] Dementia Mother     Passed away  .  Hypertension Child     4 adult children  . Cervical cancer Other   . Lung cancer Other   . Stroke Other   . Heart attack Neg Hx    Social History  Substance Use Topics  . Smoking status: Never Smoker   . Smokeless tobacco: Never Used  . Alcohol Use: No    Review of Systems  Unable to perform ROS: Dementia  Constitutional: Negative for fever and activity change.  Eyes: Negative.   Gastrointestinal: Negative for vomiting.  Skin: Positive for wound.    Allergies  Aricept and Other  Home Medications   Prior to Admission medications   Medication Sig Start Date End Date Taking? Authorizing Provider  acetaminophen (TYLENOL) 500 MG tablet Take 500 mg by mouth every 4 (four) hours as needed for mild pain or headache.    Historical Provider, MD  amLODipine (NORVASC) 2.5 MG tablet TAKE 1 TABLET (2.5 MG TOTAL) BY MOUTH DAILY. 07/08/15   [name], MD  aspirin EC 81 MG EC tablet Take 1 tablet (81 mg total) by mouth daily. 12/17/14   Isaiah Serge, NP  atorvastatin (LIPITOR) 20 MG tablet Take 1 tablet (20 mg total) by mouth daily. 10/20/15   Dorothy Spark, MD  b complex vitamins tablet Take 1 tablet by mouth daily.    Historical Provider, MD  Calcium Carbonate-Vitamin D (CALCIUM-VITAMIN D) 500-200 MG-UNIT per tablet Take 1 tablet by mouth daily.  Historical Provider, MD  carbidopa-levodopa (SINEMET IR) 25-100 MG tablet Take 1 tablet by mouth 2 (two) times daily. (breakfast and early afternoon) 10/03/15   Garvin Fila, MD  fish oil-omega-3 fatty acids 1000 MG capsule Take 2 g by mouth daily.     Historical Provider, MD  furosemide (LASIX) 20 MG tablet Take 1 tablet (20 mg total) by mouth daily. 04/14/15   Liliane Shi, PA-C  memantine (NAMENDA XR) 28 MG CP24 24 hr capsule Take 28 mg by mouth 2 (two) times daily.    Historical Provider, MD  Multiple Vitamins-Minerals (MULTIVITAMIN WITH MINERALS) tablet Take 1 tablet by mouth daily.    Historical Provider, MD  omeprazole (PRILOSEC)  20 MG capsule TAKE 1 CAPSULE (20 MG TOTAL) BY MOUTH DAILY. 07/23/15   Francesca Oman, DO  potassium chloride (K-DUR) 10 MEQ tablet Take 1 tablet (10 mEq total) by mouth as directed. 1st dose take 2 tabs = 20 meq then decrease to 1 tab = 10 meq daily Patient taking differently: Take 10 mEq by mouth daily.  07/10/15   [name], MD  QUEtiapine (SEROQUEL) 25 MG tablet Take 0.5 tablets (12.5 mg total) by mouth at bedtime. 09/23/15   Garvin Fila, MD  vitamin C (ASCORBIC ACID) 500 MG tablet Take 500 mg by mouth daily.    Historical Provider, MD  warfarin (COUMADIN) 5 MG tablet Take as directed. Patient taking differently: Take 5-7.5 mg by mouth daily. 7.5mg  on Monday and Thursday and 5 mg all other days 07/10/15   Francesca Oman, DO   Meds Ordered and Administered this Visit  Medications - No data to display  BP 164/92 mmHg  Pulse 65  Temp(Src) 97.9 F (36.6 C) (Oral)  Resp 16  SpO2 97% No data found.   Physical Exam  Constitutional: He appears well-developed and well-nourished. No distress.  Eyes: Conjunctivae and EOM are normal. Pupils are equal, round, and reactive to light. Right eye exhibits no discharge. Left eye exhibits no discharge.  Neck:  Neck is somewhat stiff but this is chronic due to Parkinson's.  Pulmonary/Chest: Effort normal.  Musculoskeletal:  Rigid body movements. The patient has to be lifted up and placed on the table.  Neurological: He is alert.  Skin: Skin is warm and dry.  2.5 cm laceration over the left eyebrow.  Nursing note and vitals reviewed.   ED Course  .Marland KitchenLaceration Repair Date/Time: 10/28/2015 8:45 PM Performed by: Marcha Dutton Siler Mavis Authorized by: Ihor Gully D Consent: Verbal consent obtained. Consent given by: guardian Patient understanding: patient states understanding of the procedure being performed Patient identity confirmed: arm band Body area: head/neck Location details: left eyebrow Laceration length: 2.5 cm Foreign bodies: no foreign  bodies Tendon involvement: none Nerve involvement: none Vascular damage: no Anesthesia: local infiltration Local anesthetic: lidocaine 2% with epinephrine Anesthetic total: 3 ml Patient sedated: no Preparation: Patient was prepped and draped in the usual sterile fashion. Irrigation solution: saline Irrigation method: jet lavage Amount of cleaning: standard Debridement: none Degree of undermining: none Skin closure: 4-0 Prolene Number of sutures: 4 Technique: simple Approximation: close Approximation difficulty: simple Dressing: 4x4 sterile gauze Patient tolerance: Patient tolerated the procedure well with no immediate complications   (including critical care time)  Labs Review Labs Reviewed - No data to display  Imaging Review No results found.   Visual Acuity Review  Right Eye Distance:   Left Eye Distance:   Bilateral Distance:    Right Eye Near:   Left  Eye Near:    Bilateral Near:         MDM   1. Fall from slip, trip, or stumble, initial encounter   2. Laceration of forehead without complication, initial encounter   3. Parkinson's disease [facility])    Laceration care post repair Watch for infection RTO SR 6 days Head injury instructions    Janne Napoleon, NP 10/28/15 2105

## 2015-10-28 NOTE — Telephone Encounter (Signed)
Don from [place] home [facility] requesting verbal order to extend one visit to complete goal for transfer and ADL. Please call back.

## 2015-10-28 NOTE — ED Notes (Signed)
Last tetanus 2013

## 2015-10-28 NOTE — Discharge Instructions (Signed)
Facial Laceration ° A facial laceration is a cut on the face. These injuries can be painful and cause bleeding. Lacerations usually heal quickly, but they need special care to reduce scarring. °DIAGNOSIS  °Your health care provider will take a medical history, ask for details about how the injury occurred, and examine the wound to determine how deep the cut is. °TREATMENT  °Some facial lacerations may not require closure. Others may not be able to be closed because of an increased risk of infection. The risk of infection and the chance for successful closure will depend on various factors, including the amount of time since the injury occurred. °The wound may be cleaned to help prevent infection. If closure is appropriate, pain medicines may be given if needed. Your health care provider will use stitches (sutures), wound glue (adhesive), or skin adhesive strips to repair the laceration. These tools bring the skin edges together to allow for faster healing and a better cosmetic outcome. If needed, you may also be given a tetanus shot. °HOME CARE INSTRUCTIONS °· Only take over-the-counter or prescription medicines as directed by your health care provider. °· Follow your health care provider's instructions for wound care. These instructions will vary depending on the technique used for closing the wound. °For Sutures: °· Keep the wound clean and dry.   °· If you were given a bandage (dressing), you should change it at least once a day. Also change the dressing if it becomes wet or dirty, or as directed by your health care provider.   °· Wash the wound with soap and water 2 times a day. Rinse the wound off with water to remove all soap. Pat the wound dry with a clean towel.   °· After cleaning, apply a thin layer of the antibiotic ointment recommended by your health care provider. This will help prevent infection and keep the dressing from sticking.   °· You may shower as usual after the first 24 hours. Do not soak the  wound in water until the sutures are removed.   °· Get your sutures removed as directed by your health care provider. With facial lacerations, sutures should usually be taken out after 4-5 days to avoid stitch marks.   °· Wait a few days after your sutures are removed before applying any makeup. °For Skin Adhesive Strips: °· Keep the wound clean and dry.   °· Do not get the skin adhesive strips wet. You may bathe carefully, using caution to keep the wound dry.   °· If the wound gets wet, pat it dry with a clean towel.   °· Skin adhesive strips will fall off on their own. You may trim the strips as the wound heals. Do not remove skin adhesive strips that are still stuck to the wound. They will fall off in time.   °For Wound Adhesive: °· You may briefly wet your wound in the shower or bath. Do not soak or scrub the wound. Do not swim. Avoid periods of heavy sweating until the skin adhesive has fallen off on its own. After showering or bathing, gently pat the wound dry with a clean towel.   °· Do not apply liquid medicine, cream medicine, ointment medicine, or makeup to your wound while the skin adhesive is in place. This may loosen the film before your wound is healed.   °· If a dressing is placed over the wound, be careful not to apply tape directly over the skin adhesive. This may cause the adhesive to be pulled off before the wound is healed.   °· Avoid   prolonged exposure to sunlight or tanning lamps while the skin adhesive is in place. °· The skin adhesive will usually remain in place for 5-10 days, then naturally fall off the skin. Do not pick at the adhesive film.   °After Healing: °Once the wound has healed, cover the wound with sunscreen during the day for 1 full year. This can help minimize scarring. Exposure to ultraviolet light in the first year will darken the scar. It can take 1-2 years for the scar to lose its redness and to heal completely.  °SEEK MEDICAL CARE IF: °· You have a fever. °SEEK IMMEDIATE  MEDICAL CARE IF: °· You have redness, pain, or swelling around the wound.   °· You see a yellowish-white fluid (pus) coming from the wound.   °  °This information is not intended to replace advice given to you by your health care provider. Make sure you discuss any questions you have with your health care provider. °  °Document Released: 10/14/2004 Document Revised: 09/27/2014 Document Reviewed: 04/19/2013 °Elsevier Interactive Patient Education ©2016 [employer] ° °Head Injury, Adult °You have a head injury. Headaches and throwing up (vomiting) are common after a head injury. It should be easy to wake up from sleeping. Sometimes you must stay in the hospital. Most problems happen within the first 24 hours. Side effects may occur up to 7-10 days after the injury.  °WHAT ARE THE TYPES OF HEAD INJURIES? °Head injuries can be as minor as a bump. Some head injuries can be more severe. More severe head injuries include: °· A jarring injury to the brain (concussion). °· A bruise of the brain (contusion). This mean there is bleeding in the brain that can cause swelling. °· A cracked skull (skull fracture). °· Bleeding in the brain that collects, clots, and forms a bump (hematoma). °WHEN SHOULD I GET HELP RIGHT AWAY?  °· You are confused or sleepy. °· You cannot be woken up. °· You feel sick to your stomach (nauseous) or keep throwing up (vomiting). °· Your dizziness or unsteadiness is getting worse. °· You have very bad, lasting headaches that are not helped by medicine. Take medicines only as told by your doctor. °· You cannot use your arms or legs like normal. °· You cannot walk. °· You notice changes in the black spots in the center of the colored part of your eye (pupil). °· You have clear or bloody fluid coming from your nose or ears. °· You have trouble seeing. °During the next 24 hours after the injury, you must stay with someone who can watch you. This person should get help right away (call 911 in the [place] if  you start to shake and are not able to control it (have seizures), you pass out, or you are unable to wake up. °HOW CAN I PREVENT A HEAD INJURY IN THE FUTURE? °· Wear seat belts. °· Wear a helmet while bike riding and playing sports like football. °· Stay away from dangerous activities around the house. °WHEN CAN I RETURN TO NORMAL ACTIVITIES AND ATHLETICS? °See your doctor before doing these activities. You should not do normal activities or play contact sports until 1 week after the following symptoms have stopped: °· Headache that does not go away. °· Dizziness. °· Poor attention. °· Confusion. °· Memory problems. °· Sickness to your stomach or throwing up. °· Tiredness. °· Fussiness. °· Bothered by bright lights or loud noises. °· Anxiousness or depression. °· Restless sleep. °MAKE SURE YOU:  °· Understand these instructions. °· Will   watch your condition. °· Will get help right away if you are not doing well or get worse. °  °This information is not intended to replace advice given to you by your health care provider. Make sure you discuss any questions you have with your health care provider. °  °Document Released: 08/19/2008 Document Revised: 09/27/2014 Document Reviewed: 05/14/2013 °Elsevier Interactive Patient Education ©2016 [employer] Inc[employer] ° °

## 2015-10-28 NOTE — ED Notes (Signed)
Laceration above left eye brow.  It is reported as a witnessed fall, unsteady gait, witnessed by facility staff.

## 2015-10-29 ENCOUNTER — Telehealth: Payer: Self-pay | Admitting: Internal Medicine

## 2015-10-29 NOTE — Telephone Encounter (Signed)
Don from [facility] calls and would like 1 more visit w/ pt, verb given, do you agree?

## 2015-10-29 NOTE — Telephone Encounter (Signed)
Calling to let us know that they are discharged patient from their services on 10/28/15

## 2015-10-29 NOTE — Telephone Encounter (Signed)
Agree 

## 2015-10-30 ENCOUNTER — Emergency Department (HOSPITAL_COMMUNITY): Payer: Commercial Managed Care - HMO

## 2015-10-30 ENCOUNTER — Emergency Department (HOSPITAL_COMMUNITY)
Admission: EM | Admit: 2015-10-30 | Discharge: 2015-10-31 | Disposition: A | Discharge status: Home / Self Care | Payer: Commercial Managed Care - HMO | Attending: Emergency Medicine | Admitting: Emergency Medicine

## 2015-10-30 ENCOUNTER — Encounter (HOSPITAL_COMMUNITY): Payer: Self-pay | Admitting: Emergency Medicine

## 2015-10-30 DIAGNOSIS — Z7982 Long term (current) use of aspirin: Secondary | ICD-10-CM | POA: Diagnosis not present

## 2015-10-30 DIAGNOSIS — M7989 Other specified soft tissue disorders: Secondary | ICD-10-CM

## 2015-10-30 DIAGNOSIS — Z9889 Other specified postprocedural states: Secondary | ICD-10-CM | POA: Insufficient documentation

## 2015-10-30 DIAGNOSIS — M109 Gout, unspecified: Secondary | ICD-10-CM | POA: Diagnosis not present

## 2015-10-30 DIAGNOSIS — K219 Gastro-esophageal reflux disease without esophagitis: Secondary | ICD-10-CM | POA: Diagnosis not present

## 2015-10-30 DIAGNOSIS — Z8546 Personal history of malignant neoplasm of prostate: Secondary | ICD-10-CM | POA: Insufficient documentation

## 2015-10-30 DIAGNOSIS — F0390 Unspecified dementia without behavioral disturbance: Secondary | ICD-10-CM | POA: Diagnosis not present

## 2015-10-30 DIAGNOSIS — Z79899 Other long term (current) drug therapy: Secondary | ICD-10-CM | POA: Insufficient documentation

## 2015-10-30 DIAGNOSIS — Z86711 Personal history of pulmonary embolism: Secondary | ICD-10-CM | POA: Diagnosis not present

## 2015-10-30 DIAGNOSIS — Z7901 Long term (current) use of anticoagulants: Secondary | ICD-10-CM | POA: Insufficient documentation

## 2015-10-30 DIAGNOSIS — M419 Scoliosis, unspecified: Secondary | ICD-10-CM | POA: Insufficient documentation

## 2015-10-30 DIAGNOSIS — I2510 Atherosclerotic heart disease of native coronary artery without angina pectoris: Secondary | ICD-10-CM | POA: Diagnosis not present

## 2015-10-30 DIAGNOSIS — I252 Old myocardial infarction: Secondary | ICD-10-CM | POA: Insufficient documentation

## 2015-10-30 DIAGNOSIS — I10 Essential (primary) hypertension: Secondary | ICD-10-CM | POA: Diagnosis not present

## 2015-10-30 DIAGNOSIS — F329 Major depressive disorder, single episode, unspecified: Secondary | ICD-10-CM | POA: Diagnosis not present

## 2015-10-30 DIAGNOSIS — M10071 Idiopathic gout, right ankle and foot: Secondary | ICD-10-CM | POA: Diagnosis not present

## 2015-10-30 DIAGNOSIS — M25571 Pain in right ankle and joints of right foot: Secondary | ICD-10-CM | POA: Diagnosis present

## 2015-10-30 DIAGNOSIS — Z86018 Personal history of other benign neoplasm: Secondary | ICD-10-CM | POA: Diagnosis not present

## 2015-10-30 DIAGNOSIS — E785 Hyperlipidemia, unspecified: Secondary | ICD-10-CM | POA: Insufficient documentation

## 2015-10-30 NOTE — ED Notes (Signed)
Patient with right ankle swelling, noted today, did have fall yesterday. Patient is unable to answer questions. On exam do not swelling to right ankle into foot, tenderness with pressing to distal part of foot. Stitches noted to left eyebrow.

## 2015-10-30 NOTE — ED Provider Notes (Signed)
CSN: [id number]     Arrival date & time 10/30/15  2157 History   By signing my name below, I, Forrestine Him, attest that this documentation has been prepared under the direction and in the presence of Sharlett Iles, MD.  Electronically Signed: Forrestine Him, ED Scribe. 10/30/2015. 12:14 AM.   Chief Complaint  Patient presents with  . Ankle Pain   The history is provided by a relative. The history is limited by the condition of the patient. No language interpreter was used.    LEVEL 5 CAVEAT- DUE TO DEMENTIA; HISTORY PROVIDED BY FAMILY  HPI Comments: [name] [name] is a 82 y.o. male with a PMHx of PE, HTN, hyperlipidemia, GERD, Gout, and Dementia who presents to the Emergency Department complaining of constant, ongoing, worsening R ankle swelling with mild pain noted today. Pt sustained a fall 2 days ago. At that time, pt was evaluated in the Emergency Department and had 4 sutures placed to a laceration just above the L eyebrow. Family states R foot looks larger, however, at baseline, R foot is typically always larger than the L foot. Family states discomfort to foot is exacerbated by walking, without any alleviating factors. No OTC medications or home remedies attempted prior to arrival. Lasix medication was recently increased by pts cardiologist for LE edema. They state he was ambulatory without complaints of pain on the day of the fall as well as yesterday.  PCP: [name], DO    Past Medical History  Diagnosis Date  . PE (pulmonary embolism)     unprovoked PE completed 6 months of warfarin, warfarin d/ced 02/12/2010, repeat PE 07/10/2010 after a long car ride and now on lifelong coumadin.  [name] Hypertension   . Pituitary microadenoma ([place])     incidental finding CT 12/2009  . Prostate cancer ([place])   . Depression   . Hyperlipidemia   . Coronary artery disease, non-occlusive     with history of MI; Cath 2008 w multivessel nonobstructive CAD  . Dementia   . GERD (gastroesophageal  reflux disease)   . Scoliosis   . Anxiety    Past Surgical History  Procedure Laterality Date  . Prostatectomy    . Left heart catheterization with coronary angiogram N/A 12/10/2014    Procedure: LEFT HEART CATHETERIZATION WITH CORONARY ANGIOGRAM;  Surgeon: [name], MD;  Location: [facility] CATH LAB;  Service: Cardiovascular;  Laterality: N/A;  . Loop recorder implant N/A 12/16/2014    Procedure: LOOP RECORDER IMPLANT;  Surgeon: [name], MD;  Location: [facility] CATH LAB;  Service: Cardiovascular;  Laterality: N/A;   Family History  Problem Relation Age of Onset  . Hypertension Father     Passed away from cerebral hemorrhage at age of 57.  [name] Dementia Mother     Passed away  . Hypertension Child     4 adult children  . Cervical cancer Other   . Lung cancer Other   . Stroke Other   . Heart attack Neg Hx    Social History  Substance Use Topics  . Smoking status: Never Smoker   . Smokeless tobacco: Never Used  . Alcohol Use: No    Review of Systems  Unable to perform ROS: Dementia      Allergies  Aricept and Other  Home Medications   Prior to Admission medications   Medication Sig Start Date End Date Taking? Authorizing Provider  acetaminophen (TYLENOL) 500 MG tablet Take 500 mg by mouth every 4 (four) hours as needed  for mild pain or headache.    Historical Provider, MD  amLODipine (NORVASC) 2.5 MG tablet TAKE 1 TABLET (2.5 MG TOTAL) BY MOUTH DAILY. 07/08/15   [name], MD  aspirin EC 81 MG EC tablet Take 1 tablet (81 mg total) by mouth daily. 12/17/14   Isaiah Serge, NP  atorvastatin (LIPITOR) 20 MG tablet Take 1 tablet (20 mg total) by mouth daily. 10/20/15   Dorothy Spark, MD  b complex vitamins tablet Take 1 tablet by mouth daily.    Historical Provider, MD  Calcium Carbonate-Vitamin D (CALCIUM-VITAMIN D) 500-200 MG-UNIT per tablet Take 1 tablet by mouth daily.    Historical Provider, MD  carbidopa-levodopa (SINEMET IR) 25-100 MG tablet Take 1 tablet by  mouth 2 (two) times daily. (breakfast and early afternoon) 10/03/15   Garvin Fila, MD  fish oil-omega-3 fatty acids 1000 MG capsule Take 2 g by mouth daily.     Historical Provider, MD  furosemide (LASIX) 20 MG tablet Take 1 tablet (20 mg total) by mouth daily. 04/14/15   Liliane Shi, PA-C  memantine (NAMENDA XR) 28 MG CP24 24 hr capsule Take 28 mg by mouth 2 (two) times daily.    Historical Provider, MD  Multiple Vitamins-Minerals (MULTIVITAMIN WITH MINERALS) tablet Take 1 tablet by mouth daily.    Historical Provider, MD  omeprazole (PRILOSEC) 20 MG capsule TAKE 1 CAPSULE (20 MG TOTAL) BY MOUTH DAILY. 07/23/15   Francesca Oman, DO  potassium chloride (K-DUR) 10 MEQ tablet Take 1 tablet (10 mEq total) by mouth as directed. 1st dose take 2 tabs = 20 meq then decrease to 1 tab = 10 meq daily Patient taking differently: Take 10 mEq by mouth daily.  07/10/15   [name], MD  QUEtiapine (SEROQUEL) 25 MG tablet Take 0.5 tablets (12.5 mg total) by mouth at bedtime. 09/23/15   Garvin Fila, MD  vitamin C (ASCORBIC ACID) 500 MG tablet Take 500 mg by mouth daily.    Historical Provider, MD  warfarin (COUMADIN) 5 MG tablet Take as directed. Patient taking differently: Take 5-7.5 mg by mouth daily. 7.5mg  on Monday and Thursday and 5 mg all other days 07/10/15   Francesca Oman, DO   Triage Vitals: BP 108/77 mmHg  Pulse 78  Temp(Src) 98 F (36.7 C) (Oral)  Resp 18  SpO2 100%   Physical Exam  Constitutional: He appears well-developed and well-nourished. No distress.  Asleep   HENT:  Head: Normocephalic.  Moist mucous membranes Sutured laceration over L eyebrow No redness or drainage   Cardiovascular: Intact distal pulses.   Musculoskeletal: He exhibits edema.  2+ pitting edema ank to foot R >L with warmth of 1st MTP joint  Neurological:  Asleep, withdraws to pain  Skin: Skin is warm and dry.  Mild erythema and warmth over 1st MTP joint  Psychiatric:  calm  Nursing note and vitals  reviewed.   ED Course  Procedures (including critical care time)  DIAGNOSTIC STUDIES: Oxygen Saturation is 100% on RA, Normal by my interpretation.    COORDINATION OF CARE: 12:13 AM- Will order DG ankle complete R. Discussed treatment plan with pt at bedside and pt agreed to plan.     Labs Review Labs Reviewed - No data to display  Imaging Review Dg Ankle Complete Right  10/30/2015  CLINICAL DATA:  RIGHT ankle swelling.  Fell yesterday. EXAM: RIGHT ANKLE - COMPLETE 3+ VIEW COMPARISON:  None. FINDINGS: There is no evidence of fracture,  dislocation, or joint effusion. There is no evidence of arthropathy or other focal bone abnormality. Soft tissue swelling. IMPRESSION: Soft tissue swelling.  No fracture or dislocation. Electronically Signed   By: Staci Righter M.D.   On: 10/30/2015 22:41      MDM   Final diagnoses:  Acute gout of right foot, unspecified cause  Foot swelling   Pt w/ dementia p/w foot swelling and pain that family noticed this morning, 2 days after fall. He was ambulatory and without complaints yesterday after fall. On exam, R MTP joint warm, erythematous, and edematous. 2+ pedal pulse. Family states he has hx gout. His R foot is normally swollen and is a larger shoe size than L foot at baseline. Exam c/w gout flare, no erythema extending to foot to suggest cellulitis. Patient is on warfarin, no history of diabetes thus provided with prednisone instead of NSAIDs. I discussed with family the importance of keeping close watch over his symptoms as differential does include infection and I instructed to immediately return if any worsening symptoms. They voiced understanding of this plan. Instructed to follow-up with PCP to ensure resolution. Patient discharged in satisfactory condition.  I personally performed the services described in this documentation, which was scribed in my presence. The recorded information has been reviewed and is accurate.   Sharlett Iles,  MD 10/31/15 [phone]

## 2015-10-31 ENCOUNTER — Telehealth: Payer: Self-pay | Admitting: Internal Medicine

## 2015-10-31 ENCOUNTER — Telehealth: Payer: Self-pay

## 2015-10-31 DIAGNOSIS — I110 Hypertensive heart disease with heart failure: Secondary | ICD-10-CM | POA: Diagnosis not present

## 2015-10-31 DIAGNOSIS — F0280 Dementia in other diseases classified elsewhere without behavioral disturbance: Secondary | ICD-10-CM | POA: Diagnosis not present

## 2015-10-31 DIAGNOSIS — S6991XD Unspecified injury of right wrist, hand and finger(s), subsequent encounter: Secondary | ICD-10-CM | POA: Diagnosis not present

## 2015-10-31 DIAGNOSIS — R531 Weakness: Secondary | ICD-10-CM | POA: Diagnosis not present

## 2015-10-31 DIAGNOSIS — G3183 Dementia with Lewy bodies: Secondary | ICD-10-CM | POA: Diagnosis not present

## 2015-10-31 MED ORDER — PREDNISONE 20 MG PO TABS: 40.0000 mg | ORAL_TABLET | Freq: Every day | ORAL | Status: DC

## 2015-10-31 NOTE — Telephone Encounter (Signed)
Will call in to [facility], daughter aware.

## 2015-10-31 NOTE — Telephone Encounter (Signed)
rec'd call from Timmothy Sours, [place] with [facility].  Pt had a fall on Tuesday, has new laceration to the L eyebrown which required 4 stitches.  Pt also went to ED yesterday with L foot swelling, they gave a dose pack of prednisone.  Timmothy Sours would like to extend OT for 2 weeks.  1 visit/week.  Also would like PT to come and re-evaluate.  And would like nursing to come back for wound.  VO OK?

## 2015-10-31 NOTE — Discharge Instructions (Signed)
Gout °Gout is an inflammatory arthritis caused by a buildup of uric acid crystals in the joints. Uric acid is a chemical that is normally present in the blood. When the level of uric acid in the blood is too high it can form crystals that deposit in your joints and tissues. This causes joint redness, soreness, and swelling (inflammation). Repeat attacks are common. Over time, uric acid crystals can form into masses (tophi) near a joint, destroying bone and causing disfigurement. Gout is treatable and often preventable. °CAUSES  °The disease begins with elevated levels of uric acid in the blood. Uric acid is produced by your body when it breaks down a naturally found substance called purines. Certain foods you eat, such as meats and fish, contain high amounts of purines. Causes of an elevated uric acid level include: °· Being passed down from parent to child (heredity). °· Diseases that cause increased uric acid production (such as obesity, psoriasis, and certain cancers). °· Excessive alcohol use. °· Diet, especially diets rich in meat and seafood. °· Medicines, including certain cancer-fighting medicines (chemotherapy), water pills (diuretics), and aspirin. °· Chronic kidney disease. The kidneys are no longer able to remove uric acid well. °· Problems with metabolism. °Conditions strongly associated with gout include: °· Obesity. °· High blood pressure. °· High cholesterol. °· Diabetes. °Not everyone with elevated uric acid levels gets gout. It is not understood why some people get gout and others do not. Surgery, joint injury, and eating too much of certain foods are some of the factors that can lead to gout attacks. °SYMPTOMS  °· An attack of gout comes on quickly. It causes intense pain with redness, swelling, and warmth in a joint. °· Fever can occur. °· Often, only one joint is involved. Certain joints are more commonly involved: °· Base of the big toe. °· Knee. °· Ankle. °· Wrist. °· Finger. °Without  treatment, an attack usually goes away in a few days to weeks. Between attacks, you usually will not have symptoms, which is different from many other forms of arthritis. °DIAGNOSIS  °Your caregiver will suspect gout based on your symptoms and exam. In some cases, tests may be recommended. The tests may include: °· Blood tests. °· Urine tests. °· X-rays. °· Joint fluid exam. This exam requires a needle to remove fluid from the joint (arthrocentesis). Using a microscope, gout is confirmed when uric acid crystals are seen in the joint fluid. °TREATMENT  °There are two phases to gout treatment: treating the sudden onset (acute) attack and preventing attacks (prophylaxis). °· Treatment of an Acute Attack. °· Medicines are used. These include anti-inflammatory medicines or steroid medicines. °· An injection of steroid medicine into the affected joint is sometimes necessary. °· The painful joint is rested. Movement can worsen the arthritis. °· You may use warm or cold treatments on painful joints, depending which works best for you. °· Treatment to Prevent Attacks. °· If you suffer from frequent gout attacks, your caregiver may advise preventive medicine. These medicines are started after the acute attack subsides. These medicines either help your kidneys eliminate uric acid from your body or decrease your uric acid production. You may need to stay on these medicines for a very long time. °· The early phase of treatment with preventive medicine can be associated with an increase in acute gout attacks. For this reason, during the first few months of treatment, your caregiver may also advise you to take medicines usually used for acute gout treatment. Be sure you   understand your caregiver's directions. Your caregiver may make several adjustments to your medicine dose before these medicines are effective.  Discuss dietary treatment with your caregiver or dietitian. Alcohol and drinks high in sugar and fructose and foods  such as meat, poultry, and seafood can increase uric acid levels. Your caregiver or dietitian can advise you on drinks and foods that should be limited. HOME CARE INSTRUCTIONS   Do not take aspirin to relieve pain. This raises uric acid levels.  Only take over-the-counter or prescription medicines for pain, discomfort, or fever as directed by your caregiver.  Rest the joint as much as possible. When in bed, keep sheets and blankets off painful areas.  Keep the affected joint raised (elevated).  Apply warm or cold treatments to painful joints. Use of warm or cold treatments depends on which works best for you.  Use crutches if the painful joint is in your leg.  Drink enough fluids to keep your urine clear or pale yellow. This helps your body get rid of uric acid. Limit alcohol, sugary drinks, and fructose drinks.  Follow your dietary instructions. Pay careful attention to the amount of protein you eat. Your daily diet should emphasize fruits, vegetables, whole grains, and fat-free or low-fat milk products. Discuss the use of coffee, vitamin C, and cherries with your caregiver or dietitian. These may be helpful in lowering uric acid levels.  Maintain a healthy body weight. SEEK MEDICAL CARE IF:   You develop diarrhea, vomiting, or any side effects from medicines.  You do not feel better in 24 hours, or you are getting worse. SEEK IMMEDIATE MEDICAL CARE IF:   Your joint becomes suddenly more tender, and you have chills or a fever. MAKE SURE YOU:   Understand these instructions.  Will watch your condition.  Will get help right away if you are not doing well or get worse.   This information is not intended to replace advice given to you by your health care provider. Make sure you discuss any questions you have with your health care provider.   Document Released: 09/03/2000 Document Revised: 09/27/2014 Document Reviewed: 04/19/2012 Elsevier Interactive Patient Education 2016  Elsevier [employer]  Edema Edema is an abnormal buildup of fluids in your bodytissues. Edema is somewhatdependent on gravity to pull the fluid to the lowest place in your body. That makes the condition more common in the legs and thighs (lower extremities). Painless swelling of the feet and ankles is common and becomes more likely as you get older. It is also common in looser tissues, like around your eyes.  When the affected area is squeezed, the fluid may move out of that spot and leave a dent for a few moments. This dent is called pitting.  CAUSES  There are many possible causes of edema. Eating too much salt and being on your feet or sitting for a long time can cause edema in your legs and ankles. Hot weather may make edema worse. Common medical causes of edema include:  Heart failure.  Liver disease.  Kidney disease.  Weak blood vessels in your legs.  Cancer.  An injury.  Pregnancy.  Some medications.  Obesity. SYMPTOMS  Edema is usually painless.Your skin may look swollen or shiny.  DIAGNOSIS  Your health care provider may be able to diagnose edema by asking about your medical history and doing a physical exam. You may need to have tests such as X-rays, an electrocardiogram, or blood tests to check for medical conditions that may cause  edema.  TREATMENT  Edema treatment depends on the cause. If you have heart, liver, or kidney disease, you need the treatment appropriate for these conditions. General treatment may include:  Elevation of the affected body part above the level of your heart.  Compression of the affected body part. Pressure from elastic bandages or support stockings squeezes the tissues and forces fluid back into the blood vessels. This keeps fluid from entering the tissues.  Restriction of fluid and salt intake.  Use of a water pill (diuretic). These medications are appropriate only for some types of edema. They pull fluid out of your body and make you urinate  more often. This gets rid of fluid and reduces swelling, but diuretics can have side effects. Only use diuretics as directed by your health care provider. HOME CARE INSTRUCTIONS   Keep the affected body part above the level of your heart when you are lying down.   Do not sit still or stand for prolonged periods.   Do not put anything directly under your knees when lying down.  Do not wear constricting clothing or garters on your upper legs.   Exercise your legs to work the fluid back into your blood vessels. This may help the swelling go down.   Wear elastic bandages or support stockings to reduce ankle swelling as directed by your health care provider.   Eat a low-salt diet to reduce fluid if your health care provider recommends it.   Only take medicines as directed by your health care provider. SEEK MEDICAL CARE IF:   Your edema is not responding to treatment.  You have heart, liver, or kidney disease and notice symptoms of edema.  You have edema in your legs that does not improve after elevating them.   You have sudden and unexplained weight gain. SEEK IMMEDIATE MEDICAL CARE IF:   You develop shortness of breath or chest pain.   You cannot breathe when you lie down.  You develop pain, redness, or warmth in the swollen areas.   You have heart, liver, or kidney disease and suddenly get edema.  You have a fever and your symptoms suddenly get worse. MAKE SURE YOU:   Understand these instructions.  Will watch your condition.  Will get help right away if you are not doing well or get worse.   This information is not intended to replace advice given to you by your health care provider. Make sure you discuss any questions you have with your health care provider.   Document Released: 09/06/2005 Document Revised: 09/27/2014 Document Reviewed: 06/29/2013 Elsevier Interactive Patient Education [employer].

## 2015-10-31 NOTE — Telephone Encounter (Signed)
Pt's daughter states Pt was in the Ed last night and misplaced his script for his Prednisone and would like another script writen if possible.

## 2015-11-03 DIAGNOSIS — F0280 Dementia in other diseases classified elsewhere without behavioral disturbance: Secondary | ICD-10-CM | POA: Diagnosis not present

## 2015-11-03 DIAGNOSIS — S6991XD Unspecified injury of right wrist, hand and finger(s), subsequent encounter: Secondary | ICD-10-CM | POA: Diagnosis not present

## 2015-11-03 DIAGNOSIS — I110 Hypertensive heart disease with heart failure: Secondary | ICD-10-CM | POA: Diagnosis not present

## 2015-11-03 DIAGNOSIS — G3183 Dementia with Lewy bodies: Secondary | ICD-10-CM | POA: Diagnosis not present

## 2015-11-03 DIAGNOSIS — R531 Weakness: Secondary | ICD-10-CM | POA: Diagnosis not present

## 2015-11-04 ENCOUNTER — Encounter (HOSPITAL_COMMUNITY): Payer: Self-pay | Admitting: Emergency Medicine

## 2015-11-04 ENCOUNTER — Emergency Department (INDEPENDENT_AMBULATORY_CARE_PROVIDER_SITE_OTHER)
Admission: EM | Admit: 2015-11-04 | Discharge: 2015-11-04 | Disposition: A | Discharge status: Home / Self Care | Payer: Commercial Managed Care - HMO | Source: Home / Self Care | Attending: Family Medicine | Admitting: Family Medicine

## 2015-11-04 DIAGNOSIS — Z4802 Encounter for removal of sutures: Secondary | ICD-10-CM

## 2015-11-04 NOTE — ED Provider Notes (Signed)
CSN: [id number]     Arrival date & time 11/04/15  1611 History   First MD Initiated Contact with Patient 11/04/15 1701     Chief Complaint  Patient presents with  . Suture / Staple Removal   (Consider location/radiation/quality/duration/timing/severity/associated sxs/prior Treatment) HPI Sutures placed 7 days ago here in [facility] after a bathroom fall, causing a laceration above the left eyebrow. Past Medical History  Diagnosis Date  . PE (pulmonary embolism)     unprovoked PE completed 6 months of warfarin, warfarin d/ced 02/12/2010, repeat PE 07/10/2010 after a long car ride and now on lifelong coumadin.  [name] Hypertension   . Pituitary microadenoma ([place])     incidental finding CT 12/2009  . Prostate cancer ([place])   . Depression   . Hyperlipidemia   . Coronary artery disease, non-occlusive     with history of MI; Cath 2008 w multivessel nonobstructive CAD  . Dementia   . GERD (gastroesophageal reflux disease)   . Scoliosis   . Anxiety    Past Surgical History  Procedure Laterality Date  . Prostatectomy    . Left heart catheterization with coronary angiogram N/A 12/10/2014    Procedure: LEFT HEART CATHETERIZATION WITH CORONARY ANGIOGRAM;  Surgeon: [name], MD;  Location: [facility] CATH LAB;  Service: Cardiovascular;  Laterality: N/A;  . Loop recorder implant N/A 12/16/2014    Procedure: LOOP RECORDER IMPLANT;  Surgeon: [name], MD;  Location: [facility] CATH LAB;  Service: Cardiovascular;  Laterality: N/A;   Family History  Problem Relation Age of Onset  . Hypertension Father     Passed away from cerebral hemorrhage at age of 28.  [name] Dementia Mother     Passed away  . Hypertension Child     4 adult children  . Cervical cancer Other   . Lung cancer Other   . Stroke Other   . Heart attack Neg Hx    Social History  Substance Use Topics  . Smoking status: Never Smoker   . Smokeless tobacco: Never Used  . Alcohol Use: No    Review of Systems Suture removal no other  complaints Allergies  Aricept and Other  Home Medications   Prior to Admission medications   Medication Sig Start Date End Date Taking? Authorizing Provider  acetaminophen (TYLENOL) 500 MG tablet Take 500 mg by mouth every 4 (four) hours as needed for mild pain or headache.    Historical Provider, MD  amLODipine (NORVASC) 2.5 MG tablet TAKE 1 TABLET (2.5 MG TOTAL) BY MOUTH DAILY. 07/08/15   [name], MD  aspirin EC 81 MG EC tablet Take 1 tablet (81 mg total) by mouth daily. 12/17/14   Isaiah Serge, NP  atorvastatin (LIPITOR) 20 MG tablet Take 1 tablet (20 mg total) by mouth daily. 10/20/15   Dorothy Spark, MD  b complex vitamins tablet Take 1 tablet by mouth daily.    Historical Provider, MD  Calcium Carbonate-Vitamin D (CALCIUM-VITAMIN D) 500-200 MG-UNIT per tablet Take 1 tablet by mouth daily.    Historical Provider, MD  carbidopa-levodopa (SINEMET IR) 25-100 MG tablet Take 1 tablet by mouth 2 (two) times daily. (breakfast and early afternoon) 10/03/15   Garvin Fila, MD  fish oil-omega-3 fatty acids 1000 MG capsule Take 2 g by mouth daily.     Historical Provider, MD  furosemide (LASIX) 20 MG tablet Take 1 tablet (20 mg total) by mouth daily. 04/14/15   Liliane Shi, PA-C  memantine (NAMENDA XR) 28  MG CP24 24 hr capsule Take 28 mg by mouth 2 (two) times daily.    Historical Provider, MD  Multiple Vitamins-Minerals (MULTIVITAMIN WITH MINERALS) tablet Take 1 tablet by mouth daily.    Historical Provider, MD  omeprazole (PRILOSEC) 20 MG capsule TAKE 1 CAPSULE (20 MG TOTAL) BY MOUTH DAILY. 07/23/15   Francesca Oman, DO  potassium chloride (K-DUR) 10 MEQ tablet Take 1 tablet (10 mEq total) by mouth as directed. 1st dose take 2 tabs = 20 meq then decrease to 1 tab = 10 meq daily Patient taking differently: Take 10 mEq by mouth daily.  07/10/15   [name], MD  predniSONE (DELTASONE) 20 MG tablet Take 2 tablets (40 mg total) by mouth daily. Take 40 mg by mouth daily for 3 days, then  20mg  by mouth daily for 3 days, then 10mg  daily for 3 days 10/31/15   Sharlett Iles, MD  QUEtiapine (SEROQUEL) 25 MG tablet Take 0.5 tablets (12.5 mg total) by mouth at bedtime. 09/23/15   Garvin Fila, MD  vitamin C (ASCORBIC ACID) 500 MG tablet Take 500 mg by mouth daily.    Historical Provider, MD  warfarin (COUMADIN) 5 MG tablet Take as directed. Patient taking differently: Take 5-7.5 mg by mouth daily. 7.5mg  on Monday and Thursday and 5 mg all other days 07/10/15   Francesca Oman, DO   Meds Ordered and Administered this Visit  Medications - No data to display  BP 172/96 mmHg  Pulse 62  Temp(Src) 99.1 F (37.3 C) (Oral)  Resp 16  SpO2 95% No data found.   Physical Exam  Constitutional: He is oriented to person, place, and time.  HENT:  Head:    Pulmonary/Chest: Effort normal.  Neurological: He is alert and oriented to person, place, and time.  Skin: Skin is warm and dry.  Psychiatric: He has a normal mood and affect. His behavior is normal.  Nursing note and vitals reviewed.   ED Course  .Suture Removal Date/Time: 11/05/2015 5:44 PM Performed by: Konrad Felix Authorized by: Ihor Gully D Consent: Verbal consent obtained. Consent given by: patient Patient identity confirmed: verbally with patient and arm band Body area: head/neck Location details: left eyebrow Wound Appearance: clean Sutures Removed: 3 Post-removal: antibiotic ointment applied Facility: sutures placed in this facility Patient tolerance: Patient tolerated the procedure well with no immediate complications   (including critical care time)  Labs Review Labs Reviewed - No data to display  Imaging Review No results found.   Visual Acuity Review  Right Eye Distance:   Left Eye Distance:   Bilateral Distance:    Right Eye Near:   Left Eye Near:    Bilateral Near:         MDM   1. Visit for suture removal    Patient is advised to continue home symptomatic treatment.   Patient is advised that if there are new or worsening symptoms or attend the emergency department, or contact primary care provider. Instructions of care provided discharged home in stable condition. Return to work/school note provided.  THIS NOTE WAS GENERATED USING A VOICE RECOGNITION SOFTWARE PROGRAM. ALL REASONABLE EFFORTS  WERE MADE TO PROOFREAD THIS DOCUMENT FOR ACCURACY.     Konrad Felix, [place] 11/05/15 1744

## 2015-11-04 NOTE — ED Notes (Signed)
Pt is here for a f/u and to have stitches removed right above left eyebrow Voices no concerns Daughter at bedside... Brought back in wheel chair A&O x4... No acute distress.

## 2015-11-04 NOTE — Discharge Instructions (Signed)

## 2015-11-04 NOTE — ED Notes (Signed)
Pt d/c by [facility] pat[facility], pa

## 2015-11-05 ENCOUNTER — Telehealth: Payer: Self-pay | Admitting: Neurology

## 2015-11-05 DIAGNOSIS — F0280 Dementia in other diseases classified elsewhere without behavioral disturbance: Secondary | ICD-10-CM | POA: Diagnosis not present

## 2015-11-05 DIAGNOSIS — G3183 Dementia with Lewy bodies: Secondary | ICD-10-CM | POA: Diagnosis not present

## 2015-11-05 DIAGNOSIS — I110 Hypertensive heart disease with heart failure: Secondary | ICD-10-CM | POA: Diagnosis not present

## 2015-11-05 DIAGNOSIS — S6991XD Unspecified injury of right wrist, hand and finger(s), subsequent encounter: Secondary | ICD-10-CM | POA: Diagnosis not present

## 2015-11-05 DIAGNOSIS — Z4802 Encounter for removal of sutures: Secondary | ICD-10-CM | POA: Diagnosis not present

## 2015-11-05 DIAGNOSIS — R531 Weakness: Secondary | ICD-10-CM | POA: Diagnosis not present

## 2015-11-05 NOTE — Telephone Encounter (Signed)
Daughter [name] [phone] called to advise, patient has moments where he is extremely agitated, states Dr. Leonie Man had mentioned, possible Haldol. Also advises QUEtiapine (SEROQUEL) 25 MG tablet sometimes works and sometimes  doesn't, asks if Seroquel can be increased from half pill to a whole pill?

## 2015-11-05 NOTE — Telephone Encounter (Signed)
Agree 

## 2015-11-05 NOTE — Telephone Encounter (Signed)
Yes try increasing seroquel to 1 pill and call back

## 2015-11-06 ENCOUNTER — Other Ambulatory Visit: Payer: Self-pay | Admitting: Neurology

## 2015-11-06 DIAGNOSIS — R451 Restlessness and agitation: Secondary | ICD-10-CM

## 2015-11-06 DIAGNOSIS — R531 Weakness: Secondary | ICD-10-CM | POA: Diagnosis not present

## 2015-11-06 DIAGNOSIS — S6991XD Unspecified injury of right wrist, hand and finger(s), subsequent encounter: Secondary | ICD-10-CM | POA: Diagnosis not present

## 2015-11-06 DIAGNOSIS — F0280 Dementia in other diseases classified elsewhere without behavioral disturbance: Secondary | ICD-10-CM | POA: Diagnosis not present

## 2015-11-06 DIAGNOSIS — G3183 Dementia with Lewy bodies: Secondary | ICD-10-CM | POA: Diagnosis not present

## 2015-11-06 DIAGNOSIS — I110 Hypertensive heart disease with heart failure: Secondary | ICD-10-CM | POA: Diagnosis not present

## 2015-11-06 MED ORDER — QUETIAPINE FUMARATE 25 MG PO TABS: 25.0000 mg | ORAL_TABLET | Freq: Every day | ORAL | Status: DC

## 2015-11-06 NOTE — Telephone Encounter (Signed)
Rn call patients daughter [name] back. Rn stated per Dr.Sethi advice he wants the patient to increase the pill 1, not a half. Pts daughter stated they will need a refill on the medication for the whole pill. Message will be sent to [place].

## 2015-11-06 NOTE — Telephone Encounter (Signed)
Ok done

## 2015-11-09 ENCOUNTER — Other Ambulatory Visit: Payer: Self-pay | Admitting: Internal Medicine

## 2015-11-10 ENCOUNTER — Ambulatory Visit (INDEPENDENT_AMBULATORY_CARE_PROVIDER_SITE_OTHER): Payer: Commercial Managed Care - HMO | Admitting: Pharmacist

## 2015-11-10 DIAGNOSIS — Z7901 Long term (current) use of anticoagulants: Secondary | ICD-10-CM | POA: Diagnosis not present

## 2015-11-10 DIAGNOSIS — Z7902 Long term (current) use of antithrombotics/antiplatelets: Secondary | ICD-10-CM

## 2015-11-10 DIAGNOSIS — I2782 Chronic pulmonary embolism: Secondary | ICD-10-CM | POA: Diagnosis not present

## 2015-11-10 LAB — POCT INR: INR: 3.2

## 2015-11-10 NOTE — Patient Instructions (Signed)
Patient instructed to take medications as defined in the Anti-coagulation Track section of this encounter.  Patient instructed to take today's dose.  Patient verbalized understanding of these instructions.    

## 2015-11-10 NOTE — Progress Notes (Signed)
Anti-Coagulation Progress Note  [name] [name] is a 82 y.o. male who is currently on an anti-coagulation regimen.    RECENT RESULTS: Recent results are below, the most recent result is correlated with a dose of 40 mg. per week: Lab Results  Component Value Date   INR 3.2 11/10/2015   INR 2.8 10/13/2015   INR 2.66* 09/18/2015    ANTI-COAG DOSE: Anticoagulation Dose Instructions as of 11/10/2015      [name] Tue Wed Thu Fri Sat   New Dose 5 mg 5 mg 5 mg 7.5 mg 5 mg 5 mg 5 mg       ANTICOAG SUMMARY: Anticoagulation Episode Summary    Current INR goal 2.0-3.0  Next INR check 12/01/2015  INR from last check 3.2! (11/10/2015)  Weekly max dose   Target end date Indefinite  [facility]  Preferred lab   Send INR reminders to ANTICOAG IMP   Indications  Pulmonary embolism ([place]) [I26.99] Encounter for long-term use of antiplatelets/antithrombotics [Z79.02]        Comments History of multiple venous embolic episodes. Will continue to annual re-evaluate continued need for warfarin weighing risks vs. benefits.       Anticoagulation Care Providers    Provider Role Specialty Phone number   Bertha Stakes, MD  Internal Medicine [phone]      ANTICOAG TODAY: Anticoagulation Summary as of 11/10/2015    INR goal 2.0-3.0  Selected INR 3.2! (11/10/2015)  Next INR check 12/01/2015  Target end date Indefinite   Indications  Pulmonary embolism ([place]) [I26.99] Encounter for long-term use of antiplatelets/antithrombotics [Z79.02]      Anticoagulation Episode Summary    [facility]   Preferred lab    Send INR reminders to ANTICOAG IMP   Comments History of multiple venous embolic episodes. Will continue to annual re-evaluate continued need for warfarin weighing risks vs. benefits.     Anticoagulation Care Providers    Provider Role Specialty Phone number   Bertha Stakes, MD  Internal Medicine [phone]      PATIENT  INSTRUCTIONS: Patient Instructions  Patient instructed to take medications as defined in the Anti-coagulation Track section of this encounter.  Patient instructed to take today's dose.  Patient verbalized understanding of these instructions.      FOLLOW-UP Return in about 3 weeks (around 12/01/2015) for Follow up INR at [place], [name] Pharm.D., CACP

## 2015-11-11 ENCOUNTER — Ambulatory Visit (INDEPENDENT_AMBULATORY_CARE_PROVIDER_SITE_OTHER): Payer: Commercial Managed Care - HMO | Admitting: *Deleted

## 2015-11-11 DIAGNOSIS — R55 Syncope and collapse: Secondary | ICD-10-CM

## 2015-11-11 DIAGNOSIS — I110 Hypertensive heart disease with heart failure: Secondary | ICD-10-CM | POA: Diagnosis not present

## 2015-11-11 DIAGNOSIS — S6991XD Unspecified injury of right wrist, hand and finger(s), subsequent encounter: Secondary | ICD-10-CM | POA: Diagnosis not present

## 2015-11-11 DIAGNOSIS — G3183 Dementia with Lewy bodies: Secondary | ICD-10-CM | POA: Diagnosis not present

## 2015-11-11 DIAGNOSIS — F0280 Dementia in other diseases classified elsewhere without behavioral disturbance: Secondary | ICD-10-CM | POA: Diagnosis not present

## 2015-11-11 DIAGNOSIS — R531 Weakness: Secondary | ICD-10-CM | POA: Diagnosis not present

## 2015-11-12 ENCOUNTER — Telehealth: Payer: Self-pay | Admitting: Internal Medicine

## 2015-11-12 DIAGNOSIS — G3183 Dementia with Lewy bodies: Secondary | ICD-10-CM | POA: Diagnosis not present

## 2015-11-12 DIAGNOSIS — F0280 Dementia in other diseases classified elsewhere without behavioral disturbance: Secondary | ICD-10-CM | POA: Diagnosis not present

## 2015-11-12 DIAGNOSIS — I110 Hypertensive heart disease with heart failure: Secondary | ICD-10-CM | POA: Diagnosis not present

## 2015-11-12 DIAGNOSIS — S6991XD Unspecified injury of right wrist, hand and finger(s), subsequent encounter: Secondary | ICD-10-CM | POA: Diagnosis not present

## 2015-11-12 DIAGNOSIS — R531 Weakness: Secondary | ICD-10-CM | POA: Diagnosis not present

## 2015-11-12 NOTE — Telephone Encounter (Signed)
APPT. REMINDER CALL, LMTCB IF HE NEEDS TO CANCEL °

## 2015-11-12 NOTE — Progress Notes (Signed)
Carelink Summary Report / Loop Recorder 

## 2015-11-13 ENCOUNTER — Ambulatory Visit (INDEPENDENT_AMBULATORY_CARE_PROVIDER_SITE_OTHER): Payer: Commercial Managed Care - HMO | Admitting: Internal Medicine

## 2015-11-13 ENCOUNTER — Encounter: Payer: Self-pay | Admitting: Internal Medicine

## 2015-11-13 VITALS — BP 129/89 | HR 74 | Temp 98.3°F | Ht 67.0 in | Wt 159.1 lb

## 2015-11-13 DIAGNOSIS — R197 Diarrhea, unspecified: Secondary | ICD-10-CM

## 2015-11-13 DIAGNOSIS — G20 Parkinson's disease: Secondary | ICD-10-CM | POA: Diagnosis not present

## 2015-11-13 DIAGNOSIS — K591 Functional diarrhea: Secondary | ICD-10-CM

## 2015-11-13 NOTE — Patient Instructions (Signed)
1. Please make a follow up appointment for 4 weeks. PLEASE come see [name] sooner if you have pain or bleeding in your stools.   2. Please take all medications as previously prescribed.  3. If you have worsening of your symptoms or new symptoms arise, please call the clinic [phone]), or go to the ER immediately if symptoms are severe.  You have done a great job in taking all your medications. Please continue to do this.

## 2015-11-13 NOTE — Progress Notes (Signed)
I have reviewed anticoagulation teams note.  INR elevated.  Indication is PE.  Coumadin decreased.

## 2015-11-13 NOTE — Progress Notes (Signed)
Subjective:   Patient ID: [name] [name] male   DOB: 1931/05/07 82 y.o.   MRN: [id number]  HPI: [name] is a 82 y.o. male w/ PMHx of Parkinson's Disease, PE on Coumadin, HTN, h/o Prostate CA, HLD, CAD, GERD, anxiety, and depression, presents to the clinic today for an acute visit for intermittent loose stools. Patient has been seen for this on 10/10/2015 at which time the patient was sent home with containers to collect stool samples for common pathogens, leukocytes, lactoferrin, etc. The daughter states she has been unable to collect these stool samples because he will take the hat out of the toilet and she is frequently at work and not able to take samples to the clinic.   Today, she states he is still having similar issues. He will go 2-3 days without a stool and will then have a large, mostly loose stool, followed by at least 1-2 more stools that day. She also mentions some mucus in the stool. She claims that this all started back in November, and the patient had an admission for ileus in 08/2015 in which he was made NPO and his symptoms resolved. CT abdomen was performed during that visit which showed partial small bowel obstruction vs ileus with a suspected transition zone in the terminal ileum as well as bowel gas in the colon, in addition to a large amount of stool. Per the patient (confirmed by the daughter), he is not having any pain, no decrease in his appetite, no fevers, no blood in his stools, no nausea, or vomiting.   Also of note: Patient is taking Sinemet 25-100 bid for his significant movement disturbance related to his Parkinson's. The daughter states that she thinks his dose was increased in October but looking back through his neurology notes it does not appear that this has been the case and it is actually documented that a dose increase is not recommended at that time (note from 05/06/16).   Past Medical History  Diagnosis Date  . PE (pulmonary embolism)    unprovoked PE completed 6 months of warfarin, warfarin d/ced 02/12/2010, repeat PE 07/10/2010 after a long car ride and now on lifelong coumadin.  [name] Hypertension   . Pituitary microadenoma ([place])     incidental finding CT 12/2009  . Prostate cancer ([place])   . Depression   . Hyperlipidemia   . Coronary artery disease, non-occlusive     with history of MI; Cath 2008 w multivessel nonobstructive CAD  . Dementia   . GERD (gastroesophageal reflux disease)   . Scoliosis   . Anxiety    Current Outpatient Prescriptions  Medication Sig Dispense Refill  . acetaminophen (TYLENOL) 500 MG tablet Take 500 mg by mouth every 4 (four) hours as needed for mild pain or headache.    [name] amLODipine (NORVASC) 2.5 MG tablet TAKE 1 TABLET (2.5 MG TOTAL) BY MOUTH DAILY. 90 tablet 3  . aspirin EC 81 MG EC tablet Take 1 tablet (81 mg total) by mouth daily.    [name] atorvastatin (LIPITOR) 20 MG tablet Take 1 tablet (20 mg total) by mouth daily. 90 tablet 1  . b complex vitamins tablet Take 1 tablet by mouth daily.    . Calcium Carbonate-Vitamin D (CALCIUM-VITAMIN D) 500-200 MG-UNIT per tablet Take 1 tablet by mouth daily.    . carbidopa-levodopa (SINEMET IR) 25-100 MG tablet Take 1 tablet by mouth 2 (two) times daily. (breakfast and early afternoon) 180 tablet 1  . fish oil-omega-3 fatty acids 1000  MG capsule Take 2 g by mouth daily.     . furosemide (LASIX) 20 MG tablet Take 1 tablet (20 mg total) by mouth daily.    . memantine (NAMENDA XR) 28 MG CP24 24 hr capsule Take 28 mg by mouth 2 (two) times daily.    . Multiple Vitamins-Minerals (MULTIVITAMIN WITH MINERALS) tablet Take 1 tablet by mouth daily.    [name] omeprazole (PRILOSEC) 20 MG capsule TAKE 1 CAPSULE (20 MG TOTAL) BY MOUTH DAILY. 90 capsule 0  . potassium chloride (K-DUR) 10 MEQ tablet Take 1 tablet (10 mEq total) by mouth as directed. 1st dose take 2 tabs = 20 meq then decrease to 1 tab = 10 meq daily (Patient taking differently: Take 10 mEq by mouth daily. ) 90  tablet 3  . predniSONE (DELTASONE) 20 MG tablet Take 2 tablets (40 mg total) by mouth daily. Take 40 mg by mouth daily for 3 days, then 20mg  by mouth daily for 3 days, then 10mg  daily for 3 days 12 tablet 0  . QUEtiapine (SEROQUEL) 25 MG tablet Take 1 tablet (25 mg total) by mouth at bedtime. 30 tablet 1  . vitamin C (ASCORBIC ACID) 500 MG tablet Take 500 mg by mouth daily.    [name] warfarin (COUMADIN) 5 MG tablet Take as directed. (Patient taking differently: Take 5-7.5 mg by mouth daily. 7.5mg  on Monday and Thursday and 5 mg all other days) 40 tablet 2   No current facility-administered medications for this visit.    Review of Systems: General: Denies fever, chills, diaphoresis, appetite change and fatigue.  Respiratory: Denies SOB, DOE, cough, and wheezing.   Cardiovascular: Denies chest pain and palpitations.  Gastrointestinal: Positive for intermittent diarrhea/constipation. Denies nausea, vomiting, abdominal pain.  Genitourinary: Denies dysuria, increased frequency, and flank pain. Endocrine: Denies hot or cold intolerance, polyuria, and polydipsia. Musculoskeletal: Denies myalgias, back pain, joint swelling, arthralgias and gait problem.  Skin: Denies pallor, rash and wounds.  Neurological: Denies dizziness, seizures, syncope, weakness, lightheadedness, numbness and headaches.  Psychiatric/Behavioral: Denies mood changes, and sleep disturbances.   Objective:   Physical Exam: Filed Vitals:   11/13/15 1429  BP: 129/89  Pulse: 74  Temp: 98.3 F (36.8 C)  TempSrc: Oral  Height: 5\' 7"  (1.702 m)  Weight: 159 lb 1.6 oz (72.167 kg)  SpO2: 98%    General: Very pleasant man, flat affect, alert, cooperative, NAD. Able to answer questions with some delay. Mild tremor.  HEENT: PERRL, EOMI. Moist mucus membranes Neck: Full range of motion without pain, supple, no lymphadenopathy or carotid bruits Lungs: Clear to ascultation bilaterally, normal work of respiration, no wheezes, rales,  rhonchi Heart: RRR, no murmurs, gallops, or rubs Abdomen: Soft, non-tender, non-distended, BS + Extremities: No cyanosis, clubbing, or edema Neurologic: Alert, strength grossly intact, sensation intact to light touch   Assessment & Plan:   Please see problem based assessment and plan.

## 2015-11-14 NOTE — Progress Notes (Signed)
Medicine attending: Medical history, presenting problems, physical findings, and medications, reviewed with resident physician Dr Luanne Bras on the day of the patient visit and I concur with his evaluation and management plan which we discussed at length.Dr Ronnald Ramp suspects we are seeing bowel dysmotility due to anticholinergic effects of Sinemet. Which is an essential med for this man. Symptoms are intermittent. If he develops signs of obstruction again, we will get GI involved.

## 2015-11-14 NOTE — Assessment & Plan Note (Addendum)
Patient accompanied by his very caring daughter who states that for some time (5-6 months), patient has had intermittent constipation (won't have a BM for 2-3 days) followed by a very large, loose stool and then 1-2 more throughout the course of the day. States that there is some mucus in the stool as well. Patient and daughter deny ANY blood in the stool, abdominal pain, nausea, vomiting, fever, chills, decrease in his appetite or change in his weight. Does not seem to be an infectious or inflammatory type of situation, given the complete absence of other symptoms. Patient was admitted to the hospital in 08/2015 for what was found to be a partial small bowel obstruction vs ileus at that time. CT scan from that admission read the following: Moderate distention of the stomach and proximal small bowel fluid levels compatible with a ileus or a partial distal small bowel obstruction. There may be a transition point in low abdomen. No associated mass lesion is present. Also significant for moderate distention of the colon with extensive stool to the level of the sigmoid colon.  Since his discharge, patient has continued to have the intermittent, large, loose stools but with NO pain, blood, or nausea. Looking back through previous notes, it seems constipation has been an issue for some time. Given the fact that he is taking Sinemet (can frequently result in constipation/ileus), I suspect that this may be causing an adynamic ileus resulting in periodic symptoms similar to that of a mechanical obstruction without the presence of such.  -Given that the patient looks very comfortable clinically, has not had any abdominal pain, nausea, change in weight, decreased appetite, or blood in his stool, I would suggest we continue to observe for now.  -Advised daughter to still try and bring in stool sample for testing -Daughter will discuss with neurologist to consider changing medication or dosage if this continues to be an  issue or becomes worse.  -IF patient experiences pain, nausea, or blood in stool, he should call the clinic or come to the ED immediately.  -If this becomes any worse, would have very low threshold to consider re-imaging, small bowel contrast study, or referral to GI to determine if there may actually be an associated mass lesion.  -Patient will return in 4 weeks

## 2015-11-17 DIAGNOSIS — R531 Weakness: Secondary | ICD-10-CM | POA: Diagnosis not present

## 2015-11-17 DIAGNOSIS — I110 Hypertensive heart disease with heart failure: Secondary | ICD-10-CM | POA: Diagnosis not present

## 2015-11-17 DIAGNOSIS — S6991XD Unspecified injury of right wrist, hand and finger(s), subsequent encounter: Secondary | ICD-10-CM | POA: Diagnosis not present

## 2015-11-17 DIAGNOSIS — G3183 Dementia with Lewy bodies: Secondary | ICD-10-CM | POA: Diagnosis not present

## 2015-11-17 DIAGNOSIS — F0280 Dementia in other diseases classified elsewhere without behavioral disturbance: Secondary | ICD-10-CM | POA: Diagnosis not present

## 2015-11-20 DIAGNOSIS — G3183 Dementia with Lewy bodies: Secondary | ICD-10-CM | POA: Diagnosis not present

## 2015-11-20 DIAGNOSIS — S6991XD Unspecified injury of right wrist, hand and finger(s), subsequent encounter: Secondary | ICD-10-CM | POA: Diagnosis not present

## 2015-11-20 DIAGNOSIS — F0280 Dementia in other diseases classified elsewhere without behavioral disturbance: Secondary | ICD-10-CM | POA: Diagnosis not present

## 2015-11-20 DIAGNOSIS — I110 Hypertensive heart disease with heart failure: Secondary | ICD-10-CM | POA: Diagnosis not present

## 2015-11-20 DIAGNOSIS — R531 Weakness: Secondary | ICD-10-CM | POA: Diagnosis not present

## 2015-11-24 LAB — CUP PACEART REMOTE DEVICE CHECK: Date Time Interrogation Session: 20170123003541

## 2015-11-24 NOTE — Progress Notes (Signed)
Carelink summary report received. Battery status OK. Normal device function. No new symptom episodes, tachy episodes, brady, or pause episodes. No new AF episodes. Monthly summary reports and ROV/PRN 

## 2015-11-27 ENCOUNTER — Encounter: Payer: Self-pay | Admitting: Podiatry

## 2015-11-27 ENCOUNTER — Ambulatory Visit (INDEPENDENT_AMBULATORY_CARE_PROVIDER_SITE_OTHER): Payer: Commercial Managed Care - HMO | Admitting: Podiatry

## 2015-11-27 DIAGNOSIS — M79676 Pain in unspecified toe(s): Secondary | ICD-10-CM

## 2015-11-27 DIAGNOSIS — B351 Tinea unguium: Secondary | ICD-10-CM

## 2015-11-27 NOTE — Progress Notes (Signed)
Patient ID: [name] [name], male   DOB: Jul 28, 1931, 82 y.o.   MRN: [id number] Complaint:  Visit Type: Patient returns to my office for continued preventative foot care services. Complaint: Patient states" my nails have grown long and thick and become painful to walk and wear shoes" . The patient presents for preventative foot care services. No changes to ROS  Podiatric Exam: Vascular: dorsalis pedis and posterior tibial pulses are palpable bilateral. Capillary return is immediate. Temperature gradient is WNL. Skin turgor WNL  Sensorium: Normal Semmes Weinstein monofilament test. Normal tactile sensation bilaterally. Nail Exam: Pt has thick disfigured discolored nails with subungual debris noted bilateral entire nail hallux through fifth toenails Ulcer Exam: There is no evidence of ulcer or pre-ulcerative changes or infection. Orthopedic Exam: Muscle tone and strength are WNL. No limitations in general ROM. No crepitus or effusions noted. Foot type and digits show no abnormalities. Bony prominences are unremarkable. Skin: No Porokeratosis. No infection or ulcers  Diagnosis:  Onychomycosis, , Pain in right toe, pain in left toes  Treatment & Plan Procedures and Treatment: Consent by patient was obtained for treatment procedures. The patient understood the discussion of treatment and procedures well. All questions were answered thoroughly reviewed. Debridement of mycotic and hypertrophic toenails, 1 through 5 bilateral and clearing of subungual debris. No ulceration, no infection noted.  Return Visit-Office Procedure: Patient instructed to return to the office for a follow up visit 10 weeks  for continued evaluation and treatment.    Gardiner Barefoot DPM

## 2015-12-01 ENCOUNTER — Ambulatory Visit: Payer: Commercial Managed Care - HMO

## 2015-12-01 DIAGNOSIS — H5210 Myopia, unspecified eye: Secondary | ICD-10-CM | POA: Diagnosis not present

## 2015-12-01 DIAGNOSIS — H524 Presbyopia: Secondary | ICD-10-CM | POA: Diagnosis not present

## 2015-12-01 LAB — CUP PACEART REMOTE DEVICE CHECK: Date Time Interrogation Session: 20170222003636

## 2015-12-01 NOTE — Progress Notes (Signed)
Carelink summary report received. Battery status OK. Normal device function. No new symptom episodes, tachy episodes, brady, or pause episodes. No new AF episodes. Monthly summary reports and ROV/PRN 

## 2015-12-02 ENCOUNTER — Other Ambulatory Visit: Payer: Self-pay | Admitting: Internal Medicine

## 2015-12-08 ENCOUNTER — Ambulatory Visit (INDEPENDENT_AMBULATORY_CARE_PROVIDER_SITE_OTHER): Payer: Commercial Managed Care - HMO | Admitting: Pharmacist

## 2015-12-08 DIAGNOSIS — I2782 Chronic pulmonary embolism: Secondary | ICD-10-CM

## 2015-12-08 DIAGNOSIS — Z7902 Long term (current) use of antithrombotics/antiplatelets: Secondary | ICD-10-CM

## 2015-12-08 DIAGNOSIS — Z7901 Long term (current) use of anticoagulants: Secondary | ICD-10-CM

## 2015-12-08 LAB — POCT INR: INR: 3.2

## 2015-12-08 NOTE — Progress Notes (Signed)
INTERNAL MEDICINE TEACHING ATTENDING ADDENDUM - Aldine Contes M.D  Duration- indefinite, Indication- recurrent PE, INR- supratherapeutic. Agree with pharmacy recommendations as outlined in their note.

## 2015-12-08 NOTE — Progress Notes (Signed)
Anti-Coagulation Progress Note  [name] [name] is a 82 y.o. male who is currently on an anti-coagulation regimen.    RECENT RESULTS: Recent results are below, the most recent result is correlated with a dose of 35 mg. per week: Lab Results  Component Value Date   INR 3.2 12/08/2015   INR 3.2 11/10/2015   INR 2.8 10/13/2015    ANTI-COAG DOSE: Anticoagulation Dose Instructions as of 12/08/2015      [name] Tue Wed Thu Fri Sat   New Dose 5 mg 2.5 mg 5 mg 7.5 mg 5 mg 5 mg 5 mg       ANTICOAG SUMMARY: Anticoagulation Episode Summary    Current INR goal 2.0-3.0  Next INR check 12/29/2015  INR from last check 3.2! (12/08/2015)  Weekly max dose   Target end date Indefinite  [facility]  Preferred lab   Send INR reminders to ANTICOAG IMP   Indications  Pulmonary embolism ([place]) [I26.99] Encounter for long-term use of antiplatelets/antithrombotics [Z79.02]        Comments History of multiple venous embolic episodes. Will continue to annual re-evaluate continued need for warfarin weighing risks vs. benefits.       Anticoagulation Care Providers    Provider Role Specialty Phone number   Bertha Stakes, MD  Internal Medicine [phone]      ANTICOAG TODAY: Anticoagulation Summary as of 12/08/2015    INR goal 2.0-3.0  Selected INR 3.2! (12/08/2015)  Next INR check 12/29/2015  Target end date Indefinite   Indications  Pulmonary embolism ([place]) [I26.99] Encounter for long-term use of antiplatelets/antithrombotics [Z79.02]      Anticoagulation Episode Summary    [facility]   Preferred lab    Send INR reminders to ANTICOAG IMP   Comments History of multiple venous embolic episodes. Will continue to annual re-evaluate continued need for warfarin weighing risks vs. benefits.     Anticoagulation Care Providers    Provider Role Specialty Phone number   Bertha Stakes, MD  Internal Medicine [phone]      PATIENT  INSTRUCTIONS: Patient Instructions  Patient instructed to take medications as defined in the Anti-coagulation Track section of this encounter.  Patient instructed to take today's dose.  Patient verbalized understanding of these instructions.        FOLLOW-UP Return in about 3 weeks (around 12/29/2015) for f/u INR at 2pm.  Jorene Guest, [name] Pharm.D., CACP  Dimitri Ped, PharmD.

## 2015-12-08 NOTE — Patient Instructions (Signed)
Patient instructed to take medications as defined in the Anti-coagulation Track section of this encounter.  Patient instructed to take today's dose.  Patient verbalized understanding of these instructions.    

## 2015-12-11 ENCOUNTER — Ambulatory Visit: Payer: Commercial Managed Care - HMO | Admitting: *Deleted

## 2015-12-11 ENCOUNTER — Ambulatory Visit: Payer: Commercial Managed Care - HMO | Admitting: Internal Medicine

## 2015-12-11 DIAGNOSIS — I639 Cerebral infarction, unspecified: Secondary | ICD-10-CM

## 2015-12-18 ENCOUNTER — Other Ambulatory Visit: Payer: Self-pay | Admitting: Neurology

## 2015-12-26 ENCOUNTER — Telehealth: Payer: Self-pay | Admitting: Internal Medicine

## 2015-12-26 NOTE — Telephone Encounter (Signed)
APPT. REMINDER CALL, LMTCB °

## 2015-12-28 ENCOUNTER — Other Ambulatory Visit: Payer: Self-pay | Admitting: Neurology

## 2015-12-29 ENCOUNTER — Encounter: Payer: Self-pay | Admitting: Internal Medicine

## 2015-12-29 ENCOUNTER — Ambulatory Visit (INDEPENDENT_AMBULATORY_CARE_PROVIDER_SITE_OTHER): Payer: Commercial Managed Care - HMO | Admitting: Pharmacist

## 2015-12-29 ENCOUNTER — Ambulatory Visit (INDEPENDENT_AMBULATORY_CARE_PROVIDER_SITE_OTHER): Payer: Commercial Managed Care - HMO | Admitting: Internal Medicine

## 2015-12-29 VITALS — BP 140/108 | HR 68 | Temp 98.0°F | Wt 159.7 lb

## 2015-12-29 DIAGNOSIS — Z7902 Long term (current) use of antithrombotics/antiplatelets: Secondary | ICD-10-CM

## 2015-12-29 DIAGNOSIS — G219 Secondary parkinsonism, unspecified: Secondary | ICD-10-CM

## 2015-12-29 DIAGNOSIS — K591 Functional diarrhea: Secondary | ICD-10-CM

## 2015-12-29 DIAGNOSIS — Z7901 Long term (current) use of anticoagulants: Secondary | ICD-10-CM | POA: Diagnosis not present

## 2015-12-29 DIAGNOSIS — F0280 Dementia in other diseases classified elsewhere without behavioral disturbance: Secondary | ICD-10-CM

## 2015-12-29 DIAGNOSIS — I2782 Chronic pulmonary embolism: Secondary | ICD-10-CM | POA: Diagnosis not present

## 2015-12-29 DIAGNOSIS — G309 Alzheimer's disease, unspecified: Secondary | ICD-10-CM

## 2015-12-29 LAB — POCT INR: INR: 2.5

## 2015-12-29 NOTE — Patient Instructions (Signed)
Patient instructed to take medications as defined in the Anti-coagulation Track section of this encounter.  Patient instructed to take today's dose.  Patient verbalized understanding of these instructions.    

## 2015-12-29 NOTE — Progress Notes (Signed)
Anti-Coagulation Progress Note  Tammie Fearing is a 82 y.o. male who is currently on an anti-coagulation regimen.    RECENT RESULTS: Recent results are below, the most recent result is correlated with a dose of 35 mg. per week: Lab Results  Component Value Date   INR 2.50 12/29/2015   INR 3.2 12/08/2015   INR 3.2 11/10/2015    ANTI-COAG DOSE: Anticoagulation Dose Instructions as of 12/29/2015      [name] Tue Wed Thu Fri Sat   New Dose 5 mg 2.5 mg 5 mg 7.5 mg 5 mg 5 mg 5 mg       ANTICOAG SUMMARY: Anticoagulation Episode Summary    Current INR goal 2.0-3.0  Next INR check 01/19/2016  INR from last check 2.50 (12/29/2015)  Weekly max dose   Target end date Indefinite  [facility]  Preferred lab   Send INR reminders to ANTICOAG IMP   Indications  Pulmonary embolism ([place]) [I26.99] Encounter for long-term use of antiplatelets/antithrombotics [Z79.02]        Comments History of multiple venous embolic episodes. Will continue to annual re-evaluate continued need for warfarin weighing risks vs. benefits.       Anticoagulation Care Providers    Provider Role Specialty Phone number   Bertha Stakes, MD  Internal Medicine [phone]      ANTICOAG TODAY: Anticoagulation Summary as of 12/29/2015    INR goal 2.0-3.0  Selected INR 2.50 (12/29/2015)  Next INR check 01/19/2016  Target end date Indefinite   Indications  Pulmonary embolism ([place]) [I26.99] Encounter for long-term use of antiplatelets/antithrombotics [Z79.02]      Anticoagulation Episode Summary    [facility] check [facility]   Preferred lab    Send INR reminders to ANTICOAG IMP   Comments History of multiple venous embolic episodes. Will continue to annual re-evaluate continued need for warfarin weighing risks vs. benefits.     Anticoagulation Care Providers    Provider Role Specialty Phone number   Bertha Stakes, MD  Internal Medicine [phone]      PATIENT  INSTRUCTIONS: Patient Instructions  Patient instructed to take medications as defined in the Anti-coagulation Track section of this encounter.  Patient instructed to take today's dose.  Patient verbalized understanding of these instructions.      FOLLOW-UP Return in about 3 weeks (around 01/19/2016) for F/u INR @ 215pm.  Jorene Guest, [name] Pharm.D., CACP

## 2015-12-29 NOTE — Progress Notes (Signed)
Subjective:   Patient ID: [name] [name] male   DOB: Sep 02, 1931 82 y.o.   MRN: [id number]  HPI: [name] [name] is a 82 y.o. man with PMHx detailed below presenting for follow up of his diarrhea. He has been suffering for several months with intermittent constipation and diarrhea. This has neither improved nor worsened in frequency but he does have some abdominal pain during periods of constipation.  See problem based assessment and plan below for additional details.  Past Medical History  Diagnosis Date  . PE (pulmonary embolism)     unprovoked PE completed 6 months of warfarin, warfarin d/ced 02/12/2010, repeat PE 07/10/2010 after a long car ride and now on lifelong coumadin.  [name] Hypertension   . Pituitary microadenoma ([place])     incidental finding CT 12/2009  . Prostate cancer ([place])   . Depression   . Hyperlipidemia   . Coronary artery disease, non-occlusive     with history of MI; Cath 2008 w multivessel nonobstructive CAD  . Dementia   . GERD (gastroesophageal reflux disease)   . Scoliosis   . Anxiety    Current Outpatient Prescriptions  Medication Sig Dispense Refill  . acetaminophen (TYLENOL) 500 MG tablet Take 500 mg by mouth every 4 (four) hours as needed for mild pain or headache.    [name] amLODipine (NORVASC) 2.5 MG tablet TAKE 1 TABLET (2.5 MG TOTAL) BY MOUTH DAILY. 90 tablet 3  . aspirin EC 81 MG EC tablet Take 1 tablet (81 mg total) by mouth daily.    [name] atorvastatin (LIPITOR) 20 MG tablet Take 1 tablet (20 mg total) by mouth daily. 90 tablet 1  . b complex vitamins tablet Take 1 tablet by mouth daily.    . Calcium Carbonate-Vitamin D (CALCIUM-VITAMIN D) 500-200 MG-UNIT per tablet Take 1 tablet by mouth daily.    . carbidopa-levodopa (SINEMET IR) 25-100 MG tablet Take 1 tablet by mouth 2 (two) times daily. (breakfast and early afternoon) 180 tablet 1  . fish oil-omega-3 fatty acids 1000 MG capsule Take 2 g by mouth daily.     . furosemide (LASIX) 20 MG tablet Take 1  tablet (20 mg total) by mouth daily.    . Multiple Vitamins-Minerals (MULTIVITAMIN WITH MINERALS) tablet Take 1 tablet by mouth daily.    [name] NAMENDA XR 28 MG CP24 24 hr capsule TAKE ONE CAPSULE BY MOUTH EVERY DAY 30 capsule 6  . omeprazole (PRILOSEC) 20 MG capsule TAKE 1 CAPSULE (20 MG TOTAL) BY MOUTH DAILY. 90 capsule 0  . potassium chloride (K-DUR) 10 MEQ tablet Take 1 tablet (10 mEq total) by mouth as directed. 1st dose take 2 tabs = 20 meq then decrease to 1 tab = 10 meq daily (Patient taking differently: Take 10 mEq by mouth daily. ) 90 tablet 3  . predniSONE (DELTASONE) 20 MG tablet Take 2 tablets (40 mg total) by mouth daily. Take 40 mg by mouth daily for 3 days, then 20mg  by mouth daily for 3 days, then 10mg  daily for 3 days 12 tablet 0  . QUEtiapine (SEROQUEL) 25 MG tablet Take 1 tablet (25 mg total) by mouth at bedtime. 90 tablet 1  . vitamin C (ASCORBIC ACID) 500 MG tablet Take 500 mg by mouth daily.    [name] warfarin (COUMADIN) 5 MG tablet TAKE AS DIRECTED. 40 tablet 3   No current facility-administered medications for this visit.   Family History  Problem Relation Age of Onset  . Hypertension Father     Passed away  from cerebral hemorrhage at age of 75.  [name] Dementia Mother     Passed away  . Hypertension Child     4 adult children  . Cervical cancer Other   . Lung cancer Other   . Stroke Other   . Heart attack Neg Hx    Social History   Social History  . Marital Status: Married    Spouse Name: N/A  . Number of Children: 4  . Years of Education: master's   Occupational History  . Retired     retired   Social History Main Topics  . Smoking status: Never Smoker   . Smokeless tobacco: Never Used  . Alcohol Use: No  . Drug Use: No  . Sexual Activity: Yes   Other Topics Concern  . None   Social History Narrative   Lives with daughter. Wife also has severe dementia.    Drinks 1 cup of coffee a day    Review of Systems: Review of Systems  Respiratory: Negative for  shortness of breath.   Cardiovascular: Negative for leg swelling.  Gastrointestinal: Positive for abdominal pain, diarrhea and constipation. Negative for blood in stool.  Genitourinary: Positive for frequency.  Skin: Negative for rash.  Psychiatric/Behavioral: Positive for memory loss.    Objective:  Physical Exam: Filed Vitals:   12/29/15 1459  BP: 140/108  Pulse: 68  Temp: 98 F (36.7 C)  TempSrc: Oral  Weight: 159 lb 11.2 oz (72.439 kg)  SpO2: 97%   GENERAL- flat affect, pronounced delay answering questions, NAD HEENT- Oral mucosa appears moist CARDIAC- RRR, no murmurs, rubs or gallops. RESP- CTAB, no wheezes or crackles. ABDOMEN- Firm, nontender, bowel sounds present throughout EXTREMITIES- symmetric, no pedal edema. SKIN- Warm, dry, No rash or lesion. PSYCH- Delay interactions but appropriate mood, some difficulty recalling events  Assessment & Plan:

## 2015-12-29 NOTE — Patient Instructions (Signed)
Unfortunately your constipation and diarrhea are most likely due to a functional problem where the intestinal tract is unable to move contents forward effectively. It is also possible for a mechanical obstruction to be present in which case procedural intervention to remove this would be of benefit.  In either case further imaging workup would be needed either with a swallowed contrast motility study or with a colonoscopy.  I would like you have you evaluated by a GI doctor to decide the most appropriate long term plan of action.

## 2015-12-30 ENCOUNTER — Other Ambulatory Visit: Payer: Self-pay

## 2015-12-30 DIAGNOSIS — R451 Restlessness and agitation: Secondary | ICD-10-CM

## 2015-12-30 MED ORDER — QUETIAPINE FUMARATE 25 MG PO TABS: 25.0000 mg | ORAL_TABLET | Freq: Every day | ORAL | Status: DC

## 2015-12-30 NOTE — Assessment & Plan Note (Addendum)
Previous abdominal CT in 08/2015 suggested ileus vs obstruction deficit in the distal small intestine and large stool burden accumulated in the colon. He was seen last on 2/23 with history fully reviewed and continued with observation alone at that time due to lack of concerning associated signs or distressing patient symptoms.  Since then the patient experiences some abdominal pain during constipation episodes but not always. There continues to be no blood in stools or nausea/vomiting.  He has contributing factors that could be leading to an adynamic colon even with no mass effect present, including Sinemet which is an essential medication for his advanced psychiatric disease. His next appointment with Neurology to discuss medication management is 7/5 with Dr. Leonie Man.   -With mild symptoms and no bleeding definitely does not need emergent workup -Due to his ongoing symptoms without any improvement after many months with possibly worsening now (pain is new?) needs some investigation. Small bowel contrast study may be useful but I think referral to a specialist for evaluation would be appropriate, since if they think mass effect is likely enough could proceed directly to endoscopy. -He was previously seen by Dr. Collene Mares and was not a candidate for colonoscopy as potential benefits did justify procedural risk at that time. -RTC approximately 4 weeks

## 2016-01-02 ENCOUNTER — Other Ambulatory Visit: Payer: Self-pay | Admitting: Internal Medicine

## 2016-01-06 ENCOUNTER — Encounter: Payer: Self-pay | Admitting: *Deleted

## 2016-01-12 ENCOUNTER — Ambulatory Visit (INDEPENDENT_AMBULATORY_CARE_PROVIDER_SITE_OTHER): Payer: Commercial Managed Care - HMO | Admitting: *Deleted

## 2016-01-12 DIAGNOSIS — R55 Syncope and collapse: Secondary | ICD-10-CM

## 2016-01-12 NOTE — Progress Notes (Signed)
Carelink Summary Report / Loop Recorder 

## 2016-01-14 NOTE — Progress Notes (Signed)
I have reviewed Dr. Gladstone Pih note.  Patient is on [facility] for PE, long term use.  INR at goal.

## 2016-01-14 NOTE — Progress Notes (Signed)
Internal Medicine Clinic Attending  Case discussed with Dr. Rice at the time of the visit.  We reviewed the resident's history and exam and pertinent patient test results.  I agree with the assessment, diagnosis, and plan of care documented in the resident's note.  

## 2016-01-19 ENCOUNTER — Ambulatory Visit: Payer: Commercial Managed Care - HMO

## 2016-01-19 NOTE — Addendum Note (Signed)
Addended by: Truddie Crumble on: 01/19/2016 09:27 AM   Modules accepted: Orders

## 2016-01-21 ENCOUNTER — Emergency Department (HOSPITAL_COMMUNITY): Payer: Commercial Managed Care - HMO

## 2016-01-21 ENCOUNTER — Encounter (HOSPITAL_COMMUNITY): Payer: Self-pay | Admitting: Emergency Medicine

## 2016-01-21 ENCOUNTER — Emergency Department (HOSPITAL_COMMUNITY)
Admission: EM | Admit: 2016-01-21 | Discharge: 2016-01-21 | Disposition: A | Discharge status: Home / Self Care | Payer: Commercial Managed Care - HMO | Attending: Emergency Medicine | Admitting: Emergency Medicine

## 2016-01-21 DIAGNOSIS — Z9889 Other specified postprocedural states: Secondary | ICD-10-CM | POA: Diagnosis not present

## 2016-01-21 DIAGNOSIS — F419 Anxiety disorder, unspecified: Secondary | ICD-10-CM | POA: Insufficient documentation

## 2016-01-21 DIAGNOSIS — Z8546 Personal history of malignant neoplasm of prostate: Secondary | ICD-10-CM | POA: Diagnosis not present

## 2016-01-21 DIAGNOSIS — Y998 Other external cause status: Secondary | ICD-10-CM | POA: Insufficient documentation

## 2016-01-21 DIAGNOSIS — M542 Cervicalgia: Secondary | ICD-10-CM | POA: Diagnosis not present

## 2016-01-21 DIAGNOSIS — Z043 Encounter for examination and observation following other accident: Secondary | ICD-10-CM | POA: Diagnosis not present

## 2016-01-21 DIAGNOSIS — Z7982 Long term (current) use of aspirin: Secondary | ICD-10-CM | POA: Diagnosis not present

## 2016-01-21 DIAGNOSIS — Z79899 Other long term (current) drug therapy: Secondary | ICD-10-CM | POA: Diagnosis not present

## 2016-01-21 DIAGNOSIS — I2510 Atherosclerotic heart disease of native coronary artery without angina pectoris: Secondary | ICD-10-CM | POA: Insufficient documentation

## 2016-01-21 DIAGNOSIS — Y9289 Other specified places as the place of occurrence of the external cause: Secondary | ICD-10-CM | POA: Diagnosis not present

## 2016-01-21 DIAGNOSIS — I10 Essential (primary) hypertension: Secondary | ICD-10-CM | POA: Diagnosis not present

## 2016-01-21 DIAGNOSIS — M419 Scoliosis, unspecified: Secondary | ICD-10-CM | POA: Insufficient documentation

## 2016-01-21 DIAGNOSIS — W19XXXA Unspecified fall, initial encounter: Secondary | ICD-10-CM

## 2016-01-21 DIAGNOSIS — E785 Hyperlipidemia, unspecified: Secondary | ICD-10-CM | POA: Insufficient documentation

## 2016-01-21 DIAGNOSIS — S199XXA Unspecified injury of neck, initial encounter: Secondary | ICD-10-CM | POA: Diagnosis not present

## 2016-01-21 DIAGNOSIS — K219 Gastro-esophageal reflux disease without esophagitis: Secondary | ICD-10-CM | POA: Diagnosis not present

## 2016-01-21 DIAGNOSIS — F0390 Unspecified dementia without behavioral disturbance: Secondary | ICD-10-CM | POA: Insufficient documentation

## 2016-01-21 DIAGNOSIS — Z7901 Long term (current) use of anticoagulants: Secondary | ICD-10-CM | POA: Insufficient documentation

## 2016-01-21 DIAGNOSIS — Y9389 Activity, other specified: Secondary | ICD-10-CM | POA: Diagnosis not present

## 2016-01-21 DIAGNOSIS — W010XXA Fall on same level from slipping, tripping and stumbling without subsequent striking against object, initial encounter: Secondary | ICD-10-CM | POA: Insufficient documentation

## 2016-01-21 DIAGNOSIS — F329 Major depressive disorder, single episode, unspecified: Secondary | ICD-10-CM | POA: Insufficient documentation

## 2016-01-21 DIAGNOSIS — S0990XA Unspecified injury of head, initial encounter: Secondary | ICD-10-CM | POA: Diagnosis not present

## 2016-01-21 DIAGNOSIS — Z86711 Personal history of pulmonary embolism: Secondary | ICD-10-CM | POA: Diagnosis not present

## 2016-01-21 DIAGNOSIS — Z86018 Personal history of other benign neoplasm: Secondary | ICD-10-CM | POA: Diagnosis not present

## 2016-01-21 LAB — PROTIME-INR
INR: 2.26 — AB (ref 0.00–1.49)
Prothrombin Time: 24.8 seconds — ABNORMAL HIGH (ref 11.6–15.2)

## 2016-01-21 NOTE — ED Notes (Signed)
Pt here with family sts caregivers said he tripped and fell today; pt on coumadin; pt denies major pain; pt alert to person only which is baseline

## 2016-01-21 NOTE — Discharge Instructions (Signed)
Follow-up as needed. The risk of falls and his Coumadin needs to balance against the risk of the pulmonary embolism.

## 2016-01-21 NOTE — ED Provider Notes (Signed)
CSN: [id number]     Arrival date & time 01/21/16  1228 History   First MD Initiated Contact with Patient 01/21/16 1428     Chief Complaint  Patient presents with  . Fall     Level 5 caveat due to altered mental status/dementia. HPI Patient with apparent fall at home. Patient has baseline dementia and cannot really participate in the history. Patient's son is in the room with him and states this is his baseline. States the falls are unusual and because of it does not really need to be chased. He is on Coumadin for previous pulmonary embolisms. At recurrent PE and is reportedly going to be on lifelong Coumadin.   Past Medical History  Diagnosis Date  . PE (pulmonary embolism)     unprovoked PE completed 6 months of warfarin, warfarin d/ced 02/12/2010, repeat PE 07/10/2010 after a long car ride and now on lifelong coumadin.  [name] Hypertension   . Pituitary microadenoma ([place])     incidental finding CT 12/2009  . Prostate cancer ([place])   . Depression   . Hyperlipidemia   . Coronary artery disease, non-occlusive     with history of MI; Cath 2008 w multivessel nonobstructive CAD  . Dementia   . GERD (gastroesophageal reflux disease)   . Scoliosis   . Anxiety    Past Surgical History  Procedure Laterality Date  . Prostatectomy    . Left heart catheterization with coronary angiogram N/A 12/10/2014    Procedure: LEFT HEART CATHETERIZATION WITH CORONARY ANGIOGRAM;  Surgeon: [name], MD;  Location: [facility] CATH LAB;  Service: Cardiovascular;  Laterality: N/A;  . Loop recorder implant N/A 12/16/2014    Procedure: LOOP RECORDER IMPLANT;  Surgeon: [name], MD;  Location: [facility] CATH LAB;  Service: Cardiovascular;  Laterality: N/A;   Family History  Problem Relation Age of Onset  . Hypertension Father     Passed away from cerebral hemorrhage at age of 17.  [name] Dementia Mother     Passed away  . Hypertension Child     4 adult children  . Cervical cancer Other   . Lung cancer Other   .  Stroke Other   . Heart attack Neg Hx    Social History  Substance Use Topics  . Smoking status: Never Smoker   . Smokeless tobacco: Never Used  . Alcohol Use: No    Review of Systems  Unable to perform ROS: Dementia      Allergies  Aricept; Other; and Shellfish-derived products  Home Medications   Prior to Admission medications   Medication Sig Start Date End Date Taking? Authorizing Provider  acetaminophen (TYLENOL) 500 MG tablet Take 500 mg by mouth every 4 (four) hours as needed for mild pain or headache.   Yes Historical Provider, MD  amLODipine (NORVASC) 2.5 MG tablet TAKE 1 TABLET (2.5 MG TOTAL) BY MOUTH DAILY. 07/08/15  Yes [name], MD  aspirin EC 81 MG EC tablet Take 1 tablet (81 mg total) by mouth daily. 12/17/14  Yes Isaiah Serge, NP  atorvastatin (LIPITOR) 20 MG tablet Take 1 tablet (20 mg total) by mouth daily. 10/20/15  Yes Dorothy Spark, MD  b complex vitamins tablet Take 1 tablet by mouth daily.   Yes Historical Provider, MD  Calcium Carbonate-Vitamin D (CALCIUM-VITAMIN D) 500-200 MG-UNIT per tablet Take 1 tablet by mouth daily.   Yes Historical Provider, MD  carbidopa-levodopa (SINEMET IR) 25-100 MG tablet Take 1 tablet by mouth 2 (two)  times daily. (breakfast and early afternoon) 10/03/15  Yes Garvin Fila, MD  fish oil-omega-3 fatty acids 1000 MG capsule Take 2 g by mouth daily.    Yes Historical Provider, MD  furosemide (LASIX) 20 MG tablet Take 1 tablet (20 mg total) by mouth daily. 04/14/15  Yes Liliane Shi, PA-C  Multiple Vitamins-Minerals (MULTIVITAMIN WITH MINERALS) tablet Take 1 tablet by mouth daily.   Yes Historical Provider, MD  NAMENDA XR 28 MG CP24 24 hr capsule TAKE ONE CAPSULE BY MOUTH EVERY DAY 12/18/15  Yes Garvin Fila, MD  omeprazole (PRILOSEC) 20 MG capsule TAKE 1 CAPSULE (20 MG TOTAL) BY MOUTH DAILY. 11/11/15  Yes Francesca Oman, DO  potassium chloride (K-DUR) 10 MEQ tablet Take 1 tablet (10 mEq total) by mouth as directed. 1st  dose take 2 tabs = 20 meq then decrease to 1 tab = 10 meq daily Patient taking differently: Take 10 mEq by mouth daily.  07/10/15  Yes [name], MD  QUEtiapine (SEROQUEL) 25 MG tablet Take 1 tablet (25 mg total) by mouth at bedtime. 12/30/15  Yes Garvin Fila, MD  vitamin C (ASCORBIC ACID) 500 MG tablet Take 500 mg by mouth daily.   Yes Historical Provider, MD  warfarin (COUMADIN) 5 MG tablet Take 2.5-7.5 mg by mouth daily. Current dosing as of 01/21/16:  2.5mg  Monday, 7.5mg  Wednesday, 5mg  all other days   Yes Historical Provider, MD  warfarin (COUMADIN) 5 MG tablet TAKE AS DIRECTED. 12/04/15   Francesca Oman, DO   BP 139/98 mmHg  Pulse 63  Temp(Src) 98 F (36.7 C) (Oral)  Resp 18  SpO2 96% Physical Exam  Constitutional: He appears well-developed.  HENT:  Head: Atraumatic.  Eyes: Pupils are equal, round, and reactive to light.  Cardiovascular: Normal rate.   Pulmonary/Chest: Effort normal.  Abdominal: Soft.  Musculoskeletal:  Moves all extremities. No tenderness over head and neck back chest abdomen or extremities. Good range of motion in hips and knees.  Neurological: He is alert.  Awake and pleasant, but demented.  Skin: Skin is warm.    ED Course  Procedures (including critical care time) Labs Review Labs Reviewed  PROTIME-INR - Abnormal; Notable for the following:    Prothrombin Time 24.8 (*)    INR 2.26 (*)    All other components within normal limits    Imaging Review Ct Head Wo Contrast  01/21/2016  CLINICAL DATA:  Status post fall. No headache. Stiff neck. Patient reports chronic anticoagulation history. EXAM: CT HEAD WITHOUT CONTRAST CT CERVICAL SPINE WITHOUT CONTRAST TECHNIQUE: Multidetector CT imaging of the head and cervical spine was performed following the standard protocol without intravenous contrast. Multiplanar CT image reconstructions of the cervical spine were also generated. COMPARISON:  07/27/2015. FINDINGS: CT HEAD FINDINGS No evidence for acute  infarction, hemorrhage, mass lesion, hydrocephalus, or extra-axial fluid. Global atrophy with chronic microvascular ischemic change. No skull fracture. Sinuses and mastoids show no air-fluid level. Moderate vascular calcification in the skull base carotid segments. Similar appearance to priors. CT CERVICAL SPINE FINDINGS There is no visible cervical spine fracture, traumatic subluxation, prevertebral soft tissue swelling, or intraspinal hematoma. Spontaneous arthrodesis due to degenerative disc disease C3-C4. Severe disc space narrowing C6-C7. Facet mediated anterolisthesis C5-6 of 3 mm, contributing to stenosis at this level and C6-7. Lung apices clear.  No neck masses. IMPRESSION: Global atrophy with chronic microvascular ischemic change. No skull fracture or intracranial hemorrhage. No cervical spine fracture or traumatic subluxation. Electronically Signed  By: Staci Righter M.D.   On: 01/21/2016 14:05   Ct Cervical Spine Wo Contrast  01/21/2016  CLINICAL DATA:  Status post fall. No headache. Stiff neck. Patient reports chronic anticoagulation history. EXAM: CT HEAD WITHOUT CONTRAST CT CERVICAL SPINE WITHOUT CONTRAST TECHNIQUE: Multidetector CT imaging of the head and cervical spine was performed following the standard protocol without intravenous contrast. Multiplanar CT image reconstructions of the cervical spine were also generated. COMPARISON:  07/27/2015. FINDINGS: CT HEAD FINDINGS No evidence for acute infarction, hemorrhage, mass lesion, hydrocephalus, or extra-axial fluid. Global atrophy with chronic microvascular ischemic change. No skull fracture. Sinuses and mastoids show no air-fluid level. Moderate vascular calcification in the skull base carotid segments. Similar appearance to priors. CT CERVICAL SPINE FINDINGS There is no visible cervical spine fracture, traumatic subluxation, prevertebral soft tissue swelling, or intraspinal hematoma. Spontaneous arthrodesis due to degenerative disc disease  C3-C4. Severe disc space narrowing C6-C7. Facet mediated anterolisthesis C5-6 of 3 mm, contributing to stenosis at this level and C6-7. Lung apices clear.  No neck masses. IMPRESSION: Global atrophy with chronic microvascular ischemic change. No skull fracture or intracranial hemorrhage. No cervical spine fracture or traumatic subluxation. Electronically Signed   By: Staci Righter M.D.   On: 01/21/2016 14:05   I have personally reviewed and evaluated these images and lab results as part of my medical decision-making.   EKG Interpretation None      MDM   Final diagnoses:  Fall, initial encounter    Patient with fall. On Coumadin. Negative initial head CT. Negative cervical spine for fracture. Patient at baseline and no clear tenderness. Will discharge home.    Davonna Belling, MD 01/21/16 [phone]

## 2016-01-29 ENCOUNTER — Ambulatory Visit (INDEPENDENT_AMBULATORY_CARE_PROVIDER_SITE_OTHER): Payer: Commercial Managed Care - HMO | Admitting: Podiatry

## 2016-01-29 ENCOUNTER — Encounter: Payer: Self-pay | Admitting: Podiatry

## 2016-01-29 DIAGNOSIS — B351 Tinea unguium: Secondary | ICD-10-CM

## 2016-01-29 DIAGNOSIS — M79676 Pain in unspecified toe(s): Secondary | ICD-10-CM

## 2016-01-29 NOTE — Progress Notes (Signed)
Patient ID: [name] [name], male   DOB: Jul 28, 1931, 82 y.o.   MRN: [id number] Complaint:  Visit Type: Patient returns to my office for continued preventative foot care services. Complaint: Patient states" my nails have grown long and thick and become painful to walk and wear shoes" . The patient presents for preventative foot care services. No changes to ROS  Podiatric Exam: Vascular: dorsalis pedis and posterior tibial pulses are palpable bilateral. Capillary return is immediate. Temperature gradient is WNL. Skin turgor WNL  Sensorium: Normal Semmes Weinstein monofilament test. Normal tactile sensation bilaterally. Nail Exam: Pt has thick disfigured discolored nails with subungual debris noted bilateral entire nail hallux through fifth toenails Ulcer Exam: There is no evidence of ulcer or pre-ulcerative changes or infection. Orthopedic Exam: Muscle tone and strength are WNL. No limitations in general ROM. No crepitus or effusions noted. Foot type and digits show no abnormalities. Bony prominences are unremarkable. Skin: No Porokeratosis. No infection or ulcers  Diagnosis:  Onychomycosis, , Pain in right toe, pain in left toes  Treatment & Plan Procedures and Treatment: Consent by patient was obtained for treatment procedures. The patient understood the discussion of treatment and procedures well. All questions were answered thoroughly reviewed. Debridement of mycotic and hypertrophic toenails, 1 through 5 bilateral and clearing of subungual debris. No ulceration, no infection noted.  Return Visit-Office Procedure: Patient instructed to return to the office for a follow up visit 10 weeks  for continued evaluation and treatment.    Gardiner Barefoot DPM

## 2016-02-03 ENCOUNTER — Other Ambulatory Visit: Payer: Self-pay | Admitting: Internal Medicine

## 2016-02-04 ENCOUNTER — Encounter: Payer: Self-pay | Admitting: Internal Medicine

## 2016-02-05 ENCOUNTER — Ambulatory Visit: Payer: Commercial Managed Care - HMO | Admitting: Podiatry

## 2016-02-09 ENCOUNTER — Ambulatory Visit (INDEPENDENT_AMBULATORY_CARE_PROVIDER_SITE_OTHER): Payer: Commercial Managed Care - HMO | Admitting: *Deleted

## 2016-02-09 ENCOUNTER — Ambulatory Visit (INDEPENDENT_AMBULATORY_CARE_PROVIDER_SITE_OTHER): Payer: Commercial Managed Care - HMO | Admitting: Pharmacist

## 2016-02-09 DIAGNOSIS — Z7901 Long term (current) use of anticoagulants: Secondary | ICD-10-CM | POA: Diagnosis not present

## 2016-02-09 DIAGNOSIS — R55 Syncope and collapse: Secondary | ICD-10-CM | POA: Diagnosis not present

## 2016-02-09 DIAGNOSIS — I2782 Chronic pulmonary embolism: Secondary | ICD-10-CM

## 2016-02-09 DIAGNOSIS — Z7902 Long term (current) use of antithrombotics/antiplatelets: Secondary | ICD-10-CM

## 2016-02-09 LAB — POCT INR: INR: 2.7

## 2016-02-09 NOTE — Patient Instructions (Signed)
Patient instructed to take medications as defined in the Anti-coagulation Track section of this encounter.  Patient instructed to take today's dose.  Patient verbalized understanding of these instructions.    

## 2016-02-09 NOTE — Progress Notes (Signed)
Anti-Coagulation Progress Note  Kastiel Gadbois is a 82 y.o. male who is currently on an anti-coagulation regimen.    RECENT RESULTS: Recent results are below, the most recent result is correlated with a dose of 32.5 mg. per week: Lab Results  Component Value Date   INR 2.70 02/09/2016   INR 2.26* 01/21/2016   INR 2.50 12/29/2015    ANTI-COAG DOSE: Anticoagulation Dose Instructions as of 02/09/2016      [name] Tue Wed Thu Fri Sat   New Dose 5 mg 2.5 mg 5 mg 5 mg 2.5 mg 5 mg 5 mg       ANTICOAG SUMMARY: Anticoagulation Episode Summary    Current INR goal 2.0-3.0  Next INR check 03/01/2016  INR from last check 2.70 (02/09/2016)  Weekly max dose   Target end date Indefinite  [facility]  Preferred lab   Send INR reminders to ANTICOAG IMP   Indications  Pulmonary embolism ([place]) [I26.99] Encounter for long-term use of antiplatelets/antithrombotics [Z79.02]        Comments History of multiple venous embolic episodes. Will continue to annual re-evaluate continued need for warfarin weighing risks vs. benefits.       Anticoagulation Care Providers    Provider Role Specialty Phone number   Bertha Stakes, MD  Internal Medicine [phone]      ANTICOAG TODAY: Anticoagulation Summary as of 02/09/2016    INR goal 2.0-3.0  Selected INR 2.70 (02/09/2016)  Next INR check 03/01/2016  Target end date Indefinite   Indications  Pulmonary embolism ([place]) [I26.99] Encounter for long-term use of antiplatelets/antithrombotics [Z79.02]      Anticoagulation Episode Summary    [facility]   Preferred lab    Send INR reminders to ANTICOAG IMP   Comments History of multiple venous embolic episodes. Will continue to annual re-evaluate continued need for warfarin weighing risks vs. benefits.     Anticoagulation Care Providers    Provider Role Specialty Phone number   Bertha Stakes, MD  Internal Medicine [phone]      PATIENT  INSTRUCTIONS: Patient Instructions  Patient instructed to take medications as defined in the Anti-coagulation Track section of this encounter.  Patient instructed to take today's dose.  Patient verbalized understanding of these instructions.       FOLLOW-UP Return in 3 weeks (on 03/01/2016) for Follow up INR at 1130h.  Jorene Guest, [name] Pharm.D., CACP

## 2016-02-10 NOTE — Progress Notes (Signed)
Carelink Summary Report / Loop Recorder 

## 2016-02-11 ENCOUNTER — Emergency Department (HOSPITAL_COMMUNITY)
Admission: EM | Admit: 2016-02-11 | Discharge: 2016-02-11 | Disposition: A | Discharge status: Home / Self Care | Payer: Commercial Managed Care - HMO | Attending: Emergency Medicine | Admitting: Emergency Medicine

## 2016-02-11 ENCOUNTER — Other Ambulatory Visit: Payer: Self-pay

## 2016-02-11 ENCOUNTER — Emergency Department (HOSPITAL_COMMUNITY): Payer: Commercial Managed Care - HMO

## 2016-02-11 ENCOUNTER — Encounter (HOSPITAL_COMMUNITY): Payer: Self-pay | Admitting: Emergency Medicine

## 2016-02-11 DIAGNOSIS — R482 Apraxia: Secondary | ICD-10-CM | POA: Diagnosis not present

## 2016-02-11 DIAGNOSIS — E785 Hyperlipidemia, unspecified: Secondary | ICD-10-CM | POA: Insufficient documentation

## 2016-02-11 DIAGNOSIS — W1839XA Other fall on same level, initial encounter: Secondary | ICD-10-CM | POA: Diagnosis not present

## 2016-02-11 DIAGNOSIS — R1310 Dysphagia, unspecified: Secondary | ICD-10-CM | POA: Diagnosis not present

## 2016-02-11 DIAGNOSIS — Z9889 Other specified postprocedural states: Secondary | ICD-10-CM | POA: Diagnosis not present

## 2016-02-11 DIAGNOSIS — F0390 Unspecified dementia without behavioral disturbance: Secondary | ICD-10-CM | POA: Diagnosis not present

## 2016-02-11 DIAGNOSIS — I2510 Atherosclerotic heart disease of native coronary artery without angina pectoris: Secondary | ICD-10-CM | POA: Diagnosis not present

## 2016-02-11 DIAGNOSIS — Z86711 Personal history of pulmonary embolism: Secondary | ICD-10-CM | POA: Insufficient documentation

## 2016-02-11 DIAGNOSIS — S0081XA Abrasion of other part of head, initial encounter: Secondary | ICD-10-CM | POA: Insufficient documentation

## 2016-02-11 DIAGNOSIS — Z7901 Long term (current) use of anticoagulants: Secondary | ICD-10-CM | POA: Diagnosis not present

## 2016-02-11 DIAGNOSIS — Y9389 Activity, other specified: Secondary | ICD-10-CM | POA: Diagnosis not present

## 2016-02-11 DIAGNOSIS — R4781 Slurred speech: Secondary | ICD-10-CM | POA: Diagnosis not present

## 2016-02-11 DIAGNOSIS — F419 Anxiety disorder, unspecified: Secondary | ICD-10-CM | POA: Insufficient documentation

## 2016-02-11 DIAGNOSIS — R402411 Glasgow coma scale score 13-15, in the field [EMT or ambulance]: Secondary | ICD-10-CM | POA: Diagnosis not present

## 2016-02-11 DIAGNOSIS — Z7982 Long term (current) use of aspirin: Secondary | ICD-10-CM | POA: Insufficient documentation

## 2016-02-11 DIAGNOSIS — Z79899 Other long term (current) drug therapy: Secondary | ICD-10-CM | POA: Diagnosis not present

## 2016-02-11 DIAGNOSIS — Y9289 Other specified places as the place of occurrence of the external cause: Secondary | ICD-10-CM | POA: Diagnosis not present

## 2016-02-11 DIAGNOSIS — G20 Parkinson's disease: Secondary | ICD-10-CM | POA: Insufficient documentation

## 2016-02-11 DIAGNOSIS — Z8546 Personal history of malignant neoplasm of prostate: Secondary | ICD-10-CM | POA: Insufficient documentation

## 2016-02-11 DIAGNOSIS — M419 Scoliosis, unspecified: Secondary | ICD-10-CM | POA: Insufficient documentation

## 2016-02-11 DIAGNOSIS — I6789 Other cerebrovascular disease: Secondary | ICD-10-CM | POA: Diagnosis not present

## 2016-02-11 DIAGNOSIS — I4510 Unspecified right bundle-branch block: Secondary | ICD-10-CM | POA: Insufficient documentation

## 2016-02-11 DIAGNOSIS — I10 Essential (primary) hypertension: Secondary | ICD-10-CM | POA: Insufficient documentation

## 2016-02-11 DIAGNOSIS — Y998 Other external cause status: Secondary | ICD-10-CM | POA: Diagnosis not present

## 2016-02-11 DIAGNOSIS — I252 Old myocardial infarction: Secondary | ICD-10-CM | POA: Insufficient documentation

## 2016-02-11 DIAGNOSIS — R4182 Altered mental status, unspecified: Secondary | ICD-10-CM | POA: Diagnosis present

## 2016-02-11 DIAGNOSIS — R29810 Facial weakness: Secondary | ICD-10-CM | POA: Diagnosis not present

## 2016-02-11 DIAGNOSIS — K219 Gastro-esophageal reflux disease without esophagitis: Secondary | ICD-10-CM | POA: Insufficient documentation

## 2016-02-11 DIAGNOSIS — F329 Major depressive disorder, single episode, unspecified: Secondary | ICD-10-CM | POA: Insufficient documentation

## 2016-02-11 DIAGNOSIS — Z86018 Personal history of other benign neoplasm: Secondary | ICD-10-CM | POA: Insufficient documentation

## 2016-02-11 LAB — DIFFERENTIAL
BASOS ABS: 0 10*3/uL (ref 0.0–0.1)
Basophils Relative: 1 %
Eosinophils Absolute: 0.2 10*3/uL (ref 0.0–0.7)
Eosinophils Relative: 4 %
LYMPHS PCT: 37 %
Lymphs Abs: 1.5 10*3/uL (ref 0.7–4.0)
Monocytes Absolute: 0.4 10*3/uL (ref 0.1–1.0)
Monocytes Relative: 10 %
NEUTROS ABS: 1.9 10*3/uL (ref 1.7–7.7)
NEUTROS PCT: 48 %

## 2016-02-11 LAB — CBG MONITORING, ED: GLUCOSE-CAPILLARY: 91 mg/dL (ref 65–99)

## 2016-02-11 LAB — I-STAT CHEM 8, ED
BUN: 17 mg/dL (ref 6–20)
CALCIUM ION: 1.13 mmol/L (ref 1.13–1.30)
CHLORIDE: 106 mmol/L (ref 101–111)
CREATININE: 1.1 mg/dL (ref 0.61–1.24)
Glucose, Bld: 82 mg/dL (ref 65–99)
HCT: 45 % (ref 39.0–52.0)
Hemoglobin: 15.3 g/dL (ref 13.0–17.0)
Potassium: 4.2 mmol/L (ref 3.5–5.1)
SODIUM: 144 mmol/L (ref 135–145)
TCO2: 27 mmol/L (ref 0–100)

## 2016-02-11 LAB — APTT: APTT: 33 s (ref 24–37)

## 2016-02-11 LAB — COMPREHENSIVE METABOLIC PANEL
ALBUMIN: 3.2 g/dL — AB (ref 3.5–5.0)
ALK PHOS: 100 U/L (ref 38–126)
ALT: 75 U/L — AB (ref 17–63)
AST: 127 U/L — ABNORMAL HIGH (ref 15–41)
Anion gap: 8 (ref 5–15)
BUN: 13 mg/dL (ref 6–20)
CO2: 23 mmol/L (ref 22–32)
CREATININE: 1.14 mg/dL (ref 0.61–1.24)
Calcium: 9.3 mg/dL (ref 8.9–10.3)
Chloride: 109 mmol/L (ref 101–111)
GFR calc Af Amer: >60 mL/min (ref >60)
GFR calc non Af Amer: 58 mL/min — ABNORMAL LOW (ref >60)
Glucose, Bld: 82 mg/dL (ref 65–99)
Potassium: 4.2 mmol/L (ref 3.5–5.1)
SODIUM: 140 mmol/L (ref 135–145)
Total Bilirubin: 0.8 mg/dL (ref 0.3–1.2)
Total Protein: 7.2 g/dL (ref 6.5–8.1)

## 2016-02-11 LAB — CBC
HCT: 40.7 % (ref 39.0–52.0)
Hemoglobin: 12.8 g/dL — ABNORMAL LOW (ref 13.0–17.0)
MCH: 28.4 pg (ref 26.0–34.0)
MCHC: 31.4 g/dL (ref 30.0–36.0)
MCV: 90.4 fL (ref 78.0–100.0)
PLATELETS: 182 10*3/uL (ref 150–400)
RBC: 4.5 MIL/uL (ref 4.22–5.81)
RDW: 16.6 % — ABNORMAL HIGH (ref 11.5–15.5)
WBC: 3.9 10*3/uL — AB (ref 4.0–10.5)

## 2016-02-11 LAB — I-STAT TROPONIN, ED: TROPONIN I, POC: 0.02 ng/mL (ref 0.00–0.08)

## 2016-02-11 LAB — PROTIME-INR
INR: 1.95 — AB (ref 0.00–1.49)
PROTHROMBIN TIME: 22.1 s — AB (ref 11.6–15.2)

## 2016-02-11 MED ORDER — CARBIDOPA-LEVODOPA 25-100 MG PO TABS
1.0000 | ORAL_TABLET | Freq: Once | ORAL | Status: AC
  Administered 2016-02-11: 1 via ORAL
  Filled 2016-02-11 (×2): qty 1

## 2016-02-11 MED ORDER — CARBIDOPA-LEVODOPA 25-100 MG PO TABS: 1.0000 | ORAL_TABLET | Freq: Three times a day (TID) | ORAL | Status: DC

## 2016-02-11 NOTE — Consult Note (Addendum)
Requesting Physician: Dr. Johnney Killian   Chief Complaint: Code stroke   HPI:   [name] [name] is an 82 y.o. male arriving to [facility] via [place] at 1109. Patient from home where his daughter noticed him to have difficulty chewing at breakfast. HE was actually last seen normal at 6 PM the night before. Per daughter he has a baseline right facial droop and other than difficulty swallowing there was not abnormal. She was concerned due to the fact he had a fall yesterday and wanted to ensure he did not have a bleed. She notes patient did not take his Parkinson's medication this morning. He has had a PE and cuon Coumadin. Of note, patient did sustain a fall yesterday with abrasion to right face. Patient slow to respond with right facial droop at baseline per patient's daughter. Code stroke called on arrival to the ED. Stroke team to the bedside. Patient transported to CT with team. NIHSS 3, see documentation for details and code stroke times. Patient unable to answer questions correctly with mild right facial droop on exam. Patient is slow to respond, but his baseline has significant bradykinesia and slow to respond.  INR 1.95 on arrival.  Date last known well: 5.23.2017 Time last known well: Time: 18:00 tPA Given: No: out of window and INR >1.7    I  Past Medical History  Diagnosis Date  . PE (pulmonary embolism)     unprovoked PE completed 6 months of warfarin, warfarin d/ced 02/12/2010, repeat PE 07/10/2010 after a long car ride and now on lifelong coumadin.  [name] Hypertension   . Pituitary microadenoma ([place])     incidental finding CT 12/2009  . Prostate cancer ([place])   . Depression   . Hyperlipidemia   . Coronary artery disease, non-occlusive     with history of MI; Cath 2008 w multivessel nonobstructive CAD  . Dementia   . GERD (gastroesophageal reflux  disease)   . Scoliosis   . Anxiety     Past Surgical History  Procedure Laterality Date  . Prostatectomy    . Left heart catheterization with coronary angiogram N/A 12/10/2014    Procedure: LEFT HEART CATHETERIZATION WITH CORONARY ANGIOGRAM;  Surgeon: [name], MD;  Location: [facility] CATH LAB;  Service: Cardiovascular;  Laterality: N/A;  . Loop recorder implant N/A 12/16/2014    Procedure: LOOP RECORDER IMPLANT;  Surgeon: [name], MD;  Location: [facility] CATH LAB;  Service: Cardiovascular;  Laterality: N/A;    Family History  Problem Relation Age of Onset  . Hypertension Father     Passed away from cerebral hemorrhage at age of 67.  [name] Dementia Mother     Passed away  . Hypertension Child     4 adult children  . Cervical cancer Other   . Lung cancer Other   . Stroke Other   . Heart attack Neg Hx    Social History:  reports that he has never smoked. He has never used smokeless tobacco. He reports that he does not drink alcohol or use illicit drugs.  Allergies:  Allergies  Allergen Reactions  . Aricept [Donepezil Hcl] Other (See Comments)    Symptomatic Bradycardia.  . Other Other (See Comments)    Shrimp gives him gout  . Shellfish-Derived Products Other (See Comments)    Causes a flare up with Gout    Medications:  No current facility-administered medications for this encounter.   Current Outpatient Prescriptions  Medication Sig Dispense Refill  . acetaminophen (TYLENOL) 500 MG tablet Take 500 mg by mouth every 4 (four) hours as needed for mild pain or headache.    [name] amLODipine (NORVASC) 2.5 MG tablet TAKE 1 TABLET (2.5 MG TOTAL) BY MOUTH DAILY. 90 tablet 3  . aspirin EC 81 MG EC tablet Take 1 tablet (81 mg total) by mouth daily.    [name] atorvastatin (LIPITOR) 20 MG tablet Take 1 tablet (20 mg total) by mouth daily. 90 tablet 1  . b complex vitamins  tablet Take 1 tablet by mouth daily.    . Calcium Carbonate-Vitamin D (CALCIUM-VITAMIN D) 500-200 MG-UNIT per tablet Take 1 tablet by mouth daily.    . carbidopa-levodopa (SINEMET IR) 25-100 MG tablet Take 1 tablet by mouth 2 (two) times daily. (breakfast and early afternoon) 180 tablet 1  . fish oil-omega-3 fatty acids 1000 MG capsule Take 2 g by mouth daily.     . furosemide (LASIX) 20 MG tablet Take 1 tablet (20 mg total) by mouth daily.    . Multiple Vitamins-Minerals (MULTIVITAMIN WITH MINERALS) tablet Take 1 tablet by mouth daily.    [name] NAMENDA XR 28 MG CP24 24 hr capsule TAKE ONE CAPSULE BY MOUTH EVERY DAY 30 capsule 6  . omeprazole (PRILOSEC) 20 MG capsule TAKE 1 CAPSULE (20 MG TOTAL) BY MOUTH DAILY. 30 capsule 0  . potassium chloride (K-DUR) 10 MEQ tablet Take 1 tablet (10 mEq total) by mouth as directed. 1st dose take 2 tabs = 20 meq then decrease to 1 tab = 10 meq daily (Patient taking differently: Take 10 mEq by mouth daily. ) 90 tablet 3  . QUEtiapine (SEROQUEL) 25 MG tablet Take 1 tablet (25 mg total) by mouth at bedtime. 90 tablet 1  . vitamin C (ASCORBIC ACID) 500 MG tablet Take 500 mg by mouth daily.    [name] warfarin (COUMADIN) 5 MG tablet TAKE AS DIRECTED. 40 tablet 3  . warfarin (COUMADIN) 5 MG tablet Take 2.5-7.5 mg by mouth daily. Current dosing as of 01/21/16:  2.5mg  Monday, 7.5mg  Wednesday, 5mg  all other days       ROS:                                                                                                                                       History obtained from daughter  General ROS: negative for - chills, fatigue, fever, night sweats, weight gain or weight loss Psychological ROS: negative for - behavioral disorder, hallucinations, memory difficulties, mood swings or suicidal ideation Ophthalmic ROS: negative for - blurry vision, double vision, eye pain or loss of vision ENT ROS: negative for - epistaxis, nasal discharge, oral lesions, sore throat, tinnitus or  vertigo Allergy and Immunology ROS: negative for - hives or itchy/watery eyes Hematological and Lymphatic ROS: negative for - bleeding problems,  bruising or swollen lymph nodes Endocrine ROS: negative for - galactorrhea, hair pattern changes, polydipsia/polyuria or temperature intolerance Respiratory ROS: negative for - cough, hemoptysis, shortness of breath or wheezing Cardiovascular ROS: negative for - chest pain, dyspnea on exertion, edema or irregular heartbeat Gastrointestinal ROS: negative for - abdominal pain, diarrhea, hematemesis, nausea/vomiting or stool incontinence Genito-Urinary ROS: negative for - dysuria, hematuria, incontinence or urinary frequency/urgency Musculoskeletal ROS: negative for - joint swelling or muscular weakness Neurological ROS: as noted in HPI Dermatological ROS: negative for rash and skin lesion changes  Neurologic Examination:                                                                                                      Blood pressure 164/109, pulse 63, temperature 98.4 F (36.9 C), temperature source Oral, resp. rate 13, SpO2 97 %.  HEENT-  Normocephalic, no lesions, without obvious abnormality.  Normal external eye and conjunctiva.  Normal TM's bilaterally.  Normal auditory canals and external ears. Normal external nose, mucus membranes and septum.  Normal pharynx. Cardiovascular- S1, S2 normal, pulses palpable throughout   Lungs- chest clear, no wheezing, rales, normal symmetric air entry Abdomen- normal findings: bowel sounds normal Extremities- no edema Lymph-no adenopathy palpable Musculoskeletal-no joint tenderness, deformity or swelling Skin-warm and dry, no hyperpigmentation, vitiligo, or suspicious lesions  Neurological Examination Mental Status: Alert, slow to respond (baseline).  Speech fluent without evidence of aphasia.  Able to follow simple commands Cranial Nerves: II: blinks to threat bilaterally, pupils equal, round, reactive to  light and accommodation [name],IV, VI: ptosis not present, extra-ocular motions intact bilaterally V,VII: smile with very slight lag on right, facial light touch sensation normal bilaterally VIII: hearing normal bilaterally IX,X: uvula rises symmetrically XI: bilateral shoulder shrug XII: midline tongue extension Motor: 4/5 throughout, he has increased tone throughout, R > L  Sensory: Pinprick and light touch intact throughout, bilaterally Deep Tendon Reflexes: 1+ and symmetric throughout UE with 1+ KJ and no KJ Plantars: Right: downgoing   Left: downgoing Cerebellar: normal finger-to-nose--bradykinea Gait: not tested       Lab Results: Basic Metabolic Panel:  Recent Labs Lab 02/11/16 1143  NA 144  K 4.2  CL 106  GLUCOSE 82  BUN 17  CREATININE 1.10    Liver Function Tests: No results for input(s): AST, ALT, ALKPHOS, BILITOT, PROT, ALBUMIN in the last 168 hours. No results for input(s): LIPASE, AMYLASE in the last 168 hours. No results for input(s): AMMONIA in the last 168 hours.  CBC:  Recent Labs Lab 02/11/16 1135 02/11/16 1143  WBC 3.9*  --   NEUTROABS 1.9  --   HGB 12.8* 15.3  HCT 40.7 45.0  MCV 90.4  --   PLT 182  --     Cardiac Enzymes: No results for input(s): CKTOTAL, CKMB, CKMBINDEX, TROPONINI in the last 168 hours.  Lipid Panel: No results for input(s): CHOL, TRIG, HDL, CHOLHDL, VLDL, LDLCALC in the last 168 hours.  CBG:  Recent Labs Lab 02/11/16 1128  GLUCAP 91    Microbiology: Results for orders placed or performed during  the hospital encounter of 09/15/15  MRSA PCR Screening     Status: None   Collection Time: 09/15/15  9:55 AM  Result Value Ref Range Status   MRSA by PCR NEGATIVE NEGATIVE Final    Comment:        The GeneXpert MRSA Assay (FDA approved for NASAL specimens only), is one component of a comprehensive MRSA colonization surveillance program. It is not intended to diagnose MRSA infection nor to guide or monitor  treatment for MRSA infections.     Coagulation Studies:  Recent Labs  02/09/16 1137 02/11/16 1135  LABPROT  --  22.1*  INR 2.70 1.95*    Imaging: Ct Head Wo Contrast  02/11/2016  CLINICAL DATA:  Right facial droop, dysarthria EXAM: CT HEAD WITHOUT CONTRAST TECHNIQUE: Contiguous axial images were obtained from the base of the skull through the vertex without intravenous contrast. COMPARISON:  01/21/2016 FINDINGS: No skull fracture is noted. No intracranial hemorrhage, mass effect or midline shift. Stable atrophy and chronic white matter disease. No definite acute cortical infarction. No mass lesion is noted on this unenhanced scan. Ventricular size is stable from prior exam. Mild atherosclerotic calcifications of carotid siphon. Paranasal sinuses and mastoid air cells are unremarkable. IMPRESSION: No acute intracranial abnormality. Stable atrophy and chronic white matter disease. No definite acute cortical infarction. These results were called by telephone at the time of interpretation on 02/11/2016 at 11:55 am to Dr. Leonel Ramsay , who verbally acknowledged these results. Electronically Signed   By: Lahoma Crocker M.D.   On: 02/11/2016 11:56       Assessment and plan discussed with with attending physician and they are in agreement.    Etta Quill PA-C [facility] [phone]  02/11/2016, 12:12 PM   82 yo M with significant parkinsonism R > L. Family reports that he was very groggy on awakening, and he was "sleeping very soundly" when they awoke him.   Assessment: 82 y.o. male with difficulty swallowing. Patient at baseline has bradykinesia, right facial droop and slow mentation. I suspect that he was still very sleepy this morning, wither as a result of medication effect(seroquel) or from other causes(e.g. Not sleeping well). I have low suspicion for stroke given his symptoms and their resolution. His symptoms sound more like apraxia than dysphagia.   Stroke Risk Factors -  hyperlipidemia and hypertension  1) give dose of sinemet, if back to baseline per family, then no further testing needed. If still not at baseline, could do MRI brain to rule out stroke.  2) would increase sinemet to 25-100mg  TID 3) f/u with Dr. Leonie Man as an outpatient.   Roland Rack, MD [facility] [phone]  If 7pm- 7am, please page neurology on call as listed in [place].

## 2016-02-11 NOTE — ED Notes (Signed)
Pt arrives from home via [facility].  EMS reports Pt's family states pt unable to chew food as usual. Per EMS family reports pt hx Parkinson's, R mouth droop at baseline and pt leans to one side at baseline. Pt oriented to self, place, situation, not month or year.  R facial droop noted with slurred speech.  Lawerance Bach, PA at bedside.

## 2016-02-11 NOTE — Discharge Instructions (Signed)
Take the prescribed medication as directed--- note that medication has been increased from twice daily to 3x daily. Follow-up with Dr. Leonie Man-- call and inform him of today's ED visit and medication changes. Return to the ED for new or worsening symptoms--- new numbness, weakness, recurrent difficulty swallowing, slurred speech, etc.

## 2016-02-11 NOTE — ED Provider Notes (Signed)
CSN: [id number]     Arrival date & time 02/11/16  1109 History   First MD Initiated Contact with Patient 02/11/16 1111     Chief Complaint  Patient presents with  . Altered Mental Status     (Consider location/radiation/quality/duration/timing/severity/associated sxs/prior Treatment) The history is provided by the patient and medical records.    LEVEL 5 CAVEAT:  DEMENTIA 82 year old male with history of hypertension, prostate cancer, coronary artery disease, dementia, Parkinson's disease, PE on chronic Coumadin, presenting to the ED for altered mental status. Patient was last seen normal at 9:45 AM. Around 10:00am he was eating breakfast and was unable to chew his food or swallow as normal. He had an episode of slurred speech. EMS was called and patient transported here. On arrival patient is oriented to self, place, and current situation, he has some confusion about time. He does have a noted right facial droop with slurred speech.  Of note, patient also had a reported unwitnessed fall yesterday by EMS. He does have an abrasion below his right eye.  He does not recall events of this fall.  Past Medical History  Diagnosis Date  . PE (pulmonary embolism)     unprovoked PE completed 6 months of warfarin, warfarin d/ced 02/12/2010, repeat PE 07/10/2010 after a long car ride and now on lifelong coumadin.  [name] Hypertension   . Pituitary microadenoma ([place])     incidental finding CT 12/2009  . Prostate cancer ([facility])   . Depression   . Hyperlipidemia   . Coronary artery disease, non-occlusive     with history of MI; Cath 2008 w multivessel nonobstructive CAD  . Dementia   . GERD (gastroesophageal reflux disease)   . Scoliosis   . Anxiety    Past Surgical History  Procedure Laterality Date  . Prostatectomy    . Left heart catheterization with coronary angiogram N/A 12/10/2014    Procedure: LEFT HEART CATHETERIZATION WITH CORONARY ANGIOGRAM;  Surgeon: [name], MD;  Location: [facility] CATH LAB;   Service: Cardiovascular;  Laterality: N/A;  . Loop recorder implant N/A 12/16/2014    Procedure: LOOP RECORDER IMPLANT;  Surgeon: [name], MD;  Location: [facility] CATH LAB;  Service: Cardiovascular;  Laterality: N/A;   Family History  Problem Relation Age of Onset  . Hypertension Father     Passed away from cerebral hemorrhage at age of 34.  [name] Dementia Mother     Passed away  . Hypertension Child     4 adult children  . Cervical cancer Other   . Lung cancer Other   . Stroke Other   . Heart attack Neg Hx    Social History  Substance Use Topics  . Smoking status: Never Smoker   . Smokeless tobacco: Never Used  . Alcohol Use: No    Review of Systems  Unable to perform ROS: Dementia      Allergies  Aricept; Other; and Shellfish-derived products  Home Medications   Prior to Admission medications   Medication Sig Start Date End Date Taking? Authorizing Provider  acetaminophen (TYLENOL) 500 MG tablet Take 500 mg by mouth every 4 (four) hours as needed for mild pain or headache.    Historical Provider, MD  amLODipine (NORVASC) 2.5 MG tablet TAKE 1 TABLET (2.5 MG TOTAL) BY MOUTH DAILY. 07/08/15   [name], MD  aspirin EC 81 MG EC tablet Take 1 tablet (81 mg total) by mouth daily. 12/17/14   Isaiah Serge, NP  atorvastatin (LIPITOR) 20  MG tablet Take 1 tablet (20 mg total) by mouth daily. 10/20/15   Dorothy Spark, MD  b complex vitamins tablet Take 1 tablet by mouth daily.    Historical Provider, MD  Calcium Carbonate-Vitamin D (CALCIUM-VITAMIN D) 500-200 MG-UNIT per tablet Take 1 tablet by mouth daily.    Historical Provider, MD  carbidopa-levodopa (SINEMET IR) 25-100 MG tablet Take 1 tablet by mouth 2 (two) times daily. (breakfast and early afternoon) 10/03/15   Garvin Fila, MD  fish oil-omega-3 fatty acids 1000 MG capsule Take 2 g by mouth daily.     Historical Provider, MD  furosemide (LASIX) 20 MG tablet Take 1 tablet (20 mg total) by mouth daily. 04/14/15   Liliane Shi, PA-C  Multiple Vitamins-Minerals (MULTIVITAMIN WITH MINERALS) tablet Take 1 tablet by mouth daily.    Historical Provider, MD  NAMENDA XR 28 MG CP24 24 hr capsule TAKE ONE CAPSULE BY MOUTH EVERY DAY 12/18/15   Garvin Fila, MD  omeprazole (PRILOSEC) 20 MG capsule TAKE 1 CAPSULE (20 MG TOTAL) BY MOUTH DAILY. 02/05/16   Francesca Oman, DO  potassium chloride (K-DUR) 10 MEQ tablet Take 1 tablet (10 mEq total) by mouth as directed. 1st dose take 2 tabs = 20 meq then decrease to 1 tab = 10 meq daily Patient taking differently: Take 10 mEq by mouth daily.  07/10/15   [name], MD  QUEtiapine (SEROQUEL) 25 MG tablet Take 1 tablet (25 mg total) by mouth at bedtime. 12/30/15   Garvin Fila, MD  vitamin C (ASCORBIC ACID) 500 MG tablet Take 500 mg by mouth daily.    Historical Provider, MD  warfarin (COUMADIN) 5 MG tablet TAKE AS DIRECTED. 12/04/15   Francesca Oman, DO  warfarin (COUMADIN) 5 MG tablet Take 2.5-7.5 mg by mouth daily. Current dosing as of 01/21/16:  2.5mg  Monday, 7.5mg  Wednesday, 5mg  all other days    Historical Provider, MD   BP 164/109 mmHg  Pulse 51  Temp(Src) 98.4 F (36.9 C) (Oral)  Resp 15  SpO2 96%   Physical Exam  Constitutional: He appears well-developed and well-nourished. No distress.  HENT:  Head: Normocephalic and atraumatic.  Mouth/Throat: Oropharynx is clear and moist.  Eyes: Conjunctivae and EOM are normal. Pupils are equal, round, and reactive to light.  EOMs intact, no nystagmus Abrasion noted beneath right eye  Neck: Normal range of motion.  Cardiovascular: Normal rate, regular rhythm and normal heart sounds.   Pulmonary/Chest: Effort normal and breath sounds normal.  Abdominal: Soft. Bowel sounds are normal.  Musculoskeletal: Normal range of motion.  Neurological: He is alert.  AAO to self, place, situation, confusion about time, answering most questions and following most commands appropriately; equal strength UE and LE bilaterally; some dysmetria  with finger to nose on right; right facial droop on right at rest but smile appears symmetric; speech somewhat slurred  Skin: Skin is warm and dry. He is not diaphoretic.  Psychiatric: He has a normal mood and affect.  Nursing note and vitals reviewed.   ED Course  Procedures (including critical care time) Labs Review Labs Reviewed  PROTIME-INR - Abnormal; Notable for the following:    Prothrombin Time 22.1 (*)    INR 1.95 (*)    All other components within normal limits  CBC - Abnormal; Notable for the following:    WBC 3.9 (*)    Hemoglobin 12.8 (*)    RDW 16.6 (*)    All other components within normal  limits  COMPREHENSIVE METABOLIC PANEL - Abnormal; Notable for the following:    Albumin 3.2 (*)    AST 127 (*)    ALT 75 (*)    GFR calc non Af Amer 58 (*)    All other components within normal limits  APTT  DIFFERENTIAL  CBG MONITORING, ED  I-STAT TROPOININ, ED  CBG MONITORING, ED  I-STAT CHEM 8, ED    Imaging Review Ct Head Wo Contrast  02/11/2016  CLINICAL DATA:  Right facial droop, dysarthria EXAM: CT HEAD WITHOUT CONTRAST TECHNIQUE: Contiguous axial images were obtained from the base of the skull through the vertex without intravenous contrast. COMPARISON:  01/21/2016 FINDINGS: No skull fracture is noted. No intracranial hemorrhage, mass effect or midline shift. Stable atrophy and chronic white matter disease. No definite acute cortical infarction. No mass lesion is noted on this unenhanced scan. Ventricular size is stable from prior exam. Mild atherosclerotic calcifications of carotid siphon. Paranasal sinuses and mastoid air cells are unremarkable. IMPRESSION: No acute intracranial abnormality. Stable atrophy and chronic white matter disease. No definite acute cortical infarction. These results were called by telephone at the time of interpretation on 02/11/2016 at 11:55 am to Dr. Leonel Ramsay , who verbally acknowledged these results. Electronically Signed   By: Lahoma Crocker  M.D.   On: 02/11/2016 11:56   I have personally reviewed and evaluated these images and lab results as part of my medical decision-making.   EKG Interpretation   Date/Time:  Wednesday Feb 11 2016 11:19:39 EDT Ventricular Rate:  69 PR Interval:  204 QRS Duration: 145 QT Interval:  433 QTC Calculation: 464 R Axis:   32 Text Interpretation:  Sinus rhythm Probable left atrial enlargement Right  bundle branch block Confirmed by Johnney Killian, MD, Jeannie Done [phone]) on 02/11/2016  12:45:31 PM      MDM   Final diagnoses:  Trouble swallowing  Slurred speech  Facial droop   82 year old male here with altered mental status. I evaluated patient on arrival to room. He does have a noted right facial droop and some slurred speech. EMS reported that per family this may be baseline, however this is not documented anywhere in any of his prior physical exams in the ED or from neurology clinic notes. He also reportedly had an unwitnessed fall yesterday. He is on chronic Coumadin. Given questionable changes in mental status and new facial droop, code stroke was activated.    Dr. Leonel Ramsay evaluated patient at the bedside, feels this is more likely related to his Parkinson's disease rather than acute stroke. See his separate note for full details.  Family has arrived and provided additional history that patient was somewhat sleepy and difficult to arouse this morning. He has not yet received his morning sinemet.  Initial head CT negative.  Plan will be for dose of sinemet here, RN stroke swallow screen.  If passing swallow screen and returns to baseline may discharge home.  If not returning to baseline or fail swallow screen will need MRI.  Recommends to increase home sinemet to 25-100 TID if discharged.  1440-- patient reevaluated. His vital signs remained stable, passed swallow screen. He has been able to tolerate half of a [place] sandwich and fluids here in the ED without difficulty.  Family at bedside reports  he has returned to his baseline. His speech is more clear at this time than upon arrival to ED.  Patient has no current complaints.  Given that he appears back to his baseline, will discharge home.  Have  written prescription for TID dosing of his sinemet.  Follow-up with neurology.  Family was given strict return precautions for any recurrent or worsening symptoms including numbness, weakness, recurrent slurred speech, difficulty swallowing, or any other acute changes in mental status. Family acknowledged understanding and agreed with plan of care.  Larene Pickett, PA-C 02/11/16 1515  Charlesetta Shanks, MD 02/12/16 [phone]

## 2016-02-11 NOTE — ED Notes (Signed)
Neurologist at bedside. 

## 2016-02-11 NOTE — Code Documentation (Addendum)
82yo male arriving to [facility] via [place] at 1109.  Patient from home where his daughter noticed him to have difficulty chewing at breakfast.  Due to this patient did not take his Parkinson's medication this morning.  Patient with h/o Parkinson's disease, TIAs and PE on Coumadin.  Of note, patient did sustain a fall yesterday with abrasion to right face.  Patient slow to respond with right facial droop at baseline per patient's daughter.  Code stroke called on arrival to the ED.  Stroke team to the bedside.  Patient transported to CT with team.  NIHSS 3, see documentation for details and code stroke times.  Patient unable to answer questions correctly with mild right facial droop on exam.  Patient is slow to respond, however, can name and repeat and identify objects on exam.  Dr. Leonel Ramsay at the bedside.  Code stroke canceled.  INR 1.95 on arrival.  Bedside handoff with ED RN [place].

## 2016-02-12 NOTE — Progress Notes (Signed)
INTERNAL MEDICINE TEACHING ATTENDING ADDENDUM - Elynn Patteson M.D  Duration- indefinite, Indication- PE, INR- therapeutic. Agree with pharmacy recommendations as outlined in their note.     

## 2016-02-14 LAB — CUP PACEART REMOTE DEVICE CHECK: Date Time Interrogation Session: 20170324003730

## 2016-02-14 NOTE — Progress Notes (Signed)
Carelink summary report received. Battery status OK. Normal device function. No new symptom episodes, tachy episodes, brady, or pause episodes. No new AF episodes. Monthly summary reports and ROV/PRN 

## 2016-02-16 LAB — CUP PACEART REMOTE DEVICE CHECK: MDC IDC SESS DTM: 20170423010535

## 2016-02-16 NOTE — Progress Notes (Signed)
Carelink summary report received. Battery status OK. Normal device function. No new symptom episodes, tachy episodes, brady, or pause episodes. No new AF episodes. Monthly summary reports and ROV/PRN 

## 2016-02-23 ENCOUNTER — Encounter: Payer: Self-pay | Admitting: Internal Medicine

## 2016-02-23 ENCOUNTER — Other Ambulatory Visit: Payer: Self-pay | Admitting: Internal Medicine

## 2016-02-23 ENCOUNTER — Ambulatory Visit (INDEPENDENT_AMBULATORY_CARE_PROVIDER_SITE_OTHER): Payer: Commercial Managed Care - HMO | Admitting: Internal Medicine

## 2016-02-23 VITALS — BP 123/77 | HR 52 | Temp 98.3°F | Ht 67.0 in | Wt 161.6 lb

## 2016-02-23 DIAGNOSIS — B351 Tinea unguium: Secondary | ICD-10-CM | POA: Diagnosis not present

## 2016-02-23 DIAGNOSIS — G219 Secondary parkinsonism, unspecified: Secondary | ICD-10-CM | POA: Diagnosis not present

## 2016-02-23 DIAGNOSIS — R001 Bradycardia, unspecified: Secondary | ICD-10-CM

## 2016-02-23 DIAGNOSIS — R197 Diarrhea, unspecified: Secondary | ICD-10-CM | POA: Diagnosis not present

## 2016-02-23 DIAGNOSIS — I10 Essential (primary) hypertension: Secondary | ICD-10-CM

## 2016-02-23 DIAGNOSIS — K591 Functional diarrhea: Secondary | ICD-10-CM

## 2016-02-23 MED ORDER — ZOSTER VACCINE LIVE 19400 UNT/0.65ML ~~LOC~~ SUSR: 0.6500 mL | Freq: Once | SUBCUTANEOUS | Status: DC

## 2016-02-23 MED ORDER — CARBIDOPA-LEVODOPA 25-100 MG PO TABS: 1.0000 | ORAL_TABLET | Freq: Three times a day (TID) | ORAL | Status: DC

## 2016-02-23 NOTE — Patient Instructions (Signed)
1. I placed the referral to podiatry and I have refilled your Sinemet.  Please return to see [name] in 3 months or sooner if you have problems.   2. Please take all medications as prescribed.    3. If you have worsening of your symptoms or new symptoms arise, please call the clinic [phone]), or go to the ER immediately if symptoms are severe.

## 2016-02-25 NOTE — Progress Notes (Signed)
Subjective:    Patient ID: [name] [name], male    DOB: [birth year], 82 y.o.   MRN: [id number]  HPI Comments: [name] [name] is an 82 year old male with PMH as below here for follow-up of HTN.  Please see problem based charting for the status of this and other chronic conditions.     Past Medical History  Diagnosis Date  . PE (pulmonary embolism)     unprovoked PE completed 6 months of warfarin, warfarin d/ced 02/12/2010, repeat PE 07/10/2010 after a long car ride and now on lifelong coumadin.  [name] Hypertension   . Pituitary microadenoma ([place])     incidental finding CT 12/2009  . Prostate cancer ([place])   . Depression   . Hyperlipidemia   . Coronary artery disease, non-occlusive     with history of MI; Cath 2008 w multivessel nonobstructive CAD  . Dementia   . GERD (gastroesophageal reflux disease)   . Scoliosis   . Anxiety    Current Outpatient Prescriptions on File Prior to Visit  Medication Sig Dispense Refill  . acetaminophen (TYLENOL) 500 MG tablet Take 500 mg by mouth every 4 (four) hours as needed for mild pain or headache.    [name] amLODipine (NORVASC) 2.5 MG tablet TAKE 1 TABLET (2.5 MG TOTAL) BY MOUTH DAILY. 90 tablet 3  . aspirin EC 81 MG EC tablet Take 1 tablet (81 mg total) by mouth daily.    [name] atorvastatin (LIPITOR) 20 MG tablet Take 1 tablet (20 mg total) by mouth daily. 90 tablet 1  . b complex vitamins tablet Take 1 tablet by mouth daily.    . Calcium Carbonate-Vitamin D (CALCIUM-VITAMIN D) 500-200 MG-UNIT per tablet Take 1 tablet by mouth daily.    . fish oil-omega-3 fatty acids 1000 MG capsule Take 2 g by mouth daily.     . furosemide (LASIX) 20 MG tablet Take 1 tablet (20 mg total) by mouth daily.    . Multiple Vitamins-Minerals (MULTIVITAMIN WITH MINERALS) tablet Take 1 tablet by mouth daily.    [name] NAMENDA XR 28 MG CP24 24 hr capsule TAKE ONE CAPSULE BY MOUTH EVERY DAY 30 capsule 6  . omeprazole (PRILOSEC) 20 MG capsule TAKE 1 CAPSULE (20 MG TOTAL) BY MOUTH DAILY. 30  capsule 0  . potassium chloride (K-DUR) 10 MEQ tablet Take 1 tablet (10 mEq total) by mouth as directed. 1st dose take 2 tabs = 20 meq then decrease to 1 tab = 10 meq daily (Patient taking differently: Take 10 mEq by mouth daily. ) 90 tablet 3  . QUEtiapine (SEROQUEL) 25 MG tablet Take 1 tablet (25 mg total) by mouth at bedtime. 90 tablet 1  . vitamin C (ASCORBIC ACID) 500 MG tablet Take 500 mg by mouth daily.    [name] warfarin (COUMADIN) 5 MG tablet TAKE AS DIRECTED. 40 tablet 3  . warfarin (COUMADIN) 5 MG tablet Take 2.5-7.5 mg by mouth daily. Current dosing as of 01/21/16:  2.5mg  Monday, 7.5mg  Wednesday, 5mg  all other days     No current facility-administered medications on file prior to visit.    Review of Systems  Constitutional: Negative for fever, chills and appetite change.  Respiratory: Negative for shortness of breath.   Cardiovascular: Negative for chest pain and palpitations.  Gastrointestinal: Positive for diarrhea. Negative for nausea, vomiting and abdominal pain.  Neurological: Negative for syncope and light-headedness.       Filed Vitals:   02/23/16 1510  BP: 123/77  Pulse: 52  Temp: 98.3 F (  36.8 C)  TempSrc: Oral  Height: 5\' 7"  (1.702 m)  Weight: 161 lb 9.6 oz (73.301 kg)  SpO2: 97%     Objective:   Physical Exam  Constitutional: He is oriented to person, place, and time. He appears well-developed. No distress.  HENT:  Head: Normocephalic.  Mouth/Throat: Oropharynx is clear and moist. No oropharyngeal exudate.  Eyes: Conjunctivae and EOM are normal. Pupils are equal, round, and reactive to light. No scleral icterus.  Neck: Neck supple.  Cardiovascular: Normal rate and normal heart sounds.  Exam reveals no gallop and no friction rub.   No murmur heard. Pulmonary/Chest: Effort normal and breath sounds normal. No respiratory distress. He has no wheezes. He has no rales.  Abdominal: Bowel sounds are normal. He exhibits no distension. There is no tenderness. There is  no rebound and no guarding.  Musculoskeletal: Normal range of motion. He exhibits no edema or tenderness.  Neurological: He is alert and oriented to person, place, and time. No cranial nerve deficit. He exhibits abnormal muscle tone.  B/L rigid extremities due to PD  Skin: Skin is warm. He is not diaphoretic.  Vitals reviewed.         Assessment & Plan:  Please see problem based charting for A&P.

## 2016-02-25 NOTE — Assessment & Plan Note (Signed)
Assessment:  HR low 50 initially but improved on my exam. Asymptomatic.  He has LINQ in place and follows with cardiology. Plan: Continue LINQ.  Follow-up with Cardiology as previously scheduled in September.

## 2016-02-25 NOTE — Assessment & Plan Note (Signed)
Assessment:  Daughter requests referral back to Podiatry. Plan:  Referral to Podiatry placed.

## 2016-02-25 NOTE — Assessment & Plan Note (Signed)
BP Readings from Last 3 Encounters:  02/23/16 123/77  02/11/16 165/110  01/21/16 139/98    Lab Results  Component Value Date   NA 144 02/11/2016   K 4.2 02/11/2016   CREATININE 1.10 02/11/2016    Assessment: Blood pressure control:  well controlled Progress toward BP goal:   at goal Comments: Compliant with amlodipine 2.5mg  daily.  Plan: Medications:  continue current medications:  amlodipine 2.5mg  daily Educational resources provided: brochure (denies ) Self management tools provided:   Other plans: RTC in 3 months for follow-up

## 2016-02-25 NOTE — Assessment & Plan Note (Signed)
Assessment:  Continued episodes of diarrhea but a little less frequent.  He does go some days w/o stool but then may have some days with 3-4 loose BMs.  He has an upcoming appointment with GI given longstanding symptoms. Plan: follow-up with GI next month as previously scheduled.

## 2016-02-25 NOTE — Assessment & Plan Note (Addendum)
Assessment:  Recently seen in ED for confusion and slurred speech.  No acute abnormality on head CT.  Initial concern for stroke but Neuro eval in ED and suspects symptoms were due to PD.  Dose of Sinemet increased at that time to TID.  Patient's daughter notes improvement in symptoms with increased dose.  She can tell when it is wearing off. Plan:  Continue TID Sinemet.  Follow-up with Dr. Leonie Man next month as previously scheduled.  Forward note to Dr. Leonie Man.

## 2016-02-26 ENCOUNTER — Other Ambulatory Visit: Payer: Self-pay

## 2016-02-26 ENCOUNTER — Other Ambulatory Visit: Payer: Self-pay | Admitting: Cardiology

## 2016-02-26 ENCOUNTER — Other Ambulatory Visit: Payer: Self-pay | Admitting: Internal Medicine

## 2016-02-26 DIAGNOSIS — Z9181 History of falling: Secondary | ICD-10-CM

## 2016-02-26 DIAGNOSIS — K219 Gastro-esophageal reflux disease without esophagitis: Secondary | ICD-10-CM

## 2016-02-26 MED ORDER — RANITIDINE HCL 150 MG PO TABS: 150.0000 mg | ORAL_TABLET | Freq: Two times a day (BID) | ORAL | Status: DC

## 2016-02-26 MED ORDER — CALCIUM CITRATE-VITAMIN D 315-200 MG-UNIT PO TABS: 2.0000 | ORAL_TABLET | Freq: Every day | ORAL | Status: DC

## 2016-02-26 NOTE — Telephone Encounter (Signed)
Spoke with daughter who was requesting something other than the omeprazole because it is not covered by the patient's insurance. Spoke with Dr. Loura Back who had sent over a prescription for Zantac. Spoke with daughter and notified of new medication and per Dr. Dois Davenport request told the daughter to look for any dizziness, increased confusion, or urinary retention. Also, per Dr. Redmond Pulling, told daughter to discard the old calcium, vitamin-D supplements and to fill the Citrical prescription due to better absorption with antacids.

## 2016-02-26 NOTE — Telephone Encounter (Signed)
Pt daughter requesting the nurse to call back regarding omeprazole.

## 2016-02-26 NOTE — Progress Notes (Signed)
Internal Medicine Clinic Attending  Case discussed with Dr. Wilson soon after the resident saw the patient.  We reviewed the resident's history and exam and pertinent patient test results.  I agree with the assessment, diagnosis, and plan of care documented in the resident's note.  

## 2016-02-26 NOTE — Telephone Encounter (Signed)
Please advise on refill request as pharmacy is requesting furosemide 20mg  bid, but snapshot/last office visit have 20mg  qd. Per documentation on last visit with Dr Meda Coffee ASSESSMENT AND PLAN:  Chronic Systolic CHF: Volume appears stable, but LE edema, advised to take an extra lasix in the pm if necessary. I suspect a lot of his edema is related to inactivity, venous insufficiency and Amlodipine.  Thanks, MI

## 2016-02-26 NOTE — Telephone Encounter (Signed)
Yes please fill lasix for 20 mg po daily.  Dr Meda Coffee just advised the pt that he could take an extra 20 mg tablet of lasix in the evening if necessary.  Pt should be taking lasix 20 mg po daily as a scheduled med.

## 2016-03-01 ENCOUNTER — Ambulatory Visit: Payer: Commercial Managed Care - HMO

## 2016-03-03 ENCOUNTER — Encounter: Payer: Self-pay | Admitting: *Deleted

## 2016-03-08 ENCOUNTER — Other Ambulatory Visit: Payer: Self-pay

## 2016-03-08 ENCOUNTER — Emergency Department (HOSPITAL_COMMUNITY)
Admission: EM | Admit: 2016-03-08 | Discharge: 2016-03-08 | Disposition: A | Discharge status: Home / Self Care | Payer: Commercial Managed Care - HMO | Attending: Emergency Medicine | Admitting: Emergency Medicine

## 2016-03-08 ENCOUNTER — Encounter (HOSPITAL_COMMUNITY): Payer: Self-pay

## 2016-03-08 ENCOUNTER — Emergency Department (HOSPITAL_COMMUNITY): Payer: Commercial Managed Care - HMO

## 2016-03-08 DIAGNOSIS — Y939 Activity, unspecified: Secondary | ICD-10-CM | POA: Diagnosis not present

## 2016-03-08 DIAGNOSIS — W228XXA Striking against or struck by other objects, initial encounter: Secondary | ICD-10-CM | POA: Insufficient documentation

## 2016-03-08 DIAGNOSIS — Z8546 Personal history of malignant neoplasm of prostate: Secondary | ICD-10-CM | POA: Insufficient documentation

## 2016-03-08 DIAGNOSIS — S0093XA Contusion of unspecified part of head, initial encounter: Secondary | ICD-10-CM | POA: Diagnosis not present

## 2016-03-08 DIAGNOSIS — E785 Hyperlipidemia, unspecified: Secondary | ICD-10-CM | POA: Insufficient documentation

## 2016-03-08 DIAGNOSIS — F0390 Unspecified dementia without behavioral disturbance: Secondary | ICD-10-CM | POA: Diagnosis not present

## 2016-03-08 DIAGNOSIS — Z95818 Presence of other cardiac implants and grafts: Secondary | ICD-10-CM | POA: Diagnosis not present

## 2016-03-08 DIAGNOSIS — Z7901 Long term (current) use of anticoagulants: Secondary | ICD-10-CM | POA: Diagnosis not present

## 2016-03-08 DIAGNOSIS — Y999 Unspecified external cause status: Secondary | ICD-10-CM | POA: Diagnosis not present

## 2016-03-08 DIAGNOSIS — S0191XA Laceration without foreign body of unspecified part of head, initial encounter: Secondary | ICD-10-CM

## 2016-03-08 DIAGNOSIS — Z7982 Long term (current) use of aspirin: Secondary | ICD-10-CM | POA: Insufficient documentation

## 2016-03-08 DIAGNOSIS — W19XXXA Unspecified fall, initial encounter: Secondary | ICD-10-CM

## 2016-03-08 DIAGNOSIS — Z79899 Other long term (current) drug therapy: Secondary | ICD-10-CM | POA: Diagnosis not present

## 2016-03-08 DIAGNOSIS — S199XXA Unspecified injury of neck, initial encounter: Secondary | ICD-10-CM | POA: Diagnosis not present

## 2016-03-08 DIAGNOSIS — F329 Major depressive disorder, single episode, unspecified: Secondary | ICD-10-CM | POA: Insufficient documentation

## 2016-03-08 DIAGNOSIS — I10 Essential (primary) hypertension: Secondary | ICD-10-CM | POA: Insufficient documentation

## 2016-03-08 DIAGNOSIS — S0101XA Laceration without foreign body of scalp, initial encounter: Secondary | ICD-10-CM | POA: Insufficient documentation

## 2016-03-08 DIAGNOSIS — Y92009 Unspecified place in unspecified non-institutional (private) residence as the place of occurrence of the external cause: Secondary | ICD-10-CM | POA: Diagnosis not present

## 2016-03-08 DIAGNOSIS — I2510 Atherosclerotic heart disease of native coronary artery without angina pectoris: Secondary | ICD-10-CM | POA: Diagnosis not present

## 2016-03-08 DIAGNOSIS — S0990XA Unspecified injury of head, initial encounter: Secondary | ICD-10-CM | POA: Diagnosis not present

## 2016-03-08 LAB — COMPREHENSIVE METABOLIC PANEL
ALBUMIN: 2.9 g/dL — AB (ref 3.5–5.0)
ALK PHOS: 110 U/L (ref 38–126)
ALT: 88 U/L — AB (ref 17–63)
AST: 131 U/L — AB (ref 15–41)
Anion gap: 6 (ref 5–15)
BILIRUBIN TOTAL: 1 mg/dL (ref 0.3–1.2)
BUN: 13 mg/dL (ref 6–20)
CALCIUM: 8.9 mg/dL (ref 8.9–10.3)
CO2: 25 mmol/L (ref 22–32)
CREATININE: 1.14 mg/dL (ref 0.61–1.24)
Chloride: 109 mmol/L (ref 101–111)
GFR calc Af Amer: >60 mL/min (ref >60)
GFR calc non Af Amer: 58 mL/min — ABNORMAL LOW (ref >60)
GLUCOSE: 84 mg/dL (ref 65–99)
POTASSIUM: 4.3 mmol/L (ref 3.5–5.1)
Sodium: 140 mmol/L (ref 135–145)
TOTAL PROTEIN: 6.4 g/dL — AB (ref 6.5–8.1)

## 2016-03-08 LAB — PROTIME-INR
INR: 2.21 — ABNORMAL HIGH (ref 0.00–1.49)
Prothrombin Time: 24.3 seconds — ABNORMAL HIGH (ref 11.6–15.2)

## 2016-03-08 LAB — CBC WITH DIFFERENTIAL/PLATELET
BASOS ABS: 0 10*3/uL (ref 0.0–0.1)
BASOS PCT: 1 %
Eosinophils Absolute: 0.2 10*3/uL (ref 0.0–0.7)
Eosinophils Relative: 4 %
HEMATOCRIT: 38.1 % — AB (ref 39.0–52.0)
HEMOGLOBIN: 11.9 g/dL — AB (ref 13.0–17.0)
LYMPHS PCT: 35 %
Lymphs Abs: 1.3 10*3/uL (ref 0.7–4.0)
MCH: 28.4 pg (ref 26.0–34.0)
MCHC: 31.2 g/dL (ref 30.0–36.0)
MCV: 90.9 fL (ref 78.0–100.0)
Monocytes Absolute: 0.4 10*3/uL (ref 0.1–1.0)
Monocytes Relative: 10 %
NEUTROS ABS: 1.8 10*3/uL (ref 1.7–7.7)
NEUTROS PCT: 50 %
Platelets: 195 10*3/uL (ref 150–400)
RBC: 4.19 MIL/uL — AB (ref 4.22–5.81)
RDW: 16.6 % — ABNORMAL HIGH (ref 11.5–15.5)
WBC: 3.7 10*3/uL — ABNORMAL LOW (ref 4.0–10.5)

## 2016-03-08 LAB — URINE MICROSCOPIC-ADD ON: RBC / HPF: NONE SEEN RBC/hpf (ref 0–5)

## 2016-03-08 LAB — I-STAT TROPONIN, ED: Troponin i, poc: 0.03 ng/mL (ref 0.00–0.08)

## 2016-03-08 LAB — URINALYSIS, ROUTINE W REFLEX MICROSCOPIC
BILIRUBIN URINE: NEGATIVE
GLUCOSE, UA: NEGATIVE mg/dL
Hgb urine dipstick: NEGATIVE
Ketones, ur: NEGATIVE mg/dL
NITRITE: POSITIVE — AB
PH: 6.5 (ref 5.0–8.0)
Protein, ur: NEGATIVE mg/dL
SPECIFIC GRAVITY, URINE: 1.013 (ref 1.005–1.030)

## 2016-03-08 LAB — CK
CK TOTAL: 1076 U/L — AB (ref 49–397)
CK TOTAL: 1139 U/L — AB (ref 49–397)

## 2016-03-08 MED ORDER — SODIUM CHLORIDE 0.9 % IV BOLUS (SEPSIS)
500.0000 mL | Freq: Once | INTRAVENOUS | Status: AC
  Administered 2016-03-08: 500 mL via INTRAVENOUS

## 2016-03-08 MED ORDER — CEPHALEXIN 250 MG PO CAPS
500.0000 mg | ORAL_CAPSULE | Freq: Once | ORAL | Status: AC
  Administered 2016-03-08: 500 mg via ORAL
  Filled 2016-03-08: qty 2

## 2016-03-08 NOTE — ED Notes (Signed)
Attempted to ambulate patient with NT. Pt. Able to sit on side of bed but difficulty standing at side of bed. Pt. Unable to ambulate.

## 2016-03-08 NOTE — ED Provider Notes (Signed)
CSN: [id number]     Arrival date & time 03/08/16  1526 History   First MD Initiated Contact with Patient 03/08/16 1538     Chief Complaint  Patient presents with  . Fall  Level V caveat secondary to patient's dementia  HPI History obtained from EMS EMS reports that they were called out due to patient following last night. They report feeling was able to get the patient up and back to bed. They state they were called because he continued to be confused this morning. He has Parkinson's dementia and is somewhat confused at baseline but per their report is normally able to walk, feeds self, and carry on simple conversation. They report a laceration and hematoma to the back of the head. Reports the patient is on Coumadin. Reports that his heart rate has been in the 30s to 60s and route. They state that history was obtained from son and caregiver. Past Medical History  Diagnosis Date  . PE (pulmonary embolism)     unprovoked PE completed 6 months of warfarin, warfarin d/ced 02/12/2010, repeat PE 07/10/2010 after a long car ride and now on lifelong coumadin.  [name] Hypertension   . Pituitary microadenoma ([place])     incidental finding CT 12/2009  . Prostate cancer ([place])   . Depression   . Hyperlipidemia   . Coronary artery disease, non-occlusive     with history of MI; Cath 2008 w multivessel nonobstructive CAD  . Dementia   . GERD (gastroesophageal reflux disease)   . Scoliosis   . Anxiety    Past Surgical History  Procedure Laterality Date  . Prostatectomy    . Left heart catheterization with coronary angiogram N/A 12/10/2014    Procedure: LEFT HEART CATHETERIZATION WITH CORONARY ANGIOGRAM;  Surgeon: [name], MD;  Location: [facility] CATH LAB;  Service: Cardiovascular;  Laterality: N/A;  . Loop recorder implant N/A 12/16/2014    Procedure: LOOP RECORDER IMPLANT;  Surgeon: [name], MD;  Location: [facility] CATH LAB;  Service: Cardiovascular;  Laterality: N/A;   Family History  Problem Relation  Age of Onset  . Hypertension Father     Passed away from cerebral hemorrhage at age of 19.  [name] Dementia Mother     Passed away  . Hypertension Child     4 adult children  . Cervical cancer Other   . Lung cancer Other   . Stroke Other   . Heart attack Neg Hx    Social History  Substance Use Topics  . Smoking status: Never Smoker   . Smokeless tobacco: Never Used  . Alcohol Use: No    Review of Systems  Unable to perform ROS: Dementia      Allergies  Aricept; Other; and Shellfish-derived products  Home Medications   Prior to Admission medications   Medication Sig Start Date End Date Taking? Authorizing Provider  acetaminophen (TYLENOL) 500 MG tablet Take 500 mg by mouth every 4 (four) hours as needed for mild pain or headache.    Historical Provider, MD  amLODipine (NORVASC) 2.5 MG tablet TAKE 1 TABLET (2.5 MG TOTAL) BY MOUTH DAILY. 07/08/15   [name], MD  aspirin EC 81 MG EC tablet Take 1 tablet (81 mg total) by mouth daily. 12/17/14   Isaiah Serge, NP  atorvastatin (LIPITOR) 20 MG tablet Take 1 tablet (20 mg total) by mouth daily. 10/20/15   Dorothy Spark, MD  b complex vitamins tablet Take 1 tablet by mouth daily.  Historical Provider, MD  calcium citrate-vitamin D (CITRACAL+D) 315-200 MG-UNIT tablet Take 2 tablets by mouth daily. 02/26/16   Francesca Oman, DO  carbidopa-levodopa (SINEMET) 25-100 MG tablet Take 1 tablet by mouth 3 (three) times daily. 02/23/16   Francesca Oman, DO  fish oil-omega-3 fatty acids 1000 MG capsule Take 2 g by mouth daily.     Historical Provider, MD  furosemide (LASIX) 20 MG tablet Take 1 tablet (20 mg total) by mouth daily. 04/14/15   Liliane Shi, PA-C  furosemide (LASIX) 20 MG tablet Take 1 tablet (20 mg total) by mouth daily. 02/26/16   Dorothy Spark, MD  Multiple Vitamins-Minerals (MULTIVITAMIN WITH MINERALS) tablet Take 1 tablet by mouth daily.    Historical Provider, MD  NAMENDA XR 28 MG CP24 24 hr capsule TAKE ONE CAPSULE BY  MOUTH EVERY DAY 12/18/15   Garvin Fila, MD  potassium chloride (K-DUR) 10 MEQ tablet Take 1 tablet (10 mEq total) by mouth as directed. 1st dose take 2 tabs = 20 meq then decrease to 1 tab = 10 meq daily Patient taking differently: Take 10 mEq by mouth daily.  07/10/15   [name], MD  QUEtiapine (SEROQUEL) 25 MG tablet Take 1 tablet (25 mg total) by mouth at bedtime. 12/30/15   Garvin Fila, MD  ranitidine (ZANTAC) 150 MG tablet Take 1 tablet (150 mg total) by mouth 2 (two) times daily. 02/26/16 02/25/17  Francesca Oman, DO  vitamin C (ASCORBIC ACID) 500 MG tablet Take 500 mg by mouth daily.    Historical Provider, MD  warfarin (COUMADIN) 5 MG tablet TAKE AS DIRECTED. 12/04/15   Francesca Oman, DO  warfarin (COUMADIN) 5 MG tablet Take 2.5-7.5 mg by mouth daily. Current dosing as of 01/21/16:  2.5mg  Monday, 7.5mg  Wednesday, 5mg  all other days    Historical Provider, MD  Zoster Vaccine Live, PF, (ZOSTAVAX) 16109 UNT/0.65ML injection Inject 19,400 Units into the skin once. 02/23/16   Francesca Oman, DO   BP 162/96 mmHg  Pulse 59  Temp(Src) 96.8 F (36 C) (Rectal)  Resp 10  SpO2 97% Physical Exam  Constitutional: He appears well-developed and well-nourished. No distress.  HENT:  Head: Normocephalic.    8 cm laceration back of head with hair shaved around it that appears to be healing well.  Eyes: Pupils are equal, round, and reactive to light.  Neck: Normal range of motion.  Pulmonary/Chest: Effort normal and breath sounds normal.  Abdominal: Soft. Bowel sounds are normal.  Musculoskeletal:  Decreased range of motion bilateral upper extremities with rigidity noted  Nursing note and vitals reviewed.   ED Course  Procedures (including critical care time) Labs Review Labs Reviewed  CBC WITH DIFFERENTIAL/PLATELET - Abnormal; Notable for the following:    WBC 3.7 (*)    RBC 4.19 (*)    Hemoglobin 11.9 (*)    HCT 38.1 (*)    RDW 16.6 (*)    All other components within normal limits    COMPREHENSIVE METABOLIC PANEL - Abnormal; Notable for the following:    Total Protein 6.4 (*)    Albumin 2.9 (*)    AST 131 (*)    ALT 88 (*)    GFR calc non Af Amer 58 (*)    All other components within normal limits  PROTIME-INR - Abnormal; Notable for the following:    Prothrombin Time 24.3 (*)    INR 2.21 (*)    All other components within normal limits  CK - Abnormal; Notable for the following:    Total CK 1076 (*)    All other components within normal limits  URINALYSIS, ROUTINE W REFLEX MICROSCOPIC (NOT AT [facility])  I-STAT TROPOININ, ED    Imaging Review No results found. I have personally reviewed and evaluated these images and lab results as part of my medical decision-making.   EKG Interpretation   Date/Time:  Monday March 08 2016 15:39:02 EDT Ventricular Rate:  73 PR Interval:    QRS Duration: 148 QT Interval:  454 QTC Calculation: 419 R Axis:   -30 Text Interpretation:  Sinus rhythm Ventricular bigeminy Borderline  prolonged PR interval Probable left atrial enlargement Right bundle branch  block Confirmed by Ronon Ferger MD, Andee Poles [id number]) on 03/08/2016 5:24:38 PM      MDM   Final diagnoses:  Fall, initial encounter  Laceration of head, initial encounter  Chronic anticoagulation      5:37 PM Daughter at bedside. She states patient lives with her but she was out of town. She states that she feels he got up last night and was in a bed that he is not used to being in. He did fall and strike his head. Her brother who is taking care of that time cleansed the wound and clipped around it. She states that when she got home she went him brought in for evaluation, because she knew that with his blood thinners he should be seen if he was acting at all abnormally. She felt like he slept little bit more than usual today. She states that she feels like he is currently back to baseline. Patient week and with poor balance. However, family states this is baseline for him.  1-  fall- head laceration well cared for at time and no bleeding, plan dressing.  No other injury seen.   2-elevated ck- iv hydration given and will recheck-recheck continues elevated at 1100.  Patient had one liter ns here and now is taking po.  Son feels he can take him in for recheck.  3-anemia- mild, family advised 4- parkinson's - general weakness secondary to this with extremity rigidity,  But appears within his usual waxing and waning per family.  5-abnormal ekg- bigeminy resolved on monitor now.  6-uti-plan keflex and urine culture.  Discussed Dr. Randell Patient and she will have clinic call in morning to arrange repeat ck tomorrow.   Patient taking po, plan d/c with follow up ck tomorrow. Family understands need for oral fluids tonight.   Pattricia Boss, MD 03/09/16 [phone]

## 2016-03-08 NOTE — ED Notes (Signed)
Pt. BIB [facility] for evaluation of fall around 1230 AM. Pt. Has hx of dementia/parkinsons. Family reports pt. Was altered this AM. EMS reports pt will answer some simple questions. Reports hematoma to back of head and laceration with bleeding controlled. Pt. Has variable HR 30's-60's. Pt. On coumadin. Pt. Is normally able to ambulate and feed self at baseline.
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
## 2016-03-08 NOTE — Discharge Instructions (Signed)
Please push oral fluids and have ck rechecked tomorrow.  Return if worse at any time.

## 2016-03-09 ENCOUNTER — Telehealth: Payer: Self-pay | Admitting: Internal Medicine

## 2016-03-09 NOTE — Telephone Encounter (Signed)
Spoke w/ daughter, they will be here tomorrow

## 2016-03-09 NOTE — Telephone Encounter (Signed)
The Keflex that he should have been prescribed should be fine for now, we can follow up the Urine Culture to see if changes need to happen but right now that is not back.  I would like him to come in for an appointment tomorrow to recheck his CK level and INR if possible.

## 2016-03-09 NOTE — Telephone Encounter (Signed)
Pt's caretaker, daughter calls and ask if pt needs more abx for uti found in ED last evening? Please advise

## 2016-03-09 NOTE — Telephone Encounter (Signed)
Has some questions for nurse

## 2016-03-10 ENCOUNTER — Ambulatory Visit (INDEPENDENT_AMBULATORY_CARE_PROVIDER_SITE_OTHER): Payer: Commercial Managed Care - HMO | Admitting: Internal Medicine

## 2016-03-10 ENCOUNTER — Encounter: Payer: Self-pay | Admitting: Internal Medicine

## 2016-03-10 ENCOUNTER — Ambulatory Visit (INDEPENDENT_AMBULATORY_CARE_PROVIDER_SITE_OTHER): Payer: Commercial Managed Care - HMO | Admitting: *Deleted

## 2016-03-10 VITALS — BP 172/108 | HR 34 | Temp 98.3°F | Ht 68.0 in | Wt 156.4 lb

## 2016-03-10 DIAGNOSIS — R748 Abnormal levels of other serum enzymes: Secondary | ICD-10-CM | POA: Diagnosis not present

## 2016-03-10 DIAGNOSIS — R7401 Elevation of levels of liver transaminase levels: Secondary | ICD-10-CM

## 2016-03-10 DIAGNOSIS — R55 Syncope and collapse: Secondary | ICD-10-CM

## 2016-03-10 DIAGNOSIS — R740 Nonspecific elevation of levels of transaminase and lactic acid dehydrogenase [LDH]: Secondary | ICD-10-CM

## 2016-03-10 LAB — URINE CULTURE: SPECIAL REQUESTS: NORMAL
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
## 2016-03-10 NOTE — Patient Instructions (Signed)
General Instructions:  I want you to stop taking your Atorvastatin for 1 month, I will then have you back to check on your liver enzymes and muscle enzymes.  I will call with the results of your blood tests.  Please bring your medicines with you each time you come to clinic.  Medicines may include prescription medications, over-the-counter medications, herbal remedies, eye drops, vitamins, or other pills.   Progress Toward Treatment Goals:  Treatment Goal 04/11/2013  Blood pressure at goal  Prevent falls improved    Self Care Goals & Plans:  Self Care Goal 02/23/2016  Manage my medications take my medicines as prescribed; bring my medications to every visit; refill my medications on time  Monitor my health keep track of my blood pressure  Eat healthy foods drink diet soda or water instead of juice or soda; eat more vegetables; eat foods that are low in salt; eat baked foods instead of fried foods; eat fruit for snacks and desserts  Be physically active find an activity I enjoy  Prevent falls have my vision checked  Meeting treatment goals maintain the current self-care plan    No flowsheet data found.   Care Management & Community Referrals:  Referral 01/25/2013  Referrals made to community resources exercise/physical therapy

## 2016-03-11 ENCOUNTER — Encounter (INDEPENDENT_AMBULATORY_CARE_PROVIDER_SITE_OTHER): Payer: Self-pay

## 2016-03-11 ENCOUNTER — Other Ambulatory Visit (INDEPENDENT_AMBULATORY_CARE_PROVIDER_SITE_OTHER): Payer: Commercial Managed Care - HMO

## 2016-03-11 DIAGNOSIS — R740 Nonspecific elevation of levels of transaminase and lactic acid dehydrogenase [LDH]: Secondary | ICD-10-CM | POA: Diagnosis not present

## 2016-03-11 DIAGNOSIS — R748 Abnormal levels of other serum enzymes: Secondary | ICD-10-CM | POA: Diagnosis not present

## 2016-03-11 NOTE — Progress Notes (Signed)
Carelink Summary Report / Loop Recorder 

## 2016-03-12 ENCOUNTER — Telehealth: Payer: Self-pay | Admitting: Cardiology

## 2016-03-12 LAB — HEPATITIS C ANTIBODY

## 2016-03-12 LAB — CK: CK TOTAL: 472 U/L — AB (ref 24–204)

## 2016-03-12 LAB — HEPATIC FUNCTION PANEL
ALBUMIN: 3.6 g/dL (ref 3.5–4.7)
ALT: 66 IU/L — ABNORMAL HIGH (ref 0–44)
AST: 117 IU/L — ABNORMAL HIGH (ref 0–40)
Alkaline Phosphatase: 141 IU/L — ABNORMAL HIGH (ref 39–117)
BILIRUBIN TOTAL: 0.4 mg/dL (ref 0.0–1.2)
BILIRUBIN, DIRECT: 0.15 mg/dL (ref 0.00–0.40)
TOTAL PROTEIN: 7.1 g/dL (ref 6.0–8.5)

## 2016-03-12 LAB — HEPATITIS B SURFACE ANTIBODY,QUALITATIVE: Hep B Surface Ab, Qual: NONREACTIVE

## 2016-03-12 LAB — HEPATITIS A ANTIBODY, TOTAL: Hep A Total Ab: NEGATIVE

## 2016-03-12 LAB — HEPATITIS B SURFACE ANTIGEN: Hepatitis B Surface Ag: NEGATIVE

## 2016-03-12 NOTE — Telephone Encounter (Signed)
LMOVM requesting that pt send manual transmission b/c home monitor has not updated in at least 14 days.    

## 2016-03-15 DIAGNOSIS — T466X5A Adverse effect of antihyperlipidemic and antiarteriosclerotic drugs, initial encounter: Secondary | ICD-10-CM | POA: Insufficient documentation

## 2016-03-15 DIAGNOSIS — R740 Nonspecific elevation of levels of transaminase and lactic acid dehydrogenase [LDH]: Principal | ICD-10-CM

## 2016-03-15 DIAGNOSIS — G720 Drug-induced myopathy: Secondary | ICD-10-CM | POA: Insufficient documentation

## 2016-03-15 DIAGNOSIS — R7401 Elevation of levels of liver transaminase levels: Secondary | ICD-10-CM | POA: Insufficient documentation

## 2016-03-15 NOTE — Assessment & Plan Note (Signed)
-  Repeat CK today, concern for possible statin induced myopathy, will have him hold stating and repeat labs

## 2016-03-15 NOTE — Assessment & Plan Note (Signed)
-   Will repeat LFTs as well as hepatitis screen but given this mild livel of tranaminitis over the last several years I am concerned this could all be statin induced myopathy so I have instructed him to hold his statin until his follow up visit were I will repeat LFTs and CK

## 2016-03-16 ENCOUNTER — Encounter: Payer: Self-pay | Admitting: Internal Medicine

## 2016-03-16 NOTE — Progress Notes (Signed)
Internal Medicine Clinic Attending  Case discussed with Dr. Hoffman soon after the resident saw the patient.  We reviewed the resident's history and exam and pertinent patient test results.  I agree with the assessment, diagnosis, and plan of care documented in the resident's note. 

## 2016-03-19 ENCOUNTER — Encounter: Payer: Self-pay | Admitting: Cardiology

## 2016-03-20 LAB — CUP PACEART REMOTE DEVICE CHECK
Date Time Interrogation Session: 20170523013737
MDC IDC SESS DTM: 20170622020607

## 2016-03-24 ENCOUNTER — Encounter: Payer: Self-pay | Admitting: Neurology

## 2016-03-24 ENCOUNTER — Ambulatory Visit (INDEPENDENT_AMBULATORY_CARE_PROVIDER_SITE_OTHER): Payer: Commercial Managed Care - HMO | Admitting: Neurology

## 2016-03-24 VITALS — BP 140/75 | HR 52 | Ht 67.0 in | Wt 154.8 lb

## 2016-03-24 DIAGNOSIS — G2119 Other drug induced secondary parkinsonism: Secondary | ICD-10-CM

## 2016-03-24 DIAGNOSIS — R269 Unspecified abnormalities of gait and mobility: Secondary | ICD-10-CM | POA: Diagnosis not present

## 2016-03-24 MED ORDER — ROTIGOTINE 1 MG/24HR TD PT24: 1.0000 mg | MEDICATED_PATCH | TRANSDERMAL | Status: DC

## 2016-03-24 NOTE — Patient Instructions (Addendum)
I had a long discussion with the patient, son and his 2 daughters regarding his multifactorial gait difficulties with his likely due to combination of his parkinsonian syndrome as well as advanced dementia. Patient is having trouble tolerating Sinemet hence we will not increase the dose. I recommend adding Neopro 1 mg/24-hour patch and increase slowly as tolerated. I have explained possible side effects with the patient and family. Continue Sinemet  25/100 one tablet 3 times daily for now and reduce it to twice daily once the patient's Neopro is approved. Patient was encouraged to use a walker at all times and he clearly needs 24-hour supervision at home. The family is requesting a letter of support to help him increase availibility of nursing support at home for him. He will stay on Seroquel at night but I advise the family to regularize his sleep-wake cycle and avoid excessive sleeping during the day. She will return for follow-up in 6 months or call earlier if necessary

## 2016-03-24 NOTE — Progress Notes (Signed)
[name] DOB: 1931-05-05  REASON FOR VISIT: routine follow up for Alzheimer's and Parkinson's HISTORY FROM: patient  HISTORY OF PRESENT ILLNESS: UPDATE 01/23/14 (LL):  MMSE today is 23/30, AFT 9 and Clock drawing 1/4. He is tolerating Sinemet tid and Namenda well.  Mood is better on Zoloft. Having difficulty sleeping.  Does not nap during the day.  Has difficulty getting to sleep and wakes frequently.  Has tried Melatonin in the past without benefit.  He used Nortriptyline in the past for insomnia and pain, but at 100 mg dose became toxic (unresponsive).  Daughter notices "sundowning" at night, more confused in nighttime hours.   UPDATE 06/23/13 (LL): [name] [name] comes back for 2 month revisit. Last visit he was started on Sinemet to see if he would have any benefit. Both he and his daughter think that he is moving better since on it. Daughter states that he is no longer "freezing" when he tries to take a step. He is currently taking it twice a day, once when he gets up (around 7-8) and then again at 5 pm. He has no new complaints. MMSE today is 27/30, AFT 8 and Clock drawing 1/4.   UPDATE 05/24/13 (LL): Patient returns for followup. After starting Aricept at last visit daughter reports that he became very weak, confused, and had to be hospitalized. Was found to be bradycardic and symptomatic with heart rate in the 40s, attributable to the Aricept. Medication was stopped and patient was discharged to rehabilitation for short time and has since returned home. Daughter states that he has adjusted somewhat to having moved in with her, is taking Zoloft for depression, and has disinterest in getting out of the house much. States that sometimes it is hard for him to pick up his foot to take a step and it seems to be when he is nervous. Also seems to have tremor when nervous. Has had trouble with insomnia was started on melatonin 5 mg at bedtime with no response. Daughter feels like he has  improved some in his cognitive ability since last visit. MMSE last visit was 23/30. MMSE today is 25/30 with deficits in recall. Clock drawing 0/4. AFT 10. Geriatric depression score is 3 does not suggest depression.   PRIOR HPI 02/05/13 (PS): [name] [name] with remote right hemispheric TIA in May 2011 and mild cognitive impairment. He returns today for followup after his last visit with me on 01/05/2012. Continues to do well from a neurovascular standpoint and has not had a any further stroke or TIAs. He remains on warfarin and is tolerating it well without significant bleeding, bruising or the side effects. He states his blood pressure is under good control and it is 124/76 in office today. He however is had increasing confusion and memory difficulties for the last 1 year which have been progressive. He now needs help with taking his medications. He has stopped going out. He in fact wandered out a few weeks ago from his home and needed help to get back. He denies any agitation or caval changes. He has no significant hallucination or other lesions. His gait and balance are fine and there have been no safety issues. He has now moved and to live with his daughter for the last 2-3 months. The patient did undergo labs for treatable causes of cognitive impairment at last visit and vitamin B12, TSH and RPR were normal on 01/05/2012. His mother did have Alzheimer's.  Update 10/23/2014 : He returns for follow-up after  last visit 9 months ago. He is accompanied by his daughter who provides most of the history. Patient feels he is about the same and the daughter agrees. He continues to have poor short-term memory and needs help with activities like his medications and food. He can dress himself and uses a toilet on most occasions with only occasional help. He sometimes has trouble walking and states that his legs don't move but once he gets started he can walk fairly well. He walks with a stooped posture but his balance is not bad  and he is not had any recent falls. He remains on Namenda XR 28 mg daily which he is tolerating well. He had been on Aricept for years but developed slow heart rate requiring it to be stopped. He has had no interval health problems. He hasn't not been having any significant hallucinations, delusions, agitation or safety concerns. He does get anxious occasionally. The patient's other daughter wants him and his wife to come and stay with her for 3 months and the family is contemplating whether that would be a good move or not Update 04/23/2015 : He returns for follow-up after last visit 6 months ago. He is accompanied by his daughter. The patient was hospitalized in March with syncopal episodes with hypertension and low heart rate. His blood pressure medications were adjusted and tapered and lisinopril and Lasix were discontinued. He undergo a heart monitor for a few days. He is doing better now but blood pressure running in the 102H systolic range. However his been feeling weak all over as has not been walking a lot. He has not been using his walker. He feels his leg is slow and stiff and stick to the ground. He is walking difficulty to the more pronounced when is anxious. He has noticed previously some benefit after adding Sinemet which he takes 25/100 one tablet 3 times daily. Daughter has not noticed any significant further cognitive decline however on Mini-Mental status exam testing today she scored 17 out of 30 which is a significant drop from  22/30 at last visit. He remains on Namenda etc. and in the past has not been able to tolerate Aricept due to his heart rate being slow . Update 09/23/2015 : He returns for follow-up with his last visit with me 5 months ago. He is accompanied by his daughter who provides most of the history. Patient continues to have cognitive decline as well as problems with gait and balance. He remains on Sinemet which he takes in the morning when he gets up as well as close to bedtime.  His daughter feels that towards the evening time his legs dragging and he is unable to move them as well and perhaps he should take the evening dose earlier. He is also had increasing agitation at night and not being able to sleep and he does Nap during the day a lot. He was unable to afford Namenda etc. due to being in the doughnut hole and when he was switched to generic Namenda 10 mg twice daily he seems not to have done as well. On Mini-Mental status score testing today score 9/30 which is a significant decline from 17/30 at last visit. He was seen by Dr. 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 The patient was tried on trazodone but it made him too sleepy during the day and he took only 1 dose. Patient  has recently started home physical therapy. The patient's daughter seems extremely committed to keeping him at home and is looking at options to hire a caregiver Update 03/24/2016 ; he returns for follow-up after last visit 6 months ago. He is accompanied by his son and 2 daughters. Patient congested do poorly and family has noticed worsening of his gait and balance. His shuffling a lot more now and needs a walker and close supervision. He is had a few falls but fortunately not hurt himself. Patient had agitation at night and family had called earlier and hadn't increased her dose of Seroquel to 25 mg at night which works off and on. The family does admit that his sleep-wake cycle may be off and he does sleep a lot during the day and then have trouble at night. Patient now has 24-hour care at home. He was hospitalized in March this year with intestinal obstruction and following surgery is still not having regular bowel movements. The family is applied for more nursing help at home and are requesting a letter of medical necessity from me. He does get confused more frequently now  particularly at night that is sundowning. There have been no definite no delusions or hallucinations  or unsafe behavior REVIEW OF SYSTEMS: Full 14 system review of systems performed and notable only for:  Constipation, , Diarrhea, frequent waking, daytime sleepiness, agitation and all other systems negative   ALLERGIES: Allergies  Allergen Reactions  . Aricept [Donepezil Hcl] Other (See Comments)    Symptomatic Bradycardia.  . Other Other (See Comments)    Shrimp gives him gout  . Shellfish-Derived Products Other (See Comments)    Causes a flare up with Gout    HOME MEDICATIONS: Outpatient Prescriptions Prior to Visit  Medication Sig Dispense Refill  . acetaminophen (TYLENOL) 500 MG tablet Take 500 mg by mouth every 4 (four) hours as needed for mild pain or headache.    [name] amLODipine (NORVASC) 2.5 MG tablet TAKE 1 TABLET (2.5 MG TOTAL) BY MOUTH DAILY. (Patient taking differently: Take 2.5 mg by mouth every morning. ) 90 tablet 3  . atorvastatin (LIPITOR) 20 MG tablet Take 1 tablet (20 mg total) by mouth daily. 90 tablet 1  . b complex vitamins tablet Take 1 tablet by mouth every morning.     . calcium citrate-vitamin D (CITRACAL+D) 315-200 MG-UNIT tablet Take 2 tablets by mouth daily. 90 tablet 1  . carbidopa-levodopa (SINEMET) 25-100 MG tablet Take 1 tablet by mouth 3 (three) times daily. 90 tablet 0  . fish oil-omega-3 fatty acids 1000 MG capsule Take 2 g by mouth every morning.     . furosemide (LASIX) 20 MG tablet Take 1 tablet (20 mg total) by mouth daily. (Patient taking differently: Take 20 mg by mouth every morning. )    . Multiple Vitamins-Minerals (MULTIVITAMIN WITH MINERALS) tablet Take 1 tablet by mouth every morning.     [name] NAMENDA XR 28 MG CP24 24 hr capsule TAKE ONE CAPSULE BY MOUTH EVERY DAY 30 capsule 6  . potassium chloride (K-DUR) 10 MEQ tablet Take 1 tablet (10 mEq total) by mouth as directed. 1st dose take 2 tabs = 20 meq then decrease to 1 tab = 10 meq daily (Patient  taking differently: Take 10 mEq by mouth daily. ) 90 tablet 3  . QUEtiapine (SEROQUEL) 25 MG tablet Take 1 tablet (25 mg total) by mouth at bedtime. 90 tablet 1  . ranitidine (ZANTAC) 150 MG tablet Take 1 tablet (150 mg total)  by mouth 2 (two) times daily. 60 tablet 1  . vitamin C (ASCORBIC ACID) 500 MG tablet Take 500 mg by mouth every morning.     . warfarin (COUMADIN) 5 MG tablet Take 2.5-5 mg by mouth daily. 2.5 mg Monday and Thursday.  5 mg all other days.    [name] aspirin EC 81 MG EC tablet Take 1 tablet (81 mg total) by mouth daily. (Patient not taking: Reported on 03/24/2016)     No facility-administered medications prior to visit.   PHYSICAL EXAM  Filed Vitals:   03/24/16 1424  BP: 140/75  Pulse: 52  Height: 5\' 7"  (1.702 m)  Weight: 154 lb 12.8 oz (70.217 kg)   Body mass index is 24.24 kg/(m^2).  Generalized: In no acute distress, pleasant  Frail elderly AA [name], well groomed, well developed  Neck: Supple, no carotid bruits  Cardiac: Irregular rate and rhythm, soft 1/6 murmur  Pulmonary: Clear to auscultation bilaterally  Musculoskeletal: mild kyphosis   Neurological examination  MMSE history: 09/23/15 MMSE 9/30 04/23/2015 : MMSE 17/30 10/23/14 MMSE 22/30, AFT 11, Clock 1/4, GDS 5 01/23/14:   MMSE 23/30, recall 1/3, AFT 9 and Clock Drawing 1/4. 06/23/13:  MMSE 27/30, recall 3/3, AFT 8 and Clock drawing 1/4. GDS 5. 02/05/13:  MMSE 25/30, recall 1/3, AFT 10, Clock drawing 0/4. GDS 3.  Mentation: Alert, language fluent. Mini-Mental status exam  Not done today. Deficits in orientation, attention, recall and following three-step commands. Animal naming unable. Clock drawing  unable. Mood and affect appropriate. MASKED FACIES.   Cranial nerve II-XII: Pupils were equal round reactive to light extraocular movements were full, visual field were full on confrontational test. facial sensation and strength were normal. hearing was intact to finger rubbing bilaterally. Uvula tongue midline.     MOTOR: normal bulk and tone, full strength in the BUE, BLE, fine finger movements normal, no pronator drift. MILD COGWHEEL RIGIDIITY IN ARMS AND WRISTS, L>R.  SENSORY: normal and symmetric to light touch  COORDINATION: Normal finger-nose-finger, heel-to-shin bilaterally.  REFLEXES: 1+ and symmetric. POSITIVE MYERSON'S, POSITIVE SNOUT, NEGATIVE PALMOMENTAL.  GAIT/STATION: Rising up from seated position without assistance but significant initiation apraxia., STOOPED STANCE, without truncal ataxia, moderate stride, POOR ARM SWING ON RIGHT, TURNS IN 3 STEPS,  uses a wheeled walker. ASSESSMENT AND PLAN 82 year old AA [name] patient with mild cognitive impairment which appears to progressed to moderate dementia. 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 Remote history of right brain TIA in May 2011 and stable from a neurovascular standpoint. Aricept caused bradycardia. 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 PLAN:  I had a long discussion with the patient, son and his 2 daughters regarding his multifactorial gait difficulties with his likely due to combination of his parkinsonian syndrome as well as advanced dementia. Patient is having trouble tolerating Sinemet hence we will not increase the dose. I recommend adding Neopro 1 mg/24-hour patch and increase slowly as tolerated. I have explained possible side effects with the patient and family. Continue Sinemet  25/100 one tablet 3 times daily for now and reduce it to twice daily once the patient's Neopro is approved. Patient was encouraged to use a walker at all times and he clearly needs 24-hour supervision at home. The family is requesting a letter of support to help him increase availibility of nursing support at home for him. He will stay on Seroquel at night but  I advise the family to regularize his sleep-wake cycle and avoid excessive sleeping  during the day. She will return for follow-up in 6 months or call earlier if necessary. Greater than 50% time during this 25 minute visit was spent on counseling and coordination of care about stroke risk, prevention and treatment   [name], MD  03/24/2016, 6:22 PM [facility] [address] [place], [place] [address] [phone]  Note: This document was prepared with digital dictation and possible smart phrase technology. Any transcriptional errors that result from this process are unintentional.

## 2016-04-06 ENCOUNTER — Encounter: Payer: Self-pay | Admitting: Internal Medicine

## 2016-04-06 ENCOUNTER — Ambulatory Visit: Payer: Commercial Managed Care - HMO | Admitting: Internal Medicine

## 2016-04-08 ENCOUNTER — Encounter: Payer: Commercial Managed Care - HMO | Admitting: Internal Medicine

## 2016-04-09 ENCOUNTER — Ambulatory Visit (INDEPENDENT_AMBULATORY_CARE_PROVIDER_SITE_OTHER): Payer: Commercial Managed Care - HMO | Admitting: *Deleted

## 2016-04-09 DIAGNOSIS — R55 Syncope and collapse: Secondary | ICD-10-CM

## 2016-04-12 NOTE — Progress Notes (Signed)
Carelink Summary Report / Loop Recorder 

## 2016-04-19 ENCOUNTER — Ambulatory Visit (INDEPENDENT_AMBULATORY_CARE_PROVIDER_SITE_OTHER): Payer: Commercial Managed Care - HMO | Admitting: Pharmacist

## 2016-04-19 DIAGNOSIS — I2782 Chronic pulmonary embolism: Secondary | ICD-10-CM | POA: Diagnosis not present

## 2016-04-19 DIAGNOSIS — Z7902 Long term (current) use of antithrombotics/antiplatelets: Secondary | ICD-10-CM

## 2016-04-19 DIAGNOSIS — Z7901 Long term (current) use of anticoagulants: Secondary | ICD-10-CM | POA: Diagnosis not present

## 2016-04-19 LAB — POCT INR: INR: 1.7

## 2016-04-19 NOTE — Patient Instructions (Signed)
Patient educated about medication as defined in this encounter and verbalized understanding by repeating back instructions provided.   

## 2016-04-19 NOTE — Progress Notes (Signed)
Anticoagulation Management [name] [name] is a 82 y.o. male who reports to the clinic for monitoring of warfarin treatment.   Indication: PEand history of recurrent VTE Duration: indefinite  Anticoagulation Clinic Visit History: Patient does not report signs/symptoms of bleeding or thromboembolism  or any other changes  Anticoagulation Episode Summary    Current INR goal:   2.0-3.0  TTR:   73.2 % (5.3 y)  Next INR check:   05/17/2016  INR from last check:   1.7! (04/19/2016)  Weekly max dose:     Target end date:   Indefinite  INR check location:   [facility]  Preferred lab:     Send INR reminders to:   [facility]   Indications   Pulmonary embolism ([place]) [I26.99] Encounter for long-term use of antiplatelets/antithrombotics [Z79.02]       Comments:   History of multiple venous embolic episodes. Will continue to annual re-evaluate continued need for warfarin weighing risks vs. benefits.       Anticoagulation Care Providers    Provider Role Specialty Phone number   Bertha Stakes, MD  Internal Medicine [phone]     ASSESSMENT Recent Results: The most recent result is correlated with 30 mg per week: Lab Results  Component Value Date   INR 1.7 04/19/2016   INR 2.21 (H) 03/08/2016   INR 1.95 (H) 02/11/2016    Anticoagulation Dosing: INR as of 04/19/2016 and Previous Dosing Information    INR Dt INR Goal [employer] Sun Mon Tue Wed Thu Fri Sat   04/19/2016 1.7 2.0-3.0 30 mg 5 mg 2.5 mg 5 mg 5 mg 2.5 mg 5 mg 5 mg    Anticoagulation Dose Instructions as of 04/19/2016      Total Sun Mon Tue Wed Thu Fri Sat   New Dose 32.5 mg 5 mg 5 mg 5 mg 5 mg 2.5 mg 5 mg 5 mg     (5 mg x 1)  (5 mg x 1)  (5 mg x 1)  (5 mg x 1)  (5 mg x 0.5)  (5 mg x 1)  (5 mg x 1)                           INR today: Subtherapeutic  PLAN Weekly dose was increased by 8% to 32.5 mg per week  Patient Instructions  Patient educated about medication as defined in this encounter and verbalized  understanding by repeating back instructions provided.    Patient advised to contact clinic or seek medical attention if signs/symptoms of bleeding or thromboembolism occur.  Patient verbalized understanding by repeating back information and was advised to contact me if further medication-related questions arise. Patient was also provided an information handout.  Follow-up Return in about 4 weeks (around 05/17/2016) for Follow up INR 05/17/16 around 2pm.  [name],[name]  15 minutes spent face-to-face with the patient during the encounter. 50% of time spent on education. 50% of time was spent on assessment and plan.

## 2016-04-26 ENCOUNTER — Telehealth: Payer: Self-pay | Admitting: Internal Medicine

## 2016-04-26 NOTE — Telephone Encounter (Signed)
APT. REMINDER CALL, LMTCB °

## 2016-04-27 ENCOUNTER — Encounter: Payer: Self-pay | Admitting: Internal Medicine

## 2016-04-27 ENCOUNTER — Ambulatory Visit (INDEPENDENT_AMBULATORY_CARE_PROVIDER_SITE_OTHER): Payer: Commercial Managed Care - HMO | Admitting: Internal Medicine

## 2016-04-27 VITALS — BP 137/96 | HR 67 | Temp 97.7°F | Ht 68.0 in | Wt 158.9 lb

## 2016-04-27 DIAGNOSIS — R740 Nonspecific elevation of levels of transaminase and lactic acid dehydrogenase [LDH]: Secondary | ICD-10-CM

## 2016-04-27 DIAGNOSIS — R7401 Elevation of levels of liver transaminase levels: Secondary | ICD-10-CM

## 2016-04-27 DIAGNOSIS — K219 Gastro-esophageal reflux disease without esophagitis: Secondary | ICD-10-CM

## 2016-04-27 NOTE — Assessment & Plan Note (Signed)
Pt. Was accompanied with his daughter today, she C/O increased in his confusion after taking Zantac. He was switched to Zantac in June due to drug interactions. He was taking PPI for long time and she was not sure of any GERD symptoms. PLAN: -Discontinue Zantac -Watch for any recurrent GERD symptoms, if patient has a recurrence, we can try PPI or Femotidine for shorter duration.

## 2016-04-27 NOTE — Patient Instructions (Signed)
It was pleasure taking care of you today. Please stop Zantac as it increases your mental confusion, watch for any signs of worsening GERD, like burping, bad taste, unexplained cough especially while lying down. Please call our clinic if you are concerned with those symptoms so we can restart any other medicine. We will inform you with your blood results, please do not take Lipitor until we advise you. If your liver function shows the Lipitor was the cause of that, we will change your lipid medicine accordingly.

## 2016-04-27 NOTE — Progress Notes (Signed)
   CC: None, Transaminitis follow up.  HPI:  Mr.Nazar Tripplett is a 82 y.o. with PMH of Pulmonary embolism on life long anticoagulation,CAD,CVA,HTN, Parkinsonian syndrome,DJD,came to the clinic for repeat liver function and CK testing. He was found to have elevated ALT, AST, Alkaline transferase and CK over the last year.Hepatitis screen was negative. Statin myopathy was suspected and he was instructed to stop taking Lipitor. Pt. Was accompanied with his daughter today, she C/O increased in his confusion after taking Zantac. He was switched to Zantac in June due to drug interactions of his Omeprazole. Denies any N/V. Abdominal pain, muscle aches, fever, headaches, chest pain, dysuria.    Past Medical History:  Diagnosis Date  . Anxiety   . Coronary artery disease, non-occlusive    with history of MI; Cath 2008 w multivessel nonobstructive CAD  . Dementia   . Depression   . GERD (gastroesophageal reflux disease)   . Hyperlipidemia   . Hypertension   . PE (pulmonary embolism)    unprovoked PE completed 6 months of warfarin, warfarin d/ced 02/12/2010, repeat PE 07/10/2010 after a long car ride and now on lifelong coumadin.  . Pituitary microadenoma ([place])    incidental finding CT 12/2009  . Prostate cancer ([place])   . Scoliosis     Review of Systems:  A complete ROS was negative except as per HPI.   Physical Exam:  Vitals:   04/27/16 1112  BP: (!) 137/96  Pulse: 67  Temp: 97.7 F (36.5 C)  TempSrc: Oral  SpO2: 98%  Weight: 158 lb 14.4 oz (72.1 kg)  Height: 5\' 8"  (1.727 m)    General: Vital signs reviewed.  Patient is well-developed and well-nourished, in no acute distress and cooperative with exam.  Head: Normocephalic and atraumatic. Eyes: EOMI, conjunctivae normal, no scleral icterus.  Neck: Supple, trachea midline, normal ROM, no JVD, masses, thyromegaly, or carotid bruit present.  Cardiovascular: RRR, S1 normal, S2 normal, no murmurs, gallops, or rubs. Pulmonary/Chest:  Clear to auscultation bilaterally, no wheezes, rales, or rhonchi. Abdominal: Soft, non-tender, non-distended, BS +, no masses, organomegaly, or guarding present.  Extremities: No lower extremity edema bilaterally,  pulses symmetric and intact bilaterally. No cyanosis or clubbing. Neurological: A&O x3, Strength is normal and symmetric bilaterally, cranial nerve II-XII are grossly intact, no focal motor deficit, sensory intact to light touch bilaterally.  Skin: Warm, dry and intact. No rashes or erythema. Psychiatric: Normal mood and Flat  affect.  Assessment & Plan:   See Encounters Tab for problem based charting.  Patient seen with Dr. Daryll Drown

## 2016-04-27 NOTE — Assessment & Plan Note (Signed)
[name] is a 82 y.o. with PMH of Pulmonary embolism on life long anticoagulation,CAD,CVA,HTN, Parkinsonian syndrome,DJD,came to the clinic for repeat liver function and CK testing. He was found to have elevated ALT, AST, Alkaline transferase and CK over the last year.Hepatitis screen was negative. Statin myopathy was suspected and he was instructed to stop taking Lipitor.He is off the Lipitor now for over a month. Plan: -Repeat CMP, and CK -Discontinue Lipitor until repeat CMP and CK results available and if the statins are responsible for his chronically elevated liver enzymes,considering changing the medicine with another class.

## 2016-04-28 LAB — CMP14 + ANION GAP
A/G RATIO: 1.3 (ref 1.2–2.2)
ALK PHOS: 69 IU/L (ref 39–117)
ALT: 29 IU/L (ref 0–44)
AST: 44 IU/L — AB (ref 0–40)
Albumin: 3.9 g/dL (ref 3.5–4.7)
Anion Gap: 19 mmol/L — ABNORMAL HIGH (ref 10.0–18.0)
BILIRUBIN TOTAL: 0.3 mg/dL (ref 0.0–1.2)
BUN/Creatinine Ratio: 11 (ref 10–24)
BUN: 13 mg/dL (ref 8–27)
CHLORIDE: 105 mmol/L (ref 96–106)
CO2: 23 mmol/L (ref 18–29)
Calcium: 9.3 mg/dL (ref 8.6–10.2)
Creatinine, Ser: 1.23 mg/dL (ref 0.76–1.27)
GFR calc Af Amer: 62 mL/min/{1.73_m2} (ref >59)
GFR calc non Af Amer: 54 mL/min/{1.73_m2} — ABNORMAL LOW (ref >59)
GLUCOSE: 77 mg/dL (ref 65–99)
Globulin, Total: 3 g/dL (ref 1.5–4.5)
POTASSIUM: 3.7 mmol/L (ref 3.5–5.2)
Sodium: 147 mmol/L — ABNORMAL HIGH (ref 134–144)
Total Protein: 6.9 g/dL (ref 6.0–8.5)

## 2016-04-28 LAB — CK: CK TOTAL: 194 U/L (ref 24–204)

## 2016-04-29 ENCOUNTER — Encounter: Payer: Self-pay | Admitting: Internal Medicine

## 2016-04-29 NOTE — Progress Notes (Signed)
I have reviewed Dr. Julianne Rice note.  Patient is on [facility] for recurrent VTE.  INR was low and coumadin increased.

## 2016-04-30 NOTE — Progress Notes (Signed)
Internal Medicine Clinic Attending  I saw and evaluated the patient.  I personally confirmed the key portions of the history and exam documented by Dr. Amin and I reviewed pertinent patient test results.  The assessment, diagnosis, and plan were formulated together and I agree with the documentation in the resident's note. 

## 2016-05-05 ENCOUNTER — Ambulatory Visit (INDEPENDENT_AMBULATORY_CARE_PROVIDER_SITE_OTHER): Payer: Commercial Managed Care - HMO | Admitting: Podiatry

## 2016-05-05 ENCOUNTER — Encounter: Payer: Self-pay | Admitting: Podiatry

## 2016-05-05 DIAGNOSIS — M79676 Pain in unspecified toe(s): Secondary | ICD-10-CM | POA: Diagnosis not present

## 2016-05-05 DIAGNOSIS — B351 Tinea unguium: Secondary | ICD-10-CM

## 2016-05-05 NOTE — Progress Notes (Signed)
Patient ID: [name] [name], male   DOB: 07/03/1931, 82 y.o.   MRN: [id number] Complaint:  Visit Type: Patient returns to my office for continued preventative foot care services. Complaint: Patient states" my nails have grown long and thick and become painful to walk and wear shoes" . The patient presents for preventative foot care services. No changes to ROS  Podiatric Exam: Vascular: dorsalis pedis and posterior tibial pulses are palpable bilateral. Capillary return is immediate. Temperature gradient is WNL. Skin turgor WNL  Sensorium: Normal Semmes Weinstein monofilament test. Normal tactile sensation bilaterally. Nail Exam: Pt has thick disfigured discolored nails with subungual debris noted bilateral entire nail hallux through fifth toenails Ulcer Exam: There is no evidence of ulcer or pre-ulcerative changes or infection. Orthopedic Exam: Muscle tone and strength are WNL. No limitations in general ROM. No crepitus or effusions noted. Foot type and digits show no abnormalities. Bony prominences are unremarkable. Hyperpigmentation due to gout. Skin: No Porokeratosis. No infection or ulcers  Diagnosis:  Onychomycosis, , Pain in right toe, pain in left toes  Treatment & Plan Procedures and Treatment: Consent by patient was obtained for treatment procedures. The patient understood the discussion of treatment and procedures well. All questions were answered thoroughly reviewed. Debridement of mycotic and hypertrophic toenails, 1 through 5 bilateral and clearing of subungual debris. No ulceration, no infection noted.  Return Visit-Office Procedure: Patient instructed to return to the office for a follow up visit 10 weeks  for continued evaluation and treatment.   Donnell Wion DPM    [name] [name] DPM 

## 2016-05-06 ENCOUNTER — Ambulatory Visit: Payer: Commercial Managed Care - HMO | Admitting: Podiatry

## 2016-05-06 LAB — CUP PACEART REMOTE DEVICE CHECK: Date Time Interrogation Session: 20170722030601

## 2016-05-10 ENCOUNTER — Ambulatory Visit (INDEPENDENT_AMBULATORY_CARE_PROVIDER_SITE_OTHER): Payer: Commercial Managed Care - HMO | Admitting: *Deleted

## 2016-05-10 DIAGNOSIS — R55 Syncope and collapse: Secondary | ICD-10-CM | POA: Diagnosis not present

## 2016-05-10 NOTE — Progress Notes (Signed)
Carelink Summary Report / Loop Recorder 

## 2016-05-17 ENCOUNTER — Ambulatory Visit (INDEPENDENT_AMBULATORY_CARE_PROVIDER_SITE_OTHER): Payer: Commercial Managed Care - HMO | Admitting: Pharmacist

## 2016-05-17 DIAGNOSIS — I2782 Chronic pulmonary embolism: Secondary | ICD-10-CM

## 2016-05-17 DIAGNOSIS — Z7901 Long term (current) use of anticoagulants: Secondary | ICD-10-CM

## 2016-05-17 DIAGNOSIS — Z7902 Long term (current) use of antithrombotics/antiplatelets: Secondary | ICD-10-CM

## 2016-05-17 LAB — POCT INR: INR: 2.8

## 2016-05-17 NOTE — Patient Instructions (Signed)
Patient instructed to take medications as defined in the Anti-coagulation Track section of this encounter.  Patient instructed to take today's dose.  Patient verbalized understanding of these instructions.    

## 2016-05-17 NOTE — Progress Notes (Signed)
Anti-Coagulation Progress Note  [name] [name] is a 82 y.o. male who is currently on an anti-coagulation regimen.    RECENT RESULTS: Recent results are below, the most recent result is correlated with a dose of 32.5 mg. per week: Lab Results  Component Value Date   INR 2.8 05/17/2016   INR 1.7 04/19/2016   INR 2.21 (H) 03/08/2016    ANTI-COAG DOSE: Anticoagulation Dose Instructions as of 05/17/2016      [name] Tue Wed Thu Fri Sat   New Dose 5 mg 5 mg 5 mg 5 mg 2.5 mg 5 mg 5 mg       ANTICOAG SUMMARY: Anticoagulation Episode Summary    Current INR goal:   2.0-3.0  TTR:   73.2 % (5.4 y)  Next INR check:   06/07/2016  INR from last check:   2.8 (05/17/2016)  Weekly max dose:     Target end date:   Indefinite  INR check location:   [facility]  Preferred lab:     Send INR reminders to:   ANTICOAG IMP   Indications   Pulmonary embolism ([place]) [I26.99] Encounter for long-term use of antiplatelets/antithrombotics [Z79.02]       Comments:   History of multiple venous embolic episodes. Will continue to annual re-evaluate continued need for warfarin weighing risks vs. benefits.       Anticoagulation Care Providers    Provider Role Specialty Phone number   Bertha Stakes, MD  Internal Medicine [phone]      ANTICOAG TODAY: Anticoagulation Summary  As of 05/17/2016   INR goal:   2.0-3.0  TTR:     Today's INR:   2.8  Next INR check:   06/07/2016  Target end date:   Indefinite   Indications   Pulmonary embolism ([place]) [I26.99] Encounter for long-term use of antiplatelets/antithrombotics [Z79.02]        Anticoagulation Episode Summary    INR check location:   [facility]   Preferred lab:      Send INR reminders to:   ANTICOAG IMP   Comments:   History of multiple venous embolic episodes. Will continue to annual re-evaluate continued need for warfarin weighing risks vs. benefits.     Anticoagulation Care Providers    Provider Role Specialty Phone number   Bertha Stakes, MD  Internal Medicine [phone]      PATIENT INSTRUCTIONS: There are no Patient Instructions on file for this visit.   FOLLOW-UP Return in about 3 weeks (around 06/07/2016) for F/U INR 245PM.  Arrie Senate, PharmD PGY-1 Pharmacy Resident Pager: [phone]

## 2016-05-19 NOTE — Progress Notes (Signed)
INTERNAL MEDICINE TEACHING ATTENDING ADDENDUM - Duncan Vincent M.D  Duration- indefinite, Indication- recurrent VTE, INR- therapeutic. Agree with pharmacy recommendations as outlined in their note.   

## 2016-05-25 ENCOUNTER — Encounter: Payer: Self-pay | Admitting: Internal Medicine

## 2016-05-25 ENCOUNTER — Encounter: Payer: Self-pay | Admitting: Cardiology

## 2016-06-06 ENCOUNTER — Other Ambulatory Visit: Payer: Self-pay | Admitting: Internal Medicine

## 2016-06-06 DIAGNOSIS — Z9181 History of falling: Secondary | ICD-10-CM

## 2016-06-07 ENCOUNTER — Ambulatory Visit: Payer: Commercial Managed Care - HMO

## 2016-06-07 LAB — CUP PACEART REMOTE DEVICE CHECK: MDC IDC SESS DTM: 20170821031048

## 2016-06-07 NOTE — Progress Notes (Signed)
Carelink summary report received. Battery status OK. Normal device function. No new symptom episodes, tachy episodes, brady, or pause episodes. No new AF episodes. Monthly summary reports and ROV/PRN 

## 2016-06-08 ENCOUNTER — Ambulatory Visit (INDEPENDENT_AMBULATORY_CARE_PROVIDER_SITE_OTHER): Payer: Commercial Managed Care - HMO | Admitting: *Deleted

## 2016-06-08 DIAGNOSIS — R55 Syncope and collapse: Secondary | ICD-10-CM | POA: Diagnosis not present

## 2016-06-09 ENCOUNTER — Ambulatory Visit: Payer: Commercial Managed Care - HMO | Admitting: Cardiology

## 2016-06-09 NOTE — Progress Notes (Signed)
Carelink Summary Report / Loop Recorder 

## 2016-06-13 ENCOUNTER — Other Ambulatory Visit: Payer: Self-pay | Admitting: Internal Medicine

## 2016-06-14 ENCOUNTER — Ambulatory Visit (INDEPENDENT_AMBULATORY_CARE_PROVIDER_SITE_OTHER): Payer: Commercial Managed Care - HMO | Admitting: Pharmacist

## 2016-06-14 DIAGNOSIS — I2699 Other pulmonary embolism without acute cor pulmonale: Secondary | ICD-10-CM

## 2016-06-14 DIAGNOSIS — Z7901 Long term (current) use of anticoagulants: Secondary | ICD-10-CM | POA: Diagnosis not present

## 2016-06-14 DIAGNOSIS — Z7902 Long term (current) use of antithrombotics/antiplatelets: Secondary | ICD-10-CM

## 2016-06-14 LAB — POCT INR: INR: 3.4

## 2016-06-14 NOTE — Patient Instructions (Signed)
Patient instructed to take medications as defined in the Anti-coagulation Track section of this encounter.  Patient instructed to take today's dose.  Patient verbalized understanding of these instructions.    

## 2016-06-14 NOTE — Progress Notes (Signed)
Anti-Coagulation Progress Note  [name] [name] is a 82 y.o. male who is currently on an anti-coagulation regimen.    RECENT RESULTS: Recent results are below, the most recent result is correlated with a dose of 32.5 mg. per week: Lab Results  Component Value Date   INR 3.4 06/14/2016   INR 2.8 05/17/2016   INR 1.7 04/19/2016    ANTI-COAG DOSE: Anticoagulation Dose Instructions as of 06/14/2016      [name] Tue Wed Thu Fri Sat   New Dose 5 mg 2.5 mg 5 mg 5 mg 2.5 mg 5 mg 5 mg       ANTICOAG SUMMARY: Anticoagulation Episode Summary    Current INR goal:   2.0-3.0  TTR:   72.7 % (5.5 y)  Next INR check:   07/05/2016  INR from last check:   3.4! (06/14/2016)  Weekly max dose:     Target end date:   Indefinite  [facility]   [facility]  Preferred lab:     Send INR reminders to:   ANTICOAG IMP   Indications   Pulmonary embolism ([place]) [I26.99] Encounter for long-term use of antiplatelets/antithrombotics [Z79.02]       Comments:   History of multiple venous embolic episodes. Will continue to annual re-evaluate continued need for warfarin weighing risks vs. benefits.       Anticoagulation Care Providers    Provider Role Specialty Phone number   Bertha Stakes, MD  Internal Medicine [phone]      ANTICOAG TODAY: Anticoagulation Summary  As of 06/14/2016   INR goal:   2.0-3.0  TTR:     Today's INR:   3.4!  Next INR check:   07/05/2016  Target end date:   Indefinite   Indications   Pulmonary embolism ([place]) [I26.99] Encounter for long-term use of antiplatelets/antithrombotics [Z79.02]        Anticoagulation Episode Summary    [facility] check location[facility]   [facility]   Preferred lab:      Send INR reminders to:   ANTICOAG IMP   Comments:   History of multiple venous embolic episodes. Will continue to annual re-evaluate continued need for warfarin weighing risks vs. benefits.     Anticoagulation Care Providers    Provider Role Specialty Phone number   Bertha Stakes, MD  Internal Medicine [phone]      PATIENT INSTRUCTIONS: There are no Patient Instructions on file for this visit.   FOLLOW-UP Return in about 3 weeks (around 07/05/2016) for Follow up INR at 2:15PM.  Jorene Guest, [name] Pharm.D., CACP

## 2016-06-14 NOTE — Progress Notes (Signed)
INTERNAL MEDICINE TEACHING ATTENDING ADDENDUM - Lalla Brothers M.D  Duration- lifelong, Indication- recurrent vte, INR- 3.4. Agree with pharmacy recommendations as outlined in their note.

## 2016-06-20 ENCOUNTER — Other Ambulatory Visit: Payer: Self-pay | Admitting: Internal Medicine

## 2016-06-20 ENCOUNTER — Other Ambulatory Visit: Payer: Self-pay | Admitting: Neurology

## 2016-06-20 DIAGNOSIS — R451 Restlessness and agitation: Secondary | ICD-10-CM

## 2016-06-20 DIAGNOSIS — K219 Gastro-esophageal reflux disease without esophagitis: Secondary | ICD-10-CM

## 2016-06-21 ENCOUNTER — Other Ambulatory Visit: Payer: Self-pay

## 2016-06-21 DIAGNOSIS — R451 Restlessness and agitation: Secondary | ICD-10-CM

## 2016-06-21 MED ORDER — QUETIAPINE FUMARATE 25 MG PO TABS: 25.0000 mg | ORAL_TABLET | Freq: Every day | ORAL | 4 refills | Status: DC

## 2016-06-22 ENCOUNTER — Ambulatory Visit (INDEPENDENT_AMBULATORY_CARE_PROVIDER_SITE_OTHER): Payer: Commercial Managed Care - HMO | Admitting: Internal Medicine

## 2016-06-22 ENCOUNTER — Other Ambulatory Visit: Payer: Self-pay | Admitting: Neurology

## 2016-06-22 ENCOUNTER — Encounter: Payer: Self-pay | Admitting: Internal Medicine

## 2016-06-22 VITALS — BP 138/80 | HR 68

## 2016-06-22 DIAGNOSIS — R197 Diarrhea, unspecified: Secondary | ICD-10-CM

## 2016-06-22 DIAGNOSIS — R451 Restlessness and agitation: Secondary | ICD-10-CM

## 2016-06-22 DIAGNOSIS — K5909 Other constipation: Secondary | ICD-10-CM | POA: Diagnosis not present

## 2016-06-22 NOTE — Patient Instructions (Addendum)
   Choose a good day to do the Miralax purge.   Dr Carlean Purl recommends that you complete a bowel purge (to clean out your bowels). Please do the following: Purchase a bottle of Miralax over the counter as well as a box of 5 mg dulcolax tablets. Take 4 dulcolax tablets. Wait 1 hour. You will then drink 6-8 capfuls of Miralax mixed in an adequate amount of water/juice/gatorade (you may choose which of these liquids to drink) over the next 2-3 hours. You should expect results within 1 to 6 hours after completing the bowel purge.   Then take 1 dose of MiraLax every day and if no bowel movement in 2 days take 1-2 dulcolax or Sennokot.  If that is not working well let Dr. Carlean Purl know by calling back.  I appreciate the opportunity to care for you. Gatha Mayer, MD, [facility]

## 2016-06-22 NOTE — Progress Notes (Signed)
[name] [name] 82 y.o. [birth year] [id number] Referred by: [name] [name], MD  Assessment & Plan:   1. Other constipation   2. Diarrhea, unspecified type    Seems most like a high impaction and overflow problem based upon the clinical scenario. He does not have a low fecal impaction today but that is my sense. He is not a good candidate for invasive procedures like colonoscopy due to his comorbidities and warfarin use. Recent CT scanning did not demonstrate any large mass or problem like that. Medications could be having some sort of an effect. And immobility leading to poor motility of his colon could be playing a role as well. Seroquel could certainly contribute to constipation.   I have explained to the patient's daughter that we will try and approach to purchase bowels and then try a regular bowel regimen to minimize days without defecation and hopefully treat this problem effectively. The current plan is for a MiraLAX purge which is MiraLAX several doses with Dulcolax. Then he is to start daily MiraLAX and the take 1-2 Dulcolax or Senokot if no bowel movement in 2 days. If this is not effective she will call back and I will adjust versus considering investigation. His warfarin therapy and comorbidities greatly increase the risk of tolerating possible complications of a colonoscopy, tolerating the prep, and just accomplishing the procedure.  I appreciate the opportunity to care for this patient. CC: [name] Groves, DO  Subjective:   Chief Complaint: Diarrhea  HPI The patient is a very nice 82 year old African-American man here with his daughter who provides most of the history, he will have about 5 days without defecation and then have 1-2 days of mucus and watery stools. His is been going on for 6 months or more. He saw Dr. Collene Mares in 2014 for reflux and weight loss. She did not think he was an appropriate candidate for a screening colonoscopy. [facility] CT abdomen and pelvis with  contrast showed abundant retained stool hepatic steatosis and advanced lumbar spine degeneration. He was hospitalized in December 2016 with what his daughter describes as a "blockage". His discharge diagnoses at that time was ileus. Records at that time showed he was taking milk of magnesia. Prior to admission for constipation problems and apparently he stopped. He was admitted at that time with one-week history of abdominal pain and diarrhea. Was seen in the primary care clinic in April and this referral was made, I reviewed that note as well. He lives with his daughter his wife is severely demented as well. 2 sons 2 daughters. He is a retired TEFL teacher. Allergies  Allergen Reactions  . Aricept [Donepezil Hcl] Other (See Comments)    Symptomatic Bradycardia.  . Other Other (See Comments)    Shrimp gives him gout  . Shellfish-Derived Products Other (See Comments)    Causes a flare up with Gout   Outpatient Medications Prior to Visit  Medication Sig Dispense Refill  . acetaminophen (TYLENOL) 500 MG tablet Take 500 mg by mouth every 4 (four) hours as needed for mild pain or headache.    [name] amLODipine (NORVASC) 2.5 MG tablet TAKE 1 TABLET (2.5 MG TOTAL) BY MOUTH DAILY. (Patient taking differently: Take 2.5 mg by mouth every morning. ) 90 tablet 3  . atorvastatin (LIPITOR) 20 MG tablet Take 1 tablet (20 mg total) by mouth daily. 90 tablet 1  . b complex vitamins tablet Take 1 tablet by mouth every morning.     . calcium citrate-vitamin D (  CITRACAL+D) 315-200 MG-UNIT tablet TAKE 2 TABLETS BY MOUTH DAILY. 90 tablet 0  . carbidopa-levodopa (SINEMET IR) 25-100 MG tablet TAKE 1 TABLET BY MOUTH 3 (THREE) TIMES DAILY. 90 tablet 0  . fish oil-omega-3 fatty acids 1000 MG capsule Take 2 g by mouth every morning.     . furosemide (LASIX) 20 MG tablet Take 1 tablet (20 mg total) by mouth daily. (Patient taking differently: Take 20 mg by mouth every morning. )    . Multiple Vitamins-Minerals  (MULTIVITAMIN WITH MINERALS) tablet Take 1 tablet by mouth every morning.     [name] NAMENDA XR 28 MG CP24 24 hr capsule TAKE ONE CAPSULE BY MOUTH EVERY DAY 30 capsule 6  . potassium chloride (K-DUR) 10 MEQ tablet Take 1 tablet (10 mEq total) by mouth as directed. 1st dose take 2 tabs = 20 meq then decrease to 1 tab = 10 meq daily (Patient taking differently: Take 10 mEq by mouth daily. ) 90 tablet 3  . QUEtiapine (SEROQUEL) 25 MG tablet Take 1 tablet (25 mg total) by mouth at bedtime. 90 tablet 4  . vitamin C (ASCORBIC ACID) 500 MG tablet Take 500 mg by mouth every morning.     . warfarin (COUMADIN) 5 MG tablet Take 2.5-5 mg by mouth daily. 2.5 mg Monday and Thursday.  5 mg all other days.    . Rotigotine 1 MG/24HR PT24 Place 1 patch (1 mg total) onto the skin 1 day or 1 dose. 30 patch 1   No facility-administered medications prior to visit.    Past Medical History:  Diagnosis Date  . Anxiety   . Coronary artery disease, non-occlusive    with history of MI; Cath 2008 w multivessel nonobstructive CAD  . Dementia   . Depression   . GERD (gastroesophageal reflux disease)   . Hyperlipidemia   . Hypertension   . PE (pulmonary embolism)    unprovoked PE completed 6 months of warfarin, warfarin d/ced 02/12/2010, repeat PE 07/10/2010 after a long car ride and now on lifelong coumadin.  . Pituitary microadenoma ([place])    incidental finding CT 12/2009  . Prostate cancer ([place])   . Scoliosis    Past Surgical History:  Procedure Laterality Date  . LEFT HEART CATHETERIZATION WITH CORONARY ANGIOGRAM N/A 12/10/2014   Procedure: LEFT HEART CATHETERIZATION WITH CORONARY ANGIOGRAM;  Surgeon: [name], MD;  Location: [facility] CATH LAB;  Service: Cardiovascular;  Laterality: N/A;  . LOOP RECORDER IMPLANT N/A 12/16/2014   Procedure: LOOP RECORDER IMPLANT;  Surgeon: [name], MD;  Location: [facility] CATH LAB;  Service: Cardiovascular;  Laterality: N/A;  . PROSTATECTOMY     Social History   Social History  .  Marital status: Married    Spouse name: N/A  . Number of children: 4  . Years of education: master's   Occupational History  . Retired Retired   Social History Main Topics  . Smoking status: Never Smoker  . Smokeless tobacco: Never Used  . Alcohol use No  . Drug use: No  . Sexual activity: No   Other Topics Concern  . None   Social History Narrative   Lives with daughter. Wife also has severe dementia.    Drinks 1 cup of coffee a day    Family History  Problem Relation Age of Onset  . Hypertension Father     Passed away from cerebral hemorrhage at age of 92.  [name] Dementia Mother     Passed away  . Hypertension Child  4 adult children  . Cervical cancer Other   . Lung cancer Other   . Stroke Other   . Heart attack Neg Hx    Review of Systems Patient requires in home health care, he has spine and back pain. Arthritis problems. He is not able to ambulate without assistance to try to avoid falls etc. given his severe Parkinson's. He suffers from dementia as well. All other review of systems are negative. He has had problems with syncope and falls. Been to the emergency department in May and June for falls, he's had difficulty swallowing as well as altered mental status problems.  Objective:   Physical Exam @BP  138/80 (BP Location: Left Arm, Patient Position: Sitting, Cuff Size: Normal)   Pulse 68 @  General:  Elderly black man Well-developed, well-nourished and in no acute distress Eyes:  anicteric. Lungs: Clear to auscultation bilaterally. Heart:  S1S2, no rubs, murmurs, gallops. Abdomen:  soft, non-tender, no hepatosplenomegaly, hernia, or mass and BS+.  Rectal: Normal anoderm normal resting tone no mass empty rectal vault Extremities:   no edema, cyanosis or clubbing Skin   no rash. Neuro:  Masked facies, bradykinetic paucity of speech    Data Reviewed: Hospitalization records, December 2016 CT scan abdomen and pelvis with imaging [id number] consultation note of  Dr. Collene Mares Labs in the EMR from 2016 in 2017 and reviewed. As reflected in the history of present illness as well

## 2016-06-30 ENCOUNTER — Encounter (HOSPITAL_COMMUNITY): Payer: Self-pay | Admitting: Emergency Medicine

## 2016-06-30 ENCOUNTER — Emergency Department (HOSPITAL_COMMUNITY): Payer: Commercial Managed Care - HMO

## 2016-06-30 ENCOUNTER — Emergency Department (HOSPITAL_COMMUNITY)
Admission: EM | Admit: 2016-06-30 | Discharge: 2016-06-30 | Disposition: A | Discharge status: Home / Self Care | Payer: Commercial Managed Care - HMO | Attending: Emergency Medicine | Admitting: Emergency Medicine

## 2016-06-30 DIAGNOSIS — I2510 Atherosclerotic heart disease of native coronary artery without angina pectoris: Secondary | ICD-10-CM | POA: Insufficient documentation

## 2016-06-30 DIAGNOSIS — Z8546 Personal history of malignant neoplasm of prostate: Secondary | ICD-10-CM | POA: Insufficient documentation

## 2016-06-30 DIAGNOSIS — I5022 Chronic systolic (congestive) heart failure: Secondary | ICD-10-CM | POA: Diagnosis not present

## 2016-06-30 DIAGNOSIS — Y92009 Unspecified place in unspecified non-institutional (private) residence as the place of occurrence of the external cause: Secondary | ICD-10-CM | POA: Insufficient documentation

## 2016-06-30 DIAGNOSIS — W19XXXA Unspecified fall, initial encounter: Secondary | ICD-10-CM

## 2016-06-30 DIAGNOSIS — F0281 Dementia in other diseases classified elsewhere with behavioral disturbance: Secondary | ICD-10-CM | POA: Diagnosis not present

## 2016-06-30 DIAGNOSIS — M25551 Pain in right hip: Secondary | ICD-10-CM | POA: Insufficient documentation

## 2016-06-30 DIAGNOSIS — Y999 Unspecified external cause status: Secondary | ICD-10-CM | POA: Diagnosis not present

## 2016-06-30 DIAGNOSIS — W1830XA Fall on same level, unspecified, initial encounter: Secondary | ICD-10-CM | POA: Insufficient documentation

## 2016-06-30 DIAGNOSIS — G309 Alzheimer's disease, unspecified: Secondary | ICD-10-CM | POA: Diagnosis not present

## 2016-06-30 DIAGNOSIS — Y9301 Activity, walking, marching and hiking: Secondary | ICD-10-CM | POA: Diagnosis not present

## 2016-06-30 DIAGNOSIS — I110 Hypertensive heart disease with heart failure: Secondary | ICD-10-CM | POA: Diagnosis not present

## 2016-06-30 DIAGNOSIS — G20 Parkinson's disease: Secondary | ICD-10-CM | POA: Insufficient documentation

## 2016-06-30 DIAGNOSIS — T148XXA Other injury of unspecified body region, initial encounter: Secondary | ICD-10-CM | POA: Diagnosis not present

## 2016-06-30 DIAGNOSIS — S79911A Unspecified injury of right hip, initial encounter: Secondary | ICD-10-CM | POA: Diagnosis not present

## 2016-06-30 DIAGNOSIS — Z7901 Long term (current) use of anticoagulants: Secondary | ICD-10-CM | POA: Diagnosis not present

## 2016-06-30 LAB — CBC WITH DIFFERENTIAL/PLATELET
BASOS PCT: 1 %
Basophils Absolute: 0 10*3/uL (ref 0.0–0.1)
EOS ABS: 0.1 10*3/uL (ref 0.0–0.7)
Eosinophils Relative: 1 %
HEMATOCRIT: 42.8 % (ref 39.0–52.0)
HEMOGLOBIN: 13.6 g/dL (ref 13.0–17.0)
LYMPHS ABS: 1.1 10*3/uL (ref 0.7–4.0)
Lymphocytes Relative: 26 %
MCH: 29.1 pg (ref 26.0–34.0)
MCHC: 31.8 g/dL (ref 30.0–36.0)
MCV: 91.6 fL (ref 78.0–100.0)
Monocytes Absolute: 0.2 10*3/uL (ref 0.1–1.0)
Monocytes Relative: 5 %
NEUTROS ABS: 2.9 10*3/uL (ref 1.7–7.7)
NEUTROS PCT: 67 %
Platelets: 171 10*3/uL (ref 150–400)
RBC: 4.67 MIL/uL (ref 4.22–5.81)
RDW: 16.6 % — ABNORMAL HIGH (ref 11.5–15.5)
WBC: 4.3 10*3/uL (ref 4.0–10.5)

## 2016-06-30 LAB — BASIC METABOLIC PANEL
ANION GAP: 11 (ref 5–15)
BUN: 13 mg/dL (ref 6–20)
CALCIUM: 9.2 mg/dL (ref 8.9–10.3)
CHLORIDE: 103 mmol/L (ref 101–111)
CO2: 27 mmol/L (ref 22–32)
CREATININE: 1.09 mg/dL (ref 0.61–1.24)
GFR calc non Af Amer: >60 mL/min (ref >60)
Glucose, Bld: 129 mg/dL — ABNORMAL HIGH (ref 65–99)
Potassium: 3.7 mmol/L (ref 3.5–5.1)
SODIUM: 141 mmol/L (ref 135–145)

## 2016-06-30 LAB — PROTIME-INR
INR: 2.1
PROTHROMBIN TIME: 23.9 s — AB (ref 11.4–15.2)

## 2016-06-30 NOTE — ED Triage Notes (Signed)
Pt to ED via [facility] after reported fall at home.  Pt has deformity to right hip.  EMS has a sheet tied around pelvis for stabilization.  Good pedal pulse present.  EMS gave pt Fentanyl 170mcg PTA.  CBG 104

## 2016-06-30 NOTE — ED Notes (Signed)
Pt to xray at this time.

## 2016-06-30 NOTE — ED Provider Notes (Signed)
[facility] DEPT Provider Note   CSN: [id number] Arrival date & time: 06/30/16  1638     History   Chief Complaint Chief Complaint  Patient presents with  . Fall    HPI [name] [name] is a 82 y.o. male.  HPI  Pt presenting with c/o right hip pain after fall today.  Per family he was walking with nurse to the bathroom and he has parkinsons so his hand was shaking and pulled away from the nurse so he fell backwards.  She states he did not strike his head. He did c/o right hip pain.  No other areas of pain.  Pain with any movement of right hip or leg.  Denies neck or back pain.  He was given fentanyl by EMS which did help a lot with the pain and family states it made him confused.  There are no other associated systemic symptoms, there are no other alleviating or modifying factors.   Past Medical History:  Diagnosis Date  . Anxiety   . Coronary artery disease, non-occlusive    with history of MI; Cath 2008 w multivessel nonobstructive CAD  . Dementia   . Depression   . GERD (gastroesophageal reflux disease)   . Hyperlipidemia   . Hypertension   . PE (pulmonary embolism)    unprovoked PE completed 6 months of warfarin, warfarin d/ced 02/12/2010, repeat PE 07/10/2010 after a long car ride and now on lifelong coumadin.  . Pituitary microadenoma ([place])    incidental finding CT 12/2009  . Prostate cancer ([place])   . Scoliosis     Patient Active Problem List   Diagnosis Date Noted  . Statin myopathy 03/15/2016  . Ileus ([place]) 09/15/2015  . Diarrhea   . Vision changes 08/11/2015  . Parkinsonian syndrome ([place]) 08/11/2015  . Finger injury 07/28/2015  . Leukopenia 04/16/2015  . Normocytic anemia 04/16/2015  . Chronic systolic heart failure ([place]) 04/16/2015  . Falls frequently 04/16/2015  . Onychomycosis 04/15/2015  . CAD (coronary artery disease) 01/16/2015  . Faintness   . 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 Syncope 12/07/2014  . Healthcare maintenance 10/10/2014  .  Somnolence, daytime 10/01/2014  . Acute pain of right hip 02/13/2014  . Lumbosacral spondylosis without myelopathy 09/24/2013  . Bradycardia 02/20/2013  . Generalized ischemic cerebrovascular disease 02/05/2013  . Depression 01/25/2013  . Penile bleeding 01/19/2013  . Constipation 01/20/2012  . Insomnia 01/20/2012  . Alzheimer's dementia 08/17/2011  . Pulmonary embolism ([place]) 09/18/2010  . Encounter for long-term use of antiplatelets/antithrombotics 09/18/2010  . DJD (degenerative joint disease) 08/06/2010  . ADENOCARCINOMA, PROSTATE, HX OF 08/06/2010  . HLD (hyperlipidemia) 10/15/2009  . Essential hypertension, benign 10/15/2009  . GERD 10/15/2009    Past Surgical History:  Procedure Laterality Date  . LEFT HEART CATHETERIZATION WITH CORONARY ANGIOGRAM N/A 12/10/2014   Procedure: LEFT HEART CATHETERIZATION WITH CORONARY ANGIOGRAM;  Surgeon: [name], MD;  Location[facility] CATH LAB;  Service: Cardiovascular;  Laterality: N/A;  . LOOP RECORDER IMPLANT N/A 12/16/2014   Procedure: LOOP RECORDER IMPLANT;  Surgeon: [name], MD;  Location: [facility] CATH LAB;  Service: Cardiovascular;  Laterality: N/A;  . PROSTATECTOMY         Home Medications    Prior to Admission medications   Medication Sig Start Date End Date Taking? Authorizing Provider  acetaminophen (TYLENOL) 500 MG tablet Take 500 mg by mouth every 4 (four) hours as needed for mild pain or headache.   Yes Historical Provider, MD  amLODipine (NORVASC) 2.5 MG  tablet TAKE 1 TABLET (2.5 MG TOTAL) BY MOUTH DAILY. Patient taking differently: Take 2.5 mg by mouth every morning.  07/08/15  Yes [name], MD  atorvastatin (LIPITOR) 20 MG tablet Take 1 tablet (20 mg total) by mouth daily. 10/20/15  Yes Dorothy Spark, MD  b complex vitamins tablet Take 1 tablet by mouth every morning.    Yes Historical Provider, MD  calcium citrate-vitamin D (CITRACAL+D) 315-200 MG-UNIT tablet TAKE 2 TABLETS BY MOUTH DAILY. 06/07/16  Yes [name], DO  carbidopa-levodopa (SINEMET IR) 25-100 MG tablet TAKE 1 TABLET BY MOUTH 3 (THREE) TIMES DAILY. 06/14/16  Yes [name], DO  fish oil-omega-3 fatty acids 1000 MG capsule Take 2 g by mouth every morning.    Yes Historical Provider, MD  furosemide (LASIX) 20 MG tablet Take 1 tablet (20 mg total) by mouth daily. Patient taking differently: Take 20 mg by mouth every morning.  04/14/15  Yes Liliane Shi, PA-C  Multiple Vitamins-Minerals (MULTIVITAMIN WITH MINERALS) tablet Take 1 tablet by mouth every morning.    Yes Historical Provider, MD  NAMENDA XR 28 MG CP24 24 hr capsule TAKE ONE CAPSULE BY MOUTH EVERY DAY 12/18/15  Yes Garvin Fila, MD  potassium chloride (K-DUR) 10 MEQ tablet Take 1 tablet (10 mEq total) by mouth as directed. 1st dose take 2 tabs = 20 meq then decrease to 1 tab = 10 meq daily Patient taking differently: Take 10 mEq by mouth daily.  07/10/15  Yes [name], MD  QUEtiapine (SEROQUEL) 25 MG tablet Take 1 tablet (25 mg total) by mouth at bedtime. 06/21/16  Yes Garvin Fila, MD  vitamin C (ASCORBIC ACID) 500 MG tablet Take 500 mg by mouth every morning.    Yes Historical Provider, MD  warfarin (COUMADIN) 5 MG tablet Take 2.5-5 mg by mouth daily. 2.5 mg Monday and Thursday.  5 mg all other days.   Yes Historical Provider, MD  ranitidine (ZANTAC) 150 MG tablet TAKE 1 TABLET (150 MG TOTAL) BY MOUTH 2 (TWO) TIMES DAILY. 06/22/16   [name], DO    Family History Family History  Problem Relation Age of Onset  . Hypertension Father     Passed away from cerebral hemorrhage at age of 48.  [name] Dementia Mother     Passed away  . Hypertension Child     4 adult children  . Cervical cancer Other   . Lung cancer Other   . Stroke Other   . Heart attack Neg Hx     Social History Social History  Substance Use Topics  . Smoking status: Never Smoker  . Smokeless tobacco: Never Used  . Alcohol use No     Allergies   Aricept [donepezil hcl]; Other; and  Shellfish-derived products   Review of Systems Review of Systems  ROS reviewed and all otherwise negative except for mentioned in HPI   Physical Exam Updated Vital Signs BP 173/97   Pulse 101   Temp 97.7 F (36.5 C) (Oral)   Resp 15   Ht 5\' 9"  (1.753 m)   Wt 70.8 kg   SpO2 97%   BMI 23.04 kg/m  Vitals reviewed Physical Exam  Physical Examination: General appearance - alert, chronically ill appearing, and in no distress Mental status - alert, oriented to person, place, and time Eyes -no conjunctival injection no scleral icteus Mouth - mucous membranes moist, pharynx normal without lesions Neck - supple, no significant adenopathy Chest -  clear to auscultation, no wheezes, rales or rhonchi, symmetric air entry Heart - normal rate, regular rhythm, normal S1, S2, no murmurs, rubs, clicks or gallops Abdomen - soft, nontender, nondistended, no masses or organomegaly Neurological - alert, oriented, normal speech Musculoskeletal -ttp over right lateral hip, some pain with ROM, otherwise no joint tenderness, deformity or swelling Extremities - peripheral pulses normal, no pedal edema, no clubbing or cyanosis Skin - normal coloration and turgor, no rashes  ED Treatments / Results  Labs (all labs ordered are listed, but only abnormal results are displayed) Labs Reviewed  BASIC METABOLIC PANEL - Abnormal; Notable for the following:       Result Value   Glucose, Bld 129 (*)    All other components within normal limits  CBC WITH DIFFERENTIAL/PLATELET - Abnormal; Notable for the following:    RDW 16.6 (*)    All other components within normal limits  PROTIME-INR - Abnormal; Notable for the following:    Prothrombin Time 23.9 (*)    All other components within normal limits    EKG  EKG Interpretation  Date/Time:  Wednesday June 30 2016 16:52:24 EDT Ventricular Rate:  67 PR Interval:    QRS Duration: 158 QT Interval:  456 QTC Calculation: 482 R Axis:   18 Text  Interpretation:  Sinus rhythm Multiform ventricular premature complexes LAE, consider biatrial enlargement Right bundle branch block Baseline wander in lead(s) V2 No significant change since last tracing Confirmed by Canary Brim  MD, MARTHA [phone]) on 06/30/2016 8:30:14 PM       Radiology No results found.  Procedures Procedures (including critical care time)  Medications Ordered in ED Medications - No data to display   Initial Impression / Assessment and Plan / ED Course  I have reviewed the triage vital signs and the nursing notes.  Pertinent labs & imaging results that were available during my care of the patient were reviewed by me and considered in my medical decision making (see chart for details).  Clinical Course   10:03 PM pt stood and was able to bear weight somewhat.  He always walks with a lot of assistance and family feels he is at his baseline.  He does have some pain but they state he has a lot of chronic pain and it is difficult to assess.    Pt presenting after mechanical fall, pain in right hip.  Xray is negative.  Family is comfortable with discharge home.    Final Clinical Impressions(s) / ED Diagnoses   Final diagnoses:  Fall in home, initial encounter  Hip pain, right    New Prescriptions Discharge Medication List as of 06/30/2016 10:12 PM       Alfonzo Beers, MD 07/03/16 1651

## 2016-06-30 NOTE — ED Notes (Addendum)
Pt was successful in standing up, two feet on the ground, with great difficulty. Pt was extremely unsteady due to Parkinson's, per family report. Pt's son stated Pt "appeared his usual" upon standing. Pt did not scream in pain, but stated he could only place pressure on his toes. He stated his leg "ached". I did visualize both heels on the ground. Pt is unable to ambulate.

## 2016-07-03 LAB — CUP PACEART REMOTE DEVICE CHECK: MDC IDC SESS DTM: 20170920030841

## 2016-07-03 NOTE — Progress Notes (Signed)
Carelink summary report received. Battery status OK. Normal device function. No new symptom episodes, tachy episodes, brady, or pause episodes. No new AF episodes. Monthly summary reports and ROV/PRN 

## 2016-07-05 ENCOUNTER — Ambulatory Visit (INDEPENDENT_AMBULATORY_CARE_PROVIDER_SITE_OTHER): Payer: Commercial Managed Care - HMO | Admitting: Pharmacist

## 2016-07-05 DIAGNOSIS — Z7901 Long term (current) use of anticoagulants: Secondary | ICD-10-CM

## 2016-07-05 DIAGNOSIS — Z7902 Long term (current) use of antithrombotics/antiplatelets: Secondary | ICD-10-CM

## 2016-07-05 DIAGNOSIS — I2699 Other pulmonary embolism without acute cor pulmonale: Secondary | ICD-10-CM

## 2016-07-05 DIAGNOSIS — Z23 Encounter for immunization: Secondary | ICD-10-CM | POA: Diagnosis not present

## 2016-07-05 LAB — POCT INR: INR: 4.7

## 2016-07-05 NOTE — Patient Instructions (Signed)
Patient instructed to take medications as defined in the Anti-coagulation Track section of this encounter.  Patient instructed to omit today's dose and tomorrow's dose, resume October 18th.  Patient verbalized understanding of these instructions.

## 2016-07-05 NOTE — Progress Notes (Signed)
Anti-Coagulation Progress Note  [name] [name] is a 82 y.o. male who is currently on an anti-coagulation regimen.    RECENT RESULTS: Recent results are below, the most recent result is correlated with a dose of 30 mg. per week: Lab Results  Component Value Date   INR 4.7 07/05/2016   INR 2.10 06/30/2016   INR 3.4 06/14/2016    ANTI-COAG DOSE: Anticoagulation Dose Instructions as of 07/05/2016      [name] Tue Wed Thu Fri Sat   New Dose 5 mg 2.5 mg 5 mg 5 mg 2.5 mg 5 mg 2.5 mg    Description   Omit Monday and Tuesday doses and resume October 18th.      ANTICOAG SUMMARY: Anticoagulation Episode Summary    Current INR goal:   2.0-3.0  TTR:   72.6 % (5.6 y)  Next INR check:   07/19/2016  INR from last check:   4.7! (07/05/2016)  Weekly max dose:     Target end date:   Indefinite  INR check location:   [facility]  Preferred lab:     Send INR reminders to:   ANTICOAG IMP   Indications   Pulmonary embolism ([place]) [I26.99] Encounter for long-term use of antiplatelets/antithrombotics [Z79.02]       Comments:   History of multiple venous embolic episodes. Will continue to annual re-evaluate continued need for warfarin weighing risks vs. benefits.       Anticoagulation Care Providers    Provider Role Specialty Phone number   Bertha Stakes, MD  Internal Medicine [phone]      ANTICOAG TODAY: Anticoagulation Summary  As of 07/05/2016   INR goal:   2.0-3.0  TTR:     Today's INR:   4.7!  Next INR check:   07/19/2016  Target end date:   Indefinite   Indications   Pulmonary embolism ([place]) [I26.99] Encounter for long-term use of antiplatelets/antithrombotics [Z79.02]        Anticoagulation Episode Summary    INR check location:   [facility]   Preferred lab:      Send INR reminders to:   ANTICOAG IMP   Comments:   History of multiple venous embolic episodes. Will continue to annual re-evaluate continued need for warfarin weighing risks vs. benefits.      Anticoagulation Care Providers    Provider Role Specialty Phone number   Bertha Stakes, MD  Internal Medicine [phone]      PATIENT INSTRUCTIONS: There are no Patient Instructions on file for this visit.   FOLLOW-UP Return in about 2 weeks (around 07/19/2016) for F/U INR at 2:00p.  Arrie Senate, PharmD PGY-1 Pharmacy Resident Pager: [phone] 07/05/2016

## 2016-07-07 ENCOUNTER — Telehealth: Payer: Self-pay | Admitting: Internal Medicine

## 2016-07-07 NOTE — Telephone Encounter (Signed)
APT. REMINDER CALL, LMTCB °

## 2016-07-08 ENCOUNTER — Ambulatory Visit (INDEPENDENT_AMBULATORY_CARE_PROVIDER_SITE_OTHER): Payer: Commercial Managed Care - HMO | Admitting: Pharmacist

## 2016-07-08 ENCOUNTER — Other Ambulatory Visit: Payer: Self-pay

## 2016-07-08 ENCOUNTER — Telehealth: Payer: Self-pay | Admitting: *Deleted

## 2016-07-08 ENCOUNTER — Encounter (HOSPITAL_COMMUNITY): Payer: Self-pay | Admitting: Emergency Medicine

## 2016-07-08 ENCOUNTER — Telehealth: Payer: Self-pay | Admitting: Neurology

## 2016-07-08 ENCOUNTER — Inpatient Hospital Stay (HOSPITAL_COMMUNITY)
Admission: EM | Admit: 2016-07-08 | Discharge: 2016-07-13 | DRG: 690 | Disposition: A | Discharge status: Home Health | Payer: Commercial Managed Care - HMO | Attending: Internal Medicine | Admitting: Internal Medicine

## 2016-07-08 ENCOUNTER — Ambulatory Visit (INDEPENDENT_AMBULATORY_CARE_PROVIDER_SITE_OTHER): Payer: Commercial Managed Care - HMO | Admitting: Internal Medicine

## 2016-07-08 ENCOUNTER — Emergency Department (HOSPITAL_COMMUNITY): Payer: Commercial Managed Care - HMO

## 2016-07-08 ENCOUNTER — Ambulatory Visit (INDEPENDENT_AMBULATORY_CARE_PROVIDER_SITE_OTHER): Payer: Commercial Managed Care - HMO | Admitting: *Deleted

## 2016-07-08 VITALS — BP 96/50 | HR 99 | Temp 98.0°F | Ht 69.0 in | Wt 158.0 lb

## 2016-07-08 DIAGNOSIS — F329 Major depressive disorder, single episode, unspecified: Secondary | ICD-10-CM | POA: Diagnosis present

## 2016-07-08 DIAGNOSIS — I2699 Other pulmonary embolism without acute cor pulmonale: Secondary | ICD-10-CM

## 2016-07-08 DIAGNOSIS — K591 Functional diarrhea: Secondary | ICD-10-CM

## 2016-07-08 DIAGNOSIS — R069 Unspecified abnormalities of breathing: Secondary | ICD-10-CM | POA: Diagnosis not present

## 2016-07-08 DIAGNOSIS — R55 Syncope and collapse: Secondary | ICD-10-CM

## 2016-07-08 DIAGNOSIS — Z86711 Personal history of pulmonary embolism: Secondary | ICD-10-CM

## 2016-07-08 DIAGNOSIS — Z79899 Other long term (current) drug therapy: Secondary | ICD-10-CM

## 2016-07-08 DIAGNOSIS — E870 Hyperosmolality and hypernatremia: Secondary | ICD-10-CM | POA: Diagnosis not present

## 2016-07-08 DIAGNOSIS — N179 Acute kidney failure, unspecified: Secondary | ICD-10-CM | POA: Diagnosis present

## 2016-07-08 DIAGNOSIS — I440 Atrioventricular block, first degree: Secondary | ICD-10-CM | POA: Diagnosis present

## 2016-07-08 DIAGNOSIS — I2510 Atherosclerotic heart disease of native coronary artery without angina pectoris: Secondary | ICD-10-CM | POA: Diagnosis present

## 2016-07-08 DIAGNOSIS — G20 Parkinson's disease: Secondary | ICD-10-CM | POA: Diagnosis not present

## 2016-07-08 DIAGNOSIS — Z7902 Long term (current) use of antithrombotics/antiplatelets: Secondary | ICD-10-CM

## 2016-07-08 DIAGNOSIS — K5730 Diverticulosis of large intestine without perforation or abscess without bleeding: Secondary | ICD-10-CM | POA: Diagnosis present

## 2016-07-08 DIAGNOSIS — K219 Gastro-esophageal reflux disease without esophagitis: Secondary | ICD-10-CM | POA: Diagnosis present

## 2016-07-08 DIAGNOSIS — E785 Hyperlipidemia, unspecified: Secondary | ICD-10-CM | POA: Diagnosis not present

## 2016-07-08 DIAGNOSIS — R059 Cough, unspecified: Secondary | ICD-10-CM

## 2016-07-08 DIAGNOSIS — I110 Hypertensive heart disease with heart failure: Secondary | ICD-10-CM | POA: Diagnosis not present

## 2016-07-08 DIAGNOSIS — B370 Candidal stomatitis: Secondary | ICD-10-CM | POA: Diagnosis not present

## 2016-07-08 DIAGNOSIS — N10 Acute tubulo-interstitial nephritis: Secondary | ICD-10-CM | POA: Diagnosis not present

## 2016-07-08 DIAGNOSIS — Z7901 Long term (current) use of anticoagulants: Secondary | ICD-10-CM

## 2016-07-08 DIAGNOSIS — I5022 Chronic systolic (congestive) heart failure: Secondary | ICD-10-CM | POA: Diagnosis not present

## 2016-07-08 DIAGNOSIS — I252 Old myocardial infarction: Secondary | ICD-10-CM

## 2016-07-08 DIAGNOSIS — I10 Essential (primary) hypertension: Secondary | ICD-10-CM

## 2016-07-08 DIAGNOSIS — M419 Scoliosis, unspecified: Secondary | ICD-10-CM | POA: Diagnosis not present

## 2016-07-08 DIAGNOSIS — R197 Diarrhea, unspecified: Secondary | ICD-10-CM | POA: Diagnosis present

## 2016-07-08 DIAGNOSIS — G219 Secondary parkinsonism, unspecified: Secondary | ICD-10-CM

## 2016-07-08 DIAGNOSIS — R638 Other symptoms and signs concerning food and fluid intake: Secondary | ICD-10-CM | POA: Diagnosis not present

## 2016-07-08 DIAGNOSIS — K5641 Fecal impaction: Secondary | ICD-10-CM | POA: Diagnosis present

## 2016-07-08 DIAGNOSIS — R001 Bradycardia, unspecified: Secondary | ICD-10-CM | POA: Diagnosis not present

## 2016-07-08 DIAGNOSIS — Z9181 History of falling: Secondary | ICD-10-CM

## 2016-07-08 DIAGNOSIS — D352 Benign neoplasm of pituitary gland: Secondary | ICD-10-CM | POA: Diagnosis present

## 2016-07-08 DIAGNOSIS — R410 Disorientation, unspecified: Secondary | ICD-10-CM

## 2016-07-08 DIAGNOSIS — K5900 Constipation, unspecified: Secondary | ICD-10-CM

## 2016-07-08 DIAGNOSIS — Z888 Allergy status to other drugs, medicaments and biological substances status: Secondary | ICD-10-CM

## 2016-07-08 DIAGNOSIS — F0280 Dementia in other diseases classified elsewhere without behavioral disturbance: Secondary | ICD-10-CM | POA: Diagnosis present

## 2016-07-08 DIAGNOSIS — R1084 Generalized abdominal pain: Secondary | ICD-10-CM | POA: Diagnosis not present

## 2016-07-08 DIAGNOSIS — F419 Anxiety disorder, unspecified: Secondary | ICD-10-CM | POA: Diagnosis not present

## 2016-07-08 DIAGNOSIS — N12 Tubulo-interstitial nephritis, not specified as acute or chronic: Secondary | ICD-10-CM

## 2016-07-08 DIAGNOSIS — R05 Cough: Secondary | ICD-10-CM

## 2016-07-08 DIAGNOSIS — B9689 Other specified bacterial agents as the cause of diseases classified elsewhere: Secondary | ICD-10-CM | POA: Diagnosis not present

## 2016-07-08 DIAGNOSIS — F4489 Other dissociative and conversion disorders: Secondary | ICD-10-CM | POA: Diagnosis not present

## 2016-07-08 DIAGNOSIS — R402421 Glasgow coma scale score 9-12, in the field [EMT or ambulance]: Secondary | ICD-10-CM | POA: Diagnosis not present

## 2016-07-08 DIAGNOSIS — G309 Alzheimer's disease, unspecified: Secondary | ICD-10-CM | POA: Diagnosis present

## 2016-07-08 DIAGNOSIS — R0602 Shortness of breath: Secondary | ICD-10-CM | POA: Diagnosis not present

## 2016-07-08 DIAGNOSIS — I4510 Unspecified right bundle-branch block: Secondary | ICD-10-CM | POA: Diagnosis present

## 2016-07-08 DIAGNOSIS — E876 Hypokalemia: Secondary | ICD-10-CM | POA: Diagnosis not present

## 2016-07-08 DIAGNOSIS — Z91013 Allergy to seafood: Secondary | ICD-10-CM

## 2016-07-08 DIAGNOSIS — B9620 Unspecified Escherichia coli [E. coli] as the cause of diseases classified elsewhere: Secondary | ICD-10-CM | POA: Diagnosis not present

## 2016-07-08 DIAGNOSIS — R296 Repeated falls: Secondary | ICD-10-CM

## 2016-07-08 DIAGNOSIS — I6789 Other cerebrovascular disease: Secondary | ICD-10-CM | POA: Diagnosis not present

## 2016-07-08 LAB — POCT INR: INR: 4.4

## 2016-07-08 MED ORDER — ACETAMINOPHEN 650 MG RE SUPP
650.0000 mg | Freq: Once | RECTAL | Status: AC
  Administered 2016-07-09: 650 mg via RECTAL
  Filled 2016-07-08: qty 1

## 2016-07-08 MED ORDER — CA PHOSPHATE-CHOLECALCIFEROL 250-350 MG-UNIT PO CHEW: 1.0000 | CHEWABLE_TABLET | Freq: Two times a day (BID) | ORAL | 3 refills | Status: AC

## 2016-07-08 NOTE — Patient Instructions (Signed)
Patient educated about medication as defined in this encounter and verbalized understanding by repeating back instructions provided.   

## 2016-07-08 NOTE — ED Triage Notes (Signed)
Per EMS: pt coming from home with c/o progressive AMS all day. Daughter states pt is more alert than the present. Pt was given Seroquel around 1800. Pt is warm to the touch, tachycardic.

## 2016-07-08 NOTE — Progress Notes (Signed)
Anticoagulation Management [name] [name] is a 82 y.o. male who reports to the clinic for monitoring of warfarin treatment.    Indication: PEhistory Duration: indefinite  Anticoagulation Clinic Visit History: Patient does not report signs/symptoms of bleeding or thromboembolism.  Anticoagulation Episode Summary    Current INR goal:   2.0-3.0  TTR:   72.5 % (5.6 y)  Next INR check:   07/12/2016  INR from last check:   4.4! (07/08/2016)  Weekly max dose:     Target end date:   Indefinite  INR check [facility]   [facility]  Preferred lab:     Send INR reminders to:   ANTICOAG IMP   Indications   Pulmonary embolism ([place]) [I26.99] Encounter for long-term use of antiplatelets/antithrombotics [Z79.02]       Comments:   History of multiple venous embolic episodes. Will continue to annual re-evaluate continued need for warfarin weighing risks vs. benefits.       Anticoagulation Care Providers    Provider Role Specialty Phone number   Bertha Stakes, MD  Internal Medicine [phone]     ASSESSMENT Recent Results: Lab Results  Component Value Date   INR 4.4 07/08/2016   INR 4.7 07/05/2016   INR 2.10 06/30/2016    Anticoagulation Dosing: INR as of 07/08/2016 and Previous Dosing Information    INR Dt INR Goal [name] Sun Mon Tue Wed Thu Fri Sat   07/08/2016 4.4 2.0-3.0 27.5 mg 5 mg 2.5 mg 5 mg 5 mg 2.5 mg 5 mg 2.5 mg    Previous description   Omit Monday and Tuesday doses and resume October 18th.   Anticoagulation Dose Instructions as of 07/08/2016      Total Sun Mon Tue Wed Thu Fri Sat   New Dose 25 mg 5 mg 2.5 mg 5 mg 5 mg 2.5 mg 2.5 mg 2.5 mg     (5 mg x 1)  (5 mg x 0.5)  (5 mg x 1)  (5 mg x 1)  (5 mg x 0.5)  (5 mg x 0.5)  (5 mg x 0.5)                         Description   Held doses Monday and Tuesday (10/16 and 10/17), hold dose again today 07/08/16     INR today: Supratherapeutic  PLAN Hold 1 dose today, then reduce to 1/2 tablet (2.5 mg)  tomorrow and Saturday, 1 tablet (5 mg) Sunday, follow up in clinic on Monday (07/12/16).  Patient Instructions  Patient educated about medication as defined in this encounter and verbalized understanding by repeating back instructions provided.   Patient advised to contact clinic or seek medical attention if signs/symptoms of bleeding or thromboembolism occur.  Patient verbalized understanding by repeating back information and was advised to contact me if further medication-related questions arise. Patient was also provided an information handout.  Follow-up 4 days  [name],[name]  15 minutes spent face-to-face with the patient during the encounter. 50% of time spent on education. 50% of time was spent on assessment and plan.

## 2016-07-08 NOTE — ED Provider Notes (Signed)
[facility] DEPT Provider Note   CSN: [id number] Arrival date & time: 07/08/16  2215  By signing my name below, I, Soijett Blue, attest that this documentation has been prepared under the direction and in the presence of Ripley Fraise, MD. Electronically Signed: Soijett Blue, ED Scribe. 07/08/16. 11:40 PM.   History   Chief Complaint Chief Complaint  Patient presents with  . Altered Mental Status   LEVEL 5 CAVEAT: ALTERED MENTAL STATUS  HPI [name] [name] is a 82 y.o. male with a PMHx of prostate CA, dementia, parkinson's dx, HTN, who presents to the Emergency Department brought in by EMS, complaining of gradually worsening AMS onset 2 days ago. Family reports that the pt has been having issues with his breathing and heart rate and that is why they brought him into the ED for evaluation. Family notes that the pt was seen by his PCP today prior to coming to the ED tonight. Family states that the pt fell last week and he was evaluated in the ED, but family denies any falls or injuries since. Family reports that the pt lives with her and there is a CNA who comes and checks on him. Family states that the pt last had medications 5.5 hours ago. Family reports that the pt has had associated symptoms of rapid breathing, increased confusion, fast heartbeat, subjective fever, cough, and wheezing. Family denies the pt being given any medications for the relief of his symptoms. Family denies any other symptoms.     The history is provided by a relative. No language interpreter was used.  Altered Mental Status   This is a new problem. The current episode started 2 days ago. The problem has not changed since onset.His past medical history is significant for hypertension and dementia. Past medical history comments: PROSTATE ca.    Past Medical History:  Diagnosis Date  . Anxiety   . Coronary artery disease, non-occlusive    with history of MI; Cath 2008 w multivessel nonobstructive CAD  .  Dementia   . Depression   . GERD (gastroesophageal reflux disease)   . Hyperlipidemia   . Hypertension   . PE (pulmonary embolism)    unprovoked PE completed 6 months of warfarin, warfarin d/ced 02/12/2010, repeat PE 07/10/2010 after a long car ride and now on lifelong coumadin.  . Pituitary microadenoma ([place])    incidental finding CT 12/2009  . Prostate cancer ([place])   . Scoliosis     Patient Active Problem List   Diagnosis Date Noted  . Statin myopathy 03/15/2016  . Ileus ([place]) 09/15/2015  . Diarrhea   . Vision changes 08/11/2015  . Parkinsonian syndrome ([place]) 08/11/2015  . Finger injury 07/28/2015  . Leukopenia 04/16/2015  . Normocytic anemia 04/16/2015  . Chronic systolic heart failure ([place]) 04/16/2015  . Falls frequently 04/16/2015  . Onychomycosis 04/15/2015  . CAD (coronary artery disease) 01/16/2015  . Faintness   . 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 Syncope 12/07/2014  . Healthcare maintenance 10/10/2014  . Somnolence, daytime 10/01/2014  . Acute pain of right hip 02/13/2014  . Lumbosacral spondylosis without myelopathy 09/24/2013  . Bradycardia 02/20/2013  . Generalized ischemic cerebrovascular disease 02/05/2013  . Depression 01/25/2013  . Penile bleeding 01/19/2013  . Constipation 01/20/2012  . Insomnia 01/20/2012  . Alzheimer's dementia 08/17/2011  . Pulmonary embolism ([place]) 09/18/2010  . Encounter for long-term use of antiplatelets/antithrombotics 09/18/2010  . DJD (degenerative joint disease) 08/06/2010  . ADENOCARCINOMA, PROSTATE, HX OF 08/06/2010  . HLD (hyperlipidemia) 10/15/2009  .  Essential hypertension, benign 10/15/2009  . GERD 10/15/2009    Past Surgical History:  Procedure Laterality Date  . LEFT HEART CATHETERIZATION WITH CORONARY ANGIOGRAM N/A 12/10/2014   Procedure: LEFT HEART CATHETERIZATION WITH CORONARY ANGIOGRAM;  Surgeon: [name], MD;  Location: [facility] CATH LAB;  Service: Cardiovascular;  Laterality: N/A;  . LOOP RECORDER IMPLANT N/A  12/16/2014   Procedure: LOOP RECORDER IMPLANT;  Surgeon: [name], MD;  Location: [facility] CATH LAB;  Service: Cardiovascular;  Laterality: N/A;  . PROSTATECTOMY         Home Medications    Prior to Admission medications   Medication Sig Start Date End Date Taking? Authorizing Provider  acetaminophen (TYLENOL) 500 MG tablet Take 500 mg by mouth every 4 (four) hours as needed for mild pain or headache.    Historical Provider, MD  amLODipine (NORVASC) 2.5 MG tablet TAKE 1 TABLET (2.5 MG TOTAL) BY MOUTH DAILY. Patient taking differently: Take 2.5 mg by mouth every morning.  07/08/15   [name], MD  atorvastatin (LIPITOR) 20 MG tablet Take 1 tablet (20 mg total) by mouth daily. 10/20/15   Dorothy Spark, MD  b complex vitamins tablet Take 1 tablet by mouth every morning.     Historical Provider, MD  Ca Phosphate-Cholecalciferol (CALCIUM/VITAMIN D3 GUMMIES) 250-350 MG-UNIT CHEW Chew 1 Dose by mouth 2 (two) times daily. 07/08/16   [name], DO  carbidopa-levodopa (SINEMET IR) 25-100 MG tablet TAKE 1 TABLET BY MOUTH 3 (THREE) TIMES DAILY. 06/14/16   [name], DO  fish oil-omega-3 fatty acids 1000 MG capsule Take 2 g by mouth every morning.     Historical Provider, MD  furosemide (LASIX) 20 MG tablet Take 1 tablet (20 mg total) by mouth daily. Patient taking differently: Take 20 mg by mouth every morning.  04/14/15   Liliane Shi, PA-C  Multiple Vitamins-Minerals (MULTIVITAMIN WITH MINERALS) tablet Take 1 tablet by mouth every morning.     Historical Provider, MD  NAMENDA XR 28 MG CP24 24 hr capsule TAKE ONE CAPSULE BY MOUTH EVERY DAY 12/18/15   Garvin Fila, MD  potassium chloride (K-DUR) 10 MEQ tablet Take 1 tablet (10 mEq total) by mouth as directed. 1st dose take 2 tabs = 20 meq then decrease to 1 tab = 10 meq daily Patient taking differently: Take 10 mEq by mouth daily.  07/10/15   [name], MD  QUEtiapine (SEROQUEL) 25 MG tablet Take 1 tablet (25 mg total) by mouth  at bedtime. 06/21/16   Garvin Fila, MD  ranitidine (ZANTAC) 150 MG tablet TAKE 1 TABLET (150 MG TOTAL) BY MOUTH 2 (TWO) TIMES DAILY. 06/22/16   [name], DO  vitamin C (ASCORBIC ACID) 500 MG tablet Take 500 mg by mouth every morning.     Historical Provider, MD  warfarin (COUMADIN) 5 MG tablet Take 2.5-5 mg by mouth daily. 2.5 mg Monday and Thursday.  5 mg all other days.    Historical Provider, MD    Family History Family History  Problem Relation Age of Onset  . Hypertension Father     Passed away from cerebral hemorrhage at age of 31.  [name] Dementia Mother     Passed away  . Hypertension Child     4 adult children  . Cervical cancer Other   . Lung cancer Other   . Stroke Other   . Heart attack Neg Hx     Social History Social History  Substance  Use Topics  . Smoking status: Never Smoker  . Smokeless tobacco: Never Used  . Alcohol use No     Allergies   Aricept [donepezil hcl]; Other; and Shellfish-derived products   Review of Systems Review of Systems  Unable to perform ROS: Mental status change     Physical Exam Updated Vital Signs BP 117/72   Pulse 110   Temp 99 F (37.2 C) (Oral)   Resp 22   SpO2 98%   Physical Exam CONSTITUTIONAL: elderly and distress noted HEAD: Normocephalic/atraumatic EYES: EOMI/PERRL ENMT: Mucous membranes moist NECK: supple no meningeal signs SPINE/BACK:entire spine nontender CV: S1/S2 noted, no murmurs/rubs/gallops noted. tachycardic LUNGS: scattered wheezing and crackles noted bilaterally.  ABDOMEN: soft, diffuse tenderness GU:no cva tenderness NEURO: Pt is awake/alert,, moves all extremitiesx4. Pt appears confused and is slow to respond to questions but he will follow commands EXTREMITIES: pulses normal/equal, full ROM SKIN: warm, color normal PSYCH: unable to assess. Flat affect   ED Treatments / Results  DIAGNOSTIC STUDIES: Oxygen Saturation is 98% on RA, nl by my interpretation.    COORDINATION OF  CARE: 11:37 PM Discussed treatment plan with pt family at bedside which includes CXR, labs, UA, EKG, and pt family agreed to plan.   Labs (all labs ordered are listed, but only abnormal results are displayed) Labs Reviewed  CBC WITH DIFFERENTIAL/PLATELET - Abnormal; Notable for the following:       Result Value   RBC 3.80 (*)    Hemoglobin 11.1 (*)    HCT 34.4 (*)    RDW 17.3 (*)    Lymphs Abs 0.4 (*)    All other components within normal limits  COMPREHENSIVE METABOLIC PANEL - Abnormal; Notable for the following:    Glucose, Bld 119 (*)    BUN 33 (*)    Creatinine, Ser 1.70 (*)    Calcium 8.8 (*)    Total Protein 5.9 (*)    Albumin 2.9 (*)    ALT 10 (*)    Total Bilirubin 1.3 (*)    GFR calc non Af Amer 35 (*)    GFR calc Af Amer 41 (*)    All other components within normal limits  URINALYSIS, ROUTINE W REFLEX MICROSCOPIC (NOT AT [facility]) - Abnormal; Notable for the following:    Color, Urine AMBER (*)    APPearance TURBID (*)    Hgb urine dipstick LARGE (*)    Protein, ur 100 (*)    Nitrite POSITIVE (*)    Leukocytes, UA LARGE (*)    All other components within normal limits  URINE MICROSCOPIC-ADD ON - Abnormal; Notable for the following:    Squamous Epithelial / LPF 0-5 (*)    Bacteria, UA MANY (*)    All other components within normal limits  URINE CULTURE  I-STAT CG4 LACTIC ACID, ED    EKG  EKG Interpretation  Date/Time:  Thursday July 08 2016 23:56:38 EDT Ventricular Rate:  112 PR Interval:    QRS Duration: 138 QT Interval:  340 QTC Calculation: 465 R Axis:   -29 Text Interpretation:  Sinus tachycardia Ventricular premature complex Consider right atrial enlargement Right bundle branch block Confirmed by Christy Gentles  MD, Kaianna Dolezal ([id number]) on 07/09/2016 12:08:04 AM       Radiology Ct Abdomen Pelvis W Contrast  Result Date: 07/09/2016 CLINICAL DATA:  82 year old male with generalized abdominal pain and distention. EXAM: CT ABDOMEN AND PELVIS WITH CONTRAST  TECHNIQUE: Multidetector CT imaging of the abdomen and pelvis was performed using the standard  protocol following bolus administration of intravenous contrast. CONTRAST:  71mL ISOVUE-300 IOPAMIDOL (ISOVUE-300) INJECTION 61% COMPARISON:  Abdominal CT dated 09/15/2015 FINDINGS: Evaluation is limited due to respiratory motion artifact as well as streak artifact caused by patient's arms. Lower chest: An area of consolidative change at the left lung base may represent atelectasis versus infiltrate. No intra-abdominal free air or free fluid. Hepatobiliary: There is anterior interposition of the sigmoid colon with indentation of the anterior surface of the liver. The liver is otherwise unremarkable. No intrahepatic biliary ductal dilatation. The gallbladder is unremarkable as well. Pancreas: Unremarkable. No pancreatic ductal dilatation or surrounding inflammatory changes. Spleen: Normal in size without focal abnormality. Adrenals/Urinary Tract: The adrenal glands are unremarkable. 3.5 cm right renal inferior pole cyst. There is mild hyperenhancement of the superior pole of the left kidney. Correlation with urinalysis recommended to exclude UTI. There is no hydronephrosis or nephrolithiasis on either side. The visualized ureters appear unremarkable. There is apparent diffuse thickening of the bladder wall which may be partly related to underdistention. Cystitis is not excluded. Correlation with urinalysis recommended. Stomach/Bowel: There is redundancy of the sigmoid colon. The sigmoid colon is distended with air and extends upward stool in mesenteric defect into the upper abdomen and anterior to the liver. There is no evidence of mechanical obstruction or twisting at this time, however this may predispose the patient to the sigmoid volvulus. Large amount of stool noted within the colon proximal to the sigmoid. There is diffuse colonic diverticulosis without active inflammatory changes. No evidence of bowel obstruction or  active inflammation. The appendix is not visualized with certainty. No inflammatory changes identified in the right lower quadrant. Vascular/Lymphatic: There is aortoiliac atherosclerotic disease. The aorta is tortuous. The origins of the celiac axis, SMA, IMA as well as the origins of the renal arteries appear patent. No portal venous gas identified. There is no adenopathy. Reproductive: Prostatectomy. Multiple surgical clips noted within the pelvis. Other: Small fat containing umbilical hernia. Mild diffuse subcutaneous stranding. Musculoskeletal: Degenerative changes of the spine. Scoliosis. No acute fracture. IMPRESSION: Mild haziness and hypo enhancement of the upper pole of the left kidney with apparent urothelial enhancement of the left renal collecting system concerning for pyelonephritis. Correlation with urinalysis recommended. Constipation. No evidence of bowel obstruction or active inflammation. Air distended redundant sigmoid colon with all are extension to the upper abdomen. No evidence of twisting or mechanical obstruction at this time. However, this may predispose the patient to sigmoid volvulus. Colonic diverticulosis. Electronically Signed   By: Anner Crete M.D.   On: 07/09/2016 03:03   Dg Chest Portable 1 View  Result Date: 07/09/2016 CLINICAL DATA:  82 year old male with shortness of breath. EXAM: PORTABLE CHEST 1 VIEW COMPARISON:  Chest radiograph dated 09/15/2015 FINDINGS: There is shallow inspiration. Left lung base density may represent atelectasis versus infiltrate. There is no pleural effusion or pneumothorax. Crescent[place] air under the left hemidiaphragm may be within the stomach or colon. However, pneumoperitoneum is not entirely excluded. Correlation with clinical exam recommended. CT may provide better evaluation if there is clinical concern for pneumoperitoneum. Top-normal cardiac silhouette. A monitor device is noted over the left lower chest. No acute osseous pathology.  IMPRESSION: Shallow inspiration with left lung base subsegmental atelectasis versus infiltrate. Air within the stomach or colon versus less likely pneumoperitoneum. Clinical correlation is recommended. CT may provide better evaluation if there is clinical concern for pneumoperitoneum. Electronically Signed   By: Anner Crete M.D.   On: 07/09/2016 00:20    Procedures  Procedures (including critical care time)  Medications Ordered in ED Medications  iopamidol (ISOVUE-300) 61 % injection (not administered)  acetaminophen (TYLENOL) suppository 650 mg (650 mg Rectal Given 07/09/16 0121)  sodium chloride 0.9 % bolus 1,000 mL (0 mLs Intravenous Stopped 07/09/16 0303)  iopamidol (ISOVUE-300) 61 % injection 75 mL (75 mLs Intravenous Contrast Given 07/09/16 0205)  cefTRIAXone (ROCEPHIN) 1 g in dextrose 5 % 50 mL IVPB (1 g Intravenous New Bag/Given 07/09/16 0303)     Initial Impression / Assessment and Plan / ED Course  I have reviewed the triage vital signs and the nursing notes.  Pertinent labs & imaging results that were available during my care of the patient were reviewed by me and considered in my medical decision making (see chart for details).  Clinical Course    1:08 AM I reviewed CXR Radiology concerned he may have pneumoperitineum On reassessment, he has diffuse tenderness, however, unclear acuity as he is confused and will follow commands but not speak much I will proceed with CT imaging of his abd/pelvis Patient/family updated 4:16 AM Pt found to have pyelonephritis Will admit Pt stable, vitals appropriate and he is easily arousable BP 126/69 (BP Location: Right Arm)   Pulse 100   Temp 101.6 F (38.7 C) (Rectal)   Resp 18   SpO2 96%   Final Clinical Impressions(s) / ED Diagnoses   Final diagnoses:  Pyelonephritis  Delirium  AKI (acute kidney injury) ([place])    New Prescriptions New Prescriptions   No medications on file   I personally performed the services  described in this documentation, which was scribed in my presence. The recorded information has been reviewed and is accurate.        Ripley Fraise, MD 07/09/16 [phone]

## 2016-07-08 NOTE — ED Notes (Signed)
Portable chest x-ray at bedside at this time. 

## 2016-07-08 NOTE — Telephone Encounter (Signed)
RN spoke with patients daughter [name] about PCP wanting to increase his sinemet. [name] stated they could not afford the patch because it was 500.00 in cost. [name] stated her father is taking the sinemet 25-100 three times a day. [name] stating her father is struggling with his walking ability. PCP thinks it should be 4 times a day. Rn stated Dr Heber [place] is part of the [facility] and can send Dr Leonie Man a message. Rn stated Dr. Leonie Man is working in the hospital this week. Rn stated a message will be sent to [place]. Pts daughter verbalized understanding.

## 2016-07-08 NOTE — Progress Notes (Signed)
[facility] Subjective:  HPI: [name] [name] is a 82 y.o. male who presents for follow up of HTN and Parkinsonism. He is accompanied by his daughter, [name] [name], who provides the history.  Please see problem based A&P below for the status of his chronic medical problems.    Review of Systems: per daughter Review of Systems  Constitutional: Negative for chills and fever.  Respiratory: Negative for cough, sputum production, shortness of breath and wheezing.   Cardiovascular: Negative for chest pain and leg swelling.  Gastrointestinal: Positive for diarrhea. Negative for abdominal pain, nausea and vomiting.  Musculoskeletal: Positive for falls. Negative for joint pain.    Objective:  Physical Exam: Vitals:   07/08/16 0948  BP: (!) 96/50  Pulse: 99  Temp: 98 F (36.7 C)  TempSrc: Oral  SpO2: 97%  Weight: 158 lb (71.7 kg)  Height: 5\' 9"  (1.753 m)   Physical Exam  Constitutional:  Elderly male in wheelchair  Cardiovascular: Normal rate, regular rhythm and normal heart sounds.   Pulmonary/Chest: Effort normal and breath sounds normal. No respiratory distress. He has no wheezes. He has no rales.  Musculoskeletal: He exhibits no edema.  Neurological: He is alert.  Skin: Skin is warm and dry.  Psychiatric: He has a flat affect.  Nursing note and vitals reviewed.  Assessment & Plan:  Essential hypertension, benign HPI: Taking medications, denies dizziness on standing.  A: Essential HTN, controlled  P: Continue amlodipine 2.5mg  daily.  Parkinsonian syndrome [name] [name]) HPI: Daughter feels that he is getting worse, specifically he is moving around less and is more sedentary.  He is currently taking Sinemet 25-100 three times a day.  I reviewed [name] [name] note on 03/24/16 which noted his worsening gait and dementia however noted that he was having trouble tolerating Sinemet, therefore he was starting a new medication [name] [name].  However his daughter reports this  cost 500 dollars a month and could not afford it.  She reports that the intolerance to Sinmet was his diarrhea, which has now been diagnosed as overload diarrhea related to a high impaction.  She did report that [name] [name] encouraged her to call back if she had problems but that she had not done so before. Additionally since I saw him last he has had multiple falls including an ER visit 1 week ago.  Daughter feels his balance and movements is getting worse.  He did well with Neurorehab last year and she feels that he may do better if he goes back.  A: Parkinson's syndrome.  Frequent falls  P: Refer to Neuro-rehab Daughter will call [name] [name] office and arrange follow up. For now will continue Sinmet 25-100 TID  Encounter for long-term use of antiplatelets/antithrombotics Checked INR today, supratheraputic.  I agree with [name] [name] management recommendations.  HLD (hyperlipidemia) Patient has history of statin myopathy with atorvastatin.  Discussed with daughter again.  Will plan to check lipid profile to reassess risk as he is now off medication.  Unspecified abnormalities of breathing HPI: Daughter expresses some concern about [name] [name]'s breathing.  She notes a few episdoes over the last few days where he will breathe abnormally. She says this only happens for a few seconds, he denies any complaints during the episdoes.  During our visit he did have once of these episdoes where he took 3 short breaths in rapid successsion.  He deneid any chest pain, SOB, HA, or abdominal pain during this episode.  His lungs were clear on exam.  A: Abonormal breathing  P: I discussed with the daughter that I was not sure why he was breathing in this manner.  His lungs are clear and it may be provoked by pain or anxiety.  I asked her to continue to monitor this. Later in the day she called back our nursing staff, she had forgotten what we had talked about.  I asked that if he has new complaints or worsening please go  to the ER.  It appears now that she did take him to the ER and he was found to have pyelonephritis which in retrospect was likely causing him discomfort.  Diarrhea HPI: He continues to have diarrhea, he was seen by [name] [name] on 06/22/16 for this.  I have reviewed that note, he feels that [name] [name] has a high impaction and overflow diarrrhea.  He recommended a Miralax purge.  [name] [name] to be avalable to assist when she does it.  A: Diarrhea due to Constipation/High impaction  P:  I discussed need to complete miralax purge. After reviewing the CT obtain in the ER after the visit it does confirm that constipation is still present.  I have talked with [name] Daryll Drown, taking care in the hospital, when he is stable it may be possible to treat the constipation in the hospital where additional assistance is available.   Medications Ordered Meds ordered this encounter  Medications  . Ca Phosphate-Cholecalciferol (CALCIUM/VITAMIN D3 GUMMIES) 250-350 MG-UNIT CHEW    Sig: Chew 1 Dose by mouth 2 (two) times daily.    Dispense:  180 tablet    Refill:  3   Other Orders Orders Placed This Encounter  Procedures  . CMP14 + Anion Gap  . Lipid Profile  . Ambulatory Referral to Neuro Rehab    Referral Priority:   Routine    Referral Type:   Consultation    Number of Visits Requested:   1  . POCT INR   Follow Up: Return in about 3 months (around 10/08/2016), or if symptoms worsen or fail to improve.

## 2016-07-08 NOTE — Telephone Encounter (Signed)
Pt's daughter calls and states she forgot to mention to dr Heber Kress about pt's resp rate increasing at times for 2-3 days, she states he denies chest pain or discomfort, happens sporadically, has been happening since they got home and she is concerned. Spoke w/ dr Heber Anon Raices, he noticed in office and spoke to daughter about it, seems anxiety driven possibly, if pt has chest pain, weakness, diaphoresis, h/a when this occurs to ED or 911. Reassure daughter Damaris Schooner w/ daughter and she states she forgot they talked about it and she will try to find something that makes pt calm- gospel music, sisters's dog etc and will monitor for any other changes. She states she feels better at this time and to tell dr Heber [place] thank you

## 2016-07-08 NOTE — Telephone Encounter (Signed)
Pt's daughter called toadvise Dr Heber [place] PCP wants to increase carbidopa-levodopa (SINEMET IR) 25-100 MG tablet bc the pt is having difficulty walking. She also advised she was not able to start pt on the patch as it was too expensive (that was discussed 2-3 mths ago)

## 2016-07-08 NOTE — Patient Instructions (Signed)
I am going to refer you back to Neuro rehab.

## 2016-07-08 NOTE — ED Notes (Signed)
Dr. Wickline at bedside at this time.  

## 2016-07-08 NOTE — ED Notes (Signed)
Daughter reports patient seen at MD's office today. Heartrate increased at that time as well, and she reports fever (but did not check temperature at home). She reports increased weakness over past 2 days.

## 2016-07-09 ENCOUNTER — Emergency Department (HOSPITAL_COMMUNITY): Payer: Commercial Managed Care - HMO

## 2016-07-09 DIAGNOSIS — I440 Atrioventricular block, first degree: Secondary | ICD-10-CM | POA: Diagnosis present

## 2016-07-09 DIAGNOSIS — E785 Hyperlipidemia, unspecified: Secondary | ICD-10-CM | POA: Diagnosis present

## 2016-07-09 DIAGNOSIS — N179 Acute kidney failure, unspecified: Secondary | ICD-10-CM | POA: Diagnosis present

## 2016-07-09 DIAGNOSIS — M419 Scoliosis, unspecified: Secondary | ICD-10-CM | POA: Diagnosis present

## 2016-07-09 DIAGNOSIS — R410 Disorientation, unspecified: Secondary | ICD-10-CM

## 2016-07-09 DIAGNOSIS — K219 Gastro-esophageal reflux disease without esophagitis: Secondary | ICD-10-CM | POA: Diagnosis present

## 2016-07-09 DIAGNOSIS — Z91013 Allergy to seafood: Secondary | ICD-10-CM

## 2016-07-09 DIAGNOSIS — D352 Benign neoplasm of pituitary gland: Secondary | ICD-10-CM | POA: Diagnosis present

## 2016-07-09 DIAGNOSIS — I4510 Unspecified right bundle-branch block: Secondary | ICD-10-CM | POA: Diagnosis present

## 2016-07-09 DIAGNOSIS — G309 Alzheimer's disease, unspecified: Secondary | ICD-10-CM | POA: Diagnosis present

## 2016-07-09 DIAGNOSIS — B9689 Other specified bacterial agents as the cause of diseases classified elsewhere: Secondary | ICD-10-CM | POA: Diagnosis not present

## 2016-07-09 DIAGNOSIS — K5730 Diverticulosis of large intestine without perforation or abscess without bleeding: Secondary | ICD-10-CM | POA: Diagnosis present

## 2016-07-09 DIAGNOSIS — I5022 Chronic systolic (congestive) heart failure: Secondary | ICD-10-CM | POA: Diagnosis present

## 2016-07-09 DIAGNOSIS — E876 Hypokalemia: Secondary | ICD-10-CM | POA: Diagnosis not present

## 2016-07-09 DIAGNOSIS — K5641 Fecal impaction: Secondary | ICD-10-CM | POA: Diagnosis present

## 2016-07-09 DIAGNOSIS — I110 Hypertensive heart disease with heart failure: Secondary | ICD-10-CM | POA: Diagnosis present

## 2016-07-09 DIAGNOSIS — F0280 Dementia in other diseases classified elsewhere without behavioral disturbance: Secondary | ICD-10-CM | POA: Diagnosis present

## 2016-07-09 DIAGNOSIS — Z888 Allergy status to other drugs, medicaments and biological substances status: Secondary | ICD-10-CM

## 2016-07-09 DIAGNOSIS — B370 Candidal stomatitis: Secondary | ICD-10-CM | POA: Diagnosis not present

## 2016-07-09 DIAGNOSIS — F419 Anxiety disorder, unspecified: Secondary | ICD-10-CM | POA: Diagnosis present

## 2016-07-09 DIAGNOSIS — N10 Acute tubulo-interstitial nephritis: Secondary | ICD-10-CM | POA: Diagnosis not present

## 2016-07-09 DIAGNOSIS — I252 Old myocardial infarction: Secondary | ICD-10-CM | POA: Diagnosis not present

## 2016-07-09 DIAGNOSIS — N12 Tubulo-interstitial nephritis, not specified as acute or chronic: Secondary | ICD-10-CM | POA: Diagnosis present

## 2016-07-09 DIAGNOSIS — R001 Bradycardia, unspecified: Secondary | ICD-10-CM | POA: Diagnosis present

## 2016-07-09 DIAGNOSIS — E870 Hyperosmolality and hypernatremia: Secondary | ICD-10-CM | POA: Diagnosis not present

## 2016-07-09 DIAGNOSIS — B9620 Unspecified Escherichia coli [E. coli] as the cause of diseases classified elsewhere: Secondary | ICD-10-CM | POA: Diagnosis not present

## 2016-07-09 DIAGNOSIS — R638 Other symptoms and signs concerning food and fluid intake: Secondary | ICD-10-CM | POA: Diagnosis not present

## 2016-07-09 DIAGNOSIS — F329 Major depressive disorder, single episode, unspecified: Secondary | ICD-10-CM | POA: Diagnosis present

## 2016-07-09 DIAGNOSIS — I2510 Atherosclerotic heart disease of native coronary artery without angina pectoris: Secondary | ICD-10-CM | POA: Diagnosis present

## 2016-07-09 DIAGNOSIS — G20 Parkinson's disease: Secondary | ICD-10-CM | POA: Diagnosis present

## 2016-07-09 DIAGNOSIS — R197 Diarrhea, unspecified: Secondary | ICD-10-CM | POA: Diagnosis present

## 2016-07-09 DIAGNOSIS — R069 Unspecified abnormalities of breathing: Secondary | ICD-10-CM | POA: Insufficient documentation

## 2016-07-09 LAB — CBC WITH DIFFERENTIAL/PLATELET
BASOS ABS: 0 10*3/uL (ref 0.0–0.1)
Basophils Relative: 0 %
Eosinophils Absolute: 0 10*3/uL (ref 0.0–0.7)
Eosinophils Relative: 0 %
HEMATOCRIT: 34.4 % — AB (ref 39.0–52.0)
HEMOGLOBIN: 11.1 g/dL — AB (ref 13.0–17.0)
LYMPHS PCT: 5 %
Lymphs Abs: 0.4 10*3/uL — ABNORMAL LOW (ref 0.7–4.0)
MCH: 29.2 pg (ref 26.0–34.0)
MCHC: 32.3 g/dL (ref 30.0–36.0)
MCV: 90.5 fL (ref 78.0–100.0)
MONO ABS: 0.8 10*3/uL (ref 0.1–1.0)
Monocytes Relative: 9 %
NEUTROS ABS: 7.3 10*3/uL (ref 1.7–7.7)
NEUTROS PCT: 86 %
Platelets: 155 10*3/uL (ref 150–400)
RBC: 3.8 MIL/uL — AB (ref 4.22–5.81)
RDW: 17.3 % — ABNORMAL HIGH (ref 11.5–15.5)
WBC: 8.5 10*3/uL (ref 4.0–10.5)

## 2016-07-09 LAB — URINALYSIS, ROUTINE W REFLEX MICROSCOPIC
BILIRUBIN URINE: NEGATIVE
GLUCOSE, UA: NEGATIVE mg/dL
Ketones, ur: NEGATIVE mg/dL
Nitrite: POSITIVE — AB
PROTEIN: 100 mg/dL — AB
SPECIFIC GRAVITY, URINE: 1.014 (ref 1.005–1.030)
pH: 5.5 (ref 5.0–8.0)

## 2016-07-09 LAB — CMP14 + ANION GAP
A/G RATIO: 1.2 (ref 1.2–2.2)
ALT: 20 IU/L (ref 0–44)
AST: 37 IU/L (ref 0–40)
Albumin: 3.6 g/dL (ref 3.5–4.7)
Alkaline Phosphatase: 53 IU/L (ref 39–117)
Anion Gap: 20 mmol/L — ABNORMAL HIGH (ref 10.0–18.0)
BUN/Creatinine Ratio: 19 (ref 10–24)
BUN: 29 mg/dL — ABNORMAL HIGH (ref 8–27)
Bilirubin Total: 1.2 mg/dL (ref 0.0–1.2)
CALCIUM: 9.1 mg/dL (ref 8.6–10.2)
CO2: 23 mmol/L (ref 18–29)
Chloride: 103 mmol/L (ref 96–106)
Creatinine, Ser: 1.52 mg/dL — ABNORMAL HIGH (ref 0.76–1.27)
GFR calc Af Amer: 48 mL/min/{1.73_m2} — ABNORMAL LOW (ref >59)
GFR, EST NON AFRICAN AMERICAN: 42 mL/min/{1.73_m2} — AB (ref >59)
GLOBULIN, TOTAL: 2.9 g/dL (ref 1.5–4.5)
Glucose: 89 mg/dL (ref 65–99)
POTASSIUM: 4.4 mmol/L (ref 3.5–5.2)
SODIUM: 146 mmol/L — AB (ref 134–144)
Total Protein: 6.5 g/dL (ref 6.0–8.5)

## 2016-07-09 LAB — COMPREHENSIVE METABOLIC PANEL
ALT: 10 U/L — AB (ref 17–63)
AST: 37 U/L (ref 15–41)
Albumin: 2.9 g/dL — ABNORMAL LOW (ref 3.5–5.0)
Alkaline Phosphatase: 49 U/L (ref 38–126)
Anion gap: 8 (ref 5–15)
BILIRUBIN TOTAL: 1.3 mg/dL — AB (ref 0.3–1.2)
BUN: 33 mg/dL — AB (ref 6–20)
CO2: 25 mmol/L (ref 22–32)
CREATININE: 1.7 mg/dL — AB (ref 0.61–1.24)
Calcium: 8.8 mg/dL — ABNORMAL LOW (ref 8.9–10.3)
Chloride: 108 mmol/L (ref 101–111)
GFR calc Af Amer: 41 mL/min — ABNORMAL LOW (ref >60)
GFR, EST NON AFRICAN AMERICAN: 35 mL/min — AB (ref >60)
GLUCOSE: 119 mg/dL — AB (ref 65–99)
Potassium: 3.7 mmol/L (ref 3.5–5.1)
Sodium: 141 mmol/L (ref 135–145)
TOTAL PROTEIN: 5.9 g/dL — AB (ref 6.5–8.1)

## 2016-07-09 LAB — LIPID PANEL
CHOL/HDL RATIO: 2.2 ratio (ref 0.0–5.0)
Cholesterol, Total: 183 mg/dL (ref 100–199)
HDL: 82 mg/dL (ref >39)
LDL Calculated: 82 mg/dL (ref 0–99)
TRIGLYCERIDES: 95 mg/dL (ref 0–149)
VLDL Cholesterol Cal: 19 mg/dL (ref 5–40)

## 2016-07-09 LAB — BASIC METABOLIC PANEL
Anion gap: 10 (ref 5–15)
BUN: 31 mg/dL — AB (ref 6–20)
CHLORIDE: 111 mmol/L (ref 101–111)
CO2: 23 mmol/L (ref 22–32)
Calcium: 8.2 mg/dL — ABNORMAL LOW (ref 8.9–10.3)
Creatinine, Ser: 1.52 mg/dL — ABNORMAL HIGH (ref 0.61–1.24)
GFR calc Af Amer: 47 mL/min — ABNORMAL LOW (ref >60)
GFR calc non Af Amer: 41 mL/min — ABNORMAL LOW (ref >60)
GLUCOSE: 87 mg/dL (ref 65–99)
POTASSIUM: 3.6 mmol/L (ref 3.5–5.1)
Sodium: 144 mmol/L (ref 135–145)

## 2016-07-09 LAB — PROTIME-INR
INR: 2.81
Prothrombin Time: 30.1 seconds — ABNORMAL HIGH (ref 11.4–15.2)

## 2016-07-09 LAB — URINE MICROSCOPIC-ADD ON

## 2016-07-09 LAB — MRSA PCR SCREENING: MRSA by PCR: NEGATIVE

## 2016-07-09 LAB — I-STAT CG4 LACTIC ACID, ED: LACTIC ACID, VENOUS: 1.4 mmol/L (ref 0.5–1.9)

## 2016-07-09 MED ORDER — CARBIDOPA-LEVODOPA 25-100 MG PO TABS
1.0000 | ORAL_TABLET | Freq: Three times a day (TID) | ORAL | Status: DC
  Administered 2016-07-09 – 2016-07-13 (×14): 1 via ORAL
  Filled 2016-07-09 (×15): qty 1

## 2016-07-09 MED ORDER — DEXTROSE 5 % IV SOLN
1.0000 g | INTRAVENOUS | Status: DC
  Administered 2016-07-10 – 2016-07-11 (×2): 1 g via INTRAVENOUS
  Filled 2016-07-09 (×2): qty 10

## 2016-07-09 MED ORDER — WARFARIN SODIUM 2.5 MG PO TABS
2.5000 mg | ORAL_TABLET | Freq: Once | ORAL | Status: AC
Start: 2016-07-09 — End: 2016-07-09
  Administered 2016-07-09: 2.5 mg via ORAL
  Filled 2016-07-09: qty 1

## 2016-07-09 MED ORDER — IOPAMIDOL (ISOVUE-300) INJECTION 61%
INTRAVENOUS | Status: AC
  Filled 2016-07-09: qty 75

## 2016-07-09 MED ORDER — SODIUM CHLORIDE 0.9 % IV SOLN
INTRAVENOUS | Status: AC
  Administered 2016-07-09: 06:00:00 via INTRAVENOUS

## 2016-07-09 MED ORDER — ACETAMINOPHEN 325 MG PO TABS
650.0000 mg | ORAL_TABLET | Freq: Four times a day (QID) | ORAL | Status: DC | PRN
  Administered 2016-07-11: 650 mg via ORAL
  Filled 2016-07-09 (×2): qty 2

## 2016-07-09 MED ORDER — IOPAMIDOL (ISOVUE-300) INJECTION 61%
INTRAVENOUS | Status: AC
  Filled 2016-07-09: qty 100

## 2016-07-09 MED ORDER — MEMANTINE HCL ER 28 MG PO CP24
28.0000 mg | ORAL_CAPSULE | Freq: Every day | ORAL | Status: DC
  Administered 2016-07-09 – 2016-07-13 (×5): 28 mg via ORAL
  Filled 2016-07-09 (×5): qty 1

## 2016-07-09 MED ORDER — ACETAMINOPHEN 650 MG RE SUPP
650.0000 mg | Freq: Four times a day (QID) | RECTAL | Status: DC | PRN
  Administered 2016-07-09: 650 mg via RECTAL
  Filled 2016-07-09: qty 1

## 2016-07-09 MED ORDER — IOPAMIDOL (ISOVUE-300) INJECTION 61%
75.0000 mL | Freq: Once | INTRAVENOUS | Status: AC | PRN
  Administered 2016-07-09: 75 mL via INTRAVENOUS

## 2016-07-09 MED ORDER — QUETIAPINE FUMARATE 25 MG PO TABS
25.0000 mg | ORAL_TABLET | Freq: Every day | ORAL | Status: DC
  Administered 2016-07-09 – 2016-07-12 (×4): 25 mg via ORAL
  Filled 2016-07-09 (×5): qty 1

## 2016-07-09 MED ORDER — WARFARIN - PHARMACIST DOSING INPATIENT
Freq: Every day | Status: DC
Start: 2016-07-09 — End: 2016-07-13
  Administered 2016-07-12: 18:00:00

## 2016-07-09 MED ORDER — SODIUM CHLORIDE 0.9 % IV BOLUS (SEPSIS)
1000.0000 mL | Freq: Once | INTRAVENOUS | Status: AC
  Administered 2016-07-09: 1000 mL via INTRAVENOUS

## 2016-07-09 MED ORDER — DEXTROSE 5 % IV SOLN
1.0000 g | Freq: Once | INTRAVENOUS | Status: AC
  Administered 2016-07-09: 1 g via INTRAVENOUS
  Filled 2016-07-09: qty 10

## 2016-07-09 NOTE — Progress Notes (Signed)
Carelink Summary Report / Loop Recorder 

## 2016-07-09 NOTE — Assessment & Plan Note (Signed)
HPI: Taking medications, denies dizziness on standing.  A: Essential HTN, controlled  P: Continue amlodipine 2.5mg  daily.

## 2016-07-09 NOTE — ED Notes (Signed)
Patient transported to CT 

## 2016-07-09 NOTE — Progress Notes (Signed)
[facility] for Coumadin Indication: recurrent PE  Allergies  Allergen Reactions  . Aricept [Donepezil Hcl] Other (See Comments)    Symptomatic Bradycardia.  . Other Other (See Comments)    Shrimp gives him gout  . Shellfish-Derived Products Other (See Comments)    Causes a flare up with Gout    Patient Measurements:    Vital Signs: Temp: 97.4 F (36.3 C) (10/20 0709) Temp Source: Oral (10/20 0709) BP: 116/78 (10/20 0539) Pulse Rate: 95 (10/20 0539)  Labs:  Recent Labs  07/08/16 1035 07/08/16 1044 07/09/16 0030 07/09/16 0649 07/09/16 0804  HGB  --   --  11.1*  --   --   HCT  --   --  34.4*  --   --   PLT  --   --  155  --   --   LABPROT  --   --   --  30.1*  --   INR  --  4.4  --  2.81  --   CREATININE 1.52*  --  1.70*  --  1.52*    Estimated Creatinine Clearance: 36.8 mL/min (by C-G formula based on SCr of 1.52 mg/dL (H)).   Medical History: Past Medical History:  Diagnosis Date  . Anxiety   . Coronary artery disease, non-occlusive    with history of MI; Cath 2008 w multivessel nonobstructive CAD  . Dementia   . Depression   . GERD (gastroesophageal reflux disease)   . Hyperlipidemia   . Hypertension   . PE (pulmonary embolism)    unprovoked PE completed 6 months of warfarin, warfarin d/ced 02/12/2010, repeat PE 07/10/2010 after a long car ride and now on lifelong coumadin.  . Pituitary microadenoma ([place])    incidental finding CT 12/2009  . Prostate cancer ([place])   . Scoliosis     Medications:  Prescriptions Prior to Admission  Medication Sig Dispense Refill Last Dose  . acetaminophen (TYLENOL) 500 MG tablet Take 500 mg by mouth every 4 (four) hours as needed for mild pain or headache.   07/08/2016 at Unknown time  . amLODipine (NORVASC) 2.5 MG tablet TAKE 1 TABLET (2.5 MG TOTAL) BY MOUTH DAILY. (Patient taking differently: Take 2.5 mg by mouth daily. ) 90 tablet 3 07/08/2016 at Unknown time  . atorvastatin  (LIPITOR) 20 MG tablet Take 1 tablet (20 mg total) by mouth daily. 90 tablet 1 07/08/2016 at Unknown time  . b complex vitamins tablet Take 1 tablet by mouth every morning.    07/08/2016 at Unknown time  . Ca Phosphate-Cholecalciferol (CALCIUM/VITAMIN D3 GUMMIES) 250-350 MG-UNIT CHEW Chew 1 Dose by mouth 2 (two) times daily. 180 tablet 3 07/08/2016 at Unknown time  . carbidopa-levodopa (SINEMET IR) 25-100 MG tablet TAKE 1 TABLET BY MOUTH 3 (THREE) TIMES DAILY. 90 tablet 0 07/08/2016 at Unknown time  . fish oil-omega-3 fatty acids 1000 MG capsule Take 2 g by mouth every morning.    07/08/2016 at Unknown time  . furosemide (LASIX) 20 MG tablet Take 1 tablet (20 mg total) by mouth daily.   07/08/2016 at Unknown time  . Multiple Vitamins-Minerals (MULTIVITAMIN WITH MINERALS) tablet Take 1 tablet by mouth every morning.    07/08/2016 at Unknown time  . NAMENDA XR 28 MG CP24 24 hr capsule TAKE ONE CAPSULE BY MOUTH EVERY DAY 30 capsule 6 07/08/2016 at Unknown time  . potassium chloride (K-DUR) 10 MEQ tablet Take 1 tablet (10 mEq total) by mouth as directed. 1st dose  take 2 tabs = 20 meq then decrease to 1 tab = 10 meq daily (Patient taking differently: Take 10 mEq by mouth daily. ) 90 tablet 3 07/08/2016 at Unknown time  . QUEtiapine (SEROQUEL) 25 MG tablet Take 1 tablet (25 mg total) by mouth at bedtime. 90 tablet 4 07/08/2016 at Unknown time  . ranitidine (ZANTAC) 150 MG tablet TAKE 1 TABLET (150 MG TOTAL) BY MOUTH 2 (TWO) TIMES DAILY. 60 tablet 0 07/08/2016 at Unknown time  . vitamin C (ASCORBIC ACID) 500 MG tablet Take 500 mg by mouth every morning.    07/08/2016 at Unknown time  . warfarin (COUMADIN) 5 MG tablet Take 2.5-5 mg by mouth daily. 2.5 mg Monday and Thursday.  5 mg all other days.   07/08/2016 at 1800    Assessment: 82 y.o. F presents with pyelonephritis. Pt on coumadin PTA for h/o recurrent PE. INR 4.4 on 10/19 at IM clinic. INR 4.4 on admission and 10/19 evening dose was held. INR 2.81  today and CBC stable.   PTA dose: : 5 mg on Sunday, Tuesday and Wednesday and 2.5 mg AOD's (recent dose change)   Goal of Therapy:  INR 2-3 Monitor platelets by anticoagulation protocol: Yes   Plan:  1. Warfarin 2.5 mg x 1 this evening  2. Daily INR   Vincenza Hews, PharmD, BCPS 07/09/2016, 11:01 AM Pager: [phone]

## 2016-07-09 NOTE — Telephone Encounter (Signed)
Ok to increase sinemet to four times daily if he can tolerate it

## 2016-07-09 NOTE — Assessment & Plan Note (Signed)
Checked INR today, supratheraputic.  I agree with Dr Julianne Rice management recommendations.

## 2016-07-09 NOTE — Assessment & Plan Note (Signed)
Patient has history of statin myopathy with atorvastatin.  Discussed with daughter again.  Will plan to check lipid profile to reassess risk as he is now off medication.

## 2016-07-09 NOTE — Progress Notes (Signed)
ANTICOAGULATION CONSULT NOTE - Initial Consult  Pharmacy Consult for Coumadin Indication: recurrent PE  Allergies  Allergen Reactions  . Aricept [Donepezil Hcl] Other (See Comments)    Symptomatic Bradycardia.  . Other Other (See Comments)    Shrimp gives him gout  . Shellfish-Derived Products Other (See Comments)    Causes a flare up with Gout    Patient Measurements:    Vital Signs: Temp: 100.7 F (38.2 C) (10/20 0540) Temp Source: Oral (10/20 0540) BP: 116/78 (10/20 0539) Pulse Rate: 95 (10/20 0539)  Labs:  Recent Labs  07/08/16 1035 07/08/16 1044 07/09/16 0030  HGB  --   --  11.1*  HCT  --   --  34.4*  PLT  --   --  155  INR  --  4.4  --   CREATININE 1.52*  --  1.70*    Estimated Creatinine Clearance: 32.9 mL/min (by C-G formula based on SCr of 1.7 mg/dL (H)).   Medical History: Past Medical History:  Diagnosis Date  . Anxiety   . Coronary artery disease, non-occlusive    with history of MI; Cath 2008 w multivessel nonobstructive CAD  . Dementia   . Depression   . GERD (gastroesophageal reflux disease)   . Hyperlipidemia   . Hypertension   . PE (pulmonary embolism)    unprovoked PE completed 6 months of warfarin, warfarin d/ced 02/12/2010, repeat PE 07/10/2010 after a long car ride and now on lifelong coumadin.  . Pituitary microadenoma ([place])    incidental finding CT 12/2009  . Prostate cancer ([place])   . Scoliosis     Medications:  Prescriptions Prior to Admission  Medication Sig Dispense Refill Last Dose  . acetaminophen (TYLENOL) 500 MG tablet Take 500 mg by mouth every 4 (four) hours as needed for mild pain or headache.   07/08/2016 at Unknown time  . amLODipine (NORVASC) 2.5 MG tablet TAKE 1 TABLET (2.5 MG TOTAL) BY MOUTH DAILY. (Patient taking differently: Take 2.5 mg by mouth daily. ) 90 tablet 3 07/08/2016 at Unknown time  . atorvastatin (LIPITOR) 20 MG tablet Take 1 tablet (20 mg total) by mouth daily. 90 tablet 1 07/08/2016 at Unknown time   . b complex vitamins tablet Take 1 tablet by mouth every morning.    07/08/2016 at Unknown time  . Ca Phosphate-Cholecalciferol (CALCIUM/VITAMIN D3 GUMMIES) 250-350 MG-UNIT CHEW Chew 1 Dose by mouth 2 (two) times daily. 180 tablet 3 07/08/2016 at Unknown time  . carbidopa-levodopa (SINEMET IR) 25-100 MG tablet TAKE 1 TABLET BY MOUTH 3 (THREE) TIMES DAILY. 90 tablet 0 07/08/2016 at Unknown time  . fish oil-omega-3 fatty acids 1000 MG capsule Take 2 g by mouth every morning.    07/08/2016 at Unknown time  . furosemide (LASIX) 20 MG tablet Take 1 tablet (20 mg total) by mouth daily.   07/08/2016 at Unknown time  . Multiple Vitamins-Minerals (MULTIVITAMIN WITH MINERALS) tablet Take 1 tablet by mouth every morning.    07/08/2016 at Unknown time  . NAMENDA XR 28 MG CP24 24 hr capsule TAKE ONE CAPSULE BY MOUTH EVERY DAY 30 capsule 6 07/08/2016 at Unknown time  . potassium chloride (K-DUR) 10 MEQ tablet Take 1 tablet (10 mEq total) by mouth as directed. 1st dose take 2 tabs = 20 meq then decrease to 1 tab = 10 meq daily (Patient taking differently: Take 10 mEq by mouth daily. ) 90 tablet 3 07/08/2016 at Unknown time  . QUEtiapine (SEROQUEL) 25 MG tablet Take 1 tablet (25  mg total) by mouth at bedtime. 90 tablet 4 07/08/2016 at Unknown time  . ranitidine (ZANTAC) 150 MG tablet TAKE 1 TABLET (150 MG TOTAL) BY MOUTH 2 (TWO) TIMES DAILY. 60 tablet 0 07/08/2016 at Unknown time  . vitamin C (ASCORBIC ACID) 500 MG tablet Take 500 mg by mouth every morning.    07/08/2016 at Unknown time  . warfarin (COUMADIN) 5 MG tablet Take 2.5-5 mg by mouth daily. 2.5 mg Monday and Thursday.  5 mg all other days.   07/08/2016 at 1800    Assessment: 82 y.o. F presents with pyelonephritis. Pt on coumadin PTA for h/o recurrent PE. INR 4.4 yesterday at IM clinic. Today's INR pending. Home dose: 5mg  daily except for 2.5mg  Mon and Thur  Goal of Therapy:  INR 2-3 Monitor platelets by anticoagulation protocol: Yes   Plan:  Daily  INR  Sherlon Handing, PharmD, BCPS Clinical pharmacist, pager [phone] 07/09/2016,6:28 AM

## 2016-07-09 NOTE — Assessment & Plan Note (Signed)
HPI: He continues to have diarrhea, he was seen by Dr Carlean Purl on 06/22/16 for this.  I have reviewed that note, he feels that Calletano has a high impaction and overflow diarrrhea.  He recommended a Miralax purge.  [name] has yet to do this because she wants to wait for her brothers to be avalable to assist when she does it.  A: Diarrhea due to Constipation/High impaction  P:  I discussed need to complete miralax purge. After reviewing the CT obtain in the ER after the visit it does confirm that constipation is still present.  I have talked with Dr Daryll Drown, taking care in the hospital, when he is stable it may be possible to treat the constipation in the hospital where additional assistance is available.

## 2016-07-09 NOTE — Progress Notes (Signed)
Pt admitted to the unit from ED per stretcher accompanied by two nurse tech, pt was  Shaking and was  in a state of panic on arrival to the unit, according to the tech and the ED nurse said lab draws his blood whiles pt was asleep and woke  up in a panic state, pt in the state of confusion which is the main complain that his daughter brought him to the hospital, temp high gave pt tylenol suppository since pt is NPO, admission hx not done since pt is confuse and no family member at bed side, will continue to monitor pt

## 2016-07-09 NOTE — Assessment & Plan Note (Signed)
HPI: Daughter expresses some concern about [name] [name]'s breathing.  She notes a few episdoes over the last few days where he will breathe abnormally. She says this only happens for a few seconds, he denies any complaints during the episdoes.  During our visit he did have once of these episdoes where he took 3 short breaths in rapid successsion.  He deneid any chest pain, SOB, HA, or abdominal pain during this episode.  His lungs were clear on exam.  A: Abonormal breathing  P: I discussed with the daughter that I was not sure why he was breathing in this manner.  His lungs are clear and it may be provoked by pain or anxiety.  I asked her to continue to monitor this. Later in the day she called back our nursing staff, she had forgotten what we had talked about.  I asked that if he has new complaints or worsening please go to the ER.  It appears now that she did take him to the ER and he was found to have pyelonephritis which in retrospect was likely causing him discomfort.

## 2016-07-09 NOTE — ED Notes (Signed)
Daughter, Lilli Light [phone]

## 2016-07-09 NOTE — H&P (Signed)
Date: 07/09/2016               Patient Name:  [name] [name] MRN: [id number]  DOB: Jan 21, 1931 Age / Sex: 82 y.o., male   PCP: [name], DO         Medical Service: Internal Medicine Teaching Service         Attending Physician: Dr. Sid Falcon, MD    First Contact: Dr. Philipp Ovens Pager: [phone]  Second Contact: Dr. Juleen China Pager: [phone]       After Hours (After 5p/  First Contact Pager: [phone]  weekends / holidays): Second Contact Pager: [phone]   Chief Complaint: Brought in by family member for altered mental status  History of Present Illness: [name] [name] is an 82 year old male with PMHx of pulmonary embolism on anticoagulation, hypertension, prostate cancer, dementia and parkinsonian syndrome that presents to the ED for altered mental status.  When we arrived to the patient's room no family was at the bedside.  Patient has dementia and could not provide a history.  The HPI is collected from ER notes and EMR.  Patient was brought in by his daughter who has noticed a decline in mental status for the past 2 days.  The patient lives with the daughter.  She states that he has been having issues with this breathing.  Associated symptoms include rapid heart beat, subjective fever and cough. He was recently seen in the ED on 10/11 for a fall but daughter denies the patient has had any falls since.             Meds:  Current Meds  Medication Sig  . acetaminophen (TYLENOL) 500 MG tablet Take 500 mg by mouth every 4 (four) hours as needed for mild pain or headache.  [name] amLODipine (NORVASC) 2.5 MG tablet TAKE 1 TABLET (2.5 MG TOTAL) BY MOUTH DAILY. (Patient taking differently: Take 2.5 mg by mouth daily. )  . atorvastatin (LIPITOR) 20 MG tablet Take 1 tablet (20 mg total) by mouth daily.  [name] b complex vitamins tablet Take 1 tablet by mouth every morning.   . Ca Phosphate-Cholecalciferol (CALCIUM/VITAMIN D3 GUMMIES) 250-350 MG-UNIT CHEW Chew 1 Dose by mouth 2 (two) times  daily.  . carbidopa-levodopa (SINEMET IR) 25-100 MG tablet TAKE 1 TABLET BY MOUTH 3 (THREE) TIMES DAILY.  . fish oil-omega-3 fatty acids 1000 MG capsule Take 2 g by mouth every morning.   . furosemide (LASIX) 20 MG tablet Take 1 tablet (20 mg total) by mouth daily.  . Multiple Vitamins-Minerals (MULTIVITAMIN WITH MINERALS) tablet Take 1 tablet by mouth every morning.   [name] NAMENDA XR 28 MG CP24 24 hr capsule TAKE ONE CAPSULE BY MOUTH EVERY DAY  . potassium chloride (K-DUR) 10 MEQ tablet Take 1 tablet (10 mEq total) by mouth as directed. 1st dose take 2 tabs = 20 meq then decrease to 1 tab = 10 meq daily (Patient taking differently: Take 10 mEq by mouth daily. )  . QUEtiapine (SEROQUEL) 25 MG tablet Take 1 tablet (25 mg total) by mouth at bedtime.  . ranitidine (ZANTAC) 150 MG tablet TAKE 1 TABLET (150 MG TOTAL) BY MOUTH 2 (TWO) TIMES DAILY.  . vitamin C (ASCORBIC ACID) 500 MG tablet Take 500 mg by mouth every morning.   . warfarin (COUMADIN) 5 MG tablet Take 2.5-5 mg by mouth daily. 2.5 mg Monday and Thursday.  5 mg all other days.     Allergies: Allergies as of 07/08/2016 -  Review Complete 07/08/2016  Allergen Reaction Noted  . Aricept [donepezil hcl] Other (See Comments) 07/24/2013  . Other Other (See Comments) 08/17/2011  . Shellfish-derived products Other (See Comments) 01/21/2016   Past Medical History:  Diagnosis Date  . Anxiety   . Coronary artery disease, non-occlusive    with history of MI; Cath 2008 w multivessel nonobstructive CAD  . Dementia   . Depression   . GERD (gastroesophageal reflux disease)   . Hyperlipidemia   . Hypertension   . PE (pulmonary embolism)    unprovoked PE completed 6 months of warfarin, warfarin d/ced 02/12/2010, repeat PE 07/10/2010 after a long car ride and now on lifelong coumadin.  . Pituitary microadenoma ([place])    incidental finding CT 12/2009  . Prostate cancer ([place])   . Scoliosis     Family History:  Family History  Problem Relation Age  of Onset  . Hypertension Father     Passed away from cerebral hemorrhage at age of 22.  [name] Dementia Mother     Passed away  . Hypertension Child     4 adult children  . Cervical cancer Other   . Lung cancer Other   . Stroke Other   . Heart attack Neg Hx     Social History:  Social History   Social History  . Marital status: Married    Spouse name: N/A  . Number of children: 4  . Years of education: master's   Occupational History  . Retired Retired   Social History Main Topics  . Smoking status: Never Smoker  . Smokeless tobacco: Never Used  . Alcohol use No  . Drug use: No  . Sexual activity: No   Other Topics Concern  . Not on file   Social History Narrative   Lives with daughter. Wife also has severe dementia.    Drinks 1 cup of coffee a day      Review of Systems: A complete ROS was negative except as per HPI. Could not be obtained because patient could provide a history due to dementia.  Physical Exam: Blood pressure 126/69, pulse 100, temperature 101.6 F (38.7 C), temperature source Rectal, resp. rate 18, SpO2 96 %. Vitals:   07/09/16 0100 07/09/16 0136 07/09/16 0145 07/09/16 0305  BP: 122/75  122/63 126/69  Pulse: 105  107 100  Resp: 18  18   Temp:  101.6 F (38.7 C)    TempSrc:  Rectal    SpO2: 98%  96% 96%   General: Vital signs reviewed.  Patient is alert, well-developed and well-nourished, in no acute distress. Head: Normocephalic and atraumatic. Eyes: EOMI, conjunctivae normal, no scleral icterus.  Neck: Supple, trachea midline, normal ROM Cardiovascular: Tachycardic, S1 normal, S2 normal, no murmurs, gallops, or rubs. Pulmonary/Chest: Clear to auscultation bilaterally, no wheezes, rales, or rhonchi. Abdominal: Soft, non-tender, non-distended, BS +, no masses or organomegaly, there was mild guarding present.  Musculoskeletal: No joint deformities, erythema. Bilateral upper extremity rigidity  Extremities: No lower extremity edema  bilaterally,  pulses symmetric and intact bilaterally. No cyanosis or clubbing. Skin: Warm, dry and intact. No rashes or erythema.   EKG: Sinus tachycardia.  No ST elevations or T wave inversions  CXR:  Chest X ray IMPRESSION: Shallow inspiration with left lung base subsegmental atelectasis versus infiltrate.  Air within the stomach or colon versus less likely pneumoperitoneum. Clinical correlation is recommended. CT may provide better evaluation if there is clinical concern for pneumoperitoneum.  CT abdomen IMPRESSION: Mild haziness and  hypo enhancement of the upper pole of the left kidney with apparent urothelial enhancement of the left renal collecting system concerning for pyelonephritis. Correlation with urinalysis recommended.  Constipation. No evidence of bowel obstruction or active inflammation. Air distended redundant sigmoid colon with all are extension to the upper abdomen. No evidence of twisting or mechanical obstruction at this time. However, this may predispose the patient to sigmoid volvulus.  Colonic diverticulosis.  Assessment & Plan by Problem:  Pyelonephritis Patient's UA was positive for nitrites and large leukocytes were present.  CT of abdomen showed concern for pyelonephritis.  Patient received 1g Ceftriaxone in the ED. Patient's decline in mental status is likely due to his kidney infection. - Ceftriaxone  - IV NS  Dementia/Parkinsonian syndrome Patient was unable to provide a history.  He is on seroquel and carbidopa-levodopa and we will continue those on admission. - Seroquel - carbidopa-levodopa  Hx of pulmonary embolism Patient has a history of mulitple venous embolic episodes on coumadin.  He follows in the Internal Medicine coumadin clinic.  Today INR is 4.4 and on 10/16 was 4.7.  Will ask pharmacy to help with dosing - Coumadin per pharmacy  Hypertension Patient's blood pressures are stable.  Patient takes amlodipine 2.5mg  daily -  holding bp medication   Diet: NPO Code Status: FULL DVT prophylaxis: on coumadin  Dispo: Admit patient to Inpatient with expected length of stay greater than 2 midnights.  Signed: Valinda Party, DO 07/09/2016, 3:45 AM  Pager: [phone]

## 2016-07-09 NOTE — Progress Notes (Signed)
Anticoagulation Management [name] [name] is a 82 y.o. male who reports to the clinic for monitoring of warfarin treatment.    Indication: PEhistory Duration: indefinite  Anticoagulation Clinic Visit History: Patient does not report signs/symptoms of bleeding or thromboembolism.        Anticoagulation Episode Summary          Current INR goal:   2.0-3.0  TTR:   72.5 % (5.6 y)  Next INR check:   07/12/2016  INR from last check:   4.4! (07/08/2016)  Weekly max dose:     Target end date:   Indefinite  INR check location:   Coumadin Clinic  Preferred lab:     Send INR reminders to:   [facility]   Indications   Pulmonary embolism ([place]) [I26.99] Encounter for long-term use of antiplatelets/antithrombotics [Z79.02]             Comments:   History of multiple venous embolic episodes. Will continue to annual re-evaluate continued need for warfarin weighing risks vs. benefits.                   Anticoagulation Care Providers    Provider Role Specialty Phone number   Bertha Stakes, MD  Internal Medicine [phone]     ASSESSMENT Recent Results: RecentLabs       Lab Results  Component Value Date   INR 4.4 07/08/2016   INR 4.7 07/05/2016   INR 2.10 06/30/2016      Anticoagulation Dosing:              INR as of 07/08/2016 and Previous Dosing Information    INR Dt INR Goal [name] [name] Sun Mon Tue Wed Thu Fri Sat   07/08/2016 4.4 2.0-3.0 27.5 mg 5 mg 2.5 mg 5 mg 5 mg 2.5 mg 5 mg 2.5 mg    Previous description   Omit Monday and Tuesday doses and resume October 18th.                         Anticoagulation Dose Instructions as of 07/08/2016      Total Sun Mon Tue Wed Thu Fri Sat   New Dose 25 mg 5 mg 2.5 mg 5 mg 5 mg 2.5 mg 2.5 mg 2.5 mg     (5 mg x 1) (5 mg x 0.5) (5 mg x 1) (5 mg x 1) (5 mg x 0.5) (5 mg x 0.5) (5 mg x 0.5)                          Description   Held doses Monday and Tuesday (10/16 and 10/17),  hold dose again today 07/08/16     INR today: Supratherapeutic  PLAN Hold 1 dose today, then reduce to 1/2 tablet (2.5 mg) tomorrow and Saturday, 1 tablet (5 mg) Sunday, follow up in clinic on Monday (07/12/16).  Patient Instructions  Patient educated about medication as defined in this encounter and verbalized understanding by repeating back instructions provided.   Patient advised to contact clinic or seek medical attention if signs/symptoms of bleeding or thromboembolism occur.  Patient verbalized understanding by repeating back information and was advised to contact me if further medication-related questions arise. Patient was also provided an information handout.  Follow-up 4 days  [name],[name]  15 minutes spent face-to-face with the patient during the encounter. 50% of time spent on education. 50% of time was spent on assessment and plan.

## 2016-07-09 NOTE — Care Management Note (Addendum)
Case Management Note  Patient Details  Name: Raheim Kosmatka MRN: [id number] Date of Birth: [birth year]  Subjective/Objective:                 Patient admitted with pyelonephritis. Lives at home with daughter. Has baseline Parkinsons, and is incontinent of urine. Has used [name] this year for [facility]. Would like PT OT SLP RN HHA at DC. Patient has walker and 3in1 at home. CM spoke with daughter Lilli Light at the bedside about renting/ buying ramp for WC/ walker for front steps of home.  She understands it would be out of pocket, CM verified she could obtain this at [facility]. Patient in chair on RA.    Action/Plan:  Anticipate DC to home with HH.  Expected Discharge Date:                  Expected Discharge Plan:  [place]  In-House Referral:  NA  Discharge planning Services  CM Consult  Post Acute Care Choice:    Choice offered to:     DME Arranged:    DME Agency:     HH Arranged:    HH Agency:     Status of Service:  In process, will continue to follow  If discussed at Long Length of Stay Meetings, dates discussed:    Additional Comments:  Carles Collet, RN 07/09/2016, 3:15 PM

## 2016-07-09 NOTE — Assessment & Plan Note (Addendum)
HPI: Daughter feels that he is getting worse, specifically he is moving around less and is more sedentary.  He is currently taking Sinemet 25-100 three times a day.  I reviewed Dr Clydene Fake note on 03/24/16 which noted his worsening gait and dementia however noted that he was having trouble tolerating Sinemet, therefore he was starting a new medication Neopro.  However his daughter reports this cost 500 dollars a month and could not afford it.  She reports that the intolerance to Sinmet was his diarrhea, which has now been diagnosed as overload diarrhea related to a high impaction.  She did report that Dr Leonie Man encouraged her to call back if she had problems but that she had not done so before. Additionally since I saw him last he has had multiple falls including an ER visit 1 week ago.  Daughter feels his balance and movements is getting worse.  He did well with Neuro[facility] last year and she feels that he may do better if he goes back.  A: Parkinson's syndrome.  Frequent falls  P: Refer to Neuro-rehab Daughter will call Sethi's office and arrange follow up. For now will continue Sinmet 25-100 TID

## 2016-07-09 NOTE — ED Notes (Signed)
Attempted to call report x 1  

## 2016-07-09 NOTE — Progress Notes (Signed)
Subjective: Patient sleeping comfortably this morning. Son and daughter were both in the room. Per family, patient has been increasingly confused over the past few days with subjective fevers, cough, and a fast heart rate. He has a history of episodic bradycardia with an ICM in place. He follows with Dr. Meda Coffee. He was seen by his PCP yesterday and found to have an elevated INR. Daughter says patient was instructed to hold a dose of his warfarin but is unsure if the home healthy nurse was aware. He is on lifelong anticoagulation for recurrent PEs many years ago. UA in the ED was consistent with UTI and CT abdomen pelvis revealed pyelonephritis. CXR with questionable left lobe consolidation.   Objective:  Vital signs in last 24 hours: Vitals:   07/09/16 0400 07/09/16 0539 07/09/16 0540 07/09/16 0709  BP: (!) 120/105 116/78    Pulse: 115 95    Resp: 25 20    Temp:   (!) 100.7 F (38.2 C) 97.4 F (36.3 C)  TempSrc:   Oral Oral  SpO2: 97% (!) 76%     Physical Exam Constitutional: NAD, sleeping comfortably, but easily arousable HEENT: Atraumatic, normocephalic. Anicteric sclera.  Cardiovascular: Tachycaric, regular rhythm, no murmurs, rubs, or gallops.  Pulmonary/Chest: CTAB anteriorly but poor inspiratory effort, no wheezes, rales, or rhonchi.  Abdominal: Soft, non tender, non distended. +BS.  Extremities: Warm and well perfused. Distal pulses intact. No edema.  Neurological: Alert but not oriented, moans in response to questions   Assessment/Plan:  Pyelonephritis: Patient presented with AMS and found to have UA with positive nitrites, large leukocytes, and many bacteria in ED. CT abdomen/pelvis consistent with pyelonephritis.  -- S/p 1 L NS bolus and 1g IV ceftriaxone in the ED -- Continue IV ceftriaxone daily  -- Blood cultures and urine cultures pending -- NS 100 cc/hr x 12 hours  Left Lobe Consolidation: Concerning for PNA vs. Atelectasis. Patient's family reports he has been SOB  with cough and wheezing at home. Patient is febrile without leukocytosis on admission. Physical exam is limited due to AMS.  -- Continue IV ceftriaxone daily  -- Will repeat CXR possibly tomorrow to reasses  AKI: Creatinine elevated 1.7 from baseline (1 - 1.2). Likely due to his pyelonephritis.  -- Continue antibiotics  -- Continue fluids for now -- Daily BMET  Chronic Anticoagulation: On lifelong anticoagulation with warfarin for recurrent PEs many years ago. INR elevated at PCP visit yesterday 4.7. Patient was instructed to hold a dose of his warfarin but per daughter, home health nurse was unaware. INR on admission 4.4. -- Hold warfarin -- Dosing per pharmacy, appreciate recommendations   HFrEF: Currently well compensated and euvolemic. Last Echo in March 2016 with moderate systolic dysfunction, LV EF 40-45%.  -- Monitor for signs hypervolemia in the setting of fluid resuscitation  -- Takes Lasix 20 mg daily, holding due to NPO status -- No Ace due to low BP, and no beta blocker due to intermittent bradycardia per cards  Symptomatic Bradycardia: With ICM/ILR in place. Per cardiology note (10/20/2015): Patient was hospitalized in March 2016 after a syncopal episode requiring transcutaneous pacing. He was found to have underlying sinus bradycardia and frequent PVCs following by compensatory pauses. He was maintained on epinephrine and then dopamine at that time. LHC revealed non obstructive disease and ECHO showed moderate systolic dysfunction with EF 40-45%. He was seen by Dr. [name] with electrophysiology. Underlying conduction system disease was noted with RBBB, first degree AV block. He had a prior symptomatic  bradycardia episode noted in May 2014 that improved off Aricept. Decision was ultimately made to place loop recorder instead of pacemaker. EKG this admission with sinus tach.  -- Cardiac monitor   CAD: LHC in March 2016 with non obstructive disease -- ASA 81 daily, holding due to  NPO  HTN: Currently stable. Takes amlodipine daily with hold parameters per cards for SBP < 115.  -- Holding home amlodipine 2.5 mg daily   HLD: -- Hold home Lipitor 20 mg  Dementia/Parkinsonian syndrome -- Continue home Seroquel -- Continue home carbidopa-levodopa  GERD: -- Hold Zantac 150 mg BID  FEN: NS 100 cc/hr x 12 hours, replete lytes prn, NPO due to AMS VTE ppx: Holding warfarin  Code Status: FULL   Dispo: Anticipated discharge in approximately 2-3 day(s).   Velna Ochs, MD 07/09/2016, 8:48 AM Pager: [phone]

## 2016-07-10 ENCOUNTER — Inpatient Hospital Stay (HOSPITAL_COMMUNITY): Payer: Commercial Managed Care - HMO

## 2016-07-10 DIAGNOSIS — E876 Hypokalemia: Secondary | ICD-10-CM

## 2016-07-10 DIAGNOSIS — K529 Noninfective gastroenteritis and colitis, unspecified: Secondary | ICD-10-CM

## 2016-07-10 DIAGNOSIS — G3183 Dementia with Lewy bodies: Secondary | ICD-10-CM

## 2016-07-10 DIAGNOSIS — R918 Other nonspecific abnormal finding of lung field: Secondary | ICD-10-CM

## 2016-07-10 DIAGNOSIS — F0280 Dementia in other diseases classified elsewhere without behavioral disturbance: Secondary | ICD-10-CM

## 2016-07-10 DIAGNOSIS — I5022 Chronic systolic (congestive) heart failure: Secondary | ICD-10-CM

## 2016-07-10 DIAGNOSIS — B9689 Other specified bacterial agents as the cause of diseases classified elsewhere: Secondary | ICD-10-CM

## 2016-07-10 DIAGNOSIS — Z79899 Other long term (current) drug therapy: Secondary | ICD-10-CM

## 2016-07-10 DIAGNOSIS — R638 Other symptoms and signs concerning food and fluid intake: Secondary | ICD-10-CM

## 2016-07-10 DIAGNOSIS — I110 Hypertensive heart disease with heart failure: Secondary | ICD-10-CM

## 2016-07-10 DIAGNOSIS — Z7901 Long term (current) use of anticoagulants: Secondary | ICD-10-CM

## 2016-07-10 DIAGNOSIS — N10 Acute tubulo-interstitial nephritis: Secondary | ICD-10-CM

## 2016-07-10 DIAGNOSIS — K219 Gastro-esophageal reflux disease without esophagitis: Secondary | ICD-10-CM

## 2016-07-10 DIAGNOSIS — E870 Hyperosmolality and hypernatremia: Secondary | ICD-10-CM

## 2016-07-10 DIAGNOSIS — Z86711 Personal history of pulmonary embolism: Secondary | ICD-10-CM

## 2016-07-10 LAB — BASIC METABOLIC PANEL
Anion gap: 8 (ref 5–15)
BUN: 37 mg/dL — AB (ref 6–20)
CALCIUM: 8 mg/dL — AB (ref 8.9–10.3)
CO2: 23 mmol/L (ref 22–32)
CREATININE: 1.4 mg/dL — AB (ref 0.61–1.24)
Chloride: 119 mmol/L — ABNORMAL HIGH (ref 101–111)
GFR calc Af Amer: 52 mL/min — ABNORMAL LOW (ref >60)
GFR, EST NON AFRICAN AMERICAN: 45 mL/min — AB (ref >60)
GLUCOSE: 92 mg/dL (ref 65–99)
POTASSIUM: 3.2 mmol/L — AB (ref 3.5–5.1)
SODIUM: 150 mmol/L — AB (ref 135–145)

## 2016-07-10 LAB — PROTIME-INR
INR: 2.41
PROTHROMBIN TIME: 26.7 s — AB (ref 11.4–15.2)

## 2016-07-10 LAB — CBC
HEMATOCRIT: 30.6 % — AB (ref 39.0–52.0)
Hemoglobin: 9.8 g/dL — ABNORMAL LOW (ref 13.0–17.0)
MCH: 28.6 pg (ref 26.0–34.0)
MCHC: 32 g/dL (ref 30.0–36.0)
MCV: 89.2 fL (ref 78.0–100.0)
PLATELETS: 142 10*3/uL — AB (ref 150–400)
RBC: 3.43 MIL/uL — ABNORMAL LOW (ref 4.22–5.81)
RDW: 17.5 % — AB (ref 11.5–15.5)
WBC: 6.4 10*3/uL (ref 4.0–10.5)

## 2016-07-10 MED ORDER — POTASSIUM CHLORIDE 20 MEQ PO PACK
40.0000 meq | PACK | Freq: Once | ORAL | Status: AC
Start: 2016-07-10 — End: 2016-07-10
  Administered 2016-07-10: 40 meq via ORAL
  Filled 2016-07-10 (×2): qty 2

## 2016-07-10 MED ORDER — FREE WATER
150.0000 mL | Status: AC
  Administered 2016-07-10 – 2016-07-11 (×4): 150 mL via ORAL

## 2016-07-10 MED ORDER — POLYETHYLENE GLYCOL 3350 17 G PO PACK
17.0000 g | PACK | Freq: Every day | ORAL | Status: DC
  Administered 2016-07-10 – 2016-07-13 (×4): 17 g via ORAL
  Filled 2016-07-10 (×4): qty 1

## 2016-07-10 MED ORDER — FOOD THICKENER (SIMPLYTHICK)
1.0000 | ORAL | Status: DC | PRN
  Administered 2016-07-12: 1 via ORAL
  Filled 2016-07-10 (×6): qty 1

## 2016-07-10 MED ORDER — WARFARIN SODIUM 2.5 MG PO TABS
2.5000 mg | ORAL_TABLET | Freq: Once | ORAL | Status: AC
  Administered 2016-07-10: 2.5 mg via ORAL
  Filled 2016-07-10: qty 1

## 2016-07-10 MED ORDER — SENNA 8.6 MG PO TABS
1.0000 | ORAL_TABLET | Freq: Every day | ORAL | Status: DC
  Administered 2016-07-10 – 2016-07-13 (×4): 8.6 mg via ORAL
  Filled 2016-07-10 (×4): qty 1

## 2016-07-10 NOTE — Progress Notes (Signed)
ANTICOAGULATION CONSULT NOTE - Follow Up Consult  Pharmacy Consult for Warfarin Indication: h/o recurrent pulmonary embolus  Assessment: 82 yo male presenting with pyelonephritis. PTA warfarin for h/o recurrent PE. Pharmacy consulted to managed warfarin. Today's INR is 2.41, which is therapeutic. Hemoglobin and platelets are low, but currently stable. No bleeding noted.   PTA warfarin dose: 5 mg on Sunday, Tuesday and Wednesday, then 2.5 mg all other days (recent dose adjustment)  Goal of Therapy:  INR 2-3 Monitor platelets by anticoagulation protocol: Yes   Plan:  Warfarin 2.5 mg x 1 dose tonight Monitor daily INR and s/sx's bleeding   Allergies  Allergen Reactions  . Aricept [Donepezil Hcl] Other (See Comments)    Symptomatic Bradycardia.  . Other Other (See Comments)    Shrimp gives him gout  . Shellfish-Derived Products Other (See Comments)    Causes a flare up with Gout    Patient Measurements: Weight: 154 lb 9.6 oz (70.1 kg)  Vital Signs: Temp: 99.3 F (37.4 C) (10/21 0630) BP: 127/73 (10/21 0630) Pulse Rate: 86 (10/21 0630)  Labs:  Recent Labs  07/08/16 1044 07/09/16 0030 07/09/16 0649 07/09/16 0804 07/10/16 0650 07/10/16 0710  HGB  --  11.1*  --   --   --  9.8*  HCT  --  34.4*  --   --   --  30.6*  PLT  --  155  --   --   --  142*  LABPROT  --   --  30.1*  --  26.7*  --   INR 4.4  --  2.81  --  2.41  --   CREATININE  --  1.70*  --  1.52* 1.40*  --     Estimated Creatinine Clearance: 39.6 mL/min (by C-G formula based on SCr of 1.4 mg/dL (H)).   Medications:  Scheduled:  . carbidopa-levodopa  1 tablet Oral TID  . cefTRIAXone (ROCEPHIN) IVPB 1 gram/50 mL D5W  1 g Intravenous Q24H  . free water  150 mL Oral Q4H  . memantine  28 mg Oral Daily  . polyethylene glycol  17 g Oral Daily  . potassium chloride  40 mEq Oral Once  . QUEtiapine  25 mg Oral QHS  . senna  1 tablet Oral Daily  . warfarin  2.5 mg Oral ONCE-1800  . Warfarin - Pharmacist  Dosing Inpatient   Does not apply [id number]    Belia Heman, PharmD PGY1 Pharmacy Resident [phone] (Pager) 07/10/2016 12:05 PM

## 2016-07-10 NOTE — Progress Notes (Signed)
Subjective: Mental status has improved today. Patient is alert and sitting up in bed. More conversational today. He is alert to person but not place or time. Asking for his family.   Objective:  Vital signs in last 24 hours: Vitals:   07/09/16 1448 07/09/16 2210 07/10/16 0500 07/10/16 0630  BP: 123/75 127/88  127/73  Pulse: 87 94  86  Resp: 16 18  17   Temp: 97.5 F (36.4 C) 97.3 F (36.3 C)  99.3 F (37.4 C)  TempSrc: Oral     SpO2: 99% 98%  95%  Weight:   154 lb 9.6 oz (70.1 kg)    Physical Exam Constitutional: NAD, appears comfortable. Alert and sitting up in bed.  Cardiovascular: RRR, no murmurs, rubs, or gallops.  Pulmonary/Chest: CTAB, no wheezes, rales, or rhonchi.   Abdominal: Soft, non tender, non distended. +BS.  Extremities: Warm and well perfused. Distal pulses intact. No edema.  Neurological: Alert to person but not place or time.  CN II - XII grossly intact.  Skin: No rashes or erythema  Psychiatric: Normal mood and affect  Assessment/Plan:  Pyelonephritis: Patient presented with AMS and found to have UA with positive nitrites, large leukocytes, and many bacteria in ED. CT abdomen/pelvis consistent with pyelonephritis. Today is day 2 of IV antibiotics and mental status is improving.  -- Continue IV ceftriaxone daily -- Blood cultures and urine cultures pending -- Received NS 100 cc/hr x 12 hours on admission; Hold for now given CHF  Left Lobe Consolidation: On admission CXR concerning for PNA vs. Atelectasis. Patient's family reports he has been SOB with cough and wheezing at home. Patient is febrile without leukocytosis. Physical exam is limited due to dementia/AMS.  -- Continue IV ceftriaxone daily  -- Repeat CXR today   AKI: Creatinine elevated 1.7 on admission from baseline (1 - 1.2). Likely due to his pyelonephritis. Down to 1.4 today. -- Continue antibiotics  -- Monitor with daily BMET  Hypernatremia: 150 today on AM labs -- Will give free water PO  150 cc q4 hrs x 24 hours -- Recheck AM labs  Hypokalemia: Potassium 3.2 from 3.6 yesterday  -- Klor-con 40 mEq x 1 today   Chronic Diarrhea: Follows with outpatient GI and felt to be related to overflow diarrhea secondary to impaction. Discussed with PCP yesterday who was planning for outpatient cleanse with Miralax.  -- Miralax daily and senna   Chronic Anticoagulation: On lifelong anticoagulation with warfarin for recurrent PEs many years ago. INR elevated at PCP visit prior to admission 4.7. Patient was instructed to hold a dose of his warfarin but per daughter, home health nurse was unaware. INR on admission 4.4, down to 2.4 today. -- Warfarin dosing per pharmacy, appreciate recommendations   HFrEF: Currently well compensated and euvolemic. Last Echo in March 2016 with moderate systolic dysfunction, LV EF 40-45%.  -- Monitor for signs hypervolemia in the setting of recent fluid resuscitation  -- Takes Lasix 20 mg daily, holding due AMS and AKI  -- No Ace due to low BP, and no beta blocker due to intermittent bradycardia per cards  Symptomatic Bradycardia: With ICM/ILR in place. Per cardiology note (10/20/2015): Patient was hospitalized in March 2016 after a syncopal episode requiring transcutaneous pacing. He was found to have underlying sinus bradycardia and frequent PVCs following by compensatory pauses. He was maintained on epinephrine and then dopamine at that time. LHC revealed non obstructive disease and ECHO showed moderate systolic dysfunction with EF 40-45%. He was seen by  Dr. [name] with electrophysiology. Underlying conduction system disease was noted with RBBB, first degree AV block. He had a prior symptomatic bradycardia episode noted in May 2014 that improved off Aricept. Decision was ultimately made to place loop recorder instead of pacemaker. EKG this admission with sinus tach.  -- Cardiac monitor   HTN: Currently stable. Takes amlodipine daily with hold parameters per cards  for SBP < 115.  -- Holding home amlodipine 2.5 mg daily   Dementia/Parkinsonian syndrome -- Continue home Seroquel -- Continue home carbidopa-levodopa -- Continue home memantine   -- Continue Seroquel QHS   GERD: -- Hold Zantac 150 mg BID  FEN: Replace free water, Klor-con 40 mEq x 1 , Dysphagia 1 diet per SLP eval recs  VTE ppx: Holding warfarin  Code Status: FULL   Dispo: Anticipated discharge in approximately 2-3 day(s).   Velna Ochs, MD 07/10/2016, 11:38 AM Pager: [phone]

## 2016-07-10 NOTE — Evaluation (Signed)
Clinical/Bedside Swallow Evaluation Patient Details  Name: [name] [name] MRN: [id number] Date of Birth: 04-Dec-1930  Today's Date: 07/10/2016 Time: SLP Start Time (ACUTE ONLY): [id number] SLP Stop Time (ACUTE ONLY): 0940 SLP Time Calculation (min) (ACUTE ONLY): 35 min  Past Medical History:  Past Medical History:  Diagnosis Date  . Anxiety   . Coronary artery disease, non-occlusive    with history of MI; Cath 2008 w multivessel nonobstructive CAD  . Dementia   . Depression   . GERD (gastroesophageal reflux disease)   . Hyperlipidemia   . Hypertension   . PE (pulmonary embolism)    unprovoked PE completed 6 months of warfarin, warfarin d/ced 02/12/2010, repeat PE 07/10/2010 after a long car ride and now on lifelong coumadin.  . Pituitary microadenoma ([place])    incidental finding CT 12/2009  . Prostate cancer ([place])   . Scoliosis    Past Surgical History:  Past Surgical History:  Procedure Laterality Date  . LEFT HEART CATHETERIZATION WITH CORONARY ANGIOGRAM N/A 12/10/2014   Procedure: LEFT HEART CATHETERIZATION WITH CORONARY ANGIOGRAM;  Surgeon: [name], MD;  Location: [facility] C[facility] LAB;  Service: Cardiovascular;  Laterality: N/A;  . LOOP RECORDER IMPLANT N/A 12/16/2014   Procedure: LOOP RECORDER IMPLANT;  Surgeon: [name], MD;  Location: [facility] CATH LAB;  Service: Cardiovascular;  Laterality: N/A;  . PROSTATECTOMY     HPI:  Patient is an 82 y.o. male with PMH: HTN, prostate cancer, dementia, parkinsonism, PE on anticoagulation who was admitted with AMS for 2 days prior to admission, cough, fever, SOB. MD diagnosed patient with pyelonephritis. CXR on 10/21 revealed, bibasilar atelectasis but no pleural effusion.   Assessment / Plan / Recommendation Clinical Impression  Patient presents with a moderate oropharyngeal dysphagia with suspected esophageal component secondary to patient's h/o GERD as well as evidence of belching and possible regurgitation and reswallowing noted during this  evaluation. Patient exhibited generalized oral-motor weakness suspected resultant from Parkinson's and Dementia, which led to delays in oral manipulation and transit of puree texture bolus, trace-mild residuals post swallow of purees. Pharyngeal phase of dysphagia was characterized by delayed swallow initiation, multiple swallows with purees, thins, nectar thick liquids, and delayed cough and throat clearing with thin liquids.     Aspiration Risk  Moderate aspiration risk    Diet Recommendation Dysphagia 1 (Puree);Nectar-thick liquid   Liquid Administration via: Cup;No straw Medication Administration: Crushed with puree Supervision: Full supervision/cueing for compensatory strategies Compensations: Minimize environmental distractions;Slow rate;Small sips/bites;Lingual sweep for clearance of pocketing Postural Changes: Seated upright at 90 degrees;Remain upright for at least 30 minutes after po intake    Other  Recommendations Oral Care Recommendations: Oral care BID Other Recommendations: Order thickener from pharmacy;Clarify dietary restrictions;Prohibited food (jello, ice cream, thin soups);Remove water pitcher   Follow up Recommendations Skilled Nursing facility;24 hour supervision/assistance      Frequency and Duration min 2x/week  2 weeks       Prognosis Prognosis for Safe Diet Advancement: Good      Swallow Study   General Date of Onset: 07/08/16 HPI: Patient is an 82 y.o. male with PMH: HTN, prostate cancer, dementia, parkinsonism, PE on anticoagulation who was admitted with AMS for 2 days prior to admission, cough, fever, SOB. MD diagnosed patient with pyelonephritis. CXR on 10/21 revealed, bibasilar atelectasis but no pleural effusion. Type of Study: Bedside Swallow Evaluation Previous Swallow Assessment: N/A Diet Prior to this Study: NPO Temperature Spikes Noted: No Respiratory Status: Room air History of Recent Intubation: No Behavior/Cognition:  Alert;Cooperative;Confused;Lethargic/Drowsy;Requires cueing Oral Cavity Assessment: Other (comment) (mild amount of dried secretions in oral cavity, with questionable presence of oral thrush on tongue) Oral Care Completed by SLP: Yes Oral Cavity - Dentition: Adequate natural dentition Self-Feeding Abilities: Total assist Patient Positioning: Upright in bed Baseline Vocal Quality: Low vocal intensity;Other (comment) (mild tremor secondary to Parkinson's) Volitional Cough: Cognitively unable to elicit Volitional Swallow: Able to elicit    Oral/Motor/Sensory Function Overall Oral Motor/Sensory Function: Moderate impairment (suspect Parkinson's and dementia are primary reasons for oral-motor dysfunction) Facial ROM: Reduced right;Reduced left Facial Symmetry: Within Functional Limits Facial Strength: Reduced right;Reduced left Lingual ROM: Reduced right;Reduced left Lingual Symmetry: Within Functional Limits Lingual Strength: Reduced Velum: Within Functional Limits Mandible: Within Functional Limits   Ice Chips Ice chips: Not tested   Thin Liquid Thin Liquid: Impaired Presentation: Cup Pharyngeal  Phase Impairments: Suspected delayed Swallow;Cough - Delayed;Throat Clearing - Delayed;Multiple swallows    Nectar Thick Nectar Thick Liquid: Impaired Presentation: Cup Pharyngeal Phase Impairments: Suspected delayed Swallow;Multiple swallows   Honey Thick Honey Thick Liquid: Not tested   Puree Puree: Impaired Oral Phase Impairments: Reduced lingual movement/coordination Oral Phase Functional Implications: Oral residue Pharyngeal Phase Impairments: Multiple swallows   Solid     Solid: Not tested        Sonia Baller, MA, CCC-SLP 07/10/16 11:39 AM

## 2016-07-10 NOTE — Progress Notes (Signed)
  Date: 07/10/2016  Patient name: [name] [name]  Medical record number: [id number]  Date of birth: 1931-06-06   I have seen and evaluated the patient. Case d/w residents in detail. I agree with findings and plan as documented in Dr. Rivka Safer note  Will c/w IV ceftriaxone for pyelonephritis. F/u urine cx and blod cx - NGTD. Repeat CXR noted- I doubt that he has a concurrent PNA at this time but ceftriaxone should cover this as well. Of note, he was noted to be hypernatremic today likely secondary to poor PO intake secondary to underlying infection. Will continue with 150 cc of free H20 every 4 hours for 24 hours. SLP eval noted - will start dysphagia 1 diet   Aldine Contes, MD 07/10/2016, 1:07 PM

## 2016-07-11 LAB — CBC
HEMATOCRIT: 32.7 % — AB (ref 39.0–52.0)
HEMOGLOBIN: 10.3 g/dL — AB (ref 13.0–17.0)
MCH: 28.7 pg (ref 26.0–34.0)
MCHC: 31.5 g/dL (ref 30.0–36.0)
MCV: 91.1 fL (ref 78.0–100.0)
Platelets: 158 10*3/uL (ref 150–400)
RBC: 3.59 MIL/uL — AB (ref 4.22–5.81)
RDW: 17.7 % — AB (ref 11.5–15.5)
WBC: 5.8 10*3/uL (ref 4.0–10.5)

## 2016-07-11 LAB — BASIC METABOLIC PANEL
Anion gap: 7 (ref 5–15)
BUN: 30 mg/dL — ABNORMAL HIGH (ref 6–20)
CALCIUM: 8.3 mg/dL — AB (ref 8.9–10.3)
CO2: 24 mmol/L (ref 22–32)
CREATININE: 1.31 mg/dL — AB (ref 0.61–1.24)
Chloride: 120 mmol/L — ABNORMAL HIGH (ref 101–111)
GFR calc Af Amer: 56 mL/min — ABNORMAL LOW (ref >60)
GFR calc non Af Amer: 49 mL/min — ABNORMAL LOW (ref >60)
GLUCOSE: 93 mg/dL (ref 65–99)
Potassium: 3.5 mmol/L (ref 3.5–5.1)
Sodium: 151 mmol/L — ABNORMAL HIGH (ref 135–145)

## 2016-07-11 LAB — URINE CULTURE: Culture: >=100000 — AB

## 2016-07-11 LAB — PROTIME-INR
INR: 1.99
PROTHROMBIN TIME: 22.9 s — AB (ref 11.4–15.2)

## 2016-07-11 MED ORDER — FREE WATER: 200.0000 mL | Status: DC

## 2016-07-11 MED ORDER — WARFARIN SODIUM 5 MG PO TABS
5.0000 mg | ORAL_TABLET | Freq: Once | ORAL | Status: AC
  Administered 2016-07-11: 5 mg via ORAL
  Filled 2016-07-11: qty 1

## 2016-07-11 MED ORDER — CEPHALEXIN 500 MG PO CAPS
500.0000 mg | ORAL_CAPSULE | Freq: Two times a day (BID) | ORAL | Status: DC
  Administered 2016-07-12 – 2016-07-13 (×3): 500 mg via ORAL
  Filled 2016-07-11 (×3): qty 1

## 2016-07-11 MED ORDER — DEXTROSE 5 % IV SOLN
INTRAVENOUS | Status: AC
  Administered 2016-07-11: 18:00:00 via INTRAVENOUS

## 2016-07-11 MED ORDER — POTASSIUM CHLORIDE 20 MEQ PO PACK
40.0000 meq | PACK | Freq: Once | ORAL | Status: AC
Start: 2016-07-11 — End: 2016-07-11
  Administered 2016-07-11: 40 meq via ORAL
  Filled 2016-07-11: qty 2

## 2016-07-11 NOTE — Progress Notes (Signed)
Subjective: Patient seen and examined this morning.  His daughter was also present.  She reports improvement in his alertness and being more conversational. She is asking for him to be mobilized and up with assistance.  We have asked his RN to get him up to the chair today.  Objective:  Vital signs in last 24 hours: Vitals:   07/10/16 0820 07/10/16 1355 07/10/16 2328 07/10/16 2329  BP:  128/80 (!) 116/57 106/60  Pulse:  97 85 83  Resp:  20 18   Temp:  98.6 F (37 C) 99.1 F (37.3 C)   TempSrc:  Oral Oral   SpO2:  96% 99% 98%  Weight: 154 lb 9.6 oz (70.1 kg)     Height: 5\' 9"  (1.753 m)      Physical Exam Constitutional: NAD, appears comfortable, wearing  supplemental oxygen Cardiovascular: RRR, no murmurs, rubs, or gallops.  Pulmonary/Chest: CTAB, no wheezes, rales, or rhonchi.   Abdominal: Soft, non tender, non distended Extremities: Warm and well perfused. No edema.  Neurological: No focal deficits   Assessment/Plan:  Pyelonephritis: Patient presented with AMS and found to have UA with positive nitrites, large leukocytes, and many bacteria in ED. CT abdomen/pelvis consistent with pyelonephritis. Today is day 3 of IV antibiotics.  He continues to improve per his daughter. -- Continue IV ceftriaxone daily -- Blood cultures no growth -- Urine culture growing E. Coli.  Susceptibilities pending.  Anticipate transition to PO antibiotics once available   AKI: Creatinine elevated 1.7 on admission from baseline (1 - 1.2). Likely due to his pyelonephritis. Down to 1.3 today. -- Continue antibiotics  -- Monitor with daily BMET  Hypernatremia: 151 today on AM labs, slightly worse than yesterday (151) -- Continue free water PO 200 mL Q4H x 24 hours -- Recheck AM labs  Hypokalemia: Potassium 3.5 today -- Klor-con 40 mEq x 1 today again   Chronic Diarrhea: Follows with outpatient GI and felt to be related to overflow diarrhea secondary to impaction. Discussed with PCP yesterday  who was planning for outpatient cleanse with Miralax.  -- Miralax daily and senna   Chronic Anticoagulation: On lifelong anticoagulation with warfarin for recurrent PEs many years ago. INR elevated at PCP visit prior to admission 4.7. Patient was instructed to hold a dose of his warfarin but per daughter, home health nurse was unaware. INR today 1.99 -- Warfarin dosing per pharmacy, appreciate recommendations   HFrEF: Currently well compensated and euvolemic. Last Echo in March 2016 with moderate systolic dysfunction, LV EF 40-45%.  -- Monitor for signs hypervolemia in the setting of recent fluid resuscitation and free water -- Takes Lasix 20 mg daily, holding due AKI and hypernatremia -- No Ace due to low BP, and no beta blocker due to intermittent bradycardia per cards  Symptomatic Bradycardia: With ICM/ILR in place. Per cardiology note (10/20/2015): Patient was hospitalized in March 2016 after a syncopal episode requiring transcutaneous pacing. He was found to have underlying sinus bradycardia and frequent PVCs following by compensatory pauses. He was maintained on epinephrine and then dopamine at that time. LHC revealed non obstructive disease and ECHO showed moderate systolic dysfunction with EF 40-45%. He was seen by Dr. [name] with electrophysiology. Underlying conduction system disease was noted with RBBB, first degree AV block. He had a prior symptomatic bradycardia episode noted in May 2014 that improved off Aricept. Decision was ultimately made to place loop recorder instead of pacemaker. EKG this admission with sinus tach.  -- Cardiac monitor   HTN:  Currently stable. Takes amlodipine daily with hold parameters per cards for SBP < 115.  -- Holding home amlodipine 2.5 mg daily   Dementia/Parkinsonian syndrome -- Continue home Seroquel -- Continue home carbidopa-levodopa -- Continue home memantine   -- Continue Seroquel QHS   FEN: Replace free water, Klor-con 40 mEq x 1 ,  Dysphagia 1 diet per SLP eval recs  VTE ppx: warfarin per pharm Code Status: FULL   Dispo: Anticipated discharge in approximately 1-2 day(s).   Jule Ser, DO 07/11/2016, 9:19 AM Pager: [phone]

## 2016-07-11 NOTE — Progress Notes (Signed)
ANTICOAGULATION CONSULT NOTE - Follow Up Consult  Pharmacy Consult for Warfarin Indication: h/o recurrent pulmonary embolus  Assessment: 82 yo male presenting with pyelonephritis. PTA warfarin for h/o recurrent PE. Pharmacy consulted to managed warfarin. Today's INR is 1.99, which is slightly subtherapeutic. Hemoglobin is low, but currently stable. Platelets wnl. No bleeding noted.   PTA warfarin dose: 5 mg on Sunday, Tuesday and Wednesday, then 2.5 mg all other days (recent dose adjustment)  Goal of Therapy:  INR 2-3 Monitor platelets by anticoagulation protocol: Yes   Plan:  Warfarin 5 mg x 1 dose tonight Monitor daily INR and s/sx's bleeding   Allergies  Allergen Reactions  . Aricept [Donepezil Hcl] Other (See Comments)    Symptomatic Bradycardia.  . Other Other (See Comments)    Shrimp gives him gout  . Shellfish-Derived Products Other (See Comments)    Causes a flare up with Gout    Patient Measurements: Height: 5\' 9"  (175.3 cm) Weight: 154 lb 9.6 oz (70.1 kg) IBW/kg (Calculated) : 70.7  Vital Signs: Temp: 99.1 F (37.3 C) (10/21 2328) Temp Source: Oral (10/21 2328) BP: 106/60 (10/21 2329) Pulse Rate: 83 (10/21 2329)  Labs:  Recent Labs  07/09/16 0030 07/09/16 [id number] 07/09/16 0804 07/10/16 0650 07/10/16 0710 07/11/16 0551  HGB 11.1*  --   --   --  9.8* 10.3*  HCT 34.4*  --   --   --  30.6* 32.7*  PLT 155  --   --   --  142* 158  LABPROT  --  30.1*  --  26.7*  --  22.9*  INR  --  2.81  --  2.41  --  1.99  CREATININE 1.70*  --  1.52* 1.40*  --  1.31*    Estimated Creatinine Clearance: 42.4 mL/min (by C-G formula based on SCr of 1.31 mg/dL (H)).   Medications:  Scheduled:  . carbidopa-levodopa  1 tablet Oral TID  . cefTRIAXone (ROCEPHIN) IVPB 1 gram/50 mL D5W  1 g Intravenous Q24H  . free water  150 mL Oral Q4H  . free water  200 mL Oral Q4H  . memantine  28 mg Oral Daily  . polyethylene glycol  17 g Oral Daily  . potassium chloride  40 mEq Oral  Once  . QUEtiapine  25 mg Oral QHS  . senna  1 tablet Oral Daily  . Warfarin - Pharmacist Dosing Inpatient   Does not apply [id number]    Belia Heman, PharmD PGY1 Pharmacy Resident [phone] (Pager) 07/11/2016 10:55 AM

## 2016-07-12 ENCOUNTER — Ambulatory Visit: Payer: Commercial Managed Care - HMO

## 2016-07-12 ENCOUNTER — Inpatient Hospital Stay (HOSPITAL_COMMUNITY): Payer: Commercial Managed Care - HMO

## 2016-07-12 DIAGNOSIS — B9620 Unspecified Escherichia coli [E. coli] as the cause of diseases classified elsewhere: Secondary | ICD-10-CM

## 2016-07-12 DIAGNOSIS — R001 Bradycardia, unspecified: Secondary | ICD-10-CM

## 2016-07-12 LAB — BASIC METABOLIC PANEL
ANION GAP: 7 (ref 5–15)
ANION GAP: 7 (ref 5–15)
ANION GAP: 6 (ref 5–15)
BUN: 22 mg/dL — AB (ref 6–20)
BUN: 21 mg/dL — ABNORMAL HIGH (ref 6–20)
BUN: 15 mg/dL (ref 6–20)
CALCIUM: 8.3 mg/dL — AB (ref 8.9–10.3)
CALCIUM: 8.5 mg/dL — AB (ref 8.9–10.3)
CHLORIDE: 124 mmol/L — AB (ref 101–111)
CHLORIDE: 121 mmol/L — AB (ref 101–111)
CO2: 24 mmol/L (ref 22–32)
CO2: 24 mmol/L (ref 22–32)
CO2: 24 mmol/L (ref 22–32)
CREATININE: 1.07 mg/dL (ref 0.61–1.24)
Calcium: 8.6 mg/dL — ABNORMAL LOW (ref 8.9–10.3)
Chloride: 122 mmol/L — ABNORMAL HIGH (ref 101–111)
Creatinine, Ser: 1.15 mg/dL (ref 0.61–1.24)
Creatinine, Ser: 1.16 mg/dL (ref 0.61–1.24)
GFR calc Af Amer: >60 mL/min (ref >60)
GFR calc non Af Amer: 56 mL/min — ABNORMAL LOW (ref >60)
GFR calc non Af Amer: >60 mL/min (ref >60)
GFR, EST NON AFRICAN AMERICAN: 57 mL/min — AB (ref >60)
GLUCOSE: 115 mg/dL — AB (ref 65–99)
Glucose, Bld: 139 mg/dL — ABNORMAL HIGH (ref 65–99)
Glucose, Bld: 151 mg/dL — ABNORMAL HIGH (ref 65–99)
Potassium: 3.6 mmol/L (ref 3.5–5.1)
Potassium: 3.8 mmol/L (ref 3.5–5.1)
Potassium: 3.8 mmol/L (ref 3.5–5.1)
SODIUM: 153 mmol/L — AB (ref 135–145)
SODIUM: 155 mmol/L — AB (ref 135–145)
SODIUM: 151 mmol/L — AB (ref 135–145)

## 2016-07-12 LAB — PROTIME-INR
INR: 1.84
PROTHROMBIN TIME: 21.5 s — AB (ref 11.4–15.2)

## 2016-07-12 MED ORDER — AMLODIPINE BESYLATE 2.5 MG PO TABS
2.5000 mg | ORAL_TABLET | Freq: Every day | ORAL | Status: DC
  Administered 2016-07-12 – 2016-07-13 (×2): 2.5 mg via ORAL
  Filled 2016-07-12 (×2): qty 1

## 2016-07-12 MED ORDER — DEXTROSE 5 % IV SOLN
INTRAVENOUS | Status: AC
  Administered 2016-07-12: 11:00:00 via INTRAVENOUS

## 2016-07-12 MED ORDER — WARFARIN SODIUM 5 MG PO TABS
5.0000 mg | ORAL_TABLET | Freq: Once | ORAL | Status: AC
Start: 2016-07-12 — End: 2016-07-12
  Administered 2016-07-12: 5 mg via ORAL
  Filled 2016-07-12: qty 1

## 2016-07-12 NOTE — Progress Notes (Signed)
ANTICOAGULATION CONSULT NOTE - Follow Up Consult  Pharmacy Consult for Warfarin Indication: h/o recurrent pulmonary embolus  Assessment: 82 yo male presenting with pyelonephritis. PTA warfarin for h/o recurrent PE. Pharmacy consulted to managed warfarin. INR trended down to 1.84 < 1.99 over last 24 hours.  Hemoglobin is currently stable. Platelets wnl. No bleeding noted.   PTA warfarin dose: 5 mg on Sunday, Tuesday and Wednesday, then 2.5 mg all other days (recent dose adjustment)  Goal of Therapy:  INR 2-3 Monitor platelets by anticoagulation protocol: Yes   Plan:  1. Repeat Warfarin 5 mg x 1 dose tonight 2. Monitor daily INR and s/sx's bleeding   Allergies  Allergen Reactions  . Aricept [Donepezil Hcl] Other (See Comments)    Symptomatic Bradycardia.  . Other Other (See Comments)    Shrimp gives him gout  . Shellfish-Derived Products Other (See Comments)    Causes a flare up with Gout    Patient Measurements: Height: 5\' 9"  (175.3 cm) Weight: 152 lb 9.6 oz (69.2 kg) IBW/kg (Calculated) : 70.7  Vital Signs: Temp: 100.8 F (38.2 C) (10/23 0440) Temp Source: Axillary (10/23 0440) BP: 151/81 (10/23 0440) Pulse Rate: 81 (10/23 0440)  Labs:  Recent Labs  07/10/16 0650 07/10/16 0710 07/11/16 0551 07/12/16 0615  HGB  --  9.8* 10.3*  --   HCT  --  30.6* 32.7*  --   PLT  --  142* 158  --   LABPROT 26.7*  --  22.9* 21.5*  INR 2.41  --  1.99 1.84  CREATININE 1.40*  --  1.31* 1.15    Estimated Creatinine Clearance: 47.6 mL/min (by C-G formula based on SCr of 1.15 mg/dL).   Medications:  Scheduled:  . carbidopa-levodopa  1 tablet Oral TID  . cephALEXin  500 mg Oral Q12H  . memantine  28 mg Oral Daily  . polyethylene glycol  17 g Oral Daily  . QUEtiapine  25 mg Oral QHS  . senna  1 tablet Oral Daily  . warfarin  5 mg Oral ONCE-1800  . Warfarin - Pharmacist Dosing Inpatient   Does not apply q1800    Vincenza Hews, PharmD, BCPS 07/12/2016, 8:29 AM Pager:  [phone]

## 2016-07-12 NOTE — Progress Notes (Signed)
INTERNAL MEDICINE TEACHING ATTENDING ADDENDUM - [name], DO  Duration- indefinate, Indication- recurrent VTE, INR- 4.4. Agree with pharmacy recommendations as outlined in their note.

## 2016-07-12 NOTE — Progress Notes (Signed)
Modified Barium Swallow Progress Note  Patient Details  Name: [name] [name] MRN: [id number] Date of Birth: 12/24/1930  Today's Date: 07/12/2016  Modified Barium Swallow completed.  Full report located under Chart Review in the Imaging Section.  Brief recommendations include the following:  Clinical Impression  Pt demonstrates a moderate sensorimotor dysphagia including discoordinated oral phase with lingual pumping and piecemeal transit of bolus and base of tongue residuals post swallow.  The pt struggles with a cup sip, but straw sips are very rapid. The pt often transits a final portion of the bolus to the valleculae after the swallow and does not initaite a second swallow to clear. Oral dysphagia and sensory deficits result in premature spillage and delayed swallow initaition with silent penetration of thin liquids before/during the swallow. The pt was only able to clear penetrates with max verbal cueing from SLP. Aspiration did not occur, but pt would be at high risk of eventual aspiration form penetrates as well as risk from oral residuals spilling to the pharynx post swallow. Pt tolerated nectar thick liquids well, though a second swallow is still needed. Recommend pt consume a dys 3 (mecahnical soft) diet with nectar thick liquids for now. He could benefit from training a second swallow and single straw sip for potential for upgrade to thin liquids.    Swallow Evaluation Recommendations       SLP Diet Recommendations: Dysphagia 3 (Mech soft) solids;Nectar thick liquid   Liquid Administration via: Cup;Straw   Medication Administration: Whole meds with liquid   Supervision: Patient able to self feed;Intermittent supervision to cue for compensatory strategies   Compensations: Slow rate;Small sips/bites;Multiple dry swallows after each bite/sip   Postural Changes: Remain semi-upright after after feeds/meals (Comment);Seated upright at 90 degrees   Oral Care Recommendations: Oral  care BID   Other Recommendations: Order thickener from pharmacy;Clarify dietary restrictions;Prohibited food (jello, ice cream, thin soups);Remove water pitcher    Grace Haggart, [name] 07/12/2016,1:37 PM

## 2016-07-12 NOTE — Progress Notes (Signed)
  Date: 07/12/2016  Patient name: [name] [name]  Medical record number: [id number]  Date of birth: [birth year]   I have personally seen and evaluated [name] [name] and the plan of care was discussed with the house staff. Please see Dr. Rivka Safer note for complete details. I concur with her findings.  Sid Falcon, MD 07/12/2016, 3:50 PM

## 2016-07-12 NOTE — Telephone Encounter (Signed)
LFt vm for patients daughter [name] that sinemet can be increase to 4 times a day if he can tolerate it. Rn left contact information for any questions or concerns.

## 2016-07-12 NOTE — Progress Notes (Signed)
Subjective: Patient sitting up in bed eating breakfast with assistance. No complaints this morning.   Objective:  Vital signs in last 24 hours: Vitals:   07/11/16 1419 07/11/16 2117 07/12/16 0440 07/12/16 0900  BP: 123/69 (!) 141/88 (!) 151/81 (!) 155/81  Pulse: 88 78 81 87  Resp: 20 17 16    Temp: 98.7 F (37.1 C) (!) 100.6 F (38.1 C) (!) 100.8 F (38.2 C) 98.4 F (36.9 C)  TempSrc: Oral Oral Axillary Oral  SpO2: 98% 97% 92% 92%  Weight:   152 lb 9.6 oz (69.2 kg)   Height:       Physical Exam Constitutional: NAD, sitting up in bed eating breakfast  Cardiovascular: RRR, no murmurs, rubs, or gallops.  Pulmonary/Chest: CTAB, no wheezes, rales, or rhonchi.   Abdominal: Soft, non tender, non distended Extremities: Warm and well perfused. No edema.  Neurological: No focal deficits  Assessment/Plan:  Pyelonephritis:Patient presented with AMS and found to have UA with positive nitrites, large leukocytes, and many bacteria in ED. CT abdomen/pelvis consistent with pyelonephritis. Urine cultures grew pan sensitive E coli. Patient was transitioned to PO keflex 500 mg BID yesterday. Mental status has cleared and his is now back to his baseline.  -- Continue Keflex 500 mg BID (today is day 4) -- Blood cultures no growth x 2 days  Hypernatremia: Worsening today, 153 from 151 yesterday. Sodium was 144 three days ago. Likely secondary to poor po intake.  -- Patient was refusing free water replacement yesterday -- Will give D5 100 cc/hr x 10 hours today -- Recheck BMET this afternoon   AKI: Resolved  -- Continue antibiotics  -- Monitor with daily BMET  Hypokalemia: Resolved  Chronic Diarrhea: Follows with outpatient GI and felt to be related to overflow diarrhea secondary to impaction. Discussed with PCP who was planning for outpatient cleanse with Miralax. Will assist with cleanse while inpatient. Nurse tech reports 2 BM yesterday.  -- Continue Miralax and senna daily    Chronic Anticoagulation:On lifelong anticoagulation with warfarin for recurrent PEs many years ago. INR elevated at PCP visit prior to admission4.7. Patient was instructed to hold a dose of his warfarin but per daughter, home health nurse was unaware. INR today is 1.84.  -- Warfarin dosing per pharmacy, appreciate recommendations   HFrEF:Currently well compensated and euvolemic. Last Echo in March 2016 with moderate systolic dysfunction, LV EF 40-45%.  -- Monitor for signs hypervolemia in the setting of recent fluid resuscitation and free water -- Takes Lasix 20 mg daily, holding due AKI and hypernatremia -- No Ace due to low BP, and no beta blocker due to intermittent bradycardia per cards  Symptomatic Bradycardia: With ICM/ILR in place. Per cardiology note (10/20/2015): Patient was hospitalized in March 2016 after a syncopal episode requiring transcutaneous pacing. He was found to have underlying sinus bradycardia and frequent PVCs following by compensatory pauses. He was maintained on epinephrine and then dopamine at that time. LHC revealed non obstructive disease and ECHO showed moderate systolic dysfunction with EF 40-45%. He was seen by Dr. [name] with electrophysiology. Underlying conduction system disease was noted with RBBB, first degree AV block. He had a prior symptomatic bradycardia episode noted in May 2014 that improved off Aricept. Decision was ultimately made to place loop recorder instead of pacemaker. EKG this admission with sinus tach.  -- Cardiac monitor   HTN: Currently stable. Takes amlodipine daily with hold parameters per cards for SBP <115.  -- Restart home amlodipine 2.5 mg daily  Dementia/Parkinsonian syndrome -- Continue home Seroquel -- Continue homecarbidopa-levodopa -- Continue home memantine   -- Continue Seroquel QHS   FEN: Replace free water, replete lytes prn , Dysphagia 1 diet per SLP eval recs  VTE ppx: warfarin per pharm Code Status:  FULL  Dispo: Anticipated discharge tomorrow pending improvement in hypernatremia.   Velna Ochs, MD 07/12/2016, 9:57 AM Pager: [phone]

## 2016-07-12 NOTE — Consult Note (Signed)
   [facility] CM Inpatient Consult   07/12/2016  Dimitris Matovich December 01, 1930 [id number]  Came by the room to speak with the patient/family but the patient is currently off the unit. Will follow up regarding any needs to restart services with [facility] with his [facility].    Natividad Brood, RN BSN [facility] Liaison  [phone] business mobile phone Toll free office [phone]

## 2016-07-13 ENCOUNTER — Telehealth: Payer: Self-pay | Admitting: Internal Medicine

## 2016-07-13 LAB — CBC
HCT: 31.3 % — ABNORMAL LOW (ref 39.0–52.0)
HEMOGLOBIN: 9.8 g/dL — AB (ref 13.0–17.0)
MCH: 28.9 pg (ref 26.0–34.0)
MCHC: 31.3 g/dL (ref 30.0–36.0)
MCV: 92.3 fL (ref 78.0–100.0)
Platelets: 171 10*3/uL (ref 150–400)
RBC: 3.39 MIL/uL — AB (ref 4.22–5.81)
RDW: 18.1 % — ABNORMAL HIGH (ref 11.5–15.5)
WBC: 6.7 10*3/uL (ref 4.0–10.5)

## 2016-07-13 LAB — BASIC METABOLIC PANEL
Anion gap: 8 (ref 5–15)
Anion gap: 7 (ref 5–15)
BUN: 16 mg/dL (ref 6–20)
BUN: 16 mg/dL (ref 6–20)
CHLORIDE: 118 mmol/L — AB (ref 101–111)
CHLORIDE: 116 mmol/L — AB (ref 101–111)
CO2: 25 mmol/L (ref 22–32)
CO2: 24 mmol/L (ref 22–32)
Calcium: 8.2 mg/dL — ABNORMAL LOW (ref 8.9–10.3)
Calcium: 8.5 mg/dL — ABNORMAL LOW (ref 8.9–10.3)
Creatinine, Ser: 1.09 mg/dL (ref 0.61–1.24)
Creatinine, Ser: 1.05 mg/dL (ref 0.61–1.24)
GFR calc Af Amer: >60 mL/min (ref >60)
GFR calc Af Amer: >60 mL/min (ref >60)
GFR calc non Af Amer: >60 mL/min (ref >60)
GFR calc non Af Amer: >60 mL/min (ref >60)
GLUCOSE: 122 mg/dL — AB (ref 65–99)
GLUCOSE: 148 mg/dL — AB (ref 65–99)
POTASSIUM: 3.7 mmol/L (ref 3.5–5.1)
POTASSIUM: 3.8 mmol/L (ref 3.5–5.1)
Sodium: 151 mmol/L — ABNORMAL HIGH (ref 135–145)
Sodium: 147 mmol/L — ABNORMAL HIGH (ref 135–145)

## 2016-07-13 LAB — PROTIME-INR
INR: 2.07
Prothrombin Time: 23.6 seconds — ABNORMAL HIGH (ref 11.4–15.2)

## 2016-07-13 MED ORDER — FLUCONAZOLE 100 MG PO TABS
200.0000 mg | ORAL_TABLET | Freq: Once | ORAL | Status: AC
  Administered 2016-07-13: 200 mg via ORAL
  Filled 2016-07-13: qty 2

## 2016-07-13 MED ORDER — DEXTROSE 5 % IV SOLN
INTRAVENOUS | Status: DC
  Administered 2016-07-13: 09:00:00 via INTRAVENOUS

## 2016-07-13 MED ORDER — WARFARIN SODIUM 5 MG PO TABS: 5.0000 mg | ORAL_TABLET | Freq: Once | ORAL | Status: DC

## 2016-07-13 MED ORDER — FLUCONAZOLE 100 MG PO TABS: 100.0000 mg | ORAL_TABLET | Freq: Every day | ORAL | 0 refills | Status: AC

## 2016-07-13 MED ORDER — WARFARIN SODIUM 5 MG PO TABS: 2.5000 mg | ORAL_TABLET | Freq: Every day | ORAL | 1 refills | Status: DC

## 2016-07-13 MED ORDER — FLUCONAZOLE 100 MG PO TABS: 150.0000 mg | ORAL_TABLET | Freq: Every day | ORAL | Status: DC

## 2016-07-13 MED ORDER — CEPHALEXIN 500 MG PO CAPS: 500.0000 mg | ORAL_CAPSULE | Freq: Two times a day (BID) | ORAL | 0 refills | Status: DC

## 2016-07-13 MED ORDER — POLYETHYLENE GLYCOL 3350 17 G PO PACK: 17.0000 g | PACK | Freq: Every day | ORAL | 1 refills | Status: DC | PRN

## 2016-07-13 MED ORDER — SENNA 8.6 MG PO TABS: 1.0000 | ORAL_TABLET | Freq: Every day | ORAL | 0 refills | Status: AC | PRN

## 2016-07-13 NOTE — Progress Notes (Signed)
ANTICOAGULATION CONSULT NOTE - Follow Up Consult  Pharmacy Consult for Warfarin Indication: h/o recurrent pulmonary embolus  Assessment: 82 yo male presenting with pyelonephritis. PTA warfarin for h/o recurrent PE. Pharmacy consulted to managed warfarin. INR therapeutic this morning at 2.04 and CBC stable.   PTA warfarin dose: 5 mg on Sunday, Tuesday and Wednesday, then 2.5 mg all other days (recent dose adjustment)  Goal of Therapy:  INR 2-3 Monitor platelets by anticoagulation protocol: Yes   Plan:  1. Warfarin 5 mg x 1 dose tonight 2. Monitor daily INR and s/sx's bleeding 3. If discharged today would send home on 5 mg on Sunday, Tuesday and Wednesday and then 2.5 mg on all other days (prior to arrival dose).    Allergies  Allergen Reactions  . Aricept [Donepezil Hcl] Other (See Comments)    Symptomatic Bradycardia.  . Other Other (See Comments)    Shrimp gives him gout  . Shellfish-Derived Products Other (See Comments)    Causes a flare up with Gout    Patient Measurements: Height: 5\' 9"  (175.3 cm) Weight: 154 lb 8 oz (70.1 kg) IBW/kg (Calculated) : 70.7  Vital Signs: Temp: 98 F (36.7 C) (10/24 0520) Temp Source: Oral (10/23 2128) BP: 134/57 (10/24 0520) Pulse Rate: 59 (10/24 0520)  Labs:  Recent Labs  07/11/16 0551 07/12/16 0615 07/12/16 1326 07/12/16 2213 07/13/16 0405  HGB 10.3*  --   --   --  9.8*  HCT 32.7*  --   --   --  31.3*  PLT 158  --   --   --  171  LABPROT 22.9* 21.5*  --   --  23.6*  INR 1.99 1.84  --   --  2.07  CREATININE 1.31* 1.15 1.16 1.07 1.09    Estimated Creatinine Clearance: 50.9 mL/min (by C-G formula based on SCr of 1.09 mg/dL).   Medications:  Scheduled:  . amLODipine  2.5 mg Oral Daily  . carbidopa-levodopa  1 tablet Oral TID  . cephALEXin  500 mg Oral Q12H  . memantine  28 mg Oral Daily  . polyethylene glycol  17 g Oral Daily  . QUEtiapine  25 mg Oral QHS  . senna  1 tablet Oral Daily  . warfarin  5 mg Oral  ONCE-1800  . Warfarin - Pharmacist Dosing Inpatient   Does not apply q1800    Vincenza Hews, PharmD, BCPS 07/13/2016, 8:40 AM Pager: [phone]

## 2016-07-13 NOTE — Progress Notes (Signed)
Subjective: Patient is doing well. Daughter is at bedside and reports he his back to his baseline mentation. Requesting discharge.   Objective:  Vital signs in last 24 hours: Vitals:   07/12/16 0900 07/12/16 1349 07/12/16 2128 07/13/16 0520  BP: (!) 155/81 (!) 142/50 (!) 155/91 (!) 134/57  Pulse: 87 82 86 (!) 59  Resp:  20 16 20   Temp: 98.4 F (36.9 C) 99.6 F (37.6 C) 100.2 F (37.9 C) 98 F (36.7 C)  TempSrc: Oral Oral Oral   SpO2: 92% 95% 100% 99%  Weight:    154 lb 8 oz (70.1 kg)  Height:       Physical Exam Constitutional: NAD, sitting up in bed eating breakfast  Cardiovascular:RRR, no murmurs, rubs, or gallops.  Pulmonary/Chest:CTAB, no wheezes, rales, or rhonchi.  Abdominal:Soft, non tender, non distended Extremities: Warm and well perfused. No edema.  Neurological:No focal deficits  Assessment/Plan:  Pyelonephritis:Patient presented with AMS and found to have UA with positive nitrites, large leukocytes, and many bacteria in ED. CT abdomen/pelvis consistent with pyelonephritis. Urine cultures grew pan sensitive E coli. Patient was transitioned to PO keflex 500 mg BID 07/11/16. Mental status has cleared and his is now back to his baseline.  -- Continue Keflex 500 mg BID (day 5 / 10) -- Blood cultures no growth x 3 days  Hypernatremia: Improving today, 151 from 155 yesterday. Likely secondary to poor po intake.  -- Patient was refusing to drink free water  -- Continue D5 125cc/hr x 12 hours today -- Recheck BMET at 2pm   Oral Candidiasis: In the setting of continued antibiotic use  -- Fluconazole 200 mg today, followed by 150 mg daily for 7 days  AKI: Resolved  -- Continue antibiotics  -- Monitor with daily BMET  Hypokalemia: Resolved  Chronic Diarrhea: Follows with outpatient GI and felt to be related to overflow diarrhea secondary to impaction. Discussed with PCP who was planning for outpatient cleanse with Miralax. Will assist with cleanse while  inpatient. Nurse tech reports 2 BM yesterday.  -- Continue Miralax and senna daily   Chronic Anticoagulation:On lifelong anticoagulation with warfarin for recurrent PEs many years ago. INR elevated at PCP visit prior to admission4.7. Patient was instructed to hold a dose of his warfarin but per daughter, home health nurse was unaware. INR today is 1.84.  -- Warfarin dosing per pharmacy, appreciate recommendations   HFrEF:Currently well compensated and euvolemic. Last Echo in March 2016 with moderate systolic dysfunction, LV EF 40-45%.  -- Monitor for signs hypervolemia in the setting of recent fluid resuscitation and free water -- Takes Lasix 20 mg daily, holding due AKI and hypernatremia -- No Ace due to low BP, and no beta blocker due to intermittent bradycardia per cards  Symptomatic Bradycardia: With ICM/ILR in place. Per cardiology note (10/20/2015): Patient was hospitalized in March 2016 after a syncopal episode requiring transcutaneous pacing. He was found to have underlying sinus bradycardia and frequent PVCs following by compensatory pauses. He was maintained on epinephrine and then dopamine at that time. LHC revealed non obstructive disease and ECHO showed moderate systolic dysfunction with EF 40-45%. He was seen by Dr. [name] with electrophysiology. Underlying conduction system disease was noted with RBBB, first degree AV block. He had a prior symptomatic bradycardia episode noted in May 2014 that improved off Aricept. Decision was ultimately made to place loop recorder instead of pacemaker. EKG this admission with sinus tach.  -- Cardiac monitor   HTN: Currently stable. Takes amlodipine daily  with hold parameters per cards for SBP <115.  -- Restart home amlodipine 2.5 mg daily   Dementia/Parkinsonian syndrome -- Continue home Seroquel -- Continue homecarbidopa-levodopa -- Continue home memantine  -- Continue Seroquel QHS   FEN: Getting D5 fluids, replete lytes prn ,  Dysphagia 1 diet per SLP eval recs  VTE ppx: warfarin per pharm Code Status: FULL  Dispo: Anticipated discharge pending improvement in sodium. Recheck labs this afternoon.   Velna Ochs, MD 07/13/2016, 9:10 AM Pager: [phone]

## 2016-07-13 NOTE — Progress Notes (Signed)
Counseled patient on the indications, administration, and side effects of new medications - cephalexin, fluconazole, Miralax, and senna. Family member present.  Annabelle Harman, PharmD Candidate, [place]

## 2016-07-13 NOTE — Progress Notes (Signed)
Rafael Bihari to be D/C'd to home per MD order.  Discussed with the patient and all questions fully answered.  VSS, Skin clean, dry and intact without evidence of skin break down, no evidence of skin tears noted. IV catheter discontinued intact. Site without signs and symptoms of complications. Dressing and pressure applied.  An After Visit Summary was printed and given to the patient. Patient's family received prescriptions.  D/c education completed with patient/family including follow up instructions, medication list, d/c activities limitations if indicated, with other d/c instructions as indicated by MD - patient able to verbalize understanding, all questions fully answered.   Patient instructed to return to ED, call 911, or call MD for any changes in condition.   Patient escorted via [place], and D/C home via private auto.  Lynann Beaver 07/13/2016 6:57 PM

## 2016-07-13 NOTE — Progress Notes (Signed)
SLP Cancellation Note  Patient Details Name: [name] [name] MRN: [id number] DOB: 1930/12/13   Cancelled treatment:       Reason Eval/Treat Not Completed: Other (comment). [employer] not yet available for teaching with family. Will attempt tomorrow.    Leonard Hendler, [name] 07/13/2016, 1:53 PM

## 2016-07-13 NOTE — Care Management Important Message (Signed)
Important Message  Patient Details  Name: Kveon Decrescenzo MRN: [id number] Date of Birth: 1930/09/28   Medicare Important Message Given:  Yes    Gabrianna Fassnacht 07/13/2016, 1:37 PM

## 2016-07-13 NOTE — Progress Notes (Signed)
  Date: 07/13/2016  Patient name: [name] [name]  Medical record number: [id number]  Date of birth: [birth year]   I have personally seen and evaluated this patient and the plan of care was discussed with the house staff. Please see Dr. Rivka Safer note for complete details. I concur with her findings.    Possible d/c this afternoon.   Sid Falcon, MD 07/13/2016, 2:28 PM

## 2016-07-13 NOTE — Discharge Summary (Signed)
Name: [name] [name] MRN: [id number] DOB: 02/14/1931 82 y.o. PCP: [name], DO  Date of Admission: 07/08/2016 10:15 PM Date of Discharge: 07/13/2016 Attending Physician: Sid Falcon, MD  Discharge Diagnosis: 1. Pyelonephritis   Discharge Medications:   Medication List    STOP taking these medications   potassium chloride 10 MEQ tablet Commonly known as:  K-DUR     TAKE these medications   acetaminophen 500 MG tablet Commonly known as:  TYLENOL Take 500 mg by mouth every 4 (four) hours as needed for mild pain or headache.   amLODipine 2.5 MG tablet Commonly known as:  NORVASC TAKE 1 TABLET (2.5 MG TOTAL) BY MOUTH DAILY. What changed:  how much to take  how to take this  when to take this  additional instructions   atorvastatin 20 MG tablet Commonly known as:  LIPITOR Take 1 tablet (20 mg total) by mouth daily.   b complex vitamins tablet Take 1 tablet by mouth every morning.   Ca Phosphate-Cholecalciferol 250-350 MG-UNIT Chew Commonly known as:  CALCIUM/VITAMIN D3 GUMMIES Chew 1 Dose by mouth 2 (two) times daily.   carbidopa-levodopa 25-100 MG tablet Commonly known as:  SINEMET IR TAKE 1 TABLET BY MOUTH 3 (THREE) TIMES DAILY.   cephALEXin 500 MG capsule Commonly known as:  KEFLEX Take 1 capsule (500 mg total) by mouth every 12 (twelve) hours.   fish oil-omega-3 fatty acids 1000 MG capsule Take 2 g by mouth every morning.   fluconazole 100 MG tablet Commonly known as:  DIFLUCAN Take 1 tablet (100 mg total) by mouth daily.   furosemide 20 MG tablet Commonly known as:  LASIX Take 1 tablet (20 mg total) by mouth daily.   multivitamin with minerals tablet Take 1 tablet by mouth every morning.   NAMENDA XR 28 MG Cp24 24 hr capsule Generic drug:  memantine TAKE ONE CAPSULE BY MOUTH EVERY DAY   polyethylene glycol packet Commonly known as:  MIRALAX / GLYCOLAX Take 17 g by mouth daily as needed.   QUEtiapine 25 MG tablet Commonly known  as:  SEROQUEL Take 1 tablet (25 mg total) by mouth at bedtime.   ranitidine 150 MG tablet Commonly known as:  ZANTAC TAKE 1 TABLET (150 MG TOTAL) BY MOUTH 2 (TWO) TIMES DAILY.   senna 8.6 MG Tabs tablet Commonly known as:  SENOKOT Take 1 tablet (8.6 mg total) by mouth daily as needed for mild constipation.   vitamin C 500 MG tablet Commonly known as:  ASCORBIC ACID Take 500 mg by mouth every morning.   warfarin 5 MG tablet Commonly known as:  COUMADIN Take 0.5-1 tablets (2.5-5 mg total) by mouth daily. Please take half a tablet (2.5 mg) on Sun, Tues, and Wed. Please take 1 tablet (5 mg) on all other day.s What changed:  additional instructions       Disposition and follow-up:   [name] [name] was discharged from [facility] in Stable condition.  At the hospital follow up visit please address:  1.  Hypernatremia: Likely due to poor po intake while hospitalized. Patient required assistance with eating. Sodium peaked at 155. He received D5 fluids and sodium came down to 147 on discharge.   2. Thrush: Patient developed thrush after receiving IV ceftriaxone. He received one dose of fluconazole prior to discharge and was sent home with a prescription to complete a 7 day course.   3. Anticoagulation: INR 4.4 on admission. Dosing was managed per pharmacy recommendations. Home regimen  was changed to 2.5 mg on Sun, Tues, and Wed and 5 mg on all other days. INR was 2.07 on discharge.   4.  Labs / imaging needed at time of follow-up: BMET (f/u sodium) and INR   5.  Pending labs/ test needing follow-up: None  Follow-up Appointments: Follow-up Information    [name], DO. Go on 07/15/2016.   Specialty:  Internal Medicine Why:  at 8:15 am for hospital follow up  Contact information: [place] [address] [facility] Course by problem list:  1. Pyelonephritis: Patient presented with increasing confusion, subjective  fevers, and tachycardia from home. UA showed positive nitrites, large leukocytes, and many bacteria. CT abdomen pelvis was ordered due to concern for pneumoperitoneum on CXR. Pneumoperitoneum was ruled out however imaging was consistent with pyelonephritis. Patient was placed on IV ceftriaxone and mental status improved the following day. Urine cultures grew pan sensitive E coli and he was transition to PO keflex after three days. He was sent home with a prescription to complete a 10 day course.   2. Hypernatremia: Sodium was 141 on admission but began to rise throughout hospitalization, likely due to poor po intake. Patient required assistance with eating and was refusing to drink free water. He required multiple runs of IV D5 fluids to replace his free water deficit. Sodium peaked at 155 and came down to 147 on discharge.   3. Oral Candidiasis: Patient developed thrush prior to discharge in the setting of continued antibiotic use. He was given one dose of fluconazole 200 mg and sent home with a prescription for 100 mg daily to complete a 7 day course.   4. Chronic Diarrhea: Follows with outpatient GI and felt to be related to overflow diarrhea secondary to impaction. Discussed with PCP who was planning for outpatient cleanse with Miralax. Patient was given miralax and senna daily while in the hospital with good bowel movements reported per nursing staff. Discharged with Miralax and senna dailyprn.   5. Anticoagulation: On lifelong anticoagulation with warfarin for recurrent PEs many years ago. INR elevated at PCP visit prior to admission4.7. Patient was instructed to hold a dose of his warfarin but per daughter, home health nurse was unaware. INR was 4.4 on admission. Warfarin was dosed per pharmacy and he was sent home on 1/2 a tablet (2.5 mg) on Sunday, Tuesday, and Wednesdays and take 1 tablet (5 mg) all other days. INR was 2.07 on discharge.    Discharge Vitals:   BP 114/73 (BP Location: Right  Arm)   Pulse 62   Temp 98.2 F (36.8 C) (Oral)   Resp 18   Ht 5\' 9"  (1.753 m)   Wt 154 lb 8 oz (70.1 kg)   SpO2 98%   BMI 22.82 kg/m   Pertinent Labs, Studies, and Procedures:   07/08/2016 CXR 1 View: IMPRESSION: Shallow inspiration with left lung base subsegmental atelectasis versus infiltrate.  Air within the stomach or colon versus less likely pneumoperitoneum. Clinical correlation is recommended. CT may provide better evaluation if there is clinical concern for pneumoperitoneum.  07/09/2016 CT Abdomen Pelvis: IMPRESSION: Mild haziness and hypo enhancement of the upper pole of the left kidney with apparent urothelial enhancement of the left renal collecting system concerning for pyelonephritis. Correlation with urinalysis recommended.  Constipation. No evidence of bowel obstruction or active inflammation. Air distended redundant sigmoid colon with all are extension to the upper abdomen. No evidence  of twisting or mechanical obstruction at this time. However, this may predispose the patient to sigmoid volvulus.  Colonic diverticulosis.   Discharge Instructions: Discharge Instructions    Call MD for:  persistant dizziness or light-headedness    Complete by:  As directed    Call MD for:  severe uncontrolled pain    Complete by:  As directed    Call MD for:  temperature >100.4    Complete by:  As directed    Diet - low sodium heart healthy    Complete by:  As directed    Discharge instructions    Complete by:  As directed    Please take one dose of your antibiotic this evening before bed, and then twice a day for the next 5 days. For your thrush, please take fluconazole daily for the next 6 days. You may continue taking miralax and senna daily as needed for your constipation. Your warfarin dose has been changed as well. Please take 1/2 a tablet (2.5 mg) on Sunday, Tuesday, and Wednesdays and take 1 tablet (5 mg) all other days. New prescriptions have been sent to  your pharmacy. Please follow up with your primary care doctor for your scheduled appointment on Thursday. You will need to have your labs rechecked at that time.  It was a pleasure taking care of you. If you have any questions or concerns, call our clinic at [phone] or after hours call [phone] and ask for the internal medicine resident on call. Thank you!   Increase activity slowly    Complete by:  As directed       Signed: Velna Ochs, MD 07/14/2016, 2:06 PM   Pager: [phone]

## 2016-07-13 NOTE — Evaluation (Signed)
Physical Therapy Evaluation Patient Details Name: [name] [name] MRN: [id number] DOB: [birth year] Today's Date: 07/13/2016   History of Present Illness  Patient is a 82 y/o male with hx of scoliosis, prostate ca, PE, HTN, HLD, parkinsonism, dementia and CAd presents with AMS. Found to have Pyelonephritis and hyponatremia.  Clinical Impression  Patient presents with rigidity, weakness, impaired initiation, problem solving, sequencing, flat affect, and decreased balance and mobility due to above and hx of parkinsonism. PTA, pt requires assist with ADLs and ambulation using RW. Pt lives with daughter but has 24/7 aides to assist as well. Today, pt requires Max A of 2 to stand from chair with max cues and not able to safely ambulate. Pt would benefit from short term SNF but daughter reports she wants to try to manage pt at home first with help before resorting to that. Pt has all necessary equipment at home. Will follow acutely to maximize independence and mobility and ease burden of care prior to return home.      Follow Up Recommendations Home health PT;Supervision for mobility/OOB;Supervision/Assistance - 24 hour    Equipment Recommendations  None recommended by PT    Recommendations for Other Services       Precautions / Restrictions Precautions Precautions: Fall Restrictions Weight Bearing Restrictions: No      Mobility  Bed Mobility               General bed mobility comments: Up in chair upon PT arrival.   Transfers Overall transfer level: Needs assistance Equipment used: Rolling walker (2 wheeled) Transfers: Sit to/from Stand Sit to Stand: Max assist;Total assist;+2 physical assistance         General transfer comment: Requires assist of 2 to stand from recliner with manual cues for hand/foot placement due to weakness and retropulsion. Stood Gaffer.   Ambulation/Gait Ambulation/Gait assistance:  (Deferred due to safety concerns.)              Stairs            Wheelchair Mobility    Modified Rankin (Stroke Patients Only)       Balance Overall balance assessment: Needs assistance Sitting-balance support: Feet supported;Bilateral upper extremity supported Sitting balance-Leahy Scale: Poor Sitting balance - Comments: Posterior lean requiring back support.  Postural control: Posterior lean Standing balance support: During functional activity Standing balance-Leahy Scale: Zero Standing balance comment: Requires BUe support in standing and Max A of 2 for balance. Stood ~1 minute, ~45 seconds.                             Pertinent Vitals/Pain Pain Assessment: No/denies pain    Home Living Family/patient expects to be discharged to:: Private residence Living Arrangements: Children (daugter) Available Help at Discharge: Family;Available 24 hours/day;Personal care attendant Type of Home: House Home Access: Stairs to enter Entrance Stairs-Rails: Right;Left Entrance Stairs-Number of Steps: 4 Home Layout: Two level;Bed/bath upstairs;Able to live on main level with bedroom/bathroom Home Equipment: Gilford Rile - 2 wheels;Bedside commode;Wheelchair - manual;Shower seat      Prior Function Level of Independence: Needs assistance   Gait / Transfers Assistance Needed: Assist with ambulation with RW, Uses w/c at times. Needs assist standing from chairs. Sleeps in recliner sometimes.   ADL's / Homemaking Assistance Needed: Assist with ADLs from PCA.  Comments: Daughter works but has aides come in so pt is never alone.     Hand Dominance        Extremity/Trunk  Assessment   Upper Extremity Assessment: Defer to OT evaluation           Lower Extremity Assessment: Generalized weakness (Rigidity throughout BLEs and trunk)         Communication      Cognition Arousal/Alertness: Awake/alert Behavior During Therapy: Flat affect Overall Cognitive Status: History of cognitive impairments - at baseline (Decreased initiation,  sequencing, probelm solving e)                      General Comments General comments (skin integrity, edema, etc.): Daughter present during session and provided PLOF.    Exercises     Assessment/Plan    PT Assessment Patient needs continued PT services  PT Problem List Decreased strength;Decreased mobility;Impaired tone;Decreased balance;Decreased cognition;Decreased activity tolerance;Decreased range of motion;Decreased coordination          PT Treatment Interventions DME instruction;Therapeutic activities;Gait training;Therapeutic exercise;Patient/family education;Balance training;Functional mobility training;Cognitive remediation;Wheelchair mobility training    PT Goals (Current goals can be found in the Care Plan section)  Acute Rehab PT Goals Patient Stated Goal: to go home PT Goal Formulation: With patient Time For Goal Achievement: 07/27/16 Potential to Achieve Goals: Fair    Frequency Min 2X/week   Barriers to discharge        Co-evaluation               End of Session Equipment Utilized During Treatment: Gait belt Activity Tolerance: Patient tolerated treatment well Patient left: in chair;with call bell/phone within reach;with family/visitor present Nurse Communication: Mobility status;Need for lift equipment         Time: 1140-1208 PT Time Calculation (min) (ACUTE ONLY): 28 min   Charges:   PT Evaluation $PT Eval Moderate Complexity: 1 Procedure PT Treatments $Therapeutic Activity: 8-22 mins   PT G Codes:        Shakeila Pfarr A Omarr Hann 07/13/2016, 12:28 PM Wray Kearns, [place], DPT [phone]

## 2016-07-14 ENCOUNTER — Telehealth: Payer: Self-pay | Admitting: Internal Medicine

## 2016-07-14 ENCOUNTER — Other Ambulatory Visit: Payer: Self-pay

## 2016-07-14 LAB — CULTURE, BLOOD (ROUTINE X 2)
CULTURE: NO GROWTH
CULTURE: NO GROWTH

## 2016-07-14 NOTE — Progress Notes (Signed)
LM with Karolee Stamps, clinical liaison from [facility]. Patient active PTA per daughter, and would like [place] PT OT RN [facility] SLP. CM requested for [place] to contact PCP to obtain orders as orders cannot be placed in chart this many hours after it has been closed out.

## 2016-07-14 NOTE — Consult Note (Addendum)
Follow up call to check with the patient's daughter regarding the restart of services. Was able to leave a generic voicemail message with return contact information.  Awaiting a return call.  Natividad Brood, RN BSN [facility] Liaison  [phone] business mobile phone Toll free office [phone]   1530:  Received a return call from the patient's daughter. HIPAA verified, Lilli Light from [phone].  Daughter was asked regarding the restart of [facility] services.  She states she feels like they will do fine with the home health agency.  She states she appreciated the services he received last year from [facility] but felt that their community needs has been met.  She states she is awating a call from the home health agency, and she had given the inpatient RNCM their choice. She again thanked this caller for following up but denies any care management needs at this time.  Natividad Brood, RN BSN [facility] Liaison  [phone] business mobile phone Toll free office [phone]

## 2016-07-14 NOTE — Telephone Encounter (Signed)
APT. REMINDER CALL, LMTCB °

## 2016-07-15 ENCOUNTER — Ambulatory Visit (INDEPENDENT_AMBULATORY_CARE_PROVIDER_SITE_OTHER): Payer: Commercial Managed Care - HMO | Admitting: Internal Medicine

## 2016-07-15 ENCOUNTER — Ambulatory Visit: Payer: Commercial Managed Care - HMO | Admitting: Podiatry

## 2016-07-15 ENCOUNTER — Encounter: Payer: Self-pay | Admitting: Internal Medicine

## 2016-07-15 VITALS — BP 128/86 | HR 78 | Temp 98.2°F

## 2016-07-15 DIAGNOSIS — Z7901 Long term (current) use of anticoagulants: Secondary | ICD-10-CM

## 2016-07-15 DIAGNOSIS — I10 Essential (primary) hypertension: Secondary | ICD-10-CM

## 2016-07-15 DIAGNOSIS — K5900 Constipation, unspecified: Secondary | ICD-10-CM | POA: Diagnosis not present

## 2016-07-15 DIAGNOSIS — I5022 Chronic systolic (congestive) heart failure: Secondary | ICD-10-CM | POA: Diagnosis not present

## 2016-07-15 DIAGNOSIS — K5790 Diverticulosis of intestine, part unspecified, without perforation or abscess without bleeding: Secondary | ICD-10-CM

## 2016-07-15 DIAGNOSIS — Z7902 Long term (current) use of antithrombotics/antiplatelets: Secondary | ICD-10-CM

## 2016-07-15 DIAGNOSIS — K5730 Diverticulosis of large intestine without perforation or abscess without bleeding: Secondary | ICD-10-CM

## 2016-07-15 DIAGNOSIS — Z86718 Personal history of other venous thrombosis and embolism: Secondary | ICD-10-CM

## 2016-07-15 DIAGNOSIS — Z79899 Other long term (current) drug therapy: Secondary | ICD-10-CM

## 2016-07-15 DIAGNOSIS — I110 Hypertensive heart disease with heart failure: Secondary | ICD-10-CM | POA: Diagnosis not present

## 2016-07-15 DIAGNOSIS — D649 Anemia, unspecified: Secondary | ICD-10-CM | POA: Diagnosis not present

## 2016-07-15 DIAGNOSIS — E870 Hyperosmolality and hypernatremia: Secondary | ICD-10-CM | POA: Diagnosis not present

## 2016-07-15 DIAGNOSIS — G20 Parkinson's disease: Secondary | ICD-10-CM

## 2016-07-15 DIAGNOSIS — G219 Secondary parkinsonism, unspecified: Secondary | ICD-10-CM

## 2016-07-15 LAB — POCT INR: INR: 2.6

## 2016-07-15 MED ORDER — CARBIDOPA-LEVODOPA 25-100 MG PO TABS: 1.0000 | ORAL_TABLET | Freq: Four times a day (QID) | ORAL | 2 refills | Status: DC

## 2016-07-15 NOTE — Patient Instructions (Signed)
I want you to start using the Lasix and the potassium as needed.  I will call you with the lab results and further instructions.  I will clarify the orders with Home health that we want physical therapy and speech therapy for swallowing.  You may increase the Sinmet to 4 times a day.

## 2016-07-15 NOTE — Progress Notes (Signed)
[facility] Subjective:  HPI: [name] is a 82 y.o. male who presents for hospital follow up for pyleonephritis.  After our visit last week his daughter noted him to have an elevated heart rate and he was taken to EMS where he was found to have pyelonephritis.  Today he is again accompanied by his daughter [name] [name]. She reports he is doing well. He is continuing his antibiotics and has no complaints.  He has had a slight non productive cough but no fevers.  His wife also has a cough, they think its viral.   Since his hospitalization he has been taking all of his medications with the exception of potassium which he was instructed to hold.  He is still taking Lasix.  He has to be prompted to drink water.  Daughter feels he has some difficulty swallowing.  He was seen by SLP in the hospital who recommended a Dysphagia 3 diet with nectar thick liquids due to moderate sensorimotor dysphagia. I had previously ordered [facility] for him however daughter is not sure if he will be able to make it out of the house and it is very difficult to get him to appointments.  Home health PT and OT was not ordered in the hospital. He has continued with Sinmet TID.  Daughter did discuss with Dr Clydene Fake nurse and he was OK with increase to 4 times a day.     Review of Systems: Per HPI Objective:  Physical Exam: Vitals:   07/15/16 0847  BP: 128/86  Pulse: 78  Temp: 98.2 F (36.8 C)  TempSrc: Oral  SpO2: 98%  Physical Exam  Constitutional: He is well-developed, well-nourished, and in no distress.  In wheelchair, answers questions with short answers, fell asleep during encounter.  HENT:  Mouth/Throat: Oropharynx is clear and moist. No oropharyngeal exudate.  Cardiovascular: Normal rate and regular rhythm.   Pulmonary/Chest: Effort normal and breath sounds normal. He has no wheezes. He has no rales.  Abdominal: Soft. There is no tenderness.  Musculoskeletal:  No appreciated  muscular rigidity, occasional mild termor of right hand.  Gait not assessed  Nursing note and vitals reviewed.   Assessment & Plan:  Essential hypertension, benign A: Essential HTN at goal  P: Continue amlodipine 2.5mg  daily  Chronic systolic heart failure (HCC) A: Chronic systolic CHF  P: No signs of volume overload, he was hypernatremic in the hosptial with poor PO intake.  Will repeat BMP today but likely dehydrated.  WIll have patient hold Lasix for now, use PRN with 73mEq of potassium for swelling.  Daughter understands instructions and will call if weight increase, shortness of breath, or lower extremity swelling.  Diverticulosis A: Diverticulosis and constipation  P: Diverticulosis noted on CT, likely due to his constipation.  Fortunately constipation was treated in hosptial with reported large bowel movements.  Will have him continue senna daily and miralax PRN.  Parkinsonian syndrome (HCC) A: Parkinsonsian syndrome  P: Increase Sinmet to 4 times daily Home health PT, OT, RN and SLP ordered.  Encounter for long-term use of antiplatelets/antithrombotics A: Long term use of anticoagulation for Hx DVT  P: INR checked today, theraputic.  Will continue  2.5 mg on Sun, Tues, and Wed and 5 mg on all other days.  Will have him follow up on 11/6 with Dr Elie Confer  Normocytic anemia, not due to blood loss A: Normocytic anemia  P: Suspect his anemia was dilutional due to IVF.  Will recheck CBC today, no reported bleeding.  CBC shows mildly improved Hgb, will repeat at next visit  Hypernatremia A: Hypernatremia Likely due to poor PO intake and lasix use  P: Hold Lasix, may restart PRN for swelling, weight gain.   Medications Ordered Meds ordered this encounter  Medications  . carbidopa-levodopa (SINEMET IR) 25-100 MG tablet    Sig: Take 1 tablet by mouth 4 (four) times daily.    Dispense:  120 tablet    Refill:  2  . furosemide (LASIX) 20 MG tablet    Sig: Take 1  tablet (20 mg total) by mouth daily as needed.    Dispense:  30 tablet  . potassium chloride (K-DUR) 10 MEQ tablet    Sig: Take 1 tablet (10 mEq total) by mouth daily as needed (use on days when taking lasix).    Dispense:  30 tablet    Refill:  0   Other Orders Orders Placed This Encounter  Procedures  . BMP8+Anion Gap  . CBC no Diff  . Ambulatory referral to Home Health    Referral Priority:   Routine    Referral Type:   Home Health Care    Referral Reason:   Specialty Services Required    Requested Specialty:   [facility]    Number of Visits Requested:   1  . POCT INR   Follow Up: Return in about 4 weeks (around 08/12/2016).

## 2016-07-16 ENCOUNTER — Telehealth: Payer: Self-pay

## 2016-07-16 DIAGNOSIS — G20 Parkinson's disease: Secondary | ICD-10-CM | POA: Diagnosis not present

## 2016-07-16 DIAGNOSIS — N12 Tubulo-interstitial nephritis, not specified as acute or chronic: Secondary | ICD-10-CM | POA: Diagnosis not present

## 2016-07-16 DIAGNOSIS — I110 Hypertensive heart disease with heart failure: Secondary | ICD-10-CM | POA: Diagnosis not present

## 2016-07-16 DIAGNOSIS — F0280 Dementia in other diseases classified elsewhere without behavioral disturbance: Secondary | ICD-10-CM | POA: Diagnosis not present

## 2016-07-16 DIAGNOSIS — I5022 Chronic systolic (congestive) heart failure: Secondary | ICD-10-CM | POA: Diagnosis not present

## 2016-07-16 LAB — BMP8+ANION GAP
Anion Gap: 16 mmol/L (ref 10.0–18.0)
BUN/Creatinine Ratio: 16 (ref 10–24)
BUN: 16 mg/dL (ref 8–27)
CALCIUM: 8.6 mg/dL (ref 8.6–10.2)
CHLORIDE: 111 mmol/L — AB (ref 96–106)
CO2: 20 mmol/L (ref 18–29)
Creatinine, Ser: 0.98 mg/dL (ref 0.76–1.27)
GFR, EST AFRICAN AMERICAN: 82 mL/min/{1.73_m2} (ref >59)
GFR, EST NON AFRICAN AMERICAN: 71 mL/min/{1.73_m2} (ref >59)
GLUCOSE: 101 mg/dL — AB (ref 65–99)
POTASSIUM: 3.9 mmol/L (ref 3.5–5.2)
SODIUM: 147 mmol/L — AB (ref 134–144)

## 2016-07-16 LAB — CBC
HEMOGLOBIN: 10.7 g/dL — AB (ref 12.6–17.7)
Hematocrit: 33.7 % — ABNORMAL LOW (ref 37.5–51.0)
MCH: 28.7 pg (ref 26.6–33.0)
MCHC: 31.8 g/dL (ref 31.5–35.7)
MCV: 90 fL (ref 79–97)
PLATELETS: 291 10*3/uL (ref 150–379)
RBC: 3.73 x10E6/uL — ABNORMAL LOW (ref 4.14–5.80)
RDW: 17 % — AB (ref 12.3–15.4)
WBC: 6.3 10*3/uL (ref 3.4–10.8)

## 2016-07-16 MED ORDER — POTASSIUM CHLORIDE ER 10 MEQ PO TBCR: 10.0000 meq | EXTENDED_RELEASE_TABLET | Freq: Every day | ORAL | 0 refills | Status: DC | PRN

## 2016-07-16 MED ORDER — FUROSEMIDE 20 MG PO TABS: 20.0000 mg | ORAL_TABLET | Freq: Every day | ORAL | Status: DC | PRN

## 2016-07-16 NOTE — Assessment & Plan Note (Signed)
A: Hypernatremia Likely due to poor PO intake and lasix use  P: Hold Lasix, may restart PRN for swelling, weight gain.

## 2016-07-16 NOTE — Assessment & Plan Note (Signed)
A: Essential HTN at goal  P: Continue amlodipine 2.5mg  daily

## 2016-07-16 NOTE — Telephone Encounter (Signed)
Langley Gauss from [facility] requesting VO. Please call back.

## 2016-07-16 NOTE — Telephone Encounter (Signed)
Dennis,PT from [facility] - requesting verbal orders for "PT 2 times a week x 4 weeks and Medical SW to see pt". VO given - if not appropriate let me know. Thanks

## 2016-07-16 NOTE — Assessment & Plan Note (Signed)
A: Normocytic anemia  P: Suspect his anemia was dilutional due to IVF.  Will recheck CBC today, no reported bleeding.  CBC shows mildly improved Hgb, will repeat at next visit

## 2016-07-16 NOTE — Telephone Encounter (Signed)
Agree with VO

## 2016-07-16 NOTE — Assessment & Plan Note (Signed)
A: Parkinsonsian syndrome  P: Increase Sinmet to 4 times daily Home health PT, OT, RN and SLP ordered.

## 2016-07-16 NOTE — Assessment & Plan Note (Signed)
A: Long term use of anticoagulation for Hx DVT  P: INR checked today, theraputic.  Will continue  2.5 mg on Sun, Tues, and Wed and 5 mg on all other days.  Will have him follow up on 11/6 with Dr Elie Confer

## 2016-07-16 NOTE — Assessment & Plan Note (Signed)
A: Diverticulosis and constipation  P: Diverticulosis noted on CT, likely due to his constipation.  Fortunately constipation was treated in hosptial with reported large bowel movements.  Will have him continue senna daily and miralax PRN.

## 2016-07-16 NOTE — Assessment & Plan Note (Signed)
A: Chronic systolic CHF  P: No signs of volume overload, he was hypernatremic in the hosptial with poor PO intake.  Will repeat BMP today but likely dehydrated.  WIll have patient hold Lasix for now, use PRN with 69mEq of potassium for swelling.  Daughter understands instructions and will call if weight increase, shortness of breath, or lower extremity swelling.

## 2016-07-18 NOTE — Progress Notes (Signed)
Indication: Recurrent venous thromboembolism.  Duration: Indefinite.  INR above target.  Agree with Dr. Marlaine Hind assessment and plan as documented.

## 2016-07-19 DIAGNOSIS — N12 Tubulo-interstitial nephritis, not specified as acute or chronic: Secondary | ICD-10-CM | POA: Diagnosis not present

## 2016-07-19 DIAGNOSIS — F0280 Dementia in other diseases classified elsewhere without behavioral disturbance: Secondary | ICD-10-CM | POA: Diagnosis not present

## 2016-07-19 DIAGNOSIS — G20 Parkinson's disease: Secondary | ICD-10-CM | POA: Diagnosis not present

## 2016-07-19 DIAGNOSIS — I110 Hypertensive heart disease with heart failure: Secondary | ICD-10-CM | POA: Diagnosis not present

## 2016-07-19 DIAGNOSIS — I5022 Chronic systolic (congestive) heart failure: Secondary | ICD-10-CM | POA: Diagnosis not present

## 2016-07-21 ENCOUNTER — Telehealth: Payer: Self-pay

## 2016-07-21 DIAGNOSIS — F0280 Dementia in other diseases classified elsewhere without behavioral disturbance: Secondary | ICD-10-CM | POA: Diagnosis not present

## 2016-07-21 DIAGNOSIS — I110 Hypertensive heart disease with heart failure: Secondary | ICD-10-CM | POA: Diagnosis not present

## 2016-07-21 DIAGNOSIS — N12 Tubulo-interstitial nephritis, not specified as acute or chronic: Secondary | ICD-10-CM | POA: Diagnosis not present

## 2016-07-21 DIAGNOSIS — G20 Parkinson's disease: Secondary | ICD-10-CM | POA: Diagnosis not present

## 2016-07-21 DIAGNOSIS — I5022 Chronic systolic (congestive) heart failure: Secondary | ICD-10-CM | POA: Diagnosis not present

## 2016-07-21 NOTE — Telephone Encounter (Signed)
[name] from [facility] requesting VO.

## 2016-07-22 ENCOUNTER — Telehealth: Payer: Self-pay | Admitting: Neurology

## 2016-07-22 ENCOUNTER — Other Ambulatory Visit: Payer: Self-pay | Admitting: Internal Medicine

## 2016-07-22 ENCOUNTER — Other Ambulatory Visit: Payer: Self-pay | Admitting: Neurology

## 2016-07-22 DIAGNOSIS — I5022 Chronic systolic (congestive) heart failure: Secondary | ICD-10-CM | POA: Diagnosis not present

## 2016-07-22 DIAGNOSIS — R451 Restlessness and agitation: Secondary | ICD-10-CM

## 2016-07-22 DIAGNOSIS — N12 Tubulo-interstitial nephritis, not specified as acute or chronic: Secondary | ICD-10-CM | POA: Diagnosis not present

## 2016-07-22 DIAGNOSIS — I110 Hypertensive heart disease with heart failure: Secondary | ICD-10-CM | POA: Diagnosis not present

## 2016-07-22 DIAGNOSIS — G20 Parkinson's disease: Secondary | ICD-10-CM | POA: Diagnosis not present

## 2016-07-22 DIAGNOSIS — F0280 Dementia in other diseases classified elsewhere without behavioral disturbance: Secondary | ICD-10-CM | POA: Diagnosis not present

## 2016-07-22 NOTE — Telephone Encounter (Signed)
LFt vm for patients daughter [name]. Rn left vm if a 90 day supply would be cheaper for her father. Pt has been on namenda xr since 11/2015.

## 2016-07-22 NOTE — Telephone Encounter (Signed)
I agree

## 2016-07-22 NOTE — Telephone Encounter (Signed)
Daughter [name] called to request switch from [facility] XR 28 MG CP24 24 hr capsule to regular Namenda while patient is in doughnut hole, this will help with the cost of the medication until new benefits start over January 1st.

## 2016-07-22 NOTE — Telephone Encounter (Signed)
Pt's daughter called in stating even with the 90 day supply it will not be cheaper. The regular Namenda is cheaper. If 90 day is cheaper please do that. However, she says that if pt will be affected by going to the regular Namenda then to let her know they will try and come up with the money. Please call and advise

## 2016-07-22 NOTE — Telephone Encounter (Signed)
Called kim [facility] back , she would like a PT/OT for dyshagia, VO approval given, do you agree

## 2016-07-26 ENCOUNTER — Other Ambulatory Visit: Payer: Self-pay | Admitting: Internal Medicine

## 2016-07-26 ENCOUNTER — Ambulatory Visit: Payer: Commercial Managed Care - HMO

## 2016-07-26 DIAGNOSIS — N12 Tubulo-interstitial nephritis, not specified as acute or chronic: Secondary | ICD-10-CM | POA: Diagnosis not present

## 2016-07-26 DIAGNOSIS — I5022 Chronic systolic (congestive) heart failure: Secondary | ICD-10-CM | POA: Diagnosis not present

## 2016-07-26 DIAGNOSIS — I5023 Acute on chronic systolic (congestive) heart failure: Secondary | ICD-10-CM

## 2016-07-26 DIAGNOSIS — I110 Hypertensive heart disease with heart failure: Secondary | ICD-10-CM | POA: Diagnosis not present

## 2016-07-26 DIAGNOSIS — G20 Parkinson's disease: Secondary | ICD-10-CM | POA: Diagnosis not present

## 2016-07-26 DIAGNOSIS — F0280 Dementia in other diseases classified elsewhere without behavioral disturbance: Secondary | ICD-10-CM | POA: Diagnosis not present

## 2016-07-26 DIAGNOSIS — I10 Essential (primary) hypertension: Secondary | ICD-10-CM

## 2016-07-26 NOTE — Telephone Encounter (Signed)
Kindly let the daughter know that the only way to find out is to switch to regular Namenda 90 day supply and if patient gets worse may be worth spending the money for the long-acting one

## 2016-07-27 ENCOUNTER — Other Ambulatory Visit: Payer: Self-pay | Admitting: Neurology

## 2016-07-27 DIAGNOSIS — I5022 Chronic systolic (congestive) heart failure: Secondary | ICD-10-CM | POA: Diagnosis not present

## 2016-07-27 DIAGNOSIS — G20 Parkinson's disease: Secondary | ICD-10-CM | POA: Diagnosis not present

## 2016-07-27 DIAGNOSIS — N12 Tubulo-interstitial nephritis, not specified as acute or chronic: Secondary | ICD-10-CM | POA: Diagnosis not present

## 2016-07-27 DIAGNOSIS — I110 Hypertensive heart disease with heart failure: Secondary | ICD-10-CM | POA: Diagnosis not present

## 2016-07-27 DIAGNOSIS — F0280 Dementia in other diseases classified elsewhere without behavioral disturbance: Secondary | ICD-10-CM | POA: Diagnosis not present

## 2016-07-27 MED ORDER — MEMANTINE HCL 10 MG PO TABS: 10.0000 mg | ORAL_TABLET | Freq: Two times a day (BID) | ORAL | 3 refills | Status: DC

## 2016-07-27 NOTE — Telephone Encounter (Signed)
LFt vm for patients daughter [name] that the regular namenda was sent to [facility] on [address]. RN left vm that if his memory gets worse on the regular, they may have to switch back to the namenda XR. Rn left number for daughter to call back.

## 2016-07-27 NOTE — Telephone Encounter (Signed)
LFt vm for patients [name] [name] that the regular namenda was sent to [facility] on [address]

## 2016-07-28 DIAGNOSIS — F0280 Dementia in other diseases classified elsewhere without behavioral disturbance: Secondary | ICD-10-CM | POA: Diagnosis not present

## 2016-07-28 DIAGNOSIS — G20 Parkinson's disease: Secondary | ICD-10-CM | POA: Diagnosis not present

## 2016-07-28 DIAGNOSIS — I110 Hypertensive heart disease with heart failure: Secondary | ICD-10-CM | POA: Diagnosis not present

## 2016-07-28 DIAGNOSIS — N12 Tubulo-interstitial nephritis, not specified as acute or chronic: Secondary | ICD-10-CM | POA: Diagnosis not present

## 2016-07-28 DIAGNOSIS — I5022 Chronic systolic (congestive) heart failure: Secondary | ICD-10-CM | POA: Diagnosis not present

## 2016-07-29 ENCOUNTER — Encounter: Payer: Self-pay | Admitting: Podiatry

## 2016-07-29 ENCOUNTER — Ambulatory Visit (INDEPENDENT_AMBULATORY_CARE_PROVIDER_SITE_OTHER): Payer: Commercial Managed Care - HMO | Admitting: Podiatry

## 2016-07-29 DIAGNOSIS — B351 Tinea unguium: Secondary | ICD-10-CM

## 2016-07-29 DIAGNOSIS — M79676 Pain in unspecified toe(s): Secondary | ICD-10-CM

## 2016-07-29 DIAGNOSIS — M79606 Pain in leg, unspecified: Secondary | ICD-10-CM

## 2016-07-29 NOTE — Progress Notes (Signed)
Patient ID: [name] [name], male   DOB: 02/09/1931, 82 y.o.   MRN: [id number] Complaint:  Visit Type: Patient returns to my office for continued preventative foot care services. Complaint: Patient states" my nails have grown long and thick and become painful to walk and wear shoes" . The patient presents for preventative foot care services. No changes to ROS  Podiatric Exam: Vascular: dorsalis pedis and posterior tibial pulses are palpable bilateral. Capillary return is immediate. Temperature gradient is WNL. Skin turgor WNL  Sensorium: Normal Semmes Weinstein monofilament test. Normal tactile sensation bilaterally. Nail Exam: Pt has thick disfigured discolored nails with subungual debris noted bilateral entire nail hallux through fifth toenails Ulcer Exam: There is no evidence of ulcer or pre-ulcerative changes or infection. Orthopedic Exam: Muscle tone and strength are WNL. No limitations in general ROM. No crepitus or effusions noted. Foot type and digits show no abnormalities. Bony prominences are unremarkable. Hyperpigmentation due to gout. Skin: No Porokeratosis. No infection or ulcers  Diagnosis:  Onychomycosis, , Pain in right toe, pain in left toes  Treatment & Plan Procedures and Treatment: Consent by patient was obtained for treatment procedures. The patient understood the discussion of treatment and procedures well. All questions were answered thoroughly reviewed. Debridement of mycotic and hypertrophic toenails, 1 through 5 bilateral and clearing of subungual debris. No ulceration, no infection noted.  Return Visit-Office Procedure: Patient instructed to return to the office for a follow up visit 10 weeks  for continued evaluation and treatment.   Iyan Flett DPM    Kalmen Lollar DPM 

## 2016-08-01 ENCOUNTER — Emergency Department (HOSPITAL_COMMUNITY): Payer: Commercial Managed Care - HMO

## 2016-08-01 ENCOUNTER — Encounter (HOSPITAL_COMMUNITY): Payer: Self-pay | Admitting: *Deleted

## 2016-08-01 ENCOUNTER — Inpatient Hospital Stay (HOSPITAL_COMMUNITY)
Admission: EM | Admit: 2016-08-01 | Discharge: 2016-08-08 | DRG: 389 | Disposition: A | Discharge status: Home / Self Care | Payer: Commercial Managed Care - HMO | Attending: Oncology | Admitting: Oncology

## 2016-08-01 DIAGNOSIS — K5939 Other megacolon: Secondary | ICD-10-CM | POA: Diagnosis present

## 2016-08-01 DIAGNOSIS — K219 Gastro-esophageal reflux disease without esophagitis: Secondary | ICD-10-CM | POA: Diagnosis present

## 2016-08-01 DIAGNOSIS — R140 Abdominal distension (gaseous): Secondary | ICD-10-CM | POA: Diagnosis not present

## 2016-08-01 DIAGNOSIS — G20 Parkinson's disease: Secondary | ICD-10-CM | POA: Diagnosis present

## 2016-08-01 DIAGNOSIS — D72819 Decreased white blood cell count, unspecified: Secondary | ICD-10-CM | POA: Diagnosis present

## 2016-08-01 DIAGNOSIS — I252 Old myocardial infarction: Secondary | ICD-10-CM | POA: Diagnosis not present

## 2016-08-01 DIAGNOSIS — I2699 Other pulmonary embolism without acute cor pulmonale: Secondary | ICD-10-CM | POA: Diagnosis present

## 2016-08-01 DIAGNOSIS — K6289 Other specified diseases of anus and rectum: Secondary | ICD-10-CM | POA: Diagnosis present

## 2016-08-01 DIAGNOSIS — I5042 Chronic combined systolic (congestive) and diastolic (congestive) heart failure: Secondary | ICD-10-CM | POA: Diagnosis present

## 2016-08-01 DIAGNOSIS — B9689 Other specified bacterial agents as the cause of diseases classified elsewhere: Secondary | ICD-10-CM | POA: Diagnosis not present

## 2016-08-01 DIAGNOSIS — I2510 Atherosclerotic heart disease of native coronary artery without angina pectoris: Secondary | ICD-10-CM | POA: Diagnosis present

## 2016-08-01 DIAGNOSIS — Z91013 Allergy to seafood: Secondary | ICD-10-CM

## 2016-08-01 DIAGNOSIS — M419 Scoliosis, unspecified: Secondary | ICD-10-CM | POA: Diagnosis present

## 2016-08-01 DIAGNOSIS — I110 Hypertensive heart disease with heart failure: Secondary | ICD-10-CM | POA: Diagnosis present

## 2016-08-01 DIAGNOSIS — R4182 Altered mental status, unspecified: Secondary | ICD-10-CM

## 2016-08-01 DIAGNOSIS — Z888 Allergy status to other drugs, medicaments and biological substances status: Secondary | ICD-10-CM

## 2016-08-01 DIAGNOSIS — I4510 Unspecified right bundle-branch block: Secondary | ICD-10-CM | POA: Diagnosis present

## 2016-08-01 DIAGNOSIS — Z8546 Personal history of malignant neoplasm of prostate: Secondary | ICD-10-CM

## 2016-08-01 DIAGNOSIS — G309 Alzheimer's disease, unspecified: Secondary | ICD-10-CM | POA: Diagnosis present

## 2016-08-01 DIAGNOSIS — R410 Disorientation, unspecified: Secondary | ICD-10-CM | POA: Diagnosis not present

## 2016-08-01 DIAGNOSIS — I493 Ventricular premature depolarization: Secondary | ICD-10-CM | POA: Diagnosis present

## 2016-08-01 DIAGNOSIS — N390 Urinary tract infection, site not specified: Secondary | ICD-10-CM | POA: Diagnosis present

## 2016-08-01 DIAGNOSIS — E870 Hyperosmolality and hypernatremia: Secondary | ICD-10-CM | POA: Diagnosis not present

## 2016-08-01 DIAGNOSIS — F329 Major depressive disorder, single episode, unspecified: Secondary | ICD-10-CM | POA: Diagnosis present

## 2016-08-01 DIAGNOSIS — I5022 Chronic systolic (congestive) heart failure: Secondary | ICD-10-CM | POA: Diagnosis present

## 2016-08-01 DIAGNOSIS — Z0189 Encounter for other specified special examinations: Secondary | ICD-10-CM

## 2016-08-01 DIAGNOSIS — R482 Apraxia: Secondary | ICD-10-CM | POA: Diagnosis present

## 2016-08-01 DIAGNOSIS — F0280 Dementia in other diseases classified elsewhere without behavioral disturbance: Secondary | ICD-10-CM | POA: Diagnosis present

## 2016-08-01 DIAGNOSIS — Z7982 Long term (current) use of aspirin: Secondary | ICD-10-CM | POA: Diagnosis not present

## 2016-08-01 DIAGNOSIS — E876 Hypokalemia: Secondary | ICD-10-CM | POA: Diagnosis present

## 2016-08-01 DIAGNOSIS — Z86711 Personal history of pulmonary embolism: Secondary | ICD-10-CM

## 2016-08-01 DIAGNOSIS — Z4682 Encounter for fitting and adjustment of non-vascular catheter: Secondary | ICD-10-CM | POA: Diagnosis not present

## 2016-08-01 DIAGNOSIS — K562 Volvulus: Secondary | ICD-10-CM | POA: Diagnosis present

## 2016-08-01 DIAGNOSIS — Z7901 Long term (current) use of anticoagulants: Secondary | ICD-10-CM | POA: Diagnosis not present

## 2016-08-01 DIAGNOSIS — Z9079 Acquired absence of other genital organ(s): Secondary | ICD-10-CM

## 2016-08-01 DIAGNOSIS — R1084 Generalized abdominal pain: Secondary | ICD-10-CM | POA: Diagnosis not present

## 2016-08-01 DIAGNOSIS — K5669 Other partial intestinal obstruction: Secondary | ICD-10-CM | POA: Diagnosis not present

## 2016-08-01 DIAGNOSIS — E785 Hyperlipidemia, unspecified: Secondary | ICD-10-CM | POA: Diagnosis present

## 2016-08-01 DIAGNOSIS — R933 Abnormal findings on diagnostic imaging of other parts of digestive tract: Secondary | ICD-10-CM | POA: Diagnosis not present

## 2016-08-01 DIAGNOSIS — K6389 Other specified diseases of intestine: Secondary | ICD-10-CM | POA: Diagnosis not present

## 2016-08-01 LAB — I-STAT TROPONIN, ED: TROPONIN I, POC: 0.02 ng/mL (ref 0.00–0.08)

## 2016-08-01 LAB — URINE MICROSCOPIC-ADD ON

## 2016-08-01 LAB — I-STAT CG4 LACTIC ACID, ED: LACTIC ACID, VENOUS: 1.89 mmol/L (ref 0.5–1.9)

## 2016-08-01 LAB — CBC WITH DIFFERENTIAL/PLATELET
BASOS PCT: 1 %
Basophils Absolute: 0 10*3/uL (ref 0.0–0.1)
Eosinophils Absolute: 0.2 10*3/uL (ref 0.0–0.7)
Eosinophils Relative: 4 %
HEMATOCRIT: 33.7 % — AB (ref 39.0–52.0)
HEMOGLOBIN: 10.8 g/dL — AB (ref 13.0–17.0)
LYMPHS ABS: 1.3 10*3/uL (ref 0.7–4.0)
Lymphocytes Relative: 38 %
MCH: 29.3 pg (ref 26.0–34.0)
MCHC: 32 g/dL (ref 30.0–36.0)
MCV: 91.3 fL (ref 78.0–100.0)
MONOS PCT: 10 %
Monocytes Absolute: 0.4 10*3/uL (ref 0.1–1.0)
NEUTROS ABS: 1.6 10*3/uL — AB (ref 1.7–7.7)
NEUTROS PCT: 47 %
Platelets: 227 10*3/uL (ref 150–400)
RBC: 3.69 MIL/uL — AB (ref 4.22–5.81)
RDW: 18.3 % — ABNORMAL HIGH (ref 11.5–15.5)
WBC: 3.5 10*3/uL — AB (ref 4.0–10.5)

## 2016-08-01 LAB — I-STAT CHEM 8, ED
BUN: 14 mg/dL (ref 6–20)
CALCIUM ION: 1.19 mmol/L (ref 1.15–1.40)
CHLORIDE: 105 mmol/L (ref 101–111)
Creatinine, Ser: 1.1 mg/dL (ref 0.61–1.24)
GLUCOSE: 123 mg/dL — AB (ref 65–99)
HEMATOCRIT: 34 % — AB (ref 39.0–52.0)
HEMOGLOBIN: 11.6 g/dL — AB (ref 13.0–17.0)
Potassium: 2.7 mmol/L — CL (ref 3.5–5.1)
SODIUM: 143 mmol/L (ref 135–145)
TCO2: 25 mmol/L (ref 0–100)

## 2016-08-01 LAB — URINALYSIS, ROUTINE W REFLEX MICROSCOPIC
BILIRUBIN URINE: NEGATIVE
Glucose, UA: NEGATIVE mg/dL
Ketones, ur: NEGATIVE mg/dL
NITRITE: POSITIVE — AB
PH: 6 (ref 5.0–8.0)
Protein, ur: 30 mg/dL — AB
SPECIFIC GRAVITY, URINE: 1.019 (ref 1.005–1.030)

## 2016-08-01 LAB — COMPREHENSIVE METABOLIC PANEL
ALT: 10 U/L — ABNORMAL LOW (ref 17–63)
ANION GAP: 11 (ref 5–15)
AST: 30 U/L (ref 15–41)
Albumin: 3.3 g/dL — ABNORMAL LOW (ref 3.5–5.0)
Alkaline Phosphatase: 52 U/L (ref 38–126)
BUN: 12 mg/dL (ref 6–20)
CHLORIDE: 106 mmol/L (ref 101–111)
CO2: 23 mmol/L (ref 22–32)
Calcium: 9.1 mg/dL (ref 8.9–10.3)
Creatinine, Ser: 1.13 mg/dL (ref 0.61–1.24)
GFR calc non Af Amer: 58 mL/min — ABNORMAL LOW (ref >60)
Glucose, Bld: 127 mg/dL — ABNORMAL HIGH (ref 65–99)
Potassium: 2.7 mmol/L — CL (ref 3.5–5.1)
SODIUM: 140 mmol/L (ref 135–145)
Total Bilirubin: 0.6 mg/dL (ref 0.3–1.2)
Total Protein: 6.6 g/dL (ref 6.5–8.1)

## 2016-08-01 LAB — PROTIME-INR
INR: 3.83
PROTHROMBIN TIME: 38.6 s — AB (ref 11.4–15.2)

## 2016-08-01 MED ORDER — SODIUM CHLORIDE 0.9 % IV SOLN
1000.0000 mL | INTRAVENOUS | Status: DC
  Administered 2016-08-01: 1000 mL via INTRAVENOUS

## 2016-08-01 MED ORDER — IOPAMIDOL (ISOVUE-300) INJECTION 61%
INTRAVENOUS | Status: AC
  Administered 2016-08-01: 100 mL
  Filled 2016-08-01: qty 100

## 2016-08-01 MED ORDER — SODIUM CHLORIDE 0.9 % IV SOLN
Freq: Once | INTRAVENOUS | Status: AC
  Administered 2016-08-02: via INTRAVENOUS
  Filled 2016-08-01: qty 1000

## 2016-08-01 MED ORDER — CEFTRIAXONE SODIUM 1 G IJ SOLR
1.0000 g | Freq: Once | INTRAMUSCULAR | Status: AC
  Administered 2016-08-02: 1 g via INTRAVENOUS
  Filled 2016-08-01: qty 10

## 2016-08-01 MED ORDER — SODIUM CHLORIDE 0.9 % IV SOLN: 1000.0000 mL | INTRAVENOUS | Status: DC

## 2016-08-01 MED ORDER — MORPHINE SULFATE (PF) 4 MG/ML IV SOLN
4.0000 mg | Freq: Once | INTRAVENOUS | Status: AC
  Administered 2016-08-02: 4 mg via INTRAVENOUS
  Filled 2016-08-01: qty 1

## 2016-08-01 NOTE — ED Triage Notes (Signed)
Per daughter, pt has been lethargic today and recently treated for a UTI. Hx of dementia. Pt is weak when standing and had to be assisted with two staff members. Pt also c/o pressure in bladder. Per daughter pt had a mucus bowel movement on Friday, last bowel movement possibly 4 days ago. Abdomen distended

## 2016-08-01 NOTE — ED Notes (Signed)
Pt transported to CT ?

## 2016-08-01 NOTE — ED Provider Notes (Signed)
[facility] DEPT Provider Note   CSN: [id number] Arrival date & time: 08/01/16  2200     History   Chief Complaint Chief Complaint  Patient presents with  . Altered Mental Status    HPI [name] [name] is a 82 y.o. male with a hx of Anxiety, coronary artery disease, Parkinson's, dementia, depression, GERD, hypertension, PE (on Coumadin) prostate cancer is brought to the Emergency Department by his daughter who reports she has been complaining of lower abdominal and low back pain since yesterday. She reports he is more "delirious" tonight.  Family reports that the patient lives with her and there is a CNA who comes to check on him regularly. She reports his last bowel movement was 5-8 days ago. It is unknown if he is having flatus.  LEVEL 5 CAVEAT for AMS  Patient's daughter at bedside reports that he is a full code.  Record review shows that patient was admitted on 07/09/2016 for pyelonephritis and altered mental status. CT scan of his abdomen at that time showed air-filled colon which might predispose him to volvulus. He was discharged home on 07/13/2016.   The history is provided by medical records, a caregiver and a relative. The history is limited by the condition of the patient. No language interpreter was used.    Past Medical History:  Diagnosis Date  . Anxiety   . Coronary artery disease, non-occlusive    with history of MI; Cath 2008 w multivessel nonobstructive CAD  . Dementia   . Depression   . GERD (gastroesophageal reflux disease)   . Hyperlipidemia   . Hypertension   . PE (pulmonary embolism)    unprovoked PE completed 6 months of warfarin, warfarin d/ced 02/12/2010, repeat PE 07/10/2010 after a long car ride and now on lifelong coumadin.  . Pituitary microadenoma ([place])    incidental finding CT 12/2009  . Prostate cancer ([place])   . Scoliosis     Patient Active Problem List   Diagnosis Date Noted  . Hypernatremia 07/15/2016  . Diverticulosis 07/15/2016    . Pyelonephritis 07/09/2016  . Unspecified abnormalities of breathing 07/09/2016  . Delirium   . Statin myopathy 03/15/2016  . Ileus ([place]) 09/15/2015  . Diarrhea   . Vision changes 08/11/2015  . Parkinsonian syndrome ([place]) 08/11/2015  . Finger injury 07/28/2015  . Leukopenia 04/16/2015  . Normocytic anemia, not due to blood loss 04/16/2015  . Chronic systolic heart failure ([place]) 04/16/2015  . Falls frequently 04/16/2015  . Onychomycosis 04/15/2015  . CAD (coronary artery disease) 01/16/2015  . Faintness   . 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 Syncope 12/07/2014  . Healthcare maintenance 10/10/2014  . Somnolence, daytime 10/01/2014  . Acute pain of right hip 02/13/2014  . Lumbosacral spondylosis without myelopathy 09/24/2013  . Bradycardia 02/20/2013  . Generalized ischemic cerebrovascular disease 02/05/2013  . Depression 01/25/2013  . Penile bleeding 01/19/2013  . Constipation 01/20/2012  . Insomnia 01/20/2012  . AKI (acute kidney injury) ([place]) 11/08/2011  . Alzheimer's dementia 08/17/2011  . Pulmonary embolism ([place]) 09/18/2010  . Encounter for long-term use of antiplatelets/antithrombotics 09/18/2010  . DJD (degenerative joint disease) 08/06/2010  . ADENOCARCINOMA, PROSTATE, HX OF 08/06/2010  . HLD (hyperlipidemia) 10/15/2009  . Essential hypertension, benign 10/15/2009  . GERD 10/15/2009    Past Surgical History:  Procedure Laterality Date  . LEFT HEART CATHETERIZATION WITH CORONARY ANGIOGRAM N/A 12/10/2014   Procedure: LEFT HEART CATHETERIZATION WITH CORONARY ANGIOGRAM;  Surgeon: [name], MD;  Location[facility] C[facility] LAB;  Service: Cardiovascular;  Laterality: N/A;  . LOOP RECORDER IMPLANT N/A 12/16/2014   Procedure: LOOP RECORDER IMPLANT;  Surgeon: [name], MD;  Location: [facility] CATH LAB;  Service: Cardiovascular;  Laterality: N/A;  . PROSTATECTOMY         Home Medications    Prior to Admission medications   Medication Sig Start Date End Date Taking?  Authorizing Provider  acetaminophen (TYLENOL) 500 MG tablet Take 500 mg by mouth every 4 (four) hours as needed for mild pain or headache.   Yes Historical Provider, MD  amLODipine (NORVASC) 2.5 MG tablet TAKE 1 TABLET (2.5 MG TOTAL) BY MOUTH DAILY. Patient taking differently: Take 2.5 mg by mouth daily.  07/08/15  Yes [name], MD  aspirin 81 MG chewable tablet Chew 81 mg by mouth daily.   Yes Historical Provider, MD  b complex vitamins tablet Take 1 tablet by mouth every morning.    Yes Historical Provider, MD  Ca Phosphate-Cholecalciferol (CALCIUM/VITAMIN D3 GUMMIES) 250-350 MG-UNIT CHEW Chew 1 Dose by mouth 2 (two) times daily. 07/08/16  Yes [name], DO  carbidopa-levodopa (SINEMET IR) 25-100 MG tablet Take 1 tablet by mouth 4 (four) times daily. 07/15/16  Yes [name], DO  fish oil-omega-3 fatty acids 1000 MG capsule Take 2 g by mouth every morning.    Yes Historical Provider, MD  memantine (NAMENDA XR) 28 MG CP24 24 hr capsule Take 28 mg by mouth every morning.   Yes Historical Provider, MD  Multiple Vitamins-Minerals (MULTIVITAMIN WITH MINERALS) tablet Take 1 tablet by mouth every morning.    Yes Historical Provider, MD  polyethylene glycol (MIRALAX / GLYCOLAX) packet Take 17 g by mouth daily as needed. Patient taking differently: Take 17 g by mouth daily as needed for mild constipation.  07/13/16  Yes Velna Ochs, MD  QUEtiapine (SEROQUEL) 25 MG tablet Take 1 tablet (25 mg total) by mouth at bedtime. 06/21/16  Yes Garvin Fila, MD  senna (SENOKOT) 8.6 MG TABS tablet Take 1 tablet (8.6 mg total) by mouth daily as needed for mild constipation. 07/13/16  Yes Velna Ochs, MD  vitamin C (ASCORBIC ACID) 500 MG tablet Take 500 mg by mouth every morning.    Yes Historical Provider, MD  warfarin (COUMADIN) 5 MG tablet TAKE AS DIRECTED. Patient taking differently: Take 2.5mg  on Monday, Wednesday and Saturday. All other days take 5mg . 07/22/16  Yes [name], DO    memantine (NAMENDA) 10 MG tablet Take 1 tablet (10 mg total) by mouth 2 (two) times daily. Patient not taking: Reported on 08/01/2016 07/27/16   Garvin Fila, MD    Family History Family History  Problem Relation Age of Onset  . Hypertension Father     Passed away from cerebral hemorrhage at age of 74.  [name] Dementia Mother     Passed away  . Hypertension Child     4 adult children  . Cervical cancer Other   . Lung cancer Other   . Stroke Other   . Heart attack Neg Hx     Social History Social History  Substance Use Topics  . Smoking status: Never Smoker  . Smokeless tobacco: Never Used  . Alcohol use No     Allergies   Aricept [donepezil hcl]; Other; and Shellfish-derived products   Review of Systems Review of Systems  Unable to perform ROS: Mental status change     Physical Exam Updated Vital Signs BP (!) 149/105   Pulse 72   Temp 97.9  F (36.6 C) (Rectal)   Resp 19   SpO2 100%   Physical Exam  Constitutional: He appears well-developed and well-nourished. No distress.  Awake, alert, ill-appearing  HENT:  Head: Normocephalic and atraumatic.  Mouth/Throat: No oropharyngeal exudate.  Eyes: Conjunctivae are normal. No scleral icterus.  Neck: Normal range of motion. Neck supple. No JVD present.  Cardiovascular: Normal rate, regular rhythm and intact distal pulses.   Pulses:      Radial pulses are 2+ on the right side, and 2+ on the left side.       Dorsalis pedis pulses are 2+ on the right side, and 2+ on the left side.  Pulmonary/Chest: Effort normal and breath sounds normal. No respiratory distress. He has no wheezes.  Equal chest expansion  Abdominal: He exhibits distension. He exhibits no mass. Bowel sounds are absent. There is tenderness. There is rigidity.  Musculoskeletal: He exhibits no edema.  Neurological:  Patient intermittently follows some commands  Skin: Skin is warm and dry. He is not diaphoretic.  Nursing note and vitals  reviewed.    ED Treatments / Results  Labs (all labs ordered are listed, but only abnormal results are displayed) Labs Reviewed  COMPREHENSIVE METABOLIC PANEL - Abnormal; Notable for the following:       Result Value   Potassium 2.7 (*)    Glucose, Bld 127 (*)    Albumin 3.3 (*)    ALT 10 (*)    GFR calc non Af Amer 58 (*)    All other components within normal limits  CBC WITH DIFFERENTIAL/PLATELET - Abnormal; Notable for the following:    WBC 3.5 (*)    RBC 3.69 (*)    Hemoglobin 10.8 (*)    HCT 33.7 (*)    RDW 18.3 (*)    Neutro Abs 1.6 (*)    All other components within normal limits  URINALYSIS, ROUTINE W REFLEX MICROSCOPIC (NOT AT [facility]) - Abnormal; Notable for the following:    Color, Urine AMBER (*)    APPearance TURBID (*)    Hgb urine dipstick MODERATE (*)    Protein, ur 30 (*)    Nitrite POSITIVE (*)    Leukocytes, UA LARGE (*)    All other components within normal limits  PROTIME-INR - Abnormal; Notable for the following:    Prothrombin Time 38.6 (*)    All other components within normal limits  URINE MICROSCOPIC-ADD ON - Abnormal; Notable for the following:    Squamous Epithelial / LPF 0-5 (*)    Bacteria, UA MANY (*)    Casts HYALINE CASTS (*)    All other components within normal limits  I-STAT CHEM 8, ED - Abnormal; Notable for the following:    Potassium 2.7 (*)    Glucose, Bld 123 (*)    Hemoglobin 11.6 (*)    HCT 34.0 (*)    All other components within normal limits  CULTURE, BLOOD (ROUTINE X 2)  CULTURE, BLOOD (ROUTINE X 2)  URINE CULTURE  I-STAT CG4 LACTIC ACID, ED  I-STAT TROPOININ, ED  I-STAT CG4 LACTIC ACID, ED    EKG  EKG Interpretation  Date/Time:  Sunday August 01 2016 22:13:43 EST Ventricular Rate:  82 PR Interval:    QRS Duration: 161 QT Interval:  435 QTC Calculation: 470 R Axis:   -24 Text Interpretation:  sinus rhythm with PVCs Right bundle branch block Confirmed by YAO  MD, DAVID ([id number]) on 08/01/2016 10:45:01 PM        Radiology Ct  Head Wo Contrast  Result Date: 08/01/2016 CLINICAL DATA:  Altered mental status. EXAM: CT HEAD WITHOUT CONTRAST TECHNIQUE: Contiguous axial images were obtained from the base of the skull through the vertex without intravenous contrast. COMPARISON:  03/08/2016 FINDINGS: Brain: No evidence of acute infarction, hemorrhage, hydrocephalus, extra-axial collection or mass lesion/mass effect. There is moderate generalized atrophy. There is extensive white matter hypodensity which may represent chronic small vessel ischemic disease. There is remote lacunar infarction in the right thalamus. These findings are unchanged. Vascular: No hyperdense vessel or unexpected calcification. Skull: Normal. Negative for fracture or focal lesion. Sinuses/Orbits: No acute finding. Other: None. IMPRESSION: No acute intracranial findings. There is moderate generalized atrophy and chronic appearing white matter hypodensities which likely represent small vessel ischemic disease. Remote right thalamus lacunar infarction. Electronically Signed   By: Andreas Newport M.D.   On: 08/01/2016 23:43   Ct Abdomen Pelvis W Contrast  Result Date: 08/02/2016 CLINICAL DATA:  Acute onset of generalized abdominal pain and distention. Initial encounter. EXAM: CT ABDOMEN AND PELVIS WITH CONTRAST TECHNIQUE: Multidetector CT imaging of the abdomen and pelvis was performed using the standard protocol following bolus administration of intravenous contrast. CONTRAST:  199mL ISOVUE-300 IOPAMIDOL (ISOVUE-300) INJECTION 61% COMPARISON:  CT of the abdomen and pelvis from 07/09/2016 FINDINGS: Lower chest: Trace right-sided pleural fluid is noted. Mild bibasilar opacities likely reflect atelectasis. Hepatobiliary: Scattered small hypodensities are noted within the liver, measuring up to 6 mm in size. The gallbladder is grossly unremarkable. The common bile duct remains normal in caliber. Pancreas: The pancreas is within normal limits. Spleen:  The spleen is unremarkable in appearance. Adrenals/Urinary Tract: The adrenal glands are unremarkable in appearance. Mild bilateral renal scarring is noted, more prominent on the left. A 3.6 cm right renal cyst is noted. There is no evidence of hydronephrosis. No renal or ureteral stones are identified. No significant perinephric stranding is seen. Stomach/Bowel: Note is made of sigmoid volvulus, with marked dilatation of the sigmoid colon and minimal associated pneumatosis. The sigmoid colon twists about the mesenteric axis. Wall thickening is noted along the distal sigmoid colon and rectum, raising concern for proctitis. Associated presacral stranding is noted. The more proximal colon is grossly unremarkable. The appendix is not definitely characterized; there is no evidence for appendicitis. Visualized small bowel loops are grossly unremarkable, though difficult to fully assess due to dilated colonic loops. The stomach is largely decompressed. There is question of wall thickening along the antrum of the stomach, which could reflect mild gastritis. Would correlate for any associated symptoms. Vascular/Lymphatic: Minimal calcification is seen along the distal abdominal aorta and its branches. The inferior vena cava is grossly unremarkable. No retroperitoneal lymphadenopathy is seen. No pelvic sidewall lymphadenopathy is identified. Reproductive: The bladder is decompressed. Diffuse bladder wall thickening may reflect cystitis. The patient is status post prostatectomy. Other: No additional soft tissue abnormalities are seen. Musculoskeletal: No acute osseous abnormalities are identified. Multilevel vacuum phenomenon is noted along the lumbar spine, with endplate sclerosis. The visualized musculature is unremarkable in appearance. IMPRESSION: 1. Sigmoid volvulus, with marked dilatation of the sigmoid colon and minimal associated pneumatosis. The sigmoid colon twists about the mesenteric axis. This is new from the  prior study and appears to cause some degree of obstruction. 2. Wall thickening along the distal sigmoid colon and rectum, concerning for proctitis, similar in appearance to the prior study. Further evaluation could be considered to exclude underlying mass. Associated presacral stranding noted. 3. Diffuse bladder wall thickening may reflect cystitis. 4. Question  of wall thickening along the antrum of the stomach, which could reflect mild gastritis. Would correlate for any associated symptoms. 5. Trace right-sided pleural fluid. Small bibasilar airspace opacities likely reflect atelectasis. 6. Scattered small hypodensities within the liver, measuring up to 6 mm in size. 7. Mild bilateral renal scarring, more prominent on the left. Right renal cyst noted. 8. Degenerative change along the lumbar spine. These results were called by telephone at the time of interpretation on 08/02/2016 at 12:02 am to [facility], who verbally acknowledged these results. Electronically Signed   By: Garald Balding M.D.   On: 08/02/2016 00:05   Dg Chest Port 1 View  Result Date: 08/01/2016 CLINICAL DATA:  Confusion.  Abdominal distention. EXAM: PORTABLE CHEST 1 VIEW COMPARISON:  07/10/2016 FINDINGS: Shallow inspiration. No confluent airspace consolidation. No large effusion. Normal pulmonary vasculature. Visible upper abdomen demonstrates stool and air distention of the colon. IMPRESSION: No acute cardiopulmonary findings. Moderate colonic distention with stool and air. Electronically Signed   By: Andreas Newport M.D.   On: 08/01/2016 22:36    Procedures Procedures (including critical care time)  Medications Ordered in ED Medications  0.9 %  sodium chloride infusion (1,000 mLs Intravenous New Bag/Given 08/01/16 2240)  sodium chloride 0.9 % 1,000 mL with potassium chloride 80 mEq infusion ( Intravenous Given 08/02/16 0013)  iopamidol (ISOVUE-300) 61 % injection (100 mLs  Contrast Given 08/01/16 2300)  morphine 4  MG/ML injection 4 mg (4 mg Intravenous Given 08/02/16 0006)  cefTRIAXone (ROCEPHIN) 1 g in dextrose 5 % 50 mL IVPB (0 g Intravenous Stopped 08/02/16 0039)     Initial Impression / Assessment and Plan / ED Course  I have reviewed the triage vital signs and the nursing notes.  Pertinent labs & imaging results that were available during my care of the patient were reviewed by me and considered in my medical decision making (see chart for details).  Clinical Course as of Aug 02 124  Sun Aug 01, 2016  2218 Anemia Hemoglobin: (!) 10.8 [HM]  2220 Leukopenia (mild) WBC: (!) 3.5 [HM]  2230 Slightly above therapeutic INR: 3.83 [HM]  2236 Hypokalemia; significant.  Will repleate via IV. Potassium: (!!) 2.7 [HM]  2236 WNL Creatinine: 1.10 [HM]  2237 Mild anemia Hemoglobin: (!) 11.6 [HM]  2245 WNL Lactic Acid, Venous: 1.89 [HM]  2330 Evidence of UTI.  Urine culture from previous UTI with E. Coli susceptible to everything.  Will give Rocephin Leukocytes, UA: (!) LARGE [HM]  Mon Aug 02, 2016  0033 Sigmoid volvulus, proctitis and cystitis. CT ABDOMEN PELVIS W CONTRAST [HM]  [id number] Discussed with Dr. Daisey Must who will evaluate tomorrow.  Will admit to medicine.  [HM]  0120 Discussed with internal medicine teaching service who will admit.  [HM]  0124 The patient was discussed with and seen by Dr. Darl Householder who agrees with the treatment plan.   [HM]    Clinical Course User Index [HM] Jarrett Soho Seddrick Flax, PA-C   Impression presents with altered mild status and abdominal pain for the last 24 hours. Patient profoundly hypokalemic with IV replacement.  CT scan shows sigmoid volvulus. Persistent UTI.  Normal lactic acid. No hypertension. No evidence of sepsis. NG tube placed. Patient will be admitted.  Final Clinical Impressions(s) / ED Diagnoses   Final diagnoses:  Sigmoid volvulus ([place])  Urinary tract infection without hematuria, site unspecified  Altered mental status, unspecified altered mental status type   Proctitis    New Prescriptions New Prescriptions   No medications on  file     Abigail Butts, PA-C 08/02/16 0125    Drenda Freeze, MD 08/05/16 1039

## 2016-08-02 ENCOUNTER — Inpatient Hospital Stay (HOSPITAL_COMMUNITY): Payer: Commercial Managed Care - HMO

## 2016-08-02 ENCOUNTER — Encounter (HOSPITAL_COMMUNITY)
Admission: EM | Disposition: A | Discharge status: Home / Self Care | Payer: Self-pay | Source: Home / Self Care | Attending: Internal Medicine

## 2016-08-02 ENCOUNTER — Encounter (HOSPITAL_COMMUNITY): Payer: Self-pay | Admitting: *Deleted

## 2016-08-02 ENCOUNTER — Emergency Department (HOSPITAL_COMMUNITY): Payer: Commercial Managed Care - HMO

## 2016-08-02 DIAGNOSIS — I2699 Other pulmonary embolism without acute cor pulmonale: Secondary | ICD-10-CM | POA: Diagnosis present

## 2016-08-02 DIAGNOSIS — Z8546 Personal history of malignant neoplasm of prostate: Secondary | ICD-10-CM | POA: Diagnosis not present

## 2016-08-02 DIAGNOSIS — I5042 Chronic combined systolic (congestive) and diastolic (congestive) heart failure: Secondary | ICD-10-CM

## 2016-08-02 DIAGNOSIS — F329 Major depressive disorder, single episode, unspecified: Secondary | ICD-10-CM | POA: Diagnosis present

## 2016-08-02 DIAGNOSIS — I110 Hypertensive heart disease with heart failure: Secondary | ICD-10-CM | POA: Diagnosis present

## 2016-08-02 DIAGNOSIS — Z9889 Other specified postprocedural states: Secondary | ICD-10-CM

## 2016-08-02 DIAGNOSIS — Z888 Allergy status to other drugs, medicaments and biological substances status: Secondary | ICD-10-CM

## 2016-08-02 DIAGNOSIS — K219 Gastro-esophageal reflux disease without esophagitis: Secondary | ICD-10-CM | POA: Diagnosis present

## 2016-08-02 DIAGNOSIS — I252 Old myocardial infarction: Secondary | ICD-10-CM | POA: Diagnosis not present

## 2016-08-02 DIAGNOSIS — F0280 Dementia in other diseases classified elsewhere without behavioral disturbance: Secondary | ICD-10-CM | POA: Diagnosis present

## 2016-08-02 DIAGNOSIS — Z7982 Long term (current) use of aspirin: Secondary | ICD-10-CM | POA: Diagnosis not present

## 2016-08-02 DIAGNOSIS — E876 Hypokalemia: Secondary | ICD-10-CM | POA: Diagnosis present

## 2016-08-02 DIAGNOSIS — M419 Scoliosis, unspecified: Secondary | ICD-10-CM | POA: Diagnosis present

## 2016-08-02 DIAGNOSIS — Z9109 Other allergy status, other than to drugs and biological substances: Secondary | ICD-10-CM

## 2016-08-02 DIAGNOSIS — Z9079 Acquired absence of other genital organ(s): Secondary | ICD-10-CM | POA: Diagnosis not present

## 2016-08-02 DIAGNOSIS — I2510 Atherosclerotic heart disease of native coronary artery without angina pectoris: Secondary | ICD-10-CM | POA: Diagnosis present

## 2016-08-02 DIAGNOSIS — K562 Volvulus: Secondary | ICD-10-CM | POA: Diagnosis present

## 2016-08-02 DIAGNOSIS — K5939 Other megacolon: Secondary | ICD-10-CM | POA: Diagnosis present

## 2016-08-02 DIAGNOSIS — Z91013 Allergy to seafood: Secondary | ICD-10-CM

## 2016-08-02 DIAGNOSIS — Z86711 Personal history of pulmonary embolism: Secondary | ICD-10-CM | POA: Diagnosis not present

## 2016-08-02 DIAGNOSIS — I493 Ventricular premature depolarization: Secondary | ICD-10-CM | POA: Diagnosis present

## 2016-08-02 DIAGNOSIS — G309 Alzheimer's disease, unspecified: Secondary | ICD-10-CM | POA: Diagnosis present

## 2016-08-02 DIAGNOSIS — B9689 Other specified bacterial agents as the cause of diseases classified elsewhere: Secondary | ICD-10-CM | POA: Diagnosis not present

## 2016-08-02 DIAGNOSIS — G20 Parkinson's disease: Secondary | ICD-10-CM | POA: Diagnosis present

## 2016-08-02 DIAGNOSIS — N390 Urinary tract infection, site not specified: Secondary | ICD-10-CM | POA: Diagnosis present

## 2016-08-02 DIAGNOSIS — Z7901 Long term (current) use of anticoagulants: Secondary | ICD-10-CM | POA: Diagnosis not present

## 2016-08-02 DIAGNOSIS — R140 Abdominal distension (gaseous): Secondary | ICD-10-CM | POA: Diagnosis not present

## 2016-08-02 DIAGNOSIS — K6289 Other specified diseases of anus and rectum: Secondary | ICD-10-CM | POA: Diagnosis present

## 2016-08-02 DIAGNOSIS — Z8619 Personal history of other infectious and parasitic diseases: Secondary | ICD-10-CM

## 2016-08-02 DIAGNOSIS — E870 Hyperosmolality and hypernatremia: Secondary | ICD-10-CM | POA: Diagnosis not present

## 2016-08-02 HISTORY — PX: FLEXIBLE SIGMOIDOSCOPY: SHX5431

## 2016-08-02 HISTORY — DX: Volvulus: K56.2

## 2016-08-02 LAB — BASIC METABOLIC PANEL
ANION GAP: 9 (ref 5–15)
BUN: 10 mg/dL (ref 6–20)
CHLORIDE: 111 mmol/L (ref 101–111)
CO2: 23 mmol/L (ref 22–32)
Calcium: 8.9 mg/dL (ref 8.9–10.3)
Creatinine, Ser: 0.91 mg/dL (ref 0.61–1.24)
GFR calc Af Amer: >60 mL/min (ref >60)
Glucose, Bld: 93 mg/dL (ref 65–99)
POTASSIUM: 3.5 mmol/L (ref 3.5–5.1)
SODIUM: 143 mmol/L (ref 135–145)

## 2016-08-02 LAB — CBC
HCT: 28.8 % — ABNORMAL LOW (ref 39.0–52.0)
Hemoglobin: 9.2 g/dL — ABNORMAL LOW (ref 13.0–17.0)
MCH: 29.5 pg (ref 26.0–34.0)
MCHC: 31.9 g/dL (ref 30.0–36.0)
MCV: 92.3 fL (ref 78.0–100.0)
PLATELETS: 188 10*3/uL (ref 150–400)
RBC: 3.12 MIL/uL — ABNORMAL LOW (ref 4.22–5.81)
RDW: 18.6 % — AB (ref 11.5–15.5)
WBC: 3.7 10*3/uL — ABNORMAL LOW (ref 4.0–10.5)

## 2016-08-02 LAB — COMPREHENSIVE METABOLIC PANEL
ALK PHOS: 42 U/L (ref 38–126)
ALT: 6 U/L — ABNORMAL LOW (ref 17–63)
ANION GAP: 5 (ref 5–15)
AST: 24 U/L (ref 15–41)
Albumin: 2.9 g/dL — ABNORMAL LOW (ref 3.5–5.0)
BILIRUBIN TOTAL: 0.5 mg/dL (ref 0.3–1.2)
BUN: 10 mg/dL (ref 6–20)
CALCIUM: 8.2 mg/dL — AB (ref 8.9–10.3)
CO2: 22 mmol/L (ref 22–32)
Chloride: 116 mmol/L — ABNORMAL HIGH (ref 101–111)
Creatinine, Ser: 0.93 mg/dL (ref 0.61–1.24)
GFR calc non Af Amer: >60 mL/min (ref >60)
GLUCOSE: 99 mg/dL (ref 65–99)
Potassium: >7.5 mmol/L (ref 3.5–5.1)
Sodium: 143 mmol/L (ref 135–145)
TOTAL PROTEIN: 6 g/dL — AB (ref 6.5–8.1)

## 2016-08-02 LAB — I-STAT CG4 LACTIC ACID, ED: Lactic Acid, Venous: 1.07 mmol/L (ref 0.5–1.9)

## 2016-08-02 LAB — PROTIME-INR
INR: 3.01
Prothrombin Time: 31.8 seconds — ABNORMAL HIGH (ref 11.4–15.2)

## 2016-08-02 SURGERY — SIGMOIDOSCOPY, FLEXIBLE

## 2016-08-02 MED ORDER — DEXTROSE 5 % IV SOLN
1.0000 g | INTRAVENOUS | Status: DC
  Administered 2016-08-02 – 2016-08-03 (×2): 1 g via INTRAVENOUS
  Filled 2016-08-02 (×2): qty 10

## 2016-08-02 MED ORDER — HYDRALAZINE HCL 20 MG/ML IJ SOLN
5.0000 mg | Freq: Once | INTRAMUSCULAR | Status: AC
  Administered 2016-08-02: 5 mg via INTRAVENOUS
  Filled 2016-08-02: qty 1

## 2016-08-02 MED ORDER — SODIUM CHLORIDE 0.9% FLUSH
3.0000 mL | Freq: Two times a day (BID) | INTRAVENOUS | Status: DC
  Administered 2016-08-02 – 2016-08-08 (×12): 3 mL via INTRAVENOUS

## 2016-08-02 MED ORDER — VITAMIN K1 10 MG/ML IJ SOLN
2.5000 mg | Freq: Once | INTRAVENOUS | Status: AC
  Administered 2016-08-02: 2.5 mg via INTRAVENOUS
  Filled 2016-08-02: qty 0.25

## 2016-08-02 MED ORDER — MORPHINE SULFATE (PF) 4 MG/ML IV SOLN
2.0000 mg | INTRAVENOUS | Status: DC | PRN
  Administered 2016-08-03 – 2016-08-06 (×4): 2 mg via INTRAVENOUS
  Filled 2016-08-02 (×4): qty 1

## 2016-08-02 MED ORDER — AMLODIPINE BESYLATE 2.5 MG PO TABS
2.5000 mg | ORAL_TABLET | Freq: Every day | ORAL | Status: DC
  Filled 2016-08-02: qty 1

## 2016-08-02 NOTE — Progress Notes (Signed)
ANTICOAGULATION CONSULT NOTE - Initial Consult  Pharmacy Consult for Coumadin Indication: h/o PE  Allergies  Allergen Reactions  . Aricept [Donepezil Hcl] Other (See Comments)    Symptomatic Bradycardia.  . Other Other (See Comments)    Shrimp gives him gout  . Shellfish-Derived Products Other (See Comments)    Causes a flare up with Gout    Patient Measurements: Height: 5\' 9"  (175.3 cm) Weight: 157 lb 9.6 oz (71.5 kg) IBW/kg (Calculated) : 70.7  Vital Signs: Temp: 97.9 F (36.6 C) (11/12 2307) Temp Source: Rectal (11/12 2307) BP: 139/83 (11/13 0315) Pulse Rate: 73 (11/13 0315)  Labs:  Recent Labs  08/01/16 2218 08/01/16 2234  HGB 10.8* 11.6*  HCT 33.7* 34.0*  PLT 227  --   LABPROT 38.6*  --   INR 3.83  --   CREATININE 1.13 1.10    Estimated Creatinine Clearance: 50.9 mL/min (by C-G formula based on SCr of 1.1 mg/dL).   Medical History: Past Medical History:  Diagnosis Date  . Anxiety   . Coronary artery disease, non-occlusive    with history of MI; Cath 2008 w multivessel nonobstructive CAD  . Dementia   . Depression   . GERD (gastroesophageal reflux disease)   . Hyperlipidemia   . Hypertension   . PE (pulmonary embolism)    unprovoked PE completed 6 months of warfarin, warfarin d/ced 02/12/2010, repeat PE 07/10/2010 after a long car ride and now on lifelong coumadin.  . Pituitary microadenoma ([place])    incidental finding CT 12/2009  . Prostate cancer ([place])   . Scoliosis     Medications:  No current facility-administered medications on file prior to encounter.    Current Outpatient Prescriptions on File Prior to Encounter  Medication Sig Dispense Refill  . acetaminophen (TYLENOL) 500 MG tablet Take 500 mg by mouth every 4 (four) hours as needed for mild pain or headache.    [name] amLODipine (NORVASC) 2.5 MG tablet TAKE 1 TABLET (2.5 MG TOTAL) BY MOUTH DAILY. (Patient taking differently: Take 2.5 mg by mouth daily. ) 90 tablet 3  . b complex vitamins  tablet Take 1 tablet by mouth every morning.     . Ca Phosphate-Cholecalciferol (CALCIUM/VITAMIN D3 GUMMIES) 250-350 MG-UNIT CHEW Chew 1 Dose by mouth 2 (two) times daily. 180 tablet 3  . carbidopa-levodopa (SINEMET IR) 25-100 MG tablet Take 1 tablet by mouth 4 (four) times daily. 120 tablet 2  . fish oil-omega-3 fatty acids 1000 MG capsule Take 2 g by mouth every morning.     . Multiple Vitamins-Minerals (MULTIVITAMIN WITH MINERALS) tablet Take 1 tablet by mouth every morning.     . polyethylene glycol (MIRALAX / GLYCOLAX) packet Take 17 g by mouth daily as needed. (Patient taking differently: Take 17 g by mouth daily as needed for mild constipation. ) 14 each 1  . QUEtiapine (SEROQUEL) 25 MG tablet Take 1 tablet (25 mg total) by mouth at bedtime. 90 tablet 4  . senna (SENOKOT) 8.6 MG TABS tablet Take 1 tablet (8.6 mg total) by mouth daily as needed for mild constipation. 120 each 0  . vitamin C (ASCORBIC ACID) 500 MG tablet Take 500 mg by mouth every morning.     . warfarin (COUMADIN) 5 MG tablet TAKE AS DIRECTED. (Patient taking differently: Take 2.5mg  on Monday, Wednesday and Saturday. All other days take 5mg .) 40 tablet 2  . memantine (NAMENDA) 10 MG tablet Take 1 tablet (10 mg total) by mouth 2 (two) times daily. (Patient not taking:  Reported on 08/01/2016) 60 tablet 3     Assessment: 82 y.o. male admitted with abdominal pain, h/o PE and Coumadin on hold.  INR supratherapeutic tonight and Vit K given in anticipation of possible EGD today.     Goal of Therapy:  INR 2-3 Monitor platelets by anticoagulation protocol: Yes   Plan:  F/U plan for anticoagulation  Crosby Bevan, Bronson Curb 08/02/2016,4:29 AM

## 2016-08-02 NOTE — ED Notes (Signed)
Attempted report x1. 

## 2016-08-02 NOTE — Progress Notes (Signed)
Addendum to Consult Note:  Pt has h/o recurr PE's and chronically on coumadin and was supra-therapeutic at time of adm; however, since receiving Vit K, INR has come down to 3.0 and pt is deemed appropriate for limiting colonoscopy/flex sig.  Cleotis Nipper, M.D. Pager [phone] If no answer or after 5 PM call [phone]

## 2016-08-02 NOTE — Consult Note (Signed)
Referring Provider: Dr. Joni Reining (MTS) Primary Care Physician:  [name], DO Primary Gastroenterologist:  None (unassigned)  Reason for Consultation:  Sigmoid volvulus  HPI: [name] [name] is a 82 y.o. male with dementia and significant Parkinson's disease, cared for at home by his daughter, [name], with significant decline over the past year, barely ambulatory with assistance, approximately a month status post hospitalization for urosepsis, readmitted through the emergency room last night with failure to thrive over the past couple of days, diminished mental status, and no bowel movements for the past 5-7 days.   He was noted to have significant abdominal distention and an abdominal CT (reviewed) shows evidence of sigmoid volvulus.   Patient came in with a severely low potassium of 2.7.   White count on the low side, no vomiting or small bowel obstruction. Patient was not noted to be in acute distress.   Past Medical History:  Diagnosis Date  . Anxiety   . Coronary artery disease, non-occlusive    with history of MI; Cath 2008 w multivessel nonobstructive CAD  . Dementia   . Depression   . GERD (gastroesophageal reflux disease)   . Hyperlipidemia   . Hypertension   . PE (pulmonary embolism)    unprovoked PE completed 6 months of warfarin, warfarin d/ced 02/12/2010, repeat PE 07/10/2010 after a long car ride and now on lifelong coumadin.  . Pituitary microadenoma ([place])    incidental finding CT 12/2009  . Prostate cancer ([place])   . Scoliosis     Past Surgical History:  Procedure Laterality Date  . LEFT HEART CATHETERIZATION WITH CORONARY ANGIOGRAM N/A 12/10/2014   Procedure: LEFT HEART CATHETERIZATION WITH CORONARY ANGIOGRAM;  Surgeon: [name], MD;  Location: [facility] CATH LAB;  Service: Cardiovascular;  Laterality: N/A;  . LOOP RECORDER IMPLANT N/A 12/16/2014   Procedure: LOOP RECORDER IMPLANT;  Surgeon: [name], MD;  Location[facility] C[facility];  Service:  Cardiovascular;  Laterality: N/A;  . PROSTATECTOMY      Prior to Admission medications   Medication Sig Start Date End Date Taking? Authorizing Provider  acetaminophen (TYLENOL) 500 MG tablet Take 500 mg by mouth every 4 (four) hours as needed for mild pain or headache.   Yes Historical Provider, MD  amLODipine (NORVASC) 2.5 MG tablet TAKE 1 TABLET (2.5 MG TOTAL) BY MOUTH DAILY. Patient taking differently: Take 2.5 mg by mouth daily.  07/08/15  Yes [name], MD  aspirin 81 MG chewable tablet Chew 81 mg by mouth daily.   Yes Historical Provider, MD  b complex vitamins tablet Take 1 tablet by mouth every morning.    Yes Historical Provider, MD  Ca Phosphate-Cholecalciferol (CALCIUM/VITAMIN D3 GUMMIES) 250-350 MG-UNIT CHEW Chew 1 Dose by mouth 2 (two) times daily. 07/08/16  Yes [name], DO  carbidopa-levodopa (SINEMET IR) 25-100 MG tablet Take 1 tablet by mouth 4 (four) times daily. 07/15/16  Yes [name], DO  fish oil-omega-3 fatty acids 1000 MG capsule Take 2 g by mouth every morning.    Yes Historical Provider, MD  memantine (NAMENDA XR) 28 MG CP24 24 hr capsule Take 28 mg by mouth every morning.   Yes Historical Provider, MD  Multiple Vitamins-Minerals (MULTIVITAMIN WITH MINERALS) tablet Take 1 tablet by mouth every morning.    Yes Historical Provider, MD  polyethylene glycol (MIRALAX / GLYCOLAX) packet Take 17 g by mouth daily as needed. Patient taking differently: Take 17 g by mouth daily as needed for mild constipation.  07/13/16  Yes Velna Ochs, MD  QUEtiapine (SEROQUEL) 25 MG tablet Take 1 tablet (25 mg total) by mouth at bedtime. 06/21/16  Yes Garvin Fila, MD  senna (SENOKOT) 8.6 MG TABS tablet Take 1 tablet (8.6 mg total) by mouth daily as needed for mild constipation. 07/13/16  Yes Velna Ochs, MD  vitamin C (ASCORBIC ACID) 500 MG tablet Take 500 mg by mouth every morning.    Yes Historical Provider, MD  warfarin (COUMADIN) 5 MG tablet TAKE AS  DIRECTED. Patient taking differently: Take 2.5mg  on Monday, Wednesday and Saturday. All other days take 5mg . 07/22/16  Yes [name], DO  memantine (NAMENDA) 10 MG tablet Take 1 tablet (10 mg total) by mouth 2 (two) times daily. Patient not taking: Reported on 08/01/2016 07/27/16   Garvin Fila, MD    Current Facility-Administered Medications  Medication Dose Route Frequency Provider Last Rate Last Dose  . 0.9 %  sodium chloride infusion  1,000 mL Intravenous Continuous Hannah Muthersbaugh, PA-C 50 mL/hr at 08/01/16 2240 1,000 mL at 08/01/16 2240  . cefTRIAXone (ROCEPHIN) 1 g in dextrose 5 % 50 mL IVPB  1 g Intravenous Q24H Jule Ser, DO      . morphine 4 MG/ML injection 2 mg  2 mg Intravenous Q4H PRN Jule Ser, DO      . sodium chloride flush (NS) 0.9 % injection 3 mL  3 mL Intravenous Q12H Jule Ser, DO        Allergies as of 08/01/2016 - Review Complete 08/01/2016  Allergen Reaction Noted  . Aricept [donepezil hcl] Other (See Comments) 07/24/2013  . Other Other (See Comments) 08/17/2011  . Shellfish-derived products Other (See Comments) 01/21/2016    Family History  Problem Relation Age of Onset  . Hypertension Father     Passed away from cerebral hemorrhage at age of 72.  [name] Dementia Mother     Passed away  . Hypertension Child     4 adult children  . Cervical cancer Other   . Lung cancer Other   . Stroke Other   . Heart attack Neg Hx     Social History   Social History  . Marital status: Married    Spouse name: N/A  . Number of children: 4  . Years of education: master's   Occupational History  . Retired Retired   Social History Main Topics  . Smoking status: Never Smoker  . Smokeless tobacco: Never Used  . Alcohol use No  . Drug use: No  . Sexual activity: No   Other Topics Concern  . Not on file   Social History Narrative   Lives with daughter. Wife also has severe dementia.    Drinks 1 cup of coffee a day     Review of Systems:  Although at baseline the patient never had a significant problem with constipation, it sounds as though this has been a problem recently. He was started on MiraLAX following his last hospitalization. He was also taken off Lasix and potassium supplementation recently because of "dehydration."  Physical Exam: Vital signs in last 24 hours: Temp:  [97.9 F (36.6 C)-98 F (36.7 C)] 98 F (36.7 C) (11/13 0545) Pulse Rate:  [71-91] 82 (11/13 0545) Resp:  [12-23] 18 (11/13 0545) BP: (139-165)/(83-124) 143/96 (11/13 0545) SpO2:  [93 %-100 %] 95 % (11/13 0545) Weight:  [71.5 kg (157 lb 9.6 oz)] 71.5 kg (157 lb 9.6 oz) (11/13 0349)   General: Somnolent, minimally responsive to voice, minimally  responsive to tactile stimulation, African-American elderly male, no distress.    HEENT: Conjunctiva pale. Mouth:No ulcerations or lesions.  Oropharynx dry. Neck:   No masses or thyromegaly. Lungs:   Grossly clear, but patient does not cooperate with deep breathing. No evident respiratory distress. Heart:   Regular rate and rhythm; no murmurs, clicks, rubs,  or gallops. Abdomen: Distended, firm, nontender   Msk:   Symmetrical without gross deformities. Extremities:   trace tibial edema. Neurologic: very somnolent, no obvious focal deficits Cervical Nodes:  No significant cervical adenopathy.  Intake/Output from previous day: 11/12 0701 - 11/13 0700 In: 396.7 [I.V.:316.7; NG/GT:30; IV Piggyback:50] Out: -  Intake/Output this shift: No intake/output data recorded.  Lab Results:  Recent Labs  08/01/16 2218 08/01/16 2234 08/02/16 0853  WBC 3.5*  --  3.7*  HGB 10.8* 11.6* 9.2*  HCT 33.7* 34.0* 28.8*  PLT 227  --  188   BMET  Recent Labs  08/01/16 2218 08/01/16 2234 08/02/16 0853 08/02/16 1052  NA 140 143 143 143  K 2.7* 2.7* >7.5* 3.5  CL 106 105 116* 111  CO2 23  --  22 23  GLUCOSE 127* 123* 99 93  BUN 12 14 10 10   CREATININE 1.13 1.10 0.93 0.91  CALCIUM 9.1  --  8.2* 8.9    LFT  Recent Labs  08/02/16 0853  PROT 6.0*  ALBUMIN 2.9*  AST 24  ALT 6*  ALKPHOS 42  BILITOT 0.5   PT/INR  Recent Labs  08/01/16 2218 08/02/16 0853  LABPROT 38.6* 31.8*  INR 3.83 3.01    Studies/Results: Ct Head Wo Contrast  Result Date: 08/01/2016 CLINICAL DATA:  Altered mental status. EXAM: CT HEAD WITHOUT CONTRAST TECHNIQUE: Contiguous axial images were obtained from the base of the skull through the vertex without intravenous contrast. COMPARISON:  03/08/2016 FINDINGS: Brain: No evidence of acute infarction, hemorrhage, hydrocephalus, extra-axial collection or mass lesion/mass effect. There is moderate generalized atrophy. There is extensive white matter hypodensity which may represent chronic small vessel ischemic disease. There is remote lacunar infarction in the right thalamus. These findings are unchanged. Vascular: No hyperdense vessel or unexpected calcification. Skull: Normal. Negative for fracture or focal lesion. Sinuses/Orbits: No acute finding. Other: None. IMPRESSION: No acute intracranial findings. There is moderate generalized atrophy and chronic appearing white matter hypodensities which likely represent small vessel ischemic disease. Remote right thalamus lacunar infarction. Electronically Signed   By: Andreas Newport M.D.   On: 08/01/2016 23:43   Ct Abdomen Pelvis W Contrast  Result Date: 08/02/2016 CLINICAL DATA:  Acute onset of generalized abdominal pain and distention. Initial encounter. EXAM: CT ABDOMEN AND PELVIS WITH CONTRAST TECHNIQUE: Multidetector CT imaging of the abdomen and pelvis was performed using the standard protocol following bolus administration of intravenous contrast. CONTRAST:  137mL ISOVUE-300 IOPAMIDOL (ISOVUE-300) INJECTION 61% COMPARISON:  CT of the abdomen and pelvis from 07/09/2016 FINDINGS: Lower chest: Trace right-sided pleural fluid is noted. Mild bibasilar opacities likely reflect atelectasis. Hepatobiliary: Scattered small  hypodensities are noted within the liver, measuring up to 6 mm in size. The gallbladder is grossly unremarkable. The common bile duct remains normal in caliber. Pancreas: The pancreas is within normal limits. Spleen: The spleen is unremarkable in appearance. Adrenals/Urinary Tract: The adrenal glands are unremarkable in appearance. Mild bilateral renal scarring is noted, more prominent on the left. A 3.6 cm right renal cyst is noted. There is no evidence of hydronephrosis. No renal or ureteral stones are identified. No significant perinephric stranding is seen.  Stomach/Bowel: Note is made of sigmoid volvulus, with marked dilatation of the sigmoid colon and minimal associated pneumatosis. The sigmoid colon twists about the mesenteric axis. Wall thickening is noted along the distal sigmoid colon and rectum, raising concern for proctitis. Associated presacral stranding is noted. The more proximal colon is grossly unremarkable. The appendix is not definitely characterized; there is no evidence for appendicitis. Visualized small bowel loops are grossly unremarkable, though difficult to fully assess due to dilated colonic loops. The stomach is largely decompressed. There is question of wall thickening along the antrum of the stomach, which could reflect mild gastritis. Would correlate for any associated symptoms. Vascular/Lymphatic: Minimal calcification is seen along the distal abdominal aorta and its branches. The inferior vena cava is grossly unremarkable. No retroperitoneal lymphadenopathy is seen. No pelvic sidewall lymphadenopathy is identified. Reproductive: The bladder is decompressed. Diffuse bladder wall thickening may reflect cystitis. The patient is status post prostatectomy. Other: No additional soft tissue abnormalities are seen. Musculoskeletal: No acute osseous abnormalities are identified. Multilevel vacuum phenomenon is noted along the lumbar spine, with endplate sclerosis. The visualized musculature is  unremarkable in appearance. IMPRESSION: 1. Sigmoid volvulus, with marked dilatation of the sigmoid colon and minimal associated pneumatosis. The sigmoid colon twists about the mesenteric axis. This is new from the prior study and appears to cause some degree of obstruction. 2. Wall thickening along the distal sigmoid colon and rectum, concerning for proctitis, similar in appearance to the prior study. Further evaluation could be considered to exclude underlying mass. Associated presacral stranding noted. 3. Diffuse bladder wall thickening may reflect cystitis. 4. Question of wall thickening along the antrum of the stomach, which could reflect mild gastritis. Would correlate for any associated symptoms. 5. Trace right-sided pleural fluid. Small bibasilar airspace opacities likely reflect atelectasis. 6. Scattered small hypodensities within the liver, measuring up to 6 mm in size. 7. Mild bilateral renal scarring, more prominent on the left. Right renal cyst noted. 8. Degenerative change along the lumbar spine. These results were called by telephone at the time of interpretation on 08/02/2016 at 12:02 am to [facility] PA, who verbally acknowledged these results. Electronically Signed   By: Garald Balding M.D.   On: 08/02/2016 00:05   Dg Chest Port 1 View  Result Date: 08/01/2016 CLINICAL DATA:  Confusion.  Abdominal distention. EXAM: PORTABLE CHEST 1 VIEW COMPARISON:  07/10/2016 FINDINGS: Shallow inspiration. No confluent airspace consolidation. No large effusion. Normal pulmonary vasculature. Visible upper abdomen demonstrates stool and air distention of the colon. IMPRESSION: No acute cardiopulmonary findings. Moderate colonic distention with stool and air. Electronically Signed   By: Andreas Newport M.D.   On: 08/01/2016 22:36   Dg Abd Portable 1 View  Result Date: 08/02/2016 CLINICAL DATA:  Nasogastric tube placement EXAM: PORTABLE ABDOMEN - 1 VIEW COMPARISON:  08/01/2016 FINDINGS: The  nasogastric tube extends into the stomach. There is continued distension of the colon. IMPRESSION: NG tube extends into the stomach. Electronically Signed   By: Andreas Newport M.D.   On: 08/02/2016 02:01    Impression: Distal colonic distention which is most likely the consequence of sigmoid volvulus, but could be secondary to redundancy related to chronic dysmotility exacerbated by ileus, possibly related to hypokalemia.  Plan: 1. Sigmoidoscopic evaluation for the purpose of decompression and to confirm the absence of any obstructing lesions. Petra Kuba, purpose, and risks reviewed with the patient's daughter who is at the bedside, and is agreeable to proceed 2. Potassium repletion. Discussed with house staff and medicine  teaching service team. Specifically, I don't think the patient needs NG suction because it will be of limited effectiveness in decompressing the distal portion of the colon. However, the NG tube could be left in place for administration of oral potassium solution.    LOS: 0 days   Charlyn Vialpando V  08/02/2016, 12:03 PM   Pager [phone] If no answer or after 5 PM call [phone]

## 2016-08-02 NOTE — ED Notes (Signed)
Pa and MD made aware of pt's recent ectopy.  Non-sustained v-tach and multiple PVCs noted on monitor.  EKG completed, per request.  NG tube placement next.

## 2016-08-02 NOTE — Op Note (Signed)
[facility] Patient Name: [name] [name] Procedure Date : 08/02/2016 MRN: [id number] Attending MD: Ronald Lobo , MD Date of Birth: Oct 29, 1930 CSN: [id number] Age: 82 Admit Type: Inpatient Procedure:                Flexible Sigmoidoscopy Indications:              Abnormal CT of the GI tract, For therapy of                            volvulus, Change in bowel habits Providers:                Ronald Lobo, MD, Kingsley Plan, RN, Despina Pole Tech, Technician Referring MD:              Medicines:                None Complications:            No immediate complications. Estimated Blood Loss:     Estimated blood loss: none. Procedure:                Pre-Anesthesia Assessment:                           - Prior to the procedure, a History and Physical                            was performed, and patient medications and                            allergies were reviewed. The patient's tolerance of                            previous anesthesia was also reviewed. The risks                            and benefits of the procedure and the sedation                            options and risks were discussed with the patient.                            All questions were answered, and informed consent                            was obtained. Prior Anticoagulants: The patient has                            taken Coumadin (warfarin), last dose was 1 day                            prior to procedure. ASA Grade Assessment: [name] - A  patient with severe systemic disease. After                            reviewing the risks and benefits, the patient was                            deemed in satisfactory condition to undergo the                            procedure.                           After obtaining informed consent, the scope was                            passed under direct vision. The [id number]               scope was introduced through the anus and advanced                            to the 40 cm from the anal verge. The flexible                            sigmoidoscopy was accomplished with ease. The                            patient tolerated the procedure well. The quality                            of the bowel preparation was excellent, even though                            no prep had been administered. Scope In: Scope Out: Findings:      The digital rectal exam findings include surgically absent prostate.      A suspected point of volvulus, with viable appearing but edematous and       slightly eroded, erythematous mucosa, was found in the distal sigmoid       colon. Decompression of the volvulus was achieved by sliding scope       through this area, which was accomplished without significant       resistance, into a very large, dilated segment of sigmoid colon. Suction       was applied until that portion of the colon was decompressed, and the       scope was advanced slightly until it was not easy to advance any       further, where upon pullback was performed. The mucosa in the area of       the volvulus was without evidence of frank necrosis although some minor       inflammatory changes, as described above, were noted Estimated blood       loss: none. No retroflexion was performed in the rectum. There was no       evidence of diverticular disease, diffuse colitis, or any polyps,       masses, or obstructing lesions up to the limit of the exam. Impression:               -  A surgically absent prostate found on digital                            rectal exam.                           - Suspected volvulus. Partial decompression                            achieved.                           - No specimens collected. Recommendation:           - Perform a flat plate abdominal x-ray today and                            again tomorrow morning to assess adequacy of                             decompression and to check for recurrent distension.                           - Correction of hypokalemia. Procedure Code(s):        --- Professional ---                           [phone], Sigmoidoscopy, flexible; with decompression                            (for pathologic distention) (eg, volvulus,                            megacolon), including placement of decompression                            tube, when performed Diagnosis Code(s):        --- Professional ---                           K56.2, Volvulus                           R93.3, Abnormal findings on diagnostic imaging of                            other parts of digestive tract CPT copyright 2016 [employer]. All rights reserved. The codes documented in this report are preliminary and upon coder review may  be revised to meet current compliance requirements. Ronald Lobo, MD 08/02/2016 1:57:21 PM This report has been signed electronically. Number of Addenda: 0

## 2016-08-02 NOTE — H&P (Signed)
Date: 08/02/2016               Patient Name:  [name] [name] MRN: [id number]  DOB: 02-May-1931 Age / Sex: 82 y.o., male   PCP: [name], DO         Medical Service: Internal Medicine Teaching Service         Attending Physician: Dr. [name], DO    First Contact: Dr. Alphonzo Grieve, MD Pager: [phone]  Second Contact: Dr. Burgess Estelle, MD Pager: [phone]       After Hours (After 5p/  First Contact Pager: [phone]  weekends / holidays): Second Contact Pager: [phone]   Chief Complaint: AMS, abdominal pain.  History of Present Illness:  [name] [name] is an 82 y/o with Mhx significant for Parkinson's disease, CAD, Pulmonary Embolism (lifelong a/c with Coumadin) and prostate cancer s/p prostatectomy who presents for evaluation of altered mental status. Pt has significant dementia so history was obtained from pts daughter, EMR and ED providers. Per pts daughter, he has been complaining of lower abdominal pain and back pain since 11/11. Daughter also notes patient is more "delirious" tonight. Last known BM 5-7 days ago.   In the ED VSS (temp 97.9, BP 144/90, pulse 80, respirations 20 and was saturating 100% on RA). CMP significant for hypokalemia (2.7). CBC shows leukopenia (3.5) and chronic anemia (11.6). Lactic acid and troponin normal. UA significant for infection (many bacteria, large leukocytes, positive nitrites, TNTC WBCs). CT abdomen/pelvis significant for sigmoid volvulus with minimal pneumatosis, maximum colonic dilation <13 cm. NG tube was placed and GI was consulted by the ED who will see the patient in the morning.  Meds:  Current Meds  Medication Sig  . acetaminophen (TYLENOL) 500 MG tablet Take 500 mg by mouth every 4 (four) hours as needed for mild pain or headache.  [name] amLODipine (NORVASC) 2.5 MG tablet TAKE 1 TABLET (2.5 MG TOTAL) BY MOUTH DAILY. (Patient taking differently: Take 2.5 mg by mouth daily. )  . aspirin 81 MG chewable tablet Chew 81 mg by mouth  daily.  [name] b complex vitamins tablet Take 1 tablet by mouth every morning.   . Ca Phosphate-Cholecalciferol (CALCIUM/VITAMIN D3 GUMMIES) 250-350 MG-UNIT CHEW Chew 1 Dose by mouth 2 (two) times daily.  . carbidopa-levodopa (SINEMET IR) 25-100 MG tablet Take 1 tablet by mouth 4 (four) times daily.  . fish oil-omega-3 fatty acids 1000 MG capsule Take 2 g by mouth every morning.   . memantine (NAMENDA XR) 28 MG CP24 24 hr capsule Take 28 mg by mouth every morning.  . Multiple Vitamins-Minerals (MULTIVITAMIN WITH MINERALS) tablet Take 1 tablet by mouth every morning.   . polyethylene glycol (MIRALAX / GLYCOLAX) packet Take 17 g by mouth daily as needed. (Patient taking differently: Take 17 g by mouth daily as needed for mild constipation. )  . QUEtiapine (SEROQUEL) 25 MG tablet Take 1 tablet (25 mg total) by mouth at bedtime.  . senna (SENOKOT) 8.6 MG TABS tablet Take 1 tablet (8.6 mg total) by mouth daily as needed for mild constipation.  . vitamin C (ASCORBIC ACID) 500 MG tablet Take 500 mg by mouth every morning.   . warfarin (COUMADIN) 5 MG tablet TAKE AS DIRECTED. (Patient taking differently: Take 2.5mg  on Monday, Wednesday and Saturday. All other days take 5mg .)   Allergies: Allergies as of 08/01/2016 - Review Complete 08/01/2016  Allergen Reaction Noted  . Aricept [donepezil hcl] Other (See Comments) 07/24/2013  . Other Other (See  Comments) 08/17/2011  . Shellfish-derived products Other (See Comments) 01/21/2016   Past Medical History:  Diagnosis Date  . Anxiety   . Coronary artery disease, non-occlusive    with history of MI; Cath 2008 w multivessel nonobstructive CAD  . Dementia   . Depression   . GERD (gastroesophageal reflux disease)   . Hyperlipidemia   . Hypertension   . PE (pulmonary embolism)    unprovoked PE completed 6 months of warfarin, warfarin d/ced 02/12/2010, repeat PE 07/10/2010 after a long car ride and now on lifelong coumadin.  . Pituitary microadenoma ([place])     incidental finding CT 12/2009  . Prostate cancer ([place])   . Scoliosis    Family History:  Mother: dementia Father: HTN, ICH  Social History: Never smoked. No alcohol or recreational drug use per EMR.   Review of Systems: A complete ROS limited by patients medical status.   Physical Exam: Blood pressure 165/86, pulse 91, temperature 97.9 F (36.6 C), temperature source Rectal, resp. rate 18, SpO2 100 %. General: Chronically-ill appearing african [place] male resting upright in bed. Asleep. In no acute distress. NG tube in place HENT: Pt would not open eyes nor follow commands to evaluate.  Cardiovascular: Regular rate and rhythm. No murmur or rub appreciated Pulmonary: Limited due to patients mental status, otherwise CTA Abdomen: Distended, tense. Did not grimace with palpation. No bowel sounds appreciated.  Extremities: No peripheral edema. 2+ distal pulses  EKG: NSR, rate 78. No ST segment changes or TWI.  CXR: No acute cardiopulmonary findings.   CT Head w/o Contrast: No acute intracranial findings. Remote right thalamic lacunar infarction. Generalized atrophy.   CT abdomen/pelvis: Sigmoid volvulus with marked dilation of sigmoid colon. Minimal associated pneumatosis. Wall thickening along distal sigmoid colon and rectum, concerning for proctitis. Similar to prior study. Diffuse bladder wall thickening concerning for cystitis.   CBC Latest Ref Rng & Units 08/01/2016 08/01/2016 07/15/2016  WBC 4.0 - 10.5 K/uL - 3.5(L) 6.3  Hemoglobin 13.0 - 17.0 g/dL 11.6(L) 10.8(L) -  Hematocrit 39.0 - 52.0 % 34.0(L) 33.7(L) 33.7(L)  Platelets 150 - 400 K/uL - 227 291   CMP Latest Ref Rng & Units 08/01/2016 08/01/2016 07/15/2016  Glucose 65 - 99 mg/dL 123(H) 127(H) 101(H)  BUN 6 - 20 mg/dL 14 12 16   Creatinine 0.61 - 1.24 mg/dL 1.10 1.13 0.98  Sodium 135 - 145 mmol/L 143 140 147(H)  Potassium 3.5 - 5.1 mmol/L 2.7(LL) 2.7(LL) 3.9  Chloride 101 - 111 mmol/L 105 106 111(H)  CO2 22 - 32  mmol/L - 23 20  Calcium 8.9 - 10.3 mg/dL - 9.1 8.6  Total Protein 6.5 - 8.1 g/dL - 6.6 -  Total Bilirubin 0.3 - 1.2 mg/dL - 0.6 -  Alkaline Phos 38 - 126 U/L - 52 -  AST 15 - 41 U/L - 30 -  ALT 17 - 63 U/L - 10(L) -   Assessment & Plan by Problem: Active Problems:   ADENOCARCINOMA, PROSTATE, HX OF   Pulmonary embolism (HCC)   Alzheimer's dementia   CAD (coronary artery disease)   Leukopenia   Chronic systolic heart failure (HCC)   Parkinsonian syndrome (HCC)   Sigmoid volvulus (HCC)   Hypokalemia  Sigmoid Volvulus Pt here with 1 day history of abdominal pain with reported last BM 5-7 days ago. CT abdomen/pelvis obtained in ED significant for sigmoid volvulus. NG tube was placed and GI was consulted who will see the patient in the morning.  -GI already consulted, will see  patient in AM -NPO -NG tube in place -IV ns @ 50 ml/hr -Repeat lactic acid 1.07 -Repeat CBC, CMP and EKG in AM  Urinary Tract Infection UA significant for infection, uncertain if patient symptomatic secondary to mental status. Was recently admitted 10/19-10/24 for pyelonephritis secondary to pansensitive E.coli and was treated with a 10 day course of Ceftriaxone/Keflex. Pts daughter reports he was continuing his abx at his hospital f/u 10/27.  -Urine culture & sensitivities -Blood cultures x2 -IV Ceftriaxone   Hypokalemia Initial potassium 2.7. Pt received 80 mEq via IV with ns.  -Repeat CMET in am, correct as indicated  Pulmonary Embolism Currently on anticoagulation with warfarin. Unprovoked PE in 2010 and was treated with 6 mo of warfarin. Another PE in 2011 after long car ride, now on life-long coumadin. CTA from 2012 without PE and there have been no more CTA's since that time. INR supratheraputic at 3.8. Holding coumadin and will continue to hold in anticipation of GI intervention -Hold coumadin, INR currently supratheraputic -Pharmacy was consulted. Reverse anticoagulation with IV Vitamin K 2.5  mg -Repeat INR/PT at 8am.   CAD, HTN ASA 81 mg and Amlodipine 2.5 mg held as pt NPO.   Parkinson Disease, Alzheimer Dementia Pt on Namenda 10 mg BID and Sinemet IR 25-100 QUID. Will need to hold these medications as pt is NPO however should be resumed once able.   History of Prostate CA S/p Prostatectomy   Code Status: Full code IVF: NS @ 50 mL/hr Diet: NPO DVT Prophylaxis: Coumadin, need to hold in anticipation of intervention  Dispo: Admit patient to Inpatient with expected length of stay greater than 2 midnights.  SignedEinar Gip, DO 08/02/2016, 1:44 AM  Pager: [phone]

## 2016-08-02 NOTE — Progress Notes (Addendum)
Endo called to notify that patients blood pressure is 161/116 and HR is  32. Notified MD: MD asked for recheck of BP and HR. (168/90 HR 62). Verbal given for amlodipine oral and clamp NG tube.  Betha Loa Dorine Duffey, RN   1:56 amlodipine changed to hydralazine

## 2016-08-02 NOTE — Progress Notes (Signed)
   Subjective:  Patient sedated and unable to participate in interview.   Daughter in room; discussed plan and answered questions about care.  Objective:  Vital signs in last 24 hours: Vitals:   08/02/16 1215 08/02/16 1220 08/02/16 1225 08/02/16 1230  BP: (!) 177/122 (!) 199/128 (!) 150/80 (!) 161/116  Pulse: 68 72 75 (!) 32  Resp: 11 15 15 12   Temp:      TempSrc:      SpO2: 96% 96% 95% 98%  Weight:      Height:       Constitutional: NAD, sedated, lying comfortably in bed Neuro: sedated, unable to fully assess CV: RRR, no murmurs, rubs or gallops appreciated Resp: CTAB, no increased work of breathing Abd: distended, diminished bowel sounds, nontender as far as I could tell  Assessment/Plan:  Active Problems:   ADENOCARCINOMA, PROSTATE, HX OF   Pulmonary embolism (HCC)   Alzheimer's dementia   CAD (coronary artery disease)   Leukopenia   Chronic systolic heart failure (HCC)   Parkinsonian syndrome (HCC)   Sigmoid volvulus (HCC)   Hypokalemia  Sigmoid volvulus: Patient found to have sigmoid volvulus by CT. NG tube was placed for decompression without significant output. Dr. Cristina Gong, GI, evaluated patient and performed diagnostic and therapeutic sigmoidoscopy. This revealed torsion point with some mucosal edema but no appreciable ischemia; decompression seems to have been achieved based on visual and physical exam.  --GI following; appreciate assistance --clamp NG tube  UTI:  UA significant for infection, but uncertain if patient symptomatic due to mental status.  --IV ceftriaxone --F/u urine and blood cultures/sensitivities  Hypokalemia: Patient with K 2.7 on admission s/p 66mEq IV KCl. Repeat draw showed critical K 7.5 - repeat 3.5; likely drawn while potassium still being repleted through IV. Will monitor. EKG unchanged from admission - PVC's, no peaked T waves  History of PE:  Patient on lifelong anticoagulation with warfarin. On admission, INR was  supratherapeutic at 3.8; is s/p vit K 2.5mg . Repeat INR in AM was 3.0.  --pharmacy consulted for warfarin dosing; appreciate their help  CAD, HTN:  On asa 81mg  daily and amlodipine 2.5mg  daily. --hold asa, restart on discharge --IV hydral PRN for hypertension until patient can transition to PO  Parkinsons disease, alzheimer dementia:  On namenda 10mg  BID and Sinemet IR 25-100 QID at home; held as NPO --restart when taking PO  H/o Prostate Cancer: Patient is s/p prostatectomy  Dispo: Anticipated discharge in approximately 2-3 day(s).   Alphonzo Grieve, MD 08/02/2016, 1:11 PM Pager [phone]

## 2016-08-02 NOTE — Progress Notes (Addendum)
CRITICAL VALUE STICKER  CRITICAL VALUE: K+ 7.5    DATE & TIME NOTIFIED:  08/02/16 @ 10:30  MESSENGER (representative from lab):  MD NOTIFIED: Savalina   TIME OF NOTIFICATION: 10:32  RESPONSE:  STAT EKG and repeat BNP  Betha Loa Joshuwa Vecchio, RN

## 2016-08-02 NOTE — Progress Notes (Signed)
Patient's sigmoidoscopy went smoothly, revealing a classic "torsion point" in the distal sigmoid region with some associated mucosal edema but no ischemic changes. Endoscopically and by physical exam, significant decompression was achieved. No evidence of obstructing lesion.  Findings discussed with patient's daughter.  Have ordered a KUB for now and again tomorrow morning to assess efficacy of decompression and to check for residual or recurrent gaseous distention area  Cleotis Nipper, M.D. Pager [phone] If no answer or after 5 PM call [phone]

## 2016-08-03 ENCOUNTER — Inpatient Hospital Stay (HOSPITAL_COMMUNITY): Payer: Commercial Managed Care - HMO

## 2016-08-03 ENCOUNTER — Encounter (HOSPITAL_COMMUNITY): Payer: Self-pay | Admitting: Gastroenterology

## 2016-08-03 DIAGNOSIS — D649 Anemia, unspecified: Secondary | ICD-10-CM

## 2016-08-03 LAB — BASIC METABOLIC PANEL
ANION GAP: 10 (ref 5–15)
BUN: 8 mg/dL (ref 6–20)
CHLORIDE: 113 mmol/L — AB (ref 101–111)
CO2: 21 mmol/L — AB (ref 22–32)
CREATININE: 0.84 mg/dL (ref 0.61–1.24)
Calcium: 8.3 mg/dL — ABNORMAL LOW (ref 8.9–10.3)
GFR calc non Af Amer: >60 mL/min (ref >60)
Glucose, Bld: 72 mg/dL (ref 65–99)
POTASSIUM: 2.9 mmol/L — AB (ref 3.5–5.1)
SODIUM: 144 mmol/L (ref 135–145)

## 2016-08-03 LAB — URINE CULTURE

## 2016-08-03 LAB — CBC
HEMATOCRIT: 24.7 % — AB (ref 39.0–52.0)
HEMOGLOBIN: 8.1 g/dL — AB (ref 13.0–17.0)
MCH: 30.1 pg (ref 26.0–34.0)
MCHC: 32.8 g/dL (ref 30.0–36.0)
MCV: 91.8 fL (ref 78.0–100.0)
Platelets: 153 10*3/uL (ref 150–400)
RBC: 2.69 MIL/uL — AB (ref 4.22–5.81)
RDW: 18.6 % — ABNORMAL HIGH (ref 11.5–15.5)
WBC: 3.4 10*3/uL — AB (ref 4.0–10.5)

## 2016-08-03 LAB — PHOSPHORUS: PHOSPHORUS: 2.1 mg/dL — AB (ref 2.5–4.6)

## 2016-08-03 LAB — PROTIME-INR
INR: 1.71
PROTHROMBIN TIME: 20.3 s — AB (ref 11.4–15.2)

## 2016-08-03 LAB — MAGNESIUM: MAGNESIUM: 1.3 mg/dL — AB (ref 1.7–2.4)

## 2016-08-03 MED ORDER — WARFARIN SODIUM 5 MG PO TABS
5.0000 mg | ORAL_TABLET | Freq: Once | ORAL | Status: AC
  Administered 2016-08-03: 5 mg via ORAL
  Filled 2016-08-03: qty 1

## 2016-08-03 MED ORDER — SODIUM CHLORIDE 0.9 % IV SOLN
1000.0000 mL | INTRAVENOUS | Status: DC
  Administered 2016-08-03: 1000 mL via INTRAVENOUS

## 2016-08-03 MED ORDER — K PHOS MONO-SOD PHOS DI & MONO 155-852-130 MG PO TABS
250.0000 mg | ORAL_TABLET | Freq: Once | ORAL | Status: AC
  Administered 2016-08-03: 250 mg via ORAL
  Filled 2016-08-03: qty 1

## 2016-08-03 MED ORDER — WARFARIN - PHARMACIST DOSING INPATIENT
Freq: Every day | Status: DC
  Administered 2016-08-03 – 2016-08-07 (×4)

## 2016-08-03 MED ORDER — CARBIDOPA-LEVODOPA 25-100 MG PO TABS
1.0000 | ORAL_TABLET | Freq: Four times a day (QID) | ORAL | Status: DC
  Administered 2016-08-03 – 2016-08-08 (×17): 1 via ORAL
  Filled 2016-08-03 (×18): qty 1

## 2016-08-03 MED ORDER — MEMANTINE HCL ER 28 MG PO CP24
28.0000 mg | ORAL_CAPSULE | Freq: Every day | ORAL | Status: DC
  Administered 2016-08-03 – 2016-08-08 (×5): 28 mg via ORAL
  Filled 2016-08-03 (×6): qty 1

## 2016-08-03 MED ORDER — QUETIAPINE FUMARATE 25 MG PO TABS
25.0000 mg | ORAL_TABLET | Freq: Every day | ORAL | Status: DC
  Administered 2016-08-03 – 2016-08-07 (×4): 25 mg via ORAL
  Filled 2016-08-03 (×5): qty 1

## 2016-08-03 MED ORDER — POTASSIUM CHLORIDE 20 MEQ/15ML (10%) PO SOLN
40.0000 meq | ORAL | Status: AC
  Administered 2016-08-03 (×2): 40 meq via ORAL
  Filled 2016-08-03 (×2): qty 30

## 2016-08-03 MED ORDER — POTASSIUM CHLORIDE IN NACL 40-0.9 MEQ/L-% IV SOLN
INTRAVENOUS | Status: AC
  Administered 2016-08-03: 75 mL/h via INTRAVENOUS
  Filled 2016-08-03: qty 1000

## 2016-08-03 MED ORDER — MAGNESIUM SULFATE 2 GM/50ML IV SOLN
2.0000 g | Freq: Once | INTRAVENOUS | Status: AC
  Administered 2016-08-03: 2 g via INTRAVENOUS
  Filled 2016-08-03: qty 50

## 2016-08-03 MED ORDER — AMLODIPINE BESYLATE 2.5 MG PO TABS
2.5000 mg | ORAL_TABLET | Freq: Every day | ORAL | Status: DC
  Administered 2016-08-03 – 2016-08-08 (×5): 2.5 mg via ORAL
  Filled 2016-08-03 (×5): qty 1

## 2016-08-03 NOTE — Progress Notes (Signed)
Patient pulled out NG tube. Order for potassium was liquid via NG tube. Per Dr. Cristina Gong patient does not need to be NPO from GI standpoint. Per Internal medicine okay to give oral potassium by mouth. Betha Loa [place], RN

## 2016-08-03 NOTE — Progress Notes (Signed)
Internal Medicine Attending:   I saw and examined the patient. I reviewed the resident's note and I agree with the resident's findings and plan as documented in the resident's note. On exam mr Youse is back to his usual self.  He is much more interactive today and answers simple questions.  He does not appear to be in any distress and does not report any abdominal discomfort.  His abdomen is less distended but still tympanic. He does have some cogwheel rigidity on exam. I have reviewed his abdominal xrays which continue to show colonic dilatation.   He continues to remain hypokalemic.  We will continue oral replacement of this.  He is receiving IVF at 75 cc/hr I would recommend adding 36mEq of KCL to this as well. We have checked a magnesium level which has returned at 1.3 indicating hypomagnesemia.  2 g of IV magnesium has been ordered.  I suspect he will need much more than this.  We will order 4g more of IV magnesium later today and check potassium and magnesium daily. His phosphorus level is borderline.  I would repeat this tomorrow. I suspect his normocytic anemia is dilutional but we will continue to monitor this with a repeat CBC tomorrow. For his UTI we can change him to Keflex 500mg  BID to complete a 7 day course once tolerating PO. We will restart his home medications  We discussed his care with his daughter andrea at bedside.

## 2016-08-03 NOTE — Progress Notes (Signed)
   Subjective:  Patient is awake and alert this morning, but not oriented. No complaints. Per daughter, patient had 2 episodes of loose bowel movements yesterday, and that abdomen looks a lot better and less distended since the sigmoidoscopy.  Objective:  Vital signs in last 24 hours: Vitals:   08/02/16 1516 08/02/16 2128 08/02/16 2136 08/03/16 0553  BP: (!) 151/72 134/76  (!) 143/83  Pulse: 62 76  78  Resp:  18  18  Temp:   97.7 F (36.5 C) 98.1 F (36.7 C)  TempSrc:   Oral Oral  SpO2:  97%  95%  Weight:      Height:       Constitutional: NAD, lying comfortably in bed Neuro: CN 2-12 grossly intact CV: RRR, no murmurs, rubs or gallops appreciated Resp: CTAB, no increased work of breathing Abd: Nondistended, nontender, +BS  Assessment/Plan:  Principal Problem:   Sigmoid volvulus (HCC) Active Problems:   ADENOCARCINOMA, PROSTATE, HX OF   Pulmonary embolism (HCC)   Alzheimer's dementia   CAD (coronary artery disease)   Leukopenia   Chronic systolic heart failure (HCC)   Parkinsonian syndrome (HCC)   Hypokalemia  Sigmoid volvulus: Patient found to have sigmoid volvulus by CT. NG tube was placed for decompression without significant output. Dr. Cristina Gong, GI, evaluated patient and performed diagnostic and therapeutic sigmoidoscopy. This revealed torsion point with some mucosal edema but no appreciable ischemia; decompression seems to have been achieved based on visual and physical exam. Repeat Abd xrays shows still some degree of volvulus but clinically, patient is greatly improved. Patient removed NG tube - will not replace. Will attempt PO and replete K as could be contributing to decreased bowel motility  --GI following; appreciate assistance --d/c NG tube --advance diet  UTI:  UA significant for infection, but uncertain if patient symptomatic due to mental status. Urine culture final result as multiple species. Last UTI 07/09/16 was pan-sensitive E coli. Will treat  accordingly as patient has already been on 2 days of IV antibiotics.  --IV ceftriaxone; switch to PO keflex once patient tolerating PO  Hypokalemia: Patient with K of 2.9 today. Will replete with IV and PO as tolerating. Mag and phos also low. --replete K, Phos, and Mg --f/u AM BMP and Magnesium  History of PE:  Patient on lifelong anticoagulation with warfarin. On admission, INR was supratherapeutic at 3.8; is s/p vit K 2.5mg  - INR 1.7 today. --pharmacy consulted for warfarin dosing; appreciate their help  CAD, HTN:  On asa 81mg  daily and amlodipine 2.5mg  daily; patient hypertensive today. --hold asa, restart on discharge --Restart PO amlodipine  Parkinsons disease, alzheimer dementia:  On namenda XR 28mg  daily and Sinemet IR 25-100 QID at home --restart when taking PO  H/o Prostate Cancer: Patient is s/p prostatectomy  Dispo: Anticipated discharge in approximately 1-2 day(s).   Alphonzo Grieve, MD 08/03/2016, 9:00 AM Pager [phone]

## 2016-08-03 NOTE — Progress Notes (Signed)
Patient seems to be comfortable, and has had a couple of loose bowel movements, from conversation with his daughter.  Interestingly, his post-sigmoidoscopy abdominal films, both yesterday and today, do not show dramatic resolution of his colonic dilatation, just mild improvement. There is still evidence of volvulus type anatomy.  On exam, the patient is lying in bed in no distress whatsoever. There is no overt abdominal distention but there is moderate tympany in the epigastric area. Active, nonobstructive bowel sounds are present.  Labs are pertinent for continued severe hypokalemia, potassium 2.9.  Impression: Based on the patient's clinical and radiographic evolution, I think it is most likely that his volvulus is the consequence, rather than the cause, of his colonic dilatation. I think the primary cause of his clonic dilatation is colonic atony (impaired motility), probably accentuated by hypokalemia.  Recommendation:  1. Continue aggressive potassium supplementation, with a target range of 4.5-5. It may take several days, or perhaps even a week, for this to equilibrate with his body tissues.  2. No objection from my perspective for the patient to begin oral intake. Since he has just pulled out his NG tube, this is all the more appropriate.  3. At the moment, I do not think he needs serial radiographic follow-up, but instead, we can follow the patient clinically.  4. Consider repeat decompression if he becomes visibly distended and/or significantly uncomfortable. Otherwise, with good bowel sounds, presence of bowel movements, and potassium supplementation in process, I think there is a good chance that colonic motility will return and will spontaneously improve the status of his colonic distention.  5. Please feel free to call me at any time to discuss his case in more detail.  Cleotis Nipper, M.D. Pager [phone] If no answer or after 5 PM call [phone]

## 2016-08-03 NOTE — Progress Notes (Signed)
[place] for warfarin Indication: h/o PE  Allergies  Allergen Reactions  . Aricept [Donepezil Hcl] Other (See Comments)    Symptomatic Bradycardia.  . Other Other (See Comments)    Shrimp gives him gout  . Shellfish-Derived Products Other (See Comments)    Causes a flare up with Gout    Patient Measurements: Height: 5\' 9"  (175.3 cm) Weight: 157 lb 9.6 oz (71.5 kg) IBW/kg (Calculated) : 70.7  Vital Signs: Temp: 98.1 F (36.7 C) (11/14 0553) Temp Source: Oral (11/14 0553) BP: 143/83 (11/14 0553) Pulse Rate: 78 (11/14 0553)  Labs:  Recent Labs  08/01/16 2218 08/01/16 2234 08/02/16 0853 08/02/16 1052 08/03/16 0512 08/03/16 0623  HGB 10.8* 11.6* 9.2*  --   --  8.1*  HCT 33.7* 34.0* 28.8*  --   --  24.7*  PLT 227  --  188  --   --  153  LABPROT 38.6*  --  31.8*  --  20.3*  --   INR 3.83  --  3.01  --  1.71  --   CREATININE 1.13 1.10 0.93 0.91 0.84  --     Estimated Creatinine Clearance: 66.6 mL/min (by C-G formula based on SCr of 0.84 mg/dL).   Medical History: Past Medical History:  Diagnosis Date  . Anxiety   . Coronary artery disease, non-occlusive    with history of MI; Cath 2008 w multivessel nonobstructive CAD  . Dementia   . Depression   . GERD (gastroesophageal reflux disease)   . Hyperlipidemia   . Hypertension   . PE (pulmonary embolism)    unprovoked PE completed 6 months of warfarin, warfarin d/ced 02/12/2010, repeat PE 07/10/2010 after a long car ride and now on lifelong coumadin.  . Pituitary microadenoma ([place])    incidental finding CT 12/2009  . Prostate cancer ([place])   . Scoliosis     Medications:  No current facility-administered medications on file prior to encounter.    Current Outpatient Prescriptions on File Prior to Encounter  Medication Sig Dispense Refill  . acetaminophen (TYLENOL) 500 MG tablet Take 500 mg by mouth every 4 (four) hours as needed for mild pain or headache.    [name] amLODipine  (NORVASC) 2.5 MG tablet TAKE 1 TABLET (2.5 MG TOTAL) BY MOUTH DAILY. (Patient taking differently: Take 2.5 mg by mouth daily. ) 90 tablet 3  . b complex vitamins tablet Take 1 tablet by mouth every morning.     . Ca Phosphate-Cholecalciferol (CALCIUM/VITAMIN D3 GUMMIES) 250-350 MG-UNIT CHEW Chew 1 Dose by mouth 2 (two) times daily. 180 tablet 3  . carbidopa-levodopa (SINEMET IR) 25-100 MG tablet Take 1 tablet by mouth 4 (four) times daily. 120 tablet 2  . fish oil-omega-3 fatty acids 1000 MG capsule Take 2 g by mouth every morning.     . Multiple Vitamins-Minerals (MULTIVITAMIN WITH MINERALS) tablet Take 1 tablet by mouth every morning.     . polyethylene glycol (MIRALAX / GLYCOLAX) packet Take 17 g by mouth daily as needed. (Patient taking differently: Take 17 g by mouth daily as needed for mild constipation. ) 14 each 1  . QUEtiapine (SEROQUEL) 25 MG tablet Take 1 tablet (25 mg total) by mouth at bedtime. 90 tablet 4  . senna (SENOKOT) 8.6 MG TABS tablet Take 1 tablet (8.6 mg total) by mouth daily as needed for mild constipation. 120 each 0  . vitamin C (ASCORBIC ACID) 500 MG tablet Take 500 mg by mouth every morning.     [name]  warfarin (COUMADIN) 5 MG tablet TAKE AS DIRECTED. (Patient taking differently: Take 2.5mg  on Monday, Wednesday and Saturday. All other days take 5mg .) 40 tablet 2  . memantine (NAMENDA) 10 MG tablet Take 1 tablet (10 mg total) by mouth 2 (two) times daily. (Patient not taking: Reported on 08/01/2016) 60 tablet 3     Assessment: 82 y.o. male admitted with abdominal pain, h/o PE and warfarin was on hold.  INR supratherapeutic on admission and Vit K 2.5 mg IV given in anticipation of EGD. Pt had elevated INR at last two anticoag visits, he may need adjustment to weekly dose as INR was still elevated on this admission a month later. Last dose was on 11/12. INR has dropped from 3.01 to 1.71 today, now subtherapeutic. Currently PO intake at 0%.   PTA warfarin dose 2.5 mg on Monday,  Wednesday, and Saturday, 5 mg all other days  Goal of Therapy:  INR 2-3 Monitor platelets by anticoagulation protocol: Yes   Plan:  Give warfarin 5 mg po x 1 Monitor daily INR, CBC, clinical course, s/sx of bleed, PO intake, DDI   Thank you for allowing [name] to participate in this patients care. Jens Som, PharmD Pager: [phone] 08/03/2016,11:31 AM

## 2016-08-03 NOTE — Evaluation (Signed)
Physical Therapy Evaluation Patient Details Name: [name] [name] MRN: [id number] DOB: 1931-06-16 Today's Date: 08/03/2016   History of Present Illness  82 year old male with history of parkinson's syndrome, demenia, CAD, PE, and prostate CA s/p prostatectomy who presented for AMS. Underwent sigmoidoscopy 08/02/16  Clinical Impression  Pt presents with impairments in strength, mobility, transfers and balance and will benefit from skilled PT to address deficits and increase functional independence.    Follow Up Recommendations Home health PT;Supervision/Assistance - 24 hour    Equipment Recommendations  None recommended by PT    Recommendations for Other Services       Precautions / Restrictions Precautions Precautions: Fall Restrictions Weight Bearing Restrictions: No      Mobility  Bed Mobility Overal bed mobility: Needs Assistance Bed Mobility: Supine to Sit;Sit to Supine     Supine to sit: Max assist Sit to supine: Max assist   General bed mobility comments: Pt requires assist for bilat LEs in and out of bed, pt requires total A to scoot to hed of bead  Transfers Overall transfer level: Needs assistance Equipment used: Rolling walker (2 wheeled) Transfers: Sit to/from Stand Sit to Stand: Max assist         General transfer comment: Pt requires max A to stand from bed multiple attempts with facilitation for forward wt shift as pt tends to lose balance posteriorly. pt improved with repetition and cuing for anterior wt shifts.  Ambulation/Gait                Stairs            Wheelchair Mobility    Modified Rankin (Stroke Patients Only)       Balance       Sitting balance - Comments: sitting balance with UE and LE movements requires min A due to posterior lean       Standing balance comment: requires mod A and bilat UE support for standing for hygiene after bowel movement. Pt able to stand x 30 seconds then 1 minute                             Pertinent Vitals/Pain Pain Assessment: No/denies pain    Home Living Family/patient expects to be discharged to:: Private residence Living Arrangements: Children Available Help at Discharge: Family;Available 24 hours/day;Personal care attendant Type of Home: House Home Access: Stairs to enter Entrance Stairs-Rails: Psychiatric nurse of Steps: 4 Home Layout: Able to live on main level with bedroom/bathroom;Two level Home Equipment: Walker - 2 wheels;Bedside commode;Wheelchair - manual;Shower seat      Prior Function Level of Independence: Needs assistance   Gait / Transfers Assistance Needed: Assist with ambulation with RW, Uses w/c at times. Needs assist standing from chairs. Sleeps in recliner  ADL's / Homemaking Assistance Needed: Assist with ADLs from PCA.  Comments: Daughter works but has aides come in so pt is never alone.     Hand Dominance        Extremity/Trunk Assessment               Lower Extremity Assessment: Generalized weakness (rigidity in bilat LEs and trunk (parkinsoniam symptoms))      Cervical / Trunk Assessment: Kyphotic  Communication      Cognition   Behavior During Therapy: Flat affect Overall Cognitive Status: History of cognitive impairments - at baseline (dementia)  General Comments      Exercises     Assessment/Plan    PT Assessment Patient needs continued PT services  PT Problem List Decreased strength;Decreased activity tolerance;Decreased balance;Decreased safety awareness;Decreased mobility          PT Treatment Interventions DME instruction;Therapeutic activities;Gait training;Therapeutic exercise;Patient/family education;Balance training;Functional mobility training;Cognitive remediation;Wheelchair mobility training;Neuromuscular re-education;Manual techniques;Modalities    PT Goals (Current goals can be found in the Care Plan section)  Acute Rehab PT  Goals Patient Stated Goal: to go home PT Goal Formulation: With patient/family Time For Goal Achievement: 08/17/16 Potential to Achieve Goals: Good    Frequency Min 3X/week   Barriers to discharge        Co-evaluation               End of Session   Activity Tolerance: Patient tolerated treatment well Patient left: in bed;with call bell/phone within reach;with family/visitor present;with bed alarm set Nurse Communication: Mobility status;Need for lift equipment         Time: [phone] PT Time Calculation (min) (ACUTE ONLY): 20 min   Charges:   PT Evaluation $PT Eval Moderate Complexity: 1 Procedure PT Treatments $Therapeutic Activity: 8-22 mins   PT G Codes:        [name] [name] 2016-08-21, 8:30 AM

## 2016-08-03 NOTE — Care Management Note (Addendum)
Case Management Note  Patient Details  Name: [name] [name] MRN: [id number] Date of Birth: Dec 06, 1930  Subjective/Objective:        Presented with AMS,c/o abdominal pain, hx of parkinson's syndrome, demenia, CAD, PE, and prostate CA s/p prostatectomy. Resides with daughter, [name]. Daughter states wasn't pleased with home health services arranged with last hospital visit. Currently, pt is provided with PCS  from family/friends.Pt uses walker with ambulation.       Lilli Light (Daughter)    [phone]     PCP: Joni Reining  Action/Plan: Return to home with daughter when medically stable. CM to f/u disposition needs. [facility] referral placed.  Expected Discharge Date:                  Expected Discharge Plan:  [place]  In-House Referral:     Discharge planning Services  CM Consult  Post Acute Care Choice:    Choice offered to:  Adult Children  DME Arranged:    DME Agency:     HH Arranged:    HH Agency:     Status of Service:  In process, will continue to follow  If discussed at Long Length of Stay Meetings, dates discussed:    Additional Comments:  Sharin Mons, RN 08/03/2016, 8:32 PM

## 2016-08-04 ENCOUNTER — Ambulatory Visit: Payer: Commercial Managed Care - HMO | Admitting: Cardiology

## 2016-08-04 DIAGNOSIS — R140 Abdominal distension (gaseous): Secondary | ICD-10-CM

## 2016-08-04 DIAGNOSIS — E870 Hyperosmolality and hypernatremia: Secondary | ICD-10-CM

## 2016-08-04 LAB — BASIC METABOLIC PANEL
Anion gap: 10 (ref 5–15)
Anion gap: 8 (ref 5–15)
BUN: 6 mg/dL (ref 6–20)
BUN: 5 mg/dL — AB (ref 6–20)
CALCIUM: 8.4 mg/dL — AB (ref 8.9–10.3)
CHLORIDE: 113 mmol/L — AB (ref 101–111)
CHLORIDE: 111 mmol/L (ref 101–111)
CO2: 23 mmol/L (ref 22–32)
CO2: 21 mmol/L — AB (ref 22–32)
CREATININE: 0.97 mg/dL (ref 0.61–1.24)
CREATININE: 0.84 mg/dL (ref 0.61–1.24)
Calcium: 8.4 mg/dL — ABNORMAL LOW (ref 8.9–10.3)
GFR calc non Af Amer: >60 mL/min (ref >60)
GLUCOSE: 126 mg/dL — AB (ref 65–99)
Glucose, Bld: 82 mg/dL (ref 65–99)
Potassium: 3.1 mmol/L — ABNORMAL LOW (ref 3.5–5.1)
Potassium: 3.7 mmol/L (ref 3.5–5.1)
SODIUM: 146 mmol/L — AB (ref 135–145)
Sodium: 140 mmol/L (ref 135–145)

## 2016-08-04 LAB — CBC
HCT: 31.8 % — ABNORMAL LOW (ref 39.0–52.0)
HEMOGLOBIN: 10.2 g/dL — AB (ref 13.0–17.0)
MCH: 29.4 pg (ref 26.0–34.0)
MCHC: 32.1 g/dL (ref 30.0–36.0)
MCV: 91.6 fL (ref 78.0–100.0)
PLATELETS: 198 10*3/uL (ref 150–400)
RBC: 3.47 MIL/uL — AB (ref 4.22–5.81)
RDW: 18.3 % — ABNORMAL HIGH (ref 11.5–15.5)
WBC: 4 10*3/uL (ref 4.0–10.5)

## 2016-08-04 LAB — MAGNESIUM: Magnesium: 1.9 mg/dL (ref 1.7–2.4)

## 2016-08-04 LAB — PROTIME-INR
INR: 1.52
PROTHROMBIN TIME: 18.4 s — AB (ref 11.4–15.2)

## 2016-08-04 MED ORDER — ENOXAPARIN SODIUM 40 MG/0.4ML ~~LOC~~ SOLN
40.0000 mg | SUBCUTANEOUS | Status: DC
  Administered 2016-08-04 – 2016-08-05 (×2): 40 mg via SUBCUTANEOUS
  Filled 2016-08-04 (×3): qty 0.4

## 2016-08-04 MED ORDER — CEPHALEXIN 500 MG PO CAPS
500.0000 mg | ORAL_CAPSULE | Freq: Two times a day (BID) | ORAL | Status: DC
  Administered 2016-08-04 – 2016-08-08 (×8): 500 mg via ORAL
  Filled 2016-08-04 (×8): qty 1

## 2016-08-04 MED ORDER — POTASSIUM CHLORIDE 20 MEQ/15ML (10%) PO SOLN
40.0000 meq | ORAL | Status: AC
  Filled 2016-08-04: qty 30

## 2016-08-04 MED ORDER — MAGNESIUM SULFATE 2 GM/50ML IV SOLN
2.0000 g | Freq: Once | INTRAVENOUS | Status: AC
  Administered 2016-08-04: 2 g via INTRAVENOUS
  Filled 2016-08-04: qty 50

## 2016-08-04 MED ORDER — KCL IN DEXTROSE-NACL 40-5-0.45 MEQ/L-%-% IV SOLN
INTRAVENOUS | Status: AC
  Administered 2016-08-04: 11:00:00 via INTRAVENOUS
  Filled 2016-08-04: qty 1000

## 2016-08-04 MED ORDER — KCL IN DEXTROSE-NACL 40-5-0.45 MEQ/L-%-% IV SOLN
INTRAVENOUS | Status: AC
  Administered 2016-08-04: via INTRAVENOUS
  Filled 2016-08-04: qty 1000

## 2016-08-04 MED ORDER — ENSURE ENLIVE PO LIQD
237.0000 mL | Freq: Three times a day (TID) | ORAL | Status: DC
  Administered 2016-08-04 – 2016-08-08 (×9): 237 mL via ORAL
  Filled 2016-08-04: qty 237

## 2016-08-04 MED ORDER — WARFARIN SODIUM 2.5 MG PO TABS
2.5000 mg | ORAL_TABLET | Freq: Once | ORAL | Status: AC
  Administered 2016-08-04: 2.5 mg via ORAL
  Filled 2016-08-04: qty 1

## 2016-08-04 MED ORDER — POTASSIUM CHLORIDE 20 MEQ PO PACK
40.0000 meq | PACK | ORAL | Status: AC
  Administered 2016-08-04 (×2): 40 meq via ORAL
  Filled 2016-08-04 (×2): qty 2

## 2016-08-04 NOTE — Progress Notes (Signed)
Internal Medicine Attending:   I saw and examined the patient. I reviewed the resident's note and I agree with the resident's findings and plan as documented in the resident's note. [name] [name] is more interactive. He is not complaining of any abdominal pain.  His daughter is still at bedside and was updated.  Overall we appear to be making some slow and steady progress with his electrolyte replacement.  We will continue oral supplemental of potassium with some IV potassium through his fluids, we have changed to D5 1/2NS due to some mild hypernatremia.  Goal is to get potassium >4, magnesium >2 and phosphorous >2 to assist with resolution of his colonic distension.  On my exam today he still has some mild distension of his lower abdomen but he has no tenderness to palpation.  We will continue to follow him clinically.

## 2016-08-04 NOTE — Progress Notes (Signed)
   08/04/16 0517  Vitals  Temp 98.3 F (36.8 C)  Temp Source Oral  BP (!) 189/113 (patien has dementia, combative)  MAP (mmHg) 132  BP Location Right Arm  BP Method Automatic  Patient Position (if appropriate) Lying  Pulse Rate (!) 102  Resp 18  Oxygen Therapy  SpO2 100 %  O2 Device Room Air    MD made aware. Told her patient was agitated and combative this morning. Will continue to monitor continue to monitor

## 2016-08-04 NOTE — Progress Notes (Signed)
GASTROENTEROLOGY PROGRESS NOTE  Problem:   Colonic distention with volvulus anatomy (atony and distention with secondary volvulus, versus primary volvulus)  Subjective: No complaint of pain. Tolerating liquid diet.  Objective: Abdomen is once again somewhat distended, very tympanitic, rather firm and bulging. No tenderness to palpation. Good bowel sounds present.  Potassium today has come up to 3.7 (although equilibration with intracellular potassium throughout his body will take somewhat longer).    Assessment: Persistent and probably somewhat worsened colonic distention, but with headway on potassium repletion.  Plan: 1. No change in management needed from my standpoint at the present time 2. Patient's daughter expresses concerns regarding lack of mobilization of patient. Would continue to advocate up in chair, walk with assistance as tolerated, etc. My understanding is that the patient was seen by physical therapy today, although I do not see a note from that. 3. Patient's daughter states that he seemed to develop swallowing difficulty the day prior to admission. He does seem to exhibit some degree of oral apraxia. Would consider speech therapy assessment (I believe he was seen by them on previous admission)  Cleotis Nipper, M.D. 08/04/2016 8:24 PM  Pager [phone] If no answer or after 5 PM call [phone]

## 2016-08-04 NOTE — Progress Notes (Signed)
   Subjective:  Patient is awake and alert this morning, but not oriented. Some chest tenderness that is chronic otherwise no complaints. He had another bowel movement last night per nurse tech. Has been tolerating PO meds and PO intake (though it is low).  Objective:  Vital signs in last 24 hours: Vitals:   08/03/16 1243 08/03/16 1321 08/03/16 2116 08/04/16 0517  BP: (!) 162/77 (!) 166/82 (!) 146/81 (!) 189/113  Pulse: 67 82 72 (!) 102  Resp: 16  18 18   Temp: 98.4 F (36.9 C)  98.6 F (37 C) 98.3 F (36.8 C)  TempSrc: Oral  Oral Oral  SpO2: 92% 98% 97% 100%  Weight:      Height:       Constitutional: NAD, lying comfortably in bed Neuro: CN 2-12 grossly intact CV: RRR, no murmurs, rubs or gallops appreciated Resp: CTAB, no increased work of breathing Abd: Nondistended, nontender, +BS  Assessment/Plan:  Principal Problem:   Sigmoid volvulus (HCC) Active Problems:   ADENOCARCINOMA, PROSTATE, HX OF   Pulmonary embolism (HCC)   Alzheimer's dementia   CAD (coronary artery disease)   Leukopenia   Chronic systolic heart failure (HCC)   Parkinsonian syndrome (HCC)   Hypokalemia  Sigmoid volvulus: Clinically resolved. Will not pursue further imaging unless symptoms returnWill attempt PO and replete K as could be contributing to decreased bowel motility  --advance diet  UTI:  UA significant for infection, but uncertain if patient symptomatic due to mental status. Urine culture final result as multiple species. Last UTI 07/09/16 was pan-sensitive E coli. Will treat accordingly as patient has already been on 2 days of IV antibiotics.  --IV ceftriaxone; switch to PO keflex once patient tolerating PO   Hypokalemia: Patient with K of 3.1 today. Will replete with IV and PO as tolerating.  --replete K 67mEq PO q4hr x2; D5 1/2NS/21mEq at 47ml/hrx 12 hrs; and Mg 2g IV --f/u BMP at 6pm - replete K further as needed; f/u AM BMP  History of PE:  Patient on lifelong anticoagulation  with warfarin. On admission, INR was supratherapeutic at 3.8; is s/p vit K 2.5mg  - INR 1.52. --pharmacy consulted for warfarin dosing; appreciate their help  CAD, HTN:  On asa 81mg  daily and amlodipine 2.5mg  daily; patient hypertensive today. --hold asa, restart on discharge --Restart PO amlodipine  Parkinsons disease, alzheimer dementia:  On namenda XR 28mg  daily and Sinemet IR 25-100 QID at home --restarted  H/o Prostate Cancer: Patient is s/p prostatectomy  Dispo: Anticipated discharge in approximately 1-2 day(s).   Alphonzo Grieve, MD 08/04/2016, 7:54 AM Pager [phone]

## 2016-08-04 NOTE — Progress Notes (Signed)
[place] for warfarin Indication: h/o PE  Allergies  Allergen Reactions  . Aricept [Donepezil Hcl] Other (See Comments)    Symptomatic Bradycardia.  . Other Other (See Comments)    Shrimp gives him gout  . Shellfish-Derived Products Other (See Comments)    Causes a flare up with Gout    Patient Measurements: Height: 5\' 9"  (175.3 cm) Weight: 157 lb 9.6 oz (71.5 kg) IBW/kg (Calculated) : 70.7  Vital Signs: Temp: 98.3 F (36.8 C) (11/15 0517) Temp Source: Oral (11/15 0517) BP: 189/113 (11/15 0517) Pulse Rate: 102 (11/15 0517)  Labs:  Recent Labs  08/02/16 0853 08/02/16 1052 08/03/16 0512 08/03/16 0623 08/04/16 0713  HGB 9.2*  --   --  8.1* 10.2*  HCT 28.8*  --   --  24.7* 31.8*  PLT 188  --   --  153 198  LABPROT 31.8*  --  20.3*  --  18.4*  INR 3.01  --  1.71  --  1.52  CREATININE 0.93 0.91 0.84  --  0.97    Estimated Creatinine Clearance: 57.7 mL/min (by C-G formula based on SCr of 0.97 mg/dL).   Medical History: Past Medical History:  Diagnosis Date  . Anxiety   . Coronary artery disease, non-occlusive    with history of MI; Cath 2008 w multivessel nonobstructive CAD  . Dementia   . Depression   . GERD (gastroesophageal reflux disease)   . Hyperlipidemia   . Hypertension   . PE (pulmonary embolism)    unprovoked PE completed 6 months of warfarin, warfarin d/ced 02/12/2010, repeat PE 07/10/2010 after a long car ride and now on lifelong coumadin.  . Pituitary microadenoma ([place])    incidental finding CT 12/2009  . Prostate cancer ([place])   . Scoliosis     Medications:  No current facility-administered medications on file prior to encounter.    Current Outpatient Prescriptions on File Prior to Encounter  Medication Sig Dispense Refill  . acetaminophen (TYLENOL) 500 MG tablet Take 500 mg by mouth every 4 (four) hours as needed for mild pain or headache.    [name] amLODipine (NORVASC) 2.5 MG tablet TAKE 1 TABLET (2.5 MG  TOTAL) BY MOUTH DAILY. (Patient taking differently: Take 2.5 mg by mouth daily. ) 90 tablet 3  . b complex vitamins tablet Take 1 tablet by mouth every morning.     . Ca Phosphate-Cholecalciferol (CALCIUM/VITAMIN D3 GUMMIES) 250-350 MG-UNIT CHEW Chew 1 Dose by mouth 2 (two) times daily. 180 tablet 3  . carbidopa-levodopa (SINEMET IR) 25-100 MG tablet Take 1 tablet by mouth 4 (four) times daily. 120 tablet 2  . fish oil-omega-3 fatty acids 1000 MG capsule Take 2 g by mouth every morning.     . Multiple Vitamins-Minerals (MULTIVITAMIN WITH MINERALS) tablet Take 1 tablet by mouth every morning.     . polyethylene glycol (MIRALAX / GLYCOLAX) packet Take 17 g by mouth daily as needed. (Patient taking differently: Take 17 g by mouth daily as needed for mild constipation. ) 14 each 1  . QUEtiapine (SEROQUEL) 25 MG tablet Take 1 tablet (25 mg total) by mouth at bedtime. 90 tablet 4  . senna (SENOKOT) 8.6 MG TABS tablet Take 1 tablet (8.6 mg total) by mouth daily as needed for mild constipation. 120 each 0  . vitamin C (ASCORBIC ACID) 500 MG tablet Take 500 mg by mouth every morning.     . warfarin (COUMADIN) 5 MG tablet TAKE AS DIRECTED. (Patient taking differently:  Take 2.5mg  on Monday, Wednesday and Saturday. All other days take 5mg .) 40 tablet 2     Assessment: 82 y.o. male admitted with abdominal pain, h/o PE and warfarin was on hold.  INR supratherapeutic on admission and Vit K 2.5 mg IV given in anticipation of EGD. Pt had elevated INR at last two anticoag visits, he may need adjustment to weekly dose as INR was still elevated on this admission a month later. Last dose was on 11/12. INR remains subtherapeutic today at 1.52. Currently PO intake at 0%.   PTA warfarin dose 2.5 mg on Monday, Wednesday, and Saturday, 5 mg all other days  Goal of Therapy:  INR 2-3 Monitor platelets by anticoagulation protocol: Yes   Plan:  Give warfarin 2.5 mg po x 1 Monitor daily INR, CBC, clinical course, s/sx of  bleed, PO intake, DDI   Thank you for allowing [name] to participate in this patients care. Jens Som, PharmD Pager: [phone] 08/04/2016,11:34 AM

## 2016-08-05 DIAGNOSIS — I2510 Atherosclerotic heart disease of native coronary artery without angina pectoris: Secondary | ICD-10-CM

## 2016-08-05 DIAGNOSIS — Z7901 Long term (current) use of anticoagulants: Secondary | ICD-10-CM

## 2016-08-05 DIAGNOSIS — Z79899 Other long term (current) drug therapy: Secondary | ICD-10-CM

## 2016-08-05 DIAGNOSIS — Z86711 Personal history of pulmonary embolism: Secondary | ICD-10-CM

## 2016-08-05 DIAGNOSIS — G309 Alzheimer's disease, unspecified: Secondary | ICD-10-CM

## 2016-08-05 DIAGNOSIS — B9689 Other specified bacterial agents as the cause of diseases classified elsewhere: Secondary | ICD-10-CM

## 2016-08-05 DIAGNOSIS — E876 Hypokalemia: Secondary | ICD-10-CM

## 2016-08-05 DIAGNOSIS — Z9079 Acquired absence of other genital organ(s): Secondary | ICD-10-CM

## 2016-08-05 DIAGNOSIS — G20 Parkinson's disease: Secondary | ICD-10-CM

## 2016-08-05 DIAGNOSIS — Z8546 Personal history of malignant neoplasm of prostate: Secondary | ICD-10-CM

## 2016-08-05 DIAGNOSIS — F0280 Dementia in other diseases classified elsewhere without behavioral disturbance: Secondary | ICD-10-CM

## 2016-08-05 DIAGNOSIS — Z7982 Long term (current) use of aspirin: Secondary | ICD-10-CM

## 2016-08-05 DIAGNOSIS — N390 Urinary tract infection, site not specified: Secondary | ICD-10-CM

## 2016-08-05 DIAGNOSIS — K562 Volvulus: Principal | ICD-10-CM

## 2016-08-05 DIAGNOSIS — I10 Essential (primary) hypertension: Secondary | ICD-10-CM

## 2016-08-05 DIAGNOSIS — I4510 Unspecified right bundle-branch block: Secondary | ICD-10-CM

## 2016-08-05 LAB — BASIC METABOLIC PANEL
ANION GAP: 8 (ref 5–15)
ANION GAP: 8 (ref 5–15)
BUN: 7 mg/dL (ref 6–20)
BUN: 7 mg/dL (ref 6–20)
CHLORIDE: 113 mmol/L — AB (ref 101–111)
CHLORIDE: 113 mmol/L — AB (ref 101–111)
CO2: 20 mmol/L — AB (ref 22–32)
CO2: 20 mmol/L — AB (ref 22–32)
CREATININE: 0.81 mg/dL (ref 0.61–1.24)
Calcium: 8.5 mg/dL — ABNORMAL LOW (ref 8.9–10.3)
Calcium: 8.8 mg/dL — ABNORMAL LOW (ref 8.9–10.3)
Creatinine, Ser: 0.92 mg/dL (ref 0.61–1.24)
GFR calc non Af Amer: >60 mL/min (ref >60)
GFR calc non Af Amer: >60 mL/min (ref >60)
Glucose, Bld: 109 mg/dL — ABNORMAL HIGH (ref 65–99)
Glucose, Bld: 104 mg/dL — ABNORMAL HIGH (ref 65–99)
Potassium: 3.6 mmol/L (ref 3.5–5.1)
Potassium: 4.4 mmol/L (ref 3.5–5.1)
SODIUM: 141 mmol/L (ref 135–145)
Sodium: 141 mmol/L (ref 135–145)

## 2016-08-05 LAB — CBC
HCT: 33.7 % — ABNORMAL LOW (ref 39.0–52.0)
HEMOGLOBIN: 10.8 g/dL — AB (ref 13.0–17.0)
MCH: 29.3 pg (ref 26.0–34.0)
MCHC: 32 g/dL (ref 30.0–36.0)
MCV: 91.6 fL (ref 78.0–100.0)
Platelets: 154 10*3/uL (ref 150–400)
RBC: 3.68 MIL/uL — AB (ref 4.22–5.81)
RDW: 18.4 % — ABNORMAL HIGH (ref 11.5–15.5)
WBC: 3.3 10*3/uL — ABNORMAL LOW (ref 4.0–10.5)

## 2016-08-05 LAB — PHOSPHORUS: Phosphorus: 2.4 mg/dL — ABNORMAL LOW (ref 2.5–4.6)

## 2016-08-05 LAB — PROTIME-INR
INR: 1.77
Prothrombin Time: 20.8 seconds — ABNORMAL HIGH (ref 11.4–15.2)

## 2016-08-05 LAB — MAGNESIUM: Magnesium: 2 mg/dL (ref 1.7–2.4)

## 2016-08-05 MED ORDER — WARFARIN 0.5 MG HALF TABLET
3.5000 mg | ORAL_TABLET | Freq: Once | ORAL | Status: AC
  Administered 2016-08-05: 3.5 mg via ORAL
  Filled 2016-08-05: qty 1

## 2016-08-05 MED ORDER — K PHOS MONO-SOD PHOS DI & MONO 155-852-130 MG PO TABS
250.0000 mg | ORAL_TABLET | Freq: Once | ORAL | Status: AC
  Administered 2016-08-05: 250 mg via ORAL
  Filled 2016-08-05: qty 1

## 2016-08-05 MED ORDER — POTASSIUM CHLORIDE 20 MEQ PO PACK
40.0000 meq | PACK | ORAL | Status: AC
  Filled 2016-08-05: qty 2

## 2016-08-05 MED ORDER — RESOURCE THICKENUP CLEAR PO POWD
ORAL | Status: DC | PRN
  Filled 2016-08-05: qty 125

## 2016-08-05 MED ORDER — POTASSIUM CHLORIDE 20 MEQ/15ML (10%) PO SOLN
40.0000 meq | ORAL | Status: AC
  Administered 2016-08-05 (×2): 40 meq via ORAL
  Filled 2016-08-05 (×2): qty 30

## 2016-08-05 NOTE — Progress Notes (Signed)
Medicine attending: I personally examined this patient today and reviewed pertinent clinical and laboratory data. I concur with the evaluation and management plan as recorded by resident physician Dr. Alphonzo Grieve. We appreciate ongoing GI input. 82 year old man with advanced Alzheimer's dementia and Parkinson's disease, admitted on November 12 with acute onset of lower abdominal and back pain and change from his baseline mental function. CT scan of the abdomen revealed a sigmoid volvulus. Urinalysis consistent with an infection. Antibiotics started for the UTI. GI consultant evaluated the patient. Flexible sigmoidoscopy was done on November 13. He was able to decompress the volvulus with the scope. No malignant lesions identified. Although the patient's pain has resolved, abdomen remains distended. Bowel sounds present. The patient has had persistent hypokalemia and hypomagnesemia which may be contributing to the patulous sigmoid colon. Aggressive replacement in progress and potassium is low normal today. Magnesium near normal. He is on chronic anticoagulation with a history of pulmonary embolism. Cardiogram on admission with sinus tachycardia. Repeat tracing read as atrial flutter but may not be accurate in view of intraventricular conduction defect.. Right bundle branch block appears chronic. He will resume warfarin anticoagulation today. He will be transitioned to oral antibiotics today for his UTI. Advance diet. Anticipate discharge in the next 24-48 hours.  His one and only daughter is present at the bedside. Status and management plan discussed.

## 2016-08-05 NOTE — Progress Notes (Signed)
   Subjective:  Patient denies pain or discomfort. He is still alert and interactive with no change in mental status.   Objective:  Vital signs in last 24 hours: Vitals:   08/04/16 1421 08/04/16 2111 08/05/16 0505 08/05/16 0835  BP: 108/64 (!) 109/51 127/74 (!) 134/92  Pulse: 77 (!) 107 70 93  Resp: 18 18 15    Temp: 97.6 F (36.4 C) 98.7 F (37.1 C) 98.1 F (36.7 C)   TempSrc:   Oral   SpO2: 96% 96% 98% 98%  Weight:      Height:       Constitutional: NAD, sitting up in chair Neuro: CN 2-12 grossly intact CV: RRR, no murmurs, rubs or gallops appreciated Resp: CTAB, no increased work of breathing Abd: Distended slightly more than yesterday, nontender, +BS  Assessment/Plan:  Principal Problem:   Sigmoid volvulus (HCC) Active Problems:   ADENOCARCINOMA, PROSTATE, HX OF   Pulmonary embolism (HCC)   Alzheimer's dementia   CAD (coronary artery disease)   Leukopenia   Chronic systolic heart failure (HCC)   Parkinsonian syndrome (HCC)   Hypokalemia  Sigmoid volvulus: Clinically resolved. Will not pursue further imaging or repeat sigmoidoscopy unless symptoms return. Per discussion with Dr. Cristina Gong, question of possible secondary volvulus due to colonic atony vs primary volvulus. We will keep repleting potassium and monitoring for presence of pain and worsening physical exam findings.  --advance diet  UTI:  UA significant for infection, but uncertain if patient symptomatic due to mental status. Urine culture final result as multiple species. Last UTI 07/09/16 was pan-sensitive E coli. Will treat accordingly as patient has already been on 2 days of IV antibiotics.  --currently on Keflex (day 4 of antibiotics)   Hypokalemia: Patient with K of 3.1 today. Will replete with IV and PO as tolerating. Mag 2. Phosphorus still low at 2.4 --replete K 65mEq PO q4hr x2; D5 1/2NS/68mEq at 46ml/hrx 12 hrs --f/u BMP at 5pm - replete K further as needed; f/u AM BMP  History of PE:    Patient on lifelong anticoagulation with warfarin. On admission, INR was supratherapeutic at 3.8; is s/p vit K 2.5mg  - INR 1.7. --pharmacy consulted for warfarin dosing; appreciate their help  CAD, HTN:  On asa 81mg  daily and amlodipine 2.5mg  daily; patient hypertensive today. --hold asa, restart on discharge --Restart PO amlodipine  Parkinsons disease, alzheimer dementia:  On namenda XR 28mg  daily and Sinemet IR 25-100 QID at home --restarted  H/o Prostate Cancer: Patient is s/p prostatectomy  Dispo: Anticipated discharge in approximately 1-2 day(s).   Alphonzo Grieve, MD 08/05/2016, 1:12 PM Pager [phone]

## 2016-08-05 NOTE — Progress Notes (Signed)
Patient sitting up in chair, no distress, denies abdominal pain. Abdomen moderately distended, firm, basically unchanged from yesterday with palpable bulge relating to distend his colon and upper abdomen.  Potassium level today 3.6, stable from yesterday. Aggressive potassium supplementation in progress. Patient now on soft diet.  Impression: Colonic atony with volvulus. I tend to think that the atony, exacerbated by hypokalemia, is the primary problem, rather than the volvulus which I think is resulting from colonic distention.  Recommendations: No change in management. Repeat decompression in the event of abdominal pain or markedly worse distention. Continue potassium supplementation. Discussed with ward team and with patient's daughter, [name] [name], who is at the bedside.  Cleotis Nipper, M.D. Pager [phone] If no answer or after 5 PM call [phone]

## 2016-08-05 NOTE — Progress Notes (Signed)
[facility] for warfarin Indication: h/o PE  Allergies  Allergen Reactions  . Aricept [Donepezil Hcl] Other (See Comments)    Symptomatic Bradycardia.  . Other Other (See Comments)    Shrimp gives him gout  . Shellfish-Derived Products Other (See Comments)    Causes a flare up with Gout    Patient Measurements: Height: 5\' 9"  (175.3 cm) Weight: 157 lb 9.6 oz (71.5 kg) IBW/kg (Calculated) : 70.7  Vital Signs: Temp: 98.1 F (36.7 C) (11/16 0505) Temp Source: Oral (11/16 0505) BP: 134/92 (11/16 0835) Pulse Rate: 93 (11/16 0835)  Labs:  Recent Labs  08/03/16 0512  08/03/16 0623 08/04/16 0713 08/04/16 1805 08/05/16 0707  HGB  --   < > 8.1* 10.2*  --  10.8*  HCT  --   --  24.7* 31.8*  --  33.7*  PLT  --   --  153 198  --  154  LABPROT 20.3*  --   --  18.4*  --  20.8*  INR 1.71  --   --  1.52  --  1.77  CREATININE 0.84  --   --  0.97 0.84 0.81  < > = values in this interval not displayed.  Estimated Creatinine Clearance: 69.1 mL/min (by C-G formula based on SCr of 0.81 mg/dL).   Medical History: Past Medical History:  Diagnosis Date  . Anxiety   . Coronary artery disease, non-occlusive    with history of MI; Cath 2008 w multivessel nonobstructive CAD  . Dementia   . Depression   . GERD (gastroesophageal reflux disease)   . Hyperlipidemia   . Hypertension   . PE (pulmonary embolism)    unprovoked PE completed 6 months of warfarin, warfarin d/ced 02/12/2010, repeat PE 07/10/2010 after a long car ride and now on lifelong coumadin.  . Pituitary microadenoma ([place])    incidental finding CT 12/2009  . Prostate cancer ([place])   . Scoliosis     Medications:  No current facility-administered medications on file prior to encounter.    Current Outpatient Prescriptions on File Prior to Encounter  Medication Sig Dispense Refill  . acetaminophen (TYLENOL) 500 MG tablet Take 500 mg by mouth every 4 (four) hours as needed for mild pain or  headache.    [name] amLODipine (NORVASC) 2.5 MG tablet TAKE 1 TABLET (2.5 MG TOTAL) BY MOUTH DAILY. (Patient taking differently: Take 2.5 mg by mouth daily. ) 90 tablet 3  . b complex vitamins tablet Take 1 tablet by mouth every morning.     . Ca Phosphate-Cholecalciferol (CALCIUM/VITAMIN D3 GUMMIES) 250-350 MG-UNIT CHEW Chew 1 Dose by mouth 2 (two) times daily. 180 tablet 3  . carbidopa-levodopa (SINEMET IR) 25-100 MG tablet Take 1 tablet by mouth 4 (four) times daily. 120 tablet 2  . fish oil-omega-3 fatty acids 1000 MG capsule Take 2 g by mouth every morning.     . Multiple Vitamins-Minerals (MULTIVITAMIN WITH MINERALS) tablet Take 1 tablet by mouth every morning.     . polyethylene glycol (MIRALAX / GLYCOLAX) packet Take 17 g by mouth daily as needed. (Patient taking differently: Take 17 g by mouth daily as needed for mild constipation. ) 14 each 1  . QUEtiapine (SEROQUEL) 25 MG tablet Take 1 tablet (25 mg total) by mouth at bedtime. 90 tablet 4  . senna (SENOKOT) 8.6 MG TABS tablet Take 1 tablet (8.6 mg total) by mouth daily as needed for mild constipation. 120 each 0  . vitamin C (ASCORBIC  ACID) 500 MG tablet Take 500 mg by mouth every morning.     . warfarin (COUMADIN) 5 MG tablet TAKE AS DIRECTED. (Patient taking differently: Take 2.5mg  on Monday, Wednesday and Saturday. All other days take 5mg .) 40 tablet 2     Assessment: 82 y.o. male admitted with abdominal pain, h/o PE and warfarin was on hold.  INR supratherapeutic on admission and Vit K 2.5 mg IV given in anticipation of EGD. Pt had elevated INR at last two anticoag visits, he may need adjustment to weekly dose as INR was still elevated on this admission a month later. Last dose PTA was on 11/12.  INR remains subtherapeutic today at 1.77. Started receiving Ensure on 11/15.   PTA warfarin dose 2.5 mg on Monday, Wednesday, and Saturday, 5 mg all other days  Goal of Therapy:  INR 2-3 Monitor platelets by anticoagulation protocol: Yes    Plan:  Give warfarin 3.5 mg po x 1 Monitor daily INR, CBC, clinical course, s/sx of bleed, PO intake, DDI   Thank you for allowing [name] to participate in this patients care. Jens Som, PharmD Pager: [phone] 08/05/2016,9:08 AM

## 2016-08-05 NOTE — Evaluation (Signed)
Clinical/Bedside Swallow Evaluation Patient Details  Name: [name] [name] MRN: [id number] Date of Birth: 07-29-1931  Today's Date: 08/05/2016 Time: SLP Start Time (ACUTE ONLY): 0846 SLP Stop Time (ACUTE ONLY): 0901 SLP Time Calculation (min) (ACUTE ONLY): 15 min  Past Medical History:  Past Medical History:  Diagnosis Date  . Anxiety   . Coronary artery disease, non-occlusive    with history of MI; Cath 2008 w multivessel nonobstructive CAD  . Dementia   . Depression   . GERD (gastroesophageal reflux disease)   . Hyperlipidemia   . Hypertension   . PE (pulmonary embolism)    unprovoked PE completed 6 months of warfarin, warfarin d/ced 02/12/2010, repeat PE 07/10/2010 after a long car ride and now on lifelong coumadin.  . Pituitary microadenoma ([place])    incidental finding CT 12/2009  . Prostate cancer ([place])   . Scoliosis    Past Surgical History:  Past Surgical History:  Procedure Laterality Date  . FLEXIBLE SIGMOIDOSCOPY N/A 08/02/2016   Procedure: FLEXIBLE SIGMOIDOSCOPY;  Surgeon: Ronald Lobo, MD;  Location: [facility] ENDOSCOPY;  Service: Endoscopy;  Laterality: N/A;  . LEFT HEART CATHETERIZATION WITH CORONARY ANGIOGRAM N/A 12/10/2014   Procedure: LEFT HEART CATHETERIZATION WITH CORONARY ANGIOGRAM;  Surgeon: [name], MD;  Location: [facility] CATH LAB;  Service: Cardiovascular;  Laterality: N/A;  . LOOP RECORDER IMPLANT N/A 12/16/2014   Procedure: LOOP RECORDER IMPLANT;  Surgeon: [name], MD;  Location: [facility] CATH LAB;  Service: Cardiovascular;  Laterality: N/A;  . PROSTATECTOMY     HPI:  [name] Robertsonis a 82 year old male with history of parkinson's syndrome, demenia, CAD, PE, and prostate CA s/p prostatectomy who presented for AMS. Found to have sigmoid volvulus. Cleared for PO by GI, but family has observed difficulty swallowing. In prior admission pt underwent MBS, recommended to consume nectar thick liquids and dys 3 solids due to oral dysphagia, delayed swallow, silent  penetration of thin liquids. Pt needs a second swallow, small sips, but struggles with this cognitively.   Assessment / Plan / Recommendation Clinical Impression  Pt demonstrates signs of a mild to moderate dysphagia, perhaps slightly improved from last assessment during last admit. Pt is able to masticate and swallow solids without difficulty, small sips of thin liquids are tolerated well with assist. However, when pt given liberty to drink indpenendently, timing of oral transit and swallow appears more discoordinated with multiple swallows and immediate coughing and throat clearing observed. Suspect ongoing penetration of thin liquids consistent with MBS findings. Pt does not have any acute pulmonary findings and tolerated thin liquids after d/c well. However given acute illness and need for recovery, will modify diet acutely to decrease risk. Recommend dys 3/nectar. SLP will f/u for tolerance, no family available to discuss plan.     Aspiration Risk  Mild aspiration risk    Diet Recommendation Dysphagia 3 (Mech soft);Nectar-thick liquid   Liquid Administration via: Cup;Straw Medication Administration: Crushed with puree Supervision: Full supervision/cueing for compensatory strategies Compensations: Slow rate;Small sips/bites;Multiple dry swallows after each bite/sip Postural Changes: Seated upright at 90 degrees    Other  Recommendations Oral Care Recommendations: Oral care BID Other Recommendations: Order thickener from pharmacy   Follow up Recommendations Skilled Nursing facility;24 hour supervision/assistance      Frequency and Duration min 2x/week  2 weeks       Prognosis Prognosis for Safe Diet Advancement: Good      Swallow Study   General HPI: [name] Robertsonis a 82 year old male with history  of parkinson's syndrome, demenia, CAD, PE, and prostate CA s/p prostatectomy who presented for AMS. Found to have sigmoid volvulus. Cleared for PO by GI, but family has observed  difficulty swallowing. In prior admission pt underwent MBS, recommended to consume nectar thick liquids and dys 3 solids due to oral dysphagia, delayed swallow, silent penetration of thin liquids. Pt needs a second swallow, small sips, but struggles with this cognitively. Type of Study: Bedside Swallow Evaluation Previous Swallow Assessment: BSE see impression Diet Prior to this Study: Regular;Thin liquids Temperature Spikes Noted: No Respiratory Status: Room air History of Recent Intubation: No Behavior/Cognition: Alert;Cooperative;Requires cueing;Confused;Pleasant mood Oral Cavity Assessment: Within Functional Limits Oral Care Completed by SLP: No Oral Cavity - Dentition: Adequate natural dentition Vision: Functional for self-feeding Self-Feeding Abilities: Able to feed self Patient Positioning: Upright in chair Baseline Vocal Quality: Low vocal intensity;Other (comment) Volitional Cough: Cognitively unable to elicit Volitional Swallow: Unable to elicit    Oral/Motor/Sensory Function Overall Oral Motor/Sensory Function: Moderate impairment (discoordinated short brief movement)   Ice Chips     Thin Liquid Thin Liquid: Impaired Presentation: Cup;Straw Pharyngeal  Phase Impairments: Multiple swallows;Cough - Delayed;Suspected delayed Swallow    Nectar Thick Nectar Thick Liquid: Not tested   Honey Thick Honey Thick Liquid: Not tested   Puree Puree: Within functional limits   Solid   GO   Solid: Within functional limits       [facility], MA CCC-SLP [id number]  [name] [name], [name] [name] 08/05/2016,10:36 AM

## 2016-08-05 NOTE — Consult Note (Signed)
[facility] CM Inpatient Consult   08/05/2016  [name] [name] [birth year] [id number]   Referral received and patient was screened for re-admission.  Chart review reveals the patient is a 82 year old male with history of parkinson's syndrome, demenia, CAD, PE, and prostate CA s/p prostatectomy who presented for AMS. Came by to speak with the patient and his daughter.  Staff states the daughter has stepped away and may have gone for lunch.  Patient was asleep up in the recliner.  Will follow up on referral needs.    16:30 08/06/16  Met with the patient's daughter regarding restart of services.  Daughter consent [written] to [facility].  States she needs support as she is taking care of both parents.  She would like follow up with medication management as well.  Our community care managers will follow up for monitoring and assess for home visits.  Natividad Brood, RN BSN [facility] Liaison  [phone] business mobile phone Toll free office [phone]

## 2016-08-05 NOTE — Progress Notes (Signed)
Physical Therapy Treatment Patient Details Name: [name] [name] MRN: [id number] DOB: [birth year] Today's Date: 08/05/2016    History of Present Illness 82 year old male with history of parkinson's syndrome, demenia, CAD, PE, and prostate CA s/p prostatectomy who presented for AMS. Underwent sigmoidoscopy 08/02/16    PT Comments    The pt required assistance with gait and transfers due to decreased strength, endurance, and ROM.  The pt needs further PT to become as functional as possible when he discharges.  Continue with POC and work on ROM and transfers next session.  Follow Up Recommendations  Home health PT;Supervision/Assistance - 24 hour     Equipment Recommendations  None recommended by PT    Recommendations for Other Services       Precautions / Restrictions Precautions Precautions: Fall Restrictions Weight Bearing Restrictions: No    Mobility  Bed Mobility Overal bed mobility: Needs Assistance             General bed mobility comments: pt OOB in chair upon arrival  Transfers Overall transfer level: Needs assistance Equipment used: Rolling walker (2 wheeled) Transfers: Sit to/from Stand Sit to Stand: Max assist;+2 safety/equipment         General transfer comment: Pt requires max A + 2 for safety to stand from chair and facilitation for forward wt shift.  Verbal and tactile cues for anterior wt shifts and hip extension  Pt persisted with forward flexed position.  Ambulation/Gait Ambulation/Gait assistance: Max assist (+2 initially and then +1 with a chair follow.) Ambulation Distance (Feet): 40 Feet Assistive device: Rolling walker (2 wheeled) Gait Pattern/deviations: Step-through pattern;Decreased step length - right;Decreased step length - left;Shuffle;Leaning posteriorly;Trunk flexed   Gait velocity interpretation: Below normal speed for age/gender General Gait Details: Pt required constant support along with verbal/tactile cuing for correct posture  and head control. Pt demonstrated lack of hip extension and forward translation of body weight.   Stairs            Wheelchair Mobility    Modified Rankin (Stroke Patients Only)       Balance     Sitting balance-Leahy Scale: Poor Sitting balance - Comments: sitting balance with UE and LE movements requires min A due to posterior lean   Standing balance support: Bilateral upper extremity supported Standing balance-Leahy Scale: Poor Standing balance comment: requires mod A and bilat UE support for standing                    Cognition Arousal/Alertness: Lethargic Behavior During Therapy: WFL for tasks assessed/performed Overall Cognitive Status: History of cognitive impairments - at baseline                      Exercises Total Joint Exercises Long Arc Quad: AAROM;Both;10 reps;Seated    General Comments        Pertinent Vitals/Pain Faces Pain Scale: No hurt    Home Living                      Prior Function            PT Goals (current goals can now be found in the care plan section) Acute Rehab PT Goals Patient Stated Goal: to go home PT Goal Formulation: With patient/family Time For Goal Achievement: 08/17/16 Potential to Achieve Goals: Good Progress towards PT goals: Progressing toward goals    Frequency    Min 3X/week      PT Plan  Co-evaluation             End of Session Equipment Utilized During Treatment: Gait belt Activity Tolerance: Patient tolerated treatment well Patient left: in chair;with call bell/phone within reach;with chair alarm set;with family/visitor present     Time: [id number] PT Time Calculation (min) (ACUTE ONLY): 26 min  Charges:  $Gait Training: 8-22 mins $Therapeutic Activity: 8-22 mins                    G Codes:      Bary Castilla 13-Aug-2016, 5:24 PM  Rito Ehrlich. [place], [place]

## 2016-08-06 ENCOUNTER — Inpatient Hospital Stay (HOSPITAL_COMMUNITY): Payer: Commercial Managed Care - HMO

## 2016-08-06 ENCOUNTER — Encounter (HOSPITAL_COMMUNITY): Payer: Self-pay | Admitting: *Deleted

## 2016-08-06 ENCOUNTER — Encounter (HOSPITAL_COMMUNITY)
Admission: EM | Disposition: A | Discharge status: Home / Self Care | Payer: Self-pay | Source: Home / Self Care | Attending: Internal Medicine

## 2016-08-06 DIAGNOSIS — R140 Abdominal distension (gaseous): Secondary | ICD-10-CM

## 2016-08-06 HISTORY — PX: FLEXIBLE SIGMOIDOSCOPY: SHX5431

## 2016-08-06 LAB — BASIC METABOLIC PANEL
ANION GAP: 9 (ref 5–15)
BUN: 6 mg/dL (ref 6–20)
CHLORIDE: 109 mmol/L (ref 101–111)
CO2: 22 mmol/L (ref 22–32)
Calcium: 8.8 mg/dL — ABNORMAL LOW (ref 8.9–10.3)
Creatinine, Ser: 0.76 mg/dL (ref 0.61–1.24)
GFR calc non Af Amer: >60 mL/min (ref >60)
Glucose, Bld: 139 mg/dL — ABNORMAL HIGH (ref 65–99)
POTASSIUM: 3.6 mmol/L (ref 3.5–5.1)
SODIUM: 140 mmol/L (ref 135–145)

## 2016-08-06 LAB — PROTIME-INR
INR: 2.17
Prothrombin Time: 24.5 seconds — ABNORMAL HIGH (ref 11.4–15.2)

## 2016-08-06 LAB — CBC
HEMATOCRIT: 35.6 % — AB (ref 39.0–52.0)
HEMOGLOBIN: 11.5 g/dL — AB (ref 13.0–17.0)
MCH: 29.4 pg (ref 26.0–34.0)
MCHC: 32.3 g/dL (ref 30.0–36.0)
MCV: 91 fL (ref 78.0–100.0)
Platelets: 207 10*3/uL (ref 150–400)
RBC: 3.91 MIL/uL — AB (ref 4.22–5.81)
RDW: 18.3 % — AB (ref 11.5–15.5)
WBC: 3.1 10*3/uL — AB (ref 4.0–10.5)

## 2016-08-06 LAB — MAGNESIUM: MAGNESIUM: 1.8 mg/dL (ref 1.7–2.4)

## 2016-08-06 SURGERY — SIGMOIDOSCOPY, FLEXIBLE

## 2016-08-06 MED ORDER — POTASSIUM CHLORIDE 20 MEQ PO PACK
40.0000 meq | PACK | ORAL | Status: DC
  Filled 2016-08-06 (×2): qty 2

## 2016-08-06 MED ORDER — WARFARIN 0.5 MG HALF TABLET
3.5000 mg | ORAL_TABLET | Freq: Once | ORAL | Status: AC
  Administered 2016-08-06: 3.5 mg via ORAL
  Filled 2016-08-06: qty 1

## 2016-08-06 MED ORDER — KCL IN DEXTROSE-NACL 40-5-0.45 MEQ/L-%-% IV SOLN
INTRAVENOUS | Status: AC
  Administered 2016-08-06 – 2016-08-07 (×3): via INTRAVENOUS
  Filled 2016-08-06 (×5): qty 1000

## 2016-08-06 MED ORDER — POTASSIUM CHLORIDE 20 MEQ/15ML (10%) PO SOLN
40.0000 meq | ORAL | Status: DC
  Administered 2016-08-06: 40 meq via ORAL
  Filled 2016-08-06: qty 30

## 2016-08-06 MED ORDER — MAGNESIUM SULFATE 2 GM/50ML IV SOLN
2.0000 g | Freq: Once | INTRAVENOUS | Status: AC
  Administered 2016-08-06: 2 g via INTRAVENOUS
  Filled 2016-08-06: qty 50

## 2016-08-06 MED ORDER — POTASSIUM CHLORIDE 20 MEQ/15ML (10%) PO SOLN
40.0000 meq | ORAL | Status: AC
  Filled 2016-08-06: qty 30

## 2016-08-06 MED ORDER — POTASSIUM CHLORIDE 20 MEQ PO PACK
40.0000 meq | PACK | ORAL | Status: AC
  Administered 2016-08-06: 40 meq via ORAL
  Filled 2016-08-06 (×2): qty 2

## 2016-08-06 NOTE — Consult Note (Signed)
   [facility] Inpatient Consult   08/06/2016  Edric Bierce 04-24-1931 [id number]   Consent form signed for restart of [facility] services.   Natividad Brood, RN BSN [facility] Liaison  [phone] business mobile phone Toll free office [phone]

## 2016-08-06 NOTE — Progress Notes (Signed)
[facility] for warfarin Indication: h/o PE  Allergies  Allergen Reactions  . Aricept [Donepezil Hcl] Other (See Comments)    Symptomatic Bradycardia.  . Other Other (See Comments)    Shrimp gives him gout  . Shellfish-Derived Products Other (See Comments)    Causes a flare up with Gout    Patient Measurements: Height: 5\' 9"  (175.3 cm) Weight: 157 lb 9.6 oz (71.5 kg) IBW/kg (Calculated) : 70.7  Vital Signs: Temp: 98 F (36.7 C) (11/17 0540) BP: 125/93 (11/17 0656) Pulse Rate: 84 (11/17 0656)  Labs:  Recent Labs  08/04/16 0713  08/05/16 0707 08/05/16 1558 08/06/16 0603  HGB 10.2*  --  10.8*  --  11.5*  HCT 31.8*  --  33.7*  --  35.6*  PLT 198  --  154  --  207  LABPROT 18.4*  --  20.8*  --  24.5*  INR 1.52  --  1.77  --  2.17  CREATININE 0.97  < > 0.81 0.92 0.76  < > = values in this interval not displayed.  Estimated Creatinine Clearance: 70 mL/min (by C-G formula based on SCr of 0.76 mg/dL).   Medical History: Past Medical History:  Diagnosis Date  . Anxiety   . Coronary artery disease, non-occlusive    with history of MI; Cath 2008 w multivessel nonobstructive CAD  . Dementia   . Depression   . GERD (gastroesophageal reflux disease)   . Hyperlipidemia   . Hypertension   . PE (pulmonary embolism)    unprovoked PE completed 6 months of warfarin, warfarin d/ced 02/12/2010, repeat PE 07/10/2010 after a long car ride and now on lifelong coumadin.  . Pituitary microadenoma ([place])    incidental finding CT 12/2009  . Prostate cancer ([place])   . Scoliosis     Medications:  No current facility-administered medications on file prior to encounter.    Current Outpatient Prescriptions on File Prior to Encounter  Medication Sig Dispense Refill  . acetaminophen (TYLENOL) 500 MG tablet Take 500 mg by mouth every 4 (four) hours as needed for mild pain or headache.    [name] amLODipine (NORVASC) 2.5 MG tablet TAKE 1 TABLET (2.5 MG TOTAL)  BY MOUTH DAILY. (Patient taking differently: Take 2.5 mg by mouth daily. ) 90 tablet 3  . b complex vitamins tablet Take 1 tablet by mouth every morning.     . Ca Phosphate-Cholecalciferol (CALCIUM/VITAMIN D3 GUMMIES) 250-350 MG-UNIT CHEW Chew 1 Dose by mouth 2 (two) times daily. 180 tablet 3  . carbidopa-levodopa (SINEMET IR) 25-100 MG tablet Take 1 tablet by mouth 4 (four) times daily. 120 tablet 2  . fish oil-omega-3 fatty acids 1000 MG capsule Take 2 g by mouth every morning.     . Multiple Vitamins-Minerals (MULTIVITAMIN WITH MINERALS) tablet Take 1 tablet by mouth every morning.     . polyethylene glycol (MIRALAX / GLYCOLAX) packet Take 17 g by mouth daily as needed. (Patient taking differently: Take 17 g by mouth daily as needed for mild constipation. ) 14 each 1  . QUEtiapine (SEROQUEL) 25 MG tablet Take 1 tablet (25 mg total) by mouth at bedtime. 90 tablet 4  . senna (SENOKOT) 8.6 MG TABS tablet Take 1 tablet (8.6 mg total) by mouth daily as needed for mild constipation. 120 each 0  . vitamin C (ASCORBIC ACID) 500 MG tablet Take 500 mg by mouth every morning.     . warfarin (COUMADIN) 5 MG tablet TAKE AS DIRECTED. (Patient  taking differently: Take 2.5mg  on Monday, Wednesday and Saturday. All other days take 5mg .) 40 tablet 2     Assessment: 82 y.o. male admitted with abdominal pain, h/o PE and warfarin was on hold.  INR supratherapeutic on admission and Vit K 2.5 mg IV given in anticipation of EGD. Pt had elevated INR at last two anticoag visits, he may need adjustment to weekly dose as INR was still elevated on this admission a month later. Last dose PTA was on 11/12.  INR just therapeutic today at 2.17. Started receiving Ensure on 11/15, PO intake low. Pt also receiving Lovenox 40 mg daily until therapeutic on warfarin again.    PTA warfarin dose 2.5 mg on Monday, Wednesday, and Saturday, 5 mg all other days  Goal of Therapy:  INR 2-3 Monitor platelets by anticoagulation protocol:  Yes   Plan:  Repeat warfarin 3.5 mg po x 1 Monitor daily INR, CBC, clinical course, s/sx of bleed, PO intake, DDI INR therapeutic, d/c Lovenox   Thank you for allowing [name] to participate in this patients care. Jens Som, PharmD Pager: [phone] 08/06/2016,10:27 AM

## 2016-08-06 NOTE — Progress Notes (Signed)
   Subjective:  Patient is less interactive and appears in discomfort this morning. Per note review, this has been the case with other providers and staff as well. Patient apparently had a significant amount of abdominal pain last evening and early this morning warranting morphine administration.   Objective:  Vital signs in last 24 hours: Vitals:   08/05/16 1500 08/05/16 2141 08/06/16 0540 08/06/16 0656  BP: 129/88 (!) 147/94 (!) 183/115 (!) 125/93  Pulse: 83 90 94 84  Resp: 17 17 18    Temp: 98.3 F (36.8 C) 98.2 F (36.8 C) 98 F (36.7 C)   TempSrc: Oral Oral    SpO2: 95% 97% 95%   Weight:      Height:       Constitutional: appears uncomfortable, VS reviewed Neuro: CN 2-12 grossly intact CV: RRR, no murmurs, rubs or gallops appreciated Resp: CTAB, no increased work of breathing Abd: Distension increased from prior, mechanical tinkling bowel sounds appreciated, palpation does not appear to elicit pain, but hard to assess in this patient.   Assessment/Plan:  Principal Problem:   Sigmoid volvulus (HCC) Active Problems:   ADENOCARCINOMA, PROSTATE, HX OF   Pulmonary embolism (HCC)   Alzheimer's dementia   CAD (coronary artery disease)   Leukopenia   Chronic systolic heart failure (HCC)   Parkinsonian syndrome (HCC)   Hypokalemia   Urinary tract infection without hematuria   Chronic anticoagulation   Right bundle branch block  Secondary Sigmoid volvulus: Exam positive for increased distension and mechanical "tinkling" bowel sounds concerning. In discussion with Dr. Cristina Gong, we both agreed on attempting sigmoid decompression --sigmoid decompression this afternoon with Dr. Cristina Gong  UTI:  UA significant for infection, but uncertain if patient symptomatic due to mental status at admission. Urine culture final result as multiple species. Last UTI 07/09/16 was pan-sensitive E coli. Will treat accordingly as patient has already been on 2 days of IV antibiotics.  --currently on  Keflex (day 5 of antibiotics); end date Nov 19th   Hypokalemia: Patient with K of 3.6 today. Will replete with PO as tolerating. Mag 1.9.  --replete K 11mEq PO q4hr x2 and Mg IV 2g --f/u AM Bmet   History of PE:  Patient on lifelong anticoagulation with warfarin. On admission, INR was supratherapeutic at 3.8; is s/p vit K 2.5mg  - INR 2.17. --pharmacy consulted for warfarin dosing; appreciate their help  CAD, HTN:  On asa 81mg  daily and amlodipine 2.5mg  daily --hold asa, restart on discharge --Amlodipine 2.5mg  daily  Parkinsons disease, alzheimer dementia:  On namenda XR 28mg  daily and Sinemet IR 25-100 QID at home --restarted  H/o Prostate Cancer: Patient is s/p prostatectomy  Dispo: Anticipated discharge in approximately 1-2 day(s).   Alphonzo Grieve, MD 08/06/2016, 11:45 AM Pager [phone]

## 2016-08-06 NOTE — Care Management Important Message (Signed)
Important Message  Patient Details  Name: [name] [name] MRN: [id number] Date of Birth: 1931-03-24   Medicare Important Message Given:  Yes    Doc Mandala Abena 08/06/2016, 10:06 AM

## 2016-08-06 NOTE — Progress Notes (Signed)
GASTROENTEROLOGY PROGRESS NOTE  Problem:   Colonic distention, atony plus/minus volvulus, in setting of dementia and hypokalemia  Subjective: Patient sitting in bed, noncommunicative, no distress  Objective: Potassium remains level at 3.6. Abdomen is distended and firm. Tinkling bowel sounds present. No tenderness to palpation.  Assessment: Stable, not really improving so far on current therapy  Plan: Have discussed with ward team. Options include continuation of current therapy in-house, continuation of current therapy with close outpatient follow-up, and/or a repeat attempt at decompression which might be more successful at this time since the patient's potassium has been somewhat repleted.  Cleotis Nipper, M.D. 08/06/2016 11:35 AM  Pager [phone] If no answer or after 5 PM call [phone]

## 2016-08-06 NOTE — Progress Notes (Signed)
Medicine attending: I examined this patient today together with resident physician Dr. Alphonzo Grieve and I concur with her evaluation and management plan which we discussed together. The patient developed increasing abdominal pain and distention overnight. On exam today, abdomen markedly distended and tympanitic. Scattered, high-pitched, bowel sounds. Situation discussed with gastroenterology. The patient will have a another decompression sigmoidoscopy later today. Potassium 3.6. Goal to maintain above 4 per GI recommendation. Hemoglobin stable to improved at 11.5 g. Impression: #1. Likely recurrent sigmoid volvulus in a elderly man with advanced dementia #2. Mineral and electrolyte abnormalities with hypomagnesemia and hypokalemia. Replacement in progress.  Difficult to know the prognosis in this man. Only definitive procedure would be a sigmoid colectomy. We will need to discuss whether this is a option that the family would want to pursue.

## 2016-08-06 NOTE — Discharge Instructions (Signed)

## 2016-08-06 NOTE — Progress Notes (Signed)
Speech Language Pathology Treatment: Dysphagia  Patient Details Name: [name] [name] MRN: [id number] DOB: Oct 04, 1930 Today's Date: 08/06/2016 Time: [id number] SLP Time Calculation (min) (ACUTE ONLY): 28 min  Assessment / Plan / Recommendation Clinical Impression  Pt demonstrates altered mental status this am. He was difficult to arouse, found to have orange jelly-like medicine in mouth while sleeping. Once awake with mouth clean pt was was still poorly response, not following commands, staring, minimally responsive to max multimodal cues with automatic tasks such as grasping a cup or taking bites and sips. This is very different from function yesterday in which pt was verbally responsive, following commands, able to self feed soft solids and liquids. RN reports pt was given pain medication overnight, which has likely contributed to function today. SLP assessed pt for safety with PO, found to be at increased risk due to oral holding, delayed swallow, decreased awareness and increased signs of aspiration with nectar thick liquids. Pt is not safe to consume PO until mentation has improved. Would still administer meds crushed in puree with close observation and check to make sure pt is not holding the bolus. Discussed with RN. HOLD PO until pt is verbally responsive, following commands. Do not want to make pt NPO because expect improvement over the day and pt may not be seen again by SLP over the next 24 hours. If pt does not improve, make NPO and page weekend SLP at [phone]. Thanks.    HPI HPI: Creeden Demers a 82 year old male with history of parkinson's syndrome, demenia, CAD, PE, and prostate CA s/p prostatectomy who presented for AMS. Found to have sigmoid volvulus. Cleared for PO by GI, but family has observed difficulty swallowing. In prior admission pt underwent MBS, recommended to consume nectar thick liquids and dys 3 solids due to oral dysphagia, delayed swallow, silent penetration of thin  liquids. Pt needs a second swallow, small sips, but struggles with this cognitively.      SLP Plan  Continue with current plan of care     Recommendations  Diet recommendations: Nectar-thick liquid;Dysphagia 3 (mechanical soft) Liquids provided via: Cup Medication Administration: Crushed with puree Supervision: Staff to assist with self feeding;Full supervision/cueing for compensatory strategies Compensations: Slow rate;Small sips/bites;Minimize environmental distractions;Multiple dry swallows after each bite/sip Postural Changes and/or Swallow Maneuvers: Seated upright 90 degrees;Upright 30-60 min after meal                Plan: Continue with current plan of care       GO                Anthonie Lotito, [name] 08/06/2016, 9:48 AM

## 2016-08-06 NOTE — Progress Notes (Signed)
Addendum to earlier progress note from today:  I feel patient should have decompressive sigmoidoscopy as was also done the other day, with the hope that it will now have more enduring effects than when his potassium was lower.  I have discussed the patient's case with the internal medicine ward team, and they are of the same opinion that the patient would benefit from repeat decompression.  In the meantime, I have spoken with the patient's daughter, who indicates that he had a very difficult night due to abdominal pain, and in fact, they had to give him morphine (which may be contributing to his current distention). This certainly reconfirms my opinion that he should have decompression today, and the patient's daughter [name] has agreed to the procedure; she is familiar with the risks based on our earlier conversation earlier this week at the time of his previous exam.  The procedure will be arranged for later this afternoon.  Cleotis Nipper, M.D. Pager [phone] If no answer or after 5 PM call [phone]

## 2016-08-06 NOTE — Progress Notes (Signed)
Patient is not swallowing medication. MD made aware

## 2016-08-06 NOTE — Progress Notes (Signed)
(  Please see progress note from earlier today).  Please see dictated procedure report. Patient just had his second decompressive sigmoidoscopy, and it was well tolerated.  Have discussed case with house staff.  Recommendations:  1. Aggressive potassium supplementation (in addition to maintenance IV fluids with potassium, I would favor 80-160 mEq of oral potassium daily)  2. Have ordered portable KUB for today and for tomorrow morning  3. At this point, I am starting to think that this is more of an anatomic problem than a simple motility problem, although elements of both are probably present. Specifically, during today's sigmoidoscopy, the patient had a very clear torsion point at about 20 cm, below which was no stool, and above which was 1 L of liquid stool with significant colonic dilatation, so he clearly had a functional obstruction.  4. As a consequence, I think we need to start thinking about surgical management of this patient, and I have broached that idea with his daughter, who understandably is not too enthused about the prospect of surgery, given his dementia and medical problems. However, if she is open to that at least the concept of an operation, I would obtain surgical consultation. If she is not, then I think we should look into comfort measures and palliative care and perhaps hospice, particularly if decompression of his intestine cannot be maintained by rectification of his potassium level.  5. Since the patient apparently had significant, severe abdominal pain last night while his daughter was there to observe it, I think we may have been underestimating the amount of symptomatology he may have been getting from his abdominal distention. Therefore, I think we have to be more aggressive about keeping him decompressed, and I would have a lower threshold going forward for intestinal decompression (specifically, based on physical exam, rather than symptoms).  6.  Dr. Clarene Essex  covering for me this weekend. Please call him if questions. Cleotis Nipper, M.D. Pager [phone] If no answer or after 5 PM call [phone]

## 2016-08-06 NOTE — Op Note (Signed)
[facility] Patient Name: [name] [name] Procedure Date : 08/06/2016 MRN: [id number] Attending MD: Ronald Lobo , MD Date of Birth: May 29, 1931 CSN: [id number] Age: 82 Admit Type: Inpatient Procedure:                Flexible Sigmoidoscopy Indications:              For therapy of volvulus, Generalized abdominal pain Providers:                Ronald Lobo, MD, Cleda Daub, RN, Cherylynn Ridges, Technician Referring MD:              Medicines:                None Complications:            No immediate complications. Estimated Blood Loss:     Estimated blood loss: none. Procedure:                Pre-Anesthesia Assessment:                           - Prior to the procedure, a History and Physical                            was performed, and patient medications and                            allergies were reviewed. The patient's tolerance of                            previous anesthesia was also reviewed. The risks                            and benefits of the procedure and the sedation                            options and risks were discussed with the patient.                            All questions were answered, and informed consent                            was obtained. Prior Anticoagulants: The patient has                            taken no previous anticoagulant or antiplatelet                            agents. ASA Grade Assessment: [name] - A patient with                            severe systemic disease. After reviewing the risks  and benefits, the patient was deemed in                            satisfactory condition to undergo the procedure.                           After obtaining informed consent, the scope was                            passed under direct vision. The Colonoscope was                            introduced through the anus and advanced to the 60                            cm from  the anal verge as measured during pullback                            (approx. 90 cm of insertion). The flexible                            sigmoidoscopy was accomplished without difficulty.                            The patient tolerated the procedure well. The                            quality of the bowel preparation was adequate. Scope In: Scope Out: Findings:      There was a torsion point at about 20-25 cm from the anus, above which a       large amount of liquid stool was found in the sigmoid colon, interfering       with visualization. Aspiration was performed through the scope suction       channel. The amount of fluid collected was 1000 mL. The fluid was       opaque, brown and thick. Decompression of the volvulus was attempted,       with aspiration of a large amount of gas from the dilated colon above       the torsion point, and partial decompression was achieved, as evidenced       by a much flatter, softer abdomen post procedure. Where visualized, the       mucosa looked normal. Some diverticula were noted toward the proximal       extent of the exam. Retroflexion not performed. Impression:               - Probable sigmoid volvulus, with large amount of                            gas and liquid stool in the sigmoid colon. Fluid                            and gas aspiration performed with apparent  successful decompression by physical exam. Recommendation:           - Continue present medications.                           - Resume previous diet. Procedure Code(s):        --- Professional ---                           [id number], Sigmoidoscopy, flexible; with decompression                            (for pathologic distention) (eg, volvulus,                            megacolon), including placement of decompression                            tube, when performed Diagnosis Code(s):        --- Professional ---                           [id number],  Volvulus                           R10.84, Generalized abdominal pain CPT copyright 2016 American Medical Association. All rights reserved. The codes documented in this report are preliminary and upon coder review may  be revised to meet current compliance requirements. Ronald Lobo, MD 08/06/2016 3:26:17 PM This report has been signed electronically. Number of Addenda: 0

## 2016-08-06 NOTE — Care Management Important Message (Signed)
Important Message  Patient Details  Name: [name] [name] MRN: [id number] Date of Birth: 1930-10-23   Medicare Important Message Given:  Yes    Taichi Repka Abena 08/06/2016, 10:07 AM

## 2016-08-07 ENCOUNTER — Inpatient Hospital Stay (HOSPITAL_COMMUNITY): Payer: Commercial Managed Care - HMO

## 2016-08-07 LAB — CUP PACEART REMOTE DEVICE CHECK
Date Time Interrogation Session: 20171020033544
MDC IDC PG IMPLANT DT: 20160328

## 2016-08-07 LAB — CBC
HCT: 31.1 % — ABNORMAL LOW (ref 39.0–52.0)
Hemoglobin: 9.7 g/dL — ABNORMAL LOW (ref 13.0–17.0)
MCH: 28.7 pg (ref 26.0–34.0)
MCHC: 31.2 g/dL (ref 30.0–36.0)
MCV: 92 fL (ref 78.0–100.0)
PLATELETS: 180 10*3/uL (ref 150–400)
RBC: 3.38 MIL/uL — AB (ref 4.22–5.81)
RDW: 18.3 % — ABNORMAL HIGH (ref 11.5–15.5)
WBC: 3.1 10*3/uL — AB (ref 4.0–10.5)

## 2016-08-07 LAB — CULTURE, BLOOD (ROUTINE X 2)
CULTURE: NO GROWTH
Culture: NO GROWTH

## 2016-08-07 LAB — BASIC METABOLIC PANEL
Anion gap: 5 (ref 5–15)
BUN: 7 mg/dL (ref 6–20)
CO2: 24 mmol/L (ref 22–32)
Calcium: 8.1 mg/dL — ABNORMAL LOW (ref 8.9–10.3)
Chloride: 113 mmol/L — ABNORMAL HIGH (ref 101–111)
Creatinine, Ser: 0.99 mg/dL (ref 0.61–1.24)
Glucose, Bld: 89 mg/dL (ref 65–99)
POTASSIUM: 3.4 mmol/L — AB (ref 3.5–5.1)
SODIUM: 142 mmol/L (ref 135–145)

## 2016-08-07 LAB — PROTIME-INR
INR: 2.78
PROTHROMBIN TIME: 29.9 s — AB (ref 11.4–15.2)

## 2016-08-07 MED ORDER — WARFARIN SODIUM 2.5 MG PO TABS
2.5000 mg | ORAL_TABLET | Freq: Once | ORAL | Status: AC
  Administered 2016-08-07: 2.5 mg via ORAL
  Filled 2016-08-07: qty 1

## 2016-08-07 MED ORDER — POTASSIUM CHLORIDE 20 MEQ/15ML (10%) PO SOLN
40.0000 meq | ORAL | Status: AC
  Administered 2016-08-07 (×2): 40 meq via ORAL
  Filled 2016-08-07 (×2): qty 30

## 2016-08-07 MED ORDER — POTASSIUM CHLORIDE 20 MEQ PO PACK
40.0000 meq | PACK | ORAL | Status: AC
  Filled 2016-08-07 (×2): qty 2

## 2016-08-07 MED ORDER — SODIUM CHLORIDE 0.9 % IV SOLN
30.0000 meq | Freq: Once | INTRAVENOUS | Status: AC
  Administered 2016-08-07: 30 meq via INTRAVENOUS
  Filled 2016-08-07: qty 15

## 2016-08-07 NOTE — Progress Notes (Signed)
Carelink summary report received. Battery status OK. Normal device function. No new symptom episodes, tachy episodes, brady, or pause episodes. No new AF episodes. Monthly summary reports and ROV/PRN 

## 2016-08-07 NOTE — Progress Notes (Signed)
[place] for warfarin Indication: h/o PE  Allergies  Allergen Reactions  . Aricept [Donepezil Hcl] Other (See Comments)    Symptomatic Bradycardia.  . Other Other (See Comments)    Shrimp gives him gout  . Shellfish-Derived Products Other (See Comments)    Causes a flare up with Gout    Patient Measurements: Height: 5\' 9"  (175.3 cm) Weight: 157 lb 9.6 oz (71.5 kg) IBW/kg (Calculated) : 70.7  Vital Signs: Temp: 98.9 F (37.2 C) (11/18 0452) Temp Source: Oral (11/18 0452) BP: 141/88 (11/18 0452) Pulse Rate: 81 (11/18 0452)  Labs:  Recent Labs  08/05/16 0707 08/05/16 1558 08/06/16 0603 08/07/16 0526  HGB 10.8*  --  11.5* 9.7*  HCT 33.7*  --  35.6* 31.1*  PLT 154  --  207 180  LABPROT 20.8*  --  24.5* 29.9*  INR 1.77  --  2.17 2.78  CREATININE 0.81 0.92 0.76 0.99    Estimated Creatinine Clearance: 56.5 mL/min (by C-G formula based on SCr of 0.99 mg/dL).   Medical History: Past Medical History:  Diagnosis Date  . Anxiety   . Coronary artery disease, non-occlusive    with history of MI; Cath 2008 w multivessel nonobstructive CAD  . Dementia   . Depression   . GERD (gastroesophageal reflux disease)   . Hyperlipidemia   . Hypertension   . PE (pulmonary embolism)    unprovoked PE completed 6 months of warfarin, warfarin d/ced 02/12/2010, repeat PE 07/10/2010 after a long car ride and now on lifelong coumadin.  . Pituitary microadenoma ([place])    incidental finding CT 12/2009  . Prostate cancer ([place])   . Scoliosis     Medications:  No current facility-administered medications on file prior to encounter.    Current Outpatient Prescriptions on File Prior to Encounter  Medication Sig Dispense Refill  . acetaminophen (TYLENOL) 500 MG tablet Take 500 mg by mouth every 4 (four) hours as needed for mild pain or headache.    [name] amLODipine (NORVASC) 2.5 MG tablet TAKE 1 TABLET (2.5 MG TOTAL) BY MOUTH DAILY. (Patient taking  differently: Take 2.5 mg by mouth daily. ) 90 tablet 3  . b complex vitamins tablet Take 1 tablet by mouth every morning.     . Ca Phosphate-Cholecalciferol (CALCIUM/VITAMIN D3 GUMMIES) 250-350 MG-UNIT CHEW Chew 1 Dose by mouth 2 (two) times daily. 180 tablet 3  . carbidopa-levodopa (SINEMET IR) 25-100 MG tablet Take 1 tablet by mouth 4 (four) times daily. 120 tablet 2  . fish oil-omega-3 fatty acids 1000 MG capsule Take 2 g by mouth every morning.     . Multiple Vitamins-Minerals (MULTIVITAMIN WITH MINERALS) tablet Take 1 tablet by mouth every morning.     . polyethylene glycol (MIRALAX / GLYCOLAX) packet Take 17 g by mouth daily as needed. (Patient taking differently: Take 17 g by mouth daily as needed for mild constipation. ) 14 each 1  . QUEtiapine (SEROQUEL) 25 MG tablet Take 1 tablet (25 mg total) by mouth at bedtime. 90 tablet 4  . senna (SENOKOT) 8.6 MG TABS tablet Take 1 tablet (8.6 mg total) by mouth daily as needed for mild constipation. 120 each 0  . vitamin C (ASCORBIC ACID) 500 MG tablet Take 1,000 mg by mouth every morning.     . warfarin (COUMADIN) 5 MG tablet TAKE AS DIRECTED. (Patient taking differently: Take 2.5mg  on Monday, Wednesday and Saturday. All other days take 5mg .) 40 tablet 2     Assessment:  82 y.o. male admitted with abdominal pain, h/o PE and warfarin was on hold.  INR supratherapeutic on admission and Vit K 2.5 mg IV given in anticipation of EGD. Pt had elevated INR at last two anticoag visits, he may need adjustment to weekly dose as INR was still elevated on this admission a month later. Last dose PTA was on 11/12.  INR therapeutic today at 2.78, which is an increase from yesterday (2.17). Hemoglobin is 9.7 g/dL and platelets 180 k/uL, both decreased from yesterday s/p sigmoidoscopy. No notes of bleeding.  Started receiving Ensure on 11/15, PO intake low. He is also on cephalexin for UTI, which could potentially increase INR.    PTA warfarin dose 2.5 mg on  Monday, Wednesday, and Saturday, 5 mg all other days.  Goal of Therapy:  INR 2-3 Monitor platelets by anticoagulation protocol: Yes   Plan:  Warfarin 2.5 mg po x 1 Monitor daily INR, CBC, clinical course, s/sx of bleed, PO intake, DDI  Thank you for allowing [name] to participate in this patients care.  Demetrius Charity, PharmD Acute Care Pharmacy Resident  Pager: [phone] 08/07/2016

## 2016-08-07 NOTE — Progress Notes (Signed)
[name] [name] 9:14 AM  Subjective: Patient seen and examined and discussed with his son and my partner Dr. B and his hospital computer chart reviewed and he currently does not have any complaints and wants to go home and has not eating breakfast yet today but did well from his procedure  Objective: Vital signs stable afebrile no acute distress abdomen is soft occasional bowel sounds nontender x-rays improved white count okay K still 3.4  Assessment: Recurrent volvulus  Plan: Please work harder to get his potassium above 4 and keep it there and watch her closely as an outpatient and we are happy to see back when necessary and please call us if we could be of any further assistance but if this recurs in the short-term might consider surgical options  [facility] E  Pager [phone] After 5PM or if no answer call [phone]

## 2016-08-07 NOTE — Progress Notes (Signed)
Speech Language Pathology Treatment: Dysphagia  Patient Details Name: [name] [name] MRN: [id number] DOB: 24-Nov-1930 Today's Date: 08/07/2016 Time: [id number] SLP Time Calculation (min) (ACUTE ONLY): 20 min  Assessment / Plan / Recommendation Clinical Impression  Patient was seen to assess toleration of puree solids and trial of thin liquids. Patient exhibited multiple swallows with puree solids, but no residuals in oral cavity post swallows and patient's voice was clear. SLP administered controlled cup sips of thin liquids (water) to patient. He did not exhibit any overt s/s of aspiration, however did demonstrate swallow initiation delays. SLP spoke with patient's son, who was present at bedside and discussed reasoning for thickened liquids at this time.   HPI HPI: [name] [name] a 82 year old male with history of parkinson's syndrome, demenia, CAD, PE, and prostate CA s/p prostatectomy who presented for AMS. Found to have sigmoid volvulus. Cleared for PO by GI, but family has observed difficulty swallowing. In prior admission pt underwent MBS, recommended to consume nectar thick liquids and dys 3 solids due to oral dysphagia, delayed swallow, silent penetration of thin liquids. Pt needs a second swallow, small sips, but struggles with this cognitively.      SLP Plan  Continue with current plan of care     Recommendations  Diet recommendations: Nectar-thick liquid;Dysphagia 3 (mechanical soft) Liquids provided via: Cup Medication Administration: Crushed with puree Supervision: Staff to assist with self feeding;Full supervision/cueing for compensatory strategies Compensations: Slow rate;Small sips/bites;Minimize environmental distractions;Multiple dry swallows after each bite/sip Postural Changes and/or Swallow Maneuvers: Seated upright 90 degrees;Upright 30-60 min after meal                Oral Care Recommendations: Oral care BID Follow up Recommendations: Skilled Nursing  facility;24 hour supervision/assistance Plan: Continue with current plan of care       [place], [place], [place] 08/07/16 2:01 PM

## 2016-08-07 NOTE — Progress Notes (Signed)
   Subjective:  Patient sleeping but appears comfortable today. Son at bedside states that he has been sleeping, but has spoken and interacted with son regularly earlier this morning.   Son was interested in discussing discharge planning and further management. I briefly discussed watchful waiting and GI f/u outpatient if distension were to return as discussed with Dr. Cristina Gong yesterday. I outlined details of surgical procedure and specific risks of it in Mr. Zambo as another option. I encouraged family to speak about what patient would have wished; currently he lives with daughter who looks after him.   Objective:  Vital signs in last 24 hours: Vitals:   08/06/16 1440 08/06/16 1450 08/06/16 2300 08/07/16 0452  BP:  134/63 (!) 133/94 (!) 141/88  Pulse: 81 75 (!) 59 81  Resp: 17 15 18 17   Temp:   98.2 F (36.8 C) 98.9 F (37.2 C)  TempSrc:   Oral Oral  SpO2: 94% 97% 100% 98%  Weight:      Height:       Constitutional: NAD, sleeping, VS reviewed Neuro: unable to assess CV: RRR, no murmurs, rubs or gallops appreciated, no LE edema, warm extremities, pulses intact Resp: CTAB, no increased work of breathing Abd: mild-mod distension, soft, normoactive and sounding bowel sounds; does not appear pain is elicited with palpation.  Assessment/Plan:  Principal Problem:   Sigmoid volvulus (HCC) Active Problems:   ADENOCARCINOMA, PROSTATE, HX OF   Pulmonary embolism (HCC)   Alzheimer's dementia   CAD (coronary artery disease)   Leukopenia   Chronic systolic heart failure (HCC)   Parkinsonian syndrome (HCC)   Hypokalemia   Urinary tract infection without hematuria   Chronic anticoagulation   Right bundle branch block   Abdominal distension   Hypomagnesemia  Sigmoid volvulus: Patient is s/p sigmoid decompression x2. Exam shows some abdominal distension that is improved from yesterday, and normal bowel sounds.  --monitor --GI following, appreciate their input  UTI:  UA  significant for infection, but uncertain if patient symptomatic due to mental status at admission. Urine culture final result as multiple species. Last UTI 07/09/16 was pan-sensitive E coli. Will treat accordingly as patient has already been on 2 days of IV antibiotics.  --currently on Keflex (day 6 of antibiotics); end date Nov 19th   Hypokalemia: Patient with K of 3.4 today.   --getting D5 1/2NS + KCl 89mEq x 24, KCL 16mEq run --f/u AM Bmet   History of PE:  Patient on lifelong anticoagulation with warfarin. On admission, INR was supratherapeutic at 3.8; is s/p vit K 2.5mg  - INR 2.7. --pharmacy consulted for warfarin dosing; appreciate their help  CAD, HTN:  On asa 81mg  daily and amlodipine 2.5mg  daily --hold asa, restart on discharge --Amlodipine 2.5mg  daily  Parkinsons disease, alzheimer dementia:  On namenda XR 28mg  daily and Sinemet IR 25-100 QID at home --restarted  H/o Prostate Cancer: Patient is s/p prostatectomy  Dispo: Anticipated discharge in approximately 1-2 day(s).   Alphonzo Grieve, MD 08/07/2016, 8:33 AM Pager [phone]

## 2016-08-08 LAB — BASIC METABOLIC PANEL
Anion gap: 7 (ref 5–15)
BUN: 9 mg/dL (ref 6–20)
CALCIUM: 8.5 mg/dL — AB (ref 8.9–10.3)
CHLORIDE: 113 mmol/L — AB (ref 101–111)
CO2: 22 mmol/L (ref 22–32)
CREATININE: 0.99 mg/dL (ref 0.61–1.24)
GFR calc Af Amer: >60 mL/min (ref >60)
GFR calc non Af Amer: >60 mL/min (ref >60)
Glucose, Bld: 92 mg/dL (ref 65–99)
Potassium: 3.9 mmol/L (ref 3.5–5.1)
SODIUM: 142 mmol/L (ref 135–145)

## 2016-08-08 LAB — CBC
HCT: 32.5 % — ABNORMAL LOW (ref 39.0–52.0)
Hemoglobin: 10.3 g/dL — ABNORMAL LOW (ref 13.0–17.0)
MCH: 29.2 pg (ref 26.0–34.0)
MCHC: 31.7 g/dL (ref 30.0–36.0)
MCV: 92.1 fL (ref 78.0–100.0)
PLATELETS: 189 10*3/uL (ref 150–400)
RBC: 3.53 MIL/uL — AB (ref 4.22–5.81)
RDW: 18.5 % — ABNORMAL HIGH (ref 11.5–15.5)
WBC: 5.1 10*3/uL (ref 4.0–10.5)

## 2016-08-08 LAB — PROTIME-INR
INR: 2.96
Prothrombin Time: 31.5 seconds — ABNORMAL HIGH (ref 11.4–15.2)

## 2016-08-08 MED ORDER — POTASSIUM CHLORIDE 20 MEQ/15ML (10%) PO SOLN
40.0000 meq | ORAL | Status: AC
  Administered 2016-08-08: 40 meq via ORAL
  Filled 2016-08-08: qty 30

## 2016-08-08 MED ORDER — CEPHALEXIN 500 MG PO CAPS: 500.0000 mg | ORAL_CAPSULE | Freq: Two times a day (BID) | ORAL | 0 refills | Status: DC

## 2016-08-08 MED ORDER — POTASSIUM CHLORIDE 20 MEQ PO PACK
40.0000 meq | PACK | ORAL | Status: AC
  Administered 2016-08-08: 40 meq via ORAL
  Filled 2016-08-08 (×2): qty 2

## 2016-08-08 MED ORDER — POTASSIUM CHLORIDE 20 MEQ PO PACK: 40.0000 meq | PACK | Freq: Two times a day (BID) | ORAL | 2 refills | Status: DC

## 2016-08-08 MED ORDER — WARFARIN SODIUM 5 MG PO TABS: 2.5000 mg | ORAL_TABLET | Freq: Every day | ORAL | 2 refills | Status: DC

## 2016-08-08 MED ORDER — WARFARIN SODIUM 2 MG PO TABS
2.0000 mg | ORAL_TABLET | Freq: Once | ORAL | Status: DC
  Filled 2016-08-08: qty 1

## 2016-08-08 MED ORDER — ENSURE ENLIVE PO LIQD: 237.0000 mL | Freq: Three times a day (TID) | ORAL | 12 refills | Status: DC

## 2016-08-08 NOTE — Progress Notes (Signed)
[facility] for warfarin Indication: h/o PE  Allergies  Allergen Reactions  . Aricept [Donepezil Hcl] Other (See Comments)    Symptomatic Bradycardia.  . Other Other (See Comments)    Shrimp gives him gout  . Shellfish-Derived Products Other (See Comments)    Causes a flare up with Gout    Patient Measurements: Height: 5\' 9"  (175.3 cm) Weight: 157 lb 9.6 oz (71.5 kg) IBW/kg (Calculated) : 70.7  Vital Signs: Temp: 98.2 F (36.8 C) (11/19 0541) Temp Source: Oral (11/19 0541) BP: 131/84 (11/19 0541) Pulse Rate: 76 (11/19 0541)  Labs:  Recent Labs  08/06/16 0603 08/07/16 0526 08/08/16 0610  HGB 11.5* 9.7* 10.3*  HCT 35.6* 31.1* 32.5*  PLT 207 180 189  LABPROT 24.5* 29.9* 31.5*  INR 2.17 2.78 2.96  CREATININE 0.76 0.99 0.99    Estimated Creatinine Clearance: 56.5 mL/min (by C-G formula based on SCr of 0.99 mg/dL).   Medical History: Past Medical History:  Diagnosis Date  . Anxiety   . Coronary artery disease, non-occlusive    with history of MI; Cath 2008 w multivessel nonobstructive CAD  . Dementia   . Depression   . GERD (gastroesophageal reflux disease)   . Hyperlipidemia   . Hypertension   . PE (pulmonary embolism)    unprovoked PE completed 6 months of warfarin, warfarin d/ced 02/12/2010, repeat PE 07/10/2010 after a long car ride and now on lifelong coumadin.  . Pituitary microadenoma ([facility])    incidental finding CT 12/2009  . Prostate cancer ([place])   . Scoliosis     Medications:  No current facility-administered medications on file prior to encounter.    Current Outpatient Prescriptions on File Prior to Encounter  Medication Sig Dispense Refill  . acetaminophen (TYLENOL) 500 MG tablet Take 500 mg by mouth every 4 (four) hours as needed for mild pain or headache.    [name] amLODipine (NORVASC) 2.5 MG tablet TAKE 1 TABLET (2.5 MG TOTAL) BY MOUTH DAILY. (Patient taking differently: Take 2.5 mg by mouth daily. ) 90 tablet 3   . b complex vitamins tablet Take 1 tablet by mouth every morning.     . Ca Phosphate-Cholecalciferol (CALCIUM/VITAMIN D3 GUMMIES) 250-350 MG-UNIT CHEW Chew 1 Dose by mouth 2 (two) times daily. 180 tablet 3  . carbidopa-levodopa (SINEMET IR) 25-100 MG tablet Take 1 tablet by mouth 4 (four) times daily. 120 tablet 2  . fish oil-omega-3 fatty acids 1000 MG capsule Take 2 g by mouth every morning.     . Multiple Vitamins-Minerals (MULTIVITAMIN WITH MINERALS) tablet Take 1 tablet by mouth every morning.     . polyethylene glycol (MIRALAX / GLYCOLAX) packet Take 17 g by mouth daily as needed. (Patient taking differently: Take 17 g by mouth daily as needed for mild constipation. ) 14 each 1  . QUEtiapine (SEROQUEL) 25 MG tablet Take 1 tablet (25 mg total) by mouth at bedtime. 90 tablet 4  . senna (SENOKOT) 8.6 MG TABS tablet Take 1 tablet (8.6 mg total) by mouth daily as needed for mild constipation. 120 each 0  . vitamin C (ASCORBIC ACID) 500 MG tablet Take 1,000 mg by mouth every morning.     . warfarin (COUMADIN) 5 MG tablet TAKE AS DIRECTED. (Patient taking differently: Take 2.5mg  on Monday, Wednesday and Saturday. All other days take 5mg .) 40 tablet 2     Assessment: 82 y.o. male admitted with abdominal pain, h/o PE and warfarin was on hold.  INR supratherapeutic on  admission and Vit K 2.5 mg IV given in anticipation of EGD. Pt had elevated INR at last two anticoag visits, he may need adjustment to weekly dose as INR was still elevated on this admission a month later. Last dose PTA was on 11/12.  INR therapeutic today at 2.96 and has been trending up. Hemoglobin is 10.3 g/dL and platelets 189 k/uL, both low but stable. No notes of bleeding.  Started receiving Ensure on 11/15, PO intake low but improved. He is also on cephalexin for UTI, which could potentially increase INR.    PTA warfarin dose 2.5 mg on Monday, Wednesday, and Saturday, 5 mg all other days.  Given INR trending up, will give  reduced dose of warfarin tonight to avoid holding doses.   Goal of Therapy:  INR 2-3 Monitor platelets by anticoagulation protocol: Yes   Plan:  Warfarin 2 mg po x 1 Monitor daily INR, CBC, clinical course, s/sx of bleed, PO intake, DDI  Thank you for allowing [name] to participate in this patients care.  Demetrius Charity, PharmD Acute Care Pharmacy Resident  Pager: [phone] 08/08/2016

## 2016-08-08 NOTE — Progress Notes (Signed)
Subjective:  Patient alert but not oriented. Per daughter he has been talking about several different things and that he has these episodes normally. He does not appear in distress. He is able to answer that he is not in pain, his appetite is good and he wants to go home. Family is agreement about discharge today with close follow up in the [facility]. We discussed return precautions for recurrent volvulus; family is very intune with patient's signs and symptoms are comfortable in evaluating his symptoms and bringing him to GI or ED if necessary.   Objective:  Vital signs in last 24 hours: Vitals:   08/07/16 0452 08/07/16 1521 08/07/16 2152 08/08/16 0541  BP: (!) 141/88 121/69 127/78 131/84  Pulse: 81 66 79 76  Resp: 17 20 18 18   Temp: 98.9 F (37.2 C) 98.2 F (36.8 C) 98.2 F (36.8 C) 98.2 F (36.8 C)  TempSrc: Oral Oral Oral Oral  SpO2: 98% 100% 98% 98%  Weight:      Height:       Constitutional: NAD, alert but not oriented, VS reviewed Neuro: CN 2-12 grossly intact CV: RRR, no murmurs, rubs or gallops appreciated, no LE edema, warm extremities, pulses intact Resp: CTAB, no increased work of breathing Abd: mild-mod distension, soft, normoactive and sounding bowel sounds; nontender  Assessment/Plan:  Principal Problem:   Sigmoid volvulus (HCC) Active Problems:   ADENOCARCINOMA, PROSTATE, HX OF   Pulmonary embolism (HCC)   Alzheimer's dementia   CAD (coronary artery disease)   Leukopenia   Chronic systolic heart failure (HCC)   Parkinsonian syndrome (HCC)   Hypokalemia   Urinary tract infection without hematuria   Chronic anticoagulation   Right bundle branch block   Abdominal distension   Hypomagnesemia  Sigmoid volvulus: Patient is s/p sigmoid decompression x2. Exam shows some abdominal distension that and normal bowel sounds. Patient has been eating well and apparently had a couple of bowel movements over the last day.  --patient will follow up with [facility] clinic on  Wednesday. Discussed strict return precautions with family. --Discussed with both son and daughter risk of recurrence and possibility of surgery in the future. They elected to focus on the immediate future and see how he does after the second decompression. I urged them to discuss as a family what they think Mr. Provencher goals would be.   UTI:  UA significant for infection, but uncertain if patient symptomatic due to mental status at admission. Urine culture final result as multiple species. Last UTI 07/09/16 was pan-sensitive E coli. Will treat accordingly as patient has already been on 2 days of IV antibiotics.  --currently on Keflex (day 7 of antibiotics); Will prescribe for one more dose this PM once home   Hypokalemia: Patient with K of 3.9 today.   --administering 84mEq PO q4hr x2 today. --Discharge on 47mEq PO BID; will have f/u in [facility] clinic on Wednesday and will recheck BMP at that time for further replacement and dose adjustment  History of PE:  Patient on lifelong anticoagulation with warfarin. On admission, INR was supratherapeutic at 3.8; is s/p vit K 2.5mg  - INR 2.7. --pharmacy consulted for warfarin dosing; appreciate their help --discussed with pharmacy about home dosing as patient was supratherapeutic on admission and INR trending up during hospitalization and poor PO intake. They suggest 2.5mg  daily and close follow up with coumadin clinic in [facility] on Wednesday for further adjustment as necessary.   CAD, HTN:  On asa 81mg  daily and amlodipine 2.5mg  daily --hold  asa, restart on discharge --Amlodipine 2.5mg  daily  Parkinsons disease, alzheimer dementia:  On namenda XR 28mg  daily and Sinemet IR 25-100 QID at home --restarted  H/o Prostate Cancer: Patient is s/p prostatectomy  Dispo: Anticipated discharge today; HH PT.   Alphonzo Grieve, MD 08/08/2016, 11:12 AM Pager [phone]

## 2016-08-09 ENCOUNTER — Encounter (HOSPITAL_COMMUNITY): Payer: Self-pay | Admitting: Gastroenterology

## 2016-08-09 ENCOUNTER — Ambulatory Visit (INDEPENDENT_AMBULATORY_CARE_PROVIDER_SITE_OTHER): Payer: Commercial Managed Care - HMO | Admitting: *Deleted

## 2016-08-09 ENCOUNTER — Telehealth: Payer: Self-pay

## 2016-08-09 ENCOUNTER — Other Ambulatory Visit: Payer: Self-pay

## 2016-08-09 DIAGNOSIS — E876 Hypokalemia: Secondary | ICD-10-CM

## 2016-08-09 DIAGNOSIS — R55 Syncope and collapse: Secondary | ICD-10-CM

## 2016-08-09 DIAGNOSIS — I159 Secondary hypertension, unspecified: Secondary | ICD-10-CM

## 2016-08-09 NOTE — Telephone Encounter (Signed)
Please have someone contact [employer] to provide an acceptable alternative from their formulary and let me know the answer.  I will then re-prescribe the preferred medication.  Thank you.

## 2016-08-09 NOTE — Addendum Note (Signed)
Addended by: Hulan Fray on: 08/09/2016 09:16 PM   Modules accepted: Orders

## 2016-08-09 NOTE — Progress Notes (Signed)
Carelink Summary Report / Loop Recorder 

## 2016-08-09 NOTE — Telephone Encounter (Signed)
Pt daughter needs to speak with a nurse regarding meds.

## 2016-08-09 NOTE — Telephone Encounter (Signed)
Patients daughter called stating potassium packets cost 900.00 please advise

## 2016-08-10 ENCOUNTER — Telehealth: Payer: Self-pay | Admitting: Internal Medicine

## 2016-08-10 DIAGNOSIS — G20 Parkinson's disease: Secondary | ICD-10-CM | POA: Diagnosis not present

## 2016-08-10 DIAGNOSIS — I110 Hypertensive heart disease with heart failure: Secondary | ICD-10-CM | POA: Diagnosis not present

## 2016-08-10 DIAGNOSIS — N12 Tubulo-interstitial nephritis, not specified as acute or chronic: Secondary | ICD-10-CM | POA: Diagnosis not present

## 2016-08-10 DIAGNOSIS — I5022 Chronic systolic (congestive) heart failure: Secondary | ICD-10-CM | POA: Diagnosis not present

## 2016-08-10 DIAGNOSIS — F0280 Dementia in other diseases classified elsewhere without behavioral disturbance: Secondary | ICD-10-CM | POA: Diagnosis not present

## 2016-08-10 MED ORDER — POTASSIUM CHLORIDE CRYS ER 20 MEQ PO TBCR: 40.0000 meq | EXTENDED_RELEASE_TABLET | Freq: Two times a day (BID) | ORAL | 2 refills | Status: DC

## 2016-08-10 MED ORDER — POTASSIUM CHLORIDE 40 MEQ/15ML (20%) PO SOLN: 40.0000 meq | Freq: Two times a day (BID) | ORAL | 2 refills | Status: DC

## 2016-08-10 MED FILL — KLOR-CON M20 TABLET: 20 | 30 days supply | Qty: 120 | Fill #0

## 2016-08-10 NOTE — Telephone Encounter (Signed)
Daughter called back this am, pt has hit the donut hole so that is why the K+ is so much, im sending this to dr Maudie Mercury, dr Heber [place] and attending, I feel certain we can resolve this Pt does have swallowing issues but can take small pills

## 2016-08-10 NOTE — Telephone Encounter (Signed)
We just need to know what a cheaper alternative is and we can send something in.  Possibly an over the counter option?    Dr. Maudie Mercury can you weigh in?

## 2016-08-10 NOTE — Telephone Encounter (Signed)
Spoke with daughter she reports that Medication has potassium RX has been changed by [employer] will pick up at [facility]

## 2016-08-10 NOTE — Telephone Encounter (Signed)
APT. REMINDER CALL, LMTCB °

## 2016-08-11 ENCOUNTER — Ambulatory Visit (INDEPENDENT_AMBULATORY_CARE_PROVIDER_SITE_OTHER): Payer: Commercial Managed Care - HMO | Admitting: Internal Medicine

## 2016-08-11 ENCOUNTER — Ambulatory Visit (INDEPENDENT_AMBULATORY_CARE_PROVIDER_SITE_OTHER): Payer: Commercial Managed Care - HMO | Admitting: Pharmacist

## 2016-08-11 ENCOUNTER — Encounter: Payer: Self-pay | Admitting: Internal Medicine

## 2016-08-11 VITALS — BP 133/85 | HR 74 | Temp 98.4°F | Wt 156.3 lb

## 2016-08-11 DIAGNOSIS — Z7902 Long term (current) use of antithrombotics/antiplatelets: Secondary | ICD-10-CM

## 2016-08-11 DIAGNOSIS — E876 Hypokalemia: Secondary | ICD-10-CM

## 2016-08-11 DIAGNOSIS — Z7901 Long term (current) use of anticoagulants: Secondary | ICD-10-CM

## 2016-08-11 DIAGNOSIS — K562 Volvulus: Secondary | ICD-10-CM

## 2016-08-11 DIAGNOSIS — Z09 Encounter for follow-up examination after completed treatment for conditions other than malignant neoplasm: Secondary | ICD-10-CM | POA: Diagnosis not present

## 2016-08-11 DIAGNOSIS — I2699 Other pulmonary embolism without acute cor pulmonale: Secondary | ICD-10-CM

## 2016-08-11 DIAGNOSIS — Z86711 Personal history of pulmonary embolism: Secondary | ICD-10-CM

## 2016-08-11 DIAGNOSIS — G309 Alzheimer's disease, unspecified: Secondary | ICD-10-CM

## 2016-08-11 DIAGNOSIS — I2782 Chronic pulmonary embolism: Secondary | ICD-10-CM

## 2016-08-11 DIAGNOSIS — N390 Urinary tract infection, site not specified: Secondary | ICD-10-CM

## 2016-08-11 DIAGNOSIS — Z87440 Personal history of urinary (tract) infections: Secondary | ICD-10-CM

## 2016-08-11 DIAGNOSIS — Z8719 Personal history of other diseases of the digestive system: Secondary | ICD-10-CM | POA: Diagnosis not present

## 2016-08-11 DIAGNOSIS — F0280 Dementia in other diseases classified elsewhere without behavioral disturbance: Secondary | ICD-10-CM

## 2016-08-11 LAB — POCT INR: INR: 2

## 2016-08-11 NOTE — Progress Notes (Signed)
Indication: Recurrent venous thromboembolism. Duration: Indefinite. INR: At target. Agree with Dr. Groce's assessment and plan. 

## 2016-08-11 NOTE — Assessment & Plan Note (Signed)
INR supratherapeutic during hospitalization. Last INR 2.96 at time of discharge. He will be seen in coumadin clinic today. Continue coumadin 2.5 mg daily for now.

## 2016-08-11 NOTE — Assessment & Plan Note (Signed)
Completed course of Keflex. No current UTI symptoms.

## 2016-08-11 NOTE — Progress Notes (Signed)
Anti-Coagulation Progress Note  [name] [name] is a 82 y.o. male who is currently on an anti-coagulation regimen.    RECENT RESULTS: Recent results are below, the most recent result is correlated with a dose of 25 mg. per week: Lab Results  Component Value Date   INR 2.0 08/11/2016   INR 2.96 08/08/2016   INR 2.78 08/07/2016    ANTI-COAG DOSE: Anticoagulation Dose Instructions as of 08/11/2016      [name] Tue Wed Thu Fri Sat   New Dose 2.5 mg 5 mg 2.5 mg 5 mg 2.5 mg 5 mg 2.5 mg    Description   Give one (1) tablet by mouth on Mondays, Wednesdays and Fridays; on all other days--give only 1/2 tablet.       ANTICOAG SUMMARY: Anticoagulation Episode Summary    Current INR goal:   2.0-3.0  TTR:   72.1 % (5.6 y)  Next INR check:   08/30/2016  INR from last check:   2.0 (08/11/2016)  Weekly max dose:     Target end date:   Indefinite  INR check location:   Coumadin Clinic  Preferred lab:     Send INR reminders to:   ANTICOAG IMP   Indications   Pulmonary embolism ([place]) [I26.99] Encounter for long-term use of antiplatelets/antithrombotics [Z79.02]       Comments:   History of multiple venous embolic episodes. Will continue to annual re-evaluate continued need for warfarin weighing risks vs. benefits.       Anticoagulation Care Providers    Provider Role Specialty Phone number   Bertha Stakes, MD  Internal Medicine [phone]      ANTICOAG TODAY: Anticoagulation Summary  As of 08/11/2016   INR goal:   2.0-3.0  TTR:     Today's INR:   2.0  Next INR check:   08/30/2016  Target end date:   Indefinite   Indications   Pulmonary embolism ([place]) [I26.99] Encounter for long-term use of antiplatelets/antithrombotics [Z79.02]        Anticoagulation Episode Summary    INR check location:   Coumadin Clinic   Preferred lab:      Send INR reminders to:   ANTICOAG IMP   Comments:   History of multiple venous embolic episodes. Will continue to annual re-evaluate continued  need for warfarin weighing risks vs. benefits.     Anticoagulation Care Providers    Provider Role Specialty Phone number   Bertha Stakes, MD  Internal Medicine [phone]      PATIENT INSTRUCTIONS: Give 1 tablet of warfarin (5mg  strength tablet) on Mondays, Wednesdays and Fridays; on all other days--give ONLY 1/2 tablet.   FOLLOW-UP Return in 3 weeks (on 08/30/2016) for Follow up INR at 1000h.  Jorene Guest, [name] Pharm.D., CACP

## 2016-08-11 NOTE — Telephone Encounter (Signed)
Was able to transfer to [facility] for now due to patient being in the donut hole. Otherwise, should be covered by insurance starting in January.  Thank you

## 2016-08-11 NOTE — Patient Instructions (Signed)
General Instructions: - Please contact Dr. Buccini's office or go to the emergency room if you develop abdominal pain, nausea, vomiting, or worsening swelling - We will check your potassium level today. I will let you know if you need to change your dose - You will be seen in coumadin clinic today  Please bring your medicines with you each time you come to clinic.  Medicines may include prescription medications, over-the-counter medications, herbal remedies, eye drops, vitamins, or other pills.   Progress Toward Treatment Goals:  Treatment Goal 04/11/2013  Blood pressure at goal  Prevent falls improved    Self Care Goals & Plans:  Self Care Goal 02/23/2016  Manage my medications take my medicines as prescribed; bring my medications to every visit; refill my medications on time  Monitor my health keep track of my blood pressure  Eat healthy foods drink diet soda or water instead of juice or soda; eat more vegetables; eat foods that are low in salt; eat baked foods instead of fried foods; eat fruit for snacks and desserts  Be physically active find an activity I enjoy  Prevent falls have my vision checked  Meeting treatment goals maintain the current self-care plan    No flowsheet data found.   Care Management & Community Referrals:  Referral 01/25/2013  Referrals made to community resources exercise/physical therapy

## 2016-08-11 NOTE — Progress Notes (Signed)
   CC: Hospital follow up for sigmoid volvulus  HPI:  [name] [name] is a 82 y.o. man with PMHx as noted below who presents today for hospital follow up for sigmoid volvulus. His daughter accompanied him and provides most of the history due to his Alzheimer's dementia.  Sigmoid Volvulus: He was hospitalized from 11/13-11/19 after presenting with altered mental status and 1 day hx abdominal pain. He was found to have sigmoid volvulus with minimal pneumatosis on CT abdomen/pelvis. His symptoms improved after undergoing sigmoid decompression x 2.   Today, daughter reports she thinks his abdomen is getting tight again. Patient denies any abdominal pain, nausea, or vomiting. Daughter notes he had a bowel movement this morning that was mostly mucus. Daughter is worried about her father having to be hospitalized again for volvulus. She states she does not want her father to go through surgery is possible, especially if he is going to require a colostomy. Patient states he feels better than when in the hospital.   UTI: He was treated for a UTI given his dirty UA and altered mental status. He completed 7 day course of antibiotics (2 parenteral and then switched to Keflex). Daughter states he took his last dose of Keflex that evening on day of discharge.   Hypokalemia: K as low as 2.7 during hospitalization. K was 3.9 on discharge. He was discharged on K-Dur 40 mEq BID. Daughter states he has been taking K-Dur 40 mEq BID at home with no issues.   Hx PE: On lifelong anticoagulation with warfarin. He had a supratherapeutic INR of 3.83 on admission. INR was 2.96 at time of discharge. He was recommended to take Warfarin 2.5 mg daily. He will follow up in coumadin clinic today. Daughter denies any bleeding, dark stools, or bloody stools.  Past Medical History:  Diagnosis Date  . Anxiety   . Coronary artery disease, non-occlusive    with history of MI; Cath 2008 w multivessel nonobstructive CAD  . Dementia    . Depression   . GERD (gastroesophageal reflux disease)   . Hyperlipidemia   . Hypertension   . PE (pulmonary embolism)    unprovoked PE completed 6 months of warfarin, warfarin d/ced 02/12/2010, repeat PE 07/10/2010 after a long car ride and now on lifelong coumadin.  . Pituitary microadenoma ([place])    incidental finding CT 12/2009  . Prostate cancer ([place])   . Scoliosis     Review of Systems:  All negative except per HPI  Physical Exam:  Vitals:   08/11/16 0959  BP: 133/85  Pulse: 74  Temp: 98.4 F (36.9 C)  TempSrc: Oral  SpO2: 100%  Weight: 156 lb 4.8 oz (70.9 kg)   General: elderly man sitting up, cooperative, NAD HEENT: [place]/AT, EOMI, sclera anicteric, mucus membranes moist  CV: RRR, no m/g/r Pulm: CTA bilaterally, breaths non-labored Abd: BS+, soft, non-tender, minimal distention Ext: warm, trace peripheral edema Neuro: alert, talks in 2-3 word sentences   Assessment & Plan:   See Encounters Tab for problem based charting.  Patient discussed with Dr. Eppie Gibson

## 2016-08-11 NOTE — Assessment & Plan Note (Signed)
K as low as 2.7 during hospitalization. K was 3.9 on day of discharge. He has been taking K-Dur 40 mEq BID. Will check repeat bmet today.

## 2016-08-11 NOTE — Assessment & Plan Note (Addendum)
Recent hospitalization for sigmoid volvulus s/p 2 sigmoid decompressions. His abdomen appears benign today and vital signs stable. He is not complaining of any abdominal pain, nausea, or vomiting. Daughter states she was told she could call Dr. Osborn Coho office if his abdomen starts to swell again and I encouraged her to do so. I also recommended with the holiday weekend coming up that if he develops abdominal pain or abdominal tightness that she should take her father to the emergency room. She is concerned about her father having reoccurrence of the volvulus and what to do in regards to surgery if that does occur. She does not want her father to have a colostomy bag. She seems open to him having surgery if indicated as long as he has good recovery support such as home health PT, RN. She is his power of attorney. Recommended for her to discuss with other family members and if she does make appointment with GI to discuss this issue with them as well.

## 2016-08-11 NOTE — Patient Instructions (Signed)
Patient instructed to take medications as defined in the Anti-coagulation Track section of this encounter.  Patient instructed to take today's dose.  Patient verbalized understanding of these instructions.    

## 2016-08-12 LAB — BMP8+ANION GAP
ANION GAP: 19 mmol/L — AB (ref 10.0–18.0)
BUN/Creatinine Ratio: 9 — ABNORMAL LOW (ref 10–24)
BUN: 8 mg/dL (ref 8–27)
CALCIUM: 8.9 mg/dL (ref 8.6–10.2)
CHLORIDE: 103 mmol/L (ref 96–106)
CO2: 21 mmol/L (ref 18–29)
Creatinine, Ser: 0.89 mg/dL (ref 0.76–1.27)
GFR calc non Af Amer: 79 mL/min/{1.73_m2} (ref >59)
GFR, EST AFRICAN AMERICAN: 91 mL/min/{1.73_m2} (ref >59)
GLUCOSE: 95 mg/dL (ref 65–99)
POTASSIUM: 4 mmol/L (ref 3.5–5.2)
SODIUM: 143 mmol/L (ref 134–144)

## 2016-08-15 NOTE — Discharge Summary (Signed)
Medicine Attending Discharge Note: I attest to the accuracy of the discharge evaluation and plan as recorded in the final progress note dated 08/09/16 by resident physician Dr Alphonzo Grieve.  82 year old man with mixed Parkinson's and Alzheimer;s dementia who presented  On 08/01/16 with the acute onset of lower abdominal and back pain as well as a change from his baseline mental function. CT of the abdomen consistent with a sigmoid colon vollvulus. Urine appeared infected. He was hypokalemic & hypomagnesemic.  He has a history of pulmonary embolus and is on chronic warfarin anticoagulation.   Hospital course: He was seen in consultation by Gastroenterology. Sigmoidoscopy formed and he was successfully decompressed. No malignant lesions found. He developed recurrent abdominal distension and ileus and had to have a second decompression on 11/17. His condition stabilized and he was able to take PO intake at time of discharge. Electrolytes and Magnesium were replaced to  normal levels. Hypokalemia felt to be a risk factor for his persistent volvulus.  He was treated empirically for a UTI. Initial culture was contaminated.  Anticoagulants were monitored. In view of erratic PO intake and recurrent volvulus, warfarin dose decreased at time of discharge with goal to keep him low therapeutic.  Disposition: Condition improved at time of discharge. Short interval follow up in our medicine clinic. He has a very attentive family who will call for any interim problems. There were no complications.

## 2016-08-17 ENCOUNTER — Telehealth: Payer: Self-pay | Admitting: Internal Medicine

## 2016-08-17 DIAGNOSIS — F0280 Dementia in other diseases classified elsewhere without behavioral disturbance: Secondary | ICD-10-CM | POA: Diagnosis not present

## 2016-08-17 DIAGNOSIS — G20 Parkinson's disease: Secondary | ICD-10-CM | POA: Diagnosis not present

## 2016-08-17 DIAGNOSIS — N12 Tubulo-interstitial nephritis, not specified as acute or chronic: Secondary | ICD-10-CM | POA: Diagnosis not present

## 2016-08-17 DIAGNOSIS — I110 Hypertensive heart disease with heart failure: Secondary | ICD-10-CM | POA: Diagnosis not present

## 2016-08-17 DIAGNOSIS — I5022 Chronic systolic (congestive) heart failure: Secondary | ICD-10-CM | POA: Diagnosis not present

## 2016-08-17 NOTE — Telephone Encounter (Signed)
Spoke w/ daughter, she states pt has not had a BM in at least 1 week, she states he is eating, not big meals but his appetite is good. States he uses several stool softners/ laxatives and has had glycerin supp and an otc enema, with the enema there were brown flakes but mostly mucous. She is very concerned. Have made appt for 11/29 at 1545. Please advise

## 2016-08-17 NOTE — Progress Notes (Signed)
Case discussed with Dr. Rivet soon after the resident saw the patient.  We reviewed the resident's history and exam and pertinent patient test results.  I agree with the assessment, diagnosis and plan of care documented in the resident's note. 

## 2016-08-17 NOTE — Telephone Encounter (Signed)
Thank you helen.  

## 2016-08-17 NOTE — Telephone Encounter (Signed)
Would like nurse to call. (patient daughter)

## 2016-08-18 ENCOUNTER — Ambulatory Visit: Payer: Commercial Managed Care - HMO

## 2016-08-19 ENCOUNTER — Other Ambulatory Visit: Payer: Self-pay | Admitting: Neurology

## 2016-08-19 ENCOUNTER — Telehealth: Payer: Self-pay

## 2016-08-19 DIAGNOSIS — G20 Parkinson's disease: Secondary | ICD-10-CM | POA: Diagnosis not present

## 2016-08-19 DIAGNOSIS — I110 Hypertensive heart disease with heart failure: Secondary | ICD-10-CM | POA: Diagnosis not present

## 2016-08-19 DIAGNOSIS — I5022 Chronic systolic (congestive) heart failure: Secondary | ICD-10-CM | POA: Diagnosis not present

## 2016-08-19 DIAGNOSIS — F0280 Dementia in other diseases classified elsewhere without behavioral disturbance: Secondary | ICD-10-CM | POA: Diagnosis not present

## 2016-08-19 DIAGNOSIS — N12 Tubulo-interstitial nephritis, not specified as acute or chronic: Secondary | ICD-10-CM | POA: Diagnosis not present

## 2016-08-19 NOTE — Telephone Encounter (Signed)
Daughter called concerned about dry skin advised to apply good moisturizer

## 2016-08-23 ENCOUNTER — Other Ambulatory Visit: Payer: Self-pay | Admitting: Internal Medicine

## 2016-08-24 DIAGNOSIS — F0280 Dementia in other diseases classified elsewhere without behavioral disturbance: Secondary | ICD-10-CM | POA: Diagnosis not present

## 2016-08-24 DIAGNOSIS — I110 Hypertensive heart disease with heart failure: Secondary | ICD-10-CM | POA: Diagnosis not present

## 2016-08-24 DIAGNOSIS — G20 Parkinson's disease: Secondary | ICD-10-CM | POA: Diagnosis not present

## 2016-08-24 DIAGNOSIS — N12 Tubulo-interstitial nephritis, not specified as acute or chronic: Secondary | ICD-10-CM | POA: Diagnosis not present

## 2016-08-24 DIAGNOSIS — I5022 Chronic systolic (congestive) heart failure: Secondary | ICD-10-CM | POA: Diagnosis not present

## 2016-08-26 ENCOUNTER — Other Ambulatory Visit: Payer: Self-pay | Admitting: Neurology

## 2016-08-26 NOTE — Telephone Encounter (Signed)
[name] returned RN's call. msg relayed , she will check with pharmacy

## 2016-08-26 NOTE — Telephone Encounter (Signed)
Rn spoke with [facility] on [address]. Rn stated a regular namenda was sent 07/27/2016 and receive at the pharmacy.The pharmacy stated they have the regular namenda on file. They stated the daughter is requesting the namenda xr. Rn stated pts daughter call a month requested the regular namenda because it was cheaper. Pts daughter did not want namenda XR because it was too expensive and he was in the donut hole. Rn stated pt was given 3 refills. The pharmacy will call the daughter.

## 2016-08-26 NOTE — Telephone Encounter (Signed)
[name] called back to advise [facility] called [facility] and they do not have RX for regular namenda. Please call when it has been sent

## 2016-08-27 DIAGNOSIS — I110 Hypertensive heart disease with heart failure: Secondary | ICD-10-CM | POA: Diagnosis not present

## 2016-08-27 DIAGNOSIS — I5022 Chronic systolic (congestive) heart failure: Secondary | ICD-10-CM | POA: Diagnosis not present

## 2016-08-27 DIAGNOSIS — N12 Tubulo-interstitial nephritis, not specified as acute or chronic: Secondary | ICD-10-CM | POA: Diagnosis not present

## 2016-08-27 DIAGNOSIS — F0280 Dementia in other diseases classified elsewhere without behavioral disturbance: Secondary | ICD-10-CM | POA: Diagnosis not present

## 2016-08-27 DIAGNOSIS — G20 Parkinson's disease: Secondary | ICD-10-CM | POA: Diagnosis not present

## 2016-08-27 NOTE — Telephone Encounter (Signed)
Rn spoke with daughter she is going to pick up the plain namenda now. She does not need the XR namenda. She will call back if the regular namenda is not effective.

## 2016-08-30 ENCOUNTER — Ambulatory Visit (INDEPENDENT_AMBULATORY_CARE_PROVIDER_SITE_OTHER): Payer: Commercial Managed Care - HMO | Admitting: Pulmonary Disease

## 2016-08-30 DIAGNOSIS — I10 Essential (primary) hypertension: Secondary | ICD-10-CM | POA: Diagnosis not present

## 2016-08-30 DIAGNOSIS — R222 Localized swelling, mass and lump, trunk: Secondary | ICD-10-CM

## 2016-08-30 DIAGNOSIS — Z79899 Other long term (current) drug therapy: Secondary | ICD-10-CM | POA: Diagnosis not present

## 2016-08-30 NOTE — Patient Instructions (Signed)
Follow up in 2-3 weeks with your primary care provider

## 2016-08-30 NOTE — Progress Notes (Signed)
   CC: left chest lump  HPI:  [name] [name] is a 82 y.o. with history as outlined below presenting for evaluation of left chest lump.  History provided by daughter due to dementia. Lump on chest first noticed Thursday night by daughter. Asymptomatic.  Past Medical History:  Diagnosis Date  . Anxiety   . Coronary artery disease, non-occlusive    with history of MI; Cath 2008 w multivessel nonobstructive CAD  . Dementia   . Depression   . GERD (gastroesophageal reflux disease)   . Hyperlipidemia   . Hypertension   . PE (pulmonary embolism)    unprovoked PE completed 6 months of warfarin, warfarin d/ced 02/12/2010, repeat PE 07/10/2010 after a long car ride and now on lifelong coumadin.  . Pituitary microadenoma ([place])    incidental finding CT 12/2009  . Prostate cancer ([place])   . Scoliosis     Review of Systems:   Denies fevers or chills Denies chest pain  Physical Exam:  Vitals:   08/30/16 1525  BP: 140/88  Pulse: 98  Temp: 98 F (36.7 C)  TempSrc: Oral  SpO2: 100%  Weight: 150 lb (68 kg)   General Apperance: NAD HEENT: Normocephalic, atraumatic, anicteric sclera Neck: Supple, trachea midline Lungs: Clear to auscultation bilaterally. No wheezes, rhonchi or rales. Breathing comfortably Heart: Regular rate and rhythm, no murmur/rub/gallop Abdomen: Soft, nontender, nondistended, no rebound/guarding Extremities: Warm and well perfused, no edema Skin: Oval shaped, firm mobile mass just under skin that has defined borders located left mid parasternal. Nontender. Neurologic: Alert and interactive. No gross deficits.  Assessment & Plan:   See Encounters Tab for problem based charting.  Patient discussed with Dr. Lynnae January

## 2016-08-31 DIAGNOSIS — R222 Localized swelling, mass and lump, trunk: Secondary | ICD-10-CM | POA: Insufficient documentation

## 2016-08-31 DIAGNOSIS — F0280 Dementia in other diseases classified elsewhere without behavioral disturbance: Secondary | ICD-10-CM | POA: Diagnosis not present

## 2016-08-31 DIAGNOSIS — G20 Parkinson's disease: Secondary | ICD-10-CM | POA: Diagnosis not present

## 2016-08-31 DIAGNOSIS — I110 Hypertensive heart disease with heart failure: Secondary | ICD-10-CM | POA: Diagnosis not present

## 2016-08-31 DIAGNOSIS — I5022 Chronic systolic (congestive) heart failure: Secondary | ICD-10-CM | POA: Diagnosis not present

## 2016-08-31 DIAGNOSIS — N12 Tubulo-interstitial nephritis, not specified as acute or chronic: Secondary | ICD-10-CM | POA: Diagnosis not present

## 2016-08-31 NOTE — Assessment & Plan Note (Signed)
Assessment: area of mass and shape is consistent with loop recorder that was implanted in 2016 in the left parasternal region  Plan:  Reassured patient's daughter Follow up in 2-3 weeks for routine follow up

## 2016-08-31 NOTE — Assessment & Plan Note (Signed)
Assessment: BP at goal at 140/88 today.  Plan: Continue amlodipine 2.5mg  daily

## 2016-09-03 DIAGNOSIS — G20 Parkinson's disease: Secondary | ICD-10-CM | POA: Diagnosis not present

## 2016-09-03 DIAGNOSIS — N12 Tubulo-interstitial nephritis, not specified as acute or chronic: Secondary | ICD-10-CM | POA: Diagnosis not present

## 2016-09-03 DIAGNOSIS — I110 Hypertensive heart disease with heart failure: Secondary | ICD-10-CM | POA: Diagnosis not present

## 2016-09-03 DIAGNOSIS — F0280 Dementia in other diseases classified elsewhere without behavioral disturbance: Secondary | ICD-10-CM | POA: Diagnosis not present

## 2016-09-03 DIAGNOSIS — I5022 Chronic systolic (congestive) heart failure: Secondary | ICD-10-CM | POA: Diagnosis not present

## 2016-09-03 NOTE — Progress Notes (Signed)
Internal Medicine Clinic Attending  Case discussed with Dr. Krall at the time of the visit.  We reviewed the resident's history and exam and pertinent patient test results.  I agree with the assessment, diagnosis, and plan of care documented in the resident's note.  

## 2016-09-06 ENCOUNTER — Ambulatory Visit (INDEPENDENT_AMBULATORY_CARE_PROVIDER_SITE_OTHER): Payer: Commercial Managed Care - HMO | Admitting: *Deleted

## 2016-09-06 ENCOUNTER — Ambulatory Visit (INDEPENDENT_AMBULATORY_CARE_PROVIDER_SITE_OTHER): Payer: Commercial Managed Care - HMO | Admitting: Pharmacist

## 2016-09-06 DIAGNOSIS — I2782 Chronic pulmonary embolism: Secondary | ICD-10-CM

## 2016-09-06 DIAGNOSIS — R55 Syncope and collapse: Secondary | ICD-10-CM | POA: Diagnosis not present

## 2016-09-06 DIAGNOSIS — Z7902 Long term (current) use of antithrombotics/antiplatelets: Secondary | ICD-10-CM

## 2016-09-06 DIAGNOSIS — Z7901 Long term (current) use of anticoagulants: Secondary | ICD-10-CM

## 2016-09-06 LAB — POCT INR: INR: 1.8

## 2016-09-06 NOTE — Patient Instructions (Signed)
Patient instructed to take medications as defined in the Anti-coagulation Track section of this encounter.  Patient instructed to take today's dose.  Patient instructed to take warfarin 5mg  strength peach colored tablet as follows:  One (1) tablet on Mondays, Tuesdays, Wednesdays and Fridays; all other days--just 1/2 tablet. Patient verbalized understanding of these instructions.

## 2016-09-06 NOTE — Progress Notes (Signed)
Anti-Coagulation Progress Note  [name] [name] is a 82 y.o. male who is currently on an anti-coagulation regimen.    RECENT RESULTS: Recent results are below, the most recent result is correlated with a dose of 25 mg. per week: Lab Results  Component Value Date   INR 1.80 09/06/2016   INR 2.0 08/11/2016   INR 2.96 08/08/2016    ANTI-COAG DOSE: Anticoagulation Dose Instructions as of 09/06/2016      [name] Tue Wed Thu Fri Sat   New Dose 2.5 mg 5 mg 5 mg 5 mg 2.5 mg 5 mg 2.5 mg    Description   Give one (1) tablet by mouth on Mondays, Tuesdays, Wednesdays and Fridays; on all other days--give only 1/2 tablet.       ANTICOAG SUMMARY: Anticoagulation Episode Summary    Current INR goal:   2.0-3.0  TTR:   71.2 % (5.7 y)  Next INR check:   10/11/2016  INR from last check:   1.80! (09/06/2016)  Weekly max dose:     Target end date:   Indefinite  INR check location:   [facility]  Preferred lab:     Send INR reminders to:   ANTICOAG IMP   Indications   Pulmonary embolism ([place]) [I26.99] Encounter for long-term use of antiplatelets/antithrombotics [Z79.02]       Comments:   History of multiple venous embolic episodes. Will continue to annual re-evaluate continued need for warfarin weighing risks vs. benefits.       Anticoagulation Care Providers    Provider Role Specialty Phone number   Bertha Stakes, MD  Internal Medicine [phone]      ANTICOAG TODAY: Anticoagulation Summary  As of 09/06/2016   INR goal:   2.0-3.0  TTR:     Today's INR:   1.80!  Next INR check:   10/11/2016  Target end date:   Indefinite   Indications   Pulmonary embolism ([place]) [I26.99] Encounter for long-term use of antiplatelets/antithrombotics [Z79.02]        Anticoagulation Episode Summary    INR check location:   [facility]   Preferred lab:      Send INR reminders to:   ANTICOAG IMP   Comments:   History of multiple venous embolic episodes. Will continue to annual re-evaluate  continued need for warfarin weighing risks vs. benefits.     Anticoagulation Care Providers    Provider Role Specialty Phone number   Bertha Stakes, MD  Internal Medicine [phone]      PATIENT INSTRUCTIONS: Patient instructed to take medications as defined in the Anti-coagulation Track section of this encounter.  Patient instructed to take today's dose.  Patient instructed to take warfarin 5mg  strength peach colored tablet as follows:  One (1) tablet on Mondays, Tuesdays, Wednesdays and Fridays; all other days--just 1/2 tablet. Patient verbalized understanding of these instructions.      FOLLOW-UP Return in 5 weeks (on 10/11/2016) for Follow up INR at [place], [name] Pharm.D., CACP

## 2016-09-06 NOTE — Progress Notes (Signed)
INTERNAL MEDICINE TEACHING ATTENDING ADDENDUM - Aldine Contes M.D  Duration- indefinite, Indication- recurrent PE, INR- sub therapeutic. Agree with pharmacy recommendations as outlined in their note.

## 2016-09-07 DIAGNOSIS — G20 Parkinson's disease: Secondary | ICD-10-CM | POA: Diagnosis not present

## 2016-09-07 DIAGNOSIS — F0280 Dementia in other diseases classified elsewhere without behavioral disturbance: Secondary | ICD-10-CM | POA: Diagnosis not present

## 2016-09-07 DIAGNOSIS — I5022 Chronic systolic (congestive) heart failure: Secondary | ICD-10-CM | POA: Diagnosis not present

## 2016-09-07 DIAGNOSIS — I110 Hypertensive heart disease with heart failure: Secondary | ICD-10-CM | POA: Diagnosis not present

## 2016-09-07 DIAGNOSIS — N12 Tubulo-interstitial nephritis, not specified as acute or chronic: Secondary | ICD-10-CM | POA: Diagnosis not present

## 2016-09-07 NOTE — Progress Notes (Signed)
Carelink Summary Report / Loop Recorder 

## 2016-09-10 ENCOUNTER — Encounter: Payer: Self-pay | Admitting: *Deleted

## 2016-09-10 DIAGNOSIS — F0280 Dementia in other diseases classified elsewhere without behavioral disturbance: Secondary | ICD-10-CM | POA: Diagnosis not present

## 2016-09-10 DIAGNOSIS — N12 Tubulo-interstitial nephritis, not specified as acute or chronic: Secondary | ICD-10-CM | POA: Diagnosis not present

## 2016-09-10 DIAGNOSIS — I5022 Chronic systolic (congestive) heart failure: Secondary | ICD-10-CM | POA: Diagnosis not present

## 2016-09-10 DIAGNOSIS — G20 Parkinson's disease: Secondary | ICD-10-CM | POA: Diagnosis not present

## 2016-09-10 DIAGNOSIS — I110 Hypertensive heart disease with heart failure: Secondary | ICD-10-CM | POA: Diagnosis not present

## 2016-09-10 MED FILL — KLOR-CON M20 TABLET: 20 | 30 days supply | Qty: 120 | Fill #1

## 2016-09-10 NOTE — Telephone Encounter (Signed)
This encounter was created in error - please disregard.

## 2016-09-18 LAB — CUP PACEART REMOTE DEVICE CHECK
Implantable Pulse Generator Implant Date: 20160328
MDC IDC SESS DTM: 20171119040700

## 2016-09-18 NOTE — Progress Notes (Signed)
Carelink summary report received. Battery status OK. Normal device function. No new symptom episodes, tachy episodes, brady, or pause episodes. No new AF episodes. Monthly summary reports and ROV/PRN 

## 2016-09-22 ENCOUNTER — Telehealth: Payer: Self-pay | Admitting: Internal Medicine

## 2016-09-22 NOTE — Telephone Encounter (Signed)
APT. REMINDER CALL, LMTCB °

## 2016-09-23 ENCOUNTER — Ambulatory Visit (INDEPENDENT_AMBULATORY_CARE_PROVIDER_SITE_OTHER): Payer: Commercial Managed Care - HMO | Admitting: Internal Medicine

## 2016-09-23 ENCOUNTER — Encounter: Payer: Self-pay | Admitting: Neurology

## 2016-09-23 ENCOUNTER — Ambulatory Visit (INDEPENDENT_AMBULATORY_CARE_PROVIDER_SITE_OTHER): Payer: Commercial Managed Care - HMO | Admitting: Neurology

## 2016-09-23 VITALS — BP 163/94 | HR 61

## 2016-09-23 VITALS — BP 127/77 | HR 70 | Temp 98.3°F | Wt 149.5 lb

## 2016-09-23 DIAGNOSIS — K591 Functional diarrhea: Secondary | ICD-10-CM

## 2016-09-23 DIAGNOSIS — R634 Abnormal weight loss: Secondary | ICD-10-CM | POA: Diagnosis not present

## 2016-09-23 DIAGNOSIS — R5381 Other malaise: Secondary | ICD-10-CM

## 2016-09-23 DIAGNOSIS — E876 Hypokalemia: Secondary | ICD-10-CM

## 2016-09-23 DIAGNOSIS — I10 Essential (primary) hypertension: Secondary | ICD-10-CM

## 2016-09-23 DIAGNOSIS — R197 Diarrhea, unspecified: Secondary | ICD-10-CM | POA: Diagnosis not present

## 2016-09-23 DIAGNOSIS — G214 Vascular parkinsonism: Secondary | ICD-10-CM | POA: Diagnosis not present

## 2016-09-23 MED ORDER — MEMANTINE HCL ER 28 MG PO CP24: 28.0000 mg | ORAL_CAPSULE | ORAL | 6 refills | Status: DC

## 2016-09-23 MED ORDER — DOCUSATE SODIUM 100 MG PO CAPS: 100.0000 mg | ORAL_CAPSULE | Freq: Every day | ORAL | 1 refills | Status: DC

## 2016-09-23 NOTE — Patient Instructions (Addendum)
[name] [name],  It was a pleasure to meet you today.  I would recommend trying to get nutrition with any form he will tolerate. If you can find a protein supplement shake that he likes or anything that he is willing to eat that will be good for him.  Hold off on the blood pressure medications and keep a log. We will see you back in a month and see how his blood pressures are doing. If you notice his blood pressure is consistently over 150/90, please let us know.  He should have bowel moments at least every other day. If he has not had any bowel moments in 2 days try the miralax. If he starts having abdominal pain or his belly becomes distended then please call us or go to the ED. I am going to give you a prescription for a stool softener, Colace to take daily.  I am going to check his potassium today. I will call you in the next few days when the labs come back with the results.  Follow up in a month.

## 2016-09-23 NOTE — Progress Notes (Signed)
   CC: Diarrhea  HPI:  [name] is a 83 y.o. male with a past medical history listed below here today with his daughter for follow up on his weight loss and has complaints of diarrhea.   Weight Loss - Has not been eating well at home. Refusing to eat. Has been on ensure during hospitalizations but has refused since going home. Weight today is 149 lbs. Previous visit a month ago weight was 150 lbs.   Loose Stools - Was taking miralax at home. Was getting it once a day with juice in the morning after his volvulus. Has been having loose stools ever since starting the Miralax. They backed off on the Miralax this week and last dose was 2 days ago. Reports being told to take Colace but never received the prescription. Has not had a normal bowel movement since the voluvus. Denies any abdominal pain today and daughter reports he never complains of abdominal pain since the volvus occurred.  Had a bowel movement on Saturday and did not have another one until today. Appears that patient's son was concerned about his diarrhea and gave him 2 Imodium on Sunday. No melena or hematochezia. No nausea/vomiting.   Hypokalemia - Was noted to be hypokalemic and hypomagnesemic at prior hospitalization. Currently on KDur 40 mEq daily. Last BMET on 11/22 with K of 4.0. Denies any current symptoms.   HTN - Staying in the 130/80s at home but does occasionally run high into the 140-150s. Only on amlodipine 2.5 mg daily at home. Daughter reports she has not been giving it to him if his BP is less than 140.   Past Medical History:  Diagnosis Date  . Anxiety   . Coronary artery disease, non-occlusive    with history of MI; Cath 2008 w multivessel nonobstructive CAD  . Dementia   . Depression   . GERD (gastroesophageal reflux disease)   . Hyperlipidemia   . Hypertension   . PE (pulmonary embolism)    unprovoked PE completed 6 months of warfarin, warfarin d/ced 02/12/2010, repeat PE 07/10/2010 after a long car  ride and now on lifelong coumadin.  . Pituitary microadenoma ([facility])    incidental finding CT 12/2009  . Prostate cancer ([place])   . Scoliosis     Review of Systems:   Negative except as noted in HPI  Physical Exam:  Vitals:   09/23/16 1423  BP: 127/77  Pulse: 70  Temp: 98.3 F (36.8 C)  SpO2: 99%  Weight: 149 lb 8 oz (67.8 kg)   General: elderly man sitting up, cooperative, NAD HEENT: [place]/AT, EOMI, sclera anicteric, mucus membranes moist  CV: RRR, no m/g/r Pulm: CTA bilaterally, breaths non-labored Abd: BS+, soft, non-tender, minimal distention Ext: warm, trace peripheral edema Neuro: alert, talks in 2-3 word sentences   Assessment & Plan:   See Encounters Tab for problem based charting.  Patient discussed with Dr. Dareen Piano

## 2016-09-23 NOTE — Assessment & Plan Note (Signed)
Was noted to be hypokalemic and hypomagnesemic at prior hospitalization. Currently on KDur 40 mEq daily. Last BMET on 11/22 with K of 4.0. Denies any current symptoms.   Plan Checking bmet and mag today

## 2016-09-23 NOTE — Assessment & Plan Note (Signed)
Has not been eating well at home. Refusing to eat. Has been on ensure during hospitalizations but has refused since going home. Weight today is 149 lbs. Previous visit a month ago weight was 150 lbs.   Plan Encourage PO intake and supplemental protein shakes as needed

## 2016-09-23 NOTE — Patient Instructions (Signed)
I had a long discussion with the patient and her daughter regarding his parkinsonian syndrome and dementia which appear to be stable. Agree with switching back to Namenda etc. 28 mg as he did better on that compared to Namenda 10 mg twice daily. Continue Sinemet immediate release 1 tablet 4 times daily. Continue Seroquel at night for agitation and sleep. Continue warfarin for stroke prevention. Patient will return for follow-up in 6 months or call earlier if necessary

## 2016-09-23 NOTE — Assessment & Plan Note (Signed)
Staying in the 130/80s at home but does occasionally run high into the 140-150s. Only on amlodipine 2.5 mg daily at home. Daughter reports she has not been giving it to him if his BP is less than 140.  Plan Stop amlodipine Check bp at home and rtc in 2-4 weeks for bp recheck

## 2016-09-23 NOTE — Progress Notes (Signed)
[name] DOB: 1931-05-05  REASON FOR VISIT: routine follow up for Alzheimer's and Parkinson's HISTORY FROM: patient  HISTORY OF PRESENT ILLNESS: UPDATE 01/23/14 (LL):  MMSE today is 23/30, AFT 9 and Clock drawing 1/4. He is tolerating Sinemet tid and Namenda well.  Mood is better on Zoloft. Having difficulty sleeping.  Does not nap during the day.  Has difficulty getting to sleep and wakes frequently.  Has tried Melatonin in the past without benefit.  He used Nortriptyline in the past for insomnia and pain, but at 100 mg dose became toxic (unresponsive).  Daughter notices "sundowning" at night, more confused in nighttime hours.   UPDATE 06/23/13 (LL): [name] [name] comes back for 2 month revisit. Last visit he was started on Sinemet to see if he would have any benefit. Both he and his daughter think that he is moving better since on it. Daughter states that he is no longer "freezing" when he tries to take a step. He is currently taking it twice a day, once when he gets up (around 7-8) and then again at 5 pm. He has no new complaints. MMSE today is 27/30, AFT 8 and Clock drawing 1/4.   UPDATE 05/24/13 (LL): Patient returns for followup. After starting Aricept at last visit daughter reports that he became very weak, confused, and had to be hospitalized. Was found to be bradycardic and symptomatic with heart rate in the 40s, attributable to the Aricept. Medication was stopped and patient was discharged to rehabilitation for short time and has since returned home. Daughter states that he has adjusted somewhat to having moved in with her, is taking Zoloft for depression, and has disinterest in getting out of the house much. States that sometimes it is hard for him to pick up his foot to take a step and it seems to be when he is nervous. Also seems to have tremor when nervous. Has had trouble with insomnia was started on melatonin 5 mg at bedtime with no response. Daughter feels like he has  improved some in his cognitive ability since last visit. MMSE last visit was 23/30. MMSE today is 25/30 with deficits in recall. Clock drawing 0/4. AFT 10. Geriatric depression score is 3 does not suggest depression.   PRIOR HPI 02/05/13 (PS): 80 male with remote right hemispheric TIA in May 2011 and mild cognitive impairment. He returns today for followup after his last visit with me on 01/05/2012. Continues to do well from a neurovascular standpoint and has not had a any further stroke or TIAs. He remains on warfarin and is tolerating it well without significant bleeding, bruising or the side effects. He states his blood pressure is under good control and it is 124/76 in office today. He however is had increasing confusion and memory difficulties for the last 1 year which have been progressive. He now needs help with taking his medications. He has stopped going out. He in fact wandered out a few weeks ago from his home and needed help to get back. He denies any agitation or caval changes. He has no significant hallucination or other lesions. His gait and balance are fine and there have been no safety issues. He has now moved and to live with his daughter for the last 2-3 months. The patient did undergo labs for treatable causes of cognitive impairment at last visit and vitamin B12, TSH and RPR were normal on 01/05/2012. His mother did have Alzheimer's.  Update 10/23/2014 : He returns for follow-up after  last visit 9 months ago. He is accompanied by his daughter who provides most of the history. Patient feels he is about the same and the daughter agrees. He continues to have poor short-term memory and needs help with activities like his medications and food. He can dress himself and uses a toilet on most occasions with only occasional help. He sometimes has trouble walking and states that his legs don't move but once he gets started he can walk fairly well. He walks with a stooped posture but his balance is not bad  and he is not had any recent falls. He remains on Namenda XR 28 mg daily which he is tolerating well. He had been on Aricept for years but developed slow heart rate requiring it to be stopped. He has had no interval health problems. He hasn't not been having any significant hallucinations, delusions, agitation or safety concerns. He does get anxious occasionally. The patient's other daughter wants him and his wife to come and stay with her for 3 months and the family is contemplating whether that would be a good move or not Update 04/23/2015 : He returns for follow-up after last visit 6 months ago. He is accompanied by his daughter. The patient was hospitalized in March with syncopal episodes with hypertension and low heart rate. His blood pressure medications were adjusted and tapered and lisinopril and Lasix were discontinued. He undergo a heart monitor for a few days. He is doing better now but blood pressure running in the 102H systolic range. However his been feeling weak all over as has not been walking a lot. He has not been using his walker. He feels his leg is slow and stiff and stick to the ground. He is walking difficulty to the more pronounced when is anxious. He has noticed previously some benefit after adding Sinemet which he takes 25/100 one tablet 3 times daily. Daughter has not noticed any significant further cognitive decline however on Mini-Mental status exam testing today she scored 17 out of 30 which is a significant drop from  22/30 at last visit. He remains on Namenda etc. and in the past has not been able to tolerate Aricept due to his heart rate being slow . Update 09/23/2015 : He returns for follow-up with his last visit with me 5 months ago. He is accompanied by his daughter who provides most of the history. Patient continues to have cognitive decline as well as problems with gait and balance. He remains on Sinemet which he takes in the morning when he gets up as well as close to bedtime.  His daughter feels that towards the evening time his legs dragging and he is unable to move them as well and perhaps he should take the evening dose earlier. He is also had increasing agitation at night and not being able to sleep and he does Nap during the day a lot. He was unable to afford Namenda etc. due to being in the doughnut hole and when he was switched to generic Namenda 10 mg twice daily he seems not to have done as well. On Mini-Mental status score testing today score 9/30 which is a significant decline from 17/30 at last visit. He was seen by Dr. 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 The patient was tried on trazodone but it made him too sleepy during the day and he took only 1 dose. Patient  has recently started home physical therapy. The patient's daughter seems extremely committed to keeping him at home and is looking at options to hire a caregiver Update 03/24/2016 ; he returns for follow-up after last visit 6 months ago. He is accompanied by his son and 2 daughters. Patient congested do poorly and family has noticed worsening of his gait and balance. His shuffling a lot more now and needs a walker and close supervision. He is had a few falls but fortunately not hurt himself. Patient had agitation at night and family had called earlier and hadn't increased her dose of Seroquel to 25 mg at night which works off and on. The family does admit that his sleep-wake cycle may be off and he does sleep a lot during the day and then have trouble at night. Patient now has 24-hour care at home. He was hospitalized in March this year with intestinal obstruction and following surgery is still not having regular bowel movements. The family is applied for more nursing help at home and are requesting a letter of medical necessity from me. He does get confused more frequently now  particularly at night that is sundowning. There have been no definite no delusions or hallucinations  or unsafe behavior Update 09/23/2016 : He returns for follow-up after last 6 months ago. He is had significant general medical decline in his condition. He has been hospitalized multiple times with urine tract infection as well as sigmoid volvulus and pulmonary embolism as well as hypokalemia. He requires essential. Would at home. He continues to stay at home with his daughter. He has nursing help during the day while the daughter is working but that night the daughter manages by herself. She has some minimum support from her brother as well as start her. Patient was switched to Namenda 10 mg twice daily upon request from the daughters should he was in the doughnut hole and could not afford it but daughter feels that Namenda etc. work better and she wants him to be switch back to it. Patient didn't not take Neupro patch which I ordered at last visit as he could not afford it. He is on Sinemet immediate release 1 tablet 4 times daily. He sleeps through the night and Seroquel at night seems to help his agitation and sleep as well. There have been no falls or injuries. REVIEW OF SYSTEMS: Full 14 system review of systems performed and notable only for:  Constipation, , Diarrhea, gait difficulty agitation and all other systems negative   ALLERGIES: Allergies  Allergen Reactions  . Aricept [Donepezil Hcl] Other (See Comments)    Symptomatic Bradycardia.  . Other Other (See Comments)    Shrimp gives him gout  . Shellfish-Derived Products Other (See Comments)    Causes a flare up with Gout    HOME MEDICATIONS: Outpatient Medications Prior to Visit  Medication Sig Dispense Refill  . acetaminophen (TYLENOL) 500 MG tablet Take 500 mg by mouth every 4 (four) hours as needed for mild pain or headache.    [name] amLODipine (NORVASC) 2.5 MG tablet TAKE 1 TABLET (2.5 MG TOTAL) BY MOUTH DAILY. 90 tablet 0  . aspirin  81 MG chewable tablet Chew 81 mg by mouth daily.    [name] b complex vitamins tablet Take 1 tablet by mouth every morning.     . Ca Phosphate-Cholecalciferol (CALCIUM/VITAMIN D3 GUMMIES) 250-350 MG-UNIT CHEW Chew 1 Dose by mouth 2 (two) times daily. 180 tablet 3  . carbidopa-levodopa (SINEMET IR) 25-100 MG tablet Take  1 tablet by mouth 4 (four) times daily. 120 tablet 2  . docusate sodium (COLACE) 100 MG capsule Take 1 capsule (100 mg total) by mouth daily. 30 capsule 1  . feeding supplement, ENSURE ENLIVE, (ENSURE ENLIVE) LIQD Take 237 mLs by mouth 3 (three) times daily between meals. 45 Bottle 12  . fish oil-omega-3 fatty acids 1000 MG capsule Take 2 g by mouth every morning.     . Multiple Vitamins-Minerals (MULTIVITAMIN WITH MINERALS) tablet Take 1 tablet by mouth every morning.     . polyethylene glycol (MIRALAX / GLYCOLAX) packet Take 17 g by mouth daily as needed. (Patient taking differently: Take 17 g by mouth daily as needed for mild constipation. ) 14 each 1  . potassium chloride SA (KLOR-CON M20) 20 MEQ tablet Take 2 tablets (40 mEq total) by mouth 2 (two) times daily. IM program 120 tablet 2  . QUEtiapine (SEROQUEL) 25 MG tablet Take 1 tablet (25 mg total) by mouth at bedtime. 90 tablet 4  . senna (SENOKOT) 8.6 MG TABS tablet Take 1 tablet (8.6 mg total) by mouth daily as needed for mild constipation. 120 each 0  . vitamin C (ASCORBIC ACID) 500 MG tablet Take 1,000 mg by mouth every morning.     . warfarin (COUMADIN) 5 MG tablet Take 0.5 tablets (2.5 mg total) by mouth daily at 6 PM. 40 tablet 2  . memantine (NAMENDA XR) 28 MG CP24 24 hr capsule Take 28 mg by mouth every morning.    . cephALEXin (KEFLEX) 500 MG capsule Take 1 capsule (500 mg total) by mouth every 12 (twelve) hours. Take one dose tonight around 9PM to finish course. (Patient not taking: Reported on 09/23/2016) 1 capsule 0   No facility-administered medications prior to visit.    PHYSICAL EXAM  Vitals:   09/23/16 1614  BP:  (!) 163/94  BP Location: Right Arm  Patient Position: Sitting  Cuff Size: Small  Pulse: 61   There is no height or weight on file to calculate BMI.  Generalized: In no acute distress, pleasant  Frail elderly AA male, well groomed, well developed  Neck: Supple, no carotid bruits  Cardiac: Irregular rate and rhythm, soft 1/6 murmur  Pulmonary: Clear to auscultation bilaterally  Musculoskeletal: mild kyphosis   Neurological examination  MMSE history: 09/23/15 MMSE 9/30 04/23/2015 : MMSE 17/30 10/23/14 MMSE 22/30, AFT 11, Clock 1/4, GDS 5 01/23/14:   MMSE 23/30, recall 1/3, AFT 9 and Clock Drawing 1/4. 06/23/13:  MMSE 27/30, recall 3/3, AFT 8 and Clock drawing 1/4. GDS 5. 02/05/13:  MMSE 25/30, recall 1/3, AFT 10, Clock drawing 0/4. GDS 3.  Mentation: Alert, language fluent.Speech hypophonic and nonfluent Mini-Mental status exam  Not done today. Deficits in orientation, attention, recall and following three-step commands. Animal naming unable. Clock drawing  unable. Mood and affect appropriate. MASKED FACIES.   Cranial nerve II-XII: Pupils were equal round reactive to light extraocular movements were full, visual field were full on confrontational test. facial sensation and strength were normal. hearing was intact to finger rubbing bilaterally. Uvula tongue midline.   MOTOR: normal bulk and tone, full strength in the BUE, BLE, fine finger movements normal, no pronator drift. MILD COGWHEEL RIGIDIITY IN ARMS AND WRISTS, L>R.  SENSORY: normal and symmetric to light touch  COORDINATION: Normal finger-nose-finger, heel-to-shin bilaterally.  REFLEXES: 1+ and symmetric. POSITIVE MYERSON'S, POSITIVE SNOUT, NEGATIVE PALMOMENTAL.  GAIT/STATION: Rising up from seated position without assistance but significant initiation apraxia., STOOPED STANCE, without truncal ataxia, moderate  stride, POOR ARM SWING ON RIGHT, TURNS IN 3 STEPS,  uses a wheeled walker. ASSESSMENT AND PLAN 83 year old AA male patient with    moderate dementia. 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 Remote history of right brain TIA in May 2011 and stable from a neurovascular standpoint.  Vascular Parkinsonism  . PLAN:  I had a long discussion with the patient and her daughter regarding his parkinsonian syndrome and dementia which appear to be stable. Agree with switching back to Namenda etc. 28 mg as he did better on that compared to Namenda 10 mg twice daily. Continue Sinemet immediate release 1 tablet 4 times daily. Continue Seroquel at night for agitation and sleep. Continue warfarin for stroke prevention. Greater than 50% time during this 25 minute visit was spent on counseling and coordination of care about his dementia, parkinsonism and stroke Patient will return for follow-up in 6 months or call earlier if necessary   [name], MD  09/23/2016, 4:48 PM [facility] [address] [place], [place] [address] [phone]  Note: This document was prepared with digital dictation and possible smart phrase technology. Any transcriptional errors that result from this process are unintentional.

## 2016-09-23 NOTE — Assessment & Plan Note (Signed)
Was taking miralax at home. Was getting it once a day with juice in the morning after his volvulus. Has been having loose stools ever since starting the Miralax. They backed off on the Miralax this week and last dose was 2 days ago. Reports being told to take Colace but never received the prescription. Has not had a normal bowel movement since the voluvus. Denies any abdominal pain today and daughter reports he never complains of abdominal pain since the volvus occurred.  Had a bowel movement on Saturday and did not have another one until today. Appears that patient's son was concerned about his diarrhea and gave him 2 Imodium on Sunday. No melena or hematochezia. No nausea/vomiting.  Plan Advised to avoid any further imodium  Concern for possible continued overflow diarrhea Will start colace and use miralax bid as needed  Return precautions for abdominal pain, distention, nausea, vomiting

## 2016-09-24 LAB — BMP8+ANION GAP
ANION GAP: 14 mmol/L (ref 10.0–18.0)
BUN/Creatinine Ratio: 13 (ref 10–24)
BUN: 12 mg/dL (ref 8–27)
CALCIUM: 9.4 mg/dL (ref 8.6–10.2)
CO2: 24 mmol/L (ref 18–29)
CREATININE: 0.96 mg/dL (ref 0.76–1.27)
Chloride: 106 mmol/L (ref 96–106)
GFR calc Af Amer: 84 mL/min/{1.73_m2} (ref >59)
GFR, EST NON AFRICAN AMERICAN: 73 mL/min/{1.73_m2} (ref >59)
Glucose: 67 mg/dL (ref 65–99)
Potassium: 4.7 mmol/L (ref 3.5–5.2)
Sodium: 144 mmol/L (ref 134–144)

## 2016-09-24 LAB — MAGNESIUM: MAGNESIUM: 1.8 mg/dL (ref 1.6–2.3)

## 2016-09-24 NOTE — Addendum Note (Signed)
Addended by: Monette Lions on: 09/24/2016 01:04 PM   Modules accepted: Orders

## 2016-09-27 NOTE — Progress Notes (Signed)
Internal Medicine Clinic Attending  Case discussed with Dr. Boswell at the time of the visit.  We reviewed the resident's history and exam and pertinent patient test results.  I agree with the assessment, diagnosis, and plan of care documented in the resident's note.  

## 2016-09-29 ENCOUNTER — Encounter: Payer: Self-pay | Admitting: Internal Medicine

## 2016-10-01 ENCOUNTER — Telehealth: Payer: Self-pay | Admitting: Cardiology

## 2016-10-01 NOTE — Telephone Encounter (Signed)
LMOVM requesting that pt send manual transmission b/c home monitor has not updated in at least several days.

## 2016-10-05 ENCOUNTER — Encounter (HOSPITAL_COMMUNITY): Payer: Self-pay | Admitting: Family Medicine

## 2016-10-05 ENCOUNTER — Ambulatory Visit (HOSPITAL_COMMUNITY)
Admission: EM | Admit: 2016-10-05 | Discharge: 2016-10-05 | Disposition: A | Discharge status: Home / Self Care | Payer: Commercial Managed Care - HMO | Attending: Family Medicine | Admitting: Family Medicine

## 2016-10-05 DIAGNOSIS — R059 Cough, unspecified: Secondary | ICD-10-CM

## 2016-10-05 DIAGNOSIS — R05 Cough: Secondary | ICD-10-CM

## 2016-10-05 DIAGNOSIS — J208 Acute bronchitis due to other specified organisms: Secondary | ICD-10-CM

## 2016-10-05 MED ORDER — CEPHALEXIN 500 MG PO CAPS: 500.0000 mg | ORAL_CAPSULE | Freq: Three times a day (TID) | ORAL | 0 refills | Status: DC

## 2016-10-05 MED ORDER — CEPHALEXIN 500 MG PO CAPS
500.0000 mg | ORAL_CAPSULE | Freq: Three times a day (TID) | ORAL | 0 refills | Status: DC
Start: 2016-10-05 — End: 2016-11-11

## 2016-10-05 MED ORDER — METHYLPREDNISOLONE 4 MG PO TBPK: ORAL_TABLET | ORAL | 0 refills | Status: DC

## 2016-10-05 NOTE — ED Triage Notes (Signed)
Pt here for wheezing, congestion, SOB. Per son.

## 2016-10-05 NOTE — ED Provider Notes (Signed)
CSN: [id number]     Arrival date & time 10/05/16  1707 History   First MD Initiated Contact with Patient 10/05/16 1811     Chief Complaint  Patient presents with  . Cough   (Consider location/radiation/quality/duration/timing/severity/associated sxs/prior Treatment) Patient is accompanied by son who states he has been having wheezing and congestion for last few days.  Patient has hx of Parkinson's disease and dementia.    The history is provided by the patient.  URI  Presenting symptoms: congestion, cough and fatigue   Severity:  Mild Duration:  2 days Timing:  Constant Progression:  Waxing and waning Chronicity:  New Relieved by:  None tried Worsened by:  Nothing Ineffective treatments:  None tried Associated symptoms: wheezing     Past Medical History:  Diagnosis Date  . Anxiety   . Coronary artery disease, non-occlusive    with history of MI; Cath 2008 w multivessel nonobstructive CAD  . Dementia   . Depression   . GERD (gastroesophageal reflux disease)   . Hyperlipidemia   . Hypertension   . PE (pulmonary embolism)    unprovoked PE completed 6 months of warfarin, warfarin d/ced 02/12/2010, repeat PE 07/10/2010 after a long car ride and now on lifelong coumadin.  . Pituitary microadenoma ([place])    incidental finding CT 12/2009  . Prostate cancer ([place])   . Scoliosis    Past Surgical History:  Procedure Laterality Date  . FLEXIBLE SIGMOIDOSCOPY N/A 08/02/2016   Procedure: FLEXIBLE SIGMOIDOSCOPY;  Surgeon: Ronald Lobo, MD;  Location[facility] ENDOSCOPY;  Service: Endoscopy;  Laterality: N/A;  . FLEXIBLE SIGMOIDOSCOPY N/A 08/06/2016   Procedure: FLEXIBLE SIGMOIDOSCOPY;  Surgeon: Ronald Lobo, MD;  Location: [facility] ENDOSCOPY;  Service: Endoscopy;  Laterality: N/A;  . LEFT HEART CATHETERIZATION WITH CORONARY ANGIOGRAM N/A 12/10/2014   Procedure: LEFT HEART CATHETERIZATION WITH CORONARY ANGIOGRAM;  Surgeon: [name], MD;  Location: [facility] CATH LAB;  Service: Cardiovascular;   Laterality: N/A;  . LOOP RECORDER IMPLANT N/A 12/16/2014   Procedure: LOOP RECORDER IMPLANT;  Surgeon: [name], MD;  Location: [facility] CATH LAB;  Service: Cardiovascular;  Laterality: N/A;  . PROSTATECTOMY     Family History  Problem Relation Age of Onset  . Hypertension Father     Passed away from cerebral hemorrhage at age of 74.  [name] Dementia Mother     Passed away  . Hypertension Child     4 adult children  . Cervical cancer Other   . Lung cancer Other   . Stroke Other   . Heart attack Neg Hx    Social History  Substance Use Topics  . Smoking status: Never Smoker  . Smokeless tobacco: Never Used  . Alcohol use No    Review of Systems  Constitutional: Positive for fatigue.  HENT: Positive for congestion.   Eyes: Negative.   Respiratory: Positive for cough and wheezing.   Cardiovascular: Negative.   Gastrointestinal: Negative.   Endocrine: Negative.   Genitourinary: Negative.   Musculoskeletal: Negative.   Allergic/Immunologic: Negative.   Neurological: Negative.   Hematological: Negative.   Psychiatric/Behavioral: Negative.     Allergies  Aricept [donepezil hcl]; Other; and Shellfish-derived products  Home Medications   Prior to Admission medications   Medication Sig Start Date End Date Taking? Authorizing Provider  acetaminophen (TYLENOL) 500 MG tablet Take 500 mg by mouth every 4 (four) hours as needed for mild pain or headache.    Historical Provider, MD  amLODipine (NORVASC) 2.5 MG tablet TAKE 1 TABLET (2.5  MG TOTAL) BY MOUTH DAILY. 08/24/16   [name], MD  aspirin 81 MG chewable tablet Chew 81 mg by mouth daily.    Historical Provider, MD  b complex vitamins tablet Take 1 tablet by mouth every morning.     Historical Provider, MD  Ca Phosphate-Cholecalciferol (CALCIUM/VITAMIN D3 GUMMIES) 250-350 MG-UNIT CHEW Chew 1 Dose by mouth 2 (two) times daily. 07/08/16   [name], DO  carbidopa-levodopa (SINEMET IR) 25-100 MG tablet Take 1 tablet by mouth 4  (four) times daily. 07/15/16   [name], DO  cephALEXin (KEFLEX) 500 MG capsule Take 1 capsule (500 mg total) by mouth 3 (three) times daily. 10/05/16   Lysbeth Penner, FNP  docusate sodium (COLACE) 100 MG capsule Take 1 capsule (100 mg total) by mouth daily. 09/23/16 09/23/17  Maryellen Pile, MD  feeding supplement, ENSURE ENLIVE, (ENSURE ENLIVE) LIQD Take 237 mLs by mouth 3 (three) times daily between meals. 08/08/16   Alphonzo Grieve, MD  fish oil-omega-3 fatty acids 1000 MG capsule Take 2 g by mouth every morning.     Historical Provider, MD  memantine (NAMENDA XR) 28 MG CP24 24 hr capsule Take 1 capsule (28 mg total) by mouth every morning. 09/23/16   Garvin Fila, MD  methylPREDNISolone (MEDROL DOSEPAK) 4 MG TBPK tablet Take 6-5-4-3-2-1 po qd 10/05/16   Lysbeth Penner, FNP  Multiple Vitamins-Minerals (MULTIVITAMIN WITH MINERALS) tablet Take 1 tablet by mouth every morning.     Historical Provider, MD  polyethylene glycol (MIRALAX / GLYCOLAX) packet Take 17 g by mouth daily as needed. Patient taking differently: Take 17 g by mouth daily as needed for mild constipation.  07/13/16   Velna Ochs, MD  potassium chloride SA (KLOR-CON M20) 20 MEQ tablet Take 2 tablets (40 mEq total) by mouth 2 (two) times daily. IM program 08/10/16   [name], DO  QUEtiapine (SEROQUEL) 25 MG tablet Take 1 tablet (25 mg total) by mouth at bedtime. 06/21/16   Garvin Fila, MD  senna (SENOKOT) 8.6 MG TABS tablet Take 1 tablet (8.6 mg total) by mouth daily as needed for mild constipation. 07/13/16   Velna Ochs, MD  vitamin C (ASCORBIC ACID) 500 MG tablet Take 1,000 mg by mouth every morning.     Historical Provider, MD  warfarin (COUMADIN) 5 MG tablet Take 0.5 tablets (2.5 mg total) by mouth daily at 6 PM. 08/08/16   Alphonzo Grieve, MD   Meds Ordered and Administered this Visit  Medications - No data to display  BP 150/89   Pulse 76   Temp 98.9 F (37.2 C)   SpO2 97%  No data  found.   Physical Exam  Constitutional: He appears well-developed and well-nourished.  HENT:  Head: Normocephalic and atraumatic.  Right Ear: External ear normal.  Left Ear: External ear normal.  Mouth/Throat: Oropharynx is clear and moist.  Eyes: Conjunctivae and EOM are normal. Pupils are equal, round, and reactive to light.  Neck: Normal range of motion. Neck supple.  Cardiovascular: Normal rate, regular rhythm and normal heart sounds.   Pulmonary/Chest: Effort normal and breath sounds normal.  Nursing note and vitals reviewed.   Urgent Care Course   Clinical Course     Procedures (including critical care time)  Labs Review Labs Reviewed - No data to display  Imaging Review No results found.   Visual Acuity Review  Right Eye Distance:   Left Eye Distance:   Bilateral Distance:    Right  Eye Near:   Left Eye Near:    Bilateral Near:         MDM   1. Cough   2. Acute bronchitis due to other specified organisms    Zpak Medrol dose pack as directed 4mg  #21      Lysbeth Penner, FNP 10/05/16 [place]

## 2016-10-06 ENCOUNTER — Ambulatory Visit (INDEPENDENT_AMBULATORY_CARE_PROVIDER_SITE_OTHER): Payer: Commercial Managed Care - HMO | Admitting: *Deleted

## 2016-10-06 DIAGNOSIS — R55 Syncope and collapse: Secondary | ICD-10-CM | POA: Diagnosis not present

## 2016-10-08 ENCOUNTER — Emergency Department (HOSPITAL_COMMUNITY): Payer: Commercial Managed Care - HMO

## 2016-10-08 ENCOUNTER — Telehealth: Payer: Self-pay | Admitting: Internal Medicine

## 2016-10-08 ENCOUNTER — Encounter (HOSPITAL_COMMUNITY): Payer: Self-pay | Admitting: Emergency Medicine

## 2016-10-08 ENCOUNTER — Emergency Department (HOSPITAL_COMMUNITY)
Admission: EM | Admit: 2016-10-08 | Discharge: 2016-10-08 | Disposition: A | Discharge status: Home / Self Care | Payer: Commercial Managed Care - HMO | Attending: Emergency Medicine | Admitting: Emergency Medicine

## 2016-10-08 DIAGNOSIS — I110 Hypertensive heart disease with heart failure: Secondary | ICD-10-CM | POA: Insufficient documentation

## 2016-10-08 DIAGNOSIS — G20 Parkinson's disease: Secondary | ICD-10-CM | POA: Diagnosis not present

## 2016-10-08 DIAGNOSIS — M542 Cervicalgia: Secondary | ICD-10-CM | POA: Diagnosis not present

## 2016-10-08 DIAGNOSIS — S0990XA Unspecified injury of head, initial encounter: Secondary | ICD-10-CM | POA: Insufficient documentation

## 2016-10-08 DIAGNOSIS — Y999 Unspecified external cause status: Secondary | ICD-10-CM | POA: Insufficient documentation

## 2016-10-08 DIAGNOSIS — Z7901 Long term (current) use of anticoagulants: Secondary | ICD-10-CM | POA: Insufficient documentation

## 2016-10-08 DIAGNOSIS — Y939 Activity, unspecified: Secondary | ICD-10-CM | POA: Diagnosis not present

## 2016-10-08 DIAGNOSIS — Y929 Unspecified place or not applicable: Secondary | ICD-10-CM | POA: Diagnosis not present

## 2016-10-08 DIAGNOSIS — W19XXXA Unspecified fall, initial encounter: Secondary | ICD-10-CM

## 2016-10-08 DIAGNOSIS — I5022 Chronic systolic (congestive) heart failure: Secondary | ICD-10-CM | POA: Diagnosis not present

## 2016-10-08 DIAGNOSIS — Z7982 Long term (current) use of aspirin: Secondary | ICD-10-CM | POA: Diagnosis not present

## 2016-10-08 DIAGNOSIS — S0090XA Unspecified superficial injury of unspecified part of head, initial encounter: Secondary | ICD-10-CM | POA: Diagnosis not present

## 2016-10-08 DIAGNOSIS — I2510 Atherosclerotic heart disease of native coronary artery without angina pectoris: Secondary | ICD-10-CM | POA: Insufficient documentation

## 2016-10-08 DIAGNOSIS — W050XXA Fall from non-moving wheelchair, initial encounter: Secondary | ICD-10-CM | POA: Diagnosis not present

## 2016-10-08 DIAGNOSIS — R531 Weakness: Secondary | ICD-10-CM | POA: Diagnosis not present

## 2016-10-08 DIAGNOSIS — Z8546 Personal history of malignant neoplasm of prostate: Secondary | ICD-10-CM | POA: Diagnosis not present

## 2016-10-08 DIAGNOSIS — R404 Transient alteration of awareness: Secondary | ICD-10-CM | POA: Diagnosis not present

## 2016-10-08 LAB — PROTIME-INR
INR: 2.04
PROTHROMBIN TIME: 23.3 s — AB (ref 11.4–15.2)

## 2016-10-08 LAB — CBC
HEMATOCRIT: 39.1 % (ref 39.0–52.0)
Hemoglobin: 12.5 g/dL — ABNORMAL LOW (ref 13.0–17.0)
MCH: 29.6 pg (ref 26.0–34.0)
MCHC: 32 g/dL (ref 30.0–36.0)
MCV: 92.7 fL (ref 78.0–100.0)
Platelets: 175 10*3/uL (ref 150–400)
RBC: 4.22 MIL/uL (ref 4.22–5.81)
RDW: 15.9 % — AB (ref 11.5–15.5)
WBC: 4.6 10*3/uL (ref 4.0–10.5)

## 2016-10-08 LAB — BASIC METABOLIC PANEL
ANION GAP: 9 (ref 5–15)
BUN: 12 mg/dL (ref 6–20)
CALCIUM: 9.3 mg/dL (ref 8.9–10.3)
CO2: 26 mmol/L (ref 22–32)
Chloride: 104 mmol/L (ref 101–111)
Creatinine, Ser: 0.92 mg/dL (ref 0.61–1.24)
Glucose, Bld: 87 mg/dL (ref 65–99)
Potassium: 4.2 mmol/L (ref 3.5–5.1)
SODIUM: 139 mmol/L (ref 135–145)

## 2016-10-08 NOTE — ED Triage Notes (Signed)
PT TO ed FROM HOME WITH FAMILY CALLING 911 AFTER PT FOUND ON FLOOR-- fell out of chair-- no obvious injury, no complaints of pain, pt has hx dementia, parkinson's. Is on COUMADIN. PT is alert--

## 2016-10-08 NOTE — Telephone Encounter (Signed)
APT. REMINDER CALL, LMTCB °

## 2016-10-08 NOTE — Discharge Instructions (Signed)
The ct scans were without any significant abnormality today. There is a small growth on the pituitary gland that has been present for several years. It has not grown or changed and is considered to be benign. You do not need to follow up with anyone about this. Your INR was in the normal range.

## 2016-10-08 NOTE — Progress Notes (Signed)
Carelink Summary Report / Loop Recorder 

## 2016-10-08 NOTE — ED Provider Notes (Signed)
[place] DEPT Provider Note   CSN: [id number] Arrival date & time: 10/08/16  [id number]     History   Chief Complaint Chief Complaint  Patient presents with  . Fall    HPI [name] [name] is a 83 y.o. male for fall.He is brought in by EMS. His daughter who gives the history. He has a history of Parkinson's, history of PE on chronic anticoagulation with Coumadin. He is currently taking prednisone. His daughter states that he has been feeling improved with his movements and tried to get up out of a chair today by himself but fell to the left and hit his face against a wooden bed pole. He did not hit the ground or lose consciousness. There is a level V caveat due to dementia.  HPI  Past Medical History:  Diagnosis Date  . Anxiety   . Coronary artery disease, non-occlusive    with history of MI; Cath 2008 w multivessel nonobstructive CAD  . Dementia   . Depression   . GERD (gastroesophageal reflux disease)   . Hyperlipidemia   . Hypertension   . PE (pulmonary embolism)    unprovoked PE completed 6 months of warfarin, warfarin d/ced 02/12/2010, repeat PE 07/10/2010 after a long car ride and now on lifelong coumadin.  . Pituitary microadenoma ([place])    incidental finding CT 12/2009  . Prostate cancer ([place])   . Scoliosis     Patient Active Problem List   Diagnosis Date Noted  . Loss of weight 09/23/2016  . Chest mass 08/31/2016  . Abdominal distension   . Hypomagnesemia   . Urinary tract infection without hematuria   . Chronic anticoagulation   . Right bundle branch block   . Sigmoid volvulus ([place]) 08/02/2016  . Hypokalemia 08/02/2016  . Hypernatremia 07/15/2016  . Diverticulosis 07/15/2016  . Pyelonephritis 07/09/2016  . Unspecified abnormalities of breathing 07/09/2016  . Delirium   . Statin myopathy 03/15/2016  . Ileus ([place]) 09/15/2015  . Diarrhea   . Vision changes 08/11/2015  . Parkinsonian syndrome ([place]) 08/11/2015  . Finger injury 07/28/2015  . Leukopenia  04/16/2015  . Normocytic anemia, not due to blood loss 04/16/2015  . Chronic systolic heart failure ([place]) 04/16/2015  . Falls frequently 04/16/2015  . Onychomycosis 04/15/2015  . CAD (coronary artery disease) 01/16/2015  . Faintness   . 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 Syncope 12/07/2014  . Healthcare maintenance 10/10/2014  . Somnolence, daytime 10/01/2014  . Acute pain of right hip 02/13/2014  . Lumbosacral spondylosis without myelopathy 09/24/2013  . Bradycardia 02/20/2013  . Generalized ischemic cerebrovascular disease 02/05/2013  . Depression 01/25/2013  . Penile bleeding 01/19/2013  . Constipation 01/20/2012  . Insomnia 01/20/2012  . AKI (acute kidney injury) ([place]) 11/08/2011  . Alzheimer's dementia 08/17/2011  . Pulmonary embolism ([place]) 09/18/2010  . Encounter for long-term use of antiplatelets/antithrombotics 09/18/2010  . DJD (degenerative joint disease) 08/06/2010  . ADENOCARCINOMA, PROSTATE, HX OF 08/06/2010  . HLD (hyperlipidemia) 10/15/2009  . Essential hypertension, benign 10/15/2009  . GERD 10/15/2009    Past Surgical History:  Procedure Laterality Date  . FLEXIBLE SIGMOIDOSCOPY N/A 08/02/2016   Procedure: FLEXIBLE SIGMOIDOSCOPY;  Surgeon: Ronald Lobo, MD;  Location: [facility] ENDOSCOPY;  Service: Endoscopy;  Laterality: N/A;  . FLEXIBLE SIGMOIDOSCOPY N/A 08/06/2016   Procedure: FLEXIBLE SIGMOIDOSCOPY;  Surgeon: Ronald Lobo, MD;  Location: [facility] ENDOSCOPY;  Service: Endoscopy;  Laterality: N/A;  . LEFT HEART CATHETERIZATION WITH CORONARY ANGIOGRAM N/A 12/10/2014   Procedure: LEFT HEART CATHETERIZATION WITH CORONARY ANGIOGRAM;  Surgeon:  [name], MD;  Location: [facility] CATH LAB;  Service: Cardiovascular;  Laterality: N/A;  . LOOP RECORDER IMPLANT N/A 12/16/2014   Procedure: LOOP RECORDER IMPLANT;  Surgeon: [name], MD;  Location: [facility] CATH LAB;  Service: Cardiovascular;  Laterality: N/A;  . PROSTATECTOMY         Home Medications    Prior to Admission  medications   Medication Sig Start Date End Date Taking? Authorizing Provider  acetaminophen (TYLENOL) 500 MG tablet Take 500 mg by mouth every 4 (four) hours as needed for mild pain or headache.    Historical Provider, MD  amLODipine (NORVASC) 2.5 MG tablet TAKE 1 TABLET (2.5 MG TOTAL) BY MOUTH DAILY. 08/24/16   [name], MD  aspirin 81 MG chewable tablet Chew 81 mg by mouth daily.    Historical Provider, MD  b complex vitamins tablet Take 1 tablet by mouth every morning.     Historical Provider, MD  Ca Phosphate-Cholecalciferol (CALCIUM/VITAMIN D3 GUMMIES) 250-350 MG-UNIT CHEW Chew 1 Dose by mouth 2 (two) times daily. 07/08/16   [name], DO  carbidopa-levodopa (SINEMET IR) 25-100 MG tablet Take 1 tablet by mouth 4 (four) times daily. 07/15/16   [name], DO  cephALEXin (KEFLEX) 500 MG capsule Take 1 capsule (500 mg total) by mouth 3 (three) times daily. 10/05/16   Lysbeth Penner, FNP  docusate sodium (COLACE) 100 MG capsule Take 1 capsule (100 mg total) by mouth daily. 09/23/16 09/23/17  Maryellen Pile, MD  feeding supplement, ENSURE ENLIVE, (ENSURE ENLIVE) LIQD Take 237 mLs by mouth 3 (three) times daily between meals. 08/08/16   Alphonzo Grieve, MD  fish oil-omega-3 fatty acids 1000 MG capsule Take 2 g by mouth every morning.     Historical Provider, MD  memantine (NAMENDA XR) 28 MG CP24 24 hr capsule Take 1 capsule (28 mg total) by mouth every morning. 09/23/16   Garvin Fila, MD  methylPREDNISolone (MEDROL DOSEPAK) 4 MG TBPK tablet Take 6-5-4-3-2-1 po qd 10/05/16   Lysbeth Penner, FNP  Multiple Vitamins-Minerals (MULTIVITAMIN WITH MINERALS) tablet Take 1 tablet by mouth every morning.     Historical Provider, MD  polyethylene glycol (MIRALAX / GLYCOLAX) packet Take 17 g by mouth daily as needed. Patient taking differently: Take 17 g by mouth daily as needed for mild constipation.  07/13/16   Velna Ochs, MD  potassium chloride SA (KLOR-CON M20) 20 MEQ tablet Take 2 tablets  (40 mEq total) by mouth 2 (two) times daily. IM program 08/10/16   [name], DO  QUEtiapine (SEROQUEL) 25 MG tablet Take 1 tablet (25 mg total) by mouth at bedtime. 06/21/16   Garvin Fila, MD  senna (SENOKOT) 8.6 MG TABS tablet Take 1 tablet (8.6 mg total) by mouth daily as needed for mild constipation. 07/13/16   Velna Ochs, MD  vitamin C (ASCORBIC ACID) 500 MG tablet Take 1,000 mg by mouth every morning.     Historical Provider, MD  warfarin (COUMADIN) 5 MG tablet Take 0.5 tablets (2.5 mg total) by mouth daily at 6 PM. 08/08/16   Alphonzo Grieve, MD    Family History Family History  Problem Relation Age of Onset  . Hypertension Father     Passed away from cerebral hemorrhage at age of 63.  [name] Dementia Mother     Passed away  . Hypertension Child     4 adult children  . Cervical cancer Other   . Lung cancer Other   .  Stroke Other   . Heart attack Neg Hx     Social History Social History  Substance Use Topics  . Smoking status: Never Smoker  . Smokeless tobacco: Never Used  . Alcohol use No     Allergies   Aricept [donepezil hcl]; Other; and Shellfish-derived products   Review of Systems Review of Systems  Ten systems reviewed and are negative for acute change, except as noted in the HPI.   Physical Exam Updated Vital Signs BP (!) 166/106 (BP Location: Right Arm)   Pulse 86   Temp 97.9 F (36.6 C) (Oral)   Resp 19   Wt 67.6 kg   SpO2 99%   BMI 22.00 kg/m   Physical Exam  Constitutional: He appears well-developed and well-nourished. No distress.  HENT:  Head: Normocephalic and atraumatic.  Eyes: Conjunctivae are normal. No scleral icterus.  Neck: Normal range of motion. Neck supple.  Cardiovascular: Normal rate, regular rhythm and normal heart sounds.   Pulmonary/Chest: Effort normal and breath sounds normal. No respiratory distress.  Abdominal: Soft. There is no tenderness.  Musculoskeletal: He exhibits no edema.  Neurological: He is alert.    Skin: Skin is warm and dry. He is not diaphoretic.  Psychiatric: His behavior is normal.  Nursing note and vitals reviewed.    ED Treatments / Results  Labs (all labs ordered are listed, but only abnormal results are displayed) Labs Reviewed  CBC - Abnormal; Notable for the following:       Result Value   Hemoglobin 12.5 (*)    RDW 15.9 (*)    All other components within normal limits  PROTIME-INR - Abnormal; Notable for the following:    Prothrombin Time 23.3 (*)    All other components within normal limits  BASIC METABOLIC PANEL    EKG  EKG Interpretation None       Radiology Ct Head Wo Contrast  Result Date: 10/08/2016 CLINICAL DATA:  Fall from wheelchair this morning. History of Parkinson's dementia. Initial encounter. EXAM: CT HEAD WITHOUT CONTRAST CT CERVICAL SPINE WITHOUT CONTRAST TECHNIQUE: Multidetector CT imaging of the head and cervical spine was performed following the standard protocol without intravenous contrast. Multiplanar CT image reconstructions of the cervical spine were also generated. COMPARISON:  08/01/2016 head CT.  Cervical spine CT 03/08/2016 FINDINGS: CT HEAD FINDINGS Brain: No evidence of acute infarction, hemorrhage, hydrocephalus, extra-axial collection or swelling. Generalized atrophy that is mild for age. Extensive chronic microvascular disease with ischemic gliosis throughout cerebral white matter. Remote lacunar infarct in the right thalamus. Pituitary mass sellar expansion. Mass measures up to 15 mm and is essentially stable from 2013. No suprasellar or visible cavernous sinus involvement Vascular: Atherosclerotic calcification. Skull: Negative for fracture. Sinuses/Orbits: Cataract resection on the right at least. No acute finding CT CERVICAL SPINE FINDINGS Alignment:  Mild degenerative scoliosis. Skull base and vertebrae: No acute fracture. Soft tissues and spinal canal: No prevertebral fluid or swelling. No visible canal hematoma. Disc levels:   Diffuse advanced degenerative change. Upper chest: Negative. IMPRESSION: 1. No evidence of acute intracranial or cervical spine injury. 2. Advanced chronic microvascular disease. 3. Pituitary macroadenoma that is stable since at least 2013. Electronically Signed   By: Monte Fantasia M.D.   On: 10/08/2016 11:38   Ct Cervical Spine Wo Contrast  Result Date: 10/08/2016 CLINICAL DATA:  Fall from wheelchair this morning. History of Parkinson's dementia. Initial encounter. EXAM: CT HEAD WITHOUT CONTRAST CT CERVICAL SPINE WITHOUT CONTRAST TECHNIQUE: Multidetector CT imaging of the head and  cervical spine was performed following the standard protocol without intravenous contrast. Multiplanar CT image reconstructions of the cervical spine were also generated. COMPARISON:  08/01/2016 head CT.  Cervical spine CT 03/08/2016 FINDINGS: CT HEAD FINDINGS Brain: No evidence of acute infarction, hemorrhage, hydrocephalus, extra-axial collection or swelling. Generalized atrophy that is mild for age. Extensive chronic microvascular disease with ischemic gliosis throughout cerebral white matter. Remote lacunar infarct in the right thalamus. Pituitary mass sellar expansion. Mass measures up to 15 mm and is essentially stable from 2013. No suprasellar or visible cavernous sinus involvement Vascular: Atherosclerotic calcification. Skull: Negative for fracture. Sinuses/Orbits: Cataract resection on the right at least. No acute finding CT CERVICAL SPINE FINDINGS Alignment:  Mild degenerative scoliosis. Skull base and vertebrae: No acute fracture. Soft tissues and spinal canal: No prevertebral fluid or swelling. No visible canal hematoma. Disc levels:  Diffuse advanced degenerative change. Upper chest: Negative. IMPRESSION: 1. No evidence of acute intracranial or cervical spine injury. 2. Advanced chronic microvascular disease. 3. Pituitary macroadenoma that is stable since at least 2013. Electronically Signed   By: Monte Fantasia M.D.    On: 10/08/2016 11:38    Procedures Procedures (including critical care time)  Medications Ordered in ED Medications - No data to display   Initial Impression / Assessment and Plan / ED Course  I have reviewed the triage vital signs and the nursing notes.  Pertinent labs & imaging results that were available during my care of the patient were reviewed by me and considered in my medical decision making (see chart for details).     Patient with negative CT scans. He is at his baseline mental status. No other signs of injuries. Patient is safe for discharge at this time. Return precautions with the patient's daughter.  Final Clinical Impressions(s) / ED Diagnoses   Final diagnoses:  Fall    New Prescriptions New Prescriptions   No medications on file     Margarita Mail, PA-C 10/08/16 [place], MD 10/11/16 1053

## 2016-10-11 ENCOUNTER — Encounter: Payer: Self-pay | Admitting: Internal Medicine

## 2016-10-11 ENCOUNTER — Ambulatory Visit (INDEPENDENT_AMBULATORY_CARE_PROVIDER_SITE_OTHER): Payer: Medicare HMO | Admitting: Pharmacist

## 2016-10-11 ENCOUNTER — Ambulatory Visit (INDEPENDENT_AMBULATORY_CARE_PROVIDER_SITE_OTHER): Payer: Commercial Managed Care - HMO | Admitting: Internal Medicine

## 2016-10-11 VITALS — BP 158/96 | HR 72 | Temp 98.1°F | Ht 69.0 in | Wt 145.7 lb

## 2016-10-11 DIAGNOSIS — I2782 Chronic pulmonary embolism: Secondary | ICD-10-CM | POA: Diagnosis not present

## 2016-10-11 DIAGNOSIS — R634 Abnormal weight loss: Secondary | ICD-10-CM

## 2016-10-11 DIAGNOSIS — Z7901 Long term (current) use of anticoagulants: Secondary | ICD-10-CM

## 2016-10-11 DIAGNOSIS — M6250 Muscle wasting and atrophy, not elsewhere classified, unspecified site: Secondary | ICD-10-CM | POA: Diagnosis not present

## 2016-10-11 DIAGNOSIS — I10 Essential (primary) hypertension: Secondary | ICD-10-CM

## 2016-10-11 DIAGNOSIS — Z7902 Long term (current) use of antithrombotics/antiplatelets: Secondary | ICD-10-CM

## 2016-10-11 DIAGNOSIS — R296 Repeated falls: Secondary | ICD-10-CM

## 2016-10-11 DIAGNOSIS — M6281 Muscle weakness (generalized): Secondary | ICD-10-CM | POA: Diagnosis not present

## 2016-10-11 LAB — POCT INR: INR: 2.6

## 2016-10-11 NOTE — Patient Instructions (Signed)
Patient instructed to take medications as defined in the Anti-coagulation Track section of this encounter.  Patient instructed to HOLD today's dose.  Patient verbalized understanding of these instructions.      

## 2016-10-11 NOTE — Patient Instructions (Signed)
[name] [name],  I am going to get you set up with home health physical therapy. They should contact you in the next 1-2 days. If you do not hear from them in the next few days, please call us.  I would like for you to continue the amlodipine 2.5 mg daily at home. Take it scheduled every day.  Please follow up with Doctor Heber Kingston in 1-2 months.

## 2016-10-11 NOTE — Progress Notes (Signed)
   CC: HTN follow up  HPI:  Mr.Byrd Rappold is a 83 y.o. male with a past medical history listed below here today for follow up of his HTN. Accompanied by his daughter today. She reports that she stopped the amlodipine for 2-3 days. HIs BP was in the 170/110s after this and she re-started the amlodipine 2.5 mg daily prn. She reports that he is still having days up to 150/90 but has been in the 120-140/80-90 fairly consistently since re-starting the medication. No complaints of headaches, nausea, vomiting, chest pain, palpitations, dizziness/lightheadedness.   He was seen in urgent care on 1/16 for cough and diagnosed with acute bronchitis and started on a course of Keflex and Prednisone at that time. He subsequently had a fall on 1/19 and was seen in the ED. He did strike his head at that time but CT head was done which was negative. Denies any further falls today. No headaches or vision changes.   She is concerned about continued weight loss. Weight is down 4 lbs today from previous visit. Reports good PO intake though at last visit she said he would refused to eat at times. It appears he has been more agreeable to eating now. She also reports getting him Ensure which he likes.   Past Medical History:  Diagnosis Date  . Anxiety   . Coronary artery disease, non-occlusive    with history of MI; Cath 2008 w multivessel nonobstructive CAD  . Dementia   . Depression   . GERD (gastroesophageal reflux disease)   . Hyperlipidemia   . Hypertension   . PE (pulmonary embolism)    unprovoked PE completed 6 months of warfarin, warfarin d/ced 02/12/2010, repeat PE 07/10/2010 after a long car ride and now on lifelong coumadin.  . Pituitary microadenoma ([place])    incidental finding CT 12/2009  . Prostate cancer ([place])   . Scoliosis     Review of Systems:  Negative except as noted in HPI  Physical Exam:  Vitals:   10/11/16 1447  BP: (!) 158/96  Pulse: 72  Temp: 98.1 F (36.7 C)  TempSrc: Oral    SpO2: 97%  Weight: 145 lb 11.2 oz (66.1 kg)  Height: 5\' 9"  (1.753 m)   General: elderly man sitting up, cooperative, NAD HEENT: [place]/AT, EOMI, sclera anicteric, mucus membranes moist  CV: RRR, no m/g/r Pulm: CTA bilaterally, breaths non-labored Abd: BS+, soft, non-tender, minimal distention Ext: warm, trace peripheral edema Neuro: alert, talks in 2-3 word sentences   Assessment & Plan:   See Encounters Tab for problem based charting.  Patient discussed with Dr. Dareen Piano

## 2016-10-11 NOTE — Progress Notes (Signed)
Anti-Coagulation Progress Note  [name] [name] is a 83 y.o. male who is currently on an anti-coagulation regimen.    RECENT RESULTS: Recent results are below, the most recent result is correlated with a dose of 27.5  mg. per week: Lab Results  Component Value Date   INR 2.6 10/11/2016   INR 2.04 10/08/2016   INR 1.80 09/06/2016    ANTI-COAG DOSE: Anticoagulation Dose Instructions as of 10/11/2016      [name] Tue Wed Thu Fri Sat   New Dose 2.5 mg 5 mg 2.5 mg 5 mg 2.5 mg 5 mg 2.5 mg    Description   Give one (1) tablet by mouth on Mondays, Tuesdays, Wednesdays and Fridays; on all other days--give only 1/2 tablet.       ANTICOAG SUMMARY: Anticoagulation Episode Summary    Current INR goal:   2.0-3.0  TTR:   70.4 % (5.8 y)  Next INR check:   10/25/2016  INR from last check:     Weekly max dose:     Target end date:   Indefinite  [facility] check [facility]   [facility]  Preferred lab:     Send INR reminders to:   ANTICOAG IMP   Indications   Pulmonary embolism ([place]) [I26.99] Encounter for long-term use of antiplatelets/antithrombotics [Z79.02]       Comments:   History of multiple venous embolic episodes. Will continue to annual re-evaluate continued need for warfarin weighing risks vs. benefits.       Anticoagulation Care Providers    Provider Role Specialty Phone number   Bertha Stakes, MD  Internal Medicine [phone]      ANTICOAG TODAY: Anticoagulation Summary  As of 10/11/2016   INR goal:   2.0-3.0  TTR:     Today's INR:     Next INR check:   10/25/2016  Target end date:   Indefinite   Indications   Pulmonary embolism ([place]) [I26.99] Encounter for long-term use of antiplatelets/antithrombotics [Z79.02]        Anticoagulation Episode Summary    [facility] check [facility]   [facility]   Preferred lab:      Send INR reminders to:   ANTICOAG IMP   Comments:   History of multiple venous embolic episodes. Will continue to annual re-evaluate continued need for  warfarin weighing risks vs. benefits.     Anticoagulation Care Providers    Provider Role Specialty Phone number   Bertha Stakes, MD  Internal Medicine [phone]    Patient was recently taken to the ED this past Friday for a fall. He was not found to have any bleeding per head MRI or elsewhere. He also recently started taking cephalexin and steroids. Given recent fall and increase in INR from 2.04 on 1/20 to 2.6 today, will have patient hold dose today and decrease weekly warfarin dose by approximately 10%.   PATIENT INSTRUCTIONS: Patient instructed to take medications as defined in the Anti-coagulation Track section of this encounter.  Patient instructed to HOLD today's dose.  Patient verbalized understanding of these instructions.   FOLLOW-UP Return in about 2 weeks (around 10/25/2016) for Follow Up INR on 2/5 at 2:30.  Demetrius Charity, PharmD Acute Care Pharmacy Resident  Pager: [phone] 10/11/2016

## 2016-10-11 NOTE — Progress Notes (Signed)
INTERNAL MEDICINE TEACHING ATTENDING ADDENDUM - Abygale Karpf M.D  Duration- indefinite, Indication- recurrent PE, INR- therapeutic. Agree with pharmacy recommendations as outlined in their note.      

## 2016-10-12 NOTE — Assessment & Plan Note (Signed)
He was seen in urgent care on 1/16 for cough and diagnosed with acute bronchitis and started on a course of Keflex and Prednisone at that time. He subsequently had a fall on 1/19 and was seen in the ED. He did strike his head at that time but CT head was done which was negative. Denies any further falls today. No headaches or vision changes.   A/P Will refer for [facility] PT

## 2016-10-12 NOTE — Assessment & Plan Note (Addendum)
BP Readings from Last 3 Encounters:  10/11/16 (!) 158/96  10/08/16 170/96  10/05/16 150/89    Lab Results  Component Value Date   NA 139 10/08/2016   K 4.2 10/08/2016   CREATININE 0.92 10/08/2016    Assessment: Accompanied by his daughter today. She reports that she stopped the amlodipine for 2-3 days. HIs BP was in the 170/110s after this and she re-started the amlodipine 2.5 mg daily prn. She reports that he is still having days up to 150/90 but has been in the 120-140/80-90 fairly consistently since re-starting the medication. No complaints of headaches, nausea, vomiting, chest pain, palpitations, dizziness/lightheadedness.   Plan: Will continue Amlodipine 2.5 mg daily scheduled instead of prn Bring BP log to next visit

## 2016-10-12 NOTE — Assessment & Plan Note (Signed)
he is concerned about continued weight loss. Weight is down 4 lbs today from previous visit. Reports good PO intake though at last visit she said he would refused to eat at times. It appears he has been more agreeable to eating now. She also reports getting him Ensure which he likes.   A/P Likely secondary to limited caloric intake with his progressing dementia as well as muscle wasting due to deconditioning. Continue to encourage PO intake and caloric supplementation and PT.

## 2016-10-13 ENCOUNTER — Telehealth: Payer: Self-pay

## 2016-10-13 ENCOUNTER — Telehealth: Payer: Self-pay | Admitting: Internal Medicine

## 2016-10-13 DIAGNOSIS — R634 Abnormal weight loss: Secondary | ICD-10-CM | POA: Diagnosis not present

## 2016-10-13 DIAGNOSIS — G309 Alzheimer's disease, unspecified: Secondary | ICD-10-CM | POA: Diagnosis not present

## 2016-10-13 DIAGNOSIS — I5022 Chronic systolic (congestive) heart failure: Secondary | ICD-10-CM | POA: Diagnosis not present

## 2016-10-13 DIAGNOSIS — F0280 Dementia in other diseases classified elsewhere without behavioral disturbance: Secondary | ICD-10-CM | POA: Diagnosis not present

## 2016-10-13 DIAGNOSIS — R296 Repeated falls: Secondary | ICD-10-CM | POA: Diagnosis not present

## 2016-10-13 DIAGNOSIS — I110 Hypertensive heart disease with heart failure: Secondary | ICD-10-CM | POA: Diagnosis not present

## 2016-10-13 NOTE — Telephone Encounter (Signed)
Requesting for orders for pt for  Home health Physical therapy 2x 3 weeks 1x 1 week

## 2016-10-13 NOTE — Telephone Encounter (Signed)
Spoke with Mirna Mires about referral for home for patient she states he does not have home health at this time she is ok with referral to [facility] understands they will contact her by phone

## 2016-10-13 NOTE — Telephone Encounter (Signed)
Attempted call patient regarding home health referral no answer left message to call back

## 2016-10-14 NOTE — Progress Notes (Signed)
Internal Medicine Clinic Attending  Case discussed with Dr. Boswell at the time of the visit.  We reviewed the resident's history and exam and pertinent patient test results.  I agree with the assessment, diagnosis, and plan of care documented in the resident's note.  

## 2016-10-15 DIAGNOSIS — R296 Repeated falls: Secondary | ICD-10-CM | POA: Diagnosis not present

## 2016-10-15 DIAGNOSIS — I110 Hypertensive heart disease with heart failure: Secondary | ICD-10-CM | POA: Diagnosis not present

## 2016-10-15 DIAGNOSIS — G309 Alzheimer's disease, unspecified: Secondary | ICD-10-CM | POA: Diagnosis not present

## 2016-10-15 DIAGNOSIS — R634 Abnormal weight loss: Secondary | ICD-10-CM | POA: Diagnosis not present

## 2016-10-15 DIAGNOSIS — I5022 Chronic systolic (congestive) heart failure: Secondary | ICD-10-CM | POA: Diagnosis not present

## 2016-10-15 DIAGNOSIS — F0280 Dementia in other diseases classified elsewhere without behavioral disturbance: Secondary | ICD-10-CM | POA: Diagnosis not present

## 2016-10-15 NOTE — Telephone Encounter (Signed)
Have called and lm for rtc 

## 2016-10-19 DIAGNOSIS — G309 Alzheimer's disease, unspecified: Secondary | ICD-10-CM | POA: Diagnosis not present

## 2016-10-19 DIAGNOSIS — F0280 Dementia in other diseases classified elsewhere without behavioral disturbance: Secondary | ICD-10-CM | POA: Diagnosis not present

## 2016-10-19 DIAGNOSIS — R296 Repeated falls: Secondary | ICD-10-CM | POA: Diagnosis not present

## 2016-10-19 DIAGNOSIS — I5022 Chronic systolic (congestive) heart failure: Secondary | ICD-10-CM | POA: Diagnosis not present

## 2016-10-19 DIAGNOSIS — I110 Hypertensive heart disease with heart failure: Secondary | ICD-10-CM | POA: Diagnosis not present

## 2016-10-19 DIAGNOSIS — R634 Abnormal weight loss: Secondary | ICD-10-CM | POA: Diagnosis not present

## 2016-10-20 ENCOUNTER — Other Ambulatory Visit: Payer: Self-pay | Admitting: *Deleted

## 2016-10-20 MED ORDER — CARBIDOPA-LEVODOPA 25-100 MG PO TABS: 1.0000 | ORAL_TABLET | Freq: Four times a day (QID) | ORAL | 2 refills | Status: DC

## 2016-10-22 DIAGNOSIS — I5022 Chronic systolic (congestive) heart failure: Secondary | ICD-10-CM | POA: Diagnosis not present

## 2016-10-22 DIAGNOSIS — G309 Alzheimer's disease, unspecified: Secondary | ICD-10-CM | POA: Diagnosis not present

## 2016-10-22 DIAGNOSIS — I110 Hypertensive heart disease with heart failure: Secondary | ICD-10-CM | POA: Diagnosis not present

## 2016-10-22 DIAGNOSIS — R634 Abnormal weight loss: Secondary | ICD-10-CM | POA: Diagnosis not present

## 2016-10-22 DIAGNOSIS — R296 Repeated falls: Secondary | ICD-10-CM | POA: Diagnosis not present

## 2016-10-22 DIAGNOSIS — F0280 Dementia in other diseases classified elsewhere without behavioral disturbance: Secondary | ICD-10-CM | POA: Diagnosis not present

## 2016-10-22 NOTE — Telephone Encounter (Addendum)
Called again, [name] [name] answered and stated she got VO when she called, I repeated orders, PT will be 2xw for 3 weeks and 1xw for 1week Do you agree

## 2016-10-25 ENCOUNTER — Ambulatory Visit: Payer: Commercial Managed Care - HMO

## 2016-10-25 LAB — CUP PACEART REMOTE DEVICE CHECK
Date Time Interrogation Session: 20171219044052
Implantable Pulse Generator Implant Date: 20160328

## 2016-10-26 DIAGNOSIS — F0280 Dementia in other diseases classified elsewhere without behavioral disturbance: Secondary | ICD-10-CM | POA: Diagnosis not present

## 2016-10-26 DIAGNOSIS — I110 Hypertensive heart disease with heart failure: Secondary | ICD-10-CM | POA: Diagnosis not present

## 2016-10-26 DIAGNOSIS — I5022 Chronic systolic (congestive) heart failure: Secondary | ICD-10-CM | POA: Diagnosis not present

## 2016-10-26 DIAGNOSIS — R634 Abnormal weight loss: Secondary | ICD-10-CM | POA: Diagnosis not present

## 2016-10-26 DIAGNOSIS — G309 Alzheimer's disease, unspecified: Secondary | ICD-10-CM | POA: Diagnosis not present

## 2016-10-26 DIAGNOSIS — R296 Repeated falls: Secondary | ICD-10-CM | POA: Diagnosis not present

## 2016-10-26 NOTE — Telephone Encounter (Signed)
Agree 

## 2016-10-27 MED FILL — KLOR-CON M20 TABLET: 20 | 30 days supply | Qty: 120 | Fill #2

## 2016-10-28 ENCOUNTER — Ambulatory Visit: Payer: Commercial Managed Care - HMO | Admitting: Podiatry

## 2016-10-29 DIAGNOSIS — R296 Repeated falls: Secondary | ICD-10-CM | POA: Diagnosis not present

## 2016-10-29 DIAGNOSIS — R634 Abnormal weight loss: Secondary | ICD-10-CM | POA: Diagnosis not present

## 2016-10-29 DIAGNOSIS — F0280 Dementia in other diseases classified elsewhere without behavioral disturbance: Secondary | ICD-10-CM | POA: Diagnosis not present

## 2016-10-29 DIAGNOSIS — I110 Hypertensive heart disease with heart failure: Secondary | ICD-10-CM | POA: Diagnosis not present

## 2016-10-29 DIAGNOSIS — I5022 Chronic systolic (congestive) heart failure: Secondary | ICD-10-CM | POA: Diagnosis not present

## 2016-10-29 DIAGNOSIS — G309 Alzheimer's disease, unspecified: Secondary | ICD-10-CM | POA: Diagnosis not present

## 2016-11-01 ENCOUNTER — Ambulatory Visit (INDEPENDENT_AMBULATORY_CARE_PROVIDER_SITE_OTHER): Payer: Medicare HMO | Admitting: Pharmacist

## 2016-11-01 DIAGNOSIS — Z7902 Long term (current) use of antithrombotics/antiplatelets: Secondary | ICD-10-CM

## 2016-11-01 DIAGNOSIS — I2782 Chronic pulmonary embolism: Secondary | ICD-10-CM

## 2016-11-01 DIAGNOSIS — Z7901 Long term (current) use of anticoagulants: Secondary | ICD-10-CM

## 2016-11-01 LAB — POCT INR: INR: 2.1

## 2016-11-02 DIAGNOSIS — I5022 Chronic systolic (congestive) heart failure: Secondary | ICD-10-CM | POA: Diagnosis not present

## 2016-11-02 DIAGNOSIS — G309 Alzheimer's disease, unspecified: Secondary | ICD-10-CM | POA: Diagnosis not present

## 2016-11-02 DIAGNOSIS — I110 Hypertensive heart disease with heart failure: Secondary | ICD-10-CM | POA: Diagnosis not present

## 2016-11-02 DIAGNOSIS — R296 Repeated falls: Secondary | ICD-10-CM | POA: Diagnosis not present

## 2016-11-02 DIAGNOSIS — F0280 Dementia in other diseases classified elsewhere without behavioral disturbance: Secondary | ICD-10-CM | POA: Diagnosis not present

## 2016-11-02 DIAGNOSIS — R634 Abnormal weight loss: Secondary | ICD-10-CM | POA: Diagnosis not present

## 2016-11-02 NOTE — Progress Notes (Signed)
INTERNAL MEDICINE TEACHING ATTENDING ADDENDUM - [name], DO Duration- indefinate, Indication- VTE, INR-  Lab Results  Component Value Date   INR 2.10 11/01/2016  . Agree with pharmacy recommendations as outlined in their note.

## 2016-11-02 NOTE — Progress Notes (Signed)
Anti-Coagulation Progress Note  [name] [name] is a 83 y.o. male who is currently on an anti-coagulation regimen.    RECENT RESULTS: Recent results are below, the most recent result is correlated with a dose of 25 mg. per week: Lab Results  Component Value Date   INR 2.10 11/01/2016   INR 2.6 10/11/2016   INR 2.04 10/08/2016    ANTI-COAG DOSE: Anticoagulation Dose Instructions as of 11/01/2016      [name] Tue Wed Thu Fri Sat   New Dose 2.5 mg 5 mg 2.5 mg 5 mg 2.5 mg 5 mg 2.5 mg    Description   Give one (1) tablet by mouth on Mondays,  Wednesdays and Fridays; on all other days--give only 1/2 tablet.       ANTICOAG SUMMARY: Anticoagulation Episode Summary    Current INR goal:   2.0-3.0  TTR:   70.7 % (5.8 y)  Next INR check:   11/22/2016  INR from last check:   2.10 (11/01/2016)  Weekly max dose:     Target end date:   Indefinite  [facility]   [facility]  Preferred lab:     Send INR reminders to:   ANTICOAG IMP   Indications   Pulmonary embolism ([place]) [I26.99] Encounter for long-term use of antiplatelets/antithrombotics [Z79.02]       Comments:   History of multiple venous embolic episodes. Will continue to annual re-evaluate continued need for warfarin weighing risks vs. benefits.       Anticoagulation Care Providers    Provider Role Specialty Phone number   Bertha Stakes, MD  Internal Medicine [phone]      ANTICOAG TODAY: Anticoagulation Summary  As of 11/01/2016   INR goal:   2.0-3.0  TTR:     Today's INR:   2.10  Next INR check:   11/22/2016  Target end date:   Indefinite   Indications   Pulmonary embolism ([place]) [I26.99] Encounter for long-term use of antiplatelets/antithrombotics [Z79.02]        Anticoagulation Episode Summary    [facility]   Preferred lab:      Send INR reminders to:   ANTICOAG IMP   Comments:   History of multiple venous embolic episodes. Will continue to annual re-evaluate continued need  for warfarin weighing risks vs. benefits.     Anticoagulation Care Providers    Provider Role Specialty Phone number   Bertha Stakes, MD  Internal Medicine [phone]      PATIENT INSTRUCTIONS: Patient instructed to take medications as defined in the Anti-coagulation Track section of this encounter.  Patient instructed to take today's dose.  Patient's daughter instructed to provide 1 tablet by mouth once-daily at [facility] on Mondays, Wednesdays and Fridays; all other days--provide  1/2 tablet by mouth once-daily at [facility].  Patient verbalized understanding of these instructions.     FOLLOW-UP Return in about 3 weeks (around 11/22/2016) for Follow up INR at [place], [name] Pharm.D., CACP

## 2016-11-02 NOTE — Patient Instructions (Signed)
Patient instructed to take medications as defined in the Anti-coagulation Track section of this encounter.  Patient instructed to take today's dose.  Patient's daughter instructed to provide 1 tablet by mouth once-daily at [facility] on Mondays, Wednesdays and Fridays; all other days--provide  1/2 tablet by mouth once-daily at [facility].  Patient verbalized understanding of these instructions.

## 2016-11-03 ENCOUNTER — Encounter: Payer: Self-pay | Admitting: Podiatry

## 2016-11-03 ENCOUNTER — Ambulatory Visit (INDEPENDENT_AMBULATORY_CARE_PROVIDER_SITE_OTHER): Payer: Medicare HMO | Admitting: Podiatry

## 2016-11-03 DIAGNOSIS — B351 Tinea unguium: Secondary | ICD-10-CM | POA: Diagnosis not present

## 2016-11-03 DIAGNOSIS — M79676 Pain in unspecified toe(s): Secondary | ICD-10-CM

## 2016-11-03 NOTE — Progress Notes (Signed)
Patient ID: [name] [name], male   DOB: 11/11/1930, 83 y.o.   MRN: [id number] Complaint:  Visit Type: Patient returns to my office for continued preventative foot care services. Complaint: Patient states" my nails have grown long and thick and become painful to walk and wear shoes" . The patient presents for preventative foot care services. No changes to ROS  Podiatric Exam: Vascular: dorsalis pedis and posterior tibial pulses are palpable bilateral. Capillary return is immediate. Temperature gradient is WNL. Skin turgor WNL  Sensorium: Normal Semmes Weinstein monofilament test. Normal tactile sensation bilaterally. Nail Exam: Pt has thick disfigured discolored nails with subungual debris noted bilateral entire nail hallux through fifth toenails Ulcer Exam: There is no evidence of ulcer or pre-ulcerative changes or infection. Orthopedic Exam: Muscle tone and strength are WNL. No limitations in general ROM. No crepitus or effusions noted. Foot type and digits show no abnormalities. Bony prominences are unremarkable. Hyperpigmentation due to gout. Skin: No Porokeratosis. No infection or ulcers  Diagnosis:  Onychomycosis, , Pain in right toe, pain in left toes  Treatment & Plan Procedures and Treatment: Consent by patient was obtained for treatment procedures. The patient understood the discussion of treatment and procedures well. All questions were answered thoroughly reviewed. Debridement of mycotic and hypertrophic toenails, 1 through 5 bilateral and clearing of subungual debris. No ulceration, no infection noted.  Return Visit-Office Procedure: Patient instructed to return to the office for a follow up visit 10 weeks  for continued evaluation and treatment.   [name] [name] DPM    Daiquan Resnik DPM 

## 2016-11-05 ENCOUNTER — Ambulatory Visit (INDEPENDENT_AMBULATORY_CARE_PROVIDER_SITE_OTHER): Payer: Medicare HMO | Admitting: *Deleted

## 2016-11-05 ENCOUNTER — Encounter: Payer: Self-pay | Admitting: Internal Medicine

## 2016-11-05 DIAGNOSIS — I639 Cerebral infarction, unspecified: Secondary | ICD-10-CM | POA: Diagnosis not present

## 2016-11-08 NOTE — Progress Notes (Signed)
Carelink Summary Report / Loop Recorder 

## 2016-11-10 ENCOUNTER — Telehealth: Payer: Self-pay | Admitting: Internal Medicine

## 2016-11-10 ENCOUNTER — Other Ambulatory Visit: Payer: Self-pay | Admitting: *Deleted

## 2016-11-10 NOTE — Telephone Encounter (Signed)
APT. REMINDER CALL, LMTCB °

## 2016-11-11 ENCOUNTER — Encounter (INDEPENDENT_AMBULATORY_CARE_PROVIDER_SITE_OTHER): Payer: Self-pay

## 2016-11-11 ENCOUNTER — Encounter: Payer: Self-pay | Admitting: Internal Medicine

## 2016-11-11 ENCOUNTER — Ambulatory Visit (INDEPENDENT_AMBULATORY_CARE_PROVIDER_SITE_OTHER): Payer: Medicare HMO | Admitting: Internal Medicine

## 2016-11-11 VITALS — BP 126/63 | HR 61 | Temp 97.5°F | Ht 69.0 in | Wt 152.4 lb

## 2016-11-11 DIAGNOSIS — Z79899 Other long term (current) drug therapy: Secondary | ICD-10-CM

## 2016-11-11 DIAGNOSIS — Z23 Encounter for immunization: Secondary | ICD-10-CM

## 2016-11-11 DIAGNOSIS — I110 Hypertensive heart disease with heart failure: Secondary | ICD-10-CM | POA: Diagnosis not present

## 2016-11-11 DIAGNOSIS — G20 Parkinson's disease: Secondary | ICD-10-CM | POA: Diagnosis not present

## 2016-11-11 DIAGNOSIS — I10 Essential (primary) hypertension: Secondary | ICD-10-CM

## 2016-11-11 DIAGNOSIS — G219 Secondary parkinsonism, unspecified: Secondary | ICD-10-CM

## 2016-11-11 DIAGNOSIS — E876 Hypokalemia: Secondary | ICD-10-CM

## 2016-11-11 DIAGNOSIS — Z8719 Personal history of other diseases of the digestive system: Secondary | ICD-10-CM

## 2016-11-11 DIAGNOSIS — I2782 Chronic pulmonary embolism: Secondary | ICD-10-CM | POA: Diagnosis not present

## 2016-11-11 DIAGNOSIS — I5022 Chronic systolic (congestive) heart failure: Secondary | ICD-10-CM

## 2016-11-11 DIAGNOSIS — Z7901 Long term (current) use of anticoagulants: Secondary | ICD-10-CM

## 2016-11-11 MED ORDER — WARFARIN SODIUM 5 MG PO TABS: 2.5000 mg | ORAL_TABLET | ORAL | 2 refills | Status: DC

## 2016-11-11 NOTE — Progress Notes (Signed)
[facility] Subjective:  HPI: [name] [name] is a 83 y.o. male who presents for follow up of HTN, PE, sCHF, dementia, hypokalemia. He is accompanied by [name] who provides history.  Please see problem discharging below for the status of his chronic medical problems.     Review of Systems: ROS provided by daughter due to patient's dementia and included in HPI. Objective:  Physical Exam: Vitals:   11/11/16 1105  BP: 126/63  Pulse: 61  Temp: 97.5 F (36.4 C)  TempSrc: Oral  SpO2: 100%  Weight: 152 lb 6.4 oz (69.1 kg)  Height: '5\' 9"'$  (1.753 m)   Physical Exam  Constitutional: He is well-developed, well-nourished, and in no distress.  In wheelchair  HENT:  Mouth/Throat: Oropharynx is clear and moist.  Cardiovascular: Normal rate and regular rhythm.   Pulmonary/Chest: Effort normal and breath sounds normal. No respiratory distress. He has no wheezes. He has no rales.  Abdominal: Soft. Bowel sounds are normal. There is no tenderness.  Musculoskeletal: He exhibits no edema.  Neurological:  4+/5 gross proximal muscle strength on lower extremities, 4+/5 strenght of upper extremities.  Skin: Skin is warm and dry.  Nursing note and vitals reviewed.   Assessment & Plan:  Essential hypertension, benign HPI: No complaints.  A: Essential HTN at goal  P: Continue amlodipine 2.'5mg'$  daily  Chronic pulmonary embolism ([facility]) History of present illness: He has had no bleeding. Daughter reports she is doing well on Coumadin. No new leg swelling or increased shortness of breath.  Assessment: Chronic recurrent pulmonary embolism.  Plan Continue Coumadin  Chronic systolic heart failure ([place]) History of present illness: He has no increased orthopnea, or shortness of breath although is very limited in his activity level.  Assessment: Chronic systolic congestive heart failure  Plan Continue to monitor.    Parkinsonian syndrome  ([place]) History of present illness: Daughter reports that overall he is been doing okay. She does note that he received a Medrol Dosepak from urgent care last month for bronchitis and she thinks that it really helped his Parkinson's features and that he was much more active when taking the steroids to the point that he really had to watch him for falls. She wonders if there could be a beneficial role of steroids and Parkinson's disease.  Assessment Parkinsonian syndrome  Plan We'll plan to continue his Sinemet 25-104 times a day. Told his daughter that I'm not aware that chronic steroids has a role and Parkinson's disease however I'll be happy to look into it and see if there is any research on the issue.  Hypokalemia History of present illness: He is a history of significant hypokalemia which led to a sigmoid volvulus. At that time he has been taking potassium supplementation. His daughter reports that she was instructed to stop the potassium supplementation but that she was concerned in doing so until she wanted to wait to ask me. She has been given him 40 mEq potassium daily.  Assessment: hypokalemia  Plan we'll repeat a be met today as well as a magnesium level.  This is returned with the potassium within normal range therefore think it's reasonable to continue the current dose of 40 mEq daily.   Medications Ordered Meds ordered this encounter  Medications  . warfarin (COUMADIN) 5 MG tablet    Sig: Take 0.5 tablets (2.5 mg total) by mouth as directed.    Dispense:  40 tablet    Refill:  2   Other Orders Orders  Placed This Encounter  Procedures  . Pneumococcal conjugate vaccine 13-valent IM  . BMP8+Anion Gap  . Magnesium   Follow Up: Return in about 3 months (around 02/08/2017).

## 2016-11-11 NOTE — Patient Instructions (Signed)
Keep up the good work. I will call with the results of his labwork.

## 2016-11-12 LAB — BMP8+ANION GAP
Anion Gap: 17 mmol/L (ref 10.0–18.0)
BUN / CREAT RATIO: 11 (ref 10–24)
BUN: 13 mg/dL (ref 8–27)
CHLORIDE: 105 mmol/L (ref 96–106)
CO2: 23 mmol/L (ref 18–29)
Calcium: 9 mg/dL (ref 8.6–10.2)
Creatinine, Ser: 1.19 mg/dL (ref 0.76–1.27)
GFR calc non Af Amer: 56 mL/min/{1.73_m2} — ABNORMAL LOW (ref >59)
GFR, EST AFRICAN AMERICAN: 65 mL/min/{1.73_m2} (ref >59)
GLUCOSE: 83 mg/dL (ref 65–99)
POTASSIUM: 4.1 mmol/L (ref 3.5–5.2)
Sodium: 145 mmol/L — ABNORMAL HIGH (ref 134–144)

## 2016-11-12 LAB — MAGNESIUM: MAGNESIUM: 1.8 mg/dL (ref 1.6–2.3)

## 2016-11-13 LAB — CUP PACEART REMOTE DEVICE CHECK
Implantable Pulse Generator Implant Date: 20160328
MDC IDC SESS DTM: 20180118050900

## 2016-11-15 ENCOUNTER — Emergency Department (HOSPITAL_COMMUNITY)
Admission: EM | Admit: 2016-11-15 | Discharge: 2016-11-16 | Disposition: A | Discharge status: Home / Self Care | Payer: Medicare HMO | Attending: Emergency Medicine | Admitting: Emergency Medicine

## 2016-11-15 ENCOUNTER — Emergency Department (HOSPITAL_COMMUNITY): Payer: Medicare HMO

## 2016-11-15 ENCOUNTER — Other Ambulatory Visit: Payer: Self-pay

## 2016-11-15 ENCOUNTER — Encounter (HOSPITAL_COMMUNITY): Payer: Self-pay

## 2016-11-15 DIAGNOSIS — Z8546 Personal history of malignant neoplasm of prostate: Secondary | ICD-10-CM | POA: Diagnosis not present

## 2016-11-15 DIAGNOSIS — I5022 Chronic systolic (congestive) heart failure: Secondary | ICD-10-CM | POA: Insufficient documentation

## 2016-11-15 DIAGNOSIS — F0280 Dementia in other diseases classified elsewhere without behavioral disturbance: Secondary | ICD-10-CM | POA: Diagnosis not present

## 2016-11-15 DIAGNOSIS — R072 Precordial pain: Secondary | ICD-10-CM | POA: Diagnosis present

## 2016-11-15 DIAGNOSIS — I2510 Atherosclerotic heart disease of native coronary artery without angina pectoris: Secondary | ICD-10-CM | POA: Insufficient documentation

## 2016-11-15 DIAGNOSIS — R05 Cough: Secondary | ICD-10-CM | POA: Insufficient documentation

## 2016-11-15 DIAGNOSIS — R0789 Other chest pain: Secondary | ICD-10-CM | POA: Insufficient documentation

## 2016-11-15 DIAGNOSIS — G309 Alzheimer's disease, unspecified: Secondary | ICD-10-CM | POA: Insufficient documentation

## 2016-11-15 DIAGNOSIS — Z7982 Long term (current) use of aspirin: Secondary | ICD-10-CM | POA: Diagnosis not present

## 2016-11-15 DIAGNOSIS — I110 Hypertensive heart disease with heart failure: Secondary | ICD-10-CM | POA: Diagnosis not present

## 2016-11-15 DIAGNOSIS — R079 Chest pain, unspecified: Secondary | ICD-10-CM

## 2016-11-15 DIAGNOSIS — R059 Cough, unspecified: Secondary | ICD-10-CM

## 2016-11-15 LAB — CBC
HEMATOCRIT: 40.1 % (ref 39.0–52.0)
Hemoglobin: 12.9 g/dL — ABNORMAL LOW (ref 13.0–17.0)
MCH: 29.7 pg (ref 26.0–34.0)
MCHC: 32.2 g/dL (ref 30.0–36.0)
MCV: 92.2 fL (ref 78.0–100.0)
Platelets: 162 10*3/uL (ref 150–400)
RBC: 4.35 MIL/uL (ref 4.22–5.81)
RDW: 16.4 % — AB (ref 11.5–15.5)
WBC: 3 10*3/uL — AB (ref 4.0–10.5)

## 2016-11-15 LAB — MAGNESIUM: Magnesium: 1.7 mg/dL (ref 1.7–2.4)

## 2016-11-15 LAB — I-STAT TROPONIN, ED
TROPONIN I, POC: 0 ng/mL (ref 0.00–0.08)
Troponin i, poc: 0 ng/mL (ref 0.00–0.08)

## 2016-11-15 LAB — BASIC METABOLIC PANEL
Anion gap: 10 (ref 5–15)
BUN: 10 mg/dL (ref 6–20)
CHLORIDE: 108 mmol/L (ref 101–111)
CO2: 21 mmol/L — AB (ref 22–32)
CREATININE: 0.86 mg/dL (ref 0.61–1.24)
Calcium: 8.9 mg/dL (ref 8.9–10.3)
GFR calc Af Amer: >60 mL/min (ref >60)
GFR calc non Af Amer: >60 mL/min (ref >60)
GLUCOSE: 97 mg/dL (ref 65–99)
POTASSIUM: 3.9 mmol/L (ref 3.5–5.1)
Sodium: 139 mmol/L (ref 135–145)

## 2016-11-15 LAB — PROTIME-INR
INR: 2.2
PROTHROMBIN TIME: 24.8 s — AB (ref 11.4–15.2)

## 2016-11-15 NOTE — ED Triage Notes (Signed)
Pt from home with gems c/o substernal chest pressure. Pt unsure when the pain started, family came home around 4pm when pt told them about the pain. Pt has hx of parkinsons and dementia. Pt alert and oriented to person and place. Pt received 324 ASA en route, no Nitro. Pt sinus rythym with PVCs on the monitor. VSS. NAD at this time

## 2016-11-15 NOTE — ED Notes (Signed)
[name] [name] (Daughter) [phone]

## 2016-11-15 NOTE — ED Notes (Signed)
Lab contacted magnesium

## 2016-11-15 NOTE — ED Provider Notes (Signed)
[facility] DEPT Provider Note   CSN: [id number] Arrival date & time: 11/15/16  1859     History   Chief Complaint Chief Complaint  Patient presents with  . Chest Pain    HPI [name] [name] is a 83 y.o. male     The history is provided by the patient and the spouse. The history is limited by the condition of the patient.  Chest Pain   This is a recurrent problem. The current episode started 3 to 5 hours ago (lasted for 2 hours). The problem occurs rarely. The problem has been resolved. The pain is associated with exertion. The pain is present in the substernal region. The pain is moderate. The quality of the pain is described as exertional. The pain does not radiate. Duration of episode(s) is 2 hours. The symptoms are aggravated by exertion. Associated symptoms include cough (dry) and shortness of breath. Pertinent negatives include no abdominal pain, no back pain, no diaphoresis, no dizziness, no exertional chest pressure, no fever, no headaches, no lower extremity edema, no nausea, no near-syncope, no numbness, no palpitations, no syncope, no vomiting and no weakness. He has tried rest for the symptoms. The treatment provided mild relief. Risk factors include male gender.  His past medical history is significant for CAD and PE.    Past Medical History:  Diagnosis Date  . Anxiety   . Coronary artery disease, non-occlusive    with history of MI; Cath 2008 w multivessel nonobstructive CAD  . Dementia   . Depression   . GERD (gastroesophageal reflux disease)   . Hyperlipidemia   . Hypertension   . PE (pulmonary embolism)    unprovoked PE completed 6 months of warfarin, warfarin d/ced 02/12/2010, repeat PE 07/10/2010 after a long car ride and now on lifelong coumadin.  . Pituitary microadenoma ([place])    incidental finding CT 12/2009  . Prostate cancer ([place])   . Scoliosis     Patient Active Problem List   Diagnosis Date Noted  . Loss of weight 09/23/2016  . Chest mass  08/31/2016  . Abdominal distension   . Hypomagnesemia   . Urinary tract infection without hematuria   . Chronic anticoagulation   . Right bundle branch block   . Sigmoid volvulus ([place]) 08/02/2016  . Hypokalemia 08/02/2016  . Hypernatremia 07/15/2016  . Diverticulosis 07/15/2016  . Pyelonephritis 07/09/2016  . Unspecified abnormalities of breathing 07/09/2016  . Delirium   . Statin myopathy 03/15/2016  . Ileus ([place]) 09/15/2015  . Diarrhea   . Vision changes 08/11/2015  . Parkinsonian syndrome ([place]) 08/11/2015  . Finger injury 07/28/2015  . Leukopenia 04/16/2015  . Normocytic anemia, not due to blood loss 04/16/2015  . Chronic systolic heart failure ([place]) 04/16/2015  . Falls frequently 04/16/2015  . Onychomycosis 04/15/2015  . CAD (coronary artery disease) 01/16/2015  . Faintness   . 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 Syncope 12/07/2014  . Healthcare maintenance 10/10/2014  . Somnolence, daytime 10/01/2014  . Acute pain of right hip 02/13/2014  . Lumbosacral spondylosis without myelopathy 09/24/2013  . Bradycardia 02/20/2013  . Generalized ischemic cerebrovascular disease 02/05/2013  . Depression 01/25/2013  . Penile bleeding 01/19/2013  . Constipation 01/20/2012  . Insomnia 01/20/2012  . AKI (acute kidney injury) ([place]) 11/08/2011  . Alzheimer's dementia 08/17/2011  . Pulmonary embolism ([place]) 09/18/2010  . Encounter for long-term use of antiplatelets/antithrombotics 09/18/2010  . DJD (degenerative joint disease) 08/06/2010  . ADENOCARCINOMA, PROSTATE, HX OF 08/06/2010  . HLD (hyperlipidemia) 10/15/2009  . Essential  hypertension, benign 10/15/2009  . GERD 10/15/2009    Past Surgical History:  Procedure Laterality Date  . FLEXIBLE SIGMOIDOSCOPY N/A 08/02/2016   Procedure: FLEXIBLE SIGMOIDOSCOPY;  Surgeon: Ronald Lobo, MD;  Location: [facility] ENDOSCOPY;  Service: Endoscopy;  Laterality: N/A;  . FLEXIBLE SIGMOIDOSCOPY N/A 08/06/2016   Procedure: FLEXIBLE SIGMOIDOSCOPY;   Surgeon: Ronald Lobo, MD;  Location: [facility] ENDOSCOPY;  Service: Endoscopy;  Laterality: N/A;  . LEFT HEART CATHETERIZATION WITH CORONARY ANGIOGRAM N/A 12/10/2014   Procedure: LEFT HEART CATHETERIZATION WITH CORONARY ANGIOGRAM;  Surgeon: [name], MD;  Location: [facility] CATH LAB;  Service: Cardiovascular;  Laterality: N/A;  . LOOP RECORDER IMPLANT N/A 12/16/2014   Procedure: LOOP RECORDER IMPLANT;  Surgeon: [name], MD;  Location: [facility] CATH LAB;  Service: Cardiovascular;  Laterality: N/A;  . PROSTATECTOMY         Home Medications    Prior to Admission medications   Medication Sig Start Date End Date Taking? Authorizing Provider  acetaminophen (TYLENOL) 500 MG tablet Take 500 mg by mouth every 4 (four) hours as needed for mild pain or headache.    Historical Provider, MD  amLODipine (NORVASC) 2.5 MG tablet TAKE 1 TABLET (2.5 MG TOTAL) BY MOUTH DAILY. 08/24/16   [name], MD  aspirin 81 MG chewable tablet Chew 81 mg by mouth daily.    Historical Provider, MD  b complex vitamins tablet Take 1 tablet by mouth every morning.     Historical Provider, MD  Ca Phosphate-Cholecalciferol (CALCIUM/VITAMIN D3 GUMMIES) 250-350 MG-UNIT CHEW Chew 1 Dose by mouth 2 (two) times daily. 07/08/16   [name], DO  carbidopa-levodopa (SINEMET IR) 25-100 MG tablet Take 1 tablet by mouth 4 (four) times daily. 10/20/16   [name], DO  docusate sodium (COLACE) 100 MG capsule Take 1 capsule (100 mg total) by mouth daily. 09/23/16 09/23/17  Maryellen Pile, MD  feeding supplement, ENSURE ENLIVE, (ENSURE ENLIVE) LIQD Take 237 mLs by mouth 3 (three) times daily between meals. 08/08/16   Alphonzo Grieve, MD  fish oil-omega-3 fatty acids 1000 MG capsule Take 2 g by mouth every morning.     Historical Provider, MD  memantine (NAMENDA XR) 28 MG CP24 24 hr capsule Take 1 capsule (28 mg total) by mouth every morning. 09/23/16   Garvin Fila, MD  methylPREDNISolone (MEDROL DOSEPAK) 4 MG TBPK tablet Take 6-5-4-3-2-1  po qd 10/05/16   Lysbeth Penner, FNP  Multiple Vitamins-Minerals (MULTIVITAMIN WITH MINERALS) tablet Take 1 tablet by mouth every morning.     Historical Provider, MD  polyethylene glycol (MIRALAX / GLYCOLAX) packet Take 17 g by mouth daily as needed. Patient taking differently: Take 17 g by mouth daily as needed for mild constipation.  07/13/16   Velna Ochs, MD  potassium chloride SA (KLOR-CON M20) 20 MEQ tablet Take 2 tablets (40 mEq total) by mouth 2 (two) times daily. IM program Patient taking differently: Take 40 mEq by mouth daily. IM program 08/10/16   [name], DO  QUEtiapine (SEROQUEL) 25 MG tablet Take 1 tablet (25 mg total) by mouth at bedtime. 06/21/16   Garvin Fila, MD  senna (SENOKOT) 8.6 MG TABS tablet Take 1 tablet (8.6 mg total) by mouth daily as needed for mild constipation. 07/13/16   Velna Ochs, MD  vitamin C (ASCORBIC ACID) 500 MG tablet Take 1,000 mg by mouth every morning.     Historical Provider, MD  warfarin (COUMADIN) 5 MG tablet Take 0.5 tablets (2.5 mg total)  by mouth as directed. 11/11/16   [name], DO    Family History Family History  Problem Relation Age of Onset  . Hypertension Father     Passed away from cerebral hemorrhage at age of 64.  [name] Dementia Mother     Passed away  . Hypertension Child     4 adult children  . Cervical cancer Other   . Lung cancer Other   . Stroke Other   . Heart attack Neg Hx     Social History Social History  Substance Use Topics  . Smoking status: Never Smoker  . Smokeless tobacco: Never Used  . Alcohol use No     Allergies   Aricept [donepezil hcl]; Other; and Shellfish-derived products   Review of Systems Review of Systems  Constitutional: Negative for activity change, appetite change, chills, diaphoresis, fatigue and fever.  HENT: Negative for congestion and rhinorrhea.   Eyes: Negative for visual disturbance.  Respiratory: Positive for cough (dry) and shortness of breath. Negative  for choking, chest tightness, wheezing and stridor.   Cardiovascular: Positive for chest pain. Negative for palpitations, leg swelling, syncope and near-syncope.  Gastrointestinal: Negative for abdominal distention, abdominal pain, blood in stool, constipation, diarrhea, nausea and vomiting.  Genitourinary: Negative for difficulty urinating, dysuria and flank pain.  Musculoskeletal: Negative for back pain and gait problem.  Skin: Negative for rash and wound.  Neurological: Negative for dizziness, weakness, light-headedness, numbness and headaches.  Psychiatric/Behavioral: Negative for agitation.  All other systems reviewed and are negative.    Physical Exam Updated Vital Signs BP 141/98   Pulse 74   Temp 98.9 F (37.2 C) (Oral)   Resp 15   Ht 5\' 9"  (1.753 m)   Wt 152 lb (68.9 kg)   SpO2 100%   BMI 22.45 kg/m   Physical Exam  Constitutional: He is oriented to person, place, and time. He appears well-developed and well-nourished. No distress.  HENT:  Head: Normocephalic and atraumatic.  Right Ear: External ear normal.  Left Ear: External ear normal.  Nose: Nose normal.  Mouth/Throat: Oropharynx is clear and moist. No oropharyngeal exudate.  Eyes: Conjunctivae and EOM are normal. Pupils are equal, round, and reactive to light.  Neck: Normal range of motion. Neck supple.  Cardiovascular: Normal rate, regular rhythm, normal heart sounds and intact distal pulses.   No murmur heard. Pulmonary/Chest: Effort normal and breath sounds normal. No stridor. No respiratory distress. He has no wheezes. He has no rales. He exhibits no tenderness.  Abdominal: Soft. There is no tenderness. There is no rebound and no guarding.  Musculoskeletal: He exhibits no edema.  Neurological: He is alert and oriented to person, place, and time. He displays normal reflexes. No cranial nerve deficit. He exhibits normal muscle tone. Coordination normal.  Skin: Skin is warm. Capillary refill takes less than 2  seconds. No rash noted. He is not diaphoretic. No erythema.  Nursing note and vitals reviewed.    ED Treatments / Results  Labs (all labs ordered are listed, but only abnormal results are displayed) Labs Reviewed  BASIC METABOLIC PANEL - Abnormal; Notable for the following:       Result Value   CO2 21 (*)    All other components within normal limits  CBC - Abnormal; Notable for the following:    WBC 3.0 (*)    Hemoglobin 12.9 (*)    RDW 16.4 (*)    All other components within normal limits  PROTIME-INR - Abnormal; Notable for  the following:    Prothrombin Time 24.8 (*)    All other components within normal limits  MAGNESIUM  I-STAT TROPOININ, ED  I-STAT TROPOININ, ED    EKG  EKG Interpretation None       Radiology Dg Chest 2 View  Result Date: 11/15/2016 CLINICAL DATA:  Chest pain, central and right side. EXAM: CHEST  2 VIEW COMPARISON:  Chest x-ray dated 08/01/2016. FINDINGS: Study is again hypoinspiratory with crowding of the perihilar and bibasilar bronchovascular markings. Given the low lung volumes, lungs appear clear. No evidence of pneumonia. No pleural effusion or pneumothorax seen. Heart size and mediastinal contours appear stable. Atherosclerotic changes noted at the aortic arch. Upper abdominal large bowel is again noted to be distended with gas, similar to multiple prior exams (institutional bowel?). IMPRESSION: Low lung volumes. No active cardiopulmonary disease. No evidence of pneumonia or pulmonary edema. Institutional bowel syndrome? Aortic atherosclerosis. Electronically Signed   By: Franki Cabot M.D.   On: 11/15/2016 20:14    Procedures Procedures (including critical care time)  Medications Ordered in ED Medications - No data to display PT received ASA with EMS   Initial Impression / Assessment and Plan / ED Course  I have reviewed the triage vital signs and the nursing notes.  Pertinent labs & imaging results that were available during my care of  the patient were reviewed by me and considered in my medical decision making (see chart for details).     [name] [name] is a 83 y.o. male With a past medical history significant for CAD, hypertension, pulmonary embolism on Coumadin therapy, and dementia who presents with a period of chest pain this afternoon and dry cough. Patient is accompanied by family reports that this afternoon after walking from a store, patient had chest pain lasting approximately two hours. He described it as a sharpness in his central chest that did not radiate. It was associated with some shortness of breath. Patient has a history of rib fractures on the right but it was not in the same location of the pain. Patient reports that rest made it better and his symptoms resolved after a period of time. Patient denies syncope, lightheadedness, palpitations, or continued pain. Patient has had no pain since arrival. Patient received aspirin on the way to the ED.  Patient also reports that he has spent time over the last two days with a grandchild who is diagnosed with influenza. Patient has developed a cough today.  Patient otherwise is had no symptoms including no diaphoresis, nausea, vomiting, or other troubles.  On exam, patient appears well. Patient is alert and oriented. Patient has symmetric pulses, clear lungs, and chest is nontender. Abdomen nontender. No focal neurologic deficits.  EKG appeared unchanged from prior. Chest x-ray shows no pneumonia. Laboratory testing grossly unremarkable. Troponin negative times two.  Heart score not calculated because patient is high risk with his history of CAD. Shared decision-making conversation held with family and admission was offered due to chest pain, shortness of breath, and lightheadedness that was related to exertion and his history of CAD however,  given patients well appearance in the ED, they are requesting discharge. Family informed that patient's risk of major adverse  cardiac event in the next 30 days is not less than 1% due to his history however, they are a very knowledgeable family and appear capable of observing strict return precautions as well as speaking with his PCP and cardiologist tomorrow.  Patient will be discharged with plans to follow up  as well as return if symptoms worsen. Given the new cough in the setting of known influenza exposure, patient will be prescribed Tamiflu. Patient family agreed with plan of care and had no other questions or concerns. Patient was discharged in good condition with no further complaints.     Final Clinical Impressions(s) / ED Diagnoses   Final diagnoses:  Chest pain, unspecified type  Cough    New Prescriptions Discharge Medication List as of 11/16/2016 12:13 AM    START taking these medications   Details  oseltamivir (TAMIFLU) 75 MG capsule Take 1 capsule (75 mg total) by mouth every 12 (twelve) hours., Starting Tue 11/16/2016, Until Sun 11/21/2016, Print        Clinical Impression: 1. Chest pain, unspecified type   2. Cough     Disposition: Discharge  Condition: Good  I have discussed the results, Dx and Tx plan with the pt(& family if present). He/she/they expressed understanding and agree(s) with the plan. Discharge instructions discussed at great length. Strict return precautions discussed and pt &/or family have verbalized understanding of the instructions. No further questions at time of discharge.    Discharge Medication List as of 11/16/2016 12:13 AM    START taking these medications   Details  oseltamivir (TAMIFLU) 75 MG capsule Take 1 capsule (75 mg total) by mouth every 12 (twelve) hours., Starting Tue 11/16/2016, Until Sun 11/21/2016, Print        Follow Up: [name], DO [place] [id number] [place] [address] [id number] [place] [phone]  If  symptoms worsen     Courtney Paris, MD 11/16/16 1227

## 2016-11-16 MED ORDER — OSELTAMIVIR PHOSPHATE 75 MG PO CAPS: 75.0000 mg | ORAL_CAPSULE | Freq: Two times a day (BID) | ORAL | 0 refills | Status: AC

## 2016-11-16 NOTE — Discharge Instructions (Signed)
Please call his cardiologist and PCP tomorrow for further management and reevaluation in the next several days. If symptoms return or worsen, please return to the nearest emergency department.

## 2016-11-17 NOTE — Assessment & Plan Note (Signed)
History of present illness: He is a history of significant hypokalemia which led to a sigmoid volvulus. At that time he has been taking potassium supplementation. His daughter reports that she was instructed to stop the potassium supplementation but that she was concerned in doing so until she wanted to wait to ask me. She has been given him 40 mEq potassium daily.  Assessment: hypokalemia  Plan we'll repeat a be met today as well as a magnesium level.  This is returned with the potassium within normal range therefore think it's reasonable to continue the current dose of 40 mEq daily.

## 2016-11-17 NOTE — Assessment & Plan Note (Signed)
History of present illness: Daughter reports that overall he is been doing okay. She does note that he received a Medrol Dosepak from urgent care last month for bronchitis and she thinks that it really helped his Parkinson's features and that he was much more active when taking the steroids to the point that he really had to watch him for falls. She wonders if there could be a beneficial role of steroids and Parkinson's disease.  Assessment Parkinsonian syndrome  Plan We'll plan to continue his Sinemet 25-104 times a day. Told his daughter that I'm not aware that chronic steroids has a role and Parkinson's disease however I'll be happy to look into it and see if there is any research on the issue.

## 2016-11-17 NOTE — Assessment & Plan Note (Signed)
History of present illness: He has no increased orthopnea, or shortness of breath although is very limited in his activity level.  Assessment: Chronic systolic congestive heart failure  Plan Continue to monitor.

## 2016-11-17 NOTE — Assessment & Plan Note (Signed)
History of present illness: He has had no bleeding. Daughter reports she is doing well on Coumadin. No new leg swelling or increased shortness of breath.  Assessment: Chronic recurrent pulmonary embolism.  Plan Continue Coumadin

## 2016-11-17 NOTE — Assessment & Plan Note (Addendum)
HPI: No complaints.  A: Essential HTN at goal  P: Continue amlodipine 2.5mg  daily

## 2016-11-22 ENCOUNTER — Ambulatory Visit (INDEPENDENT_AMBULATORY_CARE_PROVIDER_SITE_OTHER): Payer: Medicare HMO

## 2016-11-22 DIAGNOSIS — I2782 Chronic pulmonary embolism: Secondary | ICD-10-CM | POA: Diagnosis not present

## 2016-11-22 DIAGNOSIS — Z7902 Long term (current) use of antithrombotics/antiplatelets: Secondary | ICD-10-CM

## 2016-11-22 DIAGNOSIS — Z7901 Long term (current) use of anticoagulants: Secondary | ICD-10-CM

## 2016-11-22 LAB — POCT INR: INR: 2

## 2016-11-22 MED ORDER — WARFARIN SODIUM 5 MG PO TABS: ORAL_TABLET | ORAL | 2 refills | Status: DC

## 2016-11-22 NOTE — Patient Instructions (Signed)
Patient instructed to take medications as defined in the Anti-coagulation Track section of this encounter.  Patient instructed to take today's dose.  Patient instructed to take one (1) tablet by mouth on Mondays, Tuesdays, Wednesdays and Fridays; on all other days--give only 1/2 tablet.  Patient verbalized understanding of these instructions.

## 2016-11-22 NOTE — Progress Notes (Signed)
Anti-Coagulation Progress Note  [name] [name] is a 83 y.o. male who is currently on an anti-coagulation regimen.    RECENT RESULTS: Recent results are below, the most recent result is correlated with a dose of 25 mg. per week: Lab Results  Component Value Date   INR 2.0 11/22/2016   INR 2.20 11/15/2016   INR 2.10 11/01/2016    ANTI-COAG DOSE: Anticoagulation Dose Instructions as of 11/22/2016      Sun Mon Tue Wed Thu Fri Sat   New Dose 2.5 mg 5 mg 5 mg 5 mg 2.5 mg 5 mg 2.5 mg    Description   Give one (1) tablet by mouth on Mondays, Tuesdays, Wednesdays and Fridays; on all other days--give only 1/2 tablet.       ANTICOAG SUMMARY: Anticoagulation Episode Summary    Current INR goal:   2.0-3.0  TTR:   71.0 % (5.9 y)  Next INR check:   12/13/2016  INR from last check:   2.0 (11/22/2016)  Weekly max dose:     Target end date:   Indefinite  [facility] check [facility]   [facility]  Preferred lab:     Send INR reminders to:   ANTICOAG IMP   Indications   Chronic pulmonary embolism ([place]) [I27.82] Encounter for long-term use of antiplatelets/antithrombotics [Z79.02]       Comments:   History of multiple venous embolic episodes. Will continue to annual re-evaluate continued need for warfarin weighing risks vs. benefits.       Anticoagulation Care Providers    Provider Role Specialty Phone number   Bertha Stakes, MD  Internal Medicine [phone]      ANTICOAG TODAY: Anticoagulation Summary  As of 11/22/2016   INR goal:   2.0-3.0  TTR:     Today's INR:   2.0  Next INR check:   12/13/2016  Target end date:   Indefinite   Indications   Chronic pulmonary embolism ([place]) [I27.82] Encounter for long-term use of antiplatelets/antithrombotics [Z79.02]        Anticoagulation Episode Summary    [facility] check location[facility]   [facility]   Preferred lab:      Send INR reminders to:   ANTICOAG IMP   Comments:   History of multiple venous embolic episodes. Will continue to annual  re-evaluate continued need for warfarin weighing risks vs. benefits.     Anticoagulation Care Providers    Provider Role Specialty Phone number   Bertha Stakes, MD  Internal Medicine [phone]      PATIENT INSTRUCTIONS: Patient Instructions  Patient instructed to take medications as defined in the Anti-coagulation Track section of this encounter.  Patient instructed to take today's dose.  Patient instructed to take one (1) tablet by mouth on Mondays, Tuesdays, Wednesdays and Fridays; on all other days--give only 1/2 tablet.  Patient verbalized understanding of these instructions.      FOLLOW-UP Return in 3 weeks (on 12/13/2016) for Follow up INR @ 2:45pm.  Dierdre Harness, BS, PharmD Clinical Pharmacy Resident [phone] (Pager) 11/22/2016 3:47 PM

## 2016-11-24 LAB — CUP PACEART REMOTE DEVICE CHECK
MDC IDC PG IMPLANT DT: 20160328
MDC IDC SESS DTM: 20180217050857

## 2016-11-25 NOTE — Progress Notes (Signed)
INTERNAL MEDICINE TEACHING ATTENDING ADDENDUM - Aldine Contes M.D  Duration- indefninite, Indication- recurrent PEs, INR- therapeutic. Agree with pharmacy recommendations as outlined in their note.

## 2016-11-30 ENCOUNTER — Encounter: Payer: Self-pay | Admitting: Internal Medicine

## 2016-11-30 ENCOUNTER — Ambulatory Visit (INDEPENDENT_AMBULATORY_CARE_PROVIDER_SITE_OTHER): Payer: Medicare HMO | Admitting: Internal Medicine

## 2016-11-30 VITALS — BP 152/90 | HR 67 | Ht 69.0 in | Wt 148.6 lb

## 2016-11-30 DIAGNOSIS — R001 Bradycardia, unspecified: Secondary | ICD-10-CM | POA: Diagnosis not present

## 2016-11-30 DIAGNOSIS — R55 Syncope and collapse: Secondary | ICD-10-CM | POA: Diagnosis not present

## 2016-11-30 NOTE — Progress Notes (Signed)
Electrophysiology Office Note   Date:  11/30/2016   ID:  [name] [name], DOB 04-29-1931, MRN [id number]  PCP:  [name], DO  Cardiologist:   Primary Electrophysiologist:   [name], MD    No chief complaint on file.    History of Present Illness: [name] [name] is a 83 y.o. male     seen in follow-up for syncope and some degrees of bradycardia. After lengthy discussions, not withstanding underlying conduction system disease and identification frequent PVCs as potential explanation for his "bradycardia", it was elected to put in an event recorder.   Today, he denies  symptoms of  chest pain, shortness of breath, orthopnea, PND, lower extremity edema, bleeding, or neurologic sequela.  he has progressive Parkinson's. He ambulates a little if at all   The patient is tolerating medications without difficulties and is otherwise without complaint today.    Past Medical History:  Diagnosis Date  . Anxiety   . Coronary artery disease, non-occlusive    with history of MI; Cath 2008 w multivessel nonobstructive CAD  . Dementia   . Depression   . GERD (gastroesophageal reflux disease)   . Hyperlipidemia   . Hypertension   . PE (pulmonary embolism)    unprovoked PE completed 6 months of warfarin, warfarin d/ced 02/12/2010, repeat PE 07/10/2010 after a long car ride and now on lifelong coumadin.  . Pituitary microadenoma ([place])    incidental finding CT 12/2009  . Prostate cancer ([place])   . Scoliosis    Past Surgical History:  Procedure Laterality Date  . FLEXIBLE SIGMOIDOSCOPY N/A 08/02/2016   Procedure: FLEXIBLE SIGMOIDOSCOPY;  Surgeon: Ronald Lobo, MD;  Location: [facility] ENDOSCOPY;  Service: Endoscopy;  Laterality: N/A;  . FLEXIBLE SIGMOIDOSCOPY N/A 08/06/2016   Procedure: FLEXIBLE SIGMOIDOSCOPY;  Surgeon: Ronald Lobo, MD;  Location: [facility] ENDOSCOPY;  Service: Endoscopy;  Laterality: N/A;  . LEFT HEART CATHETERIZATION WITH CORONARY ANGIOGRAM N/A 12/10/2014   Procedure: LEFT  HEART CATHETERIZATION WITH CORONARY ANGIOGRAM;  Surgeon: [name], MD;  Location: [facility] CATH LAB;  Service: Cardiovascular;  Laterality: N/A;  . LOOP RECORDER IMPLANT N/A 12/16/2014   Procedure: LOOP RECORDER IMPLANT;  Surgeon: [name], MD;  Location: [facility] CATH LAB;  Service: Cardiovascular;  Laterality: N/A;  . PROSTATECTOMY       Current Outpatient Prescriptions  Medication Sig Dispense Refill  . acetaminophen (TYLENOL) 500 MG tablet Take 500 mg by mouth every 4 (four) hours as needed for mild pain or headache.    [name] amLODipine (NORVASC) 2.5 MG tablet TAKE 1 TABLET (2.5 MG TOTAL) BY MOUTH DAILY. 90 tablet 0  . aspirin 81 MG chewable tablet Chew 81 mg by mouth daily.    [name] b complex vitamins tablet Take 1 tablet by mouth every morning.     . Ca Phosphate-Cholecalciferol (CALCIUM/VITAMIN D3 GUMMIES) 250-350 MG-UNIT CHEW Chew 1 Dose by mouth 2 (two) times daily. 180 tablet 3  . carbidopa-levodopa (SINEMET IR) 25-100 MG tablet Take 1 tablet by mouth 4 (four) times daily. 120 tablet 2  . docusate sodium (COLACE) 100 MG capsule Take 1 capsule (100 mg total) by mouth daily. 30 capsule 1  . feeding supplement, ENSURE ENLIVE, (ENSURE ENLIVE) LIQD Take 237 mLs by mouth 3 (three) times daily between meals. 45 Bottle 12  . fish oil-omega-3 fatty acids 1000 MG capsule Take 2 g by mouth every morning.     . memantine (NAMENDA XR) 28 MG CP24 24 hr capsule Take 1 capsule (28 mg total)  by mouth every morning. 30 capsule 6  . Multiple Vitamins-Minerals (MULTIVITAMIN WITH MINERALS) tablet Take 1 tablet by mouth every morning.     . polyethylene glycol (MIRALAX / GLYCOLAX) packet Take 17 g by mouth daily as needed. (Patient taking differently: Take 17 g by mouth daily as needed for mild constipation. ) 14 each 1  . potassium chloride SA (KLOR-CON M20) 20 MEQ tablet Take 2 tablets (40 mEq total) by mouth 2 (two) times daily. IM program (Patient taking differently: Take 40 mEq by mouth daily. IM program) 120  tablet 2  . QUEtiapine (SEROQUEL) 25 MG tablet Take 1 tablet (25 mg total) by mouth at bedtime. 90 tablet 4  . senna (SENOKOT) 8.6 MG TABS tablet Take 1 tablet (8.6 mg total) by mouth daily as needed for mild constipation. 120 each 0  . vitamin C (ASCORBIC ACID) 500 MG tablet Take 1,000 mg by mouth every morning.     . warfarin (COUMADIN) 5 MG tablet Take 1 tablet by mouth on Monday, Tuesday, Wednesday, and Friday; take 1/2 tablet on all other days 24 tablet 2   No current facility-administered medications for this visit.     Allergies:   Aricept [donepezil hcl]; Other; and Shellfish-derived products   Social History:  The patient  reports that he has never smoked. He has never used smokeless tobacco. He reports that he does not drink alcohol or use drugs.   Family History:  The patient's    family history includes Cervical cancer in his other; Dementia in his mother; Hypertension in his child and father; Lung cancer in his other; Stroke in his other.    ROS:  Please see the history of present illness.   Otherwise, review of systems is negative *.    PHYSICAL EXAM: VS:  BP (!) 152/90   Pulse 67   Ht 5\' 9"  (1.753 m)   Wt 148 lb 9.6 oz (67.4 kg)   SpO2 98%   BMI 21.94 kg/m  , BMI Body mass index is 21.94 kg/m. GEN: Well nourished, well developed, in no acute distress  HEENT: normal  Cardiac:  R RR; 2/6  murmur  rubs,    Respiratory:  clear to auscultation bilaterally, normal work of breathing Back without kyphosis or CVAT GI: soft, nontender, nondistended, + BS MS: no deformity or atrophy  Skin: warm and dry,   Extremities No Clubbing cyanosis  Edema Neuro:  Strength and sensation are intact Psych: euthymic mood, full affect  Alert but not oriented         Recent Labs: 08/02/2016: ALT 6 11/15/2016: BUN 10; Creatinine, Ser 0.86; Hemoglobin 12.9; Magnesium 1.7; Platelets 162; Potassium 3.9; Sodium 139    Lipid Panel     Component Value Date/Time   CHOL 183 07/08/2016  1035   TRIG 95 07/08/2016 1035   HDL 82 07/08/2016 1035   CHOLHDL 2.2 07/08/2016 1035   CHOLHDL 3.7 06/30/2012 1153   VLDL 26 06/30/2012 1153   LDLCALC 82 07/08/2016 1035     Wt Readings from Last 3 Encounters:  11/30/16 148 lb 9.6 oz (67.4 kg)  11/15/16 152 lb (68.9 kg)  11/11/16 152 lb 6.4 oz (69.1 kg)      Other studies Reviewed: Additional studies/ records that were reviewed today include LHC 12/10/14 Left main: Large caliber normal long vessel which bifurcated into an LAD and left circumflex vessel.  LAD: Diagonal prox 40-50% (small). LAD after Dx 50%; mid LAD with significant mid systolic bridging with narrowing  up to 80% during systole that improved with IC nitroglycerin and with diastole 20% narrowed. Left circumflex: Small tortuous vessel which gave rise to 2 OM branches.  Right coronary artery: Large dominant normal vessel that supplied the PDA and PLA vessel. Left ventriculography: EF 50-55%. There was ectopy without definitive wall motion abnormalities.  Echo 12/09/14 - EF 40% to 45%. [facility] 12/10/14 Left main: Large caliber normal long vessel which bifurcated into an LAD and left circumflex vessel.  LAD: Diagonal prox 40-50% (small). LAD after Dx 50%; mid LAD with significant mid systolic bridging with narrowing up to 80% during systole that improved with IC nitroglycerin and with diastole 20% narrowed. Left circumflex: Small tortuous vessel which gave rise to 2 OM branches.  Right coronary artery: Large dominant normal vessel that supplied the PDA and PLA vessel. Left ventriculography: EF 50-55%. There was ectopy without definitive wall motion abnormalities.      ASSESSMENT AND PLAN: 1. Syncope with symptomatic bradycardia   2. CAD - nonobstructive    3. Recurrent PE   4. HTN  5. Parkinson's  6. Orthostatic lightheadedness  7.  LINQ   Stable  Without symptoms of ischemia  nothing to do from our point of view await recurrent  syncope  He should probably NOT be on ASA will defer to Dr Lesly Dukes  Have advised his daughter to discuss with PCP re palliative care.    Current medicines are reviewed at length with the patient today.   The patient does not have concerns regarding his medicines.  The following changes were made today:  none  Labs/ tests ordered today include:    No orders of the defined types were placed in this encounter.    Disposition:   FU with me  12 months   Signed, [name], MD  11/30/2016 3:12 PM     [place] [address] [place] [address] [phone] (office) [phone] (fax)

## 2016-11-30 NOTE — Patient Instructions (Signed)
Medication Instructions: - Your physician recommends that you continue on your current medications as directed. Please refer to the Current Medication list given to you today.  Labwork: - none ordered  Procedures/Testing: - none ordered  Follow-Up: - Your physician wants you to follow-up in: 6 months with Dr. Meda Coffee & 1 year with Dr. [name]. You will receive a reminder letter in the mail two months in advance. If you don't receive a letter, please call our office to schedule the follow-up appointment.   Any Additional Special Instructions Will Be Listed Below (If Applicable).     If you need a refill on your cardiac medications before your next appointment, please call your pharmacy.

## 2016-12-01 LAB — CUP PACEART INCLINIC DEVICE CHECK
Implantable Pulse Generator Implant Date: 20160328
MDC IDC SESS DTM: 20180313192848

## 2016-12-06 ENCOUNTER — Ambulatory Visit (INDEPENDENT_AMBULATORY_CARE_PROVIDER_SITE_OTHER): Payer: Medicare HMO | Admitting: *Deleted

## 2016-12-06 DIAGNOSIS — R55 Syncope and collapse: Secondary | ICD-10-CM | POA: Diagnosis not present

## 2016-12-06 NOTE — Progress Notes (Signed)
Carelink Summary Report / Loop Recorder 

## 2016-12-07 ENCOUNTER — Telehealth: Payer: Self-pay

## 2016-12-07 NOTE — Telephone Encounter (Signed)
Lm for rtc 

## 2016-12-07 NOTE — Telephone Encounter (Signed)
Pt daughter needs to speak with a nurse about blood in the urine. Please call back.

## 2016-12-07 NOTE — Telephone Encounter (Signed)
Lm again for rtc 

## 2016-12-07 NOTE — Telephone Encounter (Signed)
Lm for rtc, will try again in 56min

## 2016-12-08 ENCOUNTER — Encounter: Payer: Self-pay | Admitting: Internal Medicine

## 2016-12-08 NOTE — Progress Notes (Signed)
[facility] Subjective:  HPI: [name] is a 83 y.o. male who presents for hematuria.  He is accompanied by his daughter [name] [name] today.  She reports that her brother and her have noted gross hematuria (bright red) for the last 3 days.  She called in yesterday to make an appointment.  However she reports that this morning his urine appears clear.  He does have a history of Prostate adenocarcinoma that was treated with prostatectomy. He is on Warfarin due to history of recurrent VTE He is on Aspirin due to history of CAD.  He denies any flank pain, abdominal pain, dysuria.  Daughter reports no history of fevers.   For additional details of his chronic medical problems please see A&P below.  Review of Systems: Review of Systems  Constitutional: Negative for fever.  Gastrointestinal: Negative for abdominal pain, blood in stool, constipation, diarrhea and melena.  Genitourinary: Positive for hematuria. Negative for dysuria, flank pain and frequency.  Musculoskeletal: Negative for falls.    Objective:  Physical Exam: Vitals:   12/09/16 1012  BP: 140/78  Pulse: 69  Temp: 98.3 F (36.8 C)  TempSrc: Oral  SpO2: 98%  Weight: 153 lb 11.2 oz (69.7 kg)  Height: 5\' 8"  (1.727 m)  Physical Exam  Constitutional:  Elderly male, NAD, in wheelchair  HENT:  Head: Normocephalic and atraumatic.  Eyes: Conjunctivae are normal.  Cardiovascular: Normal rate, regular rhythm and normal heart sounds.   Pulmonary/Chest: Effort normal and breath sounds normal. No respiratory distress.  Abdominal: Soft. Bowel sounds are normal. He exhibits no distension. There is no tenderness. There is no rebound and no guarding.  Musculoskeletal: He exhibits no edema.   Assessment & Plan:  Gross hematuria A: Gross Hematuria  P: Obtain U/A, if signs of infection will empirically treat.   INR-2.7 Check CBC May need referral to Urology  Statin myopathy He has history of statin  myopathy with Atorvastatin 20mg .  Daugther is concerned about his CAD, wants to know if he could be a cadidate for statin to lower his risk.  A: Statin myopathy  P: -Discussed that benefits of statins are less clear in his age group. -Dauther still would like to try so we will repeat CMP and CK today, start low dose of pravastatin 10mg , will repeat CMP and CK in 3 months.  CAD (coronary artery disease) His chart notes history of nonobstructive 3 vessel disease, however cath in 2016 by Dr Claiborne Billings notes 40-50% stenosis of LAD.  He was previously treated with Atorvastatin 20mg  (d/c due to statin myopathy) and ASA 81mg  daily.  He recently saw Dr [name] who is unsure about the long term benefits of his ASA therapy but will defer to his primary cardiologist Dr Meda Coffee. He has no chest pain today but daughter is concerned about his CAD.  A: CAD  P: -Dr [name] had previously spoken to daughter about discussing more of a palliative care approach with me.  I attempted to discuss that he likely does not need aggressive treatment of his CAD.  [name] [name] feels that the medication mechanism of action should still work and does not want him to have a heart attack,  She is not ready yet for a more palliative care approach.  So we will continue ASA 81mg  for now (pending workup of his hematuria) I encouraged her to also discuss this with Dr Meda Coffee.  We have also started low dose Pravastatin 10mg  (see statin myopathy below)   Medications Ordered Meds ordered  this encounter  Medications  . pravastatin (PRAVACHOL) 10 MG tablet    Sig: Take 1 tablet (10 mg total) by mouth daily.    Dispense:  90 tablet    Refill:  0   Other Orders Orders Placed This Encounter  Procedures  . Urinalysis, Reflex Microscopic  . CBC no Diff  . CMP14 + Anion Gap  . CK, total  . POCT INR   Follow Up: Return if symptoms worsen or fail to improve.  Has appointment 02/17/17

## 2016-12-09 ENCOUNTER — Ambulatory Visit (INDEPENDENT_AMBULATORY_CARE_PROVIDER_SITE_OTHER): Payer: Medicare HMO | Admitting: Pharmacist

## 2016-12-09 ENCOUNTER — Encounter: Payer: Self-pay | Admitting: Internal Medicine

## 2016-12-09 ENCOUNTER — Ambulatory Visit (INDEPENDENT_AMBULATORY_CARE_PROVIDER_SITE_OTHER): Payer: Medicare HMO | Admitting: Internal Medicine

## 2016-12-09 VITALS — BP 140/78 | HR 69 | Temp 98.3°F | Ht 68.0 in | Wt 153.7 lb

## 2016-12-09 DIAGNOSIS — Z8546 Personal history of malignant neoplasm of prostate: Secondary | ICD-10-CM

## 2016-12-09 DIAGNOSIS — G720 Drug-induced myopathy: Secondary | ICD-10-CM | POA: Diagnosis not present

## 2016-12-09 DIAGNOSIS — Z86711 Personal history of pulmonary embolism: Secondary | ICD-10-CM

## 2016-12-09 DIAGNOSIS — Z7901 Long term (current) use of anticoagulants: Secondary | ICD-10-CM

## 2016-12-09 DIAGNOSIS — Z7982 Long term (current) use of aspirin: Secondary | ICD-10-CM

## 2016-12-09 DIAGNOSIS — T466X1A Poisoning by antihyperlipidemic and antiarteriosclerotic drugs, accidental (unintentional), initial encounter: Secondary | ICD-10-CM | POA: Diagnosis not present

## 2016-12-09 DIAGNOSIS — R310 Gross hematuria: Secondary | ICD-10-CM

## 2016-12-09 DIAGNOSIS — Z9079 Acquired absence of other genital organ(s): Secondary | ICD-10-CM | POA: Diagnosis not present

## 2016-12-09 DIAGNOSIS — I2510 Atherosclerotic heart disease of native coronary artery without angina pectoris: Secondary | ICD-10-CM

## 2016-12-09 DIAGNOSIS — T466X5A Adverse effect of antihyperlipidemic and antiarteriosclerotic drugs, initial encounter: Secondary | ICD-10-CM

## 2016-12-09 DIAGNOSIS — Z86718 Personal history of other venous thrombosis and embolism: Secondary | ICD-10-CM

## 2016-12-09 DIAGNOSIS — I2583 Coronary atherosclerosis due to lipid rich plaque: Secondary | ICD-10-CM

## 2016-12-09 DIAGNOSIS — Z7902 Long term (current) use of antithrombotics/antiplatelets: Secondary | ICD-10-CM

## 2016-12-09 DIAGNOSIS — Z8739 Personal history of other diseases of the musculoskeletal system and connective tissue: Secondary | ICD-10-CM

## 2016-12-09 LAB — POCT INR: INR: 2.7

## 2016-12-09 MED ORDER — PRAVASTATIN SODIUM 10 MG PO TABS: 10.0000 mg | ORAL_TABLET | Freq: Every day | ORAL | 0 refills | Status: DC

## 2016-12-09 NOTE — Assessment & Plan Note (Addendum)
A: Gross Hematuria  P: Obtain U/A, if signs of infection will empirically treat.   INR-2.7 Check CBC May need referral to Urology

## 2016-12-09 NOTE — Patient Instructions (Signed)
Patient instructed to take medications as defined in the Anti-coagulation Track section of this encounter.  Patient instructed to take today's dose.  Patient instructed to take 1/2 tablet of your 5mg  peach-colored warfarin tablet by mouth, once-daily at [facility] on Mondays,Wednesdays and Fridays; all other days, take 1 tablet by mouth at [facility]. Patient verbalized understanding of these instructions.

## 2016-12-09 NOTE — Telephone Encounter (Signed)
Pt was seen today.

## 2016-12-09 NOTE — Patient Instructions (Signed)
I will call with the results of the labwork.

## 2016-12-09 NOTE — Assessment & Plan Note (Signed)
He has history of statin myopathy with Atorvastatin 20mg .  Daugther is concerned about his CAD, wants to know if he could be a cadidate for statin to lower his risk.  A: Statin myopathy  P: -Discussed that benefits of statins are less clear in his age group. -Dauther still would like to try so we will repeat CMP and CK today, start low dose of pravastatin 10mg , will repeat CMP and CK in 3 months.

## 2016-12-09 NOTE — Progress Notes (Signed)
Anti-Coagulation Progress Note  [name] [name] is a 83 y.o. male who is currently on an anti-coagulation regimen.    RECENT RESULTS: Recent results are below, the most recent result is correlated with a dose of 27.5 mg. per week: Lab Results  Component Value Date   INR 2.70 12/09/2016   INR 2.0 11/22/2016   INR 2.20 11/15/2016    ANTI-COAG DOSE: Anticoagulation Dose Instructions as of 12/09/2016      [name] Tue Wed Thu Fri Sat   New Dose 5 mg 2.5 mg 5 mg 2.5 mg 5 mg 2.5 mg 5 mg    Description   Give 1/2 tablet by mouth on Mondays, Wednesdays and Fridays; all other days--give 1 tablet by mouth once-daily at 6PM.       ANTICOAG SUMMARY: Anticoagulation Episode Summary    Current INR goal:   2.0-3.0  TTR:   71.2 % (5.9 y)  Next INR check:   01/03/2017  INR from last check:   2.70 (12/09/2016)  Weekly max dose:     Target end date:   Indefinite  [facility] check [facility]   [facility]  Preferred lab:     Send INR reminders to:   ANTICOAG IMP   Indications   History of pulmonary embolism [Z86.711] Encounter for long-term use of antiplatelets/antithrombotics [Z79.02]       Comments:   History of multiple venous embolic episodes. Will continue to annual re-evaluate continued need for warfarin weighing risks vs. benefits.       Anticoagulation Care Providers    Provider Role Specialty Phone number   Bertha Stakes, MD  Internal Medicine [phone]      ANTICOAG TODAY: Anticoagulation Summary  As of 12/09/2016   INR goal:   2.0-3.0  TTR:     Today's INR:   2.70  Next INR check:   01/03/2017  Target end date:   Indefinite   Indications   History of pulmonary embolism [Z86.711] Encounter for long-term use of antiplatelets/antithrombotics [Z79.02]        Anticoagulation Episode Summary    [facility] check [facility]   [facility]   Preferred lab:      Send INR reminders to:   ANTICOAG IMP   Comments:   History of multiple venous embolic episodes. Will continue to annual  re-evaluate continued need for warfarin weighing risks vs. benefits.     Anticoagulation Care Providers    Provider Role Specialty Phone number   Bertha Stakes, MD  Internal Medicine [phone]      PATIENT INSTRUCTIONS: Patient Instructions  Patient instructed to take medications as defined in the Anti-coagulation Track section of this encounter.  Patient instructed to take today's dose.  Patient instructed to take 1/2 tablet of your 5mg  peach-colored warfarin tablet by mouth, once-daily at [facility] on Mondays,Wednesdays and Fridays; all other days, take 1 tablet by mouth at [facility]. Patient verbalized understanding of these instructions.       FOLLOW-UP Return in 4 weeks (on 01/03/2017) for Follow up INR at [place], [name] Pharm.D., CACP

## 2016-12-09 NOTE — Assessment & Plan Note (Signed)
His chart notes history of nonobstructive 3 vessel disease, however cath in 2016 by Dr Claiborne Billings notes 40-50% stenosis of LAD.  He was previously treated with Atorvastatin 20mg  (d/c due to statin myopathy) and ASA 81mg  daily.  He recently saw Dr [name] who is unsure about the long term benefits of his ASA therapy but will defer to his primary cardiologist Dr Meda Coffee. He has no chest pain today but daughter is concerned about his CAD.  A: CAD  P: -Dr [name] had previously spoken to daughter about discussing more of a palliative care approach with me.  I attempted to discuss that he likely does not need aggressive treatment of his CAD.  [name] [name] feels that the medication mechanism of action should still work and does not want him to have a heart attack,  She is not ready yet for a more palliative care approach.  So we will continue ASA 81mg  for now (pending workup of his hematuria) I encouraged her to also discuss this with Dr Meda Coffee.  We have also started low dose Pravastatin 10mg  (see statin myopathy below)

## 2016-12-10 ENCOUNTER — Telehealth: Payer: Self-pay

## 2016-12-10 ENCOUNTER — Other Ambulatory Visit: Payer: Medicare HMO

## 2016-12-10 ENCOUNTER — Encounter: Payer: Self-pay | Admitting: Internal Medicine

## 2016-12-10 DIAGNOSIS — R310 Gross hematuria: Secondary | ICD-10-CM | POA: Diagnosis not present

## 2016-12-10 LAB — CMP14 + ANION GAP
ALBUMIN: 3.9 g/dL (ref 3.5–4.7)
ALT: 9 IU/L (ref 0–44)
AST: 27 IU/L (ref 0–40)
Albumin/Globulin Ratio: 1.4 (ref 1.2–2.2)
Alkaline Phosphatase: 43 IU/L (ref 39–117)
Anion Gap: 15 mmol/L (ref 10.0–18.0)
BILIRUBIN TOTAL: 0.3 mg/dL (ref 0.0–1.2)
BUN/Creatinine Ratio: 13 (ref 10–24)
BUN: 14 mg/dL (ref 8–27)
CALCIUM: 9.3 mg/dL (ref 8.6–10.2)
CHLORIDE: 103 mmol/L (ref 96–106)
CO2: 26 mmol/L (ref 18–29)
Creatinine, Ser: 1.09 mg/dL (ref 0.76–1.27)
GFR calc Af Amer: 72 mL/min/{1.73_m2} (ref >59)
GFR calc non Af Amer: 62 mL/min/{1.73_m2} (ref >59)
GLUCOSE: 88 mg/dL (ref 65–99)
Globulin, Total: 2.7 g/dL (ref 1.5–4.5)
POTASSIUM: 4.1 mmol/L (ref 3.5–5.2)
Sodium: 144 mmol/L (ref 134–144)
TOTAL PROTEIN: 6.6 g/dL (ref 6.0–8.5)

## 2016-12-10 LAB — CBC
HEMOGLOBIN: 13.1 g/dL (ref 13.0–17.7)
Hematocrit: 40.8 % (ref 37.5–51.0)
MCH: 29.2 pg (ref 26.6–33.0)
MCHC: 32.1 g/dL (ref 31.5–35.7)
MCV: 91 fL (ref 79–97)
Platelets: 186 10*3/uL (ref 150–379)
RBC: 4.48 x10E6/uL (ref 4.14–5.80)
RDW: 16.3 % — ABNORMAL HIGH (ref 12.3–15.4)
WBC: 4.1 10*3/uL (ref 3.4–10.8)

## 2016-12-10 LAB — CK: Total CK: 148 U/L (ref 24–204)

## 2016-12-10 MED ORDER — SULFAMETHOXAZOLE-TRIMETHOPRIM 800-160 MG PO TABS: 1.0000 | ORAL_TABLET | Freq: Two times a day (BID) | ORAL | 0 refills | Status: AC

## 2016-12-10 NOTE — Telephone Encounter (Signed)
Thank you, I called [name], I will provide him a course of Bactrim DS 1 pill BID for 1 week.  Will add on a urine culture to my U/A order. I also asked that [name] hold his warfarin for the next 2 days.  I will give them a call early next week with urine culture results.  [name] [name]

## 2016-12-10 NOTE — Telephone Encounter (Signed)
Kristin at [facility] wanted to be sure Dr Heber Arcade was aware of increase bleeding with Warfarin and Bactrim. Talked to Dr Heber Rozel -stated he's aware and  discussed with pt's daughter. (see today's telephone note)  Erasmo Downer informed/stated will get med ready for pt.

## 2016-12-10 NOTE — Telephone Encounter (Signed)
Per daughter urine order is foul, patient is complaining pain when urinating and is more confused than normal. Is on way to bring urine collection requesting that MD call in something so patient will not have to suffer over the weekend while waiting on results of urine test.

## 2016-12-10 NOTE — Addendum Note (Signed)
Addended by: Joni Reining C on: 12/10/2016 03:02 PM   Modules accepted: Orders

## 2016-12-10 NOTE — Telephone Encounter (Signed)
[name] [name] from [place] needs to speak with a nurse about sulfamethoxazole-trimethoprim (BACTRIM DS,SEPTRA DS) 800-160 MG tablet. Please call back.

## 2016-12-11 ENCOUNTER — Other Ambulatory Visit: Payer: Self-pay | Admitting: Internal Medicine

## 2016-12-11 LAB — URINALYSIS, ROUTINE W REFLEX MICROSCOPIC
BILIRUBIN UA: NEGATIVE
Glucose, UA: NEGATIVE
KETONES UA: NEGATIVE
NITRITE UA: NEGATIVE
SPEC GRAV UA: 1.015 (ref 1.005–1.030)
Urobilinogen, Ur: 0.2 mg/dL (ref 0.2–1.0)
pH, UA: 6.5 (ref 5.0–7.5)

## 2016-12-11 LAB — MICROSCOPIC EXAMINATION
Casts: NONE SEEN /lpf
RBC, UA: >30 /hpf — AB (ref 0–?)
WBC, UA: >30 /hpf — AB (ref 0–?)

## 2016-12-13 ENCOUNTER — Ambulatory Visit: Payer: Medicare HMO

## 2016-12-13 LAB — URINE CULTURE

## 2016-12-13 MED ORDER — CEPHALEXIN 500 MG PO CAPS: 500.0000 mg | ORAL_CAPSULE | Freq: Two times a day (BID) | ORAL | 0 refills | Status: AC

## 2016-12-13 NOTE — Addendum Note (Signed)
Addended by: [name] on: 12/13/2016 08:56 AM   Modules accepted: Orders

## 2016-12-15 LAB — CUP PACEART REMOTE DEVICE CHECK
Implantable Pulse Generator Implant Date: 20160328
MDC IDC SESS DTM: 20180319054023

## 2016-12-16 ENCOUNTER — Telehealth: Payer: Self-pay | Admitting: Neurology

## 2016-12-16 NOTE — Telephone Encounter (Signed)
Rn consulted with Dr. Leonie Man about this telephone call. Per Dr Leonie Man daughter needs to contact PCP office.The PCP is treating the pt for UTI, and prescribing antibiotics. Rn call [name] to notify her per Dr. Leonie Man she needs to call PCP about pts UTI and antibiotic. PTs daughter verbalized understanding.

## 2016-12-16 NOTE — Telephone Encounter (Signed)
Pt's daughter called said he on an antibiotic for UTI on 3/24 by PCP. She said the CNA's said he is not walking well for the past 3 days. She is wanting to know if that has something to do with the UTI treatment or not. Please call

## 2016-12-24 ENCOUNTER — Other Ambulatory Visit: Payer: Self-pay | Admitting: *Deleted

## 2016-12-24 DIAGNOSIS — E876 Hypokalemia: Secondary | ICD-10-CM

## 2016-12-24 MED ORDER — POTASSIUM CHLORIDE CRYS ER 20 MEQ PO TBCR: 40.0000 meq | EXTENDED_RELEASE_TABLET | Freq: Every day | ORAL | 2 refills | Status: DC

## 2017-01-03 ENCOUNTER — Ambulatory Visit (INDEPENDENT_AMBULATORY_CARE_PROVIDER_SITE_OTHER): Payer: Medicare HMO | Admitting: Pharmacist

## 2017-01-03 ENCOUNTER — Encounter (INDEPENDENT_AMBULATORY_CARE_PROVIDER_SITE_OTHER): Payer: Self-pay

## 2017-01-03 DIAGNOSIS — Z7901 Long term (current) use of anticoagulants: Secondary | ICD-10-CM

## 2017-01-03 DIAGNOSIS — Z86711 Personal history of pulmonary embolism: Secondary | ICD-10-CM

## 2017-01-03 DIAGNOSIS — Z7902 Long term (current) use of antithrombotics/antiplatelets: Secondary | ICD-10-CM

## 2017-01-03 LAB — POCT INR: INR: 2.6

## 2017-01-03 NOTE — Patient Instructions (Signed)
Patient instructed to take medications as defined in the Anti-coagulation Track section of this encounter.  Patient instructed to take today's dose.  Patient instructed to take one (1) tablet by mouth daily at [facility] on Sundays, Tuesdays, Thursdays and Saturdays; all other days (Mondays, Wednesdays and Fridays)--take ONLY 1/2 tablet.  Patient verbalized understanding of these instructions.

## 2017-01-03 NOTE — Progress Notes (Signed)
Anti-Coagulation Progress Note  [name] [name] is a 83 y.o. male who is currently on an anti-coagulation regimen.    RECENT RESULTS: Recent results are below, the most recent result is correlated with a dose of 27.5 mg. per week: Lab Results  Component Value Date   INR 2.60 01/03/2017   INR 2.70 12/09/2016   INR 2.0 11/22/2016    ANTI-COAG DOSE: Anticoagulation Dose Instructions as of 01/03/2017      [name] Tue Wed Thu Fri Sat   New Dose 5 mg 2.5 mg 5 mg 2.5 mg 5 mg 2.5 mg 5 mg    Description   Give 1/2 tablet by mouth on Mondays, Wednesdays and Fridays; all other days--give 1 tablet by mouth once-daily at 6PM.       ANTICOAG SUMMARY: Anticoagulation Episode Summary    Current INR goal:   2.0-3.0  TTR:   71.6 % (6 y)  Next INR check:   01/24/2017  INR from last check:   2.60 (01/03/2017)  Weekly max dose:     Target end date:   Indefinite  [facility]   [facility]  Preferred lab:     Send INR reminders to:   ANTICOAG IMP   Indications   History of pulmonary embolism [Z86.711] Encounter for long-term use of antiplatelets/antithrombotics [Z79.02]       Comments:   History of multiple venous embolic episodes. Will continue to annual re-evaluate continued need for warfarin weighing risks vs. benefits.       Anticoagulation Care Providers    Provider Role Specialty Phone number   Bertha Stakes, MD  Internal Medicine [phone]      ANTICOAG TODAY: Anticoagulation Summary  As of 01/03/2017   INR goal:   2.0-3.0  TTR:     Today's INR:   2.60  Next INR check:   01/24/2017  Target end date:   Indefinite   Indications   History of pulmonary embolism [Z86.711] Encounter for long-term use of antiplatelets/antithrombotics [Z79.02]        Anticoagulation Episode Summary    [facility]   [facility]   Preferred lab:      Send INR reminders to:   ANTICOAG IMP   Comments:   History of multiple venous embolic episodes. Will continue to annual  re-evaluate continued need for warfarin weighing risks vs. benefits.     Anticoagulation Care Providers    Provider Role Specialty Phone number   Bertha Stakes, MD  Internal Medicine [phone]      PATIENT INSTRUCTIONS: Patient Instructions  Patient instructed to take medications as defined in the Anti-coagulation Track section of this encounter.  Patient instructed to take today's dose.  Patient instructed to take one (1) tablet by mouth daily at [facility] on Sundays, Tuesdays, Thursdays and Saturdays; all other days (Mondays, Wednesdays and Fridays)--take ONLY 1/2 tablet.  Patient verbalized understanding of these instructions.       FOLLOW-UP Return in about 3 weeks (around 01/24/2017) for Follow up INR at 2:30PM.  Jorene Guest, [name] Pharm.D., CACP

## 2017-01-05 ENCOUNTER — Ambulatory Visit (INDEPENDENT_AMBULATORY_CARE_PROVIDER_SITE_OTHER): Payer: Medicare HMO | Admitting: *Deleted

## 2017-01-05 DIAGNOSIS — R55 Syncope and collapse: Secondary | ICD-10-CM

## 2017-01-05 NOTE — Progress Notes (Signed)
Carelink Summary Report / Loop Recorder 

## 2017-01-07 ENCOUNTER — Telehealth: Payer: Self-pay | Admitting: Internal Medicine

## 2017-01-07 NOTE — Telephone Encounter (Signed)
[facility] After Hours Telephone Line   Person calling: Daughter of [name] [name]  Reason for call: UTI like symptoms   Called returned, redirected to the correct on-call team   [place] PGY-2 [place]

## 2017-01-12 ENCOUNTER — Telehealth: Payer: Self-pay

## 2017-01-12 ENCOUNTER — Ambulatory Visit: Payer: Medicare HMO | Admitting: Podiatry

## 2017-01-12 NOTE — Telephone Encounter (Signed)
Dr Heber [place] tx pt earlier this month for E coli UTI. He is on warfarin complicating the matter. I tried to page Dr Heber [place] but as it is after hours he was unable to get the page. IF urgent, he will need to go to UC. Otherwise, Dr Heber [place] may address in AM.

## 2017-01-12 NOTE — Telephone Encounter (Signed)
Returned called to daughter she reports patient's urine has a foul odor again and he is having difficulty walking like he did last time he had UTI, he also has diarrhea denies fever, denies bloody urine. Please advise

## 2017-01-12 NOTE — Telephone Encounter (Signed)
Pt daughter needs to speak with a nurse about UTI. Please call back.

## 2017-01-13 NOTE — Telephone Encounter (Signed)
I called [name] [name], she is concerned [name] [name] seems slightly more confused and less steady on his feet, he has no complaints as usual but again with foul smelling urine. I am concerned he could have another UTI but given he recently had one this could be resistant and I also do not know his current INR,  I would like him to be seen ASAP.  I asked triage to see if we can fit him in today or tomorrow. Cindee Salt

## 2017-01-13 NOTE — Telephone Encounter (Addendum)
Speaking with andrea, tomorrow at about 1415 will be the best time for her schedule, spoke w/ doriss. She is adding him on for fri at 1415 [place] Confirmed w/ andrea and also checked w/ tracey about home urine collection, andrea will attempt to get sample tomorrow mid morning

## 2017-01-13 NOTE — Telephone Encounter (Signed)
Thank you Helen 

## 2017-01-13 NOTE — Telephone Encounter (Signed)
Called [name] [name] this am, lm for either callback or go to ED or urg care, reviewed chart, see no visit to ED or urg care

## 2017-01-14 ENCOUNTER — Encounter: Payer: Self-pay | Admitting: Internal Medicine

## 2017-01-14 ENCOUNTER — Ambulatory Visit (INDEPENDENT_AMBULATORY_CARE_PROVIDER_SITE_OTHER): Payer: Medicare HMO | Admitting: Internal Medicine

## 2017-01-14 VITALS — BP 136/107 | HR 80 | Temp 97.5°F | Ht 68.0 in | Wt 147.9 lb

## 2017-01-14 DIAGNOSIS — G20 Parkinson's disease: Secondary | ICD-10-CM | POA: Diagnosis not present

## 2017-01-14 DIAGNOSIS — R8290 Unspecified abnormal findings in urine: Secondary | ICD-10-CM

## 2017-01-14 DIAGNOSIS — Z87440 Personal history of urinary (tract) infections: Secondary | ICD-10-CM | POA: Diagnosis not present

## 2017-01-14 DIAGNOSIS — Z8619 Personal history of other infectious and parasitic diseases: Secondary | ICD-10-CM | POA: Diagnosis not present

## 2017-01-14 LAB — POCT URINALYSIS DIPSTICK
BILIRUBIN UA: NEGATIVE
Blood, UA: NEGATIVE
Glucose, UA: NEGATIVE
KETONES UA: NEGATIVE
Leukocytes, UA: NEGATIVE
Nitrite, UA: NEGATIVE
Spec Grav, UA: 1.025 (ref 1.010–1.025)
Urobilinogen, UA: 0.2 E.U./dL
pH, UA: 5 (ref 5.0–8.0)

## 2017-01-14 NOTE — Progress Notes (Signed)
   CC: "Possible UTI"  HPI:  [name] is a 83 y.o. man with PMHx as noted below who presents today for evaluation of possible UTI. Patient is accompanied by his daughter, who gives some of the history.  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 She notes he has acted this way before with prior infections. Daughter also notes his urine has a foul odor which started a few days ago. Patient was just treated for a pansensitive E coli UTI in late March with Bactrim.   Patient denies any dysuria, hematuria, frequency, back pain/flank pain, fevers, nausea, vomiting, or abdominal pain. He reports feeling fine. He has been eating and drinking normally.     Past Medical History:  Diagnosis Date  . Anxiety   . Coronary artery disease, non-occlusive    with history of MI; Cath 2008 w multivessel nonobstructive CAD  . Dementia   . Depression   . GERD (gastroesophageal reflux disease)   . Hyperlipidemia   . Hypertension   . PE (pulmonary embolism)    unprovoked PE completed 6 months of warfarin, warfarin d/ced 02/12/2010, repeat PE 07/10/2010 after a long car ride and now on lifelong coumadin.  . Pituitary microadenoma ([place])    incidental finding CT 12/2009  . Prostate cancer ([place])   . Scoliosis   . 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  Patient  discussed with Dr. Lynnae January

## 2017-01-14 NOTE — Patient Instructions (Addendum)
General Instructions: - Will send for full urinalysis and culture - Will call you with results and if antibiotics needed  Please bring your medicines with you each time you come to clinic.  Medicines may include prescription medications, over-the-counter medications, herbal remedies, eye drops, vitamins, or other pills.   Progress Toward Treatment Goals:  Treatment Goal 04/11/2013  Blood pressure at goal  Prevent falls improved    Self Care Goals & Plans:  Self Care Goal 02/23/2016  Manage my medications take my medicines as prescribed; bring my medications to every visit; refill my medications on time  Monitor my health keep track of my blood pressure  Eat healthy foods drink diet soda or water instead of juice or soda; eat more vegetables; eat foods that are low in salt; eat baked foods instead of fried foods; eat fruit for snacks and desserts  Be physically active find an activity I enjoy  Prevent falls have my vision checked  Meeting treatment goals maintain the current self-care plan    No flowsheet data found.   Care Management & Community Referrals:  Referral 01/25/2013  Referrals made to community resources exercise/physical therapy

## 2017-01-15 LAB — URINALYSIS, ROUTINE W REFLEX MICROSCOPIC
Bilirubin, UA: NEGATIVE
Glucose, UA: NEGATIVE
Ketones, UA: NEGATIVE
Leukocytes, UA: NEGATIVE
Nitrite, UA: NEGATIVE
PH UA: 5 (ref 5.0–7.5)
PROTEIN UA: NEGATIVE
RBC, UA: NEGATIVE
Specific Gravity, UA: 1.022 (ref 1.005–1.030)
UUROB: 0.2 mg/dL (ref 0.2–1.0)

## 2017-01-16 LAB — URINE CULTURE

## 2017-01-17 DIAGNOSIS — R8290 Unspecified abnormal findings in urine: Secondary | ICD-10-CM | POA: Insufficient documentation

## 2017-01-17 NOTE — Progress Notes (Signed)
Internal Medicine Clinic Attending  Case discussed with Dr. Rivet soon after the resident saw the patient.  We reviewed the resident's history and exam and pertinent patient test results.  I agree with the assessment, diagnosis, and plan of care documented in the resident's note.  

## 2017-01-17 NOTE — Assessment & Plan Note (Signed)
Daughter reporting a foul odor of the urine for the last few days. Patient has a hx of parkinsonian syndrome which causes him to have delayed cognition at times. He is alert and oriented x 3 on exam and able to answer all of my questions appropriately. We were able to obtain a urine sample today and urine dipstick was negative for any signs of infection. Will send for UA and urine culture to ensure no infection. Reassured daughter and patient that he is unlikely to have a UTI given he lacks symptoms and his exam is normal.   Update: UA and culture negative for infection. Will call to let daughter know results.

## 2017-01-19 ENCOUNTER — Telehealth: Payer: Self-pay | Admitting: Cardiology

## 2017-01-19 ENCOUNTER — Ambulatory Visit: Payer: Medicare HMO | Admitting: Podiatry

## 2017-01-19 NOTE — Telephone Encounter (Signed)
LMOVM requesting that pt send manual transmission b/c home monitor has not updated in at least 14 days.    

## 2017-01-21 LAB — CUP PACEART REMOTE DEVICE CHECK
Implantable Pulse Generator Implant Date: 20160328
MDC IDC SESS DTM: 20180418060746

## 2017-01-24 ENCOUNTER — Ambulatory Visit (INDEPENDENT_AMBULATORY_CARE_PROVIDER_SITE_OTHER): Payer: Medicare HMO | Admitting: Pharmacist

## 2017-01-24 DIAGNOSIS — Z7901 Long term (current) use of anticoagulants: Secondary | ICD-10-CM

## 2017-01-24 DIAGNOSIS — Z86711 Personal history of pulmonary embolism: Secondary | ICD-10-CM | POA: Diagnosis not present

## 2017-01-24 DIAGNOSIS — Z7902 Long term (current) use of antithrombotics/antiplatelets: Secondary | ICD-10-CM

## 2017-01-24 LAB — POCT INR: INR: 2.5

## 2017-01-24 NOTE — Progress Notes (Signed)
Anti-Coagulation Progress Note  [name] [name] is a 83 y.o. male who is currently on an anti-coagulation regimen for the indications of history of pulmonary embolism [Z86.71], encounter for long-term use of antithrombotics [Z79.02] due to history of multiple venous embolic episodes. Will continue to annually re-evaluate continued need for warfarin weighing  Risks vs. Benefits.    RECENT RESULTS: Recent results are below, the most recent result is correlated with a dose of 27.5 mg. per week: Lab Results  Component Value Date   INR 2.50 01/24/2017   INR 2.60 01/03/2017   INR 2.70 12/09/2016    ANTI-COAG DOSE: Anticoagulation Dose Instructions as of 01/24/2017      [name] Tue Wed Thu Fri Sat   New Dose 5 mg 2.5 mg 5 mg 2.5 mg 5 mg 2.5 mg 5 mg    Description   Give 1/2 tablet by mouth on Mondays, Wednesdays and Fridays; all other days--give 1 tablet by mouth once-daily at 6PM.       ANTICOAG SUMMARY: Anticoagulation Episode Summary    Current INR goal:   2.0-3.0  TTR:   71.8 % (6.1 y)  Next INR check:   02/21/2017  INR from last check:   2.50 (01/24/2017)  Weekly max dose:     Target end date:   Indefinite  [facility] check [facility]   [facility]  Preferred lab:     Send INR reminders to:   ANTICOAG IMP   Indications   History of pulmonary embolism [Z86.711] Encounter for long-term use of antiplatelets/antithrombotics [Z79.02]       Comments:   History of multiple venous embolic episodes. Will continue to annual re-evaluate continued need for warfarin weighing risks vs. benefits.       Anticoagulation Care Providers    Provider Role Specialty Phone number   Bertha Stakes, MD  Internal Medicine [phone]      ANTICOAG TODAY: Anticoagulation Summary  As of 01/24/2017   INR goal:   2.0-3.0  TTR:     Today's INR:   2.50  Next INR check:   02/21/2017  Target end date:   Indefinite   Indications   History of pulmonary embolism [Z86.711] Encounter for long-term use of  antiplatelets/antithrombotics [Z79.02]        Anticoagulation Episode Summary    IN[facility] check [facility]   [facility]   Preferred lab:      Send INR reminders to:   ANTICOAG IMP   Comments:   History of multiple venous embolic episodes. Will continue to annual re-evaluate continued need for warfarin weighing risks vs. benefits.     Anticoagulation Care Providers    Provider Role Specialty Phone number   Bertha Stakes, MD  Internal Medicine [phone]      PATIENT INSTRUCTIONS: Patient Instructions  Patient's daughter  instructed to give medications as defined in the Anti-coagulation Track section of this encounter.  Patient's daughter  instructed to give today's dose.  Patient's daughter instructed to give 1/2 tablet by mouth on Mondays, Wednesdays and Fridays; all other days--give 1 tablet by mouth once-daily at [place].  Patient's daughter  verbalized understanding of these instructions.      FOLLOW-UP Return in 4 weeks (on 02/21/2017) for Follow up INR at 3:00PM.  Jorene Guest, [name] Pharm.D., CACP

## 2017-01-24 NOTE — Progress Notes (Signed)
Indication: Recurrent venous thromboembolism. Duration: Indefinite. INR: At target. Agree with Dr. Groce's assessment and plan. 

## 2017-01-24 NOTE — Patient Instructions (Signed)
Patient's daughter  instructed to give medications as defined in the Anti-coagulation Track section of this encounter.  Patient's daughter  instructed to give today's dose.  Patient's daughter instructed to give 1/2 tablet by mouth on Mondays, Wednesdays and Fridays; all other days--give 1 tablet by mouth once-daily at [place].  Patient's daughter  verbalized understanding of these instructions.

## 2017-02-02 ENCOUNTER — Ambulatory Visit (INDEPENDENT_AMBULATORY_CARE_PROVIDER_SITE_OTHER): Payer: Medicare HMO | Admitting: Internal Medicine

## 2017-02-02 ENCOUNTER — Encounter: Payer: Self-pay | Admitting: Internal Medicine

## 2017-02-02 ENCOUNTER — Telehealth: Payer: Self-pay | Admitting: *Deleted

## 2017-02-02 VITALS — BP 138/86 | HR 69 | Temp 98.1°F | Resp 20 | Ht 68.0 in | Wt 152.4 lb

## 2017-02-02 DIAGNOSIS — R310 Gross hematuria: Secondary | ICD-10-CM

## 2017-02-02 DIAGNOSIS — N390 Urinary tract infection, site not specified: Secondary | ICD-10-CM | POA: Diagnosis not present

## 2017-02-02 DIAGNOSIS — F0390 Unspecified dementia without behavioral disturbance: Secondary | ICD-10-CM

## 2017-02-02 DIAGNOSIS — B9689 Other specified bacterial agents as the cause of diseases classified elsewhere: Secondary | ICD-10-CM

## 2017-02-02 DIAGNOSIS — Z87440 Personal history of urinary (tract) infections: Secondary | ICD-10-CM

## 2017-02-02 DIAGNOSIS — Z8546 Personal history of malignant neoplasm of prostate: Secondary | ICD-10-CM | POA: Diagnosis not present

## 2017-02-02 DIAGNOSIS — Z8619 Personal history of other infectious and parasitic diseases: Secondary | ICD-10-CM | POA: Diagnosis not present

## 2017-02-02 LAB — URINALYSIS, ROUTINE W REFLEX MICROSCOPIC
Bilirubin Urine: NEGATIVE
GLUCOSE, UA: NEGATIVE mg/dL
KETONES UR: NEGATIVE mg/dL
Nitrite: POSITIVE — AB
PH: 7 (ref 5.0–8.0)
Protein, ur: 30 mg/dL — AB
SPECIFIC GRAVITY, URINE: 1.013 (ref 1.005–1.030)

## 2017-02-02 MED ORDER — CEPHALEXIN 500 MG PO CAPS: 500.0000 mg | ORAL_CAPSULE | Freq: Two times a day (BID) | ORAL | 0 refills | Status: AC

## 2017-02-02 NOTE — Addendum Note (Signed)
Addended by: Dellia Nims on: 02/02/2017 04:45 PM   Modules accepted: Orders

## 2017-02-02 NOTE — Patient Instructions (Signed)
We will send the urine for culture and urinalysis.  Will call you with the results and antibiotics if indicated.

## 2017-02-02 NOTE — Telephone Encounter (Signed)
Done

## 2017-02-02 NOTE — Progress Notes (Signed)
   CC: blood in urine  HPI:  [name] is a 83 y.o. with PMH as listed below is here with the complaint of blood in urine    Past Medical History:  Diagnosis Date  . Anxiety   . Coronary artery disease, non-occlusive    with history of MI; Cath 2008 w multivessel nonobstructive CAD  . Dementia   . Depression   . GERD (gastroesophageal reflux disease)   . Hyperlipidemia   . Hypertension   . PE (pulmonary embolism)    unprovoked PE completed 6 months of warfarin, warfarin d/ced 02/12/2010, repeat PE 07/10/2010 after a long car ride and now on lifelong coumadin.  . Pituitary microadenoma ([place])    incidental finding CT 12/2009  . Prostate cancer ([place])   . Scoliosis   . 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 Daughter provided the entire history. Has been having intermittent gross hematuria for last 2-3 days. No fevers. Unable to answer question about dysuria or any other symptoms. Is on coumadin for PE. Most recent INR 9 days ago 2.5. Last visit had complaint of foul smelling urine and UA and ucx were normal (no blood, no bacteria, or any other findings). In the past, had E.coli UTI. No n/v or any other symptoms.Has hx of prostate cancer s/p surgery.   Review of Systems:   Review of Systems  Unable to perform ROS: Dementia     Physical Exam:  Vitals:   02/02/17 0945  BP: 138/86  Pulse: 69  Resp: 20  Temp: 98.1 F (36.7 C)  TempSrc: Oral  SpO2: 100%  Weight: 152 lb 6.4 oz (69.1 kg)  Height: 5\' 8"  (1.727 m)   Physical Exam  Constitutional: He appears well-developed and well-nourished.  Elderly male with dementia, unable to answer most of the questions clearly.   Cardiovascular: Normal rate and regular rhythm.  Exam reveals no gallop and no friction rub.   No murmur heard. Respiratory: Effort normal and breath sounds normal.  GI: Soft. Bowel sounds are normal. He  exhibits no distension. There is no tenderness.  No CVA tenderness    Assessment & Plan:   See Encounters Tab for problem based charting.  Patient discussed with Dr. Angelia Mould

## 2017-02-02 NOTE — Telephone Encounter (Signed)
Daughter calling for lab results from [place] this am. Stated patient is now bleeding a little bit more than this am.  Pls call her back [phone]) thanks!

## 2017-02-02 NOTE — Assessment & Plan Note (Addendum)
Patient is having gross hematuria intermittently for last few days. Appears nontoxic, afebrile. He had this in the past. Had ecoli UTI in the past. Last visit he had foul smelling urine and UA and UCX were negative last visit. He is on coumadin which increases his risk of bleeding but unclear what the cause of the bleeding is. No hx of kidney stones. This could be UTI, possibility of malignancy of the bladder with his age (no hx of alcohol or tobacco use).  - will get Urine sample today UA and UCX. If there is infection, we will treat it. If not, will consider referring to urology.   UA + nitrite and +leuk. Will treat UTI with keflex 500mg  bid D31 days for complicated UTI and f/up cultures. Discussed with the daughter on the phone.

## 2017-02-04 ENCOUNTER — Ambulatory Visit (INDEPENDENT_AMBULATORY_CARE_PROVIDER_SITE_OTHER): Payer: Medicare HMO | Admitting: *Deleted

## 2017-02-04 DIAGNOSIS — R55 Syncope and collapse: Secondary | ICD-10-CM

## 2017-02-04 LAB — URINE CULTURE: Culture: >=100000 — AB

## 2017-02-04 NOTE — Progress Notes (Signed)
Internal Medicine Clinic Attending  Case discussed with Dr. Ahmed at the time of the visit.  We reviewed the resident's history and exam and pertinent patient test results.  I agree with the assessment, diagnosis, and plan of care documented in the resident's note. 

## 2017-02-04 NOTE — Progress Notes (Signed)
Carelink Summary Report / Loop Recorder 

## 2017-02-09 ENCOUNTER — Ambulatory Visit (INDEPENDENT_AMBULATORY_CARE_PROVIDER_SITE_OTHER): Payer: Medicare HMO | Admitting: Podiatry

## 2017-02-09 ENCOUNTER — Encounter: Payer: Self-pay | Admitting: Podiatry

## 2017-02-09 DIAGNOSIS — B351 Tinea unguium: Secondary | ICD-10-CM | POA: Diagnosis not present

## 2017-02-09 DIAGNOSIS — M79676 Pain in unspecified toe(s): Secondary | ICD-10-CM

## 2017-02-09 NOTE — Progress Notes (Signed)
Patient ID: [name] [name], male   DOB: Mar 30, 1931, 83 y.o.   MRN: [id number] Complaint:  Visit Type: Patient returns to my office for continued preventative foot care services. Complaint: Patient states" my nails have grown long and thick and become painful to walk and wear shoes" . The patient presents for preventative foot care services. No changes to ROS  Podiatric Exam: Vascular: dorsalis pedis and posterior tibial pulses are palpable bilateral. Capillary return is immediate. Temperature gradient is WNL. Skin turgor WNL  Sensorium: Normal Semmes Weinstein monofilament test. Normal tactile sensation bilaterally. Nail Exam: Pt has thick disfigured discolored nails with subungual debris noted bilateral entire nail hallux through fifth toenails Ulcer Exam: There is no evidence of ulcer or pre-ulcerative changes or infection. Orthopedic Exam: Muscle tone and strength are WNL. No limitations in general ROM. No crepitus or effusions noted. Foot type and digits show no abnormalities. Bony prominences are unremarkable. Hyperpigmentation due to gout. Skin: No Porokeratosis. No infection or ulcers  Diagnosis:  Onychomycosis, , Pain in right toe, pain in left toes  Treatment & Plan Procedures and Treatment: Consent by patient was obtained for treatment procedures. The patient understood the discussion of treatment and procedures well. All questions were answered thoroughly reviewed. Debridement of mycotic and hypertrophic toenails, 1 through 5 bilateral and clearing of subungual debris. No ulceration, no infection noted.  Return Visit-Office Procedure: Patient instructed to return to the office for a follow up visit 10 weeks  for continued evaluation and treatment.   Gardiner Barefoot DPM    Gardiner Barefoot DPM

## 2017-02-10 ENCOUNTER — Ambulatory Visit: Payer: Medicare HMO | Admitting: Internal Medicine

## 2017-02-14 LAB — CUP PACEART REMOTE DEVICE CHECK
Date Time Interrogation Session: 20180518060629
Implantable Pulse Generator Implant Date: 20160328

## 2017-02-17 ENCOUNTER — Encounter: Payer: Self-pay | Admitting: Internal Medicine

## 2017-02-17 ENCOUNTER — Ambulatory Visit (INDEPENDENT_AMBULATORY_CARE_PROVIDER_SITE_OTHER): Payer: Medicare HMO | Admitting: Pharmacist

## 2017-02-17 ENCOUNTER — Ambulatory Visit (INDEPENDENT_AMBULATORY_CARE_PROVIDER_SITE_OTHER): Payer: Medicare HMO | Admitting: Internal Medicine

## 2017-02-17 VITALS — BP 133/92 | HR 113 | Temp 97.8°F | Ht 68.0 in | Wt 149.2 lb

## 2017-02-17 DIAGNOSIS — I10 Essential (primary) hypertension: Secondary | ICD-10-CM | POA: Diagnosis not present

## 2017-02-17 DIAGNOSIS — Z7901 Long term (current) use of anticoagulants: Secondary | ICD-10-CM

## 2017-02-17 DIAGNOSIS — Z87440 Personal history of urinary (tract) infections: Secondary | ICD-10-CM | POA: Diagnosis not present

## 2017-02-17 DIAGNOSIS — G720 Drug-induced myopathy: Secondary | ICD-10-CM

## 2017-02-17 DIAGNOSIS — T50995D Adverse effect of other drugs, medicaments and biological substances, subsequent encounter: Secondary | ICD-10-CM

## 2017-02-17 DIAGNOSIS — Z86718 Personal history of other venous thrombosis and embolism: Secondary | ICD-10-CM | POA: Diagnosis not present

## 2017-02-17 DIAGNOSIS — T466X5A Adverse effect of antihyperlipidemic and antiarteriosclerotic drugs, initial encounter: Principal | ICD-10-CM

## 2017-02-17 DIAGNOSIS — E785 Hyperlipidemia, unspecified: Secondary | ICD-10-CM

## 2017-02-17 DIAGNOSIS — E876 Hypokalemia: Secondary | ICD-10-CM

## 2017-02-17 DIAGNOSIS — Z8546 Personal history of malignant neoplasm of prostate: Secondary | ICD-10-CM

## 2017-02-17 DIAGNOSIS — Z86711 Personal history of pulmonary embolism: Secondary | ICD-10-CM

## 2017-02-17 DIAGNOSIS — Z79899 Other long term (current) drug therapy: Secondary | ICD-10-CM | POA: Diagnosis not present

## 2017-02-17 LAB — POCT INR: INR: 2.9

## 2017-02-17 NOTE — Assessment & Plan Note (Signed)
No complaints with pravastatin 10 mg daily. Will check CMP and CK today.

## 2017-02-17 NOTE — Assessment & Plan Note (Signed)
HPI: No complaints.  A: Essential HTN, mildly above goal  P: WIll continue Amlodipine 2.5mg  daily

## 2017-02-17 NOTE — Progress Notes (Signed)
[facility] Subjective:  HPI: [name] is a 83 y.o. male who presents for follow up HTN, recurrent UTI.  Please see problem based charting below for the status of his chronic medical problems.  He is accompanied today by his daughter [name] [name] (primary care giver)   Review of Systems: Review of Systems  Gastrointestinal: Negative for abdominal pain, constipation and diarrhea.  Genitourinary: Negative for dysuria and hematuria.  Musculoskeletal: Positive for back pain and falls.    Objective:  Physical Exam: Vitals:   02/17/17 1049  BP: (!) 133/92  Pulse: (!) 113  Temp: 97.8 F (36.6 C)  TempSrc: Oral  SpO2: 100%  Weight: 149 lb 3.2 oz (67.7 kg)  Height: 5\' 8"  (1.727 m)  Physical Exam  Constitutional: He is well-developed, well-nourished, and in no distress.  HENT:  Head: Normocephalic and atraumatic.  Cardiovascular: Normal rate and regular rhythm.   Pulmonary/Chest: Effort normal and breath sounds normal. He has no wheezes.  Abdominal: Soft. There is no tenderness.  Musculoskeletal:  No boney or paraspinal tenderness of T and C spine  Neurological:  Minimal cogwheeling of upper extremites, no tremor. Resting in wheelchair, slumped to left side.  Psychiatric:  Flat affect, minimally verbal  Nursing note and vitals reviewed.   Assessment & Plan:  Essential hypertension, benign HPI: No complaints.  A: Essential HTN, mildly above goal  P: WIll continue Amlodipine 2.5mg  daily  Statin myopathy No complaints with pravastatin 10 mg daily. Will check CMP and CK today.  ADENOCARCINOMA, PROSTATE, HX OF He had prostatectomy 20 year ago.  Overall he has no complaints but usually does not complain 2/2 dementia.  He has had some mild back pain likely related to MSK / positioning.  He has had about 3 episdoes of UTI this year which is new.  I offered referral back to Urology and discussed with daughter [name] [name].  Overall our goals for him are  more palliative in nature.  For now will discussed holding off referral to urology unless UTI continue with more frequency.  If back pain become worse we may pursue imaging and possibly repeating PSA but at this point I do not think it would be of much additional value.  Hypokalemia HPI: He has continued on 47mEq of Kdur daily.  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   Medications Ordered No orders of the defined types were placed in this encounter.  Other Orders Orders Placed This Encounter  Procedures  . CMP14 + Anion Gap  . CK, total   Follow Up: Return in about 3 months (around 05/20/2017).

## 2017-02-17 NOTE — Patient Instructions (Signed)
I will call you with the results of the blood work.

## 2017-02-17 NOTE — Progress Notes (Signed)
Anticoagulation Management [name] [name] is a 83 y.o. male who reports to the clinic for monitoring of warfarin treatment.    Indication: PE history Duration: indefinite Supervising physician: Joni Reining  Anticoagulation Clinic Visit History: Patient does not report signs/symptoms of bleeding or thromboembolism  Anticoagulation Episode Summary    Current INR goal:   2.0-3.0  TTR:   72.1 % (6.1 y)  Next INR check:   02/21/2017  INR from last check:   2.9 (02/17/2017)  Weekly max warfarin dose:     Target end date:   Indefinite  INR check location:   [facility]  Preferred lab:     Send INR reminders to:   ANTICOAG IMP   Indications   History of pulmonary embolism [Z86.711] Encounter for long-term use of antiplatelets/antithrombotics [Z79.02]       Comments:   History of multiple venous embolic episodes. Will continue to annual re-evaluate continued need for warfarin weighing risks vs. benefits.       Anticoagulation Care Providers    Provider Role Specialty Phone number   Bertha Stakes, MD  Internal Medicine [phone]     ASSESSMENT Recent Results: The most recent result is correlated with 27.5 mg per week: Lab Results  Component Value Date   INR 2.9 02/17/2017   INR 2.50 01/24/2017   INR 2.60 01/03/2017   Anticoagulation Dosing: INR as of 02/17/2017 and Previous Warfarin Dosing Information    INR Dt INR Goal [employer] Sun Mon Tue Wed Thu Fri Sat   02/17/2017 2.9 2.0-3.0 27.5 mg 5 mg 2.5 mg 5 mg 2.5 mg 5 mg 2.5 mg 5 mg    Previous description   Give 1/2 tablet by mouth on Mondays, Wednesdays and Fridays; all other days--give 1 tablet by mouth once-daily at 6PM.    Anticoagulation Warfarin Dose Instructions as of 02/17/2017      Total Sun Mon Tue Wed Thu Fri Sat   New Dose 27.5 mg 5 mg 2.5 mg 5 mg 2.5 mg 5 mg 2.5 mg 5 mg     (5 mg x 1)  (5 mg x 0.5)  (5 mg x 1)  (5 mg x 0.5)  (5 mg x 1)  (5 mg x 0.5)  (5 mg x 1)                         Description    Give 1/2 tablet by mouth on Mondays, Wednesdays and Fridays; all other days--give 1 tablet by mouth once-daily at 6PM.      INR today: Therapeutic  PLAN Weekly dose was unchanged  Patient Instructions  Patient educated about medication as defined in this encounter and verbalized understanding by repeating back instructions provided.   Patient advised to contact clinic or seek medical attention if signs/symptoms of bleeding or thromboembolism occur.  Patient verbalized understanding by repeating back information and was advised to contact me if further medication-related questions arise. Patient was also provided an information handout.  Follow-up Return in about 3 weeks (around 03/07/2017).  [name] [name]

## 2017-02-17 NOTE — Assessment & Plan Note (Signed)
He had prostatectomy 20 year ago.  Overall he has no complaints but usually does not complain 2/2 dementia.  He has had some mild back pain likely related to MSK / positioning.  He has had about 3 episdoes of UTI this year which is new.  I offered referral back to Urology and discussed with daughter andrea.  Overall our goals for him are more palliative in nature.  For now will discussed holding off referral to urology unless UTI continue with more frequency.  If back pain become worse we may pursue imaging and possibly repeating PSA but at this point I do not think it would be of much additional value.

## 2017-02-17 NOTE — Assessment & Plan Note (Signed)
HPI: He has continued on 76mEq of Kdur daily.  A: hypokalemia  P: Repeat K+ with CMP

## 2017-02-17 NOTE — Assessment & Plan Note (Signed)
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

## 2017-02-17 NOTE — Patient Instructions (Signed)
Patient educated about medication as defined in this encounter and verbalized understanding by repeating back instructions provided.   

## 2017-02-18 LAB — CMP14 + ANION GAP
ALBUMIN: 4.2 g/dL (ref 3.5–4.7)
ALK PHOS: 40 IU/L (ref 39–117)
ALT: 13 IU/L (ref 0–44)
AST: 28 IU/L (ref 0–40)
Albumin/Globulin Ratio: 1.7 (ref 1.2–2.2)
Anion Gap: 12 mmol/L (ref 10.0–18.0)
BILIRUBIN TOTAL: 0.3 mg/dL (ref 0.0–1.2)
BUN / CREAT RATIO: 14 (ref 10–24)
BUN: 14 mg/dL (ref 8–27)
CO2: 26 mmol/L (ref 18–29)
Calcium: 9.4 mg/dL (ref 8.6–10.2)
Chloride: 107 mmol/L — ABNORMAL HIGH (ref 96–106)
Creatinine, Ser: 0.98 mg/dL (ref 0.76–1.27)
GFR calc Af Amer: 82 mL/min/{1.73_m2} (ref >59)
GFR calc non Af Amer: 71 mL/min/{1.73_m2} (ref >59)
GLOBULIN, TOTAL: 2.5 g/dL (ref 1.5–4.5)
Glucose: 67 mg/dL (ref 65–99)
Potassium: 4.6 mmol/L (ref 3.5–5.2)
SODIUM: 145 mmol/L — AB (ref 134–144)
Total Protein: 6.7 g/dL (ref 6.0–8.5)

## 2017-02-18 LAB — CK: CK TOTAL: 159 U/L (ref 24–204)

## 2017-02-21 ENCOUNTER — Ambulatory Visit: Payer: Medicare HMO

## 2017-02-22 ENCOUNTER — Other Ambulatory Visit: Payer: Self-pay | Admitting: *Deleted

## 2017-02-22 MED ORDER — CARBIDOPA-LEVODOPA 25-100 MG PO TABS: 1.0000 | ORAL_TABLET | Freq: Four times a day (QID) | ORAL | 2 refills | Status: DC

## 2017-02-28 ENCOUNTER — Other Ambulatory Visit: Payer: Self-pay | Admitting: Internal Medicine

## 2017-03-02 ENCOUNTER — Other Ambulatory Visit: Payer: Self-pay

## 2017-03-02 DIAGNOSIS — E876 Hypokalemia: Secondary | ICD-10-CM

## 2017-03-02 MED ORDER — POTASSIUM CHLORIDE CRYS ER 20 MEQ PO TBCR: 40.0000 meq | EXTENDED_RELEASE_TABLET | Freq: Every day | ORAL | 2 refills | Status: DC

## 2017-03-07 ENCOUNTER — Other Ambulatory Visit: Payer: Self-pay

## 2017-03-07 ENCOUNTER — Ambulatory Visit (INDEPENDENT_AMBULATORY_CARE_PROVIDER_SITE_OTHER): Payer: Medicare HMO | Admitting: *Deleted

## 2017-03-07 ENCOUNTER — Ambulatory Visit (INDEPENDENT_AMBULATORY_CARE_PROVIDER_SITE_OTHER): Payer: Medicare HMO

## 2017-03-07 DIAGNOSIS — R55 Syncope and collapse: Secondary | ICD-10-CM

## 2017-03-07 DIAGNOSIS — Z7901 Long term (current) use of anticoagulants: Secondary | ICD-10-CM

## 2017-03-07 DIAGNOSIS — Z86711 Personal history of pulmonary embolism: Secondary | ICD-10-CM | POA: Diagnosis not present

## 2017-03-07 DIAGNOSIS — Z7902 Long term (current) use of antithrombotics/antiplatelets: Secondary | ICD-10-CM

## 2017-03-07 LAB — POCT INR: INR: 2.9

## 2017-03-07 NOTE — Progress Notes (Signed)
Anti-Coagulation Progress Note  [name] [name] is a 83 y.o. male who is currently on an anti-coagulation regimen.    RECENT RESULTS: Recent results are below, the most recent result is correlated with a dose of 27.5 mg. per week: Lab Results  Component Value Date   INR 2.9 03/07/2017   INR 2.9 02/17/2017   INR 2.50 01/24/2017    ANTI-COAG DOSE: Anticoagulation Warfarin Dose Instructions as of 03/07/2017      [name] Tue Wed Thu Fri Sat   New Dose 5 mg 2.5 mg 5 mg 2.5 mg 5 mg 2.5 mg 5 mg    Description   Give 1/2 tablet by mouth on Mondays, Wednesdays and Fridays; all other days--give 1 tablet by mouth once-daily at 6PM.       ANTICOAG SUMMARY: Anticoagulation Episode Summary    Current INR goal:   2.0-3.0  TTR:   72.3 % (6.2 y)  Next INR check:   03/28/2017  INR from last check:   2.9 (03/07/2017)  Weekly max warfarin dose:     Target end date:   Indefinite  INR check location:   [facility]  Preferred lab:     Send INR reminders to:   ANTICOAG IMP   Indications   History of pulmonary embolism [Z86.711] Encounter for long-term use of antiplatelets/antithrombotics [Z79.02]       Comments:   History of multiple venous embolic episodes. Will continue to annual re-evaluate continued need for warfarin weighing risks vs. benefits.       Anticoagulation Care Providers    Provider Role Specialty Phone number   Bertha Stakes, MD  Internal Medicine [phone]      ANTICOAG TODAY: Anticoagulation Summary  As of 03/07/2017   INR goal:   2.0-3.0  TTR:     Today's INR:   2.9  Next INR check:   03/28/2017  Target end date:   Indefinite   Indications   History of pulmonary embolism [Z86.711] Encounter for long-term use of antiplatelets/antithrombotics [Z79.02]        Anticoagulation Episode Summary    IN[facility] check location[facility]   [facility]   Preferred lab:      Send INR reminders to:   ANTICOAG IMP   Comments:   History of multiple venous embolic episodes. Will  continue to annual re-evaluate continued need for warfarin weighing risks vs. benefits.     Anticoagulation Care Providers    Provider Role Specialty Phone number   Bertha Stakes, MD  Internal Medicine [phone]      PATIENT INSTRUCTIONS: Patient Instructions  Patient instructed to take medications as defined in the Anti-coagulation Track section of this encounter.  Patient instructed to take today's dose. Continue current regimen of 1 tablet (5mg ) daily except 1/2 tablet (2.5mg ) on Mondays, Wednesdays, and Fridays Patient verbalized understanding of these instructions.      FOLLOW-UP Return in about 3 weeks (around 03/28/2017) for follow up INR.   Gwenlyn Perking, PharmD PGY1 Pharmacy Resident Pager: [phone] 03/07/2017 3:10 PM

## 2017-03-07 NOTE — Progress Notes (Signed)
Carelink Summary Report / Loop Recorder 

## 2017-03-07 NOTE — Patient Instructions (Signed)
Patient instructed to take medications as defined in the Anti-coagulation Track section of this encounter.  Patient instructed to take today's dose. Continue current regimen of 1 tablet (5mg ) daily except 1/2 tablet (2.5mg ) on Mondays, Wednesdays, and Fridays Patient verbalized understanding of these instructions.

## 2017-03-08 MED ORDER — PRAVASTATIN SODIUM 10 MG PO TABS: 10.0000 mg | ORAL_TABLET | Freq: Every day | ORAL | 3 refills | Status: DC

## 2017-03-10 ENCOUNTER — Telehealth: Payer: Self-pay | Admitting: *Deleted

## 2017-03-10 NOTE — Progress Notes (Signed)
I reviewed the anticoagulation note.  Patient INR was at goal.  He is on [facility] for h/o VTE.

## 2017-03-12 ENCOUNTER — Emergency Department (HOSPITAL_COMMUNITY)
Admission: EM | Admit: 2017-03-12 | Discharge: 2017-03-12 | Disposition: A | Discharge status: Home / Self Care | Payer: Medicare HMO | Attending: Emergency Medicine | Admitting: Emergency Medicine

## 2017-03-12 ENCOUNTER — Encounter (HOSPITAL_COMMUNITY): Payer: Self-pay | Admitting: *Deleted

## 2017-03-12 DIAGNOSIS — M545 Low back pain, unspecified: Secondary | ICD-10-CM

## 2017-03-12 DIAGNOSIS — I2510 Atherosclerotic heart disease of native coronary artery without angina pectoris: Secondary | ICD-10-CM | POA: Diagnosis not present

## 2017-03-12 DIAGNOSIS — Z8546 Personal history of malignant neoplasm of prostate: Secondary | ICD-10-CM | POA: Insufficient documentation

## 2017-03-12 DIAGNOSIS — I5022 Chronic systolic (congestive) heart failure: Secondary | ICD-10-CM | POA: Diagnosis not present

## 2017-03-12 DIAGNOSIS — Z79899 Other long term (current) drug therapy: Secondary | ICD-10-CM | POA: Diagnosis not present

## 2017-03-12 DIAGNOSIS — I110 Hypertensive heart disease with heart failure: Secondary | ICD-10-CM | POA: Diagnosis not present

## 2017-03-12 DIAGNOSIS — R4182 Altered mental status, unspecified: Secondary | ICD-10-CM | POA: Insufficient documentation

## 2017-03-12 LAB — COMPREHENSIVE METABOLIC PANEL
ALK PHOS: 33 U/L — AB (ref 38–126)
ALT: <5 U/L — ABNORMAL LOW (ref 17–63)
ANION GAP: 7 (ref 5–15)
AST: 29 U/L (ref 15–41)
Albumin: 3.6 g/dL (ref 3.5–5.0)
BUN: 12 mg/dL (ref 6–20)
CALCIUM: 9.1 mg/dL (ref 8.9–10.3)
CO2: 28 mmol/L (ref 22–32)
Chloride: 108 mmol/L (ref 101–111)
Creatinine, Ser: 1.02 mg/dL (ref 0.61–1.24)
GFR calc non Af Amer: >60 mL/min (ref >60)
Glucose, Bld: 104 mg/dL — ABNORMAL HIGH (ref 65–99)
Potassium: 4.1 mmol/L (ref 3.5–5.1)
SODIUM: 143 mmol/L (ref 135–145)
TOTAL PROTEIN: 6.8 g/dL (ref 6.5–8.1)
Total Bilirubin: 0.7 mg/dL (ref 0.3–1.2)

## 2017-03-12 LAB — CBC WITH DIFFERENTIAL/PLATELET
BASOS ABS: 0 10*3/uL (ref 0.0–0.1)
Basophils Relative: 1 %
Eosinophils Absolute: 0.1 10*3/uL (ref 0.0–0.7)
Eosinophils Relative: 4 %
HEMATOCRIT: 40 % (ref 39.0–52.0)
Hemoglobin: 12.6 g/dL — ABNORMAL LOW (ref 13.0–17.0)
Lymphocytes Relative: 39 %
Lymphs Abs: 1.2 10*3/uL (ref 0.7–4.0)
MCH: 30.7 pg (ref 26.0–34.0)
MCHC: 31.5 g/dL (ref 30.0–36.0)
MCV: 97.6 fL (ref 78.0–100.0)
Monocytes Absolute: 0.2 10*3/uL (ref 0.1–1.0)
Monocytes Relative: 7 %
NEUTROS ABS: 1.5 10*3/uL — AB (ref 1.7–7.7)
Neutrophils Relative %: 49 %
Platelets: 158 10*3/uL (ref 150–400)
RBC: 4.1 MIL/uL — AB (ref 4.22–5.81)
RDW: 16.1 % — ABNORMAL HIGH (ref 11.5–15.5)
WBC: 3 10*3/uL — AB (ref 4.0–10.5)

## 2017-03-12 LAB — URINALYSIS, ROUTINE W REFLEX MICROSCOPIC
Bacteria, UA: NONE SEEN
Bilirubin Urine: NEGATIVE
Glucose, UA: NEGATIVE mg/dL
Hgb urine dipstick: NEGATIVE
Ketones, ur: NEGATIVE mg/dL
Leukocytes, UA: NEGATIVE
Nitrite: NEGATIVE
PROTEIN: NEGATIVE mg/dL
SQUAMOUS EPITHELIAL / LPF: NONE SEEN
Specific Gravity, Urine: 1.01 (ref 1.005–1.030)
pH: 7 (ref 5.0–8.0)

## 2017-03-12 MED ORDER — LACTATED RINGERS IV BOLUS (SEPSIS)
1000.0000 mL | Freq: Once | INTRAVENOUS | Status: AC
  Administered 2017-03-12: 1000 mL via INTRAVENOUS

## 2017-03-12 NOTE — ED Provider Notes (Signed)
[facility] Provider Note   CSN: [id number] Arrival date & time: 03/12/17  1209     History   Chief Complaint Chief Complaint  Patient presents with  . Altered Mental Status    HPI [name] [name] is a 83 y.o. male.  HPI  Patient has a history of waxing and waning mental status which is not changed. However he does have a history of urinary tract and sometimes he has not seen with back pain he has had back pain and difficulty urinating earlier today so came here for evaluation. Patient has no other complaints at his baseline mental status wise.  Past Medical History:  Diagnosis Date  . Anxiety   . Coronary artery disease, non-occlusive    with history of MI; Cath 2008 w multivessel nonobstructive CAD  . Dementia   . Depression   . GERD (gastroesophageal reflux disease)   . Hyperlipidemia   . Hypertension   . PE (pulmonary embolism)    unprovoked PE completed 6 months of warfarin, warfarin d/ced 02/12/2010, repeat PE 07/10/2010 after a long car ride and now on lifelong coumadin.  . Pituitary microadenoma ([place])    incidental finding CT 12/2009  . Prostate cancer ([place])   . Scoliosis   . 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 Bad odor of urine 01/17/2017  . Loss of weight 09/23/2016  . Long term current use of anticoagulant   . Right bundle branch block   . Hypokalemia 08/02/2016  . Diverticulosis 07/15/2016  . Pyelonephritis 07/09/2016  . Statin myopathy 03/15/2016  . Parkinsonian syndrome ([place]) 08/11/2015  . Normocytic anemia, not due to blood loss 04/16/2015  . Chronic systolic heart failure ([place]) 04/16/2015  . Falls frequently 04/16/2015  . Onychomycosis 04/15/2015  . CAD (coronary artery disease) 01/16/2015  . 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 Syncope 12/07/2014  . Healthcare maintenance 10/10/2014  . Lumbosacral spondylosis without myelopathy 09/24/2013  . Bradycardia 02/20/2013  . Generalized  ischemic cerebrovascular disease 02/05/2013  . Depression 01/25/2013  . Gross hematuria 01/19/2013  . Insomnia 01/20/2012  . AKI (acute kidney injury) ([place]) 11/08/2011  . Alzheimer's dementia 08/17/2011  . History of pulmonary embolism 09/18/2010  . Encounter for long-term use of antiplatelets/antithrombotics 09/18/2010  . DJD (degenerative joint disease) 08/06/2010  . ADENOCARCINOMA, PROSTATE, HX OF 08/06/2010  . HLD (hyperlipidemia) 10/15/2009  . Essential hypertension, benign 10/15/2009  . GERD 10/15/2009    Past Surgical History:  Procedure Laterality Date  . FLEXIBLE SIGMOIDOSCOPY N/A 08/02/2016   Procedure: FLEXIBLE SIGMOIDOSCOPY;  Surgeon: Ronald Lobo, MD;  Location: [facility] ENDOSCOPY;  Service: Endoscopy;  Laterality: N/A;  . FLEXIBLE SIGMOIDOSCOPY N/A 08/06/2016   Procedure: FLEXIBLE SIGMOIDOSCOPY;  Surgeon: Ronald Lobo, MD;  Location: [facility] ENDOSCOPY;  Service: Endoscopy;  Laterality: N/A;  . LEFT HEART CATHETERIZATION WITH CORONARY ANGIOGRAM N/A 12/10/2014   Procedure: LEFT HEART CATHETERIZATION WITH CORONARY ANGIOGRAM;  Surgeon: [name], MD;  Location: [facility] CATH LAB;  Service: Cardiovascular;  Laterality: N/A;  . LOOP RECORDER IMPLANT N/A 12/16/2014   Procedure: LOOP RECORDER IMPLANT;  Surgeon: [name], MD;  Location: [facility] CATH LAB;  Service: Cardiovascular;  Laterality: N/A;  . PROSTATECTOMY         Home Medications    Prior to Admission medications   Medication Sig Start Date End Date Taking? Authorizing Provider  acetaminophen (TYLENOL) 500 MG tablet Take 1,000 mg by mouth every 4 (four) hours as needed for mild  pain or headache.    Yes [provider]  amLODipine (NORVASC) 2.5 MG tablet Take 1 tablet (2.5 mg total) by mouth daily. 12/13/16  Yes [name], MD  b complex vitamins tablet Take 1 tablet by mouth every morning.    Yes [provider]  Ca Phosphate-Cholecalciferol (CALCIUM/VITAMIN D3 GUMMIES) 250-350 MG-UNIT CHEW Chew 1 Dose  by mouth 2 (two) times daily. 07/08/16  Yes [name], DO  carbidopa-levodopa (SINEMET IR) 25-100 MG tablet Take 1 tablet by mouth 4 (four) times daily. 02/22/17  Yes [name], DO  fish oil-omega-3 fatty acids 1000 MG capsule Take 2 g by mouth every morning.    Yes [provider]  memantine (NAMENDA XR) 28 MG CP24 24 hr capsule Take 1 capsule (28 mg total) by mouth every morning. 09/23/16  Yes Garvin Fila, MD  Multiple Vitamins-Minerals (MULTIVITAMIN WITH MINERALS) tablet Take 1 tablet by mouth every morning.    Yes [provider]  polyethylene glycol (MIRALAX / GLYCOLAX) packet Take 17 g by mouth daily as needed. Patient taking differently: Take 17 g by mouth daily as needed for mild constipation.  07/13/16  Yes Velna Ochs, MD  potassium chloride SA (KLOR-CON M20) 20 MEQ tablet Take 2 tablets (40 mEq total) by mouth daily. IM program 03/02/17  Yes [name], DO  pravastatin (PRAVACHOL) 10 MG tablet Take 1 tablet (10 mg total) by mouth daily. 03/08/17  Yes [name], DO  QUEtiapine (SEROQUEL) 25 MG tablet Take 1 tablet (25 mg total) by mouth at bedtime. 06/21/16  Yes Garvin Fila, MD  senna (SENOKOT) 8.6 MG TABS tablet Take 1 tablet (8.6 mg total) by mouth daily as needed for mild constipation. 07/13/16  Yes Velna Ochs, MD  vitamin C (ASCORBIC ACID) 500 MG tablet Take 1,000 mg by mouth every morning.    Yes [provider]  warfarin (COUMADIN) 5 MG tablet take 1/2 tablet by mouth on Mondays, Wednesdays and Fridays; all other days--take 1 tablet by mouth once-daily at [facility]. Patient taking differently: Take 2.5-5 mg by mouth See admin instructions. Take 1/2 tablet on Mon, Wed, and Fri. Take 1 tablet all other days 02/28/17  Yes [name], DO  aspirin 81 MG chewable tablet Chew 81 mg by mouth daily as needed (chest pain).     [provider]    Family History Family History  Problem Relation Age of Onset  . Hypertension  Father        Passed away from cerebral hemorrhage at age of 68.  [name] Dementia Mother        Passed away  . Hypertension Child        4 adult children  . Cervical cancer Other   . Lung cancer Other   . Stroke Other   . Heart attack Neg Hx     Social History Social History  Substance Use Topics  . Smoking status: Never Smoker  . Smokeless tobacco: Never Used  . Alcohol use No     Allergies   Aricept [donepezil hcl]; Other; and Shellfish-derived products   Review of Systems Review of Systems  Unable to perform ROS: Dementia     Physical Exam Updated Vital Signs BP (!) 166/87   Pulse (!) 50   Temp 98.4 F (36.9 C) (Oral)   Resp 15   SpO2 100%   Physical Exam  Constitutional: He is oriented to person, place, and time. He appears well-developed and  well-nourished.  HENT:  Head: Normocephalic and atraumatic.  Eyes: Conjunctivae and EOM are normal.  Neck: Normal range of motion.  Cardiovascular: Normal rate.   Pulmonary/Chest: Effort normal. No respiratory distress.  Abdominal: Soft. He exhibits no distension.  Musculoskeletal: Normal range of motion. He exhibits no edema or deformity.  Neurological: He is alert and oriented to person, place, and time.  Skin: Skin is warm and dry. No rash noted. No erythema.  Nursing note and vitals reviewed.    ED Treatments / Results  Labs (all labs ordered are listed, but only abnormal results are displayed) Labs Reviewed  URINALYSIS, ROUTINE W REFLEX MICROSCOPIC - Abnormal; Notable for the following:       Result Value   Color, Urine STRAW (*)    All other components within normal limits  CBC WITH DIFFERENTIAL/PLATELET - Abnormal; Notable for the following:    WBC 3.0 (*)    RBC 4.10 (*)    Hemoglobin 12.6 (*)    RDW 16.1 (*)    Neutro Abs 1.5 (*)    All other components within normal limits  COMPREHENSIVE METABOLIC PANEL - Abnormal; Notable for the following:    Glucose, Bld 104 (*)    ALT <5 (*)    Alkaline  Phosphatase 33 (*)    All other components within normal limits    EKG  EKG Interpretation None       Radiology No results found.  Procedures Procedures (including critical care time)  Medications Ordered in ED Medications  lactated ringers bolus 1,000 mL (0 mLs Intravenous Stopped 03/12/17 1557)    Initial Impression / Assessment and Plan / ED Course  I have reviewed the triage vital signs and the nursing notes.  Pertinent labs & imaging results that were available during my care of the patient were reviewed by me and considered in my medical decision making (see chart for details).  Workup negative. Baseline MS. Doubt AAA without any pulsatile mass, recent ct scan without e/o AAA. Hemodynamically stable otherwise. stble for dc to family care.   Final Clinical Impressions(s) / ED Diagnoses   Final diagnoses:  Acute bilateral low back pain without sciatica     Anamari Galeas, Corene Cornea, MD 03/13/17 1652

## 2017-03-12 NOTE — ED Triage Notes (Signed)
Pt to ED from home for eval of possible UTI. Pt having ams and lower back pain for the past couple of days. Pt family attempting to get urine sample at home but unable to do so. Brought to ED before pt gets worse.

## 2017-03-12 NOTE — ED Notes (Signed)
[place], daughter

## 2017-03-12 NOTE — ED Notes (Signed)
Dr. Dayna Barker at bedside at this time.

## 2017-03-15 LAB — CUP PACEART REMOTE DEVICE CHECK
Date Time Interrogation Session: 20180617063613
MDC IDC PG IMPLANT DT: 20160328

## 2017-04-05 ENCOUNTER — Ambulatory Visit (INDEPENDENT_AMBULATORY_CARE_PROVIDER_SITE_OTHER): Payer: Medicare HMO | Admitting: *Deleted

## 2017-04-05 ENCOUNTER — Encounter: Payer: Self-pay | Admitting: Neurology

## 2017-04-05 ENCOUNTER — Ambulatory Visit (INDEPENDENT_AMBULATORY_CARE_PROVIDER_SITE_OTHER): Payer: Medicare HMO | Admitting: Neurology

## 2017-04-05 VITALS — BP 137/88 | HR 73

## 2017-04-05 DIAGNOSIS — F01518 Vascular dementia, unspecified severity, with other behavioral disturbance: Secondary | ICD-10-CM

## 2017-04-05 DIAGNOSIS — F0151 Vascular dementia with behavioral disturbance: Secondary | ICD-10-CM | POA: Diagnosis not present

## 2017-04-05 DIAGNOSIS — R55 Syncope and collapse: Secondary | ICD-10-CM

## 2017-04-05 DIAGNOSIS — R451 Restlessness and agitation: Secondary | ICD-10-CM | POA: Diagnosis not present

## 2017-04-05 MED ORDER — QUETIAPINE FUMARATE 25 MG PO TABS: 25.0000 mg | ORAL_TABLET | Freq: Every day | ORAL | 4 refills | Status: DC

## 2017-04-05 NOTE — Progress Notes (Signed)
[name] DOB: 1931-05-05  REASON FOR VISIT: routine follow up for Alzheimer's and Parkinson's HISTORY FROM: patient  HISTORY OF PRESENT ILLNESS: UPDATE 01/23/14 (LL):  MMSE today is 23/30, AFT 9 and Clock drawing 1/4. He is tolerating Sinemet tid and Namenda well.  Mood is better on Zoloft. Having difficulty sleeping.  Does not nap during the day.  Has difficulty getting to sleep and wakes frequently.  Has tried Melatonin in the past without benefit.  He used Nortriptyline in the past for insomnia and pain, but at 100 mg dose became toxic (unresponsive).  Daughter notices "sundowning" at night, more confused in nighttime hours.   UPDATE 06/23/13 (LL): [name] [name] comes back for 2 month revisit. Last visit he was started on Sinemet to see if he would have any benefit. Both he and his daughter think that he is moving better since on it. Daughter states that he is no longer "freezing" when he tries to take a step. He is currently taking it twice a day, once when he gets up (around 7-8) and then again at 5 pm. He has no new complaints. MMSE today is 27/30, AFT 8 and Clock drawing 1/4.   UPDATE 05/24/13 (LL): Patient returns for followup. After starting Aricept at last visit daughter reports that he became very weak, confused, and had to be hospitalized. Was found to be bradycardic and symptomatic with heart rate in the 40s, attributable to the Aricept. Medication was stopped and patient was discharged to rehabilitation for short time and has since returned home. Daughter states that he has adjusted somewhat to having moved in with her, is taking Zoloft for depression, and has disinterest in getting out of the house much. States that sometimes it is hard for him to pick up his foot to take a step and it seems to be when he is nervous. Also seems to have tremor when nervous. Has had trouble with insomnia was started on melatonin 5 mg at bedtime with no response. Daughter feels like he has  improved some in his cognitive ability since last visit. MMSE last visit was 23/30. MMSE today is 25/30 with deficits in recall. Clock drawing 0/4. AFT 10. Geriatric depression score is 3 does not suggest depression.   PRIOR HPI 02/05/13 (PS): 80 male with remote right hemispheric TIA in May 2011 and mild cognitive impairment. He returns today for followup after his last visit with me on 01/05/2012. Continues to do well from a neurovascular standpoint and has not had a any further stroke or TIAs. He remains on warfarin and is tolerating it well without significant bleeding, bruising or the side effects. He states his blood pressure is under good control and it is 124/76 in office today. He however is had increasing confusion and memory difficulties for the last 1 year which have been progressive. He now needs help with taking his medications. He has stopped going out. He in fact wandered out a few weeks ago from his home and needed help to get back. He denies any agitation or caval changes. He has no significant hallucination or other lesions. His gait and balance are fine and there have been no safety issues. He has now moved and to live with his daughter for the last 2-3 months. The patient did undergo labs for treatable causes of cognitive impairment at last visit and vitamin B12, TSH and RPR were normal on 01/05/2012. His mother did have Alzheimer's.  Update 10/23/2014 : He returns for follow-up after  last visit 9 months ago. He is accompanied by his daughter who provides most of the history. Patient feels he is about the same and the daughter agrees. He continues to have poor short-term memory and needs help with activities like his medications and food. He can dress himself and uses a toilet on most occasions with only occasional help. He sometimes has trouble walking and states that his legs don't move but once he gets started he can walk fairly well. He walks with a stooped posture but his balance is not bad  and he is not had any recent falls. He remains on Namenda XR 28 mg daily which he is tolerating well. He had been on Aricept for years but developed slow heart rate requiring it to be stopped. He has had no interval health problems. He hasn't not been having any significant hallucinations, delusions, agitation or safety concerns. He does get anxious occasionally. The patient's other daughter wants him and his wife to come and stay with her for 3 months and the family is contemplating whether that would be a good move or not Update 04/23/2015 : He returns for follow-up after last visit 6 months ago. He is accompanied by his daughter. The patient was hospitalized in March with syncopal episodes with hypertension and low heart rate. His blood pressure medications were adjusted and tapered and lisinopril and Lasix were discontinued. He undergo a heart monitor for a few days. He is doing better now but blood pressure running in the 102H systolic range. However his been feeling weak all over as has not been walking a lot. He has not been using his walker. He feels his leg is slow and stiff and stick to the ground. He is walking difficulty to the more pronounced when is anxious. He has noticed previously some benefit after adding Sinemet which he takes 25/100 one tablet 3 times daily. Daughter has not noticed any significant further cognitive decline however on Mini-Mental status exam testing today she scored 17 out of 30 which is a significant drop from  22/30 at last visit. He remains on Namenda etc. and in the past has not been able to tolerate Aricept due to his heart rate being slow . Update 09/23/2015 : He returns for follow-up with his last visit with me 5 months ago. He is accompanied by his daughter who provides most of the history. Patient continues to have cognitive decline as well as problems with gait and balance. He remains on Sinemet which he takes in the morning when he gets up as well as close to bedtime.  His daughter feels that towards the evening time his legs dragging and he is unable to move them as well and perhaps he should take the evening dose earlier. He is also had increasing agitation at night and not being able to sleep and he does Nap during the day a lot. He was unable to afford Namenda etc. due to being in the doughnut hole and when he was switched to generic Namenda 10 mg twice daily he seems not to have done as well. On Mini-Mental status score testing today score 9/30 which is a significant decline from 17/30 at last visit. He was seen by Dr. 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 The patient was tried on trazodone but it made him too sleepy during the day and he took only 1 dose. Patient  has recently started home physical therapy. The patient's daughter seems extremely committed to keeping him at home and is looking at options to hire a caregiver Update 03/24/2016 ; he returns for follow-up after last visit 6 months ago. He is accompanied by his son and 2 daughters. Patient congested do poorly and family has noticed worsening of his gait and balance. His shuffling a lot more now and needs a walker and close supervision. He is had a few falls but fortunately not hurt himself. Patient had agitation at night and family had called earlier and hadn't increased her dose of Seroquel to 25 mg at night which works off and on. The family does admit that his sleep-wake cycle may be off and he does sleep a lot during the day and then have trouble at night. Patient now has 24-hour care at home. He was hospitalized in March this year with intestinal obstruction and following surgery is still not having regular bowel movements. The family is applied for more nursing help at home and are requesting a letter of medical necessity from me. He does get confused more frequently now  particularly at night that is sundowning. There have been no definite no delusions or hallucinations  or unsafe behavior Update 09/23/2016 : He returns for follow-up after last 6 months ago. He is had significant general medical decline in his condition. He has been hospitalized multiple times with urine tract infection as well as sigmoid volvulus and pulmonary embolism as well as hypokalemia. He requires essential. Would at home. He continues to stay at home with his daughter. He has nursing help during the day while the daughter is working but that night the daughter manages by herself. She has some minimum support from her brother as well as start her. Patient was switched to Namenda 10 mg twice daily upon request from the daughters should he was in the doughnut hole and could not afford it but daughter feels that Namenda etc. work better and she wants him to be switch back to it. Patient didn't not take Neupro patch which I ordered at last visit as he could not afford it. He is on Sinemet immediate release 1 tablet 4 times daily. He sleeps through the night and Seroquel at night seems to help his agitation and sleep as well. There have been no falls or injuries. Update 04/05/2017 : He returns for follow-up after last visit 6 months ago. He is accompanied by his daughter. The patient seems to be doing slightly better after starting Namenda 5 mg twice daily which is tolerating well. Is living at home with his daughter. He is able to walk indoors using a walker but does use a wheelchair for long distances. He still needs a two-person assist when is walking with one person standing in front of him and one behind them. He has a tendency to fall but has fortunately not had any falls in the last 6 months. He does have good days and bad days and is usually slow to respond to questions and speaks in a soft voice. He does get agitated intermittently but can be redirected when spoken to. He also has visual hallucinations  and sees things which are not that but can be reasoned with. He does take several core 25 mg at night and sleeps quite well. He does take Sinemet which seems to help with his drooling and mobility. He has no new complaints. REVIEW OF SYSTEMS: Full 14 system review of systems performed and notable only  for:  Memory loss, anxiety, nervousness, neck pain, sleep talking, eye discharge gait difficulty agitation and all other systems negative   ALLERGIES: Allergies  Allergen Reactions  . Aricept [Donepezil Hcl] Other (See Comments)    Symptomatic Bradycardia.  . Other Other (See Comments)    Shrimp gives him gout  . Shellfish-Derived Products Other (See Comments)    Causes a flare up with Gout    HOME MEDICATIONS: Outpatient Medications Prior to Visit  Medication Sig Dispense Refill  . acetaminophen (TYLENOL) 500 MG tablet Take 1,000 mg by mouth every 4 (four) hours as needed for mild pain or headache.     [name] amLODipine (NORVASC) 2.5 MG tablet Take 1 tablet (2.5 mg total) by mouth daily. 90 tablet 3  . aspirin 81 MG chewable tablet Chew 81 mg by mouth daily as needed (chest pain).     [name] b complex vitamins tablet Take 1 tablet by mouth every morning.     . Ca Phosphate-Cholecalciferol (CALCIUM/VITAMIN D3 GUMMIES) 250-350 MG-UNIT CHEW Chew 1 Dose by mouth 2 (two) times daily. 180 tablet 3  . carbidopa-levodopa (SINEMET IR) 25-100 MG tablet Take 1 tablet by mouth 4 (four) times daily. 120 tablet 2  . fish oil-omega-3 fatty acids 1000 MG capsule Take 2 g by mouth every morning.     . memantine (NAMENDA XR) 28 MG CP24 24 hr capsule Take 1 capsule (28 mg total) by mouth every morning. 30 capsule 6  . Multiple Vitamins-Minerals (MULTIVITAMIN WITH MINERALS) tablet Take 1 tablet by mouth every morning.     . polyethylene glycol (MIRALAX / GLYCOLAX) packet Take 17 g by mouth daily as needed. (Patient taking differently: Take 17 g by mouth daily as needed for mild constipation. ) 14 each 1  . potassium  chloride SA (KLOR-CON M20) 20 MEQ tablet Take 2 tablets (40 mEq total) by mouth daily. IM program 60 tablet 2  . pravastatin (PRAVACHOL) 10 MG tablet Take 1 tablet (10 mg total) by mouth daily. 90 tablet 3  . senna (SENOKOT) 8.6 MG TABS tablet Take 1 tablet (8.6 mg total) by mouth daily as needed for mild constipation. 120 each 0  . vitamin C (ASCORBIC ACID) 500 MG tablet Take 1,000 mg by mouth every morning.     . warfarin (COUMADIN) 5 MG tablet take 1/2 tablet by mouth on Mondays, Wednesdays and Fridays; all other days--take 1 tablet by mouth once-daily at 6PM. (Patient taking differently: Take 2.5-5 mg by mouth See admin instructions. Take 1/2 tablet on Mon, Wed, and Fri. Take 1 tablet all other days) 24 tablet 2  . QUEtiapine (SEROQUEL) 25 MG tablet Take 1 tablet (25 mg total) by mouth at bedtime. 90 tablet 4   No facility-administered medications prior to visit.    PHYSICAL EXAM  Vitals:   04/05/17 1434  BP: 137/88  Pulse: 73   There is no height or weight on file to calculate BMI.  Generalized: In no acute distress, pleasant  Frail elderly AA male,    Neck: Supple, no carotid bruits  Cardiac: Irregular rate and rhythm, soft 1/6 murmur  Pulmonary: Clear to auscultation bilaterally  Musculoskeletal: mild kyphosis   Neurological examination  MMSE history: 09/23/15 MMSE 9/30 04/23/2015 : MMSE 17/30 10/23/14 MMSE 22/30, AFT 11, Clock 1/4, GDS 5 01/23/14:   MMSE 23/30, recall 1/3, AFT 9 and Clock Drawing 1/4. 06/23/13:  MMSE 27/30, recall 3/3, AFT 8 and Clock drawing 1/4. GDS 5. 02/05/13:  MMSE 25/30, recall 1/3, AFT 10,  Clock drawing 0/4. GDS 3.  Mentation: Alert, language fluent.Speech hypophonic and nonfluent Mini-Mental status exam  Not done today. Deficits in orientation, attention, recall and following three-step commands. Animal naming unable. Clock drawing  unable. Voice is very soft and can barely be heard Mood and affect appropriate. Last traces. Positive glabellar tap. Grasp reflex  negative.  Cranial nerve II-XII: Pupils were equal round reactive to light extraocular movements were full, visual field were full on confrontational test. facial sensation and strength were normal. hearing was intact to finger rubbing bilaterally. Uvula tongue midline.   MOTOR: normal bulk and tone, full strength in the BUE, BLE, fine finger movements normal, no pronator drift. MILD COGWHEEL RIGIDIITY IN ARMS AND WRISTS, L>R.  SENSORY: normal and symmetric to light touch  COORDINATION: Normal finger-nose-finger, heel-to-shin bilaterally.  REFLEXES: 1+ and symmetric.  [name]  GAIT/STATION: Deferred as patient did not bring his walker   ASSESSMENT AND PLAN 83 year old AA male patient with   moderate dementia. 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 Remote history of right brain TIA in May 2011 and stable from a neurovascular standpoint.  Vascular Parkinsonism  . PLAN:  I had a long discussion with the patient with regards to his vascular dementia, parkinsonism and remote stroke and answered questions. Continue Sinemet in the current dose of 25/100 mg 1 tablet 4 times a day. Continue Namenda XR 28 mg daily for dementia. Continue Seroquel 25 mg at night for sleep and patient may use 12.5 mg dose during the daytime for agitation/hallucinations if necessary. Continue to use a walker and close supervision while walking to avoid falls and injuries. Continue Coumadin for stroke prevention  Given h/o pulmonary embolism and strict control of hypertension with blood pressure goal below 130/90. Return for follow-up in the future in a year or call earlier if necessary Greater than 50% time during this 25 minute visit was spent on counseling and coordination of care about his dementia, parkinsonism and stroke    [name], MD  04/05/2017, 4:50 PM [facility] [address] [place], [place] [address] [phone]  Note: This document was prepared with digital dictation and possible  smart phrase technology. Any transcriptional errors that result from this process are unintentional.

## 2017-04-05 NOTE — Patient Instructions (Addendum)
I had a long discussion with the patient with regards to his vascular dementia, parkinsonism and remote stroke and answered questions. Continue Sinemet in the current dose of 25/100 mg 1 tablet 4 times a day. Continue Namenda XR 28 mg daily for dementia. Continue Seroquel 25 mg at night for sleep and patient may use 12.5 mg dose during the daytime for agitation/hallucinations if necessary. Continue to use a walker and close supervision while walking to avoid falls and injuries. Continue Coumadin for stroke prevention  Given h/o pulmonary embolism and strict control of hypertension with blood pressure goal below 130/90. Return for follow-up in the future in a year or call earlier if necessary

## 2017-04-05 NOTE — Progress Notes (Signed)
Carelink Summary Report / Loop Recorder 

## 2017-04-19 LAB — CUP PACEART REMOTE DEVICE CHECK
Date Time Interrogation Session: 20180717142735
Implantable Pulse Generator Implant Date: 20160328

## 2017-04-19 NOTE — Progress Notes (Signed)
Carelink summary report received. Battery status OK. Normal device function. No new symptom episodes, tachy episodes, brady, or pause episodes. No new AF episodes. Monthly summary reports and ROV/PRN 

## 2017-04-23 ENCOUNTER — Telehealth: Payer: Self-pay | Admitting: Neurology

## 2017-04-25 ENCOUNTER — Other Ambulatory Visit: Payer: Self-pay

## 2017-04-25 MED ORDER — MEMANTINE HCL ER 28 MG PO CP24: 28.0000 mg | ORAL_CAPSULE | ORAL | 3 refills | Status: DC

## 2017-05-02 ENCOUNTER — Ambulatory Visit (INDEPENDENT_AMBULATORY_CARE_PROVIDER_SITE_OTHER): Payer: Medicare HMO | Admitting: Pharmacist

## 2017-05-02 DIAGNOSIS — Z7902 Long term (current) use of antithrombotics/antiplatelets: Secondary | ICD-10-CM

## 2017-05-02 DIAGNOSIS — Z86711 Personal history of pulmonary embolism: Secondary | ICD-10-CM

## 2017-05-02 LAB — POCT INR: INR: 2.7

## 2017-05-02 NOTE — Progress Notes (Signed)
INTERNAL MEDICINE TEACHING ATTENDING ADDENDUM - [name], DO Duration- indefinate, Indication- VTE, INR-  Lab Results  Component Value Date   INR 2.70 05/02/2017  . Agree with pharmacy recommendations as outlined in their note.

## 2017-05-02 NOTE — Progress Notes (Signed)
[facility] Anticoagulation Management [name] [name] is a 83 y.o. male who reports to the clinic for monitoring of warfarin treatment.    Indication: PE , history of; long-term use of anticoagulants. Duration: indefinite Supervising physician: Joni Reining  Anticoagulation Clinic Visit History: Patient does not report signs/symptoms of bleeding or thromboembolism  Other recent changes: No diet, medications, lifestyle endorsed by his daughter to me.  Anticoagulation Episode Summary    Current INR goal:   2.0-3.0  TTR:   73.0 % (6.3 y)  Next INR check:   05/30/2017  INR from last check:   2.70 (05/02/2017)  Weekly max warfarin dose:     Target end date:   Indefinite  INR check location:   [facility]  Preferred lab:     Send INR reminders to:   [facility]   Indications   History of pulmonary embolism [Z86.711] Encounter for long-term use of antiplatelets/antithrombotics [Z79.02]       Comments:   History of multiple venous embolic episodes. Will continue to annual re-evaluate continued need for warfarin weighing risks vs. benefits.       Anticoagulation Care Providers    Provider Role Specialty Phone number   Bertha Stakes, MD  Internal Medicine [phone]      Allergies  Allergen Reactions  . Aricept [Donepezil Hcl] Other (See Comments)    Symptomatic Bradycardia.  . Other Other (See Comments)    Shrimp gives him gout  . Shellfish-Derived Products Other (See Comments)    Causes a flare up with Gout   Prior to Admission medications   Medication Sig Start Date End Date Taking? Authorizing Provider  acetaminophen (TYLENOL) 500 MG tablet Take 1,000 mg by mouth every 4 (four) hours as needed for mild pain or headache.    Yes [provider]  amLODipine (NORVASC) 2.5 MG tablet Take 1 tablet (2.5 mg total) by mouth daily. 12/13/16  Yes [name], MD  aspirin 81 MG chewable tablet Chew 81 mg by mouth daily as needed (chest pain).    Yes [provider]   b complex vitamins tablet Take 1 tablet by mouth every morning.    Yes [provider]  Ca Phosphate-Cholecalciferol (CALCIUM/VITAMIN D3 GUMMIES) 250-350 MG-UNIT CHEW Chew 1 Dose by mouth 2 (two) times daily. 07/08/16  Yes [name], DO  carbidopa-levodopa (SINEMET IR) 25-100 MG tablet Take 1 tablet by mouth 4 (four) times daily. 02/22/17  Yes [name], DO  fish oil-omega-3 fatty acids 1000 MG capsule Take 2 g by mouth every morning.    Yes [provider]  memantine (NAMENDA XR) 28 MG CP24 24 hr capsule Take 1 capsule (28 mg total) by mouth every morning. 04/25/17  Yes Garvin Fila, MD  Multiple Vitamins-Minerals (MULTIVITAMIN WITH MINERALS) tablet Take 1 tablet by mouth every morning.    Yes [provider]  polyethylene glycol (MIRALAX / GLYCOLAX) packet Take 17 g by mouth daily as needed. Patient taking differently: Take 17 g by mouth daily as needed for mild constipation.  07/13/16  Yes Velna Ochs, MD  potassium chloride SA (KLOR-CON M20) 20 MEQ tablet Take 2 tablets (40 mEq total) by mouth daily. IM program 03/02/17  Yes [name], DO  pravastatin (PRAVACHOL) 10 MG tablet Take 1 tablet (10 mg total) by mouth daily. 03/08/17  Yes [name], DO  QUEtiapine (SEROQUEL) 25 MG tablet Take 1 tablet (25 mg total) by mouth at bedtime. May use half a tablet 12.5 mg  during the daytime as needed for agitation/hallucinations 04/05/17  Yes Garvin Fila, MD  senna (SENOKOT) 8.6 MG TABS tablet Take 1 tablet (8.6 mg total) by mouth daily as needed for mild constipation. 07/13/16  Yes Velna Ochs, MD  vitamin C (ASCORBIC ACID) 500 MG tablet Take 1,000 mg by mouth every morning.    Yes [provider]  warfarin (COUMADIN) 5 MG tablet take 1/2 tablet by mouth on Mondays, Wednesdays and Fridays; all other days--take 1 tablet by mouth once-daily at [facility]. Patient taking differently: Take 2.5-5 mg by mouth See admin instructions. Take 1/2 tablet  on Mon, Wed, and Fri. Take 1 tablet all other days 02/28/17  Yes [name], DO   Past Medical History:  Diagnosis Date  . Anxiety   . Coronary artery disease, non-occlusive    with history of MI; Cath 2008 w multivessel nonobstructive CAD  . Dementia   . Depression   . GERD (gastroesophageal reflux disease)   . Hyperlipidemia   . Hypertension   . Memory loss   . Parkinson disease ([place])   . PE (pulmonary embolism)    unprovoked PE completed 6 months of warfarin, warfarin d/ced 02/12/2010, repeat PE 07/10/2010 after a long car ride and now on lifelong coumadin.  . Pituitary microadenoma ([place])    incidental finding CT 12/2009  . Prostate cancer ([place])   . Scoliosis   . 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 Marital status: Married    Spouse name: N/A  . Number of children: 4  . Years of education: master's   Occupational History  . Retired Retired   Social History Main Topics  . Smoking status: Never Smoker  . Smokeless tobacco: Never Used  . Alcohol use No  . Drug use: No  . Sexual activity: No   Other Topics Concern  . Not on file   Social History Narrative   Lives with daughter. Wife also has severe dementia.    Drinks 1 cup of coffee a day    Family History  Problem Relation Age of Onset  . Hypertension Father        Passed away from cerebral hemorrhage at age of 55.  [name] Dementia Mother        Passed away  . Hypertension Child        4 adult children  . Cervical cancer Other   . Lung cancer Other   . Stroke Other   . Heart attack Neg Hx     ASSESSMENT Recent Results: The most recent result is correlated with 27.5 mg per week: Lab Results  Component Value Date   INR 2.70 05/02/2017   INR 2.9 03/07/2017   INR 2.9 02/17/2017    Anticoagulation Dosing: INR as of 05/02/2017 and Previous Warfarin Dosing Information    INR Dt INR Goal [employer] Sun Mon Tue Wed Thu Fri Sat   05/02/2017 2.70 2.0-3.0 27.5 mg 5 mg 2.5 mg 5  mg 2.5 mg 5 mg 2.5 mg 5 mg    Previous description   Give 1/2 tablet by mouth on Mondays, Wednesdays and Fridays; all other days--give 1 tablet by mouth once-daily at 6PM.    Anticoagulation Warfarin Dose Instructions as of 05/02/2017      Total Sun Mon Tue Wed Thu Fri Sat   New Dose 27.5 mg 5 mg 2.5 mg 5 mg 2.5 mg 5 mg 2.5 mg 5 mg     (5  mg x 1)  (5 mg x 0.5)  (5 mg x 1)  (5 mg x 0.5)  (5 mg x 1)  (5 mg x 0.5)  (5 mg x 1)                         Description   Give 1/2 tablet by mouth on Mondays, Wednesdays and Fridays; all other days--give 1 tablet by mouth once-daily at 6PM.      INR today: Therapeutic  PLAN Weekly dose was unchanged.  Patient Instructions  Daughter instructed to give medications as defined in the Anti-coagulation Track section of this encounter.  Daughter instructed to give today's dose.  Daughter instructed to give 1/2 tablet by mouth on Mondays, Wednesdays and Fridays; all other days--give 1 tablet by mouth once-daily at [facility]. Daughter verbalized understanding of these instructions.     Patient advised to contact clinic or seek medical attention if signs/symptoms of bleeding or thromboembolism occur.  Patient verbalized understanding by repeating back information and was advised to contact me if further medication-related questions arise. Patient was also provided an information handout.  Follow-up Return in 4 weeks (on 05/30/2017) for Follow up INR at 3PM.  Ceasar Lund, Eustaquio Boyden PharmD, CACP, CPP  15 minutes spent face-to-face with the patient during the encounter. 50% of time spent on education. 50% of time was spent on fingerstick point of care INR sample collection, processing, results interpretation and data-entry in to [email or web address] .

## 2017-05-02 NOTE — Patient Instructions (Signed)
Daughter instructed to give medications as defined in the Anti-coagulation Track section of this encounter.  Daughter instructed to give today's dose.  Daughter instructed to give 1/2 tablet by mouth on Mondays, Wednesdays and Fridays; all other days--give 1 tablet by mouth once-daily at [facility] Daughter verbalized understanding of these instructions.

## 2017-05-05 ENCOUNTER — Ambulatory Visit (INDEPENDENT_AMBULATORY_CARE_PROVIDER_SITE_OTHER): Payer: Medicare HMO | Admitting: *Deleted

## 2017-05-05 DIAGNOSIS — R55 Syncope and collapse: Secondary | ICD-10-CM | POA: Diagnosis not present

## 2017-05-06 ENCOUNTER — Ambulatory Visit (INDEPENDENT_AMBULATORY_CARE_PROVIDER_SITE_OTHER): Payer: Medicare HMO | Admitting: Internal Medicine

## 2017-05-06 DIAGNOSIS — Z79899 Other long term (current) drug therapy: Secondary | ICD-10-CM

## 2017-05-06 DIAGNOSIS — R4182 Altered mental status, unspecified: Secondary | ICD-10-CM

## 2017-05-06 DIAGNOSIS — F0391 Unspecified dementia with behavioral disturbance: Secondary | ICD-10-CM

## 2017-05-06 DIAGNOSIS — Z7901 Long term (current) use of anticoagulants: Secondary | ICD-10-CM | POA: Diagnosis not present

## 2017-05-06 DIAGNOSIS — Z818 Family history of other mental and behavioral disorders: Secondary | ICD-10-CM | POA: Diagnosis not present

## 2017-05-06 LAB — BASIC METABOLIC PANEL
Anion gap: 7 (ref 5–15)
BUN: 14 mg/dL (ref 6–20)
CALCIUM: 9.8 mg/dL (ref 8.9–10.3)
CO2: 28 mmol/L (ref 22–32)
CREATININE: 1.16 mg/dL (ref 0.61–1.24)
Chloride: 109 mmol/L (ref 101–111)
GFR calc Af Amer: >60 mL/min (ref >60)
GFR, EST NON AFRICAN AMERICAN: 56 mL/min — AB (ref >60)
GLUCOSE: 79 mg/dL (ref 65–99)
POTASSIUM: 4 mmol/L (ref 3.5–5.1)
SODIUM: 144 mmol/L (ref 135–145)

## 2017-05-06 LAB — CBC
HCT: 42.9 % (ref 39.0–52.0)
HEMOGLOBIN: 13.9 g/dL (ref 13.0–17.0)
MCH: 31.5 pg (ref 26.0–34.0)
MCHC: 32.4 g/dL (ref 30.0–36.0)
MCV: 97.3 fL (ref 78.0–100.0)
Platelets: 154 10*3/uL (ref 150–400)
RBC: 4.41 MIL/uL (ref 4.22–5.81)
RDW: 14.9 % (ref 11.5–15.5)
WBC: 4.7 10*3/uL (ref 4.0–10.5)

## 2017-05-06 LAB — POCT URINALYSIS DIPSTICK
Bilirubin, UA: NEGATIVE
Glucose, UA: NEGATIVE
KETONES UA: NEGATIVE
Nitrite, UA: NEGATIVE
SPEC GRAV UA: 1.02 (ref 1.010–1.025)
UROBILINOGEN UA: 0.2 U/dL
pH, UA: 7 (ref 5.0–8.0)

## 2017-05-06 LAB — TSH: TSH: 0.847 u[IU]/mL (ref 0.350–4.500)

## 2017-05-06 MED ORDER — SULFAMETHOXAZOLE-TRIMETHOPRIM 800-160 MG PO TABS: 1.0000 | ORAL_TABLET | Freq: Two times a day (BID) | ORAL | 0 refills | Status: DC

## 2017-05-06 NOTE — Patient Instructions (Signed)
It was nice to meet you today.  We will call you if results from your blood tests are abnormal.  Referral for home PT has been ordered.

## 2017-05-06 NOTE — Progress Notes (Signed)
Loop recorder summary report 

## 2017-05-07 NOTE — Assessment & Plan Note (Addendum)
Patient presenting for AMS x1 week. His daughter, who is his care taker, is present in the room and provides history as patient is unable to due to advanced dementia. Per daughter, patient has had a decline in physical activity over the past week. States that he is unable to walk with assistance or feed himself, which he can do at baseline. He usually alternates between good and bad days, but has been having bad days over the past week. He is able to voice needs at baseline and has not reported any complaints during this time. Daughter has not noticed any changes in mental status, only changes in behavior. States this has happened in the past and improved with home PT. He also has a history of altered mental status secondary to UTI, but daughter states he is not having similar symptoms such as foul-smelling urine and altered mentation. He is incontinent at baseline. No recent changes in medications. He was recently see by neurology on 03/2017 with no changes in management. Denies fever, chills, diaphoresis, chest pain, SOB, cough, abdominal pain, N/V, and changes in bowel movements.   - CBC, BMP, UA, TSH and CXR ordered to evaluate for anemia, electrolyte abnormalities, kidney function, UTI, and lung etiology such as PNA as the cause of patient's altered behavior.   CBC and BMP with no abnormalities. TSH nl. UA with large amount of leukocyte esterase, and no nitrites. Past 3 UAs with no LE. Given presence of LE with new altered behavior will treat for UTI at this time. Daughter would like to hold on CXR for now given UA findings which I agreed with. Will treat with antibiotic therapy for now and do CXR if no improvement in symptoms in 1 week.   - Bactrim DS 800-160mg  BID x7 days  - Given bactrim's interaction with warfarin will reduce warfarin dose by 25% as recommended by Dr. Beryle Beams. Patient will take 2.5mg  every day for 7 days and resume his regular regimen after this.  - Home health referral for PT  and RN. Patient will need INR check on Monday (8/20) given changes in warfarin dose in the setting of antibiotic therapy. Will call [facility] services on Monday (8/20) to inform them.

## 2017-05-07 NOTE — Progress Notes (Signed)
   CC: altered behavior   HPI:  [name] is a 83 y.o. male with PMH as described below who presents to clinic with care taker (daughter) for altered behavior. Please see problem based assessment and plan for further details.   Past Medical History:  Diagnosis Date  . Anxiety   . Coronary artery disease, non-occlusive    with history of MI; Cath 2008 w multivessel nonobstructive CAD  . Dementia   . Depression   . GERD (gastroesophageal reflux disease)   . Hyperlipidemia   . Hypertension   . Memory loss   . Parkinson disease ([place])   . PE (pulmonary embolism)    unprovoked PE completed 6 months of warfarin, warfarin d/ced 02/12/2010, repeat PE 07/10/2010 after a long car ride and now on lifelong coumadin.  . Pituitary microadenoma ([place])    incidental finding CT 12/2009  . Prostate cancer ([place])   . Scoliosis   . 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 Negative for chills, diaphoresis and fever.  Respiratory: Negative for cough, shortness of breath and wheezing.   Cardiovascular: Negative for chest pain and leg swelling.  Gastrointestinal: Negative for abdominal pain, blood in stool, constipation, diarrhea, nausea and vomiting.  Genitourinary: Negative for flank pain and hematuria.  Skin: Negative for itching and rash.  Neurological: Negative for speech change, focal weakness and weakness.    Physical Exam:  Vitals:   05/06/17 1505  BP: (!) 145/85  Pulse: 63  Temp: 98.4 F (36.9 C)  TempSrc: Oral  SpO2: 99%  Height: 5' 7.5" (1.715 m)   General: pleasant male, well-nourished, well-developed, in NAD  HENT: NCAT, neck supple and FROM, MMM, OP clear without exudates or erythema Eyes: anicteric sclera, PERRL Cardiac: regular rate and rhythm, nl S1/S2, no murmurs, rubs or gallops  Pulm: difficult exam due to poor patient effort but no wheezes or crackles noted, no increased work of  breathing  Abd: soft, NTND, bowel sounds present  Neuro: A&Ox1, difficult exam due to patient's inability to follow most commands, but able to move all 4 extremities, no facial droop noted   Ext: warm and well perfused, no peripheral edema Derm: no rashes noted   Psych: patient has advanced dementia and answers questions inappropriately, believs he is at his wood workshop and constantly asking for power tools throughout interview    Assessment & Plan:   See Encounters Tab for problem based charting.  Patient seen with Dr. Lynnae January

## 2017-05-09 NOTE — Addendum Note (Signed)
Addended by: Welford Roche on: 05/09/2017 01:31 PM   Modules accepted: Orders

## 2017-05-10 DIAGNOSIS — I5022 Chronic systolic (congestive) heart failure: Secondary | ICD-10-CM | POA: Diagnosis not present

## 2017-05-10 DIAGNOSIS — R4182 Altered mental status, unspecified: Secondary | ICD-10-CM | POA: Diagnosis not present

## 2017-05-10 DIAGNOSIS — G309 Alzheimer's disease, unspecified: Secondary | ICD-10-CM | POA: Diagnosis not present

## 2017-05-10 DIAGNOSIS — F0280 Dementia in other diseases classified elsewhere without behavioral disturbance: Secondary | ICD-10-CM | POA: Diagnosis not present

## 2017-05-10 DIAGNOSIS — I110 Hypertensive heart disease with heart failure: Secondary | ICD-10-CM | POA: Diagnosis not present

## 2017-05-10 DIAGNOSIS — G20 Parkinson's disease: Secondary | ICD-10-CM | POA: Diagnosis not present

## 2017-05-10 NOTE — Telephone Encounter (Signed)
Ph for JQB [phone]

## 2017-05-10 NOTE — Telephone Encounter (Signed)
Dr Elie Confer is responding

## 2017-05-10 NOTE — Telephone Encounter (Signed)
Pt's daughter would also like to be advised, triage will call her when answer is given

## 2017-05-10 NOTE — Telephone Encounter (Signed)
[facility] shriddha for [facility] calls to give INR results from this am: INR= 3.5 Please advise

## 2017-05-10 NOTE — Progress Notes (Signed)
Internal Medicine Clinic Attending  I saw and evaluated the patient.  I personally confirmed the key portions of the history and exam documented by Dr. Santos-Sanchez and I reviewed pertinent patient test results.  The assessment, diagnosis, and plan were formulated together and I agree with the documentation in the resident's note. 

## 2017-05-10 NOTE — Telephone Encounter (Signed)
Sorry for the delay in getting to it. I will sign off since Paulla Dolly is on the case.  Thank you Bonnita Nasuti!

## 2017-05-11 ENCOUNTER — Telehealth: Payer: Self-pay | Admitting: *Deleted

## 2017-05-11 ENCOUNTER — Telehealth: Payer: Self-pay | Admitting: Pharmacist

## 2017-05-11 ENCOUNTER — Ambulatory Visit: Payer: Medicare HMO | Admitting: Podiatry

## 2017-05-11 DIAGNOSIS — I5022 Chronic systolic (congestive) heart failure: Secondary | ICD-10-CM | POA: Diagnosis not present

## 2017-05-11 DIAGNOSIS — I110 Hypertensive heart disease with heart failure: Secondary | ICD-10-CM | POA: Diagnosis not present

## 2017-05-11 DIAGNOSIS — G309 Alzheimer's disease, unspecified: Secondary | ICD-10-CM | POA: Diagnosis not present

## 2017-05-11 DIAGNOSIS — F0280 Dementia in other diseases classified elsewhere without behavioral disturbance: Secondary | ICD-10-CM | POA: Diagnosis not present

## 2017-05-11 DIAGNOSIS — R4182 Altered mental status, unspecified: Secondary | ICD-10-CM | POA: Diagnosis not present

## 2017-05-11 DIAGNOSIS — G20 Parkinson's disease: Secondary | ICD-10-CM | POA: Diagnosis not present

## 2017-05-11 NOTE — Telephone Encounter (Signed)
Further reduction (empirically) will be required in warfarin dosing. Patient had been empirically adjusted down by Dr. Laurin Coder with consultation to Dr. Beryle Beams to 2.5mg  warfarin per day at time of visit in which SMX-TMP was prescribed for 7 days. Home PT/INR by [facility] collected on 20-AUG-18 was 3.5 based upon that dosing strategy. Given that it was 3.5 on 20-AUG-18 additional empiric downward dosing is required. I suggest ZERO (0)mg on Wednesday 22-AUG-18; 2.5mg  (1/2 of his 5mg  warfarin tablet on Thursday 23-AUG-18; ZERO (0)mg on Friday 24-AUG-18; 2.5mg  (1/2 of his 5mg  warfarin tablet on Saturday 25-AUG-18; ZERO (0)mg on Sunday 26-AUG-18. INR must be re-collected on Monday 27-AUG-18 by [facility] OR by patient coming to [facility] (based upon his ability to get here).

## 2017-05-11 NOTE — Telephone Encounter (Signed)
No answer. Calling patient's daughter to give new warfarin dosing instructions based upon [place] home-bound FS PT/INR collected and reported to Triage Nurse--who apprised me yesterday. Results = 3.5 on 27.5mg  warfarin/per week. Will reduce to 22.5mg /wk. Will keep calling daughter.

## 2017-05-11 NOTE — Telephone Encounter (Signed)
Agree 

## 2017-05-11 NOTE — Telephone Encounter (Signed)
[facility] PT calls VO given for 3xweek for 4 weeks for rigidity issues, transfer issues, strength issues PT states there may not be anything they can do to improve pt status Do you agree?

## 2017-05-11 NOTE — Telephone Encounter (Signed)
Have spoken to [facility] and andrea, [facility] repeated back instructions and ph# for dr groce

## 2017-05-13 LAB — CUP PACEART REMOTE DEVICE CHECK
Date Time Interrogation Session: 20180816144037
MDC IDC PG IMPLANT DT: 20160328

## 2017-05-16 ENCOUNTER — Telehealth: Payer: Self-pay | Admitting: Pharmacist

## 2017-05-16 DIAGNOSIS — I5022 Chronic systolic (congestive) heart failure: Secondary | ICD-10-CM | POA: Diagnosis not present

## 2017-05-16 DIAGNOSIS — G309 Alzheimer's disease, unspecified: Secondary | ICD-10-CM | POA: Diagnosis not present

## 2017-05-16 DIAGNOSIS — I110 Hypertensive heart disease with heart failure: Secondary | ICD-10-CM | POA: Diagnosis not present

## 2017-05-16 DIAGNOSIS — R4182 Altered mental status, unspecified: Secondary | ICD-10-CM | POA: Diagnosis not present

## 2017-05-16 DIAGNOSIS — G20 Parkinson's disease: Secondary | ICD-10-CM | POA: Diagnosis not present

## 2017-05-16 DIAGNOSIS — F0280 Dementia in other diseases classified elsewhere without behavioral disturbance: Secondary | ICD-10-CM | POA: Diagnosis not present

## 2017-05-16 NOTE — Telephone Encounter (Signed)
[facility] RN called with results of INR = 1.8 while taking warfarin 2.5mg  every other day (alternating with NO warfarin because of serious DDI with SMX-TMP). Dose was increased to 2.5mg  (1/2 of his 5mg  peach-colored warfarin tablet) daily. Conveyed information to the [facility] RN as well as the patient's daughter, [name].

## 2017-05-16 NOTE — Telephone Encounter (Signed)
Increase to 1/2 tablet of his 5mg  peach-colored warfarin tablets by mouth once-daily at [facility] each day. [facility] RN will re-collect FS POC INR on 5-SEP-18.

## 2017-05-16 NOTE — Telephone Encounter (Signed)
Called daughter and provided new dosing instructions (1/2 tablet of his 5mg  peach-colored warfarin tablets by mouth, once-daily at [facility]). [facility] RN to re-collect on Wednesday 5-SEP-18 and call to me

## 2017-05-18 DIAGNOSIS — I5022 Chronic systolic (congestive) heart failure: Secondary | ICD-10-CM | POA: Diagnosis not present

## 2017-05-18 DIAGNOSIS — I110 Hypertensive heart disease with heart failure: Secondary | ICD-10-CM | POA: Diagnosis not present

## 2017-05-18 DIAGNOSIS — F0280 Dementia in other diseases classified elsewhere without behavioral disturbance: Secondary | ICD-10-CM | POA: Diagnosis not present

## 2017-05-18 DIAGNOSIS — G309 Alzheimer's disease, unspecified: Secondary | ICD-10-CM | POA: Diagnosis not present

## 2017-05-18 DIAGNOSIS — R4182 Altered mental status, unspecified: Secondary | ICD-10-CM | POA: Diagnosis not present

## 2017-05-18 DIAGNOSIS — G20 Parkinson's disease: Secondary | ICD-10-CM | POA: Diagnosis not present

## 2017-05-20 DIAGNOSIS — G20 Parkinson's disease: Secondary | ICD-10-CM | POA: Diagnosis not present

## 2017-05-20 DIAGNOSIS — R4182 Altered mental status, unspecified: Secondary | ICD-10-CM | POA: Diagnosis not present

## 2017-05-20 DIAGNOSIS — I5022 Chronic systolic (congestive) heart failure: Secondary | ICD-10-CM | POA: Diagnosis not present

## 2017-05-20 DIAGNOSIS — I110 Hypertensive heart disease with heart failure: Secondary | ICD-10-CM | POA: Diagnosis not present

## 2017-05-20 DIAGNOSIS — F0280 Dementia in other diseases classified elsewhere without behavioral disturbance: Secondary | ICD-10-CM | POA: Diagnosis not present

## 2017-05-20 DIAGNOSIS — G309 Alzheimer's disease, unspecified: Secondary | ICD-10-CM | POA: Diagnosis not present

## 2017-05-23 DIAGNOSIS — G20 Parkinson's disease: Secondary | ICD-10-CM | POA: Diagnosis not present

## 2017-05-23 DIAGNOSIS — I5022 Chronic systolic (congestive) heart failure: Secondary | ICD-10-CM | POA: Diagnosis not present

## 2017-05-23 DIAGNOSIS — R4182 Altered mental status, unspecified: Secondary | ICD-10-CM | POA: Diagnosis not present

## 2017-05-23 DIAGNOSIS — F0280 Dementia in other diseases classified elsewhere without behavioral disturbance: Secondary | ICD-10-CM | POA: Diagnosis not present

## 2017-05-23 DIAGNOSIS — G309 Alzheimer's disease, unspecified: Secondary | ICD-10-CM | POA: Diagnosis not present

## 2017-05-23 DIAGNOSIS — I110 Hypertensive heart disease with heart failure: Secondary | ICD-10-CM | POA: Diagnosis not present

## 2017-05-25 ENCOUNTER — Other Ambulatory Visit: Payer: Self-pay | Admitting: *Deleted

## 2017-05-25 ENCOUNTER — Telehealth: Payer: Self-pay | Admitting: Pharmacist

## 2017-05-25 DIAGNOSIS — I110 Hypertensive heart disease with heart failure: Secondary | ICD-10-CM | POA: Diagnosis not present

## 2017-05-25 DIAGNOSIS — I5022 Chronic systolic (congestive) heart failure: Secondary | ICD-10-CM | POA: Diagnosis not present

## 2017-05-25 DIAGNOSIS — F0280 Dementia in other diseases classified elsewhere without behavioral disturbance: Secondary | ICD-10-CM | POA: Diagnosis not present

## 2017-05-25 DIAGNOSIS — G309 Alzheimer's disease, unspecified: Secondary | ICD-10-CM | POA: Diagnosis not present

## 2017-05-25 DIAGNOSIS — R4182 Altered mental status, unspecified: Secondary | ICD-10-CM | POA: Diagnosis not present

## 2017-05-25 DIAGNOSIS — G20 Parkinson's disease: Secondary | ICD-10-CM | POA: Diagnosis not present

## 2017-05-25 DIAGNOSIS — E876 Hypokalemia: Secondary | ICD-10-CM

## 2017-05-25 NOTE — Telephone Encounter (Signed)
INR 1.4 phoned to me by [facility] RN. Patient taking 17.5mg /week warfarin; increased to 22.5mg  per week warfarin. Recollect INR on 17-SEP-18.

## 2017-05-26 MED ORDER — POTASSIUM CHLORIDE CRYS ER 20 MEQ PO TBCR: 40.0000 meq | EXTENDED_RELEASE_TABLET | Freq: Every day | ORAL | 1 refills | Status: DC

## 2017-05-26 MED ORDER — WARFARIN SODIUM 5 MG PO TABS: 2.5000 mg | ORAL_TABLET | ORAL | 3 refills | Status: DC

## 2017-05-26 MED ORDER — CARBIDOPA-LEVODOPA 25-100 MG PO TABS: 1.0000 | ORAL_TABLET | Freq: Four times a day (QID) | ORAL | 2 refills | Status: DC

## 2017-05-27 DIAGNOSIS — G20 Parkinson's disease: Secondary | ICD-10-CM | POA: Diagnosis not present

## 2017-05-27 DIAGNOSIS — R4182 Altered mental status, unspecified: Secondary | ICD-10-CM | POA: Diagnosis not present

## 2017-05-27 DIAGNOSIS — I110 Hypertensive heart disease with heart failure: Secondary | ICD-10-CM | POA: Diagnosis not present

## 2017-05-27 DIAGNOSIS — I5022 Chronic systolic (congestive) heart failure: Secondary | ICD-10-CM | POA: Diagnosis not present

## 2017-05-27 DIAGNOSIS — F0280 Dementia in other diseases classified elsewhere without behavioral disturbance: Secondary | ICD-10-CM | POA: Diagnosis not present

## 2017-05-27 DIAGNOSIS — G309 Alzheimer's disease, unspecified: Secondary | ICD-10-CM | POA: Diagnosis not present

## 2017-05-30 ENCOUNTER — Emergency Department (HOSPITAL_COMMUNITY): Payer: Medicare HMO

## 2017-05-30 ENCOUNTER — Encounter (HOSPITAL_COMMUNITY)
Admission: EM | Disposition: A | Discharge status: Home Health | Payer: Self-pay | Source: Home / Self Care | Attending: Internal Medicine

## 2017-05-30 ENCOUNTER — Inpatient Hospital Stay (HOSPITAL_COMMUNITY)
Admission: EM | Admit: 2017-05-30 | Discharge: 2017-06-14 | DRG: 330 | Disposition: A | Discharge status: Home Health | Payer: Medicare HMO | Attending: Internal Medicine | Admitting: Internal Medicine

## 2017-05-30 ENCOUNTER — Encounter (HOSPITAL_COMMUNITY): Payer: Self-pay

## 2017-05-30 ENCOUNTER — Ambulatory Visit: Payer: Medicare HMO

## 2017-05-30 DIAGNOSIS — K649 Unspecified hemorrhoids: Secondary | ICD-10-CM | POA: Diagnosis not present

## 2017-05-30 DIAGNOSIS — M1991 Primary osteoarthritis, unspecified site: Secondary | ICD-10-CM | POA: Diagnosis present

## 2017-05-30 DIAGNOSIS — I252 Old myocardial infarction: Secondary | ICD-10-CM | POA: Diagnosis not present

## 2017-05-30 DIAGNOSIS — R4182 Altered mental status, unspecified: Secondary | ICD-10-CM | POA: Diagnosis not present

## 2017-05-30 DIAGNOSIS — Z91013 Allergy to seafood: Secondary | ICD-10-CM | POA: Diagnosis not present

## 2017-05-30 DIAGNOSIS — Z8546 Personal history of malignant neoplasm of prostate: Secondary | ICD-10-CM | POA: Diagnosis not present

## 2017-05-30 DIAGNOSIS — M419 Scoliosis, unspecified: Secondary | ICD-10-CM | POA: Diagnosis present

## 2017-05-30 DIAGNOSIS — Z515 Encounter for palliative care: Secondary | ICD-10-CM

## 2017-05-30 DIAGNOSIS — G309 Alzheimer's disease, unspecified: Secondary | ICD-10-CM | POA: Diagnosis present

## 2017-05-30 DIAGNOSIS — F329 Major depressive disorder, single episode, unspecified: Secondary | ICD-10-CM | POA: Diagnosis not present

## 2017-05-30 DIAGNOSIS — M549 Dorsalgia, unspecified: Secondary | ICD-10-CM | POA: Diagnosis not present

## 2017-05-30 DIAGNOSIS — R1084 Generalized abdominal pain: Secondary | ICD-10-CM

## 2017-05-30 DIAGNOSIS — K625 Hemorrhage of anus and rectum: Secondary | ICD-10-CM | POA: Diagnosis not present

## 2017-05-30 DIAGNOSIS — K562 Volvulus: Secondary | ICD-10-CM | POA: Diagnosis not present

## 2017-05-30 DIAGNOSIS — D649 Anemia, unspecified: Secondary | ICD-10-CM | POA: Diagnosis not present

## 2017-05-30 DIAGNOSIS — F0390 Unspecified dementia without behavioral disturbance: Secondary | ICD-10-CM | POA: Diagnosis not present

## 2017-05-30 DIAGNOSIS — E876 Hypokalemia: Secondary | ICD-10-CM | POA: Diagnosis not present

## 2017-05-30 DIAGNOSIS — Z7982 Long term (current) use of aspirin: Secondary | ICD-10-CM

## 2017-05-30 DIAGNOSIS — G20 Parkinson's disease: Secondary | ICD-10-CM | POA: Diagnosis present

## 2017-05-30 DIAGNOSIS — K5730 Diverticulosis of large intestine without perforation or abscess without bleeding: Secondary | ICD-10-CM | POA: Diagnosis present

## 2017-05-30 DIAGNOSIS — R195 Other fecal abnormalities: Secondary | ICD-10-CM | POA: Diagnosis not present

## 2017-05-30 DIAGNOSIS — F0280 Dementia in other diseases classified elsewhere without behavioral disturbance: Secondary | ICD-10-CM | POA: Diagnosis present

## 2017-05-30 DIAGNOSIS — R140 Abdominal distension (gaseous): Secondary | ICD-10-CM | POA: Diagnosis not present

## 2017-05-30 DIAGNOSIS — G219 Secondary parkinsonism, unspecified: Secondary | ICD-10-CM | POA: Diagnosis not present

## 2017-05-30 DIAGNOSIS — Z7901 Long term (current) use of anticoagulants: Secondary | ICD-10-CM

## 2017-05-30 DIAGNOSIS — I2782 Chronic pulmonary embolism: Secondary | ICD-10-CM | POA: Diagnosis not present

## 2017-05-30 DIAGNOSIS — D352 Benign neoplasm of pituitary gland: Secondary | ICD-10-CM | POA: Diagnosis present

## 2017-05-30 DIAGNOSIS — K5939 Other megacolon: Secondary | ICD-10-CM | POA: Diagnosis not present

## 2017-05-30 DIAGNOSIS — I2510 Atherosclerotic heart disease of native coronary artery without angina pectoris: Secondary | ICD-10-CM | POA: Diagnosis present

## 2017-05-30 DIAGNOSIS — K3189 Other diseases of stomach and duodenum: Secondary | ICD-10-CM | POA: Diagnosis not present

## 2017-05-30 DIAGNOSIS — E785 Hyperlipidemia, unspecified: Secondary | ICD-10-CM | POA: Diagnosis present

## 2017-05-30 DIAGNOSIS — Z9109 Other allergy status, other than to drugs and biological substances: Secondary | ICD-10-CM

## 2017-05-30 DIAGNOSIS — R9431 Abnormal electrocardiogram [ECG] [EKG]: Secondary | ICD-10-CM | POA: Diagnosis not present

## 2017-05-30 DIAGNOSIS — R400 Somnolence: Secondary | ICD-10-CM | POA: Diagnosis not present

## 2017-05-30 DIAGNOSIS — R933 Abnormal findings on diagnostic imaging of other parts of digestive tract: Secondary | ICD-10-CM | POA: Diagnosis not present

## 2017-05-30 DIAGNOSIS — R0902 Hypoxemia: Secondary | ICD-10-CM | POA: Diagnosis not present

## 2017-05-30 DIAGNOSIS — R109 Unspecified abdominal pain: Secondary | ICD-10-CM | POA: Diagnosis not present

## 2017-05-30 DIAGNOSIS — I5022 Chronic systolic (congestive) heart failure: Secondary | ICD-10-CM | POA: Diagnosis present

## 2017-05-30 DIAGNOSIS — F419 Anxiety disorder, unspecified: Secondary | ICD-10-CM | POA: Diagnosis not present

## 2017-05-30 DIAGNOSIS — I110 Hypertensive heart disease with heart failure: Secondary | ICD-10-CM | POA: Diagnosis not present

## 2017-05-30 DIAGNOSIS — R100 Acute abdomen: Secondary | ICD-10-CM | POA: Diagnosis not present

## 2017-05-30 DIAGNOSIS — R198 Other specified symptoms and signs involving the digestive system and abdomen: Secondary | ICD-10-CM | POA: Diagnosis not present

## 2017-05-30 DIAGNOSIS — Z7189 Other specified counseling: Secondary | ICD-10-CM | POA: Diagnosis not present

## 2017-05-30 DIAGNOSIS — Z79899 Other long term (current) drug therapy: Secondary | ICD-10-CM | POA: Diagnosis not present

## 2017-05-30 DIAGNOSIS — Z86711 Personal history of pulmonary embolism: Secondary | ICD-10-CM

## 2017-05-30 DIAGNOSIS — R000 Tachycardia, unspecified: Secondary | ICD-10-CM | POA: Diagnosis not present

## 2017-05-30 DIAGNOSIS — G8929 Other chronic pain: Secondary | ICD-10-CM | POA: Diagnosis present

## 2017-05-30 DIAGNOSIS — I700 Atherosclerosis of aorta: Secondary | ICD-10-CM | POA: Diagnosis present

## 2017-05-30 DIAGNOSIS — R55 Syncope and collapse: Secondary | ICD-10-CM | POA: Diagnosis not present

## 2017-05-30 HISTORY — DX: Volvulus: K56.2

## 2017-05-30 HISTORY — PX: COLONOSCOPY: SHX5424

## 2017-05-30 LAB — CBC WITH DIFFERENTIAL/PLATELET
BASOS PCT: 0 %
Basophils Absolute: 0 10*3/uL (ref 0.0–0.1)
Eosinophils Absolute: 0.1 10*3/uL (ref 0.0–0.7)
Eosinophils Relative: 1 %
HEMATOCRIT: 42.5 % (ref 39.0–52.0)
Hemoglobin: 13.5 g/dL (ref 13.0–17.0)
LYMPHS ABS: 1.2 10*3/uL (ref 0.7–4.0)
Lymphocytes Relative: 30 %
MCH: 30.7 pg (ref 26.0–34.0)
MCHC: 31.8 g/dL (ref 30.0–36.0)
MCV: 96.6 fL (ref 78.0–100.0)
MONO ABS: 0.3 10*3/uL (ref 0.1–1.0)
Monocytes Relative: 8 %
NEUTROS ABS: 2.5 10*3/uL (ref 1.7–7.7)
Neutrophils Relative %: 61 %
Platelets: 160 10*3/uL (ref 150–400)
RBC: 4.4 MIL/uL (ref 4.22–5.81)
RDW: 15 % (ref 11.5–15.5)
WBC: 4 10*3/uL (ref 4.0–10.5)

## 2017-05-30 LAB — COMPREHENSIVE METABOLIC PANEL
ALBUMIN: 3.7 g/dL (ref 3.5–5.0)
ALK PHOS: 37 U/L — AB (ref 38–126)
ALT: 6 U/L — ABNORMAL LOW (ref 17–63)
ANION GAP: 10 (ref 5–15)
AST: 30 U/L (ref 15–41)
BILIRUBIN TOTAL: 0.7 mg/dL (ref 0.3–1.2)
BUN: 12 mg/dL (ref 6–20)
CALCIUM: 9.1 mg/dL (ref 8.9–10.3)
CO2: 22 mmol/L (ref 22–32)
Chloride: 110 mmol/L (ref 101–111)
Creatinine, Ser: 1.03 mg/dL (ref 0.61–1.24)
GFR calc Af Amer: >60 mL/min (ref >60)
Glucose, Bld: 125 mg/dL — ABNORMAL HIGH (ref 65–99)
POTASSIUM: 3.8 mmol/L (ref 3.5–5.1)
Sodium: 142 mmol/L (ref 135–145)
TOTAL PROTEIN: 6.8 g/dL (ref 6.5–8.1)

## 2017-05-30 LAB — I-STAT CG4 LACTIC ACID, ED: LACTIC ACID, VENOUS: 1 mmol/L (ref 0.5–1.9)

## 2017-05-30 LAB — PROTIME-INR
INR: 1.54
PROTHROMBIN TIME: 18.4 s — AB (ref 11.4–15.2)

## 2017-05-30 LAB — LIPASE, BLOOD: Lipase: 27 U/L (ref 11–51)

## 2017-05-30 SURGERY — COLONOSCOPY
Anesthesia: Moderate Sedation

## 2017-05-30 SURGERY — SIGMOIDOSCOPY, FLEXIBLE
Anesthesia: Moderate Sedation

## 2017-05-30 MED ORDER — SODIUM CHLORIDE 0.9 % IV BOLUS (SEPSIS)
500.0000 mL | Freq: Once | INTRAVENOUS | Status: AC
  Administered 2017-05-30: 500 mL via INTRAVENOUS

## 2017-05-30 MED ORDER — FENTANYL CITRATE (PF) 100 MCG/2ML IJ SOLN
INTRAMUSCULAR | Status: AC
  Filled 2017-05-30: qty 2

## 2017-05-30 MED ORDER — MORPHINE SULFATE (PF) 4 MG/ML IV SOLN: 4.0000 mg | INTRAVENOUS | Status: DC | PRN

## 2017-05-30 MED ORDER — SODIUM CHLORIDE 0.9 % IV SOLN
Freq: Once | INTRAVENOUS | Status: AC
  Administered 2017-05-30: 18:00:00 via INTRAVENOUS

## 2017-05-30 MED ORDER — FENTANYL CITRATE (PF) 100 MCG/2ML IJ SOLN
25.0000 ug | Freq: Once | INTRAMUSCULAR | Status: AC
  Administered 2017-05-30: 25 ug via INTRAVENOUS
  Filled 2017-05-30: qty 2

## 2017-05-30 MED ORDER — IOPAMIDOL (ISOVUE-300) INJECTION 61%
INTRAVENOUS | Status: AC
  Administered 2017-05-30: 100 mL
  Filled 2017-05-30: qty 100

## 2017-05-30 MED ORDER — SODIUM CHLORIDE 0.9 % IV SOLN
Freq: Once | INTRAVENOUS | Status: AC
  Administered 2017-05-30: 23:00:00 via INTRAVENOUS

## 2017-05-30 MED ORDER — MIDAZOLAM HCL 5 MG/ML IJ SOLN
INTRAMUSCULAR | Status: AC
  Filled 2017-05-30: qty 2

## 2017-05-30 MED ORDER — MORPHINE SULFATE (PF) 4 MG/ML IV SOLN
4.0000 mg | Freq: Once | INTRAVENOUS | Status: AC
  Administered 2017-05-30: 4 mg via INTRAVENOUS
  Filled 2017-05-30: qty 1

## 2017-05-30 NOTE — ED Triage Notes (Signed)
Patient arrived by [facility] for evaluation of abdominal mass that has increased since daughter noticed same yesterday. Patient has had similar swelling in past and reported abdominal/bowel torsion in past. No vomiting, no other complaints

## 2017-05-30 NOTE — Progress Notes (Signed)
Extended flexible sigmoidoscopy performed, with typical volvulus findings, stringent efforts at decompression, and substantial post-procedure reduction in patient's abdominal firmness and distension (by exam).  Please see procedure report for more details.  Recomm:  1. In-house observation--pt at risk for recurrent distension/volvulus b/o (presumed) underlying chronic colonic atony. 2. Serial KUB's (tonight and in morning) to monitor progress. 3. Discussed option of sigmoid resection as definitive mgt for this condition, but given pt's advanced age, debility, and the fact that he's gone almost a year between episodes, the family feel (and I agree) that at present surgery does not appear to be worth the risk. 4. We will follow with you in-house.  Cleotis Nipper, M.D. Pager [phone] If no answer or after 5 PM call [phone]

## 2017-05-30 NOTE — Plan of Care (Signed)
Case discussed with Dr. Jacelyn Grip.  Xray suggestive possible sigmoid volvulus.  Similar presentation in November 2017 requiring decompression, no definitive surgery done afterwards as best I can tell.  CT scan and surgical consultation ordered.  Based on these studies, if it is felt that patient has volvulus, I've asked Dr. Billy Fischer or the surgeons to call our on call doctor if endoscopic decompression is desired.

## 2017-05-30 NOTE — ED Provider Notes (Signed)
17:47 Dr. Cristina Gong has returned consultation call and plans to do endoscopic reduction of sigmoid volvulus. Anticipates being at bedside at about 6:15 ready to obtain consents and take the patient to endoscopic lab. Patient has remained stable. I have done recheck. I have updated family. I have ordered fluid rate for maintenance fluids and morphine for pain control.   Charlesetta Shanks, MD 05/30/17 [phone]

## 2017-05-30 NOTE — ED Notes (Signed)
Lilli Light (daughter) [phone]

## 2017-05-30 NOTE — ED Notes (Signed)
Placed on 2lpm O2 [place] d/t SpO2 decreasing to low 90s following pain medication administration.

## 2017-05-30 NOTE — Consult Note (Signed)
Referring Provider:  Charlesetta Shanks, MD---[facility] Primary Care Physician:  [name], DO Primary Gastroenterologist:  None (unassigned)  Reason for Consultation:  Recurrent sigmoid volvulus  HPI: [name] [name] is a 83 y.o. male who had sigmoidoscopic decompression of the sigmoid volvulus back in November, presented with similar symptoms and CT findings today. He has dementia and is cared for at home by his very attentive family. He stopped eating, and had a tight abdomen, prompting evaluation in the emergency room, where he apparently did receive some morphine. White count normal, CT negative except for sigmoid volvulus. On his prior admission, it took two sigmoidoscopies, about 4 days apart, to get him successfully decompressed. The patient is on Coumadin as an outpatient but has not had it for several days and his INR is normal today   Past Medical History:  Diagnosis Date  . Anxiety   . Coronary artery disease, non-occlusive    with history of MI; Cath 2008 w multivessel nonobstructive CAD  . Dementia   . Depression   . GERD (gastroesophageal reflux disease)   . Hyperlipidemia   . Hypertension   . Memory loss   . Parkinson disease ([place])   . PE (pulmonary embolism)    unprovoked PE completed 6 months of warfarin, warfarin d/ced 02/12/2010, repeat PE 07/10/2010 after a long car ride and now on lifelong coumadin.  . Pituitary microadenoma ([place])    incidental finding CT 12/2009  . Prostate cancer ([place])   . Scoliosis   . Sigmoid volvulus ([place]) 08/02/2016    Past Surgical History:  Procedure Laterality Date  . FLEXIBLE SIGMOIDOSCOPY N/A 08/02/2016   Procedure: FLEXIBLE SIGMOIDOSCOPY;  Surgeon: Ronald Lobo, MD;  Location: [facility] ENDOSCOPY;  Service: Endoscopy;  Laterality: N/A;  . FLEXIBLE SIGMOIDOSCOPY N/A 08/06/2016   Procedure: FLEXIBLE SIGMOIDOSCOPY;  Surgeon: Ronald Lobo, MD;  Location: [facility] ENDOSCOPY;  Service: Endoscopy;  Laterality: N/A;  . LEFT HEART CATHETERIZATION  WITH CORONARY ANGIOGRAM N/A 12/10/2014   Procedure: LEFT HEART CATHETERIZATION WITH CORONARY ANGIOGRAM;  Surgeon: [name], MD;  Location: [facility] CATH LAB;  Service: Cardiovascular;  Laterality: N/A;  . LOOP RECORDER IMPLANT N/A 12/16/2014   Procedure: LOOP RECORDER IMPLANT;  Surgeon: [name], MD;  Location: [facility] CATH LAB;  Service: Cardiovascular;  Laterality: N/A;  . PROSTATECTOMY      Prior to Admission medications   Medication Sig Start Date End Date Taking? Authorizing Provider  acetaminophen (TYLENOL) 500 MG tablet Take 1,000 mg by mouth every 4 (four) hours as needed for mild pain or headache.     [provider]  amLODipine (NORVASC) 2.5 MG tablet Take 1 tablet (2.5 mg total) by mouth daily. 12/13/16   [name], MD  aspirin 81 MG chewable tablet Chew 81 mg by mouth daily as needed (chest pain).     [provider]  b complex vitamins tablet Take 1 tablet by mouth every morning.     [provider]  Ca Phosphate-Cholecalciferol (CALCIUM/VITAMIN D3 GUMMIES) 250-350 MG-UNIT CHEW Chew 1 Dose by mouth 2 (two) times daily. 07/08/16   [name], DO  carbidopa-levodopa (SINEMET IR) 25-100 MG tablet Take 1 tablet by mouth 4 (four) times daily. 05/26/17   [name], DO  fish oil-omega-3 fatty acids 1000 MG capsule Take 2 g by mouth every morning.     [provider]  memantine (NAMENDA XR) 28 MG CP24 24 hr capsule Take 1 capsule (28 mg total) by mouth every morning. 04/25/17  Garvin Fila, MD  Multiple Vitamins-Minerals (MULTIVITAMIN WITH MINERALS) tablet Take 1 tablet by mouth every morning.     [provider]  polyethylene glycol (MIRALAX / GLYCOLAX) packet Take 17 g by mouth daily as needed. Patient taking differently: Take 17 g by mouth daily as needed for mild constipation.  07/13/16   Velna Ochs, MD  potassium chloride SA (KLOR-CON M20) 20 MEQ tablet Take 2 tablets (40 mEq total) by mouth daily. 05/26/17   [name], DO  pravastatin (PRAVACHOL) 10 MG tablet Take 1 tablet (10 mg total) by mouth daily. 03/08/17   [name], DO  QUEtiapine (SEROQUEL) 25 MG tablet Take 1 tablet (25 mg total) by mouth at bedtime. May use half a tablet 12.5 mg during the daytime as needed for agitation/hallucinations 04/05/17   Garvin Fila, MD  senna (SENOKOT) 8.6 MG TABS tablet Take 1 tablet (8.6 mg total) by mouth daily as needed for mild constipation. 07/13/16   Velna Ochs, MD  vitamin C (ASCORBIC ACID) 500 MG tablet Take 1,000 mg by mouth every morning.     [provider]  warfarin (COUMADIN) 5 MG tablet Take 0.5-1 tablets (2.5-5 mg total) by mouth See admin instructions. Take 1 tablet on Mon, and Fri. Take 1/2 tablet all other days 05/26/17   [name], DO    No current facility-administered medications for this encounter.     Allergies as of 05/30/2017 - Review Complete 05/30/2017  Allergen Reaction Noted  . Aricept [donepezil hcl] Other (See Comments) 07/24/2013  . Other Other (See Comments) 08/17/2011  . Shellfish-derived products Other (See Comments) 01/21/2016    Family History  Problem Relation Age of Onset  . Hypertension Father        Passed away from cerebral hemorrhage at age of 17.  [name] Dementia Mother        Passed away  . Hypertension Child        4 adult children  . Cervical cancer Other   . Lung cancer Other   . Stroke Other   . Heart attack Neg Hx     Social History   Social History  . Marital status: Married    Spouse name: N/A  . Number of children: 4  . Years of education: master's   Occupational History  . Retired Retired   Social History Main Topics  . Smoking status: Never Smoker  . Smokeless tobacco: Never Used  . Alcohol use No  . Drug use: No  . Sexual activity: No   Other Topics Concern  . Not on file   Social History Narrative   Lives with daughter. Wife also has severe dementia.    Drinks 1 cup of coffee a day     Review of Systems:    Physical Exam: Vital signs in last 24 hours: Temp:  [98.4 F (36.9 C)] 98.4 F (36.9 C) (09/10 1347) Pulse Rate:  [71-81] 71 (09/10 1935) Resp:  [12-18] 13 (09/10 1935) BP: (157-200)/(105-137) 200/137 (09/10 1925) SpO2:  [95 %-100 %] 99 % (09/10 1935)   This is a chronically ill-appearing African-American gentleman, asleep, not really arousable to voice, following morphine administration. He is in no distress whatsoever. Blood pressure is elevated, oxygen saturation satisfactory, heart rate normal. Chest clear anteriorly, heart without murmur or arrhythmia, abdomen mildly distended equal to about 5 or possibly 6 months of pregnancy, primarily in the right upper quadrant, where it is firm but without tenderness. Bowel sounds are  quiet. No evident guarding, firm but no rigidity, no rebound.  Intake/Output from previous day: No intake/output data recorded. Intake/Output this shift: No intake/output data recorded.  Lab Results:  Recent Labs  05/30/17 1403  WBC 4.0  HGB 13.5  HCT 42.5  PLT 160   BMET  Recent Labs  05/30/17 1403  NA 142  K 3.8  CL 110  CO2 22  GLUCOSE 125*  BUN 12  CREATININE 1.03  CALCIUM 9.1   LFT  Recent Labs  05/30/17 1403  PROT 6.8  ALBUMIN 3.7  AST 30  ALT 6*  ALKPHOS 37*  BILITOT 0.7   PT/INR  Recent Labs  05/30/17 1403  LABPROT 18.4*  INR 1.54    Studies/Results: Ct Abdomen Pelvis W Contrast  Result Date: 05/30/2017 CLINICAL DATA:  Abdominal distension.  Possible sigmoid volvulus. EXAM: CT ABDOMEN AND PELVIS WITH CONTRAST TECHNIQUE: Multidetector CT imaging of the abdomen and pelvis was performed using the standard protocol following bolus administration of intravenous contrast. CONTRAST:  100 cc ISOVUE-300 IOPAMIDOL (ISOVUE-300) INJECTION 61% COMPARISON:  08/01/2016 FINDINGS: Lower chest: Degraded by motion artifact. Subsegmental atelectasis bilaterally can scarring changes. Elevation of both hemidiaphragms. The heart is normal  in size. Stable coronary artery calcifications. Hepatobiliary: No focal hepatic lesions or intrahepatic biliary dilatation. The gallbladder is grossly normal. Colonic interposition is noted. Pancreas: Pancreatic atrophy but no mass or inflammation. Spleen: Normal size.  Stable small cysts. Adrenals/Urinary Tract: The adrenal glands and kidneys are unremarkable and stable. Multiple small renal cysts and chronic renal scarring changes. No hydronephrosis. The bladder is unremarkable. Prior TURP defect. Stomach/Bowel: Markedly distended and tortuous sigmoid colon with swirled appearance of the mid sigmoid colon consistent with a sigmoid volvulus. This looks very similar to the prior CT scan 2017. Large amount of stool is noted in the ascending colon, transverse colon and descending colon. Vascular/Lymphatic: With marked tortuosity of the thoracic aorta. Moderate atherosclerotic calcifications distally and involving the iliac arteries. No mesenteric or retroperitoneal mass or adenopathy. Reproductive: The prostate gland and seminal vesicles are surgically absent. Other: No inguinal mass or adenopathy. No free pelvic fluid collections. Musculoskeletal: Scoliosis and severe degenerative lumbar spondylosis. IMPRESSION: 1. CT findings consistent with a sigmoid volvulus. Very similar findings to the prior CT scan from November 2017. 2. No other significant abdominal/pelvic findings. 3. Bibasilar scarring changes and atelectasis. Electronically Signed   By: Marijo Sanes M.D.   On: 05/30/2017 17:20   Dg Abdomen Acute W/chest  Result Date: 05/30/2017 CLINICAL DATA:  Abdominal swelling. History of bowel torsion in the past. No vomiting. EXAM: DG ABDOMEN ACUTE W/ 1V CHEST COMPARISON:  Chest x-ray of November 15, 2016 FINDINGS: The lungs are hypoinflated. The hemidiaphragms are elevated due to of moderately distended gas-filled loops of small and large bowel under the hemidiaphragms. There is left basilar density compatible  with atelectasis. The heart is not enlarged. The pulmonary vascularity is not engorged. Within the abdomen there are loops of moderately distended gas-filled bowel present with the largest caliber of a bowel loop as much as 13 cm. The most distended bowel appears to be colon but some small bowel distention is present. There is stool present within portions of the colon. There is a small amount of rectal gas. No free extraluminal gas collections are observed. There is degenerative change of the lumbar spine with scoliosis convex toward the right. IMPRESSION: Distension of small and large bowel loops within the abdomen. One cannot exclude sigmoid volvulus in the appropriate setting. Surgical consultation  and abdominal and pelvic CT scanning would be useful. Electronically Signed   By: David  Martinique M.D.   On: 05/30/2017 15:14    Impression: Uncomplicated sigmoid volvulus, recurrent  Plan: Sigmoidoscopic decompression this evening. Procedure is familiar to the patient's family who have provided written consent on his behalf. I have spoken with him about my plans for the procedure and they are agreeable. Based on the patient's previous post-sigmoidoscopic condition, he will probably need to be retained in the hospital as an inpatient to monitor the evolution of his bowel condition and possibly have a repeat procedure in one or several days.   LOS: 0 days   Claudie Brickhouse V  05/30/2017, 7:51 PM   Pager [phone] If no answer or after 5 PM call [phone]

## 2017-05-30 NOTE — Consult Note (Signed)
Reason for Consult: Possible sigmoid volvulus Referring Physician: Gareth Morgan, MD   [name] [name] is an 83 y.o. male.  HPI: 83 y/o with Alzheimer's/Parkinson's disease seen in the clinic 05/07/17 with increased AMS.  Today the daughter has noted an abdominal mass, and had pt transported to ED.  She says it was noted yesterday PM at home with PT.  It has become more significant today and she had him brought to the ED.   He has a hx of sigmoid volvulus 08/08/16 and underwent flexible sigmoidoscopy by Dr. Cristina Gong at that time on 11/13, and again on 08/06/16.  He did well after second decompression and this is the first time back to [facility].   Work up in the ED so far shows he I s afebrile, BP up on admit. CMP is normal except for a glucose of 125, CBC is normal. INR 1.54 Films this AM shows:   Distension of small and large bowel loops within the abdomen. One cannot exclude sigmoid volvulus in the appropriate setting. GI has been called and we are also ask to see.     Past Medical History:  Diagnosis Date  . Anxiety   . Coronary artery disease, non-occlusive    with history of MI; Cath 2008 w multivessel nonobstructive CAD  . Dementia   . Depression   . GERD (gastroesophageal reflux disease)   . Hyperlipidemia   . Hypertension   . Memory loss   . Parkinson disease ([place])   . PE (pulmonary embolism)    unprovoked PE completed 6 months of warfarin, warfarin d/ced 02/12/2010, repeat PE 07/10/2010 after a long car ride and now on lifelong coumadin.  . Pituitary microadenoma ([place])    incidental finding CT 12/2009  . Prostate cancer ([place])   . Scoliosis   . Sigmoid volvulus ([place]) 08/02/2016    Past Surgical History:  Procedure Laterality Date  . FLEXIBLE SIGMOIDOSCOPY N/A 08/02/2016   Procedure: FLEXIBLE SIGMOIDOSCOPY;  Surgeon: Ronald Lobo, MD;  Location: [facility] ENDOSCOPY;  Service: Endoscopy;  Laterality: N/A;  . FLEXIBLE SIGMOIDOSCOPY N/A 08/06/2016   Procedure: FLEXIBLE SIGMOIDOSCOPY;   Surgeon: Ronald Lobo, MD;  Location: [facility] ENDOSCOPY;  Service: Endoscopy;  Laterality: N/A;  . LEFT HEART CATHETERIZATION WITH CORONARY ANGIOGRAM N/A 12/10/2014   Procedure: LEFT HEART CATHETERIZATION WITH CORONARY ANGIOGRAM;  Surgeon: [name], MD;  Location: [facility] CATH LAB;  Service: Cardiovascular;  Laterality: N/A;  . LOOP RECORDER IMPLANT N/A 12/16/2014   Procedure: LOOP RECORDER IMPLANT;  Surgeon: [name], MD;  Location: [facility] CATH LAB;  Service: Cardiovascular;  Laterality: N/A;  . PROSTATECTOMY      Family History  Problem Relation Age of Onset  . Hypertension Father        Passed away from cerebral hemorrhage at age of 45.  [name] Dementia Mother        Passed away  . Hypertension Child        4 adult children  . Cervical cancer Other   . Lung cancer Other   . Stroke Other   . Heart attack Neg Hx     Social History:  reports that he has never smoked. He has never used smokeless tobacco. He reports that he does not drink alcohol or use drugs.  Allergies:  Allergies  Allergen Reactions  . Aricept [Donepezil Hcl] Other (See Comments)    Symptomatic Bradycardia.  . Other Other (See Comments)    Shrimp gives him gout  . Shellfish-Derived Products Other (See Comments)  Causes a flare up with Gout    Prior to Admission medications   Medication Sig Start Date End Date Taking? Authorizing Provider  acetaminophen (TYLENOL) 500 MG tablet Take 1,000 mg by mouth every 4 (four) hours as needed for mild pain or headache.     [provider]  amLODipine (NORVASC) 2.5 MG tablet Take 1 tablet (2.5 mg total) by mouth daily. 12/13/16   [name], MD  aspirin 81 MG chewable tablet Chew 81 mg by mouth daily as needed (chest pain).     [provider]  b complex vitamins tablet Take 1 tablet by mouth every morning.     [provider]  Ca Phosphate-Cholecalciferol (CALCIUM/VITAMIN D3 GUMMIES) 250-350 MG-UNIT CHEW Chew 1 Dose by mouth 2 (two) times daily.  07/08/16   [name], DO  carbidopa-levodopa (SINEMET IR) 25-100 MG tablet Take 1 tablet by mouth 4 (four) times daily. 05/26/17   [name], DO  fish oil-omega-3 fatty acids 1000 MG capsule Take 2 g by mouth every morning.     [provider]  memantine (NAMENDA XR) 28 MG CP24 24 hr capsule Take 1 capsule (28 mg total) by mouth every morning. 04/25/17   Garvin Fila, MD  Multiple Vitamins-Minerals (MULTIVITAMIN WITH MINERALS) tablet Take 1 tablet by mouth every morning.     [provider]  polyethylene glycol (MIRALAX / GLYCOLAX) packet Take 17 g by mouth daily as needed. Patient taking differently: Take 17 g by mouth daily as needed for mild constipation.  07/13/16   Velna Ochs, MD  potassium chloride SA (KLOR-CON M20) 20 MEQ tablet Take 2 tablets (40 mEq total) by mouth daily. 05/26/17   [name], DO  pravastatin (PRAVACHOL) 10 MG tablet Take 1 tablet (10 mg total) by mouth daily. 03/08/17   [name], DO  QUEtiapine (SEROQUEL) 25 MG tablet Take 1 tablet (25 mg total) by mouth at bedtime. May use half a tablet 12.5 mg during the daytime as needed for agitation/hallucinations 04/05/17   Garvin Fila, MD  senna (SENOKOT) 8.6 MG TABS tablet Take 1 tablet (8.6 mg total) by mouth daily as needed for mild constipation. 07/13/16   Velna Ochs, MD  vitamin C (ASCORBIC ACID) 500 MG tablet Take 1,000 mg by mouth every morning.     [provider]  warfarin (COUMADIN) 5 MG tablet Take 0.5-1 tablets (2.5-5 mg total) by mouth See admin instructions. Take 1 tablet on Mon, and Fri. Take 1/2 tablet all other days 05/26/17   [name], DO     Results for orders placed or performed during the hospital encounter of 05/30/17 (from the past 48 hour(s))  Protime-INR     Status: Abnormal   Collection Time: 05/30/17  2:03 PM  Result Value Ref Range   Prothrombin Time 18.4 (H) 11.4 - 15.2 seconds   INR 1.54   CBC with Differential     Status: None    Collection Time: 05/30/17  2:03 PM  Result Value Ref Range   WBC 4.0 4.0 - 10.5 K/uL   RBC 4.40 4.22 - 5.81 MIL/uL   Hemoglobin 13.5 13.0 - 17.0 g/dL   HCT 42.5 39.0 - 52.0 %   MCV 96.6 78.0 - 100.0 fL   MCH 30.7 26.0 - 34.0 pg   MCHC 31.8 30.0 - 36.0 g/dL   RDW 15.0 11.5 - 15.5 %   Platelets 160 150 - 400 K/uL   Neutrophils Relative %  61 %   Neutro Abs 2.5 1.7 - 7.7 K/uL   Lymphocytes Relative 30 %   Lymphs Abs 1.2 0.7 - 4.0 K/uL   Monocytes Relative 8 %   Monocytes Absolute 0.3 0.1 - 1.0 K/uL   Eosinophils Relative 1 %   Eosinophils Absolute 0.1 0.0 - 0.7 K/uL   Basophils Relative 0 %   Basophils Absolute 0.0 0.0 - 0.1 K/uL  Comprehensive metabolic panel     Status: Abnormal   Collection Time: 05/30/17  2:03 PM  Result Value Ref Range   Sodium 142 135 - 145 mmol/L   Potassium 3.8 3.5 - 5.1 mmol/L   Chloride 110 101 - 111 mmol/L   CO2 22 22 - 32 mmol/L   Glucose, Bld 125 (H) 65 - 99 mg/dL   BUN 12 6 - 20 mg/dL   Creatinine, Ser 1.03 0.61 - 1.24 mg/dL   Calcium 9.1 8.9 - 10.3 mg/dL   Total Protein 6.8 6.5 - 8.1 g/dL   Albumin 3.7 3.5 - 5.0 g/dL   AST 30 15 - 41 U/L   ALT 6 (L) 17 - 63 U/L   Alkaline Phosphatase 37 (L) 38 - 126 U/L   Total Bilirubin 0.7 0.3 - 1.2 mg/dL   GFR calc non Af Amer >60 >60 mL/min   GFR calc Af Amer >60 >60 mL/min    Comment: (NOTE) The eGFR has been calculated using the CKD EPI equation. This calculation has not been validated in all clinical situations. eGFR's persistently <60 mL/min signify possible Chronic Kidney Disease.    Anion gap 10 5 - 15  Lipase, blood     Status: None   Collection Time: 05/30/17  2:03 PM  Result Value Ref Range   Lipase 27 11 - 51 U/L    Dg Abdomen Acute W/chest  Result Date: 05/30/2017 CLINICAL DATA:  Abdominal swelling. History of bowel torsion in the past. No vomiting. EXAM: DG ABDOMEN ACUTE W/ 1V CHEST COMPARISON:  Chest x-ray of November 15, 2016 FINDINGS: The lungs are hypoinflated. The  hemidiaphragms are elevated due to of moderately distended gas-filled loops of small and large bowel under the hemidiaphragms. There is left basilar density compatible with atelectasis. The heart is not enlarged. The pulmonary vascularity is not engorged. Within the abdomen there are loops of moderately distended gas-filled bowel present with the largest caliber of a bowel loop as much as 13 cm. The most distended bowel appears to be colon but some small bowel distention is present. There is stool present within portions of the colon. There is a small amount of rectal gas. No free extraluminal gas collections are observed. There is degenerative change of the lumbar spine with scoliosis convex toward the right. IMPRESSION: Distension of small and large bowel loops within the abdomen. One cannot exclude sigmoid volvulus in the appropriate setting. Surgical consultation and abdominal and pelvic CT scanning would be useful. Electronically Signed   By: David  Martinique M.D.   On: 05/30/2017 15:14    Review of Systems  Unable to perform ROS: Dementia   Blood pressure (!) 157/105, pulse 81, temperature 98.4 F (36.9 C), temperature source Oral, resp. rate 18, height _0  (1.676 m), SpO2 95 %. Physical Exam  Constitutional: He is oriented to person, place, and time. He appears well-developed and well-nourished. No distress.  HENT:  Head: Normocephalic and atraumatic.  Mouth/Throat: No oropharyngeal exudate.  Eyes: Right eye exhibits no discharge. Left eye exhibits no discharge. No scleral icterus.  Pupils  are equal  Neck: Normal range of motion. Neck supple. No JVD present. No tracheal deviation present. No thyromegaly present.  Cardiovascular: Normal rate, regular rhythm and normal heart sounds.   No murmur heard. Respiratory: Effort normal and breath sounds normal. No respiratory distress. He has no wheezes.  GI: He exhibits distension (very distended ). He exhibits no mass. There is tenderness. There is  no rebound and no guarding.  Musculoskeletal: He exhibits no edema or tenderness.  Lymphadenopathy:    He has no cervical adenopathy.  Neurological: He is alert and oriented to person, place, and time. No cranial nerve deficit.  Skin: Skin is warm and dry. No rash noted. No erythema. No pallor.  Psychiatric: He has a normal mood and affect. His behavior is normal. Judgment and thought content normal.    Assessment/Plan: Sigmoid volvulus Alzheimers/Parkinson's Dementia/Memory loss - [facility] Pituitary microadenoma - stable per CT on 10/08/16 Hx of PE on coumadin Hypertension Scoliosis   Plan:  He is now going to CT, GI contacted and wants to be re consulted after the CT is back.  We will follow with you.  The daughter is not sure what they will want to do about long term care of this.  She is not happy about having to put him thru a surgery with his other comorbid issues.  We will follow with you.    Ahmar Pickrell 05/30/2017, 3:53 PM

## 2017-05-30 NOTE — ED Provider Notes (Addendum)
[facility] DEPT Provider Note   CSN: [id number] Arrival date & time: 05/30/17  1342     History   Chief Complaint No chief complaint on file.   HPI Romani Wilbon is a 83 y.o. male.  HPI   83 year old male with a history of pulmonary embolus on Coumadin, coronary disease, hypertension, hyperlipidemia, Parkinson's disease, dementia, prostate cancer, prior sigmoid volvulus in November 2017 corrected by flexible sigmoidoscopy x2 who presents with concern for abdominal distention and decreased appetite. Daughter reports that today she is noted he has not wanted to eat or drink anything. Reports that he seems more fatigued then normal. He has not had vomiting, but she suspects he is nauseous. Reports she has not met these passed flatus. He is not had any bowel movements today but did have some clear mucus from his rectum. She reports his symptoms today are exactly the same as when he had his volvulus.   Level V Caveat, dementia. Daughter providing history. 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   Past Medical History:  Diagnosis Date  . Anxiety   . Coronary artery disease, non-occlusive    with history of MI; Cath 2008 w multivessel nonobstructive CAD  . Dementia   . Depression   . GERD (gastroesophageal reflux disease)   . Hyperlipidemia   . Hypertension   . Memory loss   . Parkinson disease ([place])   . PE (pulmonary embolism)    unprovoked PE completed 6 months of warfarin, warfarin d/ced 02/12/2010, repeat PE 07/10/2010 after a long car ride and now on lifelong coumadin.  . Pituitary microadenoma ([place])    incidental finding CT 12/2009  . Prostate cancer ([place])   . Scoliosis   . Sigmoid volvulus ([place]) 08/02/2016    Patient Active Problem List   Diagnosis Date Noted  . Bad odor of urine 01/17/2017  . Loss of weight 09/23/2016  . Long term current use of anticoagulant   . Right bundle branch block   . Hypokalemia 08/02/2016  . Diverticulosis  07/15/2016  . Pyelonephritis 07/09/2016  . Statin myopathy 03/15/2016  . Parkinsonian syndrome ([place]) 08/11/2015  . Normocytic anemia, not due to blood loss 04/16/2015  . Chronic systolic heart failure ([place]) 04/16/2015  . Falls frequently 04/16/2015  . Onychomycosis 04/15/2015  . CAD (coronary artery disease) 01/16/2015  . 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 Syncope 12/07/2014  . Healthcare maintenance 10/10/2014  . Lumbosacral spondylosis without myelopathy 09/24/2013  . Altered mental status 02/20/2013  . Bradycardia 02/20/2013  . Generalized ischemic cerebrovascular disease 02/05/2013  . Depression 01/25/2013  . Gross hematuria 01/19/2013  . Insomnia 01/20/2012  . AKI (acute kidney injury) ([place]) 11/08/2011  . Alzheimer's dementia 08/17/2011  . History of pulmonary embolism 09/18/2010  . Encounter for long-term use of antiplatelets/antithrombotics 09/18/2010  . DJD (degenerative joint disease) 08/06/2010  . ADENOCARCINOMA, PROSTATE, HX OF 08/06/2010  . HLD (hyperlipidemia) 10/15/2009  . Essential hypertension, benign 10/15/2009  . GERD 10/15/2009    Past Surgical History:  Procedure Laterality Date  . FLEXIBLE SIGMOIDOSCOPY N/A 08/02/2016   Procedure: FLEXIBLE SIGMOIDOSCOPY;  Surgeon: Ronald Lobo, MD;  Location: [facility] ENDOSCOPY;  Service: Endoscopy;  Laterality: N/A;  . FLEXIBLE SIGMOIDOSCOPY N/A 08/06/2016   Procedure: FLEXIBLE SIGMOIDOSCOPY;  Surgeon: Ronald Lobo, MD;  Location: [facility] ENDOSCOPY;  Service: Endoscopy;  Laterality: N/A;  . LEFT HEART CATHETERIZATION WITH CORONARY ANGIOGRAM N/A 12/10/2014   Procedure: LEFT HEART CATHETERIZATION WITH CORONARY ANGIOGRAM;  Surgeon: [name], MD;  Location: [facility] CATH  LAB;  Service: Cardiovascular;  Laterality: N/A;  . LOOP RECORDER IMPLANT N/A 12/16/2014   Procedure: LOOP RECORDER IMPLANT;  Surgeon: [name], MD;  Location: [facility] CATH LAB;  Service: Cardiovascular;  Laterality: N/A;  . PROSTATECTOMY         Home Medications     Prior to Admission medications   Medication Sig Start Date End Date Taking? Authorizing Provider  acetaminophen (TYLENOL) 500 MG tablet Take 1,000 mg by mouth every 4 (four) hours as needed for mild pain or headache.     [provider]  amLODipine (NORVASC) 2.5 MG tablet Take 1 tablet (2.5 mg total) by mouth daily. 12/13/16   [name], MD  aspirin 81 MG chewable tablet Chew 81 mg by mouth daily as needed (chest pain).     [provider]  b complex vitamins tablet Take 1 tablet by mouth every morning.     [provider]  Ca Phosphate-Cholecalciferol (CALCIUM/VITAMIN D3 GUMMIES) 250-350 MG-UNIT CHEW Chew 1 Dose by mouth 2 (two) times daily. 07/08/16   [name], DO  carbidopa-levodopa (SINEMET IR) 25-100 MG tablet Take 1 tablet by mouth 4 (four) times daily. 05/26/17   [name], DO  fish oil-omega-3 fatty acids 1000 MG capsule Take 2 g by mouth every morning.     [provider]  memantine (NAMENDA XR) 28 MG CP24 24 hr capsule Take 1 capsule (28 mg total) by mouth every morning. 04/25/17   Garvin Fila, MD  Multiple Vitamins-Minerals (MULTIVITAMIN WITH MINERALS) tablet Take 1 tablet by mouth every morning.     [provider]  polyethylene glycol (MIRALAX / GLYCOLAX) packet Take 17 g by mouth daily as needed. Patient taking differently: Take 17 g by mouth daily as needed for mild constipation.  07/13/16   Velna Ochs, MD  potassium chloride SA (KLOR-CON M20) 20 MEQ tablet Take 2 tablets (40 mEq total) by mouth daily. 05/26/17   [name], DO  pravastatin (PRAVACHOL) 10 MG tablet Take 1 tablet (10 mg total) by mouth daily. 03/08/17   [name], DO  QUEtiapine (SEROQUEL) 25 MG tablet Take 1 tablet (25 mg total) by mouth at bedtime. May use half a tablet 12.5 mg during the daytime as needed for agitation/hallucinations 04/05/17   Garvin Fila, MD  senna (SENOKOT) 8.6 MG TABS tablet Take 1 tablet (8.6 mg total) by mouth  daily as needed for mild constipation. 07/13/16   Velna Ochs, MD  vitamin C (ASCORBIC ACID) 500 MG tablet Take 1,000 mg by mouth every morning.     [provider]  warfarin (COUMADIN) 5 MG tablet Take 0.5-1 tablets (2.5-5 mg total) by mouth See admin instructions. Take 1 tablet on Mon, and Fri. Take 1/2 tablet all other days 05/26/17   [name], DO    Family History Family History  Problem Relation Age of Onset  . Hypertension Father        Passed away from cerebral hemorrhage at age of 73.  [name] Dementia Mother        Passed away  . Hypertension Child        4 adult children  . Cervical cancer Other   . Lung cancer Other   . Stroke Other   . Heart attack Neg Hx     Social History Social History  Substance Use Topics  . Smoking status: Never Smoker  . Smokeless tobacco: Never Used  . Alcohol use No  Allergies   Aricept [donepezil hcl]; Other; and Shellfish-derived products   Review of Systems Review of Systems  Unable to perform ROS: Dementia  Constitutional: Positive for appetite change and fatigue. Negative for fever.  Gastrointestinal: Positive for abdominal pain, constipation and nausea. Negative for diarrhea and vomiting.     Physical Exam Updated Vital Signs BP (!) 157/105   Pulse 81   Temp 98.4 F (36.9 C) (Oral)   Resp 18   Ht _0  (1.676 m)   SpO2 95%   Physical Exam  Constitutional: He is oriented to person, place, and time. He appears well-developed and well-nourished. No distress.  HENT:  Head: Normocephalic and atraumatic.  Eyes: Conjunctivae and EOM are normal.  Neck: Normal range of motion.  Cardiovascular: Normal rate, regular rhythm, normal heart sounds and intact distal pulses.  Exam reveals no gallop and no friction rub.   No murmur heard. Pulmonary/Chest: Effort normal and breath sounds normal. No respiratory distress. He has no wheezes. He has no rales.  Abdominal: Soft. He exhibits distension (epigastric  distention). There is tenderness. There is no guarding.  Musculoskeletal: He exhibits no edema.  Neurological: He is alert and oriented to person, place, and time.  Skin: Skin is warm and dry. He is not diaphoretic.  Nursing note and vitals reviewed.    ED Treatments / Results  Labs (all labs ordered are listed, but only abnormal results are displayed) Labs Reviewed  PROTIME-INR - Abnormal; Notable for the following:       Result Value   Prothrombin Time 18.4 (*)    All other components within normal limits  COMPREHENSIVE METABOLIC PANEL - Abnormal; Notable for the following:    Glucose, Bld 125 (*)    ALT 6 (*)    Alkaline Phosphatase 37 (*)    All other components within normal limits  CBC WITH DIFFERENTIAL/PLATELET  LIPASE, BLOOD  I-STAT CG4 LACTIC ACID, ED  I-STAT CG4 LACTIC ACID, ED    EKG  EKG Interpretation  Date/Time:  Monday May 30 2017 14:16:48 EDT Ventricular Rate:  77 PR Interval:  182 QRS Duration: 140 QT Interval:  414 QTC Calculation: 468 R Axis:   -71 Text Interpretation:  Normal sinus rhythm Possible Left atrial enlargement Right bundle branch block Left anterior fascicular block **Bifascicular block ** No significant change since last tracing Confirmed by Gareth Morgan [phone]) on 05/30/2017 5:19:21 PM       Radiology Dg Abdomen Acute W/chest  Result Date: 05/30/2017 CLINICAL DATA:  Abdominal swelling. History of bowel torsion in the past. No vomiting. EXAM: DG ABDOMEN ACUTE W/ 1V CHEST COMPARISON:  Chest x-ray of November 15, 2016 FINDINGS: The lungs are hypoinflated. The hemidiaphragms are elevated due to of moderately distended gas-filled loops of small and large bowel under the hemidiaphragms. There is left basilar density compatible with atelectasis. The heart is not enlarged. The pulmonary vascularity is not engorged. Within the abdomen there are loops of moderately distended gas-filled bowel present with the largest caliber of a bowel loop  as much as 13 cm. The most distended bowel appears to be colon but some small bowel distention is present. There is stool present within portions of the colon. There is a small amount of rectal gas. No free extraluminal gas collections are observed. There is degenerative change of the lumbar spine with scoliosis convex toward the right. IMPRESSION: Distension of small and large bowel loops within the abdomen. One cannot exclude sigmoid volvulus in the appropriate setting. Surgical consultation and  abdominal and pelvic CT scanning would be useful. Electronically Signed   By: David  Martinique M.D.   On: 05/30/2017 15:14    Procedures Procedures (including critical care time)  Medications Ordered in ED Medications  0.9 %  sodium chloride infusion (not administered)  morphine 4 MG/ML injection 4 mg (not administered)  sodium chloride 0.9 % bolus 500 mL (0 mLs Intravenous Stopped 05/30/17 1702)  fentaNYL (SUBLIMAZE) injection 25 mcg (25 mcg Intravenous Given 05/30/17 1623)  iopamidol (ISOVUE-300) 61 % injection (100 mLs  Contrast Given 05/30/17 1634)   CRITICAL CARE: sigmoid volvulus Performed by: Alvino Chapel   Total critical care time: 30 minutes  Critical care time was exclusive of separately billable procedures and treating other patients.  Critical care was necessary to treat or prevent imminent or life-threatening deterioration.  Critical care was time spent personally by me on the following activities: development of treatment plan with patient and/or surrogate as well as nursing, discussions with consultants, evaluation of patient's response to treatment, examination of patient, obtaining history from patient or surrogate, ordering and performing treatments and interventions, ordering and review of laboratory studies, ordering and review of radiographic studies, pulse oximetry and re-evaluation of patient's condition.   Initial Impression / Assessment and Plan / ED Course  I  have reviewed the triage vital signs and the nursing notes.  Pertinent labs & imaging results that were available during my care of the patient were reviewed by me and considered in my medical decision making (see chart for details).    83 year old male with a history of pulmonary embolus on Coumadin, coronary disease, hypertension, hyperlipidemia, Parkinson's disease, dementia, prostate cancer, prior sigmoid volvulus in November 2017 corrected by flexible sigmoidoscopy x2 who presents with concern for abdominal distention and decreased appetite. Ordered XR/CT/lab work.  Clinical concern for volvulus given patient's history and XR performed showing significant distention.  Called General Surgery who came to evaluate the patient at the bedside.  Discussed with Dr. Paulita Fujita, who recommends CT completion and re-consulting Gastroenterology after it is complete.  Care signed out to Dr. Johnney Killian with repeat GI consult and CT results pending.   Final Clinical Impressions(s) / ED Diagnoses   Final diagnoses:  Generalized abdominal pain  Volvulus ([place])    New Prescriptions New Prescriptions   No medications on file     Gareth Morgan, MD 05/30/17 1721    Gareth Morgan, MD 05/31/17 [phone]

## 2017-05-30 NOTE — H&P (Signed)
Date: 05/30/2017               Patient Name:  [name] [name] MRN: [id number]  DOB: 04-02-1931 Age / Sex: 83 y.o., male   PCP: [name], DO         Medical Service: Internal Medicine Teaching Service         Attending Physician: Dr. Charlesetta Shanks, MD    First Contact: Dr. Johny Chess Pager: [phone]  Second Contact: Dr. Reesa Chew Pager: [phone]       After Hours (After 5p/  First Contact Pager: [phone]  weekends / holidays): Second Contact Pager: [phone]   Chief Complaint: Abd distension  History of Present Illness: Mr. Pullara is an 83 y.o m with hx of htn, parkinson's disease, prior sigmoid volvulus which was corrected by flexible sigmoidoscopy x2, prostate cancer, previous pulmonary embolus on coumadin, and CAD who presented for abdominal distension and decreased appetite. The patient was asleep when we saw him and therefore history was obtained due to chart review.   The patient presented with abdominal distention, decreased appetite, nausea, and constipation. His daughter mentioned that he has not had any bowel movements the day of admission nor passed flatus. He did have some clear mucus discharge from his rectum, however.  He felt that the symptoms he is experiencing now are similar to how he was feeling during his previous volvulus episode. He was diagnosed with sigmoid volvulus in November 2017 during which time he underwent a sigmoid decompression x2.   The patient has not had his home dose of coumadin in few days although the INR was normal at 1.54.  ED Course: CT abdomen, Gastrology and general surgery consulted for possible volvulus  Meds:  No outpatient prescriptions have been marked as taking for the 05/30/17 encounter [facility] Encounter).     Allergies: Allergies as of 05/30/2017 - Review Complete 05/30/2017  Allergen Reaction Noted  . Aricept [donepezil hcl] Other (See Comments) 07/24/2013  . Other Other (See Comments) 08/17/2011  . Shellfish-derived  products Other (See Comments) 01/21/2016   Past Medical History:  Diagnosis Date  . Anxiety   . Coronary artery disease, non-occlusive    with history of MI; Cath 2008 w multivessel nonobstructive CAD  . Dementia   . Depression   . GERD (gastroesophageal reflux disease)   . Hyperlipidemia   . Hypertension   . Memory loss   . Parkinson disease ([place])   . PE (pulmonary embolism)    unprovoked PE completed 6 months of warfarin, warfarin d/ced 02/12/2010, repeat PE 07/10/2010 after a long car ride and now on lifelong coumadin.  . Pituitary microadenoma ([place])    incidental finding CT 12/2009  . Prostate cancer ([place])   . Scoliosis   . Sigmoid volvulus ([place]) 08/02/2016    Family History: HTN-father, child Dementia-mother   Social History:  Has not smoked, drank etoh, or used illicit drugs.    Review of Systems: A complete ROS was negative except as per HPI.   Physical Exam: Blood pressure (!) 157/101, pulse 65, temperature 98.4 F (36.9 C), temperature source Oral, resp. rate 16, height 5\' 6"  (1.676 m), SpO2 93 %.  Physical Exam  Constitutional: He appears well-developed and well-nourished. No distress.  HENT:  Head: Normocephalic and atraumatic.  Eyes: Pupils are equal, round, and reactive to light.  Cardiovascular: Normal rate, regular rhythm, normal heart sounds and intact distal pulses.   Pulmonary/Chest: Effort normal and breath sounds normal. No respiratory distress. He has no  wheezes.  Auscultation in supine position  Abdominal: Soft. Bowel sounds are normal. He exhibits no distension and no mass. There is no tenderness.  Neurological:  Was asleep on exam    EKG: Normal sinus rhythm, Possible Left atrial enlargement, Right bundle branch block Left anterior fascicular block  Sigmoidoscopy: congested and erythematous mucosa in the sigmoid colon with upstream dilatation consistent with sigmoid volvulus.   Abd X-ray w/ chest: Distension of small and large bowel  loops within the abdomen. One cannot exclude sigmoid volvulus in the appropriate setting. Surgical consultation and abdominal and pelvic CT scanning would be useful.  CT Abdomen Pelvis: 1. CT findings consistent with a sigmoid volvulus. Very similar findings to the prior CT scan from November 2017. 2. No other significant abdominal/pelvic findings. 3. Bibasilar scarring changes and atelectasis.  Repeat Xray abd: Decompression of sigmoid volvulus. No findings to suggest perforation.  Assessment & Plan by Problem:  Sigmoid Volvulus s/p sigmoidoscopic decompression The patient has sigmoid volvulus as seen on imaging. The sigmoid volvulus occurred most likely due to his constipation and atony. The patient being male also predisposes him as males are thought to have longer sigmoid colons. The patient was seen by gastroenterology and sigmoidoscopic decompression was done.  -Serial KUBs -Morphine for pain -0.9% nacl infusion -Need to monitor for recurrent distension and volvulus. No need to do sigmoid resection as definitive management at this time due to patient's age, low recurrence since previous episode and risky procedure.  -Placed on heparin in case surgery is necessary  Essential Hypertension The patient's blood pressure has been ranging 121-231/80-103.  -Continue amlodipine 2.5mg  qd  Parkinson's Disease -Continued home carbidopa-levodopa, memantadine  Hyperlipidemia -Continue home pravastatin  Dispo: Admit patient to Inpatient with expected length of stay greater than 2 midnights.  Signed: Lars Mage, MD Internal Medicine PGY1 Pager:[phone] 05/31/2017, 2:13 AM

## 2017-05-30 NOTE — Op Note (Signed)
[facility] Patient Name: [name] [name] Procedure Date : 05/30/2017 MRN: [id number] Attending MD: Ronald Lobo , MD Date of Birth: Apr 23, 1931 CSN: [id number] Age: 83 Admit Type: Outpatient Procedure:                Flexible Sigmoidoscopy Indications:              Abdominal distension, with abnormal CT of the GI                            tract showing recurrent sigmoid volvulus (last                            occurred 07/2016). Providers:                Ronald Lobo, MD, Burtis Junes, RN, Marcene Duos,                            Technician Referring MD:              Medicines:                None Complications:            No immediate complications. Estimated Blood Loss:     Estimated blood loss: none. Procedure:                Pre-Anesthesia Assessment:                           - Prior to the procedure, a History and Physical                            was performed, and patient medications and                            allergies were reviewed. The patient's tolerance of                            previous anesthesia was also reviewed. The risks                            and benefits of the procedure and the sedation                            options and risks were discussed with the patient's                            family on his prior exam last year. All questions                            were answered, and informed consent was obtained.                            Prior Anticoagulants: The patient has taken                            Coumadin (warfarin), last  dose was 4 days prior to                            procedure. ASA Grade Assessment: [name] - A patient                            with severe systemic disease. After reviewing the                            risks and benefits, the patient was deemed in                            satisfactory condition to undergo the procedure.                           After obtaining informed consent, the scope was                             passed under direct vision. The [id number] ([id number])                            scope was introduced through the anus and advanced                            to the transverse colon (approx 120 cm of                            insertion). The flexible sigmoidoscopy was                            accomplished without difficulty. The patient                            tolerated the procedure well. The quality of the                            bowel preparation was adequate. Scope In: 8:00:34 PM Scope Out: 8:10:53 PM Total Procedure Duration: 0 hours 10 minutes 19 seconds  Findings:      The digital rectal exam findings include surgically absent prostate.      A segmental area of mildly congested and erythematous mucosa was found       in the sigmoid colon, where there was a torsion point that was easily       traversed, above which the colon was significantly dilated.      The exam was otherwise without abnormality--no polyps, masses, extensive       colitis, gangrene, or other significant abnormalities seen. Impression:               - A surgically absent prostate found on digital                            rectal exam.                           - Congested  and erythematous mucosa in the sigmoid                            colon with upstream dilatation consistent with a                            sigmoid volvulus, probably associated with                            underlying colonic atony/dysmotility.                           - The examination was otherwise normal.                           - No specimens collected. Recommendation:           - Admit the patient to hospital ward for                            observation.                           - Repeat KUB tonight and in the morning.                           - Because this patient probably has colonic atony                            contributing to his colonic distension (which may                             be what caused his volvulus to occur), it is not                            necessarily the case that we are out of the woods,                            despite correcting the volvulus (as appears to have                            been accomplished this evening). He is at risk for                            recurrent distension and volvulus, so I feel                            in-house clinical and radiographic followup is                            warranted. Procedure Code(s):        --- Professional ---                           [phone], Sigmoidoscopy, flexible; diagnostic,  including collection of specimen(s) by brushing or                            washing, when performed (separate procedure) Diagnosis Code(s):        --- Professional ---                           K63.89, Other specified diseases of intestine CPT copyright 2016 [employer]. All rights reserved. The codes documented in this report are preliminary and upon coder review may  be revised to meet current compliance requirements. Ronald Lobo, MD 05/30/2017 8:34:57 PM This report has been signed electronically. Number of Addenda: 0

## 2017-05-31 ENCOUNTER — Encounter (HOSPITAL_COMMUNITY): Payer: Self-pay | Admitting: Gastroenterology

## 2017-05-31 DIAGNOSIS — Z8546 Personal history of malignant neoplasm of prostate: Secondary | ICD-10-CM | POA: Diagnosis not present

## 2017-05-31 DIAGNOSIS — I2782 Chronic pulmonary embolism: Secondary | ICD-10-CM | POA: Diagnosis not present

## 2017-05-31 DIAGNOSIS — G219 Secondary parkinsonism, unspecified: Secondary | ICD-10-CM | POA: Diagnosis not present

## 2017-05-31 DIAGNOSIS — I5022 Chronic systolic (congestive) heart failure: Secondary | ICD-10-CM | POA: Diagnosis present

## 2017-05-31 DIAGNOSIS — I110 Hypertensive heart disease with heart failure: Secondary | ICD-10-CM | POA: Diagnosis present

## 2017-05-31 DIAGNOSIS — M419 Scoliosis, unspecified: Secondary | ICD-10-CM | POA: Diagnosis present

## 2017-05-31 DIAGNOSIS — F419 Anxiety disorder, unspecified: Secondary | ICD-10-CM | POA: Diagnosis not present

## 2017-05-31 DIAGNOSIS — R400 Somnolence: Secondary | ICD-10-CM | POA: Diagnosis not present

## 2017-05-31 DIAGNOSIS — Z79899 Other long term (current) drug therapy: Secondary | ICD-10-CM | POA: Diagnosis not present

## 2017-05-31 DIAGNOSIS — E876 Hypokalemia: Secondary | ICD-10-CM | POA: Diagnosis not present

## 2017-05-31 DIAGNOSIS — Z91013 Allergy to seafood: Secondary | ICD-10-CM | POA: Diagnosis not present

## 2017-05-31 DIAGNOSIS — F0280 Dementia in other diseases classified elsewhere without behavioral disturbance: Secondary | ICD-10-CM | POA: Diagnosis present

## 2017-05-31 DIAGNOSIS — K625 Hemorrhage of anus and rectum: Secondary | ICD-10-CM | POA: Diagnosis present

## 2017-05-31 DIAGNOSIS — M1991 Primary osteoarthritis, unspecified site: Secondary | ICD-10-CM | POA: Diagnosis present

## 2017-05-31 DIAGNOSIS — K5939 Other megacolon: Secondary | ICD-10-CM | POA: Diagnosis present

## 2017-05-31 DIAGNOSIS — K649 Unspecified hemorrhoids: Secondary | ICD-10-CM | POA: Diagnosis present

## 2017-05-31 DIAGNOSIS — Z7901 Long term (current) use of anticoagulants: Secondary | ICD-10-CM | POA: Diagnosis not present

## 2017-05-31 DIAGNOSIS — G8929 Other chronic pain: Secondary | ICD-10-CM | POA: Diagnosis present

## 2017-05-31 DIAGNOSIS — G20 Parkinson's disease: Secondary | ICD-10-CM

## 2017-05-31 DIAGNOSIS — K562 Volvulus: Secondary | ICD-10-CM | POA: Diagnosis present

## 2017-05-31 DIAGNOSIS — R55 Syncope and collapse: Secondary | ICD-10-CM | POA: Diagnosis not present

## 2017-05-31 DIAGNOSIS — F0390 Unspecified dementia without behavioral disturbance: Secondary | ICD-10-CM | POA: Diagnosis not present

## 2017-05-31 DIAGNOSIS — Z7189 Other specified counseling: Secondary | ICD-10-CM | POA: Diagnosis not present

## 2017-05-31 DIAGNOSIS — D352 Benign neoplasm of pituitary gland: Secondary | ICD-10-CM | POA: Diagnosis present

## 2017-05-31 DIAGNOSIS — D649 Anemia, unspecified: Secondary | ICD-10-CM | POA: Diagnosis not present

## 2017-05-31 DIAGNOSIS — K5730 Diverticulosis of large intestine without perforation or abscess without bleeding: Secondary | ICD-10-CM | POA: Diagnosis present

## 2017-05-31 DIAGNOSIS — Z7982 Long term (current) use of aspirin: Secondary | ICD-10-CM | POA: Diagnosis not present

## 2017-05-31 DIAGNOSIS — I252 Old myocardial infarction: Secondary | ICD-10-CM | POA: Diagnosis not present

## 2017-05-31 DIAGNOSIS — I2510 Atherosclerotic heart disease of native coronary artery without angina pectoris: Secondary | ICD-10-CM | POA: Diagnosis present

## 2017-05-31 DIAGNOSIS — F329 Major depressive disorder, single episode, unspecified: Secondary | ICD-10-CM | POA: Diagnosis not present

## 2017-05-31 DIAGNOSIS — E785 Hyperlipidemia, unspecified: Secondary | ICD-10-CM | POA: Diagnosis present

## 2017-05-31 DIAGNOSIS — Z515 Encounter for palliative care: Secondary | ICD-10-CM | POA: Diagnosis not present

## 2017-05-31 DIAGNOSIS — M549 Dorsalgia, unspecified: Secondary | ICD-10-CM | POA: Diagnosis present

## 2017-05-31 DIAGNOSIS — G309 Alzheimer's disease, unspecified: Secondary | ICD-10-CM | POA: Diagnosis present

## 2017-05-31 DIAGNOSIS — R1084 Generalized abdominal pain: Secondary | ICD-10-CM | POA: Diagnosis not present

## 2017-05-31 DIAGNOSIS — I700 Atherosclerosis of aorta: Secondary | ICD-10-CM | POA: Diagnosis present

## 2017-05-31 DIAGNOSIS — R140 Abdominal distension (gaseous): Secondary | ICD-10-CM | POA: Diagnosis not present

## 2017-05-31 LAB — CBC
HEMATOCRIT: 37.6 % — AB (ref 39.0–52.0)
HEMOGLOBIN: 11.6 g/dL — AB (ref 13.0–17.0)
MCH: 30 pg (ref 26.0–34.0)
MCHC: 30.9 g/dL (ref 30.0–36.0)
MCV: 97.2 fL (ref 78.0–100.0)
Platelets: 148 10*3/uL — ABNORMAL LOW (ref 150–400)
RBC: 3.87 MIL/uL — AB (ref 4.22–5.81)
RDW: 15.3 % (ref 11.5–15.5)
WBC: 3.6 10*3/uL — ABNORMAL LOW (ref 4.0–10.5)

## 2017-05-31 LAB — PROTIME-INR
INR: 1.56
Prothrombin Time: 18.5 seconds — ABNORMAL HIGH (ref 11.4–15.2)

## 2017-05-31 LAB — BASIC METABOLIC PANEL
Anion gap: 7 (ref 5–15)
BUN: 10 mg/dL (ref 6–20)
CO2: 24 mmol/L (ref 22–32)
Calcium: 8.3 mg/dL — ABNORMAL LOW (ref 8.9–10.3)
Chloride: 112 mmol/L — ABNORMAL HIGH (ref 101–111)
Creatinine, Ser: 0.94 mg/dL (ref 0.61–1.24)
GFR calc Af Amer: >60 mL/min (ref >60)
GFR calc non Af Amer: >60 mL/min (ref >60)
Glucose, Bld: 75 mg/dL (ref 65–99)
Potassium: 3.4 mmol/L — ABNORMAL LOW (ref 3.5–5.1)
Sodium: 143 mmol/L (ref 135–145)

## 2017-05-31 LAB — HEPARIN LEVEL (UNFRACTIONATED)
HEPARIN UNFRACTIONATED: 0.58 [IU]/mL (ref 0.30–0.70)
Heparin Unfractionated: 0.88 IU/mL — ABNORMAL HIGH (ref 0.30–0.70)

## 2017-05-31 MED ORDER — POTASSIUM CHLORIDE CRYS ER 20 MEQ PO TBCR
40.0000 meq | EXTENDED_RELEASE_TABLET | Freq: Once | ORAL | Status: AC
  Administered 2017-05-31: 40 meq via ORAL
  Filled 2017-05-31: qty 2

## 2017-05-31 MED ORDER — POLYETHYLENE GLYCOL 3350 17 G PO PACK
17.0000 g | PACK | Freq: Every day | ORAL | Status: DC
  Administered 2017-05-31 – 2017-06-14 (×12): 17 g via ORAL
  Filled 2017-05-31 (×15): qty 1

## 2017-05-31 MED ORDER — ACETAMINOPHEN 325 MG PO TABS
650.0000 mg | ORAL_TABLET | Freq: Four times a day (QID) | ORAL | Status: DC | PRN
Start: 2017-05-31 — End: 2017-06-09
  Administered 2017-06-01 – 2017-06-07 (×5): 650 mg via ORAL
  Filled 2017-05-31 (×5): qty 2

## 2017-05-31 MED ORDER — ENOXAPARIN SODIUM 40 MG/0.4ML ~~LOC~~ SOLN: 40.0000 mg | SUBCUTANEOUS | Status: DC

## 2017-05-31 MED ORDER — QUETIAPINE FUMARATE 50 MG PO TABS
25.0000 mg | ORAL_TABLET | Freq: Every day | ORAL | Status: DC
  Administered 2017-05-31 – 2017-06-05 (×5): 25 mg via ORAL
  Filled 2017-05-31 (×5): qty 1

## 2017-05-31 MED ORDER — MEMANTINE HCL ER 28 MG PO CP24
28.0000 mg | ORAL_CAPSULE | ORAL | Status: DC
  Filled 2017-05-31: qty 1

## 2017-05-31 MED ORDER — PRAVASTATIN SODIUM 10 MG PO TABS
10.0000 mg | ORAL_TABLET | Freq: Every day | ORAL | Status: DC
  Administered 2017-05-31 – 2017-06-14 (×13): 10 mg via ORAL
  Filled 2017-05-31 (×13): qty 1

## 2017-05-31 MED ORDER — WARFARIN - PHARMACIST DOSING INPATIENT: Freq: Every day | Status: DC

## 2017-05-31 MED ORDER — POLYETHYLENE GLYCOL 3350 17 G PO PACK: 17.0000 g | PACK | Freq: Every day | ORAL | Status: DC | PRN

## 2017-05-31 MED ORDER — ACETAMINOPHEN 650 MG RE SUPP
650.0000 mg | Freq: Four times a day (QID) | RECTAL | Status: DC | PRN
  Administered 2017-06-01 – 2017-06-02 (×2): 650 mg via RECTAL
  Filled 2017-05-31 (×3): qty 1

## 2017-05-31 MED ORDER — AMLODIPINE BESYLATE 2.5 MG PO TABS
2.5000 mg | ORAL_TABLET | Freq: Every day | ORAL | Status: DC
  Administered 2017-05-31 – 2017-06-11 (×10): 2.5 mg via ORAL
  Filled 2017-05-31 (×10): qty 1

## 2017-05-31 MED ORDER — HEPARIN (PORCINE) IN NACL 100-0.45 UNIT/ML-% IJ SOLN
850.0000 [IU]/h | INTRAMUSCULAR | Status: DC
  Administered 2017-05-31: 1000 [IU]/h via INTRAVENOUS
  Administered 2017-06-01: 850 [IU]/h via INTRAVENOUS
  Filled 2017-05-31 (×2): qty 250

## 2017-05-31 MED ORDER — WARFARIN SODIUM 5 MG PO TABS: 5.0000 mg | ORAL_TABLET | Freq: Once | ORAL | Status: DC

## 2017-05-31 MED ORDER — MEMANTINE HCL ER 28 MG PO CP24
28.0000 mg | ORAL_CAPSULE | Freq: Every day | ORAL | Status: DC
  Administered 2017-05-31 – 2017-06-14 (×13): 28 mg via ORAL
  Filled 2017-05-31 (×17): qty 1

## 2017-05-31 MED ORDER — SODIUM CHLORIDE 0.9 % IV SOLN
Freq: Once | INTRAVENOUS | Status: AC
  Administered 2017-05-31: 03:00:00 via INTRAVENOUS

## 2017-05-31 MED ORDER — CARBIDOPA-LEVODOPA 25-100 MG PO TABS
1.0000 | ORAL_TABLET | Freq: Four times a day (QID) | ORAL | Status: DC
  Administered 2017-05-31 – 2017-06-14 (×50): 1 via ORAL
  Filled 2017-05-31 (×49): qty 1

## 2017-05-31 NOTE — Progress Notes (Signed)
Subjective: Denies abdominal pain  Objective: Vital signs in last 24 hours: Temp:  [98.2 F (36.8 C)-98.4 F (36.9 C)] 98.2 F (36.8 C) (09/11 0521) Pulse Rate:  [59-93] 66 (09/11 0521) Resp:  [12-36] 17 (09/11 0521) BP: (121-231)/(78-137) 122/88 (09/11 0521) SpO2:  [91 %-100 %] 97 % (09/11 0521) Weight:  [149 lb (67.6 kg)] 149 lb (67.6 kg) (09/11 0202) Weight change:     PE: GEN:  Awake, can't answer most questions appropriately, NAD ABD:  Mild distention, non-tender, no peritonitis  Lab Results: CBC    Component Value Date/Time   WBC 3.6 (L) 05/31/2017 0312   RBC 3.87 (L) 05/31/2017 0312   HGB 11.6 (L) 05/31/2017 0312   HGB 13.1 12/09/2016 1114   HCT 37.6 (L) 05/31/2017 0312   HCT 40.8 12/09/2016 1114   PLT 148 (L) 05/31/2017 0312   PLT 186 12/09/2016 1114   MCV 97.2 05/31/2017 0312   MCV 91 12/09/2016 1114   MCH 30.0 05/31/2017 0312   MCHC 30.9 05/31/2017 0312   RDW 15.3 05/31/2017 0312   RDW 16.3 (H) 12/09/2016 1114   LYMPHSABS 1.2 05/30/2017 1403   MONOABS 0.3 05/30/2017 1403   EOSABS 0.1 05/30/2017 1403   BASOSABS 0.0 05/30/2017 1403   CMP     Component Value Date/Time   NA 143 05/31/2017 0312   NA 145 (H) 02/17/2017 1204   K 3.4 (L) 05/31/2017 0312   CL 112 (H) 05/31/2017 0312   CO2 24 05/31/2017 0312   GLUCOSE 75 05/31/2017 0312   BUN 10 05/31/2017 0312   BUN 14 02/17/2017 1204   CREATININE 0.94 05/31/2017 0312   CREATININE 0.96 12/23/2014 1214   CALCIUM 8.3 (L) 05/31/2017 0312   PROT 6.8 05/30/2017 1403   PROT 6.7 02/17/2017 1204   ALBUMIN 3.7 05/30/2017 1403   ALBUMIN 4.2 02/17/2017 1204   AST 30 05/30/2017 1403   ALT 6 (L) 05/30/2017 1403   ALKPHOS 37 (L) 05/30/2017 1403   BILITOT 0.7 05/30/2017 1403   BILITOT 0.3 02/17/2017 1204   GFRNONAA >60 05/31/2017 0312   GFRNONAA 74 12/23/2014 1214   GFRAA >60 05/31/2017 0312   GFRAA 85 12/23/2014 1214   Assessment:  1.  Sigmoid volvulus, successful endoscopic decompression.  Plan:  1.   Ok to try clear liquids today. 2.  This is challenging situation:  The volvulus will almost assuredly recur at some point without surgery, but given patient's comorbidities surgery will not be without it's own risks as well. 3.  Would appreciate surgical input regarding whether or not surgery is feasible in this situation. 4.  Will follow.   [name] 05/31/2017, 10:41 AM   Cell [phone] If no answer or after 5 PM call [phone]

## 2017-05-31 NOTE — Progress Notes (Signed)
   Subjective: Admitted overnight, underwent successful flex sig with volvulus reduction. This morning, no family is present in the room and the pt is unable to provide subjective updates given his dementia.   Objective:  Vital signs in last 24 hours: Vitals:   05/30/17 2250 05/31/17 0107 05/31/17 0202 05/31/17 0521  BP: (!) 157/101 121/80 126/78 122/88  Pulse: 65 (!) 59  66  Resp: 16 19 18 17   Temp:   98.4 F (36.9 C) 98.2 F (36.8 C)  TempSrc:   Axillary Oral  SpO2: 93% 94% 97% 97%  Weight:   149 lb (67.6 kg)   Height:   5\' 6"  (1.676 m)    Physical Exam  Constitutional:  Elderly gentleman laying in bed, no acute distress   HENT:  Head: Normocephalic and atraumatic.  Cardiovascular: Normal rate and regular rhythm.   Pulmonary/Chest: Effort normal. No respiratory distress.  Abdominal:  Soft, BS +, no tenderness/grimacing with palpation   Musculoskeletal:  UE rigidity   Neurological:  Responsive with yes/no, otherwise inappropriate answers and not oriented-c/w baseline      Assessment/Plan:  Sigmoid Volvulus Pt has a history of sigmoid volvulus in 07/2016, presented with abdominal distension, constipation and found to have recurrent volvulus on CT. GI was consulted and he underwent flexible sigmoidoscopy with successful decompression. General surgery was also consulted, though family is hesitant for surgical management given age, comorbidities. --Monitor vital signs, abdominal exam  --F/u GI, Surgery recommendations --Clear liquid diet    History of PE  Pt on chronic warfarin for recurrent PE. INR at presentation 1.54 in setting of reported missed doses at home. Placed on heparin gtt in the setting of potential procedural interventions.  --Continue Heparin gtt per pharmacy for now   Parkinson's Disease, Dementia  Pt with a history of advanced dementia and Parkinson's disease, low functional status and minimally interactive at baseline.  --Continue home memantine,  Sinemet    Dispo: Anticipated discharge in approximately 2-3 day(s).   Tawny Asal, MD 05/31/2017, 10:32 AM Pager: [phone]

## 2017-05-31 NOTE — Progress Notes (Signed)
[facility] for heparin Indication: h/o pulmonary embolus  Allergies  Allergen Reactions  . Aricept [Donepezil Hcl] Other (See Comments)    Symptomatic Bradycardia.  . Other Other (See Comments)    Shrimp gives him gout  . Shellfish-Derived Products Other (See Comments)    Causes a flare up with Gout    Vital Signs: Temp: 98.2 F (36.8 C) (09/11 0521) Temp Source: Oral (09/11 0521) BP: 122/88 (09/11 0521) Pulse Rate: 66 (09/11 0521)  Labs:  Recent Labs  05/30/17 1403 05/31/17 0312 05/31/17 1049  HGB 13.5 11.6*  --   HCT 42.5 37.6*  --   PLT 160 148*  --   LABPROT 18.4* 18.5*  --   INR 1.54 1.56  --   HEPARINUNFRC  --   --  0.58  CREATININE 1.03 0.94  --      Medical History: Past Medical History:  Diagnosis Date  . Anxiety   . Coronary artery disease, non-occlusive    with history of MI; Cath 2008 w multivessel nonobstructive CAD  . Dementia   . Depression   . GERD (gastroesophageal reflux disease)   . Hyperlipidemia   . Hypertension   . Memory loss   . Parkinson disease ([place])   . PE (pulmonary embolism)    unprovoked PE completed 6 months of warfarin, warfarin d/ced 02/12/2010, repeat PE 07/10/2010 after a long car ride and now on lifelong coumadin.  . Pituitary microadenoma ([place])    incidental finding CT 12/2009  . Prostate cancer ([place])   . Scoliosis   . Sigmoid volvulus ([place]) 08/02/2016    Medications:  Prescriptions Prior to Admission  Medication Sig Dispense Refill Last Dose  . acetaminophen (TYLENOL) 500 MG tablet Take 1,000 mg by mouth 2 (two) times daily.    05/30/2017 at unk  . amLODipine (NORVASC) 2.5 MG tablet Take 1 tablet (2.5 mg total) by mouth daily. 90 tablet 3 05/30/2017 at Unknown time  . aspirin 81 MG chewable tablet Chew 81 mg by mouth daily as needed (chest pain).    Past Month at Unknown time  . b complex vitamins tablet Take 1 tablet by mouth every morning.    05/30/2017 at Unknown time  . Ca  Phosphate-Cholecalciferol (CALCIUM/VITAMIN D3 GUMMIES) 250-350 MG-UNIT CHEW Chew 1 Dose by mouth 2 (two) times daily. 180 tablet 3 05/30/2017 at Unknown time  . carbidopa-levodopa (SINEMET IR) 25-100 MG tablet Take 1 tablet by mouth 4 (four) times daily. 120 tablet 2 05/30/2017 at Unknown time  . fish oil-omega-3 fatty acids 1000 MG capsule Take 2 g by mouth every morning.    05/30/2017 at Unknown time  . memantine (NAMENDA XR) 28 MG CP24 24 hr capsule Take 1 capsule (28 mg total) by mouth every morning. 90 capsule 3 05/30/2017 at Unknown time  . Multiple Vitamins-Minerals (MULTIVITAMIN WITH MINERALS) tablet Take 1 tablet by mouth every morning.    05/30/2017 at Unknown time  . polyethylene glycol (MIRALAX / GLYCOLAX) packet Take 17 g by mouth daily as needed. (Patient taking differently: Take 17 g by mouth daily as needed for mild constipation. ) 14 each 1 Past Month at Unknown time  . potassium chloride SA (KLOR-CON M20) 20 MEQ tablet Take 2 tablets (40 mEq total) by mouth daily. 90 tablet 1 05/30/2017 at Unknown time  . pravastatin (PRAVACHOL) 10 MG tablet Take 1 tablet (10 mg total) by mouth daily. 90 tablet 3 05/30/2017 at Unknown time  . QUEtiapine (SEROQUEL) 25 MG  tablet Take 1 tablet (25 mg total) by mouth at bedtime. May use half a tablet 12.5 mg during the daytime as needed for agitation/hallucinations 90 tablet 4 05/30/2017 at Unknown time  . senna (SENOKOT) 8.6 MG TABS tablet Take 1 tablet (8.6 mg total) by mouth daily as needed for mild constipation. 120 each 0 Past Month at Unknown time  . vitamin C (ASCORBIC ACID) 500 MG tablet Take 1,000 mg by mouth every morning.    05/30/2017 at Unknown time  . warfarin (COUMADIN) 5 MG tablet Take 0.5-1 tablets (2.5-5 mg total) by mouth See admin instructions. Take 1 tablet on Mon, and Fri. Take 1/2 tablet all other days (Patient taking differently: Take 2.5-5 mg by mouth See admin instructions. 5mg  Mon, Wed - 2.5mg  all other days) 30 tablet 3 05/29/2017 at 1900    Scheduled:  . amLODipine  2.5 mg Oral Daily  . carbidopa-levodopa  1 tablet Oral QID  . memantine  28 mg Oral Daily  . pravastatin  10 mg Oral Daily    Assessment: 83yo male admitted w/ recurrent sigmoid volvulus, to transition from warfarin to heparin for h/o PE; current INR 1.56. Heparin level is therapeutic.   PTA warfarin: 5 mg Mon/Wed, 2.5 mg all other days  Goal of Therapy:  Heparin level 0.3-0.7 units/ml    Plan:  -Continue heparin at 1000 units/hr -Daily HL, CBC -F/u plan for warfarin -Check heparin level this afternoon   Hughes Better, PharmD, BCPS Clinical Pharmacist 05/31/2017 11:58 AM

## 2017-05-31 NOTE — Progress Notes (Signed)
[facility] for heparin Indication: h/o pulmonary embolus  Allergies  Allergen Reactions  . Aricept [Donepezil Hcl] Other (See Comments)    Symptomatic Bradycardia.  . Other Other (See Comments)    Shrimp gives him gout  . Shellfish-Derived Products Other (See Comments)    Causes a flare up with Gout    Vital Signs: Temp: 98.1 F (36.7 C) (09/11 1350) Temp Source: Oral (09/11 1350) BP: 140/84 (09/11 1350) Pulse Rate: 67 (09/11 1350)  Labs:  Recent Labs  05/30/17 1403 05/31/17 0312 05/31/17 1049 05/31/17 1921  HGB 13.5 11.6*  --   --   HCT 42.5 37.6*  --   --   PLT 160 148*  --   --   LABPROT 18.4* 18.5*  --   --   INR 1.54 1.56  --   --   HEPARINUNFRC  --   --  0.58 0.88*  CREATININE 1.03 0.94  --   --      Medical History: Past Medical History:  Diagnosis Date  . Anxiety   . Coronary artery disease, non-occlusive    with history of MI; Cath 2008 w multivessel nonobstructive CAD  . Dementia   . Depression   . GERD (gastroesophageal reflux disease)   . Hyperlipidemia   . Hypertension   . Memory loss   . Parkinson disease ([place])   . PE (pulmonary embolism)    unprovoked PE completed 6 months of warfarin, warfarin d/ced 02/12/2010, repeat PE 07/10/2010 after a long car ride and now on lifelong coumadin.  . Pituitary microadenoma ([place])    incidental finding CT 12/2009  . Prostate cancer ([place])   . Scoliosis   . Sigmoid volvulus ([place]) 08/02/2016    Medications:  Prescriptions Prior to Admission  Medication Sig Dispense Refill Last Dose  . acetaminophen (TYLENOL) 500 MG tablet Take 1,000 mg by mouth 2 (two) times daily.    05/30/2017 at unk  . amLODipine (NORVASC) 2.5 MG tablet Take 1 tablet (2.5 mg total) by mouth daily. 90 tablet 3 05/30/2017 at Unknown time  . aspirin 81 MG chewable tablet Chew 81 mg by mouth daily as needed (chest pain).    Past Month at Unknown time  . b complex vitamins tablet Take 1 tablet by mouth  every morning.    05/30/2017 at Unknown time  . Ca Phosphate-Cholecalciferol (CALCIUM/VITAMIN D3 GUMMIES) 250-350 MG-UNIT CHEW Chew 1 Dose by mouth 2 (two) times daily. 180 tablet 3 05/30/2017 at Unknown time  . carbidopa-levodopa (SINEMET IR) 25-100 MG tablet Take 1 tablet by mouth 4 (four) times daily. 120 tablet 2 05/30/2017 at Unknown time  . fish oil-omega-3 fatty acids 1000 MG capsule Take 2 g by mouth every morning.    05/30/2017 at Unknown time  . memantine (NAMENDA XR) 28 MG CP24 24 hr capsule Take 1 capsule (28 mg total) by mouth every morning. 90 capsule 3 05/30/2017 at Unknown time  . Multiple Vitamins-Minerals (MULTIVITAMIN WITH MINERALS) tablet Take 1 tablet by mouth every morning.    05/30/2017 at Unknown time  . polyethylene glycol (MIRALAX / GLYCOLAX) packet Take 17 g by mouth daily as needed. (Patient taking differently: Take 17 g by mouth daily as needed for mild constipation. ) 14 each 1 Past Month at Unknown time  . potassium chloride SA (KLOR-CON M20) 20 MEQ tablet Take 2 tablets (40 mEq total) by mouth daily. 90 tablet 1 05/30/2017 at Unknown time  . pravastatin (PRAVACHOL) 10 MG tablet Take  1 tablet (10 mg total) by mouth daily. 90 tablet 3 05/30/2017 at Unknown time  . QUEtiapine (SEROQUEL) 25 MG tablet Take 1 tablet (25 mg total) by mouth at bedtime. May use half a tablet 12.5 mg during the daytime as needed for agitation/hallucinations 90 tablet 4 05/30/2017 at Unknown time  . senna (SENOKOT) 8.6 MG TABS tablet Take 1 tablet (8.6 mg total) by mouth daily as needed for mild constipation. 120 each 0 Past Month at Unknown time  . vitamin C (ASCORBIC ACID) 500 MG tablet Take 1,000 mg by mouth every morning.    05/30/2017 at Unknown time  . warfarin (COUMADIN) 5 MG tablet Take 0.5-1 tablets (2.5-5 mg total) by mouth See admin instructions. Take 1 tablet on Mon, and Fri. Take 1/2 tablet all other days (Patient taking differently: Take 2.5-5 mg by mouth See admin instructions. 5mg  Mon, Wed -  2.5mg  all other days) 30 tablet 3 05/29/2017 at 1900   Scheduled:  . amLODipine  2.5 mg Oral Daily  . carbidopa-levodopa  1 tablet Oral QID  . memantine  28 mg Oral Daily  . polyethylene glycol  17 g Oral Daily  . pravastatin  10 mg Oral Daily  . QUEtiapine  25 mg Oral QHS    Assessment: 83yo male admitted w/ recurrent sigmoid volvulus, to transition from warfarin to heparin for h/o PE; current INR 1.56.   PM heparin level elevated at 0.88  PTA warfarin: 5 mg Mon/Wed, 2.5 mg all other days  Goal of Therapy:  Heparin level 0.3-0.7 units/ml    Plan:  -Decrease heparin to 850 units / hr -Daily HL, CBC -F/u plan for warfarin   Thank you Anette Guarneri, PharmD [phone] 05/31/2017 8:17 PM

## 2017-05-31 NOTE — Progress Notes (Signed)
[facility] Progress Note  1 Day Post-Op  Subjective: CC: sigmoid volvulus No complaints. Daughter at bedside. Patient denies abdominal pain. Tolerating clear liquids. Passing flatus.  Objective: Vital signs in last 24 hours: Temp:  [98.2 F (36.8 C)-98.4 F (36.9 C)] 98.2 F (36.8 C) (09/11 0521) Pulse Rate:  [59-93] 66 (09/11 0521) Resp:  [12-36] 17 (09/11 0521) BP: (121-231)/(78-137) 122/88 (09/11 0521) SpO2:  [91 %-100 %] 97 % (09/11 0521) Weight:  [67.6 kg (149 lb)] 67.6 kg (149 lb) (09/11 0202)    Intake/Output from previous day: 09/10 0701 - 09/11 0700 In: 532.2 [I.V.:32.2; IV Piggyback:500] Out: -  Intake/Output this shift: No intake/output data recorded.  PE: Gen:  Alert, NAD, pleasant Card:  Regular rate and rhythm, pedal pulses 2+ BL Pulm:  Normal effort, clear to auscultation bilaterally Abd: Soft, non-tender, non-distended, bowel sounds present in all 4 quadrants, no HSM, incisions C/D/I Skin: warm and dry, no rashes  Psych: A&Ox3   Lab Results:   Recent Labs  05/30/17 1403 05/31/17 0312  WBC 4.0 3.6*  HGB 13.5 11.6*  HCT 42.5 37.6*  PLT 160 148*   BMET  Recent Labs  05/30/17 1403 05/31/17 0312  NA 142 143  K 3.8 3.4*  CL 110 112*  CO2 22 24  GLUCOSE 125* 75  BUN 12 10  CREATININE 1.03 0.94  CALCIUM 9.1 8.3*   PT/INR  Recent Labs  05/30/17 1403 05/31/17 0312  LABPROT 18.4* 18.5*  INR 1.54 1.56   CMP     Component Value Date/Time   NA 143 05/31/2017 0312   NA 145 (H) 02/17/2017 1204   K 3.4 (L) 05/31/2017 0312   CL 112 (H) 05/31/2017 0312   CO2 24 05/31/2017 0312   GLUCOSE 75 05/31/2017 0312   BUN 10 05/31/2017 0312   BUN 14 02/17/2017 1204   CREATININE 0.94 05/31/2017 0312   CREATININE 0.96 12/23/2014 1214   CALCIUM 8.3 (L) 05/31/2017 0312   PROT 6.8 05/30/2017 1403   PROT 6.7 02/17/2017 1204   ALBUMIN 3.7 05/30/2017 1403   ALBUMIN 4.2 02/17/2017 1204   AST 30 05/30/2017 1403   ALT 6 (L) 05/30/2017  1403   ALKPHOS 37 (L) 05/30/2017 1403   BILITOT 0.7 05/30/2017 1403   BILITOT 0.3 02/17/2017 1204   GFRNONAA >60 05/31/2017 0312   GFRNONAA 74 12/23/2014 1214   GFRAA >60 05/31/2017 0312   GFRAA 85 12/23/2014 1214   Lipase     Component Value Date/Time   LIPASE 27 05/30/2017 1403       Studies/Results: Dg Abdomen 1 View  Result Date: 05/30/2017 CLINICAL DATA:  Status post flexible sigmoidoscopy and attempted decompression of sigmoid volvulus. EXAM: ABDOMEN - 1 VIEW COMPARISON:  Abdominal radiographs and CT scan 05/30/2017 FINDINGS: Significant interval decompression of sigmoid colon distention. The sigmoid measured up to 13 cm and now measures a maximum of 6 cm. No free air is demonstrated. Stool has moved down into the rectum. IMPRESSION: Decompression of sigmoid volvulus. No findings to suggest perforation. Electronically Signed   By: Marijo Sanes M.D.   On: 05/30/2017 21:35   Ct Abdomen Pelvis W Contrast  Result Date: 05/30/2017 CLINICAL DATA:  Abdominal distension.  Possible sigmoid volvulus. EXAM: CT ABDOMEN AND PELVIS WITH CONTRAST TECHNIQUE: Multidetector CT imaging of the abdomen and pelvis was performed using the standard protocol following bolus administration of intravenous contrast. CONTRAST:  100 cc ISOVUE-300 IOPAMIDOL (ISOVUE-300) INJECTION 61% COMPARISON:  08/01/2016 FINDINGS: Lower chest: Degraded by motion  artifact. Subsegmental atelectasis bilaterally can scarring changes. Elevation of both hemidiaphragms. The heart is normal in size. Stable coronary artery calcifications. Hepatobiliary: No focal hepatic lesions or intrahepatic biliary dilatation. The gallbladder is grossly normal. Colonic interposition is noted. Pancreas: Pancreatic atrophy but no mass or inflammation. Spleen: Normal size.  Stable small cysts. Adrenals/Urinary Tract: The adrenal glands and kidneys are unremarkable and stable. Multiple small renal cysts and chronic renal scarring changes. No  hydronephrosis. The bladder is unremarkable. Prior TURP defect. Stomach/Bowel: Markedly distended and tortuous sigmoid colon with swirled appearance of the mid sigmoid colon consistent with a sigmoid volvulus. This looks very similar to the prior CT scan 2017. Large amount of stool is noted in the ascending colon, transverse colon and descending colon. Vascular/Lymphatic: With marked tortuosity of the thoracic aorta. Moderate atherosclerotic calcifications distally and involving the iliac arteries. No mesenteric or retroperitoneal mass or adenopathy. Reproductive: The prostate gland and seminal vesicles are surgically absent. Other: No inguinal mass or adenopathy. No free pelvic fluid collections. Musculoskeletal: Scoliosis and severe degenerative lumbar spondylosis. IMPRESSION: 1. CT findings consistent with a sigmoid volvulus. Very similar findings to the prior CT scan from November 2017. 2. No other significant abdominal/pelvic findings. 3. Bibasilar scarring changes and atelectasis. Electronically Signed   By: Marijo Sanes M.D.   On: 05/30/2017 17:20   Dg Abdomen Acute W/chest  Result Date: 05/30/2017 CLINICAL DATA:  Abdominal swelling. History of bowel torsion in the past. No vomiting. EXAM: DG ABDOMEN ACUTE W/ 1V CHEST COMPARISON:  Chest x-ray of November 15, 2016 FINDINGS: The lungs are hypoinflated. The hemidiaphragms are elevated due to of moderately distended gas-filled loops of small and large bowel under the hemidiaphragms. There is left basilar density compatible with atelectasis. The heart is not enlarged. The pulmonary vascularity is not engorged. Within the abdomen there are loops of moderately distended gas-filled bowel present with the largest caliber of a bowel loop as much as 13 cm. The most distended bowel appears to be colon but some small bowel distention is present. There is stool present within portions of the colon. There is a small amount of rectal gas. No free extraluminal gas  collections are observed. There is degenerative change of the lumbar spine with scoliosis convex toward the right. IMPRESSION: Distension of small and large bowel loops within the abdomen. One cannot exclude sigmoid volvulus in the appropriate setting. Surgical consultation and abdominal and pelvic CT scanning would be useful. Electronically Signed   By: David  Martinique M.D.   On: 05/30/2017 15:14    Anti-infectives: Anti-infectives    None       Assessment/Plan Alzheimers/Parkinson's dementia/memory loss - followed by [facility] Neurology Pituitary microadenoma Hx of PE on coumadin HTN Scoliosis  Sigmoid volvulus  - s/p decompression 9/10 Dr. Cristina Gong - discussed surgical intervention with family and they wish to proceed with nonop management. Will consult palliative medicine for official goals of care. - add daily miralax - diet per GI, on clear liquids. - consult PT  FEN - IVF, clear liquids VTE - SCDs, heparin ID - none    LOS: 0 days    Brigid Re , [facility] Surgery 05/31/2017, 7:53 AM Pager: [phone] Consults: [phone] Mon-Fri 7:00 am-4:30 pm Sat-Sun 7:00 am-11:30 am

## 2017-05-31 NOTE — Progress Notes (Addendum)
ANTICOAGULATION CONSULT NOTE - Initial Consult  Pharmacy Consult for heparin Indication: h/o pulmonary embolus  Allergies  Allergen Reactions  . Aricept [Donepezil Hcl] Other (See Comments)    Symptomatic Bradycardia.  . Other Other (See Comments)    Shrimp gives him gout  . Shellfish-Derived Products Other (See Comments)    Causes a flare up with Gout    Vital Signs: Temp: 98.4 F (36.9 C) (09/11 0202) Temp Source: Axillary (09/11 0202) BP: 126/78 (09/11 0202) Pulse Rate: 59 (09/11 0107)  Labs:  Recent Labs  05/30/17 1403  HGB 13.5  HCT 42.5  PLT 160  LABPROT 18.4*  INR 1.54  CREATININE 1.03     Medical History: Past Medical History:  Diagnosis Date  . Anxiety   . Coronary artery disease, non-occlusive    with history of MI; Cath 2008 w multivessel nonobstructive CAD  . Dementia   . Depression   . GERD (gastroesophageal reflux disease)   . Hyperlipidemia   . Hypertension   . Memory loss   . Parkinson disease ([place])   . PE (pulmonary embolism)    unprovoked PE completed 6 months of warfarin, warfarin d/ced 02/12/2010, repeat PE 07/10/2010 after a long car ride and now on lifelong coumadin.  . Pituitary microadenoma ([place])    incidental finding CT 12/2009  . Prostate cancer ([place])   . Scoliosis   . Sigmoid volvulus ([place]) 08/02/2016    Medications:  Prescriptions Prior to Admission  Medication Sig Dispense Refill Last Dose  . acetaminophen (TYLENOL) 500 MG tablet Take 1,000 mg by mouth every 4 (four) hours as needed for mild pain or headache.    Taking  . amLODipine (NORVASC) 2.5 MG tablet Take 1 tablet (2.5 mg total) by mouth daily. 90 tablet 3 Taking  . aspirin 81 MG chewable tablet Chew 81 mg by mouth daily as needed (chest pain).    Taking  . b complex vitamins tablet Take 1 tablet by mouth every morning.    Taking  . Ca Phosphate-Cholecalciferol (CALCIUM/VITAMIN D3 GUMMIES) 250-350 MG-UNIT CHEW Chew 1 Dose by mouth 2 (two) times daily. 180 tablet 3  Taking  . carbidopa-levodopa (SINEMET IR) 25-100 MG tablet Take 1 tablet by mouth 4 (four) times daily. 120 tablet 2   . fish oil-omega-3 fatty acids 1000 MG capsule Take 2 g by mouth every morning.    Taking  . memantine (NAMENDA XR) 28 MG CP24 24 hr capsule Take 1 capsule (28 mg total) by mouth every morning. 90 capsule 3 Taking  . Multiple Vitamins-Minerals (MULTIVITAMIN WITH MINERALS) tablet Take 1 tablet by mouth every morning.    Taking  . polyethylene glycol (MIRALAX / GLYCOLAX) packet Take 17 g by mouth daily as needed. (Patient taking differently: Take 17 g by mouth daily as needed for mild constipation. ) 14 each 1 Taking  . potassium chloride SA (KLOR-CON M20) 20 MEQ tablet Take 2 tablets (40 mEq total) by mouth daily. 90 tablet 1   . pravastatin (PRAVACHOL) 10 MG tablet Take 1 tablet (10 mg total) by mouth daily. 90 tablet 3 Taking  . QUEtiapine (SEROQUEL) 25 MG tablet Take 1 tablet (25 mg total) by mouth at bedtime. May use half a tablet 12.5 mg during the daytime as needed for agitation/hallucinations 90 tablet 4 Taking  . senna (SENOKOT) 8.6 MG TABS tablet Take 1 tablet (8.6 mg total) by mouth daily as needed for mild constipation. 120 each 0 Taking  . vitamin C (ASCORBIC ACID) 500 MG tablet  Take 1,000 mg by mouth every morning.    Taking  . warfarin (COUMADIN) 5 MG tablet Take 0.5-1 tablets (2.5-5 mg total) by mouth See admin instructions. Take 1 tablet on Mon, and Fri. Take 1/2 tablet all other days 30 tablet 3    Scheduled:  . amLODipine  2.5 mg Oral Daily  . carbidopa-levodopa  1 tablet Oral QID  . enoxaparin (LOVENOX) injection  40 mg Subcutaneous Q24H  . memantine  28 mg Oral [id number]  . pravastatin  10 mg Oral Daily    Assessment: 83yo male admitted w/ recurrent sigmoid volvulus, to transition from Coumadin to heparin for h/o PE; current INR below goal.  Goal of Therapy:  Heparin level 0.3-0.7 units/ml   Plan:  Will start heparin gtt at 1000 units/hr and monitor  heparin levels and CBC.  Wynona Neat, PharmD, BCPS  05/31/2017,2:19 AM

## 2017-05-31 NOTE — Progress Notes (Signed)
Received patient from ED accompanied by RN.  Patient sleeping, VS stable with brady PR at 58 and O2Sat at 97% on RA.  Patient placed on telemetry monitor per order, and skin intact.  CNA replaced pad due to incontinence.  Bed alrm on.  Will monitor.

## 2017-06-01 ENCOUNTER — Encounter (HOSPITAL_COMMUNITY): Payer: Self-pay | Admitting: General Practice

## 2017-06-01 ENCOUNTER — Inpatient Hospital Stay (HOSPITAL_COMMUNITY): Payer: Medicare HMO

## 2017-06-01 DIAGNOSIS — K562 Volvulus: Secondary | ICD-10-CM

## 2017-06-01 HISTORY — DX: Volvulus: K56.2

## 2017-06-01 LAB — CBC
HEMATOCRIT: 37.1 % — AB (ref 39.0–52.0)
Hemoglobin: 12.2 g/dL — ABNORMAL LOW (ref 13.0–17.0)
MCH: 31.6 pg (ref 26.0–34.0)
MCHC: 32.9 g/dL (ref 30.0–36.0)
MCV: 96.1 fL (ref 78.0–100.0)
Platelets: 129 10*3/uL — ABNORMAL LOW (ref 150–400)
RBC: 3.86 MIL/uL — AB (ref 4.22–5.81)
RDW: 15.6 % — AB (ref 11.5–15.5)
WBC: 4.4 10*3/uL (ref 4.0–10.5)

## 2017-06-01 LAB — BASIC METABOLIC PANEL
ANION GAP: 6 (ref 5–15)
BUN: 9 mg/dL (ref 6–20)
CO2: 23 mmol/L (ref 22–32)
Calcium: 8.3 mg/dL — ABNORMAL LOW (ref 8.9–10.3)
Chloride: 112 mmol/L — ABNORMAL HIGH (ref 101–111)
Creatinine, Ser: 0.93 mg/dL (ref 0.61–1.24)
GLUCOSE: 86 mg/dL (ref 65–99)
POTASSIUM: 3.7 mmol/L (ref 3.5–5.1)
Sodium: 141 mmol/L (ref 135–145)

## 2017-06-01 LAB — PROTIME-INR
INR: 1.62
Prothrombin Time: 19.1 seconds — ABNORMAL HIGH (ref 11.4–15.2)

## 2017-06-01 LAB — HEPARIN LEVEL (UNFRACTIONATED): HEPARIN UNFRACTIONATED: 0.79 [IU]/mL — AB (ref 0.30–0.70)

## 2017-06-01 MED ORDER — WARFARIN - PHARMACIST DOSING INPATIENT
Freq: Every day | Status: DC
  Administered 2017-06-01: 18:00:00

## 2017-06-01 MED ORDER — WARFARIN SODIUM 5 MG PO TABS
5.0000 mg | ORAL_TABLET | Freq: Once | ORAL | Status: AC
  Administered 2017-06-01: 5 mg via ORAL
  Filled 2017-06-01: qty 1

## 2017-06-01 MED ORDER — POTASSIUM CHLORIDE CRYS ER 20 MEQ PO TBCR
40.0000 meq | EXTENDED_RELEASE_TABLET | Freq: Once | ORAL | Status: AC
  Administered 2017-06-01: 40 meq via ORAL
  Filled 2017-06-01: qty 2

## 2017-06-01 MED ORDER — SODIUM CHLORIDE 0.9 % IV SOLN
INTRAVENOUS | Status: DC
  Administered 2017-06-01: 19:00:00 via INTRAVENOUS

## 2017-06-01 MED ORDER — INFLUENZA VAC SPLIT HIGH-DOSE 0.5 ML IM SUSY: 0.5000 mL | PREFILLED_SYRINGE | INTRAMUSCULAR | Status: DC | PRN

## 2017-06-01 MED ORDER — HEPARIN (PORCINE) IN NACL 100-0.45 UNIT/ML-% IJ SOLN
700.0000 [IU]/h | INTRAMUSCULAR | Status: AC
  Administered 2017-06-01: 700 [IU]/h via INTRAVENOUS
  Filled 2017-06-01: qty 250

## 2017-06-01 NOTE — Evaluation (Signed)
Physical Therapy Evaluation Patient Details Name: Eschol Auxier MRN: [id number] DOB: July 16, 1931 Today's Date: 06/01/2017   History of Present Illness  Mr. Schachter is an 83 y.o m with hx of htn, parkinson's disease, prior sigmoid volvulus which was corrected by flexible sigmoidoscopy x2, prostate cancer, dementia, previous pulmonary embolus on coumadin, and CAD who presented for abdominal distension and decreased appetite.  Underwent sigmoidoscopy showing recurrent sigmoid volvulus.  Clinical Impression  Patient presents with decreased mobility and balance and limited ambulation and will benefit from skilled PT in the acute setting to allow return home with family and caregiver support and follow up HHPT.  Spoke with pt's daughter who is prepared to continue caring for him at home and to resume HHPT at d/c.     Follow Up Recommendations Supervision/Assistance - 24 hour;Home health PT (currently with [facility])    Equipment Recommendations  Other (comment) (TBA)    Recommendations for Other Services       Precautions / Restrictions Precautions Precautions: Fall      Mobility  Bed Mobility Overal bed mobility: Needs Assistance Bed Mobility: Supine to Sit       Sit to supine: Max assist   General bed mobility comments: assist for both legs off bed and to lift trunk upright  Transfers Overall transfer level: Needs assistance Equipment used: Rolling walker (2 wheeled) Transfers: Sit to/from Stand Sit to Stand: Mod assist;+2 physical assistance;From elevated surface         General transfer comment: assist for anterior weight shift, to lift hips and balance with walker further forward due to knees flexed; pivotal steps with walker to chair +2 A for walker management, balance, getting chair close  Ambulation/Gait                Stairs            Wheelchair Mobility    Modified Rankin (Stroke Patients Only)       Balance Overall balance assessment: Needs  assistance Sitting-balance support: Feet supported Sitting balance-Leahy Scale: Poor Sitting balance - Comments: initially mint o mod A due to leaning back, assist to come forward and cues and able to sit with S for several minute prior to leaning back again   Standing balance support: Bilateral upper extremity supported Standing balance-Leahy Scale: Poor Standing balance comment: A of 2 plus walker for support                             Pertinent Vitals/Pain Pain Assessment: Faces Faces Pain Scale: Hurts little more Pain Location: unknown (?generalized) Pain Descriptors / Indicators: Grimacing Pain Intervention(s): Monitored during session;Repositioned    Home Living Family/patient expects to be discharged to:: Unsure Living Arrangements: Children Available Help at Discharge: Family;Available 24 hours/day;Personal care attendant Type of Home: House Home Access: Stairs to enter Entrance Stairs-Rails: Psychiatric nurse of Steps: 4 Home Layout: Able to live on main level with bedroom/bathroom;Two level Home Equipment: Walker - 2 wheels;Bedside commode;Wheelchair - manual;Shower seat      Prior Function Level of Independence: Needs assistance   Gait / Transfers Assistance Needed: Assist with ambulation with RW, Uses w/c at times. Needs assist standing from chairs. Sleeps in recliner  ADL's / Homemaking Assistance Needed: Assist with ADLs from PCA.  Comments: Daughter works but has aides come in so pt is never alone.     Hand Dominance   Dominant Hand: Right    Extremity/Trunk Assessment   Upper  Extremity Assessment Upper Extremity Assessment: Difficult to assess due to impaired cognition    Lower Extremity Assessment Lower Extremity Assessment: Difficult to assess due to impaired cognition    Cervical / Trunk Assessment Cervical / Trunk Assessment: Kyphotic;Other exceptions Cervical / Trunk Exceptions: increased tone in trunk and  extremities, repositioning with max A  Communication   Communication: Expressive difficulties  Cognition Arousal/Alertness: Lethargic Behavior During Therapy: WFL for tasks assessed/performed Overall Cognitive Status: No family/caregiver present to determine baseline cognitive functioning                                 General Comments: states first name, but not DOB, able to communicate some that he was uncomfortable, but did not state location of pain      General Comments      Exercises     Assessment/Plan    PT Assessment Patient needs continued PT services  PT Problem List Decreased balance;Decreased strength;Impaired tone;Decreased cognition;Decreased knowledge of use of DME       PT Treatment Interventions DME instruction;Therapeutic activities;Gait training;Therapeutic exercise;Patient/family education;Balance training;Functional mobility training    PT Goals (Current goals can be found in the Care Plan section)  Acute Rehab PT Goals Patient Stated Goal: unable PT Goal Formulation: Patient unable to participate in goal setting Time For Goal Achievement: 06/15/17 Potential to Achieve Goals: Fair    Frequency Min 3X/week   Barriers to discharge        Co-evaluation               AM-PAC PT "6 Clicks" Daily Activity  Outcome Measure Difficulty turning over in bed (including adjusting bedclothes, sheets and blankets)?: Unable Difficulty moving from lying on back to sitting on the side of the bed? : Unable Difficulty sitting down on and standing up from a chair with arms (e.g., wheelchair, bedside commode, etc,.)?: Unable Help needed moving to and from a bed to chair (including a wheelchair)?: A Lot Help needed walking in hospital room?: Total Help needed climbing 3-5 steps with a railing? : Total 6 Click Score: 7    End of Session Equipment Utilized During Treatment: Gait belt Activity Tolerance: Patient limited by lethargy Patient left:  in chair;with call bell/phone within reach;with chair alarm set   PT Visit Diagnosis: Other abnormalities of gait and mobility (R26.89);Other symptoms and signs involving the nervous system (R29.898)    Time: 2440-1027 PT Time Calculation (min) (ACUTE ONLY): 18 min   Charges:   PT Evaluation $PT Eval Moderate Complexity: 1 Mod     PT G CodesMagda Kiel, [place] [phone] 06/01/2017   Reginia Naas 06/01/2017, 3:07 PM

## 2017-06-01 NOTE — Progress Notes (Signed)
ANTICOAGULATION CONSULT NOTE - FOLLOW UP    HL = 0.79 (goal 0.3 - 0.7 units/mL) Heparin dosing weight = 68 kg   Assessment: 86 YOM continues on IV heparin for history of PE while Coumadin is on hold.  Heparin level is trending down but remains supra-therapeutic.  Lab drawn appropriately and no bleeding per RN.   Plan: Reduce heparin gtt to 700 units/hr Check 8 hr heparin level   [name] [name], PharmD, BCPS 06/01/2017, 6:14 AM

## 2017-06-01 NOTE — Final Consult Note (Signed)
[facility] Progress Note  2 Days Post-Op  Subjective: CC: sigmoid volvulus Patient resting comfortably. Per RN has not been complaining of abdominal pain. No family present at this time.   Objective: Vital signs in last 24 hours: Temp:  [98.1 F (36.7 C)] 98.1 F (36.7 C) (09/12 0415) Pulse Rate:  [67-77] 77 (09/12 0415) Resp:  [18-19] 19 (09/12 0415) BP: (140-155)/(81-84) 155/83 (09/12 0415) SpO2:  [99 %] 99 % (09/12 0415) Last BM Date: 05/31/17  Intake/Output from previous day: 09/11 0701 - 09/12 0700 In: 1118.5 [P.O.:900; I.V.:218.5] Out: 751 [Urine:750; Stool:1] Intake/Output this shift: No intake/output data recorded.  PE: Gen:  Alert, NAD, pleasant Card:  Regular rate and rhythm, pedal pulses 2+ BL Pulm:  Normal effort, clear to auscultation bilaterally Abd: Soft, non-tender, non-distended, bowel sounds present, no HSM Skin: warm and dry, no rashes  Psych: A&Ox3   Lab Results:   Recent Labs  05/31/17 0312 06/01/17 0458  WBC 3.6* 4.4  HGB 11.6* 12.2*  HCT 37.6* 37.1*  PLT 148* 129*   BMET  Recent Labs  05/31/17 0312 06/01/17 0458  NA 143 141  K 3.4* 3.7  CL 112* 112*  CO2 24 23  GLUCOSE 75 86  BUN 10 9  CREATININE 0.94 0.93  CALCIUM 8.3* 8.3*   PT/INR  Recent Labs  05/31/17 0312 06/01/17 0458  LABPROT 18.5* 19.1*  INR 1.56 1.62   CMP     Component Value Date/Time   NA 141 06/01/2017 0458   NA 145 (H) 02/17/2017 1204   K 3.7 06/01/2017 0458   CL 112 (H) 06/01/2017 0458   CO2 23 06/01/2017 0458   GLUCOSE 86 06/01/2017 0458   BUN 9 06/01/2017 0458   BUN 14 02/17/2017 1204   CREATININE 0.93 06/01/2017 0458   CREATININE 0.96 12/23/2014 1214   CALCIUM 8.3 (L) 06/01/2017 0458   PROT 6.8 05/30/2017 1403   PROT 6.7 02/17/2017 1204   ALBUMIN 3.7 05/30/2017 1403   ALBUMIN 4.2 02/17/2017 1204   AST 30 05/30/2017 1403   ALT 6 (L) 05/30/2017 1403   ALKPHOS 37 (L) 05/30/2017 1403   BILITOT 0.7 05/30/2017 1403   BILITOT 0.3  02/17/2017 1204   GFRNONAA >60 06/01/2017 0458   GFRNONAA 74 12/23/2014 1214   GFRAA >60 06/01/2017 0458   GFRAA 85 12/23/2014 1214   Lipase     Component Value Date/Time   LIPASE 27 05/30/2017 1403       Studies/Results: Dg Abdomen 1 View  Result Date: 05/30/2017 CLINICAL DATA:  Status post flexible sigmoidoscopy and attempted decompression of sigmoid volvulus. EXAM: ABDOMEN - 1 VIEW COMPARISON:  Abdominal radiographs and CT scan 05/30/2017 FINDINGS: Significant interval decompression of sigmoid colon distention. The sigmoid measured up to 13 cm and now measures a maximum of 6 cm. No free air is demonstrated. Stool has moved down into the rectum. IMPRESSION: Decompression of sigmoid volvulus. No findings to suggest perforation. Electronically Signed   By: Marijo Sanes M.D.   On: 05/30/2017 21:35   Ct Abdomen Pelvis W Contrast  Result Date: 05/30/2017 CLINICAL DATA:  Abdominal distension.  Possible sigmoid volvulus. EXAM: CT ABDOMEN AND PELVIS WITH CONTRAST TECHNIQUE: Multidetector CT imaging of the abdomen and pelvis was performed using the standard protocol following bolus administration of intravenous contrast. CONTRAST:  100 cc ISOVUE-300 IOPAMIDOL (ISOVUE-300) INJECTION 61% COMPARISON:  08/01/2016 FINDINGS: Lower chest: Degraded by motion artifact. Subsegmental atelectasis bilaterally can scarring changes. Elevation of both hemidiaphragms. The heart is normal in  size. Stable coronary artery calcifications. Hepatobiliary: No focal hepatic lesions or intrahepatic biliary dilatation. The gallbladder is grossly normal. Colonic interposition is noted. Pancreas: Pancreatic atrophy but no mass or inflammation. Spleen: Normal size.  Stable small cysts. Adrenals/Urinary Tract: The adrenal glands and kidneys are unremarkable and stable. Multiple small renal cysts and chronic renal scarring changes. No hydronephrosis. The bladder is unremarkable. Prior TURP defect. Stomach/Bowel: Markedly distended  and tortuous sigmoid colon with swirled appearance of the mid sigmoid colon consistent with a sigmoid volvulus. This looks very similar to the prior CT scan 2017. Large amount of stool is noted in the ascending colon, transverse colon and descending colon. Vascular/Lymphatic: With marked tortuosity of the thoracic aorta. Moderate atherosclerotic calcifications distally and involving the iliac arteries. No mesenteric or retroperitoneal mass or adenopathy. Reproductive: The prostate gland and seminal vesicles are surgically absent. Other: No inguinal mass or adenopathy. No free pelvic fluid collections. Musculoskeletal: Scoliosis and severe degenerative lumbar spondylosis. IMPRESSION: 1. CT findings consistent with a sigmoid volvulus. Very similar findings to the prior CT scan from November 2017. 2. No other significant abdominal/pelvic findings. 3. Bibasilar scarring changes and atelectasis. Electronically Signed   By: Marijo Sanes M.D.   On: 05/30/2017 17:20   Dg Abdomen Acute W/chest  Result Date: 05/30/2017 CLINICAL DATA:  Abdominal swelling. History of bowel torsion in the past. No vomiting. EXAM: DG ABDOMEN ACUTE W/ 1V CHEST COMPARISON:  Chest x-ray of November 15, 2016 FINDINGS: The lungs are hypoinflated. The hemidiaphragms are elevated due to of moderately distended gas-filled loops of small and large bowel under the hemidiaphragms. There is left basilar density compatible with atelectasis. The heart is not enlarged. The pulmonary vascularity is not engorged. Within the abdomen there are loops of moderately distended gas-filled bowel present with the largest caliber of a bowel loop as much as 13 cm. The most distended bowel appears to be colon but some small bowel distention is present. There is stool present within portions of the colon. There is a small amount of rectal gas. No free extraluminal gas collections are observed. There is degenerative change of the lumbar spine with scoliosis convex toward  the right. IMPRESSION: Distension of small and large bowel loops within the abdomen. One cannot exclude sigmoid volvulus in the appropriate setting. Surgical consultation and abdominal and pelvic CT scanning would be useful. Electronically Signed   By: David  Martinique M.D.   On: 05/30/2017 15:14    Anti-infectives: Anti-infectives    None       Assessment/Plan Alzheimers/Parkinson's dementia/memory loss - followed by [facility] Pituitary microadenoma Hx of PE on coumadin HTN Scoliosis  Sigmoid volvulus  - s/p decompression 9/10 Dr. Cristina Gong - discussed surgical intervention with family and they wish to proceed with nonop management. Will consult palliative medicine for official goals of care. - add daily miralax - advance to fulls, can advance as tolerated to soft diet if tolerating fulls - consult PT  FEN - IVF, fulls - can advance diet as tolerated VTE - SCDs, heparin ID - none  Plan: No surgical intervention desired. We will sign off at this point. Please feel free to call with questions/concerns.   LOS: 1 day    Brigid Re , [facility] 06/01/2017, 8:33 AM Pager: [phone] Consults: [phone] Mon-Fri 7:00 am-4:30 pm Sat-Sun 7:00 am-11:30 am

## 2017-06-01 NOTE — Progress Notes (Signed)
   Subjective: No acute events overnight, no family was present in room during morning rounds. Pt was resting comfortably and did not express any abdominal pain but unable to gather more information based on pt mental status. Will contact daughter to update on his situation and for further discussion of likelihood of recurrence, care needs, etc.   Objective:  Vital signs in last 24 hours: Vitals:   05/31/17 0521 05/31/17 1350 05/31/17 2028 06/01/17 0415  BP: 122/88 140/84 (!) 144/81 (!) 155/83  Pulse: 66 67 77 77  Resp: 17 18 19 19   Temp: 98.2 F (36.8 C) 98.1 F (36.7 C) 98.1 F (36.7 C) 98.1 F (36.7 C)  TempSrc: Oral Oral Oral Oral  SpO2: 97% 99% 99% 99%  Weight:      Height:       Physical Exam  Constitutional:  Elderly gentleman laying in bed, no acute distress   HENT:  Head: Normocephalic and atraumatic.  Cardiovascular: Normal rate and regular rhythm.   Pulmonary/Chest: Effort normal. No respiratory distress.  Abdominal:  Soft, BS +, no tenderness/grimacing with palpation throughout abdomen    Musculoskeletal:  UE rigidity   Neurological:  Responsive with yes/no questions and interactive, remains c/w reported baseline      Assessment/Plan:  Sigmoid Volvulus Pt has a history of sigmoid volvulus in 07/2016, presented with abdominal distension, constipation and found to have recurrent volvulus on CT. GI was consulted and he underwent flexible sigmoidoscopy with successful decompression. General surgery was also consulted, family opted for non-operative management given his age, comorbidities. However, he does remain at risk for recurrence.  --Monitor vital signs, abdominal exam  --F/u GI, Surgery recommendations --Full liquid diet    History of PE  Pt on chronic warfarin for recurrent PE. INR at presentation 1.54 in setting of reported missed doses at home. Placed on heparin gtt in the setting of potential procedural interventions, can now resume warfarin.  --Resume  Warfarin per pharmacy   Parkinson's Disease, Dementia  Pt with a history of advanced dementia and Parkinson's disease, low functional status and minimally interactive at baseline.  --Continue home memantine, Sinemet, Seroquel     Dispo: Anticipated discharge in approximately 1-2 day(s).   Tawny Asal, MD 06/01/2017, 7:22 AM Pager: [phone]

## 2017-06-01 NOTE — Progress Notes (Signed)
[place] for warfarin Indication: h/o pulmonary embolus  Allergies  Allergen Reactions  . Aricept [Donepezil Hcl] Other (See Comments)    Symptomatic Bradycardia.  . Other Other (See Comments)    Shrimp gives him gout  . Shellfish-Derived Products Other (See Comments)    Causes a flare up with Gout    Vital Signs: Temp: 98.1 F (36.7 C) (09/12 0415) Temp Source: Oral (09/12 0415) BP: 155/83 (09/12 0415) Pulse Rate: 77 (09/12 0415)  Labs:  Recent Labs  05/30/17 1403 05/31/17 0312 05/31/17 1049 05/31/17 1921 06/01/17 0458  HGB 13.5 11.6*  --   --  12.2*  HCT 42.5 37.6*  --   --  37.1*  PLT 160 148*  --   --  129*  LABPROT 18.4* 18.5*  --   --  19.1*  INR 1.54 1.56  --   --  1.62  HEPARINUNFRC  --   --  0.58 0.88* 0.79*  CREATININE 1.03 0.94  --   --  0.93     Medical History: Past Medical History:  Diagnosis Date  . Anxiety   . Coronary artery disease, non-occlusive    with history of MI; Cath 2008 w multivessel nonobstructive CAD  . Dementia   . Depression   . GERD (gastroesophageal reflux disease)   . Hyperlipidemia   . Hypertension   . Memory loss   . Parkinson disease ([place])   . PE (pulmonary embolism)    unprovoked PE completed 6 months of warfarin, warfarin d/ced 02/12/2010, repeat PE 07/10/2010 after a long car ride and now on lifelong coumadin.  . Pituitary microadenoma ([place])    incidental finding CT 12/2009  . Prostate cancer ([place])   . Scoliosis   . Sigmoid volvulus ([place]) 08/02/2016    Medications:  Prescriptions Prior to Admission  Medication Sig Dispense Refill Last Dose  . acetaminophen (TYLENOL) 500 MG tablet Take 1,000 mg by mouth 2 (two) times daily.    05/30/2017 at unk  . amLODipine (NORVASC) 2.5 MG tablet Take 1 tablet (2.5 mg total) by mouth daily. 90 tablet 3 05/30/2017 at Unknown time  . aspirin 81 MG chewable tablet Chew 81 mg by mouth daily as needed (chest pain).    Past Month at Unknown time   . b complex vitamins tablet Take 1 tablet by mouth every morning.    05/30/2017 at Unknown time  . Ca Phosphate-Cholecalciferol (CALCIUM/VITAMIN D3 GUMMIES) 250-350 MG-UNIT CHEW Chew 1 Dose by mouth 2 (two) times daily. 180 tablet 3 05/30/2017 at Unknown time  . carbidopa-levodopa (SINEMET IR) 25-100 MG tablet Take 1 tablet by mouth 4 (four) times daily. 120 tablet 2 05/30/2017 at Unknown time  . fish oil-omega-3 fatty acids 1000 MG capsule Take 2 g by mouth every morning.    05/30/2017 at Unknown time  . memantine (NAMENDA XR) 28 MG CP24 24 hr capsule Take 1 capsule (28 mg total) by mouth every morning. 90 capsule 3 05/30/2017 at Unknown time  . Multiple Vitamins-Minerals (MULTIVITAMIN WITH MINERALS) tablet Take 1 tablet by mouth every morning.    05/30/2017 at Unknown time  . polyethylene glycol (MIRALAX / GLYCOLAX) packet Take 17 g by mouth daily as needed. (Patient taking differently: Take 17 g by mouth daily as needed for mild constipation. ) 14 each 1 Past Month at Unknown time  . potassium chloride SA (KLOR-CON M20) 20 MEQ tablet Take 2 tablets (40 mEq total) by mouth daily. 90 tablet 1 05/30/2017 at Unknown  time  . pravastatin (PRAVACHOL) 10 MG tablet Take 1 tablet (10 mg total) by mouth daily. 90 tablet 3 05/30/2017 at Unknown time  . QUEtiapine (SEROQUEL) 25 MG tablet Take 1 tablet (25 mg total) by mouth at bedtime. May use half a tablet 12.5 mg during the daytime as needed for agitation/hallucinations 90 tablet 4 05/30/2017 at Unknown time  . senna (SENOKOT) 8.6 MG TABS tablet Take 1 tablet (8.6 mg total) by mouth daily as needed for mild constipation. 120 each 0 Past Month at Unknown time  . vitamin C (ASCORBIC ACID) 500 MG tablet Take 1,000 mg by mouth every morning.    05/30/2017 at Unknown time  . warfarin (COUMADIN) 5 MG tablet Take 0.5-1 tablets (2.5-5 mg total) by mouth See admin instructions. Take 1 tablet on Mon, and Fri. Take 1/2 tablet all other days (Patient taking differently: Take 2.5-5  mg by mouth See admin instructions. 5mg  Mon, Wed - 2.5mg  all other days) 30 tablet 3 05/29/2017 at 1900   Scheduled:  . amLODipine  2.5 mg Oral Daily  . carbidopa-levodopa  1 tablet Oral QID  . memantine  28 mg Oral Daily  . polyethylene glycol  17 g Oral Daily  . pravastatin  10 mg Oral Daily  . QUEtiapine  25 mg Oral QHS    Assessment: 83 yo male admitted w/ recurrent sigmoid volvulus, not planning to have surgery, therefore warfarin will be resumed today. Current INR 1.6.  PTA warfarin: 5 mg Mon/Wed, 2.5 mg all other days  Goal of Therapy:  Heparin level 0.3-0.7 units/ml    Plan:  -Resume warfarin 5 mg po x1 -Daily INR -Discontinue heparin -Discussed bridge with MD, did not want to bridge in this patient   Harvel Quale  06/01/2017 11:06 AM

## 2017-06-01 NOTE — Progress Notes (Signed)
Internal Medicine Attending:   I saw and examined the patient. I reviewed the resident's note and I agree with the resident's findings and plan as documented in the resident's note. On AM rounds I repeated and agree with [name] [name] physical exam, he had positive BS no appreciated distension and no appreciated tenderness, he was awake and answered simple questions for me. I however returned at 1pm to meet with his daughter and [name] [name].  I know both [name] [name] and [name] [name] well as I am his PCP and she accompanies him to all visits. On my repeat examination he is sitting up in chair, abdomen is more full and appears distended, bowel sounds are more hypoactive, he did not appear to be in any pain from palpation.  He was overall mostly sleeping.  -Will go ahead an obtain repeat abdominal film to assess for recurrence of Volvulus,  We had discussed resuming warfarin however if this is present will need to cancel PM dose of warfarin. Will continue heparin drip.  Potasium is up to 3.7 will need additional replacement (given 58mEq this afternoon) goal is over 4.0 and would like closer to 4.5-5 given recurrent volvulus.  I discussed the current situation with [name] [name].  GI is concerned for likely recurrence (if it has not already happened) of volvulus without surgery.  As previously noted, he did have this back in November, we treated conservatively and was recurrence free with oral potassium supplementation for about 10 months.  She is hopeful this will happen again, she does not think he will do well with a major surgery but would be willing to proceed "if no other options."  After his last hospitalization [name] [name] and I had discussed a more palliative approach to his care and discontinuation some of his medications.  I readdressed this today.  I have also leaned this [name] [name]'s wife is now in hospice and they are enjoying hospice care.  However [name] [name] has always been the healthier one and [name] [name] does not feel that a  hospice approach is appropriate for him (she associates hospice with palliative care) I discussed the differences and she is agreeable for a formal palliative care consult.  I also again addressed some of his previous thoughts about his goals of care, [name] [name] notes that he would want potentially aggressive care but would not want to be burdensome for his family so would not want to pursue any futile care.  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  She typically makes decisions with her brother too who is a Environmental education officer, he will likely be around some but she is not sure.  Her cell phone number is in the chart.

## 2017-06-01 NOTE — Progress Notes (Signed)
Subjective: Denies abdominal pain. Unclear if passing flatus or stool.  Objective: Vital signs in last 24 hours: Temp:  [98.1 F (36.7 C)] 98.1 F (36.7 C) (09/12 0415) Pulse Rate:  [67-77] 77 (09/12 0415) Resp:  [18-19] 19 (09/12 0415) BP: (140-155)/(81-84) 155/83 (09/12 0415) SpO2:  [99 %] 99 % (09/12 0415) Weight change:  Last BM Date: 05/31/17  PE: GEN:  Awake, can answer some but not most questions, minimally interactive, NAD ABD:  Mild-to-mod distention with tympany; non tender; no peritonitis  Lab Results: CBC    Component Value Date/Time   WBC 4.4 06/01/2017 0458   RBC 3.86 (L) 06/01/2017 0458   HGB 12.2 (L) 06/01/2017 0458   HGB 13.1 12/09/2016 1114   HCT 37.1 (L) 06/01/2017 0458   HCT 40.8 12/09/2016 1114   PLT 129 (L) 06/01/2017 0458   PLT 186 12/09/2016 1114   MCV 96.1 06/01/2017 0458   MCV 91 12/09/2016 1114   MCH 31.6 06/01/2017 0458   MCHC 32.9 06/01/2017 0458   RDW 15.6 (H) 06/01/2017 0458   RDW 16.3 (H) 12/09/2016 1114   LYMPHSABS 1.2 05/30/2017 1403   MONOABS 0.3 05/30/2017 1403   EOSABS 0.1 05/30/2017 1403   BASOSABS 0.0 05/30/2017 1403   CMP     Component Value Date/Time   NA 141 06/01/2017 0458   NA 145 (H) 02/17/2017 1204   K 3.7 06/01/2017 0458   CL 112 (H) 06/01/2017 0458   CO2 23 06/01/2017 0458   GLUCOSE 86 06/01/2017 0458   BUN 9 06/01/2017 0458   BUN 14 02/17/2017 1204   CREATININE 0.93 06/01/2017 0458   CREATININE 0.96 12/23/2014 1214   CALCIUM 8.3 (L) 06/01/2017 0458   PROT 6.8 05/30/2017 1403   PROT 6.7 02/17/2017 1204   ALBUMIN 3.7 05/30/2017 1403   ALBUMIN 4.2 02/17/2017 1204   AST 30 05/30/2017 1403   ALT 6 (L) 05/30/2017 1403   ALKPHOS 37 (L) 05/30/2017 1403   BILITOT 0.7 05/30/2017 1403   BILITOT 0.3 02/17/2017 1204   GFRNONAA >60 06/01/2017 0458   GFRNONAA 74 12/23/2014 1214   GFRAA >60 06/01/2017 0458   GFRAA 85 12/23/2014 1214   Assessment:  1.  Sigmoid volvulus, successful endoscopic  decompression.  Plan:  1.  Advance to full liquids. 2.  Family per chart does not want to pursue surgical intervention, which is understandable, but means that patient will almost assuredly have repeated bouts of volvulus requiring readmissions and repeat endoscopic decompressions. 3.  PT has been consulted. 4.  Palliative care consult for goals of care seems reasonable. 5.  Will follow.   [name] 06/01/2017, 8:40 AM   Cell [phone] If no answer or after 5 PM call [phone]

## 2017-06-01 NOTE — Progress Notes (Signed)
Patient's abdomen distended and expressed pain which can be seen in facial expression.  Administered PRN pain medication tylenol suppository per order since patient is on NPO.  Patient's daughter very concern of patient's pain and discomfort and wanted to talk to on-call MD since according to her, a GI procedure will be done later today to decompress patient's abdomen of the sigmoid volvulus after the result of the DG abdomen portable xray.  This RN able to contact on-call IM resident and patient's daughter and IM resident talked over the phone.  Patient's daughter informed this RN that on-call IM resident will call back.  As of this writing, daughter is still at patient's bedside and patient resting comfortably.  Will continue to monitor.

## 2017-06-02 ENCOUNTER — Encounter (HOSPITAL_COMMUNITY)
Admission: EM | Disposition: A | Discharge status: Home Health | Payer: Self-pay | Source: Home / Self Care | Attending: Internal Medicine

## 2017-06-02 ENCOUNTER — Encounter (HOSPITAL_COMMUNITY): Payer: Self-pay

## 2017-06-02 DIAGNOSIS — K562 Volvulus: Principal | ICD-10-CM

## 2017-06-02 DIAGNOSIS — Z515 Encounter for palliative care: Secondary | ICD-10-CM

## 2017-06-02 DIAGNOSIS — Z7189 Other specified counseling: Secondary | ICD-10-CM

## 2017-06-02 HISTORY — PX: FLEXIBLE SIGMOIDOSCOPY: SHX5431

## 2017-06-02 LAB — BASIC METABOLIC PANEL
ANION GAP: 8 (ref 5–15)
BUN: 6 mg/dL (ref 6–20)
CALCIUM: 8.4 mg/dL — AB (ref 8.9–10.3)
CO2: 23 mmol/L (ref 22–32)
Chloride: 107 mmol/L (ref 101–111)
Creatinine, Ser: 0.84 mg/dL (ref 0.61–1.24)
GFR calc Af Amer: >60 mL/min (ref >60)
Glucose, Bld: 93 mg/dL (ref 65–99)
POTASSIUM: 3.4 mmol/L — AB (ref 3.5–5.1)
SODIUM: 138 mmol/L (ref 135–145)

## 2017-06-02 LAB — PROTIME-INR
INR: 1.52
Prothrombin Time: 18.2 seconds — ABNORMAL HIGH (ref 11.4–15.2)

## 2017-06-02 LAB — MAGNESIUM: Magnesium: 1.6 mg/dL — ABNORMAL LOW (ref 1.7–2.4)

## 2017-06-02 LAB — CBC
HCT: 37.7 % — ABNORMAL LOW (ref 39.0–52.0)
Hemoglobin: 12.1 g/dL — ABNORMAL LOW (ref 13.0–17.0)
MCH: 30.9 pg (ref 26.0–34.0)
MCHC: 32.1 g/dL (ref 30.0–36.0)
MCV: 96.4 fL (ref 78.0–100.0)
Platelets: 160 10*3/uL (ref 150–400)
RBC: 3.91 MIL/uL — AB (ref 4.22–5.81)
RDW: 15.4 % (ref 11.5–15.5)
WBC: 2.8 10*3/uL — AB (ref 4.0–10.5)

## 2017-06-02 SURGERY — SIGMOIDOSCOPY, FLEXIBLE
Anesthesia: Moderate Sedation | Laterality: Left

## 2017-06-02 MED ORDER — FENTANYL CITRATE (PF) 100 MCG/2ML IJ SOLN
INTRAMUSCULAR | Status: AC
  Filled 2017-06-02: qty 2

## 2017-06-02 MED ORDER — HEPARIN (PORCINE) IN NACL 100-0.45 UNIT/ML-% IJ SOLN
850.0000 [IU]/h | INTRAMUSCULAR | Status: DC
  Administered 2017-06-02: 700 [IU]/h via INTRAVENOUS
  Administered 2017-06-03 – 2017-06-05 (×2): 850 [IU]/h via INTRAVENOUS
  Filled 2017-06-02 (×3): qty 250

## 2017-06-02 MED ORDER — MAGNESIUM SULFATE 2 GM/50ML IV SOLN
2.0000 g | Freq: Once | INTRAVENOUS | Status: AC
  Administered 2017-06-02: 2 g via INTRAVENOUS
  Filled 2017-06-02: qty 50

## 2017-06-02 MED ORDER — POTASSIUM CHLORIDE 10 MEQ/50ML IV SOLN
10.0000 meq | INTRAVENOUS | Status: DC
  Filled 2017-06-02 (×4): qty 50

## 2017-06-02 MED ORDER — WARFARIN SODIUM 5 MG PO TABS: 5.0000 mg | ORAL_TABLET | Freq: Once | ORAL | Status: DC

## 2017-06-02 MED ORDER — POTASSIUM CHLORIDE IN NACL 40-0.9 MEQ/L-% IV SOLN
INTRAVENOUS | Status: DC
Start: 2017-06-02 — End: 2017-06-03
  Administered 2017-06-02 – 2017-06-03 (×2): 75 mL/h via INTRAVENOUS
  Filled 2017-06-02 (×2): qty 1000

## 2017-06-02 MED ORDER — POTASSIUM CHLORIDE CRYS ER 20 MEQ PO TBCR: 40.0000 meq | EXTENDED_RELEASE_TABLET | Freq: Once | ORAL | Status: DC

## 2017-06-02 MED ORDER — MIDAZOLAM HCL 5 MG/ML IJ SOLN
INTRAMUSCULAR | Status: AC
  Filled 2017-06-02: qty 2

## 2017-06-02 NOTE — Progress Notes (Signed)
Palliative:  Full note to follow. I have met today with daughter/HCPOA, [name], at bedside. [name] describes her father as continuing to be able to feed himself and ambulate some with assistance and views him as being able to have some meaningful interactions and fair QOL at this point. Furthermore, she says that her father's wishes have always been to "try" everything they can to keep him alive. With this she desires full code.   However, she fears surgery and how he will tolerate. She is also concerned about likely colostomy with surgery and would hope to avoid this if at all possible. She is hopeful to attempt 1 more flex sig decompression (he had this twice last year and did well since last November and is hopeful for same trajectory). IF this decompression is not successful she does desire to pursue surgical intervention. We discussed risks of surgery as well as concern of anesthesia with dementia and not knowing how he will tolerate or be able to recovery from surgery.   She cares for her father and mother at home and has much caregiver burden. Emotional support provided. Will need assistance from [facility] for home equipment and resources.   Vinie Sill, NP Palliative Medicine Team Pager # [phone] (M-F 8a-5p) Team Phone # [phone] (Nights/Weekends)

## 2017-06-02 NOTE — Progress Notes (Signed)
PT Cancellation Note  Patient Details Name: [name] [name] MRN: [id number] DOB: June 24, 1931   Cancelled Treatment:  Pt is currently scheduled for a repeat sigmoidoscopy and decompression today. We will hold PT today until procedure is complete and pt is stable for therapy.      Lelon Mast 06/02/2017, 10:46 AM

## 2017-06-02 NOTE — Progress Notes (Signed)
   Subjective: Yesterday afternoon pt developed worsened abdominal distension, KUB was obtained which was concerning for recurrent volvulus. Case discussed with radiology, GI at the time. His daughter was also updated by the night team, she reported he appeared to be in pain overnight. No documented bowel movements in last 24 hours.   Care discussions were also held with the daughter yesterday afternoon, see attending progress note for full details. In brief, she was open to palliative care discussions but does not feel he is at that point yet. Also noted that they prefer non-operative management but would consider surgery if there are no other options.   Objective:  Vital signs in last 24 hours: Vitals:   05/31/17 1350 05/31/17 2028 06/01/17 0415 06/01/17 1420  BP: 140/84 (!) 144/81 (!) 155/83 (!) 152/96  Pulse: 67 77 77 72  Resp: 18 19 19 18   Temp: 98.1 F (36.7 C) 98.1 F (36.7 C) 98.1 F (36.7 C) 98 F (36.7 C)  TempSrc: Oral Oral Oral Oral  SpO2: 99% 99% 99% 98%  Weight:      Height:       Physical Exam  Constitutional:  Elderly gentleman laying in bed, no acute distress   HENT:  Head: Normocephalic and atraumatic.  Cardiovascular: Normal rate and regular rhythm.   Pulmonary/Chest: Effort normal. No respiratory distress.  Abdominal:  Increased abdominal distension and decreased bowel sounds compared to prior   Musculoskeletal:  UE rigidity   Neurological:  Responsive with yes/no questions and interactive, remains c/w reported baseline      Assessment/Plan:  Sigmoid Volvulus Pt has a history of sigmoid volvulus in 07/2016, presented with abdominal distension, constipation and found to have recurrent volvulus on CT. GI was consulted and he underwent flexible sigmoidoscopy with successful decompression. General surgery was also consulted and family opted for non-operative management at the time. However, he has already developed recurrent volvulus. GI will perform repeat  decompression, however discussion regarding surgical management and goals will need to be revisited given this early recurrence. --Monitor vital signs, abdominal exam  --NPO --Repeat decompression with GI --Replete K  --Palliative care consult    Parkinson's Disease, Dementia  Pt with a history of advanced dementia and Parkinson's disease, low functional status and minimally interactive at baseline.  --Continue home memantine, Sinemet, Seroquel     Dispo: Anticipated discharge in approximately 1-2 day(s).   Tawny Asal, MD 06/02/2017, 11:03 AM Pager: [phone]

## 2017-06-02 NOTE — Progress Notes (Signed)
Patient ID: [name] [name], male   DOB: December 14, 1930, 83 y.o.   MRN: [id number]  [facility] Surgery Progress Note  3 Days Post-Op  Subjective: CC- volvulus Patient resting comfortably, daughter at bedside. Unfortunately patient began having increased abdominal pain/distension yesterday. XR showed recurrence of volvulus. He is going for sigmoidoscopy with decompression later today.  Objective: Vital signs in last 24 hours: Temp:  [98 F (36.7 C)] 98 F (36.7 C) (09/12 1420) Pulse Rate:  [72] 72 (09/12 1420) Resp:  [18] 18 (09/12 1420) BP: (152)/(96) 152/96 (09/12 1420) SpO2:  [98 %] 98 % (09/12 1420) Last BM Date: 06/01/17  Intake/Output from previous day: 09/12 0701 - 09/13 0700 In: 637.3 [I.V.:637.3] Out: 375 [Urine:375] Intake/Output this shift: No intake/output data recorded.  PE: Gen:  Resting, NAD Card:  Regular rate and rhythm, pedal pulses 2+ BL Pulm:  Normal effort, clear to auscultation bilaterally Abd: Soft, distended, NT, few BS, no HSM, no hernia Skin: warm and dry, no rashes  Psych: A&Ox3  Lab Results:   Recent Labs  06/01/17 0458 06/02/17 0457  WBC 4.4 2.8*  HGB 12.2* 12.1*  HCT 37.1* 37.7*  PLT 129* 160   BMET  Recent Labs  06/01/17 0458 06/02/17 0457  NA 141 138  K 3.7 3.4*  CL 112* 107  CO2 23 23  GLUCOSE 86 93  BUN 9 6  CREATININE 0.93 0.84  CALCIUM 8.3* 8.4*   PT/INR  Recent Labs  06/01/17 0458 06/02/17 0457  LABPROT 19.1* 18.2*  INR 1.62 1.52   CMP     Component Value Date/Time   NA 138 06/02/2017 0457   NA 145 (H) 02/17/2017 1204   K 3.4 (L) 06/02/2017 0457   CL 107 06/02/2017 0457   CO2 23 06/02/2017 0457   GLUCOSE 93 06/02/2017 0457   BUN 6 06/02/2017 0457   BUN 14 02/17/2017 1204   CREATININE 0.84 06/02/2017 0457   CREATININE 0.96 12/23/2014 1214   CALCIUM 8.4 (L) 06/02/2017 0457   PROT 6.8 05/30/2017 1403   PROT 6.7 02/17/2017 1204   ALBUMIN 3.7 05/30/2017 1403   ALBUMIN 4.2 02/17/2017 1204   AST 30  05/30/2017 1403   ALT 6 (L) 05/30/2017 1403   ALKPHOS 37 (L) 05/30/2017 1403   BILITOT 0.7 05/30/2017 1403   BILITOT 0.3 02/17/2017 1204   GFRNONAA >60 06/02/2017 0457   GFRNONAA 74 12/23/2014 1214   GFRAA >60 06/02/2017 0457   GFRAA 85 12/23/2014 1214   Lipase     Component Value Date/Time   LIPASE 27 05/30/2017 1403       Studies/Results: Dg Abd Portable 1v  Result Date: 06/01/2017 CLINICAL DATA:  Sigmoid volvulus, abdominal distention EXAM: PORTABLE ABDOMEN - 1 VIEW COMPARISON:  05/30/2017 abdomen radiographs from 1453 hours prior to sigmoid volvulus decompression and post decompression radiographs at 2112 hours on 05/30/2017. FINDINGS: Abnormal bowel gas pattern with marked gaseous distention of large bowel with similar appearance prior to decompression of sigmoid volvulus on 05/30/2017. The largest distended loop of bowel measures up to 10.8 cm in diameter in the the left lower quadrant. Numerous surgical clips are again seen in the lower pelvis. There is dextroconvex scoliosis of the lumbar spine with spondylosis. No free air is noted.  No organomegaly or suspicious calculi. IMPRESSION: Abnormal bowel gas pattern suspicious for recurrence of sigmoid volvulus. These results were discussed at the time of interpretation on 06/01/2017 at 7:00 pm with Dr. Johny Chess. Electronically Signed   By: Meredith Leeds.D.  On: 06/01/2017 19:06    Anti-infectives: Anti-infectives    None       Assessment/Plan Alzheimers/Parkinson's dementia/memory loss - followed by [facility] Neurology Pituitary microadenoma Hx of PE on coumadin HTN Scoliosis  Sigmoid volvulus  - s/p decompression 9/10 Dr. Cristina Gong - volvulus recurred, undergoing repeat sigmoidoscopy with decompression today - appreciate palliative consult  FEN - IVF, NPO for procedure VTE - SCDs. D/c coumadin and transition back to heparin after procedure ID - none  Plan: Patient undergoing repeat sigmoidoscopy with decompression  today. Had discussion with daughter regarding operative versus nonoperative management. Following this decompression she would like to continue nonoperative management UNLESS this recurs again. Would recommend holding coumadin following this procedure in case this does recur and he needs operative intervention. Will continue to follow.   LOS: 2 days    Wellington Hampshire , [facility] Surgery 06/02/2017, 11:27 AM Pager: [phone] Consults: [phone] Mon-Fri 7:00 am-4:30 pm Sat-Sun 7:00 am-11:30 am
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
## 2017-06-02 NOTE — Progress Notes (Signed)
I have had lengthy discussion with patient's daughter.  We have reviewed the details of risks and benefits of sigmoidoscopy with decompression.  I have emphasized this isn't curative procedure, but a temporizing measure, with high risk of recurrence.  We reviewed also risks of perforation with procedure, which could be fatal event.  After review of all this in detail, we have elected to repeat sigmoidoscopy for decompression today, and family wants to discuss with surgical team again surgical options as well.

## 2017-06-02 NOTE — Progress Notes (Addendum)
[place] for warfarin Indication: h/o pulmonary embolus  Allergies  Allergen Reactions  . Aricept [Donepezil Hcl] Other (See Comments)    Symptomatic Bradycardia.  . Other Other (See Comments)    Shrimp gives him gout  . Shellfish-Derived Products Other (See Comments)    Causes a flare up with Gout    Vital Signs:    Labs:  Recent Labs  05/31/17 0312 05/31/17 1049 05/31/17 1921 06/01/17 0458 06/02/17 0457  HGB 11.6*  --   --  12.2* 12.1*  HCT 37.6*  --   --  37.1* 37.7*  PLT 148*  --   --  129* 160  LABPROT 18.5*  --   --  19.1* 18.2*  INR 1.56  --   --  1.62 1.52  HEPARINUNFRC  --  0.58 0.88* 0.79*  --   CREATININE 0.94  --   --  0.93 0.84     Medical History: Past Medical History:  Diagnosis Date  . Anxiety   . Coronary artery disease, non-occlusive    with history of MI; Cath 2008 w multivessel nonobstructive CAD  . Dementia   . Depression   . GERD (gastroesophageal reflux disease)   . Hyperlipidemia   . Hypertension   . Memory loss   . Parkinson disease ([place])   . PE (pulmonary embolism)    unprovoked PE completed 6 months of warfarin, warfarin d/ced 02/12/2010, repeat PE 07/10/2010 after a long car ride and now on lifelong coumadin.  . Pituitary microadenoma ([place])    incidental finding CT 12/2009  . Prostate cancer ([place])   . Scoliosis   . Sigmoid volvulus ([place]) 08/02/2016  . Volvulus of colon ([place]) 06/01/2017    Medications:  Prescriptions Prior to Admission  Medication Sig Dispense Refill Last Dose  . acetaminophen (TYLENOL) 500 MG tablet Take 1,000 mg by mouth 2 (two) times daily.    05/30/2017 at unk  . amLODipine (NORVASC) 2.5 MG tablet Take 1 tablet (2.5 mg total) by mouth daily. 90 tablet 3 05/30/2017 at Unknown time  . aspirin 81 MG chewable tablet Chew 81 mg by mouth daily as needed (chest pain).    Past Month at Unknown time  . b complex vitamins tablet Take 1 tablet by mouth every morning.    05/30/2017  at Unknown time  . Ca Phosphate-Cholecalciferol (CALCIUM/VITAMIN D3 GUMMIES) 250-350 MG-UNIT CHEW Chew 1 Dose by mouth 2 (two) times daily. 180 tablet 3 05/30/2017 at Unknown time  . carbidopa-levodopa (SINEMET IR) 25-100 MG tablet Take 1 tablet by mouth 4 (four) times daily. 120 tablet 2 05/30/2017 at Unknown time  . fish oil-omega-3 fatty acids 1000 MG capsule Take 2 g by mouth every morning.    05/30/2017 at Unknown time  . memantine (NAMENDA XR) 28 MG CP24 24 hr capsule Take 1 capsule (28 mg total) by mouth every morning. 90 capsule 3 05/30/2017 at Unknown time  . Multiple Vitamins-Minerals (MULTIVITAMIN WITH MINERALS) tablet Take 1 tablet by mouth every morning.    05/30/2017 at Unknown time  . polyethylene glycol (MIRALAX / GLYCOLAX) packet Take 17 g by mouth daily as needed. (Patient taking differently: Take 17 g by mouth daily as needed for mild constipation. ) 14 each 1 Past Month at Unknown time  . potassium chloride SA (KLOR-CON M20) 20 MEQ tablet Take 2 tablets (40 mEq total) by mouth daily. 90 tablet 1 05/30/2017 at Unknown time  . pravastatin (PRAVACHOL) 10 MG tablet Take 1 tablet (10  mg total) by mouth daily. 90 tablet 3 05/30/2017 at Unknown time  . QUEtiapine (SEROQUEL) 25 MG tablet Take 1 tablet (25 mg total) by mouth at bedtime. May use half a tablet 12.5 mg during the daytime as needed for agitation/hallucinations 90 tablet 4 05/30/2017 at Unknown time  . senna (SENOKOT) 8.6 MG TABS tablet Take 1 tablet (8.6 mg total) by mouth daily as needed for mild constipation. 120 each 0 Past Month at Unknown time  . vitamin C (ASCORBIC ACID) 500 MG tablet Take 1,000 mg by mouth every morning.    05/30/2017 at Unknown time  . warfarin (COUMADIN) 5 MG tablet Take 0.5-1 tablets (2.5-5 mg total) by mouth See admin instructions. Take 1 tablet on Mon, and Fri. Take 1/2 tablet all other days (Patient taking differently: Take 2.5-5 mg by mouth See admin instructions. 5mg  Mon, Wed - 2.5mg  all other days) 30  tablet 3 05/29/2017 at 1900   Scheduled:  . amLODipine  2.5 mg Oral Daily  . carbidopa-levodopa  1 tablet Oral QID  . memantine  28 mg Oral Daily  . polyethylene glycol  17 g Oral Daily  . pravastatin  10 mg Oral Daily  . QUEtiapine  25 mg Oral QHS  . Warfarin - Pharmacist Dosing Inpatient   Does not apply q1800    Assessment: 83 yo male admitted w/ recurrent sigmoid volvulus, not planning to have surgery, therefore warfarin was resumed. Current INR 1.5.  PTA warfarin: 5 mg Mon/Wed, 2.5 mg all other days  Goal of Therapy:  Heparin level 0.3-0.7 units/ml    Plan:  -Resume warfarin 5 mg po x1 -Daily INR -Discussed bridging with MD yesterday, did not want to bridge in this patient   Harvel Quale  06/02/2017 8:44 AM   Addendum -Pt now considering surgical intervention -Planning to do sigmoidoscopy with decompression today and then may require further surgery after taht -Hold warfarin -Resume heparin 700 units/hr after procedure this afternoon -Daily HL, CBC   Harvel Quale  06/02/2017 12:46 PM

## 2017-06-02 NOTE — Progress Notes (Signed)
Pt calling daughter d/t abd pain and grimacing. Pt's RLQ abd distended/bulging.  Gave PRN pain medication tylenol supp at 0257.  Daughter requested to talk to on-call IM resident again expressing her concern of the implication of the volvulus recurrence and just wanted her father to be comfortable.  Paged on-call IM resident and both talked over the phone.  IM resident will call back.  Will monitor.

## 2017-06-02 NOTE — Interval H&P Note (Signed)
History and Physical Interval Note:  06/02/2017 2:18 PM  [name] [name]  has presented today for surgery, with the diagnosis of sigmoid volvulus  The various methods of treatment have been discussed with the patient and family. After consideration of risks, benefits and other options for treatment, the patient has consented to  Procedure(s): FLEXIBLE SIGMOIDOSCOPY (Left) as a surgical intervention .  The patient's history has been reviewed, patient examined, no change in status, stable for surgery.  I have reviewed the patient's chart and labs.  Questions were answered to the patient's satisfaction.     [name]

## 2017-06-02 NOTE — Progress Notes (Signed)
Received a page that the patient's daughter [name] [name] was concerned that the patient was experiencing abdominal pain. Night team went to the room to speak with her and evaluate the patient. On arrival, the patient is sleeping comfortably. Pt daughter expressed that through the evening he would intermittently squeeze her hand and crying out to her in pain.   On exam the patient is resting comfortably, he is not in acute distress and slept through the exam. He is not pale or diaphoretic. His abdomen is distended, bowel sounds are delayed and he's tympanic to percussion. He has no grimace or guarding on abdominal exam.   This afternoon an abdominal xray was consistent with reoccurrence of the volvulus. The patient is being managed with tylenol suppositories because he is NPO at this time. Daughter is concerned that his pain is not being controlled with the tylenol.  I am concerned that giving a stronger medication such as opioids would lead to worsening of his obstruction, toradol could cause kidney injury or GI bleeding with increased risk of these given his age, and the anticholinergic effects of antispasmodics have anticholinergic effect which could also worsen his constipation and worsen his confusion.  We explained that trying to use these stronger medications to control the intermittent pain that he was having could lead to serious adverse reactions. She was hesitant but agreeable to continuing the tylenol for pain.   [name] [name] expressed that she was hopeful a GI doctor could come by the room and speak with her at this time. She explained that during his last admission he had two flexible sigmoidoscopies and following the second intervention he had no reoccurrence for a year. We reassured the patient's daughter that we would notify the patient's team about this concern.

## 2017-06-02 NOTE — Progress Notes (Signed)
ANTICOAGULATION CONSULT NOTE - Initial Consult  Pharmacy Consult for Heparin Indication: History of Pulmonary Embolism  Allergies  Allergen Reactions  . Aricept [Donepezil Hcl] Other (See Comments)    Symptomatic Bradycardia.  . Other Other (See Comments)    Shrimp gives him gout  . Shellfish-Derived Products Other (See Comments)    Causes a flare up with Gout    Patient Measurements: Height: 5\' 6"  (167.6 cm) Weight: 149 lb (67.6 kg) IBW/kg (Calculated) : 63.8 Heparin Dosing Weight: 67.6 kg  Vital Signs: Temp: 97.8 F (36.6 C) (09/13 1529) Temp Source: Axillary (09/13 1529) BP: 165/95 (09/13 1529) Pulse Rate: 54 (09/13 1529)  Labs:  Recent Labs  05/31/17 0312 05/31/17 1049 05/31/17 1921 06/01/17 0458 06/02/17 0457  HGB 11.6*  --   --  12.2* 12.1*  HCT 37.6*  --   --  37.1* 37.7*  PLT 148*  --   --  129* 160  LABPROT 18.5*  --   --  19.1* 18.2*  INR 1.56  --   --  1.62 1.52  HEPARINUNFRC  --  0.58 0.88* 0.79*  --   CREATININE 0.94  --   --  0.93 0.84    Estimated Creatinine Clearance: 59.1 mL/min (by C-G formula based on SCr of 0.84 mg/dL).   Medical History: Past Medical History:  Diagnosis Date  . Anxiety   . Coronary artery disease, non-occlusive    with history of MI; Cath 2008 w multivessel nonobstructive CAD  . Dementia   . Depression   . GERD (gastroesophageal reflux disease)   . Hyperlipidemia   . Hypertension   . Memory loss   . Parkinson disease ([place])   . PE (pulmonary embolism)    unprovoked PE completed 6 months of warfarin, warfarin d/ced 02/12/2010, repeat PE 07/10/2010 after a long car ride and now on lifelong coumadin.  . Pituitary microadenoma ([place])    incidental finding CT 12/2009  . Prostate cancer ([place])   . Scoliosis   . Sigmoid volvulus ([place]) 08/02/2016  . Volvulus of colon ([place]) 06/01/2017    Assessment: 83 year old male on chronic warfarin prior to admission now s/p sigmoidoscopy with decompression today to transition to IV  heparin in case may require further surgery.  Confirmed with Dr. Paulita Fujita that it is ok to start heparin.  H/H is stable. Platelets are 160. No blood loss with procedure.  Prior heparin rate was reduced to 700 units/hr due to elevated levels on higher doses. Will restart and check levels.   Goal of Therapy:  Heparin level 0.3-0.7 units/ml Monitor platelets by anticoagulation protocol: Yes   Plan:  Restart Heparin at 700 units/hr.  Recheck Heparin level in 8 hours.  Daily heparin level and CBC while on therapy.  Follow-up surgical plan and ability to resume warfarin.  Sloan Leiter, PharmD, BCPS Clinical Pharmacist Clinical phone 06/02/2017 until 11PM [phone] After hours, please call [phone] 06/02/2017,3:48 PM

## 2017-06-02 NOTE — H&P (View-Only) (Signed)
I have had lengthy discussion with patient's daughter.  We have reviewed the details of risks and benefits of sigmoidoscopy with decompression.  I have emphasized this isn't curative procedure, but a temporizing measure, with high risk of recurrence.  We reviewed also risks of perforation with procedure, which could be fatal event.  After review of all this in detail, we have elected to repeat sigmoidoscopy for decompression today, and family wants to discuss with surgical team again surgical options as well.

## 2017-06-02 NOTE — Consult Note (Signed)
Consultation Note Date: 06/02/2017   Patient Name: [name] [name]  DOB: 1931-05-25  MRN: [id number]  Age / Sex: 83 y.o., male  PCP: [name], DO Referring Physician: [name], DO  Reason for Consultation: Establishing goals of care  HPI/Patient Profile: 83 y.o. male  with past medical history of dementia, Parkinson's disease, CAD, HTN, HLD, PE, prostate cancer, pituitary microadenoma admitted on 05/30/2017 with recurrent sigmoid volvulus (last volvulus was Nov 2017 and required decompression via flex sig x 2).   Clinical Assessment and Goals of Care: I have met today with daughter/HCPOA, [name], at bedside. [name] describes her father as continuing to be able to feed himself and ambulate some with assistance and views him as being able to have some meaningful interactions and fair QOL at this point. Furthermore, she says that her father's wishes have always been to "try" everything they can to keep him alive. With this she desires full code.   However, she fears surgery and how he will tolerate. She is also concerned about likely colostomy with surgery and would hope to avoid this if at all possible. She is hopeful to attempt 1 more flex sig decompression (he had this twice last year and did well since last November and is hopeful for same trajectory). IF this decompression is not successful she does desire to pursue surgical intervention. We discussed risks of surgery as well as concern of anesthesia with dementia and not knowing how he will tolerate or be able to recovery from surgery.   She cares for her father and mother at home and has much caregiver burden. Emotional support provided. Will need assistance from [facility] for home equipment and resources.   Primary Decision Maker HCPOA Lilli Light    SUMMARY OF RECOMMENDATIONS   - Repeat decompression via flex sigmoid today - Surgery if flex sig  unsuccessful at controlling volvulus - HCPOA requesting full code   Code Status/Advance Care Planning:  Full code   Symptom Management:   Abd pain: Decompression planned for today  Palliative Prophylaxis:   Bowel Regimen, Delirium Protocol, Frequent Pain Assessment, Oral Care and Turn Reposition  Additional Recommendations (Limitations, Scope, Preferences):  Full Scope Treatment  Psycho-social/Spiritual:   Desire for further Chaplaincy support:no  Additional Recommendations: Caregiving  Support/Resources  Prognosis:   Unable to determine  Discharge Planning: Home with Palliative Services      Primary Diagnoses: Present on Admission: . Volvulus of colon ([place]) . Aortic atherosclerosis ([place]) . Parkinsonian syndrome ([place])   I have reviewed the medical record, interviewed the patient and family, and examined the patient. The following aspects are pertinent.  Past Medical History:  Diagnosis Date  . Anxiety   . Coronary artery disease, non-occlusive    with history of MI; Cath 2008 w multivessel nonobstructive CAD  . Dementia   . Depression   . GERD (gastroesophageal reflux disease)   . Hyperlipidemia   . Hypertension   . Memory loss   . Parkinson disease ([place])   . PE (pulmonary embolism)  unprovoked PE completed 6 months of warfarin, warfarin d/ced 02/12/2010, repeat PE 07/10/2010 after a long car ride and now on lifelong coumadin.  . Pituitary microadenoma ([place])    incidental finding CT 12/2009  . Prostate cancer ([place])   . Scoliosis   . Sigmoid volvulus ([place]) 08/02/2016  . Volvulus of colon ([place]) 06/01/2017   Social History   Social History  . Marital status: Married    Spouse name: N/A  . Number of children: 4  . Years of education: master's   Occupational History  . Retired Retired   Social History Main Topics  . Smoking status: Never Smoker  . Smokeless tobacco: Never Used  . Alcohol use No  . Drug use: No  . Sexual activity: No   Other  Topics Concern  . None   Social History Narrative   Lives with daughter. Wife also has severe dementia.    Drinks 1 cup of coffee a day    Family History  Problem Relation Age of Onset  . Hypertension Father        Passed away from cerebral hemorrhage at age of 63.  [name] Dementia Mother        Passed away  . Hypertension Child        4 adult children  . Cervical cancer Other   . Lung cancer Other   . Stroke Other   . Heart attack Neg Hx    Scheduled Meds: . [MAR Hold] amLODipine  2.5 mg Oral Daily  . [MAR Hold] carbidopa-levodopa  1 tablet Oral QID  . [MAR Hold] memantine  28 mg Oral Daily  . [MAR Hold] polyethylene glycol  17 g Oral Daily  . [MAR Hold] pravastatin  10 mg Oral Daily  . [MAR Hold] QUEtiapine  25 mg Oral QHS   Continuous Infusions: . 0.9 % NaCl with KCl 40 mEq / L 75 mL/hr (06/02/17 1020)   PRN Meds:.[MAR Hold] acetaminophen **OR** [MAR Hold] acetaminophen, [MAR Hold] Influenza vac split quadrivalent PF Allergies  Allergen Reactions  . Aricept [Donepezil Hcl] Other (See Comments)    Symptomatic Bradycardia.  . Other Other (See Comments)    Shrimp gives him gout  . Shellfish-Derived Products Other (See Comments)    Causes a flare up with Gout   Review of Systems  Unable to perform ROS: Dementia    Physical Exam  Constitutional: He appears well-developed. He appears lethargic.  HENT:  Head: Normocephalic and atraumatic.  Cardiovascular: Normal rate and regular rhythm.   Pulmonary/Chest: Effort normal. No accessory muscle usage. No tachypnea. No respiratory distress.  Abdominal: He exhibits distension. There is tenderness.  Neurological: He appears lethargic. He is disoriented.  Nursing note and vitals reviewed.   Vital Signs: BP (!) 152/96 (BP Location: Left Arm)   Pulse 72   Temp 98 F (36.7 C) (Oral)   Resp 18   Ht '5\' 6"'$  (1.676 m)   Wt 67.6 kg (149 lb)   SpO2 98%   BMI 24.05 kg/m  Pain Assessment: Faces   Pain Score: Asleep   SpO2:  SpO2: 98 % O2 Device:SpO2: 98 % O2 Flow Rate: .O2 Flow Rate (L/min): 2 L/min  IO: Intake/output summary:  Intake/Output Summary (Last 24 hours) at 06/02/17 1305 Last data filed at 06/02/17 0258  Gross per 24 hour  Intake            637.3 ml  Output              375 ml  Net            262.3 ml    LBM: Last BM Date: 06/01/17 Baseline Weight: Weight: 67.6 kg (149 lb) Most recent weight: Weight: 67.6 kg (149 lb)     Palliative Assessment/Data: 40%    Time Total: 63mn  Greater than 50%  of this time was spent counseling and coordinating care related to the above assessment and plan.  Signed by: AVinie Sill NP Palliative Medicine Team Pager # [phone](M-F 8a-5p) Team Phone # [phone](Nights/Weekends)

## 2017-06-02 NOTE — Op Note (Signed)
[facility] [place] Patient Name: [name] [name] Procedure Date : 06/02/2017 MRN: [id number] Attending MD: [name] [name] , MD Date of Birth: 01-Aug-1931 CSN: [id number] Age: 83 Admit Type: Inpatient Procedure:                Flexible Sigmoidoscopy Indications:              Abnormal abdominal x-ray of the GI tract Providers:                [name] Silence, MD, [name] Mayo, RN, [name] [name],                            Technician Referring MD:              Medicines:                None Complications:            No immediate complications. Estimated Blood Loss:     Estimated blood loss: none. Procedure:                Pre-Anesthesia Assessment:                           - Prior to the procedure, a History and Physical                            was performed, and patient medications and                            allergies were reviewed. The patient's tolerance of                            previous anesthesia was also reviewed. The risks                            and benefits of the procedure and the sedation                            options and risks were discussed with the patient.                            All questions were answered, and informed consent                            was obtained. Prior Anticoagulants: The patient has                            taken no previous anticoagulant or antiplatelet                            agents. ASA Grade Assessment: IV - A patient with                            severe systemic disease that is a constant threat  to life. After reviewing the risks and benefits,                            the patient was deemed in satisfactory condition to                            undergo the procedure.                           After obtaining informed consent, the scope was                            passed under direct vision. The [id number] ([id number])                            scope was introduced through the anus  and advanced                            to the the descending colon. The flexible                            sigmoidoscopy was accomplished without difficulty.                            The patient tolerated the procedure well. The                            quality of the bowel preparation was inadequate. Scope In: Scope Out: Findings:      The perianal and digital rectal examinations were normal.      A suspected volvulus, with viable appearing mucosa, was found in the       sigmoid colon. There was focal 2-3 cm segment of sigmoid narrowing with       upstream colonic dilatation. Decompression of the volvulus was       attempted, and partial decompression was achieved. Abdominal distention       improved after procedure. Mucosal evaluation not possible in this       unprepped procedure. Estimated blood loss: none. Impression:               - Preparation of the colon was inadequate.                           - Suspected volvulus. Partial decompression                            achieved.                           - No specimens collected. Moderate Sedation:      None Recommendation:           - Return patient to hospital ward for ongoing care.                           - Clear liquid diet.                           -  As I've explained to family, patient's volvulus                            is highly likely to recur, but hopefully this                            procedure will help things. Family understandly                            would like to avoid surgery if at all possible.                           [name] will follow. Procedure Code(s):        --- Professional ---                           [id number], Sigmoidoscopy, flexible; with decompression                            (for pathologic distention) (eg, volvulus,                            megacolon), including placement of decompression                            tube, when performed Diagnosis Code(s):        ---  Professional ---                           R93.3, Abnormal findings on diagnostic imaging of                            other parts of digestive tract CPT copyright 2016 [employer]. All rights reserved. The codes documented in this report are preliminary and upon coder review may  be revised to meet current compliance requirements. [name] Silence, MD 06/02/2017 2:16:09 PM This report has been signed electronically. Number of Addenda: 0

## 2017-06-02 NOTE — Progress Notes (Signed)
Drs. Hetty Ely and Chundi came to see and examine patient and talked to pt's daughter.

## 2017-06-03 ENCOUNTER — Inpatient Hospital Stay (HOSPITAL_COMMUNITY): Payer: Medicare HMO

## 2017-06-03 ENCOUNTER — Encounter (HOSPITAL_COMMUNITY): Payer: Self-pay | Admitting: Gastroenterology

## 2017-06-03 DIAGNOSIS — Z515 Encounter for palliative care: Secondary | ICD-10-CM

## 2017-06-03 LAB — BASIC METABOLIC PANEL
Anion gap: 7 (ref 5–15)
BUN: 8 mg/dL (ref 6–20)
CALCIUM: 7.9 mg/dL — AB (ref 8.9–10.3)
CO2: 20 mmol/L — ABNORMAL LOW (ref 22–32)
CREATININE: 0.91 mg/dL (ref 0.61–1.24)
Chloride: 113 mmol/L — ABNORMAL HIGH (ref 101–111)
GFR calc Af Amer: >60 mL/min (ref >60)
GLUCOSE: 63 mg/dL — AB (ref 65–99)
Potassium: 3.2 mmol/L — ABNORMAL LOW (ref 3.5–5.1)
SODIUM: 140 mmol/L (ref 135–145)

## 2017-06-03 LAB — CBC
HCT: 33.3 % — ABNORMAL LOW (ref 39.0–52.0)
HEMOGLOBIN: 10.8 g/dL — AB (ref 13.0–17.0)
MCH: 31.1 pg (ref 26.0–34.0)
MCHC: 32.4 g/dL (ref 30.0–36.0)
MCV: 96 fL (ref 78.0–100.0)
PLATELETS: 148 10*3/uL — AB (ref 150–400)
RBC: 3.47 MIL/uL — ABNORMAL LOW (ref 4.22–5.81)
RDW: 15.3 % (ref 11.5–15.5)
WBC: 3 10*3/uL — ABNORMAL LOW (ref 4.0–10.5)

## 2017-06-03 LAB — HEPARIN LEVEL (UNFRACTIONATED)
HEPARIN UNFRACTIONATED: 0.42 [IU]/mL (ref 0.30–0.70)
Heparin Unfractionated: 0.22 IU/mL — ABNORMAL LOW (ref 0.30–0.70)
Heparin Unfractionated: 0.51 IU/mL (ref 0.30–0.70)

## 2017-06-03 LAB — MAGNESIUM: MAGNESIUM: 1.9 mg/dL (ref 1.7–2.4)

## 2017-06-03 MED ORDER — KCL IN DEXTROSE-NACL 40-5-0.45 MEQ/L-%-% IV SOLN
INTRAVENOUS | Status: DC
  Administered 2017-06-03 (×2): via INTRAVENOUS
  Administered 2017-06-04 – 2017-06-05 (×2): 1 mL via INTRAVENOUS
  Administered 2017-06-05: 04:00:00 via INTRAVENOUS
  Filled 2017-06-03 (×7): qty 1000

## 2017-06-03 MED ORDER — POTASSIUM CHLORIDE CRYS ER 20 MEQ PO TBCR
40.0000 meq | EXTENDED_RELEASE_TABLET | ORAL | Status: AC
  Administered 2017-06-03 (×2): 40 meq via ORAL
  Filled 2017-06-03 (×2): qty 2

## 2017-06-03 NOTE — Progress Notes (Signed)
Physical Therapy Treatment Patient Details Name: [name] [name] MRN: [id number] DOB: April 07, 1931 Today's Date: 06/03/2017    History of Present Illness Mr. Brazzel is an 83 y.o m with hx of htn, parkinson's disease, prior sigmoid volvulus which was corrected by flexible sigmoidoscopy x2, prostate cancer, dementia, previous pulmonary embolus on coumadin, and CAD who presented for abdominal distension and decreased appetite.  Underwent sigmoidoscopy showing recurrent sigmoid volvulus.    PT Comments    Patient remains unable to mobilize without +2 assist.  Feel may need hoyer lift for safe transfers at d/c.  PT to follow acutely.   Follow Up Recommendations  Supervision/Assistance - 24 hour;Home health PT     Equipment Recommendations  Other (comment) (hoyer lift)    Recommendations for Other Services       Precautions / Restrictions Precautions Precautions: Fall    Mobility  Bed Mobility Overal bed mobility: Needs Assistance Bed Mobility: Rolling;Sidelying to Sit;Sit to Supine Rolling: Mod assist;Max assist Sidelying to sit: Max assist   Sit to supine: Total assist   General bed mobility comments: increased time and assist to get pt engaged to roll with multimodal cues to reach for railing  Transfers Overall transfer level: Needs assistance Equipment used: Rolling walker (2 wheeled) Transfers: Sit to/from Stand Sit to Stand: Max assist;From elevated surface         General transfer comment: three attempts for pt to stand with +1 assist, remains flexed at hips and knee with BOS anterior to COG.  Patient unable to maintain standing without significant assist   Ambulation/Gait                 Stairs            Wheelchair Mobility    Modified Rankin (Stroke Patients Only)       Balance Overall balance assessment: Needs assistance Sitting-balance support: Feet supported Sitting balance-Leahy Scale: Poor Sitting balance - Comments: overall  posterior bias; initially min to mod support, then able to progress to S level, but remains posterior     Standing balance-Leahy Scale: Zero Standing balance comment: max A for standing this session                            Cognition Arousal/Alertness: Awake/alert Behavior During Therapy: Flat affect Overall Cognitive Status: No family/caregiver present to determine baseline cognitive functioning                                        Exercises      General Comments        Pertinent Vitals/Pain Pain Score: 0-No pain    Home Living                      Prior Function            PT Goals (current goals can now be found in the care plan section) Progress towards PT goals: Not progressing toward goals - comment    Frequency    Min 3X/week      PT Plan Current plan remains appropriate    Co-evaluation              AM-PAC PT "6 Clicks" Daily Activity  Outcome Measure  Difficulty turning over in bed (including adjusting bedclothes, sheets and blankets)?: Unable Difficulty moving from lying on back  to sitting on the side of the bed? : Unable Difficulty sitting down on and standing up from a chair with arms (e.g., wheelchair, bedside commode, etc,.)?: Unable Help needed moving to and from a bed to chair (including a wheelchair)?: Total Help needed walking in hospital room?: Total Help needed climbing 3-5 steps with a railing? : Total 6 Click Score: 6    End of Session Equipment Utilized During Treatment: Gait belt Activity Tolerance: Patient limited by fatigue Patient left: in bed;with call bell/phone within reach;with bed alarm set   PT Visit Diagnosis: Other abnormalities of gait and mobility (R26.89);Other symptoms and signs involving the nervous system (R29.898)     Time: 8638-1771 PT Time Calculation (min) (ACUTE ONLY): 25 min  Charges:  $Therapeutic Activity: 23-37 mins                    G CodesMagda Kiel, [place] [phone] 06/03/2017    Reginia Naas 06/03/2017, 1:30 PM

## 2017-06-03 NOTE — Care Management Important Message (Signed)
Important Message  Patient Details  Name: [name] [name] MRN: [id number] Date of Birth: 04/02/1931   Medicare Important Message Given:  Yes    Evaline Waltman [address]ine [address] 06/03/2017, 12:45 PM

## 2017-06-03 NOTE — Progress Notes (Signed)
[place] for heparin Indication: history of PE  Allergies  Allergen Reactions  . Aricept [Donepezil Hcl] Other (See Comments)    Symptomatic Bradycardia.  . Other Other (See Comments)    Shrimp gives him gout  . Shellfish-Derived Products Other (See Comments)    Causes a flare up with Gout    Patient Measurements: Height: 5\' 6"  (167.6 cm) Weight: 149 lb (67.6 kg) IBW/kg (Calculated) : 63.8   Vital Signs: Temp: 98.4 F (36.9 C) (09/14 1300) Temp Source: Oral (09/14 1300) BP: 162/90 (09/14 1300) Pulse Rate: 66 (09/14 1300)  Labs:  Recent Labs  06/01/17 0458 06/02/17 0457 06/03/17 0004 06/03/17 0737 06/03/17 1416  HGB 12.2* 12.1* 10.8*  --   --   HCT 37.1* 37.7* 33.3*  --   --   PLT 129* 160 148*  --   --   LABPROT 19.1* 18.2*  --   --   --   INR 1.62 1.52  --   --   --   HEPARINUNFRC 0.79*  --  0.22* 0.42 0.51  CREATININE 0.93 0.84  --  0.91  --     Estimated Creatinine Clearance: 54.5 mL/min (by C-G formula based on SCr of 0.91 mg/dL).   Medications:  Scheduled:  . amLODipine  2.5 mg Oral Daily  . carbidopa-levodopa  1 tablet Oral QID  . memantine  28 mg Oral Daily  . polyethylene glycol  17 g Oral Daily  . potassium chloride  40 mEq Oral Q4H  . pravastatin  10 mg Oral Daily  . QUEtiapine  25 mg Oral QHS    Assessment: 83yo with history of PE on heparin bridge while Coumadin on hold s/p sigmoidoscopy and decompression due to voluvulus.   Afternoon heparin level is therapeutic.  Goal of Therapy:  Heparin level 0.3-0.7 units/ml Monitor platelets by anticoagulation protocol: Yes   Plan:  Continue heparin 850 units/hr Daily Heparin Level, CBC     Hughes Better, PharmD, BCPS Clinical Pharmacist 06/03/2017 4:18 PM

## 2017-06-03 NOTE — Progress Notes (Signed)
Internal Medicine Attending:   I saw and examined the patient. I reviewed the resident's note and I agree with the resident's findings and plan as documented in the resident's note. Daughter [name] at bedside, overall Tahji is more awake today, no complaints, abdomen is less distended. Potassium is still low, appears not given PM dose of potassium as he was asleep and was not given later.  Need to be more aggressive about replacement. Glucose on AM labs mildly low, likely due to poor intake, will change IVF to D5 1/2normal saline +40KCL.  Repeated abd xray, personally reviewed and shows expected improvement of sigmoid distension however still with some air filled left colon. Heparin drip has been restarted, this is fine as he will likely be here through the weekend.  Continue to hold coumadin.

## 2017-06-03 NOTE — Progress Notes (Signed)
Patient ID: [name] [name], male   DOB: [birth year], 83 y.o.   MRN: [id number]  [facility] Surgery Progress Note  1 Day Post-Op  Subjective: CC- sigmoid volvulus No complaints this morning. S/p successful sigmoidoscopy decompression yesterday. Tolerating clears. Denies abdominal pain. Passing flatus.  Objective: Vital signs in last 24 hours: Temp:  [97.8 F (36.6 C)-98.6 F (37 C)] 98.3 F (36.8 C) (09/14 0508) Pulse Rate:  [54-72] 61 (09/14 0508) Resp:  [15-19] 18 (09/14 0508) BP: (128-193)/(68-114) 128/68 (09/14 0508) SpO2:  [96 %-100 %] 96 % (09/14 0508) Last BM Date: 06/01/17  Intake/Output from previous day: 09/13 0701 - 09/14 0700 In: 1250 [I.V.:1250] Out: 351 [Urine:350; Stool:1] Intake/Output this shift: No intake/output data recorded.  PE: Gen: Resting, NAD Card: Regular rate and rhythm, pedal pulses 2+ BL Pulm: Normal effort, CTAB Abd: Soft, ND, NT, +BS, no HSM, no hernia Skin: warm and dry, no rashes  Psych: A&Ox3  Lab Results:   Recent Labs  06/02/17 0457 06/03/17 0004  WBC 2.8* 3.0*  HGB 12.1* 10.8*  HCT 37.7* 33.3*  PLT 160 148*   BMET  Recent Labs  06/01/17 0458 06/02/17 0457  NA 141 138  K 3.7 3.4*  CL 112* 107  CO2 23 23  GLUCOSE 86 93  BUN 9 6  CREATININE 0.93 0.84  CALCIUM 8.3* 8.4*   PT/INR  Recent Labs  06/01/17 0458 06/02/17 0457  LABPROT 19.1* 18.2*  INR 1.62 1.52   CMP     Component Value Date/Time   NA 138 06/02/2017 0457   NA 145 (H) 02/17/2017 1204   K 3.4 (L) 06/02/2017 0457   CL 107 06/02/2017 0457   CO2 23 06/02/2017 0457   GLUCOSE 93 06/02/2017 0457   BUN 6 06/02/2017 0457   BUN 14 02/17/2017 1204   CREATININE 0.84 06/02/2017 0457   CREATININE 0.96 12/23/2014 1214   CALCIUM 8.4 (L) 06/02/2017 0457   PROT 6.8 05/30/2017 1403   PROT 6.7 02/17/2017 1204   ALBUMIN 3.7 05/30/2017 1403   ALBUMIN 4.2 02/17/2017 1204   AST 30 05/30/2017 1403   ALT 6 (L) 05/30/2017 1403   ALKPHOS 37 (L) 05/30/2017  1403   BILITOT 0.7 05/30/2017 1403   BILITOT 0.3 02/17/2017 1204   GFRNONAA >60 06/02/2017 0457   GFRNONAA 74 12/23/2014 1214   GFRAA >60 06/02/2017 0457   GFRAA 85 12/23/2014 1214   Lipase     Component Value Date/Time   LIPASE 27 05/30/2017 1403       Studies/Results: Dg Abd Portable 1v  Result Date: 06/01/2017 CLINICAL DATA:  Sigmoid volvulus, abdominal distention EXAM: PORTABLE ABDOMEN - 1 VIEW COMPARISON:  05/30/2017 abdomen radiographs from 1453 hours prior to sigmoid volvulus decompression and post decompression radiographs at 2112 hours on 05/30/2017. FINDINGS: Abnormal bowel gas pattern with marked gaseous distention of large bowel with similar appearance prior to decompression of sigmoid volvulus on 05/30/2017. The largest distended loop of bowel measures up to 10.8 cm in diameter in the the left lower quadrant. Numerous surgical clips are again seen in the lower pelvis. There is dextroconvex scoliosis of the lumbar spine with spondylosis. No free air is noted.  No organomegaly or suspicious calculi. IMPRESSION: Abnormal bowel gas pattern suspicious for recurrence of sigmoid volvulus. These results were discussed at the time of interpretation on 06/01/2017 at 7:00 pm with Dr. Johny Chess. Electronically Signed   By: Ashley Royalty M.D.   On: 06/01/2017 19:06    Anti-infectives: Anti-infectives    None  Assessment/Plan Alzheimers/Parkinson's dementia/memory loss - followed by [facility] Neurology Pituitary microadenoma Hx of PE - holding coumadin HTN Scoliosis  Sigmoid volvulus  - s/p decompression 9/10 and 9/14 - appreciate palliative consult  FEN - IVF, clear liquids VTE - SCDs, heparin ID - none  Plan: S/p repeat sigmoidoscopy with decompression yesterday. Patient tolerating clear liquids. Advance diet per GI. Encourage OOB and continue PT. General surgery will be available if patient/family wishes to pursue surgical intervention.   LOS: 3 days    Wellington Hampshire , [facility] Surgery 06/03/2017, 7:28 AM Pager: [phone] Consults: [phone] Mon-Fri 7:00 am-4:30 pm Sat-Sun 7:00 am-11:30 am

## 2017-06-03 NOTE — Progress Notes (Addendum)
   Subjective: Pt underwent flexibly sigmoidoscopy yesterday with partial decompression of recurrent sigmoid volvulus. Surgical options were again discussed with the pt's daughter who has noted that if the volvulus recurs again then they would pursue surgical intervention for more definitive management.   This morning, pt is not complaining of abdominal pain, distension improved. Pt had a BM recorded yesterday.   Objective:  Vital signs in last 24 hours: Vitals:   06/02/17 1415 06/02/17 1529 06/02/17 2015 06/03/17 0508  BP: (!) 147/100 (!) 165/95 (!) 154/95 128/68  Pulse: 67 (!) 54 61 61  Resp: 19 18 18 18   Temp:  97.8 F (36.6 C) 98.1 F (36.7 C) 98.3 F (36.8 C)  TempSrc:  Axillary Axillary Axillary  SpO2: 100% 99% 97% 96%  Weight:      Height:       Physical Exam  Constitutional:  Elderly gentleman laying in bed, no acute distress   HENT:  Head: Normocephalic and atraumatic.  Cardiovascular: Normal rate and regular rhythm.   Pulmonary/Chest: Effort normal. No respiratory distress.  Abdominal:  Interval improvement in abdominal distension and bowel sounds, no tenderness to palpation appreciated   Musculoskeletal:  UE rigidity   Neurological:  Appropriate responses with simple questions and interactive, remains c/w reported baseline      Assessment/Plan:  Sigmoid Volvulus Pt has a history of sigmoid volvulus in 07/2016, presented with abdominal distension, constipation and found to have recurrent volvulus on CT. GI was consulted and he underwent flexible sigmoidoscopy with successful decompression. General surgery was also consulted and family opted for non-operative management at the time. However, he quickly developed recurrent volvulus. GI performed a repeat flex sig which resulted in partial decompression. The family will opt for surgical management if the volvulus recurs again in the short term.  --Monitor vital signs, abdominal exam  --Clear liquid diet, Fluids to   D5,1/2 NS with K --Repeat KUB for another post-decompression baseline  --BMP, Replete K  --Palliative care consult   History of PE  Pt on chronic warfarin for remote, recurrent PE. On presentation, INR subtherapeutic in setting of reported missed doses at home. --Heparin gtt in case further interventions required   Parkinson's Disease, Dementia  Pt with a history of advanced dementia and Parkinson's disease, low functional status and minimally interactive at baseline.  --Continue home memantine, Sinemet, Seroquel     Dispo: Anticipated discharge in approximately 2-3 day(s).   Tawny Asal, MD 06/03/2017, 7:10 AM Pager: [phone]

## 2017-06-03 NOTE — Progress Notes (Signed)
ANTICOAGULATION CONSULT NOTE - Follow Up Consult  Pharmacy Consult for heparin Indication: history of PE  Allergies  Allergen Reactions  . Aricept [Donepezil Hcl] Other (See Comments)    Symptomatic Bradycardia.  . Other Other (See Comments)    Shrimp gives him gout  . Shellfish-Derived Products Other (See Comments)    Causes a flare up with Gout    Patient Measurements: Height: 5\' 6"  (167.6 cm) Weight: 149 lb (67.6 kg) IBW/kg (Calculated) : 63.8   Vital Signs: Temp: 98.3 F (36.8 C) (09/14 0508) Temp Source: Axillary (09/14 0508) BP: 128/68 (09/14 0508) Pulse Rate: 61 (09/14 0508)  Labs:  Recent Labs  06/01/17 0458 06/02/17 0457 06/03/17 0004 06/03/17 0737  HGB 12.2* 12.1* 10.8*  --   HCT 37.1* 37.7* 33.3*  --   PLT 129* 160 148*  --   LABPROT 19.1* 18.2*  --   --   INR 1.62 1.52  --   --   HEPARINUNFRC 0.79*  --  0.22* 0.42  CREATININE 0.93 0.84  --  0.91    Estimated Creatinine Clearance: 54.5 mL/min (by C-G formula based on SCr of 0.91 mg/dL).   Medications:  Scheduled:  . amLODipine  2.5 mg Oral Daily  . carbidopa-levodopa  1 tablet Oral QID  . memantine  28 mg Oral Daily  . polyethylene glycol  17 g Oral Daily  . potassium chloride  40 mEq Oral Q4H  . pravastatin  10 mg Oral Daily  . QUEtiapine  25 mg Oral QHS    Assessment: 83yo with history of PE on heparin bridge while Coumadin on hold s/p sigmoidoscopy and decompression due to voluvulus.  Currenlty on CL diet with advancement per GI.  Heparin level is therapeutic with Hg & pltc somewhat decreased.  No bleeding noted.  Goal of Therapy:  Heparin level 0.3-0.7 units/ml Monitor platelets by anticoagulation protocol: Yes   Plan:  Continue heparin 850 units/hr Repeat Heparin Level 6hr to verify therapeutic x 2 Daily Heparin Level, CBC F/U plans to resume Coumadin   Gracy Bruins, B.S., PharmD [facility]

## 2017-06-03 NOTE — Progress Notes (Signed)
Subjective: Resting in bed  Objective: Vital signs in last 24 hours: Temp:  [97.8 F (36.6 C)-98.3 F (36.8 C)] 98.3 F (36.8 C) (09/14 0508) Pulse Rate:  [54-72] 61 (09/14 0508) Resp:  [15-19] 18 (09/14 0508) BP: (128-193)/(68-114) 128/68 (09/14 0508) SpO2:  [96 %-100 %] 96 % (09/14 0508) Weight change:  Last BM Date: 06/03/17  PE: GEN:  Resting in bed ABD:  Mild-to-moderate distention, improved cf. Yesterday  Lab Results:  CBC    Component Value Date/Time   WBC 3.0 (L) 06/03/2017 0004   RBC 3.47 (L) 06/03/2017 0004   HGB 10.8 (L) 06/03/2017 0004   HGB 13.1 12/09/2016 1114   HCT 33.3 (L) 06/03/2017 0004   HCT 40.8 12/09/2016 1114   PLT 148 (L) 06/03/2017 0004   PLT 186 12/09/2016 1114   MCV 96.0 06/03/2017 0004   MCV 91 12/09/2016 1114   MCH 31.1 06/03/2017 0004   MCHC 32.4 06/03/2017 0004   RDW 15.3 06/03/2017 0004   RDW 16.3 (H) 12/09/2016 1114   LYMPHSABS 1.2 05/30/2017 1403   MONOABS 0.3 05/30/2017 1403   EOSABS 0.1 05/30/2017 1403   BASOSABS 0.0 05/30/2017 1403   CMP     Component Value Date/Time   NA 140 06/03/2017 0737   NA 145 (H) 02/17/2017 1204   K 3.2 (L) 06/03/2017 0737   CL 113 (H) 06/03/2017 0737   CO2 20 (L) 06/03/2017 0737   GLUCOSE 63 (L) 06/03/2017 0737   BUN 8 06/03/2017 0737   BUN 14 02/17/2017 1204   CREATININE 0.91 06/03/2017 0737   CREATININE 0.96 12/23/2014 1214   CALCIUM 7.9 (L) 06/03/2017 0737   PROT 6.8 05/30/2017 1403   PROT 6.7 02/17/2017 1204   ALBUMIN 3.7 05/30/2017 1403   ALBUMIN 4.2 02/17/2017 1204   AST 30 05/30/2017 1403   ALT 6 (L) 05/30/2017 1403   ALKPHOS 37 (L) 05/30/2017 1403   BILITOT 0.7 05/30/2017 1403   BILITOT 0.3 02/17/2017 1204   GFRNONAA >60 06/03/2017 0737   GFRNONAA 74 12/23/2014 1214   GFRAA >60 06/03/2017 0737   GFRAA 85 12/23/2014 1214   XRAY:  Expected improvement in volvulus  Assessment:  1.  Sigmoid volvulus, endoscopic decompression x 2 this admission.  Plan:  1.  Increase  mobility as possible. 2.  Replete potassium. 3.  Minimize narcotics or other constipation-causing medications. 4.  Hopefully decompression will help, but it is almost inevitable that the volvulus will continue to recur (whether it takes days, weeks or months) without definitive surgical repair. 5.  Will sign-off; please call with questions; thank you for the consultation.   [name] 06/03/2017, 1:27 PM   Cell [phone] If no answer or after 5 PM call [phone]

## 2017-06-03 NOTE — Progress Notes (Signed)
[place] for Heparin Indication: History of Pulmonary Embolism  Allergies  Allergen Reactions  . Aricept [Donepezil Hcl] Other (See Comments)    Symptomatic Bradycardia.  . Other Other (See Comments)    Shrimp gives him gout  . Shellfish-Derived Products Other (See Comments)    Causes a flare up with Gout    Patient Measurements: Height: 5\' 6"  (167.6 cm) Weight: 149 lb (67.6 kg) IBW/kg (Calculated) : 63.8 Heparin Dosing Weight: 67.6 kg  Vital Signs: Temp: 98.1 F (36.7 C) (09/13 2015) Temp Source: Axillary (09/13 2015) BP: 154/95 (09/13 2015) Pulse Rate: 61 (09/13 2015)  Labs:  Recent Labs  05/31/17 0312  05/31/17 1921 06/01/17 0458 06/02/17 0457 06/03/17 0004  HGB 11.6*  --   --  12.2* 12.1* 10.8*  HCT 37.6*  --   --  37.1* 37.7* 33.3*  PLT 148*  --   --  129* 160 148*  LABPROT 18.5*  --   --  19.1* 18.2*  --   INR 1.56  --   --  1.62 1.52  --   HEPARINUNFRC  --   < > 0.88* 0.79*  --  0.22*  CREATININE 0.94  --   --  0.93 0.84  --   < > = values in this interval not displayed.  Estimated Creatinine Clearance: 59.1 mL/min (by C-G formula based on SCr of 0.84 mg/dL).  Assessment: 83 y.o. male with h/o PE, Coumadin on hold,  for heparin  Goal of Therapy:  Heparin level 0.3-0.7 units/ml Monitor platelets by anticoagulation protocol: Yes   Plan:  Increase Heparin 850 units/hr Follow-up am labs.  Phillis Knack, PharmD, BCPS  06/03/2017,1:19 AM

## 2017-06-04 LAB — BASIC METABOLIC PANEL
Anion gap: 5 (ref 5–15)
CALCIUM: 8.1 mg/dL — AB (ref 8.9–10.3)
CO2: 22 mmol/L (ref 22–32)
Chloride: 111 mmol/L (ref 101–111)
Creatinine, Ser: 0.78 mg/dL (ref 0.61–1.24)
GFR calc Af Amer: >60 mL/min (ref >60)
GLUCOSE: 88 mg/dL (ref 65–99)
Potassium: 3.7 mmol/L (ref 3.5–5.1)
Sodium: 138 mmol/L (ref 135–145)

## 2017-06-04 LAB — CBC
HEMATOCRIT: 35 % — AB (ref 39.0–52.0)
Hemoglobin: 11.2 g/dL — ABNORMAL LOW (ref 13.0–17.0)
MCH: 30.4 pg (ref 26.0–34.0)
MCHC: 32 g/dL (ref 30.0–36.0)
MCV: 95.1 fL (ref 78.0–100.0)
PLATELETS: 146 10*3/uL — AB (ref 150–400)
RBC: 3.68 MIL/uL — ABNORMAL LOW (ref 4.22–5.81)
RDW: 14.9 % (ref 11.5–15.5)
WBC: 3 10*3/uL — ABNORMAL LOW (ref 4.0–10.5)

## 2017-06-04 LAB — HEPARIN LEVEL (UNFRACTIONATED): Heparin Unfractionated: 0.63 IU/mL (ref 0.30–0.70)

## 2017-06-04 LAB — PROTIME-INR
INR: 2.33
Prothrombin Time: 25.4 seconds — ABNORMAL HIGH (ref 11.4–15.2)

## 2017-06-04 MED ORDER — WARFARIN - PHARMACIST DOSING INPATIENT
Freq: Every day | Status: DC
  Administered 2017-06-05: 18:00:00

## 2017-06-04 MED ORDER — WARFARIN SODIUM 5 MG PO TABS: 5.0000 mg | ORAL_TABLET | Freq: Once | ORAL | Status: DC

## 2017-06-04 MED ORDER — WARFARIN SODIUM 5 MG PO TABS
2.5000 mg | ORAL_TABLET | Freq: Once | ORAL | Status: AC
  Administered 2017-06-04: 2.5 mg via ORAL
  Filled 2017-06-04: qty 1

## 2017-06-04 MED ORDER — WARFARIN SODIUM 5 MG PO TABS: 2.5000 mg | ORAL_TABLET | Freq: Once | ORAL | Status: DC

## 2017-06-04 MED ORDER — POTASSIUM CHLORIDE CRYS ER 20 MEQ PO TBCR
40.0000 meq | EXTENDED_RELEASE_TABLET | Freq: Two times a day (BID) | ORAL | Status: AC
  Administered 2017-06-04 (×2): 40 meq via ORAL
  Filled 2017-06-04 (×2): qty 2

## 2017-06-04 NOTE — Progress Notes (Signed)
   Subjective: patient was sleeping when seen this morning, appears comfortable.He had 2 BMs.  Objective:  Vital signs in last 24 hours: Vitals:   06/02/17 2015 06/03/17 0508 06/03/17 1300 06/04/17 0459  BP: (!) 154/95 128/68 (!) 162/90 (!) 155/88  Pulse: 61 61 66 77  Resp: 18 18 18 15   Temp: 98.1 F (36.7 C) 98.3 F (36.8 C) 98.4 F (36.9 C) 98.2 F (36.8 C)  TempSrc: Axillary Axillary Oral Oral  SpO2: 97% 96% 98% 98%  Weight:      Height:       Gen. Well-developed elderly man, in no acute distress.  Abdomen.soft, non tender, bowel sounds positive. Chest. Clear bilaterally. CV. Regular rate and rhythm. Extremities.no edema, no cyanosis, pulses intact and symmetrical.  Assessment/Plan:  Sigmoid Volvulus. Appears clinically decompressed after second partial decompression. Family will go for surgical management if it reoccurs again. - Advance diet As Tolerated. -Continue potassium repletion to keep potassium level about 4.0 -Repeat BMP tomorrow.  History of PE  Pt on chronic warfarin for remote, recurrent PE. On presentation, INR subtherapeutic in setting of reported missed doses at home.he was placed on heparin infusion because of any surgical intervention if needed. -We will start him back on Coumadin today.  Parkinson's Disease, Dementia  Pt with a history of advanced dementia and Parkinson's disease, low functional status and minimally interactive at baseline.  --Continue home memantine, Sinemet, Seroquel     Dispo: Anticipated discharge in approximately 1-2 day(s).   Lorella Nimrod, MD 06/04/2017, 2:37 PM Pager: [phone]

## 2017-06-04 NOTE — Progress Notes (Addendum)
[place] for heparin Indication: history of PE  Allergies  Allergen Reactions  . Aricept [Donepezil Hcl] Other (See Comments)    Symptomatic Bradycardia.  . Other Other (See Comments)    Shrimp gives him gout  . Shellfish-Derived Products Other (See Comments)    Causes a flare up with Gout    Patient Measurements: Height: 5\' 6"  (167.6 cm) Weight: 149 lb (67.6 kg) IBW/kg (Calculated) : 63.8   Vital Signs: Temp: 98.2 F (36.8 C) (09/15 0459) Temp Source: Oral (09/15 0459) BP: 155/88 (09/15 0459) Pulse Rate: 77 (09/15 0459)  Labs:  Recent Labs  06/02/17 0457  06/03/17 0004 06/03/17 0737 06/03/17 1416 06/04/17 0449  HGB 12.1*  --  10.8*  --   --  11.2*  HCT 37.7*  --  33.3*  --   --  35.0*  PLT 160  --  148*  --   --  146*  LABPROT 18.2*  --   --   --   --   --   INR 1.52  --   --   --   --   --   HEPARINUNFRC  --   < > 0.22* 0.42 0.51 0.63  CREATININE 0.84  --   --  0.91  --  0.78  < > = values in this interval not displayed.  Estimated Creatinine Clearance: 62 mL/min (by C-G formula based on SCr of 0.78 mg/dL).   Medications:  Scheduled:  . amLODipine  2.5 mg Oral Daily  . carbidopa-levodopa  1 tablet Oral QID  . memantine  28 mg Oral Daily  . polyethylene glycol  17 g Oral Daily  . potassium chloride  40 mEq Oral BID WC  . pravastatin  10 mg Oral Daily  . QUEtiapine  25 mg Oral QHS  . warfarin  2.5 mg Oral ONCE-1800  . Warfarin - Pharmacist Dosing Inpatient   Does not apply q1800    Assessment: 83yo with history of PE on heparin bridge while Coumadin on hold s/p sigmoidoscopy and decompression due to voluvulus.  Plans to continue medical management through the weekend and potentially resume Warfarin on Monday.  Today heparin level is therapeutic at 0.63 (up slightly from yesterday) but within goal.  His H/H is improved and no noted bleeding complications.  Goal of Therapy:  Heparin level 0.3-0.7 units/ml Monitor  platelets by anticoagulation protocol: Yes   Plan:  Continue heparin 850 units/hr Daily Heparin Level, CBC  Rober Minion, PharmD., MS Clinical Pharmacist Pager:  [phone] Thank you for allowing pharmacy to be part of this patients care team. 06/04/2017 3:01 PM    ADDENDUM Pharmacy consulted to dose warfarin in addition to heparin infusion. INR was 1.52 on 9/13. Last dose of warfarin was on 9/12 at 5 mg. Home regimen is 5 mg Mon/Wed, 2.5 mg all other days. INR was subtherapeutic upon admission, question if missed doses.   INR today came back at 2.33 today. Will order home dose of 2.5 mg tonight. Will monitor closely given fact that INR therapeutic despite held doses. Will continue heparin infusion at this time until get repeat INR level to confirm within therapeutic range. Follow INR/CBC daily.  Thanks, Doylene Canard, PharmD Clinical Pharmacist  Phone: [phone]

## 2017-06-04 NOTE — Progress Notes (Signed)
Internal Medicine Attending:   I saw and examined the patient. I reviewed the resident's note and I agree with the resident's findings and plan as documented in the resident's note. Patient denies any complaints, On exam, +BS abd less distended, heart RRR. A/P Sigmoid volvulus -Appears to be resolved clinically, I discussed this with son walter and daughter andrea. - Potassium still lower than our goal, will given additional supplementation today. -Plan for discharge tomorrow if no set backs.  Hx of PE - OK to resume coumadin per pharm

## 2017-06-04 NOTE — Progress Notes (Signed)
2 Days Post-Op  Subjective: Patient is stable and alert.  Insight somewhat poor, however. Son present in room Patient ate all of his breakfast and reportedly enjoyed this. Has had 2 bowel movements Clinically he is decompressed and there is no evidence of obstruction   Long discussion with son.  I reaffirmed that the son and family does not what any surgery MS he has a third episode of obstruction. I told him that this could happen anytime but there was no way to predict this He asked if he could go home and I said from a surgical standpoint that would be fine.  We will simply standby.   Objective: Vital signs in last 24 hours: Temp:  [98.2 F (36.8 C)-98.4 F (36.9 C)] 98.2 F (36.8 C) (09/15 0459) Pulse Rate:  [66-77] 77 (09/15 0459) Resp:  [15-18] 15 (09/15 0459) BP: (155-162)/(88-90) 155/88 (09/15 0459) SpO2:  [98 %] 98 % (09/15 0459) Last BM Date: 06/03/17  Intake/Output from previous day: 09/14 0701 - 09/15 0700 In: 1000 [I.V.:1000] Out: 2225 [Urine:2225] Intake/Output this shift: Total I/O In: 360 [P.O.:360] Out: -   General appearance: elderly.  No distress.  Answers questions but insight compromised son present throughout the encounter Resp: clear to auscultation bilaterally GI: soft.  Nontender nondistended.  Lab Results:   Recent Labs  06/03/17 0004 06/04/17 0449  WBC 3.0* 3.0*  HGB 10.8* 11.2*  HCT 33.3* 35.0*  PLT 148* 146*   BMET  Recent Labs  06/03/17 0737 06/04/17 0449  NA 140 138  K 3.2* 3.7  CL 113* 111  CO2 20* 22  GLUCOSE 63* 88  BUN 8 <5*  CREATININE 0.91 0.78  CALCIUM 7.9* 8.1*   PT/INR  Recent Labs  06/02/17 0457  LABPROT 18.2*  INR 1.52   ABG No results for input(s): PHART, HCO3 in the last 72 hours.  Invalid input(s): PCO2, PO2  Studies/Results: Dg Abd Portable 1v  Result Date: 06/03/2017 CLINICAL DATA:  Sigmoid volvulus. EXAM: PORTABLE ABDOMEN - 1 VIEW COMPARISON:  June 01, 2017. FINDINGS: Minimally  distended air-filled left colon. Air noted within nondistended loops of small bowel. No pneumoperitoneum. Multiple surgical clips in the pelvis again noted. IMPRESSION: Minimally distended air-filled left colon, which could be related to recent sigmoidoscopy in the absence of recurrent abdominal symptoms. Electronically Signed   By: Titus Dubin M.D.   On: 06/03/2017 10:09    Anti-infectives: Anti-infectives    None      Assessment/Plan: s/p Procedure(s): FLEXIBLE SIGMOIDOSCOPY   Sigmoid volvulus. -Status post decompression September 10 and September 14 -currently decompressed and no evidence of obstruction -Appreciate palliative consult      Plan:  Surgery is available if the patient obstructs again or if the patient/ family wishes to pursue surgical intervention            Patient may be discharged home from a surgical standpoint            Surgery will sign off   LOS: 4 days    [name] M 06/04/2017

## 2017-06-04 NOTE — Progress Notes (Signed)
[place] for heparin Indication: history of PE  Allergies  Allergen Reactions  . Aricept [Donepezil Hcl] Other (See Comments)    Symptomatic Bradycardia.  . Other Other (See Comments)    Shrimp gives him gout  . Shellfish-Derived Products Other (See Comments)    Causes a flare up with Gout    Patient Measurements: Height: 5\' 6"  (167.6 cm) Weight: 149 lb (67.6 kg) IBW/kg (Calculated) : 63.8   Vital Signs: Temp: 98.2 F (36.8 C) (09/15 0459) Temp Source: Oral (09/15 0459) BP: 155/88 (09/15 0459) Pulse Rate: 77 (09/15 0459)  Labs:  Recent Labs  06/02/17 0457  06/03/17 0004 06/03/17 0737 06/03/17 1416 06/04/17 0449  HGB 12.1*  --  10.8*  --   --  11.2*  HCT 37.7*  --  33.3*  --   --  35.0*  PLT 160  --  148*  --   --  146*  LABPROT 18.2*  --   --   --   --   --   INR 1.52  --   --   --   --   --   HEPARINUNFRC  --   < > 0.22* 0.42 0.51 0.63  CREATININE 0.84  --   --  0.91  --  0.78  < > = values in this interval not displayed.  Estimated Creatinine Clearance: 62 mL/min (by C-G formula based on SCr of 0.78 mg/dL).   Medications:  Scheduled:  . amLODipine  2.5 mg Oral Daily  . carbidopa-levodopa  1 tablet Oral QID  . memantine  28 mg Oral Daily  . polyethylene glycol  17 g Oral Daily  . potassium chloride  40 mEq Oral BID WC  . pravastatin  10 mg Oral Daily  . QUEtiapine  25 mg Oral QHS    Assessment: 83yo with history of PE on heparin bridge while Coumadin on hold s/p sigmoidoscopy and decompression due to voluvulus.  Plans to continue medical management through the weekend and potentially resume Warfarin on Monday.  Today heparin level is therapeutic at 0.63 (up slightly from yesterday) but within goal.  His H/H is improved and no noted bleeding complications.  Goal of Therapy:  Heparin level 0.3-0.7 units/ml Monitor platelets by anticoagulation protocol: Yes   Plan:  Continue heparin 850 units/hr Daily Heparin  Level, CBC  Rober Minion, PharmD., MS Clinical Pharmacist Pager:  [phone] Thank you for allowing pharmacy to be part of this patients care team. 06/04/2017 8:36 AM

## 2017-06-05 ENCOUNTER — Inpatient Hospital Stay (HOSPITAL_COMMUNITY): Payer: Medicare HMO

## 2017-06-05 LAB — CBC
HEMATOCRIT: 36.4 % — AB (ref 39.0–52.0)
HEMOGLOBIN: 11.6 g/dL — AB (ref 13.0–17.0)
MCH: 30.4 pg (ref 26.0–34.0)
MCHC: 31.9 g/dL (ref 30.0–36.0)
MCV: 95.5 fL (ref 78.0–100.0)
Platelets: 163 10*3/uL (ref 150–400)
RBC: 3.81 MIL/uL — AB (ref 4.22–5.81)
RDW: 14.8 % (ref 11.5–15.5)
WBC: 3.3 10*3/uL — AB (ref 4.0–10.5)

## 2017-06-05 LAB — BASIC METABOLIC PANEL
ANION GAP: 4 — AB (ref 5–15)
CHLORIDE: 111 mmol/L (ref 101–111)
CO2: 23 mmol/L (ref 22–32)
Calcium: 8.4 mg/dL — ABNORMAL LOW (ref 8.9–10.3)
Creatinine, Ser: 0.72 mg/dL (ref 0.61–1.24)
GFR calc Af Amer: >60 mL/min (ref >60)
GFR calc non Af Amer: >60 mL/min (ref >60)
Glucose, Bld: 90 mg/dL (ref 65–99)
POTASSIUM: 4.2 mmol/L (ref 3.5–5.1)
SODIUM: 138 mmol/L (ref 135–145)

## 2017-06-05 LAB — HEPARIN LEVEL (UNFRACTIONATED): HEPARIN UNFRACTIONATED: 0.57 [IU]/mL (ref 0.30–0.70)

## 2017-06-05 LAB — MAGNESIUM: MAGNESIUM: 1.6 mg/dL — AB (ref 1.7–2.4)

## 2017-06-05 LAB — PROTIME-INR
INR: 2.59
Prothrombin Time: 27.5 seconds — ABNORMAL HIGH (ref 11.4–15.2)

## 2017-06-05 MED ORDER — WARFARIN SODIUM 5 MG PO TABS
2.5000 mg | ORAL_TABLET | Freq: Once | ORAL | Status: AC
  Administered 2017-06-05: 2.5 mg via ORAL
  Filled 2017-06-05: qty 1

## 2017-06-05 MED ORDER — HEPARIN (PORCINE) IN NACL 100-0.45 UNIT/ML-% IJ SOLN
850.0000 [IU]/h | INTRAMUSCULAR | Status: DC
  Filled 2017-06-05: qty 250

## 2017-06-05 NOTE — Progress Notes (Signed)
   Subjective: Patient was laying comfortably in bed this morning. Denies any pain. No complaints this morning.  Objective:  Vital signs in last 24 hours: Vitals:   06/04/17 0459 06/04/17 1507 06/04/17 2106 06/05/17 0453  BP: (!) 155/88 (!) 158/97 (!) 166/97 138/86  Pulse: 77 72 71 (!) 57  Resp: 15 16 16 16   Temp: 98.2 F (36.8 C) 98.2 F (36.8 C) 98.4 F (36.9 C) 98.4 F (36.9 C)  TempSrc: Oral Axillary Oral Oral  SpO2: 98% 98% 100% 98%  Weight:      Height:       Gen. Well-developed elderly man, in no acute distress.  Abdomen.soft, non tender, bowel sounds positive. Chest. Clear bilaterally. CV. Regular rate and rhythm. Extremities.no edema, no cyanosis, pulses intact and symmetrical.  Assessment/Plan:  Sigmoid Volvulus. Appears clinically decompressed after second partial decompression. Family will go for surgical management if it reoccurs again. Potassium 4.2 this AM - Advancing diet As Tolerated.  History of PE  Pt on chronic warfarin for remote, recurrent PE. On presentation, INR subtherapeutic in setting of reported missed doses at home.he was placed on heparin infusion because of any surgical intervention if needed. Restarted on Coumadin 9/15.  Parkinson's Disease, Dementia  Pt with a history of advanced dementia and Parkinson's disease, low functional status and minimally interactive at baseline.  --Continue home memantine, Sinemet, Seroquel     Dispo: Anticipated discharge in approximately 0-1 day(s).   Neva Seat, MD 06/05/2017, 9:22 AM Pager: [phone]

## 2017-06-05 NOTE — Discharge Summary (Signed)
Name: [name] [name] MRN: [id number] DOB: [birth year] 83 y.o. PCP: [name] Groves, DO  Date of Admission: 05/30/2017  1:42 PM Date of Discharge: 06/14/2017 Attending Physician: Sid Falcon, MD  Discharge Diagnosis:  Principal Problem:   Volvulus of colon [facility]) Active Problems:   Parkinsonian syndrome [facility])   Palliative care encounter  Discharge Medications: Allergies as of 06/14/2017      Reactions   Aricept [donepezil Hcl] Other (See Comments)   Symptomatic Bradycardia.   Other Other (See Comments)   Shrimp gives him gout   Shellfish-derived Products Other (See Comments)   Causes a flare up with Gout      Medication List    TAKE these medications   acetaminophen 500 MG tablet Commonly known as:  TYLENOL Take 1,000 mg by mouth 2 (two) times daily.   amLODipine 5 MG tablet Commonly known as:  NORVASC Take 1 tablet (5 mg total) by mouth daily. What changed:  medication strength  how much to take   aspirin 81 MG chewable tablet Chew 81 mg by mouth daily as needed (chest pain).   b complex vitamins tablet Take 1 tablet by mouth every morning.   Ca Phosphate-Cholecalciferol 250-350 MG-UNIT Chew Commonly known as:  CALCIUM/VITAMIN D3 GUMMIES Chew 1 Dose by mouth 2 (two) times daily.   carbidopa-levodopa 25-100 MG tablet Commonly known as:  SINEMET IR Take 1 tablet by mouth 4 (four) times daily.   fish oil-omega-3 fatty acids 1000 MG capsule Take 2 g by mouth every morning.   memantine 28 MG Cp24 24 hr capsule Commonly known as:  NAMENDA XR Take 1 capsule (28 mg total) by mouth every morning.   multivitamin with minerals tablet Take 1 tablet by mouth every morning.   polyethylene glycol packet Commonly known as:  MIRALAX / GLYCOLAX Take 17 g by mouth daily as needed. What changed:  reasons to take this   potassium chloride SA 20 MEQ tablet Commonly known as:  KLOR-CON M20 Take 2 tablets (40 mEq total) by mouth daily.   pravastatin 10 MG  tablet Commonly known as:  PRAVACHOL Take 1 tablet (10 mg total) by mouth daily.   QUEtiapine 25 MG tablet Commonly known as:  SEROQUEL Take 1 tablet (25 mg total) by mouth at bedtime. May use half a tablet 12.5 mg during the daytime as needed for agitation/hallucinations   senna 8.6 MG Tabs tablet Commonly known as:  SENOKOT Take 1 tablet (8.6 mg total) by mouth daily as needed for mild constipation.   vitamin C 500 MG tablet Commonly known as:  ASCORBIC ACID Take 1,000 mg by mouth every morning.   warfarin 5 MG tablet Commonly known as:  COUMADIN Take 0.5-1 tablets (2.5-5 mg total) by mouth See admin instructions. Take 1 tablet on Mon, and Fri. Take 1/2 tablet all other days What changed:  additional instructions            Discharge Care Instructions        Start     Ordered   06/15/17 0000  amLODipine (NORVASC) 5 MG tablet  Daily     06/14/17 1532   06/14/17 0000  Increase activity slowly     06/14/17 1532   06/14/17 0000  Diet - low sodium heart healthy     06/14/17 1532   06/14/17 0000  Discharge instructions    Comments:  Thank you for allowing [name] to care for you.   Your symptoms were you due to obstruction of your  large intestine caused by it twisting.  Please follow up with your Primary Care Provider on 06/27/17 Please follow up in the surgery surgery clinic  We have CHANGED your Amlodipine prescription to 5mg  Daily  Be sure to take your Potassium supplement at home as your levels were lower while admitted.   06/14/17 1532   06/14/17 0000  Call MD for:  temperature >100.4     06/14/17 1532   06/14/17 0000  Call MD for:  redness, tenderness, or signs of infection (pain, swelling, redness, odor or green/yellow discharge around incision site)     06/14/17 1532   06/14/17 0000  Call MD for:  severe uncontrolled pain     06/14/17 1532   06/05/17 0000  Increase activity slowly     06/05/17 1406   06/05/17 0000  Diet - low sodium heart healthy     06/05/17  1406      Disposition and follow-up:   [name] was discharged from [facility] in Stable condition.  At the hospital follow up visit please address:  1.  - Ensure continued appropriate bowl function. - Reassess blood pressure. Patient experienced hypertension while admitted and amlodipine was increased from 2.5 - 5 mg  - Evaluate patients potassium status, discharged on home PO potassium 57mEq daily  2.  Labs / imaging needed at time of follow-up: BMP  3.  Pending labs/ test needing follow-up: None  Follow-up Appointments: Follow-up Information    [name] Groves, DO Follow up.   Specialty:  Internal Medicine Why:  please call and make an appointment with [name] for 06/23/2017. Contact information: [name] [name] [address] [phone]        [name] Skeans, MD. Schedule an appointment as soon as possible for a visit in 1 week(s).   Specialty:  General Surgery Contact information: [address] [place] [address] [phone]           Hospital Course by problem list:   Sigmoid Volvulus The pt has a history of sigmoid volvulus in Nov 2017 which was decompressed via flexible sigmoidoscopy. He presented with abdominal distension, constipation and found to have recurrent volvulus on abdominal XR and CT scan. GI was consulted and performed a decompression via flex sig. Within 48 hrs, the pt again developed worsened abdominal distension and pain and a rpt KUB showed concern for recurrent volvulus. A repeat flex sig was performed which achieved partial decompression. On presentation, the pt's family wished to avoid surgical resection given his age and co-morbidities. They were counseled on the likelihood of recurrence and that decompression via endoscopy is not a definitive treatment. When the volvulus recurred, discussions regarding surgery were revisited. They indicated they would opt for surgery if the volvulus  recurred a third time. He was monitored for another 48 hours with no new abdominal distension or pain suggesting recurrence. His potassium was regularly low-low normal which was aggressively repleted to achieve a goal of 4.5-5. On 9/16, before he was to be discharged, KUB showed increased distention in his colon, more consistent with repeat sigmoid volvulus. His family opted to have remain in the hospital and to pursue surgical intervention with this 3rd episode. Patient underwent a third decompression prior to surgery and then had a successful sigmoid colectomy performed on 9/19. The patient's diet was advanced and he began to have bowl movements. He experienced one episode or BRBPR, which was determined to be from his colon staple line.  He experienced no drop in Hgb and no recurrence of this bleeding. He will need 24 hour care following discharge and the family has opted to care for him at home. Patient discharged home once family had received appropriate home health supplies.  Parkinson Disease Pt has a history of Parkinson Disease with dementia. His home medications were continued throughout the admission.  Advanced Directives  The pt was full code this encounter. The pt's daughter is the healthcare power of attorney for Mr. Alford Highland. They understandably wished to pursue non-operative management for his sigmoid volvulus. Further discussions regarding a more palliative approach considering his co-morbidities including advanced prostate cancer were held and palliative care consulted. The daughter noted that the pt's wishes were to do everything possible to keep him alive, and has maintained his code status as full in accordance with these wishes. She also feels that Mr. Vanwagoner does maintain a fair quality of life as his is able to feed himself, ambulate with assistance, and have meaningful interactions with family.   Discharge Vitals:   BP (!) 123/94 Comment: After hydralazine IV  Pulse 92   Temp  98.9 F (37.2 C) (Oral)   Resp 16   Ht 5\' 6"  (1.676 m)   Wt 149 lb (67.6 kg)   SpO2 96%   BMI 24.05 kg/m   Pertinent Labs, Studies, and Procedures:  BMP Latest Ref Rng & Units 06/14/2017 06/13/2017 06/12/2017  Glucose 65 - 99 mg/dL 124(H) 107(H) 92  BUN 6 - 20 mg/dL 13 7 6   Creatinine 0.61 - 1.24 mg/dL 0.71 0.63 0.65  BUN/Creat Ratio 10 - 24 - - -  Sodium 135 - 145 mmol/L 138 137 137  Potassium 3.5 - 5.1 mmol/L 3.6 3.6 3.4(L)  Chloride 101 - 111 mmol/L 105 101 101  CO2 22 - 32 mmol/L 26 29 29   Calcium 8.9 - 10.3 mg/dL 8.6(L) 8.6(L) 8.6(L)    Discharge Instructions: Discharge Instructions    Call MD for:  redness, tenderness, or signs of infection (pain, swelling, redness, odor or green/yellow discharge around incision site)    Complete by:  As directed    Call MD for:  severe uncontrolled pain    Complete by:  As directed    Call MD for:  temperature >100.4    Complete by:  As directed    Diet - low sodium heart healthy    Complete by:  As directed    Diet - low sodium heart healthy    Complete by:  As directed    Discharge instructions    Complete by:  As directed    Thank you for allowing [name] to care for you.   Your symptoms were you due to obstruction of your large intestine caused by it twisting.  Please follow up with your Primary Care Provider on 06/27/17 Please follow up in the surgery surgery clinic  We have CHANGED your Amlodipine prescription to 5mg  Daily  Be sure to take your Potassium supplement at home as your levels were lower while admitted.   Increase activity slowly    Complete by:  As directed    Increase activity slowly    Complete by:  As directed       Signed: Neva Seat, MD 06/15/2017, 9:05 AM   Pager: [phone]

## 2017-06-05 NOTE — Progress Notes (Signed)
Internal Medicine Attending:   I saw and examined the patient. I reviewed the resident's note and I agree with the resident's findings and plan as documented in the resident's note. This AM evaluated patient with daughter [name] [name] at bedside, patient without complaints ready- wants to go home.  On exam he appeared comfortable, very mild abd distension which appeared improved.  A/P Recurrent Sigmoid Volvulus  -This AM appeared patient was doing well, discussed with daughter will need to continue potassium supplementation but could recur at any time.  Unfortunately this afternoon prior to formal discharge daughter appreciated increased abdominal swelling, Dr Reesa Chew reevaluated and discussed with me, we repeated KUB which shows the volvulus has recurred. - Cancel discharge, will have surgery reevaluate in AM as plan was for surgery if this recurred a third time. - NPO - Hold further warfarin.  Hx of PE - Hold warfrin may resume heparin drip.

## 2017-06-05 NOTE — Progress Notes (Signed)
His daughter was worried that his abdomen seems little tight again. Patient was ready to go home. On evaluation he denies any abdominal pain, nausea or vomiting. He had bowel movement this morning.  On exam, abdomen was soft, non tender and bowel sounds are positive.  KUB was ordered which shows increased distention in his colon More consistent with sigmoid volvulus. Patient remained asymptomatic-family is deciding among themselves whether to take him home or pursue with surgery.

## 2017-06-05 NOTE — Progress Notes (Signed)
Patient discharged to home. Discharge instructions was reviewed with patient daughter and a copy was provided to them as well. Patient daughter agreed and verbalized understanding with no further questions at this moment. Patient left unit via wheelchair accompanied by his daughter.  Vergia Alberts n 06/05/17

## 2017-06-05 NOTE — Progress Notes (Signed)
[place] for heparin Indication: history of PE  Allergies  Allergen Reactions  . Aricept [Donepezil Hcl] Other (See Comments)    Symptomatic Bradycardia.  . Other Other (See Comments)    Shrimp gives him gout  . Shellfish-Derived Products Other (See Comments)    Causes a flare up with Gout    Patient Measurements: Height: 5\' 6"  (167.6 cm) Weight: 149 lb (67.6 kg) IBW/kg (Calculated) : 63.8   Vital Signs: Temp: 98.5 F (36.9 C) (09/16 1303) Temp Source: Axillary (09/16 1303) BP: 167/99 (09/16 1303) Pulse Rate: 65 (09/16 1303)  Labs:  Recent Labs  06/03/17 0004 06/03/17 0737 06/03/17 1416 06/04/17 0449 06/04/17 1514 06/05/17 0437  HGB 10.8*  --   --  11.2*  --  11.6*  HCT 33.3*  --   --  35.0*  --  36.4*  PLT 148*  --   --  146*  --  163  LABPROT  --   --   --   --  25.4* 27.5*  INR  --   --   --   --  2.33 2.59  HEPARINUNFRC 0.22* 0.42 0.51 0.63  --  0.57  CREATININE  --  0.91  --  0.78  --  0.72    Estimated Creatinine Clearance: 62 mL/min (by C-G formula based on SCr of 0.72 mg/dL).    Assessment: 83yo with history of PE on heparin bridge while Coumadin on hold s/p sigmoidoscopy and decompression due to voluvulus.    Heparin was originally stopped earlier this afternoon and patient was supposed to start warfarin this evening. Warfarin is now being held again in anticipation of procedures and patient to bridge with heparin. Heparin level was therapeutic at 0.57 this morning at 850 units/hr. CBC has been stable and no signs of bleeding noted.   Goal of Therapy:  Heparin level 0.3-0.7 units/ml Monitor platelets by anticoagulation protocol: Yes   Plan:  Re-start heparin at 850 units/hr Check level with AM labs  Daily Heparin Level, CBC Monitor for signs/symptoms of bleeding   Jimmy Footman, PharmD PGY2 Infectious Diseases Pharmacy Resident  Pager:  [phone] Thank you for allowing pharmacy to be part of this  patients care team. 06/05/2017 7:48 PM

## 2017-06-05 NOTE — Progress Notes (Signed)
ANTICOAGULATION CONSULT NOTE - Follow Up Consult  Pharmacy Consult for Coumadin Indication: history of PE  Allergies  Allergen Reactions  . Aricept [Donepezil Hcl] Other (See Comments)    Symptomatic Bradycardia.  . Other Other (See Comments)    Shrimp gives him gout  . Shellfish-Derived Products Other (See Comments)    Causes a flare up with Gout    Patient Measurements: Height: 5\' 6"  (167.6 cm) Weight: 149 lb (67.6 kg) IBW/kg (Calculated) : 63.8   Vital Signs: Temp: 98.4 F (36.9 C) (09/16 0453) Temp Source: Oral (09/16 0453) BP: 138/86 (09/16 0453) Pulse Rate: 57 (09/16 0453)  Labs:  Recent Labs  06/03/17 0004 06/03/17 0737 06/03/17 1416 06/04/17 0449 06/04/17 1514 06/05/17 0437  HGB 10.8*  --   --  11.2*  --  11.6*  HCT 33.3*  --   --  35.0*  --  36.4*  PLT 148*  --   --  146*  --  163  LABPROT  --   --   --   --  25.4* 27.5*  INR  --   --   --   --  2.33 2.59  HEPARINUNFRC 0.22* 0.42 0.51 0.63  --  0.57  CREATININE  --  0.91  --  0.78  --  0.72    Estimated Creatinine Clearance: 62 mL/min (by C-G formula based on SCr of 0.72 mg/dL).   Medications:  Scheduled:  . amLODipine  2.5 mg Oral Daily  . carbidopa-levodopa  1 tablet Oral QID  . memantine  28 mg Oral Daily  . polyethylene glycol  17 g Oral Daily  . pravastatin  10 mg Oral Daily  . QUEtiapine  25 mg Oral QHS  . Warfarin - Pharmacist Dosing Inpatient   Does not apply q1800    Assessment: 83yo male with history of PE, on heparin bridge to Coumadin with INR at goal x 2.  Spoke with MD, ok to d/c Heparin.  Plan is to discharge today, recommended resuming home dose as pt had been therapeutic x 5 months pta without dosage change.  INR was subtherapeutic on admission, but may have missed doses with GI issues on presentation.  Goal of Therapy:  INR 2-3 Monitor platelets by anticoagulation protocol: Yes   Plan:  Coumadin 2.5mg  po today Daily INR D/C heparin and all heaprin labs  Lewie Chamber., PharmD [facility]

## 2017-06-06 ENCOUNTER — Inpatient Hospital Stay (HOSPITAL_COMMUNITY): Payer: Medicare HMO

## 2017-06-06 ENCOUNTER — Ambulatory Visit (INDEPENDENT_AMBULATORY_CARE_PROVIDER_SITE_OTHER): Payer: Medicare HMO | Admitting: *Deleted

## 2017-06-06 DIAGNOSIS — G219 Secondary parkinsonism, unspecified: Secondary | ICD-10-CM

## 2017-06-06 DIAGNOSIS — R55 Syncope and collapse: Secondary | ICD-10-CM | POA: Diagnosis not present

## 2017-06-06 DIAGNOSIS — R140 Abdominal distension (gaseous): Secondary | ICD-10-CM

## 2017-06-06 LAB — CBC
HCT: 35.3 % — ABNORMAL LOW (ref 39.0–52.0)
HEMOGLOBIN: 11.4 g/dL — AB (ref 13.0–17.0)
MCH: 30.8 pg (ref 26.0–34.0)
MCHC: 32.3 g/dL (ref 30.0–36.0)
MCV: 95.4 fL (ref 78.0–100.0)
Platelets: 161 10*3/uL (ref 150–400)
RBC: 3.7 MIL/uL — AB (ref 4.22–5.81)
RDW: 15.2 % (ref 11.5–15.5)
WBC: 2.5 10*3/uL — ABNORMAL LOW (ref 4.0–10.5)

## 2017-06-06 LAB — BASIC METABOLIC PANEL
ANION GAP: 5 (ref 5–15)
BUN: <5 mg/dL — ABNORMAL LOW (ref 6–20)
CALCIUM: 8.5 mg/dL — AB (ref 8.9–10.3)
CHLORIDE: 109 mmol/L (ref 101–111)
CO2: 24 mmol/L (ref 22–32)
Creatinine, Ser: 0.9 mg/dL (ref 0.61–1.24)
GFR calc non Af Amer: >60 mL/min (ref >60)
Glucose, Bld: 105 mg/dL — ABNORMAL HIGH (ref 65–99)
POTASSIUM: 3.7 mmol/L (ref 3.5–5.1)
Sodium: 138 mmol/L (ref 135–145)

## 2017-06-06 LAB — PROTIME-INR
INR: 2.63
PROTHROMBIN TIME: 27.9 s — AB (ref 11.4–15.2)

## 2017-06-06 MED ORDER — LACTATED RINGERS IV SOLN
INTRAVENOUS | Status: DC
  Administered 2017-06-06: 12:00:00 via INTRAVENOUS
  Filled 2017-06-06: qty 1000

## 2017-06-06 MED ORDER — QUETIAPINE FUMARATE 50 MG PO TABS
25.0000 mg | ORAL_TABLET | Freq: Every day | ORAL | Status: DC
  Administered 2017-06-06 – 2017-06-07 (×2): 25 mg via ORAL
  Filled 2017-06-06 (×3): qty 1

## 2017-06-06 MED ORDER — VITAMIN K1 10 MG/ML IJ SOLN
5.0000 mg | Freq: Once | INTRAVENOUS | Status: AC
  Administered 2017-06-06: 5 mg via INTRAVENOUS
  Filled 2017-06-06: qty 0.5

## 2017-06-06 NOTE — Progress Notes (Signed)
Carelink Summary Report / Loop Recorder 

## 2017-06-06 NOTE — Progress Notes (Signed)
[facility] Gastroenterology Progress Note  Daquavion Catala 83 y.o. [birth year]  CC:  Recurrent sigmoid volvulus   Subjective:  GI is asked to reevaluate the patient again for another sigmoid volvulus decompression prior to surgical intervention for recurrent sigmoid volvulus. Patient underwent flexible sigmoidoscopy and decompression twice during this admission. (09/10 and 09/13).   Patient seen and examined at bedside. Not available at bedside. Patient sitting comfortably in the recliner. His abdominal pain is improving.  he had a small bowel movement yesterday according to daughter. Denied any nausea and vomiting.  ROS : negative for chest pain shortness of breath   Objective: Vital signs in last 24 hours: Vitals:   06/05/17 2308 06/06/17 0651  BP: (!) 142/80 140/82  Pulse: 62 67  Resp: 17 17  Temp: 98.3 F (36.8 C) 98.2 F (36.8 C)  SpO2: 99% 99%    Physical Exam:  General:  Alert, cooperative, no distress, appears stated age     Eyes:  , EOM's intact,   Lungs:   Clear to auscultation bilaterally, respirations unlabored  Heart:  Regular rate and rhythm, S1, S2 normal  Abdomen:   Mild distended. Mild discomfort without any significant tenderness to palpation, bowel sounds present. No peritoneal signs.  Extremities: Extremities normal, atraumatic, no  edema       Lab Results:  Recent Labs  06/05/17 0437 06/06/17 0303  NA 138 138  K 4.2 3.7  CL 111 109  CO2 23 24  GLUCOSE 90 105*  BUN <5* <5*  CREATININE 0.72 0.90  CALCIUM 8.4* 8.5*  MG 1.6*  --    No results for input(s): AST, ALT, ALKPHOS, BILITOT, PROT, ALBUMIN in the last 72 hours.  Recent Labs  06/05/17 0437 06/06/17 0303  WBC 3.3* 2.5*  HGB 11.6* 11.4*  HCT 36.4* 35.3*  MCV 95.5 95.4  PLT 163 161    Recent Labs  06/05/17 0437 06/06/17 0303  LABPROT 27.5* 27.9*  INR 2.59 2.63      Assessment/Plan: - recurrent sigmoid volvulus.status post decompression twice during this admission. - history  of pulmonary embolism.last dose of Coumadin yesterday evening. INR 2.63.  Recommendation ------------------------ - Surgery team has requested another decompression to help with colon preparation prior to his surgical intervention.  - patient has INR of 2.63. Last dose of Coumadin was yesterday. - Monitor INR daily. Plan for Flex sig for  decompression once INR is less than 1.8. - discussed with surgery PA as well as pharmacist. Patient's Coumadin is on hold now. Plan is for heparin bridge once INR is less than 2. his heparin needs to be on hold for 6 hour prior to any endoscopic intervention. - Okay to have clear liquid diet for now. Nothing by mouth past midnight. GI will follow   Otis Brace MD, [place] 06/06/2017, 1:10 PM  Pager [phone]  If no answer or after 5 PM call [phone]

## 2017-06-06 NOTE — Progress Notes (Signed)
Patient ID: [name] [name], [name]   DOB: 31-Aug-1931, 83 y.o.   MRN: [id number] I spoke with him and his daughter. Abd soft, no peritonitis. Coumadin held. Will plan to try to prep colon after GI decompression in preparation of partial colectomy later this week.  Georganna Skeans, MD, MPH, FACS Trauma: [phone] General Surgery: [phone]

## 2017-06-06 NOTE — Progress Notes (Signed)
  Date: 06/06/2017  Patient name: [name] [name]  Medical record number: [id number]  Date of birth: 01/25/1931   I have seen and evaluated this patient and I have discussed the plan of care with the house staff. Please see Dr. Tally Joe note for complete details. I concur with his findings.  Sid Falcon, MD 06/06/2017, 4:25 PM

## 2017-06-06 NOTE — Progress Notes (Signed)
Pt was ordered to be discharge going home but the family decided to do the surgery, all orders was been discontinued and resume the IVF and oral meds, but I called the pharmacist Delsa Sale regarding heparin drip, she ordered PT/INR and hold the heparin drip for now.

## 2017-06-06 NOTE — Progress Notes (Signed)
Surgery:  We were called back to evaluate this patient for possible recurrent sigmoid volvulus Full note to follow  He has not had any nausea or vomiting and apparently had a bowel movement last night However, on exam he seems a little bit more uncomfortable and his abdomen is a little bit distended and tympanitic.  Nothing dramatic Abdominal x-rays suggest recurrent sigmoid volvulus  There is no evidence of peritonitis, perforation or ischemia at this point in time  I talked with him and his daughter.  His insight is limited. Surgical options at this point are:  1) repeat colonoscopic decompression, followed by gentle bowel prep and then laparotomy with attempt at single-stage resection .  2)  The other option is to proceed to surgery for colectomy and colostomy, which would likely be permanent The daughter wants [name] to try to do a single-stage resection.  She knows that operative findings may dictate diverting colostomy regardless.  She accepts that. High risk.  We will discuss the situation with Dr. Georganna Skeans who is the managing surgeon for our acute care services this week. Once we discussed this with him we will call [facility] back in.   Edsel Petrin. Dalbert Batman, M.D., [facility] General and Minimally invasive Surgery Breast and Colorectal Surgery Office:   [phone] Pager:   [phone]

## 2017-06-06 NOTE — Progress Notes (Signed)
[facility] Progress Note  4 Days Post-Op  Subjective: CC:  Daughter at bedside. Reports that they were supposed to go home yesterday when abdominal distention occurred. AXR revealed a tapered appearance of gas in the distal sigmoid colon concerning for recurrence of volvulus. Per daughter, pt tolerated clears today and had a BM yesterday. That patient denies pain. At baseline he lives with his daughter who cares for both him and his wife.  Objective: Vital signs in last 24 hours: Temp:  [98.2 F (36.8 C)-98.5 F (36.9 C)] 98.2 F (36.8 C) (09/17 0651) Pulse Rate:  [62-67] 67 (09/17 0651) Resp:  [17-18] 17 (09/17 0651) BP: (140-167)/(80-99) 140/82 (09/17 0651) SpO2:  [99 %-100 %] 99 % (09/17 0651) Last BM Date: 06/05/17  Intake/Output from previous day: 09/16 0701 - 09/17 0700 In: 2998.7 [P.O.:960; I.V.:2038.7] Out: 2650 [Urine:2650] Intake/Output this shift: No intake/output data recorded.  PE: Gen:  Alert, NAD, minimally responsive to questions Pulm:  Normal effort, clear to auscultation bilaterally Abd: Soft, mild distention, grunting/wincing to deep palpation suggesting mild tenderness, no peritonitis  Skin: warm and dry, no rashes    Lab Results:   Recent Labs  06/05/17 0437 06/06/17 0303  WBC 3.3* 2.5*  HGB 11.6* 11.4*  HCT 36.4* 35.3*  PLT 163 161   BMET  Recent Labs  06/05/17 0437 06/06/17 0303  NA 138 138  K 4.2 3.7  CL 111 109  CO2 23 24  GLUCOSE 90 105*  BUN <5* <5*  CREATININE 0.72 0.90  CALCIUM 8.4* 8.5*   PT/INR  Recent Labs  06/05/17 0437 06/06/17 0303  LABPROT 27.5* 27.9*  INR 2.59 2.63   CMP     Component Value Date/Time   NA 138 06/06/2017 0303   NA 145 (H) 02/17/2017 1204   K 3.7 06/06/2017 0303   CL 109 06/06/2017 0303   CO2 24 06/06/2017 0303   GLUCOSE 105 (H) 06/06/2017 0303   BUN <5 (L) 06/06/2017 0303   BUN 14 02/17/2017 1204   CREATININE 0.90 06/06/2017 0303   CREATININE 0.96 12/23/2014 1214   CALCIUM 8.5 (L) 06/06/2017 0303   PROT 6.8 05/30/2017 1403   PROT 6.7 02/17/2017 1204   ALBUMIN 3.7 05/30/2017 1403   ALBUMIN 4.2 02/17/2017 1204   AST 30 05/30/2017 1403   ALT 6 (L) 05/30/2017 1403   ALKPHOS 37 (L) 05/30/2017 1403   BILITOT 0.7 05/30/2017 1403   BILITOT 0.3 02/17/2017 1204   GFRNONAA >60 06/06/2017 0303   GFRNONAA 74 12/23/2014 1214   GFRAA >60 06/06/2017 0303   GFRAA 85 12/23/2014 1214   Lipase     Component Value Date/Time   LIPASE 27 05/30/2017 1403       Studies/Results: Dg Abd 1 View  Result Date: 06/06/2017 CLINICAL DATA:  83 year old male with sigmoid volvulus on recent CT Abdomen and Pelvis and Status post flexible sigmoidoscopy decompression on both 05/30/2017 and 06/02/2017. EXAM: ABDOMEN - 1 VIEW COMPARISON:  06/05/2017 and earlier. FINDINGS: Continued gas-filled large bowel and a tapered appearance of the distal sigmoid colon (arrows) although the massive sigmoid distention seen on 05/30/2017 has regressed. Sigmoid distention has also further regressed since 06/05/2017, now the sigmoid diameter is estimated at 8-9 cm. No dilated small bowel. Numerous pelvic surgical clips again noted. dextroconvex lumbar scoliosis with advanced lumbar disc and endplate degeneration. No acute osseous abnormality identified. IMPRESSION: Continued tapered appearance of gas in the distal sigmoid colon suspicious for degree of residual volvulus. However, gaseous distension of the sigmoid  has regressed over this series of exams, now 8-9 cm diameter. Electronically Signed   By: Genevie Ann M.D.   On: 06/06/2017 10:20   Dg Abd 1 View  Result Date: 06/05/2017 CLINICAL DATA:  Sigmoid volvulus follow-up. EXAM: ABDOMEN - 1 VIEW COMPARISON:  06/03/2017. FINDINGS: Gaseous distension of the sigmoid colon is again noted. Maximum diameter is is 10.1 cm which is not significantly improved from previous exam. Acute air-filled loops of large and small bowel identified elsewhere. No evidence for  pneumoperitoneum. IMPRESSION: Persistent dilated loop of sigmoid colon compatible with sigmoid volvulus. Electronically Signed   By: Kerby Moors M.D.   On: 06/05/2017 16:30    Anti-infectives: Anti-infectives    None     Assessment/Plan Alzheimers/Parkinson's dementia/memory loss - followed by [facility] Neurology Pituitary microadenoma Hx of PE - holding coumadin HTN Scoliosis  Sigmoid volvulus  - recurrent s/p decompression 9/10 and 9/14 - appreciate palliative consult, family wants surgical intervention with the goal of bringing their father back home. - patient is non-toxic appearing and having bowel function. Surgical options include re-consulting GI for decompression followed by a gentle bowel prep, sigmoidectomy and possible primary anastomosis. Alternatively the patient would undergo hartmann's procedure with sigmoid colectomy/colostomy. Family prefers to avoid colostomy if possible.   Will call GI (Dr. Alessandra Bevels on call for [facility]) and discuss final surgical plan with MD. NPO except sips with meds. Hold coumadin for possible surgery.     LOS: 6 days    Jill Alexanders , [facility] 06/06/2017, 11:39 AM Pager: [phone] Consults: [phone] Mon-Fri 7:00 am-4:30 pm Sat-Sun 7:00 am-11:30 am

## 2017-06-06 NOTE — Care Management Important Message (Signed)
Important Message  Patient Details  Name: [name] [name] MRN: [id number] Date of Birth: 1930/11/16   Medicare Important Message Given:  Yes    Zuleika Gallus 06/06/2017, 12:08 PM

## 2017-06-06 NOTE — Progress Notes (Signed)
[place] for heparin Indication: history of PE  Allergies  Allergen Reactions  . Aricept [Donepezil Hcl] Other (See Comments)    Symptomatic Bradycardia.  . Other Other (See Comments)    Shrimp gives him gout  . Shellfish-Derived Products Other (See Comments)    Causes a flare up with Gout    Patient Measurements: Height: 5\' 6"  (167.6 cm) Weight: 149 lb (67.6 kg) IBW/kg (Calculated) : 63.8   Vital Signs: Temp: 98.2 F (36.8 C) (09/17 0651) Temp Source: Oral (09/16 2308) BP: 140/82 (09/17 0651) Pulse Rate: 67 (09/17 0651)  Labs:  Recent Labs  06/03/17 1416  06/04/17 0449 06/04/17 1514 06/05/17 0437 06/06/17 0303  HGB  --   < > 11.2*  --  11.6* 11.4*  HCT  --   --  35.0*  --  36.4* 35.3*  PLT  --   --  146*  --  163 161  LABPROT  --   --   --  25.4* 27.5* 27.9*  INR  --   --   --  2.33 2.59 2.63  HEPARINUNFRC 0.51  --  0.63  --  0.57  --   CREATININE  --   --  0.78  --  0.72 0.90  < > = values in this interval not displayed.  Estimated Creatinine Clearance: 55.1 mL/min (by C-G formula based on SCr of 0.9 mg/dL).    Assessment: 83yo with history of PE on heparin bridge while Coumadin on hold s/p sigmoidoscopy and decompression due to voluvulus.    Pending surgery  INR 2.63  Goal of Therapy:  Heparin level 0.3-0.7 units/ml Monitor platelets by anticoagulation protocol: Yes   Plan:  Pending surgery Heparin when INR < 2 Daily INR  Levester Fresh, PharmD, BCPS, BCCCP Clinical Pharmacist Clinical phone for 06/06/2017 from 7a-3:30p: [phone] If after 3:30p, please call main pharmacy at: [phone] 06/06/2017 9:56 AM

## 2017-06-06 NOTE — Progress Notes (Addendum)
   Subjective: Patient was seen siting comfortably in his chair this morning with daughter at bedside. Denies any pain. No complaints this morning. His daughter inquired about the next steps in her fathers care. She was informed that after they had decided to stay in the hospital due to apparent recurrence of volvulus on KUB yesterday, we would be repeating the KUB and reconsulting surgery as the decompression of his volvulus by GI had failed twice now during his current hospitalization. She was in agreement with the current plan.  Objective:  Vital signs in last 24 hours: Vitals:   06/05/17 0453 06/05/17 1303 06/05/17 2308 06/06/17 0651  BP: 138/86 (!) 167/99 (!) 142/80 140/82  Pulse: (!) 57 65 62 67  Resp: 16 18 17 17   Temp: 98.4 F (36.9 C) 98.5 F (36.9 C) 98.3 F (36.8 C) 98.2 F (36.8 C)  TempSrc: Oral Axillary Oral   SpO2: 98% 100% 99% 99%  Weight:      Height:       Gen. Well-developed elderly man, in no acute distress.  Abdomen.soft, non tender, mildly distended, bowel sounds positive. Chest. Clear bilaterally. CV. Regular rate and rhythm. Extremities.no edema, no cyanosis, pulses intact and symmetrical.  Assessment/Plan:  Sigmoid Volvulus. Repeat KUB prior to discharge showed evidence of recurrent volvulus. Family discussed his options yesterday and have opted for him to remain hospitalized and recieve surgical intervention since the volvulus has recurred twice this hospitalization. Potassium 4.2 on 9/16. - Advancing diet As Tolerated - Appreciate Surgery Reccomendations  History of PE  Pt on chronic warfarin for remote, recurrent PE. On presentation, INR subtherapeutic in setting of reported missed doses at home.he was placed on heparin infusion because of any surgical intervention if needed. Restarted on Coumadin 9/15. Coumadin discontinued on 9/17 in anticipation of upcoming surgical procedure. - ADDENDUM: Pharmacy to manage reversal of Coumadin with vitamin K. We will  start Heparin tomorrow.  Parkinson's Disease, Dementia  Pt with a history of advanced dementia and Parkinson's disease, low functional status and minimally interactive at baseline.  --Continue home memantine, Sinemet, Seroquel     Dispo: Anticipated discharge in approximately 2-4 day(s).   Neva Seat, MD 06/06/2017, 1:11 PM Pager: [phone]

## 2017-06-06 NOTE — Progress Notes (Signed)
Physical Therapy Treatment Patient Details Name: [name] [name] MRN: [id number] DOB: [birth year] Today's Date: 06/06/2017    History of Present Illness Mr. Benassi is an 83 y.o m with hx of htn, parkinson's disease, prior sigmoid volvulus which was corrected by flexible sigmoidoscopy x2, prostate cancer, dementia, previous pulmonary embolus on coumadin, and CAD who presented for abdominal distension and decreased appetite.  Underwent sigmoidoscopy showing recurrent sigmoid volvulus.. Pt was to D/C when volvulus reoccured and currently await sx    PT Comments    Pt lethargic on arrival and opened an eye once EOB. Pt at times speaking throughout the session but generally maintaining eyes closed. Pt required extensive assist for all mobility and daughter present and aware and agreeable to need for lift equipment at home with pt in current debilitated state. Will continue to follow to maximize mobility and function.     Follow Up Recommendations  Supervision/Assistance - 24 hour;Home health PT     Equipment Recommendations  Other (comment) (hoyer lift)    Recommendations for Other Services       Precautions / Restrictions Precautions Precautions: Fall    Mobility  Bed Mobility Overal bed mobility: Needs Assistance Bed Mobility: Supine to Sit     Supine to sit: Total assist;+2 for physical assistance     General bed mobility comments: pt with posterior lean with assist to pivot lower body to EOB and elevate trunk. Once EOB min-mod assist for anterior translation grossly 4 min  Transfers Overall transfer level: Needs assistance   Transfers: Sit to/from Stand;Stand Pivot Transfers Sit to Stand: Max assist;+2 physical assistance;From elevated surface Stand pivot transfers: Max assist;+2 physical assistance       General transfer comment: max assist to stand with bil knees blocked and use of belt as well as assist for anterior translation to achieve standing x 2 with pt  maintaining flexed posture. Max assist to control and move pelvis to recliner with drop arm down  Ambulation/Gait             General Gait Details: unable   Stairs            Wheelchair Mobility    Modified Rankin (Stroke Patients Only)       Balance Overall balance assessment: Needs assistance Sitting-balance support: Feet supported Sitting balance-Leahy Scale: Poor     Standing balance support: Bilateral upper extremity supported Standing balance-Leahy Scale: Zero                              Cognition Arousal/Alertness: Lethargic Behavior During Therapy: Flat affect Overall Cognitive Status: Impaired/Different from baseline                                 General Comments: pt stating to get in the car and fix the bike. Not communicating in regard to questions      Exercises General Exercises - Lower Extremity Long Arc Quad: [place];Both;10 reps;Seated Hip Flexion/Marching: AROM;Both;Seated;10 reps    General Comments        Pertinent Vitals/Pain Pain Assessment:  (PAINAD= 0)    Home Living                      Prior Function            PT Goals (current goals can now be found in the care plan section) Progress towards  PT goals: Not progressing toward goals - comment (very limited mobility with max +2)    Frequency    Min 3X/week      PT Plan Current plan remains appropriate    Co-evaluation              AM-PAC PT "6 Clicks" Daily Activity  Outcome Measure  Difficulty turning over in bed (including adjusting bedclothes, sheets and blankets)?: Unable Difficulty moving from lying on back to sitting on the side of the bed? : Unable Difficulty sitting down on and standing up from a chair with arms (e.g., wheelchair, bedside commode, etc,.)?: Unable Help needed moving to and from a bed to chair (including a wheelchair)?: Total Help needed walking in hospital room?: Total Help needed climbing 3-5  steps with a railing? : Total 6 Click Score: 6    End of Session Equipment Utilized During Treatment: Gait belt Activity Tolerance: Patient limited by fatigue Patient left: in chair;with chair alarm set;with call bell/phone within reach;with family/visitor present Nurse Communication: Mobility status;Need for lift equipment PT Visit Diagnosis: Other abnormalities of gait and mobility (R26.89);Other symptoms and signs involving the nervous system (R29.898);Muscle weakness (generalized) (M62.81)     Time: 4166-0630 PT Time Calculation (min) (ACUTE ONLY): 21 min  Charges:  $Therapeutic Activity: 8-22 mins                    G Codes:       Elwyn Reach, PT [phone]    Chasta Deshpande B Remona Boom 06/06/2017, 10:06 AM

## 2017-06-07 ENCOUNTER — Encounter (HOSPITAL_COMMUNITY)
Admission: EM | Disposition: A | Discharge status: Home Health | Payer: Self-pay | Source: Home / Self Care | Attending: Internal Medicine

## 2017-06-07 ENCOUNTER — Encounter (HOSPITAL_COMMUNITY): Payer: Self-pay | Admitting: *Deleted

## 2017-06-07 ENCOUNTER — Inpatient Hospital Stay (HOSPITAL_COMMUNITY): Payer: Medicare HMO | Admitting: Anesthesiology

## 2017-06-07 HISTORY — PX: FLEXIBLE SIGMOIDOSCOPY: SHX5431

## 2017-06-07 LAB — CBC
HCT: 37.8 % — ABNORMAL LOW (ref 39.0–52.0)
Hemoglobin: 12.4 g/dL — ABNORMAL LOW (ref 13.0–17.0)
MCH: 31.4 pg (ref 26.0–34.0)
MCHC: 32.8 g/dL (ref 30.0–36.0)
MCV: 95.7 fL (ref 78.0–100.0)
PLATELETS: 179 10*3/uL (ref 150–400)
RBC: 3.95 MIL/uL — AB (ref 4.22–5.81)
RDW: 15.5 % (ref 11.5–15.5)
WBC: 2.5 10*3/uL — AB (ref 4.0–10.5)

## 2017-06-07 LAB — PROTIME-INR
INR: 1.61
PROTHROMBIN TIME: 19 s — AB (ref 11.4–15.2)

## 2017-06-07 LAB — HEPARIN LEVEL (UNFRACTIONATED): Heparin Unfractionated: 0.8 IU/mL — ABNORMAL HIGH (ref 0.30–0.70)

## 2017-06-07 SURGERY — SIGMOIDOSCOPY, FLEXIBLE
Anesthesia: Monitor Anesthesia Care

## 2017-06-07 MED ORDER — PROPOFOL 10 MG/ML IV BOLUS
INTRAVENOUS | Status: DC | PRN
  Administered 2017-06-07: 10 mg via INTRAVENOUS
  Administered 2017-06-07 (×2): 20 mg via INTRAVENOUS

## 2017-06-07 MED ORDER — HEPARIN (PORCINE) IN NACL 100-0.45 UNIT/ML-% IJ SOLN
1150.0000 [IU]/h | INTRAMUSCULAR | Status: DC
  Administered 2017-06-07: 1150 [IU]/h via INTRAVENOUS
  Filled 2017-06-07: qty 250

## 2017-06-07 MED ORDER — PEG 3350-KCL-NA BICARB-NACL 420 G PO SOLR
2000.0000 mL | Freq: Once | ORAL | Status: AC
  Administered 2017-06-07: 2000 mL via ORAL
  Filled 2017-06-07: qty 4000

## 2017-06-07 MED ORDER — SODIUM CHLORIDE 0.9 % IV SOLN: INTRAVENOUS | Status: DC

## 2017-06-07 MED ORDER — HEPARIN (PORCINE) IN NACL 100-0.45 UNIT/ML-% IJ SOLN
800.0000 [IU]/h | INTRAMUSCULAR | Status: DC
  Administered 2017-06-07: 1050 [IU]/h via INTRAVENOUS
  Administered 2017-06-08: 800 [IU]/h via INTRAVENOUS
  Filled 2017-06-07 (×2): qty 250

## 2017-06-07 MED ORDER — SODIUM CHLORIDE 0.9 % IV SOLN
INTRAVENOUS | Status: DC | PRN
  Administered 2017-06-07: 09:00:00 via INTRAVENOUS

## 2017-06-07 MED ORDER — LACTATED RINGERS IV SOLN
INTRAVENOUS | Status: DC
  Administered 2017-06-07 – 2017-06-10 (×8): via INTRAVENOUS

## 2017-06-07 MED ORDER — FLEET ENEMA 7-19 GM/118ML RE ENEM
1.0000 | ENEMA | Freq: Three times a day (TID) | RECTAL | Status: AC
  Administered 2017-06-07: 1 via RECTAL
  Filled 2017-06-07 (×2): qty 1

## 2017-06-07 MED ORDER — LACTATED RINGERS IV SOLN: INTRAVENOUS | Status: DC

## 2017-06-07 NOTE — Progress Notes (Addendum)
[facility] Gastroenterology Progress Note  [name] [name] 83 y.o. 31-Jul-1931  CC:  Recurrent sigmoid volvulus   Subjective:  No acute issues since yesterday. INR is down to 1.6. he denies any new complaints today.  ROS : negative for chest pain shortness of breath   Objective: Vital signs in last 24 hours: Vitals:   06/07/17 0503 06/07/17 0910  BP: (!) 159/98 (!) 184/81  Pulse: (!) 55 61  Resp: 17 15  Temp: 97.6 F (36.4 C) 98.6 F (37 C)  SpO2: 96% 99%    Physical Exam:  General:  Alert, cooperative, no distress, appears stated age     Eyes:  , EOM's intact,   Lungs:   Clear to auscultation bilaterally, respirations unlabored  Heart:  Regular rate and rhythm, S1, S2 normal  Abdomen:   Mild distended. Mild discomfort without any significant tenderness to palpation, bowel sounds present. No peritoneal signs.  Extremities: Extremities normal, atraumatic, no  edema       Lab Results:  Recent Labs  06/05/17 0437 06/06/17 0303  NA 138 138  K 4.2 3.7  CL 111 109  CO2 23 24  GLUCOSE 90 105*  BUN <5* <5*  CREATININE 0.72 0.90  CALCIUM 8.4* 8.5*  MG 1.6*  --    No results for input(s): AST, ALT, ALKPHOS, BILITOT, PROT, ALBUMIN in the last 72 hours.  Recent Labs  06/06/17 0303 06/07/17 0657  WBC 2.5* 2.5*  HGB 11.4* 12.4*  HCT 35.3* 37.8*  MCV 95.4 95.7  PLT 161 179    Recent Labs  06/06/17 0303 06/07/17 0505  LABPROT 27.9* 19.0*  INR 2.63 1.61      Assessment/Plan: - recurrent sigmoid volvulus.status post decompression twice during this admission. - history of pulmonary embolism.Coumadin on hold. INR 1.61 today. - Parkinson's disease with dementia.  Recommendation ------------------------ - Flexible sigmoidoscopy for possible decompression today. Risk benefits and alternatives Discussed with the daughter.   Otis Brace MD, [place] 06/07/2017, 9:42 AM  Pager [phone]  If no answer or after 5 PM call [phone]

## 2017-06-07 NOTE — Op Note (Signed)
[facility] Patient Name: [name] [name] Procedure Date : 06/07/2017 MRN: [id number] Attending MD: Otis Brace , MD Date of Birth: December 12, 1930 CSN: [id number] Age: 83 Admit Type: Inpatient Procedure:                Flexible Sigmoidoscopy Indications:              Volvulus, For therapy of volvulus Providers:                Otis Brace, MD, Debara Pickett., Technician, Rejeana Brock, CRNA Referring MD:              Medicines:                Sedation Administered by an Anesthesia Professional Complications:            No immediate complications. Estimated Blood Loss:     Estimated blood loss: none. Procedure:                Pre-Anesthesia Assessment:                           - Prior to the procedure, a History and Physical                            was performed, and patient medications and                            allergies were reviewed. The patient's tolerance of                            previous anesthesia was also reviewed. The risks                            and benefits of the procedure and the sedation                            options and risks were discussed with the patient.                            All questions were answered, and informed consent                            was obtained. Prior Anticoagulants: The patient has                            taken Coumadin (warfarin), last dose was 2 days                            prior to procedure. ASA Grade Assessment: [name] - A                            patient with severe systemic disease. After  reviewing the risks and benefits, the patient was                            deemed in satisfactory condition to undergo the                            procedure.                           After obtaining informed consent, the scope was                            passed under direct vision. The [id number] ([id number])           scope was introduced through the anus and advanced                            to the 60 cm from the anal verge. The flexible                            sigmoidoscopy was accomplished without difficulty.                            The patient tolerated the procedure well. The                            quality of the bowel preparation was fair. Findings:      The perianal and digital rectal examinations were normal.      A small amount of semi-solid stool was found in the rectum and in the       recto-sigmoid colon. mucosa of sigmoid colon and descending colon       appeared normal      A few diverticula were found in the sigmoid colon and descending colon.       no evidence of sigmoid volvulus identified on today's exam.      The lumen of the descending colon was moderately dilated. Impression:               - Preparation of the colon was fair.                           - Stool in the rectum and in the recto-sigmoid                            colon.                           - Diverticulosis in the sigmoid colon and in the                            descending colon.                           - Dilated in the descending colon.                           -  No specimens collected. Recommendation:           - Return patient to hospital ward for ongoing care. Procedure Code(s):        --- Professional ---                           [id number], Sigmoidoscopy, flexible; diagnostic,                            including collection of specimen(s) by brushing or                            washing, when performed (separate procedure) Diagnosis Code(s):        --- Professional ---                           K64.8, Other hemorrhoids                           K59.39, Other megacolon                           K56.2, Volvulus                           K57.30, Diverticulosis of large intestine without                            perforation or abscess without bleeding CPT copyright 2016 American Medical  Association. All rights reserved. The codes documented in this report are preliminary and upon coder review may  be revised to meet current compliance requirements. Otis Brace, MD Otis Brace, MD 06/07/2017 10:17:52 AM Number of Addenda: 0

## 2017-06-07 NOTE — Progress Notes (Signed)
Patient ID: [name] [name], male   DOB: 1931-07-15, 83 y.o.   MRN: [id number] Appreciate Dr. Barbera Setters help and I spoke with him on the phone. I called his daughter and discussed the sigmoidoscopy findings. I discussed the known risk of recurrence of this sigmoid volvulus. She would like [name] to proceed with surgery. We will plan a bowel prep and will proceed with sigmoid colectomy, possible colostomy tomorrow. I discussed the procedure, risks, and benefits with his daughter and she agrees. She will sign the consent when she comes by.  Georganna Skeans, MD, MPH, FACS Trauma: [phone] General Surgery: [phone]

## 2017-06-07 NOTE — Progress Notes (Signed)
ANTICOAGULATION CONSULT NOTE- follow up  Pharmacy Consult for heparin Indication: history of PE  Allergies  Allergen Reactions  . Aricept [Donepezil Hcl] Other (See Comments)    Symptomatic Bradycardia.  . Other Other (See Comments)    Shrimp gives him gout  . Shellfish-Derived Products Other (See Comments)    Causes a flare up with Gout    Patient Measurements: Height: 5\' 6"  (167.6 cm) Weight: 149 lb (67.6 kg) IBW/kg (Calculated) : 63.8  Heparin dosing wt: 67.6 kg   Vital Signs: Temp: 98.1 F (36.7 C) (09/18 2051) Temp Source: Oral (09/18 2051) BP: 154/95 (09/18 2051) Pulse Rate: 67 (09/18 2051)  Labs:  Recent Labs  06/05/17 0437 06/06/17 0303 06/07/17 0505 06/07/17 0657 06/07/17 2157  HGB 11.6* 11.4*  --  12.4*  --   HCT 36.4* 35.3*  --  37.8*  --   PLT 163 161  --  179  --   LABPROT 27.5* 27.9* 19.0*  --   --   INR 2.59 2.63 1.61  --   --   HEPARINUNFRC 0.57  --   --   --  0.80*  CREATININE 0.72 0.90  --   --   --     Estimated Creatinine Clearance: 55.1 mL/min (by C-G formula based on SCr of 0.9 mg/dL).    Assessment: 83yo with history of PE on heparin bridge while Coumadin on hold s/p sigmoidoscopy and decompression due to voluvulus.    Pending surgery (colectomy) later in week  8 hour heparin level = 0.80 on IV heparin drip rate 1150 units/hr Heparin drip infusing without complications , no bleeding noted per RN. Level should have been drawn from site opposite arm of heparin site per RN- she will contact pharmacy if found otherwise..    Goal of Therapy:  Heparin level 0.3-0.7 units/ml Monitor platelets by anticoagulation protocol: Yes   Plan:  Decrease Heparin rate to 1050 units/hr Check Daily HL, CBC Pending surgery - colectomy later in week  Nicole Cella, RPh Clinical Pharmacist If after 3:30p, please call main pharmacy at: [phone] 06/07/2017 10:53 PM

## 2017-06-07 NOTE — Progress Notes (Signed)
   Subjective: Patient was seen laying comfortably in his bed this morning. He is pleasantly demented and denies any pain. No complaints this morning. He received vitamin K yesterday to bring his INR down prior to upcoming procedures. He underwent decompression of volvulus today in preparation to surgery, which went well.  Objective:  Vital signs in last 24 hours: Vitals:   06/06/17 0651 06/06/17 1456 06/06/17 2129 06/07/17 0503  BP: 140/82 (!) 146/79 (!) 157/82 (!) 159/98  Pulse: 67 70 68 (!) 55  Resp: 17 18 18 17   Temp: 98.2 F (36.8 C) 97.6 F (36.4 C) 97.8 F (36.6 C) 97.6 F (36.4 C)  TempSrc:  Axillary Oral Oral  SpO2: 99% 99% 98% 96%  Weight:      Height:       Gen. Well-developed elderly man, in no acute distress.  Abdomen. non tender, mildly distended, mildly tight, bowel sounds positive. Chest. Clear bilaterally. CV. Regular rate and rhythm. Extremities.no edema, no cyanosis, pulses intact and symmetrical.  Assessment/Plan:  Sigmoid Volvulus. Repeat KUB prior to discharge showed evidence of recurrent volvulus. Family opted to remain in hospital and pusue surgical intervention. Potassium 4.2 on 9/16. - Appreciate Surgery Reccomendations - Successful Volvulus decompression today in preparation for surgical intervention - Colon Prep per surgery   History of PE  Pt on chronic warfarin for remote, recurrent PE. On presentation, INR subtherapeutic in setting of reported missed doses at home.he was placed on heparin infusion because of any surgical intervention if needed. Restarted on Coumadin 9/15. Coumadin discontinued on 9/17 and Vitamin K administered in anticipation of upcoming surgical procedure. - Heparin per pharmacy  Parkinson's Disease, Dementia  Pt with a history of advanced dementia and Parkinson's disease, low functional status and minimally interactive at baseline.  --Continue home memantine, Sinemet, Seroquel     Dispo: Anticipated discharge in  approximately 2-4 day(s).   Neva Seat, MD 06/07/2017, 8:53 AM Pager: [phone]

## 2017-06-07 NOTE — Progress Notes (Signed)
  Date: 06/07/2017  Patient name: [name] [name]  Medical record number: [id number]  Date of birth: Dec 20, 1930   I have seen and evaluated this patient and I have discussed the plan of care with the house staff. Please see Dr. Tally Joe note for complete details. I concur with his findings.  Sid Falcon, MD 06/07/2017, 3:15 PM

## 2017-06-07 NOTE — Progress Notes (Signed)
[place] for heparin Indication: history of PE  Allergies  Allergen Reactions  . Aricept [Donepezil Hcl] Other (See Comments)    Symptomatic Bradycardia.  . Other Other (See Comments)    Shrimp gives him gout  . Shellfish-Derived Products Other (See Comments)    Causes a flare up with Gout    Patient Measurements: Height: 5\' 6"  (167.6 cm) Weight: 149 lb (67.6 kg) IBW/kg (Calculated) : 63.8   Vital Signs: Temp: 97.7 F (36.5 C) (09/18 1020) Temp Source: Axillary (09/18 1020) BP: 169/78 (09/18 1030) Pulse Rate: 54 (09/18 1030)  Labs:  Recent Labs  06/05/17 0437 06/06/17 0303 06/07/17 0505 06/07/17 0657  HGB 11.6* 11.4*  --  12.4*  HCT 36.4* 35.3*  --  37.8*  PLT 163 161  --  179  LABPROT 27.5* 27.9* 19.0*  --   INR 2.59 2.63 1.61  --   HEPARINUNFRC 0.57  --   --   --   CREATININE 0.72 0.90  --   --     Estimated Creatinine Clearance: 55.1 mL/min (by C-G formula based on SCr of 0.9 mg/dL).    Assessment: 83yo with history of PE on heparin bridge while Coumadin on hold s/p sigmoidoscopy and decompression due to voluvulus.    Pending surgery later in week  INR 1.61  Goal of Therapy:  Heparin level 0.3-0.7 units/ml Monitor platelets by anticoagulation protocol: Yes   Plan:  Pending surgery - colectomy later in week Heparin 1150 units/hr 2200 HL Daily HL, CBC  Levester Fresh, PharmD, BCPS, BCCCP Clinical Pharmacist Clinical phone for 06/07/2017 from 7a-3:30p: [phone] If after 3:30p, please call main pharmacy at: [phone] 06/07/2017 1:08 PM

## 2017-06-07 NOTE — Transfer of Care (Signed)
Immediate Anesthesia Transfer of Care Note  [name]  Procedure(s) Performed: Procedure(s): FLEXIBLE SIGMOIDOSCOPY for possible decompression (N/A)  Patient Location: PACU  Anesthesia Type:MAC  Level of Consciousness: awake  Airway & Oxygen Therapy: Patient Spontanous Breathing  Post-op Assessment: Report given to RN and Post -op Vital signs reviewed and stable  Post vital signs: Reviewed and stable  Last Vitals:  Vitals:   06/07/17 0503 06/07/17 0910  BP: (!) 159/98 (!) 184/81  Pulse: (!) 55 61  Resp: 17 15  Temp: 36.4 C 37 C  SpO2: 96% 99%    Last Pain:  Vitals:   06/07/17 0910  TempSrc: Oral  PainSc:          Complications: No apparent anesthesia complications

## 2017-06-07 NOTE — Anesthesia Preprocedure Evaluation (Addendum)
Anesthesia Evaluation  Patient identified by MRN, date of birth, ID band Patient awake and Patient confused    Reviewed: Allergy & Precautions, NPO status , Patient's Chart, lab work & pertinent test results  History of Anesthesia Complications Negative for: history of anesthetic complications  Airway Mallampati: II  TM Distance: >3 FB Neck ROM: Full    Dental  (+) Poor Dentition, Missing, Dental Advisory Given   Pulmonary PE (chronic coumadin)   breath sounds clear to auscultation       Cardiovascular hypertension, Pt. on medications (-) angina+ CAD (non-obstructive by cath '16, EF 50-55%)   Rhythm:Regular Rate:Bradycardia  '16 ECHO: EF 40-45%, valves OK   Neuro/Psych Parkinson's H/o pituitary microadenoma    GI/Hepatic Neg liver ROS, GERD  Medicated and Controlled,H/o volvulus   Endo/Other  negative endocrine ROS  Renal/GU negative Renal ROS     Musculoskeletal  (+) Arthritis ,   Abdominal   Peds  Hematology Coumadin:  INR 1.61   Anesthesia Other Findings   Reproductive/Obstetrics                            Anesthesia Physical Anesthesia Plan  ASA: [name]  Anesthesia Plan: MAC   Post-op Pain Management:    Induction: Intravenous  PONV Risk Score and Plan: 1 and Ondansetron and Treatment may vary due to age or medical condition  Airway Management Planned: Natural Airway and Nasal Cannula  Additional Equipment:   Intra-op Plan:   Post-operative Plan:   Informed Consent: I have reviewed the patients History and Physical, chart, labs and discussed the procedure including the risks, benefits and alternatives for the proposed anesthesia with the patient or authorized representative who has indicated his/her understanding and acceptance.   Dental advisory given and Consent reviewed with POA  Plan Discussed with: Surgeon and CRNA  Anesthesia Plan Comments: (Plan routine  monitors, MAC Discussed with pt and daughter)        Anesthesia Quick Evaluation

## 2017-06-07 NOTE — Brief Op Note (Addendum)
05/30/2017 - 06/07/2017  10:19 AM  PATIENT:  [name] [name]  83 y.o. male  PRE-OPERATIVE DIAGNOSIS:  Sigmoid volvulus  POST-OPERATIVE DIAGNOSIS:  diverticuli  PROCEDURE:  Procedure(s): FLEXIBLE SIGMOIDOSCOPY for possible decompression (N/A)  SURGEON:  Surgeon(s) and Role:    * Sharon Rubis, MD - Primary  Findings/recommendations ------------------------------------- - No evidence of volvulus on today's exam. - Okay to have clear liquid diet from GI standpoint. - Small amount of semisolid stool in the rectum and rectosigmoid area - Sigmoid colon and descending colon appeared normal. His colon proximal to the rectosigmoid area had a good prep. He may not need full prep for his upcoming procedure. Consider Fleet enema to remove remaining stool from rectum. - Okay to proceed with surgery from GI standpoint. - GI will sign off. Call us back if needed. Findings were discussed with the daughter   Otis Brace MD, [place] 06/07/2017, 10:21 AM  Pager [phone]  If no answer or after 5 PM call [phone]

## 2017-06-07 NOTE — Anesthesia Postprocedure Evaluation (Signed)
Anesthesia Post Note  [name]  Procedure(s) Performed: Procedure(s) (LRB): FLEXIBLE SIGMOIDOSCOPY for possible decompression (N/A)     Patient location during evaluation: Endoscopy Anesthesia Type: MAC Level of consciousness: awake and alert and patient cooperative Pain management: pain level controlled Vital Signs Assessment: post-procedure vital signs reviewed and stable Respiratory status: spontaneous breathing, nonlabored ventilation, respiratory function stable and patient connected to nasal cannula oxygen Cardiovascular status: blood pressure returned to baseline and stable Postop Assessment: no apparent nausea or vomiting Anesthetic complications: no    Last Vitals:  Vitals:   06/07/17 0910 06/07/17 1020  BP: (!) 184/81 138/71  Pulse: 61 (!) 55  Resp: 15 12  Temp: 37 C 36.5 C  SpO2: 99% 97%    Last Pain:  Vitals:   06/07/17 1020  TempSrc: Axillary  PainSc:                  Elveria Lauderbaugh,E. Donie Moulton

## 2017-06-08 ENCOUNTER — Encounter (HOSPITAL_COMMUNITY): Payer: Self-pay | Admitting: Gastroenterology

## 2017-06-08 ENCOUNTER — Inpatient Hospital Stay (HOSPITAL_COMMUNITY): Payer: Medicare HMO | Admitting: Certified Registered"

## 2017-06-08 ENCOUNTER — Encounter (HOSPITAL_COMMUNITY)
Admission: EM | Disposition: A | Discharge status: Home Health | Payer: Self-pay | Source: Home / Self Care | Attending: Internal Medicine

## 2017-06-08 HISTORY — PX: PARTIAL COLECTOMY: SHX5273

## 2017-06-08 LAB — BASIC METABOLIC PANEL
ANION GAP: 8 (ref 5–15)
BUN: <5 mg/dL — ABNORMAL LOW (ref 6–20)
CHLORIDE: 106 mmol/L (ref 101–111)
CO2: 25 mmol/L (ref 22–32)
Calcium: 8.7 mg/dL — ABNORMAL LOW (ref 8.9–10.3)
Creatinine, Ser: 0.81 mg/dL (ref 0.61–1.24)
GFR calc Af Amer: >60 mL/min (ref >60)
GLUCOSE: 68 mg/dL (ref 65–99)
POTASSIUM: 3.3 mmol/L — AB (ref 3.5–5.1)
Sodium: 139 mmol/L (ref 135–145)

## 2017-06-08 LAB — CBC
HCT: 36.9 % — ABNORMAL LOW (ref 39.0–52.0)
HEMOGLOBIN: 12.1 g/dL — AB (ref 13.0–17.0)
MCH: 31.1 pg (ref 26.0–34.0)
MCHC: 32.8 g/dL (ref 30.0–36.0)
MCV: 94.9 fL (ref 78.0–100.0)
Platelets: 164 10*3/uL (ref 150–400)
RBC: 3.89 MIL/uL — AB (ref 4.22–5.81)
RDW: 15 % (ref 11.5–15.5)
WBC: 3.9 10*3/uL — AB (ref 4.0–10.5)

## 2017-06-08 LAB — PROTIME-INR
INR: 1.33
Prothrombin Time: 16.4 seconds — ABNORMAL HIGH (ref 11.4–15.2)

## 2017-06-08 LAB — HEPARIN LEVEL (UNFRACTIONATED)
HEPARIN UNFRACTIONATED: 0.9 [IU]/mL — AB (ref 0.30–0.70)
HEPARIN UNFRACTIONATED: 0.26 [IU]/mL — AB (ref 0.30–0.70)

## 2017-06-08 LAB — SURGICAL PCR SCREEN
MRSA, PCR: NEGATIVE
Staphylococcus aureus: NEGATIVE

## 2017-06-08 SURGERY — COLECTOMY, PARTIAL
Anesthesia: General | Site: Abdomen

## 2017-06-08 MED ORDER — FENTANYL CITRATE (PF) 100 MCG/2ML IJ SOLN
25.0000 ug | INTRAMUSCULAR | Status: DC | PRN
  Administered 2017-06-08: 50 ug via INTRAVENOUS

## 2017-06-08 MED ORDER — LIDOCAINE 2% (20 MG/ML) 5 ML SYRINGE
INTRAMUSCULAR | Status: AC
  Filled 2017-06-08: qty 5

## 2017-06-08 MED ORDER — FENTANYL CITRATE (PF) 100 MCG/2ML IJ SOLN
INTRAMUSCULAR | Status: DC | PRN
  Administered 2017-06-08: 25 ug via INTRAVENOUS
  Administered 2017-06-08: 75 ug via INTRAVENOUS
  Administered 2017-06-08: 50 ug via INTRAVENOUS

## 2017-06-08 MED ORDER — LABETALOL HCL 5 MG/ML IV SOLN
INTRAVENOUS | Status: AC
  Filled 2017-06-08: qty 4

## 2017-06-08 MED ORDER — ONDANSETRON HCL 4 MG/2ML IJ SOLN
INTRAMUSCULAR | Status: AC
  Filled 2017-06-08: qty 2

## 2017-06-08 MED ORDER — HYDRALAZINE HCL 20 MG/ML IJ SOLN
INTRAMUSCULAR | Status: DC | PRN
  Administered 2017-06-08 (×2): 5 mg via INTRAVENOUS

## 2017-06-08 MED ORDER — PHENYLEPHRINE 40 MCG/ML (10ML) SYRINGE FOR IV PUSH (FOR BLOOD PRESSURE SUPPORT)
PREFILLED_SYRINGE | INTRAVENOUS | Status: DC | PRN
  Administered 2017-06-08 (×2): 80 ug via INTRAVENOUS

## 2017-06-08 MED ORDER — 0.9 % SODIUM CHLORIDE (POUR BTL) OPTIME
TOPICAL | Status: DC | PRN
  Administered 2017-06-08 (×2): 1000 mL

## 2017-06-08 MED ORDER — FENTANYL CITRATE (PF) 250 MCG/5ML IJ SOLN
INTRAMUSCULAR | Status: AC
  Filled 2017-06-08: qty 5

## 2017-06-08 MED ORDER — DEXTROSE 5 % IV SOLN
2.0000 g | INTRAVENOUS | Status: AC
  Administered 2017-06-08: 2 g via INTRAVENOUS
  Filled 2017-06-08: qty 2

## 2017-06-08 MED ORDER — LIDOCAINE 2% (20 MG/ML) 5 ML SYRINGE
INTRAMUSCULAR | Status: DC | PRN
  Administered 2017-06-08: 30 mg via INTRAVENOUS

## 2017-06-08 MED ORDER — MORPHINE SULFATE (PF) 4 MG/ML IV SOLN
2.0000 mg | INTRAVENOUS | Status: DC | PRN
  Administered 2017-06-08: 2 mg via INTRAVENOUS
  Administered 2017-06-09: 4 mg via INTRAVENOUS
  Filled 2017-06-08 (×2): qty 1

## 2017-06-08 MED ORDER — LABETALOL HCL 5 MG/ML IV SOLN
INTRAVENOUS | Status: DC | PRN
  Administered 2017-06-08: 5 mg via INTRAVENOUS

## 2017-06-08 MED ORDER — ONDANSETRON HCL 4 MG/2ML IJ SOLN: 4.0000 mg | Freq: Once | INTRAMUSCULAR | Status: DC | PRN

## 2017-06-08 MED ORDER — ONDANSETRON HCL 4 MG/2ML IJ SOLN
INTRAMUSCULAR | Status: DC | PRN
  Administered 2017-06-08: 4 mg via INTRAVENOUS

## 2017-06-08 MED ORDER — SUGAMMADEX SODIUM 200 MG/2ML IV SOLN
INTRAVENOUS | Status: DC | PRN
  Administered 2017-06-08: 200 mg via INTRAVENOUS

## 2017-06-08 MED ORDER — DEXAMETHASONE SODIUM PHOSPHATE 10 MG/ML IJ SOLN
INTRAMUSCULAR | Status: DC | PRN
  Administered 2017-06-08: 4 mg via INTRAVENOUS

## 2017-06-08 MED ORDER — HEPARIN (PORCINE) IN NACL 100-0.45 UNIT/ML-% IJ SOLN
1000.0000 [IU]/h | INTRAMUSCULAR | Status: DC
  Administered 2017-06-09: 800 [IU]/h via INTRAVENOUS
  Administered 2017-06-10: 1000 [IU]/h via INTRAVENOUS
  Filled 2017-06-08 (×3): qty 250

## 2017-06-08 MED ORDER — SUCCINYLCHOLINE CHLORIDE 200 MG/10ML IV SOSY
PREFILLED_SYRINGE | INTRAVENOUS | Status: AC
  Filled 2017-06-08: qty 10

## 2017-06-08 MED ORDER — SUGAMMADEX SODIUM 200 MG/2ML IV SOLN
INTRAVENOUS | Status: AC
  Filled 2017-06-08: qty 2

## 2017-06-08 MED ORDER — SUCCINYLCHOLINE CHLORIDE 200 MG/10ML IV SOSY
PREFILLED_SYRINGE | INTRAVENOUS | Status: DC | PRN
  Administered 2017-06-08: 140 mg via INTRAVENOUS

## 2017-06-08 MED ORDER — PROPOFOL 10 MG/ML IV BOLUS
INTRAVENOUS | Status: AC
  Filled 2017-06-08: qty 20

## 2017-06-08 MED ORDER — PHENYLEPHRINE 40 MCG/ML (10ML) SYRINGE FOR IV PUSH (FOR BLOOD PRESSURE SUPPORT)
PREFILLED_SYRINGE | INTRAVENOUS | Status: AC
  Filled 2017-06-08: qty 10

## 2017-06-08 MED ORDER — FENTANYL CITRATE (PF) 100 MCG/2ML IJ SOLN
INTRAMUSCULAR | Status: AC
  Administered 2017-06-08: 50 ug via INTRAVENOUS
  Filled 2017-06-08: qty 2

## 2017-06-08 MED ORDER — LACTATED RINGERS IV SOLN
INTRAVENOUS | Status: DC | PRN
  Administered 2017-06-08: 13:00:00 via INTRAVENOUS

## 2017-06-08 MED ORDER — HYDRALAZINE HCL 20 MG/ML IJ SOLN
INTRAMUSCULAR | Status: AC
  Filled 2017-06-08: qty 1

## 2017-06-08 MED ORDER — ROCURONIUM BROMIDE 100 MG/10ML IV SOLN
INTRAVENOUS | Status: DC | PRN
  Administered 2017-06-08: 20 mg via INTRAVENOUS
  Administered 2017-06-08: 40 mg via INTRAVENOUS

## 2017-06-08 MED ORDER — PROPOFOL 10 MG/ML IV BOLUS
INTRAVENOUS | Status: DC | PRN
  Administered 2017-06-08: 130 mg via INTRAVENOUS

## 2017-06-08 MED ORDER — EPHEDRINE 5 MG/ML INJ
INTRAVENOUS | Status: AC
  Filled 2017-06-08: qty 10

## 2017-06-08 MED ORDER — POTASSIUM CHLORIDE 10 MEQ/100ML IV SOLN
10.0000 meq | INTRAVENOUS | Status: AC
  Administered 2017-06-08 (×2): 10 meq via INTRAVENOUS
  Filled 2017-06-08 (×2): qty 100

## 2017-06-08 SURGICAL SUPPLY — 52 items
BLADE CLIPPER SURG (BLADE) IMPLANT
CANISTER SUCT 3000ML PPV (MISCELLANEOUS) ×3 IMPLANT
CHLORAPREP W/TINT 26ML (MISCELLANEOUS) ×3 IMPLANT
COVER MAYO STAND STRL (DRAPES) ×6 IMPLANT
COVER SURGICAL LIGHT HANDLE (MISCELLANEOUS) ×3 IMPLANT
DRAPE HALF SHEET 40X57 (DRAPES) ×6 IMPLANT
DRAPE LAPAROSCOPIC ABDOMINAL (DRAPES) ×3 IMPLANT
DRAPE UTILITY XL STRL (DRAPES) ×15 IMPLANT
DRAPE WARM FLUID 44X44 (DRAPE) ×3 IMPLANT
DRSG OPSITE POSTOP 4X10 (GAUZE/BANDAGES/DRESSINGS) IMPLANT
DRSG OPSITE POSTOP 4X8 (GAUZE/BANDAGES/DRESSINGS) IMPLANT
ELECT BLADE 6.5 EXT (BLADE) ×3 IMPLANT
ELECT CAUTERY BLADE 6.4 (BLADE) ×6 IMPLANT
ELECT REM PT RETURN 9FT ADLT (ELECTROSURGICAL) ×3
ELECTRODE REM PT RTRN 9FT ADLT (ELECTROSURGICAL) ×1 IMPLANT
GLOVE BIO SURGEON STRL SZ8 (GLOVE) ×6 IMPLANT
GLOVE BIOGEL PI IND STRL 8 (GLOVE) ×2 IMPLANT
GLOVE BIOGEL PI INDICATOR 8 (GLOVE) ×4
GOWN STRL REUS W/ TWL LRG LVL3 (GOWN DISPOSABLE) ×4 IMPLANT
GOWN STRL REUS W/ TWL XL LVL3 (GOWN DISPOSABLE) ×2 IMPLANT
GOWN STRL REUS W/TWL LRG LVL3 (GOWN DISPOSABLE) ×12
GOWN STRL REUS W/TWL XL LVL3 (GOWN DISPOSABLE) ×6
KIT BASIN OR (CUSTOM PROCEDURE TRAY) ×3 IMPLANT
KIT ROOM TURNOVER OR (KITS) ×3 IMPLANT
LEGGING LITHOTOMY PAIR STRL (DRAPES) ×3 IMPLANT
LIGASURE IMPACT 36 18CM CVD LR (INSTRUMENTS) ×3 IMPLANT
NS IRRIG 1000ML POUR BTL (IV SOLUTION) ×6 IMPLANT
PACK GENERAL/GYN (CUSTOM PROCEDURE TRAY) ×3 IMPLANT
PAD ARMBOARD 7.5X6 YLW CONV (MISCELLANEOUS) ×6 IMPLANT
PENCIL BUTTON HOLSTER BLD 10FT (ELECTRODE) ×3 IMPLANT
SPECIMEN JAR X LARGE (MISCELLANEOUS) ×3 IMPLANT
SPONGE LAP 18X18 X RAY DECT (DISPOSABLE) IMPLANT
STAPLER VISISTAT 35W (STAPLE) ×3 IMPLANT
SUCTION POOLE TIP (SUCTIONS) ×3 IMPLANT
SURGILUBE 2OZ TUBE FLIPTOP (MISCELLANEOUS) IMPLANT
SUT PDS AB 1 TP1 96 (SUTURE) ×6 IMPLANT
SUT PROLENE 2 0 CT2 30 (SUTURE) IMPLANT
SUT PROLENE 2 0 KS (SUTURE) IMPLANT
SUT SILK 2 0 SH CR/8 (SUTURE) ×3 IMPLANT
SUT SILK 2 0 TIES 10X30 (SUTURE) ×3 IMPLANT
SUT SILK 3 0 SH CR/8 (SUTURE) ×3 IMPLANT
SUT SILK 3 0 TIES 10X30 (SUTURE) ×3 IMPLANT
SUT VIC AB 3-0 SH 18 (SUTURE) IMPLANT
SYR BULB IRRIGATION 50ML (SYRINGE) ×3 IMPLANT
TOWEL OR 17X26 10 PK STRL BLUE (TOWEL DISPOSABLE) ×6 IMPLANT
TRAY FOLEY W/METER SILVER 14FR (SET/KITS/TRAYS/PACK) ×3 IMPLANT
TRAY PROCTOSCOPIC FIBER OPTIC (SET/KITS/TRAYS/PACK) IMPLANT
TUBE CONNECTING 12'X1/4 (SUCTIONS) ×1
TUBE CONNECTING 12X1/4 (SUCTIONS) ×2 IMPLANT
UNDERPAD 30X30 (UNDERPADS AND DIAPERS) IMPLANT
WATER STERILE IRR 1000ML POUR (IV SOLUTION) ×3 IMPLANT
YANKAUER SUCT BULB TIP NO VENT (SUCTIONS) ×3 IMPLANT

## 2017-06-08 NOTE — Transfer of Care (Signed)
Immediate Anesthesia Transfer of Care Note  [name]  Procedure(s) Performed: Procedure(s): SIGMOID  COLECTOMY (N/A)  Patient Location: PACU  Anesthesia Type:General  Level of Consciousness: awake, alert , oriented and patient cooperative  Airway & Oxygen Therapy: Patient Spontanous Breathing and Patient connected to nasal cannula oxygen  Post-op Assessment: Report given to RN and Post -op Vital signs reviewed and stable  Post vital signs: Reviewed and stable  Last Vitals:  Vitals:   06/07/17 2051 06/08/17 0641  BP: (!) 154/95 (!) 123/98  Pulse: 67 67  Resp:  17  Temp: 36.7 C 36.8 C  SpO2: 99% 100%    Last Pain:  Vitals:   06/08/17 0641  TempSrc: Oral  PainSc:       Patients Stated Pain Goal: 0 (47/09/62 8366)  Complications: No apparent anesthesia complications

## 2017-06-08 NOTE — Anesthesia Postprocedure Evaluation (Signed)
Anesthesia Post Note  [name]  Procedure(s) Performed: Procedure(s) (LRB): SIGMOID  COLECTOMY (N/A)     Anesthesia Post Evaluation  Last Vitals:  Vitals:   06/08/17 1535 06/08/17 1550  BP: (!) 180/103 (!) 176/106  Pulse: 75 75  Resp: 11 14  Temp: (!) 36.4 C   SpO2: 97% 98%    Last Pain:  Vitals:   06/08/17 0641  TempSrc: Oral  PainSc:                  Fritzi Scripter

## 2017-06-08 NOTE — Progress Notes (Signed)
   Subjective: Patient was seen laying comfortably in his bed this morning. He is pleasantly demented. He endorse some chronic back pain this morning. He also endorsed some abdominal pain. No other complaints this morning. He is scheduled for surgery today.   Objective:  Vital signs in last 24 hours: Vitals:   06/07/17 1030 06/07/17 1300 06/07/17 2051 06/08/17 0641  BP: (!) 169/78 (!) 144/86 (!) 154/95 (!) 123/98  Pulse: (!) 54 62 67 67  Resp: 11   17  Temp:  97.8 F (36.6 C) 98.1 F (36.7 C) 98.3 F (36.8 C)  TempSrc:  Axillary Oral Oral  SpO2: 97% 98% 99% 100%  Weight:      Height:       Gen. Well-developed elderly man, in no acute distress.  Abdomen. non tender, mildly distended, mildly tight, bowel sounds positive. Chest. Clear bilaterally. CV. Regular rate and rhythm. Extremities.no edema, no cyanosis, pulses intact and symmetrical.  Assessment/Plan:  Sigmoid Volvulus. Repeat KUB prior to discharge showed evidence of recurrent volvulus. Family opted to remain in hospital and pusue surgical intervention. Potassium 4.2 on 9/16. - Appreciate Surgery Reccomendations - Successful Volvulus decompression today in preparation for surgical intervention - Colon Prep per surgery   Hypokalemia: K 3.3 on 9/19 - Potassium IV 13mEq over 1hr x2  History of PE  Pt on chronic warfarin for remote, recurrent PE. On presentation, INR subtherapeutic in setting of reported missed doses at home.he was placed on heparin infusion because of any surgical intervention if needed. Restarted on Coumadin 9/15. Coumadin discontinued on 9/17 and Vitamin K administered in anticipation of upcoming surgical procedure. - Heparin per pharmacy  Parkinson's Disease, Dementia  Pt with a history of advanced dementia and Parkinson's disease, low functional status and minimally interactive at baseline.  --Continue home memantine, Sinemet, Seroquel     Dispo: Anticipated discharge in approximately 2-4 day(s).    Neva Seat, MD 06/08/2017, 8:37 AM Pager: [phone]

## 2017-06-08 NOTE — Anesthesia Postprocedure Evaluation (Signed)
Anesthesia Post Note  [name]  Procedure(s) Performed: Procedure(s) (LRB): SIGMOID  COLECTOMY (N/A)     Patient location during evaluation: PACU Anesthesia Type: General Level of consciousness: awake Pain management: pain level controlled Vital Signs Assessment: post-procedure vital signs reviewed and stable Respiratory status: spontaneous breathing Cardiovascular status: stable Anesthetic complications: no    Last Vitals:  Vitals:   06/08/17 1535 06/08/17 1550  BP: (!) 180/103 (!) 176/106  Pulse: 75 75  Resp: 11 14  Temp: (!) 36.4 C   SpO2: 97% 98%    Last Pain:  Vitals:   06/08/17 0641  TempSrc: Oral  PainSc:                  [name]

## 2017-06-08 NOTE — Progress Notes (Signed)
PT Cancellation Note  Patient Details Name: [name] [name] MRN: [id number] DOB: 1931/01/14   Cancelled Treatment:    Reason Eval/Treat Not Completed: Patient at procedure or test/unavailable; patient going for colectomy today.  Will attempt another day.   Reginia Naas 06/08/2017, 8:39 AM  Magda Kiel, [place] 06/08/2017

## 2017-06-08 NOTE — Progress Notes (Signed)
  Date: 06/08/2017  Patient name: [name] [name]  Medical record number: [id number]  Date of birth: Dec 22, 1930   I have seen and evaluated this patient and I have discussed the plan of care with the house staff. Please see Dr. Tally Joe note for complete details. I concur with his findings with the following additions/corrections:   Sigmoidoscopy for decompression occurred on 9/18 and surgery is planned for today.  We saw patient prior to presenting for surgery.    Sid Falcon, MD 06/08/2017, 2:48 PM

## 2017-06-08 NOTE — Progress Notes (Signed)
Day of Surgery   Subjective/Chief Complaint: In pre-op   Objective: Vital signs in last 24 hours: Temp:  [98.1 F (36.7 C)-98.3 F (36.8 C)] 98.3 F (36.8 C) (09/19 0641) Pulse Rate:  [67] 67 (09/19 0641) Resp:  [17] 17 (09/19 0641) BP: (123-154)/(95-98) 123/98 (09/19 0641) SpO2:  [99 %-100 %] 100 % (09/19 0641) Last BM Date: 06/08/17  Intake/Output from previous day: 09/18 0701 - 09/19 0700 In: 4545.4 [P.O.:1380; I.V.:3165.4] Out: 3950 [Urine:3950] Intake/Output this shift: Total I/O In: 0  Out: 650 [Urine:650]  General appearance: no distress Resp: clear to auscultation bilaterally GI: soft, less distended, NT  Lab Results:   Recent Labs  06/07/17 0657 06/08/17 0511  WBC 2.5* 3.9*  HGB 12.4* 12.1*  HCT 37.8* 36.9*  PLT 179 164   BMET  Recent Labs  06/06/17 0303 06/08/17 0511  NA 138 139  K 3.7 3.3*  CL 109 106  CO2 24 25  GLUCOSE 105* 68  BUN <5* <5*  CREATININE 0.90 0.81  CALCIUM 8.5* 8.7*   PT/INR  Recent Labs  06/07/17 0505 06/08/17 0511  LABPROT 19.0* 16.4*  INR 1.61 1.33   ABG No results for input(s): PHART, HCO3 in the last 72 hours.  Invalid input(s): PCO2, PO2  Studies/Results: No results found.  Anti-infectives: Anti-infectives    None      Assessment/Plan: Recurrent sigmoid volvulus - for sigmoid colectomy. Discussed again with his daughter. Questions answered.  LOS: 8 days    [name] [name] E 06/08/2017

## 2017-06-08 NOTE — Op Note (Signed)
05/30/2017 - 06/08/2017  2:59 PM  PATIENT:  [name] [name]  83 y.o. male  PRE-OPERATIVE DIAGNOSIS:  SIGMOID VOLVULUS  POST-OPERATIVE DIAGNOSIS:  SIGMOID VOLVULUS  PROCEDURE:  Procedure(s): SIGMOID  COLECTOMY  SURGEON:  Surgeon(s): Georganna Skeans, MD  ASSISTANTS: Nedra Hai, MD   ANESTHESIA:   general  EBL:  Total I/O In: 700 [I.V.:700] Out: 850 [Urine:850]  BLOOD ADMINISTERED:none  DRAINS: none   SPECIMEN:  Excision  DISPOSITION OF SPECIMEN:  PATHOLOGY  COUNTS:  YES  DICTATION: .Dragon Dictation Procedure in detail: Mr. Miles presents for sigmoid colectomy. Informed consent was obtained from his daughter. He received intravenous antibiotics. He was brought to the operating room and general endotracheal anesthesia was a Company secretary by the anesthesia staff. Foley catheter was placed by nursing. His abdomen was prepped and draped in a sterile fashion. Time out procedure was performed. Limited midline incision was performed. Subcutaneous tissues were dissected down and the fascia was divided along the midline. The peritoneal cavity was entered under direct vision without difficulty. There were no significant adhesions. The sigmoid colon was easily delivered out into the wound. We used the colon protocol draping. It was chronically dilated. It was divided proximal to the chronic dilatation with GIA-75 stapler. It was then divided distally at the rectosigmoid with GIA-75 stapler. The mesentery was taken down using the LigaSure. We then performed a side-to-side anastomosis using GIA-75 stapler. The common defect was closed with a TA 60 stapler. Staple line was reinforced with interrupted 3-0 silk's which also assisted in hemostasis. There was a widely patent anastomosis. Apical stitch of 2-0 silk was also placed. The specimen was marked for pathology and sent. The abdomen was irrigated and hemostasis was ensured. We then changed our gowns, gloves, drapes, and instruments according to  the colon protocol. The midline fascia was closed with running #1 looped PDS. Subcutaneous tissues were irrigated and the skin was closed with staples. All counts were correct. He tolerated the procedure without apparent complication and was taken recovery in stable condition. PATIENT DISPOSITION:  PACU - hemodynamically stable.   Delay start of Pharmacological VTE agent (>24hrs) due to surgical blood loss or risk of bleeding:  no  Georganna Skeans, MD, MPH, FACS Pager: [phone]  9/19/20182:59 PM

## 2017-06-08 NOTE — Progress Notes (Signed)
[place] for heparin Indication: history of PE  Allergies  Allergen Reactions  . Aricept [Donepezil Hcl] Other (See Comments)    Symptomatic Bradycardia.  . Other Other (See Comments)    Shrimp gives him gout  . Shellfish-Derived Products Other (See Comments)    Causes a flare up with Gout    Patient Measurements: Height: 5\' 6"  (167.6 cm) Weight: 149 lb (67.6 kg) IBW/kg (Calculated) : 63.8  Heparin dosing wt: 67.6 kg   Vital Signs: Temp: 98.3 F (36.8 C) (09/19 0641) Temp Source: Oral (09/19 0641) BP: 123/98 (09/19 0641) Pulse Rate: 67 (09/19 0641)  Labs:  Recent Labs  06/06/17 0303 06/07/17 0505 06/07/17 0657 06/07/17 2157 06/08/17 0511  HGB 11.4*  --  12.4*  --  12.1*  HCT 35.3*  --  37.8*  --  36.9*  PLT 161  --  179  --  164  LABPROT 27.9* 19.0*  --   --  16.4*  INR 2.63 1.61  --   --  1.33  HEPARINUNFRC  --   --   --  0.80* 0.90*  CREATININE 0.90  --   --   --  0.81    Estimated Creatinine Clearance: 61.3 mL/min (by C-G formula based on SCr of 0.81 mg/dL).  Assessment: 83 y.o. male with h/o PE, Coumadin on hold, for heparin Goal of Therapy:  Heparin level 0.3-0.7 units/ml Monitor platelets by anticoagulation protocol: Yes   Plan:  Decrease Heparin 800 units/hr Check heparin level in 8 hours.  Phillis Knack, PharmD, BCPS 06/08/2017 6:56 AM

## 2017-06-08 NOTE — Anesthesia Procedure Notes (Signed)
Procedure Name: Intubation Date/Time: 06/08/2017 1:52 PM Performed by: Orlie Dakin Pre-anesthesia Checklist: Patient identified, Emergency Drugs available, Suction available, Patient being monitored and Timeout performed Patient Re-evaluated:Patient Re-evaluated prior to induction Oxygen Delivery Method: Circle system utilized Preoxygenation: Pre-oxygenation with 100% oxygen Induction Type: IV induction, Rapid sequence and Cricoid Pressure applied Laryngoscope Size: Mac and 4 Grade View: Grade I Tube type: Oral Tube size: 7.5 mm Number of attempts: 1 Airway Equipment and Method: Stylet Placement Confirmation: ETT inserted through vocal cords under direct vision,  positive ETCO2 and breath sounds checked- equal and bilateral Secured at: 22 cm Tube secured with: Tape Dental Injury: Teeth and Oropharynx as per pre-operative assessment  Comments: DL per Paramedic rotating for DLs and intubations and supervised per Dr Linna Caprice.   4x4s bite block used.

## 2017-06-08 NOTE — Anesthesia Preprocedure Evaluation (Addendum)
Anesthesia Evaluation  Patient identified by MRN, date of birth, ID band Patient confused    Reviewed: Allergy & Precautions, NPO status , Patient's Chart, lab work & pertinent test results  Airway Mallampati: II  TM Distance: >3 FB     Dental   Pulmonary    breath sounds clear to auscultation       Cardiovascular hypertension,  Rhythm:Regular Rate:Normal     Neuro/Psych    GI/Hepatic   Endo/Other    Renal/GU      Musculoskeletal   Abdominal (+)   Bowel sounds: increased.  Peds  Hematology   Anesthesia Other Findings   Reproductive/Obstetrics                            Anesthesia Physical Anesthesia Plan  ASA: IV  Anesthesia Plan: General   Post-op Pain Management:    Induction: Intravenous  PONV Risk Score and Plan: Ondansetron and Dexamethasone  Airway Management Planned: Oral ETT  Additional Equipment:   Intra-op Plan:   Post-operative Plan: Possible Post-op intubation/ventilation  Informed Consent: I have reviewed the patients History and Physical, chart, labs and discussed the procedure including the risks, benefits and alternatives for the proposed anesthesia with the patient or authorized representative who has indicated his/her understanding and acceptance.     Plan Discussed with: CRNA and Anesthesiologist  Anesthesia Plan Comments:         Anesthesia Quick Evaluation

## 2017-06-09 ENCOUNTER — Encounter (HOSPITAL_COMMUNITY): Payer: Self-pay | Admitting: General Surgery

## 2017-06-09 LAB — CBC
HCT: 37.2 % — ABNORMAL LOW (ref 39.0–52.0)
Hemoglobin: 12 g/dL — ABNORMAL LOW (ref 13.0–17.0)
MCH: 30.2 pg (ref 26.0–34.0)
MCHC: 32.3 g/dL (ref 30.0–36.0)
MCV: 93.7 fL (ref 78.0–100.0)
Platelets: 184 10*3/uL (ref 150–400)
RBC: 3.97 MIL/uL — ABNORMAL LOW (ref 4.22–5.81)
RDW: 14.7 % (ref 11.5–15.5)
WBC: 8.7 10*3/uL (ref 4.0–10.5)

## 2017-06-09 LAB — CUP PACEART REMOTE DEVICE CHECK
Implantable Pulse Generator Implant Date: 20160328
MDC IDC SESS DTM: 20180916034117

## 2017-06-09 LAB — BASIC METABOLIC PANEL
ANION GAP: 11 (ref 5–15)
BUN: 6 mg/dL (ref 6–20)
CALCIUM: 8.6 mg/dL — AB (ref 8.9–10.3)
CO2: 25 mmol/L (ref 22–32)
Chloride: 101 mmol/L (ref 101–111)
Creatinine, Ser: 0.89 mg/dL (ref 0.61–1.24)
GFR calc non Af Amer: >60 mL/min (ref >60)
GLUCOSE: 131 mg/dL — AB (ref 65–99)
POTASSIUM: 3.6 mmol/L (ref 3.5–5.1)
Sodium: 137 mmol/L (ref 135–145)

## 2017-06-09 LAB — HEPARIN LEVEL (UNFRACTIONATED)
HEPARIN UNFRACTIONATED: 0.5 [IU]/mL (ref 0.30–0.70)
Heparin Unfractionated: 0.1 IU/mL — ABNORMAL LOW (ref 0.30–0.70)

## 2017-06-09 LAB — PROTIME-INR
INR: 1.27
Prothrombin Time: 15.8 seconds — ABNORMAL HIGH (ref 11.4–15.2)

## 2017-06-09 MED ORDER — WARFARIN SODIUM 5 MG PO TABS
5.0000 mg | ORAL_TABLET | Freq: Once | ORAL | Status: AC
  Administered 2017-06-09: 5 mg via ORAL
  Filled 2017-06-09: qty 1

## 2017-06-09 MED ORDER — ACETAMINOPHEN 325 MG PO TABS
650.0000 mg | ORAL_TABLET | Freq: Four times a day (QID) | ORAL | Status: DC
  Administered 2017-06-09 – 2017-06-14 (×22): 650 mg via ORAL
  Filled 2017-06-09 (×22): qty 2

## 2017-06-09 MED ORDER — TRAMADOL HCL 50 MG PO TABS
50.0000 mg | ORAL_TABLET | Freq: Four times a day (QID) | ORAL | Status: DC | PRN
  Administered 2017-06-09 – 2017-06-14 (×4): 50 mg via ORAL
  Filled 2017-06-09 (×5): qty 1

## 2017-06-09 MED ORDER — WARFARIN - PHARMACIST DOSING INPATIENT
Freq: Every day | Status: DC
  Administered 2017-06-10 – 2017-06-13 (×3)
  Administered 2017-06-14: 1

## 2017-06-09 MED ORDER — QUETIAPINE FUMARATE 50 MG PO TABS: 25.0000 mg | ORAL_TABLET | Freq: Every day | ORAL | Status: DC | PRN

## 2017-06-09 MED ORDER — MORPHINE SULFATE (PF) 4 MG/ML IV SOLN: 2.0000 mg | INTRAVENOUS | Status: DC | PRN

## 2017-06-09 NOTE — Progress Notes (Addendum)
   Subjective: Patient was seen sleeping  in his bed this morning. He was difficult to rouse this morning. He underwent successful colectomy yesterday.    Objective:  Vital signs in last 24 hours: Vitals:   06/08/17 1620 06/08/17 1638 06/08/17 2108 06/09/17 0525  BP: (!) 163/94 (!) 166/98 (!) 180/109 (!) 167/92  Pulse: 77 74 (!) 105 91  Resp: 13 14 16 15   Temp: (!) 97.5 F (36.4 C)  98.2 F (36.8 C) 98.8 F (37.1 C)  TempSrc:   Oral   SpO2: 97% 94% 95% 97%  Weight:      Height:       Gen. Well-developed elderly man, in no acute distress.  Abdomen. mildly distended, bowel sounds positive, Surgical site without erythema.. Chest. Clear bilaterally. CV. Regular rate and rhythm. Extremities.no edema, no cyanosis, pulses intact and symmetrical.  Assessment/Plan:  Sigmoid Volvulus. Repeat KUB prior to discharge showed evidence of recurrent volvulus. Family opted to remain in hospital and pusue surgical intervention. Underwent 3rd Volvulus decompression of this admission in preparation for surgical intervention. Successful colectomy on 9/19. - Appreciate Surgery Reccomendations - Clear liquid diet - Pain control with scheduled tylenol and Tramadol PRN - PT eval  Hypokalemia:  Potassium 4.2 on 9/16, K 3.3 on 9/19, Improved to 3.6 9/20  History of PE  Pt on chronic warfarin for remote, recurrent PE. On presentation, INR subtherapeutic in setting of reported missed doses at home.he was placed on heparin infusion because of any surgical intervention if needed. Restarted on Coumadin 9/15. Coumadin discontinued on 9/17 and Vitamin K administered in anticipation of upcoming surgical procedure. Surgery okay with resuming Warfarin. - Heparin per pharmacy, bridge to Warfarin per pharmacy.   Parkinson's Disease, Dementia  Pt with a history of advanced dementia and Parkinson's disease, low functional status and minimally interactive at baseline.  --Continue home memantine, Sinemet, Seroquel       Dispo: Anticipated discharge in approximately 1-3 day(s).   Neva Seat, MD 06/09/2017, 10:50 AM Pager: [phone]

## 2017-06-09 NOTE — Progress Notes (Signed)
[facility] Progress Note  1 Day Post-Op  Subjective: CC:  No family at bedside. Patient is asleep. Drowsy this morning. Non-verbal.  Objective: Vital signs in last 24 hours: Temp:  [97.5 F (36.4 C)-98.8 F (37.1 C)] 98.8 F (37.1 C) (09/20 0525) Pulse Rate:  [74-105] 91 (09/20 0525) Resp:  [11-16] 15 (09/20 0525) BP: (160-180)/(92-109) 167/92 (09/20 0525) SpO2:  [94 %-98 %] 97 % (09/20 0525) Last BM Date: 06/08/17  Intake/Output from previous day: 09/19 0701 - 09/20 0700 In: 2795 [P.O.:120; I.V.:2600] Out: 2500 [Urine:2450; Blood:50] Intake/Output this shift: No intake/output data recorded.  PE: Gen:  Alert, NAD, pleasant Card:  Regular rate and rhythm Pulm:  Normal effort Abd: Soft, appropriately tender, non-distended, bowel sounds present, honeycomb with some sanguinous drainage on inferior aspect of bandage - no active bleeding appreciated. Skin: warm and dry, no rashes  Psych: A&Ox3   Lab Results:   Recent Labs  06/08/17 0511 06/09/17 0436  WBC 3.9* 8.7  HGB 12.1* 12.0*  HCT 36.9* 37.2*  PLT 164 184   BMET  Recent Labs  06/08/17 0511 06/09/17 0436  NA 139 137  K 3.3* 3.6  CL 106 101  CO2 25 25  GLUCOSE 68 131*  BUN <5* 6  CREATININE 0.81 0.89  CALCIUM 8.7* 8.6*   PT/INR  Recent Labs  06/08/17 0511 06/09/17 0436  LABPROT 16.4* 15.8*  INR 1.33 1.27   CMP     Component Value Date/Time   NA 137 06/09/2017 0436   NA 145 (H) 02/17/2017 1204   K 3.6 06/09/2017 0436   CL 101 06/09/2017 0436   CO2 25 06/09/2017 0436   GLUCOSE 131 (H) 06/09/2017 0436   BUN 6 06/09/2017 0436   BUN 14 02/17/2017 1204   CREATININE 0.89 06/09/2017 0436   CREATININE 0.96 12/23/2014 1214   CALCIUM 8.6 (L) 06/09/2017 0436   PROT 6.8 05/30/2017 1403   PROT 6.7 02/17/2017 1204   ALBUMIN 3.7 05/30/2017 1403   ALBUMIN 4.2 02/17/2017 1204   AST 30 05/30/2017 1403   ALT 6 (L) 05/30/2017 1403   ALKPHOS 37 (L) 05/30/2017 1403   BILITOT 0.7  05/30/2017 1403   BILITOT 0.3 02/17/2017 1204   GFRNONAA >60 06/09/2017 0436   GFRNONAA 74 12/23/2014 1214   GFRAA >60 06/09/2017 0436   GFRAA 85 12/23/2014 1214   Lipase     Component Value Date/Time   LIPASE 27 05/30/2017 1403    Anti-infectives: Anti-infectives    Start     Dose/Rate Route Frequency Ordered Stop   06/08/17 1415  cefoTEtan (CEFOTAN) 2 g in dextrose 5 % 50 mL IVPB     2 g 100 mL/hr over 30 Minutes Intravenous To Surgery 06/08/17 1401 06/08/17 1421     Assessment/Plan Alzheimer's/Parkinson's Disease HTN HLD Hx PE on coumadin  Recurrent sigmoid volvulus POD#1 S/P SIGMOID COLECTOMY 06/08/17, Dr. Georganna Skeans  - afebrile, VSS, hemodynamically stable   - continue clear liquid diet until patient reliably having bowel function and tolerating PO  - start PO pain control with scheduled tylenol and PRN ULTRAM   - IS, mobilize/OOB, agree with PT eval   FYB:OFBPZ liquid diet ID: perioperative cefotetan  VTE: SCD's, heparin gtt; ok to resume coumadin from surgical perspective  Foley: good UOP (>2 L/24h), D/C foley. May place condom cath as needed.   LOS: 9 days    Jill Alexanders , [facility] 06/09/2017, 10:19 AM Pager: [phone] Consults: [phone] Mon-Fri 7:00 am-4:30 pm  Sat-Sun 7:00 am-11:30 am

## 2017-06-09 NOTE — Care Management Note (Signed)
Case Management Note  Patient Details  Name: Courage Biglow MRN: [id number] Date of Birth: 15-Apr-1931  Subjective/Objective:                    Action/Plan:  colectomy on 9/19. Awaiting post op PT eval Continuing to follow for discharge needs Expected Discharge Date:  06/05/17               Expected Discharge Plan:     In-House Referral:     Discharge planning Services  CM Consult  Post Acute Care Choice:    Choice offered to:     DME Arranged:    DME Agency:     HH Arranged:    [place] Agency:     Status of Service:  In process, will continue to follow  If discussed at Long Length of Stay Meetings, dates discussed:    Additional Comments:  Marilu Favre, RN 06/09/2017, 11:16 AM

## 2017-06-09 NOTE — Progress Notes (Addendum)
[place] for heparin Indication: history of PE  Allergies  Allergen Reactions  . Aricept [Donepezil Hcl] Other (See Comments)    Symptomatic Bradycardia.  . Other Other (See Comments)    Shrimp gives him gout  . Shellfish-Derived Products Other (See Comments)    Causes a flare up with Gout    Patient Measurements: Height: 5\' 6"  (167.6 cm) Weight: 149 lb (67.6 kg) IBW/kg (Calculated) : 63.8  Heparin dosing wt: 67.6 kg   Vital Signs: Temp: 98.8 F (37.1 C) (09/20 0525) BP: 167/92 (09/20 0525) Pulse Rate: 91 (09/20 0525)  Labs:  Recent Labs  06/07/17 0505  06/07/17 0657  06/08/17 0511 06/08/17 1548 06/09/17 0436 06/09/17 1040  HGB  --   < > 12.4*  --  12.1*  --  12.0*  --   HCT  --   --  37.8*  --  36.9*  --  37.2*  --   PLT  --   --  179  --  164  --  184  --   LABPROT 19.0*  --   --   --  16.4*  --  15.8*  --   INR 1.61  --   --   --  1.33  --  1.27  --   HEPARINUNFRC  --   --   --   < > 0.90* 0.26*  --  0.10*  CREATININE  --   --   --   --  0.81  --  0.89  --   < > = values in this interval not displayed.  Estimated Creatinine Clearance: 55.8 mL/min (by C-G formula based on SCr of 0.89 mg/dL).  Assessment: 83 y.o. male with h/o PE, Coumadin on hold, for heparin  Hep lvl low at 800 units/hr Cbc stable  Goal of Therapy:  Heparin level 0.3-0.7 units/ml Monitor platelets by anticoagulation protocol: Yes   Plan:  Increase hep to 1000 units/hr 2000 HL Daily Hl, CBC, INR Resume warfarin tonight 5 mg  Levester Fresh, PharmD, BCPS, BCCCP Clinical Pharmacist Clinical phone for 06/09/2017 from 7a-3:30p: [phone] If after 3:30p, please call main pharmacy at: [phone] 06/09/2017 12:56 PM

## 2017-06-09 NOTE — Progress Notes (Signed)
ANTICOAGULATION CONSULT NOTE - FOLLOW UP    HL = 0.5 (goal 0.3 - 0.7 units/mL) Heparin dosing weight = 68 kg   Assessment: 86 YOF with history of recurrent PE to continue on IV heparin while INR is sub-therapeutic on Coumadin.  Heparin level is therapeutic; no bleeding reported.   Plan: Continue heparin gtt at 1000 units/hr F/U AM labs   Jeorgia Helming D. Mina Marble, PharmD, BCPS 06/09/2017, 8:35 PM

## 2017-06-09 NOTE — Progress Notes (Signed)
Physical Therapy Treatment Patient Details Name: [name] [name] MRN: [id number] DOB: 03-Oct-1930 Today's Date: 06/09/2017    History of Present Illness [name] [name] is an 83 y.o m with hx of htn, parkinson's disease, prior sigmoid volvulus which was corrected by flexible sigmoidoscopy x2, prostate cancer, dementia, previous pulmonary embolus on coumadin, and CAD who presented for abdominal distension and decreased appetite.  Underwent sigmoidoscopy showing recurrent sigmoid volvulus.. Pt was to D/C when volvulus reoccured and now s/p sigmoid colectomy 06/08/17.    PT Comments    Patient progressing over last session with improved sitting balance surprising due to recent abdominal surgery.  Patient still limited with standing due to postural deficits, weakness and motor planning delays.  Feel continued skilled PT in the acute setting indicated to progress to home with family support.  Will need hoyer lift for safety transfers at least initially at home.    Follow Up Recommendations  Supervision/Assistance - 24 hour;Home health PT     Equipment Recommendations  Other (comment) (hoyer lift)    Recommendations for Other Services       Precautions / Restrictions Precautions Precautions: Fall    Mobility  Bed Mobility Overal bed mobility: Needs Assistance Bed Mobility: Rolling;Sidelying to Sit Rolling: Mod assist Sidelying to sit: Mod assist;+2 for physical assistance Supine to sit: Max assist;+2 for physical assistance        Transfers Overall transfer level: Needs assistance   Transfers: Sit to/from Stand Sit to Stand: Max assist;+2 physical assistance;From elevated surface         General transfer comment: stood x 2 from EOB lifting with pad under pt, both knees blocked  Ambulation/Gait                 Stairs            Wheelchair Mobility    Modified Rankin (Stroke Patients Only)       Balance Overall balance assessment: Needs  assistance Sitting-balance support: Feet supported Sitting balance-Leahy Scale: Fair Sitting balance - Comments: sitting up EOB with S rather erect posture over about 8 minutes, initially min a support for balance   Standing balance support: Bilateral upper extremity supported Standing balance-Leahy Scale: Zero Standing balance comment: max A for standing and unable to maintain on his own                            Cognition Arousal/Alertness: Lethargic Behavior During Therapy: Flat affect Overall Cognitive Status: Impaired/Different from baseline                                 General Comments: nonverbal this session except one explitive when assisting up to sit      Exercises Other Exercises Other Exercises: PROM to cervical joints for rotation due to rigidity through head and neck    General Comments        Pertinent Vitals/Pain Faces Pain Scale: Hurts little more Pain Location: grimacing with supine to sit and sit to supine (abdominal pain likely) Pain Descriptors / Indicators: Grimacing Pain Intervention(s): Repositioned;Monitored during session    Home Living                      Prior Function            PT Goals (current goals can now be found in the care plan section) Progress towards PT  goals: Progressing toward goals    Frequency    Min 3X/week      PT Plan Current plan remains appropriate    Co-evaluation              AM-PAC PT "6 Clicks" Daily Activity  Outcome Measure  Difficulty turning over in bed (including adjusting bedclothes, sheets and blankets)?: Unable Difficulty moving from lying on back to sitting on the side of the bed? : Unable Difficulty sitting down on and standing up from a chair with arms (e.g., wheelchair, bedside commode, etc,.)?: Unable Help needed moving to and from a bed to chair (including a wheelchair)?: Total Help needed walking in hospital room?: Total Help needed climbing  3-5 steps with a railing? : Total 6 Click Score: 6    End of Session Equipment Utilized During Treatment: Gait belt Activity Tolerance: Patient tolerated treatment well Patient left: in bed;with call bell/phone within reach;with bed alarm set   PT Visit Diagnosis: Other abnormalities of gait and mobility (R26.89);Other symptoms and signs involving the nervous system (R29.898);Muscle weakness (generalized) (M62.81)     Time: 4287-6811 PT Time Calculation (min) (ACUTE ONLY): 21 min  Charges:  $Therapeutic Activity: 8-22 mins                    G CodesMagda [name], [place] [phone] 06/09/2017    [name] [name] 06/09/2017, 2:01 PM

## 2017-06-09 NOTE — Progress Notes (Signed)
  Date: 06/09/2017  Patient name: [name] [name]  Medical record number: [id number]  Date of birth: Oct 26, 1930   I have seen and evaluated this patient and I have discussed the plan of care with the house staff. Please see Dr. Tally Joe note for complete details. I concur with his findings.    Sid Falcon, MD 06/09/2017, 5:10 PM

## 2017-06-10 LAB — BASIC METABOLIC PANEL
ANION GAP: 5 (ref 5–15)
BUN: 7 mg/dL (ref 6–20)
CO2: 28 mmol/L (ref 22–32)
Calcium: 8.5 mg/dL — ABNORMAL LOW (ref 8.9–10.3)
Chloride: 106 mmol/L (ref 101–111)
Creatinine, Ser: 0.75 mg/dL (ref 0.61–1.24)
Glucose, Bld: 96 mg/dL (ref 65–99)
POTASSIUM: 3.4 mmol/L — AB (ref 3.5–5.1)
SODIUM: 139 mmol/L (ref 135–145)

## 2017-06-10 LAB — HEPARIN LEVEL (UNFRACTIONATED): HEPARIN UNFRACTIONATED: 0.44 [IU]/mL (ref 0.30–0.70)

## 2017-06-10 LAB — PROTIME-INR
INR: 2.12
Prothrombin Time: 23.5 seconds — ABNORMAL HIGH (ref 11.4–15.2)

## 2017-06-10 LAB — CBC
HEMATOCRIT: 31.9 % — AB (ref 39.0–52.0)
HEMOGLOBIN: 10.4 g/dL — AB (ref 13.0–17.0)
MCH: 31.1 pg (ref 26.0–34.0)
MCHC: 32.6 g/dL (ref 30.0–36.0)
MCV: 95.5 fL (ref 78.0–100.0)
Platelets: 133 10*3/uL — ABNORMAL LOW (ref 150–400)
RBC: 3.34 MIL/uL — ABNORMAL LOW (ref 4.22–5.81)
RDW: 15.5 % (ref 11.5–15.5)
WBC: 5.2 10*3/uL (ref 4.0–10.5)

## 2017-06-10 MED ORDER — WARFARIN SODIUM 1 MG PO TABS
1.0000 mg | ORAL_TABLET | Freq: Once | ORAL | Status: AC
  Administered 2017-06-10: 1 mg via ORAL
  Filled 2017-06-10: qty 1

## 2017-06-10 MED ORDER — ENSURE ENLIVE PO LIQD
237.0000 mL | Freq: Three times a day (TID) | ORAL | Status: DC
  Administered 2017-06-10 – 2017-06-14 (×12): 237 mL via ORAL

## 2017-06-10 MED ORDER — POTASSIUM CHLORIDE 10 MEQ/100ML IV SOLN
10.0000 meq | INTRAVENOUS | Status: AC
  Administered 2017-06-10 (×4): 10 meq via INTRAVENOUS
  Filled 2017-06-10 (×4): qty 100

## 2017-06-10 NOTE — Progress Notes (Signed)
   Subjective: Patient was seen sleeping in his bed this morning. He was difficult to rouse again this morning, he has been more alert during the day. He underwent successful colectomy 9/20.    Objective:  Vital signs in last 24 hours: Vitals:   06/09/17 0525 06/09/17 1512 06/09/17 2334 06/10/17 0445  BP: (!) 167/92 (!) 157/84 (!) 159/95 (!) 147/86  Pulse: 91 92 85 86  Resp: 15 16 16 17   Temp: 98.8 F (37.1 C) 99.4 F (37.4 C) 98.8 F (37.1 C) 98.4 F (36.9 C)  TempSrc:  Oral Axillary Axillary  SpO2: 97% 93% 95% 96%  Weight:      Height:       Gen. Well-developed elderly man, in no acute distress.  Abdomen. mildly distended, bowel sounds positive but decreased, Surgical site without erythema or significant drainage. Chest. Clear bilaterally. CV. Regular rate and rhythm. Extremities.no edema, no cyanosis, pulses intact and symmetrical.  Assessment/Plan:  Sigmoid Volvulus. Repeat KUB prior to discharge showed evidence of recurrent volvulus. Family opted to remain in hospital and pusue surgical intervention. Underwent 3rd Volvulus decompression of this admission in preparation for surgical intervention. Successful colectomy on 9/19. - Appreciate Surgery Reccomendations - Clear liquid diet - Pain control with scheduled tylenol and Tramadol PRN - PT  Hypokalemia:  Potassium 4.2 on 9/16, K 3.3 on 9/19, Improved to 3.6 9/20 following 2 runs of K. Decreased again to 3.4 on 9/21. - Potassium IV 76mEq over 1hr x4  History of PE  Pt on chronic warfarin for remote, recurrent PE. On presentation, INR subtherapeutic in setting of reported missed doses at home.he was placed on heparin infusion because of any surgical intervention if needed. Restarted on Coumadin 9/15. Coumadin discontinued on 9/17 and Vitamin K administered in anticipation of upcoming surgical procedure. Surgery okay with resuming Warfarin. - Heparin per pharmacy, bridge to Warfarin per pharmacy.   Parkinson's Disease,  Dementia  Pt with a history of advanced dementia and Parkinson's disease, low functional status and minimally interactive at baseline.  - Continue home memantine, Sinemet - Continue Seroquel PRN     Dispo: Anticipated discharge in approximately 1-3 day(s).   Neva Seat, MD 06/10/2017, 1:56 PM Pager: [phone]

## 2017-06-10 NOTE — Consult Note (Signed)
   [facility] Inpatient Consult   06/10/2017  [name] [name] 05/01/1931 [id number]  Patient assessed for long length of stay in the [facility] [place].  Met with the patient and daughter.  Patient did not verbalize while asking him questions. Patient is a s/p sigmoid colectomy.  Has been out reached in the past.  Daughter states she is hopeful that she can get him moving again as she works.  Chart reviewed and explained [facility] Care Management services and she states she remembers from the past encounters.  She states, "I don't know..we got to get him moving.  She did accept a brochure, 24 hour nurse advise line magnet, information regarding post hospital EMMI.  Patient's primary care provider is Dr. Joni Reining with [facility] clinic.  No needs expressed at this time as she is unsure of his discharge at this time.  For questions, please contact:  Natividad Brood, RN BSN [facility] Liaison  [phone] business mobile phone Toll free office [phone]

## 2017-06-10 NOTE — Progress Notes (Signed)
[name] [name] for heparin + Coumadin Indication: history of PE  Allergies  Allergen Reactions  . Aricept [Donepezil Hcl] Other (See Comments)    Symptomatic Bradycardia.  . Other Other (See Comments)    Shrimp gives him gout  . Shellfish-Derived Products Other (See Comments)    Causes a flare up with Gout   Patient Measurements: Height: 5\' 6"  (167.6 cm) Weight: 149 lb (67.6 kg) IBW/kg (Calculated) : 63.8  Heparin dosing wt: 67.6 kg  Assessment: 83 y.o. male with h/o PE. On heparin and Coumadin bridge for hx of PE. Coumadin restarted s/p surgery on 9/20. Last heparin level therapeutic at 0.44. INR jumped from 1.27 to 2.12 after one dose. Hgb 10.4, plts low. PTA warfarin: 5 mg Mon/Wed, 2.5 mg all other days (goal 2-3)  Goal of Therapy:  Heparin level 0.3-0.7 units/ml Monitor platelets by anticoagulation protocol: Yes   Plan:  Continue heparin gtt at 1,000 units/hr  Give Coumadin 1mg  PO x 1 tonight  Monitor daily INR, CBC, s/s of bleed  Would consider stopping heparin gtt today but INR jump is a little odd so will check again tomorrow with heparin level and may stop heparin then  [name] [name], PharmD, BCPS Clinical Pharmacist Pager [phone] 06/10/2017 2:06 PM

## 2017-06-10 NOTE — Progress Notes (Signed)
Initial Nutrition Assessment  DOCUMENTATION CODES:   Not applicable  INTERVENTION:   -Ensure Enlive po TID, each supplement provides 350 kcal and 20 grams of protein -Feeding assistance with meals  NUTRITION DIAGNOSIS:   Inadequate oral intake related to lethargy/confusion, altered GI function as evidenced by meal completion < 50%.  GOAL:   Patient will meet greater than or equal to 90% of their needs  MONITOR:   PO intake, Supplement acceptance, Diet advancement, Labs, Weight trends, Skin, I & O's  REASON FOR ASSESSMENT:   Low Braden    ASSESSMENT:   [name] [name] is an 83 year old male with past medical history of mixed dementia with Parkinson's disease, hypertension, history of pulmonary embolism and coronary artery disease on chronic warfarin therapy with a history of sigmoid flocculus last year.  Pt admitted with sigmoid volvulus.   9/19- s/p sigmoid colectomy  Pt very somnolent at time of visit; unable to participate in hx. No family present at time of visit.   Pt is currently on a full liquid diet. Noted poor meal completion; PO: 10-50%. Suspect lethargy and lack of self-feeding skills may be contributing to poor oral intake.   Nutrition-Focused physical exam completed. Findings are no fat depletion, mild muscle depletion, and no edema. Suspect muscle depletion is consistent with advanced age and Parkinson's disease.   Labs reviewed: K: 3.4 (on IV supplementation).   Diet Order:  Diet - low sodium heart healthy Diet full liquid Room service appropriate? Yes; Fluid consistency: Thin  Skin:   (closed abdominal incision)  Last BM:  06/10/17  Height:   Ht Readings from Last 1 Encounters:  05/31/17 5\' 6"  (1.676 m)    Weight:   Wt Readings from Last 1 Encounters:  05/31/17 149 lb (67.6 kg)    Ideal Body Weight:  64.5 kg  BMI:  Body mass index is 24.05 kg/m.  Estimated Nutritional Needs:   Kcal:  1500-1700  Protein:  70-85 grams  Fluid:   1.5-1.7 L  EDUCATION NEEDS:   Education needs no appropriate at this time  Sharonne Ricketts A. Jimmye Norman, RD, LDN, CDE Pager: [phone] After hours Pager: [phone]

## 2017-06-10 NOTE — Progress Notes (Signed)
  Date: 06/10/2017  Patient name: [name] [name]  Medical record number: [id number]  Date of birth: Mar 21, 1931   I have seen and evaluated this patient and I have discussed the plan of care with the house staff. Please see Dr. Tally Joe note for complete details. I concur with his findings.    Sid Falcon, MD 06/10/2017, 3:13 PM

## 2017-06-10 NOTE — Progress Notes (Signed)
2 Days Post-Op   Subjective/Chief Complaint: No complaint   Objective: Vital signs in last 24 hours: Temp:  [98.4 F (36.9 C)-99.4 F (37.4 C)] 98.4 F (36.9 C) (09/21 0445) Pulse Rate:  [85-92] 86 (09/21 0445) Resp:  [16-17] 17 (09/21 0445) BP: (147-159)/(84-95) 147/86 (09/21 0445) SpO2:  [93 %-96 %] 96 % (09/21 0445) Last BM Date: 06/08/17  Intake/Output from previous day: 09/20 0701 - 09/21 0700 In: 2985 [P.O.:520; I.V.:2465] Out: 2200 [Urine:2200] Intake/Output this shift: Total I/O In: -  Out: 1 [Stool:1]  General appearance: no distress GI: soft, dressing with dry stain  Lab Results:   Recent Labs  06/09/17 0436 06/10/17 0704  WBC 8.7 5.2  HGB 12.0* 10.4*  HCT 37.2* 31.9*  PLT 184 133*   BMET  Recent Labs  06/09/17 0436 06/10/17 0704  NA 137 139  K 3.6 3.4*  CL 101 106  CO2 25 28  GLUCOSE 131* 96  BUN 6 7  CREATININE 0.89 0.75  CALCIUM 8.6* 8.5*   PT/INR  Recent Labs  06/09/17 0436 06/10/17 0704  LABPROT 15.8* 23.5*  INR 1.27 2.12   ABG No results for input(s): PHART, HCO3 in the last 72 hours.  Invalid input(s): PCO2, PO2  Studies/Results: No results found.  Anti-infectives: Anti-infectives    Start     Dose/Rate Route Frequency Ordered Stop   06/08/17 1415  cefoTEtan (CEFOTAN) 2 g in dextrose 5 % 50 mL IVPB     2 g 100 mL/hr over 30 Minutes Intravenous To Surgery 06/08/17 1401 06/08/17 1421      Assessment/Plan: Recurrent sigmoid volvulus POD#1 S/P SIGMOID COLECTOMY 06/08/17, Dr. Georganna Skeans  - afebrile, VSS, hemodynamically stable   - advance to fulls VTE:   coumadin  LOS: 10 days    Analyssa Downs E 06/10/2017

## 2017-06-11 DIAGNOSIS — R1084 Generalized abdominal pain: Secondary | ICD-10-CM

## 2017-06-11 LAB — BASIC METABOLIC PANEL
Anion gap: 8 (ref 5–15)
BUN: 7 mg/dL (ref 6–20)
CHLORIDE: 103 mmol/L (ref 101–111)
CO2: 25 mmol/L (ref 22–32)
CREATININE: 0.72 mg/dL (ref 0.61–1.24)
Calcium: 8.4 mg/dL — ABNORMAL LOW (ref 8.9–10.3)
GFR calc Af Amer: >60 mL/min (ref >60)
GFR calc non Af Amer: >60 mL/min (ref >60)
Glucose, Bld: 97 mg/dL (ref 65–99)
Potassium: 3.7 mmol/L (ref 3.5–5.1)
Sodium: 136 mmol/L (ref 135–145)

## 2017-06-11 LAB — CBC
HEMATOCRIT: 31.7 % — AB (ref 39.0–52.0)
HEMOGLOBIN: 10.2 g/dL — AB (ref 13.0–17.0)
MCH: 30.6 pg (ref 26.0–34.0)
MCHC: 32.2 g/dL (ref 30.0–36.0)
MCV: 95.2 fL (ref 78.0–100.0)
Platelets: 149 10*3/uL — ABNORMAL LOW (ref 150–400)
RBC: 3.33 MIL/uL — ABNORMAL LOW (ref 4.22–5.81)
RDW: 15.2 % (ref 11.5–15.5)
WBC: 4.7 10*3/uL (ref 4.0–10.5)

## 2017-06-11 LAB — HEPARIN LEVEL (UNFRACTIONATED): Heparin Unfractionated: <0.1 IU/mL — ABNORMAL LOW (ref 0.30–0.70)

## 2017-06-11 LAB — PROTIME-INR
INR: 2.42
Prothrombin Time: 26.1 seconds — ABNORMAL HIGH (ref 11.4–15.2)

## 2017-06-11 MED ORDER — WARFARIN SODIUM 5 MG PO TABS
2.5000 mg | ORAL_TABLET | Freq: Once | ORAL | Status: AC
  Administered 2017-06-11: 2.5 mg via ORAL
  Filled 2017-06-11: qty 1

## 2017-06-11 NOTE — Progress Notes (Signed)
   Subjective: no acute overnight event. Patient was sleeping comfortably. Daughter was present at the bedside. According to her he is tolerating full record diet very well, had 2 bowel movements yesterday. He only complains of pain at surgical site when they move him around. She was little concerned for his disposition, she wants to take him home but at the same time was concerned that she might not be able to provide him 24 hour assistance. She will discuss with another sibling and might consider a skilled nursing facility for couple of weeks. According to her if she will consider a skilled nursing facility she wants [facility] as it is closer for her home and one of her aunt resides there.  Objective:  Vital signs in last 24 hours: Vitals:   06/09/17 2334 06/10/17 0445 06/10/17 2059 06/11/17 0441  BP: (!) 159/95 (!) 147/86 (!) 166/82 (!) 161/90  Pulse: 85 86 77 69  Resp: 16 17 17 17   Temp: 98.8 F (37.1 C) 98.4 F (36.9 C) 98.4 F (36.9 C) 98.9 F (37.2 C)  TempSrc: Axillary Axillary Axillary Axillary  SpO2: 95% 96% 95% 98%  Weight:      Height:       Gen. Well-developed elderly man,sleeping comfortably in his bed, in no acute distress. Chest. Decreased breath sounds at bases on anterior auscultation. CV. Regular rate and rhythm. Abdomen.  Soft, surgical side with honeycomb dressing without any sign of more bleeding or discharge, bowel sounds positive. Extremities. No edema, no cyanosis, pulses intact and symmetrical.  Assessment/Plan:  Sigmoid Volvulus s/p Sigmoid colectomy on 06/08/17. Postoperative day 3: patient seems improving, tolerating full liquid very well, having bowel movements. Family needs to decide on his disposition,, home versus skilled nursing facility. Surgery Him on Ringer lactate at 100 mL per hour- I decreased it to 50 mL per hour as he is tolerating clear liquid, we will plan to discontinue fluids today once he started tolerating full liquids. -Continue full  liquid diet. -Continue pain control with scheduled Tylenol and when necessary tramadol.  Hypokalemia:  resolve today with potassium of 3.7. He continued to get low potassium despite normal magnesium. -Keep monitoring-replace as needed.  History of PE. He was restarted on Coumadin, INR today was 2.42. Pharmacy is helping with his dosage, we appreciate their recommendations.  Parkinson's Disease, Dementia  Pt with a history of advanced dementia and Parkinson's disease, low functional status and minimally interactive at baseline.  - Continue home memantine, Sinemet - Continue Seroquel PRN   Dispo: Anticipated discharge in approximately 1-2 day(s).   Lorella Nimrod, MD 06/11/2017, 1:15 PM Pager: [phone]

## 2017-06-11 NOTE — Progress Notes (Signed)
General Surgery [facility]  Assessment & Plan: Recurrent sigmoid volvulus POD#3 S/P SIGMOID COLECTOMY 06/08/17, Dr. Georganna Skeans             - GI function returning, having BM's             - continue full liquid diet  - OOB to chair  VTE:   coumadin  Case manager and SW to address disposition.        Earnstine Regal, MD, [facility]       Office: [phone]    Chief Complaint: Recurrent sigmoid volvulus  Subjective: Patient in bed, one word response to questions.  No apparent discomfort.  Objective: Vital signs in last 24 hours: Temp:  [98.4 F (36.9 C)-98.9 F (37.2 C)] 98.9 F (37.2 C) (09/22 0441) Pulse Rate:  [69-77] 69 (09/22 0441) Resp:  [17] 17 (09/22 0441) BP: (161-166)/(82-90) 161/90 (09/22 0441) SpO2:  [95 %-98 %] 98 % (09/22 0441) Last BM Date: 06/10/17  Intake/Output from previous day: 09/21 0701 - 09/22 0700 In: 2701.3 [P.O.:480; I.V.:1821.3; IV Piggyback:400] Out: 1901 [Urine:1900; Stool:1] Intake/Output this shift: No intake/output data recorded.  Physical Exam: HEENT - sclerae clear, mucous membranes moist Neck - soft Chest - clear bilaterally Cor - RRR Abdomen - soft, mild distension; dressing intact, small serosanguinous; BS present  Lab Results:   Recent Labs  06/10/17 0704 06/11/17 0741  WBC 5.2 4.7  HGB 10.4* 10.2*  HCT 31.9* 31.7*  PLT 133* 149*   BMET  Recent Labs  06/10/17 0704 06/11/17 0741  NA 139 136  K 3.4* 3.7  CL 106 103  CO2 28 25  GLUCOSE 96 97  BUN 7 7  CREATININE 0.75 0.72  CALCIUM 8.5* 8.4*   PT/INR  Recent Labs  06/10/17 0704 06/11/17 0741  LABPROT 23.5* 26.1*  INR 2.12 2.42   Comprehensive Metabolic Panel:    Component Value Date/Time   NA 136 06/11/2017 0741   NA 139 06/10/2017 0704   NA 145 (H) 02/17/2017 1204   NA 144 12/09/2016 1114   K 3.7 06/11/2017 0741   K 3.4 (L) 06/10/2017 0704   CL 103 06/11/2017 0741   CL 106  06/10/2017 0704   CO2 25 06/11/2017 0741   CO2 28 06/10/2017 0704   BUN 7 06/11/2017 0741   BUN 7 06/10/2017 0704   BUN 14 02/17/2017 1204   BUN 14 12/09/2016 1114   CREATININE 0.72 06/11/2017 0741   CREATININE 0.75 06/10/2017 0704   CREATININE 0.96 12/23/2014 1214   CREATININE 1.19 03/08/2013 1146   GLUCOSE 97 06/11/2017 0741   GLUCOSE 96 06/10/2017 0704   CALCIUM 8.4 (L) 06/11/2017 0741   CALCIUM 8.5 (L) 06/10/2017 0704   AST 30 05/30/2017 1403   AST 29 03/12/2017 1317   ALT 6 (L) 05/30/2017 1403   ALT <5 (L) 03/12/2017 1317   ALKPHOS 37 (L) 05/30/2017 1403   ALKPHOS 33 (L) 03/12/2017 1317   BILITOT 0.7 05/30/2017 1403   BILITOT 0.7 03/12/2017 1317   BILITOT 0.3 02/17/2017 1204   BILITOT 0.3 12/09/2016 1114   PROT 6.8 05/30/2017 1403   PROT 6.8 03/12/2017 1317   PROT 6.7 02/17/2017 1204   PROT 6.6 12/09/2016 1114   ALBUMIN 3.7 05/30/2017 1403   ALBUMIN 3.6 03/12/2017 1317   ALBUMIN 4.2 02/17/2017 1204   ALBUMIN 3.9 12/09/2016 1114    Studies/Results: No results found.    [name] Pigman  [name] 06/11/2017  Patient ID: [name] [name], male   DOB: 08/25/1931, 83 y.o.   MRN: [id number]

## 2017-06-11 NOTE — Discharge Instructions (Addendum)
Information on my medicine - Coumadin   (Warfarin)  Why was Coumadin prescribed for you? Coumadin was prescribed for you because you have a blood clot or a medical condition that can cause an increased risk of forming blood clots. Blood clots can cause serious health problems by blocking the flow of blood to the heart, lung, or brain. Coumadin can prevent harmful blood clots from forming. As a reminder your indication for Coumadin is:   Pulmonary Embolism Treatment  What test will check on my response to Coumadin? While on Coumadin (warfarin) you will need to have an INR test regularly to ensure that your dose is keeping you in the desired range. The INR (international normalized ratio) number is calculated from the result of the laboratory test called prothrombin time (PT).      [facility], [place] [id number]  OPEN ABDOMINAL SURGERY: POST OP INSTRUCTIONS  Always review your discharge instruction sheet given to you by the facility where your surgery was performed.  IF YOU HAVE DISABILITY OR FAMILY LEAVE FORMS, YOU MUST BRING THEM TO THE OFFICE FOR PROCESSING.  PLEASE DO NOT GIVE THEM TO YOUR DOCTOR.  1. A prescription for pain medication may be given to you upon discharge.  Take your pain medication as prescribed, if needed.  If narcotic pain medicine is not needed, then you may take acetaminophen (Tylenol) or ibuprofen (Advil) as needed. 2. Take your usually prescribed medications unless otherwise directed. 3. If you need a refill on your pain medication, please contact your pharmacy. They will contact our office to request authorization.  Prescriptions will not be filled after 5pm or on week-ends. 4. You should follow a light diet the first few days after arrival home, such as soup and crackers, pudding, etc.unless your doctor has advised otherwise. A high-fiber, low fat diet can be resumed as tolerated.   Be sure to include lots of fluids daily. Most patients will  experience some swelling and bruising on the chest and neck area.  Ice packs will help.  Swelling and bruising can take several days to resolve 5. Most patients will experience some swelling and bruising in the area of the incision. Ice pack will help. Swelling and bruising can take several days to resolve..  6. It is common to experience some constipation if taking pain medication after surgery.  Increasing fluid intake and taking a stool softener will usually help or prevent this problem from occurring.  A mild laxative (Milk of Magnesia or Miralax) should be taken according to package directions if there are no bowel movements after 48 hours. 7.  You may have steri-strips (small skin tapes) in place directly over the incision.  These strips should be left on the skin for 7-10 days.  If your surgeon used skin glue on the incision, you may shower in 24 hours.  The glue will flake off over the next 2-3 weeks.  Any sutures or staples will be removed at the office during your follow-up visit. You may find that a light gauze bandage over your incision may keep your staples from being rubbed or pulled. You may shower and replace the bandage daily. 8. ACTIVITIES:  You may resume regular (light) daily activities beginning the next day--such as daily self-care, walking, climbing stairs--gradually increasing activities as tolerated.  You may have sexual intercourse when it is comfortable.  Refrain from any heavy lifting or straining until approved by your doctor. a. You may drive when you no  longer are taking prescription pain medication, you can comfortably wear a seatbelt, and you can safely maneuver your car and apply brakes b. Return to Work: ___________________________________ 64. You should see your doctor in the office for a follow-up appointment approximately two weeks after your surgery.  Make sure that you call for this appointment within a day or two after you arrive home to insure a convenient appointment  time. OTHER INSTRUCTIONS:  _____________________________________________________________ _____________________________________________________________  WHEN TO CALL YOUR DOCTOR: 1. Fever over 101.0 2. Inability to urinate 3. Nausea and/or vomiting 4. Extreme swelling or bruising 5. Continued bleeding from incision. 6. Increased pain, redness, or drainage from the incision. 7. Difficulty swallowing or breathing 8. Muscle cramping or spasms. 9. Numbness or tingling in hands or feet or around lips.  The clinic staff is available to answer your questions during regular business hours.  Please dont hesitate to call and ask to speak to one of the nurses if you have concerns.  For further questions, please visit [email or web address]   If an INR APPOINTMENT HAS NOT ALREADY BEEN MADE FOR YOU please schedule an appointment to have this lab work done by your health care provider within 7 days. Your INR goal is usually a number between:  2 to 3 or your provider may give you a more narrow range like 2-2.5.  Ask your health care provider during an office visit what your goal INR is.  What  do you need to  know  About  COUMADIN? Take Coumadin (warfarin) exactly as prescribed by your healthcare provider about the same time each day.  DO NOT stop taking without talking to the doctor who prescribed the medication.  Stopping without other blood clot prevention medication to take the place of Coumadin may increase your risk of developing a new clot or stroke.  Get refills before you run out.  What do you do if you miss a dose? If you miss a dose, take it as soon as you remember on the same day then continue your regularly scheduled regimen the next day.  Do not take two doses of Coumadin at the same time.  Important Safety Information A possible side effect of Coumadin (Warfarin) is an increased risk of bleeding. You should call your healthcare provider right away if you experience any of the  following: ? Bleeding from an injury or your nose that does not stop. ? Unusual colored urine (red or dark brown) or unusual colored stools (red or black). ? Unusual bruising for unknown reasons. ? A serious fall or if you hit your head (even if there is no bleeding).  Some foods or medicines interact with Coumadin (warfarin) and might alter your response to warfarin. To help avoid this: ? Eat a balanced diet, maintaining a consistent amount of Vitamin K. ? Notify your provider about major diet changes you plan to make. ? Avoid alcohol or limit your intake to 1 drink for women and 2 drinks for men per day. (1 drink is 5 oz. wine, 12 oz. beer, or 1.5 oz. liquor.)  Make sure that ANY health care provider who prescribes medication for you knows that you are taking Coumadin (warfarin).  Also make sure the healthcare provider who is monitoring your Coumadin knows when you have started a new medication including herbals and non-prescription products.  Coumadin (Warfarin)  Major Drug Interactions  Increased Warfarin Effect Decreased Warfarin Effect  Alcohol (large quantities) Antibiotics (esp. Septra/Bactrim, Flagyl, Cipro) Amiodarone (Cordarone) Aspirin (ASA) Cimetidine (Tagamet) Megestrol (Megace)  NSAIDs (ibuprofen, naproxen, etc.) Piroxicam (Feldene) Propafenone (Rythmol SR) Propranolol (Inderal) Isoniazid (INH) Posaconazole (Noxafil) Barbiturates (Phenobarbital) Carbamazepine (Tegretol) Chlordiazepoxide (Librium) Cholestyramine (Questran) Griseofulvin Oral Contraceptives Rifampin Sucralfate (Carafate) Vitamin K   Coumadin (Warfarin) Major Herbal Interactions  Increased Warfarin Effect Decreased Warfarin Effect  Garlic Ginseng Ginkgo biloba Coenzyme Q10 Green tea St. Johns wort    Coumadin (Warfarin) FOOD Interactions  Eat a consistent number of servings per week of foods HIGH in Vitamin K (1 serving =  cup)  Collards (cooked, or boiled & drained) Kale (cooked, or  boiled & drained) Mustard greens (cooked, or boiled & drained) Parsley *serving size only =  cup Spinach (cooked, or boiled & drained) Swiss chard (cooked, or boiled & drained) Turnip greens (cooked, or boiled & drained)  Eat a consistent number of servings per week of foods MEDIUM-HIGH in Vitamin K (1 serving = 1 cup)  Asparagus (cooked, or boiled & drained) Broccoli (cooked, boiled & drained, or raw & chopped) Brussel sprouts (cooked, or boiled & drained) *serving size only =  cup Lettuce, raw (green leaf, endive, romaine) Spinach, raw Turnip greens, raw & chopped   These websites have more information on Coumadin (warfarin):  FailFactory.se; VeganReport.com.au;

## 2017-06-11 NOTE — Progress Notes (Signed)
  Date: 06/11/2017  Patient name: [name] [name]  Medical record number: [id number]  Date of birth: 06/20/1931   I have seen and evaluated this patient and I have discussed the plan of care with the house staff. Please see Dr. Latina Craver note for complete details. I concur with her findings.  Sid Falcon, MD 06/11/2017, 6:04 PM

## 2017-06-11 NOTE — Progress Notes (Signed)
[place] for Coumadin Indication: history of PE  Allergies  Allergen Reactions  . Aricept [Donepezil Hcl] Other (See Comments)    Symptomatic Bradycardia.  . Other Other (See Comments)    Shrimp gives him gout  . Shellfish-Derived Products Other (See Comments)    Causes a flare up with Gout   Patient Measurements: Height: 5\' 6"  (167.6 cm) Weight: 149 lb (67.6 kg) IBW/kg (Calculated) : 63.8  Heparin dosing wt: 67.6 kg  Assessment: 83 y.o. male with h/o PE. On heparin and Coumadin bridge for hx of PE. Coumadin restarted s/p surgery on 9/20. Last heparin level therapeutic at 0.44. INR jumped from 1.27 to 2.12 and now 2.44 today. Hgb 10.2, plts low. PTA warfarin: 5 mg Mon/Wed, 2.5 mg all other days (goal 2-3)  Goal of Therapy:  Heparin level 0.3-0.7 units/ml Monitor platelets by anticoagulation protocol: Yes   Plan:  Give Coumadin 2.5mg  PO x 1 tonight  Monitor daily INR, CBC, s/s of bleed  [name], PharmD, BCPS Clinical Pharmacist Pager [phone] 06/11/2017 9:08 AM

## 2017-06-12 LAB — BASIC METABOLIC PANEL
ANION GAP: 7 (ref 5–15)
BUN: 6 mg/dL (ref 6–20)
CALCIUM: 8.6 mg/dL — AB (ref 8.9–10.3)
CO2: 29 mmol/L (ref 22–32)
Chloride: 101 mmol/L (ref 101–111)
Creatinine, Ser: 0.65 mg/dL (ref 0.61–1.24)
Glucose, Bld: 92 mg/dL (ref 65–99)
POTASSIUM: 3.4 mmol/L — AB (ref 3.5–5.1)
Sodium: 137 mmol/L (ref 135–145)

## 2017-06-12 LAB — CBC
HEMATOCRIT: 34.9 % — AB (ref 39.0–52.0)
Hemoglobin: 11.2 g/dL — ABNORMAL LOW (ref 13.0–17.0)
MCH: 30.2 pg (ref 26.0–34.0)
MCHC: 32.1 g/dL (ref 30.0–36.0)
MCV: 94.1 fL (ref 78.0–100.0)
PLATELETS: 154 10*3/uL (ref 150–400)
RBC: 3.71 MIL/uL — ABNORMAL LOW (ref 4.22–5.81)
RDW: 14.9 % (ref 11.5–15.5)
WBC: 4.6 10*3/uL (ref 4.0–10.5)

## 2017-06-12 LAB — PROTIME-INR
INR: 2.28
Prothrombin Time: 25 seconds — ABNORMAL HIGH (ref 11.4–15.2)

## 2017-06-12 LAB — HEMOGLOBIN AND HEMATOCRIT, BLOOD
HCT: 35 % — ABNORMAL LOW (ref 39.0–52.0)
Hemoglobin: 11.5 g/dL — ABNORMAL LOW (ref 13.0–17.0)

## 2017-06-12 MED ORDER — AMLODIPINE BESYLATE 5 MG PO TABS
5.0000 mg | ORAL_TABLET | Freq: Every day | ORAL | Status: DC
  Administered 2017-06-12 – 2017-06-14 (×3): 5 mg via ORAL
  Filled 2017-06-12 (×3): qty 1

## 2017-06-12 MED ORDER — POTASSIUM CHLORIDE 10 MEQ/100ML IV SOLN
10.0000 meq | INTRAVENOUS | Status: AC
  Administered 2017-06-12 (×4): 10 meq via INTRAVENOUS
  Filled 2017-06-12 (×4): qty 100

## 2017-06-12 MED ORDER — MAGNESIUM SULFATE 2 GM/50ML IV SOLN
2.0000 g | Freq: Once | INTRAVENOUS | Status: AC
  Administered 2017-06-12: 2 g via INTRAVENOUS
  Filled 2017-06-12: qty 50

## 2017-06-12 MED ORDER — WARFARIN SODIUM 5 MG PO TABS
2.5000 mg | ORAL_TABLET | Freq: Once | ORAL | Status: AC
  Administered 2017-06-12: 2.5 mg via ORAL
  Filled 2017-06-12 (×2): qty 1

## 2017-06-12 NOTE — Progress Notes (Signed)
General Surgery [facility]  Assessment & Plan: Recurrent sigmoid volvulus POD#4 S/P SIGMOID COLECTOMY 06/08/17, Dr. Georganna Skeans - GI function returning, active BS's - continue full liquid diet, advance to regular as tolerated             - OOB to chair  Case manager and SW to address disposition - family apparently desires Rehab/SNF.        Earnstine Regal, MD, [facility]       Office: [phone]    Chief Complaint: Sigmoid volvulus, recurrent  Subjective: Patient in bed, more responsive this AM.  No complaints.  Limited po intake.  Objective: Vital signs in last 24 hours: Temp:  [97.7 F (36.5 C)-98.8 F (37.1 C)] 97.7 F (36.5 C) (09/23 0451) Pulse Rate:  [68-70] 69 (09/23 0612) Resp:  [17] 17 (09/23 0451) BP: (159-160)/(96-105) 160/105 (09/23 0612) SpO2:  [95 %-97 %] 97 % (09/23 0451) Last BM Date: 06/11/17  Intake/Output from previous day: 09/22 0701 - 09/23 0700 In: 1420 [I.V.:1420] Out: 1801 [Urine:1800; Stool:1] Intake/Output this shift: Total I/O In: -  Out: 450 [Urine:450]  Physical Exam: HEENT - sclerae clear, mucous membranes moist Neck - soft Chest - clear bilaterally Cor - RRR Abdomen - soft, dressing with small serous drainage in honeycomb; active BS present  Lab Results:   Recent Labs  06/11/17 0741 06/12/17 0451  WBC 4.7 4.6  HGB 10.2* 11.2*  HCT 31.7* 34.9*  PLT 149* 154   BMET  Recent Labs  06/11/17 0741 06/12/17 0451  NA 136 137  K 3.7 3.4*  CL 103 101  CO2 25 29  GLUCOSE 97 92  BUN 7 6  CREATININE 0.72 0.65  CALCIUM 8.4* 8.6*   PT/INR  Recent Labs  06/11/17 0741 06/12/17 0451  LABPROT 26.1* 25.0*  INR 2.42 2.28   Comprehensive Metabolic Panel:    Component Value Date/Time   NA 137 06/12/2017 0451   NA 136 06/11/2017 0741   NA 145 (H) 02/17/2017 1204   NA 144 12/09/2016 1114   K 3.4 (L) 06/12/2017 0451   K 3.7 06/11/2017  0741   CL 101 06/12/2017 0451   CL 103 06/11/2017 0741   CO2 29 06/12/2017 0451   CO2 25 06/11/2017 0741   BUN 6 06/12/2017 0451   BUN 7 06/11/2017 0741   BUN 14 02/17/2017 1204   BUN 14 12/09/2016 1114   CREATININE 0.65 06/12/2017 0451   CREATININE 0.72 06/11/2017 0741   CREATININE 0.96 12/23/2014 1214   CREATININE 1.19 03/08/2013 1146   GLUCOSE 92 06/12/2017 0451   GLUCOSE 97 06/11/2017 0741   CALCIUM 8.6 (L) 06/12/2017 0451   CALCIUM 8.4 (L) 06/11/2017 0741   AST 30 05/30/2017 1403   AST 29 03/12/2017 1317   ALT 6 (L) 05/30/2017 1403   ALT <5 (L) 03/12/2017 1317   ALKPHOS 37 (L) 05/30/2017 1403   ALKPHOS 33 (L) 03/12/2017 1317   BILITOT 0.7 05/30/2017 1403   BILITOT 0.7 03/12/2017 1317   BILITOT 0.3 02/17/2017 1204   BILITOT 0.3 12/09/2016 1114   PROT 6.8 05/30/2017 1403   PROT 6.8 03/12/2017 1317   PROT 6.7 02/17/2017 1204   PROT 6.6 12/09/2016 1114   ALBUMIN 3.7 05/30/2017 1403   ALBUMIN 3.6 03/12/2017 1317   ALBUMIN 4.2 02/17/2017 1204   ALBUMIN 3.9 12/09/2016 1114    Studies/Results: No results found.    Khush Pasion Jerilynn Mages 06/12/2017  Patient ID: Orlandis Sanden, male   DOB: 1930-11-13, 83 y.o.   MRN: [id number]

## 2017-06-12 NOTE — Progress Notes (Addendum)
Paged to patient's bedside due to bright red blood per rectum. Examination of the blood stained cloth, only about 100-150cc. No increased abdominal pain. Hemodynamically stable. PE unremarkable. INR currently 2.2. Notified surgery and checking Hgb. Surgery aware of the situation, believe it is due to the stable line. Will continue to monitor.

## 2017-06-12 NOTE — Progress Notes (Signed)
  Date: 06/12/2017  Patient name: [name] [name]  Medical record number: [id number]  Date of birth: 09-12-1931   I have seen and evaluated this patient and I have discussed the plan of care with the house staff. Please see Dr. Latina Craver note for complete details. I concur with her findings with the following additions/corrections:   Patient not quite ready for discharge.  Needs to be having regular BM and tolerating regular diet per discussion with Dr. Harlow Asa of surgery this AM.  Will monitor at least 1-2 more days.  Waiting for SNF placement for rehab as well.   Sid Falcon, MD 06/12/2017, 12:46 PM

## 2017-06-12 NOTE — Progress Notes (Signed)
[place] for Coumadin Indication: history of PE  Allergies  Allergen Reactions  . Aricept [Donepezil Hcl] Other (See Comments)    Symptomatic Bradycardia.  . Other Other (See Comments)    Shrimp gives him gout  . Shellfish-Derived Products Other (See Comments)    Causes a flare up with Gout   Patient Measurements: Height: 5\' 6"  (167.6 cm) Weight: 149 lb (67.6 kg) IBW/kg (Calculated) : 63.8  Heparin dosing wt: 67.6 kg  Assessment: 83 y.o. male with h/o PE. S/p heparin and Coumadin bridge for hx of PE. Coumadin restarted s/p surgery on 9/20. INR jumped up quickly but now somewhat stable at 2.28. Hgb 11.2, plts 154. PTA warfarin: 5 mg Mon/Wed, 2.5 mg all other days (goal 2-3)  Goal of Therapy:  Heparin level 0.3-0.7 units/ml Monitor platelets by anticoagulation protocol: Yes   Plan:  Give Coumadin 2.5mg  PO x 1 tonight  Monitor daily INR, CBC, s/s of bleed  [name], PharmD, BCPS Clinical Pharmacist Pager [phone] 06/12/2017 8:11 AM

## 2017-06-12 NOTE — Progress Notes (Signed)
Bright red blood noted on pt's pad appearing to come from rectum.  No obvious source and pt has no complaints of pain.  MD notified and MD stated they would be up to assess pt.  Will continue to monitor.  [name] [name]

## 2017-06-12 NOTE — Progress Notes (Signed)
   Subjective:  Patient was feeling better and since this morning. No family in the room. He was little more alert and oriented. He denies any pain. He is having bowel movements and able to tolerate liquid diet well.  Objective:  Vital signs in last 24 hours: Vitals:   06/11/17 0441 06/11/17 2100 06/12/17 0451 06/12/17 0612  BP: (!) 161/90 (!) 159/96  (!) 160/105  Pulse: 69 70 68 69  Resp: 17 17 17    Temp: 98.9 F (37.2 C) 98.8 F (37.1 C) 97.7 F (36.5 C)   TempSrc: Axillary Oral    SpO2: 98% 95% 97%   Weight:      Height:       Gen. Well-developed elderly man, in no acute distress. Chest. Clear bilaterally on anterior auscultation. CV. Regular rate and rhythm. Abdomen.  Soft, surgical side with honeycomb dressing without any sign of more bleeding or discharge, bowel sounds positive. Extremities. No edema, no cyanosis, pulses intact and symmetrical.   Assessment/Plan:  Sigmoid Volvulus s/p Sigmoid colectomy on 06/08/17. Postoperative day 4: patient appears more alert today. He was having good bowel sounds and able to tolerate liquid diet very well. His Ringer lactate was stopped. Family has not decided about his disposition yet. -Advanced diet as tolerated. -Continue pain control with scheduled Tylenol and when necessary tramadol. -He is ready to be discharged, family has to decide SNf/home with 24-hour supervision.  Hypokalemia: His potassium was 3.4 today. We will replace potassium and magnesium. -Recheck potassium and magnesium tomorrow morning.  History of PE. He was restarted on Coumadin, INR stable around 2.2.   Parkinson's Disease, Dementia  Pt with a history of advanced dementia and Parkinson's disease, low functional status and minimally interactive at baseline.  - Continue home memantine, Sinemet - Continue Seroquel PRN   Dispo: Anticipated discharge in approximately 0-1 day(s).   Lorella Nimrod, MD 06/12/2017, 11:07 AM Pager: [phone]

## 2017-06-12 NOTE — Progress Notes (Signed)
Notified MD Chundi via text page that H&H has resulted. Hgb 11.5 and Hct 35.0. Awaiting return call or further orders.

## 2017-06-13 LAB — BASIC METABOLIC PANEL
ANION GAP: 7 (ref 5–15)
BUN: 7 mg/dL (ref 6–20)
CHLORIDE: 101 mmol/L (ref 101–111)
CO2: 29 mmol/L (ref 22–32)
Calcium: 8.6 mg/dL — ABNORMAL LOW (ref 8.9–10.3)
Creatinine, Ser: 0.63 mg/dL (ref 0.61–1.24)
GFR calc Af Amer: >60 mL/min (ref >60)
GFR calc non Af Amer: >60 mL/min (ref >60)
Glucose, Bld: 107 mg/dL — ABNORMAL HIGH (ref 65–99)
Potassium: 3.6 mmol/L (ref 3.5–5.1)
Sodium: 137 mmol/L (ref 135–145)

## 2017-06-13 LAB — CBC
HEMATOCRIT: 34.1 % — AB (ref 39.0–52.0)
HEMOGLOBIN: 11.2 g/dL — AB (ref 13.0–17.0)
MCH: 30.6 pg (ref 26.0–34.0)
MCHC: 32.8 g/dL (ref 30.0–36.0)
MCV: 93.2 fL (ref 78.0–100.0)
Platelets: 125 10*3/uL — ABNORMAL LOW (ref 150–400)
RBC: 3.66 MIL/uL — ABNORMAL LOW (ref 4.22–5.81)
RDW: 14.7 % (ref 11.5–15.5)
WBC: 4.6 10*3/uL (ref 4.0–10.5)

## 2017-06-13 LAB — PROTIME-INR
INR: 2.19
Prothrombin Time: 24.2 seconds — ABNORMAL HIGH (ref 11.4–15.2)

## 2017-06-13 LAB — MAGNESIUM: Magnesium: 1.8 mg/dL (ref 1.7–2.4)

## 2017-06-13 LAB — HEMOGLOBIN AND HEMATOCRIT, BLOOD
HEMATOCRIT: 35.7 % — AB (ref 39.0–52.0)
HEMOGLOBIN: 11.6 g/dL — AB (ref 13.0–17.0)

## 2017-06-13 MED ORDER — WARFARIN SODIUM 5 MG PO TABS
5.0000 mg | ORAL_TABLET | Freq: Once | ORAL | Status: AC
  Administered 2017-06-13: 5 mg via ORAL
  Filled 2017-06-13: qty 1

## 2017-06-13 MED ORDER — MAGNESIUM SULFATE 2 GM/50ML IV SOLN
2.0000 g | Freq: Once | INTRAVENOUS | Status: AC
  Administered 2017-06-13: 2 g via INTRAVENOUS
  Filled 2017-06-13: qty 50

## 2017-06-13 MED ORDER — POTASSIUM CHLORIDE 10 MEQ/100ML IV SOLN
10.0000 meq | INTRAVENOUS | Status: AC
  Administered 2017-06-13 (×4): 10 meq via INTRAVENOUS
  Filled 2017-06-13 (×4): qty 100

## 2017-06-13 NOTE — Care Management Note (Signed)
Case Management Note  Patient Details  Name: [name] [name] MRN: [id number] Date of Birth: [birth year]  Subjective/Objective:                    Action/Plan:  NCM called daughter [name] [name] [phone] . She wants to take her father home to try home with home health through [facility] , however she may be interested in SNF if they cannot manage.   She wants hospital bed through [facility]. Explained [facility] has been calling her cell to arrange delivery. Delivery needs to be set up ASAP. Ms Janace Hoard voiced understanding.  Home address is [address] , [place] , her brother will transport patient home. Explained discharge is today . She stated a male MD called her and said discharge is tomorrow. Dr Trilby Drummer aware and will talk with Ms Janace Hoard. [facility] [facility] calling again  to arrange delivery. Expected Discharge Date:  06/05/17               Expected Discharge Plan:  [place]  In-House Referral:     Discharge planning Services  CM Consult  Post Acute Care Choice:    Choice offered to:  Adult Children  DME Arranged:  Hospital bed, Other see comment DME Agency:  [place]:  RN, PT, OT, Nurse's Aide, Social Work [employer] Agency:  [place]  Status of Service:  In process, will continue to follow  If discussed at Long Length of Stay Meetings, dates discussed:    Additional Comments:  Marilu Favre, RN 06/13/2017, 2:13 PM

## 2017-06-13 NOTE — Care Management Note (Signed)
Case Management Note  Patient Details  Name: [name] [name] MRN: [id number] Date of Birth: 1931-04-01  Subjective/Objective:                    Action/Plan: Left voice mail for daughter Gweneth Fritter [phone].  Spoke with Dr Trilby Drummer discharge is for today and family needs hospital bed and hoyer lift. Same ordered in [facility] , after MD signs [facility] can process . Will need home health orders and face to face.  Expected Discharge Date:  06/05/17               Expected Discharge Plan:  [place]  In-House Referral:     Discharge planning Services  CM Consult  Post Acute Care Choice:    Choice offered to:     DME Arranged:  Hospital bed, Other see comment DME Agency:  [place]:    [facility] Agency:     Status of Service:  In process, will continue to follow  If discussed at Long Length of Stay Meetings, dates discussed:    Additional Comments:  Marilu Favre, RN 06/13/2017, 11:32 AM

## 2017-06-13 NOTE — Progress Notes (Signed)
[place] for Coumadin Indication: history of PE  Allergies  Allergen Reactions  . Aricept [Donepezil Hcl] Other (See Comments)    Symptomatic Bradycardia.  . Other Other (See Comments)    Shrimp gives him gout  . Shellfish-Derived Products Other (See Comments)    Causes a flare up with Gout   Patient Measurements: Height: 5\' 6"  (167.6 cm) Weight: 149 lb (67.6 kg) IBW/kg (Calculated) : 63.8  Heparin dosing wt: 67.6 kg  Assessment: 83 y.o. male with h/o PE. S/p heparin and Coumadin bridge for hx of PE. Coumadin restarted s/p surgery on 9/20. INR jumped up quickly but now somewhat stable at 2.19. Hgb low but stable, plts down 125.  PTA warfarin: 5 mg Mon/Wed, 2.5 mg all other days (goal 2-3)  Goal of Therapy:  INR 2-3 Monitor platelets by anticoagulation protocol: Yes   Plan:  Give Coumadin 5mg  PO x 1 tonight  Monitor daily INR, CBC, s/s of bleed  Elicia Lamp, PharmD, BCPS Clinical Pharmacist Rx Phone # for today: [phone] After 3:30PM, please call Main Rx: [phone] 06/13/2017 9:57 AM

## 2017-06-13 NOTE — Care Management (Addendum)
    Durable Medical Equipment        Start     Ordered   06/13/17 1125  For home use only DME Hospital bed  Once    Comments:  Discharge today Gweneth Fritter [phone]  Question Answer Comment  Patient has (list medical condition): SIGMOID COLECTOMY   The above medical condition requires: Patient requires the ability to reposition immediately   Head must be elevated greater than: 45 degrees   Bed type Semi-electric   Hoyer Lift Yes      06/13/17 1128      ADDENDUM: Signed for Magdalen Spatz, Case Management  Pearson Grippe, DO IM PGY-1 Pager: [phone]

## 2017-06-13 NOTE — Care Management Important Message (Signed)
Important Message  Patient Details  Name: [name] [name] MRN: [id number] Date of Birth: Dec 24, 1930   Medicare Important Message Given:  Yes    Ronna Herskowitz 06/13/2017, 2:25 PM

## 2017-06-13 NOTE — Progress Notes (Signed)
Subjective Bloody BM - mixed dark red/maroon brown. On warfarin, INR 2.2. No complaints per daughter this morning. Tolerating liquids without n/v.  Objective: Vital signs in last 24 hours: Temp:  [98.1 F (36.7 C)-99.7 F (37.6 C)] 98.1 F (36.7 C) (09/24 0525) Pulse Rate:  [67-90] 67 (09/24 0525) Resp:  [17-18] 17 (09/24 0525) BP: (142-184)/(88-114) 162/88 (09/24 0525) SpO2:  [95 %-99 %] 99 % (09/24 0525) Last BM Date: 06/12/17  Intake/Output from previous day: 09/23 0701 - 09/24 0700 In: -  Out: 2700 [Urine:2700] Intake/Output this shift: No intake/output data recorded.  Gen: NAD, comfortable, sleeping CV: RRR Pulm: Normal work of breathing Abd: Incision c/d/i without erthema; abd soft, ND Ext: SCDs in place  Lab Results: CBC   Recent Labs  06/12/17 0451 06/12/17 1853 06/13/17 0233  WBC 4.6  --  4.6  HGB 11.2* 11.5* 11.2*  HCT 34.9* 35.0* 34.1*  PLT 154  --  125*   BMET  Recent Labs  06/12/17 0451 06/13/17 0233  NA 137 137  K 3.4* 3.6  CL 101 101  CO2 29 29  GLUCOSE 92 107*  BUN 6 7  CREATININE 0.65 0.63  CALCIUM 8.6* 8.6*   PT/INR  Recent Labs  06/12/17 0451 06/13/17 0233  LABPROT 25.0* 24.2*  INR 2.28 2.19   ABG No results for input(s): PHART, HCO3 in the last 72 hours.  Invalid input(s): PCO2, PO2  Studies/Results:  Anti-infectives: Anti-infectives    Start     Dose/Rate Route Frequency Ordered Stop   06/08/17 1415  cefoTEtan (CEFOTAN) 2 g in dextrose 5 % 50 mL IVPB     2 g 100 mL/hr over 30 Minutes Intravenous To Surgery 06/08/17 1401 06/08/17 1421       Assessment/Plan: Patient Active Problem List   Diagnosis Date Noted  . Generalized abdominal pain   . Palliative care encounter   . Volvulus of colon ([place]) 05/31/2017  . Aortic atherosclerosis ([place]) 05/31/2017  . Bad odor of urine 01/17/2017  . Loss of weight 09/23/2016  . Abdominal distension   . Long term current use of anticoagulant   . Right bundle branch block    . Hypokalemia 08/02/2016  . Diverticulosis 07/15/2016  . Pyelonephritis 07/09/2016  . Statin myopathy 03/15/2016  . Parkinsonian syndrome ([place]) 08/11/2015  . Normocytic anemia, not due to blood loss 04/16/2015  . Chronic systolic heart failure ([place]) 04/16/2015  . Falls frequently 04/16/2015  . Onychomycosis 04/15/2015  . CAD (coronary artery disease) 01/16/2015  . 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 Syncope 12/07/2014  . Healthcare maintenance 10/10/2014  . Lumbosacral spondylosis without myelopathy 09/24/2013  . Altered mental status 02/20/2013  . Bradycardia 02/20/2013  . Generalized ischemic cerebrovascular disease 02/05/2013  . Depression 01/25/2013  . Gross hematuria 01/19/2013  . Insomnia 01/20/2012  . AKI (acute kidney injury) ([place]) 11/08/2011  . Alzheimer's dementia 08/17/2011  . History of pulmonary embolism 09/18/2010  . Encounter for long-term use of antiplatelets/antithrombotics 09/18/2010  . DJD (degenerative joint disease) 08/06/2010  . ADENOCARCINOMA, PROSTATE, HX OF 08/06/2010  . HLD (hyperlipidemia) 10/15/2009  . Essential hypertension, benign 10/15/2009  . GERD 10/15/2009   Recurrent sigmoid volvulus POD#5S/P SIGMOID COLECTOMY 06/08/17, Dr. Georganna Skeans - GI function returning, active BS's - Advance to regular as tolerated - OOB to chair  Case manager and SW to address disposition - family apparently desires Rehab/SNF.   LOS: 13 days   Ileana Roup, [facility]

## 2017-06-13 NOTE — Progress Notes (Signed)
  Date: 06/13/2017  Patient name: [name] [name]  Medical record number: [id number]  Date of birth: 04-21-1931   I have seen and evaluated this patient and I have discussed the plan of care with the house staff. Please see Dr. Tally Joe note for complete details. I concur with his findings with the following additions/corrections:   If able to be arranged, he can be discharged home today.  Family requesting hospital bed which has been ordered.   Sid Falcon, MD 06/13/2017, 1:34 PM

## 2017-06-13 NOTE — Progress Notes (Signed)
Physical Therapy Treatment Patient Details Name: [name] [name] MRN: [id number] DOB: 17-Oct-1930 Today's Date: 06/13/2017    History of Present Illness [name] [name] is an 84 y.o m with hx of htn, parkinson's disease, prior sigmoid volvulus which was corrected by flexible sigmoidoscopy x2, prostate cancer, dementia, previous pulmonary embolus on coumadin, and CAD who presented for abdominal distension and decreased appetite.  Underwent sigmoidoscopy showing recurrent sigmoid volvulus.. Pt was to D/C when volvulus reoccured and now s/p sigmoid colectomy 06/08/17.    PT Comments    Pt maintaining eyes closed throughout session despite assisting with rolling, sitting balance and answering questions at times. No family present this session. Pt noted to have oozing from lower portion of abdominal wound as well as bloody stool present on arrival with pericare performed and RN made aware. Pt continues to require max +2 assist and will need lift for return home. Will continue to follow.    Follow Up Recommendations  Supervision/Assistance - 24 hour;Home health PT     Equipment Recommendations  Other (comment) (hoyer)    Recommendations for Other Services       Precautions / Restrictions Precautions Precautions: Fall    Mobility  Bed Mobility Overal bed mobility: Needs Assistance Bed Mobility: Rolling;Sidelying to Sit Rolling: Mod assist Sidelying to sit: Mod assist;+2 for physical assistance       General bed mobility comments: rolling for pericare with pt able to grasp and hold rail with rolling bil with assist and cues to release grasp. Assist to move legs off of bed and elevate trunk  Transfers Overall transfer level: Needs assistance   Transfers: Sit to/from Stand;Squat Pivot Transfers Sit to Stand: Max assist;+2 physical assistance;From elevated surface Stand pivot transfers: Max assist;+2 physical assistance       General transfer comment: stood x 2 from EOB lifting with pad  under pt, both knees blocked, cues for sequence. Semi stand from chair with max assist to clear pad  Ambulation/Gait             General Gait Details: unable   Stairs            Wheelchair Mobility    Modified Rankin (Stroke Patients Only)       Balance Overall balance assessment: Needs assistance   Sitting balance-Leahy Scale: Fair       Standing balance-Leahy Scale: Zero                              Cognition Arousal/Alertness: Lethargic Behavior During Therapy: Flat affect Overall Cognitive Status: Impaired/Different from baseline                                 General Comments: pt maintaining eyes closed throughout session. Did reply to questions verbally x 3 during session      Exercises      General Comments        Pertinent Vitals/Pain Pain Assessment: No/denies pain (PAINAD=0)    Home Living                      Prior Function            PT Goals (current goals can now be found in the care plan section) Progress towards PT goals: Progressing toward goals (very limited)    Frequency    Min 3X/week      PT  Plan Current plan remains appropriate    Co-evaluation              AM-PAC PT "6 Clicks" Daily Activity  Outcome Measure  Difficulty turning over in bed (including adjusting bedclothes, sheets and blankets)?: Unable Difficulty moving from lying on back to sitting on the side of the bed? : Unable Difficulty sitting down on and standing up from a chair with arms (e.g., wheelchair, bedside commode, etc,.)?: Unable Help needed moving to and from a bed to chair (including a wheelchair)?: Total Help needed walking in hospital room?: Total Help needed climbing 3-5 steps with a railing? : Total 6 Click Score: 6    End of Session Equipment Utilized During Treatment: Gait belt Activity Tolerance: Patient tolerated treatment well Patient left: in chair;with chair alarm set;with call  bell/phone within [name] Nurse Communication: Mobility status;Other (comment) (+2 pivot or lift) PT Visit Diagnosis: Other abnormalities of gait and mobility (R26.89);Other symptoms and signs involving the nervous system (R29.898);Muscle weakness (generalized) (M62.81)     Time: 8127-5170 PT Time Calculation (min) (ACUTE ONLY): 20 min  Charges:  $Therapeutic Activity: 8-22 mins                    G Codes:       [name] [name], PT [phone]   [name] [name] 06/13/2017, 1:13 PM

## 2017-06-13 NOTE — Progress Notes (Signed)
   Subjective:  Patient was resting comfortably in his bed this morning. No family in the room. He was drowsy and only answered some questions. He had an episode of BRBPR yesterday and was evaluated by the on call team who contacted surgery. This is expected to be due to the staple line. He is having bowel movements and able to tolerate liquid diet.  Objective:  Vital signs in last 24 hours: Vitals:   06/12/17 2056 06/12/17 2125 06/12/17 2130 06/13/17 0525  BP: (!) 154/112 (!) 152/98 (!) 142/92 (!) 162/88  Pulse: 82   67  Resp:    17  Temp:    98.1 F (36.7 C)  TempSrc:    Axillary  SpO2:    99%  Weight:      Height:       Gen. Well-developed elderly man, in no acute distress. Chest. Clear bilaterally on anterior auscultation. CV. Regular rate and rhythm. Abdomen.  Soft, surgical side with dressing off. Trace serosanguinous at inferior edge of stable line. Bowl sounds positive. Extremities. No edema, no cyanosis, pulses intact and symmetrical.   Assessment/Plan:  Sigmoid Volvulus s/p Sigmoid colectomy on 06/08/17. PostOp day 5: Patient drowsy early this morning, has been morn alert later in the day. Good bowel sounds and able to tolerated liquid diet well. Patient is to be discharge to home. - Advanced diet as tolerated. - Continue pain control with scheduled Tylenol and when necessary tramadol.  Hypokalemia: His potassium was 3.6 today. - IV magnesium 8.56mEq x1 - IV Potassium 10mEq x4 - Will discharge on potassium supplement  History of PE. He was restarted on Coumadin, INR stable 2.19.   Parkinson's Disease, Dementia  Pt with a history of advanced dementia and Parkinson's disease, low functional status and minimally interactive at baseline.  - Continue home memantine, Sinemet - Continue Seroquel PRN   Dispo: Anticipated discharge in approximately 0-1 day(s).   Neva Seat, MD 06/13/2017, 8:26 AM Pager: [phone]

## 2017-06-14 ENCOUNTER — Ambulatory Visit: Payer: Medicare HMO | Admitting: Podiatry

## 2017-06-14 ENCOUNTER — Emergency Department (HOSPITAL_COMMUNITY)
Admission: EM | Admit: 2017-06-14 | Discharge: 2017-06-15 | Disposition: A | Discharge status: Home / Self Care | Payer: Medicare HMO | Attending: Emergency Medicine | Admitting: Emergency Medicine

## 2017-06-14 ENCOUNTER — Emergency Department (HOSPITAL_COMMUNITY): Payer: Medicare HMO

## 2017-06-14 ENCOUNTER — Encounter (HOSPITAL_COMMUNITY): Payer: Self-pay

## 2017-06-14 DIAGNOSIS — D649 Anemia, unspecified: Secondary | ICD-10-CM | POA: Diagnosis not present

## 2017-06-14 DIAGNOSIS — F329 Major depressive disorder, single episode, unspecified: Secondary | ICD-10-CM | POA: Diagnosis not present

## 2017-06-14 DIAGNOSIS — Z7901 Long term (current) use of anticoagulants: Secondary | ICD-10-CM | POA: Insufficient documentation

## 2017-06-14 DIAGNOSIS — K625 Hemorrhage of anus and rectum: Secondary | ICD-10-CM | POA: Diagnosis not present

## 2017-06-14 DIAGNOSIS — I110 Hypertensive heart disease with heart failure: Secondary | ICD-10-CM | POA: Insufficient documentation

## 2017-06-14 DIAGNOSIS — G20 Parkinson's disease: Secondary | ICD-10-CM | POA: Insufficient documentation

## 2017-06-14 DIAGNOSIS — F0390 Unspecified dementia without behavioral disturbance: Secondary | ICD-10-CM | POA: Diagnosis not present

## 2017-06-14 DIAGNOSIS — I5022 Chronic systolic (congestive) heart failure: Secondary | ICD-10-CM | POA: Insufficient documentation

## 2017-06-14 DIAGNOSIS — Z8546 Personal history of malignant neoplasm of prostate: Secondary | ICD-10-CM | POA: Insufficient documentation

## 2017-06-14 DIAGNOSIS — R140 Abdominal distension (gaseous): Secondary | ICD-10-CM | POA: Insufficient documentation

## 2017-06-14 DIAGNOSIS — R9431 Abnormal electrocardiogram [ECG] [EKG]: Secondary | ICD-10-CM | POA: Diagnosis not present

## 2017-06-14 DIAGNOSIS — I2510 Atherosclerotic heart disease of native coronary artery without angina pectoris: Secondary | ICD-10-CM | POA: Diagnosis not present

## 2017-06-14 DIAGNOSIS — I252 Old myocardial infarction: Secondary | ICD-10-CM | POA: Insufficient documentation

## 2017-06-14 DIAGNOSIS — F419 Anxiety disorder, unspecified: Secondary | ICD-10-CM | POA: Insufficient documentation

## 2017-06-14 DIAGNOSIS — Z79899 Other long term (current) drug therapy: Secondary | ICD-10-CM | POA: Insufficient documentation

## 2017-06-14 LAB — CBC WITH DIFFERENTIAL/PLATELET
Basophils Absolute: 0 10*3/uL (ref 0.0–0.1)
Basophils Relative: 0 %
EOS ABS: 0.1 10*3/uL (ref 0.0–0.7)
Eosinophils Relative: 1 %
HCT: 34.7 % — ABNORMAL LOW (ref 39.0–52.0)
Hemoglobin: 11.4 g/dL — ABNORMAL LOW (ref 13.0–17.0)
LYMPHS ABS: 0.7 10*3/uL (ref 0.7–4.0)
LYMPHS PCT: 15 %
MCH: 30.6 pg (ref 26.0–34.0)
MCHC: 32.9 g/dL (ref 30.0–36.0)
MCV: 93.3 fL (ref 78.0–100.0)
Monocytes Absolute: 0.4 10*3/uL (ref 0.1–1.0)
Monocytes Relative: 8 %
NEUTROS PCT: 76 %
Neutro Abs: 3.7 10*3/uL (ref 1.7–7.7)
Platelets: 113 10*3/uL — ABNORMAL LOW (ref 150–400)
RBC: 3.72 MIL/uL — AB (ref 4.22–5.81)
RDW: 15.3 % (ref 11.5–15.5)
WBC: 4.8 10*3/uL (ref 4.0–10.5)

## 2017-06-14 LAB — COMPREHENSIVE METABOLIC PANEL
ALK PHOS: 42 U/L (ref 38–126)
ALT: 6 U/L — ABNORMAL LOW (ref 17–63)
AST: 29 U/L (ref 15–41)
Albumin: 2.6 g/dL — ABNORMAL LOW (ref 3.5–5.0)
Anion gap: 7 (ref 5–15)
BUN: 13 mg/dL (ref 6–20)
CALCIUM: 8.6 mg/dL — AB (ref 8.9–10.3)
CO2: 26 mmol/L (ref 22–32)
CREATININE: 0.71 mg/dL (ref 0.61–1.24)
Chloride: 105 mmol/L (ref 101–111)
GFR calc non Af Amer: >60 mL/min (ref >60)
Glucose, Bld: 124 mg/dL — ABNORMAL HIGH (ref 65–99)
Potassium: 3.6 mmol/L (ref 3.5–5.1)
SODIUM: 138 mmol/L (ref 135–145)
Total Bilirubin: 0.9 mg/dL (ref 0.3–1.2)
Total Protein: 5.7 g/dL — ABNORMAL LOW (ref 6.5–8.1)

## 2017-06-14 LAB — CBC
HEMATOCRIT: 35.9 % — AB (ref 39.0–52.0)
HEMOGLOBIN: 11.9 g/dL — AB (ref 13.0–17.0)
MCH: 30.8 pg (ref 26.0–34.0)
MCHC: 33.1 g/dL (ref 30.0–36.0)
MCV: 93 fL (ref 78.0–100.0)
Platelets: 123 10*3/uL — ABNORMAL LOW (ref 150–400)
RBC: 3.86 MIL/uL — AB (ref 4.22–5.81)
RDW: 14.9 % (ref 11.5–15.5)
WBC: 5.4 10*3/uL (ref 4.0–10.5)

## 2017-06-14 LAB — TYPE AND SCREEN
ABO/RH(D): A POS
Antibody Screen: NEGATIVE

## 2017-06-14 LAB — PROTIME-INR
INR: 2.01
INR: 2.41
Prothrombin Time: 22.6 seconds — ABNORMAL HIGH (ref 11.4–15.2)
Prothrombin Time: 26.1 seconds — ABNORMAL HIGH (ref 11.4–15.2)

## 2017-06-14 MED ORDER — HYDRALAZINE HCL 20 MG/ML IJ SOLN
10.0000 mg | Freq: Once | INTRAMUSCULAR | Status: AC | PRN
Start: 2017-06-14 — End: 2017-06-14
  Administered 2017-06-14: 10 mg via INTRAVENOUS
  Filled 2017-06-14: qty 1

## 2017-06-14 MED ORDER — AMLODIPINE BESYLATE 5 MG PO TABS: 5.0000 mg | ORAL_TABLET | Freq: Every day | ORAL | 1 refills | Status: DC

## 2017-06-14 MED ORDER — WARFARIN SODIUM 5 MG PO TABS
5.0000 mg | ORAL_TABLET | Freq: Once | ORAL | Status: AC
  Administered 2017-06-14: 5 mg via ORAL
  Filled 2017-06-14: qty 1

## 2017-06-14 NOTE — Progress Notes (Signed)
   Subjective:  Patient was resting comfortably in his bed this morning. No family in the room. He was drowsy and minimally responsive to verbal stimulus. He appears comfortable and without pain.  Objective:  Vital signs in last 24 hours: Vitals:   06/13/17 2230 06/14/17 0500 06/14/17 0800 06/14/17 1021  BP: (!) 153/82 (!) 157/90  (!) 154/92  Pulse: 72 81 84   Resp: 16 18 16    Temp: 98.5 F (36.9 C) 97.9 F (36.6 C) 98 F (36.7 C)   TempSrc: Axillary Axillary Axillary   SpO2: 98% 98% 98%   Weight:      Height:       Gen. Well-developed elderly man, in no acute distress. Chest. Clear bilaterally on anterior auscultation. CV. Regular rate and rhythm. Abdomen.  Soft, surgical site with clean intact dressing. Bowl sounds positive. Extremities. No edema, no cyanosis, pulses intact and symmetrical.  Assessment/Plan:  Sigmoid Volvulus s/p Sigmoid colectomy on 06/08/17. PostOp day 6: Patient drowsy early this morning, has been more alert later in the day. Good bowel sounds and able to tolerated liquid diet well. Patient is to be discharge to home. - Advanced diet as tolerated. - Continue pain control with scheduled Tylenol and when necessary tramadol.  Hypokalemia: His potassium was 3.6 on 9/25. - Will discharge on potassium supplement  History of PE. He was restarted on Coumadin, INR stable 2.01.   Parkinson's Disease, Dementia  Pt with a history of advanced dementia and Parkinson's disease, low functional status and minimally interactive at baseline.  - Continue home memantine, Sinemet - Continue Seroquel PRN   Dispo: Anticipated discharge in approximately 0-1 day(s).   Neva Seat, MD 06/14/2017, 11:39 AM Pager: [phone]

## 2017-06-14 NOTE — Progress Notes (Signed)
  Date: 06/14/2017  Patient name: [name] [name]  Medical record number: [id number]  Date of birth: [birth year]   I have seen and evaluated this patient and I have discussed the plan of care with the house staff. Please see Dr. Tally Joe note for complete details. I concur with his findings.  Discharge planned for today.    Sid Falcon, MD 06/14/2017, 1:53 PM

## 2017-06-14 NOTE — Discharge Planning (Addendum)
Patient dressing was bleeding a little, per daughter.  Dressing changed and site assessed to reveal no s/sx of further concerns.    Daughter stated patient was in plain and needed pain meds.  Once meds brought, patient stated he was not in pain and showed no s/sx with abdominal palpation. Still, pain meds given.  Daughter/caregiver notes increased distention in patient's abdomen and is concerned about patient being discharged.  RN assessment revealed no new distention since 0830 assessment. Also no acute changes or concerns at this time. Dr. Curly Rim, per daughter's request,  to inform and ask them to round before completing discharge for today. Dr. Milus Mallick to round on patient before comp[leteing discharge. - Daughter informed.

## 2017-06-14 NOTE — Discharge Planning (Signed)
Patients IV x2 removed.  Discharge papers given, explained and educated.  Patient given pain meds, ABD dressing changed and Dr. Nicholes Mango on patient again (just prior to finalizing discharge).  Informed of suggested FU appts and daughter agreed to contact [facility] to confirm time (per Dr. Gabriel Carina request). Script e-scribed to pharm.  RN assessment and VS revealed stability for DC to home with [facility] (RN, PT, OT and Aide).  Patient wheeled to front and assisted in car - being transported home via family in car.

## 2017-06-14 NOTE — Progress Notes (Signed)
[place] for Coumadin Indication: history of PE  Allergies  Allergen Reactions  . Aricept [Donepezil Hcl] Other (See Comments)    Symptomatic Bradycardia.  . Other Other (See Comments)    Shrimp gives him gout  . Shellfish-Derived Products Other (See Comments)    Causes a flare up with Gout   Patient Measurements: Height: 5\' 6"  (167.6 cm) Weight: 149 lb (67.6 kg) IBW/kg (Calculated) : 63.8  Heparin dosing wt: 67.6 kg  Assessment: 83 y.o. male with h/o PE. S/p heparin and Coumadin bridge for hx of PE. Coumadin restarted s/p surgery on 9/20. INR jumped up quickly but now somewhat stable, down at 2.01 today. Hgb low but stable, plts down 123. No bleed documented. May d/c today per MD notes  PTA warfarin: 5 mg Mon/Wed, 2.5 mg all other days (goal 2-3)  Goal of Therapy:  INR 2-3 Monitor platelets by anticoagulation protocol: Yes   Plan:  Give Coumadin 5mg  PO x 1 tonight Monitor daily INR, CBC, s/s of bleed  Elicia Lamp, PharmD, BCPS Clinical Pharmacist Rx Phone # for today: [phone] After 3:30PM, please call Main Rx: #[id number] 06/14/2017 10:03 AM

## 2017-06-14 NOTE — Discharge Planning (Signed)
Dr. Curly Rim to inform of BP 192/95 and to request PRN BP med. Also informed that daughter has received needed equipment from [facility] at home and is here ready for DC if Dr.feels it's ok.

## 2017-06-14 NOTE — ED Triage Notes (Signed)
Pt arrived via [facility] from c/o tarry stools.  Pt had bowel resection last Wednesday.  Hx: Dementia.  Pt takes coumadin.

## 2017-06-14 NOTE — Progress Notes (Signed)
6 Days Post-Op  Subjective: Had a brown, nonbloody stool this morning. Marland Kitchenappears to be resting comfortably.  Only mumbles. No new issues per nursing staff. No nausea or vomiting By mouth intake tolerated but volume of intake is not good. Hemoglobin 11.9.  Perfectly stable.  WBC 5400.  INR 2.01.  On Coumadin  Objective: Vital signs in last 24 hours: Temp:  [97.9 F (36.6 C)-98.5 F (36.9 C)] 97.9 F (36.6 C) (09/25 0500) Pulse Rate:  [70-81] 81 (09/25 0500) Resp:  [16-18] 18 (09/25 0500) BP: (146-157)/(82-92) 157/90 (09/25 0500) SpO2:  [98 %] 98 % (09/25 0500) Last BM Date: 06/12/17  Intake/Output from previous day: 09/24 0701 - 09/25 0700 In: 450 [P.O.:450] Out: 1300 [Urine:1300] Intake/Output this shift: Total I/O In: 210 [P.O.:210] Out: 1300 [Urine:1300]    EXAM: Gen: NAD, comfortable, sleeping, arousable. Not interactive. CV: RRR Pulm: Normal work of breathing Abd: Incision c/d/i without erthema; abd soft, ND, staples in place Ext: SCDs in place   Lab Results:  Results for orders placed or performed during the hospital encounter of 05/30/17 (from the past 24 hour(s))  Hemoglobin and hematocrit, blood     Status: Abnormal   Collection Time: 06/13/17 12:21 PM  Result Value Ref Range   Hemoglobin 11.6 (L) 13.0 - 17.0 g/dL   HCT 35.7 (L) 39.0 - 52.0 %  Protime-INR     Status: Abnormal   Collection Time: 06/14/17  4:39 AM  Result Value Ref Range   Prothrombin Time 22.6 (H) 11.4 - 15.2 seconds   INR 2.01   CBC     Status: Abnormal   Collection Time: 06/14/17  4:39 AM  Result Value Ref Range   WBC 5.4 4.0 - 10.5 K/uL   RBC 3.86 (L) 4.22 - 5.81 MIL/uL   Hemoglobin 11.9 (L) 13.0 - 17.0 g/dL   HCT 35.9 (L) 39.0 - 52.0 %   MCV 93.0 78.0 - 100.0 fL   MCH 30.8 26.0 - 34.0 pg   MCHC 33.1 30.0 - 36.0 g/dL   RDW 14.9 11.5 - 15.5 %   Platelets 123 (L) 150 - 400 K/uL     Studies/Results: No results found.  [name] acetaminophen  650 mg Oral Q6H  . amLODipine  5 mg  Oral Daily  . carbidopa-levodopa  1 tablet Oral QID  . feeding supplement (ENSURE ENLIVE)  237 mL Oral TID BM  . memantine  28 mg Oral Daily  . polyethylene glycol  17 g Oral Daily  . pravastatin  10 mg Oral Daily  . Warfarin - Pharmacist Dosing Inpatient   Does not apply q1800     Assessment/Plan: s/p Procedure(s): SIGMOID  COLECTOMY  Patient Active Problem List   Diagnosis Date Noted  . Generalized abdominal pain   . Palliative care encounter   . Volvulus of colon ([place]) 05/31/2017  . Aortic atherosclerosis ([place]) 05/31/2017  . Bad odor of urine 01/17/2017  . Loss of weight 09/23/2016  . Abdominal distension   . Long term current use of anticoagulant   . Right bundle branch block   . Hypokalemia 08/02/2016  . Diverticulosis 07/15/2016  . Pyelonephritis 07/09/2016  . Statin myopathy 03/15/2016  . Parkinsonian syndrome ([place]) 08/11/2015  . Normocytic anemia, not due to blood loss 04/16/2015  . Chronic systolic heart failure ([place]) 04/16/2015  . Falls frequently 04/16/2015  . Onychomycosis 04/15/2015  . CAD (coronary artery disease) 01/16/2015  . 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 Syncope 12/07/2014  . Healthcare maintenance 10/10/2014  . Lumbosacral  spondylosis without myelopathy 09/24/2013  . Altered mental status 02/20/2013  . Bradycardia 02/20/2013  . Generalized ischemic cerebrovascular disease 02/05/2013  . Depression 01/25/2013  . Gross hematuria 01/19/2013  . Insomnia 01/20/2012  . AKI (acute kidney injury) ([place]) 11/08/2011  . Alzheimer's dementia 08/17/2011  . History of pulmonary embolism 09/18/2010  . Encounter for long-term use of antiplatelets/antithrombotics 09/18/2010  . DJD (degenerative joint disease) 08/06/2010  . ADENOCARCINOMA, PROSTATE, HX OF 08/06/2010  . HLD (hyperlipidemia) 10/15/2009  . Essential hypertension, benign 10/15/2009  . GERD 10/15/2009   Recurrent sigmoid volvulus POD#6S/P SIGMOID COLECTOMY 06/08/17, Dr. Georganna Skeans - GI function returning, active BS's - Soft diet - OOB to chair  History of PE.  Coumadin restarted Parkinson's disease Dementia  Plans:    The patient may be discharged home or to SNF from surgical standpoint    If discharged, follow-up in our office in 7-10 days for staple removal    Postop instructions and follow-up instructions loaded onto discharge summary  Case manager and SW to address disposition -    @PROBHOSP @  LOS: 14 days    [name] [name] M 06/14/2017  . .prob

## 2017-06-15 ENCOUNTER — Telehealth: Payer: Self-pay | Admitting: Cardiology

## 2017-06-15 ENCOUNTER — Emergency Department (HOSPITAL_COMMUNITY): Payer: Medicare HMO

## 2017-06-15 DIAGNOSIS — R140 Abdominal distension (gaseous): Secondary | ICD-10-CM | POA: Diagnosis not present

## 2017-06-15 DIAGNOSIS — R4182 Altered mental status, unspecified: Secondary | ICD-10-CM | POA: Diagnosis not present

## 2017-06-15 DIAGNOSIS — K5649 Other impaction of intestine: Secondary | ICD-10-CM | POA: Diagnosis not present

## 2017-06-15 MED ORDER — IOPAMIDOL (ISOVUE-300) INJECTION 61%
INTRAVENOUS | Status: AC
  Filled 2017-06-15: qty 100

## 2017-06-15 MED ORDER — IOPAMIDOL (ISOVUE-300) INJECTION 61%
INTRAVENOUS | Status: AC
  Administered 2017-06-15: 100 mL
  Filled 2017-06-15: qty 30

## 2017-06-15 NOTE — Discharge Instructions (Signed)
Continue your routine post-operative care. Return if symptoms are getting worse.

## 2017-06-15 NOTE — Telephone Encounter (Signed)
Spoke w/ pt daughter and requested that he send a manual transmission b/c his home monitor has not updated in at least 14 days.   

## 2017-06-15 NOTE — ED Notes (Signed)
Family at bedside, aware we are waiting on CT results. Patient is at his norm. Baseline per family

## 2017-06-15 NOTE — ED Notes (Signed)
Waiting PTAR 

## 2017-06-15 NOTE — ED Notes (Signed)
Patient transported to CT 

## 2017-06-15 NOTE — ED Provider Notes (Signed)
Patient signed out to me to evaluate CT of abdomen and pelvis. He is postop sigmoid colectomy and had a single bowel movement with blood in it at while at home. X-ray had shown questionable free air that had not been seen on prior x-rays. He was signed out to me to evaluate results of CT scan. CT does show small amount of free air in the abdomen, but not an unexpected amount in the postoperative setting. No evidence of perforated viscus or bowel leak. He has had no further bowel movements in the ED. Vital signs have been stable. He is felt to be safe for discharge. Advised to follow-up with his surgeon.  Results for orders placed or performed during the hospital encounter of 06/14/17  CBC with Differential/Platelet  Result Value Ref Range   WBC 4.8 4.0 - 10.5 K/uL   RBC 3.72 (L) 4.22 - 5.81 MIL/uL   Hemoglobin 11.4 (L) 13.0 - 17.0 g/dL   HCT 34.7 (L) 39.0 - 52.0 %   MCV 93.3 78.0 - 100.0 fL   MCH 30.6 26.0 - 34.0 pg   MCHC 32.9 30.0 - 36.0 g/dL   RDW 15.3 11.5 - 15.5 %   Platelets 113 (L) 150 - 400 K/uL   Neutrophils Relative % 76 %   Neutro Abs 3.7 1.7 - 7.7 K/uL   Lymphocytes Relative 15 %   Lymphs Abs 0.7 0.7 - 4.0 K/uL   Monocytes Relative 8 %   Monocytes Absolute 0.4 0.1 - 1.0 K/uL   Eosinophils Relative 1 %   Eosinophils Absolute 0.1 0.0 - 0.7 K/uL   Basophils Relative 0 %   Basophils Absolute 0.0 0.0 - 0.1 K/uL  Comprehensive metabolic panel  Result Value Ref Range   Sodium 138 135 - 145 mmol/L   Potassium 3.6 3.5 - 5.1 mmol/L   Chloride 105 101 - 111 mmol/L   CO2 26 22 - 32 mmol/L   Glucose, Bld 124 (H) 65 - 99 mg/dL   BUN 13 6 - 20 mg/dL   Creatinine, Ser 0.71 0.61 - 1.24 mg/dL   Calcium 8.6 (L) 8.9 - 10.3 mg/dL   Total Protein 5.7 (L) 6.5 - 8.1 g/dL   Albumin 2.6 (L) 3.5 - 5.0 g/dL   AST 29 15 - 41 U/L   ALT 6 (L) 17 - 63 U/L   Alkaline Phosphatase 42 38 - 126 U/L   Total Bilirubin 0.9 0.3 - 1.2 mg/dL   GFR calc non Af Amer >60 >60 mL/min   GFR calc Af Amer >60 >60  mL/min   Anion gap 7 5 - 15  Protime-INR  Result Value Ref Range   Prothrombin Time 26.1 (H) 11.4 - 15.2 seconds   INR 2.41   Type and screen [place]  Result Value Ref Range   ABO/RH(D) A POS    Antibody Screen NEG    Sample Expiration 06/17/2017    Dg Abd 1 View  Result Date: 06/06/2017 CLINICAL DATA:  83 year old male with sigmoid volvulus on recent CT Abdomen and Pelvis and Status post flexible sigmoidoscopy decompression on both 05/30/2017 and 06/02/2017. EXAM: ABDOMEN - 1 VIEW COMPARISON:  06/05/2017 and earlier. FINDINGS: Continued gas-filled large bowel and a tapered appearance of the distal sigmoid colon (arrows) although the massive sigmoid distention seen on 05/30/2017 has regressed. Sigmoid distention has also further regressed since 06/05/2017, now the sigmoid diameter is estimated at 8-9 cm. No dilated small bowel. Numerous pelvic surgical clips again noted. dextroconvex lumbar scoliosis  with advanced lumbar disc and endplate degeneration. No acute osseous abnormality identified. IMPRESSION: Continued tapered appearance of gas in the distal sigmoid colon suspicious for degree of residual volvulus. However, gaseous distension of the sigmoid has regressed over this series of exams, now 8-9 cm diameter. Electronically Signed   By: Genevie Ann M.D.   On: 06/06/2017 10:20   Dg Abd 1 View  Result Date: 06/05/2017 CLINICAL DATA:  Sigmoid volvulus follow-up. EXAM: ABDOMEN - 1 VIEW COMPARISON:  06/03/2017. FINDINGS: Gaseous distension of the sigmoid colon is again noted. Maximum diameter is is 10.1 cm which is not significantly improved from previous exam. Acute air-filled loops of large and small bowel identified elsewhere. No evidence for pneumoperitoneum. IMPRESSION: Persistent dilated loop of sigmoid colon compatible with sigmoid volvulus. Electronically Signed   By: Kerby Moors M.D.   On: 06/05/2017 16:30   Dg Abdomen 1 View  Result Date: 05/30/2017 CLINICAL DATA:   Status post flexible sigmoidoscopy and attempted decompression of sigmoid volvulus. EXAM: ABDOMEN - 1 VIEW COMPARISON:  Abdominal radiographs and CT scan 05/30/2017 FINDINGS: Significant interval decompression of sigmoid colon distention. The sigmoid measured up to 13 cm and now measures a maximum of 6 cm. No free air is demonstrated. Stool has moved down into the rectum. IMPRESSION: Decompression of sigmoid volvulus. No findings to suggest perforation. Electronically Signed   By: Marijo Sanes M.D.   On: 05/30/2017 21:35   Ct Abdomen Pelvis W Contrast  Result Date: 06/15/2017 CLINICAL DATA:  [place] stools after bowel resection. Increased abdominal distention. EXAM: CT ABDOMEN AND PELVIS WITH CONTRAST TECHNIQUE: Multidetector CT imaging of the abdomen and pelvis was performed using the standard protocol following bolus administration of intravenous contrast. CONTRAST:  155mL ISOVUE-300 IOPAMIDOL (ISOVUE-300) INJECTION 61% COMPARISON:  05/30/2017 CT FINDINGS: Lower chest: The heart is top-normal in size without pericardial effusion. There is coronary arteriosclerosis along the LAD. Subsegmental atelectasis and/or scarring at the right lung base is stable. Slight increase left lower lobe pulmonary consolidation since prior exam with atelectasis. Hepatobiliary: No focal liver abnormality is seen. No gallstones, gallbladder wall thickening, or biliary dilatation. Colonic interposition is again seen. Tiny foci of free air are identified about the liver and subdiaphragmatic portion of the abdomen. Although some of this could be explained by recent surgery, most severe should have resolved by valve. The possibility of a persistent bowel leak is not entirely excluded. No definite evidence of free fluid or abscess is seen however. Pancreas: Atrophic. Spleen: Normal in size with tiny 6 mm cyst along the lateral aspect. Adrenals/Urinary Tract: Small bilateral renal cysts, the largest is 3.3 cm on the right. There is mild  bilateral chronic renal scarring and cortical thinning, more so on the left. No obstructive uropathy. The bladder is unremarkable. The adrenal glands are unremarkable. Stomach/Bowel: Status post sigmoid colectomy with intact chain sutures in the left hemipelvis. No adjacent free air nor free fluid to suggest anastomotic dehiscence. No bowel inflammation or obstruction. Enteric contrast is seen within small bowel loops to level of the ileum. Stool is seen within the right colon. No mural thickening or evidence of pneumatosis. Vascular/Lymphatic: Mild atherosclerosis of the abdominal aorta with ectasia. Ectatic appearance of the common iliac arteries right greater than left. No lymphadenopathy. Reproductive: Status post prostatectomy. Other: No intra-abdominal or pelvic abscess. No hematoma. Midline skin staples from prior recent surgery. Musculoskeletal: Levoscoliosis of lumbar spondylosis. No acute nor suspicious osseous abnormalities. IMPRESSION: 1. No obstruction status post sigmoid colectomy. Some residual free air seen  in the upper abdomen which may be due to recent surgery. No definite evidence of bowel leak, free fluid nor abscess. 2. No bowel obstruction or mural thickening.  No bowel inflammation. 3. Chronic stable findings otherwise as above. Electronically Signed   By: Ashley Royalty M.D.   On: 06/15/2017 03:25   Ct Abdomen Pelvis W Contrast  Result Date: 05/30/2017 CLINICAL DATA:  Abdominal distension.  Possible sigmoid volvulus. EXAM: CT ABDOMEN AND PELVIS WITH CONTRAST TECHNIQUE: Multidetector CT imaging of the abdomen and pelvis was performed using the standard protocol following bolus administration of intravenous contrast. CONTRAST:  100 cc ISOVUE-300 IOPAMIDOL (ISOVUE-300) INJECTION 61% COMPARISON:  08/01/2016 FINDINGS: Lower chest: Degraded by motion artifact. Subsegmental atelectasis bilaterally can scarring changes. Elevation of both hemidiaphragms. The heart is normal in size. Stable coronary  artery calcifications. Hepatobiliary: No focal hepatic lesions or intrahepatic biliary dilatation. The gallbladder is grossly normal. Colonic interposition is noted. Pancreas: Pancreatic atrophy but no mass or inflammation. Spleen: Normal size.  Stable small cysts. Adrenals/Urinary Tract: The adrenal glands and kidneys are unremarkable and stable. Multiple small renal cysts and chronic renal scarring changes. No hydronephrosis. The bladder is unremarkable. Prior TURP defect. Stomach/Bowel: Markedly distended and tortuous sigmoid colon with swirled appearance of the mid sigmoid colon consistent with a sigmoid volvulus. This looks very similar to the prior CT scan 2017. Large amount of stool is noted in the ascending colon, transverse colon and descending colon. Vascular/Lymphatic: With marked tortuosity of the thoracic aorta. Moderate atherosclerotic calcifications distally and involving the iliac arteries. No mesenteric or retroperitoneal mass or adenopathy. Reproductive: The prostate gland and seminal vesicles are surgically absent. Other: No inguinal mass or adenopathy. No free pelvic fluid collections. Musculoskeletal: Scoliosis and severe degenerative lumbar spondylosis. IMPRESSION: 1. CT findings consistent with a sigmoid volvulus. Very similar findings to the prior CT scan from November 2017. 2. No other significant abdominal/pelvic findings. 3. Bibasilar scarring changes and atelectasis. Electronically Signed   By: Marijo Sanes M.D.   On: 05/30/2017 17:20   Dg Abd 2 Views  Result Date: 06/14/2017 CLINICAL DATA:  Abdominal distension EXAM: ABDOMEN - 2 VIEW COMPARISON:  06/06/2017, CT 05/30/2017 FINDINGS: Limited decubitus view due to positioning. Questionable lucency beneath the right diaphragm. Cutaneous staples left lower quadrant. Multiple surgical clips in the pelvis. Moderate diffuse gaseous enlargement of primarily colon. IMPRESSION: 1. Interval postsurgical changes in the pelvis. Moderate diffuse  gaseous enlargement of the colon. 2. Lucency beneath the right diaphragm, could be small amount of free air versus overlapping air distended bowel. The decubitus view is very limited by positioning. CT suggested for further evaluation. Electronically Signed   By: Donavan Foil M.D.   On: 06/14/2017 23:51   Dg Abdomen Acute W/chest  Result Date: 05/30/2017 CLINICAL DATA:  Abdominal swelling. History of bowel torsion in the past. No vomiting. EXAM: DG ABDOMEN ACUTE W/ 1V CHEST COMPARISON:  Chest x-ray of November 15, 2016 FINDINGS: The lungs are hypoinflated. The hemidiaphragms are elevated due to of moderately distended gas-filled loops of small and large bowel under the hemidiaphragms. There is left basilar density compatible with atelectasis. The heart is not enlarged. The pulmonary vascularity is not engorged. Within the abdomen there are loops of moderately distended gas-filled bowel present with the largest caliber of a bowel loop as much as 13 cm. The most distended bowel appears to be colon but some small bowel distention is present. There is stool present within portions of the colon. There is a small amount of  rectal gas. No free extraluminal gas collections are observed. There is degenerative change of the lumbar spine with scoliosis convex toward the right. IMPRESSION: Distension of small and large bowel loops within the abdomen. One cannot exclude sigmoid volvulus in the appropriate setting. Surgical consultation and abdominal and pelvic CT scanning would be useful. Electronically Signed   By: Zilah Villaflor  [place] M.D.   On: 05/30/2017 15:14   Dg Abd Portable 1v  Result Date: 06/03/2017 CLINICAL DATA:  Sigmoid volvulus. EXAM: PORTABLE ABDOMEN - 1 VIEW COMPARISON:  June 01, 2017. FINDINGS: Minimally distended air-filled left colon. Air noted within nondistended loops of small bowel. No pneumoperitoneum. Multiple surgical clips in the pelvis again noted. IMPRESSION: Minimally distended air-filled left  colon, which could be related to recent sigmoidoscopy in the absence of recurrent abdominal symptoms. Electronically Signed   By: Titus Dubin M.D.   On: 06/03/2017 10:09   Dg Abd Portable 1v  Result Date: 06/01/2017 CLINICAL DATA:  Sigmoid volvulus, abdominal distention EXAM: PORTABLE ABDOMEN - 1 VIEW COMPARISON:  05/30/2017 abdomen radiographs from 1453 hours prior to sigmoid volvulus decompression and post decompression radiographs at 2112 hours on 05/30/2017. FINDINGS: Abnormal bowel gas pattern with marked gaseous distention of large bowel with similar appearance prior to decompression of sigmoid volvulus on 05/30/2017. The largest distended loop of bowel measures up to 10.8 cm in diameter in the the left lower quadrant. Numerous surgical clips are again seen in the lower pelvis. There is dextroconvex scoliosis of the lumbar spine with spondylosis. No free air is noted.  No organomegaly or suspicious calculi. IMPRESSION: Abnormal bowel gas pattern suspicious for recurrence of sigmoid volvulus. These results were discussed at the time of interpretation on 06/01/2017 at 7:00 pm with Dr. Johny Chess. Electronically Signed   By: Ashley Royalty M.D.   On: 06/01/2017 19:06   Images viewed by me.    Delora Fuel, MD 70/96/28 [phone]

## 2017-06-16 ENCOUNTER — Telehealth: Payer: Self-pay | Admitting: *Deleted

## 2017-06-16 DIAGNOSIS — F0280 Dementia in other diseases classified elsewhere without behavioral disturbance: Secondary | ICD-10-CM | POA: Diagnosis not present

## 2017-06-16 DIAGNOSIS — I110 Hypertensive heart disease with heart failure: Secondary | ICD-10-CM | POA: Diagnosis not present

## 2017-06-16 DIAGNOSIS — G309 Alzheimer's disease, unspecified: Secondary | ICD-10-CM | POA: Diagnosis not present

## 2017-06-16 DIAGNOSIS — R4182 Altered mental status, unspecified: Secondary | ICD-10-CM | POA: Diagnosis not present

## 2017-06-16 DIAGNOSIS — G20 Parkinson's disease: Secondary | ICD-10-CM | POA: Diagnosis not present

## 2017-06-16 DIAGNOSIS — I5022 Chronic systolic (congestive) heart failure: Secondary | ICD-10-CM | POA: Diagnosis not present

## 2017-06-16 MED ORDER — AMLODIPINE BESYLATE 5 MG PO TABS: 2.5000 mg | ORAL_TABLET | Freq: Every day | ORAL | 1 refills | Status: DC

## 2017-06-16 NOTE — Telephone Encounter (Signed)
[place] nurse informed of Dr Autumn Patty response to continue Amlodipine 2.5 mg - stated she will monitor BP - and continue K+ 40 meq and will check level at his appt on 10/8. Stated ok.  She forgot to ask if she needs to check pt's PT/INR and when?  Informed it can be checked at his appt but I will ask the doctor and call her back.

## 2017-06-16 NOTE — Telephone Encounter (Signed)
Right, 10/8 would be a good time for [name] to check his INR and follow up with Dr. Elie Confer.

## 2017-06-16 NOTE — Telephone Encounter (Signed)
Call from [place], [facility] -stated pt was discharged from hospital and needs clarification on 2 meds: 1)Stated Amlodipine was increased to 5 mg but the daughter was not aware so pt has been taken 2.5 mg. BP today is 120/70. Wants to know should pt take 5 mg or continue 2.5 mg and monitor BP? 2) Ordered K+ 40 meq but Dr [place] told daughter to increase to 54 since pt's K+ is still low. Wants to know what to do? Thanks

## 2017-06-16 NOTE — Telephone Encounter (Signed)
Amlodipine 2.5mg  daily is fine as long as his blood pressure remains well controlled. I will update his medication list.   The potassium chloride is written in the DC summary as 40 meq daily, I would stick with that dose. His potassium levels were only mildly low. We can recheck the potassium levels at his appointment on 10/8.

## 2017-06-16 NOTE — Addendum Note (Signed)
Addended by: Lalla Brothers T on: 06/16/2017 02:03 PM   Modules accepted: Orders

## 2017-06-17 NOTE — Telephone Encounter (Signed)
Called [facility] - informed PT/INR can be checked at pt's appt on 10/8 per Dr Evette Doffing.

## 2017-06-20 ENCOUNTER — Telehealth: Payer: Self-pay | Admitting: Internal Medicine

## 2017-06-20 NOTE — Telephone Encounter (Signed)
   Reason for call:   I received a call from [name]'s daughter, [name] [name] at 4:40  PM with questions about medications changes at hospital discharge.   Pertinent Data:   Patient with h/o PE on warfarin; prior to hospitalization for recurrent sigmoid volvulus s/p sigmoid collectomy in Sept 2018, his warfarin dosing was 5mg  W/F and 2.5mg  all other days. Daughter just noticed that on the discharge instructions this had changed to 5mg  M/F with 2.5mg  all other days and wanted to verify the correct dosing. She endorses that the patient is still having some mild oozing and blood in BMs though this has significantly decreased - he has his surgery follow up appointment tomorrow. For now I advised patient to continue W/F dosing of 5mg  because of possible supra or subtherapeutic levels if we were to start M/F schedule. She was in agreement with this. They have an appointment in the [facility] next Monday Oct 8th. I will route encounter to Dr. Elie Confer and Dr. Maudie Mercury for further recommendations they may have about possibly seeing patient sooner for INR check.   Daughter also noticed amlodipine dose change from 2.5mg  daily to 5mg  daily. She had been giving him his usual 2.5mg  daily and measures his BP daily. She reports his BP usually being in 130s/70s.    Assessment / Plan / Recommendations:   For now I advised patient to continue W/F dosing of 5mg  because of possible supra or subtherapeutic levels if we were to start M/F schedule. She was in agreement with this. They have an appointment in the [facility] next Monday Oct 8th. I will route encounter to Dr. Elie Confer and Dr. Maudie Mercury for further recommendations they may have about possibly seeing patient sooner for INR check.  I advised to continue on the lower dose amlodipine 2.5mg  daily and to continue checking his BP's daily. If they are to be consistently higher than 140/90 to call the clinic.   As always, pt is advised that if symptoms worsen or new symptoms arise, they  should go to an urgent care facility or to to ER for further evaluation.   Alphonzo Grieve, MD   06/20/2017, 4:48 PM

## 2017-06-21 ENCOUNTER — Telehealth: Payer: Self-pay

## 2017-06-21 DIAGNOSIS — I110 Hypertensive heart disease with heart failure: Secondary | ICD-10-CM | POA: Diagnosis not present

## 2017-06-21 DIAGNOSIS — G309 Alzheimer's disease, unspecified: Secondary | ICD-10-CM | POA: Diagnosis not present

## 2017-06-21 DIAGNOSIS — F0280 Dementia in other diseases classified elsewhere without behavioral disturbance: Secondary | ICD-10-CM | POA: Diagnosis not present

## 2017-06-21 DIAGNOSIS — R4182 Altered mental status, unspecified: Secondary | ICD-10-CM | POA: Diagnosis not present

## 2017-06-21 DIAGNOSIS — I5022 Chronic systolic (congestive) heart failure: Secondary | ICD-10-CM | POA: Diagnosis not present

## 2017-06-21 DIAGNOSIS — G20 Parkinson's disease: Secondary | ICD-10-CM | POA: Diagnosis not present

## 2017-06-21 NOTE — Telephone Encounter (Signed)
Faxed [facility] form [phone] 06/21/2017.

## 2017-06-22 ENCOUNTER — Encounter: Payer: Self-pay | Admitting: Cardiology

## 2017-06-22 DIAGNOSIS — G20 Parkinson's disease: Secondary | ICD-10-CM | POA: Diagnosis not present

## 2017-06-22 DIAGNOSIS — I5022 Chronic systolic (congestive) heart failure: Secondary | ICD-10-CM | POA: Diagnosis not present

## 2017-06-22 DIAGNOSIS — I110 Hypertensive heart disease with heart failure: Secondary | ICD-10-CM | POA: Diagnosis not present

## 2017-06-22 DIAGNOSIS — G309 Alzheimer's disease, unspecified: Secondary | ICD-10-CM | POA: Diagnosis not present

## 2017-06-22 DIAGNOSIS — F0280 Dementia in other diseases classified elsewhere without behavioral disturbance: Secondary | ICD-10-CM | POA: Diagnosis not present

## 2017-06-22 DIAGNOSIS — R4182 Altered mental status, unspecified: Secondary | ICD-10-CM | POA: Diagnosis not present

## 2017-06-22 NOTE — ED Provider Notes (Signed)
[place] DEPT Provider Note   CSN: [id number] Arrival date & time: 06/14/17  2009     History   Chief Complaint Chief Complaint  Patient presents with  . GI Bleeding    HPI [name] [name] is a 83 y.o. male.  HPI Patient was recently discharged in the hospital.  He presents back to emergency department today secondary to a blood bowel movement.  No abdominal pain.  No fevers or chills.  Denies nausea vomiting.  No additional bowel movement since then.  He recently underwent partial colectomy for a colonic volvulus.   Past Medical History:  Diagnosis Date  . Anxiety   . Coronary artery disease, non-occlusive    with history of MI; Cath 2008 w multivessel nonobstructive CAD  . Dementia   . Depression   . GERD (gastroesophageal reflux disease)   . Hyperlipidemia   . Hypertension   . Memory loss   . Parkinson disease ([place])   . PE (pulmonary embolism)    unprovoked PE completed 6 months of warfarin, warfarin d/ced 02/12/2010, repeat PE 07/10/2010 after a long car ride and now on lifelong coumadin.  . Pituitary microadenoma ([place])    incidental finding CT 12/2009  . Prostate cancer ([place])   . Scoliosis   . Sigmoid volvulus ([place]) 08/02/2016  . Volvulus of colon ([place]) 06/01/2017    Patient Active Problem List   Diagnosis Date Noted  . Generalized abdominal pain   . Palliative care encounter   . Volvulus of colon ([place]) 05/31/2017  . Aortic atherosclerosis ([place]) 05/31/2017  . Bad odor of urine 01/17/2017  . Loss of weight 09/23/2016  . Abdominal distension   . Long term current use of anticoagulant   . Right bundle branch block   . Hypokalemia 08/02/2016  . Diverticulosis 07/15/2016  . Pyelonephritis 07/09/2016  . Statin myopathy 03/15/2016  . Parkinsonian syndrome ([place]) 08/11/2015  . Normocytic anemia, not due to blood loss 04/16/2015  . Chronic systolic heart failure ([place]) 04/16/2015  . Falls frequently 04/16/2015  . Onychomycosis 04/15/2015  . CAD (coronary  artery disease) 01/16/2015  . 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 Syncope 12/07/2014  . Healthcare maintenance 10/10/2014  . Lumbosacral spondylosis without myelopathy 09/24/2013  . Altered mental status 02/20/2013  . Bradycardia 02/20/2013  . Generalized ischemic cerebrovascular disease 02/05/2013  . Depression 01/25/2013  . Gross hematuria 01/19/2013  . Insomnia 01/20/2012  . AKI (acute kidney injury) ([place]) 11/08/2011  . Alzheimer's dementia 08/17/2011  . History of pulmonary embolism 09/18/2010  . Encounter for long-term use of antiplatelets/antithrombotics 09/18/2010  . DJD (degenerative joint disease) 08/06/2010  . ADENOCARCINOMA, PROSTATE, HX OF 08/06/2010  . HLD (hyperlipidemia) 10/15/2009  . Essential hypertension, benign 10/15/2009  . GERD 10/15/2009    Past Surgical History:  Procedure Laterality Date  . CATARACT EXTRACTION    . COLONOSCOPY N/A 05/30/2017   Procedure: COLONOSCOPY;  Surgeon: Ronald Lobo, MD;  Location: [facility] ENDOSCOPY;  Service: Endoscopy;  Laterality: N/A;  . FLEXIBLE SIGMOIDOSCOPY N/A 08/02/2016   Procedure: FLEXIBLE SIGMOIDOSCOPY;  Surgeon: Ronald Lobo, MD;  Location: [facility] ENDOSCOPY;  Service: Endoscopy;  Laterality: N/A;  . FLEXIBLE SIGMOIDOSCOPY N/A 08/06/2016   Procedure: FLEXIBLE SIGMOIDOSCOPY;  Surgeon: Ronald Lobo, MD;  Location: [facility] ENDOSCOPY;  Service: Endoscopy;  Laterality: N/A;  . FLEXIBLE SIGMOIDOSCOPY Left 06/02/2017   Procedure: FLEXIBLE SIGMOIDOSCOPY;  Surgeon: Arta Silence, MD;  Location: [facility] ENDOSCOPY;  Service: Endoscopy;  Laterality: Left;  . FLEXIBLE SIGMOIDOSCOPY N/A 06/07/2017   Procedure: FLEXIBLE SIGMOIDOSCOPY for possible decompression;  Surgeon: Otis Brace, MD;  Location: [facility] ENDOSCOPY;  Service: Gastroenterology;  Laterality: N/A;  . LEFT HEART CATHETERIZATION WITH CORONARY ANGIOGRAM N/A 12/10/2014   Procedure: LEFT HEART CATHETERIZATION WITH CORONARY ANGIOGRAM;  Surgeon: [name], MD;  Location: [facility] CATH LAB;   Service: Cardiovascular;  Laterality: N/A;  . LOOP RECORDER IMPLANT N/A 12/16/2014   Procedure: LOOP RECORDER IMPLANT;  Surgeon: [name], MD;  Location: [facility] CATH LAB;  Service: Cardiovascular;  Laterality: N/A;  . PARTIAL COLECTOMY N/A 06/08/2017   Procedure: SIGMOID  COLECTOMY;  Surgeon: Georganna Skeans, MD;  Location: [facility];  Service: General;  Laterality: N/A;  . PROSTATECTOMY         Home Medications    Prior to Admission medications   Medication Sig Start Date End Date Taking? Authorizing Provider  acetaminophen (TYLENOL) 500 MG tablet Take 1,000 mg by mouth 2 (two) times daily.    Yes [provider]  amLODipine (NORVASC) 5 MG tablet Take 0.5 tablets (2.5 mg total) by mouth daily. 06/16/17   Axel Filler, MD  aspirin 81 MG chewable tablet Chew 81 mg by mouth daily as needed (chest pain).     [provider]  b complex vitamins tablet Take 1 tablet by mouth every morning.     [provider]  Ca Phosphate-Cholecalciferol (CALCIUM/VITAMIN D3 GUMMIES) 250-350 MG-UNIT CHEW Chew 1 Dose by mouth 2 (two) times daily. 07/08/16   [name], DO  carbidopa-levodopa (SINEMET IR) 25-100 MG tablet Take 1 tablet by mouth 4 (four) times daily. 05/26/17   [name], DO  fish oil-omega-3 fatty acids 1000 MG capsule Take 2 g by mouth every morning.     [provider]  memantine (NAMENDA XR) 28 MG CP24 24 hr capsule Take 1 capsule (28 mg total) by mouth every morning. 04/25/17   Garvin Fila, MD  Multiple Vitamins-Minerals (MULTIVITAMIN WITH MINERALS) tablet Take 1 tablet by mouth every morning.     [provider]  polyethylene glycol (MIRALAX / GLYCOLAX) packet Take 17 g by mouth daily as needed. Patient taking differently: Take 17 g by mouth daily as needed for mild constipation.  07/13/16   Velna Ochs, MD  potassium chloride SA (KLOR-CON M20) 20 MEQ tablet Take 2 tablets (40 mEq total) by mouth daily. 05/26/17   [name], DO  pravastatin (PRAVACHOL) 10 MG tablet Take 1 tablet (10 mg total) by mouth daily. 03/08/17   [name], DO  QUEtiapine (SEROQUEL) 25 MG tablet Take 1 tablet (25 mg total) by mouth at bedtime. May use half a tablet 12.5 mg during the daytime as needed for agitation/hallucinations 04/05/17   Garvin Fila, MD  senna (SENOKOT) 8.6 MG TABS tablet Take 1 tablet (8.6 mg total) by mouth daily as needed for mild constipation. 07/13/16   Velna Ochs, MD  vitamin C (ASCORBIC ACID) 500 MG tablet Take 1,000 mg by mouth every morning.     [provider]  warfarin (COUMADIN) 5 MG tablet Take 0.5-1 tablets (2.5-5 mg total) by mouth See admin instructions. Take 1 tablet on Mon, and Fri. Take 1/2 tablet all other days Patient taking differently: Take 2.5-5 mg by mouth See admin instructions. 5mg  Mon, Wed - 2.5mg  all other days 05/26/17   [name], DO    Family History Family History  Problem Relation Age of Onset  . Hypertension Father        Passed away from cerebral hemorrhage at age  of 40.  . Dementia Mother        Passed away  . Hypertension Child        4 adult children  . Cervical cancer Other   . Lung cancer Other   . Stroke Other   . Heart attack Neg Hx     Social History Social History  Substance Use Topics  . Smoking status: Never Smoker  . Smokeless tobacco: Never Used  . Alcohol use No     Allergies   Aricept [donepezil hcl]; Other; and Shellfish-derived products   Review of Systems Review of Systems  All other systems reviewed and are negative.    Physical Exam Updated Vital Signs BP (!) 177/98   Pulse 86   Temp 98.1 F (36.7 C) (Oral)   Resp 17   Ht 5\' 6"  (1.676 m)   Wt 67.6 kg (149 lb)   SpO2 97%   BMI 24.05 kg/m   Physical Exam  Constitutional: He is oriented to person, place, and time. He appears well-developed and well-nourished.  HENT:  Head: Normocephalic and atraumatic.  Eyes: EOM are normal.  Neck: Normal range of  motion.  Cardiovascular: Normal rate, regular rhythm, normal heart sounds and intact distal pulses.   Pulmonary/Chest: Effort normal and breath sounds normal. No respiratory distress.  Abdominal: Soft. He exhibits no distension. There is no tenderness.  Musculoskeletal: Normal range of motion.  Neurological: He is alert and oriented to person, place, and time.  Skin: Skin is warm and dry.  Psychiatric: He has a normal mood and affect. Judgment normal.  Nursing note and vitals reviewed.    ED Treatments / Results  Labs (all labs ordered are listed, but only abnormal results are displayed) Labs Reviewed  CBC WITH DIFFERENTIAL/PLATELET - Abnormal; Notable for the following:       Result Value   RBC 3.72 (*)    Hemoglobin 11.4 (*)    HCT 34.7 (*)    Platelets 113 (*)    All other components within normal limits  COMPREHENSIVE METABOLIC PANEL - Abnormal; Notable for the following:    Glucose, Bld 124 (*)    Calcium 8.6 (*)    Total Protein 5.7 (*)    Albumin 2.6 (*)    ALT 6 (*)    All other components within normal limits  PROTIME-INR - Abnormal; Notable for the following:    Prothrombin Time 26.1 (*)    All other components within normal limits  TYPE AND SCREEN    EKG  EKG Interpretation  Date/Time:  Tuesday June 14 2017 20:28:51 EDT Ventricular Rate:  82 PR Interval:    QRS Duration: 160 QT Interval:  404 QTC Calculation: 472 R Axis:   52 Text Interpretation:  Sinus rhythm Atrial premature complexes Right bundle branch block Artifact in lead(s) I II [name] aVR aVL aVF V2 When compared with ECG of 05/30/2017, Premature atrial complexes are now present Confirmed by Delora Fuel ([id number]) on 06/14/2017 11:20:45 PM       Radiology No results found.  Procedures Procedures (including critical care time)  Medications Ordered in ED Medications  iopamidol (ISOVUE-300) 61 % injection (100 mLs  Contrast Given 06/15/17 0247)     Initial Impression / Assessment and Plan /  ED Course  I have reviewed the triage vital signs and the nursing notes.  Pertinent labs & imaging results that were available during my care of the patient were reviewed by me and considered in my medical decision making (see  chart for details).     Care to Dr Roxanne Mins to follow up on CT imaging.  Abdomen is without significant tenderness.  Hemoglobin is stable.  Feels better at this time.  Baseline mental status per family.  Final Clinical Impressions(s) / ED Diagnoses   Final diagnoses:  Abdominal distension  Rectal bleeding  Normochromic normocytic anemia    New Prescriptions Discharge Medication List as of 06/15/2017  3:48 AM       Jola Schmidt, MD 06/22/17 [phone]

## 2017-06-23 ENCOUNTER — Telehealth: Payer: Self-pay | Admitting: *Deleted

## 2017-06-23 ENCOUNTER — Telehealth: Payer: Self-pay

## 2017-06-23 DIAGNOSIS — F0280 Dementia in other diseases classified elsewhere without behavioral disturbance: Secondary | ICD-10-CM | POA: Diagnosis not present

## 2017-06-23 DIAGNOSIS — I5022 Chronic systolic (congestive) heart failure: Secondary | ICD-10-CM | POA: Diagnosis not present

## 2017-06-23 DIAGNOSIS — G309 Alzheimer's disease, unspecified: Secondary | ICD-10-CM | POA: Diagnosis not present

## 2017-06-23 DIAGNOSIS — R4182 Altered mental status, unspecified: Secondary | ICD-10-CM | POA: Diagnosis not present

## 2017-06-23 DIAGNOSIS — N390 Urinary tract infection, site not specified: Secondary | ICD-10-CM | POA: Diagnosis not present

## 2017-06-23 DIAGNOSIS — G20 Parkinson's disease: Secondary | ICD-10-CM | POA: Diagnosis not present

## 2017-06-23 DIAGNOSIS — I110 Hypertensive heart disease with heart failure: Secondary | ICD-10-CM | POA: Diagnosis not present

## 2017-06-23 NOTE — Telephone Encounter (Signed)
agree

## 2017-06-23 NOTE — Telephone Encounter (Signed)
If [facility] could obtain urine speciment I would like to get a U/A this may be ordered as STAT if needed.  I would also like a urine culture obtained on the specimen.

## 2017-06-23 NOTE — Telephone Encounter (Signed)
1x week for 2 weeks, 2xweek for 1 week, gait training, safety, transfers VO given for OT, do you agree

## 2017-06-23 NOTE — Telephone Encounter (Signed)
Lm for ot to rtc

## 2017-06-23 NOTE — Telephone Encounter (Signed)
Pt's daughter calls and states pt is having blood in what  she believes his urine, states his pad had a large diluted area of dark pinkish liquid. The quickest she can bring him in would be fri pm. She is asking if [facility] can be ask to do a urine on pt, if so do you want it stat, he does have an already scheduled appt w/ dr Heber [place] Monday Please advise

## 2017-06-23 NOTE — Telephone Encounter (Signed)
[name] [name] home health care form 06/22/2017.

## 2017-06-23 NOTE — Telephone Encounter (Signed)
Spoke w/ [name] [name], ask her to continue to monitor for changes and take pt to ED or urg care this weekend also may call im resident on call if needed Spoke w/ [name] [name] and gave stat order, spoke w/ [name] [name], results may not come back until this evening, culture this weekend, gave her triage ph# and fax

## 2017-06-23 NOTE — Telephone Encounter (Signed)
[name] [name] with [facility] home health requesting to speak with a nurse about plan of care.

## 2017-06-24 ENCOUNTER — Encounter: Payer: Self-pay | Admitting: Internal Medicine

## 2017-06-24 ENCOUNTER — Ambulatory Visit: Payer: Medicare HMO

## 2017-06-24 DIAGNOSIS — I5022 Chronic systolic (congestive) heart failure: Secondary | ICD-10-CM | POA: Diagnosis not present

## 2017-06-24 DIAGNOSIS — G20 Parkinson's disease: Secondary | ICD-10-CM | POA: Diagnosis not present

## 2017-06-24 DIAGNOSIS — F0280 Dementia in other diseases classified elsewhere without behavioral disturbance: Secondary | ICD-10-CM | POA: Diagnosis not present

## 2017-06-24 DIAGNOSIS — I110 Hypertensive heart disease with heart failure: Secondary | ICD-10-CM | POA: Diagnosis not present

## 2017-06-24 DIAGNOSIS — R4182 Altered mental status, unspecified: Secondary | ICD-10-CM | POA: Diagnosis not present

## 2017-06-24 DIAGNOSIS — G309 Alzheimer's disease, unspecified: Secondary | ICD-10-CM | POA: Diagnosis not present

## 2017-06-27 ENCOUNTER — Ambulatory Visit: Payer: Medicare HMO

## 2017-06-27 ENCOUNTER — Encounter: Payer: Self-pay | Admitting: Internal Medicine

## 2017-06-27 ENCOUNTER — Ambulatory Visit (INDEPENDENT_AMBULATORY_CARE_PROVIDER_SITE_OTHER): Payer: Medicare HMO | Admitting: Internal Medicine

## 2017-06-27 VITALS — BP 151/92 | HR 75 | Temp 98.4°F | Ht 67.5 in

## 2017-06-27 DIAGNOSIS — Z7901 Long term (current) use of anticoagulants: Secondary | ICD-10-CM | POA: Diagnosis not present

## 2017-06-27 DIAGNOSIS — E876 Hypokalemia: Secondary | ICD-10-CM | POA: Diagnosis not present

## 2017-06-27 DIAGNOSIS — K562 Volvulus: Secondary | ICD-10-CM

## 2017-06-27 DIAGNOSIS — L89321 Pressure ulcer of left buttock, stage 1: Secondary | ICD-10-CM | POA: Diagnosis not present

## 2017-06-27 DIAGNOSIS — N390 Urinary tract infection, site not specified: Secondary | ICD-10-CM | POA: Insufficient documentation

## 2017-06-27 DIAGNOSIS — I5022 Chronic systolic (congestive) heart failure: Secondary | ICD-10-CM | POA: Diagnosis not present

## 2017-06-27 DIAGNOSIS — F0280 Dementia in other diseases classified elsewhere without behavioral disturbance: Secondary | ICD-10-CM | POA: Diagnosis not present

## 2017-06-27 DIAGNOSIS — N3000 Acute cystitis without hematuria: Secondary | ICD-10-CM

## 2017-06-27 DIAGNOSIS — G309 Alzheimer's disease, unspecified: Secondary | ICD-10-CM | POA: Diagnosis not present

## 2017-06-27 DIAGNOSIS — G20 Parkinson's disease: Secondary | ICD-10-CM | POA: Diagnosis not present

## 2017-06-27 DIAGNOSIS — L89309 Pressure ulcer of unspecified buttock, unspecified stage: Secondary | ICD-10-CM | POA: Insufficient documentation

## 2017-06-27 DIAGNOSIS — I10 Essential (primary) hypertension: Secondary | ICD-10-CM | POA: Diagnosis not present

## 2017-06-27 DIAGNOSIS — R4182 Altered mental status, unspecified: Secondary | ICD-10-CM | POA: Diagnosis not present

## 2017-06-27 DIAGNOSIS — I110 Hypertensive heart disease with heart failure: Secondary | ICD-10-CM | POA: Diagnosis not present

## 2017-06-27 MED ORDER — CIPROFLOXACIN HCL 500 MG PO TABS: 500.0000 mg | ORAL_TABLET | Freq: Two times a day (BID) | ORAL | 0 refills | Status: DC

## 2017-06-27 NOTE — Patient Instructions (Addendum)
It was a pleasure to see you [name].  I am glad you are starting to feel better. Today we discussed:  1. Colectomy for Sigmoid Volvulus - Please follow up with your surgeon as scheduled  2. Urinary Tract Infection - Complete antibiotic treatment with Ciprofloxacin 500 mg twice a day for 5 days  3. High Blood Pressure - Continue Amlodipine 2.5 mg daily; Continue to monitor at home and call if persistently higher than 150/90  4. Potassium - Continue Potassium 40 mEq daily (two 20 mEq tablets) - We are checking the potassium levels and call if we need to adjust   5. Coumadin - We will ask [facility] to recheck the INR on Friday and call Dr. Elie Confer with the results.  6. Pressure Sore on Buttocks - Continue offloading measures to decrease pressure on buttocks - Will try to arrange for condom catheter to decrease risk of infection to sore  Please follow up with Dr. Heber Bradley in 4-8 weeks or sooner if needed.   Pressure Injury A pressure injury, sometimes called a bedsore, is an injury to the skin and underlying tissue caused by pressure. Pressure on blood vessels causes decreased blood flow to the skin, which can eventually cause the skin tissue to die and break down into a wound. Pressure injuries usually occur:  Over bony parts of the body such as the tailbone, shoulders, elbows, hips, and heels.  Under medical devices such as respiratory equipment, stockings, tubes, and splints.  Pressure injuries start as reddened areas on the skin and can lead to pain, muscle damage, and infection. Pressure injuries can vary in severity. What are the causes? Pressure injuries are caused by a lack of blood supply to an area of skin. They can occur from intense pressure over a short period of time or from less intense pressure over a long period of time. What increases the risk? This condition is more likely to develop in people who:  Are in the hospital or an extended care  facility.  Are bedridden or in a wheelchair.  Have an injury or disease that keeps them from: ? Moving normally. ? Feeling pain or pressure.  Have a condition that: ? Makes them sleepy or less alert. ? Causes poor blood flow.  Need to wear a medical device.  Have poor control of their bladder or bowel functions (incontinence).  Have poor nutrition (malnutrition).  Are of certain ethnicities. People of African American and Latino or Hispanic descent are at higher risk compared to other ethnic groups.  If you are at risk for pressure ulcers, your health care provider may recommend certain types of bedding to help prevent them. These may include foam or gel mattresses covered with one of the following:  A sheepskin blanket.  A pad that is filled with gel, air, water, or foam.  What are the signs or symptoms? The main symptom is a blister or change in skin color that opens into a wound. Other symptoms include:  Red or dark areas of skin that do not turn white or pale when pressed with a finger.  Pain, warmth, or change of skin texture.  How is this diagnosed? This condition is diagnosed with a medical history and physical exam. You may also have tests, including:  Blood tests to check for infection or signs of poor nutrition.  Imaging studies to check for damage to the deep tissues under your skin.  Blood flow studies.  Your pressure injury will be staged to determine  its severity. Staging is an assessment of:  The depth of the pressure injury.  Which tissues are exposed because of the pressure injury.  The causes of the pressure injury.  How is this treated? The main focus of treatment is to help your injury heal. This may be done by:  Relieving or redistributing pressure on your skin. This includes: ? Frequently changing your position. ? Eliminating or minimizing positions that caused the wound or that can make the wound worse. ? Using specific bed mattresses and  chair cushions. ? Refitting, resizing, or replacing any medical devices, or padding the skin under them. ? Using creams or powders to prevent rubbing (friction) on the skin.  Keeping your skin clean and dry. This may include using a skin cleanser or skin protectant as told by your health care provider. This may be a lotion, ointment, or spray.  Cleaning your injury and removing any dead tissue from the wound (debridement).  Placing a bandage (dressing) over your injury.  Preventing or treating infection. This may include antibiotic, antimicrobial, or antiseptic medicines.  Treatment may also include medicine for pain. Sometimes surgery is needed to close the wound with a flap of healthy skin or a piece of skin from another area of your body (graft). You may need surgery if other treatments are not working or if your injury is very deep. Follow these instructions at home: Wound care  Follow instructions from your health care provider about: ? How to take care of your wound. ? When and how you should change your dressing. ? When you should remove your dressing. If your dressing is dry and stuck when you try to remove it, moisten or wet the dressing with saline or water so that it can be removed without harming your skin or wound tissue.  Check your wound every day for signs of infection. Have a caregiver do this for you if you are not able. Watch for: ? More redness, swelling, or pain. ? More fluid, blood, or pus. ? A bad smell. Skin Care  Keep your skin clean and dry. Gently pat your skin dry.  Do not rub or massage your skin.  Use a skin protectant only as told by your health care provider.  Check your skin every day for any changes in color or any new blisters or sores (ulcers). Have a caregiver do this for you if you are not able. Medicines  Take over-the-counter and prescription medicines only as told by your health care provider.  If you were prescribed an antibiotic  medicine, take it or apply it as told by your health care provider. Do not stop taking or using the antibiotic even if your condition improves. Reducing and Redistributing Pressure  Do not lie or sit in one position for a long time. Move or change position every two hours or as told by your health care provider.  Use pillows or cushions to reduce pressure. Ask your health care provider to recommend cushions or pads for you.  Use medical devices that do not rub your skin. Tell your health care provider if one of your medical devices is causing a pressure injury to develop. General instructions   Eat a healthy diet that includes lots of protein. Ask your health care provider for diet advice.  Drink enough fluid to keep your urine clear or pale yellow.  Be as active as you can every day. Ask your health care provider to suggest safe exercises or activities.  Do not  abuse drugs or alcohol.  Keep all follow-up visits as told by your health care provider. This is important.  Do not smoke. Contact a health care provider if:   You have chills or fever.  Your pain medicine is not helping.  You have any changes in skin color.  You have new blisters or sores.  You develop warmth, redness, or swelling near a pressure injury.  You have a bad odor or pus coming from your pressure injury.  You lose control of your bowels or bladder.  You develop new symptoms.  Your wound does not improve after 1-2 weeks of treatment.  You develop a new medical condition, such as diabetes, peripheral vascular disease, or conditions that affect your defense (immune) system. This information is not intended to replace advice given to you by your health care provider. Make sure you discuss any questions you have with your health care provider. Document Released: 09/06/2005 Document Revised: 02/09/2016 Document Reviewed: 01/15/2015 Elsevier Interactive Patient Education  [employer].

## 2017-06-27 NOTE — Progress Notes (Signed)
CC: S/p sigmoid colectomy  HPI:  [name] [name] is a 83 y.o. 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 He is accompanied by his daughter who provides most of the history.  Recurrent Sigmoid Volvulus s/p Sigmoid Colectomy 06/08/17: Admitted from 9/10-9/25/18. Had decompression via flexible sigmoidoscopy x 2. Developed third recurrence of volvulus while admitted and family elected to pursue surgical intervention. He has been progressing well since discharge without further bloody bowel movements. He is having daily bowel movements that are watery to soft in consistency. He has been seen in the surgery office since discharge and had his staples removed. He will follow up with surgery again on 06/29/17.  UTI: Patient was noted to have bright red blood of urine at home. A urinalysis and urine culture were obtained. UA showed positive leukocyte esterase, negative nitrites, 6-10 WBCs, 0-2 RBCs. Urine culture grew >100,000 CFU of Pseudomonas aeruginosa which was pan-sensitive.  HTN: Has been on Amlodipine 2.5 mg daily at home prior to recent hospitalization. He was advised to increase to 5 mg daily, but has continued with 2.5 mg daily at home per daughter. Home BP checks have been in the 130s/70s. He has a history of frequent falls. BP on arrival to clinic today is 151/92.  Hypokalemia: Currently on K 40 mEq daily.   Long-term Current Use of Anticoagulant: On chronic anticoagulation due to recurrent VTE, reports adherence to Coumadin. Daughter denies any further recent bleeding. INR today is 1.9.  Stage I Pressure Injury of Left Buttock: Daughter and CNA noticed a pressure sore inside of left gluteal cleft with loss of superficial skin. The have worked on rotating patient to reduce pressure over  buttocks.    Past Medical History:  Diagnosis Date  . Anxiety   . Coronary artery disease, non-occlusive    with history of MI; Cath 2008 w multivessel nonobstructive CAD  . Dementia   . Depression   . GERD (gastroesophageal reflux disease)   . Hyperlipidemia   . Hypertension   . Memory loss   . Parkinson disease ([place])   . PE (pulmonary embolism)    unprovoked PE completed 6 months of warfarin, warfarin d/ced 02/12/2010, repeat PE 07/10/2010 after a long car ride and now on lifelong coumadin.  . Pituitary microadenoma ([place])    incidental finding CT 12/2009  . Prostate cancer ([place])   . Scoliosis   . Sigmoid volvulus ([place]) 08/02/2016  . Volvulus of colon ([place]) 06/01/2017   Review of Systems:   Review of Systems  Constitutional: Negative for chills, diaphoresis and fever.  HENT: Negative for nosebleeds.   Respiratory: Negative for hemoptysis and shortness of breath.   Cardiovascular: Negative for chest pain.  Gastrointestinal: Negative for abdominal pain, blood in stool, constipation, melena, nausea and vomiting.  Genitourinary: Positive for hematuria.  Musculoskeletal: Negative for falls.  Neurological: Negative for loss of consciousness.    Physical Exam:  Vitals:   06/27/17 1556  BP: (!) 151/92  Pulse: 75  Temp: 98.4 F (36.9 C)  TempSrc: Oral  SpO2: 99%  Height: 5' 7.5" (1.715 m)   Physical Exam  Constitutional: He appears well-developed. No distress.  Sitting in wheelchair.  HENT:  Head: Normocephalic and atraumatic.  Cardiovascular: Normal rate and regular rhythm.   Pulmonary/Chest: Effort normal. No respiratory distress. He has no wheezes.  Abdominal: Soft. Bowel sounds are normal.  He exhibits no distension. There is no guarding.  Mild tenderness RLQ  Musculoskeletal: He exhibits no edema.  Neurological: He is alert.  Skin: He is not diaphoretic.  Stage I pressure sore left gluteal cleft ~ 1x1 cm without active bleeding or discharge.    Assessment &  Plan:   See Encounters Tab for problem based charting.  Patient seen with Dr. Angelia Mould  Volvulus of sigmoid colon [facility]) S/p sigmoid colectomy on 06/08/17 without colostomy. Has progressed well. Has not had any bloody stool for nearly 10 days. He is having daily bowel movements, occasionally watery or soft. Daughter reports he is eating and drinking well without nausea or vomiting. - f/u with surgery as scheduled  UTI (urinary tract infection) Urine culture grew >100,000 CFU of Pseudomonas aeruginosa which was pan-sensitive. Will treat with Ciprofloxacin 500 mg BID for 5 days.  Essential hypertension, benign BP Readings from Last 3 Encounters:  06/27/17 (!) 151/92  06/15/17 (!) 177/98  06/14/17 (!) 123/94   BP mildly elevated today. Daughter reports better control at home. Will continue current therapy with Amlodipine 2.5 mg daily and continue to monitor BPs at home.  Hypokalemia History of significant hypokalemia, now maintained on K 40 mEq daily supplement. Will check BMET to monitor K.  Long term current use of anticoagulant On chronic anticoagulation with coumadin for recurrent VTE. INR is 1.9 today. Dr. Elie Confer is following. Daughter states that patient has not had any bleeding for nearly ten days. Will ask [facility] to repeat INR on 07/01/17 and relay results to Dr. Elie Confer for adjustments in Coumadin dosing as needed and frequency of home INR checks.  Pressure sore on buttocks Noted at home by daughter and home health nursing. We did see a stage I pressure sore of the left gluteal fold today without active bleeding or discharge. Recommend continued offloading with rotation off pressure sore site. Can use transparent film to keep area covered. Can use condom catheter to reduce risk of infection with urine to sore site. Will contact [facility] to arrange condom catheter. Continue to monitor as he is at risk for development of worsening ulcer.

## 2017-06-27 NOTE — Assessment & Plan Note (Signed)
Urine culture grew >100,000 CFU of Pseudomonas aeruginosa which was pan-sensitive. Will treat with Ciprofloxacin 500 mg BID for 5 days.

## 2017-06-27 NOTE — Assessment & Plan Note (Addendum)
S/p sigmoid colectomy on 06/08/17 without colostomy. Has progressed well. Has not had any bloody stool for nearly 10 days. He is having daily bowel movements, occasionally watery or soft. Daughter reports he is eating and drinking well without nausea or vomiting. - f/u with surgery as scheduled

## 2017-06-27 NOTE — Assessment & Plan Note (Addendum)
Noted at home by daughter and home health nursing. We did see a stage I pressure sore of the left gluteal fold today without active bleeding or discharge. Recommend continued offloading with rotation off pressure sore site. Can use transparent film to keep area covered. Can use condom catheter to reduce risk of infection with urine to sore site. Will contact [facility] to arrange condom catheter. Continue to monitor as he is at risk for development of worsening ulcer.

## 2017-06-27 NOTE — Assessment & Plan Note (Signed)
BP Readings from Last 3 Encounters:  06/27/17 (!) 151/92  06/15/17 (!) 177/98  06/14/17 (!) 123/94   BP mildly elevated today. Daughter reports better control at home. Will continue current therapy with Amlodipine 2.5 mg daily and continue to monitor BPs at home.

## 2017-06-27 NOTE — Assessment & Plan Note (Signed)
On chronic anticoagulation with coumadin for recurrent VTE. INR is 1.9 today. Dr. Elie Confer is following. Daughter states that patient has not had any bleeding for nearly ten days. Will ask [facility] to repeat INR on 07/01/17 and relay results to Dr. Elie Confer for adjustments in Coumadin dosing as needed and frequency of home INR checks.

## 2017-06-27 NOTE — Assessment & Plan Note (Signed)
History of significant hypokalemia, now maintained on K 40 mEq daily supplement. Will check BMET to monitor K.

## 2017-06-28 ENCOUNTER — Telehealth: Payer: Self-pay

## 2017-06-28 DIAGNOSIS — G20 Parkinson's disease: Secondary | ICD-10-CM | POA: Diagnosis not present

## 2017-06-28 DIAGNOSIS — G309 Alzheimer's disease, unspecified: Secondary | ICD-10-CM | POA: Diagnosis not present

## 2017-06-28 DIAGNOSIS — I5022 Chronic systolic (congestive) heart failure: Secondary | ICD-10-CM | POA: Diagnosis not present

## 2017-06-28 DIAGNOSIS — R4182 Altered mental status, unspecified: Secondary | ICD-10-CM | POA: Diagnosis not present

## 2017-06-28 DIAGNOSIS — I110 Hypertensive heart disease with heart failure: Secondary | ICD-10-CM | POA: Diagnosis not present

## 2017-06-28 DIAGNOSIS — F0280 Dementia in other diseases classified elsewhere without behavioral disturbance: Secondary | ICD-10-CM | POA: Diagnosis not present

## 2017-06-28 LAB — BMP8+ANION GAP
ANION GAP: 15 mmol/L (ref 10.0–18.0)
BUN / CREAT RATIO: 9 — AB (ref 10–24)
BUN: 7 mg/dL — ABNORMAL LOW (ref 8–27)
CALCIUM: 9.4 mg/dL (ref 8.6–10.2)
CHLORIDE: 107 mmol/L — AB (ref 96–106)
CO2: 23 mmol/L (ref 20–29)
CREATININE: 0.77 mg/dL (ref 0.76–1.27)
GFR calc Af Amer: 96 mL/min/{1.73_m2} (ref >59)
GFR calc non Af Amer: 83 mL/min/{1.73_m2} (ref >59)
GLUCOSE: 87 mg/dL (ref 65–99)
Potassium: 4.7 mmol/L (ref 3.5–5.2)
Sodium: 145 mmol/L — ABNORMAL HIGH (ref 134–144)

## 2017-06-28 NOTE — Progress Notes (Signed)
Internal Medicine Clinic Attending  I saw and evaluated the patient.  I personally confirmed the key portions of the history and exam documented by Dr. Patel and I reviewed pertinent patient test results.  The assessment, diagnosis, and plan were formulated together and I agree with the documentation in the resident's note.  

## 2017-06-28 NOTE — Telephone Encounter (Signed)
Needs to speak with a nurse about condom cathter. Please call pt back.

## 2017-06-28 NOTE — Telephone Encounter (Signed)
Pt's daughter, Lilli Light, stated she talked to [facility] - they will need an order for condom catheter also to check pt's INR at home. Fax # - [phone]. Thanks

## 2017-06-28 NOTE — Telephone Encounter (Signed)
Have given orders to becky at [facility] for condom cath and INR- Friday, per dr patel and Heber [place]

## 2017-06-28 NOTE — Telephone Encounter (Signed)
Called pt - no answer; left message to give us a call back. 

## 2017-06-29 ENCOUNTER — Telehealth: Payer: Self-pay

## 2017-06-29 ENCOUNTER — Telehealth: Payer: Self-pay | Admitting: Neurology

## 2017-06-29 DIAGNOSIS — I110 Hypertensive heart disease with heart failure: Secondary | ICD-10-CM | POA: Diagnosis not present

## 2017-06-29 DIAGNOSIS — G309 Alzheimer's disease, unspecified: Secondary | ICD-10-CM | POA: Diagnosis not present

## 2017-06-29 DIAGNOSIS — R4182 Altered mental status, unspecified: Secondary | ICD-10-CM | POA: Diagnosis not present

## 2017-06-29 DIAGNOSIS — I5022 Chronic systolic (congestive) heart failure: Secondary | ICD-10-CM | POA: Diagnosis not present

## 2017-06-29 DIAGNOSIS — F0280 Dementia in other diseases classified elsewhere without behavioral disturbance: Secondary | ICD-10-CM | POA: Diagnosis not present

## 2017-06-29 DIAGNOSIS — G20 Parkinson's disease: Secondary | ICD-10-CM | POA: Diagnosis not present

## 2017-06-29 NOTE — Telephone Encounter (Signed)
Thank you Helen 

## 2017-06-29 NOTE — Telephone Encounter (Signed)
Faxed [facility] form 06/29/2017.

## 2017-06-29 NOTE — Telephone Encounter (Signed)
Rn call patients daughter  about her father having problems swallowing. Pt had a major surgery on his bowel where it was twisted and they have to be repaired.She reported since last night he is having problems swallowing,and having some drooling. Pt is seeing a GI doctor, and GI surgeon.  Pt was seen by GI doctor today, but daughter states she did not tell them. Rn recommend she call the surgeon or GI doctor about his GI issues. Pts daughter verbalized understanding.

## 2017-06-29 NOTE — Telephone Encounter (Signed)
Patient's daughter calling concerned that the patient is having trouble swallowing since last night. Please call and discuss.

## 2017-06-30 DIAGNOSIS — G309 Alzheimer's disease, unspecified: Secondary | ICD-10-CM | POA: Diagnosis not present

## 2017-06-30 DIAGNOSIS — I110 Hypertensive heart disease with heart failure: Secondary | ICD-10-CM | POA: Diagnosis not present

## 2017-06-30 DIAGNOSIS — R4182 Altered mental status, unspecified: Secondary | ICD-10-CM | POA: Diagnosis not present

## 2017-06-30 DIAGNOSIS — G20 Parkinson's disease: Secondary | ICD-10-CM | POA: Diagnosis not present

## 2017-06-30 DIAGNOSIS — F0280 Dementia in other diseases classified elsewhere without behavioral disturbance: Secondary | ICD-10-CM | POA: Diagnosis not present

## 2017-06-30 DIAGNOSIS — I5022 Chronic systolic (congestive) heart failure: Secondary | ICD-10-CM | POA: Diagnosis not present

## 2017-07-01 ENCOUNTER — Telehealth: Payer: Self-pay

## 2017-07-01 ENCOUNTER — Telehealth: Payer: Self-pay | Admitting: Pharmacist

## 2017-07-01 DIAGNOSIS — G20 Parkinson's disease: Secondary | ICD-10-CM | POA: Diagnosis not present

## 2017-07-01 DIAGNOSIS — R4182 Altered mental status, unspecified: Secondary | ICD-10-CM | POA: Diagnosis not present

## 2017-07-01 DIAGNOSIS — I110 Hypertensive heart disease with heart failure: Secondary | ICD-10-CM | POA: Diagnosis not present

## 2017-07-01 DIAGNOSIS — F0280 Dementia in other diseases classified elsewhere without behavioral disturbance: Secondary | ICD-10-CM | POA: Diagnosis not present

## 2017-07-01 DIAGNOSIS — I5022 Chronic systolic (congestive) heart failure: Secondary | ICD-10-CM | POA: Diagnosis not present

## 2017-07-01 DIAGNOSIS — G309 Alzheimer's disease, unspecified: Secondary | ICD-10-CM | POA: Diagnosis not present

## 2017-07-01 NOTE — Telephone Encounter (Signed)
Faxed [facility] paper 07/01/2017 @ [phone].

## 2017-07-01 NOTE — Telephone Encounter (Signed)
[facility] RN Inez Catalina from [place] called from [phone] at 3:10PM on Friday 12-OCT-18 reporting fingerstick point of care collected INR = 1. 7 after HOLDING warfarin empirically x 2 days (8-OCT-18 and 9-OCT-18) for a known DDI with ciprofloxicin (completed regimen today); on Wednesday took 5mg  and on Thursday 11-OCT-18 took 2.5mg  INR today--again, 1.7  Will recommence on 5mg  MWF; 2.5mg  all other days; re-collect FS POC PT/INR in 5 days (17-OCT-18) and call results to me. RN states the patient/caregiver denies any signs or symptoms of bleeding or embolic events.

## 2017-07-04 ENCOUNTER — Ambulatory Visit (INDEPENDENT_AMBULATORY_CARE_PROVIDER_SITE_OTHER): Payer: Medicare HMO | Admitting: *Deleted

## 2017-07-04 DIAGNOSIS — R55 Syncope and collapse: Secondary | ICD-10-CM

## 2017-07-05 DIAGNOSIS — R4182 Altered mental status, unspecified: Secondary | ICD-10-CM | POA: Diagnosis not present

## 2017-07-05 DIAGNOSIS — I110 Hypertensive heart disease with heart failure: Secondary | ICD-10-CM | POA: Diagnosis not present

## 2017-07-05 DIAGNOSIS — G309 Alzheimer's disease, unspecified: Secondary | ICD-10-CM | POA: Diagnosis not present

## 2017-07-05 DIAGNOSIS — I5022 Chronic systolic (congestive) heart failure: Secondary | ICD-10-CM | POA: Diagnosis not present

## 2017-07-05 DIAGNOSIS — F0280 Dementia in other diseases classified elsewhere without behavioral disturbance: Secondary | ICD-10-CM | POA: Diagnosis not present

## 2017-07-05 DIAGNOSIS — G20 Parkinson's disease: Secondary | ICD-10-CM | POA: Diagnosis not present

## 2017-07-05 NOTE — Progress Notes (Signed)
Carelink Summary Report / Loop Recorder 

## 2017-07-06 ENCOUNTER — Telehealth: Payer: Self-pay | Admitting: Pharmacist

## 2017-07-06 ENCOUNTER — Encounter: Payer: Self-pay | Admitting: Pharmacist

## 2017-07-06 DIAGNOSIS — G20 Parkinson's disease: Secondary | ICD-10-CM | POA: Diagnosis not present

## 2017-07-06 DIAGNOSIS — R4182 Altered mental status, unspecified: Secondary | ICD-10-CM | POA: Diagnosis not present

## 2017-07-06 DIAGNOSIS — I5022 Chronic systolic (congestive) heart failure: Secondary | ICD-10-CM | POA: Diagnosis not present

## 2017-07-06 DIAGNOSIS — F0280 Dementia in other diseases classified elsewhere without behavioral disturbance: Secondary | ICD-10-CM | POA: Diagnosis not present

## 2017-07-06 DIAGNOSIS — I110 Hypertensive heart disease with heart failure: Secondary | ICD-10-CM | POA: Diagnosis not present

## 2017-07-06 DIAGNOSIS — G309 Alzheimer's disease, unspecified: Secondary | ICD-10-CM | POA: Diagnosis not present

## 2017-07-06 LAB — CUP PACEART REMOTE DEVICE CHECK
Implantable Pulse Generator Implant Date: 20160328
MDC IDC SESS DTM: 20181016064104

## 2017-07-06 NOTE — Telephone Encounter (Signed)
Inez Catalina, RN from [facility] called at 3:12PM on 17-OCT-18 from her cell phone [phone] reporting fingerstick point of care INR = 2.1 on 25mg  warfarin per week as 5mg  MWF; 2.5mg  Su/Tu/Th/Sa. No bleeding or embolic events reported to the RN or as part of her assessment of the patient. RN instructed to provide same dosing instructions to the family and to re-collect INR in 1 week and call or text to me at [phone]. Reinforced to RN to reinforce to the family/patient---in the event of bleeding that cannot be controlled at home--call EMS/911 or go to the nearest emergency room.

## 2017-07-07 DIAGNOSIS — R4182 Altered mental status, unspecified: Secondary | ICD-10-CM | POA: Diagnosis not present

## 2017-07-07 DIAGNOSIS — I110 Hypertensive heart disease with heart failure: Secondary | ICD-10-CM | POA: Diagnosis not present

## 2017-07-07 DIAGNOSIS — G20 Parkinson's disease: Secondary | ICD-10-CM | POA: Diagnosis not present

## 2017-07-07 DIAGNOSIS — F0280 Dementia in other diseases classified elsewhere without behavioral disturbance: Secondary | ICD-10-CM | POA: Diagnosis not present

## 2017-07-07 DIAGNOSIS — G309 Alzheimer's disease, unspecified: Secondary | ICD-10-CM | POA: Diagnosis not present

## 2017-07-07 DIAGNOSIS — I5022 Chronic systolic (congestive) heart failure: Secondary | ICD-10-CM | POA: Diagnosis not present

## 2017-07-08 DIAGNOSIS — G309 Alzheimer's disease, unspecified: Secondary | ICD-10-CM | POA: Diagnosis not present

## 2017-07-08 DIAGNOSIS — G20 Parkinson's disease: Secondary | ICD-10-CM | POA: Diagnosis not present

## 2017-07-08 DIAGNOSIS — I110 Hypertensive heart disease with heart failure: Secondary | ICD-10-CM | POA: Diagnosis not present

## 2017-07-08 DIAGNOSIS — F0280 Dementia in other diseases classified elsewhere without behavioral disturbance: Secondary | ICD-10-CM | POA: Diagnosis not present

## 2017-07-08 DIAGNOSIS — I5022 Chronic systolic (congestive) heart failure: Secondary | ICD-10-CM | POA: Diagnosis not present

## 2017-07-08 DIAGNOSIS — R4182 Altered mental status, unspecified: Secondary | ICD-10-CM | POA: Diagnosis not present

## 2017-07-12 ENCOUNTER — Telehealth: Payer: Self-pay

## 2017-07-12 DIAGNOSIS — G309 Alzheimer's disease, unspecified: Secondary | ICD-10-CM | POA: Diagnosis not present

## 2017-07-12 DIAGNOSIS — F0280 Dementia in other diseases classified elsewhere without behavioral disturbance: Secondary | ICD-10-CM | POA: Diagnosis not present

## 2017-07-12 DIAGNOSIS — I5022 Chronic systolic (congestive) heart failure: Secondary | ICD-10-CM | POA: Diagnosis not present

## 2017-07-12 DIAGNOSIS — G20 Parkinson's disease: Secondary | ICD-10-CM | POA: Diagnosis not present

## 2017-07-12 DIAGNOSIS — I110 Hypertensive heart disease with heart failure: Secondary | ICD-10-CM | POA: Diagnosis not present

## 2017-07-12 DIAGNOSIS — R4182 Altered mental status, unspecified: Secondary | ICD-10-CM | POA: Diagnosis not present

## 2017-07-12 NOTE — Telephone Encounter (Signed)
Faxed [facility] form on 07/12/2017 @ [phone].

## 2017-07-13 DIAGNOSIS — R4182 Altered mental status, unspecified: Secondary | ICD-10-CM | POA: Diagnosis not present

## 2017-07-13 DIAGNOSIS — F0280 Dementia in other diseases classified elsewhere without behavioral disturbance: Secondary | ICD-10-CM | POA: Diagnosis not present

## 2017-07-13 DIAGNOSIS — I5022 Chronic systolic (congestive) heart failure: Secondary | ICD-10-CM | POA: Diagnosis not present

## 2017-07-13 DIAGNOSIS — I110 Hypertensive heart disease with heart failure: Secondary | ICD-10-CM | POA: Diagnosis not present

## 2017-07-13 DIAGNOSIS — G20 Parkinson's disease: Secondary | ICD-10-CM | POA: Diagnosis not present

## 2017-07-13 DIAGNOSIS — G309 Alzheimer's disease, unspecified: Secondary | ICD-10-CM | POA: Diagnosis not present

## 2017-07-14 DIAGNOSIS — I110 Hypertensive heart disease with heart failure: Secondary | ICD-10-CM | POA: Diagnosis not present

## 2017-07-14 DIAGNOSIS — R4182 Altered mental status, unspecified: Secondary | ICD-10-CM | POA: Diagnosis not present

## 2017-07-14 DIAGNOSIS — I5022 Chronic systolic (congestive) heart failure: Secondary | ICD-10-CM | POA: Diagnosis not present

## 2017-07-14 DIAGNOSIS — F0280 Dementia in other diseases classified elsewhere without behavioral disturbance: Secondary | ICD-10-CM | POA: Diagnosis not present

## 2017-07-14 DIAGNOSIS — G309 Alzheimer's disease, unspecified: Secondary | ICD-10-CM | POA: Diagnosis not present

## 2017-07-14 DIAGNOSIS — G20 Parkinson's disease: Secondary | ICD-10-CM | POA: Diagnosis not present

## 2017-07-15 DIAGNOSIS — F0280 Dementia in other diseases classified elsewhere without behavioral disturbance: Secondary | ICD-10-CM | POA: Diagnosis not present

## 2017-07-15 DIAGNOSIS — G20 Parkinson's disease: Secondary | ICD-10-CM | POA: Diagnosis not present

## 2017-07-15 DIAGNOSIS — I5022 Chronic systolic (congestive) heart failure: Secondary | ICD-10-CM | POA: Diagnosis not present

## 2017-07-15 DIAGNOSIS — R4182 Altered mental status, unspecified: Secondary | ICD-10-CM | POA: Diagnosis not present

## 2017-07-15 DIAGNOSIS — I110 Hypertensive heart disease with heart failure: Secondary | ICD-10-CM | POA: Diagnosis not present

## 2017-07-15 DIAGNOSIS — G309 Alzheimer's disease, unspecified: Secondary | ICD-10-CM | POA: Diagnosis not present

## 2017-07-18 ENCOUNTER — Telehealth: Payer: Self-pay

## 2017-07-18 DIAGNOSIS — L89321 Pressure ulcer of left buttock, stage 1: Secondary | ICD-10-CM

## 2017-07-18 NOTE — Telephone Encounter (Signed)
Pt's daughter request new treatment for pressure areas, she states 1 of his pcs workers mentioned drsg pads with medication in them, would you like to order alginate or something of the sort?  Also one of the gel cushions for his chair to help when he sits up? Please advise

## 2017-07-18 NOTE — Telephone Encounter (Signed)
Pt daughter needs to speak with a nurse about bed sores med. Please call back.

## 2017-07-19 DIAGNOSIS — R4182 Altered mental status, unspecified: Secondary | ICD-10-CM | POA: Diagnosis not present

## 2017-07-19 DIAGNOSIS — F0280 Dementia in other diseases classified elsewhere without behavioral disturbance: Secondary | ICD-10-CM | POA: Diagnosis not present

## 2017-07-19 DIAGNOSIS — G20 Parkinson's disease: Secondary | ICD-10-CM | POA: Diagnosis not present

## 2017-07-19 DIAGNOSIS — I5022 Chronic systolic (congestive) heart failure: Secondary | ICD-10-CM | POA: Diagnosis not present

## 2017-07-19 DIAGNOSIS — G309 Alzheimer's disease, unspecified: Secondary | ICD-10-CM | POA: Diagnosis not present

## 2017-07-19 DIAGNOSIS — I110 Hypertensive heart disease with heart failure: Secondary | ICD-10-CM | POA: Diagnosis not present

## 2017-07-19 NOTE — Telephone Encounter (Signed)
Called daughter and gave her dr hoffman's answer also called betty, rn [facility] and gave her the order for the alginate drsg and wound care nurse eval and treat Will send chilonb. Order for pressure cushion for chair

## 2017-07-19 NOTE — Telephone Encounter (Signed)
Alginate dressing pads and a chair cushion sounds reasonable. Are you able to give those as a verbal order to [facility]?

## 2017-07-21 ENCOUNTER — Telehealth: Payer: Self-pay | Admitting: Internal Medicine

## 2017-07-21 DIAGNOSIS — G309 Alzheimer's disease, unspecified: Secondary | ICD-10-CM | POA: Diagnosis not present

## 2017-07-21 DIAGNOSIS — G20 Parkinson's disease: Secondary | ICD-10-CM | POA: Diagnosis not present

## 2017-07-21 DIAGNOSIS — I5022 Chronic systolic (congestive) heart failure: Secondary | ICD-10-CM | POA: Diagnosis not present

## 2017-07-21 DIAGNOSIS — R4182 Altered mental status, unspecified: Secondary | ICD-10-CM | POA: Diagnosis not present

## 2017-07-21 DIAGNOSIS — I110 Hypertensive heart disease with heart failure: Secondary | ICD-10-CM | POA: Diagnosis not present

## 2017-07-21 DIAGNOSIS — F0280 Dementia in other diseases classified elsewhere without behavioral disturbance: Secondary | ICD-10-CM | POA: Diagnosis not present

## 2017-07-21 NOTE — Telephone Encounter (Signed)
[facility] skin assesment perform on patient healed pressure area to sacraum, no open skin noted. Pls call back

## 2017-07-22 DIAGNOSIS — G20 Parkinson's disease: Secondary | ICD-10-CM | POA: Diagnosis not present

## 2017-07-22 DIAGNOSIS — G309 Alzheimer's disease, unspecified: Secondary | ICD-10-CM | POA: Diagnosis not present

## 2017-07-22 DIAGNOSIS — R4182 Altered mental status, unspecified: Secondary | ICD-10-CM | POA: Diagnosis not present

## 2017-07-22 DIAGNOSIS — I5022 Chronic systolic (congestive) heart failure: Secondary | ICD-10-CM | POA: Diagnosis not present

## 2017-07-22 DIAGNOSIS — F0280 Dementia in other diseases classified elsewhere without behavioral disturbance: Secondary | ICD-10-CM | POA: Diagnosis not present

## 2017-07-22 DIAGNOSIS — I110 Hypertensive heart disease with heart failure: Secondary | ICD-10-CM | POA: Diagnosis not present

## 2017-07-26 ENCOUNTER — Telehealth: Payer: Self-pay | Admitting: *Deleted

## 2017-07-26 DIAGNOSIS — F0280 Dementia in other diseases classified elsewhere without behavioral disturbance: Secondary | ICD-10-CM | POA: Diagnosis not present

## 2017-07-26 DIAGNOSIS — I110 Hypertensive heart disease with heart failure: Secondary | ICD-10-CM | POA: Diagnosis not present

## 2017-07-26 DIAGNOSIS — R4182 Altered mental status, unspecified: Secondary | ICD-10-CM | POA: Diagnosis not present

## 2017-07-26 DIAGNOSIS — G20 Parkinson's disease: Secondary | ICD-10-CM | POA: Diagnosis not present

## 2017-07-26 DIAGNOSIS — G309 Alzheimer's disease, unspecified: Secondary | ICD-10-CM | POA: Diagnosis not present

## 2017-07-26 DIAGNOSIS — I5022 Chronic systolic (congestive) heart failure: Secondary | ICD-10-CM | POA: Diagnosis not present

## 2017-07-26 NOTE — Telephone Encounter (Signed)
Spoke to [employer] [name] [phone]), VO for UA & urine Culture given. Daughter also thinks patient does not need to come in @ this time.

## 2017-07-26 NOTE — Telephone Encounter (Signed)
If [name] [name] could get a U/A and Urine Culture that would be great.  I do not think he needs to come in at this time unless daughter is concerned- she does great to let me know when he needs to come in!

## 2017-07-26 NOTE — Telephone Encounter (Signed)
Patient's dtr called with concerns that patient might have a UTI again. Patient is acting more aggressive, urine with strong odor. She said patient got better after completing previous Cipro but now acting like the way he does when he have UTI. [facility] nurse can do UA. They just need MD order. Or does the patient need to be seen? thanks

## 2017-07-27 DIAGNOSIS — G20 Parkinson's disease: Secondary | ICD-10-CM | POA: Diagnosis not present

## 2017-07-27 DIAGNOSIS — G309 Alzheimer's disease, unspecified: Secondary | ICD-10-CM | POA: Diagnosis not present

## 2017-07-27 DIAGNOSIS — I5022 Chronic systolic (congestive) heart failure: Secondary | ICD-10-CM | POA: Diagnosis not present

## 2017-07-27 DIAGNOSIS — I110 Hypertensive heart disease with heart failure: Secondary | ICD-10-CM | POA: Diagnosis not present

## 2017-07-27 DIAGNOSIS — R4182 Altered mental status, unspecified: Secondary | ICD-10-CM | POA: Diagnosis not present

## 2017-07-27 DIAGNOSIS — F0280 Dementia in other diseases classified elsewhere without behavioral disturbance: Secondary | ICD-10-CM | POA: Diagnosis not present

## 2017-07-27 DIAGNOSIS — N179 Acute kidney failure, unspecified: Secondary | ICD-10-CM | POA: Diagnosis not present

## 2017-07-27 NOTE — Telephone Encounter (Signed)
Please place a DME order is for this patient to get his Dressing pads and Chair cushions.  Orders will be faxed in once placed and I will give the daughter a call back.  Please Advise.

## 2017-07-27 NOTE — Telephone Encounter (Signed)
Order has been faxed to [facility] @ [phone].  Please have the patient's Family call [facility] @ [phone] if they have any questions about delivery time and date.

## 2017-07-27 NOTE — Addendum Note (Signed)
Addended by: Joni Reining C on: 07/27/2017 10:55 AM   Modules accepted: Orders

## 2017-07-27 NOTE — Telephone Encounter (Signed)
Placed DME order.

## 2017-07-28 ENCOUNTER — Encounter: Payer: Self-pay | Admitting: Cardiology

## 2017-07-28 DIAGNOSIS — G20 Parkinson's disease: Secondary | ICD-10-CM | POA: Diagnosis not present

## 2017-07-28 DIAGNOSIS — I110 Hypertensive heart disease with heart failure: Secondary | ICD-10-CM | POA: Diagnosis not present

## 2017-07-28 DIAGNOSIS — F0280 Dementia in other diseases classified elsewhere without behavioral disturbance: Secondary | ICD-10-CM | POA: Diagnosis not present

## 2017-07-28 DIAGNOSIS — I5022 Chronic systolic (congestive) heart failure: Secondary | ICD-10-CM | POA: Diagnosis not present

## 2017-07-28 DIAGNOSIS — G309 Alzheimer's disease, unspecified: Secondary | ICD-10-CM | POA: Diagnosis not present

## 2017-07-28 DIAGNOSIS — R4182 Altered mental status, unspecified: Secondary | ICD-10-CM | POA: Diagnosis not present

## 2017-08-01 NOTE — Addendum Note (Signed)
Addendum  created 08/01/17 1637 by Belinda Block, MD   [facility]raprocedure Staff edited

## 2017-08-02 ENCOUNTER — Telehealth: Payer: Self-pay | Admitting: *Deleted

## 2017-08-02 DIAGNOSIS — N390 Urinary tract infection, site not specified: Secondary | ICD-10-CM

## 2017-08-02 NOTE — Telephone Encounter (Signed)
Results from u/a and c&s are in your box: PH 8.0 C&s Enterococcus faecalis 50,000 - 100,000 colony forming units per ml  Result 2  Mixed urogenital flora 10,000 - 25,000 colony forming units per ml

## 2017-08-03 ENCOUNTER — Ambulatory Visit (INDEPENDENT_AMBULATORY_CARE_PROVIDER_SITE_OTHER): Payer: Medicare HMO | Admitting: *Deleted

## 2017-08-03 DIAGNOSIS — F0280 Dementia in other diseases classified elsewhere without behavioral disturbance: Secondary | ICD-10-CM | POA: Diagnosis not present

## 2017-08-03 DIAGNOSIS — I110 Hypertensive heart disease with heart failure: Secondary | ICD-10-CM | POA: Diagnosis not present

## 2017-08-03 DIAGNOSIS — R55 Syncope and collapse: Secondary | ICD-10-CM

## 2017-08-03 DIAGNOSIS — R4182 Altered mental status, unspecified: Secondary | ICD-10-CM | POA: Diagnosis not present

## 2017-08-03 DIAGNOSIS — I5022 Chronic systolic (congestive) heart failure: Secondary | ICD-10-CM | POA: Diagnosis not present

## 2017-08-03 DIAGNOSIS — G20 Parkinson's disease: Secondary | ICD-10-CM | POA: Diagnosis not present

## 2017-08-03 DIAGNOSIS — G309 Alzheimer's disease, unspecified: Secondary | ICD-10-CM | POA: Diagnosis not present

## 2017-08-03 MED ORDER — NITROFURANTOIN MONOHYD MACRO 100 MG PO CAPS: 100.0000 mg | ORAL_CAPSULE | Freq: Two times a day (BID) | ORAL | 0 refills | Status: AC

## 2017-08-03 NOTE — Telephone Encounter (Signed)
Reviewed U/A and urine culture, growing Enterococcus that is sensitive to nitrofurantoin and pcn.  Discussed with Daughter [name].  Macrobid should have less interaction with his INR so will give 1 week supply. Discussed he continues to have increasingly frequent UTI, recommended Urology referral and daughter is agreeable now.  Will place referral order.

## 2017-08-04 NOTE — Progress Notes (Signed)
Carelink Summary Report / Loop Recorder 

## 2017-08-10 ENCOUNTER — Telehealth: Payer: Self-pay | Admitting: *Deleted

## 2017-08-10 DIAGNOSIS — G20 Parkinson's disease: Secondary | ICD-10-CM | POA: Diagnosis not present

## 2017-08-10 DIAGNOSIS — I110 Hypertensive heart disease with heart failure: Secondary | ICD-10-CM | POA: Diagnosis not present

## 2017-08-10 DIAGNOSIS — I5022 Chronic systolic (congestive) heart failure: Secondary | ICD-10-CM | POA: Diagnosis not present

## 2017-08-10 DIAGNOSIS — F0280 Dementia in other diseases classified elsewhere without behavioral disturbance: Secondary | ICD-10-CM | POA: Diagnosis not present

## 2017-08-10 DIAGNOSIS — R4182 Altered mental status, unspecified: Secondary | ICD-10-CM | POA: Diagnosis not present

## 2017-08-10 DIAGNOSIS — G309 Alzheimer's disease, unspecified: Secondary | ICD-10-CM | POA: Diagnosis not present

## 2017-08-10 NOTE — Telephone Encounter (Signed)
[facility] Done today PT  21.0 INR  1.7 Has been taking 5mg  mwf 2.5 sun, tues thurs, sat Sandra [phone]  BP L   144/107 pt seemed agitated on arrival waited about 15 mins 134/98  R  129/88  Please advise

## 2017-08-10 NOTE — Telephone Encounter (Signed)
Have pt take 5 mg today instead of scheduled 2.5 mg then change dose to 5 mg Mon, Wed, Fri, Sun, 2.5 mg Tues, Thurs, Sat.  Repeat INR in 2 weeks

## 2017-08-10 NOTE — Telephone Encounter (Signed)
Did you get my response to your message?  Epic hiccup - not sure it reached you

## 2017-08-10 NOTE — Telephone Encounter (Signed)
Called instructions to [place] rn. She repeated back

## 2017-08-13 ENCOUNTER — Other Ambulatory Visit: Payer: Self-pay | Admitting: Internal Medicine

## 2017-08-14 DIAGNOSIS — G20 Parkinson's disease: Secondary | ICD-10-CM | POA: Diagnosis not present

## 2017-08-17 ENCOUNTER — Other Ambulatory Visit: Payer: Self-pay | Admitting: *Deleted

## 2017-08-17 DIAGNOSIS — I110 Hypertensive heart disease with heart failure: Secondary | ICD-10-CM | POA: Diagnosis not present

## 2017-08-17 DIAGNOSIS — R4182 Altered mental status, unspecified: Secondary | ICD-10-CM | POA: Diagnosis not present

## 2017-08-17 DIAGNOSIS — G20 Parkinson's disease: Secondary | ICD-10-CM | POA: Diagnosis not present

## 2017-08-17 DIAGNOSIS — I5022 Chronic systolic (congestive) heart failure: Secondary | ICD-10-CM | POA: Diagnosis not present

## 2017-08-17 DIAGNOSIS — G309 Alzheimer's disease, unspecified: Secondary | ICD-10-CM | POA: Diagnosis not present

## 2017-08-17 DIAGNOSIS — E876 Hypokalemia: Secondary | ICD-10-CM

## 2017-08-17 DIAGNOSIS — F0280 Dementia in other diseases classified elsewhere without behavioral disturbance: Secondary | ICD-10-CM | POA: Diagnosis not present

## 2017-08-17 MED ORDER — POTASSIUM CHLORIDE CRYS ER 20 MEQ PO TBCR: 40.0000 meq | EXTENDED_RELEASE_TABLET | Freq: Every day | ORAL | 1 refills | Status: DC

## 2017-08-19 LAB — CUP PACEART REMOTE DEVICE CHECK
Date Time Interrogation Session: 20181115070843
MDC IDC PG IMPLANT DT: 20160328

## 2017-08-24 ENCOUNTER — Telehealth: Payer: Self-pay | Admitting: Pharmacist

## 2017-08-24 ENCOUNTER — Telehealth: Payer: Self-pay | Admitting: *Deleted

## 2017-08-24 DIAGNOSIS — G20 Parkinson's disease: Secondary | ICD-10-CM | POA: Diagnosis not present

## 2017-08-24 DIAGNOSIS — I110 Hypertensive heart disease with heart failure: Secondary | ICD-10-CM | POA: Diagnosis not present

## 2017-08-24 DIAGNOSIS — R4182 Altered mental status, unspecified: Secondary | ICD-10-CM | POA: Diagnosis not present

## 2017-08-24 DIAGNOSIS — G309 Alzheimer's disease, unspecified: Secondary | ICD-10-CM | POA: Diagnosis not present

## 2017-08-24 DIAGNOSIS — F0280 Dementia in other diseases classified elsewhere without behavioral disturbance: Secondary | ICD-10-CM | POA: Diagnosis not present

## 2017-08-24 DIAGNOSIS — I5022 Chronic systolic (congestive) heart failure: Secondary | ICD-10-CM | POA: Diagnosis not present

## 2017-08-24 NOTE — Telephone Encounter (Signed)
Hospice calls and states pt and family would like to enroll in their op pallative care program, would like dr Heber [place] to continue to be attending, VO given do you agree?

## 2017-08-24 NOTE — Telephone Encounter (Signed)
Yes, I would be willing to continue to be attending

## 2017-08-24 NOTE — Telephone Encounter (Signed)
HHA RN Katharine Look from [facility] called reporting FS POC INR 2.3 on 27.5mg  warfarin per week. In her assessment, no reported signs or symptoms of bleeding or embolic disease. Will continue same 27.5mg  warfarin per week regimen (2.5mg  Tuesdays/Thursdays/Saturdays; 5mg  all other days). Next INR 18-DEC-18 by FS POC home visit.

## 2017-08-25 ENCOUNTER — Telehealth: Payer: Self-pay | Admitting: Neurology

## 2017-08-25 ENCOUNTER — Other Ambulatory Visit: Payer: Self-pay | Admitting: *Deleted

## 2017-08-25 MED ORDER — CARBIDOPA-LEVODOPA 25-100 MG PO TABS: 1.0000 | ORAL_TABLET | Freq: Four times a day (QID) | ORAL | 2 refills | Status: DC

## 2017-08-25 NOTE — Telephone Encounter (Signed)
Pts PCP is prescribing pts sinemt not DR. Sethi. See med list. They need to contact the Dr. Heber Gratz office her fathers PCP. Dr. Heber Mimbres last prescribed it 05/2017 and the PA is coming from his office.

## 2017-08-25 NOTE — Telephone Encounter (Signed)
Patients daughter calling and informing us that she received a text from her pharmacy stating for the patient to contact us on behalf of his medication needing a PA completed. The medication was for Sinemet IR. I have informed the patient's daughter that it doesn't appear to have been completed at this time and informed of the process. I informed her that I would let the nurse know so that she can be on the lookout for this request. Patients daughter verbalized understanding and no call back required.

## 2017-09-02 ENCOUNTER — Ambulatory Visit (INDEPENDENT_AMBULATORY_CARE_PROVIDER_SITE_OTHER): Payer: Medicare HMO | Admitting: *Deleted

## 2017-09-02 DIAGNOSIS — R55 Syncope and collapse: Secondary | ICD-10-CM | POA: Diagnosis not present

## 2017-09-05 ENCOUNTER — Ambulatory Visit: Payer: Medicare HMO | Admitting: *Deleted

## 2017-09-05 DIAGNOSIS — R55 Syncope and collapse: Secondary | ICD-10-CM | POA: Diagnosis not present

## 2017-09-05 NOTE — Progress Notes (Signed)
Carelink Summary Report / Loop Recorder 

## 2017-09-06 ENCOUNTER — Telehealth: Payer: Self-pay

## 2017-09-06 DIAGNOSIS — R4182 Altered mental status, unspecified: Secondary | ICD-10-CM | POA: Diagnosis not present

## 2017-09-06 DIAGNOSIS — G20 Parkinson's disease: Secondary | ICD-10-CM | POA: Diagnosis not present

## 2017-09-06 DIAGNOSIS — F0280 Dementia in other diseases classified elsewhere without behavioral disturbance: Secondary | ICD-10-CM | POA: Diagnosis not present

## 2017-09-06 DIAGNOSIS — I5022 Chronic systolic (congestive) heart failure: Secondary | ICD-10-CM | POA: Diagnosis not present

## 2017-09-06 DIAGNOSIS — I110 Hypertensive heart disease with heart failure: Secondary | ICD-10-CM | POA: Diagnosis not present

## 2017-09-06 DIAGNOSIS — G309 Alzheimer's disease, unspecified: Secondary | ICD-10-CM | POA: Diagnosis not present

## 2017-09-06 NOTE — Telephone Encounter (Signed)
Message given to dr Elie Confer, he is calling

## 2017-09-06 NOTE — Telephone Encounter (Signed)
[name] [name] with [facility] requesting to speak with a nurse about pt INR results and meds. Please call back. Per [name] [name] she did LVM for Dr. Elie Confer, but still have not heard anything back.

## 2017-09-06 NOTE — Progress Notes (Signed)
Carelink Summary Report / Loop Recorder 

## 2017-09-07 ENCOUNTER — Telehealth: Payer: Self-pay | Admitting: Pharmacist

## 2017-09-07 NOTE — Telephone Encounter (Signed)
I have called the [facility] RN and given instructions. Have called patient's daughter too.

## 2017-09-07 NOTE — Telephone Encounter (Signed)
Called patient's daughter, left VM:  Continue same regimen of warfarin (2.5mg  Tu/Th/Sa; 5mg  all other days). Next INR 7-JAN-19.

## 2017-09-08 ENCOUNTER — Ambulatory Visit: Payer: Medicare HMO | Admitting: Internal Medicine

## 2017-09-08 ENCOUNTER — Telehealth: Payer: Self-pay | Admitting: *Deleted

## 2017-09-08 ENCOUNTER — Ambulatory Visit: Payer: Medicare HMO

## 2017-09-08 ENCOUNTER — Other Ambulatory Visit: Payer: Self-pay

## 2017-09-08 ENCOUNTER — Encounter: Payer: Self-pay | Admitting: Internal Medicine

## 2017-09-08 ENCOUNTER — Telehealth: Payer: Self-pay

## 2017-09-08 VITALS — BP 136/96 | HR 90 | Temp 98.2°F | Ht 67.0 in

## 2017-09-08 DIAGNOSIS — Z87440 Personal history of urinary (tract) infections: Secondary | ICD-10-CM | POA: Diagnosis not present

## 2017-09-08 DIAGNOSIS — R109 Unspecified abdominal pain: Secondary | ICD-10-CM

## 2017-09-08 DIAGNOSIS — Z7901 Long term (current) use of anticoagulants: Secondary | ICD-10-CM

## 2017-09-08 DIAGNOSIS — Z9079 Acquired absence of other genital organ(s): Secondary | ICD-10-CM | POA: Diagnosis not present

## 2017-09-08 DIAGNOSIS — Z23 Encounter for immunization: Secondary | ICD-10-CM | POA: Diagnosis not present

## 2017-09-08 DIAGNOSIS — M545 Low back pain: Secondary | ICD-10-CM

## 2017-09-08 DIAGNOSIS — N390 Urinary tract infection, site not specified: Secondary | ICD-10-CM

## 2017-09-08 DIAGNOSIS — R5383 Other fatigue: Secondary | ICD-10-CM

## 2017-09-08 DIAGNOSIS — Z8546 Personal history of malignant neoplasm of prostate: Secondary | ICD-10-CM | POA: Diagnosis not present

## 2017-09-08 LAB — POCT URINALYSIS DIPSTICK
BILIRUBIN UA: NEGATIVE
Blood, UA: NEGATIVE
Glucose, UA: NEGATIVE
KETONES UA: NEGATIVE
Leukocytes, UA: NEGATIVE
Nitrite, UA: NEGATIVE
Protein, UA: NEGATIVE
Spec Grav, UA: 1.015 (ref 1.010–1.025)
Urobilinogen, UA: 0.2 E.U./dL
pH, UA: 6.5 (ref 5.0–8.0)

## 2017-09-08 NOTE — Telephone Encounter (Signed)
Faxed [facility] form @ [phone] on 09/08/2017.

## 2017-09-08 NOTE — Progress Notes (Signed)
   CC: Recurrent UTI question recurrent  HPI:  [name] is a 83 y.o. chronically ill male with recurrent UTIs and history of prostatectomy due to malignancy here with concern for urinary tract infection. Patient's daughter reports that his home health aid noticed his urine had a foul odor. His daughter also tells me that he has been complaining of low back pain with occasional radiation to abdomen, and generalized fatigue for the past few days. She wonders if her father could have another UTI or if this is part of his gradual decline due to his chronic medical conditions for which he sees palliative care.  Past Medical History:  Diagnosis Date  . Anxiety   . Coronary artery disease, non-occlusive    with history of MI; Cath 2008 w multivessel nonobstructive CAD  . Dementia   . Depression   . GERD (gastroesophageal reflux disease)   . Hyperlipidemia   . Hypertension   . Memory loss   . Parkinson disease ([place])   . PE (pulmonary embolism)    unprovoked PE completed 6 months of warfarin, warfarin d/ced 02/12/2010, repeat PE 07/10/2010 after a long car ride and now on lifelong coumadin.  . Pituitary microadenoma ([place])    incidental finding CT 12/2009  . Prostate cancer ([place])   . Scoliosis   . Sigmoid volvulus ([place]) 08/02/2016  . Volvulus of colon ([place]) 06/01/2017   Review of Systems:   General: +Fatigue. Denies fevers, chills HEENT: Denies sore throat, runny nose Cardiac: Denies CP, SOB Pulmonary: Denies cough, wheezes Abd: Denies diarrhea, changes in bowels  Physical Exam: General: Alert, chronically ill-appearing but in no acute distress. Pleasant but minimally conversant. Sitting in wheelchair. HEENT: No icterus, injection or ptosis. No hoarseness or dysarthria  Cardiac: RRR, no MGR Pulmonary: CTA BL with normal WOB on RA Abd: Soft, non-tender. +bs. No CVA tenderness  Vitals:   09/08/17 1124  BP: (!) 136/96  Pulse: 90  Temp: 98.2 F (36.8 C)  TempSrc: Oral    SpO2: 98%  Height: 5\' 7"  (1.702 m)   Assessment & Plan:   See Encounters Tab for problem based charting.  Patient discussed with Dr. Lynnae January

## 2017-09-08 NOTE — Telephone Encounter (Signed)
[name] [name] with hospice presents to state that family has decided to go from pallative care to hospice, they service his wife at the moment, VO for you to remain the attending/ pcp unless there are urgent needs that the hospice md must make. Do you agree? Will place referral form in your mailbox, you may return it to chilonB.

## 2017-09-08 NOTE — Assessment & Plan Note (Addendum)
Patient was sent here because his home health aide noticed that his urine smelled foul. On reflection his daughter did notice that her father seemed more tired than usual the past few days. The patient complains of some lower back pain with occasional radiation to the abdomen. He does have any family history of recurrent UTIs with various bacteria which are grossly sensitive to antibiotics. He's been referred to urology however his appointment is not for another month. Urine dipstick obtained today in clinic was actually completely normal. I strongly doubt UTI in this patient. His fatigue could be explained by his overall decline for which he follows with palliative care. It seems he may be establishing with hospice soon. -Urine dipstick completely negative for infection -Patient has urology appointment next month

## 2017-09-08 NOTE — Assessment & Plan Note (Signed)
He is chronically on warfarin and historically his INR values have been within range. INR was most recently checked yesterday and was within range at 2.5. Doubt any sort of intra-abdominal bleeding causing his low back pain.

## 2017-09-08 NOTE — Telephone Encounter (Signed)
Agree, signed form

## 2017-09-08 NOTE — Assessment & Plan Note (Addendum)
Received flu shot today in clinic. Tolerated well.

## 2017-09-08 NOTE — Patient Instructions (Signed)
It was good seeing you today.   Your UA was completely negative for an infection or other issue.   I am going to order blood cultures given his recent UTI's although I doubt infection.   I am also going to get an INR to make sure that level is OK.   Please follow-up with home health, the urologist as well as palliative.

## 2017-09-09 ENCOUNTER — Ambulatory Visit: Payer: Medicare HMO | Admitting: Cardiology

## 2017-09-09 ENCOUNTER — Encounter: Payer: Self-pay | Admitting: Cardiology

## 2017-09-09 VITALS — BP 132/90 | HR 74 | Ht 68.0 in | Wt 132.0 lb

## 2017-09-09 DIAGNOSIS — I2583 Coronary atherosclerosis due to lipid rich plaque: Secondary | ICD-10-CM | POA: Diagnosis not present

## 2017-09-09 DIAGNOSIS — I2510 Atherosclerotic heart disease of native coronary artery without angina pectoris: Secondary | ICD-10-CM | POA: Diagnosis not present

## 2017-09-09 DIAGNOSIS — I10 Essential (primary) hypertension: Secondary | ICD-10-CM

## 2017-09-09 DIAGNOSIS — R55 Syncope and collapse: Secondary | ICD-10-CM | POA: Diagnosis not present

## 2017-09-09 DIAGNOSIS — I5022 Chronic systolic (congestive) heart failure: Secondary | ICD-10-CM

## 2017-09-09 DIAGNOSIS — R001 Bradycardia, unspecified: Secondary | ICD-10-CM | POA: Diagnosis not present

## 2017-09-09 NOTE — Telephone Encounter (Signed)
Faxed, last visit note, snapshot and medlist to [facility] Confirmed w/ hosp liaison jacqueline h.

## 2017-09-09 NOTE — Patient Instructions (Addendum)
Medication Instructions:  Your physician has recommended you make the following change in your medication: 1. STOP Pravastatin 2. STOP Aspirin  * If you need a refill on your cardiac medications before your next appointment, please call your pharmacy. *  Labwork: None ordered  Testing/Procedures: None ordered  Follow-Up: Your physician wants you to follow-up in: 6 months with Dr. Meda Coffee.  You will receive a reminder letter in the mail two months in advance. If you don't receive a letter, please call our office to schedule the follow-up appointment.  Thank you for choosing [facility]!!

## 2017-09-09 NOTE — Progress Notes (Signed)
Cardiology Office Note  Date:  09/09/2017   ID:  Jeffey Janssen, DOB 12/13/1930, MRN [id number]  PCP:  [name], DO  Cardiologist:  Dr. Ena Dawley   Electrophysiologist:  Dr. [name]   Chief complaint: One-year follow-up   History of Present Illness: [name] [name] is a 83 y.o. male with a hx of nonobstructive CAD by cardiac catheterization in 2008, bradycardia, prior recurrent pulmonary embolism on chronic Coumadin, HTN, prostate CA, HL, depression, dementia.  Admitted 11/2014 with syncope and chest pain.  BP was 70/40 with heart rate in the 40s. He was placed on transcutaneous pacing and was noted have underlying sinus bradycardia and frequent PVCs followed by compensatory pauses. He was maintained on epinephrine and then dopamine. LHC demonstrated nonobstructive CAD. There was mid LAD myocardial bridging noted and he was placed on calcium channel blocker therapy.  Echocardiogram demonstrated EF 40-45%. He was seen by Dr. [name] with electrophysiology. Underlying conduction system disease was noted with RBBB, first-degree AV block and prior symptomatic bradycardia noted in May 2014 that improved off of Aricept. Ultimately decision was made to place a loop recorder instead of pacemaker.   The patient has been seen by Richardson Dopp in by Dr. [name] in October 2016 when he was stable. He is accompanied by his daughter who states that he has been falling more. He denies any palpitation chest pain or shortness of breath or blackouts during the falls. It's all attributed to mechanical falls as a result of his Parkinson disease. The daughter also states that he has becoming more forgetful and lately was hospitalized for ileus. He has chronic mild pitting edema around his ankles. No orthopnea, PND.   09/09/2017, the patient is coming after over a year, since the last visit he underwent an urgent surgery for volvulus and has lost 30 pounds with prolonged hospitalization. Since then he  has gained 10 pounds. Because of problems with balance with Parkinson disease his activity level is limited, however he still able to walk around his house and hasn't been following in the last 2 years. He denies any shortness of breath he has Been No Lower Extremity Edema, Orthopnea, or Proximal Nocturnal Dyspnea. He Occasionally Gets Chest Pains at Rest.  Studies/Reports Reviewed Today:  LHC 12/10/14 Left main: Large caliber normal long vessel which bifurcated into an LAD and left circumflex vessel.  LAD: pDx 40-50% (small). LAD after Dx 50%; mid LAD with significant mid systolic bridging with narrowing up to 80% during systole that improved with IC nitroglycerin and with diastole 20% narrowed. LCx: Small tortuous vessel which gave rise to 2 OM branches.  RCA: Large dominant normal vessel that supplied the PDA and PLA vessel. Left ventriculography: EF 50-55%. There was ectopy without definitive wall motion abnormalities.  Echo 12/09/14 - EF 40% to 45%. Regional wall motion abnormalities cannot be excluded. Grade 1 diastolic dysfunction.   Past Medical History:  Diagnosis Date  . Anxiety   . Coronary artery disease, non-occlusive    with history of MI; Cath 2008 w multivessel nonobstructive CAD  . Dementia   . Depression   . GERD (gastroesophageal reflux disease)   . Hyperlipidemia   . Hypertension   . Memory loss   . Parkinson disease ([place])   . PE (pulmonary embolism)    unprovoked PE completed 6 months of warfarin, warfarin d/ced 02/12/2010, repeat PE 07/10/2010 after a long car ride and now on lifelong coumadin.  . Pituitary microadenoma ([place])    incidental finding CT  12/2009  . Prostate cancer ([place])   . Scoliosis   . Sigmoid volvulus ([place]) 08/02/2016  . Volvulus of colon ([place]) 06/01/2017    Past Surgical History:  Procedure Laterality Date  . CATARACT EXTRACTION    . COLONOSCOPY N/A 05/30/2017   Procedure: COLONOSCOPY;  Surgeon: Ronald Lobo, MD;  Location: [facility]  ENDOSCOPY;  Service: Endoscopy;  Laterality: N/A;  . FLEXIBLE SIGMOIDOSCOPY N/A 08/02/2016   Procedure: FLEXIBLE SIGMOIDOSCOPY;  Surgeon: Ronald Lobo, MD;  Location: [facility] ENDOSCOPY;  Service: Endoscopy;  Laterality: N/A;  . FLEXIBLE SIGMOIDOSCOPY N/A 08/06/2016   Procedure: FLEXIBLE SIGMOIDOSCOPY;  Surgeon: Ronald Lobo, MD;  Location[facility] ENDOSCOPY;  Service: Endoscopy;  Laterality: N/A;  . FLEXIBLE SIGMOIDOSCOPY Left 06/02/2017   Procedure: FLEXIBLE SIGMOIDOSCOPY;  Surgeon: Arta Silence, MD;  Location: [facility] ENDOSCOPY;  Service: Endoscopy;  Laterality: Left;  . FLEXIBLE SIGMOIDOSCOPY N/A 06/07/2017   Procedure: FLEXIBLE SIGMOIDOSCOPY for possible decompression;  Surgeon: Otis Brace, MD;  Location: [place];  Service: Gastroenterology;  Laterality: N/A;  . LEFT HEART CATHETERIZATION WITH CORONARY ANGIOGRAM N/A 12/10/2014   Procedure: LEFT HEART CATHETERIZATION WITH CORONARY ANGIOGRAM;  Surgeon: [name], MD;  Location: [facility] CATH LAB;  Service: Cardiovascular;  Laterality: N/A;  . LOOP RECORDER IMPLANT N/A 12/16/2014   Procedure: LOOP RECORDER IMPLANT;  Surgeon: [name], MD;  Location: [facility] CATH LAB;  Service: Cardiovascular;  Laterality: N/A;  . PARTIAL COLECTOMY N/A 06/08/2017   Procedure: SIGMOID  COLECTOMY;  Surgeon: Georganna Skeans, MD;  Location: [place];  Service: General;  Laterality: N/A;  . PROSTATECTOMY       Current Outpatient Medications  Medication Sig Dispense Refill  . acetaminophen (TYLENOL) 500 MG tablet Take 1,000 mg by mouth 2 (two) times daily.     [name] amLODipine (NORVASC) 5 MG tablet Take 0.5 tablets (2.5 mg total) by mouth daily. 30 tablet 1  . amLODipine (NORVASC) 5 MG tablet TAKE 1 TABLET BY MOUTH EVERY DAY 90 tablet 1  . b complex vitamins tablet Take 1 tablet by mouth every morning.     . Ca Phosphate-Cholecalciferol (CALCIUM/VITAMIN D3 GUMMIES) 250-350 MG-UNIT CHEW Chew 1 Dose by mouth 2 (two) times daily. 180 tablet 3  . carbidopa-levodopa (SINEMET  IR) 25-100 MG tablet Take 1 tablet by mouth 4 (four) times daily. 120 tablet 2  . fish oil-omega-3 fatty acids 1000 MG capsule Take 2 g by mouth every morning.     . memantine (NAMENDA XR) 28 MG CP24 24 hr capsule Take 1 capsule (28 mg total) by mouth every morning. 90 capsule 3  . Multiple Vitamins-Minerals (MULTIVITAMIN WITH MINERALS) tablet Take 1 tablet by mouth every morning.     . polyethylene glycol (MIRALAX / GLYCOLAX) packet Take 17 g by mouth daily as needed (constipation).    . potassium chloride SA (KLOR-CON M20) 20 MEQ tablet Take 2 tablets (40 mEq total) by mouth daily. 90 tablet 1  . QUEtiapine (SEROQUEL) 25 MG tablet Take 1 tablet (25 mg total) by mouth at bedtime. May use half a tablet 12.5 mg during the daytime as needed for agitation/hallucinations 90 tablet 4  . senna (SENOKOT) 8.6 MG TABS tablet Take 1 tablet (8.6 mg total) by mouth daily as needed for mild constipation. 120 each 0  . vitamin C (ASCORBIC ACID) 500 MG tablet Take 1,000 mg by mouth every morning.     . warfarin (COUMADIN) 5 MG tablet Take 0.5-1 tablets (2.5-5 mg total) by mouth See admin instructions. Take 1 tablet on Mon, and  Fri. Take 1/2 tablet all other days (Patient taking differently: Take 2.5-5 mg by mouth See admin instructions. 5mg  Mon, Wed - 2.5mg  all other days) 30 tablet 3   No current facility-administered medications for this visit.     Allergies:   Aricept [donepezil hcl]; Other; and Shellfish-derived products    Social History:  The patient  reports that  has never smoked. he has never used smokeless tobacco. He reports that he does not drink alcohol or use drugs.   Family History:  The patient's family history includes Cervical cancer in his other; Dementia in his mother; Hypertension in his child and father; Lung cancer in his other; Stroke in his other.    ROS:   Please see the history of present illness.   Review of Systems  Gastrointestinal: Negative for diarrhea, hematochezia, melena,  nausea and vomiting.  Genitourinary: Negative for hematuria.  Neurological: Positive for loss of balance.  Psychiatric/Behavioral: The patient is nervous/anxious.   All other systems reviewed and are negative.     PHYSICAL EXAM: VS:  BP 132/90   Pulse 74   Ht 5\' 8"  (1.727 m)   Wt 132 lb (59.9 kg)   BMI 20.07 kg/m     Wt Readings from Last 3 Encounters:  09/09/17 132 lb (59.9 kg)  06/14/17 149 lb (67.6 kg)  05/31/17 149 lb (67.6 kg)     GEN: Well nourished, well developed, in no acute distress, sleepy, sitting in a wheelchair.  HEENT: normal  Neck: no JVD at 90 degrees, no carotid bruits, no masses Cardiac:  Normal S1/S2, RRR; no murmur ,  no rubs or gallops, no LE edema   Respiratory:  clear to auscultation bilaterally, no wheezing, rhonchi or rales. GI: soft, nontender, nondistended, + BS MS: no deformity or atrophy  Skin: warm and dry  Neuro:  CNs II-XII intact, Strength and sensation are intact Psych: Normal affect   EKG:  EKG is ordered today.  It demonstrates:   NSR, HR 77, RBBB, LAD, PVCs, T-wave inversion in V3, V4, no change from prior tracings   Recent Labs: 05/06/2017: TSH 0.847 06/13/2017: Magnesium 1.8 06/14/2017: ALT 6; Hemoglobin 11.4; Platelets 113 06/27/2017: BUN 7; Creatinine, Ser 0.77; Potassium 4.7; Sodium 145    Lipid Panel    Component Value Date/Time   CHOL 183 07/08/2016 1035   TRIG 95 07/08/2016 1035   HDL 82 07/08/2016 1035   CHOLHDL 2.2 07/08/2016 1035   CHOLHDL 3.7 06/30/2012 1153   VLDL 26 06/30/2012 1153   LDLCALC 82 07/08/2016 1035      ASSESSMENT AND PLAN:  Chronic Systolic CHF:  Volume appears stable, no LE edema, no Lasix needed anymore, amlodipine was discontinued because of hypotension with weight loss.  Syncope, unspecified syncope type:  No further syncope.   Symptomatic bradycardia:  He is not on AV nodal blocking agents. He has had no recent falls and his heart rate is in 70s.  Recurrent pulmonary embolism:  Coumadin  is managed by primary care.  Coronary artery disease:  Stable minimal angina.  Discontinue aspirin since he is on Coumadin. Discontinue pravastatin for worsening memory impairment.  HLD (hyperlipidemia):  Discontinue pravastatin for worsening memory impairment, continue fish oil, he CAD was only nonobstructive.  Essential hypertension, benign:  Well controlled.    Cardiomyopathy:  EF 40-45% by most recent echocardiogram in 2016. He is not on beta blocker due to history of bradycardia. He was placed on amlodipine given intramyocardial bridging, now off.  He is off  of ACE inhibitor due to low BP.  Alzheimer's dementia:  Follow-up with neurology as planned. I will decrease atorvastatin to 20 mg po daily.   Medication Changes: Current medicines are reviewed at length with the patient today.  Concerns regarding medicines are as outlined above.  The following changes have been made:   This SmartLink is deprecated. Use AVSMEDLIST instead to display the medication list for a patient. This SmartLink is deprecated. Use AVSMEDLIST instead to display the medication list for a patient. This SmartLink is deprecated. Use AVSMEDLIST instead to display the medication list for a patient. Follow up in 6 months.   Ena Dawley, [facility] 09/09/2017 10:51 AM    [facility] [place], [place], [place]  [address] Phone: [phone]; Fax: [phone]

## 2017-09-09 NOTE — Progress Notes (Signed)
Internal Medicine Clinic Attending  Case discussed with Dr. Molt at the time of the visit.  We reviewed the resident's history and exam and pertinent patient test results.  I agree with the assessment, diagnosis, and plan of care documented in the resident's note. 

## 2017-09-10 DIAGNOSIS — R159 Full incontinence of feces: Secondary | ICD-10-CM | POA: Diagnosis not present

## 2017-09-10 DIAGNOSIS — M1A00X0 Idiopathic chronic gout, unspecified site, without tophus (tophi): Secondary | ICD-10-CM | POA: Diagnosis not present

## 2017-09-10 DIAGNOSIS — G20 Parkinson's disease: Secondary | ICD-10-CM | POA: Diagnosis not present

## 2017-09-10 DIAGNOSIS — R001 Bradycardia, unspecified: Secondary | ICD-10-CM | POA: Diagnosis not present

## 2017-09-10 DIAGNOSIS — Z515 Encounter for palliative care: Secondary | ICD-10-CM | POA: Diagnosis not present

## 2017-09-10 DIAGNOSIS — R32 Unspecified urinary incontinence: Secondary | ICD-10-CM | POA: Diagnosis not present

## 2017-09-10 DIAGNOSIS — R531 Weakness: Secondary | ICD-10-CM | POA: Diagnosis not present

## 2017-09-10 DIAGNOSIS — F0280 Dementia in other diseases classified elsewhere without behavioral disturbance: Secondary | ICD-10-CM | POA: Diagnosis not present

## 2017-09-10 DIAGNOSIS — I110 Hypertensive heart disease with heart failure: Secondary | ICD-10-CM | POA: Diagnosis not present

## 2017-09-10 DIAGNOSIS — R269 Unspecified abnormalities of gait and mobility: Secondary | ICD-10-CM | POA: Diagnosis not present

## 2017-09-12 DIAGNOSIS — R269 Unspecified abnormalities of gait and mobility: Secondary | ICD-10-CM | POA: Diagnosis not present

## 2017-09-12 DIAGNOSIS — G20 Parkinson's disease: Secondary | ICD-10-CM | POA: Diagnosis not present

## 2017-09-12 DIAGNOSIS — R531 Weakness: Secondary | ICD-10-CM | POA: Diagnosis not present

## 2017-09-12 DIAGNOSIS — R001 Bradycardia, unspecified: Secondary | ICD-10-CM | POA: Diagnosis not present

## 2017-09-12 DIAGNOSIS — F0280 Dementia in other diseases classified elsewhere without behavioral disturbance: Secondary | ICD-10-CM | POA: Diagnosis not present

## 2017-09-12 DIAGNOSIS — I110 Hypertensive heart disease with heart failure: Secondary | ICD-10-CM | POA: Diagnosis not present

## 2017-09-14 DIAGNOSIS — I110 Hypertensive heart disease with heart failure: Secondary | ICD-10-CM | POA: Diagnosis not present

## 2017-09-14 DIAGNOSIS — F0280 Dementia in other diseases classified elsewhere without behavioral disturbance: Secondary | ICD-10-CM | POA: Diagnosis not present

## 2017-09-14 DIAGNOSIS — G20 Parkinson's disease: Secondary | ICD-10-CM | POA: Diagnosis not present

## 2017-09-14 DIAGNOSIS — R531 Weakness: Secondary | ICD-10-CM | POA: Diagnosis not present

## 2017-09-14 DIAGNOSIS — R269 Unspecified abnormalities of gait and mobility: Secondary | ICD-10-CM | POA: Diagnosis not present

## 2017-09-14 DIAGNOSIS — R001 Bradycardia, unspecified: Secondary | ICD-10-CM | POA: Diagnosis not present

## 2017-09-14 LAB — CULTURE, BLOOD (SINGLE)

## 2017-09-15 DIAGNOSIS — G20 Parkinson's disease: Secondary | ICD-10-CM | POA: Diagnosis not present

## 2017-09-15 DIAGNOSIS — R001 Bradycardia, unspecified: Secondary | ICD-10-CM | POA: Diagnosis not present

## 2017-09-15 DIAGNOSIS — F0280 Dementia in other diseases classified elsewhere without behavioral disturbance: Secondary | ICD-10-CM | POA: Diagnosis not present

## 2017-09-15 DIAGNOSIS — I110 Hypertensive heart disease with heart failure: Secondary | ICD-10-CM | POA: Diagnosis not present

## 2017-09-15 DIAGNOSIS — R269 Unspecified abnormalities of gait and mobility: Secondary | ICD-10-CM | POA: Diagnosis not present

## 2017-09-15 DIAGNOSIS — R531 Weakness: Secondary | ICD-10-CM | POA: Diagnosis not present

## 2017-09-15 LAB — CUP PACEART REMOTE DEVICE CHECK
Date Time Interrogation Session: 20181215074635
MDC IDC PG IMPLANT DT: 20160328

## 2017-09-16 DIAGNOSIS — I110 Hypertensive heart disease with heart failure: Secondary | ICD-10-CM | POA: Diagnosis not present

## 2017-09-16 DIAGNOSIS — R001 Bradycardia, unspecified: Secondary | ICD-10-CM | POA: Diagnosis not present

## 2017-09-16 DIAGNOSIS — F0280 Dementia in other diseases classified elsewhere without behavioral disturbance: Secondary | ICD-10-CM | POA: Diagnosis not present

## 2017-09-16 DIAGNOSIS — G20 Parkinson's disease: Secondary | ICD-10-CM | POA: Diagnosis not present

## 2017-09-16 DIAGNOSIS — R269 Unspecified abnormalities of gait and mobility: Secondary | ICD-10-CM | POA: Diagnosis not present

## 2017-09-16 DIAGNOSIS — R531 Weakness: Secondary | ICD-10-CM | POA: Diagnosis not present

## 2017-09-19 DIAGNOSIS — R269 Unspecified abnormalities of gait and mobility: Secondary | ICD-10-CM | POA: Diagnosis not present

## 2017-09-19 DIAGNOSIS — R531 Weakness: Secondary | ICD-10-CM | POA: Diagnosis not present

## 2017-09-19 DIAGNOSIS — R001 Bradycardia, unspecified: Secondary | ICD-10-CM | POA: Diagnosis not present

## 2017-09-19 DIAGNOSIS — F0280 Dementia in other diseases classified elsewhere without behavioral disturbance: Secondary | ICD-10-CM | POA: Diagnosis not present

## 2017-09-19 DIAGNOSIS — I110 Hypertensive heart disease with heart failure: Secondary | ICD-10-CM | POA: Diagnosis not present

## 2017-09-19 DIAGNOSIS — G20 Parkinson's disease: Secondary | ICD-10-CM | POA: Diagnosis not present

## 2017-09-20 DIAGNOSIS — G20 Parkinson's disease: Secondary | ICD-10-CM | POA: Diagnosis not present

## 2017-09-20 DIAGNOSIS — R269 Unspecified abnormalities of gait and mobility: Secondary | ICD-10-CM | POA: Diagnosis not present

## 2017-09-20 DIAGNOSIS — F0280 Dementia in other diseases classified elsewhere without behavioral disturbance: Secondary | ICD-10-CM | POA: Diagnosis not present

## 2017-09-20 DIAGNOSIS — Z515 Encounter for palliative care: Secondary | ICD-10-CM | POA: Diagnosis not present

## 2017-09-20 DIAGNOSIS — M1A00X0 Idiopathic chronic gout, unspecified site, without tophus (tophi): Secondary | ICD-10-CM | POA: Diagnosis not present

## 2017-09-20 DIAGNOSIS — R531 Weakness: Secondary | ICD-10-CM | POA: Diagnosis not present

## 2017-09-20 DIAGNOSIS — I110 Hypertensive heart disease with heart failure: Secondary | ICD-10-CM | POA: Diagnosis not present

## 2017-09-20 DIAGNOSIS — R001 Bradycardia, unspecified: Secondary | ICD-10-CM | POA: Diagnosis not present

## 2017-09-20 DIAGNOSIS — R32 Unspecified urinary incontinence: Secondary | ICD-10-CM | POA: Diagnosis not present

## 2017-09-20 DIAGNOSIS — R159 Full incontinence of feces: Secondary | ICD-10-CM | POA: Diagnosis not present

## 2017-09-21 ENCOUNTER — Encounter: Payer: Self-pay | Admitting: Podiatry

## 2017-09-21 ENCOUNTER — Ambulatory Visit: Payer: Medicare HMO | Admitting: Podiatry

## 2017-09-21 DIAGNOSIS — M79676 Pain in unspecified toe(s): Secondary | ICD-10-CM | POA: Diagnosis not present

## 2017-09-21 DIAGNOSIS — B351 Tinea unguium: Secondary | ICD-10-CM

## 2017-09-21 DIAGNOSIS — D689 Coagulation defect, unspecified: Secondary | ICD-10-CM | POA: Diagnosis not present

## 2017-09-21 NOTE — Progress Notes (Signed)
Patient ID: [name] [name], male   DOB: 09/25/1930, 84 y.o.   MRN: [id number] Complaint:  Visit Type: Patient returns to my office for continued preventative foot care services. Complaint: Patient states" my nails have grown long and thick and become painful to walk and wear shoes" . The patient presents for preventative foot care services. No changes to ROS.  Patient has not been seen in over 8 months.  Patient has been hospitalized.  Podiatric Exam: Vascular: dorsalis pedis and posterior tibial pulses are palpable bilateral. Capillary return is immediate. Temperature gradient is WNL. Skin turgor WNL  Sensorium: Normal Semmes Weinstein monofilament test. Normal tactile sensation bilaterally. Nail Exam: Pt has thick disfigured discolored nails with subungual debris noted bilateral entire nail hallux through fifth toenails Ulcer Exam: There is no evidence of ulcer or pre-ulcerative changes or infection. Orthopedic Exam: Muscle tone and strength are WNL. No limitations in general ROM. No crepitus or effusions noted. Foot type and digits show no abnormalities.  Contracture right hallux. Hyperpigmentation due to gout. Skin: No Porokeratosis. No infection or ulcers  Diagnosis:  Onychomycosis, , Pain in right toe, pain in left toes  Treatment & Plan Procedures and Treatment: Consent by patient was obtained for treatment procedures. The patient understood the discussion of treatment and procedures well. All questions were answered thoroughly reviewed. Debridement of mycotic and hypertrophic toenails, 1 through 5 bilateral and clearing of subungual debris. No ulceration, no infection noted.  Return Visit-Office Procedure: Patient instructed to return to the office for a follow up visit 10 weeks  for continued evaluation and treatment.   Gardiner Barefoot DPM    Gardiner Barefoot DPM

## 2017-09-22 ENCOUNTER — Telehealth: Payer: Self-pay

## 2017-09-22 ENCOUNTER — Telehealth: Payer: Self-pay | Admitting: Cardiology

## 2017-09-22 DIAGNOSIS — R001 Bradycardia, unspecified: Secondary | ICD-10-CM | POA: Diagnosis not present

## 2017-09-22 DIAGNOSIS — F0280 Dementia in other diseases classified elsewhere without behavioral disturbance: Secondary | ICD-10-CM | POA: Diagnosis not present

## 2017-09-22 DIAGNOSIS — I110 Hypertensive heart disease with heart failure: Secondary | ICD-10-CM | POA: Diagnosis not present

## 2017-09-22 DIAGNOSIS — G20 Parkinson's disease: Secondary | ICD-10-CM | POA: Diagnosis not present

## 2017-09-22 DIAGNOSIS — R531 Weakness: Secondary | ICD-10-CM | POA: Diagnosis not present

## 2017-09-22 DIAGNOSIS — R269 Unspecified abnormalities of gait and mobility: Secondary | ICD-10-CM | POA: Diagnosis not present

## 2017-09-22 NOTE — Telephone Encounter (Signed)
[place] certification and plan of care on 09/22/2016 @ [phone]

## 2017-09-22 NOTE — Telephone Encounter (Signed)
Spoke w/ pt daughter and requested that he send a manual transmission b/c his home monitor has not updated in at least 14 days.   

## 2017-09-23 DIAGNOSIS — R001 Bradycardia, unspecified: Secondary | ICD-10-CM | POA: Diagnosis not present

## 2017-09-23 DIAGNOSIS — G20 Parkinson's disease: Secondary | ICD-10-CM | POA: Diagnosis not present

## 2017-09-23 DIAGNOSIS — F0280 Dementia in other diseases classified elsewhere without behavioral disturbance: Secondary | ICD-10-CM | POA: Diagnosis not present

## 2017-09-23 DIAGNOSIS — R269 Unspecified abnormalities of gait and mobility: Secondary | ICD-10-CM | POA: Diagnosis not present

## 2017-09-23 DIAGNOSIS — R531 Weakness: Secondary | ICD-10-CM | POA: Diagnosis not present

## 2017-09-23 DIAGNOSIS — I110 Hypertensive heart disease with heart failure: Secondary | ICD-10-CM | POA: Diagnosis not present

## 2017-09-26 DIAGNOSIS — R001 Bradycardia, unspecified: Secondary | ICD-10-CM | POA: Diagnosis not present

## 2017-09-26 DIAGNOSIS — R531 Weakness: Secondary | ICD-10-CM | POA: Diagnosis not present

## 2017-09-26 DIAGNOSIS — G20 Parkinson's disease: Secondary | ICD-10-CM | POA: Diagnosis not present

## 2017-09-26 DIAGNOSIS — F0280 Dementia in other diseases classified elsewhere without behavioral disturbance: Secondary | ICD-10-CM | POA: Diagnosis not present

## 2017-09-26 DIAGNOSIS — R269 Unspecified abnormalities of gait and mobility: Secondary | ICD-10-CM | POA: Diagnosis not present

## 2017-09-26 DIAGNOSIS — I110 Hypertensive heart disease with heart failure: Secondary | ICD-10-CM | POA: Diagnosis not present

## 2017-09-27 ENCOUNTER — Encounter: Payer: Self-pay | Admitting: Cardiology

## 2017-09-27 DIAGNOSIS — R531 Weakness: Secondary | ICD-10-CM | POA: Diagnosis not present

## 2017-09-27 DIAGNOSIS — F0280 Dementia in other diseases classified elsewhere without behavioral disturbance: Secondary | ICD-10-CM | POA: Diagnosis not present

## 2017-09-27 DIAGNOSIS — R001 Bradycardia, unspecified: Secondary | ICD-10-CM | POA: Diagnosis not present

## 2017-09-27 DIAGNOSIS — G20 Parkinson's disease: Secondary | ICD-10-CM | POA: Diagnosis not present

## 2017-09-27 DIAGNOSIS — R269 Unspecified abnormalities of gait and mobility: Secondary | ICD-10-CM | POA: Diagnosis not present

## 2017-09-27 DIAGNOSIS — I110 Hypertensive heart disease with heart failure: Secondary | ICD-10-CM | POA: Diagnosis not present

## 2017-09-28 ENCOUNTER — Other Ambulatory Visit: Payer: Self-pay | Admitting: *Deleted

## 2017-09-28 DIAGNOSIS — I110 Hypertensive heart disease with heart failure: Secondary | ICD-10-CM | POA: Diagnosis not present

## 2017-09-28 DIAGNOSIS — R269 Unspecified abnormalities of gait and mobility: Secondary | ICD-10-CM | POA: Diagnosis not present

## 2017-09-28 DIAGNOSIS — E876 Hypokalemia: Secondary | ICD-10-CM

## 2017-09-28 DIAGNOSIS — R001 Bradycardia, unspecified: Secondary | ICD-10-CM | POA: Diagnosis not present

## 2017-09-28 DIAGNOSIS — G20 Parkinson's disease: Secondary | ICD-10-CM | POA: Diagnosis not present

## 2017-09-28 DIAGNOSIS — R531 Weakness: Secondary | ICD-10-CM | POA: Diagnosis not present

## 2017-09-28 DIAGNOSIS — F0280 Dementia in other diseases classified elsewhere without behavioral disturbance: Secondary | ICD-10-CM | POA: Diagnosis not present

## 2017-09-29 DIAGNOSIS — R531 Weakness: Secondary | ICD-10-CM | POA: Diagnosis not present

## 2017-09-29 DIAGNOSIS — R001 Bradycardia, unspecified: Secondary | ICD-10-CM | POA: Diagnosis not present

## 2017-09-29 DIAGNOSIS — I110 Hypertensive heart disease with heart failure: Secondary | ICD-10-CM | POA: Diagnosis not present

## 2017-09-29 DIAGNOSIS — G20 Parkinson's disease: Secondary | ICD-10-CM | POA: Diagnosis not present

## 2017-09-29 DIAGNOSIS — R269 Unspecified abnormalities of gait and mobility: Secondary | ICD-10-CM | POA: Diagnosis not present

## 2017-09-29 DIAGNOSIS — F0280 Dementia in other diseases classified elsewhere without behavioral disturbance: Secondary | ICD-10-CM | POA: Diagnosis not present

## 2017-09-29 MED ORDER — POTASSIUM CHLORIDE CRYS ER 20 MEQ PO TBCR: 40.0000 meq | EXTENDED_RELEASE_TABLET | Freq: Every day | ORAL | 1 refills | Status: DC

## 2017-09-30 DIAGNOSIS — R001 Bradycardia, unspecified: Secondary | ICD-10-CM | POA: Diagnosis not present

## 2017-09-30 DIAGNOSIS — F0280 Dementia in other diseases classified elsewhere without behavioral disturbance: Secondary | ICD-10-CM | POA: Diagnosis not present

## 2017-09-30 DIAGNOSIS — G20 Parkinson's disease: Secondary | ICD-10-CM | POA: Diagnosis not present

## 2017-09-30 DIAGNOSIS — R531 Weakness: Secondary | ICD-10-CM | POA: Diagnosis not present

## 2017-09-30 DIAGNOSIS — R269 Unspecified abnormalities of gait and mobility: Secondary | ICD-10-CM | POA: Diagnosis not present

## 2017-09-30 DIAGNOSIS — I110 Hypertensive heart disease with heart failure: Secondary | ICD-10-CM | POA: Diagnosis not present

## 2017-10-03 ENCOUNTER — Ambulatory Visit (INDEPENDENT_AMBULATORY_CARE_PROVIDER_SITE_OTHER): Payer: Medicare HMO | Admitting: *Deleted

## 2017-10-03 DIAGNOSIS — G20 Parkinson's disease: Secondary | ICD-10-CM | POA: Diagnosis not present

## 2017-10-03 DIAGNOSIS — R55 Syncope and collapse: Secondary | ICD-10-CM

## 2017-10-03 DIAGNOSIS — R531 Weakness: Secondary | ICD-10-CM | POA: Diagnosis not present

## 2017-10-03 DIAGNOSIS — R269 Unspecified abnormalities of gait and mobility: Secondary | ICD-10-CM | POA: Diagnosis not present

## 2017-10-03 DIAGNOSIS — I110 Hypertensive heart disease with heart failure: Secondary | ICD-10-CM | POA: Diagnosis not present

## 2017-10-03 DIAGNOSIS — F0280 Dementia in other diseases classified elsewhere without behavioral disturbance: Secondary | ICD-10-CM | POA: Diagnosis not present

## 2017-10-03 DIAGNOSIS — R001 Bradycardia, unspecified: Secondary | ICD-10-CM | POA: Diagnosis not present

## 2017-10-04 DIAGNOSIS — F0280 Dementia in other diseases classified elsewhere without behavioral disturbance: Secondary | ICD-10-CM | POA: Diagnosis not present

## 2017-10-04 DIAGNOSIS — I110 Hypertensive heart disease with heart failure: Secondary | ICD-10-CM | POA: Diagnosis not present

## 2017-10-04 DIAGNOSIS — G20 Parkinson's disease: Secondary | ICD-10-CM | POA: Diagnosis not present

## 2017-10-04 DIAGNOSIS — R269 Unspecified abnormalities of gait and mobility: Secondary | ICD-10-CM | POA: Diagnosis not present

## 2017-10-04 DIAGNOSIS — R001 Bradycardia, unspecified: Secondary | ICD-10-CM | POA: Diagnosis not present

## 2017-10-04 DIAGNOSIS — R531 Weakness: Secondary | ICD-10-CM | POA: Diagnosis not present

## 2017-10-05 DIAGNOSIS — G20 Parkinson's disease: Secondary | ICD-10-CM | POA: Diagnosis not present

## 2017-10-05 DIAGNOSIS — R001 Bradycardia, unspecified: Secondary | ICD-10-CM | POA: Diagnosis not present

## 2017-10-05 DIAGNOSIS — F0280 Dementia in other diseases classified elsewhere without behavioral disturbance: Secondary | ICD-10-CM | POA: Diagnosis not present

## 2017-10-05 DIAGNOSIS — R531 Weakness: Secondary | ICD-10-CM | POA: Diagnosis not present

## 2017-10-05 DIAGNOSIS — I110 Hypertensive heart disease with heart failure: Secondary | ICD-10-CM | POA: Diagnosis not present

## 2017-10-05 DIAGNOSIS — R269 Unspecified abnormalities of gait and mobility: Secondary | ICD-10-CM | POA: Diagnosis not present

## 2017-10-05 NOTE — Progress Notes (Signed)
Carelink Summary Report / Loop Recorder 

## 2017-10-06 DIAGNOSIS — R269 Unspecified abnormalities of gait and mobility: Secondary | ICD-10-CM | POA: Diagnosis not present

## 2017-10-06 DIAGNOSIS — I110 Hypertensive heart disease with heart failure: Secondary | ICD-10-CM | POA: Diagnosis not present

## 2017-10-06 DIAGNOSIS — R001 Bradycardia, unspecified: Secondary | ICD-10-CM | POA: Diagnosis not present

## 2017-10-06 DIAGNOSIS — R531 Weakness: Secondary | ICD-10-CM | POA: Diagnosis not present

## 2017-10-06 DIAGNOSIS — G20 Parkinson's disease: Secondary | ICD-10-CM | POA: Diagnosis not present

## 2017-10-06 DIAGNOSIS — F0280 Dementia in other diseases classified elsewhere without behavioral disturbance: Secondary | ICD-10-CM | POA: Diagnosis not present

## 2017-10-07 DIAGNOSIS — G20 Parkinson's disease: Secondary | ICD-10-CM | POA: Diagnosis not present

## 2017-10-07 DIAGNOSIS — R269 Unspecified abnormalities of gait and mobility: Secondary | ICD-10-CM | POA: Diagnosis not present

## 2017-10-07 DIAGNOSIS — F0280 Dementia in other diseases classified elsewhere without behavioral disturbance: Secondary | ICD-10-CM | POA: Diagnosis not present

## 2017-10-07 DIAGNOSIS — R001 Bradycardia, unspecified: Secondary | ICD-10-CM | POA: Diagnosis not present

## 2017-10-07 DIAGNOSIS — R531 Weakness: Secondary | ICD-10-CM | POA: Diagnosis not present

## 2017-10-07 DIAGNOSIS — I110 Hypertensive heart disease with heart failure: Secondary | ICD-10-CM | POA: Diagnosis not present

## 2017-10-10 DIAGNOSIS — F0280 Dementia in other diseases classified elsewhere without behavioral disturbance: Secondary | ICD-10-CM | POA: Diagnosis not present

## 2017-10-10 DIAGNOSIS — R001 Bradycardia, unspecified: Secondary | ICD-10-CM | POA: Diagnosis not present

## 2017-10-10 DIAGNOSIS — G20 Parkinson's disease: Secondary | ICD-10-CM | POA: Diagnosis not present

## 2017-10-10 DIAGNOSIS — R531 Weakness: Secondary | ICD-10-CM | POA: Diagnosis not present

## 2017-10-10 DIAGNOSIS — R269 Unspecified abnormalities of gait and mobility: Secondary | ICD-10-CM | POA: Diagnosis not present

## 2017-10-10 DIAGNOSIS — I110 Hypertensive heart disease with heart failure: Secondary | ICD-10-CM | POA: Diagnosis not present

## 2017-10-11 DIAGNOSIS — R269 Unspecified abnormalities of gait and mobility: Secondary | ICD-10-CM | POA: Diagnosis not present

## 2017-10-11 DIAGNOSIS — R531 Weakness: Secondary | ICD-10-CM | POA: Diagnosis not present

## 2017-10-11 DIAGNOSIS — F0280 Dementia in other diseases classified elsewhere without behavioral disturbance: Secondary | ICD-10-CM | POA: Diagnosis not present

## 2017-10-11 DIAGNOSIS — R001 Bradycardia, unspecified: Secondary | ICD-10-CM | POA: Diagnosis not present

## 2017-10-11 DIAGNOSIS — G20 Parkinson's disease: Secondary | ICD-10-CM | POA: Diagnosis not present

## 2017-10-11 DIAGNOSIS — I110 Hypertensive heart disease with heart failure: Secondary | ICD-10-CM | POA: Diagnosis not present

## 2017-10-11 LAB — CUP PACEART REMOTE DEVICE CHECK
Date Time Interrogation Session: 20190114131009
MDC IDC PG IMPLANT DT: 20160328

## 2017-10-12 DIAGNOSIS — R001 Bradycardia, unspecified: Secondary | ICD-10-CM | POA: Diagnosis not present

## 2017-10-12 DIAGNOSIS — R531 Weakness: Secondary | ICD-10-CM | POA: Diagnosis not present

## 2017-10-12 DIAGNOSIS — I110 Hypertensive heart disease with heart failure: Secondary | ICD-10-CM | POA: Diagnosis not present

## 2017-10-12 DIAGNOSIS — G20 Parkinson's disease: Secondary | ICD-10-CM | POA: Diagnosis not present

## 2017-10-12 DIAGNOSIS — R269 Unspecified abnormalities of gait and mobility: Secondary | ICD-10-CM | POA: Diagnosis not present

## 2017-10-12 DIAGNOSIS — F0280 Dementia in other diseases classified elsewhere without behavioral disturbance: Secondary | ICD-10-CM | POA: Diagnosis not present

## 2017-10-13 DIAGNOSIS — R269 Unspecified abnormalities of gait and mobility: Secondary | ICD-10-CM | POA: Diagnosis not present

## 2017-10-13 DIAGNOSIS — I110 Hypertensive heart disease with heart failure: Secondary | ICD-10-CM | POA: Diagnosis not present

## 2017-10-13 DIAGNOSIS — G20 Parkinson's disease: Secondary | ICD-10-CM | POA: Diagnosis not present

## 2017-10-13 DIAGNOSIS — R001 Bradycardia, unspecified: Secondary | ICD-10-CM | POA: Diagnosis not present

## 2017-10-13 DIAGNOSIS — R531 Weakness: Secondary | ICD-10-CM | POA: Diagnosis not present

## 2017-10-13 DIAGNOSIS — F0280 Dementia in other diseases classified elsewhere without behavioral disturbance: Secondary | ICD-10-CM | POA: Diagnosis not present

## 2017-10-14 DIAGNOSIS — G20 Parkinson's disease: Secondary | ICD-10-CM | POA: Diagnosis not present

## 2017-10-14 DIAGNOSIS — R269 Unspecified abnormalities of gait and mobility: Secondary | ICD-10-CM | POA: Diagnosis not present

## 2017-10-14 DIAGNOSIS — R001 Bradycardia, unspecified: Secondary | ICD-10-CM | POA: Diagnosis not present

## 2017-10-14 DIAGNOSIS — R531 Weakness: Secondary | ICD-10-CM | POA: Diagnosis not present

## 2017-10-14 DIAGNOSIS — I110 Hypertensive heart disease with heart failure: Secondary | ICD-10-CM | POA: Diagnosis not present

## 2017-10-14 DIAGNOSIS — F0280 Dementia in other diseases classified elsewhere without behavioral disturbance: Secondary | ICD-10-CM | POA: Diagnosis not present

## 2017-10-17 DIAGNOSIS — I110 Hypertensive heart disease with heart failure: Secondary | ICD-10-CM | POA: Diagnosis not present

## 2017-10-17 DIAGNOSIS — G20 Parkinson's disease: Secondary | ICD-10-CM | POA: Diagnosis not present

## 2017-10-17 DIAGNOSIS — R531 Weakness: Secondary | ICD-10-CM | POA: Diagnosis not present

## 2017-10-17 DIAGNOSIS — F0280 Dementia in other diseases classified elsewhere without behavioral disturbance: Secondary | ICD-10-CM | POA: Diagnosis not present

## 2017-10-17 DIAGNOSIS — R001 Bradycardia, unspecified: Secondary | ICD-10-CM | POA: Diagnosis not present

## 2017-10-17 DIAGNOSIS — R269 Unspecified abnormalities of gait and mobility: Secondary | ICD-10-CM | POA: Diagnosis not present

## 2017-10-18 DIAGNOSIS — R531 Weakness: Secondary | ICD-10-CM | POA: Diagnosis not present

## 2017-10-18 DIAGNOSIS — G20 Parkinson's disease: Secondary | ICD-10-CM | POA: Diagnosis not present

## 2017-10-18 DIAGNOSIS — R269 Unspecified abnormalities of gait and mobility: Secondary | ICD-10-CM | POA: Diagnosis not present

## 2017-10-18 DIAGNOSIS — R001 Bradycardia, unspecified: Secondary | ICD-10-CM | POA: Diagnosis not present

## 2017-10-18 DIAGNOSIS — F0280 Dementia in other diseases classified elsewhere without behavioral disturbance: Secondary | ICD-10-CM | POA: Diagnosis not present

## 2017-10-18 DIAGNOSIS — I110 Hypertensive heart disease with heart failure: Secondary | ICD-10-CM | POA: Diagnosis not present

## 2017-10-19 DIAGNOSIS — R001 Bradycardia, unspecified: Secondary | ICD-10-CM | POA: Diagnosis not present

## 2017-10-19 DIAGNOSIS — G20 Parkinson's disease: Secondary | ICD-10-CM | POA: Diagnosis not present

## 2017-10-19 DIAGNOSIS — F0280 Dementia in other diseases classified elsewhere without behavioral disturbance: Secondary | ICD-10-CM | POA: Diagnosis not present

## 2017-10-19 DIAGNOSIS — R269 Unspecified abnormalities of gait and mobility: Secondary | ICD-10-CM | POA: Diagnosis not present

## 2017-10-19 DIAGNOSIS — R531 Weakness: Secondary | ICD-10-CM | POA: Diagnosis not present

## 2017-10-19 DIAGNOSIS — I110 Hypertensive heart disease with heart failure: Secondary | ICD-10-CM | POA: Diagnosis not present

## 2017-10-20 DIAGNOSIS — F0280 Dementia in other diseases classified elsewhere without behavioral disturbance: Secondary | ICD-10-CM | POA: Diagnosis not present

## 2017-10-20 DIAGNOSIS — R269 Unspecified abnormalities of gait and mobility: Secondary | ICD-10-CM | POA: Diagnosis not present

## 2017-10-20 DIAGNOSIS — G20 Parkinson's disease: Secondary | ICD-10-CM | POA: Diagnosis not present

## 2017-10-20 DIAGNOSIS — R531 Weakness: Secondary | ICD-10-CM | POA: Diagnosis not present

## 2017-10-20 DIAGNOSIS — R001 Bradycardia, unspecified: Secondary | ICD-10-CM | POA: Diagnosis not present

## 2017-10-20 DIAGNOSIS — I110 Hypertensive heart disease with heart failure: Secondary | ICD-10-CM | POA: Diagnosis not present

## 2017-10-21 DIAGNOSIS — R32 Unspecified urinary incontinence: Secondary | ICD-10-CM | POA: Diagnosis not present

## 2017-10-21 DIAGNOSIS — F0280 Dementia in other diseases classified elsewhere without behavioral disturbance: Secondary | ICD-10-CM | POA: Diagnosis not present

## 2017-10-21 DIAGNOSIS — R269 Unspecified abnormalities of gait and mobility: Secondary | ICD-10-CM | POA: Diagnosis not present

## 2017-10-21 DIAGNOSIS — I110 Hypertensive heart disease with heart failure: Secondary | ICD-10-CM | POA: Diagnosis not present

## 2017-10-21 DIAGNOSIS — M1A00X0 Idiopathic chronic gout, unspecified site, without tophus (tophi): Secondary | ICD-10-CM | POA: Diagnosis not present

## 2017-10-21 DIAGNOSIS — G20 Parkinson's disease: Secondary | ICD-10-CM | POA: Diagnosis not present

## 2017-10-21 DIAGNOSIS — R159 Full incontinence of feces: Secondary | ICD-10-CM | POA: Diagnosis not present

## 2017-10-21 DIAGNOSIS — R531 Weakness: Secondary | ICD-10-CM | POA: Diagnosis not present

## 2017-10-21 DIAGNOSIS — Z515 Encounter for palliative care: Secondary | ICD-10-CM | POA: Diagnosis not present

## 2017-10-21 DIAGNOSIS — R001 Bradycardia, unspecified: Secondary | ICD-10-CM | POA: Diagnosis not present

## 2017-10-24 ENCOUNTER — Ambulatory Visit: Payer: Medicare HMO | Admitting: Pharmacist

## 2017-10-24 DIAGNOSIS — Z7902 Long term (current) use of antithrombotics/antiplatelets: Secondary | ICD-10-CM | POA: Diagnosis not present

## 2017-10-24 DIAGNOSIS — Z86711 Personal history of pulmonary embolism: Secondary | ICD-10-CM

## 2017-10-24 DIAGNOSIS — I110 Hypertensive heart disease with heart failure: Secondary | ICD-10-CM | POA: Diagnosis not present

## 2017-10-24 DIAGNOSIS — R531 Weakness: Secondary | ICD-10-CM | POA: Diagnosis not present

## 2017-10-24 DIAGNOSIS — R269 Unspecified abnormalities of gait and mobility: Secondary | ICD-10-CM | POA: Diagnosis not present

## 2017-10-24 DIAGNOSIS — F0280 Dementia in other diseases classified elsewhere without behavioral disturbance: Secondary | ICD-10-CM | POA: Diagnosis not present

## 2017-10-24 DIAGNOSIS — R001 Bradycardia, unspecified: Secondary | ICD-10-CM | POA: Diagnosis not present

## 2017-10-24 DIAGNOSIS — G20 Parkinson's disease: Secondary | ICD-10-CM | POA: Diagnosis not present

## 2017-10-24 LAB — POCT INR: INR: 2.9

## 2017-10-24 NOTE — Progress Notes (Signed)
Anticoagulation Management [name] [name] is a 84 y.o. male who reports to the clinic for monitoring of warfarin treatment.    Indication: PE , history of; long term (current) use of anticoagulants. Duration: indefinite Supervising physician: [facility] Visit History: Patient does not report signs/symptoms of bleeding or thromboembolism  Other recent changes: No diet, medications, lifestyle changes endorsed.  Anticoagulation Episode Summary    Current INR goal:   2.0-3.0  TTR:   74.3 % (6.8 y)  Next INR check:   12/05/2017  INR from last check:   2.90 (10/24/2017)  Weekly max warfarin dose:     Target end date:   Indefinite  INR check location:   [facility]  Preferred lab:     Send INR reminders to:   ANTICOAG IMP   Indications   History of pulmonary embolism [Z86.711] Encounter for long-term use of antiplatelets/antithrombotics [Z79.02]       Comments:   History of multiple venous embolic episodes. Will continue to annual re-evaluate continued need for warfarin weighing risks vs. benefits.       Anticoagulation Care Providers    Provider Role Specialty Phone number   Bertha Stakes, MD  Internal Medicine [phone]      Allergies  Allergen Reactions  . Aricept [Donepezil Hcl] Other (See Comments)    Symptomatic Bradycardia.  . Other Other (See Comments)    Shrimp gives him gout  . Shellfish-Derived Products Other (See Comments)    Causes a flare up with Gout   Prior to Admission medications   Medication Sig Start Date End Date Taking? Authorizing Provider  acetaminophen (TYLENOL) 500 MG tablet Take 1,000 mg by mouth 2 (two) times daily.    Yes [provider]  amLODipine (NORVASC) 5 MG tablet Take 0.5 tablets (2.5 mg total) by mouth daily. 06/16/17  Yes Axel Filler, MD  amLODipine (NORVASC) 5 MG tablet TAKE 1 TABLET BY MOUTH EVERY DAY 08/15/17  Yes Joni Reining C, DO  b complex vitamins tablet Take 1 tablet by  mouth every morning.    Yes [provider]  Ca Phosphate-Cholecalciferol (CALCIUM/VITAMIN D3 GUMMIES) 250-350 MG-UNIT CHEW Chew 1 Dose by mouth 2 (two) times daily. 07/08/16  Yes [name], DO  carbidopa-levodopa (SINEMET IR) 25-100 MG tablet Take 1 tablet by mouth 4 (four) times daily. 08/25/17  Yes [name], DO  fish oil-omega-3 fatty acids 1000 MG capsule Take 2 g by mouth every morning.    Yes [provider]  memantine (NAMENDA XR) 28 MG CP24 24 hr capsule Take 1 capsule (28 mg total) by mouth every morning. 04/25/17  Yes Garvin Fila, MD  Multiple Vitamins-Minerals (MULTIVITAMIN WITH MINERALS) tablet Take 1 tablet by mouth every morning.    Yes [provider]  polyethylene glycol (MIRALAX / GLYCOLAX) packet Take 17 g by mouth daily as needed (constipation).   Yes [provider]  potassium chloride SA (KLOR-CON M20) 20 MEQ tablet Take 2 tablets (40 mEq total) by mouth daily. 09/29/17  Yes [name], DO  QUEtiapine (SEROQUEL) 25 MG tablet Take 1 tablet (25 mg total) by mouth at bedtime. May use half a tablet 12.5 mg during the daytime as needed for agitation/hallucinations 04/05/17  Yes Garvin Fila, MD  senna (SENOKOT) 8.6 MG TABS tablet Take 1 tablet (8.6 mg total) by mouth daily as needed for mild constipation. 07/13/16  Yes Velna Ochs, MD  vitamin C (ASCORBIC ACID) 500 MG tablet Take 1,000  mg by mouth every morning.    Yes [provider]  warfarin (COUMADIN) 5 MG tablet Take 0.5-1 tablets (2.5-5 mg total) by mouth See admin instructions. Take 1 tablet on Mon, and Fri. Take 1/2 tablet all other days Patient taking differently: Take 2.5-5 mg by mouth See admin instructions. 5mg  Mon, Wed - 2.5mg  all other days 05/26/17  Yes [name], DO   Past Medical History:  Diagnosis Date  . Anxiety   . Coronary artery disease, non-occlusive    with history of MI; Cath 2008 w multivessel nonobstructive CAD  . Dementia   .  Depression   . GERD (gastroesophageal reflux disease)   . Hyperlipidemia   . Hypertension   . Memory loss   . Parkinson disease ([place])   . PE (pulmonary embolism)    unprovoked PE completed 6 months of warfarin, warfarin d/ced 02/12/2010, repeat PE 07/10/2010 after a long car ride and now on lifelong coumadin.  . Pituitary microadenoma ([place])    incidental finding CT 12/2009  . Prostate cancer ([place])   . Scoliosis   . Sigmoid volvulus ([place]) 08/02/2016  . Volvulus of colon ([place]) 06/01/2017   Social History   Socioeconomic History  . Marital status: Married    Spouse name: Not on file  . Number of children: 4  . Years of education: master's  . Highest education level: Not on file  Social Needs  . Financial resource strain: Not on file  . Food insecurity - worry: Not on file  . Food insecurity - inability: Not on file  . Transportation needs - medical: Not on file  . Transportation needs - non-medical: Not on file  Occupational History  . Occupation: Retired    Fish farm manager: RETIRED  Tobacco Use  . Smoking status: Never Smoker  . Smokeless tobacco: Never Used  Substance and Sexual Activity  . Alcohol use: No    Alcohol/week: 0.0 oz  . Drug use: No  . Sexual activity: No  Other Topics Concern  . Not on file  Social History Narrative   Lives with daughter. Wife also has severe dementia.    Drinks 1 cup of coffee a day    Family History  Problem Relation Age of Onset  . Hypertension Father        Passed away from cerebral hemorrhage at age of 108.  [name] Dementia Mother        Passed away  . Hypertension Child        4 adult children  . Cervical cancer Other   . Lung cancer Other   . Stroke Other   . Heart attack Neg Hx     ASSESSMENT Recent Results: The most recent result is correlated with 27.5 mg per week: Lab Results  Component Value Date   INR 2.90 10/24/2017   INR 2.41 06/14/2017   INR 2.01 06/14/2017    Anticoagulation Dosing: Description   Give 1/2 tablet  by mouth on Sundays, Tuesdays, Thursdays and Saturdays; all other days--give 1 tablet by mouth once-daily at 6PM.      INR today: Therapeutic  PLAN Weekly dose was decreased by 9% to 25 mg per week  Patient Instructions  Patient's daughter instructed to give medications as defined in the Anti-coagulation Track section of this encounter.  Patient's daughter instructed to give today's dose.  Patient's daughter  instructed to give 1/2 tablet by mouth on Sundays, Tuesdays, Thursdays and Saturdays; all other days--give 1 tablet by mouth once-daily at [facility].  Patient's daughter verbalized understanding of these instructions.     Patient advised to contact clinic or seek medical attention if signs/symptoms of bleeding or thromboembolism occur.  Patient verbalized understanding by repeating back information and was advised to contact me if further medication-related questions arise. Patient was also provided an information handout.  Follow-up Return in about 6 weeks (around 12/05/2017) for Follow up INR at 3:30PM.  Pennie Banter, PharmD, CACP, CPP  15 minutes spent face-to-face with the patient during the encounter. 50% of time spent on education. 50% of time was spent on fingerstick point of care INR sample collection, processing, results determination, dose adjustment and documentation in [email or web address].

## 2017-10-24 NOTE — Patient Instructions (Signed)
Patient's daughter instructed to give medications as defined in the Anti-coagulation Track section of this encounter.  Patient's daughter instructed to give today's dose.  Patient's daughter  instructed to give 1/2 tablet by mouth on Sundays, Tuesdays, Thursdays and Saturdays; all other days--give 1 tablet by mouth once-daily at [facility]. Patient's daughter verbalized understanding of these instructions.

## 2017-10-25 DIAGNOSIS — R531 Weakness: Secondary | ICD-10-CM | POA: Diagnosis not present

## 2017-10-25 DIAGNOSIS — I110 Hypertensive heart disease with heart failure: Secondary | ICD-10-CM | POA: Diagnosis not present

## 2017-10-25 DIAGNOSIS — G20 Parkinson's disease: Secondary | ICD-10-CM | POA: Diagnosis not present

## 2017-10-25 DIAGNOSIS — R001 Bradycardia, unspecified: Secondary | ICD-10-CM | POA: Diagnosis not present

## 2017-10-25 DIAGNOSIS — R269 Unspecified abnormalities of gait and mobility: Secondary | ICD-10-CM | POA: Diagnosis not present

## 2017-10-25 DIAGNOSIS — F0280 Dementia in other diseases classified elsewhere without behavioral disturbance: Secondary | ICD-10-CM | POA: Diagnosis not present

## 2017-10-26 DIAGNOSIS — I110 Hypertensive heart disease with heart failure: Secondary | ICD-10-CM | POA: Diagnosis not present

## 2017-10-26 DIAGNOSIS — R001 Bradycardia, unspecified: Secondary | ICD-10-CM | POA: Diagnosis not present

## 2017-10-26 DIAGNOSIS — G20 Parkinson's disease: Secondary | ICD-10-CM | POA: Diagnosis not present

## 2017-10-26 DIAGNOSIS — F0280 Dementia in other diseases classified elsewhere without behavioral disturbance: Secondary | ICD-10-CM | POA: Diagnosis not present

## 2017-10-26 DIAGNOSIS — R269 Unspecified abnormalities of gait and mobility: Secondary | ICD-10-CM | POA: Diagnosis not present

## 2017-10-26 DIAGNOSIS — R531 Weakness: Secondary | ICD-10-CM | POA: Diagnosis not present

## 2017-10-27 DIAGNOSIS — R269 Unspecified abnormalities of gait and mobility: Secondary | ICD-10-CM | POA: Diagnosis not present

## 2017-10-27 DIAGNOSIS — I110 Hypertensive heart disease with heart failure: Secondary | ICD-10-CM | POA: Diagnosis not present

## 2017-10-27 DIAGNOSIS — R001 Bradycardia, unspecified: Secondary | ICD-10-CM | POA: Diagnosis not present

## 2017-10-27 DIAGNOSIS — G20 Parkinson's disease: Secondary | ICD-10-CM | POA: Diagnosis not present

## 2017-10-27 DIAGNOSIS — F0280 Dementia in other diseases classified elsewhere without behavioral disturbance: Secondary | ICD-10-CM | POA: Diagnosis not present

## 2017-10-27 DIAGNOSIS — R531 Weakness: Secondary | ICD-10-CM | POA: Diagnosis not present

## 2017-10-28 ENCOUNTER — Other Ambulatory Visit: Payer: Self-pay | Admitting: *Deleted

## 2017-10-28 ENCOUNTER — Other Ambulatory Visit: Payer: Self-pay | Admitting: Internal Medicine

## 2017-10-28 DIAGNOSIS — G20 Parkinson's disease: Secondary | ICD-10-CM | POA: Diagnosis not present

## 2017-10-28 DIAGNOSIS — R531 Weakness: Secondary | ICD-10-CM | POA: Diagnosis not present

## 2017-10-28 DIAGNOSIS — I110 Hypertensive heart disease with heart failure: Secondary | ICD-10-CM | POA: Diagnosis not present

## 2017-10-28 DIAGNOSIS — F0280 Dementia in other diseases classified elsewhere without behavioral disturbance: Secondary | ICD-10-CM | POA: Diagnosis not present

## 2017-10-28 DIAGNOSIS — R001 Bradycardia, unspecified: Secondary | ICD-10-CM | POA: Diagnosis not present

## 2017-10-28 DIAGNOSIS — R269 Unspecified abnormalities of gait and mobility: Secondary | ICD-10-CM | POA: Diagnosis not present

## 2017-10-28 MED ORDER — CARBIDOPA-LEVODOPA 25-100 MG PO TABS: 1.0000 | ORAL_TABLET | Freq: Four times a day (QID) | ORAL | 2 refills | Status: DC

## 2017-10-31 DIAGNOSIS — R269 Unspecified abnormalities of gait and mobility: Secondary | ICD-10-CM | POA: Diagnosis not present

## 2017-10-31 DIAGNOSIS — G20 Parkinson's disease: Secondary | ICD-10-CM | POA: Diagnosis not present

## 2017-10-31 DIAGNOSIS — R001 Bradycardia, unspecified: Secondary | ICD-10-CM | POA: Diagnosis not present

## 2017-10-31 DIAGNOSIS — R531 Weakness: Secondary | ICD-10-CM | POA: Diagnosis not present

## 2017-10-31 DIAGNOSIS — I110 Hypertensive heart disease with heart failure: Secondary | ICD-10-CM | POA: Diagnosis not present

## 2017-10-31 DIAGNOSIS — F0280 Dementia in other diseases classified elsewhere without behavioral disturbance: Secondary | ICD-10-CM | POA: Diagnosis not present

## 2017-11-01 DIAGNOSIS — R531 Weakness: Secondary | ICD-10-CM | POA: Diagnosis not present

## 2017-11-01 DIAGNOSIS — I110 Hypertensive heart disease with heart failure: Secondary | ICD-10-CM | POA: Diagnosis not present

## 2017-11-01 DIAGNOSIS — R269 Unspecified abnormalities of gait and mobility: Secondary | ICD-10-CM | POA: Diagnosis not present

## 2017-11-01 DIAGNOSIS — R001 Bradycardia, unspecified: Secondary | ICD-10-CM | POA: Diagnosis not present

## 2017-11-01 DIAGNOSIS — F0280 Dementia in other diseases classified elsewhere without behavioral disturbance: Secondary | ICD-10-CM | POA: Diagnosis not present

## 2017-11-01 DIAGNOSIS — G20 Parkinson's disease: Secondary | ICD-10-CM | POA: Diagnosis not present

## 2017-11-02 ENCOUNTER — Ambulatory Visit (INDEPENDENT_AMBULATORY_CARE_PROVIDER_SITE_OTHER): Payer: Medicare HMO | Admitting: *Deleted

## 2017-11-02 ENCOUNTER — Telehealth: Payer: Self-pay | Admitting: Cardiology

## 2017-11-02 DIAGNOSIS — I110 Hypertensive heart disease with heart failure: Secondary | ICD-10-CM | POA: Diagnosis not present

## 2017-11-02 DIAGNOSIS — R001 Bradycardia, unspecified: Secondary | ICD-10-CM | POA: Diagnosis not present

## 2017-11-02 DIAGNOSIS — F0280 Dementia in other diseases classified elsewhere without behavioral disturbance: Secondary | ICD-10-CM | POA: Diagnosis not present

## 2017-11-02 DIAGNOSIS — R269 Unspecified abnormalities of gait and mobility: Secondary | ICD-10-CM | POA: Diagnosis not present

## 2017-11-02 DIAGNOSIS — G20 Parkinson's disease: Secondary | ICD-10-CM | POA: Diagnosis not present

## 2017-11-02 DIAGNOSIS — R55 Syncope and collapse: Secondary | ICD-10-CM

## 2017-11-02 DIAGNOSIS — R531 Weakness: Secondary | ICD-10-CM | POA: Diagnosis not present

## 2017-11-02 NOTE — Progress Notes (Signed)
Carelink Summary Report / Loop Recorder 

## 2017-11-02 NOTE — Telephone Encounter (Signed)
Spoke w/ pt daughter and requested that he send a manual transmission b/c his home monitor has not updated in at least 14 days.   

## 2017-11-03 DIAGNOSIS — I110 Hypertensive heart disease with heart failure: Secondary | ICD-10-CM | POA: Diagnosis not present

## 2017-11-03 DIAGNOSIS — R001 Bradycardia, unspecified: Secondary | ICD-10-CM | POA: Diagnosis not present

## 2017-11-03 DIAGNOSIS — R531 Weakness: Secondary | ICD-10-CM | POA: Diagnosis not present

## 2017-11-03 DIAGNOSIS — F0280 Dementia in other diseases classified elsewhere without behavioral disturbance: Secondary | ICD-10-CM | POA: Diagnosis not present

## 2017-11-03 DIAGNOSIS — G20 Parkinson's disease: Secondary | ICD-10-CM | POA: Diagnosis not present

## 2017-11-03 DIAGNOSIS — R269 Unspecified abnormalities of gait and mobility: Secondary | ICD-10-CM | POA: Diagnosis not present

## 2017-11-04 DIAGNOSIS — F0280 Dementia in other diseases classified elsewhere without behavioral disturbance: Secondary | ICD-10-CM | POA: Diagnosis not present

## 2017-11-04 DIAGNOSIS — R531 Weakness: Secondary | ICD-10-CM | POA: Diagnosis not present

## 2017-11-04 DIAGNOSIS — I110 Hypertensive heart disease with heart failure: Secondary | ICD-10-CM | POA: Diagnosis not present

## 2017-11-04 DIAGNOSIS — R001 Bradycardia, unspecified: Secondary | ICD-10-CM | POA: Diagnosis not present

## 2017-11-04 DIAGNOSIS — G20 Parkinson's disease: Secondary | ICD-10-CM | POA: Diagnosis not present

## 2017-11-04 DIAGNOSIS — R269 Unspecified abnormalities of gait and mobility: Secondary | ICD-10-CM | POA: Diagnosis not present

## 2017-11-07 DIAGNOSIS — R269 Unspecified abnormalities of gait and mobility: Secondary | ICD-10-CM | POA: Diagnosis not present

## 2017-11-07 DIAGNOSIS — I110 Hypertensive heart disease with heart failure: Secondary | ICD-10-CM | POA: Diagnosis not present

## 2017-11-07 DIAGNOSIS — G20 Parkinson's disease: Secondary | ICD-10-CM | POA: Diagnosis not present

## 2017-11-07 DIAGNOSIS — F0280 Dementia in other diseases classified elsewhere without behavioral disturbance: Secondary | ICD-10-CM | POA: Diagnosis not present

## 2017-11-07 DIAGNOSIS — R531 Weakness: Secondary | ICD-10-CM | POA: Diagnosis not present

## 2017-11-07 DIAGNOSIS — R001 Bradycardia, unspecified: Secondary | ICD-10-CM | POA: Diagnosis not present

## 2017-11-08 DIAGNOSIS — I110 Hypertensive heart disease with heart failure: Secondary | ICD-10-CM | POA: Diagnosis not present

## 2017-11-08 DIAGNOSIS — R269 Unspecified abnormalities of gait and mobility: Secondary | ICD-10-CM | POA: Diagnosis not present

## 2017-11-08 DIAGNOSIS — R001 Bradycardia, unspecified: Secondary | ICD-10-CM | POA: Diagnosis not present

## 2017-11-08 DIAGNOSIS — R531 Weakness: Secondary | ICD-10-CM | POA: Diagnosis not present

## 2017-11-08 DIAGNOSIS — F0280 Dementia in other diseases classified elsewhere without behavioral disturbance: Secondary | ICD-10-CM | POA: Diagnosis not present

## 2017-11-08 DIAGNOSIS — G20 Parkinson's disease: Secondary | ICD-10-CM | POA: Diagnosis not present

## 2017-11-09 DIAGNOSIS — I110 Hypertensive heart disease with heart failure: Secondary | ICD-10-CM | POA: Diagnosis not present

## 2017-11-09 DIAGNOSIS — F0280 Dementia in other diseases classified elsewhere without behavioral disturbance: Secondary | ICD-10-CM | POA: Diagnosis not present

## 2017-11-09 DIAGNOSIS — G20 Parkinson's disease: Secondary | ICD-10-CM | POA: Diagnosis not present

## 2017-11-09 DIAGNOSIS — R269 Unspecified abnormalities of gait and mobility: Secondary | ICD-10-CM | POA: Diagnosis not present

## 2017-11-09 DIAGNOSIS — R001 Bradycardia, unspecified: Secondary | ICD-10-CM | POA: Diagnosis not present

## 2017-11-09 DIAGNOSIS — R531 Weakness: Secondary | ICD-10-CM | POA: Diagnosis not present

## 2017-11-10 ENCOUNTER — Ambulatory Visit (INDEPENDENT_AMBULATORY_CARE_PROVIDER_SITE_OTHER): Payer: Medicare HMO | Admitting: Internal Medicine

## 2017-11-10 ENCOUNTER — Telehealth: Payer: Self-pay | Admitting: Neurology

## 2017-11-10 ENCOUNTER — Other Ambulatory Visit: Payer: Self-pay

## 2017-11-10 ENCOUNTER — Telehealth: Payer: Self-pay | Admitting: Cardiology

## 2017-11-10 ENCOUNTER — Telehealth: Payer: Self-pay | Admitting: Internal Medicine

## 2017-11-10 ENCOUNTER — Encounter: Payer: Self-pay | Admitting: Internal Medicine

## 2017-11-10 VITALS — BP 167/113 | HR 71 | Temp 98.0°F | Ht 68.0 in | Wt 124.0 lb

## 2017-11-10 DIAGNOSIS — G20 Parkinson's disease: Secondary | ICD-10-CM | POA: Diagnosis not present

## 2017-11-10 DIAGNOSIS — I10 Essential (primary) hypertension: Secondary | ICD-10-CM | POA: Diagnosis not present

## 2017-11-10 DIAGNOSIS — R5383 Other fatigue: Secondary | ICD-10-CM

## 2017-11-10 DIAGNOSIS — R441 Visual hallucinations: Secondary | ICD-10-CM

## 2017-11-10 DIAGNOSIS — K029 Dental caries, unspecified: Secondary | ICD-10-CM | POA: Diagnosis not present

## 2017-11-10 DIAGNOSIS — R269 Unspecified abnormalities of gait and mobility: Secondary | ICD-10-CM | POA: Diagnosis not present

## 2017-11-10 DIAGNOSIS — Z681 Body mass index (BMI) 19 or less, adult: Secondary | ICD-10-CM | POA: Diagnosis not present

## 2017-11-10 DIAGNOSIS — I110 Hypertensive heart disease with heart failure: Secondary | ICD-10-CM | POA: Diagnosis not present

## 2017-11-10 DIAGNOSIS — R001 Bradycardia, unspecified: Secondary | ICD-10-CM | POA: Diagnosis not present

## 2017-11-10 DIAGNOSIS — R5381 Other malaise: Secondary | ICD-10-CM | POA: Diagnosis not present

## 2017-11-10 DIAGNOSIS — R634 Abnormal weight loss: Secondary | ICD-10-CM | POA: Diagnosis not present

## 2017-11-10 DIAGNOSIS — F0280 Dementia in other diseases classified elsewhere without behavioral disturbance: Secondary | ICD-10-CM

## 2017-11-10 DIAGNOSIS — R531 Weakness: Secondary | ICD-10-CM | POA: Diagnosis not present

## 2017-11-10 MED ORDER — AMLODIPINE BESYLATE 5 MG PO TABS: 5.0000 mg | ORAL_TABLET | Freq: Every day | ORAL | 1 refills | Status: DC

## 2017-11-10 NOTE — Patient Instructions (Signed)
I will call with the results of the urine studies.

## 2017-11-10 NOTE — Telephone Encounter (Signed)
Spoke with the pts Daughter and endorsed to her that Dr Meda Coffee recommends that we increase her amlodipine to 5  Mg po daily.  Daughter states she has enough 2.5 mg tabs on-hand, and will call us when this supply runs low, to send in the 5 mg tabs to the pts pharmacy of choice.  Daughter verbalized understanding and agrees with this plan.

## 2017-11-10 NOTE — Telephone Encounter (Signed)
   Reason for call:   I received a call from [name]'s daughter at 4  PM indicating that his hospice provider wanted to start him on an antibiotic, specifically Ciprofloxacin, for possible UTI.  She is concerned regarding his Coumadin dosing while on Ciprofloxacin and was told to contact our clinic for further directions.   Pertinent Data:   Patient seen in clinic by PCP today.  His note was reviewed.  Patient currently on Coumadin for anticoagulation, follows in our clinic with Dr Elie Confer and Dr Maudie Mercury for INR monitoring.  Last INR was 2.9 on Feb 4.  No current concerns of bleeding.  Last dose taken today.  Advised her that concern with concomitant dosing of Coumadin and Ciprofloxacin would be increased risk of bleeding and/or increased INR and that therapy should be monitored.  She reports her PCP, Dr Heber Polson, had wanted to collect a urine sample prior to any treatment, but that they have been able to collect anything due to patients incontinence and increased frequency.    Hospice provider recommended starting antibiotics tonight.   Assessment / Plan / Recommendations:   Discussed with her that starting antibiotics prior to collecting urine sample may skew our information, but understand her concern as it appears she has experienced this with him in the past and seen him improve with treatments.  Advised her that I would forward note to her PCP as well as Dr Elie Confer and Dr Maudie Mercury to give her a call regarding further instruction on PT/INR monitoring and/or Coumadin dosing.  She mentioned that in the past home health services (currently in place) have been able to check his INR as getting him to clinic can be challenging.  She also said she would try to contact clinic in the morning prior to her father waking up around 10am.  As always, pt is advised that if symptoms worsen or new symptoms arise, they should go to an urgent care facility or to to ER for further evaluation.    Jule Ser, DO   11/10/2017, 7:34 PM

## 2017-11-10 NOTE — Progress Notes (Signed)
Subjective:  HPI: [name] is a 84 y.o. male who presents for lethargy and elevated blood pressure.  He is accompanied by his daughter [name] [name].  We have recently enrolled [name] [name] into hospice due to his parkinsons and advanced dementia.  [name] [name] however reports that over the last week he has had a change in status.  She notes that he has stopped feeding himself. He will eat when care givers feed him but not on his own.  He does not appear to have oral and abdominal dyscomfort during eating.  She has not noticed any other change in symptoms other that mildly increased lethargy as well as elevated blood pressures.  She notes that she and hospice have kept track of his blood pressures, she has noticed over the last week that readings are higher than usual, 466-599 systolic.  He is taking his medications, and she has no other explanation. [name] [name] notes that [name] [name] has told her on two occasions this week that he is "dying"   Review of Systems: Review of Systems  Constitutional: Positive for malaise/fatigue and weight loss. Negative for chills and fever.       ROS per daughter due to patient's dementia  Gastrointestinal: Negative for abdominal pain, constipation and diarrhea.  Genitourinary: Negative for frequency and hematuria.  Neurological: Negative for focal weakness.  Psychiatric/Behavioral: Positive for hallucinations.    Objective:  Physical Exam: Vitals:   11/10/17 1056 11/10/17 1142  BP: (!) 158/105 (!) 167/113  Pulse: 67 71  Temp: 98 F (36.7 C)   TempSrc: Oral   SpO2: 100%   Weight: 124 lb (56.2 kg)   Height: 5\' 8"  (1.727 m)    Physical Exam  Constitutional: He is well-developed, well-nourished, and in no distress.  Elderly male, in wheelchair, minimally interactive, falls asleep during encounter.  HENT:  Mouth/Throat: Oropharynx is clear and moist.  Dental caries but no signs of acute infection/inflamation  Cardiovascular: Normal rate, regular rhythm and normal heart  sounds.  Pulmonary/Chest: Effort normal and breath sounds normal. No respiratory distress. He has no wheezes. He has no rales.  Abdominal: Soft. Bowel sounds are normal. There is no tenderness. There is no rebound and no guarding.  Musculoskeletal: He exhibits no edema.  Lymphadenopathy:    He has no cervical adenopathy.  Psychiatric:  Appears to have visual hallucination during exam  Nursing note and vitals reviewed.  Assessment & Plan:  Essential hypertension, benign BP elevated, discussed with [name] [name] this could be sign of a stress response, would not treat more aggressively as only mildly elevated and would not palliate him at all.  Lethargy I do not see a clear cause for his subacute decline,  Specifically I do not see any signs of recurrent volvulus on exam.  No abdominal or oral exam findings, lungs clear.  Discussed with [name] [name] that I think this is likely the natural course of his disease process and would focus on symptomatic treatment.   She does feel that he may be "infected" and is most suspicions of UTI, this is reasonable as she has seen him improve in the past with treatments of bacturia.  I did discuss with him that he likely could be colonized and antibiotics do have harms.  However I understand her though process.  We agreed to hold off empiric antibiotics, will obtain UA and urine culture.   Medications Ordered No orders of the defined types were placed in this encounter.  Other Orders Orders Placed This Encounter  Procedures  . Culture, Urine  .  Urinalysis, Reflex Microscopic   Follow Up: Return in about 3 months (around 02/07/2018), or if symptoms worsen or fail to improve.

## 2017-11-10 NOTE — Assessment & Plan Note (Signed)
BP elevated, discussed with [name] this could be sign of a stress response, would not treat more aggressively as only mildly elevated and would not palliate him at all.

## 2017-11-10 NOTE — Telephone Encounter (Signed)
Please increase amlodipine to 5 mg po daily and continue monitoring BPs.

## 2017-11-10 NOTE — Telephone Encounter (Signed)
Message sent to Dr. Sethi. 

## 2017-11-10 NOTE — Assessment & Plan Note (Signed)
I do not see a clear cause for his subacute decline,  Specifically I do not see any signs of recurrent volvulus on exam.  No abdominal or oral exam findings, lungs clear.  Discussed with [name] that I think this is likely the natural course of his disease process and would focus on symptomatic treatment.   She does feel that he may be "infected" and is most suspicions of UTI, this is reasonable as she has seen him improve in the past with treatments of bacturia.  I did discuss with him that he likely could be colonized and antibiotics do have harms.  However I understand her though process.  We agreed to hold off empiric antibiotics, will obtain UA and urine culture.

## 2017-11-10 NOTE — Telephone Encounter (Signed)
Pts daughter called stating that pt has had elevated BP for a while now their PCP cant seem to figure out why and is wanting to know if its a s/e from carbidopa-levodopa (SINEMET IR) 25-100 MG tablet Please call to advise. Pts daughter also aware Dr. Leonie Man is in the hospital this week.

## 2017-11-10 NOTE — Telephone Encounter (Signed)
Kindly inform the patient's daughter that Sinemet does not cause hypertension in fact some people have postural hypotension as a side effect. Have her discuss with primary care physician for further directions

## 2017-11-10 NOTE — Telephone Encounter (Signed)
New message    Patient daughter calling with BP concerns. Please call  Pt c/o BP issue: STAT if pt c/o blurred vision, one-sided weakness or slurred speech  1. What are your last 5 BP readings? 167/103, 142/101, 158/110  2. Are you having any other symptoms (ex. Dizziness, headache, blurred vision, passed out)? NO  3. What is your BP issue? Feels BP too high, request medication adjustment

## 2017-11-10 NOTE — Telephone Encounter (Signed)
Pt was seen today by his PCP for essential HTN and lethargy.  PCP ordered a urinalysis and Urine culture (in Epic) and awaiting these results.  Pt presented to their office for lethargy and essential HTN, benign. BP today at his PCP office was 167/113 and HR-71.  Daughter is calling to get your input if there should be any med adjustments from a cardiac standpoint. Pt is taking amlodipine 2.5 mg po daily.  Pt has history of bradycardia and hypotension.   Daughter is calling all Providers involved in the pts care.  Informed the this will be routed to Dr Meda Coffee for her thoughts and follow-up will be provided accordingly thereafter.

## 2017-11-11 ENCOUNTER — Encounter: Payer: Self-pay | Admitting: Pharmacist

## 2017-11-11 ENCOUNTER — Telehealth: Payer: Self-pay | Admitting: *Deleted

## 2017-11-11 ENCOUNTER — Other Ambulatory Visit: Payer: Medicare HMO

## 2017-11-11 DIAGNOSIS — R5383 Other fatigue: Secondary | ICD-10-CM | POA: Diagnosis not present

## 2017-11-11 DIAGNOSIS — R001 Bradycardia, unspecified: Secondary | ICD-10-CM | POA: Diagnosis not present

## 2017-11-11 DIAGNOSIS — R531 Weakness: Secondary | ICD-10-CM | POA: Diagnosis not present

## 2017-11-11 DIAGNOSIS — R269 Unspecified abnormalities of gait and mobility: Secondary | ICD-10-CM | POA: Diagnosis not present

## 2017-11-11 DIAGNOSIS — F0280 Dementia in other diseases classified elsewhere without behavioral disturbance: Secondary | ICD-10-CM | POA: Diagnosis not present

## 2017-11-11 DIAGNOSIS — I110 Hypertensive heart disease with heart failure: Secondary | ICD-10-CM | POA: Diagnosis not present

## 2017-11-11 DIAGNOSIS — G20 Parkinson's disease: Secondary | ICD-10-CM | POA: Diagnosis not present

## 2017-11-11 NOTE — Progress Notes (Signed)
Patient started cipro for UTI, currently taking warfarin 2.5 mg daily, except 5 mg Mon, Wed, Fri.  Advised to take warfarin 2.5 mg daily, check INR in 3 days.

## 2017-11-11 NOTE — Telephone Encounter (Signed)
[name] calls and states hospice NP started pt on cipro last night, he has had 1 dose, she would like the staff pharmacist to call desaree (NP) at [phone] and also call her, andrea, back and discuss the cipro/ warfarin. Sending to dr's erik hoffman and dr Mannie Stabile, told her dr Maudie Mercury would probably call back

## 2017-11-11 NOTE — Telephone Encounter (Signed)
Patients daughter states she is bringing urine sample in today, will discuss at that time

## 2017-11-12 ENCOUNTER — Emergency Department (HOSPITAL_COMMUNITY): Payer: Medicare Other

## 2017-11-12 ENCOUNTER — Telehealth: Payer: Self-pay | Admitting: Internal Medicine

## 2017-11-12 ENCOUNTER — Other Ambulatory Visit: Payer: Self-pay

## 2017-11-12 ENCOUNTER — Encounter (HOSPITAL_COMMUNITY): Payer: Self-pay | Admitting: *Deleted

## 2017-11-12 ENCOUNTER — Observation Stay (HOSPITAL_COMMUNITY)
Admission: EM | Admit: 2017-11-12 | Discharge: 2017-11-13 | Disposition: A | Discharge status: Home / Self Care | Payer: Medicare Other | Attending: Internal Medicine | Admitting: Internal Medicine

## 2017-11-12 DIAGNOSIS — I10 Essential (primary) hypertension: Secondary | ICD-10-CM | POA: Insufficient documentation

## 2017-11-12 DIAGNOSIS — Z888 Allergy status to other drugs, medicaments and biological substances status: Secondary | ICD-10-CM | POA: Diagnosis not present

## 2017-11-12 DIAGNOSIS — G309 Alzheimer's disease, unspecified: Secondary | ICD-10-CM | POA: Diagnosis not present

## 2017-11-12 DIAGNOSIS — I252 Old myocardial infarction: Secondary | ICD-10-CM | POA: Insufficient documentation

## 2017-11-12 DIAGNOSIS — Z8546 Personal history of malignant neoplasm of prostate: Secondary | ICD-10-CM | POA: Insufficient documentation

## 2017-11-12 DIAGNOSIS — F329 Major depressive disorder, single episode, unspecified: Secondary | ICD-10-CM | POA: Diagnosis not present

## 2017-11-12 DIAGNOSIS — Z86711 Personal history of pulmonary embolism: Secondary | ICD-10-CM | POA: Insufficient documentation

## 2017-11-12 DIAGNOSIS — G20 Parkinson's disease: Secondary | ICD-10-CM | POA: Insufficient documentation

## 2017-11-12 DIAGNOSIS — E870 Hyperosmolality and hypernatremia: Secondary | ICD-10-CM | POA: Diagnosis not present

## 2017-11-12 DIAGNOSIS — Z79899 Other long term (current) drug therapy: Secondary | ICD-10-CM | POA: Insufficient documentation

## 2017-11-12 DIAGNOSIS — Z8249 Family history of ischemic heart disease and other diseases of the circulatory system: Secondary | ICD-10-CM | POA: Insufficient documentation

## 2017-11-12 DIAGNOSIS — Z7901 Long term (current) use of anticoagulants: Secondary | ICD-10-CM | POA: Diagnosis not present

## 2017-11-12 DIAGNOSIS — Z91013 Allergy to seafood: Secondary | ICD-10-CM | POA: Diagnosis not present

## 2017-11-12 DIAGNOSIS — E236 Other disorders of pituitary gland: Secondary | ICD-10-CM | POA: Diagnosis not present

## 2017-11-12 DIAGNOSIS — R402441 Other coma, without documented Glasgow coma scale score, or with partial score reported, in the field [EMT or ambulance]: Secondary | ICD-10-CM | POA: Diagnosis not present

## 2017-11-12 DIAGNOSIS — R4182 Altered mental status, unspecified: Secondary | ICD-10-CM | POA: Diagnosis not present

## 2017-11-12 DIAGNOSIS — R404 Transient alteration of awareness: Secondary | ICD-10-CM

## 2017-11-12 DIAGNOSIS — R627 Adult failure to thrive: Secondary | ICD-10-CM

## 2017-11-12 DIAGNOSIS — R079 Chest pain, unspecified: Secondary | ICD-10-CM | POA: Insufficient documentation

## 2017-11-12 DIAGNOSIS — I2510 Atherosclerotic heart disease of native coronary artery without angina pectoris: Secondary | ICD-10-CM | POA: Insufficient documentation

## 2017-11-12 DIAGNOSIS — E860 Dehydration: Secondary | ICD-10-CM | POA: Insufficient documentation

## 2017-11-12 DIAGNOSIS — M419 Scoliosis, unspecified: Secondary | ICD-10-CM | POA: Diagnosis not present

## 2017-11-12 DIAGNOSIS — K219 Gastro-esophageal reflux disease without esophagitis: Secondary | ICD-10-CM | POA: Insufficient documentation

## 2017-11-12 DIAGNOSIS — R918 Other nonspecific abnormal finding of lung field: Secondary | ICD-10-CM | POA: Diagnosis not present

## 2017-11-12 DIAGNOSIS — F0280 Dementia in other diseases classified elsewhere without behavioral disturbance: Secondary | ICD-10-CM | POA: Insufficient documentation

## 2017-11-12 DIAGNOSIS — I6789 Other cerebrovascular disease: Secondary | ICD-10-CM | POA: Diagnosis not present

## 2017-11-12 LAB — URINALYSIS, ROUTINE W REFLEX MICROSCOPIC
Bilirubin Urine: NEGATIVE
Bilirubin, UA: NEGATIVE
GLUCOSE, UA: NEGATIVE
GLUCOSE, UA: NEGATIVE mg/dL
Hgb urine dipstick: NEGATIVE
KETONES UA: NEGATIVE
KETONES UR: 5 mg/dL — AB
LEUKOCYTES UA: NEGATIVE
Leukocytes, UA: NEGATIVE
NITRITE UA: NEGATIVE
NITRITE: NEGATIVE
PROTEIN: NEGATIVE mg/dL
Protein, UA: NEGATIVE
RBC, UA: NEGATIVE
SPEC GRAV UA: 1.022 (ref 1.005–1.030)
Specific Gravity, Urine: 1.019 (ref 1.005–1.030)
UUROB: 0.2 mg/dL (ref 0.2–1.0)
pH, UA: 5 (ref 5.0–7.5)
pH: 5 (ref 5.0–8.0)

## 2017-11-12 LAB — CBC WITH DIFFERENTIAL/PLATELET
BASOS PCT: 1 %
Basophils Absolute: 0 10*3/uL (ref 0.0–0.1)
EOS ABS: 0.2 10*3/uL (ref 0.0–0.7)
Eosinophils Relative: 5 %
HCT: 42.6 % (ref 39.0–52.0)
HEMOGLOBIN: 13.3 g/dL (ref 13.0–17.0)
Lymphocytes Relative: 42 %
Lymphs Abs: 1.5 10*3/uL (ref 0.7–4.0)
MCH: 31.1 pg (ref 26.0–34.0)
MCHC: 31.2 g/dL (ref 30.0–36.0)
MCV: 99.8 fL (ref 78.0–100.0)
Monocytes Absolute: 0.2 10*3/uL (ref 0.1–1.0)
Monocytes Relative: 6 %
NEUTROS ABS: 1.7 10*3/uL (ref 1.7–7.7)
NEUTROS PCT: 48 %
Platelets: 267 10*3/uL (ref 150–400)
RBC: 4.27 MIL/uL (ref 4.22–5.81)
RDW: 16.8 % — ABNORMAL HIGH (ref 11.5–15.5)
WBC: 3.5 10*3/uL — ABNORMAL LOW (ref 4.0–10.5)

## 2017-11-12 LAB — ETHANOL

## 2017-11-12 LAB — COMPREHENSIVE METABOLIC PANEL
ALK PHOS: 36 U/L — AB (ref 38–126)
ALT: 8 U/L — ABNORMAL LOW (ref 17–63)
ANION GAP: 10 (ref 5–15)
AST: 41 U/L (ref 15–41)
Albumin: 3.3 g/dL — ABNORMAL LOW (ref 3.5–5.0)
BILIRUBIN TOTAL: 0.8 mg/dL (ref 0.3–1.2)
BUN: 12 mg/dL (ref 6–20)
CALCIUM: 8.7 mg/dL — AB (ref 8.9–10.3)
CO2: 24 mmol/L (ref 22–32)
Chloride: 118 mmol/L — ABNORMAL HIGH (ref 101–111)
Creatinine, Ser: 0.93 mg/dL (ref 0.61–1.24)
GFR calc Af Amer: >60 mL/min (ref >60)
GFR calc non Af Amer: >60 mL/min (ref >60)
Glucose, Bld: 83 mg/dL (ref 65–99)
Potassium: 4.1 mmol/L (ref 3.5–5.1)
SODIUM: 152 mmol/L — AB (ref 135–145)
TOTAL PROTEIN: 6.7 g/dL (ref 6.5–8.1)

## 2017-11-12 LAB — URINE CULTURE: ORGANISM ID, BACTERIA: NO GROWTH

## 2017-11-12 LAB — BLOOD GAS, VENOUS
ACID-BASE EXCESS: 1.3 mmol/L (ref 0.0–2.0)
Bicarbonate: 26.5 mmol/L (ref 20.0–28.0)
O2 Saturation: 68.1 %
PH VEN: 7.375 (ref 7.250–7.430)
Patient temperature: 98.6
pCO2, Ven: 46.4 mmHg (ref 44.0–60.0)
pO2, Ven: 38.5 mmHg (ref 32.0–45.0)

## 2017-11-12 LAB — I-STAT CHEM 8, ED
BUN: 12 mg/dL (ref 6–20)
CHLORIDE: 116 mmol/L — AB (ref 101–111)
Calcium, Ion: 1.2 mmol/L (ref 1.15–1.40)
Creatinine, Ser: 1 mg/dL (ref 0.61–1.24)
Glucose, Bld: 96 mg/dL (ref 65–99)
HCT: 40 % (ref 39.0–52.0)
Hemoglobin: 13.6 g/dL (ref 13.0–17.0)
POTASSIUM: 3.8 mmol/L (ref 3.5–5.1)
SODIUM: 154 mmol/L — AB (ref 135–145)
TCO2: 28 mmol/L (ref 22–32)

## 2017-11-12 LAB — BASIC METABOLIC PANEL
Anion gap: 8 (ref 5–15)
BUN: 9 mg/dL (ref 6–20)
CO2: 23 mmol/L (ref 22–32)
Calcium: 8.3 mg/dL — ABNORMAL LOW (ref 8.9–10.3)
Chloride: 116 mmol/L — ABNORMAL HIGH (ref 101–111)
Creatinine, Ser: 0.73 mg/dL (ref 0.61–1.24)
GFR calc Af Amer: >60 mL/min (ref >60)
GFR calc non Af Amer: >60 mL/min (ref >60)
Glucose, Bld: 99 mg/dL (ref 65–99)
Potassium: 3.3 mmol/L — ABNORMAL LOW (ref 3.5–5.1)
Sodium: 147 mmol/L — ABNORMAL HIGH (ref 135–145)

## 2017-11-12 LAB — I-STAT CG4 LACTIC ACID, ED: Lactic Acid, Venous: 1.45 mmol/L (ref 0.5–1.9)

## 2017-11-12 LAB — CBG MONITORING, ED: Glucose-Capillary: 113 mg/dL — ABNORMAL HIGH (ref 65–99)

## 2017-11-12 LAB — PROTIME-INR
INR: 3.34
Prothrombin Time: 33.6 seconds — ABNORMAL HIGH (ref 11.4–15.2)

## 2017-11-12 LAB — AMMONIA: AMMONIA: 28 umol/L (ref 9–35)

## 2017-11-12 LAB — I-STAT TROPONIN, ED: Troponin i, poc: 0.01 ng/mL (ref 0.00–0.08)

## 2017-11-12 MED ORDER — SODIUM CHLORIDE 0.9 % IV BOLUS (SEPSIS)
1000.0000 mL | Freq: Once | INTRAVENOUS | Status: AC
  Administered 2017-11-12: 1000 mL via INTRAVENOUS

## 2017-11-12 MED ORDER — ACETAMINOPHEN 500 MG PO TABS
1000.0000 mg | ORAL_TABLET | Freq: Two times a day (BID) | ORAL | Status: DC
  Administered 2017-11-12 – 2017-11-13 (×2): 1000 mg via ORAL
  Filled 2017-11-12 (×2): qty 2

## 2017-11-12 MED ORDER — WARFARIN - PHARMACIST DOSING INPATIENT: Freq: Every day | Status: DC

## 2017-11-12 MED ORDER — WARFARIN 1.25 MG HALF TABLET
1.2500 mg | ORAL_TABLET | Freq: Once | ORAL | Status: AC
  Administered 2017-11-13: 1.25 mg via ORAL
  Filled 2017-11-12: qty 1

## 2017-11-12 MED ORDER — DEXTROSE-NACL 5-0.45 % IV SOLN
INTRAVENOUS | Status: DC
  Administered 2017-11-12: 21:00:00 via INTRAVENOUS

## 2017-11-12 MED ORDER — CARBIDOPA-LEVODOPA 25-100 MG PO TABS
1.0000 | ORAL_TABLET | Freq: Four times a day (QID) | ORAL | Status: DC
  Administered 2017-11-12 – 2017-11-13 (×4): 1 via ORAL
  Filled 2017-11-12 (×4): qty 1

## 2017-11-12 MED ORDER — POLYETHYLENE GLYCOL 3350 17 G PO PACK: 17.0000 g | PACK | Freq: Every day | ORAL | Status: DC | PRN

## 2017-11-12 MED ORDER — AMLODIPINE BESYLATE 5 MG PO TABS
5.0000 mg | ORAL_TABLET | Freq: Every day | ORAL | Status: DC
Start: 2017-11-13 — End: 2017-11-13
  Administered 2017-11-13: 5 mg via ORAL
  Filled 2017-11-12: qty 1

## 2017-11-12 MED ORDER — DEXTROSE 5 % IV SOLN
INTRAVENOUS | Status: DC
  Administered 2017-11-12: 23:00:00 via INTRAVENOUS

## 2017-11-12 MED ORDER — MEMANTINE HCL ER 28 MG PO CP24
28.0000 mg | ORAL_CAPSULE | Freq: Every day | ORAL | Status: DC
  Administered 2017-11-13: 28 mg via ORAL
  Filled 2017-11-12: qty 1

## 2017-11-12 MED ORDER — DEXTROSE-NACL 5-0.45 % IV SOLN: INTRAVENOUS | Status: DC

## 2017-11-12 NOTE — ED Notes (Signed)
Pt's daughter and Anne Shutter, phone number: [phone].  Requested to be updated if being admitted, discharged, or anything else.

## 2017-11-12 NOTE — ED Triage Notes (Signed)
Pt bib EMS and coming from home.  Pt hx dementia.  Pt is normally to talk and feed himself.  Yesterday pt was dx with a UTI and started on Cipro.  Pt has decreased level of activity and decreased mental status. EMS noted a RBBB on EKG.   EMS VS: HR: 70, 126/80, RR: 14, Temp: 99.9, 98%RA  CBG: 134

## 2017-11-12 NOTE — Progress Notes (Signed)
ANTICOAGULATION CONSULT NOTE - Initial Consult  Pharmacy Consult for warfarin Indication: history of PE  Allergies  Allergen Reactions  . Aricept [Donepezil Hcl] Other (See Comments)    Symptomatic Bradycardia.  . Other Other (See Comments)    Shrimp gives him gout  . Shellfish-Derived Products Other (See Comments)    Causes a flare up with Gout    Patient Measurements: Height: 5\' 8"  (172.7 cm) Weight: 136 lb (61.7 kg) IBW/kg (Calculated) : 68.4  Vital Signs: Temp: 98.3 F (36.8 C) (02/23 2125) Temp Source: Oral (02/23 2125) BP: 146/97 (02/23 2125) Pulse Rate: 66 (02/23 2125)  Labs: Recent Labs    11/12/17 1656 11/12/17 1706 11/12/17 1753  HGB 13.3 13.6  --   HCT 42.6 40.0  --   PLT 267  --   --   LABPROT 33.6*  --   --   INR 3.34  --   --   CREATININE  --  1.00 0.93    Estimated Creatinine Clearance: 51.6 mL/min (by C-G formula based on SCr of 0.93 mg/dL).   Medical History: Past Medical History:  Diagnosis Date  . Anxiety   . Coronary artery disease, non-occlusive    with history of MI; Cath 2008 w multivessel nonobstructive CAD  . Dementia   . Depression   . GERD (gastroesophageal reflux disease)   . Hyperlipidemia   . Hypertension   . Memory loss   . Parkinson disease ([place])   . PE (pulmonary embolism)    unprovoked PE completed 6 months of warfarin, warfarin d/ced 02/12/2010, repeat PE 07/10/2010 after a long car ride and now on lifelong coumadin.  . Pituitary microadenoma ([place])    incidental finding CT 12/2009  . Prostate cancer ([place])   . Scoliosis   . Sigmoid volvulus ([place]) 08/02/2016  . Volvulus of colon ([place]) 06/01/2017     Assessment: 84 yo male on warfarin for a history of PE. He has been on ciprofloxacin lately for a UTI; therefore he has had some changes in his warfarin dose prior to admission. Current INR 3.3.  PTA warfarin: 5 mg Mon/Wed, 2.5 mg all other days  Goal of Therapy:  Heparin level 0.3-0.7 units/ml Monitor platelets by  anticoagulation protocol: Yes    Plan:  -Warfarin 1.25 mg po x1 -Daily INR   Malayshia All, Jake Church 11/12/2017,10:59 PM

## 2017-11-12 NOTE — ED Notes (Signed)
ED TO INPATIENT HANDOFF REPORT  Name/Age/Gender [name] [name] 84 y.o. male  Code Status Code Status History    Date Active Date Inactive Code Status Order ID Comments User Context   05/31/2017 02:16 06/14/2017 20:09 Full Code [id number]  Ledell Noss, MD Inpatient   08/02/2016 04:28 08/08/2016 19:52 Full Code [id number]  Jule Ser, DO Inpatient   07/09/2016 05:28 07/13/2016 21:47 Full Code [id number]  Juliet Rude, MD Inpatient   09/16/2015 01:14 09/18/2015 16:41 Full Code [id number]  Juliet Rude, MD Inpatient   12/11/2014 09:20 12/18/2014 18:27 Full Code [id number]  [name], MD Inpatient   11/04/2011 01:12 11/05/2011 17:35 Full Code [id number]  Sprague, Philis Nettle, RN Inpatient      Home/SNF/Other Home  Chief Complaint altered mental status  Level of Care/Admitting Diagnosis ED Disposition    ED Disposition Condition [place]: [facility] [[id number]]  Level of Care: Med-Surg [16]  Diagnosis: Altered mental status [780.97.ICD-9-CM]  Admitting Physician: Oda Kilts [[id number]]  Attending Physician: Oda Kilts [[id number]]  PT Class (Do Not Modify): Observation [104]  PT Acc Code (Do Not Modify): Observation [10022]       Medical History Past Medical History:  Diagnosis Date  . Anxiety   . Coronary artery disease, non-occlusive    with history of MI; Cath 2008 w multivessel nonobstructive CAD  . Dementia   . Depression   . GERD (gastroesophageal reflux disease)   . Hyperlipidemia   . Hypertension   . Memory loss   . Parkinson disease ([place])   . PE (pulmonary embolism)    unprovoked PE completed 6 months of warfarin, warfarin d/ced 02/12/2010, repeat PE 07/10/2010 after a long car ride and now on lifelong coumadin.  . Pituitary microadenoma ([place])    incidental finding CT 12/2009  . Prostate cancer ([place])   . Scoliosis   . Sigmoid volvulus ([place]) 08/02/2016  . Volvulus of colon ([place]) 06/01/2017     Allergies Allergies  Allergen Reactions  . Aricept [Donepezil Hcl] Other (See Comments)    Symptomatic Bradycardia.  . Other Other (See Comments)    Shrimp gives him gout  . Shellfish-Derived Products Other (See Comments)    Causes a flare up with Gout    IV Location/Drains/Wounds Patient Lines/Drains/Airways Status   Active Line/Drains/Airways    Name:   Placement date:   Placement time:   Site:   Days:   Peripheral IV 11/12/17 Left Hand   11/12/17    1614    Hand   less than 1   Peripheral IV 11/12/17 Antecubital   11/12/17    1701    Antecubital   less than 1   External Urinary Catheter   06/11/17    2232    -   154   Incision (Closed) 06/08/17 Abdomen Other (Comment)   06/08/17    1455     157          Labs/Imaging Results for orders placed or performed during the hospital encounter of 11/12/17 (from the past 48 hour(s))  Urinalysis, Routine w reflex microscopic     Status: Abnormal   Collection Time: 11/12/17  4:14 PM  Result Value Ref Range   Color, Urine YELLOW YELLOW   APPearance CLEAR CLEAR   Specific Gravity, Urine 1.019 1.005 - 1.030   pH 5.0 5.0 - 8.0   Glucose, UA NEGATIVE NEGATIVE mg/dL   Hgb urine dipstick NEGATIVE NEGATIVE   Bilirubin Urine  NEGATIVE NEGATIVE   Ketones, ur 5 (A) NEGATIVE mg/dL   Protein, ur NEGATIVE NEGATIVE mg/dL   Nitrite NEGATIVE NEGATIVE   Leukocytes, UA NEGATIVE NEGATIVE    Comment: Performed at [facility], [place] [address] [place], [place] [address]  CBG monitoring, ED     Status: Abnormal   Collection Time: 11/12/17  4:21 PM  Result Value Ref Range   Glucose-Capillary 113 (H) 65 - 99 mg/dL  CBC WITH DIFFERENTIAL     Status: Abnormal   Collection Time: 11/12/17  4:56 PM  Result Value Ref Range   WBC 3.5 (L) 4.0 - 10.5 K/uL   RBC 4.27 4.22 - 5.81 MIL/uL   Hemoglobin 13.3 13.0 - 17.0 g/dL   HCT 42.6 39.0 - 52.0 %   MCV 99.8 78.0 - 100.0 fL   MCH 31.1 26.0 - 34.0 pg   MCHC 31.2 30.0 - 36.0 g/dL   RDW  16.8 (H) 11.5 - 15.5 %   Platelets 267 150 - 400 K/uL   Neutrophils Relative % 48 %   Neutro Abs 1.7 1.7 - 7.7 K/uL   Lymphocytes Relative 42 %   Lymphs Abs 1.5 0.7 - 4.0 K/uL   Monocytes Relative 6 %   Monocytes Absolute 0.2 0.1 - 1.0 K/uL   Eosinophils Relative 5 %   Eosinophils Absolute 0.2 0.0 - 0.7 K/uL   Basophils Relative 1 %   Basophils Absolute 0.0 0.0 - 0.1 K/uL    Comment: Performed at [facility], [place] [address] [place], [place] [address]  Ammonia     Status: None   Collection Time: 11/12/17  4:56 PM  Result Value Ref Range   Ammonia 28 9 - 35 umol/L    Comment: Performed at [facility], [place] [address] [place], [place] [address]  Ethanol     Status: None   Collection Time: 11/12/17  4:56 PM  Result Value Ref Range   Alcohol, Ethyl (B) <10 <10 mg/dL    Comment:        LOWEST DETECTABLE LIMIT FOR SERUM ALCOHOL IS 10 mg/dL FOR MEDICAL PURPOSES ONLY Performed at [place] [address], [place], [place] [address]   Protime-INR     Status: Abnormal   Collection Time: 11/12/17  4:56 PM  Result Value Ref Range   Prothrombin Time 33.6 (H) 11.4 - 15.2 seconds   INR 3.34     Comment: Performed at [facility], [place] [address], [place], [place] [address]  I-Stat CG4 Lactic Acid, ED     Status: None   Collection Time: 11/12/17  5:05 PM  Result Value Ref Range   Lactic Acid, Venous 1.45 0.5 - 1.9 mmol/L  I-Stat Chem 8, ED     Status: Abnormal   Collection Time: 11/12/17  5:06 PM  Result Value Ref Range   Sodium 154 (H) 135 - 145 mmol/L   Potassium 3.8 3.5 - 5.1 mmol/L   Chloride 116 (H) 101 - 111 mmol/L   BUN 12 6 - 20 mg/dL   Creatinine, Ser 1.00 0.61 - 1.24 mg/dL   Glucose, Bld 96 65 - 99 mg/dL   Calcium, Ion 1.20 1.15 - 1.40 mmol/L   TCO2 28 22 - 32 mmol/L   Hemoglobin 13.6 13.0 - 17.0 g/dL   HCT 40.0 39.0 - 52.0 %  Blood gas, venous     Status: None   Collection Time: 11/12/17   5:15 PM  Result Value  Ref Range   O2 Content ROOM AIR L/min   pH, Ven 7.375 7.250 - 7.430   pCO2, Ven 46.4 44.0 - 60.0 mmHg   pO2, Ven 38.5 32.0 - 45.0 mmHg   Bicarbonate 26.5 20.0 - 28.0 mmol/L   Acid-Base Excess 1.3 0.0 - 2.0 mmol/L   O2 Saturation 68.1 %   Patient temperature 98.6    Collection site VEIN    Drawn by COLLECTED BY LABORATORY    Sample type VEIN     Comment: Performed at [facility], [place] [address] [place], [place] [address]  Comprehensive metabolic panel     Status: Abnormal   Collection Time: 11/12/17  5:53 PM  Result Value Ref Range   Sodium 152 (H) 135 - 145 mmol/L   Potassium 4.1 3.5 - 5.1 mmol/L   Chloride 118 (H) 101 - 111 mmol/L   CO2 24 22 - 32 mmol/L   Glucose, Bld 83 65 - 99 mg/dL   BUN 12 6 - 20 mg/dL   Creatinine, Ser 0.93 0.61 - 1.24 mg/dL   Calcium 8.7 (L) 8.9 - 10.3 mg/dL   Total Protein 6.7 6.5 - 8.1 g/dL   Albumin 3.3 (L) 3.5 - 5.0 g/dL   AST 41 15 - 41 U/L   ALT 8 (L) 17 - 63 U/L   Alkaline Phosphatase 36 (L) 38 - 126 U/L   Total Bilirubin 0.8 0.3 - 1.2 mg/dL   GFR calc non Af Amer >60 >60 mL/min   GFR calc Af Amer >60 >60 mL/min    Comment: (NOTE) The eGFR has been calculated using the CKD EPI equation. This calculation has not been validated in all clinical situations. eGFR's persistently <60 mL/min signify possible Chronic Kidney Disease.    Anion gap 10 5 - 15    Comment: Performed at [facility], [place] [address], [place], [place] [address]  I-Stat Troponin, ED (not at [facility])     Status: None   Collection Time: 11/12/17  6:14 PM  Result Value Ref Range   Troponin i, poc 0.01 0.00 - 0.08 ng/mL   Comment 3            Comment: Due to the release kinetics of cTnI, a negative result within the first hours of the onset of symptoms does not rule out myocardial infarction with certainty. If myocardial infarction is still suspected, repeat the test at appropriate intervals.    Dg Chest 2  View  Result Date: 11/12/2017 CLINICAL DATA:  Pt c/o chest pain to patient's daughter, elevated BP for the last few days. Pt hx dementia. Yesterday pt was dx with a UTI and started on Cipro. Pt has decreased level of activity and decreased mental status. EXAM: CHEST  2 VIEW COMPARISON:  05/30/2017 FINDINGS: Lung volumes are low, more notable on the left due to elevation of left hemidiaphragm. There is mild lung base opacity, also greater on the left, consistent with atelectasis. These findings are similar to the prior study. Remainder of the lungs is clear. No pleural effusion. No pneumothorax. Cardiac silhouette is normal in size. No mediastinal or hilar masses. Skeletal structures are demineralized but grossly intact. IMPRESSION: 1. Lung base opacity, left greater than right, most consistent with atelectasis, accentuated by low lung volumes and relative elevation of left hemidiaphragm. 2. No convincing pneumonia.  No evidence of pulmonary edema. Electronically Signed   By: Lajean Manes M.D.   On: 11/12/2017 17:18   Ct Head Wo Contrast  Result Date: 11/12/2017 CLINICAL DATA:  decreased level of activity and decreased mental status. EXAM: CT HEAD WITHOUT CONTRAST TECHNIQUE: Contiguous axial images were obtained from the base of the skull through the vertex without intravenous contrast. COMPARISON:  10/08/2016 FINDINGS: Brain: The ventricles are normal in configuration. There is ventricular and sulcal enlargement reflecting mild diffuse atrophy. Patchy white matter hypoattenuation is noted consistent with advanced chronic microvascular ischemic change. There is a small old lacune infarct in the right thalamus. There is no evidence of a recent infarct. There are no parenchymal masses or mass effect. There is enlargement of the pituitary gland with subsequent enlargement of the sella turcica. This is likely an adenoma. It is unchanged from the prior CT. No other extra-axial abnormality. No intracranial  hemorrhage. Vascular: No hyperdense vessel or unexpected calcification. Skull: Normal. Negative for fracture or focal lesion. Sinuses/Orbits: Globes and orbits are unremarkable. Visualized sinuses and mastoid air cells are clear. Other: None. IMPRESSION: 1. No acute intracranial abnormalities. 2. Atrophy and advanced chronic microvascular ischemic change, stable from the prior CT. 3. Enlarged pituitary gland likely a macro adenoma, similar to the prior CT. Electronically Signed   By: Lajean Manes M.D.   On: 11/12/2017 17:34    Pending Labs Unresulted Labs (From admission, onward)   Start     Ordered   11/12/17 1614  Urine culture  STAT,   STAT     11/12/17 1614      Vitals/Pain Today's Vitals   11/12/17 1730 11/12/17 1800 11/12/17 1830 11/12/17 1923  BP: (!) 142/95 (!) 142/98 (!) 151/91 (!) 169/94  Pulse: 62 60 (!) 59 77  Resp: (!) 9 (!) _0 Temp:    (!) 97.4 F (36.3 C)  TempSrc:    Oral  SpO2: 97% 98% 97% 99%  Weight:      Height:        Isolation Precautions No active isolations  Medications Medications  dextrose 5 %-0.45 % sodium chloride infusion (not administered)  sodium chloride 0.9 % bolus 1,000 mL (0 mLs Intravenous Stopped 11/12/17 1925)    Mobility Unsure, will have to speak with family

## 2017-11-12 NOTE — ED Provider Notes (Addendum)
[facility] DEPT Provider Note   CSN: [id number] Arrival date & time: 11/12/17  1546     History   Chief Complaint Chief Complaint  Patient presents with  . Altered Mental Status    HPI [name] [name] is a 84 y.o. male.  84 yo M with a chief complaint of altered mental status.  Per EMS the patient has had worsening over the past week.  Recently saw the family doctor and was diagnosed with a urinary tract infection and sent home with antibiotics.  The family feels that they are having trouble taking care of him as he no longer is able to eat and drink and no longer is been able to carry on a conversation.  Lives at home.  No fevers been noticed.  Was started on Cipro.  The patient is unable to carry on a conversation with me.  Level 5 caveat altered mental status.   The history is provided by the EMS personnel and a relative.  Altered Mental Status   This is a new problem. The current episode started 2 days ago. The problem has been gradually worsening. Pertinent negatives include no confusion.    Past Medical History:  Diagnosis Date  . Anxiety   . Coronary artery disease, non-occlusive    with history of MI; Cath 2008 w multivessel nonobstructive CAD  . Dementia   . Depression   . GERD (gastroesophageal reflux disease)   . Hyperlipidemia   . Hypertension   . Memory loss   . Parkinson disease ([place])   . PE (pulmonary embolism)    unprovoked PE completed 6 months of warfarin, warfarin d/ced 02/12/2010, repeat PE 07/10/2010 after a long car ride and now on lifelong coumadin.  . Pituitary microadenoma ([place])    incidental finding CT 12/2009  . Prostate cancer ([place])   . Scoliosis   . Sigmoid volvulus ([place]) 08/02/2016  . Volvulus of colon ([place]) 06/01/2017    Patient Active Problem List   Diagnosis Date Noted  . Lethargy 11/10/2017  . Fatigue 09/08/2017  . Need for immunization against influenza 09/08/2017  . Recurrent UTI 06/27/2017  .  Pressure sore on buttocks 06/27/2017  . Generalized abdominal pain   . Volvulus of sigmoid colon ([place]) 05/31/2017  . Aortic atherosclerosis ([place]) 05/31/2017  . Loss of weight 09/23/2016  . Abdominal distension   . Long term current use of anticoagulant   . Right bundle branch block   . Hypokalemia 08/02/2016  . Diverticulosis 07/15/2016  . Statin myopathy 03/15/2016  . Parkinsonian syndrome ([place]) 08/11/2015  . Normocytic anemia, not due to blood loss 04/16/2015  . Chronic systolic heart failure ([place]) 04/16/2015  . Falls frequently 04/16/2015  . Onychomycosis 04/15/2015  . CAD (coronary artery disease) 01/16/2015  . 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 Healthcare maintenance 10/10/2014  . Lumbosacral spondylosis without myelopathy 09/24/2013  . Altered mental status 02/20/2013  . Bradycardia 02/20/2013  . Generalized ischemic cerebrovascular disease 02/05/2013  . Depression 01/25/2013  . Insomnia 01/20/2012  . Alzheimer's dementia 08/17/2011  . History of pulmonary embolism 09/18/2010  . Encounter for long-term use of antiplatelets/antithrombotics 09/18/2010  . DJD (degenerative joint disease) 08/06/2010  . ADENOCARCINOMA, PROSTATE, HX OF 08/06/2010  . HLD (hyperlipidemia) 10/15/2009  . Essential hypertension, benign 10/15/2009  . GERD 10/15/2009    Past Surgical History:  Procedure Laterality Date  . CATARACT EXTRACTION    . COLONOSCOPY N/A 05/30/2017   Procedure: COLONOSCOPY;  Surgeon: Ronald Lobo, MD;  Location: [facility];  Service: Endoscopy;  Laterality: N/A;  . FLEXIBLE SIGMOIDOSCOPY N/A 08/02/2016   Procedure: FLEXIBLE SIGMOIDOSCOPY;  Surgeon: Ronald Lobo, MD;  Location: [facility] ENDOSCOPY;  Service: Endoscopy;  Laterality: N/A;  . FLEXIBLE SIGMOIDOSCOPY N/A 08/06/2016   Procedure: FLEXIBLE SIGMOIDOSCOPY;  Surgeon: Ronald Lobo, MD;  Location: [facility] ENDOSCOPY;  Service: Endoscopy;  Laterality: N/A;  . FLEXIBLE SIGMOIDOSCOPY Left 06/02/2017   Procedure: FLEXIBLE  SIGMOIDOSCOPY;  Surgeon: Arta Silence, MD;  Location: [facility] ENDOSCOPY;  Service: Endoscopy;  Laterality: Left;  . FLEXIBLE SIGMOIDOSCOPY N/A 06/07/2017   Procedure: FLEXIBLE SIGMOIDOSCOPY for possible decompression;  Surgeon: Otis Brace, MD;  Location: [facility];  Service: Gastroenterology;  Laterality: N/A;  . LEFT HEART CATHETERIZATION WITH CORONARY ANGIOGRAM N/A 12/10/2014   Procedure: LEFT HEART CATHETERIZATION WITH CORONARY ANGIOGRAM;  Surgeon: [name], MD;  Location: [facility] CATH LAB;  Service: Cardiovascular;  Laterality: N/A;  . LOOP RECORDER IMPLANT N/A 12/16/2014   Procedure: LOOP RECORDER IMPLANT;  Surgeon: [name], MD;  Location: [facility] CATH LAB;  Service: Cardiovascular;  Laterality: N/A;  . PARTIAL COLECTOMY N/A 06/08/2017   Procedure: SIGMOID  COLECTOMY;  Surgeon: Georganna Skeans, MD;  Location: [facility];  Service: General;  Laterality: N/A;  . PROSTATECTOMY         Home Medications    Prior to Admission medications   Medication Sig Start Date End Date Taking? Authorizing Provider  acetaminophen (TYLENOL) 500 MG tablet Take 1,000 mg by mouth 2 (two) times daily.    Yes [provider]  amLODipine (NORVASC) 5 MG tablet Take 1 tablet (5 mg total) by mouth daily. 11/10/17 02/08/18 Yes Dorothy Spark, MD  b complex vitamins tablet Take 1 tablet by mouth every morning.    Yes [provider]  Ca Phosphate-Cholecalciferol (CALCIUM/VITAMIN D3 GUMMIES) 250-350 MG-UNIT CHEW Chew 1 Dose by mouth 2 (two) times daily. 07/08/16  Yes [name], DO  carbidopa-levodopa (SINEMET IR) 25-100 MG tablet Take 1 tablet by mouth 4 (four) times daily. 10/28/17  Yes [name], DO  ciprofloxacin (CIPRO) 500 MG tablet Take 500 mg by mouth 2 (two) times daily.   Yes [provider]  fish oil-omega-3 fatty acids 1000 MG capsule Take 2 g by mouth every morning.    Yes [provider]  memantine (NAMENDA XR) 28 MG CP24 24 hr capsule Take 1 capsule (28  mg total) by mouth every morning. 04/25/17  Yes Garvin Fila, MD  Multiple Vitamins-Minerals (MULTIVITAMIN WITH MINERALS) tablet Take 1 tablet by mouth every morning.    Yes [provider]  polyethylene glycol (MIRALAX / GLYCOLAX) packet Take 17 g by mouth daily as needed (constipation).   Yes [provider]  potassium chloride SA (KLOR-CON M20) 20 MEQ tablet Take 2 tablets (40 mEq total) by mouth daily. 09/29/17  Yes [name], DO  QUEtiapine (SEROQUEL) 25 MG tablet Take 1 tablet (25 mg total) by mouth at bedtime. May use half a tablet 12.5 mg during the daytime as needed for agitation/hallucinations Patient taking differently: Take 12.5 mg by mouth as needed (sleep). May use half a tablet 12.5 mg during the daytime as needed for agitation/hallucinations 04/05/17  Yes Garvin Fila, MD  vitamin C (ASCORBIC ACID) 500 MG tablet Take 1,000 mg by mouth every morning.    Yes [provider]  warfarin (COUMADIN) 5 MG tablet Take 0.5-1 tablets (2.5-5 mg total) by mouth See admin instructions. Take 1 tablet on Mon, and Fri. Take 1/2 tablet all other  days Patient taking differently: Take 2.5-5 mg by mouth See admin instructions. 5mg  Mon, Wed - 2.5mg  all other days 05/26/17  Yes [name], DO  senna (SENOKOT) 8.6 MG TABS tablet Take 1 tablet (8.6 mg total) by mouth daily as needed for mild constipation. Patient not taking: Reported on 11/12/2017 07/13/16   Velna Ochs, MD    Family History Family History  Problem Relation Age of Onset  . Hypertension Father        Passed away from cerebral hemorrhage at age of 38.  [name] Dementia Mother        Passed away  . Hypertension Child        4 adult children  . Cervical cancer Other   . Lung cancer Other   . Stroke Other   . Heart attack Neg Hx     Social History Social History   Tobacco Use  . Smoking status: Never Smoker  . Smokeless tobacco: Never Used  Substance Use Topics  . Alcohol use: No     Alcohol/week: 0.0 oz  . Drug use: No     Allergies   Aricept [donepezil hcl]; Other; and Shellfish-derived products   Review of Systems Review of Systems  Constitutional: Positive for activity change and appetite change. Negative for chills and fever.  HENT: Negative for congestion and facial swelling.   Eyes: Negative for discharge and visual disturbance.  Respiratory: Negative for shortness of breath.   Cardiovascular: Negative for chest pain and palpitations.  Gastrointestinal: Negative for abdominal pain, diarrhea and vomiting.  Musculoskeletal: Negative for arthralgias and myalgias.  Skin: Negative for color change and rash.  Neurological: Negative for tremors, syncope and headaches.  Psychiatric/Behavioral: Negative for confusion and dysphoric mood.     Physical Exam Updated Vital Signs BP (!) 159/93 (BP Location: Left Arm)   Pulse 77   Temp (!) 97.4 F (36.3 C) (Oral)   Resp 11   Ht 5\' 8"  (1.727 m)   Wt 61.7 kg (136 lb)   SpO2 98%   BMI 20.68 kg/m   Physical Exam  Constitutional: He is oriented to person, place, and time. He appears well-developed and well-nourished.  HENT:  Head: Normocephalic and atraumatic.  Eyes: EOM are normal. Pupils are equal, round, and reactive to light.  Neck: Normal range of motion. Neck supple. No JVD present.  Cardiovascular: Normal rate and regular rhythm. Exam reveals no gallop and no friction rub.  No murmur heard. Pulmonary/Chest: No respiratory distress. He has no wheezes.  Abdominal: He exhibits no distension and no mass. There is no tenderness. There is no rebound and no guarding.  Musculoskeletal: Normal range of motion.  Neurological: He is alert and oriented to person, place, and time. GCS eye subscore is 4. GCS verbal subscore is 3. GCS motor subscore is 6.  Incoherent speech  Skin: No rash noted. No pallor.  Psychiatric: He has a normal mood and affect. His behavior is normal.  Nursing note and vitals  reviewed.    ED Treatments / Results  Labs (all labs ordered are listed, but only abnormal results are displayed) Labs Reviewed  CBC WITH DIFFERENTIAL/PLATELET - Abnormal; Notable for the following components:      Result Value   WBC 3.5 (*)    RDW 16.8 (*)    All other components within normal limits  URINALYSIS, ROUTINE W REFLEX MICROSCOPIC - Abnormal; Notable for the following components:   Ketones, ur 5 (*)    All other components within normal  limits  PROTIME-INR - Abnormal; Notable for the following components:   Prothrombin Time 33.6 (*)    All other components within normal limits  COMPREHENSIVE METABOLIC PANEL - Abnormal; Notable for the following components:   Sodium 152 (*)    Chloride 118 (*)    Calcium 8.7 (*)    Albumin 3.3 (*)    ALT 8 (*)    Alkaline Phosphatase 36 (*)    All other components within normal limits  CBG MONITORING, ED - Abnormal; Notable for the following components:   Glucose-Capillary 113 (*)    All other components within normal limits  I-STAT CHEM 8, ED - Abnormal; Notable for the following components:   Sodium 154 (*)    Chloride 116 (*)    All other components within normal limits  URINE CULTURE  AMMONIA  ETHANOL  BLOOD GAS, VENOUS  I-STAT CG4 LACTIC ACID, ED  I-STAT TROPONIN, ED    EKG  EKG Interpretation None       Radiology Dg Chest 2 View  Result Date: 11/12/2017 CLINICAL DATA:  Pt c/o chest pain to patient's daughter, elevated BP for the last few days. Pt hx dementia. Yesterday pt was dx with a UTI and started on Cipro. Pt has decreased level of activity and decreased mental status. EXAM: CHEST  2 VIEW COMPARISON:  05/30/2017 FINDINGS: Lung volumes are low, more notable on the left due to elevation of left hemidiaphragm. There is mild lung base opacity, also greater on the left, consistent with atelectasis. These findings are similar to the prior study. Remainder of the lungs is clear. No pleural effusion. No pneumothorax.  Cardiac silhouette is normal in size. No mediastinal or hilar masses. Skeletal structures are demineralized but grossly intact. IMPRESSION: 1. Lung base opacity, left greater than right, most consistent with atelectasis, accentuated by low lung volumes and relative elevation of left hemidiaphragm. 2. No convincing pneumonia.  No evidence of pulmonary edema. Electronically Signed   By: Lajean Manes M.D.   On: 11/12/2017 17:18   Ct Head Wo Contrast  Result Date: 11/12/2017 CLINICAL DATA:  decreased level of activity and decreased mental status. EXAM: CT HEAD WITHOUT CONTRAST TECHNIQUE: Contiguous axial images were obtained from the base of the skull through the vertex without intravenous contrast. COMPARISON:  10/08/2016 FINDINGS: Brain: The ventricles are normal in configuration. There is ventricular and sulcal enlargement reflecting mild diffuse atrophy. Patchy white matter hypoattenuation is noted consistent with advanced chronic microvascular ischemic change. There is a small old lacune infarct in the right thalamus. There is no evidence of a recent infarct. There are no parenchymal masses or mass effect. There is enlargement of the pituitary gland with subsequent enlargement of the sella turcica. This is likely an adenoma. It is unchanged from the prior CT. No other extra-axial abnormality. No intracranial hemorrhage. Vascular: No hyperdense vessel or unexpected calcification. Skull: Normal. Negative for fracture or focal lesion. Sinuses/Orbits: Globes and orbits are unremarkable. Visualized sinuses and mastoid air cells are clear. Other: None. IMPRESSION: 1. No acute intracranial abnormalities. 2. Atrophy and advanced chronic microvascular ischemic change, stable from the prior CT. 3. Enlarged pituitary gland likely a macro adenoma, similar to the prior CT. Electronically Signed   By: Lajean Manes M.D.   On: 11/12/2017 17:34    Procedures Procedures (including critical care time)  Medications Ordered  in ED Medications  dextrose 5 %-0.45 % sodium chloride infusion (not administered)  sodium chloride 0.9 % bolus 1,000 mL (0 mLs Intravenous Stopped  11/12/17 1925)     Initial Impression / Assessment and Plan / ED Course  I have reviewed the triage vital signs and the nursing notes.  Pertinent labs & imaging results that were available during my care of the patient were reviewed by me and considered in my medical decision making (see chart for details).     84 yo M with a chief complaint of altered mental status.  Going on for the past week.  No focal neurologic deficits.  Patient likely has an encephalopathy secondary to his urinary tract infection.  Will obtain a further altered mental status workup.  Give IV fluids.  Reassess.  Patient's labs reviewed likely concentrated.  Will give IV fluids.  Discussed with the internal medicine residency group who agreed that he should be placed into the hospital.  The patients results and plan were reviewed and discussed.   Any x-rays performed were independently reviewed by myself.   Differential diagnosis were considered with the presenting HPI.  Medications  dextrose 5 %-0.45 % sodium chloride infusion (not administered)  sodium chloride 0.9 % bolus 1,000 mL (0 mLs Intravenous Stopped 11/12/17 1925)    Vitals:   11/12/17 1800 11/12/17 1830 11/12/17 1923 11/12/17 2031  BP: (!) 142/98 (!) 151/91 (!) 169/94 (!) 159/93  Pulse: 60 (!) 59 77   Resp: (!) 9 10 12 11   Temp:   (!) 97.4 F (36.3 C)   TempSrc:   Oral   SpO2: 98% 97% 99% 98%  Weight:      Height:        Final diagnoses:  Failure to thrive in adult  Transient alteration of awareness    Admission/ observation were discussed with the admitting physician, patient and/or family and they are comfortable with the plan.    Final Clinical Impressions(s) / ED Diagnoses   Final diagnoses:  Failure to thrive in adult  Transient alteration of awareness    ED Discharge Orders     None       Deno Etienne, DO 11/12/17 2046    Deno Etienne, DO 11/12/17 2047

## 2017-11-12 NOTE — H&P (Addendum)
Date: 11/12/2017               Patient Name:  [name] [name] MRN: [id number]  DOB: 26-Jan-1931 Age / Sex: 84 y.o., male   PCP: [name], DO         Medical Service: Internal Medicine Teaching Service         Attending Physician: Dr. Rebeca Alert Raynaldo Opitz, MD    First Contact: Dr. Ronalee Red Pager: [phone]  Second Contact: Dr. Philipp Ovens Pager: [phone]       After Hours (After 5p/  First Contact Pager: [phone]  weekends / holidays): Second Contact Pager: [phone]   Chief Complaint: Altered Mental Status   History of Present Illness: [name] [name] is a 84 y.o male with parkinson's disease and dementia who presented to the ED with 5 days of AMS. Patient is altered and believes he is currently at home. Unable to answer questions so the history was obtained from his daughter, [name] [name], at bedside.   Approximately 5 days prior to presentation the patient's daughter and his CNA noticed that he was not eating as well and that his BP was running higher than his baseline. The noted that it was elevated in the 160-170s independent of position when it typically runs in the 140-150s. She called his cardiologist who increased the patient Amlodipine to 5 mg QD. He then stopped feeding himself and required the aid to feed him all his meals. His strength then diminished and it struggled to walk. He presented to clinic on 2/21 because family was concerned he had a UTI because of similar presentation in the past. In the clinic UA and cultures were ordered but could not be obtained due to patient's inability to void. On the 22nd the palliative care nurse was concerned that he had a UTI a recommend prescribing Ciprofloxacin. Urine sample was brought to the clinic prior to initiation of ciprofloxacin.   In general, the patient has been struggling with decrease fluid intake from the past several weeks and often does not drink much water. He continues to eat 2 of 3 meals per day. Daughter states that she has  noticed a dramatic decline in function this week but prior to had not noticed much of a decline. The daughter stresses that the patient is not hospice and is a FULL CODE.   She denies fevers, chills, N/V, diarrhea, abdominal pain, cough, rhinorrhea. Does states that the patient was complaining of chest pain today prior to the ambulance arriving.  Meds:  Current Meds  Medication Sig  . acetaminophen (TYLENOL) 500 MG tablet Take 1,000 mg by mouth 2 (two) times daily.   [name] amLODipine (NORVASC) 5 MG tablet Take 1 tablet (5 mg total) by mouth daily.  [name] b complex vitamins tablet Take 1 tablet by mouth every morning.   . Ca Phosphate-Cholecalciferol (CALCIUM/VITAMIN D3 GUMMIES) 250-350 MG-UNIT CHEW Chew 1 Dose by mouth 2 (two) times daily.  . carbidopa-levodopa (SINEMET IR) 25-100 MG tablet Take 1 tablet by mouth 4 (four) times daily.  . ciprofloxacin (CIPRO) 500 MG tablet Take 500 mg by mouth 2 (two) times daily.  . fish oil-omega-3 fatty acids 1000 MG capsule Take 2 g by mouth every morning.   . memantine (NAMENDA XR) 28 MG CP24 24 hr capsule Take 1 capsule (28 mg total) by mouth every morning.  . Multiple Vitamins-Minerals (MULTIVITAMIN WITH MINERALS) tablet Take 1 tablet by mouth every morning.   . polyethylene glycol (MIRALAX / GLYCOLAX) packet Take 17  g by mouth daily as needed (constipation).  . potassium chloride SA (KLOR-CON M20) 20 MEQ tablet Take 2 tablets (40 mEq total) by mouth daily.  . QUEtiapine (SEROQUEL) 25 MG tablet Take 1 tablet (25 mg total) by mouth at bedtime. May use half a tablet 12.5 mg during the daytime as needed for agitation/hallucinations (Patient taking differently: Take 12.5 mg by mouth as needed (sleep). May use half a tablet 12.5 mg during the daytime as needed for agitation/hallucinations)  . vitamin C (ASCORBIC ACID) 500 MG tablet Take 1,000 mg by mouth every morning.   . warfarin (COUMADIN) 5 MG tablet Take 0.5-1 tablets (2.5-5 mg total) by mouth See admin  instructions. Take 1 tablet on Mon, and Fri. Take 1/2 tablet all other days (Patient taking differently: Take 2.5-5 mg by mouth See admin instructions. 5mg  Mon, Wed - 2.5mg  all other days)  . [DISCONTINUED] Morphine Sulfate (MORPHINE CONCENTRATE) 10 mg / 0.5 ml concentrated solution Take 5 mg by mouth every 2 (two) hours as needed.   Allergies: Allergies as of 11/12/2017 - Review Complete 11/12/2017  Allergen Reaction Noted  . Aricept [donepezil hcl] Other (See Comments) 07/24/2013  . Other Other (See Comments) 08/17/2011  . Shellfish-derived products Other (See Comments) 01/21/2016   Past Medical History:  Diagnosis Date  . Anxiety   . Coronary artery disease, non-occlusive    with history of MI; Cath 2008 w multivessel nonobstructive CAD  . Dementia   . Depression   . GERD (gastroesophageal reflux disease)   . Hyperlipidemia   . Hypertension   . Memory loss   . Parkinson disease ([place])   . PE (pulmonary embolism)    unprovoked PE completed 6 months of warfarin, warfarin d/ced 02/12/2010, repeat PE 07/10/2010 after a long car ride and now on lifelong coumadin.  . Pituitary microadenoma ([place])    incidental finding CT 12/2009  . Prostate cancer ([place])   . Scoliosis   . Sigmoid volvulus ([place]) 08/02/2016  . Volvulus of colon ([place]) 06/01/2017   Family History:  Mother: + Dementia  Father: + HTN  Social History:  Lives with daughter who is DPOA Denies use of EtOH, Tobacco, illicit substances  Review of Systems: A complete ROS was negative except as per HPI.   Physical Exam: Blood pressure (!) 159/93, pulse 77, temperature (!) 97.4 F (36.3 C), temperature source Oral, resp. rate 11, height 5\' 8"  (1.727 m), weight 136 lb (61.7 kg), SpO2 98 %.  General: Thin, frail elderly male in no acute distress HENT: Normocephalic, atraumatic, dry mucus membranes  Pulm: Good air movement with no wheezing or crackles  CV: RRR, no murmurs, no rubs  Abdomen: Active bowel sounds, soft,  non-distended, no tenderness to palpation  Extremities: Pulses palpable in all extremities, no LE edema  Skin: Warm and dry Neuro: Alert but not oriented. Neuro exam limited by patient examination but no apparent focal weaknesses.   CXR: personally reviewed: my interpretation is elevated left hemidiaphragm due to large bowel gas  Assessment & Plan by Problem: Active Problems:   Altered mental status  [name] [name] is a 84 y.o male with advance Parkinson's disease and dementia recently enrolled in palliative who presented to the ED with AMS. He was initially started on Ciprofloxacin for UTI on 2/21 but has continued to decline. In the ED the patient was afebrile and hemodynamically stable. Initial labs were remarkable for hypernatremia. UA and CXR were unremarkable for infectious etiology. CT head did not reveal any structural causes including  CVA or subdural hematoma (on warfarin for recurrent PE).  At this point infectious, structural, and toxin/medication causes of the patient AMS are less likely than a metabolic cause given the patient's hypernatremia.   Altered Mental Status / Hypernatremia  Patient's AMS is likely of metabolic etiology (hypernatremia). Patient's daughter and recent clinic notes mention that the patient has started to have decreased PO intake which would explain the hypernatremia. At time of presentation his calculated free water deficit is 3.1L will we target a correction of 0.5 mEq/hr with 77mL/hr D5W. - Replete free water deficit with 24mL/hr D5W, correction goal of 0.5 mEq/hr - BMP now and in the AM  Possible Urinary Tract Infection  UA on 2/22 obtained prior to initiation of Ciprofloxacin. No indication of UTI. Will stop ciprofloxacin.   Advanced Parkinson's with Dementia and Hallucinations  Patient recently enrolled in hospice and has significantly declined recently. Will continue home medications at this point.  - Continue Carbidopa-levodopa and memantine -  Delirium precautions  - Can use Quetiapine 12.5 mg for hallucinations (this is how family treats at home)  Hypertension  - Continue amlodipine 5 mg QD  Recurrent PE - Continue Warfarin   Diet: Regular diet  VTE ppx: Patient on Warfarin for recurrent PE Code Status: Full Code  Dispo: Admit patient to Inpatient with expected length of stay greater than 2 midnights.  Signed: Ina Homes, MD 11/12/2017, 9:20 PM  My Pager: [phone]

## 2017-11-13 DIAGNOSIS — Z91013 Allergy to seafood: Secondary | ICD-10-CM

## 2017-11-13 DIAGNOSIS — Z7901 Long term (current) use of anticoagulants: Secondary | ICD-10-CM

## 2017-11-13 DIAGNOSIS — Z86711 Personal history of pulmonary embolism: Secondary | ICD-10-CM

## 2017-11-13 DIAGNOSIS — Z810 Family history of intellectual disabilities: Secondary | ICD-10-CM | POA: Diagnosis not present

## 2017-11-13 DIAGNOSIS — R4182 Altered mental status, unspecified: Secondary | ICD-10-CM

## 2017-11-13 DIAGNOSIS — Z79899 Other long term (current) drug therapy: Secondary | ICD-10-CM

## 2017-11-13 DIAGNOSIS — I10 Essential (primary) hypertension: Secondary | ICD-10-CM

## 2017-11-13 DIAGNOSIS — F0281 Dementia in other diseases classified elsewhere with behavioral disturbance: Secondary | ICD-10-CM | POA: Diagnosis not present

## 2017-11-13 DIAGNOSIS — E870 Hyperosmolality and hypernatremia: Secondary | ICD-10-CM | POA: Diagnosis not present

## 2017-11-13 DIAGNOSIS — Z888 Allergy status to other drugs, medicaments and biological substances status: Secondary | ICD-10-CM

## 2017-11-13 DIAGNOSIS — G20 Parkinson's disease: Secondary | ICD-10-CM | POA: Diagnosis not present

## 2017-11-13 LAB — CBC
HCT: 40.7 % (ref 39.0–52.0)
Hemoglobin: 12.5 g/dL — ABNORMAL LOW (ref 13.0–17.0)
MCH: 30.1 pg (ref 26.0–34.0)
MCHC: 30.7 g/dL (ref 30.0–36.0)
MCV: 98.1 fL (ref 78.0–100.0)
Platelets: 162 10*3/uL (ref 150–400)
RBC: 4.15 MIL/uL — ABNORMAL LOW (ref 4.22–5.81)
RDW: 15.9 % — ABNORMAL HIGH (ref 11.5–15.5)
WBC: 3.2 10*3/uL — ABNORMAL LOW (ref 4.0–10.5)

## 2017-11-13 LAB — BASIC METABOLIC PANEL
Anion gap: 10 (ref 5–15)
BUN: 8 mg/dL (ref 6–20)
CO2: 23 mmol/L (ref 22–32)
Calcium: 8.6 mg/dL — ABNORMAL LOW (ref 8.9–10.3)
Chloride: 112 mmol/L — ABNORMAL HIGH (ref 101–111)
Creatinine, Ser: 0.77 mg/dL (ref 0.61–1.24)
GFR calc Af Amer: >60 mL/min (ref >60)
GFR calc non Af Amer: >60 mL/min (ref >60)
Glucose, Bld: 88 mg/dL (ref 65–99)
Potassium: 3.5 mmol/L (ref 3.5–5.1)
Sodium: 145 mmol/L (ref 135–145)

## 2017-11-13 LAB — PROTIME-INR
INR: 3.08
Prothrombin Time: 31.5 seconds — ABNORMAL HIGH (ref 11.4–15.2)

## 2017-11-13 MED ORDER — WARFARIN 1.25 MG HALF TABLET
1.2500 mg | ORAL_TABLET | Freq: Once | ORAL | Status: DC
  Filled 2017-11-13: qty 1

## 2017-11-13 MED ORDER — POTASSIUM CHLORIDE CRYS ER 20 MEQ PO TBCR
40.0000 meq | EXTENDED_RELEASE_TABLET | Freq: Once | ORAL | Status: AC
  Administered 2017-11-13: 40 meq via ORAL
  Filled 2017-11-13: qty 2

## 2017-11-13 NOTE — Progress Notes (Signed)
  Date: 11/13/2017  Patient name: [name] [name]  Medical record number: [id number]  Date of birth: 10-Jan-1931   I saw and evaluated the patient. I reviewed the resident's note and I agree with the resident's findings and plan as documented in the resident's note.  Chief Complaint(s): Altered mental status  History - key components related to admission:  Please see resident note for details.  Briefly, [name] [name] is an 84 yo M with a h/o parkinson's disease and dementia who presented with worsening altered mental status. He was unable to provide a history to me today, so most of my history was obtained from chart review and discussion with other members of our team spoke with his daughter. He has had declining oral intake, reportedly not eating or drinking anything unless reminded by his daughter. He was evaluated at our clinic a few days ago, with concern for UTI, but urinalysis and cultures were unable to be obtained. He was later started on ciprofloxacin, apparently empirically. They brought him in today for a dramatic decline in function. He has not had any fevers, nausea, vomiting, diarrhea, pain, cough, or other signs of illness.  Physical Exam - key components related to admission:  Vitals:   11/12/17 2031 11/12/17 2125 11/13/17 0432 11/13/17 1314  BP: (!) 159/93 (!) 146/97 (!) 154/99 (!) 145/96  Pulse:  66 62 72  Resp: 11 20 16 16   Temp:  98.3 F (36.8 C) 98.1 F (36.7 C) (!) 97.3 F (36.3 C)  TempSrc:  Oral Oral Oral  SpO2: 98% 95% 97% 99%  Weight:      Height:        General: Alert, pleasant, not in distress  HEENT: Moist mucous membranes Neck: No JVD, no lymphadenopathy Cardiovascular: Regular rate and rhythm, no murmurs rubs or gallops Pulmonary: Clear to auscultation bilaterally, normal work of breathing Abdominal: Soft, nontender, not distended, normal bowel sounds Extremities: Warm, no edema Skin: No significant rashes or lesions Neuro: Alert but only oriented  to person, speech is not slurred but thinking is confused and answers to questions are somewhat inappropriate, no focal weakness or other deficits  Significant test results: Sodium 154, down to 145 today urinalysis negative Urinalysis negative (both on presentation and from home prior to starting Cipro) Leukopenic with WBC 3.2  Assessment and Plan: I have seen and evaluated the patient as outlined in the resident's note. I agree with the formulated Assessment and Plan as detailed in the resident's note, with the following changes:   Principal Problem:   Altered mental status Active Problems:   Alzheimer's dementia   Parkinsonian syndrome ([place])   1. Altered mental status: No recent change in medications, and no apparent signs of infection. Most likely due to hypernatremia from poor oral intake. This likely is reflective of his advancing dementia, and he may be nearing end-stage dementia. There is little utility in prolonged hospitalization for him, so we will try to get him back, soon as possible. It will be imperative to continue to readdress goals of care with his family as he is likely to continue to progress if he is unable to eat or drink.  [name] Kilts, MD 2/24/20195:20 PM

## 2017-11-13 NOTE — Discharge Instructions (Signed)

## 2017-11-13 NOTE — Discharge Summary (Signed)
Name: [name] [name] MRN: [id number] DOB: 06-18-1931 84 y.o. PCP: [name], DO  Date of Admission: 11/12/2017  3:56 PM Date of Discharge: 11/13/2017 Attending Physician: Oda Kilts, MD  Discharge Diagnosis: 1. Altered mental status 2. Hypernatremia 3. Dehydration  Active Problems:   Altered mental status   Discharge Medications: Allergies as of 11/13/2017      Reactions   Aricept [donepezil Hcl] Other (See Comments)   Symptomatic Bradycardia.   Other Other (See Comments)   Shrimp gives him gout   Shellfish-derived Products Other (See Comments)   Causes a flare up with Gout      Medication List    TAKE these medications   acetaminophen 500 MG tablet Commonly known as:  TYLENOL Take 1,000 mg by mouth 2 (two) times daily.   amLODipine 5 MG tablet Commonly known as:  NORVASC Take 1 tablet (5 mg total) by mouth daily.   b complex vitamins tablet Take 1 tablet by mouth every morning.   Ca Phosphate-Cholecalciferol 250-350 MG-UNIT Chew Commonly known as:  CALCIUM/VITAMIN D3 GUMMIES Chew 1 Dose by mouth 2 (two) times daily.   carbidopa-levodopa 25-100 MG tablet Commonly known as:  SINEMET IR Take 1 tablet by mouth 4 (four) times daily.   fish oil-omega-3 fatty acids 1000 MG capsule Take 2 g by mouth every morning.   memantine 28 MG Cp24 24 hr capsule Commonly known as:  NAMENDA XR Take 1 capsule (28 mg total) by mouth every morning.   multivitamin with minerals tablet Take 1 tablet by mouth every morning.   polyethylene glycol packet Commonly known as:  MIRALAX / GLYCOLAX Take 17 g by mouth daily as needed (constipation).   potassium chloride SA 20 MEQ tablet Commonly known as:  KLOR-CON M20 Take 2 tablets (40 mEq total) by mouth daily.   QUEtiapine 25 MG tablet Commonly known as:  SEROQUEL Take 1 tablet (25 mg total) by mouth at bedtime. May use half a tablet 12.5 mg during the daytime as needed for agitation/hallucinations What changed:     how much to take  when to take this  reasons to take this  additional instructions   senna 8.6 MG Tabs tablet Commonly known as:  SENOKOT Take 1 tablet (8.6 mg total) by mouth daily as needed for mild constipation.   vitamin C 500 MG tablet Commonly known as:  ASCORBIC ACID Take 1,000 mg by mouth every morning.   warfarin 5 MG tablet Commonly known as:  COUMADIN Take as directed. If you are unsure how to take this medication, talk to your nurse or doctor. Original instructions:  Take 0.5-1 tablets (2.5-5 mg total) by mouth See admin instructions. Take 1 tablet on Mon, and Fri. Take 1/2 tablet all other days What changed:  additional instructions       Disposition and follow-up:   [name] was discharged from [facility] in Stable condition.  At the hospital follow up visit please address:  1.  Assess mental status. Please make sure he is maintaining PO hydration.  2.  Please re-check BMET for Na  3.  Labs / imaging needed at time of follow-up: BMET  4.  Pending labs/ test needing follow-up: None  Follow-up Appointments: Follow-up Information    [name], DO Follow up.   Specialty:  Internal Medicine Why:  Please call on Monday 2/25 to make an appointment for a follow-up visit in 1-2 weeks. Contact information: [place]  [place] Hospital Course by problem list: Active Problems:   Altered mental status   1. Altered mental status, hypernatremia, dehydration Patient admitted on 2/23 with altered mental status in the setting of reduced PO intake. He was seen in clinic on 2/21 and started empirically on Ciprofloxacin at that time, however his mental status continued to decline. In the ER, he was found to have an elevated sodium to 154. UA negative for UTI and CXR did not show infectious etiology. CT head without structural causes, including CVA or subdural hematoma. AMS was felt to be  due to hypernatremia 2/2 reduced PO intake. Ciprofloxacin was discontinued due to no s/s of UTI. He was started on D5W with improvement in his sodium to 145 on discharge and improvement in his mental status. He was able to feed himself breakfast prior to discharge and daughter stated that he was back to baseline. He was discharged with plan for close follow up in IMTS clinic for repeat labwork.  Discharge Vitals:   BP (!) 154/99 (BP Location: Right Arm)   Pulse 62   Temp 98.1 F (36.7 C) (Oral)   Resp 16   Ht 5\' 8"  (1.727 m)   Wt 136 lb (61.7 kg)   SpO2 97%   BMI 20.68 kg/m   Pertinent Labs, Studies, and Procedures:  CBC Latest Ref Rng & Units 11/13/2017 11/12/2017 11/12/2017  WBC 4.0 - 10.5 K/uL 3.2(L) - 3.5(L)  Hemoglobin 13.0 - 17.0 g/dL 12.5(L) 13.6 13.3  Hematocrit 39.0 - 52.0 % 40.7 40.0 42.6  Platelets 150 - 400 K/uL 162 - 267   CMP Latest Ref Rng & Units 11/13/2017 11/12/2017 11/12/2017  Glucose 65 - 99 mg/dL 88 99 83  BUN 6 - 20 mg/dL 8 9 12   Creatinine 0.61 - 1.24 mg/dL 0.77 0.73 0.93  Sodium 135 - 145 mmol/L 145 147(H) 152(H)  Potassium 3.5 - 5.1 mmol/L 3.5 3.3(L) 4.1  Chloride 101 - 111 mmol/L 112(H) 116(H) 118(H)  CO2 22 - 32 mmol/L 23 23 24   Calcium 8.9 - 10.3 mg/dL 8.6(L) 8.3(L) 8.7(L)  Total Protein 6.5 - 8.1 g/dL - - 6.7  Total Bilirubin 0.3 - 1.2 mg/dL - - 0.8  Alkaline Phos 38 - 126 U/L - - 36(L)  AST 15 - 41 U/L - - 41  ALT 17 - 63 U/L - - 8(L)   Lactic acid 1.45 VBG 7.375/46.4/38.5 UA with 5 ketones, no UTI INR 3.34 -> 3.08 Ethyl alcohol <10 Ammonia 28 Troponin negative  CXR 11/12/2017 1. Lung base opacity, left greater than right, most consistent with atelectasis, accentuated by low lung volumes and relative elevation of left hemidiaphragm. 2. No convincing pneumonia.  No evidence of pulmonary edema.  CT head 11/12/2017 1. No acute intracranial abnormalities. 2. Atrophy and advanced chronic microvascular ischemic change, stable from the prior CT. 3.  Enlarged pituitary gland likely a macro adenoma, similar to the prior CT.  Discharge Instructions: Discharge Instructions    Diet - low sodium heart healthy   Complete by:  As directed    Discharge instructions   Complete by:  As directed    [name],  It was a pleasure to take care of you in the hospital. You were in the hospital for confusion that was due to your sodium being too high. This was caused by not eating and drinking enough which led to dehydration. Your sodium levels and confusion got better after we gave  you some IV fluids. When you leave the hospital, we encourage you to eat and drink enough so that you stay hydrated.   Please call our clinic [phone] on Monday 2/25 to schedule a follow up appointment for 1-2 weeks. At that visit, we can follow up with how things are going at home to make sure that you are still doing okay.  If you have any questions or concerns, call our clinic at [phone] or after hours call [phone] and ask for the internal medicine resident on call.   Increase activity slowly   Complete by:  As directed      Signed: Colbert Ewing, MD 11/13/2017, 12:37 PM   Pager: Mamie Nick [phone]

## 2017-11-13 NOTE — Care Management CC44 (Signed)
Condition Code 44 Documentation Completed  Patient Details  Name: [name] [name] MRN: [id number] Date of Birth: [birth year]   Condition Code 44 given:  Yes Patient signature on Condition Code 44 notice:  Yes Documentation of 2 MD's agreement:  Yes Code 44 added to claim:  Yes   Reviewed with patient's daughter at bedside.  Carles Collet, RN 11/13/2017, 12:33 PM

## 2017-11-13 NOTE — Care Management Obs Status (Signed)
[place] NOTIFICATION   Patient Details  Name: [name] [name] MRN: [id number] Date of Birth: 1930-10-06   Medicare Observation Status Notification Given:  Yes   Reviewed with patient's daughter at bedside. Carles Collet, RN 11/13/2017, 12:33 PM

## 2017-11-13 NOTE — Progress Notes (Addendum)
   Subjective:  No acute events overnight. Patient reports pain in his back, but no chest pain or shortness of breath. Patient was able to sit up and feed himself breakfast this morning. Daughter not currently at bedside.  Returned to bedside and talked to daughter. She states that he is back to baseline and is eager to get him home so that he does not develop delirium. She was concerned about his BP - 150s/90s. I discussed that we worry more about low BP in elderly patients and that his current BP is not emergently dangerous.  Objective:  Vital signs in last 24 hours: Vitals:   11/12/17 1923 11/12/17 2031 11/12/17 2125 11/13/17 0432  BP: (!) 169/94 (!) 159/93 (!) 146/97 (!) 154/99  Pulse: 77  66 62  Resp: 12 11 20 16   Temp: (!) 97.4 F (36.3 C)  98.3 F (36.8 C) 98.1 F (36.7 C)  TempSrc: Oral  Oral Oral  SpO2: 99% 98% 95% 97%  Weight:      Height:       GEN: Thin and frail. Elderly male. Alert. Only oriented to self. No acute distress. Difficult to understand. EYES: PERRL. Sclera non-icteric. Conjunctiva clear. RESP: Clear to auscultation bilaterally. No wheezes, rales, or rhonchi. No increased work of breathing. CV: Normal rate and regular rhythm. No murmurs, gallops, or rubs. No LE edema. ABD: Soft. Non-tender. Non-distended. Normoactive bowel sounds. EXT: No edema. Warm and well perfused. NEURO: Cranial nerves II-XII grossly intact. Able to lift all four extremities against gravity. No apparent audiovisual hallucinations. Difficult to understand patient. PSYCH: Patient is calm. Well-groomed; speech is appropriate and on-subject.  Assessment/Plan:  Active Problems:   Altered mental status  Mr. Cazarez is an 84 y.o male with PMH of advanced Parkinson's disease and dementia recently enrolled in palliative who presented with AMS. Started on Ciprofloxacin for possible UTI on 2/21 but has continued to decline. Initial labs were remarkable for hypernatremia. No s/s of infectious  etiology. CT head without acute pathology. AMS thought to be secondary to hypernatremia due to reduced PO intake.  Altered Mental Status, improved Hypernatremia, improved AMS likely of metabolic etiology (hypernatremia). No evidence of UTI on UA on 2/22, cipro has been discontinued. Mental status has improved today after correction of hypernatremia. Patient was seen feeding himself breakfast this morning. Na 145 this AM after D5W administration. Daughter reports patient back at baseline. - Will touch base with daughter to ensure patient is back to baseline - Discussed with daughter and amenable to discharge today - F/u UCx - F/u with PCP in 1-2 weeks  Advanced Parkinson's with Dementia and Hallucinations  Patient recently enrolled in palliative and has significantly declined recently. Will continue home medications - Continue Carbidopa-levodopa and memantine - Delirium precautions - Can use Quetiapine 12.5 mg for hallucinations (this is how family treats at home)  Hypertension  BP 154/99 - Continue amlodipine 5 mg qday  Recurrent PE - Continue home Warfarin   Dispo: Anticipated discharge today.  Colbert Ewing, MD 11/13/2017, 11:03 AM Pager: Mamie Nick [phone]

## 2017-11-13 NOTE — Progress Notes (Signed)
ANTICOAGULATION CONSULT NOTE  Pharmacy Consult:  Coumadin Indication: history of PE  Allergies  Allergen Reactions  . Aricept [Donepezil Hcl] Other (See Comments)    Symptomatic Bradycardia.  . Other Other (See Comments)    Shrimp gives him gout  . Shellfish-Derived Products Other (See Comments)    Causes a flare up with Gout    Patient Measurements: Height: 5\' 8"  (172.7 cm) Weight: 136 lb (61.7 kg) IBW/kg (Calculated) : 68.4  Vital Signs: Temp: 98.1 F (36.7 C) (02/24 0432) Temp Source: Oral (02/24 0432) BP: 154/99 (02/24 0432) Pulse Rate: 62 (02/24 0432)  Labs: Recent Labs    11/12/17 1656  11/12/17 1706 11/12/17 1753 11/12/17 2302 11/13/17 0457  HGB 13.3  --  13.6  --   --  12.5*  HCT 42.6  --  40.0  --   --  40.7  PLT 267  --   --   --   --  162  LABPROT 33.6*  --   --   --   --  31.5*  INR 3.34  --   --   --   --  3.08  CREATININE  --    < > 1.00 0.93 0.73 0.77   < > = values in this interval not displayed.    Estimated Creatinine Clearance: 60 mL/min (by C-G formula based on SCr of 0.77 mg/dL).    Assessment: 71 YOM on Coumadin PTA for a history of PE. He has been on ciprofloxacin lately for a UTI; therefore he has had some changes in his Coumadin dose PTA.  INR trending down but remains slightly supra-therapeutic.  No bleeding reported.  Home Coumadin dose: 2.5mg  PO daily except 5mg  on Mon and Wed   Goal of Therapy:  INR 2 - 3     Plan:  Repeat Coumadin 1.25mg  PO today Daily PT / INR Watch CBC   Sanvika Cuttino D. Mina Marble, PharmD, BCPS Pager:  [phone] 11/13/2017, 9:41 AM

## 2017-11-14 ENCOUNTER — Telehealth: Payer: Self-pay | Admitting: Pharmacist

## 2017-11-14 ENCOUNTER — Telehealth: Payer: Self-pay

## 2017-11-14 ENCOUNTER — Ambulatory Visit: Payer: Medicare HMO

## 2017-11-14 DIAGNOSIS — R269 Unspecified abnormalities of gait and mobility: Secondary | ICD-10-CM | POA: Diagnosis not present

## 2017-11-14 DIAGNOSIS — G20 Parkinson's disease: Secondary | ICD-10-CM | POA: Diagnosis not present

## 2017-11-14 DIAGNOSIS — R001 Bradycardia, unspecified: Secondary | ICD-10-CM | POA: Diagnosis not present

## 2017-11-14 DIAGNOSIS — F0280 Dementia in other diseases classified elsewhere without behavioral disturbance: Secondary | ICD-10-CM | POA: Diagnosis not present

## 2017-11-14 DIAGNOSIS — R531 Weakness: Secondary | ICD-10-CM | POA: Diagnosis not present

## 2017-11-14 DIAGNOSIS — I110 Hypertensive heart disease with heart failure: Secondary | ICD-10-CM | POA: Diagnosis not present

## 2017-11-14 LAB — URINE CULTURE

## 2017-11-14 NOTE — Telephone Encounter (Signed)
I will call daughter and provide an empiric management strategy for the known drug-drug interaction with an oral fluroquinolone + warfarin. Thank you Dr. Juleen China.

## 2017-11-14 NOTE — Telephone Encounter (Signed)
Was messaged by Dr. Juleen China who was contacted by the patient's daughter citing a concern for DDI of warfarin with ciprofloxcin that is to be commenced today. Will OMIT today's warfarin dose; recommence tomorrow with only 1/2 tablet  x 5mg  warfarin (2.5mg ) PO qd for 3 days--INR will be collected on Friday 1-MAR-19 by [facility] RN performing FS POC INR determination, results to be called or texted to me at [phone].

## 2017-11-14 NOTE — Telephone Encounter (Signed)
Called daughter, [name]. No answer. Will continue to attempt to call her at number on file in [facility].

## 2017-11-14 NOTE — Telephone Encounter (Signed)
Left vm for patients daughter that sinemet casue postural hypotension as a side effects. The medication does not cause hypertension. Dr. Leonie Man wants PCP to discuss further directions about her fathers hypertension.

## 2017-11-14 NOTE — Telephone Encounter (Signed)
Reached Daughter, Lilli Light on her number provided at 3:41PM Monday 25-FEB-19. She indicates that the cipro was provided last week and that an empiric dose reduction had been ordered. She was to have come today--but was advised she states by Triage RN today--to not come to [facility] today--but instead, this coming Thursday 28-MAR-19. She states she will adhere to this plan as provided by the Triage Nurse.

## 2017-11-14 NOTE — Telephone Encounter (Signed)
HFU TOC per dr Ronalee Red, discharge 11/13/2017.

## 2017-11-15 DIAGNOSIS — G20 Parkinson's disease: Secondary | ICD-10-CM | POA: Diagnosis not present

## 2017-11-15 DIAGNOSIS — R269 Unspecified abnormalities of gait and mobility: Secondary | ICD-10-CM | POA: Diagnosis not present

## 2017-11-15 DIAGNOSIS — R001 Bradycardia, unspecified: Secondary | ICD-10-CM | POA: Diagnosis not present

## 2017-11-15 DIAGNOSIS — F0280 Dementia in other diseases classified elsewhere without behavioral disturbance: Secondary | ICD-10-CM | POA: Diagnosis not present

## 2017-11-15 DIAGNOSIS — I110 Hypertensive heart disease with heart failure: Secondary | ICD-10-CM | POA: Diagnosis not present

## 2017-11-15 DIAGNOSIS — R531 Weakness: Secondary | ICD-10-CM | POA: Diagnosis not present

## 2017-11-16 DIAGNOSIS — R269 Unspecified abnormalities of gait and mobility: Secondary | ICD-10-CM | POA: Diagnosis not present

## 2017-11-16 DIAGNOSIS — R531 Weakness: Secondary | ICD-10-CM | POA: Diagnosis not present

## 2017-11-16 DIAGNOSIS — F0280 Dementia in other diseases classified elsewhere without behavioral disturbance: Secondary | ICD-10-CM | POA: Diagnosis not present

## 2017-11-16 DIAGNOSIS — G20 Parkinson's disease: Secondary | ICD-10-CM | POA: Diagnosis not present

## 2017-11-16 DIAGNOSIS — I110 Hypertensive heart disease with heart failure: Secondary | ICD-10-CM | POA: Diagnosis not present

## 2017-11-16 DIAGNOSIS — R001 Bradycardia, unspecified: Secondary | ICD-10-CM | POA: Diagnosis not present

## 2017-11-17 ENCOUNTER — Other Ambulatory Visit: Payer: Self-pay

## 2017-11-17 ENCOUNTER — Ambulatory Visit (INDEPENDENT_AMBULATORY_CARE_PROVIDER_SITE_OTHER): Payer: Medicare HMO | Admitting: Internal Medicine

## 2017-11-17 VITALS — BP 130/90 | HR 63 | Temp 98.1°F | Ht 68.0 in

## 2017-11-17 DIAGNOSIS — I110 Hypertensive heart disease with heart failure: Secondary | ICD-10-CM | POA: Diagnosis not present

## 2017-11-17 DIAGNOSIS — G20 Parkinson's disease: Secondary | ICD-10-CM

## 2017-11-17 DIAGNOSIS — R269 Unspecified abnormalities of gait and mobility: Secondary | ICD-10-CM | POA: Diagnosis not present

## 2017-11-17 DIAGNOSIS — Z5189 Encounter for other specified aftercare: Secondary | ICD-10-CM | POA: Diagnosis not present

## 2017-11-17 DIAGNOSIS — G309 Alzheimer's disease, unspecified: Secondary | ICD-10-CM | POA: Diagnosis not present

## 2017-11-17 DIAGNOSIS — E870 Hyperosmolality and hypernatremia: Secondary | ICD-10-CM | POA: Diagnosis not present

## 2017-11-17 DIAGNOSIS — R531 Weakness: Secondary | ICD-10-CM | POA: Diagnosis not present

## 2017-11-17 DIAGNOSIS — R404 Transient alteration of awareness: Secondary | ICD-10-CM | POA: Diagnosis not present

## 2017-11-17 DIAGNOSIS — E860 Dehydration: Secondary | ICD-10-CM

## 2017-11-17 DIAGNOSIS — F0280 Dementia in other diseases classified elsewhere without behavioral disturbance: Secondary | ICD-10-CM

## 2017-11-17 DIAGNOSIS — I10 Essential (primary) hypertension: Secondary | ICD-10-CM

## 2017-11-17 DIAGNOSIS — Z79899 Other long term (current) drug therapy: Secondary | ICD-10-CM

## 2017-11-17 DIAGNOSIS — R001 Bradycardia, unspecified: Secondary | ICD-10-CM | POA: Diagnosis not present

## 2017-11-17 MED ORDER — AMLODIPINE BESYLATE 2.5 MG PO TABS: 2.5000 mg | ORAL_TABLET | Freq: Every day | ORAL | 1 refills | Status: DC

## 2017-11-17 NOTE — Progress Notes (Signed)
CC: Follow up after hospitalization for altered mental status secondary to hyponatremia and dehydration   HPI:  Mr.Fern Sheehan is a 84 y.o. with PMH as listed below who presents for Follow up after hospitalization for altered mental status secondary to hyponatremia and dehydration . Please see the assessment and plans for the status of the patient chronic medical problems.   Past Medical History:  Diagnosis Date  . Anxiety   . Coronary artery disease, non-occlusive    with history of MI; Cath 2008 w multivessel nonobstructive CAD  . Dementia   . Depression   . GERD (gastroesophageal reflux disease)   . Hyperlipidemia   . Hypertension   . Memory loss   . Parkinson disease ([place])   . PE (pulmonary embolism)    unprovoked PE completed 6 months of warfarin, warfarin d/ced 02/12/2010, repeat PE 07/10/2010 after a long car ride and now on lifelong coumadin.  . Pituitary microadenoma ([place])    incidental finding CT 12/2009  . Prostate cancer ([place])   . Scoliosis   . Sigmoid volvulus ([place]) 08/02/2016  . Volvulus of colon ([place]) 06/01/2017   Review of Systems: Refer to history of present illness and assessment and plans for pertinent review of systems, all others reviewed and negative   Physical Exam:  Vitals:   11/17/17 1052  BP: 130/90  Pulse: 63  Temp: 98.1 F (36.7 C)  TempSrc: Oral  SpO2: 99%  Height: 5\' 8"  (1.727 m)   General: tired appearing, no acute distress  Cardiac: regular rate and rhythm, no murmur, no peripheral edema  Pulm: no respiratory distress, lungs are clear to auscultation, no wheezes or rhonchi  Neuro: he is sleeping comfortably through my conversation with his daughter says " I feel alright" and that he is not in pain, not having much conversation besides that. He is not able to change position in his wheelchair on his own when asked to sit forward. His body is rigid with lifting.    Assessment & Plan:   Hypernatremia  Mr. Fell was recently  hospitalized for altered mental status from hypernatremia secondary to decreased po intake as a complication of advanced alzheimer's. His sodium was 154 at the time of admission and has improved to 145 at the time of discharge and he was found to be awake and at baseline and feeding himself. Daughter says that Mr. Piscitello has remained at baseline since the time of hospital discharge. She feels that he has not really been drinking more than he was before. We spoke about how advanced his Parkinson's is and that this is likely a sign of the disease progressing.  - BMP today   Hypertension  At last continuity clinic visit blood pressure was 167/113. Mr. Foister PCP spoke with his daughter about how fluctuations in vital signs are expected with parkinsons and levels of hydration and advised that they not make changes to his antihypertensive regiment amlodipine 2.5 mg daily. His daughter called cardiology for a second opinion and the amlodipine was increased to 5 mg daily. Today she brings a log of his recent blood pressures. On 2/25 the BP was 96/62 and on the following day 125/91. She realizes that the amlodipine dose may be too high now and the elevation was likely due to severe dehydration so she has gone back to amlodipine 2.5 mg daily and even holds off on giving the medication on mornings when the BP in 120/80 now.  - continue amlodipine 2.5 mg daily   See  Encounters Tab for problem based charting.  Patient discussed with Dr. Beryle Beams

## 2017-11-17 NOTE — Assessment & Plan Note (Signed)
At last continuity clinic visit blood pressure was 167/113. Mr. Nicoson PCP spoke with his daughter about how fluctuations in vital signs are expected with parkinsons and levels of hydration and advised that they not make changes to his antihypertensive regiment amlodipine 2.5 mg daily. His daughter called cardiology for a second opinion and the amlodipine was increased to 5 mg daily. Today she brings a log of his recent blood pressures. On 2/25 the BP was 96/62 and on the following day 125/91. She realizes that the amlodipine dose may be too high now and the elevation was likely due to severe dehydration so she has gone back to amlodipine 2.5 mg daily and even holds off on giving the medication on mornings when the BP in 120/80 now.  - continue amlodipine 2.5 mg daily

## 2017-11-17 NOTE — Assessment & Plan Note (Signed)
[name] [name] was recently hospitalized for altered mental status from hypernatremia secondary to decreased po intake as a complication of advanced alzheimer's. His sodium was 154 at the time of admission and has improved to 145 at the time of discharge and he was found to be awake and at baseline and feeding himself. Daughter says that [name] has remained at baseline since the time of hospital discharge. She feels that he has not really been drinking more than he was before. We spoke about how advanced his Parkinson's is and that this is likely a sign of the disease progressing.  - BMP today

## 2017-11-18 ENCOUNTER — Telehealth: Payer: Self-pay | Admitting: Internal Medicine

## 2017-11-18 ENCOUNTER — Telehealth: Payer: Self-pay | Admitting: *Deleted

## 2017-11-18 DIAGNOSIS — F0280 Dementia in other diseases classified elsewhere without behavioral disturbance: Secondary | ICD-10-CM | POA: Diagnosis not present

## 2017-11-18 DIAGNOSIS — R32 Unspecified urinary incontinence: Secondary | ICD-10-CM | POA: Diagnosis not present

## 2017-11-18 DIAGNOSIS — M1A00X0 Idiopathic chronic gout, unspecified site, without tophus (tophi): Secondary | ICD-10-CM | POA: Diagnosis not present

## 2017-11-18 DIAGNOSIS — R159 Full incontinence of feces: Secondary | ICD-10-CM | POA: Diagnosis not present

## 2017-11-18 DIAGNOSIS — Z515 Encounter for palliative care: Secondary | ICD-10-CM | POA: Diagnosis not present

## 2017-11-18 DIAGNOSIS — R531 Weakness: Secondary | ICD-10-CM | POA: Diagnosis not present

## 2017-11-18 DIAGNOSIS — R269 Unspecified abnormalities of gait and mobility: Secondary | ICD-10-CM | POA: Diagnosis not present

## 2017-11-18 DIAGNOSIS — G20 Parkinson's disease: Secondary | ICD-10-CM | POA: Diagnosis not present

## 2017-11-18 DIAGNOSIS — R001 Bradycardia, unspecified: Secondary | ICD-10-CM | POA: Diagnosis not present

## 2017-11-18 DIAGNOSIS — I110 Hypertensive heart disease with heart failure: Secondary | ICD-10-CM | POA: Diagnosis not present

## 2017-11-18 LAB — BMP8+ANION GAP
ANION GAP: 15 mmol/L (ref 10.0–18.0)
BUN/Creatinine Ratio: 9 — ABNORMAL LOW (ref 10–24)
BUN: 7 mg/dL — ABNORMAL LOW (ref 8–27)
CALCIUM: 8.7 mg/dL (ref 8.6–10.2)
CO2: 19 mmol/L — ABNORMAL LOW (ref 20–29)
CREATININE: 0.74 mg/dL — AB (ref 0.76–1.27)
Chloride: 105 mmol/L (ref 96–106)
GFR calc Af Amer: 98 mL/min/{1.73_m2} (ref >59)
GFR calc non Af Amer: 85 mL/min/{1.73_m2} (ref >59)
Glucose: 80 mg/dL (ref 65–99)
Potassium: 4.4 mmol/L (ref 3.5–5.2)
Sodium: 139 mmol/L (ref 134–144)

## 2017-11-18 NOTE — Telephone Encounter (Signed)
Called pt's [name] [name] - informed her father's NA+ level is normal @ 139 per Dr Hetty Ely.

## 2017-11-18 NOTE — Telephone Encounter (Signed)
-----   Message from Ledell Noss, MD sent at 11/18/2017 12:01 PM EST ----- Please call and notify Mr. Vitug daughter of normal sodium.  Thank you

## 2017-11-18 NOTE — Telephone Encounter (Signed)
On review of records after recent inpatient hospitalization, noted that Dr. Elie Confer spoke with daughter about warfarin adjustments with ciprofloxacin. He was supposed to stop ciprofloxacin as he had no evidence of UTI.   I called and spoke with [name], who said he had stopped taking it after 1.5 days. He was seen yesterday in clinic by Dr. Hetty Ely, and [name] thought he was supposed to get INR checked at that time. Most recent INR is 3.08 from 2/24, as far as I can tell scheduled to come to INR clinic on 3/18.   Dr. Elie Confer, I assume he should resume his regular dosing at this point. When would you like recheck INR?

## 2017-11-19 NOTE — Progress Notes (Signed)
Medicine attending: Medical history, presenting problems, physical findings, and medications, reviewed with resident physician Dr Nina Blum on the day of the patient visit and I concur with her evaluation and management plan. 

## 2017-11-21 DIAGNOSIS — R269 Unspecified abnormalities of gait and mobility: Secondary | ICD-10-CM | POA: Diagnosis not present

## 2017-11-21 DIAGNOSIS — I110 Hypertensive heart disease with heart failure: Secondary | ICD-10-CM | POA: Diagnosis not present

## 2017-11-21 DIAGNOSIS — G20 Parkinson's disease: Secondary | ICD-10-CM | POA: Diagnosis not present

## 2017-11-21 DIAGNOSIS — R001 Bradycardia, unspecified: Secondary | ICD-10-CM | POA: Diagnosis not present

## 2017-11-21 DIAGNOSIS — F0280 Dementia in other diseases classified elsewhere without behavioral disturbance: Secondary | ICD-10-CM | POA: Diagnosis not present

## 2017-11-21 DIAGNOSIS — R531 Weakness: Secondary | ICD-10-CM | POA: Diagnosis not present

## 2017-11-22 ENCOUNTER — Telehealth: Payer: Self-pay | Admitting: Pharmacist

## 2017-11-22 DIAGNOSIS — R001 Bradycardia, unspecified: Secondary | ICD-10-CM | POA: Diagnosis not present

## 2017-11-22 DIAGNOSIS — G20 Parkinson's disease: Secondary | ICD-10-CM | POA: Diagnosis not present

## 2017-11-22 DIAGNOSIS — R531 Weakness: Secondary | ICD-10-CM | POA: Diagnosis not present

## 2017-11-22 DIAGNOSIS — I110 Hypertensive heart disease with heart failure: Secondary | ICD-10-CM | POA: Diagnosis not present

## 2017-11-22 DIAGNOSIS — R269 Unspecified abnormalities of gait and mobility: Secondary | ICD-10-CM | POA: Diagnosis not present

## 2017-11-22 DIAGNOSIS — F0280 Dementia in other diseases classified elsewhere without behavioral disturbance: Secondary | ICD-10-CM | POA: Diagnosis not present

## 2017-11-22 NOTE — Telephone Encounter (Signed)
Pt seen on 11/17/17 for HFU.Despina Hidden Cassady3/5/201910:46 AM

## 2017-11-22 NOTE — Telephone Encounter (Signed)
[name] [name] (Daughter) had been instructed to bring him to [facility] on 4-MAR-19. I will call her and remind her to bring him this week. Thank you.

## 2017-11-22 NOTE — Telephone Encounter (Signed)
Reminded patient's daughter [name]) of need for an INR determination this week if possible--but no later than next Monday 11-MAR-19.

## 2017-11-23 DIAGNOSIS — R001 Bradycardia, unspecified: Secondary | ICD-10-CM | POA: Diagnosis not present

## 2017-11-23 DIAGNOSIS — R531 Weakness: Secondary | ICD-10-CM | POA: Diagnosis not present

## 2017-11-23 DIAGNOSIS — I110 Hypertensive heart disease with heart failure: Secondary | ICD-10-CM | POA: Diagnosis not present

## 2017-11-23 DIAGNOSIS — R269 Unspecified abnormalities of gait and mobility: Secondary | ICD-10-CM | POA: Diagnosis not present

## 2017-11-23 DIAGNOSIS — G20 Parkinson's disease: Secondary | ICD-10-CM | POA: Diagnosis not present

## 2017-11-23 DIAGNOSIS — F0280 Dementia in other diseases classified elsewhere without behavioral disturbance: Secondary | ICD-10-CM | POA: Diagnosis not present

## 2017-11-24 DIAGNOSIS — I110 Hypertensive heart disease with heart failure: Secondary | ICD-10-CM | POA: Diagnosis not present

## 2017-11-24 DIAGNOSIS — F0280 Dementia in other diseases classified elsewhere without behavioral disturbance: Secondary | ICD-10-CM | POA: Diagnosis not present

## 2017-11-24 DIAGNOSIS — R269 Unspecified abnormalities of gait and mobility: Secondary | ICD-10-CM | POA: Diagnosis not present

## 2017-11-24 DIAGNOSIS — G20 Parkinson's disease: Secondary | ICD-10-CM | POA: Diagnosis not present

## 2017-11-24 DIAGNOSIS — R001 Bradycardia, unspecified: Secondary | ICD-10-CM | POA: Diagnosis not present

## 2017-11-24 DIAGNOSIS — R531 Weakness: Secondary | ICD-10-CM | POA: Diagnosis not present

## 2017-11-28 DIAGNOSIS — R531 Weakness: Secondary | ICD-10-CM | POA: Diagnosis not present

## 2017-11-28 DIAGNOSIS — G20 Parkinson's disease: Secondary | ICD-10-CM | POA: Diagnosis not present

## 2017-11-28 DIAGNOSIS — R001 Bradycardia, unspecified: Secondary | ICD-10-CM | POA: Diagnosis not present

## 2017-11-28 DIAGNOSIS — I110 Hypertensive heart disease with heart failure: Secondary | ICD-10-CM | POA: Diagnosis not present

## 2017-11-28 DIAGNOSIS — R269 Unspecified abnormalities of gait and mobility: Secondary | ICD-10-CM | POA: Diagnosis not present

## 2017-11-28 DIAGNOSIS — F0280 Dementia in other diseases classified elsewhere without behavioral disturbance: Secondary | ICD-10-CM | POA: Diagnosis not present

## 2017-11-29 ENCOUNTER — Encounter: Payer: Self-pay | Admitting: *Deleted

## 2017-11-29 DIAGNOSIS — R269 Unspecified abnormalities of gait and mobility: Secondary | ICD-10-CM | POA: Diagnosis not present

## 2017-11-29 DIAGNOSIS — G20 Parkinson's disease: Secondary | ICD-10-CM | POA: Diagnosis not present

## 2017-11-29 DIAGNOSIS — R531 Weakness: Secondary | ICD-10-CM | POA: Diagnosis not present

## 2017-11-29 DIAGNOSIS — F0280 Dementia in other diseases classified elsewhere without behavioral disturbance: Secondary | ICD-10-CM | POA: Diagnosis not present

## 2017-11-29 DIAGNOSIS — I110 Hypertensive heart disease with heart failure: Secondary | ICD-10-CM | POA: Diagnosis not present

## 2017-11-29 DIAGNOSIS — R001 Bradycardia, unspecified: Secondary | ICD-10-CM | POA: Diagnosis not present

## 2017-11-29 NOTE — Addendum Note (Signed)
Addended by: Hulan Fray on: 11/29/2017 06:42 PM   Modules accepted: Orders

## 2017-11-30 DIAGNOSIS — R531 Weakness: Secondary | ICD-10-CM | POA: Diagnosis not present

## 2017-11-30 DIAGNOSIS — I110 Hypertensive heart disease with heart failure: Secondary | ICD-10-CM | POA: Diagnosis not present

## 2017-11-30 DIAGNOSIS — R269 Unspecified abnormalities of gait and mobility: Secondary | ICD-10-CM | POA: Diagnosis not present

## 2017-11-30 DIAGNOSIS — R001 Bradycardia, unspecified: Secondary | ICD-10-CM | POA: Diagnosis not present

## 2017-11-30 DIAGNOSIS — G20 Parkinson's disease: Secondary | ICD-10-CM | POA: Diagnosis not present

## 2017-11-30 DIAGNOSIS — F0280 Dementia in other diseases classified elsewhere without behavioral disturbance: Secondary | ICD-10-CM | POA: Diagnosis not present

## 2017-12-01 DIAGNOSIS — R531 Weakness: Secondary | ICD-10-CM | POA: Diagnosis not present

## 2017-12-01 DIAGNOSIS — G20 Parkinson's disease: Secondary | ICD-10-CM | POA: Diagnosis not present

## 2017-12-01 DIAGNOSIS — I110 Hypertensive heart disease with heart failure: Secondary | ICD-10-CM | POA: Diagnosis not present

## 2017-12-01 DIAGNOSIS — R001 Bradycardia, unspecified: Secondary | ICD-10-CM | POA: Diagnosis not present

## 2017-12-01 DIAGNOSIS — F0280 Dementia in other diseases classified elsewhere without behavioral disturbance: Secondary | ICD-10-CM | POA: Diagnosis not present

## 2017-12-01 DIAGNOSIS — R269 Unspecified abnormalities of gait and mobility: Secondary | ICD-10-CM | POA: Diagnosis not present

## 2017-12-01 LAB — CUP PACEART REMOTE DEVICE CHECK
Date Time Interrogation Session: 20190213175243
MDC IDC PG IMPLANT DT: 20160328

## 2017-12-02 DIAGNOSIS — I110 Hypertensive heart disease with heart failure: Secondary | ICD-10-CM | POA: Diagnosis not present

## 2017-12-02 DIAGNOSIS — F0280 Dementia in other diseases classified elsewhere without behavioral disturbance: Secondary | ICD-10-CM | POA: Diagnosis not present

## 2017-12-02 DIAGNOSIS — R269 Unspecified abnormalities of gait and mobility: Secondary | ICD-10-CM | POA: Diagnosis not present

## 2017-12-02 DIAGNOSIS — R531 Weakness: Secondary | ICD-10-CM | POA: Diagnosis not present

## 2017-12-02 DIAGNOSIS — G20 Parkinson's disease: Secondary | ICD-10-CM | POA: Diagnosis not present

## 2017-12-02 DIAGNOSIS — R001 Bradycardia, unspecified: Secondary | ICD-10-CM | POA: Diagnosis not present

## 2017-12-05 ENCOUNTER — Ambulatory Visit (INDEPENDENT_AMBULATORY_CARE_PROVIDER_SITE_OTHER): Payer: Medicare HMO | Admitting: Pharmacist

## 2017-12-05 ENCOUNTER — Ambulatory Visit (INDEPENDENT_AMBULATORY_CARE_PROVIDER_SITE_OTHER): Payer: Medicare HMO | Admitting: *Deleted

## 2017-12-05 DIAGNOSIS — Z86711 Personal history of pulmonary embolism: Secondary | ICD-10-CM | POA: Diagnosis not present

## 2017-12-05 DIAGNOSIS — R531 Weakness: Secondary | ICD-10-CM | POA: Diagnosis not present

## 2017-12-05 DIAGNOSIS — Z5181 Encounter for therapeutic drug level monitoring: Secondary | ICD-10-CM | POA: Diagnosis not present

## 2017-12-05 DIAGNOSIS — Z7902 Long term (current) use of antithrombotics/antiplatelets: Secondary | ICD-10-CM | POA: Diagnosis not present

## 2017-12-05 DIAGNOSIS — G20 Parkinson's disease: Secondary | ICD-10-CM | POA: Diagnosis not present

## 2017-12-05 DIAGNOSIS — R269 Unspecified abnormalities of gait and mobility: Secondary | ICD-10-CM | POA: Diagnosis not present

## 2017-12-05 DIAGNOSIS — R55 Syncope and collapse: Secondary | ICD-10-CM

## 2017-12-05 DIAGNOSIS — R001 Bradycardia, unspecified: Secondary | ICD-10-CM | POA: Diagnosis not present

## 2017-12-05 DIAGNOSIS — Z7901 Long term (current) use of anticoagulants: Secondary | ICD-10-CM

## 2017-12-05 DIAGNOSIS — I110 Hypertensive heart disease with heart failure: Secondary | ICD-10-CM | POA: Diagnosis not present

## 2017-12-05 DIAGNOSIS — F0280 Dementia in other diseases classified elsewhere without behavioral disturbance: Secondary | ICD-10-CM | POA: Diagnosis not present

## 2017-12-05 LAB — POCT INR: INR: 2.4

## 2017-12-05 NOTE — Progress Notes (Signed)
INTERNAL MEDICINE TEACHING ATTENDING ADDENDUM - Shyheim Tanney M.D  Duration- indefinite, Indication- recurrent PE, INR- therapeutic. Agree with pharmacy recommendations as outlined in their note.      

## 2017-12-05 NOTE — Progress Notes (Signed)
Anticoagulation Management [name] [name] is a 84 y.o. male who reports to the clinic for monitoring of warfarin treatment.    Indication: PE, history of; history of multiple VTE episodes; Encounter for long-term use of antiplatelets/antithrombotics.   Duration: indefinite Supervising physician: Aldine Contes  Anticoagulation Clinic Visit History: Patient does not report signs/symptoms of bleeding or thromboembolism  Other recent changes: No diet, medications, lifestyle endorsed by the patient's daughter to me at this visit.  Anticoagulation Episode Summary    Current INR goal:   2.0-3.0  TTR:   74.0 % (6.9 y)  Next INR check:   01/09/2018  INR from last check:   2.40 (12/05/2017)  Weekly max warfarin dose:     Target end date:   Indefinite  INR check location:   [facility]  Preferred lab:     Send INR reminders to:   [facility]   Indications   History of pulmonary embolism [Z86.711] Encounter for long-term use of antiplatelets/antithrombotics [Z79.02]       Comments:   History of multiple venous embolic episodes. Will continue to annual re-evaluate continued need for warfarin weighing risks vs. benefits.       Anticoagulation Care Providers    Provider Role Specialty Phone number   Bertha Stakes, MD  Internal Medicine [phone]      Allergies  Allergen Reactions  . Aricept [Donepezil Hcl] Other (See Comments)    Symptomatic Bradycardia.  . Other Other (See Comments)    Shrimp gives him gout  . Shellfish-Derived Products Other (See Comments)    Causes a flare up with Gout   Prior to Admission medications   Medication Sig Start Date End Date Taking? Authorizing Provider  acetaminophen (TYLENOL) 500 MG tablet Take 1,000 mg by mouth 2 (two) times daily.    Yes [provider]  amLODipine (NORVASC) 2.5 MG tablet Take 1 tablet (2.5 mg total) by mouth daily. 11/17/17 02/15/18 Yes Ledell Noss, MD  b complex vitamins tablet Take 1 tablet by mouth every  morning.    Yes [provider]  Ca Phosphate-Cholecalciferol (CALCIUM/VITAMIN D3 GUMMIES) 250-350 MG-UNIT CHEW Chew 1 Dose by mouth 2 (two) times daily. 07/08/16  Yes [name], DO  carbidopa-levodopa (SINEMET IR) 25-100 MG tablet Take 1 tablet by mouth 4 (four) times daily. 10/28/17  Yes [name], DO  fish oil-omega-3 fatty acids 1000 MG capsule Take 2 g by mouth every morning.    Yes [provider]  memantine (NAMENDA XR) 28 MG CP24 24 hr capsule Take 1 capsule (28 mg total) by mouth every morning. 04/25/17  Yes Garvin Fila, MD  Multiple Vitamins-Minerals (MULTIVITAMIN WITH MINERALS) tablet Take 1 tablet by mouth every morning.    Yes [provider]  polyethylene glycol (MIRALAX / GLYCOLAX) packet Take 17 g by mouth daily as needed (constipation).   Yes [provider]  potassium chloride SA (KLOR-CON M20) 20 MEQ tablet Take 2 tablets (40 mEq total) by mouth daily. 09/29/17  Yes [name], DO  QUEtiapine (SEROQUEL) 25 MG tablet Take 1 tablet (25 mg total) by mouth at bedtime. May use half a tablet 12.5 mg during the daytime as needed for agitation/hallucinations Patient taking differently: Take 12.5 mg by mouth as needed (sleep). May use half a tablet 12.5 mg during the daytime as needed for agitation/hallucinations 04/05/17  Yes Garvin Fila, MD  senna (SENOKOT) 8.6 MG TABS tablet Take 1 tablet (8.6 mg total) by mouth daily as needed for  mild constipation. 07/13/16  Yes Velna Ochs, MD  vitamin C (ASCORBIC ACID) 500 MG tablet Take 1,000 mg by mouth every morning.    Yes [provider]  warfarin (COUMADIN) 5 MG tablet Take 0.5-1 tablets (2.5-5 mg total) by mouth See admin instructions. Take 1 tablet on Mon, and Fri. Take 1/2 tablet all other days Patient taking differently: Take 2.5-5 mg by mouth See admin instructions. 5mg  Mon, Wed - 2.5mg  all other days 05/26/17  Yes [name], DO   Past Medical History:  Diagnosis  Date  . Anxiety   . Coronary artery disease, non-occlusive    with history of MI; Cath 2008 w multivessel nonobstructive CAD  . Dementia   . Depression   . GERD (gastroesophageal reflux disease)   . Hyperlipidemia   . Hypertension   . Memory loss   . Parkinson disease ([place])   . PE (pulmonary embolism)    unprovoked PE completed 6 months of warfarin, warfarin d/ced 02/12/2010, repeat PE 07/10/2010 after a long car ride and now on lifelong coumadin.  . Pituitary microadenoma ([place])    incidental finding CT 12/2009  . Prostate cancer ([place])   . Scoliosis   . Sigmoid volvulus ([place]) 08/02/2016  . Volvulus of colon ([place]) 06/01/2017   Social History   Socioeconomic History  . Marital status: Married    Spouse name: Not on file  . Number of children: 4  . Years of education: master's  . Highest education level: Not on file  Social Needs  . Financial resource strain: Not on file  . Food insecurity - worry: Not on file  . Food insecurity - inability: Not on file  . Transportation needs - medical: Not on file  . Transportation needs - non-medical: Not on file  Occupational History  . Occupation: Retired    Fish farm manager: RETIRED  Tobacco Use  . Smoking status: Never Smoker  . Smokeless tobacco: Never Used  Substance and Sexual Activity  . Alcohol use: No    Alcohol/week: 0.0 oz  . Drug use: No  . Sexual activity: No  Other Topics Concern  . Not on file  Social History Narrative   Lives with daughter. Wife also has severe dementia.    Drinks 1 cup of coffee a day    Family History  Problem Relation Age of Onset  . Hypertension Father        Passed away from cerebral hemorrhage at age of 77.  [name] Dementia Mother   . Hypertension Child        4 adult children  . Cervical cancer Other   . Lung cancer Other   . Stroke Other   . Heart attack Neg Hx     ASSESSMENT Recent Results: The most recent result is correlated with 25 mg per week: Lab Results  Component Value Date   INR  2.40 12/05/2017   INR 3.08 11/13/2017   INR 3.34 11/12/2017    Anticoagulation Dosing: Description   Give 1/2 tablet by mouth on Sundays, Tuesdays, Thursdays and Saturdays; all other days--give 1 tablet by mouth once-daily at 6PM.      INR today: Therapeutic  PLAN Weekly dose was unchanged.  Patient Instructions  Patient's daughter instructed to give medications as defined in the Anti-coagulation Track section of this encounter.  Patient's daughter instructed to give today's dose.  Patient's daughter instructed to give 1/2 tablet by mouth on Sundays, Tuesdays, Thursdays and Saturdays; all other days--give 1 tablet by mouth once-daily  at [facility] Patient's daughter verbalized understanding of these instructions.     Patient advised to contact clinic or seek medical attention if signs/symptoms of bleeding or thromboembolism occur.  Patient verbalized understanding by repeating back information and was advised to contact me if further medication-related questions arise. Patient was also provided an information handout.  Follow-up Return in 5 weeks (on 01/09/2018) for Follow up INR at 3:45PM.  Pennie Banter, PharmD, CACP,CPP  15 minutes spent face-to-face with the patient during the encounter. 50% of time spent on education. 50% of time was spent on fingerstick point of care INR sample collection, processing, results determination, documentation in [email or web address].

## 2017-12-05 NOTE — Patient Instructions (Signed)
Patient's daughter instructed to give medications as defined in the Anti-coagulation Track section of this encounter.  Patient's daughter instructed to give today's dose.  Patient's daughter instructed to give 1/2 tablet by mouth on Sundays, Tuesdays, Thursdays and Saturdays; all other days--give 1 tablet by mouth once-daily at [place] Patient's daughter verbalized understanding of these instructions.

## 2017-12-06 ENCOUNTER — Ambulatory Visit: Payer: Medicare HMO | Admitting: Internal Medicine

## 2017-12-06 VITALS — BP 128/62 | HR 57

## 2017-12-06 DIAGNOSIS — I5022 Chronic systolic (congestive) heart failure: Secondary | ICD-10-CM | POA: Diagnosis not present

## 2017-12-06 DIAGNOSIS — G20 Parkinson's disease: Secondary | ICD-10-CM | POA: Diagnosis not present

## 2017-12-06 DIAGNOSIS — R55 Syncope and collapse: Secondary | ICD-10-CM | POA: Diagnosis not present

## 2017-12-06 DIAGNOSIS — I110 Hypertensive heart disease with heart failure: Secondary | ICD-10-CM | POA: Diagnosis not present

## 2017-12-06 DIAGNOSIS — R001 Bradycardia, unspecified: Secondary | ICD-10-CM | POA: Diagnosis not present

## 2017-12-06 DIAGNOSIS — F0280 Dementia in other diseases classified elsewhere without behavioral disturbance: Secondary | ICD-10-CM | POA: Diagnosis not present

## 2017-12-06 DIAGNOSIS — R531 Weakness: Secondary | ICD-10-CM | POA: Diagnosis not present

## 2017-12-06 DIAGNOSIS — R269 Unspecified abnormalities of gait and mobility: Secondary | ICD-10-CM | POA: Diagnosis not present

## 2017-12-06 LAB — CUP PACEART INCLINIC DEVICE CHECK
Date Time Interrogation Session: 20190319162831
MDC IDC PG IMPLANT DT: 20160328

## 2017-12-06 NOTE — Progress Notes (Signed)
Carelink Summary Report / Loop Recorder 

## 2017-12-06 NOTE — Patient Instructions (Addendum)
Medication Instructions:  Your physician recommends that you continue on your current medications as directed. Please refer to the Current Medication list given to you today.  Labwork: None ordered.  Testing/Procedures: None ordered.  Follow-Up: Your physician recommends that you schedule a follow-up appointment as needed with Dr [name]  Remote monitoring is used to monitor your Pacemaker from home. This monitoring reduces the number of office visits required to check your device to one time per year. It allows [name] to keep an eye on the functioning of your device to ensure it is working properly. You are scheduled for a device check from home on 01/09/2018. You may send your transmission at any time that day. If you have a wireless device, the transmission will be sent automatically. After your physician reviews your transmission, you will receive a postcard with your next transmission date.   Any Other Special Instructions Will Be Listed Below (If Applicable).     If you need a refill on your cardiac medications before your next appointment, please call your pharmacy.

## 2017-12-06 NOTE — Progress Notes (Signed)
Electrophysiology Office Note   Date:  12/06/2017   ID:  Mohsen Odenthal, DOB [birth year], MRN [id number]  PCP:  [name], DO  Cardiologist:   Primary Electrophysiologist:   [name], MD    No chief complaint on file.    History of Present Illness: [name] [name] is a 84 y.o. male     seen in follow-up for syncope and some degrees of bradycardia. After lengthy discussions, not withstanding underlying conduction system disease and identification frequent PVCs as potential explanation for his "bradycardia", it was elected to put in an event recorder.  Recently seen for failure to thrive decreased mental status and chest pain.  He was found to be hyponatremic and was rehydrated.  Since then he has been doing relatively well.  He is eating and drinking reasonably.  No recurrent syncope.      Past Medical History:  Diagnosis Date  . Anxiety   . Coronary artery disease, non-occlusive    with history of MI; Cath 2008 w multivessel nonobstructive CAD  . Dementia   . Depression   . GERD (gastroesophageal reflux disease)   . Hyperlipidemia   . Hypertension   . Memory loss   . Parkinson disease ([place])   . PE (pulmonary embolism)    unprovoked PE completed 6 months of warfarin, warfarin d/ced 02/12/2010, repeat PE 07/10/2010 after a long car ride and now on lifelong coumadin.  . Pituitary microadenoma ([place])    incidental finding CT 12/2009  . Prostate cancer ([place])   . Scoliosis   . Sigmoid volvulus ([place]) 08/02/2016  . Volvulus of colon ([place]) 06/01/2017   Past Surgical History:  Procedure Laterality Date  . CATARACT EXTRACTION    . COLONOSCOPY N/A 05/30/2017   Procedure: COLONOSCOPY;  Surgeon: Ronald Lobo, MD;  Location: [facility] ENDOSCOPY;  Service: Endoscopy;  Laterality: N/A;  . FLEXIBLE SIGMOIDOSCOPY N/A 08/02/2016   Procedure: FLEXIBLE SIGMOIDOSCOPY;  Surgeon: Ronald Lobo, MD;  Location: [facility] ENDOSCOPY;  Service: Endoscopy;  Laterality: N/A;  . FLEXIBLE SIGMOIDOSCOPY  N/A 08/06/2016   Procedure: FLEXIBLE SIGMOIDOSCOPY;  Surgeon: Ronald Lobo, MD;  Location: [facility] ENDOSCOPY;  Service: Endoscopy;  Laterality: N/A;  . FLEXIBLE SIGMOIDOSCOPY Left 06/02/2017   Procedure: FLEXIBLE SIGMOIDOSCOPY;  Surgeon: Arta Silence, MD;  Location: [facility] ENDOSCOPY;  Service: Endoscopy;  Laterality: Left;  . FLEXIBLE SIGMOIDOSCOPY N/A 06/07/2017   Procedure: FLEXIBLE SIGMOIDOSCOPY for possible decompression;  Surgeon: Otis Brace, MD;  Location: [facility];  Service: Gastroenterology;  Laterality: N/A;  . LEFT HEART CATHETERIZATION WITH CORONARY ANGIOGRAM N/A 12/10/2014   Procedure: LEFT HEART CATHETERIZATION WITH CORONARY ANGIOGRAM;  Surgeon: [name], MD;  Location: [facility] CATH LAB;  Service: Cardiovascular;  Laterality: N/A;  . LOOP RECORDER IMPLANT N/A 12/16/2014   Procedure: LOOP RECORDER IMPLANT;  Surgeon: [name], MD;  Location[facility] CATH LAB;  Service: Cardiovascular;  Laterality: N/A;  . PARTIAL COLECTOMY N/A 06/08/2017   Procedure: SIGMOID  COLECTOMY;  Surgeon: Georganna Skeans, MD;  Location: [facility];  Service: General;  Laterality: N/A;  . PROSTATECTOMY       Current Outpatient Medications  Medication Sig Dispense Refill  . acetaminophen (TYLENOL) 500 MG tablet Take 1,000 mg by mouth 2 (two) times daily.     [name] amLODipine (NORVASC) 2.5 MG tablet Take 1 tablet (2.5 mg total) by mouth daily. 90 tablet 1  . b complex vitamins tablet Take 1 tablet by mouth every morning.     . Ca Phosphate-Cholecalciferol (CALCIUM/VITAMIN D3 GUMMIES) 250-350 MG-UNIT CHEW Chew  1 Dose by mouth 2 (two) times daily. 180 tablet 3  . carbidopa-levodopa (SINEMET IR) 25-100 MG tablet Take 1 tablet by mouth 4 (four) times daily. 120 tablet 2  . fish oil-omega-3 fatty acids 1000 MG capsule Take 2 g by mouth every morning.     . memantine (NAMENDA XR) 28 MG CP24 24 hr capsule Take 1 capsule (28 mg total) by mouth every morning. 90 capsule 3  . Multiple Vitamins-Minerals (MULTIVITAMIN WITH  MINERALS) tablet Take 1 tablet by mouth every morning.     . polyethylene glycol (MIRALAX / GLYCOLAX) packet Take 17 g by mouth daily as needed (constipation).    . potassium chloride SA (KLOR-CON M20) 20 MEQ tablet Take 2 tablets (40 mEq total) by mouth daily. 180 tablet 1  . QUEtiapine (SEROQUEL) 25 MG tablet Take 1 tablet (25 mg total) by mouth at bedtime. May use half a tablet 12.5 mg during the daytime as needed for agitation/hallucinations (Patient taking differently: Take 12.5 mg by mouth as needed (sleep). May use half a tablet 12.5 mg during the daytime as needed for agitation/hallucinations) 90 tablet 4  . senna (SENOKOT) 8.6 MG TABS tablet Take 1 tablet (8.6 mg total) by mouth daily as needed for mild constipation. 120 each 0  . vitamin C (ASCORBIC ACID) 500 MG tablet Take 1,000 mg by mouth every morning.     . warfarin (COUMADIN) 5 MG tablet Take 0.5-1 tablets (2.5-5 mg total) by mouth See admin instructions. Take 1 tablet on Mon, and Fri. Take 1/2 tablet all other days (Patient taking differently: Take 2.5-5 mg by mouth See admin instructions. 5mg  Mon, Wed - 2.5mg  all other days) 30 tablet 3   No current facility-administered medications for this visit.     Allergies:   Aricept [donepezil hcl]; Other; and Shellfish-derived products   Social History:  The patient  reports that  has never smoked. he has never used smokeless tobacco. He reports that he does not drink alcohol or use drugs.   Family History:  The patient's    family history includes Cervical cancer in his other; Dementia in his mother; Hypertension in his child and father; Lung cancer in his other; Stroke in his other.    ROS:  Please see the history of present illness.   Otherwise, review of systems is negative *.    PHYSICAL EXAM: VS:  BP 128/62   Pulse (!) 57   SpO2 98%  , BMI There is no height or weight on file to calculate BMI. Well developed and nourished in no acute distress sitting in a wheel chair Minimally  verbal  HENT normal Neck supple  Carotids brisk and full without bruits Clear Regular rate and rhythm, no murmurs or gallops Abd-soft with active BS without hepatomegaly No Clubbing cyanosis edema Skin-warm and dry A & Oriented  Grossly normal sensory and motor function  ECG sinus @ 64 19/14/44 RBBB  Recent Labs: 05/06/2017: TSH 0.847 06/13/2017: Magnesium 1.8 11/12/2017: ALT 8 11/13/2017: Hemoglobin 12.5; Platelets 162 11/17/2017: BUN 7; Creatinine, Ser 0.74; Potassium 4.4; Sodium 139    Lipid Panel     Component Value Date/Time   CHOL 183 07/08/2016 1035   TRIG 95 07/08/2016 1035   HDL 82 07/08/2016 1035   CHOLHDL 2.2 07/08/2016 1035   CHOLHDL 3.7 06/30/2012 1153   VLDL 26 06/30/2012 1153   LDLCALC 82 07/08/2016 1035     Wt Readings from Last 3 Encounters:  11/12/17 136 lb (61.7 kg)  11/10/17  124 lb (56.2 kg)  09/09/17 132 lb (59.9 kg)      Other studies Reviewed: Additional studies/ records that were reviewed today include LHC 12/10/14 Left main: Large caliber normal long vessel which bifurcated into an LAD and left circumflex vessel.  LAD: Diagonal prox 40-50% (small). LAD after Dx 50%; mid LAD with significant mid systolic bridging with narrowing up to 80% during systole that improved with IC nitroglycerin and with diastole 20% narrowed. Left circumflex: Small tortuous vessel which gave rise to 2 OM branches.  Right coronary artery: Large dominant normal vessel that supplied the PDA and PLA vessel. Left ventriculography: EF 50-55%. There was ectopy without definitive wall motion abnormalities.  Echo 12/09/14 - EF 40% to 45%. [facility] 12/10/14 Left main: Large caliber normal long vessel which bifurcated into an LAD and left circumflex vessel.  LAD: Diagonal prox 40-50% (small). LAD after Dx 50%; mid LAD with significant mid systolic bridging with narrowing up to 80% during systole that improved with IC nitroglycerin and with diastole 20%  narrowed. Left circumflex: Small tortuous vessel which gave rise to 2 OM branches.  Right coronary artery: Large dominant normal vessel that supplied the PDA and PLA vessel. Left ventriculography: EF 50-55%. There was ectopy without definitive wall motion abnormalities.      ASSESSMENT AND PLAN: 1. Syncope with symptomatic bradycardia   2. CAD - nonobstructive    3. Recurrent PE   4. HTN  5. Parkinson's  6. Orthostatic lightheadedness  7.  LINQ    mostly wheel chair bound  Palliative care is now involved.   No recurrent syncope   y.   The patient does not have concerns regarding his medicines.  The following changes were made today:  none    Disposition:   FU with me  12 months   Signed, [name], MD  12/06/2017 3:03 PM     [facility] [address] [place] [address] [phone] (office) [phone] (fax)

## 2017-12-07 DIAGNOSIS — R269 Unspecified abnormalities of gait and mobility: Secondary | ICD-10-CM | POA: Diagnosis not present

## 2017-12-07 DIAGNOSIS — R531 Weakness: Secondary | ICD-10-CM | POA: Diagnosis not present

## 2017-12-07 DIAGNOSIS — R001 Bradycardia, unspecified: Secondary | ICD-10-CM | POA: Diagnosis not present

## 2017-12-07 DIAGNOSIS — I110 Hypertensive heart disease with heart failure: Secondary | ICD-10-CM | POA: Diagnosis not present

## 2017-12-07 DIAGNOSIS — G20 Parkinson's disease: Secondary | ICD-10-CM | POA: Diagnosis not present

## 2017-12-07 DIAGNOSIS — F0280 Dementia in other diseases classified elsewhere without behavioral disturbance: Secondary | ICD-10-CM | POA: Diagnosis not present

## 2017-12-07 NOTE — Addendum Note (Signed)
Addended by: Campbell Riches on: 12/07/2017 01:45 PM   Modules accepted: Orders

## 2017-12-08 DIAGNOSIS — F0280 Dementia in other diseases classified elsewhere without behavioral disturbance: Secondary | ICD-10-CM | POA: Diagnosis not present

## 2017-12-08 DIAGNOSIS — R269 Unspecified abnormalities of gait and mobility: Secondary | ICD-10-CM | POA: Diagnosis not present

## 2017-12-08 DIAGNOSIS — G20 Parkinson's disease: Secondary | ICD-10-CM | POA: Diagnosis not present

## 2017-12-08 DIAGNOSIS — R001 Bradycardia, unspecified: Secondary | ICD-10-CM | POA: Diagnosis not present

## 2017-12-08 DIAGNOSIS — I110 Hypertensive heart disease with heart failure: Secondary | ICD-10-CM | POA: Diagnosis not present

## 2017-12-08 DIAGNOSIS — R531 Weakness: Secondary | ICD-10-CM | POA: Diagnosis not present

## 2017-12-09 DIAGNOSIS — I110 Hypertensive heart disease with heart failure: Secondary | ICD-10-CM | POA: Diagnosis not present

## 2017-12-09 DIAGNOSIS — F0280 Dementia in other diseases classified elsewhere without behavioral disturbance: Secondary | ICD-10-CM | POA: Diagnosis not present

## 2017-12-09 DIAGNOSIS — R001 Bradycardia, unspecified: Secondary | ICD-10-CM | POA: Diagnosis not present

## 2017-12-09 DIAGNOSIS — R269 Unspecified abnormalities of gait and mobility: Secondary | ICD-10-CM | POA: Diagnosis not present

## 2017-12-09 DIAGNOSIS — R531 Weakness: Secondary | ICD-10-CM | POA: Diagnosis not present

## 2017-12-09 DIAGNOSIS — G20 Parkinson's disease: Secondary | ICD-10-CM | POA: Diagnosis not present

## 2017-12-12 DIAGNOSIS — F0280 Dementia in other diseases classified elsewhere without behavioral disturbance: Secondary | ICD-10-CM | POA: Diagnosis not present

## 2017-12-12 DIAGNOSIS — R001 Bradycardia, unspecified: Secondary | ICD-10-CM | POA: Diagnosis not present

## 2017-12-12 DIAGNOSIS — G20 Parkinson's disease: Secondary | ICD-10-CM | POA: Diagnosis not present

## 2017-12-12 DIAGNOSIS — R269 Unspecified abnormalities of gait and mobility: Secondary | ICD-10-CM | POA: Diagnosis not present

## 2017-12-12 DIAGNOSIS — R531 Weakness: Secondary | ICD-10-CM | POA: Diagnosis not present

## 2017-12-12 DIAGNOSIS — I110 Hypertensive heart disease with heart failure: Secondary | ICD-10-CM | POA: Diagnosis not present

## 2017-12-13 DIAGNOSIS — G20 Parkinson's disease: Secondary | ICD-10-CM | POA: Diagnosis not present

## 2017-12-13 DIAGNOSIS — R531 Weakness: Secondary | ICD-10-CM | POA: Diagnosis not present

## 2017-12-13 DIAGNOSIS — F0280 Dementia in other diseases classified elsewhere without behavioral disturbance: Secondary | ICD-10-CM | POA: Diagnosis not present

## 2017-12-13 DIAGNOSIS — R001 Bradycardia, unspecified: Secondary | ICD-10-CM | POA: Diagnosis not present

## 2017-12-13 DIAGNOSIS — R269 Unspecified abnormalities of gait and mobility: Secondary | ICD-10-CM | POA: Diagnosis not present

## 2017-12-13 DIAGNOSIS — I110 Hypertensive heart disease with heart failure: Secondary | ICD-10-CM | POA: Diagnosis not present

## 2017-12-14 DIAGNOSIS — I110 Hypertensive heart disease with heart failure: Secondary | ICD-10-CM | POA: Diagnosis not present

## 2017-12-14 DIAGNOSIS — F0280 Dementia in other diseases classified elsewhere without behavioral disturbance: Secondary | ICD-10-CM | POA: Diagnosis not present

## 2017-12-14 DIAGNOSIS — R001 Bradycardia, unspecified: Secondary | ICD-10-CM | POA: Diagnosis not present

## 2017-12-14 DIAGNOSIS — R269 Unspecified abnormalities of gait and mobility: Secondary | ICD-10-CM | POA: Diagnosis not present

## 2017-12-14 DIAGNOSIS — R531 Weakness: Secondary | ICD-10-CM | POA: Diagnosis not present

## 2017-12-14 DIAGNOSIS — G20 Parkinson's disease: Secondary | ICD-10-CM | POA: Diagnosis not present

## 2017-12-15 DIAGNOSIS — R531 Weakness: Secondary | ICD-10-CM | POA: Diagnosis not present

## 2017-12-15 DIAGNOSIS — F0280 Dementia in other diseases classified elsewhere without behavioral disturbance: Secondary | ICD-10-CM | POA: Diagnosis not present

## 2017-12-15 DIAGNOSIS — R269 Unspecified abnormalities of gait and mobility: Secondary | ICD-10-CM | POA: Diagnosis not present

## 2017-12-15 DIAGNOSIS — I110 Hypertensive heart disease with heart failure: Secondary | ICD-10-CM | POA: Diagnosis not present

## 2017-12-15 DIAGNOSIS — G20 Parkinson's disease: Secondary | ICD-10-CM | POA: Diagnosis not present

## 2017-12-15 DIAGNOSIS — R001 Bradycardia, unspecified: Secondary | ICD-10-CM | POA: Diagnosis not present

## 2017-12-16 DIAGNOSIS — I110 Hypertensive heart disease with heart failure: Secondary | ICD-10-CM | POA: Diagnosis not present

## 2017-12-16 DIAGNOSIS — F0280 Dementia in other diseases classified elsewhere without behavioral disturbance: Secondary | ICD-10-CM | POA: Diagnosis not present

## 2017-12-16 DIAGNOSIS — R269 Unspecified abnormalities of gait and mobility: Secondary | ICD-10-CM | POA: Diagnosis not present

## 2017-12-16 DIAGNOSIS — G20 Parkinson's disease: Secondary | ICD-10-CM | POA: Diagnosis not present

## 2017-12-16 DIAGNOSIS — R531 Weakness: Secondary | ICD-10-CM | POA: Diagnosis not present

## 2017-12-16 DIAGNOSIS — R001 Bradycardia, unspecified: Secondary | ICD-10-CM | POA: Diagnosis not present

## 2017-12-19 DIAGNOSIS — I110 Hypertensive heart disease with heart failure: Secondary | ICD-10-CM | POA: Diagnosis not present

## 2017-12-19 DIAGNOSIS — R531 Weakness: Secondary | ICD-10-CM | POA: Diagnosis not present

## 2017-12-19 DIAGNOSIS — R001 Bradycardia, unspecified: Secondary | ICD-10-CM | POA: Diagnosis not present

## 2017-12-19 DIAGNOSIS — R32 Unspecified urinary incontinence: Secondary | ICD-10-CM | POA: Diagnosis not present

## 2017-12-19 DIAGNOSIS — G20 Parkinson's disease: Secondary | ICD-10-CM | POA: Diagnosis not present

## 2017-12-19 DIAGNOSIS — R159 Full incontinence of feces: Secondary | ICD-10-CM | POA: Diagnosis not present

## 2017-12-19 DIAGNOSIS — Z515 Encounter for palliative care: Secondary | ICD-10-CM | POA: Diagnosis not present

## 2017-12-19 DIAGNOSIS — M1A00X0 Idiopathic chronic gout, unspecified site, without tophus (tophi): Secondary | ICD-10-CM | POA: Diagnosis not present

## 2017-12-19 DIAGNOSIS — F0280 Dementia in other diseases classified elsewhere without behavioral disturbance: Secondary | ICD-10-CM | POA: Diagnosis not present

## 2017-12-19 DIAGNOSIS — R269 Unspecified abnormalities of gait and mobility: Secondary | ICD-10-CM | POA: Diagnosis not present

## 2017-12-20 DIAGNOSIS — F0280 Dementia in other diseases classified elsewhere without behavioral disturbance: Secondary | ICD-10-CM | POA: Diagnosis not present

## 2017-12-20 DIAGNOSIS — I110 Hypertensive heart disease with heart failure: Secondary | ICD-10-CM | POA: Diagnosis not present

## 2017-12-20 DIAGNOSIS — R531 Weakness: Secondary | ICD-10-CM | POA: Diagnosis not present

## 2017-12-20 DIAGNOSIS — R269 Unspecified abnormalities of gait and mobility: Secondary | ICD-10-CM | POA: Diagnosis not present

## 2017-12-20 DIAGNOSIS — G20 Parkinson's disease: Secondary | ICD-10-CM | POA: Diagnosis not present

## 2017-12-20 DIAGNOSIS — R001 Bradycardia, unspecified: Secondary | ICD-10-CM | POA: Diagnosis not present

## 2017-12-21 ENCOUNTER — Encounter: Payer: Self-pay | Admitting: Podiatry

## 2017-12-21 ENCOUNTER — Ambulatory Visit: Payer: Medicare HMO | Admitting: Podiatry

## 2017-12-21 DIAGNOSIS — M79676 Pain in unspecified toe(s): Secondary | ICD-10-CM | POA: Diagnosis not present

## 2017-12-21 DIAGNOSIS — R269 Unspecified abnormalities of gait and mobility: Secondary | ICD-10-CM | POA: Diagnosis not present

## 2017-12-21 DIAGNOSIS — R001 Bradycardia, unspecified: Secondary | ICD-10-CM | POA: Diagnosis not present

## 2017-12-21 DIAGNOSIS — G20 Parkinson's disease: Secondary | ICD-10-CM | POA: Diagnosis not present

## 2017-12-21 DIAGNOSIS — B351 Tinea unguium: Secondary | ICD-10-CM

## 2017-12-21 DIAGNOSIS — D689 Coagulation defect, unspecified: Secondary | ICD-10-CM | POA: Diagnosis not present

## 2017-12-21 DIAGNOSIS — F0280 Dementia in other diseases classified elsewhere without behavioral disturbance: Secondary | ICD-10-CM | POA: Diagnosis not present

## 2017-12-21 DIAGNOSIS — R531 Weakness: Secondary | ICD-10-CM | POA: Diagnosis not present

## 2017-12-21 DIAGNOSIS — I110 Hypertensive heart disease with heart failure: Secondary | ICD-10-CM | POA: Diagnosis not present

## 2017-12-21 NOTE — Progress Notes (Signed)
Patient ID: [name] [name], male   DOB: 1931-01-31, 84 y.o.   MRN: [id number] Complaint:  Visit Type: Patient returns to my office for continued preventative foot care services. Complaint: Patient states" my nails have grown long and thick and become painful to walk and wear shoes" . The patient presents for preventative foot care services. No changes to ROS.  Patient has not been seen in over 8 months.  Patient has been hospitalized.  Podiatric Exam: Vascular: dorsalis pedis and posterior tibial pulses are palpable bilateral. Capillary return is immediate. Temperature gradient is WNL. Skin turgor WNL  Sensorium: Normal Semmes Weinstein monofilament test. Normal tactile sensation bilaterally. Nail Exam: Pt has thick disfigured discolored nails with subungual debris noted bilateral entire nail hallux through fifth toenails Ulcer Exam: There is no evidence of ulcer or pre-ulcerative changes or infection. Orthopedic Exam: Muscle tone and strength are WNL. No limitations in general ROM. No crepitus or effusions noted. Foot type and digits show no abnormalities.  Contracture right hallux. Hyperpigmentation due to gout. Skin: No Porokeratosis. No infection or ulcers  Diagnosis:  Onychomycosis, , Pain in right toe, pain in left toes  Treatment & Plan Procedures and Treatment: Consent by patient was obtained for treatment procedures. The patient understood the discussion of treatment and procedures well. All questions were answered thoroughly reviewed. Debridement of mycotic and hypertrophic toenails, 1 through 5 bilateral and clearing of subungual debris. No ulceration, no infection noted.  Return Visit-Office Procedure: Patient instructed to return to the office for a follow up visit 10 weeks  for continued evaluation and treatment.   Gardiner Barefoot DPM    Gardiner Barefoot DPM

## 2017-12-22 DIAGNOSIS — F0280 Dementia in other diseases classified elsewhere without behavioral disturbance: Secondary | ICD-10-CM | POA: Diagnosis not present

## 2017-12-22 DIAGNOSIS — R269 Unspecified abnormalities of gait and mobility: Secondary | ICD-10-CM | POA: Diagnosis not present

## 2017-12-22 DIAGNOSIS — R001 Bradycardia, unspecified: Secondary | ICD-10-CM | POA: Diagnosis not present

## 2017-12-22 DIAGNOSIS — G20 Parkinson's disease: Secondary | ICD-10-CM | POA: Diagnosis not present

## 2017-12-22 DIAGNOSIS — R531 Weakness: Secondary | ICD-10-CM | POA: Diagnosis not present

## 2017-12-22 DIAGNOSIS — I110 Hypertensive heart disease with heart failure: Secondary | ICD-10-CM | POA: Diagnosis not present

## 2017-12-23 DIAGNOSIS — G20 Parkinson's disease: Secondary | ICD-10-CM | POA: Diagnosis not present

## 2017-12-23 DIAGNOSIS — R531 Weakness: Secondary | ICD-10-CM | POA: Diagnosis not present

## 2017-12-23 DIAGNOSIS — F0280 Dementia in other diseases classified elsewhere without behavioral disturbance: Secondary | ICD-10-CM | POA: Diagnosis not present

## 2017-12-23 DIAGNOSIS — R001 Bradycardia, unspecified: Secondary | ICD-10-CM | POA: Diagnosis not present

## 2017-12-23 DIAGNOSIS — I110 Hypertensive heart disease with heart failure: Secondary | ICD-10-CM | POA: Diagnosis not present

## 2017-12-23 DIAGNOSIS — R269 Unspecified abnormalities of gait and mobility: Secondary | ICD-10-CM | POA: Diagnosis not present

## 2017-12-26 DIAGNOSIS — R269 Unspecified abnormalities of gait and mobility: Secondary | ICD-10-CM | POA: Diagnosis not present

## 2017-12-26 DIAGNOSIS — R531 Weakness: Secondary | ICD-10-CM | POA: Diagnosis not present

## 2017-12-26 DIAGNOSIS — R001 Bradycardia, unspecified: Secondary | ICD-10-CM | POA: Diagnosis not present

## 2017-12-26 DIAGNOSIS — F0280 Dementia in other diseases classified elsewhere without behavioral disturbance: Secondary | ICD-10-CM | POA: Diagnosis not present

## 2017-12-26 DIAGNOSIS — G20 Parkinson's disease: Secondary | ICD-10-CM | POA: Diagnosis not present

## 2017-12-26 DIAGNOSIS — I110 Hypertensive heart disease with heart failure: Secondary | ICD-10-CM | POA: Diagnosis not present

## 2017-12-27 DIAGNOSIS — I110 Hypertensive heart disease with heart failure: Secondary | ICD-10-CM | POA: Diagnosis not present

## 2017-12-27 DIAGNOSIS — R001 Bradycardia, unspecified: Secondary | ICD-10-CM | POA: Diagnosis not present

## 2017-12-27 DIAGNOSIS — F0280 Dementia in other diseases classified elsewhere without behavioral disturbance: Secondary | ICD-10-CM | POA: Diagnosis not present

## 2017-12-27 DIAGNOSIS — R531 Weakness: Secondary | ICD-10-CM | POA: Diagnosis not present

## 2017-12-27 DIAGNOSIS — R269 Unspecified abnormalities of gait and mobility: Secondary | ICD-10-CM | POA: Diagnosis not present

## 2017-12-27 DIAGNOSIS — G20 Parkinson's disease: Secondary | ICD-10-CM | POA: Diagnosis not present

## 2017-12-28 DIAGNOSIS — G20 Parkinson's disease: Secondary | ICD-10-CM | POA: Diagnosis not present

## 2017-12-28 DIAGNOSIS — I110 Hypertensive heart disease with heart failure: Secondary | ICD-10-CM | POA: Diagnosis not present

## 2017-12-28 DIAGNOSIS — R001 Bradycardia, unspecified: Secondary | ICD-10-CM | POA: Diagnosis not present

## 2017-12-28 DIAGNOSIS — R531 Weakness: Secondary | ICD-10-CM | POA: Diagnosis not present

## 2017-12-28 DIAGNOSIS — F0280 Dementia in other diseases classified elsewhere without behavioral disturbance: Secondary | ICD-10-CM | POA: Diagnosis not present

## 2017-12-28 DIAGNOSIS — R269 Unspecified abnormalities of gait and mobility: Secondary | ICD-10-CM | POA: Diagnosis not present

## 2017-12-29 DIAGNOSIS — F0280 Dementia in other diseases classified elsewhere without behavioral disturbance: Secondary | ICD-10-CM | POA: Diagnosis not present

## 2017-12-29 DIAGNOSIS — R269 Unspecified abnormalities of gait and mobility: Secondary | ICD-10-CM | POA: Diagnosis not present

## 2017-12-29 DIAGNOSIS — R531 Weakness: Secondary | ICD-10-CM | POA: Diagnosis not present

## 2017-12-29 DIAGNOSIS — G20 Parkinson's disease: Secondary | ICD-10-CM | POA: Diagnosis not present

## 2017-12-29 DIAGNOSIS — I110 Hypertensive heart disease with heart failure: Secondary | ICD-10-CM | POA: Diagnosis not present

## 2017-12-29 DIAGNOSIS — R001 Bradycardia, unspecified: Secondary | ICD-10-CM | POA: Diagnosis not present

## 2017-12-30 DIAGNOSIS — F0280 Dementia in other diseases classified elsewhere without behavioral disturbance: Secondary | ICD-10-CM | POA: Diagnosis not present

## 2017-12-30 DIAGNOSIS — R269 Unspecified abnormalities of gait and mobility: Secondary | ICD-10-CM | POA: Diagnosis not present

## 2017-12-30 DIAGNOSIS — R531 Weakness: Secondary | ICD-10-CM | POA: Diagnosis not present

## 2017-12-30 DIAGNOSIS — G20 Parkinson's disease: Secondary | ICD-10-CM | POA: Diagnosis not present

## 2017-12-30 DIAGNOSIS — I110 Hypertensive heart disease with heart failure: Secondary | ICD-10-CM | POA: Diagnosis not present

## 2017-12-30 DIAGNOSIS — R001 Bradycardia, unspecified: Secondary | ICD-10-CM | POA: Diagnosis not present

## 2018-01-02 DIAGNOSIS — R531 Weakness: Secondary | ICD-10-CM | POA: Diagnosis not present

## 2018-01-02 DIAGNOSIS — I110 Hypertensive heart disease with heart failure: Secondary | ICD-10-CM | POA: Diagnosis not present

## 2018-01-02 DIAGNOSIS — G20 Parkinson's disease: Secondary | ICD-10-CM | POA: Diagnosis not present

## 2018-01-02 DIAGNOSIS — F0280 Dementia in other diseases classified elsewhere without behavioral disturbance: Secondary | ICD-10-CM | POA: Diagnosis not present

## 2018-01-02 DIAGNOSIS — R269 Unspecified abnormalities of gait and mobility: Secondary | ICD-10-CM | POA: Diagnosis not present

## 2018-01-02 DIAGNOSIS — R001 Bradycardia, unspecified: Secondary | ICD-10-CM | POA: Diagnosis not present

## 2018-01-03 DIAGNOSIS — R531 Weakness: Secondary | ICD-10-CM | POA: Diagnosis not present

## 2018-01-03 DIAGNOSIS — I110 Hypertensive heart disease with heart failure: Secondary | ICD-10-CM | POA: Diagnosis not present

## 2018-01-03 DIAGNOSIS — R269 Unspecified abnormalities of gait and mobility: Secondary | ICD-10-CM | POA: Diagnosis not present

## 2018-01-03 DIAGNOSIS — F0280 Dementia in other diseases classified elsewhere without behavioral disturbance: Secondary | ICD-10-CM | POA: Diagnosis not present

## 2018-01-03 DIAGNOSIS — G20 Parkinson's disease: Secondary | ICD-10-CM | POA: Diagnosis not present

## 2018-01-03 DIAGNOSIS — R001 Bradycardia, unspecified: Secondary | ICD-10-CM | POA: Diagnosis not present

## 2018-01-04 DIAGNOSIS — R001 Bradycardia, unspecified: Secondary | ICD-10-CM | POA: Diagnosis not present

## 2018-01-04 DIAGNOSIS — I110 Hypertensive heart disease with heart failure: Secondary | ICD-10-CM | POA: Diagnosis not present

## 2018-01-04 DIAGNOSIS — F0280 Dementia in other diseases classified elsewhere without behavioral disturbance: Secondary | ICD-10-CM | POA: Diagnosis not present

## 2018-01-04 DIAGNOSIS — R531 Weakness: Secondary | ICD-10-CM | POA: Diagnosis not present

## 2018-01-04 DIAGNOSIS — R269 Unspecified abnormalities of gait and mobility: Secondary | ICD-10-CM | POA: Diagnosis not present

## 2018-01-04 DIAGNOSIS — G20 Parkinson's disease: Secondary | ICD-10-CM | POA: Diagnosis not present

## 2018-01-05 DIAGNOSIS — R269 Unspecified abnormalities of gait and mobility: Secondary | ICD-10-CM | POA: Diagnosis not present

## 2018-01-05 DIAGNOSIS — R001 Bradycardia, unspecified: Secondary | ICD-10-CM | POA: Diagnosis not present

## 2018-01-05 DIAGNOSIS — I110 Hypertensive heart disease with heart failure: Secondary | ICD-10-CM | POA: Diagnosis not present

## 2018-01-05 DIAGNOSIS — G20 Parkinson's disease: Secondary | ICD-10-CM | POA: Diagnosis not present

## 2018-01-05 DIAGNOSIS — F0280 Dementia in other diseases classified elsewhere without behavioral disturbance: Secondary | ICD-10-CM | POA: Diagnosis not present

## 2018-01-05 DIAGNOSIS — R531 Weakness: Secondary | ICD-10-CM | POA: Diagnosis not present

## 2018-01-06 DIAGNOSIS — R001 Bradycardia, unspecified: Secondary | ICD-10-CM | POA: Diagnosis not present

## 2018-01-06 DIAGNOSIS — F0280 Dementia in other diseases classified elsewhere without behavioral disturbance: Secondary | ICD-10-CM | POA: Diagnosis not present

## 2018-01-06 DIAGNOSIS — R531 Weakness: Secondary | ICD-10-CM | POA: Diagnosis not present

## 2018-01-06 DIAGNOSIS — G20 Parkinson's disease: Secondary | ICD-10-CM | POA: Diagnosis not present

## 2018-01-06 DIAGNOSIS — I110 Hypertensive heart disease with heart failure: Secondary | ICD-10-CM | POA: Diagnosis not present

## 2018-01-06 DIAGNOSIS — R269 Unspecified abnormalities of gait and mobility: Secondary | ICD-10-CM | POA: Diagnosis not present

## 2018-01-09 ENCOUNTER — Ambulatory Visit (INDEPENDENT_AMBULATORY_CARE_PROVIDER_SITE_OTHER): Payer: Medicare Other | Admitting: *Deleted

## 2018-01-09 ENCOUNTER — Ambulatory Visit (INDEPENDENT_AMBULATORY_CARE_PROVIDER_SITE_OTHER): Admitting: Pharmacist

## 2018-01-09 DIAGNOSIS — Z86711 Personal history of pulmonary embolism: Secondary | ICD-10-CM | POA: Diagnosis not present

## 2018-01-09 DIAGNOSIS — Z7902 Long term (current) use of antithrombotics/antiplatelets: Secondary | ICD-10-CM | POA: Diagnosis not present

## 2018-01-09 DIAGNOSIS — I110 Hypertensive heart disease with heart failure: Secondary | ICD-10-CM | POA: Diagnosis not present

## 2018-01-09 DIAGNOSIS — R55 Syncope and collapse: Secondary | ICD-10-CM

## 2018-01-09 DIAGNOSIS — Z7901 Long term (current) use of anticoagulants: Secondary | ICD-10-CM

## 2018-01-09 DIAGNOSIS — G20 Parkinson's disease: Secondary | ICD-10-CM | POA: Diagnosis not present

## 2018-01-09 DIAGNOSIS — F0280 Dementia in other diseases classified elsewhere without behavioral disturbance: Secondary | ICD-10-CM | POA: Diagnosis not present

## 2018-01-09 DIAGNOSIS — Z5181 Encounter for therapeutic drug level monitoring: Secondary | ICD-10-CM

## 2018-01-09 DIAGNOSIS — R531 Weakness: Secondary | ICD-10-CM | POA: Diagnosis not present

## 2018-01-09 DIAGNOSIS — R269 Unspecified abnormalities of gait and mobility: Secondary | ICD-10-CM | POA: Diagnosis not present

## 2018-01-09 DIAGNOSIS — R001 Bradycardia, unspecified: Secondary | ICD-10-CM | POA: Diagnosis not present

## 2018-01-09 LAB — POCT INR: INR: 3.6

## 2018-01-09 NOTE — Patient Instructions (Signed)
Patient's daughter instructed to give medications as defined in the Anti-coagulation Track section of this encounter.  Patient's daughter instructed to give today's dose.  Patient's daughter instructed to give one (1) tablet of his 5mg  peach-colored warfarin on Thursdays and Sundays; all other days, give only one-half (1/2) tablet of his 5mg  peach-colored warfarin tablets. Patient's daughter verbalized understanding of these instructions.

## 2018-01-09 NOTE — Progress Notes (Signed)
Carelink Summary Report / Loop Recorder 

## 2018-01-09 NOTE — Progress Notes (Signed)
Anticoagulation Management [name] [name] is a 84 y.o. male who reports to the clinic for monitoring of warfarin treatment.    Indication: PE, history of; long term current use of anticoagulant. Duration: indefinite Supervising physician: Joni Reining  Anticoagulation Clinic Visit History: Patient does not report signs/symptoms of bleeding or thromboembolism  Other recent changes: No diet, medications, lifestyle changes endorsed by the patient's daughter at this visit.  Anticoagulation Episode Summary    Current INR goal:   2.0-3.0  TTR:   73.7 % (7 y)  Next INR check:   01/30/2018  INR from last check:   3.60! (01/09/2018)  Weekly max warfarin dose:     Target end date:   Indefinite  INR check location:   Anticoagulation Clinic  Preferred lab:     Send INR reminders to:   [facility]   Indications   History of pulmonary embolism [Z86.711] Encounter for long-term use of antiplatelets/antithrombotics [Z79.02]       Comments:   History of multiple venous embolic episodes. Will continue to annual re-evaluate continued need for warfarin weighing risks vs. benefits.       Anticoagulation Care Providers    Provider Role Specialty Phone number   Bertha Stakes, MD  Internal Medicine [phone]      Allergies  Allergen Reactions  . Aricept [Donepezil Hcl] Other (See Comments)    Symptomatic Bradycardia.  . Other Other (See Comments)    Shrimp gives him gout  . Shellfish-Derived Products Other (See Comments)    Causes a flare up with Gout   Prior to Admission medications   Medication Sig Start Date End Date Taking? Authorizing Provider  acetaminophen (TYLENOL) 500 MG tablet Take 1,000 mg by mouth 2 (two) times daily.    Yes [provider]  amLODipine (NORVASC) 2.5 MG tablet Take 1 tablet (2.5 mg total) by mouth daily. 11/17/17 02/15/18 Yes Ledell Noss, MD  b complex vitamins tablet Take 1 tablet by mouth every morning.    Yes [provider]  Ca  Phosphate-Cholecalciferol (CALCIUM/VITAMIN D3 GUMMIES) 250-350 MG-UNIT CHEW Chew 1 Dose by mouth 2 (two) times daily. 07/08/16  Yes [name], DO  carbidopa-levodopa (SINEMET IR) 25-100 MG tablet Take 1 tablet by mouth 4 (four) times daily. 10/28/17  Yes [name], DO  fish oil-omega-3 fatty acids 1000 MG capsule Take 2 g by mouth every morning.    Yes [provider]  memantine (NAMENDA XR) 28 MG CP24 24 hr capsule Take 1 capsule (28 mg total) by mouth every morning. 04/25/17  Yes Garvin Fila, MD  Multiple Vitamins-Minerals (MULTIVITAMIN WITH MINERALS) tablet Take 1 tablet by mouth every morning.    Yes [provider]  polyethylene glycol (MIRALAX / GLYCOLAX) packet Take 17 g by mouth daily as needed (constipation).   Yes [provider]  potassium chloride SA (KLOR-CON M20) 20 MEQ tablet Take 2 tablets (40 mEq total) by mouth daily. 09/29/17  Yes [name], DO  QUEtiapine (SEROQUEL) 25 MG tablet Take 1 tablet (25 mg total) by mouth at bedtime. May use half a tablet 12.5 mg during the daytime as needed for agitation/hallucinations Patient taking differently: Take 12.5 mg by mouth as needed (sleep). May use half a tablet 12.5 mg during the daytime as needed for agitation/hallucinations 04/05/17  Yes Garvin Fila, MD  senna (SENOKOT) 8.6 MG TABS tablet Take 1 tablet (8.6 mg total) by mouth daily as needed for mild constipation. 07/13/16  Yes Velna Ochs, MD  vitamin C (ASCORBIC ACID) 500 MG tablet Take 1,000 mg by mouth every morning.    Yes [provider]  warfarin (COUMADIN) 5 MG tablet Take 0.5-1 tablets (2.5-5 mg total) by mouth See admin instructions. Take 1 tablet on Mon, and Fri. Take 1/2 tablet all other days Patient taking differently: Take 2.5-5 mg by mouth See admin instructions. 5mg  Mon, Wed - 2.5mg  all other days 05/26/17  Yes [name], DO   Past Medical History:  Diagnosis Date  . Anxiety   . Coronary artery disease,  non-occlusive    with history of MI; Cath 2008 w multivessel nonobstructive CAD  . Dementia   . Depression   . GERD (gastroesophageal reflux disease)   . Hyperlipidemia   . Hypertension   . Memory loss   . Parkinson disease ([place])   . PE (pulmonary embolism)    unprovoked PE completed 6 months of warfarin, warfarin d/ced 02/12/2010, repeat PE 07/10/2010 after a long car ride and now on lifelong coumadin.  . Pituitary microadenoma ([place])    incidental finding CT 12/2009  . Prostate cancer ([place])   . Scoliosis   . Sigmoid volvulus ([place]) 08/02/2016  . Volvulus of colon ([place]) 06/01/2017   Social History   Socioeconomic History  . Marital status: Married    Spouse name: Not on file  . Number of children: 4  . Years of education: master's  . Highest education level: Not on file  Occupational History  . Occupation: Retired    Fish farm manager: RETIRED  Social Needs  . Financial resource strain: Not on file  . Food insecurity:    Worry: Not on file    Inability: Not on file  . Transportation needs:    Medical: Not on file    Non-medical: Not on file  Tobacco Use  . Smoking status: Never Smoker  . Smokeless tobacco: Never Used  Substance and Sexual Activity  . Alcohol use: No    Alcohol/week: 0.0 oz  . Drug use: No  . Sexual activity: Never  Lifestyle  . Physical activity:    Days per week: Not on file    Minutes per session: Not on file  . Stress: Not on file  Relationships  . Social connections:    Talks on phone: Not on file    Gets together: Not on file    Attends religious service: Not on file    Active member of club or organization: Not on file    Attends meetings of clubs or organizations: Not on file    Relationship status: Not on file  Other Topics Concern  . Not on file  Social History Narrative   Lives with daughter. Wife also has severe dementia.    Drinks 1 cup of coffee a day    Family History  Problem Relation Age of Onset  . Hypertension Father         Passed away from cerebral hemorrhage at age of 34.  [name] Dementia Mother   . Hypertension Child        4 adult children  . Cervical cancer Other   . Lung cancer Other   . Stroke Other   . Heart attack Neg Hx     ASSESSMENT Recent Results: The most recent result is correlated with 25 mg per week: Lab Results  Component Value Date   INR 3.60 01/09/2018   INR 2.40 12/05/2017   INR 3.08 11/13/2017    Anticoagulation Dosing: Description   Give one (  1) tablet of his 5mg  peach-colored warfarin on Thursdays and Sundays; all other days, give only one-half (1/2) tablet of his 5mg  peach-colored warfarin tablets.     INR today: Supratherapeutic  PLAN Weekly dose was decreased by 10% to 22.5 mg per week  Patient Instructions  Patient's daughter instructed to give medications as defined in the Anti-coagulation Track section of this encounter.  Patient's daughter instructed to give today's dose.  Patient's daughter instructed to give one (1) tablet of his 5mg  peach-colored warfarin on Thursdays and Sundays; all other days, give only one-half (1/2) tablet of his 5mg  peach-colored warfarin tablets. Patient's daughter verbalized understanding of these instructions.     Patient advised to contact clinic or seek medical attention if signs/symptoms of bleeding or thromboembolism occur.  Patient verbalized understanding by repeating back information and was advised to contact me if further medication-related questions arise. Patient was also provided an information handout.  Follow-up Return in 3 weeks (on 01/30/2018) for Follow up INR at 3:45PM.  Pennie Banter, PharmD, CACP, CPP  15 minutes spent face-to-face with the patient during the encounter. 50% of time spent on education. 50% of time was spent on fingerstick point of care INR sample collection, processing, results determination, dose adjustment and documentation in [email or web address].

## 2018-01-10 DIAGNOSIS — G20 Parkinson's disease: Secondary | ICD-10-CM | POA: Diagnosis not present

## 2018-01-10 DIAGNOSIS — R531 Weakness: Secondary | ICD-10-CM | POA: Diagnosis not present

## 2018-01-10 DIAGNOSIS — R001 Bradycardia, unspecified: Secondary | ICD-10-CM | POA: Diagnosis not present

## 2018-01-10 DIAGNOSIS — I110 Hypertensive heart disease with heart failure: Secondary | ICD-10-CM | POA: Diagnosis not present

## 2018-01-10 DIAGNOSIS — F0280 Dementia in other diseases classified elsewhere without behavioral disturbance: Secondary | ICD-10-CM | POA: Diagnosis not present

## 2018-01-10 DIAGNOSIS — R269 Unspecified abnormalities of gait and mobility: Secondary | ICD-10-CM | POA: Diagnosis not present

## 2018-01-10 NOTE — Progress Notes (Signed)
INTERNAL MEDICINE TEACHING ATTENDING ADDENDUM - [name], DO Duration- indefinate, Indication- VTE, INR- supratheraputic Lab Results  Component Value Date   INR 3.60 01/09/2018  . Agree with pharmacy recommendations as outlined in their note.

## 2018-01-11 DIAGNOSIS — R001 Bradycardia, unspecified: Secondary | ICD-10-CM | POA: Diagnosis not present

## 2018-01-11 DIAGNOSIS — G20 Parkinson's disease: Secondary | ICD-10-CM | POA: Diagnosis not present

## 2018-01-11 DIAGNOSIS — I110 Hypertensive heart disease with heart failure: Secondary | ICD-10-CM | POA: Diagnosis not present

## 2018-01-11 DIAGNOSIS — R269 Unspecified abnormalities of gait and mobility: Secondary | ICD-10-CM | POA: Diagnosis not present

## 2018-01-11 DIAGNOSIS — R531 Weakness: Secondary | ICD-10-CM | POA: Diagnosis not present

## 2018-01-11 DIAGNOSIS — F0280 Dementia in other diseases classified elsewhere without behavioral disturbance: Secondary | ICD-10-CM | POA: Diagnosis not present

## 2018-01-12 DIAGNOSIS — F0280 Dementia in other diseases classified elsewhere without behavioral disturbance: Secondary | ICD-10-CM | POA: Diagnosis not present

## 2018-01-12 DIAGNOSIS — G20 Parkinson's disease: Secondary | ICD-10-CM | POA: Diagnosis not present

## 2018-01-12 DIAGNOSIS — R269 Unspecified abnormalities of gait and mobility: Secondary | ICD-10-CM | POA: Diagnosis not present

## 2018-01-12 DIAGNOSIS — R001 Bradycardia, unspecified: Secondary | ICD-10-CM | POA: Diagnosis not present

## 2018-01-12 DIAGNOSIS — R531 Weakness: Secondary | ICD-10-CM | POA: Diagnosis not present

## 2018-01-12 DIAGNOSIS — I110 Hypertensive heart disease with heart failure: Secondary | ICD-10-CM | POA: Diagnosis not present

## 2018-01-13 DIAGNOSIS — R531 Weakness: Secondary | ICD-10-CM | POA: Diagnosis not present

## 2018-01-13 DIAGNOSIS — F0280 Dementia in other diseases classified elsewhere without behavioral disturbance: Secondary | ICD-10-CM | POA: Diagnosis not present

## 2018-01-13 DIAGNOSIS — G20 Parkinson's disease: Secondary | ICD-10-CM | POA: Diagnosis not present

## 2018-01-13 DIAGNOSIS — I110 Hypertensive heart disease with heart failure: Secondary | ICD-10-CM | POA: Diagnosis not present

## 2018-01-13 DIAGNOSIS — R001 Bradycardia, unspecified: Secondary | ICD-10-CM | POA: Diagnosis not present

## 2018-01-13 DIAGNOSIS — R269 Unspecified abnormalities of gait and mobility: Secondary | ICD-10-CM | POA: Diagnosis not present

## 2018-01-13 LAB — CUP PACEART REMOTE DEVICE CHECK
Implantable Pulse Generator Implant Date: 20160328
MDC IDC SESS DTM: 20190318180606

## 2018-01-16 DIAGNOSIS — R531 Weakness: Secondary | ICD-10-CM | POA: Diagnosis not present

## 2018-01-16 DIAGNOSIS — I110 Hypertensive heart disease with heart failure: Secondary | ICD-10-CM | POA: Diagnosis not present

## 2018-01-16 DIAGNOSIS — R001 Bradycardia, unspecified: Secondary | ICD-10-CM | POA: Diagnosis not present

## 2018-01-16 DIAGNOSIS — G20 Parkinson's disease: Secondary | ICD-10-CM | POA: Diagnosis not present

## 2018-01-16 DIAGNOSIS — R269 Unspecified abnormalities of gait and mobility: Secondary | ICD-10-CM | POA: Diagnosis not present

## 2018-01-16 DIAGNOSIS — F0280 Dementia in other diseases classified elsewhere without behavioral disturbance: Secondary | ICD-10-CM | POA: Diagnosis not present

## 2018-01-17 DIAGNOSIS — R269 Unspecified abnormalities of gait and mobility: Secondary | ICD-10-CM | POA: Diagnosis not present

## 2018-01-17 DIAGNOSIS — R001 Bradycardia, unspecified: Secondary | ICD-10-CM | POA: Diagnosis not present

## 2018-01-17 DIAGNOSIS — I110 Hypertensive heart disease with heart failure: Secondary | ICD-10-CM | POA: Diagnosis not present

## 2018-01-17 DIAGNOSIS — R531 Weakness: Secondary | ICD-10-CM | POA: Diagnosis not present

## 2018-01-17 DIAGNOSIS — F0280 Dementia in other diseases classified elsewhere without behavioral disturbance: Secondary | ICD-10-CM | POA: Diagnosis not present

## 2018-01-17 DIAGNOSIS — G20 Parkinson's disease: Secondary | ICD-10-CM | POA: Diagnosis not present

## 2018-01-18 DIAGNOSIS — Z515 Encounter for palliative care: Secondary | ICD-10-CM | POA: Diagnosis not present

## 2018-01-18 DIAGNOSIS — R32 Unspecified urinary incontinence: Secondary | ICD-10-CM | POA: Diagnosis not present

## 2018-01-18 DIAGNOSIS — G20 Parkinson's disease: Secondary | ICD-10-CM | POA: Diagnosis not present

## 2018-01-18 DIAGNOSIS — F0280 Dementia in other diseases classified elsewhere without behavioral disturbance: Secondary | ICD-10-CM | POA: Diagnosis not present

## 2018-01-18 DIAGNOSIS — R269 Unspecified abnormalities of gait and mobility: Secondary | ICD-10-CM | POA: Diagnosis not present

## 2018-01-18 DIAGNOSIS — R001 Bradycardia, unspecified: Secondary | ICD-10-CM | POA: Diagnosis not present

## 2018-01-18 DIAGNOSIS — M1A00X0 Idiopathic chronic gout, unspecified site, without tophus (tophi): Secondary | ICD-10-CM | POA: Diagnosis not present

## 2018-01-18 DIAGNOSIS — I110 Hypertensive heart disease with heart failure: Secondary | ICD-10-CM | POA: Diagnosis not present

## 2018-01-18 DIAGNOSIS — R159 Full incontinence of feces: Secondary | ICD-10-CM | POA: Diagnosis not present

## 2018-01-18 DIAGNOSIS — R531 Weakness: Secondary | ICD-10-CM | POA: Diagnosis not present

## 2018-01-19 DIAGNOSIS — G20 Parkinson's disease: Secondary | ICD-10-CM | POA: Diagnosis not present

## 2018-01-19 DIAGNOSIS — R269 Unspecified abnormalities of gait and mobility: Secondary | ICD-10-CM | POA: Diagnosis not present

## 2018-01-19 DIAGNOSIS — F0280 Dementia in other diseases classified elsewhere without behavioral disturbance: Secondary | ICD-10-CM | POA: Diagnosis not present

## 2018-01-19 DIAGNOSIS — R001 Bradycardia, unspecified: Secondary | ICD-10-CM | POA: Diagnosis not present

## 2018-01-19 DIAGNOSIS — R531 Weakness: Secondary | ICD-10-CM | POA: Diagnosis not present

## 2018-01-19 DIAGNOSIS — I110 Hypertensive heart disease with heart failure: Secondary | ICD-10-CM | POA: Diagnosis not present

## 2018-01-20 DIAGNOSIS — I110 Hypertensive heart disease with heart failure: Secondary | ICD-10-CM | POA: Diagnosis not present

## 2018-01-20 DIAGNOSIS — R001 Bradycardia, unspecified: Secondary | ICD-10-CM | POA: Diagnosis not present

## 2018-01-20 DIAGNOSIS — G20 Parkinson's disease: Secondary | ICD-10-CM | POA: Diagnosis not present

## 2018-01-20 DIAGNOSIS — R269 Unspecified abnormalities of gait and mobility: Secondary | ICD-10-CM | POA: Diagnosis not present

## 2018-01-20 DIAGNOSIS — R531 Weakness: Secondary | ICD-10-CM | POA: Diagnosis not present

## 2018-01-20 DIAGNOSIS — F0280 Dementia in other diseases classified elsewhere without behavioral disturbance: Secondary | ICD-10-CM | POA: Diagnosis not present

## 2018-01-23 DIAGNOSIS — R531 Weakness: Secondary | ICD-10-CM | POA: Diagnosis not present

## 2018-01-23 DIAGNOSIS — G20 Parkinson's disease: Secondary | ICD-10-CM | POA: Diagnosis not present

## 2018-01-23 DIAGNOSIS — R269 Unspecified abnormalities of gait and mobility: Secondary | ICD-10-CM | POA: Diagnosis not present

## 2018-01-23 DIAGNOSIS — F0280 Dementia in other diseases classified elsewhere without behavioral disturbance: Secondary | ICD-10-CM | POA: Diagnosis not present

## 2018-01-23 DIAGNOSIS — I110 Hypertensive heart disease with heart failure: Secondary | ICD-10-CM | POA: Diagnosis not present

## 2018-01-23 DIAGNOSIS — R001 Bradycardia, unspecified: Secondary | ICD-10-CM | POA: Diagnosis not present

## 2018-01-24 DIAGNOSIS — R001 Bradycardia, unspecified: Secondary | ICD-10-CM | POA: Diagnosis not present

## 2018-01-24 DIAGNOSIS — R269 Unspecified abnormalities of gait and mobility: Secondary | ICD-10-CM | POA: Diagnosis not present

## 2018-01-24 DIAGNOSIS — R531 Weakness: Secondary | ICD-10-CM | POA: Diagnosis not present

## 2018-01-24 DIAGNOSIS — F0280 Dementia in other diseases classified elsewhere without behavioral disturbance: Secondary | ICD-10-CM | POA: Diagnosis not present

## 2018-01-24 DIAGNOSIS — I110 Hypertensive heart disease with heart failure: Secondary | ICD-10-CM | POA: Diagnosis not present

## 2018-01-24 DIAGNOSIS — G20 Parkinson's disease: Secondary | ICD-10-CM | POA: Diagnosis not present

## 2018-01-25 DIAGNOSIS — F0280 Dementia in other diseases classified elsewhere without behavioral disturbance: Secondary | ICD-10-CM | POA: Diagnosis not present

## 2018-01-25 DIAGNOSIS — G20 Parkinson's disease: Secondary | ICD-10-CM | POA: Diagnosis not present

## 2018-01-25 DIAGNOSIS — R269 Unspecified abnormalities of gait and mobility: Secondary | ICD-10-CM | POA: Diagnosis not present

## 2018-01-25 DIAGNOSIS — R531 Weakness: Secondary | ICD-10-CM | POA: Diagnosis not present

## 2018-01-25 DIAGNOSIS — R001 Bradycardia, unspecified: Secondary | ICD-10-CM | POA: Diagnosis not present

## 2018-01-25 DIAGNOSIS — I110 Hypertensive heart disease with heart failure: Secondary | ICD-10-CM | POA: Diagnosis not present

## 2018-01-26 DIAGNOSIS — R001 Bradycardia, unspecified: Secondary | ICD-10-CM | POA: Diagnosis not present

## 2018-01-26 DIAGNOSIS — G20 Parkinson's disease: Secondary | ICD-10-CM | POA: Diagnosis not present

## 2018-01-26 DIAGNOSIS — I110 Hypertensive heart disease with heart failure: Secondary | ICD-10-CM | POA: Diagnosis not present

## 2018-01-26 DIAGNOSIS — R269 Unspecified abnormalities of gait and mobility: Secondary | ICD-10-CM | POA: Diagnosis not present

## 2018-01-26 DIAGNOSIS — F0280 Dementia in other diseases classified elsewhere without behavioral disturbance: Secondary | ICD-10-CM | POA: Diagnosis not present

## 2018-01-26 DIAGNOSIS — R531 Weakness: Secondary | ICD-10-CM | POA: Diagnosis not present

## 2018-01-27 DIAGNOSIS — F0280 Dementia in other diseases classified elsewhere without behavioral disturbance: Secondary | ICD-10-CM | POA: Diagnosis not present

## 2018-01-27 DIAGNOSIS — G20 Parkinson's disease: Secondary | ICD-10-CM | POA: Diagnosis not present

## 2018-01-27 DIAGNOSIS — R001 Bradycardia, unspecified: Secondary | ICD-10-CM | POA: Diagnosis not present

## 2018-01-27 DIAGNOSIS — R269 Unspecified abnormalities of gait and mobility: Secondary | ICD-10-CM | POA: Diagnosis not present

## 2018-01-27 DIAGNOSIS — I110 Hypertensive heart disease with heart failure: Secondary | ICD-10-CM | POA: Diagnosis not present

## 2018-01-27 DIAGNOSIS — R531 Weakness: Secondary | ICD-10-CM | POA: Diagnosis not present

## 2018-01-30 ENCOUNTER — Ambulatory Visit (INDEPENDENT_AMBULATORY_CARE_PROVIDER_SITE_OTHER): Admitting: Pharmacist

## 2018-01-30 DIAGNOSIS — R269 Unspecified abnormalities of gait and mobility: Secondary | ICD-10-CM | POA: Diagnosis not present

## 2018-01-30 DIAGNOSIS — Z7902 Long term (current) use of antithrombotics/antiplatelets: Secondary | ICD-10-CM | POA: Diagnosis not present

## 2018-01-30 DIAGNOSIS — G20 Parkinson's disease: Secondary | ICD-10-CM | POA: Diagnosis not present

## 2018-01-30 DIAGNOSIS — R531 Weakness: Secondary | ICD-10-CM | POA: Diagnosis not present

## 2018-01-30 DIAGNOSIS — Z86711 Personal history of pulmonary embolism: Secondary | ICD-10-CM | POA: Diagnosis not present

## 2018-01-30 DIAGNOSIS — R001 Bradycardia, unspecified: Secondary | ICD-10-CM | POA: Diagnosis not present

## 2018-01-30 DIAGNOSIS — Z5181 Encounter for therapeutic drug level monitoring: Secondary | ICD-10-CM | POA: Diagnosis not present

## 2018-01-30 DIAGNOSIS — I110 Hypertensive heart disease with heart failure: Secondary | ICD-10-CM | POA: Diagnosis not present

## 2018-01-30 DIAGNOSIS — F0280 Dementia in other diseases classified elsewhere without behavioral disturbance: Secondary | ICD-10-CM | POA: Diagnosis not present

## 2018-01-30 DIAGNOSIS — Z7901 Long term (current) use of anticoagulants: Secondary | ICD-10-CM | POA: Diagnosis not present

## 2018-01-30 LAB — POCT INR: INR: 2.5

## 2018-01-30 NOTE — Patient Instructions (Signed)
Patient instructed to take medications as defined in the Anti-coagulation Track section of this encounter.  Patient instructed to take today's dose.  Patient instructed to take  one (1) tablet of his 5mg  peach-colored warfarin on Thursdays and Sundays; all other days, give only one-half (1/2) tablet of his 5mg  peach-colored warfarin tablets. Patient verbalized understanding of these instructions.

## 2018-01-30 NOTE — Progress Notes (Signed)
Anticoagulation Management [name] [name] is a 84 y.o. male who reports to the clinic for monitoring of warfarin treatment.    Indication: PE, history of; history of multiple venous embolic episodes; encounter for long-term use of antiplatelets/antithrombotics.   Duration: indefinite Supervising physician: Aldine Contes  Anticoagulation Clinic Visit History: Patient does not report signs/symptoms of bleeding or thromboembolism  Other recent changes: No diet, medications, lifestyle changes.  Anticoagulation Episode Summary    Current INR goal:   2.0-3.0  TTR:   73.5 % (7 y)  Next INR check:   02/27/2018  INR from last check:   2.50 (01/30/2018)  Weekly max warfarin dose:     Target end date:   Indefinite  INR check location:   Anticoagulation Clinic  Preferred lab:     Send INR reminders to:   [facility]   Indications   History of pulmonary embolism [Z86.711] Encounter for long-term use of antiplatelets/antithrombotics [Z79.02]       Comments:   History of multiple venous embolic episodes. Will continue to annual re-evaluate continued need for warfarin weighing risks vs. benefits.       Anticoagulation Care Providers    Provider Role Specialty Phone number   Bertha Stakes, MD  Internal Medicine [phone]      Allergies  Allergen Reactions  . Aricept [Donepezil Hcl] Other (See Comments)    Symptomatic Bradycardia.  . Other Other (See Comments)    Shrimp gives him gout  . Shellfish-Derived Products Other (See Comments)    Causes a flare up with Gout   Prior to Admission medications   Medication Sig Start Date End Date Taking? Authorizing Provider  acetaminophen (TYLENOL) 500 MG tablet Take 1,000 mg by mouth 2 (two) times daily.    Yes [provider]  amLODipine (NORVASC) 2.5 MG tablet Take 1 tablet (2.5 mg total) by mouth daily. 11/17/17 02/15/18 Yes Ledell Noss, MD  b complex vitamins tablet Take 1 tablet by mouth every morning.    Yes [provider]  Ca Phosphate-Cholecalciferol (CALCIUM/VITAMIN D3 GUMMIES) 250-350 MG-UNIT CHEW Chew 1 Dose by mouth 2 (two) times daily. 07/08/16  Yes [name], DO  carbidopa-levodopa (SINEMET IR) 25-100 MG tablet Take 1 tablet by mouth 4 (four) times daily. 10/28/17  Yes [name], DO  fish oil-omega-3 fatty acids 1000 MG capsule Take 2 g by mouth every morning.    Yes [provider]  memantine (NAMENDA XR) 28 MG CP24 24 hr capsule Take 1 capsule (28 mg total) by mouth every morning. 04/25/17  Yes Garvin Fila, MD  Multiple Vitamins-Minerals (MULTIVITAMIN WITH MINERALS) tablet Take 1 tablet by mouth every morning.    Yes [provider]  polyethylene glycol (MIRALAX / GLYCOLAX) packet Take 17 g by mouth daily as needed (constipation).   Yes [provider]  potassium chloride SA (KLOR-CON M20) 20 MEQ tablet Take 2 tablets (40 mEq total) by mouth daily. 09/29/17  Yes [name], DO  QUEtiapine (SEROQUEL) 25 MG tablet Take 1 tablet (25 mg total) by mouth at bedtime. May use half a tablet 12.5 mg during the daytime as needed for agitation/hallucinations Patient taking differently: Take 12.5 mg by mouth as needed (sleep). May use half a tablet 12.5 mg during the daytime as needed for agitation/hallucinations 04/05/17  Yes Garvin Fila, MD  senna (SENOKOT) 8.6 MG TABS tablet Take 1 tablet (8.6 mg total) by mouth daily as needed for mild constipation. 07/13/16  Yes Velna Ochs, MD  vitamin C (ASCORBIC ACID) 500 MG tablet Take 1,000 mg by mouth every morning.    Yes [provider]  warfarin (COUMADIN) 5 MG tablet Take 0.5-1 tablets (2.5-5 mg total) by mouth See admin instructions. Take 1 tablet on Mon, and Fri. Take 1/2 tablet all other days Patient taking differently: Take 2.5-5 mg by mouth See admin instructions. 5mg  Mon, Wed - 2.5mg  all other days 05/26/17  Yes [name], DO   Past Medical History:  Diagnosis Date  . Anxiety   .  Coronary artery disease, non-occlusive    with history of MI; Cath 2008 w multivessel nonobstructive CAD  . Dementia   . Depression   . GERD (gastroesophageal reflux disease)   . Hyperlipidemia   . Hypertension   . Memory loss   . Parkinson disease ([place])   . PE (pulmonary embolism)    unprovoked PE completed 6 months of warfarin, warfarin d/ced 02/12/2010, repeat PE 07/10/2010 after a long car ride and now on lifelong coumadin.  . Pituitary microadenoma ([place])    incidental finding CT 12/2009  . Prostate cancer ([place])   . Scoliosis   . Sigmoid volvulus ([place]) 08/02/2016  . Volvulus of colon ([place]) 06/01/2017   Social History   Socioeconomic History  . Marital status: Married    Spouse name: Not on file  . Number of children: 4  . Years of education: master's  . Highest education level: Not on file  Occupational History  . Occupation: Retired    Fish farm manager: RETIRED  Social Needs  . Financial resource strain: Not on file  . Food insecurity:    Worry: Not on file    Inability: Not on file  . Transportation needs:    Medical: Not on file    Non-medical: Not on file  Tobacco Use  . Smoking status: Never Smoker  . Smokeless tobacco: Never Used  Substance and Sexual Activity  . Alcohol use: No    Alcohol/week: 0.0 oz  . Drug use: No  . Sexual activity: Never  Lifestyle  . Physical activity:    Days per week: Not on file    Minutes per session: Not on file  . Stress: Not on file  Relationships  . Social connections:    Talks on phone: Not on file    Gets together: Not on file    Attends religious service: Not on file    Active member of club or organization: Not on file    Attends meetings of clubs or organizations: Not on file    Relationship status: Not on file  Other Topics Concern  . Not on file  Social History Narrative   Lives with daughter. Wife also has severe dementia.    Drinks 1 cup of coffee a day    Family History  Problem Relation Age of Onset  .  Hypertension Father        Passed away from cerebral hemorrhage at age of 70.  [name] Dementia Mother   . Hypertension Child        4 adult children  . Cervical cancer Other   . Lung cancer Other   . Stroke Other   . Heart attack Neg Hx     ASSESSMENT Recent Results: The most recent result is correlated with 22.5 mg per week: Lab Results  Component Value Date   INR 2.50 01/30/2018   INR 3.60 01/09/2018   INR 2.40 12/05/2017    Anticoagulation Dosing: Description   Give one (  1) tablet of his 5mg  peach-colored warfarin on Thursdays and Sundays; all other days, give only one-half (1/2) tablet of his 5mg  peach-colored warfarin tablets.     INR today: Therapeutic  PLAN Weekly dose was unchanged.   Patient Instructions  Patient instructed to take medications as defined in the Anti-coagulation Track section of this encounter.  Patient instructed to take today's dose.  Patient instructed to take  one (1) tablet of his 5mg  peach-colored warfarin on Thursdays and Sundays; all other days, give only one-half (1/2) tablet of his 5mg  peach-colored warfarin tablets. Patient verbalized understanding of these instructions.     Patient advised to contact clinic or seek medical attention if signs/symptoms of bleeding or thromboembolism occur.  Patient verbalized understanding by repeating back information and was advised to contact me if further medication-related questions arise. Patient was also provided an information handout.  Follow-up Return in 1 month (on 02/27/2018) for Follow up INR at [place], PharmD, CACP, CPP  15 minutes spent face-to-face with the patient during the encounter. 50% of time spent on education. 50% of time was spent on fingerstick point of care INR sample collection, processing, results determination and documentation in [email or web address].

## 2018-01-30 NOTE — Progress Notes (Signed)
INTERNAL MEDICINE TEACHING ATTENDING ADDENDUM - Briann Sarchet M.D  Duration- indefinite, Indication- recurrent PE, INR- therapeutic. Agree with pharmacy recommendations as outlined in their note.      

## 2018-01-31 ENCOUNTER — Telehealth: Payer: Self-pay | Admitting: *Deleted

## 2018-01-31 DIAGNOSIS — F0280 Dementia in other diseases classified elsewhere without behavioral disturbance: Secondary | ICD-10-CM | POA: Diagnosis not present

## 2018-01-31 DIAGNOSIS — R001 Bradycardia, unspecified: Secondary | ICD-10-CM | POA: Diagnosis not present

## 2018-01-31 DIAGNOSIS — R531 Weakness: Secondary | ICD-10-CM | POA: Diagnosis not present

## 2018-01-31 DIAGNOSIS — I110 Hypertensive heart disease with heart failure: Secondary | ICD-10-CM | POA: Diagnosis not present

## 2018-01-31 DIAGNOSIS — R269 Unspecified abnormalities of gait and mobility: Secondary | ICD-10-CM | POA: Diagnosis not present

## 2018-01-31 DIAGNOSIS — G20 Parkinson's disease: Secondary | ICD-10-CM | POA: Diagnosis not present

## 2018-01-31 NOTE — Telephone Encounter (Signed)
LMOVM for [name] [facility]) requesting call back to the [facility], direct number given.  LINQ at [facility] as of 01/28/18.

## 2018-01-31 NOTE — Telephone Encounter (Signed)
Spoke with andrea per DPR regarding patient's ILR being at [facility]. OV with SK was scheduled for 6/28 to discuss options. [name] was hesitant to return home monitor - requesting that I not order a home monitor kit at this time. I verbalized understanding.

## 2018-02-01 DIAGNOSIS — G20 Parkinson's disease: Secondary | ICD-10-CM | POA: Diagnosis not present

## 2018-02-01 DIAGNOSIS — R269 Unspecified abnormalities of gait and mobility: Secondary | ICD-10-CM | POA: Diagnosis not present

## 2018-02-01 DIAGNOSIS — F0280 Dementia in other diseases classified elsewhere without behavioral disturbance: Secondary | ICD-10-CM | POA: Diagnosis not present

## 2018-02-01 DIAGNOSIS — I110 Hypertensive heart disease with heart failure: Secondary | ICD-10-CM | POA: Diagnosis not present

## 2018-02-01 DIAGNOSIS — R001 Bradycardia, unspecified: Secondary | ICD-10-CM | POA: Diagnosis not present

## 2018-02-01 DIAGNOSIS — R531 Weakness: Secondary | ICD-10-CM | POA: Diagnosis not present

## 2018-02-02 DIAGNOSIS — G20 Parkinson's disease: Secondary | ICD-10-CM | POA: Diagnosis not present

## 2018-02-02 DIAGNOSIS — R001 Bradycardia, unspecified: Secondary | ICD-10-CM | POA: Diagnosis not present

## 2018-02-02 DIAGNOSIS — R269 Unspecified abnormalities of gait and mobility: Secondary | ICD-10-CM | POA: Diagnosis not present

## 2018-02-02 DIAGNOSIS — F0280 Dementia in other diseases classified elsewhere without behavioral disturbance: Secondary | ICD-10-CM | POA: Diagnosis not present

## 2018-02-02 DIAGNOSIS — I110 Hypertensive heart disease with heart failure: Secondary | ICD-10-CM | POA: Diagnosis not present

## 2018-02-02 DIAGNOSIS — R531 Weakness: Secondary | ICD-10-CM | POA: Diagnosis not present

## 2018-02-03 DIAGNOSIS — R269 Unspecified abnormalities of gait and mobility: Secondary | ICD-10-CM | POA: Diagnosis not present

## 2018-02-03 DIAGNOSIS — I110 Hypertensive heart disease with heart failure: Secondary | ICD-10-CM | POA: Diagnosis not present

## 2018-02-03 DIAGNOSIS — F0280 Dementia in other diseases classified elsewhere without behavioral disturbance: Secondary | ICD-10-CM | POA: Diagnosis not present

## 2018-02-03 DIAGNOSIS — G20 Parkinson's disease: Secondary | ICD-10-CM | POA: Diagnosis not present

## 2018-02-03 DIAGNOSIS — R001 Bradycardia, unspecified: Secondary | ICD-10-CM | POA: Diagnosis not present

## 2018-02-03 DIAGNOSIS — R531 Weakness: Secondary | ICD-10-CM | POA: Diagnosis not present

## 2018-02-06 DIAGNOSIS — R269 Unspecified abnormalities of gait and mobility: Secondary | ICD-10-CM | POA: Diagnosis not present

## 2018-02-06 DIAGNOSIS — F0280 Dementia in other diseases classified elsewhere without behavioral disturbance: Secondary | ICD-10-CM | POA: Diagnosis not present

## 2018-02-06 DIAGNOSIS — R531 Weakness: Secondary | ICD-10-CM | POA: Diagnosis not present

## 2018-02-06 DIAGNOSIS — G20 Parkinson's disease: Secondary | ICD-10-CM | POA: Diagnosis not present

## 2018-02-06 DIAGNOSIS — I110 Hypertensive heart disease with heart failure: Secondary | ICD-10-CM | POA: Diagnosis not present

## 2018-02-06 DIAGNOSIS — R001 Bradycardia, unspecified: Secondary | ICD-10-CM | POA: Diagnosis not present

## 2018-02-06 LAB — CUP PACEART REMOTE DEVICE CHECK
Date Time Interrogation Session: 20190420183606
Implantable Pulse Generator Implant Date: 20160328

## 2018-02-07 DIAGNOSIS — I110 Hypertensive heart disease with heart failure: Secondary | ICD-10-CM | POA: Diagnosis not present

## 2018-02-07 DIAGNOSIS — R001 Bradycardia, unspecified: Secondary | ICD-10-CM | POA: Diagnosis not present

## 2018-02-07 DIAGNOSIS — R269 Unspecified abnormalities of gait and mobility: Secondary | ICD-10-CM | POA: Diagnosis not present

## 2018-02-07 DIAGNOSIS — F0280 Dementia in other diseases classified elsewhere without behavioral disturbance: Secondary | ICD-10-CM | POA: Diagnosis not present

## 2018-02-07 DIAGNOSIS — R531 Weakness: Secondary | ICD-10-CM | POA: Diagnosis not present

## 2018-02-07 DIAGNOSIS — G20 Parkinson's disease: Secondary | ICD-10-CM | POA: Diagnosis not present

## 2018-02-08 DIAGNOSIS — R001 Bradycardia, unspecified: Secondary | ICD-10-CM | POA: Diagnosis not present

## 2018-02-08 DIAGNOSIS — F0280 Dementia in other diseases classified elsewhere without behavioral disturbance: Secondary | ICD-10-CM | POA: Diagnosis not present

## 2018-02-08 DIAGNOSIS — I110 Hypertensive heart disease with heart failure: Secondary | ICD-10-CM | POA: Diagnosis not present

## 2018-02-08 DIAGNOSIS — G20 Parkinson's disease: Secondary | ICD-10-CM | POA: Diagnosis not present

## 2018-02-08 DIAGNOSIS — R269 Unspecified abnormalities of gait and mobility: Secondary | ICD-10-CM | POA: Diagnosis not present

## 2018-02-08 DIAGNOSIS — R531 Weakness: Secondary | ICD-10-CM | POA: Diagnosis not present

## 2018-02-09 DIAGNOSIS — R269 Unspecified abnormalities of gait and mobility: Secondary | ICD-10-CM | POA: Diagnosis not present

## 2018-02-09 DIAGNOSIS — R001 Bradycardia, unspecified: Secondary | ICD-10-CM | POA: Diagnosis not present

## 2018-02-09 DIAGNOSIS — F0280 Dementia in other diseases classified elsewhere without behavioral disturbance: Secondary | ICD-10-CM | POA: Diagnosis not present

## 2018-02-09 DIAGNOSIS — R531 Weakness: Secondary | ICD-10-CM | POA: Diagnosis not present

## 2018-02-09 DIAGNOSIS — G20 Parkinson's disease: Secondary | ICD-10-CM | POA: Diagnosis not present

## 2018-02-09 DIAGNOSIS — I110 Hypertensive heart disease with heart failure: Secondary | ICD-10-CM | POA: Diagnosis not present

## 2018-02-10 DIAGNOSIS — I110 Hypertensive heart disease with heart failure: Secondary | ICD-10-CM | POA: Diagnosis not present

## 2018-02-10 DIAGNOSIS — R269 Unspecified abnormalities of gait and mobility: Secondary | ICD-10-CM | POA: Diagnosis not present

## 2018-02-10 DIAGNOSIS — F0280 Dementia in other diseases classified elsewhere without behavioral disturbance: Secondary | ICD-10-CM | POA: Diagnosis not present

## 2018-02-10 DIAGNOSIS — R531 Weakness: Secondary | ICD-10-CM | POA: Diagnosis not present

## 2018-02-10 DIAGNOSIS — G20 Parkinson's disease: Secondary | ICD-10-CM | POA: Diagnosis not present

## 2018-02-10 DIAGNOSIS — R001 Bradycardia, unspecified: Secondary | ICD-10-CM | POA: Diagnosis not present

## 2018-02-13 DIAGNOSIS — R269 Unspecified abnormalities of gait and mobility: Secondary | ICD-10-CM | POA: Diagnosis not present

## 2018-02-13 DIAGNOSIS — G20 Parkinson's disease: Secondary | ICD-10-CM | POA: Diagnosis not present

## 2018-02-13 DIAGNOSIS — R001 Bradycardia, unspecified: Secondary | ICD-10-CM | POA: Diagnosis not present

## 2018-02-13 DIAGNOSIS — I110 Hypertensive heart disease with heart failure: Secondary | ICD-10-CM | POA: Diagnosis not present

## 2018-02-13 DIAGNOSIS — F0280 Dementia in other diseases classified elsewhere without behavioral disturbance: Secondary | ICD-10-CM | POA: Diagnosis not present

## 2018-02-13 DIAGNOSIS — R531 Weakness: Secondary | ICD-10-CM | POA: Diagnosis not present

## 2018-02-14 DIAGNOSIS — R001 Bradycardia, unspecified: Secondary | ICD-10-CM | POA: Diagnosis not present

## 2018-02-14 DIAGNOSIS — F0280 Dementia in other diseases classified elsewhere without behavioral disturbance: Secondary | ICD-10-CM | POA: Diagnosis not present

## 2018-02-14 DIAGNOSIS — R531 Weakness: Secondary | ICD-10-CM | POA: Diagnosis not present

## 2018-02-14 DIAGNOSIS — R269 Unspecified abnormalities of gait and mobility: Secondary | ICD-10-CM | POA: Diagnosis not present

## 2018-02-14 DIAGNOSIS — G20 Parkinson's disease: Secondary | ICD-10-CM | POA: Diagnosis not present

## 2018-02-14 DIAGNOSIS — I110 Hypertensive heart disease with heart failure: Secondary | ICD-10-CM | POA: Diagnosis not present

## 2018-02-15 DIAGNOSIS — R269 Unspecified abnormalities of gait and mobility: Secondary | ICD-10-CM | POA: Diagnosis not present

## 2018-02-15 DIAGNOSIS — I110 Hypertensive heart disease with heart failure: Secondary | ICD-10-CM | POA: Diagnosis not present

## 2018-02-15 DIAGNOSIS — R531 Weakness: Secondary | ICD-10-CM | POA: Diagnosis not present

## 2018-02-15 DIAGNOSIS — G20 Parkinson's disease: Secondary | ICD-10-CM | POA: Diagnosis not present

## 2018-02-15 DIAGNOSIS — R001 Bradycardia, unspecified: Secondary | ICD-10-CM | POA: Diagnosis not present

## 2018-02-15 DIAGNOSIS — F0280 Dementia in other diseases classified elsewhere without behavioral disturbance: Secondary | ICD-10-CM | POA: Diagnosis not present

## 2018-02-16 DIAGNOSIS — I110 Hypertensive heart disease with heart failure: Secondary | ICD-10-CM | POA: Diagnosis not present

## 2018-02-16 DIAGNOSIS — R269 Unspecified abnormalities of gait and mobility: Secondary | ICD-10-CM | POA: Diagnosis not present

## 2018-02-16 DIAGNOSIS — G20 Parkinson's disease: Secondary | ICD-10-CM | POA: Diagnosis not present

## 2018-02-16 DIAGNOSIS — R001 Bradycardia, unspecified: Secondary | ICD-10-CM | POA: Diagnosis not present

## 2018-02-16 DIAGNOSIS — R531 Weakness: Secondary | ICD-10-CM | POA: Diagnosis not present

## 2018-02-16 DIAGNOSIS — F0280 Dementia in other diseases classified elsewhere without behavioral disturbance: Secondary | ICD-10-CM | POA: Diagnosis not present

## 2018-02-17 DIAGNOSIS — R269 Unspecified abnormalities of gait and mobility: Secondary | ICD-10-CM | POA: Diagnosis not present

## 2018-02-17 DIAGNOSIS — G20 Parkinson's disease: Secondary | ICD-10-CM | POA: Diagnosis not present

## 2018-02-17 DIAGNOSIS — R001 Bradycardia, unspecified: Secondary | ICD-10-CM | POA: Diagnosis not present

## 2018-02-17 DIAGNOSIS — R531 Weakness: Secondary | ICD-10-CM | POA: Diagnosis not present

## 2018-02-17 DIAGNOSIS — F0280 Dementia in other diseases classified elsewhere without behavioral disturbance: Secondary | ICD-10-CM | POA: Diagnosis not present

## 2018-02-17 DIAGNOSIS — I110 Hypertensive heart disease with heart failure: Secondary | ICD-10-CM | POA: Diagnosis not present

## 2018-02-18 DIAGNOSIS — Z515 Encounter for palliative care: Secondary | ICD-10-CM | POA: Diagnosis not present

## 2018-02-18 DIAGNOSIS — R32 Unspecified urinary incontinence: Secondary | ICD-10-CM | POA: Diagnosis not present

## 2018-02-18 DIAGNOSIS — G20 Parkinson's disease: Secondary | ICD-10-CM | POA: Diagnosis not present

## 2018-02-18 DIAGNOSIS — R269 Unspecified abnormalities of gait and mobility: Secondary | ICD-10-CM | POA: Diagnosis not present

## 2018-02-18 DIAGNOSIS — F0280 Dementia in other diseases classified elsewhere without behavioral disturbance: Secondary | ICD-10-CM | POA: Diagnosis not present

## 2018-02-18 DIAGNOSIS — M1A00X0 Idiopathic chronic gout, unspecified site, without tophus (tophi): Secondary | ICD-10-CM | POA: Diagnosis not present

## 2018-02-18 DIAGNOSIS — R001 Bradycardia, unspecified: Secondary | ICD-10-CM | POA: Diagnosis not present

## 2018-02-18 DIAGNOSIS — I110 Hypertensive heart disease with heart failure: Secondary | ICD-10-CM | POA: Diagnosis not present

## 2018-02-18 DIAGNOSIS — R159 Full incontinence of feces: Secondary | ICD-10-CM | POA: Diagnosis not present

## 2018-02-18 DIAGNOSIS — R531 Weakness: Secondary | ICD-10-CM | POA: Diagnosis not present

## 2018-02-20 DIAGNOSIS — I110 Hypertensive heart disease with heart failure: Secondary | ICD-10-CM | POA: Diagnosis not present

## 2018-02-20 DIAGNOSIS — F0280 Dementia in other diseases classified elsewhere without behavioral disturbance: Secondary | ICD-10-CM | POA: Diagnosis not present

## 2018-02-20 DIAGNOSIS — G20 Parkinson's disease: Secondary | ICD-10-CM | POA: Diagnosis not present

## 2018-02-20 DIAGNOSIS — R531 Weakness: Secondary | ICD-10-CM | POA: Diagnosis not present

## 2018-02-20 DIAGNOSIS — R001 Bradycardia, unspecified: Secondary | ICD-10-CM | POA: Diagnosis not present

## 2018-02-20 DIAGNOSIS — R269 Unspecified abnormalities of gait and mobility: Secondary | ICD-10-CM | POA: Diagnosis not present

## 2018-02-21 DIAGNOSIS — F0280 Dementia in other diseases classified elsewhere without behavioral disturbance: Secondary | ICD-10-CM | POA: Diagnosis not present

## 2018-02-21 DIAGNOSIS — R531 Weakness: Secondary | ICD-10-CM | POA: Diagnosis not present

## 2018-02-21 DIAGNOSIS — R269 Unspecified abnormalities of gait and mobility: Secondary | ICD-10-CM | POA: Diagnosis not present

## 2018-02-21 DIAGNOSIS — R001 Bradycardia, unspecified: Secondary | ICD-10-CM | POA: Diagnosis not present

## 2018-02-21 DIAGNOSIS — I110 Hypertensive heart disease with heart failure: Secondary | ICD-10-CM | POA: Diagnosis not present

## 2018-02-21 DIAGNOSIS — G20 Parkinson's disease: Secondary | ICD-10-CM | POA: Diagnosis not present

## 2018-02-22 DIAGNOSIS — G20 Parkinson's disease: Secondary | ICD-10-CM | POA: Diagnosis not present

## 2018-02-22 DIAGNOSIS — F0280 Dementia in other diseases classified elsewhere without behavioral disturbance: Secondary | ICD-10-CM | POA: Diagnosis not present

## 2018-02-22 DIAGNOSIS — R531 Weakness: Secondary | ICD-10-CM | POA: Diagnosis not present

## 2018-02-22 DIAGNOSIS — R001 Bradycardia, unspecified: Secondary | ICD-10-CM | POA: Diagnosis not present

## 2018-02-22 DIAGNOSIS — I110 Hypertensive heart disease with heart failure: Secondary | ICD-10-CM | POA: Diagnosis not present

## 2018-02-22 DIAGNOSIS — R269 Unspecified abnormalities of gait and mobility: Secondary | ICD-10-CM | POA: Diagnosis not present

## 2018-02-23 DIAGNOSIS — I110 Hypertensive heart disease with heart failure: Secondary | ICD-10-CM | POA: Diagnosis not present

## 2018-02-23 DIAGNOSIS — F0280 Dementia in other diseases classified elsewhere without behavioral disturbance: Secondary | ICD-10-CM | POA: Diagnosis not present

## 2018-02-23 DIAGNOSIS — R001 Bradycardia, unspecified: Secondary | ICD-10-CM | POA: Diagnosis not present

## 2018-02-23 DIAGNOSIS — R269 Unspecified abnormalities of gait and mobility: Secondary | ICD-10-CM | POA: Diagnosis not present

## 2018-02-23 DIAGNOSIS — R531 Weakness: Secondary | ICD-10-CM | POA: Diagnosis not present

## 2018-02-23 DIAGNOSIS — G20 Parkinson's disease: Secondary | ICD-10-CM | POA: Diagnosis not present

## 2018-02-24 DIAGNOSIS — R001 Bradycardia, unspecified: Secondary | ICD-10-CM | POA: Diagnosis not present

## 2018-02-24 DIAGNOSIS — R269 Unspecified abnormalities of gait and mobility: Secondary | ICD-10-CM | POA: Diagnosis not present

## 2018-02-24 DIAGNOSIS — F0280 Dementia in other diseases classified elsewhere without behavioral disturbance: Secondary | ICD-10-CM | POA: Diagnosis not present

## 2018-02-24 DIAGNOSIS — G20 Parkinson's disease: Secondary | ICD-10-CM | POA: Diagnosis not present

## 2018-02-24 DIAGNOSIS — R531 Weakness: Secondary | ICD-10-CM | POA: Diagnosis not present

## 2018-02-24 DIAGNOSIS — I110 Hypertensive heart disease with heart failure: Secondary | ICD-10-CM | POA: Diagnosis not present

## 2018-02-27 ENCOUNTER — Ambulatory Visit (INDEPENDENT_AMBULATORY_CARE_PROVIDER_SITE_OTHER): Admitting: Pharmacist

## 2018-02-27 DIAGNOSIS — Z86711 Personal history of pulmonary embolism: Secondary | ICD-10-CM

## 2018-02-27 DIAGNOSIS — Z7902 Long term (current) use of antithrombotics/antiplatelets: Secondary | ICD-10-CM

## 2018-02-27 DIAGNOSIS — Z7901 Long term (current) use of anticoagulants: Secondary | ICD-10-CM

## 2018-02-27 DIAGNOSIS — F0280 Dementia in other diseases classified elsewhere without behavioral disturbance: Secondary | ICD-10-CM | POA: Diagnosis not present

## 2018-02-27 DIAGNOSIS — R269 Unspecified abnormalities of gait and mobility: Secondary | ICD-10-CM | POA: Diagnosis not present

## 2018-02-27 DIAGNOSIS — R001 Bradycardia, unspecified: Secondary | ICD-10-CM | POA: Diagnosis not present

## 2018-02-27 DIAGNOSIS — R531 Weakness: Secondary | ICD-10-CM | POA: Diagnosis not present

## 2018-02-27 DIAGNOSIS — G20 Parkinson's disease: Secondary | ICD-10-CM | POA: Diagnosis not present

## 2018-02-27 DIAGNOSIS — I110 Hypertensive heart disease with heart failure: Secondary | ICD-10-CM | POA: Diagnosis not present

## 2018-02-27 LAB — POCT INR: INR: 2.2 (ref 2.0–3.0)

## 2018-02-27 NOTE — Patient Instructions (Signed)
Patient instructed to take medications as defined in the Anti-coagulation Track section of this encounter.  Patient instructed to take today's dose.  Patient instructed to take one (1) tablet of his 5mg  peach-colored warfarin on Thursdays and Sundays; all other days, give only one-half (1/2) tablet of his 5mg  peach-colored warfarin tablets. Patient verbalized understanding of these instructions.

## 2018-02-27 NOTE — Progress Notes (Signed)
Anticoagulation Management [name] [name] is a 84 y.o. male who reports to the clinic for monitoring of warfarin treatment.    Indication: PE, history of; History of multiple venous embolic events; long term current use of anticoagulant.  Duration: indefinite Supervising physician: Gilles Chiquito  Anticoagulation Clinic Visit History: Patient does not report signs/symptoms of bleeding or thromboembolism  Other recent changes: No diet, medications, lifestyle changes endorsed by the patient's daughter or the patient.  Anticoagulation Episode Summary    Current INR goal:   2.0-3.0  TTR:   73.7 % (7.1 y)  Next INR check:   03/27/2018  INR from last check:   2.2 (02/27/2018)  Weekly max warfarin dose:     Target end date:   Indefinite  INR check location:   Anticoagulation Clinic  Preferred lab:     Send INR reminders to:   [facility]   Indications   History of pulmonary embolism [Z86.711] Encounter for long-term use of antiplatelets/antithrombotics [Z79.02]       Comments:   History of multiple venous embolic episodes. Will continue to annual re-evaluate continued need for warfarin weighing risks vs. benefits.       Anticoagulation Care Providers    Provider Role Specialty Phone number   Bertha Stakes, MD  Internal Medicine [phone]      Allergies  Allergen Reactions  . Aricept [Donepezil Hcl] Other (See Comments)    Symptomatic Bradycardia.  . Other Other (See Comments)    Shrimp gives him gout  . Shellfish-Derived Products Other (See Comments)    Causes a flare up with Gout   Prior to Admission medications   Medication Sig Start Date End Date Taking? Authorizing Provider  acetaminophen (TYLENOL) 500 MG tablet Take 1,000 mg by mouth 2 (two) times daily.    Yes [provider]  b complex vitamins tablet Take 1 tablet by mouth every morning.    Yes [provider]  Ca Phosphate-Cholecalciferol (CALCIUM/VITAMIN D3 GUMMIES) 250-350 MG-UNIT CHEW Chew 1  Dose by mouth 2 (two) times daily. 07/08/16  Yes [name], DO  carbidopa-levodopa (SINEMET IR) 25-100 MG tablet Take 1 tablet by mouth 4 (four) times daily. 10/28/17  Yes [name], DO  fish oil-omega-3 fatty acids 1000 MG capsule Take 2 g by mouth every morning.    Yes [provider]  memantine (NAMENDA XR) 28 MG CP24 24 hr capsule Take 1 capsule (28 mg total) by mouth every morning. 04/25/17  Yes Garvin Fila, MD  Multiple Vitamins-Minerals (MULTIVITAMIN WITH MINERALS) tablet Take 1 tablet by mouth every morning.    Yes [provider]  polyethylene glycol (MIRALAX / GLYCOLAX) packet Take 17 g by mouth daily as needed (constipation).   Yes [provider]  potassium chloride SA (KLOR-CON M20) 20 MEQ tablet Take 2 tablets (40 mEq total) by mouth daily. 09/29/17  Yes [name], DO  QUEtiapine (SEROQUEL) 25 MG tablet Take 1 tablet (25 mg total) by mouth at bedtime. May use half a tablet 12.5 mg during the daytime as needed for agitation/hallucinations Patient taking differently: Take 12.5 mg by mouth as needed (sleep). May use half a tablet 12.5 mg during the daytime as needed for agitation/hallucinations 04/05/17  Yes Garvin Fila, MD  senna (SENOKOT) 8.6 MG TABS tablet Take 1 tablet (8.6 mg total) by mouth daily as needed for mild constipation. 07/13/16  Yes Velna Ochs, MD  vitamin C (ASCORBIC ACID) 500 MG tablet Take 1,000 mg by mouth every  morning.    Yes [provider]  warfarin (COUMADIN) 5 MG tablet Take 0.5-1 tablets (2.5-5 mg total) by mouth See admin instructions. Take 1 tablet on Mon, and Fri. Take 1/2 tablet all other days Patient taking differently: Take 2.5-5 mg by mouth See admin instructions. 5mg  Mon, Wed - 2.5mg  all other days 05/26/17  Yes [name], DO  amLODipine (NORVASC) 2.5 MG tablet Take 1 tablet (2.5 mg total) by mouth daily. 11/17/17 02/15/18  Ledell Noss, MD   Past Medical History:  Diagnosis Date  . Anxiety    . Coronary artery disease, non-occlusive    with history of MI; Cath 2008 w multivessel nonobstructive CAD  . Dementia   . Depression   . GERD (gastroesophageal reflux disease)   . Hyperlipidemia   . Hypertension   . Memory loss   . Parkinson disease ([place])   . PE (pulmonary embolism)    unprovoked PE completed 6 months of warfarin, warfarin d/ced 02/12/2010, repeat PE 07/10/2010 after a long car ride and now on lifelong coumadin.  . Pituitary microadenoma ([place])    incidental finding CT 12/2009  . Prostate cancer ([place])   . Scoliosis   . Sigmoid volvulus ([place]) 08/02/2016  . Volvulus of colon ([place]) 06/01/2017   Social History   Socioeconomic History  . Marital status: Married    Spouse name: Not on file  . Number of children: 4  . Years of education: master's  . Highest education level: Not on file  Occupational History  . Occupation: Retired    Fish farm manager: RETIRED  Social Needs  . Financial resource strain: Not on file  . Food insecurity:    Worry: Not on file    Inability: Not on file  . Transportation needs:    Medical: Not on file    Non-medical: Not on file  Tobacco Use  . Smoking status: Never Smoker  . Smokeless tobacco: Never Used  Substance and Sexual Activity  . Alcohol use: No    Alcohol/week: 0.0 oz  . Drug use: No  . Sexual activity: Never  Lifestyle  . Physical activity:    Days per week: Not on file    Minutes per session: Not on file  . Stress: Not on file  Relationships  . Social connections:    Talks on phone: Not on file    Gets together: Not on file    Attends religious service: Not on file    Active member of club or organization: Not on file    Attends meetings of clubs or organizations: Not on file    Relationship status: Not on file  Other Topics Concern  . Not on file  Social History Narrative   Lives with daughter. Wife also has severe dementia.    Drinks 1 cup of coffee a day    Family History  Problem Relation Age of Onset  .  Hypertension Father        Passed away from cerebral hemorrhage at age of 74.  [name] Dementia Mother   . Hypertension Child        4 adult children  . Cervical cancer Other   . Lung cancer Other   . Stroke Other   . Heart attack Neg Hx     ASSESSMENT Recent Results: The most recent result is correlated with 22.5 mg per week: Lab Results  Component Value Date   INR 2.2 02/27/2018   INR 2.50 01/30/2018   INR 3.60 01/09/2018  Anticoagulation Dosing: Description   Give one (1) tablet of his 5mg  peach-colored warfarin on Thursdays and Sundays; all other days, give only one-half (1/2) tablet of his 5mg  peach-colored warfarin tablets.     INR today: Therapeutic  PLAN Weekly dose was unchanged.  Patient Instructions  Patient instructed to take medications as defined in the Anti-coagulation Track section of this encounter.  Patient instructed to take today's dose.  Patient instructed to take one (1) tablet of his 5mg  peach-colored warfarin on Thursdays and Sundays; all other days, give only one-half (1/2) tablet of his 5mg  peach-colored warfarin tablets. Patient verbalized understanding of these instructions.     Patient advised to contact clinic or seek medical attention if signs/symptoms of bleeding or thromboembolism occur.  Patient verbalized understanding by repeating back information and was advised to contact me if further medication-related questions arise. Patient was also provided an information handout.  Follow-up Return in 1 month (on 03/27/2018) for Follow up INR at [place], PharmD, CACP, CPP  15 minutes spent face-to-face with the patient during the encounter. 50% of time spent on education. 50% of time was spent on fingerstick point of care INR sample collection, processing, results determination, and documentation in [email or web address].

## 2018-02-28 DIAGNOSIS — R269 Unspecified abnormalities of gait and mobility: Secondary | ICD-10-CM | POA: Diagnosis not present

## 2018-02-28 DIAGNOSIS — G20 Parkinson's disease: Secondary | ICD-10-CM | POA: Diagnosis not present

## 2018-02-28 DIAGNOSIS — R531 Weakness: Secondary | ICD-10-CM | POA: Diagnosis not present

## 2018-02-28 DIAGNOSIS — F0280 Dementia in other diseases classified elsewhere without behavioral disturbance: Secondary | ICD-10-CM | POA: Diagnosis not present

## 2018-02-28 DIAGNOSIS — R001 Bradycardia, unspecified: Secondary | ICD-10-CM | POA: Diagnosis not present

## 2018-02-28 DIAGNOSIS — I110 Hypertensive heart disease with heart failure: Secondary | ICD-10-CM | POA: Diagnosis not present

## 2018-03-01 ENCOUNTER — Ambulatory Visit: Payer: Medicare HMO | Admitting: Podiatry

## 2018-03-01 DIAGNOSIS — R269 Unspecified abnormalities of gait and mobility: Secondary | ICD-10-CM | POA: Diagnosis not present

## 2018-03-01 DIAGNOSIS — F0280 Dementia in other diseases classified elsewhere without behavioral disturbance: Secondary | ICD-10-CM | POA: Diagnosis not present

## 2018-03-01 DIAGNOSIS — R001 Bradycardia, unspecified: Secondary | ICD-10-CM | POA: Diagnosis not present

## 2018-03-01 DIAGNOSIS — R531 Weakness: Secondary | ICD-10-CM | POA: Diagnosis not present

## 2018-03-01 DIAGNOSIS — G20 Parkinson's disease: Secondary | ICD-10-CM | POA: Diagnosis not present

## 2018-03-01 DIAGNOSIS — I110 Hypertensive heart disease with heart failure: Secondary | ICD-10-CM | POA: Diagnosis not present

## 2018-03-02 DIAGNOSIS — R001 Bradycardia, unspecified: Secondary | ICD-10-CM | POA: Diagnosis not present

## 2018-03-02 DIAGNOSIS — R531 Weakness: Secondary | ICD-10-CM | POA: Diagnosis not present

## 2018-03-02 DIAGNOSIS — F0280 Dementia in other diseases classified elsewhere without behavioral disturbance: Secondary | ICD-10-CM | POA: Diagnosis not present

## 2018-03-02 DIAGNOSIS — I110 Hypertensive heart disease with heart failure: Secondary | ICD-10-CM | POA: Diagnosis not present

## 2018-03-02 DIAGNOSIS — R269 Unspecified abnormalities of gait and mobility: Secondary | ICD-10-CM | POA: Diagnosis not present

## 2018-03-02 DIAGNOSIS — G20 Parkinson's disease: Secondary | ICD-10-CM | POA: Diagnosis not present

## 2018-03-02 NOTE — Progress Notes (Signed)
I reviewed Dr. Gladstone Pih note.  INR at goal for indication of PE.

## 2018-03-03 DIAGNOSIS — F0280 Dementia in other diseases classified elsewhere without behavioral disturbance: Secondary | ICD-10-CM | POA: Diagnosis not present

## 2018-03-03 DIAGNOSIS — I110 Hypertensive heart disease with heart failure: Secondary | ICD-10-CM | POA: Diagnosis not present

## 2018-03-03 DIAGNOSIS — R269 Unspecified abnormalities of gait and mobility: Secondary | ICD-10-CM | POA: Diagnosis not present

## 2018-03-03 DIAGNOSIS — G20 Parkinson's disease: Secondary | ICD-10-CM | POA: Diagnosis not present

## 2018-03-03 DIAGNOSIS — R001 Bradycardia, unspecified: Secondary | ICD-10-CM | POA: Diagnosis not present

## 2018-03-03 DIAGNOSIS — R531 Weakness: Secondary | ICD-10-CM | POA: Diagnosis not present

## 2018-03-07 DIAGNOSIS — R001 Bradycardia, unspecified: Secondary | ICD-10-CM | POA: Diagnosis not present

## 2018-03-07 DIAGNOSIS — R531 Weakness: Secondary | ICD-10-CM | POA: Diagnosis not present

## 2018-03-07 DIAGNOSIS — I110 Hypertensive heart disease with heart failure: Secondary | ICD-10-CM | POA: Diagnosis not present

## 2018-03-07 DIAGNOSIS — R269 Unspecified abnormalities of gait and mobility: Secondary | ICD-10-CM | POA: Diagnosis not present

## 2018-03-07 DIAGNOSIS — F0280 Dementia in other diseases classified elsewhere without behavioral disturbance: Secondary | ICD-10-CM | POA: Diagnosis not present

## 2018-03-07 DIAGNOSIS — G20 Parkinson's disease: Secondary | ICD-10-CM | POA: Diagnosis not present

## 2018-03-08 DIAGNOSIS — F0280 Dementia in other diseases classified elsewhere without behavioral disturbance: Secondary | ICD-10-CM | POA: Diagnosis not present

## 2018-03-08 DIAGNOSIS — G20 Parkinson's disease: Secondary | ICD-10-CM | POA: Diagnosis not present

## 2018-03-08 DIAGNOSIS — R531 Weakness: Secondary | ICD-10-CM | POA: Diagnosis not present

## 2018-03-08 DIAGNOSIS — I110 Hypertensive heart disease with heart failure: Secondary | ICD-10-CM | POA: Diagnosis not present

## 2018-03-08 DIAGNOSIS — R001 Bradycardia, unspecified: Secondary | ICD-10-CM | POA: Diagnosis not present

## 2018-03-08 DIAGNOSIS — R269 Unspecified abnormalities of gait and mobility: Secondary | ICD-10-CM | POA: Diagnosis not present

## 2018-03-09 DIAGNOSIS — R001 Bradycardia, unspecified: Secondary | ICD-10-CM | POA: Diagnosis not present

## 2018-03-09 DIAGNOSIS — R531 Weakness: Secondary | ICD-10-CM | POA: Diagnosis not present

## 2018-03-09 DIAGNOSIS — G20 Parkinson's disease: Secondary | ICD-10-CM | POA: Diagnosis not present

## 2018-03-09 DIAGNOSIS — I110 Hypertensive heart disease with heart failure: Secondary | ICD-10-CM | POA: Diagnosis not present

## 2018-03-09 DIAGNOSIS — F0280 Dementia in other diseases classified elsewhere without behavioral disturbance: Secondary | ICD-10-CM | POA: Diagnosis not present

## 2018-03-09 DIAGNOSIS — R269 Unspecified abnormalities of gait and mobility: Secondary | ICD-10-CM | POA: Diagnosis not present

## 2018-03-10 DIAGNOSIS — R531 Weakness: Secondary | ICD-10-CM | POA: Diagnosis not present

## 2018-03-10 DIAGNOSIS — R001 Bradycardia, unspecified: Secondary | ICD-10-CM | POA: Diagnosis not present

## 2018-03-10 DIAGNOSIS — G20 Parkinson's disease: Secondary | ICD-10-CM | POA: Diagnosis not present

## 2018-03-10 DIAGNOSIS — I110 Hypertensive heart disease with heart failure: Secondary | ICD-10-CM | POA: Diagnosis not present

## 2018-03-10 DIAGNOSIS — R269 Unspecified abnormalities of gait and mobility: Secondary | ICD-10-CM | POA: Diagnosis not present

## 2018-03-10 DIAGNOSIS — F0280 Dementia in other diseases classified elsewhere without behavioral disturbance: Secondary | ICD-10-CM | POA: Diagnosis not present

## 2018-03-11 DIAGNOSIS — G20 Parkinson's disease: Secondary | ICD-10-CM | POA: Diagnosis not present

## 2018-03-11 DIAGNOSIS — R531 Weakness: Secondary | ICD-10-CM | POA: Diagnosis not present

## 2018-03-11 DIAGNOSIS — R001 Bradycardia, unspecified: Secondary | ICD-10-CM | POA: Diagnosis not present

## 2018-03-11 DIAGNOSIS — F0280 Dementia in other diseases classified elsewhere without behavioral disturbance: Secondary | ICD-10-CM | POA: Diagnosis not present

## 2018-03-11 DIAGNOSIS — I110 Hypertensive heart disease with heart failure: Secondary | ICD-10-CM | POA: Diagnosis not present

## 2018-03-11 DIAGNOSIS — R269 Unspecified abnormalities of gait and mobility: Secondary | ICD-10-CM | POA: Diagnosis not present

## 2018-03-13 DIAGNOSIS — I110 Hypertensive heart disease with heart failure: Secondary | ICD-10-CM | POA: Diagnosis not present

## 2018-03-13 DIAGNOSIS — R269 Unspecified abnormalities of gait and mobility: Secondary | ICD-10-CM | POA: Diagnosis not present

## 2018-03-13 DIAGNOSIS — R531 Weakness: Secondary | ICD-10-CM | POA: Diagnosis not present

## 2018-03-13 DIAGNOSIS — G20 Parkinson's disease: Secondary | ICD-10-CM | POA: Diagnosis not present

## 2018-03-13 DIAGNOSIS — R001 Bradycardia, unspecified: Secondary | ICD-10-CM | POA: Diagnosis not present

## 2018-03-13 DIAGNOSIS — F0280 Dementia in other diseases classified elsewhere without behavioral disturbance: Secondary | ICD-10-CM | POA: Diagnosis not present

## 2018-03-13 NOTE — Telephone Encounter (Signed)
error 

## 2018-03-14 DIAGNOSIS — R001 Bradycardia, unspecified: Secondary | ICD-10-CM | POA: Diagnosis not present

## 2018-03-14 DIAGNOSIS — R531 Weakness: Secondary | ICD-10-CM | POA: Diagnosis not present

## 2018-03-14 DIAGNOSIS — F0280 Dementia in other diseases classified elsewhere without behavioral disturbance: Secondary | ICD-10-CM | POA: Diagnosis not present

## 2018-03-14 DIAGNOSIS — R269 Unspecified abnormalities of gait and mobility: Secondary | ICD-10-CM | POA: Diagnosis not present

## 2018-03-14 DIAGNOSIS — G20 Parkinson's disease: Secondary | ICD-10-CM | POA: Diagnosis not present

## 2018-03-14 DIAGNOSIS — I110 Hypertensive heart disease with heart failure: Secondary | ICD-10-CM | POA: Diagnosis not present

## 2018-03-15 ENCOUNTER — Telehealth: Payer: Self-pay

## 2018-03-15 ENCOUNTER — Ambulatory Visit: Payer: Medicare HMO | Admitting: Internal Medicine

## 2018-03-15 ENCOUNTER — Encounter: Payer: Self-pay | Admitting: Internal Medicine

## 2018-03-15 VITALS — BP 128/86 | HR 75 | Ht 68.0 in | Wt 136.0 lb

## 2018-03-15 DIAGNOSIS — R531 Weakness: Secondary | ICD-10-CM | POA: Diagnosis not present

## 2018-03-15 DIAGNOSIS — I5022 Chronic systolic (congestive) heart failure: Secondary | ICD-10-CM | POA: Diagnosis not present

## 2018-03-15 DIAGNOSIS — R55 Syncope and collapse: Secondary | ICD-10-CM | POA: Diagnosis not present

## 2018-03-15 DIAGNOSIS — R001 Bradycardia, unspecified: Secondary | ICD-10-CM | POA: Diagnosis not present

## 2018-03-15 DIAGNOSIS — I110 Hypertensive heart disease with heart failure: Secondary | ICD-10-CM | POA: Diagnosis not present

## 2018-03-15 DIAGNOSIS — F0280 Dementia in other diseases classified elsewhere without behavioral disturbance: Secondary | ICD-10-CM | POA: Diagnosis not present

## 2018-03-15 DIAGNOSIS — I2583 Coronary atherosclerosis due to lipid rich plaque: Secondary | ICD-10-CM

## 2018-03-15 DIAGNOSIS — I639 Cerebral infarction, unspecified: Secondary | ICD-10-CM | POA: Diagnosis not present

## 2018-03-15 DIAGNOSIS — I10 Essential (primary) hypertension: Secondary | ICD-10-CM

## 2018-03-15 DIAGNOSIS — I2510 Atherosclerotic heart disease of native coronary artery without angina pectoris: Secondary | ICD-10-CM

## 2018-03-15 DIAGNOSIS — R269 Unspecified abnormalities of gait and mobility: Secondary | ICD-10-CM | POA: Diagnosis not present

## 2018-03-15 DIAGNOSIS — G20 Parkinson's disease: Secondary | ICD-10-CM | POA: Diagnosis not present

## 2018-03-15 NOTE — Patient Instructions (Signed)
Medication Instructions: Your physician recommends that you continue on your current medications as directed. Please refer to the Current Medication list given to you today.   Labwork: None Ordered   Procedures/Testing: None Ordered   Follow-Up: Your physician recommends that you schedule a follow-up appointment as needed   Any Additional Special Instructions Will Be Listed Below (If Applicable).     If you need a refill on your cardiac medications before your next appointment, please call your pharmacy.

## 2018-03-15 NOTE — Progress Notes (Signed)
Electrophysiology Office Note   Date:  03/15/2018   ID:  [name] [name], DOB 1931/03/07, MRN [id number]  PCP:  [name], DO  Cardiologist:   Primary Electrophysiologist:   [name], MD    No chief complaint on file.    History of Present Illness: [name] [name] is a 84 y.o. male seen in follow-up for syncope with some degree of bradycardia.  He also has PVCs which may be contributing to his palpated bradycardia.  He underwent loop recorder insertion.   Falls x2 (both 2016) were associated with no significant arrhythmia  No obvious chest pain or shortness of breath   No interval falls   DATE TEST EF   10/14 Echo  55-60%   3/16 LHC 50-55% CAD nonobstructive  3/16 Echo  40-45 %             Past Medical History:  Diagnosis Date  . Anxiety   . Coronary artery disease, non-occlusive    with history of MI; Cath 2008 w multivessel nonobstructive CAD  . Dementia   . Depression   . GERD (gastroesophageal reflux disease)   . Hyperlipidemia   . Hypertension   . Memory loss   . Parkinson disease ([place])   . PE (pulmonary embolism)    unprovoked PE completed 6 months of warfarin, warfarin d/ced 02/12/2010, repeat PE 07/10/2010 after a long car ride and now on lifelong coumadin.  . Pituitary microadenoma ([place])    incidental finding CT 12/2009  . Prostate cancer ([place])   . Scoliosis   . Sigmoid volvulus ([place]) 08/02/2016  . Volvulus of colon ([place]) 06/01/2017   Past Surgical History:  Procedure Laterality Date  . CATARACT EXTRACTION    . COLONOSCOPY N/A 05/30/2017   Procedure: COLONOSCOPY;  Surgeon: Ronald Lobo, MD;  Location: [facility] ENDOSCOPY;  Service: Endoscopy;  Laterality: N/A;  . FLEXIBLE SIGMOIDOSCOPY N/A 08/02/2016   Procedure: FLEXIBLE SIGMOIDOSCOPY;  Surgeon: Ronald Lobo, MD;  Location[facility] ENDOSCOPY;  Service: Endoscopy;  Laterality: N/A;  . FLEXIBLE SIGMOIDOSCOPY N/A 08/06/2016   Procedure: FLEXIBLE SIGMOIDOSCOPY;  Surgeon: Ronald Lobo, MD;   Location: [facility] ENDOSCOPY;  Service: Endoscopy;  Laterality: N/A;  . FLEXIBLE SIGMOIDOSCOPY Left 06/02/2017   Procedure: FLEXIBLE SIGMOIDOSCOPY;  Surgeon: Arta Silence, MD;  Location: [facility] ENDOSCOPY;  Service: Endoscopy;  Laterality: Left;  . FLEXIBLE SIGMOIDOSCOPY N/A 06/07/2017   Procedure: FLEXIBLE SIGMOIDOSCOPY for possible decompression;  Surgeon: Otis Brace, MD;  Location: [place];  Service: Gastroenterology;  Laterality: N/A;  . LEFT HEART CATHETERIZATION WITH CORONARY ANGIOGRAM N/A 12/10/2014   Procedure: LEFT HEART CATHETERIZATION WITH CORONARY ANGIOGRAM;  Surgeon: [name], MD;  Location: [facility] CATH LAB;  Service: Cardiovascular;  Laterality: N/A;  . LOOP RECORDER IMPLANT N/A 12/16/2014   Procedure: LOOP RECORDER IMPLANT;  Surgeon: [name], MD;  Location: [facility] CATH LAB;  Service: Cardiovascular;  Laterality: N/A;  . PARTIAL COLECTOMY N/A 06/08/2017   Procedure: SIGMOID  COLECTOMY;  Surgeon: Georganna Skeans, MD;  Location: [place];  Service: General;  Laterality: N/A;  . PROSTATECTOMY       Current Outpatient Medications  Medication Sig Dispense Refill  . acetaminophen (TYLENOL) 500 MG tablet Take 1,000 mg by mouth 2 (two) times daily.     [name] amLODipine (NORVASC) 2.5 MG tablet Take 1 tablet (2.5 mg total) by mouth daily. 90 tablet 1  . b complex vitamins tablet Take 1 tablet by mouth every morning.     . Ca Phosphate-Cholecalciferol (CALCIUM/VITAMIN D3 GUMMIES) 250-350 MG-UNIT CHEW  Chew 1 Dose by mouth 2 (two) times daily. 180 tablet 3  . carbidopa-levodopa (SINEMET IR) 25-100 MG tablet Take 1 tablet by mouth 4 (four) times daily. 120 tablet 2  . fish oil-omega-3 fatty acids 1000 MG capsule Take 2 g by mouth every morning.     . memantine (NAMENDA XR) 28 MG CP24 24 hr capsule Take 1 capsule (28 mg total) by mouth every morning. 90 capsule 3  . Multiple Vitamins-Minerals (MULTIVITAMIN WITH MINERALS) tablet Take 1 tablet by mouth every morning.     . polyethylene glycol  (MIRALAX / GLYCOLAX) packet Take 17 g by mouth daily as needed (constipation).    . potassium chloride SA (KLOR-CON M20) 20 MEQ tablet Take 2 tablets (40 mEq total) by mouth daily. 180 tablet 1  . QUEtiapine (SEROQUEL) 25 MG tablet Take 1 tablet (25 mg total) by mouth at bedtime. May use half a tablet 12.5 mg during the daytime as needed for agitation/hallucinations (Patient taking differently: Take 12.5 mg by mouth as needed (sleep). May use half a tablet 12.5 mg during the daytime as needed for agitation/hallucinations) 90 tablet 4  . senna (SENOKOT) 8.6 MG TABS tablet Take 1 tablet (8.6 mg total) by mouth daily as needed for mild constipation. 120 each 0  . vitamin C (ASCORBIC ACID) 500 MG tablet Take 1,000 mg by mouth every morning.     . warfarin (COUMADIN) 5 MG tablet Take 0.5-1 tablets (2.5-5 mg total) by mouth See admin instructions. Take 1 tablet on Mon, and Fri. Take 1/2 tablet all other days (Patient taking differently: Take 2.5-5 mg by mouth See admin instructions. 5mg  Mon, Wed - 2.5mg  all other days) 30 tablet 3   No current facility-administered medications for this visit.     Allergies:   Aricept [donepezil hcl]; Other; and Shellfish-derived products   Social History:  The patient  reports that he has never smoked. He has never used smokeless tobacco. He reports that he does not drink alcohol or use drugs.   Family History:  The patient's    family history includes Cervical cancer in his other; Dementia in his mother; Hypertension in his child and father; Lung cancer in his other; Stroke in his other.    ROS:  Please see the history of present illness.   Otherwise, review of systems is negative *.    PHYSICAL EXAM: VS:  BP 128/86   Pulse 75   Ht 5\' 8"  (1.727 m)   Wt 136 lb (61.7 kg)   BMI 20.68 kg/m  , BMI Body mass index is 20.68 kg/m. Well developed and nourished in no acute distress largely non verbal  HENT normal Neck supple with JVP-flat Clear Regular rate and  rhythm, no murmurs or gallops Abd-soft with active BS No Clubbing cyanosis edema Skin-warm and dry Alert and sitting in wheel chair   ECG Sinus@ 75 16/15/41 RBBB  Recent Labs: 05/06/2017: TSH 0.847 06/13/2017: Magnesium 1.8 11/12/2017: ALT 8 11/13/2017: Hemoglobin 12.5; Platelets 162 11/17/2017: BUN 7; Creatinine, Ser 0.74; Potassium 4.4; Sodium 139    Lipid Panel     Component Value Date/Time   CHOL 183 07/08/2016 1035   TRIG 95 07/08/2016 1035   HDL 82 07/08/2016 1035   CHOLHDL 2.2 07/08/2016 1035   CHOLHDL 3.7 06/30/2012 1153   VLDL 26 06/30/2012 1153   LDLCALC 82 07/08/2016 1035     Wt Readings from Last 3 Encounters:  03/15/18 136 lb (61.7 kg)  11/12/17 136 lb (61.7 kg)  11/10/17 124 lb (56.2 kg)      Other studies Reviewed: Additional studies/ records that were reviewed today include LHC 12/10/14 Left main: Large caliber normal long vessel which bifurcated into an LAD and left circumflex vessel.  LAD: Diagonal prox 40-50% (small). LAD after Dx 50%; mid LAD with significant mid systolic bridging with narrowing up to 80% during systole that improved with IC nitroglycerin and with diastole 20% narrowed. Left circumflex: Small tortuous vessel which gave rise to 2 OM branches.  Right coronary artery: Large dominant normal vessel that supplied the PDA and PLA vessel. Left ventriculography: EF 50-55%. There was ectopy without definitive wall motion abnormalities.  Echo 12/09/14 - EF 40% to 45%. [facility] 12/10/14 Left main: Large caliber normal long vessel which bifurcated into an LAD and left circumflex vessel.  LAD: Diagonal prox 40-50% (small). LAD after Dx 50%; mid LAD with significant mid systolic bridging with narrowing up to 80% during systole that improved with IC nitroglycerin and with diastole 20% narrowed. Left circumflex: Small tortuous vessel which gave rise to 2 OM branches.  Right coronary artery: Large dominant normal vessel that supplied the  PDA and PLA vessel. Left ventriculography: EF 50-55%. There was ectopy without definitive wall motion abnormalities.      ASSESSMENT AND PLAN: 1. Syncope   Bradycardia  CAD - nonobstructive    Recurrent PE    HTN  Parkinson's   Orthostatic lightheadedness   LINQ   No syncope  LINQ at RRT  Discussed with daughter-- we will not remove.    Pts husband died about a month ago-- family struggling  On Anticoagulation;  No bleeding issues   Without symptoms of ischemia   We spent more than 50% of our >25 min visit in face to face counseling regarding the above     The patient does not have  concerns regarding his medicines.  The following changes were made today:  none      Signed, [name], MD  03/15/2018 3:59 PM     [address] [place] [address] [phone] (office) [phone] (fax)

## 2018-03-15 NOTE — Telephone Encounter (Signed)
Confirmed remote transmission w/ pt daughter.   

## 2018-03-16 DIAGNOSIS — I110 Hypertensive heart disease with heart failure: Secondary | ICD-10-CM | POA: Diagnosis not present

## 2018-03-16 DIAGNOSIS — G20 Parkinson's disease: Secondary | ICD-10-CM | POA: Diagnosis not present

## 2018-03-16 DIAGNOSIS — R531 Weakness: Secondary | ICD-10-CM | POA: Diagnosis not present

## 2018-03-16 DIAGNOSIS — R269 Unspecified abnormalities of gait and mobility: Secondary | ICD-10-CM | POA: Diagnosis not present

## 2018-03-16 DIAGNOSIS — R001 Bradycardia, unspecified: Secondary | ICD-10-CM | POA: Diagnosis not present

## 2018-03-16 DIAGNOSIS — F0280 Dementia in other diseases classified elsewhere without behavioral disturbance: Secondary | ICD-10-CM | POA: Diagnosis not present

## 2018-03-16 LAB — CUP PACEART INCLINIC DEVICE CHECK
Date Time Interrogation Session: 20190626195612
MDC IDC PG IMPLANT DT: 20160328

## 2018-03-17 DIAGNOSIS — R001 Bradycardia, unspecified: Secondary | ICD-10-CM | POA: Diagnosis not present

## 2018-03-17 DIAGNOSIS — R269 Unspecified abnormalities of gait and mobility: Secondary | ICD-10-CM | POA: Diagnosis not present

## 2018-03-17 DIAGNOSIS — R531 Weakness: Secondary | ICD-10-CM | POA: Diagnosis not present

## 2018-03-17 DIAGNOSIS — G20 Parkinson's disease: Secondary | ICD-10-CM | POA: Diagnosis not present

## 2018-03-17 DIAGNOSIS — F0280 Dementia in other diseases classified elsewhere without behavioral disturbance: Secondary | ICD-10-CM | POA: Diagnosis not present

## 2018-03-17 DIAGNOSIS — I110 Hypertensive heart disease with heart failure: Secondary | ICD-10-CM | POA: Diagnosis not present

## 2018-03-20 DIAGNOSIS — G20 Parkinson's disease: Secondary | ICD-10-CM | POA: Diagnosis not present

## 2018-03-20 DIAGNOSIS — F0280 Dementia in other diseases classified elsewhere without behavioral disturbance: Secondary | ICD-10-CM | POA: Diagnosis not present

## 2018-03-20 DIAGNOSIS — R001 Bradycardia, unspecified: Secondary | ICD-10-CM | POA: Diagnosis not present

## 2018-03-20 DIAGNOSIS — R159 Full incontinence of feces: Secondary | ICD-10-CM | POA: Diagnosis not present

## 2018-03-20 DIAGNOSIS — M1A00X0 Idiopathic chronic gout, unspecified site, without tophus (tophi): Secondary | ICD-10-CM | POA: Diagnosis not present

## 2018-03-20 DIAGNOSIS — Z515 Encounter for palliative care: Secondary | ICD-10-CM | POA: Diagnosis not present

## 2018-03-20 DIAGNOSIS — R32 Unspecified urinary incontinence: Secondary | ICD-10-CM | POA: Diagnosis not present

## 2018-03-20 DIAGNOSIS — R269 Unspecified abnormalities of gait and mobility: Secondary | ICD-10-CM | POA: Diagnosis not present

## 2018-03-20 DIAGNOSIS — R531 Weakness: Secondary | ICD-10-CM | POA: Diagnosis not present

## 2018-03-20 DIAGNOSIS — I110 Hypertensive heart disease with heart failure: Secondary | ICD-10-CM | POA: Diagnosis not present

## 2018-03-20 DIAGNOSIS — I639 Cerebral infarction, unspecified: Secondary | ICD-10-CM

## 2018-03-20 HISTORY — DX: Cerebral infarction, unspecified: I63.9

## 2018-03-21 DIAGNOSIS — R269 Unspecified abnormalities of gait and mobility: Secondary | ICD-10-CM | POA: Diagnosis not present

## 2018-03-21 DIAGNOSIS — R531 Weakness: Secondary | ICD-10-CM | POA: Diagnosis not present

## 2018-03-21 DIAGNOSIS — G20 Parkinson's disease: Secondary | ICD-10-CM | POA: Diagnosis not present

## 2018-03-21 DIAGNOSIS — I110 Hypertensive heart disease with heart failure: Secondary | ICD-10-CM | POA: Diagnosis not present

## 2018-03-21 DIAGNOSIS — F0280 Dementia in other diseases classified elsewhere without behavioral disturbance: Secondary | ICD-10-CM | POA: Diagnosis not present

## 2018-03-21 DIAGNOSIS — R001 Bradycardia, unspecified: Secondary | ICD-10-CM | POA: Diagnosis not present

## 2018-03-22 DIAGNOSIS — R531 Weakness: Secondary | ICD-10-CM | POA: Diagnosis not present

## 2018-03-22 DIAGNOSIS — I110 Hypertensive heart disease with heart failure: Secondary | ICD-10-CM | POA: Diagnosis not present

## 2018-03-22 DIAGNOSIS — G20 Parkinson's disease: Secondary | ICD-10-CM | POA: Diagnosis not present

## 2018-03-22 DIAGNOSIS — F0280 Dementia in other diseases classified elsewhere without behavioral disturbance: Secondary | ICD-10-CM | POA: Diagnosis not present

## 2018-03-22 DIAGNOSIS — R269 Unspecified abnormalities of gait and mobility: Secondary | ICD-10-CM | POA: Diagnosis not present

## 2018-03-22 DIAGNOSIS — R001 Bradycardia, unspecified: Secondary | ICD-10-CM | POA: Diagnosis not present

## 2018-03-24 DIAGNOSIS — R531 Weakness: Secondary | ICD-10-CM | POA: Diagnosis not present

## 2018-03-24 DIAGNOSIS — F0280 Dementia in other diseases classified elsewhere without behavioral disturbance: Secondary | ICD-10-CM | POA: Diagnosis not present

## 2018-03-24 DIAGNOSIS — G20 Parkinson's disease: Secondary | ICD-10-CM | POA: Diagnosis not present

## 2018-03-24 DIAGNOSIS — R001 Bradycardia, unspecified: Secondary | ICD-10-CM | POA: Diagnosis not present

## 2018-03-24 DIAGNOSIS — R269 Unspecified abnormalities of gait and mobility: Secondary | ICD-10-CM | POA: Diagnosis not present

## 2018-03-24 DIAGNOSIS — I110 Hypertensive heart disease with heart failure: Secondary | ICD-10-CM | POA: Diagnosis not present

## 2018-03-27 ENCOUNTER — Other Ambulatory Visit: Payer: Self-pay | Admitting: *Deleted

## 2018-03-27 ENCOUNTER — Ambulatory Visit (INDEPENDENT_AMBULATORY_CARE_PROVIDER_SITE_OTHER): Payer: Medicare Other | Admitting: Pharmacist

## 2018-03-27 DIAGNOSIS — R001 Bradycardia, unspecified: Secondary | ICD-10-CM | POA: Diagnosis not present

## 2018-03-27 DIAGNOSIS — Z7902 Long term (current) use of antithrombotics/antiplatelets: Secondary | ICD-10-CM | POA: Diagnosis not present

## 2018-03-27 DIAGNOSIS — I110 Hypertensive heart disease with heart failure: Secondary | ICD-10-CM | POA: Diagnosis not present

## 2018-03-27 DIAGNOSIS — G20 Parkinson's disease: Secondary | ICD-10-CM | POA: Diagnosis not present

## 2018-03-27 DIAGNOSIS — Z86711 Personal history of pulmonary embolism: Secondary | ICD-10-CM | POA: Diagnosis not present

## 2018-03-27 DIAGNOSIS — F0280 Dementia in other diseases classified elsewhere without behavioral disturbance: Secondary | ICD-10-CM | POA: Diagnosis not present

## 2018-03-27 DIAGNOSIS — R269 Unspecified abnormalities of gait and mobility: Secondary | ICD-10-CM | POA: Diagnosis not present

## 2018-03-27 DIAGNOSIS — R531 Weakness: Secondary | ICD-10-CM | POA: Diagnosis not present

## 2018-03-27 LAB — POCT INR: INR: 1.7 — AB (ref 2.0–3.0)

## 2018-03-27 NOTE — Patient Instructions (Signed)
Patient instructed to take medications as defined in the Anti-coagulation Track section of this encounter.  Patient instructed to take today's dose.  Patient instructed to take  one (1) tablet of his 5mg  peach-colored warfarin every day--EXCEPT on Tuesdays,  Thursdays and Saturdays, give only one-half (1/2) tablet of his 5mg  peach-colored warfarin tablets. Patient verbalized understanding of these instructions.

## 2018-03-27 NOTE — Progress Notes (Signed)
Anticoagulation Management [name] [name] is a 84 y.o. male who reports to the clinic for monitoring of warfarin treatment.    Indication: PE, history of; chronic anticoagulation.  Duration: indefinite Supervising physician: Aldine Contes  Anticoagulation Clinic Visit History: Patient does not report signs/symptoms of bleeding or thromboembolism  Other recent changes: No diet, medications, lifestyle changes except as noted in patient findings section.  Anticoagulation Episode Summary    Current INR goal:   2.0-3.0  TTR:   73.4 % (7.2 y)  Next INR check:   04/17/2018  INR from last check:   1.7! (03/27/2018)  Weekly max warfarin dose:     Target end date:   Indefinite  INR check location:   Anticoagulation Clinic  Preferred lab:     Send INR reminders to:   [facility]   Indications   History of pulmonary embolism [Z86.711] Encounter for long-term use of antiplatelets/antithrombotics [Z79.02]       Comments:   History of multiple venous embolic episodes. Will continue to annual re-evaluate continued need for warfarin weighing risks vs. benefits.       Anticoagulation Care Providers    Provider Role Specialty Phone number   Bertha Stakes, MD  Internal Medicine [phone]      Allergies  Allergen Reactions  . Aricept [Donepezil Hcl] Other (See Comments)    Symptomatic Bradycardia.  . Other Other (See Comments)    Shrimp gives him gout  . Shellfish-Derived Products Other (See Comments)    Causes a flare up with Gout   Prior to Admission medications   Medication Sig Start Date End Date Taking? Authorizing Provider  acetaminophen (TYLENOL) 500 MG tablet Take 1,000 mg by mouth 2 (two) times daily.    Yes [provider]  amLODipine (NORVASC) 2.5 MG tablet Take 1 tablet (2.5 mg total) by mouth daily. 11/17/17  Yes Ledell Noss, MD  b complex vitamins tablet Take 1 tablet by mouth every morning.    Yes [provider]  Ca Phosphate-Cholecalciferol  (CALCIUM/VITAMIN D3 GUMMIES) 250-350 MG-UNIT CHEW Chew 1 Dose by mouth 2 (two) times daily. 07/08/16  Yes [name], DO  carbidopa-levodopa (SINEMET IR) 25-100 MG tablet Take 1 tablet by mouth 4 (four) times daily. 10/28/17  Yes [name], DO  fish oil-omega-3 fatty acids 1000 MG capsule Take 2 g by mouth every morning.    Yes [provider]  memantine (NAMENDA XR) 28 MG CP24 24 hr capsule Take 1 capsule (28 mg total) by mouth every morning. 04/25/17  Yes Garvin Fila, MD  Multiple Vitamins-Minerals (MULTIVITAMIN WITH MINERALS) tablet Take 1 tablet by mouth every morning.    Yes [provider]  polyethylene glycol (MIRALAX / GLYCOLAX) packet Take 17 g by mouth daily as needed (constipation).   Yes [provider]  potassium chloride SA (KLOR-CON M20) 20 MEQ tablet Take 2 tablets (40 mEq total) by mouth daily. 09/29/17  Yes [name], DO  QUEtiapine (SEROQUEL) 25 MG tablet Take 1 tablet (25 mg total) by mouth at bedtime. May use half a tablet 12.5 mg during the daytime as needed for agitation/hallucinations Patient taking differently: Take 12.5 mg by mouth as needed (sleep). May use half a tablet 12.5 mg during the daytime as needed for agitation/hallucinations 04/05/17  Yes Garvin Fila, MD  senna (SENOKOT) 8.6 MG TABS tablet Take 1 tablet (8.6 mg total) by mouth daily as needed for mild constipation. 07/13/16  Yes Velna Ochs, MD  vitamin C (ASCORBIC  ACID) 500 MG tablet Take 1,000 mg by mouth every morning.    Yes [provider]  warfarin (COUMADIN) 5 MG tablet Take 0.5-1 tablets (2.5-5 mg total) by mouth See admin instructions. Take 1 tablet on Mon, and Fri. Take 1/2 tablet all other days Patient taking differently: Take 2.5-5 mg by mouth See admin instructions. 5mg  Mon, Wed - 2.5mg  all other days 05/26/17  Yes [name], DO   Past Medical History:  Diagnosis Date  . Anxiety   . Coronary artery disease, non-occlusive    with  history of MI; Cath 2008 w multivessel nonobstructive CAD  . Dementia   . Depression   . GERD (gastroesophageal reflux disease)   . Hyperlipidemia   . Hypertension   . Memory loss   . Parkinson disease ([place])   . PE (pulmonary embolism)    unprovoked PE completed 6 months of warfarin, warfarin d/ced 02/12/2010, repeat PE 07/10/2010 after a long car ride and now on lifelong coumadin.  . Pituitary microadenoma ([place])    incidental finding CT 12/2009  . Prostate cancer ([place])   . Scoliosis   . Sigmoid volvulus ([place]) 08/02/2016  . Volvulus of colon ([place]) 06/01/2017   Social History   Socioeconomic History  . Marital status: Married    Spouse name: Not on file  . Number of children: 4  . Years of education: master's  . Highest education level: Not on file  Occupational History  . Occupation: Retired    Fish farm manager: RETIRED  Social Needs  . Financial resource strain: Not on file  . Food insecurity:    Worry: Not on file    Inability: Not on file  . Transportation needs:    Medical: Not on file    Non-medical: Not on file  Tobacco Use  . Smoking status: Never Smoker  . Smokeless tobacco: Never Used  Substance and Sexual Activity  . Alcohol use: No    Alcohol/week: 0.0 oz  . Drug use: No  . Sexual activity: Never  Lifestyle  . Physical activity:    Days per week: Not on file    Minutes per session: Not on file  . Stress: Not on file  Relationships  . Social connections:    Talks on phone: Not on file    Gets together: Not on file    Attends religious service: Not on file    Active member of club or organization: Not on file    Attends meetings of clubs or organizations: Not on file    Relationship status: Not on file  Other Topics Concern  . Not on file  Social History Narrative   Lives with daughter. Wife also has severe dementia.    Drinks 1 cup of coffee a day    Family History  Problem Relation Age of Onset  . Hypertension Father        Passed away from cerebral  hemorrhage at age of 74.  [name] Dementia Mother   . Hypertension Child        4 adult children  . Cervical cancer Other   . Lung cancer Other   . Stroke Other   . Heart attack Neg Hx     ASSESSMENT Recent Results: The most recent result is correlated with 22.5 mg per week: Lab Results  Component Value Date   INR 1.7 (A) 03/27/2018   INR 2.2 02/27/2018   INR 2.50 01/30/2018    Anticoagulation Dosing: Description   Give one (1) tablet  of his 5mg  peach-colored warfarin every day--EXCEPT on Tuesdays,  Thursdays and Saturdays, give only one-half (1/2) tablet of his 5mg  peach-colored warfarin tablets.     INR today: Subtherapeutic  PLAN Weekly dose was increased by 20% to 27.5 mg per week  Patient Instructions  Patient instructed to take medications as defined in the Anti-coagulation Track section of this encounter.  Patient instructed to take today's dose.  Patient instructed to take  one (1) tablet of his 5mg  peach-colored warfarin every day--EXCEPT on Tuesdays,  Thursdays and Saturdays, give only one-half (1/2) tablet of his 5mg  peach-colored warfarin tablets. Patient verbalized understanding of these instructions.     Patient advised to contact clinic or seek medical attention if signs/symptoms of bleeding or thromboembolism occur.  Patient verbalized understanding by repeating back information and was advised to contact me if further medication-related questions arise. Patient was also provided an information handout.  Follow-up Return in about 3 weeks (around 04/17/2018) for Follow up INR at 1115h.  Pennie Banter, PharmD, CPP  15 minutes spent face-to-face with the patient during the encounter. 50% of time spent on education. 50% of time was spent on fingerstick point of care INR sample collection, processing, results determination, dose adjustment and documentation in [email or web address].

## 2018-03-27 NOTE — Progress Notes (Signed)
INTERNAL MEDICINE TEACHING ATTENDING ADDENDUM - Aldine Contes M.D  Duration- indefinite, Indication- history of PE, INR- sub therapeutic. Agree with pharmacy recommendations as outlined in their note.

## 2018-03-28 DIAGNOSIS — G20 Parkinson's disease: Secondary | ICD-10-CM | POA: Diagnosis not present

## 2018-03-28 DIAGNOSIS — R269 Unspecified abnormalities of gait and mobility: Secondary | ICD-10-CM | POA: Diagnosis not present

## 2018-03-28 DIAGNOSIS — F0280 Dementia in other diseases classified elsewhere without behavioral disturbance: Secondary | ICD-10-CM | POA: Diagnosis not present

## 2018-03-28 DIAGNOSIS — R531 Weakness: Secondary | ICD-10-CM | POA: Diagnosis not present

## 2018-03-28 DIAGNOSIS — R001 Bradycardia, unspecified: Secondary | ICD-10-CM | POA: Diagnosis not present

## 2018-03-28 DIAGNOSIS — I110 Hypertensive heart disease with heart failure: Secondary | ICD-10-CM | POA: Diagnosis not present

## 2018-03-28 MED ORDER — WARFARIN SODIUM 5 MG PO TABS: ORAL_TABLET | ORAL | 3 refills | Status: DC

## 2018-03-29 DIAGNOSIS — R531 Weakness: Secondary | ICD-10-CM | POA: Diagnosis not present

## 2018-03-29 DIAGNOSIS — I110 Hypertensive heart disease with heart failure: Secondary | ICD-10-CM | POA: Diagnosis not present

## 2018-03-29 DIAGNOSIS — G20 Parkinson's disease: Secondary | ICD-10-CM | POA: Diagnosis not present

## 2018-03-29 DIAGNOSIS — R269 Unspecified abnormalities of gait and mobility: Secondary | ICD-10-CM | POA: Diagnosis not present

## 2018-03-29 DIAGNOSIS — F0280 Dementia in other diseases classified elsewhere without behavioral disturbance: Secondary | ICD-10-CM | POA: Diagnosis not present

## 2018-03-29 DIAGNOSIS — R001 Bradycardia, unspecified: Secondary | ICD-10-CM | POA: Diagnosis not present

## 2018-03-30 DIAGNOSIS — R001 Bradycardia, unspecified: Secondary | ICD-10-CM | POA: Diagnosis not present

## 2018-03-30 DIAGNOSIS — G20 Parkinson's disease: Secondary | ICD-10-CM | POA: Diagnosis not present

## 2018-03-30 DIAGNOSIS — R269 Unspecified abnormalities of gait and mobility: Secondary | ICD-10-CM | POA: Diagnosis not present

## 2018-03-30 DIAGNOSIS — F0280 Dementia in other diseases classified elsewhere without behavioral disturbance: Secondary | ICD-10-CM | POA: Diagnosis not present

## 2018-03-30 DIAGNOSIS — R531 Weakness: Secondary | ICD-10-CM | POA: Diagnosis not present

## 2018-03-30 DIAGNOSIS — I110 Hypertensive heart disease with heart failure: Secondary | ICD-10-CM | POA: Diagnosis not present

## 2018-03-31 ENCOUNTER — Ambulatory Visit: Payer: Medicare HMO

## 2018-03-31 DIAGNOSIS — G20 Parkinson's disease: Secondary | ICD-10-CM | POA: Diagnosis not present

## 2018-03-31 DIAGNOSIS — R269 Unspecified abnormalities of gait and mobility: Secondary | ICD-10-CM | POA: Diagnosis not present

## 2018-03-31 DIAGNOSIS — I110 Hypertensive heart disease with heart failure: Secondary | ICD-10-CM | POA: Diagnosis not present

## 2018-03-31 DIAGNOSIS — R531 Weakness: Secondary | ICD-10-CM | POA: Diagnosis not present

## 2018-03-31 DIAGNOSIS — R001 Bradycardia, unspecified: Secondary | ICD-10-CM | POA: Diagnosis not present

## 2018-03-31 DIAGNOSIS — F0280 Dementia in other diseases classified elsewhere without behavioral disturbance: Secondary | ICD-10-CM | POA: Diagnosis not present

## 2018-04-03 DIAGNOSIS — R269 Unspecified abnormalities of gait and mobility: Secondary | ICD-10-CM | POA: Diagnosis not present

## 2018-04-03 DIAGNOSIS — I110 Hypertensive heart disease with heart failure: Secondary | ICD-10-CM | POA: Diagnosis not present

## 2018-04-03 DIAGNOSIS — F0280 Dementia in other diseases classified elsewhere without behavioral disturbance: Secondary | ICD-10-CM | POA: Diagnosis not present

## 2018-04-03 DIAGNOSIS — R531 Weakness: Secondary | ICD-10-CM | POA: Diagnosis not present

## 2018-04-03 DIAGNOSIS — G20 Parkinson's disease: Secondary | ICD-10-CM | POA: Diagnosis not present

## 2018-04-03 DIAGNOSIS — R001 Bradycardia, unspecified: Secondary | ICD-10-CM | POA: Diagnosis not present

## 2018-04-04 DIAGNOSIS — F0280 Dementia in other diseases classified elsewhere without behavioral disturbance: Secondary | ICD-10-CM | POA: Diagnosis not present

## 2018-04-04 DIAGNOSIS — R001 Bradycardia, unspecified: Secondary | ICD-10-CM | POA: Diagnosis not present

## 2018-04-04 DIAGNOSIS — R269 Unspecified abnormalities of gait and mobility: Secondary | ICD-10-CM | POA: Diagnosis not present

## 2018-04-04 DIAGNOSIS — R531 Weakness: Secondary | ICD-10-CM | POA: Diagnosis not present

## 2018-04-04 DIAGNOSIS — I110 Hypertensive heart disease with heart failure: Secondary | ICD-10-CM | POA: Diagnosis not present

## 2018-04-04 DIAGNOSIS — G20 Parkinson's disease: Secondary | ICD-10-CM | POA: Diagnosis not present

## 2018-04-05 ENCOUNTER — Other Ambulatory Visit: Payer: Self-pay | Admitting: Neurology

## 2018-04-05 DIAGNOSIS — R531 Weakness: Secondary | ICD-10-CM | POA: Diagnosis not present

## 2018-04-05 DIAGNOSIS — R269 Unspecified abnormalities of gait and mobility: Secondary | ICD-10-CM | POA: Diagnosis not present

## 2018-04-05 DIAGNOSIS — I110 Hypertensive heart disease with heart failure: Secondary | ICD-10-CM | POA: Diagnosis not present

## 2018-04-05 DIAGNOSIS — F0280 Dementia in other diseases classified elsewhere without behavioral disturbance: Secondary | ICD-10-CM | POA: Diagnosis not present

## 2018-04-05 DIAGNOSIS — R001 Bradycardia, unspecified: Secondary | ICD-10-CM | POA: Diagnosis not present

## 2018-04-05 DIAGNOSIS — G20 Parkinson's disease: Secondary | ICD-10-CM | POA: Diagnosis not present

## 2018-04-06 ENCOUNTER — Other Ambulatory Visit: Payer: Self-pay | Admitting: Internal Medicine

## 2018-04-06 DIAGNOSIS — R269 Unspecified abnormalities of gait and mobility: Secondary | ICD-10-CM | POA: Diagnosis not present

## 2018-04-06 DIAGNOSIS — R531 Weakness: Secondary | ICD-10-CM | POA: Diagnosis not present

## 2018-04-06 DIAGNOSIS — G20 Parkinson's disease: Secondary | ICD-10-CM | POA: Diagnosis not present

## 2018-04-06 DIAGNOSIS — R001 Bradycardia, unspecified: Secondary | ICD-10-CM | POA: Diagnosis not present

## 2018-04-06 DIAGNOSIS — I110 Hypertensive heart disease with heart failure: Secondary | ICD-10-CM | POA: Diagnosis not present

## 2018-04-06 DIAGNOSIS — F0280 Dementia in other diseases classified elsewhere without behavioral disturbance: Secondary | ICD-10-CM | POA: Diagnosis not present

## 2018-04-07 DIAGNOSIS — G20 Parkinson's disease: Secondary | ICD-10-CM | POA: Diagnosis not present

## 2018-04-07 DIAGNOSIS — R269 Unspecified abnormalities of gait and mobility: Secondary | ICD-10-CM | POA: Diagnosis not present

## 2018-04-07 DIAGNOSIS — F0280 Dementia in other diseases classified elsewhere without behavioral disturbance: Secondary | ICD-10-CM | POA: Diagnosis not present

## 2018-04-07 DIAGNOSIS — R001 Bradycardia, unspecified: Secondary | ICD-10-CM | POA: Diagnosis not present

## 2018-04-07 DIAGNOSIS — R531 Weakness: Secondary | ICD-10-CM | POA: Diagnosis not present

## 2018-04-07 DIAGNOSIS — I110 Hypertensive heart disease with heart failure: Secondary | ICD-10-CM | POA: Diagnosis not present

## 2018-04-11 ENCOUNTER — Encounter: Payer: Self-pay | Admitting: Internal Medicine

## 2018-04-11 ENCOUNTER — Other Ambulatory Visit: Payer: Self-pay

## 2018-04-11 ENCOUNTER — Ambulatory Visit (INDEPENDENT_AMBULATORY_CARE_PROVIDER_SITE_OTHER): Payer: Medicare Other | Admitting: Internal Medicine

## 2018-04-11 VITALS — BP 146/92 | HR 73 | Temp 98.1°F

## 2018-04-11 DIAGNOSIS — G20 Parkinson's disease: Secondary | ICD-10-CM

## 2018-04-11 DIAGNOSIS — Z8673 Personal history of transient ischemic attack (TIA), and cerebral infarction without residual deficits: Secondary | ICD-10-CM | POA: Diagnosis not present

## 2018-04-11 DIAGNOSIS — R269 Unspecified abnormalities of gait and mobility: Secondary | ICD-10-CM | POA: Diagnosis not present

## 2018-04-11 DIAGNOSIS — I509 Heart failure, unspecified: Secondary | ICD-10-CM

## 2018-04-11 DIAGNOSIS — R001 Bradycardia, unspecified: Secondary | ICD-10-CM | POA: Diagnosis not present

## 2018-04-11 DIAGNOSIS — I110 Hypertensive heart disease with heart failure: Secondary | ICD-10-CM | POA: Diagnosis not present

## 2018-04-11 DIAGNOSIS — F0280 Dementia in other diseases classified elsewhere without behavioral disturbance: Secondary | ICD-10-CM | POA: Diagnosis not present

## 2018-04-11 DIAGNOSIS — H0014 Chalazion left upper eyelid: Secondary | ICD-10-CM | POA: Insufficient documentation

## 2018-04-11 DIAGNOSIS — R531 Weakness: Secondary | ICD-10-CM | POA: Diagnosis not present

## 2018-04-11 NOTE — Assessment & Plan Note (Signed)
New. 3 weeks nontender, swollen lump on upper lid of left eye. Has tried warm compresses 3-4 times a day for 15 minutes at a time as well as a 5 day course of doxycyline. Does not appear red and infected so low likelihood of hordeolum. Exam and symptoms most consistent with chalazion  Plan: - ophthalmology referral for possible I&D - continue warm compresses as it may still drain on its own

## 2018-04-11 NOTE — Progress Notes (Signed)
   CC: lump on eyelid   HPI:  [name] is a 84 y.o. male with h/o stroke, dementia, parkinson's disease, CHF, on palliative care who presents for evaluation of lump on left upper eye lid. His daughter is present and very involved in his care. The patient has a hard time speaking due to h/o stroke and parkinson disease. The daughter states the lump has been there for 3 weeks. The lump has not resolved with warm compresses 3-4 times a day for 15 minutes at a time. A palliative care doctor prescribed a 5 day course of doxycyline which still didn't help. The lump does not seem to be painful. He has never had anything like this before. Denies eye pain, vision changes, fevers.   Past Medical History:  Diagnosis Date  . Anxiety   . Coronary artery disease, non-occlusive    with history of MI; Cath 2008 w multivessel nonobstructive CAD  . Dementia   . Depression   . GERD (gastroesophageal reflux disease)   . Hyperlipidemia   . Hypertension   . Memory loss   . Parkinson disease ([place])   . PE (pulmonary embolism)    unprovoked PE completed 6 months of warfarin, warfarin d/ced 02/12/2010, repeat PE 07/10/2010 after a long car ride and now on lifelong coumadin.  . Pituitary microadenoma ([place])    incidental finding CT 12/2009  . Prostate cancer ([place])   . Scoliosis   . Sigmoid volvulus ([place]) 08/02/2016  . Volvulus of colon ([place]) 06/01/2017    Physical Exam:  Vitals:   04/11/18 1551  BP: (!) 146/92  Pulse: 73  Temp: 98.1 F (36.7 C)  TempSrc: Oral  SpO2: 99%   Gen: Frail appearing, in wheelchair with blanket over him. Eyes: EOMI with no pain. PERRL. Left upper eyelid with nontender, rubbery nodule. Slightly erythematous. No fluctuance or discharge from the eye or lump.  CV: RRR, no murmurs Pulm: Normal effort, CTA throughout, no wheezing   Assessment & Plan:   See Encounters Tab for problem based charting.  Patient seen with Dr. Evette Doffing

## 2018-04-11 NOTE — Patient Instructions (Signed)
It was nice to meet you today! I'm sorry this eye problem isn't clearing up on its own. It looks like a Chalazion (clogged eye duct). I have put in an ophthalmology referral and you should receive a call this week to get that appointment scheduled. In the meantime, continue with the warm compresses as the lump might still open up and drain on its own!  Chalazion A chalazion is a swelling or lump on the eyelid. It can affect the upper or lower eyelid. What are the causes? This condition may be caused by:  Long-lasting (chronic) inflammation of the eyelid glands.  A blocked oil gland in the eyelid.  What are the signs or symptoms? Symptoms of this condition include:  A swelling on the eyelid. The swelling may spread to areas around the eye.  A hard lump on the eyelid. This lump may make it hard to see out of the eye.  How is this diagnosed? This condition is diagnosed with an examination of the eye. How is this treated? This condition is treated by applying a warm compress to the eyelid. If the condition does not improve after two days, it may be treated with:  Surgery.  Medicine that is injected into the chalazion by a health care provider.  Medicine that is applied to the eye.  Follow these instructions at home:  Do not touch the chalazion.  Do not try to remove the pus, such as by squeezing the chalazion or sticking it with a pin or needle.  Do not rub your eyes.  Wash your hands often. Dry your hands with a clean towel.  Keep your face, scalp, and eyebrows clean.  Avoid wearing eye makeup.  Apply a warm, moist compress to the eyelid 4-6 times a day for 10-15 minutes at a time. This will help to open any blocked glands and help to reduce redness and swelling.  Apply over-the-counter and prescription medicines only as told by your health care provider.  If the chalazion does not break open (rupture) on its own in a month, return to your health care provider.  Keep all  follow-up appointments as told by your health care provider. This is important. Contact a health care provider if:  Your eyelid has not improved in 4 weeks.  Your eyelid is getting worse.  You have a fever.  The chalazion does not rupture on its own with home treatment in a month. Get help right away if:  You have pain in your eye.  Your vision changes.  The chalazion becomes painful or red  The chalazion gets bigger. This information is not intended to replace advice given to you by your health care provider. Make sure you discuss any questions you have with your health care provider. Document Released: 09/03/2000 Document Revised: 02/12/2016 Document Reviewed: 12/30/2014 Elsevier Interactive Patient Education  [employer].

## 2018-04-12 DIAGNOSIS — R269 Unspecified abnormalities of gait and mobility: Secondary | ICD-10-CM | POA: Diagnosis not present

## 2018-04-12 DIAGNOSIS — G20 Parkinson's disease: Secondary | ICD-10-CM | POA: Diagnosis not present

## 2018-04-12 DIAGNOSIS — R001 Bradycardia, unspecified: Secondary | ICD-10-CM | POA: Diagnosis not present

## 2018-04-12 DIAGNOSIS — I110 Hypertensive heart disease with heart failure: Secondary | ICD-10-CM | POA: Diagnosis not present

## 2018-04-12 DIAGNOSIS — R531 Weakness: Secondary | ICD-10-CM | POA: Diagnosis not present

## 2018-04-12 DIAGNOSIS — F0280 Dementia in other diseases classified elsewhere without behavioral disturbance: Secondary | ICD-10-CM | POA: Diagnosis not present

## 2018-04-12 NOTE — Progress Notes (Signed)
Internal Medicine Clinic Attending  I saw and evaluated the patient.  I personally confirmed the key portions of the history and exam documented by Dr. Vogel and I reviewed pertinent patient test results.  The assessment, diagnosis, and plan were formulated together and I agree with the documentation in the resident's note.  

## 2018-04-13 ENCOUNTER — Other Ambulatory Visit: Payer: Self-pay | Admitting: *Deleted

## 2018-04-13 ENCOUNTER — Other Ambulatory Visit: Payer: Self-pay | Admitting: Neurology

## 2018-04-13 DIAGNOSIS — R531 Weakness: Secondary | ICD-10-CM | POA: Diagnosis not present

## 2018-04-13 DIAGNOSIS — G20 Parkinson's disease: Secondary | ICD-10-CM | POA: Diagnosis not present

## 2018-04-13 DIAGNOSIS — I110 Hypertensive heart disease with heart failure: Secondary | ICD-10-CM | POA: Diagnosis not present

## 2018-04-13 DIAGNOSIS — R451 Restlessness and agitation: Secondary | ICD-10-CM

## 2018-04-13 DIAGNOSIS — R001 Bradycardia, unspecified: Secondary | ICD-10-CM | POA: Diagnosis not present

## 2018-04-13 DIAGNOSIS — F0280 Dementia in other diseases classified elsewhere without behavioral disturbance: Secondary | ICD-10-CM | POA: Diagnosis not present

## 2018-04-13 DIAGNOSIS — R269 Unspecified abnormalities of gait and mobility: Secondary | ICD-10-CM | POA: Diagnosis not present

## 2018-04-13 MED ORDER — CARBIDOPA-LEVODOPA 25-100 MG PO TABS: 1.0000 | ORAL_TABLET | Freq: Four times a day (QID) | ORAL | 2 refills | Status: DC

## 2018-04-14 DIAGNOSIS — G20 Parkinson's disease: Secondary | ICD-10-CM | POA: Diagnosis not present

## 2018-04-14 DIAGNOSIS — I110 Hypertensive heart disease with heart failure: Secondary | ICD-10-CM | POA: Diagnosis not present

## 2018-04-14 DIAGNOSIS — R269 Unspecified abnormalities of gait and mobility: Secondary | ICD-10-CM | POA: Diagnosis not present

## 2018-04-14 DIAGNOSIS — R531 Weakness: Secondary | ICD-10-CM | POA: Diagnosis not present

## 2018-04-14 DIAGNOSIS — R001 Bradycardia, unspecified: Secondary | ICD-10-CM | POA: Diagnosis not present

## 2018-04-14 DIAGNOSIS — F0280 Dementia in other diseases classified elsewhere without behavioral disturbance: Secondary | ICD-10-CM | POA: Diagnosis not present

## 2018-04-17 ENCOUNTER — Ambulatory Visit: Payer: Medicare HMO | Admitting: Neurology

## 2018-04-17 ENCOUNTER — Ambulatory Visit (INDEPENDENT_AMBULATORY_CARE_PROVIDER_SITE_OTHER): Payer: Medicare Other | Admitting: Pharmacist

## 2018-04-17 DIAGNOSIS — Z7901 Long term (current) use of anticoagulants: Secondary | ICD-10-CM | POA: Diagnosis not present

## 2018-04-17 DIAGNOSIS — Z7902 Long term (current) use of antithrombotics/antiplatelets: Secondary | ICD-10-CM

## 2018-04-17 DIAGNOSIS — Z86711 Personal history of pulmonary embolism: Secondary | ICD-10-CM

## 2018-04-17 DIAGNOSIS — R269 Unspecified abnormalities of gait and mobility: Secondary | ICD-10-CM | POA: Diagnosis not present

## 2018-04-17 DIAGNOSIS — G20 Parkinson's disease: Secondary | ICD-10-CM | POA: Diagnosis not present

## 2018-04-17 DIAGNOSIS — R531 Weakness: Secondary | ICD-10-CM | POA: Diagnosis not present

## 2018-04-17 DIAGNOSIS — I110 Hypertensive heart disease with heart failure: Secondary | ICD-10-CM | POA: Diagnosis not present

## 2018-04-17 DIAGNOSIS — Z5181 Encounter for therapeutic drug level monitoring: Secondary | ICD-10-CM | POA: Diagnosis not present

## 2018-04-17 DIAGNOSIS — F0280 Dementia in other diseases classified elsewhere without behavioral disturbance: Secondary | ICD-10-CM | POA: Diagnosis not present

## 2018-04-17 DIAGNOSIS — R001 Bradycardia, unspecified: Secondary | ICD-10-CM | POA: Diagnosis not present

## 2018-04-17 LAB — POCT INR: INR: 2.9 (ref 2.0–3.0)

## 2018-04-17 NOTE — Patient Instructions (Signed)
Patient's daughter instructed to give medications as defined in the Anti-coagulation Track section of this encounter.  Patient's daughter instructed to give today's dose.  Patients daughter instructed togive one (1) tablet of his 5mg  peach-colored warfarin every day--EXCEPT on Tuesdays,  Thursdays, Saturdays and Sundays, give only one-half (1/2) tablet of his 5mg  peach-colored warfarin tablets. Patient's daughter verbalized understanding of these instructions.

## 2018-04-17 NOTE — Progress Notes (Signed)
Anticoagulation Management [name] [name] is a 84 y.o. male who reports to the clinic for monitoring of warfarin treatment.    Indication: History of multiple venous embolic episodes; current long term use of anticoagulant; Contine to annually re-evaluate continued need for warfarin weighing risks vs. benefits.    Duration: indefinite Supervising physician: Gilles Chiquito  Anticoagulation Clinic Visit History: Patient does not report signs/symptoms of bleeding or thromboembolism  Other recent changes: No diet, medications, lifestyle changes endorsed by the patient's daughter at this visit.  Anticoagulation Episode Summary    Current INR goal:   2.0-3.0  TTR:   73.4 % (7.3 y)  Next INR check:   05/15/2018  INR from last check:   2.9 (04/17/2018)  Weekly max warfarin dose:     Target end date:   Indefinite  INR check location:   Anticoagulation Clinic  Preferred lab:     Send INR reminders to:   [facility]   Indications   History of pulmonary embolism [Z86.711] Encounter for long-term use of antiplatelets/antithrombotics [Z79.02]       Comments:   History of multiple venous embolic episodes. Will continue to annual re-evaluate continued need for warfarin weighing risks vs. benefits.       Anticoagulation Care Providers    Provider Role Specialty Phone number   Bertha Stakes, MD  Internal Medicine [phone]      Allergies  Allergen Reactions  . Aricept [Donepezil Hcl] Other (See Comments)    Symptomatic Bradycardia.  . Other Other (See Comments)    Shrimp gives him gout  . Shellfish-Derived Products Other (See Comments)    Causes a flare up with Gout   Prior to Admission medications   Medication Sig Start Date End Date Taking? Authorizing Provider  acetaminophen (TYLENOL) 500 MG tablet Take 1,000 mg by mouth 2 (two) times daily.    Yes [provider]  amLODipine (NORVASC) 2.5 MG tablet Take 1 tablet (2.5 mg total) by mouth daily. 11/17/17  Yes Ledell Noss, MD   b complex vitamins tablet Take 1 tablet by mouth every morning.    Yes [provider]  Ca Phosphate-Cholecalciferol (CALCIUM/VITAMIN D3 GUMMIES) 250-350 MG-UNIT CHEW Chew 1 Dose by mouth 2 (two) times daily. 07/08/16  Yes [name], DO  carbidopa-levodopa (SINEMET IR) 25-100 MG tablet Take 1 tablet by mouth 4 (four) times daily. 04/13/18  Yes Annia Belt, MD  fish oil-omega-3 fatty acids 1000 MG capsule Take 2 g by mouth every morning.    Yes [provider]  memantine (NAMENDA XR) 28 MG CP24 24 hr capsule TAKE 1 CAPSULE BY MOUTH EVERY DAY IN THE MORNING 04/05/18  Yes Garvin Fila, MD  Multiple Vitamins-Minerals (MULTIVITAMIN WITH MINERALS) tablet Take 1 tablet by mouth every morning.    Yes [provider]  polyethylene glycol (MIRALAX / GLYCOLAX) packet Take 17 g by mouth daily as needed (constipation).   Yes [provider]  potassium chloride SA (KLOR-CON M20) 20 MEQ tablet Take 2 tablets (40 mEq total) by mouth daily. 09/29/17  Yes [name], DO  QUEtiapine (SEROQUEL) 25 MG tablet TAKE 1 TABLET AT BEDTIME MAY USE 1/2 TABLET DURING THE DAY AS NEEDED FOR AGITATION/HALLUCINATIONS 04/13/18  Yes Garvin Fila, MD  senna (SENOKOT) 8.6 MG TABS tablet Take 1 tablet (8.6 mg total) by mouth daily as needed for mild constipation. 07/13/16  Yes Velna Ochs, MD  vitamin C (ASCORBIC ACID) 500 MG tablet Take 1,000 mg by mouth every morning.  Yes [provider]  warfarin (COUMADIN) 5 MG tablet 2.5 mg (half tab) every Tue, Thu, Sat; 5 mg (full tab) all other days 03/28/18  Yes Axel Filler, MD   Past Medical History:  Diagnosis Date  . Anxiety   . Coronary artery disease, non-occlusive    with history of MI; Cath 2008 w multivessel nonobstructive CAD  . Dementia   . Depression   . GERD (gastroesophageal reflux disease)   . Hyperlipidemia   . Hypertension   . Memory loss   . Parkinson disease ([place])   . PE (pulmonary  embolism)    unprovoked PE completed 6 months of warfarin, warfarin d/ced 02/12/2010, repeat PE 07/10/2010 after a long car ride and now on lifelong coumadin.  . Pituitary microadenoma ([place])    incidental finding CT 12/2009  . Prostate cancer ([place])   . Scoliosis   . Sigmoid volvulus ([place]) 08/02/2016  . Volvulus of colon ([place]) 06/01/2017   Social History   Socioeconomic History  . Marital status: Married    Spouse name: Not on file  . Number of children: 4  . Years of education: master's  . Highest education level: Not on file  Occupational History  . Occupation: Retired    Fish farm manager: RETIRED  Social Needs  . Financial resource strain: Not on file  . Food insecurity:    Worry: Not on file    Inability: Not on file  . Transportation needs:    Medical: Not on file    Non-medical: Not on file  Tobacco Use  . Smoking status: Never Smoker  . Smokeless tobacco: Never Used  Substance and Sexual Activity  . Alcohol use: No    Alcohol/week: 0.0 oz  . Drug use: No  . Sexual activity: Never  Lifestyle  . Physical activity:    Days per week: Not on file    Minutes per session: Not on file  . Stress: Not on file  Relationships  . Social connections:    Talks on phone: Not on file    Gets together: Not on file    Attends religious service: Not on file    Active member of club or organization: Not on file    Attends meetings of clubs or organizations: Not on file    Relationship status: Not on file  Other Topics Concern  . Not on file  Social History Narrative   Lives with daughter. Wife also has severe dementia.    Drinks 1 cup of coffee a day    Family History  Problem Relation Age of Onset  . Hypertension Father        Passed away from cerebral hemorrhage at age of 34.  [name] Dementia Mother   . Hypertension Child        4 adult children  . Cervical cancer Other   . Lung cancer Other   . Stroke Other   . Heart attack Neg Hx     ASSESSMENT Recent Results: The most  recent result is correlated with 27.5 mg per week: Lab Results  Component Value Date   INR 2.9 04/17/2018   INR 1.7 (A) 03/27/2018   INR 2.2 02/27/2018    Anticoagulation Dosing: Description   Give one (1) tablet of his 5mg  peach-colored warfarin every day--EXCEPT on Tuesdays,  Thursdays, Saturdays and Sundays, give only one-half (1/2) tablet of his 5mg  peach-colored warfarin tablets.     INR today: Therapeutic  PLAN Weekly dose was decreased by 9% to 25  mg per week  Patient Instructions  Patient's daughter instructed to give medications as defined in the Anti-coagulation Track section of this encounter.  Patient's daughter instructed to give today's dose.  Patients daughter instructed togive one (1) tablet of his 5mg  peach-colored warfarin every day--EXCEPT on Tuesdays,  Thursdays, Saturdays and Sundays, give only one-half (1/2) tablet of his 5mg  peach-colored warfarin tablets. Patient's daughter verbalized understanding of these instructions.     Patient advised to contact clinic or seek medical attention if signs/symptoms of bleeding or thromboembolism occur.  Patient verbalized understanding by repeating back information and was advised to contact me if further medication-related questions arise. Patient was also provided an information handout.  Follow-up Return in 1 month (on 05/15/2018) for Follow up INR at 4:30PM.  Pennie Banter, PharmD, CACP, CPP  15 minutes spent face-to-face with the patient during the encounter. 50% of time spent on education. 50% of time was spent on fingerstick point of care INR sample collection, processing, results determination, dose adjustment and documentation in [email or web address].

## 2018-04-18 DIAGNOSIS — G20 Parkinson's disease: Secondary | ICD-10-CM | POA: Diagnosis not present

## 2018-04-18 DIAGNOSIS — R001 Bradycardia, unspecified: Secondary | ICD-10-CM | POA: Diagnosis not present

## 2018-04-18 DIAGNOSIS — R531 Weakness: Secondary | ICD-10-CM | POA: Diagnosis not present

## 2018-04-18 DIAGNOSIS — R269 Unspecified abnormalities of gait and mobility: Secondary | ICD-10-CM | POA: Diagnosis not present

## 2018-04-18 DIAGNOSIS — F0280 Dementia in other diseases classified elsewhere without behavioral disturbance: Secondary | ICD-10-CM | POA: Diagnosis not present

## 2018-04-18 DIAGNOSIS — I110 Hypertensive heart disease with heart failure: Secondary | ICD-10-CM | POA: Diagnosis not present

## 2018-04-19 DIAGNOSIS — I110 Hypertensive heart disease with heart failure: Secondary | ICD-10-CM | POA: Diagnosis not present

## 2018-04-19 DIAGNOSIS — F0280 Dementia in other diseases classified elsewhere without behavioral disturbance: Secondary | ICD-10-CM | POA: Diagnosis not present

## 2018-04-19 DIAGNOSIS — R531 Weakness: Secondary | ICD-10-CM | POA: Diagnosis not present

## 2018-04-19 DIAGNOSIS — G20 Parkinson's disease: Secondary | ICD-10-CM | POA: Diagnosis not present

## 2018-04-19 DIAGNOSIS — R269 Unspecified abnormalities of gait and mobility: Secondary | ICD-10-CM | POA: Diagnosis not present

## 2018-04-19 DIAGNOSIS — R001 Bradycardia, unspecified: Secondary | ICD-10-CM | POA: Diagnosis not present

## 2018-04-20 DIAGNOSIS — M1A00X0 Idiopathic chronic gout, unspecified site, without tophus (tophi): Secondary | ICD-10-CM | POA: Diagnosis not present

## 2018-04-20 DIAGNOSIS — F0280 Dementia in other diseases classified elsewhere without behavioral disturbance: Secondary | ICD-10-CM | POA: Diagnosis not present

## 2018-04-20 DIAGNOSIS — R001 Bradycardia, unspecified: Secondary | ICD-10-CM | POA: Diagnosis not present

## 2018-04-20 DIAGNOSIS — Z515 Encounter for palliative care: Secondary | ICD-10-CM | POA: Diagnosis not present

## 2018-04-20 DIAGNOSIS — R159 Full incontinence of feces: Secondary | ICD-10-CM | POA: Diagnosis not present

## 2018-04-20 DIAGNOSIS — R531 Weakness: Secondary | ICD-10-CM | POA: Diagnosis not present

## 2018-04-20 DIAGNOSIS — I110 Hypertensive heart disease with heart failure: Secondary | ICD-10-CM | POA: Diagnosis not present

## 2018-04-20 DIAGNOSIS — R32 Unspecified urinary incontinence: Secondary | ICD-10-CM | POA: Diagnosis not present

## 2018-04-20 DIAGNOSIS — G20 Parkinson's disease: Secondary | ICD-10-CM | POA: Diagnosis not present

## 2018-04-20 DIAGNOSIS — R269 Unspecified abnormalities of gait and mobility: Secondary | ICD-10-CM | POA: Diagnosis not present

## 2018-04-21 DIAGNOSIS — F0280 Dementia in other diseases classified elsewhere without behavioral disturbance: Secondary | ICD-10-CM | POA: Diagnosis not present

## 2018-04-21 DIAGNOSIS — R269 Unspecified abnormalities of gait and mobility: Secondary | ICD-10-CM | POA: Diagnosis not present

## 2018-04-21 DIAGNOSIS — R531 Weakness: Secondary | ICD-10-CM | POA: Diagnosis not present

## 2018-04-21 DIAGNOSIS — R001 Bradycardia, unspecified: Secondary | ICD-10-CM | POA: Diagnosis not present

## 2018-04-21 DIAGNOSIS — I110 Hypertensive heart disease with heart failure: Secondary | ICD-10-CM | POA: Diagnosis not present

## 2018-04-21 DIAGNOSIS — G20 Parkinson's disease: Secondary | ICD-10-CM | POA: Diagnosis not present

## 2018-04-23 NOTE — Progress Notes (Signed)
I reviewed Dr. Gladstone Pih note.  Patient is on [facility] for PE and INR was 2.9 so dose was slightly decreased.

## 2018-04-24 DIAGNOSIS — R531 Weakness: Secondary | ICD-10-CM | POA: Diagnosis not present

## 2018-04-24 DIAGNOSIS — R269 Unspecified abnormalities of gait and mobility: Secondary | ICD-10-CM | POA: Diagnosis not present

## 2018-04-24 DIAGNOSIS — F0280 Dementia in other diseases classified elsewhere without behavioral disturbance: Secondary | ICD-10-CM | POA: Diagnosis not present

## 2018-04-24 DIAGNOSIS — I110 Hypertensive heart disease with heart failure: Secondary | ICD-10-CM | POA: Diagnosis not present

## 2018-04-24 DIAGNOSIS — R001 Bradycardia, unspecified: Secondary | ICD-10-CM | POA: Diagnosis not present

## 2018-04-24 DIAGNOSIS — G20 Parkinson's disease: Secondary | ICD-10-CM | POA: Diagnosis not present

## 2018-04-24 IMAGING — CT CT HEAD W/O CM
2 of 6 series · 11 of 47 positions shown, 13 images · non-contrast
Comparison: 07/27/2015.

CLINICAL DATA: Status post fall. No headache. Stiff neck. Patient
reports chronic anticoagulation history.

EXAM:
CT HEAD WITHOUT CONTRAST
CT CERVICAL SPINE WITHOUT CONTRAST
TECHNIQUE: Multidetector CT imaging of the head and cervical spine was
performed following the standard protocol without intravenous
contrast. Multiplanar CT image reconstructions of the cervical spine
were also generated.

[Series 305: coronals · coronal · 0.39mm/px · 3 of 54 slices shown]
[im 18/54  brain]
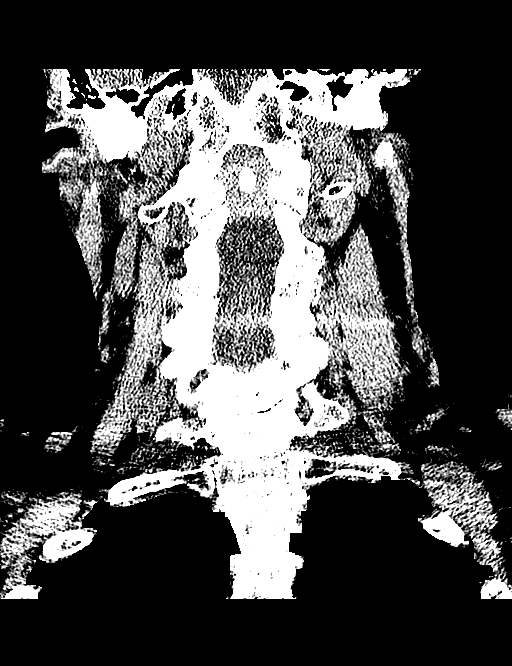
[im 24/54  brain]
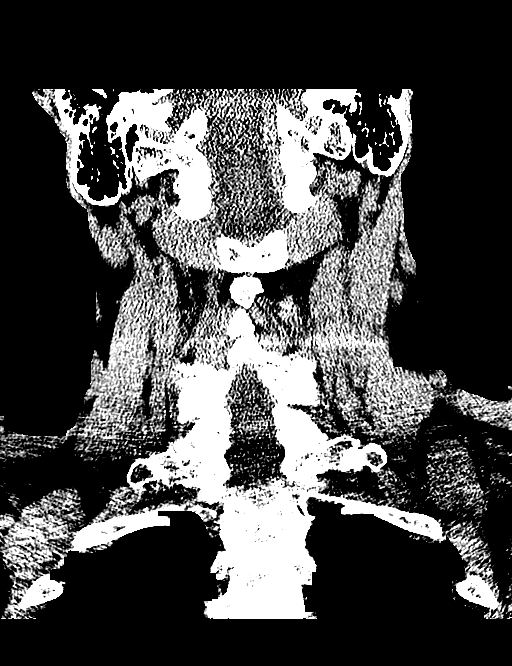
[im 30/54  brain]
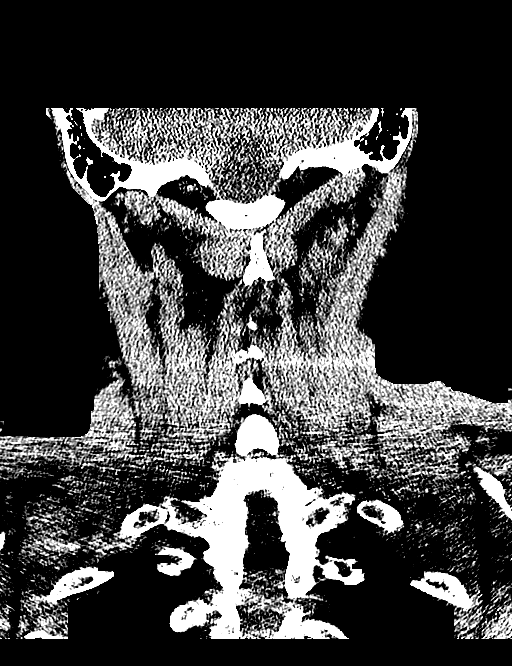

[Series 306: orthogonal · axial · 0.39mm/px · z∈[+94,+225]mm · 8 of 88 slices shown, 10 images]
[im 10/88  brain]
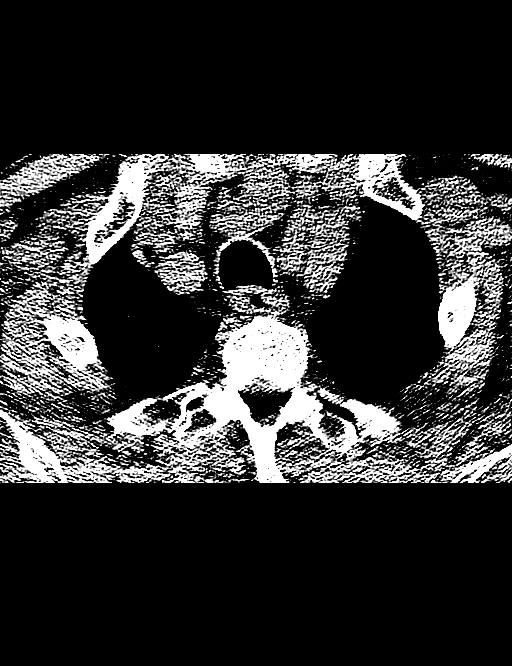
[im 10/88  bone]
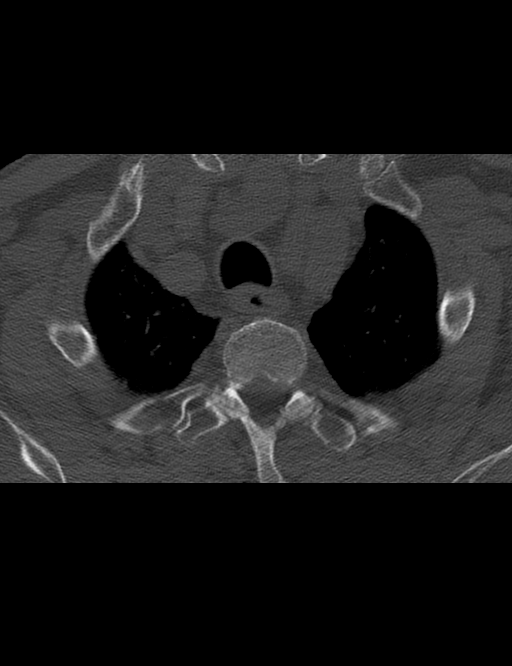
[im 20/88  brain]
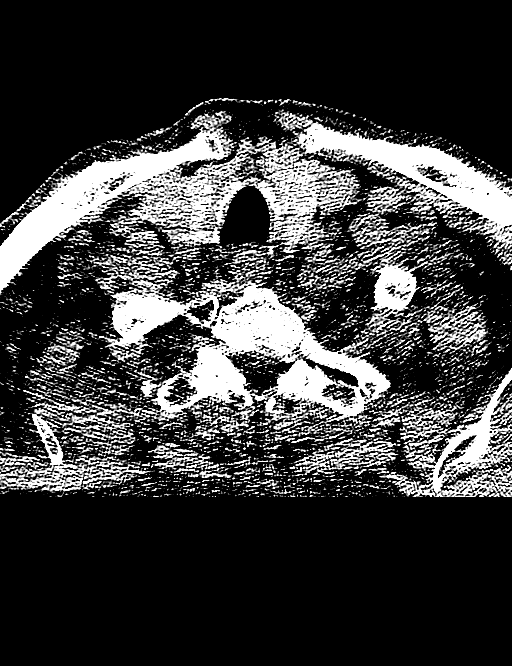
[im 30/88  brain]
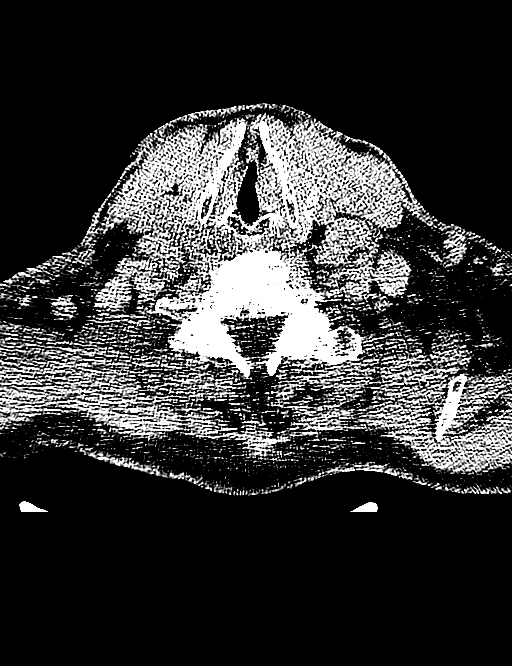
[im 39/88  brain]
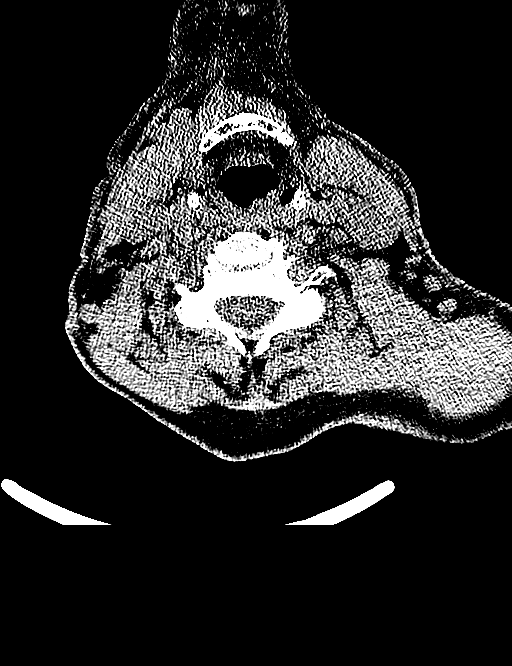
[im 49/88  brain]
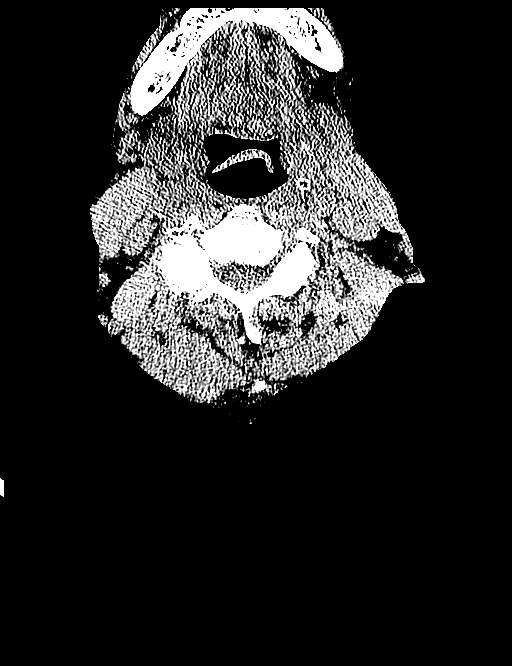
[im 49/88  bone]
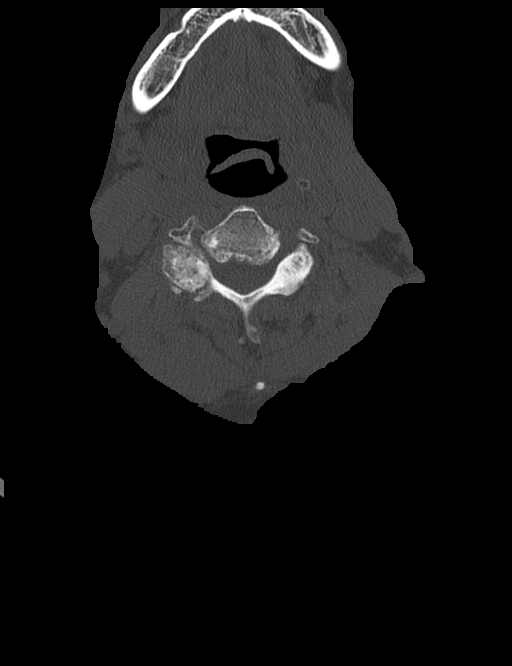
[im 59/88  brain]
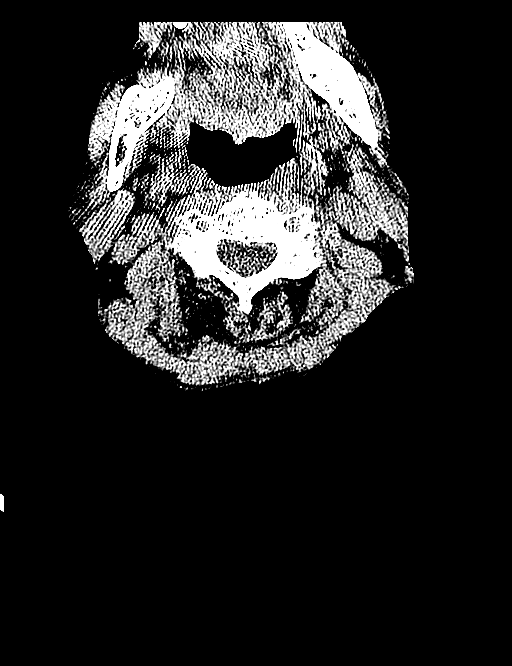
[im 68/88  brain]
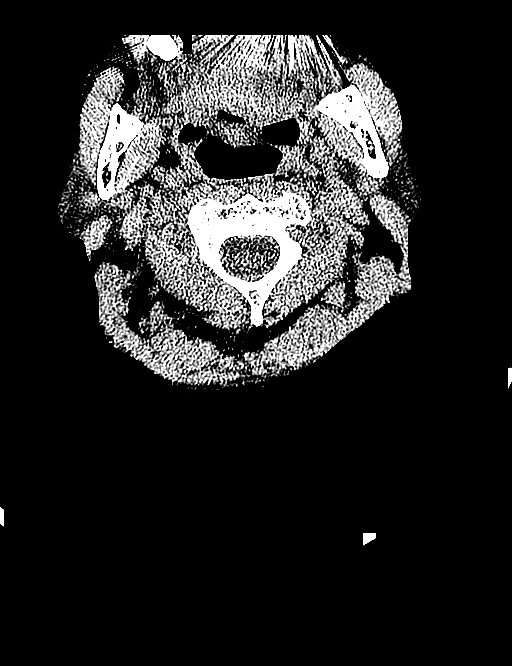
[im 78/88  brain]
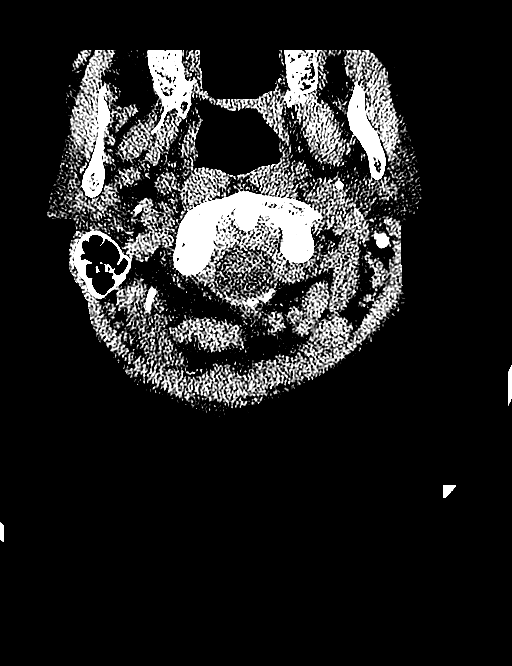

[11 of 47 positions shown; findings below may reference images not displayed]

FINDINGS: CT HEAD FINDINGS

No evidence for acute infarction, hemorrhage, mass lesion,
hydrocephalus, or extra-axial fluid. Global atrophy with chronic
microvascular ischemic change. No skull fracture. Sinuses and
mastoids show no air-fluid level. Moderate vascular calcification in
the skull base carotid segments. Similar appearance to priors.

CT CERVICAL SPINE FINDINGS

There is no visible cervical spine fracture, traumatic subluxation,
prevertebral soft tissue swelling, or intraspinal hematoma.
Spontaneous arthrodesis due to degenerative disc disease C3-C4.
Severe disc space narrowing C6-C7. Facet mediated anterolisthesis
C5-6 of 3 mm, contributing to stenosis at this level and C6-7.

Lung apices clear.  No neck masses.
IMPRESSION: Global atrophy with chronic microvascular ischemic change. No skull
fracture or intracranial hemorrhage.

No cervical spine fracture or traumatic subluxation.

## 2018-04-25 DIAGNOSIS — R269 Unspecified abnormalities of gait and mobility: Secondary | ICD-10-CM | POA: Diagnosis not present

## 2018-04-25 DIAGNOSIS — R001 Bradycardia, unspecified: Secondary | ICD-10-CM | POA: Diagnosis not present

## 2018-04-25 DIAGNOSIS — G20 Parkinson's disease: Secondary | ICD-10-CM | POA: Diagnosis not present

## 2018-04-25 DIAGNOSIS — R531 Weakness: Secondary | ICD-10-CM | POA: Diagnosis not present

## 2018-04-25 DIAGNOSIS — F0280 Dementia in other diseases classified elsewhere without behavioral disturbance: Secondary | ICD-10-CM | POA: Diagnosis not present

## 2018-04-25 DIAGNOSIS — I110 Hypertensive heart disease with heart failure: Secondary | ICD-10-CM | POA: Diagnosis not present

## 2018-04-26 DIAGNOSIS — R269 Unspecified abnormalities of gait and mobility: Secondary | ICD-10-CM | POA: Diagnosis not present

## 2018-04-26 DIAGNOSIS — I110 Hypertensive heart disease with heart failure: Secondary | ICD-10-CM | POA: Diagnosis not present

## 2018-04-26 DIAGNOSIS — F0280 Dementia in other diseases classified elsewhere without behavioral disturbance: Secondary | ICD-10-CM | POA: Diagnosis not present

## 2018-04-26 DIAGNOSIS — R531 Weakness: Secondary | ICD-10-CM | POA: Diagnosis not present

## 2018-04-26 DIAGNOSIS — R001 Bradycardia, unspecified: Secondary | ICD-10-CM | POA: Diagnosis not present

## 2018-04-26 DIAGNOSIS — G20 Parkinson's disease: Secondary | ICD-10-CM | POA: Diagnosis not present

## 2018-04-27 DIAGNOSIS — F0280 Dementia in other diseases classified elsewhere without behavioral disturbance: Secondary | ICD-10-CM | POA: Diagnosis not present

## 2018-04-27 DIAGNOSIS — R001 Bradycardia, unspecified: Secondary | ICD-10-CM | POA: Diagnosis not present

## 2018-04-27 DIAGNOSIS — G20 Parkinson's disease: Secondary | ICD-10-CM | POA: Diagnosis not present

## 2018-04-27 DIAGNOSIS — I110 Hypertensive heart disease with heart failure: Secondary | ICD-10-CM | POA: Diagnosis not present

## 2018-04-27 DIAGNOSIS — R531 Weakness: Secondary | ICD-10-CM | POA: Diagnosis not present

## 2018-04-27 DIAGNOSIS — R269 Unspecified abnormalities of gait and mobility: Secondary | ICD-10-CM | POA: Diagnosis not present

## 2018-04-27 NOTE — Telephone Encounter (Signed)
Opened in Error.

## 2018-04-28 DIAGNOSIS — F0280 Dementia in other diseases classified elsewhere without behavioral disturbance: Secondary | ICD-10-CM | POA: Diagnosis not present

## 2018-04-28 DIAGNOSIS — R531 Weakness: Secondary | ICD-10-CM | POA: Diagnosis not present

## 2018-04-28 DIAGNOSIS — R269 Unspecified abnormalities of gait and mobility: Secondary | ICD-10-CM | POA: Diagnosis not present

## 2018-04-28 DIAGNOSIS — R001 Bradycardia, unspecified: Secondary | ICD-10-CM | POA: Diagnosis not present

## 2018-04-28 DIAGNOSIS — I110 Hypertensive heart disease with heart failure: Secondary | ICD-10-CM | POA: Diagnosis not present

## 2018-04-28 DIAGNOSIS — G20 Parkinson's disease: Secondary | ICD-10-CM | POA: Diagnosis not present

## 2018-04-28 NOTE — Progress Notes (Signed)
Appt sch on 05/15/2018

## 2018-05-01 DIAGNOSIS — R269 Unspecified abnormalities of gait and mobility: Secondary | ICD-10-CM | POA: Diagnosis not present

## 2018-05-01 DIAGNOSIS — R001 Bradycardia, unspecified: Secondary | ICD-10-CM | POA: Diagnosis not present

## 2018-05-01 DIAGNOSIS — I110 Hypertensive heart disease with heart failure: Secondary | ICD-10-CM | POA: Diagnosis not present

## 2018-05-01 DIAGNOSIS — R531 Weakness: Secondary | ICD-10-CM | POA: Diagnosis not present

## 2018-05-01 DIAGNOSIS — G20 Parkinson's disease: Secondary | ICD-10-CM | POA: Diagnosis not present

## 2018-05-01 DIAGNOSIS — F0280 Dementia in other diseases classified elsewhere without behavioral disturbance: Secondary | ICD-10-CM | POA: Diagnosis not present

## 2018-05-02 ENCOUNTER — Ambulatory Visit: Payer: Medicare HMO | Admitting: Neurology

## 2018-05-02 ENCOUNTER — Encounter: Payer: Self-pay | Admitting: Neurology

## 2018-05-02 VITALS — BP 117/80 | HR 70

## 2018-05-02 DIAGNOSIS — G214 Vascular parkinsonism: Secondary | ICD-10-CM | POA: Diagnosis not present

## 2018-05-02 DIAGNOSIS — R531 Weakness: Secondary | ICD-10-CM | POA: Diagnosis not present

## 2018-05-02 DIAGNOSIS — R001 Bradycardia, unspecified: Secondary | ICD-10-CM | POA: Diagnosis not present

## 2018-05-02 DIAGNOSIS — F0280 Dementia in other diseases classified elsewhere without behavioral disturbance: Secondary | ICD-10-CM | POA: Diagnosis not present

## 2018-05-02 DIAGNOSIS — R269 Unspecified abnormalities of gait and mobility: Secondary | ICD-10-CM | POA: Diagnosis not present

## 2018-05-02 DIAGNOSIS — G20 Parkinson's disease: Secondary | ICD-10-CM | POA: Diagnosis not present

## 2018-05-02 DIAGNOSIS — I110 Hypertensive heart disease with heart failure: Secondary | ICD-10-CM | POA: Diagnosis not present

## 2018-05-02 MED ORDER — CARBIDOPA-LEVODOPA 25-100 MG PO TABS: 1.0000 | ORAL_TABLET | Freq: Four times a day (QID) | ORAL | 2 refills | Status: DC

## 2018-05-02 MED ORDER — MEMANTINE HCL ER 28 MG PO CP24: ORAL_CAPSULE | ORAL | 11 refills | Status: DC

## 2018-05-02 NOTE — Progress Notes (Signed)
[name] DOB: 1931-05-05  REASON FOR VISIT: routine follow up for Alzheimer's and Parkinson's HISTORY FROM: patient  HISTORY OF PRESENT ILLNESS: UPDATE 01/23/14 (LL):  MMSE today is 23/30, AFT 9 and Clock drawing 1/4. He is tolerating Sinemet tid and Namenda well.  Mood is better on Zoloft. Having difficulty sleeping.  Does not nap during the day.  Has difficulty getting to sleep and wakes frequently.  Has tried Melatonin in the past without benefit.  He used Nortriptyline in the past for insomnia and pain, but at 100 mg dose became toxic (unresponsive).  Daughter notices "sundowning" at night, more confused in nighttime hours.   UPDATE 06/23/13 (LL): [name] comes back for 2 month revisit. Last visit he was started on Sinemet to see if he would have any benefit. Both he and his daughter think that he is moving better since on it. Daughter states that he is no longer "freezing" when he tries to take a step. He is currently taking it twice a day, once when he gets up (around 7-8) and then again at 5 pm. He has no new complaints. MMSE today is 27/30, AFT 8 and Clock drawing 1/4.   UPDATE 05/24/13 (LL): Patient returns for followup. After starting Aricept at last visit daughter reports that he became very weak, confused, and had to be hospitalized. Was found to be bradycardic and symptomatic with heart rate in the 40s, attributable to the Aricept. Medication was stopped and patient was discharged to rehabilitation for short time and has since returned home. Daughter states that he has adjusted somewhat to having moved in with her, is taking Zoloft for depression, and has disinterest in getting out of the house much. States that sometimes it is hard for him to pick up his foot to take a step and it seems to be when he is nervous. Also seems to have tremor when nervous. Has had trouble with insomnia was started on melatonin 5 mg at bedtime with no response. Daughter feels like he has  improved some in his cognitive ability since last visit. MMSE last visit was 23/30. MMSE today is 25/30 with deficits in recall. Clock drawing 0/4. AFT 10. Geriatric depression score is 3 does not suggest depression.   PRIOR HPI 02/05/13 (PS): 80 male with remote right hemispheric TIA in May 2011 and mild cognitive impairment. He returns today for followup after his last visit with me on 01/05/2012. Continues to do well from a neurovascular standpoint and has not had a any further stroke or TIAs. He remains on warfarin and is tolerating it well without significant bleeding, bruising or the side effects. He states his blood pressure is under good control and it is 124/76 in office today. He however is had increasing confusion and memory difficulties for the last 1 year which have been progressive. He now needs help with taking his medications. He has stopped going out. He in fact wandered out a few weeks ago from his home and needed help to get back. He denies any agitation or caval changes. He has no significant hallucination or other lesions. His gait and balance are fine and there have been no safety issues. He has now moved and to live with his daughter for the last 2-3 months. The patient did undergo labs for treatable causes of cognitive impairment at last visit and vitamin B12, TSH and RPR were normal on 01/05/2012. His mother did have Alzheimer's.  Update 10/23/2014 : He returns for follow-up after  last visit 9 months ago. He is accompanied by his daughter who provides most of the history. Patient feels he is about the same and the daughter agrees. He continues to have poor short-term memory and needs help with activities like his medications and food. He can dress himself and uses a toilet on most occasions with only occasional help. He sometimes has trouble walking and states that his legs don't move but once he gets started he can walk fairly well. He walks with a stooped posture but his balance is not bad  and he is not had any recent falls. He remains on Namenda XR 28 mg daily which he is tolerating well. He had been on Aricept for years but developed slow heart rate requiring it to be stopped. He has had no interval health problems. He hasn't not been having any significant hallucinations, delusions, agitation or safety concerns. He does get anxious occasionally. The patient's other daughter wants him and his wife to come and stay with her for 3 months and the family is contemplating whether that would be a good move or not Update 04/23/2015 : He returns for follow-up after last visit 6 months ago. He is accompanied by his daughter. The patient was hospitalized in March with syncopal episodes with hypertension and low heart rate. His blood pressure medications were adjusted and tapered and lisinopril and Lasix were discontinued. He undergo a heart monitor for a few days. He is doing better now but blood pressure running in the 102H systolic range. However his been feeling weak all over as has not been walking a lot. He has not been using his walker. He feels his leg is slow and stiff and stick to the ground. He is walking difficulty to the more pronounced when is anxious. He has noticed previously some benefit after adding Sinemet which he takes 25/100 one tablet 3 times daily. Daughter has not noticed any significant further cognitive decline however on Mini-Mental status exam testing today she scored 17 out of 30 which is a significant drop from  22/30 at last visit. He remains on Namenda etc. and in the past has not been able to tolerate Aricept due to his heart rate being slow . Update 09/23/2015 : He returns for follow-up with his last visit with me 5 months ago. He is accompanied by his daughter who provides most of the history. Patient continues to have cognitive decline as well as problems with gait and balance. He remains on Sinemet which he takes in the morning when he gets up as well as close to bedtime.  His daughter feels that towards the evening time his legs dragging and he is unable to move them as well and perhaps he should take the evening dose earlier. He is also had increasing agitation at night and not being able to sleep and he does Nap during the day a lot. He was unable to afford Namenda etc. due to being in the doughnut hole and when he was switched to generic Namenda 10 mg twice daily he seems not to have done as well. On Mini-Mental status score testing today score 9/30 which is a significant decline from 17/30 at last visit. He was seen by Dr. 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 The patient was tried on trazodone but it made him too sleepy during the day and he took only 1 dose. Patient  has recently started home physical therapy. The patient's daughter seems extremely committed to keeping him at home and is looking at options to hire a caregiver Update 03/24/2016 ; he returns for follow-up after last visit 6 months ago. He is accompanied by his son and 2 daughters. Patient congested do poorly and family has noticed worsening of his gait and balance. His shuffling a lot more now and needs a walker and close supervision. He is had a few falls but fortunately not hurt himself. Patient had agitation at night and family had called earlier and hadn't increased her dose of Seroquel to 25 mg at night which works off and on. The family does admit that his sleep-wake cycle may be off and he does sleep a lot during the day and then have trouble at night. Patient now has 24-hour care at home. He was hospitalized in March this year with intestinal obstruction and following surgery is still not having regular bowel movements. The family is applied for more nursing help at home and are requesting a letter of medical necessity from me. He does get confused more frequently now  particularly at night that is sundowning. There have been no definite no delusions or hallucinations  or unsafe behavior Update 09/23/2016 : He returns for follow-up after last 6 months ago. He is had significant general medical decline in his condition. He has been hospitalized multiple times with urine tract infection as well as sigmoid volvulus and pulmonary embolism as well as hypokalemia. He requires essential. Would at home. He continues to stay at home with his daughter. He has nursing help during the day while the daughter is working but that night the daughter manages by herself. She has some minimum support from her brother as well as start her. Patient was switched to Namenda 10 mg twice daily upon request from the daughters should he was in the doughnut hole and could not afford it but daughter feels that Namenda etc. work better and she wants him to be switch back to it. Patient didn't not take Neupro patch which I ordered at last visit as he could not afford it. He is on Sinemet immediate release 1 tablet 4 times daily. He sleeps through the night and Seroquel at night seems to help his agitation and sleep as well. There have been no falls or injuries. Update 04/05/2017 : He returns for follow-up after last visit 6 months ago. He is accompanied by his daughter. The patient seems to be doing slightly better after starting Namenda 5 mg twice daily which is tolerating well. Is living at home with his daughter. He is able to walk indoors using a walker but does use a wheelchair for long distances. He still needs a two-person assist when is walking with one person standing in front of him and one behind them. He has a tendency to fall but has fortunately not had any falls in the last 6 months. He does have good days and bad days and is usually slow to respond to questions and speaks in a soft voice. He does get agitated intermittently but can be redirected when spoken to. He also has visual hallucinations  and sees things which are not that but can be reasoned with. He does take several core 25 mg at night and sleeps quite well. He does take Sinemet which seems to help with his drooling and mobility. He has no new complaints. Update 05/02/2018 : He returns for follow-up last visit a year ago. He  is accompanied by his daughter. Patient seems to be doing about the same from cognitive as well as movement disorder standpoint. He remains on Namenda X R 28 mg daily and Sinemet 25/100 mg 1 tablet 4 times daily. He is total care and spends most of his time in a wheelchair. He is able to walk short distances with a one person assist. He speaks in a soft voice but his speech is limited and intermittent. He did have some elevated blood pressure in February this year but now seems to be running low blood pressure. The daughter has noticed he has occasional swallowing difficulties. The daughter tries to supplement his nutrition by giving him pure food and protein powder and ensure. The patient is getting palliative care services at home and a CNA visits him from Monday to Friday. Patient needs total care and help with all activities of daily living. The patient's lost his mother recently and is willing to do whatever it takes to continue to provide for her father including considering a PEG tube if his swallowing difficulty gets worse REVIEW OF SYSTEMS: Full 14 system review of systems performed and notable only for:  Memory loss, anxiety, nervousness, gait difficulty  and all other systems negative   ALLERGIES: Allergies  Allergen Reactions  . Aricept [Donepezil Hcl] Other (See Comments)    Symptomatic Bradycardia.  . Other Other (See Comments)    Shrimp gives him gout  . Shellfish-Derived Products Other (See Comments)    Causes a flare up with Gout    HOME MEDICATIONS: Outpatient Medications Prior to Visit  Medication Sig Dispense Refill  . acetaminophen (TYLENOL) 500 MG tablet Take 1,000 mg by mouth 2 (two)  times daily.     [name] amLODipine (NORVASC) 2.5 MG tablet Take 1 tablet (2.5 mg total) by mouth daily. 90 tablet 1  . b complex vitamins tablet Take 1 tablet by mouth every morning.     . Ca Phosphate-Cholecalciferol (CALCIUM/VITAMIN D3 GUMMIES) 250-350 MG-UNIT CHEW Chew 1 Dose by mouth 2 (two) times daily. 180 tablet 3  . fish oil-omega-3 fatty acids 1000 MG capsule Take 2 g by mouth every morning.     . Multiple Vitamins-Minerals (MULTIVITAMIN WITH MINERALS) tablet Take 1 tablet by mouth every morning.     . polyethylene glycol (MIRALAX / GLYCOLAX) packet Take 17 g by mouth daily as needed (constipation).    . potassium chloride SA (KLOR-CON M20) 20 MEQ tablet Take 2 tablets (40 mEq total) by mouth daily. 180 tablet 1  . QUEtiapine (SEROQUEL) 25 MG tablet TAKE 1 TABLET AT BEDTIME MAY USE 1/2 TABLET DURING THE DAY AS NEEDED FOR AGITATION/HALLUCINATIONS 90 tablet 1  . senna (SENOKOT) 8.6 MG TABS tablet Take 1 tablet (8.6 mg total) by mouth daily as needed for mild constipation. 120 each 0  . vitamin C (ASCORBIC ACID) 500 MG tablet Take 1,000 mg by mouth every morning.     . warfarin (COUMADIN) 5 MG tablet 2.5 mg (half tab) every Tue, Thu, Sat; 5 mg (full tab) all other days 30 tablet 3  . carbidopa-levodopa (SINEMET IR) 25-100 MG tablet Take 1 tablet by mouth 4 (four) times daily. 120 tablet 2  . memantine (NAMENDA XR) 28 MG CP24 24 hr capsule TAKE 1 CAPSULE BY MOUTH EVERY DAY IN THE MORNING 30 capsule 11   No facility-administered medications prior to visit.    PHYSICAL EXAM  Vitals:   05/02/18 1124  BP: 117/80  Pulse: 70   There is no  height or weight on file to calculate BMI.  Generalized: pleasant  Frail elderly AA male,  In no acute distress  Neck: Supple, no carotid bruits  Cardiac: Irregular rate and rhythm, soft 1/6 murmur  Pulmonary: Clear to auscultation bilaterally  Musculoskeletal: mild kyphosis   Neurological examination  MMSE history: 09/23/15 MMSE 9/30 04/23/2015 : MMSE  17/30 10/23/14 MMSE 22/30, AFT 11, Clock 1/4, GDS 5 01/23/14:   MMSE 23/30, recall 1/3, AFT 9 and Clock Drawing 1/4. 06/23/13:  MMSE 27/30, recall 3/3, AFT 8 and Clock drawing 1/4. GDS 5. 02/05/13:  MMSE 25/30, recall 1/3, AFT 10, Clock drawing 0/4. GDS 3.  Mentation: Alert, language fluent.Speech hypophonic and nonfluent Mini-Mental status exam  Not done today. Deficits in orientation, attention, recall and following three-step commands. Animal naming unable. Clock drawing  unable. Voice is very soft and can barely be heard Mood and affect appropriate. Last traces. Positive glabellar tap. Grasp reflex negative.  Cranial nerve II-XII: Pupils were equal round reactive to light extraocular movements were full, except for restricted upgaze visual field were full on confrontational test. facial sensation and strength were normal. hearing was intact to finger rubbing bilaterally. Uvula tongue midline.   MOTOR: normal bulk and tone, full strength in the BUE, BLE, fine finger movements normal, no pronator drift. MILD COGWHEEL RIGIDIITY IN ARMS AND WRISTS, L>R.  SENSORY: normal and symmetric to light touch  COORDINATION: Normal finger-nose-finger, heel-to-shin bilaterally.  REFLEXES: 1+ and symmetric.  [name]  GAIT/STATION: Deferred as patient did not bring his walker   ASSESSMENT AND PLAN 84 year old AA male patient with   moderate dementia. 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 Remote history of right brain TIA in May 2011 and stable from a neurovascular standpoint.  Vascular Parkinsonism  . PLAN:  I had a long discussion with the patient with regards to his vascular dementia, parkinsonism and remote stroke and answered questions. Continue Sinemet in the current dose of 25/100 mg 1 tablet 4 times a day. Continue Namenda XR 28 mg daily for dementia. Continue Seroquel 25 mg at night for sleep and patient may use 12.5 mg dose during the daytime for agitation/hallucinations if necessary. Continue to use a walker  and close supervision while walking to avoid falls and injuries. Continue Coumadin for stroke prevention Patient needs total care 24 hours 7 days a week. I also advised her to discuss with primary care physician palliative care and possible feeding tube if his swallowing difficulties related it further. He will return for follow-up in a year or call earlier if necessary Greater than 50% time during this 25 minute visit was spent on counseling and coordination of care about his dementia, parkinsonism and stroke    [name], MD  05/02/2018, 12:01 PM [facility] [address], [place], [place] [address] [phone]  Note: This document was prepared with digital dictation and possible smart phrase technology. Any transcriptional errors that result from this process are unintentional.

## 2018-05-02 NOTE — Patient Instructions (Signed)
I had a long discussion with the patient and his daughter regarding his advanced dementia and parkinsonism and answered questions. Continue Sinemet and the current dose of 25/100 mg 1 tablet 4 times daily and Namenda XR 28 mg daily. Patient needs total care 24 hours 7 days a week. I also advised her to discuss with primary care physician palliative care and possible feeding tube if his swallowing difficulties related it further. He will return for follow-up in a year or call earlier if necessary

## 2018-05-03 DIAGNOSIS — I110 Hypertensive heart disease with heart failure: Secondary | ICD-10-CM | POA: Diagnosis not present

## 2018-05-03 DIAGNOSIS — R531 Weakness: Secondary | ICD-10-CM | POA: Diagnosis not present

## 2018-05-03 DIAGNOSIS — R001 Bradycardia, unspecified: Secondary | ICD-10-CM | POA: Diagnosis not present

## 2018-05-03 DIAGNOSIS — G20 Parkinson's disease: Secondary | ICD-10-CM | POA: Diagnosis not present

## 2018-05-03 DIAGNOSIS — F0280 Dementia in other diseases classified elsewhere without behavioral disturbance: Secondary | ICD-10-CM | POA: Diagnosis not present

## 2018-05-03 DIAGNOSIS — R269 Unspecified abnormalities of gait and mobility: Secondary | ICD-10-CM | POA: Diagnosis not present

## 2018-05-04 DIAGNOSIS — I110 Hypertensive heart disease with heart failure: Secondary | ICD-10-CM | POA: Diagnosis not present

## 2018-05-04 DIAGNOSIS — R269 Unspecified abnormalities of gait and mobility: Secondary | ICD-10-CM | POA: Diagnosis not present

## 2018-05-04 DIAGNOSIS — G20 Parkinson's disease: Secondary | ICD-10-CM | POA: Diagnosis not present

## 2018-05-04 DIAGNOSIS — F0280 Dementia in other diseases classified elsewhere without behavioral disturbance: Secondary | ICD-10-CM | POA: Diagnosis not present

## 2018-05-04 DIAGNOSIS — R001 Bradycardia, unspecified: Secondary | ICD-10-CM | POA: Diagnosis not present

## 2018-05-04 DIAGNOSIS — R531 Weakness: Secondary | ICD-10-CM | POA: Diagnosis not present

## 2018-05-05 ENCOUNTER — Encounter (HOSPITAL_COMMUNITY): Payer: Self-pay

## 2018-05-05 ENCOUNTER — Inpatient Hospital Stay (HOSPITAL_COMMUNITY)
Admission: EM | Admit: 2018-05-05 | Discharge: 2018-05-12 | DRG: 064 | Disposition: A | Discharge status: Skilled Nursing Facility | Payer: Medicare Other | Attending: Neurology | Admitting: Neurology

## 2018-05-05 ENCOUNTER — Other Ambulatory Visit: Payer: Self-pay

## 2018-05-05 ENCOUNTER — Encounter: Payer: Self-pay | Admitting: Internal Medicine

## 2018-05-05 ENCOUNTER — Emergency Department (HOSPITAL_COMMUNITY): Payer: Medicare Other

## 2018-05-05 DIAGNOSIS — R1310 Dysphagia, unspecified: Secondary | ICD-10-CM

## 2018-05-05 DIAGNOSIS — R402362 Coma scale, best motor response, obeys commands, at arrival to emergency department: Secondary | ICD-10-CM | POA: Diagnosis present

## 2018-05-05 DIAGNOSIS — I2510 Atherosclerotic heart disease of native coronary artery without angina pectoris: Secondary | ICD-10-CM | POA: Diagnosis not present

## 2018-05-05 DIAGNOSIS — R7303 Prediabetes: Secondary | ICD-10-CM

## 2018-05-05 DIAGNOSIS — E785 Hyperlipidemia, unspecified: Secondary | ICD-10-CM | POA: Diagnosis present

## 2018-05-05 DIAGNOSIS — R001 Bradycardia, unspecified: Secondary | ICD-10-CM | POA: Diagnosis not present

## 2018-05-05 DIAGNOSIS — Z7982 Long term (current) use of aspirin: Secondary | ICD-10-CM

## 2018-05-05 DIAGNOSIS — M6281 Muscle weakness (generalized): Secondary | ICD-10-CM | POA: Diagnosis not present

## 2018-05-05 DIAGNOSIS — F329 Major depressive disorder, single episode, unspecified: Secondary | ICD-10-CM | POA: Diagnosis present

## 2018-05-05 DIAGNOSIS — I619 Nontraumatic intracerebral hemorrhage, unspecified: Secondary | ICD-10-CM | POA: Diagnosis not present

## 2018-05-05 DIAGNOSIS — K219 Gastro-esophageal reflux disease without esophagitis: Secondary | ICD-10-CM | POA: Diagnosis present

## 2018-05-05 DIAGNOSIS — J9811 Atelectasis: Secondary | ICD-10-CM | POA: Diagnosis not present

## 2018-05-05 DIAGNOSIS — F0280 Dementia in other diseases classified elsewhere without behavioral disturbance: Secondary | ICD-10-CM | POA: Diagnosis not present

## 2018-05-05 DIAGNOSIS — R531 Weakness: Secondary | ICD-10-CM | POA: Diagnosis not present

## 2018-05-05 DIAGNOSIS — Z91013 Allergy to seafood: Secondary | ICD-10-CM

## 2018-05-05 DIAGNOSIS — I5032 Chronic diastolic (congestive) heart failure: Secondary | ICD-10-CM | POA: Diagnosis present

## 2018-05-05 DIAGNOSIS — R402142 Coma scale, eyes open, spontaneous, at arrival to emergency department: Secondary | ICD-10-CM | POA: Diagnosis present

## 2018-05-05 DIAGNOSIS — I69291 Dysphagia following other nontraumatic intracranial hemorrhage: Secondary | ICD-10-CM | POA: Diagnosis not present

## 2018-05-05 DIAGNOSIS — R451 Restlessness and agitation: Secondary | ICD-10-CM

## 2018-05-05 DIAGNOSIS — R2681 Unsteadiness on feet: Secondary | ICD-10-CM | POA: Diagnosis not present

## 2018-05-05 DIAGNOSIS — R488 Other symbolic dysfunctions: Secondary | ICD-10-CM | POA: Diagnosis not present

## 2018-05-05 DIAGNOSIS — T45515A Adverse effect of anticoagulants, initial encounter: Secondary | ICD-10-CM | POA: Diagnosis present

## 2018-05-05 DIAGNOSIS — Z8546 Personal history of malignant neoplasm of prostate: Secondary | ICD-10-CM

## 2018-05-05 DIAGNOSIS — G309 Alzheimer's disease, unspecified: Secondary | ICD-10-CM

## 2018-05-05 DIAGNOSIS — R29706 NIHSS score 6: Secondary | ICD-10-CM | POA: Diagnosis present

## 2018-05-05 DIAGNOSIS — G301 Alzheimer's disease with late onset: Secondary | ICD-10-CM | POA: Diagnosis not present

## 2018-05-05 DIAGNOSIS — I2583 Coronary atherosclerosis due to lipid rich plaque: Secondary | ICD-10-CM | POA: Diagnosis not present

## 2018-05-05 DIAGNOSIS — D638 Anemia in other chronic diseases classified elsewhere: Secondary | ICD-10-CM | POA: Diagnosis not present

## 2018-05-05 DIAGNOSIS — R0902 Hypoxemia: Secondary | ICD-10-CM | POA: Diagnosis not present

## 2018-05-05 DIAGNOSIS — G20 Parkinson's disease: Secondary | ICD-10-CM | POA: Diagnosis not present

## 2018-05-05 DIAGNOSIS — D649 Anemia, unspecified: Secondary | ICD-10-CM | POA: Diagnosis not present

## 2018-05-05 DIAGNOSIS — R0602 Shortness of breath: Secondary | ICD-10-CM | POA: Diagnosis not present

## 2018-05-05 DIAGNOSIS — D6832 Hemorrhagic disorder due to extrinsic circulating anticoagulants: Secondary | ICD-10-CM | POA: Diagnosis present

## 2018-05-05 DIAGNOSIS — I252 Old myocardial infarction: Secondary | ICD-10-CM

## 2018-05-05 DIAGNOSIS — Z888 Allergy status to other drugs, medicaments and biological substances status: Secondary | ICD-10-CM

## 2018-05-05 DIAGNOSIS — M1A00X0 Idiopathic chronic gout, unspecified site, without tophus (tophi): Secondary | ICD-10-CM | POA: Diagnosis not present

## 2018-05-05 DIAGNOSIS — Z7401 Bed confinement status: Secondary | ICD-10-CM | POA: Diagnosis not present

## 2018-05-05 DIAGNOSIS — R278 Other lack of coordination: Secondary | ICD-10-CM | POA: Diagnosis not present

## 2018-05-05 DIAGNOSIS — I610 Nontraumatic intracerebral hemorrhage in hemisphere, subcortical: Secondary | ICD-10-CM | POA: Diagnosis not present

## 2018-05-05 DIAGNOSIS — I7389 Other specified peripheral vascular diseases: Secondary | ICD-10-CM | POA: Diagnosis present

## 2018-05-05 DIAGNOSIS — Z79899 Other long term (current) drug therapy: Secondary | ICD-10-CM

## 2018-05-05 DIAGNOSIS — E875 Hyperkalemia: Secondary | ICD-10-CM | POA: Diagnosis present

## 2018-05-05 DIAGNOSIS — R231 Pallor: Secondary | ICD-10-CM | POA: Diagnosis not present

## 2018-05-05 DIAGNOSIS — G92 Toxic encephalopathy: Secondary | ICD-10-CM | POA: Diagnosis present

## 2018-05-05 DIAGNOSIS — I69391 Dysphagia following cerebral infarction: Secondary | ICD-10-CM | POA: Diagnosis not present

## 2018-05-05 DIAGNOSIS — G20A1 Parkinson's disease without dyskinesia, without mention of fluctuations: Secondary | ICD-10-CM | POA: Diagnosis present

## 2018-05-05 DIAGNOSIS — D352 Benign neoplasm of pituitary gland: Secondary | ICD-10-CM | POA: Diagnosis present

## 2018-05-05 DIAGNOSIS — R269 Unspecified abnormalities of gait and mobility: Secondary | ICD-10-CM | POA: Diagnosis not present

## 2018-05-05 DIAGNOSIS — Z7901 Long term (current) use of anticoagulants: Secondary | ICD-10-CM

## 2018-05-05 DIAGNOSIS — I614 Nontraumatic intracerebral hemorrhage in cerebellum: Secondary | ICD-10-CM | POA: Diagnosis not present

## 2018-05-05 DIAGNOSIS — I10 Essential (primary) hypertension: Secondary | ICD-10-CM

## 2018-05-05 DIAGNOSIS — R110 Nausea: Secondary | ICD-10-CM | POA: Diagnosis not present

## 2018-05-05 DIAGNOSIS — I509 Heart failure, unspecified: Secondary | ICD-10-CM | POA: Diagnosis not present

## 2018-05-05 DIAGNOSIS — R479 Unspecified speech disturbances: Secondary | ICD-10-CM | POA: Diagnosis present

## 2018-05-05 DIAGNOSIS — R402242 Coma scale, best verbal response, confused conversation, at arrival to emergency department: Secondary | ICD-10-CM | POA: Diagnosis present

## 2018-05-05 DIAGNOSIS — E162 Hypoglycemia, unspecified: Secondary | ICD-10-CM

## 2018-05-05 DIAGNOSIS — R159 Full incontinence of feces: Secondary | ICD-10-CM | POA: Diagnosis not present

## 2018-05-05 DIAGNOSIS — I639 Cerebral infarction, unspecified: Secondary | ICD-10-CM | POA: Diagnosis not present

## 2018-05-05 DIAGNOSIS — Z515 Encounter for palliative care: Secondary | ICD-10-CM | POA: Diagnosis not present

## 2018-05-05 DIAGNOSIS — F419 Anxiety disorder, unspecified: Secondary | ICD-10-CM | POA: Diagnosis present

## 2018-05-05 DIAGNOSIS — I110 Hypertensive heart disease with heart failure: Secondary | ICD-10-CM | POA: Diagnosis present

## 2018-05-05 DIAGNOSIS — Z8249 Family history of ischemic heart disease and other diseases of the circulatory system: Secondary | ICD-10-CM

## 2018-05-05 DIAGNOSIS — M2550 Pain in unspecified joint: Secondary | ICD-10-CM | POA: Diagnosis not present

## 2018-05-05 DIAGNOSIS — Z86711 Personal history of pulmonary embolism: Secondary | ICD-10-CM | POA: Diagnosis present

## 2018-05-05 DIAGNOSIS — I629 Nontraumatic intracranial hemorrhage, unspecified: Secondary | ICD-10-CM | POA: Diagnosis not present

## 2018-05-05 DIAGNOSIS — R2689 Other abnormalities of gait and mobility: Secondary | ICD-10-CM | POA: Diagnosis not present

## 2018-05-05 DIAGNOSIS — R279 Unspecified lack of coordination: Secondary | ICD-10-CM | POA: Diagnosis not present

## 2018-05-05 DIAGNOSIS — R32 Unspecified urinary incontinence: Secondary | ICD-10-CM | POA: Diagnosis not present

## 2018-05-05 DIAGNOSIS — R5381 Other malaise: Secondary | ICD-10-CM | POA: Diagnosis not present

## 2018-05-05 LAB — COMPREHENSIVE METABOLIC PANEL
ALK PHOS: 40 U/L (ref 38–126)
ALT: 9 U/L (ref 0–44)
ANION GAP: 8 (ref 5–15)
AST: 60 U/L — ABNORMAL HIGH (ref 15–41)
Albumin: 3.6 g/dL (ref 3.5–5.0)
BUN: 10 mg/dL (ref 8–23)
CALCIUM: 9.3 mg/dL (ref 8.9–10.3)
CO2: 27 mmol/L (ref 22–32)
Chloride: 103 mmol/L (ref 98–111)
Creatinine, Ser: 0.94 mg/dL (ref 0.61–1.24)
Glucose, Bld: 107 mg/dL — ABNORMAL HIGH (ref 70–99)
Potassium: 6.5 mmol/L (ref 3.5–5.1)
Sodium: 138 mmol/L (ref 135–145)
Total Bilirubin: 1.4 mg/dL — ABNORMAL HIGH (ref 0.3–1.2)
Total Protein: 6.8 g/dL (ref 6.5–8.1)

## 2018-05-05 LAB — I-STAT ARTERIAL BLOOD GAS, ED
ACID-BASE DEFICIT: 2 mmol/L (ref 0.0–2.0)
BICARBONATE: 22.3 mmol/L (ref 20.0–28.0)
O2 Saturation: 94 %
PO2 ART: 70 mmHg — AB (ref 83.0–108.0)
TCO2: 23 mmol/L (ref 22–32)
pCO2 arterial: 37.4 mmHg (ref 32.0–48.0)
pH, Arterial: 7.384 (ref 7.350–7.450)

## 2018-05-05 LAB — BASIC METABOLIC PANEL
Anion gap: 8 (ref 5–15)
BUN: 10 mg/dL (ref 8–23)
CALCIUM: 9.4 mg/dL (ref 8.9–10.3)
CO2: 23 mmol/L (ref 22–32)
Chloride: 108 mmol/L (ref 98–111)
Creatinine, Ser: 0.82 mg/dL (ref 0.61–1.24)
GFR calc Af Amer: >60 mL/min (ref >60)
GLUCOSE: 72 mg/dL (ref 70–99)
Potassium: 4.3 mmol/L (ref 3.5–5.1)
Sodium: 139 mmol/L (ref 135–145)

## 2018-05-05 LAB — MAGNESIUM: Magnesium: 1.7 mg/dL (ref 1.7–2.4)

## 2018-05-05 LAB — CBC WITH DIFFERENTIAL/PLATELET
Abs Immature Granulocytes: 0 10*3/uL (ref 0.0–0.1)
BASOS ABS: 0 10*3/uL (ref 0.0–0.1)
Basophils Relative: 1 %
EOS PCT: 1 %
Eosinophils Absolute: 0.1 10*3/uL (ref 0.0–0.7)
HCT: 42.9 % (ref 39.0–52.0)
Hemoglobin: 13.3 g/dL (ref 13.0–17.0)
IMMATURE GRANULOCYTES: 0 %
Lymphocytes Relative: 19 %
Lymphs Abs: 0.9 10*3/uL (ref 0.7–4.0)
MCH: 31.1 pg (ref 26.0–34.0)
MCHC: 31 g/dL (ref 30.0–36.0)
MCV: 100.2 fL — ABNORMAL HIGH (ref 78.0–100.0)
Monocytes Absolute: 0.2 10*3/uL (ref 0.1–1.0)
Monocytes Relative: 5 %
NEUTROS PCT: 74 %
Neutro Abs: 3.5 10*3/uL (ref 1.7–7.7)
PLATELETS: 196 10*3/uL (ref 150–400)
RBC: 4.28 MIL/uL (ref 4.22–5.81)
RDW: 14.6 % (ref 11.5–15.5)
WBC: 4.8 10*3/uL (ref 4.0–10.5)

## 2018-05-05 LAB — LIPID PANEL
CHOL/HDL RATIO: 3.8 ratio
Cholesterol: 242 mg/dL — ABNORMAL HIGH (ref 0–200)
HDL: 63 mg/dL (ref >40)
LDL CALC: 148 mg/dL — AB (ref 0–99)
Triglycerides: 157 mg/dL — ABNORMAL HIGH (ref <150)
VLDL: 31 mg/dL (ref 0–40)

## 2018-05-05 LAB — URINALYSIS, ROUTINE W REFLEX MICROSCOPIC
Bacteria, UA: NONE SEEN
Bilirubin Urine: NEGATIVE
Glucose, UA: NEGATIVE mg/dL
Hgb urine dipstick: NEGATIVE
KETONES UR: NEGATIVE mg/dL
Nitrite: NEGATIVE
PH: 7 (ref 5.0–8.0)
Protein, ur: NEGATIVE mg/dL
SPECIFIC GRAVITY, URINE: 1.008 (ref 1.005–1.030)

## 2018-05-05 LAB — HEMOGLOBIN A1C
HEMOGLOBIN A1C: 5.9 % — AB (ref 4.8–5.6)
MEAN PLASMA GLUCOSE: 122.63 mg/dL

## 2018-05-05 LAB — LIPASE, BLOOD: LIPASE: 26 U/L (ref 11–51)

## 2018-05-05 LAB — I-STAT CG4 LACTIC ACID, ED: LACTIC ACID, VENOUS: 1.24 mmol/L (ref 0.5–1.9)

## 2018-05-05 LAB — PROTIME-INR
INR: 2.22
INR: 1.23
PROTHROMBIN TIME: 15.4 s — AB (ref 11.4–15.2)
Prothrombin Time: 24.4 seconds — ABNORMAL HIGH (ref 11.4–15.2)

## 2018-05-05 LAB — CBG MONITORING, ED: Glucose-Capillary: 73 mg/dL (ref 70–99)

## 2018-05-05 LAB — I-STAT TROPONIN, ED: TROPONIN I, POC: 0.02 ng/mL (ref 0.00–0.08)

## 2018-05-05 LAB — GLUCOSE, CAPILLARY: Glucose-Capillary: 88 mg/dL (ref 70–99)

## 2018-05-05 MED ORDER — INSULIN ASPART 100 UNIT/ML IV SOLN: 10.0000 [IU] | Freq: Once | INTRAVENOUS | Status: DC

## 2018-05-05 MED ORDER — SODIUM CHLORIDE 0.9 % IV BOLUS
500.0000 mL | Freq: Once | INTRAVENOUS | Status: AC
  Administered 2018-05-05: 500 mL via INTRAVENOUS

## 2018-05-05 MED ORDER — NICARDIPINE HCL IN NACL 20-0.86 MG/200ML-% IV SOLN
0.0000 mg/h | INTRAVENOUS | Status: DC
  Administered 2018-05-05 – 2018-05-06 (×2): 5 mg/h via INTRAVENOUS
  Filled 2018-05-05 (×2): qty 200

## 2018-05-05 MED ORDER — ACETAMINOPHEN 325 MG PO TABS
650.0000 mg | ORAL_TABLET | ORAL | Status: DC | PRN
  Administered 2018-05-11: 650 mg via ORAL
  Filled 2018-05-05: qty 2

## 2018-05-05 MED ORDER — CARBIDOPA-LEVODOPA 25-100 MG PO TABS
1.0000 | ORAL_TABLET | Freq: Four times a day (QID) | ORAL | Status: DC
  Administered 2018-05-05 – 2018-05-12 (×26): 1 via ORAL
  Filled 2018-05-05 (×27): qty 1

## 2018-05-05 MED ORDER — LABETALOL HCL 5 MG/ML IV SOLN
20.0000 mg | Freq: Once | INTRAVENOUS | Status: AC
  Administered 2018-05-05: 20 mg via INTRAVENOUS
  Filled 2018-05-05: qty 4

## 2018-05-05 MED ORDER — PROTHROMBIN COMPLEX CONC HUMAN 500 UNITS IV KIT
1592.0000 [IU] | PACK | Freq: Once | INTRAVENOUS | Status: AC
  Administered 2018-05-05: 1592 [IU] via INTRAVENOUS
  Filled 2018-05-05: qty 500

## 2018-05-05 MED ORDER — NICARDIPINE HCL IN NACL 20-0.86 MG/200ML-% IV SOLN
0.0000 mg/h | INTRAVENOUS | Status: DC
  Filled 2018-05-05: qty 200

## 2018-05-05 MED ORDER — INSULIN ASPART 100 UNIT/ML ~~LOC~~ SOLN
10.0000 [IU] | Freq: Once | SUBCUTANEOUS | Status: AC
  Administered 2018-05-05: 10 [IU] via INTRAVENOUS
  Filled 2018-05-05: qty 1

## 2018-05-05 MED ORDER — SODIUM CHLORIDE 0.9 % IV SOLN
1.0000 g | INTRAVENOUS | Status: AC
  Administered 2018-05-05: 1 g via INTRAVENOUS
  Filled 2018-05-05: qty 10

## 2018-05-05 MED ORDER — ACETAMINOPHEN 160 MG/5ML PO SOLN: 650.0000 mg | ORAL | Status: DC | PRN

## 2018-05-05 MED ORDER — FAMOTIDINE IN NACL 20-0.9 MG/50ML-% IV SOLN
20.0000 mg | Freq: Two times a day (BID) | INTRAVENOUS | Status: DC
  Administered 2018-05-06 – 2018-05-12 (×13): 20 mg via INTRAVENOUS
  Filled 2018-05-05 (×14): qty 50

## 2018-05-05 MED ORDER — VITAMIN K1 10 MG/ML IJ SOLN
10.0000 mg | INTRAMUSCULAR | Status: AC
  Administered 2018-05-05: 10 mg via INTRAVENOUS
  Filled 2018-05-05: qty 1

## 2018-05-05 MED ORDER — ACETAMINOPHEN 650 MG RE SUPP
650.0000 mg | RECTAL | Status: DC | PRN
Start: 2018-05-05 — End: 2018-05-12

## 2018-05-05 MED ORDER — SENNOSIDES-DOCUSATE SODIUM 8.6-50 MG PO TABS
1.0000 | ORAL_TABLET | Freq: Two times a day (BID) | ORAL | Status: DC
  Administered 2018-05-06 – 2018-05-12 (×9): 1 via ORAL
  Filled 2018-05-05 (×12): qty 1

## 2018-05-05 MED ORDER — STROKE: EARLY STAGES OF RECOVERY BOOK
Freq: Once | Status: DC
  Filled 2018-05-05: qty 1

## 2018-05-05 MED ORDER — DEXTROSE 50 % IV SOLN
1.0000 | Freq: Once | INTRAVENOUS | Status: AC
  Administered 2018-05-05: 50 mL via INTRAVENOUS
  Filled 2018-05-05: qty 50

## 2018-05-05 NOTE — ED Notes (Signed)
Reported elevated bp to Dr. Laverta Baltimore. He is going to consult neuro and make decisions.

## 2018-05-05 NOTE — ED Provider Notes (Signed)
Emergency Department Provider Note   I have reviewed the triage vital signs and the nursing notes.   HISTORY  Chief Complaint Bradycardia   HPI [name] [name] is a 85 y.o. male with PMH of CAD, GERD, HTN, HLD, prior PR on Warfarin, and advanced parkinson's disease to the emergency department with acute onset severe diaphoresis with decreased alertness.  Intake occurred this morning.  Family state that the patient is very slow to respond at baseline but was not complaining of pain.  They did not notice any fever.  He states that he had profuse, soaking sweats.  He has had similar sweating with the bradycardia events and hypotension events in the past.  The family member at bedside tells me that the patient has a loop recorder but was deemed to not be a good candidate for pacemaker.  They follow with Dr. [name] with [facility].   Level 5 caveat: Severe Parkinson's with speech disturbance.    Past Medical History:  Diagnosis Date  . Anxiety   . Coronary artery disease, non-occlusive    with history of MI; Cath 2008 w multivessel nonobstructive CAD  . Dementia   . Depression   . GERD (gastroesophageal reflux disease)   . Hyperlipidemia   . Hypertension   . Memory loss   . Parkinson disease ([place])   . PE (pulmonary embolism)    unprovoked PE completed 6 months of warfarin, warfarin d/ced 02/12/2010, repeat PE 07/10/2010 after a Lanesha Azzaro car ride and now on lifelong coumadin.  . Pituitary microadenoma ([place])    incidental finding CT 12/2009  . Prostate cancer ([place])   . Scoliosis   . Sigmoid volvulus ([place]) 08/02/2016  . Volvulus of colon ([place]) 06/01/2017    Patient Active Problem List   Diagnosis Date Noted  . ICH (intracerebral hemorrhage) ([place]) 05/05/2018  . Hyperkalemia 05/05/2018  . Chalazion left upper eyelid 04/11/2018  . Lethargy 11/10/2017  . Fatigue 09/08/2017  . Need for immunization against influenza 09/08/2017  . Recurrent UTI 06/27/2017  . Pressure sore on buttocks  06/27/2017  . Generalized abdominal pain   . Volvulus of sigmoid colon ([place]) 05/31/2017  . Aortic atherosclerosis ([place]) 05/31/2017  . Loss of weight 09/23/2016  . Abdominal distension   . Ezell Poke term current use of anticoagulant   . Right bundle branch block   . Hypokalemia 08/02/2016  . Diverticulosis 07/15/2016  . Statin myopathy 03/15/2016  . Parkinsonian syndrome ([place]) 08/11/2015  . Normocytic anemia, not due to blood loss 04/16/2015  . Chronic systolic heart failure ([place]) 04/16/2015  . Falls frequently 04/16/2015  . Onychomycosis 04/15/2015  . CAD (coronary artery disease) 01/16/2015  . 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 Healthcare maintenance 10/10/2014  . Lumbosacral spondylosis without myelopathy 09/24/2013  . Hypernatremia 02/20/2013  . Bradycardia 02/20/2013  . Generalized ischemic cerebrovascular disease 02/05/2013  . Depression 01/25/2013  . Insomnia 01/20/2012  . Alzheimer's dementia 08/17/2011  . History of pulmonary embolism 09/18/2010  . Encounter for Rechel Delosreyes-term use of antiplatelets/antithrombotics 09/18/2010  . DJD (degenerative joint disease) 08/06/2010  . ADENOCARCINOMA, PROSTATE, HX OF 08/06/2010  . HLD (hyperlipidemia) 10/15/2009  . Essential hypertension, benign 10/15/2009  . GERD 10/15/2009    Past Surgical History:  Procedure Laterality Date  . CATARACT EXTRACTION    . COLONOSCOPY N/A 05/30/2017   Procedure: COLONOSCOPY;  Surgeon: Ronald Lobo, MD;  Location[facility] ENDOSCOPY;  Service: Endoscopy;  Laterality: N/A;  . FLEXIBLE SIGMOIDOSCOPY N/A 08/02/2016   Procedure: FLEXIBLE SIGMOIDOSCOPY;  Surgeon: Ronald Lobo, MD;  Location: [facility] ENDOSCOPY;  Service: Endoscopy;  Laterality: N/A;  . FLEXIBLE SIGMOIDOSCOPY N/A 08/06/2016   Procedure: FLEXIBLE SIGMOIDOSCOPY;  Surgeon: Ronald Lobo, MD;  Location: [facility] ENDOSCOPY;  Service: Endoscopy;  Laterality: N/A;  . FLEXIBLE SIGMOIDOSCOPY Left 06/02/2017   Procedure: FLEXIBLE SIGMOIDOSCOPY;  Surgeon: Arta Silence,  MD;  Location: [facility] ENDOSCOPY;  Service: Endoscopy;  Laterality: Left;  . FLEXIBLE SIGMOIDOSCOPY N/A 06/07/2017   Procedure: FLEXIBLE SIGMOIDOSCOPY for possible decompression;  Surgeon: Otis Brace, MD;  Location: [facility];  Service: Gastroenterology;  Laterality: N/A;  . LEFT HEART CATHETERIZATION WITH CORONARY ANGIOGRAM N/A 12/10/2014   Procedure: LEFT HEART CATHETERIZATION WITH CORONARY ANGIOGRAM;  Surgeon: [name], MD;  Location[facility] CATH LAB;  Service: Cardiovascular;  Laterality: N/A;  . LOOP RECORDER IMPLANT N/A 12/16/2014   Procedure: LOOP RECORDER IMPLANT;  Surgeon: [name], MD;  Location: [facility] CATH LAB;  Service: Cardiovascular;  Laterality: N/A;  . PARTIAL COLECTOMY N/A 06/08/2017   Procedure: SIGMOID  COLECTOMY;  Surgeon: Georganna Skeans, MD;  Location: [address];  Service: General;  Laterality: N/A;  . PROSTATECTOMY      Allergies Aricept Reather Littler hcl] and Shrimp [shellfish allergy]  Family History  Problem Relation Age of Onset  . Hypertension Father        Passed away from cerebral hemorrhage at age of 20.  [name] Dementia Mother   . Hypertension Child        4 adult children  . Cervical cancer Other   . Lung cancer Other   . Stroke Other   . Heart attack Neg Hx     Social History Social History   Tobacco Use  . Smoking status: Never Smoker  . Smokeless tobacco: Never Used  Substance Use Topics  . Alcohol use: No    Alcohol/week: 0.0 standard drinks  . Drug use: No    Review of Systems  Level 5 caveat: Patient is non-verbal.  ____________________________________________   PHYSICAL EXAM:  VITAL SIGNS: ED Triage Vitals  Enc Vitals Group     BP 05/05/18 1459 (!) 151/122     Pulse Rate 05/05/18 1459 61     Resp 05/05/18 1459 16     SpO2 05/05/18 1459 100 %     Weight 05/05/18 1458 136 lb 0.4 oz (61.7 kg)     Height 05/05/18 1458 5\' 8"  (1.727 m)    Constitutional: Patient poorly responsive to verbal command (baseline). No acute  distress. Eyes: Conjunctivae are normal.  Head: Atraumatic. Nose: No congestion/rhinnorhea. Mouth/Throat: Mucous membranes are slightly dry.  Neck: No stridor.   Cardiovascular: Normal rate, regular rhythm. Good peripheral circulation. Grossly normal heart sounds.   Respiratory: Normal respiratory effort.  No retractions. Lungs CTAB. Gastrointestinal: Soft and nontender. No distention.  Musculoskeletal: Flexed extremities with increased tone throughout.  Neurologic: Increased tone throughout with resting tremor.  Skin:  Skin is warm, dry and intact. No rash noted.  ____________________________________________   LABS (all labs ordered are listed, but only abnormal results are displayed)  Labs Reviewed  COMPREHENSIVE METABOLIC PANEL - Abnormal; Notable for the following components:      Result Value   Potassium 6.5 (*)    Glucose, Bld 107 (*)    AST 60 (*)    Total Bilirubin 1.4 (*)    All other components within normal limits  CBC WITH DIFFERENTIAL/PLATELET - Abnormal; Notable for the following components:   MCV 100.2 (*)    All other components within normal limits  URINALYSIS, ROUTINE W REFLEX MICROSCOPIC -  Abnormal; Notable for the following components:   Color, Urine STRAW (*)    Leukocytes, UA TRACE (*)    All other components within normal limits  PROTIME-INR - Abnormal; Notable for the following components:   Prothrombin Time 24.4 (*)    All other components within normal limits  LIPID PANEL - Abnormal; Notable for the following components:   Cholesterol 242 (*)    Triglycerides 157 (*)    LDL Cholesterol 148 (*)    All other components within normal limits  HEMOGLOBIN A1C - Abnormal; Notable for the following components:   Hgb A1c MFr Bld 5.9 (*)    All other components within normal limits  PROTIME-INR - Abnormal; Notable for the following components:   Prothrombin Time 15.4 (*)    All other components within normal limits  I-STAT ARTERIAL BLOOD GAS, ED -  Abnormal; Notable for the following components:   pO2, Arterial 70.0 (*)    All other components within normal limits  URINE CULTURE  MRSA PCR SCREENING  LIPASE, BLOOD  BASIC METABOLIC PANEL  MAGNESIUM  BASIC METABOLIC PANEL  CBC  MAGNESIUM  PHOSPHORUS  BLOOD GAS, ARTERIAL  PROTIME-INR  I-STAT CG4 LACTIC ACID, ED  I-STAT TROPONIN, ED  CBG MONITORING, ED  CBG MONITORING, ED  CBG MONITORING, ED  CBG MONITORING, ED   ____________________________________________  EKG   EKG Interpretation  Date/Time:  Friday May 05 2018 15:46:49 EDT Ventricular Rate:  62 PR Interval:  200 QRS Duration: 122 QT Interval:  440 QTC Calculation: 446 R Axis:   64 Text Interpretation:  Normal sinus rhythm Right bundle branch block Septal infarct , age undetermined Abnormal ECG No STEMI.  Confirmed by Nanda Quinton [phone]) on 05/05/2018 4:02:20 PM       ____________________________________________  RADIOLOGY  Dg Chest 2 View  Result Date: 05/05/2018 CLINICAL DATA:  Shortness of Breath EXAM: CHEST - 2 VIEW COMPARISON:  November 12, 2017 FINDINGS: There is elevation of the left hemidiaphragm, stable. There is mild bibasilar atelectasis. There is no edema or consolidation. Heart size and pulmonary vascularity are normal. No adenopathy. There is a loop recorder on the left inferiorly and anteriorly. No evident bone lesions. IMPRESSION: Stable elevation of left hemidiaphragm. Mild bibasilar atelectasis. No edema or consolidation. Stable cardiac silhouette. Electronically Signed   By: Lowella Grip [name] M.D.   On: 05/05/2018 15:35   Ct Head Wo Contrast  Result Date: 05/05/2018 CLINICAL DATA:  Altered mental status EXAM: CT HEAD WITHOUT CONTRAST TECHNIQUE: Contiguous axial images were obtained from the base of the skull through the vertex without intravenous contrast. COMPARISON:  Head CT 11/12/2017 FINDINGS: Brain: There is a 18 x 10 mm focus of acute hemorrhage in the right posterior fossa, likely  intraparenchymal. Mild atrophy for age. There is no acute or chronic infarction. There is hypoattenuation of the periventricular white matter, most commonly indicating chronic ischemic microangiopathy. Vascular: Atherosclerotic calcification of the internal carotid arteries at the skull base. No abnormal hyperdensity of the major intracranial arteries or dural venous sinuses. Skull: The visualized skull base, calvarium and extracranial soft tissues are normal. Sinuses/Orbits: No fluid levels or advanced mucosal thickening of the visualized paranasal sinuses. No mastoid or middle ear effusion. The orbits are normal. IMPRESSION: 1. Small focus of hemorrhage in the right posterior fossa, likely intraparenchymal within the cerebellum. No associated mass effect. 2. Severe chronic ischemic microangiopathy. Critical Value/emergent results were called by telephone at the time of interpretation on 05/05/2018 at 4:46 pm to Dr. Vonna Kotyk  Venisha Boehning , who verbally acknowledged these results. Electronically Signed   By: Ulyses Jarred M.D.   On: 05/05/2018 16:54    ____________________________________________   PROCEDURES  Procedure(s) performed:   .Critical Care Performed by: Margette Fast, MD Authorized by: Margette Fast, MD   Critical care provider statement:    Critical care time (minutes):  35   Critical care time was exclusive of:  Separately billable procedures and treating other patients and teaching time   Critical care was necessary to treat or prevent imminent or life-threatening deterioration of the following conditions:  CNS failure or compromise   Critical care was time spent personally by me on the following activities:  Blood draw for specimens, development of treatment plan with patient or surrogate, discussions with consultants, evaluation of patient's response to treatment, examination of patient, obtaining history from patient or surrogate, ordering and performing treatments and interventions,  ordering and review of laboratory studies, ordering and review of radiographic studies, pulse oximetry, re-evaluation of patient's condition and review of old charts   I assumed direction of critical care for this patient from another provider in my specialty: no       ____________________________________________   INITIAL IMPRESSION / [place] / ED COURSE  Pertinent labs & imaging results that were available during my care of the patient were reviewed by me and considered in my medical decision making (see chart for details).  Patient presents to the emergency department with episode of severe sweating and decreased alertness.  He has known arrhythmia history and has a Medtronic loop recorder implanted.  Per my discussions with the family it sounds as if cardiology does not think a pacemaker would help in this situation and if palliative measures should be undertaken.  Patient is afebrile here.  He appears to be at his mental status baseline but history and physical are limited due to the patient's underlying parkinsonian features.  Plan to interrogate the loop recorder if possible and reassess after labs, UA, CXR, and CT head.   CT head with cerebellar hemorrhage. Remote trauma history noted but doubt this is traumatic. Spoke with NSG and Neurology. Dr. Malen Gauze to admit. Will reverse anticoagulation and control BP with Nicardipine. Patient also with potassium of 6.5. Will start Calcium, Insulin, and D50.   Discussed patient's case with Neurology, Dr. Malen Gauze to request admission. Patient and family (if present) updated with plan. Care transferred to Neurology service.  I reviewed all nursing notes, vitals, pertinent old records, EKGs, labs, imaging (as available).  ____________________________________________  FINAL CLINICAL IMPRESSION(S) / ED DIAGNOSES  Final diagnoses:  Hemorrhagic stroke ([place])  Hyperkalemia     MEDICATIONS GIVEN DURING THIS VISIT:  Medications    stroke: mapping our early stages of recovery book (has no administration in time range)  acetaminophen (TYLENOL) tablet 650 mg (has no administration in time range)    Or  acetaminophen (TYLENOL) solution 650 mg (has no administration in time range)    Or  acetaminophen (TYLENOL) suppository 650 mg (has no administration in time range)  senna-docusate (Senokot-S) tablet 1 tablet (has no administration in time range)  famotidine (PEPCID) IVPB 20 mg premix (has no administration in time range)  labetalol (NORMODYNE,TRANDATE) injection 20 mg (20 mg Intravenous Given 05/05/18 1845)    And  nicardipine (CARDENE) 20mg  in 0.86% saline 267ml IV infusion (0.1 mg/ml) (0 mg/hr Intravenous Transfusing/Transfer 05/05/18 2157)  carbidopa-levodopa (SINEMET IR) 25-100 MG per tablet immediate release 1 tablet (1 tablet Oral Given 05/05/18 2027)  sodium chloride 0.9 % bolus 500 mL (0 mLs Intravenous Stopped 05/05/18 1717)  prothrombin complex conc human (KCENTRA) IVPB 1,592 Units (0 Units Intravenous Stopped 05/05/18 1910)  phytonadione (VITAMIN K) 10 mg in dextrose 5 % 50 mL IVPB (0 mg Intravenous Stopped 05/05/18 1844)  dextrose 50 % solution 50 mL (50 mLs Intravenous Given 05/05/18 1833)  calcium gluconate 1 g in sodium chloride 0.9 % 100 mL IVPB (0 g Intravenous Stopped 05/05/18 1934)  insulin aspart (novoLOG) injection 10 Units (10 Units Intravenous Given 05/05/18 1838)    Note:  This document was prepared using Dragon voice recognition software and may include unintentional dictation errors.  Nanda Quinton, MD Emergency Medicine    Ruthellen Tippy, [facility], MD 05/05/18 [phone]

## 2018-05-05 NOTE — ED Notes (Signed)
Called ED pharmacy for consult with medications.

## 2018-05-05 NOTE — ED Notes (Signed)
Sent text page to Dr Aroor to request medication for MRI

## 2018-05-05 NOTE — Progress Notes (Signed)
   05/05/18 1932  Clinical Encounter Type  Visited With Patient and family together;Health care provider  Visit Type ED  Referral From Nurse  Consult/Referral To Chaplain  Stress Factors  Family Stress Factors Health changes   Responded to a page from this patient's nurse.  She indicated the daughter was upset about her father.  Her mother passed away in Mar 01, 2023 and so this has brought up a lot of emotions.  Daughter stated she is hanging in there and I encouraged her to talked to her dad.  Other family present for support.  Will follow up later this evening after patient is in a room. Chaplain [name]

## 2018-05-05 NOTE — ED Notes (Signed)
Dr. Jeong at bedside.  

## 2018-05-05 NOTE — ED Notes (Signed)
Spoke with neuro. Dr. Carloyn Jaeger and his college are cancelling the MRI for now.

## 2018-05-05 NOTE — ED Notes (Signed)
Neurology at bedside.

## 2018-05-05 NOTE — ED Notes (Signed)
Paged neuro again to request meds for MRI

## 2018-05-05 NOTE — ED Notes (Signed)
Patient transported to X-ray 

## 2018-05-05 NOTE — ED Notes (Signed)
Interrmediate size condom catheter in place. Secured with bed bag and leg strap

## 2018-05-05 NOTE — ED Notes (Signed)
Attempted to interrogate loop cardiac reader with medtronic interrogator. Interogation  Would not connect to pt loop reader. Pt daughter said the pts battery is probably dead, they chose not to have a new battery inserted due to pt not having any cardiac problems while the monitor has been working.

## 2018-05-05 NOTE — Consult Note (Signed)
Initial Pulmonary/Critical Care Consultation  Patient Name: [name] [name] MRN: [id number] DOB: [birth year]    ADMISSION DATE:  05/05/2018 CONSULTATION DATE:  05/05/2018  REFERRING MD:  Dr. Amie Portland  REASON FOR CONSULTATION:  hyperkalemia   HISTORY OF PRESENT ILLNESS  This 84 y.o. African-American male non-smoker is seen in consultation at the request of Dr. Amie Portland for recommendations on further evaluation and management of hyperkalemia.  The patient lives at home with 1 of his daughters, who serves as primary caregiver.  He was noted to be clammy and diaphoretic earlier today apparently had a syncopal episode, prompting presentation to the emergency department.  He has advanced Parkinson's disease with dementia at baseline.  He is verbal but difficult to understand.  He does have gait instability and requires assistance for ambulation.  Initial evaluation in the emergency department revealed that he has a small posterior cerebellar hemorrhage.  No mass-effect.  Laboratory evaluation revealed hyperkalemia and (K 6.5).  INR 2.2.  The patient is on warfarin for history of pulmonary embolism.  In the emergency department, Eppie Gibson has already been administered.  This patient is critically ill and cannot provide additional history due to advanced dementia.   PAST MEDICAL/SURGICAL/SOCIAL/FAMILY HISTORIES   Past Medical History:  Diagnosis Date  . Anxiety   . Coronary artery disease, non-occlusive    with history of MI; Cath 2008 w multivessel nonobstructive CAD  . Dementia   . Depression   . GERD (gastroesophageal reflux disease)   . Hyperlipidemia   . Hypertension   . Memory loss   . Parkinson disease ([place])   . PE (pulmonary embolism)    unprovoked PE completed 6 months of warfarin, warfarin d/ced 02/12/2010, repeat PE 07/10/2010 after a long car ride and now on lifelong coumadin.  . Pituitary microadenoma ([place])    incidental finding CT 12/2009  . Prostate cancer ([place])   .  Scoliosis   . Sigmoid volvulus ([place]) 08/02/2016  . Volvulus of colon ([place]) 06/01/2017    Past Surgical History:  Procedure Laterality Date  . CATARACT EXTRACTION    . COLONOSCOPY N/A 05/30/2017   Procedure: COLONOSCOPY;  Surgeon: Ronald Lobo, MD;  Location: [facility] ENDOSCOPY;  Service: Endoscopy;  Laterality: N/A;  . FLEXIBLE SIGMOIDOSCOPY N/A 08/02/2016   Procedure: FLEXIBLE SIGMOIDOSCOPY;  Surgeon: Ronald Lobo, MD;  Location: [facility] ENDOSCOPY;  Service: Endoscopy;  Laterality: N/A;  . FLEXIBLE SIGMOIDOSCOPY N/A 08/06/2016   Procedure: FLEXIBLE SIGMOIDOSCOPY;  Surgeon: Ronald Lobo, MD;  Location: [facility] ENDOSCOPY;  Service: Endoscopy;  Laterality: N/A;  . FLEXIBLE SIGMOIDOSCOPY Left 06/02/2017   Procedure: FLEXIBLE SIGMOIDOSCOPY;  Surgeon: Arta Silence, MD;  Location: [facility] ENDOSCOPY;  Service: Endoscopy;  Laterality: Left;  . FLEXIBLE SIGMOIDOSCOPY N/A 06/07/2017   Procedure: FLEXIBLE SIGMOIDOSCOPY for possible decompression;  Surgeon: Otis Brace, MD;  Location: [place];  Service: Gastroenterology;  Laterality: N/A;  . LEFT HEART CATHETERIZATION WITH CORONARY ANGIOGRAM N/A 12/10/2014   Procedure: LEFT HEART CATHETERIZATION WITH CORONARY ANGIOGRAM;  Surgeon: [name], MD;  Location: [facility] CATH LAB;  Service: Cardiovascular;  Laterality: N/A;  . LOOP RECORDER IMPLANT N/A 12/16/2014   Procedure: LOOP RECORDER IMPLANT;  Surgeon: [name], MD;  Location: [name] CATH LAB;  Service: Cardiovascular;  Laterality: N/A;  . PARTIAL COLECTOMY N/A 06/08/2017   Procedure: SIGMOID  COLECTOMY;  Surgeon: Georganna Skeans, MD;  Location: [address];  Service: General;  Laterality: N/A;  . PROSTATECTOMY      Social History   Tobacco Use  . Smoking status: Never Smoker  .  Smokeless tobacco: Never Used  Substance Use Topics  . Alcohol use: No    Alcohol/week: 0.0 standard drinks    Family History  Problem Relation Age of Onset  . Hypertension Father        Passed away from cerebral hemorrhage  at age of 22.  [name] Dementia Mother   . Hypertension Child        4 adult children  . Cervical cancer Other   . Lung cancer Other   . Stroke Other   . Heart attack Neg Hx      Allergies  Allergen Reactions  . Aricept [Donepezil Hcl] Other (See Comments)    Symptomatic Bradycardia.  . Shrimp [Shellfish Allergy] Other (See Comments)    Causes gout     Prior to Admission medications   Medication Sig Start Date End Date Taking? Authorizing Provider  acetaminophen (TYLENOL) 500 MG tablet Take 1,000 mg by mouth 2 (two) times daily.    Yes [provider]  amLODipine (NORVASC) 2.5 MG tablet Take 1 tablet (2.5 mg total) by mouth daily. Patient taking differently: Take 2.5 mg by mouth daily as needed (BP >120/100).  11/17/17  Yes Ledell Noss, MD  aspirin 81 MG chewable tablet Chew 81 mg by mouth daily as needed (chest pain).   Yes [provider]  b complex vitamins tablet Take 1 tablet by mouth daily.    Yes [provider]  Ca Phosphate-Cholecalciferol (CALCIUM/VITAMIN D3 GUMMIES) 250-350 MG-UNIT CHEW Chew 1 Dose by mouth 2 (two) times daily. Patient taking differently: Chew 1 tablet by mouth 2 (two) times daily.  07/08/16  Yes [name], DO  carbidopa-levodopa (SINEMET IR) 25-100 MG tablet Take 1 tablet by mouth 4 (four) times daily. 05/02/18  Yes Garvin Fila, MD  fish oil-omega-3 fatty acids 1000 MG capsule Take 2 g by mouth daily.    Yes [provider]  loratadine (CLARITIN) 10 MG tablet Take 10 mg by mouth daily.   Yes [provider]  memantine (NAMENDA XR) 28 MG CP24 24 hr capsule TAKE 1 CAPSULE BY MOUTH EVERY DAY IN THE MORNING Patient taking differently: Take 28 mg by mouth daily.  05/02/18  Yes Garvin Fila, MD  Multiple Vitamins-Minerals (MULTIVITAMIN WITH MINERALS) tablet Take 1 tablet by mouth daily.    Yes [provider]  polyethylene glycol (MIRALAX / GLYCOLAX) packet Take 17 g by mouth daily as needed  (constipation).   Yes [provider]  potassium chloride SA (KLOR-CON M20) 20 MEQ tablet Take 2 tablets (40 mEq total) by mouth daily. 09/29/17  Yes [name], DO  QUEtiapine (SEROQUEL) 25 MG tablet TAKE 1 TABLET AT BEDTIME MAY USE 1/2 TABLET DURING THE DAY AS NEEDED FOR AGITATION/HALLUCINATIONS Patient taking differently: Take 12.5 mg by mouth at bedtime.  04/13/18  Yes Garvin Fila, MD  senna (SENOKOT) 8.6 MG TABS tablet Take 1 tablet (8.6 mg total) by mouth daily as needed for mild constipation. 07/13/16  Yes Velna Ochs, MD  vitamin C (ASCORBIC ACID) 500 MG tablet Take 1,000 mg by mouth daily.    Yes [provider]  warfarin (COUMADIN) 5 MG tablet Take 2.5-5 mg by mouth See admin instructions. Take one tablet (5 mg) by mouth on Monday, Wednesday, Friday at 6 pm, take 1/2 tablet (2.5 mg) on Sunday, Tuesday, Thursday, Saturday at 6pm   Yes [provider]    Current Facility-Administered Medications  Medication Dose Route Frequency Provider Last Rate Last Dose  .  stroke: mapping our early stages of recovery book   Does not apply Once Amie Portland, MD      . acetaminophen (TYLENOL) tablet 650 mg  650 mg Oral Q4H PRN Amie Portland, MD       Or  . acetaminophen (TYLENOL) solution 650 mg  650 mg Per Tube Q4H PRN Amie Portland, MD       Or  . acetaminophen (TYLENOL) suppository 650 mg  650 mg Rectal Q4H PRN Amie Portland, MD      . carbidopa-levodopa (SINEMET IR) 25-100 MG per tablet immediate release 1 tablet  1 tablet Oral QID Amie Portland, MD      . famotidine (PEPCID) IVPB 20 mg premix  20 mg Intravenous Q12H Amie Portland, MD      . nicardipine (CARDENE) 20mg  in 0.86% saline 269ml IV infusion (0.1 mg/ml)  0-15 mg/hr Intravenous Continuous Amie Portland, MD 25 mL/hr at 05/05/18 2009 2.5 mg/hr at 05/05/18 2009  . senna-docusate (Senokot-S) tablet 1 tablet  1 tablet Oral BID Amie Portland, MD       Current Outpatient Medications  Medication Sig  Dispense Refill  . acetaminophen (TYLENOL) 500 MG tablet Take 1,000 mg by mouth 2 (two) times daily.     [name] amLODipine (NORVASC) 2.5 MG tablet Take 1 tablet (2.5 mg total) by mouth daily. (Patient taking differently: Take 2.5 mg by mouth daily as needed (BP >120/100). ) 90 tablet 1  . aspirin 81 MG chewable tablet Chew 81 mg by mouth daily as needed (chest pain).    [name] b complex vitamins tablet Take 1 tablet by mouth daily.     . Ca Phosphate-Cholecalciferol (CALCIUM/VITAMIN D3 GUMMIES) 250-350 MG-UNIT CHEW Chew 1 Dose by mouth 2 (two) times daily. (Patient taking differently: Chew 1 tablet by mouth 2 (two) times daily. ) 180 tablet 3  . carbidopa-levodopa (SINEMET IR) 25-100 MG tablet Take 1 tablet by mouth 4 (four) times daily. 120 tablet 2  . fish oil-omega-3 fatty acids 1000 MG capsule Take 2 g by mouth daily.     [name] loratadine (CLARITIN) 10 MG tablet Take 10 mg by mouth daily.    . memantine (NAMENDA XR) 28 MG CP24 24 hr capsule TAKE 1 CAPSULE BY MOUTH EVERY DAY IN THE MORNING (Patient taking differently: Take 28 mg by mouth daily. ) 30 capsule 11  . Multiple Vitamins-Minerals (MULTIVITAMIN WITH MINERALS) tablet Take 1 tablet by mouth daily.     . polyethylene glycol (MIRALAX / GLYCOLAX) packet Take 17 g by mouth daily as needed (constipation).    . potassium chloride SA (KLOR-CON M20) 20 MEQ tablet Take 2 tablets (40 mEq total) by mouth daily. 180 tablet 1  . QUEtiapine (SEROQUEL) 25 MG tablet TAKE 1 TABLET AT BEDTIME MAY USE 1/2 TABLET DURING THE DAY AS NEEDED FOR AGITATION/HALLUCINATIONS (Patient taking differently: Take 12.5 mg by mouth at bedtime. ) 90 tablet 1  . senna (SENOKOT) 8.6 MG TABS tablet Take 1 tablet (8.6 mg total) by mouth daily as needed for mild constipation. 120 each 0  . vitamin C (ASCORBIC ACID) 500 MG tablet Take 1,000 mg by mouth daily.     [name] warfarin (COUMADIN) 5 MG tablet Take 2.5-5 mg by mouth See admin instructions. Take one tablet (5 mg) by mouth on Monday, Wednesday,  Friday at 6 pm, take 1/2 tablet (2.5 mg) on Sunday, Tuesday, Thursday, Saturday at [place]: BP 128/74   Pulse 81   Temp 98.3  F (36.8 C)   Resp 20   Ht 5\' 8"  (1.727 m)   Wt 61.7 kg   SpO2 99%   BMI 20.68 kg/m   INTAKE / OUTPUT: I/O last 3 completed shifts: In: 541.4 [IV Piggyback:541.4] Out: -   PHYSICAL EXAMINATION: GENERAL: disoriented to time and place, inappropriate safety awareness, cooperative, flat affect. No acute distress. HEAD: normocephalic, atraumatic EYE: PERRLA, EOM intact, no scleral icterus, no pallor. NOSE: nares are patent. No exudate.  THROAT/ORAL CAVITY: Normal dentition. No oral thrush. No exudate. Mucous membranes are moist. No tonsillar enlargement. Mallampati class IV (only hard palate visible) airway. NECK: supple, no thyromegaly, no JVD, no lymphadenopathy. Trachea midline. CHEST/LUNG: symmetric in development and expansion. Good air entry. no crackles. No wheezes. HEART: Regular S1 and S2 without murmur, rub or gallop. ABDOMEN: soft, nontender, nondistended. Normoactive bowel sounds. No rebound. No guarding. No hepatosplenomegaly. EXTREMITIES: Edema: none. No cyanosis. No clubbing. 2+ DP pulses LYMPHATIC: no cervical/axillary/inguinal lymph nodes appreciated MUSCULOSKELETAL: No point tenderness. No bulk atrophy. Joints: normal. no joint tenderness, deformity or swelling. SKIN:  No rash or lesion. NEUROLOGIC: Doll's eyes intact. Corneal reflex intact. Spontaneous respirations intact. Cranial nerves II-XII are grossly symmetric and physiologic. Babinski absent. No sensory deficit. Motor: 4/5 @ RUE, 4/5 @ LUE, 3/5 @ RLL,  3/5 @ LLL.  DTR: 2+ @ R biceps, 2+ @ L biceps, 2+ @ R patellar,  2+ @ L patellar.  Increased rigidity x 4 extremities.  Intermittent pill-rolling tremor.  Gait was not assessed.   LABS:  BASIC METABOLIC PROFILE Recent Labs  Lab 05/05/18 1556  NA 138  K 6.5*  CL 103  CO2 27  BUN 10  CREATININE 0.94  GLUCOSE 107*    CALCIUM 9.3    Glucose Recent Labs  Lab 05/05/18 1943  GLUCAP 73    Liver Enzymes Recent Labs  Lab 05/05/18 1556  AST 60*  ALT 9  ALKPHOS 40  BILITOT 1.4*  ALBUMIN 3.6    CBC Recent Labs  Lab 05/05/18 1556  WBC 4.8  HGB 13.3  HCT 42.9  PLT 196    COAGULATION STUDIES Recent Labs  Lab 05/05/18 1707  INR 2.22    SEPSIS MARKERS Recent Labs  Lab 05/05/18 1603  LATICACIDVEN 1.24    CULTURES: Results for orders placed or performed during the hospital encounter of 11/12/17  Urine culture     Status: Abnormal   Collection Time: 11/12/17  4:14 PM  Result Value Ref Range Status   Specimen Description   Final    URINE, CLEAN CATCH Performed at [facility], [place] [address], [place], [place] [address]    Special Requests   Final    NONE Performed at [facility], [place] [address], [place], [place] [address]    Culture MULTIPLE SPECIES PRESENT, SUGGEST RECOLLECTION (A)  Final   Report Status 11/14/2017 FINAL  Final   *Note: Due to a large number of results and/or encounters for the requested time period, some results have not been displayed. A complete set of results can be found in Results Review.    IMAGING: Dg Chest 2 View  Result Date: 05/05/2018 CLINICAL DATA:  Shortness of Breath EXAM: CHEST - 2 VIEW COMPARISON:  November 12, 2017 FINDINGS: There is elevation of the left hemidiaphragm, stable. There is mild bibasilar atelectasis. There is no edema or consolidation. Heart size and pulmonary vascularity are normal. No adenopathy. There is a loop recorder on the left inferiorly and anteriorly.  No evident bone lesions. IMPRESSION: Stable elevation of left hemidiaphragm. Mild bibasilar atelectasis. No edema or consolidation. Stable cardiac silhouette. Electronically Signed   By: Lowella Grip [name] M.D.   On: 05/05/2018 15:35   Ct Head Wo Contrast  Result Date: 05/05/2018 CLINICAL DATA:  Altered mental status EXAM: CT  HEAD WITHOUT CONTRAST TECHNIQUE: Contiguous axial images were obtained from the base of the skull through the vertex without intravenous contrast. COMPARISON:  Head CT 11/12/2017 FINDINGS: Brain: There is a 18 x 10 mm focus of acute hemorrhage in the right posterior fossa, likely intraparenchymal. Mild atrophy for age. There is no acute or chronic infarction. There is hypoattenuation of the periventricular white matter, most commonly indicating chronic ischemic microangiopathy. Vascular: Atherosclerotic calcification of the internal carotid arteries at the skull base. No abnormal hyperdensity of the major intracranial arteries or dural venous sinuses. Skull: The visualized skull base, calvarium and extracranial soft tissues are normal. Sinuses/Orbits: No fluid levels or advanced mucosal thickening of the visualized paranasal sinuses. No mastoid or middle ear effusion. The orbits are normal. IMPRESSION: 1. Small focus of hemorrhage in the right posterior fossa, likely intraparenchymal within the cerebellum. No associated mass effect. 2. Severe chronic ischemic microangiopathy. Critical Value/emergent results were called by telephone at the time of interpretation on 05/05/2018 at 4:46 pm to Dr. Nanda Quinton , who verbally acknowledged these results. Electronically Signed   By: Ulyses Jarred M.D.   On: 05/05/2018 16:54    OTHER STUDIES:  12-lead EKG (05/05/2018): Normal sinus rhythm; right bundle branch block (chronic).  SIGNIFICANT EVENTS: 8/16: Presented to ER with diaphoresis, syncope  LINES/TUBES: Foley catheter inserted in ER   ASSESSMENT / PLAN: Principal Problem:   ICH (intracerebral hemorrhage) ([place]) Active Problems:   Hyperkalemia   CAD (coronary artery disease)   HLD (hyperlipidemia)   History of pulmonary embolism   Alzheimer's dementia   CARDIOVASCULAR  Coronary artery disease  RENAL  Hyperkalemia with normal renal function Stop potassium supplementation.  The patient is  already received insulin/D50 and calcium gluconate. Check room air ABG to assess acid-base status. Repeat BMP  HEMATOLOGIC  History of pulmonary embolism Hold warfarin DVT prophylaxis: SCDs  GASTROINTESTINAL  No acute issues GI prophylaxis: famotidine  ENDOCRINE  History of pituitary microadenoma  NEUROLOGIC  Acute right cerebellar hemorrhage Defer to neurology. Every hour neurochecks. Repeat head CT planned for 0030  PULMONARY  No acute issues Supplement oxygen as needed to maintain SpO2 93+%  INFECTIOUS  No acute issues   FAMILY  - Updates: family at bedside updated  - Inter-disciplinary family meet or Palliative Care meeting due by:  05/12/2018   Renee Pain, MD Board Certified by the [employer]IM, [place] Pager: [phone]  05/05/2018, 8:26 PM

## 2018-05-05 NOTE — ED Notes (Signed)
Patient transported to CT 

## 2018-05-05 NOTE — H&P (Addendum)
NEURO HOSPITALIST CONSULT NOTE   Requestig physician: Dr. Laverta [place]   Reason for Consult: Cerebellar bleed on CT scan   History obtained from:  Patient  Chart/ family  HPI:                                                                                                                                          [name] [name] is an 84 y.o. male  With PMH of CAD, HTN, HLD, dementia, PE (on warfarin), advanced parkinson's disease, CHF with LVEF of 40 to 45% documented in 2016, presented to [facility] ED for bradycardia. Neurology consulted for hemorrhage found on CT head.  Patient was at home when he had an acute onset of diaphoresis and decreased alertness. Per family patient is very slow to respond at baseline but was not complaining of pain. Denies fever, any recent illness or any recent falls. His home health aid states that he did fall about 1 month ago, but she protected his head really well with her hands. Does not believe that he hit his head other than against her hands. He has had similar sweating with bradycardic events and hypotension in the past. Per family patient has a loop recorder but is not a good candidate for a pacemaker. Patient currently taking coumadin.  Ed course: Potassium: 6.5, BG: 107, AST: 60 , total bili: 1.4 CT head:  Small focus of hemorrhage in the right posterior fossa, likely intraparenchymal within the cerebellum. No associated mass effect. Severe chronic ischemic microangiopathy.   Past Medical History:  Diagnosis Date  . Anxiety   . Coronary artery disease, non-occlusive    with history of MI; Cath 2008 w multivessel nonobstructive CAD  . Dementia   . Depression   . GERD (gastroesophageal reflux disease)   . Hyperlipidemia   . Hypertension   . Memory loss   . Parkinson disease ([place])   . PE (pulmonary embolism)    unprovoked PE completed 6 months of warfarin, warfarin d/ced 02/12/2010, repeat PE 07/10/2010 after a long car ride and now on  lifelong coumadin.  . Pituitary microadenoma ([place])    incidental finding CT 12/2009  . Prostate cancer ([place])   . Scoliosis   . Sigmoid volvulus ([place]) 08/02/2016  . Volvulus of colon ([place]) 06/01/2017    Past Surgical History:  Procedure Laterality Date  . CATARACT EXTRACTION    . COLONOSCOPY N/A 05/30/2017   Procedure: COLONOSCOPY;  Surgeon: Ronald Lobo, MD;  Location: [facility] ENDOSCOPY;  Service: Endoscopy;  Laterality: N/A;  . FLEXIBLE SIGMOIDOSCOPY N/A 08/02/2016   Procedure: FLEXIBLE SIGMOIDOSCOPY;  Surgeon: Ronald Lobo, MD;  Location: [facility] ENDOSCOPY;  Service: Endoscopy;  Laterality: N/A;  . FLEXIBLE SIGMOIDOSCOPY N/A 08/06/2016   Procedure: FLEXIBLE SIGMOIDOSCOPY;  Surgeon: Ronald Lobo, MD;  Location: [place];  Service: Endoscopy;  Laterality: N/A;  . FLEXIBLE SIGMOIDOSCOPY Left 06/02/2017   Procedure: FLEXIBLE SIGMOIDOSCOPY;  Surgeon: Arta Silence, MD;  Location: [facility] ENDOSCOPY;  Service: Endoscopy;  Laterality: Left;  . FLEXIBLE SIGMOIDOSCOPY N/A 06/07/2017   Procedure: FLEXIBLE SIGMOIDOSCOPY for possible decompression;  Surgeon: Otis Brace, MD;  Location: [place];  Service: Gastroenterology;  Laterality: N/A;  . LEFT HEART CATHETERIZATION WITH CORONARY ANGIOGRAM N/A 12/10/2014   Procedure: LEFT HEART CATHETERIZATION WITH CORONARY ANGIOGRAM;  Surgeon: [name], MD;  Location: [facility] CATH LAB;  Service: Cardiovascular;  Laterality: N/A;  . LOOP RECORDER IMPLANT N/A 12/16/2014   Procedure: LOOP RECORDER IMPLANT;  Surgeon: [name], MD;  Location: [facility] CATH LAB;  Service: Cardiovascular;  Laterality: N/A;  . PARTIAL COLECTOMY N/A 06/08/2017   Procedure: SIGMOID  COLECTOMY;  Surgeon: Georganna Skeans, MD;  Location: [place];  Service: General;  Laterality: N/A;  . PROSTATECTOMY      Family History  Problem Relation Age of Onset  . Hypertension Father        Passed away from cerebral hemorrhage at age of 37.  [name] Dementia Mother   . Hypertension Child        4  adult children  . Cervical cancer Other   . Lung cancer Other   . Stroke Other   . Heart attack Neg Hx          Social History:  reports that he has never smoked. He has never used smokeless tobacco. He reports that he does not drink alcohol or use drugs.  Allergies  Allergen Reactions  . Aricept [Donepezil Hcl] Other (See Comments)    Symptomatic Bradycardia.  . Other Other (See Comments)    Shrimp gives him gout  . Shellfish-Derived Products Other (See Comments)    Causes a flare up with Gout    MEDICATIONS:                                                                                                                     Current Facility-Administered Medications  Medication Dose Route Frequency Provider Last Rate Last Dose  .  stroke: mapping our early stages of recovery book   Does not apply Once Amie Portland, MD      . acetaminophen (TYLENOL) tablet 650 mg  650 mg Oral Q4H PRN Amie Portland, MD       Or  . acetaminophen (TYLENOL) solution 650 mg  650 mg Per Tube Q4H PRN Amie Portland, MD       Or  . acetaminophen (TYLENOL) suppository 650 mg  650 mg Rectal Q4H PRN Amie Portland, MD      . carbidopa-levodopa (SINEMET IR) 25-100 MG per tablet immediate release 1 tablet  1 tablet Oral QID Amie Portland, MD      . famotidine (PEPCID) IVPB 20 mg premix  20 mg Intravenous Q12H Amie Portland, MD      . labetalol (NORMODYNE,TRANDATE) injection 20 mg  20 mg Intravenous Once Amie Portland, MD  And  . nicardipine (CARDENE) 20mg  in 0.86% saline 25ml IV infusion (0.1 mg/ml)  0-15 mg/hr Intravenous Continuous Amie Portland, MD      . nicardipine (CARDENE) 20mg  in 0.86% saline 279ml IV infusion (0.1 mg/ml)  0-15 mg/hr Intravenous Continuous Amie Portland, MD      . phytonadione (VITAMIN K) 10 mg in dextrose 5 % 50 mL IVPB  10 mg Intravenous STAT Amie Portland, MD      . prothrombin complex conc human (KCENTRA) IVPB 1,500 Units  1,500 Units Intravenous Once Amie Portland, MD      .  senna-docusate (Senokot-S) tablet 1 tablet  1 tablet Oral BID Amie Portland, MD       Current Outpatient Medications  Medication Sig Dispense Refill  . acetaminophen (TYLENOL) 500 MG tablet Take 1,000 mg by mouth 2 (two) times daily.     [name] amLODipine (NORVASC) 2.5 MG tablet Take 1 tablet (2.5 mg total) by mouth daily. 90 tablet 1  . b complex vitamins tablet Take 1 tablet by mouth every morning.     . Ca Phosphate-Cholecalciferol (CALCIUM/VITAMIN D3 GUMMIES) 250-350 MG-UNIT CHEW Chew 1 Dose by mouth 2 (two) times daily. 180 tablet 3  . carbidopa-levodopa (SINEMET IR) 25-100 MG tablet Take 1 tablet by mouth 4 (four) times daily. 120 tablet 2  . fish oil-omega-3 fatty acids 1000 MG capsule Take 2 g by mouth every morning.     . memantine (NAMENDA XR) 28 MG CP24 24 hr capsule TAKE 1 CAPSULE BY MOUTH EVERY DAY IN THE MORNING 30 capsule 11  . Multiple Vitamins-Minerals (MULTIVITAMIN WITH MINERALS) tablet Take 1 tablet by mouth every morning.     . polyethylene glycol (MIRALAX / GLYCOLAX) packet Take 17 g by mouth daily as needed (constipation).    . potassium chloride SA (KLOR-CON M20) 20 MEQ tablet Take 2 tablets (40 mEq total) by mouth daily. 180 tablet 1  . QUEtiapine (SEROQUEL) 25 MG tablet TAKE 1 TABLET AT BEDTIME MAY USE 1/2 TABLET DURING THE DAY AS NEEDED FOR AGITATION/HALLUCINATIONS 90 tablet 1  . senna (SENOKOT) 8.6 MG TABS tablet Take 1 tablet (8.6 mg total) by mouth daily as needed for mild constipation. 120 each 0  . vitamin C (ASCORBIC ACID) 500 MG tablet Take 1,000 mg by mouth every morning.       ROS:                                                                                                                                       unobtainable from patient due to mental status  Blood pressure (!) 157/127, pulse 79, resp. rate 16, height 5\' 8"  (1.727 m), weight 61.7 kg, SpO2 100 %.  General Examination:  Physical Exam  HEENT-  Normocephalic, no lesions, without obvious abnormality.  Normal external eye and conjunctiva.   Cardiovascular- S1-S2 audible, pulses palpable throughout   Lungs-no rhonchi or wheezing noted, no excessive working breathing.  Saturations within normal limits on RA Extremities- Warm, dry and intact Skin-warm and dry, intact  Neurological Examination Mental Status: Alert, slightly disoriented. patient slow to respond to some questions, but able to speak. Unable to state his name, but did state his fathers name.  Able to follow  commands slowly. Cranial Nerves: Patient able to track examiner around room. PERRL. Face symmetric  Motor/sensory Right : Upper extremity   4/5    Left:     Upper extremity   4/5  Lower extremity   3/5     Lower extremity   3/5 Patient with increased tone and rigidity in all 4 extremities.  Cog wheeling present in BUE as well as tremor in BUE.   Lab Results: Basic Metabolic Panel: Potassium 6.5 Recent Labs  Lab 05/05/18 1556  NA 138  K 6.5*  CL 103  CO2 27  GLUCOSE 107*  BUN 10  CREATININE 0.94  CALCIUM 9.3    CBC: Recent Labs  Lab 05/05/18 1556  WBC 4.8  NEUTROABS 3.5  HGB 13.3  HCT 42.9  MCV 100.2*  PLT 196   Current PT/INR pending.  Last INR 04/17/2018 2.9.  Imaging: Dg Chest 2 View  Result Date: 05/05/2018 CLINICAL DATA:  Shortness of Breath EXAM: CHEST - 2 VIEW COMPARISON:  November 12, 2017 FINDINGS: There is elevation of the left hemidiaphragm, stable. There is mild bibasilar atelectasis. There is no edema or consolidation. Heart size and pulmonary vascularity are normal. No adenopathy. There is a loop recorder on the left inferiorly and anteriorly. No evident bone lesions. IMPRESSION: Stable elevation of left hemidiaphragm. Mild bibasilar atelectasis. No edema or consolidation. Stable cardiac silhouette. Electronically Signed   By: Lowella Grip [name] M.D.   On: 05/05/2018 15:35   Ct Head Wo  Contrast  Result Date: 05/05/2018 CLINICAL DATA:  Altered mental status EXAM: CT HEAD WITHOUT CONTRAST TECHNIQUE: Contiguous axial images were obtained from the base of the skull through the vertex without intravenous contrast. COMPARISON:  Head CT 11/12/2017 FINDINGS: Brain: There is a 18 x 10 mm focus of acute hemorrhage in the right posterior fossa, likely intraparenchymal. Mild atrophy for age. There is no acute or chronic infarction. There is hypoattenuation of the periventricular white matter, most commonly indicating chronic ischemic microangiopathy. Vascular: Atherosclerotic calcification of the internal carotid arteries at the skull base. No abnormal hyperdensity of the major intracranial arteries or dural venous sinuses. Skull: The visualized skull base, calvarium and extracranial soft tissues are normal. Sinuses/Orbits: No fluid levels or advanced mucosal thickening of the visualized paranasal sinuses. No mastoid or middle ear effusion. The orbits are normal. IMPRESSION: 1. Small focus of hemorrhage in the right posterior fossa, likely intraparenchymal within the cerebellum. No associated mass effect. 2. Severe chronic ischemic microangiopathy. Critical Value/emergent results were called by telephone at the time of interpretation on 05/05/2018 at 4:46 pm to Dr. Nanda Quinton , who verbally acknowledged these results. Electronically Signed   By: Ulyses Jarred M.D.   On: 05/05/2018 16:54    Laurey Morale, MSN, NP-C [facility] [phone]  Attending neurologist's note to follow   05/05/2018, 5:34 PM  Attending addendum Patient seen and examined. History and physical reviewed and agree. Imaging reviewed.  Small focus of hemorrhage in the right cerebellum likely IPH  within the cerebellum. Patient currently in Coumadin. See assessment and plan below.  Assessment:  84 y.o. male  With PMH of CAD, HTN, HLD, dementia, PE (on warfarin), advanced parkinson's disease presented to [facility]  ED for bradycardia. Neurology consulted for hemorrhage found on CT head. CT head revealed: Small focus of hemorrhage in the right posterior fossa, likelyintraparenchymal within the cerebellum. No associated mass effect.Severe chronic ischemic microangiopathy. This could be a spontaneous ICH in the setting of uncontrolled hypertension and anticoagulation versus traumatic ICH but there is no clear report of head trauma recently.. It is my opinion that this is probably an incidental finding, but the fact that the patient is on Coumadin and was therapeutic with the last INR check-current INR pending, I think this warrants reversal and observation in the neuro ICU.  Plan: Cerebellum ICH, nontraumatic IVH, nontraumatic  Acuity: Acute Laterality: Right cerebellum Current suspected etiology: Hypertension, colopathy,? Trauma Treatment: -Admit to neurological ICU -ICH Score: 2 -BP control goal SYS<140 -neuromonitoring -Reversal of Coumadin with Kcentra. -Discontinue Coumadin -Repeat CT head in 8 hours -MRI brain with without contrast when stable.  CNS Cerebral edema Compression of brain -Close neuro monitoring  Dysarthria Dysphagia following ICH  -NPO until cleared by speech -ST -Advance diet as tolerated -May need PEG  Toxic encephalopathy -Correct metabolic causes -Monitor  -Continue PT/OT/ST  Parkinson's disease - Continue home Sinemet - PT OT ST   RESP No acute issues.  Monitor clinically. -History of PE.  On Coumadin.  Coumadin discontinued because of cerebellar bleed. -PCCM consulted.  Will appreciate opinion on need and timing of anticoagulation.  CV Bradycardia Congestive heart failure-systolic  -2D echocardiogram - Correct metabolic derangements. -Aggressive blood pressure control with systolic blood pressure less than 140.  Labetalol as needed and Cardene or Cleviprex drip. -PCCM consult appreciated with medical management.  Called in consult to Dr. Pearline Cables, who  said that PCCM service will see the patient overnight.  GI/GU Hyperbilirubinemia-bilirubin 1.4 Repeat labs  Gentle hydration Check labs in the morning  HEME Coagulopathy secondary to anticoagulation -Goal INR is 1 -Reverse with K Centra -Repeat PT/INR monitoring per reversal protocol.  ENDO No active issues -goal HgbA1c < 7  Fluid/Electrolyte Disorders Hyperkalemia - Patient treated with insulin, D50 and calcium gluconate in the ER. -We will request PCCM consultation for medical management while in the ICU.  ID Possible Aspiration PNA -CXR -NPO -Check urinalysis    Prophylaxis DVT: SCDs- no antiplatelets or anticoagulants as he is an active ICH. GI: Not applicable Bowel: Docusate, senna  Dispo: To be determined  Diet: NPO until cleared by bedside swallow or speech eval  Code Status: Full Code-confirmed with the daughter at bedside  I have answered all the questions the daughter.  She was very tearful as he has lost her mother a few months ago and was very scared for her father currently. She has confirmed that he is full code.   Consultations called: -[place] for [place].  -- Amie Portland, MD [facility] Pager: [phone] If 7pm to 7am, please call on call as listed on AMION.  CRITICAL CARE ATTESTATION This patient is critically ill and at significant risk of neurological worsening, death and care requires constant monitoring of vital signs, hemodynamics,respiratory and cardiac monitoring. I spent 40  minutes of neurocritical care time performing neurological assessment, discussion with family, other specialists and medical decision making of high complexity in the care of  this patient.   Addendum for coding clarification 05/15/18 The nutrition part above in  the plan, that has been striked out, was mistakenly left in the note initially.  Please disregard that part.  -- Amie Portland, MD [facility] Pager:  [phone] If 7pm to 7am, please call on call as listed on AMION.

## 2018-05-05 NOTE — ED Triage Notes (Signed)
Pt arrives to ED from home with complaints of family reports of "cold and clammy" that they noticed this afternoon. EMS reports pt has parkinsons and some days he is verbal and some days he is not, today he is not. Pt placed in position of comfort with bed locked and lowered, call bell in reach.

## 2018-05-06 ENCOUNTER — Inpatient Hospital Stay (HOSPITAL_COMMUNITY): Payer: Medicare Other

## 2018-05-06 DIAGNOSIS — I619 Nontraumatic intracerebral hemorrhage, unspecified: Secondary | ICD-10-CM

## 2018-05-06 LAB — GLUCOSE, CAPILLARY
GLUCOSE-CAPILLARY: 101 mg/dL — AB (ref 70–99)
Glucose-Capillary: 84 mg/dL (ref 70–99)
Glucose-Capillary: 91 mg/dL (ref 70–99)
Glucose-Capillary: 89 mg/dL (ref 70–99)

## 2018-05-06 LAB — BASIC METABOLIC PANEL
Anion gap: 12 (ref 5–15)
BUN: 12 mg/dL (ref 8–23)
CHLORIDE: 105 mmol/L (ref 98–111)
CO2: 23 mmol/L (ref 22–32)
Calcium: 9.5 mg/dL (ref 8.9–10.3)
Creatinine, Ser: 0.79 mg/dL (ref 0.61–1.24)
GFR calc non Af Amer: >60 mL/min (ref >60)
Glucose, Bld: 80 mg/dL (ref 70–99)
POTASSIUM: 4.4 mmol/L (ref 3.5–5.1)
SODIUM: 140 mmol/L (ref 135–145)

## 2018-05-06 LAB — CBC
HCT: 37.7 % — ABNORMAL LOW (ref 39.0–52.0)
Hemoglobin: 12 g/dL — ABNORMAL LOW (ref 13.0–17.0)
MCH: 30.8 pg (ref 26.0–34.0)
MCHC: 31.8 g/dL (ref 30.0–36.0)
MCV: 96.9 fL (ref 78.0–100.0)
PLATELETS: 158 10*3/uL (ref 150–400)
RBC: 3.89 MIL/uL — AB (ref 4.22–5.81)
RDW: 14.3 % (ref 11.5–15.5)
WBC: 4.7 10*3/uL (ref 4.0–10.5)

## 2018-05-06 LAB — MRSA PCR SCREENING: MRSA BY PCR: NEGATIVE

## 2018-05-06 LAB — MAGNESIUM: MAGNESIUM: 1.6 mg/dL — AB (ref 1.7–2.4)

## 2018-05-06 LAB — PHOSPHORUS: PHOSPHORUS: 3.4 mg/dL (ref 2.5–4.6)

## 2018-05-06 LAB — URINE CULTURE
Culture: >=100000 — AB
Special Requests: NORMAL

## 2018-05-06 LAB — PROTIME-INR
INR: 1.41
PROTHROMBIN TIME: 17.2 s — AB (ref 11.4–15.2)

## 2018-05-06 MED ORDER — QUETIAPINE FUMARATE 25 MG PO TABS
25.0000 mg | ORAL_TABLET | Freq: Every day | ORAL | Status: DC
  Administered 2018-05-07 – 2018-05-08 (×2): 25 mg via ORAL
  Filled 2018-05-06 (×2): qty 1

## 2018-05-06 NOTE — Evaluation (Addendum)
Clinical/Bedside Swallow Evaluation Patient Details  Name: [name] [name] MRN: [id number] Date of Birth: 07-29-1931  Today's Date: 05/06/2018 Time: SLP Start Time (ACUTE ONLY): 5361 SLP Stop Time (ACUTE ONLY): 1339 SLP Time Calculation (min) (ACUTE ONLY): 17 min  Past Medical History:  Past Medical History:  Diagnosis Date  . Anxiety   . Coronary artery disease, non-occlusive    with history of MI; Cath 2008 w multivessel nonobstructive CAD  . Dementia   . Depression   . GERD (gastroesophageal reflux disease)   . Hyperlipidemia   . Hypertension   . Memory loss   . Parkinson disease ([place])   . PE (pulmonary embolism)    unprovoked PE completed 6 months of warfarin, warfarin d/ced 02/12/2010, repeat PE 07/10/2010 after a long car ride and now on lifelong coumadin.  . Pituitary microadenoma ([place])    incidental finding CT 12/2009  . Prostate cancer ([place])   . Scoliosis   . Sigmoid volvulus ([place]) 08/02/2016  . Volvulus of colon ([place]) 06/01/2017   Past Surgical History:  Past Surgical History:  Procedure Laterality Date  . CATARACT EXTRACTION    . COLONOSCOPY N/A 05/30/2017   Procedure: COLONOSCOPY;  Surgeon: [name] Lobo, MD;  Location: [facility] ENDOSCOPY;  Service: Endoscopy;  Laterality: N/A;  . FLEXIBLE SIGMOIDOSCOPY N/A 08/02/2016   Procedure: FLEXIBLE SIGMOIDOSCOPY;  Surgeon: [name] Lobo, MD;  Location: [facility] ENDOSCOPY;  Service: Endoscopy;  Laterality: N/A;  . FLEXIBLE SIGMOIDOSCOPY N/A 08/06/2016   Procedure: FLEXIBLE SIGMOIDOSCOPY;  Surgeon: [name] Lobo, MD;  Location: [facility] ENDOSCOPY;  Service: Endoscopy;  Laterality: N/A;  . FLEXIBLE SIGMOIDOSCOPY Left 06/02/2017   Procedure: FLEXIBLE SIGMOIDOSCOPY;  Surgeon: [name] Silence, MD;  Location: [facility] ENDOSCOPY;  Service: Endoscopy;  Laterality: Left;  . FLEXIBLE SIGMOIDOSCOPY N/A 06/07/2017   Procedure: FLEXIBLE SIGMOIDOSCOPY for possible decompression;  Surgeon: [name] Brace, MD;  Location: [place];  Service:  Gastroenterology;  Laterality: N/A;  . LEFT HEART CATHETERIZATION WITH CORONARY ANGIOGRAM N/A 12/10/2014   Procedure: LEFT HEART CATHETERIZATION WITH CORONARY ANGIOGRAM;  Surgeon: [name] Sine, MD;  Location: [facility] CATH LAB;  Service: Cardiovascular;  Laterality: N/A;  . LOOP RECORDER IMPLANT N/A 12/16/2014   Procedure: LOOP RECORDER IMPLANT;  Surgeon: [name] Sprang, MD;  Location: [facility] CATH LAB;  Service: Cardiovascular;  Laterality: N/A;  . PARTIAL COLECTOMY N/A 06/08/2017   Procedure: SIGMOID  COLECTOMY;  Surgeon: [name] Skeans, MD;  Location: [place];  Service: General;  Laterality: N/A;  . PROSTATECTOMY     HPI:  84 year old male with h/o dementia, GERD, Parkinson's disease, admitted with syncope.  Initial evaluation revealed small posterior cerebellar hemorrhage.    Assessment / Plan / Recommendation Clinical Impression  Bedside swallow evaluation complete, revealing a functional oropharyngeal swallow with mildly prolonged but intact mastication of regular texture solids and no overt indication of aspiraiton. Note that patient has previously had a MBS which indicated silent penetration of thin liquids, recommendations for nectar thick. Son in room however and reports that prior to admission, patient has been on a mechanical soft diet with thin liquids without any observed difficulty or reports of PNA/respiratory illness. Plan to continue this diet at this time. Son in agreement. Given history, will f/u for tolerance.    Regarding cognitive linguistic evaluation, per son, patient with impaired cognition and speech intelligibility at baseline due to combination of Parkinson's and dementia, unchanged from baseline. No SLE warranted.   SLP Visit Diagnosis: Dysphagia, oropharyngeal phase (R13.12)    Aspiration Risk  Moderate aspiration risk  Diet Recommendation Dysphagia 3 (Mech soft);Thin liquid   Liquid Administration via: Cup;Straw Medication Administration: Crushed with puree(as per  son) Supervision: Staff to assist with self feeding;Full supervision/cueing for compensatory strategies Compensations: Slow rate;Small sips/bites Postural Changes: Seated upright at 90 degrees    Other  Recommendations Oral Care Recommendations: Oral care BID   Follow up Recommendations None      Frequency and Duration min 1 x/week  1 week           Swallow Study   General HPI: 84 year old male with h/o dementia, GERD, Parkinson's disease, admitted with syncope.  Initial evaluation revealed small posterior cerebellar hemorrhage.  Type of Study: Bedside Swallow Evaluation Previous Swallow Assessment: none Diet Prior to this Study: NPO Temperature Spikes Noted: No Respiratory Status: Room air History of Recent Intubation: No Behavior/Cognition: Alert;Cooperative;Pleasant mood;Confused;Requires cueing Oral Cavity Assessment: Within Functional Limits Oral Care Completed by SLP: Recent completion by staff Oral Cavity - Dentition: Adequate natural dentition Vision: Functional for self-feeding Self-Feeding Abilities: Able to feed self Patient Positioning: Upright in bed Baseline Vocal Quality: Low vocal intensity Volitional Cough: Cognitively unable to elicit Volitional Swallow: Unable to elicit    Oral/Motor/Sensory Function Overall Oral Motor/Sensory Function: Generalized oral weakness(labial, lingual tremors)   Ice Chips Ice chips: Within functional limits   Thin Liquid Thin Liquid: Within functional limits Presentation: Cup;Self Fed;Straw    Nectar Thick Nectar Thick Liquid: Not tested   Honey Thick Honey Thick Liquid: Not tested   Puree Puree: Within functional limits Presentation: Spoon   Solid     Solid: Within functional limits(mildly prolonged oral transit) Presentation: Self Fed     [name] Barberi MA, CCC-SLP   [name] [name] [name] [name] 05/06/2018,1:55 PM

## 2018-05-06 NOTE — Progress Notes (Addendum)
STROKE TEAM PROGRESS NOTE   HISTORY OF PRESENT ILLNESS (per record) [name] [name] is an 84 y.o. male  With PMH of CAD, HTN, HLD, dementia, PE (on warfarin), advanced parkinson's disease, CHF with LVEF of 40 to 45% documented in 2016, presented to [facility] ED for bradycardia. Neurology consulted for hemorrhage found on CT head.  Patient was at home when he had an acute onset of diaphoresis and decreased alertness. Per family patient is very slow to respond at baseline but was not complaining of pain. Denies fever, any recent illness or any recent falls. His home health aid states that he did fall about 1 month ago, but she protected his head really well with her hands. Does not believe that he hit his head other than against her hands. He has had similar sweating with bradycardic events and hypotension in the past. Per family patient has a loop recorder but is not a good candidate for a pacemaker. Patient currently taking coumadin.  Ed course: Potassium: 6.5, BG: 107, AST: 60 , total bili: 1.4 CT head:  Small focus of hemorrhage in the right posterior fossa, likely intraparenchymal within the cerebellum. No associated mass effect. Severe chronic ischemic microangiopathy.   SUBJECTIVE (INTERVAL HISTORY) Not in any acute distress    OBJECTIVE Vitals:   05/06/18 0830 05/06/18 0845 05/06/18 0900 05/06/18 0915  BP: 117/73 100/71 124/84 125/83  Pulse: 64 61 67 66  Resp: 15 12 16 18   Temp:      TempSrc:      SpO2: 99% 99% 99% 99%  Weight:      Height:        CBC:  Recent Labs  Lab 05/05/18 1556 05/06/18 0622  WBC 4.8 4.7  NEUTROABS 3.5  --   HGB 13.3 12.0*  HCT 42.9 37.7*  MCV 100.2* 96.9  PLT 196 643    Basic Metabolic Panel:  Recent Labs  Lab 05/05/18 1945 05/06/18 0305  NA 139 140  K 4.3 4.4  CL 108 105  CO2 23 23  GLUCOSE 72 80  BUN 10 12  CREATININE 0.82 0.79  CALCIUM 9.4 9.5  MG 1.7 1.6*  PHOS  --  3.4    Lipid Panel:     Component Value Date/Time   CHOL  242 (H) 05/05/2018 1556   CHOL 183 07/08/2016 1035   TRIG 157 (H) 05/05/2018 1556   HDL 63 05/05/2018 1556   HDL 82 07/08/2016 1035   CHOLHDL 3.8 05/05/2018 1556   VLDL 31 05/05/2018 1556   LDLCALC 148 (H) 05/05/2018 1556   LDLCALC 82 07/08/2016 1035   HgbA1c:  Lab Results  Component Value Date   HGBA1C 5.9 (H) 05/05/2018   Urine Drug Screen:     Component Value Date/Time   LABOPIA NONE DETECTED 08/11/2011 2331   COCAINSCRNUR NONE DETECTED 08/11/2011 2331   LABBENZ NONE DETECTED 08/11/2011 2331   AMPHETMU NONE DETECTED 08/11/2011 2331   THCU NONE DETECTED 08/11/2011 2331   LABBARB NONE DETECTED 08/11/2011 2331    Alcohol Level     Component Value Date/Time   ETH <10 11/12/2017 1656    IMAGING   Dg Chest 2 View 05/05/2018 IMPRESSION:  Stable elevation of left hemidiaphragm. Mild bibasilar atelectasis. No edema or consolidation. Stable cardiac silhouette.  Ct Head Wo Contrast 05/06/2018 IMPRESSION:  Stable appearance of focal hyperdensity in the right posterior fossa. This could represent a cerebellar hemorrhage or calcified meningioma. No progression.   Ct Head Wo Contrast 05/05/2018 IMPRESSION:  1. Small  focus of hemorrhage in the right posterior fossa, likely intraparenchymal within the cerebellum. No associated mass effect.  2. Severe chronic ischemic microangiopathy.    MRI Brain Wo Contrast 05/06/2018 IMPRESSION: 1. Subacute hematoma right cerebellum with methemoglobin formation and surrounding edema. 2. Advanced atrophy and advanced chronic microvascular ischemia 3. Enlarged pituitary 13 mm consistent with adenoma.    PHYSICAL EXAM Blood pressure 125/83, pulse 66, temperature (!) 97.3 F (36.3 C), resp. rate 18, height 5\' 8"  (1.727 m), weight 61.7 kg, SpO2 99 %.       ASSESSMENT/PLAN [name] is an 84 y.o. male with history of CAD, HTN, HLD, dementia, PE (on warfarin), advanced parkinson's disease, CHF with LVEF of 40 to 45% documented  in 2016 presenting with bradycardia and found to have hemorrhage on Head CT.  He did not receive IV t-PA due to [place].  Cerebellum ICH - non traumatic.  Resultant -   CT head - Small focus of hemorrhage in the right posterior fossa, likely intraparenchymal within the cerebellum.  MRI head - Subacute hematoma right cerebellum with methemoglobin formation and surrounding edema.  MRA head - not performed  Carotid Doppler - not indicated  2D Echo - not indicated  LDL - 148  HgbA1c - 5.9  VTE prophylaxis - SCDs  Diet - Dysphagia 3 thin liquids  warfarin daily also on ASA 81 mg daily prior to admission, now on No antithrombotic  Patient counseled to be compliant with his antithrombotic medications  Ongoing aggressive stroke risk factor management  Therapy recommendations:  pending  Disposition:  Pending  Hypertension  BP mildly low - Systolic~ 102 . Permissive hypertension (OK if < 220/120) but gradually normalize in 5-7 days . Long-term BP goal normotensive  Hyperlipidemia  Lipid lowering medication PTA:  none  LDL 148, goal < 70  Current lipid lowering medication: none - consider adding statin at discharge.  Other Stroke Risk Factors  Advanced age  Coronary artery disease  Warfarin  Other Active Problems  Hx of PE  Parkinson's disease   PLAN  Stat MRI today as above - hold anticoagulation for now.  Transfer to floor later today.  Speech therapy evaluation.  SBP < 140  Hyperkalemia resolved - was on KCL 40 meq daily PTA  Bradycardia most likely related to PD, asymptomatic from it, continue to monitor  Speech Eval today  As per daughter who is at bedside, states he father is at his baseline   Exam stable Awake, alert and oriented to self PERRL B/l UE tremors 4/5 stregth B/L LE 3/5  - Repeat CTH appears stable.  - Will get MRI brain today due to possibility that the [place] may be a calcified meningioma.  -If calcified meningioma will  restart Coumadin. If not will plan to restart coumadin in 14 days post bleed as he needs it for hx of PE and Afib  Addendum:  - MRI shows subacute hematoma consistent with ICH. Would recommend to hold anticoagulation for 14 days from date of onset before restarting.   Ok to transfer to the floor  Hospital day # 1    To contact Stroke Continuity provider, please refer to [email or web address] After hours, contact General Neurology

## 2018-05-06 NOTE — Progress Notes (Signed)
PULMONARY / CRITICAL CARE MEDICINE   Name: [name] [name] MRN: [id number] DOB: 17-Mar-1931    ADMISSION DATE:  05/05/2018   REFERRING MD:  Amie Portland  CHIEF COMPLAINT: posterior fossa ICH and hyperkalemia  HISTORY OF PRESENT ILLNESS:      This 84 y.o. African-American male non-smoker is seen in consultation at the request of Dr. Amie Portland for recommendations on further evaluation and management of hyperkalemia.  The patient lives at home with 1 of his daughters, who serves as primary caregiver.  He was noted to be clammy and diaphoretic earlier today apparently had a syncopal episode, prompting presentation to the emergency department.  He has advanced Parkinson's disease with dementia at baseline.  He is verbal but difficult to understand.  He does have gait instability and requires assistance for ambulation.  Initial evaluation in the emergency department revealed that he has a small posterior cerebellar hemorrhage.  No mass-effect.  Laboratory evaluation revealed hyperkalemia and (K 6.5).  INR 2.2.  The patient is on warfarin for history of pulmonary embolism.  In the emergency department, Eppie Gibson has already been administered.     This morning he is lethargic, but his daughter tells me he is at baseline. He is off Cardene for BP control. Repeat CT this morning shows no expansion of his posterior fossa hematoma.    PAST MEDICAL HISTORY :  He  has a past medical history of Anxiety, Coronary artery disease, non-occlusive, Dementia, Depression, GERD (gastroesophageal reflux disease), Hyperlipidemia, Hypertension, Memory loss, Parkinson disease ([place]), PE (pulmonary embolism), Pituitary microadenoma ([place]), Prostate cancer ([place]), Scoliosis, Sigmoid volvulus ([place]) (08/02/2016), and Volvulus of colon ([place]) (06/01/2017).  PAST SURGICAL HISTORY: He  has a past surgical history that includes Prostatectomy; left heart catheterization with coronary angiogram (N/A, 12/10/2014); loop recorder  implant (N/A, 12/16/2014); Flexible sigmoidoscopy (N/A, 08/02/2016); Flexible sigmoidoscopy (N/A, 08/06/2016); Colonoscopy (N/A, 05/30/2017); Cataract extraction; Flexible sigmoidoscopy (Left, 06/02/2017); Flexible sigmoidoscopy (N/A, 06/07/2017); and Partial colectomy (N/A, 06/08/2017).  Allergies  Allergen Reactions  . Aricept [Donepezil Hcl] Other (See Comments)    Symptomatic Bradycardia.  . Shrimp [Shellfish Allergy] Other (See Comments)    Causes gout    No current facility-administered medications on file prior to encounter.    Current Outpatient Medications on File Prior to Encounter  Medication Sig  . acetaminophen (TYLENOL) 500 MG tablet Take 1,000 mg by mouth 2 (two) times daily.   [name] amLODipine (NORVASC) 2.5 MG tablet Take 1 tablet (2.5 mg total) by mouth daily. (Patient taking differently: Take 2.5 mg by mouth daily as needed (BP >120/100). )  . aspirin 81 MG chewable tablet Chew 81 mg by mouth daily as needed (chest pain).  [name] b complex vitamins tablet Take 1 tablet by mouth daily.   . Ca Phosphate-Cholecalciferol (CALCIUM/VITAMIN D3 GUMMIES) 250-350 MG-UNIT CHEW Chew 1 Dose by mouth 2 (two) times daily. (Patient taking differently: Chew 1 tablet by mouth 2 (two) times daily. )  . carbidopa-levodopa (SINEMET IR) 25-100 MG tablet Take 1 tablet by mouth 4 (four) times daily.  . fish oil-omega-3 fatty acids 1000 MG capsule Take 2 g by mouth daily.   [name] loratadine (CLARITIN) 10 MG tablet Take 10 mg by mouth daily.  . memantine (NAMENDA XR) 28 MG CP24 24 hr capsule TAKE 1 CAPSULE BY MOUTH EVERY DAY IN THE MORNING (Patient taking differently: Take 28 mg by mouth daily. )  . Multiple Vitamins-Minerals (MULTIVITAMIN WITH MINERALS) tablet Take 1 tablet by mouth daily.   . polyethylene glycol (MIRALAX / GLYCOLAX)  packet Take 17 g by mouth daily as needed (constipation).  . potassium chloride SA (KLOR-CON M20) 20 MEQ tablet Take 2 tablets (40 mEq total) by mouth daily.  . QUEtiapine (SEROQUEL)  25 MG tablet TAKE 1 TABLET AT BEDTIME MAY USE 1/2 TABLET DURING THE DAY AS NEEDED FOR AGITATION/HALLUCINATIONS (Patient taking differently: Take 12.5 mg by mouth at bedtime. )  . senna (SENOKOT) 8.6 MG TABS tablet Take 1 tablet (8.6 mg total) by mouth daily as needed for mild constipation.  . vitamin C (ASCORBIC ACID) 500 MG tablet Take 1,000 mg by mouth daily.   [name] warfarin (COUMADIN) 5 MG tablet Take 2.5-5 mg by mouth See admin instructions. Take one tablet (5 mg) by mouth on Monday, Wednesday, Friday at 6 pm, take 1/2 tablet (2.5 mg) on Sunday, Tuesday, Thursday, Saturday at [place]:  His family history includes Cervical cancer in his other; Dementia in his mother; Hypertension in his child and father; Lung cancer in his other; Stroke in his other. There is no history of Heart attack.  SOCIAL HISTORY: He  reports that he has never smoked. He has never used smokeless tobacco. He reports that he does not drink alcohol or use drugs.  REVIEW OF SYSTEMS:   Not obtainable  SUBJECTIVE:  As above  VITAL SIGNS: BP 108/70   Pulse 64   Temp (!) 97.3 F (36.3 C)   Resp 14   Ht 5\' 8"  (1.727 m)   Wt 61.7 kg   SpO2 99%   BMI 20.68 kg/m   HEMODYNAMICS:    VENTILATOR SETTINGS:    INTAKE / OUTPUT: I/O last 3 completed shifts: In: 937.1 [I.V.:261.4; IV Piggyback:675.8] Out: 1300 [Urine:1300]  PHYSICAL EXAMINATION: General: Lethargic elderly male in no overt distress Neuro: Very garbled verbal responses to questions.  He resists eye opening, pupils appear to be equal and EOMs full he does not have exaggerated nystagmus.  He does move both upper extremities, he is very slow to respond to instructions, when he does move he has a pronounced tremor in bilateral rigidity. HEENT:   Cardiovascular: S1 and S2 are regular without murmur rub or gallop Lungs: Abrasions are unlabored, there is symmetric air movement no wheezes Abdomen: Diminished flat and soft without any organomegaly  masses or tenderness   LABS:  BMET Recent Labs  Lab 05/05/18 1556 05/05/18 1945 05/06/18 0305  NA 138 139 140  K 6.5* 4.3 4.4  CL 103 108 105  CO2 27 23 23   BUN 10 10 12   CREATININE 0.94 0.82 0.79  GLUCOSE 107* 72 80    Electrolytes Recent Labs  Lab 05/05/18 1556 05/05/18 1945 05/06/18 0305  CALCIUM 9.3 9.4 9.5  MG  --  1.7 1.6*  PHOS  --   --  3.4    CBC Recent Labs  Lab 05/05/18 1556 05/06/18 0622  WBC 4.8 4.7  HGB 13.3 12.0*  HCT 42.9 37.7*  PLT 196 158    Coag's Recent Labs  Lab 05/05/18 1707 05/05/18 2133 05/06/18 0622  INR 2.22 1.23 1.41    Sepsis Markers Recent Labs  Lab 05/05/18 1603  LATICACIDVEN 1.24    ABG Recent Labs  Lab 05/05/18 2107  PHART 7.384  PCO2ART 37.4  PO2ART 70.0*    Liver Enzymes Recent Labs  Lab 05/05/18 1556  AST 60*  ALT 9  ALKPHOS 40  BILITOT 1.4*  ALBUMIN 3.6    Cardiac Enzymes No results for input(s): TROPONINI, PROBNP in the last  168 hours.  Glucose Recent Labs  Lab 05/05/18 1943 05/05/18 2327 05/06/18 0739  GLUCAP 73 88 84    Imaging Dg Chest 2 View  Result Date: 05/05/2018 CLINICAL DATA:  Shortness of Breath EXAM: CHEST - 2 VIEW COMPARISON:  November 12, 2017 FINDINGS: There is elevation of the left hemidiaphragm, stable. There is mild bibasilar atelectasis. There is no edema or consolidation. Heart size and pulmonary vascularity are normal. No adenopathy. There is a loop recorder on the left inferiorly and anteriorly. No evident bone lesions. IMPRESSION: Stable elevation of left hemidiaphragm. Mild bibasilar atelectasis. No edema or consolidation. Stable cardiac silhouette. Electronically Signed   By: Lowella Grip [name] M.D.   On: 05/05/2018 15:35   Ct Head Wo Contrast  Result Date: 05/06/2018 CLINICAL DATA:  Follow-up stroke. EXAM: CT HEAD WITHOUT CONTRAST TECHNIQUE: Contiguous axial images were obtained from the base of the skull through the vertex without intravenous contrast.  COMPARISON:  05/05/2018 FINDINGS: Brain: Mild diffuse cerebral atrophy. Ventricular dilatation consistent with central atrophy. Low-attenuation changes throughout the deep white matter consistent with small vessel ischemia. Old lacunar infarcts. 1.6 cm diameter focal hyperdensity again demonstrated in the right posterior fossa. This could represent a cerebellar hemorrhage or a calcified meningioma. No change since the previous study. No new hemorrhage or progression is identified. No mass effect or midline shift. Srihith Aquilino-white matter junctions are distinct. Basal cisterns are not effaced. Vascular: Moderate intracranial arterial vascular calcifications are present. Skull: Calvarium appears intact. Sinuses/Orbits: Paranasal sinuses and mastoid air cells are clear. Other: None. IMPRESSION: Stable appearance of focal hyperdensity in the right posterior fossa. This could represent a cerebellar hemorrhage or calcified meningioma. No progression. Electronically Signed   By: Lucienne Capers M.D.   On: 05/06/2018 00:51   Ct Head Wo Contrast  Result Date: 05/05/2018 CLINICAL DATA:  Altered mental status EXAM: CT HEAD WITHOUT CONTRAST TECHNIQUE: Contiguous axial images were obtained from the base of the skull through the vertex without intravenous contrast. COMPARISON:  Head CT 11/12/2017 FINDINGS: Brain: There is a 18 x 10 mm focus of acute hemorrhage in the right posterior fossa, likely intraparenchymal. Mild atrophy for age. There is no acute or chronic infarction. There is hypoattenuation of the periventricular white matter, most commonly indicating chronic ischemic microangiopathy. Vascular: Atherosclerotic calcification of the internal carotid arteries at the skull base. No abnormal hyperdensity of the major intracranial arteries or dural venous sinuses. Skull: The visualized skull base, calvarium and extracranial soft tissues are normal. Sinuses/Orbits: No fluid levels or advanced mucosal thickening of the visualized  paranasal sinuses. No mastoid or middle ear effusion. The orbits are normal. IMPRESSION: 1. Small focus of hemorrhage in the right posterior fossa, likely intraparenchymal within the cerebellum. No associated mass effect. 2. Severe chronic ischemic microangiopathy. Critical Value/emergent results were called by telephone at the time of interpretation on 05/05/2018 at 4:46 pm to Dr. Nanda Quinton , who verbally acknowledged these results. Electronically Signed   By: Ulyses Jarred M.D.   On: 05/05/2018 16:54     STUDIES:   DISCUSSION:     This is an 84 year old with Parkinson's disease, a history of pulmonary embolism x2 for which he normally takes Coumadin, and a history of hypokalemia for which he receives supplements at home who presented with a posterior fossa hemorrhage.  We are asked to see the patient because concurrently he was hyperkalemic  ASSESSMENT / PLAN:  PULMONARY A: No acute issues.  He does have a history of pulmonary embolism x2 and  as such should be on lifelong anticoagulation which is obviously held with the acute intracranial hemorrhage.  RENAL A: Hyperkalemia has resolved merrily with holding his supplementation.  He will need to have repeat labs to ensure that he does not overcorrect and again become hypokalemic  GASTROINTESTINAL A: Prophylaxis  is with Pepcid  HEMATOLOGIC A: 1 and was reversed with vitamin K and Kcentra  INFECTIOUS A: No issues  ENDOCRINE A: No issues     NEUROLOGIC A: Neurologically stable following posterior fossa hemorrhage  Neurologically he appears to be stable and his hyperkalemia has corrected, the critical care service will not routinely follow please reconsult [name] if needed   Lars Masson, MD [place] Pager: [phone]  05/06/2018, 8:47 AM

## 2018-05-06 NOTE — Progress Notes (Signed)
Pt transferred to 3w24. Report given to [place]. Pt assessment stable.

## 2018-05-06 NOTE — Progress Notes (Signed)
PT Cancellation Note  Patient Details Name: [name] [name] MRN: [id number] DOB: 1931-05-13   Cancelled Treatment:    Reason Eval/Treat Not Completed: Active bedrest order   [name], [place], DPT [phone]   05/06/2018, 7:36 AM

## 2018-05-07 DIAGNOSIS — I614 Nontraumatic intracerebral hemorrhage in cerebellum: Principal | ICD-10-CM

## 2018-05-07 LAB — BASIC METABOLIC PANEL
ANION GAP: 8 (ref 5–15)
BUN: 13 mg/dL (ref 8–23)
CO2: 26 mmol/L (ref 22–32)
CREATININE: 1.12 mg/dL (ref 0.61–1.24)
Calcium: 8.9 mg/dL (ref 8.9–10.3)
Chloride: 103 mmol/L (ref 98–111)
GFR calc non Af Amer: 58 mL/min — ABNORMAL LOW (ref >60)
Glucose, Bld: 121 mg/dL — ABNORMAL HIGH (ref 70–99)
Potassium: 3.7 mmol/L (ref 3.5–5.1)
Sodium: 137 mmol/L (ref 135–145)

## 2018-05-07 LAB — GLUCOSE, CAPILLARY
GLUCOSE-CAPILLARY: 150 mg/dL — AB (ref 70–99)
GLUCOSE-CAPILLARY: 69 mg/dL — AB (ref 70–99)
GLUCOSE-CAPILLARY: 122 mg/dL — AB (ref 70–99)
Glucose-Capillary: 125 mg/dL — ABNORMAL HIGH (ref 70–99)
Glucose-Capillary: 71 mg/dL (ref 70–99)
Glucose-Capillary: 77 mg/dL (ref 70–99)
Glucose-Capillary: 94 mg/dL (ref 70–99)
Glucose-Capillary: 96 mg/dL (ref 70–99)

## 2018-05-07 LAB — CBC
HCT: 38.2 % — ABNORMAL LOW (ref 39.0–52.0)
Hemoglobin: 12.1 g/dL — ABNORMAL LOW (ref 13.0–17.0)
MCH: 30.9 pg (ref 26.0–34.0)
MCHC: 31.7 g/dL (ref 30.0–36.0)
MCV: 97.7 fL (ref 78.0–100.0)
Platelets: 153 10*3/uL (ref 150–400)
RBC: 3.91 MIL/uL — ABNORMAL LOW (ref 4.22–5.81)
RDW: 14.4 % (ref 11.5–15.5)
WBC: 3.8 10*3/uL — ABNORMAL LOW (ref 4.0–10.5)

## 2018-05-07 LAB — AMMONIA: Ammonia: 20 umol/L (ref 9–35)

## 2018-05-07 LAB — PROTIME-INR
INR: 1.19
PROTHROMBIN TIME: 15 s (ref 11.4–15.2)

## 2018-05-07 MED ORDER — DEXTROSE-NACL 5-0.45 % IV SOLN
INTRAVENOUS | Status: DC
  Administered 2018-05-07: 12:00:00 via INTRAVENOUS

## 2018-05-07 MED ORDER — DEXTROSE 50 % IV SOLN
INTRAVENOUS | Status: AC
  Administered 2018-05-07: 50 mL via INTRAVENOUS
  Filled 2018-05-07: qty 50

## 2018-05-07 MED ORDER — DEXTROSE 50 % IV SOLN
1.0000 | Freq: Once | INTRAVENOUS | Status: AC
  Administered 2018-05-07: 50 mL via INTRAVENOUS

## 2018-05-07 NOTE — Progress Notes (Signed)
OT Cancellation Note  Patient Details Name: Kanin Lia MRN: [id number] DOB: 05-May-1931   Cancelled Treatment:    Reason Eval/Treat Not Completed: Active bedrest order.  PA paged.  Will reattempt once activity level increased.  Kinza Gouveia [place], OTR/L [phone]   Lucille Passy M 05/07/2018, 1:42 PM

## 2018-05-07 NOTE — Progress Notes (Signed)
Brief note: was paged by overnight RN LIZ: " FSBS 71, not responding, gave D50". I visualized the patient. BG on re check was 150. Patient still not responding as normal. Patient received Seroquel about midnight last night. Patient is very slow to respond, but would squeeze his eyes shut when I would attempt to get an exam. Pulses palpable. S1, S2 heard. Lungs CTA bilaterally. No adventitious breat sounds noted.  Dr. Rory Percy saw patient as well. Likely, drowsy d/t late dose of seroquel.  The daughter at bedside was updated. Who was agreeable to plan.  Nurses instructed to re-page with further concerns.

## 2018-05-07 NOTE — Progress Notes (Signed)
STROKE TEAM PROGRESS NOTE   HISTORY OF PRESENT ILLNESS (per record) [name] [name] is an 84 y.o. male  With PMH of CAD, HTN, HLD, dementia, PE (on warfarin), advanced parkinson's disease, CHF with LVEF of 40 to 45% documented in 2016, presented to [facility] ED for bradycardia. Neurology consulted for hemorrhage found on CT head.  Patient was at home when he had an acute onset of diaphoresis and decreased alertness. Per family patient is very slow to respond at baseline but was not complaining of pain. Denies fever, any recent illness or any recent falls. His home health aid states that he did fall about 1 month ago, but she protected his head really well with her hands. Does not believe that he hit his head other than against her hands. He has had similar sweating with bradycardic events and hypotension in the past. Per family patient has a loop recorder but is not a good candidate for a pacemaker. Patient currently taking coumadin.  Ed course: Potassium: 6.5, BG: 107, AST: 60 , total bili: 1.4 CT head:  Small focus of hemorrhage in the right posterior fossa, likely intraparenchymal within the cerebellum. No associated mass effect. Severe chronic ischemic microangiopathy.   SUBJECTIVE (INTERVAL HISTORY) Poorly responsive today. His daughter [name] [name] it is the result of having received Seroquel later than usual (about midnight instead of 6 PM). Blood glucose also noted to be low per RN. Pt received one amp D50 followed by another several hours later. Started on D5 1/2 NS @ 50 ccs per hr. Consider repeat Head CT if no improvement.    OBJECTIVE Vitals:   05/06/18 2351 05/07/18 0407 05/07/18 0741 05/07/18 1210  BP: (!) 145/96 116/78 129/87 137/81  Pulse: 67 (!) 56 (!) 58 62  Resp: 18  18 17   Temp: 97.7 F (36.5 C) 97.7 F (36.5 C) 97.7 F (36.5 C) 98 F (36.7 C)  TempSrc: Oral Oral Axillary Axillary  SpO2: 98% 97% 97% 98%  Weight:      Height:        CBC:  Recent Labs  Lab  05/05/18 1556 05/06/18 0622 05/07/18 0822  WBC 4.8 4.7 3.8*  NEUTROABS 3.5  --   --   HGB 13.3 12.0* 12.1*  HCT 42.9 37.7* 38.2*  MCV 100.2* 96.9 97.7  PLT 196 158 720    Basic Metabolic Panel:  Recent Labs  Lab 05/05/18 1945 05/06/18 0305 05/07/18 0822  NA 139 140 137  K 4.3 4.4 3.7  CL 108 105 103  CO2 23 23 26   GLUCOSE 72 80 121*  BUN 10 12 13   CREATININE 0.82 0.79 1.12  CALCIUM 9.4 9.5 8.9  MG 1.7 1.6*  --   PHOS  --  3.4  --     Lipid Panel:     Component Value Date/Time   CHOL 242 (H) 05/05/2018 1556   CHOL 183 07/08/2016 1035   TRIG 157 (H) 05/05/2018 1556   HDL 63 05/05/2018 1556   HDL 82 07/08/2016 1035   CHOLHDL 3.8 05/05/2018 1556   VLDL 31 05/05/2018 1556   LDLCALC 148 (H) 05/05/2018 1556   LDLCALC 82 07/08/2016 1035   HgbA1c:  Lab Results  Component Value Date   HGBA1C 5.9 (H) 05/05/2018   Urine Drug Screen:     Component Value Date/Time   LABOPIA NONE DETECTED 08/11/2011 2331   COCAINSCRNUR NONE DETECTED 08/11/2011 2331   LABBENZ NONE DETECTED 08/11/2011 2331   AMPHETMU NONE DETECTED 08/11/2011 2331   THCU NONE  DETECTED 08/11/2011 2331   LABBARB NONE DETECTED 08/11/2011 2331    Alcohol Level     Component Value Date/Time   ETH <10 11/12/2017 1656    IMAGING   Dg Chest 2 View 05/05/2018 IMPRESSION:  Stable elevation of left hemidiaphragm. Mild bibasilar atelectasis. No edema or consolidation. Stable cardiac silhouette.  Ct Head Wo Contrast 05/06/2018 IMPRESSION:  Stable appearance of focal hyperdensity in the right posterior fossa. This could represent a cerebellar hemorrhage or calcified meningioma. No progression.   Ct Head Wo Contrast 05/05/2018 IMPRESSION:  1. Small focus of hemorrhage in the right posterior fossa, likely intraparenchymal within the cerebellum. No associated mass effect.  2. Severe chronic ischemic microangiopathy.    MRI Brain Wo Contrast 05/06/2018 IMPRESSION: 1. Subacute hematoma right cerebellum  with methemoglobin formation and surrounding edema. 2. Advanced atrophy and advanced chronic microvascular ischemia 3. Enlarged pituitary 13 mm consistent with adenoma.    PHYSICAL EXAM Blood pressure 137/81, pulse 62, temperature 98 F (36.7 C), temperature source Axillary, resp. rate 17, height 5\' 8"  (1.727 m), weight 61.7 kg, SpO2 98 %.       ASSESSMENT/PLAN [name] is an 84 y.o. male with history of CAD, HTN, HLD, dementia, PE (on warfarin), advanced parkinson's disease, CHF with LVEF of 40 to 45% documented in 2016 presenting with bradycardia and found to have hemorrhage on Head CT.  He did not receive IV t-PA due to [place].  Cerebellum ICH - non traumatic.  Resultant -   CT head - Small focus of hemorrhage in the right posterior fossa, likely intraparenchymal within the cerebellum.  MRI head - Subacute hematoma right cerebellum with methemoglobin formation and surrounding edema.  MRA head - not performed  Carotid Doppler - not indicated  2D Echo - not indicated  LDL - 148  HgbA1c - 5.9  VTE prophylaxis - SCDs  Diet - Dysphagia 3 thin liquids  warfarin daily also on ASA 81 mg daily prior to admission, now on No antithrombotic  Patient counseled to be compliant with his antithrombotic medications  Ongoing aggressive stroke risk factor management  Therapy recommendations:  pending  Disposition:  Pending  Hypertension  BP mildly low - Systolic~ 086 . Permissive hypertension (OK if < 220/120) but gradually normalize in 5-7 days . Long-term BP goal normotensive  Hyperlipidemia  Lipid lowering medication PTA:  none  LDL 148, goal < 70  Current lipid lowering medication: none - consider adding statin at discharge.  Other Stroke Risk Factors  Advanced age  Coronary artery disease  Warfarin  Other Active Problems  Hx of PE  Parkinson's disease   PLAN  Hold anticoagulation for now. X 14 days  SBP < 140  Hyperkalemia resolved -  (was on KCL 40 meq daily PTA)  4.7 -> 3.8  Bradycardia most likely related to PD, asymptomatic from it, continue to monitor  Repeat CTH if pt remains lethargic. (nurse reports pt seems to be improving)   Exam more drowsy this morning due to getting seroquel later then usual. Daughter at bedside not worried as she has seen this happen at home before when he gets his medication later in the evening then usual Wakes up with deep sternal rub and still oriented to self PERRL B/l UE tremors 4/5 strength B/L LE 3/5  If does not wake up would consider another [facility] day # 2    To contact Stroke Continuity provider, please refer to [email or web address] After hours, contact General Neurology

## 2018-05-07 NOTE — Progress Notes (Signed)
PT Cancellation Note  Patient Details Name: [name] [name] MRN: [id number] DOB: 1931-05-27   Cancelled Treatment:    Reason Eval/Treat Not Completed: Active bedrest order PA paged.  Will reattempt once activity level increased.  Ellamae Sia, PT, DPT Acute Rehabilitation Services  Pager: [place] 05/07/2018, 3:55 PM

## 2018-05-08 DIAGNOSIS — I69391 Dysphagia following cerebral infarction: Secondary | ICD-10-CM

## 2018-05-08 DIAGNOSIS — R1310 Dysphagia, unspecified: Secondary | ICD-10-CM

## 2018-05-08 DIAGNOSIS — I610 Nontraumatic intracerebral hemorrhage in hemisphere, subcortical: Secondary | ICD-10-CM

## 2018-05-08 DIAGNOSIS — R001 Bradycardia, unspecified: Secondary | ICD-10-CM

## 2018-05-08 DIAGNOSIS — D638 Anemia in other chronic diseases classified elsewhere: Secondary | ICD-10-CM

## 2018-05-08 DIAGNOSIS — Z86711 Personal history of pulmonary embolism: Secondary | ICD-10-CM

## 2018-05-08 DIAGNOSIS — G20 Parkinson's disease: Secondary | ICD-10-CM

## 2018-05-08 DIAGNOSIS — I10 Essential (primary) hypertension: Secondary | ICD-10-CM

## 2018-05-08 LAB — GLUCOSE, CAPILLARY
GLUCOSE-CAPILLARY: 81 mg/dL (ref 70–99)
GLUCOSE-CAPILLARY: 105 mg/dL — AB (ref 70–99)
Glucose-Capillary: 81 mg/dL (ref 70–99)
Glucose-Capillary: 88 mg/dL (ref 70–99)
Glucose-Capillary: 101 mg/dL — ABNORMAL HIGH (ref 70–99)
Glucose-Capillary: 110 mg/dL — ABNORMAL HIGH (ref 70–99)

## 2018-05-08 LAB — PROTIME-INR
INR: 1.13
PROTHROMBIN TIME: 14.4 s (ref 11.4–15.2)

## 2018-05-08 NOTE — Progress Notes (Signed)
Skin assessment done by this nurse and Delia Chimes. On head to toe assessment , no pressure ulcer noted

## 2018-05-08 NOTE — Consult Note (Signed)
Physical Medicine and Rehabilitation Consult Reason for Consult: Decreased functional mobility Referring Physician: Dr. Leonie Man   HPI: [name] [name] is a 84 y.o. right-handed male with history of hypertension, dementia, CAD, bradycardia with loop recorder placed 2016, pulmonary emboli maintained on chronic Coumadin, advanced Parkinson's disease maintained on Sinemet, diastolic congestive heart failure with latest documented ejection fraction of 45%.  Per chart review and aid, patient lives with daughter.  Two-level home with bedroom on [address].  Patient does have a personal care attendant as needed.  Reportedly patient was ambulatory with bilateral handheld assistance.  Presented 05/05/2018 with acute onset of diaphoresis and decreased level of alertness.  There was a report of a fall 1 month ago by home health aide.  Cranial CT scan reviewed, suggesting right cerebellar hemorrhage. Per report, small focus of hemorrhage in the right posterior fossa likely intraparenchymal within the cerebellum.  No associated mass-effect.  Noted potassium of 6.5.  INR upon admission of 2.22.  Anticoagulation held.  Follow-up MRI again showing subacute hematoma right cerebellum and surrounding edema.  Tolerating a mechanical soft diet.  Therapy evaluations completed 05/08/2018 with recommendations of physical medicine rehab consult.   Review of Systems  Unable to perform ROS: Dementia   Past Medical History:  Diagnosis Date  . Anxiety   . Coronary artery disease, non-occlusive    with history of MI; Cath 2008 w multivessel nonobstructive CAD  . Dementia   . Depression   . GERD (gastroesophageal reflux disease)   . Hyperlipidemia   . Hypertension   . Memory loss   . Parkinson disease ([place])   . PE (pulmonary embolism)    unprovoked PE completed 6 months of warfarin, warfarin d/ced 02/12/2010, repeat PE 07/10/2010 after a long car ride and now on lifelong coumadin.  . Pituitary microadenoma ([place])    incidental finding CT 12/2009  . Prostate cancer ([place])   . Scoliosis   . Sigmoid volvulus ([place]) 08/02/2016  . Volvulus of colon ([place]) 06/01/2017   Past Surgical History:  Procedure Laterality Date  . CATARACT EXTRACTION    . COLONOSCOPY N/A 05/30/2017   Procedure: COLONOSCOPY;  Surgeon: Ronald Lobo, MD;  Location: [facility] ENDOSCOPY;  Service: Endoscopy;  Laterality: N/A;  . FLEXIBLE SIGMOIDOSCOPY N/A 08/02/2016   Procedure: FLEXIBLE SIGMOIDOSCOPY;  Surgeon: Ronald Lobo, MD;  Location: [facility] ENDOSCOPY;  Service: Endoscopy;  Laterality: N/A;  . FLEXIBLE SIGMOIDOSCOPY N/A 08/06/2016   Procedure: FLEXIBLE SIGMOIDOSCOPY;  Surgeon: Ronald Lobo, MD;  Location: [facility] ENDOSCOPY;  Service: Endoscopy;  Laterality: N/A;  . FLEXIBLE SIGMOIDOSCOPY Left 06/02/2017   Procedure: FLEXIBLE SIGMOIDOSCOPY;  Surgeon: Arta Silence, MD;  Location: [facility] ENDOSCOPY;  Service: Endoscopy;  Laterality: Left;  . FLEXIBLE SIGMOIDOSCOPY N/A 06/07/2017   Procedure: FLEXIBLE SIGMOIDOSCOPY for possible decompression;  Surgeon: Otis Brace, MD;  Location: [place];  Service: Gastroenterology;  Laterality: N/A;  . LEFT HEART CATHETERIZATION WITH CORONARY ANGIOGRAM N/A 12/10/2014   Procedure: LEFT HEART CATHETERIZATION WITH CORONARY ANGIOGRAM;  Surgeon: [name], MD;  Location: [facility] CATH LAB;  Service: Cardiovascular;  Laterality: N/A;  . LOOP RECORDER IMPLANT N/A 12/16/2014   Procedure: LOOP RECORDER IMPLANT;  Surgeon: [name], MD;  Location: [facility] CATH LAB;  Service: Cardiovascular;  Laterality: N/A;  . PARTIAL COLECTOMY N/A 06/08/2017   Procedure: SIGMOID  COLECTOMY;  Surgeon: Georganna Skeans, MD;  Location: [place];  Service: General;  Laterality: N/A;  . PROSTATECTOMY     Family History  Problem Relation Age of Onset  .  Hypertension Father        Passed away from cerebral hemorrhage at age of 41.  [name] Dementia Mother   . Hypertension Child        4 adult children  . Cervical cancer Other   . Lung cancer  Other   . Stroke Other   . Heart attack Neg Hx    Social History:  reports that he has never smoked. He has never used smokeless tobacco. He reports that he does not drink alcohol or use drugs. Allergies:  Allergies  Allergen Reactions  . Aricept [Donepezil Hcl] Other (See Comments)    Symptomatic Bradycardia.  . Shrimp [Shellfish Allergy] Other (See Comments)    Causes gout   Medications Prior to Admission  Medication Sig Dispense Refill  . acetaminophen (TYLENOL) 500 MG tablet Take 1,000 mg by mouth 2 (two) times daily.     [name] amLODipine (NORVASC) 2.5 MG tablet Take 1 tablet (2.5 mg total) by mouth daily. (Patient taking differently: Take 2.5 mg by mouth daily as needed (BP >120/100). ) 90 tablet 1  . aspirin 81 MG chewable tablet Chew 81 mg by mouth daily as needed (chest pain).    [name] b complex vitamins tablet Take 1 tablet by mouth daily.     . Ca Phosphate-Cholecalciferol (CALCIUM/VITAMIN D3 GUMMIES) 250-350 MG-UNIT CHEW Chew 1 Dose by mouth 2 (two) times daily. (Patient taking differently: Chew 1 tablet by mouth 2 (two) times daily. ) 180 tablet 3  . carbidopa-levodopa (SINEMET IR) 25-100 MG tablet Take 1 tablet by mouth 4 (four) times daily. 120 tablet 2  . fish oil-omega-3 fatty acids 1000 MG capsule Take 2 g by mouth daily.     [name] loratadine (CLARITIN) 10 MG tablet Take 10 mg by mouth daily.    . memantine (NAMENDA XR) 28 MG CP24 24 hr capsule TAKE 1 CAPSULE BY MOUTH EVERY DAY IN THE MORNING (Patient taking differently: Take 28 mg by mouth daily. ) 30 capsule 11  . Multiple Vitamins-Minerals (MULTIVITAMIN WITH MINERALS) tablet Take 1 tablet by mouth daily.     . polyethylene glycol (MIRALAX / GLYCOLAX) packet Take 17 g by mouth daily as needed (constipation).    . potassium chloride SA (KLOR-CON M20) 20 MEQ tablet Take 2 tablets (40 mEq total) by mouth daily. 180 tablet 1  . QUEtiapine (SEROQUEL) 25 MG tablet TAKE 1 TABLET AT BEDTIME MAY USE 1/2 TABLET DURING THE DAY AS NEEDED FOR  AGITATION/HALLUCINATIONS (Patient taking differently: Take 12.5 mg by mouth at bedtime. ) 90 tablet 1  . senna (SENOKOT) 8.6 MG TABS tablet Take 1 tablet (8.6 mg total) by mouth daily as needed for mild constipation. 120 each 0  . vitamin C (ASCORBIC ACID) 500 MG tablet Take 1,000 mg by mouth daily.     [name] warfarin (COUMADIN) 5 MG tablet Take 2.5-5 mg by mouth See admin instructions. Take one tablet (5 mg) by mouth on Monday, Wednesday, Friday at 6 pm, take 1/2 tablet (2.5 mg) on Sunday, Tuesday, Thursday, Saturday at 6pm      Home: [place] expects to be discharged to:: Private residence Living Arrangements: Children Available Help at Discharge: Family, Available 24 hours/day, Personal care attendant Type of Home: House Home Access: Stairs to enter [employer] of Steps: 4 Entrance Stairs-Rails: Right, Left Home Layout: Able to live on main level with bedroom/bathroom, Two level Bathroom Toilet: [place]: Environmental consultant - 2 wheels, Bedside commode, Wheelchair - manual, Shower seat Additional Comments: doesn't  use walker just B HHA for gait  Functional History: Prior Function Level of Independence: Needs assistance Gait / Transfers Assistance Needed: wc or 2 HHA to walk as pt will release walker per family ADL's / Homemaking Assistance Needed: self feeds with min assist, total assist otherwise in ADL Communication / Swallowing Assistance Needed: minimally conversant Comments: Daughter works but has aides come in so pt is never alone. Functional Status:  Mobility: Bed Mobility Overal bed mobility: Needs Assistance Bed Mobility: Supine to Sit Supine to sit: Mod assist General bed mobility comments: pt in chair Transfers Overall transfer level: Needs assistance Equipment used: 1 person hand held assist Transfers: Sit to/from Stand, Stand Pivot Transfers Sit to Stand: Max assist, +2 safety/equipment Stand pivot transfers: Max assist, +2  safety/equipment General transfer comment: pt resistant to movement Ambulation/Gait General Gait Details: unable(PLOF was 2 HHA due to pt releasing walker with hands)    ADL: ADL General ADL Comments: currently needing total assist  Cognition: Cognition Overall Cognitive Status: History of cognitive impairments - at baseline Orientation Level: Other (comment)(UTA) Cognition Arousal/Alertness: Awake/alert Behavior During Therapy: Flat affect Overall Cognitive Status: History of cognitive impairments - at baseline General Comments: pt picking at items in environment randomly  Blood pressure (!) 148/81, pulse 68, temperature 97.8 F (36.6 C), temperature source Oral, resp. rate 18, height 5\' 8"  (1.727 m), weight 61.7 kg, SpO2 100 %. Physical Exam  Vitals reviewed. Constitutional: He appears well-developed.  Frail  HENT:  Head: Normocephalic and atraumatic.  Eyes: Right eye exhibits no discharge. Left eye exhibits no discharge.  Not participating in extraocular muscle exam  Neck: Normal range of motion. Neck supple.  Cardiovascular: Normal rate and regular rhythm.  Respiratory: Effort normal and breath sounds normal.  GI: Soft. Bowel sounds are normal.  Musculoskeletal:  No edema or tenderness in extremities  Neurological: He is alert.  Patient sitting up in chair.   Essentially nonverbal he would order a few words.   Very slow delay in processing and limited to follow commands.   Exam overall limited due to patient's level of participation, moving all extremities R>L  Skin: Skin is warm and dry.  Psychiatric: His affect is blunt. He is slowed and withdrawn. Cognition and memory are impaired. He expresses inappropriate judgment. He is noncommunicative.    Results for orders placed or performed during the hospital encounter of 05/05/18 (from the past 24 hour(s))  Glucose, capillary     Status: Abnormal   Collection Time: 05/07/18  4:08 PM  Result Value Ref Range    Glucose-Capillary 122 (H) 70 - 99 mg/dL   Comment 1 Notify RN    Comment 2 Document in Chart   Glucose, capillary     Status: None   Collection Time: 05/07/18 10:31 PM  Result Value Ref Range   Glucose-Capillary 94 70 - 99 mg/dL   Comment 1 Notify RN    Comment 2 Document in Chart   Protime-INR     Status: None   Collection Time: 05/08/18  5:03 AM  Result Value Ref Range   Prothrombin Time 14.4 11.4 - 15.2 seconds   INR 1.13   Glucose, capillary     Status: None   Collection Time: 05/08/18  6:46 AM  Result Value Ref Range   Glucose-Capillary 81 70 - 99 mg/dL   Comment 1 Notify RN    Comment 2 Document in Chart   Glucose, capillary     Status: None   Collection Time: 05/08/18  7:25 AM  Result Value Ref Range   Glucose-Capillary 88 70 - 99 mg/dL   Comment 1 Notify RN    Comment 2 Document in Chart   Glucose, capillary     Status: Abnormal   Collection Time: 05/08/18 11:36 AM  Result Value Ref Range   Glucose-Capillary 101 (H) 70 - 99 mg/dL   Comment 1 Notify RN    Comment 2 Document in Chart    *Note: Due to a large number of results and/or encounters for the requested time period, some results have not been displayed. A complete set of results can be found in Results Review.   No results found.  Assessment/Plan: Diagnosis: Hematoma right cerebellum  Labs and images (see above) independently reviewed.  Records reviewed and summated above. Stroke: Continue secondary stroke prophylaxis and Risk Factor Modification listed below:   Blood Pressure Management:  Continue current medication with prn's with permisive HTN per primary team Prediabetes management:   ?left sided hemiparesis: fit for orthosis to prevent contractures if necessary Motor recovery: Fluoxetine  1. Does the need for close, 24 hr/day medical supervision in concert with the patient's rehab needs make it unreasonable for this patient to be served in a less intensive setting? Yes  2. Co-Morbidities requiring  supervision/potential complications: mechanical soft diet (advance diet as tolerated), hyperkalemia (monitor, treat if necessary), HTN (monitor and provide prns in accordance with increased physical exertion and pain), dementia, CAD (cont meds), bradycardia (s/p loop recorder placed 2016), pulmonary emboli (resume Coumadin when appropriate), advanced Parkinson's disease (cont meds), diastolic congestive heart failure (monitor for signs/symptoms of fluid overload), anemia of chronic disease (transfuse if necessary to ensure appropriate perfusion for increased activity tolerance) 3. Due to safety, disease management, medication administration and patient education, does the patient require 24 hr/day rehab nursing? Yes 4. Does the patient require coordinated care of a physician, rehab nurse, PT (1-2 hrs/day, 5 days/week), OT (1-2 hrs/day, 5 days/week) and SLP (1-2 hrs/day, 5 days/week) to address physical and functional deficits in the context of the above medical diagnosis(es)? Yes Addressing deficits in the following areas: balance, endurance, locomotion, strength, transferring, bowel/bladder control, bathing, dressing, feeding, grooming, toileting, cognition, speech, language, swallowing and psychosocial support 5. Can the patient actively participate in an intensive therapy program of at least 3 hrs of therapy per day at least 5 days per week? Potentially 6. The potential for patient to make measurable gains while on inpatient rehab is good and fair 7. Anticipated functional outcomes upon discharge from inpatient rehab are mod assist  with PT, mod assist with OT, max assist with SLP. 8. Estimated rehab length of stay to reach the above functional goals is: 7-10 days. 9. Anticipated D/C setting: Home 10. Anticipated post D/C treatments: HH therapy and Home excercise program 11. Overall Rehab/Functional Prognosis: fair  RECOMMENDATIONS: This patient's condition is appropriate for continued rehabilitative  care in the following setting: Consider CIR if caregiver education necessary as patient will have limited carry if able to tolerate 3 hours of therapy/day. Would also consider palliative care consult.   Patient has agreed to participate in recommended program. Potentially Note that insurance prior authorization may be required for reimbursement for recommended care.  Comment: Rehab Admissions Coordinator to follow up.   I have personally performed a face to face diagnostic evaluation, including, but not limited to relevant history and physical exam findings, of this patient and developed relevant assessment and plan.  Additionally, I have reviewed and concur with the physician assistant's documentation  above.   Delice Lesch, MD, ABPMR Lavon Paganini Angiulli, PA-C 05/08/2018

## 2018-05-08 NOTE — Progress Notes (Signed)
Inpatient Rehabilitation-Admissions Coordinator    Met with patient and his daughter and brother at the bedside to discuss team's recommendation for inpatient rehabilitation. Shared booklets, expectations while in CIR, expected length of stay, and anticipated functional level at DC.   Family aware pt will need 24/7 A at home and is already planning for assistance needed. Family wanting to pursue CIR at this time so they can return pt home with assistance. AC has began Ship broker process. Please call if questions.   Jhonnie Garner, OTR/L  Rehab Admissions Coordinator  [phone] 05/08/2018 4:50 PM

## 2018-05-08 NOTE — Progress Notes (Addendum)
  Speech Language Pathology Treatment: Dysphagia  Patient Details Name: [name] [name] MRN: [id number] DOB: 1931/05/21 Today's Date: 05/08/2018 Time: 7262-0355 SLP Time Calculation (min) (ACUTE ONLY): 16 min  Assessment / Plan / Recommendation Clinical Impression  Skilled treatment session focused on dysphagia goals. Pt's daughter and paid caregiver (CNA) was present. They provide that pt will hold food in his mouth if he eats/is fed prior to lunch time. Pt doesn't hold food and consumes "soft food" functionally after lunch time. This session pt consumed small bites of dysphagia 3 breakfast tray with no holding and no overt s/s of aspiration with thin liquids. Pt's daughter and caregiver state that pt has started consuming softer foods at home such as casseroles or very soft chicken with gravy on it. They state that pt is at baseline with oropharyngeal abilities and they are very capable of providing appropriate food items as well and aware of s/s of aspiration and pneumonia. ST to sign off at this time.    HPI HPI: 84 year old male with h/o dementia, GERD, Parkinson's disease, admitted with syncope.  Initial evaluation revealed small posterior cerebellar hemorrhage.       SLP Plan  All goals met       Recommendations  Diet recommendations: Dysphagia 3 (mechanical soft);Thin liquid Liquids provided via: Straw;Cup Medication Administration: Crushed with puree Supervision: Staff to assist with self feeding;Full supervision/cueing for compensatory strategies Compensations: Slow rate;Small sips/bites Postural Changes and/or Swallow Maneuvers: Seated upright 90 degrees                Oral Care Recommendations: Oral care BID Follow up Recommendations: None SLP Visit Diagnosis: Dysphagia, oropharyngeal phase (R13.12) Plan: All goals met       GO                [name] [name] 05/08/2018, 12:15 PM

## 2018-05-08 NOTE — Consult Note (Signed)
   [facility] CM Inpatient Consult   05/08/2018  [name] [name] [birth year] [id number]    Patient screened for potential [facility] Care Management program servies due to unplanned readmission risk score of 23% (high).  Spoke with inpatient RNCM. CIR is recommended. Will continue to follow along for disposition plans and potential [facility] Care Management needs.   Marthenia Rolling, MSN-Ed, RN,BSN [facility] Liaison [phone]

## 2018-05-08 NOTE — Care Management Important Message (Signed)
Important Message  Patient Details  Name: [name] [name] MRN: [id number] Date of Birth: 1931/07/11   Medicare Important Message Given:  Yes    [name] Montine Circle 05/08/2018, 3:43 PM

## 2018-05-08 NOTE — Progress Notes (Addendum)
STROKE TEAM PROGRESS NOTE   SUBJECTIVE (INTERVAL HISTORY) His CNA is at the bedside. His dtr left questions that Dr. Leonie Man addresses. She was not available via phone. Stable overnight per CNA. CIR recommended. Will get consult.  OBJECTIVE Vitals:   05/08/18 0322 05/08/18 0750 05/08/18 1136 05/08/18 1647  BP: 118/82 (!) 136/97 (!) 148/81 (!) 138/100  Pulse: (!) 59 64 68 68  Resp: 18 18 18 18   Temp: 98.3 F (36.8 C) 98 F (36.7 C) 97.8 F (36.6 C) 97.9 F (36.6 C)  TempSrc:  Oral Oral Oral  SpO2: 100% 97% 100% 98%  Weight:      Height:        CBC:  Recent Labs  Lab 05/05/18 1556 05/06/18 0622 05/07/18 0822  WBC 4.8 4.7 3.8*  NEUTROABS 3.5  --   --   HGB 13.3 12.0* 12.1*  HCT 42.9 37.7* 38.2*  MCV 100.2* 96.9 97.7  PLT 196 158 782    Basic Metabolic Panel:  Recent Labs  Lab 05/05/18 1945 05/06/18 0305 05/07/18 0822  NA 139 140 137  K 4.3 4.4 3.7  CL 108 105 103  CO2 23 23 26   GLUCOSE 72 80 121*  BUN 10 12 13   CREATININE 0.82 0.79 1.12  CALCIUM 9.4 9.5 8.9  MG 1.7 1.6*  --   PHOS  --  3.4  --     Lipid Panel:     Component Value Date/Time   CHOL 242 (H) 05/05/2018 1556   CHOL 183 07/08/2016 1035   TRIG 157 (H) 05/05/2018 1556   HDL 63 05/05/2018 1556   HDL 82 07/08/2016 1035   CHOLHDL 3.8 05/05/2018 1556   VLDL 31 05/05/2018 1556   LDLCALC 148 (H) 05/05/2018 1556   LDLCALC 82 07/08/2016 1035   HgbA1c:  Lab Results  Component Value Date   HGBA1C 5.9 (H) 05/05/2018   Urine Drug Screen:     Component Value Date/Time   LABOPIA NONE DETECTED 08/11/2011 2331   COCAINSCRNUR NONE DETECTED 08/11/2011 2331   LABBENZ NONE DETECTED 08/11/2011 2331   AMPHETMU NONE DETECTED 08/11/2011 2331   THCU NONE DETECTED 08/11/2011 2331   LABBARB NONE DETECTED 08/11/2011 2331    Alcohol Level     Component Value Date/Time   ETH <10 11/12/2017 1656    IMAGING Ct Head Wo Contrast 05/05/2018 1. Small focus of hemorrhage in the right posterior fossa, likely  intraparenchymal within the cerebellum. No associated mass effect.  2. Severe chronic ischemic microangiopathy.   MRI Brain Wo Contrast 05/06/2018 1. Subacute hematoma right cerebellum with methemoglobin formation and surrounding edema. 2. Advanced atrophy and advanced chronic microvascular ischemia 3. Enlarged pituitary 13 mm consistent with adenoma.  Ct Head Wo Contrast 05/06/2018 Stable appearance of focal hyperdensity in the right posterior fossa. This could represent a cerebellar hemorrhage or calcified meningioma. No progression.   Dg Chest 2 View 05/05/2018 Stable elevation of left hemidiaphragm. Mild bibasilar atelectasis. No edema or consolidation. Stable cardiac silhouette.   PHYSICAL EXAM  Frail elderly african [place] male not in distress. . Afebrile. Head is nontraumatic. Neck is supple without bruit.    Cardiac exam no murmur or gallop. Lungs are clear to auscultation. Distal pulses are well felt. Neurological Exam Awake alert disoriented.  Slow to respond to questions.  Diminished attention, registration and recall.  Speaks in a hypophonic voice.  Follows simple midline and one-step commands.  Extraocular movements are full range without nystagmus.  Blinks to threat bilaterally.  Face is  symmetric.  Diminished facial expression.  Positive glabellar tap.  Motor system exam reveals symmetric upper extremity strength but variable and poor effort.  Mild proximal lower extremity weakness but again effort is poor and variable.  No focal weakness.  Mild action tremor of outstretched upper extremities.  Cogwheel rigidity present bilaterally.  Gait not tested. ASSESSMENT/PLAN [name] [name] is an 84 y.o. male with history of CAD, HTN, HLD, dementia, PE (on warfarin), advanced parkinson's disease, CHF with LVEF of 40 to 45% documented in 2016 presenting with bradycardia and found to have hemorrhage on Head CT.  He did not receive IV t-PA due to [place].  Stroke:  Small R Cerebellum ICH  - non traumatic warfarin associated coagulopathy  CT head - Small focus of hemorrhage in the right posterior fossa, likely intraparenchymal within the cerebellum.  MRI head - Subacute hematoma right cerebellum with methemoglobin formation and surrounding edema.  MRA head - not performed  Carotid Doppler - not indicated  2D Echo - not indicated  Loop recorder - now deactivated. Never showed AF. Pt opted to leave in.  LDL - 148  HgbA1c - 5.9  VTE prophylaxis - SCDs  Diet - Dysphagia 3 thin liquids  warfarin daily also on ASA 81 mg daily prior to admission, now on No antithrombotic due to hmg  Ongoing aggressive stroke risk factor management  Therapy recommendations:  CIR. Consult placed  Disposition:  Pending  PE   on warfarin at time of admission  Reversed in the ED  Hypertension  BP high as 157/127 on arrival . BP goal < 160  . BP goal normotensive  Hyperlipidemia  Lipid lowering medication PTA:  none  LDL 148, goal < 70  Current lipid lowering medication: none   consider adding statin at discharge.  Other Stroke Risk Factors  Advanced age  Coronary artery disease  Other Active Problems  Parkinson's disease  Hyperkalemia resolved - (was on KCL 40 meq daily PTA)  4.7 -> 3.8  Bradycardia most likely related to PD, asymptomatic from it, continue to monitor  Sedated following late seroquel dose over the weekend, resolved  Hospital day # [place], MSN, APRN, ANVP-BC, AGPCNP-BC Advanced Practice Stroke Nurse [place] for Schedule & Pager information 05/08/2018 6:47 PM  I have personally examined this patient, reviewed notes, independently viewed imaging studies, participated in medical decision making and plan of care.ROS completed by me personally and pertinent positives fully documented  I have made any additions or clarifications directly to the above note. Agree with note above.  Recommend hold anticoagulation  for at least 2 weeks and then repeat CT scan and resume warfarin without aspirin.  No family available at the bedside for discussion.  Discussed with patient CNA and answered questions about risk plan of care.  Change Seroquel to as needed only due to excessive sedation.  Greater than 50% time during this 25-minute visit was spent on counseling and coordination of care about his hemorrhagic stroke and parkinsonism and answering questions  [name], MD Medical Director [place] Pager: [phone] 05/08/2018 8:01 PM  To contact Stroke Continuity provider, please refer to [email or web address] After hours, contact General Neurology

## 2018-05-08 NOTE — Care Management Note (Signed)
Case Management Note  Patient Details  Name: [name] [name] MRN: [id number] Date of Birth: 1931/03/26  Subjective/Objective:       Pt admitted with ICH. He is from home with family. PCP:     Dr Heber Lillington Insurance: [employer] medicare         Action/Plan: PT recommending CIR. Awaiting OT eval. CM following for d/c disposition.   Expected Discharge Date:                  Expected Discharge Plan:  [place]  In-House Referral:     Discharge planning Services  CM Consult  Post Acute Care Choice:    Choice offered to:     DME Arranged:    DME Agency:     HH Arranged:    [place] Agency:     Status of Service:  In process, will continue to follow  If discussed at Long Length of Stay Meetings, dates discussed:    Additional Comments:  Pollie Friar, RN 05/08/2018, 12:03 PM

## 2018-05-08 NOTE — Evaluation (Signed)
Physical Therapy Evaluation Patient Details Name: [name] [name] MRN: [id number] DOB: [birth year] Today's Date: 05/08/2018   History of Present Illness  84 yo male with onset of a fall at home which was protected now has a cerebellar hematoma, but has been on blood thinners.  Admitted with bradycardia, low BS, low level of responsiveness at baseline and now.  PMHx:  CAD, HTN, dementia, PE, advanced parkinson's, CHF with EF 40-45% per last report,   Clinical Impression  Pt was seen for evaluation of mobility and transition to chair, along with sitting balance and control of standing and stepping.  He is minimally verbal but very calm, pleasant and willing to let PT help him.  Has tendency to strong lean to R and compensated with chair positioning.  Will recommend to CIR as pt is home with 24/7 caregivers and was previously ambulatory, and has a strong family involvement as well.  Follow acutely for strengthening and standing practice along with balance training as tolerated.       Follow Up Recommendations CIR    Equipment Recommendations  None recommended by PT    Recommendations for Other Services Rehab consult     Precautions / Restrictions Precautions Precautions: Fall(has been on blood thinners) Restrictions Weight Bearing Restrictions: No      Mobility  Bed Mobility Overal bed mobility: Needs Assistance Bed Mobility: Supine to Sit     Supine to sit: Mod assist     General bed mobility comments: pt assists rolling to mod assist then mod to lift trunk and help pt clear his legs from the bed to side sit  Transfers Overall transfer level: Needs assistance Equipment used: 1 person hand held assist Transfers: Sit to/from Stand;Stand Pivot Transfers Sit to Stand: Max assist Stand pivot transfers: Max assist;+2 safety/equipment       General transfer comment: pivot requires a second person to assist the transition safely due to lack of steps taken by pt and his resistance to  moving from static standing  Ambulation/Gait             General Gait Details: unable(PLOF was 2 HHA due to pt releasing walker with hands)  Stairs            Wheelchair Mobility    Modified Rankin (Stroke Patients Only) Modified Rankin (Stroke Patients Only) Pre-Morbid Rankin Score: Moderate disability Modified Rankin: Moderately severe disability     Balance Overall balance assessment: History of Falls;Needs assistance Sitting-balance support: Feet supported;Bilateral upper extremity supported Sitting balance-Leahy Scale: Fair Sitting balance - Comments: fair once set Postural control: Right lateral lean Standing balance support: Bilateral upper extremity supported;During functional activity Standing balance-Leahy Scale: Poor Standing balance comment: pt is reluctant to pivot from static standing                             Pertinent Vitals/Pain Pain Assessment: Faces Faces Pain Scale: No hurt    Home Living Family/patient expects to be discharged to:: Private residence Living Arrangements: Children Available Help at Discharge: Family;Available 24 hours/day;Personal care attendant Type of Home: House Home Access: Stairs to enter Entrance Stairs-Rails: Psychiatric nurse of Steps: 4 Home Layout: Able to live on main level with bedroom/bathroom;Two level Home Equipment: Walker - 2 wheels;Bedside commode;Wheelchair - Sport and exercise psychologist Comments: doesn't use walker just HHA for gait    Prior Function Level of Independence: Needs assistance   Gait / Transfers Assistance Needed: wc or 2 HHA to  walk as pt will release walker per family  ADL's / Homemaking Assistance Needed: Assist with ADLs from PCA.  Comments: Daughter works but has aides come in so pt is never alone.     Hand Dominance   Dominant Hand: Right    Extremity/Trunk Assessment   Upper Extremity Assessment Upper Extremity Assessment: Generalized  weakness;LUE deficits/detail LUE Deficits / Details: LUE is used less functionally by pt LUE Coordination: decreased gross motor;decreased fine motor    Lower Extremity Assessment Lower Extremity Assessment: LLE deficits/detail;RLE deficits/detail RLE Deficits / Details: generally weak and has tactile cues for active movement RLE Coordination: decreased fine motor;decreased gross motor LLE Deficits / Details: tactile cues for active movement in the chair LLE Coordination: decreased gross motor;decreased fine motor    Cervical / Trunk Assessment Cervical / Trunk Assessment: Kyphotic  Communication   Communication: Expressive difficulties  Cognition Arousal/Alertness: Awake/alert Behavior During Therapy: Flat affect Overall Cognitive Status: History of cognitive impairments - at baseline                                 General Comments: pt is cared for at home 24/7      General Comments      Exercises     Assessment/Plan    PT Assessment Patient needs continued PT services  PT Problem List Decreased strength;Decreased range of motion;Decreased activity tolerance;Decreased balance;Decreased mobility;Decreased coordination;Decreased cognition;Decreased safety awareness       PT Treatment Interventions DME instruction;Gait training;Functional mobility training;Therapeutic activities;Therapeutic exercise;Balance training;Neuromuscular re-education;Patient/family education    PT Goals (Current goals can be found in the Care Plan section)  Acute Rehab PT Goals Patient Stated Goal: none stated PT Goal Formulation: With family Time For Goal Achievement: 05/22/18 Potential to Achieve Goals: Good    Frequency Min 3X/week   Barriers to discharge Decreased caregiver support needs 2 person assist just to transfer now    Co-evaluation               AM-PAC PT "6 Clicks" Daily Activity  Outcome Measure Difficulty turning over in bed (including adjusting  bedclothes, sheets and blankets)?: Unable Difficulty moving from lying on back to sitting on the side of the bed? : Unable Difficulty sitting down on and standing up from a chair with arms (e.g., wheelchair, bedside commode, etc,.)?: Unable Help needed moving to and from a bed to chair (including a wheelchair)?: A Lot Help needed walking in hospital room?: Total Help needed climbing 3-5 steps with a railing? : Total 6 Click Score: 7    End of Session Equipment Utilized During Treatment: Gait belt Activity Tolerance: Patient tolerated treatment well;Other (comment)(limited by weakness) Patient left: in chair;with call bell/phone within reach;with chair alarm set;with family/visitor present Nurse Communication: Mobility status PT Visit Diagnosis: Unsteadiness on feet (R26.81);Muscle weakness (generalized) (M62.81);History of falling (Z91.81);Adult, failure to thrive (R62.7)    Time: 9509-3267 PT Time Calculation (min) (ACUTE ONLY): 36 min   Charges:   PT Evaluation $PT Eval Moderate Complexity: 1 Mod PT Treatments $Therapeutic Activity: 8-22 mins        Ramond Dial 05/08/2018, 11:10 AM   Mee Hives, PT MS Acute Rehab Dept. Number: [place]

## 2018-05-08 NOTE — Evaluation (Signed)
Occupational Therapy Evaluation Patient Details Name: [name] [name] MRN: [id number] DOB: 06/15/1931 Today's Date: 05/08/2018    History of Present Illness 84 yo male with onset of a fall at home which was protected now has a cerebellar hematoma, but has been on blood thinners.  Admitted with bradycardia, low BS, low level of responsiveness at baseline and now.  PMHx:  CAD, HTN, dementia, PE, advanced parkinson's, CHF with EF 40-45% per last report,    Clinical Impression   Pt typically walks with B hand held assist and is dependent in all ADL with exception of self feeding which he does with a spoon only with set up and cues for task persistence. Pt currently requires max assist for mobility and was not able to ambulate this visit. He has excellent family and caregiver support. Will follow acutely.    Follow Up Recommendations  Supervision/Assistance - 24 hour    Equipment Recommendations  None recommended by OT    Recommendations for Other Services       Precautions / Restrictions Precautions Precautions: Fall Restrictions Weight Bearing Restrictions: No      Mobility Bed Mobility Overal bed mobility: Needs Assistance Bed Mobility: Supine to Sit     Supine to sit: Mod assist     General bed mobility comments: pt in chair  Transfers Overall transfer level: Needs assistance Equipment used: 1 person hand held assist Transfers: Sit to/from Stand;Stand Pivot Transfers Sit to Stand: Max assist;+2 safety/equipment Stand pivot transfers: Max assist;+2 safety/equipment       General transfer comment: pt resistant to movement    Balance Overall balance assessment: History of Falls;Needs assistance Sitting-balance support: Feet supported;Bilateral upper extremity supported Sitting balance-Leahy Scale: Fair Sitting balance - Comments: fair once set Postural control: Right lateral lean Standing balance support: Bilateral upper extremity supported;During functional  activity Standing balance-Leahy Scale: Poor Standing balance comment: pt with difficulty initiating movement                           ADL either performed or assessed with clinical judgement   ADL                                         General ADL Comments: currently needing total assist     Vision Patient Visual Report: (central vision most effective)       Perception     Praxis      Pertinent Vitals/Pain Pain Assessment: Faces Faces Pain Scale: No hurt     Hand Dominance Right   Extremity/Trunk Assessment Upper Extremity Assessment Upper Extremity Assessment: RUE deficits/detail;LUE deficits/detail RUE Deficits / Details: maintains hand flexed, but has full AROM RUE Coordination: decreased fine motor;decreased gross motor LUE Deficits / Details: maintains hand flexed, but demonstrates full AROM LUE Coordination: decreased fine motor;decreased gross motor   Lower Extremity Assessment Lower Extremity Assessment: Defer to PT evaluation RLE Deficits / Details: generally weak and has tactile cues for active movement RLE Coordination: decreased fine motor;decreased gross motor LLE Deficits / Details: tactile cues for active movement in the chair LLE Coordination: decreased gross motor;decreased fine motor   Cervical / Trunk Assessment Cervical / Trunk Assessment: Kyphotic   Communication Communication Communication: Expressive difficulties   Cognition Arousal/Alertness: Awake/alert Behavior During Therapy: Flat affect Overall Cognitive Status: History of cognitive impairments - at baseline  General Comments: pt picking at items in environment randomly   General Comments       Exercises Exercises: Other exercises(strength is less than 3/5 actively but can support in standi)   Shoulder Instructions      Home Living Family/patient expects to be discharged to:: Private residence Living  Arrangements: Children Available Help at Discharge: Family;Available 24 hours/day;Personal care attendant Type of Home: House Home Access: Stairs to enter [employer] of Steps: 4 Entrance Stairs-Rails: Right;Left Home Layout: Able to live on main level with bedroom/bathroom;Two level         Bathroom Toilet: Standard     Home Equipment: Walker - 2 wheels;Bedside commode;Wheelchair - Biomedical scientist Comments: doesn't use walker just B HHA for gait      Prior Functioning/Environment Level of Independence: Needs assistance  Gait / Transfers Assistance Needed: wc or 2 HHA to walk as pt will release walker per family ADL's / Homemaking Assistance Needed: self feeds with min assist, total assist otherwise in ADL Communication / Swallowing Assistance Needed: minimally conversant Comments: Daughter works but has aides come in so pt is never alone.        OT Problem List:        OT Treatment/Interventions: Self-care/ADL training;Patient/family education    OT Goals(Current goals can be found in the care plan section) Acute Rehab OT Goals Patient Stated Goal: return to prior level of mobility, self feed OT Goal Formulation: With family Time For Goal Achievement: 05/15/18 Potential to Achieve Goals: Fair  OT Frequency: Min 2X/week   Barriers to D/C:            Co-evaluation              AM-PAC PT "6 Clicks" Daily Activity     Outcome Measure Help from another person eating meals?: Total Help from another person taking care of personal grooming?: Total Help from another person toileting, which includes using toliet, bedpan, or urinal?: Total Help from another person bathing (including washing, rinsing, drying)?: Total Help from another person to put on and taking off regular upper body clothing?: Total Help from another person to put on and taking off regular lower body clothing?: Total 6 Click Score: 6   End of Session Equipment Utilized  During Treatment: Gait belt  Activity Tolerance: Patient tolerated treatment well Patient left: in chair;with call bell/phone within reach;with family/visitor present;with nursing/sitter in room  OT Visit Diagnosis: Unsteadiness on feet (R26.81);Other abnormalities of gait and mobility (R26.89);Cognitive communication deficit (R41.841)                Time: 2130-8657 OT Time Calculation (min): 21 min Charges:  OT General Charges $OT Visit: 1 Visit OT Evaluation $OT Eval Moderate Complexity: 1 Mod  Malka So 05/08/2018, 12:54 PM  05/08/2018 Nestor Lewandowsky, OTR/L Pager: [phone]

## 2018-05-09 LAB — GLUCOSE, CAPILLARY
GLUCOSE-CAPILLARY: 80 mg/dL (ref 70–99)
Glucose-Capillary: 90 mg/dL (ref 70–99)
Glucose-Capillary: 92 mg/dL (ref 70–99)
Glucose-Capillary: 116 mg/dL — ABNORMAL HIGH (ref 70–99)

## 2018-05-09 LAB — PROTIME-INR
INR: 1.15
PROTHROMBIN TIME: 14.6 s (ref 11.4–15.2)

## 2018-05-09 MED ORDER — ADULT MULTIVITAMIN W/MINERALS CH
1.0000 | ORAL_TABLET | Freq: Every day | ORAL | Status: DC
  Administered 2018-05-09 – 2018-05-12 (×3): 1 via ORAL
  Filled 2018-05-09 (×4): qty 1

## 2018-05-09 MED ORDER — QUETIAPINE FUMARATE 25 MG PO TABS
12.5000 mg | ORAL_TABLET | Freq: Every day | ORAL | Status: DC
  Administered 2018-05-10 – 2018-05-11 (×2): 12.5 mg via ORAL
  Filled 2018-05-09 (×2): qty 1

## 2018-05-09 MED ORDER — LORATADINE 10 MG PO TABS
10.0000 mg | ORAL_TABLET | Freq: Every day | ORAL | Status: DC
  Administered 2018-05-09 – 2018-05-12 (×3): 10 mg via ORAL
  Filled 2018-05-09 (×4): qty 1

## 2018-05-09 MED ORDER — OMEGA-3 FATTY ACIDS 1000 MG PO CAPS: 2.0000 g | ORAL_CAPSULE | Freq: Every day | ORAL | Status: DC

## 2018-05-09 MED ORDER — OMEGA-3-ACID ETHYL ESTERS 1 G PO CAPS
1.0000 g | ORAL_CAPSULE | Freq: Every day | ORAL | Status: DC
  Administered 2018-05-09 – 2018-05-12 (×3): 1 g via ORAL
  Filled 2018-05-09 (×5): qty 1

## 2018-05-09 MED ORDER — ASPIRIN 81 MG PO CHEW: 81.0000 mg | CHEWABLE_TABLET | Freq: Every day | ORAL | Status: DC | PRN

## 2018-05-09 NOTE — [place] FL2 (Addendum)
[place] MEDICAID FL2 LEVEL OF CARE SCREENING TOOL     IDENTIFICATION  Patient Name: [name] [name] Birthdate: 11-14-1930 Sex: male Admission Date (Current Location): 05/05/2018  [facility] and [place] Number:  [employer] and Address:  [facility] [place], [place] [address] [place], [place] [address]      Provider Number: [id number]  Attending Physician Name and Address:  Garvin Fila, MD  Relative Name and Phone Number:       Current Level of Care: Hospital Recommended Level of Care: [place] Prior Approval Number:    Date Approved/Denied:   PASRR Number: [id number]  Discharge Plan: SNF    Current Diagnoses: Patient Active Problem List   Diagnosis Date Noted  . Hemorrhagic stroke ([place])   . Dysphagia, post-stroke   . Benign essential HTN   . Parkinson disease ([place])   . Anemia of chronic disease   . ICH (intracerebral hemorrhage) ([place]) 05/05/2018  . Hyperkalemia 05/05/2018  . Chalazion left upper eyelid 04/11/2018  . Lethargy 11/10/2017  . Fatigue 09/08/2017  . Need for immunization against influenza 09/08/2017  . Recurrent UTI 06/27/2017  . Pressure sore on buttocks 06/27/2017  . Generalized abdominal pain   . Volvulus of sigmoid colon ([place]) 05/31/2017  . Aortic atherosclerosis ([place]) 05/31/2017  . Loss of weight 09/23/2016  . Abdominal distension   . Long term current use of anticoagulant   . Right bundle branch block   . Hypokalemia 08/02/2016  . Diverticulosis 07/15/2016  . Statin myopathy 03/15/2016  . Parkinsonian syndrome ([place]) 08/11/2015  . Normocytic anemia, not due to blood loss 04/16/2015  . Chronic systolic heart failure ([place]) 04/16/2015  . Falls frequently 04/16/2015  . Onychomycosis 04/15/2015  . CAD (coronary artery disease) 01/16/2015  . 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 Healthcare maintenance 10/10/2014  . Lumbosacral spondylosis without myelopathy 09/24/2013  . Hypernatremia 02/20/2013  .  Bradycardia 02/20/2013  . Generalized ischemic cerebrovascular disease 02/05/2013  . Depression 01/25/2013  . Insomnia 01/20/2012  . Alzheimer's dementia 08/17/2011  . History of pulmonary embolism 09/18/2010  . Encounter for long-term use of antiplatelets/antithrombotics 09/18/2010  . DJD (degenerative joint disease) 08/06/2010  . ADENOCARCINOMA, PROSTATE, HX OF 08/06/2010  . HLD (hyperlipidemia) 10/15/2009  . Essential hypertension, benign 10/15/2009  . GERD 10/15/2009    Orientation RESPIRATION BLADDER Height & Weight     Self  Normal Incontinent Weight: 136 lb 0.4 oz (61.7 kg) Height:  5\' 8"  (172.7 cm)  BEHAVIORAL SYMPTOMS/MOOD NEUROLOGICAL BOWEL NUTRITION STATUS      Incontinent Diet(mechanical soft)  AMBULATORY STATUS COMMUNICATION OF NEEDS Skin   Extensive Assist Verbally Normal                       Personal Care Assistance Level of Assistance  Bathing, Feeding, Dressing Bathing Assistance: Maximum assistance Feeding assistance: Limited assistance Dressing Assistance: Maximum assistance     Functional Limitations Info  Sight, Hearing, Speech Sight Info: Adequate Hearing Info: Adequate Speech Info: Adequate    SPECIAL CARE FACTORS FREQUENCY  PT (By licensed PT), OT (By licensed OT), Speech therapy     PT Frequency: 5x/wk OT Frequency: 5x/wk     Speech Therapy Frequency: 5x/wk      Contractures Contractures Info: Not present    Additional Factors Info  Code Status, Allergies, Psychotropic Code Status Info: Full Allergies Info: Aricept Donepezil Hcl, Shrimp Shellfish Allergy Psychotropic Info: Seroquel 25mg  daily at bed  Current Medications (05/09/2018):  This is the current hospital active medication list Current Facility-Administered Medications  Medication Dose Route Frequency Provider Last Rate Last Dose  .  stroke: mapping our early stages of recovery book   Does not apply Once Amie Portland, MD      . acetaminophen (TYLENOL) tablet  650 mg  650 mg Oral Q4H PRN Amie Portland, MD       Or  . acetaminophen (TYLENOL) solution 650 mg  650 mg Per Tube Q4H PRN Amie Portland, MD       Or  . acetaminophen (TYLENOL) suppository 650 mg  650 mg Rectal Q4H PRN Amie Portland, MD      . carbidopa-levodopa (SINEMET IR) 25-100 MG per tablet immediate release 1 tablet  1 tablet Oral QID Amie Portland, MD   1 tablet at 05/09/18 [phone]  . famotidine (PEPCID) IVPB 20 mg premix  20 mg Intravenous Q12H Amie Portland, MD   Stopped at 05/09/18 [phone]  . QUEtiapine (SEROQUEL) tablet 25 mg  25 mg Oral QHS Aroor, Lanice Schwab, MD   25 mg at 05/08/18 1810  . senna-docusate (Senokot-S) tablet 1 tablet  1 tablet Oral BID Amie Portland, MD   1 tablet at 05/08/18 1047     Discharge Medications: Please see discharge summary for a list of discharge medications.  Relevant Imaging Results:  Relevant Lab Results:   Additional Information SS#: [id number]  Geralynn Ochs, LCSW  I have personally examined this patient, reviewed notes, independently viewed imaging studies, participated in medical decision making and plan of care.ROS completed by me personally and pertinent positives fully documented  I have made any additions or clarifications directly to the above note. Agree with note above.   [name], MD Medical Director [facility] Pager: [phone] 05/09/2018 2:53 PM

## 2018-05-09 NOTE — Progress Notes (Signed)
Inpatient Rehabilitation-Admissions Coordinator   [facility] still awaits determination by insurance company. Will follow up tomorrow.   Jhonnie Garner, OTR/L  Rehab Admissions Coordinator  [phone] 05/09/2018 5:44 PM

## 2018-05-09 NOTE — Progress Notes (Addendum)
STROKE TEAM PROGRESS NOTE   SUBJECTIVE (INTERVAL HISTORY) His daughter is at the bedside. She states she does not see much change from baseline. She is unclear when the stroke occurred. She is anxious to have aspirin restarted if safe given storke risk and the lab saying that his blood is clotting (when they draw blood, they have to warm his hand).  Dr. Leonie Man discussed d/c plan with dtr.    OBJECTIVE Vitals:   05/08/18 2009 05/09/18 0046 05/09/18 0446 05/09/18 0854  BP: (!) 156/79 (!) 145/99 (!) 147/91 (!) 140/102  Pulse: 65 61 62 68  Resp:  18 18 15   Temp: 98.6 F (37 C) 98.2 F (36.8 C) 97.7 F (36.5 C) 98.2 F (36.8 C)  TempSrc: Oral Oral Oral Oral  SpO2:  93% 99% 100%  Weight:      Height:        CBC:  Recent Labs  Lab 05/05/18 1556 05/06/18 0622 05/07/18 0822  WBC 4.8 4.7 3.8*  NEUTROABS 3.5  --   --   HGB 13.3 12.0* 12.1*  HCT 42.9 37.7* 38.2*  MCV 100.2* 96.9 97.7  PLT 196 158 956    Basic Metabolic Panel:  Recent Labs  Lab 05/05/18 1945 05/06/18 0305 05/07/18 0822  NA 139 140 137  K 4.3 4.4 3.7  CL 108 105 103  CO2 23 23 26   GLUCOSE 72 80 121*  BUN 10 12 13   CREATININE 0.82 0.79 1.12  CALCIUM 9.4 9.5 8.9  MG 1.7 1.6*  --   PHOS  --  3.4  --      IMAGING Ct Head Wo Contrast 05/05/2018 1. Small focus of hemorrhage in the right posterior fossa, likely intraparenchymal within the cerebellum. No associated mass effect.  2. Severe chronic ischemic microangiopathy.   MRI Brain Wo Contrast 05/06/2018 1. Subacute hematoma right cerebellum with methemoglobin formation and surrounding edema. 2. Advanced atrophy and advanced chronic microvascular ischemia 3. Enlarged pituitary 13 mm consistent with adenoma.  Ct Head Wo Contrast 05/06/2018 Stable appearance of focal hyperdensity in the right posterior fossa. This could represent a cerebellar hemorrhage or calcified meningioma. No progression.   Dg Chest 2 View 05/05/2018 Stable elevation of left  hemidiaphragm. Mild bibasilar atelectasis. No edema or consolidation. Stable cardiac silhouette.   PHYSICAL EXAM  per Dr. Leonie Man Frail elderly african american male not in distress. . Afebrile. Head is nontraumatic. Neck is supple without bruit.    Cardiac exam no murmur or gallop. Lungs are clear to auscultation. Distal pulses are well felt. Neurological Exam Awake alert disoriented.  Slow to respond to questions.  Diminished attention, registration and recall.  Speaks in a hypophonic voice.  Follows simple midline and one-step commands.  Extraocular movements are full range without nystagmus.  Blinks to threat bilaterally.  Face is symmetric.  Diminished facial expression.  Positive glabellar tap.  Motor system exam reveals symmetric upper extremity strength but variable and poor effort.  Mild proximal lower extremity weakness but again effort is poor and variable.  No focal weakness.  Mild action tremor of outstretched upper extremities.  Cogwheel rigidity present bilaterally.  Gait not tested.   ASSESSMENT/PLAN Mr. Graham Hyun is an 84 y.o. male with history of CAD, HTN, HLD, dementia, PE (on warfarin), advanced parkinson's disease, CHF with LVEF of 40 to 45% documented in 2016 presenting with bradycardia and found to have hemorrhage on Head CT.  He did not receive IV t-PA due to [place].  Stroke:  Small R Cerebellum  ICH - non traumatic warfarin associated coagulopathy  CT head - Small focus of hemorrhage in the right posterior fossa, likely intraparenchymal within the cerebellum.  MRI head - Subacute hematoma right cerebellum with methemoglobin formation and surrounding edema.  MRA head - not performed  Carotid Doppler - not indicated  2D Echo - not indicated  Loop recorder - now deactivated. Never showed AF. Pt opted to leave in.  LDL - 148  HgbA1c - 5.9  VTE prophylaxis - SCDs  Diet - Dysphagia 3 thin liquids  warfarin daily also on ASA 81 mg daily prior to admission, now on  No antithrombotic due to hmg. It has been 4 days since stroke onset. Ok to start low dose aspirin today. Check imaging prior to restarting warfarin in abot 2 weeks  Ongoing aggressive stroke risk factor management  Therapy recommendations:  CIR. They are following. Await insurance approval  Disposition:  Pending  Medically ready for d/c once bed available (dtr open to SNF placement if CIR not available)  PE   on warfarin at time of admission  Reversed in the ED  Check imaging prior to restarting warfarin in abot 2 weeks  Hypertension  BP high as 157/127 on arrival . BP goal < 160  . BP 140-150s . Resume home BP medications . BP goal normotensive  Hyperlipidemia  Lipid lowering medication PTA:  Fish oil  LDL 148, goal < 70  Current lipid lowering medication: none   Resume fish oil  Consider statin at time of follow up  Other Stroke Risk Factors  Advanced age  Coronary artery disease  Other Active Problems  Parkinson's disease  Hyperkalemia resolved - (was on KCL 40 meq daily PTA)  4.7 -> 3.8  Bradycardia most likely related to PD, asymptomatic from it, continue to monitor  Sedated following late seroquel dose over the weekend, resolved. On 12.5 at home, ordered 25 here. Will decrease to home dose.  Hospital day # [place], MSN, APRN, ANVP-BC, AGPCNP-BC Advanced Practice Stroke Nurse [place] for Schedule & Pager information 05/09/2018 10:37 AM  I have personally examined this patient, reviewed notes, independently viewed imaging studies, participated in medical decision making and plan of care.ROS completed by me personally and pertinent positives fully documented  I have made any additions or clarifications directly to the above note. Agree with note above.  Plan  discussed with the patient's daughter at the bedside and answered questions about his care. Recommend transfer to [facility] or SNF when bed available.greater than 50%  time during this 25 minute visit was spent in counseling and coordination of care about his intracerebral hemorrhage in Parkinson's disease and answered questions  [name], MD Medical Director [facility] Pager: [phone] 05/09/2018 1:29 PM   To contact Stroke Continuity provider, please refer to [email or web address] After hours, contact General Neurology

## 2018-05-10 DIAGNOSIS — I619 Nontraumatic intracerebral hemorrhage, unspecified: Secondary | ICD-10-CM

## 2018-05-10 DIAGNOSIS — R7303 Prediabetes: Secondary | ICD-10-CM

## 2018-05-10 LAB — GLUCOSE, CAPILLARY
GLUCOSE-CAPILLARY: 79 mg/dL (ref 70–99)
Glucose-Capillary: 80 mg/dL (ref 70–99)

## 2018-05-10 LAB — PROTIME-INR
INR: 1.16
PROTHROMBIN TIME: 14.7 s (ref 11.4–15.2)

## 2018-05-10 NOTE — Discharge Summary (Addendum)
Stroke Discharge Summary  Patient ID: [name] [name]   MRN: [id number]      DOB: 1931/06/06  Date of Admission: 05/05/2018 Date of Discharge: 05/12/2018  Attending Physician:  Garvin Fila, MD, Stroke MD Consultant(s):     Delice Lesch, MD (Physical Medicine & Rehabtilitation)  Patient's PCP:  [name], DO  DISCHARGE DIAGNOSIS:  Principal Problem:   ICH (intracerebral hemorrhage) ([place]) Active Problems:   HLD (hyperlipidemia)   History of pulmonary embolism   Alzheimer's dementia   CAD (coronary artery disease)   Hyperkalemia   Dysphagia, post-stroke   Benign essential HTN   Parkinson disease (HCC)   Anemia of chronic disease   Hemorrhagic stroke ([place])   Prediabetes   Past Medical History:  Diagnosis Date  . Anxiety   . Coronary artery disease, non-occlusive    with history of MI; Cath 2008 w multivessel nonobstructive CAD  . Dementia   . Depression   . GERD (gastroesophageal reflux disease)   . Hyperlipidemia   . Hypertension   . Memory loss   . Parkinson disease ([place])   . PE (pulmonary embolism)    unprovoked PE completed 6 months of warfarin, warfarin d/ced 02/12/2010, repeat PE 07/10/2010 after a long car ride and now on lifelong coumadin.  . Pituitary microadenoma ([place])    incidental finding CT 12/2009  . Prostate cancer ([place])   . Scoliosis   . Sigmoid volvulus ([place]) 08/02/2016  . Volvulus of colon ([place]) 06/01/2017   Past Surgical History:  Procedure Laterality Date  . CATARACT EXTRACTION    . COLONOSCOPY N/A 05/30/2017   Procedure: COLONOSCOPY;  Surgeon: Ronald Lobo, MD;  Location: [facility] ENDOSCOPY;  Service: Endoscopy;  Laterality: N/A;  . FLEXIBLE SIGMOIDOSCOPY N/A 08/02/2016   Procedure: FLEXIBLE SIGMOIDOSCOPY;  Surgeon: Ronald Lobo, MD;  Location: [facility] ENDOSCOPY;  Service: Endoscopy;  Laterality: N/A;  . FLEXIBLE SIGMOIDOSCOPY N/A 08/06/2016   Procedure: FLEXIBLE SIGMOIDOSCOPY;  Surgeon: Ronald Lobo, MD;  Location: [facility] ENDOSCOPY;  Service:  Endoscopy;  Laterality: N/A;  . FLEXIBLE SIGMOIDOSCOPY Left 06/02/2017   Procedure: FLEXIBLE SIGMOIDOSCOPY;  Surgeon: Arta Silence, MD;  Location: [facility] ENDOSCOPY;  Service: Endoscopy;  Laterality: Left;  . FLEXIBLE SIGMOIDOSCOPY N/A 06/07/2017   Procedure: FLEXIBLE SIGMOIDOSCOPY for possible decompression;  Surgeon: Otis Brace, MD;  Location: [place];  Service: Gastroenterology;  Laterality: N/A;  . LEFT HEART CATHETERIZATION WITH CORONARY ANGIOGRAM N/A 12/10/2014   Procedure: LEFT HEART CATHETERIZATION WITH CORONARY ANGIOGRAM;  Surgeon: [name], MD;  Location: [facility] CATH LAB;  Service: Cardiovascular;  Laterality: N/A;  . LOOP RECORDER IMPLANT N/A 12/16/2014   Procedure: LOOP RECORDER IMPLANT;  Surgeon: [name], MD;  Location: [facility] CATH LAB;  Service: Cardiovascular;  Laterality: N/A;  . PARTIAL COLECTOMY N/A 06/08/2017   Procedure: SIGMOID  COLECTOMY;  Surgeon: Georganna Skeans, MD;  Location: [place];  Service: General;  Laterality: N/A;  . PROSTATECTOMY      Allergies as of 05/12/2018      Reactions   Aricept Reather [place]ittler Hcl] Other (See Comments)   Symptomatic Bradycardia.   Shrimp [shellfish Allergy] Other (See Comments)   Causes gout      Medication List    TAKE these medications   acetaminophen 500 MG tablet Commonly known as:  TYLENOL Take 1,000 mg by mouth 2 (two) times daily.   amLODipine 2.5 MG tablet Commonly known as:  NORVASC Take 1 tablet (2.5 mg total) by mouth daily. What changed:    when to  take this  reasons to take this   aspirin 81 MG chewable tablet Chew 1 tablet (81 mg total) by mouth daily as needed for up to 6 days (chest pain).   atorvastatin 10 MG tablet Commonly known as:  LIPITOR Take 1 tablet (10 mg total) by mouth daily at 6 PM.   b complex vitamins tablet Take 1 tablet by mouth daily.   Ca Phosphate-Cholecalciferol 250-350 MG-UNIT Chew Chew 1 Dose by mouth 2 (two) times daily. What changed:  how much to take    carbidopa-levodopa 25-100 MG tablet Commonly known as:  SINEMET IR Take 1 tablet by mouth 4 (four) times daily.   fish oil-omega-3 fatty acids 1000 MG capsule Take 2 g by mouth daily.   loratadine 10 MG tablet Commonly known as:  CLARITIN Take 10 mg by mouth daily.   memantine 28 MG Cp24 24 hr capsule Commonly known as:  NAMENDA XR TAKE 1 CAPSULE BY MOUTH EVERY DAY IN THE MORNING What changed:    how much to take  how to take this  when to take this  additional instructions   multivitamin with minerals tablet Take 1 tablet by mouth daily.   polyethylene glycol packet Commonly known as:  MIRALAX / GLYCOLAX Take 17 g by mouth daily as needed (constipation).   potassium chloride SA 20 MEQ tablet Commonly known as:  K-DUR,KLOR-CON Take 2 tablets (40 mEq total) by mouth daily.   QUEtiapine 25 MG tablet Commonly known as:  SEROQUEL Take 0.5 tablets (12.5 mg total) by mouth at bedtime. What changed:  See the new instructions.   senna 8.6 MG Tabs tablet Commonly known as:  SENOKOT Take 1 tablet (8.6 mg total) by mouth daily as needed for mild constipation.   vitamin C 500 MG tablet Commonly known as:  ASCORBIC ACID Take 1,000 mg by mouth daily.   warfarin 5 MG tablet Commonly known as:  COUMADIN Take as directed. If you are unsure how to take this medication, talk to your nurse or doctor. Original instructions:  Take 0.5-1 tablets (2.5-5 mg total) by mouth See admin instructions. Take one tablet (5 mg) by mouth on Monday, Wednesday, Friday at 6 pm, take 1/2 tablet (2.5 mg) on Sunday, Tuesday, Thursday, Saturday at 6pm Start taking on:  05/19/2018 What changed:  These instructions start on 05/19/2018. If you are unsure what to do until then, ask your doctor or other care provider.       LABORATORY STUDIES CBC    Component Value Date/Time   WBC 3.8 (L) 05/07/2018 0822   RBC 3.91 (L) 05/07/2018 0822   HGB 12.1 (L) 05/07/2018 0822   HGB 13.1 12/09/2016 1114   HCT  38.2 (L) 05/07/2018 0822   HCT 40.8 12/09/2016 1114   PLT 153 05/07/2018 0822   PLT 186 12/09/2016 1114   MCV 97.7 05/07/2018 0822   MCV 91 12/09/2016 1114   MCH 30.9 05/07/2018 0822   MCHC 31.7 05/07/2018 0822   RDW 14.4 05/07/2018 0822   RDW 16.3 (H) 12/09/2016 1114   LYMPHSABS 0.9 05/05/2018 1556   MONOABS 0.2 05/05/2018 1556   EOSABS 0.1 05/05/2018 1556   BASOSABS 0.0 05/05/2018 1556   CMP    Component Value Date/Time   NA 137 05/07/2018 0822   NA 139 11/17/2017 1155   K 3.7 05/07/2018 0822   CL 103 05/07/2018 0822   CO2 26 05/07/2018 0822   GLUCOSE 121 (H) 05/07/2018 0822   BUN 13 05/07/2018 3151  BUN 7 (L) 11/17/2017 1155   CREATININE 1.12 05/07/2018 0822   CREATININE 0.96 12/23/2014 1214   CALCIUM 8.9 05/07/2018 0822   PROT 6.8 05/05/2018 1556   PROT 6.7 02/17/2017 1204   ALBUMIN 3.6 05/05/2018 1556   ALBUMIN 4.2 02/17/2017 1204   AST 60 (H) 05/05/2018 1556   ALT 9 05/05/2018 1556   ALKPHOS 40 05/05/2018 1556   BILITOT 1.4 (H) 05/05/2018 1556   BILITOT 0.3 02/17/2017 1204   GFRNONAA 58 (L) 05/07/2018 0822   GFRNONAA 74 12/23/2014 1214   GFRAA >60 05/07/2018 0822   GFRAA 85 12/23/2014 1214   COAGS Lab Results  Component Value Date   INR 1.16 05/10/2018   INR 1.15 05/09/2018   INR 1.13 05/08/2018   Lipid Panel    Component Value Date/Time   CHOL 242 (H) 05/05/2018 1556   CHOL 183 07/08/2016 1035   TRIG 157 (H) 05/05/2018 1556   HDL 63 05/05/2018 1556   HDL 82 07/08/2016 1035   CHOLHDL 3.8 05/05/2018 1556   VLDL 31 05/05/2018 1556   LDLCALC 148 (H) 05/05/2018 1556   LDLCALC 82 07/08/2016 1035   HgbA1C  Lab Results  Component Value Date   HGBA1C 5.9 (H) 05/05/2018   Urinalysis    Component Value Date/Time   COLORURINE STRAW (A) 05/05/2018 1757   APPEARANCEUR CLEAR 05/05/2018 1757   APPEARANCEUR Clear 11/11/2017 0900   LABSPEC 1.008 05/05/2018 1757   PHURINE 7.0 05/05/2018 1757   GLUCOSEU NEGATIVE 05/05/2018 1757   HGBUR NEGATIVE  05/05/2018 1757   BILIRUBINUR NEGATIVE 05/05/2018 1757   BILIRUBINUR Negative 11/11/2017 0900   KETONESUR NEGATIVE 05/05/2018 1757   PROTEINUR NEGATIVE 05/05/2018 1757   UROBILINOGEN 0.2 09/08/2017 1155   UROBILINOGEN 0.2 02/13/2015 1845   NITRITE NEGATIVE 05/05/2018 1757   LEUKOCYTESUR TRACE (A) 05/05/2018 1757   LEUKOCYTESUR Negative 11/11/2017 0900   Urine Drug Screen     Component Value Date/Time   LABOPIA NONE DETECTED 08/11/2011 2331   COCAINSCRNUR NONE DETECTED 08/11/2011 2331   LABBENZ NONE DETECTED 08/11/2011 2331   AMPHETMU NONE DETECTED 08/11/2011 2331   THCU NONE DETECTED 08/11/2011 2331   LABBARB NONE DETECTED 08/11/2011 2331    Alcohol Level    Component Value Date/Time   ETH <10 11/12/2017 1656     SIGNIFICANT DIAGNOSTIC STUDIES Ct Head Wo Contrast 05/05/2018 1. Small focus of hemorrhage in the right posterior fossa, likely intraparenchymal within the cerebellum. No associated mass effect.  2. Severe chronic ischemic microangiopathy.   MRI Brain Wo Contrast 05/06/2018 1. Subacute hematoma right cerebellum with methemoglobin formation and surrounding edema. 2. Advanced atrophy and advanced chronic microvascular ischemia 3. Enlarged pituitary 13 mm consistent with adenoma.  Ct Head Wo Contrast 05/06/2018 Stable appearance of focal hyperdensity in the right posterior fossa. This could represent a cerebellar hemorrhage or calcified meningioma. No progression.   Dg Chest 2 View 05/05/2018 Stable elevation of left hemidiaphragm. Mild bibasilar atelectasis. No edema or consolidation. Stable cardiac silhouette.     HISTORY OF PRESENT ILLNESS  [name] [name] is an 84 y.o. male with PMH of CAD, HTN, HLD, dementia, PE (on warfarin), advanced parkinson's disease, CHF with LVEF of 40 to 45% documented in 2016, presented to [facility] ED for bradycardia. Neurology consulted for hemorrhage found on CT head.  Patient was at home when he had an acute onset of diaphoresis  and decreased alertness. Per family patient is very slow to respond at baseline but was not complaining of pain. Denies fever, any recent  illness or any recent falls. His home health aid states that he did fall about 1 month ago, but she protected his head really well with her hands. Does not believe that he hit his head other than against her hands. He has had similar sweating with bradycardic events and hypotension in the past. Per family patient has a loop recorder but is not a good candidate for a pacemaker. Patient currently taking coumadin.  ED course: Potassium: 6.5, BG: 107, AST: 60 , total bili: 1.4 CT head:  Small focus of hemorrhage in the right posterior fossa, likely intraparenchymal within the cerebellum. No associated mass effect. Severe chronic ischemic microangiopathy.  He was admitted for further evaluation and treatment.   HOSPITAL COURSE [name] is an 84 y.o. male with history of CAD, HTN, HLD, dementia, PE (on warfarin), advanced parkinson's disease, CHF with LVEF of 40 to 45% documented in 2016 presenting with bradycardia and found to have hemorrhage on Head CT.  hemorrhage associated with warfarin use. Reversed on admission. Back on low dose aspirin after 4 days when stable. Plans to resume warfarin in 2 weeks. Essentially back to baseline. Not accepted into CIR. Dtr requested short-term SNF for rehab.   Stroke:  Small R Cerebellum ICH - non traumatic warfarin associated coagulopathy  CT head - Small focus of hemorrhage in the right posterior fossa, likely intraparenchymal within the cerebellum.  MRI head - Subacute hematoma right cerebellum with methemoglobin formation and surrounding edema.  MRA head - not performed  Carotid Doppler - not indicated  2D Echo - not indicated  Loop recorder - now deactivated. Never showed AF. Pt opted to leave in.  LDL - 148  HgbA1c - 5.9  warfarin daily also on ASA 81 mg daily prior to admission, now on aspirin 81  mg daily. OK to resume warfarin in 2 weeks from stroke onset (05/19/2018).  Stop aspirin at that time.  Ongoing aggressive stroke risk factor management  Therapy recommendations:  CIR denied by insurance.  Family requested short-term SNF for rehab.  Disposition:  skilled nursing facility for ongoing   PE   on warfarin at time of admission  Reversed in the ED  Now on aspirin 81  OK to resume warfarin in 2 weeks from stroke onset (05/19/2018) . Stop aspirin at that time.  Hypertension  BP high as 157/127 on arrival  BP now stable 130-150s  Resume home BP medications (amlodipine 2.5 mg daily)  BP goal normotensive  Hyperlipidemia  Lipid lowering medication PTA:  Fish oil  LDL 148, goal < 70  Current lipid lowering medication: fish oil  Add statin at time of discharge  Other Stroke Risk Factors  Advanced age  Coronary artery disease  Other Active Problems  Parkinson's disease  Hyperkalemia resolved - (was on KCL 40 meq daily PTA)  4.7 -> 3.8  Bradycardia most likely related to PD, asymptomatic   Sedated following late seroquel dose over the weekend, resolved. On 12.5 at home, ordered 25 here. Back on Now back on home dose.  DISCHARGE EXAM (Per Dr. Leonie Man) Blood pressure (!) 180/112, pulse 73, temperature 97.7 F (36.5 C), temperature source Axillary, resp. rate 18, height 5\' 8"  (1.727 m), weight 61.7 kg, SpO2 97 %. Frail elderly african [place] male not in distress. . Afebrile. Head is nontraumatic. Neck is supple without bruit.    Cardiac exam no murmur or gallop. Lungs are clear to auscultation. Distal pulses are well felt. Neurological Exam Awake alert disoriented.  Slow  to respond to questions.  Diminished attention, registration and recall.  Speaks in a hypophonic voice.  Follows simple midline and one-step commands.  Extraocular movements are full range without nystagmus.  Blinks to threat bilaterally.  Face is symmetric.  Diminished facial expression.   Positive glabellar tap.  Motor system exam reveals symmetric upper extremity strength but variable and poor effort.  Mild proximal lower extremity weakness but again effort is poor and variable.  No focal weakness.  Mild action tremor of outstretched upper extremities.  Cogwheel rigidity present bilaterally.  Gait not tested.  DISCHARGE PLAN  Disposition:  Discharge to skilled nursing facility for ongoing PT, OT and ST.   aspirin 81 mg daily for secondary stroke prevention. Plan to resume warfarin in 2 weeks post stroke (05/19/2018). Stop aspirin at that time.  Ongoing risk factor control by Primary Care Physician or physicians at [facility] at time of discharge  Follow-up [name], DO or physicians at [facility] in 2 weeks.  Follow-up in [facility] in 4 weeks, office to schedule an appointment.   40 minutes were spent preparing discharge.  Burnetta Sabin, MSN, APRN, ANVP-BC, AGPCNP-BC Advanced Practice Stroke Nurse [place] for Schedule & Pager information 05/12/2018 3:25 PM   I have personally examined this patient, reviewed notes, independently viewed imaging studies, participated in medical decision making and plan of care.ROS completed by me personally and pertinent positives fully documented  I have made any additions or clarifications directly to the above note. Agree with note above.  [name], MD Medical Director [place] Pager: [phone] 05/15/2018 11:03 PM

## 2018-05-10 NOTE — Progress Notes (Signed)
Inpatient Rehabilitation-Admissions Coordinator   Pt's [employer] Medicare has denied admit to CIR. AC has communicated with the SW/CM on the floor regarding need for new dispo. AC will update family. AC will sign off at this time.   Jhonnie Garner, OTR/L  Rehab Admissions Coordinator  [phone] 05/10/2018 3:00 PM

## 2018-05-10 NOTE — Progress Notes (Signed)
HPI: Patient seen sitting up in bed. No family or caregivers available at bedside. Patient unable to communicate.  Review systems: Unable to obtain due to cognition  Physical exam: Gen.: NAD. Vital signs reviewed HEENT: Normocephalic and atraumatic. Cardiovascular RRR. No JVD. Respiratory: Effort normal breath sounds normal. Neurological: Alert Nonverbal and not participating in MMT  Assessment and plan: 1. Right cerebellar hematoma  Patient denied by insurance  Recommend SNF  Further recs by neurology  2. Prediabetes  Monitor  Consider diet/medications if necessary  3. HTN  Monitor and provide prns in accordance with increased physical exertion and pain  4. Advanced Parkinson's disease  Consider palliative care consult  Continue meds

## 2018-05-10 NOTE — Progress Notes (Addendum)
STROKE TEAM PROGRESS NOTE   SUBJECTIVE (INTERVAL HISTORY) His CNA is at the bedside. Awaiting insurance approval for CIR. CNA reports a cough. Will monitor.    OBJECTIVE Vitals:   05/10/18 0057 05/10/18 0426 05/10/18 0857 05/10/18 1153  BP: (!) 147/98 (!) 138/94 130/85 (!) 134/110  Pulse: 63 63 62 65  Resp: 16 18 18 19   Temp: 98 F (36.7 C) 98.2 F (36.8 C) 97.8 F (36.6 C) 98.3 F (36.8 C)  TempSrc: Oral Oral Oral Axillary  SpO2: 99% 100% 98% 98%  Weight:      Height:        CBC:  Recent Labs  Lab 05/05/18 1556 05/06/18 0622 05/07/18 0822  WBC 4.8 4.7 3.8*  NEUTROABS 3.5  --   --   HGB 13.3 12.0* 12.1*  HCT 42.9 37.7* 38.2*  MCV 100.2* 96.9 97.7  PLT 196 158 389    Basic Metabolic Panel:  Recent Labs  Lab 05/05/18 1945 05/06/18 0305 05/07/18 0822  NA 139 140 137  K 4.3 4.4 3.7  CL 108 105 103  CO2 23 23 26   GLUCOSE 72 80 121*  BUN 10 12 13   CREATININE 0.82 0.79 1.12  CALCIUM 9.4 9.5 8.9  MG 1.7 1.6*  --   PHOS  --  3.4  --      IMAGING Ct Head Wo Contrast 05/05/2018 1. Small focus of hemorrhage in the right posterior fossa, likely intraparenchymal within the cerebellum. No associated mass effect.  2. Severe chronic ischemic microangiopathy.   MRI Brain Wo Contrast 05/06/2018 1. Subacute hematoma right cerebellum with methemoglobin formation and surrounding edema. 2. Advanced atrophy and advanced chronic microvascular ischemia 3. Enlarged pituitary 13 mm consistent with adenoma.  Ct Head Wo Contrast 05/06/2018 Stable appearance of focal hyperdensity in the right posterior fossa. This could represent a cerebellar hemorrhage or calcified meningioma. No progression.   Dg Chest 2 View 05/05/2018 Stable elevation of left hemidiaphragm. Mild bibasilar atelectasis. No edema or consolidation. Stable cardiac silhouette.   PHYSICAL EXAM per Dr. Leonie [name] Frail elderly african american male not in distress. . Afebrile. Head is nontraumatic. Neck is supple  without bruit.    Cardiac exam no murmur or gallop. Lungs are clear to auscultation. Distal pulses are well felt. Neurological Exam Awake alert disoriented.  Slow to respond to questions.  Diminished attention, registration and recall.  Speaks in a hypophonic voice.  Follows simple midline and one-step commands.  Extraocular movements are full range without nystagmus.  Blinks to threat bilaterally.  Face is symmetric.  Diminished facial expression.  Positive glabellar tap.  Motor system exam reveals symmetric upper extremity strength but variable and poor effort.  Mild proximal lower extremity weakness but again effort is poor and variable.  No focal weakness.  Mild action tremor of outstretched upper extremities.  Cogwheel rigidity present bilaterally.  Gait not tested.   ASSESSMENT/PLAN [name] is an 84 y.o. male with history of CAD, HTN, HLD, dementia, PE (on warfarin), advanced parkinson's disease, CHF with LVEF of 40 to 45% documented in 2016 presenting with bradycardia and found to have hemorrhage on Head CT.  He did not receive IV t-PA due to [place].  Stroke:  Small R Cerebellum ICH - non traumatic warfarin associated coagulopathy  CT head - Small focus of hemorrhage in the right posterior fossa, likely intraparenchymal within the cerebellum.  MRI head - Subacute hematoma right cerebellum with methemoglobin formation and surrounding edema.  MRA head - not performed  Carotid  Doppler - not indicated  2D Echo - not indicated  Loop recorder - now deactivated. Never showed AF. Pt opted to leave in.  LDL - 148  HgbA1c - 5.9  VTE prophylaxis - SCDs  Diet - Dysphagia 3 thin liquids  warfarin daily also on ASA 81 mg daily prior to admission, now on aspirin 81 mg daily due to hmg. Check imaging prior to restarting warfarin in about 2 weeks  Ongoing aggressive stroke risk factor management  Therapy recommendations:  CIR. They are following. Await insurance  approval  Disposition:  Pending  Medically ready for d/c once bed available (dtr open to SNF placement if CIR not available)  PE   on warfarin at time of admission  Reversed in the ED  INR 1.17. Will stop daily checks  Check imaging prior to restarting warfarin in about 2 weeks  Hypertension  BP high as 157/127 on arrival . BP goal < 160  . BP 130-140s . On home BP medications . BP goal normotensive  Hyperlipidemia  Lipid lowering medication PTA:  Fish oil  LDL 148, goal < 70  Current lipid lowering medication: none   Resume fish oil  Consider statin at time of follow up  Other Stroke Risk Factors  Advanced age  Coronary artery disease  Other Active Problems  Parkinson's disease  Hyperkalemia resolved - (was on KCL 40 meq daily PTA)  4.7 -> 3.8  Bradycardia most likely related to PD, asymptomatic from it, continue to monitor  Sedated following late seroquel dose over the weekend, resolved. On 12.5 at home, ordered 25 here. Will decrease to home dose.  No change in condition d/c CBGs Monitor cough  Hospital day # [name], MSN, APRN, ANVP-BC, AGPCNP-BC Advanced Practice Stroke Nurse [name] [name] for Schedule & Pager information 05/10/2018 2:24 PM  I have personally examined this patient, reviewed notes, independently viewed imaging studies, participated in medical decision making and plan of care.ROS completed by me personally and pertinent positives fully documented  I have made any additions or clarifications directly to the above note. Agree with note above.    [name] Contras, MD Medical Director [facility] Pager: [phone] 05/10/2018 3:52 PM  To contact Stroke Continuity provider, please refer to [email or web address] After hours, contact General Neurology

## 2018-05-10 NOTE — Progress Notes (Signed)
Physical Therapy Treatment Patient Details Name: [name] [name] MRN: [id number] DOB: [birth year] Today's Date: 05/10/2018    History of Present Illness 84 yo male with onset of a fall at home which was protected now has a cerebellar hematoma, but has been on blood thinners.  Admitted with bradycardia, low BS, low level of responsiveness at baseline and now.  PMHx:  CAD, HTN, dementia, PE, advanced parkinson's, CHF with EF 40-45% per last report,     PT Comments    [name] [name] agreeable to PT session this afternoon, however, very resistive to all mobility - per personal aide, this is his baseline when anxious. PT/therapy tech attempting multiple times for sit to stand to progress to transfer to recliner, however patient with extension preference and resistive. Did not further progress mobility for patient/therapist safety. Per CIR AC, patient has been denied CIR admission. PT recs updated for SNF.     Follow Up Recommendations  SNF     Equipment Recommendations  None recommended by PT    Recommendations for Other Services       Precautions / Restrictions Precautions Precautions: Fall Restrictions Weight Bearing Restrictions: No    Mobility  Bed Mobility Overal bed mobility: Needs Assistance Bed Mobility: Rolling;Supine to Sit;Sit to Supine Rolling: Mod assist   Supine to sit: Max assist;+2 for physical assistance Sit to supine: Max assist;+2 for physical assistance   General bed mobility comments: patient very resistive to all mobility  Transfers Overall transfer level: Needs assistance Equipment used: 2 person hand held assist Transfers: Sit to/from Stand Sit to Stand: Total assist;+2 physical assistance         General transfer comment: attempted sit to stand multiple times with patient very resistive to mobility. Not safe to progress further  Ambulation/Gait                 Stairs             Wheelchair Mobility    Modified Rankin (Stroke  Patients Only)       Balance Overall balance assessment: History of Falls;Needs assistance Sitting-balance support: Feet supported;Bilateral upper extremity supported Sitting balance-Leahy Scale: Poor                                      Cognition Arousal/Alertness: Awake/alert Behavior During Therapy: Flat affect Overall Cognitive Status: History of cognitive impairments - at baseline                                        Exercises      General Comments        Pertinent Vitals/Pain Pain Assessment: Faces Faces Pain Scale: No hurt    Home Living                      Prior Function            PT Goals (current goals can now be found in the care plan section) Acute Rehab PT Goals Patient Stated Goal: return to prior level of mobility, self feed PT Goal Formulation: With family Time For Goal Achievement: 05/22/18 Potential to Achieve Goals: Good Progress towards PT goals: Progressing toward goals    Frequency    Min 3X/week      PT Plan Current plan remains appropriate  Co-evaluation              AM-PAC PT "6 Clicks" Daily Activity  Outcome Measure  Difficulty turning over in bed (including adjusting bedclothes, sheets and blankets)?: Unable Difficulty moving from lying on back to sitting on the side of the bed? : Unable Difficulty sitting down on and standing up from a chair with arms (e.g., wheelchair, bedside commode, etc,.)?: Unable Help needed moving to and from a bed to chair (including a wheelchair)?: Total Help needed walking in hospital room?: Total Help needed climbing 3-5 steps with a railing? : Total 6 Click Score: 6    End of Session Equipment Utilized During Treatment: Gait belt Activity Tolerance: Patient tolerated treatment well(resistive) Patient left: in bed;with call bell/phone within reach;with nursing/sitter in room Nurse Communication: Mobility status PT Visit Diagnosis:  Unsteadiness on feet (R26.81);Muscle weakness (generalized) (M62.81);History of falling (Z91.81);Adult, failure to thrive (R62.7)     Time: 6701-4103 PT Time Calculation (min) (ACUTE ONLY): 33 min  Charges:  $Therapeutic Activity: 23-37 mins                    Lanney Gins, PT, DPT 05/10/18 4:19 PM Pager: [phone]

## 2018-05-11 NOTE — Progress Notes (Signed)
STROKE TEAM PROGRESS NOTE   SUBJECTIVE (INTERVAL HISTORY) His  daughter is at the bedside. Awaiting insurance approval for SNF.  [name]    OBJECTIVE Vitals:   05/10/18 2350 05/11/18 0415 05/11/18 0738 05/11/18 1228  BP: (!) 134/93 (!) 167/100 (!) 144/89 132/88  Pulse: 67 64 60 77  Resp:  18 18 18   Temp:  97.7 F (36.5 C) 97.8 F (36.6 C) 98.1 F (36.7 C)  TempSrc:  Oral Oral Oral  SpO2: 97% 99% 98% 100%  Weight:      Height:        CBC:  Recent Labs  Lab 05/05/18 1556 05/06/18 0622 05/07/18 0822  WBC 4.8 4.7 3.8*  NEUTROABS 3.5  --   --   HGB 13.3 12.0* 12.1*  HCT 42.9 37.7* 38.2*  MCV 100.2* 96.9 97.7  PLT 196 158 941    Basic Metabolic Panel:  Recent Labs  Lab 05/05/18 1945 05/06/18 0305 05/07/18 0822  NA 139 140 137  K 4.3 4.4 3.7  CL 108 105 103  CO2 23 23 26   GLUCOSE 72 80 121*  BUN 10 12 13   CREATININE 0.82 0.79 1.12  CALCIUM 9.4 9.5 8.9  MG 1.7 1.6*  --   PHOS  --  3.4  --      IMAGING Ct Head Wo Contrast 05/05/2018 1. Small focus of hemorrhage in the right posterior fossa, likely intraparenchymal within the cerebellum. No associated mass effect.  2. Severe chronic ischemic microangiopathy.   MRI Brain Wo Contrast 05/06/2018 1. Subacute hematoma right cerebellum with methemoglobin formation and surrounding edema. 2. Advanced atrophy and advanced chronic microvascular ischemia 3. Enlarged pituitary 13 mm consistent with adenoma.  Ct Head Wo Contrast 05/06/2018 Stable appearance of focal hyperdensity in the right posterior fossa. This could represent a cerebellar hemorrhage or calcified meningioma. No progression.   Dg Chest 2 View 05/05/2018 Stable elevation of left hemidiaphragm. Mild bibasilar atelectasis. No edema or consolidation. Stable cardiac silhouette.   PHYSICAL EXAM per Dr. Leonie Man Frail elderly african american male not in distress. . Afebrile. Head is nontraumatic. Neck is supple without bruit.    Cardiac exam no murmur or gallop.  Lungs are clear to auscultation. Distal pulses are well felt. Neurological Exam Awake alert disoriented.  Slow to respond to questions.  Diminished attention, registration and recall.  Speaks in a hypophonic voice.  Follows simple midline and one-step commands.  Extraocular movements are full range without nystagmus.  Blinks to threat bilaterally.  Face is symmetric.  Diminished facial expression.  Positive glabellar tap.  Motor system exam reveals symmetric upper extremity strength but variable and poor effort.  Mild proximal lower extremity weakness but again effort is poor and variable.  No focal weakness.  Mild action tremor of outstretched upper extremities.  Cogwheel rigidity present bilaterally.  Gait not tested.   ASSESSMENT/PLAN [name] [name] is an 84 y.o. male with history of CAD, HTN, HLD, dementia, PE (on warfarin), advanced parkinson's disease, CHF with LVEF of 40 to 45% documented in 2016 presenting with bradycardia and found to have hemorrhage on Head CT.  He did not receive IV t-PA due to [place].  Stroke:  Small R Cerebellum ICH - non traumatic warfarin associated coagulopathy  CT head - Small focus of hemorrhage in the right posterior fossa, likely intraparenchymal within the cerebellum.  MRI head - Subacute hematoma right cerebellum with methemoglobin formation and surrounding edema.  MRA head - not performed  Carotid Doppler - not indicated  2D  Echo - not indicated  Loop recorder - now deactivated. Never showed AF. Pt opted to leave in.  LDL - 148  HgbA1c - 5.9  VTE prophylaxis - SCDs  Diet - Dysphagia 3 thin liquids  warfarin daily also on ASA 81 mg daily prior to admission, now on aspirin 81 mg daily due to hmg. Check imaging prior to restarting warfarin in about 2 weeks 0n 05/19/18  Ongoing aggressive stroke risk factor management  Therapy recommendations:  CIR. They are following. Await insurance approval  Disposition:  Pending  Medically ready for d/c  once bed available (dtr open to SNF placement if CIR not available)  PE   on warfarin at time of admission  Reversed in the ED  INR 1.17. Will stop daily checks  Check imaging prior to restarting warfarin in about 2 weeks  Hypertension  BP high as 157/127 on arrival . BP goal < 160  . BP 130-140s . On home BP medications . BP goal normotensive  Hyperlipidemia  Lipid lowering medication PTA:  Fish oil  LDL 148, goal < 70  Current lipid lowering medication: none   Resume fish oil  Consider statin at time of follow up  Other Stroke Risk Factors  Advanced age  Coronary artery disease  Other Active Problems  Parkinson's disease  Hyperkalemia resolved - (was on KCL 40 meq daily PTA)  4.7 -> 3.8  Bradycardia most likely related to PD, asymptomatic from it, continue to monitor  Sedated following late seroquel dose over the weekend, resolved. On 12.5 at home, ordered 25 here. Will decrease to home dose.  No change in condition d/c CBGs Monitor cough  Hospital day # 6  Continue the present management. Transfer to skilled nursing facility for rehabilitation when bed available. Discussed with daughter who agrees.  [name], MD Medical Director [facility] Pager: [phone] 05/11/2018 3:35 PM  To contact Stroke Continuity provider, please refer to [email or web address] After hours, contact General Neurology

## 2018-05-12 ENCOUNTER — Encounter (HOSPITAL_COMMUNITY): Payer: Self-pay

## 2018-05-12 DIAGNOSIS — R404 Transient alteration of awareness: Secondary | ICD-10-CM | POA: Diagnosis not present

## 2018-05-12 DIAGNOSIS — R159 Full incontinence of feces: Secondary | ICD-10-CM | POA: Diagnosis not present

## 2018-05-12 DIAGNOSIS — R32 Unspecified urinary incontinence: Secondary | ICD-10-CM | POA: Diagnosis not present

## 2018-05-12 DIAGNOSIS — R062 Wheezing: Secondary | ICD-10-CM | POA: Diagnosis not present

## 2018-05-12 DIAGNOSIS — Z7901 Long term (current) use of anticoagulants: Secondary | ICD-10-CM | POA: Diagnosis not present

## 2018-05-12 DIAGNOSIS — M2550 Pain in unspecified joint: Secondary | ICD-10-CM | POA: Diagnosis not present

## 2018-05-12 DIAGNOSIS — Z5181 Encounter for therapeutic drug level monitoring: Secondary | ICD-10-CM | POA: Diagnosis not present

## 2018-05-12 DIAGNOSIS — J690 Pneumonitis due to inhalation of food and vomit: Secondary | ICD-10-CM | POA: Diagnosis not present

## 2018-05-12 DIAGNOSIS — I2699 Other pulmonary embolism without acute cor pulmonale: Secondary | ICD-10-CM | POA: Diagnosis not present

## 2018-05-12 DIAGNOSIS — I2510 Atherosclerotic heart disease of native coronary artery without angina pectoris: Secondary | ICD-10-CM | POA: Diagnosis not present

## 2018-05-12 DIAGNOSIS — E875 Hyperkalemia: Secondary | ICD-10-CM | POA: Diagnosis not present

## 2018-05-12 DIAGNOSIS — I639 Cerebral infarction, unspecified: Secondary | ICD-10-CM | POA: Diagnosis not present

## 2018-05-12 DIAGNOSIS — R531 Weakness: Secondary | ICD-10-CM | POA: Diagnosis not present

## 2018-05-12 DIAGNOSIS — F0280 Dementia in other diseases classified elsewhere without behavioral disturbance: Secondary | ICD-10-CM | POA: Diagnosis not present

## 2018-05-12 DIAGNOSIS — I4510 Unspecified right bundle-branch block: Secondary | ICD-10-CM | POA: Diagnosis not present

## 2018-05-12 DIAGNOSIS — R5381 Other malaise: Secondary | ICD-10-CM | POA: Diagnosis not present

## 2018-05-12 DIAGNOSIS — Z7401 Bed confinement status: Secondary | ICD-10-CM | POA: Diagnosis not present

## 2018-05-12 DIAGNOSIS — Z8546 Personal history of malignant neoplasm of prostate: Secondary | ICD-10-CM | POA: Diagnosis not present

## 2018-05-12 DIAGNOSIS — I110 Hypertensive heart disease with heart failure: Secondary | ICD-10-CM | POA: Diagnosis not present

## 2018-05-12 DIAGNOSIS — R278 Other lack of coordination: Secondary | ICD-10-CM | POA: Diagnosis not present

## 2018-05-12 DIAGNOSIS — I69291 Dysphagia following other nontraumatic intracranial hemorrhage: Secondary | ICD-10-CM | POA: Diagnosis not present

## 2018-05-12 DIAGNOSIS — M6281 Muscle weakness (generalized): Secondary | ICD-10-CM | POA: Diagnosis not present

## 2018-05-12 DIAGNOSIS — R2681 Unsteadiness on feet: Secondary | ICD-10-CM | POA: Diagnosis not present

## 2018-05-12 DIAGNOSIS — R279 Unspecified lack of coordination: Secondary | ICD-10-CM | POA: Diagnosis not present

## 2018-05-12 DIAGNOSIS — R269 Unspecified abnormalities of gait and mobility: Secondary | ICD-10-CM | POA: Diagnosis not present

## 2018-05-12 DIAGNOSIS — I6389 Other cerebral infarction: Secondary | ICD-10-CM | POA: Diagnosis not present

## 2018-05-12 DIAGNOSIS — Z79899 Other long term (current) drug therapy: Secondary | ICD-10-CM | POA: Diagnosis not present

## 2018-05-12 DIAGNOSIS — I610 Nontraumatic intracerebral hemorrhage in hemisphere, subcortical: Secondary | ICD-10-CM | POA: Diagnosis not present

## 2018-05-12 DIAGNOSIS — R001 Bradycardia, unspecified: Secondary | ICD-10-CM | POA: Diagnosis not present

## 2018-05-12 DIAGNOSIS — G20 Parkinson's disease: Secondary | ICD-10-CM | POA: Diagnosis not present

## 2018-05-12 DIAGNOSIS — R000 Tachycardia, unspecified: Secondary | ICD-10-CM | POA: Diagnosis not present

## 2018-05-12 DIAGNOSIS — M1A00X0 Idiopathic chronic gout, unspecified site, without tophus (tophi): Secondary | ICD-10-CM | POA: Diagnosis not present

## 2018-05-12 DIAGNOSIS — I614 Nontraumatic intracerebral hemorrhage in cerebellum: Secondary | ICD-10-CM | POA: Diagnosis not present

## 2018-05-12 DIAGNOSIS — I619 Nontraumatic intracerebral hemorrhage, unspecified: Secondary | ICD-10-CM | POA: Diagnosis not present

## 2018-05-12 DIAGNOSIS — R05 Cough: Secondary | ICD-10-CM | POA: Diagnosis not present

## 2018-05-12 DIAGNOSIS — R2689 Other abnormalities of gait and mobility: Secondary | ICD-10-CM | POA: Diagnosis not present

## 2018-05-12 DIAGNOSIS — Z515 Encounter for palliative care: Secondary | ICD-10-CM | POA: Diagnosis not present

## 2018-05-12 DIAGNOSIS — R0602 Shortness of breath: Secondary | ICD-10-CM | POA: Diagnosis not present

## 2018-05-12 DIAGNOSIS — I10 Essential (primary) hypertension: Secondary | ICD-10-CM | POA: Diagnosis not present

## 2018-05-12 DIAGNOSIS — D649 Anemia, unspecified: Secondary | ICD-10-CM | POA: Diagnosis not present

## 2018-05-12 DIAGNOSIS — D179 Benign lipomatous neoplasm, unspecified: Secondary | ICD-10-CM | POA: Diagnosis not present

## 2018-05-12 DIAGNOSIS — G309 Alzheimer's disease, unspecified: Secondary | ICD-10-CM | POA: Diagnosis not present

## 2018-05-12 DIAGNOSIS — R488 Other symbolic dysfunctions: Secondary | ICD-10-CM | POA: Diagnosis not present

## 2018-05-12 MED ORDER — WARFARIN SODIUM 5 MG PO TABS: 2.5000 mg | ORAL_TABLET | ORAL | Status: DC

## 2018-05-12 MED ORDER — ATORVASTATIN CALCIUM 10 MG PO TABS
10.0000 mg | ORAL_TABLET | Freq: Every day | ORAL | Status: DC
  Administered 2018-05-12: 10 mg via ORAL
  Filled 2018-05-12: qty 1

## 2018-05-12 MED ORDER — QUETIAPINE FUMARATE 25 MG PO TABS: 12.5000 mg | ORAL_TABLET | Freq: Every day | ORAL | 2 refills | Status: DC

## 2018-05-12 MED ORDER — AMLODIPINE BESYLATE 2.5 MG PO TABS
2.5000 mg | ORAL_TABLET | Freq: Every day | ORAL | Status: DC
  Administered 2018-05-12: 2.5 mg via ORAL
  Filled 2018-05-12: qty 1

## 2018-05-12 MED ORDER — ASPIRIN 81 MG PO CHEW: 81.0000 mg | CHEWABLE_TABLET | Freq: Every day | ORAL | Status: AC | PRN

## 2018-05-12 MED ORDER — ATORVASTATIN CALCIUM 10 MG PO TABS: 10.0000 mg | ORAL_TABLET | Freq: Every day | ORAL | Status: DC

## 2018-05-12 NOTE — Clinical Social Work Placement (Signed)
Nurse to call report to [phone], Room [place]  NOTE  Date:  05/12/2018  Patient Details  Name: [name] [name] MRN: [id number] Date of Birth: 10/21/1930  Clinical Social Work is seeking post-discharge placement for this patient at the [place] level of care (*CSW will initial, date and re-position this form in  chart as items are completed):  Yes   Patient/family provided with [place] Work Department's list of facilities offering this level of care within the geographic area requested by the patient (or if unable, by the patient's family).  Yes   Patient/family informed of their freedom to choose among providers that offer the needed level of care, that participate in Medicare, Medicaid or managed care program needed by the patient, have an available bed and are willing to accept the patient.  Yes   Patient/family informed of [facility]'s ownership interest in [facility] and [facility], as well as of the fact that they are under no obligation to receive care at these facilities.  PASRR submitted to EDS on       PASRR number received on       Existing PASRR number confirmed on       FL2 transmitted to all facilities in geographic area requested by pt/family on       FL2 transmitted to all facilities within larger geographic area on       Patient informed that his/her managed care company has contracts with or will negotiate with certain facilities, including the following:        Yes   Patient/family informed of bed offers received.  Patient chooses bed at [facility]     Physician recommends and patient chooses bed at      Patient to be transferred to [facility] on 05/12/18.  Patient to be transferred to facility by [facility]     Patient family notified on 05/12/18 of transfer.  Name of family member notified:  [name]     PHYSICIAN       Additional Comment:     _______________________________________________ Geralynn Ochs, LCSW 05/12/2018, 3:44 PM

## 2018-05-12 NOTE — Progress Notes (Signed)
Patient discharging to [place] All belongings sent with the family. Nurse called report

## 2018-05-12 NOTE — Progress Notes (Signed)
STROKE TEAM PROGRESS NOTE   SUBJECTIVE (INTERVAL HISTORY) His   Home health aideis at the bedside. Awaiting insurance approval for SNF.  [name] No changes   OBJECTIVE Vitals:   05/12/18 0015 05/12/18 0411 05/12/18 0422 05/12/18 0857  BP: (!) 152/110  (!) 153/112 (!) 141/91  Pulse: 74 76 68 67  Resp: 16 18 17 18   Temp: 97.6 F (36.4 C)  (!) 97.4 F (36.3 C) 97.6 F (36.4 C)  TempSrc: Oral  Oral Axillary  SpO2: 98% 99% 94% 97%  Weight:      Height:        CBC:  Recent Labs  Lab 05/05/18 1556 05/06/18 0622 05/07/18 0822  WBC 4.8 4.7 3.8*  NEUTROABS 3.5  --   --   HGB 13.3 12.0* 12.1*  HCT 42.9 37.7* 38.2*  MCV 100.2* 96.9 97.7  PLT 196 158 242    Basic Metabolic Panel:  Recent Labs  Lab 05/05/18 1945 05/06/18 0305 05/07/18 0822  NA 139 140 137  K 4.3 4.4 3.7  CL 108 105 103  CO2 23 23 26   GLUCOSE 72 80 121*  BUN 10 12 13   CREATININE 0.82 0.79 1.12  CALCIUM 9.4 9.5 8.9  MG 1.7 1.6*  --   PHOS  --  3.4  --      IMAGING Ct Head Wo Contrast 05/05/2018 1. Small focus of hemorrhage in the right posterior fossa, likely intraparenchymal within the cerebellum. No associated mass effect.  2. Severe chronic ischemic microangiopathy.   MRI Brain Wo Contrast 05/06/2018 1. Subacute hematoma right cerebellum with methemoglobin formation and surrounding edema. 2. Advanced atrophy and advanced chronic microvascular ischemia 3. Enlarged pituitary 13 mm consistent with adenoma.  Ct Head Wo Contrast 05/06/2018 Stable appearance of focal hyperdensity in the right posterior fossa. This could represent a cerebellar hemorrhage or calcified meningioma. No progression.   Dg Chest 2 View 05/05/2018 Stable elevation of left hemidiaphragm. Mild bibasilar atelectasis. No edema or consolidation. Stable cardiac silhouette.   PHYSICAL EXAM per Dr. Leonie Man Frail elderly african american male not in distress. . Afebrile. Head is nontraumatic. Neck is supple without bruit.    Cardiac exam  no murmur or gallop. Lungs are clear to auscultation. Distal pulses are well felt. Neurological Exam Awake alert disoriented.  Slow to respond to questions.  Diminished attention, registration and recall.  Speaks in a hypophonic voice.  Follows simple midline and one-step commands.  Extraocular movements are full range without nystagmus.  Blinks to threat bilaterally.  Face is symmetric.  Diminished facial expression.  Positive glabellar tap.  Motor system exam reveals symmetric upper extremity strength but variable and poor effort.  Mild proximal lower extremity weakness but again effort is poor and variable.  No focal weakness.  Mild action tremor of outstretched upper extremities.  Cogwheel rigidity present bilaterally.  Gait not tested.   ASSESSMENT/PLAN [name] [name] is an 84 y.o. male with history of CAD, HTN, HLD, dementia, PE (on warfarin), advanced parkinson's disease, CHF with LVEF of 40 to 45% documented in 2016 presenting with bradycardia and found to have hemorrhage on Head CT.  He did not receive IV t-PA due to [place].  Stroke:  Small R Cerebellum ICH - non traumatic warfarin associated coagulopathy  CT head - Small focus of hemorrhage in the right posterior fossa, likely intraparenchymal within the cerebellum.  MRI head - Subacute hematoma right cerebellum with methemoglobin formation and surrounding edema.  MRA head - not performed  Carotid Doppler -  not indicated  2D Echo - not indicated  Loop recorder - now deactivated. Never showed AF. Pt opted to leave in.  LDL - 148  HgbA1c - 5.9  VTE prophylaxis - SCDs  Diet - Dysphagia 3 thin liquids  warfarin daily also on ASA 81 mg daily prior to admission, now on aspirin 81 mg daily due to hmg. Check imaging prior to restarting warfarin in about 2 weeks 0n 05/19/18  Ongoing aggressive stroke risk factor management  Therapy recommendations:  SNF They are following. Await insurance approval  Disposition:   Pending  Medically ready for d/c once bed available (dtr open to SNF placement if CIR not available)  PE   on warfarin at time of admission  Reversed in the ED  INR 1.17. Will stop daily checks  Check imaging prior to restarting warfarin in about 2 weeks  Hypertension  BP high as 157/127 on arrival . BP goal < 160  . BP 130-140s . On home BP medications . BP goal normotensive  Hyperlipidemia  Lipid lowering medication PTA:  Fish oil  LDL 148, goal < 70  Current lipid lowering medication: none   Resume fish oil  Consider statin at time of follow up  Other Stroke Risk Factors  Advanced age  Coronary artery disease  Other Active Problems  Parkinson's disease  Hyperkalemia resolved - (was on KCL 40 meq daily PTA)  4.7 -> 3.8  Bradycardia most likely related to PD, asymptomatic from it, continue to monitor  Sedated following late seroquel dose over the weekend, resolved. On 12.5 at home, ordered 25 here. Will decrease to home dose.  No change in condition d/c CBGs Monitor cough  Hospital day # 7  Continue the present management. Transfer to skilled nursing facility for rehabilitation when bed available. Discussed with home health aide   [name], MD Medical Director [place] Pager: [phone] 05/12/2018 12:45 PM  To contact Stroke Continuity provider, please refer to [email or web address] After hours, contact General Neurology

## 2018-05-12 NOTE — Progress Notes (Signed)
Physical Therapy Treatment Patient Details Name: [name] [name] MRN: [id number] DOB: Apr 23, 1931 Today's Date: 05/12/2018    History of Present Illness 84 yo male with onset of a fall at home which was protected now has a cerebellar hematoma, but has been on blood thinners.  Admitted with bradycardia, low BS, low level of responsiveness at baseline and now.  PMHx:  CAD, HTN, dementia, PE, advanced parkinson's, CHF with EF 40-45% per last report,     PT Comments    Pt with eyes closed throughout session. Occasional moan in response to questions but otherwise non verbal and unable to follow any commands. Pt resistive to ROM and bed mobilities. Performed PROM and educated caregiver present on the importance of continuing to mobilize pt. Unable to come EOB today. Will continue to follow acutely.    Follow Up Recommendations  SNF     Equipment Recommendations  None recommended by PT    Recommendations for Other Services Rehab consult     Precautions / Restrictions Precautions Precautions: Fall Restrictions Weight Bearing Restrictions: No    Mobility  Bed Mobility Overal bed mobility: Needs Assistance             General bed mobility comments: pt resistive to attempts to sit up. Deffered getting EOB for pt and therapist safety, as pt was already pushing into extension.  Transfers                 General transfer comment: unable to attempt  Ambulation/Gait             General Gait Details: unable(PLOF was 2 HHA due to pt releasing walker with hands)   Stairs             Wheelchair Mobility    Modified Rankin (Stroke Patients Only) Modified Rankin (Stroke Patients Only) Pre-Morbid Rankin Score: Moderate disability Modified Rankin: Severe disability     Balance Overall balance assessment: History of Falls;Needs assistance Sitting-balance support: Feet supported;Bilateral upper extremity supported Sitting balance-Leahy Scale: Zero        Standing balance-Leahy Scale: Zero                              Cognition Arousal/Alertness: Lethargic Behavior During Therapy: Flat affect Overall Cognitive Status: History of cognitive impairments - at baseline                                 General Comments: Pt with eyes closed and non verbal throughout session. Caregiver reports he is typically more talkitive with eyes opened.      Exercises General Exercises - Upper Extremity Shoulder Flexion: PROM;Both;5 reps;Supine Shoulder ABduction: PROM;Both;5 reps;Supine Shoulder ADduction: PROM;Both;5 reps;Supine Elbow Flexion: PROM;Both;5 reps;Supine Elbow Extension: PROM;Both;5 reps;Supine Composite Extension: PROM;Both;5 reps;Supine General Exercises - Lower Extremity Ankle Circles/Pumps: PROM;Both;5 reps;Supine Heel Slides: PROM;Both;5 reps;Supine Other Exercises Other Exercises: Caregiver present and educated on the importance of performing PROM on pt to avoid contractures and maintain mobility.    General Comments        Pertinent Vitals/Pain Pain Assessment: Faces Faces Pain Scale: Hurts little more    Home Living                      Prior Function            PT Goals (current goals can now be found in the care plan  section) Acute Rehab PT Goals Patient Stated Goal: return to prior level of mobility, self feed PT Goal Formulation: With family Time For Goal Achievement: 05/22/18 Potential to Achieve Goals: Good Progress towards PT goals: Not progressing toward goals - comment(Pt lethargy limiting progression.)    Frequency    Min 3X/week      PT Plan Current plan remains appropriate    Co-evaluation              AM-PAC PT "6 Clicks" Daily Activity  Outcome Measure  Difficulty turning over in bed (including adjusting bedclothes, sheets and blankets)?: Unable Difficulty moving from lying on back to sitting on the side of the bed? : Unable Difficulty sitting  down on and standing up from a chair with arms (e.g., wheelchair, bedside commode, etc,.)?: Unable Help needed moving to and from a bed to chair (including a wheelchair)?: Total Help needed walking in hospital room?: Total Help needed climbing 3-5 steps with a railing? : Total 6 Click Score: 6    End of Session   Activity Tolerance: Patient limited by lethargy(resistive) Patient left: in bed;with call bell/phone within reach;with family/visitor present Nurse Communication: Mobility status PT Visit Diagnosis: Unsteadiness on feet (R26.81);Muscle weakness (generalized) (M62.81);History of falling (Z91.81);Adult, failure to thrive (R62.7)     Time: 1146-1209 PT Time Calculation (min) (ACUTE ONLY): 23 min  Charges:  $Therapeutic Activity: 8-22 mins                     Benjiman Core, [place] Pager [phone] Acute Rehab   Allena Katz 05/12/2018, 12:39 PM

## 2018-05-12 NOTE — Progress Notes (Signed)
Occupational Therapy Treatment Patient Details Name: [name] [name] MRN: [id number] DOB: 12/14/1930 Today's Date: 05/12/2018    History of present illness 84 yo male with onset of a fall at home which was protected now has a cerebellar hematoma, but has been on blood thinners.  Admitted with bradycardia, low BS, low level of responsiveness at baseline and now.  PMHx:  CAD, HTN, dementia, PE, advanced parkinson's, CHF with EF 40-45% per last report,    OT comments  Pt not progressing towards OT goals today due to eyes shut throughout the session, Attempts to get Pt to self-feed were unsuccessful as he would hold therapists hands..but would not hold spoon. Attempted to get him to hold cup and drink and he would not hold cup.- however he would take small sips from the straw. Also perform PROM on all 4 extremities this session - Pt grimacing and very tight/resistive to movement. Deferred transfer due to strong truncal extension and safety for Pt and therapists sitting EOB/chair transfer.   Follow Up Recommendations  Supervision/Assistance - 24 hour    Equipment Recommendations  None recommended by OT    Recommendations for Other Services      Precautions / Restrictions Precautions Precautions: Fall Restrictions Weight Bearing Restrictions: No       Mobility Bed Mobility Overal bed mobility: Needs Assistance             General bed mobility comments: pt resistive to attempts to sit up. Deffered getting EOB for pt and therapist safety, as pt was already pushing into extension.  Transfers                 General transfer comment: unable to attempt    Balance Overall balance assessment: History of Falls;Needs assistance Sitting-balance support: Feet supported;Bilateral upper extremity supported Sitting balance-Leahy Scale: Zero       Standing balance-Leahy Scale: Zero                             ADL either performed or assessed with clinical judgement    ADL Overall ADL's : Needs assistance/impaired                                       General ADL Comments: Pt continues to require total A. Attempted feeding, however Pt pursing lips and refusing. Eventually took sips from straw from caregiver (caregiver holding cup)     Vision       Perception     Praxis      Cognition Arousal/Alertness: Lethargic Behavior During Therapy: Flat affect Overall Cognitive Status: History of cognitive impairments - at baseline                                 General Comments: Pt with eyes closed and non verbal throughout session. Caregiver reports he is typically more talkitive with eyes opened.        Exercises Exercises: Other exercises;General Upper Extremity;General Lower Extremity General Exercises - Upper Extremity Shoulder Flexion: PROM;Both;5 reps;Supine Shoulder ABduction: PROM;Both;5 reps;Supine Shoulder ADduction: PROM;Both;5 reps;Supine Elbow Flexion: PROM;Both;5 reps;Supine Elbow Extension: PROM;Both;5 reps;Supine Composite Extension: PROM;Both;5 reps;Supine General Exercises - Lower Extremity Ankle Circles/Pumps: PROM;Both;5 reps;Supine Heel Slides: PROM;Both;5 reps;Supine Other Exercises Other Exercises: Caregiver Caren Griffins present and educated on the importance of performing PROM on pt to  avoid contractures and maintain mobility.   Shoulder Instructions       General Comments      Pertinent Vitals/ Pain       Pain Assessment: Faces Faces Pain Scale: Hurts little more Pain Location: generalized with PROM of arms and legs Pain Descriptors / Indicators: Grimacing Pain Intervention(s): Limited activity within patient's tolerance;Monitored during session  Home Living                                          Prior Functioning/Environment              Frequency  Min 2X/week        Progress Toward Goals  OT Goals(current goals can now be found in the care plan  section)  Progress towards OT goals: Not progressing toward goals - comment(limited interaction today throughout session)  Acute Rehab OT Goals Patient Stated Goal: return to prior level of mobility, self feed  Plan Discharge plan remains appropriate;Frequency remains appropriate    Co-evaluation    PT/OT/SLP Co-Evaluation/Treatment: Yes Reason for Co-Treatment: Complexity of the patient's impairments (multi-system involvement);For patient/therapist safety;To address functional/ADL transfers PT goals addressed during session: Strengthening/ROM;Mobility/safety with mobility OT goals addressed during session: ADL's and self-care;Strengthening/ROM      AM-PAC PT "6 Clicks" Daily Activity     Outcome Measure   Help from another person eating meals?: Total Help from another person taking care of personal grooming?: Total Help from another person toileting, which includes using toliet, bedpan, or urinal?: Total Help from another person bathing (including washing, rinsing, drying)?: Total Help from another person to put on and taking off regular upper body clothing?: Total Help from another person to put on and taking off regular lower body clothing?: Total 6 Click Score: 6    End of Session    OT Visit Diagnosis: Unsteadiness on feet (R26.81);Other abnormalities of gait and mobility (R26.89);Cognitive communication deficit (R41.841)   Activity Tolerance Patient limited by lethargy   Patient Left in bed;with call bell/phone within reach;with bed alarm set;with family/visitor present;with SCD's reapplied   Nurse Communication Mobility status        Time: 3299-2426 OT Time Calculation (min): 23 min  Charges: OT General Charges $OT Visit: 1 Visit OT Treatments $Self Care/Home Management : 8-22 mins  Hulda Humphrey OTR/L [place] 05/12/2018, 5:23 PM

## 2018-05-15 ENCOUNTER — Ambulatory Visit: Payer: Medicare HMO

## 2018-05-16 DIAGNOSIS — I2699 Other pulmonary embolism without acute cor pulmonale: Secondary | ICD-10-CM | POA: Diagnosis not present

## 2018-05-16 DIAGNOSIS — E875 Hyperkalemia: Secondary | ICD-10-CM | POA: Diagnosis not present

## 2018-05-16 DIAGNOSIS — G309 Alzheimer's disease, unspecified: Secondary | ICD-10-CM | POA: Diagnosis not present

## 2018-05-16 DIAGNOSIS — I6389 Other cerebral infarction: Secondary | ICD-10-CM | POA: Diagnosis not present

## 2018-05-19 DIAGNOSIS — G309 Alzheimer's disease, unspecified: Secondary | ICD-10-CM | POA: Diagnosis not present

## 2018-05-19 DIAGNOSIS — G20 Parkinson's disease: Secondary | ICD-10-CM | POA: Diagnosis not present

## 2018-05-19 DIAGNOSIS — I6389 Other cerebral infarction: Secondary | ICD-10-CM | POA: Diagnosis not present

## 2018-05-19 DIAGNOSIS — I2699 Other pulmonary embolism without acute cor pulmonale: Secondary | ICD-10-CM | POA: Diagnosis not present

## 2018-05-22 DIAGNOSIS — G20 Parkinson's disease: Secondary | ICD-10-CM | POA: Diagnosis not present

## 2018-05-22 DIAGNOSIS — I6389 Other cerebral infarction: Secondary | ICD-10-CM | POA: Diagnosis not present

## 2018-05-22 DIAGNOSIS — G309 Alzheimer's disease, unspecified: Secondary | ICD-10-CM | POA: Diagnosis not present

## 2018-05-22 DIAGNOSIS — D179 Benign lipomatous neoplasm, unspecified: Secondary | ICD-10-CM | POA: Diagnosis not present

## 2018-05-25 DIAGNOSIS — I6389 Other cerebral infarction: Secondary | ICD-10-CM | POA: Diagnosis not present

## 2018-05-25 DIAGNOSIS — I2699 Other pulmonary embolism without acute cor pulmonale: Secondary | ICD-10-CM | POA: Diagnosis not present

## 2018-05-26 ENCOUNTER — Encounter (HOSPITAL_COMMUNITY): Payer: Self-pay | Admitting: Emergency Medicine

## 2018-05-26 ENCOUNTER — Emergency Department (HOSPITAL_COMMUNITY): Payer: Medicare Other

## 2018-05-26 ENCOUNTER — Other Ambulatory Visit: Payer: Self-pay

## 2018-05-26 ENCOUNTER — Emergency Department (HOSPITAL_COMMUNITY)
Admission: EM | Admit: 2018-05-26 | Discharge: 2018-05-26 | Disposition: A | Discharge status: Home / Self Care | Payer: Medicare Other | Attending: Emergency Medicine | Admitting: Emergency Medicine

## 2018-05-26 DIAGNOSIS — Z8546 Personal history of malignant neoplasm of prostate: Secondary | ICD-10-CM | POA: Insufficient documentation

## 2018-05-26 DIAGNOSIS — I2510 Atherosclerotic heart disease of native coronary artery without angina pectoris: Secondary | ICD-10-CM | POA: Insufficient documentation

## 2018-05-26 DIAGNOSIS — Z5181 Encounter for therapeutic drug level monitoring: Secondary | ICD-10-CM | POA: Diagnosis not present

## 2018-05-26 DIAGNOSIS — I10 Essential (primary) hypertension: Secondary | ICD-10-CM | POA: Diagnosis not present

## 2018-05-26 DIAGNOSIS — G20 Parkinson's disease: Secondary | ICD-10-CM | POA: Diagnosis not present

## 2018-05-26 DIAGNOSIS — R05 Cough: Secondary | ICD-10-CM

## 2018-05-26 DIAGNOSIS — R0602 Shortness of breath: Secondary | ICD-10-CM | POA: Diagnosis not present

## 2018-05-26 DIAGNOSIS — J690 Pneumonitis due to inhalation of food and vomit: Secondary | ICD-10-CM | POA: Diagnosis not present

## 2018-05-26 DIAGNOSIS — Z79899 Other long term (current) drug therapy: Secondary | ICD-10-CM | POA: Diagnosis not present

## 2018-05-26 DIAGNOSIS — I2699 Other pulmonary embolism without acute cor pulmonale: Secondary | ICD-10-CM | POA: Diagnosis not present

## 2018-05-26 DIAGNOSIS — R059 Cough, unspecified: Secondary | ICD-10-CM

## 2018-05-26 DIAGNOSIS — R062 Wheezing: Secondary | ICD-10-CM | POA: Diagnosis not present

## 2018-05-26 DIAGNOSIS — G309 Alzheimer's disease, unspecified: Secondary | ICD-10-CM | POA: Diagnosis not present

## 2018-05-26 LAB — BASIC METABOLIC PANEL
Anion gap: 9 (ref 5–15)
BUN: 8 mg/dL (ref 8–23)
CALCIUM: 9.4 mg/dL (ref 8.9–10.3)
CHLORIDE: 103 mmol/L (ref 98–111)
CO2: 27 mmol/L (ref 22–32)
Creatinine, Ser: 0.88 mg/dL (ref 0.61–1.24)
GFR calc non Af Amer: >60 mL/min (ref >60)
Glucose, Bld: 96 mg/dL (ref 70–99)
Potassium: 4.5 mmol/L (ref 3.5–5.1)
SODIUM: 139 mmol/L (ref 135–145)

## 2018-05-26 LAB — CBC
HCT: 41.3 % (ref 39.0–52.0)
Hemoglobin: 13 g/dL (ref 13.0–17.0)
MCH: 30.6 pg (ref 26.0–34.0)
MCHC: 31.5 g/dL (ref 30.0–36.0)
MCV: 97.2 fL (ref 78.0–100.0)
PLATELETS: 249 10*3/uL (ref 150–400)
RBC: 4.25 MIL/uL (ref 4.22–5.81)
RDW: 14.6 % (ref 11.5–15.5)
WBC: 5.1 10*3/uL (ref 4.0–10.5)

## 2018-05-26 LAB — I-STAT TROPONIN, ED: TROPONIN I, POC: 0.01 ng/mL (ref 0.00–0.08)

## 2018-05-26 LAB — PROTIME-INR
INR: 1.5
PROTHROMBIN TIME: 18 s — AB (ref 11.4–15.2)

## 2018-05-26 LAB — BRAIN NATRIURETIC PEPTIDE: B Natriuretic Peptide: 51 pg/mL (ref 0.0–100.0)

## 2018-05-26 MED ORDER — LEVOFLOXACIN 25 MG/ML PO SOLN
750.0000 mg | Freq: Every day | ORAL | Status: DC
  Administered 2018-05-26: 750 mg via ORAL
  Filled 2018-05-26: qty 30

## 2018-05-26 MED ORDER — LEVOFLOXACIN 25 MG/ML PO SOLN: 750.0000 mg | Freq: Every day | ORAL | 0 refills | Status: AC

## 2018-05-26 NOTE — ED Provider Notes (Signed)
[facility] EMERGENCY DEPARTMENT Provider Note   CSN: [id number] Arrival date & time: 05/26/18  0327     History   Chief Complaint Chief Complaint  Patient presents with  . Cough    HPI [name] [name] is a 84 y.o. male with a hx of artery disease, dementia, GERD, hypertension, pulmonary embolism, hemorrhagic stroke presents to the Emergency Department complaining of new onset cough tonight around 8 PM.  Level 5 caveat for dementia and largely nonverbal, answering only yes and no questions.  History is provided by patient's daughter.  She reports that her father has had more difficulty with dysphasia after his stroke.  She reports he is on a dysphasia 3 diet but has not been using thickened liquids.  She reports that tonight, the CNA at the rehabilitation facility reported some choking with drinking.  She reports at 8 PM she noticed her father was coughing more than usual.  She also reports wheezing.  Patient was given albuterol in route to the emergency department which cleared his wheezing.  He continues to have some mild cough.  Initially, patient's daughter reports concerned that his INR continues to be subtherapeutic.  Record review shows that patient was to resume Coumadin on 05/19/2018 at 2 weeks post stroke.  Patient's daughter reports that he did this as directed.  Unknown last INR.   The history is provided by the patient, medical records and a relative. No language interpreter was used.    Past Medical History:  Diagnosis Date  . Anxiety   . Coronary artery disease, non-occlusive    with history of MI; Cath 2008 w multivessel nonobstructive CAD  . Dementia   . Depression   . GERD (gastroesophageal reflux disease)   . Hyperlipidemia   . Hypertension   . Memory loss   . Parkinson disease ([place])   . PE (pulmonary embolism)    unprovoked PE completed 6 months of warfarin, warfarin d/ced 02/12/2010, repeat PE 07/10/2010 after a long car ride and now on  lifelong coumadin.  . Pituitary microadenoma ([place])    incidental finding CT 12/2009  . Prostate cancer ([place])   . Scoliosis   . Sigmoid volvulus ([place]) 08/02/2016  . Volvulus of colon ([place]) 06/01/2017    Patient Active Problem List   Diagnosis Date Noted  . Hemorrhagic stroke ([place])   . Prediabetes   . Dysphagia, post-stroke   . Benign essential HTN   . Parkinson disease ([place])   . Anemia of chronic disease   . ICH (intracerebral hemorrhage) ([place]) 05/05/2018  . Hyperkalemia 05/05/2018  . Chalazion left upper eyelid 04/11/2018  . Lethargy 11/10/2017  . Fatigue 09/08/2017  . Need for immunization against influenza 09/08/2017  . Recurrent UTI 06/27/2017  . Pressure sore on buttocks 06/27/2017  . Generalized abdominal pain   . Volvulus of sigmoid colon ([place]) 05/31/2017  . Aortic atherosclerosis ([place]) 05/31/2017  . Loss of weight 09/23/2016  . Abdominal distension   . Long term current use of anticoagulant   . Right bundle branch block   . Hypokalemia 08/02/2016  . Diverticulosis 07/15/2016  . Statin myopathy 03/15/2016  . Parkinsonian syndrome ([place]) 08/11/2015  . Normocytic anemia, not due to blood loss 04/16/2015  . Chronic systolic heart failure ([place]) 04/16/2015  . Falls frequently 04/16/2015  . Onychomycosis 04/15/2015  . CAD (coronary artery disease) 01/16/2015  . 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 Healthcare maintenance 10/10/2014  . Lumbosacral spondylosis without myelopathy 09/24/2013  . Hypernatremia 02/20/2013  . Bradycardia  02/20/2013  . Generalized ischemic cerebrovascular disease 02/05/2013  . Depression 01/25/2013  . Insomnia 01/20/2012  . Alzheimer's dementia 08/17/2011  . History of pulmonary embolism 09/18/2010  . Encounter for long-term use of antiplatelets/antithrombotics 09/18/2010  . DJD (degenerative joint disease) 08/06/2010  . ADENOCARCINOMA, PROSTATE, HX OF 08/06/2010  . HLD (hyperlipidemia) 10/15/2009  . Essential hypertension, benign 10/15/2009  .  GERD 10/15/2009    Past Surgical History:  Procedure Laterality Date  . CATARACT EXTRACTION    . COLONOSCOPY N/A 05/30/2017   Procedure: COLONOSCOPY;  Surgeon: Ronald Lobo, MD;  Location: [facility] ENDOSCOPY;  Service: Endoscopy;  Laterality: N/A;  . FLEXIBLE SIGMOIDOSCOPY N/A 08/02/2016   Procedure: FLEXIBLE SIGMOIDOSCOPY;  Surgeon: Ronald Lobo, MD;  Location: [facility] ENDOSCOPY;  Service: Endoscopy;  Laterality: N/A;  . FLEXIBLE SIGMOIDOSCOPY N/A 08/06/2016   Procedure: FLEXIBLE SIGMOIDOSCOPY;  Surgeon: Ronald Lobo, MD;  Location: [facility] ENDOSCOPY;  Service: Endoscopy;  Laterality: N/A;  . FLEXIBLE SIGMOIDOSCOPY Left 06/02/2017   Procedure: FLEXIBLE SIGMOIDOSCOPY;  Surgeon: Arta Silence, MD;  Location: [facility] ENDOSCOPY;  Service: Endoscopy;  Laterality: Left;  . FLEXIBLE SIGMOIDOSCOPY N/A 06/07/2017   Procedure: FLEXIBLE SIGMOIDOSCOPY for possible decompression;  Surgeon: Otis Brace, MD;  Location: [facility];  Service: Gastroenterology;  Laterality: N/A;  . LEFT HEART CATHETERIZATION WITH CORONARY ANGIOGRAM N/A 12/10/2014   Procedure: LEFT HEART CATHETERIZATION WITH CORONARY ANGIOGRAM;  Surgeon: [name], MD;  Location: [facility] CATH LAB;  Service: Cardiovascular;  Laterality: N/A;  . LOOP RECORDER IMPLANT N/A 12/16/2014   Procedure: LOOP RECORDER IMPLANT;  Surgeon: [name], MD;  Location: [facility] CATH LAB;  Service: Cardiovascular;  Laterality: N/A;  . PARTIAL COLECTOMY N/A 06/08/2017   Procedure: SIGMOID  COLECTOMY;  Surgeon: Georganna Skeans, MD;  Location: [facility];  Service: General;  Laterality: N/A;  . PROSTATECTOMY          Home Medications    Prior to Admission medications   Medication Sig Start Date End Date Taking? Authorizing Provider  acetaminophen (TYLENOL) 500 MG tablet Take 1,000 mg by mouth 2 (two) times daily.    Yes [provider]  amLODipine (NORVASC) 2.5 MG tablet Take 1 tablet (2.5 mg total) by mouth daily. 11/17/17  Yes Ledell Noss, MD  atorvastatin  (LIPITOR) 10 MG tablet Take 1 tablet (10 mg total) by mouth daily at 6 PM. 05/12/18  Yes Donzetta Starch, NP  b complex vitamins tablet Take 1 tablet by mouth daily.    Yes [provider]  Ca Phosphate-Cholecalciferol (CALCIUM/VITAMIN D3 GUMMIES) 250-350 MG-UNIT CHEW Chew 1 Dose by mouth 2 (two) times daily. Patient taking differently: Chew 1 tablet by mouth 2 (two) times daily.  07/08/16  Yes [name], DO  carbidopa-levodopa (SINEMET IR) 25-100 MG tablet Take 1 tablet by mouth 4 (four) times daily. 05/02/18  Yes Garvin Fila, MD  fish oil-omega-3 fatty acids 1000 MG capsule Take 2 g by mouth daily.    Yes [provider]  loratadine (CLARITIN) 10 MG tablet Take 10 mg by mouth daily.   Yes [provider]  memantine (NAMENDA XR) 28 MG CP24 24 hr capsule TAKE 1 CAPSULE BY MOUTH EVERY DAY IN THE MORNING Patient taking differently: Take 28 mg by mouth daily.  05/02/18  Yes Garvin Fila, MD  Multiple Vitamins-Minerals (MULTIVITAMIN WITH MINERALS) tablet Take 1 tablet by mouth daily.    Yes [provider]  polyethylene glycol (MIRALAX / GLYCOLAX) packet Take 17 g by mouth daily as needed (constipation).  Yes [provider]  potassium chloride SA (KLOR-CON M20) 20 MEQ tablet Take 2 tablets (40 mEq total) by mouth daily. 09/29/17  Yes [name], DO  senna (SENOKOT) 8.6 MG TABS tablet Take 1 tablet (8.6 mg total) by mouth daily as needed for mild constipation. 07/13/16  Yes Velna Ochs, MD  levofloxacin (LEVAQUIN) 25 MG/ML solution Take 30 mLs (750 mg total) by mouth daily for 5 days. 05/26/18 05/31/18  Daven Montz, Jarrett Soho, PA-C  QUEtiapine (SEROQUEL) 25 MG tablet Take 0.5 tablets (12.5 mg total) by mouth at bedtime. Patient not taking: Reported on 05/26/2018 05/12/18   Donzetta Starch, NP  warfarin (COUMADIN) 5 MG tablet Take 0.5-1 tablets (2.5-5 mg total) by mouth See admin instructions. Take one tablet (5 mg) by mouth on Monday, Wednesday,  Friday at 6 pm, take 1/2 tablet (2.5 mg) on Sunday, Tuesday, Thursday, Saturday at 6pm Patient not taking: Reported on 05/26/2018 05/19/18   Donzetta Starch, NP    Family History Family History  Problem Relation Age of Onset  . Hypertension Father        Passed away from cerebral hemorrhage at age of 29.  [name] Dementia Mother   . Hypertension Child        4 adult children  . Cervical cancer Other   . Lung cancer Other   . Stroke Other   . Heart attack Neg Hx     Social History Social History   Tobacco Use  . Smoking status: Never Smoker  . Smokeless tobacco: Never Used  Substance Use Topics  . Alcohol use: No    Alcohol/week: 0.0 standard drinks  . Drug use: No     Allergies   Aricept [donepezil hcl] and Shrimp [shellfish allergy]   Review of Systems Review of Systems  Unable to perform ROS: Patient nonverbal  Respiratory: Positive for cough.      Physical Exam Updated Vital Signs BP 136/90   Pulse 70   Temp 97.6 F (36.4 C) (Axillary)   Resp 17   SpO2 97%   Physical Exam  Constitutional: He appears well-developed and well-nourished. No distress.  Awake, alert, nontoxic appearance  HENT:  Head: Normocephalic and atraumatic.  Mouth/Throat: Oropharynx is clear and moist. No oropharyngeal exudate.  Eyes: Conjunctivae are normal. No scleral icterus.  Neck: Normal range of motion. Neck supple.  Cardiovascular: Normal rate, regular rhythm and intact distal pulses.  Pulmonary/Chest: Effort normal and breath sounds normal. No respiratory distress. He has no wheezes.  Equal chest expansion Dry cough. No stridor.  Abdominal: Soft. Bowel sounds are normal. He exhibits no mass. There is no tenderness. There is no rebound and no guarding.  Musculoskeletal: Normal range of motion. He exhibits no edema.  Neurological: He is alert.  Speech is clear and goal oriented Moves extremities without ataxia  Skin: Skin is warm and dry. He is not diaphoretic.  Psychiatric: He has  a normal mood and affect.  Nursing note and vitals reviewed.    ED Treatments / Results  Labs (all labs ordered are listed, but only abnormal results are displayed) Labs Reviewed  PROTIME-INR - Abnormal; Notable for the following components:      Result Value   Prothrombin Time 18.0 (*)    All other components within normal limits  BASIC METABOLIC PANEL  CBC  BRAIN NATRIURETIC PEPTIDE  I-STAT TROPONIN, ED    EKG EKG Interpretation  Date/Time:  Friday May 26 2018 03:35:15 EDT Ventricular Rate:  90 PR Interval:  QRS Duration: 132 QT Interval:  390 QTC Calculation: 478 R Axis:   -10 Text Interpretation:  Sinus rhythm Ventricular premature complex Probable left atrial enlargement Right bundle branch block Artifact in lead(s) I II [name] aVR aVL aVF Confirmed by Randal Buba, April ([id number]) on 05/26/2018 3:55:59 AM   Radiology Dg Chest 2 View  Result Date: 05/26/2018 CLINICAL DATA:  Cough and shortness of breath for 3 days. EXAM: CHEST - 2 VIEW COMPARISON:  05/05/2018 FINDINGS: Shallow inspiration with elevation of bilateral hemidiaphragms. Colonic interposition under both hemidiaphragms. Atelectasis in the lung bases. Heart size appears normal. No pulmonary vascular congestion. No focal consolidation in the visualized lungs. No blunting of costophrenic angles. No pneumothorax. Calcification of the aorta. IMPRESSION: Shallow inspiration with atelectasis in the lung bases. Elevation of hemidiaphragms. No evidence of active pulmonary disease. Electronically Signed   By: Lucienne Capers M.D.   On: 05/26/2018 05:11    Procedures Procedures (including critical care time)  Medications Ordered in ED Medications  levofloxacin (LEVAQUIN) 25 MG/ML solution 750 mg (750 mg Oral Given 05/26/18 1856)     Initial Impression / Assessment and Plan / ED Course  I have reviewed the triage vital signs and the nursing notes.  Pertinent labs & imaging results that were available during my care of  the patient were reviewed by me and considered in my medical decision making (see chart for details).     Pt is well-appearing and afebrile.  Suspect he is having some aspiration.  He will need to move to thickened liquids.  Chest x-ray without consolidation.  I personally evaluated these images.  No leukocytosis, fever or tachycardia.  No evidence of sepsis.  We will begin a 5-day course of Levaquin to cover for likely early aspiration pneumonia as the source of his cough.  INR is 1.5 today.  She will need frequent INR checks to ensure he does not become supratherapeutic in combination with Levaquin.  Discussed this in detail with patient's daughter.  She will inform the rehabilitation center.  Hypoxia here in the emergency department.  BNP within normal limits.  No peripheral edema or leg swelling.  Her blood cell count is normal.  No tachycardia or hypotension.  The patient was discussed with and seen by Dr. Randal Buba who agrees with the treatment plan.   Final Clinical Impressions(s) / ED Diagnoses   Final diagnoses:  Cough  Aspiration pneumonia, unspecified aspiration pneumonia type, unspecified laterality, unspecified part of lung [facility])    ED Discharge Orders         Ordered    levofloxacin (LEVAQUIN) 25 MG/ML solution  Daily     05/26/18 0639           Leovanni Bjorkman, Jarrett Soho, PA-C 05/26/18 3149    Palumbo, April, MD 05/26/18 7026

## 2018-05-26 NOTE — ED Notes (Signed)
[facility] called for transport.  

## 2018-05-26 NOTE — ED Triage Notes (Signed)
Pt arrives to ED from [facility] with complaints of cough since 8pm 05/25/18. EMS reports the pt started having a cough around 8pm and family was worried. Pt currently not coughing. Pt nonverbal other than yes/no answers. Pt had stroke about a month ago. EMS gave pt 5mg  albuterol in route Pt placed in position of comfort with bed locked and lowered, call bell in reach.

## 2018-05-26 NOTE — ED Notes (Signed)
Report given to nurse, Willaim Rayas, at [facility] about pt returning to facility this morning

## 2018-05-26 NOTE — Discharge Instructions (Signed)
1. Medications: Levaquin, usual home medications including Coumadin however you will need frequent INR checks to prevent supratherapeutic levels 2. Treatment: rest, drink plenty of fluids, thickened liquids 3. Follow Up: Please followup with your primary doctor in 1-2 days for discussion of your diagnoses and further evaluation after today's visit; if you do not have a primary care doctor use the resource guide provided to find one; Please return to the ER for fevers, difficulty breathing, hypoxia or other concerns

## 2018-05-26 NOTE — ED Notes (Signed)
Daughter Lilli Light [phone]

## 2018-06-01 DIAGNOSIS — Z7901 Long term (current) use of anticoagulants: Secondary | ICD-10-CM | POA: Diagnosis not present

## 2018-06-01 DIAGNOSIS — G309 Alzheimer's disease, unspecified: Secondary | ICD-10-CM | POA: Diagnosis not present

## 2018-06-01 DIAGNOSIS — I6389 Other cerebral infarction: Secondary | ICD-10-CM | POA: Diagnosis not present

## 2018-06-01 DIAGNOSIS — I2699 Other pulmonary embolism without acute cor pulmonale: Secondary | ICD-10-CM | POA: Diagnosis not present

## 2018-06-05 ENCOUNTER — Ambulatory Visit (INDEPENDENT_AMBULATORY_CARE_PROVIDER_SITE_OTHER): Payer: Medicare Other | Admitting: Pharmacist

## 2018-06-05 ENCOUNTER — Ambulatory Visit: Payer: Medicare HMO

## 2018-06-05 ENCOUNTER — Ambulatory Visit (INDEPENDENT_AMBULATORY_CARE_PROVIDER_SITE_OTHER): Payer: Medicare Other | Admitting: Internal Medicine

## 2018-06-05 VITALS — BP 117/65 | HR 66 | Temp 98.6°F

## 2018-06-05 DIAGNOSIS — G301 Alzheimer's disease with late onset: Secondary | ICD-10-CM

## 2018-06-05 DIAGNOSIS — Z86711 Personal history of pulmonary embolism: Secondary | ICD-10-CM

## 2018-06-05 DIAGNOSIS — G20 Parkinson's disease: Secondary | ICD-10-CM

## 2018-06-05 DIAGNOSIS — I509 Heart failure, unspecified: Secondary | ICD-10-CM

## 2018-06-05 DIAGNOSIS — Z7902 Long term (current) use of antithrombotics/antiplatelets: Secondary | ICD-10-CM

## 2018-06-05 DIAGNOSIS — F0280 Dementia in other diseases classified elsewhere without behavioral disturbance: Secondary | ICD-10-CM

## 2018-06-05 DIAGNOSIS — I2510 Atherosclerotic heart disease of native coronary artery without angina pectoris: Secondary | ICD-10-CM | POA: Diagnosis not present

## 2018-06-05 DIAGNOSIS — R1310 Dysphagia, unspecified: Secondary | ICD-10-CM | POA: Diagnosis not present

## 2018-06-05 DIAGNOSIS — G309 Alzheimer's disease, unspecified: Secondary | ICD-10-CM

## 2018-06-05 DIAGNOSIS — Z7901 Long term (current) use of anticoagulants: Secondary | ICD-10-CM

## 2018-06-05 DIAGNOSIS — D352 Benign neoplasm of pituitary gland: Secondary | ICD-10-CM | POA: Diagnosis not present

## 2018-06-05 DIAGNOSIS — Z79899 Other long term (current) drug therapy: Secondary | ICD-10-CM | POA: Diagnosis not present

## 2018-06-05 DIAGNOSIS — I614 Nontraumatic intracerebral hemorrhage in cerebellum: Secondary | ICD-10-CM

## 2018-06-05 DIAGNOSIS — I619 Nontraumatic intracerebral hemorrhage, unspecified: Secondary | ICD-10-CM | POA: Diagnosis not present

## 2018-06-05 DIAGNOSIS — E876 Hypokalemia: Secondary | ICD-10-CM

## 2018-06-05 DIAGNOSIS — Z5181 Encounter for therapeutic drug level monitoring: Secondary | ICD-10-CM | POA: Diagnosis not present

## 2018-06-05 DIAGNOSIS — Z23 Encounter for immunization: Secondary | ICD-10-CM

## 2018-06-05 LAB — POCT INR: INR: 3.1 — AB (ref 2.0–3.0)

## 2018-06-05 NOTE — Progress Notes (Signed)
   CC: [facility]  HPI:  [name] [name] is a 84 y.o. with a PMH of alzheimer's dementia, parkinsonian syndrome, CHF (EF 40-45%), CAD, on chronic anticoagulation, presenting to clinic for hospital follow up for intracerebellar bleed.  Patient was admitted to the hospital on 8/16 after finding of right cerebellar hemorrhage possibly 2/2 fall; initial assessment was done due to diaphoresis which was thought to be due to bradycardia in setting of parkinson's disease. He was also found to have hyperkalemia to 6.4 w/o EKG changes per my review. He was admitted to neurology; warfarin was reversed and at discharged he was placed on asa 81mg  daily x2 wks, on 8/30 he was restarted on warfarin and asa was stopped. Atorvastatin was also started at 10mg  daily (statin previously stopped due to persistent CK elevation). He was discharged to [facility] acute rehab and returned home 4 days ago to live with his daughter and granddaughter.   Since being home, his daughter states he has overall done well; she is worried about his swallowing and has been feeding him a pureed diet per SLP recommendations. He had a possible aspiration event on 9/6 and was evaluated in ED; CXR w/o evidence of pna but was placed on levaquin to cover early aspiration pneumonia. He has not had similar events since then. He has not had any falls.   Please see problem based Assessment and Plan for status of patients chronic conditions.  Past Medical History:  Diagnosis Date  . Anxiety   . Coronary artery disease, non-occlusive    with history of MI; Cath 2008 w multivessel nonobstructive CAD  . Dementia   . Depression   . GERD (gastroesophageal reflux disease)   . Hyperlipidemia   . Hypertension   . Memory loss   . Parkinson disease ([place])   . PE (pulmonary embolism)    unprovoked PE completed 6 months of warfarin, warfarin d/ced 02/12/2010, repeat PE 07/10/2010 after a long car ride and now on lifelong coumadin.  . Pituitary  microadenoma ([place])    incidental finding CT 12/2009  . Prostate cancer ([place])   . Scoliosis   . Sigmoid volvulus ([place]) 08/02/2016  . Volvulus of colon ([place]) 06/01/2017    Review of Systems:   Per HPI  Physical Exam:  Vitals:   06/05/18 1557 06/05/18 1642  BP: (!) 153/94 117/65  Pulse: 67 66  Temp: 98.6 F (37 C)   TempSrc: Oral   SpO2: 100%    GENERAL- alert, intermittently follows commands HEENT- Atraumatic, normocephalic, oral mucosa appears moist. CARDIAC- RRR, no murmurs, rubs or gallops. RESP- Moving equal volumes of air, and clear to auscultation bilaterally, no wheezes or crackles. ABDOMEN- Soft, nontender. NEURO- CN 2-12 grossly intact. Able to follow some commands like bil hand grip and opening mouth after several prompts EXTREMITIES- pulse 2+ radial, symmetric, no pedal edema. SKIN- Warm, dry, no rash or lesion.  Assessment & Plan:   See Encounters Tab for problem based charting.   Patient discussed with Dr. Abelardo Diesel, MD Internal Medicine PGY-3

## 2018-06-05 NOTE — Patient Instructions (Signed)
Patient's daughter instructed to give medications as defined in the Anti-coagulation Track section of this encounter.  Patient's daughter  instructed to give today's dose.  Patient's daughter instructed to give warfarin as follows:  Give one-half (1/2) of  his 5mg  peach-colored warfarin every day--EXCEPT on [facility] and FRIDAYS--give one (1) tablet of his 5mg  peach-colored warfarin tablets on Wednesdays and Fridays.  Patient verbalized understanding of these instructions.

## 2018-06-05 NOTE — Progress Notes (Signed)
[name] [name] is a 84 y.o. male who reports to the clinic for monitoring of warfarin treatment.    Indication: PE, history of; Multiple history of embolic events. Long term current use of anticoagulants.   Duration: indefinite Supervising physician: Gilles Chiquito  [name] Visit History: Patient does not report signs/symptoms of bleeding or thromboembolism  Other recent changes: No diet, medications, lifestyle endorsed by daughter.  [name] Episode Summary    Current INR goal:   2.0-3.0  TTR:   73.5 % (7.3 y)  Next INR check:   07/03/2018  INR from last check:     Weekly max warfarin dose:     Target end date:   Indefinite  INR check location:   [name]  Preferred lab:     Send INR reminders to:   [facility]   Indications   History of pulmonary embolism [Z86.711] Encounter for long-term use of antiplatelets/antithrombotics [Z79.02]       Comments:   History of multiple venous embolic episodes. Will continue to annual re-evaluate continued need for warfarin weighing risks vs. benefits.       [name] Care Providers    Provider Role Specialty Phone number   Bertha Stakes, MD  Internal Medicine [phone]      Allergies  Allergen Reactions  . Aricept [Donepezil Hcl] Other (See Comments)    Symptomatic Bradycardia.  . Shrimp [Shellfish Allergy] Other (See Comments)    Causes gout   Prior to Admission medications   Medication Sig Start Date End Date Taking? Authorizing Provider  acetaminophen (TYLENOL) 500 MG tablet Take 1,000 mg by mouth 2 (two) times daily.    Yes [provider]  amLODipine (NORVASC) 2.5 MG tablet Take 1 tablet (2.5 mg total) by mouth daily. 11/17/17  Yes Ledell Noss, MD  atorvastatin (LIPITOR) 10 MG tablet Take 1 tablet (10 mg total) by mouth daily at 6 PM. 05/12/18  Yes Donzetta Starch, NP  b complex vitamins tablet Take 1 tablet by mouth daily.    Yes [provider]  Ca Phosphate-Cholecalciferol (CALCIUM/VITAMIN D3 GUMMIES) 250-350 MG-UNIT CHEW Chew 1 Dose by mouth 2 (two) times daily. Patient taking differently: Chew 1 tablet by mouth 2 (two) times daily.  07/08/16  Yes [name], DO  carbidopa-levodopa (SINEMET IR) 25-100 MG tablet Take 1 tablet by mouth 4 (four) times daily. 05/02/18  Yes Garvin Fila, MD  fish oil-omega-3 fatty acids 1000 MG capsule Take 2 g by mouth daily.    Yes [provider]  loratadine (CLARITIN) 10 MG tablet Take 10 mg by mouth daily.   Yes [provider]  memantine (NAMENDA XR) 28 MG CP24 24 hr capsule TAKE 1 CAPSULE BY MOUTH EVERY DAY IN THE MORNING Patient taking differently: Take 28 mg by mouth daily.  05/02/18  Yes Garvin Fila, MD  Multiple Vitamins-Minerals (MULTIVITAMIN WITH MINERALS) tablet Take 1 tablet by mouth daily.    Yes [provider]  polyethylene glycol (MIRALAX / GLYCOLAX) packet Take 17 g by mouth daily as needed (constipation).   Yes [provider]  potassium chloride SA (KLOR-CON M20) 20 MEQ tablet Take 2 tablets (40 mEq total) by mouth daily. 09/29/17  Yes [name], DO  QUEtiapine (SEROQUEL) 25 MG tablet Take 0.5 tablets (12.5 mg total) by mouth at bedtime. 05/12/18  Yes Donzetta Starch, NP  senna (SENOKOT) 8.6 MG TABS tablet Take 1 tablet (8.6 mg total) by mouth daily as needed for  mild constipation. 07/13/16  Yes Velna Ochs, MD  warfarin (COUMADIN) 5 MG tablet Take 0.5-1 tablets (2.5-5 mg total) by mouth See admin instructions. Take one tablet (5 mg) by mouth on Monday, Wednesday, Friday at 6 pm, take 1/2 tablet (2.5 mg) on Sunday, Tuesday, Thursday, Saturday at 6pm 05/19/18  Yes Donzetta Starch, NP   Past Medical History:  Diagnosis Date  . Anxiety   . Coronary artery disease, non-occlusive    with history of MI; Cath 2008 w multivessel nonobstructive CAD  . Dementia   . Depression   . GERD (gastroesophageal reflux disease)   .  Hyperlipidemia   . Hypertension   . Memory loss   . Parkinson disease ([place])   . PE (pulmonary embolism)    unprovoked PE completed 6 months of warfarin, warfarin d/ced 02/12/2010, repeat PE 07/10/2010 after a long car ride and now on lifelong coumadin.  . Pituitary microadenoma ([place])    incidental finding CT 12/2009  . Prostate cancer ([place])   . Scoliosis   . Sigmoid volvulus ([place]) 08/02/2016  . Volvulus of colon ([place]) 06/01/2017   Social History   Socioeconomic History  . Marital status: Married    Spouse name: Not on file  . Number of children: 4  . Years of education: master's  . Highest education level: Not on file  Occupational History  . Occupation: Retired    Fish farm manager: RETIRED  Social Needs  . Financial resource strain: Not on file  . Food insecurity:    Worry: Not on file    Inability: Not on file  . Transportation needs:    Medical: Not on file    Non-medical: Not on file  Tobacco Use  . Smoking status: Never Smoker  . Smokeless tobacco: Never Used  Substance and Sexual Activity  . Alcohol use: No    Alcohol/week: 0.0 standard drinks  . Drug use: No  . Sexual activity: Never  Lifestyle  . Physical activity:    Days per week: Not on file    Minutes per session: Not on file  . Stress: Not on file  Relationships  . Social connections:    Talks on phone: Not on file    Gets together: Not on file    Attends religious service: Not on file    Active member of club or organization: Not on file    Attends meetings of clubs or organizations: Not on file    Relationship status: Not on file  Other Topics Concern  . Not on file  Social History Narrative   Lives with daughter. Wife also has severe dementia.    Drinks 1 cup of coffee a day    Family History  Problem Relation Age of Onset  . Hypertension Father        Passed away from cerebral hemorrhage at age of 6.  [name] Dementia Mother   . Hypertension Child        4 adult children  . Cervical cancer Other    . Lung cancer Other   . Stroke Other   . Heart attack Neg Hx     ASSESSMENT Recent Results: The most recent result is correlated with 22.5 mg per week: Lab Results  Component Value Date   INR 3.1 (A) 06/05/2018   INR 1.50 05/26/2018   INR 1.16 05/10/2018    [name] Dosing: Description   Give one-half (1/2) of  his 5mg  peach-colored warfarin every day--EXCEPT on [facility] and FRIDAYS--give one (1) tablet of  his 5mg  peach-colored warfarin tablets on Wednesdays and Fridays.      INR today: Supratherapeutic  PLAN Weekly dose was decreased by 10% to 20 mg per week  Patient Instructions  Patient's daughter instructed to give medications as defined in the Anti-coagulation Track section of this encounter.  Patient's daughter  instructed to give today's dose.  Patient's daughter instructed to give warfarin as follows:  Give one-half (1/2) of  his 5mg  peach-colored warfarin every day--EXCEPT on [facility] and FRIDAYS--give one (1) tablet of his 5mg  peach-colored warfarin tablets on Wednesdays and Fridays.  Patient verbalized understanding of these instructions.     Patient advised to contact clinic or seek medical attention if signs/symptoms of bleeding or thromboembolism occur.  Patient verbalized understanding by repeating back information and was advised to contact me if further medication-related questions arise. Patient was also provided an information handout.  Follow-up Return in 4 weeks (on 07/03/2018) for Follow up INR at [place], PharmD, CACP, CPP  15 minutes spent face-to-face with the patient during the encounter. 50% of time spent on education. 50% of time was spent on fingerstick point of care INR sample collection, processing, results determination, dose adjustment and documentation in http://www.kim.net/.

## 2018-06-06 ENCOUNTER — Encounter: Payer: Self-pay | Admitting: Internal Medicine

## 2018-06-06 DIAGNOSIS — D352 Benign neoplasm of pituitary gland: Secondary | ICD-10-CM | POA: Insufficient documentation

## 2018-06-06 LAB — BMP8+ANION GAP
ANION GAP: 14 mmol/L (ref 10.0–18.0)
BUN/Creatinine Ratio: 9 — ABNORMAL LOW (ref 10–24)
BUN: 8 mg/dL (ref 8–27)
CALCIUM: 9.2 mg/dL (ref 8.6–10.2)
CHLORIDE: 104 mmol/L (ref 96–106)
CO2: 24 mmol/L (ref 20–29)
Creatinine, Ser: 0.89 mg/dL (ref 0.76–1.27)
GFR calc Af Amer: 90 mL/min/{1.73_m2} (ref >59)
GFR calc non Af Amer: 78 mL/min/{1.73_m2} (ref >59)
GLUCOSE: 101 mg/dL — AB (ref 65–99)
POTASSIUM: 4.5 mmol/L (ref 3.5–5.2)
Sodium: 142 mmol/L (ref 134–144)

## 2018-06-06 NOTE — Assessment & Plan Note (Signed)
On presentation to hospital patient had elevated K to 6.5 which was treated with D50 and insulin in the ED; he has since been restarted on his chronic K supplementation. His daughter is worried about repeat episodes of hyperkalemia with continued supplementation and wonders if his potassium level can be checked every month when he comes in for INR checks.  Plan: --bmet today with stable K at 4.5 --will message PCP about decision for monthly potassium checks

## 2018-06-06 NOTE — Assessment & Plan Note (Signed)
Patient with advanced Alzheimer dementia with parkinsonian syndrome. He is being followed by outpatient palliative care. Daughter is main caretaker and is still working with palliative care on obtaining all necessary equipment and support that she needs to care for him at home. She has had trouble obtaining respite care, unfortunately, but is hoping it can be set up. We briefly discussed risk of aspiration and daughter verbalized that just like prior conversations had been had with physicians she would want feeding tube placed if it came to it since her father still has some lucid periods where he can affectively and meaningfully participate in conversation with his family.   She does wish for [facility] services at home which I will order. She will continue working with palliative care and let us know if we can help out in any other way as well.

## 2018-06-06 NOTE — Assessment & Plan Note (Signed)
On recent MRI, patient was noted to have pituitary enlargement to 23mm; on chart review it appears this is stable in size over the years. In 2010 this was worked up with prolactin which was wnl, tsh wnl (T4 has not been checked). Discussed briefly with daughter and she prefers to defer further workup for now which is reasonable as size has been stable, does not seem to be having mass effect and may not be clinically relevant in setting of his comorbidities; with his hypokalemia an ACTH secreting tumor is possible, however no other s/sx of this are present at this time.

## 2018-06-06 NOTE — Assessment & Plan Note (Signed)
Patient with cerebellar intracranial bleed, hospitalized in August 2019; he has since been restarted on warfarin and daughter states he has been doing relatively well at home. She has no concerns for new neurological changes; limited exam is nonfocal.

## 2018-06-07 NOTE — Progress Notes (Signed)
Internal Medicine Clinic Attending  Case discussed with Dr. Svalina  at the time of the visit.  We reviewed the resident's history and exam and pertinent patient test results.  I agree with the assessment, diagnosis, and plan of care documented in the resident's note.  

## 2018-06-10 DIAGNOSIS — I2510 Atherosclerotic heart disease of native coronary artery without angina pectoris: Secondary | ICD-10-CM | POA: Diagnosis not present

## 2018-06-10 DIAGNOSIS — F419 Anxiety disorder, unspecified: Secondary | ICD-10-CM | POA: Diagnosis not present

## 2018-06-10 DIAGNOSIS — R1310 Dysphagia, unspecified: Secondary | ICD-10-CM | POA: Diagnosis not present

## 2018-06-10 DIAGNOSIS — G309 Alzheimer's disease, unspecified: Secondary | ICD-10-CM | POA: Diagnosis not present

## 2018-06-10 DIAGNOSIS — I69391 Dysphagia following cerebral infarction: Secondary | ICD-10-CM | POA: Diagnosis not present

## 2018-06-10 DIAGNOSIS — G20 Parkinson's disease: Secondary | ICD-10-CM | POA: Diagnosis not present

## 2018-06-10 DIAGNOSIS — I10 Essential (primary) hypertension: Secondary | ICD-10-CM | POA: Diagnosis not present

## 2018-06-10 DIAGNOSIS — F0280 Dementia in other diseases classified elsewhere without behavioral disturbance: Secondary | ICD-10-CM | POA: Diagnosis not present

## 2018-06-10 DIAGNOSIS — F329 Major depressive disorder, single episode, unspecified: Secondary | ICD-10-CM | POA: Diagnosis not present

## 2018-06-11 DIAGNOSIS — F0280 Dementia in other diseases classified elsewhere without behavioral disturbance: Secondary | ICD-10-CM | POA: Diagnosis not present

## 2018-06-11 DIAGNOSIS — I2510 Atherosclerotic heart disease of native coronary artery without angina pectoris: Secondary | ICD-10-CM | POA: Diagnosis not present

## 2018-06-11 DIAGNOSIS — I69391 Dysphagia following cerebral infarction: Secondary | ICD-10-CM | POA: Diagnosis not present

## 2018-06-11 DIAGNOSIS — F419 Anxiety disorder, unspecified: Secondary | ICD-10-CM | POA: Diagnosis not present

## 2018-06-11 DIAGNOSIS — I10 Essential (primary) hypertension: Secondary | ICD-10-CM | POA: Diagnosis not present

## 2018-06-11 DIAGNOSIS — G20 Parkinson's disease: Secondary | ICD-10-CM | POA: Diagnosis not present

## 2018-06-11 DIAGNOSIS — R1310 Dysphagia, unspecified: Secondary | ICD-10-CM | POA: Diagnosis not present

## 2018-06-11 DIAGNOSIS — G309 Alzheimer's disease, unspecified: Secondary | ICD-10-CM | POA: Diagnosis not present

## 2018-06-11 DIAGNOSIS — F329 Major depressive disorder, single episode, unspecified: Secondary | ICD-10-CM | POA: Diagnosis not present

## 2018-06-12 ENCOUNTER — Other Ambulatory Visit: Payer: Self-pay | Admitting: Internal Medicine

## 2018-06-12 DIAGNOSIS — R1310 Dysphagia, unspecified: Secondary | ICD-10-CM | POA: Diagnosis not present

## 2018-06-12 DIAGNOSIS — I69391 Dysphagia following cerebral infarction: Secondary | ICD-10-CM | POA: Diagnosis not present

## 2018-06-12 DIAGNOSIS — I2510 Atherosclerotic heart disease of native coronary artery without angina pectoris: Secondary | ICD-10-CM | POA: Diagnosis not present

## 2018-06-12 DIAGNOSIS — F329 Major depressive disorder, single episode, unspecified: Secondary | ICD-10-CM | POA: Diagnosis not present

## 2018-06-12 DIAGNOSIS — G20 Parkinson's disease: Secondary | ICD-10-CM | POA: Diagnosis not present

## 2018-06-12 DIAGNOSIS — E875 Hyperkalemia: Secondary | ICD-10-CM

## 2018-06-12 DIAGNOSIS — F0280 Dementia in other diseases classified elsewhere without behavioral disturbance: Secondary | ICD-10-CM | POA: Diagnosis not present

## 2018-06-12 DIAGNOSIS — F419 Anxiety disorder, unspecified: Secondary | ICD-10-CM | POA: Diagnosis not present

## 2018-06-12 DIAGNOSIS — G309 Alzheimer's disease, unspecified: Secondary | ICD-10-CM | POA: Diagnosis not present

## 2018-06-12 DIAGNOSIS — I10 Essential (primary) hypertension: Secondary | ICD-10-CM | POA: Diagnosis not present

## 2018-06-12 DIAGNOSIS — Z8639 Personal history of other endocrine, nutritional and metabolic disease: Secondary | ICD-10-CM

## 2018-06-12 NOTE — Progress Notes (Signed)
Placed order for monthly BMP checks due to history of hyperkalemia, he does need potassium supplementation due to his history of sigmoid volvulous.  These are to be collected during INR checks.

## 2018-06-13 ENCOUNTER — Telehealth: Payer: Self-pay | Admitting: Internal Medicine

## 2018-06-13 ENCOUNTER — Telehealth: Payer: Self-pay | Admitting: *Deleted

## 2018-06-13 DIAGNOSIS — G20 Parkinson's disease: Secondary | ICD-10-CM | POA: Diagnosis not present

## 2018-06-13 DIAGNOSIS — R1310 Dysphagia, unspecified: Secondary | ICD-10-CM | POA: Diagnosis not present

## 2018-06-13 DIAGNOSIS — F419 Anxiety disorder, unspecified: Secondary | ICD-10-CM | POA: Diagnosis not present

## 2018-06-13 DIAGNOSIS — G309 Alzheimer's disease, unspecified: Secondary | ICD-10-CM | POA: Diagnosis not present

## 2018-06-13 DIAGNOSIS — I10 Essential (primary) hypertension: Secondary | ICD-10-CM | POA: Diagnosis not present

## 2018-06-13 DIAGNOSIS — F0280 Dementia in other diseases classified elsewhere without behavioral disturbance: Secondary | ICD-10-CM | POA: Diagnosis not present

## 2018-06-13 DIAGNOSIS — I69391 Dysphagia following cerebral infarction: Secondary | ICD-10-CM | POA: Diagnosis not present

## 2018-06-13 DIAGNOSIS — I2510 Atherosclerotic heart disease of native coronary artery without angina pectoris: Secondary | ICD-10-CM | POA: Diagnosis not present

## 2018-06-13 DIAGNOSIS — F329 Major depressive disorder, single episode, unspecified: Secondary | ICD-10-CM | POA: Diagnosis not present

## 2018-06-13 NOTE — Telephone Encounter (Signed)
Returned call to [place], [place] with [employer]. Verbal auth given for PT twice weekly for 4 weeks mainly to work on Financial risk analyst education in assisting patient. Will route to PCP for agreement/denial. Hubbard Hartshorn, RN, BSN

## 2018-06-13 NOTE — Telephone Encounter (Signed)
[facility] VO for resume HHN 1x week for 2 weeks then 2x month for 2 months and 3 prn HHN visits, also VO for PT, OT and Speech eval and treat. Do you agree?

## 2018-06-13 NOTE — Telephone Encounter (Signed)
agree

## 2018-06-13 NOTE — Telephone Encounter (Signed)
[name] [name] [phone] home health order

## 2018-06-13 NOTE — Progress Notes (Signed)
I reviewed Dr. Gladstone Pih note.  Patient is on [facility] for VTE.  His INR was 3.1 and dose was decreased.

## 2018-06-13 NOTE — Telephone Encounter (Signed)
Rec'd call from [facility] from [place].  Pt's Daughter requesting to have [facility] draw the patient's PT/INR. Pt's daughter states she is unable to get the patient to and from his Coumadin Appts at the office at this time.  Please call back.

## 2018-06-14 ENCOUNTER — Other Ambulatory Visit: Payer: Self-pay | Admitting: Student in an Organized Health Care Education/Training Program

## 2018-06-14 NOTE — Telephone Encounter (Signed)
agree

## 2018-06-14 NOTE — Telephone Encounter (Signed)
Agree. They may call values to me at [phone] (my personal cell phone).

## 2018-06-19 ENCOUNTER — Other Ambulatory Visit: Payer: Self-pay | Admitting: Internal Medicine

## 2018-06-19 MED ORDER — ATORVASTATIN CALCIUM 10 MG PO TABS: 10.0000 mg | ORAL_TABLET | Freq: Every day | ORAL | 1 refills | Status: DC

## 2018-06-19 NOTE — Telephone Encounter (Signed)
Refill Request  atorvastatin (LIPITOR) 10 MG tablet  [id number] - [place]

## 2018-06-20 NOTE — Telephone Encounter (Signed)
Returned call to [employer] from [place]. Given verbal auth to draw PT/INR and call results to Dr. Elie Confer (cell number provided). Confirmed his next draw was scheduled 07/03/2018. Hubbard Hartshorn, RN, BSN

## 2018-06-23 ENCOUNTER — Ambulatory Visit: Payer: Medicare HMO | Admitting: Adult Health

## 2018-06-27 ENCOUNTER — Other Ambulatory Visit: Payer: Self-pay | Admitting: *Deleted

## 2018-06-27 DIAGNOSIS — E876 Hypokalemia: Secondary | ICD-10-CM

## 2018-06-27 MED ORDER — POTASSIUM CHLORIDE CRYS ER 20 MEQ PO TBCR: 40.0000 meq | EXTENDED_RELEASE_TABLET | Freq: Every day | ORAL | 1 refills | Status: DC

## 2018-07-03 ENCOUNTER — Other Ambulatory Visit (INDEPENDENT_AMBULATORY_CARE_PROVIDER_SITE_OTHER): Payer: Medicare HMO

## 2018-07-03 ENCOUNTER — Ambulatory Visit (INDEPENDENT_AMBULATORY_CARE_PROVIDER_SITE_OTHER): Payer: Medicare Other | Admitting: Pharmacist

## 2018-07-03 DIAGNOSIS — Z7901 Long term (current) use of anticoagulants: Secondary | ICD-10-CM | POA: Diagnosis not present

## 2018-07-03 DIAGNOSIS — Z7902 Long term (current) use of antithrombotics/antiplatelets: Secondary | ICD-10-CM

## 2018-07-03 DIAGNOSIS — Z5181 Encounter for therapeutic drug level monitoring: Secondary | ICD-10-CM | POA: Diagnosis not present

## 2018-07-03 DIAGNOSIS — Z86711 Personal history of pulmonary embolism: Secondary | ICD-10-CM

## 2018-07-03 DIAGNOSIS — Z8639 Personal history of other endocrine, nutritional and metabolic disease: Secondary | ICD-10-CM

## 2018-07-03 LAB — POCT INR: INR: 2.4 (ref 2.0–3.0)

## 2018-07-03 NOTE — Patient Instructions (Signed)
Patient instructed to take medications as defined in the Anti-coagulation Track section of this encounter.  Patient instructed to take today's dose.  Patient instructed to take one-half (1/2) of  his 5mg  peach-colored warfarin every day--EXCEPT on [facility] and FRIDAYS--give one (1) tablet of his 5mg  peach-colored warfarin tablets on Wednesdays and Fridays.  Patient verbalized understanding of these instructions.

## 2018-07-03 NOTE — Progress Notes (Signed)
Anticoagulation Management [name] [name] is a 84 y.o. male who reports to the clinic for monitoring of warfarin treatment.    Indication: PE, history of; History of multiple venous embolic events; Long term current use of anticoagulant.   Duration: indefinite Supervising physician: Georjean Mode, MD  Anticoagulation Clinic Visit History: Patient does not report signs/symptoms of bleeding or thromboembolism  Other recent changes: No diet, medications, lifestyle changes endorsed at this visit.  Anticoagulation Episode Summary    Current INR goal:   2.0-3.0  TTR:   73.7 % (7.4 y)  Next INR check:   07/31/2018  INR from last check:   2.4 (07/03/2018)  Weekly max warfarin dose:     Target end date:   Indefinite  INR check location:   Anticoagulation Clinic  Preferred lab:     Send INR reminders to:   [facility]   Indications   History of pulmonary embolism [Z86.711] Encounter for long-term use of antiplatelets/antithrombotics [Z79.02]       Comments:   History of multiple venous embolic episodes. Will continue to annual re-evaluate continued need for warfarin weighing risks vs. benefits.       Anticoagulation Care Providers    Provider Role Specialty Phone number   Bertha Stakes, MD  Internal Medicine [phone]      Allergies  Allergen Reactions  . Aricept [Donepezil Hcl] Other (See Comments)    Symptomatic Bradycardia.  . Shrimp [Shellfish Allergy] Other (See Comments)    Causes gout   Prior to Admission medications   Medication Sig Start Date End Date Taking? Authorizing Provider  acetaminophen (TYLENOL) 500 MG tablet Take 1,000 mg by mouth 2 (two) times daily.    Yes [provider]  amLODipine (NORVASC) 2.5 MG tablet Take 1 tablet (2.5 mg total) by mouth daily. 11/17/17  Yes Ledell Noss, MD  atorvastatin (LIPITOR) 10 MG tablet Take 1 tablet (10 mg total) by mouth daily at 6 PM. 06/19/18  Yes [name], DO  b complex vitamins tablet Take 1 tablet by  mouth daily.    Yes [provider]  Ca Phosphate-Cholecalciferol (CALCIUM/VITAMIN D3 GUMMIES) 250-350 MG-UNIT CHEW Chew 1 Dose by mouth 2 (two) times daily. Patient taking differently: Chew 1 tablet by mouth 2 (two) times daily.  07/08/16  Yes [name], DO  carbidopa-levodopa (SINEMET IR) 25-100 MG tablet Take 1 tablet by mouth 4 (four) times daily. 05/02/18  Yes Garvin Fila, MD  fish oil-omega-3 fatty acids 1000 MG capsule Take 2 g by mouth daily.    Yes [provider]  loratadine (CLARITIN) 10 MG tablet Take 10 mg by mouth daily.   Yes [provider]  memantine (NAMENDA XR) 28 MG CP24 24 hr capsule TAKE 1 CAPSULE BY MOUTH EVERY DAY IN THE MORNING Patient taking differently: Take 28 mg by mouth daily.  05/02/18  Yes Garvin Fila, MD  Multiple Vitamins-Minerals (MULTIVITAMIN WITH MINERALS) tablet Take 1 tablet by mouth daily.    Yes [provider]  polyethylene glycol (MIRALAX / GLYCOLAX) packet Take 17 g by mouth daily as needed (constipation).   Yes [provider]  potassium chloride SA (KLOR-CON M20) 20 MEQ tablet Take 2 tablets (40 mEq total) by mouth daily. 06/27/18  Yes [name], DO  QUEtiapine (SEROQUEL) 25 MG tablet Take 0.5 tablets (12.5 mg total) by mouth at bedtime. 05/12/18  Yes Donzetta Starch, NP  senna (SENOKOT) 8.6 MG TABS tablet Take 1 tablet (8.6 mg total) by  mouth daily as needed for mild constipation. 07/13/16  Yes Velna Ochs, MD  warfarin (COUMADIN) 5 MG tablet Take 0.5-1 tablets (2.5-5 mg total) by mouth See admin instructions. Take one tablet (5 mg) by mouth on Monday, Wednesday, Friday at 6 pm, take 1/2 tablet (2.5 mg) on Sunday, Tuesday, Thursday, Saturday at 6pm 05/19/18  Yes Donzetta Starch, NP  warfarin (COUMADIN) 5 MG tablet TAKE 1/2 TABLET BY MOUTH EVERY TUE, THU, SAT AND 1 TABLET BY MOUTH ALL OTHER DAYS 06/16/18  Yes [name], DO   Past Medical History:  Diagnosis Date  . Anxiety   .  Coronary artery disease, non-occlusive    with history of MI; Cath 2008 w multivessel nonobstructive CAD  . Dementia   . Depression   . GERD (gastroesophageal reflux disease)   . Hyperlipidemia   . Hypertension   . Memory loss   . Parkinson disease ([place])   . PE (pulmonary embolism)    unprovoked PE completed 6 months of warfarin, warfarin d/ced 02/12/2010, repeat PE 07/10/2010 after a long car ride and now on lifelong coumadin.  . Pituitary microadenoma ([place])    incidental finding CT 12/2009  . Prostate cancer ([place])   . Scoliosis   . Sigmoid volvulus ([place]) 08/02/2016  . Volvulus of colon ([place]) 06/01/2017   Social History   Socioeconomic History  . Marital status: Married    Spouse name: Not on file  . Number of children: 4  . Years of education: master's  . Highest education level: Not on file  Occupational History  . Occupation: Retired    Fish farm manager: RETIRED  Social Needs  . Financial resource strain: Not on file  . Food insecurity:    Worry: Not on file    Inability: Not on file  . Transportation needs:    Medical: Not on file    Non-medical: Not on file  Tobacco Use  . Smoking status: Never Smoker  . Smokeless tobacco: Never Used  Substance and Sexual Activity  . Alcohol use: No    Alcohol/week: 0.0 standard drinks  . Drug use: No  . Sexual activity: Never  Lifestyle  . Physical activity:    Days per week: Not on file    Minutes per session: Not on file  . Stress: Not on file  Relationships  . Social connections:    Talks on phone: Not on file    Gets together: Not on file    Attends religious service: Not on file    Active member of club or organization: Not on file    Attends meetings of clubs or organizations: Not on file    Relationship status: Not on file  Other Topics Concern  . Not on file  Social History Narrative   Lives with daughter. Wife also has severe dementia.    Drinks 1 cup of coffee a day    Family History  Problem Relation Age of  Onset  . Hypertension Father        Passed away from cerebral hemorrhage at age of 21.  [name] Dementia Mother   . Hypertension Child        4 adult children  . Cervical cancer Other   . Lung cancer Other   . Stroke Other   . Heart attack Neg Hx     ASSESSMENT Recent Results: The most recent result is correlated with 22.5 mg per week: Lab Results  Component Value Date   INR 2.4 07/03/2018   INR  3.1 (A) 06/05/2018   INR 1.50 05/26/2018    Anticoagulation Dosing: Description   Give one-half (1/2) of  his 5mg  peach-colored warfarin every day--EXCEPT on [facility] and FRIDAYS--give one (1) tablet of his 5mg  peach-colored warfarin tablets on Wednesdays and Fridays.      INR today: Therapeutic  PLAN Weekly dose was unchanged.    Patient Instructions  Patient instructed to take medications as defined in the Anti-coagulation Track section of this encounter.  Patient instructed to take today's dose.  Patient instructed to take one-half (1/2) of  his 5mg  peach-colored warfarin every day--EXCEPT on [facility] and FRIDAYS--give one (1) tablet of his 5mg  peach-colored warfarin tablets on Wednesdays and Fridays.  Patient verbalized understanding of these instructions.     Patient advised to contact clinic or seek medical attention if signs/symptoms of bleeding or thromboembolism occur.  Patient verbalized understanding by repeating back information and was advised to contact me if further medication-related questions arise. Patient was also provided an information handout.  Follow-up Return in 4 weeks (on 07/31/2018) for Follow up INR at 2:45PM.  Pennie Banter , PharmD, CPP 15 minutes spent face-to-face with the patient during the encounter. 50% of time spent on education. 50% of time was spent on fingerstick point of care INR sample collection, processing, results determination, and documentation in http://www.kim.net/.

## 2018-07-04 LAB — BMP8+ANION GAP
ANION GAP: 22 mmol/L — AB (ref 10.0–18.0)
BUN/Creatinine Ratio: 9 — ABNORMAL LOW (ref 10–24)
BUN: 7 mg/dL — ABNORMAL LOW (ref 8–27)
CALCIUM: 9.5 mg/dL (ref 8.6–10.2)
CHLORIDE: 104 mmol/L (ref 96–106)
CO2: 21 mmol/L (ref 20–29)
Creatinine, Ser: 0.78 mg/dL (ref 0.76–1.27)
GFR calc non Af Amer: 82 mL/min/{1.73_m2} (ref >59)
GFR, EST AFRICAN AMERICAN: 95 mL/min/{1.73_m2} (ref >59)
GLUCOSE: 117 mg/dL — AB (ref 65–99)
POTASSIUM: 4.3 mmol/L (ref 3.5–5.2)
Sodium: 147 mmol/L — ABNORMAL HIGH (ref 134–144)

## 2018-07-04 NOTE — Progress Notes (Signed)
INTERNAL MEDICINE TEACHING ATTENDING ADDENDUM - Eliazer Hemphill M.D  Duration- indefinite, Indication- recurrent PE, INR- therapeutic. Agree with pharmacy recommendations as outlined in their note.      

## 2018-07-11 ENCOUNTER — Telehealth: Payer: Self-pay

## 2018-07-11 DIAGNOSIS — R8290 Unspecified abnormal findings in urine: Secondary | ICD-10-CM

## 2018-07-11 NOTE — Telephone Encounter (Signed)
Pt's daughter states that his urine has a strong odor, he seems "off", she states she has sterile containers given to her by the lab and an unused condom cath to collect sample in, could she bring that urine in to the clinic?

## 2018-07-11 NOTE — Telephone Encounter (Signed)
Requesting to speak with a nurse about UTI. Please call back.

## 2018-07-12 ENCOUNTER — Other Ambulatory Visit: Payer: Medicare HMO

## 2018-07-12 DIAGNOSIS — R8290 Unspecified abnormal findings in urine: Secondary | ICD-10-CM

## 2018-07-12 NOTE — Telephone Encounter (Signed)
That would be fine for her to bring the sample. He typically does not express much symptoms.  I have ordered a UA and Culture

## 2018-07-12 NOTE — Telephone Encounter (Signed)
Called daughter, she will bring in sample

## 2018-07-13 ENCOUNTER — Ambulatory Visit: Payer: Medicare HMO | Admitting: Adult Health

## 2018-07-13 ENCOUNTER — Encounter: Payer: Self-pay | Admitting: Adult Health

## 2018-07-13 VITALS — BP 114/77 | HR 57

## 2018-07-13 DIAGNOSIS — F01518 Vascular dementia, unspecified severity, with other behavioral disturbance: Secondary | ICD-10-CM

## 2018-07-13 DIAGNOSIS — G214 Vascular parkinsonism: Secondary | ICD-10-CM | POA: Diagnosis not present

## 2018-07-13 DIAGNOSIS — I614 Nontraumatic intracerebral hemorrhage in cerebellum: Secondary | ICD-10-CM | POA: Diagnosis not present

## 2018-07-13 DIAGNOSIS — F0151 Vascular dementia with behavioral disturbance: Secondary | ICD-10-CM | POA: Diagnosis not present

## 2018-07-13 LAB — URINALYSIS, ROUTINE W REFLEX MICROSCOPIC
BILIRUBIN UA: NEGATIVE
GLUCOSE, UA: NEGATIVE
LEUKOCYTES UA: NEGATIVE
Nitrite, UA: NEGATIVE
Protein, UA: NEGATIVE
RBC UA: NEGATIVE
SPEC GRAV UA: 1.022 (ref 1.005–1.030)
Urobilinogen, Ur: 0.2 mg/dL (ref 0.2–1.0)
pH, UA: 5 (ref 5.0–7.5)

## 2018-07-13 LAB — SPECIMEN STATUS REPORT

## 2018-07-13 NOTE — Patient Instructions (Signed)
Continue warfarin daily  and lipitor  for secondary stroke prevention  Continue to follow up with PCP regarding cholesterol and blood pressure management   Continue hospice program and if you decide you would like this stopped, please let us know and we can start therapies but for now, continue therapies at home   Continue to monitor blood pressure at home  Maintain strict control of hypertension with blood pressure goal below 130/90, diabetes with hemoglobin A1c goal below 6.5% and cholesterol with LDL cholesterol (bad cholesterol) goal below 70 mg/dL. I also advised the patient to eat a healthy diet with plenty of whole grains, cereals, fruits and vegetables, exercise regularly and maintain ideal body weight.  Followup in the future with me in 6 months or call earlier if needed       Thank you for coming to see [name] at [facility]. I hope we have been able to provide you high quality care today.  You may receive a patient satisfaction survey over the next few weeks. We would appreciate your feedback and comments so that we may continue to improve ourselves and the health of our patients.

## 2018-07-13 NOTE — Progress Notes (Signed)
[facility] [address] [place]. [place] [address] [phone]       OFFICE FOLLOW UP NOTE  [name] Date of Birth:  1931-09-20 Medical Record Number:  [id number]   Reason for Referral:  hospital stroke follow up  CHIEF COMPLAINT:  Chief Complaint  Patient presents with  . 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  Patient has also been being followed in this office for many years for history of Parkinson's and dementia.  History obtained from patient, daughter and chart review. Reviewed all radiology images and labs personally.  [name] [name] is an 84 y.o. male with history of CAD, HTN, HLD, dementia, PE (on warfarin), advanced parkinson's disease, CHF with LVEF of 40 to 45% documented in 2016  who presented with bradycardia with acute onset of diaphoresis and decreased alertness.  CT had reviewed and showed a small focus of hemorrhage in the right posterior fossa likely intraparenchymal within the cerebellum.  As patient does take warfarin at home, this was reversed in the ED. He did not receive IV t-PA due to [name] [name].  MRI brain reviewed and showed subacute hematoma right cerebellum with methemoglobin formation and surrounding edema.  Felt as though ICH due to nontraumatic warfarin associated coagulopathy.  He was on warfarin and aspirin 81 mg daily admission and recommended continuing aspirin 81 mg at discharge and consider restarting warfarin approximately 2 weeks after repeat imaging with stopping of aspirin at that time.  HTN stable during admission recommended long-term BP goal normotensive range.  LDL 148 and patient was previously taking fish oil and recommended to start low-dose atorvastatin.  Patient discharged to [facility] for continued  PT/OT/ST.  Patient is being seen today for hospital follow-up and is accompanied by daughter.  He has since returned home on 06/01/2018 with recommendations of home health PT/OT/ST.  Patient has been overall doing well with continued swallowing concerns but no evidence of aspiration.  Daughter states especially in the morning hours, she will be feeding him and patient will hold food in his mouth.  He was seen in the ED for possible aspiration but no evidence of aspiration and no issue since this time.  Daughter is aware that they may need to pursue tube feeding in the future if he continues to have this issue.  He has completed therapies and is currently under hospice care which daughter was under the impression that he was palliative care.  She is unable to obtain therapies through hospice care but she does ensure he is getting therapy with assistance of CNA through hospice and herself.  He does have around-the-clock CNA care for continued assistance.  He has since also been restarted on warfarin and stopping of aspirin.  Recent INR 2.4 on 07/03/2018.  He has also continued on atorvastatin 10 mg daily without side effects of myalgias.  Blood pressure today satisfactory 114/77.  He continues to take Namenda for his memory and the Sinemet for his Parkinson's.  She no longer gives him Seroquel as she was advised to stop this during hospital admission but she does give him melatonin at night to assist with sleep.  She denies any worsening memory symptoms or Parkinson's symptoms since his stroke.  No further concerns at this time.  Denies new  or worsening stroke/TIA symptoms.     ROS:   14 system review of systems performed and negative with exception of difficulty swallowing  PMH:  Past Medical History:  Diagnosis Date  . Anxiety   . Coronary artery disease, non-occlusive    with history of MI; Cath 2008 w multivessel nonobstructive CAD  . Dementia   . Depression   . GERD (gastroesophageal reflux  disease)   . Hyperlipidemia   . Hypertension   . Memory loss   . Parkinson disease ([place])   . PE (pulmonary embolism)    unprovoked PE completed 6 months of warfarin, warfarin d/ced 02/12/2010, repeat PE 07/10/2010 after a long car ride and now on lifelong coumadin.  . Pituitary microadenoma ([place])    incidental finding CT 12/2009  . Prostate cancer ([place])   . Scoliosis   . Sigmoid volvulus ([place]) 08/02/2016  . Volvulus of colon ([place]) 06/01/2017    PSH:  Past Surgical History:  Procedure Laterality Date  . CATARACT EXTRACTION    . COLONOSCOPY N/A 05/30/2017   Procedure: COLONOSCOPY;  Surgeon: Ronald Lobo, MD;  Location: [facility] ENDOSCOPY;  Service: Endoscopy;  Laterality: N/A;  . FLEXIBLE SIGMOIDOSCOPY N/A 08/02/2016   Procedure: FLEXIBLE SIGMOIDOSCOPY;  Surgeon: Ronald Lobo, MD;  Location: [facility] ENDOSCOPY;  Service: Endoscopy;  Laterality: N/A;  . FLEXIBLE SIGMOIDOSCOPY N/A 08/06/2016   Procedure: FLEXIBLE SIGMOIDOSCOPY;  Surgeon: Ronald Lobo, MD;  Location: [facility] ENDOSCOPY;  Service: Endoscopy;  Laterality: N/A;  . FLEXIBLE SIGMOIDOSCOPY Left 06/02/2017   Procedure: FLEXIBLE SIGMOIDOSCOPY;  Surgeon: Arta Silence, MD;  Location: [facility] ENDOSCOPY;  Service: Endoscopy;  Laterality: Left;  . FLEXIBLE SIGMOIDOSCOPY N/A 06/07/2017   Procedure: FLEXIBLE SIGMOIDOSCOPY for possible decompression;  Surgeon: Otis Brace, MD;  Location: [place];  Service: Gastroenterology;  Laterality: N/A;  . LEFT HEART CATHETERIZATION WITH CORONARY ANGIOGRAM N/A 12/10/2014   Procedure: LEFT HEART CATHETERIZATION WITH CORONARY ANGIOGRAM;  Surgeon: [name], MD;  Location: [facility] C[facility] LAB;  Service: Cardiovascular;  Laterality: N/A;  . LOOP RECORDER IMPLANT N/A 12/16/2014   Procedure: LOOP RECORDER IMPLANT;  Surgeon: [name], MD;  Location: [facility] CATH LAB;  Service: Cardiovascular;  Laterality: N/A;  . PARTIAL COLECTOMY N/A 06/08/2017   Procedure: SIGMOID  COLECTOMY;  Surgeon: Georganna Skeans, MD;   Location: [place];  Service: General;  Laterality: N/A;  . PROSTATECTOMY      Social History:  Social History   Socioeconomic History  . Marital status: Married    Spouse name: Not on file  . Number of children: 4  . Years of education: master's  . Highest education level: Not on file  Occupational History  . Occupation: Retired    Fish farm manager: RETIRED  Social Needs  . Financial resource strain: Not on file  . Food insecurity:    Worry: Not on file    Inability: Not on file  . Transportation needs:    Medical: Not on file    Non-medical: Not on file  Tobacco Use  . Smoking status: Never Smoker  . Smokeless tobacco: Never Used  Substance and Sexual Activity  . Alcohol use: No    Alcohol/week: 0.0 standard drinks  . Drug use: No  . Sexual activity: Never  Lifestyle  . Physical activity:    Days per week: Not on file    Minutes per session: Not on file  . Stress: Not on file  Relationships  . Social connections:    Talks on phone: Not on file    Gets  together: Not on file    Attends religious service: Not on file    Active member of club or organization: Not on file    Attends meetings of clubs or organizations: Not on file    Relationship status: Not on file  . Intimate partner violence:    Fear of current or ex partner: Not on file    Emotionally abused: Not on file    Physically abused: Not on file    Forced sexual activity: Not on file  Other Topics Concern  . Not on file  Social History Narrative   Lives with daughter. Wife also has severe dementia.    Drinks 1 cup of coffee a day     Family History:  Family History  Problem Relation Age of Onset  . Hypertension Father        Passed away from cerebral hemorrhage at age of 2.  [name] Dementia Mother   . Hypertension Child        4 adult children  . Cervical cancer Other   . Lung cancer Other   . Stroke Other   . Heart attack Neg Hx     Medications:   Current Outpatient Medications on File Prior to Visit   Medication Sig Dispense Refill  . acetaminophen (TYLENOL) 500 MG tablet Take 1,000 mg by mouth 2 (two) times daily.     [name] amLODipine (NORVASC) 2.5 MG tablet Take 1 tablet (2.5 mg total) by mouth daily. 90 tablet 1  . atorvastatin (LIPITOR) 10 MG tablet Take 1 tablet (10 mg total) by mouth daily at 6 PM. 90 tablet 1  . b complex vitamins tablet Take 1 tablet by mouth daily.     . Ca Phosphate-Cholecalciferol (CALCIUM/VITAMIN D3 GUMMIES) 250-350 MG-UNIT CHEW Chew 1 Dose by mouth 2 (two) times daily. (Patient taking differently: Chew 1 tablet by mouth 2 (two) times daily. ) 180 tablet 3  . carbidopa-levodopa (SINEMET IR) 25-100 MG tablet Take 1 tablet by mouth 4 (four) times daily. 120 tablet 2  . fish oil-omega-3 fatty acids 1000 MG capsule Take 2 g by mouth daily.     [name] loratadine (CLARITIN) 10 MG tablet Take 10 mg by mouth daily.    . memantine (NAMENDA XR) 28 MG CP24 24 hr capsule TAKE 1 CAPSULE BY MOUTH EVERY DAY IN THE MORNING (Patient taking differently: Take 28 mg by mouth daily. ) 30 capsule 11  . Multiple Vitamins-Minerals (MULTIVITAMIN WITH MINERALS) tablet Take 1 tablet by mouth daily.     . polyethylene glycol (MIRALAX / GLYCOLAX) packet Take 17 g by mouth daily as needed (constipation).    . potassium chloride SA (KLOR-CON M20) 20 MEQ tablet Take 2 tablets (40 mEq total) by mouth daily. 180 tablet 1  . senna (SENOKOT) 8.6 MG TABS tablet Take 1 tablet (8.6 mg total) by mouth daily as needed for mild constipation. 120 each 0  . warfarin (COUMADIN) 5 MG tablet Take 0.5-1 tablets (2.5-5 mg total) by mouth See admin instructions. Take one tablet (5 mg) by mouth on Monday, Wednesday, Friday at 6 pm, take 1/2 tablet (2.5 mg) on Sunday, Tuesday, Thursday, Saturday at 6pm    . warfarin (COUMADIN) 5 MG tablet TAKE 1/2 TABLET BY MOUTH EVERY TUE, THU, SAT AND 1 TABLET BY MOUTH ALL OTHER DAYS 90 tablet 1   No current facility-administered medications on file prior to visit.     Allergies:     Allergies  Allergen Reactions  . Aricept [Donepezil Hcl]  Other (See Comments)    Symptomatic Bradycardia.  . Shellfish Allergy Other (See Comments)    Causes gout Causes a flare up with Gout     Physical Exam  Vitals:   07/13/18 1553  BP: 114/77  Pulse: (!) 57   There is no height or weight on file to calculate BMI. No exam data present  General: well developed, well nourished, pleasant elderly African-American male, seated, in no evident distress Head: head normocephalic and atraumatic.   Neck: supple with no carotid or supraclavicular bruits Cardiovascular: regular rate and rhythm, no murmurs Musculoskeletal: no deformity Skin:  no rash/petichiae Vascular:  Normal pulses all extremities  Neurologic Exam Mental Status: Awake and fully alert.  Limited speech due to combination of dementia and Parkinson's but did state his name and current city where we are located.  Unable to assess further cognition. Cranial Nerves: Fundoscopic exam deferred due to uncooperation of patient. Pupils equal, briskly reactive to light. Extraocular movements full without nystagmus. Visual fields full to confrontation. Hearing intact. Facial sensation intact. Face, tongue, palate moves normally and symmetrically.  Motor: Normal bulk and tone. Normal strength in all tested extremity muscles.  Sensory.: intact to touch , pinprick , position and vibratory sensation.  Coordination: Unable to assess rapid alternating movements or finger-to-nose or heel-to-shin due to uncooperation of patient Gait and Station: Patient currently sitting in wheelchair.  He will stand for transfers but otherwise he is nonambulatory. Reflexes: 1+ and symmetric. Toes downgoing.    NIHSS  UTA Modified Rankin  5    Diagnostic Data (Labs, Imaging, Testing)  Ct Head Wo Contrast 05/05/2018 1. Small focus of hemorrhage in the right posterior fossa, likely intraparenchymal within the cerebellum.No associated mass effect.   2. Severe chronic ischemic microangiopathy.   MRI Brain Wo Contrast 05/06/2018 1. Subacute hematoma right cerebellum with methemoglobin formation and surrounding edema. 2. Advanced atrophy and advanced chronic microvascular ischemia 3. Enlarged pituitary 13 mm consistent with adenoma.  Ct Head Wo Contrast 05/06/2018 Stable appearance of focal hyperdensity in the right posterior fossa. This could represent a cerebellar hemorrhage or calcified meningioma. No progression.   Dg Chest 2 View 05/05/2018 Stable elevation of left hemidiaphragm. Mild bibasilar atelectasis. No edema or consolidation. Stable cardiac silhouette.    ASSESSMENT: [name] [name] is a 84 y.o. year old male here with right cerebellar ICH on 05/05/2018 secondary to warfarin associated coagulopathy. Vascular risk factors include CAD, HTN, HLD, dementia, PE on warfarin, advanced Parkinson's, and CHF.  Patient is being seen today for hospital follow-up and does continue to have worsening dysphagia but otherwise has been stable.    PLAN: -Continue warfarin daily  and Lipitor for secondary stroke prevention -F/u with PCP regarding your HLD and HTN management -Continue Namenda and Sinemet as prescribed -Advised daughter that if she changes current programs where she is able to obtain therapy, to notify us and orders can be placed -continue to monitor BP at home -advised to continue to stay active and maintain a healthy diet -Maintain strict control of hypertension with blood pressure goal below 130/90, diabetes with hemoglobin A1c goal below 6.5% and cholesterol with LDL cholesterol (bad cholesterol) goal below 70 mg/dL. I also advised the patient to eat a healthy diet with plenty of whole grains, cereals, fruits and vegetables, exercise regularly and maintain ideal body weight.  Follow up in 6 months or call earlier if needed   Greater than 50% of time during this 25 minute visit was spent on counseling,explanation of  diagnosis of right cerebellar ICH, reviewing risk factor management of CAD, HTN, HLD, dementia, PE on warfarin, advanced Parkinson's and CHF, planning of further management, discussion with patient and family and coordination of care    Venancio Poisson, AGNP-BC  [facility] [address] [place], [place] [address]  Phone [phone] Fax [phone] Note: This document was prepared with digital dictation and possible smart phrase technology. Any transcriptional errors that result from this process are unintentional.

## 2018-07-14 NOTE — Progress Notes (Signed)
I agree with the above plan 

## 2018-07-21 DIAGNOSIS — R269 Unspecified abnormalities of gait and mobility: Secondary | ICD-10-CM | POA: Diagnosis not present

## 2018-07-21 DIAGNOSIS — I110 Hypertensive heart disease with heart failure: Secondary | ICD-10-CM | POA: Diagnosis not present

## 2018-07-21 DIAGNOSIS — R32 Unspecified urinary incontinence: Secondary | ICD-10-CM | POA: Diagnosis not present

## 2018-07-21 DIAGNOSIS — R531 Weakness: Secondary | ICD-10-CM | POA: Diagnosis not present

## 2018-07-21 DIAGNOSIS — G20 Parkinson's disease: Secondary | ICD-10-CM | POA: Diagnosis not present

## 2018-07-21 DIAGNOSIS — M1A00X0 Idiopathic chronic gout, unspecified site, without tophus (tophi): Secondary | ICD-10-CM | POA: Diagnosis not present

## 2018-07-21 DIAGNOSIS — I509 Heart failure, unspecified: Secondary | ICD-10-CM | POA: Diagnosis not present

## 2018-07-21 DIAGNOSIS — R159 Full incontinence of feces: Secondary | ICD-10-CM | POA: Diagnosis not present

## 2018-07-21 DIAGNOSIS — Z515 Encounter for palliative care: Secondary | ICD-10-CM | POA: Diagnosis not present

## 2018-07-21 DIAGNOSIS — R001 Bradycardia, unspecified: Secondary | ICD-10-CM | POA: Diagnosis not present

## 2018-07-21 DIAGNOSIS — F0280 Dementia in other diseases classified elsewhere without behavioral disturbance: Secondary | ICD-10-CM | POA: Diagnosis not present

## 2018-07-24 DIAGNOSIS — R531 Weakness: Secondary | ICD-10-CM | POA: Diagnosis not present

## 2018-07-24 DIAGNOSIS — F0280 Dementia in other diseases classified elsewhere without behavioral disturbance: Secondary | ICD-10-CM | POA: Diagnosis not present

## 2018-07-24 DIAGNOSIS — R001 Bradycardia, unspecified: Secondary | ICD-10-CM | POA: Diagnosis not present

## 2018-07-24 DIAGNOSIS — I509 Heart failure, unspecified: Secondary | ICD-10-CM | POA: Diagnosis not present

## 2018-07-24 DIAGNOSIS — G20 Parkinson's disease: Secondary | ICD-10-CM | POA: Diagnosis not present

## 2018-07-24 DIAGNOSIS — I110 Hypertensive heart disease with heart failure: Secondary | ICD-10-CM | POA: Diagnosis not present

## 2018-07-25 DIAGNOSIS — F0280 Dementia in other diseases classified elsewhere without behavioral disturbance: Secondary | ICD-10-CM | POA: Diagnosis not present

## 2018-07-25 DIAGNOSIS — G20 Parkinson's disease: Secondary | ICD-10-CM | POA: Diagnosis not present

## 2018-07-25 DIAGNOSIS — R001 Bradycardia, unspecified: Secondary | ICD-10-CM | POA: Diagnosis not present

## 2018-07-25 DIAGNOSIS — R531 Weakness: Secondary | ICD-10-CM | POA: Diagnosis not present

## 2018-07-25 DIAGNOSIS — I509 Heart failure, unspecified: Secondary | ICD-10-CM | POA: Diagnosis not present

## 2018-07-25 DIAGNOSIS — I110 Hypertensive heart disease with heart failure: Secondary | ICD-10-CM | POA: Diagnosis not present

## 2018-07-26 DIAGNOSIS — G20 Parkinson's disease: Secondary | ICD-10-CM | POA: Diagnosis not present

## 2018-07-26 DIAGNOSIS — R531 Weakness: Secondary | ICD-10-CM | POA: Diagnosis not present

## 2018-07-26 DIAGNOSIS — I110 Hypertensive heart disease with heart failure: Secondary | ICD-10-CM | POA: Diagnosis not present

## 2018-07-26 DIAGNOSIS — R001 Bradycardia, unspecified: Secondary | ICD-10-CM | POA: Diagnosis not present

## 2018-07-26 DIAGNOSIS — I509 Heart failure, unspecified: Secondary | ICD-10-CM | POA: Diagnosis not present

## 2018-07-26 DIAGNOSIS — F0280 Dementia in other diseases classified elsewhere without behavioral disturbance: Secondary | ICD-10-CM | POA: Diagnosis not present

## 2018-07-28 DIAGNOSIS — I509 Heart failure, unspecified: Secondary | ICD-10-CM | POA: Diagnosis not present

## 2018-07-28 DIAGNOSIS — G20 Parkinson's disease: Secondary | ICD-10-CM | POA: Diagnosis not present

## 2018-07-28 DIAGNOSIS — R001 Bradycardia, unspecified: Secondary | ICD-10-CM | POA: Diagnosis not present

## 2018-07-28 DIAGNOSIS — F0280 Dementia in other diseases classified elsewhere without behavioral disturbance: Secondary | ICD-10-CM | POA: Diagnosis not present

## 2018-07-28 DIAGNOSIS — R531 Weakness: Secondary | ICD-10-CM | POA: Diagnosis not present

## 2018-07-28 DIAGNOSIS — I110 Hypertensive heart disease with heart failure: Secondary | ICD-10-CM | POA: Diagnosis not present

## 2018-07-31 ENCOUNTER — Ambulatory Visit: Payer: Medicare Other

## 2018-07-31 DIAGNOSIS — I509 Heart failure, unspecified: Secondary | ICD-10-CM | POA: Diagnosis not present

## 2018-07-31 DIAGNOSIS — R001 Bradycardia, unspecified: Secondary | ICD-10-CM | POA: Diagnosis not present

## 2018-07-31 DIAGNOSIS — I110 Hypertensive heart disease with heart failure: Secondary | ICD-10-CM | POA: Diagnosis not present

## 2018-07-31 DIAGNOSIS — F0280 Dementia in other diseases classified elsewhere without behavioral disturbance: Secondary | ICD-10-CM | POA: Diagnosis not present

## 2018-07-31 DIAGNOSIS — G20 Parkinson's disease: Secondary | ICD-10-CM | POA: Diagnosis not present

## 2018-07-31 DIAGNOSIS — R531 Weakness: Secondary | ICD-10-CM | POA: Diagnosis not present

## 2018-08-01 DIAGNOSIS — I509 Heart failure, unspecified: Secondary | ICD-10-CM | POA: Diagnosis not present

## 2018-08-01 DIAGNOSIS — R531 Weakness: Secondary | ICD-10-CM | POA: Diagnosis not present

## 2018-08-01 DIAGNOSIS — I110 Hypertensive heart disease with heart failure: Secondary | ICD-10-CM | POA: Diagnosis not present

## 2018-08-01 DIAGNOSIS — F0280 Dementia in other diseases classified elsewhere without behavioral disturbance: Secondary | ICD-10-CM | POA: Diagnosis not present

## 2018-08-01 DIAGNOSIS — G20 Parkinson's disease: Secondary | ICD-10-CM | POA: Diagnosis not present

## 2018-08-01 DIAGNOSIS — R001 Bradycardia, unspecified: Secondary | ICD-10-CM | POA: Diagnosis not present

## 2018-08-02 DIAGNOSIS — R531 Weakness: Secondary | ICD-10-CM | POA: Diagnosis not present

## 2018-08-02 DIAGNOSIS — I509 Heart failure, unspecified: Secondary | ICD-10-CM | POA: Diagnosis not present

## 2018-08-02 DIAGNOSIS — G20 Parkinson's disease: Secondary | ICD-10-CM | POA: Diagnosis not present

## 2018-08-02 DIAGNOSIS — F0280 Dementia in other diseases classified elsewhere without behavioral disturbance: Secondary | ICD-10-CM | POA: Diagnosis not present

## 2018-08-02 DIAGNOSIS — I110 Hypertensive heart disease with heart failure: Secondary | ICD-10-CM | POA: Diagnosis not present

## 2018-08-02 DIAGNOSIS — R001 Bradycardia, unspecified: Secondary | ICD-10-CM | POA: Diagnosis not present

## 2018-08-03 DIAGNOSIS — R531 Weakness: Secondary | ICD-10-CM | POA: Diagnosis not present

## 2018-08-03 DIAGNOSIS — I509 Heart failure, unspecified: Secondary | ICD-10-CM | POA: Diagnosis not present

## 2018-08-03 DIAGNOSIS — R001 Bradycardia, unspecified: Secondary | ICD-10-CM | POA: Diagnosis not present

## 2018-08-03 DIAGNOSIS — I110 Hypertensive heart disease with heart failure: Secondary | ICD-10-CM | POA: Diagnosis not present

## 2018-08-03 DIAGNOSIS — G20 Parkinson's disease: Secondary | ICD-10-CM | POA: Diagnosis not present

## 2018-08-03 DIAGNOSIS — F0280 Dementia in other diseases classified elsewhere without behavioral disturbance: Secondary | ICD-10-CM | POA: Diagnosis not present

## 2018-08-04 ENCOUNTER — Telehealth: Payer: Self-pay | Admitting: *Deleted

## 2018-08-04 DIAGNOSIS — G20 Parkinson's disease: Secondary | ICD-10-CM | POA: Diagnosis not present

## 2018-08-04 DIAGNOSIS — F0280 Dementia in other diseases classified elsewhere without behavioral disturbance: Secondary | ICD-10-CM | POA: Diagnosis not present

## 2018-08-04 DIAGNOSIS — I110 Hypertensive heart disease with heart failure: Secondary | ICD-10-CM | POA: Diagnosis not present

## 2018-08-04 DIAGNOSIS — R531 Weakness: Secondary | ICD-10-CM | POA: Diagnosis not present

## 2018-08-04 DIAGNOSIS — R001 Bradycardia, unspecified: Secondary | ICD-10-CM | POA: Diagnosis not present

## 2018-08-04 DIAGNOSIS — I509 Heart failure, unspecified: Secondary | ICD-10-CM | POA: Diagnosis not present

## 2018-08-07 ENCOUNTER — Other Ambulatory Visit: Payer: Self-pay

## 2018-08-07 ENCOUNTER — Ambulatory Visit (INDEPENDENT_AMBULATORY_CARE_PROVIDER_SITE_OTHER): Payer: Medicare Other | Admitting: Pharmacist

## 2018-08-07 ENCOUNTER — Ambulatory Visit (INDEPENDENT_AMBULATORY_CARE_PROVIDER_SITE_OTHER): Payer: Medicare Other | Admitting: Internal Medicine

## 2018-08-07 ENCOUNTER — Encounter: Payer: Self-pay | Admitting: Internal Medicine

## 2018-08-07 VITALS — BP 154/111 | HR 71 | Temp 98.6°F

## 2018-08-07 DIAGNOSIS — Z7901 Long term (current) use of anticoagulants: Secondary | ICD-10-CM | POA: Diagnosis not present

## 2018-08-07 DIAGNOSIS — R531 Weakness: Secondary | ICD-10-CM | POA: Diagnosis not present

## 2018-08-07 DIAGNOSIS — Z5181 Encounter for therapeutic drug level monitoring: Secondary | ICD-10-CM

## 2018-08-07 DIAGNOSIS — I509 Heart failure, unspecified: Secondary | ICD-10-CM | POA: Diagnosis not present

## 2018-08-07 DIAGNOSIS — Z86711 Personal history of pulmonary embolism: Secondary | ICD-10-CM

## 2018-08-07 DIAGNOSIS — R58 Hemorrhage, not elsewhere classified: Secondary | ICD-10-CM | POA: Insufficient documentation

## 2018-08-07 DIAGNOSIS — Z7902 Long term (current) use of antithrombotics/antiplatelets: Secondary | ICD-10-CM

## 2018-08-07 DIAGNOSIS — G309 Alzheimer's disease, unspecified: Secondary | ICD-10-CM

## 2018-08-07 DIAGNOSIS — F0280 Dementia in other diseases classified elsewhere without behavioral disturbance: Secondary | ICD-10-CM

## 2018-08-07 DIAGNOSIS — I110 Hypertensive heart disease with heart failure: Secondary | ICD-10-CM | POA: Diagnosis not present

## 2018-08-07 DIAGNOSIS — G20 Parkinson's disease: Secondary | ICD-10-CM

## 2018-08-07 DIAGNOSIS — R001 Bradycardia, unspecified: Secondary | ICD-10-CM | POA: Diagnosis not present

## 2018-08-07 LAB — POCT INR
INR: 2.5 (ref 2.0–3.0)
INR: 2.5 (ref 2.0–3.0)

## 2018-08-07 NOTE — Progress Notes (Signed)
Patient was seen today in a co-visit with Dr. Dorrell 

## 2018-08-07 NOTE — Assessment & Plan Note (Addendum)
The patient's daughter reports that she and his other caregivers noticed a dark area on the patient's buttocks a few weeks ago. This area has lightened in color, but has spread in size. The daughter reports that the patient is at his baseline mentally and physically. The area does not seem to be bothering the patient. He cannot ambulate independently. He either sits in his wheelchair or lays in bed. They ambulate him 2-3 times per day and stand him up periodically throughout the day. He is also turned when in bed, but he prefers to sleep on his back. He wears depends and is fastidiously cleaned by his home health aids. They deny any falls or trauma to the back. He is on warfarin for history of PE.  Assessment: On exam there is a large ecchymosis on the buttocks surrounding the gluteal fold bilaterally without tenderness, erythema, or skin tears. This area appears consistent with a resolving ecchymosis. It has no features of a deep tissue wound or sacral ulcer. There may also be some chronic skin changes and hyperpigmentation due to his dependent positioning. He has a history of sacral ulcer at this site, but this has resolved and there are no signs of recurrence today. He has no other signs or symptoms of bleeding. INR is therapeutic today at 2.5. Last Hb was 13 two months ago. I advised his daughter and CNA that no intervention is required at this time. I counseled them to return to return to clinic if they notice the area has deepened in color, becomes tender to the patient, or develops breaks in the skin.   Plan 1. Continue off-loading the sacral area with walks, standing, and bed turning 2. Continue observation of the lesion by family to ensure resolution 3. Reassess if skin changes progress

## 2018-08-07 NOTE — Patient Instructions (Signed)
It was a pleasure meeting you today!  Please continue to monitor the skin changes on the back. This is most likely a bruise in combination with chronic skin changes from his positioning.  Continue to: 1. Walk [name] [name] two to three times per day.  2. Reposition him in the bed every 2-3 hours. 3. Watch for changes. Please return to the clinic if you notice darkening color, larger area of color change, or any open wounds.   Feel free to call our clinic if you have any questions at [phone].  Thanks, Dr. Annie Paras

## 2018-08-07 NOTE — Progress Notes (Signed)
Anticoagulation Management [name] [name] is a 84 y.o. male who reports to the clinic for monitoring of warfarin treatment.    Indication: PE, history of; History of multiple venous embolic events; Long term current use of anticoagulant.   Duration: indefinite Supervising physician: Joni Reining, MD  Anticoagulation Clinic Visit History: Patient does not report signs/symptoms of bleeding or thromboembolism  Other recent changes: No diet, medications, lifestyle changes endorsed at this visit.   Anticoagulation Episode Summary    Current INR goal:   2.0-3.0  TTR:   74.0 % (7.5 y)  Next INR check:   08/28/2018  INR from last check:   2.5 (08/07/2018)  Weekly max warfarin dose:     Target end date:   Indefinite  INR check location:   Anticoagulation Clinic  Preferred lab:     Send INR reminders to:   ANTICOAG [facility]   Indications   History of pulmonary embolism [Z86.711] Encounter for long-term use of antiplatelets/antithrombotics [Z79.02]       Comments:   History of multiple venous embolic episodes. Will continue to annual re-evaluate continued need for warfarin weighing risks vs. benefits.       Anticoagulation Care Providers    Provider Role Specialty Phone number   Bertha Stakes, MD  Internal Medicine [phone]     Allergies  Allergen Reactions  . Aricept [Donepezil Hcl] Other (See Comments)    Symptomatic Bradycardia.  . Shellfish Allergy Other (See Comments)    Causes gout Causes a flare up with Gout   Medication Sig  acetaminophen (TYLENOL) 500 MG tablet Take 1,000 mg by mouth 2 (two) times daily.   amLODipine (NORVASC) 2.5 MG tablet Take 1 tablet (2.5 mg total) by mouth daily.  atorvastatin (LIPITOR) 10 MG tablet Take 1 tablet (10 mg total) by mouth daily at 6 PM.  b complex vitamins tablet Take 1 tablet by mouth daily.   Ca Phosphate-Cholecalciferol (CALCIUM/VITAMIN D3 GUMMIES) 250-350 MG-UNIT CHEW Chew 1 Dose by mouth 2 (two) times daily. Patient taking  differently: Chew 1 tablet by mouth 2 (two) times daily.   carbidopa-levodopa (SINEMET IR) 25-100 MG tablet Take 1 tablet by mouth 4 (four) times daily.  fish oil-omega-3 fatty acids 1000 MG capsule Take 2 g by mouth daily.   loratadine (CLARITIN) 10 MG tablet Take 10 mg by mouth daily.  memantine (NAMENDA XR) 28 MG CP24 24 hr capsule TAKE 1 CAPSULE BY MOUTH EVERY DAY IN THE MORNING Patient taking differently: Take 28 mg by mouth daily.   Multiple Vitamins-Minerals (MULTIVITAMIN WITH MINERALS) tablet Take 1 tablet by mouth daily.   polyethylene glycol (MIRALAX / GLYCOLAX) packet Take 17 g by mouth daily as needed (constipation).  potassium chloride SA (KLOR-CON M20) 20 MEQ tablet Take 2 tablets (40 mEq total) by mouth daily.  senna (SENOKOT) 8.6 MG TABS tablet Take 1 tablet (8.6 mg total) by mouth daily as needed for mild constipation.  warfarin (COUMADIN) 5 MG tablet Take 0.5-1 tablets (2.5-5 mg total) by mouth See admin instructions. Take one tablet (5 mg) by mouth on Monday, Wednesday, Friday at 6 pm, take 1/2 tablet (2.5 mg) on Sunday, Tuesday, Thursday, Saturday at 6pm  warfarin (COUMADIN) 5 MG tablet TAKE 1/2 TABLET BY MOUTH EVERY TUE, THU, SAT AND 1 TABLET BY MOUTH ALL OTHER DAYS   Past Medical History:  Diagnosis Date  . Anxiety   . Coronary artery disease, non-occlusive    with history of MI; Cath 2008 w multivessel nonobstructive CAD  . Dementia ([place])   .  Depression   . GERD (gastroesophageal reflux disease)   . Hyperlipidemia   . Hypertension   . Memory loss   . Parkinson disease ([place])   . PE (pulmonary embolism)    unprovoked PE completed 6 months of warfarin, warfarin d/ced 02/12/2010, repeat PE 07/10/2010 after a long car ride and now on lifelong coumadin.  . Pituitary microadenoma ([place])    incidental finding CT 12/2009  . Prostate cancer ([place])   . Scoliosis   . Sigmoid volvulus ([place]) 08/02/2016  . Volvulus of colon ([place]) 06/01/2017   Social History   Socioeconomic  History  . Marital status: Married    Spouse name: Not on file  . Number of children: 4  . Years of education: master's  . Highest education level: Not on file  Occupational History  . Occupation: Retired    Fish farm manager: RETIRED  Social Needs  . Financial resource strain: Not on file  . Food insecurity:    Worry: Not on file    Inability: Not on file  . Transportation needs:    Medical: Not on file    Non-medical: Not on file  Tobacco Use  . Smoking status: Never Smoker  . Smokeless tobacco: Never Used  Substance and Sexual Activity  . Alcohol use: No    Alcohol/week: 0.0 standard drinks  . Drug use: No  . Sexual activity: Never  Lifestyle  . Physical activity:    Days per week: Not on file    Minutes per session: Not on file  . Stress: Not on file  Relationships  . Social connections:    Talks on phone: Not on file    Gets together: Not on file    Attends religious service: Not on file    Active member of club or organization: Not on file    Attends meetings of clubs or organizations: Not on file    Relationship status: Not on file  Other Topics Concern  . Not on file  Social History Narrative   Lives with daughter. Wife also has severe dementia.    Drinks 1 cup of coffee a day    Family History  Problem Relation Age of Onset  . Hypertension Father        Passed away from cerebral hemorrhage at age of 60.  [name] Dementia Mother   . Hypertension Child        4 adult children  . Cervical cancer Other   . Lung cancer Other   . Stroke Other   . Heart attack Neg Hx    ASSESSMENT Lab Results  Component Value Date   INR 2.5 08/07/2018   INR 2.5 08/07/2018   INR 2.4 07/03/2018   Anticoagulation Dosing: Description   Give one-half (1/2) of  his 5mg  peach-colored warfarin every day--EXCEPT on [facility] and FRIDAYS--give one (1) tablet of his 5mg  peach-colored warfarin tablets on Wednesdays and Fridays.      INR today: Therapeutic  PLAN Weekly dose was  unchanged  Patient advised to contact clinic or seek medical attention if signs/symptoms of bleeding or thromboembolism occur.  Patient verbalized understanding by repeating back information and was advised to contact me if further medication-related questions arise. Patient was also provided an information handout.  Follow-up 3 weeks  [name]

## 2018-08-07 NOTE — Progress Notes (Signed)
   CC: buttock bruise  HPI:   [name] is a 84 y.o. male with a medical history of Alzheimer's dementia and Parkinson's disease as well as the other medical conditions listed below who presents to the internal medicine clinic accompanied by his daughter and home-health CNA for evaluation of a buttock bruise. Please see problem based charting for the history and status of the patient's current and chronic medical conditions.   Past Medical History:  Diagnosis Date  . Anxiety   . Coronary artery disease, non-occlusive    with history of MI; Cath 2008 w multivessel nonobstructive CAD  . Dementia ([place])   . Depression   . GERD (gastroesophageal reflux disease)   . Hyperlipidemia   . Hypertension   . Memory loss   . Parkinson disease ([place])   . PE (pulmonary embolism)    unprovoked PE completed 6 months of warfarin, warfarin d/ced 02/12/2010, repeat PE 07/10/2010 after a long car ride and now on lifelong coumadin.  . Pituitary microadenoma ([place])    incidental finding CT 12/2009  . Prostate cancer ([place])   . Scoliosis   . Sigmoid volvulus ([place]) 08/02/2016  . Volvulus of colon ([place]) 06/01/2017    Review of Systems:   Pertinent positives mentioned in HPI. Remainder of all ROS negative.  Physical Exam: Vitals:   08/07/18 1557  BP: (!) 154/111  Pulse: 71  Temp: 98.6 F (37 C)  TempSrc: Oral  SpO2: 100%   Physical Exam  Constitutional: No acute distress. Patient is sitting comfortably in his wheelchair.  Neuro: The patient does not answer questions or follow commands. He is not oriented. This is his baseline per daughter. Cardiovascular: Normal rate and regular rhythm. No murmurs, rubs, or gallops. Pulmonary/Chest: Effort normal. Clear to auscultation bilaterally. No wheezes, rales, or rhonchi. Abdominal: Soft, non-distended, non-tender. Ext: No lower extremity edema. Skin: Warm and dry. There is a large ecchymosis on the buttocks surrounding the gluteal fold bilaterally.  The area does not seem tender to palpation. There is no erythema or skin tears. No other wounds noted.   Assessment & Plan:   See Encounters Tab for problem based charting.  Patient seen with Dr. Daryll Drown

## 2018-08-08 ENCOUNTER — Other Ambulatory Visit: Payer: Self-pay | Admitting: Neurology

## 2018-08-08 DIAGNOSIS — R531 Weakness: Secondary | ICD-10-CM | POA: Diagnosis not present

## 2018-08-08 DIAGNOSIS — R001 Bradycardia, unspecified: Secondary | ICD-10-CM | POA: Diagnosis not present

## 2018-08-08 DIAGNOSIS — I110 Hypertensive heart disease with heart failure: Secondary | ICD-10-CM | POA: Diagnosis not present

## 2018-08-08 DIAGNOSIS — G20 Parkinson's disease: Secondary | ICD-10-CM | POA: Diagnosis not present

## 2018-08-08 DIAGNOSIS — F0280 Dementia in other diseases classified elsewhere without behavioral disturbance: Secondary | ICD-10-CM | POA: Diagnosis not present

## 2018-08-08 DIAGNOSIS — I509 Heart failure, unspecified: Secondary | ICD-10-CM | POA: Diagnosis not present

## 2018-08-09 DIAGNOSIS — R001 Bradycardia, unspecified: Secondary | ICD-10-CM | POA: Diagnosis not present

## 2018-08-09 DIAGNOSIS — I509 Heart failure, unspecified: Secondary | ICD-10-CM | POA: Diagnosis not present

## 2018-08-09 DIAGNOSIS — I110 Hypertensive heart disease with heart failure: Secondary | ICD-10-CM | POA: Diagnosis not present

## 2018-08-09 DIAGNOSIS — F0280 Dementia in other diseases classified elsewhere without behavioral disturbance: Secondary | ICD-10-CM | POA: Diagnosis not present

## 2018-08-09 DIAGNOSIS — R531 Weakness: Secondary | ICD-10-CM | POA: Diagnosis not present

## 2018-08-09 DIAGNOSIS — G20 Parkinson's disease: Secondary | ICD-10-CM | POA: Diagnosis not present

## 2018-08-09 NOTE — Progress Notes (Signed)
Internal Medicine Clinic Attending  I saw and evaluated the patient.  I personally confirmed the key portions of the history and exam documented by Dr. Dorrell and I reviewed pertinent patient test results.  The assessment, diagnosis, and plan were formulated together and I agree with the documentation in the resident's note. 

## 2018-08-10 DIAGNOSIS — I509 Heart failure, unspecified: Secondary | ICD-10-CM | POA: Diagnosis not present

## 2018-08-10 DIAGNOSIS — R001 Bradycardia, unspecified: Secondary | ICD-10-CM | POA: Diagnosis not present

## 2018-08-10 DIAGNOSIS — F0280 Dementia in other diseases classified elsewhere without behavioral disturbance: Secondary | ICD-10-CM | POA: Diagnosis not present

## 2018-08-10 DIAGNOSIS — G20 Parkinson's disease: Secondary | ICD-10-CM | POA: Diagnosis not present

## 2018-08-10 DIAGNOSIS — R531 Weakness: Secondary | ICD-10-CM | POA: Diagnosis not present

## 2018-08-10 DIAGNOSIS — I110 Hypertensive heart disease with heart failure: Secondary | ICD-10-CM | POA: Diagnosis not present

## 2018-08-11 DIAGNOSIS — I110 Hypertensive heart disease with heart failure: Secondary | ICD-10-CM | POA: Diagnosis not present

## 2018-08-11 DIAGNOSIS — R001 Bradycardia, unspecified: Secondary | ICD-10-CM | POA: Diagnosis not present

## 2018-08-11 DIAGNOSIS — I509 Heart failure, unspecified: Secondary | ICD-10-CM | POA: Diagnosis not present

## 2018-08-11 DIAGNOSIS — R531 Weakness: Secondary | ICD-10-CM | POA: Diagnosis not present

## 2018-08-11 DIAGNOSIS — F0280 Dementia in other diseases classified elsewhere without behavioral disturbance: Secondary | ICD-10-CM | POA: Diagnosis not present

## 2018-08-11 DIAGNOSIS — G20 Parkinson's disease: Secondary | ICD-10-CM | POA: Diagnosis not present

## 2018-08-14 DIAGNOSIS — R531 Weakness: Secondary | ICD-10-CM | POA: Diagnosis not present

## 2018-08-14 DIAGNOSIS — I509 Heart failure, unspecified: Secondary | ICD-10-CM | POA: Diagnosis not present

## 2018-08-14 DIAGNOSIS — F0280 Dementia in other diseases classified elsewhere without behavioral disturbance: Secondary | ICD-10-CM | POA: Diagnosis not present

## 2018-08-14 DIAGNOSIS — R001 Bradycardia, unspecified: Secondary | ICD-10-CM | POA: Diagnosis not present

## 2018-08-14 DIAGNOSIS — G20 Parkinson's disease: Secondary | ICD-10-CM | POA: Diagnosis not present

## 2018-08-14 DIAGNOSIS — I110 Hypertensive heart disease with heart failure: Secondary | ICD-10-CM | POA: Diagnosis not present

## 2018-08-15 DIAGNOSIS — I509 Heart failure, unspecified: Secondary | ICD-10-CM | POA: Diagnosis not present

## 2018-08-15 DIAGNOSIS — R531 Weakness: Secondary | ICD-10-CM | POA: Diagnosis not present

## 2018-08-15 DIAGNOSIS — I110 Hypertensive heart disease with heart failure: Secondary | ICD-10-CM | POA: Diagnosis not present

## 2018-08-15 DIAGNOSIS — F0280 Dementia in other diseases classified elsewhere without behavioral disturbance: Secondary | ICD-10-CM | POA: Diagnosis not present

## 2018-08-15 DIAGNOSIS — G20 Parkinson's disease: Secondary | ICD-10-CM | POA: Diagnosis not present

## 2018-08-15 DIAGNOSIS — R001 Bradycardia, unspecified: Secondary | ICD-10-CM | POA: Diagnosis not present

## 2018-08-16 DIAGNOSIS — G20 Parkinson's disease: Secondary | ICD-10-CM | POA: Diagnosis not present

## 2018-08-16 DIAGNOSIS — R531 Weakness: Secondary | ICD-10-CM | POA: Diagnosis not present

## 2018-08-16 DIAGNOSIS — I110 Hypertensive heart disease with heart failure: Secondary | ICD-10-CM | POA: Diagnosis not present

## 2018-08-16 DIAGNOSIS — I509 Heart failure, unspecified: Secondary | ICD-10-CM | POA: Diagnosis not present

## 2018-08-16 DIAGNOSIS — F0280 Dementia in other diseases classified elsewhere without behavioral disturbance: Secondary | ICD-10-CM | POA: Diagnosis not present

## 2018-08-16 DIAGNOSIS — R001 Bradycardia, unspecified: Secondary | ICD-10-CM | POA: Diagnosis not present

## 2018-08-18 DIAGNOSIS — G20 Parkinson's disease: Secondary | ICD-10-CM | POA: Diagnosis not present

## 2018-08-18 DIAGNOSIS — R531 Weakness: Secondary | ICD-10-CM | POA: Diagnosis not present

## 2018-08-18 DIAGNOSIS — R001 Bradycardia, unspecified: Secondary | ICD-10-CM | POA: Diagnosis not present

## 2018-08-18 DIAGNOSIS — I509 Heart failure, unspecified: Secondary | ICD-10-CM | POA: Diagnosis not present

## 2018-08-18 DIAGNOSIS — F0280 Dementia in other diseases classified elsewhere without behavioral disturbance: Secondary | ICD-10-CM | POA: Diagnosis not present

## 2018-08-18 DIAGNOSIS — I110 Hypertensive heart disease with heart failure: Secondary | ICD-10-CM | POA: Diagnosis not present

## 2018-08-20 DIAGNOSIS — R269 Unspecified abnormalities of gait and mobility: Secondary | ICD-10-CM | POA: Diagnosis not present

## 2018-08-20 DIAGNOSIS — R159 Full incontinence of feces: Secondary | ICD-10-CM | POA: Diagnosis not present

## 2018-08-20 DIAGNOSIS — G20 Parkinson's disease: Secondary | ICD-10-CM | POA: Diagnosis not present

## 2018-08-20 DIAGNOSIS — R001 Bradycardia, unspecified: Secondary | ICD-10-CM | POA: Diagnosis not present

## 2018-08-20 DIAGNOSIS — R32 Unspecified urinary incontinence: Secondary | ICD-10-CM | POA: Diagnosis not present

## 2018-08-20 DIAGNOSIS — I509 Heart failure, unspecified: Secondary | ICD-10-CM | POA: Diagnosis not present

## 2018-08-20 DIAGNOSIS — R531 Weakness: Secondary | ICD-10-CM | POA: Diagnosis not present

## 2018-08-20 DIAGNOSIS — Z515 Encounter for palliative care: Secondary | ICD-10-CM | POA: Diagnosis not present

## 2018-08-20 DIAGNOSIS — I110 Hypertensive heart disease with heart failure: Secondary | ICD-10-CM | POA: Diagnosis not present

## 2018-08-20 DIAGNOSIS — M1A00X0 Idiopathic chronic gout, unspecified site, without tophus (tophi): Secondary | ICD-10-CM | POA: Diagnosis not present

## 2018-08-20 DIAGNOSIS — F0280 Dementia in other diseases classified elsewhere without behavioral disturbance: Secondary | ICD-10-CM | POA: Diagnosis not present

## 2018-08-21 DIAGNOSIS — F0280 Dementia in other diseases classified elsewhere without behavioral disturbance: Secondary | ICD-10-CM | POA: Diagnosis not present

## 2018-08-21 DIAGNOSIS — R001 Bradycardia, unspecified: Secondary | ICD-10-CM | POA: Diagnosis not present

## 2018-08-21 DIAGNOSIS — G20 Parkinson's disease: Secondary | ICD-10-CM | POA: Diagnosis not present

## 2018-08-21 DIAGNOSIS — I110 Hypertensive heart disease with heart failure: Secondary | ICD-10-CM | POA: Diagnosis not present

## 2018-08-21 DIAGNOSIS — R531 Weakness: Secondary | ICD-10-CM | POA: Diagnosis not present

## 2018-08-21 DIAGNOSIS — I509 Heart failure, unspecified: Secondary | ICD-10-CM | POA: Diagnosis not present

## 2018-08-22 DIAGNOSIS — G20 Parkinson's disease: Secondary | ICD-10-CM | POA: Diagnosis not present

## 2018-08-22 DIAGNOSIS — I110 Hypertensive heart disease with heart failure: Secondary | ICD-10-CM | POA: Diagnosis not present

## 2018-08-22 DIAGNOSIS — R531 Weakness: Secondary | ICD-10-CM | POA: Diagnosis not present

## 2018-08-22 DIAGNOSIS — R001 Bradycardia, unspecified: Secondary | ICD-10-CM | POA: Diagnosis not present

## 2018-08-22 DIAGNOSIS — F0280 Dementia in other diseases classified elsewhere without behavioral disturbance: Secondary | ICD-10-CM | POA: Diagnosis not present

## 2018-08-22 DIAGNOSIS — I509 Heart failure, unspecified: Secondary | ICD-10-CM | POA: Diagnosis not present

## 2018-08-23 DIAGNOSIS — R001 Bradycardia, unspecified: Secondary | ICD-10-CM | POA: Diagnosis not present

## 2018-08-23 DIAGNOSIS — G20 Parkinson's disease: Secondary | ICD-10-CM | POA: Diagnosis not present

## 2018-08-23 DIAGNOSIS — I509 Heart failure, unspecified: Secondary | ICD-10-CM | POA: Diagnosis not present

## 2018-08-23 DIAGNOSIS — I110 Hypertensive heart disease with heart failure: Secondary | ICD-10-CM | POA: Diagnosis not present

## 2018-08-23 DIAGNOSIS — R531 Weakness: Secondary | ICD-10-CM | POA: Diagnosis not present

## 2018-08-23 DIAGNOSIS — F0280 Dementia in other diseases classified elsewhere without behavioral disturbance: Secondary | ICD-10-CM | POA: Diagnosis not present

## 2018-08-24 DIAGNOSIS — F0280 Dementia in other diseases classified elsewhere without behavioral disturbance: Secondary | ICD-10-CM | POA: Diagnosis not present

## 2018-08-24 DIAGNOSIS — R001 Bradycardia, unspecified: Secondary | ICD-10-CM | POA: Diagnosis not present

## 2018-08-24 DIAGNOSIS — G20 Parkinson's disease: Secondary | ICD-10-CM | POA: Diagnosis not present

## 2018-08-24 DIAGNOSIS — I110 Hypertensive heart disease with heart failure: Secondary | ICD-10-CM | POA: Diagnosis not present

## 2018-08-24 DIAGNOSIS — I509 Heart failure, unspecified: Secondary | ICD-10-CM | POA: Diagnosis not present

## 2018-08-24 DIAGNOSIS — R531 Weakness: Secondary | ICD-10-CM | POA: Diagnosis not present

## 2018-08-24 NOTE — Telephone Encounter (Signed)
Review meds 

## 2018-08-25 DIAGNOSIS — I509 Heart failure, unspecified: Secondary | ICD-10-CM | POA: Diagnosis not present

## 2018-08-25 DIAGNOSIS — I110 Hypertensive heart disease with heart failure: Secondary | ICD-10-CM | POA: Diagnosis not present

## 2018-08-25 DIAGNOSIS — F0280 Dementia in other diseases classified elsewhere without behavioral disturbance: Secondary | ICD-10-CM | POA: Diagnosis not present

## 2018-08-25 DIAGNOSIS — R531 Weakness: Secondary | ICD-10-CM | POA: Diagnosis not present

## 2018-08-25 DIAGNOSIS — G20 Parkinson's disease: Secondary | ICD-10-CM | POA: Diagnosis not present

## 2018-08-25 DIAGNOSIS — R001 Bradycardia, unspecified: Secondary | ICD-10-CM | POA: Diagnosis not present

## 2018-08-28 ENCOUNTER — Other Ambulatory Visit: Payer: Self-pay | Admitting: Internal Medicine

## 2018-08-28 ENCOUNTER — Ambulatory Visit

## 2018-08-28 DIAGNOSIS — R531 Weakness: Secondary | ICD-10-CM | POA: Diagnosis not present

## 2018-08-28 DIAGNOSIS — I110 Hypertensive heart disease with heart failure: Secondary | ICD-10-CM | POA: Diagnosis not present

## 2018-08-28 DIAGNOSIS — G20 Parkinson's disease: Secondary | ICD-10-CM | POA: Diagnosis not present

## 2018-08-28 DIAGNOSIS — F0280 Dementia in other diseases classified elsewhere without behavioral disturbance: Secondary | ICD-10-CM | POA: Diagnosis not present

## 2018-08-28 DIAGNOSIS — R001 Bradycardia, unspecified: Secondary | ICD-10-CM | POA: Diagnosis not present

## 2018-08-28 DIAGNOSIS — I509 Heart failure, unspecified: Secondary | ICD-10-CM | POA: Diagnosis not present

## 2018-08-29 DIAGNOSIS — I110 Hypertensive heart disease with heart failure: Secondary | ICD-10-CM | POA: Diagnosis not present

## 2018-08-29 DIAGNOSIS — R531 Weakness: Secondary | ICD-10-CM | POA: Diagnosis not present

## 2018-08-29 DIAGNOSIS — F0280 Dementia in other diseases classified elsewhere without behavioral disturbance: Secondary | ICD-10-CM | POA: Diagnosis not present

## 2018-08-29 DIAGNOSIS — R001 Bradycardia, unspecified: Secondary | ICD-10-CM | POA: Diagnosis not present

## 2018-08-29 DIAGNOSIS — I509 Heart failure, unspecified: Secondary | ICD-10-CM | POA: Diagnosis not present

## 2018-08-29 DIAGNOSIS — G20 Parkinson's disease: Secondary | ICD-10-CM | POA: Diagnosis not present

## 2018-08-30 DIAGNOSIS — G20 Parkinson's disease: Secondary | ICD-10-CM | POA: Diagnosis not present

## 2018-08-30 DIAGNOSIS — R531 Weakness: Secondary | ICD-10-CM | POA: Diagnosis not present

## 2018-08-30 DIAGNOSIS — I509 Heart failure, unspecified: Secondary | ICD-10-CM | POA: Diagnosis not present

## 2018-08-30 DIAGNOSIS — F0280 Dementia in other diseases classified elsewhere without behavioral disturbance: Secondary | ICD-10-CM | POA: Diagnosis not present

## 2018-08-30 DIAGNOSIS — I110 Hypertensive heart disease with heart failure: Secondary | ICD-10-CM | POA: Diagnosis not present

## 2018-08-30 DIAGNOSIS — R001 Bradycardia, unspecified: Secondary | ICD-10-CM | POA: Diagnosis not present

## 2018-08-31 DIAGNOSIS — I509 Heart failure, unspecified: Secondary | ICD-10-CM | POA: Diagnosis not present

## 2018-08-31 DIAGNOSIS — R001 Bradycardia, unspecified: Secondary | ICD-10-CM | POA: Diagnosis not present

## 2018-08-31 DIAGNOSIS — G20 Parkinson's disease: Secondary | ICD-10-CM | POA: Diagnosis not present

## 2018-08-31 DIAGNOSIS — R531 Weakness: Secondary | ICD-10-CM | POA: Diagnosis not present

## 2018-08-31 DIAGNOSIS — I110 Hypertensive heart disease with heart failure: Secondary | ICD-10-CM | POA: Diagnosis not present

## 2018-08-31 DIAGNOSIS — F0280 Dementia in other diseases classified elsewhere without behavioral disturbance: Secondary | ICD-10-CM | POA: Diagnosis not present

## 2018-09-01 DIAGNOSIS — R001 Bradycardia, unspecified: Secondary | ICD-10-CM | POA: Diagnosis not present

## 2018-09-01 DIAGNOSIS — F0280 Dementia in other diseases classified elsewhere without behavioral disturbance: Secondary | ICD-10-CM | POA: Diagnosis not present

## 2018-09-01 DIAGNOSIS — G20 Parkinson's disease: Secondary | ICD-10-CM | POA: Diagnosis not present

## 2018-09-01 DIAGNOSIS — R531 Weakness: Secondary | ICD-10-CM | POA: Diagnosis not present

## 2018-09-01 DIAGNOSIS — I509 Heart failure, unspecified: Secondary | ICD-10-CM | POA: Diagnosis not present

## 2018-09-01 DIAGNOSIS — I110 Hypertensive heart disease with heart failure: Secondary | ICD-10-CM | POA: Diagnosis not present

## 2018-09-04 ENCOUNTER — Encounter: Payer: Self-pay | Admitting: Pharmacist

## 2018-09-04 ENCOUNTER — Ambulatory Visit (INDEPENDENT_AMBULATORY_CARE_PROVIDER_SITE_OTHER): Payer: Medicare Other | Admitting: Pharmacist

## 2018-09-04 DIAGNOSIS — I509 Heart failure, unspecified: Secondary | ICD-10-CM | POA: Diagnosis not present

## 2018-09-04 DIAGNOSIS — Z5181 Encounter for therapeutic drug level monitoring: Secondary | ICD-10-CM

## 2018-09-04 DIAGNOSIS — R001 Bradycardia, unspecified: Secondary | ICD-10-CM | POA: Diagnosis not present

## 2018-09-04 DIAGNOSIS — G20 Parkinson's disease: Secondary | ICD-10-CM | POA: Diagnosis not present

## 2018-09-04 DIAGNOSIS — Z7902 Long term (current) use of antithrombotics/antiplatelets: Secondary | ICD-10-CM

## 2018-09-04 DIAGNOSIS — Z7901 Long term (current) use of anticoagulants: Secondary | ICD-10-CM

## 2018-09-04 DIAGNOSIS — Z86711 Personal history of pulmonary embolism: Secondary | ICD-10-CM

## 2018-09-04 DIAGNOSIS — R531 Weakness: Secondary | ICD-10-CM | POA: Diagnosis not present

## 2018-09-04 DIAGNOSIS — I110 Hypertensive heart disease with heart failure: Secondary | ICD-10-CM | POA: Diagnosis not present

## 2018-09-04 DIAGNOSIS — F0280 Dementia in other diseases classified elsewhere without behavioral disturbance: Secondary | ICD-10-CM | POA: Diagnosis not present

## 2018-09-04 LAB — POCT INR: INR: 1.9 — AB (ref 2.0–3.0)

## 2018-09-04 NOTE — Progress Notes (Signed)
Anticoagulation Management [name] [name] is a 84 y.o. male who reports to the clinic for monitoring of warfarin treatment.    Indication: PE, history of, long term current use of anticoagulant.   Duration: indefinite Supervising physician: Joni Reining  Anticoagulation Clinic Visit History: Patient does not report signs/symptoms of bleeding or thromboembolism  Other recent changes: No diet, medications, lifestyle changes.  Anticoagulation Episode Summary    Current INR goal:   2.0-3.0  TTR:   74.1 % (7.6 y)  Next INR check:   10/02/2018  INR from last check:   1.9! (09/04/2018)  Weekly max warfarin dose:     Target end date:   Indefinite  INR check location:   Anticoagulation Clinic  Preferred lab:     Send INR reminders to:   [facility]   Indications   History of pulmonary embolism [Z86.711] Encounter for long-term use of antiplatelets/antithrombotics [Z79.02]       Comments:   History of multiple venous embolic episodes. Will continue to annual re-evaluate continued need for warfarin weighing risks vs. benefits.       Anticoagulation Care Providers    Provider Role Specialty Phone number   Bertha Stakes, MD  Internal Medicine [phone]      Allergies  Allergen Reactions  . Aricept [Donepezil Hcl] Other (See Comments)    Symptomatic Bradycardia.  . Shellfish Allergy Other (See Comments)    Causes gout Causes a flare up with Gout   Prior to Admission medications   Medication Sig Start Date End Date Taking? Authorizing Provider  acetaminophen (TYLENOL) 500 MG tablet Take 1,000 mg by mouth 2 (two) times daily.    Yes [provider]  amLODipine (NORVASC) 2.5 MG tablet TAKE 1 TABLET BY MOUTH EVERY DAY 08/29/18  Yes [name], DO  atorvastatin (LIPITOR) 10 MG tablet Take 1 tablet (10 mg total) by mouth daily at 6 PM. 06/19/18  Yes [name], DO  b complex vitamins tablet Take 1 tablet by mouth daily.    Yes [provider]  Ca  Phosphate-Cholecalciferol (CALCIUM/VITAMIN D3 GUMMIES) 250-350 MG-UNIT CHEW Chew 1 Dose by mouth 2 (two) times daily. Patient taking differently: Chew 1 tablet by mouth 2 (two) times daily.  07/08/16  Yes [name], DO  carbidopa-levodopa (SINEMET IR) 25-100 MG tablet Take 1 tablet by mouth 4 (four) times daily. 05/02/18  Yes Garvin Fila, MD  fish oil-omega-3 fatty acids 1000 MG capsule Take 2 g by mouth daily.    Yes [provider]  loratadine (CLARITIN) 10 MG tablet Take 10 mg by mouth daily.   Yes [provider]  memantine (NAMENDA XR) 28 MG CP24 24 hr capsule TAKE 1 CAPSULE BY MOUTH EVERY DAY IN THE MORNING 08/09/18  Yes Garvin Fila, MD  Multiple Vitamins-Minerals (MULTIVITAMIN WITH MINERALS) tablet Take 1 tablet by mouth daily.    Yes [provider]  polyethylene glycol (MIRALAX / GLYCOLAX) packet Take 17 g by mouth daily as needed (constipation).   Yes [provider]  potassium chloride SA (KLOR-CON M20) 20 MEQ tablet Take 2 tablets (40 mEq total) by mouth daily. 06/27/18  Yes [name], DO  senna (SENOKOT) 8.6 MG TABS tablet Take 1 tablet (8.6 mg total) by mouth daily as needed for mild constipation. 07/13/16  Yes Velna Ochs, MD  warfarin (COUMADIN) 5 MG tablet Take 0.5-1 tablets (2.5-5 mg total) by mouth See admin instructions. Take one tablet (5 mg) by mouth on Monday, Wednesday,  Friday at 6 pm, take 1/2 tablet (2.5 mg) on Sunday, Tuesday, Thursday, Saturday at 6pm 05/19/18  Yes Donzetta Starch, NP  warfarin (COUMADIN) 5 MG tablet TAKE 1/2 TABLET BY MOUTH EVERY TUE, THU, SAT AND 1 TABLET BY MOUTH ALL OTHER DAYS 06/16/18  Yes [name], DO   Past Medical History:  Diagnosis Date  . Anxiety   . Coronary artery disease, non-occlusive    with history of MI; Cath 2008 w multivessel nonobstructive CAD  . Dementia ([place])   . Depression   . GERD (gastroesophageal reflux disease)   . Hyperlipidemia   . Hypertension   . Memory  loss   . Parkinson disease ([place])   . PE (pulmonary embolism)    unprovoked PE completed 6 months of warfarin, warfarin d/ced 02/12/2010, repeat PE 07/10/2010 after a long car ride and now on lifelong coumadin.  . Pituitary microadenoma ([place])    incidental finding CT 12/2009  . Prostate cancer ([place])   . Scoliosis   . Sigmoid volvulus ([place]) 08/02/2016  . Volvulus of colon ([place]) 06/01/2017   Social History   Socioeconomic History  . Marital status: Married    Spouse name: Not on file  . Number of children: 4  . Years of education: master's  . Highest education level: Not on file  Occupational History  . Occupation: Retired    Fish farm manager: RETIRED  Social Needs  . Financial resource strain: Not on file  . Food insecurity:    Worry: Not on file    Inability: Not on file  . Transportation needs:    Medical: Not on file    Non-medical: Not on file  Tobacco Use  . Smoking status: Never Smoker  . Smokeless tobacco: Never Used  Substance and Sexual Activity  . Alcohol use: No    Alcohol/week: 0.0 standard drinks  . Drug use: No  . Sexual activity: Never  Lifestyle  . Physical activity:    Days per week: Not on file    Minutes per session: Not on file  . Stress: Not on file  Relationships  . Social connections:    Talks on phone: Not on file    Gets together: Not on file    Attends religious service: Not on file    Active member of club or organization: Not on file    Attends meetings of clubs or organizations: Not on file    Relationship status: Not on file  Other Topics Concern  . Not on file  Social History Narrative   Lives with daughter. Wife also has severe dementia.    Drinks 1 cup of coffee a day    Family History  Problem Relation Age of Onset  . Hypertension Father        Passed away from cerebral hemorrhage at age of 62.  [name] Dementia Mother   . Hypertension Child        4 adult children  . Cervical cancer Other   . Lung cancer Other   . Stroke Other   .  Heart attack Neg Hx     ASSESSMENT Recent Results: The most recent result is correlated with 22.5 mg per week: Lab Results  Component Value Date   INR 1.9 (A) 09/04/2018   INR 2.5 08/07/2018   INR 2.5 08/07/2018    Anticoagulation Dosing: Description   Give one-half (1/2) of  his 5mg  peach-colored warfarin every day--EXCEPT on MONDAYS, WEDNESDAYS and FRIDAYS--give one (1) tablet of his 5mg  peach-colored  warfarin tablets on Mondays, Wednesdays and Fridays.      INR today: Subtherapeutic  PLAN Weekly dose was increased by 10% to 25 mg per week  Patient Instructions  Patient instructed to take medications as defined in the Anti-coagulation Track section of this encounter.  Patient instructed to take today's dose.  Patient instructed to take one-half (1/2) of  his 5mg  peach-colored warfarin every day--EXCEPT on MONDAYS, WEDNESDAYS and FRIDAYS--give one (1) tablet of his 5mg  peach-colored warfarin tablets on Mondays, Wednesdays and Fridays.  Patient verbalized understanding of these instructions.     Patient advised to contact clinic or seek medical attention if signs/symptoms of bleeding or thromboembolism occur.  Patient verbalized understanding by repeating back information and was advised to contact me if further medication-related questions arise. Patient was also provided an information handout.  Follow-up Return in 4 weeks (on 10/02/2018) for Follow up INR at 1115h.  Pennie Banter, PharmD, CPP  15 minutes spent face-to-face with the patient during the encounter. 50% of time spent on education. 50% of time was spent on fingerstick point of care INR sample collection, processing, results determination, dose adjustment and documentation in http://www.kim.net/.

## 2018-09-04 NOTE — Patient Instructions (Signed)
Patient instructed to take medications as defined in the Anti-coagulation Track section of this encounter.  Patient instructed to take today's dose.  Patient instructed to take one-half (1/2) of  his 5mg  peach-colored warfarin every day--EXCEPT on MONDAYS, WEDNESDAYS and FRIDAYS--give one (1) tablet of his 5mg  peach-colored warfarin tablets on Mondays, Wednesdays and Fridays.  Patient verbalized understanding of these instructions.

## 2018-09-05 DIAGNOSIS — G20 Parkinson's disease: Secondary | ICD-10-CM | POA: Diagnosis not present

## 2018-09-05 DIAGNOSIS — F0280 Dementia in other diseases classified elsewhere without behavioral disturbance: Secondary | ICD-10-CM | POA: Diagnosis not present

## 2018-09-05 DIAGNOSIS — I509 Heart failure, unspecified: Secondary | ICD-10-CM | POA: Diagnosis not present

## 2018-09-05 DIAGNOSIS — R531 Weakness: Secondary | ICD-10-CM | POA: Diagnosis not present

## 2018-09-05 DIAGNOSIS — R001 Bradycardia, unspecified: Secondary | ICD-10-CM | POA: Diagnosis not present

## 2018-09-05 DIAGNOSIS — I110 Hypertensive heart disease with heart failure: Secondary | ICD-10-CM | POA: Diagnosis not present

## 2018-09-06 DIAGNOSIS — I509 Heart failure, unspecified: Secondary | ICD-10-CM | POA: Diagnosis not present

## 2018-09-06 DIAGNOSIS — G20 Parkinson's disease: Secondary | ICD-10-CM | POA: Diagnosis not present

## 2018-09-06 DIAGNOSIS — R531 Weakness: Secondary | ICD-10-CM | POA: Diagnosis not present

## 2018-09-06 DIAGNOSIS — F0280 Dementia in other diseases classified elsewhere without behavioral disturbance: Secondary | ICD-10-CM | POA: Diagnosis not present

## 2018-09-06 DIAGNOSIS — R001 Bradycardia, unspecified: Secondary | ICD-10-CM | POA: Diagnosis not present

## 2018-09-06 DIAGNOSIS — I110 Hypertensive heart disease with heart failure: Secondary | ICD-10-CM | POA: Diagnosis not present

## 2018-09-07 DIAGNOSIS — F0280 Dementia in other diseases classified elsewhere without behavioral disturbance: Secondary | ICD-10-CM | POA: Diagnosis not present

## 2018-09-07 DIAGNOSIS — R531 Weakness: Secondary | ICD-10-CM | POA: Diagnosis not present

## 2018-09-07 DIAGNOSIS — G20 Parkinson's disease: Secondary | ICD-10-CM | POA: Diagnosis not present

## 2018-09-07 DIAGNOSIS — I110 Hypertensive heart disease with heart failure: Secondary | ICD-10-CM | POA: Diagnosis not present

## 2018-09-07 DIAGNOSIS — R001 Bradycardia, unspecified: Secondary | ICD-10-CM | POA: Diagnosis not present

## 2018-09-07 DIAGNOSIS — I509 Heart failure, unspecified: Secondary | ICD-10-CM | POA: Diagnosis not present

## 2018-09-08 DIAGNOSIS — F0280 Dementia in other diseases classified elsewhere without behavioral disturbance: Secondary | ICD-10-CM | POA: Diagnosis not present

## 2018-09-08 DIAGNOSIS — R531 Weakness: Secondary | ICD-10-CM | POA: Diagnosis not present

## 2018-09-08 DIAGNOSIS — R001 Bradycardia, unspecified: Secondary | ICD-10-CM | POA: Diagnosis not present

## 2018-09-08 DIAGNOSIS — I110 Hypertensive heart disease with heart failure: Secondary | ICD-10-CM | POA: Diagnosis not present

## 2018-09-08 DIAGNOSIS — I509 Heart failure, unspecified: Secondary | ICD-10-CM | POA: Diagnosis not present

## 2018-09-08 DIAGNOSIS — G20 Parkinson's disease: Secondary | ICD-10-CM | POA: Diagnosis not present

## 2018-09-09 DIAGNOSIS — G20 Parkinson's disease: Secondary | ICD-10-CM | POA: Diagnosis not present

## 2018-09-09 DIAGNOSIS — I110 Hypertensive heart disease with heart failure: Secondary | ICD-10-CM | POA: Diagnosis not present

## 2018-09-09 DIAGNOSIS — R531 Weakness: Secondary | ICD-10-CM | POA: Diagnosis not present

## 2018-09-09 DIAGNOSIS — R001 Bradycardia, unspecified: Secondary | ICD-10-CM | POA: Diagnosis not present

## 2018-09-09 DIAGNOSIS — F0280 Dementia in other diseases classified elsewhere without behavioral disturbance: Secondary | ICD-10-CM | POA: Diagnosis not present

## 2018-09-09 DIAGNOSIS — I509 Heart failure, unspecified: Secondary | ICD-10-CM | POA: Diagnosis not present

## 2018-09-10 ENCOUNTER — Encounter (HOSPITAL_COMMUNITY): Payer: Self-pay

## 2018-09-10 ENCOUNTER — Emergency Department (HOSPITAL_COMMUNITY): Payer: Medicare Other

## 2018-09-10 ENCOUNTER — Emergency Department (HOSPITAL_COMMUNITY)
Admission: EM | Admit: 2018-09-10 | Discharge: 2018-09-10 | Disposition: A | Discharge status: Home / Self Care | Payer: Medicare Other | Attending: Emergency Medicine | Admitting: Emergency Medicine

## 2018-09-10 DIAGNOSIS — G309 Alzheimer's disease, unspecified: Secondary | ICD-10-CM | POA: Diagnosis not present

## 2018-09-10 DIAGNOSIS — Z01811 Encounter for preprocedural respiratory examination: Secondary | ICD-10-CM | POA: Diagnosis not present

## 2018-09-10 DIAGNOSIS — I2510 Atherosclerotic heart disease of native coronary artery without angina pectoris: Secondary | ICD-10-CM | POA: Insufficient documentation

## 2018-09-10 DIAGNOSIS — Z79899 Other long term (current) drug therapy: Secondary | ICD-10-CM | POA: Insufficient documentation

## 2018-09-10 DIAGNOSIS — D649 Anemia, unspecified: Secondary | ICD-10-CM | POA: Diagnosis not present

## 2018-09-10 DIAGNOSIS — F0280 Dementia in other diseases classified elsewhere without behavioral disturbance: Secondary | ICD-10-CM | POA: Diagnosis not present

## 2018-09-10 DIAGNOSIS — G20 Parkinson's disease: Secondary | ICD-10-CM | POA: Insufficient documentation

## 2018-09-10 DIAGNOSIS — I5022 Chronic systolic (congestive) heart failure: Secondary | ICD-10-CM | POA: Diagnosis not present

## 2018-09-10 DIAGNOSIS — R4182 Altered mental status, unspecified: Secondary | ICD-10-CM | POA: Diagnosis not present

## 2018-09-10 DIAGNOSIS — I110 Hypertensive heart disease with heart failure: Secondary | ICD-10-CM | POA: Diagnosis not present

## 2018-09-10 DIAGNOSIS — R05 Cough: Secondary | ICD-10-CM | POA: Diagnosis not present

## 2018-09-10 DIAGNOSIS — Z0000 Encounter for general adult medical examination without abnormal findings: Secondary | ICD-10-CM

## 2018-09-10 DIAGNOSIS — R404 Transient alteration of awareness: Secondary | ICD-10-CM | POA: Diagnosis not present

## 2018-09-10 DIAGNOSIS — R0902 Hypoxemia: Secondary | ICD-10-CM | POA: Diagnosis not present

## 2018-09-10 DIAGNOSIS — Z7901 Long term (current) use of anticoagulants: Secondary | ICD-10-CM | POA: Diagnosis not present

## 2018-09-10 HISTORY — DX: Cerebral infarction, unspecified: I63.9

## 2018-09-10 LAB — URINALYSIS, ROUTINE W REFLEX MICROSCOPIC
Bacteria, UA: NONE SEEN
Bilirubin Urine: NEGATIVE
Glucose, UA: NEGATIVE mg/dL
Hgb urine dipstick: NEGATIVE
Ketones, ur: 5 mg/dL — AB
Nitrite: NEGATIVE
Protein, ur: NEGATIVE mg/dL
Specific Gravity, Urine: 1.019 (ref 1.005–1.030)
pH: 5 (ref 5.0–8.0)

## 2018-09-10 LAB — CBC WITH DIFFERENTIAL/PLATELET
Abs Immature Granulocytes: 0 10*3/uL (ref 0.00–0.07)
Basophils Absolute: 0 10*3/uL (ref 0.0–0.1)
Basophils Relative: 1 %
Eosinophils Absolute: 0.2 10*3/uL (ref 0.0–0.5)
Eosinophils Relative: 4 %
HCT: 38.6 % — ABNORMAL LOW (ref 39.0–52.0)
Hemoglobin: 11.8 g/dL — ABNORMAL LOW (ref 13.0–17.0)
Immature Granulocytes: 0 %
Lymphocytes Relative: 31 %
Lymphs Abs: 1.1 10*3/uL (ref 0.7–4.0)
MCH: 30.1 pg (ref 26.0–34.0)
MCHC: 30.6 g/dL (ref 30.0–36.0)
MCV: 98.5 fL (ref 80.0–100.0)
Monocytes Absolute: 0.4 10*3/uL (ref 0.1–1.0)
Monocytes Relative: 12 %
Neutro Abs: 1.9 10*3/uL (ref 1.7–7.7)
Neutrophils Relative %: 52 %
PLATELETS: 167 10*3/uL (ref 150–400)
RBC: 3.92 MIL/uL — ABNORMAL LOW (ref 4.22–5.81)
RDW: 17.2 % — ABNORMAL HIGH (ref 11.5–15.5)
WBC: 3.6 10*3/uL — ABNORMAL LOW (ref 4.0–10.5)
nRBC: 0 % (ref 0.0–0.2)

## 2018-09-10 LAB — COMPREHENSIVE METABOLIC PANEL
ALK PHOS: 75 U/L (ref 38–126)
ALT: 15 U/L (ref 0–44)
AST: 52 U/L — AB (ref 15–41)
Albumin: 3.1 g/dL — ABNORMAL LOW (ref 3.5–5.0)
Anion gap: 7 (ref 5–15)
BILIRUBIN TOTAL: 0.8 mg/dL (ref 0.3–1.2)
BUN: 12 mg/dL (ref 8–23)
CALCIUM: 8.5 mg/dL — AB (ref 8.9–10.3)
CO2: 24 mmol/L (ref 22–32)
CREATININE: 0.66 mg/dL (ref 0.61–1.24)
Chloride: 108 mmol/L (ref 98–111)
GFR calc Af Amer: >60 mL/min (ref >60)
GLUCOSE: 82 mg/dL (ref 70–99)
Potassium: 4.2 mmol/L (ref 3.5–5.1)
Sodium: 139 mmol/L (ref 135–145)
TOTAL PROTEIN: 6.3 g/dL — AB (ref 6.5–8.1)

## 2018-09-10 LAB — PROTIME-INR
INR: 2.07
Prothrombin Time: 23 s — ABNORMAL HIGH (ref 11.4–15.2)

## 2018-09-10 NOTE — ED Notes (Signed)
Bed: WA09 Expected date:  Expected time:  Means of arrival:  Comments: 84 yo M/possible aspiration

## 2018-09-10 NOTE — ED Notes (Signed)
Urine culture sent down to the lab. 

## 2018-09-10 NOTE — Discharge Instructions (Addendum)
Examination today did not show evidence of aspiration pneumonia or infection.  Please follow-up with your primary care provider, return if symptoms are getting worse.

## 2018-09-10 NOTE — ED Triage Notes (Signed)
Pt arrived from home with concerns of aspiration pneumonia. Per family he problems after drinking water yesterday, has been coughing since.

## 2018-09-10 NOTE — ED Provider Notes (Addendum)
[facility] DEPT Provider Note   CSN: [id number] Arrival date & time: 09/10/18  0225     History   Chief Complaint Chief Complaint  Patient presents with  . Cough    Possible aspiration     HPI [name] [name] is a 84 y.o. male.  The history is provided by a relative. The history is limited by the condition of the patient (Dementia, mental status change).  Cough   He has history of hypertension, hyperlipidemia, stroke, dementia, coronary artery disease, and is anticoagulated on warfarin because of history of pulmonary embolism is brought in by family member because of concern for aspiration.  He has been coughing and has been having difficulty with swallowing liquids.  He is also been slightly less conversant than normal.  He does have history of intracerebral hemorrhage in the past, and that was without known fall.  There is been no fever and no vomiting and no diarrhea.  Past Medical History:  Diagnosis Date  . Anxiety   . Coronary artery disease, non-occlusive    with history of MI; Cath 2008 w multivessel nonobstructive CAD  . Dementia ([place])   . Depression   . GERD (gastroesophageal reflux disease)   . Hyperlipidemia   . Hypertension   . Memory loss   . Parkinson disease ([place])   . PE (pulmonary embolism)    unprovoked PE completed 6 months of warfarin, warfarin d/ced 02/12/2010, repeat PE 07/10/2010 after a long car ride and now on lifelong coumadin.  . Pituitary microadenoma ([place])    incidental finding CT 12/2009  . Prostate cancer ([place])   . Scoliosis   . Sigmoid volvulus ([place]) 08/02/2016  . Stroke ([place]) 03/2018  . Volvulus of colon ([place]) 06/01/2017    Patient Active Problem List   Diagnosis Date Noted  . Ecchymosis 08/07/2018  . Pituitary macroadenoma ([place]) 06/06/2018  . Hemorrhagic stroke ([place])   . Prediabetes   . Dysphagia, post-stroke   . Benign essential HTN   . Anemia of chronic disease   . ICH (intracerebral hemorrhage)  ([place]) 05/05/2018  . [place] left upper eyelid 04/11/2018  . Lethargy 11/10/2017  . Fatigue 09/08/2017  . Need for immunization against influenza 09/08/2017  . Recurrent UTI 06/27/2017  . Pressure sore on buttocks 06/27/2017  . Volvulus of sigmoid colon ([place]) 05/31/2017  . Aortic atherosclerosis ([place]) 05/31/2017  . Loss of weight 09/23/2016  . Long term current use of anticoagulant   . Right bundle branch block   . Hypokalemia 08/02/2016  . Diverticulosis 07/15/2016  . Statin myopathy 03/15/2016  . Parkinsonian syndrome ([place]) 08/11/2015  . Normocytic anemia, not due to blood loss 04/16/2015  . Chronic systolic heart failure ([place]) 04/16/2015  . Falls frequently 04/16/2015  . Onychomycosis 04/15/2015  . CAD (coronary artery disease) 01/16/2015  . 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 Healthcare maintenance 10/10/2014  . Lumbosacral spondylosis without myelopathy 09/24/2013  . Bradycardia 02/20/2013  . Generalized ischemic cerebrovascular disease 02/05/2013  . Depression 01/25/2013  . Insomnia 01/20/2012  . Alzheimer's dementia ([place]) 08/17/2011  . History of pulmonary embolism 09/18/2010  . Encounter for long-term use of antiplatelets/antithrombotics 09/18/2010  . DJD (degenerative joint disease) 08/06/2010  . ADENOCARCINOMA, PROSTATE, HX OF 08/06/2010  . HLD (hyperlipidemia) 10/15/2009  . Essential hypertension, benign 10/15/2009  . GERD 10/15/2009    Past Surgical History:  Procedure Laterality Date  . CATARACT EXTRACTION    . COLONOSCOPY N/A 05/30/2017   Procedure: COLONOSCOPY;  Surgeon: Ronald Lobo, MD;  Location: [facility] ENDOSCOPY;  Service: Endoscopy;  Laterality: N/A;  . FLEXIBLE SIGMOIDOSCOPY N/A 08/02/2016   Procedure: FLEXIBLE SIGMOIDOSCOPY;  Surgeon: Ronald Lobo, MD;  Location: [facility] ENDOSCOPY;  Service: Endoscopy;  Laterality: N/A;  . FLEXIBLE SIGMOIDOSCOPY N/A 08/06/2016   Procedure: FLEXIBLE SIGMOIDOSCOPY;  Surgeon: Ronald Lobo, MD;  Location: [facility] ENDOSCOPY;   Service: Endoscopy;  Laterality: N/A;  . FLEXIBLE SIGMOIDOSCOPY Left 06/02/2017   Procedure: FLEXIBLE SIGMOIDOSCOPY;  Surgeon: Arta Silence, MD;  Location: [facility] ENDOSCOPY;  Service: Endoscopy;  Laterality: Left;  . FLEXIBLE SIGMOIDOSCOPY N/A 06/07/2017   Procedure: FLEXIBLE SIGMOIDOSCOPY for possible decompression;  Surgeon: Otis Brace, MD;  Location: [facility];  Service: Gastroenterology;  Laterality: N/A;  . LEFT HEART CATHETERIZATION WITH CORONARY ANGIOGRAM N/A 12/10/2014   Procedure: LEFT HEART CATHETERIZATION WITH CORONARY ANGIOGRAM;  Surgeon: [name], MD;  Location[facility] CATH LAB;  Service: Cardiovascular;  Laterality: N/A;  . LOOP RECORDER IMPLANT N/A 12/16/2014   Procedure: LOOP RECORDER IMPLANT;  Surgeon: [name], MD;  Location: [facility] CATH LAB;  Service: Cardiovascular;  Laterality: N/A;  . PARTIAL COLECTOMY N/A 06/08/2017   Procedure: SIGMOID  COLECTOMY;  Surgeon: Georganna Skeans, MD;  Location: [facility];  Service: General;  Laterality: N/A;  . PROSTATECTOMY          Home Medications    Prior to Admission medications   Medication Sig Start Date End Date Taking? Authorizing Provider  acetaminophen (TYLENOL) 500 MG tablet Take 1,000 mg by mouth 2 (two) times daily.     [provider]  amLODipine (NORVASC) 2.5 MG tablet TAKE 1 TABLET BY MOUTH EVERY DAY 08/29/18   [name], DO  atorvastatin (LIPITOR) 10 MG tablet Take 1 tablet (10 mg total) by mouth daily at 6 PM. 06/19/18   [name], DO  b complex vitamins tablet Take 1 tablet by mouth daily.     [provider]  Ca Phosphate-Cholecalciferol (CALCIUM/VITAMIN D3 GUMMIES) 250-350 MG-UNIT CHEW Chew 1 Dose by mouth 2 (two) times daily. Patient taking differently: Chew 1 tablet by mouth 2 (two) times daily.  07/08/16   [name], DO  carbidopa-levodopa (SINEMET IR) 25-100 MG tablet Take 1 tablet by mouth 4 (four) times daily. 05/02/18   Garvin Fila, MD  fish oil-omega-3 fatty acids  1000 MG capsule Take 2 g by mouth daily.     [provider]  loratadine (CLARITIN) 10 MG tablet Take 10 mg by mouth daily.    [provider]  memantine (NAMENDA XR) 28 MG CP24 24 hr capsule TAKE 1 CAPSULE BY MOUTH EVERY DAY IN THE MORNING 08/09/18   Garvin Fila, MD  Multiple Vitamins-Minerals (MULTIVITAMIN WITH MINERALS) tablet Take 1 tablet by mouth daily.     [provider]  polyethylene glycol (MIRALAX / GLYCOLAX) packet Take 17 g by mouth daily as needed (constipation).    [provider]  potassium chloride SA (KLOR-CON M20) 20 MEQ tablet Take 2 tablets (40 mEq total) by mouth daily. 06/27/18   [name], DO  senna (SENOKOT) 8.6 MG TABS tablet Take 1 tablet (8.6 mg total) by mouth daily as needed for mild constipation. 07/13/16   Velna Ochs, MD  warfarin (COUMADIN) 5 MG tablet Take 0.5-1 tablets (2.5-5 mg total) by mouth See admin instructions. Take one tablet (5 mg) by mouth on Monday, Wednesday, Friday at 6 pm, take 1/2 tablet (2.5 mg) on Sunday, Tuesday, Thursday, Saturday at 6pm 05/19/18   Donzetta Starch, NP  warfarin (COUMADIN) 5 MG tablet TAKE 1/2 TABLET BY MOUTH EVERY TUE, THU, SAT AND 1 TABLET BY MOUTH ALL OTHER DAYS 06/16/18   [name], DO    Family History Family History  Problem Relation Age of Onset  . Hypertension Father        Passed away from cerebral hemorrhage at age of 55.  [name] Dementia Mother   . Hypertension Child        4 adult children  . Cervical cancer Other   . Lung cancer Other   . Stroke Other   . Heart attack Neg Hx     Social History Social History   Tobacco Use  . Smoking status: Never Smoker  . Smokeless tobacco: Never Used  Substance Use Topics  . Alcohol use: No    Alcohol/week: 0.0 standard drinks  . Drug use: No     Allergies   Aricept [donepezil hcl] and Shellfish allergy   Review of Systems Review of Systems  Unable to perform ROS: Dementia  Respiratory: Positive for cough.       Physical Exam Updated Vital Signs BP 118/87 (BP Location: Right Arm)   Pulse (!) 59   Temp 98.4 F (36.9 C) (Rectal)   Resp 14   SpO2 99%   Physical Exam Vitals signs and nursing note reviewed.    84 year old male, resting comfortably and in no acute distress. Vital signs are normal. Oxygen saturation is 99%, which is normal. Head is normocephalic and atraumatic. PERRLA, EOMI. Oropharynx is clear. Neck is nontender and supple without adenopathy or JVD. Back is nontender and there is no CVA tenderness. Lungs are clear without rales, wheezes, or rhonchi. Chest is nontender. Heart has regular rate and rhythm without murmur. Abdomen is soft, flat, nontender without masses or hepatosplenomegaly and peristalsis is normoactive. Extremities have no cyanosis or edema, full range of motion is present. Skin is warm and dry without rash. Neurologic: Awake and oriented to person but not place or time, cranial nerves are intact, there are no motor or sensory deficits.  ED Treatments / Results  Labs (all labs ordered are listed, but only abnormal results are displayed) Labs Reviewed  COMPREHENSIVE METABOLIC PANEL - Abnormal; Notable for the following components:      Result Value   Calcium 8.5 (*)    Total Protein 6.3 (*)    Albumin 3.1 (*)    AST 52 (*)    All other components within normal limits  CBC WITH DIFFERENTIAL/PLATELET - Abnormal; Notable for the following components:   WBC 3.6 (*)    RBC 3.92 (*)    Hemoglobin 11.8 (*)    HCT 38.6 (*)    RDW 17.2 (*)    All other components within normal limits  URINALYSIS, ROUTINE W REFLEX MICROSCOPIC - Abnormal; Notable for the following components:   APPearance HAZY (*)    Ketones, ur 5 (*)    Leukocytes, UA TRACE (*)    All other components within normal limits  PROTIME-INR - Abnormal; Notable for the following components:   Prothrombin Time 23.0 (*)    All other components within normal limits   Radiology Ct Head Wo  Contrast  Result Date: 09/10/2018 CLINICAL DATA:  Acute onset of altered mental status. Patient unresponsive. EXAM: CT HEAD WITHOUT CONTRAST TECHNIQUE: Contiguous axial images were obtained from the base of the skull through the vertex without intravenous contrast. COMPARISON:  CT of the head and MRI of the brain performed 05/06/2018 FINDINGS:  Brain: No evidence of acute infarction, hemorrhage, hydrocephalus, extra-axial collection or mass lesion / mass effect. Prominence of the ventricles and sulci reflects moderate cortical volume loss. Cerebellar atrophy is noted. Diffuse periventricular and subcortical white matter change likely reflects small vessel ischemic microangiopathy. Chronic ischemic change is noted at the basal ganglia bilaterally. The brainstem and fourth ventricle are within normal limits. The cerebral hemispheres demonstrate grossly normal gray-white differentiation. No mass effect or midline shift is seen. Vascular: No hyperdense vessel or unexpected calcification. Skull: There is no evidence of fracture; visualized osseous structures are unremarkable in appearance. Sinuses/Orbits: The orbits are within normal limits. The paranasal sinuses and mastoid air cells are well-aerated. Other: No significant soft tissue abnormalities are seen. IMPRESSION: 1. No acute intracranial pathology seen on CT. 2. Moderate cortical volume loss and diffuse small vessel ischemic microangiopathy. 3. Chronic ischemic change at the basal ganglia bilaterally. Electronically Signed   By: Garald Balding M.D.   On: 09/10/2018 03:14   Dg Chest Port 1 View  Result Date: 09/10/2018 CLINICAL DATA:  Acute onset of cough. Concern for aspiration pneumonia. EXAM: PORTABLE CHEST 1 VIEW COMPARISON:  Chest radiograph performed 05/26/2018 FINDINGS: The lungs are hypoexpanded. Air under the hemidiaphragms is thought to reflect air within the colon. No definite focal consolidation, pleural effusion or pneumothorax is seen. The  cardiomediastinal silhouette is normal in size. No acute osseous abnormalities are identified. IMPRESSION: Lungs hypoexpanded but grossly clear. No definite evidence for aspiration pneumonia. Electronically Signed   By: Garald Balding M.D.   On: 09/10/2018 03:17    Procedures Procedures   Medications Ordered in ED Medications - No data to display   Initial Impression / Assessment and Plan / ED Course  I have reviewed the triage vital signs and the nursing notes.  Pertinent labs & imaging results that were available during my care of the patient were reviewed by me and considered in my medical decision making (see chart for details).  Altered mental status.  Lungs are clear to auscultation, but will check chest x-ray.  In light of history of prior intracerebral bleeding, will check CT head.  He does have an indwelling Foley catheter and will check urine for signs of infection.  Old records are reviewed, and he does have an admission in August of this year for hemorrhagic stroke.  Chest x-ray shows no evidence of pneumonia.  CT of the head shows no acute process.  INR is therapeutic.  Urinalysis shows no evidence of infection.  There has been a slight drop in hemoglobin compared with baseline, and this will need to be followed as an outpatient.  This is explained to family member.  He will need to follow-up with PCP, return should symptoms worsen.  No indication for acute intervention today.  Final Clinical Impressions(s) / ED Diagnoses   Final diagnoses:  Normal examination of heart and lungs  Normochromic normocytic anemia  Anticoagulated on warfarin    ED Discharge Orders    None       Delora Fuel, MD 68/12/75 1700    Delora Fuel, MD 17/49/44 [phone]

## 2018-09-11 ENCOUNTER — Telehealth: Payer: Self-pay | Admitting: *Deleted

## 2018-09-11 DIAGNOSIS — I509 Heart failure, unspecified: Secondary | ICD-10-CM | POA: Diagnosis not present

## 2018-09-11 DIAGNOSIS — R531 Weakness: Secondary | ICD-10-CM | POA: Diagnosis not present

## 2018-09-11 DIAGNOSIS — G20 Parkinson's disease: Secondary | ICD-10-CM | POA: Diagnosis not present

## 2018-09-11 DIAGNOSIS — R001 Bradycardia, unspecified: Secondary | ICD-10-CM | POA: Diagnosis not present

## 2018-09-11 DIAGNOSIS — I110 Hypertensive heart disease with heart failure: Secondary | ICD-10-CM | POA: Diagnosis not present

## 2018-09-11 DIAGNOSIS — F0280 Dementia in other diseases classified elsewhere without behavioral disturbance: Secondary | ICD-10-CM | POA: Diagnosis not present

## 2018-09-11 NOTE — Telephone Encounter (Signed)
Call from pt's daughter, [name] - stated her father is having difficulty swallowing, started over the w/e and coughing after swallowing- may need another swallowing test. Also stated he he has slight fever. No available appts today; suggested going to UC if condition worsens, stated she will. But also requesting a schedule an appt which will be Friday - appt scheulde Friday 12/27@ 1545PM.

## 2018-09-12 DIAGNOSIS — I110 Hypertensive heart disease with heart failure: Secondary | ICD-10-CM | POA: Diagnosis not present

## 2018-09-12 DIAGNOSIS — I509 Heart failure, unspecified: Secondary | ICD-10-CM | POA: Diagnosis not present

## 2018-09-12 DIAGNOSIS — F0280 Dementia in other diseases classified elsewhere without behavioral disturbance: Secondary | ICD-10-CM | POA: Diagnosis not present

## 2018-09-12 DIAGNOSIS — R001 Bradycardia, unspecified: Secondary | ICD-10-CM | POA: Diagnosis not present

## 2018-09-12 DIAGNOSIS — G20 Parkinson's disease: Secondary | ICD-10-CM | POA: Diagnosis not present

## 2018-09-12 DIAGNOSIS — R531 Weakness: Secondary | ICD-10-CM | POA: Diagnosis not present

## 2018-09-12 NOTE — Telephone Encounter (Signed)
Agree, that sounds appropriate.

## 2018-09-13 DIAGNOSIS — R531 Weakness: Secondary | ICD-10-CM | POA: Diagnosis not present

## 2018-09-13 DIAGNOSIS — G20 Parkinson's disease: Secondary | ICD-10-CM | POA: Diagnosis not present

## 2018-09-13 DIAGNOSIS — R001 Bradycardia, unspecified: Secondary | ICD-10-CM | POA: Diagnosis not present

## 2018-09-13 DIAGNOSIS — I110 Hypertensive heart disease with heart failure: Secondary | ICD-10-CM | POA: Diagnosis not present

## 2018-09-13 DIAGNOSIS — I509 Heart failure, unspecified: Secondary | ICD-10-CM | POA: Diagnosis not present

## 2018-09-13 DIAGNOSIS — F0280 Dementia in other diseases classified elsewhere without behavioral disturbance: Secondary | ICD-10-CM | POA: Diagnosis not present

## 2018-09-14 DIAGNOSIS — G20 Parkinson's disease: Secondary | ICD-10-CM | POA: Diagnosis not present

## 2018-09-14 DIAGNOSIS — F0280 Dementia in other diseases classified elsewhere without behavioral disturbance: Secondary | ICD-10-CM | POA: Diagnosis not present

## 2018-09-14 DIAGNOSIS — I509 Heart failure, unspecified: Secondary | ICD-10-CM | POA: Diagnosis not present

## 2018-09-14 DIAGNOSIS — R531 Weakness: Secondary | ICD-10-CM | POA: Diagnosis not present

## 2018-09-14 DIAGNOSIS — I110 Hypertensive heart disease with heart failure: Secondary | ICD-10-CM | POA: Diagnosis not present

## 2018-09-14 DIAGNOSIS — R001 Bradycardia, unspecified: Secondary | ICD-10-CM | POA: Diagnosis not present

## 2018-09-15 ENCOUNTER — Other Ambulatory Visit: Payer: Self-pay

## 2018-09-15 ENCOUNTER — Ambulatory Visit (INDEPENDENT_AMBULATORY_CARE_PROVIDER_SITE_OTHER): Payer: Medicare Other | Admitting: Internal Medicine

## 2018-09-15 VITALS — BP 146/96 | HR 70 | Temp 98.0°F

## 2018-09-15 DIAGNOSIS — R001 Bradycardia, unspecified: Secondary | ICD-10-CM | POA: Diagnosis not present

## 2018-09-15 DIAGNOSIS — I69391 Dysphagia following cerebral infarction: Secondary | ICD-10-CM | POA: Diagnosis not present

## 2018-09-15 DIAGNOSIS — F0390 Unspecified dementia without behavioral disturbance: Secondary | ICD-10-CM

## 2018-09-15 DIAGNOSIS — R1312 Dysphagia, oropharyngeal phase: Secondary | ICD-10-CM

## 2018-09-15 DIAGNOSIS — I509 Heart failure, unspecified: Secondary | ICD-10-CM | POA: Diagnosis not present

## 2018-09-15 DIAGNOSIS — G20 Parkinson's disease: Secondary | ICD-10-CM | POA: Diagnosis not present

## 2018-09-15 DIAGNOSIS — R531 Weakness: Secondary | ICD-10-CM | POA: Diagnosis not present

## 2018-09-15 DIAGNOSIS — F0280 Dementia in other diseases classified elsewhere without behavioral disturbance: Secondary | ICD-10-CM | POA: Diagnosis not present

## 2018-09-15 DIAGNOSIS — I110 Hypertensive heart disease with heart failure: Secondary | ICD-10-CM | POA: Diagnosis not present

## 2018-09-15 MED ORDER — STARCH-MALTODEXTRIN PO POWD: 1.0000 | Freq: Every day | ORAL | 3 refills | Status: DC | PRN

## 2018-09-15 NOTE — Progress Notes (Signed)
   CC: difficulty swallowing   HPI:  [name] [name] is a 84 y.o. with PMH as listed below who presents for evaluation of difficulty swallowing. Please see the assessment and plans for the status of the patient chronic medical problems.   Past Medical History:  Diagnosis Date  . Anxiety   . Coronary artery disease, non-occlusive    with history of MI; Cath 2008 w multivessel nonobstructive CAD  . Dementia ([place])   . Depression   . GERD (gastroesophageal reflux disease)   . Hyperlipidemia   . Hypertension   . Memory loss   . Parkinson disease ([place])   . PE (pulmonary embolism)    unprovoked PE completed 6 months of warfarin, warfarin d/ced 02/12/2010, repeat PE 07/10/2010 after a long car ride and now on lifelong coumadin.  . Pituitary microadenoma ([place])    incidental finding CT 12/2009  . Prostate cancer ([place])   . Scoliosis   . Sigmoid volvulus ([place]) 08/02/2016  . Stroke ([place]) 03/2018  . Volvulus of colon ([place]) 06/01/2017   Review of Systems:  Refer to history of present illness and assessment and plans for pertinent review of systems, all others reviewed and negative  Physical Exam:  Vitals:   09/15/18 1459  BP: (!) 146/96  Pulse: 70  Temp: 98 F (36.7 C)  TempSrc: Oral  SpO2: 97%   General: well appearing, no acute distress  HEENT: no posterior oropharynx erythema, no tonsilar exudate, no cervical adenopathy or thyromegaly   Cardiac: regular rate and rhythm, no murmur  Pulm: lungs clear to auscultation  GI: abdomen is soft, non tender, non distended  Assessment & Plan:   Difficulty swallowing  Difficulty swallowing liquids. Foods are purred and he has had no trouble with this. Associated with cough. Was evaluated in the ED December 22. Labs showed an elevated AST, WBC 3.6, Hgb 11.8, INR 2. Chest xray was notable for air under bl diaphragms which can be seen on prior exam from September, no acute pulmonary process. CT head showed no acute abnormalities. Patients  daughter began adding apple sauce to his liquids and believes this has helped. He was having a whistle/wheezing sound after drinking but this has relieved since she started thickening his liquids. She also notes that he has not been swallowing his saliva and she has needed to help him by using oral suctioning. She is concerned that he is not clearing his secretions at night because she can hear him wake up and cough at night. Patients daughter is present is requesting a swallow study. He does not have regurgitation or vomiting of food. He has not complained of abdominal pain or had noticed melena.  Oropharyngeal dysphagia is likely mechanical and related to progression of dementia and prior strokes.  - referral for swallow eval    - prescription placed for thicken up   See Encounters Tab for problem based charting.  Patient seen with Dr. Angelia Mould

## 2018-09-15 NOTE — Progress Notes (Signed)
Internal Medicine Clinic Attending  I saw and evaluated the patient.  I personally confirmed the key portions of the history and exam documented by Dr. Blum and I reviewed pertinent patient test results.  The assessment, diagnosis, and plan were formulated together and I agree with the documentation in the resident's note. 

## 2018-09-15 NOTE — Assessment & Plan Note (Signed)
Difficulty swallowing liquids. Foods are purred and he has had no trouble with this. Associated with cough. Was evaluated in the ED December 22. Labs showed an elevated AST, WBC 3.6, Hgb 11.8, INR 2. Chest xray was notable for air under bl diaphragms which can be seen on prior exam from September, no acute pulmonary process. CT head showed no acute abnormalities. Patients daughter began adding apple sauce to his liquids and believes this has helped. He was having a whistle/wheezing sound after drinking but this has relieved since she started thickening his liquids. She also notes that he has not been swallowing his saliva and she has needed to help him by using oral suctioning. She is concerned that he is not clearing his secretions at night because she can hear him wake up and cough at night. Patients daughter is present is requesting a swallow study. He does not have regurgitation or vomiting of food. He has not complained of abdominal pain or had noticed melena.  Oropharyngeal dysphagia is likely mechanical and related to progression of dementia and prior strokes.  - referral for swallow eval    - prescription placed for thicken up

## 2018-09-15 NOTE — Patient Instructions (Signed)
Thank you for coming to the clinic today. It was a pleasure to see you.   For your difficulty swallowing - try using this liquid thickening powder until you have the chance to meet with the speech pathologist    Please call us if you need anything   Please call the internal medicine center clinic if you have any questions or concerns, we may be able to help and keep you from a long and expensive emergency room wait. Our clinic and after hours phone number is [phone], the best time to call is Monday through Friday 9 am to 4 pm but there is always someone available 24/7 if you have an emergency. If you need medication refills please notify your pharmacy one week in advance and they will send [name] a request.

## 2018-09-18 DIAGNOSIS — R531 Weakness: Secondary | ICD-10-CM | POA: Diagnosis not present

## 2018-09-18 DIAGNOSIS — F0280 Dementia in other diseases classified elsewhere without behavioral disturbance: Secondary | ICD-10-CM | POA: Diagnosis not present

## 2018-09-18 DIAGNOSIS — I110 Hypertensive heart disease with heart failure: Secondary | ICD-10-CM | POA: Diagnosis not present

## 2018-09-18 DIAGNOSIS — I509 Heart failure, unspecified: Secondary | ICD-10-CM | POA: Diagnosis not present

## 2018-09-18 DIAGNOSIS — G20 Parkinson's disease: Secondary | ICD-10-CM | POA: Diagnosis not present

## 2018-09-18 DIAGNOSIS — R001 Bradycardia, unspecified: Secondary | ICD-10-CM | POA: Diagnosis not present

## 2018-09-19 DIAGNOSIS — G20 Parkinson's disease: Secondary | ICD-10-CM | POA: Diagnosis not present

## 2018-09-19 DIAGNOSIS — I509 Heart failure, unspecified: Secondary | ICD-10-CM | POA: Diagnosis not present

## 2018-09-19 DIAGNOSIS — F0280 Dementia in other diseases classified elsewhere without behavioral disturbance: Secondary | ICD-10-CM | POA: Diagnosis not present

## 2018-09-19 DIAGNOSIS — R531 Weakness: Secondary | ICD-10-CM | POA: Diagnosis not present

## 2018-09-19 DIAGNOSIS — R001 Bradycardia, unspecified: Secondary | ICD-10-CM | POA: Diagnosis not present

## 2018-09-19 DIAGNOSIS — I110 Hypertensive heart disease with heart failure: Secondary | ICD-10-CM | POA: Diagnosis not present

## 2018-09-20 DIAGNOSIS — F0280 Dementia in other diseases classified elsewhere without behavioral disturbance: Secondary | ICD-10-CM | POA: Diagnosis not present

## 2018-09-20 DIAGNOSIS — I509 Heart failure, unspecified: Secondary | ICD-10-CM | POA: Diagnosis not present

## 2018-09-20 DIAGNOSIS — Z515 Encounter for palliative care: Secondary | ICD-10-CM | POA: Diagnosis not present

## 2018-09-20 DIAGNOSIS — R159 Full incontinence of feces: Secondary | ICD-10-CM | POA: Diagnosis not present

## 2018-09-20 DIAGNOSIS — R001 Bradycardia, unspecified: Secondary | ICD-10-CM | POA: Diagnosis not present

## 2018-09-20 DIAGNOSIS — R269 Unspecified abnormalities of gait and mobility: Secondary | ICD-10-CM | POA: Diagnosis not present

## 2018-09-20 DIAGNOSIS — I110 Hypertensive heart disease with heart failure: Secondary | ICD-10-CM | POA: Diagnosis not present

## 2018-09-20 DIAGNOSIS — G20 Parkinson's disease: Secondary | ICD-10-CM | POA: Diagnosis not present

## 2018-09-20 DIAGNOSIS — R32 Unspecified urinary incontinence: Secondary | ICD-10-CM | POA: Diagnosis not present

## 2018-09-20 DIAGNOSIS — M1A00X0 Idiopathic chronic gout, unspecified site, without tophus (tophi): Secondary | ICD-10-CM | POA: Diagnosis not present

## 2018-09-20 DIAGNOSIS — R531 Weakness: Secondary | ICD-10-CM | POA: Diagnosis not present

## 2018-09-21 DIAGNOSIS — R001 Bradycardia, unspecified: Secondary | ICD-10-CM | POA: Diagnosis not present

## 2018-09-21 DIAGNOSIS — I509 Heart failure, unspecified: Secondary | ICD-10-CM | POA: Diagnosis not present

## 2018-09-21 DIAGNOSIS — R531 Weakness: Secondary | ICD-10-CM | POA: Diagnosis not present

## 2018-09-21 DIAGNOSIS — I110 Hypertensive heart disease with heart failure: Secondary | ICD-10-CM | POA: Diagnosis not present

## 2018-09-21 DIAGNOSIS — F0280 Dementia in other diseases classified elsewhere without behavioral disturbance: Secondary | ICD-10-CM | POA: Diagnosis not present

## 2018-09-21 DIAGNOSIS — G20 Parkinson's disease: Secondary | ICD-10-CM | POA: Diagnosis not present

## 2018-09-22 DIAGNOSIS — G20 Parkinson's disease: Secondary | ICD-10-CM | POA: Diagnosis not present

## 2018-09-22 DIAGNOSIS — R001 Bradycardia, unspecified: Secondary | ICD-10-CM | POA: Diagnosis not present

## 2018-09-22 DIAGNOSIS — F0280 Dementia in other diseases classified elsewhere without behavioral disturbance: Secondary | ICD-10-CM | POA: Diagnosis not present

## 2018-09-22 DIAGNOSIS — R531 Weakness: Secondary | ICD-10-CM | POA: Diagnosis not present

## 2018-09-22 DIAGNOSIS — I509 Heart failure, unspecified: Secondary | ICD-10-CM | POA: Diagnosis not present

## 2018-09-22 DIAGNOSIS — I110 Hypertensive heart disease with heart failure: Secondary | ICD-10-CM | POA: Diagnosis not present

## 2018-09-23 ENCOUNTER — Emergency Department (HOSPITAL_COMMUNITY): Payer: Medicare Other

## 2018-09-23 ENCOUNTER — Emergency Department (HOSPITAL_COMMUNITY)
Admission: EM | Admit: 2018-09-23 | Discharge: 2018-09-23 | Disposition: A | Discharge status: Home / Self Care | Payer: Medicare Other | Attending: Emergency Medicine | Admitting: Emergency Medicine

## 2018-09-23 ENCOUNTER — Other Ambulatory Visit: Payer: Self-pay

## 2018-09-23 ENCOUNTER — Encounter (HOSPITAL_COMMUNITY): Payer: Self-pay | Admitting: Emergency Medicine

## 2018-09-23 DIAGNOSIS — G20 Parkinson's disease: Secondary | ICD-10-CM | POA: Diagnosis not present

## 2018-09-23 DIAGNOSIS — R1011 Right upper quadrant pain: Secondary | ICD-10-CM | POA: Insufficient documentation

## 2018-09-23 DIAGNOSIS — F0390 Unspecified dementia without behavioral disturbance: Secondary | ICD-10-CM | POA: Diagnosis not present

## 2018-09-23 DIAGNOSIS — R7303 Prediabetes: Secondary | ICD-10-CM | POA: Diagnosis not present

## 2018-09-23 DIAGNOSIS — Z8546 Personal history of malignant neoplasm of prostate: Secondary | ICD-10-CM | POA: Insufficient documentation

## 2018-09-23 DIAGNOSIS — Z8673 Personal history of transient ischemic attack (TIA), and cerebral infarction without residual deficits: Secondary | ICD-10-CM | POA: Diagnosis not present

## 2018-09-23 DIAGNOSIS — N3000 Acute cystitis without hematuria: Secondary | ICD-10-CM | POA: Insufficient documentation

## 2018-09-23 DIAGNOSIS — Z7901 Long term (current) use of anticoagulants: Secondary | ICD-10-CM | POA: Insufficient documentation

## 2018-09-23 DIAGNOSIS — I2510 Atherosclerotic heart disease of native coronary artery without angina pectoris: Secondary | ICD-10-CM | POA: Diagnosis not present

## 2018-09-23 DIAGNOSIS — I10 Essential (primary) hypertension: Secondary | ICD-10-CM | POA: Insufficient documentation

## 2018-09-23 DIAGNOSIS — R55 Syncope and collapse: Secondary | ICD-10-CM | POA: Diagnosis not present

## 2018-09-23 DIAGNOSIS — I4510 Unspecified right bundle-branch block: Secondary | ICD-10-CM | POA: Diagnosis not present

## 2018-09-23 DIAGNOSIS — G309 Alzheimer's disease, unspecified: Secondary | ICD-10-CM | POA: Diagnosis not present

## 2018-09-23 DIAGNOSIS — I6782 Cerebral ischemia: Secondary | ICD-10-CM | POA: Diagnosis not present

## 2018-09-23 DIAGNOSIS — R918 Other nonspecific abnormal finding of lung field: Secondary | ICD-10-CM | POA: Diagnosis not present

## 2018-09-23 DIAGNOSIS — N2889 Other specified disorders of kidney and ureter: Secondary | ICD-10-CM | POA: Diagnosis not present

## 2018-09-23 DIAGNOSIS — Z79899 Other long term (current) drug therapy: Secondary | ICD-10-CM | POA: Diagnosis not present

## 2018-09-23 DIAGNOSIS — R404 Transient alteration of awareness: Secondary | ICD-10-CM | POA: Diagnosis not present

## 2018-09-23 DIAGNOSIS — J9811 Atelectasis: Secondary | ICD-10-CM | POA: Diagnosis not present

## 2018-09-23 LAB — URINALYSIS, ROUTINE W REFLEX MICROSCOPIC
Bilirubin Urine: NEGATIVE
Glucose, UA: NEGATIVE mg/dL
Hgb urine dipstick: NEGATIVE
Ketones, ur: 5 mg/dL — AB
Nitrite: NEGATIVE
PH: 5 (ref 5.0–8.0)
Protein, ur: NEGATIVE mg/dL
Specific Gravity, Urine: 1.021 (ref 1.005–1.030)
WBC, UA: >50 WBC/hpf — ABNORMAL HIGH (ref 0–5)

## 2018-09-23 LAB — CBC WITH DIFFERENTIAL/PLATELET
Abs Immature Granulocytes: 0 10*3/uL (ref 0.00–0.07)
Basophils Absolute: 0.1 10*3/uL (ref 0.0–0.1)
Basophils Relative: 1 %
Eosinophils Absolute: 0.1 10*3/uL (ref 0.0–0.5)
Eosinophils Relative: 3 %
HEMATOCRIT: 44 % (ref 39.0–52.0)
Hemoglobin: 13.4 g/dL (ref 13.0–17.0)
Immature Granulocytes: 0 %
LYMPHS PCT: 44 %
Lymphs Abs: 2 10*3/uL (ref 0.7–4.0)
MCH: 29.8 pg (ref 26.0–34.0)
MCHC: 30.5 g/dL (ref 30.0–36.0)
MCV: 98 fL (ref 80.0–100.0)
Monocytes Absolute: 0.3 10*3/uL (ref 0.1–1.0)
Monocytes Relative: 6 %
Neutro Abs: 2.1 10*3/uL (ref 1.7–7.7)
Neutrophils Relative %: 46 %
PLATELETS: 187 10*3/uL (ref 150–400)
RBC: 4.49 MIL/uL (ref 4.22–5.81)
RDW: 16.9 % — ABNORMAL HIGH (ref 11.5–15.5)
WBC: 4.5 10*3/uL (ref 4.0–10.5)
nRBC: 0 % (ref 0.0–0.2)

## 2018-09-23 LAB — COMPREHENSIVE METABOLIC PANEL
ALT: 10 U/L (ref 0–44)
AST: 53 U/L — ABNORMAL HIGH (ref 15–41)
Albumin: 3.4 g/dL — ABNORMAL LOW (ref 3.5–5.0)
Alkaline Phosphatase: 72 U/L (ref 38–126)
Anion gap: 9 (ref 5–15)
BUN: 9 mg/dL (ref 8–23)
CO2: 23 mmol/L (ref 22–32)
Calcium: 9.1 mg/dL (ref 8.9–10.3)
Chloride: 107 mmol/L (ref 98–111)
Creatinine, Ser: 0.96 mg/dL (ref 0.61–1.24)
GFR calc Af Amer: >60 mL/min (ref >60)
GFR calc non Af Amer: >60 mL/min (ref >60)
Glucose, Bld: 122 mg/dL — ABNORMAL HIGH (ref 70–99)
Potassium: 4.1 mmol/L (ref 3.5–5.1)
Sodium: 139 mmol/L (ref 135–145)
Total Bilirubin: 0.7 mg/dL (ref 0.3–1.2)
Total Protein: 7.7 g/dL (ref 6.5–8.1)

## 2018-09-23 LAB — TROPONIN I: Troponin I: <0.03 ng/mL (ref <0.03)

## 2018-09-23 LAB — PROTIME-INR
INR: 2.22
Prothrombin Time: 24.3 seconds — ABNORMAL HIGH (ref 11.4–15.2)

## 2018-09-23 LAB — I-STAT TROPONIN, ED: Troponin i, poc: 0.02 ng/mL (ref 0.00–0.08)

## 2018-09-23 LAB — TSH: TSH: 2.752 u[IU]/mL (ref 0.350–4.500)

## 2018-09-23 LAB — AMMONIA: Ammonia: 14 umol/L (ref 9–35)

## 2018-09-23 MED ORDER — CEPHALEXIN 500 MG PO CAPS: 500.0000 mg | ORAL_CAPSULE | Freq: Four times a day (QID) | ORAL | 0 refills | Status: AC

## 2018-09-23 MED ORDER — IOPAMIDOL (ISOVUE-370) INJECTION 76%
100.0000 mL | Freq: Once | INTRAVENOUS | Status: AC | PRN
  Administered 2018-09-23: 100 mL via INTRAVENOUS

## 2018-09-23 MED ORDER — IOPAMIDOL (ISOVUE-370) INJECTION 76%
INTRAVENOUS | Status: AC
  Filled 2018-09-23: qty 100

## 2018-09-23 NOTE — ED Provider Notes (Signed)
[facility] EMERGENCY DEPARTMENT Provider Note   CSN: [id number] Arrival date & time: 09/23/18  1831     History   Chief Complaint Chief Complaint  Patient presents with  . Loss of Consciousness    HPI [name] [name] is a 85 y.o. male.  HPI   Patient is an 85 year old male with a past medical history of CAD, dementia, HTN, HDL, Parkinson's disease, PE, pituitary microadenoma, prostate cancer, CVA, and depression who presents via EMS from home for evaluation of an episode that occurred with the patient was reportedly sitting on the toilet.  Per EMS family members witnessed the patient state " I need to go to the hospital".  The patient's eyes then rolled the back of his head and him they reportedly was not interactive with them for several seconds.  It is unclear if he had a syncopal episode.  There were no report of an actual fall or traumatic injuries.  Per sons were at bedside during initial presentation but who do not live with the patient he is not normally oriented and does not normally follow commands.  Patient is unable to provide any history secondary to his advanced dementia.  Past Medical History:  Diagnosis Date  . Anxiety   . Coronary artery disease, non-occlusive    with history of MI; Cath 2008 w multivessel nonobstructive CAD  . Dementia ([place])   . Depression   . GERD (gastroesophageal reflux disease)   . Hyperlipidemia   . Hypertension   . Memory loss   . Parkinson disease ([place])   . PE (pulmonary embolism)    unprovoked PE completed 6 months of warfarin, warfarin d/ced 02/12/2010, repeat PE 07/10/2010 after a long car ride and now on lifelong coumadin.  . Pituitary microadenoma ([place])    incidental finding CT 12/2009  . Prostate cancer ([place])   . Scoliosis   . Sigmoid volvulus ([place]) 08/02/2016  . Stroke ([place]) 03/2018  . Volvulus of colon ([place]) 06/01/2017    Patient Active Problem List   Diagnosis Date Noted  . Ecchymosis 08/07/2018  .  Pituitary macroadenoma ([place]) 06/06/2018  . Hemorrhagic stroke ([place])   . Prediabetes   . Dysphagia, post-stroke   . Benign essential HTN   . Anemia of chronic disease   . ICH (intracerebral hemorrhage) ([place]) 05/05/2018  . Chalazion left upper eyelid 04/11/2018  . Lethargy 11/10/2017  . Fatigue 09/08/2017  . Need for immunization against influenza 09/08/2017  . Recurrent UTI 06/27/2017  . Pressure sore on buttocks 06/27/2017  . Volvulus of sigmoid colon ([place]) 05/31/2017  . Aortic atherosclerosis ([place]) 05/31/2017  . Loss of weight 09/23/2016  . Long term current use of anticoagulant   . Right bundle branch block   . Hypokalemia 08/02/2016  . Diverticulosis 07/15/2016  . Statin myopathy 03/15/2016  . Parkinsonian syndrome ([place]) 08/11/2015  . Normocytic anemia, not due to blood loss 04/16/2015  . Chronic systolic heart failure ([place]) 04/16/2015  . Falls frequently 04/16/2015  . Onychomycosis 04/15/2015  . CAD (coronary artery disease) 01/16/2015  . 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 Healthcare maintenance 10/10/2014  . Lumbosacral spondylosis without myelopathy 09/24/2013  . Bradycardia 02/20/2013  . Generalized ischemic cerebrovascular disease 02/05/2013  . Depression 01/25/2013  . Insomnia 01/20/2012  . Alzheimer's dementia ([place]) 08/17/2011  . History of pulmonary embolism 09/18/2010  . Encounter for long-term use of antiplatelets/antithrombotics 09/18/2010  . DJD (degenerative joint disease) 08/06/2010  . ADENOCARCINOMA, PROSTATE, HX OF 08/06/2010  . HLD (hyperlipidemia) 10/15/2009  .  Essential hypertension, benign 10/15/2009  . GERD 10/15/2009    Past Surgical History:  Procedure Laterality Date  . CATARACT EXTRACTION    . COLONOSCOPY N/A 05/30/2017   Procedure: COLONOSCOPY;  Surgeon: Ronald Lobo, MD;  Location: [facility] ENDOSCOPY;  Service: Endoscopy;  Laterality: N/A;  . FLEXIBLE SIGMOIDOSCOPY N/A 08/02/2016   Procedure: FLEXIBLE SIGMOIDOSCOPY;  Surgeon: Ronald Lobo,  MD;  Location: [facility] ENDOSCOPY;  Service: Endoscopy;  Laterality: N/A;  . FLEXIBLE SIGMOIDOSCOPY N/A 08/06/2016   Procedure: FLEXIBLE SIGMOIDOSCOPY;  Surgeon: Ronald Lobo, MD;  Location: [facility] ENDOSCOPY;  Service: Endoscopy;  Laterality: N/A;  . FLEXIBLE SIGMOIDOSCOPY Left 06/02/2017   Procedure: FLEXIBLE SIGMOIDOSCOPY;  Surgeon: Arta Silence, MD;  Location: [facility] ENDOSCOPY;  Service: Endoscopy;  Laterality: Left;  . FLEXIBLE SIGMOIDOSCOPY N/A 06/07/2017   Procedure: FLEXIBLE SIGMOIDOSCOPY for possible decompression;  Surgeon: Otis Brace, MD;  Location: [place];  Service: Gastroenterology;  Laterality: N/A;  . LEFT HEART CATHETERIZATION WITH CORONARY ANGIOGRAM N/A 12/10/2014   Procedure: LEFT HEART CATHETERIZATION WITH CORONARY ANGIOGRAM;  Surgeon: [name], MD;  Location[facility] CATH LAB;  Service: Cardiovascular;  Laterality: N/A;  . LOOP RECORDER IMPLANT N/A 12/16/2014   Procedure: LOOP RECORDER IMPLANT;  Surgeon: [name], MD;  Location: [facility] CATH LAB;  Service: Cardiovascular;  Laterality: N/A;  . PARTIAL COLECTOMY N/A 06/08/2017   Procedure: SIGMOID  COLECTOMY;  Surgeon: Georganna Skeans, MD;  Location: [place];  Service: General;  Laterality: N/A;  . PROSTATECTOMY          Home Medications    Prior to Admission medications   Medication Sig Start Date End Date Taking? Authorizing Provider  acetaminophen (TYLENOL) 500 MG tablet Take 1,000 mg by mouth 2 (two) times daily.    Yes [provider]  amLODipine (NORVASC) 2.5 MG tablet TAKE 1 TABLET BY MOUTH EVERY DAY Patient taking differently: Take 2.5 mg by mouth daily.  08/29/18  Yes [name], DO  atorvastatin (LIPITOR) 10 MG tablet Take 1 tablet (10 mg total) by mouth daily at 6 PM. 06/19/18  Yes Hoffman, Rachel Moulds, DO  Ca Phosphate-Cholecalciferol (CALCIUM/VITAMIN D3 GUMMIES) 250-350 MG-UNIT CHEW Chew 1 Dose by mouth 2 (two) times daily. 07/08/16  Yes [name], DO  carbidopa-levodopa (SINEMET IR) 25-100 MG  tablet Take 1 tablet by mouth 4 (four) times daily. 05/02/18  Yes Garvin Fila, MD  loratadine (CLARITIN) 10 MG tablet Take 10 mg by mouth daily.   Yes [provider]  memantine (NAMENDA XR) 28 MG CP24 24 hr capsule TAKE 1 CAPSULE BY MOUTH EVERY DAY IN THE MORNING Patient taking differently: Take 28 mg by mouth daily.  08/09/18  Yes Garvin Fila, MD  Multiple Vitamins-Minerals (MULTIVITAMIN WITH MINERALS) tablet Take 1 tablet by mouth daily.    Yes [provider]  polyethylene glycol (MIRALAX / GLYCOLAX) packet Take 17 g by mouth daily as needed (constipation).   Yes [provider]  potassium chloride SA (KLOR-CON M20) 20 MEQ tablet Take 2 tablets (40 mEq total) by mouth daily. 06/27/18  Yes [name], DO  senna (SENOKOT) 8.6 MG TABS tablet Take 1 tablet (8.6 mg total) by mouth daily as needed for mild constipation. 07/13/16  Yes Velna Ochs, MD  warfarin (COUMADIN) 5 MG tablet TAKE 1/2 TABLET BY MOUTH EVERY TUE, THU, SAT AND 1 TABLET BY MOUTH ALL OTHER DAYS Patient taking differently: Take 2.5-5 mg by mouth See admin instructions. TAKE 1/2 TABLET BY MOUTH EVERY TUE, THU, SAT AND  1 TABLET BY MOUTH ALL OTHER DAYS 06/16/18  Yes [name], DO  cephALEXin (KEFLEX) 500 MG capsule Take 1 capsule (500 mg total) by mouth 4 (four) times daily for 7 days. 09/23/18 09/30/18  Hulan Saas, MD  STARCH-MALTO DEXTRIN ([employer] INSTANT FOOD THICKENER) POWD Take 1 Scoop by mouth daily as needed. 09/15/18   Ledell Noss, MD    Family History Family History  Problem Relation Age of Onset  . Hypertension Father        Passed away from cerebral hemorrhage at age of 46.  [name] Dementia Mother   . Hypertension Child        4 adult children  . Cervical cancer Other   . Lung cancer Other   . Stroke Other   . Heart attack Neg Hx     Social History Social History   Tobacco Use  . Smoking status: Never Smoker  . Smokeless tobacco: Never Used  Substance Use Topics  .  Alcohol use: No    Alcohol/week: 0.0 standard drinks  . Drug use: No     Allergies   Aricept [donepezil hcl] and Shellfish allergy   Review of Systems Review of Systems  Unable to perform ROS: Dementia     Physical Exam Updated Vital Signs BP (!) 141/98 (BP Location: Right Arm)   Pulse 63   Temp (!) 97.3 F (36.3 C) (Rectal)   Resp 16   Ht 5\' 8"  (1.727 m)   Wt 61.6 kg   SpO2 99%   BMI 20.66 kg/m   Physical Exam Vitals signs and nursing note reviewed.  Constitutional:      Appearance: Normal appearance. He is normal weight.  HENT:     Head: Normocephalic and atraumatic.     Right Ear: External ear normal.     Left Ear: External ear normal.     Nose: Nose normal.     Mouth/Throat:     Mouth: Mucous membranes are moist.  Eyes:     Conjunctiva/sclera: Conjunctivae normal.  Cardiovascular:     Rate and Rhythm: Normal rate.     Pulses: Normal pulses.  Pulmonary:     Effort: Pulmonary effort is normal.  Abdominal:     Tenderness: There is abdominal tenderness in the right upper quadrant.  Skin:    Capillary Refill: Capillary refill takes less than 2 seconds.  Neurological:     General: No focal deficit present.     Mental Status: He is alert. He is disoriented and confused.     GCS: GCS eye subscore is 4. GCS verbal subscore is 2. GCS motor subscore is 4.    Patient does not follow any commands but does withdraw all extremities to painful stimuli.  Pupils are 2 mm and reactive to light bilaterally.  ED Treatments / Results  Labs (all labs ordered are listed, but only abnormal results are displayed) Labs Reviewed  CBC WITH DIFFERENTIAL/PLATELET - Abnormal; Notable for the following components:      Result Value   RDW 16.9 (*)    All other components within normal limits  COMPREHENSIVE METABOLIC PANEL - Abnormal; Notable for the following components:   Glucose, Bld 122 (*)    Albumin 3.4 (*)    AST 53 (*)    All other components within normal limits    URINALYSIS, ROUTINE W REFLEX MICROSCOPIC - Abnormal; Notable for the following components:   Color, Urine AMBER (*)    APPearance HAZY (*)    Ketones, ur  5 (*)    Leukocytes, UA MODERATE (*)    WBC, UA >50 (*)    Bacteria, UA RARE (*)    All other components within normal limits  PROTIME-INR - Abnormal; Notable for the following components:   Prothrombin Time 24.3 (*)    All other components within normal limits  TROPONIN I  TSH  AMMONIA  I-STAT TROPONIN, ED    EKG None  Radiology Dg Chest 1 View  Result Date: 09/23/2018 CLINICAL DATA:  Altered mental status, history dementia, coronary artery disease, hypertension, pulmonary embolism, prostate cancer EXAM: CHEST  1 VIEW COMPARISON:  Portable exam 1855 hours compared to 09/10/2018 FINDINGS: Loop recorder projects over lower LEFT chest. Elevation of the hemidiaphragms bilaterally with multiple bowel loops under both diaphragms. Normal heart size, mediastinal contours, and pulmonary vascularity. Atherosclerotic calcification aorta. Bronchitic changes with bibasilar atelectasis. No infiltrate, pleural effusion, or pneumothorax. Bones demineralized. IMPRESSION: Bronchitic changes with bibasilar atelectasis. Chronic elevation of the diaphragms and low lung volumes. Electronically Signed   By: Lavonia Dana M.D.   On: 09/23/2018 19:14   Ct Head Wo Contrast  Result Date: 09/23/2018 CLINICAL DATA:  Pt arrives via EMS with reports of pt sitting on toilet and told family that he needed to go to the hospital, then his eyes rolled back but never fell or passed out. Family reports he was normal immediately after EXAM: CT HEAD WITHOUT CONTRAST TECHNIQUE: Contiguous axial images were obtained from the base of the skull through the vertex without intravenous contrast. COMPARISON:  09/10/2018 FINDINGS: Brain: There is ventricular sulcal enlargement reflecting mild diffuse atrophy, stable. No hydrocephalus. There are no parenchymal masses or mass effect.  Patchy white matter hypoattenuation is seen bilaterally consistent with advanced chronic microvascular ischemic change. Probable small old lacune infarct in the right thalamus. Pituitary gland is enlarged, but unchanged from the prior exam. No extra-axial masses or abnormal fluid collections. No intracranial hemorrhage. Vascular: No hyperdense vessel or unexpected calcification. Skull: Normal. Negative for fracture or focal lesion. Sinuses/Orbits: Globes and orbits are unremarkable. The visualized sinuses and mastoid air cells are clear. Other: None. IMPRESSION: 1. No acute intracranial abnormalities. 2. Mild atrophy and advanced chronic microvascular ischemic change. 3. Enlarged pituitary gland stable from prior studies. Electronically Signed   By: Lajean Manes M.D.   On: 09/23/2018 21:07   Ct Angio Chest/abd/pel For Dissection W And/or Wo Contrast  Result Date: 09/23/2018 CLINICAL DATA:  Pt arrives via EMS with reports of pt sitting on toilet and told family that he needed to go to the hospital, then his eyes rolled back but never fell or passed out. Family reports he was normal immediately after EXAM: CT ANGIOGRAPHY CHEST, ABDOMEN AND PELVIS TECHNIQUE: Multidetector CT imaging through the chest, abdomen and pelvis was performed using the standard protocol during bolus administration of intravenous contrast. Multiplanar reconstructed images and MIPs were obtained and reviewed to evaluate the vascular anatomy. CONTRAST:  128mL ISOVUE-370 IOPAMIDOL (ISOVUE-370) INJECTION 76% COMPARISON:  None. FINDINGS: CTA CHEST FINDINGS Cardiovascular: No aortic dissection. Thoracic aorta is prominent, largest the ascending portion measuring 3.6 cm in diameter. Minor aortic atherosclerosis. Aortic arch branch vessels are widely patent. Common carotid arteries and vertebral arteries are widely patent. Heart is normal in size. Left coronary artery calcifications. No pericardial effusion. Prominent pulmonary arteries right  measuring 2.8 cm, left 2.7 cm. Pulmonary arteries well opacified. No evidence of a pulmonary embolism. Mediastinum/Nodes: No neck base, axillary, mediastinal or hilar masses or enlarged lymph nodes. Trachea is widely  patent. Esophagus is mildly distended its mid to upper portion. Esophagus otherwise unremarkable. Lungs/Pleura: Elevated left hemidiaphragm. Mild atelectasis noted in the posteromedial left lower lobe with minimal dependent left upper lobe atelectasis. Mild atelectasis noted in the dependent right lower lobe and base of the right middle lobe. No evidence of pneumonia. No pulmonary edema. No mass or suspicious nodule. No pleural effusion or pneumothorax. Musculoskeletal: No fracture or acute finding. No osteoblastic or osteolytic lesions. Review of the MIP images confirms the above findings. CTA ABDOMEN AND PELVIS FINDINGS VASCULAR Aorta: Aorta is tortuous with mild atherosclerosis of its infrarenal portion. No aneurysm. No dissection. Celiac: Patent without evidence of aneurysm, dissection, vasculitis or significant stenosis. SMA: Patent without evidence of aneurysm, dissection, vasculitis or significant stenosis. Renals: Both renal arteries are patent without evidence of aneurysm, dissection, vasculitis, fibromuscular dysplasia or significant stenosis. IMA: Patent without evidence of aneurysm, dissection, vasculitis or significant stenosis. Inflow: Mild atherosclerosis along the common iliac arteries. No significant stenosis. Mild right common femoral artery atherosclerosis without stenosis. Veins: No obvious venous abnormality within the limitations of this arterial phase study. Review of the MIP images confirms the above findings. NON-VASCULAR Hepatobiliary: No focal liver abnormality is seen. No gallstones, gallbladder wall thickening, or biliary dilatation. Pancreas: Unremarkable. No pancreatic ductal dilatation or surrounding inflammatory changes. Spleen: Normal in size without focal abnormality.  Adrenals/Urinary Tract: No adrenal mass. Kidneys normal in orientation and position. Bilateral renal cortical thinning. 3.9 cm lower pole right renal cyst. Subcentimeter low-density left renal masses also likely cysts. These findings are stable. No stones. No hydronephrosis. Ureters normal in course and in caliber. Bladder is unremarkable. Stomach/Bowel: Rectum is significantly distended with stool, measuring 8 cm in diameter. There is significant increased stool noted throughout the colon. No colonic wall thickening or inflammation. There is a bowel anastomosis staple line along the proximal sigmoid colon. Stomach is unremarkable. Small bowel is normal in caliber with no wall thickening or inflammation. Lymphatic: No pathologically enlarged lymph nodes. Reproductive: Status post prostatectomy. Other: Small amount of soft tissue or fluid noted along the right inguinal canal similar to prior exam. No ascites. Musculoskeletal: No fracture or acute finding. No osteoblastic or osteolytic lesions. Review of the MIP images confirms the above findings. IMPRESSION: 1. No aortic dissection or aneurysm. Thoracic aorta with minor atherosclerosis. Aortic arch branch vessels are widely patent abdominal aorta shows mild atherosclerosis and tortuosity. Abdominal aortic branch vessels are widely patent. 2. No acute findings within the chest, abdomen or pelvis. 3. In the chest there is mild dependent atelectasis. No pneumonia or pulmonary edema. 4. In the abdomen and pelvis, there is marked increased stool distending the rectum and and sigmoid colon with significant increased stool throughout the remainder of the colon. No bowel obstruction or inflammation. Electronically Signed   By: Lajean Manes M.D.   On: 09/23/2018 21:23    Procedures Procedures (including critical care time)  Medications Ordered in ED Medications  iopamidol (ISOVUE-370) 76 % injection (has no administration in time range)  iopamidol (ISOVUE-370) 76 %  injection 100 mL (100 mLs Intravenous Contrast Given 09/23/18 2034)     Initial Impression / Assessment and Plan / ED Course  I have reviewed the triage vital signs and the nursing notes.  Pertinent labs & imaging results that were available during my care of the patient were reviewed by me and considered in my medical decision making (see chart for details).     Is an 85 year old male who presents above-stated history exam.  On presentation patient is afebrile stable vital signs.  Exam as above remarkable for a gentleman that does not appear to be in acute distress but does have some right upper quadrant abdominal tenderness.  He does not follow any commands and is not oriented but per family this is his neurological baseline.  ECG shows sinus rhythm with normal intervals with a ventricular rate of 58 and a right bundle branch block.  This right bundle branch block is not new compared to prior tracings.  No other signs of acute ischemic change.  In addition troponin is undetectable.  I have low suspicion for ACS.  CMP shows no significant electrolyte or metabolic abnormalities.  CBC shows a WBC count of 4.5, hemoglobin 13.4, platelets 187.  UA did show moderate leukocyte esterase with more than 50 WBCs so we will cover with Keflex.  I will suspicion for pneumonia, aortic dissection, PE, appendicitis, diverticulitis, perforated viscus, or other significant acute intrathoracic abdominal pathology given CTA chest abdomen pelvis showed no findings suggestive of any of these pathologies but did show some significant stool burden consistent with constipation.  In addition CT head did not show any acute intracranial findings.  Patient discharged in stable condition to care of family members who stated they would take him back to their home.  Stated they would follow-up with follow-up with PCP in 3 to 5 days.   Final Clinical Impressions(s) / ED Diagnoses   Final diagnoses:  Syncope and collapse  Acute  cystitis without hematuria    ED Discharge Orders         Ordered    cephALEXin (KEFLEX) 500 MG capsule  4 times daily     09/23/18 2303           Hulan Saas, MD 09/23/18 1657    Elnora Morrison, MD 09/24/18 0028

## 2018-09-23 NOTE — ED Notes (Signed)
Patient verbalizes understanding of discharge instructions. Opportunity for questioning and answers were provided. Armband removed by staff, pt discharged from ED.  

## 2018-09-23 NOTE — ED Triage Notes (Signed)
Pt arrives via EMS with reports of pt sitting on toilet and told family that he needed to go to the hospital, then his eyes rolled back but never fell or passed out. Family reports he was normal immediately after. Hx of strokes and parkinsons.

## 2018-09-25 ENCOUNTER — Telehealth: Payer: Self-pay | Admitting: Internal Medicine

## 2018-09-25 DIAGNOSIS — R531 Weakness: Secondary | ICD-10-CM | POA: Diagnosis not present

## 2018-09-25 DIAGNOSIS — I509 Heart failure, unspecified: Secondary | ICD-10-CM | POA: Diagnosis not present

## 2018-09-25 DIAGNOSIS — R001 Bradycardia, unspecified: Secondary | ICD-10-CM | POA: Diagnosis not present

## 2018-09-25 DIAGNOSIS — G20 Parkinson's disease: Secondary | ICD-10-CM | POA: Diagnosis not present

## 2018-09-25 DIAGNOSIS — I110 Hypertensive heart disease with heart failure: Secondary | ICD-10-CM | POA: Diagnosis not present

## 2018-09-25 DIAGNOSIS — F0280 Dementia in other diseases classified elsewhere without behavioral disturbance: Secondary | ICD-10-CM | POA: Diagnosis not present

## 2018-09-25 NOTE — Telephone Encounter (Signed)
Pt went into ER this weekend for UTI they some medicine, she is having questions regarding pt [phone]

## 2018-09-25 NOTE — Telephone Encounter (Signed)
I agree with only treating him twice a day with keflex. The recommended dose is actually 500mg  twice a day for a UTI.  I believe he has the 500mg  tablets already and would not need a new Rx, he should take this for 7 days total. Could you please pass this on to [name]?

## 2018-09-25 NOTE — Telephone Encounter (Signed)
Returned phone call, VM obtained, generic message left which states "[facility] returning call and to call back and ask for triage nurse" [place], RN,BSN

## 2018-09-25 NOTE — Telephone Encounter (Signed)
Returned call to [name], informed her to give pt Keflex 500mg  twice daily for 7 days, she verbalized understanding.   SChaplin, RN,BSN

## 2018-09-25 NOTE — Telephone Encounter (Signed)
Received phone call from pt's daughter, [name].  States pt was given Keflex 500mg  4 times daily at ED visit on 09/23/17.  Was told to give Keflex Q 6 hours, does not want to wake pt up to give meds.  Pt's only received 2 doses thus far.  Requesting another RX for once/twice day dosing.  Pt on coumadin. Will route to pcp. Thank you, SChaplin, RN,BSN

## 2018-09-26 DIAGNOSIS — I509 Heart failure, unspecified: Secondary | ICD-10-CM | POA: Diagnosis not present

## 2018-09-26 DIAGNOSIS — G20 Parkinson's disease: Secondary | ICD-10-CM | POA: Diagnosis not present

## 2018-09-26 DIAGNOSIS — F0280 Dementia in other diseases classified elsewhere without behavioral disturbance: Secondary | ICD-10-CM | POA: Diagnosis not present

## 2018-09-26 DIAGNOSIS — I110 Hypertensive heart disease with heart failure: Secondary | ICD-10-CM | POA: Diagnosis not present

## 2018-09-26 DIAGNOSIS — R531 Weakness: Secondary | ICD-10-CM | POA: Diagnosis not present

## 2018-09-26 DIAGNOSIS — R001 Bradycardia, unspecified: Secondary | ICD-10-CM | POA: Diagnosis not present

## 2018-09-27 DIAGNOSIS — I110 Hypertensive heart disease with heart failure: Secondary | ICD-10-CM | POA: Diagnosis not present

## 2018-09-27 DIAGNOSIS — F0280 Dementia in other diseases classified elsewhere without behavioral disturbance: Secondary | ICD-10-CM | POA: Diagnosis not present

## 2018-09-27 DIAGNOSIS — G20 Parkinson's disease: Secondary | ICD-10-CM | POA: Diagnosis not present

## 2018-09-27 DIAGNOSIS — R001 Bradycardia, unspecified: Secondary | ICD-10-CM | POA: Diagnosis not present

## 2018-09-27 DIAGNOSIS — R531 Weakness: Secondary | ICD-10-CM | POA: Diagnosis not present

## 2018-09-27 DIAGNOSIS — I509 Heart failure, unspecified: Secondary | ICD-10-CM | POA: Diagnosis not present

## 2018-09-28 DIAGNOSIS — R001 Bradycardia, unspecified: Secondary | ICD-10-CM | POA: Diagnosis not present

## 2018-09-28 DIAGNOSIS — G20 Parkinson's disease: Secondary | ICD-10-CM | POA: Diagnosis not present

## 2018-09-28 DIAGNOSIS — R531 Weakness: Secondary | ICD-10-CM | POA: Diagnosis not present

## 2018-09-28 DIAGNOSIS — I509 Heart failure, unspecified: Secondary | ICD-10-CM | POA: Diagnosis not present

## 2018-09-28 DIAGNOSIS — F0280 Dementia in other diseases classified elsewhere without behavioral disturbance: Secondary | ICD-10-CM | POA: Diagnosis not present

## 2018-09-28 DIAGNOSIS — I110 Hypertensive heart disease with heart failure: Secondary | ICD-10-CM | POA: Diagnosis not present

## 2018-09-29 ENCOUNTER — Other Ambulatory Visit: Payer: Self-pay | Admitting: Pharmacist

## 2018-09-29 DIAGNOSIS — I509 Heart failure, unspecified: Secondary | ICD-10-CM | POA: Diagnosis not present

## 2018-09-29 DIAGNOSIS — R001 Bradycardia, unspecified: Secondary | ICD-10-CM | POA: Diagnosis not present

## 2018-09-29 DIAGNOSIS — G20 Parkinson's disease: Secondary | ICD-10-CM | POA: Diagnosis not present

## 2018-09-29 DIAGNOSIS — Z86711 Personal history of pulmonary embolism: Secondary | ICD-10-CM

## 2018-09-29 DIAGNOSIS — F0280 Dementia in other diseases classified elsewhere without behavioral disturbance: Secondary | ICD-10-CM | POA: Diagnosis not present

## 2018-09-29 DIAGNOSIS — Z7902 Long term (current) use of antithrombotics/antiplatelets: Secondary | ICD-10-CM

## 2018-09-29 DIAGNOSIS — I110 Hypertensive heart disease with heart failure: Secondary | ICD-10-CM | POA: Diagnosis not present

## 2018-09-29 DIAGNOSIS — R531 Weakness: Secondary | ICD-10-CM | POA: Diagnosis not present

## 2018-10-02 ENCOUNTER — Encounter: Payer: Self-pay | Admitting: Pharmacist

## 2018-10-02 ENCOUNTER — Ambulatory Visit (INDEPENDENT_AMBULATORY_CARE_PROVIDER_SITE_OTHER): Payer: Medicare Other | Admitting: Pharmacist

## 2018-10-02 DIAGNOSIS — I509 Heart failure, unspecified: Secondary | ICD-10-CM | POA: Diagnosis not present

## 2018-10-02 DIAGNOSIS — Z7901 Long term (current) use of anticoagulants: Secondary | ICD-10-CM | POA: Diagnosis not present

## 2018-10-02 DIAGNOSIS — Z86711 Personal history of pulmonary embolism: Secondary | ICD-10-CM | POA: Diagnosis not present

## 2018-10-02 DIAGNOSIS — Z5181 Encounter for therapeutic drug level monitoring: Secondary | ICD-10-CM

## 2018-10-02 DIAGNOSIS — I110 Hypertensive heart disease with heart failure: Secondary | ICD-10-CM | POA: Diagnosis not present

## 2018-10-02 DIAGNOSIS — Z7902 Long term (current) use of antithrombotics/antiplatelets: Secondary | ICD-10-CM

## 2018-10-02 DIAGNOSIS — R001 Bradycardia, unspecified: Secondary | ICD-10-CM | POA: Diagnosis not present

## 2018-10-02 DIAGNOSIS — G20 Parkinson's disease: Secondary | ICD-10-CM | POA: Diagnosis not present

## 2018-10-02 DIAGNOSIS — R531 Weakness: Secondary | ICD-10-CM | POA: Diagnosis not present

## 2018-10-02 DIAGNOSIS — F0280 Dementia in other diseases classified elsewhere without behavioral disturbance: Secondary | ICD-10-CM | POA: Diagnosis not present

## 2018-10-02 LAB — POCT INR: INR: 3.9 — AB (ref 2.0–3.0)

## 2018-10-02 NOTE — Progress Notes (Signed)
Anticoagulation Management [name] [name] is a 85 y.o. male who reports to the clinic for monitoring of warfarin treatment.    Indication: PE, history of; long term current use of anticoagulant.   Duration: indefinite Supervising physician: [facility] Visit History: Patient does not report signs/symptoms of bleeding or thromboembolism  Other recent changes: No diet, medications, lifestyle changes except as noted in patient findings.  Anticoagulation Episode Summary    Current INR goal:   2.0-3.0  TTR:   74.1 % (7.7 y)  Next INR check:   10/30/2018  INR from last check:   3.9! (10/02/2018)  Weekly max warfarin dose:     Target end date:   Indefinite  INR check location:   Anticoagulation Clinic  Preferred lab:     Send INR reminders to:   ANT[facility]   Indications   History of pulmonary embolism [Z86.711] Encounter for long-term use of antiplatelets/antithrombotics [Z79.02]       Comments:   History of multiple venous embolic episodes. Will continue to annual re-evaluate continued need for warfarin weighing risks vs. benefits.       Anticoagulation Care Providers    Provider Role Specialty Phone number   Bertha Stakes, MD  Internal Medicine [phone]      Allergies  Allergen Reactions  . Aricept [Donepezil Hcl] Other (See Comments)    Symptomatic Bradycardia.  . Shellfish Allergy Other (See Comments)    Causes gout Causes a flare up with Gout   Prior to Admission medications   Medication Sig Start Date End Date Taking? Authorizing Provider  acetaminophen (TYLENOL) 500 MG tablet Take 1,000 mg by mouth 2 (two) times daily.    Yes [provider]  amLODipine (NORVASC) 2.5 MG tablet TAKE 1 TABLET BY MOUTH EVERY DAY Patient taking differently: Take 2.5 mg by mouth daily.  08/29/18  Yes [name], DO  atorvastatin (LIPITOR) 10 MG tablet Take 1 tablet (10 mg total) by mouth daily at 6 PM. 06/19/18  Yes Hoffman, Rachel Moulds, DO  Ca  Phosphate-Cholecalciferol (CALCIUM/VITAMIN D3 GUMMIES) 250-350 MG-UNIT CHEW Chew 1 Dose by mouth 2 (two) times daily. 07/08/16  Yes [name], DO  carbidopa-levodopa (SINEMET IR) 25-100 MG tablet Take 1 tablet by mouth 4 (four) times daily. 05/02/18  Yes Garvin Fila, MD  loratadine (CLARITIN) 10 MG tablet Take 10 mg by mouth daily.   Yes [provider]  memantine (NAMENDA XR) 28 MG CP24 24 hr capsule TAKE 1 CAPSULE BY MOUTH EVERY DAY IN THE MORNING Patient taking differently: Take 28 mg by mouth daily.  08/09/18  Yes Garvin Fila, MD  Multiple Vitamins-Minerals (MULTIVITAMIN WITH MINERALS) tablet Take 1 tablet by mouth daily.    Yes [provider]  polyethylene glycol (MIRALAX / GLYCOLAX) packet Take 17 g by mouth daily as needed (constipation).   Yes [provider]  potassium chloride SA (KLOR-CON M20) 20 MEQ tablet Take 2 tablets (40 mEq total) by mouth daily. 06/27/18  Yes [name], DO  senna (SENOKOT) 8.6 MG TABS tablet Take 1 tablet (8.6 mg total) by mouth daily as needed for mild constipation. 07/13/16  Yes Velna Ochs, MD  STARCH-MALTO DEXTRIN ([employer] INSTANT FOOD THICKENER) POWD Take 1 Scoop by mouth daily as needed. 09/15/18  Yes Ledell Noss, MD  warfarin (COUMADIN) 5 MG tablet TAKE 1/2 TABLET BY MOUTH EVERY TUE, THU, SAT AND 1 TABLET BY MOUTH ALL OTHER DAYS Patient taking differently: Take 2.5-5 mg by mouth  See admin instructions. TAKE 1/2 TABLET BY MOUTH EVERY TUE, THU, SAT AND 1 TABLET BY MOUTH ALL OTHER DAYS 06/16/18  Yes [name], DO   Past Medical History:  Diagnosis Date  . Anxiety   . Coronary artery disease, non-occlusive    with history of MI; Cath 2008 w multivessel nonobstructive CAD  . Dementia ([place])   . Depression   . GERD (gastroesophageal reflux disease)   . Hyperlipidemia   . Hypertension   . Memory loss   . Parkinson disease ([place])   . PE (pulmonary embolism)    unprovoked PE completed 6 months of warfarin,  warfarin d/ced 02/12/2010, repeat PE 07/10/2010 after a long car ride and now on lifelong coumadin.  . Pituitary microadenoma ([place])    incidental finding CT 12/2009  . Prostate cancer ([place])   . Scoliosis   . Sigmoid volvulus ([place]) 08/02/2016  . Stroke ([place]) 03/2018  . Volvulus of colon ([place]) 06/01/2017   Social History   Socioeconomic History  . Marital status: Married    Spouse name: Not on file  . Number of children: 4  . Years of education: master's  . Highest education level: Not on file  Occupational History  . Occupation: Retired    Fish farm manager: RETIRED  Social Needs  . Financial resource strain: Not on file  . Food insecurity:    Worry: Not on file    Inability: Not on file  . Transportation needs:    Medical: Not on file    Non-medical: Not on file  Tobacco Use  . Smoking status: Never Smoker  . Smokeless tobacco: Never Used  Substance and Sexual Activity  . Alcohol use: No    Alcohol/week: 0.0 standard drinks  . Drug use: No  . Sexual activity: Never  Lifestyle  . Physical activity:    Days per week: Not on file    Minutes per session: Not on file  . Stress: Not on file  Relationships  . Social connections:    Talks on phone: Not on file    Gets together: Not on file    Attends religious service: Not on file    Active member of club or organization: Not on file    Attends meetings of clubs or organizations: Not on file    Relationship status: Not on file  Other Topics Concern  . Not on file  Social History Narrative   Lives with daughter. Wife also has severe dementia.    Drinks 1 cup of coffee a day    Family History  Problem Relation Age of Onset  . Hypertension Father        Passed away from cerebral hemorrhage at age of 38.  [name] Dementia Mother   . Hypertension Child        4 adult children  . Cervical cancer Other   . Lung cancer Other   . Stroke Other   . Heart attack Neg Hx     ASSESSMENT Recent Results: The most recent result is  correlated with 25 mg per week: Lab Results  Component Value Date   INR 3.9 (A) 10/02/2018   INR 2.22 09/23/2018   INR 2.07 09/10/2018    Anticoagulation Dosing: Description   Give one-half (1/2) of  his 5mg  peach-colored warfarin every day--EXCEPT on  [place] and FRIDAYS--give one (1) tablet of his 5mg  peach-colored warfarin tablets on Wednesdays and Fridays.      INR today: Supra-therapeutic.   PLAN Weekly dose was decreased  by 10% to 22.5 mg per week  Patient Instructions  Patients daughter instructed to give medications as defined in the Anti-coagulation Track section of this encounter.  Patients daughter  instructed to givetoday's dose.  Patients daughter  instructed to give one-half (1/2) of  his 5mg  peach-colored warfarin every day--EXCEPT on  [facility] and FRIDAYS--give one (1) tablet of his 5mg  peach-colored warfarin tablets on Wednesdays and Fridays.  Patients daughter verbalized understanding of these instructions.     Patient advised to contact clinic or seek medical attention if signs/symptoms of bleeding or thromboembolism occur.  Patient verbalized understanding by repeating back information and was advised to contact me if further medication-related questions arise. Patient was also provided an information handout.  Follow-up Return in 4 weeks (on 10/30/2018) for Follow up INR at [place], PharmD, CPP  15 minutes spent face-to-face with the patient during the encounter. 50% of time spent on education. 50% of time was spent on fingerstick point of care INR sample collection, processing, results determination, dose adjustment and documentation in http://www.kim.net/.

## 2018-10-02 NOTE — Patient Instructions (Signed)
Patients daughter instructed to give medications as defined in the Anti-coagulation Track section of this encounter.  Patients daughter  instructed to givetoday's dose.  Patients daughter  instructed to give one-half (1/2) of  his 5mg  peach-colored warfarin every day--EXCEPT on  [facility] and FRIDAYS--give one (1) tablet of his 5mg  peach-colored warfarin tablets on Wednesdays and Fridays.  Patients daughter verbalized understanding of these instructions.

## 2018-10-03 DIAGNOSIS — F0280 Dementia in other diseases classified elsewhere without behavioral disturbance: Secondary | ICD-10-CM | POA: Diagnosis not present

## 2018-10-03 DIAGNOSIS — I110 Hypertensive heart disease with heart failure: Secondary | ICD-10-CM | POA: Diagnosis not present

## 2018-10-03 DIAGNOSIS — G20 Parkinson's disease: Secondary | ICD-10-CM | POA: Diagnosis not present

## 2018-10-03 DIAGNOSIS — I509 Heart failure, unspecified: Secondary | ICD-10-CM | POA: Diagnosis not present

## 2018-10-03 DIAGNOSIS — R001 Bradycardia, unspecified: Secondary | ICD-10-CM | POA: Diagnosis not present

## 2018-10-03 DIAGNOSIS — R531 Weakness: Secondary | ICD-10-CM | POA: Diagnosis not present

## 2018-10-04 DIAGNOSIS — F0280 Dementia in other diseases classified elsewhere without behavioral disturbance: Secondary | ICD-10-CM | POA: Diagnosis not present

## 2018-10-04 DIAGNOSIS — G20 Parkinson's disease: Secondary | ICD-10-CM | POA: Diagnosis not present

## 2018-10-04 DIAGNOSIS — R001 Bradycardia, unspecified: Secondary | ICD-10-CM | POA: Diagnosis not present

## 2018-10-04 DIAGNOSIS — I110 Hypertensive heart disease with heart failure: Secondary | ICD-10-CM | POA: Diagnosis not present

## 2018-10-04 DIAGNOSIS — R531 Weakness: Secondary | ICD-10-CM | POA: Diagnosis not present

## 2018-10-04 DIAGNOSIS — I509 Heart failure, unspecified: Secondary | ICD-10-CM | POA: Diagnosis not present

## 2018-10-05 DIAGNOSIS — G20 Parkinson's disease: Secondary | ICD-10-CM | POA: Diagnosis not present

## 2018-10-05 DIAGNOSIS — F0280 Dementia in other diseases classified elsewhere without behavioral disturbance: Secondary | ICD-10-CM | POA: Diagnosis not present

## 2018-10-05 DIAGNOSIS — I509 Heart failure, unspecified: Secondary | ICD-10-CM | POA: Diagnosis not present

## 2018-10-05 DIAGNOSIS — R001 Bradycardia, unspecified: Secondary | ICD-10-CM | POA: Diagnosis not present

## 2018-10-05 DIAGNOSIS — I110 Hypertensive heart disease with heart failure: Secondary | ICD-10-CM | POA: Diagnosis not present

## 2018-10-05 DIAGNOSIS — R531 Weakness: Secondary | ICD-10-CM | POA: Diagnosis not present

## 2018-10-06 DIAGNOSIS — R001 Bradycardia, unspecified: Secondary | ICD-10-CM | POA: Diagnosis not present

## 2018-10-06 DIAGNOSIS — G20 Parkinson's disease: Secondary | ICD-10-CM | POA: Diagnosis not present

## 2018-10-06 DIAGNOSIS — R531 Weakness: Secondary | ICD-10-CM | POA: Diagnosis not present

## 2018-10-06 DIAGNOSIS — I110 Hypertensive heart disease with heart failure: Secondary | ICD-10-CM | POA: Diagnosis not present

## 2018-10-06 DIAGNOSIS — F0280 Dementia in other diseases classified elsewhere without behavioral disturbance: Secondary | ICD-10-CM | POA: Diagnosis not present

## 2018-10-06 DIAGNOSIS — I509 Heart failure, unspecified: Secondary | ICD-10-CM | POA: Diagnosis not present

## 2018-10-09 DIAGNOSIS — G20 Parkinson's disease: Secondary | ICD-10-CM | POA: Diagnosis not present

## 2018-10-09 DIAGNOSIS — R531 Weakness: Secondary | ICD-10-CM | POA: Diagnosis not present

## 2018-10-09 DIAGNOSIS — F0280 Dementia in other diseases classified elsewhere without behavioral disturbance: Secondary | ICD-10-CM | POA: Diagnosis not present

## 2018-10-09 DIAGNOSIS — I110 Hypertensive heart disease with heart failure: Secondary | ICD-10-CM | POA: Diagnosis not present

## 2018-10-09 DIAGNOSIS — I509 Heart failure, unspecified: Secondary | ICD-10-CM | POA: Diagnosis not present

## 2018-10-09 DIAGNOSIS — R001 Bradycardia, unspecified: Secondary | ICD-10-CM | POA: Diagnosis not present

## 2018-10-10 DIAGNOSIS — I509 Heart failure, unspecified: Secondary | ICD-10-CM | POA: Diagnosis not present

## 2018-10-10 DIAGNOSIS — I110 Hypertensive heart disease with heart failure: Secondary | ICD-10-CM | POA: Diagnosis not present

## 2018-10-10 DIAGNOSIS — G20 Parkinson's disease: Secondary | ICD-10-CM | POA: Diagnosis not present

## 2018-10-10 DIAGNOSIS — F0280 Dementia in other diseases classified elsewhere without behavioral disturbance: Secondary | ICD-10-CM | POA: Diagnosis not present

## 2018-10-10 DIAGNOSIS — R531 Weakness: Secondary | ICD-10-CM | POA: Diagnosis not present

## 2018-10-10 DIAGNOSIS — R001 Bradycardia, unspecified: Secondary | ICD-10-CM | POA: Diagnosis not present

## 2018-10-11 DIAGNOSIS — I110 Hypertensive heart disease with heart failure: Secondary | ICD-10-CM | POA: Diagnosis not present

## 2018-10-11 DIAGNOSIS — G20 Parkinson's disease: Secondary | ICD-10-CM | POA: Diagnosis not present

## 2018-10-11 DIAGNOSIS — R001 Bradycardia, unspecified: Secondary | ICD-10-CM | POA: Diagnosis not present

## 2018-10-11 DIAGNOSIS — I509 Heart failure, unspecified: Secondary | ICD-10-CM | POA: Diagnosis not present

## 2018-10-11 DIAGNOSIS — R531 Weakness: Secondary | ICD-10-CM | POA: Diagnosis not present

## 2018-10-11 DIAGNOSIS — F0280 Dementia in other diseases classified elsewhere without behavioral disturbance: Secondary | ICD-10-CM | POA: Diagnosis not present

## 2018-10-17 DIAGNOSIS — R001 Bradycardia, unspecified: Secondary | ICD-10-CM | POA: Diagnosis not present

## 2018-10-17 DIAGNOSIS — I110 Hypertensive heart disease with heart failure: Secondary | ICD-10-CM | POA: Diagnosis not present

## 2018-10-17 DIAGNOSIS — R531 Weakness: Secondary | ICD-10-CM | POA: Diagnosis not present

## 2018-10-17 DIAGNOSIS — F0280 Dementia in other diseases classified elsewhere without behavioral disturbance: Secondary | ICD-10-CM | POA: Diagnosis not present

## 2018-10-17 DIAGNOSIS — G20 Parkinson's disease: Secondary | ICD-10-CM | POA: Diagnosis not present

## 2018-10-17 DIAGNOSIS — I509 Heart failure, unspecified: Secondary | ICD-10-CM | POA: Diagnosis not present

## 2018-10-19 ENCOUNTER — Ambulatory Visit: Payer: Medicare HMO | Admitting: Adult Health

## 2018-10-19 DIAGNOSIS — R531 Weakness: Secondary | ICD-10-CM | POA: Diagnosis not present

## 2018-10-19 DIAGNOSIS — G20 Parkinson's disease: Secondary | ICD-10-CM | POA: Diagnosis not present

## 2018-10-19 DIAGNOSIS — F0280 Dementia in other diseases classified elsewhere without behavioral disturbance: Secondary | ICD-10-CM | POA: Diagnosis not present

## 2018-10-19 DIAGNOSIS — I509 Heart failure, unspecified: Secondary | ICD-10-CM | POA: Diagnosis not present

## 2018-10-19 DIAGNOSIS — R001 Bradycardia, unspecified: Secondary | ICD-10-CM | POA: Diagnosis not present

## 2018-10-19 DIAGNOSIS — I110 Hypertensive heart disease with heart failure: Secondary | ICD-10-CM | POA: Diagnosis not present

## 2018-10-21 DIAGNOSIS — I110 Hypertensive heart disease with heart failure: Secondary | ICD-10-CM | POA: Diagnosis not present

## 2018-10-21 DIAGNOSIS — I509 Heart failure, unspecified: Secondary | ICD-10-CM | POA: Diagnosis not present

## 2018-10-21 DIAGNOSIS — M1A00X0 Idiopathic chronic gout, unspecified site, without tophus (tophi): Secondary | ICD-10-CM | POA: Diagnosis not present

## 2018-10-21 DIAGNOSIS — G20 Parkinson's disease: Secondary | ICD-10-CM | POA: Diagnosis not present

## 2018-10-21 DIAGNOSIS — R001 Bradycardia, unspecified: Secondary | ICD-10-CM | POA: Diagnosis not present

## 2018-10-21 DIAGNOSIS — R32 Unspecified urinary incontinence: Secondary | ICD-10-CM | POA: Diagnosis not present

## 2018-10-21 DIAGNOSIS — R531 Weakness: Secondary | ICD-10-CM | POA: Diagnosis not present

## 2018-10-21 DIAGNOSIS — Z515 Encounter for palliative care: Secondary | ICD-10-CM | POA: Diagnosis not present

## 2018-10-21 DIAGNOSIS — R269 Unspecified abnormalities of gait and mobility: Secondary | ICD-10-CM | POA: Diagnosis not present

## 2018-10-21 DIAGNOSIS — R159 Full incontinence of feces: Secondary | ICD-10-CM | POA: Diagnosis not present

## 2018-10-21 DIAGNOSIS — F0280 Dementia in other diseases classified elsewhere without behavioral disturbance: Secondary | ICD-10-CM | POA: Diagnosis not present

## 2018-10-23 ENCOUNTER — Other Ambulatory Visit: Payer: Self-pay

## 2018-10-23 ENCOUNTER — Encounter (HOSPITAL_COMMUNITY): Payer: Self-pay

## 2018-10-23 ENCOUNTER — Inpatient Hospital Stay (HOSPITAL_COMMUNITY)
Admission: EM | Admit: 2018-10-23 | Discharge: 2018-10-28 | DRG: 640 | Disposition: A | Discharge status: Home / Self Care | Payer: Medicare Other | Attending: Oncology | Admitting: Oncology

## 2018-10-23 ENCOUNTER — Emergency Department (HOSPITAL_COMMUNITY): Payer: Medicare Other

## 2018-10-23 DIAGNOSIS — R001 Bradycardia, unspecified: Secondary | ICD-10-CM | POA: Diagnosis not present

## 2018-10-23 DIAGNOSIS — Z86711 Personal history of pulmonary embolism: Secondary | ICD-10-CM

## 2018-10-23 DIAGNOSIS — Z79899 Other long term (current) drug therapy: Secondary | ICD-10-CM

## 2018-10-23 DIAGNOSIS — R000 Tachycardia, unspecified: Secondary | ICD-10-CM | POA: Diagnosis not present

## 2018-10-23 DIAGNOSIS — R32 Unspecified urinary incontinence: Secondary | ICD-10-CM | POA: Diagnosis present

## 2018-10-23 DIAGNOSIS — I4510 Unspecified right bundle-branch block: Secondary | ICD-10-CM | POA: Diagnosis not present

## 2018-10-23 DIAGNOSIS — E878 Other disorders of electrolyte and fluid balance, not elsewhere classified: Secondary | ICD-10-CM | POA: Diagnosis present

## 2018-10-23 DIAGNOSIS — R404 Transient alteration of awareness: Secondary | ICD-10-CM | POA: Diagnosis not present

## 2018-10-23 DIAGNOSIS — Z888 Allergy status to other drugs, medicaments and biological substances status: Secondary | ICD-10-CM | POA: Diagnosis not present

## 2018-10-23 DIAGNOSIS — R1310 Dysphagia, unspecified: Secondary | ICD-10-CM | POA: Diagnosis present

## 2018-10-23 DIAGNOSIS — Z801 Family history of malignant neoplasm of trachea, bronchus and lung: Secondary | ICD-10-CM

## 2018-10-23 DIAGNOSIS — D638 Anemia in other chronic diseases classified elsewhere: Secondary | ICD-10-CM | POA: Diagnosis present

## 2018-10-23 DIAGNOSIS — I252 Old myocardial infarction: Secondary | ICD-10-CM

## 2018-10-23 DIAGNOSIS — Z91013 Allergy to seafood: Secondary | ICD-10-CM | POA: Diagnosis not present

## 2018-10-23 DIAGNOSIS — F329 Major depressive disorder, single episode, unspecified: Secondary | ICD-10-CM | POA: Diagnosis present

## 2018-10-23 DIAGNOSIS — R4182 Altered mental status, unspecified: Secondary | ICD-10-CM

## 2018-10-23 DIAGNOSIS — E785 Hyperlipidemia, unspecified: Secondary | ICD-10-CM | POA: Diagnosis present

## 2018-10-23 DIAGNOSIS — G309 Alzheimer's disease, unspecified: Secondary | ICD-10-CM | POA: Diagnosis not present

## 2018-10-23 DIAGNOSIS — F419 Anxiety disorder, unspecified: Secondary | ICD-10-CM | POA: Diagnosis present

## 2018-10-23 DIAGNOSIS — G20 Parkinson's disease: Secondary | ICD-10-CM | POA: Diagnosis not present

## 2018-10-23 DIAGNOSIS — I110 Hypertensive heart disease with heart failure: Secondary | ICD-10-CM | POA: Diagnosis not present

## 2018-10-23 DIAGNOSIS — K219 Gastro-esophageal reflux disease without esophagitis: Secondary | ICD-10-CM | POA: Diagnosis present

## 2018-10-23 DIAGNOSIS — I2510 Atherosclerotic heart disease of native coronary artery without angina pectoris: Secondary | ICD-10-CM | POA: Diagnosis present

## 2018-10-23 DIAGNOSIS — I69391 Dysphagia following cerebral infarction: Secondary | ICD-10-CM

## 2018-10-23 DIAGNOSIS — G9341 Metabolic encephalopathy: Secondary | ICD-10-CM | POA: Diagnosis not present

## 2018-10-23 DIAGNOSIS — E871 Hypo-osmolality and hyponatremia: Secondary | ICD-10-CM | POA: Diagnosis not present

## 2018-10-23 DIAGNOSIS — E8809 Other disorders of plasma-protein metabolism, not elsewhere classified: Secondary | ICD-10-CM | POA: Diagnosis present

## 2018-10-23 DIAGNOSIS — I10 Essential (primary) hypertension: Secondary | ICD-10-CM

## 2018-10-23 DIAGNOSIS — Z87440 Personal history of urinary (tract) infections: Secondary | ICD-10-CM | POA: Diagnosis not present

## 2018-10-23 DIAGNOSIS — Z823 Family history of stroke: Secondary | ICD-10-CM

## 2018-10-23 DIAGNOSIS — I5022 Chronic systolic (congestive) heart failure: Secondary | ICD-10-CM | POA: Diagnosis present

## 2018-10-23 DIAGNOSIS — Z9049 Acquired absence of other specified parts of digestive tract: Secondary | ICD-10-CM

## 2018-10-23 DIAGNOSIS — Z8546 Personal history of malignant neoplasm of prostate: Secondary | ICD-10-CM | POA: Diagnosis not present

## 2018-10-23 DIAGNOSIS — I509 Heart failure, unspecified: Secondary | ICD-10-CM | POA: Diagnosis not present

## 2018-10-23 DIAGNOSIS — R531 Weakness: Secondary | ICD-10-CM | POA: Diagnosis not present

## 2018-10-23 DIAGNOSIS — Z8249 Family history of ischemic heart disease and other diseases of the circulatory system: Secondary | ICD-10-CM

## 2018-10-23 DIAGNOSIS — E860 Dehydration: Secondary | ICD-10-CM | POA: Diagnosis present

## 2018-10-23 DIAGNOSIS — D539 Nutritional anemia, unspecified: Secondary | ICD-10-CM | POA: Diagnosis not present

## 2018-10-23 DIAGNOSIS — E870 Hyperosmolality and hypernatremia: Secondary | ICD-10-CM | POA: Diagnosis present

## 2018-10-23 DIAGNOSIS — F0280 Dementia in other diseases classified elsewhere without behavioral disturbance: Secondary | ICD-10-CM | POA: Diagnosis present

## 2018-10-23 DIAGNOSIS — Z7901 Long term (current) use of anticoagulants: Secondary | ICD-10-CM

## 2018-10-23 DIAGNOSIS — E876 Hypokalemia: Secondary | ICD-10-CM | POA: Diagnosis present

## 2018-10-23 LAB — CBC WITH DIFFERENTIAL/PLATELET
Abs Immature Granulocytes: 0.01 10*3/uL (ref 0.00–0.07)
Basophils Absolute: 0 10*3/uL (ref 0.0–0.1)
Basophils Relative: 1 %
Eosinophils Absolute: 0.2 10*3/uL (ref 0.0–0.5)
Eosinophils Relative: 6 %
HCT: 47.9 % (ref 39.0–52.0)
Hemoglobin: 13.6 g/dL (ref 13.0–17.0)
Immature Granulocytes: 0 %
Lymphocytes Relative: 35 %
Lymphs Abs: 1.5 10*3/uL (ref 0.7–4.0)
MCH: 29.7 pg (ref 26.0–34.0)
MCHC: 28.4 g/dL — ABNORMAL LOW (ref 30.0–36.0)
MCV: 104.6 fL — ABNORMAL HIGH (ref 80.0–100.0)
Monocytes Absolute: 0.3 10*3/uL (ref 0.1–1.0)
Monocytes Relative: 8 %
Neutro Abs: 2.2 10*3/uL (ref 1.7–7.7)
Neutrophils Relative %: 50 %
Platelets: 130 10*3/uL — ABNORMAL LOW (ref 150–400)
RBC: 4.58 MIL/uL (ref 4.22–5.81)
RDW: 17.4 % — ABNORMAL HIGH (ref 11.5–15.5)
WBC: 4.3 10*3/uL (ref 4.0–10.5)
nRBC: 0 % (ref 0.0–0.2)

## 2018-10-23 LAB — COMPREHENSIVE METABOLIC PANEL
ALBUMIN: 3.1 g/dL — AB (ref 3.5–5.0)
ALT: 26 U/L (ref 0–44)
AST: 87 U/L — ABNORMAL HIGH (ref 15–41)
Alkaline Phosphatase: 63 U/L (ref 38–126)
Anion gap: 6 (ref 5–15)
BUN: 26 mg/dL — AB (ref 8–23)
CO2: 28 mmol/L (ref 22–32)
Calcium: 9.1 mg/dL (ref 8.9–10.3)
Chloride: 126 mmol/L — ABNORMAL HIGH (ref 98–111)
Creatinine, Ser: 1.07 mg/dL (ref 0.61–1.24)
GFR calc Af Amer: >60 mL/min (ref >60)
GFR calc non Af Amer: >60 mL/min (ref >60)
GLUCOSE: 146 mg/dL — AB (ref 70–99)
Potassium: 4.9 mmol/L (ref 3.5–5.1)
Sodium: 160 mmol/L — ABNORMAL HIGH (ref 135–145)
Total Bilirubin: 0.8 mg/dL (ref 0.3–1.2)
Total Protein: 6.8 g/dL (ref 6.5–8.1)

## 2018-10-23 LAB — TSH: TSH: 0.986 u[IU]/mL (ref 0.350–4.500)

## 2018-10-23 LAB — I-STAT TROPONIN, ED: Troponin i, poc: 0.04 ng/mL (ref 0.00–0.08)

## 2018-10-23 LAB — AMMONIA: Ammonia: 42 umol/L — ABNORMAL HIGH (ref 9–35)

## 2018-10-23 LAB — PROTIME-INR
INR: 2.78
PROTHROMBIN TIME: 29 s — AB (ref 11.4–15.2)

## 2018-10-23 LAB — LACTIC ACID, PLASMA: Lactic Acid, Venous: 1.6 mmol/L (ref 0.5–1.9)

## 2018-10-23 MED ORDER — ACETAMINOPHEN 325 MG PO TABS: 650.0000 mg | ORAL_TABLET | Freq: Four times a day (QID) | ORAL | Status: DC | PRN

## 2018-10-23 MED ORDER — SENNOSIDES-DOCUSATE SODIUM 8.6-50 MG PO TABS
1.0000 | ORAL_TABLET | Freq: Every evening | ORAL | Status: DC | PRN
  Administered 2018-10-28: 1 via ORAL
  Filled 2018-10-23: qty 1

## 2018-10-23 MED ORDER — SODIUM CHLORIDE 0.9 % IV BOLUS
500.0000 mL | Freq: Once | INTRAVENOUS | Status: AC
  Administered 2018-10-23: 500 mL via INTRAVENOUS

## 2018-10-23 MED ORDER — SODIUM CHLORIDE 0.9 % IV BOLUS
1000.0000 mL | Freq: Once | INTRAVENOUS | Status: DC
  Administered 2018-10-23: 1000 mL via INTRAVENOUS

## 2018-10-23 MED ORDER — ACETAMINOPHEN 650 MG RE SUPP: 650.0000 mg | Freq: Four times a day (QID) | RECTAL | Status: DC | PRN

## 2018-10-23 MED ORDER — ENOXAPARIN SODIUM 40 MG/0.4ML ~~LOC~~ SOLN: 40.0000 mg | Freq: Every day | SUBCUTANEOUS | Status: DC

## 2018-10-23 MED ORDER — DEXTROSE 5 % IV SOLN
INTRAVENOUS | Status: AC
  Administered 2018-10-24 (×2): via INTRAVENOUS
  Administered 2018-10-24: 125 mL/h via INTRAVENOUS
  Administered 2018-10-25: 06:00:00 via INTRAVENOUS

## 2018-10-23 NOTE — ED Provider Notes (Signed)
[facility] EMERGENCY DEPARTMENT Provider Note   CSN: [id number] Arrival date & time: 10/23/18  2039     History   Chief Complaint Chief Complaint  Patient presents with  . Altered Mental Status    HPI [name] [name] is a 85 y.o. male.  Patient is an 85 year old male with a history of stroke, dementia, Parkinson's disease, PE who is on lifelong Coumadin who currently lives at home with his family with around-the-clock care presenting today with increased lethargy and sleeping all the time.  Daughter states things started to decline on Friday.  He was his normal self on Thursday.  Normally he is able to walk with a 2 person assistance and he will talk intermittently but since Saturday he has just been sleeping.  He is really not eaten much of anything and she states his whole body seems weak.  Today they had to use a wheelchair because he was unable to get out of bed.  He has not had cough, congestion, vomiting.  Last bowel movement was 3 days ago which she states is not completely abnormal.  He has had swallowing issues for quite some time but has not had any recent choking episodes.  She has checked his temperature and he has not been febrile.  He does tend to get urinary tract infections regularly and was last treated at the beginning of January.  He has had no falls or brain injuries.  He is denied any chest pain and she has not noticed him being short of breath.  He has had no recent medication changes.  The history is provided by the patient.  Altered Mental Status  Presenting symptoms: lethargy and partial responsiveness   Severity:  Moderate Most recent episode:  2 days ago Episode history:  Continuous Timing:  Constant Progression:  Worsening Chronicity:  New   Past Medical History:  Diagnosis Date  . Anxiety   . Coronary artery disease, non-occlusive    with history of MI; Cath 2008 w multivessel nonobstructive CAD  . Dementia ([place])   . Depression   .  GERD (gastroesophageal reflux disease)   . Hyperlipidemia   . Hypertension   . Memory loss   . Parkinson disease ([place])   . PE (pulmonary embolism)    unprovoked PE completed 6 months of warfarin, warfarin d/ced 02/12/2010, repeat PE 07/10/2010 after a long car ride and now on lifelong coumadin.  . Pituitary microadenoma ([place])    incidental finding CT 12/2009  . Prostate cancer ([place])   . Scoliosis   . Sigmoid volvulus ([place]) 08/02/2016  . Stroke ([place]) 03/2018  . Volvulus of colon ([place]) 06/01/2017    Patient Active Problem List   Diagnosis Date Noted  . Ecchymosis 08/07/2018  . Pituitary macroadenoma ([place]) 06/06/2018  . Hemorrhagic stroke ([place])   . Prediabetes   . Dysphagia, post-stroke   . Benign essential HTN   . Anemia of chronic disease   . ICH (intracerebral hemorrhage) ([place]) 05/05/2018  . Chalazion left upper eyelid 04/11/2018  . Lethargy 11/10/2017  . Fatigue 09/08/2017  . Need for immunization against influenza 09/08/2017  . Recurrent UTI 06/27/2017  . Pressure sore on buttocks 06/27/2017  . Volvulus of sigmoid colon ([place]) 05/31/2017  . Aortic atherosclerosis ([place]) 05/31/2017  . Loss of weight 09/23/2016  . Long term current use of anticoagulant   . Right bundle branch block   . Hypokalemia 08/02/2016  . Diverticulosis 07/15/2016  . Statin myopathy 03/15/2016  . Parkinsonian syndrome (  [place]) 08/11/2015  . Normocytic anemia, not due to blood loss 04/16/2015  . Chronic systolic heart failure ([place]) 04/16/2015  . Falls frequently 04/16/2015  . Onychomycosis 04/15/2015  . CAD (coronary artery disease) 01/16/2015  . 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 Healthcare maintenance 10/10/2014  . Lumbosacral spondylosis without myelopathy 09/24/2013  . Bradycardia 02/20/2013  . Generalized ischemic cerebrovascular disease 02/05/2013  . Depression 01/25/2013  . Insomnia 01/20/2012  . Alzheimer's dementia ([place]) 08/17/2011  . History of pulmonary embolism 09/18/2010  . Encounter for  long-term use of antiplatelets/antithrombotics 09/18/2010  . DJD (degenerative joint disease) 08/06/2010  . ADENOCARCINOMA, PROSTATE, HX OF 08/06/2010  . HLD (hyperlipidemia) 10/15/2009  . Essential hypertension, benign 10/15/2009  . GERD 10/15/2009    Past Surgical History:  Procedure Laterality Date  . CATARACT EXTRACTION    . COLONOSCOPY N/A 05/30/2017   Procedure: COLONOSCOPY;  Surgeon: Ronald Lobo, MD;  Location: [facility] ENDOSCOPY;  Service: Endoscopy;  Laterality: N/A;  . FLEXIBLE SIGMOIDOSCOPY N/A 08/02/2016   Procedure: FLEXIBLE SIGMOIDOSCOPY;  Surgeon: Ronald Lobo, MD;  Location: [facility] ENDOSCOPY;  Service: Endoscopy;  Laterality: N/A;  . FLEXIBLE SIGMOIDOSCOPY N/A 08/06/2016   Procedure: FLEXIBLE SIGMOIDOSCOPY;  Surgeon: Ronald Lobo, MD;  Location: [facility] ENDOSCOPY;  Service: Endoscopy;  Laterality: N/A;  . FLEXIBLE SIGMOIDOSCOPY Left 06/02/2017   Procedure: FLEXIBLE SIGMOIDOSCOPY;  Surgeon: Arta Silence, MD;  Location: [facility] ENDOSCOPY;  Service: Endoscopy;  Laterality: Left;  . FLEXIBLE SIGMOIDOSCOPY N/A 06/07/2017   Procedure: FLEXIBLE SIGMOIDOSCOPY for possible decompression;  Surgeon: Otis Brace, MD;  Location: [place];  Service: Gastroenterology;  Laterality: N/A;  . LEFT HEART CATHETERIZATION WITH CORONARY ANGIOGRAM N/A 12/10/2014   Procedure: LEFT HEART CATHETERIZATION WITH CORONARY ANGIOGRAM;  Surgeon: [name], MD;  Location: [facility] CATH LAB;  Service: Cardiovascular;  Laterality: N/A;  . LOOP RECORDER IMPLANT N/A 12/16/2014   Procedure: LOOP RECORDER IMPLANT;  Surgeon: [name], MD;  Location: [facility] CATH LAB;  Service: Cardiovascular;  Laterality: N/A;  . PARTIAL COLECTOMY N/A 06/08/2017   Procedure: SIGMOID  COLECTOMY;  Surgeon: Georganna Skeans, MD;  Location: [place];  Service: General;  Laterality: N/A;  . PROSTATECTOMY          Home Medications    Prior to Admission medications   Medication Sig Start Date End Date Taking? Authorizing Provider    acetaminophen (TYLENOL) 500 MG tablet Take 1,000 mg by mouth 2 (two) times daily.     [provider]  amLODipine (NORVASC) 2.5 MG tablet TAKE 1 TABLET BY MOUTH EVERY DAY Patient taking differently: Take 2.5 mg by mouth daily.  08/29/18   [name], DO  atorvastatin (LIPITOR) 10 MG tablet Take 1 tablet (10 mg total) by mouth daily at 6 PM. 06/19/18   [name], DO  Ca Phosphate-Cholecalciferol (CALCIUM/VITAMIN D3 GUMMIES) 250-350 MG-UNIT CHEW Chew 1 Dose by mouth 2 (two) times daily. 07/08/16   [name], DO  carbidopa-levodopa (SINEMET IR) 25-100 MG tablet Take 1 tablet by mouth 4 (four) times daily. 05/02/18   Garvin Fila, MD  loratadine (CLARITIN) 10 MG tablet Take 10 mg by mouth daily.    [provider]  memantine (NAMENDA XR) 28 MG CP24 24 hr capsule TAKE 1 CAPSULE BY MOUTH EVERY DAY IN THE MORNING Patient taking differently: Take 28 mg by mouth daily.  08/09/18   Garvin Fila, MD  Multiple Vitamins-Minerals (MULTIVITAMIN WITH MINERALS) tablet Take 1 tablet by mouth daily.     [provider]  polyethylene  glycol (MIRALAX / GLYCOLAX) packet Take 17 g by mouth daily as needed (constipation).    [provider]  potassium chloride SA (KLOR-CON M20) 20 MEQ tablet Take 2 tablets (40 mEq total) by mouth daily. 06/27/18   [name], DO  senna (SENOKOT) 8.6 MG TABS tablet Take 1 tablet (8.6 mg total) by mouth daily as needed for mild constipation. 07/13/16   Velna Ochs, MD  STARCH-MALTO DEXTRIN ([employer] INSTANT FOOD THICKENER) POWD Take 1 Scoop by mouth daily as needed. 09/15/18   Ledell Noss, MD  warfarin (COUMADIN) 5 MG tablet TAKE 1/2 TABLET BY MOUTH EVERY TUE, THU, SAT AND 1 TABLET BY MOUTH ALL OTHER DAYS Patient taking differently: Take 2.5-5 mg by mouth See admin instructions. TAKE 1/2 TABLET BY MOUTH EVERY TUE, THU, SAT AND 1 TABLET BY MOUTH ALL OTHER DAYS 06/16/18   [name], DO    Family History Family History   Problem Relation Age of Onset  . Hypertension Father        Passed away from cerebral hemorrhage at age of 64.  [name] Dementia Mother   . Hypertension Child        4 adult children  . Cervical cancer Other   . Lung cancer Other   . Stroke Other   . Heart attack Neg Hx     Social History Social History   Tobacco Use  . Smoking status: Never Smoker  . Smokeless tobacco: Never Used  Substance Use Topics  . Alcohol use: No    Alcohol/week: 0.0 standard drinks  . Drug use: No     Allergies   Aricept [donepezil hcl] and Shellfish allergy   Review of Systems Review of Systems  All other systems reviewed and are negative.    Physical Exam Updated Vital Signs BP (!) 128/97 (BP Location: Right Arm)   Pulse 82   Temp 98.9 F (37.2 C) (Rectal)   Resp 12   Ht 5\' 8"  (1.727 m)   Wt 60 kg   SpO2 97%   BMI 20.11 kg/m   Physical Exam Vitals signs and nursing note reviewed.  Constitutional:      General: He is not in acute distress.    Appearance: He is well-developed.     Comments: Somnolent and only slightly moans and changes position with stimulation  HENT:     Head: Normocephalic and atraumatic.     Mouth/Throat:     Mouth: Mucous membranes are dry.  Eyes:     Conjunctiva/sclera: Conjunctivae normal.     Pupils: Pupils are equal, round, and reactive to light.  Neck:     Musculoskeletal: Normal range of motion and neck supple.  Cardiovascular:     Rate and Rhythm: Normal rate and regular rhythm.     Pulses: Normal pulses.     Heart sounds: No murmur.  Pulmonary:     Effort: Pulmonary effort is normal. No respiratory distress.     Breath sounds: Normal breath sounds. No wheezing or rales.  Abdominal:     General: There is no distension.     Palpations: Abdomen is soft.     Tenderness: There is no abdominal tenderness. There is no guarding or rebound.  Musculoskeletal: Normal range of motion.        General: No tenderness.     Right lower leg: No edema.      Left lower leg: No edema.  Skin:    General: Skin is warm and dry.  Findings: No erythema or rash.  Neurological:     Mental Status: He is lethargic.     Comments: Minimally responsive.  Not following commands  Psychiatric:        Speech: He is noncommunicative.      ED Treatments / Results  Labs (all labs ordered are listed, but only abnormal results are displayed) Labs Reviewed  CBC WITH DIFFERENTIAL/PLATELET - Abnormal; Notable for the following components:      Result Value   MCV 104.6 (*)    MCHC 28.4 (*)    RDW 17.4 (*)    Platelets 130 (*)    All other components within normal limits  COMPREHENSIVE METABOLIC PANEL - Abnormal; Notable for the following components:   Sodium 160 (*)    Chloride 126 (*)    Glucose, Bld 146 (*)    BUN 26 (*)    Albumin 3.1 (*)    AST 87 (*)    All other components within normal limits  AMMONIA - Abnormal; Notable for the following components:   Ammonia 42 (*)    All other components within normal limits  PROTIME-INR - Abnormal; Notable for the following components:   Prothrombin Time 29.0 (*)    All other components within normal limits  URINE CULTURE  LACTIC ACID, PLASMA  TSH  URINALYSIS, ROUTINE W REFLEX MICROSCOPIC  I-STAT TROPONIN, ED    EKG None  Radiology Ct Head Wo Contrast  Result Date: 10/23/2018 CLINICAL DATA:  Altered level of consciousness with drastic decline since Friday. Generalized weakness period no expression on face. Voice is weak. Patient had a fall on Friday. EXAM: CT HEAD WITHOUT CONTRAST TECHNIQUE: Contiguous axial images were obtained from the base of the skull through the vertex without intravenous contrast. COMPARISON:  09/23/2018 FINDINGS: Brain: Motion artifact limits examination. Diffuse cerebral atrophy. Ventricular dilatation consistent with central atrophy. Low-attenuation changes in the deep white matter consistent with small vessel ischemia. Old lacunar infarct in the right thalamus. No  mass-effect or midline shift. No abnormal extra-axial fluid collections. Gray-white matter junctions are distinct. Basal cisterns are not effaced. No acute intracranial hemorrhage. Vascular: Moderate intracranial arterial vascular calcifications. Skull: Calvarium appears intact. No acute depressed skull fractures. Sinuses/Orbits: Paranasal sinuses and mastoid air cells are clear. Other: None. IMPRESSION: No acute intracranial abnormalities. Chronic atrophy and small vessel ischemic changes. Electronically Signed   By: Lucienne Capers M.D.   On: 10/23/2018 22:59   Dg Chest Port 1 View  Result Date: 10/23/2018 CLINICAL DATA:  Altered mental status and weakness today. EXAM: PORTABLE CHEST 1 VIEW COMPARISON:  09/23/2018 FINDINGS: Shallow inspiration with linear atelectasis in the left lung base. Heart appears mildly enlarged. No developing airspace disease or consolidation. Similar appearance to prior study. A loop recorder is present. Calcification of the aorta. Degenerative changes in the spine and shoulders. IMPRESSION: Shallow inspiration with linear atelectasis in the left lung base. Electronically Signed   By: Lucienne Capers M.D.   On: 10/23/2018 22:09    Procedures Procedures (including critical care time)  Medications Ordered in ED Medications  sodium chloride 0.9 % bolus 500 mL (has no administration in time range)     Initial Impression / Assessment and Plan / ED Course  I have reviewed the triage vital signs and the nursing notes.  Pertinent labs & imaging results that were available during my care of the patient were reviewed by me and considered in my medical decision making (see chart for details).     Elderly  male presenting today with altered mental status.  He is lethargic and somnolent on exam.  Does not feel febrile and has not had specific infectious symptoms such as cough, vomiting or diarrhea.  He does tend to get urinary tract infections and daughter is not sure if his  urine has changed.  He has had no recent medication changes.  He is in no distress on exam but is only minimally arousable with stimulation.  He has had no medication changes and family is actually stopped giving his and his blood pressure medicine for the last week because of normal blood pressures.  They also have stopped giving him all of his medications that could make him drowsy without improvement. We will do CT of the head to ensure no evidence of bleed or large stroke, EKG to rule out silent MI, CBC, CMP, ammonia, chest x-ray and UA.  Patient was given IV bolus.  11:13 PM CBC without acute findings, ammonia mildly elevated at 42, CMP with hypernatremia of 160 but normal renal function but also hyperchloremia.  Chest x-ray without acute findings, troponin within normal limits.  Head CT without acute findings.  Feel patient symptoms are related to dehydration because he has had poor oral intake.  He was given IV fluids.  He does receive some palliative care services at home but daughter states they are unable to check his sodium there.  She wishes that he be admitted for IV fluids.  Will consult internal medicine who is his primary.  Final Clinical Impressions(s) / ED Diagnoses   Final diagnoses:  Dehydration  Altered mental status, unspecified altered mental status type  Hypernatremia    ED Discharge Orders    None       Blanchie Dessert, MD 10/23/18 2315

## 2018-10-23 NOTE — ED Triage Notes (Signed)
Since Friday patient's family reports drastic decline. No expressions on face, voice weak, pt weak in general, deep sleeps all the time and hard to arouse. Arrives via [facility]. VSS CBG 158

## 2018-10-24 DIAGNOSIS — E860 Dehydration: Secondary | ICD-10-CM

## 2018-10-24 DIAGNOSIS — R4182 Altered mental status, unspecified: Secondary | ICD-10-CM

## 2018-10-24 DIAGNOSIS — Z86711 Personal history of pulmonary embolism: Secondary | ICD-10-CM

## 2018-10-24 DIAGNOSIS — Z7901 Long term (current) use of anticoagulants: Secondary | ICD-10-CM

## 2018-10-24 DIAGNOSIS — I10 Essential (primary) hypertension: Secondary | ICD-10-CM

## 2018-10-24 DIAGNOSIS — G20 Parkinson's disease: Secondary | ICD-10-CM

## 2018-10-24 DIAGNOSIS — F0280 Dementia in other diseases classified elsewhere without behavioral disturbance: Secondary | ICD-10-CM

## 2018-10-24 DIAGNOSIS — E870 Hyperosmolality and hypernatremia: Principal | ICD-10-CM

## 2018-10-24 DIAGNOSIS — E878 Other disorders of electrolyte and fluid balance, not elsewhere classified: Secondary | ICD-10-CM

## 2018-10-24 DIAGNOSIS — G309 Alzheimer's disease, unspecified: Secondary | ICD-10-CM

## 2018-10-24 LAB — BASIC METABOLIC PANEL
Anion gap: 6 (ref 5–15)
Anion gap: 6 (ref 5–15)
Anion gap: 4 — ABNORMAL LOW (ref 5–15)
Anion gap: 9 (ref 5–15)
BUN: 16 mg/dL (ref 8–23)
BUN: 19 mg/dL (ref 8–23)
BUN: 18 mg/dL (ref 8–23)
BUN: 15 mg/dL (ref 8–23)
CO2: 26 mmol/L (ref 22–32)
CO2: 27 mmol/L (ref 22–32)
CO2: 29 mmol/L (ref 22–32)
CO2: 24 mmol/L (ref 22–32)
Calcium: 8.4 mg/dL — ABNORMAL LOW (ref 8.9–10.3)
Calcium: 8.7 mg/dL — ABNORMAL LOW (ref 8.9–10.3)
Calcium: 8.6 mg/dL — ABNORMAL LOW (ref 8.9–10.3)
Calcium: 8.6 mg/dL — ABNORMAL LOW (ref 8.9–10.3)
Chloride: 125 mmol/L — ABNORMAL HIGH (ref 98–111)
Chloride: 125 mmol/L — ABNORMAL HIGH (ref 98–111)
Chloride: 122 mmol/L — ABNORMAL HIGH (ref 98–111)
Chloride: 119 mmol/L — ABNORMAL HIGH (ref 98–111)
Creatinine, Ser: 0.89 mg/dL (ref 0.61–1.24)
Creatinine, Ser: 0.97 mg/dL (ref 0.61–1.24)
Creatinine, Ser: 0.97 mg/dL (ref 0.61–1.24)
Creatinine, Ser: 0.9 mg/dL (ref 0.61–1.24)
GFR calc Af Amer: >60 mL/min (ref >60)
GFR calc Af Amer: >60 mL/min (ref >60)
GFR calc Af Amer: >60 mL/min (ref >60)
GFR calc Af Amer: >60 mL/min (ref >60)
GFR calc non Af Amer: >60 mL/min (ref >60)
GLUCOSE: 101 mg/dL — AB (ref 70–99)
GLUCOSE: 114 mg/dL — AB (ref 70–99)
GLUCOSE: 112 mg/dL — AB (ref 70–99)
GLUCOSE: 110 mg/dL — AB (ref 70–99)
Potassium: 3.8 mmol/L (ref 3.5–5.1)
Potassium: 3.9 mmol/L (ref 3.5–5.1)
Potassium: 3.8 mmol/L (ref 3.5–5.1)
Potassium: 3.9 mmol/L (ref 3.5–5.1)
Sodium: 157 mmol/L — ABNORMAL HIGH (ref 135–145)
Sodium: 158 mmol/L — ABNORMAL HIGH (ref 135–145)
Sodium: 155 mmol/L — ABNORMAL HIGH (ref 135–145)
Sodium: 152 mmol/L — ABNORMAL HIGH (ref 135–145)

## 2018-10-24 LAB — MRSA PCR SCREENING: MRSA by PCR: NEGATIVE

## 2018-10-24 LAB — URINALYSIS, ROUTINE W REFLEX MICROSCOPIC
BILIRUBIN URINE: NEGATIVE
Bacteria, UA: NONE SEEN
Glucose, UA: NEGATIVE mg/dL
Hgb urine dipstick: NEGATIVE
KETONES UR: 5 mg/dL — AB
Nitrite: NEGATIVE
Protein, ur: NEGATIVE mg/dL
Specific Gravity, Urine: 1.025 (ref 1.005–1.030)
pH: 5 (ref 5.0–8.0)

## 2018-10-24 LAB — COMPREHENSIVE METABOLIC PANEL
ALBUMIN: 2.8 g/dL — AB (ref 3.5–5.0)
ALT: 24 U/L (ref 0–44)
AST: 70 U/L — ABNORMAL HIGH (ref 15–41)
Alkaline Phosphatase: 54 U/L (ref 38–126)
Anion gap: 8 (ref 5–15)
BUN: 20 mg/dL (ref 8–23)
CO2: 27 mmol/L (ref 22–32)
Calcium: 8.5 mg/dL — ABNORMAL LOW (ref 8.9–10.3)
Chloride: 126 mmol/L — ABNORMAL HIGH (ref 98–111)
Creatinine, Ser: 0.94 mg/dL (ref 0.61–1.24)
GFR calc Af Amer: >60 mL/min (ref >60)
GFR calc non Af Amer: >60 mL/min (ref >60)
GLUCOSE: 167 mg/dL — AB (ref 70–99)
Potassium: 4.2 mmol/L (ref 3.5–5.1)
SODIUM: 161 mmol/L — AB (ref 135–145)
Total Bilirubin: 0.2 mg/dL — ABNORMAL LOW (ref 0.3–1.2)
Total Protein: 6.4 g/dL — ABNORMAL LOW (ref 6.5–8.1)

## 2018-10-24 LAB — OSMOLALITY, URINE: Osmolality, Ur: 781 mOsm/kg (ref 300–900)

## 2018-10-24 LAB — CBC
HCT: 42.7 % (ref 39.0–52.0)
Hemoglobin: 12.2 g/dL — ABNORMAL LOW (ref 13.0–17.0)
MCH: 29.3 pg (ref 26.0–34.0)
MCHC: 28.6 g/dL — ABNORMAL LOW (ref 30.0–36.0)
MCV: 102.4 fL — ABNORMAL HIGH (ref 80.0–100.0)
Platelets: 130 10*3/uL — ABNORMAL LOW (ref 150–400)
RBC: 4.17 MIL/uL — ABNORMAL LOW (ref 4.22–5.81)
RDW: 17.2 % — AB (ref 11.5–15.5)
WBC: 4 10*3/uL (ref 4.0–10.5)
nRBC: 0 % (ref 0.0–0.2)

## 2018-10-24 LAB — SODIUM, URINE, RANDOM: Sodium, Ur: 30 mmol/L

## 2018-10-24 LAB — VITAMIN B12: Vitamin B-12: 572 pg/mL (ref 180–914)

## 2018-10-24 LAB — SAVE SMEAR(SSMR), FOR PROVIDER SLIDE REVIEW

## 2018-10-24 NOTE — ED Notes (Signed)
Pt continues to be very hard to arouse, only to very painful stimuli, pt will open eyes but does not respond. Vital signs stable

## 2018-10-24 NOTE — ED Notes (Signed)
Pt withdraws from pain but no verbal response.

## 2018-10-24 NOTE — Progress Notes (Signed)
Pt woke up when touched and spoke a bit with Care giver about numbers. Still disoriented x 4  but more easily aroused.

## 2018-10-24 NOTE — Progress Notes (Addendum)
   Subjective: sleeping comfortably in bed this morning. Not arousable but withdrawals to pain. Daughter at bedside. Discussed decline in mental status that occurred over 2-3 days and our plans to treat him hypernatremia. Daughter mentioned wanting to discuss feeding tube for discharge since he has dysphagia and decreased po intake. We will discuss this more later.   Objective:  Vital signs in last 24 hours: Vitals:   10/24/18 0515 10/24/18 0545 10/24/18 0630 10/24/18 0700  BP: 101/76 108/76 127/89 108/81  Pulse: 71 74 75 73  Resp: 13 14 13 14   Temp:      TempSrc:      SpO2: 97% 97% 97% 98%  Weight:      Height:       Gen: sleeping comfortably in bed, not arousable, NAD HENT: dry MM, PERL Cardiac: RRR, no m/r/g, no JVD Pulm: anterior lung fields CTAB, mouth breathing on RA Abd: soft, NT, ND,active BS Neuro: not alert, sound asleep. Withdraws to pain equally in all four extremities. No clonus. Patellar reflexes are absent but symmetric. No cogwheel rigidity  Skin: warm, slightly diaphoretic but is under several blankets    Assessment/Plan:  Active Problems:   Hypernatremia  87yoM with alzheimer's dementia, parkinson's disease, CVA with residual dysphagia presenting with AMS and found to have severe hypernatremia.   Hypernatremia: 160 on admission, 161 on recheck. Most likely 2/2 decreased po intake and dehydration. Urine studies showed normal osmolality so doubt central diabetes insipidus.   - Continue D5W and recheck BMP q4h  - Goal Na reduction is 10nmol/L in 24 hrs, roughly 150nmol/L  Demenita, Parkinson's, Dysphagia: hold home sinemet, namenda until mental status improves  HTN: normotensive. hold home amlodipine   PE: hold warfarin since NPO. INR currently > 2.0 - daily INR, if INR <2, will start lovenox for dvt ppx if still npo. If tolerating po, can resume home warfarin   CVA: resume lipitor once AMS improves  H/o recurrent UTIs: UA does not look  infected   Dispo: Anticipated discharge in approximately 3-5 day(s).   Isabelle Course, MD 10/24/2018, 10:21 AM Pager: [phone]

## 2018-10-24 NOTE — Progress Notes (Signed)
[name] [name] is a 85 y.o. male patient admitted from ED. VSS - Blood pressure (!) 132/94, pulse 62, temperature (!) 97.4 F (36.3 C), temperature source Oral, resp. rate 14, height 5\' 8"  (1.727 m), weight 60 kg, SpO2 100 %.  IV in place, occlusive dsg intact without redness.  Pt asleep, only responds to painful stimuli. Daughter and caregiver at bedside. Admission INP armband ID verified with patient/family, and in place.   SR up x 2, fall assessment complete. Family able to verbalize understanding of risk associated with falls, and verbalized understanding.Call light within reach. Skin, clean-dry- intact without evidence of bruising, or skin tears.   No evidence of skin break down noted on exam.     Will cont to eval and treat per MD orders.  Tama High, RN 10/24/2018 4:34 PM

## 2018-10-24 NOTE — ED Notes (Signed)
Pt resting with eyes closed and does not rouse to vigorous stimuli ,patting right shoulder. Respirations even and non labored with lungs bilaterally clear. Peripheral pulses positive x 4. Daughter at bedside

## 2018-10-24 NOTE — H&P (Addendum)
Date: 10/24/2018               Patient Name:  [name] [name] MRN: [id number]  DOB: [birth year] Age / Sex: 85 y.o., male   PCP: [name] Groves, DO         Medical Service: Internal Medicine Teaching Service         Attending Physician: Dr. Annia Belt, MD    First Contact: Dr. Donne [name] Pager: [phone]  Second Contact: Dr. Trilby [name] Pager: [phone]       After Hours (After 5p/  First Contact Pager: [phone]  weekends / holidays): Second Contact Pager: [phone]   Chief Complaint: Altered mental status, dehydration  History of Present Illness:  *History obtained from chart review, ED provider and son [name] [name]*  [name] is an 85 year old gentleman with Alzheimer's dementia, Parkinson's disease, CVA with residual dysphagia, nonobstructive CAD, hypertension, hyperlipidemia, history of prostate cancer s/p prostatectomy, pituitary microadenoma, depression, pulmonary embolism on lifelong Coumadin who presented to [facility] emergency department via EMS and accompanied by daughter with increased lethargy and somnolence since Friday.  [name] typically requires assistance with all activities of daily living however per the daughter and son, he has been non-expressive and has not been himself.  Usually he can express few words intermittently and recognize faces.  He has had decreased p.o. intake.  In regards to his Parkinson's and Alzheimer's dementia, he does not have the typical tremor or bradykinesia.  He receives care from CRNA in the morning and family takes [facility] in the evening.  Family also denies fevers, chills, cough, congestion, nausea or vomiting.   ED course: Vital signs stable, CMP with significant hypernatremia of 160, elevated AST of 87, hypoalbuminemia of 3.1, elevated BUN of 26, lactic acid unremarkable, mildly elevated ammonia at 42.   Meds:  Current Meds  Medication Sig  . acetaminophen (TYLENOL) 500 MG tablet Take 1,000 mg by mouth 2 (two) times  daily.   [name] amLODipine (NORVASC) 2.5 MG tablet TAKE 1 TABLET BY MOUTH EVERY DAY (Patient taking differently: Take 2.5 mg by mouth daily. )  . atorvastatin (LIPITOR) 10 MG tablet Take 1 tablet (10 mg total) by mouth daily at 6 PM.  . Ca Phosphate-Cholecalciferol (CALCIUM/VITAMIN D3 GUMMIES) 250-350 MG-UNIT CHEW Chew 1 Dose by mouth 2 (two) times daily.  . carbidopa-levodopa (SINEMET IR) 25-100 MG tablet Take 1 tablet by mouth 4 (four) times daily.  [name] loratadine (CLARITIN) 10 MG tablet Take 10 mg by mouth daily.  . memantine (NAMENDA XR) 28 MG CP24 24 hr capsule TAKE 1 CAPSULE BY MOUTH EVERY DAY IN THE MORNING (Patient taking differently: Take 28 mg by mouth daily. )  . Multiple Vitamins-Minerals (MULTIVITAMIN WITH MINERALS) tablet Take 1 tablet by mouth daily.   . polyethylene glycol (MIRALAX / GLYCOLAX) packet Take 17 g by mouth daily as needed (constipation).  . potassium chloride SA (KLOR-CON M20) 20 MEQ tablet Take 2 tablets (40 mEq total) by mouth daily.  [name] senna (SENOKOT) 8.6 MG TABS tablet Take 1 tablet (8.6 mg total) by mouth daily as needed for mild constipation.  [name] STARCH-MALTO DEXTRIN ([employer] INSTANT FOOD THICKENER) POWD Take 1 Scoop by mouth daily as needed. (Patient taking differently: Take 1 Scoop by mouth daily as needed (food thickner). )  . warfarin (COUMADIN) 5 MG tablet TAKE 1/2 TABLET BY MOUTH EVERY TUE, THU, SAT AND 1 TABLET BY MOUTH ALL OTHER DAYS (Patient taking differently: Take 2.5-5 mg by mouth See admin  instructions. Take 2.5 mg everyday except Wednesday and Friday 5 mg.)     Allergies: Allergies as of 10/23/2018 - Review Complete 10/23/2018  Allergen Reaction Noted  . Aricept [donepezil hcl] Other (See Comments) 07/24/2013  . Shellfish allergy Other (See Comments) 12/01/2015   Past Medical History:  Diagnosis Date  . Anxiety   . Coronary artery disease, non-occlusive    with history of MI; Cath 2008 w multivessel nonobstructive CAD  . Dementia ([place])   . Depression     . GERD (gastroesophageal reflux disease)   . Hyperlipidemia   . Hypertension   . Memory loss   . Parkinson disease ([place])   . PE (pulmonary embolism)    unprovoked PE completed 6 months of warfarin, warfarin d/ced 02/12/2010, repeat PE 07/10/2010 after a long car ride and now on lifelong coumadin.  . Pituitary microadenoma ([place])    incidental finding CT 12/2009  . Prostate cancer ([place])   . Scoliosis   . Sigmoid volvulus ([place]) 08/02/2016  . Stroke ([place]) 03/2018  . Volvulus of colon ([place]) 06/01/2017    Family History: Unable to obtain but per son, they lost their mother about 6 months ago which happens to be a day before his parents 58th wedding anniversary.  Review of Systems: A complete ROS was negative except as per HPI.  Physical Exam: Blood pressure 132/90, pulse 78, temperature 98.9 F (37.2 C), temperature source Rectal, resp. rate 12, height 5\' 8"  (1.727 m), weight 60 kg, SpO2 98 %. Physical Exam Vitals signs and nursing note reviewed.  Constitutional:      General: He is not in acute distress.    Appearance: He is not toxic-appearing.  HENT:     Head: Normocephalic and atraumatic.     Mouth/Throat:     Mouth: Mucous membranes are dry.  Eyes:     Conjunctiva/sclera: Conjunctivae normal.  Cardiovascular:     Rate and Rhythm: Normal rate and regular rhythm.     Heart sounds: No murmur. No friction rub. No gallop.   Pulmonary:     Effort: Pulmonary effort is normal.     Breath sounds: Normal breath sounds.  Abdominal:     General: Abdomen is flat. Bowel sounds are normal. There is no distension.     Palpations: Abdomen is soft.     Tenderness: There is no abdominal tenderness.  Skin:    General: Skin is dry.  Neurological:     General: No focal deficit present.     Comments: Alert and oriented x0.    CXR: personally reviewed my interpretation is atelectasis at the left lung base  Assessment & Plan by Problem: Active Problems:   Hypernatremia  [name]  is an 85 year old gentleman with Alzheimer's dementia, Parkinson's disease, CVA with residual dysphagia altered mental status found to have severe hypernatremia  #Acute Metabolic Encephalopathy #Hypernatremia Per daughter and son, he has had several days of decrease p.o. intake.  On arrival, CMP shows hypernatremia with serum sodium of 160 with mildly elevated BUN of 26 representing prerenal etiology most likely secondary to overt dehydration.  On physical exam, mucous membranes were dry with decreased turgor on skin exam.  CT head unremarkable.  Free water deficit of 4 L. -Continue D5 W at 125 mL/hour for 36 hours -Follow-up serial BMP  #Alzheimer's dementia #Parkinson's disease #Hx of dysphagia -Hold home Sinemet, Namenda -Requires 24/7 assistance with all ADLs.  #Hypertension - Hold home amlodipine  #Pulmonary embolism on chronic Coumadin - Can hold Coumadin  for now and continue subcutaneous Lovenox - Reassuring that INR is >2  #History of CVA - Can resume Lipitor once AMS improves  #History of recurrent UTIs -Follow-up urinalysis and urine culture  #Abnormal liver transaminases -AST elevated at 87, ammonia elevated at 42 etiology remains unclear.  In regards to the elevated AST, this seems to be chronic as it is been persistent on his previous labs.  - I will repeat an ammonia in the a.m. trend labs.  FEN: Replace electrolytes as needed, n.p.o. for now VTE ppx: Cutaneous Lovenox CODE STATUS: Full code  Dispo: Admit patient to Inpatient with expected length of stay greater than 2 midnights.  Signed: Jean Rosenthal, MD 10/24/2018, 12:24 AM  Pager: [phone] IMTS PGY-1

## 2018-10-24 NOTE — ED Notes (Signed)
Report attempted 

## 2018-10-25 LAB — BASIC METABOLIC PANEL
Anion gap: 7 (ref 5–15)
Anion gap: 7 (ref 5–15)
Anion gap: 8 (ref 5–15)
Anion gap: 5 (ref 5–15)
BUN: 15 mg/dL (ref 8–23)
BUN: 14 mg/dL (ref 8–23)
BUN: 13 mg/dL (ref 8–23)
BUN: 13 mg/dL (ref 8–23)
CALCIUM: 8.3 mg/dL — AB (ref 8.9–10.3)
CALCIUM: 8.6 mg/dL — AB (ref 8.9–10.3)
CO2: 24 mmol/L (ref 22–32)
CO2: 26 mmol/L (ref 22–32)
CO2: 25 mmol/L (ref 22–32)
CO2: 28 mmol/L (ref 22–32)
Calcium: 8.3 mg/dL — ABNORMAL LOW (ref 8.9–10.3)
Calcium: 8.6 mg/dL — ABNORMAL LOW (ref 8.9–10.3)
Chloride: 120 mmol/L — ABNORMAL HIGH (ref 98–111)
Chloride: 118 mmol/L — ABNORMAL HIGH (ref 98–111)
Chloride: 113 mmol/L — ABNORMAL HIGH (ref 98–111)
Chloride: 114 mmol/L — ABNORMAL HIGH (ref 98–111)
Creatinine, Ser: 0.81 mg/dL (ref 0.61–1.24)
Creatinine, Ser: 0.79 mg/dL (ref 0.61–1.24)
Creatinine, Ser: 0.84 mg/dL (ref 0.61–1.24)
Creatinine, Ser: 0.82 mg/dL (ref 0.61–1.24)
GFR calc Af Amer: >60 mL/min (ref >60)
GFR calc Af Amer: >60 mL/min (ref >60)
GFR calc Af Amer: >60 mL/min (ref >60)
GFR calc Af Amer: >60 mL/min (ref >60)
GFR calc non Af Amer: >60 mL/min (ref >60)
GFR calc non Af Amer: >60 mL/min (ref >60)
GFR calc non Af Amer: >60 mL/min (ref >60)
GFR calc non Af Amer: >60 mL/min (ref >60)
Glucose, Bld: 118 mg/dL — ABNORMAL HIGH (ref 70–99)
Glucose, Bld: 95 mg/dL (ref 70–99)
Glucose, Bld: 105 mg/dL — ABNORMAL HIGH (ref 70–99)
Glucose, Bld: 95 mg/dL (ref 70–99)
Potassium: 3.8 mmol/L (ref 3.5–5.1)
Potassium: 3.6 mmol/L (ref 3.5–5.1)
Potassium: 3.6 mmol/L (ref 3.5–5.1)
Potassium: 3.9 mmol/L (ref 3.5–5.1)
SODIUM: 151 mmol/L — AB (ref 135–145)
Sodium: 151 mmol/L — ABNORMAL HIGH (ref 135–145)
Sodium: 146 mmol/L — ABNORMAL HIGH (ref 135–145)
Sodium: 147 mmol/L — ABNORMAL HIGH (ref 135–145)

## 2018-10-25 LAB — PROTIME-INR
INR: 2.42
PROTHROMBIN TIME: 26 s — AB (ref 11.4–15.2)

## 2018-10-25 LAB — FOLATE RBC
Folate, Hemolysate: 423 ng/mL
Folate, RBC: 1110 ng/mL (ref >498)
HEMATOCRIT: 38.1 % (ref 37.5–51.0)

## 2018-10-25 LAB — PATHOLOGIST SMEAR REVIEW

## 2018-10-25 MED ORDER — KCL IN DEXTROSE-NACL 40-5-0.45 MEQ/L-%-% IV SOLN
INTRAVENOUS | Status: DC
  Administered 2018-10-25: 1000 mL via INTRAVENOUS
  Administered 2018-10-26: 06:00:00 via INTRAVENOUS
  Filled 2018-10-25 (×2): qty 1000

## 2018-10-25 MED ORDER — ORAL CARE MOUTH RINSE
15.0000 mL | Freq: Two times a day (BID) | OROMUCOSAL | Status: DC
  Administered 2018-10-25 – 2018-10-28 (×5): 15 mL via OROMUCOSAL

## 2018-10-25 MED ORDER — DEXTROSE 5 % IV SOLN
INTRAVENOUS | Status: DC
  Administered 2018-10-25: 1000 mL via INTRAVENOUS

## 2018-10-25 MED ORDER — SODIUM CHLORIDE 0.9 % IV SOLN: INTRAVENOUS | Status: DC

## 2018-10-25 NOTE — Progress Notes (Signed)
   Subjective: Sound asleep when I walked in the room. Resting comfortably.  No one at bedside. Arousable to sternal rub, following commands, oriented to self only.   Objective:  Vital signs in last 24 hours: Vitals:   10/25/18 0020 10/25/18 0406 10/25/18 0420 10/25/18 0741  BP:  113/73  124/80  Pulse: (!) 59 (!) 59 67 (!) 59  Resp: 13 14 12 13   Temp:   97.9 F (36.6 C) 97.7 F (36.5 C)  TempSrc:   Axillary Oral  SpO2: 94% 96% 97% 98%  Weight:      Height:       Gen: resting comfortably  Neuro: arousable to sternal rub, oriented to self only, follows commands (open eyes, squeeze fingers) Responds to questioning.  Pulm: poor effort but I do not appreciate any crackles  Abd: soft, NT, ND Ext: no LEE, warm  Assessment/Plan:  Active Problems:   History of pulmonary embolus (PE)   Chronic anticoagulation   HTN (hypertension), benign   Hypernatremia  87yoM with alzheimer's dementia, parkinson's disease, CVA with residual dysphagia presenting with AMS and found to have severe hypernatremia.   Hypernatremia: Improving. Na 161 --> 151 over 24hrs. Mental status improving. Alert and oriented to self.  - continue D5W - transition to q12h BMPs - possibly try bedside swallow eval today, will discuss with nurse  Demenita, Parkinson's, Dysphagia: hold home sinemet, namenda until mental status improves  HTN: normotensive. hold home amlodipine   PE: hold warfarin since NPO. INR currently > 2.0 - daily INR, if INR <2, will start lovenox for dvt ppx if still npo. If tolerating po, can resume home warfarin  - INR 2.42 today   CVA: resume lipitor once AMS improves  Dispo: Anticipated discharge in approximately 1-2 day(s).   Isabelle Course, MD 10/25/2018, 10:08 AM Pager: [phone]

## 2018-10-25 NOTE — Discharge Summary (Signed)
Name: [name] [name] MRN: [id number] DOB: [birth year] 85 y.o. PCP: [name], DO  Date of Admission: 10/23/2018  8:39 PM Date of Discharge:  Attending Physician: Annia Belt, MD  Discharge Diagnosis: 1. Acute metabolic encephalopathy 2/2 severe hypernatremia  2. Parkinson's, Dementia, Dysphagia   Discharge Medications: Allergies as of 10/28/2018      Reactions   Aricept [donepezil Hcl] Other (See Comments)   Symptomatic Bradycardia.   Shellfish Allergy Other (See Comments)   Causes gout Causes a flare up with Gout      Medication List    STOP taking these medications   amLODipine 2.5 MG tablet Commonly known as:  NORVASC     TAKE these medications   acetaminophen 500 MG tablet Commonly known as:  TYLENOL Take 1,000 mg by mouth 2 (two) times daily.   atorvastatin 10 MG tablet Commonly known as:  LIPITOR Take 1 tablet (10 mg total) by mouth daily at 6 PM.   Ca Phosphate-Cholecalciferol 250-350 MG-UNIT Chew Commonly known as:  CALCIUM/VITAMIN D3 GUMMIES Chew 1 Dose by mouth 2 (two) times daily.   carbidopa-levodopa 25-100 MG tablet Commonly known as:  SINEMET IR Take 1 tablet by mouth 4 (four) times daily.   enoxaparin 100 MG/ML injection Commonly known as:  LOVENOX Inject 0.9 mLs (90 mg total) into the skin daily.   loratadine 10 MG tablet Commonly known as:  CLARITIN Take 10 mg by mouth daily.   memantine 28 MG Cp24 24 hr capsule Commonly known as:  NAMENDA XR TAKE 1 CAPSULE BY MOUTH EVERY DAY IN THE MORNING What changed:  See the new instructions.   multivitamin with minerals tablet Take 1 tablet by mouth daily.   polyethylene glycol packet Commonly known as:  MIRALAX / GLYCOLAX Take 17 g by mouth daily as needed (constipation).   potassium chloride SA 20 MEQ tablet Commonly known as:  KLOR-CON M20 Take 2 tablets (40 mEq total) by mouth daily.   senna 8.6 MG Tabs tablet Commonly known as:  SENOKOT Take 1 tablet (8.6 mg total) by  mouth daily as needed for mild constipation.   STARCH-MALTO DEXTRIN Powd Commonly known as:  [employer] INSTANT FOOD THICKENER Take 1 Scoop by mouth daily as needed. What changed:  reasons to take this   warfarin 5 MG tablet Commonly known as:  COUMADIN Take as directed. If you are unsure how to take this medication, talk to your nurse or doctor. Original instructions:  TAKE 1/2 TABLET BY MOUTH EVERY TUE, THU, SAT AND 1 TABLET BY MOUTH ALL OTHER DAYS What changed:    how much to take  how to take this  when to take this  additional instructions       Disposition and follow-up:   [name] [name] was discharged from [facility] in Stable condition.  At the hospital follow up visit please address:  1.  Hypernatremic 2/2 dehydration. Assess home po intake. Family considering feeding tube at some point but this is not recommended given his advanced dementia.   2. Hx of PE, On Warfarin: Only intermittently tolerating PO and INR subtherapeutic at discharge.  - Recheck INR if able to take warfarin - Prescribed treatment dose Lovenox at discharge x 7d  2.  Labs / imaging needed at time of follow-up: BMP, INR  3.  Pending labs/ test needing follow-up: none   Follow-up Appointments:   Hospital Course by problem list: [name] is an 85 year old gentleman with Alzheimer's dementia, Parkinson's disease, CVA  with residual dysphagia altered mental status found to have severe hypernatremia  1. Hypernatremia: resolved overnight with D5W. Mental status improved and was back to baseline by time of discharge. Family expressed concerns over dysphagia and ways to increase his po intake. We discouraged placing a feeding tube due to his sever dementia and data showing that feedings tube do not prolong quality or quantity of life in these situations. SLP evaluation completed and recommended dysphagia 1 diet.  All home medications were resumed on discharge, but he was only  intermittently tolerating PO.    Discharge Vitals:   BP 133/83 (BP Location: Left Arm)   Pulse (!) 57   Temp 97.7 F (36.5 C) (Axillary)   Resp 16   Ht 5\' 8"  (1.727 m)   Wt 60 kg   SpO2 99%   BMI 20.11 kg/m   Pertinent Labs, Studies, and Procedures:   Diet recommendations: Dysphagia 1 (puree);Nectar-thick liquid Liquids provided via: Cup Medication Administration: Crushed with puree Supervision: Trained caregiver to feed patient;Full supervision/cueing for compensatory strategies Compensations: Minimize environmental distractions;Slow rate;Small sips/bites Postural Changes and/or Swallow Maneuvers: Out of bed for meals;Seated upright 90 degrees;Upright 30-60 min after meal  BMP Latest Ref Rng & Units 10/28/2018 10/27/2018 10/26/2018  Glucose 70 - 99 mg/dL 76 131(H) 96  BUN 8 - 23 mg/dL 14 12 11   Creatinine 0.61 - 1.24 mg/dL 0.89 0.79 0.98  BUN/Creat Ratio 10 - 24 - - -  Sodium 135 - 145 mmol/L 143 140 147(H)  Potassium 3.5 - 5.1 mmol/L 3.3(L) 3.5 3.9  Chloride 98 - 111 mmol/L 109 107 112(H)  CO2 22 - 32 mmol/L 25 22 26   Calcium 8.9 - 10.3 mg/dL 8.4(L) 8.5(L) 8.6(L)     Discharge Instructions: Discharge Instructions    Diet - low sodium heart healthy   Complete by:  As directed    Discharge instructions   Complete by:  As directed    He was admitted to the hospital for severely high sodium level because he was dehydrated. We corrected this by giving him lots of IV fluids. He is in a difficult situation given his difficulty swallowing. Continue giving him thickened liquids at home and offer them as frequently as possible. It is important that he sit upright during feeds and for at least 30 minutes after to decrease the risk of aspiration. Please follow up with Dr. Heber Jerome for continued discussions regarding feeding tube placement. A feeding tube is not a perfect solution and comes with its own risk and benefits. I'm glad we were able to correct his sodium level and get him back to  his baseline! He is a very sweet man. Thank you for the opportunity to help care for him.  I recommend continuing to hold his blood pressure medicine (amlodipine) has is blood pressure was well controlled without any medication while he was in the hospital.  Lovenox has been prescribed to provide anticoagulation if he is unable to take Warfarin, given his INR is not therapeutic.  Please follow up in clinic on Monday if he is not improving and with in the next week if he is.   Increase activity slowly   Complete by:  As directed       Signed: Neva Seat, MD 10/28/2018, 11:35 AM   Pager: [phone]

## 2018-10-25 NOTE — Progress Notes (Signed)
Patient was transferred to Med-Surg level of care per MD order. Will continue to monitor.  

## 2018-10-25 NOTE — Progress Notes (Signed)
Medicine attending: I examined this patient today together with resident physician Dr Francesco Runner and I concur with her evaluation and management plan. He is now awake, interactive, can say a few words, can follow commands. Sodium improving.  Down to 151.  Continue hypotonic fluids.  Consider adding maintenance potassium to IV fluids and changing to D5 quarter normal saline. Renal function normalized with BUN 14 and creatinine 0.8. Stable INR. Swallowing study planned to assess safety to go back on his oral medications. Dr. Donne Hazel discussed by phone with 1 of patient's daughters.

## 2018-10-25 NOTE — Evaluation (Signed)
Clinical/Bedside Swallow Evaluation Patient Details  Name: [name] [name] MRN: [id number] Date of Birth: Oct 10, 1930  Today's Date: 10/25/2018 Time: SLP Start Time (ACUTE ONLY): 1359 SLP Stop Time (ACUTE ONLY): 1425 SLP Time Calculation (min) (ACUTE ONLY): 26 min  Past Medical History:  Past Medical History:  Diagnosis Date  . Anxiety   . Coronary artery disease, non-occlusive    with history of MI; Cath 2008 w multivessel nonobstructive CAD  . Dementia ([place])   . Depression   . GERD (gastroesophageal reflux disease)   . Hyperlipidemia   . Hypertension   . Memory loss   . Parkinson disease ([place])   . PE (pulmonary embolism)    unprovoked PE completed 6 months of warfarin, warfarin d/ced 02/12/2010, repeat PE 07/10/2010 after a long car ride and now on lifelong coumadin.  . Pituitary microadenoma ([place])    incidental finding CT 12/2009  . Prostate cancer ([place])   . Scoliosis   . Sigmoid volvulus ([place]) 08/02/2016  . Stroke ([place]) 03/2018  . Volvulus of colon ([place]) 06/01/2017   Past Surgical History:  Past Surgical History:  Procedure Laterality Date  . CATARACT EXTRACTION    . COLONOSCOPY N/A 05/30/2017   Procedure: COLONOSCOPY;  Surgeon: Ronald Lobo, MD;  Location: [facility] ENDOSCOPY;  Service: Endoscopy;  Laterality: N/A;  . FLEXIBLE SIGMOIDOSCOPY N/A 08/02/2016   Procedure: FLEXIBLE SIGMOIDOSCOPY;  Surgeon: Ronald Lobo, MD;  Location: [facility] ENDOSCOPY;  Service: Endoscopy;  Laterality: N/A;  . FLEXIBLE SIGMOIDOSCOPY N/A 08/06/2016   Procedure: FLEXIBLE SIGMOIDOSCOPY;  Surgeon: Ronald Lobo, MD;  Location: [facility] ENDOSCOPY;  Service: Endoscopy;  Laterality: N/A;  . FLEXIBLE SIGMOIDOSCOPY Left 06/02/2017   Procedure: FLEXIBLE SIGMOIDOSCOPY;  Surgeon: Arta Silence, MD;  Location: [facility] ENDOSCOPY;  Service: Endoscopy;  Laterality: Left;  . FLEXIBLE SIGMOIDOSCOPY N/A 06/07/2017   Procedure: FLEXIBLE SIGMOIDOSCOPY for possible decompression;  Surgeon: Otis Brace, MD;  Location: [place];  Service: Gastroenterology;  Laterality: N/A;  . LEFT HEART CATHETERIZATION WITH CORONARY ANGIOGRAM N/A 12/10/2014   Procedure: LEFT HEART CATHETERIZATION WITH CORONARY ANGIOGRAM;  Surgeon: [name], MD;  Location: [facility] CATH LAB;  Service: Cardiovascular;  Laterality: N/A;  . LOOP RECORDER IMPLANT N/A 12/16/2014   Procedure: LOOP RECORDER IMPLANT;  Surgeon: [name], MD;  Location: [facility] CATH LAB;  Service: Cardiovascular;  Laterality: N/A;  . PARTIAL COLECTOMY N/A 06/08/2017   Procedure: SIGMOID  COLECTOMY;  Surgeon: Georganna Skeans, MD;  Location: [place];  Service: General;  Laterality: N/A;  . PROSTATECTOMY     HPI:  Pt is an is an 85 year old male admitted with AMS from severe hypernatremia. CXR on admission without acute findings. SLP asked to assess swallowing as mentation improves given report of baseline diet including thickened liquids. MBS in 2017 recommended nectar thick liquids due to penetration and oral residue that spilled into the valleculae with thin liquids. BSE in August 2019 recommended Dys 3 diet and thin liquids. PMH: dementia, Parkinson's disease, CVA, scoliosis, GERD   Assessment / Plan / Recommendation Clinical Impression  Pt appears alert this afternoon, although he primarily keeps his eyes closed throughout the evaluation. He does not follow commands and is dependent for feeding. With Max cues he takes minimal amounts from a cup, straw, or spoon, attempting different textures, flavors, and temperatures. Oral holding is observed, as is wet vocal quality. His caregiver at bedside is not sure how much his liquids are thickened - she says that she just "adds some of the white powder." From her report, it sounds  like his most recent swallow study recommended the thicker liquids, but I do not have access to this report as it must have been done elsewhere. Question if this is not far from his baseline, but he is no taking in enough POs at this moment to help determine.  Given all of the above, recommend allowing additional time prior to starting PO diet to see if his oral acceptance/awareness improves for better clinical assessment of function. SLP Visit Diagnosis: Dysphagia, unspecified (R13.10)    Aspiration Risk  Moderate aspiration risk;Risk for inadequate nutrition/hydration    Diet Recommendation NPO   Medication Administration: Via alternative means    Other  Recommendations Oral Care Recommendations: Oral care QID   Follow up Recommendations (tba)      Frequency and Duration min 2x/week  2 weeks       Prognosis Prognosis for Safe Diet Advancement: Fair Barriers to Reach Goals: Cognitive deficits      Swallow Study   General HPI: Pt is an is an 85 year old male admitted with AMS from severe hypernatremia. CXR on admission without acute findings. SLP asked to assess swallowing as mentation improves given report of baseline diet including thickened liquids. MBS in 2017 recommended nectar thick liquids due to penetration and oral residue that spilled into the valleculae with thin liquids. BSE in August 2019 recommended Dys 3 diet and thin liquids. PMH: dementia, Parkinson's disease, CVA, scoliosis, GERD Type of Study: Bedside Swallow Evaluation Previous Swallow Assessment: see HPI Diet Prior to this Study: NPO Temperature Spikes Noted: No Respiratory Status: Room air History of Recent Intubation: No Behavior/Cognition: Alert;Doesn't follow directions Oral Cavity Assessment: Other (comment)(UTA) Oral Care Completed by SLP: No Oral Cavity - Dentition: Other (Comment)(UTA) Vision: (mostly keeps eyes closed) Self-Feeding Abilities: Total assist Patient Positioning: Upright in bed Baseline Vocal Quality: Low vocal intensity;Wet Volitional Cough: Cognitively unable to elicit Volitional Swallow: Unable to elicit    Oral/Motor/Sensory Function Overall Oral Motor/Sensory Function: Other (comment)(UTA)   Ice Chips Ice chips: Not tested   Thin  Liquid Thin Liquid: Impaired Presentation: Cup Oral Phase Functional Implications: Oral holding Pharyngeal  Phase Impairments: Wet Vocal Quality    Nectar Thick Nectar Thick Liquid: Not tested   Honey Thick Honey Thick Liquid: Not tested   Puree Puree: Impaired Presentation: Spoon Oral Phase Impairments: Poor awareness of bolus Oral Phase Functional Implications: Oral holding   Solid     Solid: Not tested      Venita Sheffield Merced Brougham 10/25/2018,2:52 PM  Germain Osgood Cameo Shewell, M.A. [place] Acute Environmental education officer [phone] Office [phone]

## 2018-10-26 LAB — BASIC METABOLIC PANEL
Anion gap: 9 (ref 5–15)
BUN: 11 mg/dL (ref 8–23)
CO2: 26 mmol/L (ref 22–32)
Calcium: 8.6 mg/dL — ABNORMAL LOW (ref 8.9–10.3)
Chloride: 112 mmol/L — ABNORMAL HIGH (ref 98–111)
Creatinine, Ser: 0.98 mg/dL (ref 0.61–1.24)
GFR calc non Af Amer: >60 mL/min (ref >60)
Glucose, Bld: 96 mg/dL (ref 70–99)
Potassium: 3.9 mmol/L (ref 3.5–5.1)
Sodium: 147 mmol/L — ABNORMAL HIGH (ref 135–145)

## 2018-10-26 LAB — PROTIME-INR
INR: 2.22
Prothrombin Time: 24.3 seconds — ABNORMAL HIGH (ref 11.4–15.2)

## 2018-10-26 MED ORDER — MEMANTINE HCL ER 28 MG PO CP24
28.0000 mg | ORAL_CAPSULE | Freq: Every day | ORAL | Status: DC
  Administered 2018-10-27 – 2018-10-28 (×2): 28 mg via ORAL
  Filled 2018-10-26 (×4): qty 1

## 2018-10-26 MED ORDER — WARFARIN SODIUM 2.5 MG PO TABS: 2.5000 mg | ORAL_TABLET | ORAL | Status: DC

## 2018-10-26 MED ORDER — ATORVASTATIN CALCIUM 10 MG PO TABS
10.0000 mg | ORAL_TABLET | Freq: Every day | ORAL | Status: DC
  Filled 2018-10-26: qty 1

## 2018-10-26 MED ORDER — WARFARIN SODIUM 5 MG PO TABS: 5.0000 mg | ORAL_TABLET | ORAL | Status: DC

## 2018-10-26 MED ORDER — CARBIDOPA-LEVODOPA 25-100 MG PO TABS
1.0000 | ORAL_TABLET | Freq: Four times a day (QID) | ORAL | Status: DC
  Administered 2018-10-26 – 2018-10-28 (×3): 1 via ORAL
  Filled 2018-10-26 (×6): qty 1

## 2018-10-26 MED ORDER — WARFARIN SODIUM 2.5 MG PO TABS
2.5000 mg | ORAL_TABLET | ORAL | Status: DC
  Administered 2018-10-26: 2.5 mg via ORAL
  Filled 2018-10-26: qty 1

## 2018-10-26 MED ORDER — WARFARIN - PHYSICIAN DOSING INPATIENT
Freq: Every day | Status: DC
  Administered 2018-10-27: 19:00:00

## 2018-10-26 MED ORDER — DEXTROSE 5 % IV SOLN
INTRAVENOUS | Status: DC
  Administered 2018-10-26 (×2): via INTRAVENOUS

## 2018-10-26 NOTE — Evaluation (Signed)
Physical Therapy Evaluation Patient Details Name: [name] [name] MRN: [id number] DOB: 06-17-1931 Today's Date: 10/26/2018   History of Present Illness  PAtient is an 85 year old male admitted from home with AMS. Found to have hyponatremia, dehydration. PMH to include: CAD, HTN, dementia, parkinson's, CHF, HLD.    Clinical Impression  Patient received in bed, no family or caregivers present. Patient awake, however unable to follow commands or answer questions. Attempting to verbalize but difficult to understand and may be confused. Patient requires total assist for all mobility at this time. Patient is unable to provide history therefore unable to determine what his baseline was prior to admission. Patient will benefit from skilled PT to address his difficulty with mobility.        Follow Up Recommendations Supervision/Assistance - 24 hour(unsure if patient will benefit from continued PT as I am unable to determine baseline at this time. )    Equipment Recommendations       Recommendations for Other Services       Precautions / Restrictions Precautions Precautions: Fall Restrictions Weight Bearing Restrictions: No      Mobility  Bed Mobility Overal bed mobility: Needs Assistance Bed Mobility: Rolling;Supine to Sit;Sit to Supine Rolling: Total assist;+2 for physical assistance   Supine to sit: Total assist;+2 for physical assistance Sit to supine: Total assist;+2 for physical assistance      Transfers                 General transfer comment: unable/unsafe to attempt this date  Ambulation/Gait             General Gait Details: unable/unsafe to attempt  Stairs            Wheelchair Mobility    Modified Rankin (Stroke Patients Only)       Balance Overall balance assessment: Needs assistance   Sitting balance-Leahy Scale: Zero                                       Pertinent Vitals/Pain Pain Assessment: Faces Faces Pain Scale:  No hurt    Home Living Family/patient expects to be discharged to:: Private residence Living Arrangements: Children Available Help at Discharge: Family;Available 24 hours/day;Personal care attendant Type of Home: House       Home Layout: Able to live on main level with bedroom/bathroom;Two level   Additional Comments: unsure, no family or caregivers present at time of evaluation.      Prior Function Level of Independence: Needs assistance   Gait / Transfers Assistance Needed: unsure if patient is ambulatory at this time at basline. No caregivers present to give information. Patient unable to provide.             Hand Dominance        Extremity/Trunk Assessment        Lower Extremity Assessment Lower Extremity Assessment: (patient does not follow commands to move extremities, therefore strength not able to be assessed, ROM WFL passively assessed)       Communication   Communication: Expressive difficulties(patient tries to verbalize but seems confused, difficult to understand)  Cognition Arousal/Alertness: Awake/alert Behavior During Therapy: Flat affect Overall Cognitive Status: No family/caregiver present to determine baseline cognitive functioning  General Comments      Exercises     Assessment/Plan    PT Assessment Patient needs continued PT services  PT Problem List Decreased strength;Decreased cognition;Decreased mobility;Decreased balance       PT Treatment Interventions Functional mobility training;Therapeutic exercise;Therapeutic activities;Neuromuscular re-education;Balance training;Patient/family education    PT Goals (Current goals can be found in the Care Plan section)  Acute Rehab PT Goals Patient Stated Goal: none stated, no family present, assume to return home Time For Goal Achievement: 11/09/18 Potential to Achieve Goals: Fair    Frequency Min 2X/week   Barriers to discharge         Co-evaluation               AM-PAC PT "6 Clicks" Mobility  Outcome Measure Help needed turning from your back to your side while in a flat bed without using bedrails?: Total Help needed moving from lying on your back to sitting on the side of a flat bed without using bedrails?: Total Help needed moving to and from a bed to a chair (including a wheelchair)?: Total Help needed standing up from a chair using your arms (e.g., wheelchair or bedside chair)?: Total Help needed to walk in hospital room?: Total Help needed climbing 3-5 steps with a railing? : Total 6 Click Score: 6    End of Session   Activity Tolerance: Other (comment)(limited by cognition, lacking ability to follow cues to perform tasks) Patient left: in bed;with bed alarm set;with call bell/phone within reach   PT Visit Diagnosis: Muscle weakness (generalized) (M62.81);Other abnormalities of gait and mobility (R26.89);History of falling (Z91.81)    Time: 1045-1100 PT Time Calculation (min) (ACUTE ONLY): 15 min   Charges:   PT Evaluation $PT Eval Moderate Complexity: 1 Mod PT Treatments $Therapeutic Activity: 8-22 mins        Trevan Messman, PT, GCS 10/26/18,11:47 AM

## 2018-10-26 NOTE — Progress Notes (Signed)
   10/26/18 0025  Clinical Encounter Type  Visited With Family  Visit Type Spiritual support  Referral From Nurse  Consult/Referral To Chaplain  Spiritual Encounters  Spiritual Needs Emotional;Grief support;Sacred text  Stress Factors  Family Stress Factors Health changes   Chaplain responded to a request from the Nurse that the daughter, [name], had requested a visit.  Pt lives with DTR in her home and she is PCG.  PT's wife, PCG's mother, died in 2023/02/14 and now her father is in the hospital.  PCG works during the day and stays here at night.  PCG seems overwhelmed.  Chaplain provided empathetic listening and words of comfort and support.  PT and his wife were Pastors and PT's two sons are Dance movement psychotherapist.  Chaplain reminded the PCG that we are here to support her.  Chaplain will pass on to Chaplain who covers that floor to check back in. PCG is worried if the PT does not improve will she be able to care for him at home.  Chaplain [name]

## 2018-10-26 NOTE — Care Management Important Message (Signed)
Important Message  Patient Details  Name: [name] [name] MRN: [id number] Date of Birth: 1930/12/08   Medicare Important Message Given:  Yes    Orbie Pyo 10/26/2018, 2:35 PM

## 2018-10-26 NOTE — Progress Notes (Signed)
Medicine attending: I examined this patient today together with resident physician Dr Francesco Runner and I concur with her evaluation and management plan. He continues to make progress.  Mental status improving.  More interactive. Sodium level has plateaued but overall significantly improved.  Renal function has normalized. Daughter at bedside.  She is a professor of biology at [employer].  We discussed the issue of getting sufficient maintenance fluids by mouth to keep him hydrated.  We think there is a reasonable potential that this will happen as he continues to improve.  We will defer decisions on risk versus benefit of a feeding tube to his primary care physician Dr. Johnnette Gourd.  Daughter is conflicted but agrees that main goal should be his comfort.

## 2018-10-26 NOTE — Progress Notes (Signed)
  Speech Language Pathology Treatment: Dysphagia  Patient Details Name: [name] [name] MRN: [id number] DOB: 09-30-1930 Today's Date: 10/26/2018 Time: 1415-1510 SLP Time Calculation (min) (ACUTE ONLY): 55 min  Assessment / Plan / Recommendation Clinical Impression  Pt is much more alert today in comparison to previous reports. Pt was sitting upright in bed awake and alert.  He is nonverbal which is his baseline per caregiver report.  Following extensive conversations with caregiver and pt's daughter (via phone) pt's baseline diet was determined to be approximately dys 1, nectar thick liquids; however, caregiver reports that she may increase liquids viscosity if pt has increased coughing or provide thinner liquid consistencies (specifically Ensure) if he is more alert.  Caregiver denies frequent hospitalizations for pneumonia despite prolonged course of dysphagia.  Pt's daughter and caregiver also provide pt with ample opportunities for out of bed mobility and/or time in wheelchair at baseline.  Pt consumed honey thick, nectar thick, and thin liquids as well as pureed solids without overt s/s of aspiration.  Swallow response appeared timely when all of pt's premorbid neurological conditions are taken into account although his caregiver reports that he does have a tendency to orally hold boluses as he becomes more fatigued or when he's no longer interested in eating or drinking at which point she uses suction to clear his oral cavity.  Overall, pt appears to be back to or very near his baseline for swallowing function per family and caregiver.  Therefore, would recommend that pt resume dys 1 diet, nectar thick liquids.  Given that pt appears predisposed to dehydration in the setting of prolonged dysphagia requiring thickened liquids, SLP discussed safe ways to incorporate small amounts of water in between meals to maximize hydration (ie water protocol: wait 30 min after eating or drinking, thorough oral care  prior to water, water only, small amounts).  Handout was provided to maximize carryover in the home environment.  All questions were answered to caregiver's satisfaction at this time.      HPI HPI: Pt is an is an 85 year old male admitted with AMS from severe hypernatremia. CXR on admission without acute findings. SLP asked to assess swallowing as mentation improves given report of baseline diet including thickened liquids. MBS in 2017 recommended nectar thick liquids due to penetration and oral residue that spilled into the valleculae with thin liquids. BSE in August 2019 recommended Dys 3 diet and thin liquids. PMH: dementia, Parkinson's disease, CVA, scoliosis, GERD      SLP Plan  Continue with current plan of care       Recommendations  Diet recommendations: Dysphagia 1 (puree);Nectar-thick liquid Liquids provided via: Cup Medication Administration: Crushed with puree Supervision: Trained caregiver to feed patient;Full supervision/cueing for compensatory strategies Compensations: Minimize environmental distractions;Slow rate;Small sips/bites Postural Changes and/or Swallow Maneuvers: Out of bed for meals;Seated upright 90 degrees;Upright 30-60 min after meal                Oral Care Recommendations: Oral care BID;Oral care prior to ice chip/H20 Follow up Recommendations: Home health SLP SLP Visit Diagnosis: Dysphagia, unspecified (R13.10) Plan: Continue with current plan of care       GO                [name] [name], Selinda Orion 10/26/2018, 3:16 PM

## 2018-10-26 NOTE — Progress Notes (Addendum)
   Subjective: Awake and alert. Daughter at bedside. She feels her dad is back to baseline and doing very well. Expresses concern over ability to tolerate po intake. Discussed plan for continued SLP eval and possibility of comfort feeding.   Objective:  Vital signs in last 24 hours: Vitals:   10/25/18 0741 10/25/18 1620 10/25/18 2229 10/26/18 0606  BP: 124/80 121/87 131/83 (!) 138/94  Pulse: (!) 59 63 65 67  Resp: 13 18 19 18   Temp: 97.7 F (36.5 C) 98.3 F (36.8 C) 97.8 F (36.6 C) 97.9 F (36.6 C)  TempSrc: Oral Oral Oral   SpO2: 98% 99% 98% 98%  Weight:      Height:       Gen: resting comfortably, awake and alert  Pulm: CTAB Abd: soft, NT, ND Ext: no LEE, warm Neuro: no facial droop  Assessment/Plan:  Active Problems:   History of pulmonary embolus (PE)   Chronic anticoagulation   HTN (hypertension), benign   Hypernatremia  87yoM with alzheimer's dementia, parkinson's disease, CVA with residual dysphagia presenting with AMS and found to have severe hypernatremia.   Hypernatremia: Improving.  - mental status back to baseline - sodium still elevated, fluids were transitioned to D5 1/2NS overnight. Calculated free water defict is 0.8L. Will change fluids back to D5W at 125cc/hr to also account for extra fluid loses  - repeat BMP tomorrow - f/u SLP eval, discuss possibility of comfort feeding and accepting risk of aspiration. Would discourage placement of feeding tube as it also comes with aspiration risk and does not prolong quality or quantity of life in patient's with severe dementia  [place], Parkinson's, Dysphagia: hold home sinemet, namenda while npo  HTN: normotensive. hold home amlodipine   PE: hold warfarin since NPO. INR currently > 2.0 - daily INR, if INR <2, will start lovenox for dvt ppx if still npo. If tolerating po, can resume home warfarin  - INR 2.22 today   CVA: resume lipitor once he can take po  Dispo: Anticipated discharge in  approximately 1 day(s) pending continued SLP eval and ability to tolerate po    Isabelle Course, MD 10/26/2018, 11:04 AM Pager: [phone]

## 2018-10-27 LAB — URINE CULTURE: Culture: 80000 — AB

## 2018-10-27 LAB — BASIC METABOLIC PANEL
Anion gap: 11 (ref 5–15)
BUN: 12 mg/dL (ref 8–23)
CALCIUM: 8.5 mg/dL — AB (ref 8.9–10.3)
CO2: 22 mmol/L (ref 22–32)
Chloride: 107 mmol/L (ref 98–111)
Creatinine, Ser: 0.79 mg/dL (ref 0.61–1.24)
GFR calc Af Amer: >60 mL/min (ref >60)
Glucose, Bld: 131 mg/dL — ABNORMAL HIGH (ref 70–99)
Potassium: 3.5 mmol/L (ref 3.5–5.1)
SODIUM: 140 mmol/L (ref 135–145)

## 2018-10-27 LAB — PROTIME-INR
INR: 1.85
PROTHROMBIN TIME: 21.1 s — AB (ref 11.4–15.2)

## 2018-10-27 MED ORDER — LORATADINE 10 MG PO TABS
10.0000 mg | ORAL_TABLET | Freq: Every day | ORAL | Status: DC
  Administered 2018-10-28: 10 mg via ORAL
  Filled 2018-10-27 (×2): qty 1

## 2018-10-27 NOTE — Progress Notes (Signed)
   Subjective: Sleeping this morning, less arousable. Daughter is at bedside and expresses concern that he has been coughing all night. Concern for aspiration since he just started eating again yesterday.   Objective:  Vital signs in last 24 hours: Vitals:   10/25/18 1620 10/25/18 2229 10/26/18 0606 10/26/18 1851  BP: 121/87 131/83 (!) 138/94 (!) 131/91  Pulse: 63 65 67 72  Resp: 18 19 18 18   Temp: 98.3 F (36.8 C) 97.8 F (36.6 C) 97.9 F (36.6 C) 97.6 F (36.4 C)  TempSrc: Oral Oral  Oral  SpO2: 99% 98% 98% 98%  Weight:      Height:       Gen: sleeping, does not arouse to name or physical touch, NAD Pulm: poor effort, no crackles, breathing comfortably  Cardiac: RRR  Assessment/Plan:  Active Problems:   History of pulmonary embolus (PE)   Chronic anticoagulation   HTN (hypertension), benign   Hypernatremia  87yoM with alzheimer's dementia, parkinson's disease, CVA with residual dysphagia presenting with AMS and found to have severe hypernatremia.   Hypernatremia 2/2 decreased po intake Dementia, Parkinson's, Dysphagia: Hpyernatremia has resolved, Na 140 this morning. However, he is much more somnolent and less arousable this morning. Daughter states he was up all night coughing. I expect his mental status waxes and wanes as well. Will revaluate him this afternoon. Concern for aspiration given increased coughing. Lung exam is difficult due to poor effort and he does not follow commands. He is comfortable appearing. Afebrile.  - revaluate in the afternoon - consider CXR to evaluate aspiration  - continued SLP eval, discuss possibility of comfort feeding and accepting risk of aspiration. Would discourage placement of feeding tube as it also comes with aspiration risk and does not prolong quality or quantity of life in patient's with severe dementia - home sinemet, namenda   HTN: normotensive. hold home amlodipine   ZO:XWRUE INR, if INR <2, will start lovenox for dvt ppx  if still npo. If tolerating po, can resume home warfarin  - resumed home warfarin yesterday, INR 1.88 today   CVA: continue home lipitor   Dispo: Anticipated discharge in approximately 1 day(s) pending continued SLP eval and ability to tolerate po    Isabelle Course, MD 10/27/2018, 11:22 AM Pager: [phone]

## 2018-10-27 NOTE — Progress Notes (Signed)
Medicine attending: I examined this patient today together with resident physician Dr Francesco Runner and I concur with her evaluation and management plan. Patient is much less interactive today.  Sleeping and does not respond to commands.  Daughter spends the night every night.  She was concerned that he was coughing all night last night. He is afebrile.  Poor cooperation with lung exam but overall clear. White count normal as of February 4. Further improvement in sodium now down to 140. Home caregiver confirms that his mental status does fluctuate with periods of somnolence. We will observe him over the course of the day.  Discharge when back to his baseline.  Chest x-ray if persistent cough.

## 2018-10-27 NOTE — Progress Notes (Signed)
Physical Therapy Treatment Patient Details Name: [name] [name] MRN: [id number] DOB: December 28, 1930 Today's Date: 10/27/2018    History of Present Illness PAtient is an 85 year old male admitted from home with AMS. Found to have hyponatremia, dehydration. PMH to include: CAD, HTN, dementia, parkinson's, CHF, HLD.      PT Comments    Pt required total assistance for all aspects of mobility.  He is unable to follow commands and required total lift from bed to recliner chair.  Pt with stool incontinence during tx.  Plan for 24 hour assistance at d/c remains appropriate.   Follow Up Recommendations  Supervision/Assistance - 24 hour(unsure if patient would benefit from post acute PT as his cognition limits function.  )     Equipment Recommendations       Recommendations for Other Services       Precautions / Restrictions Precautions Precautions: Fall Restrictions Weight Bearing Restrictions: No    Mobility  Bed Mobility Overal bed mobility: Needs Assistance Bed Mobility: Supine to Sit     Supine to sit: Total assist;+2 for physical assistance     General bed mobility comments: Pt required total assistance to move LEs to edge of bed and to elevate trunk into sitting.  Pt with posterior pelvic tilt in sitting and required moderate assistance to maintain sitting x 10 min.   Transfers Overall transfer level: Needs assistance Equipment used: 2 person hand held assist Transfers: Squat Pivot Transfers     Squat pivot transfers: Total assist;+2 physical assistance     General transfer comment: Performed squat pivot from bed to recliner after attempting sit to stand before.  pt only able to clear his bottom enought for tech to clean peri area.   Ambulation/Gait             General Gait Details: unable/unsafe to attempt   Stairs             Wheelchair Mobility    Modified Rankin (Stroke Patients Only)       Balance     Sitting balance-Leahy Scale: Zero        Standing balance-Leahy Scale: Zero                              Cognition Arousal/Alertness: Awake/alert Behavior During Therapy: Flat affect Overall Cognitive Status: Difficult to assess                                 General Comments: Caregiver present and reports he does not follow commands well.        Exercises      General Comments        Pertinent Vitals/Pain Pain Assessment: 0-10 Faces Pain Scale: No hurt    Home Living                      Prior Function            PT Goals (current goals can now be found in the care plan section) Acute Rehab PT Goals Patient Stated Goal: none stated, no family present, assume to return home Potential to Achieve Goals: Fair Progress towards PT goals: Progressing toward goals    Frequency    Min 2X/week      PT Plan Current plan remains appropriate    Co-evaluation  AM-PAC PT "6 Clicks" Mobility   Outcome Measure  Help needed turning from your back to your side while in a flat bed without using bedrails?: Total Help needed moving from lying on your back to sitting on the side of a flat bed without using bedrails?: Total Help needed moving to and from a bed to a chair (including a wheelchair)?: Total Help needed standing up from a chair using your arms (e.g., wheelchair or bedside chair)?: Total Help needed to walk in hospital room?: Total Help needed climbing 3-5 steps with a railing? : Total 6 Click Score: 6    End of Session Equipment Utilized During Treatment: Gait belt Activity Tolerance: Other (comment);Patient limited by lethargy(cognitive impairment) Patient left: with call bell/phone within reach;in chair;with chair alarm set;with nursing/sitter in room Nurse Communication: Mobility status PT Visit Diagnosis: Muscle weakness (generalized) (M62.81);Other abnormalities of gait and mobility (R26.89);History of falling (Z91.81)     Time:  2449-7530 PT Time Calculation (min) (ACUTE ONLY): 20 min  Charges:  $Therapeutic Activity: 8-22 mins                     [name] [name], PTA Acute Rehabilitation Services Pager [phone] Office [phone]     [name] [name] [name] [name] 10/27/2018, 4:57 PM

## 2018-10-27 NOTE — Plan of Care (Signed)
  Problem: Clinical Measurements: Goal: Ability to maintain clinical measurements within normal limits will improve Outcome: Progressing   

## 2018-10-27 NOTE — Progress Notes (Signed)
Pt asleep for most of the day and unable to take any PO meds throughout shift. Pts daughter at bedside and requested that meds be rescheduled until 2200.

## 2018-10-28 LAB — GLUCOSE, CAPILLARY: Glucose-Capillary: 78 mg/dL (ref 70–99)

## 2018-10-28 LAB — BASIC METABOLIC PANEL
Anion gap: 9 (ref 5–15)
BUN: 14 mg/dL (ref 8–23)
CO2: 25 mmol/L (ref 22–32)
Calcium: 8.4 mg/dL — ABNORMAL LOW (ref 8.9–10.3)
Chloride: 109 mmol/L (ref 98–111)
Creatinine, Ser: 0.89 mg/dL (ref 0.61–1.24)
GFR calc Af Amer: >60 mL/min (ref >60)
GFR calc non Af Amer: >60 mL/min (ref >60)
Glucose, Bld: 76 mg/dL (ref 70–99)
Potassium: 3.3 mmol/L — ABNORMAL LOW (ref 3.5–5.1)
Sodium: 143 mmol/L (ref 135–145)

## 2018-10-28 LAB — PROTIME-INR
INR: 1.75
Prothrombin Time: 20.2 seconds — ABNORMAL HIGH (ref 11.4–15.2)

## 2018-10-28 MED ORDER — ENOXAPARIN SODIUM 100 MG/ML ~~LOC~~ SOLN: 1.5000 mg/kg | SUBCUTANEOUS | 0 refills | Status: DC

## 2018-10-28 MED ORDER — ENOXAPARIN SODIUM 100 MG/ML ~~LOC~~ SOLN
1.5000 mg/kg | SUBCUTANEOUS | Status: DC
  Administered 2018-10-28: 90 mg via SUBCUTANEOUS
  Filled 2018-10-28: qty 1

## 2018-10-28 MED ORDER — POTASSIUM CHLORIDE CRYS ER 20 MEQ PO TBCR
40.0000 meq | EXTENDED_RELEASE_TABLET | Freq: Once | ORAL | Status: AC
  Administered 2018-10-28: 40 meq via ORAL

## 2018-10-28 MED ORDER — POTASSIUM CHLORIDE 10 MEQ/100ML IV SOLN
10.0000 meq | INTRAVENOUS | Status: AC
  Administered 2018-10-28: 10 meq via INTRAVENOUS
  Filled 2018-10-28: qty 100

## 2018-10-28 NOTE — Progress Notes (Signed)
Patient discharged pending completion of his potassium supplementation.  Pearson Grippe, DO IM PGY-2

## 2018-10-28 NOTE — Progress Notes (Signed)
Pt/caregiver given discharge instructions, prescriptions, and care notes. Pt/caregiver verbalized understanding AEB no further questions or concerns at this time. IV was discontinued, no redness, pain, or swelling noted at this time. Skin dry and intact. Pt left the floor with family/caregiver in stable condition.

## 2018-10-28 NOTE — Progress Notes (Signed)
Pt took 60mEq oral Potassium.

## 2018-10-28 NOTE — Progress Notes (Addendum)
   Subjective: Patient remains somnolent this AM. He responds to painful stimuli and responded to voice briefly answer with 1 word answer. States he feels okay.  Spoke with daughter at bedside who feels he is close to his baseline and may do better with more rest and back in his home environment. She is comfortable with discharge home today and will follow up in the [facility] clinic, with plans to make early appointment if he does not improve over the weekend. She requests prescription for Lovenox for his anticoagulation given his low INR and intermittent ability to take his Warfarin.  Objective:  Vital signs in last 24 hours: Vitals:   10/26/18 1851 10/27/18 1151 10/27/18 1356 10/28/18 0639  BP: (!) 131/91 99/72 111/70 133/83  Pulse: 72 72 72 (!) 57  Resp: 18 18 16 16   Temp: 97.6 F (36.4 C) 97.9 F (36.6 C) 97.7 F (36.5 C)   TempSrc: Oral Axillary Axillary   SpO2: 98% 93% 95% 99%  Weight:      Height:       Gen: sleeping, does not arousable to pain and briefly to voce, NAD Pulm: decreaswed effort, no crackles, breathing comfortably  Cardiac: RRR no r/m/g/ Extremities: No edema or deformity  Assessment/Plan:  Active Problems:   History of pulmonary embolus (PE)   Chronic anticoagulation   HTN (hypertension), benign   Hypernatremia  87yoM with alzheimer's dementia, parkinson's disease, CVA with residual dysphagia presenting with AMS and found to have severe hypernatremia.   Hypernatremia 2/2 decreased po intake: Resolved Dementia, Parkinson's, Dysphagia: Remains more somnolent, but did rouse to pani and briefly to voice today. Daughter report no further coughing overnight and that she is hoping to get him home today. She will monitor him at home for improvement, given the waxing and waning nature of his mental status. She will bring him in to clinic for follow up and bring him early next week if not improving over the weekend. He is comfortable appearing. Afebrile.  - SLP eval,:  Dys 1 when alert. Will follow up with PCP for consideration of tube feeds. Would discourage placement of feeding tube as it also comes with aspiration risk and does not prolong quality or quantity of life in patient's with severe dementia - home sinemet, namenda   HTN: normotensive. hold home amlodipine   PE: Daily INR, INR <2 and patient inconsistently alert for taking oral medications.  - Will start lovenox treatment dose - If tolerating po, can resume home warfarin    CVA: continue home lipitor   Hypokalemia: K 3.3 this AM, will replete IV prior to discharge  Dispo: Anticipated discharge later today. Patient to follow up in clinic.    Neva Seat, MD 10/28/2018, 10:02 AM

## 2018-10-29 DIAGNOSIS — R531 Weakness: Secondary | ICD-10-CM | POA: Diagnosis not present

## 2018-10-29 DIAGNOSIS — F0280 Dementia in other diseases classified elsewhere without behavioral disturbance: Secondary | ICD-10-CM | POA: Diagnosis not present

## 2018-10-29 DIAGNOSIS — I509 Heart failure, unspecified: Secondary | ICD-10-CM | POA: Diagnosis not present

## 2018-10-29 DIAGNOSIS — G20 Parkinson's disease: Secondary | ICD-10-CM | POA: Diagnosis not present

## 2018-10-29 DIAGNOSIS — I110 Hypertensive heart disease with heart failure: Secondary | ICD-10-CM | POA: Diagnosis not present

## 2018-10-29 DIAGNOSIS — R001 Bradycardia, unspecified: Secondary | ICD-10-CM | POA: Diagnosis not present

## 2018-10-30 ENCOUNTER — Ambulatory Visit (INDEPENDENT_AMBULATORY_CARE_PROVIDER_SITE_OTHER): Admitting: Pharmacist

## 2018-10-30 ENCOUNTER — Encounter: Payer: Self-pay | Admitting: Pharmacist

## 2018-10-30 DIAGNOSIS — Z86711 Personal history of pulmonary embolism: Secondary | ICD-10-CM | POA: Diagnosis not present

## 2018-10-30 DIAGNOSIS — Z7902 Long term (current) use of antithrombotics/antiplatelets: Secondary | ICD-10-CM

## 2018-10-30 LAB — POCT INR: INR: 1.8 — AB (ref 2.0–3.0)

## 2018-10-30 NOTE — Patient Instructions (Signed)
Patient's daughter instructed to give medications as defined in the Anti-coagulation Track section of this encounter.  Patient's daughter instructed to give today's dose.  Patient's daughter instructed to give one-half (1/2) of  his 5mg  peach-colored warfarin Tuesdays, Thursdays and Saturdays. All other days, Sundays, Mondays, Wednesdays and Fridays--give one (1) tablet by mouth each day at [facility]. Continue the low-molecular-weight-heparin syringes you have, as directed upon the discharge instructions from his most recent hospitalization.  Patient's daughter verbalized understanding of these instructions.

## 2018-10-30 NOTE — Progress Notes (Signed)
Anticoagulation Management [name] [name] is a 85 y.o. male who reports to the clinic for monitoring of warfarin treatment.    Indication: PE, History of; Long-term (current) use of anticoagulants.   Duration: indefinite Supervising physician: Joni Reining  Anticoagulation Clinic Visit History: Patient does not report signs/symptoms of bleeding or thromboembolism  Other recent changes: Yes to diet, medications, lifestyle changes (see patient findings).  Anticoagulation Episode Summary    Current INR goal:   2.0-3.0  TTR:   73.7 % (7.7 y)  Next INR check:   11/20/2018  INR from last check:   1.8! (10/30/2018)  Weekly max warfarin dose:     Target end date:   Indefinite  INR check location:   Anticoagulation Clinic  Preferred lab:     Send INR reminders to:   ANTICOAG [facility]   Indications   History of pulmonary embolus (PE) [Z86.711] Encounter for long-term use of antiplatelets/antithrombotics [Z79.02]       Comments:   History of multiple venous embolic episodes. Will continue to annual re-evaluate continued need for warfarin weighing risks vs. benefits.       Anticoagulation Care Providers    Provider Role Specialty Phone number   Bertha Stakes, MD  Internal Medicine [phone]      Allergies  Allergen Reactions  . Aricept [Donepezil Hcl] Other (See Comments)    Symptomatic Bradycardia.  . Shellfish Allergy Other (See Comments)    Causes gout Causes a flare up with Gout   Prior to Admission medications   Medication Sig Start Date End Date Taking? Authorizing Provider  acetaminophen (TYLENOL) 500 MG tablet Take 1,000 mg by mouth 2 (two) times daily.    Yes [provider]  atorvastatin (LIPITOR) 10 MG tablet Take 1 tablet (10 mg total) by mouth daily at 6 PM. 06/19/18  Yes Hoffman, Rachel Moulds, DO  Ca Phosphate-Cholecalciferol (CALCIUM/VITAMIN D3 GUMMIES) 250-350 MG-UNIT CHEW Chew 1 Dose by mouth 2 (two) times daily. 07/08/16  Yes [name], DO   carbidopa-levodopa (SINEMET IR) 25-100 MG tablet Take 1 tablet by mouth 4 (four) times daily. 05/02/18  Yes Garvin Fila, MD  enoxaparin (LOVENOX) 100 MG/ML injection Inject 0.9 mLs (90 mg total) into the skin daily. 10/28/18  Yes Neva Seat, MD  loratadine (CLARITIN) 10 MG tablet Take 10 mg by mouth daily.   Yes [provider]  memantine (NAMENDA XR) 28 MG CP24 24 hr capsule TAKE 1 CAPSULE BY MOUTH EVERY DAY IN THE MORNING Patient taking differently: Take 28 mg by mouth daily.  08/09/18  Yes Garvin Fila, MD  Multiple Vitamins-Minerals (MULTIVITAMIN WITH MINERALS) tablet Take 1 tablet by mouth daily.    Yes [provider]  polyethylene glycol (MIRALAX / GLYCOLAX) packet Take 17 g by mouth daily as needed (constipation).   Yes [provider]  potassium chloride SA (KLOR-CON M20) 20 MEQ tablet Take 2 tablets (40 mEq total) by mouth daily. 06/27/18  Yes [name], DO  senna (SENOKOT) 8.6 MG TABS tablet Take 1 tablet (8.6 mg total) by mouth daily as needed for mild constipation. 07/13/16  Yes Velna Ochs, MD  STARCH-MALTO DEXTRIN ([employer] INSTANT FOOD THICKENER) POWD Take 1 Scoop by mouth daily as needed. Patient taking differently: Take 1 Scoop by mouth daily as needed (food thickner).  09/15/18  Yes Ledell Noss, MD  warfarin (COUMADIN) 5 MG tablet TAKE 1/2 TABLET BY MOUTH EVERY TUE, THU, SAT AND 1 TABLET BY MOUTH ALL OTHER DAYS Patient taking differently: Take  2.5-5 mg by mouth See admin instructions. Take 2.5 mg everyday except Wednesday and Friday 5 mg. 06/16/18  Yes [name], DO   Past Medical History:  Diagnosis Date  . Anxiety   . Coronary artery disease, non-occlusive    with history of MI; Cath 2008 w multivessel nonobstructive CAD  . Dementia ([place])   . Depression   . GERD (gastroesophageal reflux disease)   . Hyperlipidemia   . Hypertension   . Memory loss   . Parkinson disease ([place])   . PE (pulmonary embolism)    unprovoked PE  completed 6 months of warfarin, warfarin d/ced 02/12/2010, repeat PE 07/10/2010 after a long car ride and now on lifelong coumadin.  . Pituitary microadenoma ([place])    incidental finding CT 12/2009  . Prostate cancer ([place])   . Scoliosis   . Sigmoid volvulus ([place]) 08/02/2016  . Stroke ([place]) 03/2018  . Volvulus of colon ([place]) 06/01/2017   Social History   Socioeconomic History  . Marital status: Married    Spouse name: Not on file  . Number of children: 4  . Years of education: master's  . Highest education level: Not on file  Occupational History  . Occupation: Retired    Fish farm manager: RETIRED  Social Needs  . Financial resource strain: Not on file  . Food insecurity:    Worry: Not on file    Inability: Not on file  . Transportation needs:    Medical: Not on file    Non-medical: Not on file  Tobacco Use  . Smoking status: Never Smoker  . Smokeless tobacco: Never Used  Substance and Sexual Activity  . Alcohol use: No    Alcohol/week: 0.0 standard drinks  . Drug use: No  . Sexual activity: Never  Lifestyle  . Physical activity:    Days per week: Not on file    Minutes per session: Not on file  . Stress: Not on file  Relationships  . Social connections:    Talks on phone: Not on file    Gets together: Not on file    Attends religious service: Not on file    Active member of club or organization: Not on file    Attends meetings of clubs or organizations: Not on file    Relationship status: Not on file  Other Topics Concern  . Not on file  Social History Narrative   Lives with daughter. Wife also has severe dementia.    Drinks 1 cup of coffee a day    Family History  Problem Relation Age of Onset  . Hypertension Father        Passed away from cerebral hemorrhage at age of 33.  [name] Dementia Mother   . Hypertension Child        4 adult children  . Cervical cancer Other   . Lung cancer Other   . Stroke Other   . Heart attack Neg Hx     ASSESSMENT Recent  Results: The most recent result is correlated with 22.5 mg per week: Lab Results  Component Value Date   INR 1.8 (A) 10/30/2018   INR 1.75 10/28/2018   INR 1.85 10/27/2018    Anticoagulation Dosing: Description   Give one-half (1/2) of  his 5mg  peach-colored warfarin Tuesdays, Thursdays and Saturdays. All other days, Sundays, Mondays, Wednesdays and Fridays--give one (1) tablet by mouth each day at [facility]. Continue the low-molecular-weight-heparin syringes you have, as directed upon the discharge instructions from his most recent hospitalization.  INR today: Subtherapeutic  PLAN Weekly dose was increased by 20% to 27.5 mg per week. CONTINUE LMWH enoxaparin bridge therapy based upon discharge instructions from hospital.   Patient Instructions  Patient's daughter instructed to give medications as defined in the Anti-coagulation Track section of this encounter.  Patient's daughter instructed to give today's dose.  Patient's daughter instructed to give one-half (1/2) of  his 5mg  peach-colored warfarin Tuesdays, Thursdays and Saturdays. All other days, Sundays, Mondays, Wednesdays and Fridays--give one (1) tablet by mouth each day at [facility]. Continue the low-molecular-weight-heparin syringes you have, as directed upon the discharge instructions from his most recent hospitalization.  Patient's daughter verbalized understanding of these instructions.     Patient advised to contact clinic or seek medical attention if signs/symptoms of bleeding or thromboembolism occur.  Patient verbalized understanding by repeating back information and was advised to contact me if further medication-related questions arise. Patient was also provided an information handout.  Follow-up Return in 3 weeks (on 11/20/2018) for Follow up INR at [place], PharmD, CPP  15 minutes spent face-to-face with the patient during the encounter. 50% of time spent on education. 50% of time was spent on fingerstick  point of care INR sample collection, processing, results determination, dose adjustment and documentation in http://www.kim.net/.

## 2018-10-31 DIAGNOSIS — F0280 Dementia in other diseases classified elsewhere without behavioral disturbance: Secondary | ICD-10-CM | POA: Diagnosis not present

## 2018-10-31 DIAGNOSIS — I110 Hypertensive heart disease with heart failure: Secondary | ICD-10-CM | POA: Diagnosis not present

## 2018-10-31 DIAGNOSIS — G20 Parkinson's disease: Secondary | ICD-10-CM | POA: Diagnosis not present

## 2018-10-31 DIAGNOSIS — R001 Bradycardia, unspecified: Secondary | ICD-10-CM | POA: Diagnosis not present

## 2018-10-31 DIAGNOSIS — R531 Weakness: Secondary | ICD-10-CM | POA: Diagnosis not present

## 2018-10-31 DIAGNOSIS — I509 Heart failure, unspecified: Secondary | ICD-10-CM | POA: Diagnosis not present

## 2018-11-01 DIAGNOSIS — F0280 Dementia in other diseases classified elsewhere without behavioral disturbance: Secondary | ICD-10-CM | POA: Diagnosis not present

## 2018-11-01 DIAGNOSIS — R531 Weakness: Secondary | ICD-10-CM | POA: Diagnosis not present

## 2018-11-01 DIAGNOSIS — R001 Bradycardia, unspecified: Secondary | ICD-10-CM | POA: Diagnosis not present

## 2018-11-01 DIAGNOSIS — I509 Heart failure, unspecified: Secondary | ICD-10-CM | POA: Diagnosis not present

## 2018-11-01 DIAGNOSIS — G20 Parkinson's disease: Secondary | ICD-10-CM | POA: Diagnosis not present

## 2018-11-01 DIAGNOSIS — I110 Hypertensive heart disease with heart failure: Secondary | ICD-10-CM | POA: Diagnosis not present

## 2018-11-02 DIAGNOSIS — R531 Weakness: Secondary | ICD-10-CM | POA: Diagnosis not present

## 2018-11-02 DIAGNOSIS — G20 Parkinson's disease: Secondary | ICD-10-CM | POA: Diagnosis not present

## 2018-11-02 DIAGNOSIS — F0280 Dementia in other diseases classified elsewhere without behavioral disturbance: Secondary | ICD-10-CM | POA: Diagnosis not present

## 2018-11-02 DIAGNOSIS — I509 Heart failure, unspecified: Secondary | ICD-10-CM | POA: Diagnosis not present

## 2018-11-02 DIAGNOSIS — I110 Hypertensive heart disease with heart failure: Secondary | ICD-10-CM | POA: Diagnosis not present

## 2018-11-02 DIAGNOSIS — R001 Bradycardia, unspecified: Secondary | ICD-10-CM | POA: Diagnosis not present

## 2018-11-03 DIAGNOSIS — G20 Parkinson's disease: Secondary | ICD-10-CM | POA: Diagnosis not present

## 2018-11-03 DIAGNOSIS — R531 Weakness: Secondary | ICD-10-CM | POA: Diagnosis not present

## 2018-11-03 DIAGNOSIS — R001 Bradycardia, unspecified: Secondary | ICD-10-CM | POA: Diagnosis not present

## 2018-11-03 DIAGNOSIS — F0280 Dementia in other diseases classified elsewhere without behavioral disturbance: Secondary | ICD-10-CM | POA: Diagnosis not present

## 2018-11-03 DIAGNOSIS — I509 Heart failure, unspecified: Secondary | ICD-10-CM | POA: Diagnosis not present

## 2018-11-03 DIAGNOSIS — I110 Hypertensive heart disease with heart failure: Secondary | ICD-10-CM | POA: Diagnosis not present

## 2018-11-07 ENCOUNTER — Encounter: Payer: Self-pay | Admitting: Internal Medicine

## 2018-11-07 DIAGNOSIS — G20 Parkinson's disease: Secondary | ICD-10-CM | POA: Diagnosis not present

## 2018-11-07 DIAGNOSIS — R531 Weakness: Secondary | ICD-10-CM | POA: Diagnosis not present

## 2018-11-07 DIAGNOSIS — F0280 Dementia in other diseases classified elsewhere without behavioral disturbance: Secondary | ICD-10-CM | POA: Diagnosis not present

## 2018-11-07 DIAGNOSIS — I509 Heart failure, unspecified: Secondary | ICD-10-CM | POA: Diagnosis not present

## 2018-11-07 DIAGNOSIS — I110 Hypertensive heart disease with heart failure: Secondary | ICD-10-CM | POA: Diagnosis not present

## 2018-11-07 DIAGNOSIS — R001 Bradycardia, unspecified: Secondary | ICD-10-CM | POA: Diagnosis not present

## 2018-11-07 NOTE — Progress Notes (Deleted)
Reviewed last note from hospice, amlodipine has been discontinued due to some hypotension.

## 2018-11-08 DIAGNOSIS — G20 Parkinson's disease: Secondary | ICD-10-CM | POA: Diagnosis not present

## 2018-11-08 DIAGNOSIS — F0280 Dementia in other diseases classified elsewhere without behavioral disturbance: Secondary | ICD-10-CM | POA: Diagnosis not present

## 2018-11-08 DIAGNOSIS — I110 Hypertensive heart disease with heart failure: Secondary | ICD-10-CM | POA: Diagnosis not present

## 2018-11-08 DIAGNOSIS — R001 Bradycardia, unspecified: Secondary | ICD-10-CM | POA: Diagnosis not present

## 2018-11-08 DIAGNOSIS — I509 Heart failure, unspecified: Secondary | ICD-10-CM | POA: Diagnosis not present

## 2018-11-08 DIAGNOSIS — R531 Weakness: Secondary | ICD-10-CM | POA: Diagnosis not present

## 2018-11-09 DIAGNOSIS — I509 Heart failure, unspecified: Secondary | ICD-10-CM | POA: Diagnosis not present

## 2018-11-09 DIAGNOSIS — G20 Parkinson's disease: Secondary | ICD-10-CM | POA: Diagnosis not present

## 2018-11-09 DIAGNOSIS — I110 Hypertensive heart disease with heart failure: Secondary | ICD-10-CM | POA: Diagnosis not present

## 2018-11-09 DIAGNOSIS — F0280 Dementia in other diseases classified elsewhere without behavioral disturbance: Secondary | ICD-10-CM | POA: Diagnosis not present

## 2018-11-09 DIAGNOSIS — R531 Weakness: Secondary | ICD-10-CM | POA: Diagnosis not present

## 2018-11-09 DIAGNOSIS — R001 Bradycardia, unspecified: Secondary | ICD-10-CM | POA: Diagnosis not present

## 2018-11-10 DIAGNOSIS — F0280 Dementia in other diseases classified elsewhere without behavioral disturbance: Secondary | ICD-10-CM | POA: Diagnosis not present

## 2018-11-10 DIAGNOSIS — R531 Weakness: Secondary | ICD-10-CM | POA: Diagnosis not present

## 2018-11-10 DIAGNOSIS — I509 Heart failure, unspecified: Secondary | ICD-10-CM | POA: Diagnosis not present

## 2018-11-10 DIAGNOSIS — I110 Hypertensive heart disease with heart failure: Secondary | ICD-10-CM | POA: Diagnosis not present

## 2018-11-10 DIAGNOSIS — G20 Parkinson's disease: Secondary | ICD-10-CM | POA: Diagnosis not present

## 2018-11-10 DIAGNOSIS — R001 Bradycardia, unspecified: Secondary | ICD-10-CM | POA: Diagnosis not present

## 2018-11-12 DIAGNOSIS — R05 Cough: Secondary | ICD-10-CM | POA: Diagnosis not present

## 2018-11-12 DIAGNOSIS — G20 Parkinson's disease: Secondary | ICD-10-CM | POA: Diagnosis not present

## 2018-11-12 DIAGNOSIS — I252 Old myocardial infarction: Secondary | ICD-10-CM | POA: Diagnosis not present

## 2018-11-12 DIAGNOSIS — I10 Essential (primary) hypertension: Secondary | ICD-10-CM | POA: Diagnosis not present

## 2018-11-12 DIAGNOSIS — R404 Transient alteration of awareness: Secondary | ICD-10-CM | POA: Diagnosis not present

## 2018-11-12 DIAGNOSIS — R531 Weakness: Secondary | ICD-10-CM | POA: Diagnosis not present

## 2018-11-12 DIAGNOSIS — R062 Wheezing: Secondary | ICD-10-CM | POA: Diagnosis not present

## 2018-11-12 DIAGNOSIS — T17908A Unspecified foreign body in respiratory tract, part unspecified causing other injury, initial encounter: Secondary | ICD-10-CM | POA: Diagnosis not present

## 2018-11-12 DIAGNOSIS — Z79899 Other long term (current) drug therapy: Secondary | ICD-10-CM | POA: Diagnosis not present

## 2018-11-12 DIAGNOSIS — F0280 Dementia in other diseases classified elsewhere without behavioral disturbance: Secondary | ICD-10-CM | POA: Diagnosis not present

## 2018-11-12 DIAGNOSIS — Z8546 Personal history of malignant neoplasm of prostate: Secondary | ICD-10-CM | POA: Diagnosis not present

## 2018-11-13 DIAGNOSIS — R001 Bradycardia, unspecified: Secondary | ICD-10-CM | POA: Diagnosis not present

## 2018-11-13 DIAGNOSIS — F0280 Dementia in other diseases classified elsewhere without behavioral disturbance: Secondary | ICD-10-CM | POA: Diagnosis not present

## 2018-11-13 DIAGNOSIS — I509 Heart failure, unspecified: Secondary | ICD-10-CM | POA: Diagnosis not present

## 2018-11-13 DIAGNOSIS — G20 Parkinson's disease: Secondary | ICD-10-CM | POA: Diagnosis not present

## 2018-11-13 DIAGNOSIS — R531 Weakness: Secondary | ICD-10-CM | POA: Diagnosis not present

## 2018-11-13 DIAGNOSIS — I110 Hypertensive heart disease with heart failure: Secondary | ICD-10-CM | POA: Diagnosis not present

## 2018-11-14 ENCOUNTER — Ambulatory Visit: Payer: Self-pay | Admitting: Adult Health

## 2018-11-14 DIAGNOSIS — R001 Bradycardia, unspecified: Secondary | ICD-10-CM | POA: Diagnosis not present

## 2018-11-14 DIAGNOSIS — I509 Heart failure, unspecified: Secondary | ICD-10-CM | POA: Diagnosis not present

## 2018-11-14 DIAGNOSIS — G20 Parkinson's disease: Secondary | ICD-10-CM | POA: Diagnosis not present

## 2018-11-14 DIAGNOSIS — I110 Hypertensive heart disease with heart failure: Secondary | ICD-10-CM | POA: Diagnosis not present

## 2018-11-14 DIAGNOSIS — R531 Weakness: Secondary | ICD-10-CM | POA: Diagnosis not present

## 2018-11-14 DIAGNOSIS — F0280 Dementia in other diseases classified elsewhere without behavioral disturbance: Secondary | ICD-10-CM | POA: Diagnosis not present

## 2018-11-15 DIAGNOSIS — I509 Heart failure, unspecified: Secondary | ICD-10-CM | POA: Diagnosis not present

## 2018-11-15 DIAGNOSIS — F0280 Dementia in other diseases classified elsewhere without behavioral disturbance: Secondary | ICD-10-CM | POA: Diagnosis not present

## 2018-11-15 DIAGNOSIS — G20 Parkinson's disease: Secondary | ICD-10-CM | POA: Diagnosis not present

## 2018-11-15 DIAGNOSIS — I110 Hypertensive heart disease with heart failure: Secondary | ICD-10-CM | POA: Diagnosis not present

## 2018-11-15 DIAGNOSIS — R001 Bradycardia, unspecified: Secondary | ICD-10-CM | POA: Diagnosis not present

## 2018-11-15 DIAGNOSIS — R531 Weakness: Secondary | ICD-10-CM | POA: Diagnosis not present

## 2018-11-16 ENCOUNTER — Telehealth: Payer: Self-pay | Admitting: Adult Health

## 2018-11-16 DIAGNOSIS — I509 Heart failure, unspecified: Secondary | ICD-10-CM | POA: Diagnosis not present

## 2018-11-16 DIAGNOSIS — R531 Weakness: Secondary | ICD-10-CM | POA: Diagnosis not present

## 2018-11-16 DIAGNOSIS — G20 Parkinson's disease: Secondary | ICD-10-CM | POA: Diagnosis not present

## 2018-11-16 DIAGNOSIS — R001 Bradycardia, unspecified: Secondary | ICD-10-CM | POA: Diagnosis not present

## 2018-11-16 DIAGNOSIS — F0280 Dementia in other diseases classified elsewhere without behavioral disturbance: Secondary | ICD-10-CM | POA: Diagnosis not present

## 2018-11-16 DIAGNOSIS — I110 Hypertensive heart disease with heart failure: Secondary | ICD-10-CM | POA: Diagnosis not present

## 2018-11-16 NOTE — Telephone Encounter (Signed)
I called the patients daughter back about the price of namenda. The daughter stated the current namenda 28mg  is  95.00 per month. because of the tier changes.They wanted to know would her father benefit from taking the higher dosage as suggested by Janett Billow NP. Also she stated upon her reading this current medication at 28mg  has better results than the lower dosage of 10mg . Pt has been on the namenda since 2018. [name] wants he father to keep what memory he has and is willing to pay if. She wanted to know about the namenda 10mg  if that would make him decline. I stated a message will be sent to [facility] NP. She verbalized understanding.

## 2018-11-16 NOTE — Telephone Encounter (Signed)
[name]/daughter /DPR said namenda has been moved to another tier and will cost $100/mth. She is wanting to know if there is something else he could take. Please call to advise

## 2018-11-16 NOTE — Telephone Encounter (Signed)
I spoke with Janett Billow NP about daughters concerns over the price of namenda XR being 95.00 dollars. ALso the benefits of continuing to take it or changing to old dosage of 10 mg daily. Janett Billow NP stated she cant state his memory will not get worse or better on old dosage. She recommend we can discontinue the namenda 28 due to price, and prescribed the namenda 10mg . IF pts memory starts declining on the namenda 10mg  we can consider the namenda 28mg  if she is willing to pay the price.

## 2018-11-17 DIAGNOSIS — R001 Bradycardia, unspecified: Secondary | ICD-10-CM | POA: Diagnosis not present

## 2018-11-17 DIAGNOSIS — G20 Parkinson's disease: Secondary | ICD-10-CM | POA: Diagnosis not present

## 2018-11-17 DIAGNOSIS — I110 Hypertensive heart disease with heart failure: Secondary | ICD-10-CM | POA: Diagnosis not present

## 2018-11-17 DIAGNOSIS — R531 Weakness: Secondary | ICD-10-CM | POA: Diagnosis not present

## 2018-11-17 DIAGNOSIS — I509 Heart failure, unspecified: Secondary | ICD-10-CM | POA: Diagnosis not present

## 2018-11-17 DIAGNOSIS — F0280 Dementia in other diseases classified elsewhere without behavioral disturbance: Secondary | ICD-10-CM | POA: Diagnosis not present

## 2018-11-19 DIAGNOSIS — R269 Unspecified abnormalities of gait and mobility: Secondary | ICD-10-CM | POA: Diagnosis not present

## 2018-11-19 DIAGNOSIS — M1A00X0 Idiopathic chronic gout, unspecified site, without tophus (tophi): Secondary | ICD-10-CM | POA: Diagnosis not present

## 2018-11-19 DIAGNOSIS — I509 Heart failure, unspecified: Secondary | ICD-10-CM | POA: Diagnosis not present

## 2018-11-19 DIAGNOSIS — R32 Unspecified urinary incontinence: Secondary | ICD-10-CM | POA: Diagnosis not present

## 2018-11-19 DIAGNOSIS — F0280 Dementia in other diseases classified elsewhere without behavioral disturbance: Secondary | ICD-10-CM | POA: Diagnosis not present

## 2018-11-19 DIAGNOSIS — G20 Parkinson's disease: Secondary | ICD-10-CM | POA: Diagnosis not present

## 2018-11-19 DIAGNOSIS — Z515 Encounter for palliative care: Secondary | ICD-10-CM | POA: Diagnosis not present

## 2018-11-19 DIAGNOSIS — R159 Full incontinence of feces: Secondary | ICD-10-CM | POA: Diagnosis not present

## 2018-11-19 DIAGNOSIS — R001 Bradycardia, unspecified: Secondary | ICD-10-CM | POA: Diagnosis not present

## 2018-11-19 DIAGNOSIS — I110 Hypertensive heart disease with heart failure: Secondary | ICD-10-CM | POA: Diagnosis not present

## 2018-11-19 DIAGNOSIS — R531 Weakness: Secondary | ICD-10-CM | POA: Diagnosis not present

## 2018-11-20 ENCOUNTER — Ambulatory Visit (INDEPENDENT_AMBULATORY_CARE_PROVIDER_SITE_OTHER): Admitting: Pharmacist

## 2018-11-20 ENCOUNTER — Encounter: Payer: Self-pay | Admitting: Podiatry

## 2018-11-20 ENCOUNTER — Encounter: Payer: Self-pay | Admitting: Pharmacist

## 2018-11-20 ENCOUNTER — Ambulatory Visit: Payer: Medicare HMO | Admitting: Podiatry

## 2018-11-20 DIAGNOSIS — Z7902 Long term (current) use of antithrombotics/antiplatelets: Secondary | ICD-10-CM | POA: Diagnosis not present

## 2018-11-20 DIAGNOSIS — M79676 Pain in unspecified toe(s): Secondary | ICD-10-CM | POA: Diagnosis not present

## 2018-11-20 DIAGNOSIS — B351 Tinea unguium: Secondary | ICD-10-CM | POA: Diagnosis not present

## 2018-11-20 DIAGNOSIS — D689 Coagulation defect, unspecified: Secondary | ICD-10-CM | POA: Diagnosis not present

## 2018-11-20 DIAGNOSIS — Z86711 Personal history of pulmonary embolism: Secondary | ICD-10-CM | POA: Diagnosis not present

## 2018-11-20 DIAGNOSIS — Z7901 Long term (current) use of anticoagulants: Secondary | ICD-10-CM

## 2018-11-20 DIAGNOSIS — Z5181 Encounter for therapeutic drug level monitoring: Secondary | ICD-10-CM

## 2018-11-20 LAB — POCT INR: INR: 2.7 (ref 2.0–3.0)

## 2018-11-20 NOTE — Patient Instructions (Signed)
Patient's daughter instructed to give medications as defined in the Anti-coagulation Track section of this encounter.  Patient's daughter instructed to give today's dose.  Patient's daughter instructed to give one-half (1/2) of  his 5mg  peach-colored warfarin on Mondays, Tuesdays, Thursdays and Saturdays. All other days, Sundays, Wednesdays and Fridays--give one (1) tablet by mouth each day at [facility].  Patient's daughter verbalized understanding of these instructions.

## 2018-11-20 NOTE — Telephone Encounter (Signed)
I called pts daughter [name] about changing the namenda to 10mg  twice a day because the namenda 28 has change tiers. The price for namenda has increase to 95.00 for 30 day supply. I explained per Janett Billow NP if they are unable to afford we can change back to 10mg  bid. IF the memory is declining we can change back to namenda 28mg  if she is willing to pay the price. The daughter stated her father has been on 28mg  dosage since 2017 and he is talking more, and its too risky for him to be on a different dosage. She does not want his memory to get worse than what it is. [name] stated she will continue to pay the 95.00 dollars because its worth it.  PT will stay on namenda 28mg .

## 2018-11-20 NOTE — Progress Notes (Signed)
Patient ID: [name] [name], male   DOB: 1931-01-22, 85 y.o.   MRN: [id number] Complaint:  Visit Type: Patient returns to my office for continued preventative foot care services. Complaint: Patient states" my nails have grown long and thick and become painful to walk and wear shoes" . The patient presents for preventative foot care services. No changes to ROS.  Patient has not been seen in over 10  months.    Podiatric Exam: Vascular: dorsalis pedis and posterior tibial pulses are palpable bilateral. Capillary return is immediate. Temperature gradient is WNL. Skin turgor WNL  Sensorium: Normal Semmes Weinstein monofilament test. Normal tactile sensation bilaterally. Nail Exam: Pt has thick disfigured discolored nails with subungual debris noted bilateral entire nail hallux . Ulcer Exam: There is no evidence of ulcer or pre-ulcerative changes or infection. Orthopedic Exam: Muscle tone and strength are WNL. No limitations in general ROM. No crepitus or effusions noted. Foot type and digits show no abnormalities.  Contracture right hallux.  Skin: No Porokeratosis. No infection or ulcers  Diagnosis:  Onychomycosis, , Pain in right toe, pain in left toes  Treatment & Plan Procedures and Treatment: Consent by patient was obtained for treatment procedures. The patient understood the discussion of treatment and procedures well. All questions were answered thoroughly reviewed. Debridement of mycotic and hypertrophic toenails, 1 through 5 bilateral and clearing of subungual debris. No ulceration, no infection noted.  Return Visit-Office Procedure: Patient instructed to return to the office for a follow up visit prn   for continued evaluation and treatment.   Gardiner Barefoot DPM    Gardiner Barefoot DPM

## 2018-11-20 NOTE — Progress Notes (Signed)
INTERNAL MEDICINE TEACHING ATTENDING ADDENDUM - Aldine Contes M.D  Duration- indefinite, Indication- recurrent PE, INR- therapeutic. Agree with pharmacy recommendations as outlined in their note.

## 2018-11-20 NOTE — Progress Notes (Signed)
Anticoagulation Management [name] [name] is a 85 y.o. male who reports to the clinic for monitoring of warfarin treatment.    Indication: PE, history of; Long term current use of anticoagulant.   Duration: indefinite Supervising physician: Aldine Contes  Anticoagulation Clinic Visit History: Patient does not report signs/symptoms of bleeding or thromboembolism  Other recent changes: No diet, medications, lifestyle changes endorsed by daughter.  Anticoagulation Episode Summary    Current INR goal:   2.0-3.0  TTR:   73.7 % (7.8 y)  Next INR check:   12/18/2018  INR from last check:     Weekly max warfarin dose:     Target end date:   Indefinite  INR check location:   Anticoagulation Clinic  Preferred lab:     Send INR reminders to:   [facility]   Indications   History of pulmonary embolus (PE) [Z86.711] Encounter for long-term use of antiplatelets/antithrombotics [Z79.02]       Comments:   History of multiple venous embolic episodes. Will continue to annual re-evaluate continued need for warfarin weighing risks vs. benefits.       Anticoagulation Care Providers    Provider Role Specialty Phone number   Bertha Stakes, MD  Internal Medicine [phone]      Allergies  Allergen Reactions  . Aricept [Donepezil Hcl] Other (See Comments)    Symptomatic Bradycardia.  . Shellfish Allergy Other (See Comments)    Causes gout Causes a flare up with Gout   Prior to Admission medications   Medication Sig Start Date End Date Taking? Authorizing Provider  acetaminophen (TYLENOL) 500 MG tablet Take 1,000 mg by mouth 2 (two) times daily.    Yes [provider]  atorvastatin (LIPITOR) 10 MG tablet Take 1 tablet (10 mg total) by mouth daily at 6 PM. 06/19/18  Yes Hoffman, Rachel Moulds, DO  Ca Phosphate-Cholecalciferol (CALCIUM/VITAMIN D3 GUMMIES) 250-350 MG-UNIT CHEW Chew 1 Dose by mouth 2 (two) times daily. 07/08/16  Yes [name], DO  carbidopa-levodopa (SINEMET IR)  25-100 MG tablet Take 1 tablet by mouth 4 (four) times daily. 05/02/18  Yes Garvin Fila, MD  loratadine (CLARITIN) 10 MG tablet Take 10 mg by mouth daily.   Yes [provider]  memantine (NAMENDA XR) 28 MG CP24 24 hr capsule TAKE 1 CAPSULE BY MOUTH EVERY DAY IN THE MORNING Patient taking differently: Take 28 mg by mouth daily.  08/09/18  Yes Garvin Fila, MD  Multiple Vitamins-Minerals (MULTIVITAMIN WITH MINERALS) tablet Take 1 tablet by mouth daily.    Yes [provider]  polyethylene glycol (MIRALAX / GLYCOLAX) packet Take 17 g by mouth daily as needed (constipation).   Yes [provider]  potassium chloride SA (KLOR-CON M20) 20 MEQ tablet Take 2 tablets (40 mEq total) by mouth daily. 06/27/18  Yes [name], DO  senna (SENOKOT) 8.6 MG TABS tablet Take 1 tablet (8.6 mg total) by mouth daily as needed for mild constipation. 07/13/16  Yes Velna Ochs, MD  STARCH-MALTO DEXTRIN ([facility] INSTANT FOOD THICKENER) POWD Take 1 Scoop by mouth daily as needed. Patient taking differently: Take 1 Scoop by mouth daily as needed (food thickner).  09/15/18  Yes Ledell Noss, MD  warfarin (COUMADIN) 5 MG tablet TAKE 1/2 TABLET BY MOUTH EVERY TUE, THU, SAT AND 1 TABLET BY MOUTH ALL OTHER DAYS Patient taking differently: Take 2.5-5 mg by mouth See admin instructions. Take 2.5 mg everyday except Wednesday and Friday 5 mg. 06/16/18  Yes [name],  DO  enoxaparin (LOVENOX) 100 MG/ML injection Inject 0.9 mLs (90 mg total) into the skin daily. Patient not taking: Reported on 11/20/2018 10/28/18   Neva Seat, MD   Past Medical History:  Diagnosis Date  . Anxiety   . Coronary artery disease, non-occlusive    with history of MI; Cath 2008 w multivessel nonobstructive CAD  . Dementia ([place])   . Depression   . GERD (gastroesophageal reflux disease)   . Hyperlipidemia   . Hypertension   . Memory loss   . Parkinson disease ([place])   . PE (pulmonary embolism)    unprovoked  PE completed 6 months of warfarin, warfarin d/ced 02/12/2010, repeat PE 07/10/2010 after a long car ride and now on lifelong coumadin.  . Pituitary microadenoma ([place])    incidental finding CT 12/2009  . Prostate cancer ([place])   . Scoliosis   . Sigmoid volvulus ([place]) 08/02/2016  . Stroke ([place]) 03/2018  . Volvulus of colon ([place]) 06/01/2017   Social History   Socioeconomic History  . Marital status: Married    Spouse name: Not on file  . Number of children: 4  . Years of education: master's  . Highest education level: Not on file  Occupational History  . Occupation: Retired    Fish farm manager: RETIRED  Social Needs  . Financial resource strain: Not on file  . Food insecurity:    Worry: Not on file    Inability: Not on file  . Transportation needs:    Medical: Not on file    Non-medical: Not on file  Tobacco Use  . Smoking status: Never Smoker  . Smokeless tobacco: Never Used  Substance and Sexual Activity  . Alcohol use: No    Alcohol/week: 0.0 standard drinks  . Drug use: No  . Sexual activity: Never  Lifestyle  . Physical activity:    Days per week: Not on file    Minutes per session: Not on file  . Stress: Not on file  Relationships  . Social connections:    Talks on phone: Not on file    Gets together: Not on file    Attends religious service: Not on file    Active member of club or organization: Not on file    Attends meetings of clubs or organizations: Not on file    Relationship status: Not on file  Other Topics Concern  . Not on file  Social History Narrative   Lives with daughter. Wife also has severe dementia.    Drinks 1 cup of coffee a day    Family History  Problem Relation Age of Onset  . Hypertension Father        Passed away from cerebral hemorrhage at age of 64.  [name] Dementia Mother   . Hypertension Child        4 adult children  . Cervical cancer Other   . Lung cancer Other   . Stroke Other   . Heart attack Neg Hx     ASSESSMENT Recent  Results: The most recent result is correlated with 27.5 mg per week: Lab Results  Component Value Date   INR 2.7 11/20/2018   INR 1.8 (A) 10/30/2018   INR 1.75 10/28/2018    Anticoagulation Dosing: Description   Give one-half (1/2) of  his 5mg  peach-colored warfarin on Mondays, Tuesdays, Thursdays and Saturdays. All other days, Sundays, Wednesdays and Fridays--give one (1) tablet by mouth each day at [facility].      INR today: Therapeutic  PLAN Weekly  dose was decreased by 9% to 25 mg per week  Patient Instructions  Patient's daughter instructed to give medications as defined in the Anti-coagulation Track section of this encounter.  Patient's daughter instructed to give today's dose.  Patient's daughter instructed to give one-half (1/2) of  his 5mg  peach-colored warfarin on Mondays, Tuesdays, Thursdays and Saturdays. All other days, Sundays, Wednesdays and Fridays--give one (1) tablet by mouth each day at [facility].  Patient's daughter verbalized understanding of these instructions.     Patient advised to contact clinic or seek medical attention if signs/symptoms of bleeding or thromboembolism occur.  Patient verbalized understanding by repeating back information and was advised to contact me if further medication-related questions arise. Patient was also provided an information handout.  Follow-up Return in 4 weeks (on 12/18/2018) for Follow up INR at 4:30PM.  Pennie Banter, PharmD, CPP  15 minutes spent face-to-face with the patient during the encounter. 50% of time spent on education, including signs/sx bleeding and clotting, as well as food and drug interactions with warfarin. 50% of time was spent on fingerprick POC INR sample collection,processing, results determination, and documentation in http://www.kim.net/.

## 2018-11-21 DIAGNOSIS — I110 Hypertensive heart disease with heart failure: Secondary | ICD-10-CM | POA: Diagnosis not present

## 2018-11-21 DIAGNOSIS — F0280 Dementia in other diseases classified elsewhere without behavioral disturbance: Secondary | ICD-10-CM | POA: Diagnosis not present

## 2018-11-21 DIAGNOSIS — I509 Heart failure, unspecified: Secondary | ICD-10-CM | POA: Diagnosis not present

## 2018-11-21 DIAGNOSIS — R001 Bradycardia, unspecified: Secondary | ICD-10-CM | POA: Diagnosis not present

## 2018-11-21 DIAGNOSIS — G20 Parkinson's disease: Secondary | ICD-10-CM | POA: Diagnosis not present

## 2018-11-21 DIAGNOSIS — R531 Weakness: Secondary | ICD-10-CM | POA: Diagnosis not present

## 2018-11-22 DIAGNOSIS — I110 Hypertensive heart disease with heart failure: Secondary | ICD-10-CM | POA: Diagnosis not present

## 2018-11-22 DIAGNOSIS — G20 Parkinson's disease: Secondary | ICD-10-CM | POA: Diagnosis not present

## 2018-11-22 DIAGNOSIS — R531 Weakness: Secondary | ICD-10-CM | POA: Diagnosis not present

## 2018-11-22 DIAGNOSIS — F0280 Dementia in other diseases classified elsewhere without behavioral disturbance: Secondary | ICD-10-CM | POA: Diagnosis not present

## 2018-11-22 DIAGNOSIS — R001 Bradycardia, unspecified: Secondary | ICD-10-CM | POA: Diagnosis not present

## 2018-11-22 DIAGNOSIS — I509 Heart failure, unspecified: Secondary | ICD-10-CM | POA: Diagnosis not present

## 2018-11-23 DIAGNOSIS — F0280 Dementia in other diseases classified elsewhere without behavioral disturbance: Secondary | ICD-10-CM | POA: Diagnosis not present

## 2018-11-23 DIAGNOSIS — I110 Hypertensive heart disease with heart failure: Secondary | ICD-10-CM | POA: Diagnosis not present

## 2018-11-23 DIAGNOSIS — R001 Bradycardia, unspecified: Secondary | ICD-10-CM | POA: Diagnosis not present

## 2018-11-23 DIAGNOSIS — I509 Heart failure, unspecified: Secondary | ICD-10-CM | POA: Diagnosis not present

## 2018-11-23 DIAGNOSIS — R531 Weakness: Secondary | ICD-10-CM | POA: Diagnosis not present

## 2018-11-23 DIAGNOSIS — G20 Parkinson's disease: Secondary | ICD-10-CM | POA: Diagnosis not present

## 2018-11-24 DIAGNOSIS — F0280 Dementia in other diseases classified elsewhere without behavioral disturbance: Secondary | ICD-10-CM | POA: Diagnosis not present

## 2018-11-24 DIAGNOSIS — G20 Parkinson's disease: Secondary | ICD-10-CM | POA: Diagnosis not present

## 2018-11-24 DIAGNOSIS — R531 Weakness: Secondary | ICD-10-CM | POA: Diagnosis not present

## 2018-11-24 DIAGNOSIS — I509 Heart failure, unspecified: Secondary | ICD-10-CM | POA: Diagnosis not present

## 2018-11-24 DIAGNOSIS — I110 Hypertensive heart disease with heart failure: Secondary | ICD-10-CM | POA: Diagnosis not present

## 2018-11-24 DIAGNOSIS — R001 Bradycardia, unspecified: Secondary | ICD-10-CM | POA: Diagnosis not present

## 2018-11-27 DIAGNOSIS — F0280 Dementia in other diseases classified elsewhere without behavioral disturbance: Secondary | ICD-10-CM | POA: Diagnosis not present

## 2018-11-27 DIAGNOSIS — I509 Heart failure, unspecified: Secondary | ICD-10-CM | POA: Diagnosis not present

## 2018-11-27 DIAGNOSIS — R531 Weakness: Secondary | ICD-10-CM | POA: Diagnosis not present

## 2018-11-27 DIAGNOSIS — G20 Parkinson's disease: Secondary | ICD-10-CM | POA: Diagnosis not present

## 2018-11-27 DIAGNOSIS — R001 Bradycardia, unspecified: Secondary | ICD-10-CM | POA: Diagnosis not present

## 2018-11-27 DIAGNOSIS — I110 Hypertensive heart disease with heart failure: Secondary | ICD-10-CM | POA: Diagnosis not present

## 2018-11-28 DIAGNOSIS — F0280 Dementia in other diseases classified elsewhere without behavioral disturbance: Secondary | ICD-10-CM | POA: Diagnosis not present

## 2018-11-28 DIAGNOSIS — R531 Weakness: Secondary | ICD-10-CM | POA: Diagnosis not present

## 2018-11-28 DIAGNOSIS — I110 Hypertensive heart disease with heart failure: Secondary | ICD-10-CM | POA: Diagnosis not present

## 2018-11-28 DIAGNOSIS — I509 Heart failure, unspecified: Secondary | ICD-10-CM | POA: Diagnosis not present

## 2018-11-28 DIAGNOSIS — R001 Bradycardia, unspecified: Secondary | ICD-10-CM | POA: Diagnosis not present

## 2018-11-28 DIAGNOSIS — G20 Parkinson's disease: Secondary | ICD-10-CM | POA: Diagnosis not present

## 2018-11-29 DIAGNOSIS — F0280 Dementia in other diseases classified elsewhere without behavioral disturbance: Secondary | ICD-10-CM | POA: Diagnosis not present

## 2018-11-29 DIAGNOSIS — R001 Bradycardia, unspecified: Secondary | ICD-10-CM | POA: Diagnosis not present

## 2018-11-29 DIAGNOSIS — I110 Hypertensive heart disease with heart failure: Secondary | ICD-10-CM | POA: Diagnosis not present

## 2018-11-29 DIAGNOSIS — G20 Parkinson's disease: Secondary | ICD-10-CM | POA: Diagnosis not present

## 2018-11-29 DIAGNOSIS — R531 Weakness: Secondary | ICD-10-CM | POA: Diagnosis not present

## 2018-11-29 DIAGNOSIS — I509 Heart failure, unspecified: Secondary | ICD-10-CM | POA: Diagnosis not present

## 2018-11-30 DIAGNOSIS — R001 Bradycardia, unspecified: Secondary | ICD-10-CM | POA: Diagnosis not present

## 2018-11-30 DIAGNOSIS — R531 Weakness: Secondary | ICD-10-CM | POA: Diagnosis not present

## 2018-11-30 DIAGNOSIS — G20 Parkinson's disease: Secondary | ICD-10-CM | POA: Diagnosis not present

## 2018-11-30 DIAGNOSIS — F0280 Dementia in other diseases classified elsewhere without behavioral disturbance: Secondary | ICD-10-CM | POA: Diagnosis not present

## 2018-11-30 DIAGNOSIS — I110 Hypertensive heart disease with heart failure: Secondary | ICD-10-CM | POA: Diagnosis not present

## 2018-11-30 DIAGNOSIS — I509 Heart failure, unspecified: Secondary | ICD-10-CM | POA: Diagnosis not present

## 2018-12-01 DIAGNOSIS — G20 Parkinson's disease: Secondary | ICD-10-CM | POA: Diagnosis not present

## 2018-12-01 DIAGNOSIS — F0280 Dementia in other diseases classified elsewhere without behavioral disturbance: Secondary | ICD-10-CM | POA: Diagnosis not present

## 2018-12-01 DIAGNOSIS — I110 Hypertensive heart disease with heart failure: Secondary | ICD-10-CM | POA: Diagnosis not present

## 2018-12-01 DIAGNOSIS — R531 Weakness: Secondary | ICD-10-CM | POA: Diagnosis not present

## 2018-12-01 DIAGNOSIS — R001 Bradycardia, unspecified: Secondary | ICD-10-CM | POA: Diagnosis not present

## 2018-12-01 DIAGNOSIS — I509 Heart failure, unspecified: Secondary | ICD-10-CM | POA: Diagnosis not present

## 2018-12-04 DIAGNOSIS — I509 Heart failure, unspecified: Secondary | ICD-10-CM | POA: Diagnosis not present

## 2018-12-04 DIAGNOSIS — I110 Hypertensive heart disease with heart failure: Secondary | ICD-10-CM | POA: Diagnosis not present

## 2018-12-04 DIAGNOSIS — R531 Weakness: Secondary | ICD-10-CM | POA: Diagnosis not present

## 2018-12-04 DIAGNOSIS — G20 Parkinson's disease: Secondary | ICD-10-CM | POA: Diagnosis not present

## 2018-12-04 DIAGNOSIS — R001 Bradycardia, unspecified: Secondary | ICD-10-CM | POA: Diagnosis not present

## 2018-12-04 DIAGNOSIS — F0280 Dementia in other diseases classified elsewhere without behavioral disturbance: Secondary | ICD-10-CM | POA: Diagnosis not present

## 2018-12-05 DIAGNOSIS — I509 Heart failure, unspecified: Secondary | ICD-10-CM | POA: Diagnosis not present

## 2018-12-05 DIAGNOSIS — F0280 Dementia in other diseases classified elsewhere without behavioral disturbance: Secondary | ICD-10-CM | POA: Diagnosis not present

## 2018-12-05 DIAGNOSIS — G20 Parkinson's disease: Secondary | ICD-10-CM | POA: Diagnosis not present

## 2018-12-05 DIAGNOSIS — R001 Bradycardia, unspecified: Secondary | ICD-10-CM | POA: Diagnosis not present

## 2018-12-05 DIAGNOSIS — R531 Weakness: Secondary | ICD-10-CM | POA: Diagnosis not present

## 2018-12-05 DIAGNOSIS — I110 Hypertensive heart disease with heart failure: Secondary | ICD-10-CM | POA: Diagnosis not present

## 2018-12-06 ENCOUNTER — Encounter: Payer: Self-pay | Admitting: Pharmacist

## 2018-12-06 ENCOUNTER — Other Ambulatory Visit: Payer: Self-pay | Admitting: Pharmacist

## 2018-12-06 DIAGNOSIS — F0280 Dementia in other diseases classified elsewhere without behavioral disturbance: Secondary | ICD-10-CM | POA: Diagnosis not present

## 2018-12-06 DIAGNOSIS — R001 Bradycardia, unspecified: Secondary | ICD-10-CM | POA: Diagnosis not present

## 2018-12-06 DIAGNOSIS — Z7902 Long term (current) use of antithrombotics/antiplatelets: Secondary | ICD-10-CM

## 2018-12-06 DIAGNOSIS — Z86711 Personal history of pulmonary embolism: Secondary | ICD-10-CM

## 2018-12-06 DIAGNOSIS — I509 Heart failure, unspecified: Secondary | ICD-10-CM | POA: Diagnosis not present

## 2018-12-06 DIAGNOSIS — I110 Hypertensive heart disease with heart failure: Secondary | ICD-10-CM | POA: Diagnosis not present

## 2018-12-06 DIAGNOSIS — G20 Parkinson's disease: Secondary | ICD-10-CM | POA: Diagnosis not present

## 2018-12-06 DIAGNOSIS — R531 Weakness: Secondary | ICD-10-CM | POA: Diagnosis not present

## 2018-12-07 DIAGNOSIS — G20 Parkinson's disease: Secondary | ICD-10-CM | POA: Diagnosis not present

## 2018-12-07 DIAGNOSIS — F0280 Dementia in other diseases classified elsewhere without behavioral disturbance: Secondary | ICD-10-CM | POA: Diagnosis not present

## 2018-12-07 DIAGNOSIS — I509 Heart failure, unspecified: Secondary | ICD-10-CM | POA: Diagnosis not present

## 2018-12-07 DIAGNOSIS — R531 Weakness: Secondary | ICD-10-CM | POA: Diagnosis not present

## 2018-12-07 DIAGNOSIS — I110 Hypertensive heart disease with heart failure: Secondary | ICD-10-CM | POA: Diagnosis not present

## 2018-12-07 DIAGNOSIS — R001 Bradycardia, unspecified: Secondary | ICD-10-CM | POA: Diagnosis not present

## 2018-12-08 ENCOUNTER — Other Ambulatory Visit: Payer: Self-pay | Admitting: *Deleted

## 2018-12-08 DIAGNOSIS — R001 Bradycardia, unspecified: Secondary | ICD-10-CM | POA: Diagnosis not present

## 2018-12-08 DIAGNOSIS — F0280 Dementia in other diseases classified elsewhere without behavioral disturbance: Secondary | ICD-10-CM | POA: Diagnosis not present

## 2018-12-08 DIAGNOSIS — Z7902 Long term (current) use of antithrombotics/antiplatelets: Secondary | ICD-10-CM

## 2018-12-08 DIAGNOSIS — R531 Weakness: Secondary | ICD-10-CM | POA: Diagnosis not present

## 2018-12-08 DIAGNOSIS — I509 Heart failure, unspecified: Secondary | ICD-10-CM | POA: Diagnosis not present

## 2018-12-08 DIAGNOSIS — I110 Hypertensive heart disease with heart failure: Secondary | ICD-10-CM | POA: Diagnosis not present

## 2018-12-08 DIAGNOSIS — G20 Parkinson's disease: Secondary | ICD-10-CM | POA: Diagnosis not present

## 2018-12-08 DIAGNOSIS — Z86711 Personal history of pulmonary embolism: Secondary | ICD-10-CM

## 2018-12-11 ENCOUNTER — Other Ambulatory Visit: Payer: Self-pay | Admitting: *Deleted

## 2018-12-11 DIAGNOSIS — I110 Hypertensive heart disease with heart failure: Secondary | ICD-10-CM | POA: Diagnosis not present

## 2018-12-11 DIAGNOSIS — G20 Parkinson's disease: Secondary | ICD-10-CM | POA: Diagnosis not present

## 2018-12-11 DIAGNOSIS — R531 Weakness: Secondary | ICD-10-CM | POA: Diagnosis not present

## 2018-12-11 DIAGNOSIS — I509 Heart failure, unspecified: Secondary | ICD-10-CM | POA: Diagnosis not present

## 2018-12-11 DIAGNOSIS — F0280 Dementia in other diseases classified elsewhere without behavioral disturbance: Secondary | ICD-10-CM | POA: Diagnosis not present

## 2018-12-11 DIAGNOSIS — R001 Bradycardia, unspecified: Secondary | ICD-10-CM | POA: Diagnosis not present

## 2018-12-12 ENCOUNTER — Telehealth: Payer: Self-pay | Admitting: *Deleted

## 2018-12-12 DIAGNOSIS — I509 Heart failure, unspecified: Secondary | ICD-10-CM | POA: Diagnosis not present

## 2018-12-12 DIAGNOSIS — F0280 Dementia in other diseases classified elsewhere without behavioral disturbance: Secondary | ICD-10-CM | POA: Diagnosis not present

## 2018-12-12 DIAGNOSIS — R001 Bradycardia, unspecified: Secondary | ICD-10-CM | POA: Diagnosis not present

## 2018-12-12 DIAGNOSIS — I110 Hypertensive heart disease with heart failure: Secondary | ICD-10-CM | POA: Diagnosis not present

## 2018-12-12 DIAGNOSIS — G20 Parkinson's disease: Secondary | ICD-10-CM | POA: Diagnosis not present

## 2018-12-12 DIAGNOSIS — R531 Weakness: Secondary | ICD-10-CM | POA: Diagnosis not present

## 2018-12-12 NOTE — Telephone Encounter (Signed)
Atorvastatin was added after ICH by neurology, as previously documented this casused elevated CK and elevated AST, on review AST is elevated again after starting atorvastatin.  Discussed with daughter [name] that I recommend discontining atorvastatin and she agreed, will decline this refill.  Also discussed INR monitoring. [name] is concerned about going out of the house with Myrl given COVID pandemic, I agree, would be better to do at home testing for him.  I sent in order for [facility].  She will complete with patient authorization form.

## 2018-12-12 NOTE — Telephone Encounter (Signed)
Discussed with andrea, she is taking all the right precautions (as well as her daughter and the red cross) I reinforced symptoms to watch out for- sore throat fever, SOB, myalgias, and to call if she or Orian develops these, however SOB is likely the key symptom that will prompt testing or need for hospitalization.

## 2018-12-12 NOTE — Telephone Encounter (Addendum)
Patient's daughter, Lilli Light called in to report that her daughter works for [employer] and learned today that she was in a meeting 2 weeks ago with someone who is now positive for Covid-19. She now has to self quarantine for 14 days. Granddaughter has not had close contact with patient but has hugged her mom who is in close contact with patient everyday. No one has any symptoms at this time. Would like to know what PCP recommends. Hubbard Hartshorn, RN, BSN

## 2018-12-13 DIAGNOSIS — R001 Bradycardia, unspecified: Secondary | ICD-10-CM | POA: Diagnosis not present

## 2018-12-13 DIAGNOSIS — I509 Heart failure, unspecified: Secondary | ICD-10-CM | POA: Diagnosis not present

## 2018-12-13 DIAGNOSIS — G20 Parkinson's disease: Secondary | ICD-10-CM | POA: Diagnosis not present

## 2018-12-13 DIAGNOSIS — I110 Hypertensive heart disease with heart failure: Secondary | ICD-10-CM | POA: Diagnosis not present

## 2018-12-13 DIAGNOSIS — R531 Weakness: Secondary | ICD-10-CM | POA: Diagnosis not present

## 2018-12-13 DIAGNOSIS — F0280 Dementia in other diseases classified elsewhere without behavioral disturbance: Secondary | ICD-10-CM | POA: Diagnosis not present

## 2018-12-14 DIAGNOSIS — I110 Hypertensive heart disease with heart failure: Secondary | ICD-10-CM | POA: Diagnosis not present

## 2018-12-14 DIAGNOSIS — R001 Bradycardia, unspecified: Secondary | ICD-10-CM | POA: Diagnosis not present

## 2018-12-14 DIAGNOSIS — F0280 Dementia in other diseases classified elsewhere without behavioral disturbance: Secondary | ICD-10-CM | POA: Diagnosis not present

## 2018-12-14 DIAGNOSIS — I509 Heart failure, unspecified: Secondary | ICD-10-CM | POA: Diagnosis not present

## 2018-12-14 DIAGNOSIS — G20 Parkinson's disease: Secondary | ICD-10-CM | POA: Diagnosis not present

## 2018-12-14 DIAGNOSIS — R531 Weakness: Secondary | ICD-10-CM | POA: Diagnosis not present

## 2018-12-15 DIAGNOSIS — I110 Hypertensive heart disease with heart failure: Secondary | ICD-10-CM | POA: Diagnosis not present

## 2018-12-15 DIAGNOSIS — I509 Heart failure, unspecified: Secondary | ICD-10-CM | POA: Diagnosis not present

## 2018-12-15 DIAGNOSIS — F0280 Dementia in other diseases classified elsewhere without behavioral disturbance: Secondary | ICD-10-CM | POA: Diagnosis not present

## 2018-12-15 DIAGNOSIS — R001 Bradycardia, unspecified: Secondary | ICD-10-CM | POA: Diagnosis not present

## 2018-12-15 DIAGNOSIS — R531 Weakness: Secondary | ICD-10-CM | POA: Diagnosis not present

## 2018-12-15 DIAGNOSIS — G20 Parkinson's disease: Secondary | ICD-10-CM | POA: Diagnosis not present

## 2018-12-18 ENCOUNTER — Ambulatory Visit: Payer: Medicare HMO

## 2018-12-18 DIAGNOSIS — Z86711 Personal history of pulmonary embolism: Secondary | ICD-10-CM | POA: Diagnosis not present

## 2018-12-18 DIAGNOSIS — I509 Heart failure, unspecified: Secondary | ICD-10-CM | POA: Diagnosis not present

## 2018-12-18 DIAGNOSIS — F0280 Dementia in other diseases classified elsewhere without behavioral disturbance: Secondary | ICD-10-CM | POA: Diagnosis not present

## 2018-12-18 DIAGNOSIS — G20 Parkinson's disease: Secondary | ICD-10-CM | POA: Diagnosis not present

## 2018-12-18 DIAGNOSIS — R001 Bradycardia, unspecified: Secondary | ICD-10-CM | POA: Diagnosis not present

## 2018-12-18 DIAGNOSIS — I110 Hypertensive heart disease with heart failure: Secondary | ICD-10-CM | POA: Diagnosis not present

## 2018-12-18 DIAGNOSIS — Z7902 Long term (current) use of antithrombotics/antiplatelets: Secondary | ICD-10-CM | POA: Diagnosis not present

## 2018-12-18 DIAGNOSIS — R531 Weakness: Secondary | ICD-10-CM | POA: Diagnosis not present

## 2018-12-19 ENCOUNTER — Other Ambulatory Visit: Payer: Self-pay | Admitting: *Deleted

## 2018-12-19 ENCOUNTER — Telehealth: Payer: Self-pay | Admitting: Pharmacist

## 2018-12-19 DIAGNOSIS — F0280 Dementia in other diseases classified elsewhere without behavioral disturbance: Secondary | ICD-10-CM | POA: Diagnosis not present

## 2018-12-19 DIAGNOSIS — I110 Hypertensive heart disease with heart failure: Secondary | ICD-10-CM | POA: Diagnosis not present

## 2018-12-19 DIAGNOSIS — R001 Bradycardia, unspecified: Secondary | ICD-10-CM | POA: Diagnosis not present

## 2018-12-19 DIAGNOSIS — G20 Parkinson's disease: Secondary | ICD-10-CM | POA: Diagnosis not present

## 2018-12-19 DIAGNOSIS — I509 Heart failure, unspecified: Secondary | ICD-10-CM | POA: Diagnosis not present

## 2018-12-19 DIAGNOSIS — Z7902 Long term (current) use of antithrombotics/antiplatelets: Secondary | ICD-10-CM

## 2018-12-19 DIAGNOSIS — Z86711 Personal history of pulmonary embolism: Secondary | ICD-10-CM

## 2018-12-19 DIAGNOSIS — R531 Weakness: Secondary | ICD-10-CM | POA: Diagnosis not present

## 2018-12-19 LAB — PROTIME-INR
INR: 2.7 — ABNORMAL HIGH (ref 0.8–1.2)
Prothrombin Time: 27.7 s — ABNORMAL HIGH (ref 9.1–12.0)

## 2018-12-19 NOTE — Telephone Encounter (Signed)
Patient had PT/INR performed at [facility] during [place], preventing him from accessing the Anticoagulation Management Clinic within the [place]. Patient was called, provided results of INR and instructed to maintain current regimen of warfarin (one tablet on Su/We/Fri; 1/2 tablet on all other days). To repeat INR on 15-Jan-2019 at [facility]. Denies any signs or symptoms of bleeding or embolic disease.

## 2018-12-20 DIAGNOSIS — M1A00X0 Idiopathic chronic gout, unspecified site, without tophus (tophi): Secondary | ICD-10-CM | POA: Diagnosis not present

## 2018-12-20 DIAGNOSIS — R531 Weakness: Secondary | ICD-10-CM | POA: Diagnosis not present

## 2018-12-20 DIAGNOSIS — R269 Unspecified abnormalities of gait and mobility: Secondary | ICD-10-CM | POA: Diagnosis not present

## 2018-12-20 DIAGNOSIS — F0280 Dementia in other diseases classified elsewhere without behavioral disturbance: Secondary | ICD-10-CM | POA: Diagnosis not present

## 2018-12-20 DIAGNOSIS — I110 Hypertensive heart disease with heart failure: Secondary | ICD-10-CM | POA: Diagnosis not present

## 2018-12-20 DIAGNOSIS — R32 Unspecified urinary incontinence: Secondary | ICD-10-CM | POA: Diagnosis not present

## 2018-12-20 DIAGNOSIS — Z515 Encounter for palliative care: Secondary | ICD-10-CM | POA: Diagnosis not present

## 2018-12-20 DIAGNOSIS — R159 Full incontinence of feces: Secondary | ICD-10-CM | POA: Diagnosis not present

## 2018-12-20 DIAGNOSIS — G20 Parkinson's disease: Secondary | ICD-10-CM | POA: Diagnosis not present

## 2018-12-20 DIAGNOSIS — R001 Bradycardia, unspecified: Secondary | ICD-10-CM | POA: Diagnosis not present

## 2018-12-20 DIAGNOSIS — I509 Heart failure, unspecified: Secondary | ICD-10-CM | POA: Diagnosis not present

## 2018-12-21 ENCOUNTER — Other Ambulatory Visit: Payer: Self-pay | Admitting: *Deleted

## 2018-12-21 DIAGNOSIS — I110 Hypertensive heart disease with heart failure: Secondary | ICD-10-CM | POA: Diagnosis not present

## 2018-12-21 DIAGNOSIS — I509 Heart failure, unspecified: Secondary | ICD-10-CM | POA: Diagnosis not present

## 2018-12-21 DIAGNOSIS — G20 Parkinson's disease: Secondary | ICD-10-CM | POA: Diagnosis not present

## 2018-12-21 DIAGNOSIS — R531 Weakness: Secondary | ICD-10-CM | POA: Diagnosis not present

## 2018-12-21 DIAGNOSIS — F0280 Dementia in other diseases classified elsewhere without behavioral disturbance: Secondary | ICD-10-CM | POA: Diagnosis not present

## 2018-12-21 DIAGNOSIS — R001 Bradycardia, unspecified: Secondary | ICD-10-CM | POA: Diagnosis not present

## 2018-12-22 DIAGNOSIS — G20 Parkinson's disease: Secondary | ICD-10-CM | POA: Diagnosis not present

## 2018-12-22 DIAGNOSIS — R531 Weakness: Secondary | ICD-10-CM | POA: Diagnosis not present

## 2018-12-22 DIAGNOSIS — F0280 Dementia in other diseases classified elsewhere without behavioral disturbance: Secondary | ICD-10-CM | POA: Diagnosis not present

## 2018-12-22 DIAGNOSIS — I110 Hypertensive heart disease with heart failure: Secondary | ICD-10-CM | POA: Diagnosis not present

## 2018-12-22 DIAGNOSIS — I509 Heart failure, unspecified: Secondary | ICD-10-CM | POA: Diagnosis not present

## 2018-12-22 DIAGNOSIS — R001 Bradycardia, unspecified: Secondary | ICD-10-CM | POA: Diagnosis not present

## 2018-12-22 MED ORDER — WARFARIN SODIUM 5 MG PO TABS: ORAL_TABLET | ORAL | 1 refills | Status: DC

## 2018-12-25 DIAGNOSIS — F0280 Dementia in other diseases classified elsewhere without behavioral disturbance: Secondary | ICD-10-CM | POA: Diagnosis not present

## 2018-12-25 DIAGNOSIS — G20 Parkinson's disease: Secondary | ICD-10-CM | POA: Diagnosis not present

## 2018-12-25 DIAGNOSIS — I509 Heart failure, unspecified: Secondary | ICD-10-CM | POA: Diagnosis not present

## 2018-12-25 DIAGNOSIS — R531 Weakness: Secondary | ICD-10-CM | POA: Diagnosis not present

## 2018-12-25 DIAGNOSIS — I110 Hypertensive heart disease with heart failure: Secondary | ICD-10-CM | POA: Diagnosis not present

## 2018-12-25 DIAGNOSIS — R001 Bradycardia, unspecified: Secondary | ICD-10-CM | POA: Diagnosis not present

## 2018-12-26 DIAGNOSIS — G20 Parkinson's disease: Secondary | ICD-10-CM | POA: Diagnosis not present

## 2018-12-26 DIAGNOSIS — I509 Heart failure, unspecified: Secondary | ICD-10-CM | POA: Diagnosis not present

## 2018-12-26 DIAGNOSIS — I110 Hypertensive heart disease with heart failure: Secondary | ICD-10-CM | POA: Diagnosis not present

## 2018-12-26 DIAGNOSIS — F0280 Dementia in other diseases classified elsewhere without behavioral disturbance: Secondary | ICD-10-CM | POA: Diagnosis not present

## 2018-12-26 DIAGNOSIS — R531 Weakness: Secondary | ICD-10-CM | POA: Diagnosis not present

## 2018-12-26 DIAGNOSIS — R001 Bradycardia, unspecified: Secondary | ICD-10-CM | POA: Diagnosis not present

## 2018-12-27 DIAGNOSIS — R001 Bradycardia, unspecified: Secondary | ICD-10-CM | POA: Diagnosis not present

## 2018-12-27 DIAGNOSIS — R531 Weakness: Secondary | ICD-10-CM | POA: Diagnosis not present

## 2018-12-27 DIAGNOSIS — I110 Hypertensive heart disease with heart failure: Secondary | ICD-10-CM | POA: Diagnosis not present

## 2018-12-27 DIAGNOSIS — G20 Parkinson's disease: Secondary | ICD-10-CM | POA: Diagnosis not present

## 2018-12-27 DIAGNOSIS — I509 Heart failure, unspecified: Secondary | ICD-10-CM | POA: Diagnosis not present

## 2018-12-27 DIAGNOSIS — F0280 Dementia in other diseases classified elsewhere without behavioral disturbance: Secondary | ICD-10-CM | POA: Diagnosis not present

## 2018-12-28 DIAGNOSIS — R531 Weakness: Secondary | ICD-10-CM | POA: Diagnosis not present

## 2018-12-28 DIAGNOSIS — I509 Heart failure, unspecified: Secondary | ICD-10-CM | POA: Diagnosis not present

## 2018-12-28 DIAGNOSIS — G20 Parkinson's disease: Secondary | ICD-10-CM | POA: Diagnosis not present

## 2018-12-28 DIAGNOSIS — F0280 Dementia in other diseases classified elsewhere without behavioral disturbance: Secondary | ICD-10-CM | POA: Diagnosis not present

## 2018-12-28 DIAGNOSIS — I110 Hypertensive heart disease with heart failure: Secondary | ICD-10-CM | POA: Diagnosis not present

## 2018-12-28 DIAGNOSIS — R001 Bradycardia, unspecified: Secondary | ICD-10-CM | POA: Diagnosis not present

## 2018-12-29 DIAGNOSIS — F0280 Dementia in other diseases classified elsewhere without behavioral disturbance: Secondary | ICD-10-CM | POA: Diagnosis not present

## 2018-12-29 DIAGNOSIS — I110 Hypertensive heart disease with heart failure: Secondary | ICD-10-CM | POA: Diagnosis not present

## 2018-12-29 DIAGNOSIS — R531 Weakness: Secondary | ICD-10-CM | POA: Diagnosis not present

## 2018-12-29 DIAGNOSIS — G20 Parkinson's disease: Secondary | ICD-10-CM | POA: Diagnosis not present

## 2018-12-29 DIAGNOSIS — R001 Bradycardia, unspecified: Secondary | ICD-10-CM | POA: Diagnosis not present

## 2018-12-29 DIAGNOSIS — I509 Heart failure, unspecified: Secondary | ICD-10-CM | POA: Diagnosis not present

## 2019-01-01 DIAGNOSIS — I509 Heart failure, unspecified: Secondary | ICD-10-CM | POA: Diagnosis not present

## 2019-01-01 DIAGNOSIS — I110 Hypertensive heart disease with heart failure: Secondary | ICD-10-CM | POA: Diagnosis not present

## 2019-01-01 DIAGNOSIS — R001 Bradycardia, unspecified: Secondary | ICD-10-CM | POA: Diagnosis not present

## 2019-01-01 DIAGNOSIS — R531 Weakness: Secondary | ICD-10-CM | POA: Diagnosis not present

## 2019-01-01 DIAGNOSIS — G20 Parkinson's disease: Secondary | ICD-10-CM | POA: Diagnosis not present

## 2019-01-01 DIAGNOSIS — F0280 Dementia in other diseases classified elsewhere without behavioral disturbance: Secondary | ICD-10-CM | POA: Diagnosis not present

## 2019-01-02 DIAGNOSIS — F0280 Dementia in other diseases classified elsewhere without behavioral disturbance: Secondary | ICD-10-CM | POA: Diagnosis not present

## 2019-01-02 DIAGNOSIS — I110 Hypertensive heart disease with heart failure: Secondary | ICD-10-CM | POA: Diagnosis not present

## 2019-01-02 DIAGNOSIS — R001 Bradycardia, unspecified: Secondary | ICD-10-CM | POA: Diagnosis not present

## 2019-01-02 DIAGNOSIS — R531 Weakness: Secondary | ICD-10-CM | POA: Diagnosis not present

## 2019-01-02 DIAGNOSIS — G20 Parkinson's disease: Secondary | ICD-10-CM | POA: Diagnosis not present

## 2019-01-02 DIAGNOSIS — I509 Heart failure, unspecified: Secondary | ICD-10-CM | POA: Diagnosis not present

## 2019-01-03 DIAGNOSIS — I509 Heart failure, unspecified: Secondary | ICD-10-CM | POA: Diagnosis not present

## 2019-01-03 DIAGNOSIS — F0280 Dementia in other diseases classified elsewhere without behavioral disturbance: Secondary | ICD-10-CM | POA: Diagnosis not present

## 2019-01-03 DIAGNOSIS — G20 Parkinson's disease: Secondary | ICD-10-CM | POA: Diagnosis not present

## 2019-01-03 DIAGNOSIS — I110 Hypertensive heart disease with heart failure: Secondary | ICD-10-CM | POA: Diagnosis not present

## 2019-01-03 DIAGNOSIS — R531 Weakness: Secondary | ICD-10-CM | POA: Diagnosis not present

## 2019-01-03 DIAGNOSIS — R001 Bradycardia, unspecified: Secondary | ICD-10-CM | POA: Diagnosis not present

## 2019-01-04 DIAGNOSIS — R001 Bradycardia, unspecified: Secondary | ICD-10-CM | POA: Diagnosis not present

## 2019-01-04 DIAGNOSIS — I509 Heart failure, unspecified: Secondary | ICD-10-CM | POA: Diagnosis not present

## 2019-01-04 DIAGNOSIS — I110 Hypertensive heart disease with heart failure: Secondary | ICD-10-CM | POA: Diagnosis not present

## 2019-01-04 DIAGNOSIS — F0280 Dementia in other diseases classified elsewhere without behavioral disturbance: Secondary | ICD-10-CM | POA: Diagnosis not present

## 2019-01-04 DIAGNOSIS — R531 Weakness: Secondary | ICD-10-CM | POA: Diagnosis not present

## 2019-01-04 DIAGNOSIS — G20 Parkinson's disease: Secondary | ICD-10-CM | POA: Diagnosis not present

## 2019-01-05 DIAGNOSIS — G20 Parkinson's disease: Secondary | ICD-10-CM | POA: Diagnosis not present

## 2019-01-05 DIAGNOSIS — F0280 Dementia in other diseases classified elsewhere without behavioral disturbance: Secondary | ICD-10-CM | POA: Diagnosis not present

## 2019-01-05 DIAGNOSIS — I110 Hypertensive heart disease with heart failure: Secondary | ICD-10-CM | POA: Diagnosis not present

## 2019-01-05 DIAGNOSIS — R531 Weakness: Secondary | ICD-10-CM | POA: Diagnosis not present

## 2019-01-05 DIAGNOSIS — I509 Heart failure, unspecified: Secondary | ICD-10-CM | POA: Diagnosis not present

## 2019-01-05 DIAGNOSIS — R001 Bradycardia, unspecified: Secondary | ICD-10-CM | POA: Diagnosis not present

## 2019-01-06 ENCOUNTER — Other Ambulatory Visit: Payer: Self-pay | Admitting: Neurology

## 2019-01-08 ENCOUNTER — Other Ambulatory Visit: Payer: Self-pay

## 2019-01-08 DIAGNOSIS — G20 Parkinson's disease: Secondary | ICD-10-CM | POA: Diagnosis not present

## 2019-01-08 DIAGNOSIS — I110 Hypertensive heart disease with heart failure: Secondary | ICD-10-CM | POA: Diagnosis not present

## 2019-01-08 DIAGNOSIS — F0280 Dementia in other diseases classified elsewhere without behavioral disturbance: Secondary | ICD-10-CM | POA: Diagnosis not present

## 2019-01-08 DIAGNOSIS — R531 Weakness: Secondary | ICD-10-CM | POA: Diagnosis not present

## 2019-01-08 DIAGNOSIS — I509 Heart failure, unspecified: Secondary | ICD-10-CM | POA: Diagnosis not present

## 2019-01-08 DIAGNOSIS — R001 Bradycardia, unspecified: Secondary | ICD-10-CM | POA: Diagnosis not present

## 2019-01-08 MED ORDER — CARBIDOPA-LEVODOPA 25-100 MG PO TABS: 1.0000 | ORAL_TABLET | Freq: Four times a day (QID) | ORAL | 1 refills | Status: DC

## 2019-01-09 DIAGNOSIS — F0280 Dementia in other diseases classified elsewhere without behavioral disturbance: Secondary | ICD-10-CM | POA: Diagnosis not present

## 2019-01-09 DIAGNOSIS — G20 Parkinson's disease: Secondary | ICD-10-CM | POA: Diagnosis not present

## 2019-01-09 DIAGNOSIS — I509 Heart failure, unspecified: Secondary | ICD-10-CM | POA: Diagnosis not present

## 2019-01-09 DIAGNOSIS — I110 Hypertensive heart disease with heart failure: Secondary | ICD-10-CM | POA: Diagnosis not present

## 2019-01-09 DIAGNOSIS — R001 Bradycardia, unspecified: Secondary | ICD-10-CM | POA: Diagnosis not present

## 2019-01-09 DIAGNOSIS — R531 Weakness: Secondary | ICD-10-CM | POA: Diagnosis not present

## 2019-01-10 ENCOUNTER — Other Ambulatory Visit: Payer: Self-pay | Admitting: Neurology

## 2019-01-10 DIAGNOSIS — R531 Weakness: Secondary | ICD-10-CM | POA: Diagnosis not present

## 2019-01-10 DIAGNOSIS — I110 Hypertensive heart disease with heart failure: Secondary | ICD-10-CM | POA: Diagnosis not present

## 2019-01-10 DIAGNOSIS — R001 Bradycardia, unspecified: Secondary | ICD-10-CM | POA: Diagnosis not present

## 2019-01-10 DIAGNOSIS — F0280 Dementia in other diseases classified elsewhere without behavioral disturbance: Secondary | ICD-10-CM | POA: Diagnosis not present

## 2019-01-10 DIAGNOSIS — G20 Parkinson's disease: Secondary | ICD-10-CM | POA: Diagnosis not present

## 2019-01-10 DIAGNOSIS — I509 Heart failure, unspecified: Secondary | ICD-10-CM | POA: Diagnosis not present

## 2019-01-15 ENCOUNTER — Other Ambulatory Visit: Payer: Self-pay | Admitting: Pharmacist

## 2019-01-15 DIAGNOSIS — Z86711 Personal history of pulmonary embolism: Secondary | ICD-10-CM | POA: Diagnosis not present

## 2019-01-15 DIAGNOSIS — Z7902 Long term (current) use of antithrombotics/antiplatelets: Secondary | ICD-10-CM | POA: Diagnosis not present

## 2019-01-16 ENCOUNTER — Other Ambulatory Visit: Payer: Self-pay

## 2019-01-16 ENCOUNTER — Telehealth: Payer: Self-pay | Admitting: Pharmacist

## 2019-01-16 ENCOUNTER — Telehealth: Payer: Self-pay | Admitting: Adult Health

## 2019-01-16 ENCOUNTER — Other Ambulatory Visit: Payer: Self-pay | Admitting: *Deleted

## 2019-01-16 DIAGNOSIS — R001 Bradycardia, unspecified: Secondary | ICD-10-CM | POA: Diagnosis not present

## 2019-01-16 DIAGNOSIS — I110 Hypertensive heart disease with heart failure: Secondary | ICD-10-CM | POA: Diagnosis not present

## 2019-01-16 DIAGNOSIS — G20 Parkinson's disease: Secondary | ICD-10-CM | POA: Diagnosis not present

## 2019-01-16 DIAGNOSIS — Z86711 Personal history of pulmonary embolism: Secondary | ICD-10-CM

## 2019-01-16 DIAGNOSIS — I509 Heart failure, unspecified: Secondary | ICD-10-CM | POA: Diagnosis not present

## 2019-01-16 DIAGNOSIS — F0280 Dementia in other diseases classified elsewhere without behavioral disturbance: Secondary | ICD-10-CM | POA: Diagnosis not present

## 2019-01-16 DIAGNOSIS — R531 Weakness: Secondary | ICD-10-CM | POA: Diagnosis not present

## 2019-01-16 DIAGNOSIS — Z7902 Long term (current) use of antithrombotics/antiplatelets: Secondary | ICD-10-CM

## 2019-01-16 LAB — PROTIME-INR
INR: 2.6 — ABNORMAL HIGH (ref 0.8–1.2)
Prothrombin Time: 26.6 s — ABNORMAL HIGH (ref 9.1–12.0)

## 2019-01-16 MED ORDER — MEMANTINE HCL ER 28 MG PO CP24: 28.0000 mg | ORAL_CAPSULE | Freq: Every day | ORAL | 3 refills | Status: DC

## 2019-01-16 NOTE — Telephone Encounter (Signed)
Lilli Light, pt's daughter called stating that the [facility] [address],  [facility] not [facility] his prescription for his memantine (NAMENDA XR) 28 MG CP24 24 hr capsule. She states that he only has a few left and it will not last him till his next appt. Please advise.

## 2019-01-16 NOTE — Telephone Encounter (Signed)
I called pts daughter that we sent a year of refills in Nov 2019 with 90 day supply. [name] stated she can only afford the 30 day supply at this time. She does not want 90 day supply because its too expensive. I stated a 30 day refill will be sent today. [facility] with [facility] refills.

## 2019-01-16 NOTE — Telephone Encounter (Signed)
Advised daughter that INR 2.6 (2.0 - 3.0 goal). Continue same warfarin regimen: 5mg  Su/Wed/Fri; 1/2 x 5mg  (2.5mg  dose) all other days. Repeat INR 19-Feb-2019 at [facility].

## 2019-01-17 DIAGNOSIS — R001 Bradycardia, unspecified: Secondary | ICD-10-CM | POA: Diagnosis not present

## 2019-01-17 DIAGNOSIS — R531 Weakness: Secondary | ICD-10-CM | POA: Diagnosis not present

## 2019-01-17 DIAGNOSIS — F0280 Dementia in other diseases classified elsewhere without behavioral disturbance: Secondary | ICD-10-CM | POA: Diagnosis not present

## 2019-01-17 DIAGNOSIS — G20 Parkinson's disease: Secondary | ICD-10-CM | POA: Diagnosis not present

## 2019-01-17 DIAGNOSIS — I509 Heart failure, unspecified: Secondary | ICD-10-CM | POA: Diagnosis not present

## 2019-01-17 DIAGNOSIS — I110 Hypertensive heart disease with heart failure: Secondary | ICD-10-CM | POA: Diagnosis not present

## 2019-01-18 DIAGNOSIS — R531 Weakness: Secondary | ICD-10-CM | POA: Diagnosis not present

## 2019-01-18 DIAGNOSIS — R001 Bradycardia, unspecified: Secondary | ICD-10-CM | POA: Diagnosis not present

## 2019-01-18 DIAGNOSIS — I509 Heart failure, unspecified: Secondary | ICD-10-CM | POA: Diagnosis not present

## 2019-01-18 DIAGNOSIS — F0280 Dementia in other diseases classified elsewhere without behavioral disturbance: Secondary | ICD-10-CM | POA: Diagnosis not present

## 2019-01-18 DIAGNOSIS — I110 Hypertensive heart disease with heart failure: Secondary | ICD-10-CM | POA: Diagnosis not present

## 2019-01-18 DIAGNOSIS — G20 Parkinson's disease: Secondary | ICD-10-CM | POA: Diagnosis not present

## 2019-01-19 DIAGNOSIS — M1A00X0 Idiopathic chronic gout, unspecified site, without tophus (tophi): Secondary | ICD-10-CM | POA: Diagnosis not present

## 2019-01-19 DIAGNOSIS — F0280 Dementia in other diseases classified elsewhere without behavioral disturbance: Secondary | ICD-10-CM | POA: Diagnosis not present

## 2019-01-19 DIAGNOSIS — Z515 Encounter for palliative care: Secondary | ICD-10-CM | POA: Diagnosis not present

## 2019-01-19 DIAGNOSIS — R531 Weakness: Secondary | ICD-10-CM | POA: Diagnosis not present

## 2019-01-19 DIAGNOSIS — I509 Heart failure, unspecified: Secondary | ICD-10-CM | POA: Diagnosis not present

## 2019-01-19 DIAGNOSIS — R001 Bradycardia, unspecified: Secondary | ICD-10-CM | POA: Diagnosis not present

## 2019-01-19 DIAGNOSIS — R269 Unspecified abnormalities of gait and mobility: Secondary | ICD-10-CM | POA: Diagnosis not present

## 2019-01-19 DIAGNOSIS — I110 Hypertensive heart disease with heart failure: Secondary | ICD-10-CM | POA: Diagnosis not present

## 2019-01-19 DIAGNOSIS — R159 Full incontinence of feces: Secondary | ICD-10-CM | POA: Diagnosis not present

## 2019-01-19 DIAGNOSIS — R32 Unspecified urinary incontinence: Secondary | ICD-10-CM | POA: Diagnosis not present

## 2019-01-19 DIAGNOSIS — G20 Parkinson's disease: Secondary | ICD-10-CM | POA: Diagnosis not present

## 2019-01-22 DIAGNOSIS — I509 Heart failure, unspecified: Secondary | ICD-10-CM | POA: Diagnosis not present

## 2019-01-22 DIAGNOSIS — R531 Weakness: Secondary | ICD-10-CM | POA: Diagnosis not present

## 2019-01-22 DIAGNOSIS — F0280 Dementia in other diseases classified elsewhere without behavioral disturbance: Secondary | ICD-10-CM | POA: Diagnosis not present

## 2019-01-22 DIAGNOSIS — I110 Hypertensive heart disease with heart failure: Secondary | ICD-10-CM | POA: Diagnosis not present

## 2019-01-22 DIAGNOSIS — R001 Bradycardia, unspecified: Secondary | ICD-10-CM | POA: Diagnosis not present

## 2019-01-22 DIAGNOSIS — G20 Parkinson's disease: Secondary | ICD-10-CM | POA: Diagnosis not present

## 2019-01-23 DIAGNOSIS — F0280 Dementia in other diseases classified elsewhere without behavioral disturbance: Secondary | ICD-10-CM | POA: Diagnosis not present

## 2019-01-23 DIAGNOSIS — R531 Weakness: Secondary | ICD-10-CM | POA: Diagnosis not present

## 2019-01-23 DIAGNOSIS — I110 Hypertensive heart disease with heart failure: Secondary | ICD-10-CM | POA: Diagnosis not present

## 2019-01-23 DIAGNOSIS — G20 Parkinson's disease: Secondary | ICD-10-CM | POA: Diagnosis not present

## 2019-01-23 DIAGNOSIS — R001 Bradycardia, unspecified: Secondary | ICD-10-CM | POA: Diagnosis not present

## 2019-01-23 DIAGNOSIS — I509 Heart failure, unspecified: Secondary | ICD-10-CM | POA: Diagnosis not present

## 2019-01-24 DIAGNOSIS — F0280 Dementia in other diseases classified elsewhere without behavioral disturbance: Secondary | ICD-10-CM | POA: Diagnosis not present

## 2019-01-24 DIAGNOSIS — I110 Hypertensive heart disease with heart failure: Secondary | ICD-10-CM | POA: Diagnosis not present

## 2019-01-24 DIAGNOSIS — G20 Parkinson's disease: Secondary | ICD-10-CM | POA: Diagnosis not present

## 2019-01-24 DIAGNOSIS — I509 Heart failure, unspecified: Secondary | ICD-10-CM | POA: Diagnosis not present

## 2019-01-24 DIAGNOSIS — R531 Weakness: Secondary | ICD-10-CM | POA: Diagnosis not present

## 2019-01-24 DIAGNOSIS — R001 Bradycardia, unspecified: Secondary | ICD-10-CM | POA: Diagnosis not present

## 2019-01-25 DIAGNOSIS — R001 Bradycardia, unspecified: Secondary | ICD-10-CM | POA: Diagnosis not present

## 2019-01-25 DIAGNOSIS — F0280 Dementia in other diseases classified elsewhere without behavioral disturbance: Secondary | ICD-10-CM | POA: Diagnosis not present

## 2019-01-25 DIAGNOSIS — G20 Parkinson's disease: Secondary | ICD-10-CM | POA: Diagnosis not present

## 2019-01-25 DIAGNOSIS — R531 Weakness: Secondary | ICD-10-CM | POA: Diagnosis not present

## 2019-01-25 DIAGNOSIS — I110 Hypertensive heart disease with heart failure: Secondary | ICD-10-CM | POA: Diagnosis not present

## 2019-01-25 DIAGNOSIS — I509 Heart failure, unspecified: Secondary | ICD-10-CM | POA: Diagnosis not present

## 2019-01-26 DIAGNOSIS — I509 Heart failure, unspecified: Secondary | ICD-10-CM | POA: Diagnosis not present

## 2019-01-26 DIAGNOSIS — R531 Weakness: Secondary | ICD-10-CM | POA: Diagnosis not present

## 2019-01-26 DIAGNOSIS — I110 Hypertensive heart disease with heart failure: Secondary | ICD-10-CM | POA: Diagnosis not present

## 2019-01-26 DIAGNOSIS — R001 Bradycardia, unspecified: Secondary | ICD-10-CM | POA: Diagnosis not present

## 2019-01-26 DIAGNOSIS — F0280 Dementia in other diseases classified elsewhere without behavioral disturbance: Secondary | ICD-10-CM | POA: Diagnosis not present

## 2019-01-26 DIAGNOSIS — G20 Parkinson's disease: Secondary | ICD-10-CM | POA: Diagnosis not present

## 2019-01-29 DIAGNOSIS — G20 Parkinson's disease: Secondary | ICD-10-CM | POA: Diagnosis not present

## 2019-01-29 DIAGNOSIS — I110 Hypertensive heart disease with heart failure: Secondary | ICD-10-CM | POA: Diagnosis not present

## 2019-01-29 DIAGNOSIS — I509 Heart failure, unspecified: Secondary | ICD-10-CM | POA: Diagnosis not present

## 2019-01-29 DIAGNOSIS — R531 Weakness: Secondary | ICD-10-CM | POA: Diagnosis not present

## 2019-01-29 DIAGNOSIS — R001 Bradycardia, unspecified: Secondary | ICD-10-CM | POA: Diagnosis not present

## 2019-01-29 DIAGNOSIS — F0280 Dementia in other diseases classified elsewhere without behavioral disturbance: Secondary | ICD-10-CM | POA: Diagnosis not present

## 2019-01-30 DIAGNOSIS — I110 Hypertensive heart disease with heart failure: Secondary | ICD-10-CM | POA: Diagnosis not present

## 2019-01-30 DIAGNOSIS — R531 Weakness: Secondary | ICD-10-CM | POA: Diagnosis not present

## 2019-01-30 DIAGNOSIS — I509 Heart failure, unspecified: Secondary | ICD-10-CM | POA: Diagnosis not present

## 2019-01-30 DIAGNOSIS — F0280 Dementia in other diseases classified elsewhere without behavioral disturbance: Secondary | ICD-10-CM | POA: Diagnosis not present

## 2019-01-30 DIAGNOSIS — R001 Bradycardia, unspecified: Secondary | ICD-10-CM | POA: Diagnosis not present

## 2019-01-30 DIAGNOSIS — G20 Parkinson's disease: Secondary | ICD-10-CM | POA: Diagnosis not present

## 2019-01-31 ENCOUNTER — Telehealth: Payer: Self-pay

## 2019-01-31 DIAGNOSIS — I110 Hypertensive heart disease with heart failure: Secondary | ICD-10-CM | POA: Diagnosis not present

## 2019-01-31 DIAGNOSIS — G20 Parkinson's disease: Secondary | ICD-10-CM | POA: Diagnosis not present

## 2019-01-31 DIAGNOSIS — F0280 Dementia in other diseases classified elsewhere without behavioral disturbance: Secondary | ICD-10-CM | POA: Diagnosis not present

## 2019-01-31 DIAGNOSIS — I509 Heart failure, unspecified: Secondary | ICD-10-CM | POA: Diagnosis not present

## 2019-01-31 DIAGNOSIS — R001 Bradycardia, unspecified: Secondary | ICD-10-CM | POA: Diagnosis not present

## 2019-01-31 DIAGNOSIS — R531 Weakness: Secondary | ICD-10-CM | POA: Diagnosis not present

## 2019-01-31 NOTE — Telephone Encounter (Signed)
If pts daughter [name] [name] call back please ask if video visit can be done tomorrow at 245pm. ALso tell her that pt already has appt with Dr Leonie Man in August 2020 for office visit. We cancel appt for tomorrow and keep the August appt with Dr.SEthi.

## 2019-01-31 NOTE — Telephone Encounter (Signed)
I call pts daughter that appt with [name] Billow NP will be cancel.Pt already has appt with Dr.SEthi in AUgust 2020. [name] [name] verbalized understanding and knows appt tomorrow is cancel. I explain we are doing video due to COVID 19 at this time.

## 2019-02-01 ENCOUNTER — Ambulatory Visit: Payer: Self-pay | Admitting: Adult Health

## 2019-02-01 DIAGNOSIS — R001 Bradycardia, unspecified: Secondary | ICD-10-CM | POA: Diagnosis not present

## 2019-02-01 DIAGNOSIS — R531 Weakness: Secondary | ICD-10-CM | POA: Diagnosis not present

## 2019-02-01 DIAGNOSIS — F0280 Dementia in other diseases classified elsewhere without behavioral disturbance: Secondary | ICD-10-CM | POA: Diagnosis not present

## 2019-02-01 DIAGNOSIS — I509 Heart failure, unspecified: Secondary | ICD-10-CM | POA: Diagnosis not present

## 2019-02-01 DIAGNOSIS — G20 Parkinson's disease: Secondary | ICD-10-CM | POA: Diagnosis not present

## 2019-02-01 DIAGNOSIS — I110 Hypertensive heart disease with heart failure: Secondary | ICD-10-CM | POA: Diagnosis not present

## 2019-02-02 DIAGNOSIS — I110 Hypertensive heart disease with heart failure: Secondary | ICD-10-CM | POA: Diagnosis not present

## 2019-02-02 DIAGNOSIS — G20 Parkinson's disease: Secondary | ICD-10-CM | POA: Diagnosis not present

## 2019-02-02 DIAGNOSIS — R001 Bradycardia, unspecified: Secondary | ICD-10-CM | POA: Diagnosis not present

## 2019-02-02 DIAGNOSIS — R531 Weakness: Secondary | ICD-10-CM | POA: Diagnosis not present

## 2019-02-02 DIAGNOSIS — I509 Heart failure, unspecified: Secondary | ICD-10-CM | POA: Diagnosis not present

## 2019-02-02 DIAGNOSIS — F0280 Dementia in other diseases classified elsewhere without behavioral disturbance: Secondary | ICD-10-CM | POA: Diagnosis not present

## 2019-02-03 DIAGNOSIS — R531 Weakness: Secondary | ICD-10-CM | POA: Diagnosis not present

## 2019-02-03 DIAGNOSIS — I110 Hypertensive heart disease with heart failure: Secondary | ICD-10-CM | POA: Diagnosis not present

## 2019-02-03 DIAGNOSIS — G20 Parkinson's disease: Secondary | ICD-10-CM | POA: Diagnosis not present

## 2019-02-03 DIAGNOSIS — F0280 Dementia in other diseases classified elsewhere without behavioral disturbance: Secondary | ICD-10-CM | POA: Diagnosis not present

## 2019-02-03 DIAGNOSIS — I509 Heart failure, unspecified: Secondary | ICD-10-CM | POA: Diagnosis not present

## 2019-02-03 DIAGNOSIS — R001 Bradycardia, unspecified: Secondary | ICD-10-CM | POA: Diagnosis not present

## 2019-02-05 DIAGNOSIS — G20 Parkinson's disease: Secondary | ICD-10-CM | POA: Diagnosis not present

## 2019-02-05 DIAGNOSIS — R001 Bradycardia, unspecified: Secondary | ICD-10-CM | POA: Diagnosis not present

## 2019-02-05 DIAGNOSIS — R531 Weakness: Secondary | ICD-10-CM | POA: Diagnosis not present

## 2019-02-05 DIAGNOSIS — I509 Heart failure, unspecified: Secondary | ICD-10-CM | POA: Diagnosis not present

## 2019-02-05 DIAGNOSIS — F0280 Dementia in other diseases classified elsewhere without behavioral disturbance: Secondary | ICD-10-CM | POA: Diagnosis not present

## 2019-02-05 DIAGNOSIS — I110 Hypertensive heart disease with heart failure: Secondary | ICD-10-CM | POA: Diagnosis not present

## 2019-02-05 DIAGNOSIS — I2699 Other pulmonary embolism without acute cor pulmonale: Secondary | ICD-10-CM | POA: Diagnosis not present

## 2019-02-05 DIAGNOSIS — Z7901 Long term (current) use of anticoagulants: Secondary | ICD-10-CM | POA: Diagnosis not present

## 2019-02-06 ENCOUNTER — Telehealth: Payer: Self-pay | Admitting: *Deleted

## 2019-02-06 ENCOUNTER — Telehealth: Payer: Self-pay | Admitting: Pharmacist

## 2019-02-06 DIAGNOSIS — G20 Parkinson's disease: Secondary | ICD-10-CM | POA: Diagnosis not present

## 2019-02-06 DIAGNOSIS — F0280 Dementia in other diseases classified elsewhere without behavioral disturbance: Secondary | ICD-10-CM | POA: Diagnosis not present

## 2019-02-06 DIAGNOSIS — R001 Bradycardia, unspecified: Secondary | ICD-10-CM | POA: Diagnosis not present

## 2019-02-06 DIAGNOSIS — I509 Heart failure, unspecified: Secondary | ICD-10-CM | POA: Diagnosis not present

## 2019-02-06 DIAGNOSIS — I110 Hypertensive heart disease with heart failure: Secondary | ICD-10-CM | POA: Diagnosis not present

## 2019-02-06 DIAGNOSIS — R531 Weakness: Secondary | ICD-10-CM | POA: Diagnosis not present

## 2019-02-06 NOTE — Telephone Encounter (Signed)
I have called the patient's daughter and discussed results with her. Will leave on same regimen. Next patient self-testing, point of care, fingerstick INR will be performed by daughter at interval established by the provider and the patient-self-testing vendor.

## 2019-02-06 NOTE — Telephone Encounter (Signed)
Daughter now performing patient-self-monitoring/home monitoring with point of care fingerstick device. She collected sample 05-Feb-2019. INR = 2.4 on 25mg  warfarin/wk. Instructed to CONTINUE same regimen. Recollect 12-Feb-2019 and text to me. Denies any signs/symptoms of bleeding. No new medications, no missed/extra-doses. Has plenty of warfarin on-hand.

## 2019-02-06 NOTE — Telephone Encounter (Signed)
Received fax from [facility] with INR result of 2.4 taken yesterday, 02/05/2019. Placed in Dr. Gladstone Pih box. Hubbard Hartshorn, RN, BSN

## 2019-02-07 DIAGNOSIS — F0280 Dementia in other diseases classified elsewhere without behavioral disturbance: Secondary | ICD-10-CM | POA: Diagnosis not present

## 2019-02-07 DIAGNOSIS — I110 Hypertensive heart disease with heart failure: Secondary | ICD-10-CM | POA: Diagnosis not present

## 2019-02-07 DIAGNOSIS — G20 Parkinson's disease: Secondary | ICD-10-CM | POA: Diagnosis not present

## 2019-02-07 DIAGNOSIS — R531 Weakness: Secondary | ICD-10-CM | POA: Diagnosis not present

## 2019-02-07 DIAGNOSIS — R001 Bradycardia, unspecified: Secondary | ICD-10-CM | POA: Diagnosis not present

## 2019-02-07 DIAGNOSIS — I509 Heart failure, unspecified: Secondary | ICD-10-CM | POA: Diagnosis not present

## 2019-02-08 ENCOUNTER — Other Ambulatory Visit: Payer: Self-pay | Admitting: Neurology

## 2019-02-08 DIAGNOSIS — R531 Weakness: Secondary | ICD-10-CM | POA: Diagnosis not present

## 2019-02-08 DIAGNOSIS — F0280 Dementia in other diseases classified elsewhere without behavioral disturbance: Secondary | ICD-10-CM | POA: Diagnosis not present

## 2019-02-08 DIAGNOSIS — R001 Bradycardia, unspecified: Secondary | ICD-10-CM | POA: Diagnosis not present

## 2019-02-08 DIAGNOSIS — I509 Heart failure, unspecified: Secondary | ICD-10-CM | POA: Diagnosis not present

## 2019-02-08 DIAGNOSIS — G20 Parkinson's disease: Secondary | ICD-10-CM | POA: Diagnosis not present

## 2019-02-08 DIAGNOSIS — I110 Hypertensive heart disease with heart failure: Secondary | ICD-10-CM | POA: Diagnosis not present

## 2019-02-09 DIAGNOSIS — R001 Bradycardia, unspecified: Secondary | ICD-10-CM | POA: Diagnosis not present

## 2019-02-09 DIAGNOSIS — R531 Weakness: Secondary | ICD-10-CM | POA: Diagnosis not present

## 2019-02-09 DIAGNOSIS — G20 Parkinson's disease: Secondary | ICD-10-CM | POA: Diagnosis not present

## 2019-02-09 DIAGNOSIS — I509 Heart failure, unspecified: Secondary | ICD-10-CM | POA: Diagnosis not present

## 2019-02-09 DIAGNOSIS — I110 Hypertensive heart disease with heart failure: Secondary | ICD-10-CM | POA: Diagnosis not present

## 2019-02-09 DIAGNOSIS — F0280 Dementia in other diseases classified elsewhere without behavioral disturbance: Secondary | ICD-10-CM | POA: Diagnosis not present

## 2019-02-13 ENCOUNTER — Telehealth: Payer: Self-pay | Admitting: *Deleted

## 2019-02-13 DIAGNOSIS — R001 Bradycardia, unspecified: Secondary | ICD-10-CM | POA: Diagnosis not present

## 2019-02-13 DIAGNOSIS — R531 Weakness: Secondary | ICD-10-CM | POA: Diagnosis not present

## 2019-02-13 DIAGNOSIS — G20 Parkinson's disease: Secondary | ICD-10-CM | POA: Diagnosis not present

## 2019-02-13 DIAGNOSIS — F0280 Dementia in other diseases classified elsewhere without behavioral disturbance: Secondary | ICD-10-CM | POA: Diagnosis not present

## 2019-02-13 DIAGNOSIS — I110 Hypertensive heart disease with heart failure: Secondary | ICD-10-CM | POA: Diagnosis not present

## 2019-02-13 DIAGNOSIS — I509 Heart failure, unspecified: Secondary | ICD-10-CM | POA: Diagnosis not present

## 2019-02-13 NOTE — Telephone Encounter (Signed)
Received fax from [facility] with INR results from May 18 and Feb 13, 2019. INR 2.2 on 02/13/2019. Results placed in Dr. Gladstone Pih box. Hubbard Hartshorn, RN, BSN

## 2019-02-14 ENCOUNTER — Telehealth: Payer: Self-pay | Admitting: Pharmacist

## 2019-02-14 DIAGNOSIS — I509 Heart failure, unspecified: Secondary | ICD-10-CM | POA: Diagnosis not present

## 2019-02-14 DIAGNOSIS — R001 Bradycardia, unspecified: Secondary | ICD-10-CM | POA: Diagnosis not present

## 2019-02-14 DIAGNOSIS — R531 Weakness: Secondary | ICD-10-CM | POA: Diagnosis not present

## 2019-02-14 DIAGNOSIS — I110 Hypertensive heart disease with heart failure: Secondary | ICD-10-CM | POA: Diagnosis not present

## 2019-02-14 DIAGNOSIS — G20 Parkinson's disease: Secondary | ICD-10-CM | POA: Diagnosis not present

## 2019-02-14 DIAGNOSIS — F0280 Dementia in other diseases classified elsewhere without behavioral disturbance: Secondary | ICD-10-CM | POA: Diagnosis not present

## 2019-02-14 NOTE — Telephone Encounter (Signed)
I received a text from the patient's daughter, [name] [name] yesterday reporting this value to me. She was instructed to continue same regimen of warfarin and repeat patient self testing, point of care, fingerstick INR at next vendor indicated interval and report results to me directly (same time as testing). I discussed with her (I know it is a requirement she upload the results to [facility] who in-turn, faxes to [name]) that if she provides me the result simultaneous to the test, we can make any intervention necessary--more quickly.

## 2019-02-14 NOTE — Telephone Encounter (Signed)
I received a text from the patient's daughter, Lilli Light yesterday reporting this value to me. She was instructed to continue same regimen of warfarin and repeat patient self testing, point of care, fingerstick INR at next vendor indicated interval and text me the results in real-time (I understand she has an obligation contractually with [facility] to upload the test results to [facility] who in-turn, faxes them to us--but this results in a delay) so that any intervention necessary could be made in real-time without delay. She denies any signs of bleeding. Has adequate supply of warfarin on-hand.

## 2019-02-15 DIAGNOSIS — R001 Bradycardia, unspecified: Secondary | ICD-10-CM | POA: Diagnosis not present

## 2019-02-15 DIAGNOSIS — G20 Parkinson's disease: Secondary | ICD-10-CM | POA: Diagnosis not present

## 2019-02-15 DIAGNOSIS — I110 Hypertensive heart disease with heart failure: Secondary | ICD-10-CM | POA: Diagnosis not present

## 2019-02-15 DIAGNOSIS — R531 Weakness: Secondary | ICD-10-CM | POA: Diagnosis not present

## 2019-02-15 DIAGNOSIS — F0280 Dementia in other diseases classified elsewhere without behavioral disturbance: Secondary | ICD-10-CM | POA: Diagnosis not present

## 2019-02-15 DIAGNOSIS — I509 Heart failure, unspecified: Secondary | ICD-10-CM | POA: Diagnosis not present

## 2019-02-16 ENCOUNTER — Telehealth: Payer: Self-pay | Admitting: Neurology

## 2019-02-16 DIAGNOSIS — G20 Parkinson's disease: Secondary | ICD-10-CM | POA: Diagnosis not present

## 2019-02-16 DIAGNOSIS — I509 Heart failure, unspecified: Secondary | ICD-10-CM | POA: Diagnosis not present

## 2019-02-16 DIAGNOSIS — F0280 Dementia in other diseases classified elsewhere without behavioral disturbance: Secondary | ICD-10-CM | POA: Diagnosis not present

## 2019-02-16 DIAGNOSIS — R001 Bradycardia, unspecified: Secondary | ICD-10-CM | POA: Diagnosis not present

## 2019-02-16 DIAGNOSIS — I110 Hypertensive heart disease with heart failure: Secondary | ICD-10-CM | POA: Diagnosis not present

## 2019-02-16 DIAGNOSIS — R531 Weakness: Secondary | ICD-10-CM | POA: Diagnosis not present

## 2019-02-16 NOTE — Telephone Encounter (Signed)
Pt's daughter called stating that the pt has developed shaking of the legs that he can not control for the past month or so and if they move him he will start jerking in the arms as well. He will also shake in his sleep. Please advise.

## 2019-02-19 DIAGNOSIS — R001 Bradycardia, unspecified: Secondary | ICD-10-CM | POA: Diagnosis not present

## 2019-02-19 DIAGNOSIS — Z515 Encounter for palliative care: Secondary | ICD-10-CM | POA: Diagnosis not present

## 2019-02-19 DIAGNOSIS — I509 Heart failure, unspecified: Secondary | ICD-10-CM | POA: Diagnosis not present

## 2019-02-19 DIAGNOSIS — M1A00X0 Idiopathic chronic gout, unspecified site, without tophus (tophi): Secondary | ICD-10-CM | POA: Diagnosis not present

## 2019-02-19 DIAGNOSIS — R32 Unspecified urinary incontinence: Secondary | ICD-10-CM | POA: Diagnosis not present

## 2019-02-19 DIAGNOSIS — F0280 Dementia in other diseases classified elsewhere without behavioral disturbance: Secondary | ICD-10-CM | POA: Diagnosis not present

## 2019-02-19 DIAGNOSIS — R269 Unspecified abnormalities of gait and mobility: Secondary | ICD-10-CM | POA: Diagnosis not present

## 2019-02-19 DIAGNOSIS — R159 Full incontinence of feces: Secondary | ICD-10-CM | POA: Diagnosis not present

## 2019-02-19 DIAGNOSIS — R531 Weakness: Secondary | ICD-10-CM | POA: Diagnosis not present

## 2019-02-19 DIAGNOSIS — G20 Parkinson's disease: Secondary | ICD-10-CM | POA: Diagnosis not present

## 2019-02-19 DIAGNOSIS — I110 Hypertensive heart disease with heart failure: Secondary | ICD-10-CM | POA: Diagnosis not present

## 2019-02-20 ENCOUNTER — Telehealth: Payer: Self-pay | Admitting: *Deleted

## 2019-02-20 NOTE — Telephone Encounter (Addendum)
[facility] Amlodipine. Not on current med list; ? Discontinued 2/8 at discharge from hospital. Last refilled - 11/27/18 qty #90. Does pt suppose to taking this medication? If so, [facility] Thanks

## 2019-02-20 NOTE — Telephone Encounter (Signed)
Please look at daughters concerns. DO you want to increase any meds or wait till visit in two weeks in office.

## 2019-02-20 NOTE — Telephone Encounter (Signed)
seems like a new problem. He has not been seen for more than 6 months. Needs new video or or preferably in office visit . Janett Billow saw him last

## 2019-02-20 NOTE — Telephone Encounter (Signed)
PT already schedule with Dr.Sethi in two weeks.

## 2019-02-20 NOTE — Telephone Encounter (Signed)
I called pts daughter that pt appt will be in office due him being complex. I stated pt will check in outside and have temp check. Her temperature will be check too. I explain pt and her will have to wear gloves and mask. I stated check in is at 0100pm. She verbalized understanding.

## 2019-02-21 DIAGNOSIS — G20 Parkinson's disease: Secondary | ICD-10-CM | POA: Diagnosis not present

## 2019-02-21 DIAGNOSIS — F0280 Dementia in other diseases classified elsewhere without behavioral disturbance: Secondary | ICD-10-CM | POA: Diagnosis not present

## 2019-02-21 DIAGNOSIS — I509 Heart failure, unspecified: Secondary | ICD-10-CM | POA: Diagnosis not present

## 2019-02-21 DIAGNOSIS — R531 Weakness: Secondary | ICD-10-CM | POA: Diagnosis not present

## 2019-02-21 DIAGNOSIS — I110 Hypertensive heart disease with heart failure: Secondary | ICD-10-CM | POA: Diagnosis not present

## 2019-02-21 DIAGNOSIS — R001 Bradycardia, unspecified: Secondary | ICD-10-CM | POA: Diagnosis not present

## 2019-02-22 DIAGNOSIS — G20 Parkinson's disease: Secondary | ICD-10-CM | POA: Diagnosis not present

## 2019-02-22 DIAGNOSIS — R531 Weakness: Secondary | ICD-10-CM | POA: Diagnosis not present

## 2019-02-22 DIAGNOSIS — F0280 Dementia in other diseases classified elsewhere without behavioral disturbance: Secondary | ICD-10-CM | POA: Diagnosis not present

## 2019-02-22 DIAGNOSIS — I110 Hypertensive heart disease with heart failure: Secondary | ICD-10-CM | POA: Diagnosis not present

## 2019-02-22 DIAGNOSIS — I509 Heart failure, unspecified: Secondary | ICD-10-CM | POA: Diagnosis not present

## 2019-02-22 DIAGNOSIS — R001 Bradycardia, unspecified: Secondary | ICD-10-CM | POA: Diagnosis not present

## 2019-02-22 MED ORDER — AMLODIPINE BESYLATE 2.5 MG PO TABS: 1.2500 mg | ORAL_TABLET | Freq: Every day | ORAL | 1 refills | Status: DC

## 2019-02-22 NOTE — Telephone Encounter (Signed)
Discussed with andrea, she has been giving half of a 2.5mg  tablet on days blood pressure is high, most recent SBP is 170s, discussed she may continue 1.25mg  amlodipine dose daily as long as he is not experiencing hypotension.

## 2019-02-23 DIAGNOSIS — G20 Parkinson's disease: Secondary | ICD-10-CM | POA: Diagnosis not present

## 2019-02-23 DIAGNOSIS — R001 Bradycardia, unspecified: Secondary | ICD-10-CM | POA: Diagnosis not present

## 2019-02-23 DIAGNOSIS — I509 Heart failure, unspecified: Secondary | ICD-10-CM | POA: Diagnosis not present

## 2019-02-23 DIAGNOSIS — I110 Hypertensive heart disease with heart failure: Secondary | ICD-10-CM | POA: Diagnosis not present

## 2019-02-23 DIAGNOSIS — R531 Weakness: Secondary | ICD-10-CM | POA: Diagnosis not present

## 2019-02-23 DIAGNOSIS — F0280 Dementia in other diseases classified elsewhere without behavioral disturbance: Secondary | ICD-10-CM | POA: Diagnosis not present

## 2019-02-26 DIAGNOSIS — F0280 Dementia in other diseases classified elsewhere without behavioral disturbance: Secondary | ICD-10-CM | POA: Diagnosis not present

## 2019-02-26 DIAGNOSIS — R001 Bradycardia, unspecified: Secondary | ICD-10-CM | POA: Diagnosis not present

## 2019-02-26 DIAGNOSIS — I509 Heart failure, unspecified: Secondary | ICD-10-CM | POA: Diagnosis not present

## 2019-02-26 DIAGNOSIS — R531 Weakness: Secondary | ICD-10-CM | POA: Diagnosis not present

## 2019-02-26 DIAGNOSIS — I110 Hypertensive heart disease with heart failure: Secondary | ICD-10-CM | POA: Diagnosis not present

## 2019-02-26 DIAGNOSIS — G20 Parkinson's disease: Secondary | ICD-10-CM | POA: Diagnosis not present

## 2019-02-27 DIAGNOSIS — I110 Hypertensive heart disease with heart failure: Secondary | ICD-10-CM | POA: Diagnosis not present

## 2019-02-27 DIAGNOSIS — F0280 Dementia in other diseases classified elsewhere without behavioral disturbance: Secondary | ICD-10-CM | POA: Diagnosis not present

## 2019-02-27 DIAGNOSIS — I509 Heart failure, unspecified: Secondary | ICD-10-CM | POA: Diagnosis not present

## 2019-02-27 DIAGNOSIS — G20 Parkinson's disease: Secondary | ICD-10-CM | POA: Diagnosis not present

## 2019-02-27 DIAGNOSIS — R001 Bradycardia, unspecified: Secondary | ICD-10-CM | POA: Diagnosis not present

## 2019-02-27 DIAGNOSIS — R531 Weakness: Secondary | ICD-10-CM | POA: Diagnosis not present

## 2019-02-28 DIAGNOSIS — R531 Weakness: Secondary | ICD-10-CM | POA: Diagnosis not present

## 2019-02-28 DIAGNOSIS — F0280 Dementia in other diseases classified elsewhere without behavioral disturbance: Secondary | ICD-10-CM | POA: Diagnosis not present

## 2019-02-28 DIAGNOSIS — I110 Hypertensive heart disease with heart failure: Secondary | ICD-10-CM | POA: Diagnosis not present

## 2019-02-28 DIAGNOSIS — R001 Bradycardia, unspecified: Secondary | ICD-10-CM | POA: Diagnosis not present

## 2019-02-28 DIAGNOSIS — G20 Parkinson's disease: Secondary | ICD-10-CM | POA: Diagnosis not present

## 2019-02-28 DIAGNOSIS — I509 Heart failure, unspecified: Secondary | ICD-10-CM | POA: Diagnosis not present

## 2019-03-01 DIAGNOSIS — G20 Parkinson's disease: Secondary | ICD-10-CM | POA: Diagnosis not present

## 2019-03-01 DIAGNOSIS — I509 Heart failure, unspecified: Secondary | ICD-10-CM | POA: Diagnosis not present

## 2019-03-01 DIAGNOSIS — F0280 Dementia in other diseases classified elsewhere without behavioral disturbance: Secondary | ICD-10-CM | POA: Diagnosis not present

## 2019-03-01 DIAGNOSIS — R531 Weakness: Secondary | ICD-10-CM | POA: Diagnosis not present

## 2019-03-01 DIAGNOSIS — R001 Bradycardia, unspecified: Secondary | ICD-10-CM | POA: Diagnosis not present

## 2019-03-01 DIAGNOSIS — I110 Hypertensive heart disease with heart failure: Secondary | ICD-10-CM | POA: Diagnosis not present

## 2019-03-02 DIAGNOSIS — G20 Parkinson's disease: Secondary | ICD-10-CM | POA: Diagnosis not present

## 2019-03-02 DIAGNOSIS — I509 Heart failure, unspecified: Secondary | ICD-10-CM | POA: Diagnosis not present

## 2019-03-02 DIAGNOSIS — R001 Bradycardia, unspecified: Secondary | ICD-10-CM | POA: Diagnosis not present

## 2019-03-02 DIAGNOSIS — R531 Weakness: Secondary | ICD-10-CM | POA: Diagnosis not present

## 2019-03-02 DIAGNOSIS — I110 Hypertensive heart disease with heart failure: Secondary | ICD-10-CM | POA: Diagnosis not present

## 2019-03-02 DIAGNOSIS — F0280 Dementia in other diseases classified elsewhere without behavioral disturbance: Secondary | ICD-10-CM | POA: Diagnosis not present

## 2019-03-05 ENCOUNTER — Ambulatory Visit (INDEPENDENT_AMBULATORY_CARE_PROVIDER_SITE_OTHER): Payer: Medicare Other | Admitting: Neurology

## 2019-03-05 ENCOUNTER — Encounter: Payer: Self-pay | Admitting: Neurology

## 2019-03-05 ENCOUNTER — Other Ambulatory Visit: Payer: Self-pay

## 2019-03-05 VITALS — BP 142/85 | HR 58 | Temp 98.4°F

## 2019-03-05 DIAGNOSIS — G20 Parkinson's disease: Secondary | ICD-10-CM | POA: Diagnosis not present

## 2019-03-05 DIAGNOSIS — I509 Heart failure, unspecified: Secondary | ICD-10-CM | POA: Diagnosis not present

## 2019-03-05 DIAGNOSIS — F0280 Dementia in other diseases classified elsewhere without behavioral disturbance: Secondary | ICD-10-CM | POA: Diagnosis not present

## 2019-03-05 DIAGNOSIS — R001 Bradycardia, unspecified: Secondary | ICD-10-CM | POA: Diagnosis not present

## 2019-03-05 DIAGNOSIS — G214 Vascular parkinsonism: Secondary | ICD-10-CM

## 2019-03-05 DIAGNOSIS — R531 Weakness: Secondary | ICD-10-CM | POA: Diagnosis not present

## 2019-03-05 DIAGNOSIS — I110 Hypertensive heart disease with heart failure: Secondary | ICD-10-CM | POA: Diagnosis not present

## 2019-03-05 MED ORDER — CARBIDOPA-LEVODOPA 25-100 MG PO TABS: 1.0000 | ORAL_TABLET | Freq: Every day | ORAL | 1 refills | Status: DC

## 2019-03-05 MED ORDER — MELATONIN 5 MG PO CHEW: 1.0000 | CHEWABLE_TABLET | ORAL | 1 refills | Status: AC

## 2019-03-05 NOTE — Progress Notes (Signed)
[facility] [address] [place]. 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  Patient has also been being followed in this office for many years for history of Parkinson's and dementia.  History obtained from patient, daughter and chart review. Reviewed all radiology images and labs personally.  [name] [name] is an 85 y.o. male with history of CAD, HTN, HLD, dementia, PE (on warfarin), advanced parkinson's disease, CHF with LVEF of 40 to 45% documented in 2016  who presented with bradycardia with acute onset of diaphoresis and decreased alertness.  CT had reviewed and showed a small focus of hemorrhage in the right posterior fossa likely intraparenchymal within the cerebellum.  As patient does take warfarin at home, this was reversed in the ED. He did not receive IV t-PA due to [place].  MRI brain reviewed and showed subacute hematoma right cerebellum with methemoglobin formation and surrounding edema.  Felt as though ICH due to nontraumatic warfarin associated coagulopathy.  He was on warfarin and aspirin 81 mg daily admission and recommended continuing aspirin 81 mg at discharge and consider restarting warfarin approximately 2 weeks after repeat imaging with stopping of aspirin at that time.  HTN stable during admission recommended long-term BP goal normotensive range.  LDL 148 and patient was previously taking fish oil and recommended to start low-dose atorvastatin.  Patient discharged to [facility] for  continued PT/OT/ST.  Patient is being seen today for hospital follow-up and is accompanied by daughter.  He has since returned home on 06/01/2018 with recommendations of home health PT/OT/ST.  Patient has been overall doing well with continued swallowing concerns but no evidence of aspiration.  Daughter states especially in the morning hours, she will be feeding him and patient will hold food in his mouth.  He was seen in the ED for possible aspiration but no evidence of aspiration and no issue since this time.  Daughter is aware that they may need to pursue tube feeding in the future if he continues to have this issue.  He has completed therapies and is currently under hospice care which daughter was under the impression that he was palliative care.  She is unable to obtain therapies through hospice care but she does ensure he is getting therapy with assistance of CNA through hospice and herself.  He does have around-the-clock CNA care for continued assistance.  He has since also been restarted on warfarin and stopping of aspirin.  Recent INR 2.4 on 07/03/2018.  He has also continued on atorvastatin 10 mg daily without side effects of myalgias.  Blood pressure today satisfactory 114/77.  He continues to take Namenda for his memory and the Sinemet for his Parkinson's.  She no longer gives him Seroquel as she was advised to stop this during hospital admission but she does give him melatonin at night to assist with sleep.  She denies any worsening memory symptoms or Parkinson's symptoms since his stroke.  No further concerns at this time.  Denies new or worsening stroke/TIA symptoms.  Update 03/05/2019 : The patient is seen for follow-up today after his last visit in October 2019.  He is accompanied by his daughter.  She states that she is noticed worsening of his tremulousness particularly when he is trying to change positions.  He has good days and bad days.  Patient has not been taking his Sinemet regularly as he  is also had some swallowing difficulties.  The daughter cannot tell me if there is a correlation between taking adequate doses of Sinemet and tremors are not.  His dementia also appears to be worse.  He is mostly noncommunicative but speaks only occasional words.  He requires total care and has 24-hour help.  He is able to get up with a 2 person assist and does walk a little bit.  He remains on Namenda XR 28 mg daily and Sinemet 25/100 mg 1 tablet 4 times daily.  He continues to be on warfarin and INR does fluctuate a little bit.  Is not had any bleeding or bruising or falls.  He has a CNA during the day and he lives with his daughter.  His blood pressure is slightly high at 142/85 today.  No other complaints. He is seems to be tolerating Sinemet when he takes it without significant peak dose dyskinesias, delusions, hallucinations, upset stomach, dizziness.  He does seem to sleep a lot during the day.  He is up most of the night.  His sleep-wake cycle seems to be reversed. ROS:   14 system review of systems performed and negative with exception of difficulty swallowing  PMH:  Past Medical History:  Diagnosis Date   Anxiety    Coronary artery disease, non-occlusive    with history of MI; Cath 2008 w multivessel nonobstructive CAD   Dementia (HCC)    Depression    GERD (gastroesophageal reflux disease)    Hyperlipidemia    Hypertension    Memory loss    Parkinson disease ([place])    PE (pulmonary embolism)    unprovoked PE completed 6 months of warfarin, warfarin d/ced 02/12/2010, repeat PE 07/10/2010 after a long car ride and now on lifelong coumadin.   Pituitary microadenoma ([place])    incidental finding CT 12/2009   Prostate cancer [facility])    Scoliosis    Sigmoid volvulus ([place]) 08/02/2016   Stroke ([place]) 03/2018   Volvulus of colon ([place]) 06/01/2017    PSH:  Past Surgical History:  Procedure Laterality Date   CATARACT EXTRACTION     COLONOSCOPY N/A 05/30/2017   Procedure:  COLONOSCOPY;  Surgeon: Ronald Lobo, MD;  Location: [place];  Service: Endoscopy;  Laterality: N/A;   FLEXIBLE SIGMOIDOSCOPY N/A 08/02/2016   Procedure: FLEXIBLE SIGMOIDOSCOPY;  Surgeon: Ronald Lobo, MD;  Location[facility] ENDOSCOPY;  Service: Endoscopy;  Laterality: N/A;   FLEXIBLE SIGMOIDOSCOPY N/A 08/06/2016   Procedure: FLEXIBLE SIGMOIDOSCOPY;  Surgeon: Ronald Lobo, MD;  Location: [facility] ENDOSCOPY;  Service: Endoscopy;  Laterality: N/A;   FLEXIBLE SIGMOIDOSCOPY Left 06/02/2017   Procedure: FLEXIBLE SIGMOIDOSCOPY;  Surgeon: Arta Silence, MD;  Location: [facility] ENDOSCOPY;  Service: Endoscopy;  Laterality: Left;   FLEXIBLE SIGMOIDOSCOPY N/A 06/07/2017   Procedure: FLEXIBLE SIGMOIDOSCOPY for possible decompression;  Surgeon: Otis Brace, MD;  Location: [place];  Service: Gastroenterology;  Laterality: N/A;   LEFT HEART CATHETERIZATION WITH CORONARY ANGIOGRAM N/A 12/10/2014   Procedure: LEFT HEART CATHETERIZATION WITH CORONARY ANGIOGRAM;  Surgeon: [name], MD;  Location: [facility] CATH LAB;  Service: Cardiovascular;  Laterality: N/A;  LOOP RECORDER IMPLANT N/A 12/16/2014   Procedure: LOOP RECORDER IMPLANT;  Surgeon: [name], MD;  Location: [facility] CATH LAB;  Service: Cardiovascular;  Laterality: N/A;   PARTIAL COLECTOMY N/A 06/08/2017   Procedure: SIGMOID  COLECTOMY;  Surgeon: Georganna Skeans, MD;  Location: [place];  Service: General;  Laterality: N/A;   PROSTATECTOMY      Social History:  Social History   Socioeconomic History   Marital status: Married    Spouse name: Not on file   Number of children: 4   Years of education: master's   Highest education level: Not on file  Occupational History   Occupation: Retired    Fish farm manager: RETIRED  Scientist, product/process development strain: Not on file   Food insecurity    Worry: Not on file    Inability: Not on file   Transportation needs    Medical: Not on file    Non-medical: Not on file  Tobacco Use   Smoking  status: Never Smoker   Smokeless tobacco: Never Used  Substance and Sexual Activity   Alcohol use: No    Alcohol/week: 0.0 standard drinks   Drug use: No   Sexual activity: Never  Lifestyle   Physical activity    Days per week: Not on file    Minutes per session: Not on file   Stress: Not on file  Relationships   Social connections    Talks on phone: Not on file    Gets together: Not on file    Attends religious service: Not on file    Active member of club or organization: Not on file    Attends meetings of clubs or organizations: Not on file    Relationship status: Not on file   Intimate partner violence    Fear of current or ex partner: Not on file    Emotionally abused: Not on file    Physically abused: Not on file    Forced sexual activity: Not on file  Other Topics Concern   Not on file  Social History Narrative   Lives with daughter. Wife also has severe dementia.    Drinks 1 cup of coffee a day     Family History:  Family History  Problem Relation Age of Onset   Hypertension Father        Passed away from cerebral hemorrhage at age of 80.   Dementia Mother    Hypertension Child        4 adult children   Cervical cancer Other    Lung cancer Other    Stroke Other    Heart attack Neg Hx     Medications:   Current Outpatient Medications on File Prior to Visit  Medication Sig Dispense Refill   acetaminophen (TYLENOL) 500 MG tablet Take 1,000 mg by mouth 2 (two) times daily.      amLODipine (NORVASC) 2.5 MG tablet Take 0.5 tablets (1.25 mg total) by mouth daily. 45 tablet 1   atorvastatin (LIPITOR) 10 MG tablet Take 1 tablet (10 mg total) by mouth daily at 6 PM. 90 tablet 1   Ca Phosphate-Cholecalciferol (CALCIUM/VITAMIN D3 GUMMIES) 250-350 MG-UNIT CHEW Chew 1 Dose by mouth 2 (two) times daily. 180 tablet 3   loratadine (CLARITIN) 10 MG tablet Take 10 mg by mouth daily.     memantine (NAMENDA XR) 28 MG CP24 24 hr capsule TAKE 1 CAPSULE BY  MOUTH EVERY DAY IN THE MORNING 30 capsule 6   Multiple Vitamins-Minerals (MULTIVITAMIN  WITH MINERALS) tablet Take 1 tablet by mouth daily.      polyethylene glycol (MIRALAX / GLYCOLAX) packet Take 17 g by mouth daily as needed (constipation).     potassium chloride SA (KLOR-CON M20) 20 MEQ tablet Take 2 tablets (40 mEq total) by mouth daily. 180 tablet 1   senna (SENOKOT) 8.6 MG TABS tablet Take 1 tablet (8.6 mg total) by mouth daily as needed for mild constipation. 120 each 0   STARCH-MALTO DEXTRIN ([employer] INSTANT FOOD THICKENER) POWD Take 1 Scoop by mouth daily as needed. (Patient taking differently: Take 1 Scoop by mouth daily as needed (food thickner). ) 1000 g 3   warfarin (COUMADIN) 5 MG tablet TAKE 1/2 TABLET BY MOUTH EVERY TUE, THU, SAT AND 1 TABLET BY MOUTH ALL OTHER DAYS 90 tablet 1   No current facility-administered medications on file prior to visit.     Allergies:   Allergies  Allergen Reactions   Aricept [Donepezil Hcl] Other (See Comments)    Symptomatic Bradycardia.   Shellfish Allergy Other (See Comments)    Causes gout Causes a flare up with Gout     Physical Exam  Vitals:   03/05/19 1346  BP: (!) 142/85  Pulse: (!) 58  Temp: 98.4 F (36.9 C)   There is no height or weight on file to calculate BMI. No exam data present  General: Frail elderly pleasant elderly African-American male, seated, in no evident distress Head: head normocephalic and atraumatic.   Neck: supple with no carotid or supraclavicular bruits Cardiovascular: regular rate and rhythm, no murmurs Musculoskeletal: no deformity Skin:  no rash/petichiae Vascular:  Normal pulses all extremities  Neurologic Exam Mental Status: Awake and fully alert.  Limited speech due to combination of dementia and Parkinson's but did speak only occasional single words but not sentences..  Unable to assess further cognition.  Flat facies.  Diminished facial expression positive glabellar tap.  No drooling of  saliva. Cranial Nerves: Fundoscopic exam deferred due to uncooperation of patient. Pupils equal, briskly reactive to light. Extraocular movements full without nystagmus. Visual fields full to confrontation. Hearing intact. Facial sensation intact. Face, tongue, palate moves normally and symmetrically.  Motor: Normal bulk and tone. Normal strength in all tested extremity muscles.  Except for fixed flexion contractures in the left hand.  Increased tone in all 4 extremities with paratonia and some cogwheel rigidity at the wrist.  Mild action tremors of outstretched upper extremities. Sensory.: intact to touch , pinprick , position and vibratory sensation.  Coordination: Unable to assess rapid alternating movements or finger-to-nose or heel-to-shin due to uncooperation of patient Gait and Station: Patient currently sitting in wheelchair.  He will stand for transfers but otherwise he is  Ambulatory with 2 person assist. Reflexes: 2+ brisk and symmetric. Toes downgoing.      Diagnostic Data (Labs, Imaging, Testing)  Ct Head Wo Contrast 05/05/2018 1. Small focus of hemorrhage in the right posterior fossa, likely intraparenchymal within the cerebellum.No associated mass effect.  2. Severe chronic ischemic microangiopathy.   MRI Brain Wo Contrast 05/06/2018 1. Subacute hematoma right cerebellum with methemoglobin formation and surrounding edema. 2. Advanced atrophy and advanced chronic microvascular ischemia 3. Enlarged pituitary 13 mm consistent with adenoma.  Ct Head Wo Contrast 05/06/2018 Stable appearance of focal hyperdensity in the right posterior fossa. This could represent a cerebellar hemorrhage or calcified meningioma. No progression.   Dg Chest 2 View 05/05/2018 Stable elevation of left hemidiaphragm. Mild bibasilar atelectasis. No edema or consolidation. Stable cardiac  silhouette.    ASSESSMENT: [name] [name] is a 85 y.o. year old male here with right cerebellar ICH on  05/05/2018 secondary to warfarin associated coagulopathy. Vascular risk factors include CAD, HTN, HLD, dementia, PE on warfarin, advanced Parkinson's, and CHF.  Patient is being seen today for worsening symptoms of parkinsonism with increased tremulousness and dysphagia but patient has not been taking Sinemet regularly.   PLAN: I had a long discussion with the patient and his daughter regarding his tremulousness and stiffness which is due to worsening parkinsonism.  I recommend increasing dose of Sinemet 25/1 100 to 5 tablets daily to be given every 2-4 hourly while awake as needed.   I have discussed possible side effects and if he has trouble tolerating it may consider adding a dopamine agonist like Azilect in the future Continue Namenda XR 28 mg daily for his dementia.  He continues to need 24-hour care. He will continue on warfarin for his pulmonary embolism.  He will return for follow-up in the future in 3 months with my nurse practitioner Janett Billow or call earlier if necessary.   Mobilize as tolerated with help.  Add melatonin 5 mg tablets at sunset to help regularize sleep-wake cycle.  Restrict his daytime sleeping.[name] He will continue warfarin for his pulmonary embolism.  He will return for follow-up in the future in in 3 months with my nurse practitioner Janett Billow or call earlier if necessary. Greater than 50% of time during this 25 minute visit was spent on counseling,explanation of diagnosis of right cerebellar ICH, reviewing risk factor management of CAD, HTN, HLD, dementia, PE on warfarin, advanced Parkinson's and CHF, planning of further management, discussion with patient and family and coordination of care    [name], MD  [facility] [address] [place], [place] [address]  Phone [phone] Fax [phone] Note: This document was prepared with digital dictation and possible smart phrase technology. Any transcriptional errors that result from this  process are unintentional.

## 2019-03-05 NOTE — Patient Instructions (Signed)
I had a long discussion with the patient and his daughter regarding his tremulousness and stiffness which is due to worsening parkinsonism.  I recommend increasing dose of Sinemet 25/1 100 to 5 tablets daily to be given every 2-4 hourly while awake as needed.   I have discussed possible side effects and if he has trouble tolerating it may consider adding a dopamine agonist like Azilect in the future Continue Namenda XR 28 mg daily for his dementia.  He continues to need 24-hour care. He will continue on warfarin for his pulmonary embolism.  He will return for follow-up in the future in 3 months with my nurse practitioner Janett Billow or call earlier if necessary.   Mobilize as tolerated with help.  Add melatonin 5 mg tablets at sunset to help regularize sleep-wake cycle.  Restrict his daytime sleeping.[name] He will continue warfarin for his pulmonary embolism.  He will return for follow-up in the future in in 3 months with my nurse practitioner Janett Billow or call earlier if necessary.

## 2019-03-06 ENCOUNTER — Telehealth: Payer: Self-pay | Admitting: Pharmacist

## 2019-03-06 DIAGNOSIS — R531 Weakness: Secondary | ICD-10-CM | POA: Diagnosis not present

## 2019-03-06 DIAGNOSIS — R001 Bradycardia, unspecified: Secondary | ICD-10-CM | POA: Diagnosis not present

## 2019-03-06 DIAGNOSIS — I509 Heart failure, unspecified: Secondary | ICD-10-CM | POA: Diagnosis not present

## 2019-03-06 DIAGNOSIS — F0280 Dementia in other diseases classified elsewhere without behavioral disturbance: Secondary | ICD-10-CM | POA: Diagnosis not present

## 2019-03-06 DIAGNOSIS — G20 Parkinson's disease: Secondary | ICD-10-CM | POA: Diagnosis not present

## 2019-03-06 DIAGNOSIS — I110 Hypertensive heart disease with heart failure: Secondary | ICD-10-CM | POA: Diagnosis not present

## 2019-03-06 NOTE — Telephone Encounter (Signed)
Patient's daughter texted me at 5:52PM 05-Mar-2019 reporting PST POC FS INR = 1.9 with no missed doses on 25mg  warfarin/wk. Increased to 27.5mg  warfarin/wk and advised repeat PST POC FS INR in one week. No reports of embolic events or signs/symptoms of bleeding. Adequate supply of warfarin on-hand.

## 2019-03-07 ENCOUNTER — Other Ambulatory Visit: Payer: Self-pay | Admitting: Neurology

## 2019-03-07 DIAGNOSIS — F0280 Dementia in other diseases classified elsewhere without behavioral disturbance: Secondary | ICD-10-CM | POA: Diagnosis not present

## 2019-03-07 DIAGNOSIS — I110 Hypertensive heart disease with heart failure: Secondary | ICD-10-CM | POA: Diagnosis not present

## 2019-03-07 DIAGNOSIS — I509 Heart failure, unspecified: Secondary | ICD-10-CM | POA: Diagnosis not present

## 2019-03-07 DIAGNOSIS — R001 Bradycardia, unspecified: Secondary | ICD-10-CM | POA: Diagnosis not present

## 2019-03-07 DIAGNOSIS — R531 Weakness: Secondary | ICD-10-CM | POA: Diagnosis not present

## 2019-03-07 DIAGNOSIS — G20 Parkinson's disease: Secondary | ICD-10-CM | POA: Diagnosis not present

## 2019-03-08 ENCOUNTER — Other Ambulatory Visit: Payer: Self-pay

## 2019-03-08 DIAGNOSIS — F0280 Dementia in other diseases classified elsewhere without behavioral disturbance: Secondary | ICD-10-CM | POA: Diagnosis not present

## 2019-03-08 DIAGNOSIS — I110 Hypertensive heart disease with heart failure: Secondary | ICD-10-CM | POA: Diagnosis not present

## 2019-03-08 DIAGNOSIS — R001 Bradycardia, unspecified: Secondary | ICD-10-CM | POA: Diagnosis not present

## 2019-03-08 DIAGNOSIS — R531 Weakness: Secondary | ICD-10-CM | POA: Diagnosis not present

## 2019-03-08 DIAGNOSIS — I509 Heart failure, unspecified: Secondary | ICD-10-CM | POA: Diagnosis not present

## 2019-03-08 DIAGNOSIS — G20 Parkinson's disease: Secondary | ICD-10-CM | POA: Diagnosis not present

## 2019-03-08 MED ORDER — MEMANTINE HCL ER 28 MG PO CP24: ORAL_CAPSULE | ORAL | 1 refills | Status: DC

## 2019-03-09 DIAGNOSIS — R531 Weakness: Secondary | ICD-10-CM | POA: Diagnosis not present

## 2019-03-09 DIAGNOSIS — I110 Hypertensive heart disease with heart failure: Secondary | ICD-10-CM | POA: Diagnosis not present

## 2019-03-09 DIAGNOSIS — R001 Bradycardia, unspecified: Secondary | ICD-10-CM | POA: Diagnosis not present

## 2019-03-09 DIAGNOSIS — I509 Heart failure, unspecified: Secondary | ICD-10-CM | POA: Diagnosis not present

## 2019-03-09 DIAGNOSIS — G20 Parkinson's disease: Secondary | ICD-10-CM | POA: Diagnosis not present

## 2019-03-09 DIAGNOSIS — F0280 Dementia in other diseases classified elsewhere without behavioral disturbance: Secondary | ICD-10-CM | POA: Diagnosis not present

## 2019-03-10 ENCOUNTER — Telehealth: Payer: Self-pay | Admitting: Internal Medicine

## 2019-03-10 ENCOUNTER — Other Ambulatory Visit: Payer: Self-pay

## 2019-03-10 ENCOUNTER — Telehealth (HOSPITAL_COMMUNITY): Payer: Self-pay | Admitting: *Deleted

## 2019-03-10 ENCOUNTER — Encounter (HOSPITAL_COMMUNITY): Payer: Self-pay | Admitting: Emergency Medicine

## 2019-03-10 ENCOUNTER — Emergency Department (HOSPITAL_COMMUNITY): Payer: Medicare Other

## 2019-03-10 ENCOUNTER — Emergency Department (HOSPITAL_COMMUNITY)
Admission: EM | Admit: 2019-03-10 | Discharge: 2019-03-10 | Disposition: A | Discharge status: Home / Self Care | Payer: Medicare Other | Attending: Emergency Medicine | Admitting: Emergency Medicine

## 2019-03-10 DIAGNOSIS — F0280 Dementia in other diseases classified elsewhere without behavioral disturbance: Secondary | ICD-10-CM | POA: Insufficient documentation

## 2019-03-10 DIAGNOSIS — Z7901 Long term (current) use of anticoagulants: Secondary | ICD-10-CM | POA: Insufficient documentation

## 2019-03-10 DIAGNOSIS — R5383 Other fatigue: Secondary | ICD-10-CM | POA: Diagnosis not present

## 2019-03-10 DIAGNOSIS — G20 Parkinson's disease: Secondary | ICD-10-CM | POA: Insufficient documentation

## 2019-03-10 DIAGNOSIS — I2510 Atherosclerotic heart disease of native coronary artery without angina pectoris: Secondary | ICD-10-CM | POA: Insufficient documentation

## 2019-03-10 DIAGNOSIS — Z20828 Contact with and (suspected) exposure to other viral communicable diseases: Secondary | ICD-10-CM | POA: Insufficient documentation

## 2019-03-10 DIAGNOSIS — R251 Tremor, unspecified: Secondary | ICD-10-CM | POA: Diagnosis not present

## 2019-03-10 DIAGNOSIS — G309 Alzheimer's disease, unspecified: Secondary | ICD-10-CM | POA: Diagnosis not present

## 2019-03-10 DIAGNOSIS — Z79899 Other long term (current) drug therapy: Secondary | ICD-10-CM | POA: Insufficient documentation

## 2019-03-10 DIAGNOSIS — I5022 Chronic systolic (congestive) heart failure: Secondary | ICD-10-CM | POA: Diagnosis not present

## 2019-03-10 DIAGNOSIS — R001 Bradycardia, unspecified: Secondary | ICD-10-CM | POA: Diagnosis not present

## 2019-03-10 DIAGNOSIS — I110 Hypertensive heart disease with heart failure: Secondary | ICD-10-CM | POA: Diagnosis not present

## 2019-03-10 DIAGNOSIS — R4182 Altered mental status, unspecified: Secondary | ICD-10-CM | POA: Diagnosis not present

## 2019-03-10 DIAGNOSIS — N390 Urinary tract infection, site not specified: Secondary | ICD-10-CM | POA: Insufficient documentation

## 2019-03-10 DIAGNOSIS — R531 Weakness: Secondary | ICD-10-CM | POA: Diagnosis not present

## 2019-03-10 DIAGNOSIS — Z8673 Personal history of transient ischemic attack (TIA), and cerebral infarction without residual deficits: Secondary | ICD-10-CM | POA: Diagnosis not present

## 2019-03-10 LAB — COMPREHENSIVE METABOLIC PANEL
ALT: 10 U/L (ref 0–44)
AST: 32 U/L (ref 15–41)
Albumin: 3.3 g/dL — ABNORMAL LOW (ref 3.5–5.0)
Alkaline Phosphatase: 41 U/L (ref 38–126)
Anion gap: 9 (ref 5–15)
BUN: 10 mg/dL (ref 8–23)
CO2: 23 mmol/L (ref 22–32)
Calcium: 9.1 mg/dL (ref 8.9–10.3)
Chloride: 108 mmol/L (ref 98–111)
Creatinine, Ser: 0.76 mg/dL (ref 0.61–1.24)
GFR calc Af Amer: >60 mL/min (ref >60)
GFR calc non Af Amer: >60 mL/min (ref >60)
Glucose, Bld: 80 mg/dL (ref 70–99)
Potassium: 4.4 mmol/L (ref 3.5–5.1)
Sodium: 140 mmol/L (ref 135–145)
Total Bilirubin: 0.5 mg/dL (ref 0.3–1.2)
Total Protein: 6.8 g/dL (ref 6.5–8.1)

## 2019-03-10 LAB — URINALYSIS, ROUTINE W REFLEX MICROSCOPIC
Bilirubin Urine: NEGATIVE
Glucose, UA: NEGATIVE mg/dL
Hgb urine dipstick: NEGATIVE
Ketones, ur: NEGATIVE mg/dL
Nitrite: NEGATIVE
Protein, ur: NEGATIVE mg/dL
Specific Gravity, Urine: 1.016 (ref 1.005–1.030)
WBC, UA: >50 WBC/hpf — ABNORMAL HIGH (ref 0–5)
pH: 6 (ref 5.0–8.0)

## 2019-03-10 LAB — PHOSPHORUS: Phosphorus: 3.3 mg/dL (ref 2.5–4.6)

## 2019-03-10 LAB — CBC WITH DIFFERENTIAL/PLATELET
Abs Immature Granulocytes: 0 10*3/uL (ref 0.00–0.07)
Basophils Absolute: 0 10*3/uL (ref 0.0–0.1)
Basophils Relative: 1 %
Eosinophils Absolute: 0.1 10*3/uL (ref 0.0–0.5)
Eosinophils Relative: 4 %
HCT: 40.5 % (ref 39.0–52.0)
Hemoglobin: 12.6 g/dL — ABNORMAL LOW (ref 13.0–17.0)
Immature Granulocytes: 0 %
Lymphocytes Relative: 42 %
Lymphs Abs: 1.1 10*3/uL (ref 0.7–4.0)
MCH: 31 pg (ref 26.0–34.0)
MCHC: 31.1 g/dL (ref 30.0–36.0)
MCV: 99.5 fL (ref 80.0–100.0)
Monocytes Absolute: 0.2 10*3/uL (ref 0.1–1.0)
Monocytes Relative: 9 %
Neutro Abs: 1.1 10*3/uL — ABNORMAL LOW (ref 1.7–7.7)
Neutrophils Relative %: 44 %
Platelets: 148 10*3/uL — ABNORMAL LOW (ref 150–400)
RBC: 4.07 MIL/uL — ABNORMAL LOW (ref 4.22–5.81)
RDW: 16.1 % — ABNORMAL HIGH (ref 11.5–15.5)
WBC: 2.6 10*3/uL — ABNORMAL LOW (ref 4.0–10.5)
nRBC: 0 % (ref 0.0–0.2)

## 2019-03-10 LAB — PROTIME-INR
INR: 1.9 — ABNORMAL HIGH (ref 0.8–1.2)
Prothrombin Time: 21.1 seconds — ABNORMAL HIGH (ref 11.4–15.2)

## 2019-03-10 LAB — MAGNESIUM: Magnesium: 1.8 mg/dL (ref 1.7–2.4)

## 2019-03-10 LAB — SARS CORONAVIRUS 2 BY RT PCR (HOSPITAL ORDER, PERFORMED IN ~~LOC~~ HOSPITAL LAB): SARS Coronavirus 2: NEGATIVE

## 2019-03-10 LAB — CBG MONITORING, ED: Glucose-Capillary: 93 mg/dL (ref 70–99)

## 2019-03-10 MED ORDER — SODIUM CHLORIDE 0.9 % IV SOLN
1.0000 g | Freq: Once | INTRAVENOUS | Status: AC
  Administered 2019-03-10: 1 g via INTRAVENOUS
  Filled 2019-03-10: qty 10

## 2019-03-10 MED ORDER — CEPHALEXIN 500 MG PO CAPS: 500.0000 mg | ORAL_CAPSULE | Freq: Two times a day (BID) | ORAL | 0 refills | Status: DC

## 2019-03-10 NOTE — ED Provider Notes (Signed)
[facility] EMERGENCY DEPARTMENT Provider Note   CSN: [id number] Arrival date & time: 03/10/19  1040    History   Chief Complaint Chief Complaint  Patient presents with  . Altered Mental Status  . Fatigue  . Tremors    HPI [name] [name] is a 85 y.o. male.     The history is provided by a relative and medical records. No language interpreter was used.  Altered Mental Status   [name] [name] is a 85 y.o. male who presents to the Emergency Department complaining of AMS. Level V caveat due to altered mental status. History is provided by the patient's daughter. Mr. Beaumier has a history of advanced Parkinson's disease and is total care dependent. He resides at home with his daughter. He has two CNA's as well as a home health nurse that help with his care. His daughter notes that since last night he has had a change in his mental status. She states that this is gradual and onset and may have slowly began a few days prior. She states that his countenance is changed and while he is normally more happy appearing he seems to be frowning with more grimace. He does occasionally respond verbally but it is uncommon. Last night he complained of pain. She states that he had about three hours of trembling episodes that she describes as jerking of his hands and feet. The episodes only lasted about 5 to 10 seconds each time. They were more profound when they attempted to move him. No reports of fevers, vomiting, diarrhea, dysuria. He is incontinent of stool and urine at baseline. He has had decreased oral intake. No known coronavirus exposures. No reports of difficulty breathing. She did states that when he had the episodes of trembling that his heart rate would go up to the one teens. He has experienced similar episodes in the past, related to intracranial hemorrhage. Symptoms are moderate to severe and worsening in nature.  Past Medical History:  Diagnosis Date  . Anxiety   .  Coronary artery disease, non-occlusive    with history of MI; Cath 2008 w multivessel nonobstructive CAD  . Dementia ([place])   . Depression   . GERD (gastroesophageal reflux disease)   . Hyperlipidemia   . Hypertension   . Memory loss   . Parkinson disease ([place])   . PE (pulmonary embolism)    unprovoked PE completed 6 months of warfarin, warfarin d/ced 02/12/2010, repeat PE 07/10/2010 after a long car ride and now on lifelong coumadin.  . Pituitary microadenoma ([place])    incidental finding CT 12/2009  . Prostate cancer ([place])   . Scoliosis   . Sigmoid volvulus ([place]) 08/02/2016  . Stroke ([place]) 03/2018  . Volvulus of colon ([place]) 06/01/2017    Patient Active Problem List   Diagnosis Date Noted  . Altered mental status   . Dehydration   . Hyperchloremia   . Hypernatremia 10/23/2018  . Ecchymosis 08/07/2018  . Pituitary macroadenoma ([place]) 06/06/2018  . Hemorrhagic stroke ([place])   . Prediabetes   . Dysphagia, post-stroke   . HTN (hypertension), benign   . Anemia of chronic disease   . ICH (intracerebral hemorrhage) ([place]) 05/05/2018  . Chalazion left upper eyelid 04/11/2018  . Lethargy 11/10/2017  . Fatigue 09/08/2017  . Need for immunization against influenza 09/08/2017  . Recurrent UTI 06/27/2017  . Pressure sore on buttocks 06/27/2017  . Volvulus of sigmoid colon ([place]) 05/31/2017  . Aortic atherosclerosis ([place]) 05/31/2017  . Loss of  weight 09/23/2016  . Chronic anticoagulation   . Right bundle branch block   . Hypokalemia 08/02/2016  . Diverticulosis 07/15/2016  . Statin myopathy 03/15/2016  . Parkinsonian syndrome ([place]) 08/11/2015  . Normocytic anemia, not due to blood loss 04/16/2015  . Chronic systolic heart failure ([place]) 04/16/2015  . Falls frequently 04/16/2015  . Onychomycosis 04/15/2015  . CAD (coronary artery disease) 01/16/2015  . 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 Healthcare maintenance 10/10/2014  . Lumbosacral spondylosis without myelopathy 09/24/2013  .  Bradycardia 02/20/2013  . Generalized ischemic cerebrovascular disease 02/05/2013  . Depression 01/25/2013  . Insomnia 01/20/2012  . Alzheimer's dementia ([place]) 08/17/2011  . History of pulmonary embolus (PE) 09/18/2010  . Encounter for long-term use of antiplatelets/antithrombotics 09/18/2010  . DJD (degenerative joint disease) 08/06/2010  . ADENOCARCINOMA, PROSTATE, HX OF 08/06/2010  . HLD (hyperlipidemia) 10/15/2009  . Essential hypertension, benign 10/15/2009  . GERD 10/15/2009    Past Surgical History:  Procedure Laterality Date  . CATARACT EXTRACTION    . COLONOSCOPY N/A 05/30/2017   Procedure: COLONOSCOPY;  Surgeon: Ronald Lobo, MD;  Location: [facility] ENDOSCOPY;  Service: Endoscopy;  Laterality: N/A;  . FLEXIBLE SIGMOIDOSCOPY N/A 08/02/2016   Procedure: FLEXIBLE SIGMOIDOSCOPY;  Surgeon: Ronald Lobo, MD;  Location: [facility] ENDOSCOPY;  Service: Endoscopy;  Laterality: N/A;  . FLEXIBLE SIGMOIDOSCOPY N/A 08/06/2016   Procedure: FLEXIBLE SIGMOIDOSCOPY;  Surgeon: Ronald Lobo, MD;  Location: [facility] ENDOSCOPY;  Service: Endoscopy;  Laterality: N/A;  . FLEXIBLE SIGMOIDOSCOPY Left 06/02/2017   Procedure: FLEXIBLE SIGMOIDOSCOPY;  Surgeon: Arta Silence, MD;  Location: [facility] ENDOSCOPY;  Service: Endoscopy;  Laterality: Left;  . FLEXIBLE SIGMOIDOSCOPY N/A 06/07/2017   Procedure: FLEXIBLE SIGMOIDOSCOPY for possible decompression;  Surgeon: Otis Brace, MD;  Location: [facility];  Service: Gastroenterology;  Laterality: N/A;  . LEFT HEART CATHETERIZATION WITH CORONARY ANGIOGRAM N/A 12/10/2014   Procedure: LEFT HEART CATHETERIZATION WITH CORONARY ANGIOGRAM;  Surgeon: [name], MD;  Location: [facility] CATH LAB;  Service: Cardiovascular;  Laterality: N/A;  . LOOP RECORDER IMPLANT N/A 12/16/2014   Procedure: LOOP RECORDER IMPLANT;  Surgeon: [name], MD;  Location: [facility] CATH LAB;  Service: Cardiovascular;  Laterality: N/A;  . PARTIAL COLECTOMY N/A 06/08/2017   Procedure: SIGMOID  COLECTOMY;   Surgeon: Georganna Skeans, MD;  Location: [facility];  Service: General;  Laterality: N/A;  . PROSTATECTOMY          Home Medications    Prior to Admission medications   Medication Sig Start Date End Date Taking? Authorizing Provider  acetaminophen (TYLENOL) 500 MG tablet Take 1,000 mg by mouth 2 (two) times daily.     [provider]  amLODipine (NORVASC) 2.5 MG tablet Take 0.5 tablets (1.25 mg total) by mouth daily. 02/22/19   [name], DO  atorvastatin (LIPITOR) 10 MG tablet Take 1 tablet (10 mg total) by mouth daily at 6 PM. 06/19/18   [name], DO  Ca Phosphate-Cholecalciferol (CALCIUM/VITAMIN D3 GUMMIES) 250-350 MG-UNIT CHEW Chew 1 Dose by mouth 2 (two) times daily. 07/08/16   [name], DO  carbidopa-levodopa (SINEMET IR) 25-100 MG tablet Take 1 tablet by mouth 5 (five) times daily. 03/05/19   Garvin Fila, MD  cephALEXin (KEFLEX) 500 MG capsule Take 1 capsule (500 mg total) by mouth 2 (two) times daily. 03/10/19   Quintella Reichert, MD  loratadine (CLARITIN) 10 MG tablet Take 10 mg by mouth daily.    [provider]  memantine (NAMENDA XR) 28 MG CP24 24 hr capsule TAKE 1  CAPSULE BY MOUTH EVERY DAY IN THE MORNING 03/08/19   Garvin Fila, MD  Multiple Vitamins-Minerals (MULTIVITAMIN WITH MINERALS) tablet Take 1 tablet by mouth daily.     [provider]  polyethylene glycol (MIRALAX / GLYCOLAX) packet Take 17 g by mouth daily as needed (constipation).    [provider]  potassium chloride SA (KLOR-CON M20) 20 MEQ tablet Take 2 tablets (40 mEq total) by mouth daily. 06/27/18   [name], DO  senna (SENOKOT) 8.6 MG TABS tablet Take 1 tablet (8.6 mg total) by mouth daily as needed for mild constipation. 07/13/16   Velna Ochs, MD  STARCH-MALTO DEXTRIN ([employer] INSTANT FOOD THICKENER) POWD Take 1 Scoop by mouth daily as needed. Patient taking differently: Take 1 Scoop by mouth daily as needed (food thickner).  09/15/18   Ledell Noss, MD   warfarin (COUMADIN) 5 MG tablet TAKE 1/2 TABLET BY MOUTH EVERY TUE, THU, SAT AND 1 TABLET BY MOUTH ALL OTHER DAYS 12/22/18   [name], DO    Family History Family History  Problem Relation Age of Onset  . Hypertension Father        Passed away from cerebral hemorrhage at age of 87.  [name] Kitchen Dementia Mother   . Hypertension Child        4 adult children  . Cervical cancer Other   . Lung cancer Other   . Stroke Other   . Heart attack Neg Hx     Social History Social History   Tobacco Use  . Smoking status: Never Smoker  . Smokeless tobacco: Never Used  Substance Use Topics  . Alcohol use: No    Alcohol/week: 0.0 standard drinks  . Drug use: No     Allergies   Aricept [donepezil hcl] and Shellfish allergy   Review of Systems Review of Systems  Unable to perform ROS: Patient nonverbal     Physical Exam Updated Vital Signs BP (!) 154/99   Pulse (!) 56   Temp 98.1 F (36.7 C) (Rectal)   Resp 12   Ht 5\' 8"  (1.727 m)   Wt 63.5 kg   SpO2 100%   BMI 21.29 kg/m   Physical Exam Vitals signs and nursing note reviewed.  Constitutional:      Appearance: He is well-developed.     Comments: Lethargic  HENT:     Head: Normocephalic and atraumatic.     Mouth/Throat:     Mouth: Mucous membranes are dry.  Cardiovascular:     Rate and Rhythm: Regular rhythm.     Comments: Bradycardia Pulmonary:     Effort: Pulmonary effort is normal. No respiratory distress.  Abdominal:     Palpations: Abdomen is soft.     Tenderness: There is no abdominal tenderness. There is no guarding or rebound.  Musculoskeletal:        General: No swelling or tenderness.  Skin:    General: Skin is warm and dry.     Capillary Refill: Capillary refill takes less than 2 seconds.  Neurological:     Comments: Does not follow commands. There is weak grip to bilateral upper extremities. There is a fine tremor to bilateral upper extremities. Does not open eyes but actively resists eye-opening.  Increased tone in all four extremities.  Psychiatric:     Comments: Unable to assess      ED Treatments / Results  Labs (all labs ordered are listed, but only abnormal results are displayed) Labs Reviewed  COMPREHENSIVE METABOLIC PANEL -  Abnormal; Notable for the following components:      Result Value   Albumin 3.3 (*)    All other components within normal limits  CBC WITH DIFFERENTIAL/PLATELET - Abnormal; Notable for the following components:   WBC 2.6 (*)    RBC 4.07 (*)    Hemoglobin 12.6 (*)    RDW 16.1 (*)    Platelets 148 (*)    Neutro Abs 1.1 (*)    All other components within normal limits  URINALYSIS, ROUTINE W REFLEX MICROSCOPIC - Abnormal; Notable for the following components:   APPearance HAZY (*)    Leukocytes,Ua MODERATE (*)    WBC, UA >50 (*)    Bacteria, UA RARE (*)    All other components within normal limits  PROTIME-INR - Abnormal; Notable for the following components:   Prothrombin Time 21.1 (*)    INR 1.9 (*)    All other components within normal limits  SARS CORONAVIRUS 2 (HOSPITAL ORDER, [facility])  URINE CULTURE  MAGNESIUM  PHOSPHORUS  CBG MONITORING, ED    EKG None ED ECG REPORT   Date: 03/10/2019  Rate: 58  Rhythm: sinus bradycardia  QRS Axis: normal  Intervals: normal  ST/T Wave abnormalities: normal  Conduction Disutrbances:right bundle branch block  Narrative Interpretation:   Old EKG Reviewed: unchanged  I have personally reviewed the EKG tracing and disagree with the computerized printout as noted.  Artifact noted.    Radiology Ct Head Wo Contrast  Result Date: 03/10/2019 CLINICAL DATA:  85 year old male with new altered mental status and generalized weakness. EXAM: CT HEAD WITHOUT CONTRAST TECHNIQUE: Contiguous axial images were obtained from the base of the skull through the vertex without intravenous contrast. COMPARISON:  10/23/2018 CT and prior studies FINDINGS: Brain: No acute intracranial  abnormality identified including acute infarct, new mass, midline shift, hydrocephalus, extra-axial collection or hemorrhage. Atrophy, chronic small-vessel white matter ischemic changes, remote RIGHT thalamic infarct and pituitary mass/adenoma again noted. Vascular: Carotid and vertebral atherosclerotic calcifications again noted. Skull: Normal. Negative for fracture or focal lesion. Sinuses/Orbits: No acute abnormality Other: None IMPRESSION: 1. No evidence of acute intracranial abnormality 2. Atrophy, chronic small-vessel white matter ischemic changes, remote RIGHT thalamic infarct and unchanged pituitary mass/adenoma. Electronically Signed   By: Margarette [place] M.D.   On: 03/10/2019 13:16   Dg Chest Port 1 View  Result Date: 03/10/2019 CLINICAL DATA:  Altered mental status and weakness EXAM: PORTABLE CHEST 1 VIEW COMPARISON:  11/12/2018 and prior radiographs FINDINGS: This is a low volume study. Elevation of the LEFT hemidiaphragm and LEFT basilar atelectasis/scarring again noted. Cardiomediastinal silhouette is unchanged. There is no evidence of focal airspace disease, pulmonary edema, suspicious pulmonary nodule/mass, pleural effusion, or pneumothorax. No acute bony abnormalities are identified. IMPRESSION: Low volume study without evidence of acute cardiopulmonary disease. Electronically Signed   By: Margarette [place] M.D.   On: 03/10/2019 13:12    Procedures Procedures (including critical care time)  Medications Ordered in ED Medications  cefTRIAXone (ROCEPHIN) 1 g in sodium chloride 0.9 % 100 mL IVPB (0 g Intravenous Stopped 03/10/19 1329)     Initial Impression / Assessment and Plan / ED Course  I have reviewed the triage vital signs and the nursing notes.  Pertinent labs & imaging results that were available during my care of the patient were reviewed by me and considered in my medical decision making (see chart for details).        Patient with history of advanced Parkinson's  disease here  for evaluation of increased tremor activity as well as intermittent tachycardia to the one teens at home. He is chronically ill appearing on evaluation. Per daughter he is near his baseline currently. UA is concerning for UTI in setting of numerous WBC as well as bacteria. Will treat with antibiotics given his symptoms. Labs demonstrate normal renal function, mild pancytopenia Rapid COVID is negative. CT head and chest x-ray without acute abnormalities. Discussed with daughter findings of UTI. He does have access to care at home with CNA's as well as his daughter and is able to hydrate and take medications. Plan to discharge home on oral antibiotics with close outpatient follow-up and return precautions.  Final Clinical Impressions(s) / ED Diagnoses   Final diagnoses:  Acute UTI  Tremor    ED Discharge Orders         Ordered    cephALEXin (KEFLEX) 500 MG capsule  2 times daily     03/10/19 1420           Quintella Reichert, MD 03/10/19 1535

## 2019-03-10 NOTE — Telephone Encounter (Addendum)
Called by daughter with some concerns.  Father's heart rate 115, woke up grimacing and uncomfortable then went back to normal HR and stopped grimacing on his own.  bp 157/107 at the time. He does have periods where his tremors seem to come on suddenly.  Currently he is resting comfortably without issue.  At home she reports he is urinating well and getting plenty of fluids, but not eating that well due to poor swallowing.  Tremors are a little worse. Saw neurology who increased sinemet which she feels has helped but sometimes difficult getting the doses down due to difficulty swallowing.  She is concerned about electrolyte abnormalities given he has had some in the past.  She understands that the clinic is closed but is worried about going to the ED and wonders if it is necessary.  She feels that he is not quite himself and would like him to be examined.  We discussed the option of urgent care and she will take him this morning.

## 2019-03-10 NOTE — ED Notes (Signed)
Patient verbalizes understanding of discharge instructions. Opportunity for questioning and answers were provided. Pt discharged from ED. 

## 2019-03-10 NOTE — Telephone Encounter (Signed)
Pt noted to have appt with CC: "tremors and fluctuating heartrate".  Discussed with Claudine Mouton, NP.  Upon review of medical record, per K. Harris, NP - pt needs to be seen in ED.  Informed pt's daughter after verification of DOB.  Daughter verbalized understanding.

## 2019-03-10 NOTE — ED Triage Notes (Signed)
Pt in with AMS and weakness, progressively worse x past week per family. Pt only occasionally responds to staff, no verbalization, not following commands. Sent by doc for eval of possible low K? Family reports frequent tremors recently

## 2019-03-11 DIAGNOSIS — F0280 Dementia in other diseases classified elsewhere without behavioral disturbance: Secondary | ICD-10-CM | POA: Diagnosis not present

## 2019-03-11 DIAGNOSIS — I110 Hypertensive heart disease with heart failure: Secondary | ICD-10-CM | POA: Diagnosis not present

## 2019-03-11 DIAGNOSIS — G20 Parkinson's disease: Secondary | ICD-10-CM | POA: Diagnosis not present

## 2019-03-11 DIAGNOSIS — I509 Heart failure, unspecified: Secondary | ICD-10-CM | POA: Diagnosis not present

## 2019-03-11 DIAGNOSIS — R531 Weakness: Secondary | ICD-10-CM | POA: Diagnosis not present

## 2019-03-11 DIAGNOSIS — R001 Bradycardia, unspecified: Secondary | ICD-10-CM | POA: Diagnosis not present

## 2019-03-11 LAB — URINE CULTURE: Culture: >=100000 — AB

## 2019-03-12 ENCOUNTER — Telehealth: Payer: Self-pay | Admitting: Emergency Medicine

## 2019-03-12 ENCOUNTER — Other Ambulatory Visit: Payer: Self-pay | Admitting: Internal Medicine

## 2019-03-12 DIAGNOSIS — G20 Parkinson's disease: Secondary | ICD-10-CM | POA: Diagnosis not present

## 2019-03-12 DIAGNOSIS — E876 Hypokalemia: Secondary | ICD-10-CM

## 2019-03-12 DIAGNOSIS — I110 Hypertensive heart disease with heart failure: Secondary | ICD-10-CM | POA: Diagnosis not present

## 2019-03-12 DIAGNOSIS — I509 Heart failure, unspecified: Secondary | ICD-10-CM | POA: Diagnosis not present

## 2019-03-12 DIAGNOSIS — R001 Bradycardia, unspecified: Secondary | ICD-10-CM | POA: Diagnosis not present

## 2019-03-12 DIAGNOSIS — F0280 Dementia in other diseases classified elsewhere without behavioral disturbance: Secondary | ICD-10-CM | POA: Diagnosis not present

## 2019-03-12 DIAGNOSIS — R531 Weakness: Secondary | ICD-10-CM | POA: Diagnosis not present

## 2019-03-12 MED ORDER — POTASSIUM CHLORIDE CRYS ER 20 MEQ PO TBCR: 40.0000 meq | EXTENDED_RELEASE_TABLET | Freq: Every day | ORAL | 1 refills | Status: DC

## 2019-03-12 NOTE — Telephone Encounter (Signed)
Post ED Visit - Positive Culture Follow-up  Culture report reviewed by antimicrobial stewardship pharmacist: [place] Team []  [name], Pharm.D. []  Heide Guile, Pharm.D., BCPS AQ-ID []  Parks Neptune, Pharm.D., BCPS []  Alycia Rossetti, Pharm.D., BCPS []  [place], Pharm.D., BCPS, AAHIVP []  Legrand Como, Pharm.D., BCPS, AAHIVP [x]  Salome Arnt, PharmD, BCPS []  Johnnette Gourd, PharmD, BCPS []  Hughes Better, PharmD, BCPS []  Leeroy Cha, PharmD []  Laqueta Linden, PharmD, BCPS []  Albertina Parr, PharmD  [place] Team []  Leodis Sias, PharmD []  Lindell Spar, PharmD []  Royetta Asal, PharmD []  Graylin Shiver, Rph []  Rema Fendt) Glennon Mac, PharmD []  Arlyn Dunning, PharmD []  Netta Cedars, PharmD []  Dia Sitter, PharmD []  Leone Haven, PharmD []  Gretta Arab, PharmD []  Theodis Shove, PharmD []  Peggyann Juba, PharmD []  Reuel Boom, PharmD   Positive urine culture Treated with cephalexin, organism sensitive to the same and no further patient follow-up is required at this time.  Hazle Nordmann 03/12/2019, 8:35 AM

## 2019-03-12 NOTE — Telephone Encounter (Signed)
Refill Request   potassium chloride SA (KLOR-CON M20) 20 MEQ tablet  [id number] [place], [place] - [address]

## 2019-03-13 ENCOUNTER — Ambulatory Visit: Payer: Medicare HMO

## 2019-03-13 ENCOUNTER — Emergency Department (HOSPITAL_COMMUNITY): Payer: Medicare Other

## 2019-03-13 ENCOUNTER — Telehealth: Payer: Self-pay | Admitting: Internal Medicine

## 2019-03-13 ENCOUNTER — Emergency Department (HOSPITAL_COMMUNITY)
Admission: EM | Admit: 2019-03-13 | Discharge: 2019-03-13 | Disposition: A | Discharge status: Home / Self Care | Payer: Medicare Other | Attending: Emergency Medicine | Admitting: Emergency Medicine

## 2019-03-13 ENCOUNTER — Other Ambulatory Visit: Payer: Self-pay

## 2019-03-13 DIAGNOSIS — R109 Unspecified abdominal pain: Secondary | ICD-10-CM | POA: Diagnosis not present

## 2019-03-13 DIAGNOSIS — R0902 Hypoxemia: Secondary | ICD-10-CM | POA: Diagnosis not present

## 2019-03-13 DIAGNOSIS — I110 Hypertensive heart disease with heart failure: Secondary | ICD-10-CM | POA: Insufficient documentation

## 2019-03-13 DIAGNOSIS — R52 Pain, unspecified: Secondary | ICD-10-CM | POA: Diagnosis not present

## 2019-03-13 DIAGNOSIS — R140 Abdominal distension (gaseous): Secondary | ICD-10-CM | POA: Insufficient documentation

## 2019-03-13 DIAGNOSIS — Z79899 Other long term (current) drug therapy: Secondary | ICD-10-CM | POA: Insufficient documentation

## 2019-03-13 DIAGNOSIS — F0390 Unspecified dementia without behavioral disturbance: Secondary | ICD-10-CM | POA: Insufficient documentation

## 2019-03-13 DIAGNOSIS — I5022 Chronic systolic (congestive) heart failure: Secondary | ICD-10-CM | POA: Insufficient documentation

## 2019-03-13 DIAGNOSIS — I2510 Atherosclerotic heart disease of native coronary artery without angina pectoris: Secondary | ICD-10-CM | POA: Insufficient documentation

## 2019-03-13 DIAGNOSIS — Z7901 Long term (current) use of anticoagulants: Secondary | ICD-10-CM | POA: Insufficient documentation

## 2019-03-13 DIAGNOSIS — R404 Transient alteration of awareness: Secondary | ICD-10-CM | POA: Diagnosis not present

## 2019-03-13 DIAGNOSIS — R1084 Generalized abdominal pain: Secondary | ICD-10-CM | POA: Diagnosis not present

## 2019-03-13 LAB — MAGNESIUM: Magnesium: 1.8 mg/dL (ref 1.7–2.4)

## 2019-03-13 LAB — CBC WITH DIFFERENTIAL/PLATELET
Abs Immature Granulocytes: 0 10*3/uL (ref 0.00–0.07)
Basophils Absolute: 0 10*3/uL (ref 0.0–0.1)
Basophils Relative: 1 %
Eosinophils Absolute: 0.1 10*3/uL (ref 0.0–0.5)
Eosinophils Relative: 4 %
HCT: 41.3 % (ref 39.0–52.0)
Hemoglobin: 12.7 g/dL — ABNORMAL LOW (ref 13.0–17.0)
Immature Granulocytes: 0 %
Lymphocytes Relative: 40 %
Lymphs Abs: 1.3 10*3/uL (ref 0.7–4.0)
MCH: 30.8 pg (ref 26.0–34.0)
MCHC: 30.8 g/dL (ref 30.0–36.0)
MCV: 100 fL (ref 80.0–100.0)
Monocytes Absolute: 0.3 10*3/uL (ref 0.1–1.0)
Monocytes Relative: 8 %
Neutro Abs: 1.6 10*3/uL — ABNORMAL LOW (ref 1.7–7.7)
Neutrophils Relative %: 47 %
Platelets: 171 10*3/uL (ref 150–400)
RBC: 4.13 MIL/uL — ABNORMAL LOW (ref 4.22–5.81)
RDW: 16.3 % — ABNORMAL HIGH (ref 11.5–15.5)
WBC: 3.3 10*3/uL — ABNORMAL LOW (ref 4.0–10.5)
nRBC: 0 % (ref 0.0–0.2)

## 2019-03-13 LAB — LIPASE, BLOOD: Lipase: 26 U/L (ref 11–51)

## 2019-03-13 LAB — COMPREHENSIVE METABOLIC PANEL
ALT: 14 U/L (ref 0–44)
AST: 34 U/L (ref 15–41)
Albumin: 3.4 g/dL — ABNORMAL LOW (ref 3.5–5.0)
Alkaline Phosphatase: 43 U/L (ref 38–126)
Anion gap: 6 (ref 5–15)
BUN: 14 mg/dL (ref 8–23)
CO2: 25 mmol/L (ref 22–32)
Calcium: 9.3 mg/dL (ref 8.9–10.3)
Chloride: 112 mmol/L — ABNORMAL HIGH (ref 98–111)
Creatinine, Ser: 0.93 mg/dL (ref 0.61–1.24)
GFR calc Af Amer: >60 mL/min (ref >60)
GFR calc non Af Amer: >60 mL/min (ref >60)
Glucose, Bld: 84 mg/dL (ref 70–99)
Potassium: 4 mmol/L (ref 3.5–5.1)
Sodium: 143 mmol/L (ref 135–145)
Total Bilirubin: 0.7 mg/dL (ref 0.3–1.2)
Total Protein: 6.9 g/dL (ref 6.5–8.1)

## 2019-03-13 LAB — PROTIME-INR
INR: 2.4 — ABNORMAL HIGH (ref 0.8–1.2)
Prothrombin Time: 25.9 seconds — ABNORMAL HIGH (ref 11.4–15.2)

## 2019-03-13 LAB — I-STAT CREATININE, ED: Creatinine, Ser: 0.8 mg/dL (ref 0.61–1.24)

## 2019-03-13 MED ORDER — SODIUM CHLORIDE 0.9 % IV BOLUS
1000.0000 mL | Freq: Once | INTRAVENOUS | Status: AC
  Administered 2019-03-13: 1000 mL via INTRAVENOUS

## 2019-03-13 MED ORDER — IOHEXOL 300 MG/ML  SOLN
100.0000 mL | Freq: Once | INTRAMUSCULAR | Status: AC | PRN
  Administered 2019-03-13: 100 mL via INTRAVENOUS

## 2019-03-13 NOTE — ED Notes (Signed)
..  Patient's caregiver verbalizes understanding of discharge instructions. Opportunity for questioning and answers were provided. Armband removed by staff, pt discharged from ED.

## 2019-03-13 NOTE — Telephone Encounter (Signed)
RTC to Pt's daughter, [name].  She states Pt has tightness in his stomach and is very hard to touch with having only a "smear" BM yesterday.  Also states there is a "bulge" noted in abdomen and pt is frowning and grimacing.  States pt mostly drinking, able to keep fluids down.  Daughter is requesting an appt with Dr. Heber [place], this nurse informed daughter, office appt might not be appropriate and RN will check with Dr. Heber [place] or attending to advise. Dr. Heber  informed of above and states he will call [name]. SChaplin, RN,BSN

## 2019-03-13 NOTE — ED Triage Notes (Addendum)
Patient brought in by ems from home for c/o abd distention x 2 days ; pt recently seen here 3 days ago for + UTI ; per family , pt has hx of " stomach twisting and has a procedure to prevent the stomach from twisting " pt has hx of dementia and is at baseline ; no n/v

## 2019-03-13 NOTE — Telephone Encounter (Signed)
[name] would like a nurse to callback [phone]

## 2019-03-13 NOTE — Telephone Encounter (Signed)
[name] called back and states she does not want the 215 pm appt and wants to bring pt in this morning to be seen in [facility].  Pt scheduled at 1015 per daughter's request. SChaplin, RN,BSN

## 2019-03-13 NOTE — Telephone Encounter (Signed)
Spoke with andrea and confirmed history, patient does have history of sigmoid volvulus however this has been better as of late (post surgery), overall I am concerned about constipation from the history, [name] has some understandable concerns about coming to the ED or even office during the coronavirus pandemic but will if needed.  We discussed trying an enema at home this morning.  Will provide appointment at 215pm today to be seen in [facility]. She will call and cancel appointment if he gets better after enema.

## 2019-03-13 NOTE — ED Provider Notes (Signed)
[facility] EMERGENCY DEPARTMENT Provider Note   CSN: [id number] Arrival date & time: 03/13/19  1041    History   Chief Complaint Chief Complaint  Patient presents with   abd distention    HPI [name] [name] is a 85 y.o. male.     85 yo M with a chief complaint of abdominal distention.  Patient has a history of a volvulus in the past that required surgical repair.  No other prior abdominal surgeries per the family.  Patient is nonverbal at baseline his daughter gives most of the history.  No nausea or vomiting.  Usually has a bowel movement every few days or so.  Is been a couple days since his last one.  No fevers.  Seen recently in the emergency department for altered mental status and found to have a possible urinary tract infection and started on antibiotics.  The history is provided by a relative.  Illness Severity:  Mild Onset quality:  Gradual Duration:  2 days Timing:  Constant Progression:  Worsening Chronicity:  Recurrent Associated symptoms: no abdominal pain, no chest pain, no congestion, no diarrhea, no fever, no headaches, no myalgias, no nausea, no rash, no shortness of breath and no vomiting     Past Medical History:  Diagnosis Date   Anxiety    Coronary artery disease, non-occlusive    with history of MI; Cath 2008 w multivessel nonobstructive CAD   Dementia ([place])    Depression    GERD (gastroesophageal reflux disease)    Hyperlipidemia    Hypertension    Memory loss    Parkinson disease ([place])    PE (pulmonary embolism)    unprovoked PE completed 6 months of warfarin, warfarin d/ced 02/12/2010, repeat PE 07/10/2010 after a long car ride and now on lifelong coumadin.   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   Stroke ([place]) 03/2018   Volvulus of colon ([place]) 06/01/2017    Patient Active Problem List   Diagnosis Date  Noted   Altered mental status    Dehydration    Hyperchloremia    Hypernatremia 10/23/2018   Ecchymosis 08/07/2018   Pituitary macroadenoma ([place]) 06/06/2018   Hemorrhagic stroke ([place])    Prediabetes    Dysphagia, post-stroke    HTN (hypertension), benign    Anemia of chronic disease    ICH (intracerebral hemorrhage) ([place]) 05/05/2018   Chalazion left upper eyelid 04/11/2018   Lethargy 11/10/2017   Fatigue 09/08/2017   Need for immunization against influenza 09/08/2017   Recurrent UTI 06/27/2017   Pressure sore on buttocks 06/27/2017   Volvulus of sigmoid colon ([place]) 05/31/2017   Aortic atherosclerosis ([place]) 05/31/2017   Loss of weight 09/23/2016   Chronic anticoagulation    Right bundle branch block    Hypokalemia 08/02/2016   Diverticulosis 07/15/2016   Statin myopathy 03/15/2016   Parkinsonian syndrome ([place]) 08/11/2015   Normocytic anemia, not due to blood loss 81/09/7508   Chronic systolic heart failure ([place]) 04/16/2015   Falls frequently 04/16/2015   Onychomycosis 04/15/2015   CAD (coronary artery disease) 01/16/2015   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   Healthcare maintenance 10/10/2014   Lumbosacral spondylosis without myelopathy 09/24/2013   Bradycardia 02/20/2013   Generalized ischemic cerebrovascular disease 02/05/2013   Depression 01/25/2013   Insomnia 01/20/2012   Alzheimer's dementia ([place]) 08/17/2011   History of pulmonary embolus (PE) 09/18/2010   Encounter for  long-term use of antiplatelets/antithrombotics 09/18/2010   DJD (degenerative joint disease) 08/06/2010   ADENOCARCINOMA, PROSTATE, HX OF 08/06/2010   HLD (hyperlipidemia) 10/15/2009   Essential hypertension, benign 10/15/2009   GERD 10/15/2009    Past Surgical History:  Procedure Laterality Date   CATARACT EXTRACTION     COLONOSCOPY N/A 05/30/2017   Procedure: COLONOSCOPY;  Surgeon: Ronald Lobo, MD;  Location: [facility];  Service: Endoscopy;   Laterality: N/A;   FLEXIBLE SIGMOIDOSCOPY N/A 08/02/2016   Procedure: FLEXIBLE SIGMOIDOSCOPY;  Surgeon: Ronald Lobo, MD;  Location: [facility] ENDOSCOPY;  Service: Endoscopy;  Laterality: N/A;   FLEXIBLE SIGMOIDOSCOPY N/A 08/06/2016   Procedure: FLEXIBLE SIGMOIDOSCOPY;  Surgeon: Ronald Lobo, MD;  Location: [facility] ENDOSCOPY;  Service: Endoscopy;  Laterality: N/A;   FLEXIBLE SIGMOIDOSCOPY Left 06/02/2017   Procedure: FLEXIBLE SIGMOIDOSCOPY;  Surgeon: Arta Silence, MD;  Location[facility] ENDOSCOPY;  Service: Endoscopy;  Laterality: Left;   FLEXIBLE SIGMOIDOSCOPY N/A 06/07/2017   Procedure: FLEXIBLE SIGMOIDOSCOPY for possible decompression;  Surgeon: Otis Brace, MD;  Location: [facility];  Service: Gastroenterology;  Laterality: N/A;   LEFT HEART CATHETERIZATION WITH CORONARY ANGIOGRAM N/A 12/10/2014   Procedure: LEFT HEART CATHETERIZATION WITH CORONARY ANGIOGRAM;  Surgeon: [name], MD;  Location[facility] CATH LAB;  Service: Cardiovascular;  Laterality: N/A;   LOOP RECORDER IMPLANT N/A 12/16/2014   Procedure: LOOP RECORDER IMPLANT;  Surgeon: [name], MD;  Location: [facility] CATH LAB;  Service: Cardiovascular;  Laterality: N/A;   PARTIAL COLECTOMY N/A 06/08/2017   Procedure: SIGMOID  COLECTOMY;  Surgeon: Georganna Skeans, MD;  Location: [facility];  Service: General;  Laterality: N/A;   PROSTATECTOMY          Home Medications    Prior to Admission medications   Medication Sig Start Date End Date Taking? Authorizing Provider  acetaminophen (TYLENOL) 500 MG tablet Take 1,000 mg by mouth 2 (two) times daily.    Yes [provider]  amLODipine (NORVASC) 2.5 MG tablet Take 0.5 tablets (1.25 mg total) by mouth daily. 02/22/19  Yes [name], DO  b complex vitamins tablet Take 1 tablet by mouth daily.   Yes [provider]  carbidopa-levodopa (SINEMET IR) 25-100 MG tablet Take 1 tablet by mouth 5 (five) times daily. 03/05/19  Yes Garvin Fila, MD  cephALEXin (KEFLEX) 500 MG  capsule Take 1 capsule (500 mg total) by mouth 2 (two) times daily. 03/10/19  Yes Quintella Reichert, MD  loratadine (CLARITIN) 10 MG tablet Take 10 mg by mouth daily.   Yes [provider]  memantine (NAMENDA XR) 28 MG CP24 24 hr capsule TAKE 1 CAPSULE BY MOUTH EVERY DAY IN THE MORNING Patient taking differently: Take 28 mg by mouth daily.  03/08/19  Yes Garvin Fila, MD  Multiple Vitamins-Minerals (MULTIVITAMIN WITH MINERALS) tablet Take 1 tablet by mouth daily.    Yes [provider]  omega-3 acid ethyl esters (LOVAZA) 1 g capsule Take 1 g by mouth daily.   Yes [provider]  polyethylene glycol (MIRALAX / GLYCOLAX) packet Take 17 g by mouth daily as needed (constipation).   Yes [provider]  potassium chloride SA (KLOR-CON M20) 20 MEQ tablet Take 2 tablets (40 mEq total) by mouth daily. 03/12/19  Yes [name], DO  senna (SENOKOT) 8.6 MG TABS tablet Take 1 tablet (8.6 mg total) by mouth daily as needed for mild constipation. 07/13/16  Yes Velna Ochs, MD  STARCH-MALTO DEXTRIN ([employer] INSTANT FOOD THICKENER) POWD Take 1 Scoop by mouth daily  as needed. Patient taking differently: Take 1 Scoop by mouth daily as needed (food thickner).  09/15/18  Yes Ledell Noss, MD  warfarin (COUMADIN) 5 MG tablet TAKE 1/2 TABLET BY MOUTH EVERY TUE, THU, SAT AND 1 TABLET BY MOUTH ALL OTHER DAYS Patient taking differently: Take 5 mg by mouth See admin instructions. TAKE 2.5 mg TABLET BY MOUTH EVERY TUE, THU, SAT  Takes 5 mg TABLET BY MOUTH ALL OTHER DAYS in the evening 12/22/18  Yes [name], DO  Zinc Oxide 10 % OINT Apply 1 application topically daily. On penis   Yes [provider]  atorvastatin (LIPITOR) 10 MG tablet Take 1 tablet (10 mg total) by mouth daily at 6 PM. Patient not taking: Reported on 03/13/2019 06/19/18   [name], DO  Ca Phosphate-Cholecalciferol (CALCIUM/VITAMIN D3 GUMMIES) 250-350 MG-UNIT CHEW Chew 1 Dose by mouth 2 (two) times  daily. Patient not taking: Reported on 03/13/2019 07/08/16   [name], DO    Family History Family History  Problem Relation Age of Onset   Hypertension Father        Passed away from cerebral hemorrhage at age of 68.   Dementia Mother    Hypertension Child        4 adult children   Cervical cancer Other    Lung cancer Other    Stroke Other    Heart attack Neg Hx     Social History Social History   Tobacco Use   Smoking status: Never Smoker   Smokeless tobacco: Never Used  Substance Use Topics   Alcohol use: No    Alcohol/week: 0.0 standard drinks   Drug use: No     Allergies   Aricept [donepezil hcl] and Shellfish allergy   Review of Systems Review of Systems  Unable to perform ROS: Patient nonverbal  Constitutional: Negative for chills and fever.  HENT: Negative for congestion and facial swelling.   Eyes: Negative for discharge and visual disturbance.  Respiratory: Negative for shortness of breath.   Cardiovascular: Negative for chest pain and palpitations.  Gastrointestinal: Positive for abdominal distention. Negative for abdominal pain, diarrhea, nausea and vomiting.  Musculoskeletal: Negative for arthralgias and myalgias.  Skin: Negative for color change and rash.  Neurological: Negative for tremors, syncope and headaches.  Psychiatric/Behavioral: Negative for confusion and dysphoric mood.     Physical Exam Updated Vital Signs BP (!) 131/107    Pulse 64    Temp 98.2 F (36.8 C) (Oral)    Resp 16    SpO2 98%   Physical Exam Vitals signs and nursing note reviewed.  Constitutional:      Appearance: He is well-developed.     Comments: Cachectic, chronically ill-appearing.  HENT:     Head: Normocephalic and atraumatic.  Eyes:     Pupils: Pupils are equal, round, and reactive to light.  Neck:     Musculoskeletal: Normal range of motion and neck supple.     Vascular: No JVD.  Cardiovascular:     Rate and Rhythm: Normal rate and  regular rhythm.     Heart sounds: No murmur. No friction rub. No gallop.   Pulmonary:     Effort: No respiratory distress.     Breath sounds: No wheezing.  Abdominal:     General: There is distension.     Tenderness: There is abdominal tenderness. There is no guarding or rebound.     Comments: Distention that is tympanic to percussion in the upper abdomen.  Tender.  Musculoskeletal: Normal range of motion.  Skin:    Coloration: Skin is not pale.     Findings: No rash.  Neurological:     Mental Status: He is alert and oriented to person, place, and time.  Psychiatric:        Behavior: Behavior normal.      ED Treatments / Results  Labs (all labs ordered are listed, but only abnormal results are displayed) Labs Reviewed  CBC WITH DIFFERENTIAL/PLATELET - Abnormal; Notable for the following components:      Result Value   WBC 3.3 (*)    RBC 4.13 (*)    Hemoglobin 12.7 (*)    RDW 16.3 (*)    Neutro Abs 1.6 (*)    All other components within normal limits  COMPREHENSIVE METABOLIC PANEL - Abnormal; Notable for the following components:   Chloride 112 (*)    Albumin 3.4 (*)    All other components within normal limits  PROTIME-INR - Abnormal; Notable for the following components:   Prothrombin Time 25.9 (*)    INR 2.4 (*)    All other components within normal limits  MAGNESIUM  LIPASE, BLOOD  I-STAT CREATININE, ED    EKG None  Radiology Ct Abdomen Pelvis W Contrast  Result Date: 03/13/2019 CLINICAL DATA:  Acute abdominal pain EXAM: CT ABDOMEN AND PELVIS WITH CONTRAST TECHNIQUE: Multidetector CT imaging of the abdomen and pelvis was performed using the standard protocol following bolus administration of intravenous contrast. CONTRAST:  132mL OMNIPAQUE IOHEXOL 300 MG/ML  SOLN COMPARISON:  06/15/2017 FINDINGS: Lower chest: Bibasilar atelectasis. Hepatobiliary: No focal liver abnormality is seen. No gallstones, gallbladder wall thickening, or biliary dilatation. Pancreas:  Unremarkable. No pancreatic ductal dilatation or surrounding inflammatory changes. Spleen: Normal in size without focal abnormality. Adrenals/Urinary Tract: Normal adrenal glands. 3.6 cm hypodense, fluid attenuating right renal mass consistent with a cyst. Multiple small subcentimeter hypodense masses in the left kidney likely reflecting small cysts which are unchanged compared with 06/15/2017. No urolithiasis or obstructive uropathy. Bladder wall thickening which may be secondary to underdistention versus cystitis or bladder outlet obstruction. Stomach/Bowel: Stomach is within normal limits. No pneumatosis, pneumoperitoneum or portal venous gas. No evidence of bowel wall thickening, distention, or inflammatory changes. Large amount of stool throughout the colon. Vascular/Lymphatic: Normal caliber abdominal aorta with mild atherosclerosis. No lymphadenopathy. Reproductive: Prior prostatectomy. Other: No abdominal wall hernia or abnormality. No abdominopelvic ascites. Musculoskeletal: No acute osseous abnormality. No aggressive osseous lesion. Degenerative disease with disc height loss throughout the thoracolumbar spine with facet arthropathy. Mild osteoarthritis of bilateral sacroiliac joints. IMPRESSION: 1. Large amount of stool throughout the colon. 2. No acute abdominal or pelvic pathology. 3.  Aortic Atherosclerosis (ICD10-I70.0). 4. Diffuse lumbar spine spondylosis as described above. Electronically Signed   By: Kathreen Devoid   On: 03/13/2019 15:32    Procedures Procedures (including critical care time)  Medications Ordered in ED Medications  sodium chloride 0.9 % bolus 1,000 mL (1,000 mLs Intravenous New Bag/Given 03/13/19 1301)  iohexol (OMNIPAQUE) 300 MG/ML solution 100 mL (100 mLs Intravenous Contrast Given 03/13/19 1517)     Initial Impression / Assessment and Plan / ED Course  I have reviewed the triage vital signs and the nursing notes.  Pertinent labs & imaging results that were available  during my care of the patient were reviewed by me and considered in my medical decision making (see chart for details).        85 yo M with a chief complaints of  abdominal pain.  Going on for the past couple days.  Has a history of volvulus that required surgery.  No vomiting no bowel movement in a couple days which is normal for him.  Seen recently in the ED and diagnosed with a urinary tract infection and started on antibiotics.  CT scan with increased stool burden but no other significant pathology.  Patient will try MiraLAX cleanout.  PCP follow-up.  3:52 PM:  I have discussed the diagnosis/risks/treatment options with the patient and family and believe the pt to be eligible for discharge home to follow-up with PCP. We also discussed returning to the ED immediately if new or worsening sx occur. We discussed the sx which are most concerning (e.g., sudden worsening pain, fever, inability to tolerate by mouth) that necessitate immediate return. Medications administered to the patient during their visit and any new prescriptions provided to the patient are listed below.  Medications given during this visit Medications  sodium chloride 0.9 % bolus 1,000 mL (1,000 mLs Intravenous New Bag/Given 03/13/19 1301)  iohexol (OMNIPAQUE) 300 MG/ML solution 100 mL (100 mLs Intravenous Contrast Given 03/13/19 1517)     The patient appears reasonably screen and/or stabilized for discharge and I doubt any other medical condition or other [facility] requiring further screening, evaluation, or treatment in the ED at this time prior to discharge.    Final Clinical Impressions(s) / ED Diagnoses   Final diagnoses:  Abdominal distention    ED Discharge Orders    None       Deno Etienne, DO 03/13/19 1552

## 2019-03-14 ENCOUNTER — Telehealth: Payer: Self-pay | Admitting: Pharmacist

## 2019-03-14 DIAGNOSIS — R531 Weakness: Secondary | ICD-10-CM | POA: Diagnosis not present

## 2019-03-14 DIAGNOSIS — G20 Parkinson's disease: Secondary | ICD-10-CM | POA: Diagnosis not present

## 2019-03-14 DIAGNOSIS — I509 Heart failure, unspecified: Secondary | ICD-10-CM | POA: Diagnosis not present

## 2019-03-14 DIAGNOSIS — I110 Hypertensive heart disease with heart failure: Secondary | ICD-10-CM | POA: Diagnosis not present

## 2019-03-14 DIAGNOSIS — F0280 Dementia in other diseases classified elsewhere without behavioral disturbance: Secondary | ICD-10-CM | POA: Diagnosis not present

## 2019-03-14 DIAGNOSIS — R001 Bradycardia, unspecified: Secondary | ICD-10-CM | POA: Diagnosis not present

## 2019-03-14 NOTE — Telephone Encounter (Signed)
Daughter apprised me of an INR result obtained in ED on 13-Mar-2019. I have reviewed the visit. Continue same warfarin regimen (daugher apprised) and repeat PST FS POC INR 19-Mar-2019 and text me results.

## 2019-03-15 DIAGNOSIS — I110 Hypertensive heart disease with heart failure: Secondary | ICD-10-CM | POA: Diagnosis not present

## 2019-03-15 DIAGNOSIS — R531 Weakness: Secondary | ICD-10-CM | POA: Diagnosis not present

## 2019-03-15 DIAGNOSIS — R001 Bradycardia, unspecified: Secondary | ICD-10-CM | POA: Diagnosis not present

## 2019-03-15 DIAGNOSIS — F0280 Dementia in other diseases classified elsewhere without behavioral disturbance: Secondary | ICD-10-CM | POA: Diagnosis not present

## 2019-03-15 DIAGNOSIS — I509 Heart failure, unspecified: Secondary | ICD-10-CM | POA: Diagnosis not present

## 2019-03-15 DIAGNOSIS — G20 Parkinson's disease: Secondary | ICD-10-CM | POA: Diagnosis not present

## 2019-03-15 NOTE — Telephone Encounter (Signed)
Patient was seen in the ED. 

## 2019-03-16 ENCOUNTER — Other Ambulatory Visit: Payer: Self-pay | Admitting: *Deleted

## 2019-03-16 DIAGNOSIS — R001 Bradycardia, unspecified: Secondary | ICD-10-CM | POA: Diagnosis not present

## 2019-03-16 DIAGNOSIS — Z7902 Long term (current) use of antithrombotics/antiplatelets: Secondary | ICD-10-CM

## 2019-03-16 DIAGNOSIS — F0280 Dementia in other diseases classified elsewhere without behavioral disturbance: Secondary | ICD-10-CM | POA: Diagnosis not present

## 2019-03-16 DIAGNOSIS — I110 Hypertensive heart disease with heart failure: Secondary | ICD-10-CM | POA: Diagnosis not present

## 2019-03-16 DIAGNOSIS — G20 Parkinson's disease: Secondary | ICD-10-CM | POA: Diagnosis not present

## 2019-03-16 DIAGNOSIS — Z86711 Personal history of pulmonary embolism: Secondary | ICD-10-CM

## 2019-03-16 DIAGNOSIS — I509 Heart failure, unspecified: Secondary | ICD-10-CM | POA: Diagnosis not present

## 2019-03-16 DIAGNOSIS — R531 Weakness: Secondary | ICD-10-CM | POA: Diagnosis not present

## 2019-03-19 DIAGNOSIS — G20 Parkinson's disease: Secondary | ICD-10-CM | POA: Diagnosis not present

## 2019-03-19 DIAGNOSIS — R001 Bradycardia, unspecified: Secondary | ICD-10-CM | POA: Diagnosis not present

## 2019-03-19 DIAGNOSIS — I509 Heart failure, unspecified: Secondary | ICD-10-CM | POA: Diagnosis not present

## 2019-03-19 DIAGNOSIS — F0280 Dementia in other diseases classified elsewhere without behavioral disturbance: Secondary | ICD-10-CM | POA: Diagnosis not present

## 2019-03-19 DIAGNOSIS — R531 Weakness: Secondary | ICD-10-CM | POA: Diagnosis not present

## 2019-03-19 DIAGNOSIS — I110 Hypertensive heart disease with heart failure: Secondary | ICD-10-CM | POA: Diagnosis not present

## 2019-03-20 DIAGNOSIS — R001 Bradycardia, unspecified: Secondary | ICD-10-CM | POA: Diagnosis not present

## 2019-03-20 DIAGNOSIS — I509 Heart failure, unspecified: Secondary | ICD-10-CM | POA: Diagnosis not present

## 2019-03-20 DIAGNOSIS — R531 Weakness: Secondary | ICD-10-CM | POA: Diagnosis not present

## 2019-03-20 DIAGNOSIS — F0280 Dementia in other diseases classified elsewhere without behavioral disturbance: Secondary | ICD-10-CM | POA: Diagnosis not present

## 2019-03-20 DIAGNOSIS — I110 Hypertensive heart disease with heart failure: Secondary | ICD-10-CM | POA: Diagnosis not present

## 2019-03-20 DIAGNOSIS — G20 Parkinson's disease: Secondary | ICD-10-CM | POA: Diagnosis not present

## 2019-03-21 DIAGNOSIS — R159 Full incontinence of feces: Secondary | ICD-10-CM | POA: Diagnosis not present

## 2019-03-21 DIAGNOSIS — R32 Unspecified urinary incontinence: Secondary | ICD-10-CM | POA: Diagnosis not present

## 2019-03-21 DIAGNOSIS — I110 Hypertensive heart disease with heart failure: Secondary | ICD-10-CM | POA: Diagnosis not present

## 2019-03-21 DIAGNOSIS — I509 Heart failure, unspecified: Secondary | ICD-10-CM | POA: Diagnosis not present

## 2019-03-21 DIAGNOSIS — R269 Unspecified abnormalities of gait and mobility: Secondary | ICD-10-CM | POA: Diagnosis not present

## 2019-03-21 DIAGNOSIS — R001 Bradycardia, unspecified: Secondary | ICD-10-CM | POA: Diagnosis not present

## 2019-03-21 DIAGNOSIS — F0280 Dementia in other diseases classified elsewhere without behavioral disturbance: Secondary | ICD-10-CM | POA: Diagnosis not present

## 2019-03-21 DIAGNOSIS — R531 Weakness: Secondary | ICD-10-CM | POA: Diagnosis not present

## 2019-03-21 DIAGNOSIS — Z515 Encounter for palliative care: Secondary | ICD-10-CM | POA: Diagnosis not present

## 2019-03-21 DIAGNOSIS — M1A00X0 Idiopathic chronic gout, unspecified site, without tophus (tophi): Secondary | ICD-10-CM | POA: Diagnosis not present

## 2019-03-21 DIAGNOSIS — G20 Parkinson's disease: Secondary | ICD-10-CM | POA: Diagnosis not present

## 2019-03-22 DIAGNOSIS — I110 Hypertensive heart disease with heart failure: Secondary | ICD-10-CM | POA: Diagnosis not present

## 2019-03-22 DIAGNOSIS — R531 Weakness: Secondary | ICD-10-CM | POA: Diagnosis not present

## 2019-03-22 DIAGNOSIS — R001 Bradycardia, unspecified: Secondary | ICD-10-CM | POA: Diagnosis not present

## 2019-03-22 DIAGNOSIS — G20 Parkinson's disease: Secondary | ICD-10-CM | POA: Diagnosis not present

## 2019-03-22 DIAGNOSIS — F0280 Dementia in other diseases classified elsewhere without behavioral disturbance: Secondary | ICD-10-CM | POA: Diagnosis not present

## 2019-03-22 DIAGNOSIS — I509 Heart failure, unspecified: Secondary | ICD-10-CM | POA: Diagnosis not present

## 2019-03-26 DIAGNOSIS — Z7901 Long term (current) use of anticoagulants: Secondary | ICD-10-CM | POA: Diagnosis not present

## 2019-03-26 DIAGNOSIS — R001 Bradycardia, unspecified: Secondary | ICD-10-CM | POA: Diagnosis not present

## 2019-03-26 DIAGNOSIS — I110 Hypertensive heart disease with heart failure: Secondary | ICD-10-CM | POA: Diagnosis not present

## 2019-03-26 DIAGNOSIS — I2699 Other pulmonary embolism without acute cor pulmonale: Secondary | ICD-10-CM | POA: Diagnosis not present

## 2019-03-26 DIAGNOSIS — R531 Weakness: Secondary | ICD-10-CM | POA: Diagnosis not present

## 2019-03-26 DIAGNOSIS — F0280 Dementia in other diseases classified elsewhere without behavioral disturbance: Secondary | ICD-10-CM | POA: Diagnosis not present

## 2019-03-26 DIAGNOSIS — I509 Heart failure, unspecified: Secondary | ICD-10-CM | POA: Diagnosis not present

## 2019-03-26 DIAGNOSIS — G20 Parkinson's disease: Secondary | ICD-10-CM | POA: Diagnosis not present

## 2019-03-27 DIAGNOSIS — F0280 Dementia in other diseases classified elsewhere without behavioral disturbance: Secondary | ICD-10-CM | POA: Diagnosis not present

## 2019-03-27 DIAGNOSIS — R531 Weakness: Secondary | ICD-10-CM | POA: Diagnosis not present

## 2019-03-27 DIAGNOSIS — I110 Hypertensive heart disease with heart failure: Secondary | ICD-10-CM | POA: Diagnosis not present

## 2019-03-27 DIAGNOSIS — R001 Bradycardia, unspecified: Secondary | ICD-10-CM | POA: Diagnosis not present

## 2019-03-27 DIAGNOSIS — I509 Heart failure, unspecified: Secondary | ICD-10-CM | POA: Diagnosis not present

## 2019-03-27 DIAGNOSIS — G20 Parkinson's disease: Secondary | ICD-10-CM | POA: Diagnosis not present

## 2019-03-28 DIAGNOSIS — F0280 Dementia in other diseases classified elsewhere without behavioral disturbance: Secondary | ICD-10-CM | POA: Diagnosis not present

## 2019-03-28 DIAGNOSIS — I110 Hypertensive heart disease with heart failure: Secondary | ICD-10-CM | POA: Diagnosis not present

## 2019-03-28 DIAGNOSIS — G20 Parkinson's disease: Secondary | ICD-10-CM | POA: Diagnosis not present

## 2019-03-28 DIAGNOSIS — R531 Weakness: Secondary | ICD-10-CM | POA: Diagnosis not present

## 2019-03-28 DIAGNOSIS — I509 Heart failure, unspecified: Secondary | ICD-10-CM | POA: Diagnosis not present

## 2019-03-28 DIAGNOSIS — R001 Bradycardia, unspecified: Secondary | ICD-10-CM | POA: Diagnosis not present

## 2019-03-29 DIAGNOSIS — R001 Bradycardia, unspecified: Secondary | ICD-10-CM | POA: Diagnosis not present

## 2019-03-29 DIAGNOSIS — I509 Heart failure, unspecified: Secondary | ICD-10-CM | POA: Diagnosis not present

## 2019-03-29 DIAGNOSIS — I110 Hypertensive heart disease with heart failure: Secondary | ICD-10-CM | POA: Diagnosis not present

## 2019-03-29 DIAGNOSIS — F0280 Dementia in other diseases classified elsewhere without behavioral disturbance: Secondary | ICD-10-CM | POA: Diagnosis not present

## 2019-03-29 DIAGNOSIS — R531 Weakness: Secondary | ICD-10-CM | POA: Diagnosis not present

## 2019-03-29 DIAGNOSIS — G20 Parkinson's disease: Secondary | ICD-10-CM | POA: Diagnosis not present

## 2019-03-30 DIAGNOSIS — I110 Hypertensive heart disease with heart failure: Secondary | ICD-10-CM | POA: Diagnosis not present

## 2019-03-30 DIAGNOSIS — I509 Heart failure, unspecified: Secondary | ICD-10-CM | POA: Diagnosis not present

## 2019-03-30 DIAGNOSIS — F0280 Dementia in other diseases classified elsewhere without behavioral disturbance: Secondary | ICD-10-CM | POA: Diagnosis not present

## 2019-03-30 DIAGNOSIS — R001 Bradycardia, unspecified: Secondary | ICD-10-CM | POA: Diagnosis not present

## 2019-03-30 DIAGNOSIS — R531 Weakness: Secondary | ICD-10-CM | POA: Diagnosis not present

## 2019-03-30 DIAGNOSIS — G20 Parkinson's disease: Secondary | ICD-10-CM | POA: Diagnosis not present

## 2019-04-02 DIAGNOSIS — I110 Hypertensive heart disease with heart failure: Secondary | ICD-10-CM | POA: Diagnosis not present

## 2019-04-02 DIAGNOSIS — F0280 Dementia in other diseases classified elsewhere without behavioral disturbance: Secondary | ICD-10-CM | POA: Diagnosis not present

## 2019-04-02 DIAGNOSIS — G20 Parkinson's disease: Secondary | ICD-10-CM | POA: Diagnosis not present

## 2019-04-02 DIAGNOSIS — I509 Heart failure, unspecified: Secondary | ICD-10-CM | POA: Diagnosis not present

## 2019-04-02 DIAGNOSIS — R531 Weakness: Secondary | ICD-10-CM | POA: Diagnosis not present

## 2019-04-02 DIAGNOSIS — R001 Bradycardia, unspecified: Secondary | ICD-10-CM | POA: Diagnosis not present

## 2019-04-03 DIAGNOSIS — F0280 Dementia in other diseases classified elsewhere without behavioral disturbance: Secondary | ICD-10-CM | POA: Diagnosis not present

## 2019-04-03 DIAGNOSIS — I110 Hypertensive heart disease with heart failure: Secondary | ICD-10-CM | POA: Diagnosis not present

## 2019-04-03 DIAGNOSIS — R531 Weakness: Secondary | ICD-10-CM | POA: Diagnosis not present

## 2019-04-03 DIAGNOSIS — R001 Bradycardia, unspecified: Secondary | ICD-10-CM | POA: Diagnosis not present

## 2019-04-03 DIAGNOSIS — G20 Parkinson's disease: Secondary | ICD-10-CM | POA: Diagnosis not present

## 2019-04-03 DIAGNOSIS — I509 Heart failure, unspecified: Secondary | ICD-10-CM | POA: Diagnosis not present

## 2019-04-04 DIAGNOSIS — R001 Bradycardia, unspecified: Secondary | ICD-10-CM | POA: Diagnosis not present

## 2019-04-04 DIAGNOSIS — I509 Heart failure, unspecified: Secondary | ICD-10-CM | POA: Diagnosis not present

## 2019-04-04 DIAGNOSIS — I110 Hypertensive heart disease with heart failure: Secondary | ICD-10-CM | POA: Diagnosis not present

## 2019-04-04 DIAGNOSIS — F0280 Dementia in other diseases classified elsewhere without behavioral disturbance: Secondary | ICD-10-CM | POA: Diagnosis not present

## 2019-04-04 DIAGNOSIS — G20 Parkinson's disease: Secondary | ICD-10-CM | POA: Diagnosis not present

## 2019-04-04 DIAGNOSIS — R531 Weakness: Secondary | ICD-10-CM | POA: Diagnosis not present

## 2019-04-05 DIAGNOSIS — R001 Bradycardia, unspecified: Secondary | ICD-10-CM | POA: Diagnosis not present

## 2019-04-05 DIAGNOSIS — F0280 Dementia in other diseases classified elsewhere without behavioral disturbance: Secondary | ICD-10-CM | POA: Diagnosis not present

## 2019-04-05 DIAGNOSIS — I110 Hypertensive heart disease with heart failure: Secondary | ICD-10-CM | POA: Diagnosis not present

## 2019-04-05 DIAGNOSIS — G20 Parkinson's disease: Secondary | ICD-10-CM | POA: Diagnosis not present

## 2019-04-05 DIAGNOSIS — I509 Heart failure, unspecified: Secondary | ICD-10-CM | POA: Diagnosis not present

## 2019-04-05 DIAGNOSIS — R531 Weakness: Secondary | ICD-10-CM | POA: Diagnosis not present

## 2019-04-06 DIAGNOSIS — G20 Parkinson's disease: Secondary | ICD-10-CM | POA: Diagnosis not present

## 2019-04-06 DIAGNOSIS — R531 Weakness: Secondary | ICD-10-CM | POA: Diagnosis not present

## 2019-04-06 DIAGNOSIS — I110 Hypertensive heart disease with heart failure: Secondary | ICD-10-CM | POA: Diagnosis not present

## 2019-04-06 DIAGNOSIS — R001 Bradycardia, unspecified: Secondary | ICD-10-CM | POA: Diagnosis not present

## 2019-04-06 DIAGNOSIS — F0280 Dementia in other diseases classified elsewhere without behavioral disturbance: Secondary | ICD-10-CM | POA: Diagnosis not present

## 2019-04-06 DIAGNOSIS — I509 Heart failure, unspecified: Secondary | ICD-10-CM | POA: Diagnosis not present

## 2019-04-09 DIAGNOSIS — I110 Hypertensive heart disease with heart failure: Secondary | ICD-10-CM | POA: Diagnosis not present

## 2019-04-09 DIAGNOSIS — I509 Heart failure, unspecified: Secondary | ICD-10-CM | POA: Diagnosis not present

## 2019-04-09 DIAGNOSIS — F0280 Dementia in other diseases classified elsewhere without behavioral disturbance: Secondary | ICD-10-CM | POA: Diagnosis not present

## 2019-04-09 DIAGNOSIS — R001 Bradycardia, unspecified: Secondary | ICD-10-CM | POA: Diagnosis not present

## 2019-04-09 DIAGNOSIS — G20 Parkinson's disease: Secondary | ICD-10-CM | POA: Diagnosis not present

## 2019-04-09 DIAGNOSIS — R531 Weakness: Secondary | ICD-10-CM | POA: Diagnosis not present

## 2019-04-10 ENCOUNTER — Telehealth: Payer: Self-pay

## 2019-04-10 DIAGNOSIS — F0280 Dementia in other diseases classified elsewhere without behavioral disturbance: Secondary | ICD-10-CM | POA: Diagnosis not present

## 2019-04-10 DIAGNOSIS — G20 Parkinson's disease: Secondary | ICD-10-CM | POA: Diagnosis not present

## 2019-04-10 DIAGNOSIS — R001 Bradycardia, unspecified: Secondary | ICD-10-CM | POA: Diagnosis not present

## 2019-04-10 DIAGNOSIS — R531 Weakness: Secondary | ICD-10-CM | POA: Diagnosis not present

## 2019-04-10 DIAGNOSIS — I509 Heart failure, unspecified: Secondary | ICD-10-CM | POA: Diagnosis not present

## 2019-04-10 DIAGNOSIS — I110 Hypertensive heart disease with heart failure: Secondary | ICD-10-CM | POA: Diagnosis not present

## 2019-04-10 NOTE — Telephone Encounter (Signed)
Called daughter, Lilli Light - stated pt may have another UTI; stated pt had a bowel movement and his penis was contaminated. Stated urine has an odor. And he has same symptoms as last time. She prefers not to bring him in. Stated she had collected urine specimen before but she does not have a clean bag for condom cath. Wants to know if abx could be called in? Thanks

## 2019-04-10 NOTE — Telephone Encounter (Signed)
Pt's daughter requesting to speak with a nurse about getting med for UTI. Please call back.

## 2019-04-11 DIAGNOSIS — I509 Heart failure, unspecified: Secondary | ICD-10-CM | POA: Diagnosis not present

## 2019-04-11 DIAGNOSIS — R531 Weakness: Secondary | ICD-10-CM | POA: Diagnosis not present

## 2019-04-11 DIAGNOSIS — F0280 Dementia in other diseases classified elsewhere without behavioral disturbance: Secondary | ICD-10-CM | POA: Diagnosis not present

## 2019-04-11 DIAGNOSIS — G20 Parkinson's disease: Secondary | ICD-10-CM | POA: Diagnosis not present

## 2019-04-11 DIAGNOSIS — R001 Bradycardia, unspecified: Secondary | ICD-10-CM | POA: Diagnosis not present

## 2019-04-11 DIAGNOSIS — I110 Hypertensive heart disease with heart failure: Secondary | ICD-10-CM | POA: Diagnosis not present

## 2019-04-11 MED ORDER — CEPHALEXIN 500 MG PO CAPS: 500.0000 mg | ORAL_CAPSULE | Freq: Two times a day (BID) | ORAL | 0 refills | Status: DC

## 2019-04-11 NOTE — Telephone Encounter (Signed)
I have sent in a 5 day course of keflex (based on last UCx) (500mg  twice daily). Please have him take this and let me know if symptoms do not improve.

## 2019-04-11 NOTE — Telephone Encounter (Signed)
Pt's daughter called / informed of rx for Keflex (5 day course) and to call if pt's symptoms do not improve- stated she will.

## 2019-04-12 DIAGNOSIS — I509 Heart failure, unspecified: Secondary | ICD-10-CM | POA: Diagnosis not present

## 2019-04-12 DIAGNOSIS — F0280 Dementia in other diseases classified elsewhere without behavioral disturbance: Secondary | ICD-10-CM | POA: Diagnosis not present

## 2019-04-12 DIAGNOSIS — I110 Hypertensive heart disease with heart failure: Secondary | ICD-10-CM | POA: Diagnosis not present

## 2019-04-12 DIAGNOSIS — R001 Bradycardia, unspecified: Secondary | ICD-10-CM | POA: Diagnosis not present

## 2019-04-12 DIAGNOSIS — G20 Parkinson's disease: Secondary | ICD-10-CM | POA: Diagnosis not present

## 2019-04-12 DIAGNOSIS — R531 Weakness: Secondary | ICD-10-CM | POA: Diagnosis not present

## 2019-04-13 DIAGNOSIS — I509 Heart failure, unspecified: Secondary | ICD-10-CM | POA: Diagnosis not present

## 2019-04-13 DIAGNOSIS — R531 Weakness: Secondary | ICD-10-CM | POA: Diagnosis not present

## 2019-04-13 DIAGNOSIS — I110 Hypertensive heart disease with heart failure: Secondary | ICD-10-CM | POA: Diagnosis not present

## 2019-04-13 DIAGNOSIS — R001 Bradycardia, unspecified: Secondary | ICD-10-CM | POA: Diagnosis not present

## 2019-04-13 DIAGNOSIS — F0280 Dementia in other diseases classified elsewhere without behavioral disturbance: Secondary | ICD-10-CM | POA: Diagnosis not present

## 2019-04-13 DIAGNOSIS — G20 Parkinson's disease: Secondary | ICD-10-CM | POA: Diagnosis not present

## 2019-04-16 DIAGNOSIS — F0280 Dementia in other diseases classified elsewhere without behavioral disturbance: Secondary | ICD-10-CM | POA: Diagnosis not present

## 2019-04-16 DIAGNOSIS — R001 Bradycardia, unspecified: Secondary | ICD-10-CM | POA: Diagnosis not present

## 2019-04-16 DIAGNOSIS — I509 Heart failure, unspecified: Secondary | ICD-10-CM | POA: Diagnosis not present

## 2019-04-16 DIAGNOSIS — R531 Weakness: Secondary | ICD-10-CM | POA: Diagnosis not present

## 2019-04-16 DIAGNOSIS — I110 Hypertensive heart disease with heart failure: Secondary | ICD-10-CM | POA: Diagnosis not present

## 2019-04-16 DIAGNOSIS — G20 Parkinson's disease: Secondary | ICD-10-CM | POA: Diagnosis not present

## 2019-04-17 DIAGNOSIS — R001 Bradycardia, unspecified: Secondary | ICD-10-CM | POA: Diagnosis not present

## 2019-04-17 DIAGNOSIS — I509 Heart failure, unspecified: Secondary | ICD-10-CM | POA: Diagnosis not present

## 2019-04-17 DIAGNOSIS — F0280 Dementia in other diseases classified elsewhere without behavioral disturbance: Secondary | ICD-10-CM | POA: Diagnosis not present

## 2019-04-17 DIAGNOSIS — G20 Parkinson's disease: Secondary | ICD-10-CM | POA: Diagnosis not present

## 2019-04-17 DIAGNOSIS — I110 Hypertensive heart disease with heart failure: Secondary | ICD-10-CM | POA: Diagnosis not present

## 2019-04-17 DIAGNOSIS — R531 Weakness: Secondary | ICD-10-CM | POA: Diagnosis not present

## 2019-04-18 DIAGNOSIS — R531 Weakness: Secondary | ICD-10-CM | POA: Diagnosis not present

## 2019-04-18 DIAGNOSIS — R001 Bradycardia, unspecified: Secondary | ICD-10-CM | POA: Diagnosis not present

## 2019-04-18 DIAGNOSIS — I110 Hypertensive heart disease with heart failure: Secondary | ICD-10-CM | POA: Diagnosis not present

## 2019-04-18 DIAGNOSIS — G20 Parkinson's disease: Secondary | ICD-10-CM | POA: Diagnosis not present

## 2019-04-18 DIAGNOSIS — I509 Heart failure, unspecified: Secondary | ICD-10-CM | POA: Diagnosis not present

## 2019-04-18 DIAGNOSIS — F0280 Dementia in other diseases classified elsewhere without behavioral disturbance: Secondary | ICD-10-CM | POA: Diagnosis not present

## 2019-04-19 DIAGNOSIS — I509 Heart failure, unspecified: Secondary | ICD-10-CM | POA: Diagnosis not present

## 2019-04-19 DIAGNOSIS — R001 Bradycardia, unspecified: Secondary | ICD-10-CM | POA: Diagnosis not present

## 2019-04-19 DIAGNOSIS — G20 Parkinson's disease: Secondary | ICD-10-CM | POA: Diagnosis not present

## 2019-04-19 DIAGNOSIS — F0280 Dementia in other diseases classified elsewhere without behavioral disturbance: Secondary | ICD-10-CM | POA: Diagnosis not present

## 2019-04-19 DIAGNOSIS — I110 Hypertensive heart disease with heart failure: Secondary | ICD-10-CM | POA: Diagnosis not present

## 2019-04-19 DIAGNOSIS — R531 Weakness: Secondary | ICD-10-CM | POA: Diagnosis not present

## 2019-04-21 DIAGNOSIS — Z515 Encounter for palliative care: Secondary | ICD-10-CM | POA: Diagnosis not present

## 2019-04-21 DIAGNOSIS — G20 Parkinson's disease: Secondary | ICD-10-CM | POA: Diagnosis not present

## 2019-04-21 DIAGNOSIS — R269 Unspecified abnormalities of gait and mobility: Secondary | ICD-10-CM | POA: Diagnosis not present

## 2019-04-21 DIAGNOSIS — F0280 Dementia in other diseases classified elsewhere without behavioral disturbance: Secondary | ICD-10-CM | POA: Diagnosis not present

## 2019-04-21 DIAGNOSIS — R531 Weakness: Secondary | ICD-10-CM | POA: Diagnosis not present

## 2019-04-21 DIAGNOSIS — R001 Bradycardia, unspecified: Secondary | ICD-10-CM | POA: Diagnosis not present

## 2019-04-21 DIAGNOSIS — R32 Unspecified urinary incontinence: Secondary | ICD-10-CM | POA: Diagnosis not present

## 2019-04-21 DIAGNOSIS — I110 Hypertensive heart disease with heart failure: Secondary | ICD-10-CM | POA: Diagnosis not present

## 2019-04-21 DIAGNOSIS — M1A00X0 Idiopathic chronic gout, unspecified site, without tophus (tophi): Secondary | ICD-10-CM | POA: Diagnosis not present

## 2019-04-21 DIAGNOSIS — I509 Heart failure, unspecified: Secondary | ICD-10-CM | POA: Diagnosis not present

## 2019-04-21 DIAGNOSIS — R159 Full incontinence of feces: Secondary | ICD-10-CM | POA: Diagnosis not present

## 2019-04-24 DIAGNOSIS — G20 Parkinson's disease: Secondary | ICD-10-CM | POA: Diagnosis not present

## 2019-04-24 DIAGNOSIS — I509 Heart failure, unspecified: Secondary | ICD-10-CM | POA: Diagnosis not present

## 2019-04-24 DIAGNOSIS — R531 Weakness: Secondary | ICD-10-CM | POA: Diagnosis not present

## 2019-04-24 DIAGNOSIS — F0280 Dementia in other diseases classified elsewhere without behavioral disturbance: Secondary | ICD-10-CM | POA: Diagnosis not present

## 2019-04-24 DIAGNOSIS — R001 Bradycardia, unspecified: Secondary | ICD-10-CM | POA: Diagnosis not present

## 2019-04-24 DIAGNOSIS — I110 Hypertensive heart disease with heart failure: Secondary | ICD-10-CM | POA: Diagnosis not present

## 2019-04-25 ENCOUNTER — Telehealth: Payer: Self-pay | Admitting: Pharmacist

## 2019-04-25 DIAGNOSIS — I110 Hypertensive heart disease with heart failure: Secondary | ICD-10-CM | POA: Diagnosis not present

## 2019-04-25 DIAGNOSIS — F0280 Dementia in other diseases classified elsewhere without behavioral disturbance: Secondary | ICD-10-CM | POA: Diagnosis not present

## 2019-04-25 DIAGNOSIS — R001 Bradycardia, unspecified: Secondary | ICD-10-CM | POA: Diagnosis not present

## 2019-04-25 DIAGNOSIS — G20 Parkinson's disease: Secondary | ICD-10-CM | POA: Diagnosis not present

## 2019-04-25 DIAGNOSIS — R531 Weakness: Secondary | ICD-10-CM | POA: Diagnosis not present

## 2019-04-25 DIAGNOSIS — I509 Heart failure, unspecified: Secondary | ICD-10-CM | POA: Diagnosis not present

## 2019-04-25 NOTE — Telephone Encounter (Signed)
Was texted by [name], patient's daughter with POC PST FS INR collected 24-Apr-2019 = 2.2. on 27.5mg  warfarin/wk. Advised to CONTINUE this regimen and re-collect POC PST FS INR in 2 weeks.

## 2019-04-26 DIAGNOSIS — G20 Parkinson's disease: Secondary | ICD-10-CM | POA: Diagnosis not present

## 2019-04-26 DIAGNOSIS — R001 Bradycardia, unspecified: Secondary | ICD-10-CM | POA: Diagnosis not present

## 2019-04-26 DIAGNOSIS — I110 Hypertensive heart disease with heart failure: Secondary | ICD-10-CM | POA: Diagnosis not present

## 2019-04-26 DIAGNOSIS — R531 Weakness: Secondary | ICD-10-CM | POA: Diagnosis not present

## 2019-04-26 DIAGNOSIS — I509 Heart failure, unspecified: Secondary | ICD-10-CM | POA: Diagnosis not present

## 2019-04-26 DIAGNOSIS — F0280 Dementia in other diseases classified elsewhere without behavioral disturbance: Secondary | ICD-10-CM | POA: Diagnosis not present

## 2019-04-27 DIAGNOSIS — R531 Weakness: Secondary | ICD-10-CM | POA: Diagnosis not present

## 2019-04-27 DIAGNOSIS — I509 Heart failure, unspecified: Secondary | ICD-10-CM | POA: Diagnosis not present

## 2019-04-27 DIAGNOSIS — R001 Bradycardia, unspecified: Secondary | ICD-10-CM | POA: Diagnosis not present

## 2019-04-27 DIAGNOSIS — G20 Parkinson's disease: Secondary | ICD-10-CM | POA: Diagnosis not present

## 2019-04-27 DIAGNOSIS — F0280 Dementia in other diseases classified elsewhere without behavioral disturbance: Secondary | ICD-10-CM | POA: Diagnosis not present

## 2019-04-27 DIAGNOSIS — I110 Hypertensive heart disease with heart failure: Secondary | ICD-10-CM | POA: Diagnosis not present

## 2019-04-30 DIAGNOSIS — F0280 Dementia in other diseases classified elsewhere without behavioral disturbance: Secondary | ICD-10-CM | POA: Diagnosis not present

## 2019-04-30 DIAGNOSIS — R001 Bradycardia, unspecified: Secondary | ICD-10-CM | POA: Diagnosis not present

## 2019-04-30 DIAGNOSIS — I509 Heart failure, unspecified: Secondary | ICD-10-CM | POA: Diagnosis not present

## 2019-04-30 DIAGNOSIS — G20 Parkinson's disease: Secondary | ICD-10-CM | POA: Diagnosis not present

## 2019-04-30 DIAGNOSIS — R531 Weakness: Secondary | ICD-10-CM | POA: Diagnosis not present

## 2019-04-30 DIAGNOSIS — I110 Hypertensive heart disease with heart failure: Secondary | ICD-10-CM | POA: Diagnosis not present

## 2019-05-01 DIAGNOSIS — F0280 Dementia in other diseases classified elsewhere without behavioral disturbance: Secondary | ICD-10-CM | POA: Diagnosis not present

## 2019-05-01 DIAGNOSIS — I110 Hypertensive heart disease with heart failure: Secondary | ICD-10-CM | POA: Diagnosis not present

## 2019-05-01 DIAGNOSIS — I509 Heart failure, unspecified: Secondary | ICD-10-CM | POA: Diagnosis not present

## 2019-05-01 DIAGNOSIS — G20 Parkinson's disease: Secondary | ICD-10-CM | POA: Diagnosis not present

## 2019-05-01 DIAGNOSIS — R001 Bradycardia, unspecified: Secondary | ICD-10-CM | POA: Diagnosis not present

## 2019-05-01 DIAGNOSIS — R531 Weakness: Secondary | ICD-10-CM | POA: Diagnosis not present

## 2019-05-02 DIAGNOSIS — R531 Weakness: Secondary | ICD-10-CM | POA: Diagnosis not present

## 2019-05-02 DIAGNOSIS — F0280 Dementia in other diseases classified elsewhere without behavioral disturbance: Secondary | ICD-10-CM | POA: Diagnosis not present

## 2019-05-02 DIAGNOSIS — I110 Hypertensive heart disease with heart failure: Secondary | ICD-10-CM | POA: Diagnosis not present

## 2019-05-02 DIAGNOSIS — G20 Parkinson's disease: Secondary | ICD-10-CM | POA: Diagnosis not present

## 2019-05-02 DIAGNOSIS — R001 Bradycardia, unspecified: Secondary | ICD-10-CM | POA: Diagnosis not present

## 2019-05-02 DIAGNOSIS — I509 Heart failure, unspecified: Secondary | ICD-10-CM | POA: Diagnosis not present

## 2019-05-03 DIAGNOSIS — I509 Heart failure, unspecified: Secondary | ICD-10-CM | POA: Diagnosis not present

## 2019-05-03 DIAGNOSIS — G20 Parkinson's disease: Secondary | ICD-10-CM | POA: Diagnosis not present

## 2019-05-03 DIAGNOSIS — R531 Weakness: Secondary | ICD-10-CM | POA: Diagnosis not present

## 2019-05-03 DIAGNOSIS — I110 Hypertensive heart disease with heart failure: Secondary | ICD-10-CM | POA: Diagnosis not present

## 2019-05-03 DIAGNOSIS — R001 Bradycardia, unspecified: Secondary | ICD-10-CM | POA: Diagnosis not present

## 2019-05-03 DIAGNOSIS — F0280 Dementia in other diseases classified elsewhere without behavioral disturbance: Secondary | ICD-10-CM | POA: Diagnosis not present

## 2019-05-08 DIAGNOSIS — R531 Weakness: Secondary | ICD-10-CM | POA: Diagnosis not present

## 2019-05-08 DIAGNOSIS — F0280 Dementia in other diseases classified elsewhere without behavioral disturbance: Secondary | ICD-10-CM | POA: Diagnosis not present

## 2019-05-08 DIAGNOSIS — R001 Bradycardia, unspecified: Secondary | ICD-10-CM | POA: Diagnosis not present

## 2019-05-08 DIAGNOSIS — I110 Hypertensive heart disease with heart failure: Secondary | ICD-10-CM | POA: Diagnosis not present

## 2019-05-08 DIAGNOSIS — G20 Parkinson's disease: Secondary | ICD-10-CM | POA: Diagnosis not present

## 2019-05-08 DIAGNOSIS — I509 Heart failure, unspecified: Secondary | ICD-10-CM | POA: Diagnosis not present

## 2019-05-09 ENCOUNTER — Telehealth: Payer: Self-pay | Admitting: Pharmacist

## 2019-05-09 DIAGNOSIS — I509 Heart failure, unspecified: Secondary | ICD-10-CM | POA: Diagnosis not present

## 2019-05-09 DIAGNOSIS — G20 Parkinson's disease: Secondary | ICD-10-CM | POA: Diagnosis not present

## 2019-05-09 DIAGNOSIS — R531 Weakness: Secondary | ICD-10-CM | POA: Diagnosis not present

## 2019-05-09 DIAGNOSIS — I110 Hypertensive heart disease with heart failure: Secondary | ICD-10-CM | POA: Diagnosis not present

## 2019-05-09 DIAGNOSIS — R001 Bradycardia, unspecified: Secondary | ICD-10-CM | POA: Diagnosis not present

## 2019-05-09 DIAGNOSIS — F0280 Dementia in other diseases classified elsewhere without behavioral disturbance: Secondary | ICD-10-CM | POA: Diagnosis not present

## 2019-05-09 NOTE — Telephone Encounter (Signed)
Texted by patient's daughter on my cell phone at 1821h on 09-May-2019 reporting PST FS POC INR 1.8 on 27.5mg  warfarin/wk. Advised to INCREASE to 30mg  warfarin/wk., as 5mg  on Su/M/Tu/We/Fri; 2.5mg  on Th/Saturdays. Repeat PSF FS POC INR in 2 weeks.

## 2019-05-10 DIAGNOSIS — G20 Parkinson's disease: Secondary | ICD-10-CM | POA: Diagnosis not present

## 2019-05-10 DIAGNOSIS — R001 Bradycardia, unspecified: Secondary | ICD-10-CM | POA: Diagnosis not present

## 2019-05-10 DIAGNOSIS — I509 Heart failure, unspecified: Secondary | ICD-10-CM | POA: Diagnosis not present

## 2019-05-10 DIAGNOSIS — R531 Weakness: Secondary | ICD-10-CM | POA: Diagnosis not present

## 2019-05-10 DIAGNOSIS — I110 Hypertensive heart disease with heart failure: Secondary | ICD-10-CM | POA: Diagnosis not present

## 2019-05-10 DIAGNOSIS — F0280 Dementia in other diseases classified elsewhere without behavioral disturbance: Secondary | ICD-10-CM | POA: Diagnosis not present

## 2019-05-14 ENCOUNTER — Ambulatory Visit: Payer: Medicare HMO | Admitting: Neurology

## 2019-05-14 DIAGNOSIS — G20 Parkinson's disease: Secondary | ICD-10-CM | POA: Diagnosis not present

## 2019-05-14 DIAGNOSIS — I509 Heart failure, unspecified: Secondary | ICD-10-CM | POA: Diagnosis not present

## 2019-05-14 DIAGNOSIS — R001 Bradycardia, unspecified: Secondary | ICD-10-CM | POA: Diagnosis not present

## 2019-05-14 DIAGNOSIS — R531 Weakness: Secondary | ICD-10-CM | POA: Diagnosis not present

## 2019-05-14 DIAGNOSIS — I110 Hypertensive heart disease with heart failure: Secondary | ICD-10-CM | POA: Diagnosis not present

## 2019-05-14 DIAGNOSIS — F0280 Dementia in other diseases classified elsewhere without behavioral disturbance: Secondary | ICD-10-CM | POA: Diagnosis not present

## 2019-05-15 DIAGNOSIS — R001 Bradycardia, unspecified: Secondary | ICD-10-CM | POA: Diagnosis not present

## 2019-05-15 DIAGNOSIS — I509 Heart failure, unspecified: Secondary | ICD-10-CM | POA: Diagnosis not present

## 2019-05-15 DIAGNOSIS — F0280 Dementia in other diseases classified elsewhere without behavioral disturbance: Secondary | ICD-10-CM | POA: Diagnosis not present

## 2019-05-15 DIAGNOSIS — I110 Hypertensive heart disease with heart failure: Secondary | ICD-10-CM | POA: Diagnosis not present

## 2019-05-15 DIAGNOSIS — G20 Parkinson's disease: Secondary | ICD-10-CM | POA: Diagnosis not present

## 2019-05-15 DIAGNOSIS — R531 Weakness: Secondary | ICD-10-CM | POA: Diagnosis not present

## 2019-05-16 DIAGNOSIS — R001 Bradycardia, unspecified: Secondary | ICD-10-CM | POA: Diagnosis not present

## 2019-05-16 DIAGNOSIS — G20 Parkinson's disease: Secondary | ICD-10-CM | POA: Diagnosis not present

## 2019-05-16 DIAGNOSIS — I110 Hypertensive heart disease with heart failure: Secondary | ICD-10-CM | POA: Diagnosis not present

## 2019-05-16 DIAGNOSIS — I509 Heart failure, unspecified: Secondary | ICD-10-CM | POA: Diagnosis not present

## 2019-05-16 DIAGNOSIS — F0280 Dementia in other diseases classified elsewhere without behavioral disturbance: Secondary | ICD-10-CM | POA: Diagnosis not present

## 2019-05-16 DIAGNOSIS — R531 Weakness: Secondary | ICD-10-CM | POA: Diagnosis not present

## 2019-05-17 DIAGNOSIS — I110 Hypertensive heart disease with heart failure: Secondary | ICD-10-CM | POA: Diagnosis not present

## 2019-05-17 DIAGNOSIS — F0280 Dementia in other diseases classified elsewhere without behavioral disturbance: Secondary | ICD-10-CM | POA: Diagnosis not present

## 2019-05-17 DIAGNOSIS — R001 Bradycardia, unspecified: Secondary | ICD-10-CM | POA: Diagnosis not present

## 2019-05-17 DIAGNOSIS — I509 Heart failure, unspecified: Secondary | ICD-10-CM | POA: Diagnosis not present

## 2019-05-17 DIAGNOSIS — R531 Weakness: Secondary | ICD-10-CM | POA: Diagnosis not present

## 2019-05-17 DIAGNOSIS — G20 Parkinson's disease: Secondary | ICD-10-CM | POA: Diagnosis not present

## 2019-05-22 DIAGNOSIS — Z515 Encounter for palliative care: Secondary | ICD-10-CM | POA: Diagnosis not present

## 2019-05-22 DIAGNOSIS — R32 Unspecified urinary incontinence: Secondary | ICD-10-CM | POA: Diagnosis not present

## 2019-05-22 DIAGNOSIS — G20 Parkinson's disease: Secondary | ICD-10-CM | POA: Diagnosis not present

## 2019-05-22 DIAGNOSIS — R531 Weakness: Secondary | ICD-10-CM | POA: Diagnosis not present

## 2019-05-22 DIAGNOSIS — M1A00X0 Idiopathic chronic gout, unspecified site, without tophus (tophi): Secondary | ICD-10-CM | POA: Diagnosis not present

## 2019-05-22 DIAGNOSIS — R159 Full incontinence of feces: Secondary | ICD-10-CM | POA: Diagnosis not present

## 2019-05-22 DIAGNOSIS — R001 Bradycardia, unspecified: Secondary | ICD-10-CM | POA: Diagnosis not present

## 2019-05-22 DIAGNOSIS — I509 Heart failure, unspecified: Secondary | ICD-10-CM | POA: Diagnosis not present

## 2019-05-22 DIAGNOSIS — I110 Hypertensive heart disease with heart failure: Secondary | ICD-10-CM | POA: Diagnosis not present

## 2019-05-22 DIAGNOSIS — F0280 Dementia in other diseases classified elsewhere without behavioral disturbance: Secondary | ICD-10-CM | POA: Diagnosis not present

## 2019-05-22 DIAGNOSIS — R269 Unspecified abnormalities of gait and mobility: Secondary | ICD-10-CM | POA: Diagnosis not present

## 2019-05-23 DIAGNOSIS — F0280 Dementia in other diseases classified elsewhere without behavioral disturbance: Secondary | ICD-10-CM | POA: Diagnosis not present

## 2019-05-23 DIAGNOSIS — I110 Hypertensive heart disease with heart failure: Secondary | ICD-10-CM | POA: Diagnosis not present

## 2019-05-23 DIAGNOSIS — G20 Parkinson's disease: Secondary | ICD-10-CM | POA: Diagnosis not present

## 2019-05-23 DIAGNOSIS — I509 Heart failure, unspecified: Secondary | ICD-10-CM | POA: Diagnosis not present

## 2019-05-23 DIAGNOSIS — R001 Bradycardia, unspecified: Secondary | ICD-10-CM | POA: Diagnosis not present

## 2019-05-23 DIAGNOSIS — R531 Weakness: Secondary | ICD-10-CM | POA: Diagnosis not present

## 2019-05-24 DIAGNOSIS — I509 Heart failure, unspecified: Secondary | ICD-10-CM | POA: Diagnosis not present

## 2019-05-24 DIAGNOSIS — F0280 Dementia in other diseases classified elsewhere without behavioral disturbance: Secondary | ICD-10-CM | POA: Diagnosis not present

## 2019-05-24 DIAGNOSIS — R001 Bradycardia, unspecified: Secondary | ICD-10-CM | POA: Diagnosis not present

## 2019-05-24 DIAGNOSIS — I110 Hypertensive heart disease with heart failure: Secondary | ICD-10-CM | POA: Diagnosis not present

## 2019-05-24 DIAGNOSIS — G20 Parkinson's disease: Secondary | ICD-10-CM | POA: Diagnosis not present

## 2019-05-24 DIAGNOSIS — R531 Weakness: Secondary | ICD-10-CM | POA: Diagnosis not present

## 2019-05-25 ENCOUNTER — Telehealth: Payer: Self-pay | Admitting: *Deleted

## 2019-05-25 NOTE — Telephone Encounter (Signed)
Returned call, reinforced recommendations, mainly to monitor for dehydration, consider electrolyte solution for oral supplementation, monitor for fever or blood.  Discussed would be ok to try imodium but would not use excessively.

## 2019-05-25 NOTE — Telephone Encounter (Signed)
[name] [name] calls to say that pt is taking smoothie type drinks as all his meals. He was constipated last week, gave him laxative, it cleared but in last 2 days he has started diarrhea, denies bad odor or watery, denies temp elevation. Was very restless last night. Only change started using applesauce as the thickener. Encouraged her to cut back on applesauce, try to buy a lactose free thickener. Ask her if it becomes worse or continues to call resident on call early Sunday to hopefully not experience dehydration. She is agreeable. Would you call her or advise.

## 2019-05-29 DIAGNOSIS — I509 Heart failure, unspecified: Secondary | ICD-10-CM | POA: Diagnosis not present

## 2019-05-29 DIAGNOSIS — F0280 Dementia in other diseases classified elsewhere without behavioral disturbance: Secondary | ICD-10-CM | POA: Diagnosis not present

## 2019-05-29 DIAGNOSIS — R001 Bradycardia, unspecified: Secondary | ICD-10-CM | POA: Diagnosis not present

## 2019-05-29 DIAGNOSIS — G20 Parkinson's disease: Secondary | ICD-10-CM | POA: Diagnosis not present

## 2019-05-29 DIAGNOSIS — R531 Weakness: Secondary | ICD-10-CM | POA: Diagnosis not present

## 2019-05-29 DIAGNOSIS — I110 Hypertensive heart disease with heart failure: Secondary | ICD-10-CM | POA: Diagnosis not present

## 2019-05-30 ENCOUNTER — Telehealth: Payer: Self-pay | Admitting: Pharmacist

## 2019-05-30 ENCOUNTER — Telehealth: Payer: Self-pay | Admitting: *Deleted

## 2019-05-30 DIAGNOSIS — G20 Parkinson's disease: Secondary | ICD-10-CM | POA: Diagnosis not present

## 2019-05-30 DIAGNOSIS — I110 Hypertensive heart disease with heart failure: Secondary | ICD-10-CM | POA: Diagnosis not present

## 2019-05-30 DIAGNOSIS — F0280 Dementia in other diseases classified elsewhere without behavioral disturbance: Secondary | ICD-10-CM | POA: Diagnosis not present

## 2019-05-30 DIAGNOSIS — R531 Weakness: Secondary | ICD-10-CM | POA: Diagnosis not present

## 2019-05-30 DIAGNOSIS — R001 Bradycardia, unspecified: Secondary | ICD-10-CM | POA: Diagnosis not present

## 2019-05-30 DIAGNOSIS — I509 Heart failure, unspecified: Secondary | ICD-10-CM | POA: Diagnosis not present

## 2019-05-30 NOTE — Telephone Encounter (Signed)
Received fax from [facility] with INR results from 02/05/2019 to 05/29/2019. Last INR 2.5 on 05/29/2019. Results placed in Dr. Gladstone Pih box. Hubbard Hartshorn, RN, BSN

## 2019-05-30 NOTE — Telephone Encounter (Signed)
Texted by the patient's daughter 1843h 29-May-2019 reporting PST FS POC INR 2.5 on 5mg  warfarin daily--except only 1/2 x 5mg  (2.5mg  dose) on Th/Sa each week. Advised to CONTINUE same regimen as goal INR 2.0 - 3.0. Repeat INR on 19-Jun-2019. No new medications, no missed doses. Adequate supply of warfarin on-hand.

## 2019-05-31 ENCOUNTER — Ambulatory Visit: Payer: Medicare HMO

## 2019-05-31 ENCOUNTER — Telehealth: Payer: Self-pay | Admitting: *Deleted

## 2019-05-31 NOTE — Telephone Encounter (Signed)
Call from pt's daughter - states pt continues to have diarrhea; also is not walking which usually indicates an UIT. states she tried Imodium which worked but diarrhea started back after he stopped taking it.she wonders if his diet of boost and vegetables may be causing it. She's unable to bring him in for an appt tomorrow and Monday d/t her teaching schedule. Wanting to know what to do? Thanks

## 2019-06-01 DIAGNOSIS — I509 Heart failure, unspecified: Secondary | ICD-10-CM | POA: Diagnosis not present

## 2019-06-01 DIAGNOSIS — R531 Weakness: Secondary | ICD-10-CM | POA: Diagnosis not present

## 2019-06-01 DIAGNOSIS — R001 Bradycardia, unspecified: Secondary | ICD-10-CM | POA: Diagnosis not present

## 2019-06-01 DIAGNOSIS — G20 Parkinson's disease: Secondary | ICD-10-CM | POA: Diagnosis not present

## 2019-06-01 DIAGNOSIS — I110 Hypertensive heart disease with heart failure: Secondary | ICD-10-CM | POA: Diagnosis not present

## 2019-06-01 DIAGNOSIS — F0280 Dementia in other diseases classified elsewhere without behavioral disturbance: Secondary | ICD-10-CM | POA: Diagnosis not present

## 2019-06-01 NOTE — Telephone Encounter (Signed)
Attempted call, no answer and VM mailbox was full

## 2019-06-01 NOTE — Telephone Encounter (Signed)
Called back spoke to andrea, as noted still having some diarrhea but overall improved.  She thinks he may have a UTI based on previous experience with this.  We also discussed this could be just progression of his Parkinson's.  She still has some cephalexin remaining from previous UTI she wanted to give him this.  I discussed that that would be reasonable she needs to monitor for worsening of diarrhea with any antibiotics.  And we discussed the effect this could have on his Coumadin which she is monitoring.  We discussed if he does not have improvement over the weekend that she will call back and make an appointment for Tuesday to be seen in our [facility] clinic

## 2019-06-04 DIAGNOSIS — F0280 Dementia in other diseases classified elsewhere without behavioral disturbance: Secondary | ICD-10-CM | POA: Diagnosis not present

## 2019-06-04 DIAGNOSIS — G20 Parkinson's disease: Secondary | ICD-10-CM | POA: Diagnosis not present

## 2019-06-04 DIAGNOSIS — I110 Hypertensive heart disease with heart failure: Secondary | ICD-10-CM | POA: Diagnosis not present

## 2019-06-04 DIAGNOSIS — R531 Weakness: Secondary | ICD-10-CM | POA: Diagnosis not present

## 2019-06-04 DIAGNOSIS — I509 Heart failure, unspecified: Secondary | ICD-10-CM | POA: Diagnosis not present

## 2019-06-04 DIAGNOSIS — R001 Bradycardia, unspecified: Secondary | ICD-10-CM | POA: Diagnosis not present

## 2019-06-05 DIAGNOSIS — I110 Hypertensive heart disease with heart failure: Secondary | ICD-10-CM | POA: Diagnosis not present

## 2019-06-05 DIAGNOSIS — F0280 Dementia in other diseases classified elsewhere without behavioral disturbance: Secondary | ICD-10-CM | POA: Diagnosis not present

## 2019-06-05 DIAGNOSIS — R531 Weakness: Secondary | ICD-10-CM | POA: Diagnosis not present

## 2019-06-05 DIAGNOSIS — R001 Bradycardia, unspecified: Secondary | ICD-10-CM | POA: Diagnosis not present

## 2019-06-05 DIAGNOSIS — G20 Parkinson's disease: Secondary | ICD-10-CM | POA: Diagnosis not present

## 2019-06-05 DIAGNOSIS — I509 Heart failure, unspecified: Secondary | ICD-10-CM | POA: Diagnosis not present

## 2019-06-07 ENCOUNTER — Other Ambulatory Visit: Payer: Self-pay

## 2019-06-07 ENCOUNTER — Ambulatory Visit (INDEPENDENT_AMBULATORY_CARE_PROVIDER_SITE_OTHER): Payer: Medicare Other | Admitting: *Deleted

## 2019-06-07 DIAGNOSIS — I509 Heart failure, unspecified: Secondary | ICD-10-CM | POA: Diagnosis not present

## 2019-06-07 DIAGNOSIS — R001 Bradycardia, unspecified: Secondary | ICD-10-CM | POA: Diagnosis not present

## 2019-06-07 DIAGNOSIS — R531 Weakness: Secondary | ICD-10-CM | POA: Diagnosis not present

## 2019-06-07 DIAGNOSIS — F0280 Dementia in other diseases classified elsewhere without behavioral disturbance: Secondary | ICD-10-CM | POA: Diagnosis not present

## 2019-06-07 DIAGNOSIS — Z23 Encounter for immunization: Secondary | ICD-10-CM

## 2019-06-07 DIAGNOSIS — I110 Hypertensive heart disease with heart failure: Secondary | ICD-10-CM | POA: Diagnosis not present

## 2019-06-07 DIAGNOSIS — G20 Parkinson's disease: Secondary | ICD-10-CM | POA: Diagnosis not present

## 2019-06-08 DIAGNOSIS — R001 Bradycardia, unspecified: Secondary | ICD-10-CM | POA: Diagnosis not present

## 2019-06-08 DIAGNOSIS — I110 Hypertensive heart disease with heart failure: Secondary | ICD-10-CM | POA: Diagnosis not present

## 2019-06-08 DIAGNOSIS — I509 Heart failure, unspecified: Secondary | ICD-10-CM | POA: Diagnosis not present

## 2019-06-08 DIAGNOSIS — R531 Weakness: Secondary | ICD-10-CM | POA: Diagnosis not present

## 2019-06-08 DIAGNOSIS — G20 Parkinson's disease: Secondary | ICD-10-CM | POA: Diagnosis not present

## 2019-06-08 DIAGNOSIS — F0280 Dementia in other diseases classified elsewhere without behavioral disturbance: Secondary | ICD-10-CM | POA: Diagnosis not present

## 2019-06-11 DIAGNOSIS — I509 Heart failure, unspecified: Secondary | ICD-10-CM | POA: Diagnosis not present

## 2019-06-11 DIAGNOSIS — R531 Weakness: Secondary | ICD-10-CM | POA: Diagnosis not present

## 2019-06-11 DIAGNOSIS — R001 Bradycardia, unspecified: Secondary | ICD-10-CM | POA: Diagnosis not present

## 2019-06-11 DIAGNOSIS — G20 Parkinson's disease: Secondary | ICD-10-CM | POA: Diagnosis not present

## 2019-06-11 DIAGNOSIS — I110 Hypertensive heart disease with heart failure: Secondary | ICD-10-CM | POA: Diagnosis not present

## 2019-06-11 DIAGNOSIS — F0280 Dementia in other diseases classified elsewhere without behavioral disturbance: Secondary | ICD-10-CM | POA: Diagnosis not present

## 2019-06-12 ENCOUNTER — Ambulatory Visit: Payer: Medicare HMO | Admitting: Adult Health

## 2019-06-12 DIAGNOSIS — F0280 Dementia in other diseases classified elsewhere without behavioral disturbance: Secondary | ICD-10-CM | POA: Diagnosis not present

## 2019-06-12 DIAGNOSIS — G20 Parkinson's disease: Secondary | ICD-10-CM | POA: Diagnosis not present

## 2019-06-12 DIAGNOSIS — I110 Hypertensive heart disease with heart failure: Secondary | ICD-10-CM | POA: Diagnosis not present

## 2019-06-12 DIAGNOSIS — R001 Bradycardia, unspecified: Secondary | ICD-10-CM | POA: Diagnosis not present

## 2019-06-12 DIAGNOSIS — I509 Heart failure, unspecified: Secondary | ICD-10-CM | POA: Diagnosis not present

## 2019-06-12 DIAGNOSIS — R531 Weakness: Secondary | ICD-10-CM | POA: Diagnosis not present

## 2019-06-13 DIAGNOSIS — G20 Parkinson's disease: Secondary | ICD-10-CM | POA: Diagnosis not present

## 2019-06-13 DIAGNOSIS — F0280 Dementia in other diseases classified elsewhere without behavioral disturbance: Secondary | ICD-10-CM | POA: Diagnosis not present

## 2019-06-13 DIAGNOSIS — I110 Hypertensive heart disease with heart failure: Secondary | ICD-10-CM | POA: Diagnosis not present

## 2019-06-13 DIAGNOSIS — R001 Bradycardia, unspecified: Secondary | ICD-10-CM | POA: Diagnosis not present

## 2019-06-13 DIAGNOSIS — I509 Heart failure, unspecified: Secondary | ICD-10-CM | POA: Diagnosis not present

## 2019-06-13 DIAGNOSIS — R531 Weakness: Secondary | ICD-10-CM | POA: Diagnosis not present

## 2019-06-14 DIAGNOSIS — R531 Weakness: Secondary | ICD-10-CM | POA: Diagnosis not present

## 2019-06-14 DIAGNOSIS — G20 Parkinson's disease: Secondary | ICD-10-CM | POA: Diagnosis not present

## 2019-06-14 DIAGNOSIS — F0280 Dementia in other diseases classified elsewhere without behavioral disturbance: Secondary | ICD-10-CM | POA: Diagnosis not present

## 2019-06-14 DIAGNOSIS — I110 Hypertensive heart disease with heart failure: Secondary | ICD-10-CM | POA: Diagnosis not present

## 2019-06-14 DIAGNOSIS — R001 Bradycardia, unspecified: Secondary | ICD-10-CM | POA: Diagnosis not present

## 2019-06-14 DIAGNOSIS — I509 Heart failure, unspecified: Secondary | ICD-10-CM | POA: Diagnosis not present

## 2019-06-15 DIAGNOSIS — G20 Parkinson's disease: Secondary | ICD-10-CM | POA: Diagnosis not present

## 2019-06-15 DIAGNOSIS — R531 Weakness: Secondary | ICD-10-CM | POA: Diagnosis not present

## 2019-06-15 DIAGNOSIS — I110 Hypertensive heart disease with heart failure: Secondary | ICD-10-CM | POA: Diagnosis not present

## 2019-06-15 DIAGNOSIS — F0280 Dementia in other diseases classified elsewhere without behavioral disturbance: Secondary | ICD-10-CM | POA: Diagnosis not present

## 2019-06-15 DIAGNOSIS — I509 Heart failure, unspecified: Secondary | ICD-10-CM | POA: Diagnosis not present

## 2019-06-15 DIAGNOSIS — R001 Bradycardia, unspecified: Secondary | ICD-10-CM | POA: Diagnosis not present

## 2019-06-18 DIAGNOSIS — R001 Bradycardia, unspecified: Secondary | ICD-10-CM | POA: Diagnosis not present

## 2019-06-18 DIAGNOSIS — F0280 Dementia in other diseases classified elsewhere without behavioral disturbance: Secondary | ICD-10-CM | POA: Diagnosis not present

## 2019-06-18 DIAGNOSIS — I110 Hypertensive heart disease with heart failure: Secondary | ICD-10-CM | POA: Diagnosis not present

## 2019-06-18 DIAGNOSIS — R531 Weakness: Secondary | ICD-10-CM | POA: Diagnosis not present

## 2019-06-18 DIAGNOSIS — G20 Parkinson's disease: Secondary | ICD-10-CM | POA: Diagnosis not present

## 2019-06-18 DIAGNOSIS — I509 Heart failure, unspecified: Secondary | ICD-10-CM | POA: Diagnosis not present

## 2019-06-19 DIAGNOSIS — I110 Hypertensive heart disease with heart failure: Secondary | ICD-10-CM | POA: Diagnosis not present

## 2019-06-19 DIAGNOSIS — R001 Bradycardia, unspecified: Secondary | ICD-10-CM | POA: Diagnosis not present

## 2019-06-19 DIAGNOSIS — R531 Weakness: Secondary | ICD-10-CM | POA: Diagnosis not present

## 2019-06-19 DIAGNOSIS — I2699 Other pulmonary embolism without acute cor pulmonale: Secondary | ICD-10-CM | POA: Diagnosis not present

## 2019-06-19 DIAGNOSIS — Z7901 Long term (current) use of anticoagulants: Secondary | ICD-10-CM | POA: Diagnosis not present

## 2019-06-19 DIAGNOSIS — F0280 Dementia in other diseases classified elsewhere without behavioral disturbance: Secondary | ICD-10-CM | POA: Diagnosis not present

## 2019-06-19 DIAGNOSIS — I509 Heart failure, unspecified: Secondary | ICD-10-CM | POA: Diagnosis not present

## 2019-06-19 DIAGNOSIS — G20 Parkinson's disease: Secondary | ICD-10-CM | POA: Diagnosis not present

## 2019-06-20 DIAGNOSIS — R001 Bradycardia, unspecified: Secondary | ICD-10-CM | POA: Diagnosis not present

## 2019-06-20 DIAGNOSIS — I509 Heart failure, unspecified: Secondary | ICD-10-CM | POA: Diagnosis not present

## 2019-06-20 DIAGNOSIS — I110 Hypertensive heart disease with heart failure: Secondary | ICD-10-CM | POA: Diagnosis not present

## 2019-06-20 DIAGNOSIS — R531 Weakness: Secondary | ICD-10-CM | POA: Diagnosis not present

## 2019-06-20 DIAGNOSIS — F0280 Dementia in other diseases classified elsewhere without behavioral disturbance: Secondary | ICD-10-CM | POA: Diagnosis not present

## 2019-06-20 DIAGNOSIS — G20 Parkinson's disease: Secondary | ICD-10-CM | POA: Diagnosis not present

## 2019-06-21 ENCOUNTER — Telehealth: Payer: Self-pay | Admitting: *Deleted

## 2019-06-21 DIAGNOSIS — F0280 Dementia in other diseases classified elsewhere without behavioral disturbance: Secondary | ICD-10-CM | POA: Diagnosis not present

## 2019-06-21 DIAGNOSIS — R32 Unspecified urinary incontinence: Secondary | ICD-10-CM | POA: Diagnosis not present

## 2019-06-21 DIAGNOSIS — R159 Full incontinence of feces: Secondary | ICD-10-CM | POA: Diagnosis not present

## 2019-06-21 DIAGNOSIS — R269 Unspecified abnormalities of gait and mobility: Secondary | ICD-10-CM | POA: Diagnosis not present

## 2019-06-21 DIAGNOSIS — I509 Heart failure, unspecified: Secondary | ICD-10-CM | POA: Diagnosis not present

## 2019-06-21 DIAGNOSIS — M1A00X0 Idiopathic chronic gout, unspecified site, without tophus (tophi): Secondary | ICD-10-CM | POA: Diagnosis not present

## 2019-06-21 DIAGNOSIS — Z515 Encounter for palliative care: Secondary | ICD-10-CM | POA: Diagnosis not present

## 2019-06-21 DIAGNOSIS — G20 Parkinson's disease: Secondary | ICD-10-CM | POA: Diagnosis not present

## 2019-06-21 DIAGNOSIS — R531 Weakness: Secondary | ICD-10-CM | POA: Diagnosis not present

## 2019-06-21 DIAGNOSIS — R001 Bradycardia, unspecified: Secondary | ICD-10-CM | POA: Diagnosis not present

## 2019-06-21 DIAGNOSIS — I110 Hypertensive heart disease with heart failure: Secondary | ICD-10-CM | POA: Diagnosis not present

## 2019-06-21 NOTE — Telephone Encounter (Signed)
Received pt's PT/INR self testing summary report from May 17 to Sept 30.   INR on 9/29 - 2.4

## 2019-06-22 NOTE — Telephone Encounter (Signed)
The patient's daughter had texted in real-time that day when test was performed. It was reacted to that day. Patient's daughter was instructed to give 1x5mg  warfarin on all days of week--EXCEPT on Thursdays and Saturdays--then, give only one-half (1/2) of his 5mg  strength warfarin tablet (dose = 2.5mg  on those days). Repeat weekly, and repeat INR as per agreement with the device provider.

## 2019-06-25 DIAGNOSIS — I509 Heart failure, unspecified: Secondary | ICD-10-CM | POA: Diagnosis not present

## 2019-06-25 DIAGNOSIS — I110 Hypertensive heart disease with heart failure: Secondary | ICD-10-CM | POA: Diagnosis not present

## 2019-06-25 DIAGNOSIS — R001 Bradycardia, unspecified: Secondary | ICD-10-CM | POA: Diagnosis not present

## 2019-06-25 DIAGNOSIS — R531 Weakness: Secondary | ICD-10-CM | POA: Diagnosis not present

## 2019-06-25 DIAGNOSIS — G20 Parkinson's disease: Secondary | ICD-10-CM | POA: Diagnosis not present

## 2019-06-25 DIAGNOSIS — F0280 Dementia in other diseases classified elsewhere without behavioral disturbance: Secondary | ICD-10-CM | POA: Diagnosis not present

## 2019-06-26 DIAGNOSIS — G20 Parkinson's disease: Secondary | ICD-10-CM | POA: Diagnosis not present

## 2019-06-26 DIAGNOSIS — F0280 Dementia in other diseases classified elsewhere without behavioral disturbance: Secondary | ICD-10-CM | POA: Diagnosis not present

## 2019-06-26 DIAGNOSIS — I110 Hypertensive heart disease with heart failure: Secondary | ICD-10-CM | POA: Diagnosis not present

## 2019-06-26 DIAGNOSIS — I509 Heart failure, unspecified: Secondary | ICD-10-CM | POA: Diagnosis not present

## 2019-06-26 DIAGNOSIS — R531 Weakness: Secondary | ICD-10-CM | POA: Diagnosis not present

## 2019-06-26 DIAGNOSIS — R001 Bradycardia, unspecified: Secondary | ICD-10-CM | POA: Diagnosis not present

## 2019-06-27 DIAGNOSIS — I509 Heart failure, unspecified: Secondary | ICD-10-CM | POA: Diagnosis not present

## 2019-06-27 DIAGNOSIS — G20 Parkinson's disease: Secondary | ICD-10-CM | POA: Diagnosis not present

## 2019-06-27 DIAGNOSIS — I110 Hypertensive heart disease with heart failure: Secondary | ICD-10-CM | POA: Diagnosis not present

## 2019-06-27 DIAGNOSIS — F0280 Dementia in other diseases classified elsewhere without behavioral disturbance: Secondary | ICD-10-CM | POA: Diagnosis not present

## 2019-06-27 DIAGNOSIS — R001 Bradycardia, unspecified: Secondary | ICD-10-CM | POA: Diagnosis not present

## 2019-06-27 DIAGNOSIS — R531 Weakness: Secondary | ICD-10-CM | POA: Diagnosis not present

## 2019-06-28 DIAGNOSIS — I509 Heart failure, unspecified: Secondary | ICD-10-CM | POA: Diagnosis not present

## 2019-06-28 DIAGNOSIS — F0280 Dementia in other diseases classified elsewhere without behavioral disturbance: Secondary | ICD-10-CM | POA: Diagnosis not present

## 2019-06-28 DIAGNOSIS — I110 Hypertensive heart disease with heart failure: Secondary | ICD-10-CM | POA: Diagnosis not present

## 2019-06-28 DIAGNOSIS — R001 Bradycardia, unspecified: Secondary | ICD-10-CM | POA: Diagnosis not present

## 2019-06-28 DIAGNOSIS — G20 Parkinson's disease: Secondary | ICD-10-CM | POA: Diagnosis not present

## 2019-06-28 DIAGNOSIS — R531 Weakness: Secondary | ICD-10-CM | POA: Diagnosis not present

## 2019-06-29 ENCOUNTER — Telehealth: Payer: Self-pay | Admitting: Internal Medicine

## 2019-06-29 NOTE — Telephone Encounter (Signed)
Pls call [name] back, she is wanting a prescription for a bed for pt [phone]

## 2019-07-02 NOTE — Telephone Encounter (Signed)
Pt's daughter requesting an orders for hospital bed, suction, and oxygen. Please call pt daughter, states she needs this ASAP.

## 2019-07-04 NOTE — Telephone Encounter (Signed)
Telehealth appt scheduled for tomorrow to discuss need for hospital bed and suctioning equipment. Once orders are in and note is completed will need to send community message to [name] [name] at [facility].

## 2019-07-04 NOTE — Telephone Encounter (Signed)
I spoke with patients daughter concerning needing a hospital bed,oxygen, and [place] who here father used [employer] said they were coming to pick up there equipment by Friday October 16.The daughter said they had extended her time on on last week.The number for the company is [phone].The clinic will contact the company.

## 2019-07-04 NOTE — Telephone Encounter (Signed)
[place], Germain Osgood, Orvis Brill, RN; [place], [place] Breed, [place]; [place], [place], [place]        Hi Karly Pitter   Yes we do indeed have suction equipment. He did have a bed from [facility] from 06-14-2017 until 09-11-2017. He would be eligible as long as he has not had one from another company in the last 5 years. We would need:   (1) Order for semi electric bed and suction machine and supplies  (2) We would also need current face to face notes stating the following: Due to (insert DX here) patient requires frequent changes in body position AND/OR has an  immediate need for a change in body position"  AND ONE (OR MORE) OF THE FOLLOWING:  . "The patient has a medical condition which requires positioning of the body in ways not feasible with an ordinary bed", or  . "The patient requires positioning of the body in ways not feasible with an ordinary bed in order to alleviate pain", or  . "The patient requires the head of the bed to be elevated more than 30 degrees most of the time due to congestive heart failure, chronic pulmonary disease, or problems with  aspiration"  Patient requires suction machine due to ___________.   Once all this is updated in the system please let me know and I will go in and pull it.   Thanks  Gardiner Rhyme

## 2019-07-04 NOTE — Telephone Encounter (Signed)
Spoke with Elmyra Ricks at [facility]. States patient was d/c on 07/01/2019 as he has been on their service X 2 years and he is not declining; has longer than 6 month prognosis. States patient was only receiving oxygen 1 L prn. Elmyra Ricks explained to patient's daughter that she only needed to call the DME company, [facility] [employer] at [phone] to learn monthly price to keep equipment as they are in contract with Medicare. Call placed to daughter. States [place] is out of network with [employer] and so would need to pay out of pocket. This was confirmed with Lattie Haw at [facility]. Daughter states patient previously had hospital bed with [facility]. Community message sent to Jolee Ewing at [facility] to see if they can supply suctioning equipment and if patient is eligible to receive semi-electric hospital bed. Hubbard Hartshorn, BSN, RN-BC

## 2019-07-05 ENCOUNTER — Other Ambulatory Visit: Payer: Self-pay

## 2019-07-05 ENCOUNTER — Ambulatory Visit (INDEPENDENT_AMBULATORY_CARE_PROVIDER_SITE_OTHER): Payer: Medicare Other | Admitting: Radiation Oncology

## 2019-07-05 ENCOUNTER — Ambulatory Visit: Payer: Medicare HMO

## 2019-07-05 VITALS — BP 150/107 | HR 67 | Temp 97.4°F | Ht 68.0 in

## 2019-07-05 DIAGNOSIS — G3183 Dementia with Lewy bodies: Secondary | ICD-10-CM

## 2019-07-05 DIAGNOSIS — F0280 Dementia in other diseases classified elsewhere without behavioral disturbance: Secondary | ICD-10-CM | POA: Diagnosis not present

## 2019-07-05 DIAGNOSIS — R1310 Dysphagia, unspecified: Secondary | ICD-10-CM

## 2019-07-05 DIAGNOSIS — G219 Secondary parkinsonism, unspecified: Secondary | ICD-10-CM

## 2019-07-05 NOTE — Assessment & Plan Note (Addendum)
-  pt with known dementia with parkinsonism who was previously on hospice until services were discontinued as patient was not declining; consequently, the patient will lose access to necessary hospice provided equipment soon including a hospital bed and a suction, both of which the patient requires as the patient has a history of dysphagia and the patient requires the head of the bed to be elevated more than 30 degrees most of the time due to problems with aspiration. -equipment ordered -palliative care consult placed    Due to dysphagia and dementia with parkinsonism patient requires frequent changes in body position AND/OR has an immediate need for a change in body position"   "The patient has a medical condition which requires positioning of the body in ways not feasible with an ordinary bed", or  "The patient requires positioning of the body in ways not feasible with an ordinary bed in order to alleviate pain", or  "The patient requires the head of the bed to be elevated more than 30 degrees most of the time due to congestive heart failure, chronic pulmonary disease, or problems with aspiration" Patient requires suction machine due to dysphagia and dementia with parkinsonism.

## 2019-07-05 NOTE — Progress Notes (Signed)
   CC: dysphagia  HPI:  [name] [name] is a 85 y.o. M here with complaints of trouble swallowing. Please see assessment and plan for full HPI.  Past Medical History:  Diagnosis Date  . Anxiety   . Coronary artery disease, non-occlusive    with history of MI; Cath 2008 w multivessel nonobstructive CAD  . Dementia ([place])   . Depression   . GERD (gastroesophageal reflux disease)   . Hyperlipidemia   . Hypertension   . Memory loss   . Parkinson disease ([place])   . PE (pulmonary embolism)    unprovoked PE completed 6 months of warfarin, warfarin d/ced 02/12/2010, repeat PE 07/10/2010 after a long car ride and now on lifelong coumadin.  . Pituitary microadenoma ([place])    incidental finding CT 12/2009  . Prostate cancer ([place])   . Scoliosis   . Sigmoid volvulus ([place]) 08/02/2016  . Stroke ([place]) 03/2018  . Volvulus of colon ([place]) 06/01/2017   Review of Systems:    Review of Systems  Constitutional:       Unable to ambulate  HENT:       Dysphagia  Respiratory: Positive for wheezing. Negative for shortness of breath.   Cardiovascular: Negative for chest pain.  Gastrointestinal: Negative for abdominal pain.  Neurological: Positive for tremors. Negative for headaches.  All other systems reviewed and are negative.  Physical Exam:  Vitals:   07/05/19 1510 07/05/19 1545  BP: (!) 193/130 (!) 150/107  Pulse: 69 67  Temp: (!) 97.4 F (36.3 C)   TempSrc: Oral   SpO2: 100%   Height: 5\' 8"  (1.727 m)    Physical Exam  Constitutional:  Elderly male sitting in wheelchair with his eyes closed  Cardiovascular: Normal rate, regular rhythm and intact distal pulses.  No murmur heard. Pulmonary/Chest: Effort normal. No respiratory distress. Wheezes: expiratory.  Abdominal: Soft. Bowel sounds are normal. He exhibits no distension. There is no abdominal tenderness.  Nursing note and vitals reviewed.  Assessment & Plan:   See Encounters Tab for problem based charting.  Patient seen with  Dr. Evette Doffing

## 2019-07-05 NOTE — Assessment & Plan Note (Addendum)
-  pt with known dysphagia had choking episode last night and daughter states she felt like she could hear the food caught in his throat -it resolved with suctioning and the patient didn't eat anymore that evening -today the daughter reports the patient was wheezing so she was concerned that it wasn't a normal choking episode for him -on exam the patient has an O2 sat of 100, is in no respiratory distress and his lungs are clear to auscultation bilaterally  -hospice recently discontinued service with them and they lost their associated equipment -patient requires suction machine due to longstanding GERD, dysphagia and dementia with parkinsonism; we reordered suction for him    Due to dysphagia and dementia with parkinsonism patient requires frequent changes in body position AND/OR has an immediate need for a change in body position"   "The patient has a medical condition which requires positioning of the body in ways not feasible with an ordinary bed", or  "The patient requires positioning of the body in ways not feasible with an ordinary bed in order to alleviate pain", or  "The patient requires the head of the bed to be elevated more than 30 degrees most of the time due to congestive heart failure, chronic pulmonary disease, or problems with aspiration" Patient requires suction machine due to dysphagia and dementia with parkinsonism.

## 2019-07-05 NOTE — Patient Instructions (Addendum)
Thank you for coming to your appointment. It was so nice to see you. 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  Sincerely,  Al Decant, MD

## 2019-07-06 ENCOUNTER — Telehealth: Payer: Self-pay | Admitting: Internal Medicine

## 2019-07-06 NOTE — Progress Notes (Signed)
Internal Medicine Clinic Attending  I saw and evaluated the patient.  I personally confirmed the key portions of the history and exam documented by Dr. Lanier and I reviewed pertinent patient test results.  The assessment, diagnosis, and plan were formulated together and I agree with the documentation in the resident's note.   

## 2019-07-06 NOTE — Telephone Encounter (Signed)
Pt needs a fully electric bed, pt daughter said they need another. Pls contact  [phone]

## 2019-07-06 NOTE — Telephone Encounter (Signed)
Community message sent to Jolee Ewing at [facility] for hospital bed and suctioning equipment. Hubbard Hartshorn, BSN, RN-BC

## 2019-07-06 NOTE — Telephone Encounter (Signed)
[place], Germain Osgood, Orvis Brill, RN; [place], Stanford Breed, [place]; [place], [place], [place]        Insurance will not cover a fully electric bed. The order needs to be for a semi electric bed. We need the notes updated to show he needs frequent repositioning.      Face to face notes need to state the following: Due to (insert DX here) patient requires frequent changes in body position AND/OR has an immediate need for a change in body position" AND ONE (OR MORE) OF THE FOLLOWING:  "The patient has a medical condition which requires positioning of the body in ways not feasible with an ordinary bed", or  "The patient requires positioning of the body in ways not feasible with an ordinary bed in order to alleviate pain", or  "The patient requires the head of the bed to be elevated more than 30 degrees most of the time due to congestive heart failure, chronic pulmonary disease, or problems with aspiration" Patient requires suction machine due to ___________.

## 2019-07-06 NOTE — Addendum Note (Signed)
Addended by: Adela Ports on: 07/06/2019 10:52 AM   Modules accepted: Orders

## 2019-07-06 NOTE — Telephone Encounter (Signed)
Daughter called in asking for fully electric bed for patient vs semi-electric bed. The semi-electric bed would still need to be cranked up and daughter is unable to do that 2/2 arthritis. States she spoke with [employer] and they told her to request a fully electric bed. Will send to provider who saw patient yesterday. Hubbard Hartshorn, BSN, RN-BC

## 2019-07-06 NOTE — Addendum Note (Signed)
Addended by: Adela Ports on: 07/06/2019 11:47 AM   Modules accepted: Orders

## 2019-07-06 NOTE — Telephone Encounter (Signed)
This is being addressed in separate phone encounter started 06/29/2019. Hubbard Hartshorn, BSN, RN-BC

## 2019-07-07 NOTE — Telephone Encounter (Signed)
Let me know if it's charted correctly!

## 2019-07-10 ENCOUNTER — Telehealth: Payer: Self-pay | Admitting: *Deleted

## 2019-07-10 DIAGNOSIS — G20 Parkinson's disease: Secondary | ICD-10-CM | POA: Diagnosis not present

## 2019-07-10 DIAGNOSIS — F0280 Dementia in other diseases classified elsewhere without behavioral disturbance: Secondary | ICD-10-CM | POA: Diagnosis not present

## 2019-07-10 DIAGNOSIS — G309 Alzheimer's disease, unspecified: Secondary | ICD-10-CM | POA: Diagnosis not present

## 2019-07-10 DIAGNOSIS — F0390 Unspecified dementia without behavioral disturbance: Secondary | ICD-10-CM | POA: Diagnosis not present

## 2019-07-10 NOTE — Telephone Encounter (Signed)
Denise from [facility] is able to accept this referral for palliative services. Hubbard Hartshorn, BSN, RN-BC

## 2019-07-16 ENCOUNTER — Other Ambulatory Visit: Payer: Self-pay | Admitting: *Deleted

## 2019-07-16 DIAGNOSIS — Z86711 Personal history of pulmonary embolism: Secondary | ICD-10-CM

## 2019-07-16 DIAGNOSIS — Z7902 Long term (current) use of antithrombotics/antiplatelets: Secondary | ICD-10-CM

## 2019-07-16 MED ORDER — WARFARIN SODIUM 5 MG PO TABS: ORAL_TABLET | ORAL | 1 refills | Status: DC

## 2019-07-19 ENCOUNTER — Telehealth: Payer: Self-pay | Admitting: Internal Medicine

## 2019-07-19 NOTE — Telephone Encounter (Signed)
Spoke with patient's daughter [name] regarding Palliative services and she was in agreement with this.  I have scheduled a [place] for 07/26/19 @ 3 PM

## 2019-07-25 ENCOUNTER — Telehealth: Payer: Self-pay | Admitting: Pharmacist

## 2019-07-25 NOTE — Telephone Encounter (Signed)
Patient self-testing, finger-stick point of care INR determination by daughter, [name] who provided me the results at 1819h on [id number]. She was advised to continue same warfarin regimen of 5mg  every day, except on Thursdays and Saturdays, 2.5mg . Advised to repeat PST FS POC INR determination as per agreement with the device provider. Denies any symptoms of bleeding, or embolic events. Adequate supply of warfarin on-hand.

## 2019-07-26 ENCOUNTER — Telehealth: Payer: Self-pay | Admitting: *Deleted

## 2019-07-26 ENCOUNTER — Other Ambulatory Visit: Payer: Self-pay

## 2019-07-26 ENCOUNTER — Other Ambulatory Visit: Payer: Medicare HMO | Admitting: Internal Medicine

## 2019-07-26 NOTE — Telephone Encounter (Signed)
Gave a call back, still having diarrhea>> ozzing of stool between BM.  She is keeping buttock clean without skin breakdown and he is able to stay hydrated.  I discussed this may be a side effect of food thickener/ the foods she has been using notably applesauce, she will attempt to make a change in his diet and monitor with diary if it improves.

## 2019-07-26 NOTE — Telephone Encounter (Signed)
Call from pt's daughter, [name] - stated pt is having on-going diarrhea;oozing thru out the day.Please advise. Thanks

## 2019-07-31 ENCOUNTER — Other Ambulatory Visit: Payer: Self-pay | Admitting: Neurology

## 2019-07-31 ENCOUNTER — Telehealth: Payer: Self-pay

## 2019-07-31 DIAGNOSIS — K529 Noninfective gastroenteritis and colitis, unspecified: Secondary | ICD-10-CM

## 2019-07-31 NOTE — Telephone Encounter (Signed)
[name] [name] calls and states that the palliative care NP suggested pt's stool be tested for either Cdiff or something else? To see if there is a reason the diarrhea is continuing, andrea states she will come by and pick up the kit. Please advise

## 2019-07-31 NOTE — Telephone Encounter (Signed)
Requesting to speak with a nurse about diarrhea and stool sample. Please call pt's daughter back.

## 2019-07-31 NOTE — Telephone Encounter (Signed)
Placed order for GI pathogen panel, this will test for C diff as well as most of the most common causes of infectious diarrhea.

## 2019-08-01 NOTE — Telephone Encounter (Signed)
Spoke with vichieb. Pt's daughter will be notified to pick up specific containers for this study. Called andrea and she will pick up tomorrow

## 2019-08-09 ENCOUNTER — Other Ambulatory Visit: Payer: Self-pay

## 2019-08-09 ENCOUNTER — Other Ambulatory Visit: Payer: Medicare HMO | Admitting: Internal Medicine

## 2019-08-09 ENCOUNTER — Other Ambulatory Visit: Payer: Medicare HMO

## 2019-08-09 DIAGNOSIS — K529 Noninfective gastroenteritis and colitis, unspecified: Secondary | ICD-10-CM

## 2019-08-09 DIAGNOSIS — R197 Diarrhea, unspecified: Secondary | ICD-10-CM | POA: Diagnosis not present

## 2019-08-13 ENCOUNTER — Telehealth: Payer: Self-pay | Admitting: Internal Medicine

## 2019-08-13 DIAGNOSIS — K529 Noninfective gastroenteritis and colitis, unspecified: Secondary | ICD-10-CM

## 2019-08-13 LAB — GI PROFILE, STOOL, PCR

## 2019-08-13 MED ORDER — CEPHALEXIN 500 MG PO CAPS: 500.0000 mg | ORAL_CAPSULE | Freq: Two times a day (BID) | ORAL | 0 refills | Status: AC

## 2019-08-13 NOTE — Telephone Encounter (Signed)
Gave [name] [name] a call with results of GI pathogen panel>> negative.  Reports still having liquid stool, occasional grimacing, would like an antibiotic as [name] [name] is sleeping more similar to previous UTI.  She also notes having to manually disimpact hard stool.  I discussed the possibility of encopresis (overflow diarrhea), we will obtain a xray for evaluate for fecal impaction.  Will provide short 3 day course of keflex for UTI.

## 2019-08-14 ENCOUNTER — Ambulatory Visit
Admission: RE | Admit: 2019-08-14 | Discharge: 2019-08-14 | Disposition: A | Discharge status: Home / Self Care | Payer: Medicare HMO | Source: Ambulatory Visit | Attending: Internal Medicine | Admitting: Internal Medicine

## 2019-08-14 ENCOUNTER — Other Ambulatory Visit: Payer: Self-pay

## 2019-08-14 DIAGNOSIS — K529 Noninfective gastroenteritis and colitis, unspecified: Secondary | ICD-10-CM

## 2019-08-14 DIAGNOSIS — R197 Diarrhea, unspecified: Secondary | ICD-10-CM | POA: Diagnosis not present

## 2019-08-15 ENCOUNTER — Telehealth: Payer: Self-pay | Admitting: Internal Medicine

## 2019-08-15 NOTE — Telephone Encounter (Signed)
Placed outgoing call to patient/ daughter [name] to discuss Xray results unfortuantely there was no answer x2 with VM also full,.    -As expected this does show stool impaction however fortunately it is at the end of his colon, recommend enemas to try to break up the stool however if that is unsuccessful he may need manual disimpaction.

## 2019-08-20 ENCOUNTER — Ambulatory Visit (INDEPENDENT_AMBULATORY_CARE_PROVIDER_SITE_OTHER): Payer: Medicare HMO | Admitting: Adult Health

## 2019-08-20 ENCOUNTER — Other Ambulatory Visit: Payer: Self-pay

## 2019-08-20 ENCOUNTER — Encounter: Payer: Self-pay | Admitting: Adult Health

## 2019-08-20 VITALS — BP 180/90 | HR 61 | Temp 97.8°F | Ht 68.0 in

## 2019-08-20 DIAGNOSIS — F0150 Vascular dementia without behavioral disturbance: Secondary | ICD-10-CM

## 2019-08-20 DIAGNOSIS — G214 Vascular parkinsonism: Secondary | ICD-10-CM

## 2019-08-20 DIAGNOSIS — I614 Nontraumatic intracerebral hemorrhage in cerebellum: Secondary | ICD-10-CM

## 2019-08-20 MED ORDER — CARBIDOPA-LEVODOPA 25-100 MG PO TABS: 1.0000 | ORAL_TABLET | Freq: Four times a day (QID) | ORAL | 3 refills | Status: DC

## 2019-08-20 NOTE — Patient Instructions (Signed)
Your Plan:  Continue Sinemet 1 tab every 3 hours - may consider changing to extended release if needed  Consider swallowing evaluation for potential need of artifical feeding in the future   Follow up in 6 months or call earlier if needed     Thank you for coming to see [name] at [facility]. I hope we have been able to provide you high quality care today.  You may receive a patient satisfaction survey over the next few weeks. We would appreciate your feedback and comments so that we may continue to improve ourselves and the health of our patients.

## 2019-08-20 NOTE — Progress Notes (Signed)
[facility] [address] [place]. [place] [address] [phone]       OFFICE FOLLOW UP NOTE  [name] [name] Date of Birth:  Jan 01, 1931 Medical Record Number:  [id number]   Reason for Referral: follow up  CHIEF COMPLAINT:  Chief Complaint  Patient presents with  . Follow-up    Treatment room, with daughter. Quivering is no as bad. Daughter states that he is drooling more.     HPI: Stroke admission 05/05/2018: [name] [name] is an 85 y.o. male with history of CAD, HTN, HLD, dementia, PE (on warfarin), advanced parkinson's disease, CHF with LVEF of 40 to 45% documented in 2016  who presented with bradycardia with acute onset of diaphoresis and decreased alertness.  Stroke work-up showed small right cerebellar ICH nontraumatic warfarin associated coagulopathy.  CT had reviewed and showed a small focus of hemorrhage in the right posterior fossa likely intraparenchymal within the cerebellum.  As patient does take warfarin at home, this was reversed in the ED. He did not receive IV t-PA due to [place].  MRI brain reviewed and showed subacute hematoma right cerebellum with methemoglobin formation and surrounding edema.  Felt as though ICH due to nontraumatic warfarin associated coagulopathy.  He was on warfarin and aspirin 81 mg daily admission and recommended continuing aspirin 81 mg at discharge and consider restarting warfarin approximately 2 weeks after repeat imaging with stopping of aspirin at that time.  HTN stable during admission recommended long-term BP goal normotensive range.  LDL 148 and patient was previously taking fish oil and recommended to start low-dose atorvastatin.  Patient discharged to [facility] for continued PT/OT/ST.  Hospital follow-up visit 07/13/2018: Patient is being seen today for hospital follow-up and is accompanied by daughter.  He has since returned home on 06/01/2018 with recommendations of home health PT/OT/ST.  Patient has been overall  doing well with continued swallowing concerns but no evidence of aspiration.  Daughter states especially in the morning hours, she will be feeding him and patient will hold food in his mouth.  He was seen in the ED for possible aspiration but no evidence of aspiration and no issue since this time.  Daughter is aware that they may need to pursue tube feeding in the future if he continues to have this issue.  He has completed therapies and is currently under hospice care which daughter was under the impression that he was palliative care.  She is unable to obtain therapies through hospice care but she does ensure he is getting therapy with assistance of CNA through hospice and herself.  He does have around-the-clock CNA care for continued assistance.  He has since also been restarted on warfarin and stopping of aspirin.  Recent INR 2.4 on 07/03/2018.  He has also continued on atorvastatin 10 mg daily without side effects of myalgias.  Blood pressure today satisfactory 114/77.  He continues to take Namenda for his memory and the Sinemet for his Parkinson's.  She no longer gives him Seroquel as she was advised to stop this during hospital admission but she does give him melatonin at night to assist with sleep.  She denies any worsening memory symptoms or Parkinson's symptoms since his stroke.  No further concerns at this time.  Denies new or worsening stroke/TIA symptoms.  Update 03/05/2019 Dr. Leonie Man : The patient is seen for follow-up today after his last visit in October 2019.  He is accompanied by his daughter.  She states that she is noticed worsening of  his tremulousness particularly when he is trying to change positions.  He has good days and bad days.  Patient has not been taking his Sinemet regularly as he is also had some swallowing difficulties.  The daughter cannot tell me if there is a correlation between taking adequate doses of Sinemet and tremors are not.  His dementia also appears to be worse.  He is  mostly noncommunicative but speaks only occasional words.  He requires total care and has 24-hour help.  He is able to get up with a 2 person assist and does walk a little bit.  He remains on Namenda XR 28 mg daily and Sinemet 25/100 mg 1 tablet 4 times daily.  He continues to be on warfarin and INR does fluctuate a little bit.  Is not had any bleeding or bruising or falls.  He has a CNA during the day and he lives with his daughter.  His blood pressure is slightly high at 142/85 today.  No other complaints. He is seems to be tolerating Sinemet when he takes it without significant peak dose dyskinesias, delusions, hallucinations, upset stomach, dizziness.  He does seem to sleep a lot during the day.  He is up most of the night.  His sleep-wake cycle seems to be reversed.  Update 08/20/2019: [name] [name] is being seen today for stroke follow-up and prior concerns of worsening tremors secondary to vascular parkinsonian symptoms.  Tremors have improved with use of Sinemet 1 tablet every 3 hours (approximately 4 times daily) typically at 10 AM, 1 PM, 4 PM and 7 PM.  Overall functional mobility and dementia stable.  Daughter does report increased drooling typically in the evening between 5 PM and 8 PM.  She does report difficulty at times with scheduled Sinemet due to patient sleeping or increased fatigue and fearful of providing medication due to possible aspiration.  He does remain on a pured diet and thickened liquid without difficulty.  He requires continue total care provided by daughter and CNA's.  Limited ambulation but is able to take 3-4 steps at a time with main transportation via wheelchair.  He continues on warfarin with stable INR levels.  Blood pressure today elevated due to tensing muscles and increase jerking/tremors during blood pressure reading.  Daughter reports similar situation at home but is able to obtain adequate reading while he is sleeping and typically 120-130.  He also remains on Namenda  XR 28 mg daily for underlying dementia.  Increase sleeping during the day and awake majority of the night which has been ongoing.  Recommended at prior visit to initiate melatonin 5 mg nightly but daughter felt increased fatigue the following day therefore this was discontinued.  No further concerns at this time.     ROS:   14 system review of systems performed and negative with exception of difficulty swallowing, weakness, tremors  PMH:  Past Medical History:  Diagnosis Date  . Anxiety   . Coronary artery disease, non-occlusive    with history of MI; Cath 2008 w multivessel nonobstructive CAD  . Dementia ([place])   . Depression   . GERD (gastroesophageal reflux disease)   . Hyperlipidemia   . Hypertension   . Memory loss   . Parkinson disease ([place])   . PE (pulmonary embolism)    unprovoked PE completed 6 months of warfarin, warfarin d/ced 02/12/2010, repeat PE 07/10/2010 after a long car ride and now on lifelong coumadin.  . Pituitary microadenoma ([place])    incidental finding CT 12/2009  .  Prostate cancer ([place])   . Scoliosis   . Sigmoid volvulus ([place]) 08/02/2016  . Stroke ([place]) 03/2018  . Volvulus of colon ([place]) 06/01/2017    PSH:  Past Surgical History:  Procedure Laterality Date  . CATARACT EXTRACTION    . COLONOSCOPY N/A 05/30/2017   Procedure: COLONOSCOPY;  Surgeon: Ronald Lobo, MD;  Location: [facility] ENDOSCOPY;  Service: Endoscopy;  Laterality: N/A;  . FLEXIBLE SIGMOIDOSCOPY N/A 08/02/2016   Procedure: FLEXIBLE SIGMOIDOSCOPY;  Surgeon: Ronald Lobo, MD;  Location: [facility] ENDOSCOPY;  Service: Endoscopy;  Laterality: N/A;  . FLEXIBLE SIGMOIDOSCOPY N/A 08/06/2016   Procedure: FLEXIBLE SIGMOIDOSCOPY;  Surgeon: Ronald Lobo, MD;  Location: [facility] ENDOSCOPY;  Service: Endoscopy;  Laterality: N/A;  . FLEXIBLE SIGMOIDOSCOPY Left 06/02/2017   Procedure: FLEXIBLE SIGMOIDOSCOPY;  Surgeon: Arta Silence, MD;  Location: [facility] ENDOSCOPY;  Service: Endoscopy;  Laterality: Left;  . FLEXIBLE  SIGMOIDOSCOPY N/A 06/07/2017   Procedure: FLEXIBLE SIGMOIDOSCOPY for possible decompression;  Surgeon: Otis Brace, MD;  Location: [place];  Service: Gastroenterology;  Laterality: N/A;  . LEFT HEART CATHETERIZATION WITH CORONARY ANGIOGRAM N/A 12/10/2014   Procedure: LEFT HEART CATHETERIZATION WITH CORONARY ANGIOGRAM;  Surgeon: [name], MD;  Location: [facility] CATH LAB;  Service: Cardiovascular;  Laterality: N/A;  . LOOP RECORDER IMPLANT N/A 12/16/2014   Procedure: LOOP RECORDER IMPLANT;  Surgeon: [name], MD;  Location: [facility] CATH LAB;  Service: Cardiovascular;  Laterality: N/A;  . PARTIAL COLECTOMY N/A 06/08/2017   Procedure: SIGMOID  COLECTOMY;  Surgeon: Georganna Skeans, MD;  Location: [place];  Service: General;  Laterality: N/A;  . PROSTATECTOMY      Social History:  Social History   Socioeconomic History  . Marital status: Married    Spouse name: Not on file  . Number of children: 4  . Years of education: master's  . Highest education level: Not on file  Occupational History  . Occupation: Retired    Fish farm manager: RETIRED  Social Needs  . Financial resource strain: Not on file  . Food insecurity    Worry: Not on file    Inability: Not on file  . Transportation needs    Medical: Not on file    Non-medical: Not on file  Tobacco Use  . Smoking status: Never Smoker  . Smokeless tobacco: Never Used  Substance and Sexual Activity  . Alcohol use: No    Alcohol/week: 0.0 standard drinks  . Drug use: No  . Sexual activity: Never  Lifestyle  . Physical activity    Days per week: Not on file    Minutes per session: Not on file  . Stress: Not on file  Relationships  . Social Herbalist on phone: Not on file    Gets together: Not on file    Attends religious service: Not on file    Active member of club or organization: Not on file    Attends meetings of clubs or organizations: Not on file    Relationship status: Not on file  . Intimate partner violence     Fear of current or ex partner: Not on file    Emotionally abused: Not on file    Physically abused: Not on file    Forced sexual activity: Not on file  Other Topics Concern  . Not on file  Social History Narrative   Lives with daughter. Wife also has severe dementia.    Drinks 1 cup of coffee a day     Family History:  Family History  Problem Relation Age of Onset  . Hypertension Father        Passed away from cerebral hemorrhage at age of 66.  [name] Dementia Mother   . Hypertension Child        4 adult children  . Cervical cancer Other   . Lung cancer Other   . Stroke Other   . Heart attack Neg Hx     Medications:   Current Outpatient Medications on File Prior to Visit  Medication Sig Dispense Refill  . acetaminophen (TYLENOL) 500 MG tablet Take 1,000 mg by mouth 2 (two) times daily.     [name] amLODipine (NORVASC) 2.5 MG tablet Take 0.5 tablets (1.25 mg total) by mouth daily. 45 tablet 1  . b complex vitamins tablet Take 1 tablet by mouth daily.    . Ca Phosphate-Cholecalciferol (CALCIUM/VITAMIN D3 GUMMIES) 250-350 MG-UNIT CHEW Chew 1 Dose by mouth 2 (two) times daily. 180 tablet 3  . carbidopa-levodopa (SINEMET IR) 25-100 MG tablet TAKE 1 TABLET BY MOUTH 5 (FIVE) TIMES DAILY. 360 tablet 1  . loratadine (CLARITIN) 10 MG tablet Take 10 mg by mouth daily.    . memantine (NAMENDA XR) 28 MG CP24 24 hr capsule TAKE 1 CAPSULE BY MOUTH EVERY DAY IN THE MORNING (Patient taking differently: Take 28 mg by mouth daily. ) 90 capsule 1  . Multiple Vitamins-Minerals (MULTIVITAMIN WITH MINERALS) tablet Take 1 tablet by mouth daily.     [name] omega-3 acid ethyl esters (LOVAZA) 1 g capsule Take 1 g by mouth daily.    [name] omeprazole (PRILOSEC) 10 MG capsule Take 10 mg by mouth daily as needed. OTC    . polyethylene glycol (MIRALAX / GLYCOLAX) packet Take 17 g by mouth daily as needed (constipation).    . potassium chloride SA (KLOR-CON M20) 20 MEQ tablet Take 2 tablets (40 mEq total) by mouth daily. 180  tablet 1  . senna (SENOKOT) 8.6 MG TABS tablet Take 1 tablet (8.6 mg total) by mouth daily as needed for mild constipation. 120 each 0  . STARCH-MALTO DEXTRIN ([employer] INSTANT FOOD THICKENER) POWD Take 1 Scoop by mouth daily as needed. (Patient taking differently: Take 1 Scoop by mouth daily as needed (food thickner). ) 1000 g 3  . warfarin (COUMADIN) 5 MG tablet TAKE 1/2 TABLET BY MOUTH EVERY THU, AND SAT AND 1 TABLET BY MOUTH ALL OTHER DAYS 90 tablet 1  . Zinc Oxide 10 % OINT Apply 1 application topically daily. On penis    . atorvastatin (LIPITOR) 10 MG tablet Take 1 tablet (10 mg total) by mouth daily at 6 PM. (Patient not taking: Reported on 08/20/2019) 90 tablet 1   No current facility-administered medications on file prior to visit.     Allergies:   Allergies  Allergen Reactions  . Aricept [Donepezil Hcl] Other (See Comments)    Symptomatic Bradycardia.  . Shellfish Allergy Other (See Comments)    Causes gout Causes a flare up with Gout     Physical Exam  Vitals:   08/20/19 1450  BP: (!) 180/90  Pulse: 61  Temp: 97.8 F (36.6 C)  Height: 5\' 8"  (1.727 m)   Body mass index is 21.29 kg/m. No exam data present  General: Frail elderly pleasant elderly African-American male, seated, in no evident distress Head: head normocephalic and atraumatic.   Neck: supple with no carotid or supraclavicular bruits Cardiovascular: regular rate and rhythm, no murmurs Musculoskeletal: no deformity Skin:  no rash/petichiae Vascular:  Normal pulses all extremities  Neurologic Exam Mental Status: Awake and fully alert.  Limited speech due to combination of dementia and Parkinson's but was able to say single words without difficulty with noticeable hypophonia.  Unable to assess further cognition. Diminished facial expression.  No drooling of saliva during visit.  Flat affect. Cranial Nerves:  Pupils equal, briskly reactive to light. Extraocular movements full without nystagmus. Visual fields  full to confrontation. Hearing intact. Facial sensation intact. Face, tongue, palate moves normally and symmetrically.  Motor: Normal bulk and tone.  Generalized weakness/uncooperation of all 4 extremities with right side slightly worse than left side.  Mild action tremors noted in upper extremities.  Increased tone in all 4 extremities with mild cogwheel rigidity noted. Sensory.: intact to touch , pinprick , position and vibratory sensation.  Coordination: Unable to assess rapid alternating movements or finger-to-nose or heel-to-shin due to uncooperation/cognition of patient Gait and Station: Gait assessment deferred as mainly nonambulatory Reflexes: 2+ brisk and symmetric. Toes downgoing.      Diagnostic Data (Labs, Imaging, Testing)  Ct Head Wo Contrast 05/05/2018 1. Small focus of hemorrhage in the right posterior fossa, likely intraparenchymal within the cerebellum.No associated mass effect.  2. Severe chronic ischemic microangiopathy.   MRI Brain Wo Contrast 05/06/2018 1. Subacute hematoma right cerebellum with methemoglobin formation and surrounding edema. 2. Advanced atrophy and advanced chronic microvascular ischemia 3. Enlarged pituitary 13 mm consistent with adenoma.  Ct Head Wo Contrast 05/06/2018 Stable appearance of focal hyperdensity in the right posterior fossa. This could represent a cerebellar hemorrhage or calcified meningioma. No progression.   Dg Chest 2 View 05/05/2018 Stable elevation of left hemidiaphragm. Mild bibasilar atelectasis. No edema or consolidation. Stable cardiac silhouette.    ASSESSMENT: [name] [name] is a 85 y.o. year old male here with right cerebellar ICH on 05/05/2018 secondary to warfarin associated coagulopathy. Vascular risk factors include CAD, HTN, HLD, dementia, PE on warfarin, advanced Parkinson's, and CHF.  Patient previously seen by Dr. Leonie Man due to worsening of tremors and dysphagia secondary to Parkinson's without proper use of  Sinemet.  Tremors have improved with regular use of Sinemet but daughter reports increased drooling recently.  At times will have difficulty with medication administration due to increased daytime fatigue/lethargy   PLAN: -Due to difficulty with every 3 hours Sinemet due to swallowing, recommend changing to disintegrating tablet.  Continue Sinemet 1 tab every 3 hours.  Unable to provide extended release tablet as pills have to be crushed -Discussion with daughter regarding increased drooling likely due to worsening dysphagia.  Discussion regarding potential use of artificial feeding in the future with PEG tube which may also help with medication administration.  She declines interest in MBS at this time but she would be willing to pursue PEG tube if indicated as she previously discussed this with her father who was in agreement.  She plans on continuing to follow with PCP for further discussion -Continue Namenda EX 28 mg daily for underlying dementia -Continue warfarin for history of pulmonary embolism with routine monitoring and INR level checks -Advised to try decreasing melatonin dosage to 2 mg nightly and to ensure medication routinely given at [facility] as patient sleep-wake cycle needs to be essentially reset.  Also discussed avoiding daytime sleeping and increased stimulation and daytime activity  Follow-up in 6 months or call earlier if needed   Greater than 50% of time during this 25 minute visit was spent on counseling, discussion regarding Parkinson's disease and potential symptoms with ongoing use of medication, explanation of diagnosis  of right cerebellar ICH, reviewing risk factor management of CAD, HTN, HLD, dementia, PE on warfarin, advanced Parkinson's and CHF, planning of further management, discussion with patient and family and coordination of care  Frann Rider, [facility]  [facility] [address] [place], [place] [address]  Phone [phone]  Fax [phone] Note: This document was prepared with digital dictation and possible smart phrase technology. Any transcriptional errors that result from this process are unintentional.

## 2019-08-22 NOTE — Progress Notes (Signed)
I agree with the above plan 

## 2019-09-02 ENCOUNTER — Emergency Department (HOSPITAL_COMMUNITY): Payer: Medicare HMO

## 2019-09-02 ENCOUNTER — Encounter (HOSPITAL_COMMUNITY): Payer: Self-pay | Admitting: Emergency Medicine

## 2019-09-02 ENCOUNTER — Emergency Department (HOSPITAL_COMMUNITY)
Admission: EM | Admit: 2019-09-02 | Discharge: 2019-09-02 | Disposition: A | Discharge status: Home / Self Care | Payer: Medicare HMO | Attending: Emergency Medicine | Admitting: Emergency Medicine

## 2019-09-02 ENCOUNTER — Other Ambulatory Visit: Payer: Self-pay

## 2019-09-02 DIAGNOSIS — G20 Parkinson's disease: Secondary | ICD-10-CM | POA: Diagnosis not present

## 2019-09-02 DIAGNOSIS — I5022 Chronic systolic (congestive) heart failure: Secondary | ICD-10-CM | POA: Diagnosis not present

## 2019-09-02 DIAGNOSIS — Z20828 Contact with and (suspected) exposure to other viral communicable diseases: Secondary | ICD-10-CM | POA: Insufficient documentation

## 2019-09-02 DIAGNOSIS — Z86711 Personal history of pulmonary embolism: Secondary | ICD-10-CM | POA: Diagnosis not present

## 2019-09-02 DIAGNOSIS — Z7901 Long term (current) use of anticoagulants: Secondary | ICD-10-CM | POA: Insufficient documentation

## 2019-09-02 DIAGNOSIS — R29810 Facial weakness: Secondary | ICD-10-CM | POA: Insufficient documentation

## 2019-09-02 DIAGNOSIS — I10 Essential (primary) hypertension: Secondary | ICD-10-CM | POA: Diagnosis not present

## 2019-09-02 DIAGNOSIS — I6602 Occlusion and stenosis of left middle cerebral artery: Secondary | ICD-10-CM | POA: Diagnosis not present

## 2019-09-02 DIAGNOSIS — Z8679 Personal history of other diseases of the circulatory system: Secondary | ICD-10-CM | POA: Diagnosis not present

## 2019-09-02 DIAGNOSIS — R4182 Altered mental status, unspecified: Secondary | ICD-10-CM | POA: Diagnosis not present

## 2019-09-02 DIAGNOSIS — R1312 Dysphagia, oropharyngeal phase: Secondary | ICD-10-CM | POA: Diagnosis not present

## 2019-09-02 DIAGNOSIS — Z79899 Other long term (current) drug therapy: Secondary | ICD-10-CM | POA: Insufficient documentation

## 2019-09-02 DIAGNOSIS — I639 Cerebral infarction, unspecified: Secondary | ICD-10-CM | POA: Diagnosis not present

## 2019-09-02 DIAGNOSIS — R29818 Other symptoms and signs involving the nervous system: Secondary | ICD-10-CM | POA: Diagnosis not present

## 2019-09-02 DIAGNOSIS — I110 Hypertensive heart disease with heart failure: Secondary | ICD-10-CM | POA: Insufficient documentation

## 2019-09-02 DIAGNOSIS — F0280 Dementia in other diseases classified elsewhere without behavioral disturbance: Secondary | ICD-10-CM | POA: Diagnosis not present

## 2019-09-02 LAB — CBC
HCT: 43.2 % (ref 39.0–52.0)
Hemoglobin: 13.7 g/dL (ref 13.0–17.0)
MCH: 31 pg (ref 26.0–34.0)
MCHC: 31.7 g/dL (ref 30.0–36.0)
MCV: 97.7 fL (ref 80.0–100.0)
Platelets: 201 10*3/uL (ref 150–400)
RBC: 4.42 MIL/uL (ref 4.22–5.81)
RDW: 15.4 % (ref 11.5–15.5)
WBC: 3.7 10*3/uL — ABNORMAL LOW (ref 4.0–10.5)
nRBC: 0 % (ref 0.0–0.2)

## 2019-09-02 LAB — COMPREHENSIVE METABOLIC PANEL
ALT: 22 U/L (ref 0–44)
AST: 82 U/L — ABNORMAL HIGH (ref 15–41)
Albumin: 3.3 g/dL — ABNORMAL LOW (ref 3.5–5.0)
Alkaline Phosphatase: 55 U/L (ref 38–126)
Anion gap: 9 (ref 5–15)
BUN: 10 mg/dL (ref 8–23)
CO2: 25 mmol/L (ref 22–32)
Calcium: 9.3 mg/dL (ref 8.9–10.3)
Chloride: 108 mmol/L (ref 98–111)
Creatinine, Ser: 0.56 mg/dL — ABNORMAL LOW (ref 0.61–1.24)
GFR calc Af Amer: >60 mL/min (ref >60)
GFR calc non Af Amer: >60 mL/min (ref >60)
Glucose, Bld: 90 mg/dL (ref 70–99)
Potassium: 4.4 mmol/L (ref 3.5–5.1)
Sodium: 142 mmol/L (ref 135–145)
Total Bilirubin: 1.1 mg/dL (ref 0.3–1.2)
Total Protein: 7 g/dL (ref 6.5–8.1)

## 2019-09-02 LAB — I-STAT CHEM 8, ED
BUN: 13 mg/dL (ref 8–23)
Calcium, Ion: 1.2 mmol/L (ref 1.15–1.40)
Chloride: 106 mmol/L (ref 98–111)
Creatinine, Ser: 0.7 mg/dL (ref 0.61–1.24)
Glucose, Bld: 85 mg/dL (ref 70–99)
HCT: 44 % (ref 39.0–52.0)
Hemoglobin: 15 g/dL (ref 13.0–17.0)
Potassium: 4.3 mmol/L (ref 3.5–5.1)
Sodium: 142 mmol/L (ref 135–145)
TCO2: 27 mmol/L (ref 22–32)

## 2019-09-02 LAB — DIFFERENTIAL
Abs Immature Granulocytes: 0.01 10*3/uL (ref 0.00–0.07)
Basophils Absolute: 0 10*3/uL (ref 0.0–0.1)
Basophils Relative: 1 %
Eosinophils Absolute: 0.1 10*3/uL (ref 0.0–0.5)
Eosinophils Relative: 2 %
Immature Granulocytes: 0 %
Lymphocytes Relative: 34 %
Lymphs Abs: 1.3 10*3/uL (ref 0.7–4.0)
Monocytes Absolute: 0.3 10*3/uL (ref 0.1–1.0)
Monocytes Relative: 7 %
Neutro Abs: 2.1 10*3/uL (ref 1.7–7.7)
Neutrophils Relative %: 56 %

## 2019-09-02 LAB — URINALYSIS, ROUTINE W REFLEX MICROSCOPIC
Bacteria, UA: NONE SEEN
Bilirubin Urine: NEGATIVE
Glucose, UA: NEGATIVE mg/dL
Ketones, ur: NEGATIVE mg/dL
Nitrite: NEGATIVE
Protein, ur: NEGATIVE mg/dL
RBC / HPF: >50 RBC/hpf — ABNORMAL HIGH (ref 0–5)
Specific Gravity, Urine: 1.041 — ABNORMAL HIGH (ref 1.005–1.030)
pH: 7 (ref 5.0–8.0)

## 2019-09-02 LAB — SARS CORONAVIRUS 2 (TAT 6-24 HRS): SARS Coronavirus 2: NEGATIVE

## 2019-09-02 LAB — PROTIME-INR
INR: 2.6 — ABNORMAL HIGH (ref 0.8–1.2)
Prothrombin Time: 27.7 seconds — ABNORMAL HIGH (ref 11.4–15.2)

## 2019-09-02 LAB — APTT: aPTT: 40 seconds — ABNORMAL HIGH (ref 24–36)

## 2019-09-02 LAB — CBG MONITORING, ED: Glucose-Capillary: 75 mg/dL (ref 70–99)

## 2019-09-02 MED ORDER — SODIUM CHLORIDE 0.9% FLUSH
3.0000 mL | Freq: Once | INTRAVENOUS | Status: AC
  Administered 2019-09-02: 14:00:00 3 mL via INTRAVENOUS

## 2019-09-02 MED ORDER — IOHEXOL 350 MG/ML SOLN
100.0000 mL | Freq: Once | INTRAVENOUS | Status: AC | PRN
  Administered 2019-09-02: 100 mL via INTRAVENOUS

## 2019-09-02 NOTE — ED Triage Notes (Signed)
Daughter reports pt had sudden onset of drooling, L sided facial droop, and aphasia at 12:30pm.  States he has Parkinson's and speech is normally limited but that he does normally talk.  States he also is normally not able to raise his L arm.  Reports previous hemorrhagic stroke.  Pt straight to bridge for Dr. Gilford Raid to assess and Code Stroke activated.  Pt to CT with this RN and report handed off to [place] in [place].  Daughter in consultation room.  Dr. Erlinda Hong aware.

## 2019-09-02 NOTE — Consult Note (Signed)
Stroke Neurology Consultation Note  Consult Requested by: Dr. Lita Mains  Reason for Consult: code stroke  Consult Date: 09/02/19  The history was obtained from the daughter.  During history and examination, all items were able to obtain unless otherwise noted.  History of Present Illness:  [name] [name] is a 85 y.o. African American male with PMH of CAD, HTN, HLD, advance PD and PD dementia, PE on warfarin, CHF, right cerebellum ICH in the setting with coumadin presented as code stroke.  As per daughter, pt at baseline with limited speaking and activity had acute onset left side drooling and not talking or swallowing at 12:30pm.  She did not notice any weakness out of his baseline, no shaking jerking, no LOC. As per patient chart, patient following at [facility] for PD, PD dementia, stroke.  Last seen at [facility] on 08/20/2019.  It was mentioned in the chart that patient had increasing drooling due to worsening dysphagia and the potential consideration of PEG tube in the future.  However, today's event seems to be worsening from baseline drooling and dysphagia.  As per daughter, patient had tachycardia last night all over the place, with high blood pressure 177/137 last night also.  However, no significant neuro changes at that time.  At baseline, patient able to have limited words, slow responding, but able to follow commands.  Need 2 people assistance for walk.  He is on Namenda for dementia treatment.    Patient had right cerebellum small ICH in the setting of Coumadin in 04/2018, but INR at time only 2.2.  His Coumadin was later on restarted.  Daughter said last INR was 2.5 about 2 weeks ago.  Today INR 2.6.  LSN: 12:30pm tPA Given: No: INR 2.6, hx of ICH   Past Medical History:  Diagnosis Date  . Anxiety   . Coronary artery disease, non-occlusive    with history of MI; Cath 2008 w multivessel nonobstructive CAD  . Dementia ([place])   . Depression   . GERD (gastroesophageal reflux disease)   .  Hyperlipidemia   . Hypertension   . Memory loss   . Parkinson disease ([place])   . PE (pulmonary embolism)    unprovoked PE completed 6 months of warfarin, warfarin d/ced 02/12/2010, repeat PE 07/10/2010 after a long car ride and now on lifelong coumadin.  . Pituitary microadenoma ([place])    incidental finding CT 12/2009  . Prostate cancer ([place])   . Scoliosis   . Sigmoid volvulus ([place]) 08/02/2016  . Stroke ([place]) 03/2018  . Volvulus of colon ([place]) 06/01/2017    Past Surgical History:  Procedure Laterality Date  . CATARACT EXTRACTION    . COLONOSCOPY N/A 05/30/2017   Procedure: COLONOSCOPY;  Surgeon: Ronald Lobo, MD;  Location: [facility] ENDOSCOPY;  Service: Endoscopy;  Laterality: N/A;  . FLEXIBLE SIGMOIDOSCOPY N/A 08/02/2016   Procedure: FLEXIBLE SIGMOIDOSCOPY;  Surgeon: Ronald Lobo, MD;  Location: [facility] ENDOSCOPY;  Service: Endoscopy;  Laterality: N/A;  . FLEXIBLE SIGMOIDOSCOPY N/A 08/06/2016   Procedure: FLEXIBLE SIGMOIDOSCOPY;  Surgeon: Ronald Lobo, MD;  Location: [facility] ENDOSCOPY;  Service: Endoscopy;  Laterality: N/A;  . FLEXIBLE SIGMOIDOSCOPY Left 06/02/2017   Procedure: FLEXIBLE SIGMOIDOSCOPY;  Surgeon: Arta Silence, MD;  Location: [facility] ENDOSCOPY;  Service: Endoscopy;  Laterality: Left;  . FLEXIBLE SIGMOIDOSCOPY N/A 06/07/2017   Procedure: FLEXIBLE SIGMOIDOSCOPY for possible decompression;  Surgeon: Otis Brace, MD;  Location: [place];  Service: Gastroenterology;  Laterality: N/A;  . LEFT HEART CATHETERIZATION WITH CORONARY ANGIOGRAM N/A 12/10/2014   Procedure: LEFT HEART  CATHETERIZATION WITH CORONARY ANGIOGRAM;  Surgeon: [name], MD;  Location: [facility] CATH LAB;  Service: Cardiovascular;  Laterality: N/A;  . LOOP RECORDER IMPLANT N/A 12/16/2014   Procedure: LOOP RECORDER IMPLANT;  Surgeon: [name], MD;  Location: [facility] CATH LAB;  Service: Cardiovascular;  Laterality: N/A;  . PARTIAL COLECTOMY N/A 06/08/2017   Procedure: SIGMOID  COLECTOMY;  Surgeon: Georganna Skeans, MD;   Location: [place];  Service: General;  Laterality: N/A;  . PROSTATECTOMY      Family History  Problem Relation Age of Onset  . Hypertension Father        Passed away from cerebral hemorrhage at age of 32.  [name] Dementia Mother   . Hypertension Child        4 adult children  . Cervical cancer Other   . Lung cancer Other   . Stroke Other   . Heart attack Neg Hx     Social History:  reports that he has never smoked. He has never used smokeless tobacco. He reports that he does not drink alcohol or use drugs.  Allergies:  Allergies  Allergen Reactions  . Aricept [Donepezil Hcl] Other (See Comments)    Symptomatic Bradycardia.  . Shellfish Allergy Other (See Comments)    Causes gout Causes a flare up with Gout    No current facility-administered medications on file prior to encounter.   Current Outpatient Medications on File Prior to Encounter  Medication Sig Dispense Refill  . acetaminophen (TYLENOL) 500 MG tablet Take 1,000 mg by mouth 2 (two) times daily.     [name] amLODipine (NORVASC) 2.5 MG tablet Take 0.5 tablets (1.25 mg total) by mouth daily. 45 tablet 1  . atorvastatin (LIPITOR) 10 MG tablet Take 1 tablet (10 mg total) by mouth daily at 6 PM. (Patient not taking: Reported on 08/20/2019) 90 tablet 1  . b complex vitamins tablet Take 1 tablet by mouth daily.    . Ca Phosphate-Cholecalciferol (CALCIUM/VITAMIN D3 GUMMIES) 250-350 MG-UNIT CHEW Chew 1 Dose by mouth 2 (two) times daily. 180 tablet 3  . carbidopa-levodopa (SINEMET IR) 25-100 MG tablet Take 1 tablet by mouth 4 (four) times daily. 120 tablet 3  . loratadine (CLARITIN) 10 MG tablet Take 10 mg by mouth daily.    . memantine (NAMENDA XR) 28 MG CP24 24 hr capsule TAKE 1 CAPSULE BY MOUTH EVERY DAY IN THE MORNING (Patient taking differently: Take 28 mg by mouth daily. ) 90 capsule 1  . Multiple Vitamins-Minerals (MULTIVITAMIN WITH MINERALS) tablet Take 1 tablet by mouth daily.     [name] omega-3 acid ethyl esters (LOVAZA) 1 g capsule  Take 1 g by mouth daily.    [name] omeprazole (PRILOSEC) 10 MG capsule Take 10 mg by mouth daily as needed. OTC    . polyethylene glycol (MIRALAX / GLYCOLAX) packet Take 17 g by mouth daily as needed (constipation).    . potassium chloride SA (KLOR-CON M20) 20 MEQ tablet Take 2 tablets (40 mEq total) by mouth daily. 180 tablet 1  . senna (SENOKOT) 8.6 MG TABS tablet Take 1 tablet (8.6 mg total) by mouth daily as needed for mild constipation. 120 each 0  . STARCH-MALTO DEXTRIN ([employer] INSTANT FOOD THICKENER) POWD Take 1 Scoop by mouth daily as needed. (Patient taking differently: Take 1 Scoop by mouth daily as needed (food thickner). ) 1000 g 3  . warfarin (COUMADIN) 5 MG tablet TAKE 1/2 TABLET BY MOUTH EVERY THU, AND SAT AND 1 TABLET BY MOUTH ALL OTHER  DAYS 90 tablet 1  . Zinc Oxide 10 % OINT Apply 1 application topically daily. On penis      Review of Systems: A full ROS was attempted today and was able to be performed by asking daughter.  Systems assessed include - Constitutional, Eyes, HENT, Respiratory, Cardiovascular, Gastrointestinal, Genitourinary, Integument/breast, Hematologic/lymphatic, Musculoskeletal, Neurological, Behavioral/Psych, Endocrine, Allergic/Immunologic - with pertinent responses as per HPI.  Physical Examination: Temp:  [97.6 F (36.4 C)] 97.6 F (36.4 C) (12/13 1400) Pulse Rate:  [52-111] 111 (12/13 1500) Resp:  [9-16] 10 (12/13 1500) BP: (130-163)/(107-112) 146/107 (12/13 1500) SpO2:  [94 %-100 %] 95 % (12/13 1500)  General - well nourished, well developed, in no apparent distress.    Ophthalmologic - fundi not visualized due to noncooperation.    Cardiovascular - regular rhythm and rate  Neuro - awake, eyes closed initially, however able to open with request, however, severe psychomotor slowing.  PERRL, eyes mid position, able to have bilateral gaze intermittently with slow responding.  Blinking to visual threat bilaterally.  Seems to have mild left facial droop versus  positioning related.  Not corporative on tongue protrusion.  Mask-like face, lack of the facial expression.  Able to say his name, but no other speech output.  Able to follow limited commands but very slow responding.  Bilateral arm and leg symmetric motor strength, severe bradykinesia.  Bilateral increased muscle tone, rigidity with cogwheeling.  DTR 1+, no Babinski. Sensation, coordination and gait not tested.  NIH Stroke Scale  Level Of Consciousness 0=Alert; keenly responsive 1=Arouse to minor stimulation 2=Requires repeated stimulation to arouse or movements to pain 3=postures or unresponsive 0  LOC Questions to Month and Age 19=Answers both questions correctly 1=Answers one question correctly or dysarthria/intubated/trauma/language barrier 2=Answers neither question correctly or aphasia 2  LOC Commands      -Open/Close eyes     -Open/close grip     -Pantomime commands if communication barrier 0=Performs both tasks correctly 1=Performs one task correctly 2=Performs neighter task correctly 1  Best Gaze     -Only assess horizontal gaze 0=Normal 1=Partial gaze palsy 2=Forced deviation, or total gaze paresis 0  Visual 0=No visual loss 1=Partial hemianopia 2=Complete hemianopia 3=Bilateral hemianopia (blind including cortical blindness) 0  Facial Palsy     -Use grimace if obtunded 0=Normal symmetrical movement 1=Minor paralysis (asymmetry) 2=Partial paralysis (lower face) 3=Complete paralysis (upper and lower face) 1  Motor  0=No drift for 10/5 seconds 1=Drift, but does not hit bed 2=Some antigravity effort, hits  bed 3=No effort against gravity, limb falls 4=No movement 0=Amputation/joint fusion Right Arm 0     Leg 2    Left Arm 0     Leg 2  Limb Ataxia     - FNT/HTS 0=Absent or does not understand or paralyzed or amputation/joint fusion 1=Present in one limb 2=Present in two limbs 0  Sensory 0=Normal 1=Mild to moderate sensory loss 2=Severe to total sensory loss or  coma/unresponsive 1  Best Language 0=No aphasia, normal 1=Mild to moderate aphasia 2=Severe aphasia 3=Mute, global aphasia, or coma/unresponsive 2  Dysarthria 0=Normal 1=Mild to moderate 2=Severe, unintelligible or mute/anarthric 0=intubated/unable to test 2  Extinction/Neglect 0=No abnormality 1=visual/tactile/auditory/spatia/personal inattention/Extinction to bilateral simultaneous stimulation 2=Profound neglect/extinction more than 1 modality  0  Total   13     Data Reviewed: CT ANGIO HEAD W OR WO CONTRAST  Result Date: 09/02/2019 CLINICAL DATA:  Stroke.  Last seen normal 1200 hours. EXAM: CT ANGIOGRAPHY HEAD AND NECK CT PERFUSION BRAIN TECHNIQUE: Multidetector CT  imaging of the head and neck was performed using the standard protocol during bolus administration of intravenous contrast. Multiplanar CT image reconstructions and MIPs were obtained to evaluate the vascular anatomy. Carotid stenosis measurements (when applicable) are obtained utilizing NASCET criteria, using the distal internal carotid diameter as the denominator. Multiphase CT imaging of the brain was performed following IV bolus contrast injection. Subsequent parametric perfusion maps were calculated using RAPID software. CONTRAST:  19mL OMNIPAQUE IOHEXOL 350 MG/ML SOLN COMPARISON:  CT head 09/02/2019 FINDINGS: CTA NECK FINDINGS Aortic arch: Standard branching. Imaged portion shows no evidence of aneurysm or dissection. No significant stenosis of the major arch vessel origins. Right carotid system: Right carotid widely patent without significant stenosis. Minimal atherosclerotic disease at the carotid bifurcation Left carotid system: Left carotid widely patent. Negative for atherosclerotic disease or stenosis. Vertebral arteries: Left vertebral dominant. Left vertebral artery patent to the basilar without stenosis. Small right vertebral artery which appears to be highly stenotic or possibly occluded at the skull base. Skeleton:  Cervical spondylosis.  No acute skeletal abnormality. Other neck: Negative for mass or adenopathy in the neck. Upper chest: Lung apices clear bilaterally. Review of the MIP images confirms the above findings CTA HEAD FINDINGS Anterior circulation: Atherosclerotic calcification throughout the cavernous carotid bilaterally. Moderate stenosis bilaterally. Anterior and middle cerebral arteries patent without occlusion. Atherosclerotic diffuse narrowing and M1 and M2 segments bilaterally. Severe stenosis right M2 branch supplying the right parietal lobe. Posterior circulation: Left vertebral artery supplies the basilar with mild stenosis at the skull base. Small right vertebral artery probably occluded distally although could have a tiny branch supplying the basilar. Basilar patent with mild atherosclerotic stenosis. Superior cerebellar arteries are diseased but without significant stenosis. Venous sinuses: Lack of significant venous contrast due to arterial phase scanning Anatomic variants: Enlarged sella with pituitary lesion measuring 13 mm. Review of the MIP images confirms the above findings CT Brain Perfusion Findings: ASPECTS: 10 CBF (<30%) Volume: 29mL Perfusion (Tmax>6.0s) volume: 157mL Mismatch Volume: 189mL Infarction Location:Multiple foci of delayed perfusion. The largest areas in the right parietal lobe. Other areas include the right frontal lobe, and cerebellum bilaterally. IMPRESSION: 1. CT perfusion negative for core infarct. 104 mL of delayed perfusion. Multiple areas of delayed perfusion or seen most prominently in the right parietal lobe. Other areas could be ischemic or artifactual. 2. No significant carotid stenosis in the neck. 3. Left vertebral artery dominant. Severe stenosis or occlusion distal right vertebral artery. 4. Diffuse intracranial atherosclerotic disease without large vessel occlusion. Severe stenosis of the right M2 segment supplying the right parietal lobe. 5. These results were  called by telephone at the time of interpretation on 09/02/2019 at 2:35 pm to provider [facility] , who verbally acknowledged these results. Electronically Signed   By: Franchot Gallo M.D.   On: 09/02/2019 14:35   CT ANGIO NECK W OR WO CONTRAST  Result Date: 09/02/2019 CLINICAL DATA:  Stroke.  Last seen normal 1200 hours. EXAM: CT ANGIOGRAPHY HEAD AND NECK CT PERFUSION BRAIN TECHNIQUE: Multidetector CT imaging of the head and neck was performed using the standard protocol during bolus administration of intravenous contrast. Multiplanar CT image reconstructions and MIPs were obtained to evaluate the vascular anatomy. Carotid stenosis measurements (when applicable) are obtained utilizing NASCET criteria, using the distal internal carotid diameter as the denominator. Multiphase CT imaging of the brain was performed following IV bolus contrast injection. Subsequent parametric perfusion maps were calculated using RAPID software. CONTRAST:  156mL OMNIPAQUE IOHEXOL 350 MG/ML SOLN COMPARISON:  CT head 09/02/2019 FINDINGS: CTA NECK FINDINGS Aortic arch: Standard branching. Imaged portion shows no evidence of aneurysm or dissection. No significant stenosis of the major arch vessel origins. Right carotid system: Right carotid widely patent without significant stenosis. Minimal atherosclerotic disease at the carotid bifurcation Left carotid system: Left carotid widely patent. Negative for atherosclerotic disease or stenosis. Vertebral arteries: Left vertebral dominant. Left vertebral artery patent to the basilar without stenosis. Small right vertebral artery which appears to be highly stenotic or possibly occluded at the skull base. Skeleton: Cervical spondylosis.  No acute skeletal abnormality. Other neck: Negative for mass or adenopathy in the neck. Upper chest: Lung apices clear bilaterally. Review of the MIP images confirms the above findings CTA HEAD FINDINGS Anterior circulation: Atherosclerotic calcification  throughout the cavernous carotid bilaterally. Moderate stenosis bilaterally. Anterior and middle cerebral arteries patent without occlusion. Atherosclerotic diffuse narrowing and M1 and M2 segments bilaterally. Severe stenosis right M2 branch supplying the right parietal lobe. Posterior circulation: Left vertebral artery supplies the basilar with mild stenosis at the skull base. Small right vertebral artery probably occluded distally although could have a tiny branch supplying the basilar. Basilar patent with mild atherosclerotic stenosis. Superior cerebellar arteries are diseased but without significant stenosis. Venous sinuses: Lack of significant venous contrast due to arterial phase scanning Anatomic variants: Enlarged sella with pituitary lesion measuring 13 mm. Review of the MIP images confirms the above findings CT Brain Perfusion Findings: ASPECTS: 10 CBF (<30%) Volume: 6mL Perfusion (Tmax>6.0s) volume: 161mL Mismatch Volume: 139mL Infarction Location:Multiple foci of delayed perfusion. The largest areas in the right parietal lobe. Other areas include the right frontal lobe, and cerebellum bilaterally. IMPRESSION: 1. CT perfusion negative for core infarct. 104 mL of delayed perfusion. Multiple areas of delayed perfusion or seen most prominently in the right parietal lobe. Other areas could be ischemic or artifactual. 2. No significant carotid stenosis in the neck. 3. Left vertebral artery dominant. Severe stenosis or occlusion distal right vertebral artery. 4. Diffuse intracranial atherosclerotic disease without large vessel occlusion. Severe stenosis of the right M2 segment supplying the right parietal lobe. 5. These results were called by telephone at the time of interpretation on 09/02/2019 at 2:35 pm to provider [facility] , who verbally acknowledged these results. Electronically Signed   By: Franchot Gallo M.D.   On: 09/02/2019 14:35   CT CEREBRAL PERFUSION W CONTRAST  Result Date:  09/02/2019 CLINICAL DATA:  Stroke.  Last seen normal 1200 hours. EXAM: CT ANGIOGRAPHY HEAD AND NECK CT PERFUSION BRAIN TECHNIQUE: Multidetector CT imaging of the head and neck was performed using the standard protocol during bolus administration of intravenous contrast. Multiplanar CT image reconstructions and MIPs were obtained to evaluate the vascular anatomy. Carotid stenosis measurements (when applicable) are obtained utilizing NASCET criteria, using the distal internal carotid diameter as the denominator. Multiphase CT imaging of the brain was performed following IV bolus contrast injection. Subsequent parametric perfusion maps were calculated using RAPID software. CONTRAST:  123mL OMNIPAQUE IOHEXOL 350 MG/ML SOLN COMPARISON:  CT head 09/02/2019 FINDINGS: CTA NECK FINDINGS Aortic arch: Standard branching. Imaged portion shows no evidence of aneurysm or dissection. No significant stenosis of the major arch vessel origins. Right carotid system: Right carotid widely patent without significant stenosis. Minimal atherosclerotic disease at the carotid bifurcation Left carotid system: Left carotid widely patent. Negative for atherosclerotic disease or stenosis. Vertebral arteries: Left vertebral dominant. Left vertebral artery patent to the basilar without stenosis. Small right vertebral artery which appears to be highly stenotic  or possibly occluded at the skull base. Skeleton: Cervical spondylosis.  No acute skeletal abnormality. Other neck: Negative for mass or adenopathy in the neck. Upper chest: Lung apices clear bilaterally. Review of the MIP images confirms the above findings CTA HEAD FINDINGS Anterior circulation: Atherosclerotic calcification throughout the cavernous carotid bilaterally. Moderate stenosis bilaterally. Anterior and middle cerebral arteries patent without occlusion. Atherosclerotic diffuse narrowing and M1 and M2 segments bilaterally. Severe stenosis right M2 branch supplying the right  parietal lobe. Posterior circulation: Left vertebral artery supplies the basilar with mild stenosis at the skull base. Small right vertebral artery probably occluded distally although could have a tiny branch supplying the basilar. Basilar patent with mild atherosclerotic stenosis. Superior cerebellar arteries are diseased but without significant stenosis. Venous sinuses: Lack of significant venous contrast due to arterial phase scanning Anatomic variants: Enlarged sella with pituitary lesion measuring 13 mm. Review of the MIP images confirms the above findings CT Brain Perfusion Findings: ASPECTS: 10 CBF (<30%) Volume: 50mL Perfusion (Tmax>6.0s) volume: 129mL Mismatch Volume: 134mL Infarction Location:Multiple foci of delayed perfusion. The largest areas in the right parietal lobe. Other areas include the right frontal lobe, and cerebellum bilaterally. IMPRESSION: 1. CT perfusion negative for core infarct. 104 mL of delayed perfusion. Multiple areas of delayed perfusion or seen most prominently in the right parietal lobe. Other areas could be ischemic or artifactual. 2. No significant carotid stenosis in the neck. 3. Left vertebral artery dominant. Severe stenosis or occlusion distal right vertebral artery. 4. Diffuse intracranial atherosclerotic disease without large vessel occlusion. Severe stenosis of the right M2 segment supplying the right parietal lobe. 5. These results were called by telephone at the time of interpretation on 09/02/2019 at 2:35 pm to provider [facility] , who verbally acknowledged these results. Electronically Signed   By: Franchot Gallo M.D.   On: 09/02/2019 14:35   DG Abd 2 Views  Result Date: 08/15/2019 CLINICAL DATA:  History of diarrhea and possible fecal impaction EXAM: ABDOMEN - 2 VIEW COMPARISON:  03/13/2019 FINDINGS: Scattered large and small bowel gas is noted. No free air is seen. There is significant fecal material identified in the rectosigmoid consistent with a fecal  impaction. Chronic degenerative changes of lumbar spine are noted with a scoliosis concave to the left. IMPRESSION: Changes consistent with significant rectosigmoid fecal impaction. Electronically Signed   By: Inez Catalina M.D.   On: 08/15/2019 00:35   CT HEAD CODE STROKE WO CONTRAST  Result Date: 09/02/2019 CLINICAL DATA:  Code stroke. 85 year old male last seen normal 1200 hours. EXAM: CT HEAD WITHOUT CONTRAST TECHNIQUE: Contiguous axial images were obtained from the base of the skull through the vertex without intravenous contrast. COMPARISON:  Head CT 03/10/2019. Brain MRI 05/06/2018. FINDINGS: Brain: No acute intracranial hemorrhage identified. No midline shift, mass effect, or evidence of intracranial mass lesion. No ventriculomegaly. Widespread and confluent hypodensity in the bilateral cerebral white matter appears stable since June. Stable chronic lacunar infarct in the right thalamus. No cortically based acute infarct identified. Rounded 13 millimeter intra sellar pituitary mass appears stable since 2019. Vascular: Calcified atherosclerosis at the skull base. No suspicious intracranial vascular hyperdensity. Skull: No acute osseous abnormality identified. Sinuses/Orbits: Visualized paranasal sinuses and mastoids are stable and well pneumatized. Other: No acute orbit or scalp soft tissue finding. ASPECTS [facility] Stroke Program Early CT Score) - Ganglionic level infarction (caudate, lentiform nuclei, internal capsule, insula, M1-M3 cortex): - Supraganglionic infarction (M4-M6 cortex): Total score (0-10 with 10 being normal): IMPRESSION: 1. Stable non contrast  CT appearance of advanced chronic small vessel disease since June. No acute intracranial hemorrhage. ASPECTS 10. 2. 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 3. These results were communicated to Dr. Erlinda Hong at [place] 12/13/2020by text page via the [facility] messaging system. Electronically Signed   By: Genevie Ann M.D.   On: 09/02/2019 13:57     Assessment: 85 y.o. male with PMH of CAD, HTN, HLD, advance PD and PD dementia, PE on warfarin, CHF, right cerebellum ICH in the setting with coumadin presented as code stroke for increased drooling on the left and dysphagia out of his basline.  Patient has very limited baseline neuro function due to advanced PD, PD dementia and previous right cerebellum small ICH.  Current worsening of symptoms could be due to stroke, seizure, or metabolic encephalopathy.  Patient not TPA candidate given therapeutic INR, history of ICH.  CTA head and neck showed right M2 mid to distal portion high-grade stenosis, could be chronic vs acute.  CT perfusion showed some right side penumbra however, overall artifactual.  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  Will recommend MRI brain, EEG, as well as encephalopathy work-up.  Plan: - admission for further work up - HgbA1c, fasting lipid panel - MRI of the brain without contrast - EEG routine - Metabolic encephalopathy work-up - Recommend NG tube for continued Sinemet for PD - PT consult, OT consult, Speech consult - Echocardiogram - current INR 2.6, will hold off coumadin for now and will decide after MRI resulted - Risk factor modification - Telemetry monitoring - Frequent neuro checks - we will follow  Thank you for this consultation and allowing [name] to participate in the care of this patient. I had long discussion with daughter, updated pt current condition, treatment plan and potential prognosis, and answered all the questions. She expressed understanding and appreciation.    Rosalin Hawking, MD PhD Stroke Neurology 09/02/2019 3:40 PM

## 2019-09-02 NOTE — Progress Notes (Addendum)
Date: 09/02/2019               Patient Name:  [name] [name] MRN: [id number]  DOB: 09-21-1930 Age / Sex: 85 y.o., male   PCP: [name], DO         Requesting Physician: Dr. Julianne Rice, MD    Consulting Reason:  Stroke workup      Chief Complaint: Drooling  History of Present Illness: Patient unable to participate in history.  History obtained through phone call with daughter, who is POA and caregiver.  Son is also present at the bedside.  Mr. Paganini is an 85 year old male with past medical history significant for Parkinson disease, dementia, prostate cancer CAD, MI status post multivessel cath in 2008, GERD, HTN, and HLD who presented to the ED after an episode of drooling.  The patient's daughter stated that he was in his wheelchair earlier today in she noticed he had a copious amount of drooling.  She has never seen him drool this much before and was concerned he was having a stroke. She states in retrospect, she thinks he was leaning forward in his wheelchair, and that was what was causing the drooling. Otherwise, she denied him having any facial droop, new weakness, or seizure-like activity that she could appreciate.  She states that she wanted to make sure he did not have a new stroke or infection, but would prefer that he would not be admitted for an extensive work-up.  She wishes to take him home and continued giving the 24/7 care that she already provides.  In the ED, a CBC, CMP, UA were obtained and were all unremarkable.  Code stroke was initiated and the patient received a CT of the head as well as a CT angio of the head and neck, which did not demonstrate any acute abnormalities.  Neurology also saw the patient in the ED.  Meds: No current facility-administered medications for this encounter.   Current Outpatient Medications  Medication Sig Dispense Refill  . acetaminophen (TYLENOL) 500 MG tablet Take 1,000 mg by mouth 2 (two) times daily.     [name] amLODipine  (NORVASC) 2.5 MG tablet Take 0.5 tablets (1.25 mg total) by mouth daily. 45 tablet 1  . b complex vitamins tablet Take 1 tablet by mouth daily.    . Ca Phosphate-Cholecalciferol (CALCIUM/VITAMIN D3 GUMMIES) 250-350 MG-UNIT CHEW Chew 1 Dose by mouth 2 (two) times daily. 180 tablet 3  . carbidopa-levodopa (SINEMET IR) 25-100 MG tablet Take 1 tablet by mouth 4 (four) times daily. 120 tablet 3  . loratadine (CLARITIN) 10 MG tablet Take 10 mg by mouth daily.    . memantine (NAMENDA XR) 28 MG CP24 24 hr capsule TAKE 1 CAPSULE BY MOUTH EVERY DAY IN THE MORNING (Patient taking differently: Take 28 mg by mouth daily. ) 90 capsule 1  . Multiple Vitamins-Minerals (MULTIVITAMIN WITH MINERALS) tablet Take 1 tablet by mouth daily.     [name] omega-3 acid ethyl esters (LOVAZA) 1 g capsule Take 1 g by mouth daily.    [name] omeprazole (PRILOSEC) 10 MG capsule Take 10 mg by mouth daily as needed (for heartburn). OTC     . polyethylene glycol (MIRALAX / GLYCOLAX) packet Take 17 g by mouth daily as needed (constipation).    . potassium chloride SA (KLOR-CON M20) 20 MEQ tablet Take 2 tablets (40 mEq total) by mouth daily. 180 tablet 1  . senna (SENOKOT) 8.6 MG TABS tablet Take 1 tablet (8.6 mg total) by mouth daily  as needed for mild constipation. 120 each 0  . warfarin (COUMADIN) 5 MG tablet TAKE 1/2 TABLET BY MOUTH EVERY THU, AND SAT AND 1 TABLET BY MOUTH ALL OTHER DAYS (Patient taking differently: Take 2.5-5 mg by mouth See admin instructions. TAKE 0.5 TABLET (2.5mg  totally) BY MOUTH EVERY THUR AND SAT; 1 TABLET (5 mg totally) BY MOUTH on SUN, MON, TUES, WED, AND FRIDAY) 90 tablet 1  . Zinc Oxide 10 % OINT Apply 1 application topically daily. On penis    . atorvastatin (LIPITOR) 10 MG tablet Take 1 tablet (10 mg total) by mouth daily at 6 PM. (Patient not taking: Reported on 08/20/2019) 90 tablet 1  . STARCH-MALTO DEXTRIN ([employer] INSTANT FOOD THICKENER) POWD Take 1 Scoop by mouth daily as needed. (Patient not taking: Reported on  09/02/2019) 1000 g 3   Allergies: Allergies as of 09/02/2019 - Review Complete 09/02/2019  Allergen Reaction Noted  . Aricept [donepezil hcl] Other (See Comments) 07/24/2013  . Shellfish allergy Other (See Comments) 12/01/2015   Past Medical History:  Diagnosis Date  . Anxiety   . Coronary artery disease, non-occlusive    with history of MI; Cath 2008 w multivessel nonobstructive CAD  . Dementia ([place])   . Depression   . GERD (gastroesophageal reflux disease)   . Hyperlipidemia   . Hypertension   . Memory loss   . Parkinson disease ([place])   . PE (pulmonary embolism)    unprovoked PE completed 6 months of warfarin, warfarin d/ced 02/12/2010, repeat PE 07/10/2010 after a long car ride and now on lifelong coumadin.  . Pituitary microadenoma ([place])    incidental finding CT 12/2009  . Prostate cancer ([place])   . Scoliosis   . Sigmoid volvulus ([place]) 08/02/2016  . Stroke ([place]) 03/2018  . Volvulus of colon ([place]) 06/01/2017   Past Surgical History:  Procedure Laterality Date  . CATARACT EXTRACTION    . COLONOSCOPY N/A 05/30/2017   Procedure: COLONOSCOPY;  Surgeon: Ronald Lobo, MD;  Location[facility] END[facility]COPY;  Service: Endoscopy;  Laterality: N/A;  . FLEXIBLE SIGMOIDOSCOPY N/A 08/02/2016   Procedure: FLEXIBLE SIGMOIDOSCOPY;  Surgeon: Ronald Lobo, MD;  Location[facility] ENDOSCOPY;  Service: Endoscopy;  Laterality: N/A;  . FLEXIBLE SIGMOIDOSCOPY N/A 08/06/2016   Procedure: FLEXIBLE SIGMOIDOSCOPY;  Surgeon: Ronald Lobo, MD;  Location: [facility] ENDOSCOPY;  Service: Endoscopy;  Laterality: N/A;  . FLEXIBLE SIGMOIDOSCOPY Left 06/02/2017   Procedure: FLEXIBLE SIGMOIDOSCOPY;  Surgeon: Arta Silence, MD;  Location: [facility] ENDOSCOPY;  Service: Endoscopy;  Laterality: Left;  . FLEXIBLE SIGMOIDOSCOPY N/A 06/07/2017   Procedure: FLEXIBLE SIGMOIDOSCOPY for possible decompression;  Surgeon: Otis Brace, MD;  Location: [place];  Service: Gastroenterology;  Laterality: N/A;  . LEFT HEART CATHETERIZATION  WITH CORONARY ANGIOGRAM N/A 12/10/2014   Procedure: LEFT HEART CATHETERIZATION WITH CORONARY ANGIOGRAM;  Surgeon: [name], MD;  Location: [facility] CATH LAB;  Service: Cardiovascular;  Laterality: N/A;  . LOOP RECORDER IMPLANT N/A 12/16/2014   Procedure: LOOP RECORDER IMPLANT;  Surgeon: [name], MD;  Location: [facility] CATH LAB;  Service: Cardiovascular;  Laterality: N/A;  . PARTIAL COLECTOMY N/A 06/08/2017   Procedure: SIGMOID  COLECTOMY;  Surgeon: Georganna Skeans, MD;  Location: [place];  Service: General;  Laterality: N/A;  . PROSTATECTOMY     Family History  Problem Relation Age of Onset  . Hypertension Father        Passed away from cerebral hemorrhage at age of 63.  [name] Dementia Mother   . Hypertension Child  4 adult children  . Cervical cancer Other   . Lung cancer Other   . Stroke Other   . Heart attack Neg Hx    Social History   Socioeconomic History  . Marital status: Married    Spouse name: Not on file  . Number of children: 4  . Years of education: master's  . Highest education level: Not on file  Occupational History  . Occupation: Retired    Fish farm manager: RETIRED  Tobacco Use  . Smoking status: Never Smoker  . Smokeless tobacco: Never Used  Substance and Sexual Activity  . Alcohol use: No    Alcohol/week: 0.0 standard drinks  . Drug use: No  . Sexual activity: Never  Other Topics Concern  . Not on file  Social History Narrative   Lives with daughter. Wife also has severe dementia.    Drinks 1 cup of coffee a day    Social Determinants of Health   Financial Resource Strain:   . Difficulty of Paying Living Expenses: Not on file  Food Insecurity:   . Worried About Charity fundraiser in the Last Year: Not on file  . Ran Out of Food in the Last Year: Not on file  Transportation Needs:   . Lack of Transportation (Medical): Not on file  . Lack of Transportation (Non-Medical): Not on file  Physical Activity:   . Days of Exercise per Week: Not on file  .  Minutes of Exercise per Session: Not on file  Stress:   . Feeling of Stress : Not on file  Social Connections:   . Frequency of Communication with Friends and Family: Not on file  . Frequency of Social Gatherings with Friends and Family: Not on file  . Attends Religious Services: Not on file  . Active Member of Clubs or Organizations: Not on file  . Attends Archivist Meetings: Not on file  . Marital Status: Not on file  Intimate Partner Violence:   . Fear of Current or Ex-Partner: Not on file  . Emotionally Abused: Not on file  . Physically Abused: Not on file  . Sexually Abused: Not on file   Review of Systems: All systems were reviewed and are otherwise negative unless mentioned in the HPI.  Physical Exam: Blood pressure (!) 167/99, pulse 72, temperature 97.6 F (36.4 C), temperature source Oral, resp. rate 14, SpO2 94 %. Physical Exam Vitals reviewed.  Constitutional:      General: He is not in acute distress.    Appearance: He is not ill-appearing, toxic-appearing or diaphoretic.  HENT:     Head: Normocephalic and atraumatic.     Mouth/Throat:     Mouth: Mucous membranes are moist.     Pharynx: No oropharyngeal exudate or posterior oropharyngeal erythema.  Eyes:     General: No scleral icterus.       Right eye: No discharge.        Left eye: No discharge.     Extraocular Movements: Extraocular movements intact.     Pupils: Pupils are equal, round, and reactive to light.  Cardiovascular:     Rate and Rhythm: Normal rate and regular rhythm.     Pulses: Normal pulses.     Heart sounds: Normal heart sounds. No murmur. No friction rub. No gallop.   Pulmonary:     Effort: Pulmonary effort is normal. No respiratory distress.     Breath sounds: Normal breath sounds. No wheezing or rales.  Abdominal:  General: Abdomen is flat. Bowel sounds are normal. There is no distension.     Palpations: Abdomen is soft.     Tenderness: There is no abdominal tenderness.  There is no guarding.  Musculoskeletal:        General: No swelling or deformity.     Right lower leg: No edema.     Left lower leg: No edema.  Skin:    General: Skin is warm.  Neurological:     Mental Status: Mental status is at baseline.     Comments: Alert, but not oriented.  Does not follow commands.  Unable to assess cranial nerves. Bilateral upper extremity stiffness and cogwheeling.  Withdraws lower extremities to Babinski. No focal deficits    Lab results: CBC    Component Value Date/Time   WBC 3.7 (L) 09/02/2019 1409   RBC 4.42 09/02/2019 1409   HGB 13.7 09/02/2019 1409   HGB 13.1 12/09/2016 1114   HCT 43.2 09/02/2019 1409   HCT 38.1 10/24/2018 1030   PLT 201 09/02/2019 1409   PLT 186 12/09/2016 1114   MCV 97.7 09/02/2019 1409   MCV 91 12/09/2016 1114   MCH 31.0 09/02/2019 1409   MCHC 31.7 09/02/2019 1409   RDW 15.4 09/02/2019 1409   RDW 16.3 (H) 12/09/2016 1114   LYMPHSABS 1.3 09/02/2019 1409   MONOABS 0.3 09/02/2019 1409   EOSABS 0.1 09/02/2019 1409   BASOSABS 0.0 09/02/2019 1409   CMP Latest Ref Rng & Units 09/02/2019 09/02/2019 03/13/2019  Glucose 70 - 99 mg/dL 90 85 -  BUN 8 - 23 mg/dL 10 13 -  Creatinine 0.61 - 1.24 mg/dL 0.56(L) 0.70 0.80  Sodium 135 - 145 mmol/L 142 142 -  Potassium 3.5 - 5.1 mmol/L 4.4 4.3 -  Chloride 98 - 111 mmol/L 108 106 -  CO2 22 - 32 mmol/L 25 - -  Calcium 8.9 - 10.3 mg/dL 9.3 - -  Total Protein 6.5 - 8.1 g/dL 7.0 - -  Total Bilirubin 0.3 - 1.2 mg/dL 1.1 - -  Alkaline Phos 38 - 126 U/L 55 - -  AST 15 - 41 U/L 82(H) - -  ALT 0 - 44 U/L 22 - -   PT/INR = 27.7/2.6  Imaging results:  CT ANGIO HEAD W OR WO CONTRAST  Result Date: 09/02/2019 CLINICAL DATA:  Stroke.  Last seen normal 1200 hours. EXAM: CT ANGIOGRAPHY HEAD AND NECK CT PERFUSION BRAIN TECHNIQUE: Multidetector CT imaging of the head and neck was performed using the standard protocol during bolus administration of intravenous contrast. Multiplanar CT image  reconstructions and MIPs were obtained to evaluate the vascular anatomy. Carotid stenosis measurements (when applicable) are obtained utilizing NASCET criteria, using the distal internal carotid diameter as the denominator. Multiphase CT imaging of the brain was performed following IV bolus contrast injection. Subsequent parametric perfusion maps were calculated using RAPID software. CONTRAST:  19mL OMNIPAQUE IOHEXOL 350 MG/ML SOLN COMPARISON:  CT head 09/02/2019 FINDINGS: CTA NECK FINDINGS Aortic arch: Standard branching. Imaged portion shows no evidence of aneurysm or dissection. No significant stenosis of the major arch vessel origins. Right carotid system: Right carotid widely patent without significant stenosis. Minimal atherosclerotic disease at the carotid bifurcation Left carotid system: Left carotid widely patent. Negative for atherosclerotic disease or stenosis. Vertebral arteries: Left vertebral dominant. Left vertebral artery patent to the basilar without stenosis. Small right vertebral artery which appears to be highly stenotic or possibly occluded at the skull base. Skeleton: Cervical spondylosis.  No acute skeletal abnormality.  Other neck: Negative for mass or adenopathy in the neck. Upper chest: Lung apices clear bilaterally. Review of the MIP images confirms the above findings CTA HEAD FINDINGS Anterior circulation: Atherosclerotic calcification throughout the cavernous carotid bilaterally. Moderate stenosis bilaterally. Anterior and middle cerebral arteries patent without occlusion. Atherosclerotic diffuse narrowing and M1 and M2 segments bilaterally. Severe stenosis right M2 branch supplying the right parietal lobe. Posterior circulation: Left vertebral artery supplies the basilar with mild stenosis at the skull base. Small right vertebral artery probably occluded distally although could have a tiny branch supplying the basilar. Basilar patent with mild atherosclerotic stenosis. Superior  cerebellar arteries are diseased but without significant stenosis. Venous sinuses: Lack of significant venous contrast due to arterial phase scanning Anatomic variants: Enlarged sella with pituitary lesion measuring 13 mm. Review of the MIP images confirms the above findings CT Brain Perfusion Findings: ASPECTS: 10 CBF (<30%) Volume: 59mL Perfusion (Tmax>6.0s) volume: 174mL Mismatch Volume: 168mL Infarction Location:Multiple foci of delayed perfusion. The largest areas in the right parietal lobe. Other areas include the right frontal lobe, and cerebellum bilaterally. IMPRESSION: 1. CT perfusion negative for core infarct. 104 mL of delayed perfusion. Multiple areas of delayed perfusion or seen most prominently in the right parietal lobe. Other areas could be ischemic or artifactual. 2. No significant carotid stenosis in the neck. 3. Left vertebral artery dominant. Severe stenosis or occlusion distal right vertebral artery. 4. Diffuse intracranial atherosclerotic disease without large vessel occlusion. Severe stenosis of the right M2 segment supplying the right parietal lobe. 5. These results were called by telephone at the time of interpretation on 09/02/2019 at 2:35 pm to provider [facility] , who verbally acknowledged these results. Electronically Signed   By: Franchot Gallo M.D.   On: 09/02/2019 14:35   CT ANGIO NECK W OR WO CONTRAST  Result Date: 09/02/2019 CLINICAL DATA:  Stroke.  Last seen normal 1200 hours. EXAM: CT ANGIOGRAPHY HEAD AND NECK CT PERFUSION BRAIN TECHNIQUE: Multidetector CT imaging of the head and neck was performed using the standard protocol during bolus administration of intravenous contrast. Multiplanar CT image reconstructions and MIPs were obtained to evaluate the vascular anatomy. Carotid stenosis measurements (when applicable) are obtained utilizing NASCET criteria, using the distal internal carotid diameter as the denominator. Multiphase CT imaging of the brain was performed  following IV bolus contrast injection. Subsequent parametric perfusion maps were calculated using RAPID software. CONTRAST:  139mL OMNIPAQUE IOHEXOL 350 MG/ML SOLN COMPARISON:  CT head 09/02/2019 FINDINGS: CTA NECK FINDINGS Aortic arch: Standard branching. Imaged portion shows no evidence of aneurysm or dissection. No significant stenosis of the major arch vessel origins. Right carotid system: Right carotid widely patent without significant stenosis. Minimal atherosclerotic disease at the carotid bifurcation Left carotid system: Left carotid widely patent. Negative for atherosclerotic disease or stenosis. Vertebral arteries: Left vertebral dominant. Left vertebral artery patent to the basilar without stenosis. Small right vertebral artery which appears to be highly stenotic or possibly occluded at the skull base. Skeleton: Cervical spondylosis.  No acute skeletal abnormality. Other neck: Negative for mass or adenopathy in the neck. Upper chest: Lung apices clear bilaterally. Review of the MIP images confirms the above findings CTA HEAD FINDINGS Anterior circulation: Atherosclerotic calcification throughout the cavernous carotid bilaterally. Moderate stenosis bilaterally. Anterior and middle cerebral arteries patent without occlusion. Atherosclerotic diffuse narrowing and M1 and M2 segments bilaterally. Severe stenosis right M2 branch supplying the right parietal lobe. Posterior circulation: Left vertebral artery supplies the basilar with mild stenosis at the skull  base. Small right vertebral artery probably occluded distally although could have a tiny branch supplying the basilar. Basilar patent with mild atherosclerotic stenosis. Superior cerebellar arteries are diseased but without significant stenosis. Venous sinuses: Lack of significant venous contrast due to arterial phase scanning Anatomic variants: Enlarged sella with pituitary lesion measuring 13 mm. Review of the MIP images confirms the above findings CT  Brain Perfusion Findings: ASPECTS: 10 CBF (<30%) Volume: 42mL Perfusion (Tmax>6.0s) volume: 110mL Mismatch Volume: 152mL Infarction Location:Multiple foci of delayed perfusion. The largest areas in the right parietal lobe. Other areas include the right frontal lobe, and cerebellum bilaterally. IMPRESSION: 1. CT perfusion negative for core infarct. 104 mL of delayed perfusion. Multiple areas of delayed perfusion or seen most prominently in the right parietal lobe. Other areas could be ischemic or artifactual. 2. No significant carotid stenosis in the neck. 3. Left vertebral artery dominant. Severe stenosis or occlusion distal right vertebral artery. 4. Diffuse intracranial atherosclerotic disease without large vessel occlusion. Severe stenosis of the right M2 segment supplying the right parietal lobe. 5. These results were called by telephone at the time of interpretation on 09/02/2019 at 2:35 pm to provider [facility] , who verbally acknowledged these results. Electronically Signed   By: Franchot Gallo M.D.   On: 09/02/2019 14:35   CT CEREBRAL PERFUSION W CONTRAST  Result Date: 09/02/2019 CLINICAL DATA:  Stroke.  Last seen normal 1200 hours. EXAM: CT ANGIOGRAPHY HEAD AND NECK CT PERFUSION BRAIN TECHNIQUE: Multidetector CT imaging of the head and neck was performed using the standard protocol during bolus administration of intravenous contrast. Multiplanar CT image reconstructions and MIPs were obtained to evaluate the vascular anatomy. Carotid stenosis measurements (when applicable) are obtained utilizing NASCET criteria, using the distal internal carotid diameter as the denominator. Multiphase CT imaging of the brain was performed following IV bolus contrast injection. Subsequent parametric perfusion maps were calculated using RAPID software. CONTRAST:  166mL OMNIPAQUE IOHEXOL 350 MG/ML SOLN COMPARISON:  CT head 09/02/2019 FINDINGS: CTA NECK FINDINGS Aortic arch: Standard branching. Imaged portion shows no  evidence of aneurysm or dissection. No significant stenosis of the major arch vessel origins. Right carotid system: Right carotid widely patent without significant stenosis. Minimal atherosclerotic disease at the carotid bifurcation Left carotid system: Left carotid widely patent. Negative for atherosclerotic disease or stenosis. Vertebral arteries: Left vertebral dominant. Left vertebral artery patent to the basilar without stenosis. Small right vertebral artery which appears to be highly stenotic or possibly occluded at the skull base. Skeleton: Cervical spondylosis.  No acute skeletal abnormality. Other neck: Negative for mass or adenopathy in the neck. Upper chest: Lung apices clear bilaterally. Review of the MIP images confirms the above findings CTA HEAD FINDINGS Anterior circulation: Atherosclerotic calcification throughout the cavernous carotid bilaterally. Moderate stenosis bilaterally. Anterior and middle cerebral arteries patent without occlusion. Atherosclerotic diffuse narrowing and M1 and M2 segments bilaterally. Severe stenosis right M2 branch supplying the right parietal lobe. Posterior circulation: Left vertebral artery supplies the basilar with mild stenosis at the skull base. Small right vertebral artery probably occluded distally although could have a tiny branch supplying the basilar. Basilar patent with mild atherosclerotic stenosis. Superior cerebellar arteries are diseased but without significant stenosis. Venous sinuses: Lack of significant venous contrast due to arterial phase scanning Anatomic variants: Enlarged sella with pituitary lesion measuring 13 mm. Review of the MIP images confirms the above findings CT Brain Perfusion Findings: ASPECTS: 10 CBF (<30%) Volume: 77mL Perfusion (Tmax>6.0s) volume: 173mL Mismatch Volume: 150mL Infarction Location:Multiple foci  of delayed perfusion. The largest areas in the right parietal lobe. Other areas include the right frontal lobe, and cerebellum  bilaterally. IMPRESSION: 1. CT perfusion negative for core infarct. 104 mL of delayed perfusion. Multiple areas of delayed perfusion or seen most prominently in the right parietal lobe. Other areas could be ischemic or artifactual. 2. No significant carotid stenosis in the neck. 3. Left vertebral artery dominant. Severe stenosis or occlusion distal right vertebral artery. 4. Diffuse intracranial atherosclerotic disease without large vessel occlusion. Severe stenosis of the right M2 segment supplying the right parietal lobe. 5. These results were called by telephone at the time of interpretation on 09/02/2019 at 2:35 pm to provider [facility] , who verbally acknowledged these results. Electronically Signed   By: Franchot Gallo M.D.   On: 09/02/2019 14:35   DG Chest Port 1 View  Result Date: 09/02/2019 CLINICAL DATA:  Altered mental status and left-sided facial droop EXAM: PORTABLE CHEST 1 VIEW COMPARISON:  03/10/2019 FINDINGS: Cardiac shadow is stable. Aortic calcifications are again seen. Elevation of the left hemidiaphragm is again noted and stable. No focal infiltrate or sizable effusion is seen. No bony abnormality is noted. IMPRESSION: No acute abnormality noted. Electronically Signed   By: Inez Catalina M.D.   On: 09/02/2019 15:28   CT HEAD CODE STROKE WO CONTRAST  Result Date: 09/02/2019 CLINICAL DATA:  Code stroke. 85 year old male last seen normal 1200 hours. EXAM: CT HEAD WITHOUT CONTRAST TECHNIQUE: Contiguous axial images were obtained from the base of the skull through the vertex without intravenous contrast. COMPARISON:  Head CT 03/10/2019. Brain MRI 05/06/2018. FINDINGS: Brain: No acute intracranial hemorrhage identified. No midline shift, mass effect, or evidence of intracranial mass lesion. No ventriculomegaly. Widespread and confluent hypodensity in the bilateral cerebral white matter appears stable since June. Stable chronic lacunar infarct in the right thalamus. No cortically based acute  infarct identified. Rounded 13 millimeter intra sellar pituitary mass appears stable since 2019. Vascular: Calcified atherosclerosis at the skull base. No suspicious intracranial vascular hyperdensity. Skull: No acute osseous abnormality identified. Sinuses/Orbits: Visualized paranasal sinuses and mastoids are stable and well pneumatized. Other: No acute orbit or scalp soft tissue finding. ASPECTS [facility] Stroke Program Early CT Score) - Ganglionic level infarction (caudate, lentiform nuclei, internal capsule, insula, M1-M3 cortex): - Supraganglionic infarction (M4-M6 cortex): Total score (0-10 with 10 being normal): IMPRESSION: 1. Stable non contrast CT appearance of advanced chronic small vessel disease since June. No acute intracranial hemorrhage. ASPECTS 10. 2. 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 3. These results were communicated to Dr. Erlinda Hong at [facility] 12/13/2020by text page via the [facility] messaging system. Electronically Signed   By: Genevie Ann M.D.   On: 09/02/2019 13:57   EKG: Atrial fibrillation with rapid V-rate Ventricular tachycardia, unsustained Posterior infarct, old Repolarization abnormality, prob rate related  Assessment, Plan, & Recommendations by Problem: Active Problems:   * No active hospital problems. *  In summary, Mr Gens is an 85 year old male with past medical history significant for Parkinson disease, dementia, prostate cancer CAD, MI status post multivessel cath in 2008, GERD, HTN, and HLD who presented to the ED after an episode of drooling. Neurology has seen the patient and recommended MRI and EEG for further work-up, however the patient's daughter, the healthcare power of attorney, states that she would rather take him home to provide 24-hour care and have him follow-up in outpatient setting. The patient does not have any focal deficits on physical exam that concerns me for  an acute stroke, or to suggest he is postictal.  Both she and the son who is currently at the  bedside say he is at his baseline.  #Dipso -Discharge from the ED and have patient follow-up in the clinic to review test results.  Earlene Plater, MD Internal Medicine, PGY1 Pager: [phone]  09/02/2019,4:48 PM

## 2019-09-02 NOTE — ED Provider Notes (Addendum)
[facility] EMERGENCY DEPARTMENT Provider Note   CSN: [id number] Arrival date & time: 09/02/19  1326  An emergency department physician performed an initial assessment on this suspected stroke patient at 1330.  History Chief Complaint  Patient presents with  . Code Stroke    [name] [name] is a 85 y.o. male.  HPI    Patient presents as code stroke.  Patient has history of previous intracranial hemorrhage in the setting of Coumadin last year.  Has some residual left-sided weakness.  Severe Parkinson's disease.  Per daughter patient with left-sided facial droop and drooling as well as aphasia starting at 1230 today.  Patient was able to speak with limited words.  He is not following commands or contributing history.  Level 5 caveat applies.   Past Medical History:  Diagnosis Date  . Anxiety   . Coronary artery disease, non-occlusive    with history of MI; Cath 2008 w multivessel nonobstructive CAD  . Dementia ([place])   . Depression   . GERD (gastroesophageal reflux disease)   . Hyperlipidemia   . Hypertension   . Memory loss   . Parkinson disease ([place])   . PE (pulmonary embolism)    unprovoked PE completed 6 months of warfarin, warfarin d/ced 02/12/2010, repeat PE 07/10/2010 after a long car ride and now on lifelong coumadin.  . Pituitary microadenoma ([place])    incidental finding CT 12/2009  . Prostate cancer ([place])   . Scoliosis   . Sigmoid volvulus ([place]) 08/02/2016  . Stroke ([place]) 03/2018  . Volvulus of colon ([place]) 06/01/2017    Patient Active Problem List   Diagnosis Date Noted  . Altered mental status   . Dehydration   . Hyperchloremia   . Hypernatremia 10/23/2018  . Ecchymosis 08/07/2018  . Pituitary macroadenoma ([place]) 06/06/2018  . Hemorrhagic stroke ([place])   . Prediabetes   . Dysphagia   . HTN (hypertension), benign   . Anemia of chronic disease   . ICH (intracerebral hemorrhage) ([place]) 05/05/2018  . [place]azion left upper eyelid 04/11/2018    . Lethargy 11/10/2017  . Fatigue 09/08/2017  . Need for immunization against influenza 09/08/2017  . Recurrent UTI 06/27/2017  . Pressure sore on buttocks 06/27/2017  . Volvulus of sigmoid colon ([place]) 05/31/2017  . Aortic atherosclerosis ([place]) 05/31/2017  . Loss of weight 09/23/2016  . Chronic anticoagulation   . Right bundle branch block   . Hypokalemia 08/02/2016  . Diverticulosis 07/15/2016  . Statin myopathy 03/15/2016  . Parkinsonian syndrome ([place]) 08/11/2015  . Normocytic anemia, not due to blood loss 04/16/2015  . Chronic systolic heart failure ([place]) 04/16/2015  . Falls frequently 04/16/2015  . Onychomycosis 04/15/2015  . CAD (coronary artery disease) 01/16/2015  . 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 Healthcare maintenance 10/10/2014  . Lumbosacral spondylosis without myelopathy 09/24/2013  . Bradycardia 02/20/2013  . Generalized ischemic cerebrovascular disease 02/05/2013  . Depression 01/25/2013  . Insomnia 01/20/2012  . Alzheimer's dementia ([place]) 08/17/2011  . History of pulmonary embolus (PE) 09/18/2010  . Encounter for long-term use of antiplatelets/antithrombotics 09/18/2010  . DJD (degenerative joint disease) 08/06/2010  . ADENOCARCINOMA, PROSTATE, HX OF 08/06/2010  . HLD (hyperlipidemia) 10/15/2009  . Essential hypertension, benign 10/15/2009  . GERD 10/15/2009    Past Surgical History:  Procedure Laterality Date  . CATARACT EXTRACTION    . COLONOSCOPY N/A 05/30/2017   Procedure: COLONOSCOPY;  Surgeon: Ronald Lobo, MD;  Location[facility] END[facility]COPY;  Service: Endoscopy;  Laterality: N/A;  . FLEXIBLE SIGMOIDOSCOPY N/A 08/02/2016  Procedure: FLEXIBLE SIGMOIDOSCOPY;  Surgeon: Ronald Lobo, MD;  Location: [facility] ENDOSCOPY;  Service: Endoscopy;  Laterality: N/A;  . FLEXIBLE SIGMOIDOSCOPY N/A 08/06/2016   Procedure: FLEXIBLE SIGMOIDOSCOPY;  Surgeon: Ronald Lobo, MD;  Location: [facility] ENDOSCOPY;  Service: Endoscopy;  Laterality: N/A;  . FLEXIBLE SIGMOIDOSCOPY Left  06/02/2017   Procedure: FLEXIBLE SIGMOIDOSCOPY;  Surgeon: Arta Silence, MD;  Location: [facility] ENDOSCOPY;  Service: Endoscopy;  Laterality: Left;  . FLEXIBLE SIGMOIDOSCOPY N/A 06/07/2017   Procedure: FLEXIBLE SIGMOIDOSCOPY for possible decompression;  Surgeon: Otis Brace, MD;  Location: [facility];  Service: Gastroenterology;  Laterality: N/A;  . LEFT HEART CATHETERIZATION WITH CORONARY ANGIOGRAM N/A 12/10/2014   Procedure: LEFT HEART CATHETERIZATION WITH CORONARY ANGIOGRAM;  Surgeon: [name], MD;  Location: [facility] CATH LAB;  Service: Cardiovascular;  Laterality: N/A;  . LOOP RECORDER IMPLANT N/A 12/16/2014   Procedure: LOOP RECORDER IMPLANT;  Surgeon: [name], MD;  Location: [facility] CATH LAB;  Service: Cardiovascular;  Laterality: N/A;  . PARTIAL COLECTOMY N/A 06/08/2017   Procedure: SIGMOID  COLECTOMY;  Surgeon: Georganna Skeans, MD;  Location: [facility];  Service: General;  Laterality: N/A;  . PROSTATECTOMY         Family History  Problem Relation Age of Onset  . Hypertension Father        Passed away from cerebral hemorrhage at age of 48.  [name] Dementia Mother   . Hypertension Child        4 adult children  . Cervical cancer Other   . Lung cancer Other   . Stroke Other   . Heart attack Neg Hx     Social History   Tobacco Use  . Smoking status: Never Smoker  . Smokeless tobacco: Never Used  Substance Use Topics  . Alcohol use: No    Alcohol/week: 0.0 standard drinks  . Drug use: No    Home Medications Prior to Admission medications   Medication Sig Start Date End Date Taking? Authorizing Provider  acetaminophen (TYLENOL) 500 MG tablet Take 1,000 mg by mouth 2 (two) times daily.    Yes [provider]  amLODipine (NORVASC) 2.5 MG tablet Take 0.5 tablets (1.25 mg total) by mouth daily. 02/22/19  Yes [name], DO  b complex vitamins tablet Take 1 tablet by mouth daily.   Yes [provider]  Ca Phosphate-Cholecalciferol (CALCIUM/VITAMIN D3 GUMMIES)  250-350 MG-UNIT CHEW Chew 1 Dose by mouth 2 (two) times daily. 07/08/16  Yes [name], DO  carbidopa-levodopa (SINEMET IR) 25-100 MG tablet Take 1 tablet by mouth 4 (four) times daily. 08/20/19  Yes McCue, Janett Billow, NP  loratadine (CLARITIN) 10 MG tablet Take 10 mg by mouth daily.   Yes [provider]  memantine (NAMENDA XR) 28 MG CP24 24 hr capsule TAKE 1 CAPSULE BY MOUTH EVERY DAY IN THE MORNING Patient taking differently: Take 28 mg by mouth daily.  03/08/19  Yes Garvin Fila, MD  Multiple Vitamins-Minerals (MULTIVITAMIN WITH MINERALS) tablet Take 1 tablet by mouth daily.    Yes [provider]  omega-3 acid ethyl esters (LOVAZA) 1 g capsule Take 1 g by mouth daily.   Yes [provider]  omeprazole (PRILOSEC) 10 MG capsule Take 10 mg by mouth daily as needed (for heartburn). OTC    Yes [provider]  polyethylene glycol (MIRALAX / GLYCOLAX) packet Take 17 g by mouth daily as needed (constipation).   Yes [provider]  potassium chloride SA (KLOR-CON M20) 20 MEQ tablet Take 2 tablets (  40 mEq total) by mouth daily. 03/12/19  Yes [name], DO  senna (SENOKOT) 8.6 MG TABS tablet Take 1 tablet (8.6 mg total) by mouth daily as needed for mild constipation. 07/13/16  Yes Velna Ochs, MD  warfarin (COUMADIN) 5 MG tablet TAKE 1/2 TABLET BY MOUTH EVERY THU, AND SAT AND 1 TABLET BY MOUTH ALL OTHER DAYS Patient taking differently: Take 2.5-5 mg by mouth See admin instructions. TAKE 0.5 TABLET (2.5mg  totally) BY MOUTH EVERY THUR AND SAT; 1 TABLET (5 mg totally) BY MOUTH on SUN, MON, TUES, WED, AND FRIDAY 07/16/19  Yes Pennie Banter, RPH-CPP  Zinc Oxide 10 % OINT Apply 1 application topically daily. On penis   Yes [provider]  atorvastatin (LIPITOR) 10 MG tablet Take 1 tablet (10 mg total) by mouth daily at 6 PM. Patient not taking: Reported on 08/20/2019 06/19/18   [name], DO  STARCH-MALTO DEXTRIN ([employer] INSTANT FOOD  THICKENER) POWD Take 1 Scoop by mouth daily as needed. Patient not taking: Reported on 09/02/2019 09/15/18   Ledell Noss, MD    Allergies    Aricept Reather [place]ler hcl] and Shellfish allergy  Review of Systems   Review of Systems  Unable to perform ROS: Dementia    Physical Exam Updated Vital Signs BP (!) 167/99   Pulse 70   Temp 97.6 F (36.4 C) (Oral)   Resp 16   SpO2 97%   Physical Exam Vitals and nursing note reviewed.  Constitutional:      Appearance: He is well-developed.     Comments: Lying with his eyes closed.  Resisting exam.  HENT:     Head: Normocephalic and atraumatic.     Comments: No obvious trauma    Nose: Nose normal.     Mouth/Throat:     Mouth: Mucous membranes are moist.  Cardiovascular:     Rate and Rhythm: Normal rate and regular rhythm.     Heart sounds: No murmur. No friction rub. No gallop.   Pulmonary:     Effort: Pulmonary effort is normal. No respiratory distress.     Breath sounds: Normal breath sounds. No stridor. No wheezing, rhonchi or rales.  Chest:     Chest wall: No tenderness.  Abdominal:     General: Bowel sounds are normal.     Palpations: Abdomen is soft.     Tenderness: There is no abdominal tenderness. There is no guarding or rebound.  Musculoskeletal:        General: No tenderness. Normal range of motion.     Cervical back: Normal range of motion and neck supple. No rigidity or tenderness.  Lymphadenopathy:     Cervical: No cervical adenopathy.  Skin:    General: Skin is warm and dry.     Capillary Refill: Capillary refill takes less than 2 seconds.     Findings: No erythema or rash.  Neurological:     Comments: Patient is not following commands or answering questions.  He is actively resisting exam.  Appears to be moving both his arms with mild weakness in the left compared to the right     ED Results / Procedures / Treatments   Labs (all labs ordered are listed, but only abnormal results are displayed) Labs Reviewed    PROTIME-INR - Abnormal; Notable for the following components:      Result Value   Prothrombin Time 27.7 (*)    INR 2.6 (*)    All other components within normal limits  APTT -  Abnormal; Notable for the following components:   aPTT 40 (*)    All other components within normal limits  CBC - Abnormal; Notable for the following components:   WBC 3.7 (*)    All other components within normal limits  COMPREHENSIVE METABOLIC PANEL - Abnormal; Notable for the following components:   Creatinine, Ser 0.56 (*)    Albumin 3.3 (*)    AST 82 (*)    All other components within normal limits  SARS CORONAVIRUS 2 (TAT 6-24 HRS)  DIFFERENTIAL  URINALYSIS, ROUTINE W REFLEX MICROSCOPIC  I-STAT CHEM 8, ED  CBG MONITORING, ED    EKG EKG Interpretation  Date/Time:  Sunday September 02 2019 14:37:18 EST Ventricular Rate:  166 PR Interval:    QRS Duration: 105 QT Interval:  267 QTC Calculation: 440 R Axis:   57 Text Interpretation: Confirmed by Julianne Rice [phone]) on 09/02/2019 4:01:18 PM   Radiology CT ANGIO HEAD W OR WO CONTRAST  Result Date: 09/02/2019 CLINICAL DATA:  Stroke.  Last seen normal 1200 hours. EXAM: CT ANGIOGRAPHY HEAD AND NECK CT PERFUSION BRAIN TECHNIQUE: Multidetector CT imaging of the head and neck was performed using the standard protocol during bolus administration of intravenous contrast. Multiplanar CT image reconstructions and MIPs were obtained to evaluate the vascular anatomy. Carotid stenosis measurements (when applicable) are obtained utilizing NASCET criteria, using the distal internal carotid diameter as the denominator. Multiphase CT imaging of the brain was performed following IV bolus contrast injection. Subsequent parametric perfusion maps were calculated using RAPID software. CONTRAST:  167mL OMNIPAQUE IOHEXOL 350 MG/ML SOLN COMPARISON:  CT head 09/02/2019 FINDINGS: CTA NECK FINDINGS Aortic arch: Standard branching. Imaged portion shows no evidence of aneurysm  or dissection. No significant stenosis of the major arch vessel origins. Right carotid system: Right carotid widely patent without significant stenosis. Minimal atherosclerotic disease at the carotid bifurcation Left carotid system: Left carotid widely patent. Negative for atherosclerotic disease or stenosis. Vertebral arteries: Left vertebral dominant. Left vertebral artery patent to the basilar without stenosis. Small right vertebral artery which appears to be highly stenotic or possibly occluded at the skull base. Skeleton: Cervical spondylosis.  No acute skeletal abnormality. Other neck: Negative for mass or adenopathy in the neck. Upper chest: Lung apices clear bilaterally. Review of the MIP images confirms the above findings CTA HEAD FINDINGS Anterior circulation: Atherosclerotic calcification throughout the cavernous carotid bilaterally. Moderate stenosis bilaterally. Anterior and middle cerebral arteries patent without occlusion. Atherosclerotic diffuse narrowing and M1 and M2 segments bilaterally. Severe stenosis right M2 branch supplying the right parietal lobe. Posterior circulation: Left vertebral artery supplies the basilar with mild stenosis at the skull base. Small right vertebral artery probably occluded distally although could have a tiny branch supplying the basilar. Basilar patent with mild atherosclerotic stenosis. Superior cerebellar arteries are diseased but without significant stenosis. Venous sinuses: Lack of significant venous contrast due to arterial phase scanning Anatomic variants: Enlarged sella with pituitary lesion measuring 13 mm. Review of the MIP images confirms the above findings CT Brain Perfusion Findings: ASPECTS: 10 CBF (<30%) Volume: 38mL Perfusion (Tmax>6.0s) volume: 157mL Mismatch Volume: 144mL Infarction Location:Multiple foci of delayed perfusion. The largest areas in the right parietal lobe. Other areas include the right frontal lobe, and cerebellum bilaterally.  IMPRESSION: 1. CT perfusion negative for core infarct. 104 mL of delayed perfusion. Multiple areas of delayed perfusion or seen most prominently in the right parietal lobe. Other areas could be ischemic or artifactual. 2. No significant carotid stenosis in the neck.  3. Left vertebral artery dominant. Severe stenosis or occlusion distal right vertebral artery. 4. Diffuse intracranial atherosclerotic disease without large vessel occlusion. Severe stenosis of the right M2 segment supplying the right parietal lobe. 5. These results were called by telephone at the time of interpretation on 09/02/2019 at 2:35 pm to provider [facility] , who verbally acknowledged these results. Electronically Signed   By: Franchot Gallo M.D.   On: 09/02/2019 14:35   CT ANGIO NECK W OR WO CONTRAST  Result Date: 09/02/2019 CLINICAL DATA:  Stroke.  Last seen normal 1200 hours. EXAM: CT ANGIOGRAPHY HEAD AND NECK CT PERFUSION BRAIN TECHNIQUE: Multidetector CT imaging of the head and neck was performed using the standard protocol during bolus administration of intravenous contrast. Multiplanar CT image reconstructions and MIPs were obtained to evaluate the vascular anatomy. Carotid stenosis measurements (when applicable) are obtained utilizing NASCET criteria, using the distal internal carotid diameter as the denominator. Multiphase CT imaging of the brain was performed following IV bolus contrast injection. Subsequent parametric perfusion maps were calculated using RAPID software. CONTRAST:  145mL OMNIPAQUE IOHEXOL 350 MG/ML SOLN COMPARISON:  CT head 09/02/2019 FINDINGS: CTA NECK FINDINGS Aortic arch: Standard branching. Imaged portion shows no evidence of aneurysm or dissection. No significant stenosis of the major arch vessel origins. Right carotid system: Right carotid widely patent without significant stenosis. Minimal atherosclerotic disease at the carotid bifurcation Left carotid system: Left carotid widely patent. Negative for  atherosclerotic disease or stenosis. Vertebral arteries: Left vertebral dominant. Left vertebral artery patent to the basilar without stenosis. Small right vertebral artery which appears to be highly stenotic or possibly occluded at the skull base. Skeleton: Cervical spondylosis.  No acute skeletal abnormality. Other neck: Negative for mass or adenopathy in the neck. Upper chest: Lung apices clear bilaterally. Review of the MIP images confirms the above findings CTA HEAD FINDINGS Anterior circulation: Atherosclerotic calcification throughout the cavernous carotid bilaterally. Moderate stenosis bilaterally. Anterior and middle cerebral arteries patent without occlusion. Atherosclerotic diffuse narrowing and M1 and M2 segments bilaterally. Severe stenosis right M2 branch supplying the right parietal lobe. Posterior circulation: Left vertebral artery supplies the basilar with mild stenosis at the skull base. Small right vertebral artery probably occluded distally although could have a tiny branch supplying the basilar. Basilar patent with mild atherosclerotic stenosis. Superior cerebellar arteries are diseased but without significant stenosis. Venous sinuses: Lack of significant venous contrast due to arterial phase scanning Anatomic variants: Enlarged sella with pituitary lesion measuring 13 mm. Review of the MIP images confirms the above findings CT Brain Perfusion Findings: ASPECTS: 10 CBF (<30%) Volume: 30mL Perfusion (Tmax>6.0s) volume: 123mL Mismatch Volume: 160mL Infarction Location:Multiple foci of delayed perfusion. The largest areas in the right parietal lobe. Other areas include the right frontal lobe, and cerebellum bilaterally. IMPRESSION: 1. CT perfusion negative for core infarct. 104 mL of delayed perfusion. Multiple areas of delayed perfusion or seen most prominently in the right parietal lobe. Other areas could be ischemic or artifactual. 2. No significant carotid stenosis in the neck. 3. Left vertebral  artery dominant. Severe stenosis or occlusion distal right vertebral artery. 4. Diffuse intracranial atherosclerotic disease without large vessel occlusion. Severe stenosis of the right M2 segment supplying the right parietal lobe. 5. These results were called by telephone at the time of interpretation on 09/02/2019 at 2:35 pm to provider [facility] , who verbally acknowledged these results. Electronically Signed   By: Franchot Gallo M.D.   On: 09/02/2019 14:35   CT CEREBRAL PERFUSION W  CONTRAST  Result Date: 09/02/2019 CLINICAL DATA:  Stroke.  Last seen normal 1200 hours. EXAM: CT ANGIOGRAPHY HEAD AND NECK CT PERFUSION BRAIN TECHNIQUE: Multidetector CT imaging of the head and neck was performed using the standard protocol during bolus administration of intravenous contrast. Multiplanar CT image reconstructions and MIPs were obtained to evaluate the vascular anatomy. Carotid stenosis measurements (when applicable) are obtained utilizing NASCET criteria, using the distal internal carotid diameter as the denominator. Multiphase CT imaging of the brain was performed following IV bolus contrast injection. Subsequent parametric perfusion maps were calculated using RAPID software. CONTRAST:  140mL OMNIPAQUE IOHEXOL 350 MG/ML SOLN COMPARISON:  CT head 09/02/2019 FINDINGS: CTA NECK FINDINGS Aortic arch: Standard branching. Imaged portion shows no evidence of aneurysm or dissection. No significant stenosis of the major arch vessel origins. Right carotid system: Right carotid widely patent without significant stenosis. Minimal atherosclerotic disease at the carotid bifurcation Left carotid system: Left carotid widely patent. Negative for atherosclerotic disease or stenosis. Vertebral arteries: Left vertebral dominant. Left vertebral artery patent to the basilar without stenosis. Small right vertebral artery which appears to be highly stenotic or possibly occluded at the skull base. Skeleton: Cervical spondylosis.  No  acute skeletal abnormality. Other neck: Negative for mass or adenopathy in the neck. Upper chest: Lung apices clear bilaterally. Review of the MIP images confirms the above findings CTA HEAD FINDINGS Anterior circulation: Atherosclerotic calcification throughout the cavernous carotid bilaterally. Moderate stenosis bilaterally. Anterior and middle cerebral arteries patent without occlusion. Atherosclerotic diffuse narrowing and M1 and M2 segments bilaterally. Severe stenosis right M2 branch supplying the right parietal lobe. Posterior circulation: Left vertebral artery supplies the basilar with mild stenosis at the skull base. Small right vertebral artery probably occluded distally although could have a tiny branch supplying the basilar. Basilar patent with mild atherosclerotic stenosis. Superior cerebellar arteries are diseased but without significant stenosis. Venous sinuses: Lack of significant venous contrast due to arterial phase scanning Anatomic variants: Enlarged sella with pituitary lesion measuring 13 mm. Review of the MIP images confirms the above findings CT Brain Perfusion Findings: ASPECTS: 10 CBF (<30%) Volume: 69mL Perfusion (Tmax>6.0s) volume: 131mL Mismatch Volume: 123mL Infarction Location:Multiple foci of delayed perfusion. The largest areas in the right parietal lobe. Other areas include the right frontal lobe, and cerebellum bilaterally. IMPRESSION: 1. CT perfusion negative for core infarct. 104 mL of delayed perfusion. Multiple areas of delayed perfusion or seen most prominently in the right parietal lobe. Other areas could be ischemic or artifactual. 2. No significant carotid stenosis in the neck. 3. Left vertebral artery dominant. Severe stenosis or occlusion distal right vertebral artery. 4. Diffuse intracranial atherosclerotic disease without large vessel occlusion. Severe stenosis of the right M2 segment supplying the right parietal lobe. 5. These results were called by telephone at the  time of interpretation on 09/02/2019 at 2:35 pm to provider [facility] , who verbally acknowledged these results. Electronically Signed   By: Franchot Gallo M.D.   On: 09/02/2019 14:35   DG Chest Port 1 View  Result Date: 09/02/2019 CLINICAL DATA:  Altered mental status and left-sided facial droop EXAM: PORTABLE CHEST 1 VIEW COMPARISON:  03/10/2019 FINDINGS: Cardiac shadow is stable. Aortic calcifications are again seen. Elevation of the left hemidiaphragm is again noted and stable. No focal infiltrate or sizable effusion is seen. No bony abnormality is noted. IMPRESSION: No acute abnormality noted. Electronically Signed   By: Inez Catalina M.D.   On: 09/02/2019 15:28   CT HEAD CODE STROKE WO CONTRAST  Result Date: 09/02/2019 CLINICAL DATA:  Code stroke. 85 year old male last seen normal 1200 hours. EXAM: CT HEAD WITHOUT CONTRAST TECHNIQUE: Contiguous axial images were obtained from the base of the skull through the vertex without intravenous contrast. COMPARISON:  Head CT 03/10/2019. Brain MRI 05/06/2018. FINDINGS: Brain: No acute intracranial hemorrhage identified. No midline shift, mass effect, or evidence of intracranial mass lesion. No ventriculomegaly. Widespread and confluent hypodensity in the bilateral cerebral white matter appears stable since June. Stable chronic lacunar infarct in the right thalamus. No cortically based acute infarct identified. Rounded 13 millimeter intra sellar pituitary mass appears stable since 2019. Vascular: Calcified atherosclerosis at the skull base. No suspicious intracranial vascular hyperdensity. Skull: No acute osseous abnormality identified. Sinuses/Orbits: Visualized paranasal sinuses and mastoids are stable and well pneumatized. Other: No acute orbit or scalp soft tissue finding. ASPECTS [facility] Stroke Program Early CT Score) - Ganglionic level infarction (caudate, lentiform nuclei, internal capsule, insula, M1-M3 cortex): - Supraganglionic infarction (M4-M6  cortex): Total score (0-10 with 10 being normal): IMPRESSION: 1. Stable non contrast CT appearance of advanced chronic small vessel disease since June. No acute intracranial hemorrhage. ASPECTS 10. 2. 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 3. These results were communicated to Dr. Erlinda Hong at [place] 12/13/2020by text page via the [facility] messaging system. Electronically Signed   By: Genevie Ann M.D.   On: 09/02/2019 13:57    Procedures Procedures (including critical care time)  Medications Ordered in ED Medications  sodium chloride flush (NS) 0.9 % injection 3 mL (3 mLs Intravenous Given 09/02/19 1418)  iohexol (OMNIPAQUE) 350 MG/ML injection 100 mL (100 mLs Intravenous Contrast Given 09/02/19 1358)    ED Course  I have reviewed the triage vital signs and the nursing notes.  Pertinent labs & imaging results that were available during my care of the patient were reviewed by me and considered in my medical decision making (see chart for details).    MDM Rules/Calculators/A&P     CHA2DS2/VAS Stroke Risk Points      N/A >= 2 Points: High Risk  1 - 1.99 Points: Medium Risk  0 Points: Low Risk    A final score could not be computed because of missing components.: Last  Change: N/A     This score determines the patient's risk of having a stroke if the  patient has atrial fibrillation.      This score is not applicable to this patient. Components are not  calculated.                  Discussed with neurology.  Patient is not a candidate for TPA at this time.  Recommend further inpatient work-up.  Family are aware.  Discussed with internal medicine teaching service.  Will admit patient.  Patient's daughter who is power of attorney does not want the patient to be admitted to the hospital.  Has been seen by internal medicine service and they will arrange for close outpatient follow-up.  I think this is prudent given patient's severe dementia as baseline. Final Clinical Impression(s) / ED  Diagnoses Final diagnoses:  Facial weakness    Rx / DC Orders ED Discharge Orders    None       Julianne Rice, MD 09/02/19 1601    Julianne Rice, MD 09/02/19 1630

## 2019-09-02 NOTE — ED Notes (Signed)
Patient verbalizes understanding of discharge instructions. Opportunity for questioning and answers were provided. Armband removed by staff, pt discharged from ED. Pt. ambulatory and discharged home.  

## 2019-09-02 NOTE — ED Notes (Signed)
Activated code stroke with kimberly from [place]

## 2019-09-04 ENCOUNTER — Encounter: Payer: Self-pay | Admitting: Internal Medicine

## 2019-09-04 ENCOUNTER — Other Ambulatory Visit: Payer: Medicare HMO

## 2019-09-04 ENCOUNTER — Other Ambulatory Visit: Payer: Self-pay

## 2019-09-04 ENCOUNTER — Ambulatory Visit (INDEPENDENT_AMBULATORY_CARE_PROVIDER_SITE_OTHER): Payer: Medicare HMO | Admitting: Internal Medicine

## 2019-09-04 ENCOUNTER — Other Ambulatory Visit: Payer: Self-pay | Admitting: *Deleted

## 2019-09-04 ENCOUNTER — Other Ambulatory Visit: Payer: Self-pay | Admitting: Neurology

## 2019-09-04 ENCOUNTER — Ambulatory Visit: Payer: Medicare HMO

## 2019-09-04 DIAGNOSIS — Z7901 Long term (current) use of anticoagulants: Secondary | ICD-10-CM

## 2019-09-04 DIAGNOSIS — F0280 Dementia in other diseases classified elsewhere without behavioral disturbance: Secondary | ICD-10-CM | POA: Diagnosis not present

## 2019-09-04 DIAGNOSIS — Z8673 Personal history of transient ischemic attack (TIA), and cerebral infarction without residual deficits: Secondary | ICD-10-CM

## 2019-09-04 DIAGNOSIS — G459 Transient cerebral ischemic attack, unspecified: Secondary | ICD-10-CM | POA: Insufficient documentation

## 2019-09-04 DIAGNOSIS — Z86711 Personal history of pulmonary embolism: Secondary | ICD-10-CM | POA: Diagnosis not present

## 2019-09-04 DIAGNOSIS — E785 Hyperlipidemia, unspecified: Secondary | ICD-10-CM

## 2019-09-04 DIAGNOSIS — R7303 Prediabetes: Secondary | ICD-10-CM

## 2019-09-04 DIAGNOSIS — I10 Essential (primary) hypertension: Secondary | ICD-10-CM

## 2019-09-04 DIAGNOSIS — E876 Hypokalemia: Secondary | ICD-10-CM

## 2019-09-04 DIAGNOSIS — G3183 Dementia with Lewy bodies: Secondary | ICD-10-CM

## 2019-09-04 NOTE — Progress Notes (Signed)
  [facility] Internal Medicine Residency Telephone Encounter Continuity Care Appointment  HPI:   This telephone encounter was created for [name] on 09/04/2019 for the following purpose/cc follow-up recent ED visit.  Please see problem based charting for further details   Past Medical History:  Past Medical History:  Diagnosis Date  . Anxiety   . Coronary artery disease, non-occlusive    with history of MI; Cath 2008 w multivessel nonobstructive CAD  . Dementia ([place])   . Depression   . GERD (gastroesophageal reflux disease)   . Hyperlipidemia   . Hypertension   . Memory loss   . Parkinson disease ([place])   . PE (pulmonary embolism)    unprovoked PE completed 6 months of warfarin, warfarin d/ced 02/12/2010, repeat PE 07/10/2010 after a long car ride and now on lifelong coumadin.  . Pituitary microadenoma ([place])    incidental finding CT 12/2009  . Prostate cancer ([place])   . Scoliosis   . Sigmoid volvulus ([place]) 08/02/2016  . Stroke ([place]) 03/2018  . Volvulus of colon ([place]) 06/01/2017      ROS:  Review of Systems  Constitutional: Negative for chills and fever.  Respiratory: Negative for cough and shortness of breath.   Neurological: Weakness: Due to underlying baseline dementia.     Assssment / Plan / Recommendations:   Please see A&P under problem oriented charting for assessment of the patient's acute and chronic medical conditions.   As always, pt is advised that if symptoms worsen or new symptoms arise, they should go to an urgent care facility or to to ER for further evaluation.   Consent and Medical Decision Making:   Patient discussed with Dr. Philipp Ovens  This is a telephone encounter between [name] [name] and Jean Rosenthal on 09/04/2019 for follow-up from recent ED visit. The visit was conducted with the patient located at home and Jean Rosenthal at [facility]. The patient's identity was confirmed using their DOB and current address. The his/her legal guardian has  consented to being evaluated through a telephone encounter and understands the associated risks (an examination cannot be done and the patient may need to come in for an appointment) / benefits (allows the patient to remain at home, decreasing exposure to coronavirus). I personally spent 13 minutes on medical discussion.

## 2019-09-04 NOTE — Progress Notes (Signed)
Internal Medicine Clinic Attending  Case discussed with Dr. Eileen Stanford at the time of the visit.  We reviewed the resident's history and exam and pertinent patient test results.  I agree with the assessment, diagnosis, and plan of care documented in the resident's note.   This telehealth visit today is for ED follow up after presenting with symptoms of left sided facial droop and drooling. Stroke work up was initiated. CTA head and neck showed severe stenosis of the right M2 segment. Neurology recommended MRI and admission for risk factor modification, but his symptoms resolved and given his advanced age and co morbidities, daughter declined further work up. Overall episode likely represented a TIA, although CVA not ruled out without MRI. At this point, I am not sure there is much else we can offer to medically optimize patient for secondary stroke prevention. He is on warfarin for hx of VTE and thus not on aspirin. He has a history of statin intolerance with elevated CK and AST back in March on this year and was taken off lipitor per his PCP. Unclear if he would be a candidate to try other statin therapies. He has a history of HTN but has been relatively well controlled. No episodes of hypotension that I can see. With his intracranial stenosis and advanced age, stricter BP control could be harmful and cause hypoperfusion. At this point he would likely benefit from an in person appointment to repeat labs, vitals, and discuss risks and benefits of statin therapy with his PCP.

## 2019-09-04 NOTE — Assessment & Plan Note (Addendum)
TIA: I had the pleasure of speaking to Mr. Apt's daughter Lilli Light) regarding his care.  He is an 85 year old African-American gentleman with medical history significant for dementia, hyperlipidemia, hypertension, prediabetes, unprovoked pulmonary embolism on chronic anticoagulation, prior history of hemorrhagic stroke in 2019 who recently presented to the emergency department on December 13 with left-sided facial droop and drooling.  Stroke work-up included CT head without contrast which did not reveal any acute intracranial hemorrhage.  CT angiography head, neck and cerebral perfusion revealed severe stenosis or occlusion in the distal right vertebral artery, severe stenosis of the right M2 segment.  Neurology and internal medicine were consulted for further inpatient work-up however the daughter tells me that his symptoms had returned to baseline.  Her daughter had elected not to perform any other diagnostic exams and had elected take his father home and have close outpatient follow-up.  Since his discharge from the ED, his purulent has stopped and due to his cognitive function, he is not able to communicate effectively.  He was previously Lipitor however this was discontinued by his PCP due to elevated CK and AST back in March of this year.  On review of his blood pressure log, BP has been in the 130s-180s/80s-110s  Assessment: 85 year old gentleman with parkinsonian syndrome, hypertension, prior hemorrhagic stroke, prior appropriately on chronic anticoagulation with warfarin, hyperlipidemia with recent presentation to the ED.  Symptoms consistent with TIA.  Given the complexity of his medical condition, he would likely require an in person follow-up with his PCP or the acute care clinic  Plan: -Daughter advised to check ambulatory blood pressures -Will suggest obtaining hemoglobin A1c, lipid panel to help [name] assess optimizing medical therapy  -Follow up with PCP or [facility]

## 2019-09-05 MED ORDER — POTASSIUM CHLORIDE CRYS ER 20 MEQ PO TBCR: 40.0000 meq | EXTENDED_RELEASE_TABLET | Freq: Every day | ORAL | 1 refills | Status: AC

## 2019-09-06 ENCOUNTER — Other Ambulatory Visit: Payer: Medicare HMO | Admitting: Internal Medicine

## 2019-09-06 ENCOUNTER — Other Ambulatory Visit: Payer: Self-pay

## 2019-09-07 ENCOUNTER — Other Ambulatory Visit: Payer: Self-pay

## 2019-09-11 ENCOUNTER — Telehealth: Payer: Self-pay | Admitting: *Deleted

## 2019-09-11 NOTE — Telephone Encounter (Signed)
Received fax PT/INR self-testing summary report:  INR  2.8 on 09/10/19 Send to Dr Elie Confer.

## 2019-09-12 NOTE — Telephone Encounter (Signed)
Seen. Thank you!

## 2019-09-17 ENCOUNTER — Other Ambulatory Visit: Payer: Self-pay

## 2019-09-17 ENCOUNTER — Ambulatory Visit (INDEPENDENT_AMBULATORY_CARE_PROVIDER_SITE_OTHER): Payer: Medicare HMO | Admitting: Internal Medicine

## 2019-09-17 VITALS — BP 168/110 | HR 54 | Temp 97.5°F | Ht 68.0 in | Wt 145.0 lb

## 2019-09-17 DIAGNOSIS — Z79899 Other long term (current) drug therapy: Secondary | ICD-10-CM

## 2019-09-17 DIAGNOSIS — E7800 Pure hypercholesterolemia, unspecified: Secondary | ICD-10-CM

## 2019-09-17 DIAGNOSIS — I10 Essential (primary) hypertension: Secondary | ICD-10-CM

## 2019-09-17 DIAGNOSIS — Z8673 Personal history of transient ischemic attack (TIA), and cerebral infarction without residual deficits: Secondary | ICD-10-CM | POA: Diagnosis not present

## 2019-09-17 DIAGNOSIS — I6789 Other cerebrovascular disease: Secondary | ICD-10-CM

## 2019-09-17 DIAGNOSIS — G20 Parkinson's disease: Secondary | ICD-10-CM

## 2019-09-17 DIAGNOSIS — Z7189 Other specified counseling: Secondary | ICD-10-CM

## 2019-09-17 DIAGNOSIS — L89309 Pressure ulcer of unspecified buttock, unspecified stage: Secondary | ICD-10-CM | POA: Diagnosis not present

## 2019-09-17 MED ORDER — LISINOPRIL 5 MG PO TABS: 5.0000 mg | ORAL_TABLET | Freq: Every day | ORAL | 11 refills | Status: AC

## 2019-09-17 NOTE — Patient Instructions (Addendum)
FOLLOW-UP INSTRUCTIONS When: In four weeks for a repeat lab called a BMP What to bring: All of your medications  Today we stopped your amlodipine and started lisinopril 5 mg daily given the lower risk of interaction of this medication class with your Parkinson's disease medications. We have also obtain basic lab work and a cholesterol lab.  We will notify you of the results of these labs and any potential medical therapy changes might be indicated as a result.  Thank you for your visit to the [facility] today. If you have any questions or concerns please call us at [phone].

## 2019-09-17 NOTE — Progress Notes (Signed)
   CC: concern for bed sores   HPI:[name] is a 85 y.o. male who presents for evaluation of bed sores. Please see individual problem based A/P for details.  Past Medical History:  Diagnosis Date  . Anxiety   . Coronary artery disease, non-occlusive    with history of MI; Cath 2008 w multivessel nonobstructive CAD  . Dementia ([place])   . Depression   . GERD (gastroesophageal reflux disease)   . Hyperlipidemia   . Hypertension   . Memory loss   . Parkinson disease ([place])   . PE (pulmonary embolism)    unprovoked PE completed 6 months of warfarin, warfarin d/ced 02/12/2010, repeat PE 07/10/2010 after a long car ride and now on lifelong coumadin.  . Pituitary microadenoma ([place])    incidental finding CT 12/2009  . Prostate cancer ([place])   . Scoliosis   . Sigmoid volvulus ([place]) 08/02/2016  . Stroke ([place]) 03/2018  . Volvulus of colon ([place]) 06/01/2017   Review of Systems:  ROS negative except as per HPI.  Physical Exam: Vitals:   09/17/19 1355  BP: (!) 168/110  Pulse: (!) 54  Temp: (!) 97.5 F (36.4 C)  TempSrc: Oral  SpO2: 98%  Weight: 145 lb (65.8 kg)  Height: 5\' 8"  (1.727 m)   General: A/O x4, in no acute distress, afebrile, nondiaphoretic Cardio: RRR, no mrg's  Pulmonary: CTA bilaterally,  no wheezing or crackles  MSK: BLE nontender, nonedematous Psych: Appropriate affect, not depressed in appearance, engages well  Assessment & Plan:   See Encounters Tab for problem based charting.  Patient discussed with Dr. Evette Doffing

## 2019-09-18 ENCOUNTER — Encounter: Payer: Self-pay | Admitting: Internal Medicine

## 2019-09-18 DIAGNOSIS — Z7189 Other specified counseling: Secondary | ICD-10-CM | POA: Insufficient documentation

## 2019-09-18 LAB — BMP8+ANION GAP
Anion Gap: 19 mmol/L — ABNORMAL HIGH (ref 10.0–18.0)
BUN/Creatinine Ratio: 16 (ref 10–24)
BUN: 10 mg/dL (ref 8–27)
CO2: 22 mmol/L (ref 20–29)
Calcium: 9.8 mg/dL (ref 8.6–10.2)
Chloride: 99 mmol/L (ref 96–106)
Creatinine, Ser: 0.63 mg/dL — ABNORMAL LOW (ref 0.76–1.27)
GFR calc Af Amer: 103 mL/min/{1.73_m2} (ref >59)
GFR calc non Af Amer: 89 mL/min/{1.73_m2} (ref >59)
Glucose: 72 mg/dL (ref 65–99)
Potassium: 4.4 mmol/L (ref 3.5–5.2)
Sodium: 140 mmol/L (ref 134–144)

## 2019-09-18 LAB — LIPID PANEL
Chol/HDL Ratio: 3.8 ratio (ref 0.0–5.0)
Cholesterol, Total: 201 mg/dL — ABNORMAL HIGH (ref 100–199)
HDL: 53 mg/dL (ref >39)
LDL Chol Calc (NIH): 110 mg/dL — ABNORMAL HIGH (ref 0–99)
Triglycerides: 220 mg/dL — ABNORMAL HIGH (ref 0–149)
VLDL Cholesterol Cal: 38 mg/dL (ref 5–40)

## 2019-09-18 NOTE — Progress Notes (Signed)
Internal Medicine Clinic Attending  Case discussed with Dr. Harbrecht at the time of the visit.  We reviewed the resident's history and exam and pertinent patient test results.  I agree with the assessment, diagnosis, and plan of care documented in the resident's note.   

## 2019-09-18 NOTE — Assessment & Plan Note (Signed)
Hypertension: Patient's BP today is 168/110 with a goal of <~150/90. The patient's daughter endorses adherence to the patient's medication regimen.  She denied that he had endorsed, chest pain, headache, visual changes, lightheadedness, weakness, dizziness on standing, swelling in the feet or ankles although it is nearly impossible for him to communicate these symptoms due to speech and physical deficits from his Parkinsons disease process and his CVA. The patient is markedly hypertensive today at 168/110.  Patients with Parkinson's often have labile blood pressures with wide variations including supine hypotension. It is typically advisable to permit slightly higher blood pressure than normal to balance this effect.  Given that the expert recommendations indicate a preference for an ACE/ARB over CCB's due to decreased interaction with the patient's Parkinson treatments, we have agreed to discontinue amlodipine today and to start a low-dose lisinopril at 5 mg daily.  Plan: Discontinue amlodipine Start lisinopril 5 mg daily BMP today BMP and BP check in 1 month

## 2019-09-18 NOTE — Assessment & Plan Note (Signed)
The patient's daughter and myself had a 15-minute conversation concerning the patient's goals of care today.  She insists today she would like for him to remain full scope of care and desires him to be full code.  She stated that she would wish for all to be done and for her to be contacted for any major events if he were to be hospitalized.  She stated that she recently lost her mother and simply cannot live without her father at this time.  (The patient himself is unable to engage in the conversation. His eyes wonder across the room as we speak.)  I discussed my role as a resident in the hospital and my familiarity with CODE BLUE situations.  I then advised her that given her father's comorbid conditions, survivability of a code was low in his condition and that likely more harm than good would occur.  Additionally, I advised her that it was most efficacious to determine goals of care prior to such events due to the nature of these events.  She admitted that she is aware of the fact that the patient's chest may be crushed, ribs may be fractured, he may need to be intubated and require electrical shocks to restore his heart rhythm.  She wishes that everything be done in such an event and for her to be notified to make a determination at that time to discontinue care.  Furthermore, she is requesting several labs today and full treatment. She is aware that the patients conditions put him at increased risk of death within the next year. I feel the patient's caregiver understands the prognosis.  I also feel that the harm of basic labs is minimal.  We will proceed with treatment at this time per her wishes.

## 2019-09-18 NOTE — Assessment & Plan Note (Signed)
Immobility with pressure sores on buttocks: The patient daughter was concerned for pressure sores on the patients buttocks. On evaluation there did not appear to be pressure sores, the area was well prepared with no appreciable signs of skin breakdown.   Plan: Continue current care

## 2019-09-18 NOTE — Assessment & Plan Note (Deleted)
Hypertension: Patient's BP today is 168/110 with a goal of <~150/90. The patient's daughter endorses adherence to the patient's medication regimen.  She denied that he had endorsed, chest pain, headache, visual changes, lightheadedness, weakness, dizziness on standing, swelling in the feet or ankles although it is nearly impossible for him to communicate these symptoms due to speech and physical deficits from his Parkinsons disease process and his CVA. The patient is markedly hypertensive today at 168/110.  Patients with Parkinson's often have labile blood pressures with wide variations including supine hypotension. It is typically advisable to permit slightly higher blood pressure than normal to balance this effect.  Given that the expert recommendations indicate a preference for an ACE/ARB over CCB's due to decreased interaction with the patient's Parkinson treatments, we have agreed to discontinue amlodipine today and to start a low-dose lisinopril at 5 mg daily.  Plan: Discontinue amlodipine Start lisinopril 5 mg daily BMP today BMP and BP check in 1 month

## 2019-09-18 NOTE — Assessment & Plan Note (Signed)
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  Given the patient's stroke history and cholesterol today with the total of 201 and LDL of 210, HDL 53. A LDL lowering medication would normally be advised in the situation.  However, in the setting of the patient's other comorbid conditions the benefit of adding cholesterol-lowering medication likely does not outweigh the risks.  This was discussed with the patient's daughter who is insistent that the patient be treated for his hypercholesterolemia.  We discussed that we would send this information to the patient's PCP prior to making any medication changes to which they are in agreement.  Plan:  Consider as a reminder versus statin, will reach out to PCP prior to initiation of either

## 2019-10-01 ENCOUNTER — Telehealth: Payer: Self-pay | Admitting: Internal Medicine

## 2019-10-01 NOTE — Telephone Encounter (Signed)
Return call to pt's daughter, [name] - stated when pt was seen  December 28, his BP med was changed to Lisinopril 5 mg which the doctor stated will work better w/Parkinson's. But his BP's were low 100/60 and 100/79. So she decided to give pt 2.5 mg instead and checking BP prior to giving - BP running 120/80's. And giving it in the afternoon (instead of morning) when BP seems to be higher. Wants to ask the doctor if this is ok or not? Thanks

## 2019-10-01 NOTE — Telephone Encounter (Signed)
Yes, I agree with that change to 2.5mg  and fine to give in the afternoon

## 2019-10-01 NOTE — Telephone Encounter (Signed)
Called pt's daughter, [name] - informed of Dr Jodene Nam response "I agree with that change to 2.5mg  and fine to give in the afternoon".

## 2019-10-01 NOTE — Telephone Encounter (Signed)
Pls contact regarding medicine [phone]

## 2019-10-09 ENCOUNTER — Telehealth: Payer: Self-pay | Admitting: Internal Medicine

## 2019-10-09 NOTE — Telephone Encounter (Signed)
Pt's daughter following up with an order for a [employer] for the patient.  Pt daughter also asking to renew the patient DMV Placard as well.  Please call pt's daughter back.

## 2019-10-12 NOTE — Telephone Encounter (Signed)
Pt daughter is calling back, pls contact

## 2019-10-12 NOTE — Telephone Encounter (Signed)
Returned call to patient's daughter. No answer. Left message on VM requesting return call. Hubbard Hartshorn, RN, BSN

## 2019-10-12 NOTE — Telephone Encounter (Signed)
Returned call to daughter. Patient has appt on 10/18/2019 at 3:15. Need for hoyer lift and DMV placard (for daughter when she drives patient) can be discussed at that time. Daughter is in agreement. She will call back if she needs to reschedule as she has a class on that day. Hubbard Hartshorn, BSN, RN-BC

## 2019-10-16 ENCOUNTER — Telehealth: Payer: Self-pay | Admitting: Pharmacist

## 2019-10-16 DIAGNOSIS — Z7902 Long term (current) use of antithrombotics/antiplatelets: Secondary | ICD-10-CM

## 2019-10-16 DIAGNOSIS — Z86711 Personal history of pulmonary embolism: Secondary | ICD-10-CM

## 2019-10-16 MED ORDER — WARFARIN SODIUM 5 MG PO TABS: ORAL_TABLET | ORAL | 1 refills | Status: AC

## 2019-10-16 NOTE — Telephone Encounter (Signed)
Patient's daughter called with PST FS POC INR results 2.5 (target goal 2.0 - 3.0) on 30mg  warfarin/wk. No bleeding. No new medications except BP medicine change (to linisopril). Continue same regimen: 5mg  QD except 2 days of the week, (Th/Sat), 2.5mg .Requests ref[facility] to [facility], [address], [place].

## 2019-10-18 ENCOUNTER — Ambulatory Visit (INDEPENDENT_AMBULATORY_CARE_PROVIDER_SITE_OTHER): Payer: Medicare HMO | Admitting: Internal Medicine

## 2019-10-18 ENCOUNTER — Other Ambulatory Visit: Payer: Self-pay

## 2019-10-18 VITALS — BP 184/130 | HR 70 | Temp 97.8°F | Ht 68.0 in

## 2019-10-18 DIAGNOSIS — I10 Essential (primary) hypertension: Secondary | ICD-10-CM

## 2019-10-18 DIAGNOSIS — R238 Other skin changes: Secondary | ICD-10-CM | POA: Diagnosis not present

## 2019-10-18 DIAGNOSIS — Z993 Dependence on wheelchair: Secondary | ICD-10-CM | POA: Diagnosis not present

## 2019-10-18 DIAGNOSIS — G214 Vascular parkinsonism: Secondary | ICD-10-CM

## 2019-10-18 DIAGNOSIS — Z79899 Other long term (current) drug therapy: Secondary | ICD-10-CM | POA: Diagnosis not present

## 2019-10-18 DIAGNOSIS — Z23 Encounter for immunization: Secondary | ICD-10-CM

## 2019-10-18 DIAGNOSIS — L259 Unspecified contact dermatitis, unspecified cause: Secondary | ICD-10-CM

## 2019-10-18 DIAGNOSIS — G219 Secondary parkinsonism, unspecified: Secondary | ICD-10-CM

## 2019-10-18 MED ORDER — TRIAMCINOLONE ACETONIDE 0.5 % EX CREA: 1.0000 "application " | TOPICAL_CREAM | Freq: Three times a day (TID) | CUTANEOUS | 0 refills | Status: AC

## 2019-10-18 NOTE — Patient Instructions (Signed)
It was a pleasure meeting you both today. I have sent a steroid cream to your pharmacy for the rash.  I have also sent in the order for the hoyer lift. Please let me know if you have any questions.

## 2019-10-19 NOTE — Progress Notes (Signed)
   CC: rash  HPI:  [name] is a 86 y.o. male who presents with her daughter for evaluation of a rash as well as for a hoyer and handicap parking permit. Please see problem based assessment and plan for additional details.     Past Medical History:  Diagnosis Date  . Anxiety   . Coronary artery disease, non-occlusive    with history of MI; Cath 2008 w multivessel nonobstructive CAD  . Dementia ([place])   . Depression   . GERD (gastroesophageal reflux disease)   . Hyperlipidemia   . Hypertension   . Memory loss   . Parkinson disease ([place])   . PE (pulmonary embolism)    unprovoked PE completed 6 months of warfarin, warfarin d/ced 02/12/2010, repeat PE 07/10/2010 after a long car ride and now on lifelong coumadin.  . Pituitary microadenoma ([place])    incidental finding CT 12/2009  . Prostate cancer ([place])   . Scoliosis   . Sigmoid volvulus ([place]) 08/02/2016  . Stroke ([place]) 03/2018  . Volvulus of colon ([place]) 06/01/2017    Review of Systems:  Review of Systems - History obtained from unobtainable from patient due to mental status   Physical Exam:  Vitals:   10/18/19 1529  BP: (!) 184/130  Pulse: 70  Temp: 97.8 F (36.6 C)  TempSrc: Oral  SpO2: 97%  Height: 5\' 8"  (1.727 m)    GENERAL: chronically ill appearing male in wheelchair CARDIAC: heart regular rate and rhythm MSK: wheelchair bound--non-ambulatory. Cogwheel rigidity throughout. SKIN: vesicular rash involving his right lateral and antecubital area. Some surrounding erythema, no drainage. Does not slough with light touch and although he is non-verbal, he does not appear to be in pain with touching it.      Assessment & Plan:   Vesicular rash. 1d history of this. No inciting events. No changes in lotions, medications or known environmental exposures. Patient is nonverbal but does not appear he has pain with touching the rash. Afebrile.  Assessment: likely contact dermatitis however unclear what it is from.  Shingles considered however does not have the typical appearance and seems non painful. If it worsens or does not improve with the steroid cream, he will need to come back and shingles would be higher on my list. Plan: kenalog cream sent to pharmacy. Discussed with his caregiver (granddaughter) to watch it closely and to bring him back in if it worsens or does not improve.  Vascular Parkinson's Syndrome Patient requires maximum assist with all transfers due to his Parkinson's Disease. DME order for hoyer lift placed.   Hypertension. Blood pressure fairly elevated in the clinic today. Granddaughter notes that his blood pressure has been in the Q000111Q systolically at home and declines medication changes. On recheck, blood pressure did come down to the Q000111Q systolically. Discussed importance of bringing him back in for re-evaluation of his hypertension if he does start to have elevated blood pressures at home.   Patient discussed with Dr. Venetia Maxon, MD Internal Medicine Resident-PGY1 10/19/19

## 2019-10-20 DIAGNOSIS — R238 Other skin changes: Secondary | ICD-10-CM | POA: Insufficient documentation

## 2019-10-20 NOTE — Assessment & Plan Note (Signed)
Vesicular rash. 1d history of this. No inciting events. No changes in lotions, medications or known environmental exposures. Patient is nonverbal but does not appear he has pain with touching the rash. Afebrile.  Assessment: likely contact dermatitis however unclear what it is from. Shingles considered however does not have the typical appearance and seems non painful. If it worsens or does not improve with the steroid cream, he will need to come back and shingles would be higher on my list. Plan: kenalog cream sent to pharmacy. Discussed with his caregiver (granddaughter) to watch it closely and to bring him back in if it worsens or does not improve.

## 2019-10-20 NOTE — Assessment & Plan Note (Signed)
Vascular Parkinson's Syndrome Patient requires maximum assist with all transfers due to his Parkinson's Disease. DME order for hoyer lift placed.

## 2019-10-20 NOTE — Assessment & Plan Note (Signed)
Hypertension. Blood pressure fairly elevated in the clinic today. Granddaughter notes that his blood pressure has been in the Q000111Q systolically at home and declines medication changes. On recheck, blood pressure did come down to the Q000111Q systolically. Discussed importance of bringing him back in for re-evaluation of his hypertension if he does start to have elevated blood pressures at home.

## 2019-10-22 NOTE — Progress Notes (Signed)
Internal Medicine Clinic Attending  Case discussed with Dr. Christian at the time of the visit.  We reviewed the resident's history and exam and pertinent patient test results.  I agree with the assessment, diagnosis, and plan of care documented in the resident's note.    

## 2019-10-23 NOTE — Telephone Encounter (Signed)
[name] [name] at [facility] notified via [employer] that order has been placed in [place] for [employer]. F2F was 10/18/2019. Hubbard Hartshorn, BSN, RN-BC

## 2019-10-23 NOTE — Telephone Encounter (Signed)
Jovita Kussmaul, RN; [place], Jeanie Cooks, Delaney Meigs, [place]; [place], [place], [place]   Got it.

## 2019-10-25 ENCOUNTER — Telehealth: Payer: Self-pay | Admitting: Internal Medicine

## 2019-10-25 NOTE — Telephone Encounter (Signed)
The change in color usually is just a reflection of different color food intake.  I think trying a fleet enema again would be a good idea as the fecal impaction is likely to recur.

## 2019-10-25 NOTE — Telephone Encounter (Signed)
RTC to patient and spoke with his daughter, [name]. She states pt has ongoing diarrhea X 2 weeks, which is a "deep olive green in color".  Daughter states pt had a fecal impaction per imaging on 08/14/19 and had same type of diarrhea, except color.  States pt is having approximately 5 BM's / day.  Dr. Heber  suggested fleets enema for previous impaction, which she said helped.  She is wanting to know if this could be another impaction and if she can try giving fleets again.  She states pt has no other symptoms and is eating and drinking normally. Will forward to PCP to advise. SChaplin, RN,BSN

## 2019-10-25 NOTE — Telephone Encounter (Signed)
TC to Pt's daughter, [name].  Informed color change is usually due to different color food intake.  Informed fecal impaction is likely to recur and using fleets enema would be a good idea per Dr. Heber Smithfield.  She verbalized understanding. SChaplin, RN,BSN

## 2019-10-25 NOTE — Telephone Encounter (Signed)
Daughter calling to report dark green diarrhea x 2 weeks.  Daughter concerned and would like a call back .

## 2019-11-04 ENCOUNTER — Other Ambulatory Visit: Payer: Self-pay | Admitting: Neurology

## 2019-11-16 ENCOUNTER — Telehealth: Payer: Self-pay | Admitting: Adult Health

## 2019-11-16 NOTE — Telephone Encounter (Signed)
Pharmacist Shini @[facility] UL:[id number] - has called re: [name],NP's request for Sinemet for pt. She states the request needs to be resubmitted for pt, please call

## 2019-11-19 MED ORDER — CARBIDOPA-LEVODOPA 25-100 MG PO TABS: 1.0000 | ORAL_TABLET | Freq: Four times a day (QID) | ORAL | 3 refills | Status: AC

## 2019-11-19 NOTE — Telephone Encounter (Signed)
Order placed.  Pt has appt 01/2020.  Taking carbidopa/levodopa 25/100 IR 1 qid.

## 2019-11-19 NOTE — Addendum Note (Signed)
Addended by: Brandon Melnick on: 11/19/2019 10:51 AM   Modules accepted: Orders

## 2019-11-20 ENCOUNTER — Encounter: Payer: Self-pay | Admitting: Internal Medicine

## 2019-11-20 ENCOUNTER — Ambulatory Visit (INDEPENDENT_AMBULATORY_CARE_PROVIDER_SITE_OTHER): Payer: Medicare HMO | Admitting: Internal Medicine

## 2019-11-20 VITALS — BP 195/117 | HR 62 | Temp 97.5°F | Ht 68.0 in

## 2019-11-20 DIAGNOSIS — R1310 Dysphagia, unspecified: Secondary | ICD-10-CM | POA: Diagnosis not present

## 2019-11-20 DIAGNOSIS — Z993 Dependence on wheelchair: Secondary | ICD-10-CM

## 2019-11-20 DIAGNOSIS — G20 Parkinson's disease: Secondary | ICD-10-CM | POA: Diagnosis not present

## 2019-11-20 DIAGNOSIS — G459 Transient cerebral ischemic attack, unspecified: Secondary | ICD-10-CM | POA: Diagnosis not present

## 2019-11-20 DIAGNOSIS — Z79899 Other long term (current) drug therapy: Secondary | ICD-10-CM | POA: Diagnosis not present

## 2019-11-20 DIAGNOSIS — G219 Secondary parkinsonism, unspecified: Secondary | ICD-10-CM | POA: Diagnosis not present

## 2019-11-20 DIAGNOSIS — I10 Essential (primary) hypertension: Secondary | ICD-10-CM | POA: Diagnosis not present

## 2019-11-20 DIAGNOSIS — J309 Allergic rhinitis, unspecified: Secondary | ICD-10-CM

## 2019-11-20 DIAGNOSIS — T7840XA Allergy, unspecified, initial encounter: Secondary | ICD-10-CM | POA: Insufficient documentation

## 2019-11-20 DIAGNOSIS — T7840XD Allergy, unspecified, subsequent encounter: Secondary | ICD-10-CM

## 2019-11-20 NOTE — Assessment & Plan Note (Signed)
BP elevated during visit today to 195/117. This is typical for him during visits. His BP at home ranges in the 0000000 to [id number] systolic on his current medications. Due to these home reading and BP lability with parkinson's; will continue current dose. - Lisinopril 5mg  Daily

## 2019-11-20 NOTE — Patient Instructions (Addendum)
Thank you for allowing [name] to care for you  For your ongoing difficulty swallowing - We have ordered a swallowing study to be done here at [place] - You will be contacted with the results  Switch allergy medication to Zyrtec for better effect  Follow up as scheduled

## 2019-11-20 NOTE — Progress Notes (Signed)
   CC: Dysphagia, Hypertension, Allergies  HPI:  [name] is a 86 y.o. M with PMHx listed below presenting for Dysphagia, Hypertension, Allergies. Please see the A&P for the status of the patient's chronic medical problems.  Past Medical History:  Diagnosis Date  . Anxiety   . Coronary artery disease, non-occlusive    with history of MI; Cath 2008 w multivessel nonobstructive CAD  . Dementia ([place])   . Depression   . GERD (gastroesophageal reflux disease)   . Hyperlipidemia   . Hypertension   . Memory loss   . Parkinson disease ([place])   . PE (pulmonary embolism)    unprovoked PE completed 6 months of warfarin, warfarin d/ced 02/12/2010, repeat PE 07/10/2010 after a long car ride and now on lifelong coumadin.  . Pituitary microadenoma ([place])    incidental finding CT 12/2009  . Prostate cancer ([place])   . Scoliosis   . Sigmoid volvulus ([place]) 08/02/2016  . Stroke ([place]) 03/2018  . Volvulus of colon ([place]) 06/01/2017   Review of Systems:  Performed and all others negative.  Physical Exam:  Vitals:   11/20/19 1526  BP: (!) 195/117  Pulse: 62  Temp: (!) 97.5 F (36.4 C)  TempSrc: Axillary  SpO2: 98%  Height: 5\' 8"  (1.727 m)   Physical Exam Constitutional:      Appearance: He is ill-appearing.     Comments: Wheelchair bound  HENT:     Nose: No congestion or rhinorrhea.  Cardiovascular:     Rate and Rhythm: Normal rate and regular rhythm.     Pulses: Normal pulses.     Heart sounds: Normal heart sounds.  Pulmonary:     Effort: Pulmonary effort is normal. No respiratory distress.     Breath sounds: Normal breath sounds.  Abdominal:     General: Bowel sounds are normal. There is no distension.     Palpations: Abdomen is soft.     Tenderness: There is no abdominal tenderness.  Musculoskeletal:        General: No swelling or deformity.  Skin:    General: Skin is warm and dry.     Comments: Small lipomatous mass posterior L shoulder  Neurological:     Comments:  Nonverbal, Wheelchair bound, mask facies Cogwheel rgidity    Assessment & Plan:   See Encounters Tab for problem based charting.  Patient discussed with Dr. Lynnae January

## 2019-11-20 NOTE — Assessment & Plan Note (Addendum)
Daughter has noticed increased red eye and is wondering if he needs an increased dose of Claritin. Recommend a switch to chewable/cruashable zyrtec as this may be more effective for him. - Zyrtec 5mg  Daily

## 2019-11-20 NOTE — Assessment & Plan Note (Addendum)
Patient presents with his daughter who is his caregiver. He is nonverbal. She reports worsening in his dysphagia with some more grimacing than is typically. He is very resistant to having his mouth open, but no drainage nor significant mucous noted in nares (view partially obstructed by nasal hair). Lung sound are clear, do not suspect significant aspiration. Good O2 saturations.  This may be progression of his parkinson;s disease leading to dis-coordinated swallowing. She is agreeable to MBS at this time and this would affect management as she is open to a feeding tube if needed based on prior discussion with PCP and Neurology. Will obtain this study and discuss results with her and her PCP. - MBS

## 2019-11-20 NOTE — Progress Notes (Signed)
Internal Medicine Clinic Attending  Case discussed with Dr. Melvin  at the time of the visit.  We reviewed the resident's history and exam and pertinent patient test results.  I agree with the assessment, diagnosis, and plan of care documented in the resident's note.  

## 2019-11-22 ENCOUNTER — Other Ambulatory Visit (HOSPITAL_COMMUNITY): Payer: Self-pay | Admitting: *Deleted

## 2019-11-22 DIAGNOSIS — R1310 Dysphagia, unspecified: Secondary | ICD-10-CM

## 2019-11-23 ENCOUNTER — Telehealth: Payer: Self-pay | Admitting: Pharmacist

## 2019-11-23 DIAGNOSIS — Z7901 Long term (current) use of anticoagulants: Secondary | ICD-10-CM | POA: Diagnosis not present

## 2019-11-23 DIAGNOSIS — I2699 Other pulmonary embolism without acute cor pulmonale: Secondary | ICD-10-CM | POA: Diagnosis not present

## 2019-11-23 NOTE — Telephone Encounter (Signed)
Texted at 5:49PM 5-MAR-21 by patient's daughter that PST FS POC INR = 4.1 (has already taken today's dose). Instructed to OMIT doses on Saturday and Sunday (6/7-MAR-21) and repeat PST FS POC on Monday BEFORE dose is administered. No symptoms of bleeding. No new medications. Dosage Out: F-5mg Sa-holdSu-holdM-inr

## 2019-11-26 ENCOUNTER — Encounter (HOSPITAL_COMMUNITY): Payer: Self-pay | Admitting: Emergency Medicine

## 2019-11-26 ENCOUNTER — Inpatient Hospital Stay (HOSPITAL_COMMUNITY)
Admission: EM | Admit: 2019-11-26 | Discharge: 2019-12-20 | DRG: 871 | Disposition: E | Payer: Medicare HMO | Attending: Pulmonary Disease | Admitting: Pulmonary Disease

## 2019-11-26 ENCOUNTER — Inpatient Hospital Stay (HOSPITAL_COMMUNITY): Payer: Medicare HMO

## 2019-11-26 ENCOUNTER — Other Ambulatory Visit: Payer: Self-pay

## 2019-11-26 ENCOUNTER — Emergency Department (HOSPITAL_COMMUNITY): Payer: Medicare HMO

## 2019-11-26 DIAGNOSIS — Z789 Other specified health status: Secondary | ICD-10-CM | POA: Diagnosis not present

## 2019-11-26 DIAGNOSIS — G308 Other Alzheimer's disease: Secondary | ICD-10-CM | POA: Diagnosis not present

## 2019-11-26 DIAGNOSIS — Z8673 Personal history of transient ischemic attack (TIA), and cerebral infarction without residual deficits: Secondary | ICD-10-CM

## 2019-11-26 DIAGNOSIS — N390 Urinary tract infection, site not specified: Secondary | ICD-10-CM | POA: Diagnosis present

## 2019-11-26 DIAGNOSIS — J9601 Acute respiratory failure with hypoxia: Secondary | ICD-10-CM | POA: Diagnosis not present

## 2019-11-26 DIAGNOSIS — Z4659 Encounter for fitting and adjustment of other gastrointestinal appliance and device: Secondary | ICD-10-CM | POA: Diagnosis not present

## 2019-11-26 DIAGNOSIS — Z888 Allergy status to other drugs, medicaments and biological substances status: Secondary | ICD-10-CM

## 2019-11-26 DIAGNOSIS — Q245 Malformation of coronary vessels: Secondary | ICD-10-CM | POA: Diagnosis not present

## 2019-11-26 DIAGNOSIS — I5022 Chronic systolic (congestive) heart failure: Secondary | ICD-10-CM | POA: Diagnosis present

## 2019-11-26 DIAGNOSIS — K5900 Constipation, unspecified: Secondary | ICD-10-CM | POA: Diagnosis present

## 2019-11-26 DIAGNOSIS — I5032 Chronic diastolic (congestive) heart failure: Secondary | ICD-10-CM

## 2019-11-26 DIAGNOSIS — E861 Hypovolemia: Secondary | ICD-10-CM | POA: Diagnosis present

## 2019-11-26 DIAGNOSIS — G309 Alzheimer's disease, unspecified: Secondary | ICD-10-CM | POA: Diagnosis present

## 2019-11-26 DIAGNOSIS — R0689 Other abnormalities of breathing: Secondary | ICD-10-CM | POA: Diagnosis not present

## 2019-11-26 DIAGNOSIS — I959 Hypotension, unspecified: Secondary | ICD-10-CM | POA: Diagnosis not present

## 2019-11-26 DIAGNOSIS — Z7901 Long term (current) use of anticoagulants: Secondary | ICD-10-CM

## 2019-11-26 DIAGNOSIS — L89152 Pressure ulcer of sacral region, stage 2: Secondary | ICD-10-CM | POA: Diagnosis not present

## 2019-11-26 DIAGNOSIS — F329 Major depressive disorder, single episode, unspecified: Secondary | ICD-10-CM | POA: Diagnosis present

## 2019-11-26 DIAGNOSIS — D689 Coagulation defect, unspecified: Secondary | ICD-10-CM | POA: Diagnosis present

## 2019-11-26 DIAGNOSIS — F0280 Dementia in other diseases classified elsewhere without behavioral disturbance: Secondary | ICD-10-CM | POA: Diagnosis not present

## 2019-11-26 DIAGNOSIS — R404 Transient alteration of awareness: Secondary | ICD-10-CM | POA: Diagnosis not present

## 2019-11-26 DIAGNOSIS — R140 Abdominal distension (gaseous): Secondary | ICD-10-CM | POA: Diagnosis not present

## 2019-11-26 DIAGNOSIS — Z66 Do not resuscitate: Secondary | ICD-10-CM | POA: Diagnosis not present

## 2019-11-26 DIAGNOSIS — R54 Age-related physical debility: Secondary | ICD-10-CM | POA: Diagnosis present

## 2019-11-26 DIAGNOSIS — R0902 Hypoxemia: Secondary | ICD-10-CM | POA: Diagnosis not present

## 2019-11-26 DIAGNOSIS — E440 Moderate protein-calorie malnutrition: Secondary | ICD-10-CM | POA: Diagnosis not present

## 2019-11-26 DIAGNOSIS — Z515 Encounter for palliative care: Secondary | ICD-10-CM | POA: Diagnosis present

## 2019-11-26 DIAGNOSIS — F419 Anxiety disorder, unspecified: Secondary | ICD-10-CM | POA: Diagnosis present

## 2019-11-26 DIAGNOSIS — R001 Bradycardia, unspecified: Secondary | ICD-10-CM | POA: Diagnosis not present

## 2019-11-26 DIAGNOSIS — Z20822 Contact with and (suspected) exposure to covid-19: Secondary | ICD-10-CM | POA: Diagnosis present

## 2019-11-26 DIAGNOSIS — J90 Pleural effusion, not elsewhere classified: Secondary | ICD-10-CM | POA: Diagnosis not present

## 2019-11-26 DIAGNOSIS — J9811 Atelectasis: Secondary | ICD-10-CM | POA: Diagnosis not present

## 2019-11-26 DIAGNOSIS — I5021 Acute systolic (congestive) heart failure: Secondary | ICD-10-CM | POA: Diagnosis not present

## 2019-11-26 DIAGNOSIS — K5790 Diverticulosis of intestine, part unspecified, without perforation or abscess without bleeding: Secondary | ICD-10-CM | POA: Diagnosis present

## 2019-11-26 DIAGNOSIS — I4510 Unspecified right bundle-branch block: Secondary | ICD-10-CM | POA: Diagnosis present

## 2019-11-26 DIAGNOSIS — Z8546 Personal history of malignant neoplasm of prostate: Secondary | ICD-10-CM

## 2019-11-26 DIAGNOSIS — R6521 Severe sepsis with septic shock: Secondary | ICD-10-CM | POA: Diagnosis not present

## 2019-11-26 DIAGNOSIS — R61 Generalized hyperhidrosis: Secondary | ICD-10-CM | POA: Diagnosis present

## 2019-11-26 DIAGNOSIS — N179 Acute kidney failure, unspecified: Secondary | ICD-10-CM | POA: Diagnosis not present

## 2019-11-26 DIAGNOSIS — E162 Hypoglycemia, unspecified: Secondary | ICD-10-CM | POA: Diagnosis not present

## 2019-11-26 DIAGNOSIS — K219 Gastro-esophageal reflux disease without esophagitis: Secondary | ICD-10-CM | POA: Diagnosis present

## 2019-11-26 DIAGNOSIS — Z9911 Dependence on respirator [ventilator] status: Secondary | ICD-10-CM

## 2019-11-26 DIAGNOSIS — E872 Acidosis: Secondary | ICD-10-CM | POA: Diagnosis not present

## 2019-11-26 DIAGNOSIS — R64 Cachexia: Secondary | ICD-10-CM | POA: Diagnosis present

## 2019-11-26 DIAGNOSIS — Z79899 Other long term (current) drug therapy: Secondary | ICD-10-CM

## 2019-11-26 DIAGNOSIS — I2510 Atherosclerotic heart disease of native coronary artery without angina pectoris: Secondary | ICD-10-CM | POA: Diagnosis present

## 2019-11-26 DIAGNOSIS — D649 Anemia, unspecified: Secondary | ICD-10-CM | POA: Diagnosis present

## 2019-11-26 DIAGNOSIS — K7689 Other specified diseases of liver: Secondary | ICD-10-CM | POA: Diagnosis not present

## 2019-11-26 DIAGNOSIS — Z823 Family history of stroke: Secondary | ICD-10-CM

## 2019-11-26 DIAGNOSIS — R627 Adult failure to thrive: Secondary | ICD-10-CM | POA: Diagnosis present

## 2019-11-26 DIAGNOSIS — Z91013 Allergy to seafood: Secondary | ICD-10-CM

## 2019-11-26 DIAGNOSIS — A419 Sepsis, unspecified organism: Secondary | ICD-10-CM | POA: Diagnosis not present

## 2019-11-26 DIAGNOSIS — I110 Hypertensive heart disease with heart failure: Secondary | ICD-10-CM | POA: Diagnosis present

## 2019-11-26 DIAGNOSIS — J9690 Respiratory failure, unspecified, unspecified whether with hypoxia or hypercapnia: Secondary | ICD-10-CM

## 2019-11-26 DIAGNOSIS — L8990 Pressure ulcer of unspecified site, unspecified stage: Secondary | ICD-10-CM | POA: Insufficient documentation

## 2019-11-26 DIAGNOSIS — Z9981 Dependence on supplemental oxygen: Secondary | ICD-10-CM

## 2019-11-26 DIAGNOSIS — Z8249 Family history of ischemic heart disease and other diseases of the circulatory system: Secondary | ICD-10-CM

## 2019-11-26 DIAGNOSIS — J690 Pneumonitis due to inhalation of food and vomit: Secondary | ICD-10-CM | POA: Diagnosis not present

## 2019-11-26 DIAGNOSIS — G9349 Other encephalopathy: Secondary | ICD-10-CM | POA: Diagnosis present

## 2019-11-26 DIAGNOSIS — E785 Hyperlipidemia, unspecified: Secondary | ICD-10-CM | POA: Diagnosis present

## 2019-11-26 DIAGNOSIS — Z9049 Acquired absence of other specified parts of digestive tract: Secondary | ICD-10-CM

## 2019-11-26 DIAGNOSIS — R579 Shock, unspecified: Secondary | ICD-10-CM | POA: Diagnosis not present

## 2019-11-26 DIAGNOSIS — G20 Parkinson's disease: Secondary | ICD-10-CM | POA: Diagnosis present

## 2019-11-26 DIAGNOSIS — Z86711 Personal history of pulmonary embolism: Secondary | ICD-10-CM

## 2019-11-26 DIAGNOSIS — J9600 Acute respiratory failure, unspecified whether with hypoxia or hypercapnia: Secondary | ICD-10-CM | POA: Diagnosis not present

## 2019-11-26 DIAGNOSIS — I252 Old myocardial infarction: Secondary | ICD-10-CM

## 2019-11-26 DIAGNOSIS — R4182 Altered mental status, unspecified: Secondary | ICD-10-CM | POA: Diagnosis not present

## 2019-11-26 DIAGNOSIS — Z6822 Body mass index (BMI) 22.0-22.9, adult: Secondary | ICD-10-CM

## 2019-11-26 DIAGNOSIS — G9340 Encephalopathy, unspecified: Secondary | ICD-10-CM | POA: Diagnosis not present

## 2019-11-26 DIAGNOSIS — Z801 Family history of malignant neoplasm of trachea, bronchus and lung: Secondary | ICD-10-CM

## 2019-11-26 DIAGNOSIS — R29818 Other symptoms and signs involving the nervous system: Secondary | ICD-10-CM | POA: Diagnosis not present

## 2019-11-26 DIAGNOSIS — Z9079 Acquired absence of other genital organ(s): Secondary | ICD-10-CM

## 2019-11-26 DIAGNOSIS — Z4682 Encounter for fitting and adjustment of non-vascular catheter: Secondary | ICD-10-CM | POA: Diagnosis not present

## 2019-11-26 DIAGNOSIS — R000 Tachycardia, unspecified: Secondary | ICD-10-CM | POA: Diagnosis not present

## 2019-11-26 DIAGNOSIS — R188 Other ascites: Secondary | ICD-10-CM | POA: Diagnosis not present

## 2019-11-26 DIAGNOSIS — M419 Scoliosis, unspecified: Secondary | ICD-10-CM | POA: Diagnosis present

## 2019-11-26 DIAGNOSIS — Z820 Family history of epilepsy and other diseases of the nervous system: Secondary | ICD-10-CM

## 2019-11-26 LAB — CBC WITH DIFFERENTIAL/PLATELET
Abs Immature Granulocytes: 0.03 10*3/uL (ref 0.00–0.07)
Basophils Absolute: 0 10*3/uL (ref 0.0–0.1)
Basophils Relative: 1 %
Eosinophils Absolute: 0.3 10*3/uL (ref 0.0–0.5)
Eosinophils Relative: 3 %
HCT: 41.5 % (ref 39.0–52.0)
Hemoglobin: 12.8 g/dL — ABNORMAL LOW (ref 13.0–17.0)
Immature Granulocytes: 0 %
Lymphocytes Relative: 37 %
Lymphs Abs: 3.3 10*3/uL (ref 0.7–4.0)
MCH: 31.1 pg (ref 26.0–34.0)
MCHC: 30.8 g/dL (ref 30.0–36.0)
MCV: 101 fL — ABNORMAL HIGH (ref 80.0–100.0)
Monocytes Absolute: 0.4 10*3/uL (ref 0.1–1.0)
Monocytes Relative: 4 %
Neutro Abs: 4.8 10*3/uL (ref 1.7–7.7)
Neutrophils Relative %: 55 %
Platelets: 208 10*3/uL (ref 150–400)
RBC: 4.11 MIL/uL — ABNORMAL LOW (ref 4.22–5.81)
RDW: 14.9 % (ref 11.5–15.5)
WBC: 8.7 10*3/uL (ref 4.0–10.5)
nRBC: 0 % (ref 0.0–0.2)

## 2019-11-26 LAB — COMPREHENSIVE METABOLIC PANEL
ALT: 10 U/L (ref 0–44)
AST: 32 U/L (ref 15–41)
Albumin: 3.2 g/dL — ABNORMAL LOW (ref 3.5–5.0)
Alkaline Phosphatase: 42 U/L (ref 38–126)
Anion gap: 11 (ref 5–15)
BUN: 12 mg/dL (ref 8–23)
CO2: 23 mmol/L (ref 22–32)
Calcium: 9.6 mg/dL (ref 8.9–10.3)
Chloride: 108 mmol/L (ref 98–111)
Creatinine, Ser: 0.96 mg/dL (ref 0.61–1.24)
GFR calc Af Amer: >60 mL/min (ref >60)
GFR calc non Af Amer: >60 mL/min (ref >60)
Glucose, Bld: 141 mg/dL — ABNORMAL HIGH (ref 70–99)
Potassium: 3.6 mmol/L (ref 3.5–5.1)
Sodium: 142 mmol/L (ref 135–145)
Total Bilirubin: 1 mg/dL (ref 0.3–1.2)
Total Protein: 6.5 g/dL (ref 6.5–8.1)

## 2019-11-26 LAB — I-STAT CHEM 8, ED
BUN: 13 mg/dL (ref 8–23)
Calcium, Ion: 1.22 mmol/L (ref 1.15–1.40)
Chloride: 107 mmol/L (ref 98–111)
Creatinine, Ser: 0.8 mg/dL (ref 0.61–1.24)
Glucose, Bld: 132 mg/dL — ABNORMAL HIGH (ref 70–99)
HCT: 40 % (ref 39.0–52.0)
Hemoglobin: 13.6 g/dL (ref 13.0–17.0)
Potassium: 3.4 mmol/L — ABNORMAL LOW (ref 3.5–5.1)
Sodium: 140 mmol/L (ref 135–145)
TCO2: 25 mmol/L (ref 22–32)

## 2019-11-26 LAB — URINALYSIS, ROUTINE W REFLEX MICROSCOPIC
Glucose, UA: NEGATIVE mg/dL
Ketones, ur: 5 mg/dL — AB
Nitrite: NEGATIVE
Protein, ur: 100 mg/dL — AB
Specific Gravity, Urine: 1.017 (ref 1.005–1.030)
pH: 5 (ref 5.0–8.0)

## 2019-11-26 LAB — PROTIME-INR
INR: 2.4 — ABNORMAL HIGH (ref 0.8–1.2)
Prothrombin Time: 26.2 seconds — ABNORMAL HIGH (ref 11.4–15.2)

## 2019-11-26 LAB — POC OCCULT BLOOD, ED: Fecal Occult Bld: NEGATIVE

## 2019-11-26 LAB — RESPIRATORY PANEL BY RT PCR (FLU A&B, COVID)
Influenza A by PCR: NEGATIVE
Influenza B by PCR: NEGATIVE
SARS Coronavirus 2 by RT PCR: NEGATIVE

## 2019-11-26 LAB — TROPONIN I (HIGH SENSITIVITY)
Troponin I (High Sensitivity): 18 ng/L — ABNORMAL HIGH (ref <18)
Troponin I (High Sensitivity): 24 ng/L — ABNORMAL HIGH (ref <18)

## 2019-11-26 LAB — LACTIC ACID, PLASMA
Lactic Acid, Venous: 3.6 mmol/L (ref 0.5–1.9)
Lactic Acid, Venous: 3.8 mmol/L (ref 0.5–1.9)
Lactic Acid, Venous: 4.5 mmol/L (ref 0.5–1.9)
Lactic Acid, Venous: 6.3 mmol/L (ref 0.5–1.9)

## 2019-11-26 MED ORDER — SODIUM CHLORIDE 0.9 % IV SOLN
2.0000 g | Freq: Two times a day (BID) | INTRAVENOUS | Status: DC
  Administered 2019-11-27 – 2019-11-30 (×8): 2 g via INTRAVENOUS
  Filled 2019-11-26 (×10): qty 2

## 2019-11-26 MED ORDER — SODIUM CHLORIDE 0.9 % IV SOLN
2.0000 g | Freq: Once | INTRAVENOUS | Status: AC
  Administered 2019-11-26: 2 g via INTRAVENOUS
  Filled 2019-11-26: qty 2

## 2019-11-26 MED ORDER — ACETAMINOPHEN 325 MG PO TABS: 650.0000 mg | ORAL_TABLET | Freq: Four times a day (QID) | ORAL | Status: DC | PRN

## 2019-11-26 MED ORDER — VANCOMYCIN HCL 1250 MG/250ML IV SOLN: 1250.0000 mg | INTRAVENOUS | Status: DC

## 2019-11-26 MED ORDER — WARFARIN - PHARMACIST DOSING INPATIENT: Freq: Every day | Status: DC

## 2019-11-26 MED ORDER — LACTATED RINGERS IV BOLUS (SEPSIS)
250.0000 mL | Freq: Once | INTRAVENOUS | Status: AC
  Administered 2019-11-26: 15:00:00 250 mL via INTRAVENOUS

## 2019-11-26 MED ORDER — SODIUM CHLORIDE 0.9 % IV BOLUS
500.0000 mL | Freq: Once | INTRAVENOUS | Status: AC
  Administered 2019-11-26: 12:00:00 500 mL via INTRAVENOUS

## 2019-11-26 MED ORDER — METRONIDAZOLE IN NACL 5-0.79 MG/ML-% IV SOLN
500.0000 mg | Freq: Once | INTRAVENOUS | Status: AC
  Administered 2019-11-26: 500 mg via INTRAVENOUS
  Filled 2019-11-26: qty 100

## 2019-11-26 MED ORDER — CARBIDOPA-LEVODOPA 25-100 MG PO TABS
1.0000 | ORAL_TABLET | Freq: Four times a day (QID) | ORAL | Status: DC
  Filled 2019-11-26 (×2): qty 1

## 2019-11-26 MED ORDER — WARFARIN SODIUM 2.5 MG PO TABS
2.5000 mg | ORAL_TABLET | Freq: Once | ORAL | Status: DC
  Filled 2019-11-26: qty 1

## 2019-11-26 MED ORDER — LACTATED RINGERS IV BOLUS (SEPSIS)
1000.0000 mL | Freq: Once | INTRAVENOUS | Status: AC
  Administered 2019-11-26: 1000 mL via INTRAVENOUS

## 2019-11-26 MED ORDER — SODIUM CHLORIDE 0.9% FLUSH
3.0000 mL | Freq: Two times a day (BID) | INTRAVENOUS | Status: DC
  Administered 2019-11-27 – 2019-11-30 (×6): 3 mL via INTRAVENOUS

## 2019-11-26 MED ORDER — VANCOMYCIN HCL 1500 MG/300ML IV SOLN
1500.0000 mg | Freq: Once | INTRAVENOUS | Status: DC
  Administered 2019-11-26: 15:00:00 1500 mg via INTRAVENOUS
  Filled 2019-11-26: qty 300

## 2019-11-26 MED ORDER — CHOLECALCIFEROL 10 MCG (400 UNIT) PO TABS
400.0000 [IU] | ORAL_TABLET | Freq: Two times a day (BID) | ORAL | Status: DC
  Filled 2019-11-26 (×2): qty 1

## 2019-11-26 MED ORDER — MEMANTINE HCL ER 28 MG PO CP24
28.0000 mg | ORAL_CAPSULE | Freq: Every day | ORAL | Status: DC
  Filled 2019-11-26: qty 1

## 2019-11-26 MED ORDER — ACETAMINOPHEN 650 MG RE SUPP: 650.0000 mg | Freq: Four times a day (QID) | RECTAL | Status: DC | PRN

## 2019-11-26 MED ORDER — VANCOMYCIN HCL IN DEXTROSE 1-5 GM/200ML-% IV SOLN: 1000.0000 mg | Freq: Once | INTRAVENOUS | Status: DC

## 2019-11-26 MED ORDER — LACTATED RINGERS IV BOLUS (SEPSIS)
1000.0000 mL | Freq: Once | INTRAVENOUS | Status: AC
  Administered 2019-11-26: 15:00:00 1000 mL via INTRAVENOUS

## 2019-11-26 MED ORDER — CARBIDOPA-LEVODOPA 25-100 MG PO TABS
1.0000 | ORAL_TABLET | Freq: Four times a day (QID) | ORAL | Status: DC
  Administered 2019-11-27 – 2019-11-30 (×15): 1
  Filled 2019-11-26 (×19): qty 1

## 2019-11-26 NOTE — ED Notes (Signed)
Daughter called and updated regarding room number that he will be going to and placement of NG tube. States she will be in to see him.

## 2019-11-26 NOTE — ED Notes (Signed)
IV team at bedside 

## 2019-11-26 NOTE — Progress Notes (Signed)
Pharmacy Antibiotic Note  [name] [name] is a 86 y.o. male admitted on 11/30/2019 with sepsis.  Pharmacy has been consulted for Cefepime and Vancomycin dosing.  Height: 5\' 8"  (172.7 cm) Weight: 149 lb 14.6 oz (68 kg) IBW/kg (Calculated) : 68.4  Temp (24hrs), Avg:97 F (36.1 C), Min:97 F (36.1 C), Max:97 F (36.1 C)  Recent Labs  Lab 12/02/2019 1133 12/05/2019 1136 12/09/2019 1137  WBC  --  8.7  --   CREATININE 0.80 0.96  --   LATICACIDVEN  --   --  3.6*    Estimated Creatinine Clearance: 53.1 mL/min (by C-G formula based on SCr of 0.96 mg/dL).    Allergies  Allergen Reactions  . Aricept [Donepezil Hcl] Other (See Comments)    Symptomatic Bradycardia.  . Shellfish Allergy Other (See Comments)    Causes gout Causes a flare up with Gout    Antimicrobials this admission: 3/8 Cefepime >>  3/8 Vancomycin >>   Dose adjustments this admission:   Microbiology results: Pending   Plan:  - Cefepime 2g IV q12h  - Vancomycin 1500 mg IV x 1 dose - Followed by Vancomycin 1250mg  IV q24hr - Est Calc AUC 523 - Monitor patients renal function and opportunity for de-escalation of ABX  Thank you for allowing pharmacy to be a part of this patient's care.    Duanne Limerick PharmD. BCPS  12/14/2019 1:17 PM

## 2019-11-26 NOTE — Consult Note (Addendum)
Cardiology Consultation:   Patient ID: [name] [name] MRN: [id number]; DOB: 12-01-1930  Admit date: 11/30/2019 Date of Consult: 12/06/2019  Primary Care Provider: [name], DO Primary Cardiologist: Ena Dawley, MD  Primary Electrophysiologist:  [name], MD    Patient Profile:   [name] [name] is a 86 y.o. 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 Jeanell Sparrow.  History of Present Illness:   [name] [name] is a 86 yo male with PMH noted above. He was admitted back in 2016 with syncope and chest pain. He was found to be hypotensive and bradycardic. Required transcutaneous pacing. Noted to have underlying sinus bradycardia and frequent PVCs followed by compensatory pauses. Underwent cath that admission showing nonobstructive CAD but found to have mLAD myocardial bridging noted and placed on CCB. Echo showed an EF of 40-45%. He was seen by Dr. [name] as an outpatient and decision was made to place a loop recorder. He was last seen in the office on 02/2018 for follow up. Noted his LINQ was at RRT but this was discussed with the daughter and decision was made not to remove. Notes indicated recent passing of his spouse. He was continued on his home medications without any changes.   Information is obtained from the chart. He currently lives with his daughter and requires full care from her. She is able to transfer him to the wheelchair. She reports last Friday she noted his INR was elevated at 4.1 and was instructed to hold his coumadin after talking with PharmD. Over the weekend he was in his usual state of health. Did report he was recently started on lisinopril. Says he was in his usual state of health until around 10am this morning. She noted he was diaphoretic and had decreased  responsiveness. She felt his HR was low. She called EMS. On their arrival his HR was reported in the 17s with stable blood pressure.   In the ED his labs showed stable electrolytes, Cr 0.96, WBC 8.7, Lactic Acid 3.6, INR 2.4. CT head was negative for acute findings. CT abd/pelvis showed a large amount of stool, but no obstruction. EKG on arrival showed an accelerated junctional rhythm with ventricular bigeminy. Follow up EKG showed SR with PVCs. He was started on the sepsis protocol in the ED with antibiotics and admitted to IM for further management.   Heart Pathway Score:     Past Medical History:  Diagnosis Date  . Anxiety   . Coronary artery disease, non-occlusive    with history of MI; Cath 2008 w multivessel nonobstructive CAD  . Dementia ([place])   . Depression   . GERD (gastroesophageal reflux disease)   . Hyperlipidemia   . Hypertension   . Memory loss   . Parkinson disease ([place])   . PE (pulmonary embolism)    unprovoked PE completed 6 months of warfarin, warfarin d/ced 02/12/2010, repeat PE 07/10/2010 after a long car ride and now on lifelong coumadin.  . Pituitary microadenoma ([place])    incidental finding CT 12/2009  . Prostate cancer ([place])   . Scoliosis   . Sigmoid volvulus ([place]) 08/02/2016  . Stroke ([place]) 03/2018  . Volvulus of colon ([place]) 06/01/2017   Past Surgical History:  Procedure Laterality Date  . CATARACT EXTRACTION    . COLONOSCOPY N/A 05/30/2017   Procedure: COLONOSCOPY;  Surgeon: Ronald Lobo, MD;  Location: [facility] ENDOSCOPY;  Service: Endoscopy;  Laterality: N/A;  . FLEXIBLE SIGMOIDOSCOPY N/A 08/02/2016   Procedure: FLEXIBLE SIGMOIDOSCOPY;  Surgeon: Ronald Lobo, MD;  Location: [facility] ENDOSCOPY;  Service: Endoscopy;  Laterality: N/A;  . FLEXIBLE SIGMOIDOSCOPY N/A 08/06/2016   Procedure: FLEXIBLE SIGMOIDOSCOPY;  Surgeon: Ronald Lobo, MD;  Location: [facility] ENDOSCOPY;  Service: Endoscopy;  Laterality: N/A;  . FLEXIBLE SIGMOIDOSCOPY Left 06/02/2017   Procedure: FLEXIBLE  SIGMOIDOSCOPY;  Surgeon: Arta Silence, MD;  Location: [facility] ENDOSCOPY;  Service: Endoscopy;  Laterality: Left;  . FLEXIBLE SIGMOIDOSCOPY N/A 06/07/2017   Procedure: FLEXIBLE SIGMOIDOSCOPY for possible decompression;  Surgeon: Otis Brace, MD;  Location: [place];  Service: Gastroenterology;  Laterality: N/A;  . LEFT HEART CATHETERIZATION WITH CORONARY ANGIOGRAM N/A 12/10/2014   Procedure: LEFT HEART CATHETERIZATION WITH CORONARY ANGIOGRAM;  Surgeon: [name], MD;  Location: [facility] CATH LAB;  Service: Cardiovascular;  Laterality: N/A;  . LOOP RECORDER IMPLANT N/A 12/16/2014   Procedure: LOOP RECORDER IMPLANT;  Surgeon: [name], MD;  Location: [facility] CATH LAB;  Service: Cardiovascular;  Laterality: N/A;  . PARTIAL COLECTOMY N/A 06/08/2017   Procedure: SIGMOID  COLECTOMY;  Surgeon: Georganna Skeans, MD;  Location: [facility];  Service: General;  Laterality: N/A;  . PROSTATECTOMY      Home Medications:  Prior to Admission medications   Medication Sig Start Date End Date Taking? Authorizing Provider  acetaminophen (TYLENOL) 500 MG tablet Take 1,000 mg by mouth 2 (two) times daily.    Yes [provider]  Ca Phosphate-Cholecalciferol (CALCIUM/VITAMIN D3 GUMMIES) 250-350 MG-UNIT CHEW Chew 1 Dose by mouth 2 (two) times daily. 07/08/16  Yes [name], DO  carbidopa-levodopa (SINEMET IR) 25-100 MG tablet Take 1 tablet by mouth 4 (four) times daily. 11/19/19  Yes McCue, Janett Billow, NP  cetirizine (ZYRTEC) 5 MG chewable tablet Chew 5 mg by mouth daily.   Yes [provider]  lisinopril (ZESTRIL) 5 MG tablet Take 1 tablet (5 mg total) by mouth daily. 09/17/19 09/16/20 Yes Kathi Ludwig, MD  memantine (NAMENDA XR) 28 MG CP24 24 hr capsule TAKE 1 CAPSULE (28 MG TOTAL) BY MOUTH DAILY. 11/05/19  Yes McCue, Janett Billow, NP  Multiple Vitamins-Minerals (MULTIVITAMIN WITH MINERALS) tablet Take 1 tablet by mouth daily.    Yes [provider]  omega-3 acid ethyl esters (LOVAZA) 1 g  capsule Take 1 g by mouth daily.   Yes [provider]  omeprazole (PRILOSEC) 10 MG capsule Take 10 mg by mouth daily as needed (for heartburn). OTC    Yes [provider]  polyethylene glycol (MIRALAX / GLYCOLAX) packet Take 17 g by mouth daily as needed (constipation).   Yes [provider]  potassium chloride SA (KLOR-CON M20) 20 MEQ tablet Take 2 tablets (40 mEq total) by mouth daily. 09/05/19  Yes [name], DO  senna (SENOKOT) 8.6 MG TABS tablet Take 1 tablet (8.6 mg total) by mouth daily as needed for mild constipation. 07/13/16  Yes Velna Ochs, MD  warfarin (COUMADIN) 5 MG tablet TAKE 1/2 TABLET BY MOUTH EVERY THU, AND SAT AND 1 TABLET BY MOUTH ALL OTHER DAYS Patient taking differently: Take 2.5-5 mg by mouth daily. TAKE 1/2 TABLET BY MOUTH EVERY THU, AND SAT AND 1 TABLET BY MOUTH ALL OTHER DAYS 10/16/19  Yes Pennie Banter, RPH-CPP  triamcinolone cream (KENALOG) 0.5 % Apply 1 application topically 3 (three) times daily. Patient not taking: Reported on 11/30/2019 10/18/19   Mitzi Hansen, MD  Inpatient Medications: Scheduled Meds:  Continuous Infusions: . [START ON 11/27/2019] ceFEPime (MAXIPIME) IV    . metronidazole 500 mg (12/15/2019 1441)  . [START ON 11/27/2019] vancomycin    . vancomycin 1,500 mg (11/25/2019 1442)   PRN Meds:   Allergies:    Allergies  Allergen Reactions  . Aricept [Donepezil Hcl] Other (See Comments)    Symptomatic Bradycardia.  . Shellfish Allergy Other (See Comments)    Causes gout Causes a flare up with Gout    Social History:   Social History   Socioeconomic History  . Marital status: Married    Spouse name: Not on file  . Number of children: 4  . Years of education: master's  . Highest education level: Not on file  Occupational History  . Occupation: Retired    Fish farm manager: RETIRED  Tobacco Use  . Smoking status: Never Smoker  . Smokeless tobacco: Never Used  Substance and Sexual Activity  . Alcohol  use: No    Alcohol/week: 0.0 standard drinks  . Drug use: No  . Sexual activity: Never  Other Topics Concern  . Not on file  Social History Narrative   Lives with daughter. Wife also has severe dementia.    Drinks 1 cup of coffee a day    Social Determinants of Health   Financial Resource Strain:   . Difficulty of Paying Living Expenses: Not on file  Food Insecurity:   . Worried About Charity fundraiser in the Last Year: Not on file  . Ran Out of Food in the Last Year: Not on file  Transportation Needs:   . Lack of Transportation (Medical): Not on file  . Lack of Transportation (Non-Medical): Not on file  Physical Activity:   . Days of Exercise per Week: Not on file  . Minutes of Exercise per Session: Not on file  Stress:   . Feeling of Stress : Not on file  Social Connections:   . Frequency of Communication with Friends and Family: Not on file  . Frequency of Social Gatherings with Friends and Family: Not on file  . Attends Religious Services: Not on file  . Active Member of Clubs or Organizations: Not on file  . Attends Archivist Meetings: Not on file  . Marital Status: Not on file  Intimate Partner Violence:   . Fear of Current or Ex-Partner: Not on file  . Emotionally Abused: Not on file  . Physically Abused: Not on file  . Sexually Abused: Not on file    Family History:    Family History  Problem Relation Age of Onset  . Hypertension Father        Passed away from cerebral hemorrhage at age of 71.  [name] Dementia Mother   . Hypertension Child        4 adult children  . Cervical cancer Other   . Lung cancer Other   . Stroke Other   . Heart attack Neg Hx      ROS:  Please see the history of present illness.   All other ROS reviewed and negative.     Physical Exam/Data:   Vitals:   11/25/2019 1245 11/24/2019 1300 11/23/2019 1315 12/13/2019 1330  BP: 106/65 101/65 (!) 103/57 106/61  Pulse:      Resp: 16 17 17 18   Temp:      TempSrc:      SpO2:        Weight:  68 kg    Height:  5'  8" (1.727 m)     No intake or output data in the 24 hours ending 11/25/2019 1518 Last 3 Weights 12/03/2019 11/20/2019 10/18/2019  Weight (lbs) 149 lb 14.6 oz (No Data) (No Data)  Weight (kg) 68 kg (No Data) (No Data)     Body mass index is 22.79 kg/m.  General:  Thin, frail older AAM, in no acute distress. He is minimally responsive to verbal stimuli. HEENT: normal Lymph: no adenopathy Neck: no JVD Vascular: No carotid bruits Cardiac:  normal S1, S2; brady; no murmur  Lungs:  clear to auscultation bilaterally, no wheezing, rhonchi or rales  Abd: soft, nontender, no hepatomegaly  Ext: no edema Musculoskeletal:  No deformities, BUE and BLE strength normal and equal Skin: warm and dry  Neuro:  CNs 2-12 intact, no focal abnormalities noted Psych:  Normal affect   EKG:  The EKG was personally reviewed and demonstrates:  EKG on arrival showed an accelerated junctional rhythm with ventricular bigeminy with RBBB. -- Follow up EKG showed SR with PVCs. Telemetry:  Telemetry was personally reviewed and demonstrates:  Junctional with ventricular bigeminy then transitioned to SB with PVCs.   Relevant CV Studies:  TTE: 11/2014  Study Conclusions   - Left ventricle: The cavity size was normal. Wall thickness was  normal. Systolic function was mildly to moderately reduced. The  estimated ejection fraction was in the range of 40% to 45%.  Regional wall motion abnormalities cannot be excluded. Doppler  parameters are consistent with abnormal left ventricular  relaxation (grade 1 diastolic dysfunction).   Laboratory Data:  High Sensitivity Troponin:   Recent Labs  Lab 12/15/2019 1136  TROPONINIHS 18*     Chemistry Recent Labs  Lab 12/06/2019 1133 12/17/2019 1136  NA 140 142  K 3.4* 3.6  CL 107 108  CO2  --  23  GLUCOSE 132* 141*  BUN 13 12  CREATININE 0.80 0.96  CALCIUM  --  9.6  GFRNONAA  --  >60  GFRAA  --  >60  ANIONGAP  --  11    Recent  Labs  Lab 11/28/2019 1136  PROT 6.5  ALBUMIN 3.2*  AST 32  ALT 10  ALKPHOS 42  BILITOT 1.0   Hematology Recent Labs  Lab 12/10/2019 1133 12/05/2019 1136  WBC  --  8.7  RBC  --  4.11*  HGB 13.6 12.8*  HCT 40.0 41.5  MCV  --  101.0*  MCH  --  31.1  MCHC  --  30.8  RDW  --  14.9  PLT  --  208   BNPNo results for input(s): BNP, PROBNP in the last 168 hours.  DDimer No results for input(s): DDIMER in the last 168 hours.   Radiology/Studies:  CT ABDOMEN PELVIS WO CONTRAST  Result Date: 12/02/2019 CLINICAL DATA:  Abdominal distension. Peritonitis or perforation suspected. EXAM: CT ABDOMEN AND PELVIS WITHOUT CONTRAST TECHNIQUE: Multidetector CT imaging of the abdomen and pelvis was performed following the standard protocol without IV contrast. COMPARISON:  03/13/2019 FINDINGS: Lower chest: Left hemidiaphragm elevation with left greater than right lower lobe atelectasis. No pleural effusion. LAD and right coronary artery atherosclerosis. Normal heart size. Hepatobiliary: No focal liver abnormality is seen. No gallstones, gallbladder wall thickening, or biliary dilatation. Pancreas: Unremarkable. Spleen: Unremarkable. Adrenals/Urinary Tract: Unremarkable adrenal glands. No renal calculi or hydronephrosis. Unchanged 4.2 cm right lower pole renal cyst. Nondistended bladder which is displaced anteriorly by rectal stool. Stomach/Bowel: Sequelae of sigmoid colectomy are again identified. There is a large amount  of stool extending from the splenic flexure to the rectum, much greater than on the prior study. The rectum is distended to 8.5 cm in diameter. A small amount of stool is present in the more proximal colon, and there is no evidence of bowel obstruction. No gross inflammation is identified. Vascular/Lymphatic: Mild abdominal aortic atherosclerosis without aneurysm. No enlarged lymph nodes. Reproductive: Status post prostatectomy. Other: No ascites or pneumoperitoneum. Musculoskeletal: No acute  osseous abnormality or suspicious osseous lesion. Moderate lumbar dextroscoliosis. Severe asymmetric left-sided disc space narrowing at L2-3 and L3-4 with interbody ankylosis. IMPRESSION: 1. Large amount of stool throughout the left colon and rectum. No evidence of bowel obstruction. 2. Aortic Atherosclerosis (ICD10-I70.0). Electronically Signed   By: Logan Bores M.D.   On: 12/03/2019 14:18   CT Head Wo Contrast  Result Date: 12/09/2019 CLINICAL DATA:  Altered mental status. EXAM: CT HEAD WITHOUT CONTRAST TECHNIQUE: Contiguous axial images were obtained from the base of the skull through the vertex without intravenous contrast. COMPARISON:  None. FINDINGS: Brain: No evidence of acute infarction, hemorrhage, hydrocephalus, extra-axial collection or mass effect. 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 There is extensive confluent periventricular white matter hypodensity consistent with chronic small vessel ischemic change. Diffuse atrophy. Vascular: Calcified atherosclerosis. No hyperdense vessel. Skull: Normal. Negative for fracture or focal lesion. Sinuses/Orbits: No acute finding. Other: None. IMPRESSION: 1. No acute intracranial findings. 2. 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 3. Extensive chronic small vessel ischemic change. Electronically Signed   By: Audie Pinto M.D.   On: 11/19/2019 12:32   DG Chest Port 1 View  Result Date: 11/22/2019 CLINICAL DATA:  Weakness, bradycardia EXAM: PORTABLE CHEST 1 VIEW COMPARISON:  09/02/2019 chest radiograph. FINDINGS: Chronic prominent elevation of the left hemidiaphragm. Stable cardiomediastinal silhouette with normal heart size. No pneumothorax. No pleural effusion. Mild left lung base atelectasis. No pulmonary edema. No acute consolidative airspace disease. Loop recorder overlies the left lower chest. IMPRESSION: Chronic prominent elevation of the left hemidiaphragm with mild left lung base atelectasis. No acute  cardiopulmonary disease. Electronically Signed   By: Ilona Sorrel M.D.   On: 12/12/2019 12:07   Assessment and Plan:   [name] [name] is a 86 y.o. 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 Jeanell Sparrow.  1. Bradycardia: He was noted to be in an accelerated junctional rhythm on arrival with ventricular bigeminy, then converted to SB with PVCs. He has hx of bradycardia and followed by Dr. [name] in the past with [facility] placed. He is not currently on any AVN blocking agents prior to admission. His lactic acid was elevated on admission, and now being admitted for sepsis. Will check echo. Given his age, functional baseline and advanced dementia would not consider him a good candidate for invasive therapies.   2. Sepsis: Lactic acid 3.6>>3.8 on admission from suspected UTI. Management per primary.   3. HTN: appears he was recently started on lisinopril. This has been held on admission given his sepsis.   4. Dementia: advanced. He is cared for by his daughter and fully dependent on her for ADLs.   5. Hx of PE: on coumadin as an outpatient. INR 2.4 on admission.   For questions or updates, please contact [place] Please consult [email or web address] for contact info under  Signed, Reino Bellis, NP  12/11/2019 3:18 PM   The patient was seen, examined and discussed with Reino Bellis, NP-C and I agree with the above.   86 y.o. 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    The patient has known dementia for years and poor baseline functional capacity, he does not walk and his daughter has to move him in  and out of wheelchair.  He also has history of bradycardia several years ago, he has a loop recorder that did not show any significant pauses just sinus bradycardia and PVCs.  On presentation today he was in junctional rhythm with ventricular bigeminy, this has resolved and he is currently in sinus rhythm with occasional PVCs.  EKG shows sinus rhythm right bundle branch block.  On physical exam the patient is not arousable, he has no carotid bruits, he has S1-S2 no significant murmur, clear lungs, his extremities are a lot of cold and I do not feel peripheral pulses in bilateral lower extremities.  He is labs show lactic acid of 3.8, WBC hemoglobin and platelets, INR 2.4, potassium 3.6, creatinine 1.9, borderline troponin of 18. His most recent echocardiogram in 2016 showed LVEF 40 to 45%.  Plan: Replace potassium, obtain magnesium level and replace if less than 2.  Obtain an echocardiogram to reevaluate LVEF, patient is currently in sinus rhythm, considering very poor baseline functional status we will not plan for any further intervention, we will follow he is results and telemetry.  Ena Dawley, MD 12/05/2019

## 2019-11-26 NOTE — ED Provider Notes (Signed)
[facility] EMERGENCY DEPARTMENT Provider Note   CSN: [id number] Arrival date & time: 11/29/2019  1118     History Chief Complaint  Patient presents with  . Bradycardia  . Hypotension    [name] [name] is a 86 y.o. male.  HPI    Level 5 caveat 86 yo male ho parkinosian syndrome, gerd, dementia, cad, presents today with decrease mental status and bradycardia.  History is obtained from his daughter.  She states that he is normally full care which she is able to get him up into a wheelchair.  She states that he gives her some answers, does not sound as though these are actual words.  She states he was in his normal state at 930.  At 10:00 she noted he was diaphoretic and had decreased responsiveness.  EMS.  EMS reports that he was recently started on lisinopril.  EMS reports that his heart rate was 84 on the scene with a blood pressure 124/68 with a monitor showing sinus bradycardia with frequent PVCs and blood sugar of 138.  They also report normal respiratory rate 16, normal sats at 99% and temperature 97.2 Daughter states that she is power of attorney and he is full code.  Past Medical History:  Diagnosis Date  . Anxiety   . Coronary artery disease, non-occlusive    with history of MI; Cath 2008 w multivessel nonobstructive CAD  . Dementia ([place])   . Depression   . GERD (gastroesophageal reflux disease)   . Hyperlipidemia   . Hypertension   . Memory loss   . Parkinson disease ([place])   . PE (pulmonary embolism)    unprovoked PE completed 6 months of warfarin, warfarin d/ced 02/12/2010, repeat PE 07/10/2010 after a long car ride and now on lifelong coumadin.  . Pituitary microadenoma ([place])    incidental finding CT 12/2009  . Prostate cancer ([place])   . Scoliosis   . Sigmoid volvulus ([place]) 08/02/2016  . Stroke ([place]) 03/2018  . Volvulus of colon ([place]) 06/01/2017    Patient Active Problem List   Diagnosis Date Noted  . Allergies 11/20/2019  . Vesicular rash  10/20/2019  . Goals of care, counseling/discussion 09/18/2019  . TIA (transient ischemic attack) 09/04/2019  . Altered mental status   . Dehydration   . Hyperchloremia   . Hypernatremia 10/23/2018  . Ecchymosis 08/07/2018  . Pituitary macroadenoma ([place]) 06/06/2018  . Hemorrhagic stroke ([place])   . Prediabetes   . Dysphagia   . Anemia of chronic disease   . ICH (intracerebral hemorrhage) ([place]) 05/05/2018  . Chalazion left upper eyelid 04/11/2018  . Lethargy 11/10/2017  . Fatigue 09/08/2017  . Need for immunization against influenza 09/08/2017  . Recurrent UTI 06/27/2017  . Pressure sore on buttocks 06/27/2017  . Volvulus of sigmoid colon ([place]) 05/31/2017  . Aortic atherosclerosis ([place]) 05/31/2017  . Loss of weight 09/23/2016  . Chronic anticoagulation   . Right bundle branch block   . Hypokalemia 08/02/2016  . Diverticulosis 07/15/2016  . Statin myopathy 03/15/2016  . Parkinsonian syndrome ([place]) 08/11/2015  . Normocytic anemia, not due to blood loss 04/16/2015  . Chronic systolic heart failure ([place]) 04/16/2015  . Falls frequently 04/16/2015  . Onychomycosis 04/15/2015  . CAD (coronary artery disease) 01/16/2015  . 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 Healthcare maintenance 10/10/2014  . Lumbosacral spondylosis without myelopathy 09/24/2013  . Bradycardia 02/20/2013  . Generalized ischemic cerebrovascular disease 02/05/2013  . Depression 01/25/2013  . Insomnia 01/20/2012  . Alzheimer's dementia ([place]) 08/17/2011  .  History of pulmonary embolus (PE) 09/18/2010  . Encounter for long-term use of antiplatelets/antithrombotics 09/18/2010  . DJD (degenerative joint disease) 08/06/2010  . ADENOCARCINOMA, PROSTATE, HX OF 08/06/2010  . HLD (hyperlipidemia) 10/15/2009  . Essential hypertension, benign 10/15/2009  . GERD 10/15/2009    Past Surgical History:  Procedure Laterality Date  . CATARACT EXTRACTION    . COLONOSCOPY N/A 05/30/2017   Procedure: COLONOSCOPY;  Surgeon: Ronald Lobo, MD;  Location: [facility] ENDOSCOPY;  Service: Endoscopy;  Laterality: N/A;  . FLEXIBLE SIGMOIDOSCOPY N/A 08/02/2016   Procedure: FLEXIBLE SIGMOIDOSCOPY;  Surgeon: Ronald Lobo, MD;  Location: [facility] ENDOSCOPY;  Service: Endoscopy;  Laterality: N/A;  . FLEXIBLE SIGMOIDOSCOPY N/A 08/06/2016   Procedure: FLEXIBLE SIGMOIDOSCOPY;  Surgeon: Ronald Lobo, MD;  Location: [facility] ENDOSCOPY;  Service: Endoscopy;  Laterality: N/A;  . FLEXIBLE SIGMOIDOSCOPY Left 06/02/2017   Procedure: FLEXIBLE SIGMOIDOSCOPY;  Surgeon: Arta Silence, MD;  Location[facility]COPY;  Service: Endoscopy;  Laterality: Left;  . FLEXIBLE SIGMOIDOSCOPY N/A 06/07/2017   Procedure: FLEXIBLE SIGMOIDOSCOPY for possible decompression;  Surgeon: Otis Brace, MD;  Location: [facility];  Service: Gastroenterology;  Laterality: N/A;  . LEFT HEART CATHETERIZATION WITH CORONARY ANGIOGRAM N/A 12/10/2014   Procedure: LEFT HEART CATHETERIZATION WITH CORONARY ANGIOGRAM;  Surgeon: [name], MD;  Location: [facility] CATH LAB;  Service: Cardiovascular;  Laterality: N/A;  . LOOP RECORDER IMPLANT N/A 12/16/2014   Procedure: LOOP RECORDER IMPLANT;  Surgeon: [name], MD;  Location: [facility] CATH LAB;  Service: Cardiovascular;  Laterality: N/A;  . PARTIAL COLECTOMY N/A 06/08/2017   Procedure: SIGMOID  COLECTOMY;  Surgeon: Georganna Skeans, MD;  Location: [facility];  Service: General;  Laterality: N/A;  . PROSTATECTOMY         Family History  Problem Relation Age of Onset  . Hypertension Father        Passed away from cerebral hemorrhage at age of 67.  [name] Dementia Mother   . Hypertension Child        4 adult children  . Cervical cancer Other   . Lung cancer Other   . Stroke Other   . Heart attack Neg Hx     Social History   Tobacco Use  . Smoking status: Never Smoker  . Smokeless tobacco: Never Used  Substance Use Topics  . Alcohol use: No    Alcohol/week: 0.0 standard drinks  . Drug use: No    Home Medications Prior to Admission  medications   Medication Sig Start Date End Date Taking? Authorizing Provider  acetaminophen (TYLENOL) 500 MG tablet Take 1,000 mg by mouth 2 (two) times daily.    Yes [provider]  Ca Phosphate-Cholecalciferol (CALCIUM/VITAMIN D3 GUMMIES) 250-350 MG-UNIT CHEW Chew 1 Dose by mouth 2 (two) times daily. 07/08/16  Yes [name], DO  carbidopa-levodopa (SINEMET IR) 25-100 MG tablet Take 1 tablet by mouth 4 (four) times daily. 11/19/19  Yes McCue, Janett Billow, NP  cetirizine (ZYRTEC) 5 MG chewable tablet Chew 5 mg by mouth daily.   Yes [provider]  lisinopril (ZESTRIL) 5 MG tablet Take 1 tablet (5 mg total) by mouth daily. 09/17/19 09/16/20 Yes Kathi Ludwig, MD  memantine (NAMENDA XR) 28 MG CP24 24 hr capsule TAKE 1 CAPSULE (28 MG TOTAL) BY MOUTH DAILY. 11/05/19  Yes McCue, Janett Billow, NP  Multiple Vitamins-Minerals (MULTIVITAMIN WITH MINERALS) tablet Take 1 tablet by mouth daily.    Yes [provider]  omega-3 acid ethyl esters (LOVAZA) 1 g capsule Take 1 g by mouth daily.  Yes [provider]  omeprazole (PRILOSEC) 10 MG capsule Take 10 mg by mouth daily as needed (for heartburn). OTC    Yes [provider]  polyethylene glycol (MIRALAX / GLYCOLAX) packet Take 17 g by mouth daily as needed (constipation).   Yes [provider]  potassium chloride SA (KLOR-CON M20) 20 MEQ tablet Take 2 tablets (40 mEq total) by mouth daily. 09/05/19  Yes [name], DO  senna (SENOKOT) 8.6 MG TABS tablet Take 1 tablet (8.6 mg total) by mouth daily as needed for mild constipation. 07/13/16  Yes Velna Ochs, MD  warfarin (COUMADIN) 5 MG tablet TAKE 1/2 TABLET BY MOUTH EVERY THU, AND SAT AND 1 TABLET BY MOUTH ALL OTHER DAYS Patient taking differently: Take 2.5-5 mg by mouth daily. TAKE 1/2 TABLET BY MOUTH EVERY THU, AND SAT AND 1 TABLET BY MOUTH ALL OTHER DAYS 10/16/19  Yes Pennie Banter, RPH-CPP  triamcinolone cream (KENALOG) 0.5 % Apply 1  application topically 3 (three) times daily. Patient not taking: Reported on 12/18/2019 10/18/19   Mitzi Hansen, MD    Allergies    Aricept Reather [place]ler hcl] and Shellfish allergy  Review of Systems   Review of Systems  Unable to perform ROS: Mental status change    Physical Exam Updated Vital Signs BP (!) 75/57   Pulse (!) 35   Temp (!) 97 F (36.1 C) (Rectal)   Resp 14   SpO2 93%   Physical Exam Vitals and nursing note reviewed.  Constitutional:      General: He is not in acute distress.    Appearance: He is ill-appearing.     Comments: Eyes are closed holds them closed against attempting to open them  HENT:     Head: Normocephalic and atraumatic.     Right Ear: External ear normal.     Left Ear: External ear normal.     Mouth/Throat:     Mouth: Mucous membranes are moist.  Cardiovascular:     Rate and Rhythm: Bradycardia present. Rhythm irregular.  Pulmonary:     Effort: Pulmonary effort is normal.     Breath sounds: Normal breath sounds.  Abdominal:     General: Abdomen is flat.     Tenderness: There is no abdominal tenderness. There is guarding.  Genitourinary:    Penis: Normal.   Musculoskeletal:     Cervical back: Normal range of motion.  Skin:    General: Skin is warm and dry.     Capillary Refill: Capillary refill takes less than 2 seconds.     Findings: No lesion or rash.  Neurological:     General: No focal deficit present.     ED Results / Procedures / Treatments   Labs (all labs ordered are listed, but only abnormal results are displayed) Labs Reviewed  CBC WITH DIFFERENTIAL/PLATELET - Abnormal; Notable for the following components:      Result Value   RBC 4.11 (*)    Hemoglobin 12.8 (*)    MCV 101.0 (*)    All other components within normal limits  COMPREHENSIVE METABOLIC PANEL - Abnormal; Notable for the following components:   Glucose, Bld 141 (*)    Albumin 3.2 (*)    All other components within normal limits  PROTIME-INR - Abnormal;  Notable for the following components:   Prothrombin Time 26.2 (*)    INR 2.4 (*)    All other components within normal limits  LACTIC ACID, PLASMA - Abnormal; Notable for the following components:  Lactic Acid, Venous 3.6 (*)    All other components within normal limits  I-STAT CHEM 8, ED - Abnormal; Notable for the following components:   Potassium 3.4 (*)    Glucose, Bld 132 (*)    All other components within normal limits  TROPONIN I (HIGH SENSITIVITY) - Abnormal; Notable for the following components:   Troponin I (High Sensitivity) 18 (*)    All other components within normal limits  CULTURE, BLOOD (ROUTINE X 2)  CULTURE, BLOOD (ROUTINE X 2)  URINALYSIS, ROUTINE W REFLEX MICROSCOPIC  POC OCCULT BLOOD, ED    EKG EKG Interpretation  Date/Time:  Monday November 26 2019 11:20:20 EST Ventricular Rate:  78 PR Interval:    QRS Duration: 148 QT Interval:  465 QTC Calculation: 394 R Axis:   29 Text Interpretation: Accelerated junctional rhythm Ventricular bigeminy Right bundle branch block Confirmed by Pattricia Boss [phone]) on 11/21/2019 12:56:57 PM  Repeat ekg ED ECG REPORT   Date: 11/24/2019  Rate: 68  Rhythm: normal sinus rhythm  QRS Axis: normal  Intervals: normal  ST/T Wave abnormalities: normal  Conduction Disutrbances:none  Narrative Interpretation:   Old EKG Reviewed: decreased pvcs  I have personally reviewed the EKG tracing and agree with the computerized printout as noted.  Radiology CT Head Wo Contrast  Result Date: 11/21/2019 CLINICAL DATA:  Altered mental status. EXAM: CT HEAD WITHOUT CONTRAST TECHNIQUE: Contiguous axial images were obtained from the base of the skull through the vertex without intravenous contrast. COMPARISON:  None. FINDINGS: Brain: No evidence of acute infarction, hemorrhage, hydrocephalus, extra-axial collection or mass effect. 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 There is extensive confluent periventricular white  matter hypodensity consistent with chronic small vessel ischemic change. Diffuse atrophy. Vascular: Calcified atherosclerosis. No hyperdense vessel. Skull: Normal. Negative for fracture or focal lesion. Sinuses/Orbits: No acute finding. Other: None. IMPRESSION: 1. No acute intracranial findings. 2. 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 3. Extensive chronic small vessel ischemic change. Electronically Signed   By: Audie Pinto M.D.   On: 12/12/2019 12:32   DG Chest Port 1 View  Result Date: 11/19/2019 CLINICAL DATA:  Weakness, bradycardia EXAM: PORTABLE CHEST 1 VIEW COMPARISON:  09/02/2019 chest radiograph. FINDINGS: Chronic prominent elevation of the left hemidiaphragm. Stable cardiomediastinal silhouette with normal heart size. No pneumothorax. No pleural effusion. Mild left lung base atelectasis. No pulmonary edema. No acute consolidative airspace disease. Loop recorder overlies the left lower chest. IMPRESSION: Chronic prominent elevation of the left hemidiaphragm with mild left lung base atelectasis. No acute cardiopulmonary disease. Electronically Signed   By: Ilona Sorrel M.D.   On: 12/10/2019 12:07    Procedures Procedures (including critical care time)  Medications Ordered in ED Medications  sodium chloride 0.9 % bolus 500 mL (500 mLs Intravenous New Bag/Given 12/15/2019 1201)    ED Course  I have reviewed the triage vital signs and the nursing notes.  Pertinent labs & imaging results that were available during my care of the patient were reviewed by me and considered in my medical decision making (see chart for details).    MDM Rules/Calculators/A&P                      Bradycardia/hypotension- ? Due to cardiac etiology as primary vs medication vs toxic/infectious.  Patient also meets sepsis criteria with hypothermia, elevated lactic.  Blood cultures ordered, broad spectrum abx, and fluids.   Plan consult to cardiology and IM  teaching service as primary  care. Discussed with Dr. Frederico Hamman Discussed with cardiology- will see in consult Reviewed CT- large amount of stool in colon, but no evidence of obstruction.   Final Clinical Impression(s) / ED Diagnoses Final diagnoses:  None    Rx / DC Orders ED Discharge Orders    None       Pattricia Boss, MD 12/09/2019 Drema Halon

## 2019-11-26 NOTE — H&P (Addendum)
Date: 12/06/2019               Patient Name:  [name] [name] MRN: [id number]  DOB: [birth year] Age / Sex: 86 y.o., male   PCP: [name], DO              Medical Service: Internal Medicine Teaching Service              Attending Physician: Dr. Velna Ochs, MD    First Contact: Mikael Spray, MS Pager: [phone]  Second Contact: Dr. Charleen Kirks Pager: [phone]  Third Contact Dr. Eileen Stanford Pager: [phone]       After Hours (After 5p/  First Contact Pager: [phone]  weekends / holidays): Second Contact Pager: [phone]   Chief Complaint: Slow heart rate and profuse sweating  History of Present Illness:  [name] [name] is a 86 year old male with a past medical history of parkinson's, pulmonary embolism, systolic heart failure, sigmoid volvulus, bradycardia, and hypertension who presented to the ED today with his daughter who noticed his heart rate was slow with morning and that he was profusely sweaty. She said that week he suddenly began having more difficulty with swallowing and that his blood pressure was not as controlled as it had been previously. Then on Friday of last week she noted that his INR was 4.1 so she withheld his warfarin after consulting with the pharmacist. Over the weekend she was able to continue range of motion exercises that she generally preforms with him until Sunday night. Additionally, on Sunday night his daughter noticed that blood pressure was in the 150/92. Then this morning when the daughter went to change him she noticed that there was significantly more urine and that it had different smell. She stated that it smelled like there was an infection. It was at that time that she noticed his profuse diaphoresis and that his pulse was low.  The patient's daughter denies any complaints of nausea, vomiting, diarrhea, sick contacts, fevers, and cognitive changes.  History was obtained from the patient's daughter.   Meds: Started lisinopril about a month ago, then  dose was lowered to 5mg  about 3 days after Recently switched from zyrtec to Claritin   Current Meds  Medication Sig  . acetaminophen (TYLENOL) 500 MG tablet Take 1,000 mg by mouth 2 (two) times daily.   . Ca Phosphate-Cholecalciferol (CALCIUM/VITAMIN D3 GUMMIES) 250-350 MG-UNIT CHEW Chew 1 Dose by mouth 2 (two) times daily.  . carbidopa-levodopa (SINEMET IR) 25-100 MG tablet Take 1 tablet by mouth 4 (four) times daily.  . cetirizine (ZYRTEC) 5 MG chewable tablet Chew 5 mg by mouth daily.  [name] lisinopril (ZESTRIL) 5 MG tablet Take 1 tablet (5 mg total) by mouth daily.  . memantine (NAMENDA XR) 28 MG CP24 24 hr capsule TAKE 1 CAPSULE (28 MG TOTAL) BY MOUTH DAILY.  . Multiple Vitamins-Minerals (MULTIVITAMIN WITH MINERALS) tablet Take 1 tablet by mouth daily.   [name] omega-3 acid ethyl esters (LOVAZA) 1 g capsule Take 1 g by mouth daily.  [name] omeprazole (PRILOSEC) 10 MG capsule Take 10 mg by mouth daily as needed (for heartburn). OTC   . polyethylene glycol (MIRALAX / GLYCOLAX) packet Take 17 g by mouth daily as needed (constipation).  . potassium chloride SA (KLOR-CON M20) 20 MEQ tablet Take 2 tablets (40 mEq total) by mouth daily.  [name] senna (SENOKOT) 8.6 MG TABS tablet Take 1 tablet (8.6 mg total) by mouth daily as needed for mild constipation.  [name] warfarin (COUMADIN) 5 MG tablet  TAKE 1/2 TABLET BY MOUTH EVERY THU, AND SAT AND 1 TABLET BY MOUTH ALL OTHER DAYS (Patient taking differently: Take 2.5-5 mg by mouth daily. TAKE 1/2 TABLET BY MOUTH EVERY THU, AND SAT AND 1 TABLET BY MOUTH ALL OTHER DAYS)     Allergies: Allergies as of 11/28/2019 - Review Complete 11/28/2019  Allergen Reaction Noted  . Aricept [donepezil hcl] Other (See Comments) 07/24/2013  . Shellfish allergy Other (See Comments) 12/01/2015   Past Medical History:  Diagnosis Date  . Anxiety   . Coronary artery disease, non-occlusive    with history of MI; Cath 2008 w multivessel nonobstructive CAD  . Dementia ([place])   . Depression   .  GERD (gastroesophageal reflux disease)   . Hyperlipidemia   . Hypertension   . Memory loss   . Parkinson disease ([place])   . PE (pulmonary embolism)    unprovoked PE completed 6 months of warfarin, warfarin d/ced 02/12/2010, repeat PE 07/10/2010 after a long car ride and now on lifelong coumadin.  . Pituitary microadenoma ([place])    incidental finding CT 12/2009  . Prostate cancer ([place])   . Scoliosis   . Sigmoid volvulus ([place]) 08/02/2016  . Stroke ([place]) 03/2018  . Volvulus of colon ([place]) 06/01/2017    Family History:  Family History  Problem Relation Age of Onset  . Hypertension Father        Passed away from cerebral hemorrhage at age of 10.  [name] Dementia Mother   . Hypertension Child        4 adult children  . Cervical cancer Other   . Lung cancer Other   . Stroke Other   . Heart attack Neg Hx    Social History:  The patient lives at home with his daughter and granddaughter. His daughter is his primary caregiver. His diet consists of pureed foods and he is able to communicate when his swallowing is not capable of taking in food. He is capable of staying sitting up on his own if placed in a chair, but cannot get himself to sitting on his own. He does range of motion exercises with his daughter daily, and has been able to do these exercises up to and including last night.   Review of Systems: A complete ROS was negative except as per HPI.   Physical Exam: Blood pressure 106/61, pulse 62, temperature (!) 97 F (36.1 C), temperature source Rectal, resp. rate 18, height 5\' 8"  (1.727 m), weight 68 kg, SpO2 100 %. Physical Exam Cardiovascular:     Rate and Rhythm: Regular rhythm. Bradycardia present.     Heart sounds: Normal heart sounds.  Pulmonary:     Breath sounds: Normal breath sounds.  Abdominal:     Palpations: Abdomen is rigid.     Tenderness: There is no abdominal tenderness.  Musculoskeletal:     Right lower leg: No edema.     Left lower leg: No edema.  Skin:     General: Skin is cool and dry.  Neurological:     Mental Status: He is lethargic.     EKG: personally reviewed my interpretation is bradycardic, with preventricular beats, and right bundle branch block. Possible atrial fibrillation.  Unchanged from base line EKG  CXR: personally reviewed my interpretation is diaphragm is high and left lung base appears to be obscured. However, this is all unchanged from baseline chest X ray from 09/02/19.  INR:  12/08/2019 2.4  Tropinin: 12/17/2019 18  Lactic Acid: 11/21/2019 3.6  Urinalysis:  Pending  Urine culture: Pending  Blood cultures: Pending  POC occult blood: negative  CBC Latest Ref Rng & Units 12/14/2019 12/13/2019 09/02/2019  WBC 4.0 - 10.5 K/uL 8.7 - 3.7(L)  Hemoglobin 13.0 - 17.0 g/dL 12.8(L) 13.6 13.7  Hematocrit 39.0 - 52.0 % 41.5 40.0 43.2  Platelets 150 - 400 K/uL 208 - 201   CMP Latest Ref Rng & Units 11/23/2019 12/17/2019 09/17/2019  Glucose 70 - 99 mg/dL 141(H) 132(H) 72  BUN 8 - 23 mg/dL 12 13 10   Creatinine 0.61 - 1.24 mg/dL 0.96 0.80 0.63(L)  Sodium 135 - 145 mmol/L 142 140 140  Potassium 3.5 - 5.1 mmol/L 3.6 3.4(L) 4.4  Chloride 98 - 111 mmol/L 108 107 99  CO2 22 - 32 mmol/L 23 - 22  Calcium 8.9 - 10.3 mg/dL 9.6 - 9.8  Total Protein 6.5 - 8.1 g/dL 6.5 - -  Total Bilirubin 0.3 - 1.2 mg/dL 1.0 - -  Alkaline Phos 38 - 126 U/L 42 - -  AST 15 - 41 U/L 32 - -  ALT 0 - 44 U/L 10 - -   Head CT without contrast: 1. No acute intracranial findings. 2. 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 3. Extensive chronic small vessel ischemic change.  Abdomen CT without contrast: 1. Large amount of stool throughout the left colon and rectum. No evidence of bowel obstruction. 2. Aortic Atherosclerosis   Assessment & Plan by Problem: Principal Problem:   Sepsis ([place]) Active Problems:   AKI (acute kidney injury) ([place])   Bradycardia  Summary: [name] [name] is a 86 year old male with a past medical history  of Parkinson's, pulmonary embolism, chronic systolic heart failure, sigmoid volvulus, hypertension, and bradycardia who presented to the ED with hypotension, bradycardia, and hypothermia now being admitted with probable sepsis likely from a urinary tract infection.   Sepsis: The patient presented to the ED with a clinical picture suspect for sepsis and was administered empiric Cefepime and Vancomycin. Given the patient's history source of infection is most likely a UTI, given his history of aspiration an aspiration pneumonia is also a possibility, but less likely given the clear xray, diverticulitis is another possibility given his large amount of stool and high pressure in his intestines this is also less likely given the fact that no diverticula were identified on CT.  - continue cefepime, tailor pending cultures  - continue IV fluids - telemetry - continue tylenol as needed - follow MAP  - follow lactate  - hold at home lisinopril    Bradycardia:  Previous hx of bradycardia, followed with Dr. [name]. Last follow up was in 2019. Initial EKG on presentation today showed possible junctional rhythm with PVCs that has now improved, continues to have PVCs though.   - Cardiology consulted by ED; we appreciate their recommendations  - BMP, magnesium lvl in the AM - TTE   Parkinson's, Alzheimer:  - bedside swallow  - continue at home carbidopa-levodopa - continue at home memantine  AKI: Patient has a creatinine of 0.96, but was previously 0.63 in December.  - monitor BUN and creatinine   HFmrEF: Last TTE in 2016 showed EF of 40-45%. No evidence of volume overload on examination.   - TTE - monitor weights - cardiology consulted; appreciate reccomendations  Constipation: Significant stool burden visible on CT imaging, despite daughter endorsing regular BMs.   - soap suds enema   PE:  - Warfarin per pharmacy   Dispo: Admit patient to  Inpatient with expected length of stay greater  than 2 midnights.  Signed: Mikael Spray, Medical Student 12/09/2019, 4:37 PM  Pager: [phone]   Attestation for Student Documentation:  I personally was present and performed or re-performed the history, physical exam and medical decision-making activities of this service and have verified that the service and findings are accurately documented in the student's note.   Assessment and plan has been edited appropriately.   Dr. Jose Persia Internal Medicine PGY-1  Pager: [phone] 11/24/2019, 8:38 PM

## 2019-11-26 NOTE — Progress Notes (Signed)
Provided ministry of presence for patient and daughter at bedside. Prayed at bedside per daughter's request. Will continue to be available for spiritual care as needed.  Rev. [place].

## 2019-11-26 NOTE — ED Triage Notes (Signed)
Pt arrives via ems from home after family noticed pt diaphoretic. Pt at baseline mentally per ems. Pt was recently started on lisinopril per family.  84 palp on scene, 124/68 after 100cc NS HR 42 Sinus Brady with frequent pvc RR 16 99% RA CBG 138 97.31F

## 2019-11-26 NOTE — Progress Notes (Signed)
ANTICOAGULATION CONSULT NOTE - Initial Consult  Pharmacy Consult for Warfarin Indication: PE hx   Allergies  Allergen Reactions  . Aricept [Donepezil Hcl] Other (See Comments)    Symptomatic Bradycardia.  . Shellfish Allergy Other (See Comments)    Causes gout Causes a flare up with Gout    Patient Measurements: Height: 5\' 8"  (172.7 cm) Weight: 149 lb 14.6 oz (68 kg) IBW/kg (Calculated) : 68.4 Heparin Dosing Weight:   Vital Signs: Temp: 97 F (36.1 C) (03/08 1131) Temp Source: Rectal (03/08 1131) BP: 106/61 (03/08 1330) Pulse Rate: 62 (03/08 1230)  Labs: Recent Labs    12/07/2019 1133 12/05/2019 1136 12/11/2019 1459  HGB 13.6 12.8*  --   HCT 40.0 41.5  --   PLT  --  208  --   LABPROT  --  26.2*  --   INR  --  2.4*  --   CREATININE 0.80 0.96  --   TROPONINIHS  --  18* 24*    Estimated Creatinine Clearance: 53.1 mL/min (by C-G formula based on SCr of 0.96 mg/dL).   Medical History: Past Medical History:  Diagnosis Date  . Anxiety   . Coronary artery disease, non-occlusive    with history of MI; Cath 2008 w multivessel nonobstructive CAD  . Dementia ([place])   . Depression   . GERD (gastroesophageal reflux disease)   . Hyperlipidemia   . Hypertension   . Memory loss   . Parkinson disease ([place])   . PE (pulmonary embolism)    unprovoked PE completed 6 months of warfarin, warfarin d/ced 02/12/2010, repeat PE 07/10/2010 after a long car ride and now on lifelong coumadin.  . Pituitary microadenoma ([place])    incidental finding CT 12/2009  . Prostate cancer ([place])   . Scoliosis   . Sigmoid volvulus ([place]) 08/02/2016  . Stroke ([place]) 03/2018  . Volvulus of colon ([employer]) 06/01/2017    Medications:  Scheduled:  . carbidopa-levodopa  1 tablet Oral QID  . cholecalciferol  400 Units Oral BID  . [START ON 11/27/2019] memantine  28 mg Oral Daily  . sodium chloride flush  3 mL Intravenous Q12H    Assessment: Patient is 35 yom that presented to the ED with bradycardia and  hypotension. Patient takes warfarin at home for a Hx of PE and pharmacy has been asked to continue his warfarin.   PTA regimen: Warfarin 2.5mg  TS, and 5mg  ROW  INR on Friday (3/5) was 4.1 has held warfarin since   Goal of Therapy:  INR 2-3 Monitor platelets by anticoagulation protocol: Yes   Plan:  - INR therapeutic at 2.4 after holding 2 days  - Will dose Warfarin 2.5 mg PO x 1 dose tonight  - Monitor patient for s/s of bleeding and daily INR   [name] [name] PharmD. BCPS  11/20/2019,4:16 PM

## 2019-11-26 NOTE — ED Notes (Signed)
Paged admitting MD regarding soap suds enema. Pt with moderate sized bowel movement, soft stool.

## 2019-11-27 ENCOUNTER — Inpatient Hospital Stay (HOSPITAL_COMMUNITY): Payer: Medicare HMO

## 2019-11-27 DIAGNOSIS — R001 Bradycardia, unspecified: Secondary | ICD-10-CM

## 2019-11-27 DIAGNOSIS — R4182 Altered mental status, unspecified: Secondary | ICD-10-CM

## 2019-11-27 DIAGNOSIS — I5021 Acute systolic (congestive) heart failure: Secondary | ICD-10-CM

## 2019-11-27 DIAGNOSIS — J9601 Acute respiratory failure with hypoxia: Secondary | ICD-10-CM

## 2019-11-27 DIAGNOSIS — F0280 Dementia in other diseases classified elsewhere without behavioral disturbance: Secondary | ICD-10-CM

## 2019-11-27 DIAGNOSIS — R6521 Severe sepsis with septic shock: Secondary | ICD-10-CM

## 2019-11-27 DIAGNOSIS — G20 Parkinson's disease: Secondary | ICD-10-CM

## 2019-11-27 DIAGNOSIS — I959 Hypotension, unspecified: Secondary | ICD-10-CM

## 2019-11-27 DIAGNOSIS — N179 Acute kidney failure, unspecified: Secondary | ICD-10-CM

## 2019-11-27 DIAGNOSIS — E872 Acidosis: Secondary | ICD-10-CM

## 2019-11-27 DIAGNOSIS — A419 Sepsis, unspecified organism: Principal | ICD-10-CM

## 2019-11-27 DIAGNOSIS — Z9911 Dependence on respirator [ventilator] status: Secondary | ICD-10-CM

## 2019-11-27 DIAGNOSIS — J9690 Respiratory failure, unspecified, unspecified whether with hypoxia or hypercapnia: Secondary | ICD-10-CM

## 2019-11-27 LAB — CBC
HCT: 43.6 % (ref 39.0–52.0)
HCT: 41.7 % (ref 39.0–52.0)
Hemoglobin: 14 g/dL (ref 13.0–17.0)
Hemoglobin: 13.7 g/dL (ref 13.0–17.0)
MCH: 31.3 pg (ref 26.0–34.0)
MCH: 31.1 pg (ref 26.0–34.0)
MCHC: 32.1 g/dL (ref 30.0–36.0)
MCHC: 32.9 g/dL (ref 30.0–36.0)
MCV: 97.3 fL (ref 80.0–100.0)
MCV: 94.8 fL (ref 80.0–100.0)
Platelets: 237 10*3/uL (ref 150–400)
Platelets: 220 10*3/uL (ref 150–400)
RBC: 4.48 MIL/uL (ref 4.22–5.81)
RBC: 4.4 MIL/uL (ref 4.22–5.81)
RDW: 15 % (ref 11.5–15.5)
RDW: 15.1 % (ref 11.5–15.5)
WBC: 6.2 10*3/uL (ref 4.0–10.5)
WBC: 5.1 10*3/uL (ref 4.0–10.5)
nRBC: 0 % (ref 0.0–0.2)
nRBC: 0 % (ref 0.0–0.2)

## 2019-11-27 LAB — BASIC METABOLIC PANEL
Anion gap: 16 — ABNORMAL HIGH (ref 5–15)
BUN: 32 mg/dL — ABNORMAL HIGH (ref 8–23)
CO2: 14 mmol/L — ABNORMAL LOW (ref 22–32)
Calcium: 9.4 mg/dL (ref 8.9–10.3)
Chloride: 110 mmol/L (ref 98–111)
Creatinine, Ser: 1.35 mg/dL — ABNORMAL HIGH (ref 0.61–1.24)
GFR calc Af Amer: 55 mL/min — ABNORMAL LOW (ref >60)
GFR calc non Af Amer: 47 mL/min — ABNORMAL LOW (ref >60)
Glucose, Bld: 145 mg/dL — ABNORMAL HIGH (ref 70–99)
Potassium: 4.6 mmol/L (ref 3.5–5.1)
Sodium: 140 mmol/L (ref 135–145)

## 2019-11-27 LAB — PROTIME-INR
INR: 3 — ABNORMAL HIGH (ref 0.8–1.2)
Prothrombin Time: 31.4 seconds — ABNORMAL HIGH (ref 11.4–15.2)

## 2019-11-27 LAB — POCT I-STAT 7, (LYTES, BLD GAS, ICA,H+H)
Acid-base deficit: 6 mmol/L — ABNORMAL HIGH (ref 0.0–2.0)
Acid-base deficit: 7 mmol/L — ABNORMAL HIGH (ref 0.0–2.0)
Bicarbonate: 15.8 mmol/L — ABNORMAL LOW (ref 20.0–28.0)
Bicarbonate: 16.4 mmol/L — ABNORMAL LOW (ref 20.0–28.0)
Calcium, Ion: 1.17 mmol/L (ref 1.15–1.40)
Calcium, Ion: 1.2 mmol/L (ref 1.15–1.40)
HCT: 38 % — ABNORMAL LOW (ref 39.0–52.0)
HCT: 42 % (ref 39.0–52.0)
Hemoglobin: 12.9 g/dL — ABNORMAL LOW (ref 13.0–17.0)
Hemoglobin: 14.3 g/dL (ref 13.0–17.0)
O2 Saturation: 100 %
O2 Saturation: 99 %
Patient temperature: 97.8
Patient temperature: 97.2
Potassium: 4.1 mmol/L (ref 3.5–5.1)
Potassium: 4.3 mmol/L (ref 3.5–5.1)
Sodium: 143 mmol/L (ref 135–145)
Sodium: 142 mmol/L (ref 135–145)
TCO2: 16 mmol/L — ABNORMAL LOW (ref 22–32)
TCO2: 17 mmol/L — ABNORMAL LOW (ref 22–32)
pCO2 arterial: 20.7 mmHg — ABNORMAL LOW (ref 32.0–48.0)
pCO2 arterial: 26.1 mmHg — ABNORMAL LOW (ref 32.0–48.0)
pH, Arterial: 7.49 — ABNORMAL HIGH (ref 7.350–7.450)
pH, Arterial: 7.403 (ref 7.350–7.450)
pO2, Arterial: 230 mmHg — ABNORMAL HIGH (ref 83.0–108.0)
pO2, Arterial: 133 mmHg — ABNORMAL HIGH (ref 83.0–108.0)

## 2019-11-27 LAB — COMPREHENSIVE METABOLIC PANEL
ALT: 11 U/L (ref 0–44)
AST: 39 U/L (ref 15–41)
Albumin: 2.5 g/dL — ABNORMAL LOW (ref 3.5–5.0)
Alkaline Phosphatase: 33 U/L — ABNORMAL LOW (ref 38–126)
Anion gap: 15 (ref 5–15)
BUN: 40 mg/dL — ABNORMAL HIGH (ref 8–23)
CO2: 15 mmol/L — ABNORMAL LOW (ref 22–32)
Calcium: 8.8 mg/dL — ABNORMAL LOW (ref 8.9–10.3)
Chloride: 113 mmol/L — ABNORMAL HIGH (ref 98–111)
Creatinine, Ser: 1.66 mg/dL — ABNORMAL HIGH (ref 0.61–1.24)
GFR calc Af Amer: 43 mL/min — ABNORMAL LOW (ref >60)
GFR calc non Af Amer: 37 mL/min — ABNORMAL LOW (ref >60)
Glucose, Bld: 97 mg/dL (ref 70–99)
Potassium: 4.4 mmol/L (ref 3.5–5.1)
Sodium: 143 mmol/L (ref 135–145)
Total Bilirubin: 1.7 mg/dL — ABNORMAL HIGH (ref 0.3–1.2)
Total Protein: 5.3 g/dL — ABNORMAL LOW (ref 6.5–8.1)

## 2019-11-27 LAB — GLUCOSE, CAPILLARY
Glucose-Capillary: 100 mg/dL — ABNORMAL HIGH (ref 70–99)
Glucose-Capillary: 46 mg/dL — ABNORMAL LOW (ref 70–99)
Glucose-Capillary: 62 mg/dL — ABNORMAL LOW (ref 70–99)
Glucose-Capillary: 62 mg/dL — ABNORMAL LOW (ref 70–99)

## 2019-11-27 LAB — MRSA PCR SCREENING: MRSA by PCR: NEGATIVE

## 2019-11-27 LAB — LACTIC ACID, PLASMA
Lactic Acid, Venous: 6.6 mmol/L (ref 0.5–1.9)
Lactic Acid, Venous: 5.9 mmol/L (ref 0.5–1.9)
Lactic Acid, Venous: 4.9 mmol/L (ref 0.5–1.9)

## 2019-11-27 LAB — ECHOCARDIOGRAM COMPLETE
Height: 68 in
Weight: 2398.6 oz

## 2019-11-27 LAB — TROPONIN I (HIGH SENSITIVITY)
Troponin I (High Sensitivity): 228 ng/L (ref <18)
Troponin I (High Sensitivity): 259 ng/L (ref <18)

## 2019-11-27 LAB — URINE CULTURE

## 2019-11-27 LAB — MAGNESIUM: Magnesium: 3.5 mg/dL — ABNORMAL HIGH (ref 1.7–2.4)

## 2019-11-27 LAB — PROCALCITONIN: Procalcitonin: >150 ng/mL

## 2019-11-27 MED ORDER — ETOMIDATE 2 MG/ML IV SOLN
20.0000 mg | Freq: Once | INTRAVENOUS | Status: AC
  Administered 2019-11-27: 09:00:00 20 mg via INTRAVENOUS

## 2019-11-27 MED ORDER — LACTATED RINGERS IV BOLUS: 500.0000 mL | Freq: Once | INTRAVENOUS | Status: DC

## 2019-11-27 MED ORDER — LACTATED RINGERS IV BOLUS
1000.0000 mL | Freq: Once | INTRAVENOUS | Status: AC
  Administered 2019-11-27: 06:00:00 1000 mL via INTRAVENOUS

## 2019-11-27 MED ORDER — ORAL CARE MOUTH RINSE
15.0000 mL | OROMUCOSAL | Status: DC
  Administered 2019-11-27 – 2019-11-30 (×30): 15 mL via OROMUCOSAL

## 2019-11-27 MED ORDER — DEXTROSE 50 % IV SOLN
25.0000 mL | Freq: Once | INTRAVENOUS | Status: AC
  Administered 2019-11-27: 25 mL via INTRAVENOUS

## 2019-11-27 MED ORDER — MIDAZOLAM HCL 2 MG/2ML IJ SOLN
INTRAMUSCULAR | Status: AC
  Filled 2019-11-27: qty 2

## 2019-11-27 MED ORDER — BISACODYL 10 MG RE SUPP: 10.0000 mg | Freq: Every day | RECTAL | Status: DC | PRN

## 2019-11-27 MED ORDER — ROCURONIUM BROMIDE 50 MG/5ML IV SOLN
50.0000 mg | Freq: Once | INTRAVENOUS | Status: AC
  Administered 2019-11-27: 09:00:00 50 mg via INTRAVENOUS
  Filled 2019-11-27: qty 5

## 2019-11-27 MED ORDER — POLYETHYLENE GLYCOL 3350 17 G PO PACK
17.0000 g | PACK | Freq: Every day | ORAL | Status: DC
  Filled 2019-11-27: qty 1

## 2019-11-27 MED ORDER — DEXTROSE 50 % IV SOLN
INTRAVENOUS | Status: AC
  Administered 2019-11-27: 25 mL
  Filled 2019-11-27: qty 50

## 2019-11-27 MED ORDER — NOREPINEPHRINE 16 MG/250ML-% IV SOLN
0.0000 ug/min | INTRAVENOUS | Status: DC
  Administered 2019-11-27: 21:00:00 27 ug/min via INTRAVENOUS
  Administered 2019-11-27: 10:00:00 10 ug/min via INTRAVENOUS
  Administered 2019-11-28: 26 ug/min via INTRAVENOUS
  Administered 2019-11-28: 08:00:00 28 ug/min via INTRAVENOUS
  Administered 2019-11-28: 18:00:00 26 ug/min via INTRAVENOUS
  Administered 2019-11-29: 08:00:00 22 ug/min via INTRAVENOUS
  Filled 2019-11-27 (×4): qty 250

## 2019-11-27 MED ORDER — CHLORHEXIDINE GLUCONATE CLOTH 2 % EX PADS
6.0000 | MEDICATED_PAD | Freq: Every day | CUTANEOUS | Status: DC
  Administered 2019-11-27 – 2019-11-30 (×4): 6 via TOPICAL

## 2019-11-27 MED ORDER — DEXTROSE 50 % IV SOLN
INTRAVENOUS | Status: AC
  Filled 2019-11-27: qty 50

## 2019-11-27 MED ORDER — FENTANYL CITRATE (PF) 100 MCG/2ML IJ SOLN
25.0000 ug | INTRAMUSCULAR | Status: DC | PRN
  Administered 2019-11-29 – 2019-11-30 (×6): 100 ug via INTRAVENOUS
  Filled 2019-11-27 (×6): qty 2

## 2019-11-27 MED ORDER — SODIUM CHLORIDE 0.9% FLUSH
10.0000 mL | Freq: Two times a day (BID) | INTRAVENOUS | Status: DC
  Administered 2019-11-27 – 2019-11-30 (×6): 10 mL

## 2019-11-27 MED ORDER — MEMANTINE HCL 10 MG PO TABS
10.0000 mg | ORAL_TABLET | Freq: Two times a day (BID) | ORAL | Status: DC
  Administered 2019-11-27 – 2019-11-30 (×6): 10 mg
  Filled 2019-11-27 (×7): qty 1

## 2019-11-27 MED ORDER — DOCUSATE SODIUM 50 MG/5ML PO LIQD: 100.0000 mg | Freq: Two times a day (BID) | ORAL | Status: DC | PRN

## 2019-11-27 MED ORDER — SODIUM CHLORIDE 0.9% FLUSH: 10.0000 mL | INTRAVENOUS | Status: DC | PRN

## 2019-11-27 MED ORDER — CHLORHEXIDINE GLUCONATE 0.12% ORAL RINSE (MEDLINE KIT)
15.0000 mL | Freq: Two times a day (BID) | OROMUCOSAL | Status: DC
  Administered 2019-11-27 – 2019-11-30 (×7): 15 mL via OROMUCOSAL

## 2019-11-27 MED ORDER — CHOLECALCIFEROL 10 MCG (400 UNIT) PO TABS
400.0000 [IU] | ORAL_TABLET | Freq: Two times a day (BID) | ORAL | Status: DC
  Administered 2019-11-27 – 2019-11-29 (×5): 400 [IU]
  Filled 2019-11-27 (×5): qty 1

## 2019-11-27 MED ORDER — FENTANYL CITRATE (PF) 100 MCG/2ML IJ SOLN
25.0000 ug | INTRAMUSCULAR | Status: AC | PRN
  Administered 2019-11-27 – 2019-11-30 (×3): 25 ug via INTRAVENOUS
  Filled 2019-11-27 (×3): qty 2

## 2019-11-27 MED ORDER — FENTANYL CITRATE (PF) 100 MCG/2ML IJ SOLN
INTRAMUSCULAR | Status: AC
  Filled 2019-11-27: qty 2

## 2019-11-27 MED FILL — Medication: Qty: 1 | Status: AC

## 2019-11-27 NOTE — Progress Notes (Signed)
Chaplain engaged in initial visit with [name] [name] and family and will continue to offer support.   Chaplain available as needed.

## 2019-11-27 NOTE — Procedures (Signed)
Intubation Procedure Note [name] [name] [id number] [birth year]  Procedure: Intubation Indications: Respiratory insufficiency  Procedure Details Consent: Unable to obtain consent because of emergent medical necessity. Time Out: Verified patient identification, verified procedure, site/side was marked, verified correct patient position, special equipment/implants available, medications/allergies/relevent history reviewed, required imaging and test results available.  Performed  MAC and 4 Medications:  Fentanyl  Etomidate 20 mg Versed NMB rocuronium 108m    Evaluation Hemodynamic Status: Transient hypotension treated with pressors and fluid; O2 sats: currently acceptable Patient's Current Condition: unstable Complications: No apparent complications Patient did tolerate procedure well. Chest X-ray ordered to verify placement.  CXR: pending.   SRichardson LandryMinor ACNP LMaryanna ShapePCCM Pager [phone] 3 pm If no answer page [phone]/05/2020, 9:11 AM

## 2019-11-27 NOTE — Consult Note (Signed)
PULMONARY / CRITICAL CARE MEDICINE   NAME:  Graham Wysong, MRN:  [id number], DOB:  1931-02-04, LOS: 1 ADMISSION DATE:  11/24/2019, CONSULTATION DATE: 11/27/2019 REFERRING MD: Teaching service, CHIEF COMPLAINT: Failure to thrive acute respiratory distress metabolic acidosis  BRIEF HISTORY:    86 year old with end-stage dementia who with worsening metabolic acidosis respiratory distress intubated and transferred to intensive care unit.  Family is requesting full code of the very quickly approaching futile care. HISTORY OF PRESENT ILLNESS   86 year old male with a plethora of health issues that include to Alzheimer's disease with end-stage dementia, hypertension, prostate cancer, PE on Coumadin, chronic systolic heart failure, coronary artery disease, normocytic anemia, Parkinson's disease, diverticulosis, sores on buttocks, recurrent UTIs, hemorrhagic stroke with presumed sepsis plus or minus cardiogenic shock. 11/27/2019 pulmonary critical care called to bedside by teaching service.  Patient was noted to be in acute respiratory distress and was intermittently hypotensive.  Lactic acid is noted to be greater than 6.  Abdominal film from previous day revealed large stool burden.  Ongoing discussions with family concerning CODE STATUS with family requesting full CODE STATUS. The patient was transferred to the intensive care unit and was urgently intubated and central line placed.  He was unable to protect his airway.  At this time he remains a full code.  Again he has a plethora of health issues that will impede his recovery. SIGNIFICANT PAST MEDICAL HISTORY   End-stage stage dementia and Alzheimer's disease Coronary artery disease Pulmonary embolism Congestive heart failure Frequent urinary tract infection Generalized failure to thrive  SIGNIFICANT EVENTS:  11/27/1998 21-near code but did not lose pulse required urgent intubation STUDIES:   11/27/2018 one 2D echo>> CULTURES:  11/27/2019 sputum  culture>>  ANTIBIOTICS:  12/16/2019 cefepime>>  LINES/TUBES:   11/27/2019 endotracheal tube>> 11/27/2019 right femoral A-line>>  11/27/2019 right femoral CVL>>  CONSULTANTS:  11/27/2019 pulmonary critical care SUBJECTIVE:  86 year old male poorly responsive in acute respiratory distress requiring intubation  CONSTITUTIONAL: BP 108/76   Pulse 98   Temp 97.9 F (36.6 C) (Axillary)   Resp (!) 25   Ht 5\' 8"  (1.727 m)   Wt 68 kg   SpO2 97%   BMI 22.79 kg/m   I/O last 3 completed shifts: In: 600 [IV Piggyback:600] Out: -      Vent Mode: PRVC FiO2 (%):  [100 %] 100 % Set Rate:  [25 bmp] 25 bmp Vt Set:  [540 mL] 540 mL PEEP:  [5 cmH20] 5 cmH20 Plateau Pressure:  [18 cmH20] 18 cmH20  PHYSICAL EXAM: General: 86 year old male in obvious respiratory distress and required urgent intubation. Neuro: Poorly responsive but does arouse to painful stimuli. HEENT: No JVD or lymphadenopathy is appreciated poor dentition. Cardiovascular: Heart sounds are distant Lungs: Diminished throughout with coarse rhonchi Abdomen: Tense and painful Musculoskeletal: Grossly intact Skin: Cool and dry  RESOLVED PROBLEM LIST   ASSESSMENT AND PLAN   Vent dependent respiratory failure in the setting of suspected aspiration, possible heart failure, possible cardiogenic shock. Transfer the intensive care unit Urgent intubation BAL Vent bundle  History of coronary disease heart failure suspected cardiogenic component of hemodynamic instability shock 2D echo Cardiology involved in care Vasopressors as needed Suspected hypovolemic state therefore fluid boluses Place arterial line  Acute renal insufficiency Lab Results  Component Value Date   CREATININE 1.35 (H) 11/27/2019   CREATININE 0.96 12/04/2019   CREATININE 0.80 11/22/2019   CREATININE 0.96 12/23/2014   CREATININE 1.19 03/08/2013   CREATININE 1.12 01/19/2013   Monitor creatinine Avoid  nephrotoxins Maintain adequate blood  supply  Metabolic acidosis with PCO2 being low down to 21 with normal pH Monitor lactic acid Check arterial blood gas Status post 2 Amps of bicarb Questionable if there is abdominal process leading to the metabolic acidosis consider CT of the abdomen  End-stage dementia with Alzheimer's and Parkinson's disease Careful with sedation Hold antiparkinsonian drugs x1 day Ongoing discussions with family concerning CODE STATUS  History of pulmonary embolism on Coumadin with INR 2.539 2021 Monitor INR Hold anticoagulation after further evaluation of neurological status  Tense painful abdomen with abdominal film 12/06/2019 with large stool burden. Consider CT of abdomen Continue n.p.o. Continue gastric tube for decompression Hold off on tube feedings at this time     SUMMARY OF TODAY'S PLAN:  86 year old male with a plethora of health issues that include to Alzheimer's disease with end-stage dementia, hypertension, prostate cancer, PE on Coumadin, chronic systolic heart failure, coronary artery disease, normocytic anemia, Parkinson's disease, diverticulosis, sores on buttocks, recurrent UTIs, hemorrhagic stroke with presumed sepsis plus or minus cardiogenic shock. 11/27/2019 pulmonary critical care called to bedside by teaching service.  Patient was noted to be in acute respiratory distress and was intermittently hypotensive.  Lactic acid is noted to be greater than 6.  Abdominal film from previous day revealed large stool burden.  Ongoing discussions with family concerning CODE STATUS with family requesting full CODE STATUS. The patient was transferred to the intensive care unit and was urgently intubated and central line placed.  He was unable to protect his airway.  At this time he remains a full code.  Again he has a plethora of health issues that will impede his recovery.  Best Practice / Goals of Care / Disposition.   DVT PROPHYLAXIS: 11/27/2019 currently on Coumadin with an INR of 2.4.  On  Coumadin for presumed history of PE.  Further discussion of anticoagulation in near future following evaluation for altered mental status. SUP: PPI NUTRITION: We will need tube feedings MOBILITY: Bedrest GOALS OF CARE: Full required despite end-stage Alzheimer's disease dementia per request of daughter FAMILY DISCUSSIONS: Internal medicine teaching service is been having ongoing discussions into [place] currently full code DISPOSITION intensive care unit  LABS  Glucose No results for input(s): GLUCAP in the last 168 hours.  BMET Recent Labs  Lab 11/29/2019 1133 11/25/2019 1136 11/27/19 0308  NA 140 142 140  K 3.4* 3.6 4.6  CL 107 108 110  CO2  --  23 14*  BUN 13 12 32*  CREATININE 0.80 0.96 1.35*  GLUCOSE 132* 141* 145*    Liver Enzymes Recent Labs  Lab 12/12/2019 1136  AST 32  ALT 10  ALKPHOS 42  BILITOT 1.0  ALBUMIN 3.2*    Electrolytes Recent Labs  Lab 12/09/2019 1136 11/27/19 0308  CALCIUM 9.6 9.4  MG  --  3.5*    CBC Recent Labs  Lab 12/07/2019 1133 12/06/2019 1136 11/27/19 0308  WBC  --  8.7 6.2  HGB 13.6 12.8* 14.0  HCT 40.0 41.5 43.6  PLT  --  208 237    ABG No results for input(s): PHART, PCO2ART, PO2ART in the last 168 hours.  Coag's Recent Labs  Lab 11/28/2019 1136  INR 2.4*    Sepsis Markers Recent Labs  Lab 12/05/2019 2044 11/27/19 0525 11/27/19 0755  LATICACIDVEN 6.3* 6.6* 5.9*    Cardiac Enzymes No results for input(s): TROPONINI, PROBNP in the last 168 hours.  PAST MEDICAL HISTORY :   He  has a  past medical history of Anxiety, Coronary artery disease, non-occlusive, Dementia ([place]), Depression, GERD (gastroesophageal reflux disease), Hyperlipidemia, Hypertension, Memory loss, Parkinson disease ([place]), PE (pulmonary embolism), Pituitary microadenoma ([place]), Prostate cancer ([place]), Scoliosis, Sigmoid volvulus ([place]) (08/02/2016), Stroke ([place]) (03/2018), and Volvulus of colon ([place]) (06/01/2017).  PAST SURGICAL HISTORY:  He  has a past  surgical history that includes Prostatectomy; left heart catheterization with coronary angiogram (N/A, 12/10/2014); loop recorder implant (N/A, 12/16/2014); Flexible sigmoidoscopy (N/A, 08/02/2016); Flexible sigmoidoscopy (N/A, 08/06/2016); Colonoscopy (N/A, 05/30/2017); Cataract extraction; Flexible sigmoidoscopy (Left, 06/02/2017); Flexible sigmoidoscopy (N/A, 06/07/2017); and Partial colectomy (N/A, 06/08/2017).  Allergies  Allergen Reactions  . Aricept [Donepezil Hcl] Other (See Comments)    Symptomatic Bradycardia.  . Shellfish Allergy Other (See Comments)    Causes gout Causes a flare up with Gout    No current facility-administered medications on file prior to encounter.   Current Outpatient Medications on File Prior to Encounter  Medication Sig  . acetaminophen (TYLENOL) 500 MG tablet Take 1,000 mg by mouth 2 (two) times daily.   . Ca Phosphate-Cholecalciferol (CALCIUM/VITAMIN D3 GUMMIES) 250-350 MG-UNIT CHEW Chew 1 Dose by mouth 2 (two) times daily.  . carbidopa-levodopa (SINEMET IR) 25-100 MG tablet Take 1 tablet by mouth 4 (four) times daily.  . cetirizine (ZYRTEC) 5 MG chewable tablet Chew 5 mg by mouth daily.  [name] lisinopril (ZESTRIL) 5 MG tablet Take 1 tablet (5 mg total) by mouth daily.  . memantine (NAMENDA XR) 28 MG CP24 24 hr capsule TAKE 1 CAPSULE (28 MG TOTAL) BY MOUTH DAILY.  . Multiple Vitamins-Minerals (MULTIVITAMIN WITH MINERALS) tablet Take 1 tablet by mouth daily.   [name] omega-3 acid ethyl esters (LOVAZA) 1 g capsule Take 1 g by mouth daily.  [name] omeprazole (PRILOSEC) 10 MG capsule Take 10 mg by mouth daily as needed (for heartburn). OTC   . polyethylene glycol (MIRALAX / GLYCOLAX) packet Take 17 g by mouth daily as needed (constipation).  . potassium chloride SA (KLOR-CON M20) 20 MEQ tablet Take 2 tablets (40 mEq total) by mouth daily.  [name] senna (SENOKOT) 8.6 MG TABS tablet Take 1 tablet (8.6 mg total) by mouth daily as needed for mild constipation.  [name] warfarin (COUMADIN) 5 MG  tablet TAKE 1/2 TABLET BY MOUTH EVERY THU, AND SAT AND 1 TABLET BY MOUTH ALL OTHER DAYS (Patient taking differently: Take 2.5-5 mg by mouth daily. TAKE 1/2 TABLET BY MOUTH EVERY THU, AND SAT AND 1 TABLET BY MOUTH ALL OTHER DAYS)  . triamcinolone cream (KENALOG) 0.5 % Apply 1 application topically 3 (three) times daily. (Patient not taking: Reported on 12/13/2019)    FAMILY HISTORY:   His family history includes Cervical cancer in an other family member; Dementia in his mother; Hypertension in his child and father; Lung cancer in an other family member; Stroke in an other family member. There is no history of Heart attack.  SOCIAL HISTORY:  He  reports that he has never smoked. He has never used smokeless tobacco. He reports that he does not drink alcohol or use drugs.  REVIEW OF SYSTEMS:    Na   App cct 20 min   Richardson Landry Minor ACNP Acute Care Nurse Practitioner [place] Please consult Amion 11/27/2019, 9:28 AM

## 2019-11-27 NOTE — Progress Notes (Signed)
  Echocardiogram 2D Echocardiogram has been performed.  [name] [name] 11/27/2019, 10:01 AM

## 2019-11-27 NOTE — Progress Notes (Signed)
Hypoglycemic Event  CBG: 62 arterial line sample   Treatment: dextrose 50% 53ml injection  Follow-up CBG: Time: 0027 CBG Result: 147   Comments/MD notified: Elink; see new orders   Darla Lesches

## 2019-11-27 NOTE — Significant Event (Signed)
Rapid Response Event Note  Overview: Sepsis - Hypotensive/Neurologic - Unresponsive/Respiratory - Failure  Initial Focused Assessment: Called to the bedside by nurse with concern of patient's BP being low and patient's poor mental status coupled with increased RR. Upon arrival, patient was essentially unresponsive, SBP in the 60s, profusely diaphoretic/clammy/mottling, HR - ST 100s, RR in the 40s, increased accessory muscle use. Pulse was quite weak and thready but patient was spontaneously breathing. Abdomen appears distended and seems tight, no bowel sounds were heard, lung sounds rhonchi throughout, very diminished in the bases. Veins flat, oral mucosa dry, appears dry. IMTS MDs came to the bedside immediately and PCCM was consulted. Code Blue was called for impending respiratory failure. Patient was still spontaneously breathing, placed on NRB 15L and taken emergently to 2H15.   Interventions: -- LR Bolus 500 cc started -- Bicarb 2 amps -- Levo 10 mcg/kg/min started peripherally.  -- STAT ABG was ordered but Code Blue was called so it was not collected  Plan of Care: Once in the ICU, patient was re-accessed peripherally, underwent RSI successfully, central venous line placed, and femoral arterial line were placed in 2H15.   Event Summary:  Call Time 0742 Arrival Time 0745 End Time 0915   Keiandra Sullenger R

## 2019-11-27 NOTE — Procedures (Signed)
Arterial Catheter Insertion Procedure Note [name] [name] [id number] Jan 24, 1931  Procedure: Insertion of Arterial Catheter  Indications: Blood pressure monitoring  Procedure Details Consent: Unable to obtain consent because of emergent medical necessity. Time Out: Verified patient identification, verified procedure, site/side was marked, verified correct patient position, special equipment/implants available, medications/allergies/relevent history reviewed, required imaging and test results available.  Performed  Maximum sterile technique was used including antiseptics, cap, gloves, gown, hand hygiene, mask and sheet. Skin prep: Chlorhexidine; local anesthetic administered 20 gauge catheter was inserted into left femoral artery using the Seldinger technique. ULTRASOUND GUIDANCE USED: YES Evaluation Blood flow good; BP tracing good. Complications: No apparent complications.   Spero Geralds 11/27/2019

## 2019-11-27 NOTE — Progress Notes (Signed)
Hypoglycemic Event  CBG: 66  Treatment: dextrose 50% injection 28ml   Follow-up CBG: Time: 2045 CBG Result:100   Comments/MD notified: Elink MD notified, new orders given    Darla Lesches

## 2019-11-27 NOTE — Progress Notes (Addendum)
Internal Medicine Attending Note:  Team was called to bedside early this morning for worsening hypotension and AMS. On arrival patient was unresponsive, tachypneic with intermittent desaturations to the 60s, and hypotensive with BP unreadable at times. Pulse was weak but palpable. He was admitted for septic shock, unclear source but possible UTI, unresponsive to >3L IVFs and IV cefepime thus far. Lactic acid up to 6.6 this morning.   PCCM was paged and code blue activated due to need for emergent airway and imminent arrest. Patient was started on levophed with improvement in his BP. Oxygenation improved to 100% with high flow facemask but patient remained unresponsive. He was transferred to [facility] to be intubated. Appreciate PCCM assistance.   Patient is a clinic patient of ours, well known to the teaching service. He has advanced dementia. Our service has had multiple [place] conversations with the daughter both during past and current admission. She continues to insist on aggressive full scope of care.    Velna Ochs, MD 11/27/2019, 8:28 AM

## 2019-11-27 NOTE — Progress Notes (Signed)
Presented to bedside to evaluate patient. Lactic acid has climbed and is now > 6.0. He is arousable with a BP of 96/65 but diaphoretic. Has a history of CHF but appears to be more in distributive shock. Give another 1L bolus and repeat lactic acid. May need escalation in care if BP does not respond and lactic acidosis continues to worsen.   Ina Homes, MD  IMTS PGY3

## 2019-11-27 NOTE — Procedures (Signed)
Central Venous Catheter Insertion Procedure Note Tirth Mask [id number] 1930/10/09  Procedure: Insertion of Central Venous Catheter Indications: Drug and/or fluid administration  Procedure Details Consent: Unable to obtain consent because of emergent medical necessity. Time Out: Verified patient identification, verified procedure, site/side was marked, verified correct patient position, special equipment/implants available, medications/allergies/relevent history reviewed, required imaging and test results available.  Performed  Maximum sterile technique was used including antiseptics, cap, gloves, gown, hand hygiene, mask and sheet. Skin prep: Chlorhexidine; local anesthetic administered A antimicrobial bonded/coated triple lumen catheter was placed in the left femoral vein due to emergent situation using the Seldinger technique. Ultrasound guidance was used to confirm wire placement.  Evaluation Blood flow good Complications: No apparent complications Patient did tolerate procedure well.   Spero Geralds 11/27/2019, 9:25 AM

## 2019-11-27 NOTE — Progress Notes (Signed)
   11/27/19 0820  Clinical Encounter Type  Visited With Family  Visit Type Code  Referral From Nurse  Consult/Referral To Chaplain  Spiritual Encounters  Spiritual Needs Prayer  Stress Factors  Patient Stress Factors Health changes  Family Stress Factors Major life changes   Chaplain responded to relieve night time Chaplain at change of shift for code blue. [name], Rev. Veasna's daughter was in his room on 6E collecting her things for the transition down to [place]. [name] was very emotional as she has been taking care of her father and felt that she should have done more. Chaplain assured [name] that she has given her father the best care possible. AC Ron reminded [name] that her father's body is tired. We let the family know the medical team is doing all in their power to keep Rev. Taytum alive, however, if Rev. Adonnis and God decide it is his time there is nothing medicine can do to keep Rev. Pelham here with [name]. Chaplain held preyer with [name] and assisted her in moving into the consult space on 2H. Chaplain provided water and tissues. Chaplain provided ministry of presence and encouragement. Chaplains remain available for support as needs arise.   Chaplain Resident, Evelene Croon, M Div [phone] on-call pager

## 2019-11-27 NOTE — Evaluation (Signed)
PT Cancellation Note  Patient Details Name: [name] [name] MRN: [id number] DOB: Aug 22, 1931   Cancelled Treatment:    Reason Eval/Treat Not Completed: Medical issues which prohibited therapy. Orders received and chart reviewed. Pt had rapid decline in function this am, with reported worsening hypotension and AMS, pt became unresponsive, tachyphemic with desat to 60s. Code blue was called pt was transfered to ICU and intubated. Will hold off on PT Eval this day and attempt to f/u tomorrow should pt show improvement.   Horald Chestnut, PT   Delford Field 11/27/2019, 2:48 PM

## 2019-11-27 NOTE — Progress Notes (Signed)
ETT tube pulled back 2cm per MD order. ETT tube is now at 24 at the lip.

## 2019-11-27 NOTE — Progress Notes (Signed)
Progress Note  Patient Name: [name] [name] Date of Encounter: 11/27/2019  Primary Cardiologist: Ena Dawley, MD   Subjective   The patient became hypotensive this am, transferred to CCU.  Inpatient Medications    Scheduled Meds: . carbidopa-levodopa  1 tablet Per Tube QID  . cholecalciferol  400 Units Oral BID  . memantine  28 mg Oral Daily  . sodium chloride flush  3 mL Intravenous Q12H  . warfarin  2.5 mg Oral ONCE-1800  . Warfarin - Pharmacist Dosing Inpatient   Does not apply q1800   Continuous Infusions: . ceFEPime (MAXIPIME) IV 2 g (11/27/19 0131)   PRN Meds: acetaminophen **OR** acetaminophen   Vital Signs    Vitals:   11/27/19 0134 11/27/19 0541 11/27/19 0619 11/27/19 0700  BP:  101/73    Pulse: 93 92 92 100  Resp: (!) 26 (!) 31 (!) 25 (!) 34  Temp:      TempSrc:      SpO2: 96% 95% 95% 97%  Weight:      Height:        Intake/Output Summary (Last 24 hours) at 11/27/2019 [id number] Last data filed at 11/25/2019 1948 Gross per 24 hour  Intake 600 ml  Output --  Net 600 ml   Last 3 Weights 11/30/2019 11/20/2019 10/18/2019  Weight (lbs) 149 lb 14.6 oz (No Data) (No Data)  Weight (kg) 68 kg (No Data) (No Data)     Telemetry    Sinus rhythm to sinus tachycardia- Personally Reviewed  ECG    No new tracing- Personally Reviewed  Physical Exam   GEN:  Nonarousable, hypotensive.   Neck: No JVD Cardiac: RRR, no murmurs, rubs, or gallops.  Respiratory: Clear to auscultation bilaterally. GI: Soft, nontender, non-distended  MS: Cold extremities not palpable pulses    Labs    High Sensitivity Troponin:   Recent Labs  Lab 11/28/2019 1136 11/23/2019 1459  TROPONINIHS 18* 24*     Chemistry Recent Labs  Lab 12/18/2019 1133 12/03/2019 1136 11/27/19 0308  NA 140 142 140  K 3.4* 3.6 4.6  CL 107 108 110  CO2  --  23 14*  GLUCOSE 132* 141* 145*  BUN 13 12 32*  CREATININE 0.80 0.96 1.35*  CALCIUM  --  9.6 9.4  PROT  --  6.5  --   ALBUMIN  --  3.2*  --   AST   --  32  --   ALT  --  10  --   ALKPHOS  --  42  --   BILITOT  --  1.0  --   GFRNONAA  --  >60 47*  GFRAA  --  >60 55*  ANIONGAP  --  11 16*    Hematology Recent Labs  Lab 11/24/2019 1133 11/30/2019 1136 11/27/19 0308  WBC  --  8.7 6.2  RBC  --  4.11* 4.48  HGB 13.6 12.8* 14.0  HCT 40.0 41.5 43.6  MCV  --  101.0* 97.3  MCH  --  31.1 31.3  MCHC  --  30.8 32.1  RDW  --  14.9 15.0  PLT  --  208 237   BNPNo results for input(s): BNP, PROBNP in the last 168 hours.   DDimer No results for input(s): DDIMER in the last 168 hours.   Radiology    CT ABDOMEN PELVIS WO CONTRAST  Result Date: 12/11/2019 CLINICAL DATA:  Abdominal distension. Peritonitis or perforation suspected. EXAM: CT ABDOMEN AND PELVIS WITHOUT CONTRAST TECHNIQUE: Multidetector CT imaging  of the abdomen and pelvis was performed following the standard protocol without IV contrast. COMPARISON:  03/13/2019 FINDINGS: Lower chest: Left hemidiaphragm elevation with left greater than right lower lobe atelectasis. No pleural effusion. LAD and right coronary artery atherosclerosis. Normal heart size. Hepatobiliary: No focal liver abnormality is seen. No gallstones, gallbladder wall thickening, or biliary dilatation. Pancreas: Unremarkable. Spleen: Unremarkable. Adrenals/Urinary Tract: Unremarkable adrenal glands. No renal calculi or hydronephrosis. Unchanged 4.2 cm right lower pole renal cyst. Nondistended bladder which is displaced anteriorly by rectal stool. Stomach/Bowel: Sequelae of sigmoid colectomy are again identified. There is a large amount of stool extending from the splenic flexure to the rectum, much greater than on the prior study. The rectum is distended to 8.5 cm in diameter. A small amount of stool is present in the more proximal colon, and there is no evidence of bowel obstruction. No gross inflammation is identified. Vascular/Lymphatic: Mild abdominal aortic atherosclerosis without aneurysm. No enlarged lymph nodes.  Reproductive: Status post prostatectomy. Other: No ascites or pneumoperitoneum. Musculoskeletal: No acute osseous abnormality or suspicious osseous lesion. Moderate lumbar dextroscoliosis. Severe asymmetric left-sided disc space narrowing at L2-3 and L3-4 with interbody ankylosis. IMPRESSION: 1. Large amount of stool throughout the left colon and rectum. No evidence of bowel obstruction. 2. Aortic Atherosclerosis (ICD10-I70.0). Electronically Signed   By: Logan Bores M.D.   On: 11/30/2019 14:18   CT Head Wo Contrast  Result Date: 12/16/2019 CLINICAL DATA:  Altered mental status. EXAM: CT HEAD WITHOUT CONTRAST TECHNIQUE: Contiguous axial images were obtained from the base of the skull through the vertex without intravenous contrast. COMPARISON:  None. FINDINGS: Brain: No evidence of acute infarction, hemorrhage, hydrocephalus, extra-axial collection or mass effect. 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 There is extensive confluent periventricular white matter hypodensity consistent with chronic small vessel ischemic change. Diffuse atrophy. Vascular: Calcified atherosclerosis. No hyperdense vessel. Skull: Normal. Negative for fracture or focal lesion. Sinuses/Orbits: No acute finding. Other: None. IMPRESSION: 1. No acute intracranial findings. 2. 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 3. Extensive chronic small vessel ischemic change. Electronically Signed   By: Audie Pinto M.D.   On: 11/30/2019 12:32   DG Chest Port 1 View  Result Date: 11/29/2019 CLINICAL DATA:  Weakness, bradycardia EXAM: PORTABLE CHEST 1 VIEW COMPARISON:  09/02/2019 chest radiograph. FINDINGS: Chronic prominent elevation of the left hemidiaphragm. Stable cardiomediastinal silhouette with normal heart size. No pneumothorax. No pleural effusion. Mild left lung base atelectasis. No pulmonary edema. No acute consolidative airspace disease. Loop recorder overlies the left lower chest.  IMPRESSION: Chronic prominent elevation of the left hemidiaphragm with mild left lung base atelectasis. No acute cardiopulmonary disease. Electronically Signed   By: Ilona Sorrel M.D.   On: 12/09/2019 12:07   DG Abd Portable 1 View  Result Date: 12/18/2019 CLINICAL DATA:  NG tube placement EXAM: PORTABLE ABDOMEN - 1 VIEW COMPARISON:  08/14/2019 FINDINGS: The enteric tube projects over the gastric body. The tip is pointed distally. There is a large amount of stool throughout the colon. The bowel gas pattern is nonspecific with some mild gaseous distention of loops of colon and small bowel scattered throughout the abdomen. IMPRESSION: The enteric tube projects over the gastric body. Large stool burden again noted. Electronically Signed   By: Constance Holster M.D.   On: 12/07/2019 22:30   Cardiac Studies     Patient Profile     86 y.o. male with a hx of CAD ('08 with multivessel  nonobstructive disease), HTN, HL, Dementia, GERD, Parkinson disease, Prostate Ca, recurrent PE on chronic coumadin and anxiety who is being seen today for the evaluation of bradycardia at the request of Dr. Jeanell Sparrow. The patient has known dementia for years and poor baseline functional capacity, he does not walk and his daughter has to move him in and out of wheelchair.  He also has history of bradycardia several years ago, he has a loop recorder that did not show any significant pauses just sinus bradycardia and PVCs.  Assessment & Plan    1. Bradycardia 2. Sepsis 3. HTN 4. Dementia 5. Hx of PE  On presentation yesterday he was in junctional rhythm with ventricular bigeminy, this has resolved and he is currently in sinus rhythm with occasional PVCs.  EKG showed sinus rhythm right bundle branch block. He was started on ceftriaxone for presumed urinary tract infection with sepsis. His lactic acid increased from 3.5-6.6, he became hypotensive and unresponsive this morning, he was transferred to ICU where he is being  volume resuscitated, antibiotics are being continued, he is being started on pressors, followed by ICU team. His telemetry shows sinus rhythm to sinus tachycardia.  He appears dry in his shock seems to be purely septic not cardiogenic. His potassium has been replaced today at 4.6.  His magnesium is normal.  We will sign off.  Please call us with any questions.  For questions or updates, please contact [place] Please consult [email or web address] for contact info under     Signed, Ena Dawley, MD  11/27/2019, 8:33 AM

## 2019-11-27 NOTE — Progress Notes (Signed)
eLink Physician-Brief Progress Note Patient Name: [name] [name] DOB: 23-Feb-1931 MRN: [id number]   Date of Service  11/27/2019  HPI/Events of Note  Pt with blood sugar of 62 mg %  For which he got D50 W  To bring it up to 100 mg %, bedside RN requesting order for CBG monitoring.  eICU Interventions  CBG Q 4 hours ordered.        Kerry Kass Leman Martinek 11/27/2019, 10:08 PM

## 2019-11-28 ENCOUNTER — Inpatient Hospital Stay (HOSPITAL_COMMUNITY): Payer: Medicare HMO

## 2019-11-28 DIAGNOSIS — R579 Shock, unspecified: Secondary | ICD-10-CM

## 2019-11-28 DIAGNOSIS — G9340 Encephalopathy, unspecified: Secondary | ICD-10-CM

## 2019-11-28 DIAGNOSIS — J9600 Acute respiratory failure, unspecified whether with hypoxia or hypercapnia: Secondary | ICD-10-CM

## 2019-11-28 LAB — CBC WITH DIFFERENTIAL/PLATELET
Abs Immature Granulocytes: 0 10*3/uL (ref 0.00–0.07)
Basophils Absolute: 0 10*3/uL (ref 0.0–0.1)
Basophils Relative: 0 %
Eosinophils Absolute: 0 10*3/uL (ref 0.0–0.5)
Eosinophils Relative: 0 %
HCT: 43.7 % (ref 39.0–52.0)
Hemoglobin: 14.4 g/dL (ref 13.0–17.0)
Lymphocytes Relative: 24 %
Lymphs Abs: 1.5 10*3/uL (ref 0.7–4.0)
MCH: 31 pg (ref 26.0–34.0)
MCHC: 33 g/dL (ref 30.0–36.0)
MCV: 94.2 fL (ref 80.0–100.0)
Monocytes Absolute: 0.6 10*3/uL (ref 0.1–1.0)
Monocytes Relative: 10 %
Neutro Abs: 4.2 10*3/uL (ref 1.7–7.7)
Neutrophils Relative %: 66 %
Platelets: 211 10*3/uL (ref 150–400)
RBC: 4.64 MIL/uL (ref 4.22–5.81)
RDW: 15.5 % (ref 11.5–15.5)
WBC: 6.3 10*3/uL (ref 4.0–10.5)
nRBC: 0 % (ref 0.0–0.2)
nRBC: 0 /100 WBC

## 2019-11-28 LAB — BASIC METABOLIC PANEL
Anion gap: 15 (ref 5–15)
BUN: 56 mg/dL — ABNORMAL HIGH (ref 8–23)
CO2: 16 mmol/L — ABNORMAL LOW (ref 22–32)
Calcium: 8.7 mg/dL — ABNORMAL LOW (ref 8.9–10.3)
Chloride: 112 mmol/L — ABNORMAL HIGH (ref 98–111)
Creatinine, Ser: 1.63 mg/dL — ABNORMAL HIGH (ref 0.61–1.24)
GFR calc Af Amer: 44 mL/min — ABNORMAL LOW (ref >60)
GFR calc non Af Amer: 38 mL/min — ABNORMAL LOW (ref >60)
Glucose, Bld: 73 mg/dL (ref 70–99)
Potassium: 4.5 mmol/L (ref 3.5–5.1)
Sodium: 143 mmol/L (ref 135–145)

## 2019-11-28 LAB — GLUCOSE, CAPILLARY
Glucose-Capillary: 106 mg/dL — ABNORMAL HIGH (ref 70–99)
Glucose-Capillary: 147 mg/dL — ABNORMAL HIGH (ref 70–99)
Glucose-Capillary: 65 mg/dL — ABNORMAL LOW (ref 70–99)
Glucose-Capillary: 81 mg/dL (ref 70–99)
Glucose-Capillary: 86 mg/dL (ref 70–99)
Glucose-Capillary: 94 mg/dL (ref 70–99)

## 2019-11-28 LAB — PROTIME-INR
INR: 5.3 (ref 0.8–1.2)
Prothrombin Time: 48.7 seconds — ABNORMAL HIGH (ref 11.4–15.2)

## 2019-11-28 LAB — PHOSPHORUS: Phosphorus: 4.8 mg/dL — ABNORMAL HIGH (ref 2.5–4.6)

## 2019-11-28 LAB — MAGNESIUM: Magnesium: 3.6 mg/dL — ABNORMAL HIGH (ref 1.7–2.4)

## 2019-11-28 LAB — PROCALCITONIN: Procalcitonin: >150 ng/mL

## 2019-11-28 MED ORDER — VITAL AF 1.2 CAL PO LIQD
1000.0000 mL | ORAL | Status: DC
  Administered 2019-11-28 – 2019-11-29 (×2): 1000 mL

## 2019-11-28 MED ORDER — POLYETHYLENE GLYCOL 3350 17 G PO PACK
17.0000 g | PACK | Freq: Once | ORAL | Status: AC
  Administered 2019-11-28: 17 g via ORAL
  Filled 2019-11-28: qty 1

## 2019-11-28 MED ORDER — SORBITOL 70 % SOLN
960.0000 mL | TOPICAL_OIL | Freq: Once | ORAL | Status: DC
  Filled 2019-11-28: qty 473

## 2019-11-28 MED ORDER — SORBITOL 70 % SOLN
30.0000 mL | Freq: Once | Status: AC
  Administered 2019-11-28: 30 mL
  Filled 2019-11-28: qty 30

## 2019-11-28 MED ORDER — PRO-STAT SUGAR FREE PO LIQD
30.0000 mL | Freq: Two times a day (BID) | ORAL | Status: DC
  Administered 2019-11-28 – 2019-11-30 (×5): 30 mL
  Filled 2019-11-28 (×5): qty 30

## 2019-11-28 MED ORDER — DEXTROSE-NACL 5-0.45 % IV SOLN: INTRAVENOUS | Status: DC

## 2019-11-28 MED ORDER — MAGNESIUM HYDROXIDE 400 MG/5ML PO SUSP
30.0000 mL | Freq: Once | ORAL | Status: AC
  Administered 2019-11-28: 17:00:00 30 mL
  Filled 2019-11-28: qty 30

## 2019-11-28 MED ORDER — LACTATED RINGERS IV SOLN: INTRAVENOUS | Status: AC

## 2019-11-28 MED ORDER — VITAMIN K1 1 MG/0.5ML IJ SOLN
1.0000 mg | Freq: Once | INTRAMUSCULAR | Status: AC
  Administered 2019-11-28: 1 mg via SUBCUTANEOUS
  Filled 2019-11-28: qty 0.5

## 2019-11-28 MED ORDER — SODIUM BICARBONATE 8.4 % IV SOLN
50.0000 meq | Freq: Once | INTRAVENOUS | Status: AC
  Administered 2019-11-28: 12:00:00 50 meq via INTRAVENOUS
  Filled 2019-11-28: qty 50

## 2019-11-28 MED ORDER — FAMOTIDINE IN NACL 20-0.9 MG/50ML-% IV SOLN
20.0000 mg | INTRAVENOUS | Status: DC
  Administered 2019-11-28: 20 mg via INTRAVENOUS
  Filled 2019-11-28: qty 50

## 2019-11-28 MED ORDER — POLYETHYLENE GLYCOL 3350 17 G PO PACK
17.0000 g | PACK | Freq: Every day | ORAL | Status: DC
  Administered 2019-11-28: 17 g
  Filled 2019-11-28: qty 1

## 2019-11-28 MED ORDER — DEXTROSE IN LACTATED RINGERS 5 % IV SOLN: INTRAVENOUS | Status: DC

## 2019-11-28 NOTE — Progress Notes (Signed)
eLink Physician-Brief Progress Note Patient Name: Cael Skuse DOB: 10-31-1930 MRN: [id number]   Date of Service  11/28/2019  HPI/Events of Note  Low blood sugar.  eICU Interventions  D 5 LR at 75 ml/ hour ordered.        Kerry Kass Damarco Keysor 11/28/2019, 3:50 AM

## 2019-11-28 NOTE — Progress Notes (Signed)
CRITICAL VALUE ALERT  Critical Value:  INR 5.3  Date & Time Notied:  11/28/19 0520  Provider Notified: Dr. Lucile Shutters  Orders Received/Actions taken: No new orders

## 2019-11-28 NOTE — Progress Notes (Signed)
Patient was transported to CT & back to 2H16 without any complications.

## 2019-11-28 NOTE — Progress Notes (Signed)
Provider notified of need to order SUP.

## 2019-11-28 NOTE — Progress Notes (Signed)
PT Cancellation Note  Patient Details Name: Teddy Heinsohn MRN: [id number] DOB: 05-28-1931   Cancelled Treatment:    Reason Eval/Treat Not Completed: Patient's level of consciousness. PT attempted to see patient for evaluation however pt is obtunded and unable to be aroused at this time to verbal and tactile cues, or to pain. PT will attempt to follow up as time allows.   Zenaida Niece 11/28/2019, 2:22 PM

## 2019-11-28 NOTE — Progress Notes (Addendum)
PULMONARY / CRITICAL CARE MEDICINE   NAME:  [name] [name], MRN:  [id number], DOB:  06/10/1931, LOS: 2 ADMISSION DATE:  12/08/2019, CONSULTATION DATE: 11/27/2019 REFERRING MD: Teaching service, CHIEF COMPLAINT: Failure to thrive acute respiratory distress metabolic acidosis  BRIEF HISTORY:    86 year old with end-stage dementia who with worsening metabolic acidosis respiratory distress intubated and transferred to intensive care unit.  Family is requesting full code of the very quickly approaching futile care. HISTORY OF PRESENT ILLNESS   86 year old male with a plethora of health issues that include to Alzheimer's disease with end-stage dementia, hypertension, prostate cancer, PE on Coumadin, chronic systolic heart failure, coronary artery disease, normocytic anemia, Parkinson's disease, diverticulosis, sores on buttocks, recurrent UTIs, hemorrhagic stroke with presumed sepsis plus or minus cardiogenic shock. 11/27/2019 pulmonary critical care called to bedside by teaching service.  Patient was noted to be in acute respiratory distress and was intermittently hypotensive.  Lactic acid is noted to be greater than 6.  Abdominal film from previous day revealed large stool burden.  Ongoing discussions with family concerning CODE STATUS with family requesting full CODE STATUS. The patient was transferred to the intensive care unit and was urgently intubated and central line placed.  He was unable to protect his airway.  At this time he remains a full code.  Again he has a plethora of health issues that will impede his recovery. SIGNIFICANT PAST MEDICAL HISTORY   End-stage stage dementia and Alzheimer's disease Coronary artery disease Pulmonary embolism Congestive heart failure Frequent urinary tract infection Generalized failure to thrive  SIGNIFICANT EVENTS:  11/27/1998 21-near code but did not lose pulse required urgent intubation STUDIES:   11/27/2018 one 2D echo> LVEF 35-40%. LV global hypokinesis   CULTURES:  11/27/2019 sputum culture> abundant GPC  3/8 BCx NG 2 days  ANTIBIOTICS:  11/28/2019 cefepime>>  LINES/TUBES:   11/27/2019 endotracheal tube>> 11/27/2019 right femoral A-line>>  11/27/2019 right femoral CVL>>  CONSULTANTS:  11/27/2019 pulmonary critical care Cardiology signed off 3/9 SUBJECTIVE:  Continues to have metabolic abnormalities New L gaze deviation Worsening abdominal distention  CONSTITUTIONAL: BP (!) 79/65   Pulse 98   Temp 99.1 F (37.3 C) (Oral)   Resp 20   Ht 5\' 8"  (1.727 m)   Wt 66.8 kg   SpO2 100%   BMI 22.39 kg/m   I/O last 3 completed shifts: In: 1484.6 [I.V.:584.5; IV Piggyback:900.1] Out: 650 [Urine:650]     Vent Mode: PRVC FiO2 (%):  [40 %-70 %] 40 % Set Rate:  [20 bmp] 20 bmp Vt Set:  [540 mL] 540 mL PEEP:  [5 cmH20] 5 cmH20 Plateau Pressure:  [10 cmH20-22 cmH20] 10 cmH20  PHYSICAL EXAM: General: 86 yo M chronically ill and frail appearing. Intubated and off sedation NAD  Neuro: L gaze deviation. Sluggish pupils. Winces to painful stimuli  HEENT: NCAT pink mm. ETT secure. Scant secretions. Cardiovascular: RRR s1s2. Cap refill  3 seconds. 1+ radial pulses  Lungs: Diminished bibasilar sounds. Scattered rhonchi. Symmetrical chest expansion Abdomen: Tense, distended. + bowel sounds  Musculoskeletal: Symmetrical muscle tone, no obvious joint deformity. No cyanosis or clubbing  Skin: Cool and dry  RESOLVED PROBLEM LIST   ASSESSMENT AND PLAN    Acute respiratory failure requiring intubation, in the setting of suspected aspiration, possible heart failure P Continue MV support, wean FiO2 PEEP as able VAP Low dose PRN fent for PAD Empiric cefepime   Acute encephalopathy on chronic dementia, Alzheimer, prior CVA -is responsive, interactive at baseline. Presently, has not had sedation >  12 hrs and is poorly responsive.  New L gaze deviation P CT Head STAT  History of coronary disease heart failure  P Cardiology has signed  off  Shock -likely septic component and ?hypovolemia. With LVEF 35% and LV hypokinesis, suspect cardiogenic component as well P Starting LR for IVF Continue NE for MAP goal > 65  On cefepime (GPC in tracheal aspirate) Repeating CT a/p with worsening distention, acidosis   Metabolic acidosis, partially compensated  -elevated lactic acid and persistent acidosis following amps of bicarb Abdominal distention-- 3/8 CT a/p  With large stool burden, no obstruction. With constellation of metabolic abnormalities concerning for new intraabdominal process. Note 3/9 documented tight, distended, tender abdomen  P repeating CT a/p  will try to further optimize vent for compensation likely needs repeat Bicarb push vs bicarb gtt   Acute renal insufficiency Trend renal indices, UOP MAP goal > 65  Avoid nephrotoxins  End-stage dementia with Alzheimer's and Parkinson's disease P Delirium precautions Minimize sedation   Coagulopathy, worsening despite held coumadin -INR 5.3 3/10 -History of pulmonary embolism on Coumadin with INR 2.539 2021  -? Hepatic dysfunction P Will give Vit K given increasing trend. Continue to hold warfarin Trend INR No anticoag at present, especially with CT H outstandign  Trend LFTs   Inadequate PO intake P EN per RDN if repeat CT a/p without acute process which would prohibit EN   Hypoglycemia -with worsening coagulopathy, concerning for budding hepatic dysfunction P continue D5 gtt dc SSI  may be able to dc d5 pending EN tolerance after CT a/p   Goals of Care  Patient has multiple chronic medical problems and is not communicative at baseline. His daughter is caregiver. She states he is interactive but non-verbal, frequently struggles with swallowing/choking  Family greatly struggling with present decompensation and with discussions RE goals of care. I will consult palliative care. CT H, CT a/p pending, both of which may be more revealing into this acute  decompensation.    Best Practice / Goals of Care / Disposition.   DVT PROPHYLAXIS: holding  SUP: PPI NUTRITION: En to start after repeat CT a/p  MOBILITY: Bedrest FAMILY DISCUSSIONS: I spoke with daughtera t bedside and son over phone 3/10. Family is very overwhelmed by present situation and changing status. I will also consult palliative care  DISPOSITIONICU   LABS  Glucose Recent Labs  Lab 11/27/19 2044 11/27/19 2349 11/27/19 2351 11/28/19 0026 11/28/19 0344 11/28/19 0732  GLUCAP 100* 46* 62* 147* 65* 81    BMET Recent Labs  Lab 11/27/19 0308 11/27/19 0952 11/27/19 1101 11/27/19 1215 11/28/19 0340  NA 140   < > 143 142 143  K 4.6   < > 4.4 4.3 4.5  CL 110  --  113*  --  112*  CO2 14*  --  15*  --  16*  BUN 32*  --  40*  --  56*  CREATININE 1.35*  --  1.66*  --  1.63*  GLUCOSE 145*  --  97  --  73   < > = values in this interval not displayed.    Liver Enzymes Recent Labs  Lab 12/05/2019 1136 11/27/19 1101  AST 32 39  ALT 10 11  ALKPHOS 42 33*  BILITOT 1.0 1.7*  ALBUMIN 3.2* 2.5*    Electrolytes Recent Labs  Lab 11/27/19 0308 11/27/19 1101 11/28/19 0340  CALCIUM 9.4 8.8* 8.7*  MG 3.5*  --  3.6*  PHOS  --   --  4.8*    CBC Recent Labs  Lab 11/27/19 0308 11/27/19 0952 11/27/19 1101 11/27/19 1215 11/28/19 0340  WBC 6.2  --  5.1  --  6.3  HGB 14.0   < > 13.7 14.3 14.4  HCT 43.6   < > 41.7 42.0 43.7  PLT 237  --  220  --  211   < > = values in this interval not displayed.    ABG Recent Labs  Lab 11/27/19 0952 11/27/19 1215  PHART 7.490* 7.403  PCO2ART 20.7* 26.1*  PO2ART 230.0* 133.0*    Coag's Recent Labs  Lab 11/27/2019 1136 11/27/19 1208 11/28/19 0340  INR 2.4* 3.0* 5.3*    Sepsis Markers Recent Labs  Lab 11/27/19 0525 11/27/19 0755 11/27/19 1101 11/28/19 0340  LATICACIDVEN 6.6* 5.9* 4.9*  --   PROCALCITON  --   --  >150.00 >150.00    Cardiac Enzymes No results for input(s): TROPONINI, PROBNP in the last 168  hours.  CRITICAL CARE Performed by: Cristal Generous   Total critical care time: 40 minutes  Critical care time was exclusive of separately billable procedures and treating other patients. Critical care was necessary to treat or prevent imminent or life-threatening deterioration.  Critical care was time spent personally by me on the following activities: development of treatment plan with patient and/or surrogate as well as nursing, discussions with consultants, evaluation of patient's response to treatment, examination of patient, obtaining history from patient or surrogate, ordering and performing treatments and interventions, ordering and review of laboratory studies, ordering and review of radiographic studies, pulse oximetry and re-evaluation of patient's condition.  Eliseo Gum MSN, AGACNP-BC [place] [id number] If no answer, [id number] 11/28/2019, 10:55 AM    PCCM attending:  This is an 86 year old gentleman with Alzheimer's disease, end-stage dementia, hypertension, PE on Coumadin chronic systolic heart failure coronary diseasePatient developed respiratory distress on hospital floor and was transferred to the intensive care unit.  Decision was made for endotracheal intubation.  Concern for decompensated heart failure versus aspiration pneumonia.  There has been ongoing goals of care discussion with patient's family.  At this time patient remains on mechanical support in the intensive care unit.  BP (!) 89/71   Pulse 95   Temp 99.1 F (37.3 C) (Oral)   Resp (!) 21   Ht 5\' 8"  (1.727 m)   Wt 66.8 kg   SpO2 100%   BMI 22.39 kg/m   General: Elderly male intubated on mechanical life support HEENT: Endotracheal tube in place Heart: Regular rhythm S1-S2 Lungs: Bilateral mechanical ventilated breath sounds Abdomen: Nondistended  Labs: Creatinine 1.63, potassium 4.5, procalcitonin greater than 150. Imaging: CT chest abdomen pelvis looking for  source of potential infection.  Patient does have severe stool impaction.  At risk for bacterial translocation. The patient's images have been independently reviewed by me  Assessment: Acute hypoxemic respiratory failure requiring intubation mechanical ventilation Acute encephalopathy baseline dementia, history of CVA This morning had leftward gaze deviation.  Decision made for CT head History of coronary disease Shock state, resolved, baseline depressed ejection fraction, chronic systolic heart failure Severe constipation, 16 cm stool ball, at risk for: Bacterial translocation Severely elevated procalcitonin concern for bacteremia Remains on broad-spectrum antibiotics Coagulopathy Lactic acidosis   Plan: Bowel regimen instituted Smog enema Continue cefepime Wean off of norepinephrine to maintain mean arterial pressure greater than 65 mmHg. Blood cultures pending May need to ask GI to see?    Garner Nash, DO [facility] 11/28/2019 3:14 PM

## 2019-11-28 NOTE — Progress Notes (Addendum)
Initial Nutrition Assessment  DOCUMENTATION CODES:   Not applicable  INTERVENTION:   Tube feeding:  -Vital AF 1.2 @ 20 ml/hr via OGT -Increase by 10 ml Q6 hours to goal rate of 50 ml/hr (1200 ml) -30 ml Prostat BID  At goal TF provides: 1640 kcals, 120 grams protein, 973 ml free water.   Monitor magnesium, potassium, and phosphorus daily for at least 3 days, MD to replete as needed, as pt is at risk for refeeding syndrome.   NUTRITION DIAGNOSIS:   Moderate Malnutrition related to chronic illness(dementia) as evidenced by mild fat depletion, moderate muscle depletion.  GOAL:   Patient will meet greater than or equal to 90% of their needs  MONITOR:   Weight trends, Labs, I & O's, Diet advancement, Vent status, Skin, TF tolerance  REASON FOR ASSESSMENT:   Ventilator    ASSESSMENT:   Patient with PMH significant for Alzheimer's disease with end-stage dementia, HTN, prostate cancer, CHF, CAD, Parkinson's disease, and hemorrhagic stroke. Presents this admission with sepsis likely 2/2 to UTI.   3/9- respiratory failure, intubated   Pt discussed during ICU rounds and with RN.   Has new L gaze deviation. Poorly responsive off sedation. Abdominal distention continues. CT shows stool burden. RD observed 200-300 ml of output from rectal tube. Okay to start feeding.   Per daughter, pt has baseline swallowing issues. May need to consider Cortrak if there are plans to extubate. Palliative to meet with family.   Weight shows to trend up over the last year. Utilize 66.8 kg as EDW for now.    Patient is currently intubated on ventilator support MV: 10.1 L/min Temp (24hrs), Avg:98.8 F (37.1 C), Min:98.7 F (37.1 C), Max:99.1 F (37.3 C)   I/O: +1,448 ml since admit  UOP: 650 ml x 24 hrs   Drips: D5 in 1/2 NS @ 75 ml/hr, levophed   Medications: Vitamin D, miralax Labs: Phosphorus 4.8 (H) Mg 3.6 (H) CBG 46-147  NUTRITION - FOCUSED PHYSICAL EXAM:    Most Recent Value   Orbital Region  No depletion  Upper Arm Region  Mild depletion  Thoracic and Lumbar Region  Unable to assess  Buccal Region  Mild depletion  Temple Region  Moderate depletion  Clavicle Bone Region  Mild depletion  Clavicle and Acromion Bone Region  Mild depletion  Scapular Bone Region  Unable to assess  Dorsal Hand  Moderate depletion  Patellar Region  Mild depletion  Anterior Thigh Region  Mild depletion  Posterior Calf Region  Mild depletion  Edema (RD Assessment)  Mild  Hair  Reviewed  Eyes  Unable to assess  Mouth  Unable to assess  Skin  Reviewed  Nails  Reviewed     Diet Order:   Diet Order            Diet NPO time specified  Diet effective now              EDUCATION NEEDS:   Not appropriate for education at this time  Skin:  Skin Assessment: Reviewed RN Assessment  Last BM:  3/9 via rectal tube  Height:   Ht Readings from Last 1 Encounters:  12/14/2019 5\' 8"  (1.727 m)    Weight:   Wt Readings from Last 1 Encounters:  11/28/19 66.8 kg    BMI:  Body mass index is 22.39 kg/m.  Estimated Nutritional Needs:   Kcal:  1566 kcal  Protein:  100-120 grams  Fluid:  >/= 1.5 L/day   [facility]  RD, LDN Clinical Nutrition Pager listed in [place]

## 2019-11-29 LAB — GLUCOSE, CAPILLARY
Glucose-Capillary: 105 mg/dL — ABNORMAL HIGH (ref 70–99)
Glucose-Capillary: 105 mg/dL — ABNORMAL HIGH (ref 70–99)
Glucose-Capillary: 109 mg/dL — ABNORMAL HIGH (ref 70–99)
Glucose-Capillary: 120 mg/dL — ABNORMAL HIGH (ref 70–99)
Glucose-Capillary: 150 mg/dL — ABNORMAL HIGH (ref 70–99)
Glucose-Capillary: 44 mg/dL — CL (ref 70–99)
Glucose-Capillary: 95 mg/dL (ref 70–99)

## 2019-11-29 LAB — BASIC METABOLIC PANEL
Anion gap: 13 (ref 5–15)
BUN: 57 mg/dL — ABNORMAL HIGH (ref 8–23)
CO2: 16 mmol/L — ABNORMAL LOW (ref 22–32)
Calcium: 8 mg/dL — ABNORMAL LOW (ref 8.9–10.3)
Chloride: 113 mmol/L — ABNORMAL HIGH (ref 98–111)
Creatinine, Ser: 1.36 mg/dL — ABNORMAL HIGH (ref 0.61–1.24)
GFR calc Af Amer: 54 mL/min — ABNORMAL LOW (ref >60)
GFR calc non Af Amer: 47 mL/min — ABNORMAL LOW (ref >60)
Glucose, Bld: 147 mg/dL — ABNORMAL HIGH (ref 70–99)
Potassium: 3.1 mmol/L — ABNORMAL LOW (ref 3.5–5.1)
Sodium: 142 mmol/L (ref 135–145)

## 2019-11-29 LAB — PROTIME-INR
INR: 5.9 (ref 0.8–1.2)
INR: 3 — ABNORMAL HIGH (ref 0.8–1.2)
Prothrombin Time: 53.2 seconds — ABNORMAL HIGH (ref 11.4–15.2)
Prothrombin Time: 31.1 seconds — ABNORMAL HIGH (ref 11.4–15.2)

## 2019-11-29 LAB — TYPE AND SCREEN
ABO/RH(D): A POS
Antibody Screen: NEGATIVE

## 2019-11-29 LAB — CBC
HCT: 33 % — ABNORMAL LOW (ref 39.0–52.0)
Hemoglobin: 10.9 g/dL — ABNORMAL LOW (ref 13.0–17.0)
MCH: 30.7 pg (ref 26.0–34.0)
MCHC: 33 g/dL (ref 30.0–36.0)
MCV: 93 fL (ref 80.0–100.0)
Platelets: 130 10*3/uL — ABNORMAL LOW (ref 150–400)
RBC: 3.55 MIL/uL — ABNORMAL LOW (ref 4.22–5.81)
RDW: 15.5 % (ref 11.5–15.5)
WBC: 3.5 10*3/uL — ABNORMAL LOW (ref 4.0–10.5)
nRBC: 0 % (ref 0.0–0.2)

## 2019-11-29 LAB — PROCALCITONIN: Procalcitonin: >150 ng/mL

## 2019-11-29 LAB — CULTURE, RESPIRATORY W GRAM STAIN: Culture: NORMAL

## 2019-11-29 MED ORDER — ACETAMINOPHEN 325 MG PO TABS: 650.0000 mg | ORAL_TABLET | Freq: Four times a day (QID) | ORAL | Status: DC | PRN

## 2019-11-29 MED ORDER — FAMOTIDINE 40 MG/5ML PO SUSR
20.0000 mg | Freq: Every day | ORAL | Status: DC
  Administered 2019-11-29: 20 mg
  Filled 2019-11-29: qty 2.5

## 2019-11-29 MED ORDER — ACETAMINOPHEN 650 MG RE SUPP: 650.0000 mg | Freq: Four times a day (QID) | RECTAL | Status: DC | PRN

## 2019-11-29 MED ORDER — SODIUM CHLORIDE 0.9% IV SOLUTION: Freq: Once | INTRAVENOUS | Status: AC

## 2019-11-29 NOTE — Progress Notes (Signed)
PULMONARY / CRITICAL CARE MEDICINE   NAME:  [name] [name], MRN:  [id number], DOB:  1931-05-15, LOS: 3 ADMISSION DATE:  12/10/2019, CONSULTATION DATE: 11/27/2019 REFERRING MD: Teaching service, CHIEF COMPLAINT: Failure to thrive acute respiratory distress metabolic acidosis  BRIEF HISTORY:    86 year old with end-stage dementia who with worsening metabolic acidosis respiratory distress intubated and transferred to intensive care unit.  Family is requesting full code of the very quickly approaching futile care. HISTORY OF PRESENT ILLNESS   86 year old male with a plethora of health issues that include to Alzheimer's disease with end-stage dementia, hypertension, prostate cancer, PE on Coumadin, chronic systolic heart failure, coronary artery disease, normocytic anemia, Parkinson's disease, diverticulosis, sores on buttocks, recurrent UTIs, hemorrhagic stroke with presumed sepsis plus or minus cardiogenic shock. 11/27/2019 pulmonary critical care called to bedside by teaching service.  Patient was noted to be in acute respiratory distress and was intermittently hypotensive.  Lactic acid is noted to be greater than 6.  Abdominal film from previous day revealed large stool burden.  Ongoing discussions with family concerning CODE STATUS with family requesting full CODE STATUS. The patient was transferred to the intensive care unit and was urgently intubated and central line placed.  He was unable to protect his airway.  At this time he remains a full code.  Again he has a plethora of health issues that will impede his recovery. SIGNIFICANT PAST MEDICAL HISTORY   End-stage stage dementia and Alzheimer's disease Coronary artery disease Pulmonary embolism Congestive heart failure Frequent urinary tract infection Generalized failure to thrive  SIGNIFICANT EVENTS:  11/27/1998 21-near code but did not lose pulse required urgent intubation STUDIES:   11/27/2018 one 2D echo> LVEF 35-40%. LV global hypokinesis   CULTURES:  11/27/2019 sputum culture> abundant GPC  3/8 BCx NG 2 days  ANTIBIOTICS:  12/05/2019 cefepime>>  LINES/TUBES:   11/27/2019 endotracheal tube>> 11/27/2019 right femoral A-line>>  11/27/2019 right femoral CVL>>  CONSULTANTS:  11/27/2019 pulmonary critical care Cardiology signed off 3/9 SUBJECTIVE:  Continues to have metabolic abnormalities New L gaze deviation Worsening abdominal distention  CONSTITUTIONAL: BP (!) 138/103   Pulse 82   Temp 98.9 F (37.2 C)   Resp 16   Ht 5\' 8"  (1.727 m)   Wt 67.1 kg   SpO2 100%   BMI 22.49 kg/m   I/O last 3 completed shifts: In: 3458.5 [I.V.:2806.1; NG/GT:302.3; IV Piggyback:350.1] Out: 2850 [Urine:1600; Stool:1250]     Vent Mode: PSV;CPAP FiO2 (%):  [40 %] 40 % Set Rate:  [24 bmp] 24 bmp Vt Set:  [540 mL] 540 mL PEEP:  [5 cmH20] 5 cmH20 Pressure Support:  [8 cmH20-15 cmH20] 8 cmH20 Plateau Pressure:  [16 [id number] cmH20] 18 cmH20  PHYSICAL EXAM: General: 86 year old, chronically ill, frail, cachectic temporalis muscle wasting intubated mechanical life support Neuro: Sluggish pupils, withdraws to pain, no interaction off sedation HEENT: Endotracheal tube in place Cardiovascular: Regular rate rhythm S1-S2 Lungs: Diminished bilaterally on mechanical support Abdomen: Nondistended  RESOLVED PROBLEM LIST   ASSESSMENT AND PLAN    Acute respiratory failure requiring intubation, in the setting of suspected aspiration, possible heart failure P Full mechanical support at this time Discussed prolonged mechanical support need with patient's family today at bedside. They are on having ongoing goals of care discussion at this time I believe the most appropriate next step is liberation from mechanical support for a one-way extubation and if patient does not recover we will transition to comfort care measures.  Acute encephalopathy on chronic dementia, Alzheimer, prior CVA -  is responsive, interactive at baseline. Presently, has not  had sedation > 12 hrs and is poorly responsive.  New L gaze deviation P Unfortunately I believe we are dealing with declining dementia.  Encephalopathy secondary to multiple medical conditions sepsis.  Unsure if he will ever be able to recover to his baseline  History of coronary disease heart failure   Shock -likely septic component and ?hypovolemia. With LVEF 35% and LV hypokinesis, suspect cardiogenic component as well P Able to wean off of vasopressors at this time Continue antimicrobials, on cefepime  Metabolic acidosis, partially compensated  -elevated lactic acid and persistent acidosis following amps of bicarb Abdominal distention-- 3/8 CT a/p  With large stool burden, no obstruction. With constellation of metabolic abnormalities concerning for new intraabdominal process. Note 3/9 documented tight, distended, tender abdomen  P Bowel regimen started, stool output better on with Flexi-Seal  Acute renal insufficiency Follow urine output Avoid nephrotoxins  End-stage dementia with Alzheimer's and Parkinson's disease P Delirium precautions Unfortunately this looks like end-stage disease baseline noncommunicative  Coagulopathy, worsening despite held coumadin -INR 5.3 3/10 -History of pulmonary embolism on Coumadin with INR 2.539 2021  -? Hepatic dysfunction P Dose of vitamin K, FFP Continue to hold warfarin  Inadequate PO intake P EN per RDN if repeat CT a/p without acute process which would prohibit EN   Hypoglycemia -with worsening coagulopathy, concerning for budding hepatic dysfunction P Better continue to observe  Goals of Care Ongoing multiple discussions with goals of care. I spoke with patient's daughter today at bedside   Best Practice / Goals of Care / Disposition.   DVT PROPHYLAXIS: holding  SUP: PPI NUTRITION: En to start after repeat CT a/p  MOBILITY: Bedrest FAMILY DISCUSSIONS: Daughter and caregiver very overwhelmed with situation at this time.   Long discussion at bedside DISPOSITIONICU   LABS  Glucose Recent Labs  Lab 11/28/19 1209 11/28/19 1550 11/28/19 2053 11/29/19 0021 11/29/19 0323 11/29/19 0826  GLUCAP 86 94 106* 105* 105* 150*    BMET Recent Labs  Lab 11/27/19 1101 11/27/19 1101 11/27/19 1215 11/28/19 0340 11/29/19 0431  NA 143   < > 142 143 142  K 4.4   < > 4.3 4.5 3.1*  CL 113*  --   --  112* 113*  CO2 15*  --   --  16* 16*  BUN 40*  --   --  56* 57*  CREATININE 1.66*  --   --  1.63* 1.36*  GLUCOSE 97  --   --  73 147*   < > = values in this interval not displayed.    Liver Enzymes Recent Labs  Lab 11/19/2019 1136 11/27/19 1101  AST 32 39  ALT 10 11  ALKPHOS 42 33*  BILITOT 1.0 1.7*  ALBUMIN 3.2* 2.5*    Electrolytes Recent Labs  Lab 11/27/19 0308 11/27/19 0308 11/27/19 1101 11/28/19 0340 11/29/19 0431  CALCIUM 9.4   < > 8.8* 8.7* 8.0*  MG 3.5*  --   --  3.6*  --   PHOS  --   --   --  4.8*  --    < > = values in this interval not displayed.    CBC Recent Labs  Lab 11/27/19 0308 11/27/19 0952 11/27/19 1101 11/27/19 1215 11/28/19 0340  WBC 6.2  --  5.1  --  6.3  HGB 14.0   < > 13.7 14.3 14.4  HCT 43.6   < > 41.7 42.0 43.7  PLT 237  --  220  --  211   < > = values in this interval not displayed.    ABG Recent Labs  Lab 11/27/19 0952 11/27/19 1215  PHART 7.490* 7.403  PCO2ART 20.7* 26.1*  PO2ART 230.0* 133.0*    Coag's Recent Labs  Lab 11/27/19 1208 11/28/19 0340 11/29/19 0431  INR 3.0* 5.3* 5.9*    Sepsis Markers Recent Labs  Lab 11/27/19 0525 11/27/19 0755 11/27/19 1101 11/28/19 0340 11/29/19 0431  LATICACIDVEN 6.6* 5.9* 4.9*  --   --   PROCALCITON  --   --  >150.00 >150.00 >150.00    Cardiac Enzymes No results for input(s): TROPONINI, PROBNP in the last 168 hours.   This patient is critically ill with multiple organ system failure; which, requires frequent high complexity decision making, assessment, support, evaluation, and titration of  therapies. This was completed through the application of advanced monitoring technologies and extensive interpretation of multiple databases. During this encounter critical care time was devoted to patient care services described in this note for 32 minutes.  Garner Nash, DO [facility] 11/29/2019 11:41 AM

## 2019-11-29 NOTE — Progress Notes (Signed)
2H Director, [employer], approved a one time visitor exception for patient's brother, Ilsa Iha to see the patient this afternoon. He is his only remaining sibling and traveled from [place].

## 2019-11-29 NOTE — Progress Notes (Signed)
eLink Physician-Brief Progress Note Patient Name: [name] [name] DOB: 29-Jun-1931 MRN: [id number]   Date of Service  11/29/2019  HPI/Events of Note  Coagulopathy - INR = 5.9.   eICU Interventions  Will order: 1. Transfuse 2 units FFP.  2. Repeat INR at 11 AM.      Intervention Category Major Interventions: Other:  [name] [name] 11/29/2019, 5:46 AM

## 2019-11-29 NOTE — Progress Notes (Signed)
PT Cancellation Note  Patient Details Name: Shykeim Sole MRN: [id number] DOB: [birth year]   Cancelled Treatment:    Reason Eval/Treat Not Completed: Patient's level of consciousness;Medical issues which prohibited therapy. Per RN pt is obtunded, unable to follow commands or respond to stimuli at this time. PT will attempt to follow up tomorrow, if patient remains unable to participate then PT will sign off.   Zenaida Niece 11/29/2019, 4:53 PM

## 2019-11-29 NOTE — Progress Notes (Signed)
Patient ID: [name] [name], male   DOB: [birth year], 86 y.o.   MRN: [id number]   This nurse practitioner spoke briefly with patient's daughter/[name] [name] this morning and a Goals of Care meeting was set for 10:45.  Daughter did arrive at the bedside but informed the patient's nurse that she did not want to speak with palliative medicine during this hospitalization.  Palliative medicine will sign off at this time.  Please reconsult if family reconsiders in the future.  Wadie Lessen NP # [phone]

## 2019-11-30 ENCOUNTER — Other Ambulatory Visit: Payer: Self-pay

## 2019-11-30 DIAGNOSIS — L8990 Pressure ulcer of unspecified site, unspecified stage: Secondary | ICD-10-CM | POA: Insufficient documentation

## 2019-11-30 DIAGNOSIS — Z789 Other specified health status: Secondary | ICD-10-CM

## 2019-11-30 LAB — BPAM FFP
Blood Product Expiration Date: 202103162359
Blood Product Expiration Date: 202103162359
Blood Product Expiration Date: 202103162359
Blood Product Expiration Date: 202103162359
ISSUE DATE / TIME: 202103110839
ISSUE DATE / TIME: 202103110839
Unit Type and Rh: 600
Unit Type and Rh: 6200
Unit Type and Rh: 6200
Unit Type and Rh: 6200

## 2019-11-30 LAB — PREPARE FRESH FROZEN PLASMA
Unit division: 0
Unit division: 0

## 2019-11-30 LAB — CBC
HCT: 32.3 % — ABNORMAL LOW (ref 39.0–52.0)
Hemoglobin: 10.7 g/dL — ABNORMAL LOW (ref 13.0–17.0)
MCH: 30.8 pg (ref 26.0–34.0)
MCHC: 33.1 g/dL (ref 30.0–36.0)
MCV: 93.1 fL (ref 80.0–100.0)
Platelets: 103 10*3/uL — ABNORMAL LOW (ref 150–400)
RBC: 3.47 MIL/uL — ABNORMAL LOW (ref 4.22–5.81)
RDW: 15.6 % — ABNORMAL HIGH (ref 11.5–15.5)
WBC: 3 10*3/uL — ABNORMAL LOW (ref 4.0–10.5)
nRBC: 0 % (ref 0.0–0.2)

## 2019-11-30 LAB — GLUCOSE, CAPILLARY
Glucose-Capillary: 115 mg/dL — ABNORMAL HIGH (ref 70–99)
Glucose-Capillary: 93 mg/dL (ref 70–99)
Glucose-Capillary: 99 mg/dL (ref 70–99)
Glucose-Capillary: 99 mg/dL (ref 70–99)

## 2019-11-30 LAB — PROTIME-INR
INR: 2.9 — ABNORMAL HIGH (ref 0.8–1.2)
Prothrombin Time: 30.4 seconds — ABNORMAL HIGH (ref 11.4–15.2)

## 2019-11-30 MED ORDER — DIPHENHYDRAMINE HCL 50 MG/ML IJ SOLN: 25.0000 mg | INTRAMUSCULAR | Status: DC | PRN

## 2019-11-30 MED ORDER — LORAZEPAM 2 MG/ML IJ SOLN
2.0000 mg | INTRAMUSCULAR | Status: DC | PRN
  Administered 2019-11-30 (×2): 2 mg via INTRAVENOUS
  Filled 2019-11-30 (×2): qty 1

## 2019-11-30 MED ORDER — MORPHINE 100MG IN NS 100ML (1MG/ML) PREMIX INFUSION
1.0000 mg/h | INTRAVENOUS | Status: DC
  Administered 2019-11-30 – 2019-12-01 (×2): 6 mg/h via INTRAVENOUS
  Filled 2019-11-30: qty 100

## 2019-11-30 MED ORDER — VITAMIN D 25 MCG (1000 UNIT) PO TABS
1000.0000 [IU] | ORAL_TABLET | Freq: Every day | ORAL | Status: DC
  Administered 2019-11-30: 11:00:00 1000 [IU]
  Filled 2019-11-30: qty 1

## 2019-11-30 MED ORDER — MORPHINE 100MG IN NS 100ML (1MG/ML) PREMIX INFUSION
1.0000 mg/h | INTRAVENOUS | Status: DC
  Administered 2019-11-30: 1 mg/h via INTRAVENOUS
  Filled 2019-11-30: qty 100

## 2019-11-30 MED ORDER — MORPHINE SULFATE (PF) 2 MG/ML IV SOLN
2.0000 mg | INTRAVENOUS | Status: DC | PRN
  Administered 2019-11-30 (×2): 2 mg via INTRAVENOUS
  Filled 2019-11-30 (×2): qty 1

## 2019-11-30 MED ORDER — ACETAMINOPHEN 650 MG RE SUPP: 650.0000 mg | Freq: Four times a day (QID) | RECTAL | Status: DC | PRN

## 2019-11-30 MED ORDER — POLYVINYL ALCOHOL 1.4 % OP SOLN
1.0000 [drp] | Freq: Four times a day (QID) | OPHTHALMIC | Status: DC | PRN
  Filled 2019-11-30: qty 15

## 2019-11-30 MED ORDER — MORPHINE SULFATE (PF) 2 MG/ML IV SOLN
2.0000 mg | INTRAVENOUS | Status: AC
  Administered 2019-11-30: 16:00:00 2 mg via INTRAVENOUS
  Filled 2019-11-30: qty 1

## 2019-11-30 MED ORDER — GLYCOPYRROLATE 0.2 MG/ML IJ SOLN: 0.2000 mg | INTRAMUSCULAR | Status: DC | PRN

## 2019-11-30 MED ORDER — GLYCOPYRROLATE 0.2 MG/ML IJ SOLN
0.2000 mg | INTRAMUSCULAR | Status: DC | PRN
  Administered 2019-11-30 – 2019-12-01 (×3): 0.2 mg via INTRAVENOUS
  Filled 2019-11-30 (×3): qty 1

## 2019-11-30 MED ORDER — GLYCOPYRROLATE 1 MG PO TABS
1.0000 mg | ORAL_TABLET | ORAL | Status: DC | PRN
  Filled 2019-11-30: qty 1

## 2019-11-30 NOTE — Progress Notes (Signed)
   11/30/19 1047  Clinical Encounter Type  Visited With Family  Visit Type Critical Care  Referral From Nurse  Consult/Referral To Chaplain  Spiritual Encounters  Spiritual Needs Prayer;Grief support  Stress Factors  Patient Stress Factors Health changes  Family Stress Factors Major life changes   Chaplain responded to consult for family support. Daughter [name] was in distress due to having difficult conversations with Rev. Ki's care team and the family around goals of care and end of life. [name] is experiencing a lot of guilt around her father's decline in health and end of life. Chaplain reassured [name] that she has given her father the best care possible and done all in her earthly power to keep Rev. Deni healthy. Our bodies unfortunately reach a point where they are too tired to continue the journey, and Rev. Bulmaro appears to be telling us his body is in that season of life. Chaplain remains available for support as needs arise.   Chaplain Resident, Evelene Croon, M Div [phone] On-call Pager

## 2019-11-30 NOTE — Progress Notes (Signed)
Patient extubated at 1405 , CCM withdrawal orders placed.

## 2019-11-30 NOTE — Progress Notes (Signed)
Patient appeared to be in discomfort, morphine titrated up to 6 ml/hr.

## 2019-11-30 NOTE — Progress Notes (Signed)
PT Cancellation Note  Patient Details Name: [name] [name] MRN: [id number] DOB: [birth year]   Cancelled Treatment:    Reason Eval/Treat Not Completed: Patient's level of consciousness;Medical issues which prohibited therapyPt remained intubated and minimally responsive without sedation. Not appropriate for PT services at this time. Will sign off for now. Please re consult when/if pt becomes appropriate for PT evaluation. Thanks.   Marguarite Arbour A Kineta Fudala 11/30/2019, 9:12 AM [name] [name], PT, DPT Acute Rehabilitation Services Pager [phone] Office [phone]

## 2019-11-30 NOTE — Progress Notes (Signed)
Patient's family made decision to withdraw and patient placed on comfort care approximately 1300.

## 2019-11-30 NOTE — Plan of Care (Signed)
Patient remains intubated.  Minimally responsive on no sedation.  Repositioned q2h.  Tube feeds increased toward goal.  Rectal tube and foley remain in place.  Fentanyl given PRN with good effect as when patient appears uncomfortable his blood pressure increases.  Problem: Education: Goal: Knowledge of General Education information will improve Description: Including pain rating scale, medication(s)/side effects and non-pharmacologic comfort measures Outcome: Not Progressing   Problem: Health Behavior/Discharge Planning: Goal: Ability to manage health-related needs will improve Outcome: Not Progressing   Problem: Clinical Measurements: Goal: Respiratory complications will improve Outcome: Not Progressing   Problem: Activity: Goal: Risk for activity intolerance will decrease Outcome: Not Progressing   Problem: Skin Integrity: Goal: Risk for impaired skin integrity will decrease Outcome: Not Progressing   Problem: Clinical Measurements: Goal: Diagnostic test results will improve Outcome: Not Progressing

## 2019-11-30 NOTE — Procedures (Signed)
Extubation Procedure Note  Patient Details:   Name: [name] [name] DOB: March 15, 1931 MRN: [id number]   Airway Documentation:    Vent end date: 11/30/19 Vent end time: 1406   Evaluation  O2 sats: stable throughout Complications: No apparent complications Patient did tolerate procedure well. Bilateral Breath Sounds: Rhonchi   No, pt could not speak post extubation.  Pt extubated to room air per physician's order in accordance with the family's wishes.  Family at bedside for extubation.  Earney Navy 11/30/2019, 2:07 PM

## 2019-11-30 NOTE — Progress Notes (Signed)
Spoke with RN Loma Sousa and was made aware that she is aware of the DC central line order

## 2019-11-30 NOTE — Progress Notes (Addendum)
PULMONARY / CRITICAL CARE MEDICINE   NAME:  [name] [name], MRN:  [id number], DOB:  [birth year], LOS: 4 ADMISSION DATE:  11/20/2019, CONSULTATION DATE: 11/27/2019 REFERRING MD: Teaching service, CHIEF COMPLAINT: Failure to thrive acute respiratory distress metabolic acidosis  BRIEF HISTORY:    86 year old with end-stage dementia who with worsening metabolic acidosis respiratory distress intubated and transferred to intensive care unit.  Family is requesting full code of the very quickly approaching futile care.  HISTORY OF PRESENT ILLNESS   86 year old male with a plethora of health issues that include to Alzheimer's disease with end-stage dementia, hypertension, prostate cancer, PE on Coumadin, chronic systolic heart failure, coronary artery disease, normocytic anemia, Parkinson's disease, diverticulosis, sores on buttocks, recurrent UTIs, hemorrhagic stroke with presumed sepsis plus or minus cardiogenic shock.  11/27/2019 pulmonary critical care called to bedside by teaching service.  Patient was noted to be in acute respiratory distress and was intermittently hypotensive.  Lactic acid is noted to be greater than 6.  Abdominal film from previous day revealed large stool burden.  Ongoing discussions with family concerning CODE STATUS with family requesting full CODE STATUS.  The patient was transferred to the intensive care unit and was urgently intubated and central line placed.  He was unable to protect his airway.  At this time he remains a full code.  Again he has a plethora of health issues that will impede his recovery.  SIGNIFICANT PAST MEDICAL HISTORY   End-stage stage dementia and Alzheimer's disease Coronary artery disease Pulmonary embolism Congestive heart failure Frequent urinary tract infection Generalized failure to thrive  SIGNIFICANT EVENTS:  11/27/1998 21-near code but did not lose pulse required urgent intubation  STUDIES:   11/27/2018 one 2D echo> LVEF 35-40%. LV global  hypokinesis   11/28/2019 CT Chest , ABD/pelvis >   1. Small bilateral pleural effusions and associated very dense atelectasis or consolidation of the lower lobes with air bronchograms, increased compared to prior CT of the abdomen and pelvis dated 12/13/2019. 2. Very large burden of stool in the distal colon and rectum, a large stool ball in the rectum measuring at least 16 x 9 x 9 cm, unchanged compared to prior examination and concerning for fecal impaction. 3. Anasarca. Small volume ascites. 4. Support apparatus including endotracheal tube, esophagogastric tube, and right femoral arterial and venous catheters in satisfactory position. 5. Coronary artery disease.  Aortic Atherosclerosis (ICD10-I70.0).  11/28/2019 Head CT > No acute intracranial findings.  CULTURES:  11/27/2019 sputum culture > negative  3/8 BCx >   ANTIBIOTICS:  11/21/2019 cefepime>>  LINES/TUBES:  11/27/2019 endotracheal tube>> 11/27/2019 right femoral A-line>> 11/27/2019 right femoral CVL>>  CONSULTANTS:  11/27/2019 pulmonary critical care Cardiology signed off 3/9  SUBJECTIVE:  RN reports intermittent hypertension with movement but self resolves.   CONSTITUTIONAL: BP (!) 189/99   Pulse 78   Temp 98.1 F (36.7 C) (Oral)   Resp 12   Ht 5\' 8"  (1.727 m)   Wt 67.1 kg   SpO2 97%   BMI 22.49 kg/m   I/O last 3 completed shifts: In: 3440.1 [I.V.:1539.1; Blood:496; Other:45; NG/GT:1060; IV [id number] Out: 3000 [Urine:1550; Stool:1450]     Vent Mode: PRVC FiO2 (%):  [40 %] 40 % Set Rate:  [24 bmp] 24 bmp Vt Set:  [540 mL] 540 mL PEEP:  [5 cmH20] 5 cmH20 Pressure Support:  [8 cmH20] 8 cmH20 Plateau Pressure:  [20 [id number] cmH20] 22 cmH20  PHYSICAL EXAM: General: Chronically ill appearing elderly male on mechanical ventilation, in NAD HEENT: ETT,  MM pink/moist, PERRL,  Neuro: Unresponsive on no sedation, left gaze preference  CV: s1s2 regular rate and rhythm, no murmur, rubs, or gallops,  PULM:   Clear to ascultation, no added breath sounds, no increased work of breathing  GI: soft, bowel sounds active in all 4 quadrants, non-tender, non-distended, tolerating TF Extremities: warm/dry, no edema  Skin: no rashes or lesions   RESOLVED PROBLEM LIST   ASSESSMENT AND PLAN    Acute respiratory failure requiring intubation -in the setting of suspected aspiration, possible heart failure P: Continue ventilator support with lung protective strategies  Currently tolerating SBT but mentation is preventing safe extubation  Wean PEEP and FiO2 for sats greater than 90%. Head of bed elevated 30 degrees. Plateau pressures less than 30 cm H20.  Follow intermittent chest x-ray and ABG.   Ensure adequate pulmonary hygiene  Follow cultures  VAP bundle in place  PAD protocol  Acute encephalopathy on chronic dementia, Alzheimer, prior CVA -is responsive, interactive at baseline. Presently, has not had sedation > 24 hrs and is poorly responsive.  New L gaze deviation -Unfortunately I believe we are dealing with declining dementia.  Encephalopathy secondary to multiple medical conditions sepsis.  Unsure if he will ever be able to recover to his baseline End-stage dementia with Alzheimer's and Parkinson's disease P: Supportive care  Delirium precautions  Continue goals of care discussion with family   History of coronary disease heart failure  Shock -likely septic component and ?hypovolemia. With LVEF 35% and LV hypokinesis, suspect cardiogenic component as well P: Remains off vasopressors  Continue IV antibiotics  Monitor in the ICU setting   Metabolic acidosis, partially compensated  -elevated lactic acid and persistent acidosis following amps of bicarb Abdominal distention- - 3/8 CT a/p  With large stool burden, no obstruction. With constellation of metabolic abnormalities concerning for new intraabdominal process. Note 3/9 documented tight, distended, tender abdomen  P: Continue bowel  regiment   Acute renal insufficiency, improving  P: Follow renal function / urine output Trend Bmet Avoid nephrotoxins, ensure adequate renal perfusion  Avoid nephrotoxins  Coagulopathy -History of pulmonary embolism on Coumadin with INR 2.539 2021  -? Hepatic dysfunction P: Continue to follow INR Hold Warfarin   Moderate malnutrition  - As evidence by mild fat depletion and moderate muscle depletion  P: Continue TF  Dietitian following   Hypoglycemia -with worsening coagulopathy, concerning for budding hepatic dysfunction P Conitnue to monitor   Goals of Care Long discussion again held with daughter at bedside, family is actively considering compassionate extubation. All siblings are in agreement, awaiting final decision and family presence prior to extubation    Best Practice / Goals of Care / Disposition.   DVT PROPHYLAXIS: holding  SUP: PPI NUTRITION: En to start after repeat CT a/p  MOBILITY: Bedrest FAMILY DISCUSSIONS: Daughter and caregiver very overwhelmed with situation at this time.  Long discussion at bedside DISPOSITIONICU   LABS  Glucose Recent Labs  Lab 11/29/19 1150 11/29/19 1554 11/29/19 2014 11/30/19 0022 11/30/19 0412 11/30/19 0753  GLUCAP 109* 120* 95 99 93 115*    BMET Recent Labs  Lab 11/27/19 1101 11/27/19 1101 11/27/19 1215 11/28/19 0340 11/29/19 0431  NA 143   < > 142 143 142  K 4.4   < > 4.3 4.5 3.1*  CL 113*  --   --  112* 113*  CO2 15*  --   --  16* 16*  BUN 40*  --   --  56* 57*  CREATININE  1.66*  --   --  1.63* 1.36*  GLUCOSE 97  --   --  73 147*   < > = values in this interval not displayed.    Liver Enzymes Recent Labs  Lab 11/25/2019 1136 11/27/19 1101  AST 32 39  ALT 10 11  ALKPHOS 42 33*  BILITOT 1.0 1.7*  ALBUMIN 3.2* 2.5*    Electrolytes Recent Labs  Lab 11/27/19 0308 11/27/19 0308 11/27/19 1101 11/28/19 0340 11/29/19 0431  CALCIUM 9.4   < > 8.8* 8.7* 8.0*  MG 3.5*  --   --  3.6*  --   PHOS   --   --   --  4.8*  --    < > = values in this interval not displayed.    CBC Recent Labs  Lab 11/28/19 0340 11/29/19 1324 11/30/19 0413  WBC 6.3 3.5* 3.0*  HGB 14.4 10.9* 10.7*  HCT 43.7 33.0* 32.3*  PLT 211 130* 103*    ABG Recent Labs  Lab 11/27/19 0952 11/27/19 1215  PHART 7.490* 7.403  PCO2ART 20.7* 26.1*  PO2ART 230.0* 133.0*    Coag's Recent Labs  Lab 11/29/19 0431 11/29/19 1154 11/30/19 0413  INR 5.9* 3.0* 2.9*    Sepsis Markers Recent Labs  Lab 11/27/19 0525 11/27/19 0755 11/27/19 1101 11/28/19 0340 11/29/19 0431  LATICACIDVEN 6.6* 5.9* 4.9*  --   --   PROCALCITON  --   --  >150.00 >150.00 >150.00    Cardiac Enzymes No results for input(s): TROPONINI, PROBNP in the last 168 hours.  CRITICAL CARE Performed by: Johnsie Cancel  Total critical care time: 40 minutes  Critical care time was exclusive of separately billable procedures and treating other patients.  Critical care was necessary to treat or prevent imminent or life-threatening deterioration.  Critical care was time spent personally by me on the following activities: development of treatment plan with patient and/or surrogate as well as nursing, discussions with consultants, evaluation of patient's response to treatment, examination of patient, obtaining history from patient or surrogate, ordering and performing treatments and interventions, ordering and review of laboratory studies, ordering and review of radiographic studies, pulse oximetry and re-evaluation of patient's condition.  Johnsie Cancel, NP-C [facility] Contact / Pager information can be found on Amion  11/30/2019, 12:41 PM   PCCM Attending:  86 year old male, nonverbal, advanced dementia, Parkinson disease at baseline.  Patient intubated for acute hypoxic respiratory failure, aspiration pneumonia sepsis UTI.  Long discussion with patient's daughter via phone and other daughter in person.   Decision made for comfort care measures.  Possible withdrawal of care later this afternoon or tomorrow pending on appropriate time with family.  [facility] 11/30/2019 4:16 PM

## 2019-11-30 NOTE — Progress Notes (Signed)
eLink Physician-Brief Progress Note Patient Name: [name] [name] DOB: [birth year] MRN: [id number]   Date of Service  11/30/2019  HPI/Events of Note  Patient on comfort care morphine orders have expired.   eICU Interventions  Will reorder: 1. Morphine IV infusion. Titrate to comfort.      Intervention Category Major Interventions: Other:  Lysle Dingwall 11/30/2019, 7:50 PM

## 2019-11-30 NOTE — Progress Notes (Signed)
   11/30/19 1440  Clinical Encounter Type  Visited With Family  Visit Type Follow-up;Patient actively dying  Referral From Nurse  Consult/Referral To Chaplain  Spiritual Encounters  Spiritual Needs Grief support  Stress Factors  Patient Stress Factors Health changes  Family Stress Factors Major life changes   Chaplain responded to consult for transition to comfort care. Family at bedside affirmed to Chaplain that they were fine and thanked Chaplain for checking on them. Daughter [name] was in the [place] waiting area due to overwhelming emotions. Chaplain provided ministry of presence and a snack for [employer]. Chaplain remains available for support as needs arise.   Chaplain Resident, Evelene Croon, M Div [phone] on-call pager

## 2019-11-30 NOTE — Progress Notes (Signed)
   11/30/19 1111  Clinical Encounter Type  Visited With Health care provider;Patient;Family  Visit Type Follow-up  Referral From Nurse  Consult/Referral To Chaplain  Spiritual Encounters  Spiritual Needs Grief support  Stress Factors  Patient Stress Factors Health changes  Family Stress Factors Major life changes   Chaplain responded to page for family support. RN informed Chaplain that Rev. Torrez was actually initiating some breaths on his own and was working on weaning from the vent. RN requested Chaplain share the news with daughter Roney Jaffe did. Chaplain prayed with Rev. Nickolis. Chaplain escorted [name] to Rev. Staci's room. Chaplain encouraged [name] to play music for Rev. Sulayman. Chaplain remains available for support as needs arise.   Chaplain Resident, Evelene Croon, M Div [phone] on-call pager

## 2019-12-01 DIAGNOSIS — G308 Other Alzheimer's disease: Secondary | ICD-10-CM

## 2019-12-01 LAB — CULTURE, BLOOD (ROUTINE X 2)
Culture: NO GROWTH
Culture: NO GROWTH

## 2019-12-03 LAB — CULTURE, BLOOD (ROUTINE X 2)
Culture: NO GROWTH
Culture: NO GROWTH

## 2019-12-05 ENCOUNTER — Ambulatory Visit (HOSPITAL_COMMUNITY): Payer: Medicare HMO

## 2019-12-05 ENCOUNTER — Encounter (HOSPITAL_COMMUNITY): Payer: Medicare HMO

## 2019-12-20 NOTE — Progress Notes (Signed)
Patient's rhythm became asystole, pronounced deceased at 36 with Dario Guardian RN.

## 2019-12-20 NOTE — Discharge Summary (Signed)
Date of Admission: 11/20/2019 Date of Death: 2019/12/16 Diagnoses: End stage dementia, suspected aspiration pneumonia Hospital Course: Patient was admitted to ICU for worsening respiratory distress and metabolic acidosis, emergently intubated.  After discussion with family regarding patient wishes (given baseline bed sores, non-communicative dementia, recurrent sepsis, multiorgan dysfunction) she was transitioned to comfort care on morphine drip and passed away peacefully with family at bedside.  Erskine Emery MD PCCM

## 2019-12-20 NOTE — Progress Notes (Signed)
PCCM Note  S: Seen in f/u for end of life care. Morphine at 6mg /hr. Appears comfortable. MAPs in 43s  O: Elderly man unresponsive in NAD RR ~15 Lungs scattered rhonci  A: End stage dementia End of life care  P: Continue morphine titrated to comfort Death expected within hours Family updated at bedside.  Erskine Emery MD PCCM

## 2019-12-20 DEATH — deceased

## 2020-02-20 ENCOUNTER — Ambulatory Visit: Payer: Medicare HMO | Admitting: Adult Health
